# Patient Record
Sex: Female | Born: 1975
Health system: Southern US, Community
[De-identification: ages and names within clinical notes are randomized; demographics above are authoritative.]

## PROBLEM LIST (undated history)

## (undated) DIAGNOSIS — L0292 Furuncle, unspecified: Secondary | ICD-10-CM

## (undated) DIAGNOSIS — Z860100 Personal history of colon polyps, unspecified: Secondary | ICD-10-CM

## (undated) DIAGNOSIS — T753XXA Motion sickness, initial encounter: Secondary | ICD-10-CM

## (undated) DIAGNOSIS — E669 Obesity, unspecified: Secondary | ICD-10-CM

## (undated) DIAGNOSIS — M1990 Unspecified osteoarthritis, unspecified site: Secondary | ICD-10-CM

## (undated) DIAGNOSIS — R42 Dizziness and giddiness: Secondary | ICD-10-CM

## (undated) DIAGNOSIS — Z8669 Personal history of other diseases of the nervous system and sense organs: Secondary | ICD-10-CM

## (undated) DIAGNOSIS — K648 Other hemorrhoids: Secondary | ICD-10-CM

## (undated) DIAGNOSIS — Z9289 Personal history of other medical treatment: Secondary | ICD-10-CM

## (undated) DIAGNOSIS — R110 Nausea: Secondary | ICD-10-CM

## (undated) DIAGNOSIS — Z87440 Personal history of urinary (tract) infections: Secondary | ICD-10-CM

## (undated) DIAGNOSIS — R34 Anuria and oliguria: Secondary | ICD-10-CM

## (undated) DIAGNOSIS — J189 Pneumonia, unspecified organism: Secondary | ICD-10-CM

## (undated) DIAGNOSIS — F419 Anxiety disorder, unspecified: Secondary | ICD-10-CM

## (undated) DIAGNOSIS — K289 Gastrojejunal ulcer, unspecified as acute or chronic, without hemorrhage or perforation: Secondary | ICD-10-CM

## (undated) DIAGNOSIS — R112 Nausea with vomiting, unspecified: Secondary | ICD-10-CM

## (undated) DIAGNOSIS — R519 Headache, unspecified: Secondary | ICD-10-CM

## (undated) DIAGNOSIS — M2550 Pain in unspecified joint: Secondary | ICD-10-CM

## (undated) DIAGNOSIS — K449 Diaphragmatic hernia without obstruction or gangrene: Secondary | ICD-10-CM

## (undated) DIAGNOSIS — K602 Anal fissure, unspecified: Secondary | ICD-10-CM

## (undated) DIAGNOSIS — J45909 Unspecified asthma, uncomplicated: Secondary | ICD-10-CM

## (undated) DIAGNOSIS — A498 Other bacterial infections of unspecified site: Secondary | ICD-10-CM

## (undated) DIAGNOSIS — K5150 Left sided colitis without complications: Secondary | ICD-10-CM

## (undated) DIAGNOSIS — G5601 Carpal tunnel syndrome, right upper limb: Secondary | ICD-10-CM

## (undated) DIAGNOSIS — J40 Bronchitis, not specified as acute or chronic: Secondary | ICD-10-CM

## (undated) DIAGNOSIS — K5190 Ulcerative colitis, unspecified, without complications: Secondary | ICD-10-CM

## (undated) DIAGNOSIS — R51 Headache: Secondary | ICD-10-CM

## (undated) DIAGNOSIS — E860 Dehydration: Secondary | ICD-10-CM

## (undated) DIAGNOSIS — K644 Residual hemorrhoidal skin tags: Secondary | ICD-10-CM

## (undated) DIAGNOSIS — D509 Iron deficiency anemia, unspecified: Secondary | ICD-10-CM

## (undated) DIAGNOSIS — Z8709 Personal history of other diseases of the respiratory system: Secondary | ICD-10-CM

## (undated) DIAGNOSIS — Z9884 Bariatric surgery status: Secondary | ICD-10-CM

## (undated) DIAGNOSIS — F329 Major depressive disorder, single episode, unspecified: Secondary | ICD-10-CM

## (undated) DIAGNOSIS — Z9889 Other specified postprocedural states: Secondary | ICD-10-CM

## (undated) DIAGNOSIS — Z8719 Personal history of other diseases of the digestive system: Secondary | ICD-10-CM

## (undated) DIAGNOSIS — Z8489 Family history of other specified conditions: Secondary | ICD-10-CM

## (undated) DIAGNOSIS — K219 Gastro-esophageal reflux disease without esophagitis: Secondary | ICD-10-CM

## (undated) DIAGNOSIS — K529 Noninfective gastroenteritis and colitis, unspecified: Secondary | ICD-10-CM

## (undated) DIAGNOSIS — Z8711 Personal history of peptic ulcer disease: Secondary | ICD-10-CM

## (undated) DIAGNOSIS — R202 Paresthesia of skin: Secondary | ICD-10-CM

## (undated) DIAGNOSIS — G43909 Migraine, unspecified, not intractable, without status migrainosus: Secondary | ICD-10-CM

## (undated) DIAGNOSIS — M2540 Effusion, unspecified joint: Secondary | ICD-10-CM

## (undated) DIAGNOSIS — F17200 Nicotine dependence, unspecified, uncomplicated: Secondary | ICD-10-CM

## (undated) DIAGNOSIS — F32A Depression, unspecified: Secondary | ICD-10-CM

## (undated) DIAGNOSIS — R251 Tremor, unspecified: Secondary | ICD-10-CM

## (undated) DIAGNOSIS — G629 Polyneuropathy, unspecified: Secondary | ICD-10-CM

## (undated) HISTORY — DX: Obesity, unspecified: E66.9

## (undated) HISTORY — DX: Anal fissure, unspecified: K60.2

## (undated) HISTORY — DX: Unspecified osteoarthritis, unspecified site: M19.90

## (undated) HISTORY — DX: Other bacterial infections of unspecified site: A49.8

## (undated) HISTORY — PX: HEMORRHOIDECTOMY WITH HEMORRHOID BANDING: SHX5633

## (undated) HISTORY — PX: ABDOMINAL HYSTERECTOMY: SHX81

## (undated) HISTORY — DX: Tremor, unspecified: R25.1

## (undated) HISTORY — DX: Iron deficiency anemia, unspecified: D50.9

## (undated) HISTORY — DX: Noninfective gastroenteritis and colitis, unspecified: K52.9

## (undated) HISTORY — DX: Left sided colitis without complications: K51.50

## (undated) HISTORY — DX: Headache: R51

## (undated) HISTORY — DX: Migraine, unspecified, not intractable, without status migrainosus: G43.909

## (undated) HISTORY — DX: Nicotine dependence, unspecified, uncomplicated: F17.200

## (undated) HISTORY — PX: TUBAL LIGATION: SHX77

## (undated) HISTORY — PX: DILATION AND CURETTAGE OF UTERUS: SHX78

## (undated) HISTORY — PX: OTHER SURGICAL HISTORY: SHX169

## (undated) HISTORY — PX: FOOT SURGERY: SHX648

## (undated) HISTORY — DX: Gastrojejunal ulcer, unspecified as acute or chronic, without hemorrhage or perforation: K28.9

---
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
## 2000-12-01 ENCOUNTER — Emergency Department (HOSPITAL_COMMUNITY): Admission: EM | Admit: 2000-12-01 | Discharge: 2000-12-01 | Payer: Self-pay | Admitting: *Deleted

## 2001-05-27 ENCOUNTER — Other Ambulatory Visit: Admission: RE | Admit: 2001-05-27 | Discharge: 2001-05-27 | Payer: Self-pay | Admitting: Family Medicine

## 2001-05-30 ENCOUNTER — Emergency Department (HOSPITAL_COMMUNITY): Admission: EM | Admit: 2001-05-30 | Discharge: 2001-05-31 | Payer: Self-pay | Admitting: Emergency Medicine

## 2001-12-13 ENCOUNTER — Other Ambulatory Visit: Admission: RE | Admit: 2001-12-13 | Discharge: 2001-12-13 | Payer: Self-pay | Admitting: Obstetrics and Gynecology

## 2002-07-01 ENCOUNTER — Emergency Department (HOSPITAL_COMMUNITY): Admission: EM | Admit: 2002-07-01 | Discharge: 2002-07-01 | Payer: Self-pay

## 2002-12-22 ENCOUNTER — Emergency Department (HOSPITAL_COMMUNITY): Admission: EM | Admit: 2002-12-22 | Discharge: 2002-12-22 | Payer: Self-pay

## 2003-08-24 ENCOUNTER — Emergency Department (HOSPITAL_COMMUNITY): Admission: AD | Admit: 2003-08-24 | Discharge: 2003-08-24 | Payer: Self-pay | Admitting: Family Medicine

## 2003-11-12 ENCOUNTER — Emergency Department (HOSPITAL_COMMUNITY): Admission: EM | Admit: 2003-11-12 | Discharge: 2003-11-12 | Payer: Self-pay | Admitting: Emergency Medicine

## 2003-11-15 ENCOUNTER — Encounter: Admission: RE | Admit: 2003-11-15 | Discharge: 2003-11-15 | Payer: Self-pay | Admitting: General Practice

## 2003-11-20 ENCOUNTER — Encounter: Admission: RE | Admit: 2003-11-20 | Discharge: 2003-12-07 | Payer: Self-pay | Admitting: General Practice

## 2004-02-18 ENCOUNTER — Ambulatory Visit (HOSPITAL_COMMUNITY): Admission: RE | Admit: 2004-02-18 | Discharge: 2004-02-18 | Payer: Self-pay | Admitting: Obstetrics and Gynecology

## 2004-02-26 ENCOUNTER — Ambulatory Visit (HOSPITAL_COMMUNITY): Admission: RE | Admit: 2004-02-26 | Discharge: 2004-02-26 | Payer: Self-pay | Admitting: Obstetrics and Gynecology

## 2004-03-04 ENCOUNTER — Inpatient Hospital Stay (HOSPITAL_COMMUNITY): Admission: AD | Admit: 2004-03-04 | Discharge: 2004-03-04 | Payer: Self-pay | Admitting: Obstetrics and Gynecology

## 2004-05-05 ENCOUNTER — Ambulatory Visit (HOSPITAL_COMMUNITY): Admission: RE | Admit: 2004-05-05 | Discharge: 2004-05-05 | Payer: Self-pay | Admitting: Obstetrics and Gynecology

## 2004-07-18 ENCOUNTER — Inpatient Hospital Stay (HOSPITAL_COMMUNITY): Admission: AD | Admit: 2004-07-18 | Discharge: 2004-07-18 | Payer: Self-pay | Admitting: Obstetrics and Gynecology

## 2004-07-22 ENCOUNTER — Inpatient Hospital Stay (HOSPITAL_COMMUNITY): Admission: AD | Admit: 2004-07-22 | Discharge: 2004-07-22 | Payer: Self-pay | Admitting: *Deleted

## 2004-08-12 ENCOUNTER — Inpatient Hospital Stay (HOSPITAL_COMMUNITY): Admission: AD | Admit: 2004-08-12 | Discharge: 2004-08-12 | Payer: Self-pay | Admitting: Obstetrics and Gynecology

## 2004-09-22 ENCOUNTER — Inpatient Hospital Stay (HOSPITAL_COMMUNITY): Admission: AD | Admit: 2004-09-22 | Discharge: 2004-09-22 | Payer: Self-pay | Admitting: Obstetrics and Gynecology

## 2004-09-26 ENCOUNTER — Inpatient Hospital Stay (HOSPITAL_COMMUNITY): Admission: AD | Admit: 2004-09-26 | Discharge: 2004-09-28 | Payer: Self-pay | Admitting: Obstetrics and Gynecology

## 2004-10-05 ENCOUNTER — Inpatient Hospital Stay (HOSPITAL_COMMUNITY): Admission: AD | Admit: 2004-10-05 | Discharge: 2004-10-05 | Payer: Self-pay | Admitting: Obstetrics and Gynecology

## 2004-10-12 ENCOUNTER — Emergency Department (HOSPITAL_COMMUNITY): Admission: EM | Admit: 2004-10-12 | Discharge: 2004-10-12 | Payer: Self-pay | Admitting: Family Medicine

## 2004-10-13 ENCOUNTER — Inpatient Hospital Stay (HOSPITAL_COMMUNITY): Admission: AD | Admit: 2004-10-13 | Discharge: 2004-10-16 | Payer: Self-pay | Admitting: Obstetrics & Gynecology

## 2004-10-18 ENCOUNTER — Observation Stay (HOSPITAL_COMMUNITY): Admission: AD | Admit: 2004-10-18 | Discharge: 2004-10-18 | Payer: Self-pay | Admitting: General Surgery

## 2004-10-18 ENCOUNTER — Encounter (INDEPENDENT_AMBULATORY_CARE_PROVIDER_SITE_OTHER): Payer: Self-pay | Admitting: *Deleted

## 2004-10-19 ENCOUNTER — Emergency Department (HOSPITAL_COMMUNITY): Admission: EM | Admit: 2004-10-19 | Discharge: 2004-10-19 | Payer: Self-pay | Admitting: Emergency Medicine

## 2005-06-26 ENCOUNTER — Inpatient Hospital Stay (HOSPITAL_COMMUNITY): Admission: AD | Admit: 2005-06-26 | Discharge: 2005-06-26 | Payer: Self-pay | Admitting: *Deleted

## 2005-08-18 ENCOUNTER — Other Ambulatory Visit: Admission: RE | Admit: 2005-08-18 | Discharge: 2005-08-18 | Payer: Self-pay | Admitting: Obstetrics and Gynecology

## 2005-09-28 HISTORY — PX: COLONOSCOPY: SHX174

## 2005-10-06 ENCOUNTER — Inpatient Hospital Stay (HOSPITAL_COMMUNITY): Admission: AD | Admit: 2005-10-06 | Discharge: 2005-10-06 | Payer: Self-pay | Admitting: *Deleted

## 2006-01-18 ENCOUNTER — Inpatient Hospital Stay (HOSPITAL_COMMUNITY): Admission: AD | Admit: 2006-01-18 | Discharge: 2006-01-18 | Payer: Self-pay | Admitting: Obstetrics and Gynecology

## 2006-02-05 ENCOUNTER — Inpatient Hospital Stay (HOSPITAL_COMMUNITY): Admission: AD | Admit: 2006-02-05 | Discharge: 2006-02-05 | Payer: Self-pay | Admitting: Obstetrics and Gynecology

## 2006-02-21 ENCOUNTER — Inpatient Hospital Stay (HOSPITAL_COMMUNITY): Admission: AD | Admit: 2006-02-21 | Discharge: 2006-02-21 | Payer: Self-pay | Admitting: Gynecology

## 2006-02-28 ENCOUNTER — Inpatient Hospital Stay (HOSPITAL_COMMUNITY): Admission: AD | Admit: 2006-02-28 | Discharge: 2006-03-02 | Payer: Self-pay | Admitting: Obstetrics and Gynecology

## 2006-06-01 ENCOUNTER — Ambulatory Visit: Payer: Self-pay | Admitting: Internal Medicine

## 2006-06-14 ENCOUNTER — Ambulatory Visit: Payer: Self-pay | Admitting: Internal Medicine

## 2006-06-14 ENCOUNTER — Encounter (INDEPENDENT_AMBULATORY_CARE_PROVIDER_SITE_OTHER): Payer: Self-pay | Admitting: Specialist

## 2007-01-02 ENCOUNTER — Emergency Department (HOSPITAL_COMMUNITY): Admission: EM | Admit: 2007-01-02 | Discharge: 2007-01-02 | Payer: Self-pay | Admitting: Emergency Medicine

## 2007-01-24 ENCOUNTER — Emergency Department (HOSPITAL_COMMUNITY): Admission: EM | Admit: 2007-01-24 | Discharge: 2007-01-24 | Payer: Self-pay | Admitting: Family Medicine

## 2007-06-02 ENCOUNTER — Emergency Department (HOSPITAL_COMMUNITY): Admission: EM | Admit: 2007-06-02 | Discharge: 2007-06-02 | Payer: Self-pay | Admitting: Emergency Medicine

## 2007-10-11 ENCOUNTER — Ambulatory Visit (HOSPITAL_BASED_OUTPATIENT_CLINIC_OR_DEPARTMENT_OTHER): Admission: RE | Admit: 2007-10-11 | Discharge: 2007-10-11 | Payer: Self-pay | Admitting: Orthopedic Surgery

## 2007-10-28 ENCOUNTER — Ambulatory Visit: Payer: Self-pay | Admitting: Internal Medicine

## 2007-10-29 ENCOUNTER — Encounter (INDEPENDENT_AMBULATORY_CARE_PROVIDER_SITE_OTHER): Payer: Self-pay | Admitting: Internal Medicine

## 2007-10-31 ENCOUNTER — Ambulatory Visit: Payer: Self-pay | Admitting: Internal Medicine

## 2007-11-15 ENCOUNTER — Ambulatory Visit: Payer: Self-pay | Admitting: Internal Medicine

## 2007-11-15 DIAGNOSIS — K648 Other hemorrhoids: Secondary | ICD-10-CM | POA: Insufficient documentation

## 2007-12-22 ENCOUNTER — Ambulatory Visit: Payer: Self-pay | Admitting: Nurse Practitioner

## 2008-01-06 ENCOUNTER — Emergency Department (HOSPITAL_COMMUNITY): Admission: EM | Admit: 2008-01-06 | Discharge: 2008-01-06 | Payer: Self-pay | Admitting: Emergency Medicine

## 2008-01-13 ENCOUNTER — Other Ambulatory Visit: Admission: RE | Admit: 2008-01-13 | Discharge: 2008-01-13 | Payer: Self-pay | Admitting: Family Medicine

## 2008-01-13 ENCOUNTER — Ambulatory Visit: Payer: Self-pay | Admitting: Internal Medicine

## 2008-01-13 ENCOUNTER — Encounter (INDEPENDENT_AMBULATORY_CARE_PROVIDER_SITE_OTHER): Payer: Self-pay | Admitting: Nurse Practitioner

## 2008-02-08 ENCOUNTER — Ambulatory Visit: Payer: Self-pay | Admitting: Internal Medicine

## 2008-02-08 ENCOUNTER — Encounter (INDEPENDENT_AMBULATORY_CARE_PROVIDER_SITE_OTHER): Payer: Self-pay | Admitting: Family Medicine

## 2008-02-08 LAB — CONVERTED CEMR LAB
AST: 12 units/L (ref 0–37)
BUN: 13 mg/dL (ref 6–23)
Basophils Relative: 0 % (ref 0–1)
CO2: 19 meq/L (ref 19–32)
Calcium: 9.3 mg/dL (ref 8.4–10.5)
Chlamydia, Swab/Urine, PCR: POSITIVE — AB
Chloride: 106 meq/L (ref 96–112)
Cholesterol: 156 mg/dL (ref 0–200)
Creatinine, Ser: 0.84 mg/dL (ref 0.40–1.20)
HDL: 46 mg/dL (ref >39)
Hemoglobin: 13 g/dL (ref 12.0–15.0)
MCHC: 31.6 g/dL (ref 30.0–36.0)
MCV: 93.6 fL (ref 78.0–100.0)
Monocytes Absolute: 0.5 10*3/uL (ref 0.1–1.0)
Monocytes Relative: 4 % (ref 3–12)
Neutro Abs: 7.2 10*3/uL (ref 1.7–7.7)
RBC: 4.4 M/uL (ref 3.87–5.11)
Total CHOL/HDL Ratio: 3.4
Triglycerides: 68 mg/dL (ref <150)

## 2008-02-09 ENCOUNTER — Inpatient Hospital Stay (HOSPITAL_COMMUNITY): Admission: AD | Admit: 2008-02-09 | Discharge: 2008-02-09 | Payer: Self-pay | Admitting: Obstetrics & Gynecology

## 2008-02-13 ENCOUNTER — Inpatient Hospital Stay (HOSPITAL_COMMUNITY): Admission: AD | Admit: 2008-02-13 | Discharge: 2008-02-13 | Payer: Self-pay | Admitting: Obstetrics & Gynecology

## 2008-03-08 ENCOUNTER — Ambulatory Visit: Payer: Self-pay | Admitting: Gynecology

## 2008-04-01 ENCOUNTER — Emergency Department (HOSPITAL_COMMUNITY): Admission: EM | Admit: 2008-04-01 | Discharge: 2008-04-01 | Payer: Self-pay | Admitting: Emergency Medicine

## 2008-04-06 ENCOUNTER — Ambulatory Visit: Payer: Self-pay | Admitting: Internal Medicine

## 2008-08-25 ENCOUNTER — Emergency Department (HOSPITAL_COMMUNITY): Admission: EM | Admit: 2008-08-25 | Discharge: 2008-08-25 | Payer: Self-pay | Admitting: Emergency Medicine

## 2008-08-27 ENCOUNTER — Emergency Department (HOSPITAL_COMMUNITY): Admission: EM | Admit: 2008-08-27 | Discharge: 2008-08-27 | Payer: Self-pay | Admitting: Emergency Medicine

## 2008-10-05 ENCOUNTER — Inpatient Hospital Stay (HOSPITAL_COMMUNITY): Admission: AD | Admit: 2008-10-05 | Discharge: 2008-10-05 | Payer: Self-pay | Admitting: Obstetrics and Gynecology

## 2009-04-17 ENCOUNTER — Emergency Department (HOSPITAL_COMMUNITY): Admission: EM | Admit: 2009-04-17 | Discharge: 2009-04-17 | Payer: Self-pay | Admitting: Emergency Medicine

## 2009-05-06 ENCOUNTER — Emergency Department (HOSPITAL_COMMUNITY): Admission: EM | Admit: 2009-05-06 | Discharge: 2009-05-06 | Payer: Self-pay | Admitting: Emergency Medicine

## 2009-06-28 ENCOUNTER — Emergency Department (HOSPITAL_COMMUNITY): Admission: EM | Admit: 2009-06-28 | Discharge: 2009-06-28 | Payer: Self-pay | Admitting: Emergency Medicine

## 2009-09-17 ENCOUNTER — Emergency Department (HOSPITAL_COMMUNITY): Admission: EM | Admit: 2009-09-17 | Discharge: 2009-09-17 | Payer: Self-pay | Admitting: Emergency Medicine

## 2009-10-02 ENCOUNTER — Encounter (INDEPENDENT_AMBULATORY_CARE_PROVIDER_SITE_OTHER): Payer: Self-pay | Admitting: Family Medicine

## 2009-10-02 ENCOUNTER — Ambulatory Visit: Payer: Self-pay | Admitting: Internal Medicine

## 2009-10-02 DIAGNOSIS — J069 Acute upper respiratory infection, unspecified: Secondary | ICD-10-CM | POA: Insufficient documentation

## 2009-10-02 DIAGNOSIS — Z8719 Personal history of other diseases of the digestive system: Secondary | ICD-10-CM | POA: Insufficient documentation

## 2009-10-02 DIAGNOSIS — F17200 Nicotine dependence, unspecified, uncomplicated: Secondary | ICD-10-CM

## 2009-10-02 DIAGNOSIS — F329 Major depressive disorder, single episode, unspecified: Secondary | ICD-10-CM | POA: Insufficient documentation

## 2009-10-02 HISTORY — DX: Nicotine dependence, unspecified, uncomplicated: F17.200

## 2009-11-11 ENCOUNTER — Ambulatory Visit: Payer: Self-pay | Admitting: Internal Medicine

## 2009-11-11 ENCOUNTER — Other Ambulatory Visit: Admission: RE | Admit: 2009-11-11 | Discharge: 2009-11-11 | Payer: Self-pay | Admitting: Family Medicine

## 2009-11-11 LAB — CONVERTED CEMR LAB
Chlamydia, DNA Probe: NEGATIVE
GC Probe Amp, Genital: NEGATIVE

## 2009-11-21 ENCOUNTER — Emergency Department (HOSPITAL_COMMUNITY): Admission: EM | Admit: 2009-11-21 | Discharge: 2009-11-21 | Payer: Self-pay | Admitting: Emergency Medicine

## 2010-02-22 ENCOUNTER — Emergency Department (HOSPITAL_COMMUNITY): Admission: EM | Admit: 2010-02-22 | Discharge: 2010-02-22 | Payer: Self-pay | Admitting: Family Medicine

## 2010-04-01 ENCOUNTER — Emergency Department (HOSPITAL_COMMUNITY): Admission: EM | Admit: 2010-04-01 | Discharge: 2010-04-01 | Payer: Self-pay | Admitting: Emergency Medicine

## 2010-09-28 HISTORY — PX: NOVASURE ABLATION: SHX5394

## 2010-10-28 NOTE — Assessment & Plan Note (Signed)
Summary: Office Visit (Infectious Disease)    History of Present Illness: F/u from hospitalization 09/17/2009. Dx'd with URI, no meds given. Today pt reports that she is improved. Currently with no complaints.  Past History:  Past Surgical History: Hemorrhoidectomy 2006 Rise Patience) Colonoscopy 05/2006 (per pt) polyps removed  Review of Systems General:  Denies chills, fatigue, fever, sweats, and weakness. ENT:  Complains of earache. Resp:  Denies chest discomfort, chest pain with inspiration, cough, and wheezing.  Physical Exam  General:  Well-developed,well-nourished,in no acute distress; alert,appropriate and cooperative throughout examination Head:  Normocephalic and atraumatic without obvious abnormalities. No apparent alopecia or balding. Eyes:  No corneal or conjunctival inflammation noted. EOMI. Perrla.  Ears:  External ear exam shows no significant lesions or deformities.  Otoscopic examination reveals clear canals, tympanic membranes are intact bilaterally without bulging, retraction, inflammation or discharge. Hearing is grossly normal bilaterally. Nose:  External nasal examination shows no deformity or inflammation. Nasal mucosa are pink and moist without lesions or exudates. Mouth:  Oral mucosa and oropharynx without lesions or exudates.  Teeth in poor repair. Lungs:  Normal respiratory effort, chest expands symmetrically. Lungs are clear to auscultation, no crackles or wheezes. Heart:  Normal rate and regular rhythm. S1 and S2 normal without gallop, murmur, click, rub or other extra sounds. Abdomen:  Bowel sounds positive,abdomen soft and non-tender without masses, organomegaly or hernias noted.   Impression & Recommendations:  Problem # 1:  UPPER RESPIRATORY INFECTION (ICD-465.9) Resolved. f/u from ED, no Abx.  Problem # 2:  HEMATOCHEZIA, HX OF (ICD-V12.79) request colonoscopy records from 2007. pt states had polyps and GI wanted f/u, but she never did hemorrhoid  surgery in 2006, no recent constipation or bleeding.  Problem # 3:  OBESITY, UNSPECIFIED (ICD-278.00) Healthy diet and exercise encouraged.  Problem # 4:  TOBACCO USER (ICD-305.1) Advised quitting.  Problem # 5:  DEPRESSION, MILD (ICD-311) Along with anxiety, seeing counsellor

## 2010-11-01 ENCOUNTER — Emergency Department (HOSPITAL_COMMUNITY): Payer: Medicaid Other

## 2010-11-01 ENCOUNTER — Emergency Department (HOSPITAL_COMMUNITY)
Admission: EM | Admit: 2010-11-01 | Discharge: 2010-11-01 | Disposition: A | Discharge status: Home / Self Care | Payer: Medicaid Other | Attending: Emergency Medicine | Admitting: Emergency Medicine

## 2010-11-01 DIAGNOSIS — R059 Cough, unspecified: Secondary | ICD-10-CM | POA: Insufficient documentation

## 2010-11-01 DIAGNOSIS — F17200 Nicotine dependence, unspecified, uncomplicated: Secondary | ICD-10-CM | POA: Insufficient documentation

## 2010-11-01 DIAGNOSIS — R6883 Chills (without fever): Secondary | ICD-10-CM | POA: Insufficient documentation

## 2010-11-01 DIAGNOSIS — R05 Cough: Secondary | ICD-10-CM | POA: Insufficient documentation

## 2010-11-01 DIAGNOSIS — J3489 Other specified disorders of nose and nasal sinuses: Secondary | ICD-10-CM | POA: Insufficient documentation

## 2010-11-01 DIAGNOSIS — J029 Acute pharyngitis, unspecified: Secondary | ICD-10-CM | POA: Insufficient documentation

## 2010-11-01 LAB — DIFFERENTIAL
Basophils Absolute: 0 10*3/uL (ref 0.0–0.1)
Basophils Relative: 0 % (ref 0–1)
Neutro Abs: 7.8 10*3/uL — ABNORMAL HIGH (ref 1.7–7.7)
Neutrophils Relative %: 63 % (ref 43–77)

## 2010-11-01 LAB — RAPID STREP SCREEN (MED CTR MEBANE ONLY): Streptococcus, Group A Screen (Direct): NEGATIVE

## 2010-11-01 LAB — CBC
Hemoglobin: 11.6 g/dL — ABNORMAL LOW (ref 12.0–15.0)
Platelets: 331 10*3/uL (ref 150–400)
RBC: 4.2 MIL/uL (ref 3.87–5.11)

## 2010-11-09 ENCOUNTER — Emergency Department (HOSPITAL_COMMUNITY)
Admission: EM | Admit: 2010-11-09 | Discharge: 2010-11-09 | Disposition: A | Discharge status: Home / Self Care | Payer: Medicaid Other | Attending: Emergency Medicine | Admitting: Emergency Medicine

## 2010-11-09 DIAGNOSIS — R51 Headache: Secondary | ICD-10-CM | POA: Insufficient documentation

## 2010-11-09 DIAGNOSIS — R05 Cough: Secondary | ICD-10-CM | POA: Insufficient documentation

## 2010-11-09 DIAGNOSIS — F17200 Nicotine dependence, unspecified, uncomplicated: Secondary | ICD-10-CM | POA: Insufficient documentation

## 2010-11-09 DIAGNOSIS — R1110 Vomiting, unspecified: Secondary | ICD-10-CM | POA: Insufficient documentation

## 2010-11-09 DIAGNOSIS — J069 Acute upper respiratory infection, unspecified: Secondary | ICD-10-CM | POA: Insufficient documentation

## 2010-11-09 DIAGNOSIS — R059 Cough, unspecified: Secondary | ICD-10-CM | POA: Insufficient documentation

## 2010-12-15 LAB — CULTURE, ROUTINE-ABSCESS
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
## 2011-01-04 LAB — STREP A DNA PROBE: Group A Strep Probe: NEGATIVE

## 2011-01-04 LAB — POCT I-STAT, CHEM 8
Calcium, Ion: 1.06 mmol/L — ABNORMAL LOW (ref 1.12–1.32)
Chloride: 107 mEq/L (ref 96–112)
HCT: 39 % (ref 36.0–46.0)
Hemoglobin: 13.3 g/dL (ref 12.0–15.0)
Potassium: 4.3 mEq/L (ref 3.5–5.1)

## 2011-01-04 LAB — WET PREP, GENITAL: Yeast Wet Prep HPF POC: NONE SEEN

## 2011-01-04 LAB — GC/CHLAMYDIA PROBE AMP, GENITAL: Chlamydia, DNA Probe: NEGATIVE

## 2011-01-12 LAB — WET PREP, GENITAL
Trich, Wet Prep: NONE SEEN
Yeast Wet Prep HPF POC: NONE SEEN

## 2011-01-12 LAB — GC/CHLAMYDIA PROBE AMP, GENITAL: GC Probe Amp, Genital: NEGATIVE

## 2011-01-12 LAB — URINE CULTURE: Colony Count: >=100000

## 2011-01-12 LAB — URINALYSIS, ROUTINE W REFLEX MICROSCOPIC
Bilirubin Urine: NEGATIVE
Glucose, UA: NEGATIVE mg/dL
Hgb urine dipstick: NEGATIVE
Ketones, ur: NEGATIVE mg/dL
Protein, ur: NEGATIVE mg/dL
Urobilinogen, UA: 0.2 mg/dL (ref 0.0–1.0)

## 2011-02-04 ENCOUNTER — Inpatient Hospital Stay (HOSPITAL_COMMUNITY)
Admission: AD | Admit: 2011-02-04 | Discharge: 2011-02-04 | Disposition: A | Discharge status: Home / Self Care | Payer: Medicaid Other | Source: Ambulatory Visit | Attending: Obstetrics and Gynecology | Admitting: Obstetrics and Gynecology

## 2011-02-04 DIAGNOSIS — N949 Unspecified condition associated with female genital organs and menstrual cycle: Secondary | ICD-10-CM | POA: Insufficient documentation

## 2011-02-04 DIAGNOSIS — B3731 Acute candidiasis of vulva and vagina: Secondary | ICD-10-CM | POA: Insufficient documentation

## 2011-02-04 DIAGNOSIS — B373 Candidiasis of vulva and vagina: Secondary | ICD-10-CM | POA: Insufficient documentation

## 2011-02-04 LAB — URINALYSIS, ROUTINE W REFLEX MICROSCOPIC
Bilirubin Urine: NEGATIVE
Glucose, UA: NEGATIVE mg/dL
Nitrite: NEGATIVE
Specific Gravity, Urine: 1.02 (ref 1.005–1.030)
pH: 6 (ref 5.0–8.0)

## 2011-02-04 LAB — WET PREP, GENITAL: Trich, Wet Prep: NONE SEEN

## 2011-02-04 LAB — POCT PREGNANCY, URINE: Preg Test, Ur: NEGATIVE

## 2011-02-05 LAB — GC/CHLAMYDIA PROBE AMP, GENITAL: Chlamydia, DNA Probe: NEGATIVE

## 2011-02-10 NOTE — Assessment & Plan Note (Signed)
[facility] OFFICE NOTE   NAME:DAVISCassadi, Purdie                    MRN:          [id number]  DATE:11/15/2007                            DOB:          [birth year]    CHIEF COMPLAINT:  Rectal bleeding.   HISTORY:  Ms. Blakley is a lady I know from September 2007 evaluation.  A  colonoscopy revealed internal hemorrhoids.  She has a history of  complicated hemorrhoids having acute urgent surgery for massive swollen  and prolapsed hemorrhoids by Dr. Rise Patience 2006.  She has continued to  have intermittent rectal bleeding, bright red blood with some occasional  dark clots.  Her stools are generally not hard.  She has some pain when  she defecates.  She gets an occasional abdominal cramp.  She is having  some low back pain as well.  She has used some intermittent fiber, but  not regularly.  She had tried some suppositories about a year ago, but  does not think they caused significant help.   MEDICATIONS:  None at this time.   DRUG ALLERGIES:  OXYCODONE AND PENICILLIN.   PAST MEDICAL HISTORY:  1. Obesity.  2. Childbirth.  3. Hemorrhoidectomy as described above.  4. Colonoscopy June 14, 2006 demonstrated some lymphoid      hyperplasia of the rectum with a tiny polyp, and some internal      hemorrhoids.   PHYSICAL EXAMINATION:  Shows a very obese, black woman.  Weight 250  pounds.  Height 5 feet 6-3/4 inches.  Pulse 86, blood pressure 122/82.  ABDOMEN:  Obese.  Inspection of the anoderm shows no particular  abnormalities.  RECTAL:  Exam in the presence of female medical staff shows some mild  anal tenderness and thickening in the anal canal on the finger.  An  anoscopic examination is performed showing hemorrhoids swollen and  congested in the canal, somewhat violaceous in color.  I do not see an  obvious fissure or signs of that.  The tenderness is not consistent with  that in my opinion.   ASSESSMENT:   Complicated internal hemorrhoids with bleeding and pain.  There may be some thrombosis of these hemorrhoids perhaps.  A fissure is  possible but seems unlikely based upon this exam.   PLAN:  1. Anusol HC suppository nightly for 14 nights and then also as needed      after bowel movements.  2. If this fails to provide significant relief, I would ask her to see      Dr. Rise Patience again (will defer      to Dr. Jobe Igo on this).  I think this is about all we can offer at      this time.  She may need a more significant anoscopic evaluation.     Gatha Mayer, MD,FACG  Electronically Signed    CEG/MedQ  DD: 11/15/2007  DT: 11/15/2007  Job #: [id number]   cc:   Monia Sabal. Jobe Igo, M.D.  Orson Ape. Rise Patience, M.D.

## 2011-02-10 NOTE — Group Therapy Note (Signed)
NAMEMURRY, KHIEV NO.:  [id number]   MEDICAL RECORD NO.:  [id number]          PATIENT TYPE:  WOC   LOCATION:  [facility]                   FACILITY:  [facility]   PHYSICIAN:  Willey Blade, MD DATE OF BIRTH:  30-Jun-1976   DATE OF SERVICE:  03/08/2008                                  CLINIC NOTE   [name] returns today following evaluation at the maternity admission  unit on Feb 09, 2008.  She is a 35 year old [place] American female who  presented with pelvic pain with an IUD in place.  Apparently the patient  was treated for chlamydia and the concern was whether she was compliant  with management of her medication.  Her IUD was removed in the MAU and  she was placed on doxycycline 100 mg twice a day for 7 days and Flagyl  500 mg one twice a day for 1 week as well.  The patient returns today  with no discomfort.  She states that in the past approximately 2 years  ago she had repair of a prolapsed rectum and hemorrhoids.  She notices  that when she has her menses she notices rectal bleeding.  She had a  colonoscopy 1 year ago which revealed 1-2 polyps according to the  patient but otherwise no other findings.   PHYSICAL EXAM:  A GC and chlamydia probe performed today.  Cervix smooth  without erosions or lesions.  Uterus small, anteverted and flexed.  Both  adnexa palpable.  No tenderness noted on exam.   PLAN:  The patient was reassured that with a negative colonoscopy 1 year  ago it is unlikely there is any pathology of worry and pertaining to  bleeding with her menses.  In addition, she will be contacted if her  cultures return positive.           ______________________________  Willey Blade, MD     SHB/MEDQ  D:  03/08/2008  T:  03/08/2008  Job:  [id number]

## 2011-02-10 NOTE — Op Note (Signed)
[name], [name]             ACCOUNT NO.:  [id number]   MEDICAL RECORD NO.:  [id number]          PATIENT TYPE:  AMB   LOCATION:  [facility]                          FACILITY:  [facility]   PHYSICIAN:  Weber Cooks, M.D.     DATE OF BIRTH:  [birth year]   DATE OF PROCEDURE:  10/11/2007  DATE OF DISCHARGE:                               OPERATIVE REPORT   PREOPERATIVE DIAGNOSES:  1. Right fifth hammertoe.  2. Left fifth toe lateral aspect of the PIP and DIP joint spurs.   POSTOPERATIVE DIAGNOSES:  1. Right fifth hammertoe.  2. Left fifth toe lateral aspect of the PIP and DIP joint spurs.   OPERATION:  1. Right fifth toe proximal phalanx head resection.  2. Right fifth toe EDL tendon shortening.  3. Left fifth toe excision lateral aspect PIP joint.  4. Left fifth toe excision lateral aspect DIP joint.   ANESTHESIA:  General with block.   SURGEON:  Weber Cooks be seen just   ANESTHESIA:  General.   SURGEON:  Weber Cooks, M.D.   ASSISTANT:  Erskine Emery, P.A.-C.   ESTIMATED BLOOD LOSS:  Minimal.   TOURNIQUET TIME:  Approximately 50 minutes on the right and 0 on the  left.   COMPLICATIONS:  None.   DISPOSITION:  Stable to PR.   INDICATIONS:  This is a 35 year old female who has had longstanding  dorsal lateral bilateral fifth toe pain with painful callus formation  that was interfering with her life despite conservative management.  She  was consented for the above procedure.  All risks including infection,  neurovascular vessel injury, toe deformity, persistent pain, persistent  callus formation and possibility of future surgery were all explained.  Questions were encouraged and answered.   PROCEDURE IN DETAIL:  The patient was taken to the operating room and  placed in the supine position after adequate general endotracheal tube  anesthesia was administered as well as Ancef 1 gram IV piggyback.  The  bilateral lower extremities were prepped and draped in the sterile  manner over proximally placed thigh tourniquets.  We started with the  right side.  The limb was gravity exsanguinated.  The tourniquet was  loaded to 290 mmHg.  A longitudinal incision on the dorsal aspect of the  right fifth toe PIP joint was then made.  The callus was directly on the  dorsal dorsolateral aspects of this joint with the hammertoe deformity.  Dissection was carried down through skin.  Hemostasis was obtained.  We  developed a venous tourniquet and the tourniquet was deflated.  The EDL  tendon was identified and retracted out of harm's way.  Proximal phalanx  head was then exposed and this was then removed with a bone cutter.  The  cut was made perpendicular to the long axis of the proximal phalanx.  The area was copiously irrigated with normal saline.  The EDL tendon was  then shortened and repaired with 4-0 Vicryl stitches.  Had an  Systems analyst.  The toe was in excellent alignment and when  palpating it through the skin this was adequately decompressed over the  calloused area.  The area was copiously irrigated with normal saline.  Skin was closed with 4-0 nylon stitch.   We then went to the left side and the we did not use a tourniquet due to  the fact that we had a venous tourniquet on the contralateral side.  We  made a longitudinal incision on dorsal lateral aspect of the left fifth  toe PIP and DIP joint.  Dissection was carried down through skin.  Hemostasis was obtained.  This toe actually had calluses in different  areas.  They were more over the lateral aspect of the DIP and lateral  aspect of the PIP joints as two almost separate calices.  Once we were  through the skin we could feel distinctly that the lateral aspects of  each joint was prominent and with a rongeur as well as bone cutter the  base of the distal phalanx and the middle phalanx were resected as well  as the lateral aspect of the heads of the middle and proximal phalanx,  respectively.   This was then palpated through the skin and deemed  adequately decompressed.  There was no gross deformity and we did not  need to do anything to the extensor tendon in this toe.  The area was  copiously irrigated with normal saline.  Skin was closed with 4-0 nylon  stitch.  Sterile dressing was applied.  Hard sole shoes were applied.  The patient was stable to the PR.      Weber Cooks, M.D.  Electronically Signed     PB/MEDQ  D:  10/11/2007  T:  10/11/2007  Job:  [id number]

## 2011-02-11 ENCOUNTER — Emergency Department (HOSPITAL_COMMUNITY)
Admission: EM | Admit: 2011-02-11 | Discharge: 2011-02-11 | Disposition: A | Discharge status: Home / Self Care | Payer: Medicaid Other | Attending: Emergency Medicine | Admitting: Emergency Medicine

## 2011-02-11 ENCOUNTER — Emergency Department (HOSPITAL_COMMUNITY): Payer: Medicaid Other

## 2011-02-11 DIAGNOSIS — M79609 Pain in unspecified limb: Secondary | ICD-10-CM | POA: Insufficient documentation

## 2011-02-11 DIAGNOSIS — M5412 Radiculopathy, cervical region: Secondary | ICD-10-CM | POA: Insufficient documentation

## 2011-02-11 DIAGNOSIS — S139XXA Sprain of joints and ligaments of unspecified parts of neck, initial encounter: Secondary | ICD-10-CM | POA: Insufficient documentation

## 2011-02-11 DIAGNOSIS — X500XXA Overexertion from strenuous movement or load, initial encounter: Secondary | ICD-10-CM | POA: Insufficient documentation

## 2011-02-11 LAB — URINALYSIS, ROUTINE W REFLEX MICROSCOPIC
Bilirubin Urine: NEGATIVE
Hgb urine dipstick: NEGATIVE
Specific Gravity, Urine: 1.022 (ref 1.005–1.030)
Urobilinogen, UA: 0.2 mg/dL (ref 0.0–1.0)
pH: 6 (ref 5.0–8.0)

## 2011-02-13 NOTE — Discharge Summary (Signed)
NAMELATIQUA, DALOIA             ACCOUNT NO.:  [id number]   MEDICAL RECORD NO.:  [id number]          PATIENT TYPE:  INP   LOCATION:  9169                           FACILITY:   PHYSICIAN:  Everett Graff, M.D.   DATE OF BIRTH:  Jun 06, 1976   DATE OF ADMISSION:  09/26/2004  DATE OF DISCHARGE:  09/28/2004                                 DISCHARGE SUMMARY   Ms. Sand is a 35 year old, gravida 8, para 2-0-5-1 at 39-0/7 weeks who  presented for elective induction on September 26, 2004 with the reason being  that the patient had loss of a 36-year-old child due to cancer several years  ago with the death of a child around the patient's current due date.  The  patient requested an induction for this reason prior to her due.  Her  pregnancy had been followed by the [facility] and  would be remarkable for:  1.  Positive group B Streptococcus.  2.  Penicillin allergy.  3.  History of death of a child from cancer.  4.  History of postpartum depression.  5.  Pour SAB's, one TAB.  6.  Previous twin gestation, now singleton.  7.  Increased BMI.  8.  First trimester bleeding.  9.  Oxycodone allergy.   She was admitted on September 26, 2004 with cervical exam of 1-2 cm, 60%,  vertex -2, -3.  She has positive group B Streptococcus status and was  started on clindamycin as well as Pitocin for induction.  She continued on  Pitocin throughout that day until 8 p.m. when she reached 26 mil units per  minute with  unchanged cervix.  Fetal heart rate was reactive.  Options were  given including discharge home versus cervical ripening.  The patient  elected Cytotec ripening that took place that night.  The cervix remained  the same, 1 cm, 50-60% effaced, vertex -3.  A BPP was obtained that was 6/8  with no breathing.  AFI was 16.2.  She was just having prodromal  contractions that were not painful throughout the day.  By late afternoon on  December 31, her cervix was 1 cm, 70%,  vertex -3 and continued having latent  contractions.  The plan was discussed with the patient of repeating the BPP  in the morning of January 1 and discharging her home at that point if  appropriate.  The patient slept during the night of December 31 and was  comfortable by the morning of January 1.  Her BPP was 8/8, AFI was 25.6 cm.  Clean catch urinalysis was performed due to dysuria and that was negative.  Cervix was 1 cm, 50%, vertex -3 with no contractions.  Fetal heart rate was  reactive and reassuring.  It was recommended to the patient that she be  discharged home.  She desired Pitocin induction despite the recommendation  and discussed the situation with Dr. Mancel Bale.  She was not willing to  attempt a Pitocin induction another time and this was relayed to the patient  and she was agreeable to the plan of being discharged home.  DISCHARGE INSTRUCTIONS:  Labor instructions were provided to the patient.  Contractions every 3-5 minutes lasting 60 seconds.  Call for suspected  leaking of fluid or vaginal bleeding.   DISCHARGE FOLLOW UP:  Will occur at [facility] on January 2 or  January 3.  The patient is to call as needed.   DISCHARGE MEDICATIONS:  Prenatal vitamins one p.o. q day.     Kimb   KS/MEDQ  D:  09/28/2004  T:  09/28/2004  Job:  [id number]
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
## 2011-02-13 NOTE — H&P (Signed)
NAMEPAYSEN, GOZA             ACCOUNT NO.:  [id number]   MEDICAL RECORD NO.:  [id number]          PATIENT TYPE:  INP   LOCATION:  4128                          FACILITY:  [facility]   PHYSICIAN:  Eldred Manges, M.D.DATE OF BIRTH:  1975/11/10   DATE OF ADMISSION:  10/13/2004  DATE OF DISCHARGE:                                HISTORY & PHYSICAL   This is a 35 year old gravida 72, para 2-0-5-1 at 41-1/7 weeks who presents  for elective induction of labor secondary to post dates. She denies leaking  or bleeding and reports positive fetal movement.  The pregnancy has been  followed by the nurse midwife service and remarkable for:  1.  First trimester bleeding.  2.  Unsure last menstrual period.  3.  Previous twins with one demise.  4.  Multiple abortions.  5.  History of depression.  74.  Death of 58-year-old child secondary to brain cancer.  7.  Group B strep positive.  8.  Penicillin allergic.   ALLERGIES:  Oxycodone causes questionable anaphylactic reaction and  penicillin causes a rash.   OBSTETRICAL HISTORY:  Remarkable for spontaneous abortion in 1993 at [redacted] weeks  gestation.  Spontaneous abortion of twins in 1997 at [redacted] weeks gestation.  Vaginal delivery in 1997 of a female infant at [redacted] weeks gestation, weighing 7  pounds 11 ounces, remarkable for induction secondary to post dates.  An  elective abortion in 1998 at [redacted] weeks gestation.  A vaginal delivery in 1999  of a female infant at [redacted] weeks gestation, weighing 8 pounds 1 ounce,  remarkable for childhood death secondary to brain tumor with cancer.  She  had a spontaneous abortion in 2004 at [redacted] weeks gestation and a spontaneous  abortion in 2005 at [redacted] weeks gestation.   MEDICAL HISTORY:  Remarkable for postpartum depression with previous  pregnancy, rare yeast infection, childhood Varicella, history of  constipation during pregnancy.  History of depression at age 77 treated with  Prozac.  Previous smoker and the patient quit smoking  when she had a  positive pregnancy test.  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   SURGICAL HISTORY:  Remarkable for an elective abortion and wisdom teeth.   FAMILY HISTORY:  Remarkable for grandparents and aunts and uncles with  hypertension.  Grandmother, aunt and uncle with diabetes.  Mother with  migraines and sarcoidosis.   GENETIC HISTORY:  Remarkable for father of the baby's brother born with a  heart tumor which was cancerous.  Paternal first cousin with severe  retardation, a paternal uncle with Autism, father of the baby's mother with  HIV positive.   SOCIAL HISTORY:  The patient is single.  Father of the baby, Alcario Drought is  not currently present but is involved.  The patient works as an in Environmental consultant.  She is of the [place].  She denies any current alcohol,  tobacco or drug use though she did smoke prior to her pregnancy test.   PRENATAL LABORATORIES:  Hemoglobin 12.4, platelets 364,000, blood type O+,  antibody screen negative.  Sickle Cell negative.  RPR nonreactive, Rubella  immune, hepatitis negative.  HIV negative.  Pap normal.  Gonorrhea negative.  Chlamydia negative.  Cystic fibrosis negative.  Group B strep positive at 36  weeks.   HISTORY OF CURRENT PREGNANCY:  The patient entered care at [redacted] weeks  gestation.  She had a quad screen done at 16 weeks.  There are no records  from 16 to 29 weeks.  At 29 weeks, she was seen for preterm labor and a  fall.  Fetal fibronectin was negative and cultures were negative.  She had  an ultrasound at 31 weeks showing seventieth to ninetieth percentile for  growth and normal AFI.  She began requesting induction of labor at 33 weeks  secondary to the anniversary of her previous child's death and increased  worrying about this current baby.  She was exposed to Scabies at 35 weeks  and was treated with Permethrin 5%. She had group B strep positive at 36  weeks and she got induced at 38-1/2 weeks for two days and did not show any   progress during induction and was sent home after that following favorable  BPP's.  Subsequent visits were within normal limits and then she was  scheduled for induction today secondary to being 41 weeks.   OBJECTIVE DATA:  VITAL SIGNS:  Stable.  Afebrile.  HEENT:  Within normal limits.  The patient wears glasses.  NECK:  Thyroid normal, not enlarged.  CHEST:  Clear to auscultation.  HEART:  A regular rate and rhythm.  ABDOMEN:  Gravid at 40 cm, vertex __________ .  Pulse EFM shows fetal heart  rate in the 140's with one variable deceleration with a contraction and one  acceleration to 160.  There has only been one contraction so far.  Cervix is  1 cm, 60% effaced, -3 station with a vertex presentation.  Saline lock is  already in place per R.N.  EXTREMITIES:  Within normal limits.   ASSESSMENT:  1.  Intra-uterine pregnancy at 41-1/7 weeks.  2.  Semi unfavorable cervix with a Bishop score of 4-5.  3.  Failed induction at 38 weeks.  4.  Group B strep positive.   PLAN:  1.  Admit to birthing suites as scheduled for induction of labor.  2.  Cytotec tonight followed by high dose Pitocin in the morning per Jocelyn Lamer      L. Latham, C.N.M.  and clindamycin for group B strep when in active      labor.     Mari   MLW/MEDQ  D:  10/13/2004  T:  10/14/2004  Job:  [id number]

## 2011-02-13 NOTE — Assessment & Plan Note (Signed)
HEALTHCARE                           GASTROENTEROLOGY OFFICE NOTE   [name], [name]                    MRN:          [id number]  DATE:06/01/2006                            DOB:          [birth year]    REFERRING PHYSICIAN:  Everett Graff, M.D.   REASON FOR CONSULTATION:  Bleeding.   ASSESSMENT:  Blood in stool/rectal bleeding in a woman with a history of  previous hemorrhoidectomy.  There are no obvious hemorrhoids on physical  exam today.  She has changing bowel habits with alternating constipation and  diarrhea.   PLAN:  1. Schedule colonoscopy to investigate.  Risks, benefits, and indications      are discussed.  2. Check a TSH and CBC today.  3. Benefiber samples are given to the patient with advice to take that      once a day to see if it helps her alternating bowel habits.  4. She is instructed to quit smoking.  5. She is breast-feeding so she will pump and discard her breast milk for      8 to 12 hours after her colonoscopy prior to feeding her child again.   HISTORY:  A 35 year old, African-American woman who developed severe  problems with mixed hemorrhoids last year and had a rather urgent  hemorrhoidectomy due to massive swollen and prolapsed hemorrhoids.  I have  reviewed the operative note; Dr. Rise Patience was her surgeon.  Subsequent to  that, she had another child about 3 months ago.  However, prior to her  problems and her delivery in 2006 and subsequent to her hemorrhoidectomy,  she has continued to have blood on the tissue paper plus blood mixed in with  the stool at times, which is both bright red and darker.  Her bowel habits  tend to alternate between loose and hard, though she does not strain  significantly.  She does move her bowels every day.  She read somewhere  where smoking [place] cigarettes helps with defecation, so she has been  smoking about 2 or 3 of those a day.  GI review of systems is otherwise  negative.   PAST MEDICAL HISTORY:  Notable for hemorrhoid surgery.   FAMILY HISTORY:  Diabetes in some second-degree relatives.  Sarcoidosis in  her mother.  No colon cancer.   SOCIAL HISTORY:  She is single.  She has 3 sons.  She is college educated.  Smoking as above.  No alcohol or drug use.   REVIEW OF SYSTEMS:  Otherwise unremarkable.   PHYSICAL EXAMINATION:  VITAL SIGNS:  Height 5 feet, 7 inches.  Weight 273  pounds.  Blood pressure 112/70.  Pulse 64.  GENERAL:  Obese, pleasant, black woman in no acute distress.  EYES:  Anicteric.  ENT:  Normal mouth and posterior pharynx.  NECK:  Supple supple without masses.  CHEST:  Clear.  HEART:  S1, S2.  No gallops.  ABDOMEN:  Obese, soft, nontender without organomegaly or mass.  RECTAL:  Rectal exam in the presence of female nursing staff shows no stool.  No hemorrhoids are obvious.  She is nontender.  No masses and  no skin tags.  No fissure.  LYMPHATIC:  No neck or supraclavicular nodes.  EXTREMITIES:  No edema.  SKIN:  No rash.  PSYCHIATRIC:  She is alert and oriented x3.   I reviewed office notes from Dr. Mancel Bale as well as the operative notes from  2006 as noted in Dr. Cheral Bay follow-up note.   Her hemoglobin was 12 in July of this year.                                   Gatha Mayer, MD,FACG   CEG/MedQ  DD:  06/01/2006  DT:  06/02/2006  Job #:  [id number]   cc:   Everett Graff, M.D.

## 2011-02-13 NOTE — Op Note (Signed)
NAMETENZIN, PAVON             ACCOUNT NO.:  [id number]   MEDICAL RECORD NO.:  [id number]          PATIENT TYPE:  OBV   LOCATION:  2993                         FACILITY:  [facility]   PHYSICIAN:  Orson Ape. Weatherly, M.D.DATE OF BIRTH:  06/16/1976   DATE OF PROCEDURE:  10/18/2004  DATE OF DISCHARGE:  10/18/2004                                 OPERATIVE REPORT   PREOPERATIVE DIAGNOSIS:  Prolapsed, thrombosed, acutely inflamed internal  and external hemorrhoids, four days postpartum.   POSTOPERATIVE DIAGNOSIS:  Prolapsed, thrombosed, acutely inflamed internal  and external hemorrhoids, four days postpartum.   ANESTHESIA:  General.   OPERATION/PROCEDURE:  Internal and external hemorrhoidectomy x3.   SURGEON:  Orson Ape. Rise Patience, M.D.   HISTORY:  [name] is a 35 year old female who returned to [facility] today, four days postpartum, with severe, thrombosed, prolapsed  hemorrhoids that have been very painful and bleeding.  The patient states  that she has had some problems with her hemorrhoids during her pregnancy but  about two days after she delivered, she started having massive swelling,  more bleeding and with increasing pain, presented back to the emergency room  at [facility] today and was seen by the physician.  They called and I  saw her and on exam she has massive prolapsed, thrombosed, internal and  external hemorrhoids, about the size of the small fist, more so on the right  than left side, and was too tender to do anything local.  She had eaten  chicken prior to coming to the emergency room and we waited six hours and  now planning an urgent hemorrhoidectomy and I had her transferred over to  [facility], being on call and with our instruments available here for this  procedure.  The patient and her husband understand the proposed procedure  and signed a permit.  She was given Reglan and Pepcid intravenously  preoperatively and also given 400 mg of  Cipro.   DESCRIPTION OF PROCEDURE:  Induction of general anesthesia endotracheal tube  and then was placed in __________ stirrups.  She weighs probably about 235  pounds and in this position the hemorrhoids were as described above.  First  the anus and perirectal area was prepped with Betadine solution and draped  in a sterile manner.  I had washed out the inside of her rectum but  fortunately she had very little stool within her rectum.   First, about 20 mL of 0.5% Marcaine with adrenalin was used to anesthetize  the sphincter in both left and right quadrants and then with her in a  Trendelenburg position, I elected to excise the anterior one which was  probably the largest first.  The hemorrhoid was elevated, excising the  external portion and the internal portion.  The sphincter was identified and  protected and then the pedicle, after it was elevated off the internal  sphincter, was under clamped with a Bowie clamp and then this was sutured  with two figure-of-eight sutures of 2-0 chromic and then suturing over the  clamp and then after about a third of the internal portion had been  sutured,  the clamp removed and the external portion of the hemorrhoid was closed.  There was a little bleeding up higher that was sutured with 2-0 chromic for  good hemostasis.  Next, a similar but slightly smaller right lateral  posterior hemorrhoid was elevated over a similar manner and the pedicle was  sutured with 2-0 chromic and then sutured over the clamp, the clamp removed,  and then the external portion of the hemorrhoidectomy incision closed with  running 2-0 chromic.  The left lateral hemorrhoid was not as large but  thrombosed within the __________ internal portion and this was elevated and  a smaller amount of tissue removed and then closed in a similar manner.  It  was necessary to put a few extra sutures because of the immediate postpartum  engorgement but there was good __________ . All  three incisions appeared dry  at the completion of surgery.  There was a small thrombus in a little sister  hemorrhoid posterolateral left that I did not remove since it is really  close to two suture lines and did not __________ denuded in this and this  should resolve.  The Xylocaine ointment was then applied within the anal  canal but there was not a diagnostic anoscope available to inspect all three  suture lines together and then a piece of rolled up Gelfoam was placed in  the anal canal and the surgery terminated.  The patient awakened promptly  and was to the recovery room.   We will discuss with her husband whether they spend the night and go home in  the morning or possibly go home later this evening.  We have made  arrangements for her husband ___________ because of the urgent nature of her  problems and they understand that if she does go home, an adult will need to  be with her for the next 24 hours.  The patient was in the recovery room,  awake, and denied any significant pain in the immediate postoperative  period.      WJW/MEDQ  D:  10/18/2004  T:  10/19/2004  Job:  [id number]

## 2011-02-13 NOTE — H&P (Signed)
[name], [name]             ACCOUNT NO.:  [id number]   MEDICAL RECORD NO.:  [id number]          PATIENT TYPE:  INP   LOCATION:  9133                          FACILITY:  [place]   PHYSICIAN:  Dede Query. Rivard, M.D. DATE OF BIRTH:  [birth year]   DATE OF ADMISSION:  02/28/2006  DATE OF DISCHARGE:                                HISTORY & PHYSICAL   [name] is a the 55s 35-year-old, gravida 10 para, 3-0-6-2, who presents at  40-2/[redacted] weeks gestation with complaints of regular uterine contractions since  2:00 a.m. on the day of admission.  The patient reports contractions with  increased frequency and intensity since onset.  The patient denies rupture  of membranes or bleeding.  The patient reports some decreased fetal movement  earlier today.  The patient also does have complaint of a mild headache;  however, she denies vision changes, right upper quadrant pain.  The patient  reports that she has had headaches the past several days.  The patient's GBS  status is negative.  The patient's pregnancy is remarkable for remarkable  for:  1.  Increased body mass index.  2.  History of postpartum depression.  3.  History of macrosomia.  4.  PENICILLIN allergy.  5.  Death of a child secondary to brain cancer.  6.  SAB times4, TAB x2.   PRENATAL LABORATORY DATA:  Initial hemoglobin 12.6, hematocrit 38.2,  platelets 303,000.  Blood type O+, antibody screen negative.  Sickle cell  trait negative.  RPR nonreactive, rubella titer immune, hepatitis B surface  antigen negative, HIV nonreactive. Gonorrhea and Chlamydia are negative.  Cystic fibrosis screen negative. The 1-hour Glucola was 92.  RPR nonreactive  at 29 weeks. Group B strep negative. Gonorrhea, Chlamydia third trimester,  negative.   HISTORY OF PRESENT PREGNANCY:  The patient entered care at [redacted] weeks  gestation. The patient underwent a nuchal translucency ultrasound; however,  the patient was to far along, and ultrasound at 14 weeks and  4 days at that  time. The patient then presented to 18 weeks and at that time declined  ultrasound for anatomy and stated that she had decided to terminate the  pregnancy. The patient then represented at 27 weeks and states that she  continued the pregnancy. Ultrasound completed at 27 weeks revealed estimated  fetal weight at 60th percentile, normal amniotic fluid, cervix 4.08 cm and  vertex presentation and complete normal anatomy, and the patient had  declined her ultrasound 18 weeks for anatomy.  The patient underwent Glucola  at at 27 weeks. Ultrasound repeated at 31 weeks secondary to size greater  than dates revealed estimated fetal weight  as 81st to 82nd percentile with  mild polyhydramnios. The patient was seen for a fasting blood sugar  secondary to polyhydramnios with the results of 76. The patient with  complaints of cramping at 34 weeks as well as nausea and vomiting.  The  patient with negative fetal fibronectin at that time.  The patient was seen  at [facility] for IV fluids and monitoring at that time and was discharged home.  And gonorrhea and chlamydia  cultures for group B strep obtained at 34 weeks  negative. By 35 weeks, ultrasound repeated to follow up polyhydramnios as  well as growth revealed estimated fetal weight in the 75th percentile,  amniotic fluid volume normal at 18.9 cm, and cervix 3.6 cm. The patient with  complaints of GERD at that time and was given a prescription for Nexium and  was recommended to use Benadryl for allergies at that time. The patient with  complaint of decreased fetal movement at 37 weeks at which time nonstress  test was reactive. The patient was scheduled for induction of labor at 33-  6/7 weeks secondary to history of large-for-gestational-age infant, and that  was at 39-6/7 weeks.  The remainder of pregnancy has been unremarkable.   The patient's pregnancy has been followed by the C.N.M. service at [facility].   OB HISTORY:   Pregnancy #1 in 0960 SAB, no complications.  In 1997, an SAB at  [redacted] weeks gestation was SAB of twins, no complications.  December 1997,  spontaneous vaginal delivery of a female infant at [redacted] weeks gestation, 7  pounds 11 ounces, 45-WUJW labor, no complications.  In 1998 elective  interruption of pregnancy at [redacted] weeks gestation.  No complications. In 1999,  spontaneous vaginal delivery of viable female infant at [redacted] weeks gestation,  8 pounds, 1 ounce, 4-hour labor.  No complications. In 2004, spontaneous AB  at 5 weeks. No complications.  In 2005, spontaneous  AB at 8 weeks. No  complications. January 2006, spontaneous vaginal delivery of a female infant  at 41-3/[redacted] weeks gestation, 9 pound 0 ounces, 5-hour labor, without  complications. Next pregnancy, elective interruption of pregnancy in 1006  and then current pregnancy.   GYN HISTORY:  The patient with no history of abnormal Pap.  Patient with no  history of STDs.  The patient has used Norplant in the past.  The patient  with a history of postpartum depression in the past but no medications.   MEDICAL HISTORY:  Significant for seasonal allergies, GERD, chronic  constipation.   FAMILY HISTORY:  Grandparents with chronic hypertension.  Maternal  grandmother with diabetes, and maternal aunts and uncles with chronic  hypertension and diabetes.  Mother with migraines, sarcoidosis. Daughter  with brain cancer, deceased at age 76.  Depression at age 45, used Prozac in  the past, and history of postpartum depression.   GENETIC HISTORY:  Father of the baby born with no heart tumor which was  removed and is otherwise negative   ALLERGIES:  1.  OXYCODONE causes anaphylaxis.  2.  INSULIN causes a rash.   MEDICATIONS:  Current medications include prenatal vitamins and Nexium.   SURGICAL HISTORY:  Wisdom teeth and a thrombosed hemorrhoids in 2006.   SOCIAL HISTORY:  The patient is single.  The father of the baby, Alcario Drought, is involved and  supportive.  The patient denies use of alcohol, tobacco or  street drugs.  Patient with a previous history of tobacco use in the past.  The patient's domestic violence screen is negative.  The patient has 13  years of education and is unemployed.  Father of baby works in Architect.   PHYSICAL EXAMINATION:  VITAL SIGNS: The patient is afebrile.  Vital signs  were stable.  HEENT: Within normal limits.  LUNGS: Clear.  HEART: Regular rate and rhythm.  ABDOMEN: Soft, gravid and nontender with fundal height at 39 cm. Fetal heart  rate 120-130s with variability and negative CS, spontaneous CST;  however,  not technically reactive with 5 x 5 and 10 x 10 accelerations present. No  decelerations were noted.  Uterine contractions are noted every 2-4 minutes.  PELVIC EXAM: Finds cervix to be 3 cm dilated, 80% effaced, -2 station.  Bulging bag of water.  EXTREMITIES: Negative edema bilaterally.  Negative Homan's bilaterally.   ASSESSMENT:  1.  Intrauterine pregnancy at 40-2/[redacted] weeks gestation.  2.  Early labor.   PLAN:  1.  The patient will be admitted to birthing suites per consult with Dr.      Katharine Look Rivard.  2.  Routine C.N.M. orders.  3.  The patient does not plan epidural at the present time.      Quentin Cornwall, CNM      Dede Query Rivard, M.D.  Electronically Signed    NOS/MEDQ  D:  02/28/2006  T:  02/28/2006  Job:  [id number]

## 2011-03-04 ENCOUNTER — Emergency Department (HOSPITAL_COMMUNITY): Payer: Medicaid Other

## 2011-03-04 ENCOUNTER — Emergency Department (HOSPITAL_COMMUNITY)
Admission: EM | Admit: 2011-03-04 | Discharge: 2011-03-05 | Disposition: A | Discharge status: Home / Self Care | Payer: Medicaid Other | Attending: Emergency Medicine | Admitting: Emergency Medicine

## 2011-03-04 DIAGNOSIS — R05 Cough: Secondary | ICD-10-CM | POA: Insufficient documentation

## 2011-03-04 DIAGNOSIS — J343 Hypertrophy of nasal turbinates: Secondary | ICD-10-CM | POA: Insufficient documentation

## 2011-03-04 DIAGNOSIS — R059 Cough, unspecified: Secondary | ICD-10-CM | POA: Insufficient documentation

## 2011-03-04 DIAGNOSIS — R070 Pain in throat: Secondary | ICD-10-CM | POA: Insufficient documentation

## 2011-03-04 DIAGNOSIS — R6889 Other general symptoms and signs: Secondary | ICD-10-CM | POA: Insufficient documentation

## 2011-03-04 DIAGNOSIS — J069 Acute upper respiratory infection, unspecified: Secondary | ICD-10-CM | POA: Insufficient documentation

## 2011-03-04 DIAGNOSIS — H9209 Otalgia, unspecified ear: Secondary | ICD-10-CM | POA: Insufficient documentation

## 2011-03-04 DIAGNOSIS — J3489 Other specified disorders of nose and nasal sinuses: Secondary | ICD-10-CM | POA: Insufficient documentation

## 2011-03-05 LAB — URINALYSIS, ROUTINE W REFLEX MICROSCOPIC
Nitrite: NEGATIVE
Specific Gravity, Urine: 1.018 (ref 1.005–1.030)
pH: 6 (ref 5.0–8.0)

## 2011-03-05 LAB — RAPID STREP SCREEN (MED CTR MEBANE ONLY): Streptococcus, Group A Screen (Direct): NEGATIVE

## 2011-06-22 ENCOUNTER — Emergency Department (HOSPITAL_COMMUNITY)
Admission: EM | Admit: 2011-06-22 | Discharge: 2011-06-22 | Disposition: A | Discharge status: Home / Self Care | Payer: Medicaid Other | Attending: Emergency Medicine | Admitting: Emergency Medicine

## 2011-06-22 DIAGNOSIS — M7989 Other specified soft tissue disorders: Secondary | ICD-10-CM | POA: Insufficient documentation

## 2011-06-22 DIAGNOSIS — S60229A Contusion of unspecified hand, initial encounter: Secondary | ICD-10-CM | POA: Insufficient documentation

## 2011-06-22 DIAGNOSIS — X58XXXA Exposure to other specified factors, initial encounter: Secondary | ICD-10-CM | POA: Insufficient documentation

## 2011-06-22 DIAGNOSIS — M79609 Pain in unspecified limb: Secondary | ICD-10-CM | POA: Insufficient documentation

## 2011-06-24 LAB — CBC
HCT: 37.3
Platelets: 298
RDW: 12.7

## 2011-06-24 LAB — URINE MICROSCOPIC-ADD ON

## 2011-06-24 LAB — URINALYSIS, ROUTINE W REFLEX MICROSCOPIC
Nitrite: NEGATIVE
Specific Gravity, Urine: 1.01
Urobilinogen, UA: 0.2

## 2011-07-10 LAB — RAPID STREP SCREEN (MED CTR MEBANE ONLY): Streptococcus, Group A Screen (Direct): POSITIVE — AB

## 2011-07-10 LAB — URINALYSIS, ROUTINE W REFLEX MICROSCOPIC
Glucose, UA: NEGATIVE
Leukocytes, UA: NEGATIVE
Protein, ur: 30 — AB

## 2011-07-10 LAB — URINE MICROSCOPIC-ADD ON

## 2011-09-02 ENCOUNTER — Emergency Department (HOSPITAL_COMMUNITY)
Admission: EM | Admit: 2011-09-02 | Discharge: 2011-09-02 | Disposition: A | Discharge status: Home / Self Care | Payer: Medicaid Other | Attending: Emergency Medicine | Admitting: Emergency Medicine

## 2011-09-02 DIAGNOSIS — R059 Cough, unspecified: Secondary | ICD-10-CM | POA: Insufficient documentation

## 2011-09-02 DIAGNOSIS — R079 Chest pain, unspecified: Secondary | ICD-10-CM | POA: Insufficient documentation

## 2011-09-02 DIAGNOSIS — IMO0001 Reserved for inherently not codable concepts without codable children: Secondary | ICD-10-CM | POA: Insufficient documentation

## 2011-09-02 DIAGNOSIS — R070 Pain in throat: Secondary | ICD-10-CM | POA: Insufficient documentation

## 2011-09-02 DIAGNOSIS — R05 Cough: Secondary | ICD-10-CM | POA: Insufficient documentation

## 2011-09-02 DIAGNOSIS — J3489 Other specified disorders of nose and nasal sinuses: Secondary | ICD-10-CM | POA: Insufficient documentation

## 2011-09-02 DIAGNOSIS — H9209 Otalgia, unspecified ear: Secondary | ICD-10-CM | POA: Insufficient documentation

## 2011-09-02 DIAGNOSIS — J069 Acute upper respiratory infection, unspecified: Secondary | ICD-10-CM

## 2011-09-02 DIAGNOSIS — R51 Headache: Secondary | ICD-10-CM | POA: Insufficient documentation

## 2011-09-02 MED ORDER — NAPROXEN 500 MG PO TABS: 500.0000 mg | ORAL_TABLET | Freq: Two times a day (BID) | ORAL | Status: DC

## 2011-09-02 MED ORDER — IBUPROFEN 800 MG PO TABS
800.0000 mg | ORAL_TABLET | Freq: Once | ORAL | Status: AC
  Administered 2011-09-02: 800 mg via ORAL
  Filled 2011-09-02: qty 1

## 2011-09-02 MED ORDER — AZITHROMYCIN 250 MG PO TABS: 250.0000 mg | ORAL_TABLET | Freq: Every day | ORAL | Status: AC

## 2011-09-02 NOTE — ED Notes (Signed)
Pt presents to ED with cough sore throat and runny nose.

## 2011-09-02 NOTE — ED Notes (Signed)
Pt reports sick contacts at home. States since yesterday has had fever as high as 102. Reports headache, bodyaches, sore throat and congestion.

## 2011-09-02 NOTE — ED Provider Notes (Signed)
History     CSN: [id number] Arrival date & time: 09/02/2011  9:00 AM   First MD Initiated Contact with Patient 09/02/11 1117      Chief Complaint  Patient presents with  . Cough    (Consider location/radiation/quality/duration/timing/severity/associated sxs/prior treatment) Patient is a 35 y.o. female presenting with cough. The history is provided by the patient.  Cough The current episode started 2 days ago. The problem occurs every few minutes. The problem has been gradually worsening. The cough is productive of sputum. Maximum temperature: Questionable fever. Associated symptoms include chest pain, ear pain, headaches, sore throat and myalgias. Pertinent negatives include no chills, no wheezing and no eye redness. She has tried nothing for the symptoms. Her past medical history is significant for asthma.    History reviewed. No pertinent past medical history.  History reviewed. No pertinent past surgical history.  History reviewed. No pertinent family history.  History  Substance Use Topics  . Smoking status: Current Everyday Smoker  . Smokeless tobacco: Not on file  . Alcohol Use: No    OB History    Grav Para Term Preterm Abortions TAB SAB Ect Mult Living                  Review of Systems  Constitutional: Negative for chills.  HENT: Positive for ear pain, congestion and sore throat. Negative for neck stiffness.   Eyes: Negative for redness.  Respiratory: Positive for cough. Negative for wheezing.   Cardiovascular: Positive for chest pain.  Gastrointestinal: Negative for nausea, vomiting and abdominal pain.  Genitourinary: Negative for dysuria.  Musculoskeletal: Positive for myalgias. Negative for back pain.  Skin: Negative for rash.  Neurological: Positive for headaches.  Hematological: Negative for adenopathy.    Allergies  Oxycodone; Ciprofloxacin; Doxycycline; and Penicillins  Home Medications   Current Outpatient Rx  Name Route Sig Dispense Refill    . ALBUTEROL SULFATE HFA 108 (90 BASE) MCG/ACT IN AERS Inhalation Inhale 2 puffs into the lungs every 6 (six) hours as needed. For shortness of breath     . DEXTROMETHORPHAN POLISTIREX ER 30 MG/5ML PO LQCR Oral Take 60 mg by mouth every 12 (twelve) hours as needed. For cough     . AZITHROMYCIN 250 MG PO TABS Oral Take 1 tablet (250 mg total) by mouth daily. Take first 2 tablets together, then 1 every day until finished. 6 tablet 0  . NAPROXEN 500 MG PO TABS Oral Take 1 tablet (500 mg total) by mouth 2 (two) times daily. 30 tablet 0    BP 116/80  Pulse 98  Temp(Src) 99.4 F (37.4 C) (Oral)  SpO2 100%  Physical Exam  Nursing note and vitals reviewed. Constitutional: She is oriented to person, place, and time. She appears well-developed and well-nourished.  HENT:  Head: Normocephalic and atraumatic.  Right Ear: External ear normal.  Left Ear: External ear normal.  Mouth/Throat: Oropharynx is clear and moist.  Eyes: Conjunctivae and EOM are normal. Pupils are equal, round, and reactive to light.  Neck: Normal range of motion. Neck supple.  Cardiovascular: Normal rate, regular rhythm and normal heart sounds.   No murmur heard. Pulmonary/Chest: Effort normal and breath sounds normal. She has no wheezes.  Abdominal: Soft. Bowel sounds are normal. There is no tenderness.  Musculoskeletal: Normal range of motion. She exhibits no edema.  Neurological: She is alert and oriented to person, place, and time. No cranial nerve deficit. She exhibits normal muscle tone. Coordination normal.  Skin: Skin is warm. No  rash noted.    ED Course  Procedures (including critical care time)  Labs Reviewed - No data to display No results found.   1. Upper respiratory infection       MDM   Family members with the same illness. Suspect upper respiratory infection and not influenza. Will treat with Zithromax in case of strep throat or early pneumonia. Despite the left ear pain no evidence of left  otitis media.         Mervin Kung, MD 09/02/11 1153

## 2011-09-16 ENCOUNTER — Other Ambulatory Visit: Payer: Self-pay | Admitting: Obstetrics and Gynecology

## 2011-09-17 ENCOUNTER — Encounter (HOSPITAL_COMMUNITY): Payer: Self-pay | Admitting: *Deleted

## 2011-10-01 ENCOUNTER — Encounter (HOSPITAL_COMMUNITY): Payer: Self-pay

## 2011-10-15 MED ORDER — GENTAMICIN SULFATE 40 MG/ML IJ SOLN
INTRAVENOUS | Status: AC
  Administered 2011-10-16: 100 mL via INTRAVENOUS
  Filled 2011-10-15: qty 2.5

## 2011-10-15 NOTE — H&P (Signed)
NAME:  [name], [name]                  ACCOUNT NO.:  MEDICAL RECORD NO.:  [id number]  LOCATION:                                 FACILITY:  PHYSICIAN:  Katharine Look A. Analise Glotfelty, M.D. DATE OF BIRTH:  [birth year]  DATE OF ADMISSION:  10/14/2011 DATE OF DISCHARGE:                             HISTORY & PHYSICAL   REASON FOR ADMISSION:  Menorrhagia.  HISTORY OF PRESENT ILLNESS:  This is a 36 year old single African American female, who comes to [place] as a new patient on August 13, 2011 for an annual exam.  During that visit, she reports a menstrual cycle that is monthly, every 24-26 days, lasting 5-8 days with 4 very heavy days where the patient requires 1 pad and 1 tampon every 1-1/2 to 2 hours.  She also reports passing clots 1-2 cm in diameter, and reports severe dysmenorrhea requiring ibuprofen 1600 mg 4 times a day.  She also reports a history of Mirena insertion in July 2007, which was then further removed in an emergency room visit after the patient was told she had a uterine perforation.  A pelvic ultrasound performed on September 09, 2011 reveals a uterus measuring 5.9 x 10.4 x 7.66 cm, with a normal cavity length and width. Endometrial lining is 0.96 cm, and there is an anterior intramural fibroid measuring 2.4 cm.  Ovaries are otherwise normal.  The patient desires to proceed with NovaSure endometrial ablation to address her problem of menorrhagia.  PAST MEDICAL HISTORY:  Gravida 9, para 4 with 4 spontaneous vaginal deliveries,  laparoscopy and IUD removal, and hemorrhoidal surgery.  SOCIAL HISTORY:  Single.  Smoker.  Homemaker.  ALLERGIES: 1. OXYCODONE. 2. DOXYCYCLINE. 3. PENICILLIN. 4. CIPRO.  FAMILY HISTORY:  Noncontributory.  REVIEW OF SYSTEMS:  CARDIOVASCULAR:  Negative.  CONSTITUTIONAL: Negative.  BREASTS:  Negative.  URINARY:  Negative.  GASTROINTESTINAL: Chronic constipation, improved by occasional use of milk of magnesia.  PHYSICAL EXAMINATION:  VITAL SIGNS:   Current weight is 270 pounds for a height of 5 feet 6 inches, a BMI of 44, blood pressure is 112/74. HEAD, EYES, EARS, NOSE, AND THROAT:  Negative. NECK:  Thyroid not enlarged. CARDIOVASCULAR:  Regular rate and rhythm. CHEST:  Clear. SKIN:  Normal. BREASTS:  Normal. BACK:  No CVA tenderness. ABDOMEN:  No tenderness, masses, or hepatosplenomegaly. MUSCULOSKELETAL:  Negative. NEUROLOGIC:  Within normal limits. PELVIC:  Normal external genitalia, normal vagina, normal cervix, normal uterus, and normal adnexa.  ASSESSMENT:  Menorrhagia in a patient who desires to proceed with a NovaSure endometrial ablation.  PLAN:  We will proceed with hysteroscopy, D and C, NovaSure endometrial ablation.  The procedure has been thoroughly reviewed with the patient, as well as the possible risks and complications.  Mainly, she is informed of a 6-10% failure rate, possible infection, possible bleeding, possible uterine perforation, need for very reliable contraception.  The patient voices understanding and is agreeable to proceed.     [name], M.D.     SAR/MEDQ  D:  10/14/2011  T:  10/15/2011  Job:  [id number]

## 2011-10-16 ENCOUNTER — Encounter (HOSPITAL_COMMUNITY): Payer: Self-pay | Admitting: Anesthesiology

## 2011-10-16 ENCOUNTER — Encounter (HOSPITAL_COMMUNITY)
Admission: RE | Disposition: A | Discharge status: Home / Self Care | Payer: Self-pay | Source: Ambulatory Visit | Attending: Obstetrics and Gynecology

## 2011-10-16 ENCOUNTER — Encounter (HOSPITAL_COMMUNITY): Payer: Self-pay | Admitting: *Deleted

## 2011-10-16 ENCOUNTER — Ambulatory Visit (HOSPITAL_COMMUNITY): Payer: Medicaid Other | Admitting: Anesthesiology

## 2011-10-16 ENCOUNTER — Other Ambulatory Visit: Payer: Self-pay | Admitting: Obstetrics and Gynecology

## 2011-10-16 ENCOUNTER — Ambulatory Visit (HOSPITAL_COMMUNITY)
Admission: RE | Admit: 2011-10-16 | Discharge: 2011-10-16 | Disposition: A | Discharge status: Home / Self Care | Payer: Medicaid Other | Source: Ambulatory Visit | Attending: Obstetrics and Gynecology | Admitting: Obstetrics and Gynecology

## 2011-10-16 DIAGNOSIS — Z302 Encounter for sterilization: Secondary | ICD-10-CM | POA: Insufficient documentation

## 2011-10-16 DIAGNOSIS — N920 Excessive and frequent menstruation with regular cycle: Secondary | ICD-10-CM | POA: Insufficient documentation

## 2011-10-16 DIAGNOSIS — N946 Dysmenorrhea, unspecified: Secondary | ICD-10-CM | POA: Insufficient documentation

## 2011-10-16 HISTORY — DX: Bronchitis, not specified as acute or chronic: J40

## 2011-10-16 HISTORY — PX: LAPAROSCOPIC TUBAL LIGATION: SHX1937

## 2011-10-16 LAB — CBC
Hemoglobin: 10.5 g/dL — ABNORMAL LOW (ref 12.0–15.0)
MCHC: 30.8 g/dL (ref 30.0–36.0)
Platelets: 418 10*3/uL — ABNORMAL HIGH (ref 150–400)
RBC: 4.08 MIL/uL (ref 3.87–5.11)

## 2011-10-16 LAB — PREGNANCY, URINE: Preg Test, Ur: NEGATIVE

## 2011-10-16 SURGERY — DILATATION & CURETTAGE/HYSTEROSCOPY WITH NOVASURE ABLATION
Anesthesia: General | Site: Vagina | Wound class: Clean Contaminated

## 2011-10-16 MED ORDER — CHLOROPROCAINE HCL 1 % IJ SOLN
INTRAMUSCULAR | Status: AC
  Filled 2011-10-16: qty 30

## 2011-10-16 MED ORDER — CHLOROPROCAINE HCL 1 % IJ SOLN
INTRAMUSCULAR | Status: DC | PRN
  Administered 2011-10-16: 16 mL

## 2011-10-16 MED ORDER — LIDOCAINE HCL (CARDIAC) 20 MG/ML IV SOLN
INTRAVENOUS | Status: AC
  Filled 2011-10-16: qty 5

## 2011-10-16 MED ORDER — DEXAMETHASONE SODIUM PHOSPHATE 10 MG/ML IJ SOLN
INTRAMUSCULAR | Status: AC
  Filled 2011-10-16: qty 1

## 2011-10-16 MED ORDER — MIDAZOLAM HCL 2 MG/2ML IJ SOLN
INTRAMUSCULAR | Status: AC
  Filled 2011-10-16: qty 2

## 2011-10-16 MED ORDER — FENTANYL CITRATE 0.05 MG/ML IJ SOLN
INTRAMUSCULAR | Status: AC
  Filled 2011-10-16: qty 2

## 2011-10-16 MED ORDER — KETOROLAC TROMETHAMINE 60 MG/2ML IM SOLN
INTRAMUSCULAR | Status: AC
  Filled 2011-10-16: qty 2

## 2011-10-16 MED ORDER — GLYCOPYRROLATE 0.2 MG/ML IJ SOLN
INTRAMUSCULAR | Status: AC
  Filled 2011-10-16: qty 1

## 2011-10-16 MED ORDER — DEXAMETHASONE SODIUM PHOSPHATE 4 MG/ML IJ SOLN
INTRAMUSCULAR | Status: DC | PRN
  Administered 2011-10-16: 10 mg via INTRAVENOUS

## 2011-10-16 MED ORDER — FENTANYL CITRATE 0.05 MG/ML IJ SOLN
25.0000 ug | INTRAMUSCULAR | Status: DC | PRN
  Administered 2011-10-16 (×2): 50 ug via INTRAVENOUS

## 2011-10-16 MED ORDER — PROPOFOL 10 MG/ML IV EMUL
INTRAVENOUS | Status: DC | PRN
  Administered 2011-10-16: 200 mg via INTRAVENOUS

## 2011-10-16 MED ORDER — FENTANYL CITRATE 0.05 MG/ML IJ SOLN
INTRAMUSCULAR | Status: AC
  Filled 2011-10-16: qty 5

## 2011-10-16 MED ORDER — ONDANSETRON HCL 4 MG/2ML IJ SOLN
INTRAMUSCULAR | Status: DC | PRN
  Administered 2011-10-16: 4 mg via INTRAVENOUS

## 2011-10-16 MED ORDER — NEOSTIGMINE METHYLSULFATE 1 MG/ML IJ SOLN
INTRAMUSCULAR | Status: AC
  Filled 2011-10-16: qty 10

## 2011-10-16 MED ORDER — PROPOFOL 10 MG/ML IV EMUL
INTRAVENOUS | Status: AC
  Filled 2011-10-16: qty 20

## 2011-10-16 MED ORDER — ROCURONIUM BROMIDE 50 MG/5ML IV SOLN
INTRAVENOUS | Status: AC
  Filled 2011-10-16: qty 1

## 2011-10-16 MED ORDER — ROCURONIUM BROMIDE 100 MG/10ML IV SOLN
INTRAVENOUS | Status: DC | PRN
  Administered 2011-10-16: 40 mg via INTRAVENOUS
  Administered 2011-10-16: 10 mg via INTRAVENOUS

## 2011-10-16 MED ORDER — LIDOCAINE HCL (CARDIAC) 20 MG/ML IV SOLN
INTRAVENOUS | Status: DC | PRN
  Administered 2011-10-16: 60 mg via INTRAVENOUS

## 2011-10-16 MED ORDER — LACTATED RINGERS IR SOLN
Status: DC | PRN
  Administered 2011-10-16: 3000 mL

## 2011-10-16 MED ORDER — LACTATED RINGERS IV SOLN
INTRAVENOUS | Status: DC
  Administered 2011-10-16 (×2): via INTRAVENOUS

## 2011-10-16 MED ORDER — BUPIVACAINE HCL (PF) 0.25 % IJ SOLN
INTRAMUSCULAR | Status: AC
  Filled 2011-10-16: qty 30

## 2011-10-16 MED ORDER — HYDROCODONE-ACETAMINOPHEN 5-500 MG PO TABS: 1.0000 | ORAL_TABLET | Freq: Four times a day (QID) | ORAL | Status: AC | PRN

## 2011-10-16 MED ORDER — NEOSTIGMINE METHYLSULFATE 1 MG/ML IJ SOLN
INTRAMUSCULAR | Status: DC | PRN
  Administered 2011-10-16: 3.5 mg via INTRAVENOUS

## 2011-10-16 MED ORDER — ONDANSETRON HCL 4 MG/2ML IJ SOLN
INTRAMUSCULAR | Status: AC
  Filled 2011-10-16: qty 2

## 2011-10-16 MED ORDER — BUPIVACAINE HCL 0.25 % IJ SOLN
INTRAMUSCULAR | Status: DC | PRN
  Administered 2011-10-16: 10 mL

## 2011-10-16 MED ORDER — GLYCOPYRROLATE 0.2 MG/ML IJ SOLN
INTRAMUSCULAR | Status: DC | PRN
  Administered 2011-10-16: .7 mg via INTRAVENOUS

## 2011-10-16 MED ORDER — FENTANYL CITRATE 0.05 MG/ML IJ SOLN
INTRAMUSCULAR | Status: DC | PRN
  Administered 2011-10-16: 25 ug via INTRAVENOUS
  Administered 2011-10-16: 100 ug via INTRAVENOUS
  Administered 2011-10-16: 25 ug via INTRAVENOUS
  Administered 2011-10-16: 100 ug via INTRAVENOUS
  Administered 2011-10-16: 50 ug via INTRAVENOUS

## 2011-10-16 SURGICAL SUPPLY — 32 items
ABLATOR ENDOMETRIAL BIPOLAR (ABLATOR) ×3 IMPLANT
ADH SKN CLS APL DERMABOND .7 (GAUZE/BANDAGES/DRESSINGS) ×2
BLADE SURG 11 STRL SS (BLADE) ×1 IMPLANT
BLADE SURG 15 STRL LF C SS BP (BLADE) IMPLANT
BLADE SURG 15 STRL SS (BLADE) ×3
CATH ROBINSON RED A/P 16FR (CATHETERS) ×3 IMPLANT
CLOTH BEACON ORANGE TIMEOUT ST (SAFETY) ×3 IMPLANT
CONTAINER PREFILL 10% NBF 60ML (FORM) ×1 IMPLANT
COUNTER NEEDLE 1200 MAGNETIC (NEEDLE) ×1 IMPLANT
COVER LIGHT HANDLE  1/PK (MISCELLANEOUS) ×2
COVER LIGHT HANDLE 1/PK (MISCELLANEOUS) ×2 IMPLANT
COVER TABLE BACK 60X90 (DRAPES) ×1 IMPLANT
DERMABOND ADVANCED (GAUZE/BANDAGES/DRESSINGS) ×1
DERMABOND ADVANCED .7 DNX12 (GAUZE/BANDAGES/DRESSINGS) IMPLANT
GLOVE BIOGEL PI IND STRL 7.0 (GLOVE) ×4 IMPLANT
GLOVE BIOGEL PI INDICATOR 7.0 (GLOVE) ×3
GOWN PREVENTION PLUS LG XLONG (DISPOSABLE) ×6 IMPLANT
GOWN STRL REIN XL XLG (GOWN DISPOSABLE) ×3 IMPLANT
NDL HYPO 25X1 1.5 SAFETY (NEEDLE) IMPLANT
NEEDLE HYPO 25X1 1.5 SAFETY (NEEDLE) ×3 IMPLANT
PACK HYSTEROSCOPY LF (CUSTOM PROCEDURE TRAY) ×3 IMPLANT
PAD PREP 24X48 CUFFED NSTRL (MISCELLANEOUS) ×3 IMPLANT
SOLUTION ANTI FOG 6CC (MISCELLANEOUS) ×1 IMPLANT
SUT MNCRL AB 3-0 PS2 27 (SUTURE) ×1 IMPLANT
SUT VIC AB 2-0 SH 27 (SUTURE) ×3
SUT VIC AB 2-0 SH 27XBRD (SUTURE) ×1 IMPLANT
SUT VICRYL 0 UR6 27IN ABS (SUTURE) ×2 IMPLANT
SYR CONTROL 10ML LL (SYRINGE) ×2 IMPLANT
TOWEL OR 17X24 6PK STRL BLUE (TOWEL DISPOSABLE) ×3 IMPLANT
TROCAR BALLN 12MMX100 BLUNT (TROCAR) ×1 IMPLANT
WARMER LAPAROSCOPE (MISCELLANEOUS) ×2 IMPLANT
WATER STERILE IRR 1000ML POUR (IV SOLUTION) ×3 IMPLANT

## 2011-10-16 NOTE — Transfer of Care (Signed)
Immediate Anesthesia Transfer of Care Note  Patient: [name]  Procedure(s) Performed:  DILATATION & CURETTAGE/HYSTEROSCOPY WITH NOVASURE ABLATION; LAPAROSCOPIC TUBAL LIGATION  Patient Location: PACU  Anesthesia Type: General  Level of Consciousness: awake, oriented and sedated  Airway & Oxygen Therapy: Patient Spontanous Breathing and Patient connected to nasal cannula oxygen  Post-op Assessment: Report given to PACU RN and Post -op Vital signs reviewed and stable  Post vital signs: Reviewed and stable Filed Vitals:   10/16/11 1350  BP: 135/68  Pulse: 64  Temp: 36.8 C  Resp: 14    Complications: No apparent anesthesia complications

## 2011-10-16 NOTE — H&P (View-Only) (Signed)
NAME:  [name], [name]                  ACCOUNT NO.:  MEDICAL RECORD NO.:  [id number]  LOCATION:                                 FACILITY:  PHYSICIAN:  Katharine Look A. Franca Stakes, M.D. DATE OF BIRTH:  November 15, 1975  DATE OF ADMISSION:  10/14/2011 DATE OF DISCHARGE:                             HISTORY & PHYSICAL   REASON FOR ADMISSION:  Menorrhagia.  HISTORY OF PRESENT ILLNESS:  This is a 36 year old single African American female, who comes to [place] as a new patient on August 13, 2011 for an annual exam.  During that visit, she reports a menstrual cycle that is monthly, every 24-26 days, lasting 5-8 days with 4 very heavy days where the patient requires 1 pad and 1 tampon every 1-1/2 to 2 hours.  She also reports passing clots 1-2 cm in diameter, and reports severe dysmenorrhea requiring ibuprofen 1600 mg 4 times a day.  She also reports a history of Mirena insertion in July 2007, which was then further removed in an emergency room visit after the patient was told she had a uterine perforation.  A pelvic ultrasound performed on September 09, 2011 reveals a uterus measuring 5.9 x 10.4 x 7.66 cm, with a normal cavity length and width. Endometrial lining is 0.96 cm, and there is an anterior intramural fibroid measuring 2.4 cm.  Ovaries are otherwise normal.  The patient desires to proceed with NovaSure endometrial ablation to address her problem of menorrhagia.  PAST MEDICAL HISTORY:  Gravida 9, para 4 with 4 spontaneous vaginal deliveries,  laparoscopy and IUD removal, and hemorrhoidal surgery.  SOCIAL HISTORY:  Single.  Smoker.  Homemaker.  ALLERGIES: 1. OXYCODONE. 2. DOXYCYCLINE. 3. PENICILLIN. 4. CIPRO.  FAMILY HISTORY:  Noncontributory.  REVIEW OF SYSTEMS:  CARDIOVASCULAR:  Negative.  CONSTITUTIONAL: Negative.  BREASTS:  Negative.  URINARY:  Negative.  GASTROINTESTINAL: Chronic constipation, improved by occasional use of milk of magnesia.  PHYSICAL EXAMINATION:  VITAL SIGNS:   Current weight is 270 pounds for a height of 5 feet 6 inches, a BMI of 44, blood pressure is 112/74. HEAD, EYES, EARS, NOSE, AND THROAT:  Negative. NECK:  Thyroid not enlarged. CARDIOVASCULAR:  Regular rate and rhythm. CHEST:  Clear. SKIN:  Normal. BREASTS:  Normal. BACK:  No CVA tenderness. ABDOMEN:  No tenderness, masses, or hepatosplenomegaly. MUSCULOSKELETAL:  Negative. NEUROLOGIC:  Within normal limits. PELVIC:  Normal external genitalia, normal vagina, normal cervix, normal uterus, and normal adnexa.  ASSESSMENT:  Menorrhagia in a patient who desires to proceed with a NovaSure endometrial ablation.  PLAN:  We will proceed with hysteroscopy, D and C, NovaSure endometrial ablation.  The procedure has been thoroughly reviewed with the patient, as well as the possible risks and complications.  Mainly, she is informed of a 6-10% failure rate, possible infection, possible bleeding, possible uterine perforation, need for very reliable contraception.  The patient voices understanding and is agreeable to proceed.     Dede Query Tashala Cumbo, M.D.     SAR/MEDQ  D:  10/14/2011  T:  10/15/2011  Job:  [id number]

## 2011-10-16 NOTE — Interval H&P Note (Signed)
History and Physical Interval Note:  10/16/2011 10:41 AM  [name]  has presented today for surgery, with the diagnosis of Menorrhagia and desire for sterilization. The various methods of treatment have been discussed with the patient and family. After consideration of risks, benefits and other options for treatment, the patient has consented to  Procedure(s): [place] as a surgical intervention .  The patients' history has been reviewed, patient examined, no change in status, stable for surgery.  I have reviewed the patients' chart and labs.  Questions were answered to the patient's satisfaction.     Nyasha Rahilly A

## 2011-10-16 NOTE — Op Note (Signed)
Preoperative diagnosis: Menorrhagia and desire for sterilization  Postoperative diagnosis: Same  Anesthesia: General  Anesthesiologist: Dr. Lyndle Herrlich  Surgeon: Dr. Katharine Look Markie Heffernan  Procedure:  After being informed of the planned procedure with possible complications including bleeding infection injury to other organs as well as irreversibility of tubal ligation and failure rate of 1 in 500, informed consent was obtained and patient was taken to or #3. She was given general anesthesia with endotracheal intubation without any complication. She was placed in the lithotomy position with knee-high sequential compressive devices and left arm padded and tucked on her side. She was prepped and draped in the style fashion and her bladder was emptied with an in and out red rubber catheter.  Pelvic exam reveals a anteverted uterus slightly enlarged mobile and 2 normal adnexa. A weighted speculum was inserted in the vagina. Anterior lip of the cervix was grasped with a tenaculum forcep. Uterus was sounded at 11 cm and cervix was measured at 3 cm. The cervix was easily dilated using Hegar dilator until #29 which allowed easy entry of a diagnostic hysteroscope. With perfusion of lactate lactate ringer at a maximum pressure of 80 mm of mercury, we were able to visualize the entire uterine cavity with a thickened endometrium in 2 normal tubal ostia. The hysteroscope was removed. We proceeded with sharp curettage of the endometrial cavity removing a normal amount of normal-appearing endometrial tissue.  The NovaSure was then inserted in the endometrial cavity with a setting of 6.5 cm in length. It was deployed with a with measured at 4.6 cm. Integrity of the cavity was tested and adequate. The NovaSure was triggered until completion of the procedure and 49 seconds. The NovaSure was then retrieved from the endometrial cavity and a second look hysteroscopy revealed a completely blanched endometrial cavity.  A. acorn  intrauterine manipulator was then inserted to proceed with the tubal ligation.  We infiltrate the umbilical area using 10 cc of Marcaine 0.25 and perform a semi-elliptical incision which is brought down sharply to the fascia. The fascia is identified and grasped with Coker forceps. It is incised with Mayo scissors. Peritoneum is then entered in in a blunt fashion.  A pursestring suture of 0 Vicryl is placed on the fascia and a 12 mm Hassan trocar is easily inserted in the abdominal cavity. It is held in place by its balloon which was inflated with 15 cc of air and by the pursestring suture. Allows easy insufflation of the pneumoperitoneum using warm to CO2 at a maximum pressure of 15 mm of mercury. The laparoscope was inserted in the abdominal cavity.  Observation: Anterior cul-de-sac is normal. The uterus is globulus but normal. Both tubes and both ovaries are normal. The posterior cul-de-sac is normal. Liver and gallbladder are normal. Appendix is not visualized.  We proceed with cauterization of each tube in the isthmal ampullary area on the distance of 1.5 cm including the mesosalpinx until complete blanching.  All instruments were then removed after evacuating the pneumoperitoneum. The fascia is closed with a Purstring suture of 0 Vicryl. Skin is closed with a subcuticular suture of 3-0 Monocryl and Dermabond.  The acorn intrauterine manipulator and the anterior lip tenaculum are then removed. Bleeding on the anterior lip is controlled with a figure-of-eight stitch of 2-0 Vicryl.  Instrument and sponge count is complete x2. Estimated blood loss is minimal. The procedure is well tolerated by the patient is taken to recovery room in a well and stable condition.  Specimen: Endometrial curettings  sent to pathology

## 2011-10-16 NOTE — Anesthesia Procedure Notes (Signed)
Procedure Name: Intubation Date/Time: 10/16/2011 12:06 PM Performed by: Kerby Nora Pre-anesthesia Checklist: Patient identified, Emergency Drugs available, Suction available, Patient being monitored and Timeout performed Patient Re-evaluated:Patient Re-evaluated prior to inductionOxygen Delivery Method: Circle System Utilized Preoxygenation: Pre-oxygenation with 100% oxygen Intubation Type: IV induction Ventilation: Mask ventilation without difficulty Laryngoscope Size: Mac and 3 Grade View: Grade III Tube type: Oral Tube size: 7.0 mm Number of attempts: 1 Airway Equipment and Method: patient positioned with wedge pillow and stylet Placement Confirmation: ETT inserted through vocal cords under direct vision,  positive ETCO2,  CO2 detector and breath sounds checked- equal and bilateral Secured at: 21 cm Tube secured with: Tape Dental Injury: Teeth and Oropharynx as per pre-operative assessment  Difficulty Due To: Difficulty was anticipated

## 2011-10-16 NOTE — Brief Op Note (Signed)
10/16/2011  10:59 AM  PATIENT:  [name]  36 y.o. female  PRE-OPERATIVE DIAGNOSIS:  Menorrhagia and desire for sterilization  POST-OPERATIVE DIAGNOSIS: same    Procedure(s): DILATATION & CURETTAGE/HYSTEROSCOPY WITH NOVASURE ABLATION LAPAROSCOPIC BILATERAL TUBAL LIGATION    Surgeon(s): Alwyn Pea, MD   ASSISTANTS: none   ANESTHESIA:   local and general  LOCAL MEDICATIONS USED:  MARCAINE  10 CC                    NESACAINE 15 CC  SPECIMEN:  ENDOMETRIAL CURETTINGS  DISPOSITION OF SPECIMEN:  PATHOLOGY  COUNTS:  YES  ESTIMATED BLOOD LOSS: minimal  PATIENT DISPOSITION:  PACU - hemodynamically stable.      [facility] 10/16/2011 10:59 AM

## 2011-10-16 NOTE — Anesthesia Preprocedure Evaluation (Signed)
Anesthesia Evaluation  Patient identified by MRN, date of birth, ID band Patient awake    Reviewed: Allergy & Precautions, H&P , Patient's Chart, lab work & pertinent test results, reviewed documented beta blocker date and time   Airway Mallampati: III TM Distance: >3 FB Neck ROM: full    Dental No notable dental hx.    Pulmonary Recent URI  (Bronchitis in past, none now, +smoker), Resolved,  clear to auscultation  Pulmonary exam normal       Cardiovascular regular Normal    Neuro/Psych    GI/Hepatic   Endo/Other  Morbid obesity  Renal/GU      Musculoskeletal   Abdominal   Peds  Hematology   Anesthesia Other Findings   Reproductive/Obstetrics                           Anesthesia Physical Anesthesia Plan  ASA: III  Anesthesia Plan: General   Post-op Pain Management:    Induction: Intravenous  Airway Management Planned: Oral ETT  Additional Equipment:   Intra-op Plan:   Post-operative Plan:   Informed Consent: I have reviewed the patients History and Physical, chart, labs and discussed the procedure including the risks, benefits and alternatives for the proposed anesthesia with the patient or authorized representative who has indicated his/her understanding and acceptance.   Dental Advisory Given and Dental advisory given  Plan Discussed with: CRNA and Surgeon  Anesthesia Plan Comments: (  Discussed  general anesthesia, including possible nausea, instrumentation of airway, sore throat,pulmonary aspiration, etc. I asked if the were any outstanding questions, or  concerns before we proceeded. )        Anesthesia Quick Evaluation

## 2011-10-19 ENCOUNTER — Encounter (HOSPITAL_COMMUNITY): Payer: Self-pay | Admitting: Obstetrics and Gynecology

## 2011-10-19 NOTE — Anesthesia Postprocedure Evaluation (Signed)
  Anesthesia Post-op Note  Patient: [name]  Procedure(s) Performed:  DILATATION & CURETTAGE/HYSTEROSCOPY WITH NOVASURE ABLATION; LAPAROSCOPIC TUBAL LIGATION  Patient is awake and responsive. Pain and nausea are reasonably well controlled. Vital signs are stable and clinically acceptable. Oxygen saturation is clinically acceptable. There are no apparent anesthetic complications at this time. Patient is ready for discharge.

## 2012-04-20 ENCOUNTER — Emergency Department (HOSPITAL_COMMUNITY): Payer: Medicaid Other

## 2012-04-20 ENCOUNTER — Encounter (HOSPITAL_COMMUNITY): Payer: Self-pay | Admitting: *Deleted

## 2012-04-20 ENCOUNTER — Observation Stay (HOSPITAL_COMMUNITY)
Admission: EM | Admit: 2012-04-20 | Discharge: 2012-04-21 | Disposition: A | Discharge status: Home / Self Care | Payer: Medicaid Other | Attending: Internal Medicine | Admitting: Internal Medicine

## 2012-04-20 DIAGNOSIS — F17200 Nicotine dependence, unspecified, uncomplicated: Secondary | ICD-10-CM | POA: Diagnosis present

## 2012-04-20 DIAGNOSIS — K648 Other hemorrhoids: Secondary | ICD-10-CM

## 2012-04-20 DIAGNOSIS — F329 Major depressive disorder, single episode, unspecified: Secondary | ICD-10-CM

## 2012-04-20 DIAGNOSIS — A0472 Enterocolitis due to Clostridium difficile, not specified as recurrent: Principal | ICD-10-CM | POA: Diagnosis present

## 2012-04-20 DIAGNOSIS — R109 Unspecified abdominal pain: Secondary | ICD-10-CM | POA: Insufficient documentation

## 2012-04-20 DIAGNOSIS — K625 Hemorrhage of anus and rectum: Secondary | ICD-10-CM | POA: Insufficient documentation

## 2012-04-20 DIAGNOSIS — A498 Other bacterial infections of unspecified site: Secondary | ICD-10-CM

## 2012-04-20 DIAGNOSIS — N920 Excessive and frequent menstruation with regular cycle: Secondary | ICD-10-CM

## 2012-04-20 DIAGNOSIS — E669 Obesity, unspecified: Secondary | ICD-10-CM | POA: Insufficient documentation

## 2012-04-20 DIAGNOSIS — Z6841 Body Mass Index (BMI) 40.0 and over, adult: Secondary | ICD-10-CM | POA: Insufficient documentation

## 2012-04-20 DIAGNOSIS — K529 Noninfective gastroenteritis and colitis, unspecified: Secondary | ICD-10-CM

## 2012-04-20 HISTORY — DX: Other bacterial infections of unspecified site: A49.8

## 2012-04-20 LAB — OCCULT BLOOD, POC DEVICE: Fecal Occult Bld: POSITIVE

## 2012-04-20 LAB — COMPREHENSIVE METABOLIC PANEL
ALT: 5 U/L (ref 0–35)
AST: 11 U/L (ref 0–37)
Albumin: 3.4 g/dL — ABNORMAL LOW (ref 3.5–5.2)
Alkaline Phosphatase: 72 U/L (ref 39–117)
CO2: 24 mEq/L (ref 19–32)
Chloride: 105 mEq/L (ref 96–112)
Creatinine, Ser: 1.16 mg/dL — ABNORMAL HIGH (ref 0.50–1.10)
GFR calc non Af Amer: 61 mL/min — ABNORMAL LOW (ref >90)
Potassium: 3.6 mEq/L (ref 3.5–5.1)
Sodium: 138 mEq/L (ref 135–145)
Total Bilirubin: 0.1 mg/dL — ABNORMAL LOW (ref 0.3–1.2)

## 2012-04-20 LAB — CBC WITH DIFFERENTIAL/PLATELET
Basophils Absolute: 0 10*3/uL (ref 0.0–0.1)
Basophils Relative: 0 % (ref 0–1)
HCT: 36.2 % (ref 36.0–46.0)
Lymphocytes Relative: 34 % (ref 12–46)
MCHC: 32.3 g/dL (ref 30.0–36.0)
Neutro Abs: 6.4 10*3/uL (ref 1.7–7.7)
Neutrophils Relative %: 58 % (ref 43–77)
Platelets: 301 10*3/uL (ref 150–400)
RDW: 15.7 % — ABNORMAL HIGH (ref 11.5–15.5)
WBC: 10.9 10*3/uL — ABNORMAL HIGH (ref 4.0–10.5)

## 2012-04-20 LAB — URINALYSIS, ROUTINE W REFLEX MICROSCOPIC
Bilirubin Urine: NEGATIVE
Nitrite: NEGATIVE
Specific Gravity, Urine: 1.03 (ref 1.005–1.030)
Urobilinogen, UA: 0.2 mg/dL (ref 0.0–1.0)
pH: 6 (ref 5.0–8.0)

## 2012-04-20 LAB — PREGNANCY, URINE: Preg Test, Ur: NEGATIVE

## 2012-04-20 LAB — CLOSTRIDIUM DIFFICILE BY PCR: Toxigenic C. Difficile by PCR: POSITIVE — AB

## 2012-04-20 LAB — URINE MICROSCOPIC-ADD ON

## 2012-04-20 MED ORDER — SODIUM CHLORIDE 0.9 % IV SOLN
INTRAVENOUS | Status: DC
  Administered 2012-04-20: 17:00:00 via INTRAVENOUS
  Administered 2012-04-20: 1000 mL via INTRAVENOUS

## 2012-04-20 MED ORDER — POTASSIUM CHLORIDE IN NACL 20-0.9 MEQ/L-% IV SOLN
INTRAVENOUS | Status: DC
  Administered 2012-04-20: 21:00:00 via INTRAVENOUS

## 2012-04-20 MED ORDER — DIPHENHYDRAMINE HCL 50 MG/ML IJ SOLN: 25.0000 mg | Freq: Once | INTRAMUSCULAR | Status: DC

## 2012-04-20 MED ORDER — ONDANSETRON HCL 4 MG/2ML IJ SOLN
4.0000 mg | INTRAMUSCULAR | Status: DC | PRN
  Administered 2012-04-20: 4 mg via INTRAVENOUS
  Filled 2012-04-20: qty 2

## 2012-04-20 MED ORDER — ONDANSETRON HCL 4 MG/2ML IJ SOLN: 4.0000 mg | Freq: Four times a day (QID) | INTRAMUSCULAR | Status: DC | PRN

## 2012-04-20 MED ORDER — ALUM & MAG HYDROXIDE-SIMETH 200-200-20 MG/5ML PO SUSP: 30.0000 mL | Freq: Four times a day (QID) | ORAL | Status: DC | PRN

## 2012-04-20 MED ORDER — HYDROMORPHONE HCL PF 1 MG/ML IJ SOLN: 0.5000 mg | INTRAMUSCULAR | Status: DC | PRN

## 2012-04-20 MED ORDER — SODIUM CHLORIDE 0.9 % IV SOLN: INTRAVENOUS | Status: DC

## 2012-04-20 MED ORDER — ACETAMINOPHEN 650 MG RE SUPP: 650.0000 mg | Freq: Four times a day (QID) | RECTAL | Status: DC | PRN

## 2012-04-20 MED ORDER — CIPROFLOXACIN IN D5W 400 MG/200ML IV SOLN
400.0000 mg | Freq: Once | INTRAVENOUS | Status: AC
  Administered 2012-04-20: 400 mg via INTRAVENOUS
  Filled 2012-04-20: qty 200

## 2012-04-20 MED ORDER — METRONIDAZOLE 500 MG PO TABS
500.0000 mg | ORAL_TABLET | Freq: Three times a day (TID) | ORAL | Status: DC
  Administered 2012-04-21: 500 mg via ORAL
  Filled 2012-04-20: qty 1

## 2012-04-20 MED ORDER — METRONIDAZOLE IN NACL 5-0.79 MG/ML-% IV SOLN
500.0000 mg | Freq: Once | INTRAVENOUS | Status: DC
  Filled 2012-04-20: qty 100

## 2012-04-20 MED ORDER — IOHEXOL 300 MG/ML  SOLN
100.0000 mL | Freq: Once | INTRAMUSCULAR | Status: AC | PRN
  Administered 2012-04-20: 100 mL via INTRAVENOUS

## 2012-04-20 MED ORDER — ONDANSETRON HCL 4 MG/2ML IJ SOLN: 4.0000 mg | Freq: Three times a day (TID) | INTRAMUSCULAR | Status: DC | PRN

## 2012-04-20 MED ORDER — SODIUM CHLORIDE 0.9 % IJ SOLN
INTRAMUSCULAR | Status: AC
  Filled 2012-04-20: qty 3

## 2012-04-20 MED ORDER — ACETAMINOPHEN 325 MG PO TABS: 650.0000 mg | ORAL_TABLET | Freq: Four times a day (QID) | ORAL | Status: DC | PRN

## 2012-04-20 MED ORDER — ALBUTEROL SULFATE HFA 108 (90 BASE) MCG/ACT IN AERS: 2.0000 | INHALATION_SPRAY | Freq: Four times a day (QID) | RESPIRATORY_TRACT | Status: DC | PRN

## 2012-04-20 MED ORDER — FENTANYL CITRATE 0.05 MG/ML IJ SOLN
50.0000 ug | INTRAMUSCULAR | Status: DC | PRN
  Administered 2012-04-20: 50 ug via INTRAVENOUS
  Filled 2012-04-20: qty 2

## 2012-04-20 MED ORDER — FENTANYL CITRATE 0.05 MG/ML IJ SOLN: 50.0000 ug | INTRAMUSCULAR | Status: DC | PRN

## 2012-04-20 MED ORDER — DIPHENHYDRAMINE HCL 50 MG/ML IJ SOLN
25.0000 mg | Freq: Once | INTRAMUSCULAR | Status: AC
  Administered 2012-04-20: 25 mg via INTRAVENOUS
  Filled 2012-04-20: qty 1

## 2012-04-20 MED ORDER — SODIUM CHLORIDE 0.9 % IV SOLN: 500.0000 mg | Freq: Once | INTRAVENOUS | Status: DC

## 2012-04-20 MED ORDER — ONDANSETRON HCL 4 MG PO TABS: 4.0000 mg | ORAL_TABLET | Freq: Four times a day (QID) | ORAL | Status: DC | PRN

## 2012-04-20 MED ORDER — TRAZODONE HCL 50 MG PO TABS: 50.0000 mg | ORAL_TABLET | Freq: Every evening | ORAL | Status: DC | PRN

## 2012-04-20 MED ORDER — HYDROCODONE-ACETAMINOPHEN 5-325 MG PO TABS
1.0000 | ORAL_TABLET | ORAL | Status: DC | PRN
  Administered 2012-04-20 – 2012-04-21 (×2): 2 via ORAL
  Filled 2012-04-20 (×2): qty 2

## 2012-04-20 NOTE — ED Notes (Signed)
Checked with Dr. Thurnell Garbe prior to administering cipro because pt has it listed as allergy.  EDP says ok to administer because reaction was n/v.  Pt says the cipro was PO in the past.  Pt later called and reports R arm itching when medication started.  Area slightly red.  PT says has been rubbing her arm.  Notified Dr. Thurnell Garbe and was instructed to watch her arm.

## 2012-04-20 NOTE — ED Notes (Signed)
Assisted patient to the restroom. Tolerated well.

## 2012-04-20 NOTE — ED Notes (Signed)
Pt c/o intermittent abd and lower back cramping since yesterday.  Describes as labor contractions.  Reports since yesterday has noticed dark red blood in stool.  Reports vomited x 1 yesterday and once today.

## 2012-04-20 NOTE — ED Notes (Signed)
Pt drinking contrast. 

## 2012-04-20 NOTE — ED Notes (Signed)
Pt c/o itching all over, stopped antibiotic and notified EDP.

## 2012-04-20 NOTE — ED Notes (Signed)
Pt given clear liquid tray

## 2012-04-20 NOTE — ED Provider Notes (Signed)
History     CSN: [id number]  Arrival date & time 04/20/12  54   First MD Initiated Contact with Patient 04/20/12 1100      Chief Complaint  Patient presents with  . Rectal Bleeding  . Emesis     HPI Pt was seen at 1125.  Per pt, c/o gradual onset and worsening of constant generalized abd "pain" since yesterday.  Describes the pain as "cramping."  Has been associated with multiple episodes of N/V/D and lightheadedness.  Pt describes her stools as having "red blood" in them.  Denies sick contacts, no recent abx, no recent travel.  No black or blood in emesis, no fevers, no back pain, no CP/SOB.     Past Medical History  Diagnosis Date  . Constipation   . Bronchitis     Past Surgical History  Procedure Date  . Hemrrhoid surgery   . Colonoscopy   . Laparoscopic tubal ligation 10/16/2011    Procedure: LAPAROSCOPIC TUBAL LIGATION;  Surgeon: Alwyn Pea, MD;  Location: [facility];  Service: Gynecology;  Laterality: Bilateral;  . Foot surgery     History  Substance Use Topics  . Smoking status: Current Everyday Smoker -- 0.5 packs/day for 20 years    Types: Cigarettes  . Smokeless tobacco: Not on file  . Alcohol Use: No    Review of Systems ROS: Statement: All systems negative except as marked or noted in the HPI; Constitutional: Negative for fever and chills. ; ; Eyes: Negative for eye pain, redness and discharge. ; ; ENMT: Negative for ear pain, hoarseness, nasal congestion, sinus pressure and sore throat. ; ; Cardiovascular: Negative for chest pain, palpitations, diaphoresis, dyspnea and peripheral edema. ; ; Respiratory: Negative for cough, wheezing and stridor. ; ; Gastrointestinal: +N/V/D, abd pain, blood in stool.  Negative for hematemesis, jaundice and rectal bleeding. . ; ; Genitourinary: Negative for dysuria, flank pain and hematuria. ; ; Musculoskeletal: Negative for back pain and neck pain. Negative for swelling and trauma.; ; Skin: Negative for pruritus, rash,  abrasions, blisters, bruising and skin lesion.; ; Neuro: +lightheadedness.  Negative for headache and neck stiffness. Negative for weakness, altered level of consciousness , altered mental status, extremity weakness, paresthesias, involuntary movement, seizure and syncope.      Allergies  Oxycodone; Ciprofloxacin; Doxycycline; and Penicillins  Home Medications   Current Outpatient Rx  Name Route Sig Dispense Refill  . ALBUTEROL SULFATE HFA 108 (90 BASE) MCG/ACT IN AERS Inhalation Inhale 2 puffs into the lungs every 6 (six) hours as needed. For shortness of breath     . IBUPROFEN 200 MG PO TABS Oral Take 400 mg by mouth every 8 (eight) hours as needed. For pain    . NAPROXEN SODIUM 220 MG PO TABS Oral Take 220 mg by mouth 2 (two) times daily with a meal. For pain       BP 132/78  Pulse 76  Temp 98.8 F (37.1 C) (Oral)  Resp 16  Ht 5' 6"  (1.676 m)  Wt 270 lb (122.471 kg)  BMI 43.58 kg/m2  SpO2 98%  LMP 04/06/2012  Physical Exam 1130: Physical examination:  Nursing notes reviewed; Vital signs and O2 SAT reviewed;  Constitutional: Well developed, Well nourished, Well hydrated, Uncomfortable appearing; Head:  Normocephalic, atraumatic; Eyes: EOMI, PERRL, No scleral icterus; ENMT: Mouth and pharynx normal, Mucous membranes moist; Neck: Supple, Full range of motion, No lymphadenopathy; Cardiovascular: Regular rate and rhythm, No gallop; Respiratory: Breath sounds clear & equal bilaterally, No wheezes.  Speaking full sentences with ease, Normal respiratory effort/excursion; Chest: Nontender, Movement normal; Abdomen: Soft, +LUQ, mid-epigastric, RUQ tenderness to palp.  No rebound or guarding. Nondistended, Normal bowel sounds, Rectal exam performed w/permission of pt and ED RN chaparone present.  Anal tone normal.  Non-tender, soft brown stool in rectal vault, heme positive.  No fissures, +small external hemorrhoids without thrombosis or bleeding. No palp masses;; Extremities: Pulses normal, No  tenderness, No edema, No calf edema or asymmetry.; Neuro: AA&Ox3, Major CN grossly intact.  Speech clear. No gross focal motor or sensory deficits in extremities.; Skin: Color normal, Warm, Dry.   ED Course  Procedures   MDM  MDM Reviewed: nursing note and vitals Interpretation: labs, x-ray and CT scan   Results for orders placed during the hospital encounter of 04/20/12  URINALYSIS, ROUTINE W REFLEX MICROSCOPIC      Component Value Range   Color, Urine YELLOW  YELLOW   APPearance CLEAR  CLEAR   Specific Gravity, Urine 1.030  1.005 - 1.030   pH 6.0  5.0 - 8.0   Glucose, UA NEGATIVE  NEGATIVE mg/dL   Hgb urine dipstick NEGATIVE  NEGATIVE   Bilirubin Urine NEGATIVE  NEGATIVE   Ketones, ur TRACE (*) NEGATIVE mg/dL   Protein, ur TRACE (*) NEGATIVE mg/dL   Urobilinogen, UA 0.2  0.0 - 1.0 mg/dL   Nitrite NEGATIVE  NEGATIVE   Leukocytes, UA NEGATIVE  NEGATIVE  PREGNANCY, URINE      Component Value Range   Preg Test, Ur NEGATIVE  NEGATIVE  OCCULT BLOOD, POC DEVICE      Component Value Range   Fecal Occult Bld POSITIVE    CBC WITH DIFFERENTIAL      Component Value Range   WBC 10.9 (*) 4.0 - 10.5 K/uL   RBC 4.20  3.87 - 5.11 MIL/uL   Hemoglobin 11.7 (*) 12.0 - 15.0 g/dL   HCT 36.2  36.0 - 46.0 %   MCV 86.2  78.0 - 100.0 fL   MCH 27.9  26.0 - 34.0 pg   MCHC 32.3  30.0 - 36.0 g/dL   RDW 15.7 (*) 11.5 - 15.5 %   Platelets 301  150 - 400 K/uL   Neutrophils Relative 58  43 - 77 %   Neutro Abs 6.4  1.7 - 7.7 K/uL   Lymphocytes Relative 34  12 - 46 %   Lymphs Abs 3.7  0.7 - 4.0 K/uL   Monocytes Relative 5  3 - 12 %   Monocytes Absolute 0.5  0.1 - 1.0 K/uL   Eosinophils Relative 4  0 - 5 %   Eosinophils Absolute 0.4  0.0 - 0.7 K/uL   Basophils Relative 0  0 - 1 %   Basophils Absolute 0.0  0.0 - 0.1 K/uL  COMPREHENSIVE METABOLIC PANEL      Component Value Range   Sodium 138  135 - 145 mEq/L   Potassium 3.6  3.5 - 5.1 mEq/L   Chloride 105  96 - 112 mEq/L   CO2 24  19 - 32 mEq/L    Glucose, Bld 79  70 - 99 mg/dL   BUN 12  6 - 23 mg/dL   Creatinine, Ser 1.16 (*) 0.50 - 1.10 mg/dL   Calcium 9.4  8.4 - 10.5 mg/dL   Total Protein 7.1  6.0 - 8.3 g/dL   Albumin 3.4 (*) 3.5 - 5.2 g/dL   AST 11  0 - 37 U/L   ALT 5  0 - 35  U/L   Alkaline Phosphatase 72  39 - 117 U/L   Total Bilirubin 0.1 (*) 0.3 - 1.2 mg/dL   GFR calc non Af Amer 61 (*) >90 mL/min   GFR calc Af Amer 70 (*) >90 mL/min  LIPASE, BLOOD      Component Value Range   Lipase 18  11 - 59 U/L  URINE MICROSCOPIC-ADD ON      Component Value Range   Squamous Epithelial / LPF FEW (*) RARE    Ct Abdomen Pelvis W Contrast 04/20/2012  *RADIOLOGY REPORT*  Clinical Data: Abdominal pain and constipation.  CT ABDOMEN AND PELVIS WITH CONTRAST  Technique:  Multidetector CT imaging of the abdomen and pelvis was performed following the standard protocol during bolus administration of intravenous contrast.  Contrast: 179m OMNIPAQUE IOHEXOL 300 MG/ML  SOLN  Comparison: None.  Findings: The abdominal parenchymal organs are normal in appearance.  Gallbladder is unremarkable.  No evidence of hydronephrosis.  No soft tissue masses are identified within the abdomen or pelvis.  Uterus and adnexa are unremarkable in appearance.  Normal appendix is visualized.  Colonic wall thickening is seen involving the ascending and transverse portions of the colon with sparing of the left colon.  There is no evidence of involving the terminal ileum or small bowel.  No evidence of bowel obstruction. This is consistent with colitis.  There is no evidence of abscess or free fluid. Shotty less than 1 cm mesenteric lymph nodes are seen in the right abdomen, which are likely reactive in etiology.  IMPRESSION:  1.  Moderate colitis involving the ascending and transverse portions of the colon.  Differential diagnosis includes infectious etiologies, or less likely, ulcerative or Crohn's colitis.  Original Report Authenticated By: JMarlaine Hind M.D.      1425:   Pt states she still feels "miserable" despite multiple doses of IV pain meds.  Afebrile, VSS.  Has gotten up to use the bathroom to give stool sample for cdiff and stool culture, but states she is only passing "globs of red mucus" and "stringy clots."  No hx UC or Crohn's.  States she has had colonoscopy previously, "but they just saw a polyp they took out."  Will start IV cipro ("allergy" is N/V) and flagyl.  T/C to [facility]. SConley Canal case discussed, including:  HPI, pertinent PM/SHx, VS/PE, dx testing, ED course and treatment:  Agreeable to observation admit, requests to write temporary orders, obtain medical bed to team 1.  1505:  Called into pt room, pt with itching and red rash extending up her right arm from IV site. No resp distress, speaking full sentences with ease, VSS.  Will d/c cipro, dose IV benadryl.  T/C to Pharmacist, case discussed, given pt's multiple abx allergies, recommends to dose Primaxin 5064mIV for colitis.      KaAlfonzo FellerDO 04/20/12 1901

## 2012-04-20 NOTE — ED Notes (Signed)
Patient is resting comfortably. 

## 2012-04-20 NOTE — H&P (Signed)
Hospital Admission Note Date: 04/20/2012  Patient name: [name] Medical record number: [id number] Date of birth: 05-28-1976 Age: 36 y.o. Gender: female PCP: none  Attending physician: Delfina Redwood, MD  Chief Complaint: abdominal pain and bloody diarrhea  History of Present Illness:  [name] is an 36 y.o. female who presents with 2-3 days worth of left-sided abdominal pain, frequent bloody, mucousy diarrhea. She has had no fevers or chills. She has been nauseated but is currently requesting supper. She does not recall having had antibiotics recently. She has no recent travel or sick contacts. She has never had similar symptoms. In the emergency room, she had a CT of the abdomen which shows descending and transverse colitis. Her white blood cell count is 10,000. She was given a dose of Cipro and had an allergic reaction consisting of hives. She received Benadryl and the Cipro was stopped. Since then, a stool for C. difficile has come back positive. Patient has no previous history of same.  Past Medical History  Diagnosis Date  . Constipation   . Bronchitis     Meds: Prescriptions prior to admission  Medication Sig Dispense Refill  . albuterol (PROVENTIL HFA;VENTOLIN HFA) 108 (90 BASE) MCG/ACT inhaler Inhale 2 puffs into the lungs every 6 (six) hours as needed. For shortness of breath       . ibuprofen (ADVIL,MOTRIN) 200 MG tablet Take 400 mg by mouth every 8 (eight) hours as needed. For pain      . naproxen sodium (ANAPROX) 220 MG tablet Take 220 mg by mouth 2 (two) times daily with a meal. For pain         Allergies: Oxycodone; Ciprofloxacin; Doxycycline; and Penicillins History   Social History  . Marital Status: Single    Spouse Name: N/A    Number of Children: N/A  . Years of Education: N/A   Occupational History  . Not on file.   Social History Main Topics  . Smoking status: Current Everyday Smoker -- 0.5 packs/day for 20 years    Types: Cigarettes   . Smokeless tobacco: Current User  . Alcohol Use: No  . Drug Use: No  . Sexually Active: Yes    Birth Control/ Protection: Condom   Other Topics Concern  . Not on file   Social History Narrative  . No narrative on file   Family history: Parents both have diabetes hypertension and asthma. One of her children has a glioma. Several of her children have asthma.  Past Surgical History  Procedure Date  . Hemrrhoid surgery   . Colonoscopy   . Laparoscopic tubal ligation 10/16/2011    Procedure: LAPAROSCOPIC TUBAL LIGATION;  Surgeon: Alwyn Pea, MD;  Location: [facility];  Service: Gynecology;  Laterality: Bilateral;  . Foot surgery     Review of Systems: Systems reviewed and as per HPI, otherwise negative.  Physical Exam: Blood pressure 117/94, pulse 62, temperature 98.5 F (36.9 C), temperature source Oral, resp. rate 18, height 5' 6"  (1.676 m), weight 122.471 kg (270 lb), last menstrual period 04/06/2012, SpO2 100.00%. BP 117/94  Pulse 62  Temp 98.5 F (36.9 C) (Oral)  Resp 18  Ht 5' 6"  (1.676 m)  Wt 122.471 kg (270 lb)  BMI 43.58 kg/m2  SpO2 100%  LMP 04/06/2012  General Appearance:    Alert, cooperative, no distress, appears stated age  Head:    Normocephalic, without obvious abnormality, atraumatic  Eyes:    PERRL, conjunctiva/corneas clear, EOM's intact, fundi  benign, both eyes  Ears:    Normal TM's and external ear canals, both ears  Nose:   Nares normal, septum midline, mucosa normal, no drainage    or sinus tenderness  Throat:   Lips, mucosa, and tongue normal; teeth and gums normal  Neck:   Supple, symmetrical, trachea midline, no adenopathy;    thyroid:  no enlargement/tenderness/nodules; no carotid   bruit or JVD  Back:     Symmetric, no curvature, ROM normal, no CVA tenderness  Lungs:     Clear to auscultation bilaterally, respirations unlabored  Chest Wall:    No tenderness or deformity   Heart:    Regular rate and rhythm, S1 and S2 normal, no murmur,  rub   or gallop     Abdomen:     Soft, obese, left-sided tenderness, bowel sounds normal   Genitalia:   deferred   Rectal:   heme positive per ED physician   Extremities:   Extremities normal, atraumatic, no cyanosis or edema  Pulses:   2+ and symmetric all extremities  Skin:   Skin color, texture, turgor normal, no rashes or lesions  Lymph nodes:   Cervical, supraclavicular, and axillary nodes normal  Neurologic:   CNII-XII intact, normal strength, sensation and reflexes    throughout    Psychiatric: Normal affect.  Lab results: Basic Metabolic Panel:  Basename 04/20/12 1140  NA 138  K 3.6  CL 105  CO2 24  GLUCOSE 79  BUN 12  CREATININE 1.16*  CALCIUM 9.4  MG --  PHOS --   Liver Function Tests:  Basename 04/20/12 1140  AST 11  ALT 5  ALKPHOS 72  BILITOT 0.1*  PROT 7.1  ALBUMIN 3.4*    Basename 04/20/12 1140  LIPASE 18  AMYLASE --   No results found for this basename: AMMONIA:2 in the last 72 hours CBC:  Basename 04/20/12 1140  WBC 10.9*  NEUTROABS 6.4  HGB 11.7*  HCT 36.2  MCV 86.2  PLT 301   Cardiac Enzymes: No results found for this basename: CKTOTAL:3,CKMB:3,CKMBINDEX:3,TROPONINI:3 in the last 72 hours BNP: No results found for this basename: PROBNP:3 in the last 72 hours D-Dimer: No results found for this basename: DDIMER:2 in the last 72 hours CBG: No results found for this basename: GLUCAP:6 in the last 72 hours Hemoglobin A1C: No results found for this basename: HGBA1C in the last 72 hours Fasting Lipid Panel: No results found for this basename: CHOL,HDL,LDLCALC,TRIG,CHOLHDL,LDLDIRECT in the last 72 hours Thyroid Function Tests: No results found for this basename: TSH,T4TOTAL,FREET4,T3FREE,THYROIDAB in the last 72 hours Anemia Panel: No results found for this basename: VITAMINB12,FOLATE,FERRITIN,TIBC,IRON,RETICCTPCT in the last 72 hours Coagulation: No results found for this basename: LABPROT:2,INR:2 in the last 72 hours Urine Drug  Screen: Drugs of Abuse  No results found for this basename: labopia, cocainscrnur, labbenz, amphetmu, thcu, labbarb    Alcohol Level: No results found for this basename: ETH:2 in the last 72 hours Urinalysis:  Basename 04/20/12 1141  COLORURINE YELLOW  LABSPEC 1.030  PHURINE 6.0  [place]*  UROBILINOGEN 0.2  NITRITE NEGATIVE  LEUKOCYTESUR NEGATIVE   Imaging results:  Ct Abdomen Pelvis W Contrast  04/20/2012  *RADIOLOGY REPORT*  Clinical Data: Abdominal pain and constipation.  CT ABDOMEN AND PELVIS WITH CONTRAST  Technique:  Multidetector CT imaging of the abdomen and pelvis was performed following the standard protocol during bolus administration of intravenous contrast.  Contrast: 118m OMNIPAQUE  IOHEXOL 300 MG/ML  SOLN  Comparison: None.  Findings: The abdominal parenchymal organs are normal in appearance.  Gallbladder is unremarkable.  No evidence of hydronephrosis.  No soft tissue masses are identified within the abdomen or pelvis.  Uterus and adnexa are unremarkable in appearance.  Normal appendix is visualized.  Colonic wall thickening is seen involving the ascending and transverse portions of the colon with sparing of the left colon.  There is no evidence of involving the terminal ileum or small bowel.  No evidence of bowel obstruction. This is consistent with colitis.  There is no evidence of abscess or free fluid. Shotty less than 1 cm mesenteric lymph nodes are seen in the right abdomen, which are likely reactive in etiology.  IMPRESSION:  1.  Moderate colitis involving the ascending and transverse portions of the colon.  Differential diagnosis includes infectious etiologies, or less likely, ulcerative or Crohn's colitis.  Original Report Authenticated By: Marlaine Hind, M.D.    Assessment & Plan: Principal Problem:  *C. difficile colitis Active Problems:  Obesity, unspecified  TOBACCO  USER  Patient will be placed on observation. Start on Flagyl by mouth. Supportive care. If stable, will likely be able to go home tomorrow.  [place] L 04/20/2012, 6:40 PM

## 2012-04-20 NOTE — ED Notes (Signed)
Pt states she first noticed bright red blood with BM's yesterday. States N/V and dizziness also began yesterday, along with back and abdominal pain, described as "contraction" pain. NAD. Pt states just PTA, she felt like she had to have BM "but was all blood" bright red, with "stringy" clots.

## 2012-04-20 NOTE — ED Notes (Signed)
Waiting for bed assignment  

## 2012-04-20 NOTE — ED Notes (Signed)
Patient does not need anything at this time.

## 2012-04-20 NOTE — ED Notes (Signed)
CRITICAL VALUE ALERT  Critical value received:  Positive for C DIFF PCR  Date of notification:  04/20/2012  Time of notification:  3846  Critical value read back:yes  Nurse who received alert:  c Eldana Isip rn  MD notified (1st page):  Dr Conley Canal  Time of first page:  1655  MD notified (2nd page):  Time of second page:  Responding MD:  Dr Conley Canal  Time MD responded:  1655

## 2012-04-21 MED ORDER — ACETAMINOPHEN 325 MG PO TABS: 650.0000 mg | ORAL_TABLET | Freq: Four times a day (QID) | ORAL | Status: DC | PRN

## 2012-04-21 MED ORDER — METRONIDAZOLE 500 MG PO TABS: 500.0000 mg | ORAL_TABLET | Freq: Three times a day (TID) | ORAL | Status: AC

## 2012-04-21 MED ORDER — HYDROCODONE-ACETAMINOPHEN 5-325 MG PO TABS: 1.0000 | ORAL_TABLET | ORAL | Status: DC | PRN

## 2012-04-21 NOTE — Progress Notes (Signed)
UR Chart Review Completed  

## 2012-04-21 NOTE — Progress Notes (Signed)
[facility] TELEMETRY UNIT [address] [place] [address]  April 21, 2012  Patient: [name]  Date of Birth: 1976-07-07  Date of Visit: 04/20/2012    To Whom It May Concern:  [name] was seen and treated in our emergency department on 04/20/2012. ILMA ACHEE  may return to work on 04/28/12.  Sincerely,      Doree Barthel, M.D.

## 2012-04-21 NOTE — Progress Notes (Signed)
Patient given discharge instructions,verbalized understanding of d/c instructions, RX's, and when to follow up with MD. Mee Hives, Tivis Ringer

## 2012-04-21 NOTE — Discharge Summary (Signed)
Physician Discharge Summary  Patient ID: [name] MRN: [id number] DOB/AGE: January 07, 1976 36 y.o.  Admit date: 04/20/2012 Discharge date: 04/21/2012  Discharge Diagnoses:  Principal Problem:  *C. difficile colitis Active Problems:  Obesity, unspecified  TOBACCO USER   Medication List  As of 04/21/2012 10:44 AM   STOP taking these medications         ibuprofen 200 MG tablet      naproxen sodium 220 MG tablet         TAKE these medications         acetaminophen 325 MG tablet   Commonly known as: TYLENOL   Take 2 tablets (650 mg total) by mouth every 6 (six) hours as needed for pain or fever.      albuterol 108 (90 BASE) MCG/ACT inhaler   Commonly known as: PROVENTIL HFA;VENTOLIN HFA   Inhale 2 puffs into the lungs every 6 (six) hours as needed. For shortness of breath      HYDROcodone-acetaminophen 5-325 MG per tablet   Commonly known as: NORCO/VICODIN   Take 1-2 tablets by mouth every 4 (four) hours as needed.      metroNIDAZOLE 500 MG tablet   Commonly known as: FLAGYL   Take 1 tablet (500 mg total) by mouth every 8 (eight) hours.            Discharge Orders    Future Orders Please Complete By Expires   Increase activity slowly      Discharge instructions      Comments:   Drink plenty of fluid     Disposition: 01-Home or Self Care  Discharged Condition:  Stable  Consults:  none  Labs:   Results for orders placed during the hospital encounter of 04/20/12 (from the past 48 hour(s))  OCCULT BLOOD, POC DEVICE     Status: Normal   Collection Time   04/20/12 11:35 AM      Component Value Range Comment   Fecal Occult Bld POSITIVE     CBC WITH DIFFERENTIAL     Status: Abnormal   Collection Time   04/20/12 11:40 AM      Component Value Range Comment   WBC 10.9 (*) 4.0 - 10.5 K/uL    RBC 4.20  3.87 - 5.11 MIL/uL    Hemoglobin 11.7 (*) 12.0 - 15.0 g/dL    HCT 36.2  36.0 - 46.0 %    MCV 86.2  78.0 - 100.0 fL    MCH 27.9  26.0 - 34.0 pg    MCHC 32.3   30.0 - 36.0 g/dL    RDW 15.7 (*) 11.5 - 15.5 %    Platelets 301  150 - 400 K/uL    Neutrophils Relative 58  43 - 77 %    Neutro Abs 6.4  1.7 - 7.7 K/uL    Lymphocytes Relative 34  12 - 46 %    Lymphs Abs 3.7  0.7 - 4.0 K/uL    Monocytes Relative 5  3 - 12 %    Monocytes Absolute 0.5  0.1 - 1.0 K/uL    Eosinophils Relative 4  0 - 5 %    Eosinophils Absolute 0.4  0.0 - 0.7 K/uL    Basophils Relative 0  0 - 1 %    Basophils Absolute 0.0  0.0 - 0.1 K/uL   COMPREHENSIVE METABOLIC PANEL     Status: Abnormal   Collection Time   04/20/12 11:40 AM      Component Value Range Comment  Sodium 138  135 - 145 mEq/L    Potassium 3.6  3.5 - 5.1 mEq/L    Chloride 105  96 - 112 mEq/L    CO2 24  19 - 32 mEq/L    Glucose, Bld 79  70 - 99 mg/dL    BUN 12  6 - 23 mg/dL    Creatinine, Ser 1.16 (*) 0.50 - 1.10 mg/dL    Calcium 9.4  8.4 - 10.5 mg/dL    Total Protein 7.1  6.0 - 8.3 g/dL    Albumin 3.4 (*) 3.5 - 5.2 g/dL    AST 11  0 - 37 U/L    ALT 5  0 - 35 U/L    Alkaline Phosphatase 72  39 - 117 U/L    Total Bilirubin 0.1 (*) 0.3 - 1.2 mg/dL    GFR calc non Af Amer 61 (*) >90 mL/min    GFR calc Af Amer 70 (*) >90 mL/min   LIPASE, BLOOD     Status: Normal   Collection Time   04/20/12 11:40 AM      Component Value Range Comment   Lipase 18  11 - 59 U/L   URINALYSIS, ROUTINE W REFLEX MICROSCOPIC     Status: Abnormal   Collection Time   04/20/12 11:41 AM      Component Value Range Comment   Color, Urine YELLOW  YELLOW    APPearance CLEAR  CLEAR    Specific Gravity, Urine 1.030  1.005 - 1.030    pH 6.0  5.0 - 8.0    Glucose, UA NEGATIVE  NEGATIVE mg/dL    Hgb urine dipstick NEGATIVE  NEGATIVE    Bilirubin Urine NEGATIVE  NEGATIVE    Ketones, ur TRACE (*) NEGATIVE mg/dL    Protein, ur TRACE (*) NEGATIVE mg/dL    Urobilinogen, UA 0.2  0.0 - 1.0 mg/dL    Nitrite NEGATIVE  NEGATIVE    Leukocytes, UA NEGATIVE  NEGATIVE   PREGNANCY, URINE     Status: Normal   Collection Time   04/20/12 11:41 AM       Component Value Range Comment   Preg Test, Ur NEGATIVE  NEGATIVE   URINE MICROSCOPIC-ADD ON     Status: Abnormal   Collection Time   04/20/12 11:41 AM      Component Value Range Comment   Squamous Epithelial / LPF FEW (*) RARE   CLOSTRIDIUM DIFFICILE BY PCR     Status: Abnormal   Collection Time   04/20/12  2:17 PM      Component Value Range Comment   C difficile by pcr POSITIVE (*) NEGATIVE     Diagnostics:  Ct Abdomen Pelvis W Contrast  04/20/2012  *RADIOLOGY REPORT*  Clinical Data: Abdominal pain and constipation.  CT ABDOMEN AND PELVIS WITH CONTRAST  Technique:  Multidetector CT imaging of the abdomen and pelvis was performed following the standard protocol during bolus administration of intravenous contrast.  Contrast: 152m OMNIPAQUE IOHEXOL 300 MG/ML  SOLN  Comparison: None.  Findings: The abdominal parenchymal organs are normal in appearance.  Gallbladder is unremarkable.  No evidence of hydronephrosis.  No soft tissue masses are identified within the abdomen or pelvis.  Uterus and adnexa are unremarkable in appearance.  Normal appendix is visualized.  Colonic wall thickening is seen involving the ascending and transverse portions of the colon with sparing of the left colon.  There is no evidence of involving the terminal ileum or small bowel.  No evidence of bowel  obstruction. This is consistent with colitis.  There is no evidence of abscess or free fluid. Shotty less than 1 cm mesenteric lymph nodes are seen in the right abdomen, which are likely reactive in etiology.  IMPRESSION:  1.  Moderate colitis involving the ascending and transverse portions of the colon.  Differential diagnosis includes infectious etiologies, or less likely, ulcerative or Crohn's colitis.  Original Report Authenticated By: [name], M.D.   Hospital Course: [name] is a 36 year old unassigned black female who presented with several days of left-sided abdominal pain and bloody, mucousy diarrhea. She denied  having been on antibiotics recently. She denies sick contacts. She's not been hospitalized recently. CAT scan showed a standing and transverse colitis. C. difficile PCR came back positive. 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 She was observed overnight. Started on oral Flagyl and supportive care. Initially, she was started on a clear liquid diet, but demanded to be advanced and actually ate a big Mac and fries the night of admission. She still has some pain, but her stool frequency is decreasing. She is stable for outpatient treatment of her C. difficile colitis. I've asked care management to assist with followup at the free clinic and medication assistance if needed.  Discharge Exam:  Blood pressure 103/66, pulse 65, temperature 98.3 F (36.8 C), temperature source Oral, resp. rate 18, height 5' 6"  (1.676 m), weight 129.6 kg (285 lb 11.5 oz), last menstrual period 04/06/2012, SpO2 99.00%.  Unchanged from 04/20/2012  Signed: Doree Barthel L 04/21/2012, 10:44 AM

## 2012-04-24 LAB — STOOL CULTURE

## 2012-04-26 ENCOUNTER — Observation Stay (HOSPITAL_COMMUNITY)
Admission: EM | Admit: 2012-04-26 | Discharge: 2012-04-27 | Disposition: A | Discharge status: Home / Self Care | Payer: Medicaid Other | Attending: Internal Medicine | Admitting: Internal Medicine

## 2012-04-26 ENCOUNTER — Encounter (HOSPITAL_COMMUNITY): Payer: Self-pay | Admitting: *Deleted

## 2012-04-26 DIAGNOSIS — R1010 Upper abdominal pain, unspecified: Secondary | ICD-10-CM | POA: Diagnosis present

## 2012-04-26 DIAGNOSIS — A0472 Enterocolitis due to Clostridium difficile, not specified as recurrent: Secondary | ICD-10-CM

## 2012-04-26 DIAGNOSIS — E876 Hypokalemia: Secondary | ICD-10-CM | POA: Diagnosis present

## 2012-04-26 DIAGNOSIS — E860 Dehydration: Secondary | ICD-10-CM | POA: Insufficient documentation

## 2012-04-26 DIAGNOSIS — K625 Hemorrhage of anus and rectum: Secondary | ICD-10-CM | POA: Insufficient documentation

## 2012-04-26 DIAGNOSIS — R197 Diarrhea, unspecified: Secondary | ICD-10-CM | POA: Insufficient documentation

## 2012-04-26 DIAGNOSIS — J45909 Unspecified asthma, uncomplicated: Secondary | ICD-10-CM | POA: Diagnosis present

## 2012-04-26 DIAGNOSIS — R109 Unspecified abdominal pain: Secondary | ICD-10-CM | POA: Insufficient documentation

## 2012-04-26 DIAGNOSIS — F17200 Nicotine dependence, unspecified, uncomplicated: Secondary | ICD-10-CM | POA: Insufficient documentation

## 2012-04-26 DIAGNOSIS — F329 Major depressive disorder, single episode, unspecified: Secondary | ICD-10-CM

## 2012-04-26 DIAGNOSIS — K648 Other hemorrhoids: Secondary | ICD-10-CM | POA: Insufficient documentation

## 2012-04-26 DIAGNOSIS — N920 Excessive and frequent menstruation with regular cycle: Secondary | ICD-10-CM

## 2012-04-26 DIAGNOSIS — E669 Obesity, unspecified: Secondary | ICD-10-CM

## 2012-04-26 DIAGNOSIS — Z6841 Body Mass Index (BMI) 40.0 and over, adult: Secondary | ICD-10-CM | POA: Insufficient documentation

## 2012-04-26 HISTORY — DX: Unspecified asthma, uncomplicated: J45.909

## 2012-04-26 LAB — CBC WITH DIFFERENTIAL/PLATELET
Basophils Absolute: 0 10*3/uL (ref 0.0–0.1)
Basophils Relative: 0 % (ref 0–1)
Eosinophils Relative: 2 % (ref 0–5)
HCT: 39.1 % (ref 36.0–46.0)
Hemoglobin: 12.6 g/dL (ref 12.0–15.0)
Lymphocytes Relative: 26 % (ref 12–46)
MCHC: 32.2 g/dL (ref 30.0–36.0)
MCV: 86.5 fL (ref 78.0–100.0)
Monocytes Absolute: 0.4 10*3/uL (ref 0.1–1.0)
Monocytes Relative: 3 % (ref 3–12)
Neutro Abs: 8.4 10*3/uL — ABNORMAL HIGH (ref 1.7–7.7)
RDW: 15.3 % (ref 11.5–15.5)

## 2012-04-26 LAB — COMPREHENSIVE METABOLIC PANEL
AST: 11 U/L (ref 0–37)
BUN: 9 mg/dL (ref 6–23)
CO2: 25 mEq/L (ref 19–32)
Calcium: 9 mg/dL (ref 8.4–10.5)
Chloride: 106 mEq/L (ref 96–112)
Creatinine, Ser: 1.01 mg/dL (ref 0.50–1.10)
GFR calc non Af Amer: 72 mL/min — ABNORMAL LOW (ref >90)
Total Bilirubin: 0.2 mg/dL — ABNORMAL LOW (ref 0.3–1.2)

## 2012-04-26 MED ORDER — METRONIDAZOLE 500 MG PO TABS
500.0000 mg | ORAL_TABLET | Freq: Three times a day (TID) | ORAL | Status: DC
  Administered 2012-04-26 – 2012-04-27 (×2): 500 mg via ORAL
  Filled 2012-04-26 (×3): qty 1

## 2012-04-26 MED ORDER — ACETAMINOPHEN 325 MG PO TABS: 650.0000 mg | ORAL_TABLET | ORAL | Status: DC | PRN

## 2012-04-26 MED ORDER — SODIUM CHLORIDE 0.9 % IJ SOLN
INTRAMUSCULAR | Status: AC
  Filled 2012-04-26: qty 3

## 2012-04-26 MED ORDER — HYDROMORPHONE HCL PF 1 MG/ML IJ SOLN
0.5000 mg | INTRAMUSCULAR | Status: DC | PRN
  Administered 2012-04-26 – 2012-04-27 (×2): 1 mg via INTRAVENOUS
  Filled 2012-04-26 (×2): qty 1

## 2012-04-26 MED ORDER — ALBUTEROL SULFATE HFA 108 (90 BASE) MCG/ACT IN AERS: 2.0000 | INHALATION_SPRAY | Freq: Four times a day (QID) | RESPIRATORY_TRACT | Status: DC | PRN

## 2012-04-26 MED ORDER — POTASSIUM CHLORIDE IN NACL 40-0.9 MEQ/L-% IV SOLN
INTRAVENOUS | Status: AC
  Filled 2012-04-26: qty 1000

## 2012-04-26 MED ORDER — ACETAMINOPHEN 650 MG RE SUPP: 650.0000 mg | Freq: Four times a day (QID) | RECTAL | Status: DC | PRN

## 2012-04-26 MED ORDER — VANCOMYCIN 50 MG/ML ORAL SOLUTION
250.0000 mg | Freq: Four times a day (QID) | ORAL | Status: DC
  Administered 2012-04-26 – 2012-04-27 (×4): 250 mg via ORAL
  Filled 2012-04-26 (×8): qty 5

## 2012-04-26 MED ORDER — POTASSIUM CHLORIDE CRYS ER 20 MEQ PO TBCR
30.0000 meq | EXTENDED_RELEASE_TABLET | Freq: Once | ORAL | Status: AC
  Administered 2012-04-26: 30 meq via ORAL
  Filled 2012-04-26: qty 2

## 2012-04-26 MED ORDER — POTASSIUM CHLORIDE IN NACL 40-0.9 MEQ/L-% IV SOLN
INTRAVENOUS | Status: AC
  Filled 2012-04-26: qty 2000

## 2012-04-26 MED ORDER — SODIUM CHLORIDE 0.9 % IV BOLUS (SEPSIS)
1000.0000 mL | Freq: Once | INTRAVENOUS | Status: AC
  Administered 2012-04-26: 1000 mL via INTRAVENOUS

## 2012-04-26 MED ORDER — TRAZODONE HCL 50 MG PO TABS: 50.0000 mg | ORAL_TABLET | Freq: Every evening | ORAL | Status: DC | PRN

## 2012-04-26 MED ORDER — POTASSIUM CHLORIDE IN NACL 40-0.9 MEQ/L-% IV SOLN
INTRAVENOUS | Status: DC
  Administered 2012-04-26: 150 mL/h via INTRAVENOUS
  Filled 2012-04-26 (×7): qty 1000

## 2012-04-26 MED ORDER — ENOXAPARIN SODIUM 40 MG/0.4ML ~~LOC~~ SOLN
40.0000 mg | SUBCUTANEOUS | Status: DC
  Administered 2012-04-26: 40 mg via SUBCUTANEOUS
  Filled 2012-04-26: qty 0.4

## 2012-04-26 MED ORDER — KETOROLAC TROMETHAMINE 30 MG/ML IJ SOLN
30.0000 mg | Freq: Once | INTRAMUSCULAR | Status: AC
  Administered 2012-04-26: 30 mg via INTRAVENOUS
  Filled 2012-04-26: qty 1

## 2012-04-26 NOTE — ED Provider Notes (Cosign Needed)
History     CSN: [id number]  Arrival date & time 04/26/12  1533   First MD Initiated Contact with Patient 04/26/12 1555      Chief Complaint  Patient presents with  . Rectal Bleeding    (Consider location/radiation/quality/duration/timing/severity/associated sxs/prior treatment) Patient is a 36 y.o. female presenting with hematochezia. The history is provided by the patient (the pt complains of abd pain and diarhea). No language interpreter was used.  Rectal Bleeding  The current episode started 2 days ago. The onset was gradual. The problem occurs frequently. The problem has been unchanged. The pain is moderate. The stool is described as liquid. There was no prior successful therapy. Associated symptoms include abdominal pain and diarrhea. Pertinent negatives include no hematuria, no chest pain, no headaches, no coughing and no rash.    Past Medical History  Diagnosis Date  . Constipation   . Bronchitis     Past Surgical History  Procedure Date  . Hemrrhoid surgery   . Colonoscopy   . Laparoscopic tubal ligation 10/16/2011    Procedure: LAPAROSCOPIC TUBAL LIGATION;  Surgeon: Alwyn Pea, MD;  Location: [facility];  Service: Gynecology;  Laterality: Bilateral;  . Foot surgery     History reviewed. No pertinent family history.  History  Substance Use Topics  . Smoking status: Current Everyday Smoker -- 0.5 packs/day for 20 years    Types: Cigarettes  . Smokeless tobacco: Current User  . Alcohol Use: No    OB History    Grav Para Term Preterm Abortions TAB SAB Ect Mult Living                  Review of Systems  Constitutional: Negative for fatigue.  HENT: Negative for congestion, sinus pressure and ear discharge.   Eyes: Negative for discharge.  Respiratory: Negative for cough.   Cardiovascular: Negative for chest pain.  Gastrointestinal: Positive for abdominal pain, diarrhea and hematochezia.  Genitourinary: Negative for frequency and hematuria.    Musculoskeletal: Negative for back pain.  Skin: Negative for rash.  Neurological: Negative for seizures and headaches.  Hematological: Negative.   Psychiatric/Behavioral: Negative for hallucinations.    Allergies  Oxycodone; Ciprofloxacin; Doxycycline; and Penicillins  Home Medications   Current Outpatient Rx  Name Route Sig Dispense Refill  . ALBUTEROL SULFATE HFA 108 (90 BASE) MCG/ACT IN AERS Inhalation Inhale 2 puffs into the lungs every 6 (six) hours as needed. For shortness of breath     . HYDROCODONE-ACETAMINOPHEN 5-325 MG PO TABS Oral Take 1-2 tablets by mouth every 4 (four) hours as needed. 30 tablet 0  . METRONIDAZOLE 500 MG PO TABS Oral Take 1 tablet (500 mg total) by mouth every 8 (eight) hours. 28 tablet 0    BP 107/56  Pulse 86  Temp 98.3 F (36.8 C) (Oral)  Resp 17  Ht 5' 7"  (1.702 m)  Wt 280 lb (127.007 kg)  BMI 43.85 kg/m2  SpO2 100%  LMP 04/06/2012  Physical Exam  Constitutional: She is oriented to person, place, and time. She appears well-developed.  HENT:  Head: Normocephalic and atraumatic.  Eyes: Conjunctivae and EOM are normal. No scleral icterus.  Neck: Neck supple. No thyromegaly present.  Cardiovascular: Normal rate and regular rhythm.  Exam reveals no gallop and no friction rub.   No murmur heard. Pulmonary/Chest: No stridor. She has no wheezes. She has no rales. She exhibits no tenderness.  Abdominal: She exhibits no distension. There is tenderness. There is no rebound.  Musculoskeletal: Normal range  of motion. She exhibits no edema.  Lymphadenopathy:    She has no cervical adenopathy.  Neurological: She is oriented to person, place, and time. Coordination normal.  Skin: No rash noted. No erythema.  Psychiatric: She has a normal mood and affect. Her behavior is normal.    ED Course  Procedures (including critical care time)  Labs Reviewed  CBC WITH DIFFERENTIAL - Abnormal; Notable for the following:    WBC 12.2 (*)     Neutro Abs 8.4  (*)     All other components within normal limits  COMPREHENSIVE METABOLIC PANEL - Abnormal; Notable for the following:    Potassium 3.3 (*)     Glucose, Bld 144 (*)     Total Bilirubin 0.2 (*)     GFR calc non Af Amer 72 (*)     GFR calc Af Amer 83 (*)     All other components within normal limits   No results found.   1. Abdominal pain       MDM          Maudry Diego, MD 04/26/12 [phone]

## 2012-04-26 NOTE — ED Notes (Signed)
Up to bathroom for bm. Blood noted in commode

## 2012-04-26 NOTE — H&P (Signed)
[facility] History and Physical  [place] BZJ:[id number] DOB: 08/22/1976 DOA: 04/26/2012   PCP:   No primary provider on file.   Chief Complaint:  Worsening abdominal pain and x 2 days  HPI: [name] is an 36 y.o. female.  With a past history of hematochezia related to colonic polyps and internal hemorrhoids was discharged from the hospitalist service 5 days ago after initiation of treatment for C. difficile colitis. She has been taking her Flagyl regularly without fail and had been improving until 2 days ago when she started having worsening bloody diarrhea and severe abdominal cramping. She is been having no vomiting and has been on a regular diet.  The pain was so severe and the bleeding source. She came to the emergency room today and the hospitalist service was called to assist with evaluation.  She denies fever or chills; denies frequency or dysuria; She flares of her asthma about once per month.  In the emergency room her significant lab abnormalities were a white count of 12.2 and a potassium of 3.3.  Rewiew of Systems:   All systems negative except as marked bold or noted in the HPI;  Constitutional: Negative for malaise, fever and chills. ;  Eyes: Negative for eye pain, redness and discharge. ;  ENMT: Negative for ear pain, hoarseness, nasal congestion, sinus pressure and sore throat. ;  Cardiovascular: Negative for chest pain, palpitations, diaphoresis, dyspnea and peripheral edema. ;  Respiratory: Negative for cough, hemoptysis, wheezing and stridor. ;  Gastrointestinal: See history of present illness.   Genitourinary: Negative for frequency, dysuria, incontinence,flank pain and hematuria; Musculoskeletal: Negative for back pain and neck pain. Negative for swelling and trauma.;  Skin: . Negative for pruritus, rash, abrasions, bruising and skin lesion.; ulcerations Neuro: Negative for headache, lightheadedness and neck stiffness. Negative for  weakness, altered level of consciousness , altered mental status, extremity weakness, burning feet, involuntary movement, seizure and syncope.  Psych: negative for anxiety, depression, insomnia, tearfulness, panic attacks, hallucinations, paranoia, suicidal or homicidal ideation   Past Medical History  Diagnosis Date  . Constipation   . Bronchitis   . Asthma     Past Surgical History  Procedure Date  . Hemrrhoid surgery   . Colonoscopy 2011    for rectal bleeding; [facility]  . Laparoscopic tubal ligation 10/16/2011    Procedure: LAPAROSCOPIC TUBAL LIGATION;  Surgeon: Alwyn Pea, MD;  Location: [facility];  Service: Gynecology;  Laterality: Bilateral;  . Foot surgery     Medications:  HOME MEDS: Prior to Admission medications   Medication Sig Start Date End Date Taking? Authorizing Provider  albuterol (PROVENTIL HFA;VENTOLIN HFA) 108 (90 BASE) MCG/ACT inhaler Inhale 2 puffs into the lungs every 6 (six) hours as needed. For shortness of breath    Yes Historical Provider, MD  HYDROcodone-acetaminophen (NORCO/VICODIN) 5-325 MG per tablet Take 1-2 tablets by mouth every 4 (four) hours as needed. 04/21/12 05/01/12 Yes Delfina Redwood, MD  metroNIDAZOLE (FLAGYL) 500 MG tablet Take 1 tablet (500 mg total) by mouth every 8 (eight) hours. 04/21/12 05/01/12 Yes Delfina Redwood, MD     Allergies:  Allergies  Allergen Reactions  . Oxycodone Anaphylaxis  . Ciprofloxacin Itching and Rash  . Doxycycline Nausea And Vomiting  . Penicillins Rash    Social History:   reports that she has been smoking Cigarettes.  She has a 10 pack-year smoking history. She uses smokeless tobacco. She reports that she does not drink alcohol or use illicit  drugs.  Family History: Family History  Problem Relation Age of Onset  . Hypertension Mother   . Hypertension Father   . Diabetes Mother   . Diabetes Father   . Sarcoidosis Mother     lungs and skin  . Asthma Mother   . Asthma Son   . Asthma Sister    . Asthma Brother   . Asthma Daughter     died age 70.5  . Cancer Daughter 4    brain; died age 70.5  . Asthma Son      Physical Exam: Filed Vitals:   04/26/12 1536 04/26/12 1758 04/26/12 2001  BP: 115/70 107/56 106/69  Pulse: 98 86 76  Temp: 98.3 F (36.8 C)  98.4 F (36.9 C)  TempSrc: Oral  Oral  Resp: 18 17 18   Height: 5' 7"  (1.702 m)  5' 7"  (1.702 m)  Weight: 127.007 kg (280 lb)  127.098 kg (280 lb 3.2 oz)  SpO2: 99% 100% 98%   Blood pressure 106/69, pulse 76, temperature 98.4 F (36.9 C), temperature source Oral, resp. rate 18, height 5' 7"  (1.702 m), weight 127.098 kg (280 lb 3.2 oz), last menstrual period 04/06/2012, SpO2 98.00%.  GEN:  Pleasant obese African American lady lying in the stretcher looking uncomfortable; cooperative with exam PSYCH:  alert and oriented x4; affect is appropriate. HEENT: Mucous membranes pink, dry and anicteric; PERRLA; EOM intact; no cervical lymphadenopathy nor thyromegaly or carotid bruit; no JVD; thick neck Breasts:: Not examined CHEST WALL: No tenderness CHEST: Normal respiration, clear to auscultation bilaterally HEART: Regular rate and rhythm; no murmurs rubs or gallops BACK: No kyphosis or scoliosis; no CVA tenderness ABDOMEN: Obese, distended by fat, diffuse tenderness; no masses, no organomegaly, increased abdominal bowel sounds; large pannus; no intertriginous candida. Rectal Exam: Not done EXTREMITIES: No bone or joint deformity;  no edema; no ulcerations. Genitalia: not examined PULSES: 2+ and symmetric SKIN: Normal hydration no rash or ulceration CNS: Cranial nerves 2-12 grossly intact no focal lateralizing neurologic deficit   Labs on Admission:  Basic Metabolic Panel:  Lab 68/11/57 1652 04/20/12 1140  NA 138 138  K 3.3* 3.6  CL 106 105  CO2 25 24  GLUCOSE 144* 79  BUN 9 12  CREATININE 1.01 1.16*  CALCIUM 9.0 9.4  MG -- --  PHOS -- --   Liver Function Tests:  Lab 04/26/12 1652 04/20/12 1140  AST 11 11  ALT  6 5  ALKPHOS 68 72  BILITOT 0.2* 0.1*  PROT 7.4 7.1  ALBUMIN 3.5 3.4*    Lab 04/20/12 1140  LIPASE 18  AMYLASE --   No results found for this basename: AMMONIA:5 in the last 168 hours CBC:  Lab 04/26/12 1652 04/20/12 1140  WBC 12.2* 10.9*  NEUTROABS 8.4* 6.4  HGB 12.6 11.7*  HCT 39.1 36.2  MCV 86.5 86.2  PLT 331 301   Cardiac Enzymes: No results found for this basename: CKTOTAL:5,CKMB:5,CKMBINDEX:5,TROPONINI:5 in the last 168 hours BNP: No components found with this basename: POCBNP:5 CBG: No results found for this basename: GLUCAP:5 in the last 168 hours  Results for orders placed during the hospital encounter of 04/26/12 (from the past 48 hour(s))  CBC WITH DIFFERENTIAL     Status: Abnormal   Collection Time   04/26/12  4:52 PM      Component Value Range Comment   WBC 12.2 (*) 4.0 - 10.5 K/uL    RBC 4.52  3.87 - 5.11 MIL/uL    Hemoglobin 12.6  12.0 -  15.0 g/dL    HCT 39.1  36.0 - 46.0 %    MCV 86.5  78.0 - 100.0 fL    MCH 27.9  26.0 - 34.0 pg    MCHC 32.2  30.0 - 36.0 g/dL    RDW 15.3  11.5 - 15.5 %    Platelets 331  150 - 400 K/uL    Neutrophils Relative 68  43 - 77 %    Neutro Abs 8.4 (*) 1.7 - 7.7 K/uL    Lymphocytes Relative 26  12 - 46 %    Lymphs Abs 3.2  0.7 - 4.0 K/uL    Monocytes Relative 3  3 - 12 %    Monocytes Absolute 0.4  0.1 - 1.0 K/uL    Eosinophils Relative 2  0 - 5 %    Eosinophils Absolute 0.3  0.0 - 0.7 K/uL    Basophils Relative 0  0 - 1 %    Basophils Absolute 0.0  0.0 - 0.1 K/uL   COMPREHENSIVE METABOLIC PANEL     Status: Abnormal   Collection Time   04/26/12  4:52 PM      Component Value Range Comment   Sodium 138  135 - 145 mEq/L    Potassium 3.3 (*) 3.5 - 5.1 mEq/L    Chloride 106  96 - 112 mEq/L    CO2 25  19 - 32 mEq/L    Glucose, Bld 144 (*) 70 - 99 mg/dL    BUN 9  6 - 23 mg/dL    Creatinine, Ser 1.01  0.50 - 1.10 mg/dL    Calcium 9.0  8.4 - 10.5 mg/dL    Total Protein 7.4  6.0 - 8.3 g/dL    Albumin 3.5  3.5 - 5.2 g/dL     AST 11  0 - 37 U/L    ALT 6  0 - 35 U/L    Alkaline Phosphatase 68  39 - 117 U/L    Total Bilirubin 0.2 (*) 0.3 - 1.2 mg/dL    GFR calc non Af Amer 72 (*) >90 mL/min    GFR calc Af Amer 83 (*) >90 mL/min      Radiological Exams on Admission: No results found.    Assessment/Plan Present on Admission:  .C. difficile colitis .Abdominal pain Hematochezia  Dehydration   .TOBACCO USER .Obesity, unspecified .INTERNAL HEMORRHOIDS .Asthma   PLAN: Bring this lady on observation for hydration and repletion of her potassium; will escalate the management of her C. difficile by adding vancomycin orally; but will also check her C. difficile antigen as it may be that this diarrhea is related to secondary malabsorption due to an ongoing regular diet on abnormal intestinal mucosa;  Will put on a full liquid lactose-free diet, and advance as tolerated.  Patient may possibly be discharged tomorrow if C. difficile is confirmed and arrangements can be made for her to get outpatient vancomycin - she has no health insurance.  Other plans as per orders.  Code Status: FULL CODE.   Fancy Dunkley Nocturnist [facility] Pager [phone]  04/26/2012, 8:32 PM

## 2012-04-26 NOTE — ED Notes (Signed)
edp in with pt

## 2012-04-26 NOTE — ED Notes (Signed)
Pt c/o rectal bleeding, abdominal cramping, nausea and vomiting. Pt states that she was admitted last week and told that she has C diff and Colitis. Pt states that her symptoms have gotten worse.

## 2012-04-27 LAB — BASIC METABOLIC PANEL
BUN: 7 mg/dL (ref 6–23)
CO2: 21 mEq/L (ref 19–32)
Chloride: 110 mEq/L (ref 96–112)
Glucose, Bld: 86 mg/dL (ref 70–99)
Potassium: 4.5 mEq/L (ref 3.5–5.1)
Sodium: 137 mEq/L (ref 135–145)

## 2012-04-27 LAB — CBC
HCT: 36.8 % (ref 36.0–46.0)
Hemoglobin: 12 g/dL (ref 12.0–15.0)
MCH: 28.2 pg (ref 26.0–34.0)
MCHC: 32.6 g/dL (ref 30.0–36.0)
RBC: 4.25 MIL/uL (ref 3.87–5.11)

## 2012-04-27 LAB — CLOSTRIDIUM DIFFICILE BY PCR: Toxigenic C. Difficile by PCR: NEGATIVE

## 2012-04-27 MED ORDER — PROMETHAZINE HCL 12.5 MG PO TABS: 12.5000 mg | ORAL_TABLET | Freq: Four times a day (QID) | ORAL | Status: DC | PRN

## 2012-04-27 MED ORDER — ACETAMINOPHEN 325 MG PO TABS: 650.0000 mg | ORAL_TABLET | ORAL | Status: DC | PRN

## 2012-04-27 MED ORDER — HYDROMORPHONE HCL 2 MG PO TABS: 2.0000 mg | ORAL_TABLET | Freq: Four times a day (QID) | ORAL | Status: AC | PRN

## 2012-04-27 NOTE — Progress Notes (Signed)
[place] [address] [place] [id number]  April 27, 2012  Patient: [name]  Date of Birth: 07-18-1976  Date of Visit: 04/26/2012    To Whom It May Concern:  [name] was seen and treated in our emergency department on 04/26/2012. [name]  may return to work on 05/11/12.  Sincerely,     Doree Barthel, M.D.

## 2012-04-27 NOTE — Progress Notes (Signed)
UR Chart Review Completed  

## 2012-04-27 NOTE — Plan of Care (Signed)
Problem: Discharge Progression Outcomes Goal: Other Discharge Outcomes/Goals Pt was given discharge instructions and new Rx. Pt verbalized understanding of all instructions. Pt was escorted by staff via wheelchair to vehicle. Pt discharged to home in stable condition.

## 2012-04-27 NOTE — Discharge Summary (Signed)
Physician Discharge Summary  Patient ID: [name] MRN: [id number] DOB/AGE: 09-29-1975 36 y.o.  Admit date: 04/26/2012 Discharge date: 04/27/2012  Discharge Diagnoses:  Active Problems:  C. difficile colitis  Obesity, unspecified  TOBACCO USER  INTERNAL HEMORRHOIDS  Abdominal pain  Asthma  Hypokalemia  Dehydration   Medication List  As of 04/27/2012 11:31 AM   STOP taking these medications         HYDROcodone-acetaminophen 5-325 MG per tablet         TAKE these medications         acetaminophen 325 MG tablet   Commonly known as: TYLENOL   Take 2 tablets (650 mg total) by mouth every 4 (four) hours as needed (or Fever >/= 101).      albuterol 108 (90 BASE) MCG/ACT inhaler   Commonly known as: PROVENTIL HFA;VENTOLIN HFA   Inhale 2 puffs into the lungs every 6 (six) hours as needed. For shortness of breath      HYDROmorphone 2 MG tablet   Commonly known as: DILAUDID   Take 1-2 tablets (2-4 mg total) by mouth every 6 (six) hours as needed (severe pain).      metroNIDAZOLE 500 MG tablet   Commonly known as: FLAGYL   Take 1 tablet (500 mg total) by mouth every 8 (eight) hours.      promethazine 12.5 MG tablet   Commonly known as: PHENERGAN   Take 1 tablet (12.5 mg total) by mouth every 6 (six) hours as needed for nausea.            Discharge Orders    Future Orders Please Complete By Expires   Discharge instructions      Comments:   Criss Rosales diet      Disposition: 01-Home or Self Care  Discharged Condition: Stable  Consults:   none  Labs:   Results for orders placed during the hospital encounter of 04/26/12 (from the past 48 hour(s))  CBC WITH DIFFERENTIAL     Status: Abnormal   Collection Time   04/26/12  4:52 PM      Component Value Range Comment   WBC 12.2 (*) 4.0 - 10.5 K/uL    RBC 4.52  3.87 - 5.11 MIL/uL    Hemoglobin 12.6  12.0 - 15.0 g/dL    HCT 39.1  36.0 - 46.0 %    MCV 86.5  78.0 - 100.0 fL    MCH 27.9  26.0 - 34.0 pg    MCHC 32.2   30.0 - 36.0 g/dL    RDW 15.3  11.5 - 15.5 %    Platelets 331  150 - 400 K/uL    Neutrophils Relative 68  43 - 77 %    Neutro Abs 8.4 (*) 1.7 - 7.7 K/uL    Lymphocytes Relative 26  12 - 46 %    Lymphs Abs 3.2  0.7 - 4.0 K/uL    Monocytes Relative 3  3 - 12 %    Monocytes Absolute 0.4  0.1 - 1.0 K/uL    Eosinophils Relative 2  0 - 5 %    Eosinophils Absolute 0.3  0.0 - 0.7 K/uL    Basophils Relative 0  0 - 1 %    Basophils Absolute 0.0  0.0 - 0.1 K/uL   COMPREHENSIVE METABOLIC PANEL     Status: Abnormal   Collection Time   04/26/12  4:52 PM      Component Value Range Comment   Sodium 138  135 -  145 mEq/L    Potassium 3.3 (*) 3.5 - 5.1 mEq/L    Chloride 106  96 - 112 mEq/L    CO2 25  19 - 32 mEq/L    Glucose, Bld 144 (*) 70 - 99 mg/dL    BUN 9  6 - 23 mg/dL    Creatinine, Ser 1.01  0.50 - 1.10 mg/dL    Calcium 9.0  8.4 - 10.5 mg/dL    Total Protein 7.4  6.0 - 8.3 g/dL    Albumin 3.5  3.5 - 5.2 g/dL    AST 11  0 - 37 U/L    ALT 6  0 - 35 U/L    Alkaline Phosphatase 68  39 - 117 U/L    Total Bilirubin 0.2 (*) 0.3 - 1.2 mg/dL    GFR calc non Af Amer 72 (*) >90 mL/min    GFR calc Af Amer 83 (*) >90 mL/min   CLOSTRIDIUM DIFFICILE BY PCR     Status: Normal   Collection Time   04/27/12  2:45 AM      Component Value Range Comment   C difficile by pcr NEGATIVE  NEGATIVE   MAGNESIUM     Status: Normal   Collection Time   04/27/12  4:44 AM      Component Value Range Comment   Magnesium 2.2  1.5 - 2.5 mg/dL   BASIC METABOLIC PANEL     Status: Abnormal   Collection Time   04/27/12  4:44 AM      Component Value Range Comment   Sodium 137  135 - 145 mEq/L    Potassium 4.5  3.5 - 5.1 mEq/L DELTA CHECK NOTED   Chloride 110  96 - 112 mEq/L    CO2 21  19 - 32 mEq/L    Glucose, Bld 86  70 - 99 mg/dL    BUN 7  6 - 23 mg/dL    Creatinine, Ser 0.80  0.50 - 1.10 mg/dL    Calcium 8.1 (*) 8.4 - 10.5 mg/dL    GFR calc non Af Amer >90  >90 mL/min    GFR calc Af Amer >90  >90 mL/min   CBC      Status: Normal   Collection Time   04/27/12  4:44 AM      Component Value Range Comment   WBC 9.3  4.0 - 10.5 K/uL    RBC 4.25  3.87 - 5.11 MIL/uL    Hemoglobin 12.0  12.0 - 15.0 g/dL    HCT 36.8  36.0 - 46.0 %    MCV 86.6  78.0 - 100.0 fL    MCH 28.2  26.0 - 34.0 pg    MCHC 32.6  30.0 - 36.0 g/dL    RDW 15.4  11.5 - 15.5 %    Platelets 283  150 - 400 K/uL     Diagnostics:  Ct Abdomen Pelvis W Contrast  04/20/2012  *RADIOLOGY REPORT*  Clinical Data: Abdominal pain and constipation.  CT ABDOMEN AND PELVIS WITH CONTRAST  Technique:  Multidetector CT imaging of the abdomen and pelvis was performed following the standard protocol during bolus administration of intravenous contrast.  Contrast: 121m OMNIPAQUE IOHEXOL 300 MG/ML  SOLN  Comparison: None.  Findings: The abdominal parenchymal organs are normal in appearance.  Gallbladder is unremarkable.  No evidence of hydronephrosis.  No soft tissue masses are identified within the abdomen or pelvis.  Uterus and adnexa are unremarkable in appearance.  Normal appendix is visualized.  Colonic wall thickening is seen involving the ascending and transverse portions of the colon with sparing of the left colon.  There is no evidence of involving the terminal ileum or small bowel.  No evidence of bowel obstruction. This is consistent with colitis.  There is no evidence of abscess or free fluid. Shotty less than 1 cm mesenteric lymph nodes are seen in the right abdomen, which are likely reactive in etiology.  IMPRESSION:  1.  Moderate colitis involving the ascending and transverse portions of the colon.  Differential diagnosis includes infectious etiologies, or less likely, ulcerative or Crohn's colitis.  Original Report Authenticated By: Marlaine Hind, M.D.   Full Code   Hospital Course: See H&P for complete admission details. The patient is a 36 year old black female who was recently diagnosed with C. difficile colitis. She has been compliant with her Flagyl  but came back to the emergency room do to continued pain, mild hematochezia, nausea. Her potassium was low. She was observed overnight. Given IV fluids, supportive care, potassium repletion. She was admitted overnight last week and demanded a regular diet. I've encouraged her to follow a bland low residue diet. She has not completed her course of Flagyl and was counseled on the usual course of C. difficile colitis. No need to switch antibiotics. She was given Dilaudid to replace her hydrocodone as well as prescription for Phenergan. She may return to work in 2 weeks.  Discharge Exam:  Blood pressure 114/81, pulse 68, temperature 98.6 F (37 C), temperature source Oral, resp. rate 18, height 5' 7"  (1.702 m), weight 127.234 kg (280 lb 8 oz), last menstrual period 04/06/2012, SpO2 100.00%.  General: Comfortable. Talk and on the phone. Lungs clear to auscultation bilaterally without wheezes rhonchi or rales Cardiovascular regular rate rhythm without murmurs gallops rubs Abdomen soft mild left-sided tenderness. Extremities no clubbing cyanosis or edema  Signed: Sharmon Cheramie L 04/27/2012, 11:31 AM

## 2012-06-22 ENCOUNTER — Encounter: Payer: Self-pay | Admitting: Internal Medicine

## 2012-08-23 ENCOUNTER — Emergency Department (HOSPITAL_COMMUNITY): Payer: No Typology Code available for payment source

## 2012-08-23 ENCOUNTER — Emergency Department (HOSPITAL_COMMUNITY)
Admission: EM | Admit: 2012-08-23 | Discharge: 2012-08-23 | Disposition: A | Discharge status: Home / Self Care | Payer: No Typology Code available for payment source | Attending: Emergency Medicine | Admitting: Emergency Medicine

## 2012-08-23 ENCOUNTER — Encounter (HOSPITAL_COMMUNITY): Payer: Self-pay

## 2012-08-23 DIAGNOSIS — Z8719 Personal history of other diseases of the digestive system: Secondary | ICD-10-CM | POA: Insufficient documentation

## 2012-08-23 DIAGNOSIS — Y939 Activity, unspecified: Secondary | ICD-10-CM | POA: Insufficient documentation

## 2012-08-23 DIAGNOSIS — S139XXA Sprain of joints and ligaments of unspecified parts of neck, initial encounter: Secondary | ICD-10-CM | POA: Insufficient documentation

## 2012-08-23 DIAGNOSIS — Z79899 Other long term (current) drug therapy: Secondary | ICD-10-CM | POA: Insufficient documentation

## 2012-08-23 DIAGNOSIS — Y929 Unspecified place or not applicable: Secondary | ICD-10-CM | POA: Insufficient documentation

## 2012-08-23 DIAGNOSIS — R079 Chest pain, unspecified: Secondary | ICD-10-CM | POA: Insufficient documentation

## 2012-08-23 DIAGNOSIS — J45909 Unspecified asthma, uncomplicated: Secondary | ICD-10-CM | POA: Insufficient documentation

## 2012-08-23 DIAGNOSIS — F17200 Nicotine dependence, unspecified, uncomplicated: Secondary | ICD-10-CM | POA: Insufficient documentation

## 2012-08-23 DIAGNOSIS — T148XXA Other injury of unspecified body region, initial encounter: Secondary | ICD-10-CM

## 2012-08-23 DIAGNOSIS — T07XXXA Unspecified multiple injuries, initial encounter: Secondary | ICD-10-CM | POA: Insufficient documentation

## 2012-08-23 DIAGNOSIS — S161XXA Strain of muscle, fascia and tendon at neck level, initial encounter: Secondary | ICD-10-CM

## 2012-08-23 MED ORDER — CYCLOBENZAPRINE HCL 5 MG PO TABS: 5.0000 mg | ORAL_TABLET | Freq: Three times a day (TID) | ORAL | Status: DC | PRN

## 2012-08-23 MED ORDER — TRAMADOL HCL 50 MG PO TABS: 50.0000 mg | ORAL_TABLET | Freq: Four times a day (QID) | ORAL | Status: DC | PRN

## 2012-08-23 MED ORDER — TRAMADOL HCL 50 MG PO TABS
50.0000 mg | ORAL_TABLET | Freq: Once | ORAL | Status: AC
  Administered 2012-08-23: 50 mg via ORAL
  Filled 2012-08-23: qty 1

## 2012-08-23 MED ORDER — IBUPROFEN 800 MG PO TABS
800.0000 mg | ORAL_TABLET | Freq: Once | ORAL | Status: DC
  Filled 2012-08-23: qty 1
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
## 2012-08-23 NOTE — ED Notes (Signed)
Patient returned from X-ray 
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
## 2012-08-23 NOTE — ED Notes (Signed)
Patient transported to X-ray 

## 2013-03-22 ENCOUNTER — Encounter (HOSPITAL_COMMUNITY): Payer: Self-pay | Admitting: Emergency Medicine

## 2013-03-22 ENCOUNTER — Emergency Department (INDEPENDENT_AMBULATORY_CARE_PROVIDER_SITE_OTHER)
Admission: EM | Admit: 2013-03-22 | Discharge: 2013-03-22 | Disposition: A | Discharge status: Home / Self Care | Payer: Managed Care, Other (non HMO) | Source: Home / Self Care | Attending: Family Medicine | Admitting: Family Medicine

## 2013-03-22 DIAGNOSIS — IMO0002 Reserved for concepts with insufficient information to code with codable children: Secondary | ICD-10-CM

## 2013-03-22 DIAGNOSIS — M1710 Unilateral primary osteoarthritis, unspecified knee: Secondary | ICD-10-CM

## 2013-03-22 DIAGNOSIS — M170 Bilateral primary osteoarthritis of knee: Secondary | ICD-10-CM

## 2013-03-22 DIAGNOSIS — M25569 Pain in unspecified knee: Secondary | ICD-10-CM

## 2013-03-22 MED ORDER — PREDNISONE 20 MG PO TABS: ORAL_TABLET | ORAL | Status: DC

## 2013-03-22 MED ORDER — ACETAMINOPHEN 650 MG PO TABS: 1.0000 | ORAL_TABLET | Freq: Four times a day (QID) | ORAL | Status: DC | PRN

## 2013-03-22 MED ORDER — TRAMADOL HCL 50 MG PO TABS: 50.0000 mg | ORAL_TABLET | Freq: Four times a day (QID) | ORAL | Status: DC | PRN

## 2013-03-22 MED ORDER — CAPSAICIN 0.1 % EX CREA: 1.0000 "application " | TOPICAL_CREAM | Freq: Three times a day (TID) | CUTANEOUS | Status: DC | PRN

## 2013-03-22 NOTE — ED Provider Notes (Signed)
History    CSN: [id number] Arrival date & time 03/22/13  5456  First MD Initiated Contact with Patient 03/22/13 1952     Chief Complaint  Patient presents with  . Knee Pain   (Consider location/radiation/quality/duration/timing/severity/associated sxs/prior Treatment) HPI Comments: 37 year old female with history of morbid obesity and chronic bilateral knee pain. Here complaining of bilateral knee pain exacerbation during the last week. Symptoms worse in the last 2 days. Patient is unable to sleep due to pain. There is a lot of grinding and she has not had some swelling in both her knees. Swelling has improved but pain appears to be worsening. No redness or increased temperature. No fever or chills. No recent falls or blunt injuries to her knees. 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    Past Medical History  Diagnosis Date  . Constipation   . Bronchitis   . Asthma    Past Surgical History  Procedure Laterality Date  . Hemrrhoid surgery    . Colonoscopy  2011    for rectal bleeding; [facility]  . Laparoscopic tubal ligation  10/16/2011    Procedure: LAPAROSCOPIC TUBAL LIGATION;  Surgeon: Alwyn Pea, MD;  Location: [facility];  Service: Gynecology;  Laterality: Bilateral;  . Foot surgery     Family History  Problem Relation Age of Onset  . Hypertension Mother   . Hypertension Father   . Diabetes Mother   . Diabetes Father   . Sarcoidosis Mother     lungs and skin  . Asthma Mother   . Asthma Son   . Asthma Sister   . Asthma Brother   . Asthma Daughter     died age 94.5  . Cancer Daughter 4    brain; died age 94.5  . Asthma Son    History  Substance Use Topics  . Smoking status: Current Every Day Smoker -- 0.50 packs/day for 20 years    Types: Cigarettes  . Smokeless tobacco: Current User  . Alcohol Use: No   OB History   Grav Para Term Preterm Abortions TAB SAB Ect Mult Living                 Review of Systems   Constitutional: Negative for fever, chills and appetite change.  HENT: Negative for congestion and sore throat.   Respiratory: Negative for cough.   Musculoskeletal: Positive for arthralgias.  Skin: Negative for rash.  Neurological: Negative for headaches.  All other systems reviewed and are negative.    Allergies  Oxycodone; Ciprofloxacin; Doxycycline; and Penicillins  Home Medications   Current Outpatient Rx  Name  Route  Sig  Dispense  Refill  . Acetaminophen 650 MG TABS   Oral   Take 1 tablet (650 mg total) by mouth 4 (four) times daily as needed.   40 tablet   0   . albuterol (PROVENTIL HFA;VENTOLIN HFA) 108 (90 BASE) MCG/ACT inhaler   Inhalation   Inhale 2 puffs into the lungs every 6 (six) hours as needed. For shortness of breath         . Capsaicin 0.1 % CREA   Apply externally   Apply 1 application topically 3 (three) times daily as needed.   1 Tube   0   . predniSONE (DELTASONE) 20 MG tablet      2 tabs by mouth daily for 5 days.   10 tablet   0   . EXPIRED: promethazine (PHENERGAN) 12.5 MG  tablet   Oral   Take 12.5 mg by mouth every 6 (six) hours as needed. For nausea         . traMADol (ULTRAM) 50 MG tablet   Oral   Take 1 tablet (50 mg total) by mouth every 6 (six) hours as needed for pain.   28 tablet   0    BP 118/72  Pulse 82  Temp(Src) 98.5 F (36.9 C) (Oral)  Resp 18  SpO2 100%  LMP 02/27/2013 Physical Exam  Nursing note and vitals reviewed. Constitutional: She is oriented to person, place, and time. No distress.  Morbidly obese  HENT:  Head: Normocephalic and atraumatic.  Neck: No JVD present.  Cardiovascular: Normal heart sounds.   Pulmonary/Chest: Breath sounds normal.  Musculoskeletal:  Bilateral knees: morbid obese lower extremity look symmetric with no obvious deformity. No erythema increased temp or obvious swelling. No obvious effusion. Bilateral knee grinding with flexion and extension. Well aligned patella. Impress  no hyperlaxity.  Weight bearing in both legs with reported discomfort when walking no obvious limping.   Neurological: She is alert and oriented to person, place, and time.  Skin: No rash noted.    ED Course  Procedures (including critical care time) Labs Reviewed - No data to display No results found. 1. Knee pain, acute, unspecified laterality   2. Osteoarthritis of both knees     MDM  Impress acute on chronic bilateral knee pain likely related to early osteoarthritis due to morbid obeseity. Patient reports intolerance to NSAID's . Prescribed tramadol, prednisone, capsaicin and acetaminophen. Both knees had applications of Ace wrap here. Supportive care including rehabilitation exercises and red flags that should prompt her return to medical attention discussed with patient and provided in writing.  Randa Spike, MD 03/23/13 0730

## 2013-03-22 NOTE — ED Notes (Signed)
Pt c/o bilateral knee pain onset Saturday... Pain radiates down to legs and ankles w/swelling... Pain increases w/movements and walking... 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 Denies: fevers, inj/trauma... She is alert and w/no signs of acute distress; ambulated well to exam room.

## 2013-06-26 ENCOUNTER — Encounter (HOSPITAL_COMMUNITY): Payer: Self-pay

## 2013-06-26 ENCOUNTER — Emergency Department (HOSPITAL_COMMUNITY): Payer: Managed Care, Other (non HMO)

## 2013-06-26 ENCOUNTER — Emergency Department (HOSPITAL_COMMUNITY)
Admission: EM | Admit: 2013-06-26 | Discharge: 2013-06-26 | Disposition: A | Discharge status: Home / Self Care | Payer: Managed Care, Other (non HMO) | Attending: Emergency Medicine | Admitting: Emergency Medicine

## 2013-06-26 DIAGNOSIS — Y9289 Other specified places as the place of occurrence of the external cause: Secondary | ICD-10-CM | POA: Insufficient documentation

## 2013-06-26 DIAGNOSIS — Z79899 Other long term (current) drug therapy: Secondary | ICD-10-CM | POA: Insufficient documentation

## 2013-06-26 DIAGNOSIS — Z8719 Personal history of other diseases of the digestive system: Secondary | ICD-10-CM | POA: Insufficient documentation

## 2013-06-26 DIAGNOSIS — J45909 Unspecified asthma, uncomplicated: Secondary | ICD-10-CM | POA: Insufficient documentation

## 2013-06-26 DIAGNOSIS — Y9301 Activity, walking, marching and hiking: Secondary | ICD-10-CM | POA: Insufficient documentation

## 2013-06-26 DIAGNOSIS — S93409A Sprain of unspecified ligament of unspecified ankle, initial encounter: Secondary | ICD-10-CM | POA: Insufficient documentation

## 2013-06-26 DIAGNOSIS — S63509A Unspecified sprain of unspecified wrist, initial encounter: Secondary | ICD-10-CM | POA: Insufficient documentation

## 2013-06-26 DIAGNOSIS — Z880 Allergy status to penicillin: Secondary | ICD-10-CM | POA: Insufficient documentation

## 2013-06-26 DIAGNOSIS — W108XXA Fall (on) (from) other stairs and steps, initial encounter: Secondary | ICD-10-CM | POA: Insufficient documentation

## 2013-06-26 DIAGNOSIS — F17200 Nicotine dependence, unspecified, uncomplicated: Secondary | ICD-10-CM | POA: Insufficient documentation

## 2013-06-26 MED ORDER — IBUPROFEN 800 MG PO TABS: 800.0000 mg | ORAL_TABLET | Freq: Three times a day (TID) | ORAL | Status: DC

## 2013-06-26 NOTE — Progress Notes (Signed)
Orthopedic Tech Progress Note Patient Details:  [name] 01-02-1976 [id number] Ankle ASO applied to Left LE. Application tolerated well, care instructions provided.  Ortho Devices Type of Ortho Device: ASO Ortho Device/Splint Interventions: Application   Asia R Thompson 06/26/2013, 9:56 AM

## 2013-06-26 NOTE — ED Provider Notes (Signed)
CSN: [id number]     Arrival date & time 06/26/13  0813 History   First MD Initiated Contact with Patient 06/26/13 [phone]     Chief Complaint  Patient presents with  . Fall  . Ankle Pain  . Wrist Pain   (Consider location/radiation/quality/duration/timing/severity/associated sxs/prior Treatment) HPI Comments: Patient presents to the ER for evaluation after a fall. Patient reports that she fell down steps early this morning. Patient reports that she missed a step causing the fall. There was no loss of consciousness. Patient reports that she is hurting "all over, but specifically complains of pain in the right wrist and left ankle. She is having trouble walking because of ankle pain. Pain is moderate, 7/10.  Patient is a 37 y.o. female presenting with fall, ankle pain, and wrist pain.  Fall Pertinent negatives include no headaches.  Ankle Pain Wrist Pain Pertinent negatives include no headaches.    Past Medical History  Diagnosis Date  . Constipation   . Bronchitis   . Asthma    Past Surgical History  Procedure Laterality Date  . Hemrrhoid surgery    . Colonoscopy  2011    for rectal bleeding; [facility]  . Laparoscopic tubal ligation  10/16/2011    Procedure: LAPAROSCOPIC TUBAL LIGATION;  Surgeon: Alwyn Pea, MD;  Location: [facility];  Service: Gynecology;  Laterality: Bilateral;  . Foot surgery     Family History  Problem Relation Age of Onset  . Hypertension Mother   . Hypertension Father   . Diabetes Mother   . Diabetes Father   . Sarcoidosis Mother     lungs and skin  . Asthma Mother   . Asthma Son   . Asthma Sister   . Asthma Brother   . Asthma Daughter     died age 14.5  . Cancer Daughter 4    brain; died age 14.5  . Asthma Son    History  Substance Use Topics  . Smoking status: Current Every Day Smoker -- 0.50 packs/day for 20 years    Types: Cigarettes  . Smokeless tobacco: Never Used  . Alcohol Use: No   OB History   Grav Para Term Preterm Abortions TAB  SAB Ect Mult Living                 Review of Systems  Musculoskeletal: Positive for arthralgias.  Neurological: Negative for dizziness and headaches.    Allergies  Oxycodone; Ciprofloxacin; Doxycycline; and Penicillins  Home Medications   Current Outpatient Rx  Name  Route  Sig  Dispense  Refill  . acetaminophen (TYLENOL) 500 MG tablet   Oral   Take 1,000 mg by mouth every 6 (six) hours as needed for pain.         Marland Kitchen acetaminophen (TYLENOL) 650 MG CR tablet   Oral   Take 1,300 mg by mouth every 8 (eight) hours as needed for pain.         Marland Kitchen albuterol (PROVENTIL HFA;VENTOLIN HFA) 108 (90 BASE) MCG/ACT inhaler   Inhalation   Inhale 2 puffs into the lungs every 6 (six) hours as needed. For shortness of breath         . Capsaicin 0.1 % CREA   Apply externally   Apply 1 application topically 3 (three) times daily as needed.   1 Tube   0    BP 121/83  Pulse 75  Temp(Src) 98.9 F (37.2 C) (Oral)  Resp 16  Ht 5' 6.75" (1.695 m)  Wt  291 lb (131.997 kg)  BMI 45.94 kg/m2  SpO2 98%  LMP 06/25/2013 Physical Exam  Constitutional: She is oriented to person, place, and time. She appears well-developed and well-nourished. No distress.  HENT:  Head: Normocephalic and atraumatic.  Right Ear: Hearing normal.  Left Ear: Hearing normal.  Nose: Nose normal.  Mouth/Throat: Oropharynx is clear and moist and mucous membranes are normal.  Eyes: Conjunctivae and EOM are normal. Pupils are equal, round, and reactive to light.  Neck: Normal range of motion. Neck supple.  Cardiovascular: Regular rhythm, S1 normal and S2 normal.  Exam reveals no gallop and no friction rub.   No murmur heard. Pulmonary/Chest: Effort normal and breath sounds normal. No respiratory distress. She exhibits no tenderness.  Abdominal: Soft. Normal appearance and bowel sounds are normal. There is no hepatosplenomegaly. There is no tenderness. There is no rebound, no guarding, no tenderness at McBurney's point  and negative Murphy's sign. No hernia.  Musculoskeletal: Normal range of motion.       Left shoulder: She exhibits tenderness. She exhibits normal range of motion.       Right wrist: She exhibits tenderness and swelling. She exhibits normal range of motion.       Left ankle: She exhibits no swelling, no ecchymosis and no deformity. Tenderness.       Cervical back: She exhibits no bony tenderness.       Thoracic back: She exhibits normal range of motion and no tenderness.       Lumbar back: She exhibits normal range of motion and no tenderness.       Back:       Arms: Neurological: She is alert and oriented to person, place, and time. She has normal strength. No cranial nerve deficit or sensory deficit. Coordination normal. GCS eye subscore is 4. GCS verbal subscore is 5. GCS motor subscore is 6.  Skin: Skin is warm, dry and intact. No rash noted. No cyanosis.  Psychiatric: She has a normal mood and affect. Her speech is normal and behavior is normal. Thought content normal.    ED Course  Procedures (including critical care time) Labs Review Labs Reviewed - No data to display Imaging Review Dg Wrist Complete Right  06/26/2013   CLINICAL DATA:  [place] down stairs last night, right wrist pain, initial encounter  EXAM: RIGHT WRIST - COMPLETE 3+ VIEW  COMPARISON:  10/12/2004  FINDINGS: Small cystic lucency within lunate new since previous exam.  Osseous mineralization normal.  Joint spaces preserved.  No acute fracture, dislocation or bone destruction. Additional small cystic lucency at the ulnar styloid.  IMPRESSION: No acute osseous abnormalities.  New cystic lucencies at the ulnar styloid and lunate which could be related to degenerative change, prior trauma, or an inflammatory arthropathy.   Electronically Signed   By: Lavonia Dana M.D.   On: 06/26/2013 09:09   Dg Ankle Complete Left  06/26/2013   CLINICAL DATA:  [place].  Anterior pain.  EXAM: LEFT ANKLE COMPLETE - 3+ VIEW  COMPARISON:  None.   FINDINGS: There is soft tissue swelling. No fracture. No degenerative change or other focal lesion.  IMPRESSION: Soft tissue swelling but no evidence of fracture or dislocation.   Electronically Signed   By: Nelson Chimes M.D.   On: 06/26/2013 09:07    MDM  Diagnosis: 1. Ankle sprain 2. Wrist sprain  Patient presents to the ER after a fall. Patient the family of left ankle and right wrist pain. X-rays of these areas were  obtained and no fracture is seen. Patient also complaining of soft tissue pain around the left shoulder and upper back area. No midline tenderness in the cervical spine or thoracic spine. No concern for cervical or thoracic spine injury based on exam. Left shoulder range of motion is normal, no concern for fracture or dislocation. Patient will be treated with rest, ice, analgesia.    Orpah Greek, MD 06/26/13 [phone]

## 2013-06-26 NOTE — ED Notes (Signed)
Pt alert and oriented x4. Respirations even and unlabored, bilateral symmetrical rise and fall of chest. Skin warm and dry. In no acute distress. Denies needs.   

## 2013-06-26 NOTE — ED Notes (Signed)
Pt c/o L ankle pain and bilateral wrist pain, after falling down steps this morning.  Pain score 7/10.  Swelling noted to all areas.  Sts "I missed a step and fell."  Denies LOC.

## 2013-09-23 ENCOUNTER — Encounter (HOSPITAL_COMMUNITY): Payer: Self-pay | Admitting: Emergency Medicine

## 2013-09-23 ENCOUNTER — Emergency Department (HOSPITAL_COMMUNITY)
Admission: EM | Admit: 2013-09-23 | Discharge: 2013-09-23 | Disposition: A | Discharge status: Home / Self Care | Payer: Managed Care, Other (non HMO) | Attending: Emergency Medicine | Admitting: Emergency Medicine

## 2013-09-23 DIAGNOSIS — R0789 Other chest pain: Secondary | ICD-10-CM | POA: Insufficient documentation

## 2013-09-23 DIAGNOSIS — Z79899 Other long term (current) drug therapy: Secondary | ICD-10-CM | POA: Insufficient documentation

## 2013-09-23 DIAGNOSIS — F17200 Nicotine dependence, unspecified, uncomplicated: Secondary | ICD-10-CM | POA: Insufficient documentation

## 2013-09-23 DIAGNOSIS — J3489 Other specified disorders of nose and nasal sinuses: Secondary | ICD-10-CM | POA: Insufficient documentation

## 2013-09-23 DIAGNOSIS — R05 Cough: Secondary | ICD-10-CM | POA: Insufficient documentation

## 2013-09-23 DIAGNOSIS — R059 Cough, unspecified: Secondary | ICD-10-CM | POA: Insufficient documentation

## 2013-09-23 DIAGNOSIS — Z880 Allergy status to penicillin: Secondary | ICD-10-CM | POA: Insufficient documentation

## 2013-09-23 MED ORDER — BENZONATATE 100 MG PO CAPS: 100.0000 mg | ORAL_CAPSULE | Freq: Three times a day (TID) | ORAL | Status: DC

## 2013-09-23 NOTE — ED Notes (Signed)
Pt c/o cough since yesterday.

## 2013-09-23 NOTE — ED Provider Notes (Signed)
CSN: [id number]     Arrival date & time 09/23/13  1131 History  This chart was scribed for non-physician practitioner, Alecia Lemming, PA-C working with Hoy Morn, MD by Frederich Balding, ED scribe. This patient was seen in room [facility] and the patient's care was started at 1:08 PM.   Chief Complaint  Patient presents with  . Cough   The history is provided by the patient. No language interpreter was used.   HPI Comments: [name] is a 37 y.o. female who presents to the Emergency Department complaining of cough that started about 2 weeks ago. States the cough worsened over the last 2 days and she now has some chest tightness and rhinorrhea. Laying down worsens her symptoms. Pt has been given an inhaler in the past for bronchitis and has used it with no relief. She has also used OTC medications with no relief. She states her urine has been cloudier recently and is unsure if that is associated with her current symptoms. Denies fever, chills, leg swelling, dysruia. Denies history of recent surgery. Denies history of blood clots.   Past Medical History  Diagnosis Date  . Constipation   . Bronchitis   . Asthma    Past Surgical History  Procedure Laterality Date  . Hemrrhoid surgery    . Colonoscopy  2011    for rectal bleeding; [facility]  . Laparoscopic tubal ligation  10/16/2011    Procedure: LAPAROSCOPIC TUBAL LIGATION;  Surgeon: Alwyn Pea, MD;  Location: [facility];  Service: Gynecology;  Laterality: Bilateral;  . Foot surgery     Family History  Problem Relation Age of Onset  . Hypertension Mother   . Hypertension Father   . Diabetes Mother   . Diabetes Father   . Sarcoidosis Mother     lungs and skin  . Asthma Mother   . Asthma Son   . Asthma Sister   . Asthma Brother   . Asthma Daughter     died age 37.5  . Cancer Daughter 4    brain; died age 37.5  . Asthma Son    History  Substance Use Topics  . Smoking status: Current Every Day Smoker -- 0.50 packs/day for  20 years    Types: Cigarettes  . Smokeless tobacco: Never Used  . Alcohol Use: No   OB History   Grav Para Term Preterm Abortions TAB SAB Ect Mult Living                 Review of Systems  Constitutional: Negative for fever, chills and fatigue.  HENT: Positive for rhinorrhea. Negative for congestion, ear pain, sinus pressure and sore throat.   Eyes: Negative for redness.  Respiratory: Positive for cough and chest tightness. Negative for wheezing.   Cardiovascular: Negative for leg swelling.  Gastrointestinal: Negative for nausea, vomiting, abdominal pain and diarrhea.  Genitourinary: Negative for dysuria.  Musculoskeletal: Negative for myalgias and neck stiffness.  Skin: Negative for rash.  Neurological: Negative for headaches.  Hematological: Negative for adenopathy.    Allergies  Oxycodone; Ciprofloxacin; Doxycycline; and Penicillins  Home Medications   Current Outpatient Rx  Name  Route  Sig  Dispense  Refill  . acetaminophen (TYLENOL) 500 MG tablet   Oral   Take 1,000 mg by mouth every 6 (six) hours as needed for mild pain or moderate pain.          Marland Kitchen acetaminophen (TYLENOL) 650 MG CR tablet   Oral   Take 650  mg by mouth every 8 (eight) hours as needed for pain.         Marland Kitchen albuterol (PROVENTIL HFA;VENTOLIN HFA) 108 (90 BASE) MCG/ACT inhaler   Inhalation   Inhale 2 puffs into the lungs every 6 (six) hours as needed for wheezing or shortness of breath. For shortness of breath         . ibuprofen (ADVIL,MOTRIN) 200 MG tablet   Oral   Take 600 mg by mouth every 6 (six) hours as needed for mild pain or moderate pain.          BP 124/90  Pulse 84  Temp(Src) 98.6 F (37 C) (Oral)  Resp 16  SpO2 100%  Physical Exam  Nursing note and vitals reviewed. Constitutional: She appears well-developed and well-nourished. No distress.  HENT:  Head: Normocephalic and atraumatic.  Right Ear: Tympanic membrane, external ear and ear canal normal.  Left Ear: Tympanic  membrane, external ear and ear canal normal.  Nose: Rhinorrhea present. No mucosal edema.  Mouth/Throat: Uvula is midline, oropharynx is clear and moist and mucous membranes are normal. Mucous membranes are not dry. No oral lesions. No trismus in the jaw. No uvula swelling. No oropharyngeal exudate, posterior oropharyngeal edema, posterior oropharyngeal erythema or tonsillar abscesses.  Eyes: Conjunctivae and EOM are normal. Right eye exhibits no discharge. Left eye exhibits no discharge.  Neck: Normal range of motion. Neck supple. No tracheal deviation present.  Cardiovascular: Normal rate, regular rhythm and normal heart sounds.   Pulses:      Radial pulses are 2+ on the right side.  Pulmonary/Chest: Effort normal and breath sounds normal. No respiratory distress. She has no wheezes. She has no rales.  Talking in full sentences, no tachypnea.  Abdominal: Soft. There is no tenderness.  Musculoskeletal: Normal range of motion.  Lymphadenopathy:    She has no cervical adenopathy.  Neurological: She is alert.  Skin: Skin is warm and dry.  Psychiatric: She has a normal mood and affect. Her behavior is normal.    ED Course  Procedures (including critical care time)  DIAGNOSTIC STUDIES: Oxygen Saturation is 100% on RA, normal by my interpretation.    COORDINATION OF CARE: 1:13 PM-Discussed treatment plan which includes continuing use of inhaler, Tussionex and tessalon with pt at bedside and pt agreed to plan. States there is no need for blood work or chest xray and pt agrees.   Labs Review Labs Reviewed - No data to display Imaging Review No results found.  EKG Interpretation   None      Vital signs reviewed and are as follows: Filed Vitals:   09/23/13 1208  BP: 124/90  Pulse: 84  Temp: 98.6 F (37 C)  Resp: 16    Pt counseled to continue albuterol prn. Will prescribe tessalon for cough.   Patient counseled on supportive care for bronchitis and s/s to return including  worsening symptoms, persistent fever, persistent vomiting, or if they have any other concerns.  Urged to see PCP if symptoms persist for more than 3 days. Patient verbalizes understanding and agrees with plan.    MDM   1. Cough    Patient with likely viral bronchitis/cough. No concern for PNA given normal lung exam. Antibiotics not indicated. Conservative therapy indicated. No comorbid conditions that would place patient at high risk for worsening.   I personally performed the services described in this documentation, which was scribed in my presence. The recorded information has been reviewed and is accurate.   Carlisle Cater,  PA-C 09/23/13 1338

## 2013-09-23 NOTE — ED Provider Notes (Signed)
Medical screening examination/treatment/procedure(s) were performed by non-physician practitioner and as supervising physician I was immediately available for consultation/collaboration.  EKG Interpretation   None         Hoy Morn, MD 09/23/13 1346

## 2014-05-26 ENCOUNTER — Ambulatory Visit (INDEPENDENT_AMBULATORY_CARE_PROVIDER_SITE_OTHER): Payer: 59 | Admitting: Family Medicine

## 2014-05-26 VITALS — BP 134/82 | HR 97 | Temp 99.1°F | Resp 18 | Ht 67.5 in | Wt 326.0 lb

## 2014-05-26 DIAGNOSIS — M5430 Sciatica, unspecified side: Secondary | ICD-10-CM

## 2014-05-26 DIAGNOSIS — K529 Noninfective gastroenteritis and colitis, unspecified: Secondary | ICD-10-CM

## 2014-05-26 DIAGNOSIS — M79609 Pain in unspecified limb: Secondary | ICD-10-CM

## 2014-05-26 DIAGNOSIS — M5432 Sciatica, left side: Secondary | ICD-10-CM

## 2014-05-26 DIAGNOSIS — H6503 Acute serous otitis media, bilateral: Secondary | ICD-10-CM

## 2014-05-26 DIAGNOSIS — K5289 Other specified noninfective gastroenteritis and colitis: Secondary | ICD-10-CM

## 2014-05-26 DIAGNOSIS — H6500 Acute serous otitis media, unspecified ear: Secondary | ICD-10-CM

## 2014-05-26 DIAGNOSIS — M79672 Pain in left foot: Secondary | ICD-10-CM

## 2014-05-26 DIAGNOSIS — D509 Iron deficiency anemia, unspecified: Secondary | ICD-10-CM

## 2014-05-26 LAB — POCT CBC
Granulocyte percent: 61 %G (ref 37–80)
HCT, POC: 27.1 % — AB (ref 37.7–47.9)
Hemoglobin: 8.3 g/dL — AB (ref 12.2–16.2)
Lymph, poc: 3.5 — AB (ref 0.6–3.4)
MCH, POC: 23.1 pg — AB (ref 27–31.2)
MCHC: 30.8 g/dL — AB (ref 31.8–35.4)
MCV: 75.1 fL — AB (ref 80–97)
MID (cbc): 0.7 (ref 0–0.9)
MPV: 8.2 fL (ref 0–99.8)
POC Granulocyte: 6.6 (ref 2–6.9)
POC LYMPH PERCENT: 32.2 %L (ref 10–50)
POC MID %: 6.8 %M (ref 0–12)
Platelet Count, POC: 415 10*3/uL (ref 142–424)
RBC: 3.6 M/uL — AB (ref 4.04–5.48)
RDW, POC: 17.3 %
WBC: 10.8 10*3/uL — AB (ref 4.6–10.2)

## 2014-05-26 LAB — TSH: TSH: 2.417 u[IU]/mL (ref 0.350–4.500)

## 2014-05-26 LAB — POCT URINALYSIS DIPSTICK
Bilirubin, UA: NEGATIVE
Blood, UA: NEGATIVE
Glucose, UA: NEGATIVE
Ketones, UA: NEGATIVE
Leukocytes, UA: NEGATIVE
Nitrite, UA: NEGATIVE
Protein, UA: NEGATIVE
Spec Grav, UA: 1.015
Urobilinogen, UA: 0.2
pH, UA: 6.5

## 2014-05-26 LAB — FERRITIN: Ferritin: 4 ng/mL — ABNORMAL LOW (ref 10–291)

## 2014-05-26 MED ORDER — PREDNISONE 20 MG PO TABS: 40.0000 mg | ORAL_TABLET | Freq: Every day | ORAL | Status: DC

## 2014-05-26 NOTE — Progress Notes (Addendum)
Subjective:    Patient ID: [name], female    DOB: 24-Feb-1976, 38 y.o.   MRN: [id number]  HPI Chief Complaint  Patient presents with   Leg Pain   Foot Pain   Sore Throat   popping in ear   Weight Gain   Blood In Stools   This chart was scribed for Robyn Haber , MD by Thea Alken, ED Scribe. This patient was seen in room 14 and the patient's care was started at 2:06 PM.  HPI Comments: [name] is a 38 y.o. female who presents to the [facility] complaining of left leg pain. She reports pain to entire left leg from left hip to ankle. Pt believes the origin of the pain is a pinched nerve in back. She reports she had pain with walking. Pt has trouble getting comfortable during the night due to pain and constantly needs assistance from her son. Pt reports she frequent falls. She reports her leg and foot is constantly "falling asleep" causing her to fall when bearing weight. Pt reports foot injury 1 year ago.  Pt reports shs has gained 13 lbs in the last 2 weeks. Pt does not have PCP.   Patient has been having bloody stools and is due for followup with her gastroenterologist for chronic colitis. She denies any cramps. She still has regular periods but they're much smaller since she underwent the NovaSure.  Pt works at [employer] and is constant on her feet.  Past Medical History  Diagnosis Date   Constipation    Bronchitis    Asthma    Past Surgical History  Procedure Laterality Date   Hemrrhoid surgery     Colonoscopy  2011    for rectal bleeding; [facility]   Laparoscopic tubal ligation  10/16/2011    Procedure: LAPAROSCOPIC TUBAL LIGATION;  Surgeon: Alwyn Pea, MD;  Location: [facility];  Service: Gynecology;  Laterality: Bilateral;   Foot surgery     Prior to Admission medications   Medication Sig Start Date End Date Taking? Authorizing Provider  ibuprofen (ADVIL,MOTRIN) 200 MG tablet Take 600 mg by mouth every 6  (six) hours as needed for mild pain or moderate pain.   Yes Historical Provider, MD  acetaminophen (TYLENOL) 500 MG tablet Take 1,000 mg by mouth every 6 (six) hours as needed for mild pain or moderate pain.     Historical Provider, MD  acetaminophen (TYLENOL) 650 MG CR tablet Take 650 mg by mouth every 8 (eight) hours as needed for pain.    Historical Provider, MD  albuterol (PROVENTIL HFA;VENTOLIN HFA) 108 (90 BASE) MCG/ACT inhaler Inhale 2 puffs into the lungs every 6 (six) hours as needed for wheezing or shortness of breath. For shortness of breath    Historical Provider, MD  benzonatate (TESSALON) 100 MG capsule Take 1 capsule (100 mg total) by mouth every 8 (eight) hours. 09/23/13   Carlisle Cater, PA-C   Review of Systems  Musculoskeletal: Positive for back pain and myalgias. Negative for gait problem.   Objective:   Physical Exam  Nursing note and vitals reviewed. Constitutional: She is oriented to person, place, and time. She appears well-developed and well-nourished. No distress.  HENT:  Head: Normocephalic and atraumatic.  Eyes: Conjunctivae and EOM are normal.  Neck: Neck supple.  Cardiovascular: Normal rate.   Pulmonary/Chest: Effort normal.  Musculoskeletal: Normal range of motion.  Neurological: She is alert and oriented to person, place, and time.  Skin: Skin is warm and dry.  Psychiatric: She has a normal mood and affect. Her behavior is normal.    she is obese. She walks slowly and carefully with a waddling type gait.  Inspection of the lower extremities shows no bony abnormality. There is no calf tenderness or edema. She seemed to have good range of motion of the hip and knee.  Reflexes in the knees and ankles are normal. Straight leg raising when patient is seated is normal as she has 90 hip flexion bilaterally she sits on the exam table without pain. Results for orders placed in visit on 05/26/14  POCT CBC      Result Value Ref Range   WBC 10.8 (*) 4.6 - 10.2 K/uL     Lymph, poc 3.5 (*) 0.6 - 3.4   POC LYMPH PERCENT 32.2  10 - 50 %L   MID (cbc) 0.7  0 - 0.9   POC MID % 6.8  0 - 12 %M   POC Granulocyte 6.6  2 - 6.9   Granulocyte percent 61.0  37 - 80 %G   RBC 3.60 (*) 4.04 - 5.48 M/uL   Hemoglobin 8.3 (*) 12.2 - 16.2 g/dL   HCT, POC 27.1 (*) 37.7 - 47.9 %   MCV 75.1 (*) 80 - 97 fL   MCH, POC 23.1 (*) 27 - 31.2 pg   MCHC 30.8 (*) 31.8 - 35.4 g/dL   RDW, POC 17.3     Platelet Count, POC 415  142 - 424 K/uL   MPV 8.2  0 - 99.8 fL     Assessment & Plan:    Sciatica, left - Plan: POCT CBC, POCT urinalysis dipstick, TSH, predniSONE (DELTASONE) 20 MG tablet  Left foot pain - Plan: POCT CBC, POCT urinalysis dipstick, TSH, predniSONE (DELTASONE) 20 MG tablet  Iron deficiency anemia - Plan: Ferritin  Colitis - Plan: Ambulatory referral to Gastroenterology  Acute serous otitis media of both ears without rupture - Plan: predniSONE (DELTASONE) 20 MG tablet  I attest patient come back in a couple days psyche recheck her pain, review her lab results, make sure she is common with her gastroenterologist. Signed, Robyn Haber, MD

## 2014-05-26 NOTE — Patient Instructions (Addendum)
Our x-ray machine is down today. I'm going to give you medicine should help with the pain.  The blood test today shows that you're significantly anemic. This will need further evaluation.  I would like you to come back in 3 days to see how your back is doing as well as review all of your lab tests.  I'm working 9 to 5 Monday, 9 to 4 on Wednesday

## 2014-05-26 NOTE — Addendum Note (Signed)
Addended by: Constance Goltz on: 05/26/2014 04:10 PM   Modules accepted: Orders

## 2014-05-29 ENCOUNTER — Telehealth: Payer: Self-pay | Admitting: Internal Medicine

## 2014-05-29 NOTE — Telephone Encounter (Signed)
Left message for patient to call back  

## 2014-05-29 NOTE — Telephone Encounter (Signed)
Patient will come in tomorrow and see Dr. Carlean Purl at 10:15.  She is aware of the appt date and time

## 2014-05-30 ENCOUNTER — Encounter: Payer: Self-pay | Admitting: Internal Medicine

## 2014-05-30 ENCOUNTER — Ambulatory Visit (INDEPENDENT_AMBULATORY_CARE_PROVIDER_SITE_OTHER): Payer: 59 | Admitting: Internal Medicine

## 2014-05-30 VITALS — BP 120/78 | HR 68 | Ht 67.0 in | Wt 322.0 lb

## 2014-05-30 DIAGNOSIS — K589 Irritable bowel syndrome without diarrhea: Secondary | ICD-10-CM

## 2014-05-30 DIAGNOSIS — D500 Iron deficiency anemia secondary to blood loss (chronic): Secondary | ICD-10-CM

## 2014-05-30 DIAGNOSIS — K648 Other hemorrhoids: Secondary | ICD-10-CM

## 2014-05-30 MED ORDER — FERROUS FUMARATE-FOLIC ACID 324-1 MG PO TABS: 1.0000 | ORAL_TABLET | Freq: Every day | ORAL | Status: DC

## 2014-05-30 NOTE — Patient Instructions (Addendum)
You have been scheduled for a colonoscopy. Please follow written instructions given to you at your visit today.  Please pick up your prep kit at the pharmacy within the next 1-3 days.  Use your free coupon. If you use inhalers (even only as needed), please bring them with you on the day of your procedure. Your physician has requested that you go to www.startemmi.com and enter the access code given to you at your visit today. This web site gives a general overview about your procedure. However, you should still follow specific instructions given to you by our office regarding your preparation for the procedure.  We have sent the following medications to your pharmacy for you to pick up at your convenience: Prescription iron  Today you have been given a handout to read and follow on benefiber.  I appreciate the opportunity to care for you.

## 2014-05-30 NOTE — Progress Notes (Signed)
   Subjective:    Patient ID: [name], female    DOB: 1976/07/29, 38 y.o.   MRN: [id number]  HPI The patient is here with daily rectal bleeding, painless, after defecation, with clots at times. She is anemic and iron-defcient. I know her from 2007 evaluation - had internal hemorrhoid problems and bleeding despite  Surgery for these in 2006. Apparently improved but in past 2 years has developed recurrent daily bleeding. Seen at [facility] this week - anemia and iron-deficiency. Does not tolerate OTC iron. Has had menorrhea but since interventions that stopped.  Has wild swings from numerous loose stools to hard stool and days w/o defecation  Medications, allergies, past medical history, past surgical history, family history and social history are reviewed and updated in the EMR.  Review of Systems Menses slight    Objective:   Physical Exam General:  Well-developed, well-nourished and in no acute distress - obese Eyes:  anicteric. ENT:   Mouth and posterior pharynx free of lesions.  Neck:   supple w/o thyromegaly or mass.  Lungs: Clear to auscultation bilaterally. Heart:  S1S2, no rubs, murmurs, gallops. Abdomen:  soft, non-tender, no hepatosplenomegaly, hernia, or mass and BS+. - obese Rectal:  [name], [place] present.  Anoderm inspection revealed normal findings Anal wink was present Digital exam revealed normal resting tone and voluntary squeeze. No mass or rectocele present. Simulated defecation with valsalva revealed appropriate abdominal contraction and descent.     Anoscopy was performed with the patient in the left lateral decubitus position while a chaperone was present and revealed Grade 2 internal hemorrhoids all positions    Lymph:  no cervical or supraclavicular adenopathy. Extremities:   no edema Skin   no rash. Neuro:  A&O x 3.  Psych:  appropriate mood and  Affect.   Data Reviewed: Lab Results  Component Value Date   WBC 10.8* 05/26/2014   HGB  8.3* 05/26/2014   HCT 27.1* 05/26/2014   MCV 75.1* 05/26/2014   PLT 283 04/27/2012   Lab Results  Component Value Date   FERRITIN 4* 05/26/2014       Assessment & Plan:  Iron deficiency anemia secondary to blood loss (chronic) - Plan: Ferrous Fumarate-Folic Acid 739-5 MG TABS, 0.9 %  sodium chloride infusion, 0.9 %  sodium chloride infusion, Ambulatory referral to Gastroenterology  Internal hemorrhoids with other complication  IBS (irritable bowel syndrome)  1) Hemocyte 2) Colonoscopy to be more certain bleeding os from hemorrhoid. The risks and benefits as well as alternatives of endoscopic procedure(s) have been discussed and reviewed. All questions answered. The patient agrees to proceed. 3) Benefiber 2 tbsp daily  KG:[id number],DODQ, MD

## 2014-05-31 ENCOUNTER — Telehealth: Payer: Self-pay

## 2014-05-31 NOTE — Telephone Encounter (Signed)
Labs faxed thru Epic.

## 2014-05-31 NOTE — Telephone Encounter (Signed)
Patient would like her lab results faxed to her at work. Fax number [phone].

## 2014-06-01 ENCOUNTER — Encounter: Payer: Self-pay | Admitting: Internal Medicine

## 2014-06-01 ENCOUNTER — Ambulatory Visit (INDEPENDENT_AMBULATORY_CARE_PROVIDER_SITE_OTHER): Payer: 59 | Admitting: Family Medicine

## 2014-06-01 ENCOUNTER — Ambulatory Visit (INDEPENDENT_AMBULATORY_CARE_PROVIDER_SITE_OTHER): Payer: 59

## 2014-06-01 VITALS — BP 128/78 | HR 95 | Temp 97.3°F | Resp 16 | Ht 67.5 in | Wt 328.0 lb

## 2014-06-01 DIAGNOSIS — M5440 Lumbago with sciatica, unspecified side: Secondary | ICD-10-CM

## 2014-06-01 DIAGNOSIS — M5430 Sciatica, unspecified side: Secondary | ICD-10-CM

## 2014-06-01 DIAGNOSIS — D500 Iron deficiency anemia secondary to blood loss (chronic): Secondary | ICD-10-CM

## 2014-06-01 MED ORDER — TRAMADOL HCL 50 MG PO TABS: 50.0000 mg | ORAL_TABLET | Freq: Three times a day (TID) | ORAL | Status: DC | PRN

## 2014-06-01 NOTE — Progress Notes (Signed)
This is a 38 y.o.female who complains of back pain and bilateral leg pains.  Character of pain: aching Location of pain:  Left greater than right thigh Radiation of pain:  legs Onset associated with:  nothing Patient has not a past history of low back pain.  Patient is working at [employer] and [employer] denies any urinary symptoms, bowel problems, numbness in the legs, loss of motor power. [name] had no fever.  SChristina DERYN Lutz has tried prednisone..  Past Medical History  Diagnosis Date  . Bronchitis   . Asthma   . Clostridium difficile infection 04/20/2012  . Obesity   . Osteoarthritis     Both knees     Past Surgical History  Procedure Laterality Date  . Hemrrhoid surgery    . Colonoscopy  2007    for rectal bleeding; [facility]  . Laparoscopic tubal ligation  10/16/2011    Procedure: LAPAROSCOPIC TUBAL LIGATION;  Surgeon: Alwyn Pea, MD;  Location: [facility];  Service: Gynecology;  Laterality: Bilateral;  . Foot surgery      ROS:  Positive for intermittent headaches, nausea, fatigue, lightheadedness and dizziness when moving quickly, shortness of breath at times, tremulousness, loss of balance with falls.  Rectal bleeding continues (colonoscopy is scheduled)  Objective:  middle-aged female in no acute distress. Blood pressure 128/78, pulse 95, temperature 97.3 F (36.3 C), temperature source Oral, resp. rate 16, height 5' 7.5" (1.715 m), weight 328 lb (148.78 kg), SpO2 98.00%.Body mass index is 50.58 kg/(m^2). Palpation of the back reveals no tenderness CVA:  nontender Abdomen: obese Peripheral pulses:  DP/PT full Inspection of the back: Reveals no scoliosis Straight-leg raising: without pain Motor exam of lower extremity: No abnormal weakness. Reflexes: Symmetric and normal Skin exam: normal Results for orders placed in visit on 05/26/14  TSH      Result Value Ref Range   TSH 2.417  0.350 - 4.500 uIU/mL  FERRITIN      Result Value Ref  Range   Ferritin 4 (*) 10 - 291 ng/mL  POCT CBC      Result Value Ref Range   WBC 10.8 (*) 4.6 - 10.2 K/uL   Lymph, poc 3.5 (*) 0.6 - 3.4   POC LYMPH PERCENT 32.2  10 - 50 %L   MID (cbc) 0.7  0 - 0.9   POC MID % 6.8  0 - 12 %M   POC Granulocyte 6.6  2 - 6.9   Granulocyte percent 61.0  37 - 80 %G   RBC 3.60 (*) 4.04 - 5.48 M/uL   Hemoglobin 8.3 (*) 12.2 - 16.2 g/dL   HCT, POC 27.1 (*) 37.7 - 47.9 %   MCV 75.1 (*) 80 - 97 fL   MCH, POC 23.1 (*) 27 - 31.2 pg   MCHC 30.8 (*) 31.8 - 35.4 g/dL   RDW, POC 17.3     Platelet Count, POC 415  142 - 424 K/uL   MPV 8.2  0 - 99.8 fL  POCT URINALYSIS DIPSTICK      Result Value Ref Range   Color, UA yellow     Clarity, UA clear     Glucose, UA neg     Bilirubin, UA neg     Ketones, UA neg     Spec Grav, UA 1.015     Blood, UA neg     pH, UA 6.5     Protein, UA neg     Urobilinogen, UA  0.2     Nitrite, UA neg     Leukocytes, UA Negative     [facility] reading (PRIMARY) by  Dr. Joseph Art:  Lumbar spines:  No scoliosis, significant disc narrowing or bony changes..   Assessment/Plan: Acute lower back pain without acute neurological findings.  She is very anemic with low iron and chronic GI bleeding. Has h/o tubal ligation.  She has had novasure.  I believe many of her symptoms are attributable to the severe anemia.  I want her to be transfused before I run down the many different symptoms she is experiencing.  I will refer to short stay either Tuesday or Wednesday morning for transfusion of 2 units of PRBC's.  Follow up next week  Robyn Haber, MD

## 2014-06-04 ENCOUNTER — Telehealth: Payer: Self-pay | Admitting: Family Medicine

## 2014-06-04 ENCOUNTER — Other Ambulatory Visit: Payer: Self-pay | Admitting: Family Medicine

## 2014-06-04 DIAGNOSIS — D500 Iron deficiency anemia secondary to blood loss (chronic): Secondary | ICD-10-CM

## 2014-06-04 NOTE — Telephone Encounter (Signed)
Called patient to let her know we would be scheduling transfusion tomorrow or Wednesday AM.   She said you wanted her to let you know how Ultram was doing. She said she has taken it like 3 times and she has been having dizziness, different then usual feels more "woozy", I told her her she could half the tablet and see if she still feels that way. Do you want to change to something else? Please advise  Also Saturday and yesterday she has had brief, occasional, sharp "ice pick' pain R side chest/middle chest that lasts like 10 seconds and goes away. When she raises her R arm she can feel where the area of pain is. It happens randomly and while she is just laying down.  She wanted to let you know the bleeding has decreased.

## 2014-06-04 NOTE — Progress Notes (Signed)
Per Dr. Joseph Art needs urgent referral  to short stay either Tuesday or Wednesday morning for transfusion of 2 units of PRBC's

## 2014-06-04 NOTE — Telephone Encounter (Signed)
I think your advice is sound.  We will try to set up transfusion tomorrow.

## 2014-06-05 ENCOUNTER — Telehealth: Payer: Self-pay | Admitting: *Deleted

## 2014-06-05 ENCOUNTER — Telehealth: Payer: Self-pay | Admitting: Internal Medicine

## 2014-06-05 ENCOUNTER — Other Ambulatory Visit: Payer: Self-pay

## 2014-06-05 DIAGNOSIS — D6489 Other specified anemias: Secondary | ICD-10-CM

## 2014-06-05 DIAGNOSIS — D500 Iron deficiency anemia secondary to blood loss (chronic): Secondary | ICD-10-CM

## 2014-06-05 NOTE — Telephone Encounter (Signed)
Yes for 2 units?

## 2014-06-05 NOTE — Telephone Encounter (Signed)
Spoke to Dr. Celesta Aver office, they are entering in orders for [facility] [phone] [facility] to call and schedule for short stay or [phone] [facility] if we can not get pt in within the next 2 days.

## 2014-06-05 NOTE — Telephone Encounter (Signed)
The number provided by Dr. Tamala Julian is for short stay. Still does not help resolve this issue. We need to page family practice as before

## 2014-06-05 NOTE — Telephone Encounter (Signed)
Called Dr. Celesta Aver office. Pt is scheduled for sx on 9/14 at [facility] with Dr. Carlean Purl. Called his office to see if he would be able to put these orders in since he has hospital privileges. Daleen Snook will return our call.

## 2014-06-05 NOTE — Telephone Encounter (Signed)
I have spoken to short stay, Dee [phone]. They can not accept orders for this from Dr Joseph Art, the orders must come from a physician with hospital privileges, I have paged Family medical provider on call to see if they will accept patient for Dr Joseph Art to get the patient a transfusion.  Dr Joseph Art indicates he has spoken to the family practice resident on call and they will arrange for patient to have the transfusion. I called patient to let her know to expect a call from family practice, if she does not hear from them she is going to let me know.

## 2014-06-05 NOTE — Telephone Encounter (Signed)
Per OV- 2 units PRBC needed to be ordered. Pt needs to have blood typing also ordered per referrals. I have placed order. Please advise if this is the correct orders.

## 2014-06-05 NOTE — Telephone Encounter (Signed)
Thank you.  This is correct.

## 2014-06-05 NOTE — Telephone Encounter (Signed)
Dr. Carlean Purl ok if I place the orders in your name???

## 2014-06-05 NOTE — Telephone Encounter (Signed)
Orders placed [name] notified

## 2014-06-05 NOTE — Telephone Encounter (Signed)
Dr. Tamala Julian from [facility] called Dr. Joseph Art back. He said to call [phone] and they can get this set up.

## 2014-06-05 NOTE — Telephone Encounter (Signed)
I don't know how to proceed from here, the family practice resident was to help [place] get this done, not call us with a different number, we still can not give the orders. [name] states she will work on this, and find out what to do.

## 2014-06-05 NOTE — Telephone Encounter (Signed)
Paged Family [phone] to get the orders sent over for this transfusion and typing or advise to sent pt to ED.

## 2014-06-06 ENCOUNTER — Inpatient Hospital Stay (HOSPITAL_COMMUNITY): Admission: RE | Admit: 2014-06-06 | Payer: 59 | Source: Ambulatory Visit

## 2014-06-06 ENCOUNTER — Telehealth: Payer: Self-pay | Admitting: Internal Medicine

## 2014-06-06 ENCOUNTER — Ambulatory Visit (HOSPITAL_COMMUNITY)
Admission: RE | Admit: 2014-06-06 | Discharge: 2014-06-06 | Disposition: A | Discharge status: Home / Self Care | Payer: 59 | Source: Ambulatory Visit | Attending: Internal Medicine | Admitting: Internal Medicine

## 2014-06-06 VITALS — BP 124/79 | HR 78 | Temp 98.6°F | Resp 20 | Ht 67.0 in | Wt 322.0 lb

## 2014-06-06 DIAGNOSIS — D6489 Other specified anemias: Secondary | ICD-10-CM | POA: Insufficient documentation

## 2014-06-06 LAB — ABO/RH: ABO/RH(D): O POS

## 2014-06-06 LAB — PREPARE RBC (CROSSMATCH)

## 2014-06-06 MED ORDER — SODIUM CHLORIDE 0.9 % IV SOLN: Freq: Once | INTRAVENOUS | Status: DC

## 2014-06-06 MED ORDER — MOVIPREP 100 G PO SOLR: 1.0000 | Freq: Once | ORAL | Status: DC

## 2014-06-06 NOTE — Telephone Encounter (Signed)
[name],   Patient called asking for you.  She states no one called her to let her know about the blood transfusion.   I gave her the phone number to [facility] for her to call.   It appears she was scheduled for 8:00 am this morning.

## 2014-06-06 NOTE — Telephone Encounter (Signed)
The pharmacy was contacted and given another free voucher for the movi prep.

## 2014-06-06 NOTE — Telephone Encounter (Signed)
RX sent

## 2014-06-06 NOTE — Telephone Encounter (Signed)
Spoke to [facility] Stay had accepted her at 9am to do the transfusion. Pt was at the clinic when we spoke. She was doing well.

## 2014-06-07 LAB — TYPE AND SCREEN
ABO/RH(D): O POS
ANTIBODY SCREEN: NEGATIVE
UNIT DIVISION: 0
UNIT DIVISION: 0

## 2014-06-11 ENCOUNTER — Encounter (HOSPITAL_COMMUNITY)
Admission: RE | Disposition: A | Discharge status: Home / Self Care | Payer: Self-pay | Source: Ambulatory Visit | Attending: Internal Medicine

## 2014-06-11 ENCOUNTER — Encounter (HOSPITAL_COMMUNITY): Payer: Self-pay | Admitting: *Deleted

## 2014-06-11 ENCOUNTER — Ambulatory Visit (HOSPITAL_COMMUNITY)
Admission: RE | Admit: 2014-06-11 | Discharge: 2014-06-11 | Disposition: A | Discharge status: Home / Self Care | Payer: 59 | Source: Ambulatory Visit | Attending: Internal Medicine | Admitting: Internal Medicine

## 2014-06-11 DIAGNOSIS — K589 Irritable bowel syndrome without diarrhea: Secondary | ICD-10-CM | POA: Insufficient documentation

## 2014-06-11 DIAGNOSIS — D500 Iron deficiency anemia secondary to blood loss (chronic): Secondary | ICD-10-CM

## 2014-06-11 DIAGNOSIS — K5289 Other specified noninfective gastroenteritis and colitis: Secondary | ICD-10-CM | POA: Insufficient documentation

## 2014-06-11 DIAGNOSIS — K644 Residual hemorrhoidal skin tags: Secondary | ICD-10-CM | POA: Diagnosis present

## 2014-06-11 DIAGNOSIS — K5150 Left sided colitis without complications: Secondary | ICD-10-CM

## 2014-06-11 DIAGNOSIS — K648 Other hemorrhoids: Secondary | ICD-10-CM | POA: Insufficient documentation

## 2014-06-11 DIAGNOSIS — K529 Noninfective gastroenteritis and colitis, unspecified: Secondary | ICD-10-CM

## 2014-06-11 DIAGNOSIS — K921 Melena: Secondary | ICD-10-CM | POA: Diagnosis present

## 2014-06-11 HISTORY — DX: Left sided colitis without complications: K51.50

## 2014-06-11 HISTORY — DX: Residual hemorrhoidal skin tags: K64.4

## 2014-06-11 HISTORY — DX: Other hemorrhoids: K64.8

## 2014-06-11 HISTORY — PX: COLONOSCOPY: SHX5424

## 2014-06-11 SURGERY — COLONOSCOPY
Anesthesia: Moderate Sedation

## 2014-06-11 SURGERY — COLONOSCOPY
Anesthesia: Monitor Anesthesia Care

## 2014-06-11 MED ORDER — FENTANYL CITRATE 0.05 MG/ML IJ SOLN
INTRAMUSCULAR | Status: DC | PRN
  Administered 2014-06-11 (×3): 25 ug via INTRAVENOUS

## 2014-06-11 MED ORDER — MIDAZOLAM HCL 10 MG/2ML IJ SOLN
INTRAMUSCULAR | Status: AC
  Filled 2014-06-11: qty 2

## 2014-06-11 MED ORDER — SODIUM CHLORIDE 0.9 % IV SOLN: INTRAVENOUS | Status: DC

## 2014-06-11 MED ORDER — FENTANYL CITRATE 0.05 MG/ML IJ SOLN
INTRAMUSCULAR | Status: AC
  Filled 2014-06-11: qty 2

## 2014-06-11 MED ORDER — MIDAZOLAM HCL 5 MG/5ML IJ SOLN
INTRAMUSCULAR | Status: DC | PRN
  Administered 2014-06-11 (×3): 2 mg via INTRAVENOUS
  Administered 2014-06-11: 1 mg via INTRAVENOUS

## 2014-06-11 NOTE — Interval H&P Note (Signed)
History and Physical Interval Note:  06/11/2014 9:36 AM  [name]  has presented today for surgery, with the diagnosis of 280.0               iron def. anemia  The various methods of treatment have been discussed with the patient and family. After consideration of risks, benefits and other options for treatment, the patient has consented to  Procedure(s): COLONOSCOPY (N/A) as a surgical intervention .  The patient's history has been reviewed, patient examined, no change in status, stable for surgery.  I have reviewed the patient's chart and labs.  Questions were answered to the patient's satisfaction.     Silvano Rusk

## 2014-06-11 NOTE — H&P (View-Only) (Signed)
   Subjective:    Patient ID: [name], female    DOB: 07-23-1976, 38 y.o.   MRN: [id number]  HPI The patient is here with daily rectal bleeding, painless, after defecation, with clots at times. She is anemic and iron-defcient. I know her from 2007 evaluation - had internal hemorrhoid problems and bleeding despite  Surgery for these in 2006. Apparently improved but in past 2 years has developed recurrent daily bleeding. Seen at [facility] this week - anemia and iron-deficiency. Does not tolerate OTC iron. Has had menorrhea but since interventions that stopped.  Has wild swings from numerous loose stools to hard stool and days w/o defecation  Medications, allergies, past medical history, past surgical history, family history and social history are reviewed and updated in the EMR.  Review of Systems Menses slight    Objective:   Physical Exam General:  Well-developed, well-nourished and in no acute distress - obese Eyes:  anicteric. ENT:   Mouth and posterior pharynx free of lesions.  Neck:   supple w/o thyromegaly or mass.  Lungs: Clear to auscultation bilaterally. Heart:  S1S2, no rubs, murmurs, gallops. Abdomen:  soft, non-tender, no hepatosplenomegaly, hernia, or mass and BS+. - obese Rectal:  [name], [name] present.  Anoderm inspection revealed normal findings Anal wink was present Digital exam revealed normal resting tone and voluntary squeeze. No mass or rectocele present. Simulated defecation with valsalva revealed appropriate abdominal contraction and descent.     Anoscopy was performed with the patient in the left lateral decubitus position while a chaperone was present and revealed Grade 2 internal hemorrhoids all positions    Lymph:  no cervical or supraclavicular adenopathy. Extremities:   no edema Skin   no rash. Neuro:  A&O x 3.  Psych:  appropriate mood and  Affect.   Data Reviewed: Lab Results  Component Value Date   WBC 10.8* 05/26/2014   HGB  8.3* 05/26/2014   HCT 27.1* 05/26/2014   MCV 75.1* 05/26/2014   PLT 283 04/27/2012   Lab Results  Component Value Date   FERRITIN 4* 05/26/2014       Assessment & Plan:  Iron deficiency anemia secondary to blood loss (chronic) - Plan: Ferrous Fumarate-Folic Acid 734-1 MG TABS, 0.9 %  sodium chloride infusion, 0.9 %  sodium chloride infusion, Ambulatory referral to Gastroenterology  Internal hemorrhoids with other complication  IBS (irritable bowel syndrome)  1) Hemocyte 2) Colonoscopy to be more certain bleeding os from hemorrhoid. The risks and benefits as well as alternatives of endoscopic procedure(s) have been discussed and reviewed. All questions answered. The patient agrees to proceed. 3) Benefiber 2 tbsp daily  PF:[id number],HGDJ, MD

## 2014-06-11 NOTE — Op Note (Signed)
[facility] [place], [address]   COLONOSCOPY PROCEDURE REPORT  PATIENT: Katherine, Lutz  MR#: [id number] BIRTHDATE: 11-Apr-1976 , 38  yrs. old GENDER: Female ENDOSCOPIST: Gatha Mayer, MD, [facility] PROCEDURE DATE:  06/11/2014 PROCEDURE:   Colonoscopy with biopsy First Screening Colonoscopy - Avg.  risk and is 50 yrs.  old or older - No.  Prior Negative Screening - Now for repeat screening. N/A      Polyps Removed Today? No.  Recommend repeat exam, <10 yrs? No. ASA CLASS: INDICATIONS:Rectal Bleeding and Iron Deficiency Anemia. MEDICATIONS: Fentanyl 75 mcg IV and Versed 7 mg IV  DESCRIPTION OF PROCEDURE:   After the risks benefits and alternatives of the procedure were thoroughly explained, informed consent was obtained.  A digital rectal exam revealed no abnormalities of the rectum.   The Pentax Adult Colonscope [id number] endoscope was introduced through the anus and advanced to the cecum, which was identified by both the appendix and ileocecal valve. No adverse events experienced.   The quality of the prep was excellent, using MoviPrep  The instrument was then slowly withdrawn as the colon was fully examined.  COLON FINDINGS: Segmental colitis in sigmoid colon 35-45 cm. granular and petechiae.  Biopsies taken.   The colon mucosa was otherwise normal.   A right colon retroflexion was performed. Retroflexed views revealed internal/external hemorrhoids. The time to cecum=4 minutes 0 seconds.  Withdrawal time=8 minutes 0 seconds. The scope was withdrawn and the procedure completed. COMPLICATIONS: There were no complications.  ENDOSCOPIC IMPRESSION: 1.   Segmental colitis in sigmoid colon 35-45 cm.  granular and petechiae.  Biopsies taken. 2.   The colon mucosa was otherwise normal 3.   External hemorrhoids 4.   Internal hemorrhoids  RECOMMENDATIONS: Await biopsy results - bleeding from hemorrhoids but ? colitis contribution   eSigned:  Gatha Mayer, MD, [facility] 06/11/2014 10:20 AM   cc: Robyn Haber, MD and The Patient

## 2014-06-11 NOTE — Discharge Instructions (Signed)
You have hemorrhoids but I also saw an area of inflammation in the colon. I took biopsies to see what that is and will let you know this week.  I appreciate the opportunity to care for you. Gatha Mayer, MD, FACG  YOU HAD AN ENDOSCOPIC PROCEDURE TODAY: Refer to the procedure report and other information in the discharge instructions given to you for any specific questions about what was found during the examination. If this information does not answer your questions, please call Dr. Celesta Aver office at [phone] to clarify.   YOU SHOULD EXPECT: Some feelings of bloating in the abdomen. Passage of more gas than usual. Walking can help get rid of the air that was put into your GI tract during the procedure and reduce the bloating. If you had a lower endoscopy (such as a colonoscopy or flexible sigmoidoscopy) you may notice spotting of blood in your stool or on the toilet paper. Some abdominal soreness may be present for a day or two, also.  DIET: Your first meal following the procedure should be a light meal and then it is ok to progress to your normal diet. A half-sandwich or bowl of soup is an example of a good first meal. Heavy or fried foods are harder to digest and may make you feel nauseous or bloated. Drink plenty of fluids but you should avoid alcoholic beverages for 24 hours.   ACTIVITY: Your care partner should take you home directly after the procedure. You should plan to take it easy, moving slowly for the rest of the day. You can resume normal activity the day after the procedure however YOU SHOULD NOT DRIVE, use power tools, machinery or perform tasks that involve climbing or major physical exertion for 24 hours (because of the sedation medicines used during the test).   SYMPTOMS TO REPORT IMMEDIATELY: A gastroenterologist can be reached at any hour. Please call [phone]  for any of the following symptoms:  Following lower endoscopy (colonoscopy, flexible  sigmoidoscopy) Excessive amounts of blood in the stool  Significant tenderness, worsening of abdominal pains  Swelling of the abdomen that is new, acute  Fever of 100 or higher  Following upper endoscopy (EGD, EUS, ERCP, esophageal dilation) Vomiting of blood or coffee ground material  New, significant abdominal pain  New, significant chest pain or pain under the shoulder blades  Painful or persistently difficult swallowing  New shortness of breath  Black, tarry-looking or red, bloody stools  FOLLOW UP:  If any biopsies were taken you will be contacted by phone or by letter within the next 1-3 weeks. Call [phone]  if you have not heard about the biopsies in 3 weeks.  Please also call with any specific questions about appointments or follow up tests.  Colonoscopy, Care After These instructions give you information on caring for yourself after your procedure. Your doctor may also give you more specific instructions. Call your doctor if you have any problems or questions after your procedure. HOME CARE  Do not drive for 24 hours.  Do not sign important papers or use machinery for 24 hours.  You may shower.  You may go back to your usual activities, but go slower for the first 24 hours.  Take rest breaks often during the first 24 hours.  Walk around or use warm packs on your belly (abdomen) if you have belly cramping or gas.  Drink enough fluids to keep your pee (urine) clear or pale yellow.  Resume your normal diet. Avoid heavy or  fried foods.  Avoid drinking alcohol for 24 hours or as told by your doctor.  Only take medicines as told by your doctor. If a tissue sample (biopsy) was taken during the procedure:   Do not take aspirin or blood thinners for 7 days, or as told by your doctor.  Do not drink alcohol for 7 days, or as told by your doctor.  Eat soft foods for the first 24 hours. GET HELP IF: You still have a small amount of blood in your poop (stool) 2-3 days  after the procedure. GET HELP RIGHT AWAY IF:  You have more than a small amount of blood in your poop.  You see clumps of tissue (blood clots) in your poop.  Your belly is puffy (swollen).  You feel sick to your stomach (nauseous) or throw up (vomit).  You have a fever.  You have belly pain that gets worse and medicine does not help. MAKE SURE YOU:  Understand these instructions.  Will watch your condition.  Will get help right away if you are not doing well or get worse. Document Released: 10/17/2010 Document Revised: 09/19/2013 Document Reviewed: 05/22/2013 [facility] Patient Information 2015 [place], [place]. This information is not intended to replace advice given to you by your health care provider. Make sure you discuss any questions you have with your health care provider.

## 2014-06-12 ENCOUNTER — Encounter (HOSPITAL_COMMUNITY): Payer: Self-pay | Admitting: Internal Medicine

## 2014-06-14 ENCOUNTER — Encounter: Payer: Self-pay | Admitting: Internal Medicine

## 2014-06-14 NOTE — Progress Notes (Signed)
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

## 2014-06-15 ENCOUNTER — Other Ambulatory Visit: Payer: Self-pay

## 2014-06-15 DIAGNOSIS — K529 Noninfective gastroenteritis and colitis, unspecified: Secondary | ICD-10-CM

## 2014-06-15 MED ORDER — MESALAMINE 1.2 G PO TBEC: 2.4000 g | DELAYED_RELEASE_TABLET | Freq: Every day | ORAL | Status: DC

## 2014-06-29 ENCOUNTER — Other Ambulatory Visit (INDEPENDENT_AMBULATORY_CARE_PROVIDER_SITE_OTHER): Payer: 59

## 2014-06-29 DIAGNOSIS — K529 Noninfective gastroenteritis and colitis, unspecified: Secondary | ICD-10-CM

## 2014-06-29 LAB — CBC WITH DIFFERENTIAL/PLATELET
BASOS ABS: 0 10*3/uL (ref 0.0–0.1)
Basophils Relative: 0.3 % (ref 0.0–3.0)
EOS ABS: 0.3 10*3/uL (ref 0.0–0.7)
Eosinophils Relative: 1.8 % (ref 0.0–5.0)
HCT: 32.4 % — ABNORMAL LOW (ref 36.0–46.0)
Hemoglobin: 10 g/dL — ABNORMAL LOW (ref 12.0–15.0)
LYMPHS PCT: 22 % (ref 12.0–46.0)
Lymphs Abs: 3.3 10*3/uL (ref 0.7–4.0)
MCHC: 30.9 g/dL (ref 30.0–36.0)
MCV: 80.4 fl (ref 78.0–100.0)
MONOS PCT: 5.2 % (ref 3.0–12.0)
Monocytes Absolute: 0.8 10*3/uL (ref 0.1–1.0)
NEUTROS PCT: 70.7 % (ref 43.0–77.0)
Neutro Abs: 10.5 10*3/uL — ABNORMAL HIGH (ref 1.4–7.7)
PLATELETS: 418 10*3/uL — AB (ref 150.0–400.0)
RBC: 4.03 Mil/uL (ref 3.87–5.11)
RDW: 23.1 % — AB (ref 11.5–15.5)
WBC: 14.9 10*3/uL — ABNORMAL HIGH (ref 4.0–10.5)

## 2014-07-01 NOTE — Progress Notes (Signed)
Quick Note:  Hgb much better WBC up but has been similar before Stay on ferrous fumarate and see me as planned ______

## 2014-08-14 ENCOUNTER — Ambulatory Visit: Payer: 59 | Admitting: Internal Medicine

## 2014-08-14 ENCOUNTER — Other Ambulatory Visit: Payer: Self-pay | Admitting: Internal Medicine

## 2014-08-14 ENCOUNTER — Telehealth: Payer: Self-pay

## 2014-08-14 DIAGNOSIS — D509 Iron deficiency anemia, unspecified: Secondary | ICD-10-CM

## 2014-08-14 NOTE — Telephone Encounter (Signed)
Patient called in and said she thought her appointment was tomorrow.  She is still having a lot of blood in stool, some cramping, light headed, no fever.

## 2014-08-14 NOTE — Telephone Encounter (Signed)
-----   Message from Gatha Mayer, MD sent at 08/14/2014 10:47 AM EST ----- Regarding: No show f/u Please call her and see if you can 1) Get a GI sx update (colitis - diarrhea??) 2) find out why she missed

## 2014-08-14 NOTE — Telephone Encounter (Signed)
Left message for her to call me so I can get symptom update, etc.

## 2014-08-14 NOTE — Telephone Encounter (Signed)
1) review medlist please so we know what she is taking 2) Have her do a CBC re: anemia

## 2014-08-15 ENCOUNTER — Other Ambulatory Visit (INDEPENDENT_AMBULATORY_CARE_PROVIDER_SITE_OTHER): Payer: 59

## 2014-08-15 DIAGNOSIS — D509 Iron deficiency anemia, unspecified: Secondary | ICD-10-CM

## 2014-08-15 LAB — CBC WITH DIFFERENTIAL/PLATELET
BASOS PCT: 0.5 % (ref 0.0–3.0)
Basophils Absolute: 0.1 10*3/uL (ref 0.0–0.1)
EOS PCT: 1.7 % (ref 0.0–5.0)
Eosinophils Absolute: 0.3 10*3/uL (ref 0.0–0.7)
HEMATOCRIT: 31.1 % — AB (ref 36.0–46.0)
HEMOGLOBIN: 9.7 g/dL — AB (ref 12.0–15.0)
LYMPHS ABS: 3.8 10*3/uL (ref 0.7–4.0)
LYMPHS PCT: 23.6 % (ref 12.0–46.0)
MCHC: 31.3 g/dL (ref 30.0–36.0)
MCV: 80.4 fl (ref 78.0–100.0)
MONOS PCT: 5.4 % (ref 3.0–12.0)
Monocytes Absolute: 0.9 10*3/uL (ref 0.1–1.0)
NEUTROS ABS: 11 10*3/uL — AB (ref 1.4–7.7)
Neutrophils Relative %: 68.8 % (ref 43.0–77.0)
Platelets: 373 10*3/uL (ref 150.0–400.0)
RBC: 3.87 Mil/uL (ref 3.87–5.11)
RDW: 20.3 % — ABNORMAL HIGH (ref 11.5–15.5)
WBC: 16 10*3/uL — AB (ref 4.0–10.5)

## 2014-08-15 NOTE — Telephone Encounter (Signed)
Spoke with patient, updated medicine list and patient hopes to come by today after work for lab draw.

## 2014-08-16 NOTE — Progress Notes (Signed)
Quick Note:  Hgb stable - should be going up on iron but may not be with continued bleeding  1) Increase Lialda to 2.4 g bid - will need new Rx 2) Dicylomine 20 mg tidac/hs 2) Increase iron to bid 3) Needs F/u with me or APP in about 4-6 weeks, CBC1-2d before that appointment  4) If not starting to feel better in next 1-2 weeks call back ______

## 2014-08-20 ENCOUNTER — Other Ambulatory Visit: Payer: Self-pay

## 2014-08-20 MED ORDER — HEMAX 150-1 MG PO TABS: 1.0000 | ORAL_TABLET | Freq: Two times a day (BID) | ORAL | Status: DC

## 2014-08-20 MED ORDER — MESALAMINE 1.2 G PO TBEC: 2.4000 g | DELAYED_RELEASE_TABLET | Freq: Two times a day (BID) | ORAL | Status: DC

## 2014-08-20 MED ORDER — DICYCLOMINE HCL 20 MG PO TABS: 20.0000 mg | ORAL_TABLET | Freq: Three times a day (TID) | ORAL | Status: DC

## 2014-09-04 ENCOUNTER — Ambulatory Visit (INDEPENDENT_AMBULATORY_CARE_PROVIDER_SITE_OTHER): Payer: 59 | Admitting: Physician Assistant

## 2014-09-04 VITALS — BP 124/78 | HR 89 | Temp 98.5°F | Resp 18 | Ht 67.5 in | Wt 307.2 lb

## 2014-09-04 DIAGNOSIS — D509 Iron deficiency anemia, unspecified: Secondary | ICD-10-CM

## 2014-09-04 DIAGNOSIS — J029 Acute pharyngitis, unspecified: Secondary | ICD-10-CM

## 2014-09-04 DIAGNOSIS — R05 Cough: Secondary | ICD-10-CM

## 2014-09-04 DIAGNOSIS — R059 Cough, unspecified: Secondary | ICD-10-CM

## 2014-09-04 DIAGNOSIS — D500 Iron deficiency anemia secondary to blood loss (chronic): Secondary | ICD-10-CM

## 2014-09-04 DIAGNOSIS — R0982 Postnasal drip: Secondary | ICD-10-CM

## 2014-09-04 LAB — POCT CBC
GRANULOCYTE PERCENT: 64.3 % (ref 37–80)
HCT, POC: 33.1 % — AB (ref 37.7–47.9)
Hemoglobin: 10 g/dL — AB (ref 12.2–16.2)
Lymph, poc: 3.5 — AB (ref 0.6–3.4)
MCH, POC: 24.4 pg — AB (ref 27–31.2)
MCHC: 30.2 g/dL — AB (ref 31.8–35.4)
MCV: 80.6 fL (ref 80–97)
MID (CBC): 0.8 (ref 0–0.9)
MPV: 8.4 fL (ref 0–99.8)
PLATELET COUNT, POC: 411 10*3/uL (ref 142–424)
POC Granulocyte: 7.7 — AB (ref 2–6.9)
POC LYMPH PERCENT: 29.2 %L (ref 10–50)
POC MID %: 6.5 % (ref 0–12)
RBC: 4.1 M/uL (ref 4.04–5.48)
RDW, POC: 18.1 %
WBC: 11.9 10*3/uL — AB (ref 4.6–10.2)

## 2014-09-04 MED ORDER — BENZONATATE 100 MG PO CAPS: 100.0000 mg | ORAL_CAPSULE | Freq: Three times a day (TID) | ORAL | Status: DC | PRN

## 2014-09-04 MED ORDER — MAGIC MOUTHWASH W/LIDOCAINE: 10.0000 mL | ORAL | Status: DC | PRN

## 2014-09-04 MED ORDER — IPRATROPIUM BROMIDE 0.03 % NA SOLN: 2.0000 | Freq: Two times a day (BID) | NASAL | Status: DC

## 2014-09-04 MED ORDER — HYDROCOD POLST-CHLORPHEN POLST 10-8 MG/5ML PO LQCR: 5.0000 mL | Freq: Two times a day (BID) | ORAL | Status: DC | PRN

## 2014-09-04 NOTE — Progress Notes (Signed)
Subjective:    Patient ID: [name], female    DOB: [birth year], 38 y.o.   MRN: [id number]  HPI  This is a 38 year old female with PMH colitis and anemia who is presenting with cough, nasal congestion and sore throat x 2 days. She has vomited 5 times. She states the scratchiness of her throat caused her mouth to watch and triggers her gag reflex. The cough is dry. She hasn't been around anyone sick. She has tried throat lozenges with some relief.  She has an albuterol that she uses when she sick. She has used it some due to SOB with exertion over the past couple days. She denies wheezing, ear pain, sinus pressure, fever or chills.  She is having surgery in two days for meniscectomy.   She has chronic blood loss anemia d/t colitis and recently had 2 units rbcs transfused. She is followed by Dr. Carlean Purl for this.  Review of Systems  Constitutional: Negative for fever and chills.  HENT: Positive for congestion, postnasal drip and sore throat. Negative for ear pain and sinus pressure.   Eyes: Negative for redness.  Respiratory: Positive for cough and shortness of breath. Negative for wheezing.   Gastrointestinal: Positive for nausea and vomiting. Negative for abdominal pain and diarrhea.  Musculoskeletal: Positive for arthralgias.  Skin: Negative for rash.  Allergic/Immunologic: Positive for environmental allergies.  Neurological: Negative for dizziness and light-headedness.  Psychiatric/Behavioral: Positive for sleep disturbance.   Patient Active Problem List   Diagnosis Date Noted  . Left sided chronic colitis - segmental 06/11/2014  . Internal and external bleeding hemorrhoids 06/11/2014  . Asthma 04/26/2012  . Excessive or frequent menstruation 10/16/2011  . DEPRESSION, MILD 10/02/2009  . Obesity, unspecified 11/15/2007  . INTERNAL HEMORRHOIDS 11/15/2007   Prior to Admission medications   Medication Sig Start Date End Date Taking? Authorizing Provider  acetaminophen  (TYLENOL) 500 MG tablet Take 1,000 mg by mouth every 6 (six) hours as needed for mild pain or moderate pain.    Yes Historical Provider, MD  albuterol (PROVENTIL HFA;VENTOLIN HFA) 108 (90 BASE) MCG/ACT inhaler Inhale 2 puffs into the lungs every 6 (six) hours as needed for wheezing or shortness of breath. For shortness of breath   Yes Historical Provider, MD  dicyclomine (BENTYL) 20 MG tablet Take 1 tablet (20 mg total) by mouth 4 (four) times daily -  before meals and at bedtime. 08/20/14  Yes Gatha Mayer, MD  Iron-DSS-B12-FA-C-E-Cu-Biotin [facility]) 150-1 MG TABS Take 1 tablet by mouth 2 (two) times daily. 08/20/14  Yes Gatha Mayer, MD  mesalamine (LIALDA) 1.2 G EC tablet Take 2 tablets (2.4 g total) by mouth 2 (two) times daily. 08/20/14  Yes Gatha Mayer, MD                               Allergies  Allergen Reactions  . Oxycodone Anaphylaxis  . Ciprofloxacin Itching and Rash  . Doxycycline Nausea And Vomiting  . Penicillins Rash   Patient's social and family history were reviewed.     Objective:   Physical Exam  Constitutional: She is oriented to person, place, and time. She appears well-developed and well-nourished. No distress.  HENT:  Head: Normocephalic and atraumatic.  Right Ear: Hearing, external ear and ear canal normal. Tympanic membrane is retracted.  Left Ear: Hearing, tympanic membrane, external ear and ear canal normal.  Nose: Mucosal edema present.  Mouth/Throat: Uvula  is midline and mucous membranes are normal. Posterior oropharyngeal erythema present. No oropharyngeal exudate, posterior oropharyngeal edema or tonsillar abscesses.  Eyes: Conjunctivae and lids are normal. Right eye exhibits no discharge. Left eye exhibits no discharge. No scleral icterus.  Neck: Trachea normal.  Cardiovascular: Normal rate and regular rhythm.   Pulmonary/Chest: Effort normal. No respiratory distress.  Musculoskeletal: Normal range of motion.  Neurological: She is alert and  oriented to person, place, and time.  Skin: Skin is warm, dry and intact. No lesion and no rash noted.  Psychiatric: She has a normal mood and affect. Her speech is normal and behavior is normal. Thought content normal.    Results for orders placed or performed in visit on 09/04/14  POCT CBC  Result Value Ref Range   WBC 11.9 (A) 4.6 - 10.2 K/uL   Lymph, poc 3.5 (A) 0.6 - 3.4   POC LYMPH PERCENT 29.2 10 - 50 %L   MID (cbc) 0.8 0 - 0.9   POC MID % 6.5 0 - 12 %M   POC Granulocyte 7.7 (A) 2 - 6.9   Granulocyte percent 64.3 37 - 80 %G   RBC 4.10 4.04 - 5.48 M/uL   Hemoglobin 10.0 (A) 12.2 - 16.2 g/dL   HCT, POC 33.1 (A) 37.7 - 47.9 %   MCV 80.6 80 - 97 fL   MCH, POC 24.4 (A) 27 - 31.2 pg   MCHC 30.2 (A) 31.8 - 35.4 g/dL   RDW, POC 18.1 %   Platelet Count, POC 411 142 - 424 K/uL   MPV 8.4 0 - 99.8 fL      Assessment & Plan:  1. Iron deficiency anemia 2. Anemia due to chronic blood loss hgb stable at 10.2 (improved from 8.3 four months ago). Wbc 11.9, but she tends to run high. I am not worried about infection at this time. She will follow up with GI for colitis. She will follow up with ortho and make sure she is ok for surgery on 09/06/14. - POCT CBC  3. Cough 4. Sore throat 5. Postnasal drip This is likely viral. Focus is on supportive care, see medications prescribed below. She will return in 7-10 days if symptoms are not improving.  - POCT CBC - benzonatate (TESSALON) 100 MG capsule; Take 1-2 capsules (100-200 mg total) by mouth 3 (three) times daily as needed for cough.  Dispense: 40 capsule; Refill: 0 - chlorpheniramine-HYDROcodone (TUSSIONEX PENNKINETIC ER) 10-8 MG/5ML LQCR; Take 5 mLs by mouth every 12 (twelve) hours as needed for cough (cough).  Dispense: 80 mL; Refill: 0 - Alum & Mag Hydroxide-Simeth (MAGIC MOUTHWASH W/LIDOCAINE) SOLN; Take 10 mLs by mouth every 2 (two) hours as needed for mouth pain.  Dispense: 360 mL; Refill: 0 - ipratropium (ATROVENT) 0.03 % nasal  spray; Place 2 sprays into both nostrils 2 (two) times daily.  Dispense: 30 mL; Refill: 0   Nyiesha Beever V. Drenda Freeze, MHS [facility]  09/04/2014

## 2014-09-04 NOTE — Patient Instructions (Signed)
Use nasal spray twice a day. Take tessalon perles as directed. You may gargle mouthwash every 2 hours as needed for pain. Don't swallow. You can take cough syrup before bed to help you sleep.  Good luck with your surgery!

## 2014-09-06 HISTORY — PX: KNEE ARTHROSCOPY: SUR90

## 2014-09-06 NOTE — Progress Notes (Signed)
The patient was discussed with me and I agree with the diagnosis and treatment plan.  

## 2014-09-07 ENCOUNTER — Encounter: Payer: Self-pay | Admitting: Internal Medicine

## 2014-09-27 ENCOUNTER — Encounter (HOSPITAL_COMMUNITY): Payer: Self-pay | Admitting: *Deleted

## 2014-09-27 ENCOUNTER — Emergency Department (HOSPITAL_COMMUNITY)
Admission: EM | Admit: 2014-09-27 | Discharge: 2014-09-27 | Disposition: A | Discharge status: Home / Self Care | Payer: 59 | Source: Home / Self Care | Attending: Emergency Medicine | Admitting: Emergency Medicine

## 2014-09-27 DIAGNOSIS — J392 Other diseases of pharynx: Secondary | ICD-10-CM

## 2014-09-27 MED ORDER — VALACYCLOVIR HCL 1 G PO TABS: 1000.0000 mg | ORAL_TABLET | Freq: Two times a day (BID) | ORAL | Status: AC

## 2014-09-27 NOTE — ED Provider Notes (Signed)
CSN: [id number]     Arrival date & time 09/27/14  1849 History   First MD Initiated Contact with Patient 09/27/14 1914     Chief Complaint  Patient presents with  . Sore Throat   (Consider location/radiation/quality/duration/timing/severity/associated sxs/prior Treatment) HPI She is a 38 year old woman here for evaluation of right sided throat discomfort. This started about 2 hours ago. It is described as painful and itchy. No fevers or chills. No nasal congestion or cough. No new medications or foods. She denies any injury. No history of cold sores or fever blisters.  Past Medical History  Diagnosis Date  . Bronchitis   . Asthma   . Clostridium difficile infection 04/20/2012  . Obesity   . Osteoarthritis     Both knees  . Shortness of breath     on exertion  . Internal and external bleeding hemorrhoids 06/11/2014  . Left sided chronic colitis - segmental 06/11/2014  . TOBACCO USER 10/02/2009    Qualifier: Diagnosis of  By: Dimas Millin MD, Ellard Artis     Past Surgical History  Procedure Laterality Date  . Hemrrhoid surgery    . Colonoscopy  2007    for rectal bleeding; [facility]  . Laparoscopic tubal ligation  10/16/2011    Procedure: LAPAROSCOPIC TUBAL LIGATION;  Surgeon: Alwyn Pea, MD;  Location: [facility];  Service: Gynecology;  Laterality: Bilateral;  . Foot surgery    . Tubal ligation    . Colonoscopy N/A 06/11/2014    Procedure: COLONOSCOPY;  Surgeon: Gatha Mayer, MD;  Location: [facility];  Service: Endoscopy;  Laterality: N/A;  . Knee arthroscopy Left 09/06/2014   Family History  Problem Relation Age of Onset  . Hypertension Mother   . Diabetes Mother   . Sarcoidosis Mother     lungs and skin  . Asthma Mother   . Hypertension Father   . Diabetes Father   . Asthma Son   . Asthma Sister   . Asthma Brother   . Asthma Daughter     died age 65.5  . Cancer Daughter 4    brain; died age 65.5  . Asthma Son    History  Substance Use Topics  . Smoking status: Former  Smoker -- 0.50 packs/day for 20 years    Types: Cigarettes    Quit date: 03/17/2014  . Smokeless tobacco: Never Used  . Alcohol Use: No   OB History    No data available     Review of Systems  Constitutional: Negative for fever.  HENT: Positive for sore throat. Negative for congestion.   Respiratory: Negative for cough.     Allergies  Oxycodone; Ciprofloxacin; Doxycycline; and Penicillins  Home Medications   Prior to Admission medications   Medication Sig Start Date End Date Taking? Authorizing Provider  albuterol (PROVENTIL HFA;VENTOLIN HFA) 108 (90 BASE) MCG/ACT inhaler Inhale 2 puffs into the lungs every 6 (six) hours as needed for wheezing or shortness of breath. For shortness of breath   Yes Historical Provider, MD  dicyclomine (BENTYL) 20 MG tablet Take 1 tablet (20 mg total) by mouth 4 (four) times daily -  before meals and at bedtime. 08/20/14  Yes Gatha Mayer, MD  ibuprofen (ADVIL,MOTRIN) 200 MG tablet Take 400 mg by mouth every 6 (six) hours as needed for headache.   Yes Historical Provider, MD  ipratropium (ATROVENT) 0.03 % nasal spray Place 2 sprays into both nostrils 2 (two) times daily. 09/04/14  Yes Nicole Bush V, PA-C  Iron-DSS-B12-FA-C-E-Cu-Biotin ([place])  150-1 MG TABS Take 1 tablet by mouth 2 (two) times daily. 08/20/14  Yes Gatha Mayer, MD  mesalamine (LIALDA) 1.2 G EC tablet Take 2 tablets (2.4 g total) by mouth 2 (two) times daily. 08/20/14  Yes Gatha Mayer, MD  acetaminophen (TYLENOL) 500 MG tablet Take 1,000 mg by mouth every 6 (six) hours as needed for mild pain or moderate pain.     Historical Provider, MD  Alum & Mag Hydroxide-Simeth (MAGIC MOUTHWASH W/LIDOCAINE) SOLN Take 10 mLs by mouth every 2 (two) hours as needed for mouth pain. 09/04/14   Ezekiel Slocumb, PA-C  benzonatate (TESSALON) 100 MG capsule Take 1-2 capsules (100-200 mg total) by mouth 3 (three) times daily as needed for cough. 09/04/14   Ezekiel Slocumb, PA-C  chlorpheniramine-HYDROcodone  (TUSSIONEX PENNKINETIC ER) 10-8 MG/5ML LQCR Take 5 mLs by mouth every 12 (twelve) hours as needed for cough (cough). 09/04/14   Ezekiel Slocumb, PA-C  valACYclovir (VALTREX) 1000 MG tablet Take 1 tablet (1,000 mg total) by mouth 2 (two) times daily. 09/27/14 10/11/14  Melony Overly, MD   BP 142/97 mmHg  Pulse 102  Temp(Src) 98.1 F (36.7 C) (Oral)  Resp 16  SpO2 98%  LMP 09/20/2014 Physical Exam  Constitutional: She is oriented to person, place, and time. She appears well-developed and well-nourished. No distress.  HENT:  Mouth/Throat: No oropharyngeal exudate, posterior oropharyngeal edema or posterior oropharyngeal erythema.    Cardiovascular: Normal rate.   Pulmonary/Chest: Effort normal.  Neurological: She is alert and oriented to person, place, and time.    ED Course  Procedures (including critical care time) Labs Review Labs Reviewed - No data to display  Imaging Review No results found.   MDM   1. Throat irritation    I suspect she irritated her posterior palate with either rough food or hot food. I suspect this will resolve on its own over the next 1-2 days. Primary oral herpes is a concern. A prescription for Valtrex was given to be filled if she develops fevers, additional lesions, or worsening sore throat. If she starts feeling like her throat is closing she will go to the emergency room.    Melony Overly, MD 09/27/14 [phone]

## 2014-09-27 NOTE — Discharge Instructions (Signed)
You likely irritated that spot of your throat with something you ate. I suspect it will resolve on its own over the next 1-2 days. You can use Chloraseptic spray to help with the pain and itching.  If you develop fevers, more lesions in your mouth, or a very painful throat, please start taking the Valtrex. If you feel like your throat is closing up, go straight to the emergency room.

## 2014-09-27 NOTE — ED Notes (Signed)
C/o painful and itching spot on R side of throat onset 1 1/2 hrs ago.  C/o headache yesterday and today.

## 2014-10-16 ENCOUNTER — Ambulatory Visit: Payer: 59 | Admitting: Internal Medicine

## 2014-10-16 ENCOUNTER — Encounter: Payer: Self-pay | Admitting: Internal Medicine

## 2014-10-16 ENCOUNTER — Ambulatory Visit (INDEPENDENT_AMBULATORY_CARE_PROVIDER_SITE_OTHER): Payer: 59 | Admitting: Internal Medicine

## 2014-10-16 VITALS — BP 142/84 | HR 107 | Ht 67.0 in | Wt 310.0 lb

## 2014-10-16 DIAGNOSIS — K51511 Left sided colitis with rectal bleeding: Secondary | ICD-10-CM

## 2014-10-16 DIAGNOSIS — K644 Residual hemorrhoidal skin tags: Secondary | ICD-10-CM

## 2014-10-16 DIAGNOSIS — K602 Anal fissure, unspecified: Secondary | ICD-10-CM

## 2014-10-16 DIAGNOSIS — K648 Other hemorrhoids: Secondary | ICD-10-CM

## 2014-10-16 HISTORY — DX: Anal fissure, unspecified: K60.2

## 2014-10-16 MED ORDER — DILTIAZEM GEL 2 %: 1.0000 "application " | Freq: Three times a day (TID) | CUTANEOUS | Status: DC

## 2014-10-16 MED ORDER — MESALAMINE 1000 MG RE SUPP: 1000.0000 mg | Freq: Every day | RECTAL | Status: DC

## 2014-10-16 NOTE — Assessment & Plan Note (Signed)
Canasa samples x 9

## 2014-10-16 NOTE — Patient Instructions (Addendum)
Today we are giving you samples of Lialda and canasa suppositories to use.  We are providing you with a handout to read and follow on benefiber.  You've been given a printed rx for diltiazem gel to take to the pharmacy.   Call # [phone] to sign up for the bariatric surgery option seminar.   Follow up with Dr Carlean Purl in 3 months.   I appreciate the opportunity to care for you. Silvano Rusk, M.D., [facility]

## 2014-10-16 NOTE — Assessment & Plan Note (Signed)
Diltiazem gel benefiber

## 2014-10-16 NOTE — Progress Notes (Signed)
   Subjective:    Patient ID: [name], female    DOB: May 27, 1976, 39 y.o.   MRN: [id number]  HPI Here for f/u of left-sided UC and hemorrhoids. Has been out of work after knee surgery. Money is short so not on Lialda and having some increasing diarrhea and rectal bleeding. Hoping to get short-term disability. Still having anal and rectal pain with defecation.  Medications, allergies, past medical history, past surgical history, family history and social history are reviewed and updated in the EMR.   Review of Systems As above    Objective:   Physical Exam BP 142/84 mmHg  Pulse 107  Ht 5' 7"  (1.702 m)  Wt 310 lb (140.615 kg)  BMI 48.54 kg/m2  SpO2 98%  LMP 09/20/2014  Obese, soft abd and NT Rectal with female staff present  Tender - posterior with mild stenosis and induration in posterior anal canal       Assessment & Plan:  Left sided chronic colitis - segmental Try to sample Lialda   Internal and external bleeding hemorrhoids Canasa samples x 9   Anal fissure - posterior Diltiazem gel benefiber   RTC 3 months

## 2014-10-16 NOTE — Assessment & Plan Note (Signed)
Try to sample Lialda

## 2015-03-12 ENCOUNTER — Emergency Department (HOSPITAL_COMMUNITY): Payer: 59

## 2015-03-12 ENCOUNTER — Encounter (HOSPITAL_COMMUNITY): Payer: Self-pay | Admitting: Emergency Medicine

## 2015-03-12 ENCOUNTER — Emergency Department (HOSPITAL_COMMUNITY)
Admission: EM | Admit: 2015-03-12 | Discharge: 2015-03-12 | Disposition: A | Discharge status: Home / Self Care | Payer: 59 | Attending: Emergency Medicine | Admitting: Emergency Medicine

## 2015-03-12 DIAGNOSIS — J45909 Unspecified asthma, uncomplicated: Secondary | ICD-10-CM | POA: Diagnosis not present

## 2015-03-12 DIAGNOSIS — J3489 Other specified disorders of nose and nasal sinuses: Secondary | ICD-10-CM | POA: Diagnosis not present

## 2015-03-12 DIAGNOSIS — Z8619 Personal history of other infectious and parasitic diseases: Secondary | ICD-10-CM | POA: Insufficient documentation

## 2015-03-12 DIAGNOSIS — Z79899 Other long term (current) drug therapy: Secondary | ICD-10-CM | POA: Diagnosis not present

## 2015-03-12 DIAGNOSIS — Z880 Allergy status to penicillin: Secondary | ICD-10-CM | POA: Insufficient documentation

## 2015-03-12 DIAGNOSIS — M170 Bilateral primary osteoarthritis of knee: Secondary | ICD-10-CM | POA: Diagnosis not present

## 2015-03-12 DIAGNOSIS — Z87891 Personal history of nicotine dependence: Secondary | ICD-10-CM | POA: Insufficient documentation

## 2015-03-12 DIAGNOSIS — D649 Anemia, unspecified: Secondary | ICD-10-CM

## 2015-03-12 DIAGNOSIS — R51 Headache: Secondary | ICD-10-CM | POA: Diagnosis not present

## 2015-03-12 DIAGNOSIS — E669 Obesity, unspecified: Secondary | ICD-10-CM | POA: Insufficient documentation

## 2015-03-12 DIAGNOSIS — R519 Headache, unspecified: Secondary | ICD-10-CM

## 2015-03-12 DIAGNOSIS — K625 Hemorrhage of anus and rectum: Secondary | ICD-10-CM | POA: Diagnosis present

## 2015-03-12 LAB — COMPREHENSIVE METABOLIC PANEL
ALK PHOS: 76 U/L (ref 38–126)
ALT: 10 U/L — ABNORMAL LOW (ref 14–54)
AST: 14 U/L — ABNORMAL LOW (ref 15–41)
Albumin: 3.6 g/dL (ref 3.5–5.0)
Anion gap: 8 (ref 5–15)
BUN: 11 mg/dL (ref 6–20)
CO2: 22 mmol/L (ref 22–32)
Calcium: 8.9 mg/dL (ref 8.9–10.3)
Chloride: 109 mmol/L (ref 101–111)
Creatinine, Ser: 0.71 mg/dL (ref 0.44–1.00)
GFR calc non Af Amer: >60 mL/min (ref >60)
GLUCOSE: 90 mg/dL (ref 65–99)
Potassium: 4.1 mmol/L (ref 3.5–5.1)
Sodium: 139 mmol/L (ref 135–145)
Total Bilirubin: 0.3 mg/dL (ref 0.3–1.2)
Total Protein: 7.5 g/dL (ref 6.5–8.1)

## 2015-03-12 LAB — CBC WITH DIFFERENTIAL/PLATELET
Basophils Absolute: 0 10*3/uL (ref 0.0–0.1)
Basophils Relative: 0 % (ref 0–1)
EOS PCT: 3 % (ref 0–5)
Eosinophils Absolute: 0.4 10*3/uL (ref 0.0–0.7)
HCT: 30.9 % — ABNORMAL LOW (ref 36.0–46.0)
Hemoglobin: 8.9 g/dL — ABNORMAL LOW (ref 12.0–15.0)
LYMPHS ABS: 3.9 10*3/uL (ref 0.7–4.0)
LYMPHS PCT: 27 % (ref 12–46)
MCH: 21.6 pg — AB (ref 26.0–34.0)
MCHC: 28.8 g/dL — ABNORMAL LOW (ref 30.0–36.0)
MCV: 75 fL — AB (ref 78.0–100.0)
MONOS PCT: 3 % (ref 3–12)
Monocytes Absolute: 0.5 10*3/uL (ref 0.1–1.0)
Neutro Abs: 9.9 10*3/uL — ABNORMAL HIGH (ref 1.7–7.7)
Neutrophils Relative %: 67 % (ref 43–77)
PLATELETS: 399 10*3/uL (ref 150–400)
RBC: 4.12 MIL/uL (ref 3.87–5.11)
RDW: 18.2 % — ABNORMAL HIGH (ref 11.5–15.5)
WBC: 14.6 10*3/uL — AB (ref 4.0–10.5)

## 2015-03-12 LAB — ABO/RH: ABO/RH(D): O POS

## 2015-03-12 LAB — TYPE AND SCREEN
ABO/RH(D): O POS
ANTIBODY SCREEN: NEGATIVE

## 2015-03-12 LAB — PROTIME-INR
INR: 1.09 (ref 0.00–1.49)
PROTHROMBIN TIME: 14.3 s (ref 11.6–15.2)

## 2015-03-12 MED ORDER — SODIUM CHLORIDE 0.9 % IV BOLUS (SEPSIS)
1000.0000 mL | Freq: Once | INTRAVENOUS | Status: AC
Start: 2015-03-12 — End: 2015-03-12
  Administered 2015-03-12: 1000 mL via INTRAVENOUS

## 2015-03-12 MED ORDER — SODIUM CHLORIDE 0.9 % IV BOLUS (SEPSIS)
1000.0000 mL | Freq: Once | INTRAVENOUS | Status: AC
  Administered 2015-03-12: 1000 mL via INTRAVENOUS

## 2015-03-12 MED ORDER — DEXAMETHASONE SODIUM PHOSPHATE 10 MG/ML IJ SOLN
10.0000 mg | Freq: Once | INTRAMUSCULAR | Status: AC
  Administered 2015-03-12: 10 mg via INTRAVENOUS
  Filled 2015-03-12: qty 1

## 2015-03-12 MED ORDER — HYDROMORPHONE HCL 1 MG/ML IJ SOLN
0.5000 mg | Freq: Once | INTRAMUSCULAR | Status: AC
  Administered 2015-03-12: 0.5 mg via INTRAVENOUS
  Filled 2015-03-12: qty 1

## 2015-03-12 MED ORDER — HYDROCODONE-ACETAMINOPHEN 5-325 MG PO TABS: 1.0000 | ORAL_TABLET | ORAL | Status: DC | PRN

## 2015-03-12 MED ORDER — ONDANSETRON HCL 4 MG/2ML IJ SOLN
4.0000 mg | Freq: Once | INTRAMUSCULAR | Status: AC
  Administered 2015-03-12: 4 mg via INTRAVENOUS
  Filled 2015-03-12: qty 2

## 2015-03-12 MED ORDER — KETOROLAC TROMETHAMINE 30 MG/ML IJ SOLN
30.0000 mg | Freq: Once | INTRAMUSCULAR | Status: AC
  Administered 2015-03-12: 30 mg via INTRAVENOUS
  Filled 2015-03-12: qty 1

## 2015-03-12 NOTE — ED Notes (Signed)
Pt has had pain and pressure behind rt eye for 5 days now getting worse. She states that she has had this feeling in the past and it was from low HBG and needed a transfusion from rectal bleeding. Pt has chronic colitis and bleeding has increased. Denies any n/v/d. No slurred speech, facial droop. Pt did state that she has had more frequent loss of balance with no loc due to the dizziness. Did not fall or hit head. Alert x4. Denies any previous HTN issues.

## 2015-03-12 NOTE — ED Provider Notes (Signed)
CSN: [id number]     Arrival date & time 03/12/15  0910 History   First MD Initiated Contact with Patient 03/12/15 0912     Chief Complaint  Patient presents with  . Headache  . Dizziness  . Rectal Bleeding     (Consider location/radiation/quality/duration/timing/severity/associated sxs/prior Treatment) HPI    PCP: Robyn Haber, MD Blood pressure 152/79, pulse 88, temperature 98.2 F (36.8 C), temperature source Oral, resp. rate 18, height 5' 5"  (1.651 m), weight 320 lb (145.151 kg), SpO2 99 %.  [name] is a 39 y.o.female with a significant PMH of chronic colitis, history of Clostridium difficile, asthma, anal fissures, history of rectal bleeding requiring transfusion in the past presents to the ER with complaints of headache. Her head has been hurting for the past 4 days she describes it as pressure and pulsating sensation on the right side frontal area. She also reports all around feeling weak. No focal weakness. She endorses sensitivity to light. She endorses pain with moving her right eye. She denies any swelling to the right eye, redness, drainage or injury to this eye. She also feels pressure to the right side of her neck that does not worsen with range of motion or palpation. She has been having increased rectal bleeding due to her chronic colitis and is treated by Dr. Carlean Purl at [facility] gastroenterology. She says her symptoms are similar to when she had to get a transfusion due to low hemoglobin level. The patient denies having any symptoms of syncope, nausea and vomiting, abdominal pain, back pain, focal weakness, dysphasia, aphasia.   Past Medical History  Diagnosis Date  . Bronchitis   . Asthma   . Clostridium difficile infection 04/20/2012  . Obesity   . Osteoarthritis     Both knees  . Shortness of breath     on exertion  . Internal and external bleeding hemorrhoids 06/11/2014  . Left sided chronic colitis - segmental 06/11/2014  . TOBACCO USER 10/02/2009     Qualifier: Diagnosis of  By: Dimas Millin MD, Ellard Artis    . Anal fissure - posterior 10/16/2014   Past Surgical History  Procedure Laterality Date  . Hemrrhoid surgery    . Colonoscopy  2007    for rectal bleeding; [facility]  . Laparoscopic tubal ligation  10/16/2011    Procedure: LAPAROSCOPIC TUBAL LIGATION;  Surgeon: Alwyn Pea, MD;  Location: [place] [facility];  Service: Gynecology;  Laterality: Bilateral;  . Foot surgery    . Tubal ligation    . Colonoscopy N/A 06/11/2014    Procedure: COLONOSCOPY;  Surgeon: Gatha Mayer, MD;  Location: [facility];  Service: Endoscopy;  Laterality: N/A;  . Knee arthroscopy Left 09/06/2014   Family History  Problem Relation Age of Onset  . Hypertension Mother   . Diabetes Mother   . Sarcoidosis Mother     lungs and skin  . Asthma Mother   . Hypertension Father   . Diabetes Father   . Asthma Son   . Asthma Sister   . Asthma Brother   . Asthma Daughter     died age 5.5  . Cancer Daughter 4    brain; died age 5.5  . Asthma Son    History  Substance Use Topics  . Smoking status: Former Smoker -- 0.50 packs/day for 20 years    Types: Cigarettes    Quit date: 03/17/2014  . Smokeless tobacco: Never Used  . Alcohol Use: No   OB History    No data available  Review of Systems  10 Systems reviewed and are negative for acute change except as noted in the HPI.    Allergies  Oxycodone; Ciprofloxacin; Doxycycline; and Penicillins  Home Medications   Prior to Admission medications   Medication Sig Start Date End Date Taking? Authorizing Provider  albuterol (PROVENTIL HFA;VENTOLIN HFA) 108 (90 BASE) MCG/ACT inhaler Inhale 2 puffs into the lungs every 6 (six) hours as needed for wheezing or shortness of breath. For shortness of breath   Yes Historical Provider, MD  dicyclomine (BENTYL) 20 MG tablet Take 1 tablet (20 mg total) by mouth 4 (four) times daily -  before meals and at bedtime. Patient taking differently: Take 20 mg by mouth 2 (two) times  daily as needed for spasms.  08/20/14  Yes Gatha Mayer, MD  ibuprofen (ADVIL,MOTRIN) 200 MG tablet Take 400 mg by mouth every 6 (six) hours as needed for headache.   Yes Historical Provider, MD  ipratropium (ATROVENT) 0.03 % nasal spray Place 2 sprays into both nostrils 2 (two) times daily. Patient taking differently: Place 2 sprays into both nostrils 2 (two) times daily as needed for rhinitis.  09/04/14  Yes Nicole Bush V, PA-C  Iron-DSS-B12-FA-C-E-Cu-Biotin (HEMAX) 150-1 MG TABS Take 1 tablet by mouth 2 (two) times daily. 08/20/14  Yes Gatha Mayer, MD  mesalamine (LIALDA) 1.2 G EC tablet Take 2 tablets (2.4 g total) by mouth 2 (two) times daily. Patient taking differently: Take 1.2 g by mouth 2 (two) times daily.  08/20/14  Yes Gatha Mayer, MD  diltiazem 2 % GEL Apply 1 application topically 3 (three) times daily. Patient not taking: Reported on 03/12/2015 10/16/14   Gatha Mayer, MD  HYDROcodone-acetaminophen (NORCO/VICODIN) 5-325 MG per tablet Take 1-2 tablets by mouth every 4 (four) hours as needed. 03/12/15   Keatin Benham Carlota Raspberry, PA-C  mesalamine (CANASA) 1000 MG suppository Place 1 suppository (1,000 mg total) rectally at bedtime. Patient not taking: Reported on 03/12/2015 10/16/14   Gatha Mayer, MD   BP 148/80 mmHg  Pulse 88  Temp(Src) 98.2 F (36.8 C) (Oral)  Resp 18  Ht 5' 5"  (1.651 m)  Wt 320 lb (145.151 kg)  BMI 53.25 kg/m2  SpO2 99% Physical Exam  Constitutional: She appears well-developed and well-nourished. No distress.  HENT:  Head: Normocephalic and atraumatic. Head is without raccoon's eyes, without Battle's sign, without abrasion, without contusion, without right periorbital erythema and without left periorbital erythema.  Right Ear: Tympanic membrane normal.  Left Ear: Tympanic membrane normal.  Nose: Right sinus exhibits maxillary sinus tenderness and frontal sinus tenderness. Left sinus exhibits no maxillary sinus tenderness and no frontal sinus tenderness.   Mouth/Throat: Uvula is midline and oropharynx is clear and moist.  Eyes: Conjunctivae, EOM and lids are normal. Pupils are equal, round, and reactive to light.  Neck: Normal range of motion. Neck supple.  Cardiovascular: Normal rate and regular rhythm.   Pulmonary/Chest: Effort normal.  Abdominal: Soft.  Neurological: She is alert.  Cranial nerves II-VIII and X-XII evaluated and show no deficits. Pt alert and oriented x 3 Upper and lower extremity strength is symmetrical and physiologic Normal muscular tone No facial droop Coordination intact, No pronator drift   Skin: Skin is warm and dry.  Nursing note and vitals reviewed.   ED Course  Procedures (including critical care time) Labs Review Labs Reviewed  CBC WITH DIFFERENTIAL/PLATELET - Abnormal; Notable for the following:    WBC 14.6 (*)    Hemoglobin 8.9 (*)  HCT 30.9 (*)    MCV 75.0 (*)    MCH 21.6 (*)    MCHC 28.8 (*)    RDW 18.2 (*)    Neutro Abs 9.9 (*)    All other components within normal limits  COMPREHENSIVE METABOLIC PANEL - Abnormal; Notable for the following:    AST 14 (*)    ALT 10 (*)    All other components within normal limits  PROTIME-INR  TYPE AND SCREEN  ABO/RH    Imaging Review Ct Head Wo Contrast  03/12/2015   CLINICAL DATA:  Right side headache for 4 days, blurred vision, right facial droop  EXAM: CT HEAD WITHOUT CONTRAST  TECHNIQUE: Contiguous axial images were obtained from the base of the skull through the vertex without intravenous contrast.  COMPARISON:  02/11/2011  FINDINGS: No skull fracture is noted. Paranasal sinuses and mastoid air cells are unremarkable. No intracranial hemorrhage, mass effect or midline shift. No acute cortical infarction. No mass lesion is noted on this unenhanced scan. No hydrocephalus. Ventricular size is stable from prior exam. No intra or extra-axial fluid collection.  IMPRESSION: No acute intracranial abnormality.  No significant change.   Electronically Signed    By: Lahoma Crocker M.D.   On: 03/12/2015 10:57     EKG Interpretation None      MDM   Final diagnoses:  Headache  Rectal bleeding  Anemia, unspecified anemia type    Patients symptoms are consistent with migraine headache. Presentation is non concerning for [facility], ICH, Meningitis, or temporal arteritis. Pt is afebrile with no focal neuro deficits, nuchal rigidity, or change in vision. The patient denies any symptoms of neurological impairment or TIA's; no amaurosis, diplopia, dysphasia, or unilateral disturbance of motor or sensory function. No loss of balance or vertigo.  Her hemoglobin has dropped to 8.9, I discussed her rectal bleeding with Dr. Marla Roe mid-level who agreed that 8.9 does not warrant transfusion. She will be seen in the office tomorrow at 1:15 pm to further manage the colitis.   The headache resolved significantly with medications. She had no episodes of syncope, rectal bleeding, vomiting, or diarrhea while in the ED for the multiple hours she was here. She is comfortable with discharged and agrees to attend tomorrows schedule appointment.  Medications  sodium chloride 0.9 % bolus 1,000 mL (0 mLs Intravenous Stopped 03/12/15 1329)  ondansetron (ZOFRAN) injection 4 mg (4 mg Intravenous Given 03/12/15 1134)  dexamethasone (DECADRON) injection 10 mg (10 mg Intravenous Given 03/12/15 1206)  HYDROmorphone (DILAUDID) injection 0.5 mg (0.5 mg Intravenous Given 03/12/15 1206)  ketorolac (TORADOL) 30 MG/ML injection 30 mg (30 mg Intravenous Given 03/12/15 1329)  sodium chloride 0.9 % bolus 1,000 mL (0 mLs Intravenous Stopped 03/12/15 1409)    39 y.o.Jazzmyne Rasnick PYYFR'T evaluation in the Emergency Department is complete. It has been determined that no acute conditions requiring further emergency intervention are present at this time. The patient/guardian have been advised of the diagnosis and plan. We have discussed signs and symptoms that warrant return to the ED, such as changes or  worsening in symptoms.  Vital signs are stable at discharge. Filed Vitals:   03/12/15 1209  BP: 148/80  Pulse: 88  Temp:   Resp:     Patient/guardian has voiced understanding and agreed to follow-up with the PCP or specialist.     Delos Haring, PA-C 03/12/15 [place], MD 03/13/15 1723

## 2015-03-12 NOTE — Discharge Instructions (Signed)

## 2015-03-12 NOTE — ED Notes (Signed)
Patient transported to CT 

## 2015-03-13 ENCOUNTER — Ambulatory Visit (INDEPENDENT_AMBULATORY_CARE_PROVIDER_SITE_OTHER): Payer: 59 | Admitting: Physician Assistant

## 2015-03-13 ENCOUNTER — Encounter (HOSPITAL_COMMUNITY)
Admission: RE | Admit: 2015-03-13 | Discharge: 2015-03-13 | Disposition: A | Discharge status: Home / Self Care | Payer: 59 | Source: Ambulatory Visit | Attending: Internal Medicine | Admitting: Internal Medicine

## 2015-03-13 ENCOUNTER — Encounter: Payer: Self-pay | Admitting: Physician Assistant

## 2015-03-13 VITALS — BP 130/76 | HR 88 | Ht 65.0 in | Wt 323.0 lb

## 2015-03-13 DIAGNOSIS — K51911 Ulcerative colitis, unspecified with rectal bleeding: Secondary | ICD-10-CM | POA: Diagnosis not present

## 2015-03-13 DIAGNOSIS — D509 Iron deficiency anemia, unspecified: Secondary | ICD-10-CM | POA: Diagnosis not present

## 2015-03-13 LAB — CBC
HCT: 30.1 % — ABNORMAL LOW (ref 36.0–46.0)
Hemoglobin: 9 g/dL — ABNORMAL LOW (ref 12.0–15.0)
MCH: 21.7 pg — AB (ref 26.0–34.0)
MCHC: 29.9 g/dL — ABNORMAL LOW (ref 30.0–36.0)
MCV: 72.7 fL — ABNORMAL LOW (ref 78.0–100.0)
PLATELETS: 493 10*3/uL — AB (ref 150–400)
RBC: 4.14 MIL/uL (ref 3.87–5.11)
RDW: 18.3 % — ABNORMAL HIGH (ref 11.5–15.5)
WBC: 21.7 10*3/uL — ABNORMAL HIGH (ref 4.0–10.5)

## 2015-03-13 LAB — PREPARE RBC (CROSSMATCH)

## 2015-03-13 MED ORDER — MESALAMINE 1.2 G PO TBEC: 4.8000 g | DELAYED_RELEASE_TABLET | Freq: Every day | ORAL | Status: DC

## 2015-03-13 MED ORDER — MESALAMINE 1000 MG RE SUPP: 1000.0000 mg | Freq: Every day | RECTAL | Status: DC

## 2015-03-13 NOTE — Patient Instructions (Addendum)
Your physician has requested that you go to the basement for  lab work in 2 weeks. We have sent medications to your pharmacy for you to pick up at your convenience. Please go to [facility] now for your blood test. Entrance A.  Follow up with Dr Carlean Purl on 03/26/15 at 9:45 am.

## 2015-03-13 NOTE — Progress Notes (Signed)
Patient ID: [name], female   DOB: [birth year], 39 y.o.   MRN: [id number]     History of Present Illness: JENNI THEW known to Dr. Carlean Purl from prior rectal bleeding and left-sided colitis. He was last seen here in January 2006 with left-sided UC and hemorrhoids. She was given a trial of Lialda 2 capsules daily along with Canasa suppositories for 9 days. She also had an anal fissure at the time and was prescribed diltiazem gel. She initially did well but for the past several months has had bleeding that has become more frequent. She has crampy left-sided abdominal pain. She skips a day between bowel movements and then will either have diarrhea or a hard stool that she strains to pass. She has been having bright red blood dripping in the commode or mixed with the stool with every bowel movement for the past month. She has some mucus with the stool. She has no tenesmus. She has no fever, chills, night sweats, nausea, or vomiting. As been having headaches for which she has been using ibuprofen she says she knows it will flare her colitis but Tylenol wasn't working. He was seen in the emergency room at [facility] 2 days ago with complaints of headache weakness. She says her symptoms are similar from when she had to get a transfusion in the past. She states if she gets up quickly she feels dizzy and she has some shortness of breath with exertion though no chest pain. She has had a uterine ablation due to menorrhagia 2 years ago but states she has been getting some vaginal spotting. She has not used [employer] and overall weak. She states when she bled she thought the Lorette Ang was causing the bleeding so she did not necessarily use it regularly as prescribed. She has not used her diltiazem in 2 months.   Past Medical History  Diagnosis Date  . Bronchitis   . Asthma   . Clostridium difficile infection 04/20/2012  . Obesity   . Osteoarthritis     Both knees  . Shortness of breath     on  exertion  . Internal and external bleeding hemorrhoids 06/11/2014  . Left sided chronic colitis - segmental 06/11/2014  . TOBACCO USER 10/02/2009    Qualifier: Diagnosis of  By: Dimas Millin MD, Ellard Artis    . Anal fissure - posterior 10/16/2014    Past Surgical History  Procedure Laterality Date  . Hemrrhoid surgery    . Colonoscopy  2007    for rectal bleeding; [facility]  . Laparoscopic tubal ligation  10/16/2011    Procedure: LAPAROSCOPIC TUBAL LIGATION;  Surgeon: Alwyn Pea, MD;  Location: [place] [facility];  Service: Gynecology;  Laterality: Bilateral;  . Foot surgery    . Tubal ligation    . Colonoscopy N/A 06/11/2014    Procedure: COLONOSCOPY;  Surgeon: Gatha Mayer, MD;  Location: [facility];  Service: Endoscopy;  Laterality: N/A;  . Knee arthroscopy Left 09/06/2014   Family History  Problem Relation Age of Onset  . Hypertension Mother   . Diabetes Mother   . Sarcoidosis Mother     lungs and skin  . Asthma Mother   . Hypertension Father   . Diabetes Father   . Asthma Son   . Asthma Sister   . Asthma Brother   . Asthma Daughter     died age 74.5  . Cancer Daughter 4    brain; died age 74.5  . Asthma Son  History  Substance Use Topics  . Smoking status: Former Smoker -- 0.50 packs/day for 20 years    Types: Cigarettes    Quit date: 03/17/2014  . Smokeless tobacco: Never Used  . Alcohol Use: No   Current Outpatient Prescriptions  Medication Sig Dispense Refill  . albuterol (PROVENTIL HFA;VENTOLIN HFA) 108 (90 BASE) MCG/ACT inhaler Inhale 2 puffs into the lungs every 6 (six) hours as needed for wheezing or shortness of breath. For shortness of breath    . dicyclomine (BENTYL) 20 MG tablet Take 1 tablet (20 mg total) by mouth 4 (four) times daily -  before meals and at bedtime. (Patient taking differently: Take 20 mg by mouth 2 (two) times daily as needed for spasms. ) 120 tablet 3  . HYDROcodone-acetaminophen (NORCO/VICODIN) 5-325 MG per tablet Take 1-2 tablets by mouth every 4  (four) hours as needed. 12 tablet 0  . ibuprofen (ADVIL,MOTRIN) 200 MG tablet Take 400 mg by mouth every 6 (six) hours as needed for headache.    . ipratropium (ATROVENT) 0.03 % nasal spray Place 2 sprays into both nostrils 2 (two) times daily. (Patient taking differently: Place 2 sprays into both nostrils 2 (two) times daily as needed for rhinitis. ) 30 mL 0  . Iron-DSS-B12-FA-C-E-Cu-Biotin (HEMAX) 150-1 MG TABS Take 1 tablet by mouth 2 (two) times daily. 60 tablet 2  . mesalamine (CANASA) 1000 MG suppository Place 1 suppository (1,000 mg total) rectally at bedtime. 9 suppository 0  . mesalamine (LIALDA) 1.2 G EC tablet Take 2 tablets (2.4 g total) by mouth 2 (two) times daily. (Patient taking differently: Take 1.2 g by mouth 2 (two) times daily. ) 120 tablet 3   No current facility-administered medications for this visit.   Allergies  Allergen Reactions  . Oxycodone Anaphylaxis  . Ciprofloxacin Itching and Rash  . Doxycycline Nausea And Vomiting  . Penicillins Rash      Review of Systems:   LAB RESULTS:  Recent Labs  03/12/15 0956  WBC 14.6*  HGB 8.9*  HCT 30.9*  PLT 399   BMET  Recent Labs  03/12/15 0956  NA 139  K 4.1  CL 109  CO2 22  GLUCOSE 90  BUN 11  CREATININE 0.71  CALCIUM 8.9   LFT  Recent Labs  03/12/15 0956  PROT 7.5  ALBUMIN 3.6  AST 14*  ALT 10*  ALKPHOS 76  BILITOT 0.3   PT/INR  Recent Labs  03/12/15 0956  LABPROT 14.3  INR 1.09      Studies:   Ct Head Wo Contrast  03/12/2015   CLINICAL DATA:  Right side headache for 4 days, blurred vision, right facial droop  EXAM: CT HEAD WITHOUT CONTRAST  TECHNIQUE: Contiguous axial images were obtained from the base of the skull through the vertex without intravenous contrast.  COMPARISON:  02/11/2011  FINDINGS: No skull fracture is noted. Paranasal sinuses and mastoid air cells are unremarkable. No intracranial hemorrhage, mass effect or midline shift. No acute cortical infarction. No mass  lesion is noted on this unenhanced scan. No hydrocephalus. Ventricular size is stable from prior exam. No intra or extra-axial fluid collection.  IMPRESSION: No acute intracranial abnormality.  No significant change.   Electronically Signed   By: Lahoma Crocker M.D.   On: 03/12/2015 10:57     Physical Exam: General: Pleasant, obese, African-American female in no acute distress Head: Normocephalic and atraumatic Eyes:  sclerae anicteric, conjunctiva pink  Ears: Normal auditory acuity Lungs: Clear throughout to auscultation  Heart: Regular rate and rhythm Abdomen: Soft, non distended, mild left lower quadrant tenderness to palpation No masses, no hepatomegaly. Normal bowel sounds Rectal: Small external hemorrhoid, nonbleeding noted. Digital rectal exam with mucus that is blood streaked and heme-positive. Musculoskeletal: Symmetrical with no gross deformities  Extremities: No edema  Neurological: Alert oriented x 4, grossly nonfocal Psychological:  Alert and cooperative. Normal mood and affect  Assessment and Recommendations: 39 year old female with a history of UC and hemorrhoids presenting with a 2 month history of rectal bleeding that has become more progressive now associated with some weakness and fatigue. Agent has been unable to tolerate oral iron in the past. Will obtain repeat CBC today and schedule for transfusion of 2 units packed cells later this week. She will start Lialda 4 tablets by mouth every morning along with Canasa suppositories thousand milligrams 1 per rectum daily at bedtime 3 weeks. She's been instructed to avoid ibuprofen and all non-steroidal anti-inflammatory drugs. She will call us if she is not noting any improvement at all in one to one and a half weeks. Follow-up will be scheduled in 3 weeks, sooner if needed.        Romie Tay, Deloris Ping 03/13/2015,

## 2015-03-14 ENCOUNTER — Other Ambulatory Visit: Payer: Self-pay

## 2015-03-14 ENCOUNTER — Encounter (HOSPITAL_COMMUNITY)
Admission: RE | Admit: 2015-03-14 | Discharge: 2015-03-14 | Disposition: A | Discharge status: Home / Self Care | Payer: 59 | Source: Ambulatory Visit | Attending: Internal Medicine | Admitting: Internal Medicine

## 2015-03-14 VITALS — BP 125/64 | HR 96 | Temp 98.6°F | Resp 20

## 2015-03-14 DIAGNOSIS — D509 Iron deficiency anemia, unspecified: Secondary | ICD-10-CM | POA: Diagnosis not present

## 2015-03-14 MED ORDER — METHYLPREDNISOLONE SODIUM SUCC 125 MG IJ SOLR
INTRAMUSCULAR | Status: AC
Start: 2015-03-14 — End: 2015-03-14
  Administered 2015-03-14: 125 mg via INTRAVENOUS
  Filled 2015-03-14: qty 2

## 2015-03-14 MED ORDER — DIPHENHYDRAMINE HCL 50 MG/ML IJ SOLN
50.0000 mg | Freq: Once | INTRAMUSCULAR | Status: AC
  Administered 2015-03-14: 50 mg via INTRAVENOUS

## 2015-03-14 MED ORDER — PREDNISONE 10 MG PO TABS: ORAL_TABLET | ORAL | Status: DC

## 2015-03-14 MED ORDER — DIPHENHYDRAMINE HCL 50 MG/ML IJ SOLN
INTRAMUSCULAR | Status: AC
  Administered 2015-03-14: 50 mg via INTRAVENOUS
  Filled 2015-03-14: qty 1

## 2015-03-14 MED ORDER — SODIUM CHLORIDE 0.9 % IV SOLN: Freq: Once | INTRAVENOUS | Status: DC

## 2015-03-14 MED ORDER — METHYLPREDNISOLONE SODIUM SUCC 125 MG IJ SOLR
125.0000 mg | Freq: Once | INTRAMUSCULAR | Status: AC
  Administered 2015-03-14: 125 mg via INTRAVENOUS

## 2015-03-14 MED ORDER — SODIUM CHLORIDE 0.9 % IV SOLN
Freq: Once | INTRAVENOUS | Status: AC
  Administered 2015-03-14: 09:00:00 via INTRAVENOUS

## 2015-03-14 NOTE — Progress Notes (Signed)
Quick Note:  Would start prednisone  Would do 40 x 5 d 30 x 5 d then 20 a day til she sees me 6/28 Consider seeing PCP about other sxs Hgb not really low enough to transfuse but if set up prob ok x 1 -2 U - her PCP did that before  ______

## 2015-03-14 NOTE — Progress Notes (Signed)
Started Blood transfusion and after 4-5 minutes of the blood being started the patient complained of itching all over.  We stopped the blood, started Normal saline, and called and spoke with Cecille Rubin Hvozdovic, PA as well as the blood bank here to report the above.  Orders given to give IV benadryl and IV solumedrol and try to resume the blood.  Will continue to monitor patient closely.

## 2015-03-14 NOTE — Progress Notes (Signed)
In order to chart on both units of blood the order had to be modified which then discontinued the original order and it appears as a duplicate because [employer] would not allow [place] to edit the original charting.

## 2015-03-14 NOTE — Progress Notes (Signed)
0915 restarted blood, 0930 pt states the itching is better.  Will continue with Blood transfusion and will continue to monitor patient closely.

## 2015-03-15 LAB — TYPE AND SCREEN
ABO/RH(D): O POS
Antibody Screen: NEGATIVE
Unit division: 0
Unit division: 0

## 2015-03-26 ENCOUNTER — Ambulatory Visit: Payer: 59 | Admitting: Internal Medicine

## 2015-03-27 NOTE — Progress Notes (Signed)
Agree w/ Ms. Hvozdovic's note and mangement.

## 2015-05-18 ENCOUNTER — Other Ambulatory Visit: Payer: Self-pay | Admitting: Family Medicine

## 2015-05-18 ENCOUNTER — Ambulatory Visit (INDEPENDENT_AMBULATORY_CARE_PROVIDER_SITE_OTHER): Payer: 59 | Admitting: Family Medicine

## 2015-05-18 VITALS — BP 114/84 | HR 78 | Temp 99.0°F | Ht 66.5 in | Wt 320.0 lb

## 2015-05-18 DIAGNOSIS — R300 Dysuria: Secondary | ICD-10-CM

## 2015-05-18 DIAGNOSIS — F419 Anxiety disorder, unspecified: Secondary | ICD-10-CM

## 2015-05-18 DIAGNOSIS — Z23 Encounter for immunization: Secondary | ICD-10-CM | POA: Diagnosis not present

## 2015-05-18 DIAGNOSIS — T148XXA Other injury of unspecified body region, initial encounter: Secondary | ICD-10-CM

## 2015-05-18 DIAGNOSIS — B079 Viral wart, unspecified: Secondary | ICD-10-CM | POA: Diagnosis not present

## 2015-05-18 DIAGNOSIS — L989 Disorder of the skin and subcutaneous tissue, unspecified: Secondary | ICD-10-CM | POA: Diagnosis not present

## 2015-05-18 DIAGNOSIS — Z01419 Encounter for gynecological examination (general) (routine) without abnormal findings: Secondary | ICD-10-CM | POA: Diagnosis not present

## 2015-05-18 DIAGNOSIS — F329 Major depressive disorder, single episode, unspecified: Secondary | ICD-10-CM

## 2015-05-18 DIAGNOSIS — T148 Other injury of unspecified body region: Secondary | ICD-10-CM

## 2015-05-18 DIAGNOSIS — Z131 Encounter for screening for diabetes mellitus: Secondary | ICD-10-CM | POA: Diagnosis not present

## 2015-05-18 DIAGNOSIS — Z7251 High risk heterosexual behavior: Secondary | ICD-10-CM

## 2015-05-18 DIAGNOSIS — Z13220 Encounter for screening for lipoid disorders: Secondary | ICD-10-CM | POA: Diagnosis not present

## 2015-05-18 DIAGNOSIS — Z0000 Encounter for general adult medical examination without abnormal findings: Secondary | ICD-10-CM

## 2015-05-18 DIAGNOSIS — K5190 Ulcerative colitis, unspecified, without complications: Secondary | ICD-10-CM

## 2015-05-18 DIAGNOSIS — F32A Depression, unspecified: Secondary | ICD-10-CM

## 2015-05-18 DIAGNOSIS — F418 Other specified anxiety disorders: Secondary | ICD-10-CM | POA: Diagnosis not present

## 2015-05-18 LAB — LIPID PANEL
CHOLESTEROL: 172 mg/dL (ref 125–200)
HDL: 51 mg/dL (ref >=46)
LDL Cholesterol: 105 mg/dL (ref <130)
Total CHOL/HDL Ratio: 3.4 Ratio (ref <=5.0)
Triglycerides: 78 mg/dL (ref <150)
VLDL: 16 mg/dL (ref <30)

## 2015-05-18 LAB — COMPREHENSIVE METABOLIC PANEL
ALT: 9 U/L (ref 6–29)
AST: 10 U/L (ref 10–30)
Albumin: 3.8 g/dL (ref 3.6–5.1)
Alkaline Phosphatase: 78 U/L (ref 33–115)
BILIRUBIN TOTAL: 0.4 mg/dL (ref 0.2–1.2)
BUN: 13 mg/dL (ref 7–25)
CHLORIDE: 107 mmol/L (ref 98–110)
CO2: 22 mmol/L (ref 20–31)
CREATININE: 0.92 mg/dL (ref 0.50–1.10)
Calcium: 9.3 mg/dL (ref 8.6–10.2)
Glucose, Bld: 77 mg/dL (ref 65–99)
Potassium: 4.6 mmol/L (ref 3.5–5.3)
SODIUM: 139 mmol/L (ref 135–146)
TOTAL PROTEIN: 7 g/dL (ref 6.1–8.1)

## 2015-05-18 LAB — POCT WET PREP WITH KOH
Clue Cells Wet Prep HPF POC: NEGATIVE
KOH Prep POC: NEGATIVE
RBC Wet Prep HPF POC: NEGATIVE
TRICHOMONAS UA: NEGATIVE
Yeast Wet Prep HPF POC: NEGATIVE

## 2015-05-18 LAB — CBC WITH DIFFERENTIAL/PLATELET
BASOS PCT: 0 % (ref 0–1)
Basophils Absolute: 0 10*3/uL (ref 0.0–0.1)
Eosinophils Absolute: 0.4 10*3/uL (ref 0.0–0.7)
Eosinophils Relative: 3 % (ref 0–5)
HCT: 33.6 % — ABNORMAL LOW (ref 36.0–46.0)
HEMOGLOBIN: 10.7 g/dL — AB (ref 12.0–15.0)
LYMPHS PCT: 29 % (ref 12–46)
Lymphs Abs: 3.7 10*3/uL (ref 0.7–4.0)
MCH: 25.2 pg — AB (ref 26.0–34.0)
MCHC: 31.8 g/dL (ref 30.0–36.0)
MCV: 79.2 fL (ref 78.0–100.0)
MONO ABS: 0.5 10*3/uL (ref 0.1–1.0)
MONOS PCT: 4 % (ref 3–12)
MPV: 10.5 fL (ref 8.6–12.4)
NEUTROS ABS: 8.1 10*3/uL — AB (ref 1.7–7.7)
Neutrophils Relative %: 64 % (ref 43–77)
Platelets: 419 10*3/uL — ABNORMAL HIGH (ref 150–400)
RBC: 4.24 MIL/uL (ref 3.87–5.11)
RDW: 21.1 % — ABNORMAL HIGH (ref 11.5–15.5)
WBC: 12.7 10*3/uL — ABNORMAL HIGH (ref 4.0–10.5)

## 2015-05-18 LAB — HEMOGLOBIN A1C
HEMOGLOBIN A1C: 5.8 % — AB (ref <5.7)
Mean Plasma Glucose: 120 mg/dL — ABNORMAL HIGH (ref <117)

## 2015-05-18 LAB — POCT URINALYSIS DIPSTICK
BILIRUBIN UA: NEGATIVE
GLUCOSE UA: NEGATIVE
KETONES UA: NEGATIVE
LEUKOCYTES UA: NEGATIVE
Nitrite, UA: NEGATIVE
SPEC GRAV UA: 1.025
Urobilinogen, UA: 0.2
pH, UA: 6.5

## 2015-05-18 LAB — RPR

## 2015-05-18 LAB — VITAMIN B12: Vitamin B-12: 662 pg/mL (ref 211–911)

## 2015-05-18 LAB — HIV ANTIBODY (ROUTINE TESTING W REFLEX): HIV 1&2 Ab, 4th Generation: NONREACTIVE

## 2015-05-18 LAB — TSH: TSH: 1.514 u[IU]/mL (ref 0.350–4.500)

## 2015-05-18 MED ORDER — HYDROXYZINE HCL 25 MG PO TABS: 12.5000 mg | ORAL_TABLET | Freq: Three times a day (TID) | ORAL | Status: DC | PRN

## 2015-05-18 NOTE — Patient Instructions (Addendum)
1. [email or web address]  Tdap Vaccine (Tetanus, Diphtheria, Pertussis): What You Need to Know 1. Why get vaccinated? Tetanus, diphtheria and pertussis can be very serious diseases, even for adolescents and adults. Tdap vaccine can protect [place] from these diseases. TETANUS (Lockjaw) causes painful muscle tightening and stiffness, usually all over the body.  It can lead to tightening of muscles in the head and neck so you can't open your mouth, swallow, or sometimes even breathe. Tetanus kills about 1 out of 5 people who are infected. DIPHTHERIA can cause a thick coating to form in the back of the throat. 1. It can lead to breathing problems, paralysis, heart failure, and death. PERTUSSIS (Whooping Cough) causes severe coughing spells, which can cause difficulty breathing, vomiting and disturbed sleep. 1. It can also lead to weight loss, incontinence, and rib fractures. Up to 2 in 100 adolescents and 5 in 100 adults with pertussis are hospitalized or have complications, which could include pneumonia or death. These diseases are caused by bacteria. Diphtheria and pertussis are spread from person to person through coughing or sneezing. Tetanus enters the body through cuts, scratches, or wounds. Before vaccines, the [place] saw as many as 200,000 cases a year of diphtheria and pertussis, and hundreds of cases of tetanus. Since vaccination began, tetanus and diphtheria have dropped by about 99% and pertussis by about 80%. 2. Tdap vaccine Tdap vaccine can protect adolescents and adults from tetanus, diphtheria, and pertussis. One dose of Tdap is routinely given at age 74 or 47. People who did not get Tdap at that age should get it as soon as possible. Tdap is especially important for health care professionals and anyone having close contact with a baby younger than 12 months. Pregnant women should get a dose of Tdap during every pregnancy, to protect the newborn from pertussis. Infants are most at risk for  severe, life-threatening complications from pertussis. A similar vaccine, called Td, protects from tetanus and diphtheria, but not pertussis. A Td booster should be given every 10 years. Tdap may be given as one of these boosters if you have not already gotten a dose. Tdap may also be given after a severe cut or burn to prevent tetanus infection. Your doctor can give you more information. Tdap may safely be given at the same time as other vaccines. 3. Some people should not get this vaccine 1. If you ever had a life-threatening allergic reaction after a dose of any tetanus, diphtheria, or pertussis containing vaccine, OR if you have a severe allergy to any part of this vaccine, you should not get Tdap. Tell your doctor if you have any severe allergies. 2. If you had a coma, or long or multiple seizures within 7 days after a childhood dose of DTP or DTaP, you should not get Tdap, unless a cause other than the vaccine was found. You can still get Td. 3. Talk to your doctor if you: 1. have epilepsy or another nervous system problem, 2. had severe pain or swelling after any vaccine containing diphtheria, tetanus or pertussis, 3. ever had Guillain-Barr Syndrome (GBS), 4. aren't feeling well on the day the shot is scheduled. 4. Risks of a vaccine reaction With any medicine, including vaccines, there is a chance of side effects. These are usually mild and go away on their own, but serious reactions are also possible. Brief fainting spells can follow a vaccination, leading to injuries from falling. Sitting or lying down for about 15 minutes can help prevent these. Tell your doctor  if you feel dizzy or light-headed, or have vision changes or ringing in the ears. Mild problems following Tdap (Did not interfere with activities)  Pain where the shot was given (about 3 in 4 adolescents or 2 in 3 adults)  Redness or swelling where the shot was given (about 1 person in 5)  Mild fever of at least 100.20F (up  to about 1 in 25 adolescents or 1 in 100 adults)  Headache (about 3 or 4 people in 10)  Tiredness (about 1 person in 3 or 4)  Nausea, vomiting, diarrhea, stomach ache (up to 1 in 4 adolescents or 1 in 10 adults)  Chills, body aches, sore joints, rash, swollen glands (uncommon) Moderate problems following Tdap (Interfered with activities, but did not require medical attention) 1. Pain where the shot was given (about 1 in 5 adolescents or 1 in 100 adults) 2. Redness or swelling where the shot was given (up to about 1 in 16 adolescents or 1 in 25 adults) 3. Fever over 102F (about 1 in 100 adolescents or 1 in 250 adults) 4. Headache (about 3 in 20 adolescents or 1 in 10 adults) 5. Nausea, vomiting, diarrhea, stomach ache (up to 1 or 3 people in 100) 6. Swelling of the entire arm where the shot was given (up to about 3 in 100). Severe problems following Tdap (Unable to perform usual activities; required medical attention)  Swelling, severe pain, bleeding and redness in the arm where the shot was given (rare). A severe allergic reaction could occur after any vaccine (estimated less than 1 in a million doses). 5. What if there is a serious reaction? What should I look for?  Look for anything that concerns you, such as signs of a severe allergic reaction, very high fever, or behavior changes. Signs of a severe allergic reaction can include hives, swelling of the face and throat, difficulty breathing, a fast heartbeat, dizziness, and weakness. These would start a few minutes to a few hours after the vaccination. What should I do?  If you think it is a severe allergic reaction or other emergency that can't wait, call 9-1-1 or get the person to the nearest hospital. Otherwise, call your doctor.  Afterward, the reaction should be reported to the "Vaccine Adverse Event Reporting System" (VAERS). Your doctor might file this report, or you can do it yourself through the VAERS web site at  www.vaers.SamedayNews.es, or by calling 2025469750. VAERS is only for reporting reactions. They do not give medical advice.  6. The National Vaccine Injury Compensation Program The [employer] Vaccine Injury Compensation Program (VICP) is a federal program that was created to compensate people who may have been injured by certain vaccines. Persons who believe they may have been injured by a vaccine can learn about the program and about filing a claim by calling 4100164657 or visiting the [place] website at GoldCloset.com.ee. 7. How can I learn more?  Ask your doctor.  Call your local or state health department.  Contact the Centers for Disease Control and Prevention (CDC):  Call 5714396903 or visit CDC's website at http://hunter.com/. CDC Tdap Vaccine VIS (02/04/12) Document Released: 03/15/2012 Document Revised: 01/29/2014 Document Reviewed: 12/27/2013 ExitCare Patient Information 2015 [place], [place]. This information is not intended to replace advice given to you by your health care provider. Make sure you discuss any questions you have with your health care provider.    Keeping You Healthy  Get These Tests 2. Blood Pressure- Have your blood pressure checked once a year by  your health care provider.  Normal blood pressure is 120/80. 3. Weight- Have your body mass index (BMI) calculated to screen for obesity.  BMI is measure of body fat based on height and weight.  You can also calculate your own BMI at GravelBags.it. 4. Cholesterol- Have your cholesterol checked every 5 years starting at age 29 then yearly starting at age 61. 25. Chlamydia, HIV, and other sexually transmitted diseases- Get screened every year until age 24, then within three months of each new sexual provider. 6. Pap Test - Every 1-5 years; discuss with your health care provider. 7. Mammogram- Every 1-2 years starting at age 71--50  Take these medicines  Calcium with Vitamin D-Your body  needs 1200 mg of Calcium each day and [phone] IU of Vitamin D daily.  Your body can only absorb 500 mg of Calcium at a time so Calcium must be taken in 2 or 3 divided doses throughout the day.  Multivitamin with folic acid- Once daily if it is possible for you to become pregnant.  Get these Immunizations  Gardasil-Series of three doses; prevents HPV related illness such as genital warts and cervical cancer.  Menactra-Single dose; prevents meningitis.  Tetanus shot- Every 10 years.  Flu shot-Every year.  Take these steps 7. Do not smoke-Your healthcare provider can help you quit.  For tips on how to quit go to www.smokefree.gov or call 1-800 QUITNOW. 8. Be physically active- Exercise 5 days a week for at least 30 minutes.  If you are not already physically active, start slow and gradually work up to 30 minutes of moderate physical activity.  Examples of moderate activity include walking briskly, dancing, swimming, bicycling, etc. 9. Breast Cancer- A self breast exam every month is important for early detection of breast cancer.  For more information and instruction on self breast exams, ask your healthcare provider or https://www.patel.info/. 10. Eat a healthy diet- Eat a variety of healthy foods such as fruits, vegetables, whole grains, low fat milk, low fat cheeses, yogurt, lean meats, poultry and fish, beans, nuts, tofu, etc.  For more information go to www. Thenutritionsource.org 11. Drink alcohol in moderation- Limit alcohol intake to one drink or less per day. Never drink and drive. 12. Depression- Your emotional health is as important as your physical health.  If you're feeling down or losing interest in things you normally enjoy please talk to your healthcare provider about being screened for depression. 13. Dental visit- Brush and floss your teeth twice daily; visit your dentist twice a year. 14. Eye doctor- Get an eye exam at least every 2 years. 15. Helmet use-  Always wear a helmet when riding a bicycle, motorcycle, rollerblading or skateboarding. 16. Safe sex- If you may be exposed to sexually transmitted infections, use a condom. 17. Seat belts- Seat belts can save your live; always wear one. 18. Smoke/Carbon Monoxide detectors- These detectors need to be installed on the appropriate level of your home. Replace batteries at least once a year. 19. Skin cancer- When out in the sun please cover up and use sunscreen 15 SPF or higher. 20. Violence- If anyone is threatening or hurting you, please tell your healthcare provider.

## 2015-05-18 NOTE — Progress Notes (Addendum)
Subjective:    Patient ID: [name], female    DOB: 1975-10-24, 39 y.o.   MRN: [id number]  05/18/2015  Annual Exam   HPI This 39 y.o. female presents for Complete Physical Examination and presents with multiple concerns.  Last physical:  Pre-employment 09-2013 Pap smear:  Novasure in 2013; pap smear 2012; very minimal light bleeding.   Mammogram: never Colonoscopy: 05/2014 TDAP:  2004 Pneumovax:  never Influenza:  2015 Eye exam:  +glasses; last year 2015. Dental exam:  2016   Dysuria: onset in past week; +dysuria; +urgency; +nocturia x 3.  No hematuria. No fever/chills/sweats. No flank pain.  +vaginal discharge intermittently.  Recent new sexual encounter in past month.  Anxiety and depression: onset at 8th grade; more bad days than good; mostly suffers with anxiety but cries a lot for no reason; multiple stressors; no SI/HI. Previous trial of antidepressant in the past without good results; undergoes therapy with two sons regularly.  Skin tag vaginal area: requesting removal; very irritating.   Mouth irritation: onset in past month; upper gum area with bump.  Not improving.  Ulcerative colitis: non-compliant with medications due to nausea.    Bruising easily: onset several months ago; orthopedist recommended discussing with PCP.  Obesity: very interested in weight loss.  Desires to undergo bariatric surgery in upcoming year; realizes that weight is a barrier to health.     Review of Systems  Constitutional: Negative for fever, chills, diaphoresis, activity change, appetite change, fatigue and unexpected weight change.  HENT: Negative for congestion, dental problem, drooling, ear discharge, ear pain, facial swelling, hearing loss, mouth sores, nosebleeds, postnasal drip, rhinorrhea, sinus pressure, sneezing, sore throat, tinnitus, trouble swallowing and voice change.   Eyes: Negative for photophobia, pain, discharge, redness, itching and visual disturbance.    Respiratory: Negative for apnea, cough, choking, chest tightness, shortness of breath, wheezing and stridor.   Cardiovascular: Positive for leg swelling. Negative for chest pain and palpitations.  Gastrointestinal: Positive for abdominal pain, blood in stool, abdominal distention and anal bleeding. Negative for nausea, vomiting, diarrhea and rectal pain.  Endocrine: Positive for polyuria. Negative for cold intolerance, heat intolerance, polydipsia and polyphagia.  Genitourinary: Positive for dysuria, urgency, frequency, genital sores and vaginal pain. Negative for hematuria, flank pain, decreased urine volume, vaginal bleeding, vaginal discharge, enuresis, difficulty urinating, menstrual problem, pelvic pain and dyspareunia.  Musculoskeletal: Positive for myalgias, back pain and arthralgias. Negative for joint swelling, gait problem, neck pain and neck stiffness.  Skin: Negative for color change, pallor, rash and wound.  Allergic/Immunologic: Positive for environmental allergies. Negative for food allergies and immunocompromised state.  Neurological: Positive for headaches. Negative for dizziness, tremors, seizures, syncope, facial asymmetry, speech difficulty, weakness, light-headedness and numbness.  Hematological: Negative for adenopathy. Bruises/bleeds easily.  Psychiatric/Behavioral: Positive for sleep disturbance. Negative for suicidal ideas, hallucinations, behavioral problems, confusion, self-injury, dysphoric mood, decreased concentration and agitation. The patient is not nervous/anxious and is not hyperactive.     Past Medical History  Diagnosis Date  . Bronchitis   . Asthma   . Clostridium difficile infection 04/20/2012  . Obesity   . Osteoarthritis     Both knees  . Shortness of breath     on exertion  . Internal and external bleeding hemorrhoids 06/11/2014  . Left sided chronic colitis - segmental 06/11/2014  . TOBACCO USER 10/02/2009    Qualifier: Diagnosis of  By: Dimas Millin MD, Ellard Artis     . Anal fissure - posterior 10/16/2014  . IDA (iron deficiency  anemia)   . Ulcerative colitis    Past Surgical History  Procedure Laterality Date  . Hemrrhoid surgery    . Colonoscopy  2007    for rectal bleeding; [facility]  . Laparoscopic tubal ligation  10/16/2011    Procedure: LAPAROSCOPIC TUBAL LIGATION;  Surgeon: Alwyn Pea, MD;  Location: [facility];  Service: Gynecology;  Laterality: Bilateral;  . Foot surgery    . Tubal ligation    . Colonoscopy N/A 06/11/2014    Procedure: COLONOSCOPY;  Surgeon: Gatha Mayer, MD;  Location: [facility];  Service: Endoscopy;  Laterality: N/A;  . Knee arthroscopy Left 09/06/2014  . Novasure ablation  09/28/2010    mild persistent vaginal bleeding   Allergies  Allergen Reactions  . Oxycodone Anaphylaxis  . Ciprofloxacin Itching and Rash  . Doxycycline Nausea And Vomiting  . Penicillins Rash   Current Outpatient Prescriptions  Medication Sig Dispense Refill  . albuterol (PROVENTIL HFA;VENTOLIN HFA) 108 (90 BASE) MCG/ACT inhaler Inhale 2 puffs into the lungs every 6 (six) hours as needed for wheezing or shortness of breath. For shortness of breath    . dicyclomine (BENTYL) 20 MG tablet Take 1 tablet (20 mg total) by mouth 4 (four) times daily -  before meals and at bedtime. (Patient taking differently: Take 20 mg by mouth 2 (two) times daily as needed for spasms. ) 120 tablet 3  . ipratropium (ATROVENT) 0.03 % nasal spray Place 2 sprays into both nostrils 2 (two) times daily. (Patient taking differently: Place 2 sprays into both nostrils 2 (two) times daily as needed for rhinitis. ) 30 mL 0  . mesalamine (LIALDA) 1.2 G EC tablet Take 4 tablets (4.8 g total) by mouth daily with breakfast. 120 tablet 3  . HYDROcodone-acetaminophen (NORCO/VICODIN) 5-325 MG per tablet Take 1-2 tablets by mouth every 4 (four) hours as needed. (Patient not taking: Reported on 05/18/2015) 12 tablet 0  . ibuprofen (ADVIL,MOTRIN) 200 MG tablet Take 400 mg by mouth every  6 (six) hours as needed for headache.    . Iron-DSS-B12-FA-C-E-Cu-Biotin (HEMAX) 150-1 MG TABS Take 1 tablet by mouth 2 (two) times daily. (Patient not taking: Reported on 05/18/2015) 60 tablet 2  . mesalamine (CANASA) 1000 MG suppository Place 1 suppository (1,000 mg total) rectally at bedtime. (Patient not taking: Reported on 05/18/2015) 9 suppository 0  . predniSONE (DELTASONE) 10 MG tablet Take 40 mg by mouth for 5 days, 30 mg for 5 days, 20 mg until you see MD (Patient not taking: Reported on 05/18/2015) 100 tablet 0   No current facility-administered medications for this visit.   Social History   Social History  . Marital Status: Single    Spouse Name: N/A  . Number of Children: N/A  . Years of Education: N/A   Occupational History  . Not on file.   Social History Main Topics  . Smoking status: Former Smoker -- 0.50 packs/day for 20 years    Types: Cigarettes    Quit date: 03/17/2014  . Smokeless tobacco: Never Used  . Alcohol Use: No  . Drug Use: No  . Sexual Activity: Yes    Birth Control/ Protection: Condom   Other Topics Concern  . Not on file   Social History Narrative   Marital status:  Single; not dating seriously.      Children:  3 sons (39, 34, 46), 1 daughter (deceased); no grandchildren      Lives:  With 2 sons      Employment:  Full time; Corporate treasurer at [employer]      Tobacco: quit June 2015      Alcohol:  None      Drugs:  None      Exercise:  Leg lifts for knee strengthening; walking   1 caffeine beverage daily      Sexual activity:  Active; total partners = up there.  No STDs in past.        Family History  Problem Relation Age of Onset  . Hypertension Mother   . Diabetes Mother   . Sarcoidosis Mother     lungs and skin  . Asthma Mother   . Hypertension Father   . Diabetes Father   . Asthma Son   . Asthma Sister   . Cancer Sister     possible pancreatic cancer  . Asthma Brother   . Asthma Daughter     died age 223.5  . Cancer  Daughter 4    brain; died age 223.5  . Asthma Son        Objective:    BP 114/84 mmHg  Pulse 78  Temp(Src) 99 F (37.2 C) (Oral)  Ht 5' 6.5" (1.689 m)  Wt 320 lb (145.151 kg)  BMI 50.88 kg/m2  SpO2 99%  LMP 05/02/2015 Physical Exam  Constitutional: She is oriented to person, place, and time. She appears well-developed and well-nourished. No distress.  obese  HENT:  Head: Normocephalic and atraumatic.  Right Ear: External ear normal.  Left Ear: External ear normal.  Nose: Nose normal.  Mouth/Throat: Oropharynx is clear and moist.  Upper R gumline with erythema.  Eyes: Conjunctivae and EOM are normal. Pupils are equal, round, and reactive to light.  Neck: Normal range of motion and full passive range of motion without pain. Neck supple. No JVD present. Carotid bruit is not present. No thyromegaly present.  Cardiovascular: Normal rate, regular rhythm and normal heart sounds.  Exam reveals no gallop and no friction rub.   No murmur heard. Pulmonary/Chest: Effort normal and breath sounds normal. She has no wheezes. She has no rales.  Abdominal: Soft. Bowel sounds are normal. She exhibits no distension and no mass. There is no tenderness. There is no rebound and no guarding.  Genitourinary: Vagina normal and uterus normal.    There is no rash, tenderness or lesion on the right labia. There is no rash, tenderness or lesion on the left labia. Cervix exhibits no motion tenderness, no discharge and no friability. Right adnexum displays no mass, no tenderness and no fullness. Left adnexum displays no mass, no tenderness and no fullness. No vaginal discharge found.  Skin tag versus wart as depicted.  Musculoskeletal:       Right shoulder: Normal.       Left shoulder: Normal.       Cervical back: Normal.  Lymphadenopathy:    She has no cervical adenopathy.  Neurological: She is alert and oriented to person, place, and time. She has normal reflexes. No cranial nerve deficit. She exhibits  normal muscle tone. Coordination normal.  Skin: Skin is warm and dry. No rash noted. She is not diaphoretic. No erythema. No pallor.  Scattered nevi along R foot.  Scattered small ecchymoses along extremities.  Psychiatric: She has a normal mood and affect. Her behavior is normal. Judgment and thought content normal.  Nursing note and vitals reviewed.  Results for orders placed or performed in visit on 05/18/15  POCT urinalysis dipstick  Result Value Ref Range  Color, UA yellow    Clarity, UA clear    Glucose, UA negative    Bilirubin, UA negative    Ketones, UA negative    Spec Grav, UA 1.025    Blood, UA trace-lysed    pH, UA 6.5    Protein, UA trace    Urobilinogen, UA 0.2    Nitrite, UA negative    Leukocytes, UA Negative Negative  POCT Wet Prep with KOH  Result Value Ref Range   Trichomonas, UA Negative    Clue Cells Wet Prep HPF POC Negative    Epithelial Wet Prep HPF POC Many Few, Moderate, Many   Yeast Wet Prep HPF POC Negative    Bacteria Wet Prep HPF POC Many (A) None, Few   RBC Wet Prep HPF POC Negative    WBC Wet Prep HPF POC 0-3    KOH Prep POC Negative    TDAP administered by Reather Converse, CMA  PROCEDURE: CRYOTHERAPY X 3 APPLIED TO SKIN LESION AT INTROITUS.  PT TOLERATED WELL.    Assessment & Plan:   1. Routine physical examination   2. Encounter for routine gynecological examination   3. Dysuria   4. Bruising   5. Viral wart   6. Screening for diabetes mellitus   7. Screening, lipid   8. Morbid obesity   9. High risk sexual behavior   10. Anxiety and depression   11. Skin lesion     1. Complete Physical Examination: anticipatory guidance --- exercise, weight loss.  Pap smear obtained.  Immunizations reviewed; s/p TDAP.   2.  Gynecological exam: pap smear obtained; s/p Novasure.  S/p BTL.   3.  Dysuria:  New. Send urine culture, GC/Chlam.   4.  Bruising: New. Send CBC. 5.  Viral wart genital lesion: New. Wart versus skin tag; s/p cryotherapy. 6.   Screening DMII: obtain glucose, HgbA1c. 7.  Screening lipid: Obtain FLP. 8.  Morbid obesity: recommend 3 month trial of weight loss, exercise, low-caloric food choices with goal caloric intake of 1500kcal per day;also recommend exercise 3-7 days per week for 30 minutes each session; recommend stationary bike, walking, or elliptical.  RTC three months for weight check; pt requested trial of appetite suppressant; require patient to try weight loss and exercise for three months. 9.  High risk sexual behavior: New. Obtain GC/Chlam/RPR/HIV. 10. Anxiety and depression: chronic; pt agreeable to PRN medication for anxiety; rx for hydroxyzine provided for PRN use.  Continue psychotherapy with children's therapist. 11. Ulcerative colitis: stable; non-compliant with daily medication.   No orders of the defined types were placed in this encounter.    No Follow-up on file.   Ziya Coonrod Elayne Guerin, M.D. [facility] [address] [place], [place]  [phone] phone [phone] fax

## 2015-05-20 LAB — URINE CULTURE: Colony Count: >=100000

## 2015-05-20 LAB — VITAMIN D 25 HYDROXY (VIT D DEFICIENCY, FRACTURES): Vit D, 25-Hydroxy: 9 ng/mL — ABNORMAL LOW (ref 30–100)

## 2015-05-21 LAB — PAP IG, CT-NG NAA, HPV HIGH-RISK
Chlamydia Probe Amp: NEGATIVE
GC Probe Amp: NEGATIVE
HPV DNA HIGH RISK: NOT DETECTED

## 2015-05-21 MED ORDER — VITAMIN D (ERGOCALCIFEROL) 1.25 MG (50000 UNIT) PO CAPS: 50000.0000 [IU] | ORAL_CAPSULE | ORAL | Status: DC

## 2015-05-21 MED ORDER — NITROFURANTOIN MONOHYD MACRO 100 MG PO CAPS: 100.0000 mg | ORAL_CAPSULE | Freq: Two times a day (BID) | ORAL | Status: DC

## 2015-05-21 NOTE — Addendum Note (Signed)
Addended by: Wardell Honour on: 05/21/2015 04:33 PM   Modules accepted: Orders

## 2015-05-22 ENCOUNTER — Ambulatory Visit: Payer: 59 | Admitting: Internal Medicine

## 2015-05-22 ENCOUNTER — Telehealth: Payer: Self-pay | Admitting: Internal Medicine

## 2015-05-22 NOTE — Telephone Encounter (Signed)
No charge. 

## 2015-05-24 LAB — HSV(HERPES SIMPLEX VRS) I + II AB-IGM: HERPES SIMPLEX VRS I-IGM AB (EIA): 0.87 {index}

## 2015-08-27 ENCOUNTER — Encounter: Payer: Self-pay | Admitting: Internal Medicine

## 2015-09-02 ENCOUNTER — Encounter: Payer: Self-pay | Admitting: Family Medicine

## 2015-09-02 ENCOUNTER — Ambulatory Visit (INDEPENDENT_AMBULATORY_CARE_PROVIDER_SITE_OTHER): Payer: 59 | Admitting: Family Medicine

## 2015-09-02 VITALS — BP 127/83 | HR 82 | Temp 98.2°F | Resp 16 | Ht 67.5 in | Wt 307.8 lb

## 2015-09-02 DIAGNOSIS — D649 Anemia, unspecified: Secondary | ICD-10-CM | POA: Diagnosis not present

## 2015-09-02 DIAGNOSIS — E669 Obesity, unspecified: Secondary | ICD-10-CM | POA: Diagnosis not present

## 2015-09-02 DIAGNOSIS — S161XXA Strain of muscle, fascia and tendon at neck level, initial encounter: Secondary | ICD-10-CM

## 2015-09-02 DIAGNOSIS — K51511 Left sided colitis with rectal bleeding: Secondary | ICD-10-CM

## 2015-09-02 DIAGNOSIS — E559 Vitamin D deficiency, unspecified: Secondary | ICD-10-CM | POA: Diagnosis not present

## 2015-09-02 DIAGNOSIS — N3941 Urge incontinence: Secondary | ICD-10-CM | POA: Diagnosis not present

## 2015-09-02 DIAGNOSIS — F32A Depression, unspecified: Secondary | ICD-10-CM

## 2015-09-02 DIAGNOSIS — Z20828 Contact with and (suspected) exposure to other viral communicable diseases: Secondary | ICD-10-CM

## 2015-09-02 DIAGNOSIS — F418 Other specified anxiety disorders: Secondary | ICD-10-CM

## 2015-09-02 DIAGNOSIS — Z23 Encounter for immunization: Secondary | ICD-10-CM | POA: Diagnosis not present

## 2015-09-02 DIAGNOSIS — F329 Major depressive disorder, single episode, unspecified: Secondary | ICD-10-CM

## 2015-09-02 DIAGNOSIS — L739 Follicular disorder, unspecified: Secondary | ICD-10-CM

## 2015-09-02 DIAGNOSIS — F419 Anxiety disorder, unspecified: Principal | ICD-10-CM

## 2015-09-02 LAB — CBC WITH DIFFERENTIAL/PLATELET
BASOS PCT: 0 % (ref 0–1)
Basophils Absolute: 0 10*3/uL (ref 0.0–0.1)
EOS PCT: 4 % (ref 0–5)
Eosinophils Absolute: 0.4 10*3/uL (ref 0.0–0.7)
HEMATOCRIT: 32.7 % — AB (ref 36.0–46.0)
Hemoglobin: 10 g/dL — ABNORMAL LOW (ref 12.0–15.0)
Lymphocytes Relative: 29 % (ref 12–46)
Lymphs Abs: 3 10*3/uL (ref 0.7–4.0)
MCH: 23.3 pg — AB (ref 26.0–34.0)
MCHC: 30.6 g/dL (ref 30.0–36.0)
MCV: 76.2 fL — ABNORMAL LOW (ref 78.0–100.0)
MONO ABS: 0.5 10*3/uL (ref 0.1–1.0)
MONOS PCT: 5 % (ref 3–12)
MPV: 10.4 fL (ref 8.6–12.4)
Neutro Abs: 6.4 10*3/uL (ref 1.7–7.7)
Neutrophils Relative %: 62 % (ref 43–77)
Platelets: 472 10*3/uL — ABNORMAL HIGH (ref 150–400)
RBC: 4.29 MIL/uL (ref 3.87–5.11)
RDW: 15.7 % — AB (ref 11.5–15.5)
WBC: 10.4 10*3/uL (ref 4.0–10.5)

## 2015-09-02 LAB — POCT URINALYSIS DIP (MANUAL ENTRY)
BILIRUBIN UA: NEGATIVE
Bilirubin, UA: NEGATIVE
Glucose, UA: NEGATIVE
Leukocytes, UA: NEGATIVE
Nitrite, UA: NEGATIVE
PROTEIN UA: NEGATIVE
RBC UA: NEGATIVE
Urobilinogen, UA: 0.2
pH, UA: 5.5

## 2015-09-02 LAB — POC MICROSCOPIC URINALYSIS (UMFC): MUCUS RE: ABSENT

## 2015-09-02 LAB — BASIC METABOLIC PANEL
BUN: 14 mg/dL (ref 7–25)
CALCIUM: 9.2 mg/dL (ref 8.6–10.2)
CO2: 25 mmol/L (ref 20–31)
Chloride: 107 mmol/L (ref 98–110)
Creat: 0.85 mg/dL (ref 0.50–1.10)
GLUCOSE: 76 mg/dL (ref 65–99)
POTASSIUM: 4.8 mmol/L (ref 3.5–5.3)
SODIUM: 140 mmol/L (ref 135–146)

## 2015-09-02 LAB — IBC PANEL
%SAT: 5 % — AB (ref 11–50)
TIBC: 393 ug/dL (ref 250–450)
UIBC: 372 ug/dL (ref 125–400)

## 2015-09-02 LAB — IRON: IRON: 21 ug/dL — AB (ref 40–190)

## 2015-09-02 LAB — VITAMIN D 25 HYDROXY (VIT D DEFICIENCY, FRACTURES): VIT D 25 HYDROXY: 17 ng/mL — AB (ref 30–100)

## 2015-09-02 MED ORDER — PHENTERMINE HCL 15 MG PO CAPS: 15.0000 mg | ORAL_CAPSULE | ORAL | Status: DC

## 2015-09-02 MED ORDER — DULOXETINE HCL 30 MG PO CPEP: 30.0000 mg | ORAL_CAPSULE | Freq: Every day | ORAL | Status: DC

## 2015-09-02 MED ORDER — MUPIROCIN 2 % EX OINT: 1.0000 "application " | TOPICAL_OINTMENT | Freq: Three times a day (TID) | CUTANEOUS | Status: DC

## 2015-09-02 MED ORDER — METHOCARBAMOL 500 MG PO TABS: 500.0000 mg | ORAL_TABLET | Freq: Four times a day (QID) | ORAL | Status: DC | PRN

## 2015-09-02 NOTE — Patient Instructions (Signed)
1. [email or web address]

## 2015-09-02 NOTE — Progress Notes (Addendum)
Subjective:    Patient ID: [name], female    DOB: 10-Nov-1975, 39 y.o.   MRN: [id number]  09/02/2015  Follow-up; Medication Refill; and Depression   HPI This 39 y.o. female presents for four month follow-up:   1. Vitamin D deficiency:  Started Vitamin D weekly at last visit; last dose vomited afterwards. Due for repeat labs.  2. Iron deficiency anemia:  Taking iron supplement/Hemax.  Due for repeat labs.  3. Asthma: Needs refill of albuterol.    Using Albuterol a lot recently.  4. Depression:  PHQ-9 21 with suicidal ideation.  Prescribed Atarax PRN. Previously went to [facility].  Has tried Prozac, Celexa.  No previous Effexor or cymbalta.  Not sure if has tried Zoloft.   5.  L knee pain: s/p arthroscopy in 08/2014; unable to perform surgery on R knee due to obesity; Alvan Dame.  Last visit last week with injection; going today for another injection Euflexa.  Too young for a knee replacement.  Trying to do water aerobics.    6.  Obesity: weight down 13 pounds from last visit; started eating right for your blood type.  Eating a lot of spinach and brussel sprouts.  Adds to a smoothie.  Unable to exercise currently due to knee issues/pain.   7.  Neck pain: having horrible neck pain.  Crying self to sleep.  Has not mentioned to Dr. Alvan Dame.  Onset one week ago.  Severe for past week; difficult to sleep.  L sided pain and occipital region.  Friend gave massage without relief.  Taking Hydrocodone but out.  Started taking Ibuprofen; Tylenol makes sleepy.  No muscle relaxer currently.    8. Concern for herpes: last STD screening 04/2015; now sexually active recently with ex-boyfriend.  Now worried about herpes because boyfriend with HSV.  Boyfriend has medication for HSV.    9. Urinary incontinence:  Walking to restroom and had incontinence.  10.  Vaginal skin tag:  Cryotherapy not effective.  Thinks that has cyst x 2; painful.  With cyst ruptures, there is drainage and odor.     Review of  Systems  Constitutional: Negative for fever, chills, diaphoresis and fatigue.  Eyes: Negative for visual disturbance.  Respiratory: Negative for cough and shortness of breath.   Cardiovascular: Negative for chest pain, palpitations and leg swelling.  Gastrointestinal: Negative for nausea, vomiting, abdominal pain, diarrhea and constipation.  Endocrine: Negative for cold intolerance, heat intolerance, polydipsia, polyphagia and polyuria.  Genitourinary: Positive for urgency, genital sores and vaginal pain. Negative for dysuria, frequency, hematuria, flank pain, vaginal discharge, difficulty urinating and pelvic pain.  Musculoskeletal: Positive for arthralgias, gait problem, neck pain and neck stiffness.  Neurological: Negative for dizziness, tremors, seizures, syncope, facial asymmetry, speech difficulty, weakness, light-headedness, numbness and headaches.  Hematological: Bruises/bleeds easily.  Psychiatric/Behavioral: Positive for suicidal ideas and dysphoric mood. Negative for sleep disturbance and self-injury. The patient is nervous/anxious.     Past Medical History  Diagnosis Date  . Bronchitis   . Asthma   . Clostridium difficile infection 04/20/2012  . Obesity   . Osteoarthritis     Both knees  . Shortness of breath     on exertion  . Internal and external bleeding hemorrhoids 06/11/2014  . Left sided chronic colitis - segmental 06/11/2014  . TOBACCO USER 10/02/2009    Qualifier: Diagnosis of  By: Dimas Millin MD, Ellard Artis    . Anal fissure - posterior 10/16/2014  . IDA (iron deficiency anemia)   . Ulcerative colitis ([place])  Past Surgical History  Procedure Laterality Date  . Hemrrhoid surgery    . Colonoscopy  2007    for rectal bleeding; [facility]  . Laparoscopic tubal ligation  10/16/2011    Procedure: LAPAROSCOPIC TUBAL LIGATION;  Surgeon: Alwyn Pea, MD;  Location: [facility];  Service: Gynecology;  Laterality: Bilateral;  . Foot surgery    . Tubal ligation    . Colonoscopy N/A  06/11/2014    Procedure: COLONOSCOPY;  Surgeon: Gatha Mayer, MD;  Location: [facility];  Service: Endoscopy;  Laterality: N/A;  . Knee arthroscopy Left 09/06/2014  . Novasure ablation  09/28/2010    mild persistent vaginal bleeding   Allergies  Allergen Reactions  . Oxycodone Anaphylaxis  . Ciprofloxacin Itching and Rash  . Doxycycline Nausea And Vomiting  . Penicillins Rash   Current Outpatient Prescriptions  Medication Sig Dispense Refill  . albuterol (PROVENTIL HFA;VENTOLIN HFA) 108 (90 BASE) MCG/ACT inhaler Inhale 2 puffs into the lungs every 6 (six) hours as needed for wheezing or shortness of breath. For shortness of breath    . dicyclomine (BENTYL) 20 MG tablet Take 1 tablet (20 mg total) by mouth 4 (four) times daily -  before meals and at bedtime. (Patient taking differently: Take 20 mg by mouth 2 (two) times daily as needed for spasms. ) 120 tablet 3  . HYDROcodone-acetaminophen (NORCO/VICODIN) 5-325 MG per tablet Take 1-2 tablets by mouth every 4 (four) hours as needed. 12 tablet 0  . ibuprofen (ADVIL,MOTRIN) 200 MG tablet Take 400 mg by mouth every 6 (six) hours as needed for headache.    . ipratropium (ATROVENT) 0.03 % nasal spray Place 2 sprays into both nostrils 2 (two) times daily. (Patient taking differently: Place 2 sprays into both nostrils 2 (two) times daily as needed for rhinitis. ) 30 mL 0  . Iron-DSS-B12-FA-C-E-Cu-Biotin (HEMAX) 150-1 MG TABS Take 1 tablet by mouth 2 (two) times daily. 60 tablet 2  . mesalamine (CANASA) 1000 MG suppository Place 1 suppository (1,000 mg total) rectally at bedtime. 9 suppository 0  . mesalamine (LIALDA) 1.2 G EC tablet Take 4 tablets (4.8 g total) by mouth daily with breakfast. 120 tablet 3  . Vitamin D, Ergocalciferol, (DRISDOL) 50000 UNITS CAPS capsule Take 1 capsule (50,000 Units total) by mouth every 7 (seven) days. 12 capsule 0  . DULoxetine (CYMBALTA) 30 MG capsule Take 1 capsule (30 mg total) by mouth daily. 30 capsule 3  .  hydrOXYzine (ATARAX/VISTARIL) 25 MG tablet Take 0.5-1 tablets (12.5-25 mg total) by mouth every 8 (eight) hours as needed for anxiety. (Patient not taking: Reported on 09/02/2015) 45 tablet 2  . methocarbamol (ROBAXIN) 500 MG tablet Take 1 tablet (500 mg total) by mouth every 6 (six) hours as needed for muscle spasms. 40 tablet 0  . mupirocin ointment (BACTROBAN) 2 % Apply 1 application topically 3 (three) times daily. 30 g 0  . nitrofurantoin, macrocrystal-monohydrate, (MACROBID) 100 MG capsule Take 1 capsule (100 mg total) by mouth 2 (two) times daily. (Patient not taking: Reported on 09/02/2015) 14 capsule 0  . phentermine 15 MG capsule Take 1 capsule (15 mg total) by mouth every morning. 30 capsule 1  . predniSONE (DELTASONE) 10 MG tablet Take 40 mg by mouth for 5 days, 30 mg for 5 days, 20 mg until you see MD (Patient not taking: Reported on 05/18/2015) 100 tablet 0   No current facility-administered medications for this visit.   Social History   Social History  . Marital Status:  Single    Spouse Name: N/A  . Number of Children: N/A  . Years of Education: N/A   Occupational History  . Not on file.   Social History Main Topics  . Smoking status: Former Smoker -- 0.50 packs/day for 20 years    Types: Cigarettes    Quit date: 03/17/2014  . Smokeless tobacco: Never Used  . Alcohol Use: No  . Drug Use: No  . Sexual Activity: Yes    Birth Control/ Protection: Condom   Other Topics Concern  . Not on file   Social History Narrative   Marital status:  Single; not dating seriously.      Children:  3 sons (42, 57, 99), 1 daughter (deceased); no grandchildren      Lives:  With 2 sons      Employment:  Full time; Corporate treasurer at [employer]      Tobacco: quit June 2015      Alcohol:  None      Drugs:  None      Exercise:  Leg lifts for knee strengthening; walking   1 caffeine beverage daily      Sexual activity:  Active; total partners = up there.  No STDs in past.         Family History  Problem Relation Age of Onset  . Hypertension Mother   . Diabetes Mother   . Sarcoidosis Mother     lungs and skin  . Asthma Mother   . Hypertension Father   . Diabetes Father   . Asthma Son   . Asthma Sister   . Cancer Sister     possible pancreatic cancer  . Asthma Brother   . Asthma Daughter     died age 19.5  . Cancer Daughter 4    brain; died age 19.5  . Asthma Son        Objective:    BP 127/83 mmHg  Pulse 82  Temp(Src) 98.2 F (36.8 C) (Oral)  Resp 16  Ht 5' 7.5" (1.715 m)  Wt 307 lb 12.8 oz (139.617 kg)  BMI 47.47 kg/m2  SpO2 98% Physical Exam  Constitutional: She is oriented to person, place, and time. She appears well-developed and well-nourished. No distress.  HENT:  Head: Normocephalic and atraumatic.  Right Ear: External ear normal.  Left Ear: External ear normal.  Nose: Nose normal.  Mouth/Throat: Oropharynx is clear and moist.  Eyes: Conjunctivae and EOM are normal. Pupils are equal, round, and reactive to light.  Neck: Normal range of motion. Neck supple. Carotid bruit is not present. No thyromegaly present.  Cardiovascular: Normal rate, regular rhythm, normal heart sounds and intact distal pulses.  Exam reveals no gallop and no friction rub.   No murmur heard. Pulmonary/Chest: Effort normal and breath sounds normal. She has no wheezes. She has no rales.  Abdominal: Soft. Bowel sounds are normal. She exhibits no distension and no mass. There is no tenderness. There is no rebound and no guarding.  Lymphadenopathy:    She has no cervical adenopathy.  Neurological: She is alert and oriented to person, place, and time. No cranial nerve deficit.  Skin: Skin is warm and dry. No rash noted. She is not diaphoretic. No erythema. No pallor.  Psychiatric: She has a normal mood and affect. Her behavior is normal.   Results for orders placed or performed in visit on 05/18/15  Urine culture  Result Value Ref Range   Culture ESCHERICHIA COLI  Colony Count >=100,000 COLONIES/ML    Organism ID, Bacteria ESCHERICHIA COLI       Susceptibility   Escherichia coli -  (no method available)    AMPICILLIN <=2 Sensitive     AMOX/CLAVULANIC <=2 Sensitive     AMPICILLIN/SULBACTAM 4 Sensitive     PIP/TAZO <=4 Sensitive     IMIPENEM <=0.25 Sensitive     CEFTRIAXONE <=1 Sensitive     CEFTAZIDIME <=1 Sensitive     CEFEPIME <=1 Sensitive     GENTAMICIN <=1 Sensitive     TOBRAMYCIN <=1 Sensitive     CIPROFLOXACIN <=0.25 Sensitive     LEVOFLOXACIN <=0.12 Sensitive     NITROFURANTOIN <=16 Sensitive     TRIMETH/SULFA* <=20 Sensitive      * ORAL therapy:A cefazolin MIC of <32 predicts susceptibility to the oral agents cefaclor,cefdinir,cefpodoxime,cefprozil,cefuroxime,cephalexin,and loracarbef when used for therapy of uncomplicated UTIs due to E.coli,K.pneumomiae,and P.mirabilis. PARENTERAL therapy: A cefazolinMIC of >8 indicates resistance to parenteralcefazolin. An alternate test method must beperformed to confirm susceptibility to parenteralcefazolin.  CBC with Differential/Platelet  Result Value Ref Range   WBC 12.7 (H) 4.0 - 10.5 K/uL   RBC 4.24 3.87 - 5.11 MIL/uL   Hemoglobin 10.7 (L) 12.0 - 15.0 g/dL   HCT 33.6 (L) 36.0 - 46.0 %   MCV 79.2 78.0 - 100.0 fL   MCH 25.2 (L) 26.0 - 34.0 pg   MCHC 31.8 30.0 - 36.0 g/dL   RDW 21.1 (H) 11.5 - 15.5 %   Platelets 419 (H) 150 - 400 K/uL   MPV 10.5 8.6 - 12.4 fL   Neutrophils Relative % 64 43 - 77 %   Neutro Abs 8.1 (H) 1.7 - 7.7 K/uL   Lymphocytes Relative 29 12 - 46 %   Lymphs Abs 3.7 0.7 - 4.0 K/uL   Monocytes Relative 4 3 - 12 %   Monocytes Absolute 0.5 0.1 - 1.0 K/uL   Eosinophils Relative 3 0 - 5 %   Eosinophils Absolute 0.4 0.0 - 0.7 K/uL   Basophils Relative 0 0 - 1 %   Basophils Absolute 0.0 0.0 - 0.1 K/uL   Smear Review Criteria for review not met   Comprehensive metabolic panel  Result Value Ref Range   Sodium 139 135 - 146 mmol/L   Potassium 4.6 3.5 - 5.3 mmol/L    Chloride 107 98 - 110 mmol/L   CO2 22 20 - 31 mmol/L   Glucose, Bld 77 65 - 99 mg/dL   BUN 13 7 - 25 mg/dL   Creat 0.92 0.50 - 1.10 mg/dL   Total Bilirubin 0.4 0.2 - 1.2 mg/dL   Alkaline Phosphatase 78 33 - 115 U/L   AST 10 10 - 30 U/L   ALT 9 6 - 29 U/L   Total Protein 7.0 6.1 - 8.1 g/dL   Albumin 3.8 3.6 - 5.1 g/dL   Calcium 9.3 8.6 - 10.2 mg/dL  Hemoglobin A1c  Result Value Ref Range   Hgb A1c MFr Bld 5.8 (H) <5.7 %   Mean Plasma Glucose 120 (H) <117 mg/dL  Lipid panel  Result Value Ref Range   Cholesterol 172 125 - 200 mg/dL   Triglycerides 78 <150 mg/dL   HDL 51 >=46 mg/dL   Total CHOL/HDL Ratio 3.4 <=5.0 Ratio   VLDL 16 <30 mg/dL   LDL Cholesterol 105 <130 mg/dL  TSH  Result Value Ref Range   TSH 1.514 0.350 - 4.500 uIU/mL  Vitamin B12  Result Value Ref Range  Vitamin B-12 662 211 - 911 pg/mL  Vit D  25 hydroxy (rtn osteoporosis monitoring)  Result Value Ref Range   Vit D, 25-Hydroxy 9 (L) 30 - 100 ng/mL  RPR  Result Value Ref Range   RPR Ser Ql NON REAC NON REAC  HIV antibody  Result Value Ref Range   HIV 1&2 Ab, 4th Generation NONREACTIVE NONREACTIVE  POCT urinalysis dipstick  Result Value Ref Range   Color, UA yellow    Clarity, UA clear    Glucose, UA negative    Bilirubin, UA negative    Ketones, UA negative    Spec Grav, UA 1.025    Blood, UA trace-lysed    pH, UA 6.5    Protein, UA trace    Urobilinogen, UA 0.2    Nitrite, UA negative    Leukocytes, UA Negative Negative  POCT Wet Prep with KOH  Result Value Ref Range   Trichomonas, UA Negative    Clue Cells Wet Prep HPF POC Negative    Epithelial Wet Prep HPF POC Many Few, Moderate, Many   Yeast Wet Prep HPF POC Negative    Bacteria Wet Prep HPF POC Many (A) None, Few   RBC Wet Prep HPF POC Negative    WBC Wet Prep HPF POC 0-3    KOH Prep POC Negative   Pap IG, CT/NG NAA, and HPV (high risk)  Result Value Ref Range   HPV DNA High Risk Not Detected    Specimen adequacy: SEE NOTE    FINAL  DIAGNOSIS: SEE NOTE    Cytotechnologist: SEE NOTE    Chlamydia Probe Amp NEGATIVE    GC Probe Amp NEGATIVE        Assessment & Plan:   1. Anxiety and depression   2. Vitamin D deficiency   3. Anemia, unspecified anemia type   4. Left sided colitis, with rectal bleeding ([place])   5. Neck strain, initial encounter   6. Exposure to herpes   7. Urge incontinence   8. Folliculitis   9. Obesity   10. Need for prophylactic vaccination and inoculation against influenza     Obesity Morbid: improving; congratulations with 13 pound weight loss in four months; unable to exercise due to osteoarthritis in knees.  Rx for Phentermine provided; recommended food diary via https://www.matthews.info/.  Goal of 1500kcal per day.  Goal protein intake of 60 grams per day; goal of 15-20 grams of protein with each meal. RTC one month.  Orders Placed This Encounter  Procedures  . Urine culture  . CBC with Differential/Platelet  . Basic metabolic panel  . VITAMIN D 25 Hydroxy (Vit-D Deficiency, Fractures)  . Iron  . IBC panel  . HSV(herpes simplex vrs) 1+2 ab-IgG  . POCT urinalysis dipstick  . POCT Microscopic Urinalysis ([facility])   Meds ordered this encounter  Medications  . DULoxetine (CYMBALTA) 30 MG capsule    Sig: Take 1 capsule (30 mg total) by mouth daily.    Dispense:  30 capsule    Refill:  3  . methocarbamol (ROBAXIN) 500 MG tablet    Sig: Take 1 tablet (500 mg total) by mouth every 6 (six) hours as needed for muscle spasms.    Dispense:  40 tablet    Refill:  0  . phentermine 15 MG capsule    Sig: Take 1 capsule (15 mg total) by mouth every morning.    Dispense:  30 capsule    Refill:  1  . mupirocin ointment (BACTROBAN) 2 %  Sig: Apply 1 application topically 3 (three) times daily.    Dispense:  30 g    Refill:  0    Return in about 4 weeks (around 09/30/2015) for recheck anxiety, weight.    Kristi Elayne Guerin, M.D. [facility] [address] [place], [place]  [address] [phone] phone [phone] fax

## 2015-09-03 LAB — HSV(HERPES SIMPLEX VRS) I + II AB-IGG
HSV 1 Glycoprotein G Ab, IgG: <0.1 IV
HSV 2 Glycoprotein G Ab, IgG: <0.1 IV

## 2015-09-03 LAB — URINE CULTURE
Colony Count: NO GROWTH
Organism ID, Bacteria: NO GROWTH

## 2015-09-30 ENCOUNTER — Ambulatory Visit (INDEPENDENT_AMBULATORY_CARE_PROVIDER_SITE_OTHER): Payer: 59 | Admitting: Family Medicine

## 2015-09-30 ENCOUNTER — Encounter: Payer: Self-pay | Admitting: Family Medicine

## 2015-09-30 VITALS — BP 132/76 | HR 80 | Temp 98.2°F | Resp 16 | Ht 67.5 in | Wt 307.0 lb

## 2015-09-30 DIAGNOSIS — D509 Iron deficiency anemia, unspecified: Secondary | ICD-10-CM | POA: Diagnosis not present

## 2015-09-30 DIAGNOSIS — K5150 Left sided colitis without complications: Secondary | ICD-10-CM

## 2015-09-30 DIAGNOSIS — F32A Depression, unspecified: Secondary | ICD-10-CM

## 2015-09-30 DIAGNOSIS — E559 Vitamin D deficiency, unspecified: Secondary | ICD-10-CM | POA: Diagnosis not present

## 2015-09-30 DIAGNOSIS — J4520 Mild intermittent asthma, uncomplicated: Secondary | ICD-10-CM | POA: Diagnosis not present

## 2015-09-30 DIAGNOSIS — M170 Bilateral primary osteoarthritis of knee: Secondary | ICD-10-CM | POA: Diagnosis not present

## 2015-09-30 DIAGNOSIS — F329 Major depressive disorder, single episode, unspecified: Secondary | ICD-10-CM | POA: Diagnosis not present

## 2015-09-30 MED ORDER — HEMAX 150-1 MG PO TABS: 1.0000 | ORAL_TABLET | Freq: Two times a day (BID) | ORAL | Status: DC

## 2015-09-30 MED ORDER — VITAMIN D (ERGOCALCIFEROL) 1.25 MG (50000 UNIT) PO CAPS: 50000.0000 [IU] | ORAL_CAPSULE | ORAL | Status: DC

## 2015-09-30 MED ORDER — PHENTERMINE HCL 30 MG PO CAPS: 30.0000 mg | ORAL_CAPSULE | ORAL | Status: DC

## 2015-09-30 MED ORDER — ALBUTEROL SULFATE HFA 108 (90 BASE) MCG/ACT IN AERS: 2.0000 | INHALATION_SPRAY | Freq: Four times a day (QID) | RESPIRATORY_TRACT | Status: DC | PRN

## 2015-09-30 NOTE — Progress Notes (Addendum)
Subjective:    Patient ID: [name], female    DOB: 21-Aug-1976, 40 y.o.   MRN: [id number]  09/30/2015  Depression; Weight Loss; and Medication Refill   HPI This 40 y.o. female presents for one month follow-up:  1. Anxiety and depression: did not start Cymbalta after last visit.  After visit, started Phentermine and planned to start Cymbalta 60m the following week yet picked son up from college and mood has been much improved.  Now staying much busier; other boys are home for break, so has really been much better.  Things have gotten harder for past year with son graduating from [employer] [place] [employer].  Boys are [phone] old. Had a daughter who passed away; has separation anxiety due to death of daughter.  Daughter with brain tumor pons at age [age]years old in 2003.    Passed 11/15/12003.  Birthday is [birth year]; does balloons.  No SI.  2.  Anemia iron deficiency:  Iron levels are still low;  Ran out of Hemax.  Took Hemax for one year. S/p blood transfusion 02/2015, 05/2014.  Hgb of 9.0 with blood transfusion.  3. Vitamin D deficiency:  Vitamin D low at 17 at last visit.  4. Urinary incontinence: no recurrence; only occurs when coughing or sneezing.  5. Asthma: needs refill of Albuterol; usually uses Albuterol twice weekly.    6.  Obesity:  Started Phentermine 134mdaily.  No weight loss in office today.  When went to orthopedic MD; weighed 301.5 on 09/09/15.  Weight gain during the holidays.  Decreased appetite on Phentermine.  Started [email or web address] Went to bariatric clinic/[facility] on 09/17/15.  Must do six months of monitored weight loss.   Good candidate for the sleeve.    B:  Skips a lot; might eat bagel plain, butter; water or milk 2% Snack: none Lunch:  If eats, leftovers (yellow rice, chicken or steak, grilled onions); no fried foods.  Broccoli or spinach.  Water. Snack:  None Supper: meat, starch, vegetable.   Snack: Lays potato chips. Boiled eggs.    Review of  Systems  Constitutional: Negative for fever, chills, diaphoresis and fatigue.  Eyes: Negative for visual disturbance.  Respiratory: Negative for cough and shortness of breath.   Cardiovascular: Negative for chest pain, palpitations and leg swelling.  Gastrointestinal: Negative for nausea, vomiting, abdominal pain, diarrhea and constipation.  Endocrine: Negative for cold intolerance, heat intolerance, polydipsia, polyphagia and polyuria.  Neurological: Negative for dizziness, tremors, seizures, syncope, facial asymmetry, speech difficulty, weakness, light-headedness, numbness and headaches.  Psychiatric/Behavioral: Negative for suicidal ideas, sleep disturbance, self-injury and dysphoric mood. The patient is not nervous/anxious.     Past Medical History  Diagnosis Date  . Bronchitis   . Asthma   . Clostridium difficile infection 04/20/2012  . Obesity   . Osteoarthritis     Both knees  . Shortness of breath     on exertion  . Internal and external bleeding hemorrhoids 06/11/2014  . Left sided chronic colitis - segmental 06/11/2014  . TOBACCO USER 10/02/2009    Qualifier: Diagnosis of  By: InDimas MillinD, EvEllard Artis  . Anal fissure - posterior 10/16/2014  . IDA (iron deficiency anemia)   . [facility]   Past Surgical History  Procedure Laterality Date  . Hemrrhoid surgery    . Colonoscopy  2007    for rectal bleeding; [facility]  . Laparoscopic tubal ligation  10/16/2011    Procedure: LAPAROSCOPIC  TUBAL LIGATION;  Surgeon: Alwyn Pea, MD;  Location: [facility];  Service: Gynecology;  Laterality: Bilateral;  . Foot surgery    . Tubal ligation    . Colonoscopy N/A 06/11/2014    Procedure: COLONOSCOPY;  Surgeon: Gatha Mayer, MD;  Location: [facility];  Service: Endoscopy;  Laterality: N/A;  . Knee arthroscopy Left 09/06/2014  . Novasure ablation  09/28/2010    mild persistent vaginal bleeding   Allergies  Allergen Reactions  . Oxycodone Anaphylaxis  . Ciprofloxacin Itching and  Rash  . Doxycycline Nausea And Vomiting  . Penicillins Rash    Social History   Social History  . Marital Status: Single    Spouse Name: N/A  . Number of Children: N/A  . Years of Education: N/A   Occupational History  . Not on file.   Social History Main Topics  . Smoking status: Former Smoker -- 0.50 packs/day for 20 years    Types: Cigarettes    Quit date: 03/17/2014  . Smokeless tobacco: Never Used  . Alcohol Use: No  . Drug Use: No  . Sexual Activity: Yes    Birth Control/ Protection: Condom   Other Topics Concern  . Not on file   Social History Narrative   Marital status:  Single; not dating seriously.      Children:  3 sons (108, 19, 30), 1 daughter (deceased); no grandchildren      Lives:  With 2 sons      Employment:  Full time; Corporate treasurer at [employer]      Tobacco: quit June 2015      Alcohol:  None      Drugs:  None      Exercise:  Leg lifts for knee strengthening; walking   1 caffeine beverage daily      Sexual activity:  Active; total partners = up there.  No STDs in past.        Family History  Problem Relation Age of Onset  . Hypertension Mother   . Diabetes Mother   . Sarcoidosis Mother     lungs and skin  . Asthma Mother   . Hypertension Father   . Diabetes Father   . Asthma Son   . Asthma Sister   . Cancer Sister     possible pancreatic cancer  . Asthma Brother   . Asthma Daughter     died age 61.5  . Cancer Daughter 4    brain; died age 61.5  . Asthma Son        Objective:    BP 132/76 mmHg  Pulse 80  Temp(Src) 98.2 F (36.8 C)  Resp 16  Ht 5' 7.5" (1.715 m)  Wt 307 lb (139.254 kg)  BMI 47.35 kg/m2  SpO2 98% Physical Exam  Constitutional: She is oriented to person, place, and time. She appears well-developed and well-nourished. No distress.  HENT:  Head: Normocephalic and atraumatic.  Right Ear: External ear normal.  Left Ear: External ear normal.  Nose: Nose normal.  Mouth/Throat: Oropharynx is clear and  moist.  Eyes: Conjunctivae and EOM are normal. Pupils are equal, round, and reactive to light.  Neck: Normal range of motion. Neck supple. Carotid bruit is not present. No thyromegaly present.  Cardiovascular: Normal rate, regular rhythm, normal heart sounds and intact distal pulses.  Exam reveals no gallop and no friction rub.   No murmur heard. Pulmonary/Chest: Effort normal and breath sounds normal. She has no wheezes. She has no  rales.  Abdominal: Soft. Bowel sounds are normal. She exhibits no distension and no mass. There is no tenderness. There is no rebound and no guarding.  Lymphadenopathy:    She has no cervical adenopathy.  Neurological: She is alert and oriented to person, place, and time. No cranial nerve deficit.  Skin: Skin is warm and dry. No rash noted. She is not diaphoretic. No erythema. No pallor.  Psychiatric: She has a normal mood and affect. Her behavior is normal.   Results for orders placed or performed in visit on 09/02/15  Urine culture  Result Value Ref Range   Colony Count NO GROWTH    Organism ID, Bacteria NO GROWTH   CBC with Differential/Platelet  Result Value Ref Range   WBC 10.4 4.0 - 10.5 K/uL   RBC 4.29 3.87 - 5.11 MIL/uL   Hemoglobin 10.0 (L) 12.0 - 15.0 g/dL   HCT 32.7 (L) 36.0 - 46.0 %   MCV 76.2 (L) 78.0 - 100.0 fL   MCH 23.3 (L) 26.0 - 34.0 pg   MCHC 30.6 30.0 - 36.0 g/dL   RDW 15.7 (H) 11.5 - 15.5 %   Platelets 472 (H) 150 - 400 K/uL   MPV 10.4 8.6 - 12.4 fL   Neutrophils Relative % 62 43 - 77 %   Neutro Abs 6.4 1.7 - 7.7 K/uL   Lymphocytes Relative 29 12 - 46 %   Lymphs Abs 3.0 0.7 - 4.0 K/uL   Monocytes Relative 5 3 - 12 %   Monocytes Absolute 0.5 0.1 - 1.0 K/uL   Eosinophils Relative 4 0 - 5 %   Eosinophils Absolute 0.4 0.0 - 0.7 K/uL   Basophils Relative 0 0 - 1 %   Basophils Absolute 0.0 0.0 - 0.1 K/uL   Smear Review Criteria for review not met   Basic metabolic panel  Result Value Ref Range   Sodium 140 135 - 146 mmol/L    Potassium 4.8 3.5 - 5.3 mmol/L   Chloride 107 98 - 110 mmol/L   CO2 25 20 - 31 mmol/L   Glucose, Bld 76 65 - 99 mg/dL   BUN 14 7 - 25 mg/dL   Creat 0.85 0.50 - 1.10 mg/dL   Calcium 9.2 8.6 - 10.2 mg/dL  VITAMIN D 25 Hydroxy (Vit-D Deficiency, Fractures)  Result Value Ref Range   Vit D, 25-Hydroxy 17 (L) 30 - 100 ng/mL  Iron  Result Value Ref Range   Iron 21 (L) 40 - 190 ug/dL  IBC panel  Result Value Ref Range   UIBC 372 125 - 400 ug/dL   TIBC 393 250 - 450 ug/dL   %SAT 5 (L) 11 - 50 %  HSV(herpes simplex vrs) 1+2 ab-IgG  Result Value Ref Range   HSV 1 Glycoprotein G Ab, IgG <0.10 IV   HSV 2 Glycoprotein G Ab, IgG <0.10 IV  POCT urinalysis dipstick  Result Value Ref Range   Color, UA yellow yellow   Clarity, UA clear clear   Glucose, UA negative negative   Bilirubin, UA negative negative   Ketones, POC UA negative negative   Spec Grav, UA >=1.030    Blood, UA negative negative   pH, UA 5.5    Protein Ur, POC negative negative   Urobilinogen, UA 0.2    Nitrite, UA Negative Negative   Leukocytes, UA Negative Negative  POCT Microscopic Urinalysis ([facility])  Result Value Ref Range   WBC,UR,HPF,POC None None WBC/hpf   RBC,UR,HPF,POC None None RBC/hpf  Bacteria None None, Too numerous to count   Mucus Absent Absent   Epithelial Cells, UR Per Microscopy Many (A) None, Too numerous to count cells/hpf       Assessment & Plan:   1. Asthma, mild intermittent, uncomplicated   2. Morbid obesity due to excess calories ([place])   3. Left sided colitis, without complications ([place])   4. Anemia, iron deficiency   5. Vitamin D deficiency   6. Depression   7. Primary osteoarthritis of both knees    1. Asthma; stable; refill of Albuterol provided; using sparingly. 2.  Morbid Obesity: unchanged weight in past month which is actual success during the holidays; increase Phentermine to 10m daily; increase protein intake with each meal; eat a minimum of three meals per day.  RTC one month  for weight check.  Continue goal of 1500kcal per day; continue to keep food diary via [email or web address].matthews.info/   3.  L sided colitis: stable; due for repeat colonoscopy by GCarlean Purl  S/p bariatric surgery consultation and considering sleeve; needs six months of medically guided weight loss program. 4.  Iron deficiency anemia; uncontrolled; restart iron supplement.  Repeat labs next visit. 5.  Vitamin D deficiency: uncontrolled; restart Vitamin D 50,000 weekly; repeat labs next visit. 6.  Depression:   Improved during the holidays with family.  Recommend starting Cymbalta 365mdaily now since oldest son will be leaving to return to college. 7.  OA B knees: persistent; warrants aggressive weight loss.   No orders of the defined types were placed in this encounter.   Meds ordered this encounter  Medications  . albuterol (PROVENTIL HFA;VENTOLIN HFA) 108 (90 Base) MCG/ACT inhaler    Sig: Inhale 2 puffs into the lungs every 6 (six) hours as needed for wheezing or shortness of breath. For shortness of breath    Dispense:  18 g    Refill:  2  . Iron-DSS-B12-FA-C-E-Cu-Biotin (HEMAX) 150-1 MG TABS    Sig: Take 1 tablet by mouth 2 (two) times daily.    Dispense:  60 tablet    Refill:  5  . Vitamin D, Ergocalciferol, (DRISDOL) 50000 units CAPS capsule    Sig: Take 1 capsule (50,000 Units total) by mouth every 7 (seven) days.    Dispense:  12 capsule    Refill:  0  . phentermine 30 MG capsule    Sig: Take 1 capsule (30 mg total) by mouth every morning.    Dispense:  30 capsule    Refill:  1    Return in about 4 weeks (around 10/28/2015) for recheck obesity.    Undine Nealis MaElayne GuerinM.D. [id number]Bryn [place]aw[place]way[place]  [phone] [phone]

## 2015-10-01 DIAGNOSIS — D509 Iron deficiency anemia, unspecified: Secondary | ICD-10-CM | POA: Insufficient documentation

## 2015-10-14 ENCOUNTER — Other Ambulatory Visit (HOSPITAL_COMMUNITY): Payer: Self-pay | Admitting: General Surgery

## 2015-11-01 ENCOUNTER — Ambulatory Visit: Payer: 59 | Admitting: Family Medicine

## 2015-11-05 ENCOUNTER — Encounter: Payer: Self-pay | Admitting: Family Medicine

## 2015-11-05 ENCOUNTER — Ambulatory Visit (INDEPENDENT_AMBULATORY_CARE_PROVIDER_SITE_OTHER): Payer: 59 | Admitting: Family Medicine

## 2015-11-05 VITALS — BP 123/84 | HR 80 | Temp 98.0°F | Resp 16 | Ht 66.5 in | Wt 303.0 lb

## 2015-11-05 DIAGNOSIS — Z131 Encounter for screening for diabetes mellitus: Secondary | ICD-10-CM

## 2015-11-05 DIAGNOSIS — E559 Vitamin D deficiency, unspecified: Secondary | ICD-10-CM

## 2015-11-05 DIAGNOSIS — F32A Depression, unspecified: Secondary | ICD-10-CM

## 2015-11-05 DIAGNOSIS — F419 Anxiety disorder, unspecified: Secondary | ICD-10-CM

## 2015-11-05 DIAGNOSIS — Z13220 Encounter for screening for lipoid disorders: Secondary | ICD-10-CM | POA: Diagnosis not present

## 2015-11-05 DIAGNOSIS — F418 Other specified anxiety disorders: Secondary | ICD-10-CM | POA: Diagnosis not present

## 2015-11-05 DIAGNOSIS — D509 Iron deficiency anemia, unspecified: Secondary | ICD-10-CM | POA: Diagnosis not present

## 2015-11-05 DIAGNOSIS — F329 Major depressive disorder, single episode, unspecified: Secondary | ICD-10-CM

## 2015-11-05 LAB — CBC WITH DIFFERENTIAL/PLATELET
Basophils Absolute: 0 10*3/uL (ref 0.0–0.1)
Basophils Relative: 0 % (ref 0–1)
EOS PCT: 2 % (ref 0–5)
Eosinophils Absolute: 0.3 10*3/uL (ref 0.0–0.7)
HEMATOCRIT: 33.2 % — AB (ref 36.0–46.0)
Hemoglobin: 10.1 g/dL — ABNORMAL LOW (ref 12.0–15.0)
LYMPHS ABS: 3.8 10*3/uL (ref 0.7–4.0)
LYMPHS PCT: 26 % (ref 12–46)
MCH: 23.2 pg — AB (ref 26.0–34.0)
MCHC: 30.4 g/dL (ref 30.0–36.0)
MCV: 76.3 fL — AB (ref 78.0–100.0)
MONO ABS: 0.6 10*3/uL (ref 0.1–1.0)
MPV: 10.4 fL (ref 8.6–12.4)
Monocytes Relative: 4 % (ref 3–12)
Neutro Abs: 9.9 10*3/uL — ABNORMAL HIGH (ref 1.7–7.7)
Neutrophils Relative %: 68 % (ref 43–77)
Platelets: 476 10*3/uL — ABNORMAL HIGH (ref 150–400)
RBC: 4.35 MIL/uL (ref 3.87–5.11)
RDW: 18.2 % — AB (ref 11.5–15.5)
WBC: 14.5 10*3/uL — ABNORMAL HIGH (ref 4.0–10.5)

## 2015-11-05 MED ORDER — PHENTERMINE HCL 37.5 MG PO CAPS: 37.5000 mg | ORAL_CAPSULE | ORAL | Status: DC
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
## 2015-11-05 NOTE — Patient Instructions (Signed)
Why follow it? Research shows. . Those who follow the Mediterranean diet have a reduced risk of heart disease  . The diet is associated with a reduced incidence of Parkinson's and Alzheimer's diseases . People following the diet may have longer life expectancies and lower rates of chronic diseases  . The Dietary Guidelines for Americans recommends the Mediterranean diet as an eating plan to promote health and prevent disease  What Is the Mediterranean Diet?  . Healthy eating plan based on typical foods and recipes of Mediterranean-style cooking . The diet is primarily a plant based diet; these foods should make up a majority of meals   Starches - Plant based foods should make up a majority of meals - They are an important sources of vitamins, minerals, energy, antioxidants, and fiber - Choose whole grains, foods high in fiber and minimally processed items  - Typical grain sources include wheat, oats, barley, corn, brown rice, bulgar, farro, millet, polenta, couscous  - Various types of beans include chickpeas, lentils, fava beans, black beans, white beans   Fruits  Veggies - Large quantities of antioxidant rich fruits & veggies; 6 or more servings  - Vegetables can be eaten raw or lightly drizzled with oil and cooked  - Vegetables common to the traditional Mediterranean Diet include: artichokes, arugula, beets, broccoli, brussel sprouts, cabbage, carrots, celery, collard greens, cucumbers, eggplant, kale, leeks, lemons, lettuce, mushrooms, okra, onions, peas, peppers, potatoes, pumpkin, radishes, rutabaga, shallots, spinach, sweet potatoes, turnips, zucchini - Fruits common to the Mediterranean Diet include: apples, apricots, avocados, cherries, clementines, dates, figs, grapefruits, grapes, melons, nectarines, oranges, peaches, pears, pomegranates, strawberries, tangerines  Fats - Replace butter and margarine with healthy oils, such as olive oil, canola oil, and tahini  - Limit nuts to no  more than a handful a day  - Nuts include walnuts, almonds, pecans, pistachios, pine nuts  - Limit or avoid candied, honey roasted or heavily salted nuts - Olives are central to the [employer] - can be eaten whole or used in a variety of dishes   Meats Protein - Limiting red meat: no more than a few times a month - When eating red meat: choose lean cuts and keep the portion to the size of deck of cards - Eggs: approx. 0 to 4 times a week  - Fish and lean poultry: at least 2 a week  - Healthy protein sources include, chicken, [place], lean beef, lamb - Increase intake of seafood such as tuna, salmon, trout, mackerel, shrimp, scallops - Avoid or limit high fat processed meats such as sausage and bacon  Dairy - Include moderate amounts of low fat dairy products  - Focus on healthy dairy such as fat free yogurt, skim milk, low or reduced fat cheese - Limit dairy products higher in fat such as whole or 2% milk, cheese, ice cream  Alcohol - Moderate amounts of red wine is ok  - No more than 5 oz daily for women (all ages) and men older than age 58  - No more than 10 oz of wine daily for men younger than 14  Other - Limit sweets and other desserts  - Use herbs and spices instead of salt to flavor foods  - Herbs and spices common to the traditional Mediterranean Diet include: basil, bay leaves, chives, cloves, cumin, fennel, garlic, lavender, marjoram, mint, oregano, parsley, pepper, rosemary, sage, savory, sumac, tarragon, thyme   It's not just a diet, it's a lifestyle:  . The Mediterranean diet includes  lifestyle factors typical of those in the region  . Foods, drinks and meals are best eaten with others and savored . Daily physical activity is important for overall good health . This could be strenuous exercise like running and aerobics . This could also be more leisurely activities such as walking, housework, yard-work, or taking the stairs . Moderation is the key; a balanced and  healthy diet accommodates most foods and drinks . Consider portion sizes and frequency of consumption of certain foods   Meal Ideas & Options:  . Breakfast:  o Whole wheat toast or whole wheat English muffins with peanut butter & hard boiled egg o Steel cut oats topped with apples & cinnamon and skim milk  o Fresh fruit: banana, strawberries, melon, berries, peaches  o Smoothies: strawberries, bananas, greek yogurt, peanut butter o Low fat greek yogurt with blueberries and granola  o Egg white omelet with spinach and mushrooms o Breakfast couscous: whole wheat couscous, apricots, skim milk, cranberries  . Sandwiches:  o Hummus and grilled vegetables (peppers, zucchini, squash) on whole wheat bread   o Grilled chicken on whole wheat pita with lettuce, tomatoes, cucumbers or tzatziki  o Tuna salad on whole wheat bread: tuna salad made with greek yogurt, olives, red peppers, capers, green onions o Garlic rosemary lamb pita: lamb sauted with garlic, rosemary, salt & pepper; add lettuce, cucumber, greek yogurt to pita - flavor with lemon juice and black pepper  . Seafood:  o Mediterranean grilled salmon, seasoned with garlic, basil, parsley, lemon juice and black pepper o Shrimp, lemon, and spinach whole-grain pasta salad made with low fat greek yogurt  o Seared scallops with lemon orzo  o Seared tuna steaks seasoned salt, pepper, coriander topped with tomato mixture of olives, tomatoes, olive oil, minced garlic, parsley, green onions and cappers  . Meats:  o Herbed greek chicken salad with kalamata olives, cucumber, feta  o Red bell peppers stuffed with spinach, bulgur, lean ground beef (or lentils) & topped with feta   o Kebabs: skewers of chicken, tomatoes, onions, zucchini, squash  o [place] burgers: made with red onions, mint, dill, lemon juice, feta cheese topped with roasted red peppers . Vegetarian o Cucumber salad: cucumbers, artichoke hearts, celery, red onion, feta cheese, tossed in  olive oil & lemon juice  o Hummus and whole grain pita points with a greek salad (lettuce, tomato, feta, olives, cucumbers, red onion) o Lentil soup with celery, carrots made with vegetable broth, garlic, salt and pepper  o Tabouli salad: parsley, bulgur, mint, scallions, cucumbers, tomato, radishes, lemon juice, olive oil, salt and pepper.

## 2015-11-06 LAB — COMPREHENSIVE METABOLIC PANEL
ALK PHOS: 88 U/L (ref 33–115)
ALT: 8 U/L (ref 6–29)
AST: 11 U/L (ref 10–30)
Albumin: 3.9 g/dL (ref 3.6–5.1)
BUN: 14 mg/dL (ref 7–25)
CALCIUM: 8.9 mg/dL (ref 8.6–10.2)
CHLORIDE: 101 mmol/L (ref 98–110)
CO2: 23 mmol/L (ref 20–31)
Creat: 0.95 mg/dL (ref 0.50–1.10)
GLUCOSE: 62 mg/dL — AB (ref 65–99)
POTASSIUM: 4.4 mmol/L (ref 3.5–5.3)
Sodium: 135 mmol/L (ref 135–146)
Total Bilirubin: 0.4 mg/dL (ref 0.2–1.2)
Total Protein: 7.5 g/dL (ref 6.1–8.1)

## 2015-11-06 LAB — IBC PANEL
%SAT: 8 % — AB (ref 11–50)
TIBC: 412 ug/dL (ref 250–450)
UIBC: 380 ug/dL (ref 125–400)

## 2015-11-06 LAB — LIPID PANEL
CHOL/HDL RATIO: 3.4 ratio (ref <=5.0)
Cholesterol: 178 mg/dL (ref 125–200)
HDL: 53 mg/dL (ref >=46)
LDL Cholesterol: 107 mg/dL (ref <130)
Triglycerides: 89 mg/dL (ref <150)
VLDL: 18 mg/dL (ref <30)

## 2015-11-06 LAB — IRON: Iron: 32 ug/dL — ABNORMAL LOW (ref 40–190)

## 2015-11-06 LAB — HEMOGLOBIN A1C
Hgb A1c MFr Bld: 5.8 % — ABNORMAL HIGH (ref <5.7)
MEAN PLASMA GLUCOSE: 120 mg/dL — AB (ref <117)

## 2015-11-06 LAB — VITAMIN D 25 HYDROXY (VIT D DEFICIENCY, FRACTURES): Vit D, 25-Hydroxy: 33 ng/mL (ref 30–100)

## 2015-11-07 ENCOUNTER — Ambulatory Visit (HOSPITAL_COMMUNITY)
Admission: RE | Admit: 2015-11-07 | Discharge: 2015-11-07 | Disposition: A | Discharge status: Home / Self Care | Payer: 59 | Source: Ambulatory Visit | Attending: General Surgery | Admitting: General Surgery

## 2015-11-07 DIAGNOSIS — M1990 Unspecified osteoarthritis, unspecified site: Secondary | ICD-10-CM | POA: Insufficient documentation

## 2015-11-07 DIAGNOSIS — Z01818 Encounter for other preprocedural examination: Secondary | ICD-10-CM | POA: Insufficient documentation

## 2015-11-18 ENCOUNTER — Encounter: Payer: 59 | Attending: General Surgery | Admitting: Skilled Nursing Facility1

## 2015-11-18 ENCOUNTER — Encounter: Payer: Self-pay | Admitting: Skilled Nursing Facility1

## 2015-11-18 DIAGNOSIS — Z6841 Body Mass Index (BMI) 40.0 and over, adult: Secondary | ICD-10-CM | POA: Diagnosis not present

## 2015-11-18 DIAGNOSIS — Z713 Dietary counseling and surveillance: Secondary | ICD-10-CM | POA: Insufficient documentation

## 2015-11-18 NOTE — Progress Notes (Signed)
  Pre-Op Assessment Visit:  Pre-Operative Sleeve Gastrectomy Surgery  Medical Nutrition Therapy:  Appt start time: 1500   End time:  1600.  Patient was seen on 11/18/2015 for Pre-Operative Nutrition Assessment. Assessment and letter of approval faxed to [facility] Bariatric Surgery Program coordinator on 11/18/2015. Pt states she may not need any more consecutive SWL due to prior properly documented visits with her PCP and orthopedist.   Preferred Learning Style:   No preference indicated   Learning Readiness:   Ready  Handouts given during visit include:  Pre-Op Goals Bariatric Surgery Protein Shakes   During the appointment today the following Pre-Op Goals were reviewed with the patient: Maintain or lose weight as instructed by your surgeon Make healthy food choices Begin to limit portion sizes Limited concentrated sugars and fried foods Keep fat/sugar in the single digits per serving on   food labels Practice CHEWING your food  (aim for 30 chews per bite or until applesauce consistency) Practice not drinking 15 minutes before, during, and 30 minutes after each meal/snack Avoid all carbonated beverages  Avoid/limit caffeinated beverages  Avoid all sugar-sweetened beverages Consume 3 meals per day; eat every 3-5 hours Make a list of non-food related activities Aim for 64-100 ounces of FLUID daily  Aim for at least 60-80 grams of PROTEIN daily Look for a liquid protein source that contain ?15 g protein and ?5 g carbohydrate  (ex: shakes, drinks, shots)  Patient-Centered Goals:  specific/non-scale and confidence/importance scale 1-10  Demonstrated degree of understanding via:  Teach Back  Teaching Method Utilized:  Visual Auditory Hands on  Barriers to learning/adherence to lifestyle change: none identified  Patient to call the [facility] to enroll in Pre-Op and Post-Op Nutrition Education when surgery date is scheduled.

## 2015-11-18 NOTE — Patient Instructions (Addendum)
Follow Pre-Op Goals Try Protein Shakes Call [facility] at [phone] when surgery is scheduled to enroll in Pre-Op Class  Things to remember:  Please always be honest with [place]. We want to support you!  If you have any questions or concerns in between appointments, please call or email Ferol Luz, or Margarita Grizzle.  The diet after surgery will be high protein and low in carbohydrate.  Vitamins and calcium need to be taken for the rest of your life.  Feel free to include support people in any classes or appointments.   Supplement recommendations:  Complete" Multivitamin: Sleeve Gastrectomy and RYGB patients take a double dose of MVI. LAGB patients take single dose as it is written on the package. Vitamin must be liquid or chewable but not gummy. Examples of these include Flintstones Complete and Centrum Complete. If the vitamin is bariatric-specific, take 1 dose as it is already formulated for bariatric surgery patients. Examples of these are Bariatric Advantage, Celebrate, and [employer]. These can be found at the [facility] and/or online.     Calcium citrate: 1500 mg/day of Calcium citrate (also chewable or liquid) is recommended for all procedures. The body is only able to absorb 500-600 mg of Calcium at one time so 3 daily doses of 500 mg are recommended. Calcium doses must be taken a minimum of 2 hours apart. Additionally, Calcium must be taken 2 hours apart from iron-containing MVI. Examples of brands include Celebrate, Bariatric Advantage, and Wellesse. These brands must be purchased online or at the [facility]. Citracal Petites is the only Calcium citrate supplement found in general grocery stores and pharmacies. This is in tablet form and may be recommended for patients who do not tolerate chewable Calcium.  Continued or added Vitamin D supplementation based on individual needs.    Vitamin B12: 300-500 mcg/day for Sleeve Gastrectomy and RYGB. Optional for  LAGB patients as stomach remains fully intact. Must be taken intramuscularly, sublingually, or inhaled nasally. Oral route is not recommended.    -Look up Drue Stager how to feed our children

## 2015-11-26 ENCOUNTER — Telehealth: Payer: Self-pay

## 2015-11-26 NOTE — Telephone Encounter (Signed)
Pt dropped off bariatric forms to be completed by provider   Best phone for pt is [phone]

## 2015-11-26 NOTE — Telephone Encounter (Signed)
I will place in your box.

## 2015-12-02 NOTE — Telephone Encounter (Signed)
Pt LM on my VM asking about status of form for [facility] for bariatric surgery. Called pt to advise status and she stated that insurance needs the form and also OV notes (several visits if possible) showing that weight loss/obesity was discussed to be faxed along with the form. Pt only has 6 weeks left of disability in order to have surgery covered by ins, so it is important that the form gets in asap, so that ins has time to review and then pt has to get on surgery schedule. I spoke to Dr Tamala Julian and she is aware that form is in her box for completion.

## 2015-12-03 ENCOUNTER — Encounter: Payer: Self-pay | Admitting: Family Medicine

## 2015-12-03 ENCOUNTER — Ambulatory Visit (INDEPENDENT_AMBULATORY_CARE_PROVIDER_SITE_OTHER): Payer: 59 | Admitting: Family Medicine

## 2015-12-03 ENCOUNTER — Ambulatory Visit (INDEPENDENT_AMBULATORY_CARE_PROVIDER_SITE_OTHER): Payer: 59

## 2015-12-03 VITALS — BP 124/85 | HR 98 | Temp 98.3°F | Resp 16 | Ht 66.5 in | Wt 297.0 lb

## 2015-12-03 DIAGNOSIS — J029 Acute pharyngitis, unspecified: Secondary | ICD-10-CM

## 2015-12-03 DIAGNOSIS — D509 Iron deficiency anemia, unspecified: Secondary | ICD-10-CM

## 2015-12-03 DIAGNOSIS — M25571 Pain in right ankle and joints of right foot: Secondary | ICD-10-CM

## 2015-12-03 DIAGNOSIS — E669 Obesity, unspecified: Secondary | ICD-10-CM

## 2015-12-03 DIAGNOSIS — S93401A Sprain of unspecified ligament of right ankle, initial encounter: Secondary | ICD-10-CM | POA: Diagnosis not present

## 2015-12-03 DIAGNOSIS — R42 Dizziness and giddiness: Secondary | ICD-10-CM | POA: Diagnosis not present

## 2015-12-03 LAB — CBC WITH DIFFERENTIAL/PLATELET
BASOS ABS: 0 10*3/uL (ref 0.0–0.1)
Basophils Relative: 0 % (ref 0–1)
EOS ABS: 0.3 10*3/uL (ref 0.0–0.7)
Eosinophils Relative: 2 % (ref 0–5)
HCT: 33 % — ABNORMAL LOW (ref 36.0–46.0)
HEMOGLOBIN: 10.5 g/dL — AB (ref 12.0–15.0)
LYMPHS ABS: 3.9 10*3/uL (ref 0.7–4.0)
Lymphocytes Relative: 28 % (ref 12–46)
MCH: 24.3 pg — ABNORMAL LOW (ref 26.0–34.0)
MCHC: 31.8 g/dL (ref 30.0–36.0)
MCV: 76.4 fL — AB (ref 78.0–100.0)
MONOS PCT: 4 % (ref 3–12)
MPV: 10.1 fL (ref 8.6–12.4)
Monocytes Absolute: 0.6 10*3/uL (ref 0.1–1.0)
Neutro Abs: 9.2 10*3/uL — ABNORMAL HIGH (ref 1.7–7.7)
Neutrophils Relative %: 66 % (ref 43–77)
PLATELETS: 560 10*3/uL — AB (ref 150–400)
RBC: 4.32 MIL/uL (ref 3.87–5.11)
RDW: 17.5 % — ABNORMAL HIGH (ref 11.5–15.5)
WBC: 13.9 10*3/uL — ABNORMAL HIGH (ref 4.0–10.5)

## 2015-12-03 LAB — POCT RAPID STREP A (OFFICE): RAPID STREP A SCREEN: NEGATIVE

## 2015-12-03 MED ORDER — DICLOFENAC SODIUM 75 MG PO TBEC: 75.0000 mg | DELAYED_RELEASE_TABLET | Freq: Two times a day (BID) | ORAL | Status: DC

## 2015-12-03 NOTE — Patient Instructions (Addendum)
Because you received labwork today, you will receive an invoice from [employer]. Please contact [facility] at [phone] with questions or concerns regarding your invoice. Our billing staff will not be able to assist you with those questions.  You will be contacted with the lab results as soon as they are available. The fastest way to get your results is to activate your My Chart account. Instructions are located on the last page of this paperwork. If you have not heard from [place] regarding the results in 2 weeks, please contact this office.  Because you received an x-ray today, you will receive an invoice from [facility]. Please contact [facility] at [phone] with questions or concerns regarding your invoice. Our billing staff will not be able to assist you with those questions.  Continue with diet and exercise.

## 2015-12-03 NOTE — Progress Notes (Signed)
Subjective:    Patient ID: [name], female    DOB: 1975-10-05, 40 y.o.   MRN: [id number]  12/03/2015  Follow-up; Obesity; and Ankle Injury   HPI This 40 y.o. female presents for follow-up of the following:   1. Obesity:  rx fro Phentermine 37.57m daily at last visit; in process of undergoing bariatric application for surgery.  Weight down five days ago.  Dropped off paperwork after visit last month; went to office and dropped off.  Did not specify who they needed to go to.  Did not complete two way form.  In order for her to send out to insurance company where has office visit notes where we discussed diet.  Also needs provider letter sent to bariatric surgery.  Put on schedule for surgery; hoping to put on surgery for 21st of this month.  On long term disability from work due to OA knee.  OAlvan Damehad taken out of work.  Trying to get in bariatric surgery before runs out.   Still must meet with Dr. HExcell Seltzerbefore surgery.  S/p initial consultation with [facility].   Taking Phentermine.   Totally changed diet.  Has changed diet to help liver shrink. Protein shakes have increased. B:  Bagel with egg (rarely); trying to decrease bagel due to bread; eating salad for breakfast.  Eats yogurt though does not like it.  Water Snack: PB because filling; eating whole wheat crackers, tuna, water. Lunch: salad with tuna, fat free ranch.  [place] spray bottle. Snack:  Rarely hungry in afternoon.   Supper:  Protein shake Snack: ice  2.  Sore throat:  Onset today.  No fever/chills/sweats.  +HA today and chronic.  No ear pain but +ringing in ear B.  Mild pain in R ear.  Dizzy also yesterday with ringing in ears.  No rhinorrhea; no nasal congestion; no cough.  No n/v/d.    3. R ankle injury: fell and injured ankle at home; having swelling and ankle pain; painful weight bearing; less painful; injured one week ago; denies n/t in foot or ankle; no calf swelling.     Review of Systems    Constitutional: Negative for fever, chills, diaphoresis and fatigue.  HENT: Positive for ear pain, sore throat and tinnitus. Negative for congestion and rhinorrhea.   Eyes: Negative for visual disturbance.  Respiratory: Negative for cough and shortness of breath.   Cardiovascular: Negative for chest pain, palpitations and leg swelling.  Gastrointestinal: Negative for nausea, vomiting, abdominal pain, diarrhea and constipation.  Endocrine: Negative for cold intolerance, heat intolerance, polydipsia, polyphagia and polyuria.  Musculoskeletal: Positive for joint swelling and arthralgias.  Neurological: Negative for dizziness, tremors, seizures, syncope, facial asymmetry, speech difficulty, weakness, light-headedness, numbness and headaches.  Psychiatric/Behavioral: The patient is not nervous/anxious.     Past Medical History  Diagnosis Date  . Bronchitis   . Asthma   . Clostridium difficile infection 04/20/2012  . Obesity   . Osteoarthritis     Both knees  . Shortness of breath     on exertion  . Internal and external bleeding hemorrhoids 06/11/2014  . Left sided chronic colitis - segmental 06/11/2014  . TOBACCO USER 10/02/2009    Qualifier: Diagnosis of  By: IDimas MillinMD, EEllard Artis   . Anal fissure - posterior 10/16/2014  . IDA (iron deficiency anemia)   . Ulcerative colitis ([facility]    Past Surgical History  Procedure Laterality Date  . Hemrrhoid surgery    . Colonoscopy  2007  for rectal bleeding; [facility]  . Laparoscopic tubal ligation  10/16/2011    Procedure: LAPAROSCOPIC TUBAL LIGATION;  Surgeon: Alwyn Pea, MD;  Location: [facility];  Service: Gynecology;  Laterality: Bilateral;  . Foot surgery    . Tubal ligation    . Colonoscopy N/A 06/11/2014    Procedure: COLONOSCOPY;  Surgeon: Gatha Mayer, MD;  Location: [facility];  Service: Endoscopy;  Laterality: N/A;  . Knee arthroscopy Left 09/06/2014  . Novasure ablation  09/28/2010    mild persistent vaginal bleeding   Allergies   Allergen Reactions  . Oxycodone Anaphylaxis  . Ciprofloxacin Itching and Rash  . Doxycycline Nausea And Vomiting  . Penicillins Rash   Current Outpatient Prescriptions  Medication Sig Dispense Refill  . albuterol (PROVENTIL HFA;VENTOLIN HFA) 108 (90 Base) MCG/ACT inhaler Inhale 2 puffs into the lungs every 6 (six) hours as needed for wheezing or shortness of breath. For shortness of breath 18 g 2  . ibuprofen (ADVIL,MOTRIN) 200 MG tablet Take 400 mg by mouth every 6 (six) hours as needed for headache. Reported on 11/05/2015    . ipratropium (ATROVENT) 0.03 % nasal spray Place 2 sprays into both nostrils 2 (two) times daily. (Patient taking differently: Place 2 sprays into both nostrils 2 (two) times daily as needed for rhinitis. ) 30 mL 0  . mesalamine (CANASA) 1000 MG suppository Place 1 suppository (1,000 mg total) rectally at bedtime. 9 suppository 0  . phentermine 37.5 MG capsule Take 1 capsule (37.5 mg total) by mouth every morning. 30 capsule 1  . Vitamin D, Ergocalciferol, (DRISDOL) 50000 units CAPS capsule Take 1 capsule (50,000 Units total) by mouth every 7 (seven) days. 12 capsule 0  . diclofenac (VOLTAREN) 75 MG EC tablet Take 1 tablet (75 mg total) by mouth 2 (two) times daily. 30 tablet 0   No current facility-administered medications for this visit.   Social History   Social History  . Marital Status: Single    Spouse Name: N/A  . Number of Children: N/A  . Years of Education: N/A   Occupational History  . Not on file.   Social History Main Topics  . Smoking status: Former Smoker -- 0.50 packs/day for 20 years    Types: Cigarettes    Quit date: 03/17/2014  . Smokeless tobacco: Never Used  . Alcohol Use: No  . Drug Use: No  . Sexual Activity: Yes    Birth Control/ Protection: Condom   Other Topics Concern  . Not on file   Social History Narrative   Marital status:  Single; not dating seriously.      Children:  3 sons (25, 2, 61), 1 daughter (deceased); no  grandchildren      Lives:  With 2 sons      Employment:  Full time; Corporate treasurer at [employer]      Tobacco: quit June 2015      Alcohol:  None      Drugs:  None      Exercise:  Leg lifts for knee strengthening; walking   1 caffeine beverage daily      Sexual activity:  Active; total partners = up there.  No STDs in past.        Family History  Problem Relation Age of Onset  . Hypertension Mother   . Diabetes Mother   . Sarcoidosis Mother     lungs and skin  . Asthma Mother   . Hypertension Father   . Diabetes  Father   . Asthma Son   . Asthma Sister   . Cancer Sister     possible pancreatic cancer  . Asthma Brother   . Asthma Daughter     died age 9.5  . Cancer Daughter 4    brain; died age 9.5  . Asthma Son        Objective:    BP 124/85 mmHg  Pulse 98  Temp(Src) 98.3 F (36.8 C)  Resp 16  Ht 5' 6.5" (1.689 m)  Wt 297 lb (134.718 kg)  BMI 47.22 kg/m2 Physical Exam  Constitutional: She is oriented to person, place, and time. She appears well-developed and well-nourished. No distress.  HENT:  Head: Normocephalic and atraumatic.  Right Ear: Tympanic membrane, external ear and ear canal normal.  Left Ear: Tympanic membrane, external ear and ear canal normal.  Nose: Nose normal.  Mouth/Throat: Uvula is midline and mucous membranes are normal. Posterior oropharyngeal erythema present.  Eyes: Conjunctivae and EOM are normal. Pupils are equal, round, and reactive to light.  Neck: Normal range of motion. Neck supple. Carotid bruit is not present. No thyromegaly present.  Cardiovascular: Normal rate, regular rhythm, normal heart sounds and intact distal pulses.  Exam reveals no gallop and no friction rub.   No murmur heard. Pulmonary/Chest: Effort normal and breath sounds normal. She has no wheezes. She has no rales.  Abdominal: Soft. Bowel sounds are normal. She exhibits no distension and no mass. There is no tenderness. There is no rebound and no guarding.    Musculoskeletal:       Right ankle: She exhibits decreased range of motion and swelling. Tenderness. Lateral malleolus and medial malleolus tenderness found. No head of 5th metatarsal tenderness found. Achilles tendon normal. Achilles tendon exhibits normal Thompson's test results.       Right foot: Normal.  Lymphadenopathy:    She has no cervical adenopathy.  Neurological: She is alert and oriented to person, place, and time. No cranial nerve deficit.  Skin: Skin is warm and dry. No rash noted. She is not diaphoretic. No erythema. No pallor.  Psychiatric: She has a normal mood and affect. Her behavior is normal.        Assessment & Plan:   1. Right ankle pain   2. Sore throat   3. Obesity   4. Dizziness   5. Anemia, iron deficiency   6. Right ankle sprain, initial encounter     1. R ankle pain/strain:  New. Recommend rest/elevation/icing/NSAIDs scheduled.  If no improvement in one week, refer to ortho. ASO ankle brace fitted. 2.  Sore throat: New. Send cx.  Supportive care with rest, fluids, Ibuprofen or Tylenol. 3.  Morbid obesity: improving with Phentermine and increasing protein intake; continue to have 1500kcal goal per day; unable to exercise due to knee pain/issues.   4.  Dizziness: New. Obtain labs; normal neurological exam in office. 5.  Anemia iron deficiency: stable; obtain labs; continue supplement.   Orders Placed This Encounter  Procedures  . Culture, Group A Strep    Order Specific Question:  Source    Answer:  oropharynx  . DG Ankle Complete Right    Standing Status: Future     Number of Occurrences: 1     Standing Expiration Date: 12/02/2016    Order Specific Question:  Reason for Exam (SYMPTOM  OR DIAGNOSIS REQUIRED)    Answer:  R anterior-medial ankle pain; twisted ankle five days ago    Order Specific Question:  Is the  patient pregnant?    Answer:  No    Order Specific Question:  Preferred imaging location?    Answer:  External  . CBC with  Differential/Platelet  . Comprehensive metabolic panel  . Iron  . Apply ASO ankle    Scheduling Instructions:     RIGHT ANNKLE  . POCT rapid strep A   Meds ordered this encounter  Medications  . diclofenac (VOLTAREN) 75 MG EC tablet    Sig: Take 1 tablet (75 mg total) by mouth 2 (two) times daily.    Dispense:  30 tablet    Refill:  0    Return in about 4 weeks (around 12/31/2015) for recheck obesity.    Yovana Scogin Elayne Guerin, M.D. [facility] [address] [place], [place]  [id number] [phone] phone [phone] fax

## 2015-12-04 ENCOUNTER — Ambulatory Visit: Payer: 59 | Admitting: Dietician

## 2015-12-04 LAB — COMPREHENSIVE METABOLIC PANEL
ALBUMIN: 3.9 g/dL (ref 3.6–5.1)
ALK PHOS: 93 U/L (ref 33–115)
ALT: 8 U/L (ref 6–29)
AST: 14 U/L (ref 10–30)
BUN: 10 mg/dL (ref 7–25)
CO2: 23 mmol/L (ref 20–31)
CREATININE: 0.9 mg/dL (ref 0.50–1.10)
Calcium: 9.7 mg/dL (ref 8.6–10.2)
Chloride: 101 mmol/L (ref 98–110)
Glucose, Bld: 71 mg/dL (ref 65–99)
POTASSIUM: 4.3 mmol/L (ref 3.5–5.3)
SODIUM: 135 mmol/L (ref 135–146)
TOTAL PROTEIN: 7.7 g/dL (ref 6.1–8.1)
Total Bilirubin: 0.3 mg/dL (ref 0.2–1.2)

## 2015-12-04 LAB — IRON: IRON: 21 ug/dL — AB (ref 40–190)

## 2015-12-05 LAB — CULTURE, GROUP A STREP: ORGANISM ID, BACTERIA: NORMAL

## 2015-12-16 NOTE — Telephone Encounter (Signed)
Forms completed; letter of medical necessity created; to be faxed to [facility].

## 2015-12-17 NOTE — Telephone Encounter (Signed)
Done, pt called to pick up her copy.

## 2016-01-06 ENCOUNTER — Encounter: Payer: 59 | Attending: General Surgery

## 2016-01-06 DIAGNOSIS — Z6841 Body Mass Index (BMI) 40.0 and over, adult: Secondary | ICD-10-CM | POA: Diagnosis not present

## 2016-01-06 DIAGNOSIS — M170 Bilateral primary osteoarthritis of knee: Secondary | ICD-10-CM | POA: Insufficient documentation

## 2016-01-06 NOTE — Patient Instructions (Addendum)
NYELAH EMMERICH  01/06/2016   Your procedure is scheduled on: Tuesday 01/14/2016  Report to [facility] Main  Entrance take [place]  elevators to 3rd floor to  [place] at  [place] AM.  Call this number if you have problems the morning of surgery [phone]   Remember: ONLY 1 PERSON MAY GO WITH YOU TO SHORT STAY TO GET  READY MORNING OF [place].   Do not eat food or drink liquids :After Midnight.     Take these medicines the morning of surgery with A SIP OF WATER: use Flonase nasal spray, use Albuterol inhaler if needed and bring with you to hospital                                 You may not have any metal on your body including hair pins and              piercings  Do not wear jewelry, make-up, lotions, powders or perfumes, deodorant             Do not wear nail polish.  Do not shave  48 hours prior to surgery.              Men may shave face and neck.   Do not bring valuables to the hospital. [place].  Contacts, dentures or bridgework may not be worn into surgery.  Leave suitcase in the car. After surgery it may be brought to your room.                  Please read over the following fact sheets you were given: Incentive Spirometer _____________________________________________________________________             [facility] - Preparing for Surgery Before surgery, you can play an important role.  Because skin is not sterile, your skin needs to be as free of germs as possible.  You can reduce the number of germs on your skin by washing with CHG (chlorahexidine gluconate) soap before surgery.  CHG is an antiseptic cleaner which kills germs and bonds with the skin to continue killing germs even after washing. Please DO NOT use if you have an allergy to CHG or antibacterial soaps.  If your skin becomes reddened/irritated stop using the CHG and inform your nurse when you arrive at Short Stay. Do  not shave (including legs and underarms) for at least 48 hours prior to the first CHG shower.  You may shave your face/neck. Please follow these instructions carefully:  1.  Shower with CHG Soap the night before surgery and the  morning of Surgery.  2.  If you choose to wash your hair, wash your hair first as usual with your  normal  shampoo.  3.  After you shampoo, rinse your hair and body thoroughly to remove the  shampoo.                           4.  Use CHG as you would any other liquid soap.  You can apply chg directly  to the skin and wash  Gently with a scrungie or clean washcloth.  5.  Apply the CHG Soap to your body ONLY FROM THE NECK DOWN.   Do not use on face/ open                           Wound or open sores. Avoid contact with eyes, ears mouth and genitals (private parts).                       Wash face,  Genitals (private parts) with your normal soap.             6.  Wash thoroughly, paying special attention to the area where your surgery  will be performed.  7.  Thoroughly rinse your body with warm water from the neck down.  8.  DO NOT shower/wash with your normal soap after using and rinsing off  the CHG Soap.                9.  Pat yourself dry with a clean towel.            10.  Wear clean pajamas.            11.  Place clean sheets on your bed the night of your first shower and do not  sleep with pets. Day of Surgery : Do not apply any lotions/deodorants the morning of surgery.  Please wear clean clothes to the hospital/surgery center.  FAILURE TO FOLLOW THESE INSTRUCTIONS MAY RESULT IN THE CANCELLATION OF YOUR SURGERY PATIENT SIGNATURE_________________________________  NURSE SIGNATURE__________________________________  ________________________________________________________________________   [name]  An incentive spirometer is a tool that can help keep your lungs clear and active. This tool measures how well you are filling your  lungs with each breath. Taking long deep breaths may help reverse or decrease the chance of developing breathing (pulmonary) problems (especially infection) following:  A long period of time when you are unable to move or be active. BEFORE THE PROCEDURE   If the spirometer includes an indicator to show your best effort, your nurse or respiratory therapist will set it to a desired goal.  If possible, sit up straight or lean slightly forward. Try not to slouch.  Hold the incentive spirometer in an upright position. INSTRUCTIONS FOR USE  1. Sit on the edge of your bed if possible, or sit up as far as you can in bed or on a chair. 2. Hold the incentive spirometer in an upright position. 3. Breathe out normally. 4. Place the mouthpiece in your mouth and seal your lips tightly around it. 5. Breathe in slowly and as deeply as possible, raising the piston or the ball toward the top of the column. 6. Hold your breath for 3-5 seconds or for as long as possible. Allow the piston or ball to fall to the bottom of the column. 7. Remove the mouthpiece from your mouth and breathe out normally. 8. Rest for a few seconds and repeat Steps 1 through 7 at least 10 times every 1-2 hours when you are awake. Take your time and take a few normal breaths between deep breaths. 9. The spirometer may include an indicator to show your best effort. Use the indicator as a goal to work toward during each repetition. 10. After each set of 10 deep breaths, practice coughing to be sure your lungs are clear. If you have an incision (the cut made at the time of surgery),  support your incision when coughing by placing a pillow or rolled up towels firmly against it. Once you are able to get out of bed, walk around indoors and cough well. You may stop using the incentive spirometer when instructed by your caregiver.  RISKS AND COMPLICATIONS  Take your time so you do not get dizzy or light-headed.  If you are in pain, you may need  to take or ask for pain medication before doing incentive spirometry. It is harder to take a deep breath if you are having pain. AFTER USE  Rest and breathe slowly and easily.  It can be helpful to keep track of a log of your progress. Your caregiver can provide you with a simple table to help with this. If you are using the spirometer at home, follow these instructions: [place] IF:   You are having difficultly using the spirometer.  You have trouble using the spirometer as often as instructed.  Your pain medication is not giving enough relief while using the spirometer.  You develop fever of 100.5 F (38.1 C) or higher. SEEK IMMEDIATE MEDICAL CARE IF:   You cough up bloody sputum that had not been present before.  You develop fever of 102 F (38.9 C) or greater.  You develop worsening pain at or near the incision site. MAKE SURE YOU:   Understand these instructions.  Will watch your condition.  Will get help right away if you are not doing well or get worse. Document Released: 01/25/2007 Document Revised: 12/07/2011 Document Reviewed: 03/28/2007 [facility] Patient Information 2014 [place], [place].   ________________________________________________________________________

## 2016-01-06 NOTE — Progress Notes (Signed)
  Pre-Operative Nutrition Class:  Appt start time: 0131   End time:  1830.  Patient was seen on 01/06/2016 for Pre-Operative Bariatric Surgery [facility]   Surgery date: 01/14/2016 Surgery type: sleeve gastrectomy Start weight at [facility]: 301 lbs on 11/18/2015 Weight today: 302 lbs  TANITA  BODY COMP RESULTS  01/06/16   BMI (kg/m^2) 47.3   Fat Mass (lbs) 171   Fat Free Mass (lbs) 131   Total Body Water (lbs) 96   Samples given per MNT protocol. Patient educated on appropriate usage: Premier protein shake (vanilla - qty 1) Lot #: [id number] Exp: 08/2016  Bariatric Advantage Calcium Citrate chew (tropical orange - qty 1) Lot #: [id number] Exp: 04/2016  Celebrate Vitamins Multivitamin (pineapple strawberry - qty 1) Lot #: [id number] Exp: 08/2016  Renee Pain Protein Powder (unflavored - qty 1) Lot #: [id number] Exp: 03/2017  The following the learning objectives were met by the patient during this course:  Identify Pre-Op Dietary Goals and will begin 2 weeks pre-operatively  Identify appropriate sources of fluids and proteins   State protein recommendations and appropriate sources pre and post-operatively  Identify Post-Operative Dietary Goals and will follow for 2 weeks post-operatively  Identify appropriate multivitamin and calcium sources  Describe the need for physical activity post-operatively and will follow MD recommendations  State when to call healthcare provider regarding medication questions or post-operative complications  Handouts given during class include:  Pre-Op Bariatric Surgery Diet Handout  Protein Shake Handout  Post-Op Bariatric Surgery Nutrition Handout  BELT Program Information Flyer  Support Group Information Flyer  [facility] Bariatric Supplements Price List  Follow-Up Plan: Patient will follow-up at [facility] 2 weeks post operatively for diet advancement per MD.

## 2016-01-07 ENCOUNTER — Other Ambulatory Visit: Payer: Self-pay | Admitting: General Surgery

## 2016-01-07 ENCOUNTER — Ambulatory Visit: Payer: 59 | Admitting: Family Medicine

## 2016-01-08 ENCOUNTER — Encounter (HOSPITAL_COMMUNITY): Payer: Self-pay

## 2016-01-08 ENCOUNTER — Encounter (HOSPITAL_COMMUNITY)
Admission: RE | Admit: 2016-01-08 | Discharge: 2016-01-08 | Disposition: A | Discharge status: Home / Self Care | Payer: 59 | Source: Ambulatory Visit | Attending: General Surgery | Admitting: General Surgery

## 2016-01-08 DIAGNOSIS — Z01812 Encounter for preprocedural laboratory examination: Secondary | ICD-10-CM | POA: Insufficient documentation

## 2016-01-08 LAB — COMPREHENSIVE METABOLIC PANEL
ALT: 11 U/L — ABNORMAL LOW (ref 14–54)
ANION GAP: 6 (ref 5–15)
AST: 13 U/L — AB (ref 15–41)
Albumin: 3.6 g/dL (ref 3.5–5.0)
Alkaline Phosphatase: 88 U/L (ref 38–126)
BILIRUBIN TOTAL: 0.3 mg/dL (ref 0.3–1.2)
BUN: 14 mg/dL (ref 6–20)
CHLORIDE: 109 mmol/L (ref 101–111)
CO2: 25 mmol/L (ref 22–32)
Calcium: 9.3 mg/dL (ref 8.9–10.3)
Creatinine, Ser: 0.9 mg/dL (ref 0.44–1.00)
Glucose, Bld: 86 mg/dL (ref 65–99)
POTASSIUM: 4.5 mmol/L (ref 3.5–5.1)
Sodium: 140 mmol/L (ref 135–145)
TOTAL PROTEIN: 8 g/dL (ref 6.5–8.1)

## 2016-01-08 LAB — CBC WITH DIFFERENTIAL/PLATELET
BASOS ABS: 0 10*3/uL (ref 0.0–0.1)
BASOS PCT: 0 %
EOS PCT: 2 %
Eosinophils Absolute: 0.2 10*3/uL (ref 0.0–0.7)
HCT: 29.4 % — ABNORMAL LOW (ref 36.0–46.0)
Hemoglobin: 8.8 g/dL — ABNORMAL LOW (ref 12.0–15.0)
Lymphocytes Relative: 24 %
Lymphs Abs: 2.5 10*3/uL (ref 0.7–4.0)
MCH: 22.7 pg — ABNORMAL LOW (ref 26.0–34.0)
MCHC: 29.9 g/dL — AB (ref 30.0–36.0)
MCV: 75.8 fL — AB (ref 78.0–100.0)
MONO ABS: 0.5 10*3/uL (ref 0.1–1.0)
MONOS PCT: 5 %
Neutro Abs: 7.2 10*3/uL (ref 1.7–7.7)
Neutrophils Relative %: 69 %
PLATELETS: 476 10*3/uL — AB (ref 150–400)
RBC: 3.88 MIL/uL (ref 3.87–5.11)
RDW: 16.6 % — AB (ref 11.5–15.5)
WBC: 10.4 10*3/uL (ref 4.0–10.5)

## 2016-01-08 LAB — HCG, SERUM, QUALITATIVE: PREG SERUM: NEGATIVE

## 2016-01-08 NOTE — Progress Notes (Signed)
Called Triage at [place] and talked to [facility] in Triage and informed her that lab results from CBC w/ diff. were Faxed to Dr. Lear Ng in basket.

## 2016-01-08 NOTE — Progress Notes (Signed)
11/07/2015-noted in EPIC- EKG and Chest 2 view.

## 2016-01-13 MED ORDER — GENTAMICIN SULFATE 40 MG/ML IJ SOLN
5.0000 mg/kg | Freq: Once | INTRAVENOUS | Status: DC
  Filled 2016-01-13: qty 11.25

## 2016-01-13 NOTE — Anesthesia Preprocedure Evaluation (Addendum)
Anesthesia Evaluation  Patient identified by MRN, date of birth, ID band Patient awake    Reviewed: Allergy & Precautions, NPO status , Patient's Chart, lab work & pertinent test results  History of Anesthesia Complications Negative for: history of anesthetic complications  Airway Mallampati: III  TM Distance: >3 FB Neck ROM: Full    Dental no notable dental hx. (+) Dental Advisory Given, Chipped, Missing, Poor Dentition   Pulmonary asthma , former smoker,    Pulmonary exam normal breath sounds clear to auscultation       Cardiovascular negative cardio ROS Normal cardiovascular exam Rhythm:Regular Rate:Normal     Neuro/Psych PSYCHIATRIC DISORDERS Anxiety Depression negative neurological ROS     GI/Hepatic Neg liver ROS, Ulcerative colitis   Endo/Other  Morbid obesity  Renal/GU negative Renal ROS  negative genitourinary   Musculoskeletal  (+) Arthritis , Osteoarthritis,    Abdominal (+) + obese,   Peds negative pediatric ROS (+)  Hematology  (+) anemia ,   Anesthesia Other Findings   Reproductive/Obstetrics negative OB ROS                            Anesthesia Physical Anesthesia Plan  ASA: III  Anesthesia Plan: General   Post-op Pain Management:    Induction: Intravenous  Airway Management Planned: Oral ETT  Additional Equipment:   Intra-op Plan:   Post-operative Plan: Extubation in OR  Informed Consent: I have reviewed the patients History and Physical, chart, labs and discussed the procedure including the risks, benefits and alternatives for the proposed anesthesia with the patient or authorized representative who has indicated his/her understanding and acceptance.   Dental advisory given  Plan Discussed with: CRNA  Anesthesia Plan Comments:         Anesthesia Quick Evaluation

## 2016-01-14 ENCOUNTER — Inpatient Hospital Stay (HOSPITAL_COMMUNITY): Payer: 59 | Admitting: Anesthesiology

## 2016-01-14 ENCOUNTER — Encounter (HOSPITAL_COMMUNITY): Payer: Self-pay

## 2016-01-14 ENCOUNTER — Encounter (HOSPITAL_COMMUNITY)
Admission: RE | Disposition: A | Discharge status: Home / Self Care | Payer: Self-pay | Source: Ambulatory Visit | Attending: General Surgery

## 2016-01-14 ENCOUNTER — Inpatient Hospital Stay (HOSPITAL_COMMUNITY)
Admission: RE | Admit: 2016-01-14 | Discharge: 2016-01-19 | DRG: 620 | Disposition: A | Discharge status: Home / Self Care | Payer: 59 | Source: Ambulatory Visit | Attending: General Surgery | Admitting: General Surgery

## 2016-01-14 DIAGNOSIS — K449 Diaphragmatic hernia without obstruction or gangrene: Secondary | ICD-10-CM | POA: Diagnosis present

## 2016-01-14 DIAGNOSIS — Z01812 Encounter for preprocedural laboratory examination: Secondary | ICD-10-CM

## 2016-01-14 DIAGNOSIS — Z79899 Other long term (current) drug therapy: Secondary | ICD-10-CM | POA: Diagnosis not present

## 2016-01-14 DIAGNOSIS — G8929 Other chronic pain: Secondary | ICD-10-CM | POA: Diagnosis present

## 2016-01-14 DIAGNOSIS — D500 Iron deficiency anemia secondary to blood loss (chronic): Secondary | ICD-10-CM | POA: Diagnosis present

## 2016-01-14 DIAGNOSIS — K910 Vomiting following gastrointestinal surgery: Secondary | ICD-10-CM | POA: Diagnosis not present

## 2016-01-14 DIAGNOSIS — M170 Bilateral primary osteoarthritis of knee: Secondary | ICD-10-CM | POA: Diagnosis present

## 2016-01-14 DIAGNOSIS — Z6841 Body Mass Index (BMI) 40.0 and over, adult: Secondary | ICD-10-CM

## 2016-01-14 DIAGNOSIS — Z9884 Bariatric surgery status: Secondary | ICD-10-CM

## 2016-01-14 DIAGNOSIS — Z87891 Personal history of nicotine dependence: Secondary | ICD-10-CM | POA: Diagnosis not present

## 2016-01-14 DIAGNOSIS — K5190 Ulcerative colitis, unspecified, without complications: Secondary | ICD-10-CM | POA: Diagnosis present

## 2016-01-14 HISTORY — PX: LAPAROSCOPIC GASTRIC SLEEVE RESECTION: SHX5895

## 2016-01-14 HISTORY — DX: Motion sickness, initial encounter: T75.3XXA

## 2016-01-14 LAB — HEMOGLOBIN AND HEMATOCRIT, BLOOD
HEMATOCRIT: 29.6 % — AB (ref 36.0–46.0)
HEMOGLOBIN: 9.1 g/dL — AB (ref 12.0–15.0)

## 2016-01-14 SURGERY — GASTRECTOMY, SLEEVE, LAPAROSCOPIC
Anesthesia: General | Site: Abdomen

## 2016-01-14 MED ORDER — PROPOFOL 10 MG/ML IV BOLUS
INTRAVENOUS | Status: AC
  Filled 2016-01-14: qty 20

## 2016-01-14 MED ORDER — BUPIVACAINE LIPOSOME 1.3 % IJ SUSP: 40.0000 mL | Freq: Once | INTRAMUSCULAR | Status: DC

## 2016-01-14 MED ORDER — ALBUTEROL SULFATE (2.5 MG/3ML) 0.083% IN NEBU: 2.5000 mg | INHALATION_SOLUTION | Freq: Four times a day (QID) | RESPIRATORY_TRACT | Status: DC | PRN

## 2016-01-14 MED ORDER — EVICEL 5 ML EX KIT
PACK | Freq: Once | CUTANEOUS | Status: AC
  Administered 2016-01-14: 1
  Filled 2016-01-14: qty 1

## 2016-01-14 MED ORDER — PROMETHAZINE HCL 25 MG/ML IJ SOLN
6.2500 mg | Freq: Once | INTRAMUSCULAR | Status: AC
  Administered 2016-01-14: 6.25 mg via INTRAVENOUS
  Filled 2016-01-14: qty 1

## 2016-01-14 MED ORDER — PROPOFOL 10 MG/ML IV BOLUS
INTRAVENOUS | Status: DC | PRN
  Administered 2016-01-14: 200 mg via INTRAVENOUS

## 2016-01-14 MED ORDER — DEXTROSE IN LACTATED RINGERS 5 % IV SOLN
INTRAVENOUS | Status: DC
  Administered 2016-01-14 – 2016-01-19 (×6): via INTRAVENOUS

## 2016-01-14 MED ORDER — BUPIVACAINE LIPOSOME 1.3 % IJ SUSP
20.0000 mL | Freq: Once | INTRAMUSCULAR | Status: AC
  Administered 2016-01-14: 70 mL
  Filled 2016-01-14: qty 20

## 2016-01-14 MED ORDER — SODIUM CHLORIDE 0.9 % IJ SOLN
INTRAMUSCULAR | Status: AC
  Filled 2016-01-14: qty 50

## 2016-01-14 MED ORDER — PROCHLORPERAZINE EDISYLATE 5 MG/ML IJ SOLN
5.0000 mg | INTRAMUSCULAR | Status: DC | PRN
  Administered 2016-01-14 – 2016-01-16 (×4): 5 mg via INTRAVENOUS
  Filled 2016-01-14 (×4): qty 2

## 2016-01-14 MED ORDER — BUPIVACAINE-EPINEPHRINE 0.25% -1:200000 IJ SOLN
INTRAMUSCULAR | Status: DC | PRN
  Administered 2016-01-14: 10 mL

## 2016-01-14 MED ORDER — PREMIER PROTEIN SHAKE
2.0000 [oz_av] | Freq: Four times a day (QID) | ORAL | Status: DC
  Administered 2016-01-16 – 2016-01-18 (×13): 2 [oz_av] via ORAL

## 2016-01-14 MED ORDER — SODIUM CHLORIDE 0.9 % IJ SOLN
INTRAMUSCULAR | Status: AC
  Filled 2016-01-14: qty 10

## 2016-01-14 MED ORDER — ROCURONIUM BROMIDE 100 MG/10ML IV SOLN
INTRAVENOUS | Status: DC | PRN
  Administered 2016-01-14: 10 mg via INTRAVENOUS
  Administered 2016-01-14: 50 mg via INTRAVENOUS

## 2016-01-14 MED ORDER — ENOXAPARIN SODIUM 30 MG/0.3ML ~~LOC~~ SOLN
30.0000 mg | Freq: Two times a day (BID) | SUBCUTANEOUS | Status: DC
  Administered 2016-01-14 – 2016-01-19 (×10): 30 mg via SUBCUTANEOUS
  Filled 2016-01-14 (×11): qty 0.3

## 2016-01-14 MED ORDER — SCOPOLAMINE 1 MG/3DAYS TD PT72
MEDICATED_PATCH | TRANSDERMAL | Status: DC | PRN
  Administered 2016-01-14: 1 via TRANSDERMAL

## 2016-01-14 MED ORDER — FLUTICASONE PROPIONATE 50 MCG/ACT NA SUSP
1.0000 | Freq: Every day | NASAL | Status: DC
  Filled 2016-01-14: qty 16

## 2016-01-14 MED ORDER — HYDROMORPHONE HCL 1 MG/ML PO LIQD
1.0000 mg | ORAL | Status: DC | PRN
  Administered 2016-01-16 – 2016-01-18 (×11): 1 mg via ORAL
  Administered 2016-01-19 (×3): 2 mg via ORAL
  Filled 2016-01-14: qty 1
  Filled 2016-01-14 (×2): qty 2
  Filled 2016-01-14 (×8): qty 1
  Filled 2016-01-14 (×2): qty 2
  Filled 2016-01-14: qty 1
  Filled 2016-01-14: qty 2

## 2016-01-14 MED ORDER — ONDANSETRON HCL 4 MG/2ML IJ SOLN
INTRAMUSCULAR | Status: AC
  Filled 2016-01-14: qty 2

## 2016-01-14 MED ORDER — DEXAMETHASONE SODIUM PHOSPHATE 10 MG/ML IJ SOLN
INTRAMUSCULAR | Status: AC
  Filled 2016-01-14: qty 1

## 2016-01-14 MED ORDER — LACTATED RINGERS IR SOLN
Status: DC | PRN
  Administered 2016-01-14: 1

## 2016-01-14 MED ORDER — HYDROMORPHONE HCL 1 MG/ML IJ SOLN
INTRAMUSCULAR | Status: AC
  Administered 2016-01-14: 0.5 mg
  Filled 2016-01-14: qty 1

## 2016-01-14 MED ORDER — ONDANSETRON HCL 4 MG/2ML IJ SOLN
4.0000 mg | INTRAMUSCULAR | Status: DC | PRN
  Administered 2016-01-14 – 2016-01-19 (×15): 4 mg via INTRAVENOUS
  Filled 2016-01-14 (×16): qty 2

## 2016-01-14 MED ORDER — GENTAMICIN SULFATE 40 MG/ML IJ SOLN
5.0000 mg/kg | Freq: Once | INTRAVENOUS | Status: AC
  Administered 2016-01-14: 450 mg via INTRAVENOUS
  Filled 2016-01-14: qty 11.25

## 2016-01-14 MED ORDER — SODIUM CHLORIDE 0.9 % IV SOLN
1000.0000 mg | Freq: Once | INTRAVENOUS | Status: AC
  Administered 2016-01-14: 1000 mg via INTRAVENOUS

## 2016-01-14 MED ORDER — EPHEDRINE SULFATE 50 MG/ML IJ SOLN
INTRAMUSCULAR | Status: AC
  Filled 2016-01-14: qty 1

## 2016-01-14 MED ORDER — ONDANSETRON HCL 4 MG/2ML IJ SOLN
4.0000 mg | Freq: Once | INTRAMUSCULAR | Status: AC | PRN
  Administered 2016-01-14: 4 mg via INTRAVENOUS

## 2016-01-14 MED ORDER — ALBUTEROL SULFATE HFA 108 (90 BASE) MCG/ACT IN AERS: 2.0000 | INHALATION_SPRAY | Freq: Four times a day (QID) | RESPIRATORY_TRACT | Status: DC | PRN

## 2016-01-14 MED ORDER — HYDROMORPHONE HCL 1 MG/ML IJ SOLN
0.5000 mg | INTRAMUSCULAR | Status: DC | PRN
  Administered 2016-01-14 – 2016-01-18 (×9): 1 mg via INTRAVENOUS
  Filled 2016-01-14 (×10): qty 1

## 2016-01-14 MED ORDER — ONDANSETRON HCL 4 MG/2ML IJ SOLN
INTRAMUSCULAR | Status: DC | PRN
  Administered 2016-01-14: 4 mg via INTRAVENOUS

## 2016-01-14 MED ORDER — PANTOPRAZOLE SODIUM 40 MG IV SOLR
40.0000 mg | Freq: Every day | INTRAVENOUS | Status: DC
  Administered 2016-01-14 – 2016-01-18 (×5): 40 mg via INTRAVENOUS
  Filled 2016-01-14 (×6): qty 40

## 2016-01-14 MED ORDER — LIDOCAINE HCL (CARDIAC) 20 MG/ML IV SOLN
INTRAVENOUS | Status: DC | PRN
  Administered 2016-01-14: 100 mg via INTRAVENOUS

## 2016-01-14 MED ORDER — HEPARIN SODIUM (PORCINE) 5000 UNIT/ML IJ SOLN
5000.0000 [IU] | INTRAMUSCULAR | Status: AC
  Administered 2016-01-14: 5000 [IU] via SUBCUTANEOUS
  Filled 2016-01-14: qty 1

## 2016-01-14 MED ORDER — SUFENTANIL CITRATE 50 MCG/ML IV SOLN
INTRAVENOUS | Status: DC | PRN
  Administered 2016-01-14: 15 ug via INTRAVENOUS
  Administered 2016-01-14 (×2): 10 ug via INTRAVENOUS
  Administered 2016-01-14: 5 ug via INTRAVENOUS
  Administered 2016-01-14: 10 ug via INTRAVENOUS

## 2016-01-14 MED ORDER — SUFENTANIL CITRATE 50 MCG/ML IV SOLN
INTRAVENOUS | Status: AC
  Filled 2016-01-14: qty 1

## 2016-01-14 MED ORDER — VANCOMYCIN HCL 1000 MG IV SOLR
1000.0000 mg | INTRAVENOUS | Status: DC
  Filled 2016-01-14: qty 1000

## 2016-01-14 MED ORDER — CHLORHEXIDINE GLUCONATE CLOTH 2 % EX PADS: 6.0000 | MEDICATED_PAD | Freq: Once | CUTANEOUS | Status: DC

## 2016-01-14 MED ORDER — MORPHINE SULFATE (PF) 2 MG/ML IV SOLN: 2.0000 mg | INTRAVENOUS | Status: DC | PRN

## 2016-01-14 MED ORDER — MIDAZOLAM HCL 2 MG/2ML IJ SOLN
INTRAMUSCULAR | Status: AC
  Filled 2016-01-14: qty 2

## 2016-01-14 MED ORDER — LIDOCAINE HCL (CARDIAC) 20 MG/ML IV SOLN
INTRAVENOUS | Status: AC
  Filled 2016-01-14: qty 5

## 2016-01-14 MED ORDER — SCOPOLAMINE 1 MG/3DAYS TD PT72
MEDICATED_PATCH | TRANSDERMAL | Status: AC
  Filled 2016-01-14: qty 1

## 2016-01-14 MED ORDER — ACETAMINOPHEN 500 MG PO TABS: 500.0000 mg | ORAL_TABLET | Freq: Four times a day (QID) | ORAL | Status: DC | PRN

## 2016-01-14 MED ORDER — SUGAMMADEX SODIUM 200 MG/2ML IV SOLN
INTRAVENOUS | Status: DC | PRN
  Administered 2016-01-14: 300 mg via INTRAVENOUS

## 2016-01-14 MED ORDER — VANCOMYCIN HCL IN DEXTROSE 1-5 GM/200ML-% IV SOLN
INTRAVENOUS | Status: AC
  Filled 2016-01-14: qty 200

## 2016-01-14 MED ORDER — LACTATED RINGERS IV SOLN
INTRAVENOUS | Status: DC | PRN
  Administered 2016-01-14 (×2): via INTRAVENOUS

## 2016-01-14 MED ORDER — DEXAMETHASONE SODIUM PHOSPHATE 10 MG/ML IJ SOLN
INTRAMUSCULAR | Status: DC | PRN
  Administered 2016-01-14: 10 mg via INTRAVENOUS

## 2016-01-14 MED ORDER — FENTANYL CITRATE (PF) 100 MCG/2ML IJ SOLN
INTRAMUSCULAR | Status: AC
  Filled 2016-01-14: qty 2

## 2016-01-14 MED ORDER — SUGAMMADEX SODIUM 500 MG/5ML IV SOLN
INTRAVENOUS | Status: AC
  Filled 2016-01-14: qty 5

## 2016-01-14 MED ORDER — MIDAZOLAM HCL 2 MG/2ML IJ SOLN
INTRAMUSCULAR | Status: DC | PRN
  Administered 2016-01-14: 2 mg via INTRAVENOUS

## 2016-01-14 MED ORDER — VANCOMYCIN HCL 500 MG IV SOLR
500.0000 mg | Freq: Once | INTRAVENOUS | Status: AC
  Administered 2016-01-14: 500 mg via INTRAVENOUS
  Filled 2016-01-14: qty 500

## 2016-01-14 MED ORDER — BUPIVACAINE-EPINEPHRINE 0.25% -1:200000 IJ SOLN
INTRAMUSCULAR | Status: AC
  Filled 2016-01-14: qty 1

## 2016-01-14 MED ORDER — ACETAMINOPHEN 160 MG/5ML PO SOLN: 650.0000 mg | ORAL | Status: DC | PRN

## 2016-01-14 MED ORDER — FENTANYL CITRATE (PF) 100 MCG/2ML IJ SOLN
25.0000 ug | INTRAMUSCULAR | Status: DC | PRN
  Administered 2016-01-14 (×3): 25 ug via INTRAVENOUS

## 2016-01-14 MED ORDER — ROCURONIUM BROMIDE 100 MG/10ML IV SOLN
INTRAVENOUS | Status: AC
  Filled 2016-01-14: qty 1

## 2016-01-14 MED ORDER — SUCCINYLCHOLINE CHLORIDE 20 MG/ML IJ SOLN
INTRAMUSCULAR | Status: DC | PRN
  Administered 2016-01-14: 100 mg via INTRAVENOUS

## 2016-01-14 SURGICAL SUPPLY — 62 items
APPLICATOR COTTON TIP 6IN STRL (MISCELLANEOUS) ×1 IMPLANT
APPLIER CLIP ROT 10 11.4 M/L (STAPLE)
APPLIER CLIP ROT 13.4 12 LRG (CLIP)
APR CLP LRG 13.4X12 ROT 20 MLT (CLIP)
APR CLP MED LRG 11.4X10 (STAPLE)
BAG SPEC RTRVL LRG 6X4 10 (ENDOMECHANICALS) ×1
BLADE SURG SZ11 CARB STEEL (BLADE) ×2 IMPLANT
CABLE HIGH FREQUENCY MONO STRZ (ELECTRODE) ×2 IMPLANT
CHLORAPREP W/TINT 26ML (MISCELLANEOUS) ×2 IMPLANT
CLIP APPLIE ROT 10 11.4 M/L (STAPLE) IMPLANT
CLIP APPLIE ROT 13.4 12 LRG (CLIP) IMPLANT
COVER SURGICAL LIGHT HANDLE (MISCELLANEOUS) ×1 IMPLANT
DEVICE PMI PUNCTURE CLOSURE (MISCELLANEOUS) ×2 IMPLANT
DEVICE SUT QUICK LOAD TK 5 (STAPLE) ×2 IMPLANT
DEVICE SUT TI-KNOT TK 5X26 (MISCELLANEOUS) ×1 IMPLANT
DEVICE SUTURE ENDOST 10MM (ENDOMECHANICALS) ×1 IMPLANT
DRAPE UTILITY XL STRL (DRAPES) ×4 IMPLANT
ELECT REM PT RETURN 9FT ADLT (ELECTROSURGICAL) ×2
ELECTRODE REM PT RTRN 9FT ADLT (ELECTROSURGICAL) ×1 IMPLANT
GAUZE SPONGE 4X4 12PLY STRL (GAUZE/BANDAGES/DRESSINGS) ×2 IMPLANT
GLOVE BIOGEL PI IND STRL 7.5 (GLOVE) ×1 IMPLANT
GLOVE BIOGEL PI INDICATOR 7.5 (GLOVE) ×2
GLOVE ECLIPSE 7.5 STRL STRAW (GLOVE) ×3 IMPLANT
GOWN STRL REUS W/TWL XL LVL3 (GOWN DISPOSABLE) ×8 IMPLANT
HOVERMATT SINGLE USE (MISCELLANEOUS) ×2 IMPLANT
KIT BASIN OR (CUSTOM PROCEDURE TRAY) ×2 IMPLANT
LIQUID BAND (GAUZE/BANDAGES/DRESSINGS) ×2 IMPLANT
MARKER SKIN DUAL TIP RULER LAB (MISCELLANEOUS) ×2 IMPLANT
NDL SPNL 22GX3.5 QUINCKE BK (NEEDLE) ×1 IMPLANT
NEEDLE SPNL 22GX3.5 QUINCKE BK (NEEDLE) ×2 IMPLANT
PACK UNIVERSAL I (CUSTOM PROCEDURE TRAY) ×2 IMPLANT
POUCH SPECIMEN RETRIEVAL 10MM (ENDOMECHANICALS) ×1 IMPLANT
RELOAD STAPLE 60 3.6 BLU REG (STAPLE) ×1 IMPLANT
RELOAD STAPLE 60 4.1 GRN THCK (STAPLE) ×1 IMPLANT
RELOAD STAPLER BLUE 60MM (STAPLE) ×3 IMPLANT
RELOAD STAPLER GOLD 60MM (STAPLE) ×2 IMPLANT
RELOAD STAPLER GREEN 60MM (STAPLE) ×1 IMPLANT
SCISSORS LAP 5X45 EPIX DISP (ENDOMECHANICALS) ×2 IMPLANT
SET IRRIG TUBING LAPAROSCOPIC (IRRIGATION / IRRIGATOR) ×2 IMPLANT
SHEARS HARMONIC ACE PLUS 45CM (MISCELLANEOUS) ×2 IMPLANT
SLEEVE ADV FIXATION 5X100MM (TROCAR) ×2 IMPLANT
SLEEVE GASTRECTOMY 36FR VISIGI (MISCELLANEOUS) ×2 IMPLANT
SOLUTION ANTI FOG 6CC (MISCELLANEOUS) ×2 IMPLANT
SPONGE LAP 18X18 X RAY DECT (DISPOSABLE) ×2 IMPLANT
STAPLER ECHELON LONG 60 440 (INSTRUMENTS) ×2 IMPLANT
STAPLER RELOAD BLUE 60MM (STAPLE) ×6
STAPLER RELOAD GOLD 60MM (STAPLE) ×4
STAPLER RELOAD GREEN 60MM (STAPLE) ×2
SUT DEVICE BRAIDED 0X39 (SUTURE) IMPLANT
SUT MNCRL AB 4-0 PS2 18 (SUTURE) ×2 IMPLANT
SUT VICRYL 0 TIES 12 18 (SUTURE) ×2 IMPLANT
SYR 10ML ECCENTRIC (SYRINGE) ×2 IMPLANT
SYR 20CC LL (SYRINGE) ×2 IMPLANT
TIP RIGID 35CM EVICEL (HEMOSTASIS) ×2 IMPLANT
TOWEL OR 17X26 10 PK STRL BLUE (TOWEL DISPOSABLE) ×2 IMPLANT
TOWEL OR NON WOVEN STRL DISP B (DISPOSABLE) ×2 IMPLANT
TROCAR ADV FIXATION 5X100MM (TROCAR) ×2 IMPLANT
TROCAR BLADELESS 15MM (ENDOMECHANICALS) ×2 IMPLANT
TROCAR BLADELESS OPT 5 100 (ENDOMECHANICALS) ×2 IMPLANT
TUBING CONNECTING 10 (TUBING) ×2 IMPLANT
TUBING ENDO SMARTCAP PENTAX (MISCELLANEOUS) ×2 IMPLANT
TUBING INSUF HEATED (TUBING) ×2 IMPLANT

## 2016-01-14 NOTE — Plan of Care (Signed)
Problem: Respiratory: Goal: Ability to maintain adequate ventilation will improve Outcome: Completed/Met Date Met:  01/14/16 Pt using IS reaching 1500 with goal of 2000.

## 2016-01-14 NOTE — Progress Notes (Signed)
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 Stated "my throat swells". Dr. Excell Seltzer aware via phone. Md also aware of recent 75 ml bloody emesis with several clots noted. See new orders received.

## 2016-01-14 NOTE — Progress Notes (Signed)
5th floor notified pt will be in room in 20 minutes.

## 2016-01-14 NOTE — Op Note (Signed)
Name:  [name] MRN: [id number] Date of Surgery: 01/14/2016  Preop Diagnosis:  Morbid Obesity  Postop Diagnosis:  Morbid Obesity (Weight - 295, BMI - 46.3) , S/P Gastric Sleeve  Procedure:  Upper endoscopy  (Intraoperative)  Surgeon:  Alphonsa Overall, M.D.  Anesthesia:  GET  Indications for procedure: [name] is a 40 y.o. female whose primary care physician is SMITH,KRISTI, MD and has completed a Gastric Sleeve today by Dr. Excell Seltzer.  I am doing an intraoperative upper endoscopy to evaluate the gastric pouch.  Operative Note: The patient is under general anesthesia.  Dr. Excell Seltzer is laparoscoping the patient while I do an upper endoscopy to evaluate the stomach pouch.  With the patient intubated, I passed the Pentax upper endoscope without difficulty down the esophagus.  The esophago-gastric junction was at 38 cm.    The mucosa of the stomach looked viable and the staple line was intact without bleeding.  I advanced to the pylorus, but did not go through it.  The was an angulation about midway down the pouch, but I was easily able to advance the scope beyond this.  While I insufflated the stomach pouch with air, Dr. Excell Seltzer  flooded the upper abdomen with saline to put the gastric pouch under saline.  There was no bubbling or evidence of a leak.  There was no evidence of narrowing of the pouch and the gastric sleeve looked tubular.  The scope was then withdrawn.  The esophagus was unremarkable and the patient tolerated the endoscopy without difficulty.  Alphonsa Overall, MD, [facility] Surgery Pager: [phone] Office phone:  [phone]

## 2016-01-14 NOTE — Op Note (Signed)
Preoperative Diagnosis: Morbid Obesity  Postoprative Diagnosis: Morbid Obesity  Procedure: Procedure(s): LAPAROSCOPIC GASTRIC SLEEVE RESECTION   Surgeon: Excell Seltzer T   Assistants: Alphonsa Overall  Anesthesia:  General endotracheal anesthesia  Indications: Patient is a 40 year old female who presents with a BMI of 48 unresponsive to medical management with comorbidities of chronic joint pain and DJD of the knees. After extensive preoperative workup and discussion detailed elsewhere we have elected to proceed with laparoscopic sleeve gastrectomy for treatment of her morbid obesity.    Procedure Detail:  Patient was brought to the operating room, placed in the supine position on the operating table, and general endotracheal anesthesia induced. She had received preoperative IV antibiotics and subcutaneous heparin. The abdomen was widely sterilely prepped and draped. Patient timeout was performed and correct procedure verified. Access was obtained with the 5 mm Optiview in the left upper quadrant without difficulty. No evidence of trocar injury. Under direct vision a 5 mm trocar was placed laterally in the right upper quadrant, a 15 mm trocar at the base of the falciform ligament in the right upper quadrant and a 5 mm trocar above to the left of the umbilicus for the camera port. Through a 5 mm subxiphoid site the [facility] retractor was placed in the left lobe of the liver elevated with excellent exposure of the stomach and hiatus. An additional 5 mm trocar was placed laterally in the left upper quadrant. Initially the greater curve was dissected beginning about mid stomach and the lesser sac easily entered. The dissection progressed proximally along the greater curve dividing short gastric vessels with the Harmonic scalpel. The fundus was completely mobilized away from the spleen. The left crus was completely dissected. There appeared to be a small hernia sac seen along the left crus. I felt that  there was definite dimpling in the upper stomach could be advanced through what appeared to be a small hiatal hernia. We did test this with a 10 mL balloon which did hang up at the hiatus but again examining the hiatus grossly there clearly appeared to be a small hernia. I elected to go ahead with repair. The gastrohepatic omentum was divided and the clear area and the right crus exposed. The peritoneum along the right crus was dissected and careful blunt dissection was carried into the retroesophageal space. The left crus was easily identified and the vagus nerve and esophagus carefully deflected anteriorly. This appeared to confirm a small to moderate sized hiatal hernia. A single suture repair was performed posteriorly between the crura with 0 Ethibond. The hiatus and fat pad and proximal stomach could been completely dissected. We then continued distally along the greater curve mobilizing further vessels distally until we had cleared the greater curve to 5 cm from the pylorus. There were no posterior attachments of the stomach was completely free along its lesser curve vasculature. The 55 French VisiG tube was advanced orally with his tip down to the pylorus. It was positioned along the lesser curve and suction applied. Beginning 5 cm from the pylorus and initial firing of the green load stapler was performed with the stomach appearing relatively thin. A second firing of the green load 60 mm stapler was then performed going up past the incision area leaving a little extra room here adjacent to the tube. For further firings of the 60 mm blue load stapler were then performed adjacent to the tube but not tight against and working proximally with a final firing angling just off of the esophageal fat  pad completing the sleeve. The sleeve appeared symmetric without twisting or narrowing. Insufflate with air there was no evidence of leak. Dr. Lucia Gaskins and performed upper endoscopy. There was no bleeding but there did  appear to be some slight narrowing or angulation at about the mid body of the stomach although examining the sleeve laparoscopically during endoscopy it appeared to be symmetrical without any obvious narrowing the could be reconciled with the endoscopic appearance. The scope did have her passed distally and there was no evidence of leak. The sleeve was  deflated and the scope removed.  I again externally could not identify any significant area of angulation or narrowing. A few oozing areas along the staple line were controlled with clips and the staple line was coated with Evaseal. The sleeve specimen was then withdrawn through the 15 mm trocar site after dilating it slightly and this fascial defect was closed with 0 Vicryl. The abdomen was inspected for hemostasis or trocar injury or other problems and everything looked fine. All CO2 was evacuated and trochars removed after removing the Nathanson retractor under direct vision. Skin incisions were closed with subcuticular Monocryl and Liquiban. The sponge needle and instrument counts were correct.    Findings: As above  Estimated Blood Loss:  Minimal         Drains: none  Blood Given: none          Specimens: Greater curvature of stomach        Complications:  * No complications entered in OR log *         Disposition: PACU - hemodynamically stable.         Condition: stable

## 2016-01-14 NOTE — Anesthesia Postprocedure Evaluation (Signed)
Anesthesia Post Note  Patient: [name]  Procedure(s) Performed: Procedure(s) (LRB): LAPAROSCOPIC GASTRIC SLEEVE RESECTION (N/A)  Patient location during evaluation: PACU Anesthesia Type: General Level of consciousness: awake and alert Pain management: pain level controlled Vital Signs Assessment: post-procedure vital signs reviewed and stable Respiratory status: spontaneous breathing, nonlabored ventilation, respiratory function stable and patient connected to nasal cannula oxygen Cardiovascular status: blood pressure returned to baseline and stable Postop Assessment: no signs of nausea or vomiting Anesthetic complications: no    Last Vitals:  Filed Vitals:   01/14/16 1305 01/14/16 1408  BP: 134/94 136/80  Pulse: 80 73  Temp: 36.7 C 36.7 C  Resp: 18 18    Last Pain:  Filed Vitals:   01/14/16 1521  PainSc: Asleep                 Brittni Hult JENNETTE

## 2016-01-14 NOTE — Anesthesia Procedure Notes (Signed)
Procedure Name: Intubation Date/Time: 01/14/2016 7:23 AM Performed by: Danley Danker L Patient Re-evaluated:Patient Re-evaluated prior to inductionOxygen Delivery Method: Circle system utilized Preoxygenation: Pre-oxygenation with 100% oxygen Intubation Type: IV induction Ventilation: Mask ventilation without difficulty and Oral airway inserted - appropriate to patient size Laryngoscope Size: Glidescope and 4 Grade View: Grade I Tube type: Parker flex tip Tube size: 7.5 mm Number of attempts: 1 Airway Equipment and Method: Stylet and Patient positioned with wedge pillow Placement Confirmation: ETT inserted through vocal cords under direct vision,  positive ETCO2 and breath sounds checked- equal and bilateral Secured at: 22 cm Tube secured with: Tape Dental Injury: Teeth and Oropharynx as per pre-operative assessment

## 2016-01-14 NOTE — Progress Notes (Signed)
Vancomycin ordered as powder, Dr Excell Seltzer not on call.Not able to verify. Garamycin IV also ordered

## 2016-01-14 NOTE — Progress Notes (Addendum)
Order received for Primary IVF's from Dr Excell Seltzer.

## 2016-01-14 NOTE — Transfer of Care (Signed)
Immediate Anesthesia Transfer of Care Note  Patient: [name]  Procedure(s) Performed: Procedure(s): LAPAROSCOPIC GASTRIC SLEEVE RESECTION (N/A)  Patient Location: PACU  Anesthesia Type:General  Level of Consciousness: awake  Airway & Oxygen Therapy: Patient Spontanous Breathing and Patient connected to face mask oxygen  Post-op Assessment: Report given to RN and Post -op Vital signs reviewed and stable  Post vital signs: Reviewed and stable  Last Vitals:  Filed Vitals:   01/14/16 0515  BP: 129/82  Pulse: 107  Temp: 36.7 C  Resp: 18    Complications: No apparent anesthesia complications

## 2016-01-14 NOTE — H&P (Signed)
  History of Present Illness Katherine Kitchen T. Ovid Witman MD; 01/09/2016 12:58 PM) Patient words: pre op.  The patient is a 40 year old female who presents with obesity. She returns for her preoperative visit prior to planned laparoscopic sleeve gastrectomy. The patient gives a history of progressive obesity since early adulthood despite multiple attempts at medical management. Obesity has been affecting the patient in a number of ways but specifically chronic joint pain and reduced mobility. 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 Alvan Lutz. She requires a walker now to ambulate and is felt that she could have significant improvement in her knee condition and mobility with weight loss.   She has successfully completed her preoperative workup. No concerns and psychologic are nutrition evaluation. Upper GI series was negative without hiatal hernia or reflux. Preoperative lab work was significant for anemia with hemoglobin of 8.8 which is a chronic problem for her due to chronic blood loss from her ulcerative colitis. She does require transfusions about twice per year. She generally has been feeling okay without any intercurrent illness or infections or GI complaints other than her chronic diarrhea.   Allergies [name], CMA; 01/09/2016 12:31 PM) Doxycycline Hyclate *Tetracyclines** Penicillin G Pot in Dextrose *PENICILLINS* OxyCODONE HCl *ANALGESICS - OPIOID* Ciprofloxacin HCl *Fluoroquinolones**  Medication History [name], CMA; 01/09/2016 12:31 PM) Phentermine HCl (15MG Capsule, Oral) Active. DULoxetine HCl (30MG Capsule DR Part, Oral) Active. Hydrocodone-Acetaminophen (5-325MG Tablet, Oral) Active. HydrOXYzine HCl (25MG Tablet, Oral) Active. Methocarbamol (500MG Tablet, Oral) Active. Mupirocin (2% Ointment, External) Active. Atrovent (0.03% Solution, Nasal) Active. Dicyclomine HCl (20MG Capsule, Oral) Active. Albuterol Sulfate HFA (108 (90 Base)MCG/ACT  Aerosol Soln, Inhalation) Active. Medications Reconciled  Vitals [name] CMA; 01/09/2016 12:31 PM) 01/09/2016 12:31 PM Weight: 295.8 lb Height: 67in Body Surface Area: 2.39 m Body Mass Index: 46.33 kg/m  Temp.: 54F  Pulse: 111 (Regular)  Resp.: 16 (Unlabored)  BP: 124/78 (Sitting, Left Arm, Standard)       Physical Exam Katherine Kitchen T. Katherine Sando MD; 01/09/2016 12:59 PM) The physical exam findings are as follows: Note:General: Alert, morbidly obese Afro-American female in no distress Skin: Warm and dry without rash or infection. HEENT: No palpable masses or thyromegaly. Sclera nonicteric. Lymph nodes: No cervical, supraclavicular, or inguinal nodes palpable. Lungs: Breath sounds clear and equal. No wheezing or increased work of breathing. Cardiovascular: Regular rate and rhythm without murmer. Abdomen: Nondistended. Soft and nontender. No masses palpable. No organomegaly. No palpable hernias. Extremities: No edema or joint swelling or deformity. No chronic venous stasis changes. Neurologic: Alert and fully oriented. Gait difficult due to chronic knee pain, walks with a cane Psychiatric: Normal mood and affect. Thought content appropriate with normal judgement and insight    Assessment & Plan Katherine Kitchen T. Gayle Collard MD; 01/09/2016 1:00 PM) OBESITY, MORBID, BMI 40.0-49.9 (E66.01) Impression: Patient with progressive morbid obesity unresponsive to multiple efforts at medical management who presents with a BMI of 48.5 and comorbidities of chronic joint pain and DJD of knees. I believe there would be very significant medical benefit from surgical weight loss. Ready to proceed with planned sleeve gastrectomy. We again reviewed the procedure and recovery and risks. She has been through our consent form. We reviewed her preoperative studies. She is given prescriptions for pain and nausea medication and Protonix. She is on her preoperative diet. Ready to proceed with planned  laparoscopic sleeve gastrectomy.

## 2016-01-15 ENCOUNTER — Inpatient Hospital Stay (HOSPITAL_COMMUNITY): Payer: 59

## 2016-01-15 LAB — CBC WITH DIFFERENTIAL/PLATELET
BASOS ABS: 0 10*3/uL (ref 0.0–0.1)
Basophils Relative: 0 %
Eosinophils Absolute: 0 10*3/uL (ref 0.0–0.7)
Eosinophils Relative: 0 %
HEMATOCRIT: 27.1 % — AB (ref 36.0–46.0)
HEMOGLOBIN: 8.4 g/dL — AB (ref 12.0–15.0)
LYMPHS ABS: 2.6 10*3/uL (ref 0.7–4.0)
LYMPHS PCT: 16 %
MCH: 23.6 pg — AB (ref 26.0–34.0)
MCHC: 31 g/dL (ref 30.0–36.0)
MCV: 76.1 fL — AB (ref 78.0–100.0)
Monocytes Absolute: 1.1 10*3/uL — ABNORMAL HIGH (ref 0.1–1.0)
Monocytes Relative: 7 %
NEUTROS ABS: 12.8 10*3/uL — AB (ref 1.7–7.7)
Neutrophils Relative %: 78 %
Platelets: 438 10*3/uL — ABNORMAL HIGH (ref 150–400)
RBC: 3.56 MIL/uL — AB (ref 3.87–5.11)
RDW: 16.3 % — ABNORMAL HIGH (ref 11.5–15.5)
WBC: 16.6 10*3/uL — AB (ref 4.0–10.5)

## 2016-01-15 LAB — HEMOGLOBIN AND HEMATOCRIT, BLOOD
HCT: 30.2 % — ABNORMAL LOW (ref 36.0–46.0)
Hemoglobin: 9.1 g/dL — ABNORMAL LOW (ref 12.0–15.0)

## 2016-01-15 MED ORDER — LIP MEDEX EX OINT
TOPICAL_OINTMENT | CUTANEOUS | Status: AC
  Administered 2016-01-15: 08:00:00
  Filled 2016-01-15: qty 7

## 2016-01-15 NOTE — Progress Notes (Signed)
Patient alert and oriented, Post op day 1.  Provided support and encouragement.  Encouraged pulmonary toilet, ambulation and small sips of liquids when swallow study returned satisfactorily.  Patient complains of nausea, discussed medication schedule All questions answered.  Will continue to monitor.

## 2016-01-15 NOTE — Progress Notes (Signed)
Nutrition Brief Note  RD consulted to provide diet education for post-op diet. Pt is s/p LAPAROSCOPIC GASTRIC SLEEVE RESECTION. Patient was sleeping when RD visited room. Pt requests RD provide education at a later time. Per chart review, pt has had nausea and has received nausea & pain medications.  RD to attempt education at a later time.  Clayton Bibles, MS, RD, LDN Pager: [phone] After Hours Pager: [phone]

## 2016-01-15 NOTE — Progress Notes (Signed)
Patient ID: [name], female   DOB: 1976-06-04, 40 y.o.   MRN: [id number] 1 Day Post-Op  Subjective: Has nausea and some retching after IV pain meds.  Pain not too severe, well controlled with meds.  Ambulatory  Objective: Vital signs in last 24 hours: Temp:  [97.8 F (36.6 C)-99.1 F (37.3 C)] 98 F (36.7 C) (04/19 0559) Pulse Rate:  [70-97] 72 (04/19 0559) Resp:  [12-19] 16 (04/19 0559) BP: (116-165)/(65-105) 133/73 mmHg (04/19 0559) SpO2:  [95 %-100 %] 98 % (04/19 0559) Last BM Date: 01/13/16  Intake/Output from previous day: 04/18 0701 - 04/19 0700 In: 2919.3 [I.V.:2458.3; IV Piggyback:461] Out: 2700 [Urine:2550; Emesis/NG output:125; Blood:25] Intake/Output this shift:    General appearance: alert, cooperative and no distress GI: normal findings: soft, non-tender Incision/Wound: Clean and dry  Lab Results:   Recent Labs  01/14/16 1905 01/15/16 0425  WBC  --  16.6*  HGB 9.1* 8.4*  HCT 29.6* 27.1*  PLT  --  438*   BMET No results for input(s): NA, K, CL, CO2, GLUCOSE, BUN, CREATININE, CALCIUM in the last 72 hours.   Studies/Results: No results found.  Anti-infectives: Anti-infectives    Start     Dose/Rate Route Frequency Ordered Stop   01/14/16 0800  vancomycin (VANCOCIN) 500 mg in sodium chloride 0.9 % 100 mL IVPB     500 mg 100 mL/hr over 60 Minutes Intravenous  Once 01/14/16 0752 01/14/16 0850   01/14/16 0700  gentamicin (GARAMYCIN) 450 mg in dextrose 5 % 100 mL IVPB     5 mg/kg  90.9 kg (Adjusted) 222.5 mL/hr over 30 Minutes Intravenous  Once 01/14/16 0645 01/14/16 0822   01/14/16 0700  vancomycin (VANCOCIN) 1,000 mg in sodium chloride 0.9 % 250 mL IVPB     1,000 mg 250 mL/hr over 60 Minutes Intravenous  Once 01/14/16 0708 01/14/16 0800   01/14/16 0600  gentamicin (GARAMYCIN) 450 mg in dextrose 5 % 50 mL IVPB  Status:  Discontinued     5 mg/kg  90.9 kg (Adjusted) 122.5 mL/hr over 30 Minutes Intravenous  Once 01/13/16 1344 01/14/16 0646   01/14/16 0530  vancomycin (VANCOCIN) powder 1,000 mg  Status:  Discontinued     1,000 mg Other To Surgery 01/14/16 0521 01/14/16 0754      Assessment/Plan: s/p Procedure(s): LAPAROSCOPIC GASTRIC SLEEVE RESECTION PONV-has been a problem for her in the past.  Abdomen seems benign Will get gastrograffin swallow this AM before starting liquid diet Multiple allergies.  On dilaudid for pain-stated she can take hydrocodone so this is an option Chronic anemia due to UC.  Hbg stable from pre op-observe    LOS: 1 day    Adir Schicker T 01/15/2016

## 2016-01-16 LAB — CBC WITH DIFFERENTIAL/PLATELET
Basophils Absolute: 0 10*3/uL (ref 0.0–0.1)
Basophils Relative: 0 %
EOS ABS: 0.2 10*3/uL (ref 0.0–0.7)
Eosinophils Relative: 2 %
HCT: 27.3 % — ABNORMAL LOW (ref 36.0–46.0)
HEMOGLOBIN: 8.3 g/dL — AB (ref 12.0–15.0)
LYMPHS ABS: 3.7 10*3/uL (ref 0.7–4.0)
Lymphocytes Relative: 37 %
MCH: 22.9 pg — AB (ref 26.0–34.0)
MCHC: 30.4 g/dL (ref 30.0–36.0)
MCV: 75.4 fL — ABNORMAL LOW (ref 78.0–100.0)
MONO ABS: 0.6 10*3/uL (ref 0.1–1.0)
MONOS PCT: 5 %
NEUTROS PCT: 56 %
Neutro Abs: 5.7 10*3/uL (ref 1.7–7.7)
Platelets: 373 10*3/uL (ref 150–400)
RBC: 3.62 MIL/uL — ABNORMAL LOW (ref 3.87–5.11)
RDW: 16.2 % — AB (ref 11.5–15.5)
WBC: 10.2 10*3/uL (ref 4.0–10.5)

## 2016-01-16 NOTE — Progress Notes (Signed)
[facility]:  [phone] General Surgery Progress Note   LOS: 2 days  POD -  2 Days Post-Op  Assessment/Plan: 1.  LAPAROSCOPIC GASTRIC SLEEVE RESECTION - 01/14/2016 - [facility]  Morbid Obesity (Weight - 295, BMI - 46.3)  She has had persistent nausea and is not keeping down much water.  Will plan to keep on IVF and keep here another day.  2.  Chronic joint pain and DJD of knees 3.  DVT prophylaxis - Lovenox   Active Problems:   Morbid obesity with BMI of 45.0-49.9, adult (HCC)   Subjective:  Nauseated with water.  Otherwise is doing okay.  Objective:   Filed Vitals:   01/15/16 2051 01/16/16 0159  BP: 125/64 130/81  Pulse: 61 77  Temp: 98.3 F (36.8 C) 98.4 F (36.9 C)  Resp: 18 18     Intake/Output from previous day:  04/19 0701 - 04/20 0700 In: 3618.3 [P.O.:750; I.V.:2868.3] Out: 1900 [Urine:1900]  Intake/Output this shift:  Total I/O In: 46.7 [I.V.:46.7] Out: -    Physical Exam:   General: Obese AA F who is alert and oriented.    HEENT: Normal. Pupils equal. .   Lungs: Clear.  Good IS.   Abdomen: Soft.  Has BS.   Wound: Clean   Lab Results:    Recent Labs  01/15/16 0425 01/15/16 1540 01/16/16 0436  WBC 16.6*  --  10.2  HGB 8.4* 9.1* 8.3*  HCT 27.1* 30.2* 27.3*  PLT 438*  --  373    BMET  No results for input(s): NA, K, CL, CO2, GLUCOSE, BUN, CREATININE, CALCIUM in the last 72 hours.  PT/INR  No results for input(s): LABPROT, INR in the last 72 hours.  ABG  No results for input(s): PHART, HCO3 in the last 72 hours.  Invalid input(s): PCO2, PO2   Studies/Results:  Dg Ugi W/water Sol Cm  01/15/2016  CLINICAL DATA:  Status post gastric sleeve surgery. EXAM: WATER SOLUBLE UPPER GI SERIES TECHNIQUE: Single-column upper GI series was performed using water soluble contrast. CONTRAST:  50 cc of Omni 300 COMPARISON:  11/07/2015 FLUOROSCOPY TIME:  Radiation Exposure Index (as provided by the fluoroscopic device): 162 If the device  does not provide the exposure index: Fluoroscopy Time (in minutes and seconds):  30 seconds Number of Acquired Images: FINDINGS: On the scout radiograph there are surgical clips noted within the left upper quadrant of the abdomen. No dilated loops of bowel identified. Following the oral ingestion of the contrast material there is prompt opacification of the gastric remnant. No extravasation of contrast material identified. Antegrade progression of the contrast material through the gastric lumen into the proximal small bowel noted. IMPRESSION: 1. No extravasation of contrast material identified to suggest postoperative leak. Electronically Signed   By: Kerby Moors M.D.   On: 01/15/2016 10:08     Anti-infectives:   Anti-infectives    Start     Dose/Rate Route Frequency Ordered Stop   01/14/16 0800  vancomycin (VANCOCIN) 500 mg in sodium chloride 0.9 % 100 mL IVPB     500 mg 100 mL/hr over 60 Minutes Intravenous  Once 01/14/16 0752 01/14/16 0850   01/14/16 0700  gentamicin (GARAMYCIN) 450 mg in dextrose 5 % 100 mL IVPB     5 mg/kg  90.9 kg (Adjusted) 222.5 mL/hr over 30 Minutes Intravenous  Once 01/14/16 0645 01/14/16 0822   01/14/16 0700  vancomycin (VANCOCIN) 1,000 mg in sodium chloride 0.9 % 250 mL IVPB  1,000 mg 250 mL/hr over 60 Minutes Intravenous  Once 01/14/16 0708 01/14/16 0800   01/14/16 0600  gentamicin (GARAMYCIN) 450 mg in dextrose 5 % 50 mL IVPB  Status:  Discontinued     5 mg/kg  90.9 kg (Adjusted) 122.5 mL/hr over 30 Minutes Intravenous  Once 01/13/16 1344 01/14/16 0646   01/14/16 0530  vancomycin (VANCOCIN) powder 1,000 mg  Status:  Discontinued     1,000 mg Other To Surgery 01/14/16 0521 01/14/16 0754      Alphonsa Overall, MD, FACS Pager: [facility] Office: [phone] 01/16/2016

## 2016-01-16 NOTE — Progress Notes (Signed)
Patient alert and oriented, Post op day 2.  Provided support and encouragement.  Encouraged pulmonary toilet, ambulation and small sips of liquids.  All questions answered.  Will continue to monitor. 

## 2016-01-17 MED FILL — HYDROMORPHONE 1 MG/ML SOLN: 1 | 12 days supply | Qty: 150 | Fill #0

## 2016-01-17 NOTE — Progress Notes (Addendum)
Nutrition Education Note  Received consult for diet education per DROP protocol.   Discussed 2 week post op diet with pt. Emphasized that liquids must be non carbonated, non caffeinated, and sugar free. Fluid goals discussed. Reviewed progression of diet to include soft proteins at 7-10 days post-op. Pt to follow up with outpatient bariatric RD for further diet progression after 2 weeks. Multivitamins and minerals also reviewed. Teach back method used, pt expressed understanding, expect good compliance.   Pt reports that she has not been tolerating cold liquids as they cause N/V. She has been tolerating warm liquids and Unjury chicken soup-flavor without issue. Pt reports that confusion about calcium supplement s/p d/c was clarified by nurse educator earlier today. Pt reports she attended pre-op group class and met one-on-one with outpatient RD concerning post-op diet prior to surgery.    Diet: First 2 Weeks  You will see the dietitian about two (2) weeks after your surgery. The dietitian will increase the types of foods you can eat if you are handling liquids well:  If you have severe vomiting or nausea and cannot handle clear liquids lasting longer than 1 day, call your surgeon  Protein Shake  Drink at least 2 ounces of shake 5-6 times per day  Each serving of protein shakes (usually 8 - 12 ounces) should have a minimum of:  15 grams of protein  And no more than 5 grams of carbohydrate  Goal for protein each day:  Men = 80 grams per day  Women = 60 grams per day  Protein powder may be added to fluids such as non-fat milk or Lactaid milk or Soy milk (limit to 35 grams added protein powder per serving)   Hydration  Slowly increase the amount of water and other clear liquids as tolerated (See Acceptable Fluids)  Slowly increase the amount of protein shake as tolerated  Sip fluids slowly and throughout the day  May use sugar substitutes in small amounts (no more than 6 - 8 packets per day;  i.e. Splenda)   Fluid Goal  The first goal is to drink at least 8 ounces of protein shake/drink per day (or as directed by the nutritionist); some examples of protein shakes are [employer], [employer], EAS Edge HP, and Unjury. See handout from pre-op Bariatric Education Class:  Slowly increase the amount of protein shake you drink as tolerated  You may find it easier to slowly sip shakes throughout the day  It is important to get your proteins in first  Your fluid goal is to drink 64 - 100 ounces of fluid daily  It may take a few weeks to build up to this  32 oz (or more) should be clear liquids  And  32 oz (or more) should be full liquids (see below for examples)  Liquids should not contain sugar, caffeine, or carbonation   Clear Liquids:  Water or Sugar-free flavored water (i.e. Fruit H2O, Propel)  Decaffeinated coffee or tea (sugar-free)  Crystal Lite, Wyler's Lite, Minute Maid Lite  Sugar-free Jell-O  Bouillon or broth  Sugar-free Popsicle: *Less than 20 calories each; Limit 1 per day   Full Liquids:  Protein Shakes/Drinks + 2 choices per day of other full liquids  Full liquids must be:  No More Than 12 grams of Carbs per serving  No More Than 3 grams of Fat per serving  Strained low-fat cream soup  Non-Fat milk  Fat-free Lactaid Milk  Sugar-free yogurt (Dannon Lite & Fit, Greek yogurt)  Jarome Matin, RD, LDN Inpatient Clinical Dietitian Pager # [phone] After hours/weekend pager # [phone]

## 2016-01-17 NOTE — Progress Notes (Signed)
[facility] Office:  [phone] General Surgery Progress Note   LOS: 3 days  POD -  3 Days Post-Op  Assessment/Plan: 1.  LAPAROSCOPIC GASTRIC SLEEVE RESECTION - 01/14/2016 - [facility]  Morbid Obesity (Weight - 295, BMI - 46.3)  Nausea better, but po intake is limited.  She is probably on the border of going home vs staying.  After talking for a while - I think she is probably better her one more day.  2.  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  DVT prophylaxis - Lovenox   Active Problems:   Morbid obesity with BMI of 45.0-49.9, adult (HCC)   Subjective:  Nausea is better, but still there.  She has more trouble with water than the protein drinks.  Otherwise is doing okay.  Objective:   Filed Vitals:   01/17/16 0122 01/17/16 0511  BP: 141/64 123/66  Pulse: 80 75  Temp: 98.6 F (37 C) 98.4 F (36.9 C)  Resp: 16 18     Intake/Output from previous day:  04/20 0701 - 04/21 0700 In: 2331.7 [I.V.:2331.7] Out: 2950 [Urine:2950]  Intake/Output this shift:      Physical Exam:   General: Obese AA F who is alert and oriented.  In no distress, but on cell phone.   HEENT: Normal. Pupils equal. .   Lungs: Clear.     Abdomen: Soft.  Has BS.   Wound: Clean   Lab Results:     Recent Labs  01/15/16 0425 01/15/16 1540 01/16/16 0436  WBC 16.6*  --  10.2  HGB 8.4* 9.1* 8.3*  HCT 27.1* 30.2* 27.3*  PLT 438*  --  373    BMET  No results for input(s): NA, K, CL, CO2, GLUCOSE, BUN, CREATININE, CALCIUM in the last 72 hours.  PT/INR  No results for input(s): LABPROT, INR in the last 72 hours.  ABG  No results for input(s): PHART, HCO3 in the last 72 hours.  Invalid input(s): PCO2, PO2   Studies/Results:  Dg Ugi W/water Sol Cm  01/15/2016  CLINICAL DATA:  Status post gastric sleeve surgery. EXAM: WATER SOLUBLE UPPER GI SERIES TECHNIQUE: Single-column upper GI series was performed using water soluble contrast. CONTRAST:  50 cc of Omni 300  COMPARISON:  11/07/2015 FLUOROSCOPY TIME:  Radiation Exposure Index (as provided by the fluoroscopic device): 162 If the device does not provide the exposure index: Fluoroscopy Time (in minutes and seconds):  30 seconds Number of Acquired Images: FINDINGS: On the scout radiograph there are surgical clips noted within the left upper quadrant of the abdomen. No dilated loops of bowel identified. Following the oral ingestion of the contrast material there is prompt opacification of the gastric remnant. No extravasation of contrast material identified. Antegrade progression of the contrast material through the gastric lumen into the proximal small bowel noted. IMPRESSION: 1. No extravasation of contrast material identified to suggest postoperative leak. Electronically Signed   By: Kerby Moors M.D.   On: 01/15/2016 10:08     Anti-infectives:   Anti-infectives    Start     Dose/Rate Route Frequency Ordered Stop   01/14/16 0800  vancomycin (VANCOCIN) 500 mg in sodium chloride 0.9 % 100 mL IVPB     500 mg 100 mL/hr over 60 Minutes Intravenous  Once 01/14/16 0752 01/14/16 0850   01/14/16 0700  gentamicin (GARAMYCIN) 450 mg in dextrose 5 % 100 mL IVPB     5 mg/kg  90.9 kg (Adjusted) 222.5 mL/hr over 30  Minutes Intravenous  Once 01/14/16 0645 01/14/16 0822   01/14/16 0700  vancomycin (VANCOCIN) 1,000 mg in sodium chloride 0.9 % 250 mL IVPB     1,000 mg 250 mL/hr over 60 Minutes Intravenous  Once 01/14/16 0708 01/14/16 0800   01/14/16 0600  gentamicin (GARAMYCIN) 450 mg in dextrose 5 % 50 mL IVPB  Status:  Discontinued     5 mg/kg  90.9 kg (Adjusted) 122.5 mL/hr over 30 Minutes Intravenous  Once 01/13/16 1344 01/14/16 0646   01/14/16 0530  vancomycin (VANCOCIN) powder 1,000 mg  Status:  Discontinued     1,000 mg Other To Surgery 01/14/16 0521 01/14/16 0754      Alphonsa Overall, MD, FACS Pager: [facility] Office: [phone] 01/17/2016

## 2016-01-17 NOTE — Discharge Instructions (Addendum)
GASTRIC BYPASS/SLEEVE  Home Care Instructions   These instructions are to help you care for yourself when you go home.  Call: If you have any problems.  Call [phone] and ask for the surgeon on call  If you need immediate assistance come to the ER at [facility]. Tell the ER staff you are a new post-op gastric bypass or gastric sleeve patient  Signs and symptoms to report:  Severe  vomiting or nausea o If you cannot handle clear liquids for longer than 1 day, call your surgeon  Abdominal pain which does not get better after taking your pain medication  Fever greater than 100.4  F and chills  Heart rate over 100 beats a minute  Trouble breathing  Chest pain  Redness,  swelling, drainage, or foul odor at incision (surgical) sites  If your incisions open or pull apart  Swelling or pain in calf (lower leg)  Diarrhea (Loose bowel movements that happen often), frequent watery, uncontrolled bowel movements  Constipation, (no bowel movements for 3 days) if this happens: o Take Milk of Magnesia, 2 tablespoons by mouth, 3 times a day for 2 days if needed o Stop taking Milk of Magnesia once you have had a bowel movement o Call your doctor if constipation continues Or o Take Miralax  (instead of Milk of Magnesia) following the label instructions o Stop taking Miralax once you have had a bowel movement o Call your doctor if constipation continues  Anything you think is abnormal for you   Normal side effects after surgery:  Unable to sleep at night or unable to concentrate  Irritability  Being tearful (crying) or depressed  These are common complaints, possibly related to your anesthesia, stress of surgery, and change in lifestyle, that usually go away a few weeks after surgery. If these feelings continue, call your medical doctor.  Wound Care: You may have surgical glue, steri-strips, or staples over your incisions after surgery  Surgical glue: Looks like clear  film over your incisions and will wear off a little at a time  Steri-strips: Adhesive strips of tape over your incisions. You may notice a yellowish color on skin under the steri-strips. This is used to make the steri-strips stick better. Do not pull the steri-strips off - let them fall off  Staples: Staples may be removed before you leave the hospital o If you go home with staples, call [facility] for an appointment with your surgeons nurse to have staples removed 10 days after surgery, [phone]  Showering: You may shower two (2) days after your surgery unless your surgeon tells you differently o Wash gently around incisions with warm soapy water, rinse well, and gently pat dry o If you have a drain (tube from your incision), you may need someone to hold this while you shower o No tub baths until staples are removed and incisions are healed   Medications:  Medications should be liquid or crushed if larger than the size of a dime  Extended release pills (medication that releases a little bit at a time through the  day) should not be crushed  Depending on the size and number of medications you take, you may need to space (take a few throughout the day)/change the time you take your medications so that you do not over-fill your pouch (smaller stomach)  Make sure you follow-up with you primary care physician to make medication changes needed during rapid weight loss and life -style changes  If you have diabetes, follow up with your doctor that orders your diabetes medication(s) within one week after surgery and check your blood sugar regularly   Do not drive while taking narcotics (pain medications)   Do not take acetaminophen (Tylenol) and Roxicet or Lortab Elixir at the same time since these pain medications contain acetaminophen   Diet:  First 2 Weeks You will see the nutritionist about two (2) weeks after your surgery. The nutritionist will increase the types of  foods you can eat if you are handling liquids well:  If you have severe vomiting or nausea and cannot handle clear liquids lasting longer than 1 day call your surgeon Protein Shake  Drink at least 2 ounces of shake 5-6 times per day  Each serving of protein shakes (usually 8-12 ounces) should have a minimum of: o 15 grams of protein o And no more than 5 grams of carbohydrate  Goal for protein each day: o Men = 80 grams per day o Women = 60 grams per day     Protein powder may be added to fluids such as non-fat milk or Lactaid milk or Soy milk (limit to 35 grams added protein powder per serving)  Hydration  Slowly increase the amount of water and other clear liquids as tolerated (See Acceptable Fluids)  Slowly increase the amount of protein shake as tolerated  Sip fluids slowly and throughout the day  May use sugar substitutes in small amounts (no more than 6-8 packets per day; i.e. Splenda)  Fluid Goal  The first goal is to drink at least 8 ounces of protein shake/drink per day (or as directed by the nutritionist); some examples of protein shakes are [employer], [employer], EAS Edge HP, and Unjury. - See handout from pre-op Bariatric Education Class: o Slowly increase the amount of protein shake you drink as tolerated o You may find it easier to slowly sip shakes throughout the day o It is important to get your proteins in first  Your fluid goal is to drink 64-100 ounces of fluid daily o It may take a few weeks to build up to this   32 oz. (or more) should be clear liquids And  32 oz. (or more) should be full liquids (see below for examples)  Liquids should not contain sugar, caffeine, or carbonation  Clear Liquids:  Water of Sugar-free flavored water (i.e. Fruit HO, Propel)  Decaffeinated coffee or tea (sugar-free)  Crystal lite, Wylers Lite, Minute Maid Lite  Sugar-free Jell-O  Bouillon or broth  Sugar-free Popsicle:    - Less than 20 calories  each; Limit 1 per day  Full Liquids:                   Protein Shakes/Drinks + 2 choices per day of other full liquids  Full liquids must be: o No More Than 12 grams of Carbs per serving o No More Than 3 grams of Fat per serving  Strained low-fat cream soup  Non-Fat milk  Fat-free Lactaid Milk  Sugar-free yogurt (Dannon Lite & Fit, Greek yogurt)    Vitamins and Minerals  Start 1 day after surgery unless otherwise directed by your surgeon  2 Chewable Multivitamin / Multimineral Supplement with iron (i.e. Centrum for Adults)  Vitamin B-12, 350-500 micrograms sub-lingual (place tablet under the tongue) each day  Chewable Calcium Citrate with Vitamin D-3 (Example: 3 Chewable Calcium  Plus 600 with Vitamin D-3) o Take 500 mg three (3) times a day for  a total of 1500 mg each day o Do not take all 3 doses of calcium at one time as it may cause constipation, and you can only absorb 500 mg at a time o Do not mix multivitamins containing iron with calcium supplements;  take 2 hours apart o Do not substitute Tums (calcium carbonate) for your calcium  Menstruating women and those at risk for anemia ( a blood disease that causes weakness) may need extra iron o Talk to your doctor to see if you need more iron  If you need extra iron: Total daily Iron recommendation (including Vitamins) is 50 to 100 mg Iron/day  Do not stop taking or change any vitamins or minerals until you talk to your nutritionist or surgeon  Your nutritionist and/or surgeon must approve all vitamin and mineral supplements   Activity and Exercise: It is important to continue walking at home. Limit your physical activity as instructed by your doctor. During this time, use these guidelines:  Do not lift anything greater than ten  (10) pounds for at least two (2) weeks  Do not go back to work or drive until Engineer, production says you can  You may have sex when you feel comfortable o It is VERY important for female  patients to use a reliable birth control method; fertility often increase after surgery o Do not get pregnant for at least 18 months  Start exercising as soon as your doctor tells you that you can o Make sure your doctor approves any physical activity  Start with a simple walking program  Walk 5-15 minutes each day, 7 days per week  Slowly increase until you are walking 30-45 minutes per day  Consider joining our [place] program. [phone] or email [email or web address]   Special Instructions Things to remember:  Free counseling is available for you and your family through collaboration between [facility] and [facility]. Please call [phone] and leave a message  Use your CPAP when sleeping if this applies to you  Consider buying a medical alert bracelet that says you had lap-band surgery     You will likely have your first fill (fluid added to your band) 6 - 8 weeks after surgery  [facility] has a free Bariatric Surgery Support Group that meets monthly, the 3rd Thursday, [place]. You can see classes online at [email or web address]  It is very important to keep all follow up appointments with your surgeon, nutritionist, primary care physician, and behavioral health practitioner o After the first year, please follow up with your bariatric surgeon and nutritionist at least once a year in order to maintain best weight loss results                    [facility]:  [place]: [phone]               Bariatric Nurse Coordinator: [phone]  Gastric Bypass/Sleeve Home Care Instructions  Rev. 10/2012                                                         Reviewed and Vilinda Boehringer  by [facility] Patient Education Committee, Jan, 2014

## 2016-01-17 NOTE — Progress Notes (Signed)
Patient alert and oriented, pain is controlled. Patient is tolerating fluids,  advanced to protein shake yesterday, patient is having difficulty tolerating.  RN changed from premier to [employer] and patient seems to be having more success with the warm liquid.  Reviewed Gastric sleeve discharge instructions with patient and patient is able to articulate understanding.  Provided information on BELT program, Support Group and [facility] outpatient pharmacy. All questions answered, will continue to monitor.

## 2016-01-18 NOTE — Progress Notes (Signed)
[facility] Office:  [phone] General Surgery Progress Note   LOS: 4 days  POD -  4 Days Post-Op  Assessment/Plan: 1.  LAPAROSCOPIC GASTRIC SLEEVE RESECTION - 01/14/2016 - [facility]  Morbid Obesity (Weight - 295, BMI - 46.3)  Nausea better, but po intake is limited. Only able to take in ice chips and some of protein shakes per Pt 2.  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  DVT prophylaxis - Lovenox   Active Problems:   Morbid obesity with BMI of 45.0-49.9, adult (HCC)   Subjective:  Nausea is better, but still unable to tolerate much PO. Otherwise is doing okay.  Objective:   Filed Vitals:   01/18/16 0059 01/18/16 0524  BP: 96/47 107/60  Pulse: 69 72  Temp: 98.4 F (36.9 C) 98.2 F (36.8 C)  Resp: 18 18     Intake/Output from previous day:  04/21 0701 - 04/22 0700 In: 2875 [P.O.:480; I.V.:2395] Out: 2750 [Urine:2750]  Intake/Output this shift:      Physical Exam:   General: Obese AA F who is alert and oriented.  Marland Kitchen   HEENT: Normal. Pupils equal. .   Lungs: Clear.     Abdomen: Soft.     Wound: Clean   Lab Results:     Recent Labs  01/15/16 1540 01/16/16 0436  WBC  --  10.2  HGB 9.1* 8.3*  HCT 30.2* 27.3*  PLT  --  373    BMET  No results for input(s): NA, K, CL, CO2, GLUCOSE, BUN, CREATININE, CALCIUM in the last 72 hours.  PT/INR  No results for input(s): LABPROT, INR in the last 72 hours.  ABG  No results for input(s): PHART, HCO3 in the last 72 hours.  Invalid input(s): PCO2, PO2   Studies/Results:  No results found.   Anti-infectives:   Anti-infectives    Start     Dose/Rate Route Frequency Ordered Stop   01/14/16 0800  vancomycin (VANCOCIN) 500 mg in sodium chloride 0.9 % 100 mL IVPB     500 mg 100 mL/hr over 60 Minutes Intravenous  Once 01/14/16 0752 01/14/16 0850   01/14/16 0700  gentamicin (GARAMYCIN) 450 mg in dextrose 5 % 100 mL IVPB     5 mg/kg  90.9 kg (Adjusted) 222.5 mL/hr over 30 Minutes  Intravenous  Once 01/14/16 0645 01/14/16 0822   01/14/16 0700  vancomycin (VANCOCIN) 1,000 mg in sodium chloride 0.9 % 250 mL IVPB     1,000 mg 250 mL/hr over 60 Minutes Intravenous  Once 01/14/16 0708 01/14/16 0800   01/14/16 0600  gentamicin (GARAMYCIN) 450 mg in dextrose 5 % 50 mL IVPB  Status:  Discontinued     5 mg/kg  90.9 kg (Adjusted) 122.5 mL/hr over 30 Minutes Intravenous  Once 01/13/16 1344 01/14/16 0646   01/14/16 0530  vancomycin (VANCOCIN) powder 1,000 mg  Status:  Discontinued     1,000 mg Other To Surgery 01/14/16 0521 67/01/41 0301      Joe Tanney C Dezmen Alcock, MD  [facility]  01/18/2016

## 2016-01-19 MED ORDER — DIPHENHYDRAMINE HCL 25 MG PO CAPS
25.0000 mg | ORAL_CAPSULE | Freq: Four times a day (QID) | ORAL | Status: DC | PRN
  Administered 2016-01-19: 25 mg via ORAL
  Filled 2016-01-19: qty 1

## 2016-01-19 NOTE — Progress Notes (Signed)
Assessment unchanged. Pt verbalized understanding of dc instructions through teach back including follow up care and when to call the doctor. Vilinda Flake, RN-bariatric nurse coordinator completed bariatric education on 4/21. Instructions reinforced. No scripts at dc. Discharged vai wc to front entrance to meet mom at front entrance. Accompanied by student nurse.

## 2016-01-19 NOTE — Discharge Summary (Signed)
Physician Discharge Summary  Patient ID:  [name]  MRN: [id number]  DOB/AGE: 1976-02-17 40 y.o.  Admit date: 01/14/2016 Discharge date: 01/19/2016  Discharge Diagnoses:  1.  Morbid Obesity (Weight - 295, BMI - 46.3) 2.  Prolonged post op nausea 3. 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 female whose primary care physician is SMITH,KRISTI, MD and who was admitted 01/14/2016 with a chief complaint of Morbid Obesity.   She was brought to the operating room on 01/14/2016 and underwent  LAPAROSCOPIC GASTRIC SLEEVE RESECTION.   Her swallow the first day post op was okay.  But [name] was plagued with nausea and vomiting early after surgery.  Cold fluids were particularly bad. She has learned to drink warm fluids, she is now 5 days post op and ready to go home.  The discharge instructions were reviewed with the patient.  Consults: None  Significant Diagnostic Studies: Results for orders placed or performed during the hospital encounter of 01/14/16  Hemoglobin and hematocrit, blood  Result Value Ref Range   Hemoglobin 9.1 (L) 12.0 - 15.0 g/dL   HCT 29.6 (L) 36.0 - 46.0 %  CBC WITH DIFFERENTIAL  Result Value Ref Range   WBC 16.6 (H) 4.0 - 10.5 K/uL   RBC 3.56 (L) 3.87 - 5.11 MIL/uL   Hemoglobin 8.4 (L) 12.0 - 15.0 g/dL   HCT 27.1 (L) 36.0 - 46.0 %   MCV 76.1 (L) 78.0 - 100.0 fL   MCH 23.6 (L) 26.0 - 34.0 pg   MCHC 31.0 30.0 - 36.0 g/dL   RDW 16.3 (H) 11.5 - 15.5 %   Platelets 438 (H) 150 - 400 K/uL   Neutrophils Relative % 78 %   Neutro Abs 12.8 (H) 1.7 - 7.7 K/uL   Lymphocytes Relative 16 %   Lymphs Abs 2.6 0.7 - 4.0 K/uL   Monocytes Relative 7 %   Monocytes Absolute 1.1 (H) 0.1 - 1.0 K/uL   Eosinophils Relative 0 %   Eosinophils  Absolute 0.0 0.0 - 0.7 K/uL   Basophils Relative 0 %   Basophils Absolute 0.0 0.0 - 0.1 K/uL  Hemoglobin and hematocrit, blood  Result Value Ref Range   Hemoglobin 9.1 (L) 12.0 - 15.0 g/dL   HCT 30.2 (L) 36.0 - 46.0 %  CBC with Differential  Result Value Ref Range   WBC 10.2 4.0 - 10.5 K/uL   RBC 3.62 (L) 3.87 - 5.11 MIL/uL   Hemoglobin 8.3 (L) 12.0 - 15.0 g/dL   HCT 27.3 (L) 36.0 - 46.0 %   MCV 75.4 (L) 78.0 - 100.0 fL   MCH 22.9 (L) 26.0 - 34.0 pg   MCHC 30.4 30.0 - 36.0 g/dL   RDW 16.2 (H) 11.5 - 15.5 %   Platelets 373 150 - 400 K/uL   Neutrophils Relative % 56 %   Neutro Abs 5.7 1.7 - 7.7 K/uL   Lymphocytes Relative 37 %   Lymphs Abs 3.7 0.7 - 4.0 K/uL   Monocytes Relative 5 %   Monocytes Absolute 0.6 0.1 - 1.0 K/uL   Eosinophils Relative 2 %   Eosinophils Absolute 0.2 0.0 - 0.7 K/uL   Basophils Relative 0 %   Basophils Absolute 0.0 0.0 - 0.1 K/uL    Dg Ugi  W/water Sol Cm  01/15/2016  CLINICAL DATA:  Status post gastric sleeve surgery. EXAM: WATER SOLUBLE UPPER GI SERIES TECHNIQUE: Single-column upper GI series was performed using water soluble contrast. CONTRAST:  50 cc of Omni 300 COMPARISON:  11/07/2015 FLUOROSCOPY TIME:  Radiation Exposure Index (as provided by the fluoroscopic device): 162 If the device does not provide the exposure index: Fluoroscopy Time (in minutes and seconds):  30 seconds Number of Acquired Images: FINDINGS: On the scout radiograph there are surgical clips noted within the left upper quadrant of the abdomen. No dilated loops of bowel identified. Following the oral ingestion of the contrast material there is prompt opacification of the gastric remnant. No extravasation of contrast material identified. Antegrade progression of the contrast material through the gastric lumen into the proximal small bowel noted. IMPRESSION: 1. No extravasation of contrast material identified to suggest postoperative leak. Electronically Signed   By: Kerby Moors M.D.   On:  01/15/2016 10:08    Discharge Exam:  Filed Vitals:   01/19/16 1013 01/19/16 1324  BP: 120/72 143/86  Pulse: 68 79  Temp: 98.1 F (36.7 C) 99.1 F (37.3 C)  Resp: 18 18    General: Obese AA F who is alert and generally healthy appearing.  Lungs: Clear to auscultation and symmetric breath sounds. Heart:  RRR. No murmur or rub. Abdomen: Soft.  Normal bowel sounds. Her incisions are okay.  Discharge Medications:     Medication List    STOP taking these medications        diclofenac 75 MG EC tablet  Commonly known as:  VOLTAREN      TAKE these medications        acetaminophen 500 MG tablet  Commonly known as:  TYLENOL  Take 500 mg by mouth every 6 (six) hours as needed for mild pain.     albuterol 108 (90 Base) MCG/ACT inhaler  Commonly known as:  PROVENTIL HFA;VENTOLIN HFA  Inhale 2 puffs into the lungs every 6 (six) hours as needed for wheezing or shortness of breath. For shortness of breath     EPINEPHrine 0.3 mg/0.3 mL Soaj injection  Commonly known as:  EPI-PEN  Inject 0.3 mg into the muscle once.     fluticasone 50 MCG/ACT nasal spray  Commonly known as:  FLONASE  Place into both nostrils daily.     ibuprofen 200 MG tablet  Commonly known as:  ADVIL,MOTRIN  Take 400 mg by mouth every 6 (six) hours as needed for headache or moderate pain. Reported on 11/05/2015  Notes to Patient:  Avoid NSAIDs for 6-8 weeks after surgery      ipratropium 0.03 % nasal spray  Commonly known as:  ATROVENT  Place 2 sprays into both nostrils 2 (two) times daily.     mesalamine 1000 MG suppository  Commonly known as:  CANASA  Place 1 suppository (1,000 mg total) rectally at bedtime.     phentermine 37.5 MG capsule  Take 1 capsule (37.5 mg total) by mouth every morning.     Vitamin D (Ergocalciferol) 50000 units Caps capsule  Commonly known as:  DRISDOL  Take 1 capsule (50,000 Units total) by mouth every 7 (seven) days.        Disposition: 01-Home or Self Care       Discharge Instructions    Ambulate hourly while awake    Complete by:  As directed      Call MD for:  difficulty breathing, headache or visual disturbances  Complete by:  As directed      Call MD for:  persistant dizziness or light-headedness    Complete by:  As directed      Call MD for:  persistant nausea and vomiting    Complete by:  As directed      Call MD for:  redness, tenderness, or signs of infection (pain, swelling, redness, odor or green/yellow discharge around incision site)    Complete by:  As directed      Call MD for:  severe uncontrolled pain    Complete by:  As directed      Call MD for:  temperature >101 F    Complete by:  As directed      Diet bariatric full liquid    Complete by:  As directed      Incentive spirometry    Complete by:  As directed   Perform hourly while awake           Follow-up Information    Follow up with Edward Jolly, MD. Go on 01/31/2016.   Specialty:  General Surgery   Why:  For Post-Op Check at 11:15   Contact information:   [address] [place] [address] [phone]       Follow up with Edward Jolly, MD. Go on 02/27/2016.   Specialty:  General Surgery   Why:  For Post-Op Check at 12:00   Contact information:   [address] [place] [address] [phone]        Signed: Alphonsa Overall, M.D., [facility] Surgery Office:  [phone]  01/19/2016, 1:57 PM

## 2016-01-21 ENCOUNTER — Inpatient Hospital Stay (HOSPITAL_COMMUNITY)
Admission: EM | Admit: 2016-01-21 | Discharge: 2016-01-24 | DRG: 392 | Disposition: A | Discharge status: Home / Self Care | Payer: 59 | Attending: General Surgery | Admitting: General Surgery

## 2016-01-21 ENCOUNTER — Encounter (HOSPITAL_COMMUNITY): Payer: Self-pay | Admitting: *Deleted

## 2016-01-21 DIAGNOSIS — Z885 Allergy status to narcotic agent status: Secondary | ICD-10-CM | POA: Diagnosis not present

## 2016-01-21 DIAGNOSIS — Z7951 Long term (current) use of inhaled steroids: Secondary | ICD-10-CM | POA: Diagnosis not present

## 2016-01-21 DIAGNOSIS — Y838 Other surgical procedures as the cause of abnormal reaction of the patient, or of later complication, without mention of misadventure at the time of the procedure: Secondary | ICD-10-CM | POA: Diagnosis present

## 2016-01-21 DIAGNOSIS — Z87891 Personal history of nicotine dependence: Secondary | ICD-10-CM

## 2016-01-21 DIAGNOSIS — R112 Nausea with vomiting, unspecified: Secondary | ICD-10-CM

## 2016-01-21 DIAGNOSIS — Z880 Allergy status to penicillin: Secondary | ICD-10-CM

## 2016-01-21 DIAGNOSIS — Z888 Allergy status to other drugs, medicaments and biological substances status: Secondary | ICD-10-CM

## 2016-01-21 DIAGNOSIS — K910 Vomiting following gastrointestinal surgery: Secondary | ICD-10-CM | POA: Diagnosis not present

## 2016-01-21 DIAGNOSIS — Z79899 Other long term (current) drug therapy: Secondary | ICD-10-CM

## 2016-01-21 DIAGNOSIS — Z881 Allergy status to other antibiotic agents status: Secondary | ICD-10-CM

## 2016-01-21 DIAGNOSIS — Z9889 Other specified postprocedural states: Secondary | ICD-10-CM | POA: Diagnosis present

## 2016-01-21 DIAGNOSIS — M1990 Unspecified osteoarthritis, unspecified site: Secondary | ICD-10-CM | POA: Diagnosis present

## 2016-01-21 DIAGNOSIS — J45909 Unspecified asthma, uncomplicated: Secondary | ICD-10-CM | POA: Diagnosis present

## 2016-01-21 HISTORY — DX: Nausea with vomiting, unspecified: R11.2

## 2016-01-21 HISTORY — DX: Other specified postprocedural states: Z98.890

## 2016-01-21 LAB — COMPREHENSIVE METABOLIC PANEL
ALBUMIN: 4.1 g/dL (ref 3.5–5.0)
ALT: 21 U/L (ref 14–54)
AST: 23 U/L (ref 15–41)
Alkaline Phosphatase: 72 U/L (ref 38–126)
Anion gap: 12 (ref 5–15)
BUN: 15 mg/dL (ref 6–20)
CHLORIDE: 105 mmol/L (ref 101–111)
CO2: 19 mmol/L — ABNORMAL LOW (ref 22–32)
CREATININE: 0.99 mg/dL (ref 0.44–1.00)
Calcium: 9.5 mg/dL (ref 8.9–10.3)
GFR calc Af Amer: >60 mL/min (ref >60)
GLUCOSE: 71 mg/dL (ref 65–99)
POTASSIUM: 4.1 mmol/L (ref 3.5–5.1)
SODIUM: 136 mmol/L (ref 135–145)
TOTAL PROTEIN: 8.6 g/dL — AB (ref 6.5–8.1)
Total Bilirubin: 0.7 mg/dL (ref 0.3–1.2)

## 2016-01-21 LAB — CBC
HEMATOCRIT: 32 % — AB (ref 36.0–46.0)
Hemoglobin: 9.8 g/dL — ABNORMAL LOW (ref 12.0–15.0)
MCH: 22.5 pg — ABNORMAL LOW (ref 26.0–34.0)
MCHC: 30.6 g/dL (ref 30.0–36.0)
MCV: 73.6 fL — AB (ref 78.0–100.0)
PLATELETS: 464 10*3/uL — AB (ref 150–400)
RBC: 4.35 MIL/uL (ref 3.87–5.11)
RDW: 15.9 % — AB (ref 11.5–15.5)
WBC: 14.4 10*3/uL — ABNORMAL HIGH (ref 4.0–10.5)

## 2016-01-21 LAB — LIPASE, BLOOD: LIPASE: 26 U/L (ref 11–51)

## 2016-01-21 MED ORDER — FENTANYL CITRATE (PF) 100 MCG/2ML IJ SOLN
12.5000 ug | INTRAMUSCULAR | Status: DC | PRN
  Administered 2016-01-21 – 2016-01-23 (×5): 12.5 ug via INTRAVENOUS
  Filled 2016-01-21 (×5): qty 2

## 2016-01-21 MED ORDER — PANTOPRAZOLE SODIUM 40 MG IV SOLR
40.0000 mg | Freq: Two times a day (BID) | INTRAVENOUS | Status: DC
  Administered 2016-01-21 – 2016-01-24 (×6): 40 mg via INTRAVENOUS
  Filled 2016-01-21 (×7): qty 40

## 2016-01-21 MED ORDER — CETYLPYRIDINIUM CHLORIDE 0.05 % MT LIQD
7.0000 mL | Freq: Two times a day (BID) | OROMUCOSAL | Status: DC
  Administered 2016-01-22 (×2): 7 mL via OROMUCOSAL

## 2016-01-21 MED ORDER — ALBUTEROL SULFATE (2.5 MG/3ML) 0.083% IN NEBU: 3.0000 mL | INHALATION_SOLUTION | Freq: Four times a day (QID) | RESPIRATORY_TRACT | Status: DC | PRN

## 2016-01-21 MED ORDER — ONDANSETRON HCL 4 MG/2ML IJ SOLN
4.0000 mg | Freq: Four times a day (QID) | INTRAMUSCULAR | Status: DC | PRN
  Administered 2016-01-21 – 2016-01-22 (×2): 4 mg via INTRAVENOUS
  Filled 2016-01-21 (×2): qty 2

## 2016-01-21 MED ORDER — ENOXAPARIN SODIUM 80 MG/0.8ML ~~LOC~~ SOLN
0.5000 mg/kg | SUBCUTANEOUS | Status: DC
  Administered 2016-01-21 – 2016-01-23 (×3): 65 mg via SUBCUTANEOUS
  Filled 2016-01-21 (×3): qty 0.8

## 2016-01-21 MED ORDER — ONDANSETRON 4 MG PO TBDP: 4.0000 mg | ORAL_TABLET | Freq: Four times a day (QID) | ORAL | Status: DC | PRN

## 2016-01-21 MED ORDER — PROCHLORPERAZINE EDISYLATE 5 MG/ML IJ SOLN
10.0000 mg | INTRAMUSCULAR | Status: DC | PRN
  Administered 2016-01-22 (×3): 10 mg via INTRAVENOUS
  Filled 2016-01-21 (×3): qty 2

## 2016-01-21 MED ORDER — KCL IN DEXTROSE-NACL 20-5-0.9 MEQ/L-%-% IV SOLN
INTRAVENOUS | Status: DC
  Administered 2016-01-21: 19:00:00 via INTRAVENOUS
  Administered 2016-01-22: 1000 mL via INTRAVENOUS
  Administered 2016-01-23 – 2016-01-24 (×3): via INTRAVENOUS
  Filled 2016-01-21 (×8): qty 1000

## 2016-01-21 NOTE — ED Notes (Addendum)
Pt reports she was discharged from the hospital Sunday for n/v.  Had a gastric sleeve placed Tuesday last week.  States that she was still vomiting.  States "I don't want to bother my doctor because I have an appt with him next week."  Pt reports nausea med rx to her is not helping.  States she is not able to keep anything down.  Pt also reports abd pain.

## 2016-01-21 NOTE — ED Notes (Signed)
Pt just got out of hospital - doesn't have much to stick.  Couldnt get more that 0.5 cc -  Has opted to wait for IV - pt knows if they cannot get blood with IV start, that ill come back and do [place] blood draw.

## 2016-01-21 NOTE — H&P (Signed)
[name] is an 40 y.o. female.    Chief Complaint: Nausea and vomiting post sleeve gastrectomy  HPI: patient is a 40 year old female with morbid obesity who is one-week status post laparoscopic sleeve gastrectomy.  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 Her procedure was apparently uncomplicated. Her sleeve appeared normal laparoscopically although there was an area of relative narrowing in the mid body of the stomach on intraoperative endoscopy that could be traversed with the scope. Postoperatively she had a lot of nausea immediately. Gastrografin study on the first postoperative day showed no obstruction and prompt passage of contrast without leak although there can be seen a relative area of narrowing without obstruction in the mid body of the stomach. She continued to have some persistent nausea postoperatively but was discharged on the  Fifth postoperative day, 2 days ago tolerating fluids. She states however that soon after getting home she again developed nausea. Today she had repeated retching and dry heaving and vomiting "foamy". No hematemesis. She called the office and I asked her to come to the emergency department for evaluation. She is having some moderate abdominal pain which she rates it about a 5 and feels it is more from vomiting that anything else. He felt a little chilled once but no definite fever. Passing flatus.  Past Medical History  Diagnosis Date  . Bronchitis   . Asthma   . Clostridium difficile infection 04/20/2012  . Obesity   . Osteoarthritis     Both knees  . Shortness of breath     on exertion  . Internal and external bleeding hemorrhoids 06/11/2014  . Left sided chronic colitis - segmental 06/11/2014  . TOBACCO USER 10/02/2009    Qualifier: Diagnosis of  By: Dimas Millin MD, Ellard Artis    . Anal fissure - posterior 10/16/2014  . IDA (iron deficiency anemia)   . Ulcerative colitis ([place])   . Motion sickness     Past Surgical History  Procedure  Laterality Date  . Hemrrhoid surgery    . Colonoscopy  2007    for rectal bleeding; [facility]  . Laparoscopic tubal ligation  10/16/2011    Procedure: LAPAROSCOPIC TUBAL LIGATION;  Surgeon: Alwyn Pea, MD;  Location: [facility];  Service: Gynecology;  Laterality: Bilateral;  . Foot surgery    . Tubal ligation    . Colonoscopy N/A 06/11/2014    Procedure: COLONOSCOPY;  Surgeon: Gatha Mayer, MD;  Location: [facility];  Service: Endoscopy;  Laterality: N/A;  . Knee arthroscopy Left 09/06/2014  . Novasure ablation  09/28/2010    mild persistent vaginal bleeding  . Dilation and curettage of uterus    . Laparoscopic gastric sleeve resection N/A 01/14/2016    Procedure: LAPAROSCOPIC GASTRIC SLEEVE RESECTION;  Surgeon: Excell Seltzer, MD;  Location: [facility];  Service: General;  Laterality: N/A;    Family History  Problem Relation Age of Onset  . Hypertension Mother   . Diabetes Mother   . Sarcoidosis Mother     lungs and skin  . Asthma Mother   . Hypertension Father   . Diabetes Father   . Asthma Son   . Asthma Sister   . Cancer Sister     possible pancreatic cancer  . Asthma Brother   . Asthma Daughter     died age 62.5  . Cancer Daughter 4    brain; died age 62.5  . Asthma Son    Social History:  reports that she quit smoking about 22  months ago. Her smoking use included Cigarettes. She has a 10 pack-year smoking history. She has never used smokeless tobacco. She reports that she does not drink alcohol or use illicit drugs.  Allergies:  Allergies  Allergen Reactions  . Oxycodone Anaphylaxis  . Ciprofloxacin Itching and Rash  . Doxycycline Nausea And Vomiting  . Morphine And Related Swelling    Pt reported throat swelling to morphine.  Has tolerated Norco and Dilaudid.  Marland Kitchen Penicillins Rash    .Marland KitchenHas patient had a PCN reaction causing immediate rash, facial/tongue/throat swelling, SOB or lightheadedness with hypotension: Yes Has patient had a PCN reaction causing severe rash  involving mucus membranes or skin necrosis: No Has patient had a PCN reaction that required hospitalization UNKNOWN - childhood allergy  Has patient had a PCN reaction occurring within the last 10 years: No If all of the above answers are "NO", then may proceed with Cephalosporin use.    No current facility-administered medications for this encounter.   Current Outpatient Prescriptions  Medication Sig Dispense Refill  . acetaminophen (TYLENOL) 500 MG tablet Take 500 mg by mouth every 6 (six) hours as needed for mild pain.    Marland Kitchen albuterol (PROVENTIL HFA;VENTOLIN HFA) 108 (90 Base) MCG/ACT inhaler Inhale 2 puffs into the lungs every 6 (six) hours as needed for wheezing or shortness of breath. For shortness of breath 18 g 2  . EPINEPHrine 0.3 mg/0.3 mL IJ SOAJ injection Inject 0.3 mg into the muscle once.    . fluticasone (FLONASE) 50 MCG/ACT nasal spray Place into both nostrils daily.    Marland Kitchen ibuprofen (ADVIL,MOTRIN) 200 MG tablet Take 400 mg by mouth every 6 (six) hours as needed for headache or moderate pain. Reported on 11/05/2015    . ipratropium (ATROVENT) 0.03 % nasal spray Place 2 sprays into both nostrils 2 (two) times daily. (Patient not taking: Reported on 01/06/2016) 30 mL 0  . mesalamine (CANASA) 1000 MG suppository Place 1 suppository (1,000 mg total) rectally at bedtime. (Patient not taking: Reported on 01/06/2016) 9 suppository 0  . phentermine 37.5 MG capsule Take 1 capsule (37.5 mg total) by mouth every morning. 30 capsule 1  . Vitamin D, Ergocalciferol, (DRISDOL) 50000 units CAPS capsule Take 1 capsule (50,000 Units total) by mouth every 7 (seven) days. 12 capsule 0     No results found for this or any previous visit (from the past 48 hour(s)). No results found.  Review of Systems  Constitutional: Positive for chills and malaise/fatigue. Negative for fever.  Respiratory: Negative.   Cardiovascular: Negative.   Gastrointestinal: Positive for nausea, vomiting and abdominal pain.  Negative for diarrhea, constipation and blood in stool.  Genitourinary: Negative.   Musculoskeletal: Positive for joint pain.    Blood pressure 111/72, pulse 82, temperature 98.3 F (36.8 C), temperature source Oral, resp. rate 16, height 5' 7"  (1.702 m), weight 130.636 kg (288 lb), last menstrual period 01/15/2016, SpO2 100 %. Physical Exam  General: Alert, obese African-American female, in no distress Skin: Warm and dry without rash or infection. HEENT: No palpable masses or thyromegaly. Sclera nonicteric. Oropharynx clear. Lymph nodes: No cervical, supraclavicular,  nodes palpable. Lungs: Breath sounds clear and equal without increased work of breathing Cardiovascular: Regular rate and rhythm without murmur. No JVD or edema. Peripheral pulses intact. Abdomen: Nondistended. Soft and nontender. Incisions clean and healing nicely. No palpable hernias. Bowel sounds present but hypoactive Extremities: No edema or joint swelling or deformity. No chronic venous stasis changes. Neurologic: Alert and fully oriented. No  gross motor deficits. Affect normal.  Assessment/Plan Persistent nausea and vomiting post sleeve gastrectomy.  History of ulcerative colitis. Possible area of narrowing mid body of stomach on endoscopy intraoperatively and upper GI series but no obstruction. Possible postoperative edema or gastritis. She is not having a great deal of pain or any tenderness to suggest leak or other complication. Patient will be admitted for symptom management and IV hydration. Check lab. Possibly reimage if there are concerns on lab work or she does not improve.  Edward Jolly, MD 01/21/2016, 4:30 PM

## 2016-01-22 ENCOUNTER — Inpatient Hospital Stay (HOSPITAL_COMMUNITY): Payer: 59

## 2016-01-22 LAB — BASIC METABOLIC PANEL
ANION GAP: 9 (ref 5–15)
BUN: 11 mg/dL (ref 6–20)
CALCIUM: 8.5 mg/dL — AB (ref 8.9–10.3)
CO2: 19 mmol/L — AB (ref 22–32)
CREATININE: 0.81 mg/dL (ref 0.44–1.00)
Chloride: 111 mmol/L (ref 101–111)
Glucose, Bld: 87 mg/dL (ref 65–99)
Potassium: 3.8 mmol/L (ref 3.5–5.1)
SODIUM: 139 mmol/L (ref 135–145)

## 2016-01-22 LAB — CBC
HCT: 27.4 % — ABNORMAL LOW (ref 36.0–46.0)
HEMOGLOBIN: 8.4 g/dL — AB (ref 12.0–15.0)
MCH: 23 pg — AB (ref 26.0–34.0)
MCHC: 30.7 g/dL (ref 30.0–36.0)
MCV: 75.1 fL — ABNORMAL LOW (ref 78.0–100.0)
PLATELETS: 359 10*3/uL (ref 150–400)
RBC: 3.65 MIL/uL — AB (ref 3.87–5.11)
RDW: 16.2 % — ABNORMAL HIGH (ref 11.5–15.5)
WBC: 8.7 10*3/uL (ref 4.0–10.5)

## 2016-01-22 LAB — URINALYSIS, ROUTINE W REFLEX MICROSCOPIC
Glucose, UA: NEGATIVE mg/dL
Hgb urine dipstick: NEGATIVE
Ketones, ur: 40 mg/dL — AB
LEUKOCYTES UA: NEGATIVE
NITRITE: NEGATIVE
PH: 5.5 (ref 5.0–8.0)
Protein, ur: NEGATIVE mg/dL
SPECIFIC GRAVITY, URINE: 1.03 (ref 1.005–1.030)

## 2016-01-22 MED ORDER — DIATRIZOATE MEGLUMINE & SODIUM 66-10 % PO SOLN
30.0000 mL | Freq: Once | ORAL | Status: AC
Start: 2016-01-22 — End: 2016-01-22
  Administered 2016-01-22: 30 mL via ORAL
  Filled 2016-01-22: qty 30

## 2016-01-22 MED ORDER — SODIUM CHLORIDE 0.9% FLUSH: 10.0000 mL | INTRAVENOUS | Status: DC | PRN

## 2016-01-22 MED ORDER — SODIUM CHLORIDE 0.9% FLUSH
10.0000 mL | Freq: Two times a day (BID) | INTRAVENOUS | Status: DC
  Administered 2016-01-22: 10 mL
  Administered 2016-01-22: 30 mL
  Administered 2016-01-23: 10 mL

## 2016-01-22 MED ORDER — IOPAMIDOL (ISOVUE-300) INJECTION 61%
100.0000 mL | Freq: Once | INTRAVENOUS | Status: AC | PRN
  Administered 2016-01-22: 100 mL via INTRAVENOUS

## 2016-01-22 NOTE — Progress Notes (Signed)
Patient ID: [name], female   DOB: Nov 27, 1975, 40 y.o.   MRN: [id number]    Subjective: States she feels better this morning. Was still having some pain earlier this morning rated as high as 7.  Better with pain medication. No nausea or vomiting since not trying to drink. She was sleeping when I went in the room.  Objective: Vital signs in last 24 hours: Temp:  [98.1 F (36.7 C)-98.3 F (36.8 C)] 98.1 F (36.7 C) (04/26 0601) Pulse Rate:  [64-82] 64 (04/26 0601) Resp:  [16-17] 17 (04/26 0601) BP: (111-148)/(72-92) 120/75 mmHg (04/26 0601) SpO2:  [100 %] 100 % (04/26 0601) Weight:  [130.636 kg (288 lb)] 130.636 kg (288 lb) (04/25 1815) Last BM Date: 01/18/16 (has been on a liquid diet,takes in 2 oz @ atime/N&V )  Intake/Output from previous day: 04/25 0701 - 04/26 0700 In: -  Out: 300 [Urine:300] Intake/Output this shift:    General appearance: alert, cooperative and no distress Resp: clear to auscultation bilaterally GI: normal findings: soft, non-tender Incision/Wound: cclean and dry  Lab Results:   Recent Labs  01/21/16 1746 01/22/16 0339  WBC 14.4* 8.7  HGB 9.8* 8.4*  HCT 32.0* 27.4*  PLT 464* 359   BMET  Recent Labs  01/21/16 1746 01/22/16 0339  NA 136 139  K 4.1 3.8  CL 105 111  CO2 19* 19*  GLUCOSE 71 87  BUN 15 11  CREATININE 0.99 0.81  CALCIUM 9.5 8.5*     Studies/Results: No results found.  Anti-infectives: Anti-infectives    None      Assessment/Plan: Nausea and vomiting post sleeve gastrectomy.  Subjectively somewhat more pain  Than I would expect at this point,, white blood count mildly elevated at admission and now normal. Her abdomen is nontender on exam. I doubt  Leak but I think we should rule this out. I'm going to obtain a CT scan to evaluate for leak or obstruction. Continue nothing by mouth for now. Patient very stable.    LOS: 1 day    Alvis Pulcini T 01/22/2016

## 2016-01-22 NOTE — Progress Notes (Signed)
Peripherally Inserted Central Catheter/Midline Placement  The IV Nurse has discussed with the patient and/or persons authorized to consent for the patient, the purpose of this procedure and the potential benefits and risks involved with this procedure.  The benefits include less needle sticks, lab draws from the catheter and patient may be discharged home with the catheter.  Risks include, but not limited to, infection, bleeding, blood clot (thrombus formation), and puncture of an artery; nerve damage and irregular heat beat.  Alternatives to this procedure were also discussed.  PICC/Midline Placement Documentation        Darlyn Read 01/22/2016, 1:48 PM

## 2016-01-23 MED ORDER — UNJURY CHICKEN SOUP POWDER
2.0000 [oz_av] | Freq: Four times a day (QID) | ORAL | Status: DC
  Administered 2016-01-24: 2 [oz_av] via ORAL
  Filled 2016-01-23 (×6): qty 27

## 2016-01-23 NOTE — Progress Notes (Signed)
Patient ID: [name], female   DOB: 29-Aug-1976, 40 y.o.   MRN: [id number]    Subjective: No C/O this AM, denies nausea or pain  Objective: Vital signs in last 24 hours: Temp:  [97.7 F (36.5 C)-98.5 F (36.9 C)] 97.7 F (36.5 C) (04/27 0702) Pulse Rate:  [63-80] 80 (04/27 0702) Resp:  [16-18] 16 (04/27 0702) BP: (124-135)/(74-86) 127/86 mmHg (04/27 0702) SpO2:  [97 %-100 %] 97 % (04/27 0702) Last BM Date: 01/22/16  Intake/Output from previous day: 04/26 0701 - 04/27 0700 In: -  Out: 1200 [Urine:1200] Intake/Output this shift: Total I/O In: -  Out: 300 [Urine:300]  General appearance: alert, cooperative and no distress GI: normal findings: soft, non-tender Incision/Wound: Clean and dry  Lab Results:   Recent Labs  01/21/16 1746 01/22/16 0339  WBC 14.4* 8.7  HGB 9.8* 8.4*  HCT 32.0* 27.4*  PLT 464* 359   BMET  Recent Labs  01/21/16 1746 01/22/16 0339  NA 136 139  K 4.1 3.8  CL 105 111  CO2 19* 19*  GLUCOSE 71 87  BUN 15 11  CREATININE 0.99 0.81  CALCIUM 9.5 8.5*     Studies/Results: Ct Abdomen Pelvis W Contrast  01/22/2016  CLINICAL DATA:  Nausea and vomiting since sleeve gastrectomy. Abdominal pain. Rule out leak. Rule out obstruction. Prior tubal ligation. EXAM: CT ABDOMEN AND PELVIS WITH CONTRAST TECHNIQUE: Multidetector CT imaging of the abdomen and pelvis was performed using the standard protocol following bolus administration of intravenous contrast. CONTRAST:  118m ISOVUE-300 IOPAMIDOL (ISOVUE-300) INJECTION 61% COMPARISON:  Upper GI of 01/15/2016.  CT of 04/20/2012. FINDINGS: Lower chest: Clear lung bases. Normal heart size. Trace left pleural. Hepatobiliary: Scattered hepatic lesions are too small to characterize. Normal gallbladder, without biliary ductal dilatation. Pancreas: Normal, without mass or ductal dilatation. Spleen: Normal in size, without focal abnormality. Adrenals/Urinary Tract: Normal adrenal glands. Normal kidneys, without  hydronephrosis. Normal urinary bladder. Stomach/Bowel: Status post sleeve gastrectomy. No evidence of perigastric edema, fluid, or extraluminal contrast. Apparent wall thickening involving the ascending colon is felt to be due to a combination of underdistention and nonspecific submucosal fat prominence. Example image 34/series 2. Normal terminal ileum and appendix. Normal small bowel. Vascular/Lymphatic: Normal caliber of the aorta and branch vessels. No abdominopelvic adenopathy. Reproductive: Normal uterus and adnexa. Other: No significant free fluid. No free intraperitoneal air. Right anterior abdominal wall subcutaneous edema on image 49/series 2 is likely postoperative. Similar findings more superiorly in the left side of the abdomen. Musculoskeletal: Bilateral L5 pars defects, without malalignment. IMPRESSION: 1. Status post sleeve gastrectomy, without evidence of obstruction or leak. 2. Trace left pleural fluid. 3. Apparent right-sided colonic wall thickening, favored to be due to a combination of underdistention and nonspecific submucosal fat prominence (which has been described in patients with inflammatory bowel disease and as a normal variant). Mild infectious colitis cannot be Excluded but is felt unlikely. No other explanation for patient's symptoms. 4. Bilateral pars defects. Electronically Signed   By: KAbigail MiyamotoM.D.   On: 01/22/2016 18:58    Anti-infectives: Anti-infectives    None      Assessment/Plan: Post op N/V s/p sleeve gastrectomy.  No obstruction, leak or other specific complication identified.  Doing well currently. Start protein shakes.    LOS: 2 days    [name] T 01/23/2016

## 2016-01-24 NOTE — Discharge Summary (Signed)
   Patient ID: [name] [id number] 39 y.o. 06/03/1976  01/21/2016  Discharge date and time: 01/24/2016   Admitting Physician: Excell Seltzer T  Discharge Physician: Excell Seltzer T  Admission Diagnoses: Postoperative nausea and vomiting status post sleeve gastrectomy  Discharge Diagnoses: Same  Operations: None  Admission Condition: fair  Discharged Condition: good  Indication for Admission: Patient presents 1 week status post laparoscopic sleeve gastrectomy. Postoperatively she did have somewhat prolonged nausea and was discharged home on postop day #4. She did well for about 48 hours and then developed recurrent persistent nausea and vomiting and inability to tolerate any by mouth fluids or medications. She was feeling weak. Patient was admitted for further evaluation and rehydration.  Hospital Course: Patient was admitted and started on IV hydration and made nothing by mouth. Initial white blood count was elevated at 14,000 but returned to normal the following day. Her abdomen was benign. CT scan of the abdomen and pelvis was obtained which showed normal post sleeve gastrectomy anatomy without obstruction or leak or any other explanation for her symptoms. Her symptoms improved over 24-36 hours and she was started initially on clear liquids and then protein shakes. At the time of discharge she has been tolerating protein shakes for 24 hours with no difficulty and denies any nausea or pain. Bowels are moving. She feels ready for discharge.   Significant Diagnostic Studies: radiology: CT scan: negative  Treatments: IV hydration  Disposition: Home  Patient Instructions:    Medication List    TAKE these medications        acetaminophen 500 MG tablet  Commonly known as:  TYLENOL  Take 500 mg by mouth every 6 (six) hours as needed for mild pain.     albuterol 108 (90 Base) MCG/ACT inhaler  Commonly known as:  PROVENTIL HFA;VENTOLIN HFA  Inhale 2 puffs into the  lungs every 6 (six) hours as needed for wheezing or shortness of breath. For shortness of breath     CALCIUM CITRATE PO  Take 500 tablets by mouth 2 (two) times daily.     cyanocobalamin 500 MCG tablet  Take 500 mcg by mouth daily.     EPINEPHrine 0.3 mg/0.3 mL Soaj injection  Commonly known as:  EPI-PEN  Inject 0.3 mg into the muscle once.     fluticasone 50 MCG/ACT nasal spray  Commonly known as:  FLONASE  Place 1 spray into both nostrils daily as needed for allergies.     HYDROmorphone HCl 1 MG/ML Liqd  Commonly known as:  DILAUDID  Take 1 mg by mouth every 6 (six) hours as needed for severe pain.     MULTIVITAMIN & MINERAL PO  Take 1 tablet by mouth 2 (two) times daily.     ondansetron 4 MG disintegrating tablet  Commonly known as:  ZOFRAN-ODT     pantoprazole 40 MG tablet  Commonly known as:  PROTONIX  Take 40 mg by mouth daily.        Activity: activity as tolerated Diet: bariatric protein shakes Wound Care: none needed  Follow-up:  With Dr. Excell Seltzer in 3 weeks.  Signed: Edward Jolly MD, FACS  01/24/2016, 9:47 AM

## 2016-01-24 NOTE — Care Management Note (Signed)
Case Management Note  Patient Details  Name: [name] MRN: [id number] Date of Birth: [birth year]  Subjective/Objective:    Postoperative nausea and vomiting status post sleeve gastrectomy                Action/Plan: Discharge Planning: AVS reviewed: NCM spoke to pt at bedside. She can afford her medications. No NCM needs identified.  Maudie Flakes MD  Expected Discharge Date:  01/24/2016             Expected Discharge Plan:  Home/Self Care  In-House Referral:  NA  Discharge planning Services  CM Consult  Post Acute Care Choice:  NA Choice offered to:  NA  DME Arranged:  N/A DME Agency:  NA  HH Arranged:  NA HH Agency:  NA  Status of Service:  Completed, signed off  Medicare Important Message Given:    Date Medicare IM Given:    Medicare IM give by:    Date Additional Medicare IM Given:    Additional Medicare Important Message give by:     If discussed at [place] of Stay Meetings, dates discussed:    Additional Comments:  Erenest Rasher, RN 01/24/2016, 11:49 AM

## 2016-01-24 NOTE — Progress Notes (Signed)
Patient d/c home. Stable. Wheeled via wheelchair by NT.

## 2016-01-24 NOTE — Discharge Instructions (Signed)

## 2016-01-24 NOTE — Progress Notes (Signed)
Patient ID: [name], female   DOB: 04/08/1976, 40 y.o.   MRN: [id number]    Subjective: She is doing "awesome". Tolerating protein shakes without any pain or nausea. Has had a bowel movement.  Objective: Vital signs in last 24 hours: Temp:  [98.2 F (36.8 C)-98.8 F (37.1 C)] 98.2 F (36.8 C) (04/27 2126) Pulse Rate:  [76-83] 76 (04/27 2126) Resp:  [16] 16 (04/27 2126) BP: (115-125)/(68-88) 115/68 mmHg (04/27 2126) SpO2:  [100 %] 100 % (04/27 2126) Last BM Date: 01/23/16  Intake/Output from previous day: 04/27 0701 - 04/28 0700 In: 1670.7 [P.O.:1064; I.V.:606.7] Out: 700 [Urine:700] Intake/Output this shift:    General appearance: alert, cooperative and no distress GI: normal findings: soft, non-tender  Lab Results:   Recent Labs  01/21/16 1746 01/22/16 0339  WBC 14.4* 8.7  HGB 9.8* 8.4*  HCT 32.0* 27.4*  PLT 464* 359   BMET  Recent Labs  01/21/16 1746 01/22/16 0339  NA 136 139  K 4.1 3.8  CL 105 111  CO2 19* 19*  GLUCOSE 71 87  BUN 15 11  CREATININE 0.99 0.81  CALCIUM 9.5 8.5*     Studies/Results: Ct Abdomen Pelvis W Contrast  01/22/2016  CLINICAL DATA:  Nausea and vomiting since sleeve gastrectomy. Abdominal pain. Rule out leak. Rule out obstruction. Prior tubal ligation. EXAM: CT ABDOMEN AND PELVIS WITH CONTRAST TECHNIQUE: Multidetector CT imaging of the abdomen and pelvis was performed using the standard protocol following bolus administration of intravenous contrast. CONTRAST:  124m ISOVUE-300 IOPAMIDOL (ISOVUE-300) INJECTION 61% COMPARISON:  Upper GI of 01/15/2016.  CT of 04/20/2012. FINDINGS: Lower chest: Clear lung bases. Normal heart size. Trace left pleural. Hepatobiliary: Scattered hepatic lesions are too small to characterize. Normal gallbladder, without biliary ductal dilatation. Pancreas: Normal, without mass or ductal dilatation. Spleen: Normal in size, without focal abnormality. Adrenals/Urinary Tract: Normal adrenal glands. Normal  kidneys, without hydronephrosis. Normal urinary bladder. Stomach/Bowel: Status post sleeve gastrectomy. No evidence of perigastric edema, fluid, or extraluminal contrast. Apparent wall thickening involving the ascending colon is felt to be due to a combination of underdistention and nonspecific submucosal fat prominence. Example image 34/series 2. Normal terminal ileum and appendix. Normal small bowel. Vascular/Lymphatic: Normal caliber of the aorta and branch vessels. No abdominopelvic adenopathy. Reproductive: Normal uterus and adnexa. Other: No significant free fluid. No free intraperitoneal air. Right anterior abdominal wall subcutaneous edema on image 49/series 2 is likely postoperative. Similar findings more superiorly in the left side of the abdomen. Musculoskeletal: Bilateral L5 pars defects, without malalignment. IMPRESSION: 1. Status post sleeve gastrectomy, without evidence of obstruction or leak. 2. Trace left pleural fluid. 3. Apparent right-sided colonic wall thickening, favored to be due to a combination of underdistention and nonspecific submucosal fat prominence (which has been described in patients with inflammatory bowel disease and as a normal variant). Mild infectious colitis cannot be Excluded but is felt unlikely. No other explanation for patient's symptoms. 4. Bilateral pars defects. Electronically Signed   By: KAbigail MiyamotoM.D.   On: 01/22/2016 18:58    Anti-infectives: Anti-infectives    None      Assessment/Plan: Postoperative nausea and vomiting status post sleeve gastrectomy. Evaluation including CT scan negative for complication. Symptoms appear to have resolved. Discussed strategies for diet at home. OK for discharge.    LOS: 3 days    Zameria Vogl T 01/24/2016

## 2016-01-27 ENCOUNTER — Telehealth (HOSPITAL_COMMUNITY): Payer: Self-pay

## 2016-01-27 NOTE — Telephone Encounter (Signed)
Made discharge phone call to patient per DROP protocol. Asking the following questions.    1. Do you have someone to care for you now that you are home?  yes 2. Are you having pain now that is not relieved by your pain medication?  yes 3. Are you able to drink the recommended daily amount of fluids (48 ounces minimum/day) and protein (60-80 grams/day) as prescribed by the dietitian or nutritional counselor?  yes 4. Are you taking the vitamins and minerals as prescribed?  yes 5. Do you have the "on call" number to contact your surgeon if you have a problem or question?  yes 6. Are your incisions free of redness, swelling or drainage? (If steri strips, address that these can fall off, shower as tolerated) yes 7. Have your bowels moved since your surgery?  If not, are you passing gas?  yes 8. Are you up and walking 3-4 times per day?  yes

## 2016-01-28 ENCOUNTER — Encounter: Payer: 59 | Attending: General Surgery

## 2016-01-28 DIAGNOSIS — Z6841 Body Mass Index (BMI) 40.0 and over, adult: Secondary | ICD-10-CM | POA: Insufficient documentation

## 2016-01-28 DIAGNOSIS — M170 Bilateral primary osteoarthritis of knee: Secondary | ICD-10-CM | POA: Insufficient documentation

## 2016-01-28 NOTE — Progress Notes (Signed)
  Bariatric Class:  Appt start time: 1530 end time:  1630.  2 Week Post-Operative Nutrition Class  Patient was seen on 01/28/16 for Post-Operative Nutrition education at [facility].   Surgery date: 01/14/2016 Surgery type: sleeve gastrectomy [facility] weight at [facility]: 301 lbs on 11/18/2015, 302 lbs on 01/06/16 Weight today: 277 lbs  Weight change: 25 lbs  TANITA  BODY COMP RESULTS  01/06/16 01/28/16   BMI (kg/m^2) 47.3 43.4   Fat Mass (lbs) 171 154.5   Fat Free Mass (lbs) 131 122.5   Total Body Water (lbs) 96 89.5    The following the learning objectives were met by the patient during this course:  Identifies Phase 3A (Soft, High Proteins) Dietary Goals and will begin from 2 weeks post-operatively to 2 months post-operatively  Identifies appropriate sources of fluids and proteins   States protein recommendations and appropriate sources post-operatively  Identifies the need for appropriate texture modifications, mastication, and bite sizes when consuming solids  Identifies appropriate multivitamin and calcium sources post-operatively  Describes the need for physical activity post-operatively and will follow MD recommendations  States when to call healthcare provider regarding medication questions or post-operative complications  Handouts given during class include:  Phase 3A: Soft, High Protein Diet Handout  Follow-Up Plan: Patient will follow-up at [facility] in 6 weeks for 2 month post-op nutrition visit for diet advancement per MD.

## 2016-02-04 ENCOUNTER — Ambulatory Visit: Payer: 59

## 2016-02-12 ENCOUNTER — Ambulatory Visit (INDEPENDENT_AMBULATORY_CARE_PROVIDER_SITE_OTHER): Payer: 59 | Admitting: Family Medicine

## 2016-02-12 VITALS — BP 120/70 | HR 110 | Temp 98.5°F | Resp 17 | Ht 67.0 in | Wt 271.0 lb

## 2016-02-12 DIAGNOSIS — Z6841 Body Mass Index (BMI) 40.0 and over, adult: Secondary | ICD-10-CM | POA: Diagnosis not present

## 2016-02-12 DIAGNOSIS — Z9889 Other specified postprocedural states: Secondary | ICD-10-CM

## 2016-02-12 DIAGNOSIS — K219 Gastro-esophageal reflux disease without esophagitis: Secondary | ICD-10-CM | POA: Diagnosis not present

## 2016-02-12 DIAGNOSIS — E860 Dehydration: Secondary | ICD-10-CM

## 2016-02-12 DIAGNOSIS — R112 Nausea with vomiting, unspecified: Secondary | ICD-10-CM

## 2016-02-12 DIAGNOSIS — Z9884 Bariatric surgery status: Secondary | ICD-10-CM | POA: Diagnosis not present

## 2016-02-12 LAB — POC MICROSCOPIC URINALYSIS (UMFC)

## 2016-02-12 LAB — COMPREHENSIVE METABOLIC PANEL
ALK PHOS: 58 U/L (ref 33–115)
ALT: 10 U/L (ref 6–29)
AST: 17 U/L (ref 10–30)
Albumin: 3.9 g/dL (ref 3.6–5.1)
BUN: 12 mg/dL (ref 7–25)
CO2: 21 mmol/L (ref 20–31)
Calcium: 9.3 mg/dL (ref 8.6–10.2)
Chloride: 103 mmol/L (ref 98–110)
Creat: 0.76 mg/dL (ref 0.50–1.10)
GLUCOSE: 68 mg/dL (ref 65–99)
POTASSIUM: 3.8 mmol/L (ref 3.5–5.3)
SODIUM: 140 mmol/L (ref 135–146)
Total Bilirubin: 0.4 mg/dL (ref 0.2–1.2)
Total Protein: 7.3 g/dL (ref 6.1–8.1)

## 2016-02-12 LAB — POCT URINALYSIS DIP (MANUAL ENTRY)
Blood, UA: NEGATIVE
Glucose, UA: NEGATIVE
LEUKOCYTES UA: NEGATIVE
NITRITE UA: NEGATIVE
PH UA: 6
Spec Grav, UA: 1.015
Urobilinogen, UA: 1

## 2016-02-12 LAB — POCT CBC
Granulocyte percent: 63.6 %G (ref 37–80)
HCT, POC: 28.8 % — AB (ref 37.7–47.9)
HEMOGLOBIN: 9.6 g/dL — AB (ref 12.2–16.2)
Lymph, poc: 3.2 (ref 0.6–3.4)
MCH: 23.7 pg — AB (ref 27–31.2)
MCHC: 33.4 g/dL (ref 31.8–35.4)
MCV: 70.8 fL — AB (ref 80–97)
MID (cbc): 0.4 (ref 0–0.9)
MPV: 10.7 fL (ref 0–99.8)
PLATELET COUNT, POC: 264 10*3/uL (ref 142–424)
POC Granulocyte: 6.3 (ref 2–6.9)
POC LYMPH PERCENT: 32.6 %L (ref 10–50)
POC MID %: 3.8 % (ref 0–12)
RBC: 4.07 M/uL (ref 4.04–5.48)
RDW, POC: 17.6 %
WBC: 9.9 10*3/uL (ref 4.6–10.2)

## 2016-02-12 LAB — LIPASE: LIPASE: 26 U/L (ref 7–60)

## 2016-02-12 LAB — AMYLASE: AMYLASE: 38 U/L (ref 0–105)

## 2016-02-12 MED ORDER — SUCRALFATE 1 GM/10ML PO SUSP: 0.5000 g | Freq: Two times a day (BID) | ORAL | Status: DC

## 2016-02-12 MED ORDER — CYANOCOBALAMIN 1000 MCG/ML IJ SOLN
1000.0000 ug | Freq: Once | INTRAMUSCULAR | Status: AC
  Administered 2016-02-12: 1000 ug via INTRAMUSCULAR

## 2016-02-12 MED ORDER — LANSOPRAZOLE 3 MG/ML SUSP: 15.0000 mg | Freq: Two times a day (BID) | ORAL | Status: DC

## 2016-02-12 NOTE — Progress Notes (Signed)
Subjective:    Patient ID: [name], female    DOB: 02/04/1976, 40 y.o.   MRN: [id number]  02/12/2016  Dizziness and B12   HPI This 40 y.o. female presents for evaluation of post-operative nausea and vomiting after gastric sleeve.  Just had surgery on 01-14-2016.  Admission weight 301.  Readmitted on 01-19-2016 for post-operative nausea and vomiting. Discharged on 01-24-2016.  Since discharge, continues to suffer with nausea.  S/p UGI during the admission which was WNL.  Some swelling post-operatively.  S/p CT abd/pelvis that was WNL.  Swelling can persist for few weeks to few months.   Protein shakes, soft foods, full liquids.  Nothing stays down but water.  Urinating once daily.  No Zofran because not effective.  Prescribed promethazine suppositories. No Promethazine oral tablets.  Using suppositories a few days because not very effective.  Drinks 1-2 bottles of water per day; 16 ounce bottles.  Able to drink 2 ounces of protein shake twice daily.  Unable to tolerate MVI or calcium.  Focus on fluids; B12 is other thing; recommended injection for now.  Water causes nausea; tastes horrible.   No narcotics; only having pain at incision sites only.  Mild soreness.  Cannot take anything for knee inflammation; cannot take NSAIDs.  Follow-up with General Surgery 02-27-2016 [place].  Lots of reflux and heartburn.   Has only had one bowel movement since discharge.    Ingown toenail: s/p resected this week.  Going next week to have other toenail removed.   Long term disability currently; has been out of work 08/2014 due to knee issues.    Eating a thumb's worth of piece of chicken breast.  Must break into small pieces and gets tired. Has not sensation of fullness. Not hungry at all.  Does not get full.  Eating sugar free jello.    Review of Systems  Constitutional: Negative for fever, chills, diaphoresis and fatigue.  Eyes: Negative for visual disturbance.  Respiratory: Negative for cough and  shortness of breath.   Cardiovascular: Negative for chest pain, palpitations and leg swelling.  Gastrointestinal: Positive for nausea and vomiting. Negative for abdominal pain, diarrhea, constipation, blood in stool, abdominal distention and anal bleeding.  Endocrine: Negative for cold intolerance, heat intolerance, polydipsia, polyphagia and polyuria.  Genitourinary: Positive for decreased urine volume. Negative for dysuria and difficulty urinating.  Musculoskeletal: Positive for arthralgias.  Neurological: Negative for dizziness, tremors, seizures, syncope, facial asymmetry, speech difficulty, weakness, light-headedness, numbness and headaches.    Past Medical History  Diagnosis Date  . Bronchitis   . Asthma   . Clostridium difficile infection 04/20/2012  . Obesity   . Osteoarthritis     Both knees  . Shortness of breath     on exertion  . Internal and external bleeding hemorrhoids 06/11/2014  . Left sided chronic colitis - segmental 06/11/2014  . TOBACCO USER 10/02/2009    Qualifier: Diagnosis of  By: Dimas Millin MD, Ellard Artis    . Anal fissure - posterior 10/16/2014  . IDA (iron deficiency anemia)   . Ulcerative colitis ([place])   . Motion sickness   . Postoperative nausea and vomiting 01/21/2016   Past Surgical History  Procedure Laterality Date  . Hemrrhoid surgery    . Colonoscopy  2007    for rectal bleeding; [facility]  . Laparoscopic tubal ligation  10/16/2011    Procedure: LAPAROSCOPIC TUBAL LIGATION;  Surgeon: Alwyn Pea, MD;  Location: [facility];  Service: Gynecology;  Laterality: Bilateral;  .  Foot surgery    . Tubal ligation    . Colonoscopy N/A 06/11/2014    Procedure: COLONOSCOPY;  Surgeon: Gatha Mayer, MD;  Location: [facility];  Service: Endoscopy;  Laterality: N/A;  . Knee arthroscopy Left 09/06/2014  . Novasure ablation  09/28/2010    mild persistent vaginal bleeding  . Dilation and curettage of uterus    . Laparoscopic gastric sleeve resection N/A 01/14/2016     Procedure: LAPAROSCOPIC GASTRIC SLEEVE RESECTION;  Surgeon: Excell Seltzer, MD;  Location: [facility];  Service: General;  Laterality: N/A;   Allergies  Allergen Reactions  . Oxycodone Anaphylaxis  . Ciprofloxacin Itching and Rash  . Doxycycline Nausea And Vomiting  . Morphine And Related Swelling    Pt reported throat swelling to morphine.  Has tolerated Norco and Dilaudid.  Marland Kitchen Penicillins Rash    .Marland KitchenHas patient had a PCN reaction causing immediate rash, facial/tongue/throat swelling, SOB or lightheadedness with hypotension: Yes Has patient had a PCN reaction causing severe rash involving mucus membranes or skin necrosis: No Has patient had a PCN reaction that required hospitalization UNKNOWN - childhood allergy  Has patient had a PCN reaction occurring within the last 10 years: No If all of the above answers are "NO", then may proceed with Cephalosporin use.     Social History   Social History  . Marital Status: Single    Spouse Name: N/A  . Number of Children: N/A  . Years of Education: N/A   Occupational History  . Not on file.   Social History Main Topics  . Smoking status: Former Smoker -- 0.50 packs/day for 20 years    Types: Cigarettes    Quit date: 03/17/2014  . Smokeless tobacco: Never Used  . Alcohol Use: No  . Drug Use: No  . Sexual Activity: Yes    Birth Control/ Protection: Condom   Other Topics Concern  . Not on file   Social History Narrative   Marital status:  Single; not dating seriously.      Children:  3 sons (77, 22, 73), 1 daughter (deceased); no grandchildren      Lives:  With 2 sons      Employment:  Full time; Corporate treasurer at [employer]      Tobacco: quit June 2015      Alcohol:  None      Drugs:  None      Exercise:  Leg lifts for knee strengthening; walking   1 caffeine beverage daily      Sexual activity:  Active; total partners = up there.  No STDs in past.        Family History  Problem Relation Age of Onset  . Hypertension  Mother   . Diabetes Mother   . Sarcoidosis Mother     lungs and skin  . Asthma Mother   . Hypertension Father   . Diabetes Father   . Asthma Son   . Asthma Sister   . Cancer Sister     possible pancreatic cancer  . Adrenal disorder Sister     Tumor   . Asthma Brother   . Asthma Daughter     died age 79.5  . Cancer Daughter 4    brain; died age 79.5  . Asthma Son        Objective:    BP 120/70 mmHg  Pulse 110  Temp(Src) 98.5 F (36.9 C) (Oral)  Resp 17  Ht 5' 7"  (1.702 m)  Wt 271  lb (122.925 kg)  BMI 42.43 kg/m2  SpO2 100% Physical Exam  Constitutional: She is oriented to person, place, and time. She appears well-developed and well-nourished. No distress.  HENT:  Head: Normocephalic and atraumatic.  Right Ear: External ear normal.  Left Ear: External ear normal.  Nose: Nose normal.  Mouth/Throat: Oropharynx is clear and moist.  Eyes: Conjunctivae and EOM are normal. Pupils are equal, round, and reactive to light.  Neck: Normal range of motion. Neck supple. Carotid bruit is not present. No thyromegaly present.  Cardiovascular: Normal rate, regular rhythm, normal heart sounds and intact distal pulses.  Exam reveals no gallop and no friction rub.   No murmur heard. Pulmonary/Chest: Effort normal and breath sounds normal. She has no wheezes. She has no rales.  Abdominal: Soft. Bowel sounds are normal. She exhibits no distension and no mass. There is no tenderness. There is no rebound and no guarding.  Well healed incisions  Lymphadenopathy:    She has no cervical adenopathy.  Neurological: She is alert and oriented to person, place, and time. No cranial nerve deficit.  Skin: Skin is warm and dry. No rash noted. She is not diaphoretic. No erythema. No pallor.  Psychiatric: She has a normal mood and affect. Her behavior is normal.   Results for orders placed or performed in visit on 02/12/16  POCT urinalysis dipstick  Result Value Ref Range   Color, UA yellow yellow    Clarity, UA hazy (A) clear   Glucose, UA negative negative   Bilirubin, UA large (A) negative   Ketones, POC UA >= (160) (A) negative   Spec Grav, UA 1.015    Blood, UA negative negative   pH, UA 6.0    Protein Ur, POC =100 (A) negative   Urobilinogen, UA 1.0    Nitrite, UA Negative Negative   Leukocytes, UA Negative Negative  POCT Microscopic Urinalysis ([facility])  Result Value Ref Range   WBC,UR,HPF,POC Few (A) None WBC/hpf   RBC,UR,HPF,POC None None RBC/hpf   Bacteria Few (A) None, Too numerous to count   Mucus Present (A) Absent   Epithelial Cells, UR Per Microscopy Many (A) None, Too numerous to count cells/hpf   Casts, Ur, LPF, POC hyaline   POCT CBC  Result Value Ref Range   WBC 9.9 4.6 - 10.2 K/uL   Lymph, poc 3.2 0.6 - 3.4   POC LYMPH PERCENT 32.6 10 - 50 %L   MID (cbc) 0.4 0 - 0.9   POC MID % 3.8 0 - 12 %M   POC Granulocyte 6.3 2 - 6.9   Granulocyte percent 63.6 37 - 80 %G   RBC 4.07 4.04 - 5.48 M/uL   Hemoglobin 9.6 (A) 12.2 - 16.2 g/dL   HCT, POC 28.8 (A) 37.7 - 47.9 %   MCV 70.8 (A) 80 - 97 fL   MCH, POC 23.7 (A) 27 - 31.2 pg   MCHC 33.4 31.8 - 35.4 g/dL   RDW, POC 17.6 %   Platelet Count, POC 264 142 - 424 K/uL   MPV 10.7 0 - 99.8 fL   Tolerated 20 cc of apple juice x 2 twenty minutes apart without vomiting.    2 liters ivf administered in office.     Orthostatic VS for the past 24 hrs:  BP- Lying Pulse- Lying BP- Sitting Pulse- Sitting BP- Standing at 0 minutes Pulse- Standing at 0 minutes  02/12/16 1038 115/81 mmHg 71 114/81 mmHg 81 (!) 88/59 mmHg 134  Assessment & Plan:   1. Dehydration   2. Postoperative nausea and vomiting   3. Morbid obesity with BMI of 45.0-49.9, adult ([place])   4. S/P laparoscopic sleeve gastrectomy   5. Gastroesophageal reflux disease without esophagitis    -worsening/persistent. -s/p 2 liters normal saline in office. -stop Protonix; replace with Prevacid suspensioin 7m bid. -rx for carafate liquid yet expensive and  likely cannot afford. -decrease fluid intake to 20cc every 20 minutes. -OOW until Wednesday, Feb 19, 2016.  RTC in one week for recheck.  -recommend liquid MVI or Flinstone vitamin.   Orders Placed This Encounter  Procedures  . Comprehensive metabolic panel  . Lipase  . Amylase  . Orthostatic vital signs  . POCT urinalysis dipstick  . POCT Microscopic Urinalysis ([facility])  . POCT CBC   Meds ordered this encounter  Medications  . promethazine (PHENERGAN) 25 MG suppository    Sig: UNWRAP AND INSERT 1 SUPPOSITORY IN RECTUM EVERY EIGHT HOURS, AS NEEDED    Refill:  0  . phentermine 37.5 MG capsule    Sig: Take 37.5 mg by mouth every morning.    Refill:  1  . oxyCODONE (ROXICODONE) 5 MG/5ML solution    Sig:   . cyanocobalamin ((VITAMIN B-12)) injection 1,000 mcg    Sig:   . DISCONTD: lansoprazole (PREVACID) 3 mg/ml SUSP oral suspension    Sig: Take 5 mLs (15 mg total) by mouth 2 (two) times daily.    Dispense:  300 mL    Refill:  11  . lansoprazole (PREVACID) 3 mg/ml SUSP oral suspension    Sig: Take 5 mLs (15 mg total) by mouth 2 (two) times daily.    Dispense:  300 mL    Refill:  11  . sucralfate (CARAFATE) 1 GM/10ML suspension    Sig: Take 5-10 mLs (0.5-1 g total) by mouth 2 (two) times daily.    Dispense:  420 mL    Refill:  0    No Follow-up on file.   Sung Parodi MElayne Guerin M.D. [facility]  [phone]

## 2016-02-12 NOTE — Patient Instructions (Addendum)
1.  Liquid centrum multivitamin OR Flinstones.  2.  Take Prevacid  First thing every morning and at bedtime. 3. Take Carafate 30-60 minutes before lunch and dinner. 4.  Return Wednesday morning at 02/19/2016. 5.  20 cc/ml of liquid every 20 minutes throughout the day.     IF you received an x-ray today, you will receive an invoice from [facility]. Please contact [facility] at [phone] with questions or concerns regarding your invoice.   IF you received labwork today, you will receive an invoice from [employer]. Please contact [facility] at [phone] with questions or concerns regarding your invoice.   Our billing staff will not be able to assist you with questions regarding bills from these companies.  You will be contacted with the lab results as soon as they are available. The fastest way to get your results is to activate your My Chart account. Instructions are located on the last page of this paperwork. If you have not heard from [place] regarding the results in 2 weeks, please contact this office.

## 2016-02-14 ENCOUNTER — Telehealth: Payer: Self-pay | Admitting: Family Medicine

## 2016-02-14 NOTE — Telephone Encounter (Signed)
Santiago Glad at [facility] called on behalf of patient [name]. Santiago Glad stated she faxed over a written request for the office notes and statues update. Santiago Glad was to know if this is available. [phone]. Ext: [phone] Fax: [phone].

## 2016-02-19 ENCOUNTER — Ambulatory Visit (INDEPENDENT_AMBULATORY_CARE_PROVIDER_SITE_OTHER): Payer: 59 | Admitting: Family Medicine

## 2016-02-19 ENCOUNTER — Telehealth: Payer: Self-pay

## 2016-02-19 VITALS — BP 106/68 | HR 80 | Temp 97.8°F | Resp 16 | Ht 67.0 in | Wt 271.4 lb

## 2016-02-19 DIAGNOSIS — K5150 Left sided colitis without complications: Secondary | ICD-10-CM

## 2016-02-19 DIAGNOSIS — Z9889 Other specified postprocedural states: Secondary | ICD-10-CM | POA: Diagnosis not present

## 2016-02-19 DIAGNOSIS — E860 Dehydration: Secondary | ICD-10-CM

## 2016-02-19 DIAGNOSIS — Z9884 Bariatric surgery status: Secondary | ICD-10-CM | POA: Insufficient documentation

## 2016-02-19 DIAGNOSIS — Z6841 Body Mass Index (BMI) 40.0 and over, adult: Secondary | ICD-10-CM | POA: Diagnosis not present

## 2016-02-19 DIAGNOSIS — K219 Gastro-esophageal reflux disease without esophagitis: Secondary | ICD-10-CM | POA: Diagnosis not present

## 2016-02-19 DIAGNOSIS — R112 Nausea with vomiting, unspecified: Secondary | ICD-10-CM

## 2016-02-19 LAB — POCT URINALYSIS DIP (MANUAL ENTRY)
Glucose, UA: NEGATIVE
Leukocytes, UA: NEGATIVE
Nitrite, UA: NEGATIVE
PH UA: 5.5
RBC UA: NEGATIVE
SPEC GRAV UA: 1.02
UROBILINOGEN UA: 0.2

## 2016-02-19 LAB — POCT CBC
Granulocyte percent: 60.3 %G (ref 37–80)
HCT, POC: 30.5 % — AB (ref 37.7–47.9)
Hemoglobin: 10.1 g/dL — AB (ref 12.2–16.2)
LYMPH, POC: 2.9 (ref 0.6–3.4)
MCH: 23.1 pg — AB (ref 27–31.2)
MCHC: 33 g/dL (ref 31.8–35.4)
MCV: 70 fL — AB (ref 80–97)
MID (CBC): 0.6 (ref 0–0.9)
MPV: 9.9 fL (ref 0–99.8)
PLATELET COUNT, POC: 269 10*3/uL (ref 142–424)
POC Granulocyte: 5.2 (ref 2–6.9)
POC LYMPH %: 33.1 % (ref 10–50)
POC MID %: 6.6 % (ref 0–12)
RBC: 4.36 M/uL (ref 4.04–5.48)
RDW, POC: 17.8 %
WBC: 8.7 10*3/uL (ref 4.6–10.2)

## 2016-02-19 LAB — COMPREHENSIVE METABOLIC PANEL
ALT: 10 U/L (ref 6–29)
AST: 12 U/L (ref 10–30)
Albumin: 3.7 g/dL (ref 3.6–5.1)
Alkaline Phosphatase: 53 U/L (ref 33–115)
BUN: 9 mg/dL (ref 7–25)
CALCIUM: 9 mg/dL (ref 8.6–10.2)
CHLORIDE: 104 mmol/L (ref 98–110)
CO2: 22 mmol/L (ref 20–31)
CREATININE: 0.69 mg/dL (ref 0.50–1.10)
Glucose, Bld: 81 mg/dL (ref 65–99)
Potassium: 3.9 mmol/L (ref 3.5–5.3)
Sodium: 138 mmol/L (ref 135–146)
TOTAL PROTEIN: 7.2 g/dL (ref 6.1–8.1)
Total Bilirubin: 0.4 mg/dL (ref 0.2–1.2)

## 2016-02-19 MED ORDER — LANSOPRAZOLE 3 MG/ML SUSP: 15.0000 mg | Freq: Two times a day (BID) | ORAL | Status: DC

## 2016-02-19 NOTE — Progress Notes (Signed)
Subjective:    Patient ID: [name], female    DOB: [birth year], 40 y.o.   MRN: [id number]  02/19/2016  Follow-up   HPI This 40 y.o. female presents for one week follow-up for dehydration and nausea and vomiting.  Did have bowel movement last week after visit the following day; there was blood mixed in the stool; no other bowel movement since then.  No recurrent bloody stools; no diarrhea.    Got Centrum for adults; liquid MVI has not been delivered yet. Taking Protonix; insurance will not cover liquid Prevacid and too expensive; recommend sending in liquid Prevacid to [facility].  Did not get Carafate due to expense.    Drinking 20 cc apple juice or protein shake (stayed with original protein shake which has whey and soy).  Cannot drink 20cc of protein shake every 20 minutes.  Getting in 6 ounces which is 15 grams of protein per day. Also eating chicken grilled thumbnail 1-2 ounces per day.  Did eat some canteloupe one piece; bought canteloupe; eating some canteloupe with chicken.  Vomited x 6-7 since last visit.  Urinating twice per day on average.  No nocturia.  Having daily headaches.  Able to take Protonix bid.  Also drinking 20 cc of water.  Usually apple juice then water then protein shake.  CMET, Lipase, Amylase WNL last week.  Follow-up with nutritionist 03-09-2016; called surgeon's nurse; returns on 02-27-2016.  Was cleared by surgeon to return to work two days ago; written out of work until today.  Orthopedist has not cleared to return to work.  Dizziness is still persistent.  Has not tried Zofran.     Review of Systems  Constitutional: Negative for fever, chills, diaphoresis and fatigue.  Eyes: Negative for visual disturbance.  Respiratory: Negative for cough and shortness of breath.   Cardiovascular: Negative for chest pain, palpitations and leg swelling.  Gastrointestinal: Positive for nausea and vomiting. Negative for abdominal pain, diarrhea and constipation.    Endocrine: Negative for cold intolerance, heat intolerance, polydipsia, polyphagia and polyuria.  Skin: Negative for rash.  Neurological: Positive for dizziness and headaches. Negative for tremors, seizures, syncope, facial asymmetry, speech difficulty, weakness, light-headedness and numbness.    Past Medical History  Diagnosis Date  . Bronchitis   . Asthma   . Clostridium difficile infection 04/20/2012  . Obesity   . Osteoarthritis     Both knees  . Shortness of breath     on exertion  . Internal and external bleeding hemorrhoids 06/11/2014  . Left sided chronic colitis - segmental 06/11/2014  . TOBACCO USER 10/02/2009    Qualifier: Diagnosis of  By: Dimas Millin MD, Ellard Artis    . Anal fissure - posterior 10/16/2014  . IDA (iron deficiency anemia)   . Ulcerative colitis ([place])   . Motion sickness   . Postoperative nausea and vomiting 01/21/2016  . Hiatal hernia    Past Surgical History  Procedure Laterality Date  . Hemrrhoid surgery    . Colonoscopy  2007    for rectal bleeding; [facility]  . Laparoscopic tubal ligation  10/16/2011    Procedure: LAPAROSCOPIC TUBAL LIGATION;  Surgeon: Alwyn Pea, MD;  Location: [facility];  Service: Gynecology;  Laterality: Bilateral;  . Foot surgery    . Tubal ligation    . Colonoscopy N/A 06/11/2014    Procedure: COLONOSCOPY;  Surgeon: Gatha Mayer, MD;  Location: [facility];  Service: Endoscopy;  Laterality: N/A;  . Knee arthroscopy Left 09/06/2014  .  Novasure ablation  09/28/2010    mild persistent vaginal bleeding  . Dilation and curettage of uterus    . Laparoscopic gastric sleeve resection N/A 01/14/2016    Procedure: LAPAROSCOPIC GASTRIC SLEEVE RESECTION;  Surgeon: Excell Seltzer, MD;  Location: [facility];  Service: General;  Laterality: N/A;   Allergies  Allergen Reactions  . Oxycodone Anaphylaxis  . Ciprofloxacin Itching and Rash  . Doxycycline Nausea And Vomiting  . Morphine And Related Swelling and Other (See Comments)    Pt reported  throat swelling to morphine.  Has tolerated Norco and Dilaudid.  Marland Kitchen Penicillins Rash and Other (See Comments)    .Marland KitchenHas patient had a PCN reaction causing immediate rash, facial/tongue/throat swelling, SOB or lightheadedness with hypotension: Yes Has patient had a PCN reaction causing severe rash involving mucus membranes or skin necrosis: No Has patient had a PCN reaction that required hospitalization UNKNOWN - childhood allergy  Has patient had a PCN reaction occurring within the last 10 years: No If all of the above answers are "NO", then may proceed with Cephalosporin use.    Current Outpatient Prescriptions  Medication Sig Dispense Refill  . Multiple Vitamins-Minerals (MULTIVITAMIN & MINERAL PO) Take 1 tablet by mouth 2 (two) times daily. Reported on 02/12/2016    . albuterol (PROVENTIL HFA;VENTOLIN HFA) 108 (90 Base) MCG/ACT inhaler Inhale 2 puffs into the lungs every 6 (six) hours as needed for wheezing or shortness of breath. For shortness of breath (Patient taking differently: Inhale 2 puffs into the lungs every 6 (six) hours as needed for wheezing or shortness of breath. ) 18 g 2  . CALCIUM CITRATE PO Take 500 tablets by mouth 2 (two) times daily.     . cyanocobalamin (,VITAMIN B-12,) 1000 MCG/ML injection Inject 1,000 mcg into the muscle every 30 (thirty) days.    . Cyanocobalamin (VITAMIN B-12) 1000 MCG SUBL Place 1,000 mcg under the tongue daily.    . diclofenac sodium (VOLTAREN) 1 % GEL Apply 1 application topically 4 (four) times daily as needed (For knee pain.).    Marland Kitchen EPINEPHrine 0.3 mg/0.3 mL IJ SOAJ injection Inject 0.3 mg into the muscle once as needed (For anaphylaxis.).     Marland Kitchen fluticasone (FLONASE) 50 MCG/ACT nasal spray Place 1 spray into both nostrils daily as needed for allergies. Reported on 02/19/2016    . HYDROmorphone HCl (DILAUDID) 1 MG/ML LIQD Take 1 mg by mouth every 6 (six) hours as needed for severe pain. Reported on 02/19/2016    . ondansetron (ZOFRAN-ODT) 4 MG  disintegrating tablet Take 4 mg by mouth every 8 (eight) hours as needed for nausea or vomiting.     . pantoprazole (PROTONIX) 40 MG tablet Take 40 mg by mouth 2 (two) times daily.      No current facility-administered medications for this visit.   Social History   Social History  . Marital Status: Single    Spouse Name: N/A  . Number of Children: N/A  . Years of Education: N/A   Occupational History  . Not on file.   Social History Main Topics  . Smoking status: Former Smoker -- 0.50 packs/day for 20 years    Types: Cigarettes    Quit date: 03/17/2014  . Smokeless tobacco: Never Used  . Alcohol Use: No  . Drug Use: No  . Sexual Activity: Yes    Birth Control/ Protection: Condom   Other Topics Concern  . Not on file   Social History Narrative   Marital status:  Single; not  dating seriously.      Children:  3 sons (1, 60, 65), 1 daughter (deceased); no grandchildren      Lives:  With 2 sons      Employment:  Full time; Corporate treasurer at [employer]      Tobacco: quit June 2015      Alcohol:  None      Drugs:  None      Exercise:  Leg lifts for knee strengthening; walking   1 caffeine beverage daily      Sexual activity:  Active; total partners = up there.  No STDs in past.        Family History  Problem Relation Age of Onset  . Hypertension Mother   . Diabetes Mother   . Sarcoidosis Mother     lungs and skin  . Asthma Mother   . Hypertension Father   . Diabetes Father   . Asthma Son   . Asthma Sister   . Cancer Sister     possible pancreatic cancer  . Adrenal disorder Sister     Tumor   . Asthma Brother   . Asthma Daughter     died age 39.5  . Cancer Daughter 4    brain; died age 39.5  . Asthma Son        Objective:    BP 106/68 mmHg  Pulse 80  Temp(Src) 97.8 F (36.6 C) (Oral)  Resp 16  Ht 5' 7"  (1.702 m)  Wt 271 lb 6.4 oz (123.106 kg)  BMI 42.50 kg/m2  SpO2 100% Physical Exam  Constitutional: She is oriented to person, place, and time.  She appears well-developed and well-nourished. No distress.  HENT:  Head: Normocephalic and atraumatic.  Right Ear: External ear normal.  Left Ear: External ear normal.  Nose: Nose normal.  Mouth/Throat: Oropharynx is clear and moist.  Eyes: Conjunctivae and EOM are normal. Pupils are equal, round, and reactive to light.  Neck: Normal range of motion. Neck supple. Carotid bruit is not present. No thyromegaly present.  Cardiovascular: Normal rate, regular rhythm, normal heart sounds and intact distal pulses.  Exam reveals no gallop and no friction rub.   No murmur heard. Pulmonary/Chest: Effort normal and breath sounds normal. She has no wheezes. She has no rales.  Abdominal: Soft. Bowel sounds are normal. She exhibits no distension and no mass. There is no tenderness. There is no rebound and no guarding.  Lymphadenopathy:    She has no cervical adenopathy.  Neurological: She is alert and oriented to person, place, and time. No cranial nerve deficit.  Skin: Skin is warm and dry. No rash noted. She is not diaphoretic. No erythema. No pallor.  Psychiatric: She has a normal mood and affect. Her behavior is normal.         Assessment & Plan:   1. Dehydration   2. Non-intractable vomiting with nausea, vomiting of unspecified type   3. Gastroesophageal reflux disease without esophagitis   4. Postoperative nausea and vomiting   5. Morbid obesity with BMI of 45.0-49.9, adult ([place])   6. Left sided colitis, without complications ([place])   7. S/P laparoscopic sleeve gastrectomy     Orders Placed This Encounter  Procedures  . Comprehensive metabolic panel  . Orthostatic vital signs  . POCT urinalysis dipstick  . POCT CBC  . Insert peripheral IV   Meds ordered this encounter  Medications  . DISCONTD: lansoprazole (PREVACID) 3 mg/ml SUSP oral suspension    Sig: Take  5 mLs (15 mg total) by mouth 2 (two) times daily.    Dispense:  300 mL    Refill:  11    No Follow-up on  file.    Daymion Nazaire Elayne Guerin, M.D. [facility] [address] [place], [place]  [address] [phone] phone [phone] fax

## 2016-02-19 NOTE — Patient Instructions (Addendum)
1. Follow up with general surgery on February 27, 2016.     IF you received an x-ray today, you will receive an invoice from [facility]. Please contact [facility] at [phone] with questions or concerns regarding your invoice.   IF you received labwork today, you will receive an invoice from [employer]. Please contact [facility] at [phone] with questions or concerns regarding your invoice.   Our billing staff will not be able to assist you with questions regarding bills from these companies.  You will be contacted with the lab results as soon as they are available. The fastest way to get your results is to activate your My Chart account. Instructions are located on the last page of this paperwork. If you have not heard from [place] regarding the results in 2 weeks, please contact this office.

## 2016-02-28 ENCOUNTER — Other Ambulatory Visit: Payer: Self-pay | Admitting: General Surgery

## 2016-02-28 ENCOUNTER — Ambulatory Visit
Admission: RE | Admit: 2016-02-28 | Discharge: 2016-02-28 | Disposition: A | Discharge status: Home / Self Care | Payer: 59 | Source: Ambulatory Visit | Attending: General Surgery | Admitting: General Surgery

## 2016-02-28 DIAGNOSIS — R112 Nausea with vomiting, unspecified: Secondary | ICD-10-CM

## 2016-03-06 ENCOUNTER — Other Ambulatory Visit: Payer: Self-pay | Admitting: General Surgery

## 2016-03-11 ENCOUNTER — Encounter: Payer: Self-pay | Admitting: Dietician

## 2016-03-11 ENCOUNTER — Encounter: Payer: 59 | Attending: General Surgery | Admitting: Dietician

## 2016-03-11 DIAGNOSIS — M170 Bilateral primary osteoarthritis of knee: Secondary | ICD-10-CM | POA: Insufficient documentation

## 2016-03-11 DIAGNOSIS — Z6841 Body Mass Index (BMI) 40.0 and over, adult: Secondary | ICD-10-CM | POA: Insufficient documentation

## 2016-03-11 NOTE — Progress Notes (Signed)
  Follow-up visit:  8 Weeks Post-Operative Sleeve gastrectomy Surgery  Medical Nutrition Therapy:  Appt start time: 0945 end time:  1020.  Primary concerns today: Post-operative Bariatric Surgery Nutrition Management. Franci returns today having lost a total of 42.6 lbs. She reports that she is unable to tolerate any solid foods and is still only taking in liquids. Having foaming and "sliming" when she eats anything. She states that when she eats she is chewing foods to "paste" consistency. She reports that it "takes an hour to eat a piece of chicken the size of her fingernail" and it causes vomiting. Saw Dr. Excell Seltzer (bariatric surgeon) on June 1. She state she had some "imaging" done and reports that swelling was noted. Quyen reports that is takes her about 15 minutes to take in 1 oz of protein shake; only taking in about 24 oz pf liquids throughout the day (at least 6 oz protein shake, decaf coffee with unflavored protein powder, water). Struggling with nausea. Takes protonix daily, zofran before eating but patient states that this does not help. Feels like her overall intake has "gotten worse." She has not been able to gradually increase her intake. Having pain in her ribs and in the middle of her back on the right side. She feels like her rib pain is due to frequent vomiting. Reports watery, yellow stools that burn.   Surgery date: 01/14/2016 Surgery type: sleeve gastrectomy Start weight at [facility]: 301 lbs on 11/18/2015, 302 lbs on 01/06/16 Weight today: 259.4 lbs Weight change: 17.5 lbs Total weight lost: 42.6 lbs  TANITA  BODY COMP RESULTS  01/06/16 01/28/16 03/11/16   BMI (kg/m^2) 47.3 43.4 40.6   Fat Mass (lbs) 171 154.5 137.8   Fat Free Mass (lbs) 131 122.5 121.6   Total Body Water (lbs) 96 89.5 90    Preferred Learning Style:   No preference indicated   Learning Readiness:   Ready  Fluid intake: 24 oz  Estimated total protein intake: 12g  Medications: see  list Supplementation: has difficulty taking due to nausea and vomiting (takes B12 shots)   Using straws: no Drinking while eating: n/a Hair loss: thinning Carbonated beverages: none N/V/D/C: diarrhea and vomiting Dumping syndrome: none  Recent physical activity:  Did not assess today  Progress Towards Goal(s):  In progress.  Handouts given during visit include:  none   Nutritional Diagnosis:  Lillie-3.3 Overweight/obesity related to past poor dietary habits and physical inactivity as evidenced by patient w/ recent sleeve gastrectomy surgery following dietary guidelines for continued weight loss.     Intervention:  Nutrition counseling provided. Encouraged patient to continue sipping fluids throughout the day as tolerated and follow up with surgeon.   Teaching Method Utilized:  Visual Auditory Hands on  Barriers to learning/adherence to lifestyle change: inability to tolerate adequate fluid and protein  Demonstrated degree of understanding via:  Teach Back   Monitoring/Evaluation:  Dietary intake, exercise, and body weight. Follow up in 3 weeks.

## 2016-03-11 NOTE — Patient Instructions (Signed)
-  Continue to sip protein shake throughout the day as tolerated -Call [facility] this week for a follow up appointment  Surgery date: 01/14/2016 Surgery type: sleeve gastrectomy Start weight at [facility]: 301 lbs on 11/18/2015, 302 lbs on 01/06/16 Weight today: 259.4 lbs Weight change: 17.5 lbs Total weight lost: 42.6 lbs  TANITA  BODY COMP RESULTS  01/06/16 01/28/16 03/11/16   BMI (kg/m^2) 47.3 43.4 40.6   Fat Mass (lbs) 171 154.5 137.8   Fat Free Mass (lbs) 131 122.5 121.6   Total Body Water (lbs) 96 89.5 90

## 2016-03-13 ENCOUNTER — Other Ambulatory Visit: Payer: Self-pay | Admitting: General Surgery

## 2016-03-13 DIAGNOSIS — Z9884 Bariatric surgery status: Secondary | ICD-10-CM

## 2016-03-16 ENCOUNTER — Ambulatory Visit
Admission: RE | Admit: 2016-03-16 | Discharge: 2016-03-16 | Disposition: A | Discharge status: Home / Self Care | Payer: 59 | Source: Ambulatory Visit | Attending: General Surgery | Admitting: General Surgery

## 2016-03-16 DIAGNOSIS — Z9884 Bariatric surgery status: Secondary | ICD-10-CM

## 2016-03-19 ENCOUNTER — Other Ambulatory Visit: Payer: 59

## 2016-03-24 ENCOUNTER — Emergency Department (HOSPITAL_COMMUNITY)
Admission: EM | Admit: 2016-03-24 | Discharge: 2016-03-24 | Disposition: A | Discharge status: Home / Self Care | Payer: 59 | Attending: Emergency Medicine | Admitting: Emergency Medicine

## 2016-03-24 ENCOUNTER — Other Ambulatory Visit: Payer: Self-pay | Admitting: Surgery

## 2016-03-24 ENCOUNTER — Emergency Department (HOSPITAL_COMMUNITY): Payer: 59

## 2016-03-24 ENCOUNTER — Encounter (HOSPITAL_COMMUNITY): Payer: Self-pay | Admitting: Emergency Medicine

## 2016-03-24 ENCOUNTER — Other Ambulatory Visit: Payer: Self-pay

## 2016-03-24 DIAGNOSIS — Z87891 Personal history of nicotine dependence: Secondary | ICD-10-CM | POA: Diagnosis not present

## 2016-03-24 DIAGNOSIS — Y929 Unspecified place or not applicable: Secondary | ICD-10-CM | POA: Diagnosis not present

## 2016-03-24 DIAGNOSIS — J45909 Unspecified asthma, uncomplicated: Secondary | ICD-10-CM | POA: Diagnosis not present

## 2016-03-24 DIAGNOSIS — W182XXA Fall in (into) shower or empty bathtub, initial encounter: Secondary | ICD-10-CM | POA: Insufficient documentation

## 2016-03-24 DIAGNOSIS — Y999 Unspecified external cause status: Secondary | ICD-10-CM | POA: Insufficient documentation

## 2016-03-24 DIAGNOSIS — Z7951 Long term (current) use of inhaled steroids: Secondary | ICD-10-CM | POA: Insufficient documentation

## 2016-03-24 DIAGNOSIS — Z6838 Body mass index (BMI) 38.0-38.9, adult: Secondary | ICD-10-CM | POA: Diagnosis not present

## 2016-03-24 DIAGNOSIS — E669 Obesity, unspecified: Secondary | ICD-10-CM | POA: Diagnosis not present

## 2016-03-24 DIAGNOSIS — S0093XA Contusion of unspecified part of head, initial encounter: Secondary | ICD-10-CM

## 2016-03-24 DIAGNOSIS — Z79899 Other long term (current) drug therapy: Secondary | ICD-10-CM | POA: Insufficient documentation

## 2016-03-24 DIAGNOSIS — Y93E1 Activity, personal bathing and showering: Secondary | ICD-10-CM | POA: Insufficient documentation

## 2016-03-24 DIAGNOSIS — R55 Syncope and collapse: Secondary | ICD-10-CM

## 2016-03-24 DIAGNOSIS — S0990XA Unspecified injury of head, initial encounter: Secondary | ICD-10-CM | POA: Diagnosis present

## 2016-03-24 DIAGNOSIS — M1990 Unspecified osteoarthritis, unspecified site: Secondary | ICD-10-CM | POA: Insufficient documentation

## 2016-03-24 HISTORY — DX: Diaphragmatic hernia without obstruction or gangrene: K44.9

## 2016-03-24 LAB — URINE MICROSCOPIC-ADD ON

## 2016-03-24 LAB — COMPREHENSIVE METABOLIC PANEL
ALK PHOS: 59 U/L (ref 38–126)
ALT: 11 U/L — ABNORMAL LOW (ref 14–54)
ANION GAP: 11 (ref 5–15)
AST: 15 U/L (ref 15–41)
Albumin: 4.3 g/dL (ref 3.5–5.0)
BILIRUBIN TOTAL: 0.5 mg/dL (ref 0.3–1.2)
BUN: 12 mg/dL (ref 6–20)
CHLORIDE: 106 mmol/L (ref 101–111)
CO2: 22 mmol/L (ref 22–32)
Calcium: 9.7 mg/dL (ref 8.9–10.3)
Creatinine, Ser: 0.81 mg/dL (ref 0.44–1.00)
GFR calc Af Amer: >60 mL/min (ref >60)
GLUCOSE: 88 mg/dL (ref 65–99)
POTASSIUM: 3.1 mmol/L — AB (ref 3.5–5.1)
Sodium: 139 mmol/L (ref 135–145)
Total Protein: 8.7 g/dL — ABNORMAL HIGH (ref 6.5–8.1)

## 2016-03-24 LAB — URINALYSIS, ROUTINE W REFLEX MICROSCOPIC
GLUCOSE, UA: NEGATIVE mg/dL
HGB URINE DIPSTICK: NEGATIVE
Leukocytes, UA: NEGATIVE
Nitrite: NEGATIVE
PH: 6 (ref 5.0–8.0)
PROTEIN: 30 mg/dL — AB
Specific Gravity, Urine: 1.031 — ABNORMAL HIGH (ref 1.005–1.030)

## 2016-03-24 LAB — CBC
HCT: 35.1 % — ABNORMAL LOW (ref 36.0–46.0)
Hemoglobin: 11 g/dL — ABNORMAL LOW (ref 12.0–15.0)
MCH: 22.4 pg — AB (ref 26.0–34.0)
MCHC: 31.3 g/dL (ref 30.0–36.0)
MCV: 71.5 fL — AB (ref 78.0–100.0)
PLATELETS: 334 10*3/uL (ref 150–400)
RBC: 4.91 MIL/uL (ref 3.87–5.11)
RDW: 19 % — AB (ref 11.5–15.5)
WBC: 9.1 10*3/uL (ref 4.0–10.5)

## 2016-03-24 LAB — POC URINE PREG, ED: Preg Test, Ur: NEGATIVE

## 2016-03-24 LAB — I-STAT TROPONIN, ED: Troponin i, poc: 0 ng/mL (ref 0.00–0.08)

## 2016-03-24 LAB — CBG MONITORING, ED: Glucose-Capillary: 81 mg/dL (ref 65–99)

## 2016-03-24 MED ORDER — POTASSIUM CHLORIDE 10 MEQ/100ML IV SOLN
10.0000 meq | INTRAVENOUS | Status: AC
  Administered 2016-03-24 (×2): 10 meq via INTRAVENOUS
  Filled 2016-03-24 (×2): qty 100

## 2016-03-24 MED ORDER — HYDROCODONE-ACETAMINOPHEN 7.5-325 MG/15ML PO SOLN
10.0000 mL | Freq: Once | ORAL | Status: AC
  Administered 2016-03-24: 10 mL via ORAL
  Filled 2016-03-24: qty 15

## 2016-03-24 MED ORDER — METOCLOPRAMIDE HCL 5 MG/ML IJ SOLN
10.0000 mg | Freq: Once | INTRAMUSCULAR | Status: AC
  Administered 2016-03-24: 10 mg via INTRAVENOUS
  Filled 2016-03-24: qty 2

## 2016-03-24 MED ORDER — SODIUM CHLORIDE 0.9 % IV BOLUS (SEPSIS)
1000.0000 mL | Freq: Once | INTRAVENOUS | Status: AC
  Administered 2016-03-24: 1000 mL via INTRAVENOUS

## 2016-03-24 MED ORDER — LORAZEPAM 2 MG/ML IJ SOLN
0.5000 mg | Freq: Once | INTRAMUSCULAR | Status: AC
  Administered 2016-03-24: 0.5 mg via INTRAVENOUS
  Filled 2016-03-24: qty 1

## 2016-03-24 NOTE — Discharge Instructions (Signed)
Near-Syncope Near-syncope (commonly known as near fainting) is sudden weakness, dizziness, or feeling like you might pass out. During an episode of near-syncope, you may also develop pale skin, have tunnel vision, or feel sick to your stomach (nauseous). Near-syncope may occur when getting up after sitting or while standing for a long time. It is caused by a sudden decrease in blood flow to the brain. This decrease can result from various causes or triggers, most of which are not serious. However, because near-syncope can sometimes be a sign of something serious, a medical evaluation is required. The specific cause is often not determined. HOME CARE INSTRUCTIONS  Monitor your condition for any changes. The following actions may help to alleviate any discomfort you are experiencing:  Have someone stay with you until you feel stable.  Lie down right away and prop your feet up if you start feeling like you might faint. Breathe deeply and steadily. Wait until all the symptoms have passed. Most of these episodes last only a few minutes. You may feel tired for several hours.   Drink enough fluids to keep your urine clear or pale yellow.   If you are taking blood pressure or heart medicine, get up slowly when seated or lying down. Take several minutes to sit and then stand. This can reduce dizziness.  Follow up with your health care provider as directed. SEEK IMMEDIATE MEDICAL CARE IF:   You have a severe headache.   You have unusual pain in the chest, abdomen, or back.   You are bleeding from the mouth or rectum, or you have black or tarry stool.   You have an irregular or very fast heartbeat.   You have repeated fainting or have seizure-like jerking during an episode.   You faint when sitting or lying down.   You have confusion.   You have difficulty walking.   You have severe weakness.   You have vision problems.  MAKE SURE YOU:   Understand these instructions.  Will  watch your condition.  Will get help right away if you are not doing well or get worse.   This information is not intended to replace advice given to you by your health care provider. Make sure you discuss any questions you have with your health care provider.   Document Released: 09/14/2005 Document Revised: 09/19/2013 Document Reviewed: 02/17/2013 Elsevier Interactive Patient Education 2016 Elsevier Inc. Dehydration, Adult Dehydration is a condition in which you do not have enough fluid or water in your body. It happens when you take in less fluid than you lose. Vital organs such as the kidneys, brain, and heart cannot function without a proper amount of fluids. Any loss of fluids from the body can cause dehydration.  Dehydration can range from mild to severe. This condition should be treated right away to help prevent it from becoming severe. CAUSES  This condition may be caused by:  Vomiting.  Diarrhea.  Excessive sweating, such as when exercising in hot or humid weather.  Not drinking enough fluid during strenuous exercise or during an illness.  Excessive urine output.  Fever.  Certain medicines. RISK FACTORS This condition is more likely to develop in:  People who are taking certain medicines that cause the body to lose excess fluid (diuretics).   People who have a chronic illness, such as diabetes, that may increase urination.  Older adults.   People who live at high altitudes.   People who participate in endurance sports.  SYMPTOMS  Mild Dehydration  Thirst.  Dry lips.  Slightly dry mouth.  Dry, warm skin. Moderate Dehydration  Very dry mouth.   Muscle cramps.   Dark urine and decreased urine production.   Decreased tear production.   Headache.   Light-headedness, especially when you stand up from a sitting position.  Severe Dehydration  Changes in skin.   Cold and clammy skin.   Skin does not spring back quickly when lightly  pinched and released.   Changes in body fluids.   Extreme thirst.   No tears.   Not able to sweat when body temperature is high, such as in hot weather.   Minimal urine production.   Changes in vital signs.   Rapid, weak pulse (more than 100 beats per minute when you are sitting still).   Rapid breathing.   Low blood pressure.   Other changes.   Sunken eyes.   Cold hands and feet.   Confusion.  Lethargy and difficulty being awakened.  Fainting (syncope).   Short-term weight loss.   Unconsciousness. DIAGNOSIS  This condition may be diagnosed based on your symptoms. You may also have tests to determine how severe your dehydration is. These tests may include:   Urine tests.   Blood tests.  TREATMENT  Treatment for this condition depends on the severity. Mild or moderate dehydration can often be treated at home. Treatment should be started right away. Do not wait until dehydration becomes severe. Severe dehydration needs to be treated at the hospital. Treatment for Mild Dehydration  Drinking plenty of water to replace the fluid you have lost.   Replacing minerals in your blood (electrolytes) that you may have lost.  Treatment for Moderate Dehydration  Consuming oral rehydration solution (ORS). Treatment for Severe Dehydration  Receiving fluid through an IV tube.   Receiving electrolyte solution through a feeding tube that is passed through your nose and into your stomach (nasogastric tube or NG tube).  Correcting any abnormalities in electrolytes. HOME CARE INSTRUCTIONS   Drink enough fluid to keep your urine clear or pale yellow.   Drink water or fluid slowly by taking small sips. You can also try sucking on ice cubes.  Have food or beverages that contain electrolytes. Examples include bananas and sports drinks.  Take over-the-counter and prescription medicines only as told by your health care provider.   Prepare ORS according  to the manufacturer's instructions. Take sips of ORS every 5 minutes until your urine returns to normal.  If you have vomiting or diarrhea, continue to try to drink water, ORS, or both.   If you have diarrhea, avoid:   Beverages that contain caffeine.   Fruit juice.   Milk.   Carbonated soft drinks.  Do not take salt tablets. This can lead to the condition of having too much sodium in your body (hypernatremia).  SEEK MEDICAL CARE IF:  You cannot eat or drink without vomiting.  You have had moderate diarrhea during a period of more than 24 hours.  You have a fever. SEEK IMMEDIATE MEDICAL CARE IF:   You have extreme thirst.  You have severe diarrhea.  You have not urinated in 6-8 hours, or you have urinated only a small amount of very dark urine.  You have shriveled skin.  You are dizzy, confused, or both.   This information is not intended to replace advice given to you by your health care provider. Make sure you discuss any questions you have with your health care provider.   Document Released: 09/14/2005  Document Revised: 06/05/2015 Document Reviewed: 01/30/2015 Elsevier Interactive Patient Education [employer].

## 2016-03-24 NOTE — ED Notes (Signed)
Pt states she still feels dizzy and has nausea but feels comfortable going home.

## 2016-03-24 NOTE — ED Notes (Signed)
Still need to collect urine from patient.  Clicked off in error

## 2016-03-24 NOTE — ED Provider Notes (Signed)
CSN: [id number]     Arrival date & time 03/24/16  0941 History   First MD Initiated Contact with Patient 03/24/16 1014     Chief Complaint  Patient presents with  . Fall  . Near Syncope  . Dizziness     Patient is a 40 y.o. female presenting with fall, near-syncope, and dizziness. The history is provided by the patient. No language interpreter was used.  Fall  Near Syncope  Dizziness  [name] is a 40 y.o. female who presents to the Emergency Department complaining of Syncope. She has a history of gastric sleeve surgery in April 2017. Since that time she has had problems with recurrent nausea, vomiting and decreased oral intake. Today she was taking a shower and became lightheaded and fell. She is unsure if she passed out or not. She has soreness to her head and posterior neck. She does not recall hitting her head but she does not recall all events. She's had intermittent chest pain for the last month, none currently. She also endorses decreased urinary output. She reports persistent dizziness, much worse with standing.  Past Medical History  Diagnosis Date  . Bronchitis   . Asthma   . Clostridium difficile infection 04/20/2012  . Obesity   . Osteoarthritis     Both knees  . Shortness of breath     on exertion  . Internal and external bleeding hemorrhoids 06/11/2014  . Left sided chronic colitis - segmental 06/11/2014  . TOBACCO USER 10/02/2009    Qualifier: Diagnosis of  By: Dimas Millin MD, Ellard Artis    . Anal fissure - posterior 10/16/2014  . IDA (iron deficiency anemia)   . Ulcerative colitis ([place])   . Motion sickness   . Postoperative nausea and vomiting 01/21/2016  . Hiatal hernia    Past Surgical History  Procedure Laterality Date  . Hemrrhoid surgery    . Colonoscopy  2007    for rectal bleeding; [facility]  . Laparoscopic tubal ligation  10/16/2011    Procedure: LAPAROSCOPIC TUBAL LIGATION;  Surgeon: Alwyn Pea, MD;  Location: [facility];  Service: Gynecology;  Laterality:  Bilateral;  . Foot surgery    . Tubal ligation    . Colonoscopy N/A 06/11/2014    Procedure: COLONOSCOPY;  Surgeon: Gatha Mayer, MD;  Location: [facility];  Service: Endoscopy;  Laterality: N/A;  . Knee arthroscopy Left 09/06/2014  . Novasure ablation  09/28/2010    mild persistent vaginal bleeding  . Dilation and curettage of uterus    . Laparoscopic gastric sleeve resection N/A 01/14/2016    Procedure: LAPAROSCOPIC GASTRIC SLEEVE RESECTION;  Surgeon: Excell Seltzer, MD;  Location: [facility];  Service: General;  Laterality: N/A;   Family History  Problem Relation Age of Onset  . Hypertension Mother   . Diabetes Mother   . Sarcoidosis Mother     lungs and skin  . Asthma Mother   . Hypertension Father   . Diabetes Father   . Asthma Son   . Asthma Sister   . Cancer Sister     possible pancreatic cancer  . Adrenal disorder Sister     Tumor   . Asthma Brother   . Asthma Daughter     died age 56.5  . Cancer Daughter 4    brain; died age 56.5  . Asthma Son    Social History  Substance Use Topics  . Smoking status: Former Smoker -- 0.50 packs/day for 20 years    Types: Cigarettes  Quit date: 03/17/2014  . Smokeless tobacco: Never Used  . Alcohol Use: No   OB History    No data available     Review of Systems  Cardiovascular: Positive for near-syncope.  Neurological: Positive for dizziness.  All other systems reviewed and are negative.     Allergies  Oxycodone; Ciprofloxacin; Doxycycline; Morphine and related; and Penicillins  Home Medications   Prior to Admission medications   Medication Sig Start Date End Date Taking? Authorizing Provider  albuterol (PROVENTIL HFA;VENTOLIN HFA) 108 (90 Base) MCG/ACT inhaler Inhale 2 puffs into the lungs every 6 (six) hours as needed for wheezing or shortness of breath. For shortness of breath Patient taking differently: Inhale 2 puffs into the lungs every 6 (six) hours as needed for wheezing or shortness of breath.  09/30/15   Yes Wardell Honour, MD  CALCIUM CITRATE PO Take 500 tablets by mouth 2 (two) times daily.    Yes Historical Provider, MD  cyanocobalamin (,VITAMIN B-12,) 1000 MCG/ML injection Inject 1,000 mcg into the muscle every 30 (thirty) days.   Yes Historical Provider, MD  Cyanocobalamin (VITAMIN B-12) 1000 MCG SUBL Place 1,000 mcg under the tongue daily.   Yes Historical Provider, MD  diclofenac sodium (VOLTAREN) 1 % GEL Apply 1 application topically 4 (four) times daily as needed (For knee pain.).   Yes Historical Provider, MD  EPINEPHrine 0.3 mg/0.3 mL IJ SOAJ injection Inject 0.3 mg into the muscle once as needed (For anaphylaxis.).    Yes Historical Provider, MD  fluticasone (FLONASE) 50 MCG/ACT nasal spray Place 1 spray into both nostrils daily as needed for allergies. Reported on 02/19/2016   Yes Historical Provider, MD  HYDROmorphone HCl (DILAUDID) 1 MG/ML LIQD Take 1 mg by mouth every 6 (six) hours as needed for severe pain. Reported on 02/19/2016 01/17/16  Yes Historical Provider, MD  Multiple Vitamins-Minerals (MULTIVITAMIN & MINERAL PO) Take 1 tablet by mouth 2 (two) times daily. Reported on 02/12/2016   Yes Historical Provider, MD  ondansetron (ZOFRAN-ODT) 4 MG disintegrating tablet Take 4 mg by mouth every 8 (eight) hours as needed for nausea or vomiting.  01/09/16  Yes Historical Provider, MD  pantoprazole (PROTONIX) 40 MG tablet Take 40 mg by mouth 2 (two) times daily.  01/09/16  Yes Historical Provider, MD   BP 123/75 mmHg  Pulse 54  Temp(Src) 98.1 F (36.7 C) (Oral)  Resp 18  Ht 5' 7"  (1.702 m)  Wt 244 lb (110.678 kg)  BMI 38.21 kg/m2  SpO2 100% Physical Exam  Constitutional: She is oriented to person, place, and time. She appears well-developed and well-nourished.  HENT:  Head: Normocephalic and atraumatic.  Cardiovascular: Normal rate and regular rhythm.   No murmur heard. Pulmonary/Chest: Effort normal and breath sounds normal. No respiratory distress.  Abdominal: Soft. There is no  tenderness. There is no rebound and no guarding.  Musculoskeletal: She exhibits no edema or tenderness.  Neurological: She is alert and oriented to person, place, and time.  Skin: Skin is warm and dry.  Psychiatric:  Anxious and tearful  Nursing note and vitals reviewed.   ED Course  Procedures (including critical care time) Labs Review Labs Reviewed  CBC - Abnormal; Notable for the following:    Hemoglobin 11.0 (*)    HCT 35.1 (*)    MCV 71.5 (*)    MCH 22.4 (*)    RDW 19.0 (*)    All other components within normal limits  URINALYSIS, ROUTINE W REFLEX MICROSCOPIC (NOT AT [facility]) -  Abnormal; Notable for the following:    Color, Urine AMBER (*)    APPearance TURBID (*)    Specific Gravity, Urine 1.031 (*)    Bilirubin Urine MODERATE (*)    Ketones, ur >80 (*)    Protein, ur 30 (*)    All other components within normal limits  COMPREHENSIVE METABOLIC PANEL - Abnormal; Notable for the following:    Potassium 3.1 (*)    Total Protein 8.7 (*)    ALT 11 (*)    All other components within normal limits  URINE MICROSCOPIC-ADD ON - Abnormal; Notable for the following:    Squamous Epithelial / LPF 6-30 (*)    Bacteria, UA MANY (*)    All other components within normal limits  URINE CULTURE  CBG MONITORING, ED  POC URINE PREG, ED  I-STAT TROPOININ, ED    Imaging Review Ct Head Wo Contrast  03/24/2016  CLINICAL DATA:  Dizziness and nausea following fall earlier today ; neck pain EXAM: CT HEAD WITHOUT CONTRAST CT CERVICAL SPINE WITHOUT CONTRAST TECHNIQUE: Multidetector CT imaging of the head and cervical spine was performed following the standard protocol without intravenous contrast. Multiplanar CT image reconstructions of the cervical spine were also generated. COMPARISON:  Head CT March 12, 2015 FINDINGS: CT HEAD FINDINGS The ventricles are normal in size and configuration. There is mild invagination of CSF into the sella. There is no intracranial mass, hemorrhage, extra-axial fluid  collection, or midline shift. Gray-white compartments are normal. No acute infarct evident. No vascular lesions are evident. The bony calvarium appears intact. 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 Mastoid air cells are clear. Visualized orbits appear symmetric bilaterally. Visualized paranasal sinuses are clear. CT CERVICAL SPINE FINDINGS There is no fracture or spondylolisthesis. Prevertebral soft tissues and predental space regions are normal. The disc spaces appear normal. There is a small focus of calcification in the anterior ligament at C5-6. There is no nerve root edema or effacement. No disc extrusion or stenosis. IMPRESSION: CT head: There is mild invagination of CSF into the sella. Ventricles are normal in size and configuration. No intracranial mass, hemorrhage, or extra-axial fluid collection. Gray-white compartments appear normal. CT cervical spine: Small focus of calcification in the anterior ligament at C5-6. No appreciable disc space narrowing. No disc extrusion or stenosis. No nerve root edema or effacement. No fracture or spondylolisthesis. Electronically Signed   By: [name] M.D.   On: 03/24/2016 11:53   Ct Cervical Spine Wo Contrast  03/24/2016  CLINICAL DATA:  Dizziness and nausea following fall earlier today ; neck pain EXAM: CT HEAD WITHOUT CONTRAST CT CERVICAL SPINE WITHOUT CONTRAST TECHNIQUE: Multidetector CT imaging of the head and cervical spine was performed following the standard protocol without intravenous contrast. Multiplanar CT image reconstructions of the cervical spine were also generated. COMPARISON:  Head CT March 12, 2015 FINDINGS: CT HEAD FINDINGS The ventricles are normal in size and configuration. There is mild invagination of CSF into the sella. There is no intracranial mass, hemorrhage, extra-axial fluid collection, or midline shift. Gray-white compartments are normal. No acute infarct evident. No vascular lesions are evident. The bony  calvarium appears intact. 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 Mastoid air cells are clear. Visualized orbits appear symmetric bilaterally. Visualized paranasal sinuses are clear. CT CERVICAL SPINE FINDINGS There is no fracture or spondylolisthesis. Prevertebral soft tissues and predental space regions are normal. The disc spaces appear normal. There is a small focus of  calcification in the anterior ligament at C5-6. There is no nerve root edema or effacement. No disc extrusion or stenosis. IMPRESSION: CT head: There is mild invagination of CSF into the sella. Ventricles are normal in size and configuration. No intracranial mass, hemorrhage, or extra-axial fluid collection. Gray-white compartments appear normal. CT cervical spine: Small focus of calcification in the anterior ligament at C5-6. No appreciable disc space narrowing. No disc extrusion or stenosis. No nerve root edema or effacement. No fracture or spondylolisthesis. Electronically Signed   By: [name] M.D.   On: 03/24/2016 11:53   I have personally reviewed and evaluated these images and lab results as part of my medical decision-making.   EKG Interpretation   Date/Time:  Tuesday March 24 2016 10:11:52 EDT Ventricular Rate:  71 PR Interval:    QRS Duration: 86 QT Interval:  371 QTC Calculation: 404 R Axis:   32 Text Interpretation:  Sinus rhythm Abnormal R-wave progression, early  transition Confirmed by [name] [phone]) on 03/24/2016 10:44:27 AM      MDM   Final diagnoses:  Head contusion, initial encounter  Near syncope    Patient here for evaluation of near syncope/syncope while in the shower today. She has a history of gastric sleeve surgery in April and since that time and is having rapid weight loss and decreased oral intake. No evidence of acute intracranial abnormality on imaging or C-spine injury. Electrolytes with mild hypokalemia. She received 2 L of normal saline in the emergency  department as well as KCl. She is feeling partially improved on repeat evaluation. She has no orthostasis in the ED. Plan to DC home with close outpatient follow-up for her poor oral intake. Discussed PCP and bariatric surgery follow-up.  UA with bacteria but no other evidence of UTI, will send cultures and only treat if positive.   Quintella Reichert, MD 03/24/16 [phone]

## 2016-03-24 NOTE — ED Notes (Addendum)
Patient reports falling in shower this morning, unsure of LOC, no anticoagulants. C/o lightheadedness, dizziness, nausea, emesis x4, dribbling with urine x2 days. Patient reports gastric sleeve surgery in April 2017.

## 2016-03-25 ENCOUNTER — Ambulatory Visit (INDEPENDENT_AMBULATORY_CARE_PROVIDER_SITE_OTHER): Payer: 59 | Admitting: Family Medicine

## 2016-03-25 ENCOUNTER — Encounter: Payer: Self-pay | Admitting: Family Medicine

## 2016-03-25 VITALS — BP 122/70 | HR 85 | Temp 98.3°F | Resp 17 | Ht 67.0 in | Wt 250.0 lb

## 2016-03-25 DIAGNOSIS — D509 Iron deficiency anemia, unspecified: Secondary | ICD-10-CM | POA: Diagnosis not present

## 2016-03-25 DIAGNOSIS — E860 Dehydration: Secondary | ICD-10-CM | POA: Diagnosis not present

## 2016-03-25 DIAGNOSIS — Z6841 Body Mass Index (BMI) 40.0 and over, adult: Secondary | ICD-10-CM

## 2016-03-25 DIAGNOSIS — R112 Nausea with vomiting, unspecified: Secondary | ICD-10-CM | POA: Diagnosis not present

## 2016-03-25 DIAGNOSIS — K219 Gastro-esophageal reflux disease without esophagitis: Secondary | ICD-10-CM | POA: Diagnosis not present

## 2016-03-25 DIAGNOSIS — Z9884 Bariatric surgery status: Secondary | ICD-10-CM

## 2016-03-25 DIAGNOSIS — Z9889 Other specified postprocedural states: Secondary | ICD-10-CM

## 2016-03-25 LAB — COMPREHENSIVE METABOLIC PANEL
ALT: 7 U/L (ref 6–29)
AST: 10 U/L (ref 10–30)
Albumin: 3.9 g/dL (ref 3.6–5.1)
Alkaline Phosphatase: 62 U/L (ref 33–115)
BILIRUBIN TOTAL: 0.4 mg/dL (ref 0.2–1.2)
BUN: 9 mg/dL (ref 7–25)
CALCIUM: 9.6 mg/dL (ref 8.6–10.2)
CO2: 20 mmol/L (ref 20–31)
Chloride: 106 mmol/L (ref 98–110)
Creat: 0.67 mg/dL (ref 0.50–1.10)
GLUCOSE: 77 mg/dL (ref 65–99)
POTASSIUM: 3.8 mmol/L (ref 3.5–5.3)
Sodium: 138 mmol/L (ref 135–146)
Total Protein: 7.4 g/dL (ref 6.1–8.1)

## 2016-03-25 LAB — POCT URINALYSIS DIP (MANUAL ENTRY)
Glucose, UA: NEGATIVE
Leukocytes, UA: NEGATIVE
NITRITE UA: NEGATIVE
PH UA: 5.5
RBC UA: NEGATIVE
Spec Grav, UA: >=1.03
Urobilinogen, UA: 1

## 2016-03-25 LAB — POCT CBC
Granulocyte percent: 56.4 %G (ref 37–80)
HCT, POC: 32.9 % — AB (ref 37.7–47.9)
HEMOGLOBIN: 10.5 g/dL — AB (ref 12.2–16.2)
Lymph, poc: 3.5 — AB (ref 0.6–3.4)
MCH: 22.2 pg — AB (ref 27–31.2)
MCHC: 32 g/dL (ref 31.8–35.4)
MCV: 69.6 fL — AB (ref 80–97)
MID (cbc): 0.5 (ref 0–0.9)
MPV: 10.4 fL (ref 0–99.8)
PLATELET COUNT, POC: 327 10*3/uL (ref 142–424)
POC Granulocyte: 5.2 (ref 2–6.9)
POC LYMPH PERCENT: 38.3 %L (ref 10–50)
POC MID %: 5.3 % (ref 0–12)
RBC: 4.73 M/uL (ref 4.04–5.48)
RDW, POC: 20.2 %
WBC: 9.2 10*3/uL (ref 4.6–10.2)

## 2016-03-25 LAB — LIPASE: Lipase: 16 U/L (ref 7–60)

## 2016-03-25 LAB — URINE CULTURE: Culture: 1000 — AB

## 2016-03-25 LAB — AMYLASE: Amylase: 27 U/L (ref 0–105)

## 2016-03-25 MED ORDER — ONDANSETRON 4 MG PO TBDP
4.0000 mg | ORAL_TABLET | Freq: Once | ORAL | Status: AC
  Administered 2016-03-25: 4 mg via ORAL

## 2016-03-25 MED ORDER — ONDANSETRON 8 MG PO TBDP: 8.0000 mg | ORAL_TABLET | Freq: Three times a day (TID) | ORAL | Status: DC | PRN

## 2016-03-25 NOTE — Patient Instructions (Addendum)
Return: Thursday, 6/29 (8-2) or Monday, 7/3 (8-5) or Wednesday, 7/5 4-6.     IF you received an x-ray today, you will receive an invoice from [facility]. Please contact [facility] at [phone] with questions or concerns regarding your invoice.   IF you received labwork today, you will receive an invoice from [employer]. Please contact [facility] at [phone] with questions or concerns regarding your invoice.   Our billing staff will not be able to assist you with questions regarding bills from these companies.  You will be contacted with the lab results as soon as they are available. The fastest way to get your results is to activate your My Chart account. Instructions are located on the last page of this paperwork. If you have not heard from [place] regarding the results in 2 weeks, please contact this office.

## 2016-03-25 NOTE — Progress Notes (Signed)
Subjective:    Patient ID: [name], female    DOB: Oct 11, 1975, 40 y.o.   MRN: [id number]  03/25/2016  Follow-up; Dizziness; Nausea; and Headache   HPI This 40 y.o. female presents for evaluation of dehydration, syncope, head contusion.  S/p 2 liters of NS in ED yesterday.  Urine pregnancy negative; urine culture pending; K 3.1; creatinine 0.81.  Evaluated 24 hours ago in ED.  Now feels worse than when presented to ED.  Worsened a few days ago.  No fever/sweats. +cold all the time.  +HA constant for five days.  S/p CT head and CT neck  Saw general surgeon not sure but thinks June 1; scheduled for EGD on 04/06/16 by Lucia Gaskins.  Last visit with nutritionist 03/11/16.  Next appointment with nutritionist on 04/01/16.   2 months after gastric sleeve.  Admission weight 297; now weighing 250.  Eating nothing currently.  Drinking minimal water during the day; does not drink water; trying to drink protein shakes but really gross.  Tried muscle milk but horrible.  Tries to drink a small sip each time.    Taking Protonix bid.  No Zofran; ran out; helped a little.  Prescribed Dilaudid for surgery; no longer taking.  Hair is falling out.  Crying excessively.  No active SI.  Dreading to wake up in the morning because of feeling horrible.  Getting tired of getting stuck.  Scared to drink or eat; does not know what to do.  ED provider recommended feeding tube to get nutrition.  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  Normal esophagus.  R arm shaking at times; etiology unclear.   Review of Systems  Constitutional: Negative for fever, chills, diaphoresis and fatigue.  Eyes: Negative for visual disturbance.  Respiratory: Negative for cough and shortness of breath.   Cardiovascular: Negative for chest pain, palpitations and leg swelling.  Gastrointestinal: Positive for nausea and vomiting.  Negative for abdominal pain, diarrhea, constipation, blood in stool, abdominal distention, anal bleeding and rectal pain.  Endocrine: Negative for cold intolerance, heat intolerance, polydipsia, polyphagia and polyuria.  Neurological: Positive for light-headedness. Negative for dizziness, tremors, seizures, syncope, facial asymmetry, speech difficulty, weakness, numbness and headaches.    Past Medical History  Diagnosis Date  . Bronchitis   . Asthma   . Clostridium difficile infection 04/20/2012  . Obesity   . Osteoarthritis     Both knees  . Shortness of breath     on exertion  . Internal and external bleeding hemorrhoids 06/11/2014  . Left sided chronic colitis - segmental 06/11/2014  . TOBACCO USER 10/02/2009    Qualifier: Diagnosis of  By: Dimas Millin MD, Ellard Artis    . Anal fissure - posterior 10/16/2014  . IDA (iron deficiency anemia)   . Ulcerative colitis ([place])   . Motion sickness   . Postoperative nausea and vomiting 01/21/2016  . Hiatal hernia    Past Surgical History  Procedure Laterality Date  . Hemrrhoid surgery    . Colonoscopy  2007    for rectal bleeding; [facility]  . Laparoscopic tubal ligation  10/16/2011    Procedure: LAPAROSCOPIC TUBAL LIGATION;  Surgeon: Alwyn Pea, MD;  Location: [facility];  Service: Gynecology;  Laterality: Bilateral;  . Foot surgery    . Tubal ligation    . Colonoscopy N/A 06/11/2014    Procedure: COLONOSCOPY;  Surgeon: Gatha Mayer, MD;  Location: [facility];  Service: Endoscopy;  Laterality: N/A;  . Knee arthroscopy Left 09/06/2014  . Novasure ablation  09/28/2010    mild persistent vaginal bleeding  . Dilation and curettage of uterus    . Laparoscopic gastric sleeve resection N/A 01/14/2016    Procedure: LAPAROSCOPIC GASTRIC SLEEVE RESECTION;  Surgeon: Excell Seltzer, MD;  Location: [facility];  Service: General;  Laterality: N/A;   Allergies  Allergen Reactions  . Oxycodone Anaphylaxis  . Ciprofloxacin Itching and Rash  . Doxycycline Nausea  And Vomiting  . Morphine And Related Swelling and Other (See Comments)    Pt reported throat swelling to morphine.  Has tolerated Norco and Dilaudid.  Marland Kitchen Penicillins Rash and Other (See Comments)    .Marland KitchenHas patient had a PCN reaction causing immediate rash, facial/tongue/throat swelling, SOB or lightheadedness with hypotension: Yes Has patient had a PCN reaction causing severe rash involving mucus membranes or skin necrosis: No Has patient had a PCN reaction that required hospitalization UNKNOWN - childhood allergy  Has patient had a PCN reaction occurring within the last 10 years: No If all of the above answers are "NO", then may proceed with Cephalosporin use.     Social History   Social History  . Marital Status: Single    Spouse Name: N/A  . Number of Children: N/A  . Years of Education: N/A   Occupational History  . Not on file.   Social History Main Topics  . Smoking status: Former Smoker -- 0.50 packs/day for 20 years    Types: Cigarettes    Quit date: 03/17/2014  . Smokeless tobacco: Never Used  . Alcohol Use: No  . Drug Use: No  . Sexual Activity: Yes    Birth Control/ Protection: Condom   Other Topics Concern  . Not on file   Social History Narrative   Marital status:  Single; not dating seriously.      Children:  3 sons (18, 24, 68) whose father is deceased, 1 daughter (deceased); no grandchildren      Lives:  With 2 sons      Employment:  Full time; Corporate treasurer at [employer]      Tobacco: quit June 2015      Alcohol:  None      Drugs:  None      Exercise:  Leg lifts for knee strengthening; walking   1 caffeine beverage daily      Sexual activity:  Active; total partners = up there.  No STDs in past.        Family History  Problem Relation Age of Onset  . Hypertension Mother   . Diabetes Mother   . Sarcoidosis Mother     lungs and skin  . Asthma Mother   . Hypertension Father   . Diabetes Father   . Asthma Son   . Asthma Sister   . Cancer  Sister     possible pancreatic cancer  . Adrenal disorder Sister     Tumor   . Asthma Brother   . Asthma Daughter     died age 274.5  . Cancer Daughter 4    brain; died age 274.5  . Asthma Son        Objective:    BP 122/70 mmHg  Pulse 85  Temp(Src) 98.3 F (36.8 C) (Oral)  Resp 17  Ht 5' 7"  (1.702 m)  Wt 250 lb (113.399 kg)  BMI 39.15 kg/m2  SpO2 100% Physical Exam  Constitutional: She is oriented to person,  place, and time. She appears well-developed and well-nourished. No distress.  Poorly groomed.  HENT:  Head: Normocephalic and atraumatic.  Right Ear: External ear normal.  Left Ear: External ear normal.  Nose: Nose normal.  Mouth/Throat: Oropharynx is clear and moist.  Eyes: Conjunctivae and EOM are normal. Pupils are equal, round, and reactive to light.  Neck: Normal range of motion. Neck supple. Carotid bruit is not present. No thyromegaly present.  Cardiovascular: Normal rate, regular rhythm, normal heart sounds and intact distal pulses.  Exam reveals no gallop and no friction rub.   No murmur heard. Pulmonary/Chest: Effort normal and breath sounds normal. She has no wheezes. She has no rales.  Abdominal: Soft. Bowel sounds are normal. She exhibits no distension and no mass. There is no tenderness. There is no rebound and no guarding.  Lymphadenopathy:    She has no cervical adenopathy.  Neurological: She is alert and oriented to person, place, and time. No cranial nerve deficit.  Skin: Skin is warm and dry. No rash noted. She is not diaphoretic. No erythema. No pallor.  Psychiatric: She has a normal mood and affect. Her behavior is normal.   Results for orders placed or performed in visit on 03/25/16  POCT CBC  Result Value Ref Range   WBC 9.2 4.6 - 10.2 K/uL   Lymph, poc 3.5 (A) 0.6 - 3.4   POC LYMPH PERCENT 38.3 10 - 50 %L   MID (cbc) 0.5 0 - 0.9   POC MID % 5.3 0 - 12 %M   POC Granulocyte 5.2 2 - 6.9   Granulocyte percent 56.4 37 - 80 %G   RBC 4.73 4.04  - 5.48 M/uL   Hemoglobin 10.5 (A) 12.2 - 16.2 g/dL   HCT, POC 32.9 (A) 37.7 - 47.9 %   MCV 69.6 (A) 80 - 97 fL   MCH, POC 22.2 (A) 27 - 31.2 pg   MCHC 32.0 31.8 - 35.4 g/dL   RDW, POC 20.2 %   Platelet Count, POC 327 142 - 424 K/uL   MPV 10.4 0 - 99.8 fL  POCT urinalysis dipstick  Result Value Ref Range   Color, UA yellow yellow   Clarity, UA clear clear   Glucose, UA negative negative   Bilirubin, UA large (A) negative   Ketones, POC UA large (80) (A) negative   Spec Grav, UA >=1.030    Blood, UA negative negative   pH, UA 5.5    Protein Ur, POC =100 (A) negative   Urobilinogen, UA 1.0    Nitrite, UA Negative Negative   Leukocytes, UA Negative Negative   Orthostatic VS for the past 24 hrs:  BP- Lying Pulse- Lying BP- Sitting Pulse- Sitting BP- Standing at 0 minutes Pulse- Standing at 0 minutes  03/25/16 1313 119/82 mmHg 67 119/82 mmHg 67 111/82 mmHg 63    Wt Readings from Last 3 Encounters:  03/25/16 250 lb (113.399 kg)  03/24/16 244 lb (110.678 kg)  03/11/16 259 lb 6.4 oz (117.663 kg)       Assessment & Plan:   1. Dehydration   2. Postoperative nausea and vomiting   3. S/P laparoscopic sleeve gastrectomy   4. Morbid obesity with BMI of 45.0-49.9, adult ([place])   5. Anemia, iron deficiency   6. Gastroesophageal reflux disease without esophagitis     Orders Placed This Encounter  Procedures  . Comprehensive metabolic panel  . Amylase  . Lipase  . Orthostatic vital signs  . POCT CBC  .  POCT urinalysis dipstick   Meds ordered this encounter  Medications  . ondansetron (ZOFRAN-ODT) 8 MG disintegrating tablet    Sig: Take 1 tablet (8 mg total) by mouth every 8 (eight) hours as needed for nausea.    Dispense:  20 tablet    Refill:  3  . ondansetron (ZOFRAN-ODT) disintegrating tablet 4 mg    Sig:   . ondansetron (ZOFRAN-ODT) disintegrating tablet 4 mg    Sig:     No Follow-up on file.    Suhailah Kwan Elayne Guerin, M.D. [facility] [address] [place], [place]  [address] [phone] phone [phone] fax

## 2016-03-26 ENCOUNTER — Encounter (HOSPITAL_COMMUNITY): Payer: Self-pay | Admitting: *Deleted

## 2016-04-01 ENCOUNTER — Ambulatory Visit (INDEPENDENT_AMBULATORY_CARE_PROVIDER_SITE_OTHER): Payer: 59 | Admitting: Family Medicine

## 2016-04-01 ENCOUNTER — Encounter: Payer: 59 | Attending: General Surgery | Admitting: Dietician

## 2016-04-01 ENCOUNTER — Encounter: Payer: Self-pay | Admitting: Dietician

## 2016-04-01 VITALS — BP 134/86 | HR 93 | Temp 98.6°F | Resp 18 | Ht 67.0 in | Wt 246.0 lb

## 2016-04-01 DIAGNOSIS — Z9884 Bariatric surgery status: Secondary | ICD-10-CM

## 2016-04-01 DIAGNOSIS — Z6841 Body Mass Index (BMI) 40.0 and over, adult: Secondary | ICD-10-CM | POA: Diagnosis not present

## 2016-04-01 DIAGNOSIS — E860 Dehydration: Secondary | ICD-10-CM

## 2016-04-01 DIAGNOSIS — M170 Bilateral primary osteoarthritis of knee: Secondary | ICD-10-CM | POA: Diagnosis not present

## 2016-04-01 DIAGNOSIS — R112 Nausea with vomiting, unspecified: Secondary | ICD-10-CM | POA: Diagnosis not present

## 2016-04-01 DIAGNOSIS — Z9889 Other specified postprocedural states: Secondary | ICD-10-CM

## 2016-04-01 DIAGNOSIS — K219 Gastro-esophageal reflux disease without esophagitis: Secondary | ICD-10-CM

## 2016-04-01 LAB — POCT CBC
GRANULOCYTE PERCENT: 61.9 % (ref 37–80)
HCT, POC: 32.3 % — AB (ref 37.7–47.9)
HEMOGLOBIN: 10.3 g/dL — AB (ref 12.2–16.2)
Lymph, poc: 3.7 — AB (ref 0.6–3.4)
MCH: 22.3 pg — AB (ref 27–31.2)
MCHC: 32 g/dL (ref 31.8–35.4)
MCV: 69.7 fL — AB (ref 80–97)
MID (cbc): 0.4 (ref 0–0.9)
MPV: 10.8 fL (ref 0–99.8)
PLATELET COUNT, POC: 344 10*3/uL (ref 142–424)
POC Granulocyte: 6.6 (ref 2–6.9)
POC LYMPH PERCENT: 34.5 %L (ref 10–50)
POC MID %: 3.6 %M (ref 0–12)
RBC: 4.63 M/uL (ref 4.04–5.48)
RDW, POC: 20.7 %
WBC: 10.7 10*3/uL — AB (ref 4.6–10.2)

## 2016-04-01 LAB — COMPREHENSIVE METABOLIC PANEL
ALBUMIN: 3.9 g/dL (ref 3.6–5.1)
ALT: 6 U/L (ref 6–29)
AST: 10 U/L (ref 10–30)
Alkaline Phosphatase: 63 U/L (ref 33–115)
BUN: 7 mg/dL (ref 7–25)
CHLORIDE: 106 mmol/L (ref 98–110)
CO2: 23 mmol/L (ref 20–31)
CREATININE: 0.73 mg/dL (ref 0.50–1.10)
Calcium: 9.1 mg/dL (ref 8.6–10.2)
Glucose, Bld: 76 mg/dL (ref 65–99)
Potassium: 3.3 mmol/L — ABNORMAL LOW (ref 3.5–5.3)
SODIUM: 140 mmol/L (ref 135–146)
Total Bilirubin: 0.5 mg/dL (ref 0.2–1.2)
Total Protein: 7.3 g/dL (ref 6.1–8.1)

## 2016-04-01 LAB — POCT URINALYSIS DIP (MANUAL ENTRY)
GLUCOSE UA: NEGATIVE
Leukocytes, UA: NEGATIVE
Nitrite, UA: NEGATIVE
PH UA: 6
PROTEIN UA: NEGATIVE
RBC UA: NEGATIVE
SPEC GRAV UA: 1.02
UROBILINOGEN UA: 1

## 2016-04-01 MED ORDER — CYANOCOBALAMIN 1000 MCG/ML IJ SOLN
1000.0000 ug | Freq: Once | INTRAMUSCULAR | Status: AC
  Administered 2016-04-01: 1000 ug via INTRAMUSCULAR

## 2016-04-01 NOTE — Progress Notes (Signed)
Subjective:    Patient ID: [name], female    DOB: 06-10-1976, 40 y.o.   MRN: [id number]  04/01/2016  Follow-up (dizzy,dehydration,b-12 injection)   HPI This 40 y.o. female presents for one week follow-up for dehydration, dizziness, nausea.   S/p nutrition consultation today; nutritionist concerned about rapid weight loss, ongoing nausea, vomiting.  Frustrated by lack of improvement. Both boys are gone since five days ago.  Weight down four pounds from last week.  Now vomiting and gagging with all food intake.  Really depressed about sons leaving.  Scheduled to see general surgeon in 2 days.  EGD scheduled in five days.  Unable to keep down solids.  Drinking 12 ounces of fluid daily.  Urinating once per day. Zofran has been helpful; vomiting less.  Unable to tolerate any solids; cannot drink protein shakes; causes gagging and vomiting.  Vomiting 2 times today which is better than usual.  Drinking ice chips and water.  Has drank 3 ounces of water.    Review of Systems  Constitutional: Negative for fever, chills, diaphoresis and fatigue.  Eyes: Negative for visual disturbance.  Respiratory: Negative for cough and shortness of breath.   Cardiovascular: Negative for chest pain, palpitations and leg swelling.  Gastrointestinal: Positive for nausea and vomiting. Negative for abdominal pain, constipation and diarrhea.  Endocrine: Negative for cold intolerance, heat intolerance, polydipsia, polyphagia and polyuria.  Neurological: Positive for dizziness. Negative for tremors, seizures, syncope, facial asymmetry, speech difficulty, weakness, light-headedness, numbness and headaches.    Past Medical History:  Diagnosis Date  . Anal fissure - posterior 10/16/2014  . Asthma   . Bronchitis   . Clostridium difficile infection 04/20/2012  . Hiatal hernia   . IDA (iron deficiency anemia)   . Internal and external bleeding hemorrhoids 06/11/2014  . Left sided chronic colitis - segmental 06/11/2014   . Motion sickness   . Obesity   . Osteoarthritis    Both knees  . Postoperative nausea and vomiting 01/21/2016  . Shortness of breath    on exertion  . TOBACCO USER 10/02/2009   Qualifier: Diagnosis of  By: Dimas Millin MD, Ellard Artis    . Transfusion history    last transfusion 6'16   . Ulcerative colitis [facility])    Past Surgical History:  Procedure Laterality Date  . COLONOSCOPY  2007   for rectal bleeding; [facility]  . COLONOSCOPY N/A 06/11/2014   Procedure: COLONOSCOPY;  Surgeon: Gatha Mayer, MD;  [facility];  Service: Endoscopy;  Laterality: N/A;  . DILATION AND CURETTAGE OF UTERUS    . ESOPHAGOGASTRODUODENOSCOPY (EGD) WITH PROPOFOL N/A 04/06/2016   Procedure: ESOPHAGOGASTRODUODENOSCOPY (EGD) WITH PROPOFOL;  Surgeon: Alphonsa Overall, MD;  Location: [facility];  Service: General;  Laterality: N/A;  . FOOT SURGERY    . Hemrrhoid Surgery    . KNEE ARTHROSCOPY Left 09/06/2014  . LAPAROSCOPIC GASTRIC SLEEVE RESECTION N/A 01/14/2016   Procedure: LAPAROSCOPIC GASTRIC SLEEVE RESECTION;  Surgeon: Excell Seltzer, MD;  Location: [facility];  Service: General;  Laterality: N/A;  . LAPAROSCOPIC TUBAL LIGATION  10/16/2011   Procedure: LAPAROSCOPIC TUBAL LIGATION;  Surgeon: Alwyn Pea, MD;  Location: [facility];  Service: Gynecology;  Laterality: Bilateral;  . NOVASURE ABLATION  09/28/2010   mild persistent vaginal bleeding  . TUBAL LIGATION     Allergies  Allergen Reactions  . Oxycodone Anaphylaxis  . Ciprofloxacin Itching and Rash  . Doxycycline Nausea And Vomiting  . Morphine And Related Swelling and Other (See Comments)  Pt reported throat swelling to morphine.  Has tolerated Norco and Dilaudid.  Marland Kitchen Penicillins Rash and Other (See Comments)    .Marland KitchenHas patient had a PCN reaction causing immediate rash, facial/tongue/throat swelling, SOB or lightheadedness with hypotension: Yes Has patient had a PCN reaction causing severe rash involving mucus membranes or skin necrosis: No Has patient had  a PCN reaction that required hospitalization UNKNOWN - childhood allergy  Has patient had a PCN reaction occurring within the last 10 years: No If all of the above answers are "NO", then may proceed with Cephalosporin use.     Social History   Social History  . Marital status: Single    Spouse name: N/A  . Number of children: N/A  . Years of education: N/A   Occupational History  . Not on file.   Social History Main Topics  . Smoking status: Former Smoker    Packs/day: 0.50    Years: 20.00    Types: Cigarettes    Quit date: 03/17/2014  . Smokeless tobacco: Never Used  . Alcohol use No  . Drug use: No  . Sexual activity: Yes    Birth control/ protection: Condom   Other Topics Concern  . Not on file   Social History Narrative   Marital status:  Single; not dating seriously.      Children:  3 sons (18, 60, 67) whose father is deceased, 1 daughter (deceased); no grandchildren      Lives:  With 2 sons      Employment:  Full time; Corporate treasurer at [employer]      Tobacco: quit June 2015      Alcohol:  None      Drugs:  None      Exercise:  Leg lifts for knee strengthening; walking   1 caffeine beverage daily      Sexual activity:  Active; total partners = up there.  No STDs in past.        Family History  Problem Relation Age of Onset  . Hypertension Mother   . Diabetes Mother   . Sarcoidosis Mother     lungs and skin  . Asthma Mother   . Hypertension Father   . Diabetes Father   . Asthma Son   . Asthma Sister   . Cancer Sister     possible pancreatic cancer  . Adrenal disorder Sister     Tumor   . Asthma Brother   . Asthma Daughter     died age 38.5  . Cancer Daughter 4    brain; died age 38.5  . Asthma Son        Objective:    BP 134/86   Pulse 93   Temp 98.6 F (37 C) (Oral)   Resp 18   Ht 5' 7"  (1.702 m)   Wt 246 lb (111.6 kg)   SpO2 99%   BMI 38.53 kg/m  Physical Exam  Constitutional: She is oriented to person, place, and time. She  appears well-developed and well-nourished. No distress.  HENT:  Head: Normocephalic and atraumatic.  Right Ear: External ear normal.  Left Ear: External ear normal.  Nose: Nose normal.  Mouth/Throat: Oropharynx is clear and moist.  Eyes: Conjunctivae and EOM are normal. Pupils are equal, round, and reactive to light.  Neck: Normal range of motion. Neck supple. Carotid bruit is not present. No thyromegaly present.  Cardiovascular: Normal rate, regular rhythm, normal heart sounds and intact distal pulses.  Exam reveals no  gallop and no friction rub.   No murmur heard. Pulmonary/Chest: Effort normal and breath sounds normal. She has no wheezes. She has no rales.  Abdominal: Soft. Bowel sounds are normal. She exhibits no distension and no mass. There is no tenderness. There is no rebound and no guarding.  Lymphadenopathy:    She has no cervical adenopathy.  Neurological: She is alert and oriented to person, place, and time. No cranial nerve deficit.  Skin: Skin is warm and dry. No rash noted. She is not diaphoretic. No erythema. No pallor.  Psychiatric: She has a normal mood and affect. Her behavior is normal.    No data found.     Assessment & Plan:   1. Postoperative nausea and vomiting   2. S/P laparoscopic sleeve gastrectomy   3. Dehydration   4. Gastroesophageal reflux disease without esophagitis    -persistent nausea with vomiting with dehydration. -s/p ivf in office. -scheduled for follow-up with general surgery this week; also scheduled for EGD. -obtain labs. -s/p B12 injection. -s/p follow-up with nutrition this week.  -no vomiting during office visit.  Orders Placed This Encounter  Procedures  . Comprehensive metabolic panel  . Orthostatic vital signs  . POCT CBC  . POCT urinalysis dipstick  . Insert peripheral IV   Meds ordered this encounter  Medications  . cyanocobalamin ((VITAMIN B-12)) injection 1,000 mcg    No Follow-up on file.    Estes Lehner Elayne Guerin, M.D. [facility] [address] [place], [place]  [address] [phone] phone [phone] fax

## 2016-04-01 NOTE — Patient Instructions (Signed)
     IF you received an x-ray today, you will receive an invoice from [facility]. Please contact  Radiology at [phone] with questions or concerns regarding your invoice.   IF you received labwork today, you will receive an invoice from [facility]. Please contact [facility] at [phone] with questions or concerns regarding your invoice.   Our billing staff will not be able to assist you with questions regarding bills from these companies.  You will be contacted with the lab results as soon as they are available. The fastest way to get your results is to activate your My Chart account. Instructions are located on the last page of this paperwork. If you have not heard from us regarding the results in 2 weeks, please contact this office.      

## 2016-04-01 NOTE — Patient Instructions (Addendum)
-  Continue to sip protein shake throughout the day as tolerated

## 2016-04-01 NOTE — Progress Notes (Signed)
Follow-up visit:  2.5 months Post-Operative Sleeve gastrectomy Surgery  Medical Nutrition Therapy:  Appt start time: 0935 end time:  1010  Primary concerns today: Post-operative Bariatric Surgery Nutrition Management.  03/11/2016: [name] returns today having lost a total of 42.6 lbs. She reports that she is unable to tolerate any solid foods and is still only taking in liquids. Having foaming and "sliming" when she eats anything. She states that when she eats she is chewing foods to "paste" consistency. She reports that it "takes an hour to eat a piece of chicken the size of her fingernail" and it causes vomiting. Saw Dr. Excell Seltzer (bariatric surgeon) on June 1. She state she had some "imaging" done and reports that swelling was noted. [name] reports that is takes her about 15 minutes to take in 1 oz of protein shake; only taking in about 24 oz of liquids throughout the day (at least 6 oz protein shake, decaf coffee with unflavored protein powder, water). Struggling with nausea. Takes protonix daily, zofran before eating but patient states that this does not help. Feels like her overall intake has "gotten worse." She has not been able to gradually increase her intake. Having pain in her ribs and in the middle of her back on the right side. She feels like her rib pain is due to frequent vomiting. Reports watery, yellow stools that burn.   04/01/2016: [name] returns today with reports of worsening symptoms. She continues to be unable to tolerate any solid food and is now unable to tolerate protein shakes. Patient reports that her PCP wanted her to be seen by nutrition to discuss feeding tube. Aleeta has an endoscopy scheduled on 7/10. She had an upper GI last week and states that she vomited the Barium solution. Patient reports feeling very fatigued, depressed all the time and has a chronic migraine and is especially sensitive to light. Fell in the shower last week; went to ED and received 2L fluid +  potassium. "If I move around too much I start throwing up." States that her doctor advised her to take sips of apple juice or anything she can tolerate. "I feel like I am starving to death." Having diarrhea when she has decaf coffee; has a bowel movement about 1x a day (yellow, watery stools that burn). Otherwise she may feel like she has to have a bowel movement and will only have gas. [name] states she is only taking in 8-10 oz water and sips of juice (about 2 oz per day) total daily. "I vomit every time I try to eat something." Threw up after 2 teaspoons of beans 2 nights ago. Continues to try different kinds of protein shake and states this causes vomiting. Patient reports the only solid food she has tolerated since surgery is 1 tsp tuna and a "pinch of chicken."   I spoke with Dr. Excell Seltzer, bariatric surgeon after meeting with the patient and he confirms that the patient's labs have been stable and indicate adequate hydration. Dr. Excell Seltzer also confirms that the patient's upper GI results were normal. After discussing these issues with Dr. Excell Seltzer, patient stated that sometimes just chewing food makes her sick.   Plan: Nurse to contact patient to be seen at [facility] this Friday, July 7. Patient is scheduled for endoscopy with Dr. Lucia Gaskins on July 10.  Surgery date: 01/14/2016 Surgery type: sleeve gastrectomy Start weight at [facility] 301 lbs on 11/18/2015, 302 lbs on 01/06/16 Weight today: 248.2 lbs Weight change: 11 lbs Total weight lost: 54 lbs  TANITA  BODY COMP RESULTS  01/06/16 01/28/16 03/11/16 04/01/16   BMI (kg/m^2) 47.3 43.4 40.6 38.9   Fat Mass (lbs) 171 154.5 137.8 128.8   Fat Free Mass (lbs) 131 122.5 121.6 119.4   Total Body Water (lbs) 96 89.5 90 88    Preferred Learning Style:   No preference indicated   Learning Readiness:   Ready  Fluid intake: ~12 oz  Estimated total protein intake: 0  Medications: see list Supplementation: has difficulty taking due to nausea and vomiting  (takes B12 shots)   Using straws: no Drinking while eating: n/a Hair loss: yes  Carbonated beverages: none N/V/D/C: diarrhea and vomiting Dumping syndrome: none  Recent physical activity:  Did not assess today  Progress Towards Goal(s):  In progress.  Handouts given during visit include:  none   Nutritional Diagnosis:  [place]-3.3 Overweight/obesity related to past poor dietary habits and physical inactivity as evidenced by patient w/ recent sleeve gastrectomy surgery following dietary guidelines for continued weight loss.     Intervention:  Nutrition counseling provided. Encouraged patient to continue sipping fluids throughout the day as tolerated and follow up with surgeon.   Teaching Method Utilized:  Visual Auditory Hands on  Barriers to learning/adherence to lifestyle change: inability to tolerate adequate fluid and protein  Demonstrated degree of understanding via:  Teach Back   Monitoring/Evaluation:  Dietary intake, exercise, and body weight. Follow up in 3-4 weeks.

## 2016-04-04 ENCOUNTER — Observation Stay (HOSPITAL_COMMUNITY)
Admission: EM | Admit: 2016-04-04 | Discharge: 2016-04-06 | Disposition: A | Discharge status: Home / Self Care | Payer: 59 | Attending: Internal Medicine | Admitting: Internal Medicine

## 2016-04-04 ENCOUNTER — Telehealth: Payer: Self-pay | Admitting: Internal Medicine

## 2016-04-04 DIAGNOSIS — K921 Melena: Secondary | ICD-10-CM

## 2016-04-04 DIAGNOSIS — G8929 Other chronic pain: Secondary | ICD-10-CM | POA: Insufficient documentation

## 2016-04-04 DIAGNOSIS — K644 Residual hemorrhoidal skin tags: Secondary | ICD-10-CM | POA: Diagnosis present

## 2016-04-04 DIAGNOSIS — M170 Bilateral primary osteoarthritis of knee: Secondary | ICD-10-CM | POA: Diagnosis not present

## 2016-04-04 DIAGNOSIS — R112 Nausea with vomiting, unspecified: Secondary | ICD-10-CM | POA: Diagnosis not present

## 2016-04-04 DIAGNOSIS — D62 Acute posthemorrhagic anemia: Secondary | ICD-10-CM | POA: Diagnosis not present

## 2016-04-04 DIAGNOSIS — Z9889 Other specified postprocedural states: Secondary | ICD-10-CM

## 2016-04-04 DIAGNOSIS — Z6838 Body mass index (BMI) 38.0-38.9, adult: Secondary | ICD-10-CM | POA: Insufficient documentation

## 2016-04-04 DIAGNOSIS — Z79899 Other long term (current) drug therapy: Secondary | ICD-10-CM | POA: Insufficient documentation

## 2016-04-04 DIAGNOSIS — Z7951 Long term (current) use of inhaled steroids: Secondary | ICD-10-CM | POA: Diagnosis not present

## 2016-04-04 DIAGNOSIS — Z9884 Bariatric surgery status: Secondary | ICD-10-CM

## 2016-04-04 DIAGNOSIS — J45909 Unspecified asthma, uncomplicated: Secondary | ICD-10-CM | POA: Diagnosis not present

## 2016-04-04 DIAGNOSIS — F329 Major depressive disorder, single episode, unspecified: Secondary | ICD-10-CM | POA: Diagnosis not present

## 2016-04-04 DIAGNOSIS — K625 Hemorrhage of anus and rectum: Principal | ICD-10-CM | POA: Insufficient documentation

## 2016-04-04 DIAGNOSIS — K5190 Ulcerative colitis, unspecified, without complications: Secondary | ICD-10-CM | POA: Diagnosis not present

## 2016-04-04 DIAGNOSIS — Z87891 Personal history of nicotine dependence: Secondary | ICD-10-CM | POA: Diagnosis not present

## 2016-04-04 DIAGNOSIS — K922 Gastrointestinal hemorrhage, unspecified: Secondary | ICD-10-CM | POA: Diagnosis present

## 2016-04-04 DIAGNOSIS — K648 Other hemorrhoids: Secondary | ICD-10-CM

## 2016-04-04 LAB — BASIC METABOLIC PANEL
ANION GAP: 7 (ref 5–15)
BUN: 6 mg/dL (ref 6–20)
CALCIUM: 8.6 mg/dL — AB (ref 8.9–10.3)
CHLORIDE: 111 mmol/L (ref 101–111)
CO2: 21 mmol/L — AB (ref 22–32)
CREATININE: 0.7 mg/dL (ref 0.44–1.00)
GFR calc Af Amer: >60 mL/min (ref >60)
GFR calc non Af Amer: >60 mL/min (ref >60)
GLUCOSE: 75 mg/dL (ref 65–99)
Potassium: 3.7 mmol/L (ref 3.5–5.1)
Sodium: 139 mmol/L (ref 135–145)

## 2016-04-04 LAB — CBC WITH DIFFERENTIAL/PLATELET
BASOS PCT: 0 %
Basophils Absolute: 0 10*3/uL (ref 0.0–0.1)
EOS ABS: 0.2 10*3/uL (ref 0.0–0.7)
EOS PCT: 3 %
HCT: 27.1 % — ABNORMAL LOW (ref 36.0–46.0)
HEMOGLOBIN: 8.4 g/dL — AB (ref 12.0–15.0)
LYMPHS PCT: 35 %
Lymphs Abs: 2.8 10*3/uL (ref 0.7–4.0)
MCH: 21.6 pg — AB (ref 26.0–34.0)
MCHC: 31 g/dL (ref 30.0–36.0)
MCV: 69.7 fL — AB (ref 78.0–100.0)
Monocytes Absolute: 0.5 10*3/uL (ref 0.1–1.0)
Monocytes Relative: 6 %
NEUTROS ABS: 4.5 10*3/uL (ref 1.7–7.7)
NEUTROS PCT: 56 %
Platelets: 255 10*3/uL (ref 150–400)
RBC: 3.89 MIL/uL (ref 3.87–5.11)
RDW: 19.7 % — ABNORMAL HIGH (ref 11.5–15.5)
WBC: 8 10*3/uL (ref 4.0–10.5)

## 2016-04-04 LAB — PREPARE RBC (CROSSMATCH)

## 2016-04-04 MED ORDER — PANTOPRAZOLE SODIUM 40 MG PO TBEC
40.0000 mg | DELAYED_RELEASE_TABLET | Freq: Two times a day (BID) | ORAL | Status: DC
  Administered 2016-04-05 – 2016-04-06 (×3): 40 mg via ORAL
  Filled 2016-04-04 (×4): qty 1

## 2016-04-04 MED ORDER — SODIUM CHLORIDE 0.9 % IV BOLUS (SEPSIS)
250.0000 mL | Freq: Once | INTRAVENOUS | Status: AC
  Administered 2016-04-04: 250 mL via INTRAVENOUS

## 2016-04-04 MED ORDER — ACETAMINOPHEN 325 MG PO TABS: 650.0000 mg | ORAL_TABLET | Freq: Four times a day (QID) | ORAL | Status: DC | PRN

## 2016-04-04 MED ORDER — DIPHENHYDRAMINE HCL 50 MG/ML IJ SOLN
25.0000 mg | Freq: Four times a day (QID) | INTRAMUSCULAR | Status: DC | PRN
  Administered 2016-04-04 – 2016-04-05 (×2): 25 mg via INTRAVENOUS
  Filled 2016-04-04 (×2): qty 1

## 2016-04-04 MED ORDER — ACETAMINOPHEN 650 MG RE SUPP
650.0000 mg | Freq: Four times a day (QID) | RECTAL | Status: DC | PRN
Start: 2016-04-04 — End: 2016-04-05

## 2016-04-04 MED ORDER — SODIUM CHLORIDE 0.9% FLUSH
3.0000 mL | Freq: Two times a day (BID) | INTRAVENOUS | Status: DC
  Administered 2016-04-04: 3 mL via INTRAVENOUS

## 2016-04-04 MED ORDER — SODIUM CHLORIDE 0.9 % IV SOLN
INTRAVENOUS | Status: DC
  Administered 2016-04-04: 20:00:00 via INTRAVENOUS

## 2016-04-04 MED ORDER — SODIUM CHLORIDE 0.9 % IV SOLN: Freq: Once | INTRAVENOUS | Status: DC

## 2016-04-04 MED ORDER — SODIUM CHLORIDE 0.9 % IV SOLN
INTRAVENOUS | Status: DC
  Administered 2016-04-04: 16:00:00 via INTRAVENOUS

## 2016-04-04 MED ORDER — HYDROMORPHONE HCL 1 MG/ML IJ SOLN
1.0000 mg | INTRAMUSCULAR | Status: DC | PRN
Start: 2016-04-04 — End: 2016-04-05
  Administered 2016-04-04: 1 mg via INTRAVENOUS
  Filled 2016-04-04: qty 1

## 2016-04-04 MED ORDER — ALBUTEROL SULFATE (2.5 MG/3ML) 0.083% IN NEBU
3.0000 mL | INHALATION_SOLUTION | Freq: Four times a day (QID) | RESPIRATORY_TRACT | Status: DC | PRN
Start: 2016-04-04 — End: 2016-04-06

## 2016-04-04 MED ORDER — HYDROMORPHONE HCL 2 MG/ML IJ SOLN
2.0000 mg | Freq: Once | INTRAMUSCULAR | Status: AC
  Administered 2016-04-04: 2 mg via INTRAMUSCULAR
  Filled 2016-04-04: qty 1

## 2016-04-04 MED ORDER — FAMOTIDINE IN NACL 20-0.9 MG/50ML-% IV SOLN
20.0000 mg | Freq: Two times a day (BID) | INTRAVENOUS | Status: DC
  Administered 2016-04-04: 20 mg via INTRAVENOUS
  Filled 2016-04-04: qty 50

## 2016-04-04 MED ORDER — ONDANSETRON 4 MG PO TBDP: 8.0000 mg | ORAL_TABLET | Freq: Three times a day (TID) | ORAL | Status: DC | PRN

## 2016-04-04 MED ORDER — FLUTICASONE PROPIONATE 50 MCG/ACT NA SUSP: 1.0000 | Freq: Every day | NASAL | Status: DC | PRN

## 2016-04-04 MED ORDER — ONDANSETRON HCL 4 MG/2ML IJ SOLN
4.0000 mg | Freq: Four times a day (QID) | INTRAMUSCULAR | Status: DC | PRN
  Administered 2016-04-04 – 2016-04-06 (×5): 4 mg via INTRAVENOUS
  Filled 2016-04-04 (×4): qty 2

## 2016-04-04 MED ORDER — ONDANSETRON HCL 4 MG PO TABS: 4.0000 mg | ORAL_TABLET | Freq: Four times a day (QID) | ORAL | Status: DC | PRN

## 2016-04-04 NOTE — ED Notes (Signed)
Pt had one episode of emesis and then felt better. Pt was provided with wet cloth and phone. Pt is A&O and in NAD

## 2016-04-04 NOTE — Consult Note (Signed)
Re:   [name] DOB:   04/13/1976 MRN:   [id number]   [facility] consultation  ASSESSMENT AND PLAN: 1.  Rectal bleeding  Apparently due to thrombosed hemorrhoids - the skin over the hemorrhoid has already necrosed and the hemorrhoid has partially decompressed  Plan:  Sitz baths and symptomatic treatment, the hemorrhoid does not need surgery, but will hurt for several more days.  Anemia on admission - to get transfused.  2.  Persistent nausea and vomiting  She is for an upper endoscopy by me on Monday, 7/10.  Probably just keep in hospital until then.  3.  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 [facility]  4.  Morbid obesity - she has lost about 60 pounds since December 2016  5.  Depression from complicated post op course  6.  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 Marjorie Smolder when her weight gets down - she thinks that she is at goal 7.  Ulcerative colitis - was on meds for this, but with nausea since sleeve, she has not been on the meds  Followed by Dr. Carlean Purl.  Chief Complaint  Patient presents with  . Rectal Bleeding  . Emesis   REFERRING PHYSICIAN: SMITH,KRISTI, MD  HISTORY OF PRESENT ILLNESS: TYNESIA HARRAL is a 40 y.o. (DOB: 01-Nov-1975)  AA  female whose primary care physician is SMITH,KRISTI, MD and comes to the [facility] ER today for rectal bleeding.    Dr. Tyrell Antonio spoke to me earlier this evening about Ms. Jaskiewicz.   She is status post sleeve gastrectomy on January 14, 2016, approaching 3 months postoperatively, by Dr. Jacinto Reap. [facility].  Her weight in Dec 2016 was 309, BMI - 49.5.  She saw Dr. Excell Seltzer on Friday, 04/03/2016, but was not having significant rectal pain or bleeding at that time.  The pain all began early this morning - 2 to 3 AM.  She has known hemorrhoids and had "surgery" on these about 15 years ago, but cannot remember the surgeon's name.  (In Epic, it appears that Dr. Rise Patience did a 3 column hemorrhoidectomy on 10/18/2004.)  Her postoperative course has  been complicated by persistent nausea and vomiting. We did obtain an upper GI series about 2 weeks ago which showed actually prompt emptying of barium from her sleeve without obvious stricture or anatomic abnormality. A barium tablet did hang up in her upper esophagus, uncertain significance. Her symptoms are unchanged to possibly worse. She will sometimes get nausea and retching with just motion without oral intake or sometimes just from taste or sometimes after it seems to reach her stomach causing cramps and nausea. Not taking any solid food at this point.   She has a history of ulcerative colitis.  She is not on meds for this (because of nausea).  She was on Lialda.  Dr. Carlean Purl has followed her for this.  He did a colonoscopy on 06/11/2014 which showed a "segmental" colitis - 35 to 45 cm from anal verge.  The pathology showed colitis.  Odd for UC to be segmental.  Her Hgb - 8.4 - 04/04/2016  She was tentatively set for me to do an upper endoscopy on 04/06/2016 at [place].    Past Medical History  Diagnosis Date  . Bronchitis   . Asthma   . Clostridium difficile infection 04/20/2012  . Obesity   . Osteoarthritis     Both knees  . Shortness of breath     on exertion  . Internal and  external bleeding hemorrhoids 06/11/2014  . Left sided chronic colitis - segmental 06/11/2014  . TOBACCO USER 10/02/2009    Qualifier: Diagnosis of  By: Dimas Millin MD, Ellard Artis    . Anal fissure - posterior 10/16/2014  . IDA (iron deficiency anemia)   . Ulcerative colitis ([place])   . Motion sickness   . Postoperative nausea and vomiting 01/21/2016  . Hiatal hernia   . Transfusion history     last transfusion 6'16       Past Surgical History  Procedure Laterality Date  . Hemrrhoid surgery    . Colonoscopy  2007    for rectal bleeding; [facility]  . Laparoscopic tubal ligation  10/16/2011    Procedure: LAPAROSCOPIC TUBAL LIGATION;  Surgeon: Alwyn Pea, MD;  Location: [facility];  Service: Gynecology;  Laterality:  Bilateral;  . Foot surgery    . Tubal ligation    . Colonoscopy N/A 06/11/2014    Procedure: COLONOSCOPY;  Surgeon: Gatha Mayer, MD;  [facility];  Service: Endoscopy;  Laterality: N/A;  . Knee arthroscopy Left 09/06/2014  . Novasure ablation  09/28/2010    mild persistent vaginal bleeding  . Dilation and curettage of uterus    . Laparoscopic gastric sleeve resection N/A 01/14/2016    Procedure: LAPAROSCOPIC GASTRIC SLEEVE RESECTION;  Surgeon: Excell Seltzer, MD;  Location: [facility];  Service: General;  Laterality: N/A;      Current Facility-Administered Medications  Medication Dose Route Frequency Provider Last Rate Last Dose  . 0.9 %  sodium chloride infusion   Intravenous Continuous Fredia Sorrow, MD 100 mL/hr at 04/04/16 1554    . 0.9 %  sodium chloride infusion   Intravenous Once Belkys A Regalado, MD      . 0.9 %  sodium chloride infusion   Intravenous Continuous Belkys A Regalado, MD 100 mL/hr at 04/04/16 2000    . acetaminophen (TYLENOL) tablet 650 mg  650 mg Oral Q6H PRN Belkys A Regalado, MD       Or  . acetaminophen (TYLENOL) suppository 650 mg  650 mg Rectal Q6H PRN Belkys A Regalado, MD      . albuterol (PROVENTIL) (2.5 MG/3ML) 0.083% nebulizer solution 3 mL  3 mL Inhalation Q6H PRN Belkys A Regalado, MD      . diphenhydrAMINE (BENADRYL) injection 25 mg  25 mg Intravenous Q6H PRN Fransico Meadow, PA-C   25 mg at 04/04/16 2129  . famotidine (PEPCID) IVPB 20 mg premix  20 mg Intravenous Q12H Belkys A Regalado, MD   20 mg at 04/04/16 2129  . fluticasone (FLONASE) 50 MCG/ACT nasal spray 1 spray  1 spray Each Nare Daily PRN Belkys A Regalado, MD      . HYDROmorphone (DILAUDID) injection 1 mg  1 mg Intravenous Q4H PRN Fransico Meadow, PA-C   1 mg at 04/04/16 2129  . ondansetron (ZOFRAN) tablet 4 mg  4 mg Oral Q6H PRN Belkys A Regalado, MD       Or  . ondansetron (ZOFRAN) injection 4 mg  4 mg Intravenous Q6H PRN Belkys A Regalado, MD   4 mg at 04/04/16 1849  .  ondansetron (ZOFRAN-ODT) disintegrating tablet 8 mg  8 mg Oral Q8H PRN Belkys A Regalado, MD      . pantoprazole (PROTONIX) EC tablet 40 mg  40 mg Oral BID Belkys A Regalado, MD   40 mg at 04/04/16 2129  . sodium chloride flush (NS) 0.9 % injection 3 mL  3 mL Intravenous Q12H  Elmarie Shiley, MD   3 mL at 04/04/16 2131      Allergies  Allergen Reactions  . Oxycodone Anaphylaxis  . Ciprofloxacin Itching and Rash  . Doxycycline Nausea And Vomiting  . Morphine And Related Swelling and Other (See Comments)    Pt reported throat swelling to morphine.  Has tolerated Norco and Dilaudid.  Marland Kitchen Penicillins Rash and Other (See Comments)    .Marland KitchenHas patient had a PCN reaction causing immediate rash, facial/tongue/throat swelling, SOB or lightheadedness with hypotension: Yes Has patient had a PCN reaction causing severe rash involving mucus membranes or skin necrosis: No Has patient had a PCN reaction that required hospitalization UNKNOWN - childhood allergy  Has patient had a PCN reaction occurring within the last 10 years: No If all of the above answers are "NO", then may proceed with Cephalosporin use.     REVIEW OF SYSTEMS: Skin:  No history of rash.  No history of abnormal moles. Infection:  History of C. Diff. Neurologic:  No history of stroke.  No history of seizure.  No history of headaches. Cardiac:  No history of hypertension. No history of heart disease.  No history of prior cardiac catheterization.  No history of seeing a cardiologist. Pulmonary:  Asthma  Endocrine:  No diabetes. No thyroid disease. Gastrointestinal:  See HPI Urologic:  No history of kidney stones.  No history of bladder infections. Musculoskeletal:  No history of joint or back disease. Hematologic:  No bleeding disorder.  No history of anemia.  Not anticoagulated. Psycho-social:  Depressed by continued symptoms.  SOCIAL and FAMILY HISTORY: Unmarried. Works at [employer].  But she last worked last August,  2016.  She was out for her knees.  I think she has occasionally gone in and answered the phone. Has 3 children:  11,10, and 8 yo.   Their father died in an auto accident, but she was not married to him.  PHYSICAL EXAM: BP 122/73 mmHg  Pulse 60  Temp(Src) 98.2 F (36.8 C) (Oral)  Resp 16  Ht 5' 7"  (1.702 m)  Wt 111.585 kg (246 lb)  BMI 38.52 kg/m2  SpO2 100%  LMP 02/28/2016  General: obese AA F who is alert. HEENT: Normal. Pupils equal. Neck: Supple. No mass.  No thyroid mass. Lymph Nodes:  No supraclavicular or cervical nodes. Lungs: Clear to auscultation and symmetric breath sounds. Heart:  RRR. No murmur or rub.  Abdomen: Soft. No mass. No tenderness. No hernia. Normal bowel sounds.  Incisions look good.  She has no abdominal tenderness. Rectal: She has a 2 cm thrombosed hemorrhoid on the left side.  There is an area of skin necrosis - the hemorrhoid is partially decompressed. Extremities:  Good strength and ROM  in upper and lower extremities. Neurologic:  Grossly intact to motor and sensory function. Psychiatric: Has normal mood and affect. Behavior is normal.   DATA REVIEWED: Epic notes  Alphonsa Overall, MD,  [facility] [address] [place]  [place], [place]    [place] Phone:  [place]:  [phone]

## 2016-04-04 NOTE — Telephone Encounter (Signed)
[facility] patient last seen > 1 year. Has documented history of internal and external hemorrhoids as well as fissure. Call complaining severe rectal pain " can't sit'. Some bleeding but mostly painful fullness of hemorrhoids. Tells me [name] has had thrombosed hemorrhoid in past. [name] feels it may be the same. Told to go to ER for evaluation and treatment. [name] agreed

## 2016-04-04 NOTE — ED Notes (Signed)
Pt here for rectal pain/bleeding secondary to vomiting.  States d/t her vomiting her hemmorhoids have come out and now unable to sit and they are bleeding as well.  Pt had gastric sleeve done in April of this year and usually vomits when trying to keep food/drink down.

## 2016-04-04 NOTE — Progress Notes (Signed)
When completing the admission assessment, the patient answered yes to some of the suicide risk questions. The patient further explained her reasons for responding yes. She is having a difficult time with coping with her post op complications and has been discussing this with her doctor. She admitted to having thoughts of "wishing this was just over" and "what if I did jump" but also stated "I would never do anything to harm myself because I have children and I am all they have." RN spoke with CN and AC and came to agree that pt was not at risk for harming self. Callie Fielding RN

## 2016-04-04 NOTE — H&P (Addendum)
History and Physical    [name] QTM:[id number] DOB: Jan 28, 1976 DOA: 04/04/2016  PCP: Reginia Forts, MD  Patient coming from: Home   Chief Complaint: blood per rectum.   HPI: [name] is a 40 y.o. female with medical history significant of S/P laparoscopic sleeve gastrectomy by Dr Excell Seltzer [phone] who presents today with rectal bleeding and rectal pain that worse overnight. She report intermitting blood in the stool for last week. She relates that last night she had significant amount of blood per rectum. She had another episode in the ED. She relates intermittent nausea and vomiting since the gastric surgery and cramping abdominal pain. She is complaining of cramping abdominal pain now and she just vomited. She is also feeling weak, dizzy.   ED Course: Hb drop to 8 from 10, Co 2 at 21, vitals and blood pressure stable.   Review of Systems: As per HPI otherwise 10 point review of systems negative.    Past Medical History  Diagnosis Date  . Bronchitis   . Asthma   . Clostridium difficile infection 04/20/2012  . Obesity   . Osteoarthritis     Both knees  . Shortness of breath     on exertion  . Internal and external bleeding hemorrhoids 06/11/2014  . Left sided chronic colitis - segmental 06/11/2014  . TOBACCO USER 10/02/2009    Qualifier: Diagnosis of  By: Dimas Millin MD, Ellard Artis    . Anal fissure - posterior 10/16/2014  . IDA (iron deficiency anemia)   . Ulcerative colitis ([place])   . Motion sickness   . Postoperative nausea and vomiting 01/21/2016  . Hiatal hernia   . Transfusion history     last transfusion 6'16     Past Surgical History  Procedure Laterality Date  . Hemrrhoid surgery    . Colonoscopy  2007    for rectal bleeding; [facility]  . Laparoscopic tubal ligation  10/16/2011    Procedure: LAPAROSCOPIC TUBAL LIGATION;  Surgeon: Alwyn Pea, MD;  Location: [place] [facility];  Service: Gynecology;  Laterality: Bilateral;  . Foot surgery    . Tubal ligation    .  Colonoscopy N/A 06/11/2014    Procedure: COLONOSCOPY;  Surgeon: Gatha Mayer, MD;  [facility];  Service: Endoscopy;  Laterality: N/A;  . Knee arthroscopy Left 09/06/2014  . Novasure ablation  09/28/2010    mild persistent vaginal bleeding  . Dilation and curettage of uterus    . Laparoscopic gastric sleeve resection N/A 01/14/2016    Procedure: LAPAROSCOPIC GASTRIC SLEEVE RESECTION;  Surgeon: Excell Seltzer, MD;  Location[facility];  Service: General;  Laterality: N/A;     reports that she quit smoking about 2 years ago. Her smoking use included Cigarettes. She has a 10 pack-year smoking history. She has never used smokeless tobacco. She reports that she does not drink alcohol or use illicit drugs.  Allergies  Allergen Reactions  . Oxycodone Anaphylaxis  . Ciprofloxacin Itching and Rash  . Doxycycline Nausea And Vomiting  . Morphine And Related Swelling and Other (See Comments)    Pt reported throat swelling to morphine.  Has tolerated Norco and Dilaudid.  Marland Kitchen Penicillins Rash and Other (See Comments)    .Marland KitchenHas patient had a PCN reaction causing immediate rash, facial/tongue/throat swelling, SOB or lightheadedness with hypotension: Yes Has patient had a PCN reaction causing severe rash involving mucus membranes or skin necrosis: No Has patient had a PCN reaction that required hospitalization UNKNOWN - childhood allergy  Has patient had  a PCN reaction occurring within the last 10 years: No If all of the above answers are "NO", then may proceed with Cephalosporin use.     Family History  Problem Relation Age of Onset  . Hypertension Mother   . Diabetes Mother   . Sarcoidosis Mother     lungs and skin  . Asthma Mother   . Hypertension Father   . Diabetes Father   . Asthma Son   . Asthma Sister   . Cancer Sister     possible pancreatic cancer  . Adrenal disorder Sister     Tumor   . Asthma Brother   . Asthma Daughter     died age 80.5  . Cancer Daughter 4    brain;  died age 80.5  . Asthma Son      Prior to Admission medications   Medication Sig Start Date End Date Taking? Authorizing Provider  albuterol (PROVENTIL HFA;VENTOLIN HFA) 108 (90 Base) MCG/ACT inhaler Inhale 2 puffs into the lungs every 6 (six) hours as needed for wheezing or shortness of breath. For shortness of breath 09/30/15  Yes Wardell Honour, MD  CALCIUM CITRATE PO Take 500 mg by mouth 2 (two) times daily. Reported on 04/01/2016   Yes Historical Provider, MD  cyanocobalamin (,VITAMIN B-12,) 1000 MCG/ML injection Inject 1,000 mcg into the muscle every 30 (thirty) days. Reported on 04/01/2016   Yes Historical Provider, MD  Cyanocobalamin (VITAMIN B-12) 1000 MCG SUBL Place 1,000 mcg under the tongue daily as needed. Only takes when hasn't had shot in 30 days   Yes Historical Provider, MD  diclofenac sodium (VOLTAREN) 1 % GEL Apply 1 application topically 4 (four) times daily as needed (For knee pain.). Reported on 04/01/2016   Yes Historical Provider, MD  EPINEPHrine 0.3 mg/0.3 mL IJ SOAJ injection Inject 0.3 mg into the muscle once as needed (For anaphylaxis.). Reported on 04/01/2016   Yes Historical Provider, MD  fluticasone (FLONASE) 50 MCG/ACT nasal spray Place 1 spray into both nostrils daily as needed for allergies. Reported on 04/01/2016   Yes Historical Provider, MD  Multiple Vitamins-Minerals (MULTIVITAMIN & MINERAL PO) Take 1 tablet by mouth 2 (two) times daily. Reported on 04/01/2016   Yes Historical Provider, MD  ondansetron (ZOFRAN-ODT) 8 MG disintegrating tablet Take 1 tablet (8 mg total) by mouth every 8 (eight) hours as needed for nausea. 03/25/16  Yes Wardell Honour, MD  pantoprazole (PROTONIX) 40 MG tablet Take 40 mg by mouth 2 (two) times daily. Reported on 04/01/2016 01/09/16  Yes Historical Provider, MD    Physical Exam: Filed Vitals:   04/04/16 1734 04/04/16 1741 04/04/16 1748 04/04/16 1755  BP:      Pulse: 72 65 74 60  Temp:      TempSrc:      Resp:  13    Height:      Weight:        SpO2: 93% 93% 100% 100%      Constitutional: NAD, calm, comfortable Filed Vitals:   04/04/16 1734 04/04/16 1741 04/04/16 1748 04/04/16 1755  BP:      Pulse: 72 65 74 60  Temp:      TempSrc:      Resp:  13    Height:      Weight:      SpO2: 93% 93% 100% 100%   Eyes: PERRL, lids and conjunctivae normal ENMT: Mucous membranes are moist. Posterior pharynx clear of any exudate or lesions.Normal dentition.  Neck: normal, supple,  no masses, no thyromegaly Respiratory: clear to auscultation bilaterally, no wheezing, no crackles. Normal respiratory effort. No accessory muscle use.  Cardiovascular: Regular rate and rhythm, no murmurs / rubs / gallops. No extremity edema. 2+ pedal pulses. No carotid bruits.  Abdomen: mild epigastric tenderness, no masses palpated. No hepatosplenomegaly. Bowel sounds positive.  Rectal exam; small thrombose hemorrhoids with partial ulcerations.  Musculoskeletal: no clubbing / cyanosis. No joint deformity upper and lower extremities. Good ROM, no contractures. Normal muscle tone.  Skin: no rashes, lesions, ulcers. No induration Neurologic: CN 2-12 grossly intact. Sensation intact, DTR normal. Strength 5/5 in all 4.  Psychiatric: Normal judgment and insight. Alert and oriented x 3. Normal mood.     Labs on Admission: I have personally reviewed following labs and imaging studies  CBC:  Recent Labs Lab 04/01/16 1651 04/04/16 1449  WBC 10.7* 8.0  NEUTROABS  --  4.5  HGB 10.3* 8.4*  HCT 32.3* 27.1*  MCV 69.7* 69.7*  PLT  --  973   Basic Metabolic Panel:  Recent Labs Lab 04/01/16 1639 04/04/16 1449  NA 140 139  K 3.3* 3.7  CL 106 111  CO2 23 21*  GLUCOSE 76 75  BUN 7 6  CREATININE 0.73 0.70  CALCIUM 9.1 8.6*   GFR: Estimated Creatinine Clearance: 122.8 mL/min (by C-G formula based on Cr of 0.7). Liver Function Tests:  Recent Labs Lab 04/01/16 1639  AST 10  ALT 6  ALKPHOS 63  BILITOT 0.5  PROT 7.3  ALBUMIN 3.9   No results for  input(s): LIPASE, AMYLASE in the last 168 hours. No results for input(s): AMMONIA in the last 168 hours. Coagulation Profile: No results for input(s): INR, PROTIME in the last 168 hours. Cardiac Enzymes: No results for input(s): CKTOTAL, CKMB, CKMBINDEX, TROPONINI in the last 168 hours. BNP (last 3 results) No results for input(s): PROBNP in the last 8760 hours. HbA1C: No results for input(s): HGBA1C in the last 72 hours. CBG: No results for input(s): GLUCAP in the last 168 hours. Lipid Profile: No results for input(s): CHOL, HDL, LDLCALC, TRIG, CHOLHDL, LDLDIRECT in the last 72 hours. Thyroid Function Tests: No results for input(s): TSH, T4TOTAL, FREET4, T3FREE, THYROIDAB in the last 72 hours. Anemia Panel: No results for input(s): VITAMINB12, FOLATE, FERRITIN, TIBC, IRON, RETICCTPCT in the last 72 hours. Urine analysis:    Component Value Date/Time   COLORURINE AMBER* 03/24/2016 1244   APPEARANCEUR TURBID* 03/24/2016 1244   LABSPEC 1.031* 03/24/2016 1244   PHURINE 6.0 03/24/2016 1244   GLUCOSEU NEGATIVE 03/24/2016 1244   HGBUR NEGATIVE 03/24/2016 1244   BILIRUBINUR moderate* 04/01/2016 1651   BILIRUBINUR MODERATE* 03/24/2016 1244   BILIRUBINUR negative 05/18/2015 1104   KETONESUR moderate (40)* 04/01/2016 1651   KETONESUR >80* 03/24/2016 1244   PROTEINUR negative 04/01/2016 1651   PROTEINUR 30* 03/24/2016 1244   PROTEINUR trace 05/18/2015 1104   UROBILINOGEN 1.0 04/01/2016 1651   UROBILINOGEN 0.2 04/20/2012 1141   NITRITE Negative 04/01/2016 1651   NITRITE NEGATIVE 03/24/2016 1244   NITRITE negative 05/18/2015 1104   LEUKOCYTESUR Negative 04/01/2016 1651   Sepsis Labs: !!!!!!!!!!!!!!!!!!!!!!!!!!!!!!!!!!!!!!!!!!!! @LABRCNTIP (procalcitonin:4,lacticidven:4) )No results found for this or any previous visit (from the past 240 hour(s)).   Radiological Exams on Admission: No results found.  EKG: none available.   Assessment/Plan Active Problems:   Internal and external  bleeding hemorrhoids   Postoperative nausea and vomiting   S/P laparoscopic sleeve gastrectomy   GI bleed   Hematochezia  1-Acute blood loss anemia;  Unclear  etiology; could be secondary to hemorrhoids bleed, vs has history sleeve gastrectomy, ulceratives colitis.   Patient is symptomatic, report dizziness.  Will transfuse 2 units PRBC.  Continue with Protonix.   2-Hemorrhoids Sitz bath.   3-Abdominal pain , epigastrium, Recent Gastrectomy.  Patient with nausea. Continue with Protonix.  IV fluids. I have informed Dr Lucia Gaskins of patient. Admission.  She is supposed to have an endoscopy on Monday by Dr Lucia Gaskins.    DVT prophylaxis: scd/ Code Status:  Full code.  Family Communication: care discussed with patient.  Disposition Plan:  Home in 24 to 48 hours.  Consults called: surgery  Admission status: observation,Med-surgery    Elmarie Shiley MD [facility] Pager [phone]  If 7PM-7AM, please contact night-coverage [email or web address] Password [email or web address]  04/04/2016, 6:20 PM

## 2016-04-04 NOTE — ED Provider Notes (Signed)
CSN: [id number]     Arrival date & time 04/04/16  1142 History   First MD Initiated Contact with Patient 04/04/16 1250     Chief Complaint  Patient presents with  . Rectal Bleeding  . Emesis     (Consider location/radiation/quality/duration/timing/severity/associated sxs/prior Treatment) Patient is a 40 y.o. female presenting with hematochezia and vomiting. The history is provided by the patient.  Rectal Bleeding Associated symptoms: no abdominal pain, no fever and no vomiting   Emesis Associated symptoms: no abdominal pain, no diarrhea and no headaches   Followed by [facility]. Patient with history of both internal and external hemorrhoids. Patient has had some hemorrhoid problems for the past few days. Head is increased pain anal area starting shortly after midnight. Patient states that she's been having significant rectal bleeding for the past few days with 4-5 bowel movements that are pure blood. Patient has had requirement for transfusions in the past. Related to hemorrhoidal bleeds. Patient denies any abdominal pain. Patient denies any syncopal episodes. Patient's also being worked up by GI medicine for gastric and/or esophageal motility problem.  Past Medical History  Diagnosis Date  . Bronchitis   . Asthma   . Clostridium difficile infection 04/20/2012  . Obesity   . Osteoarthritis     Both knees  . Shortness of breath     on exertion  . Internal and external bleeding hemorrhoids 06/11/2014  . Left sided chronic colitis - segmental 06/11/2014  . TOBACCO USER 10/02/2009    Qualifier: Diagnosis of  By: Dimas Millin MD, Ellard Artis    . Anal fissure - posterior 10/16/2014  . IDA (iron deficiency anemia)   . Ulcerative colitis ([place])   . Motion sickness   . Postoperative nausea and vomiting 01/21/2016  . Hiatal hernia   . Transfusion history     last transfusion 6'16    Past Surgical History  Procedure Laterality Date  . Hemrrhoid surgery    . Colonoscopy  2007    for rectal  bleeding; [facility]  . Laparoscopic tubal ligation  10/16/2011    Procedure: LAPAROSCOPIC TUBAL LIGATION;  Surgeon: Alwyn Pea, MD;  Location: [facility];  Service: Gynecology;  Laterality: Bilateral;  . Foot surgery    . Tubal ligation    . Colonoscopy N/A 06/11/2014    Procedure: COLONOSCOPY;  Surgeon: Gatha Mayer, MD;  Location: [facility];  Service: Endoscopy;  Laterality: N/A;  . Knee arthroscopy Left 09/06/2014  . Novasure ablation  09/28/2010    mild persistent vaginal bleeding  . Dilation and curettage of uterus    . Laparoscopic gastric sleeve resection N/A 01/14/2016    Procedure: LAPAROSCOPIC GASTRIC SLEEVE RESECTION;  Surgeon: Excell Seltzer, MD;  Location: [facility];  Service: General;  Laterality: N/A;   Family History  Problem Relation Age of Onset  . Hypertension Mother   . Diabetes Mother   . Sarcoidosis Mother     lungs and skin  . Asthma Mother   . Hypertension Father   . Diabetes Father   . Asthma Son   . Asthma Sister   . Cancer Sister     possible pancreatic cancer  . Adrenal disorder Sister     Tumor   . Asthma Brother   . Asthma Daughter     died age 53.5  . Cancer Daughter 4    brain; died age 53.5  . Asthma Son    Social History  Substance Use Topics  . Smoking status: Former Smoker -- 0.50  packs/day for 20 years    Types: Cigarettes    Quit date: 03/17/2014  . Smokeless tobacco: Never Used  . Alcohol Use: No   OB History    No data available     Review of Systems  Constitutional: Negative for fever and fatigue.  HENT: Positive for congestion.   Eyes: Negative for visual disturbance.  Respiratory: Negative for shortness of breath.   Cardiovascular: Negative for chest pain.  Gastrointestinal: Positive for hematochezia, anal bleeding and rectal pain. Negative for nausea, vomiting, abdominal pain and diarrhea.  Genitourinary: Negative for dysuria.  Musculoskeletal: Negative for back pain.  Skin: Negative for wound.  Neurological:  Negative for syncope and headaches.  Psychiatric/Behavioral: Negative for confusion.      Allergies  Oxycodone; Ciprofloxacin; Doxycycline; Morphine and related; and Penicillins  Home Medications   Prior to Admission medications   Medication Sig Start Date End Date Taking? Authorizing Provider  albuterol (PROVENTIL HFA;VENTOLIN HFA) 108 (90 Base) MCG/ACT inhaler Inhale 2 puffs into the lungs every 6 (six) hours as needed for wheezing or shortness of breath. For shortness of breath 09/30/15  Yes Wardell Honour, MD  CALCIUM CITRATE PO Take 500 mg by mouth 2 (two) times daily. Reported on 04/01/2016   Yes Historical Provider, MD  cyanocobalamin (,VITAMIN B-12,) 1000 MCG/ML injection Inject 1,000 mcg into the muscle every 30 (thirty) days. Reported on 04/01/2016   Yes Historical Provider, MD  Cyanocobalamin (VITAMIN B-12) 1000 MCG SUBL Place 1,000 mcg under the tongue daily as needed. Only takes when hasn't had shot in 30 days   Yes Historical Provider, MD  diclofenac sodium (VOLTAREN) 1 % GEL Apply 1 application topically 4 (four) times daily as needed (For knee pain.). Reported on 04/01/2016   Yes Historical Provider, MD  EPINEPHrine 0.3 mg/0.3 mL IJ SOAJ injection Inject 0.3 mg into the muscle once as needed (For anaphylaxis.). Reported on 04/01/2016   Yes Historical Provider, MD  fluticasone (FLONASE) 50 MCG/ACT nasal spray Place 1 spray into both nostrils daily as needed for allergies. Reported on 04/01/2016   Yes Historical Provider, MD  Multiple Vitamins-Minerals (MULTIVITAMIN & MINERAL PO) Take 1 tablet by mouth 2 (two) times daily. Reported on 04/01/2016   Yes Historical Provider, MD  ondansetron (ZOFRAN-ODT) 8 MG disintegrating tablet Take 1 tablet (8 mg total) by mouth every 8 (eight) hours as needed for nausea. 03/25/16  Yes Wardell Honour, MD  pantoprazole (PROTONIX) 40 MG tablet Take 40 mg by mouth 2 (two) times daily. Reported on 04/01/2016 01/09/16  Yes Historical Provider, MD   BP 103/57 mmHg   Pulse 63  Temp(Src) 98.5 F (36.9 C) (Oral)  Resp 19  Ht 5' 7"  (1.702 m)  Wt 111.585 kg  BMI 38.52 kg/m2  SpO2 95%  LMP 02/28/2016 Physical Exam  Constitutional: She is oriented to person, place, and time. She appears well-developed and well-nourished. No distress.  HENT:  Head: Normocephalic and atraumatic.  Mouth/Throat: Oropharynx is clear and moist.  Eyes: EOM are normal. Pupils are equal, round, and reactive to light.  Sclera are pale  Neck: Normal range of motion. Neck supple.  Cardiovascular: Normal rate and regular rhythm.   No murmur heard. Pulmonary/Chest: Effort normal and breath sounds normal.  Abdominal: Soft. Bowel sounds are normal. There is no tenderness.  Genitourinary:  Perianal area with evidence of a large external hemorrhoid anterior aspect of that is thrombosed with erosion of the clot through and evidence of some bleeding. Is tender to palpate at  that portion lower portion of the external hemorrhoid is nontender. No evidence of any prolapse hemorrhoids. No evidence of any significant rectal bleeding.  Musculoskeletal: Normal range of motion.  Neurological: She is alert and oriented to person, place, and time. No cranial nerve deficit. She exhibits normal muscle tone.  Skin: Skin is warm.  Nursing note and vitals reviewed.   ED Course  Procedures (including critical care time) Labs Review Labs Reviewed  BASIC METABOLIC PANEL - Abnormal; Notable for the following:    CO2 21 (*)    Calcium 8.6 (*)    All other components within normal limits  CBC WITH DIFFERENTIAL/PLATELET - Abnormal; Notable for the following:    Hemoglobin 8.4 (*)    HCT 27.1 (*)    MCV 69.7 (*)    MCH 21.6 (*)    RDW 19.7 (*)    All other components within normal limits  TYPE AND SCREEN   Results for orders placed or performed during the hospital encounter of 88/11/03  Basic metabolic panel  Result Value Ref Range   Sodium 139 135 - 145 mmol/L   Potassium 3.7 3.5 - 5.1 mmol/L    Chloride 111 101 - 111 mmol/L   CO2 21 (L) 22 - 32 mmol/L   Glucose, Bld 75 65 - 99 mg/dL   BUN 6 6 - 20 mg/dL   Creatinine, Ser 0.70 0.44 - 1.00 mg/dL   Calcium 8.6 (L) 8.9 - 10.3 mg/dL   GFR calc non Af Amer >60 >60 mL/min   GFR calc Af Amer >60 >60 mL/min   Anion gap 7 5 - 15  CBC with Differential/Platelet  Result Value Ref Range   WBC 8.0 4.0 - 10.5 K/uL   RBC 3.89 3.87 - 5.11 MIL/uL   Hemoglobin 8.4 (L) 12.0 - 15.0 g/dL   HCT 27.1 (L) 36.0 - 46.0 %   MCV 69.7 (L) 78.0 - 100.0 fL   MCH 21.6 (L) 26.0 - 34.0 pg   MCHC 31.0 30.0 - 36.0 g/dL   RDW 19.7 (H) 11.5 - 15.5 %   Platelets 255 150 - 400 K/uL   Neutrophils Relative % 56 %   Lymphocytes Relative 35 %   Monocytes Relative 6 %   Eosinophils Relative 3 %   Basophils Relative 0 %   Neutro Abs 4.5 1.7 - 7.7 K/uL   Lymphs Abs 2.8 0.7 - 4.0 K/uL   Monocytes Absolute 0.5 0.1 - 1.0 K/uL   Eosinophils Absolute 0.2 0.0 - 0.7 K/uL   Basophils Absolute 0.0 0.0 - 0.1 K/uL   Smear Review MORPHOLOGY UNREMARKABLE      Imaging Review No results found. I have personally reviewed and evaluated these images and lab results as part of my medical decision-making.   EKG Interpretation None      MDM   Final diagnoses:  Gastrointestinal hemorrhage, unspecified gastritis, unspecified gastrointestinal hemorrhage type  External hemorrhoids    Patient with evidence of significant GI blood loss. Patient had hemoglobin on July 5 above 10. Today it is 8.4. Vital signs are stable no tachycardia no hypotension. Patient relates several day history of red blood in her bowel movements. Large amount. Patient felt as if there was a hemorrhoid there for the past few days. Shortly after midnight turned into pain. Patient contacted her GI medicine doctor which I recommend referral in for evaluation at the emergency department.  Workup showed evidence of a thrombosed external hemorrhoid that the thrombus had eroded through and there was  slight  bleeding but no significant bleeding. No evidence of prolapsed internal hemorrhoids were any active bleeding. I would not recommend lancing the thrombosed hemorrhoid since it is partially eroded through. In visit portion of a larger external hemorrhoid. Patient can be treated symptomatically.  However patient due to the blood loss which most likely not related to this external hemorrhoid will require observation admission and serial hemoglobin and hematocrits. GI medicine contacted for consultation. Hospitalist will admit.    Fredia Sorrow, MD 04/04/16 1700

## 2016-04-05 ENCOUNTER — Encounter (HOSPITAL_COMMUNITY): Payer: Self-pay | Admitting: *Deleted

## 2016-04-05 DIAGNOSIS — K921 Melena: Secondary | ICD-10-CM | POA: Diagnosis not present

## 2016-04-05 DIAGNOSIS — K648 Other hemorrhoids: Secondary | ICD-10-CM | POA: Diagnosis not present

## 2016-04-05 DIAGNOSIS — K644 Residual hemorrhoidal skin tags: Secondary | ICD-10-CM | POA: Diagnosis not present

## 2016-04-05 DIAGNOSIS — Z9884 Bariatric surgery status: Secondary | ICD-10-CM

## 2016-04-05 LAB — COMPREHENSIVE METABOLIC PANEL
ALBUMIN: 3 g/dL — AB (ref 3.5–5.0)
ALK PHOS: 50 U/L (ref 38–126)
ALT: 6 U/L — AB (ref 14–54)
AST: 14 U/L — AB (ref 15–41)
Anion gap: 6 (ref 5–15)
BILIRUBIN TOTAL: 1.4 mg/dL — AB (ref 0.3–1.2)
CALCIUM: 8.5 mg/dL — AB (ref 8.9–10.3)
CO2: 20 mmol/L — ABNORMAL LOW (ref 22–32)
Chloride: 113 mmol/L — ABNORMAL HIGH (ref 101–111)
Creatinine, Ser: 0.67 mg/dL (ref 0.44–1.00)
GFR calc Af Amer: >60 mL/min (ref >60)
GFR calc non Af Amer: >60 mL/min (ref >60)
GLUCOSE: 73 mg/dL (ref 65–99)
Potassium: 3.9 mmol/L (ref 3.5–5.1)
Sodium: 139 mmol/L (ref 135–145)
TOTAL PROTEIN: 6.3 g/dL — AB (ref 6.5–8.1)

## 2016-04-05 LAB — CBC
HEMATOCRIT: 34.8 % — AB (ref 36.0–46.0)
HEMOGLOBIN: 11.1 g/dL — AB (ref 12.0–15.0)
MCH: 24.1 pg — ABNORMAL LOW (ref 26.0–34.0)
MCHC: 31.9 g/dL (ref 30.0–36.0)
MCV: 75.7 fL — ABNORMAL LOW (ref 78.0–100.0)
Platelets: 272 10*3/uL (ref 150–400)
RBC: 4.6 MIL/uL (ref 3.87–5.11)
RDW: 21 % — ABNORMAL HIGH (ref 11.5–15.5)
WBC: 8.9 10*3/uL (ref 4.0–10.5)

## 2016-04-05 LAB — HEMOGLOBIN AND HEMATOCRIT, BLOOD
HCT: 35.1 % — ABNORMAL LOW (ref 36.0–46.0)
Hemoglobin: 11.2 g/dL — ABNORMAL LOW (ref 12.0–15.0)

## 2016-04-05 MED ORDER — ZOLPIDEM TARTRATE 5 MG PO TABS
5.0000 mg | ORAL_TABLET | Freq: Once | ORAL | Status: AC
  Administered 2016-04-05: 5 mg via ORAL
  Filled 2016-04-05: qty 1

## 2016-04-05 MED ORDER — KCL IN DEXTROSE-NACL 20-5-0.45 MEQ/L-%-% IV SOLN
INTRAVENOUS | Status: DC
  Administered 2016-04-05 (×2): via INTRAVENOUS
  Filled 2016-04-05 (×4): qty 1000

## 2016-04-05 MED ORDER — HYDROCODONE-ACETAMINOPHEN 5-325 MG PO TABS
1.0000 | ORAL_TABLET | ORAL | Status: DC | PRN
  Administered 2016-04-05: 1 via ORAL
  Filled 2016-04-05: qty 1

## 2016-04-05 MED ORDER — HYDROCORTISONE ACETATE 25 MG RE SUPP
25.0000 mg | Freq: Three times a day (TID) | RECTAL | Status: DC
  Administered 2016-04-05 – 2016-04-06 (×4): 25 mg via RECTAL
  Filled 2016-04-05 (×5): qty 1

## 2016-04-05 MED ORDER — MORPHINE SULFATE (PF) 2 MG/ML IV SOLN
2.0000 mg | INTRAVENOUS | Status: DC | PRN
  Administered 2016-04-05 – 2016-04-06 (×3): 2 mg via INTRAVENOUS
  Filled 2016-04-05 (×3): qty 1

## 2016-04-05 NOTE — Progress Notes (Signed)
Patient ID: [name], female   DOB: 10-Nov-1975, 40 y.o.   MRN: [id number] [facility] Surgery Progress Note:   * No surgery found *  Subjective: Mental status is clear.  Patient has been in the bathroom on the Sitz bath Objective: Vital signs in last 24 hours: Temp:  [97.4 F (36.3 C)-98.5 F (36.9 C)] 98.2 F (36.8 C) (07/09 0345) Pulse Rate:  [51-95] 62 (07/09 0345) Resp:  [13-19] 16 (07/09 0345) BP: (103-130)/(57-100) 127/72 mmHg (07/09 0345) SpO2:  [93 %-100 %] 100 % (07/09 0345) Weight:  [111.585 kg (246 lb)] 111.585 kg (246 lb) (07/08 1204)  Intake/Output from previous day: 07/08 0701 - 07/09 0700 In: 600 [Blood:600] Out: -  Intake/Output this shift:    Physical Exam: Work of breathing is normal.  HG stable.  Nontender abdome  Lab Results:  Results for orders placed or performed during the hospital encounter of 04/04/16 (from the past 48 hour(s))  Basic metabolic panel     Status: Abnormal   Collection Time: 04/04/16  2:49 PM  Result Value Ref Range   Sodium 139 135 - 145 mmol/L   Potassium 3.7 3.5 - 5.1 mmol/L   Chloride 111 101 - 111 mmol/L   CO2 21 (L) 22 - 32 mmol/L   Glucose, Bld 75 65 - 99 mg/dL   BUN 6 6 - 20 mg/dL   Creatinine, Ser 0.70 0.44 - 1.00 mg/dL   Calcium 8.6 (L) 8.9 - 10.3 mg/dL   GFR calc non Af Amer >60 >60 mL/min   GFR calc Af Amer >60 >60 mL/min    Comment: (NOTE) The eGFR has been calculated using the CKD EPI equation. This calculation has not been validated in all clinical situations. eGFR's persistently <60 mL/min signify possible Chronic Kidney Disease.    Anion gap 7 5 - 15  CBC with Differential/Platelet     Status: Abnormal   Collection Time: 04/04/16  2:49 PM  Result Value Ref Range   WBC 8.0 4.0 - 10.5 K/uL   RBC 3.89 3.87 - 5.11 MIL/uL   Hemoglobin 8.4 (L) 12.0 - 15.0 g/dL   HCT 27.1 (L) 36.0 - 46.0 %   MCV 69.7 (L) 78.0 - 100.0 fL   MCH 21.6 (L) 26.0 - 34.0 pg   MCHC 31.0 30.0 - 36.0 g/dL   RDW 19.7 (H) 11.5 -  15.5 %   Platelets 255 150 - 400 K/uL    Comment: RESULT REPEATED AND VERIFIED SPECIMEN CHECKED FOR CLOTS PLATELET COUNT CONFIRMED BY SMEAR    Neutrophils Relative % 56 %   Lymphocytes Relative 35 %   Monocytes Relative 6 %   Eosinophils Relative 3 %   Basophils Relative 0 %   Neutro Abs 4.5 1.7 - 7.7 K/uL   Lymphs Abs 2.8 0.7 - 4.0 K/uL   Monocytes Absolute 0.5 0.1 - 1.0 K/uL   Eosinophils Absolute 0.2 0.0 - 0.7 K/uL   Basophils Absolute 0.0 0.0 - 0.1 K/uL   Smear Review MORPHOLOGY UNREMARKABLE   Type and screen [place]     Status: None (Preliminary result)   Collection Time: 04/04/16  3:55 PM  Result Value Ref Range   ABO/RH(D) O POS    Antibody Screen NEG    Sample Expiration 04/07/2016    Unit Number [id number]    Blood Component Type RED CELLS,LR    Unit division 00    Status of Unit ISSUED    Transfusion Status OK TO TRANSFUSE  Crossmatch Result Compatible    Unit Number [id number]    Blood Component Type RBC LR PHER1    Unit division 00    Status of Unit ISSUED    Transfusion Status OK TO TRANSFUSE    Crossmatch Result Compatible   Prepare RBC     Status: None   Collection Time: 04/04/16  8:17 PM  Result Value Ref Range   Order Confirmation ORDER PROCESSED BY BLOOD BANK   CBC     Status: Abnormal   Collection Time: 04/05/16  7:45 AM  Result Value Ref Range   WBC 8.9 4.0 - 10.5 K/uL   RBC 4.60 3.87 - 5.11 MIL/uL   Hemoglobin 11.1 (L) 12.0 - 15.0 g/dL    Comment: DELTA CHECK NOTED POST TRANSFUSION SPECIMEN    HCT 34.8 (L) 36.0 - 46.0 %   MCV 75.7 (L) 78.0 - 100.0 fL    Comment: DELTA CHECK NOTED POST TRANSFUSION SPECIMEN    MCH 24.1 (L) 26.0 - 34.0 pg   MCHC 31.9 30.0 - 36.0 g/dL   RDW 21.0 (H) 11.5 - 15.5 %   Platelets 272 150 - 400 K/uL  Comprehensive metabolic panel     Status: Abnormal   Collection Time: 04/05/16  7:45 AM  Result Value Ref Range   Sodium 139 135 - 145 mmol/L   Potassium 3.9 3.5 - 5.1 mmol/L    Chloride 113 (H) 101 - 111 mmol/L   CO2 20 (L) 22 - 32 mmol/L   Glucose, Bld 73 65 - 99 mg/dL   BUN <5 (L) 6 - 20 mg/dL   Creatinine, Ser 0.67 0.44 - 1.00 mg/dL   Calcium 8.5 (L) 8.9 - 10.3 mg/dL   Total Protein 6.3 (L) 6.5 - 8.1 g/dL   Albumin 3.0 (L) 3.5 - 5.0 g/dL   AST 14 (L) 15 - 41 U/L   ALT 6 (L) 14 - 54 U/L   Alkaline Phosphatase 50 38 - 126 U/L   Total Bilirubin 1.4 (H) 0.3 - 1.2 mg/dL   GFR calc non Af Amer >60 >60 mL/min   GFR calc Af Amer >60 >60 mL/min    Comment: (NOTE) The eGFR has been calculated using the CKD EPI equation. This calculation has not been validated in all clinical situations. eGFR's persistently <60 mL/min signify possible Chronic Kidney Disease.    Anion gap 6 5 - 15    Radiology/Results: No results found.  Anti-infectives: Anti-infectives    None      Assessment/Plan: Problem List: Patient Active Problem List   Diagnosis Date Noted  . GI bleed 04/04/2016  . Hematochezia 04/04/2016  . 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 Postoperative nausea and vomiting 01/21/2016  . Morbid obesity with BMI of 45.0-49.9, adult ([place]) 01/14/2016  . Anemia, iron deficiency 10/01/2015  . Skin lesion 05/18/2015  . Anal fissure - posterior 10/16/2014  . Severe obesity (BMI >= 40) ([place]) 10/16/2014  . Left sided chronic colitis - segmental 06/11/2014  . Internal and external bleeding hemorrhoids 06/11/2014  . Asthma 04/26/2012  . Excessive or frequent menstruation 10/16/2011  . DEPRESSION, MILD 10/02/2009  . INTERNAL HEMORRHOIDS 11/15/2007    For upper endoscopy by Dr. Lucia Gaskins tomorrow.   * No surgery found *      Matt B. Hassell Done, MD, [facility] [phone] beeper [phone]  04/05/2016 10:32 AM

## 2016-04-05 NOTE — Progress Notes (Addendum)
PROGRESS NOTE                                                                                                                                                                                                             Patient Demographics:    [name], is a 40 y.o. female, DOB - [birth year], OFB:[id number]  Admit date - 04/04/2016   Admitting Physician Elmarie Shiley, MD  Outpatient Primary MD for the patient is SMITH,KRISTI, MD  LOS -   Chief Complaint  Patient presents with  . Rectal Bleeding  . Emesis       Brief Narrative   [name] is a 40 y.o. 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 She report intermitting blood in the stool for last week. She relates that last night she had significant amount of blood per rectum. She had another episode in the ED. She relates intermittent nausea and vomiting since the gastric surgery and cramping abdominal pain. She is complaining of cramping abdominal pain now and she just vomited. She is also feeling weak, dizzy.   ED Course: Hb drop to 8 from 10, Co 2 at 21, vitals and blood pressure stable.    Subjective:    [name] today has, No headache, No chest pain, No abdominal pain - No Nausea, No new weakness tingling or numbness, No Cough - SOB. Has some rectal pain.   Assessment  & Plan :     1.Rectal bleed due to thrombosed hemorrhoid causing acute blood loss related anemia. Patient received 2 units of packed RBC on admission with stable H&H, general surgery following, she has been ordered to get sitz bath every 6 hours along with Anusol HC suppositories 3 times a day, continue supportive care.  2. History of gastric sleeve procedure done a few months ago, Persistent nausea. General surgery had planned an elective EGD  which is to be done tomorrow, general surgery following no acute issues, patient's BUN is stable, do not think this is upper GI bleed. Continue oral Protonix.  3. Morbid obesity. Follow with PCP.  4. History of asthma. Stable no acute issues, supportive care as needed.  5. History of ulcerative colitis. Follow with Dr. Carlean Purl post discharge.  Family Communication  :  None present  Code Status :  Full  Diet : Clears  Disposition Plan  :  Home after EGD  Consults  :    CCS  Procedures  :    DVT Prophylaxis  :   SCDs    Lab Results  Component Value Date   PLT 272 04/05/2016    Inpatient Medications  Scheduled Meds: . sodium chloride   Intravenous Once  . famotidine (PEPCID) IV  20 mg Intravenous Q12H  . pantoprazole  40 mg Oral BID  . sodium chloride flush  3 mL Intravenous Q12H   Continuous Infusions: . sodium chloride 100 mL/hr at 04/04/16 1554  . sodium chloride 100 mL/hr at 04/04/16 2000   PRN Meds:.acetaminophen **OR** acetaminophen, albuterol, diphenhydrAMINE, fluticasone, HYDROmorphone (DILAUDID) injection, ondansetron **OR** ondansetron (ZOFRAN) IV, ondansetron  Antibiotics  :    Anti-infectives    None         Objective:   Filed Vitals:   04/04/16 2245 04/05/16 0120 04/05/16 0150 04/05/16 0345  BP: 130/70 126/65 123/91 127/72  Pulse: 64 55 51 62  Temp: 97.4 F (36.3 C) 97.7 F (36.5 C) 98.1 F (36.7 C) 98.2 F (36.8 C)  TempSrc: Oral Oral Oral Oral  Resp: 16 16 16 16   Height:      Weight:      SpO2: 100% 100% 100% 100%    Wt Readings from Last 3 Encounters:  04/04/16 111.585 kg (246 lb)  04/01/16 111.585 kg (246 lb)  04/01/16 112.583 kg (248 lb 3.2 oz)     Intake/Output Summary (Last 24 hours) at 04/05/16 0909 Last data filed at 04/05/16 0345  Gross per 24 hour  Intake    600 ml  Output      0 ml  Net    600 ml     Physical Exam  Awake Alert, Oriented X 3, No new F.N deficits, Normal affect [place].AT,PERRAL Supple Neck,No  JVD, No cervical lymphadenopathy appriciated.  Symmetrical Chest wall movement, Good air movement bilaterally, CTAB RRR,No Gallops,Rubs or new Murmurs, No Parasternal Heave +ve B.Sounds, Abd Soft, No tenderness, No organomegaly appriciated, No rebound - guarding or rigidity. No Cyanosis, Clubbing or edema, No new Rash or bruise       Data Review:    CBC  Recent Labs Lab 04/01/16 1651 04/04/16 1449 04/05/16 0745  WBC 10.7* 8.0 8.9  HGB 10.3* 8.4* 11.1*  HCT 32.3* 27.1* 34.8*  PLT  --  255 272  MCV 69.7* 69.7* 75.7*  MCH 22.3* 21.6* 24.1*  MCHC 32.0 31.0 31.9  RDW  --  19.7* 21.0*  LYMPHSABS  --  2.8  --   MONOABS  --  0.5  --   EOSABS  --  0.2  --   BASOSABS  --  0.0  --     Chemistries   Recent Labs Lab 04/01/16 1639 04/04/16 1449 04/05/16 0745  NA 140 139 139  K 3.3* 3.7 3.9  CL 106 111 113*  CO2 23 21* 20*  GLUCOSE 76 75 73  BUN 7 6 <5*  CREATININE 0.73 0.70 0.67  CALCIUM 9.1 8.6* 8.5*  AST 10  --  14*  ALT 6  --  6*  ALKPHOS 63  --  50  BILITOT 0.5  --  1.4*   ------------------------------------------------------------------------------------------------------------------ No results for input(s): CHOL, HDL, LDLCALC, TRIG, CHOLHDL, LDLDIRECT in the last 72 hours.  Lab Results  Component Value Date   HGBA1C 5.8* 11/05/2015   ------------------------------------------------------------------------------------------------------------------  No results for input(s): TSH, T4TOTAL, T3FREE, THYROIDAB in the last 72 hours.  Invalid input(s): FREET3 ------------------------------------------------------------------------------------------------------------------ No results for input(s): VITAMINB12, FOLATE, FERRITIN, TIBC, IRON, RETICCTPCT in the last 72 hours.  Coagulation profile No results for input(s): INR, PROTIME in the last 168 hours.  No results for input(s): DDIMER in the last 72 hours.  Cardiac Enzymes No results for input(s): CKMB, TROPONINI,  MYOGLOBIN in the last 168 hours.  Invalid input(s): CK ------------------------------------------------------------------------------------------------------------------ No results found for: BNP  Micro Results No results found for this or any previous visit (from the past 240 hour(s)).  Radiology Reports Ct Head Wo Contrast  03/24/2016  CLINICAL DATA:  Dizziness and nausea following fall earlier today ; neck pain EXAM: CT HEAD WITHOUT CONTRAST CT CERVICAL SPINE WITHOUT CONTRAST TECHNIQUE: Multidetector CT imaging of the head and cervical spine was performed following the standard protocol without intravenous contrast. Multiplanar CT image reconstructions of the cervical spine were also generated. COMPARISON:  Head CT March 12, 2015 FINDINGS: CT HEAD FINDINGS The ventricles are normal in size and configuration. There is mild invagination of CSF into the sella. There is no intracranial mass, hemorrhage, extra-axial fluid collection, or midline shift. Gray-white compartments are normal. No acute infarct evident. No vascular lesions are evident. The bony calvarium appears intact. 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 Mastoid air cells are clear. Visualized orbits appear symmetric bilaterally. Visualized paranasal sinuses are clear. CT CERVICAL SPINE FINDINGS There is no fracture or spondylolisthesis. Prevertebral soft tissues and predental space regions are normal. The disc spaces appear normal. There is a small focus of calcification in the anterior ligament at C5-6. There is no nerve root edema or effacement. No disc extrusion or stenosis. IMPRESSION: CT head: There is mild invagination of CSF into the sella. Ventricles are normal in size and configuration. No intracranial mass, hemorrhage, or extra-axial fluid collection. Gray-white compartments appear normal. CT cervical spine: Small focus of calcification in the anterior ligament at C5-6. No appreciable disc space narrowing. No disc  extrusion or stenosis. No nerve root edema or effacement. No fracture or spondylolisthesis. Electronically Signed   By: Lowella Grip III M.D.   On: 03/24/2016 11:53   Ct Cervical Spine Wo Contrast  03/24/2016  CLINICAL DATA:  Dizziness and nausea following fall earlier today ; neck pain EXAM: CT HEAD WITHOUT CONTRAST CT CERVICAL SPINE WITHOUT CONTRAST TECHNIQUE: Multidetector CT imaging of the head and cervical spine was performed following the standard protocol without intravenous contrast. Multiplanar CT image reconstructions of the cervical spine were also generated. COMPARISON:  Head CT March 12, 2015 FINDINGS: CT HEAD FINDINGS The ventricles are normal in size and configuration. There is mild invagination of CSF into the sella. There is no intracranial mass, hemorrhage, extra-axial fluid collection, or midline shift. Gray-white compartments are normal. No acute infarct evident. No vascular lesions are evident. The bony calvarium appears intact. 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 Mastoid air cells are clear. Visualized orbits appear symmetric bilaterally. Visualized paranasal sinuses are clear. CT CERVICAL SPINE FINDINGS There is no fracture or spondylolisthesis. Prevertebral soft tissues and predental space regions are normal. The disc spaces appear normal. There is a small focus of calcification in the anterior ligament at C5-6. There is no nerve root edema or effacement. No disc extrusion or stenosis. IMPRESSION: CT head: There is mild invagination of CSF into the sella. Ventricles are normal in size and configuration. No intracranial mass, hemorrhage, or extra-axial fluid collection.  Gray-white compartments appear normal. CT cervical spine: Small focus of calcification in the anterior ligament at C5-6. No appreciable disc space narrowing. No disc extrusion or stenosis. No nerve root edema or effacement. No fracture or spondylolisthesis. Electronically Signed   By: Lowella Grip III M.D.   On: 03/24/2016 11:53   Dg Duanne Limerick  W/kub  03/16/2016  CLINICAL DATA:  Status post gastric sleeve procedure. Patient with nausea after eating. EXAM: UPPER GI SERIES WITH KUB TECHNIQUE: After obtaining a scout radiograph a routine upper GI series was performed using thin and high density barium. FLUOROSCOPY TIME:  Radiation Exposure Index (as provided by the fluoroscopic device): If the device does not provide the exposure index: Fluoroscopy Time (in minutes and seconds): 4 minutes and 48 seconds. Number of Acquired Images: COMPARISON:  CT scan 01/22/2016. Upper GI studies from 11/07/2015 and 01/15/2016. FINDINGS: Pre-procedure KUB shows a normal bowel gas pattern. Surgical clips are seen in the region of the esophagogastric junction. Frontal and lateral views of the hypopharynx while swallowing are normal. Esophageal motility is normal. After several swallows of barium to assess motility, the patient became uncomfortable with abdominal discomfort and nausea. However, no vomiting during the study. A the small sliding-type hiatal hernia is identified. There was no evidence of hernia on the CT scan of 01/22/2016, suggesting interval development. Stomach has otherwise expected postoperative appearance for laparoscopic sleeve gastrectomy. Gastric emptying is prompt through a normal pylorus. Duodenum bulb is normal. The duodenum C-loop is normally positioned and nondilated. Ligament of Treitz is in the expected anatomic location. Despite normal appearance of the esophagus during the exam, when a 13 mm barium tablet was given, it lodged in the proximal thoracic esophagus, at about the level of the carina. The tablet remained at this position despite multiple repeated swallows of water and barium. All approximately the eighth or ninth swallow, the tablet did pass the esophagus into the stomach. IMPRESSION: 1. Small hiatal hernia on today's study was not visible on previous CT of 01/22/2016. 2. Normal  esophageal motility. Barium tablet becomes lodged in the proximal thoracic esophagus with no underlying focal stricture or lesion evident. After multiple swallows of water and thin barium, the tablet did subsequently pass the esophagus and into the stomach. 3. Expected postoperative appearance of the stomach with brisk emptying through a normal pylorus. 4. Normal duodenum. Electronically Signed   By: Misty Stanley M.D.   On: 03/16/2016 11:18    Time Spent in minutes  30   SINGH,PRASHANT K M.D on 04/05/2016 at 9:09 AM  Between 7am to 7pm - Pager - [phone]  After 7pm go to [email or web address] - password [facility]  [facility] -  Office  [phone]

## 2016-04-05 NOTE — Progress Notes (Signed)
Chaplain visit the result of a [place] in her chart. Ms Verner has had thoughts of "ending it all." When asked about these thoughts she indicates she would never kill herself, or harm herself, these are just thoughts that flee through her mind as she adjusts to not being able to eat, vomiting all she tries to eat, and being hungry. She has lost a great deal of weight in the last few years and acknowledges that she has tried to tone her body, and keep the weight off. There may be some connection with her not feeling she can eat and a fear of putting on weight. At present, because of her knee injuries, she is unable to work and this is causing financial problems for her and her children. This too is emotionally straining. She reports that she cannot sleep through the night that she sleeps an hour or two and then is awake. This lack of sleep is effecting her ability to rationally goal set for herself or her children. It is my observation that she is not suicidal, does not wish to harm herself or others, but is depressed because all of the solutions for her returning to being able to eat and hold down her food have had road blocks. There are so many "nos" and too few "yeses" to what will return her to a time when she can eat, work out, and live "normal." As a single parent she states she is trying hard to keep her family together. Her husband died in a traffic accident and she has struggled to meet her families's needs. This strain on her emotions may have some secondary affects on her current physical well being.   Ms Zia is a very respective patient, easy to talk with and open to care giver questions. Further spiritual care visits would be of great benefit to Ms Jupiter.  The spiritual care consult in her chart has been marked completed based on this initial visit, more care and support is indicated.  Sallee Lange. Taytum Wheller, DMin, MDiv, MA Chaplain.

## 2016-04-06 ENCOUNTER — Observation Stay (HOSPITAL_COMMUNITY): Payer: 59 | Admitting: Anesthesiology

## 2016-04-06 ENCOUNTER — Encounter (HOSPITAL_COMMUNITY)
Admission: EM | Disposition: A | Discharge status: Home / Self Care | Payer: Self-pay | Source: Home / Self Care | Attending: Emergency Medicine

## 2016-04-06 ENCOUNTER — Ambulatory Visit (HOSPITAL_COMMUNITY): Admission: RE | Admit: 2016-04-06 | Payer: 59 | Source: Ambulatory Visit | Admitting: Surgery

## 2016-04-06 ENCOUNTER — Encounter (HOSPITAL_COMMUNITY): Payer: Self-pay | Admitting: *Deleted

## 2016-04-06 DIAGNOSIS — K644 Residual hemorrhoidal skin tags: Secondary | ICD-10-CM | POA: Diagnosis not present

## 2016-04-06 DIAGNOSIS — K921 Melena: Secondary | ICD-10-CM | POA: Diagnosis not present

## 2016-04-06 DIAGNOSIS — Z9884 Bariatric surgery status: Secondary | ICD-10-CM | POA: Diagnosis not present

## 2016-04-06 DIAGNOSIS — K648 Other hemorrhoids: Secondary | ICD-10-CM | POA: Diagnosis not present

## 2016-04-06 HISTORY — PX: ESOPHAGOGASTRODUODENOSCOPY (EGD) WITH PROPOFOL: SHX5813

## 2016-04-06 HISTORY — DX: Personal history of other medical treatment: Z92.89

## 2016-04-06 LAB — CBC
HEMATOCRIT: 33.5 % — AB (ref 36.0–46.0)
Hemoglobin: 10.7 g/dL — ABNORMAL LOW (ref 12.0–15.0)
MCH: 23.8 pg — AB (ref 26.0–34.0)
MCHC: 31.9 g/dL (ref 30.0–36.0)
MCV: 74.6 fL — AB (ref 78.0–100.0)
PLATELETS: 281 10*3/uL (ref 150–400)
RBC: 4.49 MIL/uL (ref 3.87–5.11)
RDW: 21.1 % — AB (ref 11.5–15.5)
WBC: 7.4 10*3/uL (ref 4.0–10.5)

## 2016-04-06 LAB — TYPE AND SCREEN
ABO/RH(D): O POS
Antibody Screen: NEGATIVE
Unit division: 0
Unit division: 0

## 2016-04-06 SURGERY — ESOPHAGOGASTRODUODENOSCOPY (EGD) WITH PROPOFOL
Anesthesia: Monitor Anesthesia Care

## 2016-04-06 MED ORDER — FENTANYL CITRATE (PF) 100 MCG/2ML IJ SOLN: 25.0000 ug | INTRAMUSCULAR | Status: DC | PRN

## 2016-04-06 MED ORDER — PROPOFOL 500 MG/50ML IV EMUL
INTRAVENOUS | Status: DC | PRN
  Administered 2016-04-06: 125 ug/kg/min via INTRAVENOUS

## 2016-04-06 MED ORDER — LIDOCAINE HCL (CARDIAC) 20 MG/ML IV SOLN
INTRAVENOUS | Status: DC | PRN
  Administered 2016-04-06: 50 mg via INTRAVENOUS

## 2016-04-06 MED ORDER — HYDROCORTISONE ACETATE 25 MG RE SUPP: 25.0000 mg | Freq: Three times a day (TID) | RECTAL | Status: DC

## 2016-04-06 MED ORDER — LACTATED RINGERS IV SOLN
INTRAVENOUS | Status: DC | PRN
  Administered 2016-04-06: 13:00:00 via INTRAVENOUS

## 2016-04-06 MED ORDER — ONDANSETRON HCL 4 MG/2ML IJ SOLN
INTRAMUSCULAR | Status: AC
Start: 2016-04-06 — End: 2016-04-06
  Filled 2016-04-06: qty 2

## 2016-04-06 MED ORDER — KCL IN DEXTROSE-NACL 20-5-0.45 MEQ/L-%-% IV SOLN
INTRAVENOUS | Status: DC
  Administered 2016-04-06: 08:00:00 via INTRAVENOUS
  Filled 2016-04-06 (×2): qty 1000

## 2016-04-06 MED ORDER — METOCLOPRAMIDE HCL 5 MG/ML IJ SOLN
INTRAMUSCULAR | Status: DC | PRN
  Administered 2016-04-06: 10 mg via INTRAVENOUS

## 2016-04-06 MED ORDER — FENTANYL CITRATE (PF) 100 MCG/2ML IJ SOLN: 25.0000 ug | Freq: Once | INTRAMUSCULAR | Status: DC

## 2016-04-06 MED ORDER — PROPOFOL 10 MG/ML IV BOLUS
INTRAVENOUS | Status: AC
  Filled 2016-04-06: qty 40

## 2016-04-06 MED ORDER — METOCLOPRAMIDE HCL 5 MG/ML IJ SOLN
INTRAMUSCULAR | Status: AC
  Filled 2016-04-06: qty 2

## 2016-04-06 MED ORDER — HYDROCODONE-ACETAMINOPHEN 5-325 MG PO TABS: 1.0000 | ORAL_TABLET | Freq: Four times a day (QID) | ORAL | Status: DC | PRN

## 2016-04-06 MED ORDER — PROPOFOL 10 MG/ML IV BOLUS
INTRAVENOUS | Status: DC | PRN
  Administered 2016-04-06: 40 mg via INTRAVENOUS

## 2016-04-06 MED ORDER — LIDOCAINE HCL (CARDIAC) 20 MG/ML IV SOLN
INTRAVENOUS | Status: AC
  Filled 2016-04-06: qty 5

## 2016-04-06 SURGICAL SUPPLY — 15 items

## 2016-04-06 NOTE — Care Management Note (Signed)
Case Management Note  Patient Details  Name: [name] MRN: [id number] Date of Birth: 02/08/1976  Subjective/Objective:   40 y/o f admitted w/GIB. From home.                 Action/Plan:d/c home no needs or orders.   Expected Discharge Date:                  Expected Discharge Plan:  Home/Self Care  In-House Referral:     Discharge planning Services  CM Consult  Post Acute Care Choice:    Choice offered to:     DME Arranged:    DME Agency:     HH Arranged:    [place] Agency:     Status of Service:  Completed, signed off  If discussed at [employer] of Stay Meetings, dates discussed:    Additional Comments:  Dessa Phi, RN 04/06/2016, 3:49 PM

## 2016-04-06 NOTE — Progress Notes (Signed)
  Subjective: Still has some hemorrhoid pain but less.  Minimal bleeding. Still has some nausea but doing better  Plans for upper endoscopy by Dr. Alphonsa Overall today Wants to go home after endoscopy, which seems reasonable Objective: Vital signs in last 24 hours: Temp:  [98.1 F (36.7 C)-98.7 F (37.1 C)] 98.2 F (36.8 C) (07/10 0412) Pulse Rate:  [56-60] 56 (07/10 0412) Resp:  [16-18] 18 (07/10 0412) BP: (108-122)/(54-75) 118/70 mmHg (07/10 0412) SpO2:  [96 %-100 %] 96 % (07/10 0412) Last BM Date: 04/04/16  Intake/Output from previous day: 07/09 0701 - 07/10 0700 In: 2560 [P.O.:600; I.V.:1960] Out: -  Intake/Output this shift:    General appearance: Alert.  Stable.  Does not appear to be in any distress.  Appropriate and cooperative.  Obese. GI: soft, non-tender; bowel sounds normal; no masses,  no organomegaly Rectal exam.  One large and one small thrombosed hemorrhoid.  No skin necrosis.  No active bleeding.  No cellulitis  Lab Results:   Recent Labs  04/05/16 0745 04/05/16 1510 04/06/16 0434  WBC 8.9  --  7.4  HGB 11.1* 11.2* 10.7*  HCT 34.8* 35.1* 33.5*  PLT 272  --  281   BMET  Recent Labs  04/04/16 1449 04/05/16 0745  NA 139 139  K 3.7 3.9  CL 111 113*  CO2 21* 20*  GLUCOSE 75 73  BUN 6 <5*  CREATININE 0.70 0.67  CALCIUM 8.6* 8.5*   PT/INR No results for input(s): LABPROT, INR in the last 72 hours. ABG No results for input(s): PHART, HCO3 in the last 72 hours.  Invalid input(s): PCO2, PO2  Studies/Results: No results found.  Anti-infectives: Anti-infectives    None      Assessment/Plan: s/p Procedure(s): ESOPHAGOGASTRODUODENOSCOPY (EGD) WITH PROPOFOL  Status post laparoscopic sleeve gastrectomy with postoperative nausea and vomiting.  For upper endoscopy today  External thrombosed hemorrhoids.  The should be self-limited and will hurt for about a week and then subside. She can follow-up in our office regarding her hemorrhoids  with Dr. Excell Seltzer, or if she desires, Dr. Leighton Ruff, our colorectal surgeon  She may be discharged today if everything else is stable.      Adin Hector 04/06/2016

## 2016-04-06 NOTE — Anesthesia Preprocedure Evaluation (Signed)
Anesthesia Evaluation  Patient identified by MRN, date of birth, ID band Patient awake    Reviewed: Allergy & Precautions, NPO status , Patient's Chart, lab work & pertinent test results  History of Anesthesia Complications (+) PONV and history of anesthetic complications  Airway Mallampati: II  TM Distance: >3 FB Neck ROM: Full    Dental  (+) Dental Advisory Given, Chipped, Missing,    Pulmonary asthma , former smoker,    Pulmonary exam normal breath sounds clear to auscultation       Cardiovascular negative cardio ROS Normal cardiovascular exam Rhythm:Regular Rate:Normal     Neuro/Psych PSYCHIATRIC DISORDERS Depression negative neurological ROS     GI/Hepatic Neg liver ROS, hiatal hernia, PUD, UC   Endo/Other  Obesity   Renal/GU negative Renal ROS     Musculoskeletal  (+) Arthritis , Osteoarthritis,    Abdominal   Peds  Hematology  (+) Blood dyscrasia, anemia ,   Anesthesia Other Findings Day of surgery medications reviewed with the patient.  Reproductive/Obstetrics                             Anesthesia Physical Anesthesia Plan  ASA: II  Anesthesia Plan: MAC   Post-op Pain Management:    Induction: Intravenous  Airway Management Planned: Nasal Cannula  Additional Equipment:   Intra-op Plan:   Post-operative Plan:   Informed Consent: I have reviewed the patients History and Physical, chart, labs and discussed the procedure including the risks, benefits and alternatives for the proposed anesthesia with the patient or authorized representative who has indicated his/her understanding and acceptance.   Dental advisory given  Plan Discussed with: CRNA and Anesthesiologist  Anesthesia Plan Comments: (Discussed risks/benefits/alternatives to MAC sedation including need for ventilatory support, hypotension, need for conversion to general anesthesia.  All patient questions  answered.  Patient/guardian wishes to proceed.)        Anesthesia Quick Evaluation

## 2016-04-06 NOTE — Transfer of Care (Signed)
Immediate Anesthesia Transfer of Care Note  Patient: [name]  Procedure(s) Performed: Procedure(s): ESOPHAGOGASTRODUODENOSCOPY (EGD) WITH PROPOFOL (N/A)  Patient Location: PACU  Anesthesia Type:MAC  Level of Consciousness:  sedated, patient cooperative and responds to stimulation  Airway & Oxygen Therapy:Patient Spontanous Breathing and Patient connected to face mask oxgen  Post-op Assessment:  Report given to PACU RN and Post -op Vital signs reviewed and stable  Post vital signs:  Reviewed and stable  Last Vitals:  Filed Vitals:   04/06/16 0412 04/06/16 1245  BP: 118/70 139/83  Pulse: 56 54  Temp: 36.8 C 37.1 C  Resp: 18 16    Complications: No apparent anesthesia complications

## 2016-04-06 NOTE — Discharge Instructions (Signed)
Follow with Primary MD SMITH,KRISTI, MD in 7 days , Sitz bath 3 times per day for 1 week, then as needed.  Get CBC, CMP, 2 view Chest X ray checked  by Primary MD or SNF MD in 5-7 days ( we routinely change or add medications that can affect your baseline labs and fluid status, therefore we recommend that you get the mentioned basic workup next visit with your PCP, your PCP may decide not to get them or add new tests based on their clinical decision)   Activity: As tolerated with Full fall precautions use walker/cane & assistance as needed   Disposition Home     Diet:   Heart Healthy  .  For Heart failure patients - Check your Weight same time everyday, if you gain over 2 pounds, or you develop in leg swelling, experience more shortness of breath or chest pain, call your Primary MD immediately. Follow Cardiac Low Salt Diet and 1.5 lit/day fluid restriction.   On your next visit with your primary care physician please Get Medicines reviewed and adjusted.   Please request your Prim.MD to go over all Hospital Tests and Procedure/Radiological results at the follow up, please get all Hospital records sent to your Prim MD by signing hospital release before you go home.   If you experience worsening of your admission symptoms, develop shortness of breath, life threatening emergency, suicidal or homicidal thoughts you must seek medical attention immediately by calling 911 or calling your MD immediately  if symptoms less severe.  You Must read complete instructions/literature along with all the possible adverse reactions/side effects for all the Medicines you take and that have been prescribed to you. Take any new Medicines after you have completely understood and accpet all the possible adverse reactions/side effects.   Do not drive, operate heavy machinery, perform activities at heights, swimming or participation in water activities or provide baby sitting services if your were admitted for  syncope or siezures until you have seen by Primary MD or a Neurologist and advised to do so again.  Do not drive when taking Pain medications.    Do not take more than prescribed Pain, Sleep and Anxiety Medications  Special Instructions: If you have smoked or chewed Tobacco  in the last 2 yrs please stop smoking, stop any regular Alcohol  and or any Recreational drug use.  Wear Seat belts while driving.   Please note  You were cared for by a hospitalist during your hospital stay. If you have any questions about your discharge medications or the care you received while you were in the hospital after you are discharged, you can call the unit and asked to speak with the hospitalist on call if the hospitalist that took care of you is not available. Once you are discharged, your primary care physician will handle any further medical issues. Please note that NO REFILLS for any discharge medications will be authorized once you are discharged, as it is imperative that you return to your primary care physician (or establish a relationship with a primary care physician if you do not have one) for your aftercare needs so that they can reassess your need for medications and monitor your lab values.

## 2016-04-06 NOTE — Op Note (Signed)
04/04/2016 - 04/06/2016  1:53 PM  PATIENT:  [name], 40 y.o., female, MRN: [id number]  PREOP DIAGNOSIS:  Sleeve gastrectomy, persistent nausea and vomiting (initial weight - 295, BMI - 46.3)  POSTOP DIAGNOSIS:   Sleeve gastrectomy, possible angulation mid stomach, no stenosis  [photos in chart]  PROCEDURE:  Esophagogastroduodenoscopy  SURGEON:   Alphonsa Overall, M.D.  ANESTHESIA:   Propofol by anesthesia  INDICATIONS FOR PROCEDURE:  [name] is a 40 y.o. (DOB: January 16, 1976)  AA  female whose primary care physician is SMITH,KRISTI, MD and comes for upper endoscopy to evaluate a sleeve gastrectomy.  The patient had a gastric sleeve on 01/24/2016 by Dr. Excell Seltzer.  She has had persistent nausea and vomiting, with almost anything she eats.   The upper endo is to evaluate her sleeve gastrectotmy.   The indications and risks of the endoscopy were explained to the patient.  The risks include, but are not limited to, perforation, bleeding, or injury to the bowel.  If balloon dilatation is needed, the risk of perforation is higher.  PROCEDURE:  The patient was in room 1 at [facility] endoscopy unit.  The patient was monitored with a pulse oximetry, BP cuff, and EKG.  The patient has nasal O2 flowing during the procedure.  The patient's anesthesia was supervised by anesthesia.     The patient was positioned in the left lateral decubitus position.   A flexible Pentax endoscope was passed down the throat without difficulty.   Findings include:   Esophagus:   Normal, no evidence of stricture or narrowwing   GE junction at:  36 cm   Stomach pouch: "Angulation" at 43 cm.  The stomach may be mildly dilated proximal to the "angulation". No ulcer or obvious mass in the stomach lumen.  The lumen is widely patent I can easily work around the "angulation".   Distal staple line of sleeve:   I really could not see the staple line   Pylorus distance:  50 cm, widely open.   Duodenum:  No ulcer, normal  anatomy.   CLO test:  Done, results pending.  PLAN:   Photos taken and given to patient.    She was admitted for bleeding from a  Hemorrhoid - she can go home and get follow up with Dr. Excell Seltzer.   Alphonsa Overall, MD, [facility] Surgery Pager: [phone] Office phone:  [phone]

## 2016-04-06 NOTE — Anesthesia Postprocedure Evaluation (Signed)
Anesthesia Post Note  Patient: [name]  Procedure(s) Performed: Procedure(s) (LRB): ESOPHAGOGASTRODUODENOSCOPY (EGD) WITH PROPOFOL (N/A)  Patient location during evaluation: Endoscopy Anesthesia Type: MAC Level of consciousness: awake and alert Pain management: pain level controlled Vital Signs Assessment: post-procedure vital signs reviewed and stable Respiratory status: spontaneous breathing, nonlabored ventilation, respiratory function stable and patient connected to nasal cannula oxygen Cardiovascular status: stable and blood pressure returned to baseline Anesthetic complications: no    Last Vitals:  Filed Vitals:   04/06/16 1410 04/06/16 1420  BP: 125/77   Pulse: 57 49  Temp:    Resp: 12 14    Last Pain:  Filed Vitals:   04/06/16 1420  PainSc: 2                  Catalina Gravel

## 2016-04-06 NOTE — Discharge Summary (Signed)
[name] EVO:[id number] DOB: 11/17/1975 DOA: 04/04/2016  PCP: Reginia Forts, MD  Admit date: 04/04/2016  Discharge date: 04/06/2016  Admitted From: Home   Disposition:  Home   Recommendations for Outpatient Follow-up:   Follow up with PCP in 1-2 weeks  PCP Please obtain BMP/CBC, 2 view CXR in 1week,  (see Discharge instructions)   PCP Please follow up on the following pending results: CLO test done during EGD   Home Health: None   Equipment/Devices: None  Discharge Condition: Stable    CODE STATUS: Full   Diet Recommendation: Heart Healthy Consultations: CCS  Chief Complaint  Patient presents with  . Rectal Bleeding  . Emesis     Brief history of present illness from the day of admission and additional interim summary     [name] is a 40 y.o. female with medical history significant of S/P laparoscopic sleeve gastrectomy by Dr Excell Seltzer [phone] who presents today with rectal bleeding and rectal pain that worse overnight. She report intermitting blood in the stool for last week. She relates that last night she had significant amount of blood per rectum. She had another episode in the ED. She relates intermittent nausea and vomiting since the gastric surgery and cramping abdominal pain. She is complaining of cramping abdominal pain now and she just vomited. She is also feeling weak, dizzy.   ED Course: Hb drop to 8 from 10, Co 2 at 21, vitals and blood pressure stable.    Hospital issues addressed    1.Rectal bleed due to thrombosed hemorrhoid causing acute blood loss related anemia. Patient received 2 units of packed RBC on admission with stable H&H, general surgery following, she is much better with Sitz bath along with Anusol HC suppositories 3 times a day, continue upon DC, follow with GI  and CCS post DC.  2. History of gastric sleeve procedure done a few months ago with Persistent nausea. General surgery did an planned elective EGD today which was unremarkable, DW Dr Chauncy Passy post EGD cleared for home DC on PPI with CCS follow up.   3. Morbid obesity. Follow with PCP.  4. History of asthma. Stable no acute issues, supportive care as needed.  5. History of ulcerative colitis. Follow with Dr. Carlean Purl post discharge.   Discharge diagnosis     Active Problems:   Internal and external bleeding hemorrhoids   Postoperative nausea and vomiting   S/P laparoscopic sleeve gastrectomy   GI bleed   Hematochezia    Discharge instructions    Discharge Instructions    Discharge instructions    Complete by:  As directed   Follow with Primary MD SMITH,KRISTI, MD in 7 days , Sitz bath 3 times per day for 1 week, then as needed.  Get CBC, CMP, 2 view Chest X ray checked  by Primary MD or SNF MD in 5-7 days ( we routinely change or add medications that can affect your baseline labs and fluid status, therefore we recommend that you get the mentioned basic workup next visit  with your PCP, your PCP may decide not to get them or add new tests based on their clinical decision)   Activity: As tolerated with Full fall precautions use walker/cane & assistance as needed   Disposition Home     Diet:   Heart Healthy  .  For Heart failure patients - Check your Weight same time everyday, if you gain over 2 pounds, or you develop in leg swelling, experience more shortness of breath or chest pain, call your Primary MD immediately. Follow Cardiac Low Salt Diet and 1.5 lit/day fluid restriction.   On your next visit with your primary care physician please Get Medicines reviewed and adjusted.   Please request your Prim.MD to go over all Hospital Tests and Procedure/Radiological results at the follow up, please get all Hospital records sent to your Prim MD by signing hospital release before you  go home.   If you experience worsening of your admission symptoms, develop shortness of breath, life threatening emergency, suicidal or homicidal thoughts you must seek medical attention immediately by calling 911 or calling your MD immediately  if symptoms less severe.  You Must read complete instructions/literature along with all the possible adverse reactions/side effects for all the Medicines you take and that have been prescribed to you. Take any new Medicines after you have completely understood and accpet all the possible adverse reactions/side effects.   Do not drive, operate heavy machinery, perform activities at heights, swimming or participation in water activities or provide baby sitting services if your were admitted for syncope or siezures until you have seen by Primary MD or a Neurologist and advised to do so again.  Do not drive when taking Pain medications.    Do not take more than prescribed Pain, Sleep and Anxiety Medications  Special Instructions: If you have smoked or chewed Tobacco  in the last 2 yrs please stop smoking, stop any regular Alcohol  and or any Recreational drug use.  Wear Seat belts while driving.   Please note  You were cared for by a hospitalist during your hospital stay. If you have any questions about your discharge medications or the care you received while you were in the hospital after you are discharged, you can call the unit and asked to speak with the hospitalist on call if the hospitalist that took care of you is not available. Once you are discharged, your primary care physician will handle any further medical issues. Please note that NO REFILLS for any discharge medications will be authorized once you are discharged, as it is imperative that you return to your primary care physician (or establish a relationship with a primary care physician if you do not have one) for your aftercare needs so that they can reassess your need for medications and  monitor your lab values.     Increase activity slowly    Complete by:  As directed            Discharge Medications     Medication List    TAKE these medications        albuterol 108 (90 Base) MCG/ACT inhaler  Commonly known as:  PROVENTIL HFA;VENTOLIN HFA  Inhale 2 puffs into the lungs every 6 (six) hours as needed for wheezing or shortness of breath. For shortness of breath     CALCIUM CITRATE PO  Take 500 mg by mouth 2 (two) times daily. Reported on 04/01/2016     cyanocobalamin 1000 MCG/ML injection  Commonly known as:  (VITAMIN  B-12)  Inject 1,000 mcg into the muscle every 30 (thirty) days. Reported on 04/01/2016     Vitamin B-12 1000 MCG Subl  Place 1,000 mcg under the tongue daily as needed. Only takes when hasn't had shot in 30 days     diclofenac sodium 1 % Gel  Commonly known as:  VOLTAREN  Apply 1 application topically 4 (four) times daily as needed (For knee pain.). Reported on 04/01/2016     EPINEPHrine 0.3 mg/0.3 mL Soaj injection  Commonly known as:  EPI-PEN  Inject 0.3 mg into the muscle once as needed (For anaphylaxis.). Reported on 04/01/2016     fluticasone 50 MCG/ACT nasal spray  Commonly known as:  FLONASE  Place 1 spray into both nostrils daily as needed for allergies. Reported on 04/01/2016     HYDROcodone-acetaminophen 5-325 MG tablet  Commonly known as:  NORCO/VICODIN  Take 1 tablet by mouth every 6 (six) hours as needed for moderate pain.     hydrocortisone 25 MG suppository  Commonly known as:  ANUSOL-HC  Place 1 suppository (25 mg total) rectally 3 (three) times daily.     MULTIVITAMIN & MINERAL PO  Take 1 tablet by mouth 2 (two) times daily. Reported on 04/01/2016     ondansetron 8 MG disintegrating tablet  Commonly known as:  ZOFRAN-ODT  Take 1 tablet (8 mg total) by mouth every 8 (eight) hours as needed for nausea.     pantoprazole 40 MG tablet  Commonly known as:  PROTONIX  Take 40 mg by mouth 2 (two) times daily. Reported on 04/01/2016          Allergies  Allergen Reactions  . Oxycodone Anaphylaxis  . Ciprofloxacin Itching and Rash  . Doxycycline Nausea And Vomiting  . Morphine And Related Swelling and Other (See Comments)    Pt reported throat swelling to morphine.  Has tolerated Norco and Dilaudid.  Marland Kitchen Penicillins Rash and Other (See Comments)    .Marland KitchenHas patient had a PCN reaction causing immediate rash, facial/tongue/throat swelling, SOB or lightheadedness with hypotension: Yes Has patient had a PCN reaction causing severe rash involving mucus membranes or skin necrosis: No Has patient had a PCN reaction that required hospitalization UNKNOWN - childhood allergy  Has patient had a PCN reaction occurring within the last 10 years: No If all of the above answers are "NO", then may proceed with Cephalosporin use.         Follow-up Information    Follow up with SMITH,KRISTI, MD. Schedule an appointment as soon as possible for a visit in 1 week.   Specialty:  Family Medicine   Contact information:   [place] [address] [phone]       Follow up with Edward Jolly, MD. Schedule an appointment as soon as possible for a visit in 1 week.   Specialty:  General Surgery   Contact information:   [address] [address] [phone]       Follow up with Silvano Rusk, MD. Schedule an appointment as soon as possible for a visit in 1 week.   Specialty:  Gastroenterology   Contact information:   [address] [place] [address] [phone]       Major procedures and Radiology Reports - PLEASE review detailed and final reports thoroughly  -     EGD   PROCEDURE: The patient was in room 1 at [facility] endoscopy unit. The patient was monitored with a pulse oximetry, BP cuff,  and EKG. The patient has nasal O2 flowing during the procedure. The patient's anesthesia was supervised by anesthesia.   The patient was positioned in the left lateral  decubitus position. A flexible Pentax endoscope was passed down the throat without difficulty.   Findings include:  Esophagus: Normal, no evidence of stricture or narrowwing  GE junction at: 36 cm  Stomach pouch: "Angulation" at 43 cm. The stomach may be mildly dilated proximal to the "angulation". No ulcer or obvious mass in the stomach lumen. The lumen is widely patent I can easily work around the "angulation".  Distal staple line of sleeve: I really could not see the staple line  Pylorus distance: 50 cm, widely open.  Duodenum: No ulcer, normal anatomy.  CLO test: Done, results pending.  PLAN: Photos taken and given to patient.She was admitted for bleeding from a Hemorrhoid - she can go home and get follow up with Dr. Excell Seltzer.   Alphonsa Overall, MD, [facility] Surgery Pager: [phone] Office phone: [phone]   Ct Head Wo Contrast  03/24/2016  CLINICAL DATA:  Dizziness and nausea following fall earlier today ; neck pain EXAM: CT HEAD WITHOUT CONTRAST CT CERVICAL SPINE WITHOUT CONTRAST TECHNIQUE: Multidetector CT imaging of the head and cervical spine was performed following the standard protocol without intravenous contrast. Multiplanar CT image reconstructions of the cervical spine were also generated. COMPARISON:  Head CT March 12, 2015 FINDINGS: CT HEAD FINDINGS The ventricles are normal in size and configuration. There is mild invagination of CSF into the sella. There is no intracranial mass, hemorrhage, extra-axial fluid collection, or midline shift. Gray-white compartments are normal. No acute infarct evident. No vascular lesions are evident. The bony calvarium appears intact. 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 Mastoid air cells are clear. Visualized orbits appear symmetric bilaterally. Visualized paranasal sinuses are clear. CT CERVICAL SPINE  FINDINGS There is no fracture or spondylolisthesis. Prevertebral soft tissues and predental space regions are normal. The disc spaces appear normal. There is a small focus of calcification in the anterior ligament at C5-6. There is no nerve root edema or effacement. No disc extrusion or stenosis. IMPRESSION: CT head: There is mild invagination of CSF into the sella. Ventricles are normal in size and configuration. No intracranial mass, hemorrhage, or extra-axial fluid collection. Gray-white compartments appear normal. CT cervical spine: Small focus of calcification in the anterior ligament at C5-6. No appreciable disc space narrowing. No disc extrusion or stenosis. No nerve root edema or effacement. No fracture or spondylolisthesis. Electronically Signed   By: Lowella Grip III M.D.   On: 03/24/2016 11:53   Ct Cervical Spine Wo Contrast  03/24/2016  CLINICAL DATA:  Dizziness and nausea following fall earlier today ; neck pain EXAM: CT HEAD WITHOUT CONTRAST CT CERVICAL SPINE WITHOUT CONTRAST TECHNIQUE: Multidetector CT imaging of the head and cervical spine was performed following the standard protocol without intravenous contrast. Multiplanar CT image reconstructions of the cervical spine were also generated. COMPARISON:  Head CT March 12, 2015 FINDINGS: CT HEAD FINDINGS The ventricles are normal in size and configuration. There is mild invagination of CSF into the sella. There is no intracranial mass, hemorrhage, extra-axial fluid collection, or midline shift. Gray-white compartments are normal. No acute infarct evident. No vascular lesions are evident. The bony calvarium appears intact. 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 Mastoid air cells are clear. Visualized orbits appear symmetric bilaterally. Visualized paranasal sinuses are clear. CT CERVICAL SPINE FINDINGS There is  no fracture or spondylolisthesis. Prevertebral soft tissues and predental space regions are normal. The disc spaces  appear normal. There is a small focus of calcification in the anterior ligament at C5-6. There is no nerve root edema or effacement. No disc extrusion or stenosis. IMPRESSION: CT head: There is mild invagination of CSF into the sella. Ventricles are normal in size and configuration. No intracranial mass, hemorrhage, or extra-axial fluid collection. Gray-white compartments appear normal. CT cervical spine: Small focus of calcification in the anterior ligament at C5-6. No appreciable disc space narrowing. No disc extrusion or stenosis. No nerve root edema or effacement. No fracture or spondylolisthesis. Electronically Signed   By: Lowella Grip III M.D.   On: 03/24/2016 11:53   Dg Duanne Limerick  W/kub  03/16/2016  CLINICAL DATA:  Status post gastric sleeve procedure. Patient with nausea after eating. EXAM: UPPER GI SERIES WITH KUB TECHNIQUE: After obtaining a scout radiograph a routine upper GI series was performed using thin and high density barium. FLUOROSCOPY TIME:  Radiation Exposure Index (as provided by the fluoroscopic device): If the device does not provide the exposure index: Fluoroscopy Time (in minutes and seconds): 4 minutes and 48 seconds. Number of Acquired Images: COMPARISON:  CT scan 01/22/2016. Upper GI studies from 11/07/2015 and 01/15/2016. FINDINGS: Pre-procedure KUB shows a normal bowel gas pattern. Surgical clips are seen in the region of the esophagogastric junction. Frontal and lateral views of the hypopharynx while swallowing are normal. Esophageal motility is normal. After several swallows of barium to assess motility, the patient became uncomfortable with abdominal discomfort and nausea. However, no vomiting during the study. A the small sliding-type hiatal hernia is identified. There was no evidence of hernia on the CT scan of 01/22/2016, suggesting interval development. Stomach has otherwise expected postoperative appearance for laparoscopic sleeve gastrectomy. Gastric emptying is prompt  through a normal pylorus. Duodenum bulb is normal. The duodenum C-loop is normally positioned and nondilated. Ligament of Treitz is in the expected anatomic location. Despite normal appearance of the esophagus during the exam, when a 13 mm barium tablet was given, it lodged in the proximal thoracic esophagus, at about the level of the carina. The tablet remained at this position despite multiple repeated swallows of water and barium. All approximately the eighth or ninth swallow, the tablet did pass the esophagus into the stomach. IMPRESSION: 1. Small hiatal hernia on today's study was not visible on previous CT of 01/22/2016. 2. Normal esophageal motility. Barium tablet becomes lodged in the proximal thoracic esophagus with no underlying focal stricture or lesion evident. After multiple swallows of water and thin barium, the tablet did subsequently pass the esophagus and into the stomach. 3. Expected postoperative appearance of the stomach with brisk emptying through a normal pylorus. 4. Normal duodenum. Electronically Signed   By: Misty Stanley M.D.   On: 03/16/2016 11:18    Micro Results    No results found for this or any previous visit (from the past 240 hour(s)).  Today   Subjective    [name] today has no headache,no chest abdominal pain,no new weakness tingling or numbness, feels much better wants to go home today.     Objective   Blood pressure 118/73, pulse 68, temperature 98.5 F (36.9 C), temperature source Oral, resp. rate 16, height 5' 7"  (1.702 m), weight 111.585 kg (246 lb), last menstrual period 02/28/2016, SpO2 100 %.   Intake/Output Summary (Last 24 hours) at 04/06/16 1518 Last data filed at 04/06/16 1350  Gross per 24  hour  Intake 2720.01 ml  Output      0 ml  Net 2720.01 ml    Exam Awake Alert, Oriented x 3, No new F.N deficits, Normal affect [place].AT,PERRAL Supple Neck,No JVD, No cervical lymphadenopathy appriciated.  Symmetrical Chest wall movement, Good  air movement bilaterally, CTAB RRR,No Gallops,Rubs or new Murmurs, No Parasternal Heave +ve B.Sounds, Abd Soft, Non tender, No organomegaly appriciated, No rebound -guarding or rigidity. No Cyanosis, Clubbing or edema, No new Rash or bruise   Data Review   CBC w Diff: Lab Results  Component Value Date   WBC 7.4 04/06/2016   WBC 10.7* 04/01/2016   HGB 10.7* 04/06/2016   HGB 10.3* 04/01/2016   HCT 33.5* 04/06/2016   HCT 32.3* 04/01/2016   PLT 281 04/06/2016   LYMPHOPCT 35 04/04/2016   MONOPCT 6 04/04/2016   EOSPCT 3 04/04/2016   BASOPCT 0 04/04/2016    CMP: Lab Results  Component Value Date   NA 139 04/05/2016   K 3.9 04/05/2016   CL 113* 04/05/2016   CO2 20* 04/05/2016   BUN <5* 04/05/2016   CREATININE 0.67 04/05/2016   CREATININE 0.73 04/01/2016   PROT 6.3* 04/05/2016   ALBUMIN 3.0* 04/05/2016   BILITOT 1.4* 04/05/2016   ALKPHOS 50 04/05/2016   AST 14* 04/05/2016   ALT 6* 04/05/2016  .   Total Time in preparing paper work, data evaluation and todays exam - 35 minutes  Thurnell Lose M.D on 04/06/2016 at 3:18 PM  [facility]   Office  [phone]

## 2016-04-07 ENCOUNTER — Encounter (HOSPITAL_COMMUNITY): Payer: Self-pay | Admitting: Surgery

## 2016-04-07 LAB — CLOTEST (H. PYLORI), BIOPSY: HELICOBACTER SCREEN: NEGATIVE

## 2016-04-23 ENCOUNTER — Encounter: Payer: Self-pay | Admitting: Dietician

## 2016-04-23 ENCOUNTER — Other Ambulatory Visit: Payer: Self-pay | Admitting: General Surgery

## 2016-04-23 ENCOUNTER — Encounter: Payer: 59 | Admitting: Dietician

## 2016-04-23 NOTE — Progress Notes (Signed)
Follow-up visit:  3.5 months Post-Operative Sleeve gastrectomy Surgery  Medical Nutrition Therapy:  Appt start time: 7893 end time:  1115  Primary concerns today: Post-operative Bariatric Surgery Nutrition Management.  03/11/2016: [name] returns today having lost a total of 42.6 lbs. She reports that she is unable to tolerate any solid foods and is still only taking in liquids. Having foaming and "sliming" when she eats anything. She states that when she eats she is chewing foods to "paste" consistency. She reports that it "takes an hour to eat a piece of chicken the size of her fingernail" and it causes vomiting. Saw Dr. Excell Seltzer (bariatric surgeon) on June 1. She state she had some "imaging" done and reports that swelling was noted. [name] reports that is takes her about 15 minutes to take in 1 oz of protein shake; only taking in about 24 oz of liquids throughout the day (at least 6 oz protein shake, decaf coffee with unflavored protein powder, water). Struggling with nausea. Takes protonix daily, zofran before eating but patient states that this does not help. Feels like her overall intake has "gotten worse." She has not been able to gradually increase her intake. Having pain in her ribs and in the middle of her back on the right side. She feels like her rib pain is due to frequent vomiting. Reports watery, yellow stools that burn.   04/01/2016: [name] returns today with reports of worsening symptoms. She continues to be unable to tolerate any solid food and is now unable to tolerate protein shakes. Patient reports that her PCP wanted her to be seen by nutrition to discuss feeding tube. Burlene has an endoscopy scheduled on 7/10. She had an upper GI last week and states that she vomited the Barium solution. Patient reports feeling very fatigued, depressed all the time and has a chronic migraine and is especially sensitive to light. Fell in the shower last week; went to ED and received 2L fluid +  potassium. "If I move around too much I start throwing up." States that her doctor advised her to take sips of apple juice or anything she can tolerate. "I feel like I am starving to death." Having diarrhea when she has decaf coffee; has a bowel movement about 1x a day (yellow, watery stools that burn). Otherwise she may feel like she has to have a bowel movement and will only have gas. [name] states she is only taking in 8-10 oz water and sips of juice (about 2 oz per day) total daily. "I vomit every time I try to eat something." Threw up after 2 teaspoons of beans 2 nights ago. Continues to try different kinds of protein shake and states this causes vomiting. Patient reports the only solid food she has tolerated since surgery is 1 tsp tuna and a "pinch of chicken."   04/23/2016: [name] returns having lost another 10 pounds in the last 3 weeks. She is having concerns about depression and feels that this may be related to her inability to eat. Reports chronic dizziness and nausea. States that she she is miserable and wishes she did not have bariatric surgery. Still trying to eat every day but vomits anything that is not water or juice. Patient states that food comes up followed by "sliming" and mucus. Tried scrambled eggs and was sick for 2 hours. Not taking in any protein shake; vomits shake as well. Gets nausea and cramping with even water or juice. Agrees that it is becoming a psychological fear to eat. Drinking up  to 16 oz per day of juice and water. Unable to take multivitamin (has tried liquid or chewable). Thiamin level was 48 nmol/L a fw weeks ago (normal range 74-222 nmol/L). Last bowel movement was last week ("yellow and stringy and mucousy"). States that the insides of her legs are numb and tingling; patient reports this has been happening for a few weeks.   Contacted surgeon, Dr. Excell Seltzer, to relay patient's report of leg tingling and numbness and concerns about low thiamin level.  Plan:  Patient to receive IV thiamin at hydration clinic tomorrow morning.   Surgery date: 01/14/2016 Surgery type: sleeve gastrectomy Start weight at [facility]: 301 lbs on 11/18/2015, 302 lbs on 01/06/16 Weight today: 238 lbs Weight change: 10.2 lbs Total weight lost: 64 lbs  TANITA  BODY COMP RESULTS  01/06/16 01/28/16 03/11/16 04/01/16 04/23/16   BMI (kg/m^2) 47.3 43.4 40.6 38.9 37.3   Fat Mass (lbs) 171 154.5 137.8 128.8 120.6   Fat Free Mass (lbs) 131 122.5 121.6 119.4 117.4   Total Body Water (lbs) 96 89.5 90 88 86    Preferred Learning Style:   No preference indicated   Learning Readiness:   Ready  Fluid intake: ~12 oz  Estimated total protein intake: 0  Medications: see list Supplementation: has difficulty taking due to nausea and vomiting (takes B12 shots)   Using straws: no Drinking while eating: n/a Hair loss: yes  Carbonated beverages: none N/V/D/C: diarrhea and vomiting Dumping syndrome: none  Recent physical activity:  Did not assess today  Progress Towards Goal(s):  In progress.  Handouts given during visit include:  none   Nutritional Diagnosis:  Driscoll-3.3 Overweight/obesity related to past poor dietary habits and physical inactivity as evidenced by patient w/ recent sleeve gastrectomy surgery following dietary guidelines for continued weight loss.     Intervention:  Nutrition counseling provided. Encouraged patient to continue sipping fluids throughout the day as tolerated and follow up with surgeon.   Teaching Method Utilized:  Visual Auditory Hands on  Barriers to learning/adherence to lifestyle change: inability to tolerate adequate fluid and protein  Demonstrated degree of understanding via:  Teach Back   Monitoring/Evaluation:  Dietary intake, exercise, and body weight. Follow up in 3-4 weeks.

## 2016-04-24 ENCOUNTER — Other Ambulatory Visit: Payer: Self-pay | Admitting: General Surgery

## 2016-04-24 ENCOUNTER — Ambulatory Visit (HOSPITAL_COMMUNITY)
Admission: RE | Admit: 2016-04-24 | Discharge: 2016-04-24 | Disposition: A | Discharge status: Home / Self Care | Payer: 59 | Source: Ambulatory Visit | Attending: Family Medicine | Admitting: Family Medicine

## 2016-04-24 DIAGNOSIS — Z9884 Bariatric surgery status: Secondary | ICD-10-CM | POA: Diagnosis not present

## 2016-04-24 MED ORDER — THIAMINE HCL 100 MG/ML IJ SOLN
INTRAVENOUS | Status: AC
  Administered 2016-04-24: 10:00:00 via INTRAVENOUS
  Filled 2016-04-24: qty 1000

## 2016-04-24 NOTE — Procedures (Signed)
[facility]  Procedure Note  [name] GXE:[id number] DOB: Feb 05, 1976 DOA: 04/24/2016   Dr. Excell Seltzer   Associated Diagnosis: Z98.84  Procedure Note: IV started, IV fluids infused per order.  IV discontinued   Condition During Procedure: patient stable   Condition at Discharge: patient stable, ambulatory at discharge   Roberto Scales, [facility]

## 2016-04-24 NOTE — Discharge Instructions (Signed)
Infusion of 1000 ml fluid with thiamine and folic acid

## 2016-04-27 ENCOUNTER — Ambulatory Visit (HOSPITAL_COMMUNITY)
Admission: RE | Admit: 2016-04-27 | Discharge: 2016-04-27 | Disposition: A | Discharge status: Home / Self Care | Payer: 59 | Source: Ambulatory Visit | Attending: Family Medicine | Admitting: Family Medicine

## 2016-04-27 DIAGNOSIS — Z9884 Bariatric surgery status: Secondary | ICD-10-CM | POA: Diagnosis not present

## 2016-04-27 MED ORDER — THIAMINE HCL 100 MG/ML IJ SOLN
50.0000 mg | INTRAMUSCULAR | Status: DC
  Administered 2016-04-27: 50 mg via INTRAMUSCULAR
  Filled 2016-04-27: qty 0.5

## 2016-04-27 MED ORDER — THIAMINE HCL 100 MG/ML IJ SOLN: 50.0000 mg | INTRAMUSCULAR | Status: DC

## 2016-04-27 NOTE — Discharge Instructions (Signed)
Thiamine, Vitamin B1 injection What is this medicine? THIAMINE (THAHY uh min) is vitamin B1. It is added to a healthy diet to prevent or to treat low vitamin B1 levels. This medicine may be used for other purposes; ask your health care provider or pharmacist if you have questions. What should I tell my health care provider before I take this medicine? They need to know if you have any of the following conditions: -Wernicke's disease -an unusual or allergic reaction to B vitamins, other medicines, foods, dyes, or preservatives -pregnant or trying to get pregnant -breast-feeding How should I use this medicine? This medicine is for injection into a muscle or vein. It is usually given by a health care professional in a hospital or clinic setting. If you get this medicine at home, you will be taught how to prepare and give this medicine. Use exactly as directed. Take your medicine at regular intervals. Do not take your medicine more often than directed. It is important that you put your used needles and syringes in a special sharps container. Do not put them in a trash can. If you do not have a sharps container, call your pharmacist or healthcare provider to get one. Talk to your pediatrician regarding the use of this medicine in children. Special care may be needed. Overdosage: If you think you have taken too much of this medicine contact a poison control center or emergency room at once. NOTE: This medicine is only for you. Do not share this medicine with others. What if I miss a dose? If you miss a dose, take it as soon as you can. If it is almost time for your next dose, take only that dose. Do not take double or extra doses. What may interact with this medicine? Interactions are not expected. This list may not describe all possible interactions. Give your health care provider a list of all the medicines, herbs, non-prescription drugs, or dietary supplements you use. Also tell them if you smoke,  drink alcohol, or use illegal drugs. Some items may interact with your medicine. What should I watch for while using this medicine? Follow a healthy diet. Taking a vitamin supplement does not replace the need for a balanced diet. Some foods that contain this vitamin naturally are yeast, beans, peas, nuts, pork, and beef. Limit alcohol, smoking, and stress. Too much of this vitamin can be unsafe. Talk to your doctor or health care provider about how much is right for you. What side effects may I notice from receiving this medicine? Side effects that you should report to your doctor or health care professional as soon as possible: -allergic reactions like skin rash, itching or hives, swelling of the face, lips, or tongue -chest tightness -fast, irregular heartbeat -irritable or restless -nausea, vomiting -pain, swelling, or redness at site where injected -sweating -unusually bleeding or bruising Side effects that usually do not require medical attention (report to your doctor or health care professional if they continue or are bothersome): -sneezing This list may not describe all possible side effects. Call your doctor for medical advice about side effects. You may report side effects to FDA at 1-800-FDA-1088. Where should I keep my medicine? Keep out of the reach of children. Store at room temperature between 15 and 30 degrees C (59 and 85 degrees F). Protect from heat and light. Throw away any unused medicine after the expiration date. NOTE: This sheet is a summary. It may not cover all possible information. If you have questions about  this medicine, talk to your doctor, pharmacist, or health care provider.    2016, Elsevier/Gold Standard. (2008-05-31 12:52:00)

## 2016-04-27 NOTE — Procedures (Signed)
[facility]  Procedure Note  SHAREEN CAPWELL RFV:[id number] DOB: [birth year] DOA: 04/27/2016   Dr. Excell Seltzer  Associated Diagnosis: z98.84  Procedure Note: IM injection of thiamine   Condition During Procedure: stable, no complaints   Condition at Discharge: ambulatory   Roberto Scales, [facility]

## 2016-04-29 ENCOUNTER — Ambulatory Visit (HOSPITAL_COMMUNITY)
Admission: RE | Admit: 2016-04-29 | Discharge: 2016-04-29 | Disposition: A | Discharge status: Home / Self Care | Payer: 59 | Source: Ambulatory Visit | Attending: Family Medicine | Admitting: Family Medicine

## 2016-04-29 DIAGNOSIS — Z9884 Bariatric surgery status: Secondary | ICD-10-CM | POA: Insufficient documentation

## 2016-04-29 MED ORDER — THIAMINE HCL 100 MG/ML IJ SOLN
50.0000 mg | Freq: Every day | INTRAMUSCULAR | Status: DC
  Administered 2016-04-29: 50 mg via INTRAMUSCULAR
  Filled 2016-04-29: qty 0.5

## 2016-04-29 NOTE — Progress Notes (Signed)
Patient ID: [name], female   DOB: [birth year], 40 y.o.   MRN: [id number] Dr. Excell Seltzer  Associated Diagnosis: z98.84  Procedure Note: IM injection of thiamine   Condition During Procedure: stable, no complaints   Condition at Discharge: ambulatory

## 2016-05-01 ENCOUNTER — Ambulatory Visit (HOSPITAL_COMMUNITY)
Admission: RE | Admit: 2016-05-01 | Discharge: 2016-05-01 | Disposition: A | Discharge status: Home / Self Care | Payer: 59 | Source: Ambulatory Visit | Attending: Family Medicine | Admitting: Family Medicine

## 2016-05-01 DIAGNOSIS — Z9884 Bariatric surgery status: Secondary | ICD-10-CM | POA: Diagnosis not present

## 2016-05-01 MED ORDER — THIAMINE HCL 100 MG/ML IJ SOLN
50.0000 mg | Freq: Once | INTRAMUSCULAR | Status: AC
  Administered 2016-05-01: 50 mg via INTRAMUSCULAR
  Filled 2016-05-01: qty 0.5

## 2016-05-01 NOTE — Pre-Procedure Instructions (Signed)
[name]  05/01/2016      [id number] [address] [place] [address]Phone: [phone]: [phone]   Your procedure is scheduled on Fri, Aug 11 @ 7:15 AM  [facility]  Call this number if you have problems the morning of surgery:  [phone]  Remember:  Do not eat food or drink liquids after midnight.  Take these medicines the morning of surgery with A SIP OF WATER Albuterol<Bring Your Inhaler With You>,Flonase(Fluticasone),Pain Pill(if needed),Zofran(Ondansetron-if needed),and Pantoprazole(Protonix)             No Goody's,BC's,Aleve,Advil,Motrin,Ibuprofen,Fish Oil,or any Herbal Medications.    Do not wear jewelry, make-up or nail polish.  Do not wear lotions, powders, or perfumes.    Do not shave 48 hours prior to surgery.    Do not bring valuables to the hospital.  [facility] or [facility].  Contacts, dentures or bridgework may not be worn into surgery.  Leave your suitcase in the car.  After surgery it may be brought to your room.  For patients admitted to the hospital, discharge time will be determined by your treatment team.  Patients discharged the day of surgery will not be allowed to drive home.    Special [facility] - Preparing for Surgery  Before surgery, you can play an important role.  Because skin is not sterile, your skin needs to be as free of germs as possible.  You can reduce the number of germs on you skin by washing with CHG (chlorahexidine gluconate) soap before surgery.  CHG is an antiseptic cleaner which kills germs and bonds with the skin to continue killing germs even after washing.  Please DO NOT use if you have an allergy to CHG or antibacterial soaps.  If your skin becomes reddened/irritated stop using the CHG and inform your nurse when you arrive at Short Stay.  Do not shave (including legs and  underarms) for at least 48 hours prior to the first CHG shower.  You may shave your face.  Please follow these instructions carefully:   1.  Shower with CHG Soap the night before surgery and the                                morning of Surgery.  2.  If you choose to wash your hair, wash your hair first as usual with your       normal shampoo.  3.  After you shampoo, rinse your hair and body thoroughly to remove the                      Shampoo.  4.  Use CHG as you would any other liquid soap.  You can apply chg directly       to the skin and wash gently with scrungie or a clean washcloth.  5.  Apply the CHG Soap to your body ONLY FROM THE NECK DOWN.        Do not use on open wounds or open sores.  Avoid contact with your eyes,       ears, mouth and genitals (private parts).  Wash genitals (private parts)       with your normal soap.  6.  Wash thoroughly, paying special attention to the area where your  surgery        will be performed.  7.  Thoroughly rinse your body with warm water from the neck down.  8.  DO NOT shower/wash with your normal soap after using and rinsing off       the CHG Soap.  9.  Pat yourself dry with a clean towel.            10.  Wear clean pajamas.            11.  Place clean sheets on your bed the night of your first shower and do not        sleep with pets.  Day of Surgery  Do not apply any lotions/deoderants the morning of surgery.  Please wear clean clothes to the hospital/surgery center.    Please read over the following fact sheets that you were given. Surgical Site Infection Prevention

## 2016-05-01 NOTE — Procedures (Signed)
[facility]  Procedure Note  [name] KKD:[id number] DOB: 1976-05-30 DOA: 05/01/2016  Dr. Excell Seltzer  Associated Diagnosis: z98.84  Procedure Note: IM injection of thiamine   Condition During Procedure: stable, no complaints   Condition at Discharge: ambulatory    Roberto Scales, [facility]

## 2016-05-01 NOTE — Discharge Instructions (Signed)
Thiamine, Vitamin B1 injection What is this medicine? THIAMINE (THAHY uh min) is vitamin B1. It is added to a healthy diet to prevent or to treat low vitamin B1 levels. This medicine may be used for other purposes; ask your health care provider or pharmacist if you have questions. What should I tell my health care provider before I take this medicine? They need to know if you have any of the following conditions: -Wernicke's disease -an unusual or allergic reaction to B vitamins, other medicines, foods, dyes, or preservatives -pregnant or trying to get pregnant -breast-feeding How should I use this medicine? This medicine is for injection into a muscle or vein. It is usually given by a health care professional in a hospital or clinic setting. If you get this medicine at home, you will be taught how to prepare and give this medicine. Use exactly as directed. Take your medicine at regular intervals. Do not take your medicine more often than directed. It is important that you put your used needles and syringes in a special sharps container. Do not put them in a trash can. If you do not have a sharps container, call your pharmacist or healthcare provider to get one. Talk to your pediatrician regarding the use of this medicine in children. Special care may be needed. Overdosage: If you think you have taken too much of this medicine contact a poison control center or emergency room at once. NOTE: This medicine is only for you. Do not share this medicine with others. What if I miss a dose? If you miss a dose, take it as soon as you can. If it is almost time for your next dose, take only that dose. Do not take double or extra doses. What may interact with this medicine? Interactions are not expected. This list may not describe all possible interactions. Give your health care provider a list of all the medicines, herbs, non-prescription drugs, or dietary supplements you use. Also tell them if you smoke,  drink alcohol, or use illegal drugs. Some items may interact with your medicine. What should I watch for while using this medicine? Follow a healthy diet. Taking a vitamin supplement does not replace the need for a balanced diet. Some foods that contain this vitamin naturally are yeast, beans, peas, nuts, pork, and beef. Limit alcohol, smoking, and stress. Too much of this vitamin can be unsafe. Talk to your doctor or health care provider about how much is right for you. What side effects may I notice from receiving this medicine? Side effects that you should report to your doctor or health care professional as soon as possible: -allergic reactions like skin rash, itching or hives, swelling of the face, lips, or tongue -chest tightness -fast, irregular heartbeat -irritable or restless -nausea, vomiting -pain, swelling, or redness at site where injected -sweating -unusually bleeding or bruising Side effects that usually do not require medical attention (report to your doctor or health care professional if they continue or are bothersome): -sneezing This list may not describe all possible side effects. Call your doctor for medical advice about side effects. You may report side effects to FDA at 1-800-FDA-1088. Where should I keep my medicine? Keep out of the reach of children. Store at room temperature between 15 and 30 degrees C (59 and 85 degrees F). Protect from heat and light. Throw away any unused medicine after the expiration date. NOTE: This sheet is a summary. It may not cover all possible information. If you have questions about  this medicine, talk to your doctor, pharmacist, or health care provider.    2016, Elsevier/Gold Standard. (2008-05-31 12:52:00)

## 2016-05-04 ENCOUNTER — Encounter (HOSPITAL_COMMUNITY): Payer: Self-pay

## 2016-05-04 ENCOUNTER — Encounter (HOSPITAL_COMMUNITY)
Admission: RE | Admit: 2016-05-04 | Discharge: 2016-05-04 | Disposition: A | Discharge status: Home / Self Care | Payer: 59 | Source: Ambulatory Visit | Attending: General Surgery | Admitting: General Surgery

## 2016-05-04 DIAGNOSIS — K910 Vomiting following gastrointestinal surgery: Secondary | ICD-10-CM | POA: Insufficient documentation

## 2016-05-04 DIAGNOSIS — Z01812 Encounter for preprocedural laboratory examination: Secondary | ICD-10-CM | POA: Insufficient documentation

## 2016-05-04 DIAGNOSIS — Z9884 Bariatric surgery status: Secondary | ICD-10-CM | POA: Insufficient documentation

## 2016-05-04 HISTORY — DX: Personal history of colon polyps, unspecified: Z86.0100

## 2016-05-04 HISTORY — DX: Personal history of other medical treatment: Z92.89

## 2016-05-04 HISTORY — DX: Depression, unspecified: F32.A

## 2016-05-04 HISTORY — DX: Pain in unspecified joint: M25.50

## 2016-05-04 HISTORY — DX: Personal history of other diseases of the nervous system and sense organs: Z86.69

## 2016-05-04 HISTORY — DX: Personal history of other diseases of the respiratory system: Z87.09

## 2016-05-04 HISTORY — DX: Dizziness and giddiness: R42

## 2016-05-04 HISTORY — DX: Effusion, unspecified joint: M25.40

## 2016-05-04 HISTORY — DX: Anxiety disorder, unspecified: F41.9

## 2016-05-04 HISTORY — DX: Gastro-esophageal reflux disease without esophagitis: K21.9

## 2016-05-04 HISTORY — DX: Ulcerative colitis, unspecified, without complications: K51.90

## 2016-05-04 HISTORY — DX: Dehydration: E86.0

## 2016-05-04 HISTORY — DX: Family history of other specified conditions: Z84.89

## 2016-05-04 HISTORY — DX: Nausea: R11.0

## 2016-05-04 HISTORY — DX: Anuria and oliguria: R34

## 2016-05-04 HISTORY — DX: Personal history of colonic polyps: Z86.010

## 2016-05-04 HISTORY — DX: Major depressive disorder, single episode, unspecified: F32.9

## 2016-05-04 HISTORY — DX: Pneumonia, unspecified organism: J18.9

## 2016-05-04 LAB — CBC
HCT: 37.6 % (ref 36.0–46.0)
Hemoglobin: 11.5 g/dL — ABNORMAL LOW (ref 12.0–15.0)
MCH: 24.3 pg — AB (ref 26.0–34.0)
MCHC: 30.6 g/dL (ref 30.0–36.0)
MCV: 79.3 fL (ref 78.0–100.0)
PLATELETS: 260 10*3/uL (ref 150–400)
RBC: 4.74 MIL/uL (ref 3.87–5.11)
RDW: 24.7 % — AB (ref 11.5–15.5)
WBC: 8.3 10*3/uL (ref 4.0–10.5)

## 2016-05-04 LAB — HCG, SERUM, QUALITATIVE: PREG SERUM: NEGATIVE

## 2016-05-04 LAB — BASIC METABOLIC PANEL
Anion gap: 10 (ref 5–15)
BUN: 8 mg/dL (ref 6–20)
CO2: 20 mmol/L — AB (ref 22–32)
CREATININE: 0.77 mg/dL (ref 0.44–1.00)
Calcium: 9.3 mg/dL (ref 8.9–10.3)
Chloride: 110 mmol/L (ref 101–111)
GFR calc Af Amer: >60 mL/min (ref >60)
GLUCOSE: 77 mg/dL (ref 65–99)
Potassium: 4.2 mmol/L (ref 3.5–5.1)
Sodium: 140 mmol/L (ref 135–145)

## 2016-05-04 MED ORDER — CHLORHEXIDINE GLUCONATE CLOTH 2 % EX PADS: 6.0000 | MEDICATED_PAD | Freq: Once | CUTANEOUS | Status: DC

## 2016-05-04 NOTE — Progress Notes (Addendum)
Cardiologist denies  Medical Md is Dr.Caldonia Leap Thayer Jew denies  Stress test denies  Heart cath denies  EKG in epic from 03-24-16  CXR in epic from 11-07-15

## 2016-05-07 ENCOUNTER — Encounter (HOSPITAL_COMMUNITY): Payer: Self-pay | Admitting: *Deleted

## 2016-05-08 NOTE — Progress Notes (Signed)
Pt verbalized understanding of surgical date change; aware to arrive at [facility] Stay on 05/11/2016 at 12 Noon. Verbalized understanding of no food after midnight but can take clear liquid diet from midnight till 8:30 am day of surgery then nothing by mouth.  Pt states she is not taking Xarelto currently.

## 2016-05-08 NOTE — Progress Notes (Signed)
Requesting surgical orders to be reviewed and replaced for surgical procedure scheduled for 05/11/2016. Thanks.

## 2016-05-10 NOTE — H&P (Signed)
History of Present Illness [name] T. Braylinn Gulden MD; 05/08/2016 9:29 AM) The patient is a 40 year old female presenting status-post bariatric surgery. She returns for follow-up status post sleeve gastrectomy on January 14, 2016, 3 months postoperatively. Her postoperative course has been complicated by persistent nausea and vomiting. We did obtain an upper GI series about 2 weeks ago which showed actually prompt emptying of barium from her sleeve without obvious stricture or anatomic abnormality. A barium tablet did hang up in her upper esophagus, uncertain significance. Her symptoms are unchanged to possibly worse. She will sometimes get nausea and retching with just motion without oral intake or sometimes just from taste or sometimes after it seems to reach her stomach causing cramps and nausea. She underwent upper endoscopy by Dr. Lucia Gaskins last week. There was no inflammation or ulcer or stricture of her sleeve. There was some smooth "angulation" at the mid stomach that was traversed very easily by the scope without any evidence of obstruction. CLO test was urease negative. She continues to remain very nauseated. States she gets some liquids and but is unable to tolerate solids. Even liquid intake is minimal. We obtained complete lab work and vitamin levels last week. These actually all were essentially within normal limits except for her thiamine which was low at 48. On questioning she is having some numbness on the inner aspect of both lower extremities. With this information we did send her for a 5 minute infusion and then 3 IM injections of 50 mg last week. She still feels some tingling in her legs. Her GI symptoms are unchanged and we are planning to proceed with placement of a feeding jejunostomy tube laparoscopically next week.     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 Hemorrhoidectomy Knee Surgery Left. Oral Surgery  Diagnostic Studies History Edward Jolly, MD; 05/08/2016 9:30 AM) Colonoscopy 1-5 years ago Pap Smear 1-5 years ago  Allergies Elbert Ewings, CMA; 05/08/2016 9:13 AM) Doxycycline Hyclate *Tetracyclines** Penicillin G Pot in Dextrose *PENICILLINS* OxyCODONE HCl *ANALGESICS - OPIOID* Ciprofloxacin HCl *Fluoroquinolones** Zofran ODT *ANTIEMETICS*  Medication History Elbert Ewings, CMA; 05/08/2016 9:13 AM) Protonix (40MG Tablet DR, 1 (one) Tablet Oral daily, Taken starting 01/09/2016) Active. Atrovent (0.03% Solution, Nasal) Active. Albuterol Sulfate HFA (108 (90 Base)MCG/ACT Aerosol Soln, Inhalation) Active. Hydrocortisone Acetate (25MG Suppository, Rectal) Active. Promethazine HCl (12.5MG Suppository, Rectal) Active. Voltaren (1% Gel, Transdermal) Active. Centrum Adults (Oral) Active. Phentermine HCl (15MG Capsule, Oral) Active. DULoxetine HCl (30MG Capsule DR Part, Oral) Active. Hydrocodone-Acetaminophen (5-325MG Tablet, Oral) Active. HydrOXYzine HCl (25MG Tablet, Oral) Active. Methocarbamol (500MG Tablet, Oral) Active. Mupirocin (2% Ointment, External) Active. Dicyclomine HCl (20MG Capsule, Oral) Active. Medications Reconciled  Social History Edward Jolly, MD; 05/08/2016 9:30 AM) Caffeine use Coffee, Tea. Tobacco use Current some day smoker. No drug use Alcohol use Occasional alcohol use.  Family History Edward Jolly, MD; 05/08/2016 9:30 AM) Heart disease in  female family member before age 89 Heart Disease Brother. Hypertension Father. Heart disease in female family member before age 64 Arthritis Father, Mother. Colon Cancer Family Members In General. Colon Polyps Father. Diabetes Mellitus Family Members In General.  Pregnancy / Birth History Edward Jolly, MD; 05/08/2016 9:30 AM) Contraceptive History Contraceptive implant, Depo-provera, Intrauterine device. Gravida 5 Para 4 Age at menarche 6 years. Regular periods  Vitals Elbert Ewings CMA; 05/08/2016 9:14 AM) 05/08/2016 9:13 AM Weight: 239.2 lb Height: 67in Body Surface Area: 2.18 m Body Mass Index: 37.46 kg/m  Temp.: 98.33F(Temporal)  Pulse: 90 (Regular)  BP: 124/84 (Sitting, Left Arm, Standard)       Physical Exam [name] T. Olan Kurek MD; 05/08/2016 9:31 AM) The physical exam findings are as follows: Note:Weight unchanged from last visit at 239 pounds  General: Alert, moderately obese Afro-American female, in no distress Skin: Warm and dry without rash or infection. HEENT: Sclera nonicteric. Pupils equal round and reactive. . Lungs: Breath sounds clear and equal. No wheezing or increased work of breathing. Cardiovascular: Regular rate and rhythm without murmer. No JVD or edema. Peripheral pulses intact. No carotid bruits. Abdomen: Nondistended. Soft and nontender. No masses palpable. No organomegaly. No palpable hernias. Extremities: No edema or joint swelling or deformity. No chronic venous stasis changes. Neurologic: Alert and fully oriented. Gait is limited by some chronic weakness right lower extremity Psychiatric: Normal mood and affect. Thought content appropriate with normal judgement and insight    Assessment & Plan [name] T. 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 I cannot clearly define the issue. I certainly would not  recommend revisional surgery at only 3 months postoperatively. She feels her current situation is in tolerable and that she is weak and needs nutrition. As we previously discussed I would therefore recommend proceeding with a feeding jejunostomy for nutritional support. I discussed this procedure in detail including its nature and potential complications including bowel leakage and tube problems such as occlusion and displacement. She understands the feedings would have to be given over a number of hours which would restrict her activities. She desires to proceed. We will recheck her thiamine level preoperatively. All of her and her son's questions were answered tod

## 2016-05-11 ENCOUNTER — Encounter (HOSPITAL_COMMUNITY)
Admission: AD | Disposition: A | Discharge status: Home Health | Payer: Self-pay | Source: Ambulatory Visit | Attending: General Surgery

## 2016-05-11 ENCOUNTER — Ambulatory Visit (HOSPITAL_COMMUNITY): Payer: 59 | Admitting: Certified Registered Nurse Anesthetist

## 2016-05-11 ENCOUNTER — Encounter (HOSPITAL_COMMUNITY): Payer: Self-pay

## 2016-05-11 ENCOUNTER — Inpatient Hospital Stay (HOSPITAL_COMMUNITY)
Admission: AD | Admit: 2016-05-11 | Discharge: 2016-05-15 | DRG: 394 | Disposition: A | Discharge status: Home Health | Payer: 59 | Source: Ambulatory Visit | Attending: General Surgery | Admitting: General Surgery

## 2016-05-11 DIAGNOSIS — M1990 Unspecified osteoarthritis, unspecified site: Secondary | ICD-10-CM | POA: Diagnosis present

## 2016-05-11 DIAGNOSIS — F17200 Nicotine dependence, unspecified, uncomplicated: Secondary | ICD-10-CM | POA: Diagnosis present

## 2016-05-11 DIAGNOSIS — F419 Anxiety disorder, unspecified: Secondary | ICD-10-CM | POA: Diagnosis present

## 2016-05-11 DIAGNOSIS — Z880 Allergy status to penicillin: Secondary | ICD-10-CM | POA: Diagnosis not present

## 2016-05-11 DIAGNOSIS — F329 Major depressive disorder, single episode, unspecified: Secondary | ICD-10-CM | POA: Diagnosis present

## 2016-05-11 DIAGNOSIS — Z86010 Personal history of colonic polyps: Secondary | ICD-10-CM

## 2016-05-11 DIAGNOSIS — Z885 Allergy status to narcotic agent status: Secondary | ICD-10-CM

## 2016-05-11 DIAGNOSIS — Z9889 Other specified postprocedural states: Secondary | ICD-10-CM | POA: Diagnosis present

## 2016-05-11 DIAGNOSIS — Z881 Allergy status to other antibiotic agents status: Secondary | ICD-10-CM | POA: Diagnosis not present

## 2016-05-11 DIAGNOSIS — Z8261 Family history of arthritis: Secondary | ICD-10-CM | POA: Diagnosis not present

## 2016-05-11 DIAGNOSIS — K5190 Ulcerative colitis, unspecified, without complications: Secondary | ICD-10-CM | POA: Diagnosis present

## 2016-05-11 DIAGNOSIS — Z833 Family history of diabetes mellitus: Secondary | ICD-10-CM

## 2016-05-11 DIAGNOSIS — R200 Anesthesia of skin: Secondary | ICD-10-CM | POA: Diagnosis present

## 2016-05-11 DIAGNOSIS — R112 Nausea with vomiting, unspecified: Secondary | ICD-10-CM | POA: Diagnosis present

## 2016-05-11 DIAGNOSIS — Z79891 Long term (current) use of opiate analgesic: Secondary | ICD-10-CM | POA: Diagnosis not present

## 2016-05-11 DIAGNOSIS — Z9884 Bariatric surgery status: Secondary | ICD-10-CM | POA: Diagnosis not present

## 2016-05-11 DIAGNOSIS — Z8249 Family history of ischemic heart disease and other diseases of the circulatory system: Secondary | ICD-10-CM

## 2016-05-11 DIAGNOSIS — Z800 Family history of malignant neoplasm of digestive organs: Secondary | ICD-10-CM

## 2016-05-11 DIAGNOSIS — K219 Gastro-esophageal reflux disease without esophagitis: Secondary | ICD-10-CM | POA: Diagnosis present

## 2016-05-11 DIAGNOSIS — K9189 Other postprocedural complications and disorders of digestive system: Principal | ICD-10-CM | POA: Diagnosis present

## 2016-05-11 DIAGNOSIS — Z6837 Body mass index (BMI) 37.0-37.9, adult: Secondary | ICD-10-CM | POA: Diagnosis not present

## 2016-05-11 DIAGNOSIS — M810 Age-related osteoporosis without current pathological fracture: Secondary | ICD-10-CM | POA: Diagnosis present

## 2016-05-11 DIAGNOSIS — Z888 Allergy status to other drugs, medicaments and biological substances status: Secondary | ICD-10-CM

## 2016-05-11 DIAGNOSIS — E669 Obesity, unspecified: Secondary | ICD-10-CM | POA: Diagnosis present

## 2016-05-11 DIAGNOSIS — Z79899 Other long term (current) drug therapy: Secondary | ICD-10-CM

## 2016-05-11 DIAGNOSIS — Y838 Other surgical procedures as the cause of abnormal reaction of the patient, or of later complication, without mention of misadventure at the time of the procedure: Secondary | ICD-10-CM | POA: Diagnosis present

## 2016-05-11 DIAGNOSIS — E440 Moderate protein-calorie malnutrition: Secondary | ICD-10-CM | POA: Diagnosis present

## 2016-05-11 HISTORY — DX: Headache, unspecified: R51.9

## 2016-05-11 HISTORY — DX: Personal history of urinary (tract) infections: Z87.440

## 2016-05-11 HISTORY — PX: GASTROJEJUNOSTOMY: SHX1697

## 2016-05-11 HISTORY — DX: Headache: R51

## 2016-05-11 SURGERY — GASTROJEJUNOSTOMY, LAPAROSCOPIC
Anesthesia: General

## 2016-05-11 MED ORDER — DEXAMETHASONE SODIUM PHOSPHATE 10 MG/ML IJ SOLN
INTRAMUSCULAR | Status: DC | PRN
  Administered 2016-05-11: 10 mg via INTRAVENOUS

## 2016-05-11 MED ORDER — KCL IN DEXTROSE-NACL 20-5-0.9 MEQ/L-%-% IV SOLN
INTRAVENOUS | Status: DC
  Administered 2016-05-11 – 2016-05-12 (×2): via INTRAVENOUS
  Administered 2016-05-12: 100 mL/h via INTRAVENOUS
  Administered 2016-05-15: 1000 mL via INTRAVENOUS
  Filled 2016-05-11 (×6): qty 1000

## 2016-05-11 MED ORDER — MIDAZOLAM HCL 2 MG/2ML IJ SOLN
INTRAMUSCULAR | Status: AC
  Filled 2016-05-11: qty 2

## 2016-05-11 MED ORDER — 0.9 % SODIUM CHLORIDE (POUR BTL) OPTIME
TOPICAL | Status: DC | PRN
  Administered 2016-05-11: 1000 mL

## 2016-05-11 MED ORDER — KETOROLAC TROMETHAMINE 30 MG/ML IJ SOLN
INTRAMUSCULAR | Status: AC
  Filled 2016-05-11: qty 1

## 2016-05-11 MED ORDER — SCOPOLAMINE 1 MG/3DAYS TD PT72
1.0000 | MEDICATED_PATCH | Freq: Once | TRANSDERMAL | Status: DC
  Administered 2016-05-11: 1.5 mg via TRANSDERMAL

## 2016-05-11 MED ORDER — ONDANSETRON HCL 4 MG/2ML IJ SOLN
4.0000 mg | Freq: Once | INTRAMUSCULAR | Status: AC | PRN
  Administered 2016-05-11: 4 mg via INTRAVENOUS

## 2016-05-11 MED ORDER — ONDANSETRON HCL 4 MG/2ML IJ SOLN
INTRAMUSCULAR | Status: AC
  Filled 2016-05-11: qty 2

## 2016-05-11 MED ORDER — PROPOFOL 10 MG/ML IV BOLUS
INTRAVENOUS | Status: DC | PRN
  Administered 2016-05-11: 200 mg via INTRAVENOUS

## 2016-05-11 MED ORDER — KETOROLAC TROMETHAMINE 30 MG/ML IJ SOLN
INTRAMUSCULAR | Status: DC | PRN
  Administered 2016-05-11: 30 mg via INTRAVENOUS

## 2016-05-11 MED ORDER — ONDANSETRON HCL 4 MG/2ML IJ SOLN
INTRAMUSCULAR | Status: DC | PRN
  Administered 2016-05-11 (×2): 4 mg via INTRAVENOUS

## 2016-05-11 MED ORDER — ONDANSETRON HCL 4 MG/2ML IJ SOLN
INTRAMUSCULAR | Status: AC
  Administered 2016-05-11: 4 mg via INTRAVENOUS
  Filled 2016-05-11: qty 2

## 2016-05-11 MED ORDER — ROCURONIUM BROMIDE 100 MG/10ML IV SOLN
INTRAVENOUS | Status: DC | PRN
  Administered 2016-05-11: 5 mg via INTRAVENOUS
  Administered 2016-05-11: 30 mg via INTRAVENOUS
  Administered 2016-05-11: 5 mg via INTRAVENOUS

## 2016-05-11 MED ORDER — LACTATED RINGERS IR SOLN
Status: DC | PRN
  Administered 2016-05-11: 1000 mL

## 2016-05-11 MED ORDER — HYDROMORPHONE HCL 1 MG/ML IJ SOLN
1.0000 mg | INTRAMUSCULAR | Status: DC | PRN
  Administered 2016-05-11 – 2016-05-15 (×18): 1 mg via INTRAVENOUS
  Filled 2016-05-11 (×17): qty 1

## 2016-05-11 MED ORDER — LIDOCAINE HCL (CARDIAC) 20 MG/ML IV SOLN
INTRAVENOUS | Status: AC
  Filled 2016-05-11: qty 5

## 2016-05-11 MED ORDER — FENTANYL CITRATE (PF) 100 MCG/2ML IJ SOLN
INTRAMUSCULAR | Status: DC | PRN
  Administered 2016-05-11 (×4): 50 ug via INTRAVENOUS
  Administered 2016-05-11: 150 ug via INTRAVENOUS

## 2016-05-11 MED ORDER — FLUTICASONE PROPIONATE 50 MCG/ACT NA SUSP: 1.0000 | Freq: Every day | NASAL | Status: DC | PRN

## 2016-05-11 MED ORDER — SUCCINYLCHOLINE CHLORIDE 20 MG/ML IJ SOLN
INTRAMUSCULAR | Status: DC | PRN
  Administered 2016-05-11: 100 mg via INTRAVENOUS

## 2016-05-11 MED ORDER — SCOPOLAMINE 1 MG/3DAYS TD PT72
MEDICATED_PATCH | TRANSDERMAL | Status: AC
  Filled 2016-05-11: qty 1

## 2016-05-11 MED ORDER — MIDAZOLAM HCL 2 MG/2ML IJ SOLN
INTRAMUSCULAR | Status: DC | PRN
  Administered 2016-05-11: 2 mg via INTRAVENOUS

## 2016-05-11 MED ORDER — LACTATED RINGERS IV SOLN
INTRAVENOUS | Status: DC
  Administered 2016-05-11: 1000 mL via INTRAVENOUS
  Administered 2016-05-11: 16:00:00 via INTRAVENOUS

## 2016-05-11 MED ORDER — FENTANYL CITRATE (PF) 100 MCG/2ML IJ SOLN
INTRAMUSCULAR | Status: AC
  Filled 2016-05-11: qty 2

## 2016-05-11 MED ORDER — LIDOCAINE HCL (CARDIAC) 20 MG/ML IV SOLN
INTRAVENOUS | Status: DC | PRN
  Administered 2016-05-11: 100 mg via INTRAVENOUS

## 2016-05-11 MED ORDER — BUPIVACAINE-EPINEPHRINE 0.25% -1:200000 IJ SOLN
INTRAMUSCULAR | Status: DC | PRN
  Administered 2016-05-11: 20 mL

## 2016-05-11 MED ORDER — DICLOFENAC SODIUM 1 % TD GEL: 1.0000 "application " | Freq: Four times a day (QID) | TRANSDERMAL | Status: DC | PRN

## 2016-05-11 MED ORDER — FAMOTIDINE IN NACL 20-0.9 MG/50ML-% IV SOLN
20.0000 mg | Freq: Two times a day (BID) | INTRAVENOUS | Status: DC
  Administered 2016-05-11 – 2016-05-15 (×8): 20 mg via INTRAVENOUS
  Filled 2016-05-11 (×9): qty 50

## 2016-05-11 MED ORDER — FENTANYL CITRATE (PF) 100 MCG/2ML IJ SOLN
INTRAMUSCULAR | Status: AC
Start: 2016-05-11 — End: 2016-05-11
  Filled 2016-05-11: qty 2

## 2016-05-11 MED ORDER — ONDANSETRON 4 MG PO TBDP: 4.0000 mg | ORAL_TABLET | Freq: Four times a day (QID) | ORAL | Status: DC | PRN

## 2016-05-11 MED ORDER — SUGAMMADEX SODIUM 500 MG/5ML IV SOLN
INTRAVENOUS | Status: AC
  Filled 2016-05-11: qty 5

## 2016-05-11 MED ORDER — ENOXAPARIN SODIUM 40 MG/0.4ML ~~LOC~~ SOLN
40.0000 mg | SUBCUTANEOUS | Status: DC
  Administered 2016-05-12 – 2016-05-15 (×4): 40 mg via SUBCUTANEOUS
  Filled 2016-05-11 (×4): qty 0.4

## 2016-05-11 MED ORDER — PROPOFOL 10 MG/ML IV BOLUS
INTRAVENOUS | Status: AC
  Filled 2016-05-11: qty 40

## 2016-05-11 MED ORDER — HYDROMORPHONE HCL 1 MG/ML IJ SOLN
INTRAMUSCULAR | Status: AC
  Administered 2016-05-11: 1 mg via INTRAVENOUS
  Filled 2016-05-11: qty 1

## 2016-05-11 MED ORDER — BUPIVACAINE-EPINEPHRINE 0.25% -1:200000 IJ SOLN
INTRAMUSCULAR | Status: AC
  Filled 2016-05-11: qty 1

## 2016-05-11 MED ORDER — FENTANYL CITRATE (PF) 100 MCG/2ML IJ SOLN
INTRAMUSCULAR | Status: AC
  Administered 2016-05-11: 50 ug via INTRAVENOUS
  Filled 2016-05-11: qty 2

## 2016-05-11 MED ORDER — ONDANSETRON HCL 4 MG/2ML IJ SOLN
4.0000 mg | Freq: Four times a day (QID) | INTRAMUSCULAR | Status: DC | PRN
Start: 2016-05-11 — End: 2016-05-15
  Administered 2016-05-12 – 2016-05-14 (×7): 4 mg via INTRAVENOUS
  Filled 2016-05-11 (×7): qty 2

## 2016-05-11 MED ORDER — GENTAMICIN SULFATE 40 MG/ML IJ SOLN: 450.0000 mg | Freq: Once | INTRAVENOUS | Status: DC

## 2016-05-11 MED ORDER — SUGAMMADEX SODIUM 200 MG/2ML IV SOLN
INTRAVENOUS | Status: DC | PRN
  Administered 2016-05-11: 250 mg via INTRAVENOUS

## 2016-05-11 MED ORDER — ALBUTEROL SULFATE (2.5 MG/3ML) 0.083% IN NEBU: 2.5000 mg | INHALATION_SOLUTION | Freq: Four times a day (QID) | RESPIRATORY_TRACT | Status: DC | PRN

## 2016-05-11 MED ORDER — GENTAMICIN SULFATE 40 MG/ML IJ SOLN
5.0000 mg/kg | Freq: Once | INTRAVENOUS | Status: AC
  Administered 2016-05-11: 400 mg via INTRAVENOUS
  Filled 2016-05-11: qty 10

## 2016-05-11 MED ORDER — FENTANYL CITRATE (PF) 100 MCG/2ML IJ SOLN
25.0000 ug | INTRAMUSCULAR | Status: DC | PRN
  Administered 2016-05-11 (×3): 50 ug via INTRAVENOUS

## 2016-05-11 MED ORDER — FENTANYL CITRATE (PF) 250 MCG/5ML IJ SOLN
INTRAMUSCULAR | Status: AC
  Filled 2016-05-11: qty 5

## 2016-05-11 SURGICAL SUPPLY — 61 items
APPLICATOR DUAL LIQUID (MISCELLANEOUS) IMPLANT
APPLIER CLIP ROT 10 11.4 M/L (STAPLE)
APR CLP MED LRG 11.4X10 (STAPLE)
CATH ROBINSON RED A/P 14FR (CATHETERS) ×1 IMPLANT
CHLORAPREP W/TINT 26ML (MISCELLANEOUS) ×2 IMPLANT
CLIP APPLIE ROT 10 11.4 M/L (STAPLE) IMPLANT
CLIP SUT LAPRA TY ABSORB (SUTURE) ×1 IMPLANT
COVER SURGICAL LIGHT HANDLE (MISCELLANEOUS) ×2 IMPLANT
DEVICE SUTURE ENDOST 10MM (ENDOMECHANICALS) ×2 IMPLANT
DRAPE LAPAROSCOPIC ABDOMINAL (DRAPES) ×2 IMPLANT
ELECT REM PT RETURN 9FT ADLT (ELECTROSURGICAL) ×2
ELECTRODE REM PT RTRN 9FT ADLT (ELECTROSURGICAL) ×1 IMPLANT
GAUZE SPONGE 4X4 12PLY STRL (GAUZE/BANDAGES/DRESSINGS) ×2 IMPLANT
GLOVE ECLIPSE 7.5 STRL STRAW (GLOVE) ×2 IMPLANT
GLOVE SURG SS PI 7.5 STRL IVOR (GLOVE) ×2 IMPLANT
GOWN STRL REUS W/TWL XL LVL3 (GOWN DISPOSABLE) ×9 IMPLANT
HOVERMATT SINGLE USE (MISCELLANEOUS) ×1 IMPLANT
IRRIG SUCT STRYKERFLOW 2 WTIP (MISCELLANEOUS) ×2
IRRIGATION SUCT STRKRFLW 2 WTP (MISCELLANEOUS) ×1 IMPLANT
KIT BASIN OR (CUSTOM PROCEDURE TRAY) ×2 IMPLANT
LIQUID BAND (GAUZE/BANDAGES/DRESSINGS) ×1 IMPLANT
MARKER SKIN DUAL TIP RULER LAB (MISCELLANEOUS) ×1 IMPLANT
PAD POSITIONING PINK XL (MISCELLANEOUS) IMPLANT
PLUG CATH AND CAP STER (CATHETERS) ×1 IMPLANT
POSITIONER SURGICAL ARM (MISCELLANEOUS) IMPLANT
RELOAD BLUE (STAPLE) IMPLANT
RELOAD ENDO STITCH 2.0 (ENDOMECHANICALS) ×16
RELOAD STAPLE 60 2.6 WHT THN (STAPLE) IMPLANT
RELOAD STAPLER WHITE 60MM (STAPLE) IMPLANT
SCISSORS LAP 5X35 DISP (ENDOMECHANICALS) ×2 IMPLANT
SHEARS HARMONIC ACE PLUS 36CM (ENDOMECHANICALS) ×1 IMPLANT
SLEEVE ADV FIXATION 5X100MM (TROCAR) ×4 IMPLANT
SPONGE LAP 18X18 X RAY DECT (DISPOSABLE) IMPLANT
STAPLER ECHELON LONG 60 440 (INSTRUMENTS) IMPLANT
STAPLER RELOAD WHITE 60MM (STAPLE)
STAPLER VISISTAT 35W (STAPLE) IMPLANT
SUT DVC SILK 2.0X39 (SUTURE) IMPLANT
SUT DVC VICRYL PGA 2.0X39 (SUTURE) ×3 IMPLANT
SUT ETHILON 2 0 PS N (SUTURE) ×1 IMPLANT
SUT MNCRL AB 4-0 PS2 18 (SUTURE) ×2 IMPLANT
SUT RELOAD ENDO STITCH 2.0 (ENDOMECHANICALS) ×8
SUT SILK 2 0 (SUTURE)
SUT SILK 2 0 SH (SUTURE) ×2 IMPLANT
SUT SILK 2 0 SH CR/8 (SUTURE) IMPLANT
SUT SILK 2-0 18XBRD TIE 12 (SUTURE) IMPLANT
SUT SILK 3 0 (SUTURE)
SUT SILK 3 0 SH CR/8 (SUTURE) IMPLANT
SUT SILK 3-0 18XBRD TIE 12 (SUTURE) IMPLANT
SUT VIC AB 2-0 SH 27 (SUTURE)
SUT VIC AB 2-0 SH 27X BRD (SUTURE) ×1 IMPLANT
SUTURE RELOAD ENDO STITCH 2.0 (ENDOMECHANICALS) ×8 IMPLANT
TAPE CLOTH SURG 4X10 WHT LF (GAUZE/BANDAGES/DRESSINGS) ×1 IMPLANT
TOWEL OR 17X26 10 PK STRL BLUE (TOWEL DISPOSABLE) ×2 IMPLANT
TRAY FOLEY W/METER SILVER 14FR (SET/KITS/TRAYS/PACK) ×1 IMPLANT
TRAY FOLEY W/METER SILVER 16FR (SET/KITS/TRAYS/PACK) ×1 IMPLANT
TRAY LAPAROSCOPIC (CUSTOM PROCEDURE TRAY) ×2 IMPLANT
TROCAR ADV FIXATION 12X100MM (TROCAR) ×2 IMPLANT
TROCAR ADV FIXATION 5X100MM (TROCAR) ×2 IMPLANT
TROCAR BLADELESS OPT 5 100 (ENDOMECHANICALS) ×2 IMPLANT
TROCAR XCEL NON-BLD 11X100MML (ENDOMECHANICALS) IMPLANT
TUBING INSUF HEATED (TUBING) ×2 IMPLANT

## 2016-05-11 NOTE — Anesthesia Postprocedure Evaluation (Signed)
Anesthesia Post Note  Patient: [name]  Procedure(s) Performed: Procedure(s) (LRB): LAPAROSCOPIC PLACEMENT  OF FEEDING  JEJUNOSTOMY TUBE (N/A)  Patient location during evaluation: PACU Anesthesia Type: General Level of consciousness: awake Pain management: pain level controlled Respiratory status: spontaneous breathing Cardiovascular status: stable Anesthetic complications: no    Last Vitals:  Vitals:   05/11/16 1730 05/11/16 1745  BP: (!) 142/92 (!) 143/86  Pulse: 86 85  Resp: 11 14  Temp:      Last Pain:  Vitals:   05/11/16 1745  TempSrc:   PainSc: Asleep                 EDWARDS,Cecilia Vancleve

## 2016-05-11 NOTE — Progress Notes (Signed)
Call to Dr Excell Seltzer re need of orders and allergy to PCN w reaction of hives.  New orders given

## 2016-05-11 NOTE — Interval H&P Note (Signed)
History and Physical Interval Note:  05/11/2016 2:39 PM  [name]  has presented today for surgery, with the diagnosis of postoperative nausea and vomiting status post sleeve gastrectomy  The various methods of treatment have been discussed with the patient and family. After consideration of risks, benefits and other options for treatment, the patient has consented to  Procedure(s): [place] (N/A) as a surgical intervention .  The patient's history has been reviewed, patient examined, no change in status, stable for surgery.  I have reviewed the patient's chart and labs.  Questions were answered to the patient's satisfaction.     [name] T

## 2016-05-11 NOTE — Anesthesia Procedure Notes (Addendum)
Procedure Name: Intubation Date/Time: 05/11/2016 3:36 PM Performed by: Danley Danker L Patient Re-evaluated:Patient Re-evaluated prior to inductionOxygen Delivery Method: Circle system utilized Preoxygenation: Pre-oxygenation with 100% oxygen Intubation Type: IV induction, Rapid sequence and Cricoid Pressure applied Laryngoscope Size: Miller and 2 Grade View: Grade I Tube type: Oral Tube size: 7.0 mm Number of attempts: 1 Airway Equipment and Method: Stylet Placement Confirmation: ETT inserted through vocal cords under direct vision,  positive ETCO2 and breath sounds checked- equal and bilateral Secured at: 22 cm Tube secured with: Tape Dental Injury: Teeth and Oropharynx as per pre-operative assessment

## 2016-05-11 NOTE — Progress Notes (Signed)
Call to Dr Excell Seltzer re allergy to Cipro and can not give Levaquin.

## 2016-05-11 NOTE — Transfer of Care (Signed)
Immediate Anesthesia Transfer of Care Note  Patient: [name]  Procedure(s) Performed: Procedure(s): LAPAROSCOPIC PLACEMENT  OF FEEDING  JEJUNOSTOMY TUBE (N/A)  Patient Location: PACU  Anesthesia Type:General  Level of Consciousness: awake, alert  and oriented  Airway & Oxygen Therapy: Patient Spontanous Breathing and Patient connected to face mask oxygen  Post-op Assessment: Report given to RN and Post -op Vital signs reviewed and stable  Post vital signs: Reviewed and stable  Last Vitals:  Vitals:   05/11/16 1225  BP: (!) 127/93  Pulse: 89  Resp: 20  Temp: 36.9 C    Last Pain:  Vitals:   05/11/16 1506  TempSrc:   PainSc: 3       Patients Stated Pain Goal: 6 (88/82/80 0349)  Complications: No apparent anesthesia complications

## 2016-05-11 NOTE — Anesthesia Preprocedure Evaluation (Signed)
Anesthesia Evaluation  Patient identified by MRN, date of birth, ID band Patient awake    Reviewed: Allergy & Precautions, NPO status , Patient's Chart, lab work & pertinent test results  Airway Mallampati: II  TM Distance: >3 FB Neck ROM: Full    Dental  (+) Missing, Dental Advisory Given,    Pulmonary former smoker,    breath sounds clear to auscultation       Cardiovascular  Rhythm:Regular Rate:Normal     Neuro/Psych    GI/Hepatic   Endo/Other    Renal/GU      Musculoskeletal   Abdominal   Peds  Hematology   Anesthesia Other Findings   Reproductive/Obstetrics                             Anesthesia Physical Anesthesia Plan  ASA: III  Anesthesia Plan: General   Post-op Pain Management:    Induction: Intravenous  Airway Management Planned: Oral ETT  Additional Equipment:   Intra-op Plan:   Post-operative Plan: Extubation in OR  Informed Consent: I have reviewed the patients History and Physical, chart, labs and discussed the procedure including the risks, benefits and alternatives for the proposed anesthesia with the patient or authorized representative who has indicated his/her understanding and acceptance.   Dental advisory given  Plan Discussed with: CRNA and Anesthesiologist  Anesthesia Plan Comments:         Anesthesia Quick Evaluation

## 2016-05-11 NOTE — Op Note (Signed)
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 She has had persistent nausea and vomiting and intolerance of oral intake essentially since surgery. She has had multiple studies including CT scans, upper GI series and endoscopy that does not show any identifiable problem with her sleeve such as obstruction or twisting or ulceration or hernia. She however feels that her current situation is intolerable due to weakness and inability to eat and as a temporizing maneuver to allow further time to adjust to the surgery I have recommended placement of a feeding jejunostomy tube laparoscopically. We discussed the procedure and risks of infection and leakage and tube displacement as well as the issues and will be involved with care of the tube and feeding and she understands and agrees to proceed.    Procedure Detail:  Patient Was brought to the operating room, placed in supine position on the operating table, and general endotracheal anesthesia induced. She received preoperative IV antibiotics. PAS were in place. The abdomen was widely sterilely prepped and draped. Patient timeout was performed and correct procedure verified. Access was obtained with a 5 mm Optiview trocar in the left upper quadrant at the planned site of the jejunostomy tube and at a previous incision. Following this a 5 mm trocar was placed laterally in the right upper quadrant, a 12 mm trocar in the right midabdomen and a 5 mm trocar just to the left of the umbilicus all under direct vision using previous laparoscopic  incision sites. Initially the patient was placed in reverse Trendelenburg and we carefully examined the sleeve which did not show any identifiable stricture or twisting or kinking. There was no proximal dilatation. No evidence of inflammation. The small bowel appeared normal. The ligament of Treitz was identified and I measured 40 cm from the ligament of Treitz. At this point a stay suture of 2-0 silk was placed and the bowel. A 14 French red rubber catheter with extra side holes was introduced into the abdomen through the left upper quadrant trocar site passing an instrument through the 5 mm trocar and bringing the tube into the abdomen and removing the trocar. Using an Endo Close device through the 5 mm port site the silk stay suture was brought up through the abdominal wall and held close to the abdominal wall for traction and fixation. I then made a small enterotomy in the antimesenteric border with cautery scissors. The tube was then introduced through this enterotomy and passed distally 10-15 cm. A 2-0 silk pursestring suture was then placed around the tube at the enterotomy site and tied securely. 3 additional 2-0 silk sutures were then used circumferentially around this entrance site and used to secure the bowel up to the anterior abdominal wall circumferentially and the initially placed suture was also tightened and secured. The bowel was then fixed into position both proximally and distally with additional 2-0 silk sutures to prevent twisting. At this point I irrigated the tube with 40 mL of saline and it irrigated easily and the tip of the tube was seen to be about 10-15 cm distal to the enterotomy site. No leakage. There was no twisting of the bowel. No bleeding or evidence of trocar  injury. All CO2 was evacuated and trochars removed. The tube was secured to the skin with 2 2-0 nylon sutures. It was securely taped in place with a catheter plug. Skin incisions were closed with subcuticular Monocryl and  Liquiban. Sponge needle and instrument counts were correct.    Findings: As above  Estimated Blood Loss:  Minimal         Drains: none  Blood Given: none          Specimens: None        Complications:  * No complications entered in OR log *         Disposition: PACU - hemodynamically stable.         Condition: stable

## 2016-05-12 ENCOUNTER — Encounter (HOSPITAL_COMMUNITY): Payer: Self-pay | Admitting: General Surgery

## 2016-05-12 LAB — CBC
HCT: 33.1 % — ABNORMAL LOW (ref 36.0–46.0)
Hemoglobin: 10.6 g/dL — ABNORMAL LOW (ref 12.0–15.0)
MCH: 25.6 pg — ABNORMAL LOW (ref 26.0–34.0)
MCHC: 32 g/dL (ref 30.0–36.0)
MCV: 80 fL (ref 78.0–100.0)
PLATELETS: 248 10*3/uL (ref 150–400)
RBC: 4.14 MIL/uL (ref 3.87–5.11)
RDW: 24.7 % — AB (ref 11.5–15.5)
WBC: 9.8 10*3/uL (ref 4.0–10.5)

## 2016-05-12 LAB — BASIC METABOLIC PANEL
Anion gap: 6 (ref 5–15)
BUN: 9 mg/dL (ref 6–20)
CO2: 22 mmol/L (ref 22–32)
CREATININE: 0.7 mg/dL (ref 0.44–1.00)
Calcium: 8.9 mg/dL (ref 8.9–10.3)
Chloride: 110 mmol/L (ref 101–111)
Glucose, Bld: 155 mg/dL — ABNORMAL HIGH (ref 65–99)
Potassium: 4.1 mmol/L (ref 3.5–5.1)
SODIUM: 138 mmol/L (ref 135–145)

## 2016-05-12 LAB — GLUCOSE, CAPILLARY
GLUCOSE-CAPILLARY: 102 mg/dL — AB (ref 65–99)
GLUCOSE-CAPILLARY: 109 mg/dL — AB (ref 65–99)
GLUCOSE-CAPILLARY: 87 mg/dL (ref 65–99)

## 2016-05-12 MED ORDER — ADULT MULTIVITAMIN LIQUID CH
15.0000 mL | Freq: Every day | ORAL | Status: DC
  Administered 2016-05-12 – 2016-05-15 (×4): 15 mL
  Filled 2016-05-12 (×4): qty 15

## 2016-05-12 MED ORDER — VITAL HIGH PROTEIN PO LIQD
1000.0000 mL | ORAL | Status: DC
  Administered 2016-05-12: 1000 mL
  Filled 2016-05-12 (×2): qty 1000

## 2016-05-12 MED ORDER — PROMETHAZINE HCL 25 MG/ML IJ SOLN
12.5000 mg | Freq: Four times a day (QID) | INTRAMUSCULAR | Status: DC | PRN
  Administered 2016-05-12 – 2016-05-15 (×2): 12.5 mg via INTRAVENOUS
  Filled 2016-05-12 (×2): qty 1

## 2016-05-12 NOTE — Progress Notes (Signed)
Patient called me to her room. States just vomited-shows me 40cc creamy beige liquid in cup with green tinge. States is very nauseated . Dr Excell Seltzer notified. Order noted.

## 2016-05-12 NOTE — Progress Notes (Signed)
Patient ID: [name], female   DOB: [birth year], 40 y.o.   MRN: [id number] 1 Day Post-Op  Subjective: Sore but no severe pain. Has a dry throat and would like some liquids to drink.  Objective: Vital signs in last 24 hours: Temp:  [97.6 F (36.4 C)-98.5 F (36.9 C)] 97.7 F (36.5 C) (08/15 0400) Pulse Rate:  [59-89] 59 (08/15 0400) Resp:  [11-20] 16 (08/15 0400) BP: (105-144)/(68-94) 113/68 (08/15 0400) SpO2:  [96 %-100 %] 96 % (08/15 0400) Weight:  [107.7 kg (237 lb 8 oz)] 107.7 kg (237 lb 8 oz) (08/14 1225)    Intake/Output from previous day: 08/14 0701 - 08/15 0700 In: 2835 [I.V.:2725; IV Piggyback:110] Out: 25 [Blood:25] Intake/Output this shift: Total I/O In: 20 [Other:20] Out: -   General appearance: alert, cooperative and no distress GI: mild to moderate tenderness in the left upper quadrant and around the feeding tube. Incision/Wound: incisions and dressing dry and intact  Lab Results:   Recent Labs  05/12/16 0453  WBC 9.8  HGB 10.6*  HCT 33.1*  PLT 248   BMET  Recent Labs  05/12/16 0453  NA 138  K 4.1  CL 110  CO2 22  GLUCOSE 155*  BUN 9  CREATININE 0.70  CALCIUM 8.9     Studies/Results: No results found.  Anti-infectives: Anti-infectives    Start     Dose/Rate Route Frequency Ordered Stop   05/11/16 1400  gentamicin (GARAMYCIN) 400 mg in dextrose 5 % 100 mL IVPB     5 mg/kg  80 kg (Adjusted) 220 mL/hr over 30 Minutes Intravenous  Once 05/11/16 1355 05/11/16 1608   05/11/16 1345  gentamicin (GARAMYCIN) 450 mg in dextrose 5 % 100 mL IVPB  Status:  Discontinued    Comments:  Send to OR   450 mg 111.3 mL/hr over 60 Minutes Intravenous  Once 05/11/16 1333 05/11/16 1355      Assessment/Plan: s/p Procedure(s): LAPAROSCOPIC PLACEMENT  OF FEEDING  JEJUNOSTOMY TUBE Stable postoperatively. Start clear liquid diet. Consult pharmacy to start tube feedingat minimal right   LOS: 1 day    Lorel Lembo T 05/12/2016

## 2016-05-12 NOTE — Progress Notes (Signed)
[place] for assistance with Jejunostomy Tube Feeding Indication: intractable n/v s/p gastric bypass   Patient Measurements: Height: 5' 7"  (170.2 cm) Weight: 237 lb 8 oz (107.7 kg) IBW/kg (Calculated) : 61.6  Intake/Output from previous day: 08/14 0701 - 08/15 0700 In: 2835 [I.V.:2725; IV Piggyback:110] Out: 25 [Blood:25] Intake/Output from this shift: Total I/O In: 20 [Other:20] Out: 0   Medical History: Past Medical History:  Diagnosis Date  . Anal fissure - posterior 10/16/2014  . Anxiety    doesn't take anything  . Asthma    has Albuterol inhaler as needed  . Bronchitis   . Clostridium difficile infection 04/20/2012  . Dehydration   . Depression    doesn't take any meds  . Family history of adverse reaction to anesthesia    pt mom gets sick  . GERD (gastroesophageal reflux disease)    takes Pantoprazole daily  . Headache   . Hiatal hernia   . History of blood transfusion    last transfusion was 04/04/2016=Benadryl was given d/t itching. States she always itches with transfusion.   Marland Kitchen History of bronchitis    > 2 yrs ago  . History of colon polyps    benign  . History of migraine    last one 05/01/16  . History of urinary tract infection   . IDA (iron deficiency anemia)   . Internal and external bleeding hemorrhoids 06/11/2014  . Joint pain   . Joint swelling   . Left sided chronic colitis - segmental 06/11/2014  . Motion sickness   . Nausea    takes Zofran as needed  . Obesity   . Oligouria   . Osteoarthritis    back  . Pneumonia    hx of-yrs ago  . Postoperative nausea and vomiting 01/21/2016  . TOBACCO USER 10/02/2009   Qualifier: Diagnosis of  By: Dimas Millin MD, Ellard Artis    . Transfusion history    last transfusion 6'16   . UC (ulcerative colitis) ([place])    supposed to be taking Lialda and Bentyl but has been off since gastric sleeve  . Ulcerative colitis ([place])   . Vertigo    doesn't take any meds    Assessment: 62  yoF with intractable N/V three months s/p lap gastric sleeve bypass. Seen by surgery and jejunostomy tube placed 8/14  Surgery would like pharmacy to ensure that feeding is as thin as possible so as not to clog J tube.  Goal of Therapy:  Prevent blockage of feeding tube  Plan:   Discussed issue with Nutrition and will defer decision regarding choice of formula to them. They do not recommend dilution of J-tube feeds d/t concerns for infection. Pharmacy will follow peripherally and will assist with management of enteral feeding as issues arise.  Thank you for this interesting consult  Reuel Boom, PharmD, BCPS Pager: [phone] 05/12/2016, 1:47 PM

## 2016-05-12 NOTE — Progress Notes (Signed)
Initial Nutrition Assessment  DOCUMENTATION CODES:   Severe malnutrition in context of chronic illness, Obesity unspecified  INTERVENTION:   Monitor magnesium, potassium, and phosphorus daily for at least 3 days, MD to replete as needed, as pt is at risk for refeeding syndrome given severe malnutrition, PO intake poor for >3 months, mild fat depletion and severe muscle depletion.  -Initiate Vital High Protein @ 10 ml/hr via J-tube. Will assess prior to advancement. (Provides 240 kcal, 21g protein and 201 ml H2O.  -RD to order liquid Multivitamin with minerals via j-tube -Recommend B12 shot be obtained. Pt reports missing her last dose d/t knee surgery. -Recommend 100 mg of thiamine daily for at least 3 days.  Goal rate to meet ~50% of kcal needs and 100% of protein needs: Vital HP @ 40 ml/hr. Tube feeding regimen provides 960 kcal (53% of needs), 84 grams of protein, and 803 ml of H2O.   RD to continue to monitor  NUTRITION DIAGNOSIS:   Malnutrition related to chronic illness, altered GI function as evidenced by energy intake < or equal to 75% for > or equal to 1 month, mild depletion of body fat, severe depletion of muscle mass.  GOAL:   Patient will meet greater than or equal to 90% of their needs  MONITOR:   PO intake, Labs, Weight trends, TF tolerance, Skin, I & O's  REASON FOR ASSESSMENT:   Consult Enteral/tube feeding initiation and management  ASSESSMENT:   40 year old female just over 3 months following laparoscopic sleeve gastrectomy. She has had persistent nausea and vomiting and intolerance of oral intake essentially since surgery. She has had multiple studies including CT scans, upper GI series and endoscopy that does not show any identifiable problem with her sleeve such as obstruction or twisting or ulceration or hernia. She however feels that her current situation is intolerable due to weakness and inability to eat and as a temporizing maneuver to allow  further time to adjust to the surgery I have recommended placement of a feeding jejunostomy tube laparoscopically.  8/14: s/p LAPAROSCOPIC PLACEMENT  OF FEEDING  JEJUNOSTOMY TUBE  Pt has previously been discussed with outpatient bariatric RD who has been following the pt since before her gastric sleeve surgery in April. Pt has consumed less than desired amounts of liquids and protein post-op and was unable to advance diet as recommended given persistent N/V.  Pt reports she has been unable to take any multi-vitamins or calcium supplements orally. She has been receiving B12 shots monthly but reports missing her last dose d/t her knee surgery on 8/10. She reports consuming on average 8-12 oz of fluid daily (mostly apple juice). She states apple juice is the only liquid she has tolerated. RN provided pt with some juice during RD visit. Pt states food tends to get stuck in her esophagus going down. She is having foaming of the mouth and feelings of "slime" with intake. Pt denies any chewing or oral swallowing difficulty. Pt reports constipation, last BM was 8/14 but prior to that it was 8/4.  Patient continues to have leg tingling and numbness which was reported PTA. Thiamine levels were recently checked and found to be low. She reports having received thiamine shots PTA. Pt with iron deficiency anemia, brittle, split nails noticed upon physical assessment. Nails of a pink and sometimes bluish tint.  During assessment, pt c/o muscle cramping in her right arm. States it is a "sharp pain" in her shoulder. Pt became upset during visit when son left the  room. Pt reports high stress level.   Per weight history, pt has lost 64 lb since surgery on 4/18 (22% wt loss x 4 months, significant for time frame). Unable to determine how much weight loss is contributed to malabsorption from surgery and from poor PO intake. Nutrition-Focused physical exam completed. Findings are mild fat depletion, severe muscle depletion,  and no edema.   RD to follow-up to perform micronutrient NFPE.  Pt with new J-tube. RD consulted to begin feedings. MD requests "tube feeding and multi-vitamin recommendations to use via feeding tube. Tube feeding needs to be elemental or thin so will not clog jejunostomy tube. Goal is for moderate supplementation, not full support. Patient also needs instructions in care and flushing of feeding tube".   RD to order Vital High Protein formula given patient's altered GI function and intolerance to food and liquids over the past 3 months. Vital HP is low residue and contains MCT, LCT and EPA, DHA.  Suspect pt to not meet protein needs in oral diet to start with. Tube feeding goal rate of 40 ml/hr will provide ~50% of kcal needs and 100% of protein needs.  Patient is at high refeeding risk. Will initiate Vital HP at 10 ml/hr. Will assess prior to advancement 8/16.  Medications: D5 and .9% NaCl w/ KCl infusion at 100 ml/hr -provides 408 kcal, Zofran IV PRN Labs reviewed: Glucose: 155  Thiamine level from a few weeks ago: 48 nmol/L -low  Diet Order:  Diet bariatric clear liquid Room service appropriate? Yes; Fluid consistency: Thin  Skin:  Wound (see comment) (abdominal incision 8/14)  Last BM:  Per pt: 8/14 at 1100  Height:   Ht Readings from Last 1 Encounters:  05/11/16 5' 7"  (1.702 m)    Weight:   Wt Readings from Last 1 Encounters:  05/11/16 237 lb 8 oz (107.7 kg)    Ideal Body Weight:  61.4 kg  BMI:  Body mass index is 37.2 kg/m.  Estimated Nutritional Needs:   Kcal:  1800-2000  Protein:  85-95g  Fluid:  2L/day  EDUCATION NEEDS:   Education needs addressed  Clayton Bibles, MS, RD, LDN Pager: [phone] After Hours Pager: [phone]

## 2016-05-13 LAB — GLUCOSE, CAPILLARY
GLUCOSE-CAPILLARY: 88 mg/dL (ref 65–99)
Glucose-Capillary: 93 mg/dL (ref 65–99)
Glucose-Capillary: 73 mg/dL (ref 65–99)
Glucose-Capillary: 82 mg/dL (ref 65–99)
Glucose-Capillary: 69 mg/dL (ref 65–99)
Glucose-Capillary: 72 mg/dL (ref 65–99)
Glucose-Capillary: 81 mg/dL (ref 65–99)

## 2016-05-13 MED ORDER — VITAL HIGH PROTEIN PO LIQD
1000.0000 mL | ORAL | Status: DC
  Administered 2016-05-13: 1000 mL
  Filled 2016-05-13 (×2): qty 1000

## 2016-05-13 MED ORDER — CYANOCOBALAMIN 1000 MCG/ML IJ SOLN
1000.0000 ug | Freq: Once | INTRAMUSCULAR | Status: AC
  Administered 2016-05-13: 1000 ug via INTRAMUSCULAR
  Filled 2016-05-13: qty 1

## 2016-05-13 MED ORDER — THIAMINE HCL 100 MG/ML IJ SOLN
100.0000 mg | Freq: Every day | INTRAMUSCULAR | Status: DC
  Administered 2016-05-13 – 2016-05-15 (×3): 100 mg via INTRAVENOUS
  Filled 2016-05-13: qty 2

## 2016-05-13 NOTE — Progress Notes (Signed)
2 Days Post-Op  Subjective: Doing pretty well today. Pain is better. One episode of nausea and vomited once some phlegm-like material yesterday afternoon but no significant nausea or vomiting since. Tolerating tube feeding at 10 mL/h.  Objective: Vital signs in last 24 hours: Temp:  [97.9 F (36.6 C)-98.6 F (37 C)] 98.6 F (37 C) (08/16 0948) Pulse Rate:  [49-65] 62 (08/16 0948) Resp:  [16-18] 16 (08/16 0948) BP: (110-142)/(66-98) 114/73 (08/16 0948) SpO2:  [95 %-100 %] 99 % (08/16 0948) Weight:  [241 lb 11.2 oz (109.6 kg)-244 lb 4.8 oz (110.8 kg)] 244 lb 4.8 oz (110.8 kg) (08/16 1300)    Intake/Output from previous day: 08/15 0701 - 08/16 0700 In: 2444.2 [I.V.:2400; NG/GT:24.2] Out: 0  Intake/Output this shift: Total I/O In: 550 [I.V.:400; IV Piggyback:150] Out: -   General appearance: alert, cooperative and no distress GI: mild tenderness around jejunostomy tube, improved from yesterday Incision/Wound: clean and dry  Lab Results:   Recent Labs  05/12/16 0453  WBC 9.8  HGB 10.6*  HCT 33.1*  PLT 248   BMET  Recent Labs  05/12/16 0453  NA 138  K 4.1  CL 110  CO2 22  GLUCOSE 155*  BUN 9  CREATININE 0.70  CALCIUM 8.9     Studies/Results: No results found.  Anti-infectives: Anti-infectives    Start     Dose/Rate Route Frequency Ordered Stop   05/11/16 1400  gentamicin (GARAMYCIN) 400 mg in dextrose 5 % 100 mL IVPB     5 mg/kg  80 kg (Adjusted) 220 mL/hr over 30 Minutes Intravenous  Once 05/11/16 1355 05/11/16 1608   05/11/16 1345  gentamicin (GARAMYCIN) 450 mg in dextrose 5 % 100 mL IVPB  Status:  Discontinued    Comments:  Send to OR   450 mg 111.3 mL/hr over 60 Minutes Intravenous  Once 05/11/16 1333 05/11/16 1355      Assessment/Plan: s/p Procedure(s): LAPAROSCOPIC PLACEMENT  OF FEEDING  JEJUNOSTOMY TUBE Stable postoperatively. Appreciate dietary input. Will monitor in hospital for the next 48 hours to follow lab work and gradually  increase her tube feeding toward goal rate. b-12 and thiamine replacement ordered.   LOS: 1 day    Schylar Allard T 05/13/2016

## 2016-05-13 NOTE — Progress Notes (Signed)
Nutrition Follow-up  DOCUMENTATION CODES:   Severe malnutrition in context of chronic illness, Obesity unspecified  INTERVENTION:   Monitor magnesium, potassium, and phosphorus daily for at least 3 days, MD to replete as needed, as pt is at risk for refeeding syndrome given severe malnutrition, PO intake poor for >3 months, mild fat depletion and severe muscle depletion.  -Continue Vital High Protein @ 10 ml/hr via J-tube. Advance at 1400 to 20 ml/hr. (Provides 480 kcal, 42g protein and 401 ml H2O)  -Continue liquid Multivitamin with minerals via j-tube -Recommend 100 mg of thiamine daily for at least 3 days.  Goal rate to meet ~50% of kcal needs and 100% of protein needs: Vital HP @ 40 ml/hr. Tube feeding regimen provides 960 kcal (53% of needs), 84 grams of protein, and 803 ml of H2O.   RD to continue to monitor and follow-up to complete micronutrient nutrition focused physical exam.  NUTRITION DIAGNOSIS:   Malnutrition related to chronic illness, altered GI function as evidenced by energy intake < or equal to 75% for > or equal to 1 month, mild depletion of body fat, severe depletion of muscle mass.  Ongoing.  GOAL:   Patient will meet greater than or equal to 90% of their needs  Progressing slowly.  MONITOR:   PO intake, Labs, Weight trends, TF tolerance, Skin, I & O's  ASSESSMENT:   40 year old female just over 3 months following laparoscopic sleeve gastrectomy. She has had persistent nausea and vomiting and intolerance of oral intake essentially since surgery. She has had multiple studies including CT scans, upper GI series and endoscopy that does not show any identifiable problem with her sleeve such as obstruction or twisting or ulceration or hernia. She however feels that her current situation is intolerable due to weakness and inability to eat and as a temporizing maneuver to allow further time to adjust to the surgery I have recommended placement of a feeding  jejunostomy tube laparoscopically.   Patient reports one incident of vomiting yesterday which was beige and green in color. Since that initial episode, pt has been tolerating tube feeding with no issues. She has not consumed any clear liquids. She reports feeling afraid to start foods and liquids too soon.  Per surgery note, labs have been ordered. Pt's B12 and thiamine injections have been ordered as well.  Vital HP is currently running at 10 ml/hr -providing 240 kcal and 21g protein. Pt reports improvement in her headaches since tube feeding has started. Will advance to 20 ml/hr today.  Medications: B12 injection once, Thiamine injection 100 mg daily, D5 and .9% NaCl w/ KCl infusion at 100 ml/hr -provides 408 kcal, Zofran injection PRN Labs reviewed: CBGs WNL  Diet Order:  Diet bariatric clear liquid Room service appropriate? Yes; Fluid consistency: Thin  Skin:  Wound (see comment) (abdominal incision 8/14)  Last BM:  Per pt: 8/14 at 1100  Height:   Ht Readings from Last 1 Encounters:  05/11/16 5' 7"  (1.702 m)    Weight:   Wt Readings from Last 1 Encounters:  05/13/16 244 lb 4.8 oz (110.8 kg)    Ideal Body Weight:  61.4 kg  BMI:  Body mass index is 38.26 kg/m.  Estimated Nutritional Needs:   Kcal:  1800-2000  Protein:  85-95g  Fluid:  2L/day  EDUCATION NEEDS:   Education needs addressed  Clayton Bibles, MS, RD, LDN Pager: [phone] After Hours Pager: [phone]

## 2016-05-14 LAB — CBC
HEMATOCRIT: 39.2 % (ref 36.0–46.0)
Hemoglobin: 12.3 g/dL (ref 12.0–15.0)
MCH: 25.2 pg — ABNORMAL LOW (ref 26.0–34.0)
MCHC: 31.4 g/dL (ref 30.0–36.0)
MCV: 80.2 fL (ref 78.0–100.0)
PLATELETS: 220 10*3/uL (ref 150–400)
RBC: 4.89 MIL/uL (ref 3.87–5.11)
RDW: 24.7 % — AB (ref 11.5–15.5)
WBC: 11.1 10*3/uL — AB (ref 4.0–10.5)

## 2016-05-14 LAB — GLUCOSE, CAPILLARY
GLUCOSE-CAPILLARY: 86 mg/dL (ref 65–99)
GLUCOSE-CAPILLARY: 83 mg/dL (ref 65–99)
GLUCOSE-CAPILLARY: 94 mg/dL (ref 65–99)
Glucose-Capillary: 72 mg/dL (ref 65–99)
Glucose-Capillary: 83 mg/dL (ref 65–99)
Glucose-Capillary: 84 mg/dL (ref 65–99)
Glucose-Capillary: 93 mg/dL (ref 65–99)

## 2016-05-14 LAB — BASIC METABOLIC PANEL
ANION GAP: 8 (ref 5–15)
BUN: <5 mg/dL — ABNORMAL LOW (ref 6–20)
CALCIUM: 8.7 mg/dL — AB (ref 8.9–10.3)
CO2: 24 mmol/L (ref 22–32)
Chloride: 106 mmol/L (ref 101–111)
Creatinine, Ser: 0.74 mg/dL (ref 0.44–1.00)
Glucose, Bld: 70 mg/dL (ref 65–99)
POTASSIUM: 4 mmol/L (ref 3.5–5.1)
Sodium: 138 mmol/L (ref 135–145)

## 2016-05-14 LAB — PHOSPHORUS: PHOSPHORUS: 3.4 mg/dL (ref 2.5–4.6)

## 2016-05-14 LAB — MAGNESIUM: Magnesium: 1.6 mg/dL — ABNORMAL LOW (ref 1.7–2.4)

## 2016-05-14 MED ORDER — MAGNESIUM SULFATE 2 GM/50ML IV SOLN
2.0000 g | Freq: Once | INTRAVENOUS | Status: AC
  Administered 2016-05-14: 2 g via INTRAVENOUS
  Filled 2016-05-14: qty 50

## 2016-05-14 MED ORDER — VITAL HIGH PROTEIN PO LIQD
1000.0000 mL | ORAL | Status: DC
  Administered 2016-05-14: 1000 mL
  Filled 2016-05-14 (×2): qty 1000

## 2016-05-14 NOTE — Progress Notes (Signed)
3 Days Post-Op  Subjective: Some discomfort around j tube, not severe.  No other C/O  Objective: Vital signs in last 24 hours: Temp:  [98.2 F (36.8 C)-98.6 F (37 C)] 98.5 F (36.9 C) (08/17 0547) Pulse Rate:  [62-81] 81 (08/17 0547) Resp:  [16-18] 16 (08/17 0547) BP: (114-140)/(67-94) 117/78 (08/17 0547) SpO2:  [97 %-100 %] 100 % (08/17 0547) Weight:  [110 lb 8 oz (50.1 kg)-244 lb 4.8 oz (110.8 kg)] 110 lb 8 oz (50.1 kg) (08/17 0500)    Intake/Output from previous day: 08/16 0701 - 08/17 0700 In: 2200 [I.V.:2000; IV Piggyback:200] Out: -  Intake/Output this shift: No intake/output data recorded.  General appearance: alert, cooperative and no distress GI: Mild tenderness around j tube site  Lab Results:   Recent Labs  05/12/16 0453 05/14/16 0519  WBC 9.8 11.1*  HGB 10.6* 12.3  HCT 33.1* 39.2  PLT 248 220   BMET  Recent Labs  05/12/16 0453 05/14/16 0519  NA 138 138  K 4.1 4.0  CL 110 106  CO2 22 24  GLUCOSE 155* 70  BUN 9 <5*  CREATININE 0.70 0.74  CALCIUM 8.9 8.7*     Studies/Results: No results found.  Anti-infectives: Anti-infectives    Start     Dose/Rate Route Frequency Ordered Stop   05/11/16 1400  gentamicin (GARAMYCIN) 400 mg in dextrose 5 % 100 mL IVPB     5 mg/kg  80 kg (Adjusted) 220 mL/hr over 30 Minutes Intravenous  Once 05/11/16 1355 05/11/16 1608   05/11/16 1345  gentamicin (GARAMYCIN) 450 mg in dextrose 5 % 100 mL IVPB  Status:  Discontinued    Comments:  Send to OR   450 mg 111.3 mL/hr over 60 Minutes Intravenous  Once 05/11/16 1333 05/11/16 1355      Assessment/Plan: s/p Procedure(s): LAPAROSCOPIC PLACEMENT  OF FEEDING  JEJUNOSTOMY TUBE Stable Mg slightly low-replace Increase TF to 30cc   LOS: 2 days    Aleksa Catterton T 05/14/2016

## 2016-05-14 NOTE — Progress Notes (Signed)
Post-op bariatric surgery patient with feeding tube placement, checked in on patient to determine any needs, discussed plan and patient goals.  Patient concerned about her children while she is in the hospital.  Provided support and encouragement, will continue to monitor.

## 2016-05-14 NOTE — Progress Notes (Addendum)
Nutrition Follow-up  DOCUMENTATION CODES:   Severe malnutrition in context of chronic illness, Obesity unspecified  INTERVENTION:   Monitor magnesium, potassium, and phosphorus daily for at least 3 days, MD to replete as needed, as pt is at risk for refeeding syndrome given severe malnutrition, PO intake poor for >3 months, mild fat depletion and severe muscle depletion.  -Continue Vital High Protein@ 4m/hr via J-tube. Advance at 1400 to 30 ml/hr. (Provides 720 kcal, 63g protein and 836 ml H2O)  -Recommend ordering serum vitamin labs to check for further deficiencies -Continue liquid Multivitamin with minerals via j-tube -Recommend 100 mg of thiamine daily for at least 3 days.  Goal rate to meet ~50% of kcal needs and 100% of protein needs: Vital HP @ 40 ml/hr. Tube feeding regimen provides 960kcal (53% of needs), 84grams of protein, and 8014mof H2O.  (This regimen is preferable if patient can also consume calories PO.)  RD to continue to monitor  NUTRITION DIAGNOSIS:   Malnutrition related to chronic illness, altered GI function as evidenced by energy intake < or equal to 75% for > or equal to 1 month, mild depletion of body fat, severe depletion of muscle mass.  Ongoing.  GOAL:   Patient will meet greater than or equal to 90% of their needs  Progressing.  MONITOR:   PO intake, Labs, Weight trends, TF tolerance, Skin, I & O's  REASON FOR ASSESSMENT:   Consult Enteral/tube feeding initiation and management  ASSESSMENT:   [age]ear old female just over 3 months following laparoscopic sleeve gastrectomy. She has had persistent nausea and vomiting and intolerance of oral intake essentially since surgery. She has had multiple studies including CT scans, upper GI series and endoscopy that does not show any identifiable problem with her sleeve such as obstruction or twisting or ulceration or hernia. She however feels that her current situation is intolerable due to  weakness and inability to eat and as a temporizing maneuver to allow further time to adjust to the surgery I have recommended placement of a feeding jejunostomy tube laparoscopically.   Patient reports feeling better. She still complains of headaches. Currently receiving Vital HP @ 20 ml/hr and tolerating. Will advance at 1400 to 30 ml/hr. This will provide her with 720 kcal and 63 g protein.  Micronutrient NFPE was completed:  Patient reports no further muscle cramping. She has trouble concentrating. She reports trouble with her memory. These findings can possibly be associated with thiamine and B12 deficiency.  RD assessed the following areas: Nails: Noted concave, spoon shaped nails on some fingers, ridges and brittleness. These findings could be associated iron, protein, folate and magnesium deficiencies.  Mouth: Noted paleness of tongue. Patient reports that when she brushes her teeth her gums tend to bleed. These findings could be associated with vitamin C, niacin, folate, zinc, and vitamin D deficiencies. Hair: Pt reports hair loss Face: no findings Lips: no findings Skin: dark coloring of hair follicles noted  Recommend ordering serum vitamin labs to check for further deficiencies.  Medications: Liquid Multivitamin with minerals daily, Mg sulfate IV today, Thiamine injection daily, D5 and .9% NaCl w/ KCl infusion at 50 ml/hr -provides 204 kcal Labs reviewed: Low Mg K/Phos WNL  Diet Order:  Diet bariatric clear liquid Room service appropriate? Yes; Fluid consistency: Thin  Skin:  Wound (see comment) (abdominal incision 8/14)  Last BM:  Per pt: 8/14 at 1100  Height:   Ht Readings from Last 1 Encounters:  05/11/16 5' 7"  (1.702 m)  Weight:   Wt Readings from Last 1 Encounters:  05/14/16 110 lb 8 oz (50.1 kg)  **error, Pt weighed 244 lb 8/16  Ideal Body Weight:  61.4 kg  BMI:  Body mass index is 17.31 kg/m.  Estimated Nutritional Needs:   Kcal:   1800-2000  Protein:  85-95g  Fluid:  2L/day  EDUCATION NEEDS:   Education needs addressed  Clayton Bibles, MS, RD, LDN Pager: [phone] After Hours Pager: [phone]

## 2016-05-15 LAB — CBC
HEMATOCRIT: 31.8 % — AB (ref 36.0–46.0)
HEMOGLOBIN: 10 g/dL — AB (ref 12.0–15.0)
MCH: 24.8 pg — ABNORMAL LOW (ref 26.0–34.0)
MCHC: 31.4 g/dL (ref 30.0–36.0)
MCV: 78.9 fL (ref 78.0–100.0)
Platelets: 253 10*3/uL (ref 150–400)
RBC: 4.03 MIL/uL (ref 3.87–5.11)
RDW: 24.3 % — AB (ref 11.5–15.5)
WBC: 10.4 10*3/uL (ref 4.0–10.5)

## 2016-05-15 LAB — GLUCOSE, CAPILLARY
GLUCOSE-CAPILLARY: 103 mg/dL — AB (ref 65–99)
Glucose-Capillary: 89 mg/dL (ref 65–99)
Glucose-Capillary: 101 mg/dL — ABNORMAL HIGH (ref 65–99)

## 2016-05-15 LAB — PHOSPHORUS: Phosphorus: 3.8 mg/dL (ref 2.5–4.6)

## 2016-05-15 LAB — MAGNESIUM: Magnesium: 2.2 mg/dL (ref 1.7–2.4)

## 2016-05-15 MED ORDER — OSMOLITE 1.2 CAL PO LIQD
1000.0000 mL | ORAL | Status: DC
  Filled 2016-05-15: qty 1000

## 2016-05-15 MED ORDER — LIP MEDEX EX OINT
TOPICAL_OINTMENT | CUTANEOUS | Status: AC
  Administered 2016-05-15: 1
  Filled 2016-05-15: qty 7

## 2016-05-15 MED ORDER — OSMOLITE 1.2 CAL PO LIQD: 1000.0000 mL | ORAL | 0 refills | Status: DC

## 2016-05-15 MED ORDER — VITAL HIGH PROTEIN PO LIQD: 1000.0000 mL | ORAL | 99 refills | Status: DC

## 2016-05-15 MED ORDER — ADULT MULTIVITAMIN LIQUID CH: 15.0000 mL | Freq: Every day | ORAL | 99 refills | Status: DC

## 2016-05-15 NOTE — Progress Notes (Signed)
Brief Nutrition Note:  Paged by RN to change TF orders. Per RN pt is unable to go home on Vital High Protein and needs TF changed to Osmolite 1.2.  Orders changed to Osmolite 1.2 as per RD recommendations.   [place], [place], [place] Pager [phone] After Hours Pager

## 2016-05-15 NOTE — Progress Notes (Signed)
Spoke with patient at bedside, discussed need for [facility] services to assist with tube feeding. Patient chose [facility] for [facility] services. Contacted [facility] for referral, DME ordered and feeding orders entered.

## 2016-05-15 NOTE — Discharge Summary (Signed)
  Patient ID: [name] [id number] 40 y.o. 04-15-1976  05/11/2016  Discharge date and time: 05/15/2016   Admitting Physician: Excell Seltzer T  Discharge Physician: Excell Seltzer T  Admission Diagnoses: postoperative nausea and vomiting status post sleeve gastrectomy  Discharge Diagnoses: same  Operations: Procedure(s): [place] TUBE  Admission Condition: fair  Discharged Condition: good  Indication for Admission: Patient is just over 3 months status post laparoscopic sleeve gastrectomy with persistent nausea and intolerance of oral intake despite multiple negative studies. As a bronzing measure after extensive discussion we have elected to proceed with placement of a feeding jejunostomy tube for nutritional support.  Hospital Course: On the morning of admission the patient underwent an uneventful laparoscopic jejunostomy tube placement. She was left nothing by mouth for 24 hours and then was gradually begun on tube feedings which she was able to tolerate without difficulty. The pathology discharge she is tolerating 30 mL per hour with a goal rate of 40 mL/h or equivalent amount over a short period of time so she can be off of her pump and this will be gradually advanced after discharge. At the time of discharge her abdomen is soft and nontender. Tube site is fine. CBC is unremarkable. Chemistries, magnesium and phosphorus are all normal. She is felt ready for discharge.  Consults: {Dietary   Disposition:       Activity: activity as tolerated Diet: post-bariatric diet as tolerated Wound Care: change dressing on J-tube site daily  Follow-up:  With Dr. Excell Seltzer in 2 weeks.  Signed: Edward Jolly MD, FACS  05/15/2016, 8:48 AM

## 2016-05-15 NOTE — Discharge Instructions (Signed)
Call [facility] surgery as needed for problems or questions,  [phone]  May shower with bandage on tube and change Band-Aids after showering  Change bandage around tube daily   Flush tube with 20 mL of saline 4 times a day and each time the tube feeding is stopped

## 2016-05-15 NOTE — Progress Notes (Signed)
Phone call from [place] care stating they needed orders in computer changed from Vital High Protein to Osmolite.    MD paged and agreeable with Osmolite recommendations set by registered dietitian.    MD was unable to redo med rec at that time, PA on call was able to do med rec.  Pt DC'd with an unchanged assessment. All scripts were given.  All orders pertaining to discharge were answered.  IV removed and feeding tube flushed and clamped.  Dressing changed around feeding tube as well.    Pt was escorted to her mother's car with NT via wheelchair.  Roselind Rily

## 2016-05-15 NOTE — Progress Notes (Signed)
Nutrition Follow-up  DOCUMENTATION CODES:   Severe malnutrition in context of chronic illness, Obesity unspecified  INTERVENTION:  - Recommend Vital High Protein @ 45 mL/hr with 50 mL free water every 4 hours which will provide 1080 kcal (60% minimum estimated need), 95 grams of protein, and 1203 mL free water.  - Once Vital High Protein at goal rate, Advanced RD to monitor for ability to transition to intermittent feeds: Vital High Protein @ 60 mL/hr over 18 hours which will provide the same kcal and protein.  - If Vital High Protein unable to be covered by insurance and pt unable to pay out of pocket, recommend Osmolite 1.2 @ 35 mL/hr to advance by 10 mL every 24 hours to reach goal rate of Osmolite 1.2 @ 65 mL/hr. At goal rate, this regimen will provide 1872 kcal, 87 grams of protein, and 1279 mL free water.  - Once Osmolite 1.2 @ goal rate, Advanced RD to monitor for ability to transition to intermittent feeds: Osmolite 1.2 @ 85 mL/hr over 18 hours which will provide 1836 kcal, 85 grams of protein, and 1255 mL free water.     NUTRITION DIAGNOSIS:   Malnutrition related to chronic illness, altered GI function as evidenced by energy intake < or equal to 75% for > or equal to 1 month, mild depletion of body fat, severe depletion of muscle mass. -ongoing  GOAL:   Patient will meet greater than or equal to 90% of their needs -unmet with TF not yet at goal rate.  MONITOR:   PO intake, Labs, Weight trends, TF tolerance, Skin, I & O's  ASSESSMENT:   40 year old female just over 3 months following laparoscopic sleeve gastrectomy. She has had persistent nausea and vomiting and intolerance of oral intake essentially since surgery. She has had multiple studies including CT scans, upper GI series and endoscopy that does not show any identifiable problem with her sleeve such as obstruction or twisting or ulceration or hernia. She however feels that her current situation is intolerable due to  weakness and inability to eat and as a temporizing maneuver to allow further time to adjust to the surgery I have recommended placement of a feeding jejunostomy tube laparoscopically.   Call received from [place] concerning TF for home use and pt to d/c today. Pt with J-tube and is currently receiving Vital High Protein @ 30 mL/hr over 24 hours which is providing 720 kcal (40% minimum estimated need), 63 grams of protein (74% minimum estimated need), and 602 mL free water. She states that nausea persists but is no worse with TF rate being increased from 20 mL/hr to 30 mL/hr. She has had a few ice chips this afternoon and states that she ate 100% of a jello cup, although it took her 3 hours to consume it.  Current goal rate for TF: Vital High Protein @ 40 mL/hr over 24 hours via J-tube which will provide 960 kcal (53% minimum estimated kcal need), 84 grams of protein (99% minimum estimated need), and 803 mL free water.   Pt concerned after call received from [place] stating that Vital High Protein will not be covered by insurance. Spoke with pt about Vital High Protein ("specialty formula") and informed her that RD previously following had planned to transition pt to a "standard formula" prior to d/c.   Pt is currently unable to meet any mentionable percentage of her needs PO and is dependent on TF for 100% of kcal and protein needs at  this time. Recommend TF order meet 100%.  Spoke with Dr. Excell Seltzer concerning current tube feeding formula and goal rate versus switching to Osmolite 1.2. Priority is to optimize protein intake and to also allow her to be disconnected from feedings for a period of time each day. He would like to maintain her on Vital High Protein at home but indicates that if this is outside of pt's financial ability then another formula may be utilized to meet her protein needs. Vital High Protein and Osmolite 1.2 are both fiber-free formulas. TF recommendations outlined  above for both formulas dependent on insurance, pt abilities.   Will alert RD at [facility] that pt to d/c so that she can continue to follow pt on an outpatient basis as well.  Medications reviewed. Labs reviewed; BUN <5 mg/dL, Ca: 8.7 mg/dL, Mg: 1.6 mg/dL.    *PLEASE SEE CHART FOR RD NOTES EARLIER IN ADMISSION.   Diet Order:  Diet bariatric clear liquid Room service appropriate? Yes; Fluid consistency: Thin  Skin:  Wound (see comment) (abdominal incision 8/14)  Last BM:  8/18  Height:   Ht Readings from Last 1 Encounters:  05/11/16 5' 7"  (1.702 m)    Weight:   Wt Readings from Last 1 Encounters:  05/15/16 238 lb 8 oz (108.2 kg)    Ideal Body Weight:  61.4 kg  BMI:  Body mass index is 37.35 kg/m.  Estimated Nutritional Needs:   Kcal:  1800-2000  Protein:  85-95g  Fluid:  2L/day  EDUCATION NEEDS:   Education needs addressed    Jarome Matin, MS, RD, LDN Inpatient Clinical Dietitian Pager # [phone] After hours/weekend pager # [phone]

## 2016-05-18 ENCOUNTER — Other Ambulatory Visit (HOSPITAL_COMMUNITY): Payer: Self-pay | Admitting: General Surgery

## 2016-05-18 DIAGNOSIS — K9423 Gastrostomy malfunction: Secondary | ICD-10-CM

## 2016-05-18 NOTE — Progress Notes (Signed)
  Progress Note   Date: 05/18/2016  Patient Name: [name]        MRN#: [id number]  Review of the patient's clinical findings supports the diagnosis of  Moderate protein calorie malnutrition   Edward Jolly, MD

## 2016-05-19 ENCOUNTER — Other Ambulatory Visit: Payer: Self-pay | Admitting: General Surgery

## 2016-05-19 ENCOUNTER — Ambulatory Visit (HOSPITAL_COMMUNITY)
Admission: RE | Admit: 2016-05-19 | Discharge: 2016-05-19 | Disposition: A | Discharge status: Home / Self Care | Payer: 59 | Source: Ambulatory Visit | Attending: General Surgery | Admitting: General Surgery

## 2016-05-19 ENCOUNTER — Encounter (HOSPITAL_COMMUNITY): Payer: Self-pay | Admitting: Interventional Radiology

## 2016-05-19 DIAGNOSIS — Z434 Encounter for attention to other artificial openings of digestive tract: Secondary | ICD-10-CM | POA: Insufficient documentation

## 2016-05-19 DIAGNOSIS — K9423 Gastrostomy malfunction: Secondary | ICD-10-CM

## 2016-05-19 HISTORY — PX: IR GENERIC HISTORICAL: IMG1180011

## 2016-05-19 MED ORDER — IOPAMIDOL (ISOVUE-300) INJECTION 61%
INTRAVENOUS | Status: AC
  Administered 2016-05-19: 10 mL via GASTROSTOMY
  Filled 2016-05-19: qty 50

## 2016-05-22 ENCOUNTER — Telehealth: Payer: Self-pay | Admitting: Internal Medicine

## 2016-05-22 NOTE — Telephone Encounter (Signed)
Pt appt has been rescheduled per pt request, she has had knee surgery and also has no one to pick up her kids from school.  Appt has been changed to 07/29/16.

## 2016-05-25 ENCOUNTER — Encounter (HOSPITAL_COMMUNITY): Payer: Self-pay

## 2016-05-25 ENCOUNTER — Emergency Department (HOSPITAL_COMMUNITY)
Admission: EM | Admit: 2016-05-25 | Discharge: 2016-05-26 | Disposition: A | Discharge status: Home / Self Care | Payer: 59 | Attending: Emergency Medicine | Admitting: Emergency Medicine

## 2016-05-25 ENCOUNTER — Emergency Department (HOSPITAL_COMMUNITY): Payer: 59

## 2016-05-25 ENCOUNTER — Ambulatory Visit: Payer: 59 | Admitting: Internal Medicine

## 2016-05-25 DIAGNOSIS — Z79899 Other long term (current) drug therapy: Secondary | ICD-10-CM | POA: Insufficient documentation

## 2016-05-25 DIAGNOSIS — Z4659 Encounter for fitting and adjustment of other gastrointestinal appliance and device: Secondary | ICD-10-CM

## 2016-05-25 DIAGNOSIS — J45909 Unspecified asthma, uncomplicated: Secondary | ICD-10-CM | POA: Diagnosis not present

## 2016-05-25 DIAGNOSIS — Z431 Encounter for attention to gastrostomy: Secondary | ICD-10-CM | POA: Diagnosis not present

## 2016-05-25 DIAGNOSIS — Z87891 Personal history of nicotine dependence: Secondary | ICD-10-CM | POA: Insufficient documentation

## 2016-05-25 MED ORDER — IOPAMIDOL (ISOVUE-300) INJECTION 61%
50.0000 mL | Freq: Once | INTRAVENOUS | Status: AC | PRN
  Administered 2016-05-25: 50 mL

## 2016-05-25 MED ORDER — ONDANSETRON 4 MG PO TBDP
4.0000 mg | ORAL_TABLET | Freq: Once | ORAL | Status: AC
  Administered 2016-05-25: 4 mg via ORAL
  Filled 2016-05-25: qty 1

## 2016-05-25 NOTE — ED Triage Notes (Signed)
Pt had the peg tube placed on the 13th, she had the gastric sleeve done and hasn't been able to eat or drink

## 2016-05-25 NOTE — ED Triage Notes (Signed)
Pt states that her peg tube came out this evening and she was able to get it back it, but since then she feels nauseated and wants to fins out if its in the right place

## 2016-05-26 NOTE — ED Provider Notes (Signed)
[facility] DEPT Provider Note   CSN: [id number] Arrival date & time: 05/25/16  2157     History   Chief Complaint Chief Complaint  Patient presents with  . peg tube dislodged    HPI [name] is a 41 y.o. female.  HPI Patient presents to the emergency department with her feeding tube dislodging the patient states she caught the suture on the dryer door.  She states that tube came out some and she replaced it.  Patient states she does not have any chest pain, shortness breath, nausea, vomiting, weakness, dizziness, headache, blurred vision, back pain, neck pain, fever, dysuria, incontinence, or syncope Past Medical History:  Diagnosis Date  . Anal fissure - posterior 10/16/2014  . Anxiety    doesn't take anything  . Asthma    has Albuterol inhaler as needed  . Bronchitis   . Clostridium difficile infection 04/20/2012  . Dehydration   . Depression    doesn't take any meds  . Family history of adverse reaction to anesthesia    pt mom gets sick  . GERD (gastroesophageal reflux disease)    takes Pantoprazole daily  . Headache   . Hiatal hernia   . History of blood transfusion    last transfusion was 04/04/2016=Benadryl was given d/t itching. States she always itches with transfusion.   Marland Kitchen History of bronchitis    > 2 yrs ago  . History of colon polyps    benign  . History of migraine    last one 05/01/16  . History of urinary tract infection   . IDA (iron deficiency anemia)   . Internal and external bleeding hemorrhoids 06/11/2014  . Joint pain   . Joint swelling   . Left sided chronic colitis - segmental 06/11/2014  . Motion sickness   . Nausea    takes Zofran as needed  . Obesity   . Oligouria   . Osteoarthritis    back  . Pneumonia    hx of-yrs ago  . Postoperative nausea and vomiting 01/21/2016  . TOBACCO USER 10/02/2009   Qualifier: Diagnosis of  By: Dimas Millin MD, Ellard Artis    . Transfusion history    last transfusion 6'16   . UC (ulcerative colitis) ([place])    supposed to be taking Lialda and Bentyl but has been off since gastric sleeve  . Ulcerative colitis ([place])   . Vertigo    doesn't take any meds    Patient Active Problem List   Diagnosis Date Noted  . GI bleed 04/04/2016  . Hematochezia 04/04/2016  . 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 Postoperative nausea and vomiting 01/21/2016  . Morbid obesity with BMI of 45.0-49.9, adult ([place]) 01/14/2016  . Anemia, iron deficiency 10/01/2015  . Skin lesion 05/18/2015  . Anal fissure - posterior 10/16/2014  . Severe obesity (BMI >= 40) ([place]) 10/16/2014  . Left sided chronic colitis - segmental 06/11/2014  . Internal and external bleeding hemorrhoids 06/11/2014  . Asthma 04/26/2012  . Excessive or frequent menstruation 10/16/2011  . DEPRESSION, MILD 10/02/2009  . INTERNAL HEMORRHOIDS 11/15/2007    Past Surgical History:  Procedure Laterality Date  . COLONOSCOPY  2007   for rectal bleeding; [facility]  . COLONOSCOPY N/A 06/11/2014   Procedure: COLONOSCOPY;  Surgeon: Gatha Mayer, MD;  Location: [facility];  Service: Endoscopy;  Laterality: N/A;  . DILATION AND CURETTAGE OF UTERUS    . ESOPHAGOGASTRODUODENOSCOPY (EGD) WITH PROPOFOL N/A 04/06/2016   Procedure: ESOPHAGOGASTRODUODENOSCOPY (EGD) WITH PROPOFOL;  Surgeon: Alphonsa Overall, MD;  Location: [facility];  Service: General;  Laterality: N/A;  . FOOT SURGERY Bilateral   . GASTROJEJUNOSTOMY N/A 05/11/2016   Procedure: LAPAROSCOPIC PLACEMENT  OF FEEDING  JEJUNOSTOMY TUBE;  Surgeon: Excell Seltzer, MD;  Location: [facility]S;  Service: General;  Laterality: N/A;  . HEMORRHOIDECTOMY WITH HEMORRHOID BANDING    . IR GENERIC HISTORICAL  05/19/2016   IR CM INJ ANY COLONIC TUBE W/FLUORO 05/19/2016 Aletta Edouard, MD [facility]  . KNEE ARTHROSCOPY Left 09/06/2014  . LAPAROSCOPIC GASTRIC SLEEVE RESECTION N/A 01/14/2016   Procedure: LAPAROSCOPIC GASTRIC SLEEVE RESECTION;  Surgeon: Excell Seltzer, MD;  Location: [facility];  Service:  General;  Laterality: N/A;  . LAPAROSCOPIC TUBAL LIGATION  10/16/2011   Procedure: LAPAROSCOPIC TUBAL LIGATION;  Surgeon: Alwyn Pea, MD;  Location: [facility];  Service: Gynecology;  Laterality: Bilateral;  . NOVASURE ABLATION  09/28/2010   mild persistent vaginal bleeding  . right knee arthroscopy     05/07/2016  . TUBAL LIGATION    . wisdom teeth extracted      OB History    No data available       Home Medications    Prior to Admission medications   Medication Sig Start Date End Date Taking? Authorizing Provider  albuterol (PROVENTIL HFA;VENTOLIN HFA) 108 (90 Base) MCG/ACT inhaler Inhale 2 puffs into the lungs every 6 (six) hours as needed for wheezing or shortness of breath. For shortness of breath 09/30/15   Wardell Honour, MD  calcium citrate (CALCITRATE - DOSED IN MG ELEMENTAL CALCIUM) 950 MG tablet Take 200 mg of elemental calcium by mouth daily.    Historical Provider, MD  cyanocobalamin (,VITAMIN B-12,) 1000 MCG/ML injection Inject 1,000 mcg into the muscle every 30 (thirty) days. Reported on 04/01/2016    Historical Provider, MD  diclofenac sodium (VOLTAREN) 1 % GEL Apply 1 application topically 4 (four) times daily as needed (For knee pain.). Reported on 04/01/2016    Historical Provider, MD  EPINEPHrine 0.3 mg/0.3 mL IJ SOAJ injection Inject 0.3 mg into the muscle once as needed (For anaphylaxis.). Reported on 04/01/2016    Historical Provider, MD  fluticasone (FLONASE) 50 MCG/ACT nasal spray Place 1 spray into both nostrils daily as needed for allergies. Reported on 04/01/2016    Historical Provider, MD  HYDROcodone-acetaminophen (NORCO/VICODIN) 5-325 MG tablet Take 1 tablet by mouth every 6 (six) hours as needed for moderate pain. 04/06/16   Thurnell Lose, MD  Multiple Vitamin (MULTIVITAMIN) LIQD Place 15 mLs into feeding tube daily. 05/15/16   Excell Seltzer, MD  Multiple Vitamins-Minerals (MULTIVITAMIN & MINERAL PO) Take 1 tablet by mouth 2 (two) times daily. Reported on  04/01/2016    Historical Provider, MD  Nutritional Supplements (FEEDING SUPPLEMENT, OSMOLITE 1.2 CAL,) LIQD Place 1,000 mLs into feeding tube continuous. 05/15/16   Earnstine Regal, PA-C  Nutritional Supplements (FEEDING SUPPLEMENT, VITAL HIGH PROTEIN,) LIQD liquid Place 1,000 mLs into feeding tube daily. 05/15/16   Excell Seltzer, MD  ondansetron (ZOFRAN-ODT) 8 MG disintegrating tablet Take 1 tablet (8 mg total) by mouth every 8 (eight) hours as needed for nausea. 03/25/16   Wardell Honour, MD  pantoprazole (PROTONIX) 40 MG tablet Take 40 mg by mouth 2 (two) times daily. Reported on 04/01/2016 01/09/16   Historical Provider, MD    Family History Family History  Problem Relation Age of Onset  . Hypertension Mother   . Diabetes Mother   . Sarcoidosis Mother     lungs and skin  .  Asthma Mother   . Hypertension Father   . Diabetes Father   . Asthma Son   . Asthma Sister   . Cancer Sister     possible pancreatic cancer  . Adrenal disorder Sister     Tumor   . Asthma Brother   . Asthma Daughter     died age 75.5  . Cancer Daughter 4    brain; died age 75.5  . Asthma Son     Social History Social History  Substance Use Topics  . Smoking status: Former Smoker    Packs/day: 0.50    Years: 20.00    Types: Cigarettes    Quit date: 03/17/2014  . Smokeless tobacco: Never Used  . Alcohol use No     Allergies   Oxycodone; Ciprofloxacin; Doxycycline; Morphine and related; Other; and Penicillins   Review of Systems Review of Systems All other systems negative except as documented in the HPI. All pertinent positives and negatives as reviewed in the HPI.  Physical Exam Updated Vital Signs BP 107/85 (BP Location: Left Arm)   Pulse 86   Temp 98.7 F (37.1 C) (Oral)   Resp 20   LMP 05/10/2016 (Exact Date)   SpO2 100%   Physical Exam  Constitutional: She is oriented to person, place, and time. She appears well-developed and well-nourished. No distress.  HENT:  Head: Normocephalic  and atraumatic.  Eyes: Pupils are equal, round, and reactive to light.  Cardiovascular: Normal rate, regular rhythm and normal heart sounds.  Exam reveals no gallop and no friction rub.   No murmur heard. Pulmonary/Chest: Effort normal and breath sounds normal. No respiratory distress. She has no wheezes.  Abdominal: Soft. Bowel sounds are normal. She exhibits no distension and no mass. There is no tenderness. There is no rebound and no guarding.  Neurological: She is alert and oriented to person, place, and time. She exhibits normal muscle tone. Coordination normal.  Skin: Skin is warm and dry. No rash noted. No erythema.  Psychiatric: She has a normal mood and affect. Her behavior is normal.  Nursing note and vitals reviewed.    ED Treatments / Results  Labs (all labs ordered are listed, but only abnormal results are displayed) Labs Reviewed - No data to display  EKG  EKG Interpretation None       Radiology Dg Abdomen 1 View  Result Date: 05/25/2016 CLINICAL DATA:  Peg tube placed 2 weeks ago. Suspicion the tube may have been pulled out. EXAM: ABDOMEN - 1 VIEW COMPARISON:  05/19/2016 FINDINGS: Left upper quadrant jejunostomy tube is present. Contrast material is injected into the tube, demonstrating contrast material in the jejunum. No contrast extravasation. Surgical clips in the left upper quadrant. Bowel gas pattern is normal. No small or large bowel distention. Visualized bones appear intact. IMPRESSION: Left upper quadrant jejunostomy tube appears in satisfactory location within the jejunum. Electronically Signed   By: Lucienne Capers M.D.   On: 05/25/2016 23:34    Procedures Procedures (including critical care time)  Medications Ordered in ED Medications  ondansetron (ZOFRAN-ODT) disintegrating tablet 4 mg (4 mg Oral Given 05/25/16 2228)  iopamidol (ISOVUE-300) 61 % injection 50 mL (50 mLs Other Contrast Given 05/25/16 2250)     Initial Impression / Assessment and Plan  / ED Course  I have reviewed the triage vital signs and the nursing notes.  Pertinent labs & imaging results that were available during my care of the patient were reviewed by me and considered in my medical  decision making (see chart for details).  Clinical Course    Patient will be referred back to her surgeon.  The tube is in good alignment.  Based on the x-ray.  She does not have any distention of her abdomen.  Patient is advised to return here as needed.  Tube is secured with tape at this time to help facilitate the proper alignment  Final Clinical Impressions(s) / ED Diagnoses   Final diagnoses:  None    New Prescriptions New Prescriptions   No medications on file     Dalia Heading, PA-C 05/26/16 0029    April Palumbo, MD 05/26/16 [phone]

## 2016-05-26 NOTE — ED Notes (Signed)
Pt ambulatory and independent at discharge.  Verbalized understanding of discharge instructions 

## 2016-05-26 NOTE — Discharge Instructions (Signed)
Follow-up with your surgeon tomorrow for a recheck and further evaluation of the feeding tube

## 2016-06-05 ENCOUNTER — Encounter: Payer: Self-pay | Admitting: Physician Assistant

## 2016-06-05 ENCOUNTER — Ambulatory Visit (INDEPENDENT_AMBULATORY_CARE_PROVIDER_SITE_OTHER): Payer: 59 | Admitting: Physician Assistant

## 2016-06-05 VITALS — BP 130/80 | HR 83 | Temp 99.1°F | Resp 17 | Ht 66.5 in | Wt 230.0 lb

## 2016-06-05 DIAGNOSIS — K9420 Gastrostomy complication, unspecified: Secondary | ICD-10-CM

## 2016-06-05 DIAGNOSIS — K9423 Gastrostomy malfunction: Secondary | ICD-10-CM

## 2016-06-05 DIAGNOSIS — R1900 Intra-abdominal and pelvic swelling, mass and lump, unspecified site: Secondary | ICD-10-CM

## 2016-06-05 DIAGNOSIS — R42 Dizziness and giddiness: Secondary | ICD-10-CM | POA: Diagnosis not present

## 2016-06-05 LAB — COMPLETE METABOLIC PANEL WITH GFR
ALBUMIN: 3.7 g/dL (ref 3.6–5.1)
ALK PHOS: 52 U/L (ref 33–115)
ALT: 4 U/L — ABNORMAL LOW (ref 6–29)
AST: 9 U/L — AB (ref 10–30)
BUN: 8 mg/dL (ref 7–25)
CALCIUM: 9.7 mg/dL (ref 8.6–10.2)
CO2: 22 mmol/L (ref 20–31)
Chloride: 107 mmol/L (ref 98–110)
Creat: 0.88 mg/dL (ref 0.50–1.10)
GFR, EST NON AFRICAN AMERICAN: 84 mL/min (ref >=60)
GFR, Est African American: >89 mL/min (ref >=60)
Glucose, Bld: 78 mg/dL (ref 65–99)
POTASSIUM: 4.9 mmol/L (ref 3.5–5.3)
Sodium: 139 mmol/L (ref 135–146)
Total Bilirubin: 0.7 mg/dL (ref 0.2–1.2)
Total Protein: 7.1 g/dL (ref 6.1–8.1)

## 2016-06-05 LAB — POCT CBC
GRANULOCYTE PERCENT: 56.7 % (ref 37–80)
HEMATOCRIT: 34.3 % — AB (ref 37.7–47.9)
Hemoglobin: 11.6 g/dL — AB (ref 12.2–16.2)
Lymph, poc: 3.6 — AB (ref 0.6–3.4)
MCH, POC: 26.9 pg — AB (ref 27–31.2)
MCHC: 33.8 g/dL (ref 31.8–35.4)
MCV: 79.6 fL — AB (ref 80–97)
MID (CBC): 0.8 (ref 0–0.9)
MPV: 9.3 fL (ref 0–99.8)
POC GRANULOCYTE: 5.8 (ref 2–6.9)
POC LYMPH %: 35.6 % (ref 10–50)
POC MID %: 7.7 % (ref 0–12)
Platelet Count, POC: 334 10*3/uL (ref 142–424)
RBC: 4.3 M/uL (ref 4.04–5.48)
RDW, POC: 25 %
WBC: 10.2 10*3/uL (ref 4.6–10.2)

## 2016-06-05 LAB — POCT URINALYSIS DIP (MANUAL ENTRY)
BILIRUBIN UA: NEGATIVE
GLUCOSE UA: NEGATIVE
Leukocytes, UA: NEGATIVE
NITRITE UA: NEGATIVE
RBC UA: NEGATIVE
Spec Grav, UA: 1.025
Urobilinogen, UA: 0.2
pH, UA: 5.5

## 2016-06-05 LAB — POC MICROSCOPIC URINALYSIS (UMFC): MUCUS RE: ABSENT

## 2016-06-05 LAB — POC HEMOCCULT BLD/STL (OFFICE/1-CARD/DIAGNOSTIC): FECAL OCCULT BLD: POSITIVE — AB

## 2016-06-05 NOTE — Patient Instructions (Addendum)
You have an appointment with dr. Excell Seltzer at 3:15pm.  Please be there prompt.  This is a "work-in" appointment, so you may have to wait.  I will have the notes in epic to refer.    IF you received an x-ray today, you will receive an invoice from [facility]. Please contact [facility] at [phone] with questions or concerns regarding your invoice.   IF you received labwork today, you will receive an invoice from [employer]. Please contact [facility] at [phone] with questions or concerns regarding your invoice.   Our billing staff will not be able to assist you with questions regarding bills from these companies.  You will be contacted with the lab results as soon as they are available. The fastest way to get your results is to activate your My Chart account. Instructions are located on the last page of this paperwork. If you have not heard from [place] regarding the results in 2 weeks, please contact this office.

## 2016-06-05 NOTE — Progress Notes (Addendum)
Patient ID: ALINE WESCHE, female   DOB: [birth year], 40 y.o.   MRN: [id number] [facility] [address], [place] [address] [phone]  Date:  06/05/2016   Name:  [name]   DOB:  10-27-1975   MRN:  [id number]  PCP:  Reginia Forts, MD   By signing my name below, I, Ladene Artist, attest that this documentation has been prepared under the direction and in the presence of Ivar Drape, PA-C Electronically Signed: Ladene Artist, ED Scribe 06/05/2016 at 9:30 AM.  History of Present Illness:  [name] is a 40 y.o. female patient, with a h/o ulcerative colitis diagnosed in 2015, who presents to [facility] complaining of an infected feeding tube. Pt's pshx includes laparoscopic sleeve gastrectomy in May 2017 but she states that she has been unable to eat since the procedure and had the tube inserted. She states that the plan is to have the feeding tube in place for 6 months-1 year. Pt states that she has noticed tenderness and warmth surrounding the feeding tube, as well as a slimy discharge from the tube. She states that discharge increases with standing and movement. She reports associated symptoms of dizziness, fatigue and chronic nausea and diarrhea. Pt denies fever and diaphoresis. She has tried Tylenol, cleaning and flushing the tube without significant relief. She has noticed "clumpy yellow drainage" while flushing the feeding tube with 30 mL of water. Pt is on a continuous feed but states that she occasionally consumes 1 bottle a water daily and tries to add some into her feeding tub as well. She also reports hematochezia. She states that she typically has daily BMs. Her next appointment with her GI doctor is in November. Pt reports decreased urine output and states that her urine is dark in color. Pt's home heath nurse advised her to come in today.     Patient's last menstrual period was 05/10/2016 (exact date).  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   Postoperative nausea and vomiting 01/21/2016   Morbid obesity with BMI of 45.0-49.9, adult ([place]) 01/14/2016   Anemia, iron deficiency 10/01/2015   Skin lesion 05/18/2015   Anal fissure - posterior 10/16/2014   Severe obesity (BMI >= 40) ([place]) 10/16/2014   Left sided chronic colitis - segmental 06/11/2014   Internal and external bleeding hemorrhoids 06/11/2014   Asthma 04/26/2012   Excessive or frequent menstruation 10/16/2011   DEPRESSION, MILD 10/02/2009   INTERNAL HEMORRHOIDS 11/15/2007    Past Medical History:  Diagnosis Date   Anal fissure - posterior 10/16/2014   Anxiety    doesn't take anything   Asthma    has Albuterol inhaler as needed   Bronchitis    Clostridium difficile infection 04/20/2012   Dehydration    Depression    doesn't take any meds   Family history of adverse reaction to anesthesia    pt mom gets sick   GERD (gastroesophageal reflux disease)    takes Pantoprazole daily   Headache    Hiatal hernia    History of blood transfusion    last transfusion was 04/04/2016=Benadryl was given d/t itching. States she always itches with transfusion.    History of bronchitis    >  2 yrs ago   History of colon polyps    benign   History of migraine    last one 05/01/16   History of urinary tract infection    IDA (iron deficiency anemia)    Internal and external bleeding hemorrhoids 06/11/2014   Joint pain    Joint swelling    Left sided chronic colitis - segmental 06/11/2014   Motion sickness    Nausea    takes Zofran as needed   Obesity    Oligouria    Osteoarthritis    back   Pneumonia    hx of-yrs ago   Postoperative nausea and vomiting 01/21/2016   TOBACCO USER  10/02/2009   Qualifier: Diagnosis of  By: Dimas Millin MD, Ellard Artis     Transfusion history    last transfusion 6'16    UC (ulcerative colitis) ([place])    supposed to be taking Lialda and Bentyl but has been off since gastric sleeve   Ulcerative colitis ([place])    Vertigo    doesn't take any meds    Past Surgical History:  Procedure Laterality Date   COLONOSCOPY  2007   for rectal bleeding; [facility]   COLONOSCOPY N/A 06/11/2014   Procedure: COLONOSCOPY;  Surgeon: Gatha Mayer, MD;  Location: [facility];  Service: Endoscopy;  Laterality: N/A;   DILATION AND CURETTAGE OF UTERUS     ESOPHAGOGASTRODUODENOSCOPY (EGD) WITH PROPOFOL N/A 04/06/2016   Procedure: ESOPHAGOGASTRODUODENOSCOPY (EGD) WITH PROPOFOL;  Surgeon: Alphonsa Overall, MD;  Location: [facility];  Service: General;  Laterality: N/A;   FOOT SURGERY Bilateral    GASTROJEJUNOSTOMY N/A 05/11/2016   Procedure: LAPAROSCOPIC PLACEMENT  OF FEEDING  JEJUNOSTOMY TUBE;  Surgeon: Excell Seltzer, MD;  Location: [facility];  Service: General;  Laterality: N/A;   HEMORRHOIDECTOMY WITH HEMORRHOID BANDING     IR [place] HISTORICAL  05/19/2016   IR CM INJ ANY COLONIC TUBE W/FLUORO 05/19/2016 Aletta Edouard, MD [facility]   KNEE ARTHROSCOPY Left 09/06/2014   LAPAROSCOPIC GASTRIC SLEEVE RESECTION N/A 01/14/2016   Procedure: LAPAROSCOPIC GASTRIC SLEEVE RESECTION;  Surgeon: Excell Seltzer, MD;  Location: [facility];  Service: General;  Laterality: N/A;   LAPAROSCOPIC TUBAL LIGATION  10/16/2011   Procedure: LAPAROSCOPIC TUBAL LIGATION;  Surgeon: Alwyn Pea, MD;  Location: [facility];  Service: Gynecology;  Laterality: Bilateral;   NOVASURE ABLATION  09/28/2010   mild persistent vaginal bleeding   right knee arthroscopy     05/07/2016   TUBAL LIGATION     wisdom teeth extracted      Social History  Substance Use Topics   Smoking status: Former Smoker    Packs/day: 0.50    Years: 20.00    Types: Cigarettes    Quit date: 03/17/2014    Smokeless tobacco: Never Used   Alcohol use No    Family History  Problem Relation Age of Onset   Hypertension Mother    Diabetes Mother    Sarcoidosis Mother     lungs and skin   Asthma Mother    Hypertension Father    Diabetes Father    Asthma Son    Asthma Sister    Cancer Sister     possible pancreatic cancer   Adrenal disorder Sister     Tumor    Asthma Brother    Asthma Daughter     died age 76.5   Cancer Daughter 64    brain; died age 76.5   Asthma Son     Allergies  Allergen Reactions  Oxycodone Anaphylaxis   Ciprofloxacin Itching and Rash   Doxycycline Nausea And Vomiting   Morphine And Related Swelling and Other (See Comments)    Pt reported throat swelling to morphine.  Has tolerated Norco and Dilaudid.   Other     Needs paper tape   Penicillins Rash and Other (See Comments)    .Marland KitchenHas patient had a PCN reaction causing immediate rash, facial/tongue/throat swelling, SOB or lightheadedness with hypotension: Yes Has patient had a PCN reaction causing severe rash involving mucus membranes or skin necrosis: No Has patient had a PCN reaction that required hospitalization UNKNOWN - childhood allergy  Has patient had a PCN reaction occurring within the last 10 years: No If all of the above answers are "NO", then may proceed with Cephalosporin use.     Medication list has been reviewed and updated.  Current Outpatient Prescriptions on File Prior to Visit  Medication Sig Dispense Refill   albuterol (PROVENTIL HFA;VENTOLIN HFA) 108 (90 Base) MCG/ACT inhaler Inhale 2 puffs into the lungs every 6 (six) hours as needed for wheezing or shortness of breath. For shortness of breath 18 g 2   cyanocobalamin (,VITAMIN B-12,) 1000 MCG/ML injection Inject 1,000 mcg into the muscle every 30 (thirty) days. Reported on 04/01/2016     diclofenac sodium (VOLTAREN) 1 % GEL Apply 1 application topically 4 (four) times daily as needed (For knee pain.). Reported on  04/01/2016     EPINEPHrine 0.3 mg/0.3 mL IJ SOAJ injection Inject 0.3 mg into the muscle once as needed (For anaphylaxis.). Reported on 04/01/2016     fluticasone (FLONASE) 50 MCG/ACT nasal spray Place 1 spray into both nostrils daily as needed for allergies. Reported on 04/01/2016     Multiple Vitamin (MULTIVITAMIN) LIQD Place 15 mLs into feeding tube daily. 250 mL prn   Nutritional Supplements (FEEDING SUPPLEMENT, VITAL HIGH PROTEIN,) LIQD liquid Place 1,000 mLs into feeding tube daily. 5000 mL prn   ondansetron (ZOFRAN-ODT) 8 MG disintegrating tablet Take 1 tablet (8 mg total) by mouth every 8 (eight) hours as needed for nausea. 20 tablet 3   pantoprazole (PROTONIX) 40 MG tablet Take 40 mg by mouth 2 (two) times daily. Reported on 04/01/2016     No current facility-administered medications on file prior to visit.     Review of Systems  Constitutional: Positive for malaise/fatigue. Negative for diaphoresis and fever.  Gastrointestinal: Positive for blood in stool, diarrhea (chronic) and nausea (chronic).  Neurological: Positive for dizziness.    Physical Examination: BP 130/80 (BP Location: Right Arm, Patient Position: Sitting, Cuff Size: Large)    Pulse 83    Temp 99.1 F (37.3 C) (Oral)    Resp 17    Ht 5' 6.5" (1.689 m)    Wt 230 lb (104.3 kg)    LMP 05/10/2016 (Exact Date)    SpO2 99%    BMI 36.57 kg/m  Ideal Body Weight: @FLOWAMB ([id number])@  Physical Exam  Constitutional: She is oriented to person, place, and time. She appears well-developed and well-nourished. No distress.  HENT:  Head: Normocephalic and atraumatic.  Right Ear: External ear normal.  Left Ear: External ear normal.  Eyes: Conjunctivae and EOM are normal. Pupils are equal, round, and reactive to light.  Cardiovascular: Normal rate.   Pulmonary/Chest: Effort normal. No respiratory distress.  Abdominal:  On the lateral side of the feeding tube there is some nodularity that is tender but no erythema    Genitourinary:  Genitourinary Comments: Chaperone present   Neurological: She is  alert and oriented to person, place, and time.  Skin: She is not diaphoretic.  Psychiatric: She has a normal mood and affect. Her behavior is normal.    Assessment and Plan: [name] is a 40 y.o. female who is here today for cc of abdominal pain, mass, and drainage next to feeding tube.  possiblity of abscess, fistula given hx.  She is able to see her Dr. Excell Seltzer today at 3:15pm.  She will return to him.  Hoping for [place] of abdomen to locate rule out abscess.  She will need higher treatment of abx.  Abdominal mass - Plan: POCT CBC, COMPLETE METABOLIC PANEL WITH GFR, POCT Microscopic Urinalysis ([facility]), POCT urinalysis dipstick, POC Hemoccult Bld/Stl (1-Cd Office Dx), Orthostatic vital signs  Dizziness - Plan: POCT CBC, COMPLETE METABOLIC PANEL WITH GFR, POCT Microscopic Urinalysis ([facility]), POCT urinalysis dipstick, POC Hemoccult Bld/Stl (1-Cd Office Dx), Orthostatic vital signs  Complication of feeding tube ([place]) - Plan: POCT CBC, COMPLETE METABOLIC PANEL WITH GFR, POCT Microscopic Urinalysis ([facility]), POCT urinalysis dipstick, POC Hemoccult Bld/Stl (1-Cd Office Dx), Orthostatic vital signs  Ivar Drape, PA-C [facility] 06/05/2016 9:30 AM

## 2016-06-14 ENCOUNTER — Encounter (HOSPITAL_COMMUNITY): Payer: Self-pay | Admitting: *Deleted

## 2016-06-14 ENCOUNTER — Emergency Department (HOSPITAL_COMMUNITY): Payer: 59

## 2016-06-14 ENCOUNTER — Emergency Department (HOSPITAL_COMMUNITY)
Admission: EM | Admit: 2016-06-14 | Discharge: 2016-06-14 | Disposition: A | Discharge status: Home / Self Care | Payer: 59 | Attending: Emergency Medicine | Admitting: Emergency Medicine

## 2016-06-14 DIAGNOSIS — Z87891 Personal history of nicotine dependence: Secondary | ICD-10-CM | POA: Insufficient documentation

## 2016-06-14 DIAGNOSIS — J45909 Unspecified asthma, uncomplicated: Secondary | ICD-10-CM | POA: Diagnosis not present

## 2016-06-14 DIAGNOSIS — Y939 Activity, unspecified: Secondary | ICD-10-CM | POA: Insufficient documentation

## 2016-06-14 DIAGNOSIS — S31119A Laceration without foreign body of abdominal wall, unspecified quadrant without penetration into peritoneal cavity, initial encounter: Secondary | ICD-10-CM | POA: Insufficient documentation

## 2016-06-14 DIAGNOSIS — S3991XA Unspecified injury of abdomen, initial encounter: Secondary | ICD-10-CM | POA: Diagnosis present

## 2016-06-14 DIAGNOSIS — Z79899 Other long term (current) drug therapy: Secondary | ICD-10-CM | POA: Insufficient documentation

## 2016-06-14 DIAGNOSIS — K9413 Enterostomy malfunction: Secondary | ICD-10-CM | POA: Diagnosis not present

## 2016-06-14 DIAGNOSIS — Y999 Unspecified external cause status: Secondary | ICD-10-CM | POA: Insufficient documentation

## 2016-06-14 DIAGNOSIS — Y929 Unspecified place or not applicable: Secondary | ICD-10-CM | POA: Diagnosis not present

## 2016-06-14 DIAGNOSIS — X509XXA Other and unspecified overexertion or strenuous movements or postures, initial encounter: Secondary | ICD-10-CM | POA: Insufficient documentation

## 2016-06-14 LAB — BASIC METABOLIC PANEL WITH GFR
Anion gap: 5 (ref 5–15)
BUN: 6 mg/dL (ref 6–20)
CO2: 23 mmol/L (ref 22–32)
Calcium: 9 mg/dL (ref 8.9–10.3)
Chloride: 111 mmol/L (ref 101–111)
Creatinine, Ser: 0.98 mg/dL (ref 0.44–1.00)
GFR calc Af Amer: >60 mL/min
GFR calc non Af Amer: >60 mL/min
Glucose, Bld: 79 mg/dL (ref 65–99)
Potassium: 4.2 mmol/L (ref 3.5–5.1)
Sodium: 139 mmol/L (ref 135–145)

## 2016-06-14 LAB — CBC WITH DIFFERENTIAL/PLATELET
Basophils Absolute: 0 K/uL (ref 0.0–0.1)
Basophils Relative: 0 %
Eosinophils Absolute: 0.5 K/uL (ref 0.0–0.7)
Eosinophils Relative: 7 %
HCT: 32.5 % — ABNORMAL LOW (ref 36.0–46.0)
Hemoglobin: 10.3 g/dL — ABNORMAL LOW (ref 12.0–15.0)
Lymphocytes Relative: 41 %
Lymphs Abs: 3.2 K/uL (ref 0.7–4.0)
MCH: 27 pg (ref 26.0–34.0)
MCHC: 31.7 g/dL (ref 30.0–36.0)
MCV: 85.1 fL (ref 78.0–100.0)
Monocytes Absolute: 0.5 K/uL (ref 0.1–1.0)
Monocytes Relative: 6 %
Neutro Abs: 3.6 K/uL (ref 1.7–7.7)
Neutrophils Relative %: 46 %
Platelets: 282 K/uL (ref 150–400)
RBC: 3.82 MIL/uL — ABNORMAL LOW (ref 3.87–5.11)
RDW: 22.3 % — ABNORMAL HIGH (ref 11.5–15.5)
WBC: 7.8 K/uL (ref 4.0–10.5)

## 2016-06-14 MED ORDER — SULFAMETHOXAZOLE-TRIMETHOPRIM 200-40 MG/5ML PO SUSP: 10.0000 mL | Freq: Two times a day (BID) | ORAL | 0 refills | Status: AC

## 2016-06-14 MED ORDER — LIDOCAINE-EPINEPHRINE 2 %-1:100000 IJ SOLN: 20.0000 mL | Freq: Once | INTRAMUSCULAR | Status: DC

## 2016-06-14 MED ORDER — IOPAMIDOL (ISOVUE-300) INJECTION 61%
30.0000 mL | Freq: Once | INTRAVENOUS | Status: AC | PRN
  Administered 2016-06-14: 30 mL via ORAL

## 2016-06-14 MED ORDER — LIDOCAINE-EPINEPHRINE (PF) 1 %-1:200000 IJ SOLN
INTRAMUSCULAR | Status: AC
  Administered 2016-06-14: 30 mL
  Filled 2016-06-14: qty 30

## 2016-06-14 NOTE — ED Notes (Signed)
BLOOD CULTURE # 1 DRAWN AND SENT FROM THE LEFT HAND 5CC PER BOTTLE.

## 2016-06-14 NOTE — Discharge Instructions (Signed)
Follow-up with your family physician or your gastroenterologist as scheduled.

## 2016-06-14 NOTE — ED Provider Notes (Signed)
[facility] DEPT Provider Note   CSN: [id number] Arrival date & time: 06/14/16  1241     History   Chief Complaint Chief Complaint  Patient presents with  . feeding tube came out    HPI [name] is a 40 y.o. female.  HPI Patient states that her feeding tube came out this morning.  She tried to push it back and hurt her gurgling noise and was afraid it was not in the right place.  This also been draining some material around it.  She has a history of ulcerative colitis.  Denies any vomiting. Past Medical History:  Diagnosis Date  . Anal fissure - posterior 10/16/2014  . Anxiety    doesn't take anything  . Asthma    has Albuterol inhaler as needed  . Bronchitis   . Clostridium difficile infection 04/20/2012  . Dehydration   . Depression    doesn't take any meds  . Family history of adverse reaction to anesthesia    pt mom gets sick  . GERD (gastroesophageal reflux disease)    takes Pantoprazole daily  . Headache   . Hiatal hernia   . History of blood transfusion    last transfusion was 04/04/2016=Benadryl was given d/t itching. States she always itches with transfusion.   Marland Kitchen History of bronchitis    > 2 yrs ago  . History of colon polyps    benign  . History of migraine    last one 05/01/16  . History of urinary tract infection   . IDA (iron deficiency anemia)   . Internal and external bleeding hemorrhoids 06/11/2014  . Joint pain   . Joint swelling   . Left sided chronic colitis - segmental 06/11/2014  . Motion sickness   . Nausea    takes Zofran as needed  . Obesity   . Oligouria   . Osteoarthritis    back  . Pneumonia    hx of-yrs ago  . Postoperative nausea and vomiting 01/21/2016  . TOBACCO USER 10/02/2009   Qualifier: Diagnosis of  By: Dimas Millin MD, Ellard Artis    . Transfusion history    last transfusion 6'16   . UC (ulcerative colitis) ([place])    supposed to be taking Lialda and Bentyl but has been off since gastric sleeve  . Ulcerative colitis ([place])   .  Vertigo    doesn't take any meds    Patient Active Problem List   Diagnosis Date Noted  . GI bleed 04/04/2016  . Hematochezia 04/04/2016  . 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 Postoperative nausea and vomiting 01/21/2016  . Morbid obesity with BMI of 45.0-49.9, adult ([place]) 01/14/2016  . Anemia, iron deficiency 10/01/2015  . Skin lesion 05/18/2015  . Anal fissure - posterior 10/16/2014  . Severe obesity (BMI >= 40) ([place]) 10/16/2014  . Left sided chronic colitis - segmental 06/11/2014  . Internal and external bleeding hemorrhoids 06/11/2014  . Asthma 04/26/2012  . Excessive or frequent menstruation 10/16/2011  . DEPRESSION, MILD 10/02/2009  . INTERNAL HEMORRHOIDS 11/15/2007    Past Surgical History:  Procedure Laterality Date  . COLONOSCOPY  2007   for rectal bleeding; [facility]  . COLONOSCOPY N/A 06/11/2014   Procedure: COLONOSCOPY;  Surgeon: Gatha Mayer, MD;  Location: [facility];  Service: Endoscopy;  Laterality: N/A;  . DILATION AND CURETTAGE OF UTERUS    . ESOPHAGOGASTRODUODENOSCOPY (EGD) WITH PROPOFOL N/A 04/06/2016   Procedure: ESOPHAGOGASTRODUODENOSCOPY (EGD) WITH PROPOFOL;  Surgeon: Alphonsa Overall, MD;  Location: [facility];  Service: General;  Laterality: N/A;  . FOOT SURGERY Bilateral   . GASTROJEJUNOSTOMY N/A 05/11/2016   Procedure: LAPAROSCOPIC PLACEMENT  OF FEEDING  JEJUNOSTOMY TUBE;  Surgeon: Excell Seltzer, MD;  Location: [facility];  Service: General;  Laterality: N/A;  . HEMORRHOIDECTOMY WITH HEMORRHOID BANDING    . IR [place] HISTORICAL  05/19/2016   IR CM INJ ANY COLONIC TUBE W/FLUORO 05/19/2016 Aletta Edouard, MD [facility]  . KNEE ARTHROSCOPY Left 09/06/2014  . LAPAROSCOPIC GASTRIC SLEEVE RESECTION N/A 01/14/2016   Procedure: LAPAROSCOPIC GASTRIC SLEEVE RESECTION;  Surgeon: Excell Seltzer, MD;  [facility];  Service: General;  Laterality: N/A;  . LAPAROSCOPIC TUBAL LIGATION  10/16/2011   Procedure: LAPAROSCOPIC TUBAL LIGATION;   Surgeon: Alwyn Pea, MD;  Location: [facility];  Service: Gynecology;  Laterality: Bilateral;  . NOVASURE ABLATION  09/28/2010   mild persistent vaginal bleeding  . right knee arthroscopy     05/07/2016  . TUBAL LIGATION    . wisdom teeth extracted      OB History    No data available       Home Medications    Prior to Admission medications   Medication Sig Start Date End Date Taking? Authorizing Provider  albuterol (PROVENTIL HFA;VENTOLIN HFA) 108 (90 Base) MCG/ACT inhaler Inhale 2 puffs into the lungs every 6 (six) hours as needed for wheezing or shortness of breath. For shortness of breath 09/30/15  Yes Wardell Honour, MD  cyanocobalamin (,VITAMIN B-12,) 1000 MCG/ML injection Inject 1,000 mcg into the muscle every 30 (thirty) days. Reported on 04/01/2016   Yes Historical Provider, MD  diclofenac sodium (VOLTAREN) 1 % GEL Apply 1 application topically 4 (four) times daily as needed (For knee pain.). Reported on 04/01/2016   Yes Historical Provider, MD  EPINEPHrine 0.3 mg/0.3 mL IJ SOAJ injection Inject 0.3 mg into the muscle once as needed (For anaphylaxis.). Reported on 04/01/2016   Yes Historical Provider, MD  fluticasone (FLONASE) 50 MCG/ACT nasal spray Place 1 spray into both nostrils daily as needed for allergies. Reported on 04/01/2016   Yes Historical Provider, MD  Multiple Vitamin (MULTIVITAMIN) LIQD Place 15 mLs into feeding tube daily. 05/15/16  Yes Excell Seltzer, MD  Nutritional Supplements (FEEDING SUPPLEMENT, OSMOLITE 1.2 CAL,) LIQD Place 1,000 mLs into feeding tube continuous.   Yes Historical Provider, MD  ondansetron (ZOFRAN-ODT) 8 MG disintegrating tablet Take 1 tablet (8 mg total) by mouth every 8 (eight) hours as needed for nausea. 03/25/16  Yes Wardell Honour, MD  pantoprazole (PROTONIX) 40 MG tablet Take 40 mg by mouth 2 (two) times daily. Reported on 04/01/2016 01/09/16  Yes Historical Provider, MD  Nutritional Supplements (FEEDING SUPPLEMENT, VITAL HIGH PROTEIN,) LIQD liquid  Place 1,000 mLs into feeding tube daily. Patient not taking: Reported on 06/14/2016 05/15/16   Excell Seltzer, MD  sulfamethoxazole-trimethoprim (BACTRIM,SEPTRA) 200-40 MG/5ML suspension Place 10 mLs into feeding tube 2 (two) times daily. 06/14/16 06/19/16  Leonard Schwartz, MD    Family History Family History  Problem Relation Age of Onset  . Hypertension Mother   . Diabetes Mother   . Sarcoidosis Mother     lungs and skin  . Asthma Mother   . Hypertension Father   . Diabetes Father   . Asthma Son   . Asthma Sister   . Cancer Sister     possible pancreatic cancer  . Adrenal disorder Sister     Tumor   . Asthma Brother   . Asthma Daughter  died age 28.5  . Cancer Daughter 4    brain; died age 28.5  . Asthma Son     Social History Social History  Substance Use Topics  . Smoking status: Former Smoker    Packs/day: 0.50    Years: 20.00    Types: Cigarettes    Quit date: 03/17/2014  . Smokeless tobacco: Never Used  . Alcohol use No     Allergies   Oxycodone; Ciprofloxacin; Doxycycline; Morphine and related; Other; and Penicillins   Review of Systems Review of Systems All other systems reviewed and are negative  Physical Exam Updated Vital Signs BP 110/66 (BP Location: Left Arm)   Pulse 62   Temp 98.4 F (36.9 C) (Oral)   Resp 17   Ht 5' 6.5" (1.689 m)   Wt 230 lb (104.3 kg)   LMP 05/10/2016 (Exact Date)   SpO2 100%   BMI 36.57 kg/m   Physical Exam Physical Exam  Nursing note and vitals reviewed. Constitutional: She is oriented to person, place, and time. She appears well-developed and well-nourished. No distress.  HENT:  Head: Normocephalic and atraumatic.  Eyes: Pupils are equal, round, and reactive to light.  Neck: Normal range of motion.  Cardiovascular: Normal rate and intact distal pulses.   Pulmonary/Chest: No respiratory distress.  Abdominal: Normal appearance. She exhibits no distension.  jejunostomy to me to in place with purulent drainage  from ostomy. Musculoskeletal: Normal range of motion.  Neurological: She is alert and oriented to person, place, and time. No cranial nerve deficit.  Skin: Skin is warm and dry. No rash noted.  Psychiatric: She has a normal mood and affect. Her behavior is normal.    ED Treatments / Results  Labs (all labs ordered are listed, but only abnormal results are displayed) Labs Reviewed  CBC WITH DIFFERENTIAL/PLATELET - Abnormal; Notable for the following:       Result Value   RBC 3.82 (*)    Hemoglobin 10.3 (*)    HCT 32.5 (*)    RDW 22.3 (*)    All other components within normal limits  AEROBIC CULTURE (SUPERFICIAL SPECIMEN)  BASIC METABOLIC PANEL    EKG  EKG Interpretation None       Radiology Dg Abd 1 View  Result Date: 06/14/2016 CLINICAL DATA:  Jejunostomy tube placement. EXAM: ABDOMEN - 1 VIEW COMPARISON:  05/25/2016 FINDINGS: Surgical clips are present over the left upper quadrant. There is evidence of patient's percutaneous gastrostomy tube with tip a over the left upper quadrant centered over a jejunal loop. Contrast is present within the tube and adjacent small bowel. No evidence of contrast extravasation. Bowel gas pattern is nonobstructive. Remainder of the exam is unchanged. IMPRESSION: Nonobstructive bowel gas pattern. Percutaneous jejunostomy tube with tip in adequate position over a jejunal loop and appears to be functioning normally without evidence of contrast extravasation. Electronically Signed   By: Marin Olp M.D.   On: 06/14/2016 14:52    Procedures .Marland KitchenLaceration Repair Date/Time: 06/14/2016 3:57 PM Performed by: Seward Speck Authorized by: Leonard Schwartz   Consent:    Consent obtained:  Verbal   Consent given by:  Patient Anesthesia (see MAR for exact dosages):    Anesthesia method:  Local infiltration   Local anesthetic:  Lidocaine 2% WITH epi (2 ml) Laceration details:    Location: Abdomen. Repair type:    Repair type:   Intermediate Pre-procedure details:    Preparation:  Patient was prepped and draped in usual sterile fashion Skin repair:  Repair method:  Sutures   Suture size:  5-0   Suture material:  Nylon   Number of sutures:  2 Post-procedure details:    Dressing:  Sterile dressing   Patient tolerance of procedure:  Tolerated well, no immediate complications Comments:     Two sutures placed to keep feeding tube in place using surgical knots to tie down tube.     (including critical care time)  Medications Ordered in ED Medications  lidocaine-EPINEPHrine (XYLOCAINE W/EPI) 2 %-1:100000 (with pres) injection 20 mL (not administered)  iopamidol (ISOVUE-300) 61 % injection 30 mL (30 mLs Oral Contrast Given 06/14/16 1436)  lidocaine-EPINEPHrine (XYLOCAINE-EPINEPHrine) 1 %-1:200000 (PF) injection (30 mLs  Given by Other 06/14/16 1535)     Initial Impression / Assessment and Plan / ED Course  I have reviewed the triage vital signs and the nursing notes.  Pertinent labs & imaging results that were available during my care of the patient were reviewed by me and considered in my medical decision making (see chart for details).  Clinical Course      Final Clinical Impressions(s) / ED Diagnoses   Final diagnoses:  Malfunction of jejunostomy (HCC)    New Prescriptions New Prescriptions   SULFAMETHOXAZOLE-TRIMETHOPRIM (BACTRIM,SEPTRA) 200-40 MG/5ML SUSPENSION    Place 10 mLs into feeding tube 2 (two) times daily.     Leonard Schwartz, MD 06/14/16 [phone]

## 2016-06-14 NOTE — ED Triage Notes (Signed)
Pt states her feeding tube came out this morning. Pt tried to push the tube back in, pt heard a gurgling noise and felt soreness in her abdomen when trying to flush the feeding tube.

## 2016-06-16 LAB — AEROBIC CULTURE W GRAM STAIN (SUPERFICIAL SPECIMEN): Special Requests: NORMAL

## 2016-06-16 LAB — AEROBIC CULTURE  (SUPERFICIAL SPECIMEN)

## 2016-06-17 ENCOUNTER — Telehealth (HOSPITAL_BASED_OUTPATIENT_CLINIC_OR_DEPARTMENT_OTHER): Payer: Self-pay

## 2016-06-17 NOTE — Telephone Encounter (Signed)
Post ED Visit - Positive Culture Follow-up: Successful Patient Follow-Up  Culture assessed and recommendations reviewed by: []  Elenor Quinones, Pharm.D. []  Heide Guile, Pharm.D., BCPS []  Parks Neptune, Pharm.D. []  Alycia Rossetti, Pharm.D., BCPS []  [place], Pharm.D., BCPS, AAHIVP []  Legrand Como, Pharm.D., BCPS, AAHIVP []  Milus Glazier, Pharm.D. []  Stephens November, Pharm.DUvaldo Bristle Pharm D Positive urine culture  []  Patient discharged without antimicrobial prescription and treatment is now indicated [x]  Organism is resistant to prescribed ED discharge antimicrobial []  Patient with positive blood cultures  Changes discussed with ED provider: WardYork Cerise  [facility] New antibiotic prescription Cephalexin 556m QIDx7days [facility] [phone]  Contacted patient, date 06/17/16, time 0931   CGenia Del9/20/2017, 9:30 AM

## 2016-06-17 NOTE — Progress Notes (Signed)
ED Antimicrobial Stewardship Positive Culture Follow Up   [name] is an 40 y.o. female who presented to [facility] on 06/14/2016 with a chief complaint of  Chief Complaint  Patient presents with  . feeding tube came out    Recent Results (from the past 720 hour(s))  Wound or Superficial Culture     Status: None   Collection Time: 06/14/16  2:08 PM  Result Value Ref Range Status   Specimen Description ABDOMEN  Final   Special Requests Normal  Final   Gram Stain   Final    ABUNDANT WBC PRESENT, PREDOMINANTLY PMN MODERATE GRAM NEGATIVE RODS RARE GRAM POSITIVE COCCI IN PAIRS Performed at [facility]  Final   Report Status 06/16/2016 FINAL  Final   Organism ID, Bacteria KLEBSIELLA PNEUMONIAE  Final      Susceptibility   Klebsiella pneumoniae - MIC*    AMPICILLIN >=32 RESISTANT Resistant     CEFAZOLIN <=4 SENSITIVE Sensitive     CEFEPIME <=1 SENSITIVE Sensitive     CEFTAZIDIME <=1 SENSITIVE Sensitive     CEFTRIAXONE <=1 SENSITIVE Sensitive     CIPROFLOXACIN <=0.25 SENSITIVE Sensitive     GENTAMICIN <=1 SENSITIVE Sensitive     IMIPENEM <=0.25 SENSITIVE Sensitive     TRIMETH/SULFA >=320 RESISTANT Resistant     AMPICILLIN/SULBACTAM 4 SENSITIVE Sensitive     PIP/TAZO <=4 SENSITIVE Sensitive     Extended ESBL NEGATIVE Sensitive     * FEW KLEBSIELLA PNEUMONIAE    [x]  Treated with Septra, organism resistant to prescribed antimicrobial  New antibiotic prescription: Cephalexin 250 mg/5 mL- 10 mL (500 mg) 4 times a day for 7 days  Stop Septra   ED Provider: Pearlie Oyster, PA-C   Ihor Austin, Pharm D 06/17/2016, 8:50 AM Pharmacy Resident  Phone# [phone]

## 2016-07-21 ENCOUNTER — Encounter: Payer: Self-pay | Admitting: Family Medicine

## 2016-07-21 ENCOUNTER — Ambulatory Visit (INDEPENDENT_AMBULATORY_CARE_PROVIDER_SITE_OTHER): Payer: 59 | Admitting: Family Medicine

## 2016-07-21 VITALS — BP 124/80 | HR 81 | Temp 99.1°F | Resp 17 | Ht 66.5 in | Wt 216.0 lb

## 2016-07-21 DIAGNOSIS — E538 Deficiency of other specified B group vitamins: Secondary | ICD-10-CM | POA: Diagnosis not present

## 2016-07-21 DIAGNOSIS — Z23 Encounter for immunization: Secondary | ICD-10-CM

## 2016-07-21 MED ORDER — CYANOCOBALAMIN 1000 MCG/ML IJ SOLN
1000.0000 ug | INTRAMUSCULAR | Status: DC
  Administered 2016-07-21 – 2016-08-26 (×2): 1000 ug via INTRAMUSCULAR

## 2016-07-21 NOTE — Progress Notes (Signed)
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  Which resulted in a placement of jejunostomy due to unable to tolerate oral food intake.This order was originally placed by general surgeon and she is suppose to receive injections monthly.  She recalls that her last injection was administered about 1 month ago during her hospital administration.    Vitals:   07/21/16 0831  BP: 124/80  Pulse: 81  Resp: 17  Temp: 99.1 F (37.3 C)   Plan: Influenza vaccine and B12 replacement administered by CMA.  Carroll Sage. Kenton Kingfisher, MSN, FNP-C [facility]

## 2016-07-21 NOTE — Patient Instructions (Signed)
     IF you received an x-ray today, you will receive an invoice from [facility]. Please contact [facility] at [phone] with questions or concerns regarding your invoice.   IF you received labwork today, you will receive an invoice from [facility]. Please contact [facility] at [phone] with questions or concerns regarding your invoice.   Our billing staff will not be able to assist you with questions regarding bills from these companies.  You will be contacted with the lab results as soon as they are available. The fastest way to get your results is to activate your My Chart account. Instructions are located on the last page of this paperwork. If you have not heard from us regarding the results in 2 weeks, please contact this office.      

## 2016-07-22 ENCOUNTER — Ambulatory Visit (INDEPENDENT_AMBULATORY_CARE_PROVIDER_SITE_OTHER): Payer: 59 | Admitting: Neurology

## 2016-07-22 ENCOUNTER — Other Ambulatory Visit (INDEPENDENT_AMBULATORY_CARE_PROVIDER_SITE_OTHER): Payer: Self-pay

## 2016-07-22 ENCOUNTER — Encounter: Payer: Self-pay | Admitting: Neurology

## 2016-07-22 VITALS — BP 122/84 | HR 60 | Ht 67.0 in | Wt 216.0 lb

## 2016-07-22 DIAGNOSIS — M542 Cervicalgia: Secondary | ICD-10-CM

## 2016-07-22 DIAGNOSIS — R202 Paresthesia of skin: Secondary | ICD-10-CM | POA: Insufficient documentation

## 2016-07-22 DIAGNOSIS — Z0289 Encounter for other administrative examinations: Secondary | ICD-10-CM

## 2016-07-22 DIAGNOSIS — R519 Headache, unspecified: Secondary | ICD-10-CM | POA: Insufficient documentation

## 2016-07-22 DIAGNOSIS — R413 Other amnesia: Secondary | ICD-10-CM

## 2016-07-22 DIAGNOSIS — G8929 Other chronic pain: Secondary | ICD-10-CM

## 2016-07-22 DIAGNOSIS — R51 Headache: Secondary | ICD-10-CM

## 2016-07-22 HISTORY — DX: Other chronic pain: G89.29

## 2016-07-22 HISTORY — DX: Headache, unspecified: R51.9

## 2016-07-22 NOTE — Progress Notes (Signed)
Reason for visit: Numbness  Referring physician: Dr. Windy Kalata [name] is a 40 y.o. female  History of present illness:  [name] is a 40 year old right-handed black female with a history of morbid obesity. The patient underwent bariatric surgery in April 2017. She has also had bilateral knee surgery, she had arthroscopic surgery on the left knee in 2015, and the right knee in 2017. The patient has had a lot of nausea, significant weight loss following bariatric surgery, she required a feeding tube placement in August 2017. The patient has lost from around 320 pounds to about 216 pounds since the surgery. The patient is on vitamin B12 injections, multivitamins, and thiamine supplementation. Around May or April 2017 she began noting some numbness sensations on the medial aspects of the lower legs below the knees down to the ankle, not including the feet. The patient reports some sensation of weakness of the legs, gait instability, occasional falls. She denies changes in controlling the bowels or the bladder. She also reports some neck pain, heaviness sensations of the arms that has developed recently in the last 2 or 3 months. She has some discomfort into the right shoulder and upper arm on the right. She has occasional low back pain, but the neck pain is more significant. She reports a chronic history of daily headaches that are in the left occipital area projecting forward into the temporal area and behind the left eye that have been present "all her life". The headaches are associated with photophobia and phonophobia, and some nausea. The patient does report some dizziness, she has had occasional episodes of syncope in the past. She has reported a change in her memory within the last several months with difficulty with concentration. She is sent to this office through Dr. Alvan Dame for evaluation of the sensory changes that she has noted.  Past Medical History:  Diagnosis Date  . Anal fissure -  posterior 10/16/2014  . Anxiety    doesn't take anything  . Asthma    has Albuterol inhaler as needed  . Bronchitis   . Clostridium difficile infection 04/20/2012  . Colitis   . Dehydration   . Depression    doesn't take any meds  . Family history of adverse reaction to anesthesia    pt mom gets sick  . GERD (gastroesophageal reflux disease)    takes Pantoprazole daily  . Headache   . Hiatal hernia   . History of blood transfusion    last transfusion was 04/04/2016=Benadryl was given d/t itching. States she always itches with transfusion.   Marland Kitchen History of bronchitis    > 2 yrs ago  . History of colon polyps    benign  . History of migraine    last one 05/01/16  . History of urinary tract infection   . IDA (iron deficiency anemia)   . Internal and external bleeding hemorrhoids 06/11/2014  . Joint pain   . Joint swelling   . Left sided chronic colitis - segmental 06/11/2014  . Migraine   . Motion sickness   . Nausea    takes Zofran as needed  . Obesity   . Oligouria   . Osteoarthritis    back  . Pneumonia    hx of-yrs ago  . Postoperative nausea and vomiting 01/21/2016  . TOBACCO USER 10/02/2009   Qualifier: Diagnosis of  By: Dimas Millin MD, Ellard Artis    . Transfusion history    last transfusion 6'16   . UC (ulcerative  colitis) ([place])    supposed to be taking Lialda and Bentyl but has been off since gastric sleeve  . Ulcerative colitis ([place])   . Vertigo    doesn't take any meds    Past Surgical History:  Procedure Laterality Date  . COLONOSCOPY  2007   for rectal bleeding; [facility]  . COLONOSCOPY N/A 06/11/2014   Procedure: COLONOSCOPY;  Surgeon: Gatha Mayer, MD;  Location: [facility];  Service: Endoscopy;  Laterality: N/A;  . DILATION AND CURETTAGE OF UTERUS    . ESOPHAGOGASTRODUODENOSCOPY (EGD) WITH PROPOFOL N/A 04/06/2016   Procedure: ESOPHAGOGASTRODUODENOSCOPY (EGD) WITH PROPOFOL;  Surgeon: Alphonsa Overall, MD;  Location: [facility];  Service: General;  Laterality: N/A;  .  FOOT SURGERY Bilateral   . GASTROJEJUNOSTOMY N/A 05/11/2016   Procedure: LAPAROSCOPIC PLACEMENT  OF FEEDING  JEJUNOSTOMY TUBE;  Surgeon: Excell Seltzer, MD;  Location: [facility];  Service: General;  Laterality: N/A;  . HEMORRHOIDECTOMY WITH HEMORRHOID BANDING    . IR GENERIC HISTORICAL  05/19/2016   IR CM INJ ANY COLONIC TUBE W/FLUORO 05/19/2016 Aletta Edouard, MD [facility]  . KNEE ARTHROSCOPY Left 09/06/2014  . LAPAROSCOPIC GASTRIC SLEEVE RESECTION N/A 01/14/2016   Procedure: LAPAROSCOPIC GASTRIC SLEEVE RESECTION;  Surgeon: Excell Seltzer, MD;  Location: [facility];  Service: General;  Laterality: N/A;  . LAPAROSCOPIC TUBAL LIGATION  10/16/2011   Procedure: LAPAROSCOPIC TUBAL LIGATION;  Surgeon: Alwyn Pea, MD;  Location: [facility];  Service: Gynecology;  Laterality: Bilateral;  . NOVASURE ABLATION  09/28/2010   mild persistent vaginal bleeding  . right knee arthroscopy     05/07/2016  . TUBAL LIGATION    . wisdom teeth extracted      Family History  Problem Relation Age of Onset  . Hypertension Mother   . Diabetes Mother   . Sarcoidosis Mother     lungs and skin  . Asthma Mother   . Hypertension Father   . Diabetes Father   . Asthma Son   . Tics Son   . Asthma Sister   . Cancer Sister     possible pancreatic cancer  . Adrenal disorder Sister     Tumor   . Asthma Brother   . Asthma Daughter     died age 39.5  . Cancer Daughter 4    brain; died age 39.5  . Asthma Son     Social history:  reports that she quit smoking about 2 years ago. Her smoking use included Cigarettes. She has a 10.00 pack-year smoking history. She has never used smokeless tobacco. She reports that she does not drink alcohol or use drugs.  Medications:  Prior to Admission medications   Medication Sig Start Date End Date Taking? Authorizing Provider  albuterol (PROVENTIL HFA;VENTOLIN HFA) 108 (90 Base) MCG/ACT inhaler Inhale 2 puffs into the lungs every 6 (six) hours as needed for wheezing or shortness of  breath. For shortness of breath 09/30/15  Yes Wardell Honour, MD  [facility] B-1 100 MG tablet FINELY CRUSH TAB AND ADMINSTER THROUGH FEEDING AFTER MIXING WITH LIQUID. THEN FLUSH TUBE WITH WATER 06/11/16  Yes Historical Provider, MD  cyanocobalamin (,VITAMIN B-12,) 1000 MCG/ML injection Inject 1,000 mcg into the muscle every 30 (thirty) days. Reported on 04/01/2016   Yes Historical Provider, MD  diclofenac sodium (VOLTAREN) 1 % GEL Apply 1 application topically 4 (four) times daily as needed (For knee pain.). Reported on 04/01/2016   Yes Historical Provider, MD  EPINEPHrine 0.3 mg/0.3 mL IJ SOAJ injection Inject  0.3 mg into the muscle once as needed (For anaphylaxis.). Reported on 04/01/2016   Yes Historical Provider, MD  fluticasone (FLONASE) 50 MCG/ACT nasal spray Place 1 spray into both nostrils daily as needed for allergies. Reported on 04/01/2016   Yes Historical Provider, MD  Multiple Vitamin (MULTIVITAMIN) LIQD Place 15 mLs into feeding tube daily. 05/15/16  Yes Excell Seltzer, MD  Nutritional Supplements (FEEDING SUPPLEMENT, OSMOLITE 1.2 CAL,) LIQD Place 1,000 mLs into feeding tube continuous.   Yes Historical Provider, MD  ondansetron (ZOFRAN-ODT) 8 MG disintegrating tablet Take 1 tablet (8 mg total) by mouth every 8 (eight) hours as needed for nausea. 03/25/16  Yes Wardell Honour, MD  pantoprazole (PROTONIX) 40 MG tablet Take 40 mg by mouth 2 (two) times daily. Reported on 04/01/2016 01/09/16  Yes Historical Provider, MD      Allergies  Allergen Reactions  . Oxycodone Anaphylaxis  . Ciprofloxacin Itching and Rash  . Doxycycline Nausea And Vomiting  . Morphine And Related Swelling and Other (See Comments)    Pt reported throat swelling to morphine.  Has tolerated Norco and Dilaudid.  . Other     Needs paper tape  . Penicillins Rash and Other (See Comments)    .Marland KitchenHas patient had a PCN reaction causing immediate rash, facial/tongue/throat swelling, SOB or lightheadedness with hypotension: Yes Has patient  had a PCN reaction causing severe rash involving mucus membranes or skin necrosis: No Has patient had a PCN reaction that required hospitalization UNKNOWN - childhood allergy  Has patient had a PCN reaction occurring within the last 10 years: No If all of the above answers are "NO", then may proceed with Cephalosporin use.     ROS:  Out of a complete 14 system review of symptoms, the patient complains only of the following symptoms, and all other reviewed systems are negative.  Fatigue Right sided chest pain Birthmarks, itching, moles Eye discomfort Snoring Anemia, easy bruising Feeling cold Joint pain, joint swelling, muscle cramps Allergies, runny nose Memory loss, headache, numbness, weakness, dizziness, passing out Depression, anxiety, not enough sleep, decreased energy, disinterest in activities Restless legs  Blood pressure 122/84, pulse 60, height 5' 7"  (1.702 m), weight 216 lb (98 kg).  Physical Exam  General: The patient is alert and cooperative at the time of the examination. The patient is markedly obese.  Eyes: Pupils are equal, round, and reactive to light. Discs are flat bilaterally.  Neck: The neck is supple, no carotid bruits are noted.  Respiratory: The respiratory examination is clear.  Cardiovascular: The cardiovascular examination reveals a regular rate and rhythm, no obvious murmurs or rubs are noted.  Neuromuscular: Range of movement the cervical and lumbar spines is normal.  Skin: Extremities are without significant edema.  Neurologic Exam  Mental status: The patient is alert and oriented x 3 at the time of the examination. The patient has apparent normal recent and remote memory, with an apparently normal attention span and concentration ability.  Cranial nerves: Facial symmetry is present. There is good sensation of the face to pinprick and soft touch bilaterally. The strength of the facial muscles and the muscles to head turning and shoulder  shrug are normal bilaterally. Speech is well enunciated, no aphasia or dysarthria is noted. Extraocular movements are full. Visual fields are full. The tongue is midline, and the patient has symmetric elevation of the soft palate. No obvious hearing deficits are noted.  Motor: The motor testing reveals 5 over 5 strength of all 4 extremities. Good symmetric  motor tone is noted throughout.   Sensory: Sensory testing is intact to pinprick, soft touch, vibration sensation, and position sense on all 4 extremities, with exception of some slight decrease in pinprick sensation of the left forearm is compared to the right, decreased pinprick sensation on the medial aspects of both legs. No evidence of extinction is noted.  Coordination: Cerebellar testing reveals good finger-nose-finger and heel-to-shin bilaterally.  Gait and station: Gait is normal. Tandem gait is slightly unsteady. Romberg is negative. No drift is seen. The patient is able to walk on heels and the toes bilaterally.  Reflexes: Deep tendon reflexes are symmetric and normal bilaterally. Ankle jerk reflexes are well-maintained bilaterally. Toes are downgoing bilaterally.   CT head and cervical 03/24/16:  IMPRESSION: CT head: There is mild invagination of CSF into the sella. Ventricles are normal in size and configuration. No intracranial mass, hemorrhage, or extra-axial fluid collection. Gray-white compartments appear normal.  CT cervical spine: Small focus of calcification in the anterior ligament at C5-6. No appreciable disc space narrowing. No disc extrusion or stenosis. No nerve root edema or effacement. No fracture or spondylolisthesis.  * CT scan images were reviewed online. I agree with the written report.    Assessment/Plan:  1. History of morbid obesity  2. Paresthesias bilateral lower extremities  3. Gait disturbance  4. Chronic daily headache  5. Reported memory disturbance  6. Neck pain, right arm and  shoulder discomfort  The patient has multiple somatic complaints. Objective clinical examination today is unremarkable. The patient will undergo a workup to include blood work today, she will have nerve conduction studies done on both legs and one arm, nerve conductions to include saphenous sensory latencies. EMG will be done on one leg. She will have MRI of the brain and cervical spine given the history of headache, memory changes, neck pain, right arm discomfort, and sensory alteration on all 4 extremities. The patient will return for EMG evaluation.  Jill Alexanders MD 07/22/2016 8:58 AM  [facility] [address] [place], [place] [address]  Phone [phone] Fax [phone]

## 2016-07-22 NOTE — Patient Instructions (Signed)
   We will get blood work today, get EMG and NCV to look at the nerve function in the legs, and get MRI of the brain and neck to look for cause of headache, neck pain and memory problems.

## 2016-07-24 ENCOUNTER — Telehealth: Payer: Self-pay | Admitting: Neurology

## 2016-07-24 LAB — ANGIOTENSIN CONVERTING ENZYME: Angio Convert Enzyme: 33 U/L (ref 14–82)

## 2016-07-24 LAB — ANA W/REFLEX: Anti Nuclear Antibody(ANA): NEGATIVE

## 2016-07-24 LAB — RPR: RPR: NONREACTIVE

## 2016-07-24 LAB — B. BURGDORFI ANTIBODIES: Lyme IgG/IgM Ab: <0.91 {ISR} (ref 0.00–0.90)

## 2016-07-24 LAB — HIV ANTIBODY (ROUTINE TESTING W REFLEX): HIV Screen 4th Generation wRfx: NONREACTIVE

## 2016-07-24 LAB — RHEUMATOID FACTOR: Rhuematoid fact SerPl-aCnc: <10 IU/mL (ref 0.0–13.9)

## 2016-07-24 LAB — COPPER, SERUM: COPPER: 137 ug/dL (ref 72–166)

## 2016-07-24 NOTE — Telephone Encounter (Signed)
I have called for a peer to peer review.   The MRI of the cervical spine has been approved, approval number is [id number].  MRI of the brain has been approved, approval number is  [id number]. The approval numbers are good through 09/07/2016.

## 2016-07-24 NOTE — Telephone Encounter (Signed)
Patient's MRI Brain is requiring a p2p. Please call [phone] Within the next two business day the case number is [id number].   Cervical was also denied, case number for this one is [id number].

## 2016-07-25 ENCOUNTER — Ambulatory Visit (INDEPENDENT_AMBULATORY_CARE_PROVIDER_SITE_OTHER): Payer: 59 | Admitting: Family Medicine

## 2016-07-25 ENCOUNTER — Telehealth: Payer: Self-pay | Admitting: Family Medicine

## 2016-07-25 VITALS — BP 130/80 | HR 94 | Temp 98.8°F | Resp 17 | Ht 67.0 in | Wt 216.0 lb

## 2016-07-25 DIAGNOSIS — N898 Other specified noninflammatory disorders of vagina: Secondary | ICD-10-CM | POA: Diagnosis not present

## 2016-07-25 LAB — POCT WET + KOH PREP
Trich by wet prep: ABSENT
Yeast by KOH: ABSENT
Yeast by wet prep: ABSENT

## 2016-07-25 MED ORDER — FLUCONAZOLE 150 MG PO TABS: 150.0000 mg | ORAL_TABLET | Freq: Once | ORAL | 0 refills | Status: AC

## 2016-07-25 MED ORDER — CLOTRIMAZOLE-BETAMETHASONE 1-0.05 % EX CREA: 1.0000 "application " | TOPICAL_CREAM | Freq: Two times a day (BID) | CUTANEOUS | 0 refills | Status: DC

## 2016-07-25 NOTE — Patient Instructions (Signed)
     IF you received an x-ray today, you will receive an invoice from [facility]. Please contact [facility] at [phone] with questions or concerns regarding your invoice.   IF you received labwork today, you will receive an invoice from [facility]. Please contact [facility] at [phone] with questions or concerns regarding your invoice.   Our billing staff will not be able to assist you with questions regarding bills from these companies.  You will be contacted with the lab results as soon as they are available. The fastest way to get your results is to activate your My Chart account. Instructions are located on the last page of this paperwork. If you have not heard from us regarding the results in 2 weeks, please contact this office.      

## 2016-07-25 NOTE — Progress Notes (Signed)
Chief Complaint  Patient presents with  . Vaginal Discharge    after being on heavy antibotics per patient     HPI  Pt reports t hat she was on antibiotics for a month. She started developing a vaginal discharge that is irritating She states that she tried a yeast suppository that caused burning irritation She noticed that her vagina is itching and burning She states that her discharge is thick like paste She reports that she does not want to be checked in the vaginal swab.  Past Medical History:  Diagnosis Date  . Anal fissure - posterior 10/16/2014  . Anxiety    doesn't take anything  . Asthma    has Albuterol inhaler as needed  . Bronchitis   . Chronic headache disorder 07/22/2016  . Clostridium difficile infection 04/20/2012  . Colitis   . Dehydration   . Depression    doesn't take any meds  . Family history of adverse reaction to anesthesia    pt mom gets sick  . GERD (gastroesophageal reflux disease)    takes Pantoprazole daily  . Headache   . Hiatal hernia   . History of blood transfusion    last transfusion was 04/04/2016=Benadryl was given d/t itching. States she always itches with transfusion.   Marland Kitchen History of bronchitis    > 2 yrs ago  . History of colon polyps    benign  . History of migraine    last one 05/01/16  . History of urinary tract infection   . IDA (iron deficiency anemia)   . Internal and external bleeding hemorrhoids 06/11/2014  . Joint pain   . Joint swelling   . Left sided chronic colitis - segmental 06/11/2014  . Migraine   . Motion sickness   . Nausea    takes Zofran as needed  . Obesity   . Oligouria   . Osteoarthritis    back  . Pneumonia    hx of-yrs ago  . Postoperative nausea and vomiting 01/21/2016  . TOBACCO USER 10/02/2009   Qualifier: Diagnosis of  By: Dimas Millin MD, Ellard Artis    . Transfusion history    last transfusion 6'16   . UC (ulcerative colitis) ([place])    supposed to be taking Lialda and Bentyl but has been off since gastric  sleeve  . Ulcerative colitis ([place])   . Vertigo    doesn't take any meds    Current Outpatient Prescriptions  Medication Sig Dispense Refill  . albuterol (PROVENTIL HFA;VENTOLIN HFA) 108 (90 Base) MCG/ACT inhaler Inhale 2 puffs into the lungs every 6 (six) hours as needed for wheezing or shortness of breath. For shortness of breath 18 g 2  . [facility] B-1 100 MG tablet FINELY CRUSH TAB AND ADMINSTER THROUGH FEEDING AFTER MIXING WITH LIQUID. THEN FLUSH TUBE WITH WATER  0  . cyanocobalamin (,VITAMIN B-12,) 1000 MCG/ML injection Inject 1,000 mcg into the muscle every 30 (thirty) days. Reported on 04/01/2016    . diclofenac sodium (VOLTAREN) 1 % GEL Apply 1 application topically 4 (four) times daily as needed (For knee pain.). Reported on 04/01/2016    . EPINEPHrine 0.3 mg/0.3 mL IJ SOAJ injection Inject 0.3 mg into the muscle once as needed (For anaphylaxis.). Reported on 04/01/2016    . fluticasone (FLONASE) 50 MCG/ACT nasal spray Place 1 spray into both nostrils daily as needed for allergies. Reported on 04/01/2016    . Multiple Vitamin (MULTIVITAMIN) LIQD Place 15 mLs into feeding tube daily. 250 mL prn  .  Nutritional Supplements (FEEDING SUPPLEMENT, OSMOLITE 1.2 CAL,) LIQD Place 1,000 mLs into feeding tube continuous.    . ondansetron (ZOFRAN-ODT) 8 MG disintegrating tablet Take 1 tablet (8 mg total) by mouth every 8 (eight) hours as needed for nausea. 20 tablet 3  . pantoprazole (PROTONIX) 40 MG tablet Take 40 mg by mouth 2 (two) times daily. Reported on 04/01/2016    . clotrimazole-betamethasone (LOTRISONE) cream Apply 1 application topically 2 (two) times daily. 30 g 0  . fluconazole (DIFLUCAN) 150 MG tablet Take 1 tablet (150 mg total) by mouth once. Repeat dose in 3 days. 2 tablet 0   Current Facility-Administered Medications  Medication Dose Route Frequency Provider Last Rate Last Dose  . cyanocobalamin ((VITAMIN B-12)) injection 1,000 mcg  1,000 mcg Intramuscular Q30 days Sedalia Muta, FNP    1,000 mcg at 07/21/16 7510    Allergies:  Allergies  Allergen Reactions  . Oxycodone Anaphylaxis  . Ciprofloxacin Itching and Rash  . Doxycycline Nausea And Vomiting  . Morphine And Related Swelling and Other (See Comments)    Pt reported throat swelling to morphine.  Has tolerated Norco and Dilaudid.  . Other     Needs paper tape  . Penicillins Rash and Other (See Comments)    .Marland KitchenHas patient had a PCN reaction causing immediate rash, facial/tongue/throat swelling, SOB or lightheadedness with hypotension: Yes Has patient had a PCN reaction causing severe rash involving mucus membranes or skin necrosis: No Has patient had a PCN reaction that required hospitalization UNKNOWN - childhood allergy  Has patient had a PCN reaction occurring within the last 10 years: No If all of the above answers are "NO", then may proceed with Cephalosporin use.     Past Surgical History:  Procedure Laterality Date  . COLONOSCOPY  2007   for rectal bleeding; [facility]  . COLONOSCOPY N/A 06/11/2014   Procedure: COLONOSCOPY;  Surgeon: Gatha Mayer, MD;  Location: [facility];  Service: Endoscopy;  Laterality: N/A;  . DILATION AND CURETTAGE OF UTERUS    . ESOPHAGOGASTRODUODENOSCOPY (EGD) WITH PROPOFOL N/A 04/06/2016   Procedure: ESOPHAGOGASTRODUODENOSCOPY (EGD) WITH PROPOFOL;  Surgeon: Alphonsa Overall, MD;  Location: [facility];  Service: General;  Laterality: N/A;  . FOOT SURGERY Bilateral   . GASTROJEJUNOSTOMY N/A 05/11/2016   Procedure: LAPAROSCOPIC PLACEMENT  OF FEEDING  JEJUNOSTOMY TUBE;  Surgeon: Excell Seltzer, MD;  Location: [facility];  Service: General;  Laterality: N/A;  . HEMORRHOIDECTOMY WITH HEMORRHOID BANDING    . IR GENERIC HISTORICAL  05/19/2016   IR CM INJ ANY COLONIC TUBE W/FLUORO 05/19/2016 Aletta Edouard, MD [facility]  . KNEE ARTHROSCOPY Left 09/06/2014  . LAPAROSCOPIC GASTRIC SLEEVE RESECTION N/A 01/14/2016   Procedure: LAPAROSCOPIC GASTRIC SLEEVE RESECTION;  Surgeon: Excell Seltzer, MD;  Location: [facility];  Service: General;  Laterality: N/A;  . LAPAROSCOPIC TUBAL LIGATION  10/16/2011   Procedure: LAPAROSCOPIC TUBAL LIGATION;  Surgeon: Alwyn Pea, MD;  Location: [facility];  Service: Gynecology;  Laterality: Bilateral;  . NOVASURE ABLATION  09/28/2010   mild persistent vaginal bleeding  . right knee arthroscopy     05/07/2016  . TUBAL LIGATION    . wisdom teeth extracted      Social History   Social History  . Marital status: Single    Spouse name: N/A  . Number of children: 3  . Years of education: Some college   Occupational History  . [employer] and [employer]     Currently out on disability d/t surg  Social History Main Topics  . Smoking status: Former Smoker    Packs/day: 0.50    Years: 20.00    Types: Cigarettes    Quit date: 03/17/2014  . Smokeless tobacco: Never Used  . Alcohol use No  . Drug use: No  . Sexual activity: Not Currently   Other Topics Concern  . None   Social History Narrative   Marital status:  Single; not dating seriously.      Children:  3 sons (18, 58, 12) whose father is deceased, 1 daughter (deceased); no grandchildren      Lives:  With 2 sons      Employment:  Full time; Corporate treasurer at [employer]      Tobacco: quit June 2015      Alcohol:  None      Drugs:  None      Exercise:  Leg lifts for knee strengthening; walking   1 caffeine beverage daily      Sexual activity:  Active; total partners = up there.  No STDs in past.        Right-handed       ROS See hpi  Objective: Vitals:   07/25/16 0930  BP: 130/80  Pulse: 94  Resp: 17  Temp: 98.8 F (37.1 C)  TempSrc: Oral  SpO2: 100%  Weight: 216 lb (98 kg)  Height: 5' 7"  (1.702 m)    Physical Exam  Constitutional: She appears well-developed and well-nourished.  Eyes: Conjunctivae and EOM are normal.  Pulmonary/Chest: Effort normal.  Psychiatric: She has a normal mood and affect. Her behavior is normal. Thought content normal.  Declined GU  exam today Elected to do a self-collect wet prep  Assessment and Plan Kati was seen today for vaginal discharge.  Diagnoses and all orders for this visit:  Vaginal discharge- will treat with diflucan for incompletely treated candidal vaginitis based on history. Report from the lab was that there was presence of lotions that obscured the test but lots of epithelial cells were visible Instructed to apply clotrimazole externally  -     GC/Chlamydia Probe Amp -     POCT Wet + KOH Prep  Other orders -     fluconazole (DIFLUCAN) 150 MG tablet; Take 1 tablet (150 mg total) by mouth once. Repeat dose in 3 days. -     clotrimazole-betamethasone (LOTRISONE) cream; Apply 1 application topically 2 (two) times daily.     [place]

## 2016-07-25 NOTE — Telephone Encounter (Signed)
Reviewed wet prep result with patient Discussed medications

## 2016-07-27 ENCOUNTER — Telehealth: Payer: Self-pay

## 2016-07-27 NOTE — Telephone Encounter (Signed)
[employer] paperwork completed and faxed to [employer] # [phone]. Pt may return to work w/ no limitations on 09/27/16.

## 2016-07-28 LAB — GC/CHLAMYDIA PROBE AMP
CT PROBE, AMP APTIMA: NOT DETECTED
GC Probe RNA: NOT DETECTED

## 2016-07-28 NOTE — Telephone Encounter (Signed)
Pt called and given [phone]

## 2016-07-28 NOTE — Telephone Encounter (Signed)
Sandi, would you mind calling to let patient know she can call [facility] to schedule whenever she is ready?

## 2016-07-29 ENCOUNTER — Ambulatory Visit (INDEPENDENT_AMBULATORY_CARE_PROVIDER_SITE_OTHER): Payer: 59 | Admitting: Internal Medicine

## 2016-07-29 ENCOUNTER — Encounter: Payer: Self-pay | Admitting: Internal Medicine

## 2016-07-29 DIAGNOSIS — K602 Anal fissure, unspecified: Secondary | ICD-10-CM | POA: Diagnosis not present

## 2016-07-29 DIAGNOSIS — K644 Residual hemorrhoidal skin tags: Secondary | ICD-10-CM | POA: Diagnosis not present

## 2016-07-29 DIAGNOSIS — K648 Other hemorrhoids: Secondary | ICD-10-CM | POA: Diagnosis not present

## 2016-07-29 DIAGNOSIS — K51511 Left sided colitis with rectal bleeding: Secondary | ICD-10-CM

## 2016-07-29 MED ORDER — DILTIAZEM GEL 2 %: 1.0000 "application " | Freq: Three times a day (TID) | CUTANEOUS | 5 refills | Status: DC

## 2016-07-29 NOTE — Assessment & Plan Note (Signed)
Persistent Try diltiazem gel RTC 3 months  Keep stools soft

## 2016-07-29 NOTE — Patient Instructions (Signed)
  We are sending an rx to [facility] for the Diltiazem gel.     Today we have given you a handout to read and follow on Anal fissure.    Keep your stools soft.    Follow up with Dr Carlean Purl in 3 months.    I appreciate the opportunity to care for you. Silvano Rusk, MD, [facility]

## 2016-07-29 NOTE — Progress Notes (Signed)
LEGEND PECORE 40 y.o. 1976-09-15 [id number]  Assessment & Plan:   Encounter Diagnoses  Name Primary?  . Left sided colitis with rectal bleeding ([place])   . Anal fissure - posterior   . Internal and external bleeding hemorrhoids     Diltiazem  Gel tid and for 1 month after sxs resolve Keep stools soft  RTC 3 mos - will need to revisit Tx and f/u colonoscopy - had planned to recheck the colon after 6-12 months on Lialda but that has never happened due to non-compliance. Should she need acute Tx of a flare may need to do liquid steroids via J tube Cc;SMITH,KRISTI, MD   Subjective:   Chief Complaint: rectal pain and bleeding  HPI 40 yo AA woman w/ a segmental Left-sided colitis found at colonoscopy 2015. She was placed on Lialda. Also had hemorrhoids and has had a posterior anal fissure. Having rectal pain and bleeding again - less so when stools soft. Had gastric sleeve procedure 3 mos ago but has had post-prandial vomiting problems. Now has a feeding jejunostomy tube in. Not on Lialda - has not been for some time. Cannot reliably ingest medications and keep them down.  Also out of work so difficulty with care and medication costs. Allergies  Allergen Reactions  . Oxycodone Anaphylaxis  . Ciprofloxacin Itching and Rash  . Doxycycline Nausea And Vomiting  . Morphine And Related Swelling and Other (See Comments)    Pt reported throat swelling to morphine.  Has tolerated Norco and Dilaudid.  . Other     Needs paper tape  . Penicillins Rash and Other (See Comments)    .Marland KitchenHas patient had a PCN reaction causing immediate rash, facial/tongue/throat swelling, SOB or lightheadedness with hypotension: Yes Has patient had a PCN reaction causing severe rash involving mucus membranes or skin necrosis: No Has patient had a PCN reaction that required hospitalization UNKNOWN - childhood allergy  Has patient had a PCN reaction occurring within the last 10 years: No If all of the above  answers are "NO", then may proceed with Cephalosporin use.    Outpatient Medications Prior to Visit  Medication Sig Dispense Refill  . albuterol (PROVENTIL HFA;VENTOLIN HFA) 108 (90 Base) MCG/ACT inhaler Inhale 2 puffs into the lungs every 6 (six) hours as needed for wheezing or shortness of breath. For shortness of breath 18 g 2  . clotrimazole-betamethasone (LOTRISONE) cream Apply 1 application topically 2 (two) times daily. 30 g 0  . [facility]S B-1 100 MG tablet FINELY CRUSH TAB AND ADMINSTER THROUGH FEEDING AFTER MIXING WITH LIQUID. THEN FLUSH TUBE WITH WATER  0  . cyanocobalamin (,VITAMIN B-12,) 1000 MCG/ML injection Inject 1,000 mcg into the muscle every 30 (thirty) days. Reported on 04/01/2016    . diclofenac sodium (VOLTAREN) 1 % GEL Apply 1 application topically 4 (four) times daily as needed (For knee pain.). Reported on 04/01/2016    . EPINEPHrine 0.3 mg/0.3 mL IJ SOAJ injection Inject 0.3 mg into the muscle once as needed (For anaphylaxis.). Reported on 04/01/2016    . fluticasone (FLONASE) 50 MCG/ACT nasal spray Place 1 spray into both nostrils daily as needed for allergies. Reported on 04/01/2016    . Multiple Vitamin (MULTIVITAMIN) LIQD Place 15 mLs into feeding tube daily. 250 mL prn  . Nutritional Supplements (FEEDING SUPPLEMENT, OSMOLITE 1.2 CAL,) LIQD Place 1,000 mLs into feeding tube continuous.    . ondansetron (ZOFRAN-ODT) 8 MG disintegrating tablet Take 1 tablet (8 mg total) by mouth every 8 (eight) hours  as needed for nausea. 20 tablet 3  . pantoprazole (PROTONIX) 40 MG tablet Take 40 mg by mouth 2 (two) times daily. Reported on 04/01/2016     Facility-Administered Medications Prior to Visit  Medication Dose Route Frequency Provider Last Rate Last Dose  . cyanocobalamin ((VITAMIN B-12)) injection 1,000 mcg  1,000 mcg Intramuscular Q30 days Sedalia Muta, FNP   1,000 mcg at 07/21/16 8563   Past Medical History:  Diagnosis Date  . Anal fissure - posterior 10/16/2014  . Anxiety     doesn't take anything  . Asthma    has Albuterol inhaler as needed  . Bronchitis   . Chronic headache disorder 07/22/2016  . Clostridium difficile infection 04/20/2012  . Colitis   . Dehydration   . Depression    doesn't take any meds  . Family history of adverse reaction to anesthesia    pt mom gets sick  . GERD (gastroesophageal reflux disease)    takes Pantoprazole daily  . Headache   . Hiatal hernia   . History of blood transfusion    last transfusion was 04/04/2016=Benadryl was given d/t itching. States she always itches with transfusion.   Marland Kitchen History of bronchitis    > 2 yrs ago  . History of colon polyps    benign  . History of migraine    last one 05/01/16  . History of urinary tract infection   . IDA (iron deficiency anemia)   . Internal and external bleeding hemorrhoids 06/11/2014  . Joint pain   . Joint swelling   . Left sided chronic colitis - segmental 06/11/2014  . Migraine   . Motion sickness   . Nausea    takes Zofran as needed  . Obesity   . Oligouria   . Osteoarthritis    back  . Pneumonia    hx of-yrs ago  . Postoperative nausea and vomiting 01/21/2016  . TOBACCO USER 10/02/2009   Qualifier: Diagnosis of  By: Dimas Millin MD, Ellard Artis    . Transfusion history    last transfusion 6'16   . UC (ulcerative colitis) ([place])    supposed to be taking Lialda and Bentyl but has been off since gastric sleeve  . Ulcerative colitis ([place])   . Vertigo    doesn't take any meds   Past Surgical History:  Procedure Laterality Date  . COLONOSCOPY  2007   for rectal bleeding; [facility]  . COLONOSCOPY N/A 06/11/2014   Procedure: COLONOSCOPY;  Surgeon: Gatha Mayer, MD;  [facility];  Service: Endoscopy;  Laterality: N/A;  . DILATION AND CURETTAGE OF UTERUS    . ESOPHAGOGASTRODUODENOSCOPY (EGD) WITH PROPOFOL N/A 04/06/2016   Procedure: ESOPHAGOGASTRODUODENOSCOPY (EGD) WITH PROPOFOL;  Surgeon: Alphonsa Overall, MD;  [facility];  Service: General;  Laterality:  N/A;  . FOOT SURGERY Bilateral    x 2  . GASTROJEJUNOSTOMY N/A 05/11/2016   Procedure: LAPAROSCOPIC PLACEMENT  OF FEEDING  JEJUNOSTOMY TUBE;  Surgeon: Excell Seltzer, MD;  [facility];  Service: General;  Laterality: N/A;  . HEMORRHOIDECTOMY WITH HEMORRHOID BANDING    . IR GENERIC HISTORICAL  05/19/2016   IR CM INJ ANY COLONIC TUBE W/FLUORO 05/19/2016 Aletta Edouard, MD [facility]  . KNEE ARTHROSCOPY Left 09/06/2014  . LAPAROSCOPIC GASTRIC SLEEVE RESECTION N/A 01/14/2016   Procedure: LAPAROSCOPIC GASTRIC SLEEVE RESECTION;  Surgeon: Excell Seltzer, MD;  Location: [facility];  Service: General;  Laterality: N/A;  . LAPAROSCOPIC TUBAL LIGATION  10/16/2011   Procedure: LAPAROSCOPIC TUBAL LIGATION;  Surgeon: Alwyn Pea, MD;  Location: [place] [facility];  Service: Gynecology;  Laterality: Bilateral;  . NOVASURE ABLATION  09/28/2010   mild persistent vaginal bleeding  . right knee arthroscopy     05/07/2016  . wisdom teeth extracted     Social History   Social History  . Marital status: Single    Spouse name: N/A  . Number of children: 3  . Years of education: Some college   Occupational History  . [employer]     Currently out on disability d/t surg   Social History Main Topics  . Smoking status: Former Smoker    Packs/day: 0.50    Years: 20.00    Types: Cigarettes    Quit date: 03/17/2014  . Smokeless tobacco: Never Used  . Alcohol use No  . Drug use: No  . Sexual activity: Not Currently   Other Topics Concern  . None   Social History Narrative   Marital status:  Single; not dating seriously.      Children:  3 sons (18, 53, 30) whose father is deceased, 1 daughter (deceased); no grandchildren      Lives:  With 2 sons      Employment:  Full time; Corporate treasurer at [employer]      Tobacco: quit June 2015      Alcohol:  None      Drugs:  None      Exercise:  Leg lifts for knee strengthening; walking   1 caffeine beverage daily      Sexual activity:  Active; total  partners = up there.  No STDs in past.        Right-handed      Family History  Problem Relation Age of Onset  . Hypertension Mother   . Diabetes Mother   . Sarcoidosis Mother     lungs and skin  . Asthma Mother   . Hypertension Father   . Diabetes Father   . Asthma Son   . Tics Son   . Asthma Sister   . Cancer Sister     possible pancreatic cancer  . Adrenal disorder Sister     Tumor   . Asthma Brother   . Asthma Daughter     died age 85.5  . Cancer Daughter 4    brain; died age 85.5  . Asthma Son   . Cancer Paternal Aunt     brain, colon, lung and esophagus; unsure of primary     Review of Systems As above  Objective:   Physical Exam BP 130/86   Pulse 64   Ht 5' 6"  (1.676 m)   Wt 216 lb 9.6 oz (98.2 kg)   BMI 34.96 kg/m  [name], CMA present.  Rectal  Small tag, posterior sentinel pile and sig anal stenosis and tenderness posterior - psterior anal fissure

## 2016-07-29 NOTE — Assessment & Plan Note (Signed)
Part of problem still

## 2016-07-29 NOTE — Assessment & Plan Note (Signed)
Not on Tx

## 2016-08-07 ENCOUNTER — Ambulatory Visit
Admission: RE | Admit: 2016-08-07 | Discharge: 2016-08-07 | Disposition: A | Discharge status: Home / Self Care | Payer: 59 | Source: Ambulatory Visit | Attending: Neurology | Admitting: Neurology

## 2016-08-07 DIAGNOSIS — G8929 Other chronic pain: Secondary | ICD-10-CM

## 2016-08-07 DIAGNOSIS — R202 Paresthesia of skin: Secondary | ICD-10-CM

## 2016-08-07 DIAGNOSIS — M542 Cervicalgia: Secondary | ICD-10-CM

## 2016-08-07 DIAGNOSIS — R413 Other amnesia: Secondary | ICD-10-CM

## 2016-08-07 DIAGNOSIS — R51 Headache: Secondary | ICD-10-CM

## 2016-08-07 DIAGNOSIS — R519 Headache, unspecified: Secondary | ICD-10-CM

## 2016-08-07 MED ORDER — GADOBENATE DIMEGLUMINE 529 MG/ML IV SOLN
20.0000 mL | Freq: Once | INTRAVENOUS | Status: AC | PRN
  Administered 2016-08-07: 20 mL via INTRAVENOUS

## 2016-08-10 ENCOUNTER — Telehealth: Payer: Self-pay | Admitting: Neurology

## 2016-08-10 NOTE — Telephone Encounter (Signed)
I Called the patient. The MRI of the head and neck are normal. EMG and NCV studies are pending.   MRI brain August 29, 2016:  IMPRESSION:  This is a normal MRI of the brain with and without contrast.   MRI cervical 08/29/16:  IMPRESSION:  This is a normal MRI of the cervical spine with and without contrast.

## 2016-08-12 ENCOUNTER — Emergency Department (HOSPITAL_COMMUNITY)
Admission: EM | Admit: 2016-08-12 | Discharge: 2016-08-13 | Disposition: A | Discharge status: Home / Self Care | Payer: 59 | Attending: Emergency Medicine | Admitting: Emergency Medicine

## 2016-08-12 ENCOUNTER — Emergency Department (HOSPITAL_COMMUNITY): Payer: 59

## 2016-08-12 ENCOUNTER — Encounter (HOSPITAL_COMMUNITY): Payer: Self-pay

## 2016-08-12 DIAGNOSIS — Z789 Other specified health status: Secondary | ICD-10-CM

## 2016-08-12 DIAGNOSIS — K9412 Enterostomy infection: Secondary | ICD-10-CM

## 2016-08-12 DIAGNOSIS — K9419 Other complications of enterostomy: Secondary | ICD-10-CM | POA: Insufficient documentation

## 2016-08-12 DIAGNOSIS — Z79899 Other long term (current) drug therapy: Secondary | ICD-10-CM | POA: Insufficient documentation

## 2016-08-12 DIAGNOSIS — J45909 Unspecified asthma, uncomplicated: Secondary | ICD-10-CM | POA: Diagnosis not present

## 2016-08-12 DIAGNOSIS — T85528A Displacement of other gastrointestinal prosthetic devices, implants and grafts, initial encounter: Secondary | ICD-10-CM

## 2016-08-12 DIAGNOSIS — Z87891 Personal history of nicotine dependence: Secondary | ICD-10-CM | POA: Insufficient documentation

## 2016-08-12 LAB — I-STAT CHEM 8, ED
BUN: 8 mg/dL (ref 6–20)
CALCIUM ION: 1.16 mmol/L (ref 1.15–1.40)
CHLORIDE: 103 mmol/L (ref 101–111)
Creatinine, Ser: 0.7 mg/dL (ref 0.44–1.00)
Glucose, Bld: 71 mg/dL (ref 65–99)
HCT: 39 % (ref 36.0–46.0)
Hemoglobin: 13.3 g/dL (ref 12.0–15.0)
POTASSIUM: 3.8 mmol/L (ref 3.5–5.1)
SODIUM: 140 mmol/L (ref 135–145)
TCO2: 24 mmol/L (ref 0–100)

## 2016-08-12 LAB — CBC WITH DIFFERENTIAL/PLATELET
BASOS ABS: 0 10*3/uL (ref 0.0–0.1)
Basophils Relative: 0 %
EOS ABS: 0.3 10*3/uL (ref 0.0–0.7)
Eosinophils Relative: 2 %
HCT: 33.5 % — ABNORMAL LOW (ref 36.0–46.0)
HEMOGLOBIN: 11 g/dL — AB (ref 12.0–15.0)
LYMPHS ABS: 3.9 10*3/uL (ref 0.7–4.0)
LYMPHS PCT: 30 %
MCH: 28.3 pg (ref 26.0–34.0)
MCHC: 32.8 g/dL (ref 30.0–36.0)
MCV: 86.1 fL (ref 78.0–100.0)
Monocytes Absolute: 0.7 10*3/uL (ref 0.1–1.0)
Monocytes Relative: 6 %
NEUTROS PCT: 62 %
Neutro Abs: 8.2 10*3/uL — ABNORMAL HIGH (ref 1.7–7.7)
Platelets: 291 10*3/uL (ref 150–400)
RBC: 3.89 MIL/uL (ref 3.87–5.11)
RDW: 14.1 % (ref 11.5–15.5)
WBC: 13.1 10*3/uL — AB (ref 4.0–10.5)

## 2016-08-12 LAB — URINALYSIS, ROUTINE W REFLEX MICROSCOPIC
BILIRUBIN URINE: NEGATIVE
Glucose, UA: NEGATIVE mg/dL
Hgb urine dipstick: NEGATIVE
Ketones, ur: 15 mg/dL — AB
Leukocytes, UA: NEGATIVE
NITRITE: NEGATIVE
PH: 6 (ref 5.0–8.0)
Protein, ur: NEGATIVE mg/dL
SPECIFIC GRAVITY, URINE: 1.022 (ref 1.005–1.030)

## 2016-08-12 MED ORDER — LIDOCAINE HCL (PF) 1 % IJ SOLN
2.0000 mL | Freq: Once | INTRAMUSCULAR | Status: AC
  Administered 2016-08-12: 2 mL via INTRADERMAL
  Filled 2016-08-12: qty 30

## 2016-08-12 MED ORDER — IOPAMIDOL (ISOVUE-300) INJECTION 61%
INTRAVENOUS | Status: AC
  Administered 2016-08-12: 30 mL via JEJUNOSTOMY
  Filled 2016-08-12: qty 30

## 2016-08-12 MED ORDER — SODIUM CHLORIDE 0.9 % IJ SOLN
INTRAMUSCULAR | Status: AC
  Filled 2016-08-12: qty 50

## 2016-08-12 MED ORDER — IOPAMIDOL (ISOVUE-300) INJECTION 61%
INTRAVENOUS | Status: AC
  Filled 2016-08-12: qty 100

## 2016-08-12 NOTE — ED Triage Notes (Signed)
Pt reports falling and dislodging her feeding tube. Pt alsor reports dizziness and stumbling today.

## 2016-08-12 NOTE — ED Notes (Signed)
Nurse will draw labs with IV start.

## 2016-08-12 NOTE — Discharge Instructions (Signed)
Follow-up for increased fever. Follow-up with general surgery for further management of the tube and the infection. Return sooner if the swelling increases or you begin to feel worse.

## 2016-08-12 NOTE — ED Notes (Signed)
Attempted an IV x 2 with no success.

## 2016-08-12 NOTE — ED Provider Notes (Signed)
[facility] DEPT Provider Note   CSN: [id number] Arrival date & time: 08/12/16  1858     History   Chief Complaint Chief Complaint  Patient presents with  . Feeding Tube Dislodged  . Dizziness    HPI [name] is a 40 y.o. female.  HPI Patient has been feeling dizzy for a couple weeks. Felt more dizzy today. Around an hour prior to arrival she fell and dislodged her J-tube. It came all the way out. She is fed exclusively through the tube. She was seen at the surgical clinic earlier today for infection at the tube site. She states she was started on antibiotics. She states she was told to get better she would have to get a CAT scan. She's had dizziness for a while now. She's been seen by neurology for it and had normal MRIs of her head and neck. Nerve conduction study pending.   Past Medical History:  Diagnosis Date  . Anal fissure - posterior 10/16/2014  . Anxiety    doesn't take anything  . Asthma    has Albuterol inhaler as needed  . Bronchitis   . Chronic headache disorder 07/22/2016  . Clostridium difficile infection 04/20/2012  . Colitis   . Dehydration   . Depression    doesn't take any meds  . Family history of adverse reaction to anesthesia    pt mom gets sick  . GERD (gastroesophageal reflux disease)    takes Pantoprazole daily  . Headache   . Hiatal hernia   . History of blood transfusion    last transfusion was 04/04/2016=Benadryl was given d/t itching. States she always itches with transfusion.   Marland Kitchen History of bronchitis    > 2 yrs ago  . History of colon polyps    benign  . History of migraine    last one 05/01/16  . History of urinary tract infection   . IDA (iron deficiency anemia)   . Internal and external bleeding hemorrhoids 06/11/2014  . Joint pain   . Joint swelling   . Left sided chronic colitis - segmental 06/11/2014  . Migraine   . Motion sickness   . Nausea    takes Zofran as needed  . Obesity   . Oligouria   . Osteoarthritis     back  . Pneumonia    hx of-yrs ago  . Postoperative nausea and vomiting 01/21/2016  . TOBACCO USER 10/02/2009   Qualifier: Diagnosis of  By: Dimas Millin MD, Ellard Artis    . Transfusion history    last transfusion 6'16   . UC (ulcerative colitis) ([place])    supposed to be taking Lialda and Bentyl but has been off since gastric sleeve  . Ulcerative colitis ([place])   . Vertigo    doesn't take any meds    Patient Active Problem List   Diagnosis Date Noted  . Paresthesia 07/22/2016  . Chronic headache disorder 07/22/2016  . Memory difficulty 07/22/2016  . GI bleed 04/04/2016  . Hematochezia 04/04/2016  . 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 Postoperative nausea and vomiting 01/21/2016  . Morbid obesity with BMI of 45.0-49.9, adult ([place]) 01/14/2016  . Anemia, iron deficiency 10/01/2015  . Skin lesion 05/18/2015  . Anal fissure - posterior 10/16/2014  . Severe obesity (BMI >= 40) ([place]) 10/16/2014  . Left sided chronic colitis - segmental 06/11/2014  . Internal and external bleeding hemorrhoids 06/11/2014  . Asthma 04/26/2012  . Excessive or frequent menstruation 10/16/2011  . DEPRESSION, MILD 10/02/2009  .  INTERNAL HEMORRHOIDS 11/15/2007    Past Surgical History:  Procedure Laterality Date  . COLONOSCOPY  2007   for rectal bleeding; [facility]  . COLONOSCOPY N/A 06/11/2014   Procedure: COLONOSCOPY;  Surgeon: Gatha Mayer, MD;  Location: [facility];  Service: Endoscopy;  Laterality: N/A;  . DILATION AND CURETTAGE OF UTERUS    . ESOPHAGOGASTRODUODENOSCOPY (EGD) WITH PROPOFOL N/A 04/06/2016   Procedure: ESOPHAGOGASTRODUODENOSCOPY (EGD) WITH PROPOFOL;  Surgeon: Alphonsa Overall, MD;  Location: [facility];  Service: General;  Laterality: N/A;  . FOOT SURGERY Bilateral    x 2  . GASTROJEJUNOSTOMY N/A 05/11/2016   Procedure: LAPAROSCOPIC PLACEMENT  OF FEEDING  JEJUNOSTOMY TUBE;  Surgeon: Excell Seltzer, MD;  Location: [facility];  Service: General;  Laterality: N/A;  . HEMORRHOIDECTOMY  WITH HEMORRHOID BANDING    . IR GENERIC HISTORICAL  05/19/2016   IR CM INJ ANY COLONIC TUBE W/FLUORO 05/19/2016 Aletta Edouard, MD [facility]  . KNEE ARTHROSCOPY Left 09/06/2014  . LAPAROSCOPIC GASTRIC SLEEVE RESECTION N/A 01/14/2016   Procedure: LAPAROSCOPIC GASTRIC SLEEVE RESECTION;  Surgeon: Excell Seltzer, MD;  Location: [facility];  Service: General;  Laterality: N/A;  . LAPAROSCOPIC TUBAL LIGATION  10/16/2011   Procedure: LAPAROSCOPIC TUBAL LIGATION;  Surgeon: Alwyn Pea, MD;  Location: [facility];  Service: Gynecology;  Laterality: Bilateral;  . NOVASURE ABLATION  09/28/2010   mild persistent vaginal bleeding  . right knee arthroscopy     05/07/2016  . wisdom teeth extracted      OB History    No data available       Home Medications    Prior to Admission medications   Medication Sig Start Date End Date Taking? Authorizing Provider  albuterol (PROVENTIL HFA;VENTOLIN HFA) 108 (90 Base) MCG/ACT inhaler Inhale 2 puffs into the lungs every 6 (six) hours as needed for wheezing or shortness of breath. For shortness of breath 09/30/15   Wardell Honour, MD  clotrimazole-betamethasone (LOTRISONE) cream Apply 1 application topically 2 (two) times daily. 07/25/16   Forrest Moron, MD  [facility] B-1 100 MG tablet FINELY CRUSH TAB AND ADMINSTER THROUGH FEEDING AFTER MIXING WITH LIQUID. THEN FLUSH TUBE WITH WATER 06/11/16   Historical Provider, MD  cyanocobalamin (,VITAMIN B-12,) 1000 MCG/ML injection Inject 1,000 mcg into the muscle every 30 (thirty) days. Reported on 04/01/2016    Historical Provider, MD  diclofenac sodium (VOLTAREN) 1 % GEL Apply 1 application topically 4 (four) times daily as needed (For knee pain.). Reported on 04/01/2016    Historical Provider, MD  diltiazem 2 % GEL Apply 1 application topically 3 (three) times daily. 07/29/16   Gatha Mayer, MD  EPINEPHrine 0.3 mg/0.3 mL IJ SOAJ injection Inject 0.3 mg into the muscle once as needed (For anaphylaxis.). Reported on 04/01/2016     Historical Provider, MD  fluticasone (FLONASE) 50 MCG/ACT nasal spray Place 1 spray into both nostrils daily as needed for allergies. Reported on 04/01/2016    Historical Provider, MD  Multiple Vitamin (MULTIVITAMIN) LIQD Place 15 mLs into feeding tube daily. 05/15/16   Excell Seltzer, MD  Nutritional Supplements (FEEDING SUPPLEMENT, OSMOLITE 1.2 CAL,) LIQD Place 1,000 mLs into feeding tube continuous.    Historical Provider, MD  ondansetron (ZOFRAN-ODT) 8 MG disintegrating tablet Take 1 tablet (8 mg total) by mouth every 8 (eight) hours as needed for nausea. 03/25/16   Wardell Honour, MD  pantoprazole (PROTONIX) 40 MG tablet Take 40 mg by mouth 2 (two) times daily. Reported on 04/01/2016 01/09/16  Historical Provider, MD    Family History Family History  Problem Relation Age of Onset  . Hypertension Mother   . Diabetes Mother   . Sarcoidosis Mother     lungs and skin  . Asthma Mother   . Hypertension Father   . Diabetes Father   . Asthma Son   . Tics Son   . Asthma Sister   . Cancer Sister     possible pancreatic cancer  . Adrenal disorder Sister     Tumor   . Asthma Brother   . Asthma Daughter     died age 278.5  . Cancer Daughter 4    brain; died age 278.5  . Asthma Son   . Cancer Paternal Aunt     brain, colon, lung and esophagus; unsure of primary     Social History Social History  Substance Use Topics  . Smoking status: Former Smoker    Packs/day: 0.50    Years: 20.00    Types: Cigarettes    Quit date: 03/17/2014  . Smokeless tobacco: Never Used  . Alcohol use No     Allergies   Oxycodone; Ciprofloxacin; Doxycycline; Morphine and related; Other; and Penicillins   Review of Systems Review of Systems  Constitutional: Negative for fever.  HENT: Negative for congestion.   Eyes: Negative for visual disturbance.  Respiratory: Negative for shortness of breath.   Cardiovascular: Negative for chest pain.  Gastrointestinal: Positive for abdominal pain.  Genitourinary:  Negative for flank pain.  Skin: Positive for wound.  Neurological: Positive for dizziness.  Hematological: Negative for adenopathy.     Physical Exam Updated Vital Signs BP 104/72 (BP Location: Left Arm)   Pulse 92   Temp 98.1 F (36.7 C) (Oral)   Resp 18   SpO2 99%   Physical Exam  Constitutional: She appears well-developed.  HENT:  Head: Atraumatic.  Eyes: EOM are normal.  Neck: Neck supple.  Cardiovascular: Normal rate.   Pulmonary/Chest: Effort normal.  Abdominal: There is tenderness.  Left upper quadrant site of J-tube has purulent drainage. There is tenderness to the superior and somewhat lateral aspect of the ostomy that progresses around 5-7 cm.  Musculoskeletal: She exhibits no edema.  Neurological: She is alert.  Skin: Skin is warm.  Psychiatric: She has a normal mood and affect.     ED Treatments / Results  Labs (all labs ordered are listed, but only abnormal results are displayed) Labs Reviewed  CBC WITH DIFFERENTIAL/PLATELET - Abnormal; Notable for the following:       Result Value   WBC 13.1 (*)    Hemoglobin 11.0 (*)    HCT 33.5 (*)    Neutro Abs 8.2 (*)    All other components within normal limits  URINALYSIS, ROUTINE W REFLEX MICROSCOPIC (NOT AT [facility])  I-STAT CHEM 8, ED    EKG  EKG Interpretation None       Radiology No results found.  Procedures J tube replacement Date/Time: 08/12/2016 11:28 PM Performed by: Davonna Belling Authorized by: Davonna Belling  Consent: Verbal consent obtained. Written consent not obtained. Risks and benefits: risks, benefits and alternatives were discussed Consent given by: patient Patient understanding: patient states understanding of the procedure being performed Required items: required blood products, implants, devices, and special equipment available Patient identity confirmed: verbally with patient Preparation: Patient was prepped and draped in the usual sterile fashion. Local anesthesia  used: yes Anesthesia: local infiltration  Anesthesia: Local anesthesia used: yes Local Anesthetic: lidocaine 1% without epinephrine  Anesthetic total: 2 (for suture of tube) mL  Sedation: Patient sedated: no Comments: 14 French red rubber tube placed. Sutured into place. Positioning confirmed with x-ray.    (including critical care time)  Medications Ordered in ED Medications  iopamidol (ISOVUE-300) 61 % injection (not administered)  sodium chloride 0.9 % injection (not administered)  lidocaine (PF) (XYLOCAINE) 1 % injection 2 mL (2 mLs Intradermal Given by Other 08/12/16 2252)  iopamidol (ISOVUE-300) 61 % injection (30 mLs Per J Tube Contrast Given 08/12/16 2303)     Initial Impression / Assessment and Plan / ED Course  I have reviewed the triage vital signs and the nursing notes.  Pertinent labs & imaging results that were available during my care of the patient were reviewed by me and considered in my medical decision making (see chart for details).  Clinical Course   Patient dislodged her J-tube. Replace after discussion with general surgery. X-ray reviewed but official read pending. White count is mildly elevated was likely infection at the site but the swelling has decreased. Already on antibiotics by general surgery. Likely discharge home. Care turned over to Dr.  Waverly Ferrari Final Clinical Impressions(s) / ED Diagnoses   Final diagnoses:  Jejunostomy tube fell out  Jejunostomy site infection [facility])    New Prescriptions New Prescriptions   No medications on file     Davonna Belling, MD 08/12/16 2337

## 2016-08-12 NOTE — ED Notes (Signed)
Pt in radiology 

## 2016-08-26 ENCOUNTER — Ambulatory Visit: Payer: 59 | Admitting: Family Medicine

## 2016-08-26 ENCOUNTER — Ambulatory Visit (INDEPENDENT_AMBULATORY_CARE_PROVIDER_SITE_OTHER): Payer: 59 | Admitting: Physician Assistant

## 2016-08-26 DIAGNOSIS — E538 Deficiency of other specified B group vitamins: Secondary | ICD-10-CM

## 2016-09-16 ENCOUNTER — Ambulatory Visit (INDEPENDENT_AMBULATORY_CARE_PROVIDER_SITE_OTHER): Payer: Self-pay | Admitting: Neurology

## 2016-09-16 ENCOUNTER — Ambulatory Visit (INDEPENDENT_AMBULATORY_CARE_PROVIDER_SITE_OTHER): Payer: 59 | Admitting: Neurology

## 2016-09-16 ENCOUNTER — Encounter: Payer: Self-pay | Admitting: Neurology

## 2016-09-16 DIAGNOSIS — R202 Paresthesia of skin: Secondary | ICD-10-CM

## 2016-09-16 NOTE — Progress Notes (Signed)
Please refer to EMG and nerve conduction study procedure note. 

## 2016-09-16 NOTE — Procedures (Signed)
     HISTORY:  [name] is a 40 year old patient with a history of bariatric surgery with severe weight loss following surgery, the patient has required a feeding tube. She has developed paresthesias on all 4 extremities, and some gait instability. She is being evaluated for a peripheral neuropathy.  NERVE CONDUCTION STUDIES:  Nerve conduction studies were performed on the left upper extremity. The distal motor latencies and motor amplitudes for the median and ulnar nerves were normal with slight slowing seen for these nerves. The sensory latencies for the median and ulnar nerves were within normal limits on the left. The F wave latency for the left median nerve was normal.  Nerve conduction studies were performed on both lower extremities. The distal motor latencies for the peroneal nerves were within normal limits bilaterally with normal motor amplitudes for these nerves. Nerve conduction velocities on the left were normal, slightly slowed on the right. The distal motor latency for the left posterior tibial nerve was prolonged with a low motor amplitude, the distal motor latency for the right posterior tibial nerve was normal with a low motor amplitude. Slowing was seen for these nerves bilaterally. The sural sensory latencies were prolonged bilaterally with slightly low sensory amplitudes. The saphenous sensory latencies were unobtainable bilaterally. The peroneal sensory latencies were within normal limits bilaterally with normal amplitudes bilaterally. The F wave latencies for the posterior tibial nerves were prolonged bilaterally.  EMG STUDIES:  EMG study was performed on the left lower extremity:  The tibialis anterior muscle reveals 2 to 4K motor units with slightly decreased recruitment. No fibrillations or positive waves were seen. The peroneus tertius muscle reveals 2 to 5K motor units with decreased recruitment. No fibrillations or positive waves were seen. The medial  gastrocnemius muscle reveals 1 to 3K motor units with full recruitment. No fibrillations or positive waves were seen. The vastus lateralis muscle reveals 2 to 4K motor units with full recruitment. No fibrillations or positive waves were seen. The iliopsoas muscle reveals 2 to 4K motor units with full recruitment. No fibrillations or positive waves were seen. The biceps femoris muscle (long head) reveals 2 to 4K motor units with full recruitment. No fibrillations or positive waves were seen. The lumbosacral paraspinal muscles were tested at 3 levels, and revealed no abnormalities of insertional activity at all 3 levels tested. There was good relaxation.   IMPRESSION:  Nerve conduction studies done on the left upper extremity and both lower extremities shows evidence of what appears to be a mild sensorimotor peripheral neuropathy. EMG evaluation of the left lower extremity does show some mild distal chronic stable signs of denervation consistent with the diagnosis of peripheral neuropathy. There is no evidence of an overlying lumbosacral radiculopathy.  Jill Alexanders MD 09/16/2016 5:41 PM  [facility] [address] [place], [place] [address]  Phone [phone] Fax [phone]

## 2016-09-18 ENCOUNTER — Other Ambulatory Visit (HOSPITAL_COMMUNITY): Payer: Self-pay | Admitting: General Surgery

## 2016-09-18 ENCOUNTER — Ambulatory Visit (HOSPITAL_COMMUNITY)
Admission: RE | Admit: 2016-09-18 | Discharge: 2016-09-18 | Disposition: A | Discharge status: Home / Self Care | Payer: 59 | Source: Ambulatory Visit | Attending: General Surgery | Admitting: General Surgery

## 2016-09-18 DIAGNOSIS — K9423 Gastrostomy malfunction: Secondary | ICD-10-CM | POA: Diagnosis present

## 2016-09-18 DIAGNOSIS — K9420 Gastrostomy complication, unspecified: Secondary | ICD-10-CM

## 2016-09-18 DIAGNOSIS — Z934 Other artificial openings of gastrointestinal tract status: Secondary | ICD-10-CM | POA: Diagnosis not present

## 2016-09-18 MED ORDER — IOPAMIDOL (ISOVUE-300) INJECTION 61%: 50.0000 mL | Freq: Once | INTRAVENOUS | Status: DC | PRN

## 2016-09-18 MED ORDER — IOPAMIDOL (ISOVUE-300) INJECTION 61%
INTRAVENOUS | Status: AC
  Filled 2016-09-18: qty 50

## 2016-09-19 LAB — MULTIPLE MYELOMA PANEL, SERUM
ALPHA2 GLOB SERPL ELPH-MCNC: 0.6 g/dL (ref 0.4–1.0)
Albumin SerPl Elph-Mcnc: 3.5 g/dL (ref 2.9–4.4)
Albumin/Glob SerPl: 1.1 (ref 0.7–1.7)
Alpha 1: 0.2 g/dL (ref 0.0–0.4)
B-Globulin SerPl Elph-Mcnc: 1.1 g/dL (ref 0.7–1.3)
Gamma Glob SerPl Elph-Mcnc: 1.5 g/dL (ref 0.4–1.8)
Globulin, Total: 3.4 g/dL (ref 2.2–3.9)
IGG (IMMUNOGLOBIN G), SERUM: 1302 mg/dL (ref 700–1600)
IGM (IMMUNOGLOBULIN M), SRM: 119 mg/dL (ref 26–217)
IgA/Immunoglobulin A, Serum: 288 mg/dL (ref 87–352)
TOTAL PROTEIN: 6.9 g/dL (ref 6.0–8.5)

## 2016-09-19 LAB — SEDIMENTATION RATE: Sed Rate: 32 mm/hr (ref 0–32)

## 2016-09-19 LAB — VITAMIN B12: Vitamin B-12: 755 pg/mL (ref 232–1245)

## 2016-09-22 ENCOUNTER — Encounter: Payer: Self-pay | Admitting: Family Medicine

## 2016-09-22 ENCOUNTER — Ambulatory Visit (INDEPENDENT_AMBULATORY_CARE_PROVIDER_SITE_OTHER): Payer: 59 | Admitting: Family Medicine

## 2016-09-22 VITALS — BP 126/79 | HR 89 | Temp 98.6°F | Resp 16 | Ht 67.0 in | Wt 208.0 lb

## 2016-09-22 DIAGNOSIS — E538 Deficiency of other specified B group vitamins: Secondary | ICD-10-CM

## 2016-09-22 DIAGNOSIS — R112 Nausea with vomiting, unspecified: Secondary | ICD-10-CM

## 2016-09-22 DIAGNOSIS — Z9889 Other specified postprocedural states: Secondary | ICD-10-CM | POA: Diagnosis not present

## 2016-09-22 DIAGNOSIS — J4521 Mild intermittent asthma with (acute) exacerbation: Secondary | ICD-10-CM | POA: Diagnosis not present

## 2016-09-22 DIAGNOSIS — R202 Paresthesia of skin: Secondary | ICD-10-CM

## 2016-09-22 DIAGNOSIS — D508 Other iron deficiency anemias: Secondary | ICD-10-CM | POA: Diagnosis not present

## 2016-09-22 DIAGNOSIS — J301 Allergic rhinitis due to pollen: Secondary | ICD-10-CM | POA: Diagnosis not present

## 2016-09-22 DIAGNOSIS — Z9884 Bariatric surgery status: Secondary | ICD-10-CM

## 2016-09-22 MED ORDER — ALBUTEROL SULFATE HFA 108 (90 BASE) MCG/ACT IN AERS: 2.0000 | INHALATION_SPRAY | Freq: Four times a day (QID) | RESPIRATORY_TRACT | 0 refills | Status: DC | PRN

## 2016-09-22 MED ORDER — CYANOCOBALAMIN 1000 MCG/ML IJ SOLN
1000.0000 ug | INTRAMUSCULAR | Status: DC
  Administered 2016-09-22: 1000 ug via INTRAMUSCULAR

## 2016-09-22 NOTE — Patient Instructions (Signed)
     IF you received an x-ray today, you will receive an invoice from [facility]. Please contact [facility] at [phone] with questions or concerns regarding your invoice.   IF you received labwork today, you will receive an invoice from [facility]. Please contact [facility] at [phone] with questions or concerns regarding your invoice.   Our billing staff will not be able to assist you with questions regarding bills from these companies.  You will be contacted with the lab results as soon as they are available. The fastest way to get your results is to activate your My Chart account. Instructions are located on the last page of this paperwork. If you have not heard from us regarding the results in 2 weeks, please contact this office.     

## 2016-09-22 NOTE — Progress Notes (Signed)
Subjective:    Patient ID: [name], female    DOB: 1976-09-19, 40 y.o.   MRN: [id number]  09/22/2016  B12 shot (patient states its time for her shot); throat itches (x2 days; states her throat does not hurt but itches rather. Patient indicated she was coughing but not anymore); and Ear Pain (x2 days; right ear pain; may be associated with throat issue)   HPI This 40 y.o. female presents for throat itching and ear pain as well as B12 injection.  Last visit 04/01/2016.  Highest weight ever 350 pounds.  Weight at CPE on 05/18/15 320.  Feeding tube placed 04/2016.  Gastric bypass 12/2015.  Still has feeding tube in place; still unable to eat anything; was able to do minimal amounts of protein shake and water; now feeding 100% through the tube.  Currently on 24 hour feed; currently feeding tube infected; currently on abx.  Still not at feeding tube rate that surgeon wants.  Increased to 60cc/hour.   Every time increases amount or rate of feeds, tube gets infected.  Goal weight is 180.  Needs B12 injection.   Has gotten an elliptical; exercises some.  Having weakness in arms.  Arms hurt.  No hair; hair loss.  Has two pound weights that use when commercials come on.  Has been doing two pound weights since surgery.     Cough: onset two days; hurts to cough; throat is itchy; ear is itchy.  No fever/chills/sweats.  Cold all the time.  Mild rhinorrhea; mild nasal congestion; +coughing; hurts to cough upper airways.  No SOB.  Taking Albuterol as needed; coughs when taking a deep breath.  Asthma; uses inhaler daily.    Paresthesias: s/p two studies; C spine MRI and MRI brain; negative; s/p EMG/NCS; +neuropathy due to rapid weight loss and malnutrition.  Vitamin B12 normal on 09/16/16.  Taking Thiamine oral supplement; also MVI is important.   Immunization History  Administered Date(s) Administered  . Influenza,inj,Quad PF,36+ Mos 09/02/2015, 07/21/2016  . Influenza-Unspecified 07/30/2014  . Tdap  05/18/2015   BP Readings from Last 3 Encounters:  09/22/16 126/79  08/13/16 98/57  07/29/16 130/86   Wt Readings from Last 3 Encounters:  09/22/16 208 lb (94.3 kg)  07/29/16 216 lb 9.6 oz (98.2 kg)  07/25/16 216 lb (98 kg)      Review of Systems  Constitutional: Negative for chills, diaphoresis, fatigue and fever.  HENT: Positive for congestion, ear pain, postnasal drip and rhinorrhea. Negative for sinus pain.   Eyes: Negative for visual disturbance.  Respiratory: Positive for cough. Negative for shortness of breath and wheezing.   Cardiovascular: Negative for chest pain, palpitations and leg swelling.  Gastrointestinal: Negative for abdominal pain, constipation, diarrhea, nausea and vomiting.  Endocrine: Negative for cold intolerance, heat intolerance, polydipsia, polyphagia and polyuria.  Neurological: Positive for numbness. Negative for dizziness, tremors, seizures, syncope, facial asymmetry, speech difficulty, weakness, light-headedness and headaches.    Past Medical History:  Diagnosis Date  . Anal fissure - posterior 10/16/2014  . Anxiety    doesn't take anything  . Asthma    has Albuterol inhaler as needed  . Bronchitis   . Chronic headache disorder 07/22/2016  . Clostridium difficile infection 04/20/2012  . Colitis   . Dehydration   . Depression    doesn't take any meds  . Family history of adverse reaction to anesthesia    pt mom gets sick  . GERD (gastroesophageal reflux disease)    takes Pantoprazole daily  .  Headache   . Hiatal hernia   . History of blood transfusion    last transfusion was 04/04/2016=Benadryl was given d/t itching. States she always itches with transfusion.   Marland Kitchen History of bronchitis    > 2 yrs ago  . History of colon polyps    benign  . History of migraine    last one 05/01/16  . History of urinary tract infection   . IDA (iron deficiency anemia)   . Internal and external bleeding hemorrhoids 06/11/2014  . Joint pain   . Joint swelling    . Left sided chronic colitis - segmental 06/11/2014  . Migraine   . Motion sickness   . Nausea    takes Zofran as needed  . Obesity   . Oligouria   . Osteoarthritis    back  . Pneumonia    hx of-yrs ago  . Postoperative nausea and vomiting 01/21/2016  . TOBACCO USER 10/02/2009   Qualifier: Diagnosis of  By: Dimas Millin MD, Ellard Artis    . Transfusion history    last transfusion 6'16   . UC (ulcerative colitis) ([place])    supposed to be taking Lialda and Bentyl but has been off since gastric sleeve  . Ulcerative colitis ([place])   . Vertigo    doesn't take any meds   Past Surgical History:  Procedure Laterality Date  . COLONOSCOPY  2007   for rectal bleeding; [facility]  . COLONOSCOPY N/A 06/11/2014   Procedure: COLONOSCOPY;  Surgeon: Gatha Mayer, MD;  Location: [facility];  Service: Endoscopy;  Laterality: N/A;  . DILATION AND CURETTAGE OF UTERUS    . ESOPHAGOGASTRODUODENOSCOPY (EGD) WITH PROPOFOL N/A 04/06/2016   Procedure: ESOPHAGOGASTRODUODENOSCOPY (EGD) WITH PROPOFOL;  Surgeon: Alphonsa Overall, MD;  Location: [facility];  Service: General;  Laterality: N/A;  . FOOT SURGERY Bilateral    x 2  . GASTROJEJUNOSTOMY N/A 05/11/2016   Procedure: LAPAROSCOPIC PLACEMENT  OF FEEDING  JEJUNOSTOMY TUBE;  Surgeon: Excell Seltzer, MD;  Location: [facility];  Service: General;  Laterality: N/A;  . HEMORRHOIDECTOMY WITH HEMORRHOID BANDING    . IR GENERIC HISTORICAL  05/19/2016   IR CM INJ ANY COLONIC TUBE W/FLUORO 05/19/2016 Aletta Edouard, MD [facility]  . KNEE ARTHROSCOPY Left 09/06/2014  . LAPAROSCOPIC GASTRIC SLEEVE RESECTION N/A 01/14/2016   Procedure: LAPAROSCOPIC GASTRIC SLEEVE RESECTION;  Surgeon: Excell Seltzer, MD;  Location: [facility];  Service: General;  Laterality: N/A;  . LAPAROSCOPIC TUBAL LIGATION  10/16/2011   Procedure: LAPAROSCOPIC TUBAL LIGATION;  Surgeon: Alwyn Pea, MD;  Location: [facility];  Service: Gynecology;  Laterality: Bilateral;  . NOVASURE ABLATION  09/28/2010   mild  persistent vaginal bleeding  . right knee arthroscopy     05/07/2016  . wisdom teeth extracted     Allergies  Allergen Reactions  . Oxycodone Anaphylaxis  . Ciprofloxacin Itching and Rash  . Doxycycline Nausea And Vomiting  . Morphine And Related Swelling and Other (See Comments)    Pt reported throat swelling to morphine.  Has tolerated Norco and Dilaudid.  . Other     Needs paper tape  . Penicillins Rash and Other (See Comments)    .Marland KitchenHas patient had a PCN reaction causing immediate rash, facial/tongue/throat swelling, SOB or lightheadedness with hypotension: Yes Has patient had a PCN reaction causing severe rash involving mucus membranes or skin necrosis: No Has patient had a PCN reaction that required hospitalization UNKNOWN - childhood allergy  Has patient had a PCN reaction occurring within the last  10 years: No If all of the above answers are "NO", then may proceed with Cephalosporin use.     Social History   Social History  . Marital status: Single    Spouse name: N/A  . Number of children: 3  . Years of education: Some college   Occupational History  . [employer] and [employer]     Currently out on disability d/t surg   Social History Main Topics  . Smoking status: Former Smoker    Packs/day: 0.50    Years: 20.00    Types: Cigarettes    Quit date: 03/17/2014  . Smokeless tobacco: Never Used  . Alcohol use No  . Drug use: No  . Sexual activity: Not Currently   Other Topics Concern  . Not on file   Social History Narrative   Marital status:  Single; not dating seriously.      Children:  3 sons (18, 15, 99) whose father is deceased, 1 daughter (deceased); no grandchildren      Lives:  With 2 sons      Employment:  Full time; Corporate treasurer at [employer]      Tobacco: quit June 2015      Alcohol:  None      Drugs:  None      Exercise:  Leg lifts for knee strengthening; walking   1 caffeine beverage daily      Sexual activity:  Active; total partners = up  there.  No STDs in past.        Right-handed      Family History  Problem Relation Age of Onset  . Hypertension Mother   . Diabetes Mother   . Sarcoidosis Mother     lungs and skin  . Asthma Mother   . Hypertension Father   . Diabetes Father   . Asthma Son   . Tics Son   . Asthma Sister   . Cancer Sister     possible pancreatic cancer  . Adrenal disorder Sister     Tumor   . Asthma Brother   . Asthma Daughter     died age 97.5  . Cancer Daughter 4    brain; died age 97.5  . Asthma Son   . Cancer Paternal Aunt     brain, colon, lung and esophagus; unsure of primary        Objective:    BP 126/79 (BP Location: Right Arm, Patient Position: Sitting, Cuff Size: Normal)   Pulse 89   Temp 98.6 F (37 C) (Oral)   Resp 16   Ht 5' 7"  (1.702 m)   Wt 208 lb (94.3 kg)   SpO2 100%   BMI 32.58 kg/m  Physical Exam  Constitutional: She is oriented to person, place, and time. She appears well-developed and well-nourished. No distress.  HENT:  Head: Normocephalic and atraumatic.  Right Ear: External ear normal.  Left Ear: External ear normal.  Nose: Nose normal.  Mouth/Throat: Oropharynx is clear and moist.  Eyes: Conjunctivae and EOM are normal. Pupils are equal, round, and reactive to light.  Neck: Normal range of motion. Neck supple. Carotid bruit is not present. No thyromegaly present.  Cardiovascular: Normal rate, regular rhythm, normal heart sounds and intact distal pulses.  Exam reveals no gallop and no friction rub.   No murmur heard. Pulmonary/Chest: Effort normal and breath sounds normal. She has no wheezes. She has no rales.  Abdominal: Soft. Bowel sounds are normal. She exhibits no distension and  no mass. There is no tenderness. There is no rebound and no guarding.  Lymphadenopathy:    She has no cervical adenopathy.  Neurological: She is alert and oriented to person, place, and time. No cranial nerve deficit.  Skin: Skin is warm and dry. No rash noted. She is not  diaphoretic. No erythema. No pallor.  Psychiatric: She has a normal mood and affect. Her behavior is normal.    Depression screen [facility] 2/9 09/22/2016 07/25/2016 07/21/2016 06/05/2016 04/01/2016  Decreased Interest 0 0 0 0 0  Down, Depressed, Hopeless 0 0 0 0 0  PHQ - 2 Score 0 0 0 0 0  Altered sleeping - - - - -  Tired, decreased energy - - - - -  Change in appetite - - - - -  Feeling bad or failure about yourself  - - - - -  Trouble concentrating - - - - -  Moving slowly or fidgety/restless - - - - -  Suicidal thoughts - - - - -  PHQ-9 Score - - - - -  Difficult doing work/chores - - - - -       Assessment & Plan:   1. Mild intermittent asthma with acute exacerbation   2. Postoperative nausea and vomiting   3. Iron deficiency anemia secondary to inadequate dietary iron intake   4. Paresthesia   5. S/P laparoscopic sleeve gastrectomy   6. Severe obesity (BMI >= 40) (HCC)   7. Vitamin B12 deficiency   8. Acute seasonal allergic rhinitis due to pollen    -New onset cough due to allergic rhinitis and asthma exacerbation.  Increase Albuterol to qid PRN; also start oral antihistamine daily. -maintained on feeding tube s/p sleeve gastrectomy. -s/p B12 injection in office.    Orders Placed This Encounter  Procedures  . Spirometry: [facility]   Meds ordered this encounter  Medications  . cephALEXin (KEFLEX) 250 MG/5ML suspension    Sig: Take 250 mg by mouth 6 (six) times daily.  Marland Kitchen albuterol (PROVENTIL HFA;VENTOLIN HFA) 108 (90 Base) MCG/ACT inhaler    Sig: Inhale 2 puffs into the lungs every 6 (six) hours as needed for wheezing or shortness of breath. For shortness of breath    Dispense:  54 g    Refill:  0    Dispense: 3 inhalers  . cyanocobalamin ((VITAMIN B-12)) injection 1,000 mcg    Return in about 4 months (around 01/21/2017) for recheck.   Jhonathan Desroches Elayne Guerin, M.D. [facility] [address] [place], [place]  [address] [phone]  phone [phone] fax

## 2016-09-28 DIAGNOSIS — R269 Unspecified abnormalities of gait and mobility: Secondary | ICD-10-CM | POA: Diagnosis not present

## 2016-09-28 DIAGNOSIS — Z9884 Bariatric surgery status: Secondary | ICD-10-CM | POA: Diagnosis not present

## 2016-09-28 DIAGNOSIS — K922 Gastrointestinal hemorrhage, unspecified: Secondary | ICD-10-CM | POA: Diagnosis not present

## 2016-10-15 DIAGNOSIS — Z9884 Bariatric surgery status: Secondary | ICD-10-CM | POA: Diagnosis not present

## 2016-10-15 DIAGNOSIS — R269 Unspecified abnormalities of gait and mobility: Secondary | ICD-10-CM | POA: Diagnosis not present

## 2016-10-15 DIAGNOSIS — K922 Gastrointestinal hemorrhage, unspecified: Secondary | ICD-10-CM | POA: Diagnosis not present

## 2016-10-29 DIAGNOSIS — K922 Gastrointestinal hemorrhage, unspecified: Secondary | ICD-10-CM | POA: Diagnosis not present

## 2016-10-29 DIAGNOSIS — R269 Unspecified abnormalities of gait and mobility: Secondary | ICD-10-CM | POA: Diagnosis not present

## 2016-10-29 DIAGNOSIS — Z9884 Bariatric surgery status: Secondary | ICD-10-CM | POA: Diagnosis not present

## 2016-11-13 ENCOUNTER — Ambulatory Visit: Payer: Self-pay | Admitting: General Surgery

## 2016-11-13 DIAGNOSIS — Z9884 Bariatric surgery status: Secondary | ICD-10-CM | POA: Diagnosis not present

## 2016-11-13 MED ORDER — ERTAPENEM SODIUM 1 G IJ SOLR: 1.0000 g | Freq: Once | INTRAMUSCULAR | Status: DC

## 2016-11-15 DIAGNOSIS — K922 Gastrointestinal hemorrhage, unspecified: Secondary | ICD-10-CM | POA: Diagnosis not present

## 2016-11-15 DIAGNOSIS — Z9884 Bariatric surgery status: Secondary | ICD-10-CM | POA: Diagnosis not present

## 2016-11-15 DIAGNOSIS — R269 Unspecified abnormalities of gait and mobility: Secondary | ICD-10-CM | POA: Diagnosis not present

## 2016-11-17 ENCOUNTER — Other Ambulatory Visit (HOSPITAL_COMMUNITY): Payer: Self-pay | Admitting: General Surgery

## 2016-11-17 DIAGNOSIS — Z9884 Bariatric surgery status: Secondary | ICD-10-CM

## 2016-11-26 ENCOUNTER — Ambulatory Visit (HOSPITAL_COMMUNITY)
Admission: RE | Admit: 2016-11-26 | Discharge: 2016-11-26 | Disposition: A | Discharge status: Home / Self Care | Payer: 59 | Source: Ambulatory Visit | Attending: General Surgery | Admitting: General Surgery

## 2016-11-26 ENCOUNTER — Other Ambulatory Visit (HOSPITAL_COMMUNITY): Payer: Self-pay | Admitting: General Surgery

## 2016-11-26 DIAGNOSIS — Z9884 Bariatric surgery status: Secondary | ICD-10-CM

## 2016-11-26 DIAGNOSIS — K449 Diaphragmatic hernia without obstruction or gangrene: Secondary | ICD-10-CM | POA: Insufficient documentation

## 2016-12-09 NOTE — Progress Notes (Signed)
B12 injection

## 2016-12-09 NOTE — Patient Instructions (Addendum)
[name]  12/09/2016   Your procedure is scheduled on: Monday 12/14/16  Report to [facility]  Entrance take [facility]  elevators to 3rd floor to  [place] at  0900 AM.  Call this number if you have problems the morning of surgery [phone]   Remember: ONLY 1 PERSON MAY GO WITH YOU TO SHORT STAY TO GET  READY MORNING OF [place].   Follow bowel prep instructions from Dr. Lear Ng office the day before surgery on Sunday 12/13/16 along with a clear liquid diet all day until midnight!  No tube feeding after Saturday 12/12/16!     CLEAR LIQUID DIET   Foods Allowed                                                                     Foods Excluded  Coffee and tea, regular and decaf                             liquids that you cannot  Plain Jell-O in any flavor                                             see through such as: Fruit ices (not with fruit pulp)                                     milk, soups, orange juice  Iced Popsicles                                    All solid food Carbonated beverages, regular and diet                                    Cranberry, grape and apple juices Sports drinks like Gatorade Lightly seasoned clear broth or consume(fat free) Sugar, honey syrup  Sample Menu Breakfast                                Lunch                                     Supper Cranberry juice                    Beef broth                            Chicken broth Jell-O  Grape juice                           Apple juice Coffee or tea                        Jell-O                                      Popsicle                                                Coffee or tea                        Coffee or tea  _____________________________________________________________________     Do not eat food or drink liquids :After Midnight.      Take these medicines the morning of surgery with A SIP OF  WATER:  Pantoprazole (Protonix) . You may also take your Albuterol Inhaler if needed, and bring  with you morning of surgery.                               You may not have any metal on your body including hair pins and              piercings  Do not wear jewelry, make-up, lotions, powders or perfumes, deodorant             Do not wear nail polish.  Do not shave  48 hours prior to surgery.              Men may shave face and neck.   Do not bring valuables to the hospital. [place].  Contacts, dentures or bridgework may not be worn into surgery.  Leave suitcase in the car. After surgery it may be brought to your room.                  Please read over the following fact sheets you were given            ___________________________________________________________________  [facility] - Preparing for Surgery Before surgery, you can play an important role.  Because skin is not sterile, your skin needs to be as free of germs as possible.  You can reduce the number of germs on your skin by washing with CHG (chlorahexidine gluconate) soap before surgery.  CHG is an antiseptic cleaner which kills germs and bonds with the skin to continue killing germs even after washing. Please DO NOT use if you have an allergy to CHG or antibacterial soaps.  If your skin becomes reddened/irritated stop using the CHG and inform your nurse when you arrive at Short Stay. Do not shave (including legs and underarms) for at least 48 hours prior to the first CHG shower.  You may shave your face/neck. Please follow these instructions carefully:  1.  Shower with CHG Soap the night before surgery and the  morning of Surgery.  2.  If you choose to wash your hair, wash your hair  first as usual with your  normal  shampoo.  3.  After you shampoo, rinse your hair and body thoroughly to remove the  shampoo.                           4.  Use CHG as you would any other liquid soap.   You can apply chg directly  to the skin and wash                       Gently with a scrungie or clean washcloth.  5.  Apply the CHG Soap to your body ONLY FROM THE NECK DOWN.   Do not use on face/ open                           Wound or open sores. Avoid contact with eyes, ears mouth and genitals (private parts).                       Wash face,  Genitals (private parts) with your normal soap.             6.  Wash thoroughly, paying special attention to the area where your surgery  will be performed.  7.  Thoroughly rinse your body with warm water from the neck down.  8.  DO NOT shower/wash with your normal soap after using and rinsing off  the CHG Soap.                9.  Pat yourself dry with a clean towel.            10.  Wear clean pajamas.            11.  Place clean sheets on your bed the night of your first shower and do not  sleep with pets. Day of Surgery : Do not apply any lotions/deodorants the morning of surgery.  Please wear clean clothes to the hospital/surgery center.  FAILURE TO FOLLOW THESE INSTRUCTIONS MAY RESULT IN THE CANCELLATION OF YOUR SURGERY PATIENT SIGNATURE_________________________________  NURSE SIGNATURE__________________________________  ________________________________________________________________________

## 2016-12-10 ENCOUNTER — Encounter (HOSPITAL_COMMUNITY): Payer: Self-pay

## 2016-12-10 ENCOUNTER — Encounter (HOSPITAL_COMMUNITY)
Admission: RE | Admit: 2016-12-10 | Discharge: 2016-12-10 | Disposition: A | Discharge status: Home / Self Care | Payer: 59 | Source: Ambulatory Visit | Attending: General Surgery | Admitting: General Surgery

## 2016-12-10 DIAGNOSIS — Z9889 Other specified postprocedural states: Secondary | ICD-10-CM | POA: Diagnosis present

## 2016-12-10 DIAGNOSIS — Z9884 Bariatric surgery status: Secondary | ICD-10-CM | POA: Insufficient documentation

## 2016-12-10 DIAGNOSIS — Z01812 Encounter for preprocedural laboratory examination: Secondary | ICD-10-CM | POA: Diagnosis present

## 2016-12-10 DIAGNOSIS — Z4589 Encounter for adjustment and management of other implanted devices: Secondary | ICD-10-CM | POA: Insufficient documentation

## 2016-12-10 LAB — COMPREHENSIVE METABOLIC PANEL
ALBUMIN: 4.1 g/dL (ref 3.5–5.0)
ALT: 9 U/L — ABNORMAL LOW (ref 14–54)
AST: 17 U/L (ref 15–41)
Alkaline Phosphatase: 65 U/L (ref 38–126)
Anion gap: 5 (ref 5–15)
BUN: 10 mg/dL (ref 6–20)
CHLORIDE: 112 mmol/L — AB (ref 101–111)
CO2: 24 mmol/L (ref 22–32)
Calcium: 9.5 mg/dL (ref 8.9–10.3)
Creatinine, Ser: 0.83 mg/dL (ref 0.44–1.00)
GFR calc Af Amer: >60 mL/min (ref >60)
GFR calc non Af Amer: >60 mL/min (ref >60)
GLUCOSE: 78 mg/dL (ref 65–99)
POTASSIUM: 4.4 mmol/L (ref 3.5–5.1)
SODIUM: 141 mmol/L (ref 135–145)
Total Bilirubin: 0.4 mg/dL (ref 0.3–1.2)
Total Protein: 8.1 g/dL (ref 6.5–8.1)

## 2016-12-10 LAB — HCG, SERUM, QUALITATIVE: Preg, Serum: NEGATIVE

## 2016-12-10 LAB — CBC WITH DIFFERENTIAL/PLATELET
BASOS ABS: 0 10*3/uL (ref 0.0–0.1)
BASOS PCT: 0 %
EOS PCT: 3 %
Eosinophils Absolute: 0.3 10*3/uL (ref 0.0–0.7)
HCT: 35.6 % — ABNORMAL LOW (ref 36.0–46.0)
Hemoglobin: 11.4 g/dL — ABNORMAL LOW (ref 12.0–15.0)
Lymphocytes Relative: 36 %
Lymphs Abs: 3.9 10*3/uL (ref 0.7–4.0)
MCH: 27.1 pg (ref 26.0–34.0)
MCHC: 32 g/dL (ref 30.0–36.0)
MCV: 84.8 fL (ref 78.0–100.0)
MONO ABS: 0.6 10*3/uL (ref 0.1–1.0)
Monocytes Relative: 6 %
Neutro Abs: 5.8 10*3/uL (ref 1.7–7.7)
Neutrophils Relative %: 55 %
PLATELETS: 407 10*3/uL — AB (ref 150–400)
RBC: 4.2 MIL/uL (ref 3.87–5.11)
RDW: 14.7 % (ref 11.5–15.5)
WBC: 10.6 10*3/uL — AB (ref 4.0–10.5)

## 2016-12-11 ENCOUNTER — Other Ambulatory Visit (HOSPITAL_COMMUNITY): Payer: 59

## 2016-12-11 DIAGNOSIS — Z9884 Bariatric surgery status: Secondary | ICD-10-CM | POA: Diagnosis not present

## 2016-12-13 DIAGNOSIS — R269 Unspecified abnormalities of gait and mobility: Secondary | ICD-10-CM | POA: Diagnosis not present

## 2016-12-13 DIAGNOSIS — K922 Gastrointestinal hemorrhage, unspecified: Secondary | ICD-10-CM | POA: Diagnosis not present

## 2016-12-13 DIAGNOSIS — Z9884 Bariatric surgery status: Secondary | ICD-10-CM | POA: Diagnosis not present

## 2016-12-14 ENCOUNTER — Inpatient Hospital Stay (HOSPITAL_COMMUNITY): Payer: 59 | Admitting: Anesthesiology

## 2016-12-14 ENCOUNTER — Encounter (HOSPITAL_COMMUNITY)
Admission: RE | Disposition: A | Discharge status: Home / Self Care | Payer: Self-pay | Source: Ambulatory Visit | Attending: General Surgery

## 2016-12-14 ENCOUNTER — Encounter (HOSPITAL_COMMUNITY): Payer: Self-pay | Admitting: Anesthesiology

## 2016-12-14 ENCOUNTER — Inpatient Hospital Stay (HOSPITAL_COMMUNITY)
Admission: RE | Admit: 2016-12-14 | Discharge: 2016-12-17 | DRG: 328 | Disposition: A | Discharge status: Home / Self Care | Payer: 59 | Source: Ambulatory Visit | Attending: General Surgery | Admitting: General Surgery

## 2016-12-14 DIAGNOSIS — Z8249 Family history of ischemic heart disease and other diseases of the circulatory system: Secondary | ICD-10-CM | POA: Diagnosis not present

## 2016-12-14 DIAGNOSIS — Z8371 Family history of colonic polyps: Secondary | ICD-10-CM | POA: Diagnosis not present

## 2016-12-14 DIAGNOSIS — K633 Ulcer of intestine: Secondary | ICD-10-CM | POA: Diagnosis not present

## 2016-12-14 DIAGNOSIS — F419 Anxiety disorder, unspecified: Secondary | ICD-10-CM | POA: Diagnosis present

## 2016-12-14 DIAGNOSIS — Z6830 Body mass index (BMI) 30.0-30.9, adult: Secondary | ICD-10-CM | POA: Diagnosis not present

## 2016-12-14 DIAGNOSIS — Z888 Allergy status to other drugs, medicaments and biological substances status: Secondary | ICD-10-CM | POA: Diagnosis not present

## 2016-12-14 DIAGNOSIS — Z9884 Bariatric surgery status: Secondary | ICD-10-CM

## 2016-12-14 DIAGNOSIS — R1310 Dysphagia, unspecified: Secondary | ICD-10-CM | POA: Diagnosis not present

## 2016-12-14 DIAGNOSIS — K219 Gastro-esophageal reflux disease without esophagitis: Principal | ICD-10-CM | POA: Diagnosis present

## 2016-12-14 DIAGNOSIS — M810 Age-related osteoporosis without current pathological fracture: Secondary | ICD-10-CM | POA: Diagnosis present

## 2016-12-14 DIAGNOSIS — Z880 Allergy status to penicillin: Secondary | ICD-10-CM | POA: Diagnosis not present

## 2016-12-14 DIAGNOSIS — Z818 Family history of other mental and behavioral disorders: Secondary | ICD-10-CM | POA: Diagnosis not present

## 2016-12-14 DIAGNOSIS — Z934 Other artificial openings of gastrointestinal tract status: Secondary | ICD-10-CM | POA: Diagnosis not present

## 2016-12-14 DIAGNOSIS — Z79899 Other long term (current) drug therapy: Secondary | ICD-10-CM | POA: Diagnosis not present

## 2016-12-14 DIAGNOSIS — Z881 Allergy status to other antibiotic agents status: Secondary | ICD-10-CM | POA: Diagnosis not present

## 2016-12-14 DIAGNOSIS — R1110 Vomiting, unspecified: Secondary | ICD-10-CM | POA: Diagnosis not present

## 2016-12-14 DIAGNOSIS — Z975 Presence of (intrauterine) contraceptive device: Secondary | ICD-10-CM

## 2016-12-14 DIAGNOSIS — K3189 Other diseases of stomach and duodenum: Secondary | ICD-10-CM | POA: Diagnosis not present

## 2016-12-14 DIAGNOSIS — F17200 Nicotine dependence, unspecified, uncomplicated: Secondary | ICD-10-CM | POA: Diagnosis present

## 2016-12-14 DIAGNOSIS — K9420 Gastrostomy complication, unspecified: Secondary | ICD-10-CM | POA: Diagnosis not present

## 2016-12-14 DIAGNOSIS — M549 Dorsalgia, unspecified: Secondary | ICD-10-CM | POA: Diagnosis present

## 2016-12-14 DIAGNOSIS — R112 Nausea with vomiting, unspecified: Secondary | ICD-10-CM | POA: Diagnosis present

## 2016-12-14 DIAGNOSIS — Z885 Allergy status to narcotic agent status: Secondary | ICD-10-CM

## 2016-12-14 HISTORY — PX: GASTRIC ROUX-EN-Y: SHX5262

## 2016-12-14 LAB — HEMOGLOBIN AND HEMATOCRIT, BLOOD
HEMATOCRIT: 34.1 % — AB (ref 36.0–46.0)
Hemoglobin: 11 g/dL — ABNORMAL LOW (ref 12.0–15.0)

## 2016-12-14 SURGERY — LAPAROSCOPIC ROUX-EN-Y GASTRIC
Anesthesia: General | Site: Abdomen

## 2016-12-14 MED ORDER — HYDROMORPHONE HCL 1 MG/ML IJ SOLN: 0.2500 mg | INTRAMUSCULAR | Status: DC | PRN

## 2016-12-14 MED ORDER — SUGAMMADEX SODIUM 200 MG/2ML IV SOLN
INTRAVENOUS | Status: DC | PRN
  Administered 2016-12-14: 200 mg via INTRAVENOUS

## 2016-12-14 MED ORDER — SCOPOLAMINE 1 MG/3DAYS TD PT72
MEDICATED_PATCH | TRANSDERMAL | Status: AC
  Filled 2016-12-14: qty 1

## 2016-12-14 MED ORDER — BUPIVACAINE LIPOSOME 1.3 % IJ SUSP
INTRAMUSCULAR | Status: DC | PRN
  Administered 2016-12-14: 20 mL

## 2016-12-14 MED ORDER — LIDOCAINE 2% (20 MG/ML) 5 ML SYRINGE
INTRAMUSCULAR | Status: AC
  Filled 2016-12-14: qty 5

## 2016-12-14 MED ORDER — APREPITANT 40 MG PO CAPS
40.0000 mg | ORAL_CAPSULE | ORAL | Status: AC
  Administered 2016-12-14: 40 mg via ORAL
  Filled 2016-12-14: qty 1

## 2016-12-14 MED ORDER — HEPARIN SODIUM (PORCINE) 5000 UNIT/ML IJ SOLN
5000.0000 [IU] | INTRAMUSCULAR | Status: AC
  Administered 2016-12-14: 5000 [IU] via SUBCUTANEOUS
  Filled 2016-12-14: qty 1

## 2016-12-14 MED ORDER — MIDAZOLAM HCL 2 MG/2ML IJ SOLN: 0.5000 mg | Freq: Once | INTRAMUSCULAR | Status: DC | PRN

## 2016-12-14 MED ORDER — BUPIVACAINE-EPINEPHRINE 0.25% -1:200000 IJ SOLN
INTRAMUSCULAR | Status: DC | PRN
  Administered 2016-12-14: 50 mL

## 2016-12-14 MED ORDER — CHLORHEXIDINE GLUCONATE CLOTH 2 % EX PADS: 6.0000 | MEDICATED_PAD | Freq: Once | CUTANEOUS | Status: DC

## 2016-12-14 MED ORDER — MIDAZOLAM HCL 5 MG/5ML IJ SOLN
INTRAMUSCULAR | Status: DC | PRN
  Administered 2016-12-14: 2 mg via INTRAVENOUS

## 2016-12-14 MED ORDER — LACTATED RINGERS IV SOLN
INTRAVENOUS | Status: DC
  Administered 2016-12-14 (×3): via INTRAVENOUS

## 2016-12-14 MED ORDER — ONDANSETRON HCL 4 MG/2ML IJ SOLN
INTRAMUSCULAR | Status: DC | PRN
  Administered 2016-12-14: 4 mg via INTRAVENOUS

## 2016-12-14 MED ORDER — PREMIER PROTEIN SHAKE
2.0000 [oz_av] | ORAL | Status: DC
  Administered 2016-12-16 – 2016-12-17 (×9): 2 [oz_av] via ORAL

## 2016-12-14 MED ORDER — ACETAMINOPHEN 325 MG PO TABS: 650.0000 mg | ORAL_TABLET | ORAL | Status: DC | PRN

## 2016-12-14 MED ORDER — PANTOPRAZOLE SODIUM 40 MG IV SOLR
40.0000 mg | Freq: Every day | INTRAVENOUS | Status: DC
  Administered 2016-12-14 – 2016-12-16 (×3): 40 mg via INTRAVENOUS
  Filled 2016-12-14 (×3): qty 40

## 2016-12-14 MED ORDER — BUPIVACAINE LIPOSOME 1.3 % IJ SUSP
20.0000 mL | Freq: Once | INTRAMUSCULAR | Status: DC
  Filled 2016-12-14: qty 20

## 2016-12-14 MED ORDER — ONDANSETRON HCL 4 MG/2ML IJ SOLN
INTRAMUSCULAR | Status: AC
  Filled 2016-12-14: qty 2

## 2016-12-14 MED ORDER — SUGAMMADEX SODIUM 200 MG/2ML IV SOLN
INTRAVENOUS | Status: AC
  Filled 2016-12-14: qty 2

## 2016-12-14 MED ORDER — SCOPOLAMINE 1 MG/3DAYS TD PT72
MEDICATED_PATCH | TRANSDERMAL | Status: DC | PRN
  Administered 2016-12-14: 1 via TRANSDERMAL

## 2016-12-14 MED ORDER — BUPIVACAINE-EPINEPHRINE 0.25% -1:200000 IJ SOLN
INTRAMUSCULAR | Status: AC
  Filled 2016-12-14: qty 1

## 2016-12-14 MED ORDER — FENTANYL CITRATE (PF) 100 MCG/2ML IJ SOLN
INTRAMUSCULAR | Status: DC | PRN
  Administered 2016-12-14: 50 ug via INTRAVENOUS
  Administered 2016-12-14: 100 ug via INTRAVENOUS
  Administered 2016-12-14 (×3): 50 ug via INTRAVENOUS

## 2016-12-14 MED ORDER — ALBUTEROL SULFATE (2.5 MG/3ML) 0.083% IN NEBU: 2.5000 mg | INHALATION_SOLUTION | Freq: Four times a day (QID) | RESPIRATORY_TRACT | Status: DC | PRN

## 2016-12-14 MED ORDER — PROPOFOL 10 MG/ML IV BOLUS
INTRAVENOUS | Status: DC | PRN
  Administered 2016-12-14: 180 mg via INTRAVENOUS

## 2016-12-14 MED ORDER — EVICEL 5 ML EX KIT
PACK | Freq: Once | CUTANEOUS | Status: AC
  Administered 2016-12-14: 5 mL
  Filled 2016-12-14: qty 1

## 2016-12-14 MED ORDER — LIP MEDEX EX OINT
TOPICAL_OINTMENT | CUTANEOUS | Status: AC
Start: 2016-12-14 — End: 2016-12-15
  Filled 2016-12-14: qty 7

## 2016-12-14 MED ORDER — ENOXAPARIN SODIUM 30 MG/0.3ML ~~LOC~~ SOLN
30.0000 mg | Freq: Two times a day (BID) | SUBCUTANEOUS | Status: DC
  Administered 2016-12-15 – 2016-12-17 (×4): 30 mg via SUBCUTANEOUS
  Filled 2016-12-14 (×3): qty 0.3

## 2016-12-14 MED ORDER — SODIUM CHLORIDE 0.9 % IJ SOLN
INTRAMUSCULAR | Status: AC
  Filled 2016-12-14: qty 50

## 2016-12-14 MED ORDER — ALBUTEROL SULFATE HFA 108 (90 BASE) MCG/ACT IN AERS: 2.0000 | INHALATION_SPRAY | Freq: Four times a day (QID) | RESPIRATORY_TRACT | Status: DC | PRN

## 2016-12-14 MED ORDER — DEXAMETHASONE SODIUM PHOSPHATE 10 MG/ML IJ SOLN
INTRAMUSCULAR | Status: DC | PRN
  Administered 2016-12-14: 8 mg via INTRAVENOUS

## 2016-12-14 MED ORDER — ONDANSETRON HCL 4 MG/2ML IJ SOLN
4.0000 mg | INTRAMUSCULAR | Status: DC | PRN
  Administered 2016-12-14 – 2016-12-15 (×3): 4 mg via INTRAVENOUS
  Filled 2016-12-14 (×3): qty 2

## 2016-12-14 MED ORDER — HYDROMORPHONE HCL 1 MG/ML IJ SOLN
1.0000 mg | INTRAMUSCULAR | Status: DC | PRN
  Administered 2016-12-14 – 2016-12-16 (×5): 1 mg via INTRAVENOUS
  Filled 2016-12-14 (×5): qty 1

## 2016-12-14 MED ORDER — PROMETHAZINE HCL 25 MG/ML IJ SOLN: 6.2500 mg | INTRAMUSCULAR | Status: DC | PRN

## 2016-12-14 MED ORDER — SUCCINYLCHOLINE CHLORIDE 200 MG/10ML IV SOSY
PREFILLED_SYRINGE | INTRAVENOUS | Status: DC | PRN
  Administered 2016-12-14: 140 mg via INTRAVENOUS

## 2016-12-14 MED ORDER — ROCURONIUM BROMIDE 50 MG/5ML IV SOSY
PREFILLED_SYRINGE | INTRAVENOUS | Status: AC
  Filled 2016-12-14: qty 10

## 2016-12-14 MED ORDER — MIDAZOLAM HCL 2 MG/2ML IJ SOLN
INTRAMUSCULAR | Status: AC
  Filled 2016-12-14: qty 2

## 2016-12-14 MED ORDER — FENTANYL CITRATE (PF) 250 MCG/5ML IJ SOLN
INTRAMUSCULAR | Status: AC
  Filled 2016-12-14: qty 5

## 2016-12-14 MED ORDER — LACTATED RINGERS IR SOLN
Status: DC | PRN
  Administered 2016-12-14: 1000 mL

## 2016-12-14 MED ORDER — SUCCINYLCHOLINE CHLORIDE 200 MG/10ML IV SOSY
PREFILLED_SYRINGE | INTRAVENOUS | Status: AC
  Filled 2016-12-14: qty 10

## 2016-12-14 MED ORDER — SODIUM CHLORIDE 0.9 % IV SOLN
1.0000 g | Freq: Once | INTRAVENOUS | Status: AC
  Administered 2016-12-14: 1 g via INTRAVENOUS
  Filled 2016-12-14: qty 1

## 2016-12-14 MED ORDER — FENTANYL CITRATE (PF) 100 MCG/2ML IJ SOLN
INTRAMUSCULAR | Status: AC
  Filled 2016-12-14: qty 2

## 2016-12-14 MED ORDER — ROCURONIUM BROMIDE 10 MG/ML (PF) SYRINGE
PREFILLED_SYRINGE | INTRAVENOUS | Status: DC | PRN
  Administered 2016-12-14 (×4): 10 mg via INTRAVENOUS
  Administered 2016-12-14: 40 mg via INTRAVENOUS
  Administered 2016-12-14 (×2): 10 mg via INTRAVENOUS

## 2016-12-14 MED ORDER — DEXAMETHASONE SODIUM PHOSPHATE 10 MG/ML IJ SOLN
INTRAMUSCULAR | Status: AC
  Filled 2016-12-14: qty 1

## 2016-12-14 MED ORDER — SODIUM CHLORIDE 0.9 % IJ SOLN
INTRAMUSCULAR | Status: DC | PRN
  Administered 2016-12-14: 50 mL via INTRAVENOUS

## 2016-12-14 MED ORDER — PROPOFOL 10 MG/ML IV BOLUS
INTRAVENOUS | Status: AC
  Filled 2016-12-14: qty 20

## 2016-12-14 MED ORDER — MEPERIDINE HCL 50 MG/ML IJ SOLN: 6.2500 mg | INTRAMUSCULAR | Status: DC | PRN

## 2016-12-14 SURGICAL SUPPLY — 93 items
ADH SKN CLS APL DERMABOND .7 (GAUZE/BANDAGES/DRESSINGS) ×1
APPLICATOR COTTON TIP 6IN STRL (MISCELLANEOUS) ×3 IMPLANT
APPLIER CLIP ROT 10 11.4 M/L (STAPLE)
APPLIER CLIP ROT 13.4 12 LRG (CLIP)
APR CLP LRG 13.4X12 ROT 20 MLT (CLIP)
APR CLP MED LRG 11.4X10 (STAPLE)
BAG SPEC RTRVL LRG 6X4 10 (ENDOMECHANICALS) ×1
BLADE SURG SZ11 CARB STEEL (BLADE) ×2 IMPLANT
CABLE HIGH FREQUENCY MONO STRZ (ELECTRODE) ×2 IMPLANT
CHLORAPREP W/TINT 26ML (MISCELLANEOUS) ×2 IMPLANT
CLIP APPLIE ROT 10 11.4 M/L (STAPLE) IMPLANT
CLIP APPLIE ROT 13.4 12 LRG (CLIP) IMPLANT
CLIP SUT LAPRA TY ABSORB (SUTURE) ×4 IMPLANT
COVER SURGICAL LIGHT HANDLE (MISCELLANEOUS) ×1 IMPLANT
CUTTER FLEX LINEAR 45M (STAPLE) ×2 IMPLANT
DECANTER SPIKE VIAL GLASS SM (MISCELLANEOUS) ×3 IMPLANT
DERMABOND ADVANCED (GAUZE/BANDAGES/DRESSINGS) ×1
DERMABOND ADVANCED .7 DNX12 (GAUZE/BANDAGES/DRESSINGS) ×1 IMPLANT
DEVICE SUT QUICK LOAD TK 5 (STAPLE) IMPLANT
DEVICE SUT TI-KNOT TK 5X26 (MISCELLANEOUS) IMPLANT
DEVICE SUTURE ENDOST 10MM (ENDOMECHANICALS) ×1 IMPLANT
DISSECTOR BLUNT TIP ENDO 5MM (MISCELLANEOUS) IMPLANT
DRAIN PENROSE 18X1/4 LTX STRL (WOUND CARE) ×2 IMPLANT
DRSG TEGADERM 2-3/8X2-3/4 SM (GAUZE/BANDAGES/DRESSINGS) ×1 IMPLANT
ELECT REM PT RETURN 9FT ADLT (ELECTROSURGICAL) ×2
ELECTRODE REM PT RTRN 9FT ADLT (ELECTROSURGICAL) ×1 IMPLANT
GAUZE SPONGE 2X2 8PLY STRL LF (GAUZE/BANDAGES/DRESSINGS) IMPLANT
GAUZE SPONGE 4X4 12PLY STRL (GAUZE/BANDAGES/DRESSINGS) ×1 IMPLANT
GAUZE SPONGE 4X4 16PLY XRAY LF (GAUZE/BANDAGES/DRESSINGS) ×2 IMPLANT
GLOVE BIOGEL PI IND STRL 7.5 (GLOVE) ×1 IMPLANT
GLOVE BIOGEL PI INDICATOR 7.5 (GLOVE) ×1
GLOVE ECLIPSE 7.5 STRL STRAW (GLOVE) ×2 IMPLANT
GOWN STRL REUS W/TWL XL LVL3 (GOWN DISPOSABLE) ×6 IMPLANT
HEMOSTAT SURGICEL 4X8 (HEMOSTASIS) IMPLANT
HOVERMATT SINGLE USE (MISCELLANEOUS) ×2 IMPLANT
IRRIG SUCT STRYKERFLOW 2 WTIP (MISCELLANEOUS)
IRRIGATION SUCT STRKRFLW 2 WTP (MISCELLANEOUS) IMPLANT
KIT BASIN OR (CUSTOM PROCEDURE TRAY) ×2 IMPLANT
KIT GASTRIC LAVAGE 34FR ADT (SET/KITS/TRAYS/PACK) ×2 IMPLANT
LUBRICANT JELLY K Y 4OZ (MISCELLANEOUS) ×1 IMPLANT
MARKER SKIN DUAL TIP RULER LAB (MISCELLANEOUS) ×2 IMPLANT
NEEDLE SPNL 22GX3.5 QUINCKE BK (NEEDLE) ×2 IMPLANT
PACK CARDIOVASCULAR III (CUSTOM PROCEDURE TRAY) ×2 IMPLANT
POUCH SPECIMEN RETRIEVAL 10MM (ENDOMECHANICALS) ×1 IMPLANT
RELOAD 45 VASCULAR/THIN (ENDOMECHANICALS) ×10 IMPLANT
RELOAD ENDO STITCH 2.0 (ENDOMECHANICALS) ×18
RELOAD STAPLE 45 2.5 WHT GRN (ENDOMECHANICALS) ×1 IMPLANT
RELOAD STAPLE 60 2.6 WHT THN (STAPLE) ×1 IMPLANT
RELOAD STAPLE 60 3.6 BLU REG (STAPLE) ×2 IMPLANT
RELOAD STAPLE 60 3.8 GOLD REG (STAPLE) ×1 IMPLANT
RELOAD STAPLE TA45 3.5 REG BLU (ENDOMECHANICALS) ×2 IMPLANT
RELOAD STAPLER BLUE 60MM (STAPLE) ×1 IMPLANT
RELOAD STAPLER GOLD 60MM (STAPLE) ×1 IMPLANT
RELOAD STAPLER WHITE 60MM (STAPLE) IMPLANT
RELOAD SUT SNGL STCH ABSRB 2-0 (ENDOMECHANICALS) IMPLANT
RELOAD SUT SNGL STCH BLK 2-0 (ENDOMECHANICALS) IMPLANT
SCISSORS LAP 5X45 EPIX DISP (ENDOMECHANICALS) ×2 IMPLANT
SET IRRIG TUBING LAPAROSCOPIC (IRRIGATION / IRRIGATOR) ×1 IMPLANT
SHEARS HARMONIC ACE PLUS 45CM (MISCELLANEOUS) ×2 IMPLANT
SLEEVE ADV FIXATION 12X100MM (TROCAR) ×2 IMPLANT
SOLUTION ANTI FOG 6CC (MISCELLANEOUS) ×2 IMPLANT
SPONGE GAUZE 2X2 STER 10/PKG (GAUZE/BANDAGES/DRESSINGS) ×1
STAPLER ECHELON LONG 60 440 (INSTRUMENTS) ×2 IMPLANT
STAPLER RELOAD BLUE 60MM (STAPLE) ×2
STAPLER RELOAD GOLD 60MM (STAPLE) ×2
STAPLER RELOAD WHITE 60MM (STAPLE)
SUT DEVICE BRAIDED 0X39 (SUTURE) IMPLANT
SUT DVC SILK 2.0X39 (SUTURE) IMPLANT
SUT DVC VICRYL PGA 2.0X39 (SUTURE) IMPLANT
SUT ETHILON 3 0 PS 1 (SUTURE) ×1 IMPLANT
SUT MNCRL AB 4-0 PS2 18 (SUTURE) ×2 IMPLANT
SUT RELOAD ENDO STITCH 2 48X1 (ENDOMECHANICALS) ×5
SUT RELOAD ENDO STITCH 2.0 (ENDOMECHANICALS) ×4
SUT SURGIDAC NAB ES-9 0 48 120 (SUTURE) IMPLANT
SUT VIC AB 2-0 SH 27 (SUTURE) ×2
SUT VIC AB 2-0 SH 27X BRD (SUTURE) IMPLANT
SUT VICRYL 0 UR6 27IN ABS (SUTURE) ×2 IMPLANT
SUTURE RELOAD END STTCH 2 48X1 (ENDOMECHANICALS) ×5 IMPLANT
SUTURE RELOAD ENDO STITCH 2.0 (ENDOMECHANICALS) ×4 IMPLANT
SYR 10ML ECCENTRIC (SYRINGE) ×2 IMPLANT
SYR 20CC LL (SYRINGE) ×4 IMPLANT
SYR 50ML LL SCALE MARK (SYRINGE) ×2 IMPLANT
TIP RIGID 35CM EVICEL (HEMOSTASIS) ×2 IMPLANT
TOWEL OR 17X26 10 PK STRL BLUE (TOWEL DISPOSABLE) ×2 IMPLANT
TOWEL OR NON WOVEN STRL DISP B (DISPOSABLE) ×2 IMPLANT
TRAY FOLEY W/METER SILVER 16FR (SET/KITS/TRAYS/PACK) ×2 IMPLANT
TROCAR ADV FIXATION 12X100MM (TROCAR) ×2 IMPLANT
TROCAR ADV FIXATION 5X100MM (TROCAR) ×2 IMPLANT
TROCAR BLADELESS OPT 5 100 (ENDOMECHANICALS) ×2 IMPLANT
TROCAR XCEL 12X100 BLDLESS (ENDOMECHANICALS) ×2 IMPLANT
TUBING CONNECTING 10 (TUBING) ×2 IMPLANT
TUBING ENDO SMARTCAP PENTAX (MISCELLANEOUS) ×2 IMPLANT
TUBING INSUF HEATED (TUBING) ×2 IMPLANT

## 2016-12-14 NOTE — Interval H&P Note (Signed)
History and Physical Interval Note:  12/14/2016 10:50 AM  [name]  has presented today for surgery, with the diagnosis of Morbid Obesity, GERD, Complications with Feeding Tube, Nausea/Vomiting  The various methods of treatment have been discussed with the patient and family. After consideration of risks, benefits and other options for treatment, the patient has consented to  Procedure(s): LAPAROSCOPIC REVISION SLEEVE GASTRECTOMY TO  ROUX-Y-GASTRIC BY-PASS, UPPER ENDO (N/A) as a surgical intervention .  The patient's history has been reviewed, patient examined, no change in status, stable for surgery.  I have reviewed the patient's chart and labs.  Questions were answered to the patient's satisfaction.     Aliena Ghrist T

## 2016-12-14 NOTE — Op Note (Signed)
[name] [id number] 04-Feb-1976 12/14/2016  Preoperative diagnosis: persistant nausea and vomiting after sleeve gastrectomy  Postoperative diagnosis: Same   Procedure: Upper endoscopy   Surgeon: Catalina Antigua B. Hassell Done  M.D., FACS   Anesthesia: Gen.   Indications for procedure: This patient was undergoing a conversion of sleeve gastrectomy to a roux en Y gastric bypass.    Description of procedure: The endoscopy was placed in the mouth and into the oropharynx and under endoscopic vision it was advanced to the esophagogastric junction.  The pouch was insufflated and no leaks for bleeding were noted.  Anastomosis was patent.   No bleeding or leaks were detected.  The scope was withdrawn without difficulty.     Matt B. Hassell Done, MD, FACS General, Bariatric, & Minimally Invasive Surgery [facility], [place]

## 2016-12-14 NOTE — Anesthesia Postprocedure Evaluation (Signed)
Anesthesia Post Note  Patient: JERAE IZARD  Procedure(s) Performed: Procedure(s) (LRB): LAPAROSCOPIC REVISION SLEEVE GASTRECTOMY TO  ROUX-Y-GASTRIC BY-PASS, UPPER ENDO (N/A)  Patient location during evaluation: PACU Anesthesia Type: General Level of consciousness: awake and alert, oriented and patient cooperative Pain management: pain level controlled Vital Signs Assessment: post-procedure vital signs reviewed and stable Respiratory status: spontaneous breathing, nonlabored ventilation, respiratory function stable and patient connected to nasal cannula oxygen Cardiovascular status: blood pressure returned to baseline and stable Postop Assessment: no signs of nausea or vomiting Anesthetic complications: no       Last Vitals:  Vitals:   12/14/16 1530 12/14/16 1545  BP: (!) 146/89 (!) 144/87  Pulse: 81 78  Resp: (!) 25 20  Temp:      Last Pain:  Vitals:   12/14/16 1545  TempSrc:   PainSc: 0-No pain                 Amal Renbarger,E. [place] Marney

## 2016-12-14 NOTE — Transfer of Care (Signed)
Immediate Anesthesia Transfer of Care Note  Patient: [name]  Procedure(s) Performed: Procedure(s): LAPAROSCOPIC REVISION SLEEVE GASTRECTOMY TO  ROUX-Y-GASTRIC BY-PASS, UPPER ENDO (N/A)  Patient Location: PACU  Anesthesia Type:General  Level of Consciousness: sedated  Airway & Oxygen Therapy: Patient Spontanous Breathing and Patient connected to face mask oxygen  Post-op Assessment: Report given to RN and Post -op Vital signs reviewed and stable  Post vital signs: Reviewed and stable  Last Vitals:  Vitals:   12/14/16 0907  BP: 117/89  Pulse: 76  Resp: 18  Temp: 37 C    Last Pain:  Vitals:   12/14/16 0907  TempSrc: Oral         Complications: No apparent anesthesia complications

## 2016-12-14 NOTE — Anesthesia Procedure Notes (Signed)
Procedure Name: Intubation Date/Time: 12/14/2016 11:19 AM Performed by: Noralyn Pick D Pre-anesthesia Checklist: Patient identified, Emergency Drugs available, Suction available and Patient being monitored Patient Re-evaluated:Patient Re-evaluated prior to inductionOxygen Delivery Method: Circle system utilized Preoxygenation: Pre-oxygenation with 100% oxygen Intubation Type: IV induction, Rapid sequence and Cricoid Pressure applied Laryngoscope Size: Mac and 4 Grade View: Grade I Tube type: Oral Tube size: 7.5 mm Number of attempts: 1 Airway Equipment and Method: Stylet and Oral airway Placement Confirmation: ETT inserted through vocal cords under direct vision,  positive ETCO2 and breath sounds checked- equal and bilateral Secured at: 21 cm Tube secured with: Tape Dental Injury: Teeth and Oropharynx as per pre-operative assessment

## 2016-12-14 NOTE — H&P (Signed)
History of Present Illness Marland Kitchen T. Laylonie Marzec MD; 12/11/2016 10:47 AM) The patient is a 41 year old female presenting status-post bariatric surgery. She returns to the office 3 weeks following laparoscopic sleeve gastrectomy. Her postoperative course was initially difficult for persistent vomiting and she stayed in the hospital for about 5 days. Workup including a Gastrografin swallow was negative. She was then discharged home but returned with persistent vomiting and was rehospitalized. CT scan was negative without obstruction or other abnormality. She improved after several days in the hospital and was discharged almost 1 week ago. However since being at home she's had some persistent nausea and difficulty with oral intake. Sometimes fluids will go okay but she can only take a small amount. She also sometimes take a small amount of solid protein-like tuner chicken which does okay and other times it does not. She has felt a little bit weak and lightheaded. She is trying to sip on water throughout the day. We are trying some vegan protein shakes to see if those work better.  Additional reasons for visit:  Bariatric surgery is described as the following: She returns for follow-up status post sleeve gastrectomy 01/14/16 with persistent intolerance of oral intake and negative studies but possible angulation of her sleeve. She has a feeding jejunostomy in place. She returns for preop visit prior to planned conversion to Roux-en-Y gastric bypass. Things up and stable. She continues on jejunostomy feedings. Has some intermittent pain and drainage around the jejunostomy tube which is unchanged. Continues to be intolerant of oral intake. We repeated her upper GI series which showed the barium tablet briefly hanging up in the esophagus. Again noted with some increased angulation of her sleeve without obvious obstruction. The patient does have some occasional dysphagia in her chest but she clearly  differentiates this from the nausea and vomiting she gets when she tries to eat. We have previously extensively discussed options and have followed her now for almost 1 year without any improvement. She appears to have some functional obstruction of her sleeve and therefore we plan to proceed with conversion to laparoscopic Roux-en-Y gastric bypass.   Problem List/Past Medical Marland Kitchen T. 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 Ricarda Atayde, MD; 12/11/2016 10:47 AM) Colon Polyp Removal - Colonoscopy  Foot Surgery  Bilateral. Hemorrhoidectomy  Knee Surgery  Left. Oral Surgery   Diagnostic Studies History Marland Kitchen T. Asif Muchow, MD; 12/11/2016 10:47 AM) Colonoscopy  1-5 years ago Pap Smear  1-5 years ago  Allergies [name], CMA; 12/11/2016 10:17 AM) MORPHINE  Doxycycline Hyclate *Tetracyclines**  Penicillin G Pot in Dextrose *PENICILLINS*  OxyCODONE HCl *ANALGESICS - OPIOID*  Ciprofloxacin HCl *Fluoroquinolones**  Zofran ODT *ANTIEMETICS*   Medication History [name], CMA; 12/11/2016 10:18 AM) Protonix (40MG Tablet DR, 1 (one) Oral two times daily, Taken starting 12/04/2016) Active. Ondansetron (8MG Tablet Disint, Oral) Active. B12-Active (1MG Tablet Chewable, Oral) Active. Vitamin B1 (100MG Tablet, Oral) Active. EPINEPHrine (0.3MG/0.3ML Device, Injection) Active. Nutritional Supplement (Oral) Active. (1000 mL into feeding tube) Centrum Adults (Oral) Active. Multivitamin (Oral) Active. Flonase (50MCG/ACT Suspension, Nasal) Active. Proventil HFA (108 (90 Base)MCG/ACT Aerosol Soln, Inhalation) Active. Medications Reconciled  Social History Marland Kitchen T. Swathi Dauphin, MD; 12/11/2016 10:47 AM) Caffeine use  Coffee, Tea. Tobacco use   Current some day smoker. No drug use  Alcohol use  Occasional alcohol use.  Family History Marland Kitchen T. Gwenn Teodoro, MD; 12/11/2016 10:47 AM) Heart disease in female family member before age 58  Heart Disease  Brother. Hypertension  Father. Heart disease in female family member before age 8  Arthritis  Father, Mother. Colon Cancer  Family Members In General. Colon Polyps  Father. Diabetes Mellitus  Family Members In General.  Pregnancy / Birth History Marland Kitchen T. Abdurrahman Petersheim, MD; 12/11/2016 10:47 AM) Contraceptive History  Contraceptive implant, Depo-provera, Intrauterine device. Gravida  5 Para  4 Age at menarche  71 years. Regular periods   Other Problems Marland Kitchen T. Yacoub Diltz, MD; 12/11/2016 10:47 AM) Anxiety Disorder  Gastroesophageal Reflux Disease  Back Pain  Migraine Headache  Arthritis  Hemorrhoids  Depression  Ulcerative Colitis  Transfusion history   Vitals [name] CMA; 12/11/2016 10:18 AM) 12/11/2016 10:18 AM Weight: 205.2 lb Height: 67in Body Surface Area: 2.04 m Body Mass Index: 32.14 kg/m  Temp.: 98.16F  Pulse: 71 (Regular)  BP: 110/70 (Sitting, Left Arm, Standard)       Physical Exam Marland Kitchen T. Rodolphe Edmonston MD; 12/11/2016 10:48 AM) The physical exam findings are as follows: Note:General: Alert, mildly overweight African American female, appears well,, in no distress Skin: Warm and dry without rash or infection. HEENT: No palpable masses or thyromegaly. Sclera nonicteric. Pupils equal round and reactive. Lymph nodes: No cervical, supraclavicular, or inguinal nodes palpable. Lungs: Breath sounds clear and equal. No wheezing or increased work of breathing. Cardiovascular: Regular rate and rhythm without murmer. No JVD or edema. Peripheral pulses intact. No carotid bruits. Abdomen: Nondistended. Soft and nontender. No masses palpable. No organomegaly. Jejunostomy and left mid abdomen. Extremities: No edema or joint  swelling or deformity. No chronic venous stasis changes. Neurologic: Alert and fully oriented. Gait normal. No focal weakness. Psychiatric: Normal mood and affect. Thought content appropriate with normal judgement and insight    Assessment & Plan Marland Kitchen T. 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 Unfortunately with prolonged observation this has failed to improve. If anything her reflux is worse. As we previously discussed we will need to proceed with planning for conversion to Roux-en-Y gastric bypass. I discussed the procedure in detail with the patient today. We discussed the nature of surgery. I discussed risks of anesthetic complications, bleeding, infection, ulceration, malnutrition, internal hernia. She was given a complete consent form to review. No major abnormalities and preoperative lab work and upper GI series as described above. We again discussed the procedure and reviewed complications and all questions answered.

## 2016-12-14 NOTE — Anesthesia Preprocedure Evaluation (Addendum)
Anesthesia Evaluation  Patient identified by MRN, date of birth, ID band Patient awake    Reviewed: Allergy & Precautions, NPO status , Patient's Chart, lab work & pertinent test results  History of Anesthesia Complications (+) PONV  Airway Mallampati: I  TM Distance: >3 FB Neck ROM: Full    Dental  (+) Missing, Dental Advisory Given   Pulmonary COPD,  COPD inhaler, former smoker,    breath sounds clear to auscultation       Cardiovascular negative cardio ROS   Rhythm:Regular Rate:Normal     Neuro/Psych  Headaches, Anxiety Depression    GI/Hepatic Neg liver ROS, GERD  Medicated and Poorly Controlled,Ulcerative colitis   Endo/Other  Morbid obesity  Renal/GU negative Renal ROS     Musculoskeletal  (+) Arthritis ,   Abdominal (+) + obese,   Peds  Hematology negative hematology ROS (+)   Anesthesia Other Findings   Reproductive/Obstetrics                            Anesthesia Physical Anesthesia Plan  ASA: II  Anesthesia Plan: General   Post-op Pain Management:    Induction: Intravenous  Airway Management Planned: Oral ETT  Additional Equipment:   Intra-op Plan:   Post-operative Plan: Extubation in OR  Informed Consent: I have reviewed the patients History and Physical, chart, labs and discussed the procedure including the risks, benefits and alternatives for the proposed anesthesia with the patient or authorized representative who has indicated his/her understanding and acceptance.   Dental advisory given  Plan Discussed with: CRNA and Surgeon  Anesthesia Plan Comments: (Plan routine monitors, GETA)        Anesthesia Quick Evaluation

## 2016-12-14 NOTE — Op Note (Signed)
Preoperative Diagnosis: Morbid Obesity, GERD, Nausea-Vomiting status post sleeve gastrectomy  Postoprative Diagnosis: Same Procedure: Procedure(s): LAPAROSCOPIC REVISION SLEEVE GASTRECTOMY TO  ROUX-Y-GASTRIC BY-PASS, takedown of feeding jejunostomy, UPPER ENDO   Surgeon: Excell Seltzer T   Assistants: Johnathan Hausen  Anesthesia:  General endotracheal anesthesia  Indications: Patient is a 41 year old female 11 months status post laparoscopic sleeve gastrectomy for morbid obesity. She developed nausea and vomiting soon after her surgery which unfortunately has been a persistent problem. Endoscopy and GI studies show no obstruction of her sleeve but possibly some increased angulation near the incisura. She has been unable to tolerate significant amounts of oral intake and has had a feeding jejunostomy placed for nutrition. Due to minimal findings on workup we have tried to avoid revisional surgery but she has no improvement over months of observation and has actually increasing reflux and heartburn. These findings after extensive preoperative workup and discussion detailed elsewhere with electric proceed with revision of her sleeve gastrectomy to Roux-en-Y gastric bypass and takedown of her feeding jejunostomy.    Procedure Detail:  Patient was brought to the operating room, placed in the supine position on the operating table, and general endotracheal anesthesia induced. She received preoperative IV antibiotics. Foley catheter was placed. PAS replaced. She had received subcutaneous heparin. The jejunostomy tube was removed. The abdomen was widely sterilely prepped and draped in the jejunostomy site covered with Tegaderm. Patient timeout was performed and procedure verified. Access was obtained with a 5 mm Optiview trocar in the right upper quadrant. Laparoscopy showed some carbon dioxide in the mesentery of the transverse colon and a small puncture or tear in the transverse colon mesentery. This was  well away from the colon and on careful examination there was no active bleeding or any evidence of injury to the colon or duodenum. Under direct vision a 5 mm trocar was placed to the left of the umbilicus for a camera port and a 12 mm trocar placed in the right upper abdomen at her previous trocar site. Initially the jejunostomy was taken down. Using sharp dissection and some Harmonic dissection the jejunum was dissected off the anterior abdominal wall. At this point additional trochars were placed, a 12 mm at the jejunostomy site and a 5 mm laterally in the left upper quadrant.  A bilateral T AP block was performed with Exparel diluted with Marcaine. I resected approximately 10 cm of jejunum at the jejunostomy site taking this back to normal healthy appearing bowel on either side and dividing with the GIA 45 mm white load stapler. The mesentery of the segment was taken with the harmonic scalpel. Short segment of bowel was placed in the Endo Catch bag and left in the abdomen for later retrieval. The distal limb was marked with a Penrose drain and we confirmed the other and traced back to the ligament of Treitz as the biliopancreatic limb. A 100 cm Roux limb was measured and at this point a side to side anastomosis was created near the end of the biliopancreatic limb to the side of the Roux limb with a single firing of the 45 mm GIA white load stapler. The common enterotomy was closed with running 2-0 Vicryl beginning of the rent of the enterotomy and tied centrally. The anastomosis appeared under no tension with good blood supply and widely patent. The mesenteric defect was closed with running 2-0 silk. The patient was then placed in reverse Trendelenburg into a 5 mm subxiphoid site the [facility] retractor was placed to the left lobe  of the liver elevated. There were minimal adhesions and no adhesions to the underside of the liver. There appeared to be some possible mild narrowing near the incisura but not any  obvious obstruction in the stomach. The more proximal sleeve did appear relatively slightly more dilated than the mid body of the sleeve. A point for creation of the pouch was measured about 5 cm from the EG junction which was well above the possible area of narrowing and across the slightly wider area of the proximal stomach. The lesser curve was dissected with Harmonic scalpel until the lesser sac was entered and this was free over to the greater curve of the stomach after taking down a few omental adhesions. The stomach was divided this point essentially transversely with 2 firings of the echelon 60 mm green load stapler with the second firing covering just about a centimeter. The Roux limb was then brought up in an antecolic fashion with the candycane facing to the left under no tension. The gastrojejunostomy was created with initial posterior row of running 2-0 Vicryl beginning meters beyond the staple line along the greater curve and extending down along the staple line of the pouch. The wall tube was passed easily to the end of the pouch and enterotomies created for the stapler and a fairly generous anastomosis was created with the GIA 45 mm linear stapler. The staple line was intact and without bleeding. The common enterotomy was then closed from either end with running 2-0 Vicryl. The Ewald tube was passed down through the anastomosis and then an outer layer of running 2-0 Vicryl was placed anteriorly. The Ewald tube was removed. The Terance Hart defect was exposed and closed with 2-0 silk. Dr. Hassell Done then went above and performed upper endoscopy and under saline irrigation and tensely distended and there was no evidence of leak. The endoscope passed easily through the anastomosis into the Roux limb. The pouch was then desufflated. Staple lines were coated with Evicel. We then again carefully examined the abdomen and there was no evidence of bleeding or other problems. Again carefully examined the transverse  colon mesentery and there was no evidence of bleeding or hematoma and again this appeared well away from the bowel in terms of bowel injury. The Endo Catch containing the short segment of small bowel was brought out through the 12 mm site at the previous feeding jejunostomy site after dilating the slightly. The skin and tract of this site was sharply debrided back to healthy tissue. The fascia was closed with 2 interrupted 0 Vicryl sutures. The skin at the site was closed with 2 3-0 nylon sutures. The Nathanson retractor was removed under direct vision and all CO2 evacuated and trochars removed. Skin incisions were closed with subcuticular Monocryl and Dermabond. Sponge needle and instrument counts were correct.   Findings: As above  Estimated Blood Loss:  less than 50 mL         Drains: None  Blood Given: none          Specimens: Portion of jejunum from jejunostomy site        Complications:  * No complications entered in OR log *         Disposition: PACU - hemodynamically stable.         Condition: stable

## 2016-12-15 ENCOUNTER — Inpatient Hospital Stay (HOSPITAL_COMMUNITY): Payer: 59

## 2016-12-15 LAB — CBC WITH DIFFERENTIAL/PLATELET
BASOS PCT: 0 %
Basophils Absolute: 0 10*3/uL (ref 0.0–0.1)
EOS ABS: 0 10*3/uL (ref 0.0–0.7)
Eosinophils Relative: 0 %
HCT: 32.5 % — ABNORMAL LOW (ref 36.0–46.0)
HEMOGLOBIN: 10.2 g/dL — AB (ref 12.0–15.0)
Lymphocytes Relative: 14 %
Lymphs Abs: 1.8 10*3/uL (ref 0.7–4.0)
MCH: 26.5 pg (ref 26.0–34.0)
MCHC: 31.4 g/dL (ref 30.0–36.0)
MCV: 84.4 fL (ref 78.0–100.0)
Monocytes Absolute: 0.9 10*3/uL (ref 0.1–1.0)
Monocytes Relative: 7 %
NEUTROS PCT: 79 %
Neutro Abs: 10.6 10*3/uL — ABNORMAL HIGH (ref 1.7–7.7)
Platelets: 356 10*3/uL (ref 150–400)
RBC: 3.85 MIL/uL — AB (ref 3.87–5.11)
RDW: 14.3 % (ref 11.5–15.5)
WBC: 13.4 10*3/uL — AB (ref 4.0–10.5)

## 2016-12-15 MED ORDER — POTASSIUM CHLORIDE IN NACL 20-0.9 MEQ/L-% IV SOLN
INTRAVENOUS | Status: DC
  Administered 2016-12-15 – 2016-12-17 (×3): via INTRAVENOUS
  Filled 2016-12-15 (×4): qty 1000

## 2016-12-15 MED ORDER — IOPAMIDOL (ISOVUE-300) INJECTION 61%
INTRAVENOUS | Status: AC
  Filled 2016-12-15: qty 50

## 2016-12-15 NOTE — Progress Notes (Signed)
Patient alert and oriented, Post op day 1.  Provided support and encouragement.  Encouraged pulmonary toilet, ambulation and small sips of liquids.  All questions answered.  Will continue to monitor. 

## 2016-12-15 NOTE — Progress Notes (Signed)
Patient in bed resting.  Patient encouraged to ambulate in hallway and in room as much as possible.  States that abdominal pain in 7/10. Patient states that she will call NT when she needs to ambulate.

## 2016-12-15 NOTE — Progress Notes (Signed)
Pt attempted to walk 360 feet but only made it about 180 until she had to sit down because she felt nauseated and also had epigastric pain. Assisted back to room with a wheelchair and given Dilaudid 43m and Zofran 433mIV. Will continue to monitor.

## 2016-12-15 NOTE — Discharge Instructions (Signed)
GASTRIC BYPASS/SLEEVE  Home Care Instructions   These instructions are to help you care for yourself when you go home.  Call: If you have any problems.  Call [phone] and ask for the surgeon on call  If you need immediate assistance come to the ER at [facility]. Tell the ER staff you are a new post-op gastric bypass or gastric sleeve patient  Signs and symptoms to report:  Severe  vomiting or nausea o If you cannot handle clear liquids for longer than 1 day, call your surgeon  Abdominal pain which does not get better after taking your pain medication  Fever greater than 100.4  F and chills  Heart rate over 100 beats a minute  Trouble breathing  Chest pain  Redness,  swelling, drainage, or foul odor at incision (surgical) sites  If your incisions open or pull apart  Swelling or pain in calf (lower leg)  Diarrhea (Loose bowel movements that happen often), frequent watery, uncontrolled bowel movements  Constipation, (no bowel movements for 3 days) if this happens: o Take Milk of Magnesia, 2 tablespoons by mouth, 3 times a day for 2 days if needed o Stop taking Milk of Magnesia once you have had a bowel movement o Call your doctor if constipation continues Or o Take Miralax  (instead of Milk of Magnesia) following the label instructions o Stop taking Miralax once you have had a bowel movement o Call your doctor if constipation continues  Anything you think is abnormal for you   Normal side effects after surgery:  Unable to sleep at night or unable to concentrate  Irritability  Being tearful (crying) or depressed  These are common complaints, possibly related to your anesthesia, stress of surgery, and change in lifestyle, that usually go away a few weeks after surgery. If these feelings continue, call your medical doctor.  Wound Care: You may have surgical glue, steri-strips, or staples over your incisions after surgery  Surgical glue: Looks like clear  film over your incisions and will wear off a little at a time  Steri-strips: Adhesive strips of tape over your incisions. You may notice a yellowish color on skin under the steri-strips. This is used to make the steri-strips stick better. Do not pull the steri-strips off - let them fall off  Staples: Staples may be removed before you leave the hospital o If you go home with staples, call [facility] for an appointment with your surgeons nurse to have staples removed 10 days after surgery, [phone]  Showering: You may shower two (2) days after your surgery unless your surgeon tells you differently o Wash gently around incisions with warm soapy water, rinse well, and gently pat dry o If you have a drain (tube from your incision), you may need someone to hold this while you shower o No tub baths until staples are removed and incisions are healed   Medications:  Medications should be liquid or crushed if larger than the size of a dime  Extended release pills (medication that releases a little bit at a time through the  day) should not be crushed  Depending on the size and number of medications you take, you may need to space (take a few throughout the day)/change the time you take your medications so that you do not over-fill your pouch (smaller stomach)  Make sure you follow-up with you primary care physician to make medication changes needed during rapid weight loss and life -style changes  If you have diabetes, follow up with your doctor that orders your diabetes medication(s) within one week after surgery and check your blood sugar regularly   Do not drive while taking narcotics (pain medications)   Do not take acetaminophen (Tylenol) and Roxicet or Lortab Elixir at the same time since these pain medications contain acetaminophen   Diet:  First 2 Weeks You will see the nutritionist about two (2) weeks after your surgery. The nutritionist will increase the types of  foods you can eat if you are handling liquids well:  If you have severe vomiting or nausea and cannot handle clear liquids lasting longer than 1 day call your surgeon Protein Shake  Drink at least 2 ounces of shake 5-6 times per day  Each serving of protein shakes (usually 8-12 ounces) should have a minimum of: o 15 grams of protein o And no more than 5 grams of carbohydrate  Goal for protein each day: o Men = 80 grams per day o Women = 60 grams per day     Protein powder may be added to fluids such as non-fat milk or Lactaid milk or Soy milk (limit to 35 grams added protein powder per serving)  Hydration  Slowly increase the amount of water and other clear liquids as tolerated (See Acceptable Fluids)  Slowly increase the amount of protein shake as tolerated  Sip fluids slowly and throughout the day  May use sugar substitutes in small amounts (no more than 6-8 packets per day; i.e. Splenda)  Fluid Goal  The first goal is to drink at least 8 ounces of protein shake/drink per day (or as directed by the nutritionist); some examples of protein shakes are [employer], [employer], EAS Edge HP, and Unjury. - See handout from pre-op Bariatric Education Class: o Slowly increase the amount of protein shake you drink as tolerated o You may find it easier to slowly sip shakes throughout the day o It is important to get your proteins in first  Your fluid goal is to drink 64-100 ounces of fluid daily o It may take a few weeks to build up to this   32 oz. (or more) should be clear liquids And  32 oz. (or more) should be full liquids (see below for examples)  Liquids should not contain sugar, caffeine, or carbonation  Clear Liquids:  Water of Sugar-free flavored water (i.e. Fruit HO, Propel)  Decaffeinated coffee or tea (sugar-free)  Crystal lite, Wylers Lite, Minute Maid Lite  Sugar-free Jell-O  Bouillon or broth  Sugar-free Popsicle:    - Less than 20 calories  each; Limit 1 per day  Full Liquids:                   Protein Shakes/Drinks + 2 choices per day of other full liquids  Full liquids must be: o No More Than 12 grams of Carbs per serving o No More Than 3 grams of Fat per serving  Strained low-fat cream soup  Non-Fat milk  Fat-free Lactaid Milk  Sugar-free yogurt (Dannon Lite & Fit, Greek yogurt)    Vitamins and Minerals  Start 1 day after surgery unless otherwise directed by your surgeon  2 Chewable Multivitamin / Multimineral Supplement with iron (ie.Bariadvantage)  Vitamin B-12, 350-500 micrograms sub-lingual (place tablet under the tongue) each day  Chewable Calcium Citrate with Vitamin D-3 (Example: 3 Chewable Calcium  Plus 600 with Vitamin D-3) o Take 500 mg three (3) times a day for a total of  1500 mg each day o Do not take all 3 doses of calcium at one time as it may cause constipation, and you can only absorb 500 mg at a time o Do not mix multivitamins containing iron with calcium supplements;  take 2 hours apart  Menstruating women and those at risk for anemia ( a blood disease that causes weakness) may need extra iron o Talk to your doctor to see if you need more iron  If you need extra iron: Total daily Iron recommendation (including Vitamins) is 50 to 100 mg Iron/day  Do not stop taking or change any vitamins or minerals until you talk to your nutritionist or surgeon  Your nutritionist and/or surgeon must approve all vitamin and mineral supplements   Activity and Exercise: It is important to continue walking at home. Limit your physical activity as instructed by your doctor. During this time, use these guidelines:  Do not lift anything greater than ten  (10) pounds for at least two (2) weeks  Do not go back to work or drive until Engineer, production says you can  You may have sex when you feel comfortable o It is VERY important for female patients to use a reliable birth control method; fertility often increase  after surgery o Do not get pregnant for at least 18 months  Start exercising as soon as your doctor tells you that you can o Make sure your doctor approves any physical activity  Start with a simple walking program  Walk 5-15 minutes each day, 7 days per week  Slowly increase until you are walking 30-45 minutes per day  Consider joining our [place] program. [phone] or email [email or web address]   Special Instructions Things to remember:  Free counseling is available for you and your family through collaboration between [facility] and [place]. Please call [phone] and leave a message  Use your CPAP when sleeping if this applies to you  Consider buying a medical alert bracelet that says you had lap-band surgery     You will likely have your first fill (fluid added to your band) 6 - 8 weeks after surgery  [facility] has a free Bariatric Surgery Support Group that meets monthly, the 3rd Thursday, [place]. You can see classes online at [email or web address]  It is very important to keep all follow up appointments with your surgeon, nutritionist, primary care physician, and behavioral health practitioner o After the first year, please follow up with your bariatric surgeon and nutritionist at least once a year in order to maintain best weight loss results                    [facility]:  [place]: [phone]               Bariatric Nurse Coordinator: [phone]  Gastric Bypass/Sleeve Home Care Instructions  Rev. 10/2012                                                         Reviewed and Vilinda Boehringer  by [facility] Patient Education Committee, Jan, 2014

## 2016-12-15 NOTE — Progress Notes (Signed)
Initial Nutrition Assessment  INTERVENTION:   Diet advancement per MD Will provide diet education upon diet advancement  Recommend liquid Multivitamin with minerals daily with diet advancement  NUTRITION DIAGNOSIS:   Inadequate oral intake related to nausea, vomiting as evidenced by meal completion < 25%.  GOAL:   Patient will meet greater than or equal to 90% of their needs  MONITOR:   Diet advancement, Labs, Weight trends, I & O's  REASON FOR ASSESSMENT:   Consult Diet education  ASSESSMENT:   41 year old female presenting status-post bariatric surgery. She returns to the office 3 weeks following laparoscopic sleeve gastrectomy.  Her postoperative course was initially difficult for persistent vomiting and she stayed in the hospital for about 5 days.  Workup including a Gastrografin swallow was negative.  She was then discharged home but returned with persistent vomiting and was rehospitalized.  CT scan was negative without obstruction or other abnormality.  She improved after several days in the hospital and was discharged almost 1 week ago.  However since being at home she's had some persistent nausea and difficulty with oral intake.  Sometimes fluids will go okay but she can only take a small amount.  She also sometimes take a small amount of solid protein-like tuner chicken which does okay and other times it does not.  She has felt a little bit weak and lightheaded.  She is trying to sip on water throughout the day.   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 Pt's RN reports the plan with the patient is to advance diet slowly to ensure tolerance. Since last admission in August 2017, pt has been relying on J-tube feedings of Osmolite 1.2 as main nutrition source. Pt was never able to advance to goal rate and states the rate she was able to  get up to was 40-50 ml/hr (provides 1152-1440 kcal and 53-66g protein).   Pt states the only thing she could tolerate PO was a sugar-free vanilla ice coffee from [employer] (provides 130 kcal and 2g protein). States she would sip on this all day. In combination with her TF, she was receiving ~89% of kcal and 85% of protein estimated needs.   Pt is currently NPO, with only ice chips and water. Will follow for diet advancement and tolerance.  Since gastric sleeve surgery in April of 2017, pt has lost 103 lb.  Medications: IV Protonix daily, IV Zofran PRN Labs reviewed: Vitamin B-12 WNL  Diet Order:  Diet NPO time specified  Skin:  Wound (see comment) (3/19 abdominal incision)  Last BM:  PTA  Height:   Ht Readings from Last 1 Encounters:  12/14/16 5' 7.5" (1.715 m)    Weight:   Wt Readings from Last 1 Encounters:  12/14/16 199 lb 6 oz (90.4 kg)    Ideal Body Weight:  61.4 kg  BMI:  Body mass index is 30.77 kg/m.  Estimated Nutritional Needs:   Kcal:  0321-2248  Protein:  80-90g  Fluid:  2L/day  EDUCATION NEEDS:   Education needs no appropriate at this time  Clayton Bibles, MS, RD, LDN Pager: [phone] After Hours Pager: [phone]

## 2016-12-15 NOTE — Progress Notes (Signed)
Patient ID: STASHIA SIA, female   DOB: November 03, 1975, 41 y.o.   MRN: [id number]  1 Day Post-Op   Subjective: Doing "okay". Mild to moderate pain and no pain meds since last night. Some nausea without vomiting.  Objective: Vital signs in last 24 hours: Temp:  [97.6 F (36.4 C)-98.9 F (37.2 C)] 98.8 F (37.1 C) (03/20 1028) Pulse Rate:  [67-92] 75 (03/20 1028) Resp:  [15-29] 16 (03/20 1028) BP: (93-149)/(49-90) 104/49 (03/20 1028) SpO2:  [98 %-100 %] 98 % (03/20 1028)    Intake/Output from previous day: 03/19 0701 - 03/20 0700 In: 2050 [I.V.:2000; IV Piggyback:50] Out: 482 [Urine:825; Blood:50] Intake/Output this shift: Total I/O In: 0  Out: 400 [Urine:400]  General appearance: alert, cooperative and no distress GI: mild diffuse tenderness. Incision/Wound: no erythema or drainage.  Lab Results:   Recent Labs  12/14/16 1856 12/15/16 0533  WBC  --  13.4*  HGB 11.0* 10.2*  HCT 34.1* 32.5*  PLT  --  356   BMET No results for input(s): NA, K, CL, CO2, GLUCOSE, BUN, CREATININE, CALCIUM in the last 72 hours.   Studies/Results: Dg Duanne Limerick W/water Sol Cm  Result Date: 12/15/2016 CLINICAL DATA:  41 year old female 1 day post gastric sleeve conversion to Roux-en-Y. Subsequent encounter. EXAM: WATER SOLUBLE UPPER GI SERIES TECHNIQUE: Single-column upper GI series was performed using water soluble contrast. CONTRAST:  35 cc Omnipaque. COMPARISON:  11/26/2016 preoperative exam. FLUOROSCOPY TIME:  Fluoroscopy Time:  1 minutes and 6 seconds Radiation Exposure Index (if provided by the fluoroscopic device): 13 mGy FINDINGS: Scout view reveals surgical clips and staples left upper quadrant. Contrast enters gastric pouch with slow transit time through anastomosis with small bowel. Patient was nauseous at this point. Delayed imaging reveals contrast slowly traverses into the small bowel without leak identified. Free intraperitoneal air may be related to recent surgery. IMPRESSION: Narrowing  at the gastric pouch/small bowel anastomosis with slow transit time through this region. No leak identified. Patient became nauseous during exam and only 35 cc of contrast ingested. Free intraperitoneal air may be related to recent surgery. Electronically Signed   By: Genia Del M.D.   On: 12/15/2016 09:35    Anti-infectives: Anti-infectives    Start     Dose/Rate Route Frequency Ordered Stop   12/14/16 1100  ertapenem (INVANZ) 1 g in sodium chloride 0.9 % 50 mL IVPB     1 g 100 mL/hr over 30 Minutes Intravenous  Once 12/14/16 1049 12/14/16 1120      Assessment/Plan: s/p Procedure(s): LAPAROSCOPIC REVISION SLEEVE GASTRECTOMY TO  ROUX-Y-GASTRIC BY-PASS, UPPER ENDO Stable postoperatively. Slow gastric emptying and some apparent narrowing of the anastomosis on upper GI. No leak. Anastomosis appeared widely patent on intraoperative endoscopy and I expect this represents some edema. Collateralized chips and sips of water today. Ambulation encouraged.   LOS: 1 day    Maryum Batterson T 12/15/2016

## 2016-12-15 NOTE — Progress Notes (Signed)
UGI results text paged to Dr Excell Seltzer.  Orders received via [place] nurse.

## 2016-12-16 ENCOUNTER — Encounter (HOSPITAL_COMMUNITY): Payer: Self-pay | Admitting: General Surgery

## 2016-12-16 LAB — CBC WITH DIFFERENTIAL/PLATELET
BASOS ABS: 0 10*3/uL (ref 0.0–0.1)
BASOS PCT: 0 %
Eosinophils Absolute: 0.4 10*3/uL (ref 0.0–0.7)
Eosinophils Relative: 4 %
HEMATOCRIT: 28.4 % — AB (ref 36.0–46.0)
Hemoglobin: 9 g/dL — ABNORMAL LOW (ref 12.0–15.0)
Lymphocytes Relative: 24 %
Lymphs Abs: 2.2 10*3/uL (ref 0.7–4.0)
MCH: 27.2 pg (ref 26.0–34.0)
MCHC: 31.7 g/dL (ref 30.0–36.0)
MCV: 85.8 fL (ref 78.0–100.0)
Monocytes Absolute: 0.6 10*3/uL (ref 0.1–1.0)
Monocytes Relative: 7 %
NEUTROS ABS: 6 10*3/uL (ref 1.7–7.7)
Neutrophils Relative %: 65 %
PLATELETS: 260 10*3/uL (ref 150–400)
RBC: 3.31 MIL/uL — AB (ref 3.87–5.11)
RDW: 14.7 % (ref 11.5–15.5)
WBC: 9.2 10*3/uL (ref 4.0–10.5)

## 2016-12-16 LAB — BASIC METABOLIC PANEL
ANION GAP: 6 (ref 5–15)
BUN: 7 mg/dL (ref 6–20)
CALCIUM: 8.5 mg/dL — AB (ref 8.9–10.3)
CO2: 22 mmol/L (ref 22–32)
Chloride: 108 mmol/L (ref 101–111)
Creatinine, Ser: 0.79 mg/dL (ref 0.44–1.00)
GLUCOSE: 70 mg/dL (ref 65–99)
POTASSIUM: 4.1 mmol/L (ref 3.5–5.1)
Sodium: 136 mmol/L (ref 135–145)

## 2016-12-16 MED ORDER — HYDROCODONE-ACETAMINOPHEN 7.5-325 MG/15ML PO SOLN
10.0000 mL | ORAL | Status: DC | PRN
  Administered 2016-12-16 (×3): 10 mL via ORAL
  Filled 2016-12-16 (×3): qty 15

## 2016-12-16 MED ORDER — BISACODYL 10 MG RE SUPP
10.0000 mg | Freq: Every day | RECTAL | Status: DC | PRN
  Administered 2016-12-16: 10 mg via RECTAL
  Filled 2016-12-16: qty 1

## 2016-12-16 NOTE — Progress Notes (Signed)
Patient ID: [name], female   DOB: [birth year], 41 y.o.   MRN: [id number] 2 Days Post-Op   Subjective: Doing "okay" this morning. Still very sore but less than yesterday and no severe pain. She is starting her protein shakes and tolerating these without nausea. No bowel movement yet. Has been walking.  Objective: Vital signs in last 24 hours: Temp:  [98.1 F (36.7 C)-98.8 F (37.1 C)] 98.1 F (36.7 C) (03/21 0536) Pulse Rate:  [57-76] 66 (03/21 0536) Resp:  [15-16] 15 (03/21 0536) BP: (104-118)/(49-72) 109/64 (03/21 0536) SpO2:  [97 %-100 %] 100 % (03/21 0536) Last BM Date: 12/09/16  Intake/Output from previous day: 03/20 0701 - 03/21 0700 In: 1811.7 [I.V.:1811.7] Out: 1775 [Urine:1775] Intake/Output this shift: Total I/O In: 15 [P.O.:15] Out: -   General appearance: alert, cooperative and no distress GI: Mild appropriate tenderness Incision/Wound: Clean and dry  Lab Results:   Recent Labs  12/15/16 0533 12/16/16 0528  WBC 13.4* 9.2  HGB 10.2* 9.0*  HCT 32.5* 28.4*  PLT 356 260   BMET  Recent Labs  12/16/16 0528  NA 136  K 4.1  CL 108  CO2 22  GLUCOSE 70  BUN 7  CREATININE 0.79  CALCIUM 8.5*     Studies/Results: Dg Ugi W/water Sol Cm  Result Date: 12/15/2016 CLINICAL DATA:  41 year old female 1 day post gastric sleeve conversion to Roux-en-Y. Subsequent encounter. EXAM: WATER SOLUBLE UPPER GI SERIES TECHNIQUE: Single-column upper GI series was performed using water soluble contrast. CONTRAST:  35 cc Omnipaque. COMPARISON:  11/26/2016 preoperative exam. FLUOROSCOPY TIME:  Fluoroscopy Time:  1 minutes and 6 seconds Radiation Exposure Index (if provided by the fluoroscopic device): 13 mGy FINDINGS: Scout view reveals surgical clips and staples left upper quadrant. Contrast enters gastric pouch with slow transit time through anastomosis with small bowel. Patient was nauseous at this point. Delayed imaging reveals contrast slowly traverses into the small  bowel without leak identified. Free intraperitoneal air may be related to recent surgery. IMPRESSION: Narrowing at the gastric pouch/small bowel anastomosis with slow transit time through this region. No leak identified. Patient became nauseous during exam and only 35 cc of contrast ingested. Free intraperitoneal air may be related to recent surgery. Electronically Signed   By: Genia Del M.D.   On: 12/15/2016 09:35    Anti-infectives: Anti-infectives    Start     Dose/Rate Route Frequency Ordered Stop   12/14/16 1100  ertapenem (INVANZ) 1 g in sodium chloride 0.9 % 50 mL IVPB     1 g 100 mL/hr over 30 Minutes Intravenous  Once 12/14/16 1049 12/14/16 1120      Assessment/Plan: s/p Procedure(s): LAPAROSCOPIC REVISION SLEEVE GASTRECTOMY TO  ROUX-Y-GASTRIC BY-PASS, UPPER ENDO Generally doing well. Tolerating protein shakes at this point. Advance this as tolerated. Ambulation encouraged. Moderate drop in hemoglobin. Probable rehydration plus some blood loss anemia. Monitor. Doubt any active bleeding.  Repeat CBC in a.m. With her complicated situation and slight drop in hemoglobin we will monitor her in the hospital 1 more day.   LOS: 2 days    Nakhia Levitan T 12/16/2016

## 2016-12-16 NOTE — Progress Notes (Signed)
Patient alert and oriented, Post op day 2.  Provided support and encouragement.  Encouraged pulmonary toilet, ambulation and small sips of liquids.  All questions answered. Text paged hemoglobin of 9.0 to Dr Excell Seltzer. VSS.  Will continue to monitor.

## 2016-12-17 LAB — CBC
HCT: 28.2 % — ABNORMAL LOW (ref 36.0–46.0)
Hemoglobin: 9.1 g/dL — ABNORMAL LOW (ref 12.0–15.0)
MCH: 27.7 pg (ref 26.0–34.0)
MCHC: 32.3 g/dL (ref 30.0–36.0)
MCV: 85.7 fL (ref 78.0–100.0)
Platelets: 241 10*3/uL (ref 150–400)
RBC: 3.29 MIL/uL — ABNORMAL LOW (ref 3.87–5.11)
RDW: 14.7 % (ref 11.5–15.5)
WBC: 6.4 10*3/uL (ref 4.0–10.5)

## 2016-12-17 MED ORDER — FLUCONAZOLE 100 MG PO TABS: 100.0000 mg | ORAL_TABLET | Freq: Once | ORAL | 0 refills | Status: AC

## 2016-12-17 MED ORDER — HYDROCODONE-ACETAMINOPHEN 7.5-325 MG/15ML PO SOLN: 10.0000 mL | Freq: Four times a day (QID) | ORAL | 0 refills | Status: DC | PRN

## 2016-12-17 NOTE — Progress Notes (Signed)
Patient ID: [name], female   DOB: 02/17/1976, 41 y.o.   MRN: [id number]   3 Days Post-Op   Subjective: Doing pretty well. She is upset about issues her son is having at home. However tolerating protein shakes well. Drinking some water as well. Has been ambulatory. Sore but no increasing pain.  Objective: Vital signs in last 24 hours: Temp:  [98.1 F (36.7 C)-99.1 F (37.3 C)] 98.8 F (37.1 C) (03/22 0517) Pulse Rate:  [63-72] 64 (03/22 0517) Resp:  [17-20] 20 (03/22 0517) BP: (108-120)/(57-73) 110/67 (03/22 0517) SpO2:  [99 %-100 %] 100 % (03/22 0517) Last BM Date: 12/16/16  Intake/Output from previous day: 03/21 0701 - 03/22 0700 In: 1974.6 [P.O.:240; I.V.:1734.6] Out: 2450 [Urine:2450] Intake/Output this shift: No intake/output data recorded.  General appearance: alert, cooperative and no distress GI: normal findings: soft, non-tender Incision/Wound: No erythema or drainage  Lab Results:   Recent Labs  12/16/16 0528 12/17/16 0504  WBC 9.2 6.4  HGB 9.0* 9.1*  HCT 28.4* 28.2*  PLT 260 241   BMET  Recent Labs  12/16/16 0528  NA 136  K 4.1  CL 108  CO2 22  GLUCOSE 70  BUN 7  CREATININE 0.79  CALCIUM 8.5*     Studies/Results: Dg Ugi W/water Sol Cm  Result Date: 12/15/2016 CLINICAL DATA:  41 year old female 1 day post gastric sleeve conversion to Roux-en-Y. Subsequent encounter. EXAM: WATER SOLUBLE UPPER GI SERIES TECHNIQUE: Single-column upper GI series was performed using water soluble contrast. CONTRAST:  35 cc Omnipaque. COMPARISON:  11/26/2016 preoperative exam. FLUOROSCOPY TIME:  Fluoroscopy Time:  1 minutes and 6 seconds Radiation Exposure Index (if provided by the fluoroscopic device): 13 mGy FINDINGS: Scout view reveals surgical clips and staples left upper quadrant. Contrast enters gastric pouch with slow transit time through anastomosis with small bowel. Patient was nauseous at this point. Delayed imaging reveals contrast slowly traverses  into the small bowel without leak identified. Free intraperitoneal air may be related to recent surgery. IMPRESSION: Narrowing at the gastric pouch/small bowel anastomosis with slow transit time through this region. No leak identified. Patient became nauseous during exam and only 35 cc of contrast ingested. Free intraperitoneal air may be related to recent surgery. Electronically Signed   By: Genia Del M.D.   On: 12/15/2016 09:35    Anti-infectives: Anti-infectives    Start     Dose/Rate Route Frequency Ordered Stop   12/14/16 1100  ertapenem (INVANZ) 1 g in sodium chloride 0.9 % 50 mL IVPB     1 g 100 mL/hr over 30 Minutes Intravenous  Once 12/14/16 1049 12/14/16 1120      Assessment/Plan: s/p Procedure(s): LAPAROSCOPIC REVISION SLEEVE GASTRECTOMY TO  ROUX-Y-GASTRIC BY-PASS, UPPER ENDO Doing well postoperatively. Ready for discharge.   LOS: 3 days    Amarri Satterly T 12/17/2016

## 2016-12-17 NOTE — Progress Notes (Addendum)
Patient alert and oriented, pain is controlled. Patient is tolerating fluids, advanced to protein shake yesterday, patient is tolerating well. Reviewed Gastric Bypass discharge instructions with patient and patient is able to articulate understanding. Provided information on [facility]T program, Support Group and [facility] outpatient pharmacy.   Vitamin needs and schedule discussed with RD, Carmon Sails, for patient discharge.Questions answered.

## 2016-12-17 NOTE — Discharge Summary (Signed)
Patient ID: [name] [id number] 41 y.o. 01/31/1976  12/14/2016  Discharge date and time: 12/17/2016   Admitting Physician: Excell Seltzer T  Discharge Physician: Excell Seltzer T  Admission Diagnoses: Vomiting, GERD post-sleeve gastrectomy  Discharge Diagnoses: Same  Operations: Procedure(s): LAPAROSCOPIC REVISION SLEEVE GASTRECTOMY TO  ROUX-Y-GASTRIC BY-PASS, removal of feeding jejunostomy, UPPER ENDO  Admission Condition: fair  Discharged Condition: fair  Indication for Admission: Patient is a 41 year old female almost 1 year following laparoscopic sleeve gastrectomy for morbid obesity. She has had persistent intolerance of oral intake and worsening GERD. There is no anatomic obstruction we have been able to find but possibly some functional obstruction or slight kinking of her sleeve. She has had a feeding jejunostomy placed despite best efforts she is not able to tolerate oral intake and due to this and worsening GERD we have recommended conversion to Roux-en-Y gastric bypass. After extensive preoperative workup and discussion detailed elsewhere she is admitted following this procedure.  Hospital Course: On the morning of admission the patient underwent laparoscopic revision of her sleeve gastrectomy to Roux-en-Y gastric bypass with removal of her feeding jejunostomy tube. The procedure was unremarkable. Her postoperative course was smooth. We obtained a Gastrografin swallow on the first postoperative day that showed some slow emptying of her gastric pouch with possibly some edema at the anastomosis. She was started on sips of water and ice and was able to tolerate these well. With the above finding her diet was advanced slowly on the second postoperative day she began routine shakes and again tolerated these without nausea or vomiting. Her hemoglobin had a moderate drop from 11-9 but stabilized and white count was normal. She had moderate pain that improved. On the day of  discharge she is tolerating protein shakes and water without difficulty. She has had a bowel movement. Abdomen is soft and without significant tenderness and wounds are healing well. She is felt ready for discharge.   Disposition: Home  Patient Instructions:  Allergies as of 12/17/2016      Reactions   Oxycodone Anaphylaxis   Ciprofloxacin Itching, Rash   Coconut Flavor Swelling, Other (See Comments)   Itching in throat   Doxycycline Nausea And Vomiting   Morphine And Related Swelling, Other (See Comments)   Pt reported throat swelling to morphine.  Has tolerated Norco and Dilaudid.   Other    PATIENT STATES BREAKS OUT WITH BOTH PAPER TAPE AND PLASTIC TAPE SO WOULD PREFER PLASTIC TAPE SO NOT TO BE CHANGED TO MANY TIMES!   Penicillins Rash, Other (See Comments)   .Marland KitchenHas patient had a PCN reaction causing immediate rash, facial/tongue/throat swelling, SOB or lightheadedness with hypotension: Yes Has patient had a PCN reaction causing severe rash involving mucus membranes or skin necrosis: No Has patient had a PCN reaction that required hospitalization UNKNOWN - childhood allergy  Has patient had a PCN reaction occurring within the last 10 years: No If all of the above answers are "NO", then may proceed with Cephalosporin use.      Medication List    STOP taking these medications   feeding supplement (OSMOLITE 1.2 CAL) Liqd     TAKE these medications   albuterol 108 (90 Base) MCG/ACT inhaler Commonly known as:  PROVENTIL HFA;VENTOLIN HFA Inhale 2 puffs into the lungs every 6 (six) hours as needed for wheezing or shortness of breath. For shortness of breath   diclofenac sodium 1 % Gel Commonly known as:  VOLTAREN Apply 1 application topically 4 (four) times daily as needed (For knee  pain.). Reported on 04/01/2016   EPINEPHrine 0.3 mg/0.3 mL Soaj injection Commonly known as:  EPI-PEN Inject 0.3 mg into the muscle once as needed (For anaphylaxis.). Reported on 04/01/2016   fluticasone  50 MCG/ACT nasal spray Commonly known as:  FLONASE Place 1 spray into both nostrils daily as needed for allergies. Reported on 04/01/2016   HYDROcodone-acetaminophen 7.5-325 mg/15 ml solution Commonly known as:  HYCET Take 10 mLs by mouth every 6 (six) hours as needed for moderate pain.   multivitamin Liqd Place 15 mLs into feeding tube daily.   ondansetron 8 MG disintegrating tablet Commonly known as:  ZOFRAN-ODT Take 1 tablet (8 mg total) by mouth every 8 (eight) hours as needed for nausea.   pantoprazole 40 MG tablet Commonly known as:  PROTONIX Take 40 mg by mouth 2 (two) times daily. Reported on 04/01/2016   thiamine 100 MG tablet Commonly known as:  VITAMIN B-1 Place 100 mg into feeding tube daily. Crushed   VITAMIN B-12 SL Place 1 tablet under the tongue daily.       Activity: activity as tolerated Diet: Bariatric protein shakes Wound Care: none needed  Follow-up:  With Dr. Excell Seltzer in 1 week.  Signed: Edward Jolly MD, FACS  12/17/2016, 8:32 AM

## 2016-12-19 ENCOUNTER — Ambulatory Visit (INDEPENDENT_AMBULATORY_CARE_PROVIDER_SITE_OTHER): Payer: 59 | Admitting: Family Medicine

## 2016-12-19 VITALS — BP 132/76 | HR 106 | Temp 98.8°F | Resp 16 | Ht 66.5 in | Wt 197.0 lb

## 2016-12-19 DIAGNOSIS — N898 Other specified noninflammatory disorders of vagina: Secondary | ICD-10-CM

## 2016-12-19 DIAGNOSIS — E538 Deficiency of other specified B group vitamins: Secondary | ICD-10-CM | POA: Diagnosis not present

## 2016-12-19 LAB — POCT WET + KOH PREP
TRICH BY WET PREP: ABSENT
YEAST BY WET PREP: ABSENT

## 2016-12-19 MED ORDER — CYANOCOBALAMIN 1000 MCG/ML IJ SOLN
1000.0000 ug | INTRAMUSCULAR | Status: DC
  Administered 2016-12-19 – 2017-01-20 (×2): 1000 ug via INTRAMUSCULAR

## 2016-12-19 MED ORDER — FLUCONAZOLE 150 MG PO TABS: 150.0000 mg | ORAL_TABLET | Freq: Once | ORAL | 0 refills | Status: AC

## 2016-12-19 MED ORDER — TRAMADOL HCL 50 MG PO TABS: 50.0000 mg | ORAL_TABLET | Freq: Three times a day (TID) | ORAL | 0 refills | Status: DC | PRN

## 2016-12-19 NOTE — Patient Instructions (Signed)
It was good to meet you today.  Take the Diflucan 1 tablet today and again in 3 days to help with yeast.  The STD results will come back in the next 2 days.

## 2016-12-19 NOTE — Progress Notes (Signed)
[name] is a 41 y.o. female who presents to Primary Care at [facility] today for 2 issues:  1.  Vaginal discharge:  Present for the past 3-4 days. She was treated with 1 dose of Diflucan while in the hospital. States this did not really help.  She is concerned because she had increased sexual activity (with same partner) prior to surgery because she was worried she would be "too sore" afterwards.  Discharge started right after surgery.  With some burning and itching and labial swelling. Would like to be tested for STDs  2.  B12 injection:  Patient s/p gastric bypass.  Has been receiving B12 injections for this for months.  Recently with   uses seat laparoscopic revision of sleeve gastrectomy to Roux-en-Y gastric bypass on Monday of this week. Also with removal of feeding jejunostomy tube at that time. She's been doing well from pain standpoint for this mostly. She states she's been taking hydrocodone as prescribed from her surgeon but this makes her too sleepy and she likes and that does not make her sleepy and she has young children.  ROS as above:  PMH reviewed. Patient is a nonsmoker.   Past Medical History:  Diagnosis Date  . Anal fissure - posterior 10/16/2014  . Anxiety    doesn't take anything  . Asthma    has Albuterol inhaler as needed  . Bronchitis   . Chronic headache disorder 07/22/2016  . Clostridium difficile infection 04/20/2012  . Colitis   . Dehydration   . Depression    doesn't take any meds  . Family history of adverse reaction to anesthesia    pt mom gets sick  . GERD (gastroesophageal reflux disease)    takes Pantoprazole daily  . Headache   . Hiatal hernia   . History of blood transfusion    last transfusion was 04/04/2016=Benadryl was given d/t itching. States she always itches with transfusion.   Marland Kitchen History of bronchitis    > 2 yrs ago  . History of colon polyps    benign  . History of migraine    last one 05/01/16  . History of urinary tract infection    . IDA (iron deficiency anemia)   . Internal and external bleeding hemorrhoids 06/11/2014  . Joint pain   . Joint swelling   . Left sided chronic colitis - segmental 06/11/2014  . Migraine   . Motion sickness   . Nausea    takes Zofran as needed  . Obesity   . Oligouria   . Osteoarthritis    back  . Pneumonia    hx of-yrs ago  . Postoperative nausea and vomiting 01/21/2016  . TOBACCO USER 10/02/2009   Qualifier: Diagnosis of  By: Dimas Millin MD, Ellard Artis    . Transfusion history    last transfusion 6'16   . UC (ulcerative colitis) ([place])    supposed to be taking Lialda and Bentyl but has been off since gastric sleeve  . Ulcerative colitis ([place])   . Vertigo    doesn't take any meds   Past Surgical History:  Procedure Laterality Date  . COLONOSCOPY  2007   for rectal bleeding; [facility]  . COLONOSCOPY N/A 06/11/2014   Procedure: COLONOSCOPY;  Surgeon: Gatha Mayer, MD;  Location: [facility];  Service: Endoscopy;  Laterality: N/A;  . DILATION AND CURETTAGE OF UTERUS    . ESOPHAGOGASTRODUODENOSCOPY (EGD) WITH PROPOFOL N/A 04/06/2016   Procedure: ESOPHAGOGASTRODUODENOSCOPY (EGD) WITH PROPOFOL;  Surgeon: Alphonsa Overall, MD;  Location: [facility];  Service: General;  Laterality: N/A;  . FOOT SURGERY Bilateral    x 2  . GASTRIC ROUX-EN-Y N/A 12/14/2016   Procedure: LAPAROSCOPIC REVISION SLEEVE GASTRECTOMY TO  ROUX-Y-GASTRIC BY-PASS, UPPER ENDO;  Surgeon: Excell Seltzer, MD;  Location: [facility];  Service: General;  Laterality: N/A;  . GASTROJEJUNOSTOMY N/A 05/11/2016   Procedure: LAPAROSCOPIC PLACEMENT  OF FEEDING  JEJUNOSTOMY TUBE;  Surgeon: Excell Seltzer, MD;  Location: [facility];  Service: General;  Laterality: N/A;  . HEMORRHOIDECTOMY WITH HEMORRHOID BANDING    . IR GENERIC HISTORICAL  05/19/2016   IR CM INJ ANY COLONIC TUBE W/FLUORO 05/19/2016 Aletta Edouard, MD [facility]  . KNEE ARTHROSCOPY Left 09/06/2014  . LAPAROSCOPIC GASTRIC SLEEVE RESECTION N/A 01/14/2016   Procedure:  LAPAROSCOPIC GASTRIC SLEEVE RESECTION;  Surgeon: Excell Seltzer, MD;  Location: [facility];  Service: General;  Laterality: N/A;  . LAPAROSCOPIC TUBAL LIGATION  10/16/2011   Procedure: LAPAROSCOPIC TUBAL LIGATION;  Surgeon: Alwyn Pea, MD;  Location: [facility];  Service: Gynecology;  Laterality: Bilateral;  . NOVASURE ABLATION  09/28/2010   mild persistent vaginal bleeding  . right knee arthroscopy     05/07/2016  . TUBAL LIGATION    . wisdom teeth extracted      Medications reviewed. Current Outpatient Prescriptions  Medication Sig Dispense Refill  . albuterol (PROVENTIL HFA;VENTOLIN HFA) 108 (90 Base) MCG/ACT inhaler Inhale 2 puffs into the lungs every 6 (six) hours as needed for wheezing or shortness of breath. For shortness of breath 54 g 0  . Cyanocobalamin (VITAMIN B-12 SL) Place 1 tablet under the tongue daily.    . diclofenac sodium (VOLTAREN) 1 % GEL Apply 1 application topically 4 (four) times daily as needed (For knee pain.). Reported on 04/01/2016    . fluticasone (FLONASE) 50 MCG/ACT nasal spray Place 1 spray into both nostrils daily as needed for allergies. Reported on 04/01/2016    . HYDROcodone-acetaminophen (HYCET) 7.5-325 mg/15 ml solution Take 10 mLs by mouth every 6 (six) hours as needed for moderate pain. 120 mL 0  . Multiple Vitamin (MULTIVITAMIN) LIQD Place 15 mLs into feeding tube daily. 250 mL prn  . ondansetron (ZOFRAN-ODT) 8 MG disintegrating tablet Take 1 tablet (8 mg total) by mouth every 8 (eight) hours as needed for nausea. 20 tablet 3  . pantoprazole (PROTONIX) 40 MG tablet Take 40 mg by mouth 2 (two) times daily. Reported on 04/01/2016    . thiamine (VITAMIN B-1) 100 MG tablet Take 100 mg by mouth daily. Crushed     . EPINEPHrine 0.3 mg/0.3 mL IJ SOAJ injection Inject 0.3 mg into the muscle once as needed (For anaphylaxis.). Reported on 04/01/2016     Current Facility-Administered Medications  Medication Dose Route Frequency Provider Last Rate Last Dose  .  cyanocobalamin ((VITAMIN B-12)) injection 1,000 mcg  1,000 mcg Intramuscular Q30 days Sedalia Muta, FNP   1,000 mcg at 08/26/16 0849     Physical Exam:  BP 132/76   Pulse (!) 106   Temp 98.8 F (37.1 C)   Resp 16   Ht 5' 6.5" (1.689 m)   Wt 197 lb (89.4 kg)   SpO2 99%   BMI 31.32 kg/m  Gen:  Alert, cooperative patient who appears stated age in no acute distress.  Vital signs reviewed. HEENT: EOMI,  MMM Pulm:  Clear to auscultation bilaterally with good air movement.  No wheezes or rales noted.   Cardiac:  Regular rate and rhythm without murmur auscultated.  Good S1/S2. Abd:  Soft/nondistended/nontender.  Healing jejunostomy scar with sutures still intact left side of abdomen.  No drainage.   GYN:  External genitalia within normal limits.  Vaginal mucosa pink, moist, normal rugae.  Nonfriable cervix without lesions.  Thick white discharged noted on speculum exam. Wet prep and GC-Chlamydia obtained.    Assessment and Plan:  1.  Yeast vaginitis: - treating with diflucan.   - checking for other STDs - will contact with results.   2.  B12 injection - given today - checking B12 levels

## 2016-12-21 LAB — VITAMIN B12: VITAMIN B 12: 1081 pg/mL (ref 232–1245)

## 2016-12-21 LAB — HIV ANTIBODY (ROUTINE TESTING W REFLEX): HIV SCREEN 4TH GENERATION: NONREACTIVE

## 2016-12-21 LAB — RPR: RPR Ser Ql: NONREACTIVE

## 2016-12-22 LAB — GC/CHLAMYDIA PROBE AMP
CHLAMYDIA, DNA PROBE: NEGATIVE
NEISSERIA GONORRHOEAE BY PCR: NEGATIVE

## 2016-12-23 ENCOUNTER — Telehealth (HOSPITAL_COMMUNITY): Payer: Self-pay

## 2016-12-23 NOTE — Telephone Encounter (Signed)
Made discharge phone call to patient.  Asking the following questions.    1. Do you have someone to care for you now that you are home?  independent 2. Are you having pain now that is not relieved by your pain medication?  Too strong not taking prescribed pain medication, talked about liquid Tylenol for pain relief.  Patient understands not to take pain med and tylenol together. 3. Are you able to drink the recommended daily amount of fluids (48 ounces minimum/day) and protein (60-80 grams/day) as prescribed by the dietitian or nutritional counselor?  Patient reports 36-42 ounces of fluid a day. Does state that water tastes metallic.  We discussed different brands and flavoring to mask the taste.  We set a goal to increase her clear fluid by half.  She states she is getting a little more than 2 shakes down, but vomits sometimes.  She is taking nausea medication.  She is to see Dr Excell Seltzer in the morning for suture removal.  To discuss volume and emesis with him in the am 4. Are you taking the vitamins and minerals as prescribed? Talking liquid multivitamin has not been taking calcium.  Says the chews don't work for her.  Explained that pharmacy has liquid calcium Alisa Graff available that might work better.  I offered to meet her at the pharmacy to help obtain liquid calcium 5. Do you have the "on call" number to contact your surgeon if you have a problem or question?  Yes she has [facility] #.  I shared [employer] # at [facility] to follow up with if she had further nutrition questions.  She has post op class scheduled for 12/29/16 6. Are your incisions free of redness, swelling or drainage? (If steri strips, address that these can fall off, shower as tolerated) incisions are good going to office tomorrow to have sutures removed  7. Have your bowels moved since your surgery?  If not, are you passing gas?  Stated she had a hard stool that she manually had to disimpact.  Discussed use of MOM or Miralax she stated she could  not drink that much fluid.  We discussed glycerin suppository as an option. She felt that may be a better option for her 8. Are you up and walking 3-4 times per day?  yes 9. Were you provided your discharge medications before your surgery or before you were discharged from the hospital and are you taking them without problem?  Had meds at discharge.  See above regarding pain med and vitamin

## 2016-12-24 ENCOUNTER — Other Ambulatory Visit: Payer: Self-pay

## 2016-12-24 DIAGNOSIS — K9423 Gastrostomy malfunction: Secondary | ICD-10-CM | POA: Diagnosis not present

## 2016-12-24 LAB — BASIC METABOLIC PANEL
BUN / CREAT RATIO: 16 (ref 9–23)
BUN: 14 mg/dL (ref 6–20)
CHLORIDE: 105 mmol/L (ref 96–106)
CO2: 28 mmol/L (ref 18–29)
Calcium: 9.4 mg/dL (ref 8.7–10.2)
Creatinine, Ser: 0.86 mg/dL (ref 0.57–1.00)
GFR calc non Af Amer: 86 mL/min/{1.73_m2} (ref >59)
GFR, EST AFRICAN AMERICAN: 99 mL/min/{1.73_m2} (ref >59)
Glucose: 90 mg/dL (ref 65–99)
POTASSIUM: 4.4 mmol/L (ref 3.5–5.2)
Sodium: 137 mmol/L (ref 134–144)

## 2016-12-29 ENCOUNTER — Encounter: Payer: 59 | Attending: Family Medicine | Admitting: Skilled Nursing Facility1

## 2016-12-29 ENCOUNTER — Encounter: Payer: Self-pay | Admitting: Skilled Nursing Facility1

## 2016-12-29 DIAGNOSIS — E669 Obesity, unspecified: Secondary | ICD-10-CM | POA: Insufficient documentation

## 2016-12-29 DIAGNOSIS — Z713 Dietary counseling and surveillance: Secondary | ICD-10-CM | POA: Insufficient documentation

## 2016-12-29 DIAGNOSIS — Z9884 Bariatric surgery status: Secondary | ICD-10-CM | POA: Diagnosis not present

## 2016-12-29 DIAGNOSIS — Z6830 Body mass index (BMI) 30.0-30.9, adult: Secondary | ICD-10-CM | POA: Insufficient documentation

## 2016-12-29 DIAGNOSIS — Z6841 Body Mass Index (BMI) 40.0 and over, adult: Secondary | ICD-10-CM

## 2016-12-29 NOTE — Progress Notes (Signed)
Bariatric Class:  Appt start time: 1530 end time:  1630.  2 Week Post-Operative Nutrition Class  Patient was seen on 12/29/2016 for Post-Operative Nutrition education at [facility].   Pt states she Does not like to leave the house due to self image/depression. Pt states that she speed walks during commercial breaks and when walking from car to buildings for appts. Pt states that she lays and sleeps 16 hours during the day and waking at 1am to help manage nausea. Pt states she has severe back pain constantly, a soreness with shoulder pain. Pt states she Gets 2 full protein shakes, throwing up the protein shake because it is gross, feels tickle in my throat thinks maybe this is due to not being use to po status, had a cookie with nausea, eggs: made nauseas with bloat, burping a lot, meat makes her sick, chew and spit for the flavor, anything out of a can tastes like metal, bouillon cube is gross, unjury chicken soup is gross, water will cause vomiting but ice chips are okay, using a water bottle, scared to be sick due to cousin passing away in Nov. [id number] from complications related to gastric bypass surgery. Pt states "Everything I don't like makes me sick so I expect to be sick." Pt states yogurt does not work for her. Pt states the Sugar free coffee from [employer] she enjoys, able to handle 2 Lays chips on her tongue and lets them disintegrate, takes liquid multivitamin. 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 Pt states she used to see a therapist in [place] but has not recently worked with anyone. Pt states she likes Dr. Margarita Grizzle and has been meaning to call him for support but cannot bring herself to do it. Dietitian advised she contact Dr. Laurie Panda office directly after the appointment.   Pts labs:hemoglobin 11; HCT 34.1.  Surgery date: 12/14/2016 Surgery type: RYGB [facility] weight at [facility]: 230 Weight today: 195.8 Weight change: 35  TANITA  BODY COMP RESULTS  12/29/2016   BMI (kg/m^2) 31.1   Fat Mass (lbs) 86.4   Fat Free Mass (lbs) 109.4   Total Body Water (lbs) 78.8   The following the learning objectives were met by the patient during this course:  Identifies Phase 3A (Soft, High Proteins) Dietary Goals and will begin from 2 weeks post-operatively to 2 months post-operatively  Identifies appropriate sources of fluids and proteins   States protein recommendations and appropriate sources post-operatively  Identifies the need for appropriate texture modifications, mastication, and bite sizes when consuming solids  Identifies appropriate multivitamin and calcium sources post-operatively  Describes the need for physical activity post-operatively and will follow MD recommendations  States when to call healthcare provider regarding medication questions or post-operative complications  Handouts given during class include:  Phase 3A: Soft, High Protein Diet Handout  Follow-Up Plan: Patient will follow-up at [facility] in 6 weeks for 2 month post-op nutrition visit for diet advancement per MD.

## 2017-01-05 ENCOUNTER — Ambulatory Visit: Payer: 59 | Admitting: Family Medicine

## 2017-01-11 DIAGNOSIS — R112 Nausea with vomiting, unspecified: Secondary | ICD-10-CM | POA: Diagnosis not present

## 2017-01-14 DIAGNOSIS — G8929 Other chronic pain: Secondary | ICD-10-CM | POA: Diagnosis not present

## 2017-01-14 DIAGNOSIS — M545 Low back pain: Secondary | ICD-10-CM | POA: Diagnosis not present

## 2017-01-20 ENCOUNTER — Ambulatory Visit (INDEPENDENT_AMBULATORY_CARE_PROVIDER_SITE_OTHER): Payer: 59 | Admitting: Family Medicine

## 2017-01-20 DIAGNOSIS — Z9884 Bariatric surgery status: Secondary | ICD-10-CM | POA: Diagnosis not present

## 2017-01-20 DIAGNOSIS — E538 Deficiency of other specified B group vitamins: Secondary | ICD-10-CM | POA: Diagnosis not present

## 2017-01-20 NOTE — Progress Notes (Signed)
Pt was here to have b12 injection.

## 2017-01-20 NOTE — Patient Instructions (Signed)
     IF you received an x-ray today, you will receive an invoice from [facility]. Please contact [facility] at [phone] with questions or concerns regarding your invoice.   IF you received labwork today, you will receive an invoice from [facility]. Please contact [facility] at [phone] with questions or concerns regarding your invoice.   Our billing staff will not be able to assist you with questions regarding bills from these companies.  You will be contacted with the lab results as soon as they are available. The fastest way to get your results is to activate your My Chart account. Instructions are located on the last page of this paperwork. If you have not heard from us regarding the results in 2 weeks, please contact this office.     

## 2017-01-22 DIAGNOSIS — M47816 Spondylosis without myelopathy or radiculopathy, lumbar region: Secondary | ICD-10-CM | POA: Diagnosis not present

## 2017-01-22 DIAGNOSIS — M5136 Other intervertebral disc degeneration, lumbar region: Secondary | ICD-10-CM | POA: Diagnosis not present

## 2017-01-30 ENCOUNTER — Emergency Department (HOSPITAL_COMMUNITY)
Admission: EM | Admit: 2017-01-30 | Discharge: 2017-01-30 | Disposition: A | Discharge status: Home / Self Care | Payer: 59 | Attending: Emergency Medicine | Admitting: Emergency Medicine

## 2017-01-30 ENCOUNTER — Emergency Department (HOSPITAL_COMMUNITY): Payer: 59

## 2017-01-30 ENCOUNTER — Encounter (HOSPITAL_COMMUNITY): Payer: Self-pay

## 2017-01-30 DIAGNOSIS — Z79899 Other long term (current) drug therapy: Secondary | ICD-10-CM | POA: Diagnosis not present

## 2017-01-30 DIAGNOSIS — R1011 Right upper quadrant pain: Secondary | ICD-10-CM | POA: Diagnosis present

## 2017-01-30 DIAGNOSIS — Z87891 Personal history of nicotine dependence: Secondary | ICD-10-CM | POA: Diagnosis not present

## 2017-01-30 DIAGNOSIS — R1013 Epigastric pain: Secondary | ICD-10-CM | POA: Diagnosis not present

## 2017-01-30 DIAGNOSIS — J45909 Unspecified asthma, uncomplicated: Secondary | ICD-10-CM | POA: Insufficient documentation

## 2017-01-30 DIAGNOSIS — R109 Unspecified abdominal pain: Secondary | ICD-10-CM | POA: Diagnosis not present

## 2017-01-30 LAB — URINALYSIS, ROUTINE W REFLEX MICROSCOPIC
Bilirubin Urine: NEGATIVE
Glucose, UA: NEGATIVE mg/dL
Hgb urine dipstick: NEGATIVE
Ketones, ur: 5 mg/dL — AB
LEUKOCYTES UA: NEGATIVE
Nitrite: NEGATIVE
PH: 7 (ref 5.0–8.0)
Protein, ur: 30 mg/dL — AB
SPECIFIC GRAVITY, URINE: 1.028 (ref 1.005–1.030)

## 2017-01-30 LAB — CBC
HEMATOCRIT: 34.6 % — AB (ref 36.0–46.0)
Hemoglobin: 11 g/dL — ABNORMAL LOW (ref 12.0–15.0)
MCH: 26.7 pg (ref 26.0–34.0)
MCHC: 31.8 g/dL (ref 30.0–36.0)
MCV: 84 fL (ref 78.0–100.0)
PLATELETS: 416 10*3/uL — AB (ref 150–400)
RBC: 4.12 MIL/uL (ref 3.87–5.11)
RDW: 15.1 % (ref 11.5–15.5)
WBC: 10.3 10*3/uL (ref 4.0–10.5)

## 2017-01-30 LAB — COMPREHENSIVE METABOLIC PANEL
ALBUMIN: 3.2 g/dL — AB (ref 3.5–5.0)
ALK PHOS: 53 U/L (ref 38–126)
ALT: 7 U/L — ABNORMAL LOW (ref 14–54)
ANION GAP: 5 (ref 5–15)
AST: 13 U/L — ABNORMAL LOW (ref 15–41)
BILIRUBIN TOTAL: 0.4 mg/dL (ref 0.3–1.2)
BUN: 10 mg/dL (ref 6–20)
CO2: 22 mmol/L (ref 22–32)
CREATININE: 0.66 mg/dL (ref 0.44–1.00)
Calcium: 8.4 mg/dL — ABNORMAL LOW (ref 8.9–10.3)
Chloride: 110 mmol/L (ref 101–111)
GFR calc Af Amer: >60 mL/min (ref >60)
GFR calc non Af Amer: >60 mL/min (ref >60)
Glucose, Bld: 82 mg/dL (ref 65–99)
Potassium: 3.9 mmol/L (ref 3.5–5.1)
SODIUM: 137 mmol/L (ref 135–145)
TOTAL PROTEIN: 6.4 g/dL — AB (ref 6.5–8.1)

## 2017-01-30 LAB — LIPASE, BLOOD: Lipase: 63 U/L — ABNORMAL HIGH (ref 11–51)

## 2017-01-30 MED ORDER — ONDANSETRON HCL 4 MG/2ML IJ SOLN: 4.0000 mg | Freq: Once | INTRAMUSCULAR | Status: DC | PRN

## 2017-01-30 MED ORDER — FAMOTIDINE 20 MG PO TABS
40.0000 mg | ORAL_TABLET | Freq: Once | ORAL | Status: AC
  Administered 2017-01-30: 40 mg via ORAL
  Filled 2017-01-30: qty 2

## 2017-01-30 MED ORDER — SUCRALFATE 1 G PO TABS: 1.0000 g | ORAL_TABLET | Freq: Four times a day (QID) | ORAL | 0 refills | Status: DC

## 2017-01-30 MED ORDER — FAMOTIDINE 20 MG PO TABS: 20.0000 mg | ORAL_TABLET | Freq: Two times a day (BID) | ORAL | 0 refills | Status: DC

## 2017-01-30 MED ORDER — IOPAMIDOL (ISOVUE-300) INJECTION 61%
INTRAVENOUS | Status: AC
  Administered 2017-01-30: 30 mL via ORAL
  Filled 2017-01-30: qty 30

## 2017-01-30 MED ORDER — IOPAMIDOL (ISOVUE-300) INJECTION 61%
INTRAVENOUS | Status: AC
  Filled 2017-01-30: qty 100

## 2017-01-30 MED ORDER — IOPAMIDOL (ISOVUE-300) INJECTION 61%
INTRAVENOUS | Status: AC
  Administered 2017-01-30: 100 mL via INTRAVENOUS
  Filled 2017-01-30: qty 100

## 2017-01-30 MED ORDER — SODIUM CHLORIDE 0.9 % IV BOLUS (SEPSIS)
1000.0000 mL | Freq: Once | INTRAVENOUS | Status: AC
  Administered 2017-01-30: 1000 mL via INTRAVENOUS

## 2017-01-30 MED ORDER — GI COCKTAIL ~~LOC~~
30.0000 mL | Freq: Once | ORAL | Status: AC
  Administered 2017-01-30: 30 mL via ORAL
  Filled 2017-01-30: qty 30

## 2017-01-30 MED ORDER — ONDANSETRON HCL 4 MG/2ML IJ SOLN
4.0000 mg | Freq: Once | INTRAMUSCULAR | Status: AC
  Administered 2017-01-30: 4 mg via INTRAVENOUS
  Filled 2017-01-30: qty 2

## 2017-01-30 MED ORDER — SUCRALFATE 1 G PO TABS
1.0000 g | ORAL_TABLET | Freq: Once | ORAL | Status: AC
  Administered 2017-01-30: 1 g via ORAL
  Filled 2017-01-30: qty 1

## 2017-01-30 MED ORDER — HYDROMORPHONE HCL 1 MG/ML IJ SOLN
1.0000 mg | Freq: Once | INTRAMUSCULAR | Status: AC
  Administered 2017-01-30: 1 mg via INTRAVENOUS
  Filled 2017-01-30: qty 1

## 2017-01-30 NOTE — ED Provider Notes (Signed)
[facility] DEPT Provider Note   CSN: [id number] Arrival date & time: 01/30/17  1126     History   Chief Complaint Chief Complaint  Patient presents with  . Abdominal Pain    HPI [name] is a 41 y.o. female.  41 year old female presents with pain in her right upper quadrant for about a week. Pain is worse when she each or drinks and radiates to her epigastric region as well as her back. She's had nonbilious emesis without fever or chills. No diarrhea. Denies any urinary symptoms. Does have a history of gastric bypass surgery in the past. Has used over-the-counter medications which has not helped her. Nothing makes her symptoms better.      Past Medical History:  Diagnosis Date  . Anal fissure - posterior 10/16/2014  . Anxiety    doesn't take anything  . Asthma    has Albuterol inhaler as needed  . Bronchitis   . Chronic headache disorder 07/22/2016  . Clostridium difficile infection 04/20/2012  . Colitis   . Dehydration   . Depression    doesn't take any meds  . Family history of adverse reaction to anesthesia    pt mom gets sick  . GERD (gastroesophageal reflux disease)    takes Pantoprazole daily  . Headache   . Hiatal hernia   . History of blood transfusion    last transfusion was 04/04/2016=Benadryl was given d/t itching. States she always itches with transfusion.   Marland Kitchen History of bronchitis    > 2 yrs ago  . History of colon polyps    benign  . History of migraine    last one 05/01/16  . History of urinary tract infection   . IDA (iron deficiency anemia)   . Internal and external bleeding hemorrhoids 06/11/2014  . Joint pain   . Joint swelling   . Left sided chronic colitis - segmental 06/11/2014  . Migraine   . Motion sickness   . Nausea    takes Zofran as needed  . Obesity   . Oligouria   . Osteoarthritis    back  . Pneumonia    hx of-yrs ago  . Postoperative nausea and vomiting 01/21/2016  . TOBACCO USER 10/02/2009   Qualifier: Diagnosis of   By: Dimas Millin MD, Ellard Artis    . Transfusion history    last transfusion 6'16   . UC (ulcerative colitis) ([place])    supposed to be taking Lialda and Bentyl but has been off since gastric sleeve  . Ulcerative colitis ([place])   . Vertigo    doesn't take any meds    Patient Active Problem List   Diagnosis Date Noted  . Nausea and vomiting 12/14/2016  . Paresthesia 07/22/2016  . Chronic headache disorder 07/22/2016  . Memory difficulty 07/22/2016  . GI bleed 04/04/2016  . Hematochezia 04/04/2016  . 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 Postoperative nausea and vomiting 01/21/2016  . Morbid obesity with BMI of 45.0-49.9, adult ([place]) 01/14/2016  . Anemia, iron deficiency 10/01/2015  . Skin lesion 05/18/2015  . Anal fissure - posterior 10/16/2014  . Severe obesity (BMI >= 40) ([place]) 10/16/2014  . Left sided chronic colitis - segmental 06/11/2014  . Internal and external bleeding hemorrhoids 06/11/2014  . Asthma 04/26/2012  . Excessive or frequent menstruation 10/16/2011  . DEPRESSION, MILD 10/02/2009  . INTERNAL HEMORRHOIDS 11/15/2007    Past Surgical History:  Procedure Laterality Date  . COLONOSCOPY  2007   for rectal bleeding; [facility]  .  COLONOSCOPY N/A 06/11/2014   Procedure: COLONOSCOPY;  Surgeon: Gatha Mayer, MD;  [facility];  Service: Endoscopy;  Laterality: N/A;  . DILATION AND CURETTAGE OF UTERUS    . ESOPHAGOGASTRODUODENOSCOPY (EGD) WITH PROPOFOL N/A 04/06/2016   Procedure: ESOPHAGOGASTRODUODENOSCOPY (EGD) WITH PROPOFOL;  Surgeon: Alphonsa Overall, MD;  [facility];  Service: General;  Laterality: N/A;  . FOOT SURGERY Bilateral    x 2  . GASTRIC ROUX-EN-Y N/A 12/14/2016   Procedure: LAPAROSCOPIC REVISION SLEEVE GASTRECTOMY TO  ROUX-Y-GASTRIC BY-PASS, UPPER ENDO;  Surgeon: Excell Seltzer, MD;  [facility];  Service: General;  Laterality: N/A;  . GASTROJEJUNOSTOMY N/A 05/11/2016   Procedure: LAPAROSCOPIC PLACEMENT  OF FEEDING  JEJUNOSTOMY  TUBE;  Surgeon: Excell Seltzer, MD;  [facility];  Service: General;  Laterality: N/A;  . HEMORRHOIDECTOMY WITH HEMORRHOID BANDING    . IR GENERIC HISTORICAL  05/19/2016   IR CM INJ ANY COLONIC TUBE W/FLUORO 05/19/2016 Aletta Edouard, MD [facility]  . KNEE ARTHROSCOPY Left 09/06/2014  . LAPAROSCOPIC GASTRIC SLEEVE RESECTION N/A 01/14/2016   Procedure: LAPAROSCOPIC GASTRIC SLEEVE RESECTION;  Surgeon: Excell Seltzer, MD;  [facility];  Service: General;  Laterality: N/A;  . LAPAROSCOPIC TUBAL LIGATION  10/16/2011   Procedure: LAPAROSCOPIC TUBAL LIGATION;  Surgeon: Alwyn Pea, MD;  Location: [facility];  Service: Gynecology;  Laterality: Bilateral;  . NOVASURE ABLATION  09/28/2010   mild persistent vaginal bleeding  . right knee arthroscopy     05/07/2016  . TUBAL LIGATION    . wisdom teeth extracted      OB History    No data available       Home Medications    Prior to Admission medications   Medication Sig Start Date End Date Taking? Authorizing Provider  albuterol (PROVENTIL HFA;VENTOLIN HFA) 108 (90 Base) MCG/ACT inhaler Inhale 2 puffs into the lungs every 6 (six) hours as needed for wheezing or shortness of breath. For shortness of breath 09/22/16   Wardell Honour, MD  Cyanocobalamin (VITAMIN B-12 SL) Place 1 tablet under the tongue daily.    [provider]  diclofenac sodium (VOLTAREN) 1 % GEL Apply 1 application topically 4 (four) times daily as needed (For knee pain.). Reported on 04/01/2016    [provider]  EPINEPHrine 0.3 mg/0.3 mL IJ SOAJ injection Inject 0.3 mg into the muscle once as needed (For anaphylaxis.). Reported on 04/01/2016    [provider]  fluticasone (FLONASE) 50 MCG/ACT nasal spray Place 1 spray into both nostrils daily as needed for allergies. Reported on 04/01/2016    [provider]  HYDROcodone-acetaminophen (HYCET) 7.5-325 mg/15 ml solution Take 10 mLs by mouth every 6 (six) hours as needed for moderate  pain. 12/17/16   Jerrye Beavers, PA-C  Multiple Vitamin (MULTIVITAMIN) LIQD Place 15 mLs into feeding tube daily. 05/15/16   Excell Seltzer, MD  ondansetron (ZOFRAN-ODT) 8 MG disintegrating tablet Take 1 tablet (8 mg total) by mouth every 8 (eight) hours as needed for nausea. 03/25/16   Wardell Honour, MD  pantoprazole (PROTONIX) 40 MG tablet Take 40 mg by mouth 2 (two) times daily. Reported on 04/01/2016 01/09/16   [provider]  thiamine (VITAMIN B-1) 100 MG tablet Take 100 mg by mouth daily. Crushed     [provider]  traMADol (ULTRAM) 50 MG tablet Take 1 tablet (50 mg total) by mouth every 8 (eight) hours as needed. 12/19/16   Alveda Reasons, MD    Family History  Family History  Problem Relation Age of Onset  . Hypertension Mother   . Diabetes Mother   . Sarcoidosis Mother     lungs and skin  . Asthma Mother   . Hypertension Father   . Diabetes Father   . Asthma Son   . Tics Son   . Asthma Sister   . Cancer Sister     possible pancreatic cancer  . Adrenal disorder Sister     Tumor   . Asthma Brother   . Asthma Daughter     died age 34.5  . Cancer Daughter 4    brain; died age 34.5  . Asthma Son   . Cancer Paternal Aunt     brain, colon, lung and esophagus; unsure of primary     Social History Social History  Substance Use Topics  . Smoking status: Former Smoker    Packs/day: 0.50    Years: 20.00    Types: Cigarettes    Quit date: 03/17/2014  . Smokeless tobacco: Never Used  . Alcohol use No     Allergies   Oxycodone; Ciprofloxacin; Coconut flavor; Doxycycline; Morphine and related; Other; and Penicillins   Review of Systems Review of Systems  All other systems reviewed and are negative.    Physical Exam Updated Vital Signs BP 131/82 (BP Location: Left Arm)   Pulse 82   Temp 98.4 F (36.9 C) (Oral)   Resp 18   LMP 01/20/2017 (Approximate)   SpO2 100%   Physical Exam  Constitutional: She is oriented to person, place, and  time. She appears well-developed and well-nourished.  Non-toxic appearance. No distress.  HENT:  Head: Normocephalic and atraumatic.  Eyes: Conjunctivae, EOM and lids are normal. Pupils are equal, round, and reactive to light.  Neck: Normal range of motion. Neck supple. No tracheal deviation present. No thyroid mass present.  Cardiovascular: Normal rate, regular rhythm and normal heart sounds.  Exam reveals no gallop.   No murmur heard. Pulmonary/Chest: Effort normal and breath sounds normal. No stridor. No respiratory distress. She has no decreased breath sounds. She has no wheezes. She has no rhonchi. She has no rales.  Abdominal: Soft. Normal appearance and bowel sounds are normal. She exhibits no distension. There is tenderness in the right upper quadrant and epigastric area. There is guarding. There is no rigidity, no rebound and no CVA tenderness.    Musculoskeletal: Normal range of motion. She exhibits no edema or tenderness.  Neurological: She is alert and oriented to person, place, and time. She has normal strength. No cranial nerve deficit or sensory deficit. GCS eye subscore is 4. GCS verbal subscore is 5. GCS motor subscore is 6.  Skin: Skin is warm and dry. No abrasion and no rash noted.  Psychiatric: She has a normal mood and affect. Her speech is normal and behavior is normal.  Nursing note and vitals reviewed.    ED Treatments / Results  Labs (all labs ordered are listed, but only abnormal results are displayed) Labs Reviewed  LIPASE, BLOOD  COMPREHENSIVE METABOLIC PANEL  CBC  URINALYSIS, ROUTINE W REFLEX MICROSCOPIC    EKG  EKG Interpretation None       Radiology No results found.  Procedures Procedures (including critical care time)  Medications Ordered in ED Medications  ondansetron (ZOFRAN) injection 4 mg (not administered)  sodium chloride 0.9 % bolus 1,000 mL (not administered)  ondansetron (ZOFRAN) injection 4 mg (not administered)  HYDROmorphone  (DILAUDID) injection 1 mg (not administered)  Initial Impression / Assessment and Plan / ED Course  I have reviewed the triage vital signs and the nursing notes.  Pertinent labs & imaging results that were available during my care of the patient were reviewed by me and considered in my medical decision making (see chart for details).     Patient's CAT scan negative for intra-abdominal process. Medicate with pain here and does feel slightly better. Was seen by general surgery who felt that this is peptic ulcer disease related. Will be prescribed Pepcid and Carafate.  Final Clinical Impressions(s) / ED Diagnoses   Final diagnoses:  None    New Prescriptions New Prescriptions   No medications on file     Lacretia Leigh, MD 01/30/17 1640

## 2017-01-30 NOTE — ED Triage Notes (Signed)
She c/o ruq area abd. Pain which is worse when she eats/drinks for about a week. She tells [place] she has hx of bariatric surgery.

## 2017-01-30 NOTE — ED Notes (Signed)
Patient transported to CT 

## 2017-01-30 NOTE — Consult Note (Signed)
Reason for Consult:abd pain Referring Physician: Dr Madison Hickman  [name] is an 41 y.o. female.  HPI: 41 y.o. F s/p conversion of a sleeve gastrectomy to RNY gastric bypass in March by Dr Excell Seltzer.  She presents to the ED with midepigastric pain that worsens right after eating.  This has been occurring for ~1 week.  She is on a liquid diet and denies any NSAID use or smoking.  She takes a PPI BID.    Past Medical History:  Diagnosis Date  . Anal fissure - posterior 10/16/2014  . Anxiety    doesn't take anything  . Asthma    has Albuterol inhaler as needed  . Bronchitis   . Chronic headache disorder 07/22/2016  . Clostridium difficile infection 04/20/2012  . Colitis   . Dehydration   . Depression    doesn't take any meds  . Family history of adverse reaction to anesthesia    pt mom gets sick  . GERD (gastroesophageal reflux disease)    takes Pantoprazole daily  . Headache   . Hiatal hernia   . History of blood transfusion    last transfusion was 04/04/2016=Benadryl was given d/t itching. States she always itches with transfusion.   Marland Kitchen History of bronchitis    > 2 yrs ago  . History of colon polyps    benign  . History of migraine    last one 05/01/16  . History of urinary tract infection   . IDA (iron deficiency anemia)   . Internal and external bleeding hemorrhoids 06/11/2014  . Joint pain   . Joint swelling   . Left sided chronic colitis - segmental 06/11/2014  . Migraine   . Motion sickness   . Nausea    takes Zofran as needed  . Obesity   . Oligouria   . Osteoarthritis    back  . Pneumonia    hx of-yrs ago  . Postoperative nausea and vomiting 01/21/2016  . TOBACCO USER 10/02/2009   Qualifier: Diagnosis of  By: Dimas Millin MD, Ellard Artis    . Transfusion history    last transfusion 6'16   . UC (ulcerative colitis) ([place])    supposed to be taking Lialda and Bentyl but has been off since gastric sleeve  . Ulcerative colitis ([place])   . Vertigo    doesn't take any meds    Past  Surgical History:  Procedure Laterality Date  . COLONOSCOPY  2007   for rectal bleeding; [facility]  . COLONOSCOPY N/A 06/11/2014   Procedure: COLONOSCOPY;  Surgeon: Gatha Mayer, MD;  [facility];  Service: Endoscopy;  Laterality: N/A;  . DILATION AND CURETTAGE OF UTERUS    . ESOPHAGOGASTRODUODENOSCOPY (EGD) WITH PROPOFOL N/A 04/06/2016   Procedure: ESOPHAGOGASTRODUODENOSCOPY (EGD) WITH PROPOFOL;  Surgeon: Alphonsa Overall, MD;  [facility];  Service: General;  Laterality: N/A;  . FOOT SURGERY Bilateral    x 2  . GASTRIC ROUX-EN-Y N/A 12/14/2016   Procedure: LAPAROSCOPIC REVISION SLEEVE GASTRECTOMY TO  ROUX-Y-GASTRIC BY-PASS, UPPER ENDO;  Surgeon: Excell Seltzer, MD;  [facility];  Service: General;  Laterality: N/A;  . GASTROJEJUNOSTOMY N/A 05/11/2016   Procedure: LAPAROSCOPIC PLACEMENT  OF FEEDING  JEJUNOSTOMY TUBE;  Surgeon: Excell Seltzer, MD;  [facility];  Service: General;  Laterality: N/A;  . HEMORRHOIDECTOMY WITH HEMORRHOID BANDING    . IR GENERIC HISTORICAL  05/19/2016   IR CM INJ ANY COLONIC TUBE W/FLUORO 05/19/2016 Aletta Edouard, MD [facility]  . KNEE ARTHROSCOPY Left  09/06/2014  . LAPAROSCOPIC GASTRIC SLEEVE RESECTION N/A 01/14/2016   Procedure: LAPAROSCOPIC GASTRIC SLEEVE RESECTION;  Surgeon: Excell Seltzer, MD;  [facility];  Service: General;  Laterality: N/A;  . LAPAROSCOPIC TUBAL LIGATION  10/16/2011   Procedure: LAPAROSCOPIC TUBAL LIGATION;  Surgeon: Alwyn Pea, MD;  Location: [facility];  Service: Gynecology;  Laterality: Bilateral;  . NOVASURE ABLATION  09/28/2010   mild persistent vaginal bleeding  . right knee arthroscopy     05/07/2016  . TUBAL LIGATION    . wisdom teeth extracted      Family History  Problem Relation Age of Onset  . Hypertension Mother   . Diabetes Mother   . Sarcoidosis Mother     lungs and skin  . Asthma Mother   . Hypertension Father   . Diabetes Father   . Asthma Son   . Tics Son   . Asthma  Sister   . Cancer Sister     possible pancreatic cancer  . Adrenal disorder Sister     Tumor   . Asthma Brother   . Asthma Daughter     died age 59.5  . Cancer Daughter 4    brain; died age 59.5  . Asthma Son   . Cancer Paternal Aunt     brain, colon, lung and esophagus; unsure of primary     Social History:  reports that she quit smoking about 2 years ago. Her smoking use included Cigarettes. She has a 10.00 pack-year smoking history. She has never used smokeless tobacco. She reports that she does not drink alcohol or use drugs.  Allergies:  Allergies  Allergen Reactions  . Oxycodone Anaphylaxis  . Ciprofloxacin Itching and Rash  . Coconut Flavor Swelling and Other (See Comments)    Itching in throat  . Doxycycline Nausea And Vomiting  . Morphine And Related Swelling and Other (See Comments)    Pt reported throat swelling to morphine.  Has tolerated Norco and Dilaudid.  . Other      PATIENT STATES BREAKS OUT WITH BOTH PAPER TAPE AND PLASTIC TAPE SO WOULD PREFER PLASTIC TAPE SO NOT TO BE CHANGED TO MANY TIMES!  Marland Kitchen Penicillins Rash and Other (See Comments)    .Marland KitchenHas patient had a PCN reaction causing immediate rash, facial/tongue/throat swelling, SOB or lightheadedness with hypotension: Yes Has patient had a PCN reaction causing severe rash involving mucus membranes or skin necrosis: No Has patient had a PCN reaction that required hospitalization UNKNOWN - childhood allergy  Has patient had a PCN reaction occurring within the last 10 years: No If all of the above answers are "NO", then may proceed with Cephalosporin use.     Medications: I have reviewed the patient's current medications.  Results for orders placed or performed during the hospital encounter of 01/30/17 (from the past 48 hour(s))  Urinalysis, Routine w reflex microscopic     Status: Abnormal   Collection Time: 01/30/17 11:44 AM  Result Value Ref Range   Color, Urine YELLOW YELLOW   APPearance CLOUDY (A) CLEAR    Specific Gravity, Urine 1.028 1.005 - 1.030   pH 7.0 5.0 - 8.0   Glucose, UA NEGATIVE NEGATIVE mg/dL   Hgb urine dipstick NEGATIVE NEGATIVE   Bilirubin Urine NEGATIVE NEGATIVE   Ketones, ur 5 (A) NEGATIVE mg/dL   Protein, ur 30 (A) NEGATIVE mg/dL   Nitrite NEGATIVE NEGATIVE   Leukocytes, UA NEGATIVE NEGATIVE   RBC / HPF 0-5 0 - 5 RBC/hpf   WBC, UA 6-30  0 - 5 WBC/hpf   Bacteria, UA RARE (A) NONE SEEN   Squamous Epithelial / LPF TOO NUMEROUS TO COUNT (A) NONE SEEN   Mucous PRESENT   CBC     Status: Abnormal   Collection Time: 01/30/17 12:31 PM  Result Value Ref Range   WBC 10.3 4.0 - 10.5 K/uL   RBC 4.12 3.87 - 5.11 MIL/uL   Hemoglobin 11.0 (L) 12.0 - 15.0 g/dL   HCT 34.6 (L) 36.0 - 46.0 %   MCV 84.0 78.0 - 100.0 fL   MCH 26.7 26.0 - 34.0 pg   MCHC 31.8 30.0 - 36.0 g/dL   RDW 15.1 11.5 - 15.5 %   Platelets 416 (H) 150 - 400 K/uL  Comprehensive metabolic panel     Status: Abnormal   Collection Time: 01/30/17  1:57 PM  Result Value Ref Range   Sodium 137 135 - 145 mmol/L   Potassium 3.9 3.5 - 5.1 mmol/L   Chloride 110 101 - 111 mmol/L   CO2 22 22 - 32 mmol/L   Glucose, Bld 82 65 - 99 mg/dL   BUN 10 6 - 20 mg/dL   Creatinine, Ser 0.66 0.44 - 1.00 mg/dL   Calcium 8.4 (L) 8.9 - 10.3 mg/dL   Total Protein 6.4 (L) 6.5 - 8.1 g/dL   Albumin 3.2 (L) 3.5 - 5.0 g/dL   AST 13 (L) 15 - 41 U/L   ALT 7 (L) 14 - 54 U/L   Alkaline Phosphatase 53 38 - 126 U/L   Total Bilirubin 0.4 0.3 - 1.2 mg/dL   GFR calc non Af Amer >60 >60 mL/min   GFR calc Af Amer >60 >60 mL/min    Comment: (NOTE) The eGFR has been calculated using the CKD EPI equation. This calculation has not been validated in all clinical situations. eGFR's persistently <60 mL/min signify possible Chronic Kidney Disease.    Anion gap 5 5 - 15  Lipase, blood     Status: Abnormal   Collection Time: 01/30/17  1:57 PM  Result Value Ref Range   Lipase 63 (H) 11 - 51 U/L    Ct Abdomen Pelvis W Contrast  Result Date:  01/30/2017 CLINICAL DATA:  41 year old female with postprandial right abdominal pain for 1 week. Prior bariatric surgery. EXAM: CT ABDOMEN AND PELVIS WITH CONTRAST TECHNIQUE: Multidetector CT imaging of the abdomen and pelvis was performed using the standard protocol following bolus administration of intravenous contrast. CONTRAST:  100 mL Isovue-300 COMPARISON:  CT Abdomen and Pelvis 01/22/2016 FINDINGS: Lower chest: Stable and negative lung bases. No pericardial or pleural effusion. Hepatobiliary: Negative liver and gallbladder. Pancreas: Negative. Spleen: Negative. Adrenals/Urinary Tract: Normal adrenal glands. Both kidneys appear normal. Bilateral renal enhancement is within normal limits. No perinephric stranding. No hydronephrosis or proximal hydroureter. No abdominal free fluid. Stomach/Bowel: Negative rectum. Mildly redundant sigmoid colon is otherwise negative. Decompressed left colon. Mildly redundant splenic flexure. Mild retained stool in the transverse colon. The cecum has a lax mesentery and is located at the right upper quadrant adjacent to the gallbladder fossa. Negative terminal ileum. Normal appendix adjacent to the right liver tip (coronal image 95). Oral contrast has not yet reached the terminal ileum. No dilated small bowel. 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 Small volume contrast in the distal stomach and duodenum C loop. Vascular/Lymphatic: Major arterial structures in the abdomen and pelvis appear normal. Portal venous system is patent. No lymphadenopathy. Reproductive: Multiple low-density left adnexal cysts measuring about 18 mm. These  are probably physiologic (series 2, images 61 and 65). Indistinct low-density in the right adnexa region is probably physiologic. Small surgical clip along the right uterine fundus is new, but does not resemble a typical tubal ligation clip. Other: No pelvic free fluid. Mild postoperative changes to the ventral abdominal wall.  Musculoskeletal: Chronic L5 pars fractures but no significant L5-S1 spondylolisthesis. Subtle dextroconvex lumbar spine curvature. No acute osseous abnormality identified. IMPRESSION: 1. No inflammatory process identified in the abdomen or pelvis. Normal appendix. 2. 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 No bowel obstruction. Incidentally, the cecum is on a lax mesentery and located in the right upper quadrant. 3. Evidence of multiple small left ovarian cyst, probably physiologic. Electronically Signed   By: Genevie Ann M.D.   On: 01/30/2017 15:57    Review of Systems  Constitutional: Negative for chills and fever.  HENT: Negative for hearing loss and sinus pain.   Eyes: Negative for blurred vision and double vision.  Respiratory: Negative for cough and shortness of breath.   Cardiovascular: Negative for chest pain and palpitations.  Gastrointestinal: Positive for abdominal pain. Negative for constipation, diarrhea, nausea and vomiting.  Genitourinary: Negative for dysuria, frequency and urgency.  Musculoskeletal: Negative for myalgias.  Skin: Negative for itching and rash.  Neurological: Negative for dizziness.   Blood pressure 100/65, pulse (!) 45, temperature 98.4 F (36.9 C), temperature source Oral, resp. rate 15, last menstrual period 01/20/2017, SpO2 100 %. Physical Exam  Constitutional: She is oriented to person, place, and time. She appears well-developed and well-nourished.  HENT:  Head: Normocephalic and atraumatic.  Eyes: Conjunctivae and EOM are normal. Pupils are equal, round, and reactive to light.  Neck: Normal range of motion. Neck supple.  Cardiovascular: Normal rate and regular rhythm.   Respiratory: Effort normal and breath sounds normal. No respiratory distress.  GI: Soft. She exhibits no distension. There is tenderness (mld midepigastric).  Musculoskeletal: Normal range of motion.  Neurological: She is alert and oriented to person, place, and time.  Skin: Skin  is warm and dry.    Assessment/Plan: 41 y.o. F s/p RNy gastric bypass with abdominal pain.  CT shows no evidence of internal hernia or obstruction.  She may be developing a marginal ulcer.  Would add Carafate prior to meals.  Pt should contact the office on Mon for further instructions.    Casondra Gasca C. 11/02/971, 5:32 PM

## 2017-01-31 ENCOUNTER — Other Ambulatory Visit: Payer: Self-pay | Admitting: Student

## 2017-01-31 ENCOUNTER — Encounter (HOSPITAL_COMMUNITY): Payer: Self-pay | Admitting: *Deleted

## 2017-01-31 ENCOUNTER — Inpatient Hospital Stay (HOSPITAL_COMMUNITY)
Admission: AD | Admit: 2017-01-31 | Discharge: 2017-01-31 | Disposition: A | Discharge status: Home / Self Care | Payer: 59 | Source: Ambulatory Visit | Attending: Obstetrics and Gynecology | Admitting: Obstetrics and Gynecology

## 2017-01-31 ENCOUNTER — Other Ambulatory Visit (HOSPITAL_COMMUNITY): Payer: Self-pay | Admitting: Student

## 2017-01-31 DIAGNOSIS — Z880 Allergy status to penicillin: Secondary | ICD-10-CM | POA: Insufficient documentation

## 2017-01-31 DIAGNOSIS — R1030 Lower abdominal pain, unspecified: Secondary | ICD-10-CM | POA: Insufficient documentation

## 2017-01-31 DIAGNOSIS — N920 Excessive and frequent menstruation with regular cycle: Secondary | ICD-10-CM | POA: Insufficient documentation

## 2017-01-31 DIAGNOSIS — K5190 Ulcerative colitis, unspecified, without complications: Secondary | ICD-10-CM | POA: Insufficient documentation

## 2017-01-31 DIAGNOSIS — Z79899 Other long term (current) drug therapy: Secondary | ICD-10-CM | POA: Insufficient documentation

## 2017-01-31 DIAGNOSIS — Z87891 Personal history of nicotine dependence: Secondary | ICD-10-CM | POA: Insufficient documentation

## 2017-01-31 LAB — POCT PREGNANCY, URINE: Preg Test, Ur: NEGATIVE

## 2017-01-31 MED ORDER — KETOROLAC TROMETHAMINE 30 MG/ML IJ SOLN: 30.0000 mg | Freq: Once | INTRAMUSCULAR | Status: DC

## 2017-01-31 MED ORDER — PROMETHAZINE HCL 25 MG PO TABS: 25.0000 mg | ORAL_TABLET | Freq: Four times a day (QID) | ORAL | 1 refills | Status: DC | PRN

## 2017-01-31 MED ORDER — KETOROLAC TROMETHAMINE 30 MG/ML IJ SOLN
30.0000 mg | Freq: Once | INTRAMUSCULAR | Status: AC
  Administered 2017-01-31: 30 mg via INTRAMUSCULAR
  Filled 2017-01-31: qty 1

## 2017-01-31 NOTE — MAU Note (Signed)
Pt seen @ [facility] yesterday for RUQ pain & lower pain, given prescriptions.  Lower abd pain is worse today, continues to have RUQ pain & back pain.  Denies vaginal bleeding, has scant discharge.  Has constant nausea, vomits every time she eats - has been going on since her bariatric surgery in March, is worse now.  Denies diarrhea.  Has noted blood in her stool.

## 2017-01-31 NOTE — Discharge Instructions (Signed)
Bariatric Surgery, Care After Refer to this sheet in the next few weeks. These instructions provide you with information on caring for yourself after your procedure. Your health care provider may also give you more specific instructions. Your treatment has been planned according to current medical practices, but problems sometimes occur. Call your health care provider if you have any problems or questions after your procedure. What can I expect after the procedure? After your procedure, it is typical to have the following sensations:  Soreness.  Sluggishness.  Tiredness.  Moodiness.  Chilliness. It is not unusual to have dry skin and some hair loss after bariatric surgery. Follow these instructions at home:  Do not drink alcohol, use public transportation, or sign important papers for at least 1 day following surgery.  Do not resume physical activities or drive until directed by your surgeon.  Avoid lifting anything over 10 pounds (4.5 kg) for 6 weeks following surgery, or until approved by your surgeon.  Only take over-the-counter or prescription medicines for pain, discomfort, or fever as directed by your surgeon.  Resume your diet as directed by your surgeon. You will resume eating with liquids and pureed foods, then soft foods, and progress to a more normal diet over time. Follow your surgeon or dietitians guidelines for what and how much to eat and drink. You will need to eat slowly, so as not to cause discomfort and vomiting.  Use showers for bathing as directed by your surgeon.  Change dressings as directed by your surgeon.  Schedule a follow-up appointment with your surgeon as directed. Where to find more information: American Society for Bariatric Surgery: www.asbs.org Weight-control Information Network (WIN): win.AmenCredit.is Contact a health care provider if:  There is redness, swelling, or increasing pain in the wound.  There is pus coming from the wound.  There  is drainage from the wound lasting longer than 1 day.  An unexplained oral temperature above 102F (38.9C) develops.  You notice a foul smell coming from the wound or dressing.  There is a breaking open of the wound (edges not staying together) after stitches have been removed.  You notice increasing pain in the shoulder-strap areas.  You develop episodes of dizziness or faint while standing.  You develop shortness of breath.  You develop persistent nausea or vomiting.  Your soreness seems to be getting worse rather than better. Get help right away if:  You develop a rash.  You have difficulty breathing.  You develop, or feel you are developing, any reaction or side effects to medicines. This information is not intended to replace advice given to you by your health care provider. Make sure you discuss any questions you have with your health care provider. Document Released: 09/14/2005 Document Revised: 02/26/2016 Document Reviewed: 03/15/2013 Elsevier Interactive Patient Education  2017 [employer].

## 2017-01-31 NOTE — MAU Provider Note (Signed)
Patient [name] is a 41 year old non-pregnant female here with multiple complaints of suprapubic pain that comes and goes across her suprapubic region and  right upper gastric pain. She was seen in [facility] ED yesterday for right upper gastric pain; she was told she had ulcers and was given medication for ulcers. CT scan yesterday was negative for obstructions or masses.    She feels like the ulcer medication is not working. She has a significant history of Gastric bypass surgery x 2 (most recent was March 2018) and  colitis.  She has a follow up appointment with her bariatric doctor on June 1, and does not have an appointment  With her GI doctor anytime soon.   Patient states that she always has chronic constipation and that her last bowel movement was yesterday. The consistency was normal for her.  History     CSN: [id number]  Arrival date and time: 01/31/17 1125   First Provider Initiated Contact with Patient 01/31/17 1220      Chief Complaint  Patient presents with  . Abdominal Pain  . Emesis   Abdominal Pain  This is a new problem. The current episode started yesterday. The onset quality is sudden. The problem occurs intermittently. The problem has been unchanged. The pain is located in the suprapubic region. The pain is at a severity of 8/10. The quality of the pain is burning. The abdominal pain does not radiate. Pertinent negatives include no dysuria. The pain is aggravated by certain positions. The pain is relieved by certain positions.    OB History    No data available      Past Medical History:  Diagnosis Date  . Anal fissure - posterior 10/16/2014  . Anxiety    doesn't take anything  . Asthma    has Albuterol inhaler as needed  . Bronchitis   . Chronic headache disorder 07/22/2016  . Clostridium difficile infection 04/20/2012  . Colitis   . Dehydration   . Depression    doesn't take any meds  . Family history of adverse reaction to anesthesia    pt mom gets sick   . GERD (gastroesophageal reflux disease)    takes Pantoprazole daily  . Headache   . Hiatal hernia   . History of blood transfusion    last transfusion was 04/04/2016=Benadryl was given d/t itching. States she always itches with transfusion.   Marland Kitchen History of bronchitis    > 2 yrs ago  . History of colon polyps    benign  . History of migraine    last one 05/01/16  . History of urinary tract infection   . IDA (iron deficiency anemia)   . Internal and external bleeding hemorrhoids 06/11/2014  . Joint pain   . Joint swelling   . Left sided chronic colitis - segmental 06/11/2014  . Migraine   . Motion sickness   . Nausea    takes Zofran as needed  . Obesity   . Oligouria   . Osteoarthritis    back  . Pneumonia    hx of-yrs ago  . Postoperative nausea and vomiting 01/21/2016  . TOBACCO USER 10/02/2009   Qualifier: Diagnosis of  By: Dimas Millin MD, Ellard Artis    . Transfusion history    last transfusion 6'16   . UC (ulcerative colitis) ([place])    supposed to be taking Lialda and Bentyl but has been off since gastric sleeve  . Ulcerative colitis ([place])   . Vertigo    doesn't  take any meds    Past Surgical History:  Procedure Laterality Date  . COLONOSCOPY  2007   for rectal bleeding; [facility]  . COLONOSCOPY N/A 06/11/2014   Procedure: COLONOSCOPY;  Surgeon: Gatha Mayer, MD;  Location: [facility];  Service: Endoscopy;  Laterality: N/A;  . DILATION AND CURETTAGE OF UTERUS    . ESOPHAGOGASTRODUODENOSCOPY (EGD) WITH PROPOFOL N/A 04/06/2016   Procedure: ESOPHAGOGASTRODUODENOSCOPY (EGD) WITH PROPOFOL;  Surgeon: Alphonsa Overall, MD;  Location: [facility];  Service: General;  Laterality: N/A;  . FOOT SURGERY Bilateral    x 2  . GASTRIC ROUX-EN-Y N/A 12/14/2016   Procedure: LAPAROSCOPIC REVISION SLEEVE GASTRECTOMY TO  ROUX-Y-GASTRIC BY-PASS, UPPER ENDO;  Surgeon: Excell Seltzer, MD;  Location: [facility];  Service: General;  Laterality: N/A;  . GASTROJEJUNOSTOMY N/A 05/11/2016   Procedure: LAPAROSCOPIC  PLACEMENT  OF FEEDING  JEJUNOSTOMY TUBE;  Surgeon: Excell Seltzer, MD;  Location: [facility];  Service: General;  Laterality: N/A;  . HEMORRHOIDECTOMY WITH HEMORRHOID BANDING    . IR GENERIC HISTORICAL  05/19/2016   IR CM INJ ANY COLONIC TUBE W/FLUORO 05/19/2016 Aletta Edouard, MD [facility]  . KNEE ARTHROSCOPY Left 09/06/2014  . LAPAROSCOPIC GASTRIC SLEEVE RESECTION N/A 01/14/2016   Procedure: LAPAROSCOPIC GASTRIC SLEEVE RESECTION;  Surgeon: Excell Seltzer, MD;  Location: [facility];  Service: General;  Laterality: N/A;  . LAPAROSCOPIC TUBAL LIGATION  10/16/2011   Procedure: LAPAROSCOPIC TUBAL LIGATION;  Surgeon: Alwyn Pea, MD;  Location: [facility];  Service: Gynecology;  Laterality: Bilateral;  . NOVASURE ABLATION  09/28/2010   mild persistent vaginal bleeding  . right knee arthroscopy     05/07/2016  . TUBAL LIGATION    . wisdom teeth extracted      Family History  Problem Relation Age of Onset  . Hypertension Mother   . Diabetes Mother   . Sarcoidosis Mother     lungs and skin  . Asthma Mother   . Hypertension Father   . Diabetes Father   . Asthma Son   . Tics Son   . Asthma Sister   . Cancer Sister     possible pancreatic cancer  . Adrenal disorder Sister     Tumor   . Asthma Brother   . Asthma Daughter     died age 90.5  . Cancer Daughter 4    brain; died age 90.5  . Asthma Son   . Cancer Paternal Aunt     brain, colon, lung and esophagus; unsure of primary     Social History  Substance Use Topics  . Smoking status: Former Smoker    Packs/day: 0.50    Years: 20.00    Types: Cigarettes    Quit date: 03/17/2014  . Smokeless tobacco: Never Used  . Alcohol use No    Allergies:  Allergies  Allergen Reactions  . Food Anaphylaxis, Itching and Other (See Comments)    Pt is allergic to coconut.   . Morphine And Related Anaphylaxis and Other (See Comments)    Pt states that she has tolerated Norco and Dilaudid.    . Oxycodone Anaphylaxis  . Ciprofloxacin Itching  and Rash  . Doxycycline Nausea And Vomiting  . Adhesive [Tape] Rash  . Penicillins Rash and Other (See Comments)    Has patient had a PCN reaction causing immediate rash, facial/tongue/throat swelling, SOB or lightheadedness with hypotension: No Has patient had a PCN reaction causing severe rash involving mucus membranes or skin necrosis: No Has patient had a PCN reaction  that required hospitalization No Has patient had a PCN reaction occurring within the last 10 years: No If all of the above answers are "NO", then may proceed with Cephalosporin use.    Facility-Administered Medications Prior to Admission  Medication Dose Route Frequency Provider Last Rate Last Dose  . cyanocobalamin ((VITAMIN B-12)) injection 1,000 mcg  1,000 mcg Intramuscular Q30 days Scot Jun, FNP   1,000 mcg at 08/26/16 0849  . cyanocobalamin ((VITAMIN B-12)) injection 1,000 mcg  1,000 mcg Intramuscular Q30 days Alveda Reasons, MD   1,000 mcg at 01/20/17 9371   Prescriptions Prior to Admission  Medication Sig Dispense Refill Last Dose  . albuterol (PROVENTIL HFA;VENTOLIN HFA) 108 (90 Base) MCG/ACT inhaler Inhale 2 puffs into the lungs every 6 (six) hours as needed for wheezing or shortness of breath. For shortness of breath 54 g 0 Past Week at Unknown time  . calcium citrate-vitamin D (CELEBRATE CALCIUM CITRATE) 500-500 MG-UNIT chewable tablet Chew 1 tablet by mouth 2 (two) times daily.    Past Week at Unknown time  . Calcium-Vitamin A-Vitamin D (LIQUID CALCIUM PO) Take 500 mg by mouth 3 (three) times daily.   Past Week at Unknown time  . cyanocobalamin (,VITAMIN B-12,) 1000 MCG/ML injection Inject 1,000 mcg into the muscle every 30 (thirty) days.   01/20/2017 at unknown  . diclofenac sodium (VOLTAREN) 1 % GEL Apply 2 g topically 4 (four) times daily as needed (for knee pain).    Past Month at Unknown time  . EPINEPHrine (EPIPEN 2-PAK) 0.3 mg/0.3 mL IJ SOAJ injection Inject 0.3 mg into the muscle once as needed  (for severe allergic reaction).   Past Month at Unknown time  . famotidine (PEPCID) 20 MG tablet Take 1 tablet (20 mg total) by mouth 2 (two) times daily. 30 tablet 0 01/31/2017 at Unknown time  . fluticasone (FLONASE) 50 MCG/ACT nasal spray Place 1-2 sprays into both nostrils daily as needed for rhinitis. Reported on 04/01/2016   Past Week at Unknown time  . HYDROcodone-acetaminophen (HYCET) 7.5-325 mg/15 ml solution Take 10 mLs by mouth every 6 (six) hours as needed for moderate pain. 120 mL 0 Past Week at Unknown time  . ondansetron (ZOFRAN-ODT) 8 MG disintegrating tablet Take 1 tablet (8 mg total) by mouth every 8 (eight) hours as needed for nausea. 20 tablet 3 01/30/2017 at Unknown time  . pantoprazole (PROTONIX) 40 MG tablet Take 40 mg by mouth 2 (two) times daily.    01/31/2017 at Unknown time  . sucralfate (CARAFATE) 1 g tablet Take 1 tablet (1 g total) by mouth 4 (four) times daily. 30 tablet 0 01/31/2017 at Unknown time  . thiamine (VITAMIN B-1) 100 MG tablet Take 100 mg by mouth daily.    Past Week at Unknown time    Review of Systems  Respiratory: Negative.   Cardiovascular:       She has heartburn pain that doesn't go away. She has had that for 2 months.   Gastrointestinal: Positive for abdominal pain.  Genitourinary: Positive for urgency. Negative for dysuria.       Feels some relief when she urinates and she always feels like she has to go to the bathroom  Psychiatric/Behavioral: Negative.    Physical Exam   Blood pressure 108/72, pulse 70, temperature 97.8 F (36.6 C), temperature source Oral, resp. rate 18, last menstrual period 01/20/2017.  Physical Exam  Constitutional: She is oriented to person, place, and time. She appears well-developed.  HENT:  Head: Normocephalic.  Neck: Normal range of motion.  GI: Soft. She exhibits no mass. There is tenderness. There is no rebound and no guarding.  Bowel sounds auscultated in all 4 quadrants  Musculoskeletal: Normal range of motion.   Neurological: She is alert and oriented to person, place, and time.  Skin: Skin is warm and dry.  Psychiatric: She has a normal mood and affect.    MAU Course  Procedures  MDM -30 mg of toradol IM (despite possible ulcer aggravation): patient states her pain is now a 5/10; was observed sleeping in bed.  -UC pending -reviewed labs and CT scan from yesterday's ED visit; labs and CT scans not repeated today  Assessment and Plan   1. Lower abdominal pain    2. Patient stable for discharge with recommendations to follow her bariatric surgery food regimen; try tylenol and heat for pain relief.  3. Recommended that patient call her primary GI doctor and her bariatric doctor and see if she can get an appointment for follow-up and endoscopy.  4. Continue to take her ulcer medications as well as other GI meds. discouraged use of NSAIDs due to Gi effects. RX for phenergan sent per patient request for nausea-related to surgery.  5. Message sent to the clinic for follow up appointment with MD and outpatient GYN [place] to discuss her complaints of menorrhagia and possible hysterectomy.  Woodward CNM 01/31/2017, 12:23 PM

## 2017-02-01 LAB — URINE CULTURE: Culture: <10000 — AB

## 2017-02-03 ENCOUNTER — Telehealth: Payer: Self-pay | Admitting: *Deleted

## 2017-02-03 DIAGNOSIS — N920 Excessive and frequent menstruation with regular cycle: Secondary | ICD-10-CM

## 2017-02-03 DIAGNOSIS — R102 Pelvic and perineal pain: Secondary | ICD-10-CM

## 2017-02-03 NOTE — Telephone Encounter (Signed)
Called pt and informed her of follow up needed after her recent visit.  [place] has been scheduled on 5/15 @ 1300, arrive @ 1245 with a full bladder. She will be contacted with information of follow up appt in office with a provider to discuss [place] results and next steps for care. Pt voiced understanding.

## 2017-02-09 ENCOUNTER — Other Ambulatory Visit (HOSPITAL_COMMUNITY): Payer: Self-pay | Admitting: General Surgery

## 2017-02-09 ENCOUNTER — Ambulatory Visit (HOSPITAL_COMMUNITY)
Admission: RE | Admit: 2017-02-09 | Discharge: 2017-02-09 | Disposition: A | Discharge status: Home / Self Care | Payer: 59 | Source: Ambulatory Visit | Attending: Student | Admitting: Student

## 2017-02-09 DIAGNOSIS — R102 Pelvic and perineal pain: Secondary | ICD-10-CM | POA: Diagnosis not present

## 2017-02-09 DIAGNOSIS — R1030 Lower abdominal pain, unspecified: Secondary | ICD-10-CM

## 2017-02-09 DIAGNOSIS — N920 Excessive and frequent menstruation with regular cycle: Secondary | ICD-10-CM | POA: Diagnosis not present

## 2017-02-09 DIAGNOSIS — Z9884 Bariatric surgery status: Secondary | ICD-10-CM

## 2017-02-10 ENCOUNTER — Ambulatory Visit (INDEPENDENT_AMBULATORY_CARE_PROVIDER_SITE_OTHER): Payer: 59 | Admitting: Neurology

## 2017-02-10 ENCOUNTER — Encounter: Payer: Self-pay | Admitting: Neurology

## 2017-02-10 ENCOUNTER — Ambulatory Visit (HOSPITAL_COMMUNITY)
Admission: RE | Admit: 2017-02-10 | Discharge: 2017-02-10 | Disposition: A | Discharge status: Home / Self Care | Payer: 59 | Source: Ambulatory Visit | Attending: General Surgery | Admitting: General Surgery

## 2017-02-10 VITALS — BP 102/75 | HR 83 | Wt 179.0 lb

## 2017-02-10 DIAGNOSIS — R202 Paresthesia of skin: Secondary | ICD-10-CM | POA: Diagnosis not present

## 2017-02-10 DIAGNOSIS — K449 Diaphragmatic hernia without obstruction or gangrene: Secondary | ICD-10-CM | POA: Insufficient documentation

## 2017-02-10 DIAGNOSIS — R413 Other amnesia: Secondary | ICD-10-CM

## 2017-02-10 DIAGNOSIS — Z9884 Bariatric surgery status: Secondary | ICD-10-CM | POA: Diagnosis not present

## 2017-02-10 DIAGNOSIS — K469 Unspecified abdominal hernia without obstruction or gangrene: Secondary | ICD-10-CM | POA: Diagnosis not present

## 2017-02-10 NOTE — Progress Notes (Signed)
Reason for visit: Paresthesias  [name] is an 41 y.o. female  History of present illness:  [name] is a 41 year old right-handed black female with a history of obesity who underwent bariatric surgery. The patient has lost a significant amount of weight which still continues. The patient went from about 320 pounds to her current weight of 179 pounds. The patient has lost 37 pounds since last seen through this office in October 2017. The patient has some dizziness when she stands up quickly. The patient has a sensation of feeling heavy with the right arm. She has some discomfort in the abdomen with pain referred to the right shoulder. The patient chronic nausea, she can only eat certain foods that are liquid. The patient has some underlying memory issues. The patient has undergone MRI evaluation of the brain and cervical spine that were unremarkable. EMG and nerve conduction study showed evidence of a mild peripheral neuropathy. Blood work evaluation has been unremarkable. The patient denies any significant issues with balance, she reports no falls. She is not sleeping well at night in part because of the pain and nausea. The patient feels bad all the time. She returns for an evaluation.  Past Medical History:  Diagnosis Date  . Anal fissure - posterior 10/16/2014  . Anxiety    doesn't take anything  . Asthma    has Albuterol inhaler as needed  . Bronchitis   . Chronic headache disorder 07/22/2016  . Clostridium difficile infection 04/20/2012  . Colitis   . Dehydration   . Depression    doesn't take any meds  . Family history of adverse reaction to anesthesia    pt mom gets sick  . GERD (gastroesophageal reflux disease)    takes Pantoprazole daily  . Headache   . Hiatal hernia   . History of blood transfusion    last transfusion was 04/04/2016=Benadryl was given d/t itching. States she always itches with transfusion.   Marland Kitchen History of bronchitis    > 2 yrs ago  . History of  colon polyps    benign  . History of migraine    last one 05/01/16  . History of urinary tract infection   . IDA (iron deficiency anemia)   . Internal and external bleeding hemorrhoids 06/11/2014  . Joint pain   . Joint swelling   . Left sided chronic colitis - segmental 06/11/2014  . Migraine   . Motion sickness   . Nausea    takes Zofran as needed  . Obesity   . Oligouria   . Osteoarthritis    back  . Pneumonia    hx of-yrs ago  . Postoperative nausea and vomiting 01/21/2016  . TOBACCO USER 10/02/2009   Qualifier: Diagnosis of  By: Dimas Millin MD, Ellard Artis    . Transfusion history    last transfusion 6'16   . UC (ulcerative colitis) ([place])    supposed to be taking Lialda and Bentyl but has been off since gastric sleeve  . Ulcerative colitis ([place])   . Vertigo    doesn't take any meds    Past Surgical History:  Procedure Laterality Date  . COLONOSCOPY  2007   for rectal bleeding; [facility]  . COLONOSCOPY N/A 06/11/2014   Procedure: COLONOSCOPY;  Surgeon: Gatha Mayer, MD;  Location: [facility];  Service: Endoscopy;  Laterality: N/A;  . DILATION AND CURETTAGE OF UTERUS    . ESOPHAGOGASTRODUODENOSCOPY (EGD) WITH PROPOFOL N/A 04/06/2016   Procedure: ESOPHAGOGASTRODUODENOSCOPY (EGD) WITH PROPOFOL;  Surgeon: Alphonsa Overall, MD;  Location: [facility];  Service: General;  Laterality: N/A;  . FOOT SURGERY Bilateral    x 2  . GASTRIC ROUX-EN-Y N/A 12/14/2016   Procedure: LAPAROSCOPIC REVISION SLEEVE GASTRECTOMY TO  ROUX-Y-GASTRIC BY-PASS, UPPER ENDO;  Surgeon: Excell Seltzer, MD;  Location: [facility];  Service: General;  Laterality: N/A;  . GASTROJEJUNOSTOMY N/A 05/11/2016   Procedure: LAPAROSCOPIC PLACEMENT  OF FEEDING  JEJUNOSTOMY TUBE;  Surgeon: Excell Seltzer, MD;  Location: [facility];  Service: General;  Laterality: N/A;  . HEMORRHOIDECTOMY WITH HEMORRHOID BANDING    . IR GENERIC HISTORICAL  05/19/2016   IR CM INJ ANY COLONIC TUBE W/FLUORO 05/19/2016 Aletta Edouard, MD [facility]  . KNEE  ARTHROSCOPY Left 09/06/2014  . LAPAROSCOPIC GASTRIC SLEEVE RESECTION N/A 01/14/2016   Procedure: LAPAROSCOPIC GASTRIC SLEEVE RESECTION;  Surgeon: Excell Seltzer, MD;  Location: [facility];  Service: General;  Laterality: N/A;  . LAPAROSCOPIC TUBAL LIGATION  10/16/2011   Procedure: LAPAROSCOPIC TUBAL LIGATION;  Surgeon: Alwyn Pea, MD;  Location: [facility];  Service: Gynecology;  Laterality: Bilateral;  . NOVASURE ABLATION  09/28/2010   mild persistent vaginal bleeding  . right knee arthroscopy     05/07/2016  . TUBAL LIGATION    . wisdom teeth extracted      Family History  Problem Relation Age of Onset  . Hypertension Mother   . Diabetes Mother   . Sarcoidosis Mother        lungs and skin  . Asthma Mother   . Hypertension Father   . Diabetes Father   . Asthma Son   . Tics Son   . Asthma Sister   . Cancer Sister        possible pancreatic cancer  . Adrenal disorder Sister        Tumor   . Asthma Brother   . Asthma Daughter        died age 718.5  . Cancer Daughter 4       brain; died age 718.5  . Asthma Son   . Cancer Paternal Aunt        brain, colon, lung and esophagus; unsure of primary     Social history:  reports that she quit smoking about 2 years ago. Her smoking use included Cigarettes. She has a 10.00 pack-year smoking history. She has never used smokeless tobacco. She reports that she does not drink alcohol or use drugs.    Allergies  Allergen Reactions  . Food Anaphylaxis, Itching and Other (See Comments)    Pt is allergic to coconut.   . Morphine And Related Anaphylaxis and Other (See Comments)    Pt states that she has tolerated Norco and Dilaudid.  Has rash  . Oxycodone Anaphylaxis  . Ciprofloxacin Itching and Rash  . Doxycycline Nausea And Vomiting  . Adhesive [Tape] Rash  . Penicillins Rash and Other (See Comments)    Has patient had a PCN reaction causing immediate rash, facial/tongue/throat swelling, SOB or lightheadedness with hypotension: No Has  patient had a PCN reaction causing severe rash involving mucus membranes or skin necrosis: No Has patient had a PCN reaction that required hospitalization No Has patient had a PCN reaction occurring within the last 10 years: No If all of the above answers are "NO", then may proceed with Cephalosporin use.    Medications:  Prior to Admission medications   Medication Sig Start Date End Date Taking? Authorizing Provider  albuterol (PROVENTIL HFA;VENTOLIN HFA) 108 (90 Base) MCG/ACT inhaler  Inhale 2 puffs into the lungs every 6 (six) hours as needed for wheezing or shortness of breath. For shortness of breath 09/22/16  Yes Wardell Honour, MD  Calcium-Vitamin A-Vitamin D (LIQUID CALCIUM PO) Take 500 mg by mouth 3 (three) times daily.   Yes [provider]  cyanocobalamin (,VITAMIN B-12,) 1000 MCG/ML injection Inject 1,000 mcg into the muscle every 30 (thirty) days.   Yes [provider]  diclofenac sodium (VOLTAREN) 1 % GEL Apply 2 g topically 4 (four) times daily as needed (for knee pain).    Yes [provider]  EPINEPHrine (EPIPEN 2-PAK) 0.3 mg/0.3 mL IJ SOAJ injection Inject 0.3 mg into the muscle once as needed (for severe allergic reaction).   Yes [provider]  famotidine (PEPCID) 20 MG tablet Take 1 tablet (20 mg total) by mouth 2 (two) times daily. 01/30/17  Yes Lacretia Leigh, MD  fluticasone [facility]) 50 MCG/ACT nasal spray Place 1-2 sprays into both nostrils daily as needed for rhinitis. Reported on 04/01/2016   Yes [provider]  Multiple Vitamins-Minerals (MULTIVITAMIN ADULT PO) Take by mouth 2 (two) times daily.    Yes [provider]  ondansetron (ZOFRAN-ODT) 8 MG disintegrating tablet Take 1 tablet (8 mg total) by mouth every 8 (eight) hours as needed for nausea. 03/25/16  Yes Wardell Honour, MD  pantoprazole (PROTONIX) 40 MG tablet Take 40 mg by mouth 2 (two) times daily.    Yes [provider]  promethazine (PHENERGAN) 25  MG tablet Take 1 tablet (25 mg total) by mouth every 6 (six) hours as needed for nausea or vomiting. 01/31/17  Yes Starr Lake, CNM  sucralfate (CARAFATE) 1 g tablet Take 1 tablet (1 g total) by mouth 4 (four) times daily. 01/30/17  Yes Lacretia Leigh, MD  thiamine (VITAMIN B-1) 100 MG tablet Take 100 mg by mouth daily.    Yes [provider]    ROS:  Out of a complete 14 system review of symptoms, the patient complains only of the following symptoms, and all other reviewed systems are negative.  Decreased activity, decreased appetite, fatigue Ear pain Eye discomfort Shortness of breath Chest pain, palpitations of the heart Abdominal pain, rectal bleeding, nausea, vomiting Restless legs, snoring Environmental allergies, food allergies Joint pain, back pain, neck pain Skin rash, moles Bruising easily, anemia Dizziness, headache, numbness, weakness Depression, anxiety  Blood pressure 102/75, pulse 83, weight 179 lb (81.2 kg), last menstrual period 02/10/2017.  Physical Exam  General: The patient is alert and cooperative at the time of the examination.  Skin: No significant peripheral edema is noted.   Neurologic Exam  Mental status: The patient is alert and oriented x 3 at the time of the examination. The patient has apparent normal recent and remote memory, with an apparently normal attention span and concentration ability.   Cranial nerves: Facial symmetry is present. Speech is normal, no aphasia or dysarthria is noted. Extraocular movements are full. Visual fields are full.  Motor: The patient has good strength in all 4 extremities.  Sensory examination: Soft touch sensation is symmetric on the face, arms, and legs.  Coordination: The patient has good finger-nose-finger and heel-to-shin bilaterally.  Gait and station: The patient has a normal gait. Tandem gait is normal. Romberg is negative. No drift is seen.  Reflexes: Deep tendon reflexes are  symmetric.   MRI brain 08/07/16:  IMPRESSION: This is a normal MRI of the brain with and without contrast.   MRI cervical 08/07/16:  IMPRESSION: This is a normal MRI of the cervical spine with and without contrast.   Assessment/Plan:  1. Mild peripheral neuropathy  2. Status post bariatric surgery, chronic nausea and ongoing weight loss  The patient feels poorly, she has never regained her appetite and she is unable to maintain a good nutritional status and maintain hydration. Even drinking water results in significant nausea. The neurologic workup showed only a mild peripheral neuropathy, there is no evidence of any issues with spinal cord compression or demyelinating disease. Some of the reports of memory problems are likely related to the lack of sleep and chronic fatigue. I am not clear that I have anything to offer her at this point. The patient has had ongoing evaluation for her chronic nausea. She will follow-up through this office if needed.  Jill Alexanders MD 02/10/2017 12:05 PM  [facility] [address] [place], [place] [address]  Phone [phone] Fax [phone]

## 2017-02-17 ENCOUNTER — Encounter: Payer: 59 | Attending: Family Medicine | Admitting: Skilled Nursing Facility1

## 2017-02-17 DIAGNOSIS — E669 Obesity, unspecified: Secondary | ICD-10-CM | POA: Insufficient documentation

## 2017-02-17 DIAGNOSIS — Z9884 Bariatric surgery status: Secondary | ICD-10-CM | POA: Insufficient documentation

## 2017-02-17 DIAGNOSIS — Z713 Dietary counseling and surveillance: Secondary | ICD-10-CM | POA: Insufficient documentation

## 2017-02-17 DIAGNOSIS — Z6830 Body mass index (BMI) 30.0-30.9, adult: Secondary | ICD-10-CM | POA: Insufficient documentation

## 2017-02-17 NOTE — Progress Notes (Signed)
Post Op appt: Pt states she Does not like to leave the house due to self image/depression. Pt states that she speed walks during commercial breaks and when walking from car to buildings for appts. Pt states that she lays and sleeps 16 hours during the day and waking at 1am to help manage nausea. Pt states she has severe back pain constantly, a soreness with shoulder pain. Pt states she Gets 2 full protein shakes, throwing up the protein shake because it is gross, feels tickle in my throat thinks maybe this is due to not being use to po status, had a cookie with nausea, eggs: made nauseas with bloat, burping a lot, meat makes her sick, chew and spit for the flavor, anything out of a can tastes like metal, bouillon cube is gross, unjury chicken soup is gross, water will cause vomiting but ice chips are okay, using a water bottle, scared to be sick due to cousin passing away in Nov. 7116 from complications related to gastric bypass surgery. Pt states "Everything I don't like makes me sick so I expect to be sick." Pt states yogurt does not work for her. Pt states the Sugar free coffee from [employer] she enjoys, able to handle 2 Lays chips on her tongue and lets them disintegrate, takes liquid multivitamin. 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 Pt states she used to see a therapist in [place] but has not recently worked with anyone. Pt states she likes Dr. Margarita Grizzle and has been meaning to call him for support but cannot bring herself to do it. Dietitian advised she contact Dr. Laurie Panda office directly after the appointment.  Pts labs:hemoglobin 11; HCT 34.1.   2 months after surgery: Pt arrives stating she feels like she is going to throw up-due to the smell of coffee and rocked back and forth over the trash can. Dietitian  asked if she needed to go to the hospital-pt declined. Pt states she stays nauseous all day. Pt states she got a upper GI last week-unremarkable-states she will be getting an endoscopy soon and was told she may have ulcers. Pt states she has been to the hospital twice for severe cramping lower abdominal area states she has constant pain in her upper abdominal area. Pt states her back is killing her. Pt states she sees her psychologist every week. Pt states she feels exhausted all day every day. Pt answered her phone during the appointment. Pt states she has to sit still in order to tolerate taking medicine. Pt stats she gets nauseas just from cooking foods. Pt states Putting the oil on her lip does not work anymore. Pt states she chews and spits a lot. Pt states she Was told it is not in her head, it is something wrong in her stomach by her psychologist. Pt states she is lightheaded and dizzy with muffled hearing often. Pt states she is bruising easily. Pt sates she often has bloody stools and sometimes she only defecates blood.  Pt is adament her current issues with tolerating food are not linked to her hx of eating disorder. Pt states she wants to gain weight never thought she would be this skinny. Pt states she would much rather have a feeding tube than feel the way she currently does.  Dietitian attempted to  preform the nutrition focused physical exam: wasting was not noted in her scapula or chest. However she has diminished hand strength with shaky when attempting to press firmly against dietitians palm. Pt states cold liquids cause cramping and nausea.   Pt states in the last 2 weeks she has attempted: [place]: threw up Nabisco crackers: threw up Ziggis yogurt: threw up Protein shakes: threw up Salmon patty: threw up  02/17/2017   BMI (kg/m^2) 28.1   Fat Mass (lbs) 71.8   Fat Free Mass (lbs) 105.2   Total Body Water (lbs) 75   Surgery date: 12/14/2016 Surgery type: RYGB [facility] weight at [facility]:  230 Weight today: 195.8 Weight change: 35  TANITA  BODY COMP RESULTS  12/29/2016   BMI (kg/m^2) 31.1   Fat Mass (lbs) 86.4   Fat Free Mass (lbs) 109.4   Total Body Water (lbs) 78.8    24-hr recall: for the past 2 weeks going on 3 weeks B (AM): vitamins, protonix, pepcid hour later carafate with sips of water Snk (AM): nausea medicine with sips of water L (PM):  Snk (PM):  D (PM):  Snk (PM):   Fluid intake: "sometimes sugar free coffee to make me poop" Estimated total protein intake: insufficient   Medications: See List Supplementation: sometimes without throwing up Recent physical activity:  ADL's   Dietitian advised she get a iron supplement due to her levels being low and the amount of lost blood. Dietitian advsied she continue to introduce foods and attempt to find something she can tolerate as well as sipping on fluids throughout the day

## 2017-02-19 ENCOUNTER — Inpatient Hospital Stay: Payer: 59 | Admitting: Family Medicine

## 2017-02-19 DIAGNOSIS — R112 Nausea with vomiting, unspecified: Secondary | ICD-10-CM | POA: Diagnosis not present

## 2017-02-23 ENCOUNTER — Ambulatory Visit (INDEPENDENT_AMBULATORY_CARE_PROVIDER_SITE_OTHER): Payer: 59 | Admitting: Family Medicine

## 2017-02-23 ENCOUNTER — Encounter: Payer: Self-pay | Admitting: Family Medicine

## 2017-02-23 VITALS — BP 104/70 | HR 60 | Temp 98.0°F | Resp 18 | Ht 67.0 in | Wt 176.0 lb

## 2017-02-23 DIAGNOSIS — R1011 Right upper quadrant pain: Secondary | ICD-10-CM

## 2017-02-23 DIAGNOSIS — Z9884 Bariatric surgery status: Secondary | ICD-10-CM

## 2017-02-23 DIAGNOSIS — R112 Nausea with vomiting, unspecified: Secondary | ICD-10-CM

## 2017-02-23 DIAGNOSIS — F329 Major depressive disorder, single episode, unspecified: Secondary | ICD-10-CM | POA: Diagnosis not present

## 2017-02-23 DIAGNOSIS — F5104 Psychophysiologic insomnia: Secondary | ICD-10-CM

## 2017-02-23 DIAGNOSIS — F419 Anxiety disorder, unspecified: Secondary | ICD-10-CM

## 2017-02-23 DIAGNOSIS — E538 Deficiency of other specified B group vitamins: Secondary | ICD-10-CM

## 2017-02-23 DIAGNOSIS — Z9889 Other specified postprocedural states: Secondary | ICD-10-CM | POA: Diagnosis not present

## 2017-02-23 DIAGNOSIS — F32A Depression, unspecified: Secondary | ICD-10-CM

## 2017-02-23 MED ORDER — MIRTAZAPINE 15 MG PO TBDP: 15.0000 mg | ORAL_TABLET | Freq: Every day | ORAL | 3 refills | Status: DC

## 2017-02-23 MED ORDER — CYANOCOBALAMIN 1000 MCG/ML IJ SOLN
1000.0000 ug | INTRAMUSCULAR | Status: DC
  Administered 2017-02-23 – 2018-04-22 (×4): 1000 ug via INTRAMUSCULAR

## 2017-02-23 NOTE — Patient Instructions (Signed)
     IF you received an x-ray today, you will receive an invoice from [facility]. Please contact [facility] at [phone] with questions or concerns regarding your invoice.   IF you received labwork today, you will receive an invoice from [facility]. Please contact [facility] at [phone] with questions or concerns regarding your invoice.   Our billing staff will not be able to assist you with questions regarding bills from these companies.  You will be contacted with the lab results as soon as they are available. The fastest way to get your results is to activate your My Chart account. Instructions are located on the last page of this paperwork. If you have not heard from us regarding the results in 2 weeks, please contact this office.     

## 2017-02-23 NOTE — Progress Notes (Signed)
Subjective:    Patient ID: [name], female    DOB: 01-07-1976, 41 y.o.   MRN: [id number]  02/23/2017  Follow-up (hospital )   HPI This 41 y.o. female presents for evaluation of recent ED evaluation for RUQ pain.  S/p CT abd/pelvis in ED; negative.  S/p gynecological evaluation; s/p pelvic US; follow-up tomorrow.  Abdominal pain is much improved from ED visit.  Also seen at [facility] hospital.  Does not take pain medication.  Dr. Carlean Purl did not want to do colonoscopy until nausea has been sorted out.    Scheduled for colonoscopy and EGD by Dr. Lucia Gaskins due to nausea.   S/p UGI +HERD, small HH. Has been taking Carafate, Protonix qhs, Pepcid bid. Starting weight 350; day of surgery 320.    Immunization History  Administered Date(s) Administered  . Influenza,inj,Quad PF,36+ Mos 09/02/2015, 07/21/2016  . Influenza-Unspecified 07/30/2014  . Tdap 05/18/2015   BP Readings from Last 3 Encounters:  03/02/17 119/72  02/25/17 123/73  02/23/17 104/70   Wt Readings from Last 3 Encounters:  03/08/17 175 lb (79.4 kg)  03/02/17 177 lb (80.3 kg)  02/25/17 175 lb 1.6 oz (79.4 kg)    Review of Systems  Constitutional: Negative for chills, diaphoresis, fatigue and fever.  Eyes: Negative for visual disturbance.  Respiratory: Negative for cough and shortness of breath.   Cardiovascular: Negative for chest pain, palpitations and leg swelling.  Gastrointestinal: Positive for abdominal pain and nausea. Negative for abdominal distention, anal bleeding, blood in stool, constipation, diarrhea, rectal pain and vomiting.  Endocrine: Negative for cold intolerance, heat intolerance, polydipsia, polyphagia and polyuria.  Neurological: Negative for dizziness, tremors, seizures, syncope, facial asymmetry, speech difficulty, weakness, light-headedness, numbness and headaches.    Past Medical History:  Diagnosis Date  . Anal fissure - posterior 10/16/2014  . Anxiety    doesn't take anything  . Asthma     has Albuterol inhaler as needed  . Boil    on pubic area started septra 03-01-17 draining blood and pus  . Bronchitis   . Chronic headache disorder 07/22/2016  . Clostridium difficile infection 04/20/2012  . Colitis   . Dehydration   . Depression    doesn't take any meds  . Family history of adverse reaction to anesthesia    pt mom gets sick  . GERD (gastroesophageal reflux disease)    takes Pantoprazole daily  . Headache   . Hiatal hernia   . History of blood transfusion    last transfusion was 04/04/2016=Benadryl was given d/t itching. States she always itches with transfusion.   Marland Kitchen History of bronchitis    > 2 yrs ago  . History of colon polyps    benign  . History of migraine    last one 05/01/16  . History of urinary tract infection   . IDA (iron deficiency anemia)   . Internal and external bleeding hemorrhoids 06/11/2014  . Joint pain   . Joint swelling   . Left sided chronic colitis - segmental 06/11/2014  . Migraine   . Motion sickness   . Nausea    takes Zofran as needed  . Nausea and vomiting    for 1 year  . Obesity   . Oligouria   . Osteoarthritis    back  . Pneumonia 1997  . Postoperative nausea and vomiting 01/21/2016   wants scopolamine patch  . TOBACCO USER 10/02/2009   Qualifier: Diagnosis of  By: Dimas Millin MD, Ellard Artis    . Transfusion history  last transfusion 6'16   . UC (ulcerative colitis) ([place])    supposed to be taking Lialda and Bentyl but has been off since gastric sleeve  . Ulcerative colitis ([place])   . Vertigo    doesn't take any meds   Past Surgical History:  Procedure Laterality Date  . COLONOSCOPY  2007   for rectal bleeding; [facility]  . COLONOSCOPY N/A 06/11/2014   Procedure: COLONOSCOPY;  Surgeon: Gatha Mayer, MD;  Location: [facility];  Service: Endoscopy;  Laterality: N/A;  . COLONOSCOPY WITH PROPOFOL N/A 03/02/2017   Procedure: COLONOSCOPY WITH PROPOFOL;  Surgeon: Alphonsa Overall, MD;  Location: [facility];  Service: General;   Laterality: N/A;  . DILATION AND CURETTAGE OF UTERUS    . ESOPHAGOGASTRODUODENOSCOPY (EGD) WITH PROPOFOL N/A 04/06/2016   Procedure: ESOPHAGOGASTRODUODENOSCOPY (EGD) WITH PROPOFOL;  Surgeon: Alphonsa Overall, MD;  Location: [facility];  Service: General;  Laterality: N/A;  . ESOPHAGOGASTRODUODENOSCOPY (EGD) WITH PROPOFOL N/A 03/02/2017   Procedure: ESOPHAGOGASTRODUODENOSCOPY (EGD) WITH PROPOFOL;  Surgeon: Alphonsa Overall, MD;  Location: [facility];  Service: General;  Laterality: N/A;  . FOOT SURGERY Bilateral    x 2  . GASTRIC ROUX-EN-Y N/A 12/14/2016   Procedure: LAPAROSCOPIC REVISION SLEEVE GASTRECTOMY TO  ROUX-Y-GASTRIC BY-PASS, UPPER ENDO;  Surgeon: Excell Seltzer, MD;  Location: [facility];  Service: General;  Laterality: N/A;  . GASTROJEJUNOSTOMY N/A 05/11/2016   Procedure: LAPAROSCOPIC PLACEMENT  OF FEEDING  JEJUNOSTOMY TUBE;  Surgeon: Excell Seltzer, MD;  Location: [facility];  Service: General;  Laterality: N/A;  . HEMORRHOIDECTOMY WITH HEMORRHOID BANDING    . IR GENERIC HISTORICAL  05/19/2016   IR CM INJ ANY COLONIC TUBE W/FLUORO 05/19/2016 Aletta Edouard, MD [facility]  . j tube removed march 2018    . KNEE ARTHROSCOPY Left 09/06/2014  . LAPAROSCOPIC GASTRIC SLEEVE RESECTION N/A 01/14/2016   Procedure: LAPAROSCOPIC GASTRIC SLEEVE RESECTION;  Surgeon: Excell Seltzer, MD;  Location: [facility];  Service: General;  Laterality: N/A;  . LAPAROSCOPIC TUBAL LIGATION  10/16/2011   Procedure: LAPAROSCOPIC TUBAL LIGATION;  Surgeon: Alwyn Pea, MD;  Location: [facility] [facility];  Service: Gynecology;  Laterality: Bilateral;  . NOVASURE ABLATION  09/28/2010   mild persistent vaginal bleeding  . right knee arthroscopy     05/07/2016  . TUBAL LIGATION    . wisdom teeth extracted     Allergies  Allergen Reactions  . Food Anaphylaxis, Itching and Other (See Comments)    Pt is allergic to coconut.   . Morphine And Related Anaphylaxis and Other (See Comments)    Pt states that she has tolerated Norco and  Dilaudid.  Has rash  . Oxycodone Anaphylaxis  . Ciprofloxacin Itching and Rash  . Doxycycline Nausea And Vomiting  . Adhesive [Tape] Rash    And paper tape causes a rash  . Penicillins Rash and Other (See Comments)    Has patient had a PCN reaction causing immediate rash, facial/tongue/throat swelling, SOB or lightheadedness with hypotension: No Has patient had a PCN reaction causing severe rash involving mucus membranes or skin necrosis: No Has patient had a PCN reaction that required hospitalization No Has patient had a PCN reaction occurring within the last 10 years: No If all of the above answers are "NO", then may proceed with Cephalosporin use.    Social History   Social History  . Marital status: Single    Spouse name: N/A  . Number of children: 3  . Years of education: Some college   Occupational History  .  [employer] and [employer]     Currently out on disability d/t surg   Social History Main Topics  . Smoking status: Former Smoker    Packs/day: 0.50    Years: 20.00    Types: Cigarettes    Quit date: 03/17/2014  . Smokeless tobacco: Never Used  . Alcohol use No  . Drug use: No  . Sexual activity: Not Currently    Birth control/ protection: Surgical   Other Topics Concern  . Not on file   Social History Narrative   Marital status:  Single; not dating seriously.      Children:  3 sons (18, 45, 73) whose father is deceased, 1 daughter (deceased); no grandchildren      Lives:  With 2 sons      Employment:  Full time; Corporate treasurer at [employer]      Tobacco: quit June 2015      Alcohol:  None      Drugs:  None      Exercise:  Leg lifts for knee strengthening; walking   1 caffeine beverage daily      Sexual activity:  Active; total partners = up there.  No STDs in past.        Right-handed      Family History  Problem Relation Age of Onset  . Hypertension Mother   . Diabetes Mother   . Sarcoidosis Mother        lungs and skin  . Asthma Mother   .  Hypertension Father   . Diabetes Father   . Asthma Son   . Tics Son   . Asthma Sister   . Cancer Sister        possible pancreatic cancer  . Adrenal disorder Sister        Tumor   . Asthma Brother   . Asthma Daughter        died age 79.5  . Cancer Daughter 4       brain; died age 79.5  . Asthma Son   . Cancer Paternal Aunt        brain, colon, lung and esophagus; unsure of primary        Objective:    BP 104/70   Pulse 60   Temp 98 F (36.7 C) (Oral)   Resp 18   Ht 5' 7"  (1.702 m)   Wt 176 lb (79.8 kg)   LMP 02/10/2017   SpO2 100%   BMI 27.57 kg/m  Physical Exam  Constitutional: She is oriented to person, place, and time. She appears well-developed and well-nourished. No distress.  HENT:  Head: Normocephalic and atraumatic.  Right Ear: External ear normal.  Left Ear: External ear normal.  Nose: Nose normal.  Mouth/Throat: Oropharynx is clear and moist.  Eyes: Conjunctivae and EOM are normal. Pupils are equal, round, and reactive to light.  Neck: Normal range of motion. Neck supple. Carotid bruit is not present. No thyromegaly present.  Cardiovascular: Normal rate, regular rhythm, normal heart sounds and intact distal pulses.  Exam reveals no gallop and no friction rub.   No murmur heard. Pulmonary/Chest: Effort normal and breath sounds normal. She has no wheezes. She has no rales.  Abdominal: Soft. Bowel sounds are normal. She exhibits no distension and no mass. There is no tenderness. There is no rebound and no guarding.  Lymphadenopathy:    She has no cervical adenopathy.  Neurological: She is alert and oriented to person, place, and time. No cranial nerve  deficit.  Skin: Skin is warm and dry. No rash noted. She is not diaphoretic. No erythema. No pallor.  Psychiatric: She has a normal mood and affect. Her behavior is normal.   Results for orders placed or performed during the hospital encounter of 01/31/17  Culture, Urine  Result Value Ref Range   Specimen  Description URINE, CLEAN CATCH    Special Requests NONE    Culture (A)     <10,000 COLONIES/mL INSIGNIFICANT GROWTH Performed at [facility] [address] [place], [place] [address]    Report Status 02/01/2017 FINAL   Pregnancy, urine POC  Result Value Ref Range   Preg Test, Ur NEGATIVE NEGATIVE       Assessment & Plan:   1. Right upper quadrant abdominal pain   2. Anxiety and depression   3. Vitamin B12 deficiency   4. Postoperative nausea and vomiting   5. S/P laparoscopic sleeve gastrectomy    -new onset RUQ pain warranting ED evaluation; reviewed labs and imaging studies in detail during visit; pain has greatly improved since ED visit. Benign exam in office. -continues to suffer with ongoing nausea since gastric sleeve.  Underwent revision of sleeve gastrectomy to Roux-Y-gastric bypass on 12/14/16 by Dr. Excell Seltzer; nausea has persisted.  Followed closely by nutrition. -scheduled for EGD with colonoscopy this month. 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   Feel suffering with anxiety and depression due to decline in health since gastric bypass.  Rx for Remeron provided to help with insomnia, depression, and appetite.  Will discuss psychotherapy at next visit; pt very negative and focusing on physical appearance/image.   No orders of the defined types were placed in this encounter.  Meds ordered this encounter  Medications  . mirtazapine (REMERON SOL-TAB) 15 MG disintegrating tablet    Sig: Take 1 tablet (15 mg total) by mouth at bedtime.    Dispense:  30 tablet    Refill:  3  . cyanocobalamin ((VITAMIN B-12)) injection 1,000 mcg    Return in about 4 weeks (around 03/23/2017) for recheck.   Kristi Elayne Guerin, M.D. Primary Care at [facility] previously [facility] [address] [place], [place]   [address] [phone] phone [phone] fax

## 2017-02-24 ENCOUNTER — Ambulatory Visit: Payer: 59 | Admitting: Family Medicine

## 2017-02-25 ENCOUNTER — Ambulatory Visit (INDEPENDENT_AMBULATORY_CARE_PROVIDER_SITE_OTHER): Payer: 59 | Admitting: Family Medicine

## 2017-02-25 ENCOUNTER — Encounter: Payer: Self-pay | Admitting: Family Medicine

## 2017-02-25 VITALS — BP 123/73 | HR 84 | Wt 175.1 lb

## 2017-02-25 DIAGNOSIS — N920 Excessive and frequent menstruation with regular cycle: Secondary | ICD-10-CM | POA: Diagnosis not present

## 2017-02-25 DIAGNOSIS — L0292 Furuncle, unspecified: Secondary | ICD-10-CM | POA: Diagnosis not present

## 2017-02-25 MED ORDER — SULFAMETHOXAZOLE-TRIMETHOPRIM 800-160 MG PO TABS: 1.0000 | ORAL_TABLET | Freq: Two times a day (BID) | ORAL | 0 refills | Status: DC

## 2017-02-25 NOTE — Patient Instructions (Addendum)

## 2017-02-25 NOTE — Assessment & Plan Note (Signed)
S/p Novasure--not a great surgical candidate due to poor nutritional status and stress of prior surgery. Has had Mirena in the past--recommend this again.

## 2017-02-25 NOTE — Progress Notes (Signed)
   Subjective:    Patient ID: [name] is a 41 y.o. female presenting with No chief complaint on file.  on 02/25/2017  HPI: Patient is a [id number] with 3 prior vaginal births  Here for ED f/u. Having heavy periods and they are coming every 21-23 days and lasting 5-7 days following Novasure ablation in 2012. Cycles never really got better. U/s is consistent with adenomyosis.  Patient has had a tumultuous year with first gastric sleeve, uncontrolled N/V, need for G tube then gastric bypass. 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 Also reports bump on labia consistent with boil that is quite tender.  Review of Systems  Constitutional: Positive for unexpected weight change. Negative for chills and fever.  Respiratory: Negative for shortness of breath.   Cardiovascular: Negative for chest pain.  Gastrointestinal: Negative for abdominal pain, nausea and vomiting.  Genitourinary: Negative for dysuria.  Skin: Negative for rash.  Neurological: Positive for dizziness, weakness and numbness.      Objective:    BP 123/73   Pulse 84   Wt 175 lb 1.6 oz (79.4 kg)   LMP 02/10/2017   BMI 27.42 kg/m  Physical Exam  Constitutional: She is oriented to person, place, and time. She appears well-developed and well-nourished. No distress.  HENT:  Head: Normocephalic and atraumatic.  Eyes: No scleral icterus.  Neck: Neck supple.  Cardiovascular: Normal rate.   Pulmonary/Chest: Effort normal.  Abdominal: Soft.  Genitourinary:  Genitourinary Comments: Furuncle noted on mons  Neurological: She is alert and oriented to person, place, and time.  Skin: Skin is warm and dry.  Psychiatric: She has a normal mood and affect.        Assessment & Plan:   Problem List Items Addressed This Visit      Unprioritized   Excessive or frequent menstruation    S/p Novasure--not a great surgical candidate due to poor nutritional status and stress of prior surgery. Has had Mirena in the  past--recommend this again.       Other Visit Diagnoses    Boil    -  Primary   Relevant Medications   sulfamethoxazole-trimethoprim (BACTRIM DS,SEPTRA DS) 800-160 MG tablet      Total face-to-face time with patient: 30 minutes. Over 50% of encounter was spent on counseling and coordination of care. Return in about 4 weeks (around 03/25/2017) for IUD insertion.  [name] 02/25/2017 1:33 PM

## 2017-02-26 NOTE — Progress Notes (Signed)
Addendum: PHQ-9 and GAD 7 form found not yet entered, I entered today-elevated scores noted- notified Roselyn Reef our behavioral health therapist.

## 2017-03-01 ENCOUNTER — Encounter (HOSPITAL_COMMUNITY): Payer: Self-pay | Admitting: *Deleted

## 2017-03-01 NOTE — H&P (Signed)
[name] Location: [facility] Surgery Patient #: [id number] DOB: 03-09-1976 Single / Language: [name] / Race: Black or African American Female  History of Present Illness   The patient is a 41 year old female presenting status-post bariatric surgery. She returns 10 weeks following revision of sleeve gastrectomy to Roux-en-Y gastric bypass for persistent intolerance of oral intake. Also GERD. She had a feeding jejunostomy that was removed at the time of her gastric bypass. She unfortunately feels she has gotten worse since her last visit. We did get an upper GI series that showed a small hiatal hernia but no obstruction. There was some slight irregularity in the upper pouch and ulcer was questioned.   She has an endoscopy and colonoscopy scheduled early next week. She feels she is having increasing trouble with oral intake. Mostly vomiting liquids but sometimes keeping it down and keeping a lot of her supplements down. She feels weak and somewhat dizzy when standing up.  We checked lab last week which showed some microcytic anemia with hematocrit 31.4. She is also having some dysfunctional uterine bleeding which is being treated by her gynecologist. Renal function and chemistries were entirely normal.  Past Medical History: 1.  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  Upper endoscopy - 04/04/2016 - [facility] 3.  Depression 4.  Chronic joint  5.  Ulcerative colitis  Last colonoscopy by Dr. Carlean Purl - 06/11/2014 - he saw segmental colitis   Allergies (April Staton, CMA; 02/26/2017 9:09 AM) MORPHINE  Doxycycline Hyclate *Tetracyclines**  Penicillin G Pot in Dextrose *PENICILLINS*  OxyCODONE HCl *ANALGESICS - OPIOID*  Ciprofloxacin HCl *Fluoroquinolones**  Zofran ODT *ANTIEMETICS*   Medication History (April Staton, CMA; 02/26/2017 9:10 AM) Protonix (40MG Tablet DR, 1 (one) Oral two times daily, Taken starting 12/04/2016) Active. Carafate (1GM Tablet, 1 (one) Tablet  Oral four times daily, Taken starting 02/12/2017) Active. Pepcid (40MG Tablet, 1 (one) Tablet Oral daily, Taken starting 02/12/2017) Active. Proventil HFA (108 (90 Base)MCG/ACT Aerosol Soln, Inhalation) Active. Albuterol (90MCG/ACT Aerosol Soln, Inhalation) Active. Alisa Graff Vitamin D3 (1000UNIT/10ML Liquid, Oral) Active. Multivitamins (Oral) Active. EPINEPHrine (0.3MG/0.3ML Device, Injection) Active. Nutritional Supplement (Oral) Active. (1000 mL into feeding tube) Vitamin B1 (100MG Tablet, Oral) Active. Ondansetron (8MG Tablet Disint, Oral) Active.  Vitals (April Staton CMA; 02/26/2017 9:10 AM) 02/26/2017 9:10 AM Weight: 175.25 lb Height: 67in Body Surface Area: 1.91 m Body Mass Index: 27.45 kg/m  Temp.: 98.71F(Oral)  Pulse: 76 (Regular)  BP: 108/72 (Sitting, Left Arm, Standard)   Physical Exam  General: Alert and conversant and fairly good mood, no distress weight is down 11 pounds in 1 month current weight 175 BMI 27.4 Abdomen: Mild epigastric tenderness. No distention. No mass.  Assessment & Plan  1.  GASTRIC BYPASS STATUS FOR OBESITY (Z98.84)  Impression: 10 weeks following revision a sleeve gastrectomy to a gastric bypass - 12/14/2016.   Increasing difficulty with oral intake. Upper GI series shows no obstruction but possible ulcer. She is on maximum ulcer treatment with Protonix and Carafate. Endoscopy scheduled within the next several days. On exam today her vital signs are normal, normal heart rate and blood pressure and recent lab work shows no evidence of dehydration.   2.  Depression 3.  Chronic joint  4.  Ulcerative colitis  Plan to update colonscopy at the same time.  Alphonsa Overall, MD, [facility] Pager: [phone] Office phone:  [phone]

## 2017-03-02 ENCOUNTER — Other Ambulatory Visit: Payer: Self-pay | Admitting: Surgery

## 2017-03-02 ENCOUNTER — Ambulatory Visit (HOSPITAL_COMMUNITY): Payer: 59 | Admitting: Certified Registered"

## 2017-03-02 ENCOUNTER — Encounter (HOSPITAL_COMMUNITY)
Admission: RE | Disposition: A | Discharge status: Home / Self Care | Payer: Self-pay | Source: Ambulatory Visit | Attending: Surgery

## 2017-03-02 ENCOUNTER — Ambulatory Visit (HOSPITAL_COMMUNITY)
Admission: RE | Admit: 2017-03-02 | Discharge: 2017-03-02 | Disposition: A | Discharge status: Home / Self Care | Payer: 59 | Source: Ambulatory Visit | Attending: Surgery | Admitting: Surgery

## 2017-03-02 ENCOUNTER — Encounter (HOSPITAL_COMMUNITY): Payer: Self-pay | Admitting: *Deleted

## 2017-03-02 DIAGNOSIS — K921 Melena: Secondary | ICD-10-CM | POA: Diagnosis not present

## 2017-03-02 DIAGNOSIS — Z87891 Personal history of nicotine dependence: Secondary | ICD-10-CM | POA: Diagnosis not present

## 2017-03-02 DIAGNOSIS — E669 Obesity, unspecified: Secondary | ICD-10-CM | POA: Insufficient documentation

## 2017-03-02 DIAGNOSIS — Z79899 Other long term (current) drug therapy: Secondary | ICD-10-CM | POA: Diagnosis not present

## 2017-03-02 DIAGNOSIS — R110 Nausea: Secondary | ICD-10-CM | POA: Insufficient documentation

## 2017-03-02 DIAGNOSIS — K922 Gastrointestinal hemorrhage, unspecified: Secondary | ICD-10-CM | POA: Diagnosis not present

## 2017-03-02 DIAGNOSIS — K529 Noninfective gastroenteritis and colitis, unspecified: Secondary | ICD-10-CM | POA: Insufficient documentation

## 2017-03-02 DIAGNOSIS — Z6827 Body mass index (BMI) 27.0-27.9, adult: Secondary | ICD-10-CM | POA: Insufficient documentation

## 2017-03-02 DIAGNOSIS — Z9884 Bariatric surgery status: Secondary | ICD-10-CM | POA: Insufficient documentation

## 2017-03-02 DIAGNOSIS — K449 Diaphragmatic hernia without obstruction or gangrene: Secondary | ICD-10-CM | POA: Insufficient documentation

## 2017-03-02 DIAGNOSIS — K219 Gastro-esophageal reflux disease without esophagitis: Secondary | ICD-10-CM | POA: Diagnosis not present

## 2017-03-02 DIAGNOSIS — K6289 Other specified diseases of anus and rectum: Secondary | ICD-10-CM | POA: Diagnosis not present

## 2017-03-02 DIAGNOSIS — K644 Residual hemorrhoidal skin tags: Secondary | ICD-10-CM | POA: Diagnosis not present

## 2017-03-02 DIAGNOSIS — K625 Hemorrhage of anus and rectum: Secondary | ICD-10-CM | POA: Insufficient documentation

## 2017-03-02 DIAGNOSIS — K257 Chronic gastric ulcer without hemorrhage or perforation: Secondary | ICD-10-CM | POA: Diagnosis not present

## 2017-03-02 DIAGNOSIS — K648 Other hemorrhoids: Secondary | ICD-10-CM | POA: Insufficient documentation

## 2017-03-02 DIAGNOSIS — R1084 Generalized abdominal pain: Secondary | ICD-10-CM | POA: Diagnosis not present

## 2017-03-02 DIAGNOSIS — K289 Gastrojejunal ulcer, unspecified as acute or chronic, without hemorrhage or perforation: Secondary | ICD-10-CM | POA: Diagnosis not present

## 2017-03-02 HISTORY — PX: COLONOSCOPY WITH PROPOFOL: SHX5780

## 2017-03-02 HISTORY — PX: ESOPHAGOGASTRODUODENOSCOPY (EGD) WITH PROPOFOL: SHX5813

## 2017-03-02 HISTORY — DX: Furuncle, unspecified: L02.92

## 2017-03-02 HISTORY — DX: Nausea with vomiting, unspecified: R11.2

## 2017-03-02 SURGERY — COLONOSCOPY WITH PROPOFOL
Anesthesia: Monitor Anesthesia Care

## 2017-03-02 MED ORDER — PROPOFOL 10 MG/ML IV BOLUS
INTRAVENOUS | Status: AC
  Filled 2017-03-02: qty 40

## 2017-03-02 MED ORDER — LIDOCAINE 2% (20 MG/ML) 5 ML SYRINGE
INTRAMUSCULAR | Status: DC | PRN
Start: 2017-03-02 — End: 2017-03-02
  Administered 2017-03-02: 100 mg via INTRAVENOUS

## 2017-03-02 MED ORDER — PROPOFOL 10 MG/ML IV BOLUS
INTRAVENOUS | Status: AC
  Filled 2017-03-02: qty 20

## 2017-03-02 MED ORDER — LACTATED RINGERS IV SOLN
INTRAVENOUS | Status: DC
  Administered 2017-03-02: 1000 mL via INTRAVENOUS

## 2017-03-02 MED ORDER — PROPOFOL 10 MG/ML IV BOLUS
INTRAVENOUS | Status: DC | PRN
  Administered 2017-03-02: 30 mg via INTRAVENOUS
  Administered 2017-03-02: 20 mg via INTRAVENOUS

## 2017-03-02 MED ORDER — LIDOCAINE 2% (20 MG/ML) 5 ML SYRINGE
INTRAMUSCULAR | Status: AC
  Filled 2017-03-02: qty 5

## 2017-03-02 MED ORDER — INDOMETHACIN 50 MG RE SUPP
RECTAL | Status: AC
  Filled 2017-03-02: qty 1

## 2017-03-02 MED ORDER — SODIUM CHLORIDE 0.9 % IV SOLN: INTRAVENOUS | Status: DC

## 2017-03-02 MED ORDER — PROPOFOL 500 MG/50ML IV EMUL
INTRAVENOUS | Status: DC | PRN
  Administered 2017-03-02: 125 ug/kg/min via INTRAVENOUS

## 2017-03-02 SURGICAL SUPPLY — 25 items

## 2017-03-02 NOTE — Transfer of Care (Signed)
Immediate Anesthesia Transfer of Care Note  Patient: [name]  Procedure(s) Performed: Procedure(s): COLONOSCOPY WITH PROPOFOL (N/A) ESOPHAGOGASTRODUODENOSCOPY (EGD) WITH PROPOFOL (N/A)  Patient Location: PACU  Anesthesia Type:MAC  Level of Consciousness: awake, alert  and oriented  Airway & Oxygen Therapy: Patient Spontanous Breathing and Patient connected to nasal cannula oxygen  Post-op Assessment: Report given to RN and Post -op Vital signs reviewed and stable  Post vital signs: Reviewed and stable  Last Vitals:  Vitals:   03/02/17 1233  BP: 112/77  Pulse: 63  Resp: 10  Temp: 37.1 C    Last Pain:  Vitals:   03/02/17 1233  TempSrc: Oral         Complications: No apparent anesthesia complications

## 2017-03-02 NOTE — Interval H&P Note (Signed)
History and Physical Interval Note:  03/02/2017 1:13 PM  [name]  has presented today for surgery, with the diagnosis of rectal bleedig history of polyp and rule out ulcer  The various methods of treatment have been discussed with the patient and family.  She has a friend with her.  After consideration of risks, benefits and other options for treatment, the patient has consented to  Procedure(s): COLONOSCOPY WITH PROPOFOL (N/A) ESOPHAGOGASTRODUODENOSCOPY (EGD) WITH PROPOFOL (N/A) as a surgical intervention .  The patient's history has been reviewed, patient examined, no change in status, stable for surgery.  I have reviewed the patient's chart and labs.  Questions were answered to the patient's satisfaction.     Deziray Nabi H

## 2017-03-02 NOTE — Discharge Instructions (Signed)
Esophagogastroduodenoscopy, Care After Refer to this sheet in the next few weeks. These instructions provide you with information about caring for yourself after your procedure. Your health care provider may also give you more specific instructions. Your treatment has been planned according to current medical practices, but problems sometimes occur. Call your health care provider if you have any problems or questions after your procedure. What can I expect after the procedure? After the procedure, it is common to have:  A sore throat.  Nausea.  Bloating.  Dizziness.  Fatigue.  Follow these instructions at home:  Do not eat or drink anything until the numbing medicine (local anesthetic) has worn off and your gag reflex has returned. You will know that the local anesthetic has worn off when you can swallow comfortably.  Do not drive for 24 hours if you received a medicine to help you relax (sedative).  If your health care provider took a tissue sample for testing during the procedure, make sure to get your test results. This is your responsibility. Ask your health care provider or the department performing the test when your results will be ready.  Keep all follow-up visits as told by your health care provider. This is important. Contact a health care provider if:  You cannot stop coughing.  You are not urinating.  You are urinating less than usual. Get help right away if:  You have trouble swallowing.  You cannot eat or drink.  You have throat or chest pain that gets worse.  You are dizzy or light-headed.  You faint.  You have nausea or vomiting.  You have chills.  You have a fever.  You have severe abdominal pain.  You have black, tarry, or bloody stools. This information is not intended to replace advice given to you by your health care provider. Make sure you discuss any questions you have with your health care provider. Document Released: 08/31/2012 Document  Revised: 02/20/2016 Document Reviewed: 08/08/2015 Elsevier Interactive Patient Education  2018 [employer]. Colonoscopy, Adult, Care After This sheet gives you information about how to care for yourself after your procedure. Your health care provider may also give you more specific instructions. If you have problems or questions, contact your health care provider. What can I expect after the procedure? After the procedure, it is common to have:  A small amount of blood in your stool for 24 hours after the procedure.  Some gas.  Mild abdominal cramping or bloating.  Follow these instructions at home: General instructions   For the first 24 hours after the procedure: ? Do not drive or use machinery. ? Do not sign important documents. ? Do not drink alcohol. ? Do your regular daily activities at a slower pace than normal. ? Eat soft, easy-to-digest foods. ? Rest often.  Take over-the-counter or prescription medicines only as told by your health care provider.  It is up to you to get the results of your procedure. Ask your health care provider, or the department performing the procedure, when your results will be ready. Relieving cramping and bloating  Try walking around when you have cramps or feel bloated.  Apply heat to your abdomen as told by your health care provider. Use a heat source that your health care provider recommends, such as a moist heat pack or a heating pad. ? Place a towel between your skin and the heat source. ? Leave the heat on for 20-30 minutes. ? Remove the heat if your skin turns bright red.  This is especially important if you are unable to feel pain, heat, or cold. You may have a greater risk of getting burned. Eating and drinking  Drink enough fluid to keep your urine clear or pale yellow.  Resume your normal diet as instructed by your health care provider. Avoid heavy or fried foods that are hard to digest.  Avoid drinking alcohol for as long as  instructed by your health care provider. Contact a health care provider if:  You have blood in your stool 2-3 days after the procedure. Get help right away if:  You have more than a small spotting of blood in your stool.  You pass large blood clots in your stool.  Your abdomen is swollen.  You have nausea or vomiting.  You have a fever.  You have increasing abdominal pain that is not relieved with medicine. This information is not intended to replace advice given to you by your health care provider. Make sure you discuss any questions you have with your health care provider. Document Released: 04/28/2004 Document Revised: 06/08/2016 Document Reviewed: 11/26/2015 Elsevier Interactive Patient Education  2018 [place] TO PATIENT  Activity:  Driving - May drive tomorrow  Diet:  As tolerated, bariatric  Follow up appointment:  You have an appt with Dr. Excell Seltzer at the office [facility]) in approximately in 1 month  Medications and dosages:  Resume your home medications.  Continue protonix and carafate.  Call Dr. Excell Seltzer or his office  [phone]) if you have:  Temperature greater than 100.4,  Persistent nausea and vomiting,  Any other questions or concerns you may have after discharge.  In an emergency, call 911 or go to an Emergency Department at a nearby hospital.

## 2017-03-02 NOTE — Anesthesia Preprocedure Evaluation (Signed)
Anesthesia Evaluation  Patient identified by MRN, date of birth, ID band Patient awake    Reviewed: Allergy & Precautions, NPO status , Patient's Chart, lab work & pertinent test results  History of Anesthesia Complications (+) PONV  Airway Mallampati: II  TM Distance: >3 FB Neck ROM: Full    Dental no notable dental hx.    Pulmonary asthma , former smoker,    Pulmonary exam normal breath sounds clear to auscultation       Cardiovascular negative cardio ROS Normal cardiovascular exam Rhythm:Regular Rate:Normal     Neuro/Psych negative neurological ROS  negative psych ROS   GI/Hepatic Neg liver ROS, hiatal hernia, PUD, GERD  ,UC   Endo/Other  negative endocrine ROS  Renal/GU negative Renal ROS  negative genitourinary   Musculoskeletal negative musculoskeletal ROS (+)   Abdominal   Peds negative pediatric ROS (+)  Hematology negative hematology ROS (+)   Anesthesia Other Findings   Reproductive/Obstetrics negative OB ROS                             Anesthesia Physical Anesthesia Plan  ASA: II  Anesthesia Plan: MAC   Post-op Pain Management:    Induction:   PONV Risk Score and Plan: 3 and Ondansetron and Propofol  Airway Management Planned:   Additional Equipment:   Intra-op Plan:   Post-operative Plan:   Informed Consent: I have reviewed the patients History and Physical, chart, labs and discussed the procedure including the risks, benefits and alternatives for the proposed anesthesia with the patient or authorized representative who has indicated his/her understanding and acceptance.   Dental advisory given  Plan Discussed with:   Anesthesia Plan Comments:         Anesthesia Quick Evaluation

## 2017-03-02 NOTE — Op Note (Addendum)
03/02/2017  2:34 PM  PATIENT:  [name], 41 y.o., female, MRN: [id number]  PREOP DIAGNOSIS:  History of colitis and polyp,.  POSTOP DIAGNOSIS:   Normal appearing colon without gross evidence of colitis on this exam.  I did blind biopsies at 30 cm, 20 cm, and 10 cm.  She has internal and external hemorrhoids.  PROCEDURE:   Procedure(s): COLONOSCOPY WITH PROPOFOL    SURGEON:   Alphonsa Overall, M.D.  ANESTHESIA:   MAC  Anesthesiologist: [name], MD CRNA: Noralyn Pick D, CRNA  Monitor Anesthesia Care  SPECIMEN:   Biopsies of the mucosa at 30 cm, 20 cm, and 10 cm.  INDICATIONS FOR PROCEDURE:  [name] is a 41 y.o. (DOB: 11-07-1975) AA female whose primary care physician is Wardell Honour, MD and comes for colonoscopy.  (she underwent upper endo at the same time)   At her last colonoscopy, 06/11/2014, by Dr. Ronni Rumble, she has some evidence of segmental colitis, from 35 to 45 cm.  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  I am doing an upper endo to evaluate nausea and abdominal pain that [name] is having after gastric bypass by Dr. Excell Seltzer.  She asked me to do the colonoscopy at the same time.    The indications and risks of colonoscopy were explained to the patient.  The risks include, but are not limited to, perforation of the bowel and bleeding.  OPERATIVE NOTE:  The patient was taken to room # 1 in the [facility] endoscopy suite.  The patient was monitored with pulse oximetry, blood pressure cuff, and cardiac monitor.  The had 2 liters of nasal O2 during the procedure.  A time out was held and the checklist reviewed.   The patient was sedated by anesthesia with propofol.   A digital rectal exam was done at the beginning of the procedure..  The anus and rectum were unremarkable, except she does have some external hemorrhoids.     The flexible Pentax colonoscope was passed up the rectum without difficulty.  The scope was advanced to the cecum and  the ileocecal valve was identified.  The colonic prep was good, in the fact that she was only able to get down 1/2 of it.   The right colon, transverse colon, left colon, and sigmoid colon were unremarkable.   Start time was 13:48.  Time at cecum was 14:07.  Time out was 14:19.   The scope was withdrawn into the rectum and retroflexed.  The rectum was unremarkable, except for some internal hemorrhoids.    Particular attention was paid to 50 cm and coming out.  At the last colonoscopy, the colitis was particularly noted at 45 - 35 cm.  Though the mucosa was beat up by my scope and maybe a little more friable, I did not find much evidence of colitis.  I took biopsies at 30 cm, 20 cm, and 10 cm from the anal verge.  The scope was retroflexed in the rectum and she has some internal hemorrhoids.   Photos were taken during the procedure and placed in the chart.   The patient was taken to the recovery area of the [facility] endoscopy in good condition.  Sanjuana Letters, a friend, was with the patient and I explained the findings of the procedure.   The patient's next colonoscopy should be in 3 to 5 years, depending on the biopsies.  Alphonsa Overall, MD, [facility] Pager: [phone] Office phone:  [phone]

## 2017-03-02 NOTE — Op Note (Addendum)
03/02/2017  2:29 PM  PATIENT:  [name], 41 y.o., female, MRN: [id number]  PREOP DIAGNOSIS:  History of Gastric bypass, nausea  POSTOP DIAGNOSIS:   History of gastric bypass, marginal ulcer at gastrojejunostomy  PROCEDURE:  Esophagogastrojejunoscopy  SURGEON:   Alphonsa Overall, M.D.  ANESTHESIA:   Propofol by anesthesia.  INDICATIONS FOR PROCEDURE:  [name] is a 41 y.o. (DOB: June 29, 1976)  AA female whose primary care physician is Wardell Honour, MD and comes for upper endoscopy to evaluate nausea and abdominal pain.  The patient had a RYGB on 12/14/2016 by Dr. Excell Seltzer.  (she underwent colonoscopy at the same time)   The indications and risks of the endoscopy were explained to the patient.  The risks include, but are not limited to, perforation, bleeding, or injury to the bowel.  If balloon dilatation is needed, the risk of perforation is higher.  PROCEDURE:  The patient was in room 1 at [facility] endoscopy unit.  The patient was monitored with a pulse oximetry, BP cuff, and EKG.  The patient had anesthesia by nurse anesthesia.   A time was held prior to the procedure.   The patient was positioned in the left lateral decubitus position.  A flexible Pentax endoscope was passed down the throat without difficulty.   Findings include:   Esophagus:   Normal   GE junction at:  38 cm - even though there was an abnormality on the UGI in this area, I saw only post op changes   Stomach pouch: Normal   Gastrojejunal anastomosis:   47 cm.  She has 2 superficial marginal ulcers.  The anastomosis is widely patent and the scope went though the anastomosis easily.  I would guess that the opening is at least 2 cm.   Efferent jejunal limb:  20 cm, no mass or mucosal inflammation Afferent jejunal limb:  5 cm, no mass or mucosal inflammation   CLO test:  Yes  PLAN:   Photos taken and given to patient.    Will confirm that she is on protonix and carafate.  Alphonsa Overall, MD, [facility] Pager: [phone] Office phone:  [phone]

## 2017-03-03 ENCOUNTER — Encounter (HOSPITAL_COMMUNITY): Payer: Self-pay | Admitting: Surgery

## 2017-03-03 LAB — CLOTEST (H. PYLORI), BIOPSY: HELICOBACTER SCREEN: NEGATIVE

## 2017-03-03 NOTE — Anesthesia Postprocedure Evaluation (Signed)
Anesthesia Post Note  Patient: [name]  Procedure(s) Performed: Procedure(s) (LRB): COLONOSCOPY WITH PROPOFOL (N/A) ESOPHAGOGASTRODUODENOSCOPY (EGD) WITH PROPOFOL (N/A)     Patient location during evaluation: Endoscopy Anesthesia Type: MAC Level of consciousness: awake and alert Pain management: pain level controlled Vital Signs Assessment: post-procedure vital signs reviewed and stable Respiratory status: spontaneous breathing, nonlabored ventilation, respiratory function stable and patient connected to nasal cannula oxygen Cardiovascular status: stable and blood pressure returned to baseline Anesthetic complications: no    Last Vitals:  Vitals:   03/02/17 1440 03/02/17 1450  BP: 113/66 119/72  Pulse: 61 63  Resp: 18 16  Temp:  36.6 C    Last Pain:  Vitals:   03/02/17 1233  TempSrc: Oral                 [name]

## 2017-03-08 ENCOUNTER — Encounter: Payer: Self-pay | Admitting: Skilled Nursing Facility1

## 2017-03-08 ENCOUNTER — Encounter: Payer: 59 | Attending: Family Medicine | Admitting: Skilled Nursing Facility1

## 2017-03-08 DIAGNOSIS — Z6841 Body Mass Index (BMI) 40.0 and over, adult: Secondary | ICD-10-CM

## 2017-03-08 DIAGNOSIS — Z6830 Body mass index (BMI) 30.0-30.9, adult: Secondary | ICD-10-CM | POA: Diagnosis not present

## 2017-03-08 DIAGNOSIS — E669 Obesity, unspecified: Secondary | ICD-10-CM | POA: Diagnosis present

## 2017-03-08 DIAGNOSIS — Z713 Dietary counseling and surveillance: Secondary | ICD-10-CM | POA: Insufficient documentation

## 2017-03-08 DIAGNOSIS — Z9884 Bariatric surgery status: Secondary | ICD-10-CM | POA: Diagnosis present

## 2017-03-08 NOTE — Progress Notes (Signed)
  Post Op appt: Pt states she Does not like to leave the house due to self image/depression. Pt states her Coloscopy showed colitis as well as ulcers. Pt states she has been sipping on a limeade drink and trying to drink water. Pt states when she is dehydrated she is lightheaded and dizzy so she tries to stay hydrated. Pt states 4 pieces of macaroni noodles stayed down but then got hot and sweaty. Pt states her son was eating ice cream so she had some and then started getting hot and sweaty. Pt states she sleeps a lot still. Pt states she is taking a iron supplement.    02/17/2017 03/08/2017   BMI (kg/m^2) 28.1 27.4   Fat Mass (lbs) 71.8 64   Fat Free Mass (lbs) 105.2 111   Total Body Water (lbs) 75 79   Surgery date: 12/14/2016 Surgery type: RYGB Start weight at [facility]: 230 Weight today: 175 Weight change: 20.5  TANITA  BODY COMP RESULTS  12/29/2016   BMI (kg/m^2) 31.1   Fat Mass (lbs) 86.4   Fat Free Mass (lbs) 109.4   Total Body Water (lbs) 78.8    24-hr recall: for the past 2 weeks going on 3 weeks B (AM): vitamins, protonix, pepcid hour later carafate with sips of water Snk (AM): nausea medicine with sips of water L (PM):  Snk (PM):  D (PM):  Snk (PM):   Fluid intake: Gatorade, limeade/blueberry aid, liquid calcium, liquid multivitamin  Estimated total protein intake: insufficient   Medications: See List Supplementation: sometimes without throwing up Recent physical activity:  ADL's  Goals: -Try protein2o at [employer] -Try unflavored protein powder in decaf coffee  -Try protein powder in almond milk or water -Try the tomato soup protein powder

## 2017-03-08 NOTE — Patient Instructions (Addendum)
-  Try protein2o at [employer]  -Try unflavored protein powder in decaf coffee   -Try protein powder in almond milk or water  -Try the tomato soup protein powder

## 2017-03-10 NOTE — Telephone Encounter (Signed)
error 

## 2017-03-18 ENCOUNTER — Telehealth: Payer: Self-pay | Admitting: Family Medicine

## 2017-03-18 DIAGNOSIS — F5104 Psychophysiologic insomnia: Secondary | ICD-10-CM | POA: Insufficient documentation

## 2017-03-18 NOTE — Telephone Encounter (Signed)
I spoke with [name] trying to reschedule her appointment that she had with Tamala Julian on 03-30-17 [name] states that she will call us back to reschedule she didn't have time right now because she had just fell asleep

## 2017-03-24 DIAGNOSIS — K9423 Gastrostomy malfunction: Secondary | ICD-10-CM | POA: Diagnosis not present

## 2017-03-26 ENCOUNTER — Encounter: Payer: Self-pay | Admitting: Family Medicine

## 2017-03-26 ENCOUNTER — Ambulatory Visit (INDEPENDENT_AMBULATORY_CARE_PROVIDER_SITE_OTHER): Payer: 59 | Admitting: Family Medicine

## 2017-03-26 VITALS — BP 107/80 | HR 80 | Wt 170.5 lb

## 2017-03-26 DIAGNOSIS — E46 Unspecified protein-calorie malnutrition: Secondary | ICD-10-CM

## 2017-03-26 DIAGNOSIS — N920 Excessive and frequent menstruation with regular cycle: Secondary | ICD-10-CM

## 2017-03-26 DIAGNOSIS — D500 Iron deficiency anemia secondary to blood loss (chronic): Secondary | ICD-10-CM | POA: Diagnosis not present

## 2017-03-26 NOTE — Progress Notes (Signed)
   Subjective:    Patient ID: [name] is a 41 y.o. [phone]  female presenting with Discuss IUD  on 03/26/2017  HPI: Patient returns today for f/u. She has been previously evaluated for heavy bleeding. She has a significant h/o malnutrition and significant weight loss due to gastric bypass and persistent N/V. Previously had BTL and Novasure due to Mirena causing cramping and bleeding. We had planned to put an IUD in today, but she reports that this did not work for her in past. She would like to pursue more permanent treatment options. On ultrasound, uterus measures 9.5 x 5.1 x 5.5 and has findings consistent with adenomyosis. She has a h/o SVD x 4.  Review of Systems  Constitutional: Negative for chills and fever.  Respiratory: Negative for shortness of breath.   Cardiovascular: Negative for chest pain.  Gastrointestinal: Negative for abdominal pain, nausea and vomiting.  Genitourinary: Negative for dysuria.  Skin: Negative for rash.      Objective:    BP 107/80   Pulse 80   Wt 170 lb 8 oz (77.3 kg)   BMI 26.70 kg/m  Physical Exam  Constitutional: She is oriented to person, place, and time. She appears well-developed and well-nourished. No distress.  HENT:  Head: Normocephalic and atraumatic.  Eyes: No scleral icterus.  Neck: Neck supple.  Cardiovascular: Normal rate.   Pulmonary/Chest: Effort normal.  Abdominal: Soft.  Neurological: She is alert and oriented to person, place, and time.  Skin: Skin is warm and dry.  Psychiatric: She has a normal mood and affect.        Assessment & Plan:   Problem List Items Addressed This Visit      Unprioritized   Excessive or frequent menstruation    Likely related to adenomyosis who has tried and failed IUD, and is s/p Novosure. For definitive treatment with hysterectomy. Will proceed with TVH. Risks include but are not limited to bleeding, infection, injury to surrounding structures, including bowel, bladder and ureters,  blood clots, and death.  Likelihood of success is high.       Anemia, iron deficiency - Primary   Malnutrition, calorie ([place])    Poor healing and poor surgical candidate discussed at length--but she strongly desires to proceed with surgery at this time.         Total face-to-face time with patient: 25 minutes. Over 50% of encounter was spent on counseling and coordination of care. Return in about 3 months (around 06/26/2017) for postop check.  [name] 03/26/2017 11:18 AM

## 2017-03-26 NOTE — Patient Instructions (Signed)
Hysterectomy Information A hysterectomy is a surgery in which your uterus is removed. This surgery may be done to treat various medical problems. After the surgery, you will no longer have menstrual periods. The surgery will also make you unable to become pregnant (sterile). The fallopian tubes and ovaries can be removed (bilateral salpingo-oophorectomy) during this surgery as well. Reasons for a hysterectomy  Persistent, abnormal bleeding.  Lasting (chronic) pelvic pain or infection.  The lining of the uterus (endometrium) starts growing outside the uterus (endometriosis).  The endometrium starts growing in the muscle of the uterus (adenomyosis).  The uterus falls down into the vagina (pelvic organ prolapse).  Noncancerous growths in the uterus (uterine fibroids) that cause symptoms.  Precancerous cells.  Cervical cancer or uterine cancer. Types of hysterectomies  Supracervical hysterectomy-In this type, the top part of the uterus is removed, but not the cervix.  Total hysterectomy-The uterus and cervix are removed.  Radical hysterectomy-The uterus, the cervix, and the fibrous tissue that holds the uterus in place in the pelvis (parametrium) are removed. Ways a hysterectomy can be performed  Abdominal hysterectomy-A large surgical cut (incision) is made in the abdomen. The uterus is removed through this incision.  Vaginal hysterectomy-An incision is made in the vagina. The uterus is removed through this incision. There are no abdominal incisions.  Conventional laparoscopic hysterectomy-Three or four small incisions are made in the abdomen. A thin, lighted tube with a camera (laparoscope) is inserted into one of the incisions. Other tools are put through the other incisions. The uterus is cut into small pieces. The small pieces are removed through the incisions, or they are removed through the vagina.  Laparoscopically assisted vaginal hysterectomy (LAVH)-Three or four small  incisions are made in the abdomen. Part of the surgery is performed laparoscopically and part vaginally. The uterus is removed through the vagina.  Robot-assisted laparoscopic hysterectomy-A laparoscope and other tools are inserted into 3 or 4 small incisions in the abdomen. A computer-controlled device is used to give the surgeon a 3D image and to help control the surgical instruments. This allows for more precise movements of surgical instruments. The uterus is cut into small pieces and removed through the incisions or removed through the vagina. What are the risks? Possible complications associated with this procedure include:  Bleeding and risk of blood transfusion. Tell your health care provider if you do not want to receive any blood products.  Blood clots in the legs or lung.  Infection.  Injury to surrounding organs.  Problems or side effects related to anesthesia.  Conversion to an abdominal hysterectomy from one of the other techniques.  What to expect after a hysterectomy  You will be given pain medicine.  You will need to have someone with you for the first 3-5 days after you go home.  You will need to follow up with your surgeon in 2-4 weeks after surgery to evaluate your progress.  You may have early menopause symptoms such as hot flashes, night sweats, and insomnia.  If you had a hysterectomy for a problem that was not cancer or not a condition that could lead to cancer, then you no longer need Pap tests. However, even if you no longer need a Pap test, a regular exam is a good idea to make sure no other problems are starting. This information is not intended to replace advice given to you by your health care provider. Make sure you discuss any questions you have with your health care provider. Document Released:  03/10/2001 Document Revised: 02/20/2016 Document Reviewed: 05/22/2013 Elsevier Interactive Patient Education  2017 [employer].

## 2017-03-27 DIAGNOSIS — E440 Moderate protein-calorie malnutrition: Secondary | ICD-10-CM | POA: Insufficient documentation

## 2017-03-27 DIAGNOSIS — E46 Unspecified protein-calorie malnutrition: Secondary | ICD-10-CM | POA: Insufficient documentation

## 2017-03-27 NOTE — Assessment & Plan Note (Signed)
Poor healing and poor surgical candidate discussed at length--but she strongly desires to proceed with surgery at this time.

## 2017-03-27 NOTE — Assessment & Plan Note (Signed)
Likely related to adenomyosis who has tried and failed IUD, and is s/p Novosure. For definitive treatment with hysterectomy. Will proceed with TVH. Risks include but are not limited to bleeding, infection, injury to surrounding structures, including bowel, bladder and ureters, blood clots, and death.  Likelihood of success is high.

## 2017-03-30 ENCOUNTER — Ambulatory Visit: Payer: 59 | Admitting: Family Medicine

## 2017-04-02 ENCOUNTER — Other Ambulatory Visit: Payer: Self-pay | Admitting: General Surgery

## 2017-04-02 ENCOUNTER — Ambulatory Visit: Payer: Self-pay | Admitting: General Surgery

## 2017-04-02 DIAGNOSIS — E860 Dehydration: Secondary | ICD-10-CM | POA: Insufficient documentation

## 2017-04-02 MED ORDER — ONDANSETRON 4 MG PO TBDP: 4.0000 mg | ORAL_TABLET | ORAL | Status: DC | PRN

## 2017-04-02 MED ORDER — SODIUM CHLORIDE 0.9 % IV SOLN
4.0000 mg | INTRAVENOUS | Status: DC | PRN
  Administered 2017-05-20: 4 mg via INTRAVENOUS

## 2017-04-02 MED ORDER — THIAMINE HCL 100 MG/ML IJ SOLN: Freq: Once | INTRAVENOUS | Status: DC

## 2017-04-05 ENCOUNTER — Other Ambulatory Visit (HOSPITAL_COMMUNITY): Payer: Self-pay | Admitting: General Surgery

## 2017-04-05 ENCOUNTER — Other Ambulatory Visit: Payer: Self-pay | Admitting: General Surgery

## 2017-04-05 ENCOUNTER — Encounter (HOSPITAL_COMMUNITY): Payer: Self-pay

## 2017-04-05 ENCOUNTER — Telehealth: Payer: Self-pay | Admitting: *Deleted

## 2017-04-05 ENCOUNTER — Ambulatory Visit (HOSPITAL_COMMUNITY)
Admission: RE | Admit: 2017-04-05 | Discharge: 2017-04-05 | Disposition: A | Discharge status: Home / Self Care | Payer: 59 | Source: Ambulatory Visit | Attending: Family Medicine | Admitting: Family Medicine

## 2017-04-05 DIAGNOSIS — E860 Dehydration: Secondary | ICD-10-CM | POA: Insufficient documentation

## 2017-04-05 DIAGNOSIS — D5700 Hb-SS disease with crisis, unspecified: Secondary | ICD-10-CM

## 2017-04-05 MED ORDER — ONDANSETRON 4 MG PO TBDP
4.0000 mg | ORAL_TABLET | ORAL | Status: DC | PRN
  Filled 2017-04-05: qty 1

## 2017-04-05 MED ORDER — THIAMINE HCL 100 MG/ML IJ SOLN
Freq: Once | INTRAVENOUS | Status: DC
  Filled 2017-04-05: qty 1000

## 2017-04-05 NOTE — Progress Notes (Signed)
Dr. Lear Ng office notified that we are unable to get an IV established on this patient.

## 2017-04-05 NOTE — Telephone Encounter (Signed)
[name] called this am and left a message she was supposed to receive a call from surgery scheduler but hasn't yet.

## 2017-04-06 ENCOUNTER — Emergency Department (HOSPITAL_COMMUNITY)
Admission: EM | Admit: 2017-04-06 | Discharge: 2017-04-07 | Disposition: A | Discharge status: Home / Self Care | Payer: 59 | Attending: Emergency Medicine | Admitting: Emergency Medicine

## 2017-04-06 ENCOUNTER — Emergency Department (HOSPITAL_COMMUNITY): Payer: 59

## 2017-04-06 ENCOUNTER — Encounter (HOSPITAL_COMMUNITY): Payer: Self-pay | Admitting: Interventional Radiology

## 2017-04-06 ENCOUNTER — Ambulatory Visit (HOSPITAL_COMMUNITY)
Admission: RE | Admit: 2017-04-06 | Discharge: 2017-04-06 | Disposition: A | Discharge status: Home / Self Care | Payer: 59 | Source: Ambulatory Visit | Attending: General Surgery | Admitting: General Surgery

## 2017-04-06 ENCOUNTER — Other Ambulatory Visit (HOSPITAL_COMMUNITY): Payer: Self-pay | Admitting: General Surgery

## 2017-04-06 DIAGNOSIS — E860 Dehydration: Secondary | ICD-10-CM | POA: Insufficient documentation

## 2017-04-06 DIAGNOSIS — R079 Chest pain, unspecified: Secondary | ICD-10-CM | POA: Diagnosis not present

## 2017-04-06 DIAGNOSIS — I878 Other specified disorders of veins: Secondary | ICD-10-CM

## 2017-04-06 DIAGNOSIS — Z87891 Personal history of nicotine dependence: Secondary | ICD-10-CM | POA: Insufficient documentation

## 2017-04-06 DIAGNOSIS — J45909 Unspecified asthma, uncomplicated: Secondary | ICD-10-CM | POA: Insufficient documentation

## 2017-04-06 DIAGNOSIS — Z79899 Other long term (current) drug therapy: Secondary | ICD-10-CM | POA: Insufficient documentation

## 2017-04-06 DIAGNOSIS — D5700 Hb-SS disease with crisis, unspecified: Secondary | ICD-10-CM

## 2017-04-06 DIAGNOSIS — Z452 Encounter for adjustment and management of vascular access device: Secondary | ICD-10-CM | POA: Diagnosis not present

## 2017-04-06 HISTORY — PX: IR US GUIDE VASC ACCESS RIGHT: IMG2390

## 2017-04-06 HISTORY — PX: IR FLUORO GUIDE CV LINE RIGHT: IMG2283

## 2017-04-06 LAB — BASIC METABOLIC PANEL
Anion gap: 9 (ref 5–15)
BUN: 8 mg/dL (ref 6–20)
CHLORIDE: 111 mmol/L (ref 101–111)
CO2: 19 mmol/L — ABNORMAL LOW (ref 22–32)
CREATININE: 0.86 mg/dL (ref 0.44–1.00)
Calcium: 9.4 mg/dL (ref 8.9–10.3)
Glucose, Bld: 65 mg/dL (ref 65–99)
Potassium: 3.9 mmol/L (ref 3.5–5.1)
SODIUM: 139 mmol/L (ref 135–145)

## 2017-04-06 LAB — CBC
HCT: 33.3 % — ABNORMAL LOW (ref 36.0–46.0)
Hemoglobin: 10.3 g/dL — ABNORMAL LOW (ref 12.0–15.0)
MCH: 25.4 pg — ABNORMAL LOW (ref 26.0–34.0)
MCHC: 30.9 g/dL (ref 30.0–36.0)
MCV: 82 fL (ref 78.0–100.0)
PLATELETS: 374 10*3/uL (ref 150–400)
RBC: 4.06 MIL/uL (ref 3.87–5.11)
RDW: 16.1 % — AB (ref 11.5–15.5)
WBC: 7.7 10*3/uL (ref 4.0–10.5)

## 2017-04-06 LAB — I-STAT TROPONIN, ED: TROPONIN I, POC: 0.01 ng/mL (ref 0.00–0.08)

## 2017-04-06 MED ORDER — PROMETHAZINE HCL 25 MG/ML IJ SOLN
12.5000 mg | Freq: Once | INTRAMUSCULAR | Status: AC
  Administered 2017-04-06: 12.5 mg via INTRAVENOUS
  Filled 2017-04-06: qty 1

## 2017-04-06 MED ORDER — SODIUM CHLORIDE 0.9 % IV BOLUS (SEPSIS)
2000.0000 mL | Freq: Once | INTRAVENOUS | Status: AC
Start: 2017-04-06 — End: 2017-04-07
  Administered 2017-04-06: 2000 mL via INTRAVENOUS

## 2017-04-06 MED ORDER — HYDROCODONE-ACETAMINOPHEN 5-325 MG PO TABS
1.0000 | ORAL_TABLET | Freq: Once | ORAL | Status: AC
  Administered 2017-04-06: 1 via ORAL

## 2017-04-06 MED ORDER — HYDROCODONE-ACETAMINOPHEN 5-325 MG PO TABS
ORAL_TABLET | ORAL | Status: AC
  Filled 2017-04-06: qty 1

## 2017-04-06 MED ORDER — LIDOCAINE HCL (PF) 1 % IJ SOLN
INTRAMUSCULAR | Status: DC | PRN
  Administered 2017-04-06: 10 mL

## 2017-04-06 NOTE — ED Triage Notes (Signed)
Pt presents to the ed for being dehydrated. She has had problems keeping food down since her stomach sleeve surgery in April of last year. She went to the sickle cell office to receive her fluids that she receives from them weekly. They could not get her IV so they sent her here this morning to get a PICC line placed and told her to come straight here after to receive fluids. Pt endorses weakness and dizziness that she also states she gets every time she gets dehydrated. Pt now endorses chest pain as well.

## 2017-04-06 NOTE — Procedures (Signed)
Interventional Radiology Procedure Note  Procedure:  Right arm PICC placement  Complications: None  Estimated Blood Loss: < 10 mL  34 cm DL Power PICC via right brachial v.  Tip at SVC/RA junction.  Venetia Night. Kathlene Cote, M.D Pager:  [phone]

## 2017-04-07 NOTE — Telephone Encounter (Signed)
I called [name] and she still has not heard from surgery scheduler. I informed her I would sent a message to that office and she hopefully will hear something soon. She voices understanding.

## 2017-04-07 NOTE — ED Provider Notes (Signed)
[name] DEPT Provider Note   CSN: [id number] Arrival date & time: 04/06/17  1522     History   Chief Complaint Chief Complaint  Patient presents with  . Nausea  . Chest Pain  . dehydrated    HPI [name] is a 41 y.o. female.  Patient with the nutritional complications following gastric sleeve. Followed closely by Dr. Excell Seltzer. Patient normally goes to sickle cell clinic once a week for special nutrient IV treatment. Patient was supposed to have this done the other day. They couldn't get an IV. So she was set up for a PICC line which was placed over here at [facility] this morning. The PICC line nurse then now referred her here to get her special IV treatment. Patient has been lightheaded feels as if she's dehydrated patients had several falling episodes which she says consistent when she's dehydrated. No true syncope. No injuries. Patient feels as if she would benefit from 2 L of fluid. Unfortunately we probably do not have the special concoction or can't determine what that normally is. Will check  special orders on epic.      Past Medical History:  Diagnosis Date  . Anal fissure - posterior 10/16/2014  . Anxiety    doesn't take anything  . Asthma    has Albuterol inhaler as needed  . Boil    on pubic area started septra 03-01-17 draining blood and pus  . Bronchitis   . Chronic headache disorder 07/22/2016  . Clostridium difficile infection 04/20/2012  . Colitis   . Dehydration   . Depression    doesn't take any meds  . Family history of adverse reaction to anesthesia    pt mom gets sick  . GERD (gastroesophageal reflux disease)    takes Pantoprazole daily  . Headache   . Hiatal hernia   . History of blood transfusion    last transfusion was 04/04/2016=Benadryl was given d/t itching. States she always itches with transfusion.   Marland Kitchen History of bronchitis    > 2 yrs ago  . History of colon polyps    benign  . History of migraine    last one 05/01/16  . History  of urinary tract infection   . IDA (iron deficiency anemia)   . Internal and external bleeding hemorrhoids 06/11/2014  . Joint pain   . Joint swelling   . Left sided chronic colitis - segmental 06/11/2014  . Migraine   . Motion sickness   . Nausea    takes Zofran as needed  . Nausea and vomiting    for 1 year  . Obesity   . Oligouria   . Osteoarthritis    back  . Pneumonia 1997  . Postoperative nausea and vomiting 01/21/2016   wants scopolamine patch  . TOBACCO USER 10/02/2009   Qualifier: Diagnosis of  By: Dimas Millin MD, Ellard Artis    . Transfusion history    last transfusion 6'16   . UC (ulcerative colitis) ([place])    supposed to be taking Lialda and Bentyl but has been off since gastric sleeve  . Ulcerative colitis ([place])   . Vertigo    doesn't take any meds    Patient Active Problem List   Diagnosis Date Noted  . Dehydration 04/02/2017  . Malnutrition, calorie ([place]) 03/27/2017  . Psychophysiological insomnia 03/18/2017  . Nausea and vomiting 12/14/2016  . Paresthesia 07/22/2016  . Chronic headache disorder 07/22/2016  . Memory difficulty 07/22/2016  . Hematochezia 04/04/2016  . 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 Postoperative nausea and vomiting 01/21/2016  . Anemia, iron deficiency 10/01/2015  . Skin lesion 05/18/2015  . Anal fissure - posterior 10/16/2014  . Left sided chronic colitis - segmental 06/11/2014  . Internal and external bleeding hemorrhoids 06/11/2014  . Asthma 04/26/2012  . Excessive or frequent menstruation 10/16/2011  . DEPRESSION, MILD 10/02/2009    Past Surgical History:  Procedure Laterality Date  . COLONOSCOPY  2007   for rectal bleeding; [facility]  . COLONOSCOPY N/A 06/11/2014   Procedure: COLONOSCOPY;  Surgeon: Gatha Mayer, MD;  Location: [facility];  Service: Endoscopy;  Laterality: N/A;  . COLONOSCOPY WITH PROPOFOL N/A 03/02/2017   Procedure: COLONOSCOPY WITH PROPOFOL;  Surgeon: Alphonsa Overall, MD;  Location: [facility];  Service:  General;  Laterality: N/A;  . DILATION AND CURETTAGE OF UTERUS    . ESOPHAGOGASTRODUODENOSCOPY (EGD) WITH PROPOFOL N/A 04/06/2016   Procedure: ESOPHAGOGASTRODUODENOSCOPY (EGD) WITH PROPOFOL;  Surgeon: Alphonsa Overall, MD;  Location: [facility];  Service: General;  Laterality: N/A;  . ESOPHAGOGASTRODUODENOSCOPY (EGD) WITH PROPOFOL N/A 03/02/2017   Procedure: ESOPHAGOGASTRODUODENOSCOPY (EGD) WITH PROPOFOL;  Surgeon: Alphonsa Overall, MD;  Location: [facility];  Service: General;  Laterality: N/A;  . FOOT SURGERY Bilateral    x 2  . GASTRIC ROUX-EN-Y N/A 12/14/2016   Procedure: LAPAROSCOPIC REVISION SLEEVE GASTRECTOMY TO  ROUX-Y-GASTRIC BY-PASS, UPPER ENDO;  Surgeon: Excell Seltzer, MD;  Location: [facility];  Service: General;  Laterality: N/A;  . GASTROJEJUNOSTOMY N/A 05/11/2016   Procedure: LAPAROSCOPIC PLACEMENT  OF FEEDING  JEJUNOSTOMY TUBE;  Surgeon: Excell Seltzer, MD;  Location: [facility];  Service: General;  Laterality: N/A;  . HEMORRHOIDECTOMY WITH HEMORRHOID BANDING    . IR FLUORO GUIDE CV LINE RIGHT  04/06/2017  . IR GENERIC HISTORICAL  05/19/2016   IR CM INJ ANY COLONIC TUBE W/FLUORO 05/19/2016 Aletta Edouard, MD [facility]  . IR US GUIDE VASC ACCESS RIGHT  04/06/2017  . j tube removed march 2018    . KNEE ARTHROSCOPY Left 09/06/2014  . LAPAROSCOPIC GASTRIC SLEEVE RESECTION N/A 01/14/2016   Procedure: LAPAROSCOPIC GASTRIC SLEEVE RESECTION;  Surgeon: Excell Seltzer, MD;  Location: [facility];  Service: General;  Laterality: N/A;  . LAPAROSCOPIC TUBAL LIGATION  10/16/2011   Procedure: LAPAROSCOPIC TUBAL LIGATION;  Surgeon: Alwyn Pea, MD;  Location: [facility];  Service: Gynecology;  Laterality: Bilateral;  . NOVASURE ABLATION  09/28/2010   mild persistent vaginal bleeding  . right knee arthroscopy     05/07/2016  . TUBAL LIGATION    . wisdom teeth extracted      OB History    Gravida Para Term Preterm AB Living   4 4 4     3    SAB TAB Ectopic Multiple Live Births                    Home Medications    Prior to Admission medications   Medication Sig Start Date End Date Taking? Authorizing Provider  albuterol (PROVENTIL HFA;VENTOLIN HFA) 108 (90 Base) MCG/ACT inhaler Inhale 2 puffs into the lungs every 6 (six) hours as needed for wheezing or shortness of breath. For shortness of breath 09/22/16   Wardell Honour, MD  Calcium-Vitamin A-Vitamin D (LIQUID CALCIUM PO) Take 500 mg by mouth 3 (three) times daily.    [provider]  cyanocobalamin (,VITAMIN B-12,) 1000 MCG/ML injection Inject 1,000 mcg into the muscle every 30 (thirty) days.    [provider]  diclofenac sodium (VOLTAREN) 1 %  GEL Apply 2 g topically daily at 3 pm. May use up to an additional 3 times daily as needed for pain    [provider]  EPINEPHrine (EPIPEN 2-PAK) 0.3 mg/0.3 mL IJ SOAJ injection Inject 0.3 mg into the muscle once as needed (for severe allergic reaction).    [provider]  famotidine (PEPCID) 20 MG tablet Take 1 tablet (20 mg total) by mouth 2 (two) times daily. 01/30/17   Lacretia Leigh, MD  famotidine (PEPCID) 40 MG tablet Take 40 mg by mouth 2 (two) times daily.    [provider]  fluticasone (FLONASE) 50 MCG/ACT nasal spray Place 1 spray into both nostrils daily as needed for rhinitis. Reported on 04/01/2016    [provider]  mirtazapine (REMERON SOL-TAB) 15 MG disintegrating tablet Take 1 tablet (15 mg total) by mouth at bedtime. 02/23/17   Wardell Honour, MD  Multiple Vitamins-Minerals (MULTIVITAMIN ADULT PO) Take 1 tablet by mouth 2 (two) times daily.     [provider]  ondansetron (ZOFRAN-ODT) 8 MG disintegrating tablet Take 1 tablet (8 mg total) by mouth every 8 (eight) hours as needed for nausea. Patient taking differently: Take 8 mg by mouth 2 (two) times daily as needed for nausea.  03/25/16   Wardell Honour, MD  pantoprazole (PROTONIX) 40 MG tablet Take 40 mg by mouth 2 (two) times daily.     [provider]  promethazine (PHENERGAN) 25 MG tablet Take 1 tablet (25 mg total) by mouth every 6 (six) hours as needed for nausea or vomiting. 01/31/17   Starr Lake, CNM  sucralfate (CARAFATE) 1 g tablet Take 1 tablet (1 g total) by mouth 4 (four) times daily. 01/30/17   Lacretia Leigh, MD  sulfamethoxazole-trimethoprim (BACTRIM DS,SEPTRA DS) 800-160 MG tablet Take 1 tablet by mouth 2 (two) times daily. Patient not taking: Reported on 03/26/2017 02/25/17   [name], MD  thiamine (VITAMIN B-1) 100 MG tablet Take 100 mg by mouth daily.     [provider]    Family History Family History  Problem Relation Age of Onset  . Hypertension Mother   . Diabetes Mother   . Sarcoidosis Mother        lungs and skin  . Asthma Mother   . Hypertension Father   . Diabetes Father   . Asthma Son   . Tics Son   . Asthma Sister   . Cancer Sister        possible pancreatic cancer  . Adrenal disorder Sister        Tumor   . Asthma Brother   . Asthma Daughter        died age 30.5  . Cancer Daughter 4       brain; died age 30.5  . Asthma Son   . Cancer Paternal Aunt        brain, colon, lung and esophagus; unsure of primary     Social History Social History  Substance Use Topics  . Smoking status: Former Smoker    Packs/day: 0.50    Years: 20.00    Types: Cigarettes    Quit date: 03/17/2014  . Smokeless tobacco: Never Used  . Alcohol use No     Allergies   Food; Morphine and related; Oxycodone; Ciprofloxacin; Doxycycline; Adhesive [tape]; and Penicillins   Review of Systems Review of Systems  Constitutional: Negative for fever.  HENT: Negative for congestion.   Eyes: Negative for visual disturbance.  Respiratory:  Negative for shortness of breath.   Cardiovascular: Negative for chest pain.  Gastrointestinal: Negative for abdominal pain.  Musculoskeletal: Negative for back pain.  Neurological: Positive for light-headedness. Negative for syncope and headaches.   Hematological: Does not bruise/bleed easily.  Psychiatric/Behavioral: Negative for confusion.     Physical Exam Updated Vital Signs BP 116/81   Pulse (!) 47   Temp 98.5 F (36.9 C) (Oral)   Resp 11   Ht 1.702 m (5' 7" )   Wt 75.8 kg (167 lb)   SpO2 100%   BMI 26.16 kg/m   Physical Exam  Constitutional: She is oriented to person, place, and time. She appears well-developed and well-nourished. No distress.  HENT:  Head: Normocephalic and atraumatic.  Mouth/Throat: Oropharynx is clear and moist.  Eyes: Conjunctivae and EOM are normal. Pupils are equal, round, and reactive to light.  Neck: Normal range of motion. Neck supple.  Cardiovascular: Regular rhythm.   Bradycardia  Pulmonary/Chest: Effort normal and breath sounds normal.  Abdominal: Soft. Bowel sounds are normal. There is no tenderness.  Musculoskeletal: Normal range of motion. She exhibits no edema.  Neurological: She is alert and oriented to person, place, and time. No cranial nerve deficit or sensory deficit. She exhibits normal muscle tone. Coordination normal.  Skin: Skin is warm.  Nursing note and vitals reviewed.    ED Treatments / Results  Labs (all labs ordered are listed, but only abnormal results are displayed) Labs Reviewed  BASIC METABOLIC PANEL - Abnormal; Notable for the following:       Result Value   CO2 19 (*)    All other components within normal limits  CBC - Abnormal; Notable for the following:    Hemoglobin 10.3 (*)    HCT 33.3 (*)    MCH 25.4 (*)    RDW 16.1 (*)    All other components within normal limits  I-STAT TROPOININ, ED    EKG  EKG Interpretation  Date/Time:  Tuesday April 06 2017 15:44:32 EDT Ventricular Rate:  59 PR Interval:  144 QRS Duration: 72 QT Interval:  436 QTC Calculation: 431 R Axis:   18 Text Interpretation:  Sinus bradycardia Cannot rule out Anterior infarct , age undetermined Abnormal ECG When compared with ECG of 03/24/2016, HEART RATE has decreased  Reconfirmed by Fredia Sorrow [phone]) on 04/06/2017 10:13:03 PM       Radiology Dg Chest 2 View  Result Date: 04/06/2017 CLINICAL DATA:  Chest pain. EXAM: CHEST  2 VIEW COMPARISON:  Radiographs of November 07, 2015. FINDINGS: The heart size and mediastinal contours are within normal limits. Both lungs are clear. No pneumothorax or pleural effusion is noted. Interval placement of right-sided PICC line with distal tip in expected position of the SVC. The visualized skeletal structures are unremarkable. IMPRESSION: No active cardiopulmonary disease. Electronically Signed   By: Marijo Conception, M.D.   On: 04/06/2017 16:44   Ir Fluoro Guide Cv Line Right  Result Date: 04/06/2017 CLINICAL DATA:  Severe dehydration and difficult peripheral IV access. EXAM: PICC LINE PLACEMENT WITH ULTRASOUND AND FLUOROSCOPIC GUIDANCE FLUOROSCOPY TIME:  6 seconds.  1.0 mGy. PROCEDURE: The patient was advised of the possible risks and complications and agreed to undergo the procedure. The patient was then brought to the angiographic suite for the procedure. A time-out was performed prior to initiating the procedure. The right arm was prepped with chlorhexidine, draped in the usual sterile fashion using maximum barrier technique (cap and mask, sterile gown, sterile gloves, large sterile sheet,  hand hygiene and cutaneous antisepsis) and infiltrated locally with 1% Lidocaine. Ultrasound demonstrated patency of the right brachial vein, and this was documented with an image. Under real-time ultrasound guidance, this vein was accessed with a 21 gauge micropuncture needle and image documentation was performed. A 0.018 wire was introduced in to the vein. Over this, a 5.0 [place] dual lumen power injectable PICC was advanced to the lower SVC/right atrial junction. Fluoroscopy during the procedure and fluoro spot radiograph confirms appropriate catheter position. The catheter was flushed and covered with a sterile dressing. Catheter length:  34 cm COMPLICATIONS: None IMPRESSION: Successful right arm dual lumen power injectable PICC line placement with ultrasound and fluoroscopic guidance. The catheter is ready for use. Electronically Signed   By: Aletta Edouard M.D.   On: 04/06/2017 15:16   Ir US Guide Vasc Access Right  Result Date: 04/06/2017 CLINICAL DATA:  Severe dehydration and difficult peripheral IV access. EXAM: PICC LINE PLACEMENT WITH ULTRASOUND AND FLUOROSCOPIC GUIDANCE FLUOROSCOPY TIME:  6 seconds.  1.0 mGy. PROCEDURE: The patient was advised of the possible risks and complications and agreed to undergo the procedure. The patient was then brought to the angiographic suite for the procedure. A time-out was performed prior to initiating the procedure. The right arm was prepped with chlorhexidine, draped in the usual sterile fashion using maximum barrier technique (cap and mask, sterile gown, sterile gloves, large sterile sheet, hand hygiene and cutaneous antisepsis) and infiltrated locally with 1% Lidocaine. Ultrasound demonstrated patency of the right brachial vein, and this was documented with an image. Under real-time ultrasound guidance, this vein was accessed with a 21 gauge micropuncture needle and image documentation was performed. A 0.018 wire was introduced in to the vein. Over this, a 5.0 [place] dual lumen power injectable PICC was advanced to the lower SVC/right atrial junction. Fluoroscopy during the procedure and fluoro spot radiograph confirms appropriate catheter position. The catheter was flushed and covered with a sterile dressing. Catheter length: 34 cm COMPLICATIONS: None IMPRESSION: Successful right arm dual lumen power injectable PICC line placement with ultrasound and fluoroscopic guidance. The catheter is ready for use. Electronically Signed   By: Aletta Edouard M.D.   On: 04/06/2017 15:16    Procedures Procedures (including critical care time)  Medications Ordered in ED Medications    HYDROcodone-acetaminophen (NORCO/VICODIN) 5-325 MG per tablet 1 tablet (1 tablet Oral Given 04/06/17 1847)  sodium chloride 0.9 % bolus 2,000 mL (2,000 mLs Intravenous New Bag/Given 04/06/17 2313)  promethazine (PHENERGAN) injection 12.5 mg (12.5 mg Intravenous Given 04/06/17 2347)     Initial Impression / Assessment and Plan / ED Course  I have reviewed the triage vital signs and the nursing notes.  Pertinent labs & imaging results that were available during my care of the patient were reviewed by me and considered in my medical decision making (see chart for details).     Patient with gastric sleeve complications. Followed closely by Dr. Excell Seltzer. Patient once a week it's a special IV nutrient infusion done at the sickle cell clinic. Patient does not have sickle cell. Patient had a PICC line placed today is a could not get an IV the other day. Patient was then referred here for this nutrient supplement by the PICC line nurse. However we do not have that available. Patient's electrolytes here today without any significant problems. Patient states that she's felt dizzy feels dehydrated. So patient given 2 L of normal saline. With improvement. Patient also given Phenergan. Patient has her nutrient IV  rescheduled for Thursday.  Patient states that she's had several times when she spell but there has been no true syncope. Workup for that here without any significant abnormalities.  Final Clinical Impressions(s) / ED Diagnoses   Final diagnoses:  Dehydration    New Prescriptions New Prescriptions   No medications on file     Fredia Sorrow, MD 04/07/17 [phone]

## 2017-04-07 NOTE — Discharge Instructions (Signed)
Follow back up with the sickle cell clinic for your normal nutrient IV of treatment. As per Dr. Excell Seltzer. Today's labs without any sniffing and Hilda Blades. Return for any new or worse symptoms

## 2017-04-08 ENCOUNTER — Other Ambulatory Visit: Payer: Self-pay | Admitting: General Surgery

## 2017-04-08 ENCOUNTER — Ambulatory Visit (HOSPITAL_COMMUNITY)
Admission: RE | Admit: 2017-04-08 | Discharge: 2017-04-08 | Disposition: A | Discharge status: Home / Self Care | Payer: 59 | Source: Ambulatory Visit | Attending: Family Medicine | Admitting: Family Medicine

## 2017-04-08 DIAGNOSIS — E860 Dehydration: Secondary | ICD-10-CM | POA: Diagnosis present

## 2017-04-08 MED ORDER — HEPARIN SOD (PORK) LOCK FLUSH 100 UNIT/ML IV SOLN
250.0000 [IU] | INTRAVENOUS | Status: AC | PRN
  Administered 2017-04-08: 250 [IU]
  Filled 2017-04-08: qty 5

## 2017-04-08 MED ORDER — ONDANSETRON HCL 4 MG/2ML IJ SOLN
4.0000 mg | INTRAMUSCULAR | Status: DC | PRN
  Administered 2017-04-08: 4 mg via INTRAVENOUS
  Filled 2017-04-08: qty 2

## 2017-04-08 MED ORDER — SODIUM CHLORIDE 0.9% FLUSH
10.0000 mL | INTRAVENOUS | Status: AC | PRN
  Administered 2017-04-08: 10 mL

## 2017-04-08 MED ORDER — ONDANSETRON 4 MG PO TBDP
4.0000 mg | ORAL_TABLET | ORAL | Status: DC | PRN
  Filled 2017-04-08: qty 1

## 2017-04-08 MED ORDER — THIAMINE HCL 100 MG/ML IJ SOLN
INTRAVENOUS | Status: AC
  Administered 2017-04-08: 11:00:00 via INTRAVENOUS
  Filled 2017-04-08: qty 1000

## 2017-04-08 MED ORDER — THIAMINE HCL 100 MG/ML IJ SOLN: INTRAVENOUS | Status: DC

## 2017-04-08 NOTE — Progress Notes (Signed)
Spoke with Elmo Putt from Dr. Excell Seltzer office.  Ok to run IV fluids at 250/hr or higher if patient can tolerate.

## 2017-04-08 NOTE — Discharge Instructions (Signed)
Patient received sodium chloride 0.9 % 1,000 mL with thiamine 346 mg, folic acid 1 mg, multivitamins adult 10 mL infusion on today 7/12.

## 2017-04-08 NOTE — Progress Notes (Signed)
Provider: Zannie Cove  Treatment: sodium chloride 0.9 % 1,000 mL with thiamine 338 mg, folic acid 1 mg, multivitamins adult 10 mL infusion    Patient tolerated procedure well. Discharge instructions given to patient and patient states an understanding. Patient alert, oriented and ambulatory at time of discharge.

## 2017-04-12 ENCOUNTER — Encounter (HOSPITAL_COMMUNITY): Payer: Self-pay

## 2017-04-14 ENCOUNTER — Ambulatory Visit (HOSPITAL_COMMUNITY)
Admission: RE | Admit: 2017-04-14 | Discharge: 2017-04-14 | Disposition: A | Discharge status: Home / Self Care | Payer: 59 | Source: Ambulatory Visit | Attending: Family Medicine | Admitting: Family Medicine

## 2017-04-14 DIAGNOSIS — E860 Dehydration: Secondary | ICD-10-CM | POA: Diagnosis not present

## 2017-04-14 MED ORDER — SODIUM CHLORIDE 0.9% FLUSH
10.0000 mL | INTRAVENOUS | Status: AC | PRN
  Administered 2017-04-14: 10 mL

## 2017-04-14 MED ORDER — HEPARIN SOD (PORK) LOCK FLUSH 100 UNIT/ML IV SOLN
250.0000 [IU] | INTRAVENOUS | Status: AC | PRN
  Administered 2017-04-14: 250 [IU]
  Filled 2017-04-14: qty 5

## 2017-04-14 MED ORDER — ONDANSETRON HCL 4 MG/2ML IJ SOLN
4.0000 mg | Freq: Once | INTRAMUSCULAR | Status: AC
  Administered 2017-04-14: 4 mg via INTRAVENOUS
  Filled 2017-04-14: qty 2

## 2017-04-14 MED ORDER — THIAMINE HCL 100 MG/ML IJ SOLN
Freq: Once | INTRAVENOUS | Status: AC
  Administered 2017-04-14: 10:00:00 via INTRAVENOUS
  Filled 2017-04-14: qty 1000

## 2017-04-14 NOTE — Discharge Instructions (Signed)
Pt received 1 banana bag today via PICC line. Also PICC dressing was changed.

## 2017-04-14 NOTE — Progress Notes (Signed)
Provider: Zannie Cove  Treatment: sodium chloride 0.9 % 1,000 mL with thiamine 924 mg, folic acid 1 mg, multivitamins adult 10 mL infusion    Patient tolerated procedure well. Discharge instructions given to patient and patient states an understanding. Patient alert, oriented and ambulatory at time of discharge.

## 2017-04-21 ENCOUNTER — Ambulatory Visit (HOSPITAL_COMMUNITY)
Admission: RE | Admit: 2017-04-21 | Discharge: 2017-04-21 | Disposition: A | Discharge status: Home / Self Care | Payer: 59 | Source: Ambulatory Visit | Attending: Family Medicine | Admitting: Family Medicine

## 2017-04-21 DIAGNOSIS — E860 Dehydration: Secondary | ICD-10-CM | POA: Diagnosis not present

## 2017-04-21 MED ORDER — DIPHENHYDRAMINE HCL 25 MG PO CAPS
50.0000 mg | ORAL_CAPSULE | Freq: Once | ORAL | Status: AC
  Administered 2017-04-21: 50 mg via ORAL

## 2017-04-21 MED ORDER — M.V.I. ADULT IV INJ
INJECTION | Freq: Once | INTRAVENOUS | Status: AC
  Administered 2017-04-21: 10:00:00 via INTRAVENOUS
  Filled 2017-04-21: qty 1000

## 2017-04-21 MED ORDER — SODIUM CHLORIDE 0.9% FLUSH
10.0000 mL | INTRAVENOUS | Status: AC | PRN
  Administered 2017-04-21: 10 mL

## 2017-04-21 MED ORDER — DIPHENHYDRAMINE HCL 25 MG PO CAPS
ORAL_CAPSULE | ORAL | Status: AC
  Administered 2017-04-21: 50 mg via ORAL
  Filled 2017-04-21: qty 2

## 2017-04-21 MED ORDER — HEPARIN SOD (PORK) LOCK FLUSH 100 UNIT/ML IV SOLN
250.0000 [IU] | INTRAVENOUS | Status: AC | PRN
  Administered 2017-04-21: 250 [IU]
  Filled 2017-04-21: qty 5

## 2017-04-21 NOTE — Discharge Instructions (Signed)

## 2017-04-21 NOTE — Progress Notes (Addendum)
Procedure: IV infusion of NS with thiamine, folic acid, and multivitamin additives; PICC line dressing change per protocol  Ordering Provider: Excell Seltzer, MD  Pt alert and oriented; ambulatory; PO benadryl given for itching  Diagnosis: Dehydration  Pt given discharge instructions; no complications noted

## 2017-04-21 NOTE — Progress Notes (Signed)
Pt receiving IV infusion of NS with thiamine, multivitamins, and folic acid; pt states feels itchy all over; no shortness of breath noted; no redness, swelling or rash noted; infusion stopped; Dr. Lear Ng office noted for additional orders; will continue to monitor

## 2017-04-28 ENCOUNTER — Ambulatory Visit (HOSPITAL_COMMUNITY)
Admission: RE | Admit: 2017-04-28 | Discharge: 2017-04-28 | Disposition: A | Discharge status: Home / Self Care | Payer: 59 | Source: Ambulatory Visit | Attending: Family Medicine | Admitting: Family Medicine

## 2017-04-28 DIAGNOSIS — E860 Dehydration: Secondary | ICD-10-CM | POA: Diagnosis not present

## 2017-04-28 DIAGNOSIS — Z4800 Encounter for change or removal of nonsurgical wound dressing: Secondary | ICD-10-CM | POA: Diagnosis present

## 2017-04-28 MED ORDER — ONDANSETRON 4 MG PO TBDP: 4.0000 mg | ORAL_TABLET | ORAL | Status: DC | PRN

## 2017-04-28 MED ORDER — ONDANSETRON HCL 4 MG/2ML IJ SOLN
4.0000 mg | INTRAMUSCULAR | Status: DC | PRN
  Administered 2017-04-28: 4 mg via INTRAVENOUS
  Filled 2017-04-28: qty 2

## 2017-04-28 MED ORDER — THIAMINE HCL 100 MG/ML IJ SOLN
Freq: Once | INTRAVENOUS | Status: AC
  Administered 2017-04-28: 10:00:00 via INTRAVENOUS
  Filled 2017-04-28: qty 1000

## 2017-04-28 MED ORDER — SODIUM CHLORIDE 0.9% FLUSH
10.0000 mL | INTRAVENOUS | Status: AC | PRN
  Administered 2017-04-28: 10 mL

## 2017-04-28 MED ORDER — HEPARIN SOD (PORK) LOCK FLUSH 100 UNIT/ML IV SOLN
500.0000 [IU] | INTRAVENOUS | Status: AC | PRN
  Administered 2017-04-28: 500 [IU]
  Filled 2017-04-28: qty 5

## 2017-04-28 MED ORDER — HEPARIN SOD (PORK) LOCK FLUSH 100 UNIT/ML IV SOLN: 250.0000 [IU] | INTRAVENOUS | Status: DC | PRN

## 2017-04-28 NOTE — Procedures (Signed)
Procedure: IV infusion of NS with thiamine, folic acid, and multivitamin additives; PICC line dressing change per protocol  Ordering Provider: Excell Seltzer, MD  Pt alert and oriented; ambulatory; PO benadryl given for itching  Diagnosis: Dehydration  Pt given discharge instructions; no complications notedProcedure: IV infusion of NS with thiamine, folic acid, and multivitamin additives; PICC line dressing change per protocol

## 2017-04-28 NOTE — Discharge Instructions (Signed)
You received 1Liter of IV fluids containing thiamine, folic acid and multivitamins.  PICC line dressing was changed, and line flushed without difficulty.

## 2017-05-05 ENCOUNTER — Ambulatory Visit (HOSPITAL_COMMUNITY)
Admission: RE | Admit: 2017-05-05 | Discharge: 2017-05-05 | Disposition: A | Discharge status: Home / Self Care | Payer: 59 | Source: Ambulatory Visit | Attending: General Surgery | Admitting: General Surgery

## 2017-05-05 DIAGNOSIS — Z4800 Encounter for change or removal of nonsurgical wound dressing: Secondary | ICD-10-CM | POA: Diagnosis not present

## 2017-05-05 DIAGNOSIS — E860 Dehydration: Secondary | ICD-10-CM

## 2017-05-05 MED ORDER — SODIUM CHLORIDE 0.9% FLUSH
10.0000 mL | INTRAVENOUS | Status: AC | PRN
  Administered 2017-05-05: 10 mL

## 2017-05-05 MED ORDER — THIAMINE HCL 100 MG/ML IJ SOLN
Freq: Once | INTRAVENOUS | Status: AC
  Administered 2017-05-05: 10:00:00 via INTRAVENOUS
  Filled 2017-05-05: qty 1000

## 2017-05-05 MED ORDER — HEPARIN SOD (PORK) LOCK FLUSH 100 UNIT/ML IV SOLN
250.0000 [IU] | INTRAVENOUS | Status: AC | PRN
  Administered 2017-05-05: 250 [IU]
  Filled 2017-05-05: qty 5

## 2017-05-05 NOTE — Discharge Instructions (Signed)
Patient received Sodium Chloride 0.9 % 1,000 mL with Thiamine 517 mg, Folic acid 1 mg, Multivitamins adult 10 mL infusion on today 8/8.

## 2017-05-05 NOTE — Progress Notes (Signed)
Provider: Zannie Cove  Treatment: sodium chloride 0.9 % 1,000 mL with thiamine 718 mg, folic acid 1 mg, multivitamins adult 10 mL infusion    Patient tolerated procedure well. Discharge instructions given to patient and patient states an understanding. Patient alert, oriented and ambulatory at time of discharge.

## 2017-05-10 ENCOUNTER — Ambulatory Visit: Payer: 59 | Admitting: Skilled Nursing Facility1

## 2017-05-12 ENCOUNTER — Ambulatory Visit (HOSPITAL_COMMUNITY)
Admission: RE | Admit: 2017-05-12 | Discharge: 2017-05-12 | Disposition: A | Discharge status: Home / Self Care | Payer: 59 | Source: Ambulatory Visit | Attending: General Surgery | Admitting: General Surgery

## 2017-05-12 DIAGNOSIS — Z4800 Encounter for change or removal of nonsurgical wound dressing: Secondary | ICD-10-CM | POA: Diagnosis not present

## 2017-05-12 MED ORDER — SODIUM CHLORIDE 0.9% FLUSH
10.0000 mL | INTRAVENOUS | Status: AC | PRN
  Administered 2017-05-12: 10 mL

## 2017-05-12 MED ORDER — HEPARIN SOD (PORK) LOCK FLUSH 100 UNIT/ML IV SOLN
250.0000 [IU] | INTRAVENOUS | Status: AC | PRN
  Administered 2017-05-12: 250 [IU]
  Filled 2017-05-12: qty 5

## 2017-05-12 MED ORDER — ONDANSETRON HCL 4 MG/2ML IJ SOLN: 4.0000 mg | Freq: Once | INTRAMUSCULAR | Status: DC | PRN

## 2017-05-12 MED ORDER — THIAMINE HCL 100 MG/ML IJ SOLN
Freq: Once | INTRAVENOUS | Status: AC
  Administered 2017-05-12: 11:00:00 via INTRAVENOUS
  Filled 2017-05-12: qty 1000

## 2017-05-12 NOTE — Discharge Instructions (Signed)
Pt received a banana bag today via picc line. Picc line dressing was changed and flushed.

## 2017-05-12 NOTE — Progress Notes (Signed)
Diagnosis: Dehydration   Ordering Provider: Excell Seltzer, MD   Procedure: IV infusion of NS with thiamine, folic acid, and multivitamin additives; PICC line dressing change per protocol    Pt tolerated infusion well. She was alert, oriented and ambulatory at discharge. D/C instructions given with verbal understanding.

## 2017-05-13 ENCOUNTER — Ambulatory Visit: Payer: 59 | Admitting: Skilled Nursing Facility1

## 2017-05-16 ENCOUNTER — Other Ambulatory Visit: Payer: Self-pay | Admitting: Surgery

## 2017-05-18 ENCOUNTER — Ambulatory Visit (INDEPENDENT_AMBULATORY_CARE_PROVIDER_SITE_OTHER): Payer: 59 | Admitting: Family Medicine

## 2017-05-18 ENCOUNTER — Encounter (HOSPITAL_COMMUNITY): Payer: Self-pay | Admitting: *Deleted

## 2017-05-18 DIAGNOSIS — E538 Deficiency of other specified B group vitamins: Secondary | ICD-10-CM

## 2017-05-18 NOTE — Progress Notes (Signed)
B12 injection visit.

## 2017-05-19 ENCOUNTER — Ambulatory Visit (HOSPITAL_COMMUNITY)
Admission: RE | Admit: 2017-05-19 | Discharge: 2017-05-19 | Disposition: A | Discharge status: Home / Self Care | Payer: 59 | Source: Ambulatory Visit | Attending: Family Medicine | Admitting: Family Medicine

## 2017-05-19 ENCOUNTER — Ambulatory Visit (INDEPENDENT_AMBULATORY_CARE_PROVIDER_SITE_OTHER): Payer: 59 | Admitting: Physician Assistant

## 2017-05-19 ENCOUNTER — Encounter: Payer: Self-pay | Admitting: Skilled Nursing Facility1

## 2017-05-19 ENCOUNTER — Encounter: Payer: 59 | Attending: Family Medicine | Admitting: Skilled Nursing Facility1

## 2017-05-19 ENCOUNTER — Encounter: Payer: Self-pay | Admitting: Physician Assistant

## 2017-05-19 VITALS — BP 114/70 | HR 71 | Temp 98.5°F | Resp 17 | Ht 66.5 in | Wt 170.6 lb

## 2017-05-19 DIAGNOSIS — E860 Dehydration: Secondary | ICD-10-CM

## 2017-05-19 DIAGNOSIS — K51511 Left sided colitis with rectal bleeding: Secondary | ICD-10-CM | POA: Diagnosis not present

## 2017-05-19 DIAGNOSIS — Z6830 Body mass index (BMI) 30.0-30.9, adult: Secondary | ICD-10-CM | POA: Diagnosis not present

## 2017-05-19 DIAGNOSIS — E46 Unspecified protein-calorie malnutrition: Secondary | ICD-10-CM

## 2017-05-19 DIAGNOSIS — N898 Other specified noninflammatory disorders of vagina: Secondary | ICD-10-CM | POA: Diagnosis not present

## 2017-05-19 DIAGNOSIS — Z713 Dietary counseling and surveillance: Secondary | ICD-10-CM | POA: Diagnosis present

## 2017-05-19 DIAGNOSIS — E669 Obesity, unspecified: Secondary | ICD-10-CM | POA: Diagnosis present

## 2017-05-19 DIAGNOSIS — D500 Iron deficiency anemia secondary to blood loss (chronic): Secondary | ICD-10-CM | POA: Diagnosis not present

## 2017-05-19 DIAGNOSIS — R112 Nausea with vomiting, unspecified: Secondary | ICD-10-CM

## 2017-05-19 DIAGNOSIS — Z23 Encounter for immunization: Secondary | ICD-10-CM

## 2017-05-19 DIAGNOSIS — Z9884 Bariatric surgery status: Secondary | ICD-10-CM | POA: Diagnosis present

## 2017-05-19 DIAGNOSIS — Z4800 Encounter for change or removal of nonsurgical wound dressing: Secondary | ICD-10-CM | POA: Diagnosis not present

## 2017-05-19 LAB — POCT URINALYSIS DIP (MANUAL ENTRY)
Bilirubin, UA: NEGATIVE
Blood, UA: NEGATIVE
Glucose, UA: NEGATIVE mg/dL
Ketones, POC UA: NEGATIVE mg/dL
LEUKOCYTES UA: NEGATIVE
NITRITE UA: NEGATIVE
PROTEIN UA: NEGATIVE mg/dL
Spec Grav, UA: 1.01 (ref 1.010–1.025)
UROBILINOGEN UA: 0.2 U/dL
pH, UA: 5.5 (ref 5.0–8.0)

## 2017-05-19 MED ORDER — HEPARIN SOD (PORK) LOCK FLUSH 100 UNIT/ML IV SOLN
250.0000 [IU] | INTRAVENOUS | Status: AC | PRN
  Administered 2017-05-19: 250 [IU]
  Filled 2017-05-19: qty 5

## 2017-05-19 MED ORDER — THIAMINE HCL 100 MG/ML IJ SOLN
Freq: Once | INTRAVENOUS | Status: AC
  Administered 2017-05-19: 11:00:00 via INTRAVENOUS
  Filled 2017-05-19: qty 1000

## 2017-05-19 MED ORDER — SODIUM CHLORIDE 0.9% FLUSH
10.0000 mL | INTRAVENOUS | Status: AC | PRN
  Administered 2017-05-19: 10 mL

## 2017-05-19 MED ORDER — FLUCONAZOLE 150 MG PO TABS: 150.0000 mg | ORAL_TABLET | Freq: Once | ORAL | 0 refills | Status: AC

## 2017-05-19 MED ORDER — ONDANSETRON 4 MG PO TBDP: 4.0000 mg | ORAL_TABLET | Freq: Once | ORAL | Status: DC

## 2017-05-19 NOTE — H&P (Signed)
[name]  Location: [facility] Patient #: [id number] DOB: 03-10-1976 Single / Language: [name] / Race: Black or African American Female  History of Present Illness   The patient is a 41 year old female presenting status-post bariatric surgery. She returns approximately 5 months following revision of sleeve gastrectomy (01/14/2016) to Roux-en-Y gastric bypass (12/13/2016) for persistent intolerance of oral intake. Also GERD. She had a feeding jejunostomy that was removed at the time of her gastric bypass. She unfortunately has had persistent difficulty with oral intake and nausea.. We did get an upper GI series that showed a small hiatal hernia but no obstruction. There was some slight irregularity in the upper pouch and ulcer was questioned.   Dr. Lucia Gaskins performed upper endoscopy (03/02/2017) which did show 2 small marginal ulcers. She remains on Carafate and Protonix which she is getting down.   She tolerates liquids moderately but really cannot eat any solid foods due to pain and vomiting. We did place a PICC line so she can get weekly hydration. This seems to help her dizziness. She states she still having vomiting and some pain after eating unchanged but may be is able to find more things that she can tolerate. Her weight actually has stabilized and is unchanged from last visit at 170 pounds. Constipation remains a major issue despite MiraLAX and Benefiber.  Past Medical History: 1.  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  Upper endoscopy - 04/04/2016 - [facility] 3.  Depression 4.  Chronic joint  5.  Ulcerative colitis             Last colonoscopy by Dr. Carlean Purl - 06/11/2014 - he saw segmental colitis   Medication History Lars Mage Spillers, CMA; 05/05/2017 5:27 PM) Medications Reconciled  Vitals ([place]; 05/05/2017 5:26 PM) 05/05/2017 5:26 PM Weight: 170.4 lb Height: 67in Body Surface Area: 1.89 m Body Mass Index: 26.69 kg/m  BP: 104/78  (Sitting, Left Arm, Standard)   Physical Exam  General: Appears in good spirits and does not appear ill. Abdomen: Soft without tenderness. No hernias. Extremities: No edema. Neurologic: Alert and fully oriented. Affect normal.  Assessment & Plan  1.  ACUTE MARGINAL ULCER (K28.3)  Impression: Marginal ulcer post-revision of sleeve gastrectomy to gastric bypass. Intolerance of solid foods. She is able to get her Carafate and Protonix down. Still describes significant symptoms of nausea and pain. However her weight has stabilized. She does not appear ill. Recent lab work was unremarkable. Continue weekly IV hydration for now. We will talk to Dr. Jacinto Reap about repeating her upper endoscopy to assess her marginal ulcers. I'm going to add Linzess for her chronic constipation unresponsive to other measures.  Current Plans  Follow up with [place] in the office in 1 month.  2.  Depression 3.  Chronic joint  4.  Ulcerative colitis   Alphonsa Overall, MD, [facility] Pager: [phone] Office phone:  [phone]

## 2017-05-19 NOTE — Progress Notes (Signed)
Patient ID: [name], female    DOB: 20-Jan-1976, 41 y.o.   MRN: [id number]  PCP: Wardell Honour, MD  Chief Complaint  Patient presents with  . ? uti/yeast    having discharge and back pain,onset: 1 week  . Labs Only    Subjective:   Presents for evaluation of 1 week of vaginal itching and discharge. In addition, her nutritionist recommended updated labs.  Scheduled for esophagogastroduodenoscopy tomorrow and hysterectomy on 06/08/2017.  Having urinary urgency, frequency and discharge, back pain. No fever, chills. No hematuria.  Is getting IV fluids weekly on Wednesdays due to chronic dehydration. She is chronically nauseated. Has LEFT sided colitis. S/p gastric sleeve.   Review of Systems As above.    Patient Active Problem List   Diagnosis Date Noted  . Dehydration 04/02/2017  . Malnutrition, calorie ([place]) 03/27/2017  . Psychophysiological insomnia 03/18/2017  . Nausea and vomiting 12/14/2016  . Paresthesia 07/22/2016  . Chronic headache disorder 07/22/2016  . Memory difficulty 07/22/2016  . Hematochezia 04/04/2016  . 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 Postoperative nausea and vomiting 01/21/2016  . Anemia, iron deficiency 10/01/2015  . Skin lesion 05/18/2015  . Anal fissure - posterior 10/16/2014  . Left sided chronic colitis - segmental 06/11/2014  . Internal and external bleeding hemorrhoids 06/11/2014  . Asthma 04/26/2012  . Excessive or frequent menstruation 10/16/2011  . DEPRESSION, MILD 10/02/2009     Prior to Admission medications   Medication Sig Start Date End Date Taking? Authorizing Provider  acetaminophen (TYLENOL) 500 MG tablet Take 500 mg by mouth every 6 (six) hours as needed (for pain.).   Yes [provider]  acetaminophen (TYLENOL) 650 MG CR tablet Take 650 mg by mouth every 6 (six) hours as needed for pain.    [provider]  albuterol (PROVENTIL HFA;VENTOLIN HFA) 108 (90 Base) MCG/ACT  inhaler Inhale 2 puffs into the lungs every 6 (six) hours as needed for wheezing or shortness of breath. For shortness of breath 09/22/16   Wardell Honour, MD  CALCIUM CITRATE PO Take 10 mLs by mouth daily.    [provider]  cyanocobalamin (,VITAMIN B-12,) 1000 MCG/ML injection Inject 1,000 mcg into the muscle every 30 (thirty) days.    [provider]  diclofenac sodium (VOLTAREN) 1 % GEL Apply 2 g topically 3 (three) times daily as needed (for knee pain). May use up to an additional 3 times daily as needed for pain    [provider]  EPINEPHrine (EPIPEN 2-PAK) 0.3 mg/0.3 mL IJ SOAJ injection Inject 0.3 mg into the muscle once as needed (for severe allergic reaction).    [provider]  famotidine (PEPCID) 40 MG tablet Take 40 mg by mouth 2 (two) times daily as needed (for heartburn/indigestion.).     [provider]  fluticasone (FLONASE) 50 MCG/ACT nasal spray Place 1 spray into both nostrils daily as needed for allergies or rhinitis. Reported on 04/01/2016    [provider]  linaclotide (LINZESS) 290 MCG CAPS capsule Take 290 mcg by mouth daily before breakfast.    [provider]  mirtazapine (REMERON SOL-TAB) 15 MG disintegrating tablet Take 1 tablet (15 mg total) by mouth at bedtime. Patient not taking: Reported on 05/18/2017 02/23/17   Wardell Honour, MD  Multiple Vitamins-Minerals (MULTIVITAMIN ADULT) CHEW Chew 1 tablet by mouth 2 (two) times daily.    [provider]  ondansetron (ZOFRAN-ODT) 8 MG disintegrating tablet Take 1 tablet (  8 mg total) by mouth every 8 (eight) hours as needed for nausea. Patient taking differently: Take 8 mg by mouth 2 (two) times daily as needed for nausea.  03/25/16   Wardell Honour, MD  pantoprazole (PROTONIX) 40 MG tablet Take 40 mg by mouth 2 (two) times daily.     [provider]  promethazine (PHENERGAN) 25 MG tablet Take 1 tablet (25 mg total) by mouth every 6 (six) hours as  needed for nausea or vomiting. 01/31/17   Starr Lake, CNM  sucralfate (CARAFATE) 1 g tablet Take 1 tablet (1 g total) by mouth 4 (four) times daily. 01/30/17   Lacretia Leigh, MD  sulfamethoxazole-trimethoprim (BACTRIM DS,SEPTRA DS) 800-160 MG tablet Take 1 tablet by mouth 2 (two) times daily. Patient not taking: Reported on 03/26/2017 02/25/17   [name], MD  thiamine (VITAMIN B-1) 100 MG tablet Take 100 mg by mouth daily.     [provider]  thiamine 952 mg, folic acid 1 mg, multivitamins adult 10 mL in sodium chloride 0.9 % 1,000 mL Inject 1,000 mLs into the vein every Wednesday. [facility]    [provider]     Allergies  Allergen Reactions  . Food Anaphylaxis, Itching and Other (See Comments)    Pt is allergic to coconut.   . Morphine And Related Anaphylaxis and Other (See Comments)    Pt states that she has tolerated Norco and Dilaudid.  Has rash  . Oxycodone Anaphylaxis  . Ciprofloxacin Itching and Rash  . Doxycycline Nausea And Vomiting  . Adhesive [Tape] Rash    And paper tape causes a rash  . Penicillins Rash and Other (See Comments)    Has patient had a PCN reaction causing immediate rash, facial/tongue/throat swelling, SOB or lightheadedness with hypotension: No Has patient had a PCN reaction causing severe rash involving mucus membranes or skin necrosis: No Has patient had a PCN reaction that required hospitalization No Has patient had a PCN reaction occurring within the last 10 years: No If all of the above answers are "NO", then may proceed with Cephalosporin use.       Objective:  Physical Exam  Constitutional: She is oriented to person, place, and time. She appears well-developed and well-nourished. She is active and cooperative. No distress.  BP 114/70 (BP Location: Right Arm, Patient Position: Sitting, Cuff Size: Normal)   Pulse 71   Temp 98.5 F (36.9 C) (Oral)   Resp 17   Ht 5' 6.5" (1.689 m)   Wt 170 lb  9.6 oz (77.4 kg)   SpO2 100%   BMI 27.12 kg/m Wt Readings from Last 3 Encounters: 05/19/17 : 170 lb 9.6 oz (77.4 kg) 05/19/17 : 168 lb 3.2 oz (76.3 kg) 04/06/17 : 167 lb (75.8 kg) Weight on day of gastric sleeve: 320 lbs Max weight 350 lbs  HENT:  Head: Normocephalic and atraumatic.  Right Ear: Hearing normal.  Left Ear: Hearing normal.  Eyes: Conjunctivae are normal. No scleral icterus.  Neck: Normal range of motion. Neck supple. No thyromegaly present.  Cardiovascular: Normal rate, regular rhythm and normal heart sounds.   Pulses:      Radial pulses are 2+ on the right side, and 2+ on the left side.  Pulmonary/Chest: Effort normal and breath sounds normal.  Lymphadenopathy:       Head (right side): No tonsillar, no preauricular, no posterior auricular and no occipital adenopathy present.       Head (left side):  No tonsillar, no preauricular, no posterior auricular and no occipital adenopathy present.    She has no cervical adenopathy.       Right: No supraclavicular adenopathy present.       Left: No supraclavicular adenopathy present.  Neurological: She is alert and oriented to person, place, and time. No sensory deficit.  Skin: Skin is warm, dry and intact. No rash noted. No cyanosis or erythema. Nails show no clubbing.  Psychiatric: She has a normal mood and affect. Her speech is normal and behavior is normal.    Results for orders placed or performed in visit on 05/19/17  POCT urinalysis dipstick  Result Value Ref Range   Color, UA yellow yellow   Clarity, UA clear clear   Glucose, UA negative negative mg/dL   Bilirubin, UA negative negative   Ketones, POC UA negative negative mg/dL   Spec Grav, UA 1.010 1.010 - 1.025   Blood, UA negative negative   pH, UA 5.5 5.0 - 8.0   Protein Ur, POC negative negative mg/dL   Urobilinogen, UA 0.2 0.2 or 1.0 E.U./dL   Nitrite, UA Negative Negative   Leukocytes, UA Negative Negative   Lab tech not available to perform microscopy  of vaginal preparation, so self-collected wet prep sent out.    Assessment & Plan:   1. Left sided colitis with rectal bleeding ([place]) 2. Nausea and vomiting, intractability of vomiting not specified, unspecified vomiting type COntinue evaluation with GI.  3. Iron deficiency anemia due to chronic blood loss - CBC with Differential/Platelet  4. Malnutrition, calorie ([facility]) - VITAMIN D 25 Hydroxy (Vit-D Deficiency, Fractures) - Iron, TIBC and Ferritin Panel - B12 and Folate Panel - Calcitriol (1,25 di-OH Vit D)  5. Dehydration Continue weekly IVF infusions. - POCT urinalysis dipstick - Comprehensive metabolic panel  6. Vaginal discharge No evidence of UTI. Suspect yeast vaginitis vs. BV, based on symptoms. Await wet prep results. Will treat accordingly. - WET PREP FOR TRICH, YEAST, CLUE - fluconazole (DIFLUCAN) 150 MG tablet; Take 1 tablet (150 mg total) by mouth once. Repeat if needed  Dispense: 2 tablet; Refill: 0  7. Need for influenza vaccination - Flu Vaccine QUAD 36+ mos IM    Return if symptoms worsen or fail to improve.   Fara Chute, PA-C [facility] [place]

## 2017-05-19 NOTE — Patient Instructions (Signed)
     IF you received an x-ray today, you will receive an invoice from [facility]. Please contact [facility] at [phone] with questions or concerns regarding your invoice.   IF you received labwork today, you will receive an invoice from [facility]. Please contact [facility] at [phone] with questions or concerns regarding your invoice.   Our billing staff will not be able to assist you with questions regarding bills from these companies.  You will be contacted with the lab results as soon as they are available. The fastest way to get your results is to activate your My Chart account. Instructions are located on the last page of this paperwork. If you have not heard from us regarding the results in 2 weeks, please contact this office.     

## 2017-05-19 NOTE — Patient Instructions (Addendum)
-  Try sugar free pudding   -Try unsweetened soy milk   -Try Chike protein powder    -Try the celebrate or bariatric advantage capsule : open it and dump it into something or try swallowing it  -Try those capsules

## 2017-05-19 NOTE — Progress Notes (Signed)
Provider: Zannie Cove  Treatment: sodium chloride 0.9 % 1,000 mL with thiamine 800 mg, folic acid 1 mg, multivitamins adult 10 mL infusion    Patient tolerated procedure well.PICC line dressing change per protocol. Discharge instructions given to patient and patient states an understanding. Patient alert, oriented and ambulatory at time of discharge.

## 2017-05-19 NOTE — Discharge Instructions (Signed)
Patient received sodium chloride 0.9 % 1,000 mL with thiamine 762 mg, folic acid 1 mg, multivitamins adult 10 mL infusion on today 8/22. Patient also had PICC line dressing changed.

## 2017-05-19 NOTE — Progress Notes (Signed)
  Post Op appt: Pt states she Does not like to leave the house due to self image/depression. Pt states she goes to the rehydration clinic once a week; Arives with a PICC line in her right arm been doing this for 5 weeks. Pt states she goes for an endoscopy tomorrow. Pt states she is able to have ice now. Pt states she is getting a hysterectomy in mid September. Pt states she sees Dr. Excell Seltzer every month. Pt states she still cannot get any food down. Pt states smelling food causes her to throw up. Pt is now having episodes of hearing loss. Pt is currently taking care of her mother after her surgery.   Nutritional Diagnosis:  Inadequate fluid intake As related to little to no po intake and continuous emesis.  As evidenced by PICC line placement.    02/17/2017 03/08/2017   BMI (kg/m^2) 28.1 27.4   Fat Mass (lbs) 71.8 64   Fat Free Mass (lbs) 105.2 111   Total Body Water (lbs) 75 79   Surgery date: 12/14/2016 Surgery type: RYGB Start weight at [facility]: 230 Weight today: 168.2 Weight change: 6.8  TANITA  BODY COMP RESULTS  12/29/2016 05/19/2017   BMI (kg/m^2) 31.1 26.7   Fat Mass (lbs) 86.4 64   Fat Free Mass (lbs) 109.4 104.2   Total Body Water (lbs) 78.8 74    24-hr recall: for the past 2 weeks going on 3 weeks B (AM): vitamins, protonix, pepcid hour later carafate with sips of water Snk (AM): nausea medicine with sips of water L (PM):  Snk (PM):  D (PM):  Snk (PM):   Fluid intake: Gatorade, limeade/blueberry aid (insuffiencient)  Estimated total protein intake: insufficient   Medications: See List Supplementation: none due to vomiting  Recent physical activity:  ADL's  Goals: -Try sugar free pudding -Try unsweetened soy milk -Try Chike protein powder -Try the celebrate or bariatric advantage capsule : open it and dump it into something or try swallowing it -Try those capsules you put your own vitamins in with a  Syringe

## 2017-05-20 ENCOUNTER — Ambulatory Visit (HOSPITAL_COMMUNITY): Payer: 59 | Admitting: Certified Registered Nurse Anesthetist

## 2017-05-20 ENCOUNTER — Encounter (HOSPITAL_COMMUNITY): Payer: Self-pay | Admitting: Certified Registered Nurse Anesthetist

## 2017-05-20 ENCOUNTER — Ambulatory Visit (HOSPITAL_COMMUNITY)
Admission: RE | Admit: 2017-05-20 | Discharge: 2017-05-20 | Disposition: A | Discharge status: Home / Self Care | Payer: 59 | Source: Ambulatory Visit | Attending: Surgery | Admitting: Surgery

## 2017-05-20 ENCOUNTER — Encounter (HOSPITAL_COMMUNITY)
Admission: RE | Disposition: A | Discharge status: Home / Self Care | Payer: Self-pay | Source: Ambulatory Visit | Attending: Surgery

## 2017-05-20 ENCOUNTER — Ambulatory Visit (HOSPITAL_COMMUNITY): Payer: 59

## 2017-05-20 DIAGNOSIS — F329 Major depressive disorder, single episode, unspecified: Secondary | ICD-10-CM | POA: Diagnosis not present

## 2017-05-20 DIAGNOSIS — D509 Iron deficiency anemia, unspecified: Secondary | ICD-10-CM | POA: Diagnosis not present

## 2017-05-20 DIAGNOSIS — Z9884 Bariatric surgery status: Secondary | ICD-10-CM | POA: Diagnosis not present

## 2017-05-20 DIAGNOSIS — K283 Acute gastrojejunal ulcer without hemorrhage or perforation: Secondary | ICD-10-CM | POA: Diagnosis not present

## 2017-05-20 DIAGNOSIS — Z452 Encounter for adjustment and management of vascular access device: Secondary | ICD-10-CM

## 2017-05-20 DIAGNOSIS — K5909 Other constipation: Secondary | ICD-10-CM | POA: Insufficient documentation

## 2017-05-20 DIAGNOSIS — J45909 Unspecified asthma, uncomplicated: Secondary | ICD-10-CM | POA: Diagnosis not present

## 2017-05-20 DIAGNOSIS — K5190 Ulcerative colitis, unspecified, without complications: Secondary | ICD-10-CM | POA: Diagnosis not present

## 2017-05-20 DIAGNOSIS — R1084 Generalized abdominal pain: Secondary | ICD-10-CM | POA: Diagnosis not present

## 2017-05-20 DIAGNOSIS — R110 Nausea: Secondary | ICD-10-CM | POA: Diagnosis not present

## 2017-05-20 HISTORY — PX: ESOPHAGOGASTRODUODENOSCOPY (EGD) WITH PROPOFOL: SHX5813

## 2017-05-20 LAB — COMPREHENSIVE METABOLIC PANEL
ALBUMIN: 4.4 g/dL (ref 3.5–5.5)
ALT: 9 IU/L (ref 0–32)
AST: 17 IU/L (ref 0–40)
Albumin/Globulin Ratio: 1.5 (ref 1.2–2.2)
Alkaline Phosphatase: 61 IU/L (ref 39–117)
BUN / CREAT RATIO: 13 (ref 9–23)
BUN: 11 mg/dL (ref 6–20)
Bilirubin Total: 0.2 mg/dL (ref 0.0–1.2)
CALCIUM: 9.6 mg/dL (ref 8.7–10.2)
CO2: 20 mmol/L (ref 20–29)
CREATININE: 0.88 mg/dL (ref 0.57–1.00)
Chloride: 107 mmol/L — ABNORMAL HIGH (ref 96–106)
GFR calc Af Amer: 96 mL/min/{1.73_m2} (ref >59)
GFR, EST NON AFRICAN AMERICAN: 83 mL/min/{1.73_m2} (ref >59)
GLOBULIN, TOTAL: 3 g/dL (ref 1.5–4.5)
Glucose: 78 mg/dL (ref 65–99)
Potassium: 4.6 mmol/L (ref 3.5–5.2)
SODIUM: 140 mmol/L (ref 134–144)
Total Protein: 7.4 g/dL (ref 6.0–8.5)

## 2017-05-20 LAB — VITAMIN D 25 HYDROXY (VIT D DEFICIENCY, FRACTURES): Vit D, 25-Hydroxy: 18.8 ng/mL — ABNORMAL LOW (ref 30.0–100.0)

## 2017-05-20 LAB — CBC WITH DIFFERENTIAL/PLATELET
Basophils Absolute: 0 10*3/uL (ref 0.0–0.2)
Basos: 0 %
EOS (ABSOLUTE): 0.3 10*3/uL (ref 0.0–0.4)
EOS: 5 %
HEMATOCRIT: 34.5 % (ref 34.0–46.6)
HEMOGLOBIN: 10.4 g/dL — AB (ref 11.1–15.9)
IMMATURE GRANS (ABS): 0 10*3/uL (ref 0.0–0.1)
IMMATURE GRANULOCYTES: 0 %
Lymphocytes Absolute: 2.6 10*3/uL (ref 0.7–3.1)
Lymphs: 45 %
MCH: 25 pg — ABNORMAL LOW (ref 26.6–33.0)
MCHC: 30.1 g/dL — ABNORMAL LOW (ref 31.5–35.7)
MCV: 83 fL (ref 79–97)
MONOCYTES: 4 %
Monocytes Absolute: 0.2 10*3/uL (ref 0.1–0.9)
NEUTROS PCT: 46 %
Neutrophils Absolute: 2.7 10*3/uL (ref 1.4–7.0)
Platelets: 342 10*3/uL (ref 150–379)
RBC: 4.16 x10E6/uL (ref 3.77–5.28)
RDW: 17.9 % — ABNORMAL HIGH (ref 12.3–15.4)
WBC: 5.8 10*3/uL (ref 3.4–10.8)

## 2017-05-20 LAB — B12 AND FOLATE PANEL
FOLATE: 8.2 ng/mL (ref >3.0)
Vitamin B-12: 1887 pg/mL — ABNORMAL HIGH (ref 232–1245)

## 2017-05-20 LAB — WET PREP FOR TRICH, YEAST, CLUE
Clue Cell Exam: POSITIVE — AB
Trichomonas Exam: NEGATIVE
Yeast Exam: POSITIVE — AB

## 2017-05-20 LAB — IRON,TIBC AND FERRITIN PANEL
FERRITIN: 11 ng/mL — AB (ref 15–150)
IRON SATURATION: 14 % — AB (ref 15–55)
IRON: 54 ug/dL (ref 27–159)
TIBC: 382 ug/dL (ref 250–450)
UIBC: 328 ug/dL (ref 131–425)

## 2017-05-20 LAB — CALCITRIOL (1,25 DI-OH VIT D): VIT D 1 25 DIHYDROXY: 50.5 pg/mL (ref 19.9–79.3)

## 2017-05-20 SURGERY — ESOPHAGOGASTRODUODENOSCOPY (EGD) WITH PROPOFOL
Anesthesia: Monitor Anesthesia Care

## 2017-05-20 MED ORDER — METRONIDAZOLE 500 MG PO TABS: 500.0000 mg | ORAL_TABLET | Freq: Two times a day (BID) | ORAL | 0 refills | Status: DC

## 2017-05-20 MED ORDER — PROPOFOL 10 MG/ML IV BOLUS
INTRAVENOUS | Status: AC
  Filled 2017-05-20: qty 40

## 2017-05-20 MED ORDER — PROPOFOL 10 MG/ML IV BOLUS
INTRAVENOUS | Status: AC
Start: 2017-05-20 — End: 2017-05-20
  Filled 2017-05-20: qty 20

## 2017-05-20 MED ORDER — SODIUM CHLORIDE 0.9 % IV SOLN: INTRAVENOUS | Status: DC

## 2017-05-20 MED ORDER — LACTATED RINGERS IV SOLN
INTRAVENOUS | Status: DC
  Administered 2017-05-20 (×2): via INTRAVENOUS

## 2017-05-20 MED ORDER — HEPARIN SOD (PORK) LOCK FLUSH 100 UNIT/ML IV SOLN
250.0000 [IU] | INTRAVENOUS | Status: AC | PRN
  Administered 2017-05-20: 250 [IU]

## 2017-05-20 MED ORDER — PROPOFOL 500 MG/50ML IV EMUL
INTRAVENOUS | Status: DC | PRN
  Administered 2017-05-20: 125 ug/kg/min via INTRAVENOUS

## 2017-05-20 MED ORDER — SCOPOLAMINE 1 MG/3DAYS TD PT72
1.0000 | MEDICATED_PATCH | TRANSDERMAL | Status: DC
  Administered 2017-05-20: 1.5 mg via TRANSDERMAL

## 2017-05-20 MED ORDER — PROPOFOL 10 MG/ML IV BOLUS
INTRAVENOUS | Status: DC | PRN
  Administered 2017-05-20: 50 mg via INTRAVENOUS

## 2017-05-20 MED ORDER — SCOPOLAMINE 1 MG/3DAYS TD PT72
MEDICATED_PATCH | TRANSDERMAL | Status: AC
  Filled 2017-05-20: qty 1

## 2017-05-20 MED ORDER — LIDOCAINE 2% (20 MG/ML) 5 ML SYRINGE
INTRAMUSCULAR | Status: AC
  Filled 2017-05-20: qty 5

## 2017-05-20 MED ORDER — FLUCONAZOLE 150 MG PO TABS: 150.0000 mg | ORAL_TABLET | Freq: Once | ORAL | 0 refills | Status: AC

## 2017-05-20 MED ORDER — ONDANSETRON HCL 4 MG/2ML IJ SOLN
INTRAMUSCULAR | Status: AC
Start: 2017-05-20 — End: 2017-05-20
  Filled 2017-05-20: qty 2

## 2017-05-20 SURGICAL SUPPLY — 15 items

## 2017-05-20 NOTE — Transfer of Care (Signed)
Immediate Anesthesia Transfer of Care Note  Patient: SHARNAY CASHION  Procedure(s) Performed: Procedure(s): ESOPHAGOGASTRODUODENOSCOPY (EGD) WITH PROPOFOL ERAS PATHWAY (N/A)  Patient Location: Endoscopy Unit  Anesthesia Type:MAC  Level of Consciousness: awake, alert  and oriented  Airway & Oxygen Therapy: Patient Spontanous Breathing  Post-op Assessment: Report given to RN and Post -op Vital signs reviewed and stable  Post vital signs: Reviewed and stable  Last Vitals:  Vitals:   05/20/17 0700 05/20/17 0826  BP: 124/90   Pulse: 82   Resp: 13   Temp: 36.9 C 36.7 C  SpO2: 100%     Last Pain:  Vitals:   05/20/17 0700  TempSrc: Oral         Complications: No apparent anesthesia complications

## 2017-05-20 NOTE — Anesthesia Postprocedure Evaluation (Signed)
Anesthesia Post Note  Patient: [name]  Procedure(s) Performed: Procedure(s) (LRB): ESOPHAGOGASTRODUODENOSCOPY (EGD) WITH PROPOFOL ERAS PATHWAY (N/A)     Patient location during evaluation: Endoscopy Anesthesia Type: MAC Level of consciousness: awake and alert Pain management: pain level controlled Vital Signs Assessment: post-procedure vital signs reviewed and stable Respiratory status: spontaneous breathing, nonlabored ventilation, respiratory function stable and patient connected to nasal cannula oxygen Cardiovascular status: stable and blood pressure returned to baseline Anesthetic complications: no    Last Vitals:  Vitals:   05/20/17 0850 05/20/17 0900  BP: 119/72 123/77  Pulse: 63 62  Resp: 13 15  Temp:    SpO2: 100% 100%    Last Pain:  Vitals:   05/20/17 0826  TempSrc: Oral                 Farryn Linares,JAMES TERRILL

## 2017-05-20 NOTE — Op Note (Signed)
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  ANESTHESIA:   Propofol by anesthesia.  INDICATIONS FOR PROCEDURE:  [name] is a 41 y.o. (DOB: 18-Jun-1976)  AA female whose primary care physician is Wardell Honour, MD and comes for upper endoscopy to evaluate nausea and abdominal pain.  The patient originally had a sleeve gastrectomy (01/14/2016), revised to a RYGB on 12/14/2016 by Dr. Excell Seltzer.   The indications and risks of the endoscopy were explained to the patient.  The risks include, but are not limited to, perforation, bleeding, or injury to the bowel.  If balloon dilatation is needed, the risk of perforation is higher. 1 PROCEDURE:  The patient was in room 1 at [facility] endoscopy unit.  The patient was monitored with a pulse oximetry, BP cuff, and EKG.  The patient had anesthesia by nurse anesthesia.   A time was held prior to the procedure.   The patient was positioned in the left lateral decubitus position.  The patient was given Fentanyl and Versed.  A flexible Pentax endoscope was passed down the throat without difficulty.   Findings include:   Esophagus:   Normal.  No esophagitis.   GE junction at:  37 cm   Stomach pouch: Normal   Gastrojejunal anastomosis:   47 cm.  She has superficial ulcers on the jejunal side of the anastomosis.  I think the are more superficial than last exam, but not necessarily smaller.  The anastomosis is about 2 cm open and the scope easily went through the anastomosis.   Efferent jejunal limb:  I went about 8 cm - normal. Afferent jejunal limb:  I went about 20 cm - normal   CLO test:  Yes  PLAN:   Photos taken and given to patient.    Continue Protonix and carafate.  See Dr. Excell Seltzer in  follow up.  Alphonsa Overall, MD, [facility] Surgery Pager: [phone] Office phone:  [phone]

## 2017-05-20 NOTE — Progress Notes (Signed)
05-20-17  0920;   IV Team Note:   Asked to cap off picc line after procedure; pt also requested dressing be changed as "it got wet in the shower this am;"  Dressing changed; catheter noted to be out approx 2 cms;  Pt then stated "It was out almost 6 " two weeks ago, and the nurse at the [facility] center pushed it back in;"  Pt going to have a CXR done to verify placement;  Pt instructed on signs, symptoms of infection and told to call DR ASAP if any symptoms develop.    RN also notified in Endoscopy center.   Raynelle Fanning RN  IV Team

## 2017-05-20 NOTE — Interval H&P Note (Signed)
History and Physical Interval Note:  05/20/2017 8:06 AM  [name]  has presented today for surgery, with the diagnosis of history of gastric bypass with marginal ulcer  The various methods of treatment have been discussed with the patient and family.  Mother, Vertel, at bedside.  After consideration of risks, benefits and other options for treatment, the patient has consented to  Procedure(s): ESOPHAGOGASTRODUODENOSCOPY (EGD) WITH PROPOFOL ERAS PATHWAY (N/A) as a surgical intervention .  The patient's history has been reviewed, patient examined, no change in status, stable for surgery.  I have reviewed the patient's chart and labs.  Questions were answered to the patient's satisfaction.     [name]

## 2017-05-20 NOTE — Anesthesia Preprocedure Evaluation (Addendum)
Anesthesia Evaluation  Patient identified by MRN, date of birth, ID band Patient awake    History of Anesthesia Complications (+) PONV  Airway Mallampati: II   Neck ROM: Full    Dental no notable dental hx.    Pulmonary asthma , former smoker,    breath sounds clear to auscultation       Cardiovascular  Rhythm:Regular Rate:Normal     Neuro/Psych    GI/Hepatic hiatal hernia, PUD, GERD  ,  Endo/Other    Renal/GU      Musculoskeletal  (+) Arthritis ,   Abdominal   Peds  Hematology   Anesthesia Other Findings   Reproductive/Obstetrics                                                             Anesthesia Evaluation  Patient identified by MRN, date of birth, ID band Patient awake    Reviewed: Allergy & Precautions, NPO status , Patient's Chart, lab work & pertinent test results  History of Anesthesia Complications (+) PONV  Airway Mallampati: II  TM Distance: >3 FB Neck ROM: Full    Dental no notable dental hx.    Pulmonary asthma , former smoker,    Pulmonary exam normal breath sounds clear to auscultation       Cardiovascular negative cardio ROS Normal cardiovascular exam Rhythm:Regular Rate:Normal     Neuro/Psych negative neurological ROS  negative psych ROS   GI/Hepatic Neg liver ROS, hiatal hernia, PUD, GERD  ,UC   Endo/Other  negative endocrine ROS  Renal/GU negative Renal ROS  negative genitourinary   Musculoskeletal negative musculoskeletal ROS (+)   Abdominal   Peds negative pediatric ROS (+)  Hematology negative hematology ROS (+)   Anesthesia Other Findings   Reproductive/Obstetrics negative OB ROS                             Anesthesia Physical Anesthesia Plan  ASA: II  Anesthesia Plan: MAC   Post-op Pain Management:    Induction:   PONV Risk Score and Plan: 3 and Ondansetron and Propofol  Airway  Management Planned:   Additional Equipment:   Intra-op Plan:   Post-operative Plan:   Informed Consent: I have reviewed the patients History and Physical, chart, labs and discussed the procedure including the risks, benefits and alternatives for the proposed anesthesia with the patient or authorized representative who has indicated his/her understanding and acceptance.   Dental advisory given  Plan Discussed with:   Anesthesia Plan Comments:         Anesthesia Quick Evaluation  Anesthesia Physical Anesthesia Plan  ASA: III  Anesthesia Plan: MAC   Post-op Pain Management:    Induction:   PONV Risk Score and Plan: 2 and 3 and Ondansetron, Dexamethasone and Treatment may vary due to age or medical condition  Airway Management Planned: Simple Face Mask and Natural Airway  Additional Equipment:   Intra-op Plan:   Post-operative Plan:   Informed Consent: I have reviewed the patients History and Physical, chart, labs and discussed the procedure including the risks, benefits and alternatives for the proposed anesthesia with the patient or authorized representative who has indicated his/her understanding and acceptance.   Dental advisory given  Plan Discussed with:   Anesthesia  Plan Comments:         Anesthesia Quick Evaluation

## 2017-05-20 NOTE — Discharge Instructions (Signed)
Esophagogastroduodenoscopy Esophagogastroduodenoscopy (EGD) is a procedure to examine the lining of the esophagus, stomach, and first part of the small intestine (duodenum). This procedure is done to check for problems such as inflammation, bleeding, ulcers, or growths. During this procedure, a long, flexible, lighted tube with a camera attached (endoscope) is inserted down the throat. Tell a health care provider about:  Any allergies you have.  All medicines you are taking, including vitamins, herbs, eye drops, creams, and over-the-counter medicines.  Any problems you or family members have had with anesthetic medicines.  Any blood disorders you have.  Any surgeries you have had.  Any medical conditions you have.  Whether you are pregnant or may be pregnant. What are the risks? Generally, this is a safe procedure. However, problems may occur, including:  Infection.  Bleeding.  A tear (perforation) in the esophagus, stomach, or duodenum.  Trouble breathing.  Excessive sweating.  Spasms of the larynx.  A slowed heartbeat.  Low blood pressure.  What happens before the procedure?  Follow instructions from your health care provider about eating or drinking restrictions.  Ask your health care provider about: ? Changing or stopping your regular medicines. This is especially important if you are taking diabetes medicines or blood thinners. ? Taking medicines such as aspirin and ibuprofen. These medicines can thin your blood. Do not take these medicines before your procedure if your health care provider instructs you not to.  Plan to have someone take you home after the procedure.  If you wear dentures, be ready to remove them before the procedure. What happens during the procedure?  To reduce your risk of infection, your health care team will wash or sanitize their hands.  An IV tube will be put in a vein in your hand or arm. You will get medicines and fluids through this  tube.  You will be given one or more of the following: ? A medicine to help you relax (sedative). ? A medicine to numb the area (local anesthetic). This medicine may be sprayed into your throat. It will make you feel more comfortable and keep you from gagging or coughing during the procedure. ? A medicine for pain.  A mouth guard may be placed in your mouth to protect your teeth and to keep you from biting on the endoscope.  You will be asked to lie on your left side.  The endoscope will be lowered down your throat into your esophagus, stomach, and duodenum.  Air will be put into the endoscope. This will help your health care provider see better.  The lining of your esophagus, stomach, and duodenum will be examined.  Your health care provider may: ? Take a tissue sample so it can be looked at in a lab (biopsy). ? Remove growths. ? Remove objects (foreign bodies) that are stuck. ? Treat any bleeding with medicines or other devices that stop tissue from bleeding. ? Widen (dilate) or stretch narrowed areas of your esophagus and stomach.  The endoscope will be taken out. The procedure may vary among health care providers and hospitals. What happens after the procedure?  Your blood pressure, heart rate, breathing rate, and blood oxygen level will be monitored often until the medicines you were given have worn off.  Do not eat or drink anything until the numbing medicine has worn off and your gag reflex has returned. This information is not intended to replace advice given to you by your health care provider. Make sure you discuss any questions you  have with your health care provider. Document Released: 01/15/2005 Document Revised: 02/20/2016 Document Reviewed: 08/08/2015 Elsevier Interactive Patient Education  [employer].

## 2017-05-21 ENCOUNTER — Ambulatory Visit: Payer: 59 | Admitting: Family Medicine

## 2017-05-21 ENCOUNTER — Encounter (HOSPITAL_COMMUNITY): Payer: Self-pay | Admitting: Surgery

## 2017-05-21 LAB — CLOTEST (H. PYLORI), BIOPSY: HELICOBACTER SCREEN: NEGATIVE

## 2017-05-25 NOTE — H&P (Deleted)
[name] is an 41 y.o. [phone] female.   Chief Complaint: Heavy menstrual bleeding LKG:MWNUUVO has heavy bleeding. She has a significant h/o malnutrition and significant weight loss due to gastric bypass and persistent N/V. Previously had BTL and Novasure due to Mirena causing cramping and bleeding. On ultrasound, uterus measures 9.5 x 5.1 x 5.5 and has findings consistent with adenomyosis. She has a h/o SVD x 4.   Past Medical History:  Diagnosis Date  . Anal fissure - posterior 10/16/2014  . Anxiety    doesn't take anything  . Asthma    has Albuterol inhaler as needed  . Boil    on pubic area started septra 03-01-17 draining blood and pus  . Bronchitis   . Chronic headache disorder 07/22/2016  . Clostridium difficile infection 04/20/2012  . Colitis   . Dehydration   . Depression    doesn't take any meds  . Family history of adverse reaction to anesthesia    pt mom gets sick  . GERD (gastroesophageal reflux disease)    takes Pantoprazole daily  . Headache   . Hiatal hernia   . History of blood transfusion    last transfusion was 04/04/2016=Benadryl was given d/t itching. States she always itches with transfusion.   Marland Kitchen History of bronchitis    > 2 yrs ago  . History of colon polyps    benign  . History of migraine    last one 05/01/16  . History of urinary tract infection   . IDA (iron deficiency anemia)   . Internal and external bleeding hemorrhoids 06/11/2014  . Joint pain   . Joint swelling   . Left sided chronic colitis - segmental 06/11/2014  . Migraine   . Motion sickness   . Nausea    takes Zofran as needed  . Nausea and vomiting    for 1 year  . Obesity   . Oligouria   . Osteoarthritis    back  . Pneumonia 1997  . Postoperative nausea and vomiting 01/21/2016   wants scopolamine patch  . TOBACCO USER 10/02/2009   Qualifier: Diagnosis of  By: Dimas Millin MD, Ellard Artis    . Transfusion history    last transfusion 6'16   . UC (ulcerative colitis) ([place])    supposed to be  taking Lialda and Bentyl but has been off since gastric sleeve  . Ulcerative colitis ([place])   . Vertigo    doesn't take any meds    Past Surgical History:  Procedure Laterality Date  . COLONOSCOPY  2007   for rectal bleeding; [facility]  . COLONOSCOPY N/A 06/11/2014   Procedure: COLONOSCOPY;  Surgeon: Gatha Mayer, MD;  Location: [facility];  Service: Endoscopy;  Laterality: N/A;  . COLONOSCOPY WITH PROPOFOL N/A 03/02/2017   Procedure: COLONOSCOPY WITH PROPOFOL;  Surgeon: Alphonsa Overall, MD;  Location: [facility];  Service: General;  Laterality: N/A;  . DILATION AND CURETTAGE OF UTERUS    . ESOPHAGOGASTRODUODENOSCOPY (EGD) WITH PROPOFOL N/A 04/06/2016   Procedure: ESOPHAGOGASTRODUODENOSCOPY (EGD) WITH PROPOFOL;  Surgeon: Alphonsa Overall, MD;  Location: [facility];  Service: General;  Laterality: N/A;  . ESOPHAGOGASTRODUODENOSCOPY (EGD) WITH PROPOFOL N/A 03/02/2017   Procedure: ESOPHAGOGASTRODUODENOSCOPY (EGD) WITH PROPOFOL;  Surgeon: Alphonsa Overall, MD;  Location: [facility];  Service: General;  Laterality: N/A;  . ESOPHAGOGASTRODUODENOSCOPY (EGD) WITH PROPOFOL N/A 05/20/2017   Procedure: ESOPHAGOGASTRODUODENOSCOPY (EGD) WITH PROPOFOL ERAS PATHWAY;  Surgeon: Alphonsa Overall, MD;  Location: [facility];  Service: General;  Laterality: N/A;  . FOOT  SURGERY Bilateral    x 2  . GASTRIC BYPASS    . GASTRIC ROUX-EN-Y N/A 12/14/2016   Procedure: LAPAROSCOPIC REVISION SLEEVE GASTRECTOMY TO  ROUX-Y-GASTRIC BY-PASS, UPPER ENDO;  Surgeon: Excell Seltzer, MD;  Location: [facility];  Service: General;  Laterality: N/A;  . GASTROJEJUNOSTOMY N/A 05/11/2016   Procedure: LAPAROSCOPIC PLACEMENT  OF FEEDING  JEJUNOSTOMY TUBE;  Surgeon: Excell Seltzer, MD;  Location: [facility];  Service: General;  Laterality: N/A;  . HEMORRHOIDECTOMY WITH HEMORRHOID BANDING    . IR FLUORO GUIDE CV LINE RIGHT  04/06/2017  . IR GENERIC HISTORICAL  05/19/2016   IR CM INJ ANY COLONIC TUBE W/FLUORO 05/19/2016 Aletta Edouard, MD [facility]  . IR US GUIDE VASC ACCESS RIGHT  04/06/2017  . j tube removed march 2018    . KNEE ARTHROSCOPY Left 09/06/2014  . LAPAROSCOPIC GASTRIC SLEEVE RESECTION N/A 01/14/2016   Procedure: LAPAROSCOPIC GASTRIC SLEEVE RESECTION;  Surgeon: Excell Seltzer, MD;  Location: [facility];  Service: General;  Laterality: N/A;  . LAPAROSCOPIC TUBAL LIGATION  10/16/2011   Procedure: LAPAROSCOPIC TUBAL LIGATION;  Surgeon: Alwyn Pea, MD;  Location: [facility];  Service: Gynecology;  Laterality: Bilateral;  . NOVASURE ABLATION  09/28/2010   mild persistent vaginal bleeding  . right knee arthroscopy     05/07/2016  . TUBAL LIGATION    . wisdom teeth extracted      Family History  Problem Relation Age of Onset  . Hypertension Mother   . Diabetes Mother   . Sarcoidosis Mother        lungs and skin  . Asthma Mother   . Hypertension Father   . Diabetes Father   . Asthma Son   . Tics Son   . Asthma Sister   . Cancer Sister        possible pancreatic cancer  . Adrenal disorder Sister        Tumor   . Asthma Brother   . Asthma Daughter        died age 28.5  . Cancer Daughter 4       brain; died age 28.5  . Asthma Son   . Cancer Paternal Aunt        brain, colon, lung and esophagus; unsure of primary    Social History:  reports that she quit smoking about 3 years ago. Her smoking use included Cigarettes. She has a 10.00 pack-year smoking history. She has never used smokeless tobacco. She reports that she does not drink alcohol or use drugs.  Allergies:  Allergies  Allergen Reactions  . Food Anaphylaxis, Itching and Other (See Comments)    Pt is allergic to coconut.   . Morphine And Related Anaphylaxis and Other (See Comments)    Pt states that she has tolerated Norco and Dilaudid.  Has rash  . Oxycodone Anaphylaxis  . Ciprofloxacin Itching and Rash  . Doxycycline Nausea And Vomiting  . Adhesive [Tape] Rash    And paper tape causes a rash  . Penicillins Rash and Other (See Comments)    Has  patient had a PCN reaction causing immediate rash, facial/tongue/throat swelling, SOB or lightheadedness with hypotension: No Has patient had a PCN reaction causing severe rash involving mucus membranes or skin necrosis: No Has patient had a PCN reaction that required hospitalization No Has patient had a PCN reaction occurring within the last 10 years: No If all of the above answers are "NO", then may proceed with Cephalosporin use.    Current  Facility-Administered Medications on File Prior to Encounter  Medication Dose Route Frequency Provider Last Rate Last Dose  . cyanocobalamin ((VITAMIN B-12)) injection 1,000 mcg  1,000 mcg Intramuscular Q30 days Wardell Honour, MD   1,000 mcg at 05/18/17 1033  . ondansetron (ZOFRAN-ODT) disintegrating tablet 4 mg  4 mg Oral Q4H PRN Excell Seltzer, MD       Or  . ondansetron (ZOFRAN) 4 mg in sodium chloride 0.9 % 50 mL IVPB  4 mg Intravenous Q4H PRN Excell Seltzer, MD   4 mg at 05/20/17 0813  . sodium chloride 0.9 % 1,000 mL with thiamine 767 mg, folic acid 1 mg, multivitamins adult 10 mL infusion   Intravenous Once Excell Seltzer, MD      . sodium chloride 0.9 % 1,000 mL with thiamine 209 mg, folic acid 1 mg, multivitamins adult 10 mL infusion   Intravenous Continuous Excell Seltzer, MD       Current Outpatient Prescriptions on File Prior to Encounter  Medication Sig Dispense Refill  . albuterol (PROVENTIL HFA;VENTOLIN HFA) 108 (90 Base) MCG/ACT inhaler Inhale 2 puffs into the lungs every 6 (six) hours as needed for wheezing or shortness of breath. For shortness of breath 54 g 0  . cyanocobalamin (,VITAMIN B-12,) 1000 MCG/ML injection Inject 1,000 mcg into the muscle every 30 (thirty) days.    . diclofenac sodium (VOLTAREN) 1 % GEL Apply 2 g topically 3 (three) times daily as needed (for knee pain). May use up to an additional 3 times daily as needed for pain    . EPINEPHrine (EPIPEN 2-PAK) 0.3 mg/0.3 mL IJ SOAJ injection Inject 0.3 mg  into the muscle once as needed (for severe allergic reaction).    . famotidine (PEPCID) 40 MG tablet Take 40 mg by mouth 2 (two) times daily as needed (for heartburn/indigestion.).     Marland Kitchen fluticasone (FLONASE) 50 MCG/ACT nasal spray Place 1 spray into both nostrils daily as needed for allergies or rhinitis. Reported on 04/01/2016    . ondansetron (ZOFRAN-ODT) 8 MG disintegrating tablet Take 1 tablet (8 mg total) by mouth every 8 (eight) hours as needed for nausea. (Patient taking differently: Take 8 mg by mouth 2 (two) times daily as needed for nausea. ) 20 tablet 3  . pantoprazole (PROTONIX) 40 MG tablet Take 40 mg by mouth 2 (two) times daily.     . promethazine (PHENERGAN) 25 MG tablet Take 1 tablet (25 mg total) by mouth every 6 (six) hours as needed for nausea or vomiting. 30 tablet 1  . sucralfate (CARAFATE) 1 g tablet Take 1 tablet (1 g total) by mouth 4 (four) times daily. 30 tablet 0  . thiamine (VITAMIN B-1) 100 MG tablet Take 100 mg by mouth daily.       A comprehensive review of systems was negative.  There were no vitals taken for this visit. General appearance: alert, cooperative and appears stated age Head: Normocephalic, without obvious abnormality, atraumatic Neck: supple, symmetrical, trachea midline Lungs: normal effort Heart: regular rate and rhythm Abdomen: soft, non-tender Extremities: Homans sign is negative, no sign of DVT Skin: Skin color, texture, turgor normal. No rashes or lesions Neurologic: Grossly normal   Lab Results  Component Value Date   WBC 5.8 05/19/2017   HGB 10.4 (L) 05/19/2017   HCT 34.5 05/19/2017   MCV 83 05/19/2017   PLT 342 05/19/2017   Lab Results  Component Value Date   PREGTESTUR NEGATIVE 01/31/2017   PREGSERUM NEGATIVE 12/10/2016  Assessment/Plan Principal Problem:   Excessive or frequent menstruation  For TVH with bilateral salpingectomy Risks include but are not limited to bleeding, infection, injury to surrounding  structures, including bowel, bladder and ureters, blood clots, and death.  Likelihood of success is high.    [name] 05/25/2017, 12:10 PM

## 2017-05-26 ENCOUNTER — Ambulatory Visit (HOSPITAL_COMMUNITY)
Admission: RE | Admit: 2017-05-26 | Discharge: 2017-05-26 | Disposition: A | Discharge status: Home / Self Care | Payer: 59 | Source: Ambulatory Visit | Attending: Family Medicine | Admitting: Family Medicine

## 2017-05-26 DIAGNOSIS — Z4800 Encounter for change or removal of nonsurgical wound dressing: Secondary | ICD-10-CM | POA: Diagnosis not present

## 2017-05-26 DIAGNOSIS — E860 Dehydration: Secondary | ICD-10-CM

## 2017-05-26 MED ORDER — SODIUM CHLORIDE 0.9% FLUSH
10.0000 mL | INTRAVENOUS | Status: AC | PRN
  Administered 2017-05-26: 10 mL

## 2017-05-26 MED ORDER — HEPARIN SOD (PORK) LOCK FLUSH 100 UNIT/ML IV SOLN
250.0000 [IU] | INTRAVENOUS | Status: AC | PRN
  Administered 2017-05-26: 250 [IU]
  Filled 2017-05-26: qty 5

## 2017-05-26 MED ORDER — ONDANSETRON 4 MG PO TBDP
4.0000 mg | ORAL_TABLET | ORAL | Status: DC | PRN
  Administered 2017-05-26: 4 mg via ORAL
  Filled 2017-05-26: qty 1

## 2017-05-26 MED ORDER — THIAMINE HCL 100 MG/ML IJ SOLN
Freq: Once | INTRAVENOUS | Status: AC
  Administered 2017-05-26: 09:00:00 via INTRAVENOUS
  Filled 2017-05-26: qty 1000

## 2017-05-26 NOTE — Progress Notes (Addendum)
Patient c/o pain at PICC line site. RN noticed a hard bump next to bio patch. Patient reports she noticed it on last Friday 8/24.Consult placed for IV team to assess patient's PICC line for a potential problem.    Update 1105 IV called back at this time. Per IV team nurse, PICC is fine to use as long as there is blood return. RN informed IV team nurse that patient had concerns about bump located at the top of PICC line site. RN asked that IV team come and assess site and change dressing. Per IV team this RN could could do dressing change and have patient follow up with her MD.

## 2017-05-26 NOTE — Discharge Instructions (Signed)
Pt received 1 liter of a banana bag today. She had her PICC line flushed and dressing changed also.

## 2017-05-26 NOTE — Progress Notes (Signed)
Provider: Zannie Cove  Treatment: sodium chloride 0.9 % 1,000 mL with thiamine 884 mg, folic acid 1 mg, multivitamins adult 10 mL infusion    Patient tolerated procedure well. PICC line dressing changed per protocol. RN noticed that tissue above the PICC site was hard and patient stated it was tender. Per Patient, she noticed it last Friday after dressing was changed in Endoscopy. Discharge instructions given to patient and patient states an understanding. Patient alert, oriented and ambulatory at time of discharge.

## 2017-05-27 NOTE — Patient Instructions (Addendum)
Your procedure is scheduled on:  Tuesday, Sept 11  Enter through the Main Entrance of [facility] at: 1:15 pm  Pick up the phone at the desk and dial [phone].  Call this number if you have problems the morning of surgery: [phone].  Remember: Do NOT eat food after midnight Monday  Do NOT drink clear liquids (including water) after 8:30 am Tuesday, day of surgery  Take these medicines the morning of surgery with a SIP OF WATER:  Linzess, protonix, carafate.  Ok to use flonase  And voltaren gel if needed.  Bring Albuterol inhaler with you on day of surgery.  Do Not smoke on the day of surgery.  Stop herbal medications and supplements at this time.  Do NOT wear jewelry (body piercing), metal hair clips/bobby pins, make-up, or nail polish. Do NOT wear lotions, powders, or perfumes.  You may wear deoderant. Do NOT shave for 48 hours prior to surgery. Do NOT bring valuables to the hospital. Bridgework may not be worn into surgery.  Leave suitcase in car.  After surgery it may be brought to your room.  For patients admitted to the hospital, checkout time is 11:00 AM the day of discharge. Have a responsible adult drive you home and stay with you for 24 hours after your procedure.  Home with Randol Kern cell [phone].

## 2017-05-28 ENCOUNTER — Encounter (HOSPITAL_COMMUNITY): Payer: Self-pay | Admitting: *Deleted

## 2017-05-28 ENCOUNTER — Encounter (HOSPITAL_COMMUNITY)
Admission: RE | Admit: 2017-05-28 | Discharge: 2017-05-28 | Disposition: A | Discharge status: Home / Self Care | Payer: 59 | Source: Ambulatory Visit | Attending: Family Medicine | Admitting: Family Medicine

## 2017-05-28 DIAGNOSIS — Z01818 Encounter for other preprocedural examination: Secondary | ICD-10-CM | POA: Insufficient documentation

## 2017-05-28 HISTORY — DX: Personal history of other diseases of the digestive system: Z87.19

## 2017-05-28 HISTORY — DX: Personal history of peptic ulcer disease: Z87.11

## 2017-05-28 LAB — CBC WITH DIFFERENTIAL/PLATELET
BASOS ABS: 0 10*3/uL (ref 0.0–0.1)
BASOS PCT: 1 %
Eosinophils Absolute: 0.3 10*3/uL (ref 0.0–0.7)
Eosinophils Relative: 5 %
HEMATOCRIT: 29.6 % — AB (ref 36.0–46.0)
HEMOGLOBIN: 9.6 g/dL — AB (ref 12.0–15.0)
Lymphocytes Relative: 41 %
Lymphs Abs: 2.7 10*3/uL (ref 0.7–4.0)
MCH: 26.3 pg (ref 26.0–34.0)
MCHC: 32.4 g/dL (ref 30.0–36.0)
MCV: 81.1 fL (ref 78.0–100.0)
MONOS PCT: 2 %
Monocytes Absolute: 0.2 10*3/uL (ref 0.1–1.0)
NEUTROS ABS: 3.3 10*3/uL (ref 1.7–7.7)
NEUTROS PCT: 51 %
Platelets: 315 10*3/uL (ref 150–400)
RBC: 3.65 MIL/uL — ABNORMAL LOW (ref 3.87–5.11)
RDW: 17.5 % — ABNORMAL HIGH (ref 11.5–15.5)
WBC: 6.6 10*3/uL (ref 4.0–10.5)

## 2017-05-28 LAB — TYPE AND SCREEN
ABO/RH(D): O POS
Antibody Screen: NEGATIVE

## 2017-05-28 LAB — COMPREHENSIVE METABOLIC PANEL
ALBUMIN: 3.8 g/dL (ref 3.5–5.0)
ALK PHOS: 49 U/L (ref 38–126)
ALT: 9 U/L — AB (ref 14–54)
AST: 18 U/L (ref 15–41)
Anion gap: 8 (ref 5–15)
BUN: 10 mg/dL (ref 6–20)
CALCIUM: 9.2 mg/dL (ref 8.9–10.3)
CO2: 21 mmol/L — ABNORMAL LOW (ref 22–32)
CREATININE: 0.75 mg/dL (ref 0.44–1.00)
Chloride: 108 mmol/L (ref 101–111)
GFR calc Af Amer: >60 mL/min (ref >60)
GLUCOSE: 80 mg/dL (ref 65–99)
Potassium: 4.4 mmol/L (ref 3.5–5.1)
Sodium: 137 mmol/L (ref 135–145)
TOTAL PROTEIN: 7.5 g/dL (ref 6.5–8.1)
Total Bilirubin: 0.4 mg/dL (ref 0.3–1.2)

## 2017-05-28 LAB — ABO/RH: ABO/RH(D): O POS

## 2017-05-28 NOTE — Pre-Procedure Instructions (Signed)
Patient has double lumen PICC line.  Called IV Team [phone], spoke to [place] to schedule RN on IV Team to be here at 1:30pm on DOS to access PICC line.    SDS BB History Log given to lab for patient's previous history of blood transfusion in 03/2016 at [facility].

## 2017-06-02 ENCOUNTER — Telehealth: Payer: Self-pay | Admitting: Skilled Nursing Facility1

## 2017-06-02 ENCOUNTER — Ambulatory Visit (HOSPITAL_COMMUNITY)
Admission: RE | Admit: 2017-06-02 | Discharge: 2017-06-02 | Disposition: A | Discharge status: Home / Self Care | Payer: 59 | Source: Ambulatory Visit | Attending: Family Medicine | Admitting: Family Medicine

## 2017-06-02 ENCOUNTER — Encounter: Payer: 59 | Attending: Family Medicine | Admitting: Skilled Nursing Facility1

## 2017-06-02 ENCOUNTER — Encounter: Payer: Self-pay | Admitting: Skilled Nursing Facility1

## 2017-06-02 DIAGNOSIS — Z9884 Bariatric surgery status: Secondary | ICD-10-CM | POA: Insufficient documentation

## 2017-06-02 DIAGNOSIS — Z6830 Body mass index (BMI) 30.0-30.9, adult: Secondary | ICD-10-CM | POA: Diagnosis not present

## 2017-06-02 DIAGNOSIS — E669 Obesity, unspecified: Secondary | ICD-10-CM | POA: Insufficient documentation

## 2017-06-02 DIAGNOSIS — Z713 Dietary counseling and surveillance: Secondary | ICD-10-CM | POA: Insufficient documentation

## 2017-06-02 DIAGNOSIS — E860 Dehydration: Secondary | ICD-10-CM

## 2017-06-02 DIAGNOSIS — Z4800 Encounter for change or removal of nonsurgical wound dressing: Secondary | ICD-10-CM | POA: Diagnosis present

## 2017-06-02 MED ORDER — SODIUM CHLORIDE 0.9% FLUSH
10.0000 mL | INTRAVENOUS | Status: AC | PRN
  Administered 2017-06-02: 10 mL

## 2017-06-02 MED ORDER — ONDANSETRON 4 MG PO TBDP
4.0000 mg | ORAL_TABLET | ORAL | Status: DC | PRN
  Administered 2017-06-02: 4 mg via ORAL
  Filled 2017-06-02: qty 1

## 2017-06-02 MED ORDER — M.V.I. ADULT IV INJ
INJECTION | Freq: Once | INTRAVENOUS | Status: AC
  Administered 2017-06-02: 11:00:00 via INTRAVENOUS
  Filled 2017-06-02: qty 1000

## 2017-06-02 MED ORDER — HEPARIN SOD (PORK) LOCK FLUSH 100 UNIT/ML IV SOLN
250.0000 [IU] | INTRAVENOUS | Status: AC | PRN
  Administered 2017-06-02: 250 [IU]
  Filled 2017-06-02: qty 5

## 2017-06-02 NOTE — Telephone Encounter (Signed)
Opened in error

## 2017-06-02 NOTE — Telephone Encounter (Signed)
Made in error

## 2017-06-02 NOTE — Progress Notes (Signed)
  Post Op appt: Pt states she Does not like to leave the house due to self image/depression. Pt states she goes to the rehydration clinic once a week; Arives with a PICC line in her right arm been doing this for 5 weeks. Pt states she goes for an endoscopy tomorrow. Pt states she is able to have ice now. Pt states she is getting a hysterectomy in mid September. Pt states she sees Dr. Excell Seltzer every month. Pt states she still cannot get any food down. Pt states smelling food causes her to throw up. Pt is now having episodes of hearing loss. Pt is currently taking care of her mother after her surgery.   Pt arrives stating her moth was watering all day yesterday and starting at 4am this morning has been vomiting slim: thick saliva. Pt states she goes to the hydration clinic today. Pt is very tired and walking slowly. Pt states her heart rate has been in the 100's. Pt states she does not have the results of her endoscopy yet. Pt states her lower back is having severe pain. Pt states she has not been able to get anything down including fluids since her last visit. Pt states she is tired of trying to eat and drink because she just throws it up. Pt states she gets very out of breath easily. Pt states the linzess helps with cramping. Pt states she is still having episodes of hearing loss and feeling like she is under water. Pt states her knees having given out a couple times resulting in 2 falls.    Nutritional Diagnosis:  Inadequate fluid intake As related to little to no po intake and continuous emesis.  As evidenced by PICC line placement.    02/17/2017 03/08/2017 06/02/2017   BMI (kg/m^2) 28.1 27.4 27.3   Fat Mass (lbs) 71.8 64 62.6   Fat Free Mass (lbs) 105.2 111 106.8   Total Body Water (lbs) 75 79 76   Surgery date: 12/14/2016 Surgery type: RYGB Start weight at [facility]: 230 Weight today: 169.4  TANITA  BODY COMP RESULTS  12/29/2016 05/19/2017   BMI (kg/m^2) 31.1 26.7   Fat Mass (lbs) 86.4 64   Fat Free Mass (lbs) 109.4 104.2   Total Body Water (lbs) 78.8 74    24-hr recall: for the past 2 weeks going on 3 weeks B (AM): vitamins, protonix, pepcid hour later carafate with sips of water Snk (AM): nausea medicine with sips of water L (PM):  Snk (PM):  D (PM):  Snk (PM):   Fluid intake: Gatorade, limeade/blueberry aid (insuffiencient)  Estimated total protein intake: insufficient   Medications: See List Supplementation: none due to vomiting  Recent physical activity:  ADL's  Goals: -Try sugar free pudding -Try unsweetened soy milk -Try Chike protein powder -Try the celebrate or bariatric advantage capsule : open it and dump it into something or try swallowing it -Try those capsules you put your own vitamins in with a  Syringe

## 2017-06-02 NOTE — Progress Notes (Signed)
Diagnosis: Dehydration   Ordering Provider: Excell Seltzer, MD   Procedure: IV infusion of NS with thiamine, folic acid, and multivitamin additives; PICC line dressing change per protocol    Pt tolerated infusion well. She was alert, oriented and ambulatory at discharge. D/C instructions given with verbal understanding.

## 2017-06-02 NOTE — Discharge Instructions (Signed)
Patient received sodium chloride 0.9 % 1,000 mL with thiamine 001 mg, folic acid 1 mg, multivitamins adult 10 mL infusion on today 9/5. Patients dressing also changed.

## 2017-06-07 ENCOUNTER — Encounter (HOSPITAL_COMMUNITY): Payer: 59

## 2017-06-08 ENCOUNTER — Encounter (HOSPITAL_COMMUNITY)
Admission: RE | Disposition: A | Discharge status: Home / Self Care | Payer: Self-pay | Source: Ambulatory Visit | Attending: Family Medicine

## 2017-06-08 ENCOUNTER — Encounter (HOSPITAL_COMMUNITY): Payer: Self-pay | Admitting: Emergency Medicine

## 2017-06-08 ENCOUNTER — Ambulatory Visit (HOSPITAL_COMMUNITY)
Admission: RE | Admit: 2017-06-08 | Discharge: 2017-06-10 | Disposition: A | Discharge status: Home / Self Care | Payer: 59 | Source: Ambulatory Visit | Attending: Family Medicine | Admitting: Family Medicine

## 2017-06-08 ENCOUNTER — Ambulatory Visit (HOSPITAL_COMMUNITY): Payer: 59 | Admitting: Anesthesiology

## 2017-06-08 DIAGNOSIS — N72 Inflammatory disease of cervix uteri: Secondary | ICD-10-CM | POA: Diagnosis not present

## 2017-06-08 DIAGNOSIS — N9089 Other specified noninflammatory disorders of vulva and perineum: Secondary | ICD-10-CM | POA: Diagnosis not present

## 2017-06-08 DIAGNOSIS — Z79899 Other long term (current) drug therapy: Secondary | ICD-10-CM | POA: Insufficient documentation

## 2017-06-08 DIAGNOSIS — Z87891 Personal history of nicotine dependence: Secondary | ICD-10-CM | POA: Insufficient documentation

## 2017-06-08 DIAGNOSIS — L918 Other hypertrophic disorders of the skin: Secondary | ICD-10-CM | POA: Insufficient documentation

## 2017-06-08 DIAGNOSIS — D259 Leiomyoma of uterus, unspecified: Secondary | ICD-10-CM | POA: Insufficient documentation

## 2017-06-08 DIAGNOSIS — K219 Gastro-esophageal reflux disease without esophagitis: Secondary | ICD-10-CM | POA: Diagnosis not present

## 2017-06-08 DIAGNOSIS — Z9884 Bariatric surgery status: Secondary | ICD-10-CM | POA: Diagnosis not present

## 2017-06-08 DIAGNOSIS — N920 Excessive and frequent menstruation with regular cycle: Secondary | ICD-10-CM | POA: Diagnosis present

## 2017-06-08 DIAGNOSIS — K648 Other hemorrhoids: Secondary | ICD-10-CM | POA: Diagnosis not present

## 2017-06-08 DIAGNOSIS — J45909 Unspecified asthma, uncomplicated: Secondary | ICD-10-CM | POA: Insufficient documentation

## 2017-06-08 DIAGNOSIS — K644 Residual hemorrhoidal skin tags: Secondary | ICD-10-CM | POA: Diagnosis not present

## 2017-06-08 DIAGNOSIS — Z90710 Acquired absence of both cervix and uterus: Secondary | ICD-10-CM | POA: Diagnosis present

## 2017-06-08 HISTORY — PX: VAGINAL HYSTERECTOMY: SHX2639

## 2017-06-08 HISTORY — PX: EXCISION OF SKIN TAG: SHX6270

## 2017-06-08 LAB — PREGNANCY, URINE: Preg Test, Ur: NEGATIVE

## 2017-06-08 SURGERY — HYSTERECTOMY, VAGINAL
Anesthesia: General | Site: Vulva | Laterality: Bilateral

## 2017-06-08 MED ORDER — PROPOFOL 10 MG/ML IV BOLUS
INTRAVENOUS | Status: DC | PRN
  Administered 2017-06-08: 150 mg via INTRAVENOUS

## 2017-06-08 MED ORDER — SODIUM CHLORIDE 0.9% FLUSH: 9.0000 mL | INTRAVENOUS | Status: DC | PRN

## 2017-06-08 MED ORDER — PROMETHAZINE HCL 25 MG PO TABS
25.0000 mg | ORAL_TABLET | Freq: Four times a day (QID) | ORAL | Status: DC | PRN
  Administered 2017-06-08 – 2017-06-10 (×2): 25 mg via ORAL
  Filled 2017-06-08 (×2): qty 1

## 2017-06-08 MED ORDER — SUGAMMADEX SODIUM 200 MG/2ML IV SOLN
INTRAVENOUS | Status: DC | PRN
  Administered 2017-06-08: 153.6 mg via INTRAVENOUS

## 2017-06-08 MED ORDER — DIPHENHYDRAMINE HCL 50 MG/ML IJ SOLN: 12.5000 mg | Freq: Four times a day (QID) | INTRAMUSCULAR | Status: DC | PRN

## 2017-06-08 MED ORDER — LACTATED RINGERS IV SOLN
INTRAVENOUS | Status: DC | PRN
  Administered 2017-06-08: 15:00:00 via INTRAVENOUS

## 2017-06-08 MED ORDER — FENTANYL CITRATE (PF) 100 MCG/2ML IJ SOLN
INTRAMUSCULAR | Status: DC | PRN
  Administered 2017-06-08: 50 ug via INTRAVENOUS
  Administered 2017-06-08: 100 ug via INTRAVENOUS

## 2017-06-08 MED ORDER — ESTRADIOL 0.1 MG/GM VA CREA
TOPICAL_CREAM | VAGINAL | Status: AC
  Filled 2017-06-08: qty 42.5

## 2017-06-08 MED ORDER — KETOROLAC TROMETHAMINE 30 MG/ML IJ SOLN
30.0000 mg | Freq: Once | INTRAMUSCULAR | Status: AC
  Administered 2017-06-08: 30 mg via INTRAVENOUS

## 2017-06-08 MED ORDER — DEXAMETHASONE SODIUM PHOSPHATE 4 MG/ML IJ SOLN
INTRAMUSCULAR | Status: AC
  Filled 2017-06-08: qty 1

## 2017-06-08 MED ORDER — NALOXONE HCL 0.4 MG/ML IJ SOLN
0.4000 mg | INTRAMUSCULAR | Status: DC | PRN
Start: 2017-06-08 — End: 2017-06-09

## 2017-06-08 MED ORDER — ONDANSETRON 8 MG PO TBDP
8.0000 mg | ORAL_TABLET | Freq: Two times a day (BID) | ORAL | Status: DC | PRN
  Filled 2017-06-08: qty 1

## 2017-06-08 MED ORDER — PANTOPRAZOLE SODIUM 40 MG PO TBEC
40.0000 mg | DELAYED_RELEASE_TABLET | Freq: Two times a day (BID) | ORAL | Status: DC
  Administered 2017-06-08 – 2017-06-10 (×4): 40 mg via ORAL
  Filled 2017-06-08 (×3): qty 1

## 2017-06-08 MED ORDER — LACTATED RINGERS IV SOLN: INTRAVENOUS | Status: DC

## 2017-06-08 MED ORDER — ESTRADIOL 0.1 MG/GM VA CREA
TOPICAL_CREAM | VAGINAL | Status: DC | PRN
Start: 2017-06-08 — End: 2017-06-08
  Administered 2017-06-08: 1 via VAGINAL

## 2017-06-08 MED ORDER — LIDOCAINE HCL (CARDIAC) 20 MG/ML IV SOLN
INTRAVENOUS | Status: AC
  Filled 2017-06-08: qty 5

## 2017-06-08 MED ORDER — SODIUM CHLORIDE 0.9% FLUSH
10.0000 mL | Freq: Two times a day (BID) | INTRAVENOUS | Status: DC
  Administered 2017-06-08: 10 mL

## 2017-06-08 MED ORDER — DEXAMETHASONE SODIUM PHOSPHATE 10 MG/ML IJ SOLN
INTRAMUSCULAR | Status: DC | PRN
  Administered 2017-06-08: 4 mg via INTRAVENOUS

## 2017-06-08 MED ORDER — FAMOTIDINE 20 MG PO TABS: 40.0000 mg | ORAL_TABLET | Freq: Two times a day (BID) | ORAL | Status: DC | PRN

## 2017-06-08 MED ORDER — LIDOCAINE-EPINEPHRINE 1 %-1:100000 IJ SOLN
INTRAMUSCULAR | Status: AC
  Filled 2017-06-08: qty 1

## 2017-06-08 MED ORDER — ROCURONIUM BROMIDE 100 MG/10ML IV SOLN
INTRAVENOUS | Status: AC
  Filled 2017-06-08: qty 1

## 2017-06-08 MED ORDER — PROMETHAZINE HCL 25 MG/ML IJ SOLN: 6.2500 mg | INTRAMUSCULAR | Status: DC | PRN

## 2017-06-08 MED ORDER — PROPOFOL 10 MG/ML IV BOLUS
INTRAVENOUS | Status: AC
  Filled 2017-06-08: qty 20

## 2017-06-08 MED ORDER — LACTATED RINGERS IV SOLN
INTRAVENOUS | Status: DC
  Administered 2017-06-09: 02:00:00 via INTRAVENOUS

## 2017-06-08 MED ORDER — SCOPOLAMINE 1 MG/3DAYS TD PT72
1.0000 | MEDICATED_PATCH | Freq: Once | TRANSDERMAL | Status: DC
  Administered 2017-06-08: 1.5 mg via TRANSDERMAL

## 2017-06-08 MED ORDER — FENTANYL CITRATE (PF) 100 MCG/2ML IJ SOLN
25.0000 ug | INTRAMUSCULAR | Status: DC | PRN
  Administered 2017-06-08 (×2): 50 ug via INTRAVENOUS

## 2017-06-08 MED ORDER — LIDOCAINE HCL (CARDIAC) 20 MG/ML IV SOLN
INTRAVENOUS | Status: DC | PRN
  Administered 2017-06-08: 30 mg via INTRAVENOUS

## 2017-06-08 MED ORDER — LACTATED RINGERS IV SOLN
INTRAVENOUS | Status: DC
  Administered 2017-06-08: 17:00:00 via INTRAVENOUS

## 2017-06-08 MED ORDER — SCOPOLAMINE 1 MG/3DAYS TD PT72
MEDICATED_PATCH | TRANSDERMAL | Status: AC
  Administered 2017-06-08: 1.5 mg via TRANSDERMAL
  Filled 2017-06-08: qty 1

## 2017-06-08 MED ORDER — HYDROMORPHONE 1 MG/ML IV SOLN
INTRAVENOUS | Status: DC
  Administered 2017-06-08: 20:00:00 via INTRAVENOUS
  Administered 2017-06-09: 8 mg via INTRAVENOUS
  Administered 2017-06-09: 1.6 mg via INTRAVENOUS
  Filled 2017-06-08: qty 25

## 2017-06-08 MED ORDER — CEFAZOLIN SODIUM-DEXTROSE 2-4 GM/100ML-% IV SOLN
2.0000 g | INTRAVENOUS | Status: AC
  Administered 2017-06-08: 2 g via INTRAVENOUS

## 2017-06-08 MED ORDER — FENTANYL CITRATE (PF) 250 MCG/5ML IJ SOLN
INTRAMUSCULAR | Status: AC
  Filled 2017-06-08: qty 5

## 2017-06-08 MED ORDER — SUCRALFATE 1 G PO TABS
1.0000 g | ORAL_TABLET | Freq: Four times a day (QID) | ORAL | Status: DC
  Administered 2017-06-08 – 2017-06-10 (×6): 1 g via ORAL
  Filled 2017-06-08 (×11): qty 1

## 2017-06-08 MED ORDER — SODIUM CHLORIDE 0.9% FLUSH: 10.0000 mL | INTRAVENOUS | Status: DC | PRN

## 2017-06-08 MED ORDER — ROCURONIUM BROMIDE 100 MG/10ML IV SOLN
INTRAVENOUS | Status: DC | PRN
  Administered 2017-06-08: 30 mg via INTRAVENOUS

## 2017-06-08 MED ORDER — MENTHOL 3 MG MT LOZG: 1.0000 | LOZENGE | OROMUCOSAL | Status: DC | PRN

## 2017-06-08 MED ORDER — LIDOCAINE-EPINEPHRINE 1 %-1:100000 IJ SOLN
INTRAMUSCULAR | Status: DC | PRN
  Administered 2017-06-08: 20 mL

## 2017-06-08 MED ORDER — MIDAZOLAM HCL 2 MG/2ML IJ SOLN
INTRAMUSCULAR | Status: DC | PRN
  Administered 2017-06-08 (×2): 1 mg via INTRAVENOUS

## 2017-06-08 MED ORDER — SUGAMMADEX SODIUM 200 MG/2ML IV SOLN
INTRAVENOUS | Status: AC
  Filled 2017-06-08: qty 2

## 2017-06-08 MED ORDER — KETOROLAC TROMETHAMINE 30 MG/ML IJ SOLN
INTRAMUSCULAR | Status: AC
  Filled 2017-06-08: qty 1

## 2017-06-08 MED ORDER — MIDAZOLAM HCL 2 MG/2ML IJ SOLN
INTRAMUSCULAR | Status: AC
  Filled 2017-06-08: qty 2

## 2017-06-08 MED ORDER — SODIUM CHLORIDE 0.9% FLUSH: 10.0000 mL | Freq: Two times a day (BID) | INTRAVENOUS | Status: DC

## 2017-06-08 MED ORDER — ONDANSETRON HCL 4 MG/2ML IJ SOLN: 4.0000 mg | Freq: Four times a day (QID) | INTRAMUSCULAR | Status: DC | PRN

## 2017-06-08 MED ORDER — ALBUTEROL SULFATE (2.5 MG/3ML) 0.083% IN NEBU: 3.0000 mL | INHALATION_SOLUTION | Freq: Four times a day (QID) | RESPIRATORY_TRACT | Status: DC | PRN

## 2017-06-08 MED ORDER — FENTANYL CITRATE (PF) 100 MCG/2ML IJ SOLN
INTRAMUSCULAR | Status: AC
  Filled 2017-06-08: qty 2

## 2017-06-08 MED ORDER — CEFAZOLIN SODIUM-DEXTROSE 2-4 GM/100ML-% IV SOLN
INTRAVENOUS | Status: AC
  Filled 2017-06-08: qty 100

## 2017-06-08 MED ORDER — VITAMIN B-1 100 MG PO TABS
100.0000 mg | ORAL_TABLET | Freq: Every day | ORAL | Status: DC
  Administered 2017-06-09 – 2017-06-10 (×2): 100 mg via ORAL
  Filled 2017-06-08 (×3): qty 1

## 2017-06-08 MED ORDER — LINACLOTIDE 290 MCG PO CAPS
290.0000 ug | ORAL_CAPSULE | Freq: Every day | ORAL | Status: DC
  Administered 2017-06-09 – 2017-06-10 (×2): 290 ug via ORAL
  Filled 2017-06-08 (×4): qty 1

## 2017-06-08 MED ORDER — HYDROMORPHONE HCL 2 MG PO TABS
2.0000 mg | ORAL_TABLET | ORAL | Status: DC | PRN
  Administered 2017-06-09 – 2017-06-10 (×7): 2 mg via ORAL
  Filled 2017-06-08 (×7): qty 1

## 2017-06-08 MED ORDER — DIPHENHYDRAMINE HCL 12.5 MG/5ML PO ELIX: 12.5000 mg | ORAL_SOLUTION | Freq: Four times a day (QID) | ORAL | Status: DC | PRN

## 2017-06-08 MED ORDER — ADULT MULTIVITAMIN W/MINERALS CH
1.0000 | ORAL_TABLET | Freq: Every day | ORAL | Status: DC
  Administered 2017-06-09 – 2017-06-10 (×2): 1 via ORAL
  Filled 2017-06-08 (×3): qty 1

## 2017-06-08 MED ORDER — ONDANSETRON HCL 4 MG/2ML IJ SOLN
INTRAMUSCULAR | Status: AC
  Filled 2017-06-08: qty 2

## 2017-06-08 MED ORDER — ONDANSETRON HCL 4 MG/2ML IJ SOLN
INTRAMUSCULAR | Status: DC | PRN
Start: 2017-06-08 — End: 2017-06-08
  Administered 2017-06-08: 4 mg via INTRAVENOUS

## 2017-06-08 SURGICAL SUPPLY — 27 items
CANISTER SUCT 3000ML PPV (MISCELLANEOUS) ×3 IMPLANT
CLOTH BEACON ORANGE TIMEOUT ST (SAFETY) ×3 IMPLANT
CONT PATH 16OZ SNAP LID 3702 (MISCELLANEOUS) ×2 IMPLANT
DECANTER SPIKE VIAL GLASS SM (MISCELLANEOUS) ×1 IMPLANT
GAUZE PACKING 2X5 YD STRL (GAUZE/BANDAGES/DRESSINGS) ×2 IMPLANT
GLOVE BIOGEL PI IND STRL 6.5 (GLOVE) ×2 IMPLANT
GLOVE BIOGEL PI IND STRL 7.0 (GLOVE) ×4 IMPLANT
GLOVE BIOGEL PI INDICATOR 6.5 (GLOVE) ×1
GLOVE BIOGEL PI INDICATOR 7.0 (GLOVE) ×2
GLOVE ECLIPSE 7.0 STRL STRAW (GLOVE) ×6 IMPLANT
GOWN STRL REUS W/TWL LRG LVL3 (GOWN DISPOSABLE) ×15 IMPLANT
NDL SPNL 18GX3.5 QUINCKE PK (NEEDLE) ×2 IMPLANT
NEEDLE HYPO 22GX1.5 SAFETY (NEEDLE) IMPLANT
NEEDLE SPNL 18GX3.5 QUINCKE PK (NEEDLE) ×3 IMPLANT
NS IRRIG 1000ML POUR BTL (IV SOLUTION) ×3 IMPLANT
PACK TRENDGUARD 600 HYBRD PROC (MISCELLANEOUS) IMPLANT
PACK VAGINAL WOMENS (CUSTOM PROCEDURE TRAY) ×3 IMPLANT
PAD OB MATERNITY 4.3X12.25 (PERSONAL CARE ITEMS) ×3 IMPLANT
SUT VIC AB 0 CT1 18XCR BRD8 (SUTURE) ×6 IMPLANT
SUT VIC AB 0 CT1 27 (SUTURE) ×6
SUT VIC AB 0 CT1 27XBRD ANBCTR (SUTURE) ×4 IMPLANT
SUT VIC AB 0 CT1 8-18 (SUTURE) ×9
SUT VICRYL 0 TIES 12 18 (SUTURE) ×3 IMPLANT
SYR 20CC LL (SYRINGE) ×3 IMPLANT
TOWEL OR 17X24 6PK STRL BLUE (TOWEL DISPOSABLE) ×6 IMPLANT
TRAY FOLEY CATH SILVER 14FR (SET/KITS/TRAYS/PACK) ×3 IMPLANT
TRENDGUARD 600 HYBRID PROC PK (MISCELLANEOUS)

## 2017-06-08 NOTE — Anesthesia Preprocedure Evaluation (Addendum)
Anesthesia Evaluation  Patient identified by MRN, date of birth, ID band Patient awake    Reviewed: Allergy & Precautions, NPO status , Patient's Chart, lab work & pertinent test results  History of Anesthesia Complications (+) PONV  Airway Mallampati: II  TM Distance: >3 FB Neck ROM: Full    Dental  (+) Dental Advisory Given, Partial Lower, Poor Dentition, Missing   Pulmonary asthma , former smoker,    breath sounds clear to auscultation       Cardiovascular  Rhythm:Regular Rate:Normal     Neuro/Psych  Headaches, PSYCHIATRIC DISORDERS Anxiety Depression Vertigo - not on meds    GI/Hepatic Neg liver ROS, hiatal hernia, PUD, GERD  ,UC   Endo/Other  negative endocrine ROS  Renal/GU negative Renal ROS     Musculoskeletal  (+) Arthritis , Osteoarthritis,    Abdominal   Peds  Hematology  (+) anemia ,   Anesthesia Other Findings   Reproductive/Obstetrics                                                             Anesthesia Evaluation  Patient identified by MRN, date of birth, ID band Patient awake    Reviewed: Allergy & Precautions, NPO status , Patient's Chart, lab work & pertinent test results  History of Anesthesia Complications (+) PONV  Airway Mallampati: II  TM Distance: >3 FB Neck ROM: Full    Dental no notable dental hx.    Pulmonary asthma , former smoker,    Pulmonary exam normal breath sounds clear to auscultation       Cardiovascular negative cardio ROS Normal cardiovascular exam Rhythm:Regular Rate:Normal     Neuro/Psych negative neurological ROS  negative psych ROS   GI/Hepatic Neg liver ROS, hiatal hernia, PUD, GERD  ,UC   Endo/Other  negative endocrine ROS  Renal/GU negative Renal ROS  negative genitourinary   Musculoskeletal negative musculoskeletal ROS (+)   Abdominal   Peds negative pediatric ROS (+)  Hematology negative  hematology ROS (+)   Anesthesia Other Findings   Reproductive/Obstetrics negative OB ROS                             Anesthesia Physical Anesthesia Plan  ASA: II  Anesthesia Plan: MAC   Post-op Pain Management:    Induction:   PONV Risk Score and Plan: 3 and Ondansetron and Propofol  Airway Management Planned:   Additional Equipment:   Intra-op Plan:   Post-operative Plan:   Informed Consent: I have reviewed the patients History and Physical, chart, labs and discussed the procedure including the risks, benefits and alternatives for the proposed anesthesia with the patient or authorized representative who has indicated his/her understanding and acceptance.   Dental advisory given  Plan Discussed with:   Anesthesia Plan Comments:         Anesthesia Quick Evaluation  Anesthesia Physical  Anesthesia Plan  ASA: III  Anesthesia Plan: General   Post-op Pain Management:    Induction:   PONV Risk Score and Plan: 4 or greater and Ondansetron, Dexamethasone, Midazolam, Scopolamine patch - Pre-op, Propofol infusion and Treatment may vary due to age or medical condition  Airway Management Planned: Oral ETT  Additional Equipment: None  Intra-op Plan:   Post-operative Plan:  Extubation in OR  Informed Consent: I have reviewed the patients History and Physical, chart, labs and discussed the procedure including the risks, benefits and alternatives for the proposed anesthesia with the patient or authorized representative who has indicated his/her understanding and acceptance.   Dental advisory given  Plan Discussed with: CRNA  Anesthesia Plan Comments:         Anesthesia Quick Evaluation

## 2017-06-08 NOTE — Transfer of Care (Signed)
Immediate Anesthesia Transfer of Care Note  Patient: [name]  Procedure(s) Performed: Procedure(s): HYSTERECTOMY VAGINAL W/ BILATERAL SALPINGECTOMY (Bilateral)  Patient Location: PACU  Anesthesia Type:General  Level of Consciousness: awake, alert  and oriented  Airway & Oxygen Therapy: Patient Spontanous Breathing and Patient connected to nasal cannula oxygen  Post-op Assessment: Report given to RN and Post -op Vital signs reviewed and stable  Post vital signs: Reviewed and stable  Last Vitals:  Vitals:   06/08/17 1313  BP: 121/81  Pulse: 69  Resp: 16  Temp: 36.8 C  SpO2: 100%    Last Pain:  Vitals:   06/08/17 1313  TempSrc: Oral  PainSc: 3       Patients Stated Pain Goal: 4 (94/07/68 0881)  Complications: No apparent anesthesia complications

## 2017-06-08 NOTE — Anesthesia Procedure Notes (Signed)
Procedure Name: Intubation Date/Time: 06/08/2017 2:50 PM Performed by: Tobin Chad Pre-anesthesia Checklist: Patient identified, Emergency Drugs available, Suction available, Timeout performed and Patient being monitored Patient Re-evaluated:Patient Re-evaluated prior to induction Oxygen Delivery Method: Circle system utilized Preoxygenation: Pre-oxygenation with 100% oxygen Induction Type: IV induction Ventilation: Mask ventilation without difficulty Laryngoscope Size: Mac and 3 Grade View: Grade I Tube type: Oral Tube size: 7.0 mm Number of attempts: 1 Airway Equipment and Method: Stylet Placement Confirmation: ETT inserted through vocal cords under direct vision,  positive ETCO2 and breath sounds checked- equal and bilateral Secured at: 21 cm Tube secured with: Tape Dental Injury: Teeth and Oropharynx as per pre-operative assessment

## 2017-06-08 NOTE — Anesthesia Postprocedure Evaluation (Signed)
Anesthesia Post Note  Patient: TENA LINEBAUGH  Procedure(s) Performed: Procedure(s) (LRB): HYSTERECTOMY VAGINAL W/ BILATERAL SALPINGECTOMY (Bilateral) EXCISION OF VULVAR SKIN TAGS X2     Patient location during evaluation: PACU Anesthesia Type: General Level of consciousness: awake and alert Pain management: pain level controlled Vital Signs Assessment: post-procedure vital signs reviewed and stable Respiratory status: spontaneous breathing, nonlabored ventilation, respiratory function stable and patient connected to nasal cannula oxygen Cardiovascular status: blood pressure returned to baseline and stable Postop Assessment: no signs of nausea or vomiting Anesthetic complications: no    Last Vitals:  Vitals:   06/08/17 1630 06/08/17 1645  BP:  117/70  Pulse: 72 64  Resp: 15 12  Temp:    SpO2: 100% 100%    Last Pain:  Vitals:   06/08/17 1645  TempSrc:   PainSc: Asleep   Pain Goal: Patients Stated Pain Goal: 4 (06/08/17 1313)               Audry Pili

## 2017-06-08 NOTE — Op Note (Addendum)
Preoperative diagnosis: menorrhagia  Postoperative diagnosis: Same  Procedure: Transvaginal hysterectomy and bilateral salpingectomy and skin tag removal x 2  Surgeon: Standley Dakins. Kennon Rounds, M.D.  Assistant: Lavonia Drafts, MD  Anesthesia: General ETT  Findings: Mildly enlarged uterus  Estimated blood loss: 150 cc  Specimen: Uterus to pathology  Reason for procedure: Patient had long h/o bleeding and pelvic pain. Previously had BTL and Novasure due to Mirena causing cramping and bleeding. The patient desired definitive treatment. Risks of hysterectomy reviewed.  Risks include but are not limited to bleeding, infection, injury to surrounding structures, including bowel, bladder and ureters, blood clots, and death.  Likelihood of success of surgery is high.  Procedure: Patient was taken to the OR where she was placed in dorsal lithotomy in [place]. She was prepped and draped in the usual sterile fashion. A timeout was performed. The patient received 1 g of Ancef prior to procedure. The patient had SCDs in place. Two small skin tags removed with a knife from either side of the introitus. Hemostasis with electrocautery.  A speculum was placed inside the vagina. The cervix was visualized and grasped with 2 double-tooth tenacula. 20 cc of 1% lidocaine with epinephrine were injected paracervically. A knife was used to make a circumferential incision around the vagina. An opened sponge was used to dissect the vagina off the cervix. The posterior peritoneum was entered sharply with Mayo scissors. The posterior peritoneum was tagged to the vaginal cuff with a single stitch. The anterior peritoneal cavity was entered sharply with careful dissection of the bladder off the underlying cervix. A Heaney clamp was used to clamp first the left uterosacral ligament and cardinal which was then cut and Haney suture ligated with 0 Vicryl stitch, the stitch was held. Similarly the right uterosacral ligament was  clamped cut and suture ligated. Sequential bites up the broad to the uterine arteries were taken until the tubo-ovarian pedicles were encountered. The uterus was then inverted and the left utero-ovarian pedicle grasped with a Heaney clamp. The right utero-ovarian pedicle was similarly grasped with the Heaney clamp. Tube removed and ligated with a free tie followed by suture ligature.  The left tube removed and ligated with a free tie. Inspection of all pedicles revealed adequate hemostasis. There was some bleeding noted at the vaginal cuff on the right side. The vagina was closed with 0 Vicryl suture in a locked running fashion with care taken to incorporate the uterosacral pedicles. Excellent hemostasis was noted at the end of the case. The vaginal cuff was inspected there was minimal bleeding noted.  A Foley catheter is placed inside her bladder. Clear, yellow urine was noted. Vaginal packing with Estrace placed. All instrument needle and lap counts were correct x 2. Patient was awakened taken to recovery room in stable condition.  [name], MD 06/08/2017, 4:06 PM

## 2017-06-08 NOTE — H&P (Signed)
[name] is an 41 y.o. [phone] female.   Chief Complaint: Heavy menstrual bleeding [id number] has heavy bleeding. She has a significant h/o malnutrition and significant weight loss due to gastric bypass and persistent N/V. Previously had BTL and Novasure due to Mirena causing cramping and bleeding. On ultrasound, uterus measures 9.5 x 5.1 x 5.5 and has findings consistent with adenomyosis. She has a h/o SVD x 4.   Past Medical History:  Diagnosis Date  . Anal fissure - posterior 10/16/2014  . Anxiety    doesn't take anything  . Asthma    has Albuterol inhaler as needed  . Boil    on pubic area started septra 03-01-17 draining blood and pus  . Bronchitis   . Chronic headache disorder 07/22/2016  . Clostridium difficile infection 04/20/2012  . Colitis   . Dehydration   . Depression    doesn't take any meds  . Family history of adverse reaction to anesthesia    pt mom gets sick  . GERD (gastroesophageal reflux disease)    takes Pantoprazole daily  . Headache   . Hiatal hernia    neuropathy - mild in arms and legs  . History of blood transfusion    last transfusion was 04/04/2016=Benadryl was given d/t itching. States she always itches with transfusion.   Marland Kitchen History of bronchitis    > 2 yrs ago  . History of colon polyps    benign  . History of migraine    last one 05/01/16  . History of stomach ulcers   . History of urinary tract infection   . IDA (iron deficiency anemia)   . Internal and external bleeding hemorrhoids 06/11/2014  . Joint pain   . Joint swelling   . Left sided chronic colitis - segmental 06/11/2014  . Migraine   . Motion sickness   . Nausea    takes Zofran as needed  . Nausea and vomiting    for 1 year  . Obesity   . Oligouria   . Osteoarthritis    lower back, knees, wrists - no meds  . Pneumonia 1997  . Postoperative nausea and vomiting 01/21/2016   wants scopolamine patch  . SVD (spontaneous vaginal delivery)    x 4  . TOBACCO USER 10/02/2009   Qualifier: Diagnosis of  By: Dimas Millin MD, Ellard Artis    . Transfusion history    last transfusion 6'16   . UC (ulcerative colitis) ([place])    supposed to be taking Lialda and Bentyl but has been off since gastric sleeve  . Ulcerative colitis ([place])   . Vertigo    doesn't take any meds    Past Surgical History:  Procedure Laterality Date  . COLONOSCOPY  2007   for rectal bleeding; [facility]  . COLONOSCOPY N/A 06/11/2014   Procedure: COLONOSCOPY;  Surgeon: Gatha Mayer, MD;  Location: [facility];  Service: Endoscopy;  Laterality: N/A;  . COLONOSCOPY WITH PROPOFOL N/A 03/02/2017   Procedure: COLONOSCOPY WITH PROPOFOL;  Surgeon: Alphonsa Overall, MD;  Location: [facility];  Service: General;  Laterality: N/A;  . DILATION AND CURETTAGE OF UTERUS    . ESOPHAGOGASTRODUODENOSCOPY (EGD) WITH PROPOFOL N/A 04/06/2016   Procedure: ESOPHAGOGASTRODUODENOSCOPY (EGD) WITH PROPOFOL;  Surgeon: Alphonsa Overall, MD;  Location: [facility];  Service: General;  Laterality: N/A;  . ESOPHAGOGASTRODUODENOSCOPY (EGD) WITH PROPOFOL N/A 03/02/2017   Procedure: ESOPHAGOGASTRODUODENOSCOPY (EGD) WITH PROPOFOL;  Surgeon: Alphonsa Overall, MD;  Location: [facility];  Service: General;  Laterality: N/A;  . ESOPHAGOGASTRODUODENOSCOPY (  EGD) WITH PROPOFOL N/A 05/20/2017   Procedure: ESOPHAGOGASTRODUODENOSCOPY (EGD) WITH PROPOFOL ERAS PATHWAY;  Surgeon: Alphonsa Overall, MD;  Location: [facility];  Service: General;  Laterality: N/A;  . FOOT SURGERY Bilateral    x 2  . GASTRIC BYPASS    . GASTRIC ROUX-EN-Y N/A 12/14/2016   Procedure: LAPAROSCOPIC REVISION SLEEVE GASTRECTOMY TO  ROUX-Y-GASTRIC BY-PASS, UPPER ENDO;  Surgeon: Excell Seltzer, MD;  Location: [facility];  Service: General;  Laterality: N/A;  . GASTROJEJUNOSTOMY N/A 05/11/2016   Procedure: LAPAROSCOPIC PLACEMENT  OF FEEDING  JEJUNOSTOMY TUBE;  Surgeon: Excell Seltzer, MD;  Location: [facility];  Service: General;  Laterality: N/A;  . HEMORRHOIDECTOMY WITH HEMORRHOID BANDING    . IR  FLUORO GUIDE CV LINE RIGHT  04/06/2017  . IR GENERIC HISTORICAL  05/19/2016   IR CM INJ ANY COLONIC TUBE W/FLUORO 05/19/2016 Aletta Edouard, MD [facility]  . IR US GUIDE VASC ACCESS RIGHT  04/06/2017  . j tube removed march 2018    . KNEE ARTHROSCOPY Left 09/06/2014  . LAPAROSCOPIC GASTRIC SLEEVE RESECTION N/A 01/14/2016   Procedure: LAPAROSCOPIC GASTRIC SLEEVE RESECTION;  Surgeon: Excell Seltzer, MD;  Location: [facility];  Service: General;  Laterality: N/A;  . LAPAROSCOPIC TUBAL LIGATION  10/16/2011   Procedure: LAPAROSCOPIC TUBAL LIGATION;  Surgeon: Alwyn Pea, MD;  Location: [facility];  Service: Gynecology;  Laterality: Bilateral;  . NOVASURE ABLATION  09/28/2010   mild persistent vaginal bleeding  . right knee arthroscopy     05/07/2016  . TUBAL LIGATION    . wisdom teeth extracted      Family History  Problem Relation Age of Onset  . Hypertension Mother   . Diabetes Mother   . Sarcoidosis Mother        lungs and skin  . Asthma Mother   . Hypertension Father   . Diabetes Father   . Asthma Son   . Tics Son   . Asthma Sister   . Cancer Sister        possible pancreatic cancer  . Adrenal disorder Sister        Tumor   . Asthma Brother   . Asthma Daughter        died age 86.5  . Cancer Daughter 4       brain; died age 86.5  . Asthma Son   . Cancer Paternal Aunt        brain, colon, lung and esophagus; unsure of primary    Social History:  reports that she quit smoking about 3 years ago. Her smoking use included Cigarettes. She has a 10.00 pack-year smoking history. She has never used smokeless tobacco. She reports that she does not drink alcohol or use drugs.  Allergies:  Allergies  Allergen Reactions  . Food Anaphylaxis, Itching and Other (See Comments)    Pt is allergic to coconut.   . Morphine And Related Anaphylaxis and Other (See Comments)    Pt states that she has tolerated Norco and Dilaudid.  Has Hives.  . Oxycodone Anaphylaxis  . Ciprofloxacin Itching and Rash   . Doxycycline Nausea And Vomiting  . Adhesive [Tape] Rash    And paper tape causes a rash if wearing for a prolong period of time  . Penicillins Rash and Other (See Comments)    Has patient had a PCN reaction causing immediate rash, facial/tongue/throat swelling, SOB or lightheadedness with hypotension: No Has patient had a PCN reaction causing severe rash involving mucus membranes or skin necrosis: No Has  patient had a PCN reaction that required hospitalization No Has patient had a PCN reaction occurring within the last 10 years: No If all of the above answers are "NO", then may proceed with Cephalosporin use.    Current Facility-Administered Medications on File Prior to Encounter  Medication Dose Route Frequency Provider Last Rate Last Dose  . ondansetron (ZOFRAN-ODT) disintegrating tablet 4 mg  4 mg Oral Q4H PRN Excell Seltzer, MD       Or  . ondansetron (ZOFRAN) 4 mg in sodium chloride 0.9 % 50 mL IVPB  4 mg Intravenous Q4H PRN Excell Seltzer, MD   4 mg at 05/20/17 0813  . sodium chloride 0.9 % 1,000 mL with thiamine 326 mg, folic acid 1 mg, multivitamins adult 10 mL infusion   Intravenous Once Excell Seltzer, MD      . sodium chloride 0.9 % 1,000 mL with thiamine 712 mg, folic acid 1 mg, multivitamins adult 10 mL infusion   Intravenous Continuous Excell Seltzer, MD       Current Outpatient Prescriptions on File Prior to Encounter  Medication Sig Dispense Refill  . albuterol (PROVENTIL HFA;VENTOLIN HFA) 108 (90 Base) MCG/ACT inhaler Inhale 2 puffs into the lungs every 6 (six) hours as needed for wheezing or shortness of breath. For shortness of breath 54 g 0  . cyanocobalamin (,VITAMIN B-12,) 1000 MCG/ML injection Inject 1,000 mcg into the muscle every 30 (thirty) days.    . diclofenac sodium (VOLTAREN) 1 % GEL Apply 2 g topically 3 (three) times daily as needed (for knee pain). May use up to an additional 3 times daily as needed for pain    . famotidine (PEPCID) 40 MG  tablet Take 40 mg by mouth 2 (two) times daily as needed (for heartburn/indigestion.).     Marland Kitchen ondansetron (ZOFRAN-ODT) 8 MG disintegrating tablet Take 1 tablet (8 mg total) by mouth every 8 (eight) hours as needed for nausea. (Patient taking differently: Take 8 mg by mouth 2 (two) times daily as needed for nausea. ) 20 tablet 3  . pantoprazole (PROTONIX) 40 MG tablet Take 40 mg by mouth 2 (two) times daily.     . promethazine (PHENERGAN) 25 MG tablet Take 1 tablet (25 mg total) by mouth every 6 (six) hours as needed for nausea or vomiting. 30 tablet 1  . sucralfate (CARAFATE) 1 g tablet Take 1 tablet (1 g total) by mouth 4 (four) times daily. 30 tablet 0  . thiamine (VITAMIN B-1) 100 MG tablet Take 100 mg by mouth daily.     Marland Kitchen EPINEPHrine (EPIPEN 2-PAK) 0.3 mg/0.3 mL IJ SOAJ injection Inject 0.3 mg into the muscle once as needed (for severe allergic reaction).    . fluticasone (FLONASE) 50 MCG/ACT nasal spray Place 1 spray into both nostrils daily as needed for allergies or rhinitis. Reported on 04/01/2016      A comprehensive review of systems was negative.  Blood pressure 121/81, pulse 69, temperature 98.2 F (36.8 C), temperature source Oral, resp. rate 16, last menstrual period 06/02/2017, SpO2 100 %. General appearance: alert, cooperative and appears stated age Head: Normocephalic, without obvious abnormality, atraumatic Neck: supple, symmetrical, trachea midline Lungs: normal effort Heart: regular rate and rhythm Abdomen: soft, non-tender Extremities: Homans sign is negative, no sign of DVT Skin: Skin color, texture, turgor normal. No rashes or lesions Neurologic: Grossly normal   Lab Results  Component Value Date   WBC 6.6 05/28/2017   HGB 9.6 (L) 05/28/2017   HCT 29.6 (L) 05/28/2017  MCV 81.1 05/28/2017   PLT 315 05/28/2017   Lab Results  Component Value Date   PREGTESTUR NEGATIVE 06/08/2017   PREGSERUM NEGATIVE 12/10/2016     Assessment/Plan Principal Problem:    Excessive or frequent menstruation  For TVH with bilateral salpingectomy Risks include but are not limited to bleeding, infection, injury to surrounding structures, including bowel, bladder and ureters, blood clots, and death.  Likelihood of success is high.    [name] 06/08/2017, 2:11 PM

## 2017-06-08 NOTE — Anesthesia Postprocedure Evaluation (Signed)
Anesthesia Post Note  Patient: [name]  Procedure(s) Performed: Procedure(s) (LRB): HYSTERECTOMY VAGINAL W/ BILATERAL SALPINGECTOMY (Bilateral) EXCISION OF VULVAR SKIN TAGS X2     Patient location during evaluation: [facility] Anesthesia Type: General Level of consciousness: awake and alert and oriented Pain management: pain level controlled Vital Signs Assessment: post-procedure vital signs reviewed and stable Respiratory status: spontaneous breathing, nonlabored ventilation, respiratory function stable and patient connected to nasal cannula oxygen Cardiovascular status: stable Postop Assessment: no signs of nausea or vomiting and adequate PO intake Anesthetic complications: no    Last Vitals:  Vitals:   06/08/17 1730 06/08/17 1830  BP:  116/63  Pulse:  67  Resp: 16 16  Temp:  36.8 C  SpO2:  100%    Last Pain:  Vitals:   06/08/17 1745  TempSrc:   PainSc: Asleep   Pain Goal: Patients Stated Pain Goal: 4 (06/08/17 1313)               Willa Rough

## 2017-06-08 NOTE — Addendum Note (Signed)
Addendum  created 06/08/17 1936 by Jonna Munro, CRNA   Sign clinical note

## 2017-06-08 NOTE — Interval H&P Note (Signed)
History and Physical Interval Note:  06/08/2017 2:12 PM  [name]  has presented today for surgery, with the diagnosis of EXCESSIVE, FREQUENT MENSTRUATION  The various methods of treatment have been discussed with the patient and family. After consideration of risks, benefits and other options for treatment, the patient has consented to  Procedure(s): HYSTERECTOMY VAGINAL W/ BILATERAL SALPINGECTOMY (Bilateral) as a surgical intervention .  The patient's history has been reviewed, patient examined, no change in status, stable for surgery.  I have reviewed the patient's chart and labs.  Questions were answered to the patient's satisfaction.     [name]

## 2017-06-09 ENCOUNTER — Encounter (HOSPITAL_COMMUNITY): Payer: Self-pay | Admitting: Family Medicine

## 2017-06-09 DIAGNOSIS — N920 Excessive and frequent menstruation with regular cycle: Secondary | ICD-10-CM | POA: Diagnosis not present

## 2017-06-09 LAB — CBC
HCT: 23.3 % — ABNORMAL LOW (ref 36.0–46.0)
HEMOGLOBIN: 7.3 g/dL — AB (ref 12.0–15.0)
MCH: 25.7 pg — AB (ref 26.0–34.0)
MCHC: 31.3 g/dL (ref 30.0–36.0)
MCV: 82 fL (ref 78.0–100.0)
Platelets: 256 10*3/uL (ref 150–400)
RBC: 2.84 MIL/uL — AB (ref 3.87–5.11)
RDW: 16.8 % — ABNORMAL HIGH (ref 11.5–15.5)
WBC: 10.7 10*3/uL — ABNORMAL HIGH (ref 4.0–10.5)

## 2017-06-09 MED ORDER — LACTATED RINGERS IV BOLUS (SEPSIS)
500.0000 mL | Freq: Once | INTRAVENOUS | Status: AC
  Administered 2017-06-09: 500 mL via INTRAVENOUS

## 2017-06-09 MED ORDER — HYDROMORPHONE HCL 2 MG PO TABS: 2.0000 mg | ORAL_TABLET | ORAL | 0 refills | Status: DC | PRN

## 2017-06-09 MED ORDER — THIAMINE HCL 100 MG/ML IJ SOLN
INTRAVENOUS | Status: DC
  Administered 2017-06-09: 10:00:00 via INTRAVENOUS
  Filled 2017-06-09: qty 1000

## 2017-06-09 NOTE — Discharge Instructions (Signed)
Vaginal Hysterectomy, Care After This sheet gives you information about how to care for yourself after your procedure. Your doctor may also give you more specific instructions. If you have problems or questions, contact your doctor. Follow these instructions at home: Bathing  Do not take baths, swim, or use a hot tub until your doctor says it is okay. Ask your doctor if you can take showers. You may only be allowed to take sponge baths for bathing.  Keep the bandage (dressing) dry until your doctor says it can be taken off. Surgical cut ( incision) care  Follow instructions from your doctor about how to take care of your cut from surgery. Make sure you: ? Wash your hands with soap and water before you change your bandage (dressing). If you cannot use soap and water, use hand sanitizer. ? Change your bandage as told by your doctor. ? Leave stitches (sutures), skin glue, or skin tape (adhesive) strips in place. They may need to stay in place for 2 weeks or longer. If tape strips get loose and curl up, you may trim the loose edges. Do not remove tape strips completely unless your doctor says it is okay.  Check your surgical cut area every day for signs of infection. Check for: ? Redness, swelling, or pain. ? Fluid or blood. ? Warmth. ? Pus or a bad smell. Activity  Do gentle, daily exercise as told by your doctor. You may be told to take short walks every day and go farther each time.  Do not lift anything that is heavier than 20 lb (4.5 kg), or the limit that your doctor tells you, until he or she says that it is safe.  Do not drive or use heavy machinery while taking prescription pain medicine.  Do not drive for 24 hours if you were given a medicine to help you relax (sedative).  Follow your doctor's advice about exercise, driving, and general activities. Ask your doctor what activities are safe for you. Lifestyle  Do not douche, use tampons, or have sex for at least 6 weeks or as  told by your doctor.  Do not drink alcohol until your doctor says it is okay.  Drink enough fluid to keep your pee (urine) clear or pale yellow.  Try to have someone at home with you for the first 1-2 weeks to help.  Do not use any products that contain nicotine or tobacco, such as cigarettes and e-cigarettes. These can slow down healing. If you need help quitting, ask your doctor. General instructions  Take over-the-counter and prescription medicines only as told by your doctor.  Do not take aspirin or ibuprofen. These medicines can cause bleeding.  To prevent or treat constipation while you are taking prescription pain medicine, your doctor may suggest that you: ? Drink enough fluid to keep your urine clear or pale yellow. ? Take over-the-counter or prescription medicines. ? Eat foods that are high in fiber, such as:  Fresh fruits and vegetables.  Whole grains.  Beans. ? Limit foods that are high in fat and processed sugars, such as fried and sweet foods.  Keep all follow-up visits as told by your doctor. This is important. Contact a doctor if:  You have chills or fever.  You have redness, swelling, or pain around your cut.  You have fluid or blood coming from your cut.  Your cut feels warm to the touch.  You have pus or a bad smell coming from your cut.  Your cut breaks  open.  You feel dizzy or light-headed.  You have pain or bleeding when you pee.  You keep having watery poop (diarrhea).  You keep feeling sick to your stomach (nauseous) or keep throwing up (vomiting).  You have unusual fluid (discharge) coming from your vagina.  You have a rash.  You have a reaction to your medicine.  Your pain medicine does not help. Get help right away if:  You have a fever and your symptoms get worse all of a sudden.  You have very bad belly (abdominal) pain.  You are short of breath.  You pass out (faint).  You have pain, swelling, or redness of your  leg.  You bleed a lot from your vagina and notice clumps of blood (clots). Summary  Do not take baths, swim, or use a hot tub until your doctor says it is okay. Ask your doctor if you can take showers. You may only be allowed to take sponge baths for bathing.  Follow your doctor's advice about exercise, driving, and general activities. Ask your doctor what activities are safe for you.  Do not lift anything that is heavier than 10 lb (4.5 kg), or the limit that your doctor tells you, until he or she says that it is safe.  Try to have someone at home with you for the first 1-2 weeks to help. This information is not intended to replace advice given to you by your health care provider. Make sure you discuss any questions you have with your health care provider. Document Released: 06/23/2008 Document Revised: 09/02/2016 Document Reviewed: 09/02/2016 Elsevier Interactive Patient Education  2017 [employer].

## 2017-06-09 NOTE — Progress Notes (Signed)
Bladder scan showed 333m. Pt has not walked today. Advised pt to walk a few laps around the unit and sit on toilet to try void. If unable to void will in and out cath.

## 2017-06-09 NOTE — Discharge Summary (Addendum)
Physician Discharge Summary  Patient ID: [name] MRN: [id number] DOB/AGE: 39/30/1977 39 y.o.  Admit date: 06/08/2017 Discharge date:  06/10/2017   Admission Diagnoses:  Principal Problem:   Excessive or frequent menstruation Active Problems:   S/P vaginal hysterectomy   Discharge Diagnoses:  Same  Past Medical History:  Diagnosis Date  . Anal fissure - posterior 10/16/2014  . Anxiety    doesn't take anything  . Asthma    has Albuterol inhaler as needed  . Boil    on pubic area started septra 03-01-17 draining blood and pus  . Bronchitis   . Chronic headache disorder 07/22/2016  . Clostridium difficile infection 04/20/2012  . Colitis   . Dehydration   . Depression    doesn't take any meds  . Family history of adverse reaction to anesthesia    pt mom gets sick  . GERD (gastroesophageal reflux disease)    takes Pantoprazole daily  . Headache   . Hiatal hernia    neuropathy - mild in arms and legs  . History of blood transfusion    last transfusion was 04/04/2016=Benadryl was given d/t itching. States she always itches with transfusion.   Marland Kitchen History of bronchitis    > 2 yrs ago  . History of colon polyps    benign  . History of migraine    last one 05/01/16  . History of stomach ulcers   . History of urinary tract infection   . IDA (iron deficiency anemia)   . Internal and external bleeding hemorrhoids 06/11/2014  . Joint pain   . Joint swelling   . Left sided chronic colitis - segmental 06/11/2014  . Migraine   . Motion sickness   . Nausea    takes Zofran as needed  . Nausea and vomiting    for 1 year  . Obesity   . Oligouria   . Osteoarthritis    lower back, knees, wrists - no meds  . Pneumonia 1997  . Postoperative nausea and vomiting 01/21/2016   wants scopolamine patch  . SVD (spontaneous vaginal delivery)    x 4  . TOBACCO USER 10/02/2009   Qualifier: Diagnosis of  By: Dimas Millin MD, Ellard Artis    . Transfusion history    last transfusion 6'16   . UC  (ulcerative colitis) ([place])    supposed to be taking Lialda and Bentyl but has been off since gastric sleeve  . Ulcerative colitis ([place])   . Vertigo    doesn't take any meds    Surgeries: Procedure(s): HYSTERECTOMY VAGINAL W/ BILATERAL SALPINGECTOMY EXCISION OF VULVAR SKIN TAGS X2 on 06/08/2017   Consultants: None  Discharged Condition: [place] Hospital Course: [name] is an 41 y.o. female [phone] who was admitted 06/08/2017 with a chief complaint of No chief complaint on file. , and found to have a diagnosis of Excessive or frequent menstruation.  They were brought to the operating room on 06/08/2017 and underwent the above named procedures.    They were given perioperative antibiotics:  Anti-infectives    Start     Dose/Rate Route Frequency Ordered Stop   06/08/17 1333  ceFAZolin (ANCEF) 2-4 GM/100ML-% IVPB    Comments:  Moody, Kamron   : cabinet override      06/08/17 1333 06/08/17 1440   06/08/17 1330  ceFAZolin (ANCEF) IVPB 2g/100 mL premix     2 g 200 mL/hr over 30 Minutes Intravenous On call to O.R. 06/08/17 1320 06/08/17 1450    .  They were given sequential compression devices, early ambulation, and SCD for DVT prophylaxis. She was voiding, ambulating and tolerating po. She received a banana bag while in house. She was unable to void on POD#1 and was re-catheterized. Catheter removed and voided twice on POD#2 and was stable for discharge. They benefited maximally from their hospital stay and there were no complications.     Recent vital signs:  Blood pressure 108/64, pulse 81, temperature 98.5 F (36.9 C), temperature source Oral, resp. rate 18, last menstrual period 06/02/2017, SpO2 100 %. Discharge Physical Examination: General appearance - alert, well appearing, and in no distress Chest - normal effort Abdomen - soft appropriately tender Extremities - Homan's sign negative bilaterally   Recent laboratory studies:  Results for orders placed or performed  during the hospital encounter of 06/08/17  Pregnancy, urine  Result Value Ref Range   Preg Test, Ur NEGATIVE NEGATIVE   CBC Latest Ref Rng & Units 06/09/2017 05/28/2017 05/19/2017  WBC 4.0 - 10.5 K/uL 10.7(H) 6.6 5.8  Hemoglobin 12.0 - 15.0 g/dL 7.3(L) 9.6(L) 10.4(L)  Hematocrit 36.0 - 46.0 % 23.3(L) 29.6(L) 34.5  Platelets 150 - 400 K/uL 256 315 342    Discharge Medications:   Allergies as of 06/10/2017      Reactions   Food Anaphylaxis, Itching, Other (See Comments)   Pt is allergic to coconut.    Morphine And Related Anaphylaxis, Other (See Comments)   Pt states that she has tolerated Norco and Dilaudid.  Has Hives.   Oxycodone Anaphylaxis   Ciprofloxacin Itching, Rash   Doxycycline Nausea And Vomiting   Adhesive [tape] Rash   And paper tape causes a rash if wearing for a prolong period of time   Penicillins Rash, Other (See Comments)   Has patient had a PCN reaction causing immediate rash, facial/tongue/throat swelling, SOB or lightheadedness with hypotension: No Has patient had a PCN reaction causing severe rash involving mucus membranes or skin necrosis: No Has patient had a PCN reaction that required hospitalization No Has patient had a PCN reaction occurring within the last 10 years: No If all of the above answers are "NO", then may proceed with Cephalosporin use.      Medication List    TAKE these medications   acetaminophen 650 MG CR tablet Commonly known as:  TYLENOL Take 650 mg by mouth every 6 (six) hours as needed for pain.   acetaminophen 500 MG tablet Commonly known as:  TYLENOL Take 500 mg by mouth every 6 (six) hours as needed (for pain.).   albuterol 108 (90 Base) MCG/ACT inhaler Commonly known as:  PROVENTIL HFA;VENTOLIN HFA Inhale 2 puffs into the lungs every 6 (six) hours as needed for wheezing or shortness of breath. For shortness of breath   CALCIUM CITRATE PO Take 10 mLs by mouth daily.   cyanocobalamin 1000 MCG/ML injection Commonly known as:   (VITAMIN B-12) Inject 1,000 mcg into the muscle every 30 (thirty) days.   diclofenac sodium 1 % Gel Commonly known as:  VOLTAREN Apply 2 g topically 3 (three) times daily as needed (for knee pain). May use up to an additional 3 times daily as needed for pain   EPIPEN 2-PAK 0.3 mg/0.3 mL Soaj injection Generic drug:  EPINEPHrine Inject 0.3 mg into the muscle once as needed (for severe allergic reaction).   famotidine 40 MG tablet Commonly known as:  PEPCID Take 40 mg by mouth 2 (two) times daily as needed (for heartburn/indigestion.).   fluticasone 50 MCG/ACT nasal  spray Commonly known as:  FLONASE Place 1 spray into both nostrils daily as needed for allergies or rhinitis. Reported on 04/01/2016   hydrocortisone 25 MG suppository Commonly known as:  ANUSOL-HC Place 1 suppository (25 mg total) rectally 2 (two) times daily.   HYDROmorphone 2 MG tablet Commonly known as:  DILAUDID Take 1 tablet (2 mg total) by mouth every 3 (three) hours as needed for moderate pain or severe pain.   LINZESS 290 MCG Caps capsule Generic drug:  linaclotide Take 290 mcg by mouth daily before breakfast.   metroNIDAZOLE 500 MG tablet Commonly known as:  FLAGYL Take 1 tablet (500 mg total) by mouth 2 (two) times daily with a meal. DO NOT CONSUME ALCOHOL WHILE TAKING THIS MEDICATION.   MULTIVITAMIN ADULT Chew Chew 1 tablet by mouth 2 (two) times daily.   ondansetron 8 MG disintegrating tablet Commonly known as:  ZOFRAN-ODT Take 1 tablet (8 mg total) by mouth every 8 (eight) hours as needed for nausea. What changed:  when to take this   pantoprazole 40 MG tablet Commonly known as:  PROTONIX Take 40 mg by mouth 2 (two) times daily.   promethazine 25 MG tablet Commonly known as:  PHENERGAN Take 1 tablet (25 mg total) by mouth every 6 (six) hours as needed for nausea or vomiting.   sucralfate 1 g tablet Commonly known as:  CARAFATE Take 1 tablet (1 g total) by mouth 4 (four) times daily.    thiamine 100 MG tablet Commonly known as:  VITAMIN B-1 Take 100 mg by mouth daily.   thiamine 496 mg, folic acid 1 mg, multivitamins adult 10 mL in sodium chloride 0.9 % 1,000 mL Inject 1,000 mLs into the vein every Wednesday. [facility] Instructions        Start     Ordered   06/10/17 0000  hydrocortisone (ANUSOL-HC) 25 MG suppository  2 times daily     06/10/17 1114   06/09/17 0000  HYDROmorphone (DILAUDID) 2 MG tablet  Every  3 hours PRN     06/09/17 0807   06/09/17 0000  Diet - low sodium heart healthy     06/09/17 0807   06/09/17 0000  Increase activity slowly     06/09/17 0807   06/09/17 0000  Driving Restrictions    Comments:  None while on narcotic pain meds   06/09/17 0807   06/09/17 0000  Lifting restrictions    Comments:  Nothing > 20 lbs x 4-6 wks   06/09/17 0807   06/09/17 0000  Sexual Activity Restrictions    Comments:  None x 6 wks   06/09/17 0807   06/09/17 0000  Call MD for:  temperature >100.4     06/09/17 0807   06/09/17 0000  Call MD for:  persistant nausea and vomiting     06/09/17 0807   06/09/17 0000  Call MD for:  severe uncontrolled pain     06/09/17 0807   06/09/17 0000  Call MD for:  difficulty breathing, headache or visual disturbances     06/09/17 0807   06/09/17 0000  No wound care     06/09/17 7591       Diagnostic Studies: Dg Chest Port 1 View  Result Date: 05/20/2017 CLINICAL DATA:  PICC line. EXAM: PORTABLE CHEST 1 VIEW COMPARISON:  04/06/2017 . FINDINGS: PICC line noted with its tip over the upper superior vena cava.  Mediastinum and hilar structures normal. Lungs are clear. Heart size stable. No pleural effusion or pneumothorax. IMPRESSION: PICC line noted with tip over upper superior vena cava. No acute cardiopulmonary disease. Electronically Signed   By: Marcello Moores  Register   On: 05/20/2017 09:44    Disposition: 01-Home or Self Care  Discharge Instructions    Call MD for:   difficulty breathing, headache or visual disturbances    Complete by:  As directed    Call MD for:  persistant nausea and vomiting    Complete by:  As directed    Call MD for:  severe uncontrolled pain    Complete by:  As directed    Call MD for:  temperature >100.4    Complete by:  As directed    Diet - low sodium heart healthy    Complete by:  As directed    Driving Restrictions    Complete by:  As directed    None while on narcotic pain meds   Increase activity slowly    Complete by:  As directed    Lifting restrictions    Complete by:  As directed    Nothing > 20 lbs x 4-6 wks   No wound care    Complete by:  As directed    Sexual Activity Restrictions    Complete by:  As directed    None x 6 wks      Follow-up [place] for [place] Follow up in 2 week(s).   Specialty:  Obstetrics and Gynecology Why:  they will call to make an appointment Contact information: [address] [place] [phone]           Signed: [name] 06/10/2017 11:16 AM

## 2017-06-09 NOTE — Progress Notes (Signed)
PC  Dilaudid stopped.

## 2017-06-09 NOTE — Progress Notes (Signed)
Called re: continued inability to void; will check bladder scan again, will place catheter for volume >300 cc.

## 2017-06-09 NOTE — Progress Notes (Signed)
Assisted up to bathroom. Still unable to void. Bladder scan volume 89, 108, and 102. Notified on-call provider for faculty practice.

## 2017-06-10 ENCOUNTER — Telehealth: Payer: Self-pay | Admitting: General Practice

## 2017-06-10 ENCOUNTER — Telehealth: Payer: Self-pay | Admitting: *Deleted

## 2017-06-10 DIAGNOSIS — N920 Excessive and frequent menstruation with regular cycle: Secondary | ICD-10-CM | POA: Diagnosis not present

## 2017-06-10 MED ORDER — HYDROCORTISONE ACETATE 25 MG RE SUPP
25.0000 mg | Freq: Two times a day (BID) | RECTAL | Status: DC
  Administered 2017-06-10: 25 mg via RECTAL
  Filled 2017-06-10 (×3): qty 1

## 2017-06-10 MED ORDER — HYDROCORTISONE ACETATE 25 MG RE SUPP: 25.0000 mg | Freq: Two times a day (BID) | RECTAL | 0 refills | Status: DC

## 2017-06-10 MED ORDER — HEPARIN SOD (PORK) LOCK FLUSH 100 UNIT/ML IV SOLN
250.0000 [IU] | INTRAVENOUS | Status: AC | PRN
  Administered 2017-06-10: 250 [IU]
  Filled 2017-06-10: qty 3

## 2017-06-10 NOTE — Telephone Encounter (Signed)
Called and left message on VM in regards to Post op appointment with Dr. Kennon Rounds on 06/23/17 at 8:40am.  Asked patient to give our office a call if unable to keep this appointment.

## 2017-06-10 NOTE — Telephone Encounter (Signed)
Received a voicemail from 06/09/17 am from [employer] in [place] from nurse case manaement for [name] re: disability plan for patient of Dr. Kennon Rounds.  Need to verify if she had surgery for vaginal hysterectomy and if she has a post op appt.

## 2017-06-10 NOTE — Progress Notes (Signed)
Pt d/c home with family. D/C paperwork and prescriptions, home care and follow up appointments discussed and patient stated understanding. No questions at this time.

## 2017-06-11 NOTE — Telephone Encounter (Signed)
Patient does have a post op appointment schedule for 06/23/2017. However there is no number to call the patient back.

## 2017-06-14 ENCOUNTER — Emergency Department (HOSPITAL_COMMUNITY): Payer: 59

## 2017-06-14 ENCOUNTER — Other Ambulatory Visit: Payer: Self-pay

## 2017-06-14 ENCOUNTER — Encounter (HOSPITAL_COMMUNITY): Payer: Self-pay | Admitting: *Deleted

## 2017-06-14 ENCOUNTER — Emergency Department (HOSPITAL_COMMUNITY)
Admission: EM | Admit: 2017-06-14 | Discharge: 2017-06-14 | Disposition: A | Discharge status: Home / Self Care | Payer: 59 | Attending: Emergency Medicine | Admitting: Emergency Medicine

## 2017-06-14 DIAGNOSIS — R0602 Shortness of breath: Secondary | ICD-10-CM | POA: Diagnosis not present

## 2017-06-14 DIAGNOSIS — R0789 Other chest pain: Secondary | ICD-10-CM | POA: Diagnosis not present

## 2017-06-14 DIAGNOSIS — N3000 Acute cystitis without hematuria: Secondary | ICD-10-CM | POA: Insufficient documentation

## 2017-06-14 DIAGNOSIS — R110 Nausea: Secondary | ICD-10-CM | POA: Insufficient documentation

## 2017-06-14 DIAGNOSIS — T819XXA Unspecified complication of procedure, initial encounter: Secondary | ICD-10-CM | POA: Diagnosis not present

## 2017-06-14 DIAGNOSIS — R42 Dizziness and giddiness: Secondary | ICD-10-CM | POA: Insufficient documentation

## 2017-06-14 DIAGNOSIS — Y69 Unspecified misadventure during surgical and medical care: Secondary | ICD-10-CM | POA: Insufficient documentation

## 2017-06-14 DIAGNOSIS — R079 Chest pain, unspecified: Secondary | ICD-10-CM | POA: Diagnosis not present

## 2017-06-14 DIAGNOSIS — Z880 Allergy status to penicillin: Secondary | ICD-10-CM | POA: Diagnosis not present

## 2017-06-14 DIAGNOSIS — Z885 Allergy status to narcotic agent status: Secondary | ICD-10-CM | POA: Diagnosis not present

## 2017-06-14 DIAGNOSIS — Z87891 Personal history of nicotine dependence: Secondary | ICD-10-CM | POA: Insufficient documentation

## 2017-06-14 DIAGNOSIS — J45909 Unspecified asthma, uncomplicated: Secondary | ICD-10-CM | POA: Diagnosis not present

## 2017-06-14 DIAGNOSIS — R109 Unspecified abdominal pain: Secondary | ICD-10-CM | POA: Diagnosis not present

## 2017-06-14 LAB — URINALYSIS, ROUTINE W REFLEX MICROSCOPIC
Bilirubin Urine: NEGATIVE
Glucose, UA: NEGATIVE mg/dL
KETONES UR: NEGATIVE mg/dL
Nitrite: POSITIVE — AB
PH: 5 (ref 5.0–8.0)
Protein, ur: 30 mg/dL — AB
Specific Gravity, Urine: >1.046 — ABNORMAL HIGH (ref 1.005–1.030)

## 2017-06-14 LAB — BASIC METABOLIC PANEL
Anion gap: 9 (ref 5–15)
BUN: 12 mg/dL (ref 6–20)
CALCIUM: 8.7 mg/dL — AB (ref 8.9–10.3)
CO2: 20 mmol/L — ABNORMAL LOW (ref 22–32)
CREATININE: 0.7 mg/dL (ref 0.44–1.00)
Chloride: 107 mmol/L (ref 101–111)
GFR calc non Af Amer: >60 mL/min (ref >60)
Glucose, Bld: 93 mg/dL (ref 65–99)
Potassium: 3.6 mmol/L (ref 3.5–5.1)
Sodium: 136 mmol/L (ref 135–145)

## 2017-06-14 LAB — I-STAT TROPONIN, ED: TROPONIN I, POC: 0 ng/mL (ref 0.00–0.08)

## 2017-06-14 LAB — CBC
HCT: 28.4 % — ABNORMAL LOW (ref 36.0–46.0)
Hemoglobin: 8.7 g/dL — ABNORMAL LOW (ref 12.0–15.0)
MCH: 24.9 pg — AB (ref 26.0–34.0)
MCHC: 30.6 g/dL (ref 30.0–36.0)
MCV: 81.1 fL (ref 78.0–100.0)
PLATELETS: 323 10*3/uL (ref 150–400)
RBC: 3.5 MIL/uL — AB (ref 3.87–5.11)
RDW: 17 % — ABNORMAL HIGH (ref 11.5–15.5)
WBC: 8.7 10*3/uL (ref 4.0–10.5)

## 2017-06-14 LAB — I-STAT CG4 LACTIC ACID, ED: LACTIC ACID, VENOUS: 0.5 mmol/L (ref 0.5–1.9)

## 2017-06-14 MED ORDER — IOPAMIDOL (ISOVUE-370) INJECTION 76%
INTRAVENOUS | Status: AC
  Administered 2017-06-14: 100 mL
  Filled 2017-06-14: qty 100

## 2017-06-14 MED ORDER — ONDANSETRON HCL 4 MG/2ML IJ SOLN
4.0000 mg | Freq: Once | INTRAMUSCULAR | Status: AC
  Administered 2017-06-14: 4 mg via INTRAVENOUS
  Filled 2017-06-14: qty 2

## 2017-06-14 MED ORDER — HYDROMORPHONE HCL 1 MG/ML IJ SOLN
1.0000 mg | Freq: Once | INTRAMUSCULAR | Status: AC
  Administered 2017-06-14: 1 mg via INTRAVENOUS
  Filled 2017-06-14: qty 1

## 2017-06-14 MED ORDER — NITROFURANTOIN MONOHYD MACRO 100 MG PO CAPS: 100.0000 mg | ORAL_CAPSULE | Freq: Two times a day (BID) | ORAL | 0 refills | Status: DC

## 2017-06-14 MED ORDER — SODIUM CHLORIDE 0.9 % IV BOLUS (SEPSIS)
1000.0000 mL | Freq: Once | INTRAVENOUS | Status: AC
  Administered 2017-06-14: 1000 mL via INTRAVENOUS

## 2017-06-14 NOTE — ED Triage Notes (Addendum)
Pt is here with 2 days of lightheadedness and dizziness and in the last 2 hours has been having chest pressure.  Pt got 376m and 2 nitro SL and now at 3/10.  No history of heart disease.  EKG did not appear abnormal per ems. CBG 115.  Hysterectomy 2 days ago and did have complaint of hard to breath at times.  Pt has a picc line

## 2017-06-14 NOTE — ED Notes (Signed)
Pt up to bathroom.

## 2017-06-14 NOTE — Discharge Instructions (Signed)
Please take nitrofurantoin by mouth 2 times daily for the next 5 days for a urinary tract infection.  Please keep your appointment in 2 weeks with Dr. Kennon Rounds.  If you develop new or worsening symptoms including fever, chills, worsening lightheadedness or dizziness, or new symptoms, please return to the emergency department for reevaluation.

## 2017-06-14 NOTE — ED Notes (Signed)
Pt states she has a tickle in her throat. Airway open and pt appears to be breathing wnl. Skin w/d, resp e/u. Water given

## 2017-06-14 NOTE — ED Provider Notes (Signed)
[facility] DEPT Provider Note   CSN: [id number] Arrival date & time: 06/14/17  1346     History   Chief Complaint Chief Complaint  Patient presents with  . Chest Pain  . Dizziness    HPI [name] is a 41 y.o. female with a history of hysterectomy on 06/08/17 who presents to the emergency department with a chief complaint of lightheadedness that began 2 days ago with associated dizziness and left-sided constant, non-radiating chest pressure that began 2 hours prior to arrival. She also complains of intermittent dyspnea and headache. She denies fever or chills. No vaginal bleeding, discharge, or vaginal pain.  The patient was transported to the emergency department by EMS and was given 324 mg of aspirin and 2 nitroglycerin SL in route with improvement of her chest discomfort.   The patient has a history of a gastric sleeve and gastric bypass. The patient reports that she has a history of chronic vomiting which she eats food by mouth. She has a PICC line in place to receive parenteral nutrition. She denies worsening baseline emesis.  The history is provided by the patient. No language interpreter was used.    Past Medical History:  Diagnosis Date  . Anal fissure - posterior 10/16/2014  . Anxiety    doesn't take anything  . Asthma    has Albuterol inhaler as needed  . Boil    on pubic area started septra 03-01-17 draining blood and pus  . Bronchitis   . Chronic headache disorder 07/22/2016  . Clostridium difficile infection 04/20/2012  . Colitis   . Dehydration   . Depression    doesn't take any meds  . Family history of adverse reaction to anesthesia    pt mom gets sick  . GERD (gastroesophageal reflux disease)    takes Pantoprazole daily  . Headache   . Hiatal hernia    neuropathy - mild in arms and legs  . History of blood transfusion    last transfusion was 04/04/2016=Benadryl was given d/t itching. States she always itches with transfusion.   Marland Kitchen History of  bronchitis    > 2 yrs ago  . History of colon polyps    benign  . History of migraine    last one 05/01/16  . History of stomach ulcers   . History of urinary tract infection   . IDA (iron deficiency anemia)   . Internal and external bleeding hemorrhoids 06/11/2014  . Joint pain   . Joint swelling   . Left sided chronic colitis - segmental 06/11/2014  . Migraine   . Motion sickness   . Nausea    takes Zofran as needed  . Nausea and vomiting    for 1 year  . Obesity   . Oligouria   . Osteoarthritis    lower back, knees, wrists - no meds  . Pneumonia 1997  . Postoperative nausea and vomiting 01/21/2016   wants scopolamine patch  . SVD (spontaneous vaginal delivery)    x 4  . TOBACCO USER 10/02/2009   Qualifier: Diagnosis of  By: Dimas Millin MD, Ellard Artis    . Transfusion history    last transfusion 6'16   . UC (ulcerative colitis) ([place])    supposed to be taking Lialda and Bentyl but has been off since gastric sleeve  . Ulcerative colitis ([place])   . Vertigo    doesn't take any meds    Patient Active Problem List   Diagnosis Date Noted  . S/P vaginal hysterectomy  06/08/2017  . Dehydration 04/02/2017  . Malnutrition, calorie ([place]) 03/27/2017  . Psychophysiological insomnia 03/18/2017  . Nausea and vomiting 12/14/2016  . Paresthesia 07/22/2016  . Chronic headache disorder 07/22/2016  . Memory difficulty 07/22/2016  . Hematochezia 04/04/2016  . 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 Postoperative nausea and vomiting 01/21/2016  . Anemia, iron deficiency 10/01/2015  . Skin lesion 05/18/2015  . Anal fissure - posterior 10/16/2014  . Left sided chronic colitis - segmental 06/11/2014  . Internal and external bleeding hemorrhoids 06/11/2014  . Asthma 04/26/2012  . Excessive or frequent menstruation 10/16/2011  . DEPRESSION, MILD 10/02/2009    Past Surgical History:  Procedure Laterality Date  . ABDOMINAL HYSTERECTOMY    . COLONOSCOPY  2007   for rectal bleeding; [facility]  . COLONOSCOPY N/A 06/11/2014   Procedure: COLONOSCOPY;  Surgeon: Gatha Mayer, MD;  Location: [facility];  Service: Endoscopy;  Laterality: N/A;  . COLONOSCOPY WITH PROPOFOL N/A 03/02/2017   Procedure: COLONOSCOPY WITH PROPOFOL;  Surgeon: Alphonsa Overall, MD;  Location: [facility];  Service: General;  Laterality: N/A;  . DILATION AND CURETTAGE OF UTERUS    . ESOPHAGOGASTRODUODENOSCOPY (EGD) WITH PROPOFOL N/A 04/06/2016   Procedure: ESOPHAGOGASTRODUODENOSCOPY (EGD) WITH PROPOFOL;  Surgeon: Alphonsa Overall, MD;  Location: [facility];  Service: General;  Laterality: N/A;  . ESOPHAGOGASTRODUODENOSCOPY (EGD) WITH PROPOFOL N/A 03/02/2017   Procedure: ESOPHAGOGASTRODUODENOSCOPY (EGD) WITH PROPOFOL;  Surgeon: Alphonsa Overall, MD;  Location: [facility];  Service: General;  Laterality: N/A;  . ESOPHAGOGASTRODUODENOSCOPY (EGD) WITH PROPOFOL N/A 05/20/2017   Procedure: ESOPHAGOGASTRODUODENOSCOPY (EGD) WITH PROPOFOL ERAS PATHWAY;  Surgeon: Alphonsa Overall, MD;  Location: [facility];  Service: General;  Laterality: N/A;  . EXCISION OF SKIN TAG  06/08/2017   Procedure: EXCISION OF VULVAR SKIN TAGS X2;  Surgeon: [name], MD;  Location: [facility];  Service: Gynecology;;  . FOOT SURGERY Bilateral    x 2  . GASTRIC BYPASS    . GASTRIC ROUX-EN-Y N/A 12/14/2016   Procedure: LAPAROSCOPIC REVISION SLEEVE GASTRECTOMY TO  ROUX-Y-GASTRIC BY-PASS, UPPER ENDO;  Surgeon: Excell Seltzer, MD;  Location: [facility];  Service: General;  Laterality: N/A;  . GASTROJEJUNOSTOMY N/A 05/11/2016   Procedure: LAPAROSCOPIC PLACEMENT  OF FEEDING  JEJUNOSTOMY TUBE;  Surgeon: Excell Seltzer, MD;  Location: [facility];  Service: General;  Laterality: N/A;  . HEMORRHOIDECTOMY WITH HEMORRHOID BANDING    . IR FLUORO GUIDE CV LINE RIGHT  04/06/2017  . IR GENERIC HISTORICAL  05/19/2016   IR CM INJ ANY COLONIC TUBE W/FLUORO 05/19/2016 Aletta Edouard, MD [facility]  . IR US GUIDE VASC ACCESS RIGHT  04/06/2017  . j tube removed march 2018    .  KNEE ARTHROSCOPY Left 09/06/2014  . LAPAROSCOPIC GASTRIC SLEEVE RESECTION N/A 01/14/2016   Procedure: LAPAROSCOPIC GASTRIC SLEEVE RESECTION;  Surgeon: Excell Seltzer, MD;  Location: [facility];  Service: General;  Laterality: N/A;  . LAPAROSCOPIC TUBAL LIGATION  10/16/2011   Procedure: LAPAROSCOPIC TUBAL LIGATION;  Surgeon: Alwyn Pea, MD;  Location: [facility];  Service: Gynecology;  Laterality: Bilateral;  . NOVASURE ABLATION  09/28/2010   mild persistent vaginal bleeding  . right knee arthroscopy     05/07/2016  . TUBAL LIGATION    . VAGINAL HYSTERECTOMY Bilateral 06/08/2017   Procedure: HYSTERECTOMY VAGINAL W/ BILATERAL SALPINGECTOMY;  Surgeon: [name], MD;  Location: [facility];  Service: Gynecology;  Laterality: Bilateral;  . wisdom teeth extracted      OB History    Gravida Para  Term Preterm AB Living   4 4 4     3    SAB TAB Ectopic Multiple Live Births                   Home Medications    Prior to Admission medications   Medication Sig Start Date End Date Taking? Authorizing Provider  acetaminophen (TYLENOL) 500 MG tablet Take 500 mg by mouth every 6 (six) hours as needed (for pain).    Yes [provider]  acetaminophen (TYLENOL) 650 MG CR tablet Take 650-1,300 mg by mouth every 6 (six) hours as needed (for pain).    Yes [provider]  albuterol (PROVENTIL HFA;VENTOLIN HFA) 108 (90 Base) MCG/ACT inhaler Inhale 2 puffs into the lungs every 6 (six) hours as needed for wheezing or shortness of breath. For shortness of breath Patient taking differently: Inhale 2 puffs into the lungs every 6 (six) hours as needed for wheezing or shortness of breath.  09/22/16  Yes Wardell Honour, MD  CALCIUM CITRATE PO Take 10 mLs by mouth daily.   Yes [provider]  cyanocobalamin (,VITAMIN B-12,) 1000 MCG/ML injection Inject 1,000 mcg into the muscle every 30 (thirty) days.   Yes [provider]  diclofenac sodium (VOLTAREN) 1 % GEL Apply 2 g topically 3  (three) times daily as needed (for knee pain). AND MAY USE UP TO AN ADDITIONAL 3 TIMES A DAY IF NEEDED   Yes [provider]  EPINEPHrine (EPIPEN 2-PAK) 0.3 mg/0.3 mL IJ SOAJ injection Inject 0.3 mg into the muscle once as needed (for severe allergic reaction).   Yes [provider]  famotidine (PEPCID) 40 MG tablet Take 40 mg by mouth 2 (two) times daily as needed (for heartburn/indigestion.).    Yes [provider]  fluticasone (FLONASE) 50 MCG/ACT nasal spray Place 1 spray into both nostrils daily as needed for allergies or rhinitis. Reported on 04/01/2016   Yes [provider]  hydrocortisone (ANUSOL-HC) 25 MG suppository Place 1 suppository (25 mg total) rectally 2 (two) times daily. Patient taking differently: Place 25 mg rectally 2 (two) times daily. FOR 6 DAYS 06/10/17  Yes [name], MD  HYDROmorphone (DILAUDID) 2 MG tablet Take 1 tablet (2 mg total) by mouth every 3 (three) hours as needed for moderate pain or severe pain. 06/09/17  Yes [name], MD  linaclotide [facility]) 290 MCG CAPS capsule Take 290 mcg by mouth daily before breakfast.   Yes [provider]  Multiple Vitamins-Minerals (MULTIVITAMIN ADULT) CHEW Chew 1 tablet by mouth 2 (two) times daily.   Yes [provider]  ondansetron (ZOFRAN-ODT) 8 MG disintegrating tablet Take 1 tablet (8 mg total) by mouth every 8 (eight) hours as needed for nausea. 03/25/16  Yes Wardell Honour, MD  pantoprazole (PROTONIX) 40 MG tablet Take 40 mg by mouth 2 (two) times daily.    Yes [provider]  promethazine (PHENERGAN) 25 MG tablet Take 1 tablet (25 mg total) by mouth every 6 (six) hours as needed for nausea or vomiting. 01/31/17  Yes Starr Lake, CNM  sucralfate (CARAFATE) 1 g tablet Take 1 tablet (1 g total) by mouth 4 (four) times daily. 01/30/17  Yes Lacretia Leigh, MD  thiamine (VITAMIN B-1) 100 MG tablet Take 100 mg by mouth daily.    Yes [provider]  thiamine 518 mg, folic acid 1 mg, multivitamins adult 10 mL in sodium chloride 0.9 % 1,000 mL Inject 1,000 mLs  into the vein every Wednesday. [facility] [provider]  metroNIDAZOLE (FLAGYL) 500 MG tablet Take 1 tablet (500 mg total) by mouth 2 (two) times daily with a meal. DO NOT CONSUME ALCOHOL WHILE TAKING THIS MEDICATION. Patient not taking: Reported on 06/14/2017 05/20/17   Harrison Mons, PA-C  nitrofurantoin, macrocrystal-monohydrate, (MACROBID) 100 MG capsule Take 1 capsule (100 mg total) by mouth 2 (two) times daily. 06/14/17   Estanislao Harmon A, PA-C    Family History Family History  Problem Relation Age of Onset  . Hypertension Mother   . Diabetes Mother   . Sarcoidosis Mother        lungs and skin  . Asthma Mother   . Hypertension Father   . Diabetes Father   . Asthma Son   . Tics Son   . Asthma Sister   . Cancer Sister        possible pancreatic cancer  . Adrenal disorder Sister        Tumor   . Asthma Brother   . Asthma Daughter        died age 76.5  . Cancer Daughter 4       brain; died age 76.5  . Asthma Son   . Cancer Paternal Aunt        brain, colon, lung and esophagus; unsure of primary     Social History Social History  Substance Use Topics  . Smoking status: Former Smoker    Packs/day: 0.50    Years: 20.00    Types: Cigarettes    Quit date: 03/17/2014  . Smokeless tobacco: Never Used  . Alcohol use No     Allergies   Coconut oil; Morphine and related; Oxycodone; Ciprofloxacin; Doxycycline; Adhesive [tape]; and Penicillins   Review of Systems Review of Systems  Constitutional: Negative for activity change, chills and fever.  Respiratory: Positive for shortness of breath.   Cardiovascular: Positive for chest pain.  Gastrointestinal: Positive for abdominal pain and nausea. Negative for diarrhea and vomiting.  Genitourinary: Negative for dysuria, flank pain and urgency.  Musculoskeletal: Negative for back  pain.  Skin: Negative for rash.  Neurological: Positive for dizziness and light-headedness.   Physical Exam Updated Vital Signs BP (!) 115/91 (BP Location: Left Arm)   Pulse 63   Temp 98.1 F (36.7 C) (Oral)   Resp 18   Ht 5' 6"  (1.676 m)   Wt 76.7 kg (169 lb)   LMP 06/02/2017 Comment: hysterectomy 06/08/2017  SpO2 100%   BMI 27.28 kg/m   Physical Exam  Constitutional: No distress.  HENT:  Head: Normocephalic.  Eyes: Conjunctivae are normal.  Neck: Neck supple.  Cardiovascular: Normal rate and regular rhythm.  Exam reveals no gallop and no friction rub.   No murmur heard. Pulmonary/Chest: Effort normal. No respiratory distress. She has no wheezes. She has no rales.  Lungs are clear to auscultation bilaterally.  Abdominal: Soft. She exhibits no distension.  Severe tenderness to palpation over the bilateral lower quadrants of the abdomen. No rebound or guarding.  Musculoskeletal:  Tender to palpation over the paraspinal muscles of the lumbar spine. No tenderness to palpation of the lumbar spinous processes.  Neurological: She is alert.  Cranial nerves 2-12 intact. Finger-to-nose is normal. 5/5 motor strength of the bilateral upper and lower extremities. Moves all four extremities. Negative Romberg. Ambulatory without difficulty. NVI.    Skin: Skin is warm. Capillary refill takes less than 2 seconds. No rash noted.  Psychiatric:  Her behavior is normal.  Nursing note and vitals reviewed.    ED Treatments / Results  Labs (all labs ordered are listed, but only abnormal results are displayed) Labs Reviewed  URINE CULTURE - Abnormal; Notable for the following:       Result Value   Culture >=100,000 COLONIES/mL ESCHERICHIA COLI (*)    All other components within normal limits  BASIC METABOLIC PANEL - Abnormal; Notable for the following:    CO2 20 (*)    Calcium 8.7 (*)    All other components within normal limits  CBC - Abnormal; Notable for the following:    RBC 3.50 (*)     Hemoglobin 8.7 (*)    HCT 28.4 (*)    MCH 24.9 (*)    RDW 17.0 (*)    All other components within normal limits  URINALYSIS, ROUTINE W REFLEX MICROSCOPIC - Abnormal; Notable for the following:    APPearance HAZY (*)    Specific Gravity, Urine >1.046 (*)    Hgb urine dipstick SMALL (*)    Protein, ur 30 (*)    Nitrite POSITIVE (*)    Leukocytes, UA LARGE (*)    Bacteria, UA MANY (*)    Squamous Epithelial / LPF 0-5 (*)    Non Squamous Epithelial 0-5 (*)    All other components within normal limits  I-STAT TROPONIN, ED  I-STAT CG4 LACTIC ACID, ED    EKG  EKG Interpretation  Date/Time:  Monday June 14 2017 13:54:30 EDT Ventricular Rate:  90 PR Interval:  150 QRS Duration: 66 QT Interval:  360 QTC Calculation: 440 R Axis:   39 Text Interpretation:  Normal sinus rhythm Normal ECG No significant change since last tracing Confirmed by Theotis Burrow [phone]) on 06/14/2017 5:24:39 PM       Radiology No results found.  Procedures Procedures (including critical care time)  Medications Ordered in ED Medications  ondansetron (ZOFRAN) injection 4 mg (4 mg Intravenous Given 06/14/17 1834)  sodium chloride 0.9 % bolus 1,000 mL (0 mLs Intravenous Stopped 06/14/17 1956)  HYDROmorphone (DILAUDID) injection 1 mg (1 mg Intravenous Given 06/14/17 1834)  iopamidol (ISOVUE-370) 76 % injection (100 mLs  Contrast Given 06/14/17 1920)     Initial Impression / Assessment and Plan / ED Course  I have reviewed the triage vital signs and the nursing notes.  Pertinent labs & imaging results that were available during my care of the patient were reviewed by me and considered in my medical decision making (see chart for details).     41 year old female who is 6-days post-op after having a hysterectomy presenting with lightheadedness x2 days with associated dizziness, chest discomfort and dyspnea. The patient was discussed with Dr. Sherry Ruffing, attending physician. She does not complain of  abdominal pain, but is very TTP to her bilateral low pain and bilateral lower abdomen. Troponin negative. EKG with NSR. Doubt NSTEMI or MI.   No electrolyte abnormalities. No leukocytosis. Hemoglobin is 8.7 improved from 7.3 on 9/12. UA consistent with UTI. Will send for culture and start the patient on nitrofurantoin. Given patient's recent surgery and no chest pain, CTA ordered, which is negative for pulmonary embolism or aortic dissection. CT head unremarkable. A fluid collection is noted within the central pelvis and concerning for hematoma more likely than abscess. Given the patient's labs and vitals, the fluid seems more consistent with a hematoma. Consulted OB/GYN and spoke with the patient's surgeon Dr. Kennon Rounds who recommended following up with her in clinic in 2  weeks and return to the ED if her symptoms worsened. IV fluids given. Zofran given for nausea. Vital signs stable. No acute distress. The patient is safe for discharge at this time.  Final Clinical Impressions(s) / ED Diagnoses   Final diagnoses:  Complication of procedure, initial encounter  Acute cystitis without hematuria    New Prescriptions Discharge Medication List as of 06/14/2017  9:30 PM    START taking these medications   Details  nitrofurantoin, macrocrystal-monohydrate, (MACROBID) 100 MG capsule Take 1 capsule (100 mg total) by mouth 2 (two) times daily., Starting Mon 06/14/2017, Print         Darryon Bastin A, PA-C 06/17/17 2119    Tegeler, Gwenyth Allegra, MD 06/18/17 [phone]

## 2017-06-14 NOTE — ED Notes (Signed)
Pt feels like her throat is swelling and states she's having a hard time swallowing. Brought pt back to triage for reevaluation.

## 2017-06-14 NOTE — ED Notes (Signed)
Patient transported to CT 

## 2017-06-15 ENCOUNTER — Ambulatory Visit (HOSPITAL_COMMUNITY)
Admission: RE | Admit: 2017-06-15 | Discharge: 2017-06-15 | Disposition: A | Discharge status: Home / Self Care | Payer: 59 | Source: Ambulatory Visit | Attending: General Surgery | Admitting: General Surgery

## 2017-06-15 DIAGNOSIS — Z4800 Encounter for change or removal of nonsurgical wound dressing: Secondary | ICD-10-CM | POA: Diagnosis not present

## 2017-06-15 DIAGNOSIS — E860 Dehydration: Secondary | ICD-10-CM

## 2017-06-15 MED ORDER — THIAMINE HCL 100 MG/ML IJ SOLN
Freq: Once | INTRAVENOUS | Status: AC
  Administered 2017-06-15: 12:00:00 via INTRAVENOUS
  Filled 2017-06-15: qty 1000

## 2017-06-15 MED ORDER — ONDANSETRON 4 MG PO TBDP: 4.0000 mg | ORAL_TABLET | Freq: Once | ORAL | Status: DC

## 2017-06-15 MED ORDER — HEPARIN SOD (PORK) LOCK FLUSH 100 UNIT/ML IV SOLN
250.0000 [IU] | INTRAVENOUS | Status: AC | PRN
  Administered 2017-06-15: 250 [IU]
  Filled 2017-06-15: qty 5

## 2017-06-15 MED ORDER — SODIUM CHLORIDE 0.9% FLUSH
10.0000 mL | INTRAVENOUS | Status: AC | PRN
  Administered 2017-06-15: 10 mL

## 2017-06-15 MED FILL — NITROFURANTOIN MONO-MCR 100: 100 | 5 days supply | Qty: 10 | Fill #0

## 2017-06-15 NOTE — Progress Notes (Signed)
Diagnosis: Dehydration   Ordering Provider: Excell Seltzer, MD   Procedure: IV infusion of NS with thiamine, folic acid, and multivitamin additives; PICC line dressing change per protocol    Pt tolerated infusion well. She was alert, oriented and ambulatory at discharge. D/C instructions given with verbal understanding.

## 2017-06-15 NOTE — Discharge Instructions (Signed)
Pt received 1 banana bag today via PICC line. Patient's dressing also changed per protocol.

## 2017-06-18 LAB — URINE CULTURE: Culture: >=100000 — AB

## 2017-06-22 ENCOUNTER — Ambulatory Visit (INDEPENDENT_AMBULATORY_CARE_PROVIDER_SITE_OTHER): Payer: 59 | Admitting: Physician Assistant

## 2017-06-22 ENCOUNTER — Encounter: Payer: Self-pay | Admitting: Physician Assistant

## 2017-06-22 ENCOUNTER — Ambulatory Visit (HOSPITAL_COMMUNITY)
Admission: RE | Admit: 2017-06-22 | Discharge: 2017-06-22 | Disposition: A | Discharge status: Home / Self Care | Payer: 59 | Source: Ambulatory Visit | Attending: Family Medicine | Admitting: Family Medicine

## 2017-06-22 VITALS — BP 110/74 | HR 66 | Temp 98.2°F | Resp 18 | Ht 66.0 in | Wt 168.4 lb

## 2017-06-22 DIAGNOSIS — Z4800 Encounter for change or removal of nonsurgical wound dressing: Secondary | ICD-10-CM | POA: Diagnosis not present

## 2017-06-22 DIAGNOSIS — E538 Deficiency of other specified B group vitamins: Secondary | ICD-10-CM | POA: Diagnosis not present

## 2017-06-22 DIAGNOSIS — R300 Dysuria: Secondary | ICD-10-CM

## 2017-06-22 DIAGNOSIS — M549 Dorsalgia, unspecified: Secondary | ICD-10-CM | POA: Diagnosis not present

## 2017-06-22 LAB — POCT URINALYSIS DIPSTICK
BILIRUBIN UA: NEGATIVE
GLUCOSE UA: NEGATIVE
Ketones, UA: NEGATIVE
Leukocytes, UA: NEGATIVE
Nitrite, UA: NEGATIVE
Protein, UA: NEGATIVE
RBC UA: NEGATIVE
SPEC GRAV UA: 1.025 (ref 1.010–1.025)
UROBILINOGEN UA: 1 U/dL
pH, UA: 6.5 (ref 5.0–8.0)

## 2017-06-22 MED ORDER — SODIUM CHLORIDE 0.9% FLUSH
10.0000 mL | INTRAVENOUS | Status: AC | PRN
  Administered 2017-06-22: 10 mL

## 2017-06-22 MED ORDER — THIAMINE HCL 100 MG/ML IJ SOLN
Freq: Once | INTRAVENOUS | Status: AC
  Administered 2017-06-22: 10:00:00 via INTRAVENOUS
  Filled 2017-06-22: qty 1000

## 2017-06-22 MED ORDER — PHENAZOPYRIDINE HCL 200 MG PO TABS: 200.0000 mg | ORAL_TABLET | Freq: Three times a day (TID) | ORAL | 0 refills | Status: DC | PRN

## 2017-06-22 MED ORDER — HEPARIN SOD (PORK) LOCK FLUSH 100 UNIT/ML IV SOLN
250.0000 [IU] | INTRAVENOUS | Status: AC | PRN
  Administered 2017-06-22: 250 [IU]
  Filled 2017-06-22: qty 5

## 2017-06-22 MED ORDER — ONDANSETRON 4 MG PO TBDP
4.0000 mg | ORAL_TABLET | Freq: Once | ORAL | Status: AC | PRN
  Administered 2017-06-22: 4 mg via ORAL
  Filled 2017-06-22: qty 1

## 2017-06-22 NOTE — Discharge Instructions (Signed)
Pt received a banana today and picc line dressing was changed.

## 2017-06-22 NOTE — Progress Notes (Signed)
Patient ID: [name], female    DOB: 10-01-1975, 41 y.o.   MRN: [id number]  PCP: Wardell Honour, MD  Chief Complaint  Patient presents with  . Back Pain    Pt states she went the ED and they told her she had a UTI and states her back pain is worse and hurts to use the bathroom.    Subjective:   Presents for evaluation of back pain/persistent UTI.  This patient has persistent nausea/vomiting following gastric sleeve. As such, she is chronically dehydrated and receives IV hydration weekly. OTherwise, the only thing she can keep down is [employer] sugar-free decaffeinated vanilla iced coffee.  She is s/p hysterectomy on 06/08/2017, and recalls having difficulty urinating following catheter removal.  She presented to the ED on 9/17 on the urging of her visiting home nurse, and was diagnosed with UTI. She relates becoming dehydrated, associated with diaphoresis and increased lightheadedness. She was diagnosed with acute cystitis, and prescribed nitrofurantoin. Urine culture performed revealed E coli, >100,000 colony forming units, sensitive to nitrofurantoin, gentamycin, imipenem, pip/taz resistant to cipro, SMX-TMP, amox, amox-clav, cefazolin, ceftriaxone.  She has completed the antibiotic, but has continued to have symptoms. She relates urinary urgency, frequency and burning, and the flank pain is 9/10 at its worst. Dilaudid helps, but causes drowsiness. She does note that she has improved since receiving IV hydration earlier today.  Not sexually active since 11/2016.   Review of Systems Constitutional: Positive for chills and fatigue. Negative for diaphoresis and fever.  Gastrointestinal: Positive for abdominal pain.  Endocrine: Positive for cold intolerance.  Genitourinary: Positive for dysuria, flank pain, frequency, pelvic pain and urgency. Negative for hematuria.     Patient Active Problem List   Diagnosis Date Noted  . S/P vaginal hysterectomy 06/08/2017  .  Dehydration 04/02/2017  . Malnutrition, calorie ([place]) 03/27/2017  . Psychophysiological insomnia 03/18/2017  . Nausea and vomiting 12/14/2016  . Paresthesia 07/22/2016  . Chronic headache disorder 07/22/2016  . Memory difficulty 07/22/2016  . Hematochezia 04/04/2016  . 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 Postoperative nausea and vomiting 01/21/2016  . Anemia, iron deficiency 10/01/2015  . Skin lesion 05/18/2015  . Anal fissure - posterior 10/16/2014  . Left sided chronic colitis - segmental 06/11/2014  . Internal and external bleeding hemorrhoids 06/11/2014  . Asthma 04/26/2012  . Excessive or frequent menstruation 10/16/2011  . DEPRESSION, MILD 10/02/2009     Prior to Admission medications   Medication Sig Start Date End Date Taking? Authorizing Provider  acetaminophen (TYLENOL) 500 MG tablet Take 500 mg by mouth every 6 (six) hours as needed (for pain).    Yes [provider]  acetaminophen (TYLENOL) 650 MG CR tablet Take 650-1,300 mg by mouth every 6 (six) hours as needed (for pain).    Yes [provider]  albuterol (PROVENTIL HFA;VENTOLIN HFA) 108 (90 Base) MCG/ACT inhaler Inhale 2 puffs into the lungs every 6 (six) hours as needed for wheezing or shortness of breath. For shortness of breath Patient taking differently: Inhale 2 puffs into the lungs every 6 (six) hours as needed for wheezing or shortness of breath.  09/22/16  Yes Wardell Honour, MD  CALCIUM CITRATE PO Take 10 mLs by mouth daily.   Yes [provider]  cyanocobalamin (,VITAMIN B-12,) 1000 MCG/ML injection Inject 1,000 mcg into the muscle every 30 (thirty) days.   Yes [provider]  diclofenac sodium (VOLTAREN) 1 % GEL Apply 2 g topically 3 (three) times  daily as needed (for knee pain). AND MAY USE UP TO AN ADDITIONAL 3 TIMES A DAY IF NEEDED   Yes [provider]  EPINEPHrine (EPIPEN 2-PAK) 0.3 mg/0.3 mL IJ SOAJ injection Inject 0.3 mg into the muscle once  as needed (for severe allergic reaction).   Yes [provider]  famotidine (PEPCID) 40 MG tablet Take 40 mg by mouth 2 (two) times daily as needed (for heartburn/indigestion.).    Yes [provider]  fluticasone (FLONASE) 50 MCG/ACT nasal spray Place 1 spray into both nostrils daily as needed for allergies or rhinitis. Reported on 04/01/2016   Yes [provider]  hydrocortisone (ANUSOL-HC) 25 MG suppository Place 1 suppository (25 mg total) rectally 2 (two) times daily. Patient taking differently: Place 25 mg rectally 2 (two) times daily. FOR 6 DAYS 06/10/17  Yes [name], MD  HYDROmorphone (DILAUDID) 2 MG tablet Take 1 tablet (2 mg total) by mouth every 3 (three) hours as needed for moderate pain or severe pain. 06/09/17  Yes [name], MD  linaclotide [facility]) 290 MCG CAPS capsule Take 290 mcg by mouth daily before breakfast.   Yes [provider]  Multiple Vitamins-Minerals (MULTIVITAMIN ADULT) CHEW Chew 1 tablet by mouth 2 (two) times daily.   Yes [provider]  ondansetron (ZOFRAN-ODT) 8 MG disintegrating tablet Take 1 tablet (8 mg total) by mouth every 8 (eight) hours as needed for nausea. 03/25/16  Yes Wardell Honour, MD  pantoprazole (PROTONIX) 40 MG tablet Take 40 mg by mouth 2 (two) times daily.    Yes [provider]  promethazine (PHENERGAN) 25 MG tablet Take 1 tablet (25 mg total) by mouth every 6 (six) hours as needed for nausea or vomiting. 01/31/17  Yes Starr Lake, CNM  sucralfate (CARAFATE) 1 g tablet Take 1 tablet (1 g total) by mouth 4 (four) times daily. 01/30/17  Yes Lacretia Leigh, MD  thiamine (VITAMIN B-1) 100 MG tablet Take 100 mg by mouth daily.    Yes [provider]  thiamine 829 mg, folic acid 1 mg, multivitamins adult 10 mL in sodium chloride 0.9 % 1,000 mL Inject 1,000 mLs into the vein every Wednesday. [facility] [provider]     Allergies    Allergen Reactions  . Coconut Oil Anaphylaxis and Itching  . Morphine And Related Anaphylaxis and Hives    Pt states that she has tolerated Norco and Dilaudid  . Oxycodone Anaphylaxis  . Ciprofloxacin Itching and Rash  . Doxycycline Nausea And Vomiting  . Adhesive [Tape] Rash    And paper tape causes a rash if wearing for a prolong period of time  . Penicillins Rash    Has patient had a PCN reaction causing immediate rash, facial/tongue/throat swelling, SOB or lightheadedness with hypotension: Yes Has patient had a PCN reaction causing severe rash involving mucus membranes or skin necrosis: No Has patient had a PCN reaction that required hospitalization No Has patient had a PCN reaction occurring within the last 10 years: No If all of the above answers are "NO", then may proceed with Cephalosporin use.       Objective:  Physical Exam  Constitutional: She is oriented to person, place, and time. She appears well-developed and well-nourished. She is active and cooperative. No distress.  BP 110/74 (BP Location: Right Arm, Patient Position: Sitting, Cuff Size: Normal)   Pulse 66   Temp 98.2 F (36.8 C) (Oral)   Resp  18   Ht 5' 6"  (1.676 m)   Wt 168 lb 6.4 oz (76.4 kg)   LMP 06/02/2017 Comment: hysterectomy 06/08/2017  SpO2 99%   BMI 27.18 kg/m   HENT:  Head: Normocephalic and atraumatic.  Right Ear: Hearing normal.  Left Ear: Hearing normal.  Eyes: Conjunctivae are normal. No scleral icterus.  Neck: Normal range of motion. Neck supple. No thyromegaly present.  Cardiovascular: Normal rate, regular rhythm and normal heart sounds.   Pulses:      Radial pulses are 2+ on the right side, and 2+ on the left side.  Pulmonary/Chest: Effort normal and breath sounds normal.  Abdominal: Soft. Normal appearance and bowel sounds are normal. There is no hepatosplenomegaly. There is tenderness in the suprapubic area. There is CVA tenderness.  Lymphadenopathy:       Head (right side): No  tonsillar, no preauricular, no posterior auricular and no occipital adenopathy present.       Head (left side): No tonsillar, no preauricular, no posterior auricular and no occipital adenopathy present.    She has no cervical adenopathy.       Right: No supraclavicular adenopathy present.       Left: No supraclavicular adenopathy present.  Neurological: She is alert and oriented to person, place, and time. No sensory deficit.  Skin: Skin is warm, dry and intact. No rash noted. No cyanosis or erythema. Nails show no clubbing.  Psychiatric: She has a normal mood and affect. Her speech is normal and behavior is normal.       Results for orders placed or performed in visit on 06/22/17  POCT urinalysis dipstick  Result Value Ref Range   Color, UA yellow    Clarity, UA clear    Glucose, UA negative    Bilirubin, UA negative    Ketones, UA negative    Spec Grav, UA 1.025 1.010 - 1.025   Blood, UA negative    pH, UA 6.5 5.0 - 8.0   Protein, UA negative    Urobilinogen, UA 1.0 0.2 or 1.0 E.U./dL   Nitrite, UA negative    Leukocytes, UA Negative Negative   Discussed with Dr. Tamala Julian.    Assessment & Plan:   1. Acute back pain, unspecified back location, unspecified back pain laterality 2. Dysuria No evidence of persistent infection following antibiotic treatment. Continue efforts for oral hydration pending repeat urine culture. Continue supportive care. - POCT urinalysis dipstick - Urine Culture - phenazopyridine (PYRIDIUM) 200 MG tablet; Take 1 tablet (200 mg total) by mouth 3 (three) times daily as needed for pain.  Dispense: 10 tablet; Refill: 0   Return if symptoms worsen or fail to improve.   Fara Chute, PA-C [facility] [place]

## 2017-06-22 NOTE — Progress Notes (Signed)
Diagnosis: Dehydration   Ordering Provider: Excell Seltzer, MD   Procedure: IV infusion of NS with thiamine, folic acid, and multivitamin additives; PICC line dressing change per protocol    Pt tolerated infusion well. She was alert, oriented and ambulatory at discharge. D/C instructions given with verbal understanding

## 2017-06-22 NOTE — Patient Instructions (Signed)
     IF you received an x-ray today, you will receive an invoice from [facility]. Please contact [facility] at [phone] with questions or concerns regarding your invoice.   IF you received labwork today, you will receive an invoice from [facility]. Please contact [facility] at [phone] with questions or concerns regarding your invoice.   Our billing staff will not be able to assist you with questions regarding bills from these companies.  You will be contacted with the lab results as soon as they are available. The fastest way to get your results is to activate your My Chart account. Instructions are located on the last page of this paperwork. If you have not heard from us regarding the results in 2 weeks, please contact this office.     

## 2017-06-22 NOTE — Progress Notes (Signed)
Subjective:    Patient ID: [name], female    DOB: 11/28/1975, 41 y.o.   MRN: [id number]  HPI Patient presents for evaluation of dysuria. She states she went to the ED on 06/14/2017 and was diagnosed with UTI and prescribed Nitrofurantoin, which she completed the 5 day course of. She notes that since then her symptoms have worsened and persisted. She describes urinary frequency, urgency, dysuria, and bilateral flank pain. She rates the flank pain a 9/10 at its worst. Patient was taking half tablet of Dilaudid, which helped the pain but made her drowsy. She states that she got her weekly fluid infusion today and it helped with her symptoms. She states that she stays hydrated with sugar free vanilla ice coffee, as she is unable to keep down any other types of liquids, including water. Patient had a hysterectomy two weeks ago.   She is sexually active, the last encounter she was 12/14/2016.   Review of Systems  Constitutional: Positive for chills and fatigue. Negative for diaphoresis and fever.  Gastrointestinal: Positive for abdominal pain.  Endocrine: Positive for cold intolerance.  Genitourinary: Positive for dysuria, flank pain, frequency, pelvic pain and urgency. Negative for hematuria.   Prior to Admission medications   Medication Sig Start Date End Date Taking? Authorizing Provider  acetaminophen (TYLENOL) 500 MG tablet Take 500 mg by mouth every 6 (six) hours as needed (for pain).    Yes [provider]  acetaminophen (TYLENOL) 650 MG CR tablet Take 650-1,300 mg by mouth every 6 (six) hours as needed (for pain).    Yes [provider]  albuterol (PROVENTIL HFA;VENTOLIN HFA) 108 (90 Base) MCG/ACT inhaler Inhale 2 puffs into the lungs every 6 (six) hours as needed for wheezing or shortness of breath. For shortness of breath Patient taking differently: Inhale 2 puffs into the lungs every 6 (six) hours as needed for wheezing or shortness of breath.  09/22/16  Yes  Wardell Honour, MD  CALCIUM CITRATE PO Take 10 mLs by mouth daily.   Yes [provider]  cyanocobalamin (,VITAMIN B-12,) 1000 MCG/ML injection Inject 1,000 mcg into the muscle every 30 (thirty) days.   Yes [provider]  diclofenac sodium (VOLTAREN) 1 % GEL Apply 2 g topically 3 (three) times daily as needed (for knee pain). AND MAY USE UP TO AN ADDITIONAL 3 TIMES A DAY IF NEEDED   Yes [provider]  EPINEPHrine (EPIPEN 2-PAK) 0.3 mg/0.3 mL IJ SOAJ injection Inject 0.3 mg into the muscle once as needed (for severe allergic reaction).   Yes [provider]  famotidine (PEPCID) 40 MG tablet Take 40 mg by mouth 2 (two) times daily as needed (for heartburn/indigestion.).    Yes [provider]  fluticasone (FLONASE) 50 MCG/ACT nasal spray Place 1 spray into both nostrils daily as needed for allergies or rhinitis. Reported on 04/01/2016   Yes [provider]  hydrocortisone (ANUSOL-HC) 25 MG suppository Place 1 suppository (25 mg total) rectally 2 (two) times daily. Patient taking differently: Place 25 mg rectally 2 (two) times daily. FOR 6 DAYS 06/10/17  Yes [name], MD  HYDROmorphone (DILAUDID) 2 MG tablet Take 1 tablet (2 mg total) by mouth every 3 (three) hours as needed for moderate pain or severe pain. 06/09/17  Yes [name], MD  linaclotide [facility]) 290 MCG CAPS capsule Take 290 mcg by mouth daily before breakfast.   Yes [provider]  metroNIDAZOLE (FLAGYL) 500 MG tablet Take 1  tablet (500 mg total) by mouth 2 (two) times daily with a meal. DO NOT CONSUME ALCOHOL WHILE TAKING THIS MEDICATION. 05/20/17  Yes Jeffery, Chelle, PA-C  Multiple Vitamins-Minerals (MULTIVITAMIN ADULT) CHEW Chew 1 tablet by mouth 2 (two) times daily.   Yes [provider]  ondansetron (ZOFRAN-ODT) 8 MG disintegrating tablet Take 1 tablet (8 mg total) by mouth every 8 (eight) hours as needed for nausea. 03/25/16  Yes Wardell Honour, MD    pantoprazole (PROTONIX) 40 MG tablet Take 40 mg by mouth 2 (two) times daily.    Yes [provider]  promethazine (PHENERGAN) 25 MG tablet Take 1 tablet (25 mg total) by mouth every 6 (six) hours as needed for nausea or vomiting. 01/31/17  Yes Starr Lake, CNM  sucralfate (CARAFATE) 1 g tablet Take 1 tablet (1 g total) by mouth 4 (four) times daily. 01/30/17  Yes Lacretia Leigh, MD  thiamine (VITAMIN B-1) 100 MG tablet Take 100 mg by mouth daily.    Yes [provider]  thiamine 478 mg, folic acid 1 mg, multivitamins adult 10 mL in sodium chloride 0.9 % 1,000 mL Inject 1,000 mLs into the vein every Wednesday. [place] [facility] [provider]  nitrofurantoin, macrocrystal-monohydrate, (MACROBID) 100 MG capsule Take 1 capsule (100 mg total) by mouth 2 (two) times daily. Patient not taking: Reported on 06/22/2017 06/14/17   McDonald, Mia A, PA-C   Allergies  Allergen Reactions  . Coconut Oil Anaphylaxis and Itching  . Morphine And Related Anaphylaxis and Hives    Pt states that she has tolerated Norco and Dilaudid  . Oxycodone Anaphylaxis  . Ciprofloxacin Itching and Rash  . Doxycycline Nausea And Vomiting  . Adhesive [Tape] Rash    And paper tape causes a rash if wearing for a prolong period of time  . Penicillins Rash    Has patient had a PCN reaction causing immediate rash, facial/tongue/throat swelling, SOB or lightheadedness with hypotension: Yes Has patient had a PCN reaction causing severe rash involving mucus membranes or skin necrosis: No Has patient had a PCN reaction that required hospitalization No Has patient had a PCN reaction occurring within the last 10 years: No If all of the above answers are "NO", then may proceed with Cephalosporin use.   Social History   Social History  . Marital status: Single    Spouse name: N/A  . Number of children: 3  . Years of education: Some college   Occupational History  . [employer]  and [employer]     Currently out on disability d/t surg   Social History Main Topics  . Smoking status: Former Smoker    Packs/day: 0.50    Years: 20.00    Types: Cigarettes    Quit date: 03/17/2014  . Smokeless tobacco: Never Used  . Alcohol use No  . Drug use: No  . Sexual activity: Not Currently    Birth control/ protection: Surgical   Other Topics Concern  . Not on file   Social History Narrative   Marital status:  Single; not dating seriously.      Children:  3 sons (18, 56, 28) whose father is deceased, 1 daughter (deceased); no grandchildren      Lives:  With 2 sons      Employment:  Full time; Corporate treasurer at [employer]      Tobacco: quit June 2015      Alcohol:  None  Drugs:  None      Exercise:  Leg lifts for knee strengthening; walking   1 caffeine beverage daily      Sexual activity:  Active; total partners = up there.  No STDs in past.        Right-handed      Patient Active Problem List   Diagnosis Date Noted  . S/P vaginal hysterectomy 06/08/2017  . Dehydration 04/02/2017  . Malnutrition, calorie ([place]) 03/27/2017  . Psychophysiological insomnia 03/18/2017  . Nausea and vomiting 12/14/2016  . Paresthesia 07/22/2016  . Chronic headache disorder 07/22/2016  . Memory difficulty 07/22/2016  . Hematochezia 04/04/2016  . 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 Postoperative nausea and vomiting 01/21/2016  . Anemia, iron deficiency 10/01/2015  . Skin lesion 05/18/2015  . Anal fissure - posterior 10/16/2014  . Left sided chronic colitis - segmental 06/11/2014  . Internal and external bleeding hemorrhoids 06/11/2014  . Asthma 04/26/2012  . Excessive or frequent menstruation 10/16/2011  . DEPRESSION, MILD 10/02/2009      Objective:   Physical Exam  Constitutional: She is oriented to person, place, and time. She appears well-developed. She appears distressed.  HENT:  Head: Normocephalic and atraumatic.  Right Ear: External ear normal.    Left Ear: External ear normal.  Eyes: Pupils are equal, round, and reactive to light.  Cardiovascular: Normal rate, regular rhythm, normal heart sounds and intact distal pulses.   Pulmonary/Chest: Effort normal and breath sounds normal. No respiratory distress. She has no wheezes. She has no rales. She exhibits no tenderness.  Abdominal: Soft. Bowel sounds are normal. She exhibits no distension and no mass. There is tenderness in the suprapubic area. There is CVA tenderness. There is no rebound and no guarding.  Negative heel jar bilaterally  Neurological: She is alert and oriented to person, place, and time.  Skin: Skin is dry. She is not diaphoretic.      Assessment & Plan:  1. Acute back pain, unspecified back location, bilateral - POCT urinalysis dipstick - Urine Culture  2. Dysuria - Suspect bladder spasm vs UTI as urine dipstick was negative.  - phenazopyridine (PYRIDIUM) 200 MG tablet; Take 1 tablet (200 mg total) by mouth 3 (three) times daily as needed for pain.  Dispense: 10 tablet; Refill: 0  Respectfully, Denny Levy PA-S 2019

## 2017-06-23 ENCOUNTER — Ambulatory Visit: Payer: 59 | Admitting: Family Medicine

## 2017-06-23 ENCOUNTER — Encounter: Payer: Self-pay | Admitting: Family Medicine

## 2017-06-23 ENCOUNTER — Telehealth: Payer: Self-pay | Admitting: Skilled Nursing Facility1

## 2017-06-23 VITALS — BP 122/77 | HR 71 | Ht 67.0 in | Wt 167.0 lb

## 2017-06-23 DIAGNOSIS — N3000 Acute cystitis without hematuria: Secondary | ICD-10-CM

## 2017-06-23 DIAGNOSIS — E860 Dehydration: Secondary | ICD-10-CM

## 2017-06-23 DIAGNOSIS — Z90710 Acquired absence of both cervix and uterus: Secondary | ICD-10-CM

## 2017-06-23 LAB — URINE CULTURE

## 2017-06-23 MED ORDER — FOSFOMYCIN TROMETHAMINE 3 G PO PACK
3.0000 g | PACK | Freq: Once | ORAL | 1 refills | Status: AC
Start: 2017-06-23 — End: 2017-06-23

## 2017-06-23 NOTE — Assessment & Plan Note (Signed)
Healing well--rtn in 4 wks--pelvic rest until then, including no sit down baths.

## 2017-06-23 NOTE — Telephone Encounter (Signed)
Pt states she is on her way to the surgeons office for an appointment.  Pt was admitted back into the ED after a hysterectomy do to dehydration complicating a UTI. Pt states she has had severe back pain and is now taking pain medicine and antibacteria to clear the UTI.   Pt was given the time, date, place of the next support group.

## 2017-06-23 NOTE — Progress Notes (Signed)
   Subjective:    Patient ID: [name] is a 41 y.o. female presenting with Follow-up  on 06/23/2017  HPI: Here today for postop check. She continues to have issues with dehydration and need for IVF weekly. Has been seen in ED for this and got more IVF--had a CT which showed small hematoma. She was seen for a UTI and placed on Macrobid. Her E.coli is multi-drug resistant. Placed on Pyridium recently and that has improved symptoms, but her UTI symptoms persist despite 8 days of Macrobid. She denies vaginal bleeding.  Review of Systems  Constitutional: Negative for chills and fever.  Respiratory: Negative for shortness of breath.   Cardiovascular: Negative for chest pain.  Gastrointestinal: Negative for abdominal pain, nausea and vomiting.  Genitourinary: Positive for dysuria.  Skin: Negative for rash.  Neurological: Positive for dizziness.      Objective:    BP 122/77   Pulse 71   Ht 5' 7"  (1.702 m)   Wt 167 lb (75.8 kg)   LMP 06/02/2017 Comment: hysterectomy 06/08/2017  BMI 26.16 kg/m  Physical Exam  Constitutional: She is oriented to person, place, and time. She appears well-developed and well-nourished. No distress.  HENT:  Head: Normocephalic and atraumatic.  Eyes: No scleral icterus.  Neck: Neck supple.  Cardiovascular: Normal rate.   Pulmonary/Chest: Effort normal.  Abdominal: Soft.  Genitourinary:  Genitourinary Comments: Vagina is pink and healing well. Sutures are in place.  Neurological: She is alert and oriented to person, place, and time.  Skin: Skin is warm and dry.  Psychiatric: She has a normal mood and affect.        Assessment & Plan:   Problem List Items Addressed This Visit      Unprioritized   Dehydration    Continue per gen surgery-      S/P vaginal hysterectomy    Healing well--rtn in 4 wks--pelvic rest until then, including no sit down baths.       Other Visit Diagnoses    Acute cystitis without hematuria    -  Primary   treat  with Fosfomycin   Relevant Medications   fosfomycin (MONUROL) 3 g PACK      Total face-to-face time with patient: 15 minutes. Over 50% of encounter was spent on counseling and coordination of care. Return in about 4 weeks (around 07/21/2017) for postop check.  [name] 06/23/2017 12:00 PM

## 2017-06-23 NOTE — Progress Notes (Signed)
Elevated PHQ9, patient states that her depression is being monitored by her PCP.

## 2017-06-23 NOTE — Assessment & Plan Note (Signed)
Continue per gen surgery-

## 2017-06-23 NOTE — Progress Notes (Deleted)
Subjective:    Patient ID: [name], female    DOB: 06/02/1976, 41 y.o.   MRN: [id number]  06/23/2017  No chief complaint on file.   HPI This 41 y.o. female presents for evaluation of ***. BP Readings from Last 3 Encounters:  06/23/17 122/77  06/22/17 110/74  06/14/17 (!) 115/91   Wt Readings from Last 3 Encounters:  06/23/17 167 lb (75.8 kg)  06/22/17 168 lb 6.4 oz (76.4 kg)  06/14/17 169 lb (76.7 kg)   Immunization History  Administered Date(s) Administered  . Influenza,inj,Quad PF,6+ Mos 09/02/2015, 07/21/2016, 05/19/2017  . Influenza-Unspecified 07/30/2014  . Tdap 05/18/2015    Review of Systems  Constitutional: Negative for chills, diaphoresis, fatigue and fever.  Eyes: Negative for visual disturbance.  Respiratory: Negative for cough and shortness of breath.   Cardiovascular: Negative for chest pain, palpitations and leg swelling.  Gastrointestinal: Negative for abdominal pain, constipation, diarrhea, nausea and vomiting.  Endocrine: Negative for cold intolerance, heat intolerance, polydipsia, polyphagia and polyuria.  Neurological: Negative for dizziness, tremors, seizures, syncope, facial asymmetry, speech difficulty, weakness, light-headedness, numbness and headaches.    Past Medical History:  Diagnosis Date  . Anal fissure - posterior 10/16/2014  . Anxiety    doesn't take anything  . Asthma    has Albuterol inhaler as needed  . Boil    on pubic area started septra 03-01-17 draining blood and pus  . Bronchitis   . Chronic headache disorder 07/22/2016  . Clostridium difficile infection 04/20/2012  . Colitis   . Dehydration   . Depression    doesn't take any meds  . Family history of adverse reaction to anesthesia    pt mom gets sick  . GERD (gastroesophageal reflux disease)    takes Pantoprazole daily  . Headache   . Hiatal hernia    neuropathy - mild in arms and legs  . History of blood transfusion    last transfusion was 04/04/2016=Benadryl  was given d/t itching. States she always itches with transfusion.   Marland Kitchen History of bronchitis    > 2 yrs ago  . History of colon polyps    benign  . History of migraine    last one 05/01/16  . History of stomach ulcers   . History of urinary tract infection   . IDA (iron deficiency anemia)   . Internal and external bleeding hemorrhoids 06/11/2014  . Joint pain   . Joint swelling   . Left sided chronic colitis - segmental 06/11/2014  . Migraine   . Motion sickness   . Nausea    takes Zofran as needed  . Nausea and vomiting    for 1 year  . Obesity   . Oligouria   . Osteoarthritis    lower back, knees, wrists - no meds  . Pneumonia 1997  . Postoperative nausea and vomiting 01/21/2016   wants scopolamine patch  . SVD (spontaneous vaginal delivery)    x 4  . TOBACCO USER 10/02/2009   Qualifier: Diagnosis of  By: Dimas Millin MD, Ellard Artis    . Transfusion history    last transfusion 6'16   . UC (ulcerative colitis) ([place])    supposed to be taking Lialda and Bentyl but has been off since gastric sleeve  . Ulcerative colitis ([place])   . Vertigo    doesn't take any meds   Past Surgical History:  Procedure Laterality Date  . ABDOMINAL HYSTERECTOMY    . COLONOSCOPY  2007   for rectal bleeding;  [facility]  . COLONOSCOPY N/A 06/11/2014   Procedure: COLONOSCOPY;  Surgeon: Gatha Mayer, MD;  Location: [facility];  Service: Endoscopy;  Laterality: N/A;  . COLONOSCOPY WITH PROPOFOL N/A 03/02/2017   Procedure: COLONOSCOPY WITH PROPOFOL;  Surgeon: Alphonsa Overall, MD;  Location: [facility];  Service: General;  Laterality: N/A;  . DILATION AND CURETTAGE OF UTERUS    . ESOPHAGOGASTRODUODENOSCOPY (EGD) WITH PROPOFOL N/A 04/06/2016   Procedure: ESOPHAGOGASTRODUODENOSCOPY (EGD) WITH PROPOFOL;  Surgeon: Alphonsa Overall, MD;  Location: [facility];  Service: General;  Laterality: N/A;  . ESOPHAGOGASTRODUODENOSCOPY (EGD) WITH PROPOFOL N/A 03/02/2017   Procedure: ESOPHAGOGASTRODUODENOSCOPY (EGD) WITH PROPOFOL;   Surgeon: Alphonsa Overall, MD;  Location: [facility];  Service: General;  Laterality: N/A;  . ESOPHAGOGASTRODUODENOSCOPY (EGD) WITH PROPOFOL N/A 05/20/2017   Procedure: ESOPHAGOGASTRODUODENOSCOPY (EGD) WITH PROPOFOL ERAS PATHWAY;  Surgeon: Alphonsa Overall, MD;  Location: [facility];  Service: General;  Laterality: N/A;  . EXCISION OF SKIN TAG  06/08/2017   Procedure: EXCISION OF VULVAR SKIN TAGS X2;  Surgeon: [name], MD;  Location: [facility];  Service: Gynecology;;  . FOOT SURGERY Bilateral    x 2  . GASTRIC BYPASS    . GASTRIC ROUX-EN-Y N/A 12/14/2016   Procedure: LAPAROSCOPIC REVISION SLEEVE GASTRECTOMY TO  ROUX-Y-GASTRIC BY-PASS, UPPER ENDO;  Surgeon: Excell Seltzer, MD;  Location: [facility];  Service: General;  Laterality: N/A;  . GASTROJEJUNOSTOMY N/A 05/11/2016   Procedure: LAPAROSCOPIC PLACEMENT  OF FEEDING  JEJUNOSTOMY TUBE;  Surgeon: Excell Seltzer, MD;  Location: [facility];  Service: General;  Laterality: N/A;  . HEMORRHOIDECTOMY WITH HEMORRHOID BANDING    . IR FLUORO GUIDE CV LINE RIGHT  04/06/2017  . IR GENERIC HISTORICAL  05/19/2016   IR CM INJ ANY COLONIC TUBE W/FLUORO 05/19/2016 Aletta Edouard, MD [facility]  . IR US GUIDE VASC ACCESS RIGHT  04/06/2017  . j tube removed march 2018    . KNEE ARTHROSCOPY Left 09/06/2014  . LAPAROSCOPIC GASTRIC SLEEVE RESECTION N/A 01/14/2016   Procedure: LAPAROSCOPIC GASTRIC SLEEVE RESECTION;  Surgeon: Excell Seltzer, MD;  Location: [facility];  Service: General;  Laterality: N/A;  . LAPAROSCOPIC TUBAL LIGATION  10/16/2011   Procedure: LAPAROSCOPIC TUBAL LIGATION;  Surgeon: Alwyn Pea, MD;  Location: [facility];  Service: Gynecology;  Laterality: Bilateral;  . NOVASURE ABLATION  09/28/2010   mild persistent vaginal bleeding  . right knee arthroscopy     05/07/2016  . TUBAL LIGATION    . VAGINAL HYSTERECTOMY Bilateral 06/08/2017   Procedure: HYSTERECTOMY VAGINAL W/ BILATERAL SALPINGECTOMY;  Surgeon: [name], MD;  Location: [facility];  Service:  Gynecology;  Laterality: Bilateral;  . wisdom teeth extracted     Allergies  Allergen Reactions  . Coconut Oil Anaphylaxis and Itching  . Morphine And Related Anaphylaxis and Hives    Pt states that she has tolerated Norco and Dilaudid  . Oxycodone Anaphylaxis  . Ciprofloxacin Itching and Rash  . Doxycycline Nausea And Vomiting  . Adhesive [Tape] Rash    And paper tape causes a rash if wearing for a prolong period of time  . Penicillins Rash    Has patient had a PCN reaction causing immediate rash, facial/tongue/throat swelling, SOB or lightheadedness with hypotension: Yes Has patient had a PCN reaction causing severe rash involving mucus membranes or skin necrosis: No Has patient had a PCN reaction that required hospitalization No Has patient had a PCN reaction occurring within the last 10 years: No If all of the above answers are "NO",  then may proceed with Cephalosporin use.   Current Outpatient Prescriptions  Medication Sig Dispense Refill  . acetaminophen (TYLENOL) 500 MG tablet Take 500 mg by mouth every 6 (six) hours as needed (for pain).     Marland Kitchen acetaminophen (TYLENOL) 650 MG CR tablet Take 650-1,300 mg by mouth every 6 (six) hours as needed (for pain).     Marland Kitchen albuterol (PROVENTIL HFA;VENTOLIN HFA) 108 (90 Base) MCG/ACT inhaler Inhale 2 puffs into the lungs every 6 (six) hours as needed for wheezing or shortness of breath. For shortness of breath (Patient taking differently: Inhale 2 puffs into the lungs every 6 (six) hours as needed for wheezing or shortness of breath. ) 54 g 0  . CALCIUM CITRATE PO Take 10 mLs by mouth daily.    . cyanocobalamin (,VITAMIN B-12,) 1000 MCG/ML injection Inject 1,000 mcg into the muscle every 30 (thirty) days.    . diclofenac sodium (VOLTAREN) 1 % GEL Apply 2 g topically 3 (three) times daily as needed (for knee pain). AND MAY USE UP TO AN ADDITIONAL 3 TIMES A DAY IF NEEDED    . EPINEPHrine (EPIPEN 2-PAK) 0.3 mg/0.3 mL IJ SOAJ injection Inject 0.3 mg  into the muscle once as needed (for severe allergic reaction).    . famotidine (PEPCID) 40 MG tablet Take 40 mg by mouth 2 (two) times daily as needed (for heartburn/indigestion.).     Marland Kitchen fluticasone (FLONASE) 50 MCG/ACT nasal spray Place 1 spray into both nostrils daily as needed for allergies or rhinitis. Reported on 04/01/2016    . fosfomycin (MONUROL) 3 g PACK Take 3 g by mouth once. 3 g 1  . hydrocortisone (ANUSOL-HC) 25 MG suppository Place 1 suppository (25 mg total) rectally 2 (two) times daily. (Patient taking differently: Place 25 mg rectally 2 (two) times daily. FOR 6 DAYS) 12 suppository 0  . HYDROmorphone (DILAUDID) 2 MG tablet Take 1 tablet (2 mg total) by mouth every 3 (three) hours as needed for moderate pain or severe pain. 24 tablet 0  . linaclotide (LINZESS) 290 MCG CAPS capsule Take 290 mcg by mouth daily before breakfast.    . Multiple Vitamins-Minerals (MULTIVITAMIN ADULT) CHEW Chew 1 tablet by mouth 2 (two) times daily.    . ondansetron (ZOFRAN-ODT) 8 MG disintegrating tablet Take 1 tablet (8 mg total) by mouth every 8 (eight) hours as needed for nausea. 20 tablet 3  . pantoprazole (PROTONIX) 40 MG tablet Take 40 mg by mouth 2 (two) times daily.     . phenazopyridine (PYRIDIUM) 200 MG tablet Take 1 tablet (200 mg total) by mouth 3 (three) times daily as needed for pain. 10 tablet 0  . promethazine (PHENERGAN) 25 MG tablet Take 1 tablet (25 mg total) by mouth every 6 (six) hours as needed for nausea or vomiting. 30 tablet 1  . sucralfate (CARAFATE) 1 g tablet Take 1 tablet (1 g total) by mouth 4 (four) times daily. 30 tablet 0  . thiamine (VITAMIN B-1) 100 MG tablet Take 100 mg by mouth daily.     Marland Kitchen thiamine 314 mg, folic acid 1 mg, multivitamins adult 10 mL in sodium chloride 0.9 % 1,000 mL Inject 1,000 mLs into the vein every Wednesday. [facility]     Current Facility-Administered Medications  Medication Dose Route Frequency Provider Last Rate Last Dose  .  cyanocobalamin ((VITAMIN B-12)) injection 1,000 mcg  1,000 mcg Intramuscular Q30 days Wardell Honour, MD   1,000 mcg at 06/22/17 1537  Facility-Administered Medications Ordered in Other Visits  Medication Dose Route Frequency Provider Last Rate Last Dose  . ondansetron (ZOFRAN-ODT) disintegrating tablet 4 mg  4 mg Oral Q4H PRN Excell Seltzer, MD       Or  . ondansetron (ZOFRAN) 4 mg in sodium chloride 0.9 % 50 mL IVPB  4 mg Intravenous Q4H PRN Excell Seltzer, MD   4 mg at 05/20/17 0813  . sodium chloride 0.9 % 1,000 mL with thiamine 449 mg, folic acid 1 mg, multivitamins adult 10 mL infusion   Intravenous Once Excell Seltzer, MD      . sodium chloride 0.9 % 1,000 mL with thiamine 675 mg, folic acid 1 mg, multivitamins adult 10 mL infusion   Intravenous Continuous Excell Seltzer, MD       Social History   Social History  . Marital status: Single    Spouse name: N/A  . Number of children: 3  . Years of education: Some college   Occupational History  . [employer]     Currently out on disability d/t surg   Social History Main Topics  . Smoking status: Former Smoker    Packs/day: 0.50    Years: 20.00    Types: Cigarettes    Quit date: 03/17/2014  . Smokeless tobacco: Never Used  . Alcohol use No  . Drug use: No  . Sexual activity: Not Currently    Birth control/ protection: Surgical   Other Topics Concern  . Not on file   Social History Narrative   Marital status:  Single; not dating seriously.      Children:  3 sons (18, 80, 59) whose father is deceased, 1 daughter (deceased); no grandchildren      Lives:  With 2 sons      Employment:  Full time; Corporate treasurer at [employer]      Tobacco: quit June 2015      Alcohol:  None      Drugs:  None      Exercise:  Leg lifts for knee strengthening; walking   1 caffeine beverage daily      Sexual activity:  Active; total partners = up there.  No STDs in past.        Right-handed      Family History    Problem Relation Age of Onset  . Hypertension Mother   . Diabetes Mother   . Sarcoidosis Mother        lungs and skin  . Asthma Mother   . Hypertension Father   . Diabetes Father   . Asthma Son   . Tics Son   . Asthma Sister   . Cancer Sister        possible pancreatic cancer  . Adrenal disorder Sister        Tumor   . Asthma Brother   . Asthma Daughter        died age 482.5  . Cancer Daughter 4       brain; died age 482.5  . Asthma Son   . Cancer Paternal Aunt        brain, colon, lung and esophagus; unsure of primary        Objective:    LMP 06/02/2017 Comment: hysterectomy 06/08/2017 Physical Exam  Constitutional: She is oriented to person, place, and time. She appears well-developed and well-nourished. No distress.  HENT:  Head: Normocephalic and atraumatic.  Right Ear: External ear normal.  Left Ear: External ear normal.  Nose:  Nose normal.  Mouth/Throat: Oropharynx is clear and moist.  Eyes: Pupils are equal, round, and reactive to light. Conjunctivae and EOM are normal.  Neck: Normal range of motion. Neck supple. Carotid bruit is not present. No thyromegaly present.  Cardiovascular: Normal rate, regular rhythm, normal heart sounds and intact distal pulses.  Exam reveals no gallop and no friction rub.   No murmur heard. Pulmonary/Chest: Effort normal and breath sounds normal. She has no wheezes. She has no rales.  Abdominal: Soft. Bowel sounds are normal. She exhibits no distension and no mass. There is no tenderness. There is no rebound and no guarding.  Lymphadenopathy:    She has no cervical adenopathy.  Neurological: She is alert and oriented to person, place, and time. No cranial nerve deficit.  Skin: Skin is warm and dry. No rash noted. She is not diaphoretic. No erythema. No pallor.  Psychiatric: She has a normal mood and affect. Her behavior is normal.   Results for orders placed or performed in visit on 06/22/17  POCT urinalysis dipstick  Result Value  Ref Range   Color, UA yellow    Clarity, UA clear    Glucose, UA negative    Bilirubin, UA negative    Ketones, UA negative    Spec Grav, UA 1.025 1.010 - 1.025   Blood, UA negative    pH, UA 6.5 5.0 - 8.0   Protein, UA negative    Urobilinogen, UA 1.0 0.2 or 1.0 E.U./dL   Nitrite, UA negative    Leukocytes, UA Negative Negative   No results found. Depression screen [facility] 2/9 06/23/2017 06/22/2017 05/19/2017 03/26/2017 02/26/2017  Decreased Interest 2 0 2 2 2   Down, Depressed, Hopeless 2 0 2 1 2   PHQ - 2 Score 4 0 4 3 4   Altered sleeping 2 - 2 1 2   Tired, decreased energy 2 - 3 3 3   Change in appetite 3 - 3 3 3   Feeling bad or failure about yourself  1 - 1 1 0  Trouble concentrating 0 - 3 2 1   Moving slowly or fidgety/restless 0 - 0 0 0  Suicidal thoughts - - 0 0 0  PHQ-9 Score 12 - 16 13 13   Difficult doing work/chores - - Somewhat difficult - -  Some recent data might be hidden   Fall Risk  06/22/2017 05/19/2017 02/23/2017 12/19/2016 09/22/2016  Falls in the past year? Yes Yes Yes Yes No  Number falls in past yr: 2 or more 2 or more 2 or more 1 -  Injury with Fall? No No No - -  Comment - no injury just bruises - - -  Follow up Falls evaluation completed - - - -        Assessment & Plan:  No diagnosis found.  No orders of the defined types were placed in this encounter.  No orders of the defined types were placed in this encounter.   No Follow-up on file.   Keavon Sensing Elayne Guerin, M.D. Primary Care at [facility] [place] [address] [place], [place]  [address] [phone] phone [phone] fax

## 2017-06-23 NOTE — Telephone Encounter (Signed)
Patient was seen today in our office

## 2017-06-28 ENCOUNTER — Telehealth: Payer: Self-pay | Admitting: *Deleted

## 2017-06-28 NOTE — Telephone Encounter (Signed)
Received call left on nurse voicemail at 1436.  Santiago Glad from [place] requests office note from Dr. Kennon Rounds dated 06/23/17.  States she sent [place] a fax request with release of information attached on 06/23/17.  Requests note be faxed to her at [phone].  Her number if we need to reach her is [phone] ext [phone].  States this information is needed to process the patient's short term disability.  Have sent a message to Vanessa Martin[place], Registrar to fax documents.

## 2017-06-29 ENCOUNTER — Ambulatory Visit (HOSPITAL_COMMUNITY)
Admission: RE | Admit: 2017-06-29 | Discharge: 2017-06-29 | Disposition: A | Discharge status: Home / Self Care | Payer: 59 | Source: Ambulatory Visit | Attending: General Surgery | Admitting: General Surgery

## 2017-06-29 DIAGNOSIS — E860 Dehydration: Secondary | ICD-10-CM | POA: Insufficient documentation

## 2017-06-29 DIAGNOSIS — Z4800 Encounter for change or removal of nonsurgical wound dressing: Secondary | ICD-10-CM | POA: Insufficient documentation

## 2017-06-29 DIAGNOSIS — Z9884 Bariatric surgery status: Secondary | ICD-10-CM | POA: Diagnosis not present

## 2017-06-29 MED ORDER — SODIUM CHLORIDE 0.9% FLUSH: 10.0000 mL | INTRAVENOUS | Status: DC | PRN

## 2017-06-29 MED ORDER — HEPARIN SOD (PORK) LOCK FLUSH 100 UNIT/ML IV SOLN
250.0000 [IU] | INTRAVENOUS | Status: AC | PRN
  Administered 2017-06-29: 250 [IU]
  Filled 2017-06-29: qty 5

## 2017-06-29 MED ORDER — THIAMINE HCL 100 MG/ML IJ SOLN
Freq: Once | INTRAVENOUS | Status: AC
  Administered 2017-06-29: 11:00:00 via INTRAVENOUS
  Filled 2017-06-29: qty 1000

## 2017-06-29 NOTE — Progress Notes (Signed)
Diagnosis: Dehydration   Ordering Provider: Excell Seltzer, MD   Procedure: IV infusion of NS with thiamine, folic acid, and multivitamin additives; PICC line dressing change per protocol    Pt tolerated infusion well. She was alert, oriented and ambulatory at discharge. D/C instructions given with verbal understanding

## 2017-07-06 ENCOUNTER — Ambulatory Visit (HOSPITAL_COMMUNITY)
Admission: RE | Admit: 2017-07-06 | Discharge: 2017-07-06 | Disposition: A | Discharge status: Home / Self Care | Payer: 59 | Source: Ambulatory Visit | Attending: General Surgery | Admitting: General Surgery

## 2017-07-06 DIAGNOSIS — E46 Unspecified protein-calorie malnutrition: Secondary | ICD-10-CM

## 2017-07-06 DIAGNOSIS — Z4800 Encounter for change or removal of nonsurgical wound dressing: Secondary | ICD-10-CM | POA: Diagnosis not present

## 2017-07-06 MED ORDER — THIAMINE HCL 100 MG/ML IJ SOLN
Freq: Once | INTRAVENOUS | Status: AC
  Administered 2017-07-06: 09:00:00 via INTRAVENOUS
  Filled 2017-07-06: qty 1000

## 2017-07-06 MED ORDER — SODIUM CHLORIDE 0.9% FLUSH
10.0000 mL | INTRAVENOUS | Status: AC | PRN
  Administered 2017-07-06: 10 mL

## 2017-07-06 MED ORDER — HEPARIN SOD (PORK) LOCK FLUSH 100 UNIT/ML IV SOLN
250.0000 [IU] | INTRAVENOUS | Status: AC | PRN
  Administered 2017-07-06: 250 [IU]
  Filled 2017-07-06: qty 5

## 2017-07-06 MED ORDER — ONDANSETRON 4 MG PO TBDP
4.0000 mg | ORAL_TABLET | Freq: Once | ORAL | Status: AC
  Administered 2017-07-06: 4 mg via ORAL
  Filled 2017-07-06: qty 1

## 2017-07-06 NOTE — Progress Notes (Signed)
Provider: Zannie Cove  Treatment: sodium chloride 0.9 % 1,000 mL with thiamine 209 mg, folic acid 1 mg, multivitamins adult 10 mL infusion    Patient tolerated procedure well.PICC line dressing changed per protocol.

## 2017-07-06 NOTE — Discharge Instructions (Signed)
Patient received sodium chloride 0.9 % 1,000 mL with thiamine 704 mg, folic acid 1 mg, multivitamins adult 10 mL infusion. Picc line dressing changed per protocol.

## 2017-07-13 ENCOUNTER — Ambulatory Visit (HOSPITAL_COMMUNITY)
Admission: RE | Admit: 2017-07-13 | Discharge: 2017-07-13 | Disposition: A | Discharge status: Home / Self Care | Payer: 59 | Source: Ambulatory Visit | Attending: General Surgery | Admitting: General Surgery

## 2017-07-13 DIAGNOSIS — Z4800 Encounter for change or removal of nonsurgical wound dressing: Secondary | ICD-10-CM | POA: Diagnosis not present

## 2017-07-13 MED ORDER — HEPARIN SOD (PORK) LOCK FLUSH 100 UNIT/ML IV SOLN
250.0000 [IU] | INTRAVENOUS | Status: AC | PRN
  Administered 2017-07-13: 250 [IU]
  Filled 2017-07-13: qty 5

## 2017-07-13 MED ORDER — SODIUM CHLORIDE 0.9% FLUSH
10.0000 mL | INTRAVENOUS | Status: AC | PRN
  Administered 2017-07-13: 10 mL

## 2017-07-13 MED ORDER — M.V.I. ADULT IV INJ
INJECTION | Freq: Once | INTRAVENOUS | Status: AC
  Administered 2017-07-13: 09:00:00 via INTRAVENOUS
  Filled 2017-07-13: qty 1000

## 2017-07-14 NOTE — Progress Notes (Signed)
Diagnosis: Dehydration   Ordering Provider: Excell Seltzer, MD   Procedure: IV infusion of NS with thiamine, folic acid, and multivitamin additives; PICC line dressing change per protocol    Pt tolerated infusion well. She was alert, oriented and ambulatory at discharge. D/C instructions given with verbal understanding

## 2017-07-20 ENCOUNTER — Ambulatory Visit (HOSPITAL_COMMUNITY)
Admission: RE | Admit: 2017-07-20 | Discharge: 2017-07-20 | Disposition: A | Discharge status: Home / Self Care | Payer: 59 | Source: Ambulatory Visit | Attending: General Surgery | Admitting: General Surgery

## 2017-07-20 DIAGNOSIS — Z4800 Encounter for change or removal of nonsurgical wound dressing: Secondary | ICD-10-CM | POA: Diagnosis not present

## 2017-07-20 MED ORDER — SODIUM CHLORIDE 0.9% FLUSH
10.0000 mL | INTRAVENOUS | Status: AC | PRN
  Administered 2017-07-20: 10 mL

## 2017-07-20 MED ORDER — HEPARIN SOD (PORK) LOCK FLUSH 100 UNIT/ML IV SOLN
250.0000 [IU] | INTRAVENOUS | Status: AC | PRN
  Administered 2017-07-20: 250 [IU]
  Filled 2017-07-20: qty 5

## 2017-07-20 MED ORDER — THIAMINE HCL 100 MG/ML IJ SOLN
Freq: Once | INTRAVENOUS | Status: AC
  Administered 2017-07-20: 09:00:00 via INTRAVENOUS
  Filled 2017-07-20: qty 1000

## 2017-07-20 MED ORDER — ONDANSETRON 4 MG PO TBDP
4.0000 mg | ORAL_TABLET | Freq: Once | ORAL | Status: AC
  Administered 2017-07-20: 4 mg via ORAL
  Filled 2017-07-20: qty 1

## 2017-07-20 NOTE — Progress Notes (Signed)
Diagnosis: Dehydration   Ordering Provider: Excell Seltzer, MD   Procedure: IV infusion of NS with thiamine, folic acid, and multivitamin additives.  Zofran given PO for nausea.  PICC line dressing change per protocol.    Pt tolerated infusion well. Nausea relieved with Zofran. She was alert, oriented and ambulatory at discharge. D/C instructions given with verbal understandingDiagnosis: Dehydration

## 2017-07-23 ENCOUNTER — Ambulatory Visit (INDEPENDENT_AMBULATORY_CARE_PROVIDER_SITE_OTHER): Payer: 59 | Admitting: Family Medicine

## 2017-07-23 DIAGNOSIS — B9689 Other specified bacterial agents as the cause of diseases classified elsewhere: Secondary | ICD-10-CM

## 2017-07-23 DIAGNOSIS — Z90710 Acquired absence of both cervix and uterus: Secondary | ICD-10-CM

## 2017-07-23 DIAGNOSIS — N760 Acute vaginitis: Secondary | ICD-10-CM | POA: Diagnosis not present

## 2017-07-23 DIAGNOSIS — B373 Candidiasis of vulva and vagina: Secondary | ICD-10-CM

## 2017-07-23 DIAGNOSIS — B3731 Acute candidiasis of vulva and vagina: Secondary | ICD-10-CM

## 2017-07-23 DIAGNOSIS — Z1151 Encounter for screening for human papillomavirus (HPV): Secondary | ICD-10-CM

## 2017-07-23 DIAGNOSIS — Z9889 Other specified postprocedural states: Secondary | ICD-10-CM

## 2017-07-23 LAB — POCT URINALYSIS DIP (DEVICE)
BILIRUBIN URINE: NEGATIVE
Glucose, UA: NEGATIVE mg/dL
Hgb urine dipstick: NEGATIVE
Ketones, ur: NEGATIVE mg/dL
Leukocytes, UA: NEGATIVE
NITRITE: NEGATIVE
PH: 5.5 (ref 5.0–8.0)
PROTEIN: NEGATIVE mg/dL
Specific Gravity, Urine: 1.02 (ref 1.005–1.030)
UROBILINOGEN UA: 0.2 mg/dL (ref 0.0–1.0)

## 2017-07-23 MED ORDER — FLUCONAZOLE 150 MG PO TABS: 150.0000 mg | ORAL_TABLET | Freq: Every day | ORAL | 2 refills | Status: DC

## 2017-07-23 NOTE — Progress Notes (Signed)
   Subjective:    Patient ID: [name] is a 41 y.o. female presenting with No chief complaint on file.  on 07/23/2017  HPI: Reports some vaginal itching and odor. Notes that she has had these symptoms for 4 wks. Has not tried anything. Treated for UTI.  Review of Systems  Constitutional: Negative for chills and fever.  Respiratory: Negative for shortness of breath.   Cardiovascular: Negative for chest pain.  Gastrointestinal: Negative for abdominal pain, nausea and vomiting.  Genitourinary: Negative for dysuria.  Skin: Negative for rash.      Objective:    LMP 06/02/2017 Comment: hysterectomy 06/08/2017 Physical Exam  Constitutional: She is oriented to person, place, and time. She appears well-developed and well-nourished. No distress.  HENT:  Head: Normocephalic and atraumatic.  Eyes: No scleral icterus.  Neck: Neck supple.  Cardiovascular: Normal rate.   Pulmonary/Chest: Effort normal.  Abdominal: Soft.  Genitourinary:  Genitourinary Comments: Vaginal cuff is intact. Curd like discharge noted. Suture removed.   Neurological: She is alert and oriented to person, place, and time.  Skin: Skin is warm and dry.  Psychiatric: She has a normal mood and affect.        Assessment & Plan:   Problem List Items Addressed This Visit      Unprioritized   S/P vaginal hysterectomy    Other Visit Diagnoses    Yeast vaginitis    -  Primary   Diflucan   Relevant Medications   fluconazole (DIFLUCAN) 150 MG tablet   Other Relevant Orders   POCT urinalysis dip (device) (Completed)   Cervicovaginal ancillary only      Return if symptoms worsen or fail to improve.  [name] 07/23/2017 9:41 AM

## 2017-07-23 NOTE — Patient Instructions (Signed)
Vaginal Yeast infection, Adult Vaginal yeast infection is a condition that causes soreness, swelling, and redness (inflammation) of the vagina. It also causes vaginal discharge. This is a common condition. Some women get this infection frequently. What are the causes? This condition is caused by a change in the normal balance of the yeast (candida) and bacteria that live in the vagina. This change causes an overgrowth of yeast, which causes the inflammation. What increases the risk? This condition is more likely to develop in:  Women who take antibiotic medicines.  Women who have diabetes.  Women who take birth control pills.  Women who are pregnant.  Women who douche often.  Women who have a weak defense (immune) system.  Women who have been taking steroid medicines for a long time.  Women who frequently wear tight clothing.  What are the signs or symptoms? Symptoms of this condition include:  White, thick vaginal discharge.  Swelling, itching, redness, and irritation of the vagina. The lips of the vagina (vulva) may be affected as well.  Pain or a burning feeling while urinating.  Pain during sex.  How is this diagnosed? This condition is diagnosed with a medical history and physical exam. This will include a pelvic exam. Your health care provider will examine a sample of your vaginal discharge under a microscope. Your health care provider may send this sample for testing to confirm the diagnosis. How is this treated? This condition is treated with medicine. Medicines may be over-the-counter or prescription. You may be told to use one or more of the following:  Medicine that is taken orally.  Medicine that is applied as a cream.  Medicine that is inserted directly into the vagina (suppository).  Follow these instructions at home:  Take or apply over-the-counter and prescription medicines only as told by your health care provider.  Do not have sex until your health  care provider has approved. Tell your sex partner that you have a yeast infection. That person should go to his or her health care provider if he or she develops symptoms.  Do not wear tight clothes, such as pantyhose or tight pants.  Avoid using tampons until your health care provider approves.  Eat more yogurt. This may help to keep your yeast infection from returning.  Try taking a sitz bath to help with discomfort. This is a warm water bath that is taken while you are sitting down. The water should only come up to your hips and should cover your buttocks. Do this 3-4 times per day or as told by your health care provider.  Do not douche.  Wear breathable, cotton underwear.  If you have diabetes, keep your blood sugar levels under control. Contact a health care provider if:  You have a fever.  Your symptoms go away and then return.  Your symptoms do not get better with treatment.  Your symptoms get worse.  You have new symptoms.  You develop blisters in or around your vagina.  You have blood coming from your vagina and it is not your menstrual period.  You develop pain in your abdomen. This information is not intended to replace advice given to you by your health care provider. Make sure you discuss any questions you have with your health care provider. Document Released: 06/24/2005 Document Revised: 02/26/2016 Document Reviewed: 03/18/2015 Elsevier Interactive Patient Education  2018 [employer]  

## 2017-07-26 ENCOUNTER — Encounter: Payer: Self-pay | Admitting: Family Medicine

## 2017-07-26 ENCOUNTER — Ambulatory Visit (INDEPENDENT_AMBULATORY_CARE_PROVIDER_SITE_OTHER): Payer: 59 | Admitting: Family Medicine

## 2017-07-26 VITALS — BP 104/64 | HR 95 | Temp 98.0°F | Resp 16 | Ht 67.32 in | Wt 168.0 lb

## 2017-07-26 DIAGNOSIS — R1013 Epigastric pain: Secondary | ICD-10-CM | POA: Diagnosis not present

## 2017-07-26 DIAGNOSIS — Z0000 Encounter for general adult medical examination without abnormal findings: Secondary | ICD-10-CM

## 2017-07-26 DIAGNOSIS — E538 Deficiency of other specified B group vitamins: Secondary | ICD-10-CM | POA: Diagnosis not present

## 2017-07-26 DIAGNOSIS — D508 Other iron deficiency anemias: Secondary | ICD-10-CM | POA: Diagnosis not present

## 2017-07-26 DIAGNOSIS — Z131 Encounter for screening for diabetes mellitus: Secondary | ICD-10-CM

## 2017-07-26 DIAGNOSIS — Z13220 Encounter for screening for lipoid disorders: Secondary | ICD-10-CM

## 2017-07-26 DIAGNOSIS — R112 Nausea with vomiting, unspecified: Secondary | ICD-10-CM

## 2017-07-26 DIAGNOSIS — E46 Unspecified protein-calorie malnutrition: Secondary | ICD-10-CM

## 2017-07-26 LAB — POCT URINALYSIS DIP (MANUAL ENTRY)
BILIRUBIN UA: NEGATIVE
BILIRUBIN UA: NEGATIVE mg/dL
GLUCOSE UA: NEGATIVE mg/dL
Leukocytes, UA: NEGATIVE
Nitrite, UA: NEGATIVE
Protein Ur, POC: NEGATIVE mg/dL
RBC UA: NEGATIVE
SPEC GRAV UA: 1.025 (ref 1.010–1.025)
Urobilinogen, UA: 0.2 E.U./dL
pH, UA: 5.5 (ref 5.0–8.0)

## 2017-07-26 LAB — CERVICOVAGINAL ANCILLARY ONLY
BACTERIAL VAGINITIS: POSITIVE — AB
CANDIDA VAGINITIS: POSITIVE — AB
Trichomonas: NEGATIVE

## 2017-07-26 MED ORDER — CYANOCOBALAMIN 1000 MCG/ML IJ SOLN
1000.0000 ug | INTRAMUSCULAR | Status: DC
  Administered 2017-07-26 – 2018-02-28 (×6): 1000 ug via INTRAMUSCULAR

## 2017-07-26 NOTE — Progress Notes (Signed)
Subjective:    Patient ID: [name], female    DOB: 05-06-1976, 41 y.o.   MRN: [id number]  07/26/2017  Abdominal Pain (with some back pain, follow-up from 9/25); B12 Injection; and Annual Exam    HPI This 41 y.o. female presents for Complete Physical examination and for evaluation of abdominal pain, vitamin B12 deficiency.  Last physical: 05-18-2015 Pap smear: 05-18-2015; hysterectomy 06/08/2017; [facility].  Colonoscopy: Eye exam:  Glasses; last year Dental exam:  2 months ago.  Goes to [facility] once weekly for iv fluids.  Banana bag.  Seeing surgeon once monthly. Highest weight 350 pounds; now 168 pounds.    PUD: having stomach cramping and nausea.  Feels like swallowing blades.   Carafate and Pepcid and Protonix.  Not seeing gastroenterologist.  Carlean Purl recommended following [facility] to stabilize issues.  Last EGD in 02/2017.  To repeat EGD in 09/2016.  Average vomiting after eating.  Continues eating.     BP Readings from Last 3 Encounters:  07/26/17 104/64  06/23/17 122/77  06/22/17 110/74   Wt Readings from Last 3 Encounters:  07/26/17 168 lb (76.2 kg)  06/23/17 167 lb (75.8 kg)  06/22/17 168 lb 6.4 oz (76.4 kg)   Immunization History  Administered Date(s) Administered  . Influenza,inj,Quad PF,6+ Mos 09/02/2015, 07/21/2016, 05/19/2017  . Influenza-Unspecified 07/30/2014  . Tdap 05/18/2015    Review of Systems  Constitutional: Negative for activity change, appetite change, chills, diaphoresis, fatigue, fever and unexpected weight change.  HENT: Negative for congestion, dental problem, drooling, ear discharge, ear pain, facial swelling, hearing loss, mouth sores, nosebleeds, postnasal drip, rhinorrhea, sinus pressure, sneezing, sore throat, tinnitus, trouble swallowing and voice change.   Eyes: Negative for photophobia, pain, discharge, redness, itching and visual disturbance.  Respiratory: Negative for apnea, cough, choking, chest tightness, shortness of  breath, wheezing and stridor.   Cardiovascular: Negative for chest pain, palpitations and leg swelling.  Gastrointestinal: Positive for abdominal pain, nausea and vomiting. Negative for abdominal distention, anal bleeding, blood in stool, constipation, diarrhea and rectal pain.  Endocrine: Negative for cold intolerance, heat intolerance, polydipsia, polyphagia and polyuria.  Genitourinary: Negative for decreased urine volume, difficulty urinating, dyspareunia, dysuria, enuresis, flank pain, frequency, genital sores, hematuria, menstrual problem, pelvic pain, urgency, vaginal bleeding, vaginal discharge and vaginal pain.       Nocturia x 0-1.  No urinary leakage.  Musculoskeletal: Positive for arthralgias. Negative for back pain, gait problem, joint swelling, myalgias, neck pain and neck stiffness.  Skin: Negative for color change, pallor, rash and wound.  Allergic/Immunologic: Negative for environmental allergies, food allergies and immunocompromised state.  Neurological: Negative for dizziness, tremors, seizures, syncope, facial asymmetry, speech difficulty, weakness, light-headedness, numbness and headaches.  Hematological: Negative for adenopathy. Does not bruise/bleed easily.  Psychiatric/Behavioral: Negative for agitation, behavioral problems, confusion, decreased concentration, dysphoric mood, hallucinations, self-injury, sleep disturbance and suicidal ideas. The patient is not nervous/anxious and is not hyperactive.        Bedtime 830-900; wakes up 700.    Past Medical History:  Diagnosis Date  . Anal fissure - posterior 10/16/2014  . Anxiety    doesn't take anything  . Asthma    has Albuterol inhaler as needed  . Boil    on pubic area started septra 03-01-17 draining blood and pus  . Bronchitis   . Chronic headache disorder 07/22/2016  . Clostridium difficile infection 04/20/2012  . Colitis   . Dehydration   . Depression    doesn't take any meds  .  Family history of adverse  reaction to anesthesia    pt mom gets sick  . GERD (gastroesophageal reflux disease)    takes Pantoprazole daily  . Headache   . Hiatal hernia    neuropathy - mild in arms and legs  . History of blood transfusion    last transfusion was 04/04/2016=Benadryl was given d/t itching. States she always itches with transfusion.   Marland Kitchen History of bronchitis    > 2 yrs ago  . History of colon polyps    benign  . History of migraine    last one 05/01/16  . History of stomach ulcers   . History of urinary tract infection   . IDA (iron deficiency anemia)   . Internal and external bleeding hemorrhoids 06/11/2014  . Joint pain   . Joint swelling   . Left sided chronic colitis - segmental 06/11/2014  . Migraine   . Motion sickness   . Nausea    takes Zofran as needed  . Nausea and vomiting    for 1 year  . Obesity   . Oligouria   . Osteoarthritis    lower back, knees, wrists - no meds  . Pneumonia 1997  . Postoperative nausea and vomiting 01/21/2016   wants scopolamine patch  . SVD (spontaneous vaginal delivery)    x 4  . TOBACCO USER 10/02/2009   Qualifier: Diagnosis of  By: Dimas Millin MD, Ellard Artis    . Transfusion history    last transfusion 6'16   . UC (ulcerative colitis) ([place])    supposed to be taking Lialda and Bentyl but has been off since gastric sleeve  . Ulcerative colitis ([place])   . Vertigo    doesn't take any meds   Past Surgical History:  Procedure Laterality Date  . ABDOMINAL HYSTERECTOMY    . COLONOSCOPY  2007   for rectal bleeding; [facility]  . COLONOSCOPY N/A 06/11/2014   Procedure: COLONOSCOPY;  Surgeon: Gatha Mayer, MD;  Location: [facility];  Service: Endoscopy;  Laterality: N/A;  . COLONOSCOPY WITH PROPOFOL N/A 03/02/2017   Procedure: COLONOSCOPY WITH PROPOFOL;  Surgeon: Alphonsa Overall, MD;  Location: [facility];  Service: General;  Laterality: N/A;  . DILATION AND CURETTAGE OF UTERUS    . ESOPHAGOGASTRODUODENOSCOPY (EGD) WITH PROPOFOL N/A 04/06/2016   Procedure:  ESOPHAGOGASTRODUODENOSCOPY (EGD) WITH PROPOFOL;  Surgeon: Alphonsa Overall, MD;  Location: [facility];  Service: General;  Laterality: N/A;  . ESOPHAGOGASTRODUODENOSCOPY (EGD) WITH PROPOFOL N/A 03/02/2017   Procedure: ESOPHAGOGASTRODUODENOSCOPY (EGD) WITH PROPOFOL;  Surgeon: Alphonsa Overall, MD;  Location: [facility];  Service: General;  Laterality: N/A;  . ESOPHAGOGASTRODUODENOSCOPY (EGD) WITH PROPOFOL N/A 05/20/2017   Procedure: ESOPHAGOGASTRODUODENOSCOPY (EGD) WITH PROPOFOL ERAS PATHWAY;  Surgeon: Alphonsa Overall, MD;  Location: [facility];  Service: General;  Laterality: N/A;  . EXCISION OF SKIN TAG  06/08/2017   Procedure: EXCISION OF VULVAR SKIN TAGS X2;  Surgeon: [name], MD;  Location: [facility];  Service: Gynecology;;  . FOOT SURGERY Bilateral    x 2  . GASTRIC BYPASS    . GASTRIC ROUX-EN-Y N/A 12/14/2016   Procedure: LAPAROSCOPIC REVISION SLEEVE GASTRECTOMY TO  ROUX-Y-GASTRIC BY-PASS, UPPER ENDO;  Surgeon: Excell Seltzer, MD;  Location: [facility];  Service: General;  Laterality: N/A;  . GASTROJEJUNOSTOMY N/A 05/11/2016   Procedure: LAPAROSCOPIC PLACEMENT  OF FEEDING  JEJUNOSTOMY TUBE;  Surgeon: Excell Seltzer, MD;  Location: [facility];  Service: General;  Laterality: N/A;  . HEMORRHOIDECTOMY WITH HEMORRHOID BANDING    . IR  FLUORO GUIDE CV LINE RIGHT  04/06/2017  . IR GENERIC HISTORICAL  05/19/2016   IR CM INJ ANY COLONIC TUBE W/FLUORO 05/19/2016 Aletta Edouard, MD [facility]  . IR US GUIDE VASC ACCESS RIGHT  04/06/2017  . j tube removed march 2018    . KNEE ARTHROSCOPY Left 09/06/2014  . LAPAROSCOPIC GASTRIC SLEEVE RESECTION N/A 01/14/2016   Procedure: LAPAROSCOPIC GASTRIC SLEEVE RESECTION;  Surgeon: Excell Seltzer, MD;  Location: [facility];  Service: General;  Laterality: N/A;  . LAPAROSCOPIC TUBAL LIGATION  10/16/2011   Procedure: LAPAROSCOPIC TUBAL LIGATION;  Surgeon: Alwyn Pea, MD;  Location: [facility];  Service: Gynecology;  Laterality: Bilateral;  . NOVASURE ABLATION  09/28/2010    mild persistent vaginal bleeding  . right knee arthroscopy     05/07/2016  . TUBAL LIGATION    . VAGINAL HYSTERECTOMY Bilateral 06/08/2017   Procedure: HYSTERECTOMY VAGINAL W/ BILATERAL SALPINGECTOMY;  Surgeon: [name], MD;  Location: [facility];  Service: Gynecology;  Laterality: Bilateral;  . wisdom teeth extracted     Allergies  Allergen Reactions  . Coconut Oil Anaphylaxis and Itching  . Morphine And Related Anaphylaxis and Hives    Pt states that she has tolerated Norco and Dilaudid  . Oxycodone Anaphylaxis  . Ciprofloxacin Itching and Rash  . Doxycycline Nausea And Vomiting  . Adhesive [Tape] Rash    And paper tape causes a rash if wearing for a prolong period of time  . Penicillins Rash    Has patient had a PCN reaction causing immediate rash, facial/tongue/throat swelling, SOB or lightheadedness with hypotension: Yes Has patient had a PCN reaction causing severe rash involving mucus membranes or skin necrosis: No Has patient had a PCN reaction that required hospitalization No Has patient had a PCN reaction occurring within the last 10 years: No If all of the above answers are "NO", then may proceed with Cephalosporin use.   Current Outpatient Prescriptions on File Prior to Visit  Medication Sig Dispense Refill  . acetaminophen (TYLENOL) 500 MG tablet Take 500 mg by mouth every 6 (six) hours as needed (for pain).     Marland Kitchen acetaminophen (TYLENOL) 650 MG CR tablet Take 650-1,300 mg by mouth every 6 (six) hours as needed (for pain).     Marland Kitchen albuterol (PROVENTIL HFA;VENTOLIN HFA) 108 (90 Base) MCG/ACT inhaler Inhale 2 puffs into the lungs every 6 (six) hours as needed for wheezing or shortness of breath. For shortness of breath (Patient taking differently: Inhale 2 puffs into the lungs every 6 (six) hours as needed for wheezing or shortness of breath. ) 54 g 0  . CALCIUM CITRATE PO Take 10 mLs by mouth daily.    . cyanocobalamin (,VITAMIN B-12,) 1000 MCG/ML injection Inject 1,000 mcg  into the muscle every 30 (thirty) days.    . diclofenac sodium (VOLTAREN) 1 % GEL Apply 2 g topically 3 (three) times daily as needed (for knee pain). AND MAY USE UP TO AN ADDITIONAL 3 TIMES A DAY IF NEEDED    . EPINEPHrine (EPIPEN 2-PAK) 0.3 mg/0.3 mL IJ SOAJ injection Inject 0.3 mg into the muscle once as needed (for severe allergic reaction).    . famotidine (PEPCID) 40 MG tablet Take 40 mg by mouth 2 (two) times daily as needed (for heartburn/indigestion.).     Marland Kitchen fluconazole (DIFLUCAN) 150 MG tablet Take 1 tablet (150 mg total) by mouth daily. Repeat in 24 hours if needed 2 tablet 2  . fluticasone (FLONASE) 50 MCG/ACT nasal spray Place  1 spray into both nostrils daily as needed for allergies or rhinitis. Reported on 04/01/2016    . hydrocortisone (ANUSOL-HC) 25 MG suppository Place 1 suppository (25 mg total) rectally 2 (two) times daily. 12 suppository 0  . linaclotide (LINZESS) 290 MCG CAPS capsule Take 290 mcg by mouth daily before breakfast.    . Multiple Vitamins-Minerals (MULTIVITAMIN ADULT) CHEW Chew 1 tablet by mouth 2 (two) times daily.    . ondansetron (ZOFRAN-ODT) 8 MG disintegrating tablet Take 1 tablet (8 mg total) by mouth every 8 (eight) hours as needed for nausea. 20 tablet 3  . pantoprazole (PROTONIX) 40 MG tablet Take 40 mg by mouth 2 (two) times daily.     . promethazine (PHENERGAN) 25 MG tablet Take 1 tablet (25 mg total) by mouth every 6 (six) hours as needed for nausea or vomiting. 30 tablet 1  . sucralfate (CARAFATE) 1 g tablet Take 1 tablet (1 g total) by mouth 4 (four) times daily. 30 tablet 0  . thiamine (VITAMIN B-1) 100 MG tablet Take 100 mg by mouth daily.     Marland Kitchen thiamine 474 mg, folic acid 1 mg, multivitamins adult 10 mL in sodium chloride 0.9 % 1,000 mL Inject 1,000 mLs into the vein every Wednesday. [facility]    . metroNIDAZOLE (FLAGYL) 500 MG tablet Take 1 tablet (500 mg total) by mouth 2 (two) times daily. 14 tablet 0   Current  Facility-Administered Medications on File Prior to Visit  Medication Dose Route Frequency Provider Last Rate Last Dose  . cyanocobalamin ((VITAMIN B-12)) injection 1,000 mcg  1,000 mcg Intramuscular Q30 days Wardell Honour, MD   1,000 mcg at 06/22/17 1537  . ondansetron (ZOFRAN-ODT) disintegrating tablet 4 mg  4 mg Oral Q4H PRN Excell Seltzer, MD       Or  . ondansetron (ZOFRAN) 4 mg in sodium chloride 0.9 % 50 mL IVPB  4 mg Intravenous Q4H PRN Excell Seltzer, MD   4 mg at 05/20/17 0813  . sodium chloride 0.9 % 1,000 mL with thiamine 259 mg, folic acid 1 mg, multivitamins adult 10 mL infusion   Intravenous Once Excell Seltzer, MD      . sodium chloride 0.9 % 1,000 mL with thiamine 563 mg, folic acid 1 mg, multivitamins adult 10 mL infusion   Intravenous Continuous Excell Seltzer, MD       Social History   Social History  . Marital status: Single    Spouse name: N/A  . Number of children: 3  . Years of education: Some college   Occupational History  . [employer]     Currently out on disability d/t surg   Social History Main Topics  . Smoking status: Former Smoker    Packs/day: 0.50    Years: 20.00    Types: Cigarettes    Quit date: 03/17/2014  . Smokeless tobacco: Never Used  . Alcohol use No  . Drug use: No  . Sexual activity: Not Currently    Birth control/ protection: Surgical   Other Topics Concern  . Not on file   Social History Narrative   Marital status:  Single; not dating seriously.      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  No STDs in  past.        Right-handed      Family History  Problem Relation Age of Onset  . Hypertension Mother   . Diabetes Mother   . Sarcoidosis Mother        lungs and skin  . Asthma Mother   . Hypertension Father   . Diabetes Father   . Asthma Son   . Tics Son   . Asthma Sister   . Cancer Sister        possible pancreatic cancer  . Adrenal disorder Sister        Tumor   . Asthma Brother   . Asthma Daughter        died age 66.5  . Cancer Daughter 4       brain; died age 66.5  . Asthma Son   . Cancer Paternal Aunt        brain, colon, lung and esophagus; unsure of primary        Objective:    BP 104/64   Pulse 95   Temp 98 F (36.7 C) (Oral)   Resp 16   Ht 5' 7.32" (1.71 m)   Wt 168 lb (76.2 kg)   LMP 06/02/2017 Comment: hysterectomy 06/08/2017  SpO2 95%   BMI 26.06 kg/m  Physical Exam  Constitutional: She is oriented to person, place, and time. She appears well-developed and well-nourished. No distress.  HENT:  Head: Normocephalic and atraumatic.  Right Ear: External ear normal.  Left Ear: External ear normal.  Nose: Nose normal.  Mouth/Throat: Oropharynx is clear and moist.  Eyes: Pupils are equal, round, and reactive to light. Conjunctivae and EOM are normal.  Neck: Normal range of motion and full passive range of motion without pain. Neck supple. No JVD present. Carotid bruit is not present. No thyromegaly present.  Cardiovascular: Normal rate, regular rhythm and normal heart sounds.  Exam reveals no gallop and no friction rub.   No murmur heard. Pulmonary/Chest: Effort normal and breath sounds normal. She has no wheezes. She has no rales.  Abdominal: Soft. Bowel sounds are normal. She exhibits no distension and no mass. There is no tenderness. There is no rebound and no guarding.  Musculoskeletal:       Right shoulder: Normal.       Left shoulder: Normal.       Cervical back: Normal.  Lymphadenopathy:    She has no cervical adenopathy.  Neurological: She is  alert and oriented to person, place, and time. She has normal reflexes. No cranial nerve deficit. She exhibits normal muscle tone. Coordination normal.  Skin: Skin is warm and dry. No rash noted. She is not diaphoretic. No erythema. No pallor.  Psychiatric: She has a normal mood and affect. Her behavior is normal. Judgment and thought content normal.  Nursing note and vitals reviewed.  No results found. Depression screen [facility] 2/9 07/26/2017 06/23/2017 06/22/2017 05/19/2017 03/26/2017  Decreased Interest 0 2 0 2 2  Down, Depressed, Hopeless 0 2 0 2 1  PHQ - 2 Score 0 4 0 4 3  Altered sleeping - 2 - 2 1  Tired,  decreased energy - 2 - 3 3  Change in appetite - 3 - 3 3  Feeling bad or failure about yourself  - 1 - 1 1  Trouble concentrating - 0 - 3 2  Moving slowly or fidgety/restless - 0 - 0 0  Suicidal thoughts - - - 0 0  PHQ-9 Score - 12 - 16 13  Difficult doing work/chores - - - Somewhat difficult -  Some recent data might be hidden   Fall Risk  07/26/2017 06/22/2017 05/19/2017 02/23/2017 12/19/2016  Falls in the past year? Yes Yes Yes Yes Yes  Number falls in past yr: 2 or more 2 or more 2 or more 2 or more 1  Injury with Fall? - No No No -  Comment - - no injury just bruises - -  Follow up - Falls evaluation completed - - -        Assessment & Plan:   1. Routine physical examination   2. Screening, lipid   3. Screening for diabetes mellitus   4. Epigastric pain   5. Nausea and vomiting, intractability of vomiting not specified, unspecified vomiting type   6. Iron deficiency anemia secondary to inadequate dietary iron intake   7. Malnutrition, calorie ([place])   8. Vitamin B12 deficiency    -anticipatory guidance provided --- exercise, weight loss, safe driving practices, calcium 660m bid. -obtain age appropriate screening labs and labs for chronic disease management. -persistent epigastric pain with chronic nausea and vomiting after bariatric surgery; going weekly for ivf via picc  line.  Upcoming follow-up with general surgeon regarding abdominal pain.  Continue PPI and H2 blocker with carafate. -weight has stabilized and at goal weight.   -s/p B12 injection in office.     Orders Placed This Encounter  Procedures  . Urine Culture    Order Specific Question:   Source    Answer:   clean catch  . CBC with Differential/Platelet  . Comprehensive metabolic panel    Order Specific Question:   Has the patient fasted?    Answer:   No  . Hemoglobin A1c  . Lipid panel    Order Specific Question:   Has the patient fasted?    Answer:   No  . TSH  . Vitamin B12  . VITAMIN D 25 Hydroxy (Vit-D Deficiency, Fractures)  . Iron  . Amylase  . Lipase  . POCT urinalysis dipstick   Meds ordered this encounter  Medications  . cyanocobalamin ((VITAMIN B-12)) injection 1,000 mcg    No Follow-up on file.   Kyvon Hu MElayne Guerin M.D. [facility]GKey[facility] [place]  [phone] [phone]

## 2017-07-26 NOTE — Patient Instructions (Addendum)
   IF you received an x-ray today, you will receive an invoice from [facility]. Please contact [facility] at [phone] with questions or concerns regarding your invoice.   IF you received labwork today, you will receive an invoice from [facility]. Please contact [facility] at [phone] with questions or concerns regarding your invoice.   Our billing staff will not be able to assist you with questions regarding bills from these companies.  You will be contacted with the lab results as soon as they are available. The fastest way to get your results is to activate your My Chart account. Instructions are located on the last page of this paperwork. If you have not heard from us regarding the results in 2 weeks, please contact this office.      Preventive Care 18-39 Years, Female Preventive care refers to lifestyle choices and visits with your health care provider that can promote health and wellness. What does preventive care include?  A yearly physical exam. This is also called an annual well check.  Dental exams once or twice a year.  Routine eye exams. Ask your health care provider how often you should have your eyes checked.  Personal lifestyle choices, including: ? Daily care of your teeth and gums. ? Regular physical activity. ? Eating a healthy diet. ? Avoiding tobacco and drug use. ? Limiting alcohol use. ? Practicing safe sex. ? Taking vitamin and mineral supplements as recommended by your health care provider. What happens during an annual well check? The services and screenings done by your health care provider during your annual well check will depend on your age, overall health, lifestyle risk factors, and family history of disease. Counseling Your health care provider may ask you questions about your:  Alcohol use.  Tobacco use.  Drug use.  Emotional well-being.  Home and relationship well-being.  Sexual activity.  Eating  habits.  Work and work environment.  Method of birth control.  Menstrual cycle.  Pregnancy history.  Screening You may have the following tests or measurements:  Height, weight, and BMI.  Diabetes screening. This is done by checking your blood sugar (glucose) after you have not eaten for a while (fasting).  Blood pressure.  Lipid and cholesterol levels. These may be checked every 5 years starting at age 20.  Skin check.  Hepatitis C blood test.  Hepatitis B blood test.  Sexually transmitted disease (STD) testing.  BRCA-related cancer screening. This may be done if you have a family history of breast, ovarian, tubal, or peritoneal cancers.  Pelvic exam and Pap test. This may be done every 3 years starting at age 21. Starting at age 30, this may be done every 5 years if you have a Pap test in combination with an HPV test.  Discuss your test results, treatment options, and if necessary, the need for more tests with your health care provider. Vaccines Your health care provider may recommend certain vaccines, such as:  Influenza vaccine. This is recommended every year.  Tetanus, diphtheria, and acellular pertussis (Tdap, Td) vaccine. You may need a Td booster every 10 years.  Varicella vaccine. You may need this if you have not been vaccinated.  HPV vaccine. If you are 26 or younger, you may need three doses over 6 months.  Measles, mumps, and rubella (MMR) vaccine. You may need at least one dose of MMR. You may also need a second dose.  Pneumococcal 13-valent conjugate (PCV13) vaccine. You may need this if you have certain conditions   conditions and were not previously vaccinated.  Pneumococcal polysaccharide (PPSV23) vaccine. You may need one or two doses if you smoke cigarettes or if you have certain conditions.  Meningococcal vaccine. One dose is recommended if you are age 19-21 years and a first-year college student living in a residence hall, or if you have one of several  medical conditions. You may also need additional booster doses.  Hepatitis A vaccine. You may need this if you have certain conditions or if you travel or work in places where you may be exposed to hepatitis A.  Hepatitis B vaccine. You may need this if you have certain conditions or if you travel or work in places where you may be exposed to hepatitis B.  Haemophilus influenzae type b (Hib) vaccine. You may need this if you have certain risk factors. Talk to your health care provider about which screenings and vaccines you need and how often you need them. This information is not intended to replace advice given to you by your health care provider. Make sure you discuss any questions you have with your health care provider. Document Released: 11/10/2001 Document Revised: 06/03/2016 Document Reviewed: 07/16/2015 Elsevier Interactive Patient Education  2017 Elsevier Inc.  

## 2017-07-27 ENCOUNTER — Ambulatory Visit (HOSPITAL_COMMUNITY)
Admission: RE | Admit: 2017-07-27 | Discharge: 2017-07-27 | Disposition: A | Discharge status: Home / Self Care | Payer: 59 | Source: Ambulatory Visit | Attending: General Surgery | Admitting: General Surgery

## 2017-07-27 DIAGNOSIS — Z4800 Encounter for change or removal of nonsurgical wound dressing: Secondary | ICD-10-CM | POA: Diagnosis not present

## 2017-07-27 LAB — LIPID PANEL
CHOL/HDL RATIO: 2.6 ratio (ref 0.0–4.4)
Cholesterol, Total: 186 mg/dL (ref 100–199)
HDL: 72 mg/dL (ref >39)
LDL CALC: 100 mg/dL — AB (ref 0–99)
TRIGLYCERIDES: 70 mg/dL (ref 0–149)
VLDL Cholesterol Cal: 14 mg/dL (ref 5–40)

## 2017-07-27 LAB — VITAMIN B12: VITAMIN B 12: 747 pg/mL (ref 232–1245)

## 2017-07-27 LAB — CBC WITH DIFFERENTIAL/PLATELET
BASOS ABS: 0 10*3/uL (ref 0.0–0.2)
BASOS: 0 %
EOS (ABSOLUTE): 0.2 10*3/uL (ref 0.0–0.4)
Eos: 3 %
HEMOGLOBIN: 10.1 g/dL — AB (ref 11.1–15.9)
Hematocrit: 32.2 % — ABNORMAL LOW (ref 34.0–46.6)
Immature Grans (Abs): 0 10*3/uL (ref 0.0–0.1)
Immature Granulocytes: 0 %
Lymphocytes Absolute: 3.6 10*3/uL — ABNORMAL HIGH (ref 0.7–3.1)
Lymphs: 47 %
MCH: 25.4 pg — AB (ref 26.6–33.0)
MCHC: 31.4 g/dL — AB (ref 31.5–35.7)
MCV: 81 fL (ref 79–97)
Monocytes Absolute: 0.3 10*3/uL (ref 0.1–0.9)
Monocytes: 4 %
NEUTROS ABS: 3.6 10*3/uL (ref 1.4–7.0)
Neutrophils: 46 %
Platelets: 332 10*3/uL (ref 150–379)
RBC: 3.98 x10E6/uL (ref 3.77–5.28)
RDW: 16.1 % — ABNORMAL HIGH (ref 12.3–15.4)
WBC: 7.8 10*3/uL (ref 3.4–10.8)

## 2017-07-27 LAB — LIPASE: Lipase: 36 U/L (ref 14–72)

## 2017-07-27 LAB — AMYLASE: AMYLASE: 81 U/L (ref 31–124)

## 2017-07-27 LAB — HEMOGLOBIN A1C
Est. average glucose Bld gHb Est-mCnc: 100 mg/dL
HEMOGLOBIN A1C: 5.1 % (ref 4.8–5.6)

## 2017-07-27 LAB — COMPREHENSIVE METABOLIC PANEL
A/G RATIO: 1.3 (ref 1.2–2.2)
ALBUMIN: 4.4 g/dL (ref 3.5–5.5)
ALT: 9 IU/L (ref 0–32)
AST: 17 IU/L (ref 0–40)
Alkaline Phosphatase: 69 IU/L (ref 39–117)
BILIRUBIN TOTAL: 0.2 mg/dL (ref 0.0–1.2)
BUN / CREAT RATIO: 14 (ref 9–23)
BUN: 11 mg/dL (ref 6–20)
CHLORIDE: 106 mmol/L (ref 96–106)
CO2: 21 mmol/L (ref 20–29)
Calcium: 9.4 mg/dL (ref 8.7–10.2)
Creatinine, Ser: 0.79 mg/dL (ref 0.57–1.00)
GFR calc non Af Amer: 95 mL/min/{1.73_m2} (ref >59)
GFR, EST AFRICAN AMERICAN: 109 mL/min/{1.73_m2} (ref >59)
GLUCOSE: 71 mg/dL (ref 65–99)
Globulin, Total: 3.4 g/dL (ref 1.5–4.5)
POTASSIUM: 4.5 mmol/L (ref 3.5–5.2)
SODIUM: 140 mmol/L (ref 134–144)
Total Protein: 7.8 g/dL (ref 6.0–8.5)

## 2017-07-27 LAB — TSH: TSH: 1.35 u[IU]/mL (ref 0.450–4.500)

## 2017-07-27 LAB — VITAMIN D 25 HYDROXY (VIT D DEFICIENCY, FRACTURES): VIT D 25 HYDROXY: 13.7 ng/mL — AB (ref 30.0–100.0)

## 2017-07-27 LAB — IRON: Iron: 25 ug/dL — ABNORMAL LOW (ref 27–159)

## 2017-07-27 MED ORDER — HEPARIN SOD (PORK) LOCK FLUSH 100 UNIT/ML IV SOLN
250.0000 [IU] | INTRAVENOUS | Status: AC | PRN
  Administered 2017-07-27: 250 [IU]

## 2017-07-27 MED ORDER — METRONIDAZOLE 500 MG PO TABS: 500.0000 mg | ORAL_TABLET | Freq: Two times a day (BID) | ORAL | 0 refills | Status: DC

## 2017-07-27 MED ORDER — SODIUM CHLORIDE 0.9% FLUSH
10.0000 mL | INTRAVENOUS | Status: AC | PRN
  Administered 2017-07-27: 10 mL

## 2017-07-27 MED ORDER — THIAMINE HCL 100 MG/ML IJ SOLN
Freq: Once | INTRAVENOUS | Status: AC
  Administered 2017-07-27: 09:00:00 via INTRAVENOUS
  Filled 2017-07-27: qty 1000

## 2017-07-27 NOTE — Addendum Note (Signed)
Addended by: [name] on: 07/27/2017 08:55 AM   Modules accepted: Orders

## 2017-07-27 NOTE — Progress Notes (Signed)
Diagnosis: Dehydration   Ordering Provider: Excell Seltzer, MD   Procedure: IV infusion of NS with thiamine, folic acid, and multivitamin additives; PICC line dressing change per protocol    Pt tolerated infusion well. She was alert, oriented and ambulatory at discharge. D/C instructions given with verbal understanding

## 2017-07-28 LAB — URINE CULTURE

## 2017-08-03 ENCOUNTER — Ambulatory Visit (HOSPITAL_COMMUNITY)
Admission: RE | Admit: 2017-08-03 | Discharge: 2017-08-03 | Disposition: A | Discharge status: Home / Self Care | Payer: 59 | Source: Ambulatory Visit | Attending: General Surgery | Admitting: General Surgery

## 2017-08-03 DIAGNOSIS — E860 Dehydration: Secondary | ICD-10-CM | POA: Insufficient documentation

## 2017-08-03 DIAGNOSIS — Z9884 Bariatric surgery status: Secondary | ICD-10-CM | POA: Diagnosis not present

## 2017-08-03 MED ORDER — THIAMINE HCL 100 MG/ML IJ SOLN
Freq: Once | INTRAVENOUS | Status: AC
  Administered 2017-08-03: 10:00:00 via INTRAVENOUS
  Filled 2017-08-03: qty 1000

## 2017-08-03 MED ORDER — ONDANSETRON 4 MG PO TBDP
4.0000 mg | ORAL_TABLET | ORAL | Status: DC | PRN
  Administered 2017-08-03: 4 mg via ORAL
  Filled 2017-08-03: qty 1

## 2017-08-03 MED ORDER — HEPARIN SOD (PORK) LOCK FLUSH 100 UNIT/ML IV SOLN: 250.0000 [IU] | INTRAVENOUS | Status: DC | PRN

## 2017-08-03 MED ORDER — SODIUM CHLORIDE 0.9% FLUSH: 10.0000 mL | INTRAVENOUS | Status: DC | PRN

## 2017-08-03 NOTE — Progress Notes (Signed)
Diagnosis- Hydration after gastric surgery   MD- [facility]  Patient received 1 L Banana bag. Patient tolerated well. Patient alert, oriented and ambulatory upon discharge.

## 2017-08-03 NOTE — Discharge Instructions (Signed)
Patient received 1 L infusion of sodium chloride with thiamine 056 mg, folic acid 1 mg and multivitamins (Banana Bag).

## 2017-08-10 ENCOUNTER — Ambulatory Visit (HOSPITAL_COMMUNITY)
Admission: RE | Admit: 2017-08-10 | Discharge: 2017-08-10 | Disposition: A | Discharge status: Home / Self Care | Payer: 59 | Source: Ambulatory Visit | Attending: Family Medicine | Admitting: Family Medicine

## 2017-08-10 DIAGNOSIS — E860 Dehydration: Secondary | ICD-10-CM | POA: Diagnosis not present

## 2017-08-10 DIAGNOSIS — Z9884 Bariatric surgery status: Secondary | ICD-10-CM

## 2017-08-10 MED ORDER — SODIUM CHLORIDE 0.9% FLUSH
10.0000 mL | INTRAVENOUS | Status: AC | PRN
  Administered 2017-08-10: 10 mL

## 2017-08-10 MED ORDER — ONDANSETRON 4 MG PO TBDP
4.0000 mg | ORAL_TABLET | Freq: Once | ORAL | Status: AC
  Administered 2017-08-10: 4 mg via ORAL
  Filled 2017-08-10: qty 1

## 2017-08-10 MED ORDER — HEPARIN SOD (PORK) LOCK FLUSH 100 UNIT/ML IV SOLN
250.0000 [IU] | INTRAVENOUS | Status: AC | PRN
  Administered 2017-08-10: 250 [IU]
  Filled 2017-08-10: qty 5

## 2017-08-10 MED ORDER — THIAMINE HCL 100 MG/ML IJ SOLN
Freq: Once | INTRAVENOUS | Status: AC
  Administered 2017-08-10: 09:00:00 via INTRAVENOUS
  Filled 2017-08-10: qty 1000

## 2017-08-10 NOTE — Discharge Instructions (Signed)
Patient received Sodium chloride 0.9 % 1,000 mL with thiamine 412 mg, folic acid 1 mg, multivitamins adult 10 mL infusion. PICC line dressing changedper protocol.

## 2017-08-10 NOTE — Progress Notes (Signed)
Diagnosis: Dehydration   Ordering Provider: Excell Seltzer, MD   Procedure: IV infusion of NS with thiamine, folic acid, and multivitamin additives; PICC line dressing change per protocol    Pt tolerated infusion well. She was alert, oriented and ambulatory at discharge. D/C instructions given with verbal understanding

## 2017-08-13 ENCOUNTER — Other Ambulatory Visit: Payer: Self-pay | Admitting: General Surgery

## 2017-08-13 DIAGNOSIS — E440 Moderate protein-calorie malnutrition: Secondary | ICD-10-CM

## 2017-08-17 ENCOUNTER — Other Ambulatory Visit: Payer: Self-pay | Admitting: Family Medicine

## 2017-08-17 ENCOUNTER — Ambulatory Visit (HOSPITAL_COMMUNITY)
Admission: RE | Admit: 2017-08-17 | Discharge: 2017-08-17 | Disposition: A | Discharge status: Home / Self Care | Payer: 59 | Source: Ambulatory Visit | Attending: General Surgery | Admitting: General Surgery

## 2017-08-17 ENCOUNTER — Other Ambulatory Visit: Payer: Self-pay | Admitting: Physician Assistant

## 2017-08-17 DIAGNOSIS — R300 Dysuria: Secondary | ICD-10-CM

## 2017-08-17 DIAGNOSIS — N760 Acute vaginitis: Secondary | ICD-10-CM

## 2017-08-17 DIAGNOSIS — B9689 Other specified bacterial agents as the cause of diseases classified elsewhere: Secondary | ICD-10-CM

## 2017-08-17 DIAGNOSIS — E860 Dehydration: Secondary | ICD-10-CM | POA: Diagnosis not present

## 2017-08-17 MED ORDER — SODIUM CHLORIDE 0.9% FLUSH
10.0000 mL | INTRAVENOUS | Status: AC | PRN
  Administered 2017-08-17: 10 mL

## 2017-08-17 MED ORDER — M.V.I. ADULT IV INJ
INJECTION | Freq: Once | INTRAVENOUS | Status: AC
  Administered 2017-08-17: 09:00:00 via INTRAVENOUS
  Filled 2017-08-17: qty 1000

## 2017-08-17 MED ORDER — ONDANSETRON 4 MG PO TBDP
4.0000 mg | ORAL_TABLET | Freq: Once | ORAL | Status: AC
  Administered 2017-08-17: 4 mg via ORAL
  Filled 2017-08-17: qty 1

## 2017-08-17 MED ORDER — HEPARIN SOD (PORK) LOCK FLUSH 100 UNIT/ML IV SOLN
250.0000 [IU] | INTRAVENOUS | Status: AC | PRN
  Administered 2017-08-17: 250 [IU]
  Filled 2017-08-17: qty 5

## 2017-08-17 NOTE — Telephone Encounter (Signed)
Please advise due to it being discontinued by another MD

## 2017-08-17 NOTE — Discharge Instructions (Signed)
Patient received Sodium chloride 0.9 % 1,000 mL with thiamine 473 mg, folic acid 1 mg, multivitamins adult 10 mL infusion. Dressing changed per protocol.

## 2017-08-17 NOTE — Progress Notes (Signed)
Provider: Zannie Cove  Treatment: sodium chloride 0.9 % 1,000 mL with thiamine 852 mg, folic acid 1 mg, multivitamins adult 10 mL infusion    Patient tolerated procedure well.PICC line dressing changedper protocol.

## 2017-08-18 ENCOUNTER — Telehealth: Payer: Self-pay | Admitting: General Practice

## 2017-08-18 ENCOUNTER — Ambulatory Visit: Payer: 59 | Admitting: Skilled Nursing Facility1

## 2017-08-18 NOTE — Telephone Encounter (Signed)
Patient called and left message stating she is a patient of Dr Virginia Crews. Patient states she was recently treated for a UTI and a bacterial infection but is requesting a refill on pyridium. Per chart review, Rx was sent in by another provider. Called and informed patient. Patient verbalized understanding & had no questions

## 2017-08-18 NOTE — Telephone Encounter (Signed)
Patient came in with her mother. Asked for this to be signed.  Meds ordered this encounter  Medications  . phenazopyridine (PYRIDIUM) 200 MG tablet    Sig: TAKE 1 TABLET BY MOUTH THREE TIMES A DAY AS NEEDED FOR PAIN    Dispense:  10 tablet    Refill:  0

## 2017-08-24 ENCOUNTER — Ambulatory Visit (HOSPITAL_COMMUNITY)
Admission: RE | Admit: 2017-08-24 | Discharge: 2017-08-24 | Disposition: A | Discharge status: Home / Self Care | Payer: 59 | Source: Ambulatory Visit | Attending: Family Medicine | Admitting: Family Medicine

## 2017-08-24 DIAGNOSIS — E860 Dehydration: Secondary | ICD-10-CM | POA: Diagnosis not present

## 2017-08-24 MED ORDER — THIAMINE HCL 100 MG/ML IJ SOLN
Freq: Once | INTRAVENOUS | Status: AC
  Administered 2017-08-24: 11:00:00 via INTRAVENOUS
  Filled 2017-08-24: qty 1000

## 2017-08-24 MED ORDER — HEPARIN SOD (PORK) LOCK FLUSH 100 UNIT/ML IV SOLN
250.0000 [IU] | INTRAVENOUS | Status: AC | PRN
  Administered 2017-08-24: 250 [IU]
  Filled 2017-08-24: qty 5

## 2017-08-24 MED ORDER — ONDANSETRON 4 MG PO TBDP
4.0000 mg | ORAL_TABLET | ORAL | Status: DC | PRN
  Administered 2017-08-24: 4 mg via ORAL
  Filled 2017-08-24: qty 1

## 2017-08-24 MED ORDER — SODIUM CHLORIDE 0.9% FLUSH
10.0000 mL | INTRAVENOUS | Status: AC | PRN
  Administered 2017-08-24: 10 mL

## 2017-08-24 NOTE — Discharge Instructions (Signed)
Today you received an infusion of thiamine 567 mg, folic acid 1 mg and multivitamins (Banana Bag).  PICC line dressing changed and PICC flushed with heparin.

## 2017-08-24 NOTE — Progress Notes (Signed)
Provider: Zannie Cove  Treatment: sodium chloride 0.9 % 1,000 mL with thiamine 287 mg, folic acid 1 mg, multivitamins adult 10 mL infusion    Patient tolerated procedure well.PICC line dressing changedper protocol

## 2017-08-31 ENCOUNTER — Ambulatory Visit (HOSPITAL_COMMUNITY)
Admission: RE | Admit: 2017-08-31 | Discharge: 2017-08-31 | Disposition: A | Discharge status: Home / Self Care | Payer: 59 | Source: Ambulatory Visit | Attending: General Surgery | Admitting: General Surgery

## 2017-08-31 DIAGNOSIS — E860 Dehydration: Secondary | ICD-10-CM | POA: Diagnosis not present

## 2017-08-31 DIAGNOSIS — E538 Deficiency of other specified B group vitamins: Secondary | ICD-10-CM | POA: Insufficient documentation

## 2017-08-31 DIAGNOSIS — D649 Anemia, unspecified: Secondary | ICD-10-CM | POA: Insufficient documentation

## 2017-08-31 DIAGNOSIS — E46 Unspecified protein-calorie malnutrition: Secondary | ICD-10-CM | POA: Insufficient documentation

## 2017-08-31 MED ORDER — ONDANSETRON 4 MG PO TBDP
4.0000 mg | ORAL_TABLET | ORAL | Status: DC | PRN
  Administered 2017-08-31: 4 mg via ORAL
  Filled 2017-08-31: qty 1

## 2017-08-31 MED ORDER — SODIUM CHLORIDE 0.9% FLUSH
10.0000 mL | INTRAVENOUS | Status: AC | PRN
  Administered 2017-08-31: 10 mL

## 2017-08-31 MED ORDER — HEPARIN SOD (PORK) LOCK FLUSH 100 UNIT/ML IV SOLN
250.0000 [IU] | INTRAVENOUS | Status: AC | PRN
  Administered 2017-08-31: 250 [IU]
  Filled 2017-08-31: qty 5

## 2017-08-31 MED ORDER — THIAMINE HCL 100 MG/ML IJ SOLN
Freq: Once | INTRAVENOUS | Status: AC
  Administered 2017-08-31: 09:00:00 via INTRAVENOUS
  Filled 2017-08-31: qty 1000

## 2017-08-31 NOTE — Progress Notes (Signed)
Provider: Zannie Cove  Treatment: Sodium chloride 0.9 % 1,000 mL with thiamine 552 mg, folic acid 1 mg, multivitamins adult 10 mL infusion    Patient tolerated procedure well.PICC line dressing changedper protocol

## 2017-08-31 NOTE — Discharge Instructions (Signed)
Patient received an IV infusion of Thiamine 935 mg, folic acid 1 mg and multivitamins (Banana Bag). PICC dressing changed and PICC flushed with heparin.

## 2017-09-01 ENCOUNTER — Encounter: Payer: Self-pay | Admitting: Skilled Nursing Facility1

## 2017-09-01 ENCOUNTER — Encounter: Payer: 59 | Attending: Family Medicine | Admitting: Skilled Nursing Facility1

## 2017-09-01 DIAGNOSIS — Z713 Dietary counseling and surveillance: Secondary | ICD-10-CM | POA: Diagnosis not present

## 2017-09-01 DIAGNOSIS — E46 Unspecified protein-calorie malnutrition: Secondary | ICD-10-CM

## 2017-09-01 DIAGNOSIS — Z9884 Bariatric surgery status: Secondary | ICD-10-CM | POA: Insufficient documentation

## 2017-09-01 DIAGNOSIS — Z6830 Body mass index (BMI) 30.0-30.9, adult: Secondary | ICD-10-CM | POA: Diagnosis not present

## 2017-09-01 DIAGNOSIS — E669 Obesity, unspecified: Secondary | ICD-10-CM | POA: Insufficient documentation

## 2017-09-01 NOTE — Patient Instructions (Signed)
-  Try kefir  -Take a calcium with your liquid multivitamin  -Keep trying to eat throughout the day

## 2017-09-01 NOTE — Progress Notes (Signed)
Post Op appt: Pt states she Does not like to leave the house due to self image/depression. Pt states she goes to the rehydration clinic once a week; Arives with a PICC line in her right arm been doing this for 5 weeks. Pt states she goes for an endoscopy tomorrow. Pt states she is able to have ice now. Pt states she is getting a hysterectomy in mid September. Pt states she sees Dr. Excell Seltzer every month. Pt states she still cannot get any food down. Pt states smelling food causes her to throw up. Pt is now having episodes of hearing loss. Pt is currently taking care of her mother after her surgery.   Pt arrives stating her moth was watering all day yesterday and starting at 4am this morning has been vomiting slim: thick saliva. Pt states she goes to the hydration clinic today. Pt is very tired and walking slowly. Pt states her heart rate has been in the 100's. Pt states she does not have the results of her endoscopy yet. Pt states her lower back is having severe pain. Pt states she has not been able to get anything down including fluids since her last visit. Pt states she is tired of trying to eat and drink because she just throws it up. Pt states she gets very out of breath easily. Pt states the linzess helps with cramping. Pt states she is still having episodes of hearing loss and feeling like she is under water. Pt states her knees having given out a couple times resulting in 2 falls.    Pt states she had not had a bowel movement since Saturday. Pt states her surgeon states her condition will correct itself and she will go in for an endoscopy at the beginning of the month. Pt states anything that involves chewing comes back up. Pt states she thinks her ulcers are getting better. Pt states she is still feeling dizzy and lightheaded and limited making of urine and defecating blood.  Pt states she has to have the house at 83 degrees due to not being able to regulate her internal temperature. Pt state she has  been doing the eliptical every mroning 30 minutes 2 times a day. Pt states she has been Taking liquid multi and liquid iron but no calcium.   Recall:  Sugar free vanilla iced coffee with creamer with unflavored protein powder  4-5 times a day (T) peanutbutter with unflavored protein powder in it (27g in protein powder) So only 1 protein powder packet in 1 day    Nutritional Diagnosis:  Inadequate fluid intake As related to little to no po intake and continuous emesis.  As evidenced by PICC line placement.    02/17/2017 03/08/2017 06/02/2017   BMI (kg/m^2) 28.1 27.4 27.3   Fat Mass (lbs) 71.8 64 62.6   Fat Free Mass (lbs) 105.2 111 106.8   Total Body Water (lbs) 75 79 76   Surgery date: 12/14/2016 Surgery type: RYGB [facility]: 230 Weight today: 169.4  TANITA  BODY COMP RESULTS  12/29/2016 05/19/2017   BMI (kg/m^2) 31.1 26.7   Fat Mass (lbs) 86.4 64   Fat Free Mass (lbs) 109.4 104.2   Total Body Water (lbs) 78.8 74    Fluid intake: [employer] probiotic juice, Gatorade, limeade/blueberry aid (insuffiencient)  Estimated total protein intake: insufficient   Medications: See List Supplementation: none due to vomiting  Recent physical activity:  ADL's  Goals: -Try kefir -Take a calcium with your liquid multivitamin -Keep  trying to eat throughout the day

## 2017-09-06 ENCOUNTER — Ambulatory Visit: Payer: 59 | Admitting: Family Medicine

## 2017-09-09 ENCOUNTER — Ambulatory Visit (HOSPITAL_COMMUNITY)
Admission: RE | Admit: 2017-09-09 | Discharge: 2017-09-09 | Disposition: A | Discharge status: Home / Self Care | Payer: 59 | Source: Ambulatory Visit | Attending: General Surgery | Admitting: General Surgery

## 2017-09-09 DIAGNOSIS — E860 Dehydration: Secondary | ICD-10-CM | POA: Diagnosis not present

## 2017-09-09 MED ORDER — ONDANSETRON 4 MG PO TBDP
4.0000 mg | ORAL_TABLET | Freq: Once | ORAL | Status: AC
  Administered 2017-09-09: 4 mg via ORAL
  Filled 2017-09-09: qty 1

## 2017-09-09 MED ORDER — HEPARIN SOD (PORK) LOCK FLUSH 100 UNIT/ML IV SOLN
250.0000 [IU] | INTRAVENOUS | Status: AC | PRN
  Administered 2017-09-09: 250 [IU]
  Filled 2017-09-09: qty 5

## 2017-09-09 MED ORDER — SODIUM CHLORIDE 0.9% FLUSH
10.0000 mL | INTRAVENOUS | Status: AC | PRN
  Administered 2017-09-09: 10 mL

## 2017-09-09 MED ORDER — THIAMINE HCL 100 MG/ML IJ SOLN
Freq: Once | INTRAVENOUS | Status: AC
  Administered 2017-09-09: 09:00:00 via INTRAVENOUS
  Filled 2017-09-09: qty 1000

## 2017-09-09 NOTE — Progress Notes (Signed)
Provider: Zannie Cove  Treatment: sodium chloride 0.9 % 1,000 mL with thiamine 943 mg, folic acid 1 mg, multivitamins adult 10 mL infusion    Patient tolerated infustion well.PICC line dressing changedper protocol.

## 2017-09-09 NOTE — Discharge Instructions (Signed)
Patient received IV infusion of Thiamine, Folic Acid and Multivitamins (Banana Bag).

## 2017-09-16 DIAGNOSIS — Z9884 Bariatric surgery status: Secondary | ICD-10-CM | POA: Diagnosis not present

## 2017-09-16 DIAGNOSIS — K283 Acute gastrojejunal ulcer without hemorrhage or perforation: Secondary | ICD-10-CM | POA: Diagnosis not present

## 2017-09-17 ENCOUNTER — Ambulatory Visit (HOSPITAL_COMMUNITY)
Admission: RE | Admit: 2017-09-17 | Discharge: 2017-09-17 | Disposition: A | Discharge status: Home / Self Care | Payer: 59 | Source: Ambulatory Visit | Attending: General Surgery | Admitting: General Surgery

## 2017-09-17 DIAGNOSIS — E860 Dehydration: Secondary | ICD-10-CM | POA: Diagnosis not present

## 2017-09-17 MED ORDER — SODIUM CHLORIDE 0.9% FLUSH
10.0000 mL | INTRAVENOUS | Status: AC | PRN
  Administered 2017-09-17: 10 mL

## 2017-09-17 MED ORDER — HEPARIN SOD (PORK) LOCK FLUSH 100 UNIT/ML IV SOLN
250.0000 [IU] | INTRAVENOUS | Status: AC | PRN
  Administered 2017-09-17: 250 [IU]
  Filled 2017-09-17: qty 5

## 2017-09-17 NOTE — Progress Notes (Signed)
[facility] NOTE    Provider: Dr. Excell Seltzer   Procedure: PICC line dressing change and flush   Note: PICC line dressing changed and line flushed with heparin.

## 2017-09-17 NOTE — Discharge Instructions (Signed)
Patient dressing changed per protocol.

## 2017-09-22 ENCOUNTER — Other Ambulatory Visit: Payer: Self-pay

## 2017-09-22 ENCOUNTER — Encounter: Payer: Self-pay | Admitting: Family Medicine

## 2017-09-22 ENCOUNTER — Ambulatory Visit (INDEPENDENT_AMBULATORY_CARE_PROVIDER_SITE_OTHER): Payer: 59 | Admitting: Family Medicine

## 2017-09-22 ENCOUNTER — Ambulatory Visit (HOSPITAL_COMMUNITY)
Admission: RE | Admit: 2017-09-22 | Discharge: 2017-09-22 | Disposition: A | Discharge status: Home / Self Care | Payer: 59 | Source: Ambulatory Visit | Attending: General Surgery | Admitting: General Surgery

## 2017-09-22 VITALS — BP 110/68 | HR 82 | Temp 98.0°F | Resp 16 | Ht 67.32 in | Wt 178.0 lb

## 2017-09-22 DIAGNOSIS — F4323 Adjustment disorder with mixed anxiety and depressed mood: Secondary | ICD-10-CM

## 2017-09-22 DIAGNOSIS — R002 Palpitations: Secondary | ICD-10-CM

## 2017-09-22 DIAGNOSIS — E46 Unspecified protein-calorie malnutrition: Secondary | ICD-10-CM

## 2017-09-22 DIAGNOSIS — D508 Other iron deficiency anemias: Secondary | ICD-10-CM | POA: Diagnosis not present

## 2017-09-22 DIAGNOSIS — Z9884 Bariatric surgery status: Secondary | ICD-10-CM

## 2017-09-22 DIAGNOSIS — E860 Dehydration: Secondary | ICD-10-CM

## 2017-09-22 LAB — COMPREHENSIVE METABOLIC PANEL
ALK PHOS: 51 U/L (ref 38–126)
ALT: 15 U/L (ref 14–54)
AST: 20 U/L (ref 15–41)
Albumin: 3.6 g/dL (ref 3.5–5.0)
Anion gap: 4 — ABNORMAL LOW (ref 5–15)
BUN: 11 mg/dL (ref 6–20)
CALCIUM: 8.5 mg/dL — AB (ref 8.9–10.3)
CHLORIDE: 109 mmol/L (ref 101–111)
CO2: 21 mmol/L — AB (ref 22–32)
CREATININE: 0.63 mg/dL (ref 0.44–1.00)
Glucose, Bld: 68 mg/dL (ref 65–99)
Potassium: 3.9 mmol/L (ref 3.5–5.1)
SODIUM: 134 mmol/L — AB (ref 135–145)
Total Bilirubin: 0.5 mg/dL (ref 0.3–1.2)
Total Protein: 7 g/dL (ref 6.5–8.1)

## 2017-09-22 LAB — CBC WITH DIFFERENTIAL/PLATELET
BASOS PCT: 1 %
Basophils Absolute: 0 10*3/uL (ref 0.0–0.1)
Eosinophils Absolute: 0.2 10*3/uL (ref 0.0–0.7)
Eosinophils Relative: 3 %
HEMATOCRIT: 27 % — AB (ref 36.0–46.0)
HEMOGLOBIN: 8.2 g/dL — AB (ref 12.0–15.0)
LYMPHS ABS: 2.7 10*3/uL (ref 0.7–4.0)
LYMPHS PCT: 37 %
MCH: 23.4 pg — ABNORMAL LOW (ref 26.0–34.0)
MCHC: 30.4 g/dL (ref 30.0–36.0)
MCV: 77.1 fL — AB (ref 78.0–100.0)
MONO ABS: 0.4 10*3/uL (ref 0.1–1.0)
MONOS PCT: 6 %
NEUTROS ABS: 4 10*3/uL (ref 1.7–7.7)
NEUTROS PCT: 53 %
Platelets: 292 10*3/uL (ref 150–400)
RBC: 3.5 MIL/uL — ABNORMAL LOW (ref 3.87–5.11)
RDW: 16.4 % — AB (ref 11.5–15.5)
WBC: 7.4 10*3/uL (ref 4.0–10.5)

## 2017-09-22 LAB — FERRITIN: Ferritin: 5 ng/mL — ABNORMAL LOW (ref 11–307)

## 2017-09-22 LAB — VITAMIN B12: VITAMIN B 12: 591 pg/mL (ref 180–914)

## 2017-09-22 MED ORDER — SODIUM CHLORIDE 0.9% FLUSH
10.0000 mL | INTRAVENOUS | Status: AC | PRN
  Administered 2017-09-22: 10 mL

## 2017-09-22 MED ORDER — ONDANSETRON 4 MG PO TBDP
4.0000 mg | ORAL_TABLET | Freq: Once | ORAL | Status: AC
  Administered 2017-09-22: 4 mg via ORAL
  Filled 2017-09-22: qty 1

## 2017-09-22 MED ORDER — HEPARIN SOD (PORK) LOCK FLUSH 100 UNIT/ML IV SOLN
250.0000 [IU] | INTRAVENOUS | Status: AC | PRN
  Administered 2017-09-22: 250 [IU]
  Filled 2017-09-22: qty 5

## 2017-09-22 MED ORDER — FLUOXETINE HCL 10 MG PO TABS: 10.0000 mg | ORAL_TABLET | Freq: Every day | ORAL | 3 refills | Status: DC

## 2017-09-22 MED ORDER — M.V.I. ADULT IV INJ
INJECTION | Freq: Once | INTRAVENOUS | Status: AC
  Administered 2017-09-22: 10:00:00 via INTRAVENOUS
  Filled 2017-09-22: qty 1000

## 2017-09-22 NOTE — Progress Notes (Signed)
Subjective:    Patient ID: [name], female    DOB: 1976-06-18, 41 y.o.   MRN: [id number]  09/22/2017  B12 Deficiency (1 month follow-up need injection today ) and Anemia    HPI This 41 y.o. female presents for evaluation of vitamin b12 deficiency and anemia and dehydration and malnutrition following gastric bypass.  Having to take Carafate four times daily by [place]. Had a nice Christmas.  Asked for games for Christmas; also asked for snacks.   Not pooping; having constipation. Eating yet not staying down.  The only thing that stays down is peanut butter with protein powder. Chocolate flavored protein powder.  Nasty protein powder flavor.eating a lot of protein powder.Freezes protein powder. Protonix bid. Making honey bun cakes for Christmas. Sad a lot; wants to eat good feed.   Was walking.  Back hurts too badly to walk; knee hurts too badly to walk; must get healthy to get knee done.  When sitting down, heart will start pounding really heart.  Will get twitches in anterior chest wall on RIGHT.  Heart rate will range 91-99-133.    BP Readings from Last 3 Encounters:  10/07/17 125/65  09/22/17 110/68  07/26/17 104/64   Wt Readings from Last 3 Encounters:  10/07/17 178 lb (80.7 kg)  09/22/17 178 lb (80.7 kg)  09/01/17 177 lb 12.8 oz (80.6 kg)   Immunization History  Administered Date(s) Administered  . Influenza,inj,Quad PF,6+ Mos 09/02/2015, 07/21/2016, 05/19/2017  . Influenza-Unspecified 07/30/2014  . Tdap 05/18/2015    Review of Systems  Constitutional: Negative for chills, diaphoresis, fatigue and fever.  Eyes: Negative for visual disturbance.  Respiratory: Negative for cough and shortness of breath.   Cardiovascular: Positive for chest pain and palpitations. Negative for leg swelling.  Gastrointestinal: Positive for abdominal pain. Negative for abdominal distention, anal bleeding, blood in stool, constipation, diarrhea, nausea, rectal pain and vomiting.    Endocrine: Negative for cold intolerance, heat intolerance, polydipsia, polyphagia and polyuria.  Musculoskeletal: Positive for arthralgias, back pain and gait problem.  Neurological: Negative for dizziness, tremors, seizures, syncope, facial asymmetry, speech difficulty, weakness, light-headedness, numbness and headaches.  Psychiatric/Behavioral: Positive for dysphoric mood. The patient is nervous/anxious.     Past Medical History:  Diagnosis Date  . Anal fissure - posterior 10/16/2014  . Anxiety    doesn't take anything  . Asthma    has Albuterol inhaler as needed  . Boil    on pubic area started septra 03-01-17 draining blood and pus  . Bronchitis   . Chronic headache disorder 07/22/2016  . Clostridium difficile infection 04/20/2012  . Colitis   . Dehydration   . Depression    doesn't take any meds  . Family history of adverse reaction to anesthesia    pt mom gets sick  . GERD (gastroesophageal reflux disease)    takes Pantoprazole daily  . Headache   . Hiatal hernia    neuropathy - mild in arms and legs  . History of blood transfusion    last transfusion was 04/04/2016=Benadryl was given d/t itching. States she always itches with transfusion.   Marland Kitchen History of bronchitis    > 2 yrs ago  . History of colon polyps    benign  . History of migraine    last one 05/01/16  . History of stomach ulcers   . History of urinary tract infection   . IDA (iron deficiency anemia)   . Internal and external bleeding hemorrhoids 06/11/2014  . Joint pain   .  Joint swelling   . Left sided chronic colitis - segmental 06/11/2014  . Migraine   . Motion sickness   . Nausea    takes Zofran as needed  . Nausea and vomiting    for 1 year  . Obesity   . Oligouria   . Osteoarthritis    lower back, knees, wrists - no meds  . Pneumonia 1997  . Postoperative nausea and vomiting 01/21/2016   wants scopolamine patch  . SVD (spontaneous vaginal delivery)    x 4  . TOBACCO USER 10/02/2009   Qualifier:  Diagnosis of  By: Dimas Millin MD, Ellard Artis    . Transfusion history    last transfusion 6'16   . UC (ulcerative colitis) ([place])    supposed to be taking Lialda and Bentyl but has been off since gastric sleeve  . Ulcerative colitis ([place])   . Vertigo    doesn't take any meds   Past Surgical History:  Procedure Laterality Date  . ABDOMINAL HYSTERECTOMY    . COLONOSCOPY  2007   for rectal bleeding; [facility]  . COLONOSCOPY N/A 06/11/2014   Procedure: COLONOSCOPY;  Surgeon: Gatha Mayer, MD;  Location: [facility];  Service: Endoscopy;  Laterality: N/A;  . COLONOSCOPY WITH PROPOFOL N/A 03/02/2017   Procedure: COLONOSCOPY WITH PROPOFOL;  Surgeon: Alphonsa Overall, MD;  Location: [facility];  Service: General;  Laterality: N/A;  . DILATION AND CURETTAGE OF UTERUS    . ESOPHAGOGASTRODUODENOSCOPY (EGD) WITH PROPOFOL N/A 04/06/2016   Procedure: ESOPHAGOGASTRODUODENOSCOPY (EGD) WITH PROPOFOL;  Surgeon: Alphonsa Overall, MD;  Location: [facility];  Service: General;  Laterality: N/A;  . ESOPHAGOGASTRODUODENOSCOPY (EGD) WITH PROPOFOL N/A 03/02/2017   Procedure: ESOPHAGOGASTRODUODENOSCOPY (EGD) WITH PROPOFOL;  Surgeon: Alphonsa Overall, MD;  Location: [facility];  Service: General;  Laterality: N/A;  . ESOPHAGOGASTRODUODENOSCOPY (EGD) WITH PROPOFOL N/A 05/20/2017   Procedure: ESOPHAGOGASTRODUODENOSCOPY (EGD) WITH PROPOFOL ERAS PATHWAY;  Surgeon: Alphonsa Overall, MD;  Location: [facility];  Service: General;  Laterality: N/A;  . ESOPHAGOGASTRODUODENOSCOPY (EGD) WITH PROPOFOL N/A 10/07/2017   Procedure: ESOPHAGOGASTRODUODENOSCOPY (EGD) WITH PROPOFOL;  Surgeon: Alphonsa Overall, MD;  Location: [facility];  Service: General;  Laterality: N/A;  . EXCISION OF SKIN TAG  06/08/2017   Procedure: EXCISION OF VULVAR SKIN TAGS X2;  Surgeon: [name], MD;  Location: [facility];  Service: Gynecology;;  . FOOT SURGERY Bilateral    x 2  . GASTRIC BYPASS    . GASTRIC ROUX-EN-Y N/A 12/14/2016   Procedure: LAPAROSCOPIC REVISION SLEEVE  GASTRECTOMY TO  ROUX-Y-GASTRIC BY-PASS, UPPER ENDO;  Surgeon: Excell Seltzer, MD;  Location: [facility];  Service: General;  Laterality: N/A;  . GASTROJEJUNOSTOMY N/A 05/11/2016   Procedure: LAPAROSCOPIC PLACEMENT  OF FEEDING  JEJUNOSTOMY TUBE;  Surgeon: Excell Seltzer, MD;  Location: [facility];  Service: General;  Laterality: N/A;  . HEMORRHOIDECTOMY WITH HEMORRHOID BANDING    . IR FLUORO GUIDE CV LINE RIGHT  04/06/2017  . IR GENERIC HISTORICAL  05/19/2016   IR CM INJ ANY COLONIC TUBE W/FLUORO 05/19/2016 Aletta Edouard, MD [facility]  . IR US GUIDE VASC ACCESS RIGHT  04/06/2017  . j tube removed march 2018    . KNEE ARTHROSCOPY Left 09/06/2014  . LAPAROSCOPIC GASTRIC SLEEVE RESECTION N/A 01/14/2016   Procedure: LAPAROSCOPIC GASTRIC SLEEVE RESECTION;  Surgeon: Excell Seltzer, MD;  Location: [facility];  Service: General;  Laterality: N/A;  . LAPAROSCOPIC TUBAL LIGATION  10/16/2011   Procedure: LAPAROSCOPIC TUBAL LIGATION;  Surgeon: Alwyn Pea, MD;  Location: [facility];  Service: Gynecology;  Laterality: Bilateral;  . NOVASURE ABLATION  09/28/2010   mild persistent vaginal bleeding  . right knee arthroscopy     05/07/2016  . TUBAL LIGATION    . VAGINAL HYSTERECTOMY Bilateral 06/08/2017   Procedure: HYSTERECTOMY VAGINAL W/ BILATERAL SALPINGECTOMY;  Surgeon: [name], MD;  Location: [facility];  Service: Gynecology;  Laterality: Bilateral;  . wisdom teeth extracted     Allergies  Allergen Reactions  . Coconut Oil Anaphylaxis and Itching  . Morphine And Related Anaphylaxis and Hives    Pt states that she has tolerated Norco and Dilaudid  . Oxycodone Anaphylaxis  . Ciprofloxacin Itching and Rash  . Doxycycline Nausea And Vomiting  . Adhesive [Tape] Rash    And paper tape causes a rash if wearing for a prolong period of time  . Penicillins Rash    Has patient had a PCN reaction causing immediate rash, facial/tongue/throat swelling, SOB or lightheadedness with hypotension: Yes Has patient had a  PCN reaction causing severe rash involving mucus membranes or skin necrosis: No Has patient had a PCN reaction that required hospitalization No Has patient had a PCN reaction occurring within the last 10 years: No If all of the above answers are "NO", then may proceed with Cephalosporin use.   Current Outpatient Medications on File Prior to Visit  Medication Sig Dispense Refill  . acetaminophen (TYLENOL) 500 MG tablet Take 500 mg by mouth every 6 (six) hours as needed (for pain).     Marland Kitchen acetaminophen (TYLENOL) 650 MG CR tablet Take 650-1,300 mg by mouth every 6 (six) hours as needed (for pain).     Marland Kitchen albuterol (PROVENTIL HFA;VENTOLIN HFA) 108 (90 Base) MCG/ACT inhaler Inhale 2 puffs into the lungs every 6 (six) hours as needed for wheezing or shortness of breath. For shortness of breath (Patient taking differently: Inhale 2 puffs into the lungs every 6 (six) hours as needed for wheezing or shortness of breath. ) 54 g 0  . CALCIUM CITRATE PO Take 10 mLs by mouth daily.    . cyanocobalamin (,VITAMIN B-12,) 1000 MCG/ML injection Inject 1,000 mcg into the muscle every 30 (thirty) days.    Marland Kitchen EPINEPHrine (EPIPEN 2-PAK) 0.3 mg/0.3 mL IJ SOAJ injection Inject 0.3 mg into the muscle once as needed (for severe allergic reaction).    . famotidine (PEPCID) 40 MG tablet Take 40 mg by mouth 2 (two) times daily as needed (for heartburn/indigestion.).     Marland Kitchen fluconazole (DIFLUCAN) 150 MG tablet Take 1 tablet (150 mg total) by mouth daily. Repeat in 24 hours if needed (Patient not taking: Reported on 10/06/2017) 2 tablet 2  . fluticasone (FLONASE) 50 MCG/ACT nasal spray Place 1 spray into both nostrils daily as needed for allergies or rhinitis. Reported on 04/01/2016    . hydrocortisone (ANUSOL-HC) 25 MG suppository Place 1 suppository (25 mg total) rectally 2 (two) times daily. 12 suppository 0  . linaclotide (LINZESS) 290 MCG CAPS capsule Take 290 mcg by mouth daily before breakfast.    . Multiple Vitamins-Minerals  (MULTIVITAMIN ADULT) CHEW Chew 1 tablet by mouth 2 (two) times daily.    . ondansetron (ZOFRAN-ODT) 8 MG disintegrating tablet Take 1 tablet (8 mg total) by mouth every 8 (eight) hours as needed for nausea. 20 tablet 3  . pantoprazole (PROTONIX) 40 MG tablet Take 40 mg by mouth 2 (two) times daily.     . sucralfate (CARAFATE) 1 g tablet Take 1 tablet (1 g total) by mouth 4 (four) times  daily. 30 tablet 0  . thiamine (VITAMIN B-1) 100 MG tablet Take 100 mg by mouth daily.     Marland Kitchen thiamine 974 mg, folic acid 1 mg, multivitamins adult 10 mL in sodium chloride 0.9 % 1,000 mL Inject 1,000 mLs into the vein every Wednesday. [facility]     Current Facility-Administered Medications on File Prior to Visit  Medication Dose Route Frequency Provider Last Rate Last Dose  . cyanocobalamin ((VITAMIN B-12)) injection 1,000 mcg  1,000 mcg Intramuscular Q30 days Wardell Honour, MD   1,000 mcg at 06/22/17 1537  . cyanocobalamin ((VITAMIN B-12)) injection 1,000 mcg  1,000 mcg Intramuscular Q30 days Wardell Honour, MD   1,000 mcg at 09/22/17 1834  . ondansetron (ZOFRAN-ODT) disintegrating tablet 4 mg  4 mg Oral Q4H PRN Excell Seltzer, MD       Or  . ondansetron (ZOFRAN) 4 mg in sodium chloride 0.9 % 50 mL IVPB  4 mg Intravenous Q4H PRN Excell Seltzer, MD   4 mg at 05/20/17 0813  . sodium chloride 0.9 % 1,000 mL with thiamine 163 mg, folic acid 1 mg, multivitamins adult 10 mL infusion   Intravenous Once Excell Seltzer, MD      . sodium chloride 0.9 % 1,000 mL with thiamine 845 mg, folic acid 1 mg, multivitamins adult 10 mL infusion   Intravenous Continuous Excell Seltzer, MD       Social History   Socioeconomic History  . Marital status: Single    Spouse name: Not on file  . Number of children: 3  . Years of education: Some college  . Highest education level: Not on file  Social Needs  . Financial resource strain: Not on file  . Food insecurity - worry: Not on file  . Food  insecurity - inability: Not on file  . Transportation needs - medical: Not on file  . Transportation needs - non-medical: Not on file  Occupational History  . Occupation: Production designer, theatre/television/film    Comment: Currently out on disability d/t surg  Tobacco Use  . Smoking status: Former Smoker    Packs/day: 0.50    Years: 20.00    Pack years: 10.00    Types: Cigarettes    Last attempt to quit: 03/17/2014    Years since quitting: 3.5  . Smokeless tobacco: Never Used  Substance and Sexual Activity  . Alcohol use: No    Alcohol/week: 0.0 oz  . Drug use: No  . Sexual activity: Not Currently    Birth control/protection: Surgical  Other Topics Concern  . Not on file  Social History Narrative   Marital status:  Single; not dating seriously.      Children:  3 sons (50, 72, 81) whose father is deceased, 1 daughter (deceased); no grandchildren      Lives:  With 2 sons      Employment:  Long term disability in 2018 due to PICC line for iv fluids; Corporate treasurer at [employer]      Tobacco: quit June 2015      Alcohol:  None      Drugs:  None      Exercise:  Leg lifts for knee strengthening; walking   1 caffeine beverage daily      Sexual activity:  Active; total partners = up there.  No STDs in past.        Right-handed   Family History  Problem Relation Age of Onset  . Hypertension Mother   .  Diabetes Mother   . Sarcoidosis Mother        lungs and skin  . Asthma Mother   . Hypertension Father   . Diabetes Father   . Asthma Son   . Tics Son   . Asthma Sister   . Cancer Sister        possible pancreatic cancer  . Adrenal disorder Sister        Tumor   . Asthma Brother   . Asthma Daughter        died age 77.5  . Cancer Daughter 4       brain; died age 77.5  . Asthma Son   . Cancer Paternal Aunt        brain, colon, lung and esophagus; unsure of primary        Objective:    BP 110/68   Pulse 82   Temp 98 F (36.7 C) (Oral)   Resp 16   Ht 5' 7.32" (1.71 m)   Wt 178 lb  (80.7 kg)   LMP 06/02/2017 Comment: hysterectomy 06/08/2017  SpO2 96%   BMI 27.61 kg/m  Physical Exam  Constitutional: She is oriented to person, place, and time. She appears well-developed and well-nourished. No distress.  HENT:  Head: Normocephalic and atraumatic.  Right Ear: External ear normal.  Left Ear: External ear normal.  Nose: Nose normal.  Mouth/Throat: Oropharynx is clear and moist.  Eyes: Conjunctivae and EOM are normal. Pupils are equal, round, and reactive to light.  Neck: Normal range of motion. Neck supple. Carotid bruit is not present. No thyromegaly present.  Cardiovascular: Normal rate, regular rhythm, normal heart sounds and intact distal pulses. Exam reveals no gallop and no friction rub.  No murmur heard. Pulmonary/Chest: Effort normal and breath sounds normal. She has no wheezes. She has no rales.  Abdominal: Soft. Bowel sounds are normal. She exhibits no distension and no mass. There is no tenderness. There is no rebound and no guarding.  Lymphadenopathy:    She has no cervical adenopathy.  Neurological: She is alert and oriented to person, place, and time. No cranial nerve deficit.  Skin: Skin is warm and dry. No rash noted. She is not diaphoretic. No erythema. No pallor.  Psychiatric: She has a normal mood and affect. Her behavior is normal. Judgment and thought content normal.   No results found. Depression screen [facility] 2/9 09/22/2017 07/26/2017 06/23/2017 06/22/2017 05/19/2017  Decreased Interest 1 0 2 0 2  Down, Depressed, Hopeless 1 0 2 0 2  PHQ - 2 Score 2 0 4 0 4  Altered sleeping 1 - 2 - 2  Tired, decreased energy 1 - 2 - 3  Change in appetite 0 - 3 - 3  Feeling bad or failure about yourself  1 - 1 - 1  Trouble concentrating 0 - 0 - 3  Moving slowly or fidgety/restless 0 - 0 - 0  Suicidal thoughts 0 - - - 0  PHQ-9 Score 5 - 12 - 16  Difficult doing work/chores - - - - Somewhat difficult  Some recent data might be hidden   Fall Risk  09/22/2017  07/26/2017 06/22/2017 05/19/2017 02/23/2017  Falls in the past year? Yes Yes Yes Yes Yes  Number falls in past yr: 2 or more 2 or more 2 or more 2 or more 2 or more  Injury with Fall? No - No No No  Comment - - - no injury just bruises -  Follow up - - Falls  evaluation completed - -        Assessment & Plan:   1. Adjustment disorder with mixed anxiety and depressed mood   2. Palpitations   3. Iron deficiency anemia secondary to inadequate dietary iron intake   4. Dehydration   5. Malnutrition, calorie ([place])   6. S/P laparoscopic sleeve gastrectomy     New onset anxiety and depression following sleeve gastrectomy with complications including ongoing nausea and vomiting and dehydration.  Initiate fluoxetine 10 mg 1 daily.  Follow-up in 4 weeks with plan to increase fluoxetine if well tolerated.  Consider psychotherapy in the future.  New onset palpitations which is most consistent with dehydration and orthostatic hypotension.  However, warrants further evaluation by cardiology due to the extent tachycardia.  Obtain EKG in office today.  Recent labs reviewed.  Follow very closely by general surgery due to ongoing dehydration.  Requiring her regular IV fluids to maintain hydration rate.  Now is able to eat protein in the form of peanut butter.  Has gained some weight from last visit.  Orders Placed This Encounter  Procedures  . Ambulatory referral to Cardiology    Referral Priority:   Routine    Referral Type:   Consultation    Referral Reason:   Specialty Services Required    Requested Specialty:   Cardiology    Number of Visits Requested:   1  . EKG 12-Lead    Order Specific Question:   Where should this test be performed    Answer:   Other   Meds ordered this encounter  Medications  . FLUoxetine (PROZAC) 10 MG tablet    Sig: Take 1 tablet (10 mg total) by mouth daily.    Dispense:  30 tablet    Refill:  3    Return in about 4 weeks (around 10/20/2017) for recheck.   Trennon Torbeck  Elayne Guerin, M.D. Primary Care at [facility] [address] [place], [place]  [address] [phone] phone [phone] fax

## 2017-09-22 NOTE — Progress Notes (Signed)
Pt arrived for scheduled infusion of IV fluids per order; pt states that she removed her PICC line dressing over the weekend and replaced it herself; dressing was previously changed on 09/17/17 per protocol; pt informed that she should not remove dressing because of risk of infection and risk of removal of line from arm; pt verbalizes understanding; new, sterlle dressing to be placed today; will continue to monitor

## 2017-09-22 NOTE — Progress Notes (Signed)
Pt received infusion of Sodium chloride 0.9 % 1,000 mL with thiamine 875 mg, folic acid 1 mg, multivitamins adult 10 mL infusion; Labs drawn per order; PICC line dressing changed per protocol; Pt educated on not manipulating PICC line or dressing while at home because of risk for infection; pt verbalizes understanding  [facility] lab called to note that lab tube for iron and vit D would need to be redrawn; MD office notified, spoke with Abigail Butts

## 2017-09-22 NOTE — Patient Instructions (Signed)
     IF you received an x-ray today, you will receive an invoice from [facility]. Please contact [facility] at [phone] with questions or concerns regarding your invoice.   IF you received labwork today, you will receive an invoice from [facility]. Please contact [facility] at [phone] with questions or concerns regarding your invoice.   Our billing staff will not be able to assist you with questions regarding bills from these companies.  You will be contacted with the lab results as soon as they are available. The fastest way to get your results is to activate your My Chart account. Instructions are located on the last page of this paperwork. If you have not heard from us regarding the results in 2 weeks, please contact this office.     

## 2017-09-25 LAB — VITAMIN B1: Vitamin B1 (Thiamine): 99.5 nmol/L (ref 66.5–200.0)

## 2017-09-29 ENCOUNTER — Ambulatory Visit (HOSPITAL_COMMUNITY)
Admission: RE | Admit: 2017-09-29 | Discharge: 2017-09-29 | Disposition: A | Discharge status: Home / Self Care | Payer: 59 | Source: Ambulatory Visit | Attending: General Surgery | Admitting: General Surgery

## 2017-09-29 DIAGNOSIS — R109 Unspecified abdominal pain: Secondary | ICD-10-CM | POA: Insufficient documentation

## 2017-09-29 DIAGNOSIS — R112 Nausea with vomiting, unspecified: Secondary | ICD-10-CM | POA: Insufficient documentation

## 2017-09-29 DIAGNOSIS — K289 Gastrojejunal ulcer, unspecified as acute or chronic, without hemorrhage or perforation: Secondary | ICD-10-CM | POA: Diagnosis not present

## 2017-09-29 LAB — COMPREHENSIVE METABOLIC PANEL
ALBUMIN: 3.6 g/dL (ref 3.5–5.0)
ALK PHOS: 53 U/L (ref 38–126)
ALT: 14 U/L (ref 14–54)
ANION GAP: 4 — AB (ref 5–15)
AST: 19 U/L (ref 15–41)
BUN: 13 mg/dL (ref 6–20)
CALCIUM: 8.6 mg/dL — AB (ref 8.9–10.3)
CO2: 22 mmol/L (ref 22–32)
Chloride: 111 mmol/L (ref 101–111)
Creatinine, Ser: 0.58 mg/dL (ref 0.44–1.00)
GFR calc non Af Amer: >60 mL/min (ref >60)
GLUCOSE: 80 mg/dL (ref 65–99)
Potassium: 4.1 mmol/L (ref 3.5–5.1)
Sodium: 137 mmol/L (ref 135–145)
Total Bilirubin: 0.5 mg/dL (ref 0.3–1.2)
Total Protein: 7.1 g/dL (ref 6.5–8.1)

## 2017-09-29 LAB — CBC WITH DIFFERENTIAL/PLATELET
BASOS PCT: 0 %
Basophils Absolute: 0 10*3/uL (ref 0.0–0.1)
EOS PCT: 4 %
Eosinophils Absolute: 0.2 10*3/uL (ref 0.0–0.7)
HEMATOCRIT: 27.2 % — AB (ref 36.0–46.0)
Hemoglobin: 8.2 g/dL — ABNORMAL LOW (ref 12.0–15.0)
LYMPHS PCT: 40 %
Lymphs Abs: 2.5 10*3/uL (ref 0.7–4.0)
MCH: 22.9 pg — ABNORMAL LOW (ref 26.0–34.0)
MCHC: 30.1 g/dL (ref 30.0–36.0)
MCV: 76 fL — AB (ref 78.0–100.0)
Monocytes Absolute: 0.3 10*3/uL (ref 0.1–1.0)
Monocytes Relative: 5 %
NEUTROS ABS: 3.1 10*3/uL (ref 1.7–7.7)
Neutrophils Relative %: 51 %
PLATELETS: 292 10*3/uL (ref 150–400)
RBC: 3.58 MIL/uL — AB (ref 3.87–5.11)
RDW: 15.8 % — AB (ref 11.5–15.5)
WBC: 6.1 10*3/uL (ref 4.0–10.5)

## 2017-09-29 LAB — FERRITIN: Ferritin: 4 ng/mL — ABNORMAL LOW (ref 11–307)

## 2017-09-29 LAB — VITAMIN B12: VITAMIN B 12: 908 pg/mL (ref 180–914)

## 2017-09-29 MED ORDER — THIAMINE HCL 100 MG/ML IJ SOLN
Freq: Once | INTRAVENOUS | Status: AC
  Administered 2017-09-29: 09:00:00 via INTRAVENOUS
  Filled 2017-09-29: qty 1000

## 2017-09-29 MED ORDER — ONDANSETRON 4 MG PO TBDP
4.0000 mg | ORAL_TABLET | Freq: Once | ORAL | Status: AC
  Administered 2017-09-29: 4 mg via ORAL
  Filled 2017-09-29: qty 1

## 2017-09-29 MED ORDER — HEPARIN SOD (PORK) LOCK FLUSH 100 UNIT/ML IV SOLN
250.0000 [IU] | INTRAVENOUS | Status: AC | PRN
  Administered 2017-09-29: 250 [IU]
  Filled 2017-09-29: qty 5

## 2017-09-29 MED ORDER — SODIUM CHLORIDE 0.9% FLUSH: 10.0000 mL | INTRAVENOUS | Status: DC | PRN

## 2017-09-29 NOTE — Discharge Instructions (Signed)
Patient received infusion of Sodium Cloride 0.9%, 1,000 mL with thiamine 222 mg, folic acid 1 mg and multivitamins adult 10 mL (Banana Bag). Labs drawn and PICC line dressing changed.

## 2017-09-29 NOTE — Progress Notes (Signed)
PATIENT CARE CENTER NOTE  Diagnosis: Dehydration    Provider: Dr. Excell Seltzer   Procedure: Banana bag infusion and lab draw   Note: Patient received infusion of Sodium Cloride 0.9%, 1,000 mL with thiamine 998 mg, folic acid 1 mg and multivitamins adult 10 mL (Banana Bag) with no complications. Labs taken and PICC line flushed and dressing changed. Patient given discharge instructions. Patient alert, oriented and ambulatory at discharge.

## 2017-09-30 LAB — VITAMIN D 25 HYDROXY (VIT D DEFICIENCY, FRACTURES): VIT D 25 HYDROXY: 10.5 ng/mL — AB (ref 30.0–100.0)

## 2017-10-01 LAB — VITAMIN B1: Vitamin B1 (Thiamine): 84.4 nmol/L (ref 66.5–200.0)

## 2017-10-04 ENCOUNTER — Ambulatory Visit: Payer: Self-pay | Admitting: Surgery

## 2017-10-04 DIAGNOSIS — Z9884 Bariatric surgery status: Secondary | ICD-10-CM | POA: Diagnosis not present

## 2017-10-04 DIAGNOSIS — E860 Dehydration: Secondary | ICD-10-CM | POA: Diagnosis not present

## 2017-10-06 ENCOUNTER — Ambulatory Visit (HOSPITAL_COMMUNITY)
Admission: RE | Admit: 2017-10-06 | Discharge: 2017-10-06 | Disposition: A | Discharge status: Home / Self Care | Payer: 59 | Source: Ambulatory Visit | Attending: General Surgery | Admitting: General Surgery

## 2017-10-06 DIAGNOSIS — K289 Gastrojejunal ulcer, unspecified as acute or chronic, without hemorrhage or perforation: Secondary | ICD-10-CM | POA: Diagnosis not present

## 2017-10-06 MED ORDER — SODIUM CHLORIDE 0.9% FLUSH
10.0000 mL | INTRAVENOUS | Status: AC | PRN
  Administered 2017-10-06: 10 mL

## 2017-10-06 MED ORDER — HEPARIN SOD (PORK) LOCK FLUSH 100 UNIT/ML IV SOLN
250.0000 [IU] | INTRAVENOUS | Status: AC | PRN
  Administered 2017-10-06: 250 [IU]
  Filled 2017-10-06: qty 5

## 2017-10-06 MED ORDER — M.V.I. ADULT IV INJ
INJECTION | Freq: Once | INTRAVENOUS | Status: AC
  Administered 2017-10-06: 10:00:00 via INTRAVENOUS
  Filled 2017-10-06: qty 1000

## 2017-10-06 MED ORDER — ONDANSETRON 4 MG PO TBDP
4.0000 mg | ORAL_TABLET | Freq: Once | ORAL | Status: AC
  Administered 2017-10-06: 4 mg via ORAL
  Filled 2017-10-06: qty 1

## 2017-10-06 NOTE — H&P (Signed)
[name]  Location: [facility] Surgery Patient #: [id number] DOB: November 30, 1975 Single / Language: [name] / Race: Black or African American Female  History of Present Illness   The patient is a 42 year old female presenting status-post bariatric surgery. 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 Also GERD. She had a feeding jejunostomy that was removed at the time of her gastric bypass. She unfortunately has had persistent difficulty with oral intake and nausea. We did get an upper GI series that showed a small hiatal hernia but no obstruction. There was some slight irregularity in the upper pouch and ulcer was questioned.   Dr. Lucia Gaskins performed upper endoscopy (03/02/2017 and 05/20/2017) which did show 2 small marginal ulcers. She remains on Carafate and Protonix which she is getting down.   She tolerates liquids moderately but really cannot eat any solid foods due to some pain but mostly nausea and vomiting. We did place a PICC line so she can get weekly hydration. This seems to help her dizziness. She states she still having vomiting and some pain after eating but may have found a couple of things she can keep down better such as peanut butter and also eating protein powder which stays down. Dr. Lucia Gaskins repeated endoscopy in August, that showed still marginal ulceration although it appeared slightly improved from her original endoscopy.  She has gone through a hysterectomy for dysfunctional uterine bleeding and failed ablation. She tolerated this well.  Lab work 06/14/17 shows normal electrolytes, BUN/creatinine, albumin 3.8 and normal LFTs. White count normal. Hemoglobin was 8.9. Vitamin levels were all normal in June. Vitamin D level normal last visit.   Past Medical History: 1. 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 Upper endoscopy -  04/04/2016 - [facility] 3. Depression 4. Chronic joint  5. Ulcerative colitis Last colonoscopy by Dr. Carlean Purl - 06/11/2014 - he saw segmental colitis   Allergies Mammie Lorenzo, LPN; 04/54/0981 19:14 AM) MORPHINE  Doxycycline Hyclate *Tetracyclines**  Penicillin G Pot in Dextrose *PENICILLINS*  OxyCODONE HCl *ANALGESICS - OPIOID*  Ciprofloxacin HCl *Fluoroquinolones**  Zofran ODT *ANTIEMETICS*   Medication History Mammie Lorenzo, LPN; 78/29/5621 30:86 AM) Zofran ODT (4MG Tablet Disint, 1 (one) Oral every six hours, as needed, Taken starting 08/13/2017) Active. Protonix (40MG Tablet DR, 1 (one) Oral two times daily, Taken starting 06/10/2017) Active. Thiamine HCl (100MG/ML Solution, Injection) Active. Thiamine HCl (100MG Tablet, Oral) Active. Hydrocortisone (25MG Suppository, Rectal) Active. Monurol (3GM Packet, Oral) Active. Flonase (50MCG/ACT Suspension, Nasal) Active. Diclofenac Sodium (Transdermal) Specific strength unknown - Active. Acetaminophen (500MG Tablet, Oral) Active. Acetaminophen (650MG Tablet, Oral) Active. Calcium Citrate-Vitamin D3 (Oral) Specific strength unknown - Active. Alisa Graff Vitamin D3 (1000UNIT/10ML Liquid, Oral) Active. Multivitamins (Oral) Active. Proventil HFA (108 (90 Base)MCG/ACT Aerosol Soln, Inhalation) Active. EPINEPHrine (0.3MG/0.3ML Device, Injection) Active. Vitamin B1 (100MG Tablet, Oral) Active. Medications Reconciled  Vitals Claiborne Billings Dockery LPN; 57/84/6962 95:28 AM) 09/16/2017 11:41 AM Weight: 177.5 lb Height: 67in Body Surface Area: 1.92 m Body Mass Index: 27.8 kg/m  Temp.: 98.27F(Temporal)  Pulse: 78 (Regular)  BP: 114/76 (Sitting, Left Arm, Standard)  Physical Exam  General: Alert AA female in no distress and does not appear ill Skin: Normal turgor, no rash Lungs: Clear to respirations Abdomen: Mild epigastric tenderness Extremities: PICC line in place right arm  Assessment & Plan   1.  HISTORY OF GASTRIC BYPASS (U13.24) - 12/13/2016  This was a revision from a sleeve gastrectomy  2.  ACUTE MARGINAL ULCER (K28.3)  Impression: Marginal ulcer post-revision of sleeve gastrectomy to gastric bypass. Intolerance of most solid foods. She is able to get her Carafate and Protonix down. Still describes significant symptoms of nausea and pain. However getting a little more food down with peanut butter and protein powder. Status post recent hysterectomy.   Weight is currently 177 with BMI of 28, which is actually up 10 pounds from her low in September of this year. I discussed with her that I would not recommend revisional surgery until we have fully maximized any attempted medical management and I think that would be after the first of the year. Continue Carafate and Protonix. She does admit to depression and I'm not sure that this is not working into her GI symptoms. I will discuss with Dr. Tamala Julian possible antidepressant or psychiatric referral. We will recheck lab work at her next visit to the hydration clinic.  Current Plans  Upper endoscopy   3. Depression 4. Chronic joint  5. Ulcerative colitis 6.  Low Vit D  10.5 on 09/29/2017 7.  Anemic  Hgb - 8.2 - 09/29/2017 (with low ferritin)   Alphonsa Overall, MD, [facility] Surgery Pager: [phone] Office phone:  [phone]

## 2017-10-06 NOTE — Discharge Instructions (Signed)
Patient received infusion of sodium chloride 0.9% 1,000 mL with thiamine 092 mg, folic acid 1 mg, multivitamins adult 10 mL infusion (Banana Bag). PICC line dressing changed and PICC flushed.

## 2017-10-06 NOTE — Progress Notes (Addendum)
PATIENT CARE CENTER NOTE  Diagnosis: Dehydration    Provider: Dr. Excell Seltzer   Procedure: Banana bag infusion    Note: Patient received infusion of Sodium Cloride 0.9%, 1,000 mL with thiamine 388 mg, folic acid 1 mg and multivitamins adult 10 mL (Banana Bag) with no complications. PICC line flushed and dressing changed. Patient given discharge instructions. Patient alert, oriented and ambulatory at discharge.

## 2017-10-07 ENCOUNTER — Ambulatory Visit (HOSPITAL_COMMUNITY): Payer: 59 | Admitting: Certified Registered Nurse Anesthetist

## 2017-10-07 ENCOUNTER — Encounter (HOSPITAL_COMMUNITY): Payer: Self-pay

## 2017-10-07 ENCOUNTER — Encounter (HOSPITAL_COMMUNITY)
Admission: RE | Disposition: A | Discharge status: Home / Self Care | Payer: Self-pay | Source: Ambulatory Visit | Attending: Surgery

## 2017-10-07 ENCOUNTER — Other Ambulatory Visit: Payer: Self-pay

## 2017-10-07 ENCOUNTER — Ambulatory Visit (HOSPITAL_COMMUNITY)
Admission: RE | Admit: 2017-10-07 | Discharge: 2017-10-07 | Disposition: A | Discharge status: Home / Self Care | Payer: 59 | Source: Ambulatory Visit | Attending: Surgery | Admitting: Surgery

## 2017-10-07 DIAGNOSIS — K283 Acute gastrojejunal ulcer without hemorrhage or perforation: Secondary | ICD-10-CM | POA: Diagnosis not present

## 2017-10-07 DIAGNOSIS — F419 Anxiety disorder, unspecified: Secondary | ICD-10-CM | POA: Diagnosis not present

## 2017-10-07 DIAGNOSIS — F329 Major depressive disorder, single episode, unspecified: Secondary | ICD-10-CM | POA: Diagnosis not present

## 2017-10-07 DIAGNOSIS — Z880 Allergy status to penicillin: Secondary | ICD-10-CM | POA: Diagnosis not present

## 2017-10-07 DIAGNOSIS — K449 Diaphragmatic hernia without obstruction or gangrene: Secondary | ICD-10-CM | POA: Diagnosis not present

## 2017-10-07 DIAGNOSIS — Z9884 Bariatric surgery status: Secondary | ICD-10-CM | POA: Diagnosis not present

## 2017-10-07 DIAGNOSIS — Z90710 Acquired absence of both cervix and uterus: Secondary | ICD-10-CM | POA: Diagnosis not present

## 2017-10-07 DIAGNOSIS — Z881 Allergy status to other antibiotic agents status: Secondary | ICD-10-CM | POA: Insufficient documentation

## 2017-10-07 DIAGNOSIS — Z79899 Other long term (current) drug therapy: Secondary | ICD-10-CM | POA: Insufficient documentation

## 2017-10-07 DIAGNOSIS — Z934 Other artificial openings of gastrointestinal tract status: Secondary | ICD-10-CM | POA: Insufficient documentation

## 2017-10-07 DIAGNOSIS — Z87891 Personal history of nicotine dependence: Secondary | ICD-10-CM | POA: Diagnosis not present

## 2017-10-07 DIAGNOSIS — D649 Anemia, unspecified: Secondary | ICD-10-CM | POA: Diagnosis not present

## 2017-10-07 DIAGNOSIS — Z888 Allergy status to other drugs, medicaments and biological substances status: Secondary | ICD-10-CM | POA: Diagnosis not present

## 2017-10-07 DIAGNOSIS — K5190 Ulcerative colitis, unspecified, without complications: Secondary | ICD-10-CM | POA: Diagnosis not present

## 2017-10-07 DIAGNOSIS — K644 Residual hemorrhoidal skin tags: Secondary | ICD-10-CM | POA: Diagnosis not present

## 2017-10-07 DIAGNOSIS — Z8711 Personal history of peptic ulcer disease: Secondary | ICD-10-CM | POA: Diagnosis not present

## 2017-10-07 DIAGNOSIS — K289 Gastrojejunal ulcer, unspecified as acute or chronic, without hemorrhage or perforation: Secondary | ICD-10-CM | POA: Diagnosis not present

## 2017-10-07 DIAGNOSIS — K219 Gastro-esophageal reflux disease without esophagitis: Secondary | ICD-10-CM | POA: Insufficient documentation

## 2017-10-07 DIAGNOSIS — J45909 Unspecified asthma, uncomplicated: Secondary | ICD-10-CM | POA: Insufficient documentation

## 2017-10-07 DIAGNOSIS — D509 Iron deficiency anemia, unspecified: Secondary | ICD-10-CM | POA: Diagnosis not present

## 2017-10-07 DIAGNOSIS — M1990 Unspecified osteoarthritis, unspecified site: Secondary | ICD-10-CM | POA: Diagnosis not present

## 2017-10-07 DIAGNOSIS — R110 Nausea: Secondary | ICD-10-CM | POA: Diagnosis present

## 2017-10-07 DIAGNOSIS — R1010 Upper abdominal pain, unspecified: Secondary | ICD-10-CM | POA: Diagnosis not present

## 2017-10-07 HISTORY — PX: ESOPHAGOGASTRODUODENOSCOPY (EGD) WITH PROPOFOL: SHX5813

## 2017-10-07 SURGERY — ESOPHAGOGASTRODUODENOSCOPY (EGD) WITH PROPOFOL
Anesthesia: Monitor Anesthesia Care

## 2017-10-07 MED ORDER — PROPOFOL 10 MG/ML IV BOLUS
INTRAVENOUS | Status: AC
  Filled 2017-10-07: qty 60

## 2017-10-07 MED ORDER — SCOPOLAMINE 1 MG/3DAYS TD PT72
MEDICATED_PATCH | TRANSDERMAL | Status: DC | PRN
  Administered 2017-10-07: 1 via TRANSDERMAL

## 2017-10-07 MED ORDER — DEXAMETHASONE SODIUM PHOSPHATE 10 MG/ML IJ SOLN
INTRAMUSCULAR | Status: DC | PRN
  Administered 2017-10-07: 10 mg via INTRAVENOUS

## 2017-10-07 MED ORDER — HEPARIN SOD (PORK) LOCK FLUSH 100 UNIT/ML IV SOLN
250.0000 [IU] | INTRAVENOUS | Status: AC | PRN
  Administered 2017-10-07: 250 [IU]

## 2017-10-07 MED ORDER — PROPOFOL 500 MG/50ML IV EMUL
INTRAVENOUS | Status: DC | PRN
  Administered 2017-10-07: 75 ug/kg/min via INTRAVENOUS

## 2017-10-07 MED ORDER — ONDANSETRON HCL 4 MG/2ML IJ SOLN
INTRAMUSCULAR | Status: DC | PRN
  Administered 2017-10-07: 4 mg via INTRAVENOUS

## 2017-10-07 MED ORDER — LIDOCAINE 2% (20 MG/ML) 5 ML SYRINGE
INTRAMUSCULAR | Status: DC | PRN
  Administered 2017-10-07: 100 mg via INTRAVENOUS

## 2017-10-07 MED ORDER — PROPOFOL 10 MG/ML IV BOLUS
INTRAVENOUS | Status: DC | PRN
  Administered 2017-10-07: 30 mg via INTRAVENOUS
  Administered 2017-10-07 (×2): 10 mg via INTRAVENOUS
  Administered 2017-10-07 (×2): 20 mg via INTRAVENOUS

## 2017-10-07 MED ORDER — SCOPOLAMINE 1 MG/3DAYS TD PT72
MEDICATED_PATCH | TRANSDERMAL | Status: AC
  Filled 2017-10-07: qty 1

## 2017-10-07 MED ORDER — SODIUM CHLORIDE 0.9 % IV SOLN: INTRAVENOUS | Status: DC

## 2017-10-07 MED ORDER — LACTATED RINGERS IV SOLN
INTRAVENOUS | Status: DC | PRN
  Administered 2017-10-07: 09:00:00 via INTRAVENOUS

## 2017-10-07 SURGICAL SUPPLY — 15 items

## 2017-10-07 NOTE — Anesthesia Preprocedure Evaluation (Signed)
Anesthesia Evaluation  Patient identified by MRN, date of birth, ID band Patient awake    Reviewed: Allergy & Precautions, H&P , NPO status , Patient's Chart, lab work & pertinent test results  History of Anesthesia Complications (+) PONV and history of anesthetic complications  Airway Mallampati: II   Neck ROM: full    Dental   Pulmonary asthma , former smoker,    breath sounds clear to auscultation       Cardiovascular negative cardio ROS   Rhythm:regular Rate:Normal     Neuro/Psych  Headaches, PSYCHIATRIC DISORDERS Anxiety Depression  Neuromuscular disease    GI/Hepatic hiatal hernia, PUD, GERD  ,  Endo/Other    Renal/GU      Musculoskeletal  (+) Arthritis ,   Abdominal   Peds  Hematology  (+) Blood dyscrasia, anemia ,   Anesthesia Other Findings   Reproductive/Obstetrics                             Anesthesia Physical Anesthesia Plan  ASA: II  Anesthesia Plan: MAC   Post-op Pain Management:    Induction: Intravenous  PONV Risk Score and Plan: 3 and Ondansetron, Propofol infusion and Treatment may vary due to age or medical condition  Airway Management Planned: Nasal Cannula  Additional Equipment:   Intra-op Plan:   Post-operative Plan:   Informed Consent: I have reviewed the patients History and Physical, chart, labs and discussed the procedure including the risks, benefits and alternatives for the proposed anesthesia with the patient or authorized representative who has indicated his/her understanding and acceptance.     Plan Discussed with: CRNA, Anesthesiologist and Surgeon  Anesthesia Plan Comments:         Anesthesia Quick Evaluation

## 2017-10-07 NOTE — Op Note (Signed)
10/07/2017  9:04 AM  PATIENT:  [name], 43 y.o., female, MRN: [id number]  PREOP DIAGNOSIS:  History of RYGB, pain and nausea  POSTOP DIAGNOSIS:   Marginal ulcer at gastrojejunostomy - on jejunum (about 1.0 cm in size), history of RYGB  PROCEDURE:  Esophagogastrojejunoscopy [photos in chart]  SURGEON:   Alphonsa Overall, M.D.  ANESTHESIA:   Propofol by anesthesia  INDICATIONS FOR PROCEDURE:  [name] is a 42 y.o. (DOB: 1975-12-15)  AA female whose primary care physician is Wardell Honour, MD and comes for upper endoscopy to evaluate nausea and abdominal pain.  The patient had a sleeve gastrecotmy (01/14/2016), converted to a a RYGB on 12/14/2016 by Dr. Excell Seltzer.   The indications and risks of the endoscopy were explained to the patient.  The risks include, but are not limited to, perforation, bleeding, or injury to the bowel.  If balloon dilatation is needed, the risk of perforation is higher.  PROCEDURE:  The patient was in room 4 at [facility] endoscopy unit.  The patient was monitored with a pulse oximetry, BP cuff, and EKG.  The patient has nasal O2 flowing during the procedure.   A time was held prior to the procedure.   Propofol anesthesia was provided by Adair Laundry, CRNA.  Findings include:   Esophagus:   Normal   GE junction at:  38 cm.  Possible small (2 cm) hiatal hernia.   Stomach pouch: Normal - though somewhat long   Gastrojejunal anastomosis:   47 cm.  There is one 1.0 cm superficial ulcer on the jejunal side of the GJ junction   Efferent jejunal limb:  30 cm and normal Afferent jejunal limb:  5 cm and blind pouch, no ulcer or inflammation   CLO test:  Yes  PLAN:   Photos taken and given to patient.   Continue Protonix and carafate  Alphonsa Overall, MD, [facility] Surgery Pager: [phone] Office phone:  [phone]

## 2017-10-07 NOTE — Interval H&P Note (Signed)
History and Physical Interval Note:  10/07/2017 8:28 AM  [name]  has presented today for surgery, with the diagnosis of History of gastric bypass and marginal ulcers  The various methods of treatment have been discussed with the patient and family.   Mother, Virtel, at the bedside.  After consideration of risks, benefits and other options for treatment, the patient has consented to  Procedure(s): ESOPHAGOGASTRODUODENOSCOPY (EGD) WITH PROPOFOL (N/A) as a surgical intervention .  The patient's history has been reviewed, patient examined, no change in status, stable for surgery.  I have reviewed the patient's chart and labs.  Questions were answered to the patient's satisfaction.     Shann Medal

## 2017-10-07 NOTE — Transfer of Care (Signed)
Immediate Anesthesia Transfer of Care Note  Patient: [name]  Procedure(s) Performed: ESOPHAGOGASTRODUODENOSCOPY (EGD) WITH PROPOFOL (N/A )  Patient Location: Endoscopy Unit  Anesthesia Type:MAC  Level of Consciousness: drowsy and patient cooperative  Airway & Oxygen Therapy: Patient Spontanous Breathing and Patient connected to face mask  Post-op Assessment: Report given to RN and Post -op Vital signs reviewed and stable  Post vital signs: Reviewed and stable  Last Vitals:  Vitals:   10/07/17 0752  BP: 124/77  Pulse: 63  Resp: 13  Temp: 36.9 C  SpO2: 100%    Last Pain:  Vitals:   10/07/17 0752  TempSrc: Oral  PainSc: 8          Complications: No apparent anesthesia complications

## 2017-10-07 NOTE — Anesthesia Postprocedure Evaluation (Signed)
Anesthesia Post Note  Patient: [name]  Procedure(s) Performed: ESOPHAGOGASTRODUODENOSCOPY (EGD) WITH PROPOFOL (N/A )     Patient location during evaluation: PACU Anesthesia Type: MAC Level of consciousness: awake and alert Pain management: pain level controlled Vital Signs Assessment: post-procedure vital signs reviewed and stable Respiratory status: spontaneous breathing, nonlabored ventilation, respiratory function stable and patient connected to nasal cannula oxygen Cardiovascular status: stable and blood pressure returned to baseline Postop Assessment: no apparent nausea or vomiting Anesthetic complications: no    Last Vitals:  Vitals:   10/07/17 0930 10/07/17 0935  BP: 125/65   Pulse: (!) 58 67  Resp: 15 (!) 26  Temp:    SpO2: 100% 100%    Last Pain:  Vitals:   10/07/17 0905  TempSrc: Oral  PainSc:                  [place]

## 2017-10-08 ENCOUNTER — Ambulatory Visit: Payer: Self-pay | Admitting: General Surgery

## 2017-10-08 ENCOUNTER — Encounter (HOSPITAL_COMMUNITY): Payer: Self-pay | Admitting: Surgery

## 2017-10-08 LAB — CLOTEST (H. PYLORI), BIOPSY: HELICOBACTER SCREEN: NEGATIVE

## 2017-10-13 ENCOUNTER — Ambulatory Visit (HOSPITAL_COMMUNITY)
Admission: RE | Admit: 2017-10-13 | Discharge: 2017-10-13 | Disposition: A | Discharge status: Home / Self Care | Payer: 59 | Source: Ambulatory Visit | Attending: General Surgery | Admitting: General Surgery

## 2017-10-13 DIAGNOSIS — K289 Gastrojejunal ulcer, unspecified as acute or chronic, without hemorrhage or perforation: Secondary | ICD-10-CM | POA: Diagnosis not present

## 2017-10-13 MED ORDER — HEPARIN SOD (PORK) LOCK FLUSH 100 UNIT/ML IV SOLN
250.0000 [IU] | INTRAVENOUS | Status: AC | PRN
  Administered 2017-10-13: 250 [IU]
  Filled 2017-10-13: qty 5

## 2017-10-13 MED ORDER — ONDANSETRON 4 MG PO TBDP
4.0000 mg | ORAL_TABLET | Freq: Once | ORAL | Status: AC
Start: 2017-10-13 — End: 2017-10-13
  Administered 2017-10-13: 4 mg via ORAL
  Filled 2017-10-13: qty 1

## 2017-10-13 MED ORDER — SODIUM CHLORIDE 0.9% FLUSH
10.0000 mL | INTRAVENOUS | Status: AC | PRN
  Administered 2017-10-13: 10 mL

## 2017-10-13 MED ORDER — THIAMINE HCL 100 MG/ML IJ SOLN
Freq: Once | INTRAVENOUS | Status: AC
  Administered 2017-10-13: 10:00:00 via INTRAVENOUS
  Filled 2017-10-13: qty 1000

## 2017-10-13 NOTE — Discharge Instructions (Signed)
Patient received infusion of sodium chloride 0.9%, 1,000 mL with thiamine 656 mg, folic acid 1 mg, multivitamins adult 10 mL (Banana Bag) and PICC dressing changed.

## 2017-10-13 NOTE — Progress Notes (Signed)
PATIENT CARE CENTER NOTE  Diagnosis:Dehydration   Provider:Dr. Excell Seltzer   Procedure:Banana bag infusion    Note:Patient received infusion of Sodium Cloride 0.9%, 1,000 mL with thiamine 990 mg, folic acid 1 mg and multivitamins adult 10 mL (Banana Bag) with no complications. PICC line flushed and dressing changed. Patient given discharge instructions. Patient alert, oriented and ambulatory at discharge.

## 2017-10-14 ENCOUNTER — Ambulatory Visit: Payer: Self-pay | Admitting: General Surgery

## 2017-10-15 NOTE — Progress Notes (Signed)
CHEST XRAY 06-14-17 Epic EKG 09-22-17 Epic

## 2017-10-15 NOTE — Patient Instructions (Signed)
[name]  10/15/2017   Your procedure is scheduled on: 10-25-17  Report to [facility] Main  Entrance  Follow signs to Short Stay on first floor at 530 AM  Call this number if you have problems the morning of surgery [phone]   Remember: Do not eat food or drink liquids :After Midnight.     Take these medicines the morning of surgery with A SIP OF WATER: HYDROCODONE IF NEEDED, ALBUTEROL INHALER IF NEEDED AND BRING INHALER, FLUOXETINE (PROZAC), PANTAPRAZOLE (PROTONIX)                               You may not have any metal on your body including hair pins and              piercings  Do not wear jewelry, make-up, lotions, powders or perfumes, deodorant             Do not wear nail polish.  Do not shave  48 hours prior to surgery.              Men may shave face and neck.   Do not bring valuables to the hospital. [name].  Contacts, dentures or bridgework may not be worn into surgery.  Leave suitcase in the car. After surgery it may be brought to your room.                 Please read over the following fact sheets you were given: _____________________________________________________________________             [facility] - Preparing for Surgery Before surgery, you can play an important role.  Because skin is not sterile, your skin needs to be as free of germs as possible.  You can reduce the number of germs on your skin by washing with CHG (chlorahexidine gluconate) soap before surgery.  CHG is an antiseptic cleaner which kills germs and bonds with the skin to continue killing germs even after washing. Please DO NOT use if you have an allergy to CHG or antibacterial soaps.  If your skin becomes reddened/irritated stop using the CHG and inform your nurse when you arrive at Short Stay. Do not shave (including legs and underarms) for at least 48 hours prior to the first CHG shower.  You may shave your  face/neck. Please follow these instructions carefully:  1.  Shower with CHG Soap the night before surgery and the  morning of Surgery.  2.  If you choose to wash your hair, wash your hair first as usual with your  normal  shampoo.  3.  After you shampoo, rinse your hair and body thoroughly to remove the  shampoo.                           4.  Use CHG as you would any other liquid soap.  You can apply chg directly  to the skin and wash                       Gently with a scrungie or clean washcloth.  5.  Apply the CHG Soap to your body ONLY FROM THE NECK DOWN.   Do  not use on face/ open                           Wound or open sores. Avoid contact with eyes, ears mouth and genitals (private parts).                       Wash face,  Genitals (private parts) with your normal soap.             6.  Wash thoroughly, paying special attention to the area where your surgery  will be performed.  7.  Thoroughly rinse your body with warm water from the neck down.  8.  DO NOT shower/wash with your normal soap after using and rinsing off  the CHG Soap.                9.  Pat yourself dry with a clean towel.            10.  Wear clean pajamas.            11.  Place clean sheets on your bed the night of your first shower and do not  sleep with pets. Day of Surgery : Do not apply any lotions/deodorants the morning of surgery.  Please wear clean clothes to the hospital/surgery center.  FAILURE TO FOLLOW THESE INSTRUCTIONS MAY RESULT IN THE CANCELLATION OF YOUR SURGERY PATIENT SIGNATURE_________________________________  NURSE SIGNATURE__________________________________  ________________________________________________________________________   [name]  An incentive spirometer is a tool that can help keep your lungs clear and active. This tool measures how well you are filling your lungs with each breath. Taking long deep breaths may help reverse or decrease the chance of developing breathing  (pulmonary) problems (especially infection) following:  A long period of time when you are unable to move or be active. BEFORE THE PROCEDURE   If the spirometer includes an indicator to show your best effort, your nurse or respiratory therapist will set it to a desired goal.  If possible, sit up straight or lean slightly forward. Try not to slouch.  Hold the incentive spirometer in an upright position. INSTRUCTIONS FOR USE  1. Sit on the edge of your bed if possible, or sit up as far as you can in bed or on a chair. 2. Hold the incentive spirometer in an upright position. 3. Breathe out normally. 4. Place the mouthpiece in your mouth and seal your lips tightly around it. 5. Breathe in slowly and as deeply as possible, raising the piston or the ball toward the top of the column. 6. Hold your breath for 3-5 seconds or for as long as possible. Allow the piston or ball to fall to the bottom of the column. 7. Remove the mouthpiece from your mouth and breathe out normally. 8. Rest for a few seconds and repeat Steps 1 through 7 at least 10 times every 1-2 hours when you are awake. Take your time and take a few normal breaths between deep breaths. 9. The spirometer may include an indicator to show your best effort. Use the indicator as a goal to work toward during each repetition. 10. After each set of 10 deep breaths, practice coughing to be sure your lungs are clear. If you have an incision (the cut made at the time of surgery), support your incision when coughing by placing a pillow or rolled up towels firmly against it. Once you are able to get out of bed,  walk around indoors and cough well. You may stop using the incentive spirometer when instructed by your caregiver.  RISKS AND COMPLICATIONS  Take your time so you do not get dizzy or light-headed.  If you are in pain, you may need to take or ask for pain medication before doing incentive spirometry. It is harder to take a deep breath if you  are having pain. AFTER USE  Rest and breathe slowly and easily.  It can be helpful to keep track of a log of your progress. Your caregiver can provide you with a simple table to help with this. If you are using the spirometer at home, follow these instructions: [place] IF:   You are having difficultly using the spirometer.  You have trouble using the spirometer as often as instructed.  Your pain medication is not giving enough relief while using the spirometer.  You develop fever of 100.5 F (38.1 C) or higher. SEEK IMMEDIATE MEDICAL CARE IF:   You cough up bloody sputum that had not been present before.  You develop fever of 102 F (38.9 C) or greater.  You develop worsening pain at or near the incision site. MAKE SURE YOU:   Understand these instructions.  Will watch your condition.  Will get help right away if you are not doing well or get worse. Document Released: 01/25/2007 Document Revised: 12/07/2011 Document Reviewed: 03/28/2007 ExitCare Patient Information 2014 [employer].  TURN, COUGH AND DEEP BREATHE FREQUENTLY AFTER SURGERY TO PREVENT PNEUMONIA! ________________________________________________________________________

## 2017-10-18 ENCOUNTER — Encounter (HOSPITAL_COMMUNITY): Payer: Self-pay

## 2017-10-18 ENCOUNTER — Other Ambulatory Visit: Payer: Self-pay

## 2017-10-18 ENCOUNTER — Encounter (HOSPITAL_COMMUNITY)
Admission: RE | Admit: 2017-10-18 | Discharge: 2017-10-18 | Disposition: A | Discharge status: Home / Self Care | Payer: 59 | Source: Ambulatory Visit | Attending: General Surgery | Admitting: General Surgery

## 2017-10-18 DIAGNOSIS — K289 Gastrojejunal ulcer, unspecified as acute or chronic, without hemorrhage or perforation: Secondary | ICD-10-CM | POA: Diagnosis not present

## 2017-10-18 DIAGNOSIS — Z01812 Encounter for preprocedural laboratory examination: Secondary | ICD-10-CM | POA: Insufficient documentation

## 2017-10-18 HISTORY — DX: Paresthesia of skin: R20.2

## 2017-10-18 LAB — CBC WITH DIFFERENTIAL/PLATELET
BASOS ABS: 0 10*3/uL (ref 0.0–0.1)
BASOS PCT: 1 %
Eosinophils Absolute: 0.4 10*3/uL (ref 0.0–0.7)
Eosinophils Relative: 5 %
HCT: 29.6 % — ABNORMAL LOW (ref 36.0–46.0)
Hemoglobin: 8.8 g/dL — ABNORMAL LOW (ref 12.0–15.0)
Lymphocytes Relative: 48 %
Lymphs Abs: 3.7 10*3/uL (ref 0.7–4.0)
MCH: 22.3 pg — ABNORMAL LOW (ref 26.0–34.0)
MCHC: 29.7 g/dL — ABNORMAL LOW (ref 30.0–36.0)
MCV: 75.1 fL — AB (ref 78.0–100.0)
Monocytes Absolute: 0.3 10*3/uL (ref 0.1–1.0)
Monocytes Relative: 4 %
NEUTROS ABS: 3.2 10*3/uL (ref 1.7–7.7)
Neutrophils Relative %: 42 %
PLATELETS: 343 10*3/uL (ref 150–400)
RBC: 3.94 MIL/uL (ref 3.87–5.11)
RDW: 16.3 % — ABNORMAL HIGH (ref 11.5–15.5)
WBC: 7.6 10*3/uL (ref 4.0–10.5)

## 2017-10-18 LAB — COMPREHENSIVE METABOLIC PANEL
ALBUMIN: 4.2 g/dL (ref 3.5–5.0)
ALT: 16 U/L (ref 14–54)
AST: 22 U/L (ref 15–41)
Alkaline Phosphatase: 54 U/L (ref 38–126)
Anion gap: 4 — ABNORMAL LOW (ref 5–15)
BILIRUBIN TOTAL: 0.7 mg/dL (ref 0.3–1.2)
BUN: 16 mg/dL (ref 6–20)
CHLORIDE: 111 mmol/L (ref 101–111)
CO2: 22 mmol/L (ref 22–32)
CREATININE: 0.75 mg/dL (ref 0.44–1.00)
Calcium: 9 mg/dL (ref 8.9–10.3)
GFR calc Af Amer: >60 mL/min (ref >60)
Glucose, Bld: 80 mg/dL (ref 65–99)
POTASSIUM: 4.1 mmol/L (ref 3.5–5.1)
Sodium: 137 mmol/L (ref 135–145)
Total Protein: 8.2 g/dL — ABNORMAL HIGH (ref 6.5–8.1)

## 2017-10-19 NOTE — Progress Notes (Signed)
CBC WITH DIF RESULTS ROUTED TO DR Fosston

## 2017-10-20 ENCOUNTER — Ambulatory Visit (HOSPITAL_COMMUNITY)
Admission: RE | Admit: 2017-10-20 | Discharge: 2017-10-20 | Disposition: A | Discharge status: Home / Self Care | Payer: 59 | Source: Ambulatory Visit | Attending: General Surgery | Admitting: General Surgery

## 2017-10-20 DIAGNOSIS — K289 Gastrojejunal ulcer, unspecified as acute or chronic, without hemorrhage or perforation: Secondary | ICD-10-CM | POA: Diagnosis not present

## 2017-10-20 MED ORDER — HEPARIN SOD (PORK) LOCK FLUSH 100 UNIT/ML IV SOLN
250.0000 [IU] | INTRAVENOUS | Status: AC | PRN
  Administered 2017-10-20: 250 [IU]

## 2017-10-20 MED ORDER — SODIUM CHLORIDE 0.9% FLUSH
10.0000 mL | INTRAVENOUS | Status: AC | PRN
  Administered 2017-10-20: 10 mL

## 2017-10-20 MED ORDER — THIAMINE HCL 100 MG/ML IJ SOLN
Freq: Once | INTRAVENOUS | Status: AC
  Administered 2017-10-20: 11:00:00 via INTRAVENOUS
  Filled 2017-10-20: qty 1000

## 2017-10-20 MED ORDER — ONDANSETRON 4 MG PO TBDP
4.0000 mg | ORAL_TABLET | Freq: Once | ORAL | Status: AC
  Administered 2017-10-20: 4 mg via ORAL
  Filled 2017-10-20: qty 1

## 2017-10-20 NOTE — Progress Notes (Signed)
PATIENT CARE CENTER NOTE  Diagnosis:Dehydration   Provider:Dr. Excell Seltzer   Procedure:Banana bag infusion    Note:Patient received infusion of Sodium Cloride 0.9%, 1,000 mL with thiamine 470 mg, folic acid 1 mg and multivitamins adult 10 mL (Banana Bag) with no complications. PICC line flushed and dressing changed. Patient given discharge instructions. Patient alert, oriented and ambulatory at discharge.

## 2017-10-24 NOTE — Anesthesia Preprocedure Evaluation (Addendum)
Anesthesia Evaluation  Patient identified by MRN, date of birth, ID band Patient awake    Reviewed: Allergy & Precautions, NPO status , Patient's Chart, lab work & pertinent test results  History of Anesthesia Complications (+) PONV  Airway Mallampati: I  TM Distance: >3 FB Neck ROM: Full    Dental  (+) Dental Advisory Given   Pulmonary asthma , former smoker,    breath sounds clear to auscultation       Cardiovascular (-) anginanegative cardio ROS   Rhythm:Regular Rate:Normal     Neuro/Psych  Headaches, Anxiety Depression    GI/Hepatic Neg liver ROS, GERD  Medicated and Poorly Controlled,S/p Roux-en-Y gastric bypass   Endo/Other  negative endocrine ROS  Renal/GU negative Renal ROS     Musculoskeletal  (+) Arthritis ,   Abdominal   Peds  Hematology  (+) Blood dyscrasia (Hb 8.8), anemia ,   Anesthesia Other Findings   Reproductive/Obstetrics                            Anesthesia Physical Anesthesia Plan  ASA: II  Anesthesia Plan: General   Post-op Pain Management:    Induction: Intravenous  PONV Risk Score and Plan: 4 or greater and Ondansetron, Dexamethasone, Scopolamine patch - Pre-op and Diphenhydramine  Airway Management Planned: Oral ETT  Additional Equipment:   Intra-op Plan:   Post-operative Plan: Extubation in OR  Informed Consent: I have reviewed the patients History and Physical, chart, labs and discussed the procedure including the risks, benefits and alternatives for the proposed anesthesia with the patient or authorized representative who has indicated his/her understanding and acceptance.   Dental advisory given  Plan Discussed with: CRNA and Surgeon  Anesthesia Plan Comments: (Plan routine monitors, GETA)        Anesthesia Quick Evaluation

## 2017-10-25 ENCOUNTER — Encounter (HOSPITAL_COMMUNITY)
Admission: RE | Disposition: A | Discharge status: Home / Self Care | Payer: Self-pay | Source: Ambulatory Visit | Attending: General Surgery

## 2017-10-25 ENCOUNTER — Inpatient Hospital Stay (HOSPITAL_COMMUNITY): Payer: 59 | Admitting: Anesthesiology

## 2017-10-25 ENCOUNTER — Other Ambulatory Visit: Payer: Self-pay

## 2017-10-25 ENCOUNTER — Inpatient Hospital Stay (HOSPITAL_COMMUNITY)
Admission: RE | Admit: 2017-10-25 | Discharge: 2017-10-27 | DRG: 327 | Disposition: A | Discharge status: Home / Self Care | Payer: 59 | Source: Ambulatory Visit | Attending: General Surgery | Admitting: General Surgery

## 2017-10-25 ENCOUNTER — Encounter (HOSPITAL_COMMUNITY): Payer: Self-pay | Admitting: *Deleted

## 2017-10-25 DIAGNOSIS — Z885 Allergy status to narcotic agent status: Secondary | ICD-10-CM

## 2017-10-25 DIAGNOSIS — Z7951 Long term (current) use of inhaled steroids: Secondary | ICD-10-CM | POA: Diagnosis not present

## 2017-10-25 DIAGNOSIS — Z800 Family history of malignant neoplasm of digestive organs: Secondary | ICD-10-CM | POA: Diagnosis not present

## 2017-10-25 DIAGNOSIS — K9423 Gastrostomy malfunction: Secondary | ICD-10-CM | POA: Diagnosis not present

## 2017-10-25 DIAGNOSIS — Z8249 Family history of ischemic heart disease and other diseases of the circulatory system: Secondary | ICD-10-CM

## 2017-10-25 DIAGNOSIS — Z6827 Body mass index (BMI) 27.0-27.9, adult: Secondary | ICD-10-CM | POA: Diagnosis not present

## 2017-10-25 DIAGNOSIS — K9589 Other complications of other bariatric procedure: Principal | ICD-10-CM | POA: Diagnosis present

## 2017-10-25 DIAGNOSIS — M1990 Unspecified osteoarthritis, unspecified site: Secondary | ICD-10-CM | POA: Diagnosis present

## 2017-10-25 DIAGNOSIS — Z90710 Acquired absence of both cervix and uterus: Secondary | ICD-10-CM

## 2017-10-25 DIAGNOSIS — Z833 Family history of diabetes mellitus: Secondary | ICD-10-CM | POA: Diagnosis not present

## 2017-10-25 DIAGNOSIS — K5900 Constipation, unspecified: Secondary | ICD-10-CM | POA: Diagnosis present

## 2017-10-25 DIAGNOSIS — D62 Acute posthemorrhagic anemia: Secondary | ICD-10-CM | POA: Diagnosis not present

## 2017-10-25 DIAGNOSIS — Z880 Allergy status to penicillin: Secondary | ICD-10-CM | POA: Diagnosis not present

## 2017-10-25 DIAGNOSIS — Z881 Allergy status to other antibiotic agents status: Secondary | ICD-10-CM

## 2017-10-25 DIAGNOSIS — K253 Acute gastric ulcer without hemorrhage or perforation: Secondary | ICD-10-CM | POA: Diagnosis not present

## 2017-10-25 DIAGNOSIS — K289 Gastrojejunal ulcer, unspecified as acute or chronic, without hemorrhage or perforation: Secondary | ICD-10-CM | POA: Diagnosis not present

## 2017-10-25 DIAGNOSIS — Z8371 Family history of colonic polyps: Secondary | ICD-10-CM

## 2017-10-25 DIAGNOSIS — K283 Acute gastrojejunal ulcer without hemorrhage or perforation: Secondary | ICD-10-CM | POA: Diagnosis present

## 2017-10-25 DIAGNOSIS — K219 Gastro-esophageal reflux disease without esophagitis: Secondary | ICD-10-CM | POA: Diagnosis present

## 2017-10-25 DIAGNOSIS — F17200 Nicotine dependence, unspecified, uncomplicated: Secondary | ICD-10-CM | POA: Diagnosis present

## 2017-10-25 DIAGNOSIS — Z86010 Personal history of colonic polyps: Secondary | ICD-10-CM

## 2017-10-25 DIAGNOSIS — Y848 Other medical procedures as the cause of abnormal reaction of the patient, or of later complication, without mention of misadventure at the time of the procedure: Secondary | ICD-10-CM | POA: Diagnosis present

## 2017-10-25 DIAGNOSIS — K912 Postsurgical malabsorption, not elsewhere classified: Secondary | ICD-10-CM | POA: Diagnosis not present

## 2017-10-25 DIAGNOSIS — E46 Unspecified protein-calorie malnutrition: Secondary | ICD-10-CM | POA: Diagnosis not present

## 2017-10-25 DIAGNOSIS — M810 Age-related osteoporosis without current pathological fracture: Secondary | ICD-10-CM | POA: Diagnosis present

## 2017-10-25 DIAGNOSIS — Z8261 Family history of arthritis: Secondary | ICD-10-CM | POA: Diagnosis not present

## 2017-10-25 DIAGNOSIS — K5190 Ulcerative colitis, unspecified, without complications: Secondary | ICD-10-CM | POA: Diagnosis present

## 2017-10-25 DIAGNOSIS — R1013 Epigastric pain: Secondary | ICD-10-CM | POA: Diagnosis not present

## 2017-10-25 DIAGNOSIS — R112 Nausea with vomiting, unspecified: Secondary | ICD-10-CM | POA: Diagnosis not present

## 2017-10-25 HISTORY — PX: LAPAROSCOPIC REVISION OF GASTROJEJUNOSTOMY: SHX5922

## 2017-10-25 HISTORY — DX: Gastrojejunal ulcer, unspecified as acute or chronic, without hemorrhage or perforation: K28.9

## 2017-10-25 LAB — HEMOGLOBIN AND HEMATOCRIT, BLOOD
HCT: 26.8 % — ABNORMAL LOW (ref 36.0–46.0)
Hemoglobin: 8.2 g/dL — ABNORMAL LOW (ref 12.0–15.0)

## 2017-10-25 SURGERY — REVISION, ANASTOMOSIS, GASTROJEJUNAL, LAPAROSCOPIC
Anesthesia: General | Site: Abdomen

## 2017-10-25 MED ORDER — LACTATED RINGERS IV SOLN
INTRAVENOUS | Status: DC | PRN
  Administered 2017-10-25 (×2): via INTRAVENOUS

## 2017-10-25 MED ORDER — BUPIVACAINE HCL (PF) 0.25 % IJ SOLN
INTRAMUSCULAR | Status: AC
  Filled 2017-10-25: qty 30

## 2017-10-25 MED ORDER — SCOPOLAMINE 1 MG/3DAYS TD PT72
1.0000 | MEDICATED_PATCH | TRANSDERMAL | Status: DC
  Administered 2017-10-25: 1.5 mg via TRANSDERMAL
  Filled 2017-10-25: qty 1

## 2017-10-25 MED ORDER — DEXAMETHASONE SODIUM PHOSPHATE 4 MG/ML IJ SOLN
4.0000 mg | INTRAMUSCULAR | Status: AC
  Administered 2017-10-25: 4 mg via INTRAVENOUS

## 2017-10-25 MED ORDER — CEFAZOLIN SODIUM-DEXTROSE 2-3 GM-%(50ML) IV SOLR
2.0000 g | Freq: Once | INTRAVENOUS | Status: AC
  Administered 2017-10-25 (×2): 2 g via INTRAVENOUS

## 2017-10-25 MED ORDER — BUPIVACAINE LIPOSOME 1.3 % IJ SUSP
20.0000 mL | Freq: Once | INTRAMUSCULAR | Status: AC
  Administered 2017-10-25: 20 mL
  Filled 2017-10-25: qty 20

## 2017-10-25 MED ORDER — MEPERIDINE HCL 50 MG/ML IJ SOLN: 6.2500 mg | INTRAMUSCULAR | Status: DC | PRN

## 2017-10-25 MED ORDER — ONDANSETRON HCL 4 MG/2ML IJ SOLN
INTRAMUSCULAR | Status: AC
  Filled 2017-10-25: qty 2

## 2017-10-25 MED ORDER — EPHEDRINE 5 MG/ML INJ
INTRAVENOUS | Status: AC
  Filled 2017-10-25: qty 10

## 2017-10-25 MED ORDER — SUCCINYLCHOLINE CHLORIDE 200 MG/10ML IV SOSY
PREFILLED_SYRINGE | INTRAVENOUS | Status: DC | PRN
  Administered 2017-10-25: 100 mg via INTRAVENOUS

## 2017-10-25 MED ORDER — LACTATED RINGERS IR SOLN
Status: DC | PRN
  Administered 2017-10-25: 1000 mL

## 2017-10-25 MED ORDER — MIDAZOLAM HCL 5 MG/5ML IJ SOLN
INTRAMUSCULAR | Status: DC | PRN
  Administered 2017-10-25: 2 mg via INTRAVENOUS

## 2017-10-25 MED ORDER — PROMETHAZINE HCL 25 MG/ML IJ SOLN: 6.2500 mg | INTRAMUSCULAR | Status: DC | PRN

## 2017-10-25 MED ORDER — PROPOFOL 10 MG/ML IV BOLUS
INTRAVENOUS | Status: DC | PRN
  Administered 2017-10-25: 200 mg via INTRAVENOUS

## 2017-10-25 MED ORDER — EVICEL 5 ML EX KIT
PACK | Freq: Once | CUTANEOUS | Status: DC
  Filled 2017-10-25: qty 1

## 2017-10-25 MED ORDER — PHENYLEPHRINE 40 MCG/ML (10ML) SYRINGE FOR IV PUSH (FOR BLOOD PRESSURE SUPPORT)
PREFILLED_SYRINGE | INTRAVENOUS | Status: AC
Start: 2017-10-25 — End: 2017-10-25
  Filled 2017-10-25: qty 10

## 2017-10-25 MED ORDER — EPHEDRINE SULFATE-NACL 50-0.9 MG/10ML-% IV SOSY
PREFILLED_SYRINGE | INTRAVENOUS | Status: DC | PRN
  Administered 2017-10-25: 5 mg via INTRAVENOUS

## 2017-10-25 MED ORDER — MIDAZOLAM HCL 2 MG/2ML IJ SOLN
INTRAMUSCULAR | Status: AC
  Filled 2017-10-25: qty 2

## 2017-10-25 MED ORDER — ALBUTEROL SULFATE (2.5 MG/3ML) 0.083% IN NEBU: 3.0000 mL | INHALATION_SOLUTION | Freq: Four times a day (QID) | RESPIRATORY_TRACT | Status: DC | PRN

## 2017-10-25 MED ORDER — ROCURONIUM BROMIDE 50 MG/5ML IV SOSY
PREFILLED_SYRINGE | INTRAVENOUS | Status: AC
  Filled 2017-10-25: qty 5

## 2017-10-25 MED ORDER — HEPARIN SODIUM (PORCINE) 5000 UNIT/ML IJ SOLN
5000.0000 [IU] | INTRAMUSCULAR | Status: AC
  Administered 2017-10-25: 5000 [IU] via SUBCUTANEOUS
  Filled 2017-10-25: qty 1

## 2017-10-25 MED ORDER — PHENYLEPHRINE 40 MCG/ML (10ML) SYRINGE FOR IV PUSH (FOR BLOOD PRESSURE SUPPORT)
PREFILLED_SYRINGE | INTRAVENOUS | Status: AC
  Filled 2017-10-25: qty 10

## 2017-10-25 MED ORDER — CEFAZOLIN SODIUM-DEXTROSE 2-4 GM/100ML-% IV SOLN
INTRAVENOUS | Status: AC
  Filled 2017-10-25: qty 100

## 2017-10-25 MED ORDER — GABAPENTIN 300 MG PO CAPS
300.0000 mg | ORAL_CAPSULE | ORAL | Status: AC
  Administered 2017-10-25: 300 mg via ORAL
  Filled 2017-10-25: qty 1

## 2017-10-25 MED ORDER — HYDROMORPHONE HCL 1 MG/ML IJ SOLN
INTRAMUSCULAR | Status: AC
  Filled 2017-10-25: qty 1

## 2017-10-25 MED ORDER — CHLORHEXIDINE GLUCONATE CLOTH 2 % EX PADS: 6.0000 | MEDICATED_PAD | Freq: Once | CUTANEOUS | Status: DC

## 2017-10-25 MED ORDER — SODIUM CHLORIDE 0.9 % IJ SOLN
INTRAMUSCULAR | Status: DC | PRN
  Administered 2017-10-25: 50 mL

## 2017-10-25 MED ORDER — FENTANYL CITRATE (PF) 100 MCG/2ML IJ SOLN
INTRAMUSCULAR | Status: DC | PRN
  Administered 2017-10-25 (×2): 50 ug via INTRAVENOUS

## 2017-10-25 MED ORDER — LIDOCAINE 2% (20 MG/ML) 5 ML SYRINGE
INTRAMUSCULAR | Status: AC
  Filled 2017-10-25: qty 5

## 2017-10-25 MED ORDER — BUPIVACAINE-EPINEPHRINE (PF) 0.5% -1:200000 IJ SOLN
INTRAMUSCULAR | Status: AC
  Filled 2017-10-25: qty 30

## 2017-10-25 MED ORDER — 0.9 % SODIUM CHLORIDE (POUR BTL) OPTIME
TOPICAL | Status: DC | PRN
  Administered 2017-10-25: 1000 mL

## 2017-10-25 MED ORDER — PROPOFOL 10 MG/ML IV BOLUS
INTRAVENOUS | Status: AC
  Filled 2017-10-25: qty 20

## 2017-10-25 MED ORDER — HYDROMORPHONE HCL 1 MG/ML IJ SOLN
0.2500 mg | INTRAMUSCULAR | Status: DC | PRN
  Administered 2017-10-25 (×2): 0.5 mg via INTRAVENOUS

## 2017-10-25 MED ORDER — MIDAZOLAM HCL 2 MG/2ML IJ SOLN: 0.5000 mg | Freq: Once | INTRAMUSCULAR | Status: DC | PRN

## 2017-10-25 MED ORDER — EVICEL 5 ML EX KIT
PACK | CUTANEOUS | Status: DC | PRN
Start: 2017-10-25 — End: 2017-10-25
  Administered 2017-10-25: 5 mL

## 2017-10-25 MED ORDER — FENTANYL CITRATE (PF) 100 MCG/2ML IJ SOLN
25.0000 ug | INTRAMUSCULAR | Status: DC | PRN
  Administered 2017-10-25 – 2017-10-26 (×5): 25 ug via INTRAVENOUS
  Filled 2017-10-25 (×6): qty 2

## 2017-10-25 MED ORDER — DEXAMETHASONE SODIUM PHOSPHATE 10 MG/ML IJ SOLN
INTRAMUSCULAR | Status: DC | PRN
  Administered 2017-10-25: 6 mg via INTRAVENOUS

## 2017-10-25 MED ORDER — ENOXAPARIN SODIUM 30 MG/0.3ML ~~LOC~~ SOLN: 30.0000 mg | Freq: Two times a day (BID) | SUBCUTANEOUS | Status: DC

## 2017-10-25 MED ORDER — PHENYLEPHRINE 40 MCG/ML (10ML) SYRINGE FOR IV PUSH (FOR BLOOD PRESSURE SUPPORT)
PREFILLED_SYRINGE | INTRAVENOUS | Status: DC | PRN
  Administered 2017-10-25 (×2): 40 ug via INTRAVENOUS
  Administered 2017-10-25 (×4): 80 ug via INTRAVENOUS
  Administered 2017-10-25: 40 ug via INTRAVENOUS
  Administered 2017-10-25 (×4): 80 ug via INTRAVENOUS
  Administered 2017-10-25: 40 ug via INTRAVENOUS
  Administered 2017-10-25 (×5): 80 ug via INTRAVENOUS

## 2017-10-25 MED ORDER — SUGAMMADEX SODIUM 200 MG/2ML IV SOLN
INTRAVENOUS | Status: DC | PRN
  Administered 2017-10-25: 200 mg via INTRAVENOUS

## 2017-10-25 MED ORDER — DIPHENHYDRAMINE HCL 50 MG/ML IJ SOLN
INTRAMUSCULAR | Status: AC
  Filled 2017-10-25: qty 1

## 2017-10-25 MED ORDER — LEVOFLOXACIN IN D5W 750 MG/150ML IV SOLN: 750.0000 mg | INTRAVENOUS | Status: DC

## 2017-10-25 MED ORDER — KCL IN DEXTROSE-NACL 20-5-0.9 MEQ/L-%-% IV SOLN
INTRAVENOUS | Status: DC
  Administered 2017-10-25 – 2017-10-27 (×5): via INTRAVENOUS
  Filled 2017-10-25 (×6): qty 1000

## 2017-10-25 MED ORDER — SODIUM CHLORIDE 0.9 % IJ SOLN
INTRAMUSCULAR | Status: AC
  Filled 2017-10-25: qty 50

## 2017-10-25 MED ORDER — ONDANSETRON HCL 4 MG/2ML IJ SOLN
INTRAMUSCULAR | Status: DC | PRN
  Administered 2017-10-25 (×2): 4 mg via INTRAVENOUS

## 2017-10-25 MED ORDER — HYDROCODONE-ACETAMINOPHEN 7.5-325 MG/15ML PO SOLN
5.0000 mL | ORAL | Status: DC | PRN
  Administered 2017-10-26 – 2017-10-27 (×4): 10 mL via ORAL
  Filled 2017-10-25 (×5): qty 15

## 2017-10-25 MED ORDER — SUGAMMADEX SODIUM 200 MG/2ML IV SOLN
INTRAVENOUS | Status: AC
  Filled 2017-10-25: qty 2

## 2017-10-25 MED ORDER — DEXAMETHASONE SODIUM PHOSPHATE 10 MG/ML IJ SOLN
INTRAMUSCULAR | Status: AC
  Filled 2017-10-25: qty 1

## 2017-10-25 MED ORDER — DIPHENHYDRAMINE HCL 50 MG/ML IJ SOLN
INTRAMUSCULAR | Status: DC | PRN
  Administered 2017-10-25: 12.5 mg via INTRAVENOUS

## 2017-10-25 MED ORDER — PANTOPRAZOLE SODIUM 40 MG IV SOLR
40.0000 mg | Freq: Every day | INTRAVENOUS | Status: DC
  Administered 2017-10-25 – 2017-10-26 (×2): 40 mg via INTRAVENOUS
  Filled 2017-10-25 (×2): qty 40

## 2017-10-25 MED ORDER — ONDANSETRON HCL 4 MG/2ML IJ SOLN
4.0000 mg | INTRAMUSCULAR | Status: DC | PRN
  Administered 2017-10-25 – 2017-10-27 (×4): 4 mg via INTRAVENOUS
  Filled 2017-10-25 (×4): qty 2

## 2017-10-25 MED ORDER — BUPIVACAINE HCL (PF) 0.25 % IJ SOLN
INTRAMUSCULAR | Status: DC | PRN
  Administered 2017-10-25: 30 mL

## 2017-10-25 MED ORDER — PREMIER PROTEIN SHAKE
2.0000 [oz_av] | ORAL | Status: DC
  Administered 2017-10-27 (×3): 2 [oz_av] via ORAL

## 2017-10-25 MED ORDER — FENTANYL CITRATE (PF) 100 MCG/2ML IJ SOLN
INTRAMUSCULAR | Status: AC
  Filled 2017-10-25: qty 2

## 2017-10-25 MED ORDER — APREPITANT 40 MG PO CAPS
40.0000 mg | ORAL_CAPSULE | ORAL | Status: AC
  Administered 2017-10-25: 40 mg via ORAL
  Filled 2017-10-25: qty 1

## 2017-10-25 MED ORDER — ROCURONIUM BROMIDE 100 MG/10ML IV SOLN
INTRAVENOUS | Status: DC | PRN
  Administered 2017-10-25 (×4): 10 mg via INTRAVENOUS
  Administered 2017-10-25: 40 mg via INTRAVENOUS
  Administered 2017-10-25 (×3): 10 mg via INTRAVENOUS

## 2017-10-25 MED ORDER — SODIUM CHLORIDE 0.9% FLUSH
10.0000 mL | INTRAVENOUS | Status: DC | PRN
  Administered 2017-10-25 – 2017-10-26 (×2): 10 mL
  Administered 2017-10-27: 20 mL
  Filled 2017-10-25 (×3): qty 40

## 2017-10-25 MED ORDER — ACETAMINOPHEN 160 MG/5ML PO SOLN: 650.0000 mg | ORAL | Status: DC | PRN

## 2017-10-25 MED ORDER — ACETAMINOPHEN 500 MG PO TABS
1000.0000 mg | ORAL_TABLET | ORAL | Status: AC
  Administered 2017-10-25: 1000 mg via ORAL
  Filled 2017-10-25: qty 2

## 2017-10-25 SURGICAL SUPPLY — 85 items
ADH SKN CLS APL DERMABOND .7 (GAUZE/BANDAGES/DRESSINGS) ×2
APPLICATOR DUAL LIQUID (MISCELLANEOUS) IMPLANT
APPLIER CLIP ROT 10 11.4 M/L (STAPLE)
APR CLP MED LRG 11.4X10 (STAPLE)
BAG SPEC RTRVL 10 TROC 200 (ENDOMECHANICALS) ×4
CATH FOLEY 2WAY SLVR  5CC 22FR (CATHETERS) ×1
CATH FOLEY 2WAY SLVR 5CC 22FR (CATHETERS) ×1 IMPLANT
CHLORAPREP W/TINT 26ML (MISCELLANEOUS) ×3 IMPLANT
CLIP APPLIE ROT 10 11.4 M/L (STAPLE) IMPLANT
CLIP SUT LAPRA TY ABSORB (SUTURE) ×3 IMPLANT
COVER SURGICAL LIGHT HANDLE (MISCELLANEOUS) ×3 IMPLANT
CUTTER FLEX LINEAR 45M (STAPLE) ×4 IMPLANT
DERMABOND ADVANCED (GAUZE/BANDAGES/DRESSINGS) ×1
DERMABOND ADVANCED .7 DNX12 (GAUZE/BANDAGES/DRESSINGS) ×1 IMPLANT
DEVICE SUT QUICK LOAD TK 5 (STAPLE) ×2 IMPLANT
DEVICE SUT TI-KNOT TK 5X26 (MISCELLANEOUS) ×2 IMPLANT
DEVICE SUTURE ENDOST 10MM (ENDOMECHANICALS) ×2 IMPLANT
DRAPE LAPAROSCOPIC ABDOMINAL (DRAPES) ×3 IMPLANT
ELECT REM PT RETURN 15FT ADLT (MISCELLANEOUS) ×3 IMPLANT
GLOVE BIOGEL PI IND STRL 7.0 (GLOVE) ×2 IMPLANT
GLOVE BIOGEL PI INDICATOR 7.0 (GLOVE) ×1
GLOVE ECLIPSE 7.5 STRL STRAW (GLOVE) ×3 IMPLANT
GLOVE SURG SS PI 7.5 STRL IVOR (GLOVE) ×3 IMPLANT
GOWN STRL REUS W/TWL LRG LVL3 (GOWN DISPOSABLE) ×3 IMPLANT
GOWN STRL REUS W/TWL XL LVL3 (GOWN DISPOSABLE) ×12 IMPLANT
KIT BASIN OR (CUSTOM PROCEDURE TRAY) ×3 IMPLANT
MARKER SKIN DUAL TIP RULER LAB (MISCELLANEOUS) ×3 IMPLANT
PAD POSITIONING PINK XL (MISCELLANEOUS) ×2 IMPLANT
PLUG CATH AND CAP STER (CATHETERS) ×2 IMPLANT
POSITIONER SURGICAL ARM (MISCELLANEOUS) IMPLANT
POUCH RETRIEVAL ECOSAC 10 (ENDOMECHANICALS) ×4 IMPLANT
POUCH RETRIEVAL ECOSAC 10MM (ENDOMECHANICALS) ×2
RELOAD ENDO STITCH 2.0 (ENDOMECHANICALS) ×24
RELOAD STAPLE 45 3.5 BLU ETS (ENDOMECHANICALS) IMPLANT
RELOAD STAPLE 60 2.6 WHT THN (STAPLE) IMPLANT
RELOAD STAPLE 60 3.6 BLU REG (STAPLE) IMPLANT
RELOAD STAPLE 60 3.8 GOLD REG (STAPLE) IMPLANT
RELOAD STAPLE TA45 3.5 REG BLU (ENDOMECHANICALS) ×6 IMPLANT
RELOAD STAPLER BLUE 60MM (STAPLE) ×4 IMPLANT
RELOAD STAPLER GOLD 60MM (STAPLE) ×2 IMPLANT
RELOAD STAPLER WHITE 60MM (STAPLE) IMPLANT
RELOAD SUT SNGL STCH ABSRB 2-0 (ENDOMECHANICALS) IMPLANT
SCISSORS METZENBAUM CVD 33 (INSTRUMENTS) ×3 IMPLANT
SCISSORS METZENBAUM CVD 44CM (INSTRUMENTS) ×4 IMPLANT
SET IRRIG TUBING LAPAROSCOPIC (IRRIGATION / IRRIGATOR) ×3 IMPLANT
SHEARS HARMONIC ACE PLUS 36CM (ENDOMECHANICALS) ×2 IMPLANT
SLEEVE ADV FIXATION 12X100MM (TROCAR) ×3 IMPLANT
SLEEVE ADV FIXATION 5X100MM (TROCAR) ×6 IMPLANT
SPONGE DRAIN TRACH 4X4 STRL 2S (GAUZE/BANDAGES/DRESSINGS) ×3 IMPLANT
SPONGE LAP 18X18 X RAY DECT (DISPOSABLE) IMPLANT
STAPLER ECHELON LONG 60 440 (INSTRUMENTS) ×2 IMPLANT
STAPLER RELOAD BLUE 60MM (STAPLE) ×6
STAPLER RELOAD GOLD 60MM (STAPLE) ×3
STAPLER RELOAD WHITE 60MM (STAPLE)
STAPLER VISISTAT 35W (STAPLE) ×1 IMPLANT
SUT DVC SILK 2.0X39 (SUTURE) IMPLANT
SUT DVC VICRYL PGA 2.0X39 (SUTURE) ×9 IMPLANT
SUT ETHILON 2 0 PS N (SUTURE) ×4 IMPLANT
SUT MNCRL AB 4-0 PS2 18 (SUTURE) ×5 IMPLANT
SUT NOVA 0 T19/GS 22DT (SUTURE) ×4 IMPLANT
SUT RELOAD ENDO STITCH 2 48X1 (ENDOMECHANICALS) ×16
SUT SILK 2 0 (SUTURE)
SUT SILK 2 0 SH CR/8 (SUTURE) IMPLANT
SUT SILK 2-0 18XBRD TIE 12 (SUTURE) IMPLANT
SUT SILK 3 0 (SUTURE)
SUT SILK 3 0 SH CR/8 (SUTURE) IMPLANT
SUT SILK 3-0 18XBRD TIE 12 (SUTURE) IMPLANT
SUT VIC AB 2-0 SH 27 (SUTURE) ×6
SUT VIC AB 2-0 SH 27X BRD (SUTURE) ×3 IMPLANT
SUTURE RELOAD END STTCH 2 48X1 (ENDOMECHANICALS) ×16 IMPLANT
SYR BULB IRRIGATION 50ML (SYRINGE) ×2 IMPLANT
SYRINGE IRR TOOMEY STRL 70CC (SYRINGE) ×2 IMPLANT
TAPE CLOTH SURG 4X10 WHT LF (GAUZE/BANDAGES/DRESSINGS) ×2 IMPLANT
TIP RIGID 35CM EVICEL (HEMOSTASIS) ×2 IMPLANT
TOWEL OR 17X26 10 PK STRL BLUE (TOWEL DISPOSABLE) ×3 IMPLANT
TRAY CATH 16FR W/PLASTIC CATH (SET/KITS/TRAYS/PACK) ×3 IMPLANT
TRAY FOLEY W/METER SILVER 16FR (SET/KITS/TRAYS/PACK) ×1 IMPLANT
TRAY LAPAROSCOPIC (CUSTOM PROCEDURE TRAY) ×3 IMPLANT
TROCAR ADV FIXATION 12X100MM (TROCAR) ×3 IMPLANT
TROCAR ADV FIXATION 5X100MM (TROCAR) ×3 IMPLANT
TROCAR BLADELESS OPT 5 100 (ENDOMECHANICALS) ×3 IMPLANT
TROCAR XCEL BLUNT TIP 100MML (ENDOMECHANICALS) ×2 IMPLANT
TROCAR XCEL NON-BLD 11X100MML (ENDOMECHANICALS) IMPLANT
TUBING CONNECTING 10 (TUBING) ×4 IMPLANT
TUBING INSUF HEATED (TUBING) ×3 IMPLANT

## 2017-10-25 NOTE — Anesthesia Procedure Notes (Signed)
Procedure Name: Intubation Date/Time: 10/25/2017 7:28 AM Performed by: Gwyndolyn Saxon, CRNA Pre-anesthesia Checklist: Patient identified, Emergency Drugs available, Suction available, Patient being monitored and Timeout performed Patient Re-evaluated:Patient Re-evaluated prior to induction Oxygen Delivery Method: Circle system utilized Preoxygenation: Pre-oxygenation with 100% oxygen Induction Type: IV induction, Rapid sequence and Cricoid Pressure applied Laryngoscope Size: Miller and 2 Grade View: Grade I Tube type: Oral Tube size: 7.0 mm Number of attempts: 1 Placement Confirmation: ETT inserted through vocal cords under direct vision,  positive ETCO2,  CO2 detector and breath sounds checked- equal and bilateral Secured at: 21 cm Tube secured with: Tape Dental Injury: Teeth and Oropharynx as per pre-operative assessment

## 2017-10-25 NOTE — Transfer of Care (Signed)
Immediate Anesthesia Transfer of Care Note  Patient: [name]  Procedure(s) Performed: LAPAROSCOPIC REVISION OF GASTROJEJUNOSTOMY AND PARTIAL GASTRECTOMY, WITH PLACEMENT OF FEEDING GASTROSTOMY TUBE (N/A Abdomen)  Patient Location: PACU  Anesthesia Type:General  Level of Consciousness: drowsy  Airway & Oxygen Therapy: Patient Spontanous Breathing and Patient connected to face mask oxygen  Post-op Assessment: Report given to RN and Post -op Vital signs reviewed and stable  Post vital signs: Reviewed and stable  Last Vitals:  Vitals:   10/25/17 0533 10/25/17 0535  BP:  128/88  Pulse:  75  Resp: 16   Temp:  36.9 C  SpO2:  100%    Last Pain:  Vitals:   10/25/17 0535  TempSrc: Oral      Patients Stated Pain Goal: 4 (22/44/97 5300)  Complications: No apparent anesthesia complications

## 2017-10-25 NOTE — H&P (Signed)
History of Present Illness Marland Kitchen T. Evamarie Raetz MD; 10/14/2017 12:25 PM) The patient is a 42 year old female presenting status-post bariatric surgery. She returns approximately 10 months following revision of sleeve gastrectomy to Roux-en-Y gastric bypass for persistent intolerance of oral intake. Also GERD. She had a feeding jejunostomy that was removed at the time of her gastric bypass. She unfortunately has had persistent difficulty with oral intake and nausea. We did get an upper GI series that showed a small hiatal hernia but no obstruction. There was some slight irregularity in the upper pouch and ulcer was questioned. Dr. Lucia Gaskins performed upper endoscopy which did show 2 small marginal ulcers. This was about 6 months ago. She remains on Carafate and Protonix which she is getting down. She tolerates liquids moderately but really cannot eat any solid foods due to some pain but mostly nausea and vomiting. We did place a PICC line so she can get weekly hydration. This seems to help her dizziness. Dr. Lucia Gaskins repeated endoscopy in August, that showed still marginal ulceration although it appeared slightly improved from her original endoscopy. She has gone through a hysterectomy for dysfunctional uterine bleeding and failed ablation. She tolerated this well.  Lab work 06/14/17 shows normal electrolytes, BUN/creatinine, albumin 3.8 and normal LFTs. White count normal. Hemoglobin was 8.9. Vitamin levels were all normal in June. Vitamin D level normal last visit.  Lab work 09/22/17 shows sodium 134, calcium 8.5, normal BUN/creatinine, albumin 3.6. Normal LFTs. Normal white blood cell count. Hemoglobin 8.2. Vitamin levels normal except for vitamin D 10.5. Ferritin 5. Dr. Excell Seltzer contacted the patient and recommended recommended new iron daily supplement, vitamin D3, and possible need for iron infusion.  She however presented to the office about 2 weeks ago with acute exacerbation of her  epigastric pain with sharp pain particularly after eating and increased nausea. We went ahead with urgent repeat endoscopy and Dr. Lucia Gaskins did find a persistent marginal ulcer on the jejunal side measuring about a centimeter. This may be slightly smaller than previously but still significant and persistent after months of treatment. Her pain has eased up a little bit over the last couple weeks but still was a significant problem for her. She comes to the office today for treatment planning.   Problem List/Past Medical Marland Kitchen T. 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 Trayce Caravello, MD; 10/14/2017 12:25 PM) Colon Polyp Removal - Colonoscopy  Foot Surgery  Bilateral. Hemorrhoidectomy  Knee Surgery  Left. Oral Surgery   Diagnostic Studies History Marland Kitchen T. Rehana Uncapher, MD; 10/14/2017 12:25 PM) Colonoscopy  1-5 years ago Pap Smear  1-5 years ago  Allergies (Tanisha A. Owens Shark, RMA; 10/14/2017 12:04 PM) MORPHINE  Doxycycline Hyclate *Tetracyclines**  Penicillin G Pot in Dextrose *PENICILLINS*  OxyCODONE HCl *ANALGESICS - OPIOID*  Ciprofloxacin HCl *Fluoroquinolones**  Allergies Reconciled   Medication History (Tanisha A. Owens Shark, [place]; 10/14/2017 12:04 PM) Protonix (40MG Tablet DR, 1 (one) Oral two times daily, Taken starting 06/10/2017) Active. Zofran ODT (4MG Tablet Disint, 1 (one) Oral every six hours, as needed, Taken starting 08/13/2017) Active. Thiamine HCl (100MG/ML Solution, Injection) Active. Thiamine HCl (100MG Tablet, Oral) Active. Hydrocortisone (25MG Suppository, Rectal) Active. Monurol (3GM Packet, Oral)  Active. Flonase (50MCG/ACT Suspension, Nasal) Active. Diclofenac Sodium (  Transdermal) Specific strength unknown - Active. Acetaminophen (500MG Tablet, Oral) Active. Acetaminophen (650MG Tablet, Oral) Active. Calcium Citrate-Vitamin D3 (Oral) Specific strength unknown - Active. Alisa Graff Vitamin D3 (1000UNIT/10ML Liquid, Oral) Active. Multivitamins (Oral) Active. Proventil HFA (108 (90 Base)MCG/ACT Aerosol Soln, Inhalation) Active. EPINEPHrine (0.3MG/0.3ML Device, Injection) Active. Vitamin B1 (100MG Tablet, Oral) Active. Medications Reconciled  Social History Marland Kitchen T. Kylee Umana, MD; 10/14/2017 12:25 PM) Caffeine use  Coffee, Tea. Tobacco use  Current some day smoker. No drug use  Alcohol use  Occasional alcohol use.  Family History Marland Kitchen T. Whalen Trompeter, MD; 10/14/2017 12:25 PM) Heart disease in female family member before age 64  Heart Disease  Brother. Hypertension  Father. Heart disease in female family member before age 25  Arthritis  Father, Mother. Colon Cancer  Family Members In General. Colon Polyps  Father. Diabetes Mellitus  Family Members In General.  Pregnancy / Birth History Marland Kitchen T. Girard Koontz, MD; 10/14/2017 12:25 PM) Contraceptive History  Contraceptive implant, Depo-provera, Intrauterine device. Gravida  5 Para  4 Age at menarche  54 years. Regular periods   Other Problems Marland Kitchen T. Lupe Bonner, MD; 10/14/2017 12:25 PM) Anxiety Disorder  Gastroesophageal Reflux Disease  Back Pain  Migraine Headache  Arthritis  Hemorrhoids  Depression  Ulcerative Colitis  Transfusion history   Vitals (Tanisha A. Brown RMA; 10/14/2017 12:04 PM) 10/14/2017 12:04 PM Weight: 179.6 lb Height: 67in Body Surface Area: 1.93 m Body Mass Index: 28.13 kg/m  Temp.: 98.15F  Pulse: 97 (Regular)  BP: 120/84 (Sitting, Left Arm, Standard)       Physical Exam Marland Kitchen T. Adael Culbreath MD; 10/14/2017 12:26 PM) The physical exam  findings are as follows: Note:General: Alert, normal weight after Sherilyn Cooter and female, in no acute distress Skin: Warm and dry without rash or infection. HEENT: No palpable masses or thyromegaly. Sclera nonicteric. Pupils equal round and reactive. Lymph nodes: No cervical, supraclavicular, or inguinal nodes palpable. Lungs: Breath sounds clear and equal. No wheezing or increased work of breathing. Cardiovascular: Regular rate and rhythm without murmer. No JVD or edema. Abdomen: Nondistended. Mild epigastric tenderness without guarding. No masses palpable. No organomegaly. No palpable hernias. Extremities: No edema or joint swelling or deformity. No chronic venous stasis changes. Neurologic: Alert and fully oriented. Gait normal. No focal weakness. Psychiatric: Normal mood and affect. Thought content appropriate with normal judgement and insight    Assessment & Plan Marland Kitchen T. 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 This has failed to heal despite prolonged maximal medical management. These findings were all discussed with the patient in detail. At this point I think our only choice is to proceed with surgical revision with resection of her gastrojejunostomy. Dr. Lucia Gaskins did feel that her pouch was a little bit long and possibly could be producing excess acid. I discussed the surgery in detail with the patient explaining the indications and exact nature of the surgery, expected recovery and risks of anesthetic complications, bleeding, infection, anastomotic leak, recurrent ulceration and persistent symptoms. As she has had so much trouble after her previous operations I did recommend placing a feeding tube at the time of surgery as well. I  will evaluate whether she can have a gastrostomy tube with her small remnant or she may require a jejunostomy tube. All her questions were answered and she would like to proceed. Current  Plans Revision of gastrojejunostomy and placement of feeding tube

## 2017-10-25 NOTE — Interval H&P Note (Signed)
History and Physical Interval Note:  10/25/2017 7:15 AM  [name]  has presented today for surgery, with the diagnosis of HISTORY OF GASTRIC BYPASS, ACUTE MARGINAL ULCER, MALNUTRITION, DEHYDRATION, COMPLICATION OF FEEDING TUBE  The various methods of treatment have been discussed with the patient and family. After consideration of risks, benefits and other options for treatment, the patient has consented to  Procedure(s): LAPAROSCOPIC REVISION OF GASTROJEJUNOSTOMY, replacement of Feeding Tube (N/A) REPLACEMENT OF GASTROSTOMY TUBE (N/A) as a surgical intervention .  The patient's history has been reviewed, patient examined, no change in status, stable for surgery.  I have reviewed the patient's chart and labs.  Questions were answered to the patient's satisfaction.     Darene Lamer Baxter Gonzalez

## 2017-10-25 NOTE — Anesthesia Postprocedure Evaluation (Signed)
Anesthesia Post Note  Patient: [name]  Procedure(s) Performed: LAPAROSCOPIC REVISION OF GASTROJEJUNOSTOMY AND PARTIAL GASTRECTOMY, WITH PLACEMENT OF FEEDING GASTROSTOMY TUBE (N/A Abdomen)     Patient location during evaluation: PACU Anesthesia Type: General Level of consciousness: awake and alert, oriented and patient cooperative Pain management: pain level controlled Vital Signs Assessment: post-procedure vital signs reviewed and stable Respiratory status: spontaneous breathing, nonlabored ventilation, respiratory function stable and patient connected to nasal cannula oxygen Cardiovascular status: blood pressure returned to baseline and stable Postop Assessment: no apparent nausea or vomiting Anesthetic complications: no    Last Vitals:  Vitals:   10/25/17 1315 10/25/17 1330  BP: 124/79 126/76  Pulse: 60 (!) 58  Resp: 12 12  Temp:  36.9 C  SpO2: 100% 100%    Last Pain:  Vitals:   10/25/17 1254  TempSrc:   PainSc: 10-Worst pain ever                 Jeanee Fabre,E. Eion Timbrook

## 2017-10-25 NOTE — Op Note (Signed)
Preoperative Diagnosis: HISTORY OF GASTRIC BYPASS,  MARGINAL ULCER, MALNUTRITION  Postoprative Diagnosis: Same  Procedure: Procedure(s): LAPAROSCOPIC REVISION OF GASTROJEJUNOSTOMY WITH PARTIAL GASTRECTOMY, WITH PLACEMENT OF FEEDING GASTROSTOMY TUBE   Surgeon: Excell Seltzer T   Assistants: Johnathan Hausen  Anesthesia:  General endotracheal anesthesia  Indications: Patient is a 42 year old female with history approximately 2 years ago of sleeve gastrectomy which was converted about 9 months ago to Roux-en-Y due to persistent severe reflux.  She however has had ongoing issues with pain and nausea and food intolerance for a number of months with endoscopy showing a marginal ulcer.  Despite maximum medical management recent endoscopy shows a persistent ulcer and her symptoms are not improved.  I therefore recommended proceeding with revision with resection of her gastrojejunostomy and we will also plan to place a feeding tube due to ongoing malnutrition.  Procedure and risks and indications have been discussed extensively with the patient and her family in detail elsewhere.    Procedure Detail: Patient was brought to the operating room, placed in the supine position on the operating table, and general endotracheal anesthesia induced.  She received preoperative IV antibiotics and subcutaneous heparin.  PAS were in place.  The abdomen was widely sterilely prepped and draped.  Patient timeout was performed and correct procedure verified.  I elected to access via open Hassan technique using the previous medial right upper quadrant incision.  A 2 cm incision was made and dissection carried down to the anterior fascia which was elevated between Coker's and incised and the peritoneum entered under direct vision.  Through a mattress suture of 0 Vicryl the Hassan trocar was placed and pneumoperitoneum established.  Under direct vision I placed 5 mm trochars in the left upper quadrant, above and to the left of  the umbilicus for the camera port and in the lateral right upper quadrant a 12 mm trocar all using previous incision sites.  Through a 5 mm sub-xiphoid site the [facility] retractor was placed in the left lobe of the liver elevated with excellent exposure of the hiatus and the gastric pouch and gastrojejunostomy.  There were no adhesions to the liver or anterior abdominal wall.  The gastrojejunostomy and pouch were easily visualized.  The gastric pouch was somewhat long and narrow measuring 10 cm in length.  Externally the gastrojejunostomy was unremarkable.  We ran the small bowel down to the jejunojejunostomy which appeared completely normal as did the distal bowel.  The gastric remnant was visualized.  Some adhesions between the gastric remnant and the gastrojejunostomy were taken down and then we measured 5 cm from the hiatus at healthy-appearing stomach planning a 5 cm stomach pouch.  I examined the hiatus which did not show any obvious evidence of hernia.  A sizing balloon was passed orally into the gastric pouch and with the balloon blown up to 10 cc it pullback snugly against the hiatus with no evidence of hiatal hernia.  This balloon was desufflated and the tube removed.  We then dissected close to the lesser curve on the gastric pouch working proximally up to the measured 5 cm site.  At this point the stomach was divided with a single firing of the Echelon 60 mm gold load stapler angling slightly up toward the left upper quadrant.  This completely divided the stomach and resulted in resecting of about one half of the pouch distally.  A point of resection of the Roux limb just below the anastomosis a centimeter or 2 was chosen with healthy-appearing bowel  and this was divided with the blue load 60 mm Echelon stapler.  The mesentery of the gastrojejunostomy was divided with harmonic scalpel near the bowel and a few further adhesions taken down and the specimen completely freed, placed in a eco-catch bag and  brought up to the right upper quadrant medial incision.  This incision and fascial defect was lengthened slightly to allow the specimen to be removed.  Two 0 Novafil sutures were placed on either end of the fascial defect and he is on trocar replaced and pneumoperitoneum established. The Roux limb was brought up to the new pouch under no tension.  A posterior row of running 2-0 Vicryl was placed.  The Ewald tube was passed into the pouch and using this is a stent enterotomies were made in the gastric pouch and the Roux limb.  The stapler was placed for a linear anastomosis with the blue load 45 mm stapler.  The stapler was fired.  At this point however as we inspected the pouch it was clear that the posterior arm of the stapler had gone posterior to the pouch and not into the gastrotomy and had essentially just stapled across the small intestine.  Therefore this was completely taken down removing the previous 2-0 Vicryl suture and cutting through a few staples that had caught the posterior wall of the stomach completely freeing the small bowel.  The Roux limb was taken back a few centimeters to healthy appearing bowel and divided again with the blue load 60 mm Echelon stapler.  We carefully inspected the gastric pouch.  The enterotomy was identified and the previous staple line appeared completely intact with no injury to the pouch at all.  The Roux limb was brought back up in another posterior 20 Vicryl Row was placed between the Roux limb and the gastric staple line taking this up to the gastrotomy.  An enterotomy was made in the Roux limb adjacent to this and the 45 mm blue load stapler was introduced and an approximately 3 cm anastomosis created.  There was no bleeding from the staple line.  The common enterotomy was closed from either end with running 2-0 Vicryl.  The Ewald tube was passed down through the anastomosis and an anterior row of running 2-0 Vicryl was placed with an SH needle.  Dr. Hassell Done performed  upper endoscopy showing a 4-5 cm pouch with a widely patent anastomosis and under tense insufflation with air under saline irrigation there was no evidence of leak.  The pouch was desufflated.  New line attention was then turned to placing a feeding tube.  I began to mobilize the proximal end of the gastric remnant dividing some adhesions and this was soft and quite mobile and it was apparent this could be mobilized and brought up to the anterior abdominal wall for placement of a gastrostomy tube.  Vasculature along the lesser and greater curve of the gastric remnant was mobilized for short distances as well as dividing adhesions and some posterior avascular attachments allowing good mobility.  A site on the abdominal wall in the epigastrium was chosen for the feeding tube and a small incision made and dissection carried through the abdominal wall with a trocar and dilated with Kelly clamp.  A 22 French Foley catheter was introduced into the abdomen.  I placed a 2-0 Vicryl pursestring suture at the end of the gastric remnant which was then opened at this point and the Foley catheter inserted and balloon inflated.  The pursestring suture was secured  and the stomach pulled up to the anterior abdominal wall without any undue tension and secured to the anterior abdominal wall with interrupted 2-0 Vicryl.  The tube irrigated fully the gastric remnant easily without evidence of leak.  The abdomen was carefully inspected.  Evicel was used to coat the suture and staple lines of the gastrojejunostomy.  A bilateral T AP block had been performed with dilute Exparel.  Incisions were further infiltrated.  The [facility] trocar was moved from the right mid abdominal site and the fascia are closed with interrupted 0 Novafil.  This was inspected for scopic Truman Hayward and appeared securely closed and no viscera were caught with the sutures.  All CO2 was evacuated and trochars removed.  The gastrostomy was sutured with 2-0 nylon to the skin.   The skin incisions were closed with subcuticular Monocryl and Dermabond.  Sponge needle and instrument counts were correct.    Findings: Above  Estimated Blood Loss:  less than 50 mL         Drains: None  Blood Given: none          Specimens: Distal gastric pouch with gastrojejunostomy        Complications:  * No complications entered in OR log *         Disposition: PACU - hemodynamically stable.         Condition: stable

## 2017-10-25 NOTE — Op Note (Signed)
[name] [id number] 12-13-1975 10/25/2017  Preoperative diagnosis: revision of gastrojejunostomy  Postoperative diagnosis: Same   Procedure: Upper endoscopy   Surgeon: Catalina Antigua B. Hassell Done  M.D., FACS   Anesthesia: Gen.   Indications for procedure: This patient was undergoing a revision of a gastrojejunostomy.    Description of procedure: The endoscopy was placed in the mouth and into the oropharynx and under endoscopic vision it was advanced to the esophagogastric junction.  The pouch was insufflated and was about 4 cm long.  The gastrojejunostomy was patent.   No bleeding or leaks were detected.  The scope was withdrawn without difficulty.     Matt B. Hassell Done, MD, FACS General, Bariatric, & Minimally Invasive Surgery [facility], [place]

## 2017-10-26 ENCOUNTER — Encounter (HOSPITAL_COMMUNITY): Payer: Self-pay | Admitting: General Surgery

## 2017-10-26 LAB — CBC WITH DIFFERENTIAL/PLATELET
BASOS ABS: 0 10*3/uL (ref 0.0–0.1)
Basophils Relative: 0 %
EOS PCT: 2 %
Eosinophils Absolute: 0.1 10*3/uL (ref 0.0–0.7)
HCT: 21.9 % — ABNORMAL LOW (ref 36.0–46.0)
Hemoglobin: 6.8 g/dL — CL (ref 12.0–15.0)
LYMPHS ABS: 2.4 10*3/uL (ref 0.7–4.0)
LYMPHS PCT: 30 %
MCH: 23.2 pg — AB (ref 26.0–34.0)
MCHC: 31.1 g/dL (ref 30.0–36.0)
MCV: 74.7 fL — AB (ref 78.0–100.0)
Monocytes Absolute: 0.5 10*3/uL (ref 0.1–1.0)
Monocytes Relative: 6 %
NEUTROS ABS: 4.9 10*3/uL (ref 1.7–7.7)
NEUTROS PCT: 62 %
PLATELETS: 259 10*3/uL (ref 150–400)
RBC: 2.93 MIL/uL — AB (ref 3.87–5.11)
RDW: 16.8 % — ABNORMAL HIGH (ref 11.5–15.5)
WBC: 7.9 10*3/uL (ref 4.0–10.5)

## 2017-10-26 LAB — PREPARE RBC (CROSSMATCH)

## 2017-10-26 LAB — HEMOGLOBIN AND HEMATOCRIT, BLOOD
HCT: 26.4 % — ABNORMAL LOW (ref 36.0–46.0)
HEMOGLOBIN: 8.1 g/dL — AB (ref 12.0–15.0)

## 2017-10-26 MED ORDER — FLUOXETINE HCL 20 MG/5ML PO SOLN
10.0000 mg | Freq: Every day | ORAL | Status: DC
  Administered 2017-10-26 – 2017-10-27 (×2): 10 mg via ORAL
  Filled 2017-10-26 (×3): qty 5

## 2017-10-26 MED ORDER — SODIUM CHLORIDE 0.9 % IV SOLN
Freq: Once | INTRAVENOUS | Status: AC
  Administered 2017-10-26: 10:00:00 via INTRAVENOUS

## 2017-10-26 NOTE — Progress Notes (Signed)
Patient is refusing to get out of bed to ambulate or use the bathroom to urinate, she has voided 300 ml of urine once this shift

## 2017-10-26 NOTE — Progress Notes (Signed)
Patient ID: REYANNA BALEY, female   DOB: 19-Sep-1976, 42 y.o.   MRN: [id number] 1 Day Post-Op   Subjective: Has been sore, pain controlled with medications.  Some nausea but no vomiting.  Has been up walking but not much.  Objective: Vital signs in last 24 hours: Temp:  [97.1 F (36.2 C)-98.4 F (36.9 C)] 97.8 F (36.6 C) (01/29 0500) Pulse Rate:  [58-78] 66 (01/29 0500) Resp:  [12-20] 16 (01/29 0500) BP: (117-136)/(64-84) 120/71 (01/29 0500) SpO2:  [100 %] 100 % (01/29 0500)    Intake/Output from previous day: 01/28 0701 - 01/29 0700 In: 3003.3 [I.V.:3003.3] Out: 1025 [Urine:1000; Blood:25] Intake/Output this shift: No intake/output data recorded.  General appearance: alert, cooperative and no distress Resp: clear to auscultation bilaterally GI: Nondistended.  Mild appropriate incisional tenderness. Incision/Wound: No drainage or erythema  Lab Results:  Recent Labs    10/25/17 1617 10/26/17 0510  WBC  --  7.9  HGB 8.2* 6.8*  HCT 26.8* 21.9*  PLT  --  259   BMET No results for input(s): NA, K, CL, CO2, GLUCOSE, BUN, CREATININE, CALCIUM in the last 72 hours.   Studies/Results: No results found.  Anti-infectives: Anti-infectives (From admission, onward)   Start     Dose/Rate Route Frequency Ordered Stop   10/25/17 1120  ceFAZolin (ANCEF) 2-4 GM/100ML-% IVPB    Comments:  Capozzi, Stephanie  : cabinet override      10/25/17 1120 10/25/17 2329   10/25/17 0800  ceFAZolin (ANCEF) IVPB 2 g/50 mL premix     2 g 100 mL/hr over 30 Minutes Intravenous  Once 10/25/17 0746 10/25/17 1157   10/25/17 0709  ceFAZolin (ANCEF) 2-4 GM/100ML-% IVPB    Comments:  Nicholes Calamity  : cabinet override      10/25/17 0709 10/25/17 1914   10/25/17 0541  levofloxacin (LEVAQUIN) IVPB 750 mg  Status:  Discontinued     750 mg 100 mL/hr over 90 Minutes Intravenous On call to O.R. 10/25/17 0541 10/25/17 0953      Assessment/Plan: s/p Procedure(s): LAPAROSCOPIC REVISION OF  GASTROJEJUNOSTOMY AND PARTIAL GASTRECTOMY, WITH PLACEMENT OF FEEDING GASTROSTOMY TUBE Drop in hemoglobin, likely postop blood loss anemia plus rehydration.  Discussed with patient and plan to transfuse 1 unit PRBCs today. Hold Lovenox today Patient otherwise stable without issues.  Start postop day #1 diet. Plan to instruct patient in G-tube care and tube feedings.   LOS: 1 day    Edward Jolly 10/26/2017

## 2017-10-26 NOTE — Progress Notes (Signed)
Educated pt on how to administer bolus feedings. Educational handouts given on feeding tube use. Pt verbalized understanding. All questions were answered.

## 2017-10-26 NOTE — Progress Notes (Signed)
Discussed patient request for home med, Prozac.  Patient to start Judie Petit clears today and begin progression of diet slowly.  Gtube in place for possible bolus feeds if supplementation is needed at a later time. Orders placed for dietician consult for recommendations related to bolus feed.  Spoke with bedside RN Zhara regarding education with Gtube.  Patient is NOT TO START BOLUS FEEDS at this time.

## 2017-10-26 NOTE — Progress Notes (Signed)
Initial Nutrition Assessment  INTERVENTION:   Tube feeding recommendations: -Initiate with 1/2 carton (120 ml) of Osmolite 1.5 TID. -Once tolerating this amount, advance to 1 carton (240 ml) of Osmolite 1.5 TID. -Goal rate will be 1 carton (240 ml) of Osmolite 1.5 every 3 hours for a total of 5 cartons a day. -Recommend 60 ml of free water before and after each bolus. -Will need additional free water via tube or PO of 480 ml daily -Regimen at goal rate will provide 1775 kcal (99% of needs), 74g of protein and 1980 ml H2O.   NUTRITION DIAGNOSIS:   Inadequate oral intake related to nausea, vomiting as evidenced by per patient/family report.  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  She however has had ongoing issues with pain and nausea and food intolerance for a number of months with endoscopy showing a marginal ulcer.  Despite maximum medical management recent endoscopy shows a persistent ulcer and her symptoms are not improved.  1/28: LAPAROSCOPIC REVISION OF GASTROJEJUNOSTOMY WITH PARTIAL GASTRECTOMY, WITH PLACEMENT OF FEEDING GASTROSTOMY TUBE  Spoke with Bariatric nurse coordinator regarding pt's plan of care. Pt to have diet progressed as usual for post-op bariatric surgery. 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 If patient unable to tolerate oral diet, g-tube will be utilized to meet nutritional needs. TF recommendations for bolus feeds are stated above. Given pt's poor PO intake, will need to advance tube feeding gradually to ensure tolerance.   Pt's main complaint during RD  visit today was gas pains. She reports receiving pain medication.  RD will continue to monitor for plan during admission.  Pt's weight has remained steady over the past year.   Labs reviewed. Medications: D5 and .9% NaCl w/ KCl infusion at 75 ml/hr -provides 306 kcal    NUTRITION - FOCUSED PHYSICAL EXAM:    Most Recent Value  Orbital Region  No depletion  Upper Arm Region  Mild depletion  Thoracic and Lumbar Region  Unable to assess  Buccal Region  No depletion  Temple Region  Mild depletion  Clavicle Bone Region  No depletion  Clavicle and Acromion Bone Region  No depletion  Scapular Bone Region  No depletion  Dorsal Hand  No depletion  Patellar Region  Unable to assess  Anterior Thigh Region  Unable to assess  Posterior Calf Region  Unable to assess  Edema (RD Assessment)  None       Diet Order:  Diet bariatric clear liquid Room service appropriate? Yes; Fluid consistency: Thin  EDUCATION NEEDS:   Not appropriate for education at this time  Skin:  Skin Assessment: Reviewed RN Assessment  Last BM:  PTA  Height:   Ht Readings from Last 1 Encounters:  10/25/17 5' 7"  (1.702 m)    Weight:   Wt Readings from Last 1 Encounters:  10/26/17 183 lb 3.2 oz (83.1 kg)    Ideal Body Weight:  61.4 kg  BMI:  Body mass index is 28.69 kg/m.  Estimated Nutritional Needs:   Kcal:  1800-2000  Protein:  85-95g  Fluid:  2L/day   Clayton Bibles, MS, RD, LDN  [place] [facility] Pager: [phone] After Hours Pager: [phone]

## 2017-10-26 NOTE — Progress Notes (Signed)
Patient alert and oriented, Post op day 1.  Provided support and encouragement.  Encouraged pulmonary toilet, ambulation and small sips of liquids.  Started on Bari clear fluids this am.  Patient to receive 1 unit PRBC.  All questions answered.  Will continue to monitor.

## 2017-10-27 ENCOUNTER — Encounter (HOSPITAL_COMMUNITY): Payer: 59

## 2017-10-27 LAB — CBC WITH DIFFERENTIAL/PLATELET
BASOS PCT: 0 %
Basophils Absolute: 0 10*3/uL (ref 0.0–0.1)
EOS PCT: 5 %
Eosinophils Absolute: 0.3 10*3/uL (ref 0.0–0.7)
HCT: 26.7 % — ABNORMAL LOW (ref 36.0–46.0)
Hemoglobin: 8.2 g/dL — ABNORMAL LOW (ref 12.0–15.0)
LYMPHS ABS: 1.5 10*3/uL (ref 0.7–4.0)
Lymphocytes Relative: 27 %
MCH: 23.5 pg — AB (ref 26.0–34.0)
MCHC: 30.7 g/dL (ref 30.0–36.0)
MCV: 76.5 fL — ABNORMAL LOW (ref 78.0–100.0)
MONOS PCT: 7 %
Monocytes Absolute: 0.4 10*3/uL (ref 0.1–1.0)
Neutro Abs: 3.3 10*3/uL (ref 1.7–7.7)
Neutrophils Relative %: 61 %
PLATELETS: 249 10*3/uL (ref 150–400)
RBC: 3.49 MIL/uL — ABNORMAL LOW (ref 3.87–5.11)
RDW: 17.3 % — ABNORMAL HIGH (ref 11.5–15.5)
WBC: 5.4 10*3/uL (ref 4.0–10.5)

## 2017-10-27 LAB — BPAM RBC
BLOOD PRODUCT EXPIRATION DATE: 201902262359
ISSUE DATE / TIME: 201901290914
Unit Type and Rh: 5100

## 2017-10-27 LAB — TYPE AND SCREEN
ABO/RH(D): O POS
Antibody Screen: NEGATIVE
UNIT DIVISION: 0

## 2017-10-27 MED ORDER — CYANOCOBALAMIN 1000 MCG/ML IJ SOLN
1000.0000 ug | Freq: Once | INTRAMUSCULAR | Status: AC
  Administered 2017-10-27: 1000 ug via INTRAMUSCULAR
  Filled 2017-10-27: qty 1

## 2017-10-27 MED ORDER — HEPARIN SOD (PORK) LOCK FLUSH 100 UNIT/ML IV SOLN
250.0000 [IU] | INTRAVENOUS | Status: AC | PRN
  Administered 2017-10-27: 250 [IU]

## 2017-10-27 MED ORDER — HYDROCODONE-ACETAMINOPHEN 5-325 MG PO TABS: 1.0000 | ORAL_TABLET | Freq: Four times a day (QID) | ORAL | 0 refills | Status: DC | PRN

## 2017-10-27 MED ORDER — LINACLOTIDE 145 MCG PO CAPS
290.0000 ug | ORAL_CAPSULE | Freq: Every day | ORAL | Status: DC
  Administered 2017-10-27: 290 ug via ORAL
  Filled 2017-10-27: qty 2

## 2017-10-27 MED ORDER — HYDROCODONE-ACETAMINOPHEN 5-325 MG PO TABS: 1.0000 | ORAL_TABLET | Freq: Four times a day (QID) | ORAL | 0 refills | Status: AC | PRN

## 2017-10-27 MED ORDER — CYANOCOBALAMIN 1000 MCG/ML IJ SOLN
1000.0000 ug | Freq: Once | INTRAMUSCULAR | Status: DC
  Filled 2017-10-27: qty 1

## 2017-10-27 NOTE — Progress Notes (Signed)
Patient ID: [name], female   DOB: 03/19/1976, 42 y.o.   MRN: [id number] 2 Days Post-Op   Subjective: Doing well this morning.  Tolerating her clear liquids without nausea which already seems better to her than preoperatively.  Some burning on the way down but not severe.  Pain well controlled.  Objective: Vital signs in last 24 hours: Temp:  [98.1 F (36.7 C)-99.3 F (37.4 C)] 99.1 F (37.3 C) (01/30 0548) Pulse Rate:  [60-68] 61 (01/30 0548) Resp:  [16-18] 18 (01/30 0548) BP: (109-131)/(63-78) 115/63 (01/30 0548) SpO2:  [99 %-100 %] 100 % (01/30 0548) Weight:  [83.1 kg (183 lb 3.2 oz)] 83.1 kg (183 lb 3.2 oz) (01/29 0827)    Intake/Output from previous day: 01/29 0701 - 01/30 0700 In: 1860 [P.O.:960; I.V.:900] Out: 4025 [Urine:4025] Intake/Output this shift: No intake/output data recorded.  General appearance: alert, cooperative and no distress Resp: No wheezing or increased work of breathing GI: Minimal appropriate incisional tenderness.  Nondistended. Incision/Wound: No erythema or drainage  Lab Results:  Recent Labs    10/26/17 0510 10/26/17 1630 10/27/17 0410  WBC 7.9  --  5.4  HGB 6.8* 8.1* 8.2*  HCT 21.9* 26.4* 26.7*  PLT 259  --  249   BMET No results for input(s): NA, K, CL, CO2, GLUCOSE, BUN, CREATININE, CALCIUM in the last 72 hours.   Studies/Results: No results found.  Anti-infectives: Anti-infectives (From admission, onward)   Start     Dose/Rate Route Frequency Ordered Stop   10/25/17 1120  ceFAZolin (ANCEF) 2-4 GM/100ML-% IVPB    Comments:  Capozzi, Stephanie  : cabinet override      10/25/17 1120 10/25/17 2329   10/25/17 0800  ceFAZolin (ANCEF) IVPB 2 g/50 mL premix     2 g 100 mL/hr over 30 Minutes Intravenous  Once 10/25/17 0746 10/25/17 1157   10/25/17 0709  ceFAZolin (ANCEF) 2-4 GM/100ML-% IVPB    Comments:  Nicholes Calamity  : cabinet override      10/25/17 0709 10/25/17 1914   10/25/17 0541  levofloxacin (LEVAQUIN) IVPB 750  mg  Status:  Discontinued     750 mg 100 mL/hr over 90 Minutes Intravenous On call to O.R. 10/25/17 0541 10/25/17 0953      Assessment/Plan: s/p Procedure(s): LAPAROSCOPIC REVISION OF GASTROJEJUNOSTOMY AND PARTIAL GASTRECTOMY, WITH PLACEMENT OF FEEDING GASTROSTOMY TUBE Doing well postoperatively.  Postoperative blood loss anemia with appropriate response to 1 unit transfusion.  No evidence of ongoing bleeding. Advance to protein shakes.  Plan discharge later today. Plan to use gastrostomy tube as a supplement at home as needed and this was discussed with her.   LOS: 2 days    Edward Jolly 10/27/2017

## 2017-10-27 NOTE — Discharge Summary (Signed)
Patient ID: [name] [id number] 41 y.o. 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  She however has had persistent pain and nausea and a marginal ulcer that has not healed despite maximum medical management.  We therefore recommended proceeding with revision of her gastrojejunostomy with partial gastrectomy as well as placement of a feeding tube for postoperative supplementation if needed.  She is admitted following this procedure.  Hospital Course: Patient underwent a laparoscopic revision of her gastrojejunostomy with resection of her distal gastric pouch leaving an approximately 4 cm pouch.  Also underwent placement of a gastrostomy tube in her gastric remnant.  Generally tolerated the procedure well.  On the first postoperative day her hemoglobin was noted to be decreased to 6.8 from preoperative of 8.5.  She was asymptomatic and vital signs stable.  She received 1 unit transfusion.  She was started on clear liquids which she tolerated well.  On the second postoperative day her hemoglobin is appropriately increased to above 8.  Vital signs remained stable.  Pain is well controlled.  She is tolerating fluids which she says is already easier than preoperatively.  She has been instructed on care of her G-tube and this is being flushed with water.  Her abdomen  is benign.  Wounds healing well.  Plan is for discharge later today if tolerating protein shakes well.  Dietary recommended tube feeding of Osmolite 1.5 totaling 5 240 mL cartons per day for full nutritional support.  Recommended 60 mL's water before and after each bolus.  Likely would not be able to tolerate that size bolus due to her small gastric remnant and for now this will be used for supplementation as needed only.    Disposition: Home  Patient Instructions:  Allergies as of 10/27/2017      Reactions   Coconut Oil Anaphylaxis, Itching   Morphine And Related Anaphylaxis, Hives   Pt states that she has tolerated Norco and Dilaudid   Oxycodone Anaphylaxis   Ciprofloxacin Itching, Rash   Doxycycline Nausea And Vomiting   Adhesive [tape] Rash   And paper tape causes a rash if wearing for a prolong period of time   Penicillins Rash   Has patient had a PCN reaction causing immediate rash, facial/tongue/throat swelling, SOB or lightheadedness with hypotension: Yes Has patient had a PCN reaction causing severe rash involving mucus membranes or skin necrosis: No Has patient had a PCN reaction that required hospitalization No Has patient had a PCN reaction occurring within the last 10 years: No If all of the above answers are "NO", then may proceed with Cephalosporin use.      Medication List    STOP taking these medications   fluconazole 150 MG tablet Commonly known as:  DIFLUCAN   sucralfate 1 g tablet Commonly known as:  CARAFATE     TAKE these medications   acetaminophen 650 MG CR tablet Commonly known as:  TYLENOL Take 650-1,300 mg by mouth every 6 (six) hours as needed (for pain).  acetaminophen 500 MG tablet Commonly known as:  TYLENOL Take 500 mg by mouth every 6 (six) hours as needed (for pain).   albuterol 108 (90 Base) MCG/ACT inhaler Commonly known as:  PROVENTIL HFA;VENTOLIN HFA Inhale 2 puffs into the lungs every 6 (six) hours as needed for wheezing or  shortness of breath. For shortness of breath What changed:  additional instructions   CALCIUM CITRATE PO Take 10 mLs by mouth daily.   cyanocobalamin 1000 MCG/ML injection Commonly known as:  (VITAMIN B-12) Inject 1,000 mcg into the muscle every 30 (thirty) days.   EPIPEN 2-PAK 0.3 mg/0.3 mL Soaj injection Generic drug:  EPINEPHrine Inject 0.3 mg into the muscle once as needed (for severe allergic reaction).   famotidine 40 MG tablet Commonly known as:  PEPCID Take 40 mg by mouth 2 (two) times daily as needed (for heartburn/indigestion.).   FLUoxetine 10 MG tablet Commonly known as:  PROZAC Take 1 tablet (10 mg total) by mouth daily.   fluticasone 50 MCG/ACT nasal spray Commonly known as:  FLONASE Place 1 spray into both nostrils daily as needed for allergies or rhinitis. Reported on 04/01/2016   HYDROcodone-acetaminophen 5-325 MG tablet Commonly known as:  NORCO/VICODIN Take 1 tablet by mouth every 6 (six) hours as needed for up to 7 days. What changed:    when to take this  reasons to take this   hydrocortisone 25 MG suppository Commonly known as:  ANUSOL-HC Place 1 suppository (25 mg total) rectally 2 (two) times daily.   LINZESS 290 MCG Caps capsule Generic drug:  linaclotide Take 290 mcg by mouth daily before breakfast.   MULTIVITAMIN ADULT Chew Chew 1 tablet by mouth 2 (two) times daily.   ondansetron 8 MG disintegrating tablet Commonly known as:  ZOFRAN-ODT Take 1 tablet (8 mg total) by mouth every 8 (eight) hours as needed for nausea.   pantoprazole 40 MG tablet Commonly known as:  PROTONIX Take 40 mg by mouth 2 (two) times daily.   thiamine 100 MG tablet Commonly known as:  VITAMIN B-1 Take 100 mg by mouth daily.   thiamine 751 mg, folic acid 1 mg, multivitamins adult 10 mL in sodium chloride 0.9 % 1,000 mL Inject 1,000 mLs into the vein every Wednesday. [facility]       Activity: activity as tolerated Diet: Bariatric protein  shakes Wound Care: Change gauze dressing around G-tube daily  Follow-up:  With Dr. Excell Seltzer in 3 weeks.  Signed: Edward Jolly MD, FACS  10/27/2017, 7:45 AM

## 2017-10-27 NOTE — Discharge Instructions (Signed)
GASTRIC BYPASS/SLEEVE  Home Care Instructions   These instructions are to help you care for yourself when you go home.  Call: If you have any problems.  Call [phone] and ask for the surgeon on call  If you need immediate help, come to the ER at [facility].   Tell the ER staff that you are a new post-op gastric bypass or gastric sleeve patient   Signs and symptoms to report:  Severe vomiting or nausea o If you cannot keep down clear liquids for longer than 1 day, call your surgeon   Abdominal pain that does not get better after taking your pain medication  Fever over 100.4 F with chills  Heart beating over 100 beats a minute  Shortness of breath at rest  Chest pain   Redness, swelling, drainage, or foul odor at incision (surgical) sites   If your incisions open or pull apart  Swelling or pain in calf (lower leg)  Diarrhea (Loose bowel movements that happen often), frequent watery, uncontrolled bowel movements  Constipation, (no bowel movements for 3 days) if this happens: Pick one o Milk of Magnesia, 2 tablespoons by mouth, 3 times a day for 2 days if needed o Stop taking Milk of Magnesia once you have a bowel movement o Call your doctor if constipation continues Or o Miralax  (instead of Milk of Magnesia) following the label instructions o Stop taking Miralax once you have a bowel movement o Call your doctor if constipation continues  Anything you think is not normal   Normal side effects after surgery:  Unable to sleep at night or unable to focus  Irritability or moody  Being tearful (crying) or depressed These are common complaints, possibly related to your anesthesia medications that put you to sleep, stress of surgery, and change in lifestyle.  This usually goes away a few weeks after surgery.  If these feelings continue, call your primary care doctor.   Wound Care: You may have surgical glue, steri-strips, or staples over your incisions after  surgery  Surgical glue:  Looks like a clear film over your incisions and will wear off a little at a time  Steri-strips: Strips of tape over your incisions. You may notice a yellowish color on the skin under the steri-strips. This is used to make the   steri-strips stick better. Do not pull the steri-strips off - let them fall off  Staples: Staples may be removed before you leave the hospital o If you go home with staples, call [facility], [phone]) [phone] at for an appointment with your surgeons nurse to have staples removed 10 days after surgery.  Showering: You may shower two (2) days after your surgery unless your surgeon tells you differently o Wash gently around incisions with warm soapy water, rinse well, and gently pat dry  o No tub baths until staples are removed, steri-strips fall off or glue is gone.    Medications:  Medications should be liquid or crushed if larger than the size of a dime  Extended release pills (medication that release a little bit at a time through the day) should NOT be crushed or cut. (examples include XL, ER, DR, SR)  Depending on the size and number of medications you take, you may need to space (take a few throughout the day)/change the time you take your medications so that you do not over-fill your pouch (smaller stomach)  Make sure you follow-up with your primary care doctor to  make medication changes needed during rapid weight loss and life-style changes  If you have diabetes, follow up with the doctor that orders your diabetes medication(s) within one week after surgery and check your blood sugar regularly.  Do not drive while taking prescription pain medication   It is ok to take Tylenol by the bottle instructions with your pain medicine or instead of your pain medicine as needed.  DO NOT TAKE NSAIDS (EXAMPLES OF NSAIDS:  IBUPROFREN/ NAPROXEN)  Diet:                    First 2 Weeks  You will see the dietician t about two (2) weeks  after your surgery. The dietician will increase the types of foods you can eat if you are handling liquids well:  If you have severe vomiting or nausea and cannot keep down clear liquids lasting longer than 1 day, call your surgeon @ [phone]) Protein Shake  Drink at least 2 ounces of shake 5-6 times per day  Each serving of protein shakes (usually 8 - 12 ounces) should have: o 15 grams of protein  o And no more than 5 grams of carbohydrate   Goal for protein each day: o Men = 80 grams per day o Women = 60 grams per day  Protein powder may be added to fluids such as non-fat milk or Lactaid milk or unsweetened Soy/Almond milk (limit to 35 grams added protein powder per serving)  Hydration  Slowly increase the amount of water and other clear liquids as tolerated (See Acceptable Fluids)  Slowly increase the amount of protein shake as tolerated    Sip fluids slowly and throughout the day.  Do not use straws.  May use sugar substitutes in small amounts (no more than 6 - 8 packets per day; i.e. Splenda)  Fluid Goal  The first goal is to drink at least 8 ounces of protein shake/drink per day (or as directed by the nutritionist); some examples of protein shakes are [employer], [employer], EAS Edge HP, and Unjury. See handout from pre-op Bariatric Education Class: o Slowly increase the amount of protein shake you drink as tolerated o You may find it easier to slowly sip shakes throughout the day o It is important to get your proteins in first  Your fluid goal is to drink 64 - 100 ounces of fluid daily o It may take a few weeks to build up to this  32 oz (or more) should be clear liquids  And   32 oz (or more) should be full liquids (see below for examples)  Liquids should not contain sugar, caffeine, or carbonation  Clear Liquids:  Water or Sugar-free flavored water (i.e. Fruit H2O, Propel)  Decaffeinated coffee or tea (sugar-free)  Crystal Lite, Wylers Lite,  Minute Maid Lite  Sugar-free Jell-O  Bouillon or broth  Sugar-free Popsicle:   *Less than 20 calories each; Limit 1 per day  Full Liquids: Protein Shakes/Drinks + 2 choices per day of other full liquids  Full liquids must be: o No More Than 15 grams of Carbs per serving  o No More Than 3 grams of Fat per serving  Strained low-fat cream soup (except Cream of Potato or Tomato)  Non-Fat milk  Fat-free Lactaid Milk  Unsweetened Soy Or Unsweetened Almond Milk  Low Sugar yogurt (Dannon Lite & Fit, Greek yogurt; Oikos Triple Zero; Chobani Simply 100; Yoplait 100 calorie [place] - No Fruit on the Bottom)    Vitamins  and Minerals  Start 1 day after surgery unless otherwise directed by your surgeon  2 Chewable Bariatric Specific Multivitamin / Multimineral Supplement with iron (Example: Bariatric Advantage Multi EA)  Chewable Calcium with Vitamin D-3 (Example: 3 Chewable Calcium Plus 600 with Vitamin D-3) o Take 500 mg three (3) times a day for a total of 1500 mg each day o Do not take all 3 doses of calcium at one time as it may cause constipation, and you can only absorb 500 mg  at a time  o Do not mix multivitamins containing iron with calcium supplements; take 2 hours apart  Menstruating women and those with a history of anemia (a blood disease that causes weakness) may need extra iron o Talk with your doctor to see if you need more iron  Do not stop taking or change any vitamins or minerals until you talk to your dietitian or surgeon  Your Dietitian and/or surgeon must approve all vitamin and mineral supplements   Activity and Exercise: Limit your physical activity as instructed by your doctor.  It is important to continue walking at home.  During this time, use these guidelines:  Do not lift anything greater than ten (10) pounds for at least two (2) weeks  Do not go back to work or drive until Engineer, production says you can  You may have sex when you feel comfortable  o It is  VERY important for female patients to use a reliable birth control method; fertility often increases after surgery  o All hormonal birth control will be ineffective for 30 days after surgery due to medications given during surgery a barrier method must be used. o Do not get pregnant for at least 18 months  Start exercising as soon as your doctor tells you that you can o Make sure your doctor approves any physical activity  Start with a simple walking program  Walk 5-15 minutes each day, 7 days per week.   Slowly increase until you are walking 30-45 minutes per day Consider joining our [place] program. [phone] or email [email or web address] [email or web address]   Special Instructions Things to remember:  Use your CPAP when sleeping if this applies to you   [facility] has two free Bariatric Surgery Support Groups that meet monthly o The 3rd Thursday of each month, 6 pm, [facility]  o The 2nd Friday of each month, 11:45 am in the private dining room in the basement of [place]  It is very important to keep all follow up appointments with your surgeon, dietitian, primary care physician, and behavioral health practitioner  Routine follow up schedule with your surgeon include appointments at 2-3 weeks, 6-8 weeks, 6 months, and 1 year at a minimum.  Your surgeon may request to see you more often.   o After the first year, please follow up with your bariatric surgeon and dietitian at least once a year in order to maintain best weight loss results [facility]: [place]: [phone] Bariatric Nurse Coordinator: [phone]    Flush G-tube with 30 cc of water twice daily May use Osmolite 1.5,  60 cc per G-tube as needed to supplement diet     reviewed and Endorsed  by [facility] Patient Education Committee, June, 2016 Edits Approved: Aug, 2018

## 2017-10-27 NOTE — Progress Notes (Signed)
Patient alert and oriented, pain is controlled. Patient is tolerating fluids, advanced to protein shake today, patient is tolerating well. Reviewed Gastric Bypass discharge instructions with patient and patient is able to articulate understanding. Provided information on BELT program, Support Group and [facility] outpatient pharmacy. All questions answered, will continue to monitor.    

## 2017-10-27 NOTE — Progress Notes (Signed)
Nutrition Brief Note  Noted that pt is to discharge today.  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   For less volume at each bolus, can increase TF administration of Osmolite 1.5 to every 1.5 hours of 120 ml to still meet estimated needs. Smaller amounts (30 ml) of free water flushes can be administered before and after each bolus.   Per Bariatric nurse coordinator note, pt tolerating liquids at this time.   Clayton Bibles, MS, RD, [place] Dietitian Pager: [phone] After Hours Pager: [phone]

## 2017-10-27 NOTE — Progress Notes (Signed)
Patient requesting to receive IM B12 injection while in hospital; states that she missed her regular appointment d/t surgery and does not feel comfortable driving to an appointment after d/c.

## 2017-10-28 ENCOUNTER — Telehealth: Payer: Self-pay

## 2017-10-28 ENCOUNTER — Telehealth (HOSPITAL_COMMUNITY): Payer: Self-pay

## 2017-10-28 NOTE — Telephone Encounter (Signed)
Called patient to discuss hydration clinic and PICC line management.  Message left on voicemail to return call regarding the details.  Offered the information that patient no longer weekly infusions, but the appointment times would be held to change dressing and assessment of PICC line.

## 2017-10-28 NOTE — Telephone Encounter (Signed)
Transition Care Management Follow-up Telephone Call   Date discharged? 10/27/17   How have you been since you were released from the hospital? Patient states that she is doing fine, still having some pain and back spasms. She has a feeding tube and PICC line but she is also eating small amounts by mouth.    Do you understand why you were in the hospital? yes   Do you understand the discharge instructions? yes   Where were you discharged to? home   Items Reviewed:  Medications reviewed: yes  Allergies reviewed: yes  Dietary changes reviewed: yes  Referrals reviewed: yes   Functional Questionnaire:   Activities of Daily Living (ADLs):   She states they are independent in the following: ambulation, bathing and hygiene, feeding, continence, grooming, toileting and dressing States they require assistance with the following: none   Any transportation issues/concerns?: no   Any patient concerns? no   Confirmed importance and date/time of follow-up visits scheduled yes  Provider Appointment booked with Dr. Tamala Julian on 10/30/17 @ 2:40 pm.   Confirmed with patient if condition begins to worsen call PCP or go to the ER.  Patient was given the office number and encouraged to call back with question or concerns.  : yes

## 2017-10-30 ENCOUNTER — Telehealth: Payer: Self-pay | Admitting: Family Medicine

## 2017-10-30 ENCOUNTER — Ambulatory Visit (INDEPENDENT_AMBULATORY_CARE_PROVIDER_SITE_OTHER): Payer: 59 | Admitting: Family Medicine

## 2017-10-30 ENCOUNTER — Ambulatory Visit: Payer: 59 | Admitting: Family Medicine

## 2017-10-30 ENCOUNTER — Encounter: Payer: Self-pay | Admitting: Family Medicine

## 2017-10-30 VITALS — BP 108/74 | HR 68 | Temp 97.8°F | Resp 16 | Ht 67.0 in | Wt 171.0 lb

## 2017-10-30 DIAGNOSIS — E46 Unspecified protein-calorie malnutrition: Secondary | ICD-10-CM | POA: Diagnosis not present

## 2017-10-30 DIAGNOSIS — Z131 Encounter for screening for diabetes mellitus: Secondary | ICD-10-CM | POA: Diagnosis not present

## 2017-10-30 DIAGNOSIS — M545 Low back pain, unspecified: Secondary | ICD-10-CM

## 2017-10-30 DIAGNOSIS — E860 Dehydration: Secondary | ICD-10-CM | POA: Diagnosis not present

## 2017-10-30 DIAGNOSIS — Z13220 Encounter for screening for lipoid disorders: Secondary | ICD-10-CM | POA: Diagnosis not present

## 2017-10-30 DIAGNOSIS — K289 Gastrojejunal ulcer, unspecified as acute or chronic, without hemorrhage or perforation: Secondary | ICD-10-CM | POA: Diagnosis not present

## 2017-10-30 DIAGNOSIS — Z9884 Bariatric surgery status: Secondary | ICD-10-CM | POA: Diagnosis not present

## 2017-10-30 DIAGNOSIS — D508 Other iron deficiency anemias: Secondary | ICD-10-CM

## 2017-10-30 LAB — POCT URINALYSIS DIP (MANUAL ENTRY)
Blood, UA: NEGATIVE
Glucose, UA: NEGATIVE mg/dL
Leukocytes, UA: NEGATIVE
NITRITE UA: NEGATIVE
PH UA: 7 (ref 5.0–8.0)
Spec Grav, UA: 1.025 (ref 1.010–1.025)
Urobilinogen, UA: 2 E.U./dL — AB

## 2017-10-30 MED ORDER — HYDROCODONE-ACETAMINOPHEN 5-325 MG PO TABS: 1.0000 | ORAL_TABLET | Freq: Four times a day (QID) | ORAL | 0 refills | Status: DC | PRN

## 2017-10-30 MED ORDER — CYCLOBENZAPRINE HCL 5 MG PO TABS: 5.0000 mg | ORAL_TABLET | Freq: Three times a day (TID) | ORAL | 0 refills | Status: DC | PRN

## 2017-10-30 NOTE — Telephone Encounter (Signed)
I have placed form at 104 that needs to be filled out by Dr. Tamala Julian. Please advise pt when ready she will come and pick up. [phone]

## 2017-10-30 NOTE — Patient Instructions (Signed)
     IF you received an x-ray today, you will receive an invoice from [facility]. Please contact [facility] at [phone] with questions or concerns regarding your invoice.   IF you received labwork today, you will receive an invoice from [facility]. Please contact [facility] at [phone] with questions or concerns regarding your invoice.   Our billing staff will not be able to assist you with questions regarding bills from these companies.  You will be contacted with the lab results as soon as they are available. The fastest way to get your results is to activate your My Chart account. Instructions are located on the last page of this paperwork. If you have not heard from us regarding the results in 2 weeks, please contact this office.     

## 2017-10-30 NOTE — Progress Notes (Signed)
Subjective:    Patient ID: [name], female    DOB: [birth year], 42 y.o.   MRN: [id number]  10/30/2017  Transitions Of Care (Surgery, follow up from last visit, Patient has form with her from [place])    HPI This 42 y.o. female presents for evaluation of [place] for admission for revision of gastric bypass with insertion of gastrostomy tube.  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  She however has had persistent pain and nausea and a marginal ulcer that has not healed despite maximum medical management.  We therefore recommended proceeding with revision of her gastrojejunostomy with partial gastrectomy as well as placement of a feeding tube for postoperative supplementation if needed.  She is admitted following this procedure. Hospital Course: Patient underwent a laparoscopic revision of her gastrojejunostomy with resection of her distal gastric pouch leaving an approximately 4 cm pouch.  Also underwent placement of a gastrostomy tube in her gastric remnant.  Generally tolerated the procedure well.  On the first postoperative day her hemoglobin was noted to be decreased to 6.8 from preoperative of 8.5.  She was asymptomatic and vital signs stable.  She received 1 unit transfusion.  She was started on clear liquids which she tolerated well.  On the second  postoperative day her hemoglobin is appropriately increased to above 8.  Vital signs remained stable.  Pain is well controlled.  She is tolerating fluids which she says is already easier than preoperatively.  She has been instructed on care of her G-tube and this is being flushed with water.  Her abdomen is benign.  Wounds healing well.  Plan is for discharge later today if tolerating protein shakes well.  Dietary recommended tube feeding of Osmolite 1.5 totaling 5 240 mL cartons per day for full nutritional support.  Recommended 60 mL's water before and after each bolus.  Likely would not be able to tolerate that size bolus due to her small gastric remnant and for now this will be used for supplementation as needed only. Disposition: Home Patient Instructions:      Allergies as of 10/27/2017      Reactions   Coconut Oil Anaphylaxis, Itching   Morphine And Related Anaphylaxis, Hives   Pt states that she has tolerated Norco and Dilaudid   Oxycodone Anaphylaxis   Ciprofloxacin Itching, Rash   Doxycycline Nausea And Vomiting   Adhesive [tape] Rash   And paper tape causes a rash if wearing for a prolong period of time   Penicillins Rash   Has patient had a PCN reaction causing immediate rash, facial/tongue/throat swelling, SOB or lightheadedness with hypotension: Yes Has patient had a PCN reaction causing severe rash involving mucus membranes or skin necrosis: No Has patient had a PCN reaction that required hospitalization No Has patient had a PCN reaction occurring within the last 10 years: No If all of the above answers are "NO", then may proceed with Cephalosporin use.              Medication List  STOP taking these medications   fluconazole 150 MG tablet Commonly known as:  DIFLUCAN   sucralfate 1 g tablet Commonly known as:  CARAFATE     TAKE these medications   acetaminophen 650 MG CR tablet Commonly known as:  TYLENOL Take 650-1,300 mg by mouth every 6 (six)  hours as needed (for pain).   acetaminophen 500 MG tablet Commonly known as:  TYLENOL Take 500 mg by mouth every 6 (six) hours as needed (for pain).   albuterol 108 (90 Base) MCG/ACT inhaler Commonly known as:  PROVENTIL HFA;VENTOLIN HFA Inhale 2 puffs into the lungs every 6 (six) hours as needed for wheezing or shortness of breath. For shortness of breath What changed:  additional instructions   CALCIUM CITRATE PO Take 10 mLs by mouth daily.   cyanocobalamin 1000 MCG/ML injection Commonly known as:  (VITAMIN B-12) Inject 1,000 mcg into the muscle every 30 (thirty) days.   EPIPEN 2-PAK 0.3 mg/0.3 mL Soaj injection Generic drug:  EPINEPHrine Inject 0.3 mg into the muscle once as needed (for severe allergic reaction).   famotidine 40 MG tablet Commonly known as:  PEPCID Take 40 mg by mouth 2 (two) times daily as needed (for heartburn/indigestion.).   FLUoxetine 10 MG tablet Commonly known as:  PROZAC Take 1 tablet (10 mg total) by mouth daily.   fluticasone 50 MCG/ACT nasal spray Commonly known as:  FLONASE Place 1 spray into both nostrils daily as needed for allergies or rhinitis. Reported on 04/01/2016   HYDROcodone-acetaminophen 5-325 MG tablet Commonly known as:  NORCO/VICODIN Take 1 tablet by mouth every 6 (six) hours as needed for up to 7 days. What changed:    when to take this  reasons to take this   hydrocortisone 25 MG suppository Commonly known as:  ANUSOL-HC Place 1 suppository (25 mg total) rectally 2 (two) times daily.   LINZESS 290 MCG Caps capsule Generic drug:  linaclotide Take 290 mcg by mouth daily before breakfast.   MULTIVITAMIN ADULT Chew Chew 1 tablet by mouth 2 (two) times daily.   ondansetron 8 MG disintegrating tablet Commonly known as:  ZOFRAN-ODT Take 1 tablet (8 mg total) by mouth every 8 (eight) hours as needed for nausea.   pantoprazole 40 MG tablet Commonly known as:  PROTONIX Take 40 mg by mouth 2 (two) times  daily.   thiamine 100 MG tablet Commonly known as:  VITAMIN B-1 Take 100 mg by mouth daily.   thiamine 161 mg, folic acid 1 mg, multivitamins adult 10 mL in sodium chloride 0.9 % 1,000 mL Inject 1,000 mLs into the vein every Wednesday. [facility]      Activity: activity as tolerated Diet: Bariatric protein shakes Wound Care: Change gauze dressing around G-tube daily Follow-up:  With Dr. Excell Seltzer in 3 weeks.  Discharged three days ago.  No fever/chills/sweats.  Nausea with vomiting x 2; last vomit yesterday. Ate applesauce because starving.  Drinking gatorade and protein shake half of it.  Bowel movement yesterday; taking Linzess.  Had been constipation.  Pain with movement.  Hurts really badly in lower abdomen.  Follow-up with [facility] in three weeks.  Fifteen pain pills only at discharge.  Needing more.  Having back pain for two weeks that is not improved in two weeks.  Onset prior to admission. Denies radiation into legs; no n/t/w.  No saddle paresthesias.  No b/b dysfunction.  Cardiology appointment this week.  Back pain associated with chest pain.  Denies urinary symptoms yet recent  UTI.  BP Readings from Last 3 Encounters:  11/19/17 113/83  11/04/17 108/78  10/30/17 108/74   Wt Readings from Last 3 Encounters:  11/09/17 166 lb 6.4 oz (75.5 kg)  11/04/17 167 lb (75.8 kg)  10/30/17 171 lb (77.6 kg)   Immunization History  Administered Date(s) Administered  . Influenza,inj,Quad PF,6+ Mos 09/02/2015, 07/21/2016, 05/19/2017  . Influenza-Unspecified 07/30/2014  . Tdap 05/18/2015    Review of Systems  Constitutional: Negative for chills, diaphoresis, fatigue and fever.  Eyes: Negative for visual disturbance.  Respiratory: Negative for cough and shortness of breath.   Cardiovascular: Negative for chest pain, palpitations and leg swelling.  Gastrointestinal: Positive for abdominal pain. Negative for abdominal distention, anal bleeding, blood in stool,  constipation, diarrhea, nausea, rectal pain and vomiting.  Endocrine: Negative for cold intolerance, heat intolerance, polydipsia, polyphagia and polyuria.  Genitourinary: Negative for dysuria, enuresis, flank pain, frequency, hematuria, pelvic pain and urgency.  Musculoskeletal: Positive for back pain.  Neurological: Negative for dizziness, tremors, seizures, syncope, facial asymmetry, speech difficulty, weakness, light-headedness, numbness and headaches.    Past Medical History:  Diagnosis Date  . Anal fissure - posterior 10/16/2014    OCC  ISSUES  . Anxiety    doesn't take anything  . Asthma    has Albuterol inhaler as needed  . Boil    on pubic area started septra 03-01-17 draining blood and pus  . Bronchitis   . Chronic headache disorder 07/22/2016  . Clostridium difficile infection 04/20/2012  . Colitis   . Dehydration   . Depression   . Family history of adverse reaction to anesthesia    pt mom gets sick  . GERD (gastroesophageal reflux disease)    takes Pantoprazole daily  . Headache   . Hiatal hernia    neuropathy - mild in arms and legs  . History of blood transfusion    last transfusion was 04/04/2016=Benadryl was given d/t itching. States she always itches with transfusion.   Marland Kitchen History of bronchitis    > 2 yrs ago  . History of colon polyps    benign  . History of migraine    last one 05/01/16  . History of stomach ulcers   . History of urinary tract infection LAST 2 WEEKS AGO  . IDA (iron deficiency anemia)   . Internal and external bleeding hemorrhoids 06/11/2014  . Joint pain   . Joint swelling   . Left sided chronic colitis - segmental 06/11/2014  . Migraine   . Motion sickness   . Nausea    takes Zofran as needed  . Nausea and vomiting    for 1 year  . Obesity   . Oligouria   . Osteoarthritis    lower back, knees, wrists - no meds  . Pneumonia 1997  . Postoperative nausea and vomiting 01/21/2016   wants scopolamine patch  . SVD (spontaneous vaginal  delivery)    x 4  . Tingling    BOTH LOWER EXTREMETIES ALL THE TIME DUE TO RAPID WEIGHT LOSS  . TOBACCO USER 10/02/2009   Qualifier: Diagnosis of  By: Dimas Millin MD, Ellard Artis    . Transfusion history    last transfusion 6'16   . UC (ulcerative colitis) ([place])    supposed to be taking Lialda and Bentyl but has been off since gastric sleeve  . Ulcerative colitis ([place])   . Vertigo    doesn't take any meds   Past Surgical History:  Procedure Laterality Date  . ABDOMINAL HYSTERECTOMY  PARTIAL  . COLONOSCOPY  2007   for rectal bleeding; [facility]  . COLONOSCOPY N/A 06/11/2014   Procedure: COLONOSCOPY;  Surgeon: Gatha Mayer, MD;  Location: [facility];  Service: Endoscopy;  Laterality: N/A;  . COLONOSCOPY WITH PROPOFOL N/A 03/02/2017   Procedure: COLONOSCOPY WITH PROPOFOL;  Surgeon: Alphonsa Overall, MD;  Location: [facility];  Service: General;  Laterality: N/A;  . DILATION AND CURETTAGE OF UTERUS    . ESOPHAGOGASTRODUODENOSCOPY (EGD) WITH PROPOFOL N/A 04/06/2016   Procedure: ESOPHAGOGASTRODUODENOSCOPY (EGD) WITH PROPOFOL;  Surgeon: Alphonsa Overall, MD;  [facility]: [facility];  Service: General;  Laterality: N/A;  . ESOPHAGOGASTRODUODENOSCOPY (EGD) WITH PROPOFOL N/A 03/02/2017   Procedure: ESOPHAGOGASTRODUODENOSCOPY (EGD) WITH PROPOFOL;  Surgeon: Alphonsa Overall, MD;  Location: [facility];  Service: General;  Laterality: N/A;  . ESOPHAGOGASTRODUODENOSCOPY (EGD) WITH PROPOFOL N/A 05/20/2017   Procedure: ESOPHAGOGASTRODUODENOSCOPY (EGD) WITH PROPOFOL ERAS PATHWAY;  Surgeon: Alphonsa Overall, MD;  Location: [facility];  Service: General;  Laterality: N/A;  . ESOPHAGOGASTRODUODENOSCOPY (EGD) WITH PROPOFOL N/A 10/07/2017   Procedure: ESOPHAGOGASTRODUODENOSCOPY (EGD) WITH PROPOFOL;  Surgeon: Alphonsa Overall, MD;  Location: [facility];  Service: General;  Laterality: N/A;  . EXCISION OF SKIN TAG  06/08/2017   Procedure: EXCISION OF VULVAR SKIN TAGS X2;  Surgeon: [name], MD;  Location: [facility];  Service:  Gynecology;;  . FOOT SURGERY Bilateral    x 2  . GASTRIC ROUX-EN-Y N/A 12/14/2016   Procedure: LAPAROSCOPIC REVISION SLEEVE GASTRECTOMY TO  ROUX-Y-GASTRIC BY-PASS, UPPER ENDO;  Surgeon: Excell Seltzer, MD;  Location: [facility];  Service: General;  Laterality: N/A;  . GASTROJEJUNOSTOMY N/A 05/11/2016   Procedure: LAPAROSCOPIC PLACEMENT  OF FEEDING  JEJUNOSTOMY TUBE;  Surgeon: Excell Seltzer, MD;  Location: [facility];  Service: General;  Laterality: N/A;  . HEMORRHOIDECTOMY WITH HEMORRHOID BANDING    . IR FLUORO GUIDE CV LINE RIGHT  04/06/2017  . IR GENERIC HISTORICAL  05/19/2016   IR CM INJ ANY COLONIC TUBE W/FLUORO 05/19/2016 Aletta Edouard, MD [facility]  . IR US GUIDE VASC ACCESS RIGHT  04/06/2017  . j tube removed march 2018    . KNEE ARTHROSCOPY Left 09/06/2014  . LAPAROSCOPIC GASTRIC SLEEVE RESECTION N/A 01/14/2016   Procedure: LAPAROSCOPIC GASTRIC SLEEVE RESECTION;  Surgeon: Excell Seltzer, MD;  Location: [facility];  Service: General;  Laterality: N/A;  . LAPAROSCOPIC REVISION OF GASTROJEJUNOSTOMY N/A 10/25/2017   Procedure: LAPAROSCOPIC REVISION OF GASTROJEJUNOSTOMY AND PARTIAL GASTRECTOMY, WITH PLACEMENT OF FEEDING GASTROSTOMY TUBE;  Surgeon: Excell Seltzer, MD;  Location: [facility];  Service: General;  Laterality: N/A;  . LAPAROSCOPIC TUBAL LIGATION  10/16/2011   Procedure: LAPAROSCOPIC TUBAL LIGATION;  Surgeon: Alwyn Pea, MD;  Location: [facility];  Service: Gynecology;  Laterality: Bilateral;  . NOVASURE ABLATION  09/28/2010   mild persistent vaginal bleeding  . right knee arthroscopy     05/07/2016  . TUBAL LIGATION    . VAGINAL HYSTERECTOMY Bilateral 06/08/2017   Procedure: HYSTERECTOMY VAGINAL W/ BILATERAL SALPINGECTOMY;  Surgeon: [name], MD;  Location: [facility];  Service: Gynecology;  Laterality: Bilateral;  . wisdom teeth extracted     Allergies  Allergen Reactions  . Coconut Oil Anaphylaxis and Itching  . Morphine And Related Anaphylaxis and Hives    Pt states that  she has tolerated Norco and Dilaudid  . Oxycodone Anaphylaxis  . Ciprofloxacin Itching and Rash  . Doxycycline Nausea And Vomiting  . Adhesive [Tape] Rash    And paper tape causes a rash if wearing for a prolong  period of time  . Penicillins Rash    Has patient had a PCN reaction causing immediate rash, facial/tongue/throat swelling, SOB or lightheadedness with hypotension: Yes Has patient had a PCN reaction causing severe rash involving mucus membranes or skin necrosis: No Has patient had a PCN reaction that required hospitalization No Has patient had a PCN reaction occurring within the last 10 years: No If all of the above answers are "NO", then may proceed with Cephalosporin use.   Current Outpatient Medications on File Prior to Visit  Medication Sig Dispense Refill  . albuterol (PROVENTIL HFA;VENTOLIN HFA) 108 (90 Base) MCG/ACT inhaler Inhale 2 puffs into the lungs every 6 (six) hours as needed for wheezing or shortness of breath. For shortness of breath (Patient taking differently: Inhale 2 puffs into the lungs every 6 (six) hours as needed for wheezing or shortness of breath. ) 54 g 0  . CALCIUM CITRATE PO Take 10 mLs by mouth daily.    Marland Kitchen FLUoxetine (PROZAC) 10 MG tablet Take 1 tablet (10 mg total) by mouth daily. 30 tablet 3  . hydrocortisone (ANUSOL-HC) 25 MG suppository Place 1 suppository (25 mg total) rectally 2 (two) times daily. 12 suppository 0  . linaclotide (LINZESS) 290 MCG CAPS capsule Take 290 mcg by mouth daily before breakfast.    . Multiple Vitamins-Minerals (MULTIVITAMIN ADULT) CHEW Chew 1 tablet by mouth 2 (two) times daily.    Marland Kitchen thiamine (VITAMIN B-1) 100 MG tablet Take 100 mg by mouth daily.     . cyanocobalamin (,VITAMIN B-12,) 1000 MCG/ML injection Inject 1,000 mcg into the muscle every 30 (thirty) days.     Current Facility-Administered Medications on File Prior to Visit  Medication Dose Route Frequency Provider Last Rate Last Dose  . cyanocobalamin ((VITAMIN  B-12)) injection 1,000 mcg  1,000 mcg Intramuscular Q30 days Wardell Honour, MD   1,000 mcg at 06/22/17 1537  . cyanocobalamin ((VITAMIN B-12)) injection 1,000 mcg  1,000 mcg Intramuscular Q30 days Wardell Honour, MD   1,000 mcg at 09/22/17 1834  . ondansetron (ZOFRAN-ODT) disintegrating tablet 4 mg  4 mg Oral Q4H PRN Excell Seltzer, MD       Or  . ondansetron (ZOFRAN) 4 mg in sodium chloride 0.9 % 50 mL IVPB  4 mg Intravenous Q4H PRN Excell Seltzer, MD   4 mg at 05/20/17 0813  . sodium chloride 0.9 % 1,000 mL with thiamine 540 mg, folic acid 1 mg, multivitamins adult 10 mL infusion   Intravenous Once Excell Seltzer, MD      . sodium chloride 0.9 % 1,000 mL with thiamine 981 mg, folic acid 1 mg, multivitamins adult 10 mL infusion   Intravenous Continuous Excell Seltzer, MD       Social History   Socioeconomic History  . Marital status: Single    Spouse name: Not on file  . Number of children: 3  . Years of education: Some college  . Highest education level: Not on file  Social Needs  . Financial resource strain: Not on file  . Food insecurity - worry: Not on file  . Food insecurity - inability: Not on file  . Transportation needs - medical: Not on file  . Transportation needs - non-medical: Not on file  Occupational History  . Occupation: Production designer, theatre/television/film    Comment: Currently out on disability d/t surg  Tobacco Use  . Smoking status: Former Smoker    Packs/day: 0.50    Years: 20.00    Pack years:  10.00    Types: Cigarettes    Last attempt to quit: 03/17/2014    Years since quitting: 3.6  . Smokeless tobacco: Never Used  Substance and Sexual Activity  . Alcohol use: No    Alcohol/week: 0.0 oz  . Drug use: No  . Sexual activity: Not Currently    Birth control/protection: Surgical  Other Topics Concern  . Not on file  Social History Narrative   Marital status:  Single; not dating seriously.      Children:  3 sons (8, 46, 1) whose father is deceased, 1  daughter (deceased); no grandchildren      Lives:  With 2 sons      Employment:  Long term disability in 2018 due to PICC line for iv fluids; Corporate treasurer at [employer]      Tobacco: quit June 2015      Alcohol:  None      Drugs:  None      Exercise:  Leg lifts for knee strengthening; walking   1 caffeine beverage daily      Sexual activity:  Active; total partners = up there.  No STDs in past.        Right-handed   Family History  Problem Relation Age of Onset  . Hypertension Mother   . Diabetes Mother   . Sarcoidosis Mother        lungs and skin  . Asthma Mother   . Hypertension Father   . Diabetes Father   . Asthma Son   . Tics Son   . Asthma Sister   . Cancer Sister        possible pancreatic cancer  . Adrenal disorder Sister        Tumor   . Asthma Brother   . Asthma Daughter        died age 54.5  . Cancer Daughter 4       brain; died age 54.5  . Asthma Son   . Cancer Paternal Aunt        brain, colon, lung and esophagus; unsure of primary        Objective:    BP 108/74   Pulse 68   Temp 97.8 F (36.6 C) (Oral)   Resp 16   Ht 5' 7"  (1.702 m)   Wt 171 lb (77.6 kg)   LMP 06/02/2017 Comment: hysterectomy 06/08/2017  SpO2 100%   BMI 26.78 kg/m  Physical Exam  Constitutional: She is oriented to person, place, and time. She appears well-developed and well-nourished. No distress.  HENT:  Head: Normocephalic and atraumatic.  Right Ear: External ear normal.  Left Ear: External ear normal.  Nose: Nose normal.  Mouth/Throat: Oropharynx is clear and moist.  Eyes: Conjunctivae and EOM are normal. Pupils are equal, round, and reactive to light.  Neck: Normal range of motion. Neck supple. Carotid bruit is not present. No thyromegaly present.  Cardiovascular: Normal rate, regular rhythm, normal heart sounds and intact distal pulses. Exam reveals no gallop and no friction rub.  No murmur heard. Pulmonary/Chest: Effort normal and breath sounds normal. She has  no wheezes. She has no rales.  Abdominal: Soft. Bowel sounds are normal. She exhibits no distension and no mass. There is tenderness. There is no rebound and no guarding.  Slight yellow green drainage from feeding tube site. No odor.  Musculoskeletal:       Lumbar back: She exhibits tenderness and pain. She exhibits no bony tenderness.  +TTP paraspinal regions  B lumbar region; straight leg raises negative; motor 5/5 BLE.  Lymphadenopathy:    She has no cervical adenopathy.  Neurological: She is alert and oriented to person, place, and time. No cranial nerve deficit.  Skin: Skin is warm and dry. No rash noted. She is not diaphoretic. No erythema. No pallor.  Psychiatric: She has a normal mood and affect. Her behavior is normal.   No results found. Depression screen [facility] 2/9 09/22/2017 07/26/2017 06/23/2017 06/22/2017 05/19/2017  Decreased Interest 1 0 2 0 2  Down, Depressed, Hopeless 1 0 2 0 2  PHQ - 2 Score 2 0 4 0 4  Altered sleeping 1 - 2 - 2  Tired, decreased energy 1 - 2 - 3  Change in appetite 0 - 3 - 3  Feeling bad or failure about yourself  1 - 1 - 1  Trouble concentrating 0 - 0 - 3  Moving slowly or fidgety/restless 0 - 0 - 0  Suicidal thoughts 0 - - - 0  PHQ-9 Score 5 - 12 - 16  Difficult doing work/chores - - - - Somewhat difficult  Some recent data might be hidden   Fall Risk  10/30/2017 09/22/2017 07/26/2017 06/22/2017 05/19/2017  Falls in the past year? No Yes Yes Yes Yes  Number falls in past yr: - 2 or more 2 or more 2 or more 2 or more  Injury with Fall? - No - No No  Comment - - - - no injury just bruises  Follow up - - - Falls evaluation completed -        Assessment & Plan:   1. Marginal ulcer   2. Malnutrition, calorie ([place])   3. Acute right-sided low back pain without sciatica   4. Dehydration   5. S/P laparoscopic sleeve gastrectomy   6. Iron deficiency anemia secondary to inadequate dietary iron intake   7. Screening for diabetes mellitus   8. Screening,  lipid    Admission notes reviewed in detail during visit.  Status post revision of gastrojunostomy and partial gastrectomy with placement of feeding tube.  Doing well on postoperative.  Mild drainage from feeding tube.  Recommend monitoring closely and contact surgeon if increased drainage occurs or development of fever.  Tolerating p.o. intake well at this time. Repeat H/H today; warranted transfusion in post-operative period.  Refill hydrocodone per patient request.  Hospital records reviewed in detail during visit.   Unhealing gastric ulcer status post partial gastrectomy.  Continue Protonix for general surgery and gastroenterology.  Suffering with malnutrition and chronic dehydration due to chronic nausea and vomiting secondary to unhealing ulcer.  Now with placement of feeding tube for nutritional status and hydration.  No longer should warrant IV hydration.  Very hopeful for patient for improvement clinically.  New onset right lower back pain without sciatica: Recommend stretching, heat, muscle relaxer.  Recommend frequent ambulation.  Consistent with musculoskeletal etiology.  Return to clinic in 1 month if no improvement for imaging studies.  Recent UTI: Send urine culture considering current lower back pain; however, atypical presentation for recurrent UTI.  Increase hydration should improve infection rate in the urine.  Patient is requesting screening lipid and screening for diabetes her employer request.  Screening labs obtained.  -prolonged face-to-face for 40 minutes with greater than 50% of time dedicated to counseling and coordination of care.  Orders Placed This Encounter  Procedures  . Urine Culture  . Lipid panel    Order Specific Question:   Has the patient  fasted?    Answer:   No  . CBC with Differential/Platelet  . Comprehensive metabolic panel    Order Specific Question:   Has the patient fasted?    Answer:   No  . POCT urinalysis dipstick   Meds ordered this  encounter  Medications  . cyclobenzaprine (FLEXERIL) 5 MG tablet    Sig: Take 1 tablet (5 mg total) by mouth 3 (three) times daily as needed for muscle spasms.    Dispense:  30 tablet    Refill:  0  . DISCONTD: HYDROcodone-acetaminophen (NORCO/VICODIN) 5-325 MG tablet    Sig: Take 1 tablet by mouth every 6 (six) hours as needed for moderate pain.    Dispense:  15 tablet    Refill:  0    No Follow-up on file.   Twinkle Sockwell Elayne Guerin, M.D. Primary Care at [facility] [address] [place], [place]  [address] [phone] phone [phone] fax

## 2017-10-31 LAB — COMPREHENSIVE METABOLIC PANEL
A/G RATIO: 1.3 (ref 1.2–2.2)
ALBUMIN: 4.4 g/dL (ref 3.5–5.5)
ALK PHOS: 70 IU/L (ref 39–117)
ALT: 20 IU/L (ref 0–32)
AST: 21 IU/L (ref 0–40)
BUN/Creatinine Ratio: 17 (ref 9–23)
BUN: 12 mg/dL (ref 6–20)
Bilirubin Total: 0.2 mg/dL (ref 0.0–1.2)
CO2: 20 mmol/L (ref 20–29)
CREATININE: 0.7 mg/dL (ref 0.57–1.00)
Calcium: 9.3 mg/dL (ref 8.7–10.2)
Chloride: 103 mmol/L (ref 96–106)
GFR calc Af Amer: 126 mL/min/{1.73_m2} (ref >59)
GFR, EST NON AFRICAN AMERICAN: 109 mL/min/{1.73_m2} (ref >59)
Globulin, Total: 3.3 g/dL (ref 1.5–4.5)
Glucose: 75 mg/dL (ref 65–99)
Potassium: 4.1 mmol/L (ref 3.5–5.2)
SODIUM: 140 mmol/L (ref 134–144)
Total Protein: 7.7 g/dL (ref 6.0–8.5)

## 2017-10-31 LAB — CBC WITH DIFFERENTIAL/PLATELET
Basophils Absolute: 0 10*3/uL (ref 0.0–0.2)
Basos: 0 %
EOS (ABSOLUTE): 0.5 10*3/uL — ABNORMAL HIGH (ref 0.0–0.4)
Eos: 7 %
HEMOGLOBIN: 10.1 g/dL — AB (ref 11.1–15.9)
Hematocrit: 33 % — ABNORMAL LOW (ref 34.0–46.6)
Immature Grans (Abs): 0 10*3/uL (ref 0.0–0.1)
Immature Granulocytes: 0 %
LYMPHS ABS: 3 10*3/uL (ref 0.7–3.1)
Lymphs: 41 %
MCH: 23.4 pg — ABNORMAL LOW (ref 26.6–33.0)
MCHC: 30.6 g/dL — AB (ref 31.5–35.7)
MCV: 77 fL — ABNORMAL LOW (ref 79–97)
MONOCYTES: 5 %
Monocytes Absolute: 0.4 10*3/uL (ref 0.1–0.9)
NEUTROS ABS: 3.4 10*3/uL (ref 1.4–7.0)
Neutrophils: 47 %
Platelets: 329 10*3/uL (ref 150–379)
RBC: 4.31 x10E6/uL (ref 3.77–5.28)
RDW: 18.4 % — ABNORMAL HIGH (ref 12.3–15.4)
WBC: 7.3 10*3/uL (ref 3.4–10.8)

## 2017-10-31 LAB — URINE CULTURE: Organism ID, Bacteria: NO GROWTH

## 2017-10-31 LAB — LIPID PANEL
CHOLESTEROL TOTAL: 168 mg/dL (ref 100–199)
Chol/HDL Ratio: 2.8 ratio (ref 0.0–4.4)
HDL: 60 mg/dL (ref >39)
LDL Calculated: 88 mg/dL (ref 0–99)
Triglycerides: 98 mg/dL (ref 0–149)
VLDL CHOLESTEROL CAL: 20 mg/dL (ref 5–40)

## 2017-11-03 ENCOUNTER — Ambulatory Visit (HOSPITAL_COMMUNITY)
Admission: RE | Admit: 2017-11-03 | Discharge: 2017-11-03 | Disposition: A | Discharge status: Home / Self Care | Payer: 59 | Source: Ambulatory Visit | Attending: General Surgery | Admitting: General Surgery

## 2017-11-03 DIAGNOSIS — Z452 Encounter for adjustment and management of vascular access device: Secondary | ICD-10-CM | POA: Insufficient documentation

## 2017-11-03 DIAGNOSIS — K289 Gastrojejunal ulcer, unspecified as acute or chronic, without hemorrhage or perforation: Secondary | ICD-10-CM | POA: Diagnosis not present

## 2017-11-03 MED ORDER — SODIUM CHLORIDE 0.9% FLUSH
10.0000 mL | INTRAVENOUS | Status: AC | PRN
  Administered 2017-11-03: 10 mL

## 2017-11-03 MED ORDER — HEPARIN SOD (PORK) LOCK FLUSH 100 UNIT/ML IV SOLN
250.0000 [IU] | INTRAVENOUS | Status: AC | PRN
  Administered 2017-11-03: 250 [IU]
  Filled 2017-11-03: qty 5

## 2017-11-03 NOTE — Discharge Instructions (Signed)
PICC line dressing changed and PICC flushed.

## 2017-11-03 NOTE — Progress Notes (Signed)
[name] came into the Patient [name] for her weekly central line flush and dressing change. Patient tolerated well.  Discharge instructions given, patient verbalized understanding. Patient alert, oriented, verbal and ambulatory at time of discharge.

## 2017-11-04 ENCOUNTER — Encounter: Payer: Self-pay | Admitting: Cardiology

## 2017-11-04 ENCOUNTER — Ambulatory Visit (INDEPENDENT_AMBULATORY_CARE_PROVIDER_SITE_OTHER): Payer: 59 | Admitting: Cardiology

## 2017-11-04 VITALS — BP 108/78 | HR 95 | Ht 66.0 in | Wt 167.0 lb

## 2017-11-04 DIAGNOSIS — R000 Tachycardia, unspecified: Secondary | ICD-10-CM | POA: Diagnosis not present

## 2017-11-04 DIAGNOSIS — R072 Precordial pain: Secondary | ICD-10-CM

## 2017-11-04 DIAGNOSIS — M25511 Pain in right shoulder: Secondary | ICD-10-CM | POA: Insufficient documentation

## 2017-11-04 DIAGNOSIS — Z01818 Encounter for other preprocedural examination: Secondary | ICD-10-CM | POA: Diagnosis not present

## 2017-11-04 DIAGNOSIS — R079 Chest pain, unspecified: Secondary | ICD-10-CM | POA: Diagnosis not present

## 2017-11-04 MED ORDER — METOPROLOL TARTRATE 50 MG PO TABS: 50.0000 mg | ORAL_TABLET | Freq: Once | ORAL | 0 refills | Status: DC

## 2017-11-04 NOTE — Progress Notes (Signed)
PCP: Wardell Honour, MD  Clinic Note: Chief Complaint  Patient presents with  . New Patient (Initial Visit)    Chest pain    HPI: [name] is a 42 y.o. female with a PMH below who is seen today for evaluation of chest pain and palpitations at the request of Wardell Honour, MD. -[name] has a relatively complicated medical history involving history -mostly related to complex bariatric surgery history --  - gastric sleeve back in 2017 followed by Roux-en-Y revision in 2018 followed by laparoscopic revision of the jejunostomy 2019 with gastric bypass finally in January 2019 involving placement of a gastrostomy feeding tube and right upper extremity PICC line. --[name] has had persistent abdominal discomfort as a result of all her surgeries.   -[name] has had postop anemia requiring at least 1 unit transfusion.  --[name] was able to make do all these procedures without having any cardiac issues.  [name] was seen on February 2 by Chaya Jan, MD For "Transition of Care "follow-up.  At that time it was noted that [name] was scheduled to see cardiology for chest pain and back pain.  The patient did not describe it at that time.  Recent Hospitalizations:   January 28-to 30, 2019 -laparoscopic revision of gastrojejunostomy and partial gastrectomy with placement of feeding gastrostomy tube d jejunostomy  Studies Personally Reviewed - (if available, images/films reviewed: From Epic Chart or Care Everywhere)  None  Interval History: [name] presents here today for evaluation really describing pain in the central to right chest radiating to the right arm and shoulder.  [name] says is been off and on now for couple months.  [name] is able to carry on with the symptoms present, and does not necessarily notice that they are worse or better with any particular activity.  [name] says that [name] was there all day long but more often present than not.  Is usually worse with sitting up for a while, for  instance sitting in the examining table today [name] notes this discomfort radiating down both arms really with numbness.  Certain movements will exacerbate her symptoms, but not particularly walking unless [name] is swinging her arms.  The main thing [name] does try to make it go away as if [name] is lies down completely still and tries to fall asleep.  [name] will sometimes then wake up without the pain and then it will stay away for a while.  Once [name] is back up again doing her routine activities, even just sitting up in a chair, the pain recurs.  [name] has occasional episodes where [name] feels her heart rate going up off and on but attributes that more to being anxious.  This really happens only about once or twice a week, and [name] is quite adamant that [name] would not want to wear monitor.  [name] has not had any lightheadedness, dizziness or syncope/near syncope associated with it.  [name] does note that [name] has a right brachial PICC line present that was placed quite some time ago.  [name] has not been able to be very active having recovered from her recent surgery, but has not had any exertional dyspnea besides being deconditioned.  The chest discomfort is also not exertional. No PND, orthopnea or edema. No TIA/amaurosis fugax symptoms. No claudication.  ROS: A comprehensive was performed. Review of Systems  Constitutional: Positive for malaise/fatigue (Gradually building her energy back.  Has had nutrition and anemia issues).  HENT: Negative for congestion and nosebleeds.  Respiratory: Negative for cough, shortness of breath and wheezing.   Cardiovascular: Negative for leg swelling.  Gastrointestinal: Negative for blood in stool and melena.  Genitourinary: Negative for hematuria.  Musculoskeletal: Positive for joint pain (Right shoulder).  Psychiatric/Behavioral: The patient is not nervous/anxious and does not have insomnia.   All other systems reviewed and are negative.   I have reviewed and (if needed)  personally updated the patient's problem list, medications, allergies, past medical and surgical history, social and family history.   Past Medical History:  Diagnosis Date  . Anal fissure - posterior 10/16/2014    OCC  ISSUES  . Anxiety    doesn't take anything  . Asthma    has Albuterol inhaler as needed  . Boil    on pubic area started septra 03-01-17 draining blood and pus  . Bronchitis   . Chronic headache disorder 07/22/2016  . Clostridium difficile infection 04/20/2012  . Colitis   . Dehydration   . Depression   . Family history of adverse reaction to anesthesia    pt mom gets sick  . GERD (gastroesophageal reflux disease)    takes Pantoprazole daily  . Headache   . Hiatal hernia    neuropathy - mild in arms and legs  . History of blood transfusion    last transfusion was 04/04/2016=Benadryl was given d/t itching. States [name] always itches with transfusion.   Marland Kitchen History of bronchitis    > 2 yrs ago  . History of colon polyps    benign  . History of migraine    last one 05/01/16  . History of stomach ulcers   . History of urinary tract infection LAST 2 WEEKS AGO  . IDA (iron deficiency anemia)   . Internal and external bleeding hemorrhoids 06/11/2014  . Joint pain   . Joint swelling   . Left sided chronic colitis - segmental 06/11/2014  . Migraine   . Motion sickness   . Nausea    takes Zofran as needed  . Nausea and vomiting    for 1 year  . Obesity   . Oligouria   . Osteoarthritis    lower back, knees, wrists - no meds  . Pneumonia 1997  . Postoperative nausea and vomiting 01/21/2016   wants scopolamine patch  . SVD (spontaneous vaginal delivery)    x 4  . Tingling    BOTH LOWER EXTREMETIES ALL THE TIME DUE TO RAPID WEIGHT LOSS  . TOBACCO USER 10/02/2009   Qualifier: Diagnosis of  By: Dimas Millin MD, Ellard Artis    . Transfusion history    last transfusion 6'16   . UC (ulcerative colitis) ([place])    supposed to be taking Lialda and Bentyl but has been off since gastric  sleeve  . Ulcerative colitis ([place])   . Vertigo    doesn't take any meds    Past Surgical History:  Procedure Laterality Date  . ABDOMINAL HYSTERECTOMY     PARTIAL  . COLONOSCOPY  2007   for rectal bleeding; [facility]  . COLONOSCOPY N/A 06/11/2014   Procedure: COLONOSCOPY;  Surgeon: Gatha Mayer, MD;  Location: [facility];  Service: Endoscopy;  Laterality: N/A;  . COLONOSCOPY WITH PROPOFOL N/A 03/02/2017   Procedure: COLONOSCOPY WITH PROPOFOL;  Surgeon: Alphonsa Overall, MD;  Location: [facility];  Service: General;  Laterality: N/A;  . DILATION AND CURETTAGE OF UTERUS    . ESOPHAGOGASTRODUODENOSCOPY (EGD) WITH PROPOFOL N/A 04/06/2016   Procedure: ESOPHAGOGASTRODUODENOSCOPY (EGD) WITH PROPOFOL;  Surgeon: Alphonsa Overall,  MD;  [facility]: [facility];  Service: General;  Laterality: N/A;  . ESOPHAGOGASTRODUODENOSCOPY (EGD) WITH PROPOFOL N/A 03/02/2017   Procedure: ESOPHAGOGASTRODUODENOSCOPY (EGD) WITH PROPOFOL;  Surgeon: Alphonsa Overall, MD;  [facility];  Service: General;  Laterality: N/A;  . ESOPHAGOGASTRODUODENOSCOPY (EGD) WITH PROPOFOL N/A 05/20/2017   Procedure: ESOPHAGOGASTRODUODENOSCOPY (EGD) WITH PROPOFOL ERAS PATHWAY;  Surgeon: Alphonsa Overall, MD;  Location: [facility];  Service: General;  Laterality: N/A;  . ESOPHAGOGASTRODUODENOSCOPY (EGD) WITH PROPOFOL N/A 10/07/2017   Procedure: ESOPHAGOGASTRODUODENOSCOPY (EGD) WITH PROPOFOL;  Surgeon: Alphonsa Overall, MD;  Location: [facility];  Service: General;  Laterality: N/A;  . EXCISION OF SKIN TAG  06/08/2017   Procedure: EXCISION OF VULVAR SKIN TAGS X2;  Surgeon: [name], MD;  Location: [facility];  Service: Gynecology;;  . FOOT SURGERY Bilateral    x 2  . GASTRIC ROUX-EN-Y N/A 12/14/2016   Procedure: LAPAROSCOPIC REVISION SLEEVE GASTRECTOMY TO  ROUX-Y-GASTRIC BY-PASS, UPPER ENDO;  Surgeon: Excell Seltzer, MD;  Location: [facility];  Service: General;  Laterality: N/A;  . GASTROJEJUNOSTOMY N/A 05/11/2016   Procedure: LAPAROSCOPIC  PLACEMENT  OF FEEDING  JEJUNOSTOMY TUBE;  Surgeon: Excell Seltzer, MD;  Location: [facility];  Service: General;  Laterality: N/A;  . HEMORRHOIDECTOMY WITH HEMORRHOID BANDING    . IR FLUORO GUIDE CV LINE RIGHT  04/06/2017  . IR GENERIC HISTORICAL  05/19/2016   IR CM INJ ANY COLONIC TUBE W/FLUORO 05/19/2016 Aletta Edouard, MD [facility]  . IR US GUIDE VASC ACCESS RIGHT  04/06/2017  . j tube removed march 2018    . KNEE ARTHROSCOPY Left 09/06/2014  . LAPAROSCOPIC GASTRIC SLEEVE RESECTION N/A 01/14/2016   Procedure: LAPAROSCOPIC GASTRIC SLEEVE RESECTION;  Surgeon: Excell Seltzer, MD;  [facility];  Service: General;  Laterality: N/A;  . LAPAROSCOPIC REVISION OF GASTROJEJUNOSTOMY N/A 10/25/2017   Procedure: LAPAROSCOPIC REVISION OF GASTROJEJUNOSTOMY AND PARTIAL GASTRECTOMY, WITH PLACEMENT OF FEEDING GASTROSTOMY TUBE;  Surgeon: Excell Seltzer, MD;  Location: [facility];  Service: General;  Laterality: N/A;  . LAPAROSCOPIC TUBAL LIGATION  10/16/2011   Procedure: LAPAROSCOPIC TUBAL LIGATION;  Surgeon: Alwyn Pea, MD;  Location: [facility];  Service: Gynecology;  Laterality: Bilateral;  . NOVASURE ABLATION  09/28/2010   mild persistent vaginal bleeding  . right knee arthroscopy     05/07/2016  . TUBAL LIGATION    . VAGINAL HYSTERECTOMY Bilateral 06/08/2017   Procedure: HYSTERECTOMY VAGINAL W/ BILATERAL SALPINGECTOMY;  Surgeon: [name], MD;  Location: [facility];  Service: Gynecology;  Laterality: Bilateral;  . wisdom teeth extracted      Current Meds  Medication Sig  . acetaminophen (TYLENOL) 650 MG CR tablet Take 650-1,300 mg by mouth every 6 (six) hours as needed (for pain).   Marland Kitchen albuterol (PROVENTIL HFA;VENTOLIN HFA) 108 (90 Base) MCG/ACT inhaler Inhale 2 puffs into the lungs every 6 (six) hours as needed for wheezing or shortness of breath. For shortness of breath (Patient taking differently: Inhale 2 puffs into the lungs every 6 (six) hours as needed for wheezing or shortness of breath. )    . CALCIUM CITRATE PO Take 10 mLs by mouth daily.  . cyanocobalamin (,VITAMIN B-12,) 1000 MCG/ML injection Inject 1,000 mcg into the muscle every 30 (thirty) days.  . cyclobenzaprine (FLEXERIL) 5 MG tablet Take 1 tablet (5 mg total) by mouth 3 (three) times daily as needed for muscle spasms.  Marland Kitchen EPINEPHrine (EPIPEN 2-PAK) 0.3 mg/0.3 mL IJ SOAJ injection Inject 0.3 mg into the muscle once as needed (for severe allergic  reaction).  Marland Kitchen FLUoxetine (PROZAC) 10 MG tablet Take 1 tablet (10 mg total) by mouth daily.  . fluticasone (FLONASE) 50 MCG/ACT nasal spray Place 1 spray into both nostrils daily as needed for allergies or rhinitis. Reported on 04/01/2016  . hydrocortisone (ANUSOL-HC) 25 MG suppository Place 1 suppository (25 mg total) rectally 2 (two) times daily.  Marland Kitchen linaclotide (LINZESS) 290 MCG CAPS capsule Take 290 mcg by mouth daily before breakfast.  . Multiple Vitamins-Minerals (MULTIVITAMIN ADULT) CHEW Chew 1 tablet by mouth 2 (two) times daily.  . ondansetron (ZOFRAN-ODT) 8 MG disintegrating tablet Take 1 tablet (8 mg total) by mouth every 8 (eight) hours as needed for nausea.  Marland Kitchen thiamine (VITAMIN B-1) 100 MG tablet Take 100 mg by mouth daily.    Current Facility-Administered Medications for the 11/04/17 encounter (Office Visit) with Leonie Man, MD  Medication  . cyanocobalamin ((VITAMIN B-12)) injection 1,000 mcg  . cyanocobalamin ((VITAMIN B-12)) injection 1,000 mcg    Allergies  Allergen Reactions  . Coconut Oil Anaphylaxis and Itching  . Morphine And Related Anaphylaxis and Hives    Pt states that [name] has tolerated Norco and Dilaudid  . Oxycodone Anaphylaxis  . Ciprofloxacin Itching and Rash  . Doxycycline Nausea And Vomiting  . Adhesive [Tape] Rash    And paper tape causes a rash if wearing for a prolong period of time  . Penicillins Rash    Has patient had a PCN reaction causing immediate rash, facial/tongue/throat swelling, SOB or lightheadedness with hypotension: Yes Has  patient had a PCN reaction causing severe rash involving mucus membranes or skin necrosis: No Has patient had a PCN reaction that required hospitalization No Has patient had a PCN reaction occurring within the last 10 years: No If all of the above answers are "NO", then may proceed with Cephalosporin use.    Social History   Tobacco Use  . Smoking status: Former Smoker    Packs/day: 0.50    Years: 20.00    Pack years: 10.00    Types: Cigarettes    Last attempt to quit: 03/17/2014    Years since quitting: 3.6  . Smokeless tobacco: Never Used  Substance Use Topics  . Alcohol use: No    Alcohol/week: 0.0 oz  . Drug use: No   Social History   Social History Narrative   Marital status:  Single; not dating seriously.      Children:  3 sons (44, 54, 61) whose father is deceased, 1 daughter (deceased); no grandchildren      Lives:  With 2 sons      Employment:  Long term disability in 2018 due to PICC line for iv fluids; Corporate treasurer at [employer]      Tobacco: quit June 2015      Alcohol:  None      Drugs:  None      Exercise:  Leg lifts for knee strengthening; walking   1 caffeine beverage daily      Sexual activity:  Active; total partners = up there.  No STDs in past.        Right-handed    family history includes Adrenal disorder in her sister; Asthma in her brother, daughter, mother, sister, son, and son; Cancer in her paternal aunt and sister; Cancer (age of onset: 71) in her daughter; Diabetes in her father and mother; Hypertension in her father and mother; Sarcoidosis in her mother; Tics in her son.  Wt Readings from Last 3 Encounters:  11/04/17 167 lb (75.8 kg)  10/30/17 171 lb (77.6 kg)  10/26/17 183 lb 3.2 oz (83.1 kg)    PHYSICAL EXAM BP 108/78   Pulse 95   Ht 5' 6"  (1.676 m)   Wt 167 lb (75.8 kg)   LMP 06/02/2017 Comment: hysterectomy 06/08/2017  SpO2 98%   BMI 26.95 kg/m  Physical Exam  Constitutional: [name] is oriented to person, place, and time.  [name] appears well-developed.  Somewhat sickly appearing woman who has clearly lost a significant amount of weight from her gastric bypass.  Somewhat frail, but not toxic appearing.  Well-groomed.  HENT:  Head: Normocephalic and atraumatic.  Eyes: Conjunctivae and EOM are normal. Pupils are equal, round, and reactive to light. No scleral icterus.  Neck: Normal range of motion. Neck supple. No JVD present.  Cardiovascular: Normal rate, regular rhythm, normal heart sounds and intact distal pulses. Exam reveals no gallop.  No murmur heard. Pulmonary/Chest: Effort normal and breath sounds normal. No respiratory distress. [name] has no wheezes. [name] exhibits tenderness ([name] does have tenderness along the right clavicle and up into the neck.  Also along the sternal border.).  Abdominal: Soft.  Somewhat hyperactive bowel sounds.  Gastrostomy tube in place.  Diffusely tender postop.  Musculoskeletal: Normal range of motion. [name] exhibits no edema.  Neurological: [name] is alert and oriented to person, place, and time. No cranial nerve deficit.  Skin: Skin is warm and dry.  Psychiatric: [name] has a normal mood and affect. Her behavior is normal. Judgment and thought content normal.  Nursing note and vitals reviewed.    Adult ECG Report Not checked.  Other studies Reviewed: Additional studies/ records that were reviewed today include:  Recent Labs:   Lab Results  Component Value Date   CREATININE 0.70 10/30/2017   BUN 12 10/30/2017   NA 140 10/30/2017   K 4.1 10/30/2017   CL 103 10/30/2017   CO2 20 10/30/2017   Lab Results  Component Value Date   CHOL 168 10/30/2017   HDL 60 10/30/2017   LDLCALC 88 10/30/2017   TRIG 98 10/30/2017   CHOLHDL 2.8 10/30/2017   Lab Results  Component Value Date   WBC 7.3 10/30/2017   HGB 10.1 (L) 10/30/2017   HCT 33.0 (L) 10/30/2017   MCV 77 (L) 10/30/2017   PLT 329 10/30/2017    ASSESSMENT / PLAN: Problem List Items Addressed This Visit    Chest pain at  rest - Primary    [name] has chest pain that is somewhat atypical in nature.  Most of the features are atypical with exception of the fact that there is some exertional exacerbation. I struggle to figure out the best option for evaluating her, but with the knee for future operations, I think we need to definitively evaluate this symptom in order to dispel any rumors.  [name] is not physically capable of doing a treadmill stress test, and I would be leery of doing a [employer] for fear of GI uptake from her different surgeries throwing off our findings and leading to false positive results. Perhaps the best way to evaluate both anatomic and possibly physiologic evidence of coronary disease would be coronary calcium score with coronary CT angiogram plus/minus PCI.      Relevant Orders   CT CORONARY MORPH W/CTA COR W/SCORE W/CA W/CM &/OR WO/CM   CT CORONARY FRACTIONAL FLOW RESERVE DATA PREP   CT CORONARY FRACTIONAL FLOW RESERVE FLUID ANALYSIS   Precordial chest pain    Typical and  atypical features.  (See discussion elsewhere).   Desire for definitive evaluation for coronary disease.  Plan: Coronary CT angiogram with possible CT FFR.      Relevant Orders   CT CORONARY MORPH W/CTA COR W/SCORE W/CA W/CM &/OR WO/CM   CT CORONARY FRACTIONAL FLOW RESERVE DATA PREP   CT CORONARY FRACTIONAL FLOW RESERVE FLUID ANALYSIS   Basic metabolic panel   Rapid or irregular heartbeat    Intermittent episodes of rapid heartbeats.  [name] really does not want to wear a monitor.  We discussed using a smart phone application along with her smart watch to monitor for arrhythmias.   " Kardia" By [employer]  [employer] FROM THE  [employer]OOGLE[employer]  APP PLAY STORE.        Relevant Orders   CT CORONARY MORPH W/CTA COR W/SCORE W/CA W/CM &/OR WO/CM   CT CORONARY FRACTIONAL FLOW RESERVE DATA PREP   CT CORONARY FRACTIONAL FLOW RESERVE FLUID ANALYSIS   Basic metabolic panel   Right shoulder pain    Pain and mild swelling of the  right shoulder.  [name] has a PICC line in place. Plan: Right upper extremity venous Doppler to exclude thrombus.      Relevant Orders   VAS [place] UPPER EXTREMITY VENOUS DUPLEX    Other Visit Diagnoses    Pre-op testing       Relevant Orders   Basic metabolic panel      Current medicines are reviewed at l ength with the patient today. (+/- concerns) none  The following changes have been made:None  Patient Instructions  MEDICATION  INSTRUCTIONS   ONLY TAKE METOPROLOL TARTRATE 50 MG ONE HOUR PRIOR TO CORONARY CTA  NO OTHER CHANGES.   RECOMMENDATIONS  DR Ellyn Hack RECOMMENDS THAT YOU  PURCHASES  " Kardia" By [employer]. FROM THE  GOOGLE[employer]  APP PLAY STORE.   THE APP IS FREE , BUT THE  EQUIPMENT HAS A COST. IT IS A WAY FOR YOU TO OBTAIN A RECORDING OF YOUR HEART RATE . IT WILL BE A SHORT RHYTHM  STRIP THAT YOU CAN SHOW TO MEDICAL STAFF.   SCHEDULE TEST AT [place] Your physician has requested that you have a  RIGHT upper extremity venous duplex. This test is an ultrasound of the veins in the  arms. It looks at venous blood flow that carries blood from the heart to the arm.  Allow thirty minutes for an Upper Venous exam. There are no restrictions or special instructions.   Your physician recommends that you schedule a follow-up appointment in 2 months with dr harding.  Studies Ordered:   Orders Placed This Encounter  Procedures  . CT CORONARY MORPH W/CTA COR W/SCORE W/CA W/CM &/OR WO/CM  . CT CORONARY FRACTIONAL FLOW RESERVE DATA PREP  . CT CORONARY FRACTIONAL FLOW RESERVE FLUID ANALYSIS  . Basic metabolic panel      Glenetta Hew, M.D., M.S. Interventional Cardiologist   Pager # [phone] Phone # [phone] [address] [place], [place] [address]   Thank you for choosing [facility]!!

## 2017-11-04 NOTE — Patient Instructions (Signed)
MEDICATION  INSTRUCTIONS   ONLY TAKE METOPROLOL TARTRATE 50 MG ONE HOUR PRIOR TO CORONARY CTA  NO OTHER CHANGES.   RECOMMENDATIONS  DR Ellyn Hack RECOMMENDS THAT YOU  PURCHASES  " Kardia" By [employer]. FROM THE  [employer]  APP PLAY STORE.   THE APP IS FREE , BUT THE  EQUIPMENT HAS A COST. IT IS A WAY FOR YOU TO OBTAIN A RECORDING OF YOUR HEART RATE . IT WILL BE A SHORT RHYTHM  STRIP THAT YOU CAN SHOW TO MEDICAL STAFF.   SCHEDULE TEST AT [place] Your physician has requested that you have a  RIGHT upper extremity venous duplex. This test is an ultrasound of the veins in the  arms. It looks at venous blood flow that carries blood from the heart to the arm.  Allow thirty minutes for an Upper Venous exam. There are no restrictions or special instructions.   Your physician recommends that you schedule a follow-up appointment in 2 months with dr harding.      INSTRUCTIONS FOR  CORONARY CTA    Please arrive at the [facility] main entrance of [facility] at (30-45 minutes prior to test start time)  [facility] [place], [place] [address] [phone]  Proceed to the [facility] Radiology Department (First Floor).  Please follow these instructions carefully (unless otherwise directed):  PLEASE HAVE LABS - BMP  AT LEAST ONE WEEK PRIOR TO TEST  On the Night Before the Test: . Drink plenty of water. . Do not consume any caffeinated/decaffeinated beverages or chocolate 12 hours prior to your test. . Do not take any antihistamines 12 hours prior to your test.    On the Day of the Test: . Drink plenty of water. Do not drink any water within one hour of the test. . Do not eat any food 4 hours prior to the test. . You may take your regular medications prior to the test. .  Take 50 mg of lopressor (metoprolol) one hour before the test.   After the Test: . Drink plenty of water. . After receiving IV contrast, you may  experience a mild flushed feeling. This is normal. . On occasion, you may experience a mild rash up to 24 hours after the test. This is not dangerous. If this occurs, you can take Benadryl 25 mg and increase your fluid intake. . If you experience trouble breathing, this can be serious. If it is severe call 911 IMMEDIATELY. If it is mild, please call our office.

## 2017-11-06 ENCOUNTER — Encounter: Payer: Self-pay | Admitting: Cardiology

## 2017-11-06 NOTE — Assessment & Plan Note (Signed)
Typical and atypical features.  (See discussion elsewhere).   Desire for definitive evaluation for coronary disease.  Plan: Coronary CT angiogram with possible CT FFR.

## 2017-11-06 NOTE — Assessment & Plan Note (Signed)
Intermittent episodes of rapid heartbeats.  She really does not want to wear a monitor.  We discussed using a smart phone application along with her smart watch to monitor for arrhythmias.   " Kardia" By [employer]  [employer] FROM THE  [employer]  APP PLAY STORE.

## 2017-11-06 NOTE — Assessment & Plan Note (Signed)
Pain and mild swelling of the right shoulder.  She has a PICC line in place. Plan: Right upper extremity venous Doppler to exclude thrombus.

## 2017-11-06 NOTE — Assessment & Plan Note (Signed)
She has chest pain that is somewhat atypical in nature.  Most of the features are atypical with exception of the fact that there is some exertional exacerbation. I struggle to figure out the best option for evaluating her, but with the knee for future operations, I think we need to definitively evaluate this symptom in order to dispel any rumors.  She is not physically capable of doing a treadmill stress test, and I would be leery of doing a [employer] for fear of GI uptake from her different surgeries throwing off our findings and leading to false positive results. Perhaps the best way to evaluate both anatomic and possibly physiologic evidence of coronary disease would be coronary calcium score with coronary CT angiogram plus/minus PCI.

## 2017-11-09 ENCOUNTER — Encounter: Payer: Self-pay | Admitting: Skilled Nursing Facility1

## 2017-11-09 ENCOUNTER — Encounter: Payer: 59 | Attending: Family Medicine | Admitting: Skilled Nursing Facility1

## 2017-11-09 ENCOUNTER — Ambulatory Visit: Payer: 59

## 2017-11-09 DIAGNOSIS — E669 Obesity, unspecified: Secondary | ICD-10-CM | POA: Diagnosis present

## 2017-11-09 DIAGNOSIS — Z6830 Body mass index (BMI) 30.0-30.9, adult: Secondary | ICD-10-CM | POA: Diagnosis not present

## 2017-11-09 DIAGNOSIS — Z713 Dietary counseling and surveillance: Secondary | ICD-10-CM | POA: Insufficient documentation

## 2017-11-09 DIAGNOSIS — E46 Unspecified protein-calorie malnutrition: Secondary | ICD-10-CM

## 2017-11-09 DIAGNOSIS — Z9884 Bariatric surgery status: Secondary | ICD-10-CM | POA: Diagnosis not present

## 2017-11-09 NOTE — Progress Notes (Signed)
  Post Op appt: Pt states she Does not like to leave the house due to self image/depression. 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 Currently tube feed placed but attempting oral nutrition using the tube for increased water consumption.  Pt states her Stitches are hurting and pulling. Taking 50000 for vitamin d. Pt states she had 4 times throwing up after smelling food; can't stand the smell of cooking food. Pt states she is  trying to get up every hour and move getting a sip of protein shake when she does that. Pt states she is not longer going to the rehydration clinic and her PICC line will be removed February 28th. Pt states she does not have any enteral formula due to physician orders of waiting until she needs it. Pt states she cannot eat yogurt and hates kefir.  Recall: 1 premier shake in a day Water flushes: 3-4 a day 30-40 cc at a time 6 ounces chicken broth: 2 ounces at a time   Nutritional Diagnosis:  Inadequate fluid intake As related to little to no po intake and continuous emesis.  As evidenced by PICC line placement.    02/17/2017 03/08/2017 06/02/2017 11/09/2017   BMI (kg/m^2) 28.1 27.4 27.3 26.1   Fat Mass (lbs) 71.8 64 62.6 64.4   Fat Free Mass (lbs) 105.2 111 106.8 102   Total Body Water (lbs) 75 79 76 72.4   Handout Given: -All liquid options   Goals: -Try unflavored protein powder in the water flush for your tube -Aim for 40 ounces of fluid every day -Aim to finish your protein shake in 30 minutes -Aim to drink 2 ounces at a time of protein shake  -Aim for 60 grams of protein  -Try your liquid multivitamin in your tube -Only have liquid: no solids, purees, eggs, etc.

## 2017-11-09 NOTE — Patient Instructions (Addendum)
-  Try unflavored protein powder in the water flush for your tube  -Aim for 40 ounces of fluid every day  -Aim to finish your protein shake in 30 minutes  -Aim to drink 2 ounces at a time of protein shake   -Aim for 60 grams of protein   -Try your liquid multivitamin in your tube

## 2017-11-10 ENCOUNTER — Ambulatory Visit (HOSPITAL_COMMUNITY)
Admission: RE | Admit: 2017-11-10 | Discharge: 2017-11-10 | Disposition: A | Discharge status: Home / Self Care | Payer: 59 | Source: Ambulatory Visit | Attending: General Surgery | Admitting: General Surgery

## 2017-11-10 DIAGNOSIS — Z452 Encounter for adjustment and management of vascular access device: Secondary | ICD-10-CM | POA: Diagnosis not present

## 2017-11-10 MED ORDER — HEPARIN SOD (PORK) LOCK FLUSH 100 UNIT/ML IV SOLN
250.0000 [IU] | INTRAVENOUS | Status: AC | PRN
  Administered 2017-11-10: 250 [IU]
  Filled 2017-11-10: qty 5

## 2017-11-10 MED ORDER — SODIUM CHLORIDE 0.9% FLUSH
10.0000 mL | INTRAVENOUS | Status: AC | PRN
  Administered 2017-11-10: 10 mL

## 2017-11-10 NOTE — Progress Notes (Signed)
Ms. Masterson came into the [facility] for her weekly central line flush and dressing change. Patient tolerated well.  Discharge instructions given, patient verbalized understanding. Patient alert, oriented, verbal and ambulatory at time of discharge.

## 2017-11-11 ENCOUNTER — Ambulatory Visit (HOSPITAL_COMMUNITY)
Admission: RE | Admit: 2017-11-11 | Discharge: 2017-11-11 | Disposition: A | Discharge status: Home / Self Care | Payer: 59 | Source: Ambulatory Visit | Attending: Cardiology | Admitting: Cardiology

## 2017-11-11 DIAGNOSIS — M25511 Pain in right shoulder: Secondary | ICD-10-CM | POA: Diagnosis not present

## 2017-11-16 NOTE — Telephone Encounter (Signed)
Copied from Center. Topic: General - Other >> Nov 16, 2017  8:00 AM Synthia Innocent wrote: Reason for TKW:[id number] would like to know if Dr Tamala Julian completed her biometric screening, was given to MD at ofc visit 10/30/17. Patient would like a copy of it. Please advise

## 2017-11-17 ENCOUNTER — Encounter: Payer: Self-pay | Admitting: Skilled Nursing Facility1

## 2017-11-17 ENCOUNTER — Encounter: Payer: 59 | Admitting: Skilled Nursing Facility1

## 2017-11-17 ENCOUNTER — Ambulatory Visit (HOSPITAL_COMMUNITY)
Admission: RE | Admit: 2017-11-17 | Discharge: 2017-11-17 | Disposition: A | Discharge status: Home / Self Care | Payer: 59 | Source: Ambulatory Visit | Attending: General Surgery | Admitting: General Surgery

## 2017-11-17 DIAGNOSIS — Z452 Encounter for adjustment and management of vascular access device: Secondary | ICD-10-CM | POA: Diagnosis not present

## 2017-11-17 DIAGNOSIS — Z713 Dietary counseling and surveillance: Secondary | ICD-10-CM | POA: Diagnosis not present

## 2017-11-17 DIAGNOSIS — E46 Unspecified protein-calorie malnutrition: Secondary | ICD-10-CM

## 2017-11-17 MED ORDER — SODIUM CHLORIDE 0.9% FLUSH
10.0000 mL | INTRAVENOUS | Status: AC | PRN
  Administered 2017-11-17: 10 mL

## 2017-11-17 MED ORDER — HEPARIN SOD (PORK) LOCK FLUSH 100 UNIT/ML IV SOLN
250.0000 [IU] | INTRAVENOUS | Status: AC | PRN
  Administered 2017-11-17: 250 [IU]

## 2017-11-17 NOTE — Discharge Instructions (Signed)
PICC dressing changed and PICC flushed.

## 2017-11-17 NOTE — Telephone Encounter (Signed)
Called pt and informed her that form has been faxed and a copy is ready for pickup at [address]. Pt verbalized understanding.

## 2017-11-17 NOTE — Progress Notes (Signed)
  Post Op appt: Pt states she Does not like to leave the house due to self image/depression. 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 Currently tube feed placed but attempting oral nutrition using the tube for increased water consumption.  Pt states her Stitches are hurting and pulling. Taking 50000 for vitamin d. Pt states she had 4 times throwing up after smelling food; can't stand the smell of cooking food. Pt states she is  trying to get up every hour and move getting a sip of protein shake when she does that. Pt states she is not longer going to the rehydration clinic and her PICC line will be removed February 28th. Pt states she does not have any enteral formula due to physician orders of waiting until she needs it. Pt states she cannot eat yogurt and hates kefir.  Pt arrives with no changes in pain management of her back of stiches.  Pt states she has been having back pain which is stopping her from sleeping. Pt states she has had nausea from the pain. Pt states she can only have a small amount of protein shake because it is too disgusting. Pt states she has thrown up 2 times this week not knowing why. Pt states she thinks of Chunky milk keeping her from drinking milk and adding to thinking the protein shake is disgusting. Pt is not entertaining other protein options to do in the morning and will stick with her nasty tasting protein shake in the morning.   Recall: 60 cc = 2oz Po: 1 premier shake in a day Water flushes: 3-4 a day 60-90cc + protein powder cc at a time and flushes some water after to clear it  Po: 6 ounces chicken broth: 2 ounces at a time  30 grams of protein from shake throughout the day and 21 grams of protein in the flushes (7-10 grams at a time)   Nutritional Diagnosis:  Inadequate fluid intake As related to little to no po intake and continuous emesis.  As evidenced by PICC line  placement.    02/17/2017 03/08/2017 06/02/2017 11/09/2017   BMI (kg/m^2) 28.1 27.4 27.3 26.1   Fat Mass (lbs) 71.8 64 62.6 64.4   Fat Free Mass (lbs) 105.2 111 106.8 102   Total Body Water (lbs) 75 79 76 72.4   Handout Given: -All liquid options   Goals: -Try unflavored protein powder in the water flush for your tube -Aim for 40 ounces of fluid every day -Aim to finish your protein shake in 30 minutes -Aim to drink 2 ounces at a time of protein shake  -Aim for 60 grams of protein  -Try your liquid multivitamin in your tube -Try a sugar free popcicle if goes well clear protein in with it and refreeze or try Making sugar free f;avorings with added protein and freeze   -Try to have a Sip of water with lemon or lime before or after the protein shake throughout the day

## 2017-11-17 NOTE — Progress Notes (Signed)
[facility] NOTE   Provider: Dr. Excell Seltzer   Procedure: PICC line dressing change and flush   Note: PICC line dressing changed and PICC line flushed. Patient tolerated procedure well. Discharge instructions given to patient. Patient alert, oriented and ambulatory at discharge.

## 2017-11-17 NOTE — Telephone Encounter (Signed)
Spoke to [place] and she talked to Dr Tamala Julian about the form, the doctor will completed the form. [place] stated she will fax form and call patient to pick it up at [facility] [address] today.

## 2017-11-19 ENCOUNTER — Emergency Department (HOSPITAL_COMMUNITY): Payer: 59

## 2017-11-19 ENCOUNTER — Emergency Department (HOSPITAL_COMMUNITY)
Admission: EM | Admit: 2017-11-19 | Discharge: 2017-11-19 | Disposition: A | Discharge status: Home / Self Care | Payer: 59 | Attending: Emergency Medicine | Admitting: Emergency Medicine

## 2017-11-19 ENCOUNTER — Encounter (HOSPITAL_COMMUNITY): Payer: Self-pay | Admitting: Emergency Medicine

## 2017-11-19 ENCOUNTER — Other Ambulatory Visit: Payer: Self-pay

## 2017-11-19 DIAGNOSIS — R197 Diarrhea, unspecified: Secondary | ICD-10-CM | POA: Diagnosis not present

## 2017-11-19 DIAGNOSIS — R6889 Other general symptoms and signs: Secondary | ICD-10-CM

## 2017-11-19 DIAGNOSIS — Z931 Gastrostomy status: Secondary | ICD-10-CM | POA: Insufficient documentation

## 2017-11-19 DIAGNOSIS — Z9884 Bariatric surgery status: Secondary | ICD-10-CM | POA: Insufficient documentation

## 2017-11-19 DIAGNOSIS — R109 Unspecified abdominal pain: Secondary | ICD-10-CM | POA: Diagnosis not present

## 2017-11-19 DIAGNOSIS — R1110 Vomiting, unspecified: Secondary | ICD-10-CM | POA: Diagnosis not present

## 2017-11-19 DIAGNOSIS — Z4682 Encounter for fitting and adjustment of non-vascular catheter: Secondary | ICD-10-CM | POA: Diagnosis not present

## 2017-11-19 DIAGNOSIS — Z79899 Other long term (current) drug therapy: Secondary | ICD-10-CM | POA: Diagnosis not present

## 2017-11-19 DIAGNOSIS — K5900 Constipation, unspecified: Secondary | ICD-10-CM | POA: Diagnosis not present

## 2017-11-19 DIAGNOSIS — R001 Bradycardia, unspecified: Secondary | ICD-10-CM | POA: Diagnosis not present

## 2017-11-19 DIAGNOSIS — R112 Nausea with vomiting, unspecified: Secondary | ICD-10-CM | POA: Diagnosis not present

## 2017-11-19 DIAGNOSIS — Z87891 Personal history of nicotine dependence: Secondary | ICD-10-CM | POA: Diagnosis not present

## 2017-11-19 DIAGNOSIS — K9423 Gastrostomy malfunction: Secondary | ICD-10-CM | POA: Diagnosis not present

## 2017-11-19 LAB — URINALYSIS, ROUTINE W REFLEX MICROSCOPIC
BILIRUBIN URINE: NEGATIVE
Glucose, UA: NEGATIVE mg/dL
Hgb urine dipstick: NEGATIVE
Ketones, ur: 5 mg/dL — AB
Leukocytes, UA: NEGATIVE
NITRITE: NEGATIVE
Protein, ur: NEGATIVE mg/dL
SPECIFIC GRAVITY, URINE: 1.023 (ref 1.005–1.030)
pH: 6 (ref 5.0–8.0)

## 2017-11-19 LAB — COMPREHENSIVE METABOLIC PANEL
ALK PHOS: 59 U/L (ref 38–126)
ALT: 13 U/L — ABNORMAL LOW (ref 14–54)
ANION GAP: 11 (ref 5–15)
AST: 19 U/L (ref 15–41)
Albumin: 3.7 g/dL (ref 3.5–5.0)
BUN: 9 mg/dL (ref 6–20)
CALCIUM: 8.9 mg/dL (ref 8.9–10.3)
CO2: 22 mmol/L (ref 22–32)
Chloride: 105 mmol/L (ref 101–111)
Creatinine, Ser: 0.62 mg/dL (ref 0.44–1.00)
Glucose, Bld: 76 mg/dL (ref 65–99)
Potassium: 4 mmol/L (ref 3.5–5.1)
SODIUM: 138 mmol/L (ref 135–145)
TOTAL PROTEIN: 7 g/dL (ref 6.5–8.1)
Total Bilirubin: 0.3 mg/dL (ref 0.3–1.2)

## 2017-11-19 LAB — CBC
HCT: 30.1 % — ABNORMAL LOW (ref 36.0–46.0)
HEMOGLOBIN: 9.1 g/dL — AB (ref 12.0–15.0)
MCH: 23.6 pg — AB (ref 26.0–34.0)
MCHC: 30.2 g/dL (ref 30.0–36.0)
MCV: 78 fL (ref 78.0–100.0)
PLATELETS: 343 10*3/uL (ref 150–400)
RBC: 3.86 MIL/uL — AB (ref 3.87–5.11)
RDW: 19.4 % — ABNORMAL HIGH (ref 11.5–15.5)
WBC: 8.2 10*3/uL (ref 4.0–10.5)

## 2017-11-19 LAB — I-STAT BETA HCG BLOOD, ED (MC, WL, AP ONLY): I-stat hCG, quantitative: <5 m[IU]/mL (ref <5)

## 2017-11-19 LAB — LIPASE, BLOOD: Lipase: 22 U/L (ref 11–51)

## 2017-11-19 MED ORDER — SODIUM CHLORIDE 0.9 % IJ SOLN
INTRAMUSCULAR | Status: AC
  Administered 2017-11-19: 50 mL via GASTROSTOMY
  Filled 2017-11-19: qty 50

## 2017-11-19 MED ORDER — ONDANSETRON HCL 4 MG/2ML IJ SOLN
4.0000 mg | Freq: Once | INTRAMUSCULAR | Status: AC
  Administered 2017-11-19: 4 mg via INTRAVENOUS
  Filled 2017-11-19: qty 2

## 2017-11-19 MED ORDER — ONDANSETRON 8 MG PO TBDP: 4.0000 mg | ORAL_TABLET | Freq: Three times a day (TID) | ORAL | 0 refills | Status: DC | PRN

## 2017-11-19 MED ORDER — FENTANYL CITRATE (PF) 100 MCG/2ML IJ SOLN
50.0000 ug | Freq: Once | INTRAMUSCULAR | Status: AC
  Administered 2017-11-19: 50 ug via INTRAVENOUS
  Filled 2017-11-19: qty 2

## 2017-11-19 MED ORDER — HEPARIN SOD (PORK) LOCK FLUSH 100 UNIT/ML IV SOLN
500.0000 [IU] | Freq: Once | INTRAVENOUS | Status: AC
Start: 2017-11-19 — End: 2017-11-19
  Administered 2017-11-19: 500 [IU]
  Filled 2017-11-19: qty 5

## 2017-11-19 MED ORDER — IOPAMIDOL (ISOVUE-300) INJECTION 61%
INTRAVENOUS | Status: AC
  Administered 2017-11-19: 50 mL via GASTROSTOMY
  Filled 2017-11-19: qty 100

## 2017-11-19 MED ORDER — IOPAMIDOL (ISOVUE-300) INJECTION 61%
INTRAVENOUS | Status: AC
  Administered 2017-11-19: 100 mL
  Filled 2017-11-19: qty 100

## 2017-11-19 MED ORDER — SODIUM CHLORIDE 0.9 % IV BOLUS (SEPSIS)
1000.0000 mL | Freq: Once | INTRAVENOUS | Status: AC
  Administered 2017-11-19: 1000 mL via INTRAVENOUS

## 2017-11-19 NOTE — ED Provider Notes (Signed)
[place] DEPT Provider Note   CSN: [id number] Arrival date & time: 11/19/17  0810     History   Chief Complaint Chief Complaint  Patient presents with  . Feeding Tube Problem  . Emesis    HPI [name] is a 42 y.o. female who presents today for evaluation of multiple complaints.  She reports that she has not had a bowel movement in 5-6 days and was feeling very constipated.  She reports compliance with her laxatives and fiber supplements.  She reports that last night she was having abdominal and back pain that she attributed to the constipation, and manually disimpacted herself.  She reports that that provided moderate relief of her pain.  She reports that she had been nauseous and vomiting multiple times, reports that she drinks protein shakes.  She has a G tube in place since her revision of her bypass on 10/25/17 by Dr. Excell Seltzer.  She reports that last night she was watching TV and felt and heard a pop in her abdomen.  She reports that it sounded like it was around her tube.  She reports that her son, who was watching TV, also heard the pop.    She reports that earlier this morning she was feeling dizzy and stumbled.  She did not have a full LOC, rather stumbled immediately after getting up.     HPI  Past Medical History:  Diagnosis Date  . Anal fissure - posterior 10/16/2014    OCC  ISSUES  . Anxiety    doesn't take anything  . Asthma    has Albuterol inhaler as needed  . Boil    on pubic area started septra 03-01-17 draining blood and pus  . Bronchitis   . Chronic headache disorder 07/22/2016  . Clostridium difficile infection 04/20/2012  . Colitis   . Dehydration   . Depression   . Family history of adverse reaction to anesthesia    pt mom gets sick  . GERD (gastroesophageal reflux disease)    takes Pantoprazole daily  . Headache   . Hiatal hernia    neuropathy - mild in arms and legs  . History of blood transfusion    last  transfusion was 04/04/2016=Benadryl was given d/t itching. States she always itches with transfusion.   Marland Kitchen History of bronchitis    > 2 yrs ago  . History of colon polyps    benign  . History of migraine    last one 05/01/16  . History of stomach ulcers   . History of urinary tract infection LAST 2 WEEKS AGO  . IDA (iron deficiency anemia)   . Internal and external bleeding hemorrhoids 06/11/2014  . Joint pain   . Joint swelling   . Left sided chronic colitis - segmental 06/11/2014  . Migraine   . Motion sickness   . Nausea    takes Zofran as needed  . Nausea and vomiting    for 1 year  . Obesity   . Oligouria   . Osteoarthritis    lower back, knees, wrists - no meds  . Pneumonia 1997  . Postoperative nausea and vomiting 01/21/2016   wants scopolamine patch  . SVD (spontaneous vaginal delivery)    x 4  . Tingling    BOTH LOWER EXTREMETIES ALL THE TIME DUE TO RAPID WEIGHT LOSS  . TOBACCO USER 10/02/2009   Qualifier: Diagnosis of  By: Dimas Millin MD, Ellard Artis    . Transfusion history    last  transfusion 6'16   . UC (ulcerative colitis) ([place])    supposed to be taking Lialda and Bentyl but has been off since gastric sleeve  . Ulcerative colitis ([place])   . Vertigo    doesn't take any meds    Patient Active Problem List   Diagnosis Date Noted  . Precordial chest pain 11/04/2017  . Rapid or irregular heartbeat 11/04/2017  . Chest pain at rest 11/04/2017  . Right shoulder pain 11/04/2017  . Marginal ulcer 10/25/2017  . Dehydration 04/02/2017  . Malnutrition, calorie ([place]) 03/27/2017  . Psychophysiological insomnia 03/18/2017  . Nausea and vomiting 12/14/2016  . Paresthesia 07/22/2016  . Chronic headache disorder 07/22/2016  . Memory difficulty 07/22/2016  . Hematochezia 04/04/2016  . 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 Anemia, iron deficiency 10/01/2015  . Skin lesion 05/18/2015  . Anal fissure - posterior 10/16/2014  . Left sided chronic colitis - segmental 06/11/2014    . Internal and external bleeding hemorrhoids 06/11/2014  . Asthma 04/26/2012    Past Surgical History:  Procedure Laterality Date  . ABDOMINAL HYSTERECTOMY     PARTIAL  . COLONOSCOPY  2007   for rectal bleeding; [facility]  . COLONOSCOPY N/A 06/11/2014   Procedure: COLONOSCOPY;  Surgeon: Gatha Mayer, MD;  Location: [facility];  Service: Endoscopy;  Laterality: N/A;  . COLONOSCOPY WITH PROPOFOL N/A 03/02/2017   Procedure: COLONOSCOPY WITH PROPOFOL;  Surgeon: Alphonsa Overall, MD;  Location: [facility];  Service: General;  Laterality: N/A;  . DILATION AND CURETTAGE OF UTERUS    . ESOPHAGOGASTRODUODENOSCOPY (EGD) WITH PROPOFOL N/A 04/06/2016   Procedure: ESOPHAGOGASTRODUODENOSCOPY (EGD) WITH PROPOFOL;  Surgeon: Alphonsa Overall, MD;  Location: [facility];  Service: General;  Laterality: N/A;  . ESOPHAGOGASTRODUODENOSCOPY (EGD) WITH PROPOFOL N/A 03/02/2017   Procedure: ESOPHAGOGASTRODUODENOSCOPY (EGD) WITH PROPOFOL;  Surgeon: Alphonsa Overall, MD;  Location: [facility];  Service: General;  Laterality: N/A;  . ESOPHAGOGASTRODUODENOSCOPY (EGD) WITH PROPOFOL N/A 05/20/2017   Procedure: ESOPHAGOGASTRODUODENOSCOPY (EGD) WITH PROPOFOL ERAS PATHWAY;  Surgeon: Alphonsa Overall, MD;  Location: [facility];  Service: General;  Laterality: N/A;  . ESOPHAGOGASTRODUODENOSCOPY (EGD) WITH PROPOFOL N/A 10/07/2017   Procedure: ESOPHAGOGASTRODUODENOSCOPY (EGD) WITH PROPOFOL;  Surgeon: Alphonsa Overall, MD;  Location: [facility];  Service: General;  Laterality: N/A;  . EXCISION OF SKIN TAG  06/08/2017   Procedure: EXCISION OF VULVAR SKIN TAGS X2;  Surgeon: [name], MD;  Location: [facility];  Service: Gynecology;;  . FOOT SURGERY Bilateral    x 2  . GASTRIC ROUX-EN-Y N/A 12/14/2016   Procedure: LAPAROSCOPIC REVISION SLEEVE GASTRECTOMY TO  ROUX-Y-GASTRIC BY-PASS, UPPER ENDO;  Surgeon: Excell Seltzer, MD;  Location: [facility];  Service: General;  Laterality: N/A;  . GASTROJEJUNOSTOMY N/A 05/11/2016   Procedure:  LAPAROSCOPIC PLACEMENT  OF FEEDING  JEJUNOSTOMY TUBE;  Surgeon: Excell Seltzer, MD;  Location: [facility];  Service: General;  Laterality: N/A;  . HEMORRHOIDECTOMY WITH HEMORRHOID BANDING    . IR FLUORO GUIDE CV LINE RIGHT  04/06/2017  . IR GENERIC HISTORICAL  05/19/2016   IR CM INJ ANY COLONIC TUBE W/FLUORO 05/19/2016 Aletta Edouard, MD [facility]  . IR US GUIDE VASC ACCESS RIGHT  04/06/2017  . j tube removed march 2018    . KNEE ARTHROSCOPY Left 09/06/2014  . LAPAROSCOPIC GASTRIC SLEEVE RESECTION N/A 01/14/2016   Procedure: LAPAROSCOPIC GASTRIC SLEEVE RESECTION;  Surgeon: Excell Seltzer, MD;  Location: [facility];  Service: General;  Laterality: N/A;  . LAPAROSCOPIC REVISION OF GASTROJEJUNOSTOMY N/A 10/25/2017   Procedure:  LAPAROSCOPIC REVISION OF GASTROJEJUNOSTOMY AND PARTIAL GASTRECTOMY, WITH PLACEMENT OF FEEDING GASTROSTOMY TUBE;  Surgeon: Excell Seltzer, MD;  Location: [facility];  Service: General;  Laterality: N/A;  . LAPAROSCOPIC TUBAL LIGATION  10/16/2011   Procedure: LAPAROSCOPIC TUBAL LIGATION;  Surgeon: Alwyn Pea, MD;  Location: [facility];  Service: Gynecology;  Laterality: Bilateral;  . NOVASURE ABLATION  09/28/2010   mild persistent vaginal bleeding  . right knee arthroscopy     05/07/2016  . TUBAL LIGATION    . VAGINAL HYSTERECTOMY Bilateral 06/08/2017   Procedure: HYSTERECTOMY VAGINAL W/ BILATERAL SALPINGECTOMY;  Surgeon: [name], MD;  Location: [facility];  Service: Gynecology;  Laterality: Bilateral;  . wisdom teeth extracted      OB History    Gravida Para Term Preterm AB Living   4 4 4     3    SAB TAB Ectopic Multiple Live Births                   Home Medications    Prior to Admission medications   Medication Sig Start Date End Date Taking? Authorizing Provider  acetaminophen (TYLENOL) 500 MG tablet Take 1,000 mg by mouth daily as needed (PAIN).   Yes [provider]  albuterol (PROVENTIL HFA;VENTOLIN HFA) 108 (90 Base) MCG/ACT inhaler Inhale 2 puffs  into the lungs every 6 (six) hours as needed for wheezing or shortness of breath. For shortness of breath Patient taking differently: Inhale 2 puffs into the lungs every 6 (six) hours as needed for wheezing or shortness of breath.  09/22/16  Yes Wardell Honour, MD  CALCIUM CITRATE PO Take 10 mLs by mouth daily.   Yes [provider]  cyclobenzaprine (FLEXERIL) 5 MG tablet Take 1 tablet (5 mg total) by mouth 3 (three) times daily as needed for muscle spasms. 10/30/17  Yes Wardell Honour, MD  FLUoxetine (PROZAC) 10 MG tablet Take 1 tablet (10 mg total) by mouth daily. 09/22/17  Yes Wardell Honour, MD  HYDROcodone-acetaminophen (NORCO/VICODIN) 5-325 MG tablet Take 1 tablet by mouth every 6 (six) hours as needed. for pain 11/10/17  Yes [provider]  hydrocortisone (ANUSOL-HC) 25 MG suppository Place 1 suppository (25 mg total) rectally 2 (two) times daily. 06/10/17  Yes [name], MD  linaclotide [facility]) 290 MCG CAPS capsule Take 290 mcg by mouth daily before breakfast.   Yes [provider]  Multiple Vitamins-Minerals (MULTIVITAMIN ADULT) CHEW Chew 1 tablet by mouth 2 (two) times daily.   Yes [provider]  thiamine (VITAMIN B-1) 100 MG tablet Take 100 mg by mouth daily.    Yes [provider]  cyanocobalamin (,VITAMIN B-12,) 1000 MCG/ML injection Inject 1,000 mcg into the muscle every 30 (thirty) days.    [provider]  metoprolol tartrate (LOPRESSOR) 50 MG tablet Take 1 tablet (50 mg total) by mouth once for 1 dose. TAKE ONE HOUR PRIOR TO  SCHEDULE CARDAIC TEST 11/04/17 11/04/17  Leonie Man, MD  ondansetron (ZOFRAN-ODT) 8 MG disintegrating tablet Take 0.5-1 tablets (4-8 mg total) by mouth every 8 (eight) hours as needed for nausea or vomiting. 11/19/17   Lorin Glass, PA-C    Family History Family History  Problem Relation Age of Onset  . Hypertension Mother   . Diabetes Mother   . Sarcoidosis Mother        lungs and skin   . Asthma Mother   . Hypertension Father   . Diabetes Father   . Asthma  Son   . Tics Son   . Asthma Sister   . Cancer Sister        possible pancreatic cancer  . Adrenal disorder Sister        Tumor   . Asthma Brother   . Asthma Daughter        died age 26.5  . Cancer Daughter 4       brain; died age 26.5  . Asthma Son   . Cancer Paternal Aunt        brain, colon, lung and esophagus; unsure of primary     Social History Social History   Tobacco Use  . Smoking status: Former Smoker    Packs/day: 0.50    Years: 20.00    Pack years: 10.00    Types: Cigarettes    Last attempt to quit: 03/17/2014    Years since quitting: 3.6  . Smokeless tobacco: Never Used  Substance Use Topics  . Alcohol use: No    Alcohol/week: 0.0 oz  . Drug use: No     Allergies   Coconut oil; Morphine and related; Oxycodone; Ciprofloxacin; Doxycycline; Adhesive [tape]; and Penicillins   Review of Systems Review of Systems  Constitutional: Negative for chills and fever.  HENT: Negative for congestion.   Respiratory: Negative for chest tightness and shortness of breath.   Gastrointestinal: Positive for abdominal pain, constipation, nausea and vomiting. Negative for anal bleeding and blood in stool.  Genitourinary: Negative for flank pain.  Musculoskeletal: Positive for back pain (Resolved after manually disimpacting her self. ).  Neurological: Positive for dizziness. Negative for syncope, weakness and headaches.  All other systems reviewed and are negative.    Physical Exam Updated Vital Signs BP 113/83   Pulse (!) 54   Temp 98.3 F (36.8 C) (Oral)   Resp 14   LMP 06/02/2017 Comment: hysterectomy 06/08/2017  SpO2 100%   Physical Exam  Constitutional: She is oriented to person, place, and time. She appears well-developed and well-nourished. No distress.  HENT:  Head: Normocephalic and atraumatic.  Mouth/Throat: Oropharynx is clear and moist.  Eyes: Conjunctivae are normal. Right eye  exhibits no discharge. Left eye exhibits no discharge. No scleral icterus.  Neck: Normal range of motion. Neck supple.  Cardiovascular: Normal rate, regular rhythm and intact distal pulses.  2+ DP/PT pulses bilaterally.   Pulmonary/Chest: Effort normal. No stridor. No respiratory distress.  Abdominal: Soft. Bowel sounds are normal. She exhibits no distension and no mass. There is tenderness. There is rebound (RLQ).  G tube present, site around G-tube appears clean, dry and intact.  It has been pulled out approx 1cm past where it normally sits.   Musculoskeletal: She exhibits no edema or deformity.  Neurological: She is alert and oriented to person, place, and time. She exhibits normal muscle tone.  Skin: Skin is warm and dry. She is not diaphoretic.  Psychiatric: She has a normal mood and affect. Her behavior is normal.  Nursing note and vitals reviewed.    ED Treatments / Results  Labs (all labs ordered are listed, but only abnormal results are displayed) Labs Reviewed  COMPREHENSIVE METABOLIC PANEL - Abnormal; Notable for the following components:      Result Value   ALT 13 (*)    All other components within normal limits  CBC - Abnormal; Notable for the following components:   RBC 3.86 (*)    Hemoglobin 9.1 (*)    HCT 30.1 (*)    MCH 23.6 (*)  RDW 19.4 (*)    All other components within normal limits  URINALYSIS, ROUTINE W REFLEX MICROSCOPIC - Abnormal; Notable for the following components:   Ketones, ur 5 (*)    All other components within normal limits  LIPASE, BLOOD  I-STAT BETA HCG BLOOD, ED ([facility], [facility], [facility] ONLY)    EKG  EKG Interpretation  Date/Time:  Friday November 19 2017 11:38:59 EST Ventricular Rate:  52 PR Interval:    QRS Duration: 87 QT Interval:  454 QTC Calculation: 423 R Axis:   28 Text Interpretation:  Sinus bradycardia Low voltage, precordial leads Confirmed by Sherwood Gambler [phone]) on 11/19/2017 3:17:34 PM       Radiology Ct Abdomen Pelvis W  Contrast  Result Date: 11/19/2017 CLINICAL DATA:  History of gastric bypass surgery, complicated by marginal ulcer at the gastrojejunostomy, refractory to medical therapy, status post revision of gastrojejunostomy with partial gastrectomy and percutaneous feeding gastrostomy tube placement on 10/25/2017. Acute generalized abdominal pain and vomiting. Ulcerative colitis. EXAM: CT ABDOMEN AND PELVIS WITH CONTRAST TECHNIQUE: Multidetector CT imaging of the abdomen and pelvis was performed using the standard protocol following bolus administration of intravenous contrast. CONTRAST:  151m ISOVUE-300 IOPAMIDOL (ISOVUE-300) INJECTION 61% COMPARISON:  06/14/2017 CT abdomen/pelvis. FINDINGS: Lower chest: No significant pulmonary nodules or acute consolidative airspace disease. Hepatobiliary: Normal liver size. Stable simple 1.0 cm posterior right liver lobe cyst. No new liver lesions. Normal gallbladder with no radiopaque cholelithiasis. No biliary ductal dilatation. Pancreas: Normal, with no mass or duct dilation. Spleen: Normal size spleen. Small 1.0 cm cystic lesion in the lower spleen (series 2/image 26) is increased from 0.4 cm on 06/14/2017 CT, considered benign. No additional splenic lesions. Adrenals/Urinary Tract: Normal adrenals. Normal kidneys with no hydronephrosis and no renal mass. Normal bladder. Stomach/Bowel: Patient is status post gastric bypass surgery with interval revision of the gastrojejunostomy and partial gastrectomy. The gastrojejunostomy appears intact and without significant wall thickening or associated fluid collections. A percutaneous gastrostomy tube terminates within the remnant distal stomach, noting no evidence of a distended anchoring balloon associated with this gastrostomy tube. Stable enteroenterostomy in the left abdomen. No unexpected small-bowel dilatation or small bowel wall thickening. Normal appendix. Fluid levels in right colon. No large bowel wall thickening or acute  pericolonic fat stranding. Vascular/Lymphatic: Normal caliber abdominal aorta. Patent portal, splenic and renal veins. No pathologically enlarged lymph nodes in the abdomen or pelvis. Reproductive: Status post hysterectomy, with no abnormal findings at the vaginal cuff. Simple 2.2 cm left adnexal cyst, for which no follow-up is recommended ( This recommendation follows ACR consensus guidelines: White Paper of the ACR Incidental Findings Committee II on Adnexal Findings. J Am Coll Radiol [phone]. No right adnexal mass. Other: No pneumoperitoneum, ascites or focal fluid collection. Musculoskeletal: No aggressive appearing focal osseous lesions. Mild thoracolumbar spondylosis. IMPRESSION: 1. No evidence of a complication at the revised gastrojejunostomy. No abscess. No free air. No evidence of bowel obstruction. 2. Percutaneous gastrostomy tube terminates within the remnant distal stomach. No evidence of a distended anchoring balloon associated with this gastrostomy tube. Recommend gastrostomy tube evaluation. 3. Fluid levels in the right colon, compatible with a nonspecific malabsorptive/diarrheal state. No bowel wall thickening. Normal appendix. Electronically Signed   By: JIlona SorrelM.D.   On: 11/19/2017 12:19   Dg Abdomen Peg Tube Location  Result Date: 11/19/2017 CLINICAL DATA:  Peg tube placement EXAM: ABDOMEN - 1 VIEW COMPARISON:  None. FINDINGS: Gastrostomy tube with the tip projecting over the antrum of the stomach. Contrast  injected through the gastrostomy tube opacifies antrum and proximal small bowel. There is no bowel dilatation to suggest obstruction. There is no evidence of pneumoperitoneum, portal venous gas or pneumatosis. There are no pathologic calcifications along the expected course of the ureters. The osseous structures are unremarkable. IMPRESSION: Gastrostomy tube with the tip projecting over the antrum of the stomach. Contrast injected through the gastrostomy tube opacifies  antrum and proximal small bowel. Electronically Signed   By: Kathreen Devoid   On: 11/19/2017 15:48    Procedures Procedures (including critical care time)  Medications Ordered in ED Medications  sodium chloride 0.9 % bolus 1,000 mL (0 mLs Intravenous Stopped 11/19/17 1135)  ondansetron (ZOFRAN) injection 4 mg (4 mg Intravenous Given 11/19/17 1027)  fentaNYL (SUBLIMAZE) injection 50 mcg (50 mcg Intravenous Given 11/19/17 1025)  iopamidol (ISOVUE-300) 61 % injection (100 mLs  Contrast Given 11/19/17 1142)  sodium chloride 0.9 % injection (50 mLs PEG Tube Given 11/19/17 1543)  iopamidol (ISOVUE-300) 61 % injection (50 mLs PEG Tube Contrast Given 11/19/17 1545)  heparin lock flush 100 unit/mL (500 Units Intracatheter Given 11/19/17 1733)     Initial Impression / Assessment and Plan / ED Course  I have reviewed the triage vital signs and the nursing notes.  Pertinent labs & imaging results that were available during my care of the patient were reviewed by me and considered in my medical decision making (see chart for details).  Clinical Course as of Nov 20 1855  Fri Nov 19, 2017  1409 Spoke with materials who does not have any foleys in that size.  Spoke with OR who will tube a 22 f foley with 26m balloon.   [EH]  1557 Patient reevaluated, informed that replacement appears to be in place.  Requested discharge with Zofran.   [EH]    Clinical Course User Index [EH] HLorin Glass PA-C   CLaurita Quintpresents today for evaluation of abdominal pain and feeling/hearing something pop in her stomach.  Initially concern for appendicitis based on RLQ pain with rebound.  Labs were obtained and reviewed.  Anemia consistent with normal baseline, advised to start taking iron supplements and follow up with PCP.  CT abdomen was obtained based on need to evaluate appendix and patient being three weeks post op.  CT showed new significant change of the foley  balloon on her g-tube (that is a foley being  used as a G tube) is deflated, consistent with the pop she described.  No cause for her nausea, vomiting and abdominal pain found.  General surgery was consulted who replaced the tube and according to their note patient is ok for d/c after confirmatory imaging.  Images obtained showing feeding tube is in stomach.  Patient advised to make sure she is staying well hydrated.  She was given strict return precautions and instructed to follow up with her PCP and surgeon.  She stated her understanding.  Will give RX for zofran to help her staying hydrated.   Final Clinical Impressions(s) / ED Diagnoses   Final diagnoses:  Complaint associated with gastric tube ([facility]    ED Discharge Orders        Ordered    ondansetron (ZOFRAN-ODT) 8 MG disintegrating tablet  Every 8 hours PRN     11/19/17 1603       HLorin Glass PVermont02/22/19 1Kai Levins MD 11/20/17 [phone]

## 2017-11-19 NOTE — ED Triage Notes (Addendum)
Pt verbalizes emesis and constipation throughout the night. Pt continues to verbalize "feeding tube balloon popped" this morning at 2 this morning.

## 2017-11-19 NOTE — Discharge Instructions (Signed)
Please follow up with your doctor for your constipation.  Please follow up with your surgeon.

## 2017-11-19 NOTE — Progress Notes (Signed)
[facility]/Trauma Progress Note      Subjective: CC: gastric tube balloon popped Pt is s/p laparoscopic revision of gastrojejunostomy with partial gastrectomy, with placement of feeding gastrostomy tube on 01/28. Pt states last night she heard a loud pop. She suspected that her G tube balloon has burst. She was having intermittent nausea with emesis prior to the pop. She denies pain at the time of the noise. She denies blood in her emesis.   Assessment/Plan gastric tube replacement - tube was replaced and balloon was inflated. Pt tolerated it well.  - ordered a xray to confirm placement, xray pending. If xray confirms proper tube placement the pt can be discharged home with f/u with Dr. Excell Seltzer in one week.   Objective: Vital signs in last 24 hours: Temp:  [98.3 F (36.8 C)] 98.3 F (36.8 C) (02/22 0819) Pulse Rate:  [52-78] 52 (02/22 1330) Resp:  [7-18] 13 (02/22 1330) BP: (110-126)/(71-83) 113/77 (02/22 1330) SpO2:  [98 %-100 %] 99 % (02/22 1330)    Intake/Output from previous day: No intake/output data recorded. Intake/Output this shift: Total I/O In: 1000 [IV Piggyback:1000] Out: -   PE: Gen:  Alert, NAD, pleasant, cooperative Card:  RRR, no M/G/R heard Pulm:  CTA, no W/R/R, effort normal Abd: Soft, ND, +BS, G tube in place and is without surrounding erythema, mild discharge. Tube replaced and balloon inflated. Very mild generalized TTP without guarding.  Skin: no rashes noted, warm and dry   Anti-infectives: Anti-infectives (From admission, onward)   None      Lab Results:  Recent Labs    11/19/17 0927  WBC 8.2  HGB 9.1*  HCT 30.1*  PLT 343   BMET Recent Labs    11/19/17 0927  NA 138  K 4.0  CL 105  CO2 22  GLUCOSE 76  BUN 9  CREATININE 0.62  CALCIUM 8.9   PT/INR No results for input(s): LABPROT, INR in the last 72 hours. CMP     Component Value Date/Time   NA 138 11/19/2017 0927   NA 140 10/30/2017 1541   K 4.0 11/19/2017  0927   CL 105 11/19/2017 0927   CO2 22 11/19/2017 0927   GLUCOSE 76 11/19/2017 0927   BUN 9 11/19/2017 0927   BUN 12 10/30/2017 1541   CREATININE 0.62 11/19/2017 0927   CREATININE 0.88 06/05/2016 1003   CALCIUM 8.9 11/19/2017 0927   PROT 7.0 11/19/2017 0927   PROT 7.7 10/30/2017 1541   ALBUMIN 3.7 11/19/2017 0927   ALBUMIN 4.4 10/30/2017 1541   AST 19 11/19/2017 0927   ALT 13 (L) 11/19/2017 0927   ALKPHOS 59 11/19/2017 0927   BILITOT 0.3 11/19/2017 0927   BILITOT 0.2 10/30/2017 1541   GFRNONAA >60 11/19/2017 0927   GFRNONAA 84 06/05/2016 1003   GFRAA >60 11/19/2017 0927   GFRAA >89 06/05/2016 1003   Lipase     Component Value Date/Time   LIPASE 22 11/19/2017 0927    Studies/Results: Ct Abdomen Pelvis W Contrast  Result Date: 11/19/2017 CLINICAL DATA:  History of gastric bypass surgery, complicated by marginal ulcer at the gastrojejunostomy, refractory to medical therapy, status post revision of gastrojejunostomy with partial gastrectomy and percutaneous feeding gastrostomy tube placement on 10/25/2017. Acute generalized abdominal pain and vomiting. Ulcerative colitis. EXAM: CT ABDOMEN AND PELVIS WITH CONTRAST TECHNIQUE: Multidetector CT imaging of the abdomen and pelvis was performed using the standard protocol following bolus administration of intravenous contrast. CONTRAST:  136m ISOVUE-300 IOPAMIDOL (ISOVUE-300) INJECTION 61% COMPARISON:  06/14/2017 CT abdomen/pelvis. FINDINGS: Lower chest: No significant pulmonary nodules or acute consolidative airspace disease. Hepatobiliary: Normal liver size. Stable simple 1.0 cm posterior right liver lobe cyst. No new liver lesions. Normal gallbladder with no radiopaque cholelithiasis. No biliary ductal dilatation. Pancreas: Normal, with no mass or duct dilation. Spleen: Normal size spleen. Small 1.0 cm cystic lesion in the lower spleen (series 2/image 26) is increased from 0.4 cm on 06/14/2017 CT, considered benign. No additional splenic  lesions. Adrenals/Urinary Tract: Normal adrenals. Normal kidneys with no hydronephrosis and no renal mass. Normal bladder. Stomach/Bowel: Patient is status post gastric bypass surgery with interval revision of the gastrojejunostomy and partial gastrectomy. The gastrojejunostomy appears intact and without significant wall thickening or associated fluid collections. A percutaneous gastrostomy tube terminates within the remnant distal stomach, noting no evidence of a distended anchoring balloon associated with this gastrostomy tube. Stable enteroenterostomy in the left abdomen. No unexpected small-bowel dilatation or small bowel wall thickening. Normal appendix. Fluid levels in right colon. No large bowel wall thickening or acute pericolonic fat stranding. Vascular/Lymphatic: Normal caliber abdominal aorta. Patent portal, splenic and renal veins. No pathologically enlarged lymph nodes in the abdomen or pelvis. Reproductive: Status post hysterectomy, with no abnormal findings at the vaginal cuff. Simple 2.2 cm left adnexal cyst, for which no follow-up is recommended ( This recommendation follows ACR consensus guidelines: White Paper of the ACR Incidental Findings Committee II on Adnexal Findings. J Am Coll Radiol [phone]). No right adnexal mass. Other: No pneumoperitoneum, ascites or focal fluid collection. Musculoskeletal: No aggressive appearing focal osseous lesions. Mild thoracolumbar spondylosis. IMPRESSION: 1. No evidence of a complication at the revised gastrojejunostomy. No abscess. No free air. No evidence of bowel obstruction. 2. Percutaneous gastrostomy tube terminates within the remnant distal stomach. No evidence of a distended anchoring balloon associated with this gastrostomy tube. Recommend gastrostomy tube evaluation. 3. Fluid levels in the right colon, compatible with a nonspecific malabsorptive/diarrheal state. No bowel wall thickening. Normal appendix. Electronically Signed   By: Ilona Sorrel  M.D.   On: 11/19/2017 12:19      Kalman Drape , [facility] 11/19/2017, 2:53 PM Pager: [phone] Consults: [phone] Mon-Fri 7:00 am-4:30 pm Sat-Sun 7:00 am-11:30 am

## 2017-11-24 ENCOUNTER — Ambulatory Visit (HOSPITAL_COMMUNITY)
Admission: RE | Admit: 2017-11-24 | Discharge: 2017-11-24 | Disposition: A | Discharge status: Home / Self Care | Payer: 59 | Source: Ambulatory Visit | Attending: General Surgery | Admitting: General Surgery

## 2017-11-24 DIAGNOSIS — Z452 Encounter for adjustment and management of vascular access device: Secondary | ICD-10-CM | POA: Diagnosis not present

## 2017-11-24 MED ORDER — SODIUM CHLORIDE 0.9% FLUSH: 10.0000 mL | INTRAVENOUS | Status: DC | PRN

## 2017-11-24 MED ORDER — HEPARIN SOD (PORK) LOCK FLUSH 100 UNIT/ML IV SOLN
250.0000 [IU] | INTRAVENOUS | Status: AC | PRN
Start: 2017-11-24 — End: 2017-11-24
  Administered 2017-11-24: 250 [IU]
  Filled 2017-11-24: qty 5

## 2017-11-24 NOTE — Progress Notes (Signed)
[facility] NOTE   Provider: Dr. Excell Seltzer    Procedure: PICC line dressing change and PICC flush   Note: Patient's PICC line dressing was changed and PICC flushed with heparin. PICC insertion site was clean, dry and intact. Patient tolerated procedure well. Next dressing change due 12/01/17. Discharge instructions given to patient. Patient alert, oriented and ambulatory at discharge.

## 2017-11-24 NOTE — Discharge Instructions (Signed)
PICC dressing changed and line flushed.

## 2017-11-25 ENCOUNTER — Ambulatory Visit (HOSPITAL_COMMUNITY): Payer: 59

## 2017-11-25 ENCOUNTER — Ambulatory Visit (HOSPITAL_COMMUNITY)
Admission: RE | Admit: 2017-11-25 | Discharge: 2017-11-25 | Disposition: A | Discharge status: Home / Self Care | Payer: 59 | Source: Ambulatory Visit | Attending: Cardiology | Admitting: Cardiology

## 2017-11-25 DIAGNOSIS — R000 Tachycardia, unspecified: Secondary | ICD-10-CM | POA: Insufficient documentation

## 2017-11-25 DIAGNOSIS — R072 Precordial pain: Secondary | ICD-10-CM | POA: Insufficient documentation

## 2017-11-25 DIAGNOSIS — R079 Chest pain, unspecified: Secondary | ICD-10-CM | POA: Diagnosis not present

## 2017-11-25 MED ORDER — METOPROLOL TARTRATE 5 MG/5ML IV SOLN
5.0000 mg | INTRAVENOUS | Status: DC | PRN
  Filled 2017-11-25: qty 5

## 2017-11-25 MED ORDER — NITROGLYCERIN 0.4 MG SL SUBL
0.8000 mg | SUBLINGUAL_TABLET | SUBLINGUAL | Status: DC | PRN
  Administered 2017-11-25: 0.8 mg via SUBLINGUAL
  Filled 2017-11-25 (×2): qty 25

## 2017-11-25 MED ORDER — METOPROLOL TARTRATE 5 MG/5ML IV SOLN
INTRAVENOUS | Status: AC
  Administered 2017-11-25: 14:00:00
  Filled 2017-11-25: qty 10

## 2017-11-25 MED ORDER — NITROGLYCERIN 0.4 MG SL SUBL
SUBLINGUAL_TABLET | SUBLINGUAL | Status: AC
  Filled 2017-11-25: qty 2

## 2017-11-25 MED ORDER — HEPARIN SOD (PORK) LOCK FLUSH 100 UNIT/ML IV SOLN
250.0000 [IU] | INTRAVENOUS | Status: AC | PRN
  Administered 2017-11-25: 250 [IU]

## 2017-11-25 MED ORDER — NITROGLYCERIN 0.4 MG SL SUBL
0.4000 mg | SUBLINGUAL_TABLET | SUBLINGUAL | Status: DC | PRN
  Filled 2017-11-25: qty 25

## 2017-11-25 MED ORDER — IOPAMIDOL (ISOVUE-370) INJECTION 76%
INTRAVENOUS | Status: AC
  Administered 2017-11-25: 80 mL
  Filled 2017-11-25: qty 100

## 2017-11-29 ENCOUNTER — Other Ambulatory Visit (HOSPITAL_COMMUNITY): Payer: Self-pay | Admitting: General Surgery

## 2017-11-29 ENCOUNTER — Other Ambulatory Visit: Payer: Self-pay | Admitting: Student

## 2017-11-29 DIAGNOSIS — K912 Postsurgical malabsorption, not elsewhere classified: Secondary | ICD-10-CM

## 2017-11-30 ENCOUNTER — Ambulatory Visit (HOSPITAL_COMMUNITY)
Admission: RE | Admit: 2017-11-30 | Discharge: 2017-11-30 | Disposition: A | Discharge status: Home / Self Care | Payer: 59 | Source: Ambulatory Visit | Attending: General Surgery | Admitting: General Surgery

## 2017-11-30 ENCOUNTER — Ambulatory Visit: Payer: 59

## 2017-11-30 ENCOUNTER — Telehealth: Payer: Self-pay | Admitting: Family Medicine

## 2017-11-30 ENCOUNTER — Other Ambulatory Visit (HOSPITAL_COMMUNITY): Payer: Self-pay | Admitting: General Surgery

## 2017-11-30 DIAGNOSIS — Z452 Encounter for adjustment and management of vascular access device: Secondary | ICD-10-CM | POA: Insufficient documentation

## 2017-11-30 DIAGNOSIS — K912 Postsurgical malabsorption, not elsewhere classified: Secondary | ICD-10-CM

## 2017-11-30 DIAGNOSIS — D508 Other iron deficiency anemias: Secondary | ICD-10-CM

## 2017-11-30 HISTORY — PX: IR PATIENT EVAL TECH 0-60 MINS: IMG5564

## 2017-11-30 NOTE — Progress Notes (Signed)
Patient ID: [name], female   DOB: 1976/04/07, 42 y.o.   MRN: [id number] Per order of Dr. Excell Seltzer patient's right upper extremity PICC line was removed in its entirety without immediate complications.  Bandage placed over site.

## 2017-11-30 NOTE — Telephone Encounter (Signed)
A user error has taken place: encounter opened in error, closed for administrative reasons.

## 2017-11-30 NOTE — Telephone Encounter (Signed)
Patient presents in clinic today for labs and second B-12 shot.   No labs present in system. She is requesting a repeat CBC with Diff and CMP to see if further B-12 shots are necessary. Labs were drawn at 11/19/17 hospital encounter. She has an appointment on 12/22/2017 with Dr. Tamala Julian.   Labs pended for provider signature.

## 2017-12-01 ENCOUNTER — Ambulatory Visit (INDEPENDENT_AMBULATORY_CARE_PROVIDER_SITE_OTHER): Payer: 59 | Admitting: Family Medicine

## 2017-12-01 DIAGNOSIS — E46 Unspecified protein-calorie malnutrition: Secondary | ICD-10-CM

## 2017-12-01 DIAGNOSIS — E538 Deficiency of other specified B group vitamins: Secondary | ICD-10-CM | POA: Diagnosis not present

## 2017-12-01 NOTE — Progress Notes (Signed)
B12 injection

## 2017-12-01 NOTE — Telephone Encounter (Signed)
Standing order for B12 injection on MAR.  Labs for CBC and CMET obtained on 10/30/17 visit; last B12 level obtained 09/29/2017.   Fine to proceed with CBC since blood drawn on 11/30/17.  Please call patient to return to office at her convenience for repeat B12 injection.  Do you not see standing orders on MAR?

## 2017-12-03 ENCOUNTER — Encounter (HOSPITAL_COMMUNITY): Payer: Self-pay | Admitting: Radiology

## 2017-12-03 DIAGNOSIS — E538 Deficiency of other specified B group vitamins: Secondary | ICD-10-CM | POA: Diagnosis not present

## 2017-12-03 LAB — CBC WITH DIFFERENTIAL/PLATELET
BASOS ABS: 0.1 10*3/uL (ref 0.0–0.2)
Basos: 1 %
EOS (ABSOLUTE): 0.4 10*3/uL (ref 0.0–0.4)
Eos: 6 %
HEMATOCRIT: 30.8 % — AB (ref 34.0–46.6)
Hemoglobin: 9.3 g/dL — ABNORMAL LOW (ref 11.1–15.9)
Immature Grans (Abs): 0 10*3/uL (ref 0.0–0.1)
Immature Granulocytes: 0 %
LYMPHS ABS: 2.4 10*3/uL (ref 0.7–3.1)
Lymphs: 42 %
MCH: 23.5 pg — AB (ref 26.6–33.0)
MCHC: 30.2 g/dL — AB (ref 31.5–35.7)
MCV: 78 fL — ABNORMAL LOW (ref 79–97)
MONOS ABS: 0.3 10*3/uL (ref 0.1–0.9)
Monocytes: 5 %
Neutrophils Absolute: 2.5 10*3/uL (ref 1.4–7.0)
Neutrophils: 46 %
Platelets: 320 10*3/uL (ref 150–379)
RBC: 3.95 x10E6/uL (ref 3.77–5.28)
RDW: 20.5 % — AB (ref 12.3–15.4)
WBC: 5.6 10*3/uL (ref 3.4–10.8)

## 2017-12-03 NOTE — Addendum Note (Signed)
Addended by: Dierdre Searles on: 12/03/2017 08:25 AM   Modules accepted: Orders

## 2017-12-06 NOTE — Telephone Encounter (Signed)
B12 administed to patient on 12/01/2017, CBC drawn and resulted. Closing encounter.

## 2017-12-08 ENCOUNTER — Encounter (HOSPITAL_COMMUNITY): Payer: 59

## 2017-12-15 ENCOUNTER — Other Ambulatory Visit: Payer: Self-pay

## 2017-12-15 ENCOUNTER — Ambulatory Visit: Payer: Self-pay | Admitting: *Deleted

## 2017-12-15 ENCOUNTER — Encounter: Payer: 59 | Attending: Family Medicine | Admitting: Skilled Nursing Facility1

## 2017-12-15 ENCOUNTER — Encounter: Payer: Self-pay | Admitting: Family Medicine

## 2017-12-15 ENCOUNTER — Ambulatory Visit (INDEPENDENT_AMBULATORY_CARE_PROVIDER_SITE_OTHER): Payer: 59

## 2017-12-15 ENCOUNTER — Ambulatory Visit (INDEPENDENT_AMBULATORY_CARE_PROVIDER_SITE_OTHER): Payer: 59 | Admitting: Family Medicine

## 2017-12-15 VITALS — BP 110/68 | HR 81 | Temp 98.0°F | Resp 16 | Ht 67.13 in | Wt 163.0 lb

## 2017-12-15 DIAGNOSIS — E46 Unspecified protein-calorie malnutrition: Secondary | ICD-10-CM

## 2017-12-15 DIAGNOSIS — F329 Major depressive disorder, single episode, unspecified: Secondary | ICD-10-CM | POA: Diagnosis not present

## 2017-12-15 DIAGNOSIS — D508 Other iron deficiency anemias: Secondary | ICD-10-CM

## 2017-12-15 DIAGNOSIS — G43A1 Cyclical vomiting, intractable: Secondary | ICD-10-CM

## 2017-12-15 DIAGNOSIS — Z9884 Bariatric surgery status: Secondary | ICD-10-CM | POA: Diagnosis not present

## 2017-12-15 DIAGNOSIS — K289 Gastrojejunal ulcer, unspecified as acute or chronic, without hemorrhage or perforation: Secondary | ICD-10-CM

## 2017-12-15 DIAGNOSIS — Z6830 Body mass index (BMI) 30.0-30.9, adult: Secondary | ICD-10-CM | POA: Insufficient documentation

## 2017-12-15 DIAGNOSIS — R112 Nausea with vomiting, unspecified: Secondary | ICD-10-CM | POA: Diagnosis not present

## 2017-12-15 DIAGNOSIS — E669 Obesity, unspecified: Secondary | ICD-10-CM | POA: Insufficient documentation

## 2017-12-15 DIAGNOSIS — R1115 Cyclical vomiting syndrome unrelated to migraine: Secondary | ICD-10-CM

## 2017-12-15 DIAGNOSIS — Z713 Dietary counseling and surveillance: Secondary | ICD-10-CM | POA: Insufficient documentation

## 2017-12-15 MED ORDER — ONDANSETRON 8 MG PO TBDP: 8.0000 mg | ORAL_TABLET | Freq: Three times a day (TID) | ORAL | 5 refills | Status: DC | PRN

## 2017-12-15 MED ORDER — FLUOXETINE HCL 20 MG PO TABS: 20.0000 mg | ORAL_TABLET | Freq: Every day | ORAL | 1 refills | Status: DC

## 2017-12-15 MED ORDER — PANTOPRAZOLE SODIUM 40 MG PO TBEC: 40.0000 mg | DELAYED_RELEASE_TABLET | Freq: Two times a day (BID) | ORAL | 1 refills | Status: DC

## 2017-12-15 NOTE — Progress Notes (Signed)
Subjective:    Patient ID: [name], female    DOB: [birth year], 42 y.o.   MRN: [id number]  12/15/2017  Depression (pt states for the last 2 weeks her dpression is getting worst ) and Nausea (x 2 weeks )    HPI This 41 y.o. female presents for evaluation of depression and nausea.  Onset with depression two weeks ago.  Started vomiting again and got depressed because disappointed that surgery did not work. Feels like disappointing physicians and children and mother.  Dreamed pt died.  Called Dr. Lori/psychology and started counseling last week.  No SI.  Taking Fluoxetine at 5:30am.  Must keep Fluoxetine right beside patient; when moves around, gets nauseated and then gets sick.  Takes medication early so that it is absorbed. Saw nutritionist today who is going to contact Dr. Excell Seltzer; has not contacted Dr. Excell Seltzer yet.    Tube feeding every 2 hours.  Vomiting in office. Water falling out of mouth.   Excessive belching in office; has gas.   When constipated, gas builds up.  Last bowel movement four days ago. Has gas pains under ribs.  BP Readings from Last 3 Encounters:  01/26/18 110/68  01/10/18 111/71  01/01/18 113/80   Wt Readings from Last 3 Encounters:  01/27/18 180 lb 6.4 oz (81.8 kg)  01/26/18 177 lb (80.3 kg)  01/10/18 171 lb 12.8 oz (77.9 kg)   Immunization History  Administered Date(s) Administered  . Influenza,inj,Quad PF,6+ Mos 09/02/2015, 07/21/2016, 05/19/2017  . Influenza-Unspecified 07/30/2014  . Tdap 05/18/2015    Review of Systems  Constitutional: Positive for unexpected weight change. Negative for chills, diaphoresis, fatigue and fever.  Eyes: Negative for visual disturbance.  Respiratory: Negative for cough and shortness of breath.   Cardiovascular: Negative for chest pain, palpitations and leg swelling.  Gastrointestinal: Positive for nausea and vomiting. Negative for abdominal distention, abdominal pain, anal bleeding, blood in stool,  constipation, diarrhea and rectal pain.  Endocrine: Negative for cold intolerance, heat intolerance, polydipsia, polyphagia and polyuria.  Neurological: Negative for dizziness, tremors, seizures, syncope, facial asymmetry, speech difficulty, weakness, light-headedness, numbness and headaches.  Psychiatric/Behavioral: Positive for dysphoric mood. Negative for self-injury, sleep disturbance and suicidal ideas. The patient is nervous/anxious.     Past Medical History:  Diagnosis Date  . Anal fissure - posterior 10/16/2014    OCC  ISSUES  . Anxiety    doesn't take anything  . Asthma    has Albuterol inhaler as needed  . Boil    on pubic area started septra 03-01-17 draining blood and pus  . Bronchitis   . Chronic headache disorder 07/22/2016  . Clostridium difficile infection 04/20/2012  . Colitis   . Dehydration   . Depression   . Family history of adverse reaction to anesthesia    pt mom gets sick  . GERD (gastroesophageal reflux disease)    takes Pantoprazole daily  . Headache   . Hiatal hernia    neuropathy - mild in arms and legs  . History of blood transfusion    last transfusion was 04/04/2016=Benadryl was given d/t itching. States she always itches with transfusion.   Marland Kitchen History of bronchitis    > 2 yrs ago  . History of colon polyps    benign  . History of migraine    last one 05/01/16  . History of stomach ulcers   . History of urinary tract infection LAST 2 WEEKS AGO  . IDA (iron deficiency anemia)   . Internal  and external bleeding hemorrhoids 06/11/2014  . Joint pain   . Joint swelling   . Left sided chronic colitis - segmental 06/11/2014  . Migraine   . Motion sickness   . Nausea    takes Zofran as needed  . Nausea and vomiting    for 1 year  . Obesity   . Oligouria   . Osteoarthritis    lower back, knees, wrists - no meds  . Pneumonia 1997  . Postoperative nausea and vomiting 01/21/2016   wants scopolamine patch  . SVD (spontaneous vaginal delivery)    x 4    . Tingling    BOTH LOWER EXTREMETIES ALL THE TIME DUE TO RAPID WEIGHT LOSS  . TOBACCO USER 10/02/2009   Qualifier: Diagnosis of  By: Dimas Millin MD, Ellard Artis    . Transfusion history    last transfusion 6'16   . UC (ulcerative colitis) ([place])    supposed to be taking Lialda and Bentyl but has been off since gastric sleeve  . Ulcerative colitis ([place])   . Vertigo    doesn't take any meds   Past Surgical History:  Procedure Laterality Date  . ABDOMINAL HYSTERECTOMY     PARTIAL  . COLONOSCOPY  2007   for rectal bleeding; [facility]  . COLONOSCOPY N/A 06/11/2014   Procedure: COLONOSCOPY;  Surgeon: Gatha Mayer, MD;  Location: [facility];  Service: Endoscopy;  Laterality: N/A;  . COLONOSCOPY WITH PROPOFOL N/A 03/02/2017   Procedure: COLONOSCOPY WITH PROPOFOL;  Surgeon: Alphonsa Overall, MD;  Location: [facility];  Service: General;  Laterality: N/A;  . DILATION AND CURETTAGE OF UTERUS    . ESOPHAGOGASTRODUODENOSCOPY (EGD) WITH PROPOFOL N/A 04/06/2016   Procedure: ESOPHAGOGASTRODUODENOSCOPY (EGD) WITH PROPOFOL;  Surgeon: Alphonsa Overall, MD;  Location: [facility];  Service: General;  Laterality: N/A;  . ESOPHAGOGASTRODUODENOSCOPY (EGD) WITH PROPOFOL N/A 03/02/2017   Procedure: ESOPHAGOGASTRODUODENOSCOPY (EGD) WITH PROPOFOL;  Surgeon: Alphonsa Overall, MD;  Location: [facility];  Service: General;  Laterality: N/A;  . ESOPHAGOGASTRODUODENOSCOPY (EGD) WITH PROPOFOL N/A 05/20/2017   Procedure: ESOPHAGOGASTRODUODENOSCOPY (EGD) WITH PROPOFOL ERAS PATHWAY;  Surgeon: Alphonsa Overall, MD;  Location: [facility];  Service: General;  Laterality: N/A;  . ESOPHAGOGASTRODUODENOSCOPY (EGD) WITH PROPOFOL N/A 10/07/2017   Procedure: ESOPHAGOGASTRODUODENOSCOPY (EGD) WITH PROPOFOL;  Surgeon: Alphonsa Overall, MD;  Location: [facility];  Service: General;  Laterality: N/A;  . EXCISION OF SKIN TAG  06/08/2017   Procedure: EXCISION OF VULVAR SKIN TAGS X2;  Surgeon: [name], MD;  Location: [facility];  Service: Gynecology;;  .  FOOT SURGERY Bilateral    x 2  . GASTRIC ROUX-EN-Y N/A 12/14/2016   Procedure: LAPAROSCOPIC REVISION SLEEVE GASTRECTOMY TO  ROUX-Y-GASTRIC BY-PASS, UPPER ENDO;  Surgeon: Excell Seltzer, MD;  Location: [facility];  Service: General;  Laterality: N/A;  . GASTROJEJUNOSTOMY N/A 05/11/2016   Procedure: LAPAROSCOPIC PLACEMENT  OF FEEDING  JEJUNOSTOMY TUBE;  Surgeon: Excell Seltzer, MD;  Location: [facility];  Service: General;  Laterality: N/A;  . HEMORRHOIDECTOMY WITH HEMORRHOID BANDING    . IR FLUORO GUIDE CV LINE RIGHT  04/06/2017  . IR GENERIC HISTORICAL  05/19/2016   IR CM INJ ANY COLONIC TUBE W/FLUORO 05/19/2016 Aletta Edouard, MD [facility]  . IR PATIENT EVAL TECH 0-60 MINS  11/30/2017  . IR US GUIDE VASC ACCESS RIGHT  04/06/2017  . j tube removed march 2018    . KNEE ARTHROSCOPY Left 09/06/2014  . LAPAROSCOPIC GASTRIC SLEEVE RESECTION N/A 01/14/2016   Procedure: LAPAROSCOPIC GASTRIC SLEEVE RESECTION;  Surgeon:  Excell Seltzer, MD;  Location: [facility];  Service: General;  Laterality: N/A;  . LAPAROSCOPIC REVISION OF GASTROJEJUNOSTOMY N/A 10/25/2017   Procedure: LAPAROSCOPIC REVISION OF GASTROJEJUNOSTOMY AND PARTIAL GASTRECTOMY, WITH PLACEMENT OF FEEDING GASTROSTOMY TUBE;  Surgeon: Excell Seltzer, MD;  Location: [facility];  Service: General;  Laterality: N/A;  . LAPAROSCOPIC TUBAL LIGATION  10/16/2011   Procedure: LAPAROSCOPIC TUBAL LIGATION;  Surgeon: Alwyn Pea, MD;  Location: [facility];  Service: Gynecology;  Laterality: Bilateral;  . NOVASURE ABLATION  09/28/2010   mild persistent vaginal bleeding  . right knee arthroscopy     05/07/2016  . TUBAL LIGATION    . VAGINAL HYSTERECTOMY Bilateral 06/08/2017   Procedure: HYSTERECTOMY VAGINAL W/ BILATERAL SALPINGECTOMY;  Surgeon: [name], MD;  Location: [facility];  Service: Gynecology;  Laterality: Bilateral;  . wisdom teeth extracted     Allergies  Allergen Reactions  . Coconut Oil Anaphylaxis and Itching  . Morphine And Related Anaphylaxis and  Hives    Pt states that she has tolerated Norco and Dilaudid  . Oxycodone Anaphylaxis  . Ciprofloxacin Itching and Rash  . Doxycycline Nausea And Vomiting  . Adhesive [Tape] Rash    And paper tape causes a rash if wearing for a prolong period of time  . Penicillins Rash    Has patient had a PCN reaction causing immediate rash, facial/tongue/throat swelling, SOB or lightheadedness with hypotension: Yes Has patient had a PCN reaction causing severe rash involving mucus membranes or skin necrosis: No Has patient had a PCN reaction that required hospitalization No Has patient had a PCN reaction occurring within the last 10 years: No If all of the above answers are "NO", then may proceed with Cephalosporin use.   Current Outpatient Medications on File Prior to Visit  Medication Sig Dispense Refill  . acetaminophen (TYLENOL) 500 MG tablet Take 1,000 mg by mouth daily as needed (PAIN).    Marland Kitchen albuterol (PROVENTIL HFA;VENTOLIN HFA) 108 (90 Base) MCG/ACT inhaler Inhale 2 puffs into the lungs every 6 (six) hours as needed for wheezing or shortness of breath. For shortness of breath (Patient taking differently: Inhale 2 puffs into the lungs every 6 (six) hours as needed for wheezing or shortness of breath. ) 54 g 0  . CALCIUM CITRATE PO Take 10 mLs by mouth daily.    . cyanocobalamin (,VITAMIN B-12,) 1000 MCG/ML injection Inject 1,000 mcg into the muscle every 30 (thirty) days.    Marland Kitchen linaclotide (LINZESS) 290 MCG CAPS capsule Take 290 mcg by mouth daily before breakfast.    . Multiple Vitamins-Minerals (MULTIVITAMIN ADULT) CHEW Chew 1 tablet by mouth 2 (two) times daily.    Marland Kitchen thiamine (VITAMIN B-1) 100 MG tablet Take 100 mg by mouth daily.      Current Facility-Administered Medications on File Prior to Visit  Medication Dose Route Frequency Provider Last Rate Last Dose  . cyanocobalamin ((VITAMIN B-12)) injection 1,000 mcg  1,000 mcg Intramuscular Q30 days Wardell Honour, MD   1,000 mcg at 06/22/17 1537   . cyanocobalamin ((VITAMIN B-12)) injection 1,000 mcg  1,000 mcg Intramuscular Q30 days Wardell Honour, MD   1,000 mcg at 01/26/18 5784   Social History   Socioeconomic History  . Marital status: Single    Spouse name: Not on file  . Number of children: 3  . Years of education: Some college  . Highest education level: Not on file  Occupational History  . Occupation: Production designer, theatre/television/film    Comment: Currently out  on disability d/t surg  Social Needs  . Financial resource strain: Not on file  . Food insecurity:    Worry: Not on file    Inability: Not on file  . Transportation needs:    Medical: Not on file    Non-medical: Not on file  Tobacco Use  . Smoking status: Former Smoker    Packs/day: 0.50    Years: 20.00    Pack years: 10.00    Types: Cigarettes    Last attempt to quit: 03/17/2014    Years since quitting: 3.8  . Smokeless tobacco: Never Used  Substance and Sexual Activity  . Alcohol use: No    Alcohol/week: 0.0 oz  . Drug use: No  . Sexual activity: Not Currently    Birth control/protection: Surgical  Lifestyle  . Physical activity:    Days per week: Not on file    Minutes per session: Not on file  . Stress: Not on file  Relationships  . Social connections:    Talks on phone: Not on file    Gets together: Not on file    Attends religious service: Not on file    Active member of club or organization: Not on file    Attends meetings of clubs or organizations: Not on file    Relationship status: Not on file  . Intimate partner violence:    Fear of current or ex partner: Not on file    Emotionally abused: Not on file    Physically abused: Not on file    Forced sexual activity: Not on file  Other Topics Concern  . Not on file  Social History Narrative   Marital status:  Single; not dating seriously.      Children:  3 sons (59, 53, 62) whose father is deceased, 1 daughter (deceased); no grandchildren      Lives:  With 2 sons      Employment:  Long term  disability in 2018 due to PICC line for iv fluids; Corporate treasurer at [employer]      Tobacco: quit June 2015      Alcohol:  None      Drugs:  None      Exercise:  Leg lifts for knee strengthening; walking   1 caffeine beverage daily      Sexual activity:  Active; total partners = up there.  No STDs in past.        Right-handed   Family History  Problem Relation Age of Onset  . Hypertension Mother   . Diabetes Mother   . Sarcoidosis Mother        lungs and skin  . Asthma Mother   . Hypertension Father   . Diabetes Father   . Asthma Son   . Tics Son   . Asthma Sister   . Cancer Sister        possible pancreatic cancer  . Adrenal disorder Sister        Tumor   . Asthma Brother   . Asthma Daughter        died age 71.5  . Cancer Daughter 4       brain; died age 71.5  . Asthma Son   . Cancer Paternal Aunt        brain, colon, lung and esophagus; unsure of primary        Objective:    BP 110/68   Pulse 81   Temp 98 F (36.7 C) (Oral)   Resp  16   Ht 5' 7.13" (1.705 m)   Wt 163 lb (73.9 kg)   LMP 06/02/2017 Comment: hysterectomy 06/08/2017  SpO2 94%   BMI 25.43 kg/m  Physical Exam  Constitutional: She is oriented to person, place, and time. She appears well-developed and well-nourished. No distress.  HENT:  Head: Normocephalic and atraumatic.  Right Ear: External ear normal.  Left Ear: External ear normal.  Nose: Nose normal.  Mouth/Throat: Oropharynx is clear and moist.  Eyes: Conjunctivae and EOM are normal. Pupils are equal, round, and reactive to light.  Neck: Normal range of motion. Neck supple. Carotid bruit is not present. No thyromegaly present.  Cardiovascular: Normal rate, regular rhythm, normal heart sounds and intact distal pulses. Exam reveals no gallop and no friction rub.  No murmur heard. Pulmonary/Chest: Effort normal and breath sounds normal. She has no wheezes. She has no rales.  Abdominal: Soft. Bowel sounds are normal. She exhibits no  distension and no mass. There is no tenderness. There is no rebound and no guarding.  Lymphadenopathy:    She has no cervical adenopathy.  Neurological: She is alert and oriented to person, place, and time. No cranial nerve deficit.  Skin: Skin is warm and dry. No rash noted. She is not diaphoretic. No erythema. No pallor.  Psychiatric: Her behavior is normal. Judgment and thought content normal. She exhibits a depressed mood.   No results found. Depression screen [facility] 2/9 01/26/2018 12/22/2017 12/15/2017 09/22/2017 07/26/2017  Decreased Interest 2 1 3 1  0  Down, Depressed, Hopeless 2 1 3 1  0  PHQ - 2 Score 4 2 6 2  0  Altered sleeping 1 - 3 1 -  Tired, decreased energy 1 - 3 1 -  Change in appetite 1 - 3 0 -  Feeling bad or failure about yourself  1 - 3 1 -  Trouble concentrating - - 3 0 -  Moving slowly or fidgety/restless - - 3 0 -  Suicidal thoughts - - 0 0 -  PHQ-9 Score 8 - 24 5 -  Difficult doing work/chores - - - - -  Some recent data might be hidden   Fall Risk  01/26/2018 01/01/2018 12/22/2017 12/15/2017 10/30/2017  Falls in the past year? No Yes No No No  Number falls in past yr: - 2 or more - - -  Injury with Fall? - Yes - - -  Comment - - - - -  Follow up - - - - -        Assessment & Plan:   1. Intractable cyclical vomiting with nausea   2. Iron deficiency anemia secondary to inadequate dietary iron intake   3. Marginal ulcer   4. S/P laparoscopic sleeve gastrectomy   5. Malnutrition, calorie ([place])   6. Reactive depression     Persistent nausea despite recent surgical resection of gastric ulcer.  Obtain labs and AAS to evaluate further; recommend contacting general surgeon regarding nausea.  Rx for Protonix and Zofran provided.  S/p sleeve gastrectomy for obesity: obtain labs.  Followed closely by general surgery and nutrition.  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     Orders Placed This Encounter  Procedures  . DG Abd Acute W/Chest    Standing Status:   Future    Number of Occurrences:   1    Standing Expiration Date:   12/15/2018    Order Specific Question:  Reason for exam:    Answer:   GERD, nausea with vomiting    Order Specific Question:   Is the patient pregnant?    Answer:   No    Order Specific Question:   Preferred imaging location?    Answer:   External  . CBC with Differential/Platelet  . Comprehensive metabolic panel  . Amylase  . Lipase  . Urinalysis, dipstick only  . VITAMIN D 25 Hydroxy (Vit-D Deficiency, Fractures)  . Vitamin B12  . Iron  . Urinalysis, dipstick only   Meds ordered this encounter  Medications  . FLUoxetine (PROZAC) 20 MG tablet    Sig: Take 1 tablet (20 mg total) by mouth daily.    Dispense:  90 tablet    Refill:  1  . ondansetron (ZOFRAN-ODT) 8 MG disintegrating tablet    Sig: Take 1 tablet (8 mg total) by mouth every 8 (eight) hours as needed for nausea.    Dispense:  20 tablet    Refill:  5  . pantoprazole (PROTONIX) 40 MG tablet    Sig: Take 1 tablet (40 mg total) by mouth 2 (two) times daily before a meal.    Dispense:  180 tablet    Refill:  1    No follow-ups on file.   Andrian Urbach Elayne Guerin, M.D. Primary Care at [facility] [place] [address] [place], [place]  [phone] phone [phone] fax

## 2017-12-15 NOTE — Patient Instructions (Signed)
     IF you received an x-ray today, you will receive an invoice from [facility]. Please contact [facility] at [phone] with questions or concerns regarding your invoice.   IF you received labwork today, you will receive an invoice from [facility]. Please contact [facility] at [phone] with questions or concerns regarding your invoice.   Our billing staff will not be able to assist you with questions regarding bills from these companies.  You will be contacted with the lab results as soon as they are available. The fastest way to get your results is to activate your My Chart account. Instructions are located on the last page of this paperwork. If you have not heard from us regarding the results in 2 weeks, please contact this office.     

## 2017-12-15 NOTE — Telephone Encounter (Signed)
Reason for Disposition . Symptoms interfere with work or school  Answer Assessment - Initial Assessment Questions TC from an outreach agent with [employer] who had the patient on the line. The reason for the call was Twala stated she was battling depression and at times, thinks  " just not waking up from sleeping would be better than living with the constant nausea and vomiting" as she never gets a break from feeling sick. She stated to me she does not want to die, nor does she want to harm herself but the only time she doesn't experience feeling sick is when she is asleep. Stated she feels like a disappointment to her children. She reported she saw Dr. Arbutus Leas last week and this week. She also saw the nutritionist this week and was recommended to do bolus feedings via tube and not to eat orally. She will begin that today. Reported that she takes her Prozac without fail every morning an hour before she has to get up so that she is able to keep it down. I have scheduled her with PCP this afternoon to discuss her increased depression and nausea/vomiting. She did say the zofran helped when she had it to take.    1. CONCERN: "What happened that made you call today?"     [place], Outreach agent with [facility] phoned in with patient on the line. 2. DEPRESSION SYMPTOM SCREENING: "How are you feeling overall?" (e.g., decreased energy, increased sleeping or difficulty sleeping, difficulty concentrating, feelings of sadness, guilt, hopelessness, or worthlessness)     Wants to sleep as much as possible because its the only time she does not have nausea. She feels she is a disappointment to her children because she feels she's not a good mother due to her continuous nausea and vomiting that is related to her bariactric surgeries and how it has affected her these last two years.  3. RISK OF HARM - SUICIDAL IDEATION:  "Do you ever have thoughts of hurting or killing yourself?"  (e.g., yes, no, no but  preoccupation with thoughts about death)no   - INTENT:  "Do you have thoughts of hurting or killing yourself right NOW?" (e.g., yes, no, N/A)no   - PLAN: "Do you have a specific plan for how you would do this?" (e.g., gun, knife, overdose, no plan, N/A) no 4. RISK OF HARM - HOMICIDAL IDEATION:  "Do you ever have thoughts of hurting or killing someone else?"  (e.g., yes, no, no but preoccupation with thoughts about death) no   - INTENT:  "Do you have thoughts of hurting or killing someone right NOW?" (e.g., yes, no, N/A) no   - PLAN: "Do you have a specific plan for how you would do this?" (e.g., gun, knife, no plan, N/A)    no 5. FUNCTIONAL IMPAIRMENT: "How have things been going for you overall in your life? Have you had any more difficulties than usual doing your normal daily activities?"  (e.g., better, same, worse; self-care, school, work, interactions)    She has been dealing with non-stop nausea and vomiting since her 1st bariatric surgery 2 years ago. She stated she has lost too much weight due to the nausea and vomiting she continues to experience with eating foods, smelling foods, cooking foods.  6. SUPPORT: "Who is with you now?" "Who do you live with?" "Do you have family or friends nearby who you can talk to?"      Kids and family 7. THERAPIST: "Do you have a counselor or  therapist? Name?"  Dr. Arbutus Leas. She started seeing him again last week and again this week. She stated his advice to her today was to talk with PCP regarding her depression. 8. STRESSORS: "Has there been any new stress or recent changes in your life?"     Same stressors that she has had for almost 2 years, the inability to eat without vomiting. 9. DRUG ABUSE/ALCOHOL: "Do you drink alcohol or use any illegal drugs?"     no 10. OTHER: "Do you have any other health or medical symptoms right now?" (e.g., fever)      She stated she had surgery in January as a result of ulcers. She has a jtube intact.  11. PREGNANCY:  "Is there any chance you are pregnant?" "When was your last menstrual period?"    No, hysterectomy.  Protocols used: [place]

## 2017-12-15 NOTE — Progress Notes (Signed)
  Post Op appt: Pt states she Does not like to leave the house due to self image/depression. 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 Currently tube feed placed but attempting oral nutrition using the tube for increased water consumption.  Pt states her front crown came out from all of the emesis. Pt reports vomiting every day every time she eats or just from smelling food. Pt states she does not have any enteral formula due to not having a consultation with home health so she would need to pay for the enteral formula out of pocket instead of insurance paying for it. Pt admits to telling Dr. Excell Seltzer everything was fine with her health even though she is struggling to eat. Pt states she feels extreme depression from disappointing her children and her doctors (stating she does not tell Dr. Excell Seltzer the truth because she does not want to disapoint him). Pt reports Throwing up, constipation, and feeling like her ribs are broken. Pt states she makes a loud gas/gurgling noise every time she vomits. Pt states she woke up thinking she wants to die (dietitian asked if she had any suicidal ideation and pt denied this stating she could not kill herself because that would not be fair to her boys). Pt states she is tired and over being sick. Pt was asked if she felt her distress is in anyway involved with her inability to keep food down and pt denies this.  Foods she has tried without keeping them down: Saltines, protein shakes, noodles, fried egg roll, chicken, jello Dietitian spoke with Dr. Excell Seltzer who agreed to have the pt meet 100% of her needs through her tube feed/have home health visit her for the formula and have her PCP refer her to a psychologist better suited for her needs.  Recall: 60 cc = 2oz Po: 1 premier shake in a day Water flushes: 3-4 a day 60-90cc + protein powder cc at a time and flushes some water after  to clear it  Po: 6 ounces chicken broth: 2 ounces at a time  30 grams of protein from shake throughout the day and 21 grams of protein in the flushes (7-10 grams at a time)   Nutritional Diagnosis:  Inadequate fluid intake As related to little to no po intake and continuous emesis.  As evidenced by PICC line placement.    02/17/2017 03/08/2017 06/02/2017 11/09/2017   BMI (kg/m^2) 28.1 27.4 27.3 26.1   Fat Mass (lbs) 71.8 64 62.6 64.4   Fat Free Mass (lbs) 105.2 111 106.8 102   Total Body Water (lbs) 75 79 76 72.4   Handout Given: -All liquid options   Goals: -Put all fluids and liquid proteins through your tube

## 2017-12-16 LAB — CBC WITH DIFFERENTIAL/PLATELET
BASOS ABS: 0 10*3/uL (ref 0.0–0.2)
Basos: 0 %
EOS (ABSOLUTE): 0.4 10*3/uL (ref 0.0–0.4)
Eos: 5 %
HEMOGLOBIN: 9.8 g/dL — AB (ref 11.1–15.9)
Hematocrit: 32.9 % — ABNORMAL LOW (ref 34.0–46.6)
IMMATURE GRANS (ABS): 0 10*3/uL (ref 0.0–0.1)
Immature Granulocytes: 0 %
LYMPHS: 55 %
Lymphocytes Absolute: 4.9 10*3/uL — ABNORMAL HIGH (ref 0.7–3.1)
MCH: 23.5 pg — AB (ref 26.6–33.0)
MCHC: 29.8 g/dL — ABNORMAL LOW (ref 31.5–35.7)
MCV: 79 fL (ref 79–97)
MONOCYTES: 4 %
Monocytes Absolute: 0.4 10*3/uL (ref 0.1–0.9)
NEUTROS ABS: 3.2 10*3/uL (ref 1.4–7.0)
NEUTROS PCT: 36 %
PLATELETS: 418 10*3/uL — AB (ref 150–379)
RBC: 4.17 x10E6/uL (ref 3.77–5.28)
RDW: 19.5 % — ABNORMAL HIGH (ref 12.3–15.4)
WBC: 8.9 10*3/uL (ref 3.4–10.8)

## 2017-12-16 LAB — URINALYSIS, DIPSTICK ONLY
BILIRUBIN UA: NEGATIVE
GLUCOSE, UA: NEGATIVE
LEUKOCYTES UA: NEGATIVE
Nitrite, UA: NEGATIVE
PH UA: 6 (ref 5.0–7.5)
Protein, UA: NEGATIVE
RBC, UA: NEGATIVE
Urobilinogen, Ur: 1 mg/dL (ref 0.2–1.0)

## 2017-12-16 LAB — VITAMIN B12: VITAMIN B 12: 928 pg/mL (ref 232–1245)

## 2017-12-16 LAB — COMPREHENSIVE METABOLIC PANEL
ALBUMIN: 4 g/dL (ref 3.5–5.5)
ALT: 15 IU/L (ref 0–32)
AST: 24 IU/L (ref 0–40)
Albumin/Globulin Ratio: 1.2 (ref 1.2–2.2)
Alkaline Phosphatase: 66 IU/L (ref 39–117)
BILIRUBIN TOTAL: 0.2 mg/dL (ref 0.0–1.2)
BUN / CREAT RATIO: 16 (ref 9–23)
BUN: 11 mg/dL (ref 6–20)
CHLORIDE: 105 mmol/L (ref 96–106)
CO2: 21 mmol/L (ref 20–29)
CREATININE: 0.67 mg/dL (ref 0.57–1.00)
Calcium: 9.1 mg/dL (ref 8.7–10.2)
GFR calc non Af Amer: 111 mL/min/{1.73_m2} (ref >59)
GFR, EST AFRICAN AMERICAN: 128 mL/min/{1.73_m2} (ref >59)
GLUCOSE: 83 mg/dL (ref 65–99)
Globulin, Total: 3.3 g/dL (ref 1.5–4.5)
Potassium: 4.4 mmol/L (ref 3.5–5.2)
Sodium: 140 mmol/L (ref 134–144)
TOTAL PROTEIN: 7.3 g/dL (ref 6.0–8.5)

## 2017-12-16 LAB — AMYLASE: AMYLASE: 62 U/L (ref 31–124)

## 2017-12-16 LAB — LIPASE: Lipase: 18 U/L (ref 14–72)

## 2017-12-16 LAB — IRON: IRON: 36 ug/dL (ref 27–159)

## 2017-12-16 LAB — VITAMIN D 25 HYDROXY (VIT D DEFICIENCY, FRACTURES): VIT D 25 HYDROXY: 15.6 ng/mL — AB (ref 30.0–100.0)

## 2017-12-17 ENCOUNTER — Encounter: Payer: Self-pay | Admitting: Family Medicine

## 2017-12-21 MED ORDER — VITAMIN D (ERGOCALCIFEROL) 1.25 MG (50000 UNIT) PO CAPS: 50000.0000 [IU] | ORAL_CAPSULE | ORAL | 0 refills | Status: DC

## 2017-12-22 ENCOUNTER — Ambulatory Visit (INDEPENDENT_AMBULATORY_CARE_PROVIDER_SITE_OTHER): Payer: 59 | Admitting: Family Medicine

## 2017-12-22 ENCOUNTER — Other Ambulatory Visit: Payer: Self-pay

## 2017-12-22 ENCOUNTER — Encounter: Payer: Self-pay | Admitting: Family Medicine

## 2017-12-22 VITALS — BP 108/68 | HR 78 | Temp 98.0°F | Resp 16 | Ht 66.93 in | Wt 161.0 lb

## 2017-12-22 DIAGNOSIS — E860 Dehydration: Secondary | ICD-10-CM

## 2017-12-22 DIAGNOSIS — G43A1 Cyclical vomiting, intractable: Secondary | ICD-10-CM | POA: Diagnosis not present

## 2017-12-22 DIAGNOSIS — R1115 Cyclical vomiting syndrome unrelated to migraine: Secondary | ICD-10-CM

## 2017-12-22 DIAGNOSIS — F329 Major depressive disorder, single episode, unspecified: Secondary | ICD-10-CM

## 2017-12-22 DIAGNOSIS — E538 Deficiency of other specified B group vitamins: Secondary | ICD-10-CM | POA: Diagnosis not present

## 2017-12-22 DIAGNOSIS — E46 Unspecified protein-calorie malnutrition: Secondary | ICD-10-CM | POA: Diagnosis not present

## 2017-12-22 DIAGNOSIS — Z9884 Bariatric surgery status: Secondary | ICD-10-CM | POA: Diagnosis not present

## 2017-12-22 NOTE — Patient Instructions (Signed)
     IF you received an x-ray today, you will receive an invoice from [facility]. Please contact  Radiology at [phone] with questions or concerns regarding your invoice.   IF you received labwork today, you will receive an invoice from [facility]. Please contact [facility] at [phone] with questions or concerns regarding your invoice.   Our billing staff will not be able to assist you with questions regarding bills from these companies.  You will be contacted with the lab results as soon as they are available. The fastest way to get your results is to activate your My Chart account. Instructions are located on the last page of this paperwork. If you have not heard from us regarding the results in 2 weeks, please contact this office.     

## 2017-12-22 NOTE — Progress Notes (Signed)
Subjective:    Patient ID: [name], female    DOB: Jul 14, 1976, 42 y.o.   MRN: [id number]  12/22/2017  Intractable vomiting with nausea (1 week follow-up ) and B12 Injection    HPI This 42 y.o. female presents for one week follow-up of nausea and vomiting.  Feeling better from one week ago.  Feeling better since having bowel movement yesterday and today.  If mixes with coffee.  Had gone four days without having bowel movement.  Last week, after last visit went four days later and then no b.m. For three days and then able to go.  Vomiting is better; less food intake.  Not trying as much.  Protonix twice daily; taking carafate once daily.  Protonix at 5:30am.  Gets up 6:00am; tube feeding.  Alexis advised to stop eating by mouth.  If starts feeling pressure, stops food.  Instead of push with syringe, will just let gravity infuse.  Not able to get appropriate amount.  Waiting on new tube feeds.  Must do 2-4 ounces every 2 hours or so.  Home health has not delivered pump.  Taking Zofran twice daily.   Eight ounces without protein daily.   Eight ounces with protein every 2 hours.  Last vomit yesterday at 4:30pm.    Dizziness is mild to moderate with position changes.   Increased fluoxetine to 16m daily.  Not sad all the time; intermittently sad.  Spent day with dad yesterday; stayed really busy; had a great day.  [place]   Seeing Dr. Lori/psychotherapy every Tuesday for one hour. Alexis/nutritionist seeing patient every month.   BP Readings from Last 3 Encounters:  01/26/18 110/68  01/10/18 111/71  01/01/18 113/80   Wt Readings from Last 3 Encounters:  01/27/18 180 lb 6.4 oz (81.8 kg)  01/26/18 177 lb (80.3 kg)  01/10/18 171 lb 12.8 oz (77.9 kg)   Immunization History  Administered Date(s) Administered  . Influenza,inj,Quad PF,6+ Mos 09/02/2015, 07/21/2016, 05/19/2017  . Influenza-Unspecified 07/30/2014  . Tdap 05/18/2015    Review of  Systems  Constitutional: Negative for chills, diaphoresis, fatigue and fever.  Eyes: Negative for visual disturbance.  Respiratory: Negative for cough and shortness of breath.   Cardiovascular: Negative for chest pain, palpitations and leg swelling.  Gastrointestinal: Positive for nausea. Negative for abdominal pain, anal bleeding, blood in stool, constipation, diarrhea and vomiting.  Endocrine: Negative for cold intolerance, heat intolerance, polydipsia, polyphagia and polyuria.  Neurological: Negative for dizziness, tremors, seizures, syncope, facial asymmetry, speech difficulty, weakness, light-headedness, numbness and headaches.  Psychiatric/Behavioral: Positive for dysphoric mood. Negative for self-injury, sleep disturbance and suicidal ideas. The patient is nervous/anxious.     Past Medical History:  Diagnosis Date  . Anal fissure - posterior 10/16/2014    OCC  ISSUES  . Anxiety    doesn't take anything  . Asthma    has Albuterol inhaler as needed  . Boil    on pubic area started septra 03-01-17 draining blood and pus  . Bronchitis   . Chronic headache disorder 07/22/2016  . Clostridium difficile infection 04/20/2012  . Colitis   . Dehydration   . Depression   . Family history of adverse reaction to anesthesia    pt mom gets sick  . GERD (gastroesophageal reflux disease)    takes Pantoprazole daily  . Headache   . Hiatal hernia    neuropathy - mild in arms and legs  . History of blood transfusion  last transfusion was 04/04/2016=Benadryl was given d/t itching. States she always itches with transfusion.   Marland Kitchen History of bronchitis    > 2 yrs ago  . History of colon polyps    benign  . History of migraine    last one 05/01/16  . History of stomach ulcers   . History of urinary tract infection LAST 2 WEEKS AGO  . IDA (iron deficiency anemia)   . Internal and external bleeding hemorrhoids 06/11/2014  . Joint pain   . Joint swelling   . Left sided chronic colitis - segmental  06/11/2014  . Migraine   . Motion sickness   . Nausea    takes Zofran as needed  . Nausea and vomiting    for 1 year  . Obesity   . Oligouria   . Osteoarthritis    lower back, knees, wrists - no meds  . Pneumonia 1997  . Postoperative nausea and vomiting 01/21/2016   wants scopolamine patch  . SVD (spontaneous vaginal delivery)    x 4  . Tingling    BOTH LOWER EXTREMETIES ALL THE TIME DUE TO RAPID WEIGHT LOSS  . TOBACCO USER 10/02/2009   Qualifier: Diagnosis of  By: Dimas Millin MD, Ellard Artis    . Transfusion history    last transfusion 6'16   . UC (ulcerative colitis) ([place])    supposed to be taking Lialda and Bentyl but has been off since gastric sleeve  . Ulcerative colitis ([place])   . Vertigo    doesn't take any meds   Past Surgical History:  Procedure Laterality Date  . ABDOMINAL HYSTERECTOMY     PARTIAL  . COLONOSCOPY  2007   for rectal bleeding; [facility]  . COLONOSCOPY N/A 06/11/2014   Procedure: COLONOSCOPY;  Surgeon: Gatha Mayer, MD;  Location: [facility];  Service: Endoscopy;  Laterality: N/A;  . COLONOSCOPY WITH PROPOFOL N/A 03/02/2017   Procedure: COLONOSCOPY WITH PROPOFOL;  Surgeon: Alphonsa Overall, MD;  Location: [facility];  Service: General;  Laterality: N/A;  . DILATION AND CURETTAGE OF UTERUS    . ESOPHAGOGASTRODUODENOSCOPY (EGD) WITH PROPOFOL N/A 04/06/2016   Procedure: ESOPHAGOGASTRODUODENOSCOPY (EGD) WITH PROPOFOL;  Surgeon: Alphonsa Overall, MD;  Location: [facility];  Service: General;  Laterality: N/A;  . ESOPHAGOGASTRODUODENOSCOPY (EGD) WITH PROPOFOL N/A 03/02/2017   Procedure: ESOPHAGOGASTRODUODENOSCOPY (EGD) WITH PROPOFOL;  Surgeon: Alphonsa Overall, MD;  Location: [facility];  Service: General;  Laterality: N/A;  . ESOPHAGOGASTRODUODENOSCOPY (EGD) WITH PROPOFOL N/A 05/20/2017   Procedure: ESOPHAGOGASTRODUODENOSCOPY (EGD) WITH PROPOFOL ERAS PATHWAY;  Surgeon: Alphonsa Overall, MD;  Location: [facility];  Service: General;  Laterality: N/A;  . ESOPHAGOGASTRODUODENOSCOPY  (EGD) WITH PROPOFOL N/A 10/07/2017   Procedure: ESOPHAGOGASTRODUODENOSCOPY (EGD) WITH PROPOFOL;  Surgeon: Alphonsa Overall, MD;  Location: [facility];  Service: General;  Laterality: N/A;  . EXCISION OF SKIN TAG  06/08/2017   Procedure: EXCISION OF VULVAR SKIN TAGS X2;  Surgeon: [name], MD;  Location: [facility];  Service: Gynecology;;  . FOOT SURGERY Bilateral    x 2  . GASTRIC ROUX-EN-Y N/A 12/14/2016   Procedure: LAPAROSCOPIC REVISION SLEEVE GASTRECTOMY TO  ROUX-Y-GASTRIC BY-PASS, UPPER ENDO;  Surgeon: Excell Seltzer, MD;  Location: [facility];  Service: General;  Laterality: N/A;  . GASTROJEJUNOSTOMY N/A 05/11/2016   Procedure: LAPAROSCOPIC PLACEMENT  OF FEEDING  JEJUNOSTOMY TUBE;  Surgeon: Excell Seltzer, MD;  Location: [facility];  Service: General;  Laterality: N/A;  . HEMORRHOIDECTOMY WITH HEMORRHOID BANDING    . IR FLUORO GUIDE CV LINE RIGHT  04/06/2017  . IR GENERIC HISTORICAL  05/19/2016   IR CM INJ ANY COLONIC TUBE W/FLUORO 05/19/2016 Aletta Edouard, MD [facility]  . IR PATIENT EVAL TECH 0-60 MINS  11/30/2017  . IR US GUIDE VASC ACCESS RIGHT  04/06/2017  . j tube removed march 2018    . KNEE ARTHROSCOPY Left 09/06/2014  . LAPAROSCOPIC GASTRIC SLEEVE RESECTION N/A 01/14/2016   Procedure: LAPAROSCOPIC GASTRIC SLEEVE RESECTION;  Surgeon: Excell Seltzer, MD;  Location: [facility];  Service: General;  Laterality: N/A;  . LAPAROSCOPIC REVISION OF GASTROJEJUNOSTOMY N/A 10/25/2017   Procedure: LAPAROSCOPIC REVISION OF GASTROJEJUNOSTOMY AND PARTIAL GASTRECTOMY, WITH PLACEMENT OF FEEDING GASTROSTOMY TUBE;  Surgeon: Excell Seltzer, MD;  Location: [facility];  Service: General;  Laterality: N/A;  . LAPAROSCOPIC TUBAL LIGATION  10/16/2011   Procedure: LAPAROSCOPIC TUBAL LIGATION;  Surgeon: Alwyn Pea, MD;  Location: [facility];  Service: Gynecology;  Laterality: Bilateral;  . NOVASURE ABLATION  09/28/2010   mild persistent vaginal bleeding  . right knee arthroscopy     05/07/2016  . TUBAL LIGATION      . VAGINAL HYSTERECTOMY Bilateral 06/08/2017   Procedure: HYSTERECTOMY VAGINAL W/ BILATERAL SALPINGECTOMY;  Surgeon: [name], MD;  Location: [facility];  Service: Gynecology;  Laterality: Bilateral;  . wisdom teeth extracted     Allergies  Allergen Reactions  . Coconut Oil Anaphylaxis and Itching  . Morphine And Related Anaphylaxis and Hives    Pt states that she has tolerated Norco and Dilaudid  . Oxycodone Anaphylaxis  . Ciprofloxacin Itching and Rash  . Doxycycline Nausea And Vomiting  . Adhesive [Tape] Rash    And paper tape causes a rash if wearing for a prolong period of time  . Penicillins Rash    Has patient had a PCN reaction causing immediate rash, facial/tongue/throat swelling, SOB or lightheadedness with hypotension: Yes Has patient had a PCN reaction causing severe rash involving mucus membranes or skin necrosis: No Has patient had a PCN reaction that required hospitalization No Has patient had a PCN reaction occurring within the last 10 years: No If all of the above answers are "NO", then may proceed with Cephalosporin use.   Current Outpatient Medications on File Prior to Visit  Medication Sig Dispense Refill  . acetaminophen (TYLENOL) 500 MG tablet Take 1,000 mg by mouth daily as needed (PAIN).    Marland Kitchen albuterol (PROVENTIL HFA;VENTOLIN HFA) 108 (90 Base) MCG/ACT inhaler Inhale 2 puffs into the lungs every 6 (six) hours as needed for wheezing or shortness of breath. For shortness of breath (Patient taking differently: Inhale 2 puffs into the lungs every 6 (six) hours as needed for wheezing or shortness of breath. ) 54 g 0  . CALCIUM CITRATE PO Take 10 mLs by mouth daily.    . cyanocobalamin (,VITAMIN B-12,) 1000 MCG/ML injection Inject 1,000 mcg into the muscle every 30 (thirty) days.    Marland Kitchen FLUoxetine (PROZAC) 20 MG tablet Take 1 tablet (20 mg total) by mouth daily. 90 tablet 1  . linaclotide (LINZESS) 290 MCG CAPS capsule Take 290 mcg by mouth daily before breakfast.    .  Multiple Vitamins-Minerals (MULTIVITAMIN ADULT) CHEW Chew 1 tablet by mouth 2 (two) times daily.    . ondansetron (ZOFRAN-ODT) 8 MG disintegrating tablet Take 1 tablet (8 mg total) by mouth every 8 (eight) hours as needed for nausea. 20 tablet 5  . pantoprazole (PROTONIX) 40 MG tablet Take 1 tablet (40 mg total) by mouth 2 (two) times daily before a meal.  180 tablet 1  . thiamine (VITAMIN B-1) 100 MG tablet Take 100 mg by mouth daily.     . Vitamin D, Ergocalciferol, (DRISDOL) 50000 units CAPS capsule Take 1 capsule (50,000 Units total) by mouth every 7 (seven) days. 12 capsule 0   Current Facility-Administered Medications on File Prior to Visit  Medication Dose Route Frequency Provider Last Rate Last Dose  . cyanocobalamin ((VITAMIN B-12)) injection 1,000 mcg  1,000 mcg Intramuscular Q30 days Wardell Honour, MD   1,000 mcg at 06/22/17 1537  . cyanocobalamin ((VITAMIN B-12)) injection 1,000 mcg  1,000 mcg Intramuscular Q30 days Wardell Honour, MD   1,000 mcg at 01/26/18 4098   Social History   Socioeconomic History  . Marital status: Single    Spouse name: Not on file  . Number of children: 3  . Years of education: Some college  . Highest education level: Not on file  Occupational History  . Occupation: Production designer, theatre/television/film    Comment: Currently out on disability d/t surg  Social Needs  . Financial resource strain: Not on file  . Food insecurity:    Worry: Not on file    Inability: Not on file  . Transportation needs:    Medical: Not on file    Non-medical: Not on file  Tobacco Use  . Smoking status: Former Smoker    Packs/day: 0.50    Years: 20.00    Pack years: 10.00    Types: Cigarettes    Last attempt to quit: 03/17/2014    Years since quitting: 3.9  . Smokeless tobacco: Never Used  Substance and Sexual Activity  . Alcohol use: No    Alcohol/week: 0.0 oz  . Drug use: No  . Sexual activity: Not Currently    Birth control/protection: Surgical  Lifestyle  . Physical  activity:    Days per week: Not on file    Minutes per session: Not on file  . Stress: Not on file  Relationships  . Social connections:    Talks on phone: Not on file    Gets together: Not on file    Attends religious service: Not on file    Active member of club or organization: Not on file    Attends meetings of clubs or organizations: Not on file    Relationship status: Not on file  . Intimate partner violence:    Fear of current or ex partner: Not on file    Emotionally abused: Not on file    Physically abused: Not on file    Forced sexual activity: Not on file  Other Topics Concern  . Not on file  Social History Narrative   Marital status:  Single; not dating seriously.      Children:  3 sons (35, 30, 71) whose father is deceased, 1 daughter (deceased); no grandchildren      Lives:  With 2 sons      Employment:  Long term disability in 2018 due to PICC line for iv fluids; Corporate treasurer at [employer]      Tobacco: quit June 2015      Alcohol:  None      Drugs:  None      Exercise:  Leg lifts for knee strengthening; walking   1 caffeine beverage daily      Sexual activity:  Active; total partners = up there.  No STDs in past.        Right-handed   Family History  Problem Relation  Age of Onset  . Hypertension Mother   . Diabetes Mother   . Sarcoidosis Mother        lungs and skin  . Asthma Mother   . Hypertension Father   . Diabetes Father   . Asthma Son   . Tics Son   . Asthma Sister   . Cancer Sister        possible pancreatic cancer  . Adrenal disorder Sister        Tumor   . Asthma Brother   . Asthma Daughter        died age 65.5  . Cancer Daughter 4       brain; died age 65.5  . Asthma Son   . Cancer Paternal Aunt        brain, colon, lung and esophagus; unsure of primary        Objective:    BP 108/68   Pulse 78   Temp 98 F (36.7 C)   Resp 16   Ht 5' 6.93" (1.7 m)   Wt 161 lb (73 kg)   LMP 06/02/2017 Comment: hysterectomy 06/08/2017   SpO2 97%   BMI 25.27 kg/m  Physical Exam  Constitutional: She is oriented to person, place, and time. She appears well-developed and well-nourished. No distress.  HENT:  Head: Normocephalic and atraumatic.  Right Ear: External ear normal.  Left Ear: External ear normal.  Nose: Nose normal.  Mouth/Throat: Oropharynx is clear and moist.  Eyes: Pupils are equal, round, and reactive to light. Conjunctivae and EOM are normal.  Neck: Normal range of motion. Neck supple. Carotid bruit is not present. No thyromegaly present.  Cardiovascular: Normal rate, regular rhythm, normal heart sounds and intact distal pulses. Exam reveals no gallop and no friction rub.  No murmur heard. Pulmonary/Chest: Effort normal and breath sounds normal. She has no wheezes. She has no rales.  Abdominal: Soft. Bowel sounds are normal. She exhibits no distension and no mass. There is generalized tenderness. There is no rebound and no guarding.  Lymphadenopathy:    She has no cervical adenopathy.  Neurological: She is alert and oriented to person, place, and time. No cranial nerve deficit.  Skin: Skin is warm and dry. No rash noted. She is not diaphoretic. No erythema. No pallor.  Psychiatric: She has a normal mood and affect. Her speech is normal and behavior is normal. Judgment normal. Cognition and memory are normal.   No results found. Depression screen [facility] 2/9 01/26/2018 12/22/2017 12/15/2017 09/22/2017 07/26/2017  Decreased Interest 2 1 3 1  0  Down, Depressed, Hopeless 2 1 3 1  0  PHQ - 2 Score 4 2 6 2  0  Altered sleeping 1 - 3 1 -  Tired, decreased energy 1 - 3 1 -  Change in appetite 1 - 3 0 -  Feeling bad or failure about yourself  1 - 3 1 -  Trouble concentrating - - 3 0 -  Moving slowly or fidgety/restless - - 3 0 -  Suicidal thoughts - - 0 0 -  PHQ-9 Score 8 - 24 5 -  Difficult doing work/chores - - - - -  Some recent data might be hidden   Fall Risk  01/26/2018 01/01/2018 12/22/2017 12/15/2017 10/30/2017    Falls in the past year? No Yes No No No  Number falls in past yr: - 2 or more - - -  Injury with Fall? - Yes - - -  Comment - - - - -  Follow  up - - - - -   No data found.      Assessment & Plan:   1. Dehydration   2. Intractable cyclical vomiting with nausea   3. S/P laparoscopic sleeve gastrectomy   4. Reactive depression   5. Malnutrition, calorie ([place])   6. B12 deficiency     Dehydration secondary to chronic vomiting: Improved from last week.  Decreased vomiting in the last week.  Followed closely by general surgery who performed gastric sleeve and gastric bypass surgeries.  Receiving tube feedings to assist with malnutrition.  Reactive depression: Improving with fluoxetine therapy and psychotherapy.  Encourage ongoing psychotherapy.  Vitamin B12 deficiency: Stable at this time.  Status post injection in office today.  Orders Placed This Encounter  Procedures  . Orthostatic vital signs   No orders of the defined types were placed in this encounter.   Return in about 1 month (around 01/19/2018) for recheck.   Benno Brensinger Elayne Guerin, M.D. Primary Care at [facility] previously [facility] [address] [place], [place]  [address] [phone] phone [phone] fax

## 2017-12-25 DIAGNOSIS — K922 Gastrointestinal hemorrhage, unspecified: Secondary | ICD-10-CM | POA: Diagnosis not present

## 2017-12-25 DIAGNOSIS — K9423 Gastrostomy malfunction: Secondary | ICD-10-CM | POA: Diagnosis not present

## 2017-12-25 DIAGNOSIS — R112 Nausea with vomiting, unspecified: Secondary | ICD-10-CM | POA: Diagnosis not present

## 2017-12-26 DIAGNOSIS — K9423 Gastrostomy malfunction: Secondary | ICD-10-CM | POA: Diagnosis not present

## 2017-12-26 DIAGNOSIS — K289 Gastrojejunal ulcer, unspecified as acute or chronic, without hemorrhage or perforation: Secondary | ICD-10-CM | POA: Diagnosis not present

## 2017-12-26 DIAGNOSIS — Z9884 Bariatric surgery status: Secondary | ICD-10-CM | POA: Diagnosis not present

## 2017-12-27 DIAGNOSIS — R112 Nausea with vomiting, unspecified: Secondary | ICD-10-CM | POA: Diagnosis not present

## 2017-12-27 DIAGNOSIS — K9423 Gastrostomy malfunction: Secondary | ICD-10-CM | POA: Diagnosis not present

## 2017-12-27 DIAGNOSIS — K922 Gastrointestinal hemorrhage, unspecified: Secondary | ICD-10-CM | POA: Diagnosis not present

## 2017-12-31 ENCOUNTER — Ambulatory Visit: Payer: Self-pay | Admitting: *Deleted

## 2017-12-31 DIAGNOSIS — K289 Gastrojejunal ulcer, unspecified as acute or chronic, without hemorrhage or perforation: Secondary | ICD-10-CM | POA: Diagnosis not present

## 2017-12-31 DIAGNOSIS — Z9884 Bariatric surgery status: Secondary | ICD-10-CM | POA: Diagnosis not present

## 2017-12-31 DIAGNOSIS — K9423 Gastrostomy malfunction: Secondary | ICD-10-CM | POA: Diagnosis not present

## 2017-12-31 NOTE — Telephone Encounter (Signed)
I returned her call.   The [facility] nurse did a visit and informed the pt she needed to contact Dr. Tamala Julian regarding the dizziness and 2 falls she has had this week.  She said her dizziness was normal for her but not this bad.   She is on a feeding tube with now 24 hour  feedings.  She was on bolus feedings and thinks she was dehydrated.    When she fell she is now c/o having a bruise on her right shinn, pain in her right wrist, right arm between elbow and shoulder, and right hip pain since falling.   "I hate to complain".   She had a hard time answering my questions during triage.   "I just don't complain much so I don't know how to describe some of these things".   As I was wrapping up the call she mentioned having tingling in her face.   She had a difficult time describing to me the tingling.   She was able to say it was like when your arm or leg goes to sleep and wakes up.   "I don't know if it comes or goes or not because I don't always notice it".   "It's tingling now in both of my cheeks".    I made her an appt with Dr. Tamala Julian for Saturday 01/01/18 at 8:20am.    I instructed her to call 911 if she fainted or passed out from the dizziness.    Reason for Disposition . [1] MODERATE dizziness (e.g., interferes with normal activities) AND [2] has NOT been evaluated by physician for this  (Exception: dizziness caused by heat exposure, sudden standing, or poor fluid intake)  Answer Assessment - Initial Assessment Questions 1. DESCRIPTION: "Describe your dizziness."     I fell twice this week.   The dizziness happens when I sit up or get up too quickly.   This is normal for me.   The [facility] Nurse came by today and she has to notify you when I've fallen.   I've restarted my feeding tube.   I was on bolus feeds and now I'm on the machine. 2. LIGHTHEADED: "Do you feel lightheaded?" (e.g., somewhat faint, woozy, weak upon standing)     Yes.   This is normal for me at times.  I see her in  a month to get a B12 shot. 3. VERTIGO: "Do you feel like either you or the room is spinning or tilting?" (i.e. vertigo)     Light headed only.   No spinning.   My ears feel like they have seashells over them.   4. SEVERITY: "How bad is it?"  "Do you feel like you are going to faint?" "Can you stand and walk?"   - MILD - walking normally   - MODERATE - interferes with normal activities (e.g., work, school)    - SEVERE - unable to stand, requires support to walk, feels like passing out now.      Still having a little dizziness this morning.   Not as bad. 5. ONSET:  "When did the dizziness begin?"     It's becoming my norm.    Just this week did I fall twice. 6. AGGRAVATING FACTORS: "Does anything make it worse?" (e.g., standing, change in head position)     Getting up quickly from sitting and laying down. 7. HEART RATE: "Can you tell me your heart rate?" "How many beats in 15 seconds?"  (Note: not  all patients can do this)       Not asked 8. CAUSE: "What do you think is causing the dizziness?"     I get up too fast.   I try to move slow but that's what I think. 9. RECURRENT SYMPTOM: "Have you had dizziness before?" If so, ask: "When was the last time?" "What happened that time?"     Yes.   Usually it was because I was dehydrated.   But now I'm on a 24 hour feed via my feeding tube. 10. OTHER SYMPTOMS: "Do you have any other symptoms?" (e.g., fever, chest pain, vomiting, diarrhea, bleeding)       Bruised right shinn, right arm hurting between elbow and shoulder, right wrist hurting, and right hip is hurting too from the fall. 11. PREGNANCY: "Is there any chance you are pregnant?" "When was your last menstrual period?"       No  Hysterectomy  Protocols used: DIZZINESS [facility]

## 2018-01-01 ENCOUNTER — Ambulatory Visit (INDEPENDENT_AMBULATORY_CARE_PROVIDER_SITE_OTHER): Payer: 59

## 2018-01-01 ENCOUNTER — Encounter: Payer: Self-pay | Admitting: Family Medicine

## 2018-01-01 ENCOUNTER — Ambulatory Visit (INDEPENDENT_AMBULATORY_CARE_PROVIDER_SITE_OTHER): Payer: 59 | Admitting: Family Medicine

## 2018-01-01 ENCOUNTER — Other Ambulatory Visit: Payer: Self-pay

## 2018-01-01 VITALS — BP 113/80 | HR 86 | Temp 98.6°F | Resp 16 | Ht 66.0 in | Wt 169.0 lb

## 2018-01-01 DIAGNOSIS — K9423 Gastrostomy malfunction: Secondary | ICD-10-CM | POA: Diagnosis not present

## 2018-01-01 DIAGNOSIS — S4991XA Unspecified injury of right shoulder and upper arm, initial encounter: Secondary | ICD-10-CM | POA: Diagnosis not present

## 2018-01-01 DIAGNOSIS — K922 Gastrointestinal hemorrhage, unspecified: Secondary | ICD-10-CM | POA: Diagnosis not present

## 2018-01-01 DIAGNOSIS — S40021A Contusion of right upper arm, initial encounter: Secondary | ICD-10-CM | POA: Diagnosis not present

## 2018-01-01 DIAGNOSIS — M79601 Pain in right arm: Secondary | ICD-10-CM

## 2018-01-01 DIAGNOSIS — Z9884 Bariatric surgery status: Secondary | ICD-10-CM | POA: Diagnosis not present

## 2018-01-01 DIAGNOSIS — R55 Syncope and collapse: Secondary | ICD-10-CM | POA: Diagnosis not present

## 2018-01-01 DIAGNOSIS — M25551 Pain in right hip: Secondary | ICD-10-CM

## 2018-01-01 DIAGNOSIS — R42 Dizziness and giddiness: Secondary | ICD-10-CM

## 2018-01-01 DIAGNOSIS — E46 Unspecified protein-calorie malnutrition: Secondary | ICD-10-CM | POA: Diagnosis not present

## 2018-01-01 DIAGNOSIS — G43A1 Cyclical vomiting, intractable: Secondary | ICD-10-CM | POA: Diagnosis not present

## 2018-01-01 DIAGNOSIS — S79911A Unspecified injury of right hip, initial encounter: Secondary | ICD-10-CM | POA: Diagnosis not present

## 2018-01-01 DIAGNOSIS — S7001XA Contusion of right hip, initial encounter: Secondary | ICD-10-CM | POA: Diagnosis not present

## 2018-01-01 DIAGNOSIS — R112 Nausea with vomiting, unspecified: Secondary | ICD-10-CM | POA: Diagnosis not present

## 2018-01-01 DIAGNOSIS — R1115 Cyclical vomiting syndrome unrelated to migraine: Secondary | ICD-10-CM

## 2018-01-01 DIAGNOSIS — M79644 Pain in right finger(s): Secondary | ICD-10-CM | POA: Diagnosis not present

## 2018-01-01 LAB — POCT CBC
GRANULOCYTE PERCENT: 44.1 % (ref 37–80)
HCT, POC: 29.3 % — AB (ref 37.7–47.9)
HEMOGLOBIN: 9.2 g/dL — AB (ref 12.2–16.2)
Lymph, poc: 2.6 (ref 0.6–3.4)
MCH: 23.6 pg — AB (ref 27–31.2)
MCHC: 31.4 g/dL — AB (ref 31.8–35.4)
MCV: 75.3 fL — AB (ref 80–97)
MID (CBC): 0.5 (ref 0–0.9)
MPV: 8.9 fL (ref 0–99.8)
POC Granulocyte: 2.4 (ref 2–6.9)
POC LYMPH PERCENT: 47.2 %L (ref 10–50)
POC MID %: 8.7 %M (ref 0–12)
Platelet Count, POC: 320 10*3/uL (ref 142–424)
RBC: 3.89 M/uL — AB (ref 4.04–5.48)
RDW, POC: 20 %
WBC: 5.5 10*3/uL (ref 4.6–10.2)

## 2018-01-01 LAB — POCT URINALYSIS DIP (MANUAL ENTRY)
BILIRUBIN UA: NEGATIVE
Blood, UA: NEGATIVE
GLUCOSE UA: NEGATIVE mg/dL
Ketones, POC UA: NEGATIVE mg/dL
LEUKOCYTES UA: NEGATIVE
Nitrite, UA: NEGATIVE
PROTEIN UA: NEGATIVE mg/dL
Spec Grav, UA: 1.015 (ref 1.010–1.025)
Urobilinogen, UA: 0.2 E.U./dL
pH, UA: 7.5 (ref 5.0–8.0)

## 2018-01-01 LAB — GLUCOSE, POCT (MANUAL RESULT ENTRY): POC Glucose: 97 mg/dl (ref 70–99)

## 2018-01-01 NOTE — Progress Notes (Signed)
Subjective:    Patient ID: [name], female    DOB: 28-Nov-1975, 42 y.o.   MRN: [id number]  01/01/2018  Dizziness (x 3 days); Nausea; and Fall (pt hurt her right side falling, arm, hip , and shoulder)    HPI This 42 y.o. female presents for an acute visit.   Has fallen twice in the past two weeks due to dizziness.  Started feeding bag one week ago.  Has gained eight pounds in past week since starting feeding bag.  Initial goal weight of 210 yet went past that when transitioned to gastric bypass roux-n-y.  The first fall the children were arguing; went into room quickly; passed out that time; argued over stress.  Children witnessed syncopal event; no head trauma; hit R shoulder, R arm, R hip; pain; first fall one week ago.   Nurse visit yesterday; second fall was yesterday, getting ready to take children to school; got up quickly to tell children to get ready; landed on leg.  Yellow bruises; R lower leg. No head trauma.  No full syncope yesterday. 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  Continues to suffer with nausea.  Only vomits if he attempts to eat.  Denies chest pain, palpitations, shortness of breath, diaphoresis.  Status post cardiology follow-up recently and due to return for follow-up.  Denies head trauma with 2 falls.  Denies headache.  Does suffer with intermittent tinnitus.  Denies nasal congestion or rhinorrhea.  Denies blurred vision or diplopia.  Has been suffering with intermittent tingling in bilateral facial region since initiating continuous feeds.  Denies focal weakness.  Suffering with pain mostly along the right humerus region.  Also second with right shoulder pain since fall.  Right hip is also tender he is ambulating without difficulty.  Denies numbness tingling or burning in extremities.  Denies swelling of any joints.  Continuous feeding; follow-up with surgeon on 01/07/18.   BP Readings from Last 3 Encounters:  01/01/18 113/80  12/22/17 108/68  12/15/17 110/68   Wt  Readings from Last 3 Encounters:  01/01/18 169 lb (76.7 kg)  12/22/17 161 lb (73 kg)  12/15/17 163 lb (73.9 kg)   Immunization History  Administered Date(s) Administered  . Influenza,inj,Quad PF,6+ Mos 09/02/2015, 07/21/2016, 05/19/2017  . Influenza-Unspecified 07/30/2014  . Tdap 05/18/2015    Review of Systems  Constitutional: Negative for chills, diaphoresis, fatigue and fever.  HENT: Positive for tinnitus. Negative for congestion, ear pain, hearing loss, postnasal drip, rhinorrhea, sinus pressure, sinus pain, sneezing, sore throat, trouble swallowing and voice change.   Eyes: Negative for photophobia, pain, discharge, redness, itching and visual disturbance.  Respiratory: Negative for cough and shortness of breath.   Cardiovascular: Negative for chest pain, palpitations and leg swelling.  Gastrointestinal: Positive for nausea. Negative for abdominal distention, abdominal pain, anal bleeding, blood in stool, constipation, diarrhea and vomiting.  Endocrine: Negative for cold intolerance, heat intolerance, polydipsia, polyphagia and polyuria.  Musculoskeletal: Positive for arthralgias, back pain, myalgias and neck pain. Negative for gait problem, joint swelling and neck stiffness.  Skin: Negative for color change, pallor, rash and wound.  Neurological: Negative for dizziness, tremors, seizures, syncope, facial asymmetry, speech difficulty, weakness, light-headedness, numbness and headaches.    Past Medical History:  Diagnosis Date  . Anal fissure - posterior 10/16/2014    OCC  ISSUES  . Anxiety    doesn't take anything  . Asthma    has Albuterol inhaler as needed  . Boil    on pubic area started septra  03-01-17 draining blood and pus  . Bronchitis   . Chronic headache disorder 07/22/2016  . Clostridium difficile infection 04/20/2012  . Colitis   . Dehydration   . Depression   . Family history of adverse reaction to anesthesia    pt mom gets sick  . GERD (gastroesophageal  reflux disease)    takes Pantoprazole daily  . Headache   . Hiatal hernia    neuropathy - mild in arms and legs  . History of blood transfusion    last transfusion was 04/04/2016=Benadryl was given d/t itching. States she always itches with transfusion.   Marland Kitchen History of bronchitis    > 2 yrs ago  . History of colon polyps    benign  . History of migraine    last one 05/01/16  . History of stomach ulcers   . History of urinary tract infection LAST 2 WEEKS AGO  . IDA (iron deficiency anemia)   . Internal and external bleeding hemorrhoids 06/11/2014  . Joint pain   . Joint swelling   . Left sided chronic colitis - segmental 06/11/2014  . Migraine   . Motion sickness   . Nausea    takes Zofran as needed  . Nausea and vomiting    for 1 year  . Obesity   . Oligouria   . Osteoarthritis    lower back, knees, wrists - no meds  . Pneumonia 1997  . Postoperative nausea and vomiting 01/21/2016   wants scopolamine patch  . SVD (spontaneous vaginal delivery)    x 4  . Tingling    BOTH LOWER EXTREMETIES ALL THE TIME DUE TO RAPID WEIGHT LOSS  . TOBACCO USER 10/02/2009   Qualifier: Diagnosis of  By: Dimas Millin MD, Ellard Artis    . Transfusion history    last transfusion 6'16   . UC (ulcerative colitis) ([place])    supposed to be taking Lialda and Bentyl but has been off since gastric sleeve  . Ulcerative colitis ([place])   . Vertigo    doesn't take any meds   Past Surgical History:  Procedure Laterality Date  . ABDOMINAL HYSTERECTOMY     PARTIAL  . COLONOSCOPY  2007   for rectal bleeding; [facility]  . COLONOSCOPY N/A 06/11/2014   Procedure: COLONOSCOPY;  Surgeon: Gatha Mayer, MD;  Location: [facility];  Service: Endoscopy;  Laterality: N/A;  . COLONOSCOPY WITH PROPOFOL N/A 03/02/2017   Procedure: COLONOSCOPY WITH PROPOFOL;  Surgeon: Alphonsa Overall, MD;  Location: [facility];  Service: General;  Laterality: N/A;  . DILATION AND CURETTAGE OF UTERUS    . ESOPHAGOGASTRODUODENOSCOPY (EGD) WITH PROPOFOL N/A  04/06/2016   Procedure: ESOPHAGOGASTRODUODENOSCOPY (EGD) WITH PROPOFOL;  Surgeon: Alphonsa Overall, MD;  [facility]: [facility];  Service: General;  Laterality: N/A;  . ESOPHAGOGASTRODUODENOSCOPY (EGD) WITH PROPOFOL N/A 03/02/2017   Procedure: ESOPHAGOGASTRODUODENOSCOPY (EGD) WITH PROPOFOL;  Surgeon: Alphonsa Overall, MD;  Location: [facility];  Service: General;  Laterality: N/A;  . ESOPHAGOGASTRODUODENOSCOPY (EGD) WITH PROPOFOL N/A 05/20/2017   Procedure: ESOPHAGOGASTRODUODENOSCOPY (EGD) WITH PROPOFOL ERAS PATHWAY;  Surgeon: Alphonsa Overall, MD;  Location: [facility];  Service: General;  Laterality: N/A;  . ESOPHAGOGASTRODUODENOSCOPY (EGD) WITH PROPOFOL N/A 10/07/2017   Procedure: ESOPHAGOGASTRODUODENOSCOPY (EGD) WITH PROPOFOL;  Surgeon: Alphonsa Overall, MD;  Location: [facility];  Service: General;  Laterality: N/A;  . EXCISION OF SKIN TAG  06/08/2017   Procedure: EXCISION OF VULVAR SKIN TAGS X2;  Surgeon: [name], MD;  Location: [facility];  Service: Gynecology;;  . FOOT SURGERY  Bilateral    x 2  . GASTRIC ROUX-EN-Y N/A 12/14/2016   Procedure: LAPAROSCOPIC REVISION SLEEVE GASTRECTOMY TO  ROUX-Y-GASTRIC BY-PASS, UPPER ENDO;  Surgeon: Excell Seltzer, MD;  Location: [facility];  Service: General;  Laterality: N/A;  . GASTROJEJUNOSTOMY N/A 05/11/2016   Procedure: LAPAROSCOPIC PLACEMENT  OF FEEDING  JEJUNOSTOMY TUBE;  Surgeon: Excell Seltzer, MD;  Location: [facility];  Service: General;  Laterality: N/A;  . HEMORRHOIDECTOMY WITH HEMORRHOID BANDING    . IR FLUORO GUIDE CV LINE RIGHT  04/06/2017  . IR GENERIC HISTORICAL  05/19/2016   IR CM INJ ANY COLONIC TUBE W/FLUORO 05/19/2016 Aletta Edouard, MD [facility]  . IR PATIENT EVAL TECH 0-60 MINS  11/30/2017  . IR US GUIDE VASC ACCESS RIGHT  04/06/2017  . j tube removed march 2018    . KNEE ARTHROSCOPY Left 09/06/2014  . LAPAROSCOPIC GASTRIC SLEEVE RESECTION N/A 01/14/2016   Procedure: LAPAROSCOPIC GASTRIC SLEEVE RESECTION;  Surgeon: Excell Seltzer, MD;   Location: [facility];  Service: General;  Laterality: N/A;  . LAPAROSCOPIC REVISION OF GASTROJEJUNOSTOMY N/A 10/25/2017   Procedure: LAPAROSCOPIC REVISION OF GASTROJEJUNOSTOMY AND PARTIAL GASTRECTOMY, WITH PLACEMENT OF FEEDING GASTROSTOMY TUBE;  Surgeon: Excell Seltzer, MD;  Location: [facility];  Service: General;  Laterality: N/A;  . LAPAROSCOPIC TUBAL LIGATION  10/16/2011   Procedure: LAPAROSCOPIC TUBAL LIGATION;  Surgeon: Alwyn Pea, MD;  Location: [facility];  Service: Gynecology;  Laterality: Bilateral;  . NOVASURE ABLATION  09/28/2010   mild persistent vaginal bleeding  . right knee arthroscopy     05/07/2016  . TUBAL LIGATION    . VAGINAL HYSTERECTOMY Bilateral 06/08/2017   Procedure: HYSTERECTOMY VAGINAL W/ BILATERAL SALPINGECTOMY;  Surgeon: [name], MD;  Location: [facility];  Service: Gynecology;  Laterality: Bilateral;  . wisdom teeth extracted     Allergies  Allergen Reactions  . Coconut Oil Anaphylaxis and Itching  . Morphine And Related Anaphylaxis and Hives    Pt states that she has tolerated Norco and Dilaudid  . Oxycodone Anaphylaxis  . Ciprofloxacin Itching and Rash  . Doxycycline Nausea And Vomiting  . Adhesive [Tape] Rash    And paper tape causes a rash if wearing for a prolong period of time  . Penicillins Rash    Has patient had a PCN reaction causing immediate rash, facial/tongue/throat swelling, SOB or lightheadedness with hypotension: Yes Has patient had a PCN reaction causing severe rash involving mucus membranes or skin necrosis: No Has patient had a PCN reaction that required hospitalization No Has patient had a PCN reaction occurring within the last 10 years: No If all of the above answers are "NO", then may proceed with Cephalosporin use.   Current Outpatient Medications on File Prior to Visit  Medication Sig Dispense Refill  . acetaminophen (TYLENOL) 500 MG tablet Take 1,000 mg by mouth daily as needed (PAIN).    Marland Kitchen albuterol (PROVENTIL HFA;VENTOLIN HFA) 108  (90 Base) MCG/ACT inhaler Inhale 2 puffs into the lungs every 6 (six) hours as needed for wheezing or shortness of breath. For shortness of breath (Patient taking differently: Inhale 2 puffs into the lungs every 6 (six) hours as needed for wheezing or shortness of breath. ) 54 g 0  . CALCIUM CITRATE PO Take 10 mLs by mouth daily.    . cyanocobalamin (,VITAMIN B-12,) 1000 MCG/ML injection Inject 1,000 mcg into the muscle every 30 (thirty) days.    Marland Kitchen FLUoxetine (PROZAC) 20 MG tablet Take 1 tablet (20 mg total) by mouth daily. [place]  tablet 1  . linaclotide (LINZESS) 290 MCG CAPS capsule Take 290 mcg by mouth daily before breakfast.    . Multiple Vitamins-Minerals (MULTIVITAMIN ADULT) CHEW Chew 1 tablet by mouth 2 (two) times daily.    . ondansetron (ZOFRAN-ODT) 8 MG disintegrating tablet Take 1 tablet (8 mg total) by mouth every 8 (eight) hours as needed for nausea. 20 tablet 5  . pantoprazole (PROTONIX) 40 MG tablet Take 1 tablet (40 mg total) by mouth 2 (two) times daily before a meal. 180 tablet 1  . thiamine (VITAMIN B-1) 100 MG tablet Take 100 mg by mouth daily.     . Vitamin D, Ergocalciferol, (DRISDOL) 50000 units CAPS capsule Take 1 capsule (50,000 Units total) by mouth every 7 (seven) days. 12 capsule 0   Current Facility-Administered Medications on File Prior to Visit  Medication Dose Route Frequency Provider Last Rate Last Dose  . cyanocobalamin ((VITAMIN B-12)) injection 1,000 mcg  1,000 mcg Intramuscular Q30 days Wardell Honour, MD   1,000 mcg at 06/22/17 1537  . cyanocobalamin ((VITAMIN B-12)) injection 1,000 mcg  1,000 mcg Intramuscular Q30 days Wardell Honour, MD   1,000 mcg at 12/22/17 1128   Social History   Socioeconomic History  . Marital status: Single    Spouse name: Not on file  . Number of children: 3  . Years of education: Some college  . Highest education level: Not on file  Occupational History  . Occupation: Production designer, theatre/television/film    Comment: Currently out on disability  d/t surg  Social Needs  . Financial resource strain: Not on file  . Food insecurity:    Worry: Not on file    Inability: Not on file  . Transportation needs:    Medical: Not on file    Non-medical: Not on file  Tobacco Use  . Smoking status: Former Smoker    Packs/day: 0.50    Years: 20.00    Pack years: 10.00    Types: Cigarettes    Last attempt to quit: 03/17/2014    Years since quitting: 3.7  . Smokeless tobacco: Never Used  Substance and Sexual Activity  . Alcohol use: No    Alcohol/week: 0.0 oz  . Drug use: No  . Sexual activity: Not Currently    Birth control/protection: Surgical  Lifestyle  . Physical activity:    Days per week: Not on file    Minutes per session: Not on file  . Stress: Not on file  Relationships  . Social connections:    Talks on phone: Not on file    Gets together: Not on file    Attends religious service: Not on file    Active member of club or organization: Not on file    Attends meetings of clubs or organizations: Not on file    Relationship status: Not on file  . Intimate partner violence:    Fear of current or ex partner: Not on file    Emotionally abused: Not on file    Physically abused: Not on file    Forced sexual activity: Not on file  Other Topics Concern  . Not on file  Social History Narrative   Marital status:  Single; not dating seriously.      Children:  3 sons (32, 54, 59) whose father is deceased, 1 daughter (deceased); no grandchildren      Lives:  With 2 sons      Employment:  Long term disability in 2018 due to PICC line  for iv fluids; Corporate treasurer at [employer]      Tobacco: quit June 2015      Alcohol:  None      Drugs:  None      Exercise:  Leg lifts for knee strengthening; walking   1 caffeine beverage daily      Sexual activity:  Active; total partners = up there.  No STDs in past.        Right-handed   Family History  Problem Relation Age of Onset  . Hypertension Mother   . Diabetes Mother   .  Sarcoidosis Mother        lungs and skin  . Asthma Mother   . Hypertension Father   . Diabetes Father   . Asthma Son   . Tics Son   . Asthma Sister   . Cancer Sister        possible pancreatic cancer  . Adrenal disorder Sister        Tumor   . Asthma Brother   . Asthma Daughter        died age 258.5  . Cancer Daughter 4       brain; died age 258.5  . Asthma Son   . Cancer Paternal Aunt        brain, colon, lung and esophagus; unsure of primary        Objective:    BP 113/80   Pulse 86   Temp 98.6 F (37 C) (Oral)   Resp 16   Ht 5' 6"  (1.676 m)   Wt 169 lb (76.7 kg)   LMP 06/02/2017 Comment: hysterectomy 06/08/2017  SpO2 99%   BMI 27.28 kg/m  Physical Exam  Constitutional: She is oriented to person, place, and time. She appears well-developed and well-nourished. No distress.  HENT:  Head: Normocephalic and atraumatic.  Right Ear: External ear normal.  Left Ear: External ear normal.  Nose: Nose normal.  Mouth/Throat: Oropharynx is clear and moist.  Eyes: Pupils are equal, round, and reactive to light. Conjunctivae and EOM are normal.  Neck: Normal range of motion. Neck supple. Carotid bruit is not present. No thyromegaly present.  Cardiovascular: Normal rate, regular rhythm, normal heart sounds and intact distal pulses. Exam reveals no gallop and no friction rub.  No murmur heard. Pulmonary/Chest: Effort normal and breath sounds normal. She has no wheezes. She has no rales.  Abdominal: Soft. Bowel sounds are normal. She exhibits no distension and no mass. There is no tenderness. There is no rebound and no guarding.  G tube present.  Musculoskeletal:       Right shoulder: She exhibits pain. She exhibits normal range of motion, no tenderness, no bony tenderness, no spasm and normal pulse.       Right elbow: Normal.She exhibits normal range of motion, no swelling, no effusion, no deformity and no laceration. No tenderness found.       Right wrist: She exhibits tenderness  and bony tenderness. She exhibits normal range of motion, no swelling, no effusion and no crepitus.       Right upper arm: She exhibits tenderness and bony tenderness. She exhibits no swelling, no edema, no deformity and no laceration.       Right forearm: Normal. She exhibits no tenderness and no bony tenderness.  Lymphadenopathy:    She has no cervical adenopathy.  Neurological: She is alert and oriented to person, place, and time. No cranial nerve deficit. She exhibits normal muscle tone. Coordination normal.  Skin: Skin is warm and dry. No rash noted. She is not diaphoretic. No erythema. No pallor.  Psychiatric: She has a normal mood and affect. Her behavior is normal. Judgment and thought content normal.   Dg Humerus Right  Result Date: 01/01/2018 CLINICAL DATA:  Pain after fall EXAM: RIGHT HUMERUS - 2+ VIEW COMPARISON:  None. FINDINGS: There is no evidence of fracture or other focal bone lesions. Soft tissues are unremarkable. IMPRESSION: Negative. Electronically Signed   By: Dorise Bullion III M.D   On: 01/01/2018 09:15   Dg Hip Unilat W Or W/o Pelvis 2-3 Views Right  Result Date: 01/01/2018 CLINICAL DATA:  Pain after fall EXAM: DG HIP (WITH OR WITHOUT PELVIS) 2-3V RIGHT COMPARISON:  None. FINDINGS: There is no evidence of hip fracture or dislocation. There is no evidence of arthropathy or other focal bone abnormality. IMPRESSION: Negative. Electronically Signed   By: Dorise Bullion III M.D   On: 01/01/2018 09:16   Depression screen [facility] 2/9 12/22/2017 12/15/2017 09/22/2017 07/26/2017 06/23/2017  Decreased Interest 1 3 1  0 2  Down, Depressed, Hopeless 1 3 1  0 2  PHQ - 2 Score 2 6 2  0 4  Altered sleeping - 3 1 - 2  Tired, decreased energy - 3 1 - 2  Change in appetite - 3 0 - 3  Feeling bad or failure about yourself  - 3 1 - 1  Trouble concentrating - 3 0 - 0  Moving slowly or fidgety/restless - 3 0 - 0  Suicidal thoughts - 0 0 - -  PHQ-9 Score - 24 5 - 12  Difficult doing work/chores  - - - - -  Some recent data might be hidden   Fall Risk  01/01/2018 12/22/2017 12/15/2017 10/30/2017 09/22/2017  Falls in the past year? Yes No No No Yes  Number falls in past yr: 2 or more - - - 2 or more  Injury with Fall? Yes - - - No  Comment - - - - -  Follow up - - - - -    Orthostatic VS for the past 24 hrs:  BP- Lying Pulse- Lying BP- Sitting Pulse- Sitting BP- Standing at 0 minutes Pulse- Standing at 0 minutes  01/01/18 0828 113/76 81 113/80 99 95/78 86   EKG: Normal sinus rhythm with rate of 68.  No ST changes and no arrhythmia.  Results for orders placed or performed in visit on 01/01/18  POCT urinalysis dipstick  Result Value Ref Range   Color, UA yellow yellow   Clarity, UA clear clear   Glucose, UA negative negative mg/dL   Bilirubin, UA negative negative   Ketones, POC UA negative negative mg/dL   Spec Grav, UA 1.015 1.010 - 1.025   Blood, UA negative negative   pH, UA 7.5 5.0 - 8.0   Protein Ur, POC negative negative mg/dL   Urobilinogen, UA 0.2 0.2 or 1.0 E.U./dL   Nitrite, UA Negative Negative   Leukocytes, UA Negative Negative  POCT CBC  Result Value Ref Range   WBC 5.5 4.6 - 10.2 K/uL   Lymph, poc 2.6 0.6 - 3.4   POC LYMPH PERCENT 47.2 10 - 50 %L   MID (cbc) 0.5 0 - 0.9   POC MID % 8.7 0 - 12 %M   POC Granulocyte 2.4 2 - 6.9   Granulocyte percent 44.1 37 - 80 %G   RBC 3.89 (A) 4.04 - 5.48 M/uL   Hemoglobin 9.2 (A) 12.2 - 16.2 g/dL  HCT, POC 29.3 (A) 37.7 - 47.9 %   MCV 75.3 (A) 80 - 97 fL   MCH, POC 23.6 (A) 27 - 31.2 pg   MCHC 31.4 (A) 31.8 - 35.4 g/dL   RDW, POC 20.0 %   Platelet Count, POC 320 142 - 424 K/uL   MPV 8.9 0 - 99.8 fL  POCT glucose (manual entry)  Result Value Ref Range   POC Glucose 97 70 - 99 mg/dl       Assessment & Plan:   1. Right arm pain   2. Right hip pain   3. Dizziness   4. Syncope and collapse   5. Intractable cyclical vomiting with nausea   6. Malnutrition, calorie ([place])   7. S/P laparoscopic sleeve gastrectomy    8. Contusion of right upper arm, initial encounter   9. Contusion of right hip, initial encounter     Contusion right upper arm: New onset secondary to fall due to orthostatic hypotension suspected.  Tender to palpation along middle numerous region.  X-ray negative today.  Recommend supportive care with rest, stretches, avoiding heavy lifting.  Right hip contusion: New.  Secondary to fall due to orthostatic hypotension with dizziness.  Recommend rest, frequent ambulation, icing.  Dizziness: Worsening.  Onset twice in the past week due to orthostatic hypotension.  Increase water intake.  One episode of syncope associated with dizziness prior to recent continuous feedings.  Obtain labs to rule out secondary causes.  EKG obtained and normal in office today.  Benign neurological cardiac exams in office.  Syncope: Secondary to dizziness due to orthostatic hypotension.  Currently undergoing cardiac evaluation and syncopal event occurred 1 week ago.  Has upcoming follow-up with cardiology and recommend reviewing syncopal event with cardiology at that time.  Chronic nausea and vomiting: Following gastric sleeve and Roux-en-Y surgeries.  Status post recent gastric ulcer resection by general surgery.  Suffering with malnutrition and dehydration.  Recently initiated continuous feeding per general surgery.  Continues to suffer with persistent nausea and only vomits if he eats by mouth.  Malnutrition: Secondary to chronic nausea and vomiting following gastric bypass surgery.  Initiated continuous feeds last week and is gained 8 pounds in 1 week.  Follow-up with general surgery and nutrition in the upcoming week.  Orders Placed This Encounter  Procedures  . DG Humerus Right    Standing Status:   Future    Number of Occurrences:   1    Standing Expiration Date:   01/01/2019    Order Specific Question:   Reason for Exam (SYMPTOM  OR DIAGNOSIS REQUIRED)    Answer:   R humerus pain; fall    Order Specific  Question:   Is the patient pregnant?    Answer:   No    Order Specific Question:   Preferred imaging location?    Answer:   External  . DG HIP UNILAT W OR W/O PELVIS 2-3 VIEWS RIGHT    Standing Status:   Future    Number of Occurrences:   1    Standing Expiration Date:   01/01/2019    Order Specific Question:   Reason for Exam (SYMPTOM  OR DIAGNOSIS REQUIRED)    Answer:   R hip pain; fall    Order Specific Question:   Is the patient pregnant?    Answer:   No    Order Specific Question:   Preferred imaging location?    Answer:   External  . Comprehensive metabolic panel  .  POCT urinalysis dipstick  . POCT CBC  . POCT glucose (manual entry)  . EKG 12-Lead   No orders of the defined types were placed in this encounter.   No follow-ups on file.   Eland Lamantia Elayne Guerin, M.D. Primary Care at [facility] [address] [place], [place]  [address] [phone] phone [phone] fax

## 2018-01-01 NOTE — Patient Instructions (Addendum)
  Your x-rays today are negative for any fractures or broken bones.  He had bruised her arm and hip.  Recommend regular stretching and walking throughout the day. Each time he stand up, make sure to stand up slowly before you initiate walking.   IF you received an x-ray today, you will receive an invoice from [facility]. Please contact [facility] at [phone] with questions or concerns regarding your invoice.   IF you received labwork today, you will receive an invoice from [facility]. Please contact [facility] at [phone] with questions or concerns regarding your invoice.   Our billing staff will not be able to assist you with questions regarding bills from these companies.  You will be contacted with the lab results as soon as they are available. The fastest way to get your results is to activate your My Chart account. Instructions are located on the last page of this paperwork. If you have not heard from [place] regarding the results in 2 weeks, please contact this office.

## 2018-01-02 LAB — COMPREHENSIVE METABOLIC PANEL
ALK PHOS: 62 IU/L (ref 39–117)
ALT: 19 IU/L (ref 0–32)
AST: 20 IU/L (ref 0–40)
Albumin/Globulin Ratio: 1.3 (ref 1.2–2.2)
Albumin: 3.9 g/dL (ref 3.5–5.5)
BUN/Creatinine Ratio: 15 (ref 9–23)
BUN: 10 mg/dL (ref 6–20)
Bilirubin Total: <0.2 mg/dL (ref 0.0–1.2)
CALCIUM: 8.7 mg/dL (ref 8.7–10.2)
CO2: 22 mmol/L (ref 20–29)
CREATININE: 0.68 mg/dL (ref 0.57–1.00)
Chloride: 102 mmol/L (ref 96–106)
GFR calc Af Amer: 127 mL/min/{1.73_m2} (ref >59)
GFR, EST NON AFRICAN AMERICAN: 111 mL/min/{1.73_m2} (ref >59)
GLUCOSE: 91 mg/dL (ref 65–99)
Globulin, Total: 3 g/dL (ref 1.5–4.5)
Potassium: 4.9 mmol/L (ref 3.5–5.2)
Sodium: 136 mmol/L (ref 134–144)
Total Protein: 6.9 g/dL (ref 6.0–8.5)

## 2018-01-09 NOTE — Progress Notes (Signed)
PCP: Wardell Honour, MD  Clinic Note: Chief Complaint  Patient presents with  . Follow-up    Coronary calcium score and upper extremity Dopplers.  No new complaints.  . Chest Pain    No further pain    HPI: [name] is a 42 y.o. female with a PMH below who is seen today for follow-up evaluation of chest pain and palpitations at the request of Wardell Honour, MD. -She has a relatively complicated medical history involving history -mostly related to complex bariatric surgery history --  - gastric sleeve back in 2017 followed by Roux-en-Y revision in 2018 followed by laparoscopic revision of the jejunostomy 2019 with gastric bypass finally in January 2019 involving placement of a gastrostomy feeding tube and right upper extremity PICC line. ==> She was gradually building up her energy after her surgery.  Noted right shoulder pain.  January 28-to 30, 2019 -laparoscopic revision of gastrojejunostomy and partial gastrectomy with placement of feeding gastrostomy tube d jejunostomy  [name] seen in initial consultation on November 04, 2017.  She described central-right chest pain radiating to the right arm and shoulder that had been occurring off and on for several months.  Symptoms not necessarily associated with activity.  More often than not symptoms lasted all day and worse with sitting up.  Lying down and being completely still makes the symptoms abate. Venous Dopplers were checked in order to evaluate for DVT in the setting of a right brachial PICC line. She was evaluated with a coronary calcium score and CT angiogram.  Recent Hospitalizations:   ER visit on November 19, 2017 with issues revolving gastric tube  Studies Personally Reviewed - (if available, images/films reviewed: From Epic Chart or Care Everywhere)  Upper extremity Dopplers: No DVT or clot  CORONARY CALCIUM SCORE AND CT ANGIOGRAM November 25, 2017: Coronary calcium score 0.  Normal coronary arteries and  a right dominant system.  Interval History: [name] presents here today overall feeling quite well.  She was happy to hear the results of her tests.  She has not had the arm pain since I last saw her.  She did have her PICC line removed, and says that since that time, her symptoms have abated.  She is back more active.  The arm discomfort in the right arm numbness and weakness has also resolved. No real exertional dyspnea or chest tightness/pressure.  No PND, orthopnea or edema.  No rapid irregular heartbeats palpitations.  No syncope/near syncope or TIA/amaurosis fugax.  Still gradually recovering from her surgery, but doing better now that she had been. No claudication.  ROS: A comprehensive was performed. Review of Systems  Constitutional: Negative for malaise/fatigue (Gradually building her energy back.  Has had nutrition and anemia issues).  HENT: Negative for congestion and nosebleeds.   Respiratory: Negative for cough, shortness of breath and wheezing.   Cardiovascular: Negative for leg swelling.  Gastrointestinal: Negative for blood in stool and melena.  Genitourinary: Negative for hematuria.  Musculoskeletal: Positive for joint pain (Right shoulder -improving gradually).  Psychiatric/Behavioral: The patient is not nervous/anxious and does not have insomnia.   All other systems reviewed and are negative.   I have reviewed and (if needed) personally updated the patient's problem list, medications, allergies, past medical and surgical history, social and family history.   Past Medical History:  Diagnosis Date  . Anal fissure - posterior 10/16/2014    OCC  ISSUES  . Anxiety    doesn't take anything  .  Asthma    has Albuterol inhaler as needed  . Boil    on pubic area started septra 03-01-17 draining blood and pus  . Bronchitis   . Chronic headache disorder 07/22/2016  . Clostridium difficile infection 04/20/2012  . Colitis   . Dehydration   . Depression   . Family history  of adverse reaction to anesthesia    pt mom gets sick  . GERD (gastroesophageal reflux disease)    takes Pantoprazole daily  . Headache   . Hiatal hernia    neuropathy - mild in arms and legs  . History of blood transfusion    last transfusion was 04/04/2016=Benadryl was given d/t itching. States she always itches with transfusion.   Marland Kitchen History of bronchitis    > 2 yrs ago  . History of colon polyps    benign  . History of migraine    last one 05/01/16  . History of stomach ulcers   . History of urinary tract infection LAST 2 WEEKS AGO  . IDA (iron deficiency anemia)   . Internal and external bleeding hemorrhoids 06/11/2014  . Joint pain   . Joint swelling   . Left sided chronic colitis - segmental 06/11/2014  . Migraine   . Motion sickness   . Nausea    takes Zofran as needed  . Nausea and vomiting    for 1 year  . Obesity   . Oligouria   . Osteoarthritis    lower back, knees, wrists - no meds  . Pneumonia 1997  . Postoperative nausea and vomiting 01/21/2016   wants scopolamine patch  . SVD (spontaneous vaginal delivery)    x 4  . Tingling    BOTH LOWER EXTREMETIES ALL THE TIME DUE TO RAPID WEIGHT LOSS  . TOBACCO USER 10/02/2009   Qualifier: Diagnosis of  By: Dimas Millin MD, Ellard Artis    . Transfusion history    last transfusion 6'16   . UC (ulcerative colitis) ([place])    supposed to be taking Lialda and Bentyl but has been off since gastric sleeve  . Ulcerative colitis ([place])   . Vertigo    doesn't take any meds    Past Surgical History:  Procedure Laterality Date  . ABDOMINAL HYSTERECTOMY     PARTIAL  . COLONOSCOPY  2007   for rectal bleeding; [facility]  . COLONOSCOPY N/A 06/11/2014   Procedure: COLONOSCOPY;  Surgeon: Gatha Mayer, MD;  Location: [facility];  Service: Endoscopy;  Laterality: N/A;  . COLONOSCOPY WITH PROPOFOL N/A 03/02/2017   Procedure: COLONOSCOPY WITH PROPOFOL;  Surgeon: Alphonsa Overall, MD;  Location: [facility];  Service: General;  Laterality: N/A;  .  DILATION AND CURETTAGE OF UTERUS    . ESOPHAGOGASTRODUODENOSCOPY (EGD) WITH PROPOFOL N/A 04/06/2016   Procedure: ESOPHAGOGASTRODUODENOSCOPY (EGD) WITH PROPOFOL;  Surgeon: Alphonsa Overall, MD;  Location: [facility];  Service: General;  Laterality: N/A;  . ESOPHAGOGASTRODUODENOSCOPY (EGD) WITH PROPOFOL N/A 03/02/2017   Procedure: ESOPHAGOGASTRODUODENOSCOPY (EGD) WITH PROPOFOL;  Surgeon: Alphonsa Overall, MD;  Location: [facility];  Service: General;  Laterality: N/A;  . ESOPHAGOGASTRODUODENOSCOPY (EGD) WITH PROPOFOL N/A 05/20/2017   Procedure: ESOPHAGOGASTRODUODENOSCOPY (EGD) WITH PROPOFOL ERAS PATHWAY;  Surgeon: Alphonsa Overall, MD;  Location: [facility];  Service: General;  Laterality: N/A;  . ESOPHAGOGASTRODUODENOSCOPY (EGD) WITH PROPOFOL N/A 10/07/2017   Procedure: ESOPHAGOGASTRODUODENOSCOPY (EGD) WITH PROPOFOL;  Surgeon: Alphonsa Overall, MD;  Location: [facility];  Service: General;  Laterality: N/A;  . EXCISION OF SKIN TAG  06/08/2017   Procedure: EXCISION OF  VULVAR SKIN TAGS X2;  Surgeon: [name], MD;  Location: [facility];  Service: Gynecology;;  . FOOT SURGERY Bilateral    x 2  . GASTRIC ROUX-EN-Y N/A 12/14/2016   Procedure: LAPAROSCOPIC REVISION SLEEVE GASTRECTOMY TO  ROUX-Y-GASTRIC BY-PASS, UPPER ENDO;  Surgeon: Excell Seltzer, MD;  Location: [facility];  Service: General;  Laterality: N/A;  . GASTROJEJUNOSTOMY N/A 05/11/2016   Procedure: LAPAROSCOPIC PLACEMENT  OF FEEDING  JEJUNOSTOMY TUBE;  Surgeon: Excell Seltzer, MD;  Location: [facility];  Service: General;  Laterality: N/A;  . HEMORRHOIDECTOMY WITH HEMORRHOID BANDING    . IR FLUORO GUIDE CV LINE RIGHT  04/06/2017  . IR GENERIC HISTORICAL  05/19/2016   IR CM INJ ANY COLONIC TUBE W/FLUORO 05/19/2016 Aletta Edouard, MD [facility]  . IR PATIENT EVAL TECH 0-60 MINS  11/30/2017  . IR US GUIDE VASC ACCESS RIGHT  04/06/2017  . j tube removed march 2018    . KNEE ARTHROSCOPY Left 09/06/2014  . LAPAROSCOPIC GASTRIC SLEEVE RESECTION N/A 01/14/2016    Procedure: LAPAROSCOPIC GASTRIC SLEEVE RESECTION;  Surgeon: Excell Seltzer, MD;  [facility]: [facility];  Service: General;  Laterality: N/A;  . LAPAROSCOPIC REVISION OF GASTROJEJUNOSTOMY N/A 10/25/2017   Procedure: LAPAROSCOPIC REVISION OF GASTROJEJUNOSTOMY AND PARTIAL GASTRECTOMY, WITH PLACEMENT OF FEEDING GASTROSTOMY TUBE;  Surgeon: Excell Seltzer, MD;  Location: [facility];  Service: General;  Laterality: N/A;  . LAPAROSCOPIC TUBAL LIGATION  10/16/2011   Procedure: LAPAROSCOPIC TUBAL LIGATION;  Surgeon: Alwyn Pea, MD;  Location: [facility];  Service: Gynecology;  Laterality: Bilateral;  . NOVASURE ABLATION  09/28/2010   mild persistent vaginal bleeding  . right knee arthroscopy     05/07/2016  . TUBAL LIGATION    . VAGINAL HYSTERECTOMY Bilateral 06/08/2017   Procedure: HYSTERECTOMY VAGINAL W/ BILATERAL SALPINGECTOMY;  Surgeon: [name], MD;  Location: [facility];  Service: Gynecology;  Laterality: Bilateral;  . wisdom teeth extracted      Current Meds  Medication Sig  . acetaminophen (TYLENOL) 500 MG tablet Take 1,000 mg by mouth daily as needed (PAIN).  Marland Kitchen albuterol (PROVENTIL HFA;VENTOLIN HFA) 108 (90 Base) MCG/ACT inhaler Inhale 2 puffs into the lungs every 6 (six) hours as needed for wheezing or shortness of breath. For shortness of breath (Patient taking differently: Inhale 2 puffs into the lungs every 6 (six) hours as needed for wheezing or shortness of breath. )  . CALCIUM CITRATE PO Take 10 mLs by mouth daily.  . cyanocobalamin (,VITAMIN B-12,) 1000 MCG/ML injection Inject 1,000 mcg into the muscle every 30 (thirty) days.  Marland Kitchen FLUoxetine (PROZAC) 20 MG tablet Take 1 tablet (20 mg total) by mouth daily.  Marland Kitchen linaclotide (LINZESS) 290 MCG CAPS capsule Take 290 mcg by mouth daily before breakfast.  . Multiple Vitamins-Minerals (MULTIVITAMIN ADULT) CHEW Chew 1 tablet by mouth 2 (two) times daily.  . ondansetron (ZOFRAN-ODT) 8 MG disintegrating tablet Take 1 tablet (8 mg total) by mouth every 8  (eight) hours as needed for nausea.  . pantoprazole (PROTONIX) 40 MG tablet Take 1 tablet (40 mg total) by mouth 2 (two) times daily before a meal.  . thiamine (VITAMIN B-1) 100 MG tablet Take 100 mg by mouth daily.   . Vitamin D, Ergocalciferol, (DRISDOL) 50000 units CAPS capsule Take 1 capsule (50,000 Units total) by mouth every 7 (seven) days.   Current Facility-Administered Medications for the 01/10/18 encounter (Office Visit) with Leonie Man, MD  Medication  . cyanocobalamin ((VITAMIN B-12)) injection 1,000 mcg  . cyanocobalamin ((  VITAMIN B-12)) injection 1,000 mcg    Allergies  Allergen Reactions  . Coconut Oil Anaphylaxis and Itching  . Morphine And Related Anaphylaxis and Hives    Pt states that she has tolerated Norco and Dilaudid  . Oxycodone Anaphylaxis  . Ciprofloxacin Itching and Rash  . Doxycycline Nausea And Vomiting  . Adhesive [Tape] Rash    And paper tape causes a rash if wearing for a prolong period of time  . Penicillins Rash    Has patient had a PCN reaction causing immediate rash, facial/tongue/throat swelling, SOB or lightheadedness with hypotension: Yes Has patient had a PCN reaction causing severe rash involving mucus membranes or skin necrosis: No Has patient had a PCN reaction that required hospitalization No Has patient had a PCN reaction occurring within the last 10 years: No If all of the above answers are "NO", then may proceed with Cephalosporin use.    Social History   Tobacco Use  . Smoking status: Former Smoker    Packs/day: 0.50    Years: 20.00    Pack years: 10.00    Types: Cigarettes    Last attempt to quit: 03/17/2014    Years since quitting: 3.8  . Smokeless tobacco: Never Used  Substance Use Topics  . Alcohol use: No    Alcohol/week: 0.0 oz  . Drug use: No    Social History   Social History Narrative   Marital status:  Single; not dating seriously.      Children:  3 sons (65, 26, 39) whose father is deceased, 1 daughter  (deceased); no grandchildren      Lives:  With 2 sons      Employment:  Long term disability in 2018 due to PICC line for iv fluids; Corporate treasurer at [employer]      Tobacco: quit June 2015      Alcohol:  None      Drugs:  None      Exercise:  Leg lifts for knee strengthening; walking   1 caffeine beverage daily      Sexual activity:  Active; total partners = up there.  No STDs in past.        Right-handed    family history includes Adrenal disorder in her sister; Asthma in her brother, daughter, mother, sister, son, and son; Cancer in her paternal aunt and sister; Cancer (age of onset: 70) in her daughter; Diabetes in her father and mother; Hypertension in her father and mother; Sarcoidosis in her mother; Tics in her son.  Wt Readings from Last 3 Encounters:  01/10/18 171 lb 12.8 oz (77.9 kg)  01/01/18 169 lb (76.7 kg)  12/22/17 161 lb (73 kg)    PHYSICAL EXAM BP 111/71   Pulse 76   Ht 5' 7"  (1.702 m)   Wt 171 lb 12.8 oz (77.9 kg)   LMP 06/02/2017 Comment: hysterectomy 06/08/2017  SpO2 100%   BMI 26.91 kg/m  Physical Exam  Constitutional: She is oriented to person, place, and time. She appears well-developed and well-nourished. No distress.  Much healthy-appearing today.  Well-groomed.  HENT:  Head: Normocephalic and atraumatic.  Eyes: No scleral icterus.  Cardiovascular: Normal rate, regular rhythm, normal heart sounds and intact distal pulses. Exam reveals no gallop.  No murmur heard. Abdominal:  Somewhat hyperactive bowel sounds.  Gastrostomy tube in place.  Diffusely tender postop.  Neurological: She is alert and oriented to person, place, and time.  Skin: Skin is dry.  Psychiatric: She has a normal  mood and affect. Her behavior is normal. Judgment and thought content normal.  Nursing note and vitals reviewed.    Adult ECG Report Not checked.  Other studies Reviewed: Additional studies/ records that were reviewed today include:  Recent Labs:   Lab Results   Component Value Date   CREATININE 0.68 01/01/2018   BUN 10 01/01/2018   NA 136 01/01/2018   K 4.9 01/01/2018   CL 102 01/01/2018   CO2 22 01/01/2018   Lab Results  Component Value Date   CHOL 168 10/30/2017   HDL 60 10/30/2017   LDLCALC 88 10/30/2017   TRIG 98 10/30/2017   CHOLHDL 2.8 10/30/2017   Lab Results  Component Value Date   WBC 5.5 01/01/2018   HGB 9.2 (A) 01/01/2018   HCT 29.3 (A) 01/01/2018   MCV 75.3 (A) 01/01/2018   PLT 418 (H) 12/15/2017    ASSESSMENT / PLAN: Problem List Items Addressed This Visit    Rapid or irregular heartbeat    She has not really had any further episodes now.  Again not interested in wearing a monitor.  If these episodes recur, we talked about using the smart phone application [employer] by [employer] to capture episodes.      Chest pain at rest - Primary    Relatively atypical chest discomfort which is probably musculoskeletal based on her PICC line in exercise levels. Normal coronary calcium scoring CT angiogram argues against cardiac etiology. No evidence of DVT associated with PICC line         Current medicines are reviewed at l ength with the patient today. (+/- concerns) none  The following changes have been made:None Patient Instructions  Medication  No changes  Will take another look at doppler and will contact you once reviewed.   Your physician recommends that you schedule a follow-up appointment on an as needed basis   Studies Ordered:   No orders of the defined types were placed in this encounter.     Glenetta Hew, M.D., M.S. Interventional Cardiologist   Pager # [phone] Phone # [phone] [address] [place], [place] [address]   Thank you for choosing [facility] at [facility]!!

## 2018-01-10 ENCOUNTER — Ambulatory Visit (INDEPENDENT_AMBULATORY_CARE_PROVIDER_SITE_OTHER): Payer: 59 | Admitting: Cardiology

## 2018-01-10 ENCOUNTER — Encounter: Payer: Self-pay | Admitting: Cardiology

## 2018-01-10 VITALS — BP 111/71 | HR 76 | Ht 67.0 in | Wt 171.8 lb

## 2018-01-10 DIAGNOSIS — R079 Chest pain, unspecified: Secondary | ICD-10-CM

## 2018-01-10 DIAGNOSIS — R000 Tachycardia, unspecified: Secondary | ICD-10-CM | POA: Diagnosis not present

## 2018-01-10 NOTE — Patient Instructions (Addendum)
Medication  No changes  Will take another look at doppler and will contact you once reviewed.   Your physician recommends that you schedule a follow-up appointment on an as needed basis

## 2018-01-11 ENCOUNTER — Encounter: Payer: Self-pay | Admitting: Cardiology

## 2018-01-11 DIAGNOSIS — Z9884 Bariatric surgery status: Secondary | ICD-10-CM | POA: Diagnosis not present

## 2018-01-11 DIAGNOSIS — K289 Gastrojejunal ulcer, unspecified as acute or chronic, without hemorrhage or perforation: Secondary | ICD-10-CM | POA: Diagnosis not present

## 2018-01-11 DIAGNOSIS — K9423 Gastrostomy malfunction: Secondary | ICD-10-CM | POA: Diagnosis not present

## 2018-01-11 NOTE — Assessment & Plan Note (Signed)
Relatively atypical chest discomfort which is probably musculoskeletal based on her PICC line in exercise levels. Normal coronary calcium scoring CT angiogram argues against cardiac etiology. No evidence of DVT associated with PICC line

## 2018-01-11 NOTE — Assessment & Plan Note (Signed)
She has not really had any further episodes now.  Again not interested in wearing a monitor.  If these episodes recur, we talked about using the smart phone application [employer] by [employer] to capture episodes.

## 2018-01-13 DIAGNOSIS — M542 Cervicalgia: Secondary | ICD-10-CM | POA: Diagnosis not present

## 2018-01-13 DIAGNOSIS — M5416 Radiculopathy, lumbar region: Secondary | ICD-10-CM | POA: Diagnosis not present

## 2018-01-13 DIAGNOSIS — K289 Gastrojejunal ulcer, unspecified as acute or chronic, without hemorrhage or perforation: Secondary | ICD-10-CM | POA: Diagnosis not present

## 2018-01-13 DIAGNOSIS — Z9884 Bariatric surgery status: Secondary | ICD-10-CM | POA: Diagnosis not present

## 2018-01-13 DIAGNOSIS — M545 Low back pain: Secondary | ICD-10-CM | POA: Diagnosis not present

## 2018-01-13 DIAGNOSIS — K9423 Gastrostomy malfunction: Secondary | ICD-10-CM | POA: Diagnosis not present

## 2018-01-19 DIAGNOSIS — K289 Gastrojejunal ulcer, unspecified as acute or chronic, without hemorrhage or perforation: Secondary | ICD-10-CM | POA: Diagnosis not present

## 2018-01-19 DIAGNOSIS — K9423 Gastrostomy malfunction: Secondary | ICD-10-CM | POA: Diagnosis not present

## 2018-01-19 DIAGNOSIS — Z9884 Bariatric surgery status: Secondary | ICD-10-CM | POA: Diagnosis not present

## 2018-01-20 ENCOUNTER — Telehealth: Payer: Self-pay | Admitting: Skilled Nursing Facility1

## 2018-01-20 ENCOUNTER — Telehealth: Payer: Self-pay

## 2018-01-20 DIAGNOSIS — M545 Low back pain: Secondary | ICD-10-CM | POA: Diagnosis not present

## 2018-01-20 NOTE — Telephone Encounter (Signed)
Copied from [place] [id number]. Topic: Inquiry >> Jan 20, 2018  1:02 PM Oliver Pila B wrote: Reason for CRM: pt called to ask to have a lab order created to check her thyroid levels b/c she aware that she may have calcium deposits around her thyroid

## 2018-01-20 NOTE — Telephone Encounter (Signed)
[name],  Your question did not finish but I am assuming you were asking if I had any availability to see you. If that is true I definitely do. Just call the ladies in the office and they will set you up with an appointment. See you then :)   -----Original Message----- From: [name] <[email or web address]>  Sent: Wednesday, January 19, 2018 11:13 AM To: [name] <[email or web address]> Subject: [External Email]Appointment  *Caution - External email - see footer for warnings*  Wanted to know if you had anytime on your WARNING: This email originated outside of [facility]. Even if this looks like a [employer], it is not. Do not provide your username, password, or any other personal information in response to this or any other email. [name] will never ask you for your username or password via email. DO NOT CLICK links or attachments unless you are positive the content is safe. If in doubt about the safety of this message, select the [employer]ishing button, which forwards to IT Security.

## 2018-01-24 DIAGNOSIS — K922 Gastrointestinal hemorrhage, unspecified: Secondary | ICD-10-CM | POA: Diagnosis not present

## 2018-01-24 DIAGNOSIS — K9423 Gastrostomy malfunction: Secondary | ICD-10-CM | POA: Diagnosis not present

## 2018-01-24 DIAGNOSIS — R112 Nausea with vomiting, unspecified: Secondary | ICD-10-CM | POA: Diagnosis not present

## 2018-01-24 DIAGNOSIS — R269 Unspecified abnormalities of gait and mobility: Secondary | ICD-10-CM | POA: Diagnosis not present

## 2018-01-24 DIAGNOSIS — Z9884 Bariatric surgery status: Secondary | ICD-10-CM | POA: Diagnosis not present

## 2018-01-24 NOTE — Telephone Encounter (Signed)
mychart message sent to pt about calling to make an appt

## 2018-01-25 DIAGNOSIS — K9423 Gastrostomy malfunction: Secondary | ICD-10-CM | POA: Diagnosis not present

## 2018-01-25 DIAGNOSIS — R112 Nausea with vomiting, unspecified: Secondary | ICD-10-CM | POA: Diagnosis not present

## 2018-01-25 DIAGNOSIS — K922 Gastrointestinal hemorrhage, unspecified: Secondary | ICD-10-CM | POA: Diagnosis not present

## 2018-01-26 ENCOUNTER — Encounter: Payer: Self-pay | Admitting: Family Medicine

## 2018-01-26 ENCOUNTER — Ambulatory Visit (INDEPENDENT_AMBULATORY_CARE_PROVIDER_SITE_OTHER): Payer: 59 | Admitting: Family Medicine

## 2018-01-26 ENCOUNTER — Other Ambulatory Visit: Payer: Self-pay

## 2018-01-26 ENCOUNTER — Ambulatory Visit
Admission: RE | Admit: 2018-01-26 | Discharge: 2018-01-26 | Disposition: A | Discharge status: Home / Self Care | Payer: 59 | Source: Ambulatory Visit | Attending: Family Medicine | Admitting: Family Medicine

## 2018-01-26 VITALS — BP 110/68 | HR 112 | Temp 98.0°F | Resp 16 | Ht 67.0 in | Wt 177.0 lb

## 2018-01-26 DIAGNOSIS — G43A1 Cyclical vomiting, intractable: Secondary | ICD-10-CM | POA: Diagnosis not present

## 2018-01-26 DIAGNOSIS — E079 Disorder of thyroid, unspecified: Secondary | ICD-10-CM

## 2018-01-26 DIAGNOSIS — F419 Anxiety disorder, unspecified: Secondary | ICD-10-CM

## 2018-01-26 DIAGNOSIS — E860 Dehydration: Secondary | ICD-10-CM | POA: Diagnosis not present

## 2018-01-26 DIAGNOSIS — M5412 Radiculopathy, cervical region: Secondary | ICD-10-CM | POA: Diagnosis not present

## 2018-01-26 DIAGNOSIS — G894 Chronic pain syndrome: Secondary | ICD-10-CM | POA: Diagnosis not present

## 2018-01-26 DIAGNOSIS — D500 Iron deficiency anemia secondary to blood loss (chronic): Secondary | ICD-10-CM

## 2018-01-26 DIAGNOSIS — F329 Major depressive disorder, single episode, unspecified: Secondary | ICD-10-CM | POA: Diagnosis not present

## 2018-01-26 DIAGNOSIS — F32A Depression, unspecified: Secondary | ICD-10-CM

## 2018-01-26 DIAGNOSIS — E46 Unspecified protein-calorie malnutrition: Secondary | ICD-10-CM | POA: Diagnosis not present

## 2018-01-26 DIAGNOSIS — M5417 Radiculopathy, lumbosacral region: Secondary | ICD-10-CM | POA: Diagnosis not present

## 2018-01-26 DIAGNOSIS — E538 Deficiency of other specified B group vitamins: Secondary | ICD-10-CM | POA: Diagnosis not present

## 2018-01-26 DIAGNOSIS — R1115 Cyclical vomiting syndrome unrelated to migraine: Secondary | ICD-10-CM

## 2018-01-26 DIAGNOSIS — E041 Nontoxic single thyroid nodule: Secondary | ICD-10-CM | POA: Diagnosis not present

## 2018-01-26 NOTE — Patient Instructions (Addendum)
   Delia Chimes, MD IRMA Pamella Pert, MD [facility], PA-C.    IF you received an x-ray today, you will receive an invoice from [facility]. Please contact [facility] at [phone] with questions or concerns regarding your invoice.   IF you received labwork today, you will receive an invoice from [facility]. Please contact [facility] at [phone] with questions or concerns regarding your invoice.   Our billing staff will not be able to assist you with questions regarding bills from these companies.  You will be contacted with the lab results as soon as they are available. The fastest way to get your results is to activate your My Chart account. Instructions are located on the last page of this paperwork. If you have not heard from [place] regarding the results in 2 weeks, please contact this office.

## 2018-01-26 NOTE — Progress Notes (Signed)
Subjective:    Patient ID: MARIEANN ZIPP, female    DOB: 01-12-1976, 42 y.o.   MRN: [id number]  01/26/2018  Chronic Conditions  (follow-up )    HPI This 42 y.o. female presents for one month follow-up of nausea, vomiting, vitamin B12 deficiency.  Management changes made at last visit include the following:  Contusion right upper arm: New onset secondary to fall due to orthostatic hypotension suspected.  Tender to palpation along middle numerous region.  X-ray negative today.  Recommend supportive care with rest, stretches, avoiding heavy lifting.  Right hip contusion: New.  Secondary to fall due to orthostatic hypotension with dizziness.  Recommend rest, frequent ambulation, icing.  Dizziness: Worsening.  Onset twice in the past week due to orthostatic hypotension.  Increase water intake.  One episode of syncope associated with dizziness prior to recent continuous feedings.  Obtain labs to rule out secondary causes.  EKG obtained and normal in office today.  Benign neurological cardiac exams in office.  Syncope: Secondary to dizziness due to orthostatic hypotension.  Currently undergoing cardiac evaluation and syncopal event occurred 1 week ago.  Has upcoming follow-up with cardiology and recommend reviewing syncopal event with cardiology at that time.  Chronic nausea and vomiting: Following gastric sleeve and Roux-en-Y surgeries.  Status post recent gastric ulcer resection by general surgery.  Suffering with malnutrition and dehydration.  Recently initiated continuous feeding per general surgery.  Continues to suffer with persistent nausea and only vomits if he eats by mouth.  Malnutrition: Secondary to chronic nausea and vomiting following gastric bypass surgery.  Initiated continuous feeds last week and is gained 8 pounds in 1 week.  Follow-up with general surgery and nutrition in the upcoming week.  Anemia stable with hemoglobin of 9.8. Sugar is normal. Urine is concentrated.  Increase water intake throughout the day. Vitamin D remains low despite supplementation. How much vitamin D are you currently taking? Iron level is normal. B12 level is normal. Amylase and lipase are normal. These levels represent pancreas function.   UPDATE:  Called to request labs per orthopedist.  Received an email requesting appointment. Has calcifications around thyroid; recommended thyroid labs.  Did not recommend thyroid ulttrasound. Underwent xray of neck that revealed calcifications of thyroid;   [employer].   Last OV last week. Has gained too much weight; up ten pounds in a month.  Gaining too much weight; not happy. Seeing nutritionist tomorrow. Still vomiting; upcoming appointment with general surgeon on 02/04/18.  Vomiting formula that goes into NG tube.  R arm is still tender. S/p cardiology evaluation for syncope.    BP Readings from Last 3 Encounters:  03/30/18 110/78  03/15/18 106/71  03/03/18 111/62   Wt Readings from Last 3 Encounters:  03/30/18 185 lb 6.4 oz (84.1 kg)  03/15/18 202 lb (91.6 kg)  03/03/18 190 lb (86.2 kg)   Immunization History  Administered Date(s) Administered  . Influenza,inj,Quad PF,6+ Mos 09/02/2015, 07/21/2016, 05/19/2017  . Influenza-Unspecified 07/30/2014  . Tdap 05/18/2015    Review of Systems  Constitutional: Negative for activity change, appetite change, chills, diaphoresis, fatigue, fever and unexpected weight change.  HENT: Negative for congestion, dental problem, drooling, ear discharge, ear pain, facial swelling, hearing loss, mouth sores, nosebleeds, postnasal drip, rhinorrhea, sinus pressure, sneezing, sore throat, tinnitus, trouble swallowing and voice change.   Eyes: Negative for photophobia, pain, discharge, redness, itching and visual disturbance.  Respiratory: Negative for apnea, cough, choking, chest tightness, shortness of breath, wheezing and stridor.   Cardiovascular:  Negative for chest pain,  palpitations and leg swelling.  Gastrointestinal: Positive for nausea and vomiting. Negative for abdominal distention, abdominal pain, anal bleeding, blood in stool, constipation, diarrhea and rectal pain.  Endocrine: Negative for cold intolerance, heat intolerance, polydipsia, polyphagia and polyuria.  Genitourinary: Negative for decreased urine volume, difficulty urinating, dyspareunia, dysuria, enuresis, flank pain, frequency, genital sores, hematuria, menstrual problem, pelvic pain, urgency, vaginal bleeding, vaginal discharge and vaginal pain.  Musculoskeletal: Positive for arthralgias. Negative for back pain, gait problem, joint swelling, myalgias, neck pain and neck stiffness.  Skin: Negative for color change, pallor, rash and wound.  Allergic/Immunologic: Negative for environmental allergies, food allergies and immunocompromised state.  Neurological: Negative for dizziness, tremors, seizures, syncope, facial asymmetry, speech difficulty, weakness, light-headedness, numbness and headaches.  Hematological: Negative for adenopathy. Does not bruise/bleed easily.  Psychiatric/Behavioral: Negative for agitation, behavioral problems, confusion, decreased concentration, dysphoric mood, hallucinations, self-injury, sleep disturbance and suicidal ideas. The patient is not nervous/anxious and is not hyperactive.     Past Medical History:  Diagnosis Date  . Anal fissure - posterior 10/16/2014    OCC  ISSUES  . Anxiety    doesn't take anything  . Asthma    has Albuterol inhaler as needed  . Boil    on pubic area started septra 03-01-17 draining blood and pus  . Bronchitis   . Chronic headache disorder 07/22/2016  . Clostridium difficile infection 04/20/2012  . Colitis   . Dehydration   . Depression   . Family history of adverse reaction to anesthesia    pt mom gets sick  . GERD (gastroesophageal reflux disease)    takes Pantoprazole daily  . Headache   . Hiatal hernia    neuropathy - mild  in arms and legs  . History of blood transfusion    last transfusion was 04/04/2016=Benadryl was given d/t itching. States she always itches with transfusion.   Marland Kitchen History of bronchitis    > 2 yrs ago  . History of colon polyps    benign  . History of migraine    last one 05/01/16  . History of stomach ulcers   . History of urinary tract infection LAST 2 WEEKS AGO  . IDA (iron deficiency anemia)   . Internal and external bleeding hemorrhoids 06/11/2014  . Joint pain   . Joint swelling   . Left sided chronic colitis - segmental 06/11/2014  . Migraine   . Motion sickness   . Nausea    takes Zofran as needed  . Nausea and vomiting    for 1 year  . Obesity   . Oligouria   . Osteoarthritis    lower back, knees, wrists - no meds  . Pneumonia 1997  . Postoperative nausea and vomiting 01/21/2016   wants scopolamine patch  . SVD (spontaneous vaginal delivery)    x 4  . Tingling    BOTH LOWER EXTREMETIES ALL THE TIME DUE TO RAPID WEIGHT LOSS  . TOBACCO USER 10/02/2009   Qualifier: Diagnosis of  By: Dimas Millin MD, Ellard Artis    . Transfusion history    last transfusion 6'16   . UC (ulcerative colitis) ([place])    supposed to be taking Lialda and Bentyl but has been off since gastric sleeve  . Ulcerative colitis ([place])   . Vertigo    doesn't take any meds   Past Surgical History:  Procedure Laterality Date  . ABDOMINAL HYSTERECTOMY     PARTIAL  . COLONOSCOPY  2007   for rectal  bleeding; [facility]  . COLONOSCOPY N/A 06/11/2014   Procedure: COLONOSCOPY;  Surgeon: Gatha Mayer, MD;  Location: [facility];  Service: Endoscopy;  Laterality: N/A;  . COLONOSCOPY WITH PROPOFOL N/A 03/02/2017   Procedure: COLONOSCOPY WITH PROPOFOL;  Surgeon: Alphonsa Overall, MD;  Location: [facility];  Service: General;  Laterality: N/A;  . DILATION AND CURETTAGE OF UTERUS    . ESOPHAGOGASTRODUODENOSCOPY (EGD) WITH PROPOFOL N/A 04/06/2016   Procedure: ESOPHAGOGASTRODUODENOSCOPY (EGD) WITH PROPOFOL;  Surgeon: Alphonsa Overall,  MD;  Location: [facility];  Service: General;  Laterality: N/A;  . ESOPHAGOGASTRODUODENOSCOPY (EGD) WITH PROPOFOL N/A 03/02/2017   Procedure: ESOPHAGOGASTRODUODENOSCOPY (EGD) WITH PROPOFOL;  Surgeon: Alphonsa Overall, MD;  Location: [facility];  Service: General;  Laterality: N/A;  . ESOPHAGOGASTRODUODENOSCOPY (EGD) WITH PROPOFOL N/A 05/20/2017   Procedure: ESOPHAGOGASTRODUODENOSCOPY (EGD) WITH PROPOFOL ERAS PATHWAY;  Surgeon: Alphonsa Overall, MD;  Location: [facility];  Service: General;  Laterality: N/A;  . ESOPHAGOGASTRODUODENOSCOPY (EGD) WITH PROPOFOL N/A 10/07/2017   Procedure: ESOPHAGOGASTRODUODENOSCOPY (EGD) WITH PROPOFOL;  Surgeon: Alphonsa Overall, MD;  Location: [facility];  Service: General;  Laterality: N/A;  . EXCISION OF SKIN TAG  06/08/2017   Procedure: EXCISION OF VULVAR SKIN TAGS X2;  Surgeon: [name], MD;  Location: [facility];  Service: Gynecology;;  . FOOT SURGERY Bilateral    x 2  . GASTRIC ROUX-EN-Y N/A 12/14/2016   Procedure: LAPAROSCOPIC REVISION SLEEVE GASTRECTOMY TO  ROUX-Y-GASTRIC BY-PASS, UPPER ENDO;  Surgeon: Excell Seltzer, MD;  Location: [facility];  Service: General;  Laterality: N/A;  . GASTROJEJUNOSTOMY N/A 05/11/2016   Procedure: LAPAROSCOPIC PLACEMENT  OF FEEDING  JEJUNOSTOMY TUBE;  Surgeon: Excell Seltzer, MD;  Location: [facility];  Service: General;  Laterality: N/A;  . HEMORRHOIDECTOMY WITH HEMORRHOID BANDING    . IR FLUORO GUIDE CV LINE RIGHT  04/06/2017  . IR GENERIC HISTORICAL  05/19/2016   IR CM INJ ANY COLONIC TUBE W/FLUORO 05/19/2016 Aletta Edouard, MD [facility]  . IR PATIENT EVAL TECH 0-60 MINS  11/30/2017  . IR REPLC DUODEN/JEJUNO TUBE PERCUT W/FLUORO  03/14/2018  . IR REPLC DUODEN/JEJUNO TUBE PERCUT W/FLUORO  03/15/2018  . IR US GUIDE VASC ACCESS RIGHT  04/06/2017  . j tube removed march 2018    . KNEE ARTHROSCOPY Left 09/06/2014  . LAPAROSCOPIC GASTRIC SLEEVE RESECTION N/A 01/14/2016   Procedure: LAPAROSCOPIC GASTRIC SLEEVE RESECTION;  Surgeon:  Excell Seltzer, MD;  Location: [facility];  Service: General;  Laterality: N/A;  . LAPAROSCOPIC REVISION OF GASTROJEJUNOSTOMY N/A 10/25/2017   Procedure: LAPAROSCOPIC REVISION OF GASTROJEJUNOSTOMY AND PARTIAL GASTRECTOMY, WITH PLACEMENT OF FEEDING GASTROSTOMY TUBE;  Surgeon: Excell Seltzer, MD;  Location: [facility];  Service: General;  Laterality: N/A;  . LAPAROSCOPIC TUBAL LIGATION  10/16/2011   Procedure: LAPAROSCOPIC TUBAL LIGATION;  Surgeon: Alwyn Pea, MD;  Location: [facility];  Service: Gynecology;  Laterality: Bilateral;  . NOVASURE ABLATION  09/28/2010   mild persistent vaginal bleeding  . right knee arthroscopy     05/07/2016  . TOTAL KNEE ARTHROPLASTY Left 03/10/2018   Procedure: LEFT TOTAL KNEE ARTHROPLASTY;  Surgeon: Paralee Cancel, MD;  Location: [facility];  Service: Orthopedics;  Laterality: Left;  70 mins  . TUBAL LIGATION    . VAGINAL HYSTERECTOMY Bilateral 06/08/2017   Procedure: HYSTERECTOMY VAGINAL W/ BILATERAL SALPINGECTOMY;  Surgeon: [name], MD;  Location: [facility];  Service: Gynecology;  Laterality: Bilateral;  . wisdom teeth extracted     Allergies  Allergen Reactions  . Coconut Oil Anaphylaxis and Itching  . Morphine  And Related Anaphylaxis and Hives    Pt states that she has tolerated Norco and Dilaudid  . Oxycodone Anaphylaxis  . Ciprofloxacin Itching and Rash  . Doxycycline Nausea And Vomiting  . Adhesive [Tape] Rash    And paper tape causes a rash if wearing for a prolong period of time  . Penicillins Rash    Has patient had a PCN reaction causing immediate rash, facial/tongue/throat swelling, SOB or lightheadedness with hypotension: Yes Has patient had a PCN reaction causing severe rash involving mucus membranes or skin necrosis: No Has patient had a PCN reaction that required hospitalization No Has patient had a PCN reaction occurring within the last 10 years: No If all of the above answers are "NO", then may proceed with Cephalosporin use.   Current  Outpatient Medications on File Prior to Visit  Medication Sig Dispense Refill  . albuterol (PROVENTIL HFA;VENTOLIN HFA) 108 (90 Base) MCG/ACT inhaler Inhale 2 puffs into the lungs every 6 (six) hours as needed for wheezing or shortness of breath. For shortness of breath (Patient taking differently: Inhale 2 puffs into the lungs every 6 (six) hours as needed for wheezing or shortness of breath. ) 54 g 0  . CALCIUM CITRATE PO 10 mLs by Feeding Tube route every other day. In the afternoon.    . ondansetron (ZOFRAN-ODT) 8 MG disintegrating tablet Take 1 tablet (8 mg total) by mouth every 8 (eight) hours as needed for nausea. 20 tablet 5  . pantoprazole (PROTONIX) 40 MG tablet Take 1 tablet (40 mg total) by mouth 2 (two) times daily before a meal. 180 tablet 1  . thiamine (VITAMIN B-1) 100 MG tablet Take 100 mg by mouth daily at 12 noon.      Current Facility-Administered Medications on File Prior to Visit  Medication Dose Route Frequency Provider Last Rate Last Dose  . cyanocobalamin ((VITAMIN B-12)) injection 1,000 mcg  1,000 mcg Intramuscular Q30 days Wardell Honour, MD   1,000 mcg at 06/22/17 1537   Social History   Socioeconomic History  . Marital status: Single    Spouse name: Not on file  . Number of children: 3  . Years of education: Some college  . Highest education level: Not on file  Occupational History  . Occupation: Production designer, theatre/television/film    Comment: Currently out on disability d/t surg  Social Needs  . Financial resource strain: Not on file  . Food insecurity:    Worry: Not on file    Inability: Not on file  . Transportation needs:    Medical: Not on file    Non-medical: Not on file  Tobacco Use  . Smoking status: Former Smoker    Packs/day: 0.50    Years: 20.00    Pack years: 10.00    Types: Cigarettes    Last attempt to quit: 03/17/2014    Years since quitting: 4.0  . Smokeless tobacco: Never Used  Substance and Sexual Activity  . Alcohol use: No    Alcohol/week: 0.0  oz  . Drug use: No  . Sexual activity: Not Currently    Birth control/protection: Surgical, Abstinence  Lifestyle  . Physical activity:    Days per week: Not on file    Minutes per session: Not on file  . Stress: Not on file  Relationships  . Social connections:    Talks on phone: Not on file    Gets together: Not on file    Attends religious service: Not on file  Active member of club or organization: Not on file    Attends meetings of clubs or organizations: Not on file    Relationship status: Not on file  . Intimate partner violence:    Fear of current or ex partner: Not on file    Emotionally abused: Not on file    Physically abused: Not on file    Forced sexual activity: Not on file  Other Topics Concern  . Not on file  Social History Narrative   Marital status:  Single; not dating seriously.      Children:  3 sons (74, 66, 45) whose father is deceased, 1 daughter (deceased); no grandchildren      Lives:  With 2 sons      Employment:  Long term disability in 2018 due to PICC line for iv fluids; Corporate treasurer at [employer]      Tobacco: quit June 2015      Alcohol:  None      Drugs:  None      Exercise:  Leg lifts for knee strengthening; walking   1 caffeine beverage daily      Sexual activity:  Active; total partners = up there.  No STDs in past.        Right-handed   Family History  Problem Relation Age of Onset  . Hypertension Mother   . Diabetes Mother   . Sarcoidosis Mother        lungs and skin  . Asthma Mother   . Hypertension Father   . Diabetes Father   . Asthma Son   . Tics Son   . Asthma Sister   . Cancer Sister        possible pancreatic cancer  . Adrenal disorder Sister        Tumor   . Asthma Brother   . Asthma Daughter        died age 541.5  . Cancer Daughter 4       brain; died age 541.5  . Asthma Son   . Cancer Paternal Aunt        brain, colon, lung and esophagus; unsure of primary        Objective:    BP 110/68   Pulse  (!) 112   Temp 98 F (36.7 C) (Oral)   Resp 16   Ht 5' 7"  (1.702 m)   Wt 177 lb (80.3 kg)   LMP 06/02/2017 Comment: hysterectomy 06/08/2017  SpO2 96%   BMI 27.72 kg/m  Physical Exam  Constitutional: She is oriented to person, place, and time. She appears well-developed and well-nourished. No distress.  HENT:  Head: Normocephalic and atraumatic.  Right Ear: External ear normal.  Left Ear: External ear normal.  Nose: Nose normal.  Mouth/Throat: Oropharynx is clear and moist.  Eyes: Pupils are equal, round, and reactive to light. Conjunctivae and EOM are normal.  Neck: Normal range of motion. Neck supple. Carotid bruit is not present. No thyromegaly present.  Cardiovascular: Normal rate, regular rhythm, normal heart sounds and intact distal pulses. Exam reveals no gallop and no friction rub.  No murmur heard. Pulmonary/Chest: Effort normal and breath sounds normal. She has no wheezes. She has no rales.  Abdominal: Soft. Bowel sounds are normal. She exhibits no distension and no mass. There is no tenderness. There is no rebound and no guarding.  Lymphadenopathy:    She has no cervical adenopathy.  Neurological: She is alert and oriented to person, place, and time. No  cranial nerve deficit.  Skin: Skin is warm and dry. No rash noted. She is not diaphoretic. No erythema. No pallor.  Psychiatric: She has a normal mood and affect. Her behavior is normal.   No results found. Depression screen [facility] 2/9 02/28/2018 01/26/2018 12/22/2017 12/15/2017 09/22/2017  Decreased Interest 0 2 1 3 1   Down, Depressed, Hopeless 0 2 1 3 1   PHQ - 2 Score 0 4 2 6 2   Altered sleeping - 1 - 3 1  Tired, decreased energy - 1 - 3 1  Change in appetite - 1 - 3 0  Feeling bad or failure about yourself  - 1 - 3 1  Trouble concentrating - - - 3 0  Moving slowly or fidgety/restless - - - 3 0  Suicidal thoughts - - - 0 0  PHQ-9 Score - 8 - 24 5  Difficult doing work/chores - - - - -  Some recent data might be hidden    Fall Risk  03/30/2018 02/28/2018 01/26/2018 01/01/2018 12/22/2017  Falls in the past year? Yes No No Yes No  Number falls in past yr: 2 or more - - 2 or more -  Injury with Fall? No - - Yes -  Comment - - - - -  Follow up - - - - -        Assessment & Plan:   1. Thyroid condition   2. Anxiety and depression   3. Iron deficiency anemia due to chronic blood loss   4. B12 deficiency   5. Dehydration   6. Malnutrition, calorie ([place])   7. Intractable cyclical vomiting with nausea     Thyroid calcifications: New onset.  Detected by orthopedics.  Obtain thyroid labs and thyroid ultrasound to evaluate further.    Vitamin B12 deficiency:  Stable.  Status post B12 injection in office.    Dehydration with malnutrition secondary to nausea/vomiting following bariatric surgery: followed closely by general surgery and nutrition.  Stable at this time.  Anxiety and depression: reactive due to post-operative complications from bariatric surgery. Coping well at this time.  Iron deficiency anemia: chronic; repeat labs today.  Orders Placed This Encounter  Procedures  . US THYROID    Wt 177/ no needs/ ins uhc    Standing Status:   Future    Number of Occurrences:   1    Standing Expiration Date:   03/29/2019    Order Specific Question:   Reason for Exam (SYMPTOM  OR DIAGNOSIS REQUIRED)    Answer:   thyroid calcifications on cervical spine films    Order Specific Question:   Preferred imaging location?    Answer:   [facility]  . CBC with Differential/Platelet  . Comprehensive metabolic panel  . TSH  . T4, free   No orders of the defined types were placed in this encounter.   Return in about 1 month (around 02/23/2018) for recheck.   Tobyn Osgood Elayne Guerin, M.D. Primary Care at [facility] previously [facility] [place] [address] [place], [place]  [address] [phone] phone [phone] fax

## 2018-01-27 ENCOUNTER — Encounter: Payer: 59 | Attending: Family Medicine | Admitting: Skilled Nursing Facility1

## 2018-01-27 ENCOUNTER — Encounter: Payer: Self-pay | Admitting: Skilled Nursing Facility1

## 2018-01-27 DIAGNOSIS — Z713 Dietary counseling and surveillance: Secondary | ICD-10-CM | POA: Insufficient documentation

## 2018-01-27 DIAGNOSIS — Z6830 Body mass index (BMI) 30.0-30.9, adult: Secondary | ICD-10-CM | POA: Diagnosis not present

## 2018-01-27 DIAGNOSIS — K9423 Gastrostomy malfunction: Secondary | ICD-10-CM | POA: Diagnosis not present

## 2018-01-27 DIAGNOSIS — E669 Obesity, unspecified: Secondary | ICD-10-CM | POA: Insufficient documentation

## 2018-01-27 DIAGNOSIS — Z9884 Bariatric surgery status: Secondary | ICD-10-CM | POA: Diagnosis present

## 2018-01-27 DIAGNOSIS — E46 Unspecified protein-calorie malnutrition: Secondary | ICD-10-CM

## 2018-01-27 DIAGNOSIS — K289 Gastrojejunal ulcer, unspecified as acute or chronic, without hemorrhage or perforation: Secondary | ICD-10-CM | POA: Diagnosis not present

## 2018-01-27 LAB — COMPREHENSIVE METABOLIC PANEL
ALBUMIN: 4.1 g/dL (ref 3.5–5.5)
ALT: 13 IU/L (ref 0–32)
AST: 18 IU/L (ref 0–40)
Albumin/Globulin Ratio: 1.4 (ref 1.2–2.2)
Alkaline Phosphatase: 64 IU/L (ref 39–117)
BUN / CREAT RATIO: 15 (ref 9–23)
BUN: 10 mg/dL (ref 6–20)
Bilirubin Total: 0.2 mg/dL (ref 0.0–1.2)
CALCIUM: 9.1 mg/dL (ref 8.7–10.2)
CO2: 24 mmol/L (ref 20–29)
CREATININE: 0.67 mg/dL (ref 0.57–1.00)
Chloride: 103 mmol/L (ref 96–106)
GFR, EST AFRICAN AMERICAN: 128 mL/min/{1.73_m2} (ref >59)
GFR, EST NON AFRICAN AMERICAN: 111 mL/min/{1.73_m2} (ref >59)
Globulin, Total: 3 g/dL (ref 1.5–4.5)
Glucose: 81 mg/dL (ref 65–99)
Potassium: 5.1 mmol/L (ref 3.5–5.2)
SODIUM: 137 mmol/L (ref 134–144)
TOTAL PROTEIN: 7.1 g/dL (ref 6.0–8.5)

## 2018-01-27 LAB — CBC WITH DIFFERENTIAL/PLATELET
BASOS: 0 %
Basophils Absolute: 0 10*3/uL (ref 0.0–0.2)
EOS (ABSOLUTE): 0.2 10*3/uL (ref 0.0–0.4)
Eos: 3 %
HEMATOCRIT: 30.1 % — AB (ref 34.0–46.6)
HEMOGLOBIN: 9 g/dL — AB (ref 11.1–15.9)
IMMATURE GRANULOCYTES: 0 %
Immature Grans (Abs): 0 10*3/uL (ref 0.0–0.1)
Lymphocytes Absolute: 3 10*3/uL (ref 0.7–3.1)
Lymphs: 43 %
MCH: 23.4 pg — ABNORMAL LOW (ref 26.6–33.0)
MCHC: 29.9 g/dL — ABNORMAL LOW (ref 31.5–35.7)
MCV: 78 fL — ABNORMAL LOW (ref 79–97)
MONOCYTES: 5 %
Monocytes Absolute: 0.3 10*3/uL (ref 0.1–0.9)
NEUTROS PCT: 49 %
Neutrophils Absolute: 3.4 10*3/uL (ref 1.4–7.0)
Platelets: 359 10*3/uL (ref 150–379)
RBC: 3.84 x10E6/uL (ref 3.77–5.28)
RDW: 17.5 % — ABNORMAL HIGH (ref 12.3–15.4)
WBC: 7 10*3/uL (ref 3.4–10.8)

## 2018-01-27 LAB — TSH: TSH: 1.14 u[IU]/mL (ref 0.450–4.500)

## 2018-01-27 LAB — T4, FREE: FREE T4: 0.87 ng/dL (ref 0.82–1.77)

## 2018-01-27 NOTE — Progress Notes (Signed)
  Post Op appt: Pt states she Does not like to leave the house due to self image/depression. 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 Currently tube feed placed but attempting oral nutrition using the tube for increased water consumption.  Pt arrives with increased mobility and a fuller face. Pt arrives upset because she has gained weight. Dietitian educated the pt on her finally being hydrated and no longer being malnourished is going to increase her weight but it is not necessarily fat mass and according to her tanita it is indeed fluid and muscle mass.  Pt states she: PO intake- Chews on ice, when taking medicine with water. Pt states she Throws up sometimes with the medication or just water or any other beverage. In the last month throwing up 6 separate occasions. Pt states if she gets constipated she will have nausea and throw up. Pt states she is having a bowel movement every other day to every 2 days. Pt states she is doing 60 ml of water flushes every 2 hours since last Tuesday. Pt states she Will get injections for neck back and knees which will hopefully help her move more without pain. Pt states her Pearlie Oyster and [place] are making her get a new doctor and phycologist but states she will continue to also see whom she has been seeing.    Currently on Osmolite 1.2 6-6.5 cans a day continuous feed with an order to change to vital high protein with a little over 1400 calcoires      03/08/2017 06/02/2017 11/09/2017 01/27/2018   BMI (kg/m^2) 27.4 27.3 26.1 28.3   Fat Mass (lbs) 64 62.6 64.4 68.2   Fat Free Mass (lbs) 111 106.8 102 112.2   Total Body Water (lbs) 79 76 72.4 80.2   Goals: Do not consume anything PO Work on being healthy mentally and accepting who you are

## 2018-02-03 DIAGNOSIS — K9423 Gastrostomy malfunction: Secondary | ICD-10-CM | POA: Diagnosis not present

## 2018-02-03 DIAGNOSIS — K289 Gastrojejunal ulcer, unspecified as acute or chronic, without hemorrhage or perforation: Secondary | ICD-10-CM | POA: Diagnosis not present

## 2018-02-03 DIAGNOSIS — Z9884 Bariatric surgery status: Secondary | ICD-10-CM | POA: Diagnosis not present

## 2018-02-08 DIAGNOSIS — K9423 Gastrostomy malfunction: Secondary | ICD-10-CM | POA: Diagnosis not present

## 2018-02-08 DIAGNOSIS — R269 Unspecified abnormalities of gait and mobility: Secondary | ICD-10-CM | POA: Diagnosis not present

## 2018-02-08 DIAGNOSIS — K289 Gastrojejunal ulcer, unspecified as acute or chronic, without hemorrhage or perforation: Secondary | ICD-10-CM | POA: Diagnosis not present

## 2018-02-08 DIAGNOSIS — K922 Gastrointestinal hemorrhage, unspecified: Secondary | ICD-10-CM | POA: Diagnosis not present

## 2018-02-08 DIAGNOSIS — Z9884 Bariatric surgery status: Secondary | ICD-10-CM | POA: Diagnosis not present

## 2018-02-10 DIAGNOSIS — M1712 Unilateral primary osteoarthritis, left knee: Secondary | ICD-10-CM | POA: Insufficient documentation

## 2018-02-11 DIAGNOSIS — M1712 Unilateral primary osteoarthritis, left knee: Secondary | ICD-10-CM | POA: Diagnosis not present

## 2018-02-11 DIAGNOSIS — M25562 Pain in left knee: Secondary | ICD-10-CM | POA: Diagnosis not present

## 2018-02-12 DIAGNOSIS — M51369 Other intervertebral disc degeneration, lumbar region without mention of lumbar back pain or lower extremity pain: Secondary | ICD-10-CM | POA: Insufficient documentation

## 2018-02-12 DIAGNOSIS — M5030 Other cervical disc degeneration, unspecified cervical region: Secondary | ICD-10-CM | POA: Diagnosis not present

## 2018-02-12 DIAGNOSIS — M5136 Other intervertebral disc degeneration, lumbar region: Secondary | ICD-10-CM | POA: Diagnosis not present

## 2018-02-17 ENCOUNTER — Encounter: Payer: Self-pay | Admitting: Family Medicine

## 2018-02-17 ENCOUNTER — Telehealth: Payer: Self-pay | Admitting: *Deleted

## 2018-02-17 NOTE — Telephone Encounter (Signed)
   [facility] Pre-operative Risk Assessment    Request for surgical clearance:  1. What type of surgery is being performed? Left TKA   2. When is this surgery scheduled? TBD   3. What type of clearance is required (medical clearance vs. Pharmacy clearance to hold med vs. Both)? medical  4. Are there any medications that need to be held prior to surgery and how long?   5. Practice name and name of physician performing surgery? [facility] Dr. Alvan Dame   6. What is your office phone number [phone]    7.   What is your office fax number [phone]  8.   Anesthesia type (None, local, MAC, general) ? spinal   [name] A [name] 02/17/2018, 4:16 PM  _________________________________________________________________   (provider comments below)

## 2018-02-18 DIAGNOSIS — Z9884 Bariatric surgery status: Secondary | ICD-10-CM | POA: Diagnosis not present

## 2018-02-18 DIAGNOSIS — K289 Gastrojejunal ulcer, unspecified as acute or chronic, without hemorrhage or perforation: Secondary | ICD-10-CM | POA: Diagnosis not present

## 2018-02-18 DIAGNOSIS — K9423 Gastrostomy malfunction: Secondary | ICD-10-CM | POA: Diagnosis not present

## 2018-02-18 NOTE — Telephone Encounter (Signed)
   Primary Cardiologist: Glenetta Hew, MD  Chart reviewed as part of pre-operative protocol coverage. Given past medical history and time since last visit, based on ACC/AHA guidelines, [name] would be at acceptable risk for the planned procedure without further cardiovascular testing.  She has 0 calcium score on cardiac CT . Was seen by Dr. Ellyn Hack in April and stable.   I will route this recommendation to the requesting party via Epic fax function and remove from pre-op pool.  Please call with questions.  Cecilie Kicks, NP 02/18/2018, 3:42 PM

## 2018-02-22 DIAGNOSIS — K289 Gastrojejunal ulcer, unspecified as acute or chronic, without hemorrhage or perforation: Secondary | ICD-10-CM | POA: Diagnosis not present

## 2018-02-22 DIAGNOSIS — K922 Gastrointestinal hemorrhage, unspecified: Secondary | ICD-10-CM | POA: Diagnosis not present

## 2018-02-22 DIAGNOSIS — Z9884 Bariatric surgery status: Secondary | ICD-10-CM | POA: Diagnosis not present

## 2018-02-22 DIAGNOSIS — R269 Unspecified abnormalities of gait and mobility: Secondary | ICD-10-CM | POA: Diagnosis not present

## 2018-02-22 DIAGNOSIS — K9423 Gastrostomy malfunction: Secondary | ICD-10-CM | POA: Diagnosis not present

## 2018-02-24 DIAGNOSIS — K922 Gastrointestinal hemorrhage, unspecified: Secondary | ICD-10-CM | POA: Diagnosis not present

## 2018-02-24 DIAGNOSIS — K9423 Gastrostomy malfunction: Secondary | ICD-10-CM | POA: Diagnosis not present

## 2018-02-24 DIAGNOSIS — R112 Nausea with vomiting, unspecified: Secondary | ICD-10-CM | POA: Diagnosis not present

## 2018-02-25 DIAGNOSIS — K9423 Gastrostomy malfunction: Secondary | ICD-10-CM | POA: Diagnosis not present

## 2018-02-28 ENCOUNTER — Encounter: Payer: Self-pay | Admitting: Family Medicine

## 2018-02-28 ENCOUNTER — Ambulatory Visit (INDEPENDENT_AMBULATORY_CARE_PROVIDER_SITE_OTHER): Payer: 59 | Admitting: Family Medicine

## 2018-02-28 VITALS — BP 110/68 | HR 94 | Temp 98.0°F | Resp 16 | Ht 67.13 in | Wt 189.0 lb

## 2018-02-28 DIAGNOSIS — R300 Dysuria: Secondary | ICD-10-CM | POA: Diagnosis not present

## 2018-02-28 DIAGNOSIS — G43A1 Cyclical vomiting, intractable: Secondary | ICD-10-CM

## 2018-02-28 DIAGNOSIS — E538 Deficiency of other specified B group vitamins: Secondary | ICD-10-CM | POA: Diagnosis not present

## 2018-02-28 DIAGNOSIS — R1115 Cyclical vomiting syndrome unrelated to migraine: Secondary | ICD-10-CM

## 2018-02-28 DIAGNOSIS — M1712 Unilateral primary osteoarthritis, left knee: Secondary | ICD-10-CM

## 2018-02-28 DIAGNOSIS — M5136 Other intervertebral disc degeneration, lumbar region: Secondary | ICD-10-CM | POA: Diagnosis not present

## 2018-02-28 DIAGNOSIS — F329 Major depressive disorder, single episode, unspecified: Secondary | ICD-10-CM

## 2018-02-28 DIAGNOSIS — G894 Chronic pain syndrome: Secondary | ICD-10-CM | POA: Diagnosis not present

## 2018-02-28 DIAGNOSIS — Z01818 Encounter for other preprocedural examination: Secondary | ICD-10-CM

## 2018-02-28 DIAGNOSIS — E559 Vitamin D deficiency, unspecified: Secondary | ICD-10-CM

## 2018-02-28 DIAGNOSIS — Z9884 Bariatric surgery status: Secondary | ICD-10-CM

## 2018-02-28 DIAGNOSIS — E46 Unspecified protein-calorie malnutrition: Secondary | ICD-10-CM

## 2018-02-28 DIAGNOSIS — D508 Other iron deficiency anemias: Secondary | ICD-10-CM | POA: Diagnosis not present

## 2018-02-28 LAB — POCT URINALYSIS DIP (MANUAL ENTRY)
BILIRUBIN UA: NEGATIVE
BILIRUBIN UA: NEGATIVE mg/dL
Glucose, UA: NEGATIVE mg/dL
Leukocytes, UA: NEGATIVE
Nitrite, UA: NEGATIVE
PH UA: 6 (ref 5.0–8.0)
PROTEIN UA: NEGATIVE mg/dL
RBC UA: NEGATIVE
SPEC GRAV UA: 1.015 (ref 1.010–1.025)
Urobilinogen, UA: 0.2 E.U./dL

## 2018-02-28 MED ORDER — LINACLOTIDE 290 MCG PO CAPS: 290.0000 ug | ORAL_CAPSULE | Freq: Every day | ORAL | 1 refills | Status: DC

## 2018-02-28 NOTE — Patient Instructions (Addendum)
  Vitamin C and Zinc and B vitamins are in multivitamin. Vitamin D3 is in multivitamin but in small amounts.   IF you received an x-ray today, you will receive an invoice from [facility]. Please contact [facility] at [phone] with questions or concerns regarding your invoice.   IF you received labwork today, you will receive an invoice from [facility]. Please contact [facility] at [phone] with questions or concerns regarding your invoice.   Our billing staff will not be able to assist you with questions regarding bills from these companies.  You will be contacted with the lab results as soon as they are available. The fastest way to get your results is to activate your My Chart account. Instructions are located on the last page of this paperwork. If you have not heard from [place] regarding the results in 2 weeks, please contact this office.

## 2018-02-28 NOTE — Progress Notes (Signed)
Subjective:    Patient ID: [name], female    DOB: [birth year], 42 y.o.   MRN: [id number]  02/28/2018  Chronic Conditions (1 month follow-up ) and B12 Injection    HPI This 42 y.o. female presents for one-month follow-up of vitamin B12 deficiency, malnutrition secondary to bariatric surgery with chronic nausea and vomiting.  Management changes made at last visit include the following:  Labs are stable: Anemia persistent with hemoglobin of 9.0. Thyroid function studies are normal. Liver and kidney functions are normal. Sugar/glucose is normal. Thyroid ultrasound: WNL.   UPDATE: Scheduled for LEFT TKR by Dr. Alvan Dame. Dr. Bayard Males recommended refill of Linzess; does not see [facility] for several weeks. Weekly Vitamin D since 90 days ago. Still not eating anything; advised not to try.  Follow-up with [facility] on 03/07/18.  All tube feedings.  Oozes a lot from tube; continuous feeding.  No odor from G-tube.  Just had to have it cauterized again.   [facility] is having patient going to all of these appointments.  [facility] requiring patient ot attend all of these appointments.  Psychiatrist, physical therapist to evaluate functional status. Vomited x 1 last week; vomited x 2 the week before.  Constipation; having b.m. Every other day on average.  BP Readings from Last 3 Encounters:  02/28/18 110/68  01/26/18 110/68  01/10/18 111/71   Wt Readings from Last 3 Encounters:  02/28/18 189 lb (85.7 kg)  01/27/18 180 lb 6.4 oz (81.8 kg)  01/26/18 177 lb (80.3 kg)   Immunization History  Administered Date(s) Administered  . Influenza,inj,Quad PF,6+ Mos 09/02/2015, 07/21/2016, 05/19/2017  . Influenza-Unspecified 07/30/2014  . Tdap 05/18/2015    Review of Systems  Constitutional: Negative for activity change, appetite change, chills, diaphoresis, fatigue, fever and unexpected weight change.  HENT: Negative for congestion, dental problem, drooling, ear discharge, ear pain, facial swelling, hearing  loss, mouth sores, nosebleeds, postnasal drip, rhinorrhea, sinus pressure, sneezing, sore throat, tinnitus, trouble swallowing and voice change.   Eyes: Negative for photophobia, pain, discharge, redness, itching and visual disturbance.  Respiratory: Negative for apnea, cough, choking, chest tightness, shortness of breath, wheezing and stridor.   Cardiovascular: Negative for chest pain, palpitations and leg swelling.  Gastrointestinal: Positive for constipation, nausea and vomiting. Negative for abdominal distention, abdominal pain, anal bleeding, blood in stool, diarrhea and rectal pain.  Endocrine: Negative for cold intolerance, heat intolerance, polydipsia, polyphagia and polyuria.  Genitourinary: Positive for dysuria. Negative for decreased urine volume, difficulty urinating, dyspareunia, enuresis, flank pain, frequency, genital sores, hematuria, menstrual problem, pelvic pain, urgency, vaginal bleeding, vaginal discharge and vaginal pain.  Musculoskeletal: Positive for arthralgias, gait problem and joint swelling. Negative for back pain, myalgias, neck pain and neck stiffness.  Skin: Negative for color change, pallor, rash and wound.  Allergic/Immunologic: Negative for environmental allergies, food allergies and immunocompromised state.  Neurological: Negative for dizziness, tremors, seizures, syncope, facial asymmetry, speech difficulty, weakness, light-headedness, numbness and headaches.  Hematological: Negative for adenopathy. Does not bruise/bleed easily.  Psychiatric/Behavioral: Positive for dysphoric mood. Negative for agitation, behavioral problems, confusion, decreased concentration, hallucinations, self-injury, sleep disturbance and suicidal ideas. The patient is nervous/anxious. The patient is not hyperactive.     Past Medical History:  Diagnosis Date  . Anal fissure - posterior 10/16/2014    OCC  ISSUES  . Anxiety    doesn't take anything  . Asthma    has Albuterol inhaler as  needed  . Boil    on pubic area started septra 03-01-17 draining  blood and pus  . Bronchitis   . Chronic headache disorder 07/22/2016  . Clostridium difficile infection 04/20/2012  . Colitis   . Dehydration   . Depression   . Family history of adverse reaction to anesthesia    pt mom gets sick  . GERD (gastroesophageal reflux disease)    takes Pantoprazole daily  . Headache   . Hiatal hernia    neuropathy - mild in arms and legs  . History of blood transfusion    last transfusion was 04/04/2016=Benadryl was given d/t itching. States she always itches with transfusion.   Marland Kitchen History of bronchitis    > 2 yrs ago  . History of colon polyps    benign  . History of migraine    last one 05/01/16  . History of stomach ulcers   . History of urinary tract infection LAST 2 WEEKS AGO  . IDA (iron deficiency anemia)   . Internal and external bleeding hemorrhoids 06/11/2014  . Joint pain   . Joint swelling   . Left sided chronic colitis - segmental 06/11/2014  . Migraine   . Motion sickness   . Nausea    takes Zofran as needed  . Nausea and vomiting    for 1 year  . Obesity   . Oligouria   . Osteoarthritis    lower back, knees, wrists - no meds  . Pneumonia 1997  . Postoperative nausea and vomiting 01/21/2016   wants scopolamine patch  . SVD (spontaneous vaginal delivery)    x 4  . Tingling    BOTH LOWER EXTREMETIES ALL THE TIME DUE TO RAPID WEIGHT LOSS  . TOBACCO USER 10/02/2009   Qualifier: Diagnosis of  By: Dimas Millin MD, Ellard Artis    . Transfusion history    last transfusion 6'16   . UC (ulcerative colitis) ([place])    supposed to be taking Lialda and Bentyl but has been off since gastric sleeve  . Ulcerative colitis ([place])   . Vertigo    doesn't take any meds   Past Surgical History:  Procedure Laterality Date  . ABDOMINAL HYSTERECTOMY     PARTIAL  . COLONOSCOPY  2007   for rectal bleeding; [facility]  . COLONOSCOPY N/A 06/11/2014   Procedure: COLONOSCOPY;  Surgeon: Gatha Mayer, MD;   Location: [facility];  Service: Endoscopy;  Laterality: N/A;  . COLONOSCOPY WITH PROPOFOL N/A 03/02/2017   Procedure: COLONOSCOPY WITH PROPOFOL;  Surgeon: Alphonsa Overall, MD;  Location: [facility];  Service: General;  Laterality: N/A;  . DILATION AND CURETTAGE OF UTERUS    . ESOPHAGOGASTRODUODENOSCOPY (EGD) WITH PROPOFOL N/A 04/06/2016   Procedure: ESOPHAGOGASTRODUODENOSCOPY (EGD) WITH PROPOFOL;  Surgeon: Alphonsa Overall, MD;  Location: [facility];  Service: General;  Laterality: N/A;  . ESOPHAGOGASTRODUODENOSCOPY (EGD) WITH PROPOFOL N/A 03/02/2017   Procedure: ESOPHAGOGASTRODUODENOSCOPY (EGD) WITH PROPOFOL;  Surgeon: Alphonsa Overall, MD;  Location: [facility];  Service: General;  Laterality: N/A;  . ESOPHAGOGASTRODUODENOSCOPY (EGD) WITH PROPOFOL N/A 05/20/2017   Procedure: ESOPHAGOGASTRODUODENOSCOPY (EGD) WITH PROPOFOL ERAS PATHWAY;  Surgeon: Alphonsa Overall, MD;  Location: [facility];  Service: General;  Laterality: N/A;  . ESOPHAGOGASTRODUODENOSCOPY (EGD) WITH PROPOFOL N/A 10/07/2017   Procedure: ESOPHAGOGASTRODUODENOSCOPY (EGD) WITH PROPOFOL;  Surgeon: Alphonsa Overall, MD;  Location: [facility];  Service: General;  Laterality: N/A;  . EXCISION OF SKIN TAG  06/08/2017   Procedure: EXCISION OF VULVAR SKIN TAGS X2;  Surgeon: [name], MD;  Location: [facility];  Service: Gynecology;;  . FOOT SURGERY Bilateral  x 2  . GASTRIC ROUX-EN-Y N/A 12/14/2016   Procedure: LAPAROSCOPIC REVISION SLEEVE GASTRECTOMY TO  ROUX-Y-GASTRIC BY-PASS, UPPER ENDO;  Surgeon: Excell Seltzer, MD;  Location: [facility];  Service: General;  Laterality: N/A;  . GASTROJEJUNOSTOMY N/A 05/11/2016   Procedure: LAPAROSCOPIC PLACEMENT  OF FEEDING  JEJUNOSTOMY TUBE;  Surgeon: Excell Seltzer, MD;  Location: [facility];  Service: General;  Laterality: N/A;  . HEMORRHOIDECTOMY WITH HEMORRHOID BANDING    . IR FLUORO GUIDE CV LINE RIGHT  04/06/2017  . IR GENERIC HISTORICAL  05/19/2016   IR CM INJ ANY COLONIC TUBE W/FLUORO 05/19/2016 Aletta Edouard, MD [facility]  . IR PATIENT EVAL TECH 0-60 MINS  11/30/2017  . IR US GUIDE VASC ACCESS RIGHT  04/06/2017  . j tube removed march 2018    . KNEE ARTHROSCOPY Left 09/06/2014  . LAPAROSCOPIC GASTRIC SLEEVE RESECTION N/A 01/14/2016   Procedure: LAPAROSCOPIC GASTRIC SLEEVE RESECTION;  Surgeon: Excell Seltzer, MD;  Location: [facility];  Service: General;  Laterality: N/A;  . LAPAROSCOPIC REVISION OF GASTROJEJUNOSTOMY N/A 10/25/2017   Procedure: LAPAROSCOPIC REVISION OF GASTROJEJUNOSTOMY AND PARTIAL GASTRECTOMY, WITH PLACEMENT OF FEEDING GASTROSTOMY TUBE;  Surgeon: Excell Seltzer, MD;  Location: [facility];  Service: General;  Laterality: N/A;  . LAPAROSCOPIC TUBAL LIGATION  10/16/2011   Procedure: LAPAROSCOPIC TUBAL LIGATION;  Surgeon: Alwyn Pea, MD;  Location: [facility];  Service: Gynecology;  Laterality: Bilateral;  . NOVASURE ABLATION  09/28/2010   mild persistent vaginal bleeding  . right knee arthroscopy     05/07/2016  . TUBAL LIGATION    . VAGINAL HYSTERECTOMY Bilateral 06/08/2017   Procedure: HYSTERECTOMY VAGINAL W/ BILATERAL SALPINGECTOMY;  Surgeon: [name], MD;  Location: [facility];  Service: Gynecology;  Laterality: Bilateral;  . wisdom teeth extracted     Allergies  Allergen Reactions  . Coconut Oil Anaphylaxis and Itching  . Morphine And Related Anaphylaxis and Hives    Pt states that she has tolerated Norco and Dilaudid  . Oxycodone Anaphylaxis  . Ciprofloxacin Itching and Rash  . Doxycycline Nausea And Vomiting  . Adhesive [Tape] Rash    And paper tape causes a rash if wearing for a prolong period of time  . Penicillins Rash    Has patient had a PCN reaction causing immediate rash, facial/tongue/throat swelling, SOB or lightheadedness with hypotension: Yes Has patient had a PCN reaction causing severe rash involving mucus membranes or skin necrosis: No Has patient had a PCN reaction that required hospitalization No Has patient had a PCN reaction occurring within  the last 10 years: No If all of the above answers are "NO", then may proceed with Cephalosporin use.   Current Outpatient Medications on File Prior to Visit  Medication Sig Dispense Refill  . acetaminophen (TYLENOL) 500 MG tablet Take 1,000 mg by mouth daily as needed (PAIN).    Marland Kitchen albuterol (PROVENTIL HFA;VENTOLIN HFA) 108 (90 Base) MCG/ACT inhaler Inhale 2 puffs into the lungs every 6 (six) hours as needed for wheezing or shortness of breath. For shortness of breath (Patient taking differently: Inhale 2 puffs into the lungs every 6 (six) hours as needed for wheezing or shortness of breath. ) 54 g 0  . CALCIUM CITRATE PO 10 mLs by Feeding Tube route every other day. In the afternoon.    . cyanocobalamin (,VITAMIN B-12,) 1000 MCG/ML injection Inject 1,000 mcg into the muscle every 30 (thirty) days.    Marland Kitchen FLUoxetine (PROZAC) 20 MG tablet Take 1 tablet (20 mg total) by mouth  daily. 90 tablet 1  . HYDROcodone-acetaminophen (NORCO/VICODIN) 5-325 MG tablet Take 1 tablet by mouth 3 (three) times daily as needed for pain.  0  . Multiple Vitamin (MULTIVITAMIN) LIQD Place 10 mLs into feeding tube daily. Bariatric Liquid Multivitamin     . Nutritional Supplements (VITAL HP 1.0 CAL) LIQD 65 mL/hr by Feeding Tube route continuous.    . ondansetron (ZOFRAN-ODT) 8 MG disintegrating tablet Take 1 tablet (8 mg total) by mouth every 8 (eight) hours as needed for nausea. 20 tablet 5  . pantoprazole (PROTONIX) 40 MG tablet Take 1 tablet (40 mg total) by mouth 2 (two) times daily before a meal. 180 tablet 1  . thiamine (VITAMIN B-1) 100 MG tablet Take 100 mg by mouth daily at 12 noon.     . Vitamin D, Ergocalciferol, (DRISDOL) 50000 units CAPS capsule Take 1 capsule (50,000 Units total) by mouth every 7 (seven) days. (Patient taking differently: Take 50,000 Units by mouth every Monday. ) 12 capsule 0   Current Facility-Administered Medications on File Prior to Visit  Medication Dose Route Frequency Provider Last Rate  Last Dose  . cyanocobalamin ((VITAMIN B-12)) injection 1,000 mcg  1,000 mcg Intramuscular Q30 days Wardell Honour, MD   1,000 mcg at 06/22/17 1537  . cyanocobalamin ((VITAMIN B-12)) injection 1,000 mcg  1,000 mcg Intramuscular Q30 days Wardell Honour, MD   1,000 mcg at 02/28/18 1154   Social History   Socioeconomic History  . Marital status: Single    Spouse name: Not on file  . Number of children: 3  . Years of education: Some college  . Highest education level: Not on file  Occupational History  . Occupation: Production designer, theatre/television/film    Comment: Currently out on disability d/t surg  Social Needs  . Financial resource strain: Not on file  . Food insecurity:    Worry: Not on file    Inability: Not on file  . Transportation needs:    Medical: Not on file    Non-medical: Not on file  Tobacco Use  . Smoking status: Former Smoker    Packs/day: 0.50    Years: 20.00    Pack years: 10.00    Types: Cigarettes    Last attempt to quit: 03/17/2014    Years since quitting: 3.9  . Smokeless tobacco: Never Used  Substance and Sexual Activity  . Alcohol use: No    Alcohol/week: 0.0 oz  . Drug use: No  . Sexual activity: Not Currently    Birth control/protection: Surgical  Lifestyle  . Physical activity:    Days per week: Not on file    Minutes per session: Not on file  . Stress: Not on file  Relationships  . Social connections:    Talks on phone: Not on file    Gets together: Not on file    Attends religious service: Not on file    Active member of club or organization: Not on file    Attends meetings of clubs or organizations: Not on file    Relationship status: Not on file  . Intimate partner violence:    Fear of current or ex partner: Not on file    Emotionally abused: Not on file    Physically abused: Not on file    Forced sexual activity: Not on file  Other Topics Concern  . Not on file  Social History Narrative   Marital status:  Single; not dating seriously.       Children:  3 sons (13, 80, 2) whose father is deceased, 1 daughter (deceased); no grandchildren      Lives:  With 2 sons      Employment:  Long term disability in 2018 due to PICC line for iv fluids; Corporate treasurer at [employer]      Tobacco: quit June 2015      Alcohol:  None      Drugs:  None      Exercise:  Leg lifts for knee strengthening; walking   1 caffeine beverage daily      Sexual activity:  Active; total partners = up there.  No STDs in past.        Right-handed   Family History  Problem Relation Age of Onset  . Hypertension Mother   . Diabetes Mother   . Sarcoidosis Mother        lungs and skin  . Asthma Mother   . Hypertension Father   . Diabetes Father   . Asthma Son   . Tics Son   . Asthma Sister   . Cancer Sister        possible pancreatic cancer  . Adrenal disorder Sister        Tumor   . Asthma Brother   . Asthma Daughter        died age 64.5  . Cancer Daughter 4       brain; died age 64.5  . Asthma Son   . Cancer Paternal Aunt        brain, colon, lung and esophagus; unsure of primary        Objective:    BP 110/68   Pulse 94   Temp 98 F (36.7 C) (Oral)   Resp 16   Ht 5' 7.13" (1.705 m)   Wt 189 lb (85.7 kg)   LMP 06/02/2017 Comment: hysterectomy 06/08/2017  SpO2 98%   BMI 29.49 kg/m  Physical Exam  Constitutional: She is oriented to person, place, and time. She appears well-developed and well-nourished. No distress.  HENT:  Head: Normocephalic and atraumatic.  Right Ear: External ear normal.  Left Ear: External ear normal.  Nose: Nose normal.  Mouth/Throat: Oropharynx is clear and moist.  Eyes: Pupils are equal, round, and reactive to light. Conjunctivae and EOM are normal.  Neck: Normal range of motion. Neck supple. Carotid bruit is not present. No thyromegaly present.  Cardiovascular: Normal rate, regular rhythm, normal heart sounds and intact distal pulses. Exam reveals no gallop and no friction rub.  No murmur  heard. Pulmonary/Chest: Effort normal and breath sounds normal. She has no wheezes. She has no rales.  Abdominal: Soft. Bowel sounds are normal. She exhibits no distension and no mass. There is no tenderness. There is no rebound and no guarding.  Lymphadenopathy:    She has no cervical adenopathy.  Neurological: She is alert and oriented to person, place, and time. No cranial nerve deficit.  Skin: Skin is warm and dry. No rash noted. She is not diaphoretic. No erythema. No pallor.  Psychiatric: She has a normal mood and affect. Her behavior is normal.   No results found. Depression screen [facility] 2/9 02/28/2018 01/26/2018 12/22/2017 12/15/2017 09/22/2017  Decreased Interest 0 2 1 3 1   Down, Depressed, Hopeless 0 2 1 3 1   PHQ - 2 Score 0 4 2 6 2   Altered sleeping - 1 - 3 1  Tired, decreased energy - 1 - 3 1  Change in appetite - 1 - 3 0  Feeling bad or failure about yourself  - 1 - 3 1  Trouble concentrating - - - 3 0  Moving slowly or fidgety/restless - - - 3 0  Suicidal thoughts - - - 0 0  PHQ-9 Score - 8 - 24 5  Difficult doing work/chores - - - - -  Some recent data might be hidden   Fall Risk  02/28/2018 01/26/2018 01/01/2018 12/22/2017 12/15/2017  Falls in the past year? No No Yes No No  Number falls in past yr: - - 2 or more - -  Injury with Fall? - - Yes - -  Comment - - - - -  Follow up - - - - -        Assessment & Plan:   1. Vitamin D deficiency   2. Vitamin B12 deficiency   3. Pre-operative clearance   4. Dysuria   5. Reactive depression   6. S/P laparoscopic sleeve gastrectomy   7. Iron deficiency anemia secondary to inadequate dietary iron intake   8. Intractable cyclical vomiting with nausea   9. Primary osteoarthritis of left knee     Dysuria: New; send urine cx.  LEFT knee osteoarthritis: chronic; scheduled for total knee replacement; obtain labs for medical clearance.  S/p recent cardiology consultation with negative work up.    Intractable nausea with vomiting:  following bariatric surgery; maintained with feeding tube.  Managed by general surgery and nutrition.    Constipation: chronic; rx for Linzess provided.   Iron deficiency anemia with Crohn's disease: chronic; obtain labs.  Vitamin D deficiency: persistent despite supplementation; repeat labs.    Anxiety and depression: stable; continue psychotherapy.  Orders Placed This Encounter  Procedures  . Urine Culture    Order Specific Question:   Source    Answer:   clean catch  . CBC with Differential/Platelet  . Comprehensive metabolic panel  . Vitamin B12  . VITAMIN D 25 Hydroxy (Vit-D Deficiency, Fractures)  . POCT urinalysis dipstick   Meds ordered this encounter  Medications  . linaclotide (LINZESS) 290 MCG CAPS capsule    Sig: Take 1 capsule (290 mcg total) by mouth daily before breakfast.    Dispense:  90 capsule    Refill:  1    Return in about 1 month (around 03/28/2018) for recheck.   Ashleigh Arya Elayne Guerin, M.D. [facility] [address] [place], [place]  [address] [phone] phone [phone] fax

## 2018-03-01 LAB — COMPREHENSIVE METABOLIC PANEL
ALBUMIN: 3.9 g/dL (ref 3.5–5.5)
ALK PHOS: 62 IU/L (ref 39–117)
ALT: 18 IU/L (ref 0–32)
AST: 24 IU/L (ref 0–40)
Albumin/Globulin Ratio: 1.2 (ref 1.2–2.2)
BUN / CREAT RATIO: 21 (ref 9–23)
BUN: 15 mg/dL (ref 6–24)
Bilirubin Total: 0.2 mg/dL (ref 0.0–1.2)
CO2: 20 mmol/L (ref 20–29)
CREATININE: 0.72 mg/dL (ref 0.57–1.00)
Calcium: 9.1 mg/dL (ref 8.7–10.2)
Chloride: 105 mmol/L (ref 96–106)
GFR calc Af Amer: 121 mL/min/{1.73_m2} (ref >59)
GFR calc non Af Amer: 105 mL/min/{1.73_m2} (ref >59)
GLUCOSE: 53 mg/dL — AB (ref 65–99)
Globulin, Total: 3.2 g/dL (ref 1.5–4.5)
Potassium: 4.7 mmol/L (ref 3.5–5.2)
Sodium: 139 mmol/L (ref 134–144)
Total Protein: 7.1 g/dL (ref 6.0–8.5)

## 2018-03-01 LAB — CBC WITH DIFFERENTIAL/PLATELET
BASOS ABS: 0 10*3/uL (ref 0.0–0.2)
Basos: 0 %
EOS (ABSOLUTE): 0.3 10*3/uL (ref 0.0–0.4)
Eos: 4 %
HEMOGLOBIN: 8.3 g/dL — AB (ref 11.1–15.9)
Hematocrit: 27.7 % — ABNORMAL LOW (ref 34.0–46.6)
IMMATURE GRANS (ABS): 0 10*3/uL (ref 0.0–0.1)
Immature Granulocytes: 0 %
LYMPHS: 42 %
Lymphocytes Absolute: 3.3 10*3/uL — ABNORMAL HIGH (ref 0.7–3.1)
MCH: 23.4 pg — ABNORMAL LOW (ref 26.6–33.0)
MCHC: 30 g/dL — AB (ref 31.5–35.7)
MCV: 78 fL — ABNORMAL LOW (ref 79–97)
MONOCYTES: 5 %
Monocytes Absolute: 0.4 10*3/uL (ref 0.1–0.9)
Neutrophils Absolute: 3.9 10*3/uL (ref 1.4–7.0)
Neutrophils: 49 %
PLATELETS: 366 10*3/uL (ref 150–450)
RBC: 3.55 x10E6/uL — AB (ref 3.77–5.28)
RDW: 16.1 % — ABNORMAL HIGH (ref 12.3–15.4)
WBC: 7.9 10*3/uL (ref 3.4–10.8)

## 2018-03-01 LAB — VITAMIN D 25 HYDROXY (VIT D DEFICIENCY, FRACTURES): VIT D 25 HYDROXY: 18.9 ng/mL — AB (ref 30.0–100.0)

## 2018-03-01 LAB — URINE CULTURE: ORGANISM ID, BACTERIA: NO GROWTH

## 2018-03-01 LAB — VITAMIN B12

## 2018-03-01 NOTE — Patient Instructions (Addendum)
[name]  03/01/2018   Your procedure is scheduled on: 03-10-18  Report to [facility] Main  Entrance    Report to Admitting at 5:30 AM    Call this number if you have problems the morning of surgery [phone]   Remember: Do not eat food or drink liquids :After Midnight.     Take these medicines the morning of surgery with A SIP OF WATER: None per pt's preference                               You may not have any metal on your body including hair pins and              piercings  Do not wear jewelry, make-up, lotions, powders or perfumes, deodorant             Do not wear nail polish.  Do not shave  48 hours prior to surgery.                 Do not bring valuables to the hospital. [place].  Contacts, dentures or bridgework may not be worn into surgery.  Leave suitcase in the car. After surgery it may be brought to your room.     Special Instructions: N/A              Please read over the following fact sheets you were given: _____________________________________________________________________           [facility] - Preparing for Surgery Before surgery, you can play an important role.  Because skin is not sterile, your skin needs to be as free of germs as possible.  You can reduce the number of germs on your skin by washing with CHG (chlorahexidine gluconate) soap before surgery.  CHG is an antiseptic cleaner which kills germs and bonds with the skin to continue killing germs even after washing. Please DO NOT use if you have an allergy to CHG or antibacterial soaps.  If your skin becomes reddened/irritated stop using the CHG and inform your nurse when you arrive at Short Stay. Do not shave (including legs and underarms) for at least 48 hours prior to the first CHG shower.  You may shave your face/neck. Please follow these instructions carefully:  1.  Shower with CHG Soap the night before surgery  and the  morning of Surgery.  2.  If you choose to wash your hair, wash your hair first as usual with your  normal  shampoo.  3.  After you shampoo, rinse your hair and body thoroughly to remove the  shampoo.                           4.  Use CHG as you would any other liquid soap.  You can apply chg directly  to the skin and wash                       Gently with a scrungie or clean washcloth.  5.  Apply the CHG Soap to your body ONLY FROM THE NECK DOWN.   Do not use on face/ open  Wound or open sores. Avoid contact with eyes, ears mouth and genitals (private parts).                       Wash face,  Genitals (private parts) with your normal soap.             6.  Wash thoroughly, paying special attention to the area where your surgery  will be performed.  7.  Thoroughly rinse your body with warm water from the neck down.  8.  DO NOT shower/wash with your normal soap after using and rinsing off  the CHG Soap.                9.  Pat yourself dry with a clean towel.            10.  Wear clean pajamas.            11.  Place clean sheets on your bed the night of your first shower and do not  sleep with pets. Day of Surgery : Do not apply any lotions/deodorants the morning of surgery.  Please wear clean clothes to the hospital/surgery center.  FAILURE TO FOLLOW THESE INSTRUCTIONS MAY RESULT IN THE CANCELLATION OF YOUR SURGERY PATIENT SIGNATURE_________________________________  NURSE SIGNATURE__________________________________  ________________________________________________________________________   [name]  An incentive spirometer is a tool that can help keep your lungs clear and active. This tool measures how well you are filling your lungs with each breath. Taking long deep breaths may help reverse or decrease the chance of developing breathing (pulmonary) problems (especially infection) following:  A long period of time when you are unable to move or  be active. BEFORE THE PROCEDURE   If the spirometer includes an indicator to show your best effort, your nurse or respiratory therapist will set it to a desired goal.  If possible, sit up straight or lean slightly forward. Try not to slouch.  Hold the incentive spirometer in an upright position. INSTRUCTIONS FOR USE  1. Sit on the edge of your bed if possible, or sit up as far as you can in bed or on a chair. 2. Hold the incentive spirometer in an upright position. 3. Breathe out normally. 4. Place the mouthpiece in your mouth and seal your lips tightly around it. 5. Breathe in slowly and as deeply as possible, raising the piston or the ball toward the top of the column. 6. Hold your breath for 3-5 seconds or for as long as possible. Allow the piston or ball to fall to the bottom of the column. 7. Remove the mouthpiece from your mouth and breathe out normally. 8. Rest for a few seconds and repeat Steps 1 through 7 at least 10 times every 1-2 hours when you are awake. Take your time and take a few normal breaths between deep breaths. 9. The spirometer may include an indicator to show your best effort. Use the indicator as a goal to work toward during each repetition. 10. After each set of 10 deep breaths, practice coughing to be sure your lungs are clear. If you have an incision (the cut made at the time of surgery), support your incision when coughing by placing a pillow or rolled up towels firmly against it. Once you are able to get out of bed, walk around indoors and cough well. You may stop using the incentive spirometer when instructed by your caregiver.  RISKS AND COMPLICATIONS  Take your time so you do not get  dizzy or light-headed.  If you are in pain, you may need to take or ask for pain medication before doing incentive spirometry. It is harder to take a deep breath if you are having pain. AFTER USE  Rest and breathe slowly and easily.  It can be helpful to keep track of a log  of your progress. Your caregiver can provide you with a simple table to help with this. If you are using the spirometer at home, follow these instructions: [place] IF:   You are having difficultly using the spirometer.  You have trouble using the spirometer as often as instructed.  Your pain medication is not giving enough relief while using the spirometer.  You develop fever of 100.5 F (38.1 C) or higher. SEEK IMMEDIATE MEDICAL CARE IF:   You cough up bloody sputum that had not been present before.  You develop fever of 102 F (38.9 C) or greater.  You develop worsening pain at or near the incision site. MAKE SURE YOU:   Understand these instructions.  Will watch your condition.  Will get help right away if you are not doing well or get worse. Document Released: 01/25/2007 Document Revised: 12/07/2011 Document Reviewed: 03/28/2007 [employer] Patient Information 2014 [employer], [place].   ________________________________________________________________________  WHAT IS A BLOOD TRANSFUSION? Blood Transfusion Information  A transfusion is the replacement of blood or some of its parts. Blood is made up of multiple cells which provide different functions.  Red blood cells carry oxygen and are used for blood loss replacement.  White blood cells fight against infection.  Platelets control bleeding.  Plasma helps clot blood.  Other blood products are available for specialized needs, such as hemophilia or other clotting disorders. BEFORE THE TRANSFUSION  Who gives blood for transfusions?   Healthy volunteers who are fully evaluated to make sure their blood is safe. This is blood bank blood. Transfusion therapy is the safest it has ever been in the practice of medicine. Before blood is taken from a donor, a complete history is taken to make sure that person has no history of diseases nor engages in risky social behavior (examples are intravenous drug use or sexual  activity with multiple partners). The donor's travel history is screened to minimize risk of transmitting infections, such as malaria. The donated blood is tested for signs of infectious diseases, such as HIV and hepatitis. The blood is then tested to be sure it is compatible with you in order to minimize the chance of a transfusion reaction. If you or a relative donates blood, this is often done in anticipation of surgery and is not appropriate for emergency situations. It takes many days to process the donated blood. RISKS AND COMPLICATIONS Although transfusion therapy is very safe and saves many lives, the main dangers of transfusion include:   Getting an infectious disease.  Developing a transfusion reaction. This is an allergic reaction to something in the blood you were given. Every precaution is taken to prevent this. The decision to have a blood transfusion has been considered carefully by your caregiver before blood is given. Blood is not given unless the benefits outweigh the risks. AFTER THE TRANSFUSION  Right after receiving a blood transfusion, you will usually feel much better and more energetic. This is especially true if your red blood cells have gotten low (anemic). The transfusion raises the level of the red blood cells which carry oxygen, and this usually causes an energy increase.  The nurse administering the transfusion will  monitor you carefully for complications. HOME CARE INSTRUCTIONS  No special instructions are needed after a transfusion. You may find your energy is better. Speak with your caregiver about any limitations on activity for underlying diseases you may have. SEEK MEDICAL CARE IF:   Your condition is not improving after your transfusion.  You develop redness or irritation at the intravenous (IV) site. SEEK IMMEDIATE MEDICAL CARE IF:  Any of the following symptoms occur over the next 12 hours:  Shaking chills.  You have a temperature by mouth above 102 F  (38.9 C), not controlled by medicine.  Chest, back, or muscle pain.  People around you feel you are not acting correctly or are confused.  Shortness of breath or difficulty breathing.  Dizziness and fainting.  You get a rash or develop hives.  You have a decrease in urine output.  Your urine turns a dark color or changes to pink, red, or brown. Any of the following symptoms occur over the next 10 days:  You have a temperature by mouth above 102 F (38.9 C), not controlled by medicine.  Shortness of breath.  Weakness after normal activity.  The white part of the eye turns yellow (jaundice).  You have a decrease in the amount of urine or are urinating less often.  Your urine turns a dark color or changes to pink, red, or brown. Document Released: 09/11/2000 Document Revised: 12/07/2011 Document Reviewed: 04/30/2008 [facility] Patient Information 2014 [place], [place].  _______________________________________________________________________

## 2018-03-02 ENCOUNTER — Other Ambulatory Visit (HOSPITAL_COMMUNITY): Payer: 59

## 2018-03-03 ENCOUNTER — Other Ambulatory Visit: Payer: Self-pay

## 2018-03-03 ENCOUNTER — Encounter (HOSPITAL_COMMUNITY)
Admission: RE | Admit: 2018-03-03 | Discharge: 2018-03-03 | Disposition: A | Discharge status: Home / Self Care | Payer: 59 | Source: Ambulatory Visit | Attending: Orthopedic Surgery | Admitting: Orthopedic Surgery

## 2018-03-03 ENCOUNTER — Encounter (HOSPITAL_COMMUNITY): Payer: Self-pay

## 2018-03-03 DIAGNOSIS — Z01812 Encounter for preprocedural laboratory examination: Secondary | ICD-10-CM | POA: Insufficient documentation

## 2018-03-03 LAB — BASIC METABOLIC PANEL
ANION GAP: 7 (ref 5–15)
BUN: 14 mg/dL (ref 6–20)
CHLORIDE: 107 mmol/L (ref 101–111)
CO2: 25 mmol/L (ref 22–32)
Calcium: 9.1 mg/dL (ref 8.9–10.3)
Creatinine, Ser: 0.71 mg/dL (ref 0.44–1.00)
GFR calc non Af Amer: >60 mL/min (ref >60)
Glucose, Bld: 75 mg/dL (ref 65–99)
POTASSIUM: 4.5 mmol/L (ref 3.5–5.1)
SODIUM: 139 mmol/L (ref 135–145)

## 2018-03-03 LAB — CBC
HEMATOCRIT: 28.4 % — AB (ref 36.0–46.0)
Hemoglobin: 8.5 g/dL — ABNORMAL LOW (ref 12.0–15.0)
MCH: 23.7 pg — ABNORMAL LOW (ref 26.0–34.0)
MCHC: 29.9 g/dL — ABNORMAL LOW (ref 30.0–36.0)
MCV: 79.3 fL (ref 78.0–100.0)
Platelets: 411 10*3/uL — ABNORMAL HIGH (ref 150–400)
RBC: 3.58 MIL/uL — AB (ref 3.87–5.11)
RDW: 15.8 % — AB (ref 11.5–15.5)
WBC: 8.7 10*3/uL (ref 4.0–10.5)

## 2018-03-06 DIAGNOSIS — Z9884 Bariatric surgery status: Secondary | ICD-10-CM | POA: Diagnosis not present

## 2018-03-06 DIAGNOSIS — R269 Unspecified abnormalities of gait and mobility: Secondary | ICD-10-CM | POA: Diagnosis not present

## 2018-03-06 DIAGNOSIS — K922 Gastrointestinal hemorrhage, unspecified: Secondary | ICD-10-CM | POA: Diagnosis not present

## 2018-03-06 NOTE — H&P (Signed)
TOTAL KNEE ADMISSION H&P  Patient is being admitted for left total knee arthroplasty.  Subjective:  Chief Complaint:  Left knee primary OA / pain  HPI: [name], 42 y.o. female, has a history of pain and functional disability in the left knee due to arthritis and has failed non-surgical conservative treatments for greater than 12 weeks to includeNSAID's and/or analgesics, corticosteriod injections, viscosupplementation injections and activity modification.  Onset of symptoms was gradual, starting 3+ years ago with gradually worsening course since that time. The patient noted prior procedures on the knee to include  arthroscopy on the left knee(s).  Patient currently rates pain in the left knee(s) at 10 out of 10 with activity. Patient has night pain, worsening of pain with activity and weight bearing, pain that interferes with activities of daily living, pain with passive range of motion, crepitus and joint swelling.  Patient has evidence of periarticular osteophytes and joint space narrowing by imaging studies. There is no active infection.  Risks, benefits and expectations were discussed with the patient.  Risks including but not limited to the risk of anesthesia, blood clots, nerve damage, blood vessel damage, failure of the prosthesis, infection and up to and including death.  Patient understand the risks, benefits and expectations and wishes to proceed with surgery.   PCP: Wardell Honour, MD  D/C Plans:       Home  Post-op Meds:       No Rx given  Tranexamic Acid:      To be given - IV   Decadron:      Is to be given  FYI:     Lovenox injections (Multiple GI issues and surgeries)  Dilaudid (unable to tolerate Codeine and oxy)  DME:   Pt already has equipment   PT:   HH[facility]    Patient Active Problem List   Diagnosis Date Noted  . B12 deficiency 12/01/2017  . Precordial chest pain 11/04/2017  . Rapid or irregular heartbeat 11/04/2017  . Chest pain at rest 11/04/2017  .  Right shoulder pain 11/04/2017  . Marginal ulcer 10/25/2017  . Dehydration 04/02/2017  . Malnutrition, calorie ([place]) 03/27/2017  . Psychophysiological insomnia 03/18/2017  . Nausea and vomiting 12/14/2016  . Paresthesia 07/22/2016  . Chronic headache disorder 07/22/2016  . Memory difficulty 07/22/2016  . Hematochezia 04/04/2016  . 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 Anemia, iron deficiency 10/01/2015  . Anal fissure - posterior 10/16/2014  . Left sided chronic colitis - segmental 06/11/2014  . Internal and external bleeding hemorrhoids 06/11/2014  . Asthma 04/26/2012  . Reactive depression 10/02/2009   Past Medical History:  Diagnosis Date  . Anal fissure - posterior 10/16/2014    OCC  ISSUES  . Anxiety    doesn't take anything  . Asthma    has Albuterol inhaler as needed  . Boil    on pubic area started septra 03-01-17 draining blood and pus  . Bronchitis   . Chronic headache disorder 07/22/2016  . Clostridium difficile infection 04/20/2012  . Colitis   . Dehydration   . Depression   . Family history of adverse reaction to anesthesia    pt mom gets sick  . GERD (gastroesophageal reflux disease)    takes Pantoprazole daily  . Headache   . Hiatal hernia    neuropathy - mild in arms and legs  . History of blood transfusion    last transfusion was 04/04/2016=Benadryl was given d/t itching. States she always itches with transfusion.   Marland Kitchen  History of bronchitis    > 2 yrs ago  . History of colon polyps    benign  . History of migraine    last one 05/01/16  . History of stomach ulcers   . History of urinary tract infection LAST 2 WEEKS AGO  . IDA (iron deficiency anemia)   . Internal and external bleeding hemorrhoids 06/11/2014  . Joint pain   . Joint swelling   . Left sided chronic colitis - segmental 06/11/2014  . Migraine   . Motion sickness   . Nausea    takes Zofran as needed  . Nausea and vomiting    for 1 year  . Obesity   . Oligouria   .  Osteoarthritis    lower back, knees, wrists - no meds  . Pneumonia 1997  . Postoperative nausea and vomiting 01/21/2016   wants scopolamine patch  . SVD (spontaneous vaginal delivery)    x 4  . Tingling    BOTH LOWER EXTREMETIES ALL THE TIME DUE TO RAPID WEIGHT LOSS  . TOBACCO USER 10/02/2009   Qualifier: Diagnosis of  By: Dimas Millin MD, Ellard Artis    . Transfusion history    last transfusion 6'16   . UC (ulcerative colitis) ([place])    supposed to be taking Lialda and Bentyl but has been off since gastric sleeve  . Ulcerative colitis ([place])   . Vertigo    doesn't take any meds    Past Surgical History:  Procedure Laterality Date  . ABDOMINAL HYSTERECTOMY     PARTIAL  . COLONOSCOPY  2007   for rectal bleeding; [facility]  . COLONOSCOPY N/A 06/11/2014   Procedure: COLONOSCOPY;  Surgeon: Gatha Mayer, MD;  Location: [facility];  Service: Endoscopy;  Laterality: N/A;  . COLONOSCOPY WITH PROPOFOL N/A 03/02/2017   Procedure: COLONOSCOPY WITH PROPOFOL;  Surgeon: Alphonsa Overall, MD;  Location: [facility];  Service: General;  Laterality: N/A;  . DILATION AND CURETTAGE OF UTERUS    . ESOPHAGOGASTRODUODENOSCOPY (EGD) WITH PROPOFOL N/A 04/06/2016   Procedure: ESOPHAGOGASTRODUODENOSCOPY (EGD) WITH PROPOFOL;  Surgeon: Alphonsa Overall, MD;  Location: [facility];  Service: General;  Laterality: N/A;  . ESOPHAGOGASTRODUODENOSCOPY (EGD) WITH PROPOFOL N/A 03/02/2017   Procedure: ESOPHAGOGASTRODUODENOSCOPY (EGD) WITH PROPOFOL;  Surgeon: Alphonsa Overall, MD;  Location: [facility];  Service: General;  Laterality: N/A;  . ESOPHAGOGASTRODUODENOSCOPY (EGD) WITH PROPOFOL N/A 05/20/2017   Procedure: ESOPHAGOGASTRODUODENOSCOPY (EGD) WITH PROPOFOL ERAS PATHWAY;  Surgeon: Alphonsa Overall, MD;  Location: [facility];  Service: General;  Laterality: N/A;  . ESOPHAGOGASTRODUODENOSCOPY (EGD) WITH PROPOFOL N/A 10/07/2017   Procedure: ESOPHAGOGASTRODUODENOSCOPY (EGD) WITH PROPOFOL;  Surgeon: Alphonsa Overall, MD;  Location: [facility];   Service: General;  Laterality: N/A;  . EXCISION OF SKIN TAG  06/08/2017   Procedure: EXCISION OF VULVAR SKIN TAGS X2;  Surgeon: [name], MD;  Location: [facility];  Service: Gynecology;;  . FOOT SURGERY Bilateral    x 2  . GASTRIC ROUX-EN-Y N/A 12/14/2016   Procedure: LAPAROSCOPIC REVISION SLEEVE GASTRECTOMY TO  ROUX-Y-GASTRIC BY-PASS, UPPER ENDO;  Surgeon: Excell Seltzer, MD;  Location: [facility];  Service: General;  Laterality: N/A;  . GASTROJEJUNOSTOMY N/A 05/11/2016   Procedure: LAPAROSCOPIC PLACEMENT  OF FEEDING  JEJUNOSTOMY TUBE;  Surgeon: Excell Seltzer, MD;  Location: [facility];  Service: General;  Laterality: N/A;  . HEMORRHOIDECTOMY WITH HEMORRHOID BANDING    . IR FLUORO GUIDE CV LINE RIGHT  04/06/2017  . IR GENERIC HISTORICAL  05/19/2016   IR CM INJ ANY COLONIC TUBE  W/FLUORO 05/19/2016 Aletta Edouard, MD [facility]  . IR PATIENT EVAL TECH 0-60 MINS  11/30/2017  . IR US GUIDE VASC ACCESS RIGHT  04/06/2017  . j tube removed march 2018    . KNEE ARTHROSCOPY Left 09/06/2014  . LAPAROSCOPIC GASTRIC SLEEVE RESECTION N/A 01/14/2016   Procedure: LAPAROSCOPIC GASTRIC SLEEVE RESECTION;  Surgeon: Excell Seltzer, MD;  Location: [facility];  Service: General;  Laterality: N/A;  . LAPAROSCOPIC REVISION OF GASTROJEJUNOSTOMY N/A 10/25/2017   Procedure: LAPAROSCOPIC REVISION OF GASTROJEJUNOSTOMY AND PARTIAL GASTRECTOMY, WITH PLACEMENT OF FEEDING GASTROSTOMY TUBE;  Surgeon: Excell Seltzer, MD;  Location: [facility];  Service: General;  Laterality: N/A;  . LAPAROSCOPIC TUBAL LIGATION  10/16/2011   Procedure: LAPAROSCOPIC TUBAL LIGATION;  Surgeon: Alwyn Pea, MD;  Location: [facility];  Service: Gynecology;  Laterality: Bilateral;  . NOVASURE ABLATION  09/28/2010   mild persistent vaginal bleeding  . right knee arthroscopy     05/07/2016  . TUBAL LIGATION    . VAGINAL HYSTERECTOMY Bilateral 06/08/2017   Procedure: HYSTERECTOMY VAGINAL W/ BILATERAL SALPINGECTOMY;  Surgeon: [name], MD;  Location: [facility];  Service: Gynecology;  Laterality: Bilateral;  . wisdom teeth extracted      Current Facility-Administered Medications  Medication Dose Route Frequency Provider Last Rate Last Dose  . cyanocobalamin ((VITAMIN B-12)) injection 1,000 mcg  1,000 mcg Intramuscular Q30 days Wardell Honour, MD   1,000 mcg at 06/22/17 1537  . cyanocobalamin ((VITAMIN B-12)) injection 1,000 mcg  1,000 mcg Intramuscular Q30 days Wardell Honour, MD   1,000 mcg at 02/28/18 1154   Current Outpatient Medications  Medication Sig Dispense Refill Last Dose  . acetaminophen (TYLENOL) 500 MG tablet Take 1,000 mg by mouth daily as needed (PAIN).   Taking  . albuterol (PROVENTIL HFA;VENTOLIN HFA) 108 (90 Base) MCG/ACT inhaler Inhale 2 puffs into the lungs every 6 (six) hours as needed for wheezing or shortness of breath. For shortness of breath (Patient taking differently: Inhale 2 puffs into the lungs every 6 (six) hours as needed for wheezing or shortness of breath. ) 54 g 0 Taking  . CALCIUM CITRATE PO 10 mLs by Feeding Tube route every other day. In the afternoon.   Taking  . cyanocobalamin (,VITAMIN B-12,) 1000 MCG/ML injection Inject 1,000 mcg into the muscle every 30 (thirty) days.   Taking  . FLUoxetine (PROZAC) 20 MG tablet Take 1 tablet (20 mg total) by mouth daily. 90 tablet 1 Taking  . HYDROcodone-acetaminophen (NORCO/VICODIN) 5-325 MG tablet Take 1 tablet by mouth 3 (three) times daily as needed for pain.  0 Taking  . Multiple Vitamin (MULTIVITAMIN) LIQD Place 10 mLs into feeding tube daily. Bariatric Liquid Multivitamin    Taking  . Nutritional Supplements (VITAL HP 1.0 CAL) LIQD 65 mL/hr by Feeding Tube route continuous.   Taking  . ondansetron (ZOFRAN-ODT) 8 MG disintegrating tablet Take 1 tablet (8 mg total) by mouth every 8 (eight) hours as needed for nausea. 20 tablet 5 Taking  . pantoprazole (PROTONIX) 40 MG tablet Take 1 tablet (40 mg total) by mouth 2 (two) times daily before a meal. 180 tablet 1 Taking   . thiamine (VITAMIN B-1) 100 MG tablet Take 100 mg by mouth daily at 12 noon.    Taking  . Vitamin D, Ergocalciferol, (DRISDOL) 50000 units CAPS capsule Take 1 capsule (50,000 Units total) by mouth every 7 (seven) days. (Patient taking differently: Take 50,000 Units by mouth every Monday. ) 12 capsule 0 Taking  .  linaclotide (LINZESS) 290 MCG CAPS capsule Take 1 capsule (290 mcg total) by mouth daily before breakfast. 90 capsule 1    Allergies  Allergen Reactions  . Coconut Oil Anaphylaxis and Itching  . Morphine And Related Anaphylaxis and Hives    Pt states that she has tolerated Norco and Dilaudid  . Oxycodone Anaphylaxis  . Ciprofloxacin Itching and Rash  . Doxycycline Nausea And Vomiting  . Adhesive [Tape] Rash    And paper tape causes a rash if wearing for a prolong period of time  . Penicillins Rash    Has patient had a PCN reaction causing immediate rash, facial/tongue/throat swelling, SOB or lightheadedness with hypotension: Yes Has patient had a PCN reaction causing severe rash involving mucus membranes or skin necrosis: No Has patient had a PCN reaction that required hospitalization No Has patient had a PCN reaction occurring within the last 10 years: No If all of the above answers are "NO", then may proceed with Cephalosporin use.    Social History   Tobacco Use  . Smoking status: Former Smoker    Packs/day: 0.50    Years: 20.00    Pack years: 10.00    Types: Cigarettes    Last attempt to quit: 03/17/2014    Years since quitting: 3.9  . Smokeless tobacco: Never Used  Substance Use Topics  . Alcohol use: No    Alcohol/week: 0.0 oz    Family History  Problem Relation Age of Onset  . Hypertension Mother   . Diabetes Mother   . Sarcoidosis Mother        lungs and skin  . Asthma Mother   . Hypertension Father   . Diabetes Father   . Asthma Son   . Tics Son   . Asthma Sister   . Cancer Sister        possible pancreatic cancer  . Adrenal disorder Sister         Tumor   . Asthma Brother   . Asthma Daughter        died age 23.5  . Cancer Daughter 4       brain; died age 23.5  . Asthma Son   . Cancer Paternal Aunt        brain, colon, lung and esophagus; unsure of primary      Review of Systems  Constitutional: Positive for malaise/fatigue.  HENT: Negative.   Eyes: Negative.   Respiratory: Negative.   Cardiovascular: Negative.   Gastrointestinal: Positive for heartburn, nausea and vomiting.  Genitourinary: Negative.   Musculoskeletal: Positive for back pain, joint pain and neck pain.  Skin: Negative.   Neurological: Positive for headaches.  Endo/Heme/Allergies: Negative.   Psychiatric/Behavioral: Positive for memory loss. The patient is nervous/anxious.     Objective:  Physical Exam  Constitutional: She is oriented to person, place, and time. She appears well-developed.  HENT:  Head: Normocephalic.  Eyes: Pupils are equal, round, and reactive to light.  Neck: Neck supple. No JVD present. No tracheal deviation present. No thyromegaly present.  Cardiovascular: Normal rate, regular rhythm and intact distal pulses.  Respiratory: Effort normal and breath sounds normal. No respiratory distress. She has no wheezes.  GI: Soft. There is no tenderness. There is no guarding.  Musculoskeletal:       Left knee: She exhibits decreased range of motion, swelling and bony tenderness. She exhibits no ecchymosis, no deformity, no laceration and no erythema. Tenderness found.  Lymphadenopathy:    She has no cervical adenopathy.  Neurological:  She is alert and oriented to person, place, and time. A sensory deficit (neuropathy inner lower legs) is present.  Skin: Skin is warm and dry.  Psychiatric: She has a normal mood and affect.     Labs:  Estimated body mass index is 29.76 kg/m as calculated from the following:   Height as of 03/03/18: 5' 7"  (1.702 m).   Weight as of 03/03/18: 86.2 kg (190 lb).   Imaging Review Plain radiographs demonstrate  severe degenerative joint disease of the left knee. The bone quality appears to be good for age and reported activity level.   Preoperative templating of the joint replacement has been completed, documented, and submitted to the Operating Room personnel in order to optimize intra-operative equipment management.    Patient's anticipated LOS is less than 2 midnights, meeting these requirements: - Younger than 72 - Lives within 1 hour of care - Has a competent adult at home to recover with post-op recover - NO history of  - Chronic pain requiring opiods  - Diabetes  - Coronary Artery Disease  - Heart failure  - Heart attack  - Stroke  - DVT/VTE  - Cardiac arrhythmia  - Respiratory Failure/COPD  - Renal failure  - Advanced Liver disease        Assessment/Plan:  End stage arthritis, left knee   The patient history, physical examination, clinical judgment of the provider and imaging studies are consistent with end stage degenerative joint disease of the left knee(s) and total knee arthroplasty is deemed medically necessary. The treatment options including medical management, injection therapy arthroscopy and arthroplasty were discussed at length. The risks and benefits of total knee arthroplasty were presented and reviewed. The risks due to aseptic loosening, infection, stiffness, patella tracking problems, thromboembolic complications and other imponderables were discussed. The patient acknowledged the explanation, agreed to proceed with the plan and consent was signed. Patient is being admitted for inpatient treatment for surgery, pain control, PT, OT, prophylactic antibiotics, VTE prophylaxis, progressive ambulation and ADL's and discharge planning. The patient is planning to be discharged home.     [place] Pugh Josseline Reddin   PA-C  03/06/2018, 1:32 PM

## 2018-03-08 ENCOUNTER — Telehealth: Payer: Self-pay

## 2018-03-08 DIAGNOSIS — K9423 Gastrostomy malfunction: Secondary | ICD-10-CM | POA: Diagnosis not present

## 2018-03-08 DIAGNOSIS — K289 Gastrojejunal ulcer, unspecified as acute or chronic, without hemorrhage or perforation: Secondary | ICD-10-CM | POA: Diagnosis not present

## 2018-03-08 DIAGNOSIS — Z9884 Bariatric surgery status: Secondary | ICD-10-CM | POA: Diagnosis not present

## 2018-03-08 NOTE — Telephone Encounter (Signed)
Copied from [place] [id number]. Topic: Inquiry >> Mar 08, 2018  9:48 AM Pricilla Handler wrote: Reason for CRM: Chesley Mires with [facility] [phone]) called wanting to let Dr. Tamala Julian know that the patient has had a fall, but she is not injured.      Thank You!!!

## 2018-03-08 NOTE — Telephone Encounter (Signed)
Copied from [place] [id number]. Topic: Inquiry >> Mar 08, 2018  9:55 AM Pricilla Handler wrote: Reason for CRM: Patient wants her lab results. Please call patient.       Thank You!!!

## 2018-03-08 NOTE — Telephone Encounter (Signed)
Release labs  Thank you

## 2018-03-09 MED ORDER — TRANEXAMIC ACID 1000 MG/10ML IV SOLN
1000.0000 mg | INTRAVENOUS | Status: AC
  Administered 2018-03-10: 1000 mg via INTRAVENOUS
  Filled 2018-03-09: qty 1100

## 2018-03-10 ENCOUNTER — Inpatient Hospital Stay (HOSPITAL_COMMUNITY): Payer: 59 | Admitting: Anesthesiology

## 2018-03-10 ENCOUNTER — Other Ambulatory Visit: Payer: Self-pay

## 2018-03-10 ENCOUNTER — Encounter (HOSPITAL_COMMUNITY)
Admission: RE | Disposition: A | Discharge status: Home / Self Care | Payer: Self-pay | Source: Home / Self Care | Attending: Orthopedic Surgery

## 2018-03-10 ENCOUNTER — Inpatient Hospital Stay (HOSPITAL_COMMUNITY)
Admission: RE | Admit: 2018-03-10 | Discharge: 2018-03-15 | DRG: 470 | Disposition: A | Discharge status: Home / Self Care | Payer: 59 | Attending: Orthopedic Surgery | Admitting: Orthopedic Surgery

## 2018-03-10 ENCOUNTER — Encounter (HOSPITAL_COMMUNITY): Payer: Self-pay

## 2018-03-10 DIAGNOSIS — D509 Iron deficiency anemia, unspecified: Secondary | ICD-10-CM | POA: Diagnosis not present

## 2018-03-10 DIAGNOSIS — G8918 Other acute postprocedural pain: Secondary | ICD-10-CM | POA: Diagnosis not present

## 2018-03-10 DIAGNOSIS — Z86010 Personal history of colonic polyps: Secondary | ICD-10-CM

## 2018-03-10 DIAGNOSIS — K219 Gastro-esophageal reflux disease without esophagitis: Secondary | ICD-10-CM | POA: Diagnosis present

## 2018-03-10 DIAGNOSIS — Z79899 Other long term (current) drug therapy: Secondary | ICD-10-CM

## 2018-03-10 DIAGNOSIS — Z87891 Personal history of nicotine dependence: Secondary | ICD-10-CM

## 2018-03-10 DIAGNOSIS — Z885 Allergy status to narcotic agent status: Secondary | ICD-10-CM

## 2018-03-10 DIAGNOSIS — M659 Synovitis and tenosynovitis, unspecified: Secondary | ICD-10-CM | POA: Diagnosis present

## 2018-03-10 DIAGNOSIS — M1712 Unilateral primary osteoarthritis, left knee: Secondary | ICD-10-CM | POA: Diagnosis not present

## 2018-03-10 DIAGNOSIS — K9429 Other complications of gastrostomy: Secondary | ICD-10-CM

## 2018-03-10 DIAGNOSIS — Z880 Allergy status to penicillin: Secondary | ICD-10-CM

## 2018-03-10 DIAGNOSIS — R1310 Dysphagia, unspecified: Secondary | ICD-10-CM | POA: Diagnosis present

## 2018-03-10 DIAGNOSIS — Z96652 Presence of left artificial knee joint: Secondary | ICD-10-CM

## 2018-03-10 DIAGNOSIS — F329 Major depressive disorder, single episode, unspecified: Secondary | ICD-10-CM | POA: Diagnosis present

## 2018-03-10 DIAGNOSIS — R112 Nausea with vomiting, unspecified: Secondary | ICD-10-CM | POA: Diagnosis not present

## 2018-03-10 DIAGNOSIS — Z91048 Other nonmedicinal substance allergy status: Secondary | ICD-10-CM

## 2018-03-10 DIAGNOSIS — Z8711 Personal history of peptic ulcer disease: Secondary | ICD-10-CM

## 2018-03-10 DIAGNOSIS — D62 Acute posthemorrhagic anemia: Secondary | ICD-10-CM | POA: Diagnosis not present

## 2018-03-10 DIAGNOSIS — K9423 Gastrostomy malfunction: Secondary | ICD-10-CM

## 2018-03-10 DIAGNOSIS — Z888 Allergy status to other drugs, medicaments and biological substances status: Secondary | ICD-10-CM

## 2018-03-10 DIAGNOSIS — Z96659 Presence of unspecified artificial knee joint: Secondary | ICD-10-CM

## 2018-03-10 HISTORY — PX: TOTAL KNEE ARTHROPLASTY: SHX125

## 2018-03-10 LAB — CBC
HCT: 29.3 % — ABNORMAL LOW (ref 36.0–46.0)
Hemoglobin: 8.9 g/dL — ABNORMAL LOW (ref 12.0–15.0)
MCH: 23.7 pg — AB (ref 26.0–34.0)
MCHC: 30.4 g/dL (ref 30.0–36.0)
MCV: 78.1 fL (ref 78.0–100.0)
PLATELETS: 365 10*3/uL (ref 150–400)
RBC: 3.75 MIL/uL — ABNORMAL LOW (ref 3.87–5.11)
RDW: 15.7 % — AB (ref 11.5–15.5)
WBC: 10.5 10*3/uL (ref 4.0–10.5)

## 2018-03-10 LAB — GLUCOSE, CAPILLARY: Glucose-Capillary: 91 mg/dL (ref 65–99)

## 2018-03-10 LAB — CREATININE, SERUM
Creatinine, Ser: 0.66 mg/dL (ref 0.44–1.00)
GFR calc Af Amer: >60 mL/min (ref >60)
GFR calc non Af Amer: >60 mL/min (ref >60)

## 2018-03-10 SURGERY — ARTHROPLASTY, KNEE, TOTAL
Anesthesia: Spinal | Site: Knee | Laterality: Left

## 2018-03-10 MED ORDER — KETOROLAC TROMETHAMINE 30 MG/ML IJ SOLN
INTRAMUSCULAR | Status: AC
  Filled 2018-03-10: qty 1

## 2018-03-10 MED ORDER — VITAL HIGH PROTEIN PO LIQD
1000.0000 mL | ORAL | Status: DC
  Filled 2018-03-10 (×2): qty 1000

## 2018-03-10 MED ORDER — LINACLOTIDE 145 MCG PO CAPS
290.0000 ug | ORAL_CAPSULE | Freq: Every day | ORAL | Status: DC
  Administered 2018-03-11 – 2018-03-15 (×5): 290 ug via ORAL
  Filled 2018-03-10 (×5): qty 2

## 2018-03-10 MED ORDER — TRANEXAMIC ACID 1000 MG/10ML IV SOLN
1000.0000 mg | Freq: Once | INTRAVENOUS | Status: AC
  Administered 2018-03-10: 1000 mg via INTRAVENOUS
  Filled 2018-03-10: qty 1100

## 2018-03-10 MED ORDER — DEXAMETHASONE SODIUM PHOSPHATE 10 MG/ML IJ SOLN
10.0000 mg | Freq: Once | INTRAMUSCULAR | Status: AC
  Administered 2018-03-10: 10 mg via INTRAVENOUS

## 2018-03-10 MED ORDER — HYDROCODONE-ACETAMINOPHEN 5-325 MG PO TABS: 1.0000 | ORAL_TABLET | ORAL | Status: DC | PRN

## 2018-03-10 MED ORDER — ENOXAPARIN SODIUM 40 MG/0.4ML ~~LOC~~ SOLN
40.0000 mg | SUBCUTANEOUS | Status: DC
  Administered 2018-03-11 – 2018-03-15 (×5): 40 mg via SUBCUTANEOUS
  Filled 2018-03-10 (×5): qty 0.4

## 2018-03-10 MED ORDER — ALBUTEROL SULFATE (2.5 MG/3ML) 0.083% IN NEBU: 2.5000 mg | INHALATION_SOLUTION | Freq: Four times a day (QID) | RESPIRATORY_TRACT | Status: DC | PRN

## 2018-03-10 MED ORDER — FENTANYL CITRATE (PF) 100 MCG/2ML IJ SOLN
INTRAMUSCULAR | Status: DC | PRN
  Administered 2018-03-10: 50 ug via INTRAVENOUS

## 2018-03-10 MED ORDER — HYDROCODONE-ACETAMINOPHEN 7.5-325 MG PO TABS
1.0000 | ORAL_TABLET | ORAL | Status: DC | PRN
  Administered 2018-03-10: 2 via ORAL
  Administered 2018-03-10: 1 via ORAL
  Administered 2018-03-10 – 2018-03-13 (×12): 2 via ORAL
  Filled 2018-03-10 (×4): qty 2
  Filled 2018-03-10: qty 1
  Filled 2018-03-10 (×10): qty 2

## 2018-03-10 MED ORDER — METOCLOPRAMIDE HCL 5 MG/ML IJ SOLN
5.0000 mg | Freq: Three times a day (TID) | INTRAMUSCULAR | Status: DC | PRN
  Administered 2018-03-12 – 2018-03-13 (×2): 10 mg via INTRAVENOUS
  Filled 2018-03-10 (×2): qty 2

## 2018-03-10 MED ORDER — HYDROMORPHONE HCL 1 MG/ML IJ SOLN
0.5000 mg | INTRAMUSCULAR | Status: DC | PRN
  Administered 2018-03-10: 0.5 mg via INTRAVENOUS
  Administered 2018-03-11 – 2018-03-12 (×5): 1 mg via INTRAVENOUS
  Administered 2018-03-12: 2 mg via INTRAVENOUS
  Administered 2018-03-12: 1 mg via INTRAVENOUS
  Administered 2018-03-12: 2 mg via INTRAVENOUS
  Administered 2018-03-12: 1 mg via INTRAVENOUS
  Administered 2018-03-13: 2 mg via INTRAVENOUS
  Administered 2018-03-13: 1 mg via INTRAVENOUS
  Administered 2018-03-13 – 2018-03-14 (×4): 2 mg via INTRAVENOUS
  Administered 2018-03-15: 1 mg via INTRAVENOUS
  Administered 2018-03-15: 0.5 mg via INTRAVENOUS
  Filled 2018-03-10: qty 1
  Filled 2018-03-10: qty 2
  Filled 2018-03-10: qty 1
  Filled 2018-03-10: qty 2
  Filled 2018-03-10 (×3): qty 1
  Filled 2018-03-10 (×2): qty 2
  Filled 2018-03-10: qty 1
  Filled 2018-03-10 (×2): qty 2
  Filled 2018-03-10 (×2): qty 1
  Filled 2018-03-10: qty 2
  Filled 2018-03-10 (×3): qty 1
  Filled 2018-03-10: qty 2

## 2018-03-10 MED ORDER — ALUM & MAG HYDROXIDE-SIMETH 200-200-20 MG/5ML PO SUSP: 15.0000 mL | ORAL | Status: DC | PRN

## 2018-03-10 MED ORDER — SODIUM CHLORIDE 0.9 % IR SOLN
Status: DC | PRN
  Administered 2018-03-10: 1000 mL

## 2018-03-10 MED ORDER — CEFAZOLIN SODIUM-DEXTROSE 2-4 GM/100ML-% IV SOLN
2.0000 g | Freq: Four times a day (QID) | INTRAVENOUS | Status: AC
  Administered 2018-03-10 (×2): 2 g via INTRAVENOUS
  Filled 2018-03-10 (×4): qty 100

## 2018-03-10 MED ORDER — KETOROLAC TROMETHAMINE 30 MG/ML IJ SOLN
INTRAMUSCULAR | Status: DC | PRN
  Administered 2018-03-10: 30 mg

## 2018-03-10 MED ORDER — MAGNESIUM CITRATE PO SOLN: 1.0000 | Freq: Once | ORAL | Status: DC | PRN

## 2018-03-10 MED ORDER — BISACODYL 10 MG RE SUPP
10.0000 mg | Freq: Every day | RECTAL | Status: DC | PRN
  Administered 2018-03-12: 10 mg via RECTAL
  Filled 2018-03-10: qty 1

## 2018-03-10 MED ORDER — PROPOFOL 500 MG/50ML IV EMUL
INTRAVENOUS | Status: DC | PRN
  Administered 2018-03-10: 75 ug/kg/min via INTRAVENOUS

## 2018-03-10 MED ORDER — METHOCARBAMOL 500 MG PO TABS
500.0000 mg | ORAL_TABLET | Freq: Four times a day (QID) | ORAL | Status: DC | PRN
  Administered 2018-03-10 – 2018-03-15 (×12): 500 mg via ORAL
  Filled 2018-03-10 (×12): qty 1

## 2018-03-10 MED ORDER — ONDANSETRON HCL 4 MG/2ML IJ SOLN
4.0000 mg | Freq: Four times a day (QID) | INTRAMUSCULAR | Status: DC | PRN
  Administered 2018-03-12 – 2018-03-14 (×2): 4 mg via INTRAVENOUS
  Filled 2018-03-10 (×2): qty 2

## 2018-03-10 MED ORDER — FLUOXETINE HCL 20 MG PO CAPS
20.0000 mg | ORAL_CAPSULE | Freq: Every day | ORAL | Status: DC
  Administered 2018-03-10 – 2018-03-15 (×6): 20 mg via ORAL
  Filled 2018-03-10 (×6): qty 1

## 2018-03-10 MED ORDER — PANTOPRAZOLE SODIUM 40 MG PO TBEC
40.0000 mg | DELAYED_RELEASE_TABLET | Freq: Two times a day (BID) | ORAL | Status: DC
  Administered 2018-03-10 – 2018-03-15 (×11): 40 mg via ORAL
  Filled 2018-03-10 (×11): qty 1

## 2018-03-10 MED ORDER — SODIUM CHLORIDE 0.9 % IV SOLN
INTRAVENOUS | Status: DC
  Administered 2018-03-10 – 2018-03-12 (×3): via INTRAVENOUS

## 2018-03-10 MED ORDER — BUPIVACAINE IN DEXTROSE 0.75-8.25 % IT SOLN
INTRATHECAL | Status: DC | PRN
  Administered 2018-03-10: 1.8 mL via INTRATHECAL

## 2018-03-10 MED ORDER — SODIUM CHLORIDE 0.9 % IJ SOLN
INTRAMUSCULAR | Status: DC | PRN
  Administered 2018-03-10: 30 mL

## 2018-03-10 MED ORDER — VANCOMYCIN HCL IN DEXTROSE 1-5 GM/200ML-% IV SOLN
INTRAVENOUS | Status: AC
  Filled 2018-03-10: qty 200

## 2018-03-10 MED ORDER — MIDAZOLAM HCL 2 MG/2ML IJ SOLN
INTRAMUSCULAR | Status: AC
  Filled 2018-03-10: qty 2

## 2018-03-10 MED ORDER — METOCLOPRAMIDE HCL 5 MG PO TABS: 5.0000 mg | ORAL_TABLET | Freq: Three times a day (TID) | ORAL | Status: DC | PRN

## 2018-03-10 MED ORDER — ONDANSETRON HCL 4 MG PO TABS
4.0000 mg | ORAL_TABLET | Freq: Four times a day (QID) | ORAL | Status: DC | PRN
  Administered 2018-03-11 – 2018-03-12 (×2): 4 mg via ORAL
  Filled 2018-03-10 (×2): qty 1

## 2018-03-10 MED ORDER — FENTANYL CITRATE (PF) 100 MCG/2ML IJ SOLN: 25.0000 ug | INTRAMUSCULAR | Status: DC | PRN

## 2018-03-10 MED ORDER — FENTANYL CITRATE (PF) 100 MCG/2ML IJ SOLN
INTRAMUSCULAR | Status: AC
Start: 2018-03-10 — End: ?
  Filled 2018-03-10: qty 2

## 2018-03-10 MED ORDER — PHENYLEPHRINE 40 MCG/ML (10ML) SYRINGE FOR IV PUSH (FOR BLOOD PRESSURE SUPPORT)
PREFILLED_SYRINGE | INTRAVENOUS | Status: DC | PRN
  Administered 2018-03-10: 80 ug via INTRAVENOUS

## 2018-03-10 MED ORDER — LACTATED RINGERS IV SOLN
INTRAVENOUS | Status: DC | PRN
  Administered 2018-03-10: 07:00:00 via INTRAVENOUS

## 2018-03-10 MED ORDER — BUPIVACAINE-EPINEPHRINE (PF) 0.25% -1:200000 IJ SOLN
INTRAMUSCULAR | Status: AC
  Filled 2018-03-10: qty 30

## 2018-03-10 MED ORDER — ACETAMINOPHEN 325 MG PO TABS: 325.0000 mg | ORAL_TABLET | Freq: Four times a day (QID) | ORAL | Status: DC | PRN

## 2018-03-10 MED ORDER — SODIUM CHLORIDE 0.9 % IV SOLN
INTRAVENOUS | Status: DC | PRN
  Administered 2018-03-10: 1000 mg via INTRAVENOUS

## 2018-03-10 MED ORDER — PROPOFOL 10 MG/ML IV BOLUS
INTRAVENOUS | Status: DC | PRN
  Administered 2018-03-10: 20 mg via INTRAVENOUS
  Administered 2018-03-10 (×2): 30 mg via INTRAVENOUS
  Administered 2018-03-10: 20 mg via INTRAVENOUS

## 2018-03-10 MED ORDER — DEXAMETHASONE SODIUM PHOSPHATE 10 MG/ML IJ SOLN
10.0000 mg | Freq: Once | INTRAMUSCULAR | Status: AC
  Administered 2018-03-11: 10 mg via INTRAVENOUS
  Filled 2018-03-10: qty 1

## 2018-03-10 MED ORDER — PHENOL 1.4 % MT LIQD
1.0000 | OROMUCOSAL | Status: DC | PRN
  Filled 2018-03-10: qty 177

## 2018-03-10 MED ORDER — ONDANSETRON HCL 4 MG/2ML IJ SOLN
INTRAMUSCULAR | Status: DC | PRN
  Administered 2018-03-10: 4 mg via INTRAVENOUS

## 2018-03-10 MED ORDER — PROMETHAZINE HCL 25 MG/ML IJ SOLN: 6.2500 mg | INTRAMUSCULAR | Status: DC | PRN

## 2018-03-10 MED ORDER — ROPIVACAINE HCL 7.5 MG/ML IJ SOLN
INTRAMUSCULAR | Status: DC | PRN
  Administered 2018-03-10: 20 mL via PERINEURAL

## 2018-03-10 MED ORDER — SCOPOLAMINE 1 MG/3DAYS TD PT72
MEDICATED_PATCH | TRANSDERMAL | Status: DC | PRN
  Administered 2018-03-10: 1 via TRANSDERMAL

## 2018-03-10 MED ORDER — SCOPOLAMINE 1 MG/3DAYS TD PT72
MEDICATED_PATCH | TRANSDERMAL | Status: AC
  Filled 2018-03-10: qty 1

## 2018-03-10 MED ORDER — CHLORHEXIDINE GLUCONATE 4 % EX LIQD: 60.0000 mL | Freq: Once | CUTANEOUS | Status: DC

## 2018-03-10 MED ORDER — PROPOFOL 10 MG/ML IV BOLUS
INTRAVENOUS | Status: AC
  Filled 2018-03-10: qty 60

## 2018-03-10 MED ORDER — MIDAZOLAM HCL 5 MG/5ML IJ SOLN
INTRAMUSCULAR | Status: DC | PRN
  Administered 2018-03-10 (×2): 1 mg via INTRAVENOUS

## 2018-03-10 MED ORDER — FERROUS SULFATE 325 (65 FE) MG PO TABS
325.0000 mg | ORAL_TABLET | Freq: Three times a day (TID) | ORAL | Status: DC
  Administered 2018-03-10 – 2018-03-15 (×14): 325 mg via ORAL
  Filled 2018-03-10 (×14): qty 1

## 2018-03-10 MED ORDER — DIPHENHYDRAMINE HCL 12.5 MG/5ML PO ELIX
12.5000 mg | ORAL_SOLUTION | ORAL | Status: DC | PRN
  Administered 2018-03-11 – 2018-03-14 (×3): 25 mg via ORAL
  Filled 2018-03-10 (×3): qty 10

## 2018-03-10 MED ORDER — GABAPENTIN 300 MG PO CAPS
300.0000 mg | ORAL_CAPSULE | Freq: Three times a day (TID) | ORAL | Status: DC
  Administered 2018-03-10 – 2018-03-15 (×16): 300 mg via ORAL
  Filled 2018-03-10 (×16): qty 1

## 2018-03-10 MED ORDER — CEFAZOLIN SODIUM-DEXTROSE 2-4 GM/100ML-% IV SOLN
2.0000 g | INTRAVENOUS | Status: AC
  Administered 2018-03-10: 2 g via INTRAVENOUS
  Filled 2018-03-10: qty 100

## 2018-03-10 MED ORDER — MENTHOL 3 MG MT LOZG: 1.0000 | LOZENGE | OROMUCOSAL | Status: DC | PRN

## 2018-03-10 MED ORDER — SODIUM CHLORIDE 0.9 % IJ SOLN
INTRAMUSCULAR | Status: AC
  Filled 2018-03-10: qty 50

## 2018-03-10 MED ORDER — STERILE WATER FOR IRRIGATION IR SOLN
Status: DC | PRN
  Administered 2018-03-10: 2000 mL

## 2018-03-10 MED ORDER — BUPIVACAINE-EPINEPHRINE (PF) 0.25% -1:200000 IJ SOLN
INTRAMUSCULAR | Status: DC | PRN
  Administered 2018-03-10: 30 mL

## 2018-03-10 MED ORDER — METHOCARBAMOL 1000 MG/10ML IJ SOLN
500.0000 mg | Freq: Four times a day (QID) | INTRAVENOUS | Status: DC | PRN
  Filled 2018-03-10: qty 5

## 2018-03-10 MED ORDER — DOCUSATE SODIUM 100 MG PO CAPS
100.0000 mg | ORAL_CAPSULE | Freq: Two times a day (BID) | ORAL | Status: DC
  Administered 2018-03-10 – 2018-03-15 (×10): 100 mg via ORAL
  Filled 2018-03-10 (×10): qty 1

## 2018-03-10 MED ORDER — POLYETHYLENE GLYCOL 3350 17 G PO PACK
17.0000 g | PACK | Freq: Two times a day (BID) | ORAL | Status: DC
  Administered 2018-03-10 – 2018-03-15 (×8): 17 g via ORAL
  Filled 2018-03-10 (×9): qty 1

## 2018-03-10 SURGICAL SUPPLY — 51 items
ADH SKN CLS APL DERMABOND .7 (GAUZE/BANDAGES/DRESSINGS) ×1
BAG DECANTER FOR FLEXI CONT (MISCELLANEOUS) IMPLANT
BAG SPEC THK2 15X12 ZIP CLS (MISCELLANEOUS)
BAG ZIPLOCK 12X15 (MISCELLANEOUS) IMPLANT
BANDAGE ACE 6X5 VEL STRL LF (GAUZE/BANDAGES/DRESSINGS) ×3 IMPLANT
BLADE SAW SGTL 11.0X1.19X90.0M (BLADE) IMPLANT
BLADE SAW SGTL 13.0X1.19X90.0M (BLADE) ×3 IMPLANT
BOWL SMART MIX CTS (DISPOSABLE) ×3 IMPLANT
CAPT KNEE TOTAL 3 ATTUNE ×2 IMPLANT
CEMENT HV SMART SET (Cement) ×4 IMPLANT
COVER SURGICAL LIGHT HANDLE (MISCELLANEOUS) ×3 IMPLANT
CUFF TOURN SGL QUICK 34 (TOURNIQUET CUFF) ×3
CUFF TRNQT CYL 34X4X40X1 (TOURNIQUET CUFF) ×1 IMPLANT
DECANTER SPIKE VIAL GLASS SM (MISCELLANEOUS) ×3 IMPLANT
DERMABOND ADVANCED (GAUZE/BANDAGES/DRESSINGS) ×2
DERMABOND ADVANCED .7 DNX12 (GAUZE/BANDAGES/DRESSINGS) ×1 IMPLANT
DRAPE U-SHAPE 47X51 STRL (DRAPES) ×3 IMPLANT
DRESSING AQUACEL AG SP 3.5X10 (GAUZE/BANDAGES/DRESSINGS) ×1 IMPLANT
DRSG AQUACEL AG SP 3.5X10 (GAUZE/BANDAGES/DRESSINGS) ×3
DURAPREP 26ML APPLICATOR (WOUND CARE) ×6 IMPLANT
ELECT REM PT RETURN 15FT ADLT (MISCELLANEOUS) ×3 IMPLANT
GLOVE BIOGEL M 7.0 STRL (GLOVE) IMPLANT
GLOVE BIOGEL PI IND STRL 7.5 (GLOVE) ×1 IMPLANT
GLOVE BIOGEL PI IND STRL 8.5 (GLOVE) ×1 IMPLANT
GLOVE BIOGEL PI INDICATOR 7.5 (GLOVE) ×10
GLOVE BIOGEL PI INDICATOR 8.5 (GLOVE) ×2
GLOVE ECLIPSE 8.0 STRL XLNG CF (GLOVE) IMPLANT
GLOVE ORTHO TXT STRL SZ7.5 (GLOVE) ×6 IMPLANT
GOWN STRL REUS W/TWL 2XL LVL3 (GOWN DISPOSABLE) ×3 IMPLANT
GOWN STRL REUS W/TWL LRG LVL3 (GOWN DISPOSABLE) ×3 IMPLANT
HANDPIECE INTERPULSE COAX TIP (DISPOSABLE) ×3
HOLDER FOLEY CATH W/STRAP (MISCELLANEOUS) IMPLANT
MANIFOLD NEPTUNE II (INSTRUMENTS) ×3 IMPLANT
NDL SAFETY ECLIPSE 18X1.5 (NEEDLE) IMPLANT
NEEDLE HYPO 18GX1.5 SHARP (NEEDLE)
PACK TOTAL KNEE CUSTOM (KITS) ×3 IMPLANT
POSITIONER SURGICAL ARM (MISCELLANEOUS) ×3 IMPLANT
SET HNDPC FAN SPRY TIP SCT (DISPOSABLE) ×1 IMPLANT
SET PAD KNEE POSITIONER (MISCELLANEOUS) ×3 IMPLANT
SUT MNCRL AB 4-0 PS2 18 (SUTURE) ×3 IMPLANT
SUT STRATAFIX PDS+ 0 24IN (SUTURE) ×3 IMPLANT
SUT VIC AB 1 CT1 36 (SUTURE) ×3 IMPLANT
SUT VIC AB 2-0 CT1 27 (SUTURE) ×9
SUT VIC AB 2-0 CT1 TAPERPNT 27 (SUTURE) ×3 IMPLANT
SYR 3ML LL SCALE MARK (SYRINGE) IMPLANT
SYR 50ML LL SCALE MARK (SYRINGE) ×3 IMPLANT
TRAY FOLEY CATH 14FR (SET/KITS/TRAYS/PACK) ×2 IMPLANT
TRAY FOLEY MTR SLVR 16FR STAT (SET/KITS/TRAYS/PACK) ×3 IMPLANT
WATER STERILE IRR 1000ML POUR (IV SOLUTION) ×3 IMPLANT
WRAP KNEE MAXI GEL POST OP (GAUZE/BANDAGES/DRESSINGS) ×3 IMPLANT
YANKAUER SUCT BULB TIP 10FT TU (MISCELLANEOUS) ×3 IMPLANT

## 2018-03-10 NOTE — Plan of Care (Signed)
Plan of care 

## 2018-03-10 NOTE — Anesthesia Postprocedure Evaluation (Signed)
Anesthesia Post Note  Patient: [name]  Procedure(s) Performed: LEFT TOTAL KNEE ARTHROPLASTY (Left Knee)     Patient location during evaluation: PACU Anesthesia Type: Spinal Level of consciousness: oriented and awake and alert Pain management: pain level controlled Vital Signs Assessment: post-procedure vital signs reviewed and stable Respiratory status: spontaneous breathing, respiratory function stable and patient connected to nasal cannula oxygen Cardiovascular status: blood pressure returned to baseline and stable Postop Assessment: no headache, no backache, no apparent nausea or vomiting and patient able to bend at knees Anesthetic complications: no    Last Vitals:  Vitals:   03/10/18 1157 03/10/18 1258  BP: 111/76 109/71  Pulse: 64 64  Resp: 14 14  Temp: (!) 36.4 C 36.7 C  SpO2: 100% 100%    Last Pain:  Vitals:   03/10/18 1401  TempSrc:   PainSc: [place]

## 2018-03-10 NOTE — Op Note (Signed)
NAME:  [name]                      MEDICAL RECORD NO.:  [id number]                             FACILITY:  [facility]      PHYSICIAN:  Pietro Cassis. Alvan Dame, M.D.  DATE OF BIRTH:  April 17, 1976      DATE OF PROCEDURE:  03/10/2018                                     OPERATIVE REPORT         PREOPERATIVE DIAGNOSIS:  Left knee osteoarthritis.      POSTOPERATIVE DIAGNOSIS:  Left knee osteoarthritis.      FINDINGS:  The patient was noted to have complete loss of cartilage and   bone-on-bone arthritis with associated osteophytes in the medial and patellofemoral compartments of   the knee with a significant synovitis and associated effusion.  The patient had failed months of conservative treatment including medications, injection therapy, activity modification.     PROCEDURE:  Left total knee replacement.      COMPONENTS USED:  DePuy Attune rotating platform posterior stabilized knee   system, a size 5 femur, 5 tibia, size 8 mm PS AOX insert, and 35 anatomic patellar   button.      SURGEON:  Pietro Cassis. Alvan Dame, M.D.      ASSISTANT:  Danae Orleans, PA-C.      ANESTHESIA:  Regional and Spinal.      SPECIMENS:  None.      COMPLICATION:  None.      DRAINS:  None.  EBL: <100cc      TOURNIQUET TIME:   Total Tourniquet Time Documented: Thigh (Left) - 26 minutes Total: Thigh (Left) - 26 minutes  .      The patient was stable to the recovery room.      INDICATION FOR PROCEDURE:  [name] is a 42 y.o. female patient of   mine.  The patient had been seen, evaluated, and treated for months conservatively in the   office with medication, activity modification, and injections.  The patient had   radiographic changes of bone-on-bone arthritis with endplate sclerosis and osteophytes noted.  Based on the radiographic changes and failed conservative measures, the patient   decided to proceed with definitive treatment, total knee replacement.  Risks of infection, DVT, component failure,  need for revision surgery, neurovascular injury were reviewed in the office setting.  The postop course was reviewed stressing the efforts to maximize post-operative satisfaction and function.  Consent was obtained for benefit of pain   relief.      PROCEDURE IN DETAIL:  The patient was brought to the operative theater.   Once adequate anesthesia, preoperative antibiotics, 2 gm of Ancef, 1 gm of Vancomycin (MRSA+ screen),1 gm of Tranexamic Acid, and 10 mg of Decadron administered, the patient was positioned supine with a left thigh tourniquet placed.  The  left lower extremity was prepped and draped in sterile fashion.  A time-   out was performed identifying the patient, planned procedure, and the appropriate extremity.      The left lower extremity was placed in the [facility] leg holder.  The leg was   exsanguinated, tourniquet elevated to 250 mmHg.  A  midline incision was   made followed by median parapatellar arthrotomy.  Following initial   exposure, attention was first directed to the patella.  Precut   measurement was noted to be 23 mm.  I resected down to 14 mm and used a   35 anatomic patellar button to restore patellar height as well as cover the cut surface.      The lug holes were drilled and a metal shim was placed to protect the   patella from retractors and saw blade during the procedure.      At this point, attention was now directed to the femur.  The femoral   canal was opened with a drill, irrigated to try to prevent fat emboli.  An   intramedullary rod was passed at 3 degrees valgus, 9 mm of bone was   resected off the distal femur.  Following this resection, the tibia was   subluxated anteriorly.  Using the extramedullary guide, 2 mm of bone was resected off   the proximal medial tibia.  We confirmed the gap would be   stable medially and laterally with a size 6 spacer block as well as confirmed that the tibial cut was perpendicular in the coronal plane, checking with an  alignment rod.      Once this was done, I sized the femur to be a size 5 in the anterior-   posterior dimension, chose a standard component based on medial and   lateral dimension.  The size 5 rotation block was then pinned in   position anterior referenced using the C-clamp to set rotation.  The   anterior, posterior, and  chamfer cuts were made without difficulty nor   notching making certain that I was along the anterior cortex to help   with flexion gap stability.      The final box cut was made off the lateral aspect of distal femur.      At this point, the tibia was sized to be a size 5.  The size 5 tray was   then pinned in position through the medial third of the tubercle,   drilled, and keel punched.  Trial reduction was now carried with a 5 femur,  5 tibia, a size 6 up to the 8 mm PS insert, and the 35 anatomic patella botton.  The knee was brought to full extension with good flexion stability with the patella   tracking through the trochlea without application of pressure.  Given   all these findings the trial components removed.  Final components were   opened and cement was mixed.  The knee was irrigated with normal saline solution and pulse lavage.  The synovial lining was   then injected with 30 cc of 0.25% Marcaine with epinephrine, 1 cc of Toradol and 30 cc of NS for a total of 61 cc.     Final implants were then cemented onto cleaned and dried cut surfaces of bone with the knee brought to extension with a size 8 mm PS trial insert.      Once the cement had fully cured, excess cement was removed   throughout the knee.  I confirmed that I was satisfied with the range of   motion and stability, and the final size 8 mm PS AOX insert was chosen.  It was   placed into the knee.      The tourniquet had been let down at 26 minutes.  No significant   hemostasis was required.  The extensor mechanism was then reapproximated using #1 Vicryl and #1 Stratafix sutures with the knee    in flexion.  The   remaining wound was closed with 2-0 Vicryl and running 4-0 Monocryl.   The knee was cleaned, dried, dressed sterilely using Dermabond and   Aquacel dressing.  The patient was then   brought to recovery room in stable condition, tolerating the procedure   well.   Please note that Physician Assistant, Danae Orleans, PA-C was present for the entirety of the case, and was utilized for pre-operative positioning, peri-operative retractor management, general facilitation of the procedure and for primary wound closure at the end of the case.              Pietro Cassis Alvan Dame, M.D.    03/10/2018 8:44 AM

## 2018-03-10 NOTE — Anesthesia Procedure Notes (Signed)
Spinal  Patient location during procedure: OR Start time: 03/10/2018 7:31 AM End time: 03/10/2018 7:35 AM Reason for block: at surgeon's request Staffing Resident/CRNA: Anne Fu, CRNA Performed: resident/CRNA  Preanesthetic Checklist Completed: patient identified, site marked, surgical consent, pre-op evaluation, timeout performed, IV checked, risks and benefits discussed and monitors and equipment checked Spinal Block Patient position: sitting Prep: DuraPrep Patient monitoring: heart rate, continuous pulse ox and blood pressure Approach: right paramedian Location: L2-3 Injection technique: single-shot Needle Needle type: Pencan  Needle gauge: 24 G Needle length: 9 cm Assessment Sensory level: T6 Additional Notes Expiration date of kit checked and confirmed. Patient tolerated procedure well, without complications. X 1 attempt with noted clear CSF return. Loss of motor and sensory on exam post injection.

## 2018-03-10 NOTE — Evaluation (Signed)
Physical Therapy Evaluation Patient Details Name: [name] MRN: [id number] DOB: 12-12-1975 Today's Date: 03/10/2018   History of Present Illness  Pt s/p L TKR and with hx of feeding tube  Clinical Impression  Pt s/p L TKR and presents with decreased L LE strength/ROM and post op pain limiting functional mobility.  Pt should progress to dc home with family assist.    Follow Up Recommendations Home health PT    Equipment Recommendations  None recommended by PT    Recommendations for Other Services OT consult     Precautions / Restrictions Precautions Precautions: Fall Restrictions Weight Bearing Restrictions: No Other Position/Activity Restrictions: WBAT      Mobility  Bed Mobility Overal bed mobility: Needs Assistance Bed Mobility: Supine to Sit     Supine to sit: Min assist     General bed mobility comments: cues for sequence and use of R LE to self assist  Transfers Overall transfer level: Needs assistance Equipment used: Rolling walker (2 wheeled) Transfers: Sit to/from Stand Sit to Stand: Min assist         General transfer comment: cues for LE management and use of UEs to self assist  Ambulation/Gait Ambulation/Gait assistance: Min assist Gait Distance (Feet): 45 Feet Assistive device: Rolling walker (2 wheeled) Gait Pattern/deviations: Step-to pattern;Decreased step length - right;Decreased step length - left;Shuffle;Trunk flexed Gait velocity: decr   General Gait Details: cues for sequence, posture, position from RW and to extend L knee/hip  Stairs            Wheelchair Mobility    Modified Rankin (Stroke Patients Only)       Balance                                             Pertinent Vitals/Pain Pain Assessment: 0-10 Pain Score: 6  Pain Location: L knee Pain Descriptors / Indicators: Sore;Aching Pain Intervention(s): Limited activity within patient's tolerance;Monitored during session;Premedicated  before session;Ice applied    Home Living Family/patient expects to be discharged to:: Private residence Living Arrangements: Children;Parent Available Help at Discharge: Family;Friend(s) Type of Home: Apartment Home Access: Stairs to enter   Entrance Stairs-Number of Steps: 1+1 Home Layout: One level Home Equipment: [place] - 2 wheels;Cane - single point      Prior Function Level of Independence: Independent               Hand Dominance        Extremity/Trunk Assessment   Upper Extremity Assessment Upper Extremity Assessment: Overall WFL for tasks assessed    Lower Extremity Assessment Lower Extremity Assessment: LLE deficits/detail    Cervical / Trunk Assessment Cervical / Trunk Assessment: Normal  Communication   Communication: No difficulties  Cognition Arousal/Alertness: Awake/alert Behavior During Therapy: WFL for tasks assessed/performed Overall Cognitive Status: Within Functional Limits for tasks assessed                                        General Comments      Exercises Total Joint Exercises Ankle Circles/Pumps: AROM;Both;20 reps;Supine   Assessment/Plan    PT Assessment Patient needs continued PT services  PT Problem List Decreased strength;Decreased range of motion;Decreased activity tolerance;Decreased mobility;Pain;Decreased knowledge of use of DME       PT Treatment Interventions  DME instruction;Gait training;Stair training;Functional mobility training;Therapeutic activities;Therapeutic exercise;Patient/family education    PT Goals (Current goals can be found in the Care Plan section)  Acute Rehab PT Goals Patient Stated Goal: Home with children PT Goal Formulation: With patient Time For Goal Achievement: 03/17/18 Potential to Achieve Goals: Good    Frequency 7X/week   Barriers to discharge        Co-evaluation               AM-PAC PT "6 Clicks" Daily Activity  Outcome Measure Difficulty turning  over in bed (including adjusting bedclothes, sheets and blankets)?: A Lot Difficulty moving from lying on back to sitting on the side of the bed? : Unable Difficulty sitting down on and standing up from a chair with arms (e.g., wheelchair, bedside commode, etc,.)?: Unable Help needed moving to and from a bed to chair (including a wheelchair)?: A Little Help needed walking in hospital room?: A Little Help needed climbing 3-5 steps with a railing? : A Little 6 Click Score: 13    End of Session Equipment Utilized During Treatment: Gait belt Activity Tolerance: Patient tolerated treatment well Patient left: in chair;with call bell/phone within reach Nurse Communication: Mobility status PT Visit Diagnosis: Difficulty in walking, not elsewhere classified (R26.2)    Time: 0300-9233 PT Time Calculation (min) (ACUTE ONLY): 28 min   Charges:   PT Evaluation $PT Eval Low Complexity: 1 Low PT Treatments $Gait Training: 8-22 mins   PT G Codes:        Pg [phone]   [name] 03/10/2018, 5:40 PM

## 2018-03-10 NOTE — Interval H&P Note (Signed)
History and Physical Interval Note:  03/10/2018 7:11 AM  [name]  has presented today for surgery, with the diagnosis of Left knee osteoarthritis  The various methods of treatment have been discussed with the patient and family. After consideration of risks, benefits and other options for treatment, the patient has consented to  Procedure(s) with comments: LEFT TOTAL KNEE ARTHROPLASTY (Left) - 70 mins as a surgical intervention .  The patient's history has been reviewed, patient examined, no change in status, stable for surgery.  I have reviewed the patient's chart and labs.  Questions were answered to the patient's satisfaction.     Mauri Pole

## 2018-03-10 NOTE — Discharge Instructions (Signed)

## 2018-03-10 NOTE — Telephone Encounter (Signed)
Noted. No further action warranted.

## 2018-03-10 NOTE — Anesthesia Procedure Notes (Signed)
Anesthesia Regional Block: Adductor canal block   Pre-Anesthetic Checklist: ,, timeout performed, Correct Patient, Correct Site, Correct Laterality, Correct Procedure, Correct Position, site marked, Risks and benefits discussed,  Surgical consent,  Pre-op evaluation,  At surgeon's request and post-op pain management  Laterality: Left  Prep: chloraprep       Needles:  Injection technique: Single-shot  Needle Type: Echogenic Needle     Needle Length: 9cm  Needle Gauge: 21     Additional Needles:   Procedures:,,,, ultrasound used (permanent image in chart),,,,  Narrative:  Start time: 03/10/2018 7:00 AM End time: 03/10/2018 7:05 AM Injection made incrementally with aspirations every 5 mL.  Performed by: Personally  Anesthesiologist: Catalina Gravel, MD  Additional Notes: No pain on injection. No increased resistance to injection. Injection made in 5cc increments.  Good needle visualization.  Patient tolerated procedure well.

## 2018-03-10 NOTE — Anesthesia Preprocedure Evaluation (Addendum)
Anesthesia Evaluation  Patient identified by MRN, date of birth, ID band Patient awake    Reviewed: Allergy & Precautions, NPO status , Patient's Chart, lab work & pertinent test results  History of Anesthesia Complications (+) PONV and history of anesthetic complications  Airway Mallampati: II  TM Distance: >3 FB Neck ROM: Full    Dental  (+) Dental Advisory Given, Partial Lower, Partial Upper, Chipped   Pulmonary asthma , former smoker,    Pulmonary exam normal breath sounds clear to auscultation       Cardiovascular negative cardio ROS Normal cardiovascular exam Rhythm:Regular Rate:Normal     Neuro/Psych  Headaches, PSYCHIATRIC DISORDERS Anxiety Depression    GI/Hepatic Neg liver ROS, hiatal hernia, PUD, GERD  Medicated,UC S/p gastric sleeve   Endo/Other  negative endocrine ROS  Renal/GU negative Renal ROS     Musculoskeletal  (+) Arthritis , Osteoarthritis,    Abdominal   Peds  Hematology  (+) Blood dyscrasia, anemia , Plt 411k   Anesthesia Other Findings Day of surgery medications reviewed with the patient.  Reproductive/Obstetrics                            Anesthesia Physical Anesthesia Plan  ASA: II  Anesthesia Plan: Spinal   Post-op Pain Management:  Regional for Post-op pain   Induction:   PONV Risk Score and Plan: 3 and Midazolam, Propofol infusion, Treatment may vary due to age or medical condition, Scopolamine patch - Pre-op, Ondansetron and Dexamethasone  Airway Management Planned: Natural Airway and Simple Face Mask  Additional Equipment:   Intra-op Plan:   Post-operative Plan:   Informed Consent: I have reviewed the patients History and Physical, chart, labs and discussed the procedure including the risks, benefits and alternatives for the proposed anesthesia with the patient or authorized representative who has indicated his/her understanding and acceptance.    Dental advisory given  Plan Discussed with: CRNA, Anesthesiologist and Surgeon  Anesthesia Plan Comments: (Discussed risks and benefits of and differences between spinal and general. Discussed risks of spinal including headache, backache, failure, bleeding, infection, and nerve damage. Patient consents to spinal. Questions answered. Coagulation studies and platelet count acceptable.)        Anesthesia Quick Evaluation

## 2018-03-10 NOTE — Transfer of Care (Signed)
Immediate Anesthesia Transfer of Care Note  Patient: [name]  Procedure(s) Performed: Procedure(s) with comments: LEFT TOTAL KNEE ARTHROPLASTY (Left) - 70 mins  Patient Location: PACU  Anesthesia Type:Spinal  Level of Consciousness:  sedated, patient cooperative and responds to stimulation  Airway & Oxygen Therapy:Patient Spontanous Breathing and Patient connected to face mask oxgen  Post-op Assessment:  Report given to PACU RN and Post -op Vital signs reviewed and stable  Post vital signs:  Reviewed and stable  Last Vitals:  Vitals:   03/10/18 0612  BP: 120/79  Pulse: 65  Resp: 16  Temp: 36.8 C  SpO2: 356%    Complications: No apparent anesthesia complications

## 2018-03-11 ENCOUNTER — Encounter (HOSPITAL_COMMUNITY): Payer: Self-pay | Admitting: Orthopedic Surgery

## 2018-03-11 DIAGNOSIS — K9413 Enterostomy malfunction: Secondary | ICD-10-CM | POA: Diagnosis not present

## 2018-03-11 DIAGNOSIS — Z885 Allergy status to narcotic agent status: Secondary | ICD-10-CM | POA: Diagnosis not present

## 2018-03-11 DIAGNOSIS — R1310 Dysphagia, unspecified: Secondary | ICD-10-CM | POA: Diagnosis present

## 2018-03-11 DIAGNOSIS — F329 Major depressive disorder, single episode, unspecified: Secondary | ICD-10-CM | POA: Diagnosis present

## 2018-03-11 DIAGNOSIS — Z8711 Personal history of peptic ulcer disease: Secondary | ICD-10-CM | POA: Diagnosis not present

## 2018-03-11 DIAGNOSIS — Z888 Allergy status to other drugs, medicaments and biological substances status: Secondary | ICD-10-CM | POA: Diagnosis not present

## 2018-03-11 DIAGNOSIS — M1712 Unilateral primary osteoarthritis, left knee: Secondary | ICD-10-CM | POA: Diagnosis not present

## 2018-03-11 DIAGNOSIS — Z87891 Personal history of nicotine dependence: Secondary | ICD-10-CM | POA: Diagnosis not present

## 2018-03-11 DIAGNOSIS — K9420 Gastrostomy complication, unspecified: Secondary | ICD-10-CM | POA: Diagnosis not present

## 2018-03-11 DIAGNOSIS — Z91048 Other nonmedicinal substance allergy status: Secondary | ICD-10-CM | POA: Diagnosis not present

## 2018-03-11 DIAGNOSIS — K219 Gastro-esophageal reflux disease without esophagitis: Secondary | ICD-10-CM | POA: Diagnosis present

## 2018-03-11 DIAGNOSIS — M659 Synovitis and tenosynovitis, unspecified: Secondary | ICD-10-CM | POA: Diagnosis present

## 2018-03-11 DIAGNOSIS — Z79899 Other long term (current) drug therapy: Secondary | ICD-10-CM | POA: Diagnosis not present

## 2018-03-11 DIAGNOSIS — Z86010 Personal history of colonic polyps: Secondary | ICD-10-CM | POA: Diagnosis not present

## 2018-03-11 DIAGNOSIS — M25562 Pain in left knee: Secondary | ICD-10-CM | POA: Diagnosis not present

## 2018-03-11 DIAGNOSIS — D62 Acute posthemorrhagic anemia: Secondary | ICD-10-CM | POA: Diagnosis not present

## 2018-03-11 DIAGNOSIS — K9423 Gastrostomy malfunction: Secondary | ICD-10-CM | POA: Diagnosis present

## 2018-03-11 DIAGNOSIS — Z880 Allergy status to penicillin: Secondary | ICD-10-CM | POA: Diagnosis not present

## 2018-03-11 LAB — BASIC METABOLIC PANEL
Anion gap: 2 — ABNORMAL LOW (ref 5–15)
BUN: 15 mg/dL (ref 6–20)
CO2: 27 mmol/L (ref 22–32)
CREATININE: 0.65 mg/dL (ref 0.44–1.00)
Calcium: 8.3 mg/dL — ABNORMAL LOW (ref 8.9–10.3)
Chloride: 109 mmol/L (ref 101–111)
GFR calc Af Amer: >60 mL/min (ref >60)
GLUCOSE: 83 mg/dL (ref 65–99)
Potassium: 4.2 mmol/L (ref 3.5–5.1)
SODIUM: 138 mmol/L (ref 135–145)

## 2018-03-11 LAB — GLUCOSE, CAPILLARY
GLUCOSE-CAPILLARY: 102 mg/dL — AB (ref 65–99)
GLUCOSE-CAPILLARY: 103 mg/dL — AB (ref 65–99)
GLUCOSE-CAPILLARY: 116 mg/dL — AB (ref 65–99)
Glucose-Capillary: 78 mg/dL (ref 65–99)
Glucose-Capillary: 87 mg/dL (ref 65–99)

## 2018-03-11 LAB — CBC
HCT: 23.8 % — ABNORMAL LOW (ref 36.0–46.0)
Hemoglobin: 7.2 g/dL — ABNORMAL LOW (ref 12.0–15.0)
MCH: 23.7 pg — AB (ref 26.0–34.0)
MCHC: 30.3 g/dL (ref 30.0–36.0)
MCV: 78.3 fL (ref 78.0–100.0)
PLATELETS: 330 10*3/uL (ref 150–400)
RBC: 3.04 MIL/uL — ABNORMAL LOW (ref 3.87–5.11)
RDW: 15.7 % — AB (ref 11.5–15.5)
WBC: 10.2 10*3/uL (ref 4.0–10.5)

## 2018-03-11 LAB — PREPARE RBC (CROSSMATCH)

## 2018-03-11 MED ORDER — FERROUS SULFATE 325 (65 FE) MG PO TABS
325.0000 mg | ORAL_TABLET | Freq: Three times a day (TID) | ORAL | 3 refills | Status: DC
Start: 2018-03-11 — End: 2018-05-24

## 2018-03-11 MED ORDER — DOCUSATE SODIUM 100 MG PO CAPS: 100.0000 mg | ORAL_CAPSULE | Freq: Two times a day (BID) | ORAL | 0 refills | Status: DC

## 2018-03-11 MED ORDER — VITAL HIGH PROTEIN PO LIQD
1000.0000 mL | ORAL | Status: DC
  Administered 2018-03-11 – 2018-03-14 (×3): 1000 mL
  Filled 2018-03-11 (×5): qty 1000

## 2018-03-11 MED ORDER — SODIUM CHLORIDE 0.9 % IV SOLN
Freq: Once | INTRAVENOUS | Status: AC
  Administered 2018-03-11: 09:00:00 via INTRAVENOUS

## 2018-03-11 MED ORDER — HYDROCODONE-ACETAMINOPHEN 7.5-325 MG PO TABS: 1.0000 | ORAL_TABLET | ORAL | 0 refills | Status: DC | PRN

## 2018-03-11 MED ORDER — METHOCARBAMOL 500 MG PO TABS: 500.0000 mg | ORAL_TABLET | Freq: Four times a day (QID) | ORAL | 0 refills | Status: DC | PRN

## 2018-03-11 MED ORDER — ENOXAPARIN SODIUM 40 MG/0.4ML ~~LOC~~ SOLN: 40.0000 mg | SUBCUTANEOUS | 0 refills | Status: DC

## 2018-03-11 MED ORDER — POLYETHYLENE GLYCOL 3350 17 G PO PACK: 17.0000 g | PACK | Freq: Two times a day (BID) | ORAL | 0 refills | Status: DC

## 2018-03-11 NOTE — Progress Notes (Signed)
Physical Therapy Treatment Patient Details Name: [name] MRN: [id number] DOB: 05-15-1976 Today's Date: 03/11/2018    History of Present Illness Pt s/p L TKR and with hx of feeding tube    PT Comments    Pt continues motivated but fatigued and ambulating increased distance this pm and with increased stability noted.  Pt hopeful for dc home tomorrow.   Follow Up Recommendations  Home health PT     Equipment Recommendations  None recommended by PT    Recommendations for Other Services OT consult     Precautions / Restrictions Precautions Precautions: Fall Restrictions Weight Bearing Restrictions: No Other Position/Activity Restrictions: WBAT    Mobility  Bed Mobility Overal bed mobility: Needs Assistance Bed Mobility: Supine to Sit;Sit to Supine     Supine to sit: Min assist Sit to supine: Min assist   General bed mobility comments: cues for sequence and use of R LE to self assist  Transfers Overall transfer level: Needs assistance Equipment used: Rolling walker (2 wheeled) Transfers: Sit to/from Stand Sit to Stand: Min assist;Min guard         General transfer comment: cues for LE management and use of UEs to self assist  Ambulation/Gait Ambulation/Gait assistance: Min assist;Min guard Gait Distance (Feet): 147 Feet Assistive device: Rolling walker (2 wheeled) Gait Pattern/deviations: Step-to pattern;Decreased step length - right;Decreased step length - left;Shuffle;Trunk flexed Gait velocity: decr   General Gait Details: cues for sequence, posture, position from RW and to extend L knee/hip   Stairs             Wheelchair Mobility    Modified Rankin (Stroke Patients Only)       Balance                                            Cognition Arousal/Alertness: Awake/alert Behavior During Therapy: WFL for tasks assessed/performed Overall Cognitive Status: Within Functional Limits for tasks assessed                                        Exercises Total Joint Exercises Ankle Circles/Pumps: AROM;Both;20 reps;Supine Quad Sets: AROM;Both;15 reps;Supine Heel Slides: AAROM;Left;15 reps;Supine Straight Leg Raises: AAROM;Left;15 reps;Supine Goniometric ROM: AAROM at knee -10 - 50    General Comments        Pertinent Vitals/Pain Pain Assessment: 0-10 Pain Score: 6  Pain Location: L knee Pain Descriptors / Indicators: Sore;Aching Pain Intervention(s): Limited activity within patient's tolerance;Premedicated before session;Monitored during session;Ice applied    Home Living                      Prior Function            PT Goals (current goals can now be found in the care plan section) Acute Rehab PT Goals Patient Stated Goal: Home with children PT Goal Formulation: With patient Time For Goal Achievement: 03/17/18 Potential to Achieve Goals: Good Progress towards PT goals: Progressing toward goals    Frequency    7X/week      PT Plan Current plan remains appropriate    Co-evaluation              AM-PAC PT "6 Clicks" Daily Activity  Outcome Measure  Difficulty turning over in bed (including adjusting bedclothes,  sheets and blankets)?: A Lot Difficulty moving from lying on back to sitting on the side of the bed? : A Lot Difficulty sitting down on and standing up from a chair with arms (e.g., wheelchair, bedside commode, etc,.)?: Unable Help needed moving to and from a bed to chair (including a wheelchair)?: A Little Help needed walking in hospital room?: A Little Help needed climbing 3-5 steps with a railing? : A Little 6 Click Score: 14    End of Session Equipment Utilized During Treatment: Gait belt Activity Tolerance: Patient tolerated treatment well Patient left: with call bell/phone within reach;in bed Nurse Communication: Mobility status PT Visit Diagnosis: Difficulty in walking, not elsewhere classified (R26.2)     Time: 3225-6720 PT  Time Calculation (min) (ACUTE ONLY): 31 min  Charges:  $Gait Training: 23-37 mins $Therapeutic Exercise: 8-22 mins                    G Codes:       Pg [phone]    Matthieu Loftus 03/11/2018, 3:49 PM

## 2018-03-11 NOTE — Progress Notes (Signed)
Initial Nutrition Assessment  DOCUMENTATION CODES:   Not applicable  INTERVENTION:  - Will change TF to home-recommended regimen: Vital High Protein @ 30 mL/hr x24 hours. This regimen will provide 720 kcal, 63 grams of protein, and 602 mL free water.  - Continue to encourage PO intakes. - Free water flushes as needed to meet fluid requirement.   NUTRITION DIAGNOSIS:   Inadequate oral intake related to altered GI function as evidenced by other (comment)(hx of sleeve to bypass surgery).  GOAL:   Patient will meet greater than or equal to 90% of their needs  MONITOR:   PO intake, TF tolerance, Weight trends, Labs, I & O's  REASON FOR ASSESSMENT:   Consult Enteral/tube feeding initiation and management  ASSESSMENT:   42 y.o. female, has a history of pain and functional disability in the left knee due to arthritis and has failed non-surgical conservative treatments for greater than 12 weeks to includeNSAID's and/or analgesics, corticosteriod injections, viscosupplementation injections and activity modification.Marland Kitchen POD #2 L TKA.  BMI indicates overweight status/borderline obesity. 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 Patient is followed by outpatient RD at [facility] and was last seen on 5/6. At that time, patient was soon going to switch from Osmolite 1.2 to Vital High Protein. Patient reports that this occurred and that she was receiving Vital High Protein @ 65 mL/hr x24 hours and flushing as needed for adequate hydration daily. She reports that on Tuesday (6/11) MD decreased rate to 30 mL/hr to encourage PO intakes. Per chart review, pt has been eating 50-75% of meals since admission which have mainly been small items, such as a sandwich for lunch today. She denies abdominal pain or nausea and has no nutrition-related questions or concerns at this time. She does report plan to switch from vitamin D2 to a supplement containing calcium and vitamin D3 for improved absorption.    Medications  reviewed; 100 mg Colace BID, 325 mg ferrous sulfate TID, 40 mg oral Protonix BID, 1 packet Miralax BID.  Labs reviewed; CBGs: 78-103 mg/dL today, Ca: 8.3 mg/dL.  IVF: NS @ 100 mL/hr.     NUTRITION - FOCUSED PHYSICAL EXAM:  Completed; no muscle and no fat wasting.   Diet Order:   Diet Order           Diet - low sodium heart healthy        Diet regular Room service appropriate? Yes; Fluid consistency: Thin  Diet effective now          EDUCATION NEEDS:   No education needs have been identified at this time  Skin:     Last BM:  6/14  Height:   Ht Readings from Last 1 Encounters:  03/10/18 5' 7"  (1.702 m)    Weight:   Wt Readings from Last 1 Encounters:  03/10/18 190 lb (86.2 kg)    Ideal Body Weight:  61.36 kg  BMI:  Body mass index is 29.76 kg/m.  Estimated Nutritional Needs:   Kcal:  3149-7026 (22-25 kcal/kg)  Protein:  85-100 grams  Fluid:  >/= 1.8 L/day     Jarome Matin, MS, RD, LDN, [facility] Inpatient Clinical Dietitian Pager # [phone] After hours/weekend pager # [phone]

## 2018-03-11 NOTE — Progress Notes (Signed)
Confirmed patient is active with [facility] for nursing, does not need resumption orders since currently obs status. Confirmed with [facility] that they will add PT and OT services to [facility]. [phone]

## 2018-03-11 NOTE — Plan of Care (Signed)
Reviewed plan of care with patient, specifically pain control measures, safety precautions, IS use, and importance of notifying staff with any questions or concerns. Pt attentive and verbalized understanding of all education.

## 2018-03-11 NOTE — Telephone Encounter (Signed)
Labs automatically release via MyChart.

## 2018-03-11 NOTE — Progress Notes (Signed)
Physical Therapy Treatment Patient Details Name: [name] MRN: [id number] DOB: 05/23/1976 Today's Date: 03/11/2018    History of Present Illness Pt s/p L TKR and with hx of feeding tube    PT Comments    Pt performed therex program  OOB deferred to later - Hgb 7.2 with blood transfusion just starting.   Follow Up Recommendations  Home health PT     Equipment Recommendations  None recommended by PT    Recommendations for Other Services OT consult     Precautions / Restrictions Precautions Precautions: Fall Restrictions Weight Bearing Restrictions: No Other Position/Activity Restrictions: WBAT    Mobility  Bed Mobility                  Transfers                    Ambulation/Gait                 Stairs             Wheelchair Mobility    Modified Rankin (Stroke Patients Only)       Balance                                            Cognition Arousal/Alertness: Awake/alert Behavior During Therapy: WFL for tasks assessed/performed Overall Cognitive Status: Within Functional Limits for tasks assessed                                        Exercises Total Joint Exercises Ankle Circles/Pumps: AROM;Both;20 reps;Supine Quad Sets: AROM;Both;15 reps;Supine Heel Slides: AAROM;Left;15 reps;Supine Straight Leg Raises: AAROM;Left;15 reps;Supine Goniometric ROM: AAROM at knee -10 - 50    General Comments        Pertinent Vitals/Pain Pain Assessment: 0-10 Pain Score: 6  Pain Location: L knee Pain Descriptors / Indicators: Sore;Aching Pain Intervention(s): Limited activity within patient's tolerance;Monitored during session;Premedicated before session;Ice applied    Home Living                      Prior Function            PT Goals (current goals can now be found in the care plan section) Acute Rehab PT Goals Patient Stated Goal: Home with children PT Goal Formulation:  With patient Time For Goal Achievement: 03/17/18 Potential to Achieve Goals: Good Progress towards PT goals: Progressing toward goals    Frequency    7X/week      PT Plan Current plan remains appropriate    Co-evaluation              AM-PAC PT "6 Clicks" Daily Activity  Outcome Measure  Difficulty turning over in bed (including adjusting bedclothes, sheets and blankets)?: A Lot Difficulty moving from lying on back to sitting on the side of the bed? : Unable Difficulty sitting down on and standing up from a chair with arms (e.g., wheelchair, bedside commode, etc,.)?: Unable Help needed moving to and from a bed to chair (including a wheelchair)?: A Little Help needed walking in hospital room?: A Little Help needed climbing 3-5 steps with a railing? : A Little 6 Click Score: 13    End of Session Equipment Utilized During Treatment: Gait belt Activity Tolerance: Patient  tolerated treatment well Patient left: with call bell/phone within reach;in bed Nurse Communication: Mobility status PT Visit Diagnosis: Difficulty in walking, not elsewhere classified (R26.2)     Time: 4967-5916 PT Time Calculation (min) (ACUTE ONLY): 16 min  Charges:  $Therapeutic Exercise: 8-22 mins                    G Codes:       Pg [phone]    Kazimir Hartnett 03/11/2018, 1:30 PM

## 2018-03-11 NOTE — Progress Notes (Signed)
     Subjective: 1 Day Post-Op Procedure(s) (LRB): LEFT TOTAL KNEE ARTHROPLASTY (Left)   Patient reports pain as mild, pain controlled. No events throughout the night. She is having some symptomatic anemia. Discussed getting some blood today.  If she gets the blood early and continues to do well she can be discharged home.    Patient's anticipated LOS is less than 2 midnights, meeting these requirements: - Younger than 42 - Lives within 1 hour of care - Has a competent adult at home to recover with post-op recover - NO history of  - Chronic pain requiring opiods  - Diabetes  - Coronary Artery Disease  - Heart failure  - Heart attack  - Stroke  - DVT/VTE  - Cardiac arrhythmia  - Respiratory Failure/COPD  - Renal failure  - Advanced Liver disease       Objective:   VITALS:   Vitals:   03/11/18 0121 03/11/18 0615  BP: 108/63 104/68  Pulse: 66 77  Resp: 16 16  Temp: 98.3 F (36.8 C) 98.5 F (36.9 C)  SpO2: 96% 98%    Dorsiflexion/Plantar flexion intact Incision: dressing C/D/I No cellulitis present Compartment soft  LABS Recent Labs    03/10/18 1217 03/11/18 0538  HGB 8.9* 7.2*  HCT 29.3* 23.8*  WBC 10.5 10.2  PLT 365 330    Recent Labs    03/10/18 1217 03/11/18 0538  NA  --  138  K  --  4.2  BUN  --  15  CREATININE 0.66 0.65  GLUCOSE  --  83     Assessment/Plan: 1 Day Post-Op Procedure(s) (LRB): LEFT TOTAL KNEE ARTHROPLASTY (Left) Foley cath d/c'ed Advance diet Up with therapy D/C IV fluids Discharge home with home health, if she does well after receiving blood Follow up in 2 weeks at [facility] ([place] Follow up with [facility] in 2 weeks.  Contact information:  [facility]) [address] [place] [address] [phone]      ABLA  Transfused 2 units of blood Treated with iron as well and will observe  Overweight (BMI 25-29.9) Estimated body  mass index is 29.76 kg/m as calculated from the following:   Height as of this encounter: 5' 7"  (1.702 m).   Weight as of this encounter: 86.2 kg (190 lb). Patient also counseled that weight may inhibit the healing process Patient counseled that losing weight will help with future health issues     West Pugh. Trestan Vahle   PAC  03/11/2018, 8:10 AM

## 2018-03-12 LAB — CBC
HCT: 31.2 % — ABNORMAL LOW (ref 36.0–46.0)
Hemoglobin: 9.7 g/dL — ABNORMAL LOW (ref 12.0–15.0)
MCH: 24.6 pg — ABNORMAL LOW (ref 26.0–34.0)
MCHC: 31.1 g/dL (ref 30.0–36.0)
MCV: 79.2 fL (ref 78.0–100.0)
PLATELETS: 309 10*3/uL (ref 150–400)
RBC: 3.94 MIL/uL (ref 3.87–5.11)
RDW: 15.6 % — ABNORMAL HIGH (ref 11.5–15.5)
WBC: 9.4 10*3/uL (ref 4.0–10.5)

## 2018-03-12 LAB — GLUCOSE, CAPILLARY
GLUCOSE-CAPILLARY: 100 mg/dL — AB (ref 65–99)
GLUCOSE-CAPILLARY: 103 mg/dL — AB (ref 65–99)
Glucose-Capillary: 78 mg/dL (ref 65–99)
Glucose-Capillary: 89 mg/dL (ref 65–99)
Glucose-Capillary: 114 mg/dL — ABNORMAL HIGH (ref 65–99)
Glucose-Capillary: 85 mg/dL (ref 65–99)

## 2018-03-12 LAB — BASIC METABOLIC PANEL
Anion gap: 5 (ref 5–15)
BUN: 13 mg/dL (ref 6–20)
CO2: 24 mmol/L (ref 22–32)
Calcium: 8.5 mg/dL — ABNORMAL LOW (ref 8.9–10.3)
Chloride: 111 mmol/L (ref 101–111)
Creatinine, Ser: 0.63 mg/dL (ref 0.44–1.00)
GFR calc Af Amer: >60 mL/min (ref >60)
GLUCOSE: 110 mg/dL — AB (ref 65–99)
POTASSIUM: 4.2 mmol/L (ref 3.5–5.1)
Sodium: 140 mmol/L (ref 135–145)

## 2018-03-12 NOTE — Progress Notes (Signed)
Physical Therapy Treatment Patient Details Name: [name] MRN: [id number] DOB: May 08, 1976 Today's Date: 03/12/2018    History of Present Illness Pt s/p L TKR and with hx of feeding tube    PT Comments    Pt continues cooperative and performed therex program but OOB deferred to after additional pain meds 2* elevated pain level.   Follow Up Recommendations  Home health PT     Equipment Recommendations  None recommended by PT    Recommendations for Other Services OT consult     Precautions / Restrictions Precautions Precautions: Fall Restrictions Weight Bearing Restrictions: No Other Position/Activity Restrictions: WBAT    Mobility  Bed Mobility                  Transfers                    Ambulation/Gait                 Stairs             Wheelchair Mobility    Modified Rankin (Stroke Patients Only)       Balance                                            Cognition Arousal/Alertness: Awake/alert Behavior During Therapy: WFL for tasks assessed/performed Overall Cognitive Status: Within Functional Limits for tasks assessed                                        Exercises Total Joint Exercises Ankle Circles/Pumps: AROM;Both;20 reps;Supine Quad Sets: AROM;Both;15 reps;Supine Heel Slides: AAROM;Left;15 reps;Supine Straight Leg Raises: AAROM;Left;Supine;20 reps Goniometric ROM: AAROM R knee -10 - 60    General Comments        Pertinent Vitals/Pain Pain Assessment: 0-10 Pain Score: 8  Pain Location: L knee Pain Descriptors / Indicators: Sore;Aching Pain Intervention(s): Limited activity within patient's tolerance;Monitored during session;Premedicated before session;Patient requesting pain meds-RN notified;Ice applied    Home Living                      Prior Function            PT Goals (current goals can now be found in the care plan section) Acute Rehab PT  Goals Patient Stated Goal: Home with children PT Goal Formulation: With patient Time For Goal Achievement: 03/17/18 Potential to Achieve Goals: Good Progress towards PT goals: Progressing toward goals    Frequency    7X/week      PT Plan Current plan remains appropriate    Co-evaluation              AM-PAC PT "6 Clicks" Daily Activity  Outcome Measure  Difficulty turning over in bed (including adjusting bedclothes, sheets and blankets)?: A Lot Difficulty moving from lying on back to sitting on the side of the bed? : A Lot Difficulty sitting down on and standing up from a chair with arms (e.g., wheelchair, bedside commode, etc,.)?: Unable Help needed moving to and from a bed to chair (including a wheelchair)?: A Little Help needed walking in hospital room?: A Little Help needed climbing 3-5 steps with a railing? : A Little 6 Click Score: 14    End of Session Equipment Utilized  During Treatment: Gait belt Activity Tolerance: Patient tolerated treatment well Patient left: with call bell/phone within reach;in bed Nurse Communication: Mobility status PT Visit Diagnosis: Difficulty in walking, not elsewhere classified (R26.2)     Time: 2924-4628 PT Time Calculation (min) (ACUTE ONLY): 24 min  Charges:                       G Codes:       Pg [phone]    Oceania Noori 03/12/2018, 10:30 AM

## 2018-03-12 NOTE — Progress Notes (Signed)
Subjective: 2 Days Post-Op Procedure(s) (LRB): LEFT TOTAL KNEE ARTHROPLASTY (Left) Patient reports pain as moderate and still requiring IV narcotic analgesics to control pain.   Plan is to go Home after hospital stay.  Objective: Vital signs in last 24 hours: Temp:  [97.5 F (36.4 C)-98.9 F (37.2 C)] 98.3 F (36.8 C) (06/15 0431) Pulse Rate:  [57-79] 65 (06/15 0431) Resp:  [13-18] 13 (06/15 0431) BP: (105-123)/(67-86) 118/86 (06/15 0431) SpO2:  [98 %-100 %] 98 % (06/15 0431) Weight:  [87.5 kg (192 lb 12.8 oz)] 87.5 kg (192 lb 12.8 oz) (06/15 0431)  Intake/Output from previous day:  Intake/Output Summary (Last 24 hours) at 03/12/2018 0830 Last data filed at 03/12/2018 0648 Gross per 24 hour  Intake 3052.5 ml  Output 1900 ml  Net 1152.5 ml    Intake/Output this shift: No intake/output data recorded.  Labs: Recent Labs    03/10/18 1217 03/11/18 0538 03/12/18 0544  HGB 8.9* 7.2* 9.7*   Recent Labs    03/11/18 0538 03/12/18 0544  WBC 10.2 9.4  RBC 3.04* 3.94  HCT 23.8* 31.2*  PLT 330 309   Recent Labs    03/11/18 0538 03/12/18 0544  NA 138 140  K 4.2 4.2  CL 109 111  CO2 27 24  BUN 15 13  CREATININE 0.65 0.63  GLUCOSE 83 110*  CALCIUM 8.3* 8.5*   No results for input(s): LABPT, INR in the last 72 hours.  EXAM General - Patient is Alert, Appropriate and Oriented Extremity - Neurologically intact Neurovascular intact No cellulitis present Compartment soft Dressing/Incision - clean, dry, no drainage Motor Function - intact, moving foot and toes well on exam.   Past Medical History:  Diagnosis Date  . Anal fissure - posterior 10/16/2014    OCC  ISSUES  . Anxiety    doesn't take anything  . Asthma    has Albuterol inhaler as needed  . Boil    on pubic area started septra 03-01-17 draining blood and pus  . Bronchitis   . Chronic headache disorder 07/22/2016  . Clostridium difficile infection 04/20/2012  . Colitis   . Dehydration   .  Depression   . Family history of adverse reaction to anesthesia    pt mom gets sick  . GERD (gastroesophageal reflux disease)    takes Pantoprazole daily  . Headache   . Hiatal hernia    neuropathy - mild in arms and legs  . History of blood transfusion    last transfusion was 04/04/2016=Benadryl was given d/t itching. States she always itches with transfusion.   Marland Kitchen History of bronchitis    > 2 yrs ago  . History of colon polyps    benign  . History of migraine    last one 05/01/16  . History of stomach ulcers   . History of urinary tract infection LAST 2 WEEKS AGO  . IDA (iron deficiency anemia)   . Internal and external bleeding hemorrhoids 06/11/2014  . Joint pain   . Joint swelling   . Left sided chronic colitis - segmental 06/11/2014  . Migraine   . Motion sickness   . Nausea    takes Zofran as needed  . Nausea and vomiting    for 1 year  . Obesity   . Oligouria   . Osteoarthritis    lower back, knees, wrists - no meds  . Pneumonia 1997  . Postoperative nausea and vomiting 01/21/2016   wants scopolamine patch  . SVD (spontaneous vaginal  delivery)    x 4  . Tingling    BOTH LOWER EXTREMETIES ALL THE TIME DUE TO RAPID WEIGHT LOSS  . TOBACCO USER 10/02/2009   Qualifier: Diagnosis of  By: Dimas Millin MD, Ellard Artis    . Transfusion history    last transfusion 6'16   . UC (ulcerative colitis) ([place])    supposed to be taking Lialda and Bentyl but has been off since gastric sleeve  . Ulcerative colitis ([place])   . Vertigo    doesn't take any meds    Assessment/Plan: 2 Days Post-Op Procedure(s) (LRB): LEFT TOTAL KNEE ARTHROPLASTY (Left) Principal Problem:   S/P left TKA Active Problems:   Postoperative anemia due to acute blood loss   Up with therapy  Possible discharge home if pain is better controlled this afternoon but more likely discharge tomorrow   [name] 03/12/2018, 8:30 AM

## 2018-03-12 NOTE — Progress Notes (Signed)
Physical Therapy Treatment Patient Details Name: [name] MRN: [id number] DOB: 1975/11/26 Today's Date: 03/12/2018    History of Present Illness Pt s/p L TKR and with hx of feeding tube    PT Comments    Pt continues very cooperative but pain limited.  Follow Up Recommendations  Home health PT     Equipment Recommendations  None recommended by PT    Recommendations for Other Services OT consult     Precautions / Restrictions Precautions Precautions: Fall Restrictions Weight Bearing Restrictions: No Other Position/Activity Restrictions: WBAT    Mobility  Bed Mobility Overal bed mobility: Needs Assistance Bed Mobility: Supine to Sit;Sit to Supine     Supine to sit: Min guard Sit to supine: Min assist   General bed mobility comments: cues for sequence and use of R LE to self assist  Transfers Overall transfer level: Needs assistance Equipment used: Rolling walker (2 wheeled) Transfers: Sit to/from Stand Sit to Stand: Min guard         General transfer comment: min cues for LE management and use of UEs to self assist  Ambulation/Gait Ambulation/Gait assistance: Min guard Gait Distance (Feet): 74 Feet Assistive device: Rolling walker (2 wheeled) Gait Pattern/deviations: Step-to pattern;Decreased step length - right;Decreased step length - left;Shuffle;Trunk flexed Gait velocity: decr   General Gait Details: cues for sequence, posture, position from RW and to extend L knee/hip   Stairs             Wheelchair Mobility    Modified Rankin (Stroke Patients Only)       Balance                                            Cognition Arousal/Alertness: Awake/alert Behavior During Therapy: WFL for tasks assessed/performed Overall Cognitive Status: Within Functional Limits for tasks assessed                                        Exercises Total Joint Exercises Ankle Circles/Pumps: AROM;Both;20  reps;Supine Quad Sets: AROM;Both;15 reps;Supine Heel Slides: AAROM;Left;15 reps;Supine Straight Leg Raises: AAROM;Left;Supine;20 reps Goniometric ROM: AAROM R knee -10 - 60    General Comments        Pertinent Vitals/Pain Pain Assessment: 0-10 Pain Score: 8  Pain Location: L knee Pain Descriptors / Indicators: Sore;Aching Pain Intervention(s): Limited activity within patient's tolerance;Monitored during session;Premedicated before session;Ice applied    Home Living                      Prior Function            PT Goals (current goals can now be found in the care plan section) Acute Rehab PT Goals Patient Stated Goal: Home with children PT Goal Formulation: With patient Time For Goal Achievement: 03/17/18 Potential to Achieve Goals: Good Progress towards PT goals: Progressing toward goals    Frequency    7X/week      PT Plan Current plan remains appropriate    Co-evaluation              AM-PAC PT "6 Clicks" Daily Activity  Outcome Measure  Difficulty turning over in bed (including adjusting bedclothes, sheets and blankets)?: A Lot Difficulty moving from lying on back to sitting on the side of  the bed? : A Lot Difficulty sitting down on and standing up from a chair with arms (e.g., wheelchair, bedside commode, etc,.)?: A Lot Help needed moving to and from a bed to chair (including a wheelchair)?: A Little Help needed walking in hospital room?: A Little Help needed climbing 3-5 steps with a railing? : A Little 6 Click Score: 15    End of Session Equipment Utilized During Treatment: Gait belt Activity Tolerance: Patient tolerated treatment well Patient left: with call bell/phone within reach;in bed Nurse Communication: Mobility status PT Visit Diagnosis: Difficulty in walking, not elsewhere classified (R26.2)     Time: 2284-0698 PT Time Calculation (min) (ACUTE ONLY): 21 min  Charges:  $Gait Training: 8-22 mins $Therapeutic Exercise:  23-37 mins                    G Codes:       Pg [phone]    Faaris Arizpe 03/12/2018, 1:35 PM

## 2018-03-13 ENCOUNTER — Other Ambulatory Visit: Payer: Self-pay | Admitting: Family Medicine

## 2018-03-13 LAB — GLUCOSE, CAPILLARY
GLUCOSE-CAPILLARY: 75 mg/dL (ref 65–99)
GLUCOSE-CAPILLARY: 73 mg/dL (ref 65–99)
Glucose-Capillary: 60 mg/dL — ABNORMAL LOW (ref 65–99)
Glucose-Capillary: 100 mg/dL — ABNORMAL HIGH (ref 65–99)
Glucose-Capillary: 128 mg/dL — ABNORMAL HIGH (ref 65–99)
Glucose-Capillary: 60 mg/dL — ABNORMAL LOW (ref 65–99)
Glucose-Capillary: 68 mg/dL (ref 65–99)

## 2018-03-13 MED ORDER — DEXTROSE 50 % IV SOLN
INTRAVENOUS | Status: AC
  Administered 2018-03-13: 25 mL
  Filled 2018-03-13: qty 50

## 2018-03-13 MED ORDER — HYDROCODONE-ACETAMINOPHEN 10-325 MG PO TABS
1.0000 | ORAL_TABLET | ORAL | Status: DC | PRN
  Administered 2018-03-13 – 2018-03-15 (×10): 2 via ORAL
  Filled 2018-03-13 (×10): qty 2

## 2018-03-13 NOTE — Progress Notes (Signed)
Pt reported having nose bleed this AM. Notified M. Babish, PA, & said to continue to monitor. [name], [employer]

## 2018-03-13 NOTE — Significant Event (Signed)
Cbg = 60 @ 0446. Pt has leaking PEG tube & occasional nausea, so gave 25cc D50 IVB. CBG=100 after 25 min. Dr Gladstone Lighter on unit performing rounds, notified him of above. Alwaleed Obeso, [employer]

## 2018-03-13 NOTE — Progress Notes (Signed)
Physical Therapy Treatment Patient Details Name: [name] MRN: [id number] DOB: March 13, 1976 Today's Date: 03/13/2018    History of Present Illness Pt s/p L TKR and with hx of feeding tube    PT Comments    Pt continues to progress, some issues with pain--still requesting IV meds; mild dizziness while amb d/ t meds, will see again in pm for HEP  Follow Up Recommendations  Home health PT     Equipment Recommendations  None recommended by PT    Recommendations for Other Services       Precautions / Restrictions Precautions Precautions: Fall Precaution Comments: pt hs been using KI but it is not ordered (?) Required Braces or Orthoses: Knee Immobilizer - Left Restrictions Weight Bearing Restrictions: No Other Position/Activity Restrictions: WBAT    Mobility  Bed Mobility Overal bed mobility: Needs Assistance Bed Mobility: Supine to Sit;Sit to Supine     Supine to sit: Min guard Sit to supine: Min guard   General bed mobility comments: for safety  Transfers Overall transfer level: Needs assistance Equipment used: Rolling walker (2 wheeled) Transfers: Sit to/from Stand Sit to Stand: Supervision         General transfer comment: min cues for LE management and use of UEs to self assist  Ambulation/Gait Ambulation/Gait assistance: Min guard;Supervision Gait Distance (Feet): 140 Feet Assistive device: Rolling walker (2 wheeled) Gait Pattern/deviations: Step-to pattern;Decreased step length - right;Decreased step length - left;Trunk flexed     General Gait Details: cues for RW position, safety with turns   Marine scientist Rankin (Stroke Patients Only)       Balance                                            Cognition Arousal/Alertness: Awake/alert Behavior During Therapy: WFL for tasks assessed/performed Overall Cognitive Status: Within Functional Limits for tasks assessed                                         Exercises Total Joint Exercises Ankle Circles/Pumps: (deferred ex's d/t pain)    General Comments        Pertinent Vitals/Pain Pain Assessment: 0-10 Pain Score: 7  Pain Location: L knee Pain Descriptors / Indicators: Sore;Aching Pain Intervention(s): Monitored during session;Premedicated before session;Ice applied;Patient requesting pain meds-RN notified;RN gave pain meds during session    Home Living                      Prior Function            PT Goals (current goals can now be found in the care plan section) Acute Rehab PT Goals Patient Stated Goal: Home with children PT Goal Formulation: With patient Time For Goal Achievement: 03/17/18 Potential to Achieve Goals: Good Progress towards PT goals: Progressing toward goals    Frequency    7X/week      PT Plan Current plan remains appropriate    Co-evaluation              AM-PAC PT "6 Clicks" Daily Activity  Outcome Measure  Difficulty turning over in bed (including adjusting bedclothes, sheets and blankets)?: A Little Difficulty moving from  lying on back to sitting on the side of the bed? : A Little Difficulty sitting down on and standing up from a chair with arms (e.g., wheelchair, bedside commode, etc,.)?: A Little Help needed moving to and from a bed to chair (including a wheelchair)?: A Little Help needed walking in hospital room?: A Little Help needed climbing 3-5 steps with a railing? : A Little 6 Click Score: 18    End of Session Equipment Utilized During Treatment: Left knee immobilizer Activity Tolerance: Patient tolerated treatment well Patient left: with call bell/phone within reach;in bed Nurse Communication: Mobility status PT Visit Diagnosis: Difficulty in walking, not elsewhere classified (R26.2)     Time: 7460-0298 PT Time Calculation (min) (ACUTE ONLY): 25 min  Charges:  $Gait Training: 23-37 mins                    G  CodesKenyon Ana, PT Pager: [phone] 03/13/2018    Kenyon Ana 03/13/2018, 2:24 PM

## 2018-03-13 NOTE — Progress Notes (Signed)
   03/13/18 1500  PT Visit Information  Assistance Needed +1 Steady progress, pt concerned about feeding tube and states she may be going to surgery to have it switched out tomorrow--MD notes are unclear, will continue to follow  History of Present Illness Pt s/p L TKR and with hx of feeding tube  Subjective Data  Patient Stated Goal Home with children  Precautions  Precautions Fall  Precaution Comments pt hs been using KI but it is not ordered (?)  Required Braces or Orthoses Knee Immobilizer - Left  Restrictions  Weight Bearing Restrictions No  Other Position/Activity Restrictions WBAT  Pain Assessment  Pain Assessment 0-10  Pain Score 7  Pain Location L knee  Pain Descriptors / Indicators Sore;Aching  Pain Intervention(s) Monitored during session  Cognition  Arousal/Alertness Awake/alert  Behavior During Therapy WFL for tasks assessed/performed  Overall Cognitive Status Within Functional Limits for tasks assessed  Bed Mobility  Overal bed mobility Needs Assistance  Bed Mobility Supine to Sit;Sit to Supine  Supine to sit Min guard  Sit to supine Min guard  General bed mobility comments for safety  Transfers  Overall transfer level Needs assistance  Equipment used Rolling walker (2 wheeled)  Transfers Sit to/from Stand  Sit to Stand Supervision  General transfer comment min cues for LE management and use of UEs to self assist  Ambulation/Gait  Ambulation/Gait assistance Min guard;Supervision  Gait Distance (Feet) 15 Feet (x2)  Assistive device Rolling walker (2 wheeled)  Gait Pattern/deviations Step-to pattern;Decreased step length - right;Decreased step length - left;Trunk flexed  General Gait Details cues for RW position, safety with turns  Total Joint Exercises  Ankle Circles/Pumps AROM;Both;20 reps;Supine  Quad Sets AROM;Both;15 reps;Supine  Heel Slides AAROM;Left;Supine;10 reps  Hip ABduction/ADduction AAROM;Left;10 reps  Straight Leg Raises AAROM;Left;10 reps   Goniometric ROM 10* to 55*, incr musle guarding  PT - End of Session  Equipment Utilized During Treatment Left knee immobilizer  Activity Tolerance Patient tolerated treatment well  Patient left with call bell/phone within reach;in bed  Nurse Communication Mobility status   PT - Assessment/Plan  PT Plan Current plan remains appropriate  PT Visit Diagnosis Difficulty in walking, not elsewhere classified (R26.2)  PT Frequency (ACUTE ONLY) 7X/week  Follow Up Recommendations Home health PT  PT equipment None recommended by PT  AM-PAC PT "6 Clicks" Daily Activity Outcome Measure  Difficulty turning over in bed (including adjusting bedclothes, sheets and blankets)? 3  Difficulty moving from lying on back to sitting on the side of the bed?  3  Difficulty sitting down on and standing up from a chair with arms (e.g., wheelchair, bedside commode, etc,.)? 3  Help needed moving to and from a bed to chair (including a wheelchair)? 3  Help needed walking in hospital room? 3  Help needed climbing 3-5 steps with a railing?  3  6 Click Score 18  Mobility G Code  CK  Acute Rehab PT Goals  PT Goal Formulation With patient  Time For Goal Achievement 03/17/18  Potential to Achieve Goals Good  PT Time Calculation  PT Start Time (ACUTE ONLY) 1506  PT Stop Time (ACUTE ONLY) 1537  PT Time Calculation (min) (ACUTE ONLY) 31 min  PT General Charges  $$ ACUTE PT VISIT 1 Visit  PT Treatments  $Gait Training 8-22 mins  $Therapeutic Exercise 8-22 mins

## 2018-03-13 NOTE — Progress Notes (Signed)
     Subjective: 3 Days Post-Op Procedure(s) (LRB): LEFT TOTAL KNEE ARTHROPLASTY (Left)   Patient reports pain as mild, pain controlled. Still needing the occasional IV medications, discussed changing dosing. Feels that the knee is progressing well Patient has had an issue since yesterday with regards to her feeding tube.  Every time food is pushed into the tube, there is significant leaking around the tube, questioning how much nutrition she is receiving.    Objective:   VITALS:   Vitals:   03/13/18 0109 03/13/18 0459  BP: 123/80 124/79  Pulse: 73 77  Resp: 16 16  Temp: 98 F (36.7 C) 98.3 F (36.8 C)  SpO2: 99% 100%    Dorsiflexion/Plantar flexion intact Incision: dressing C/D/I No cellulitis present Compartment soft  LABS Recent Labs    03/10/18 1217 03/11/18 0538 03/12/18 0544  HGB 8.9* 7.2* 9.7*  HCT 29.3* 23.8* 31.2*  WBC 10.5 10.2 9.4  PLT 365 330 309    Recent Labs    03/10/18 1217 03/11/18 0538 03/12/18 0544  NA  --  138 140  K  --  4.2 4.2  BUN  --  15 13  CREATININE 0.66 0.65 0.63  GLUCOSE  --  83 110*     Assessment/Plan: 3 Days Post-Op Procedure(s) (LRB): LEFT TOTAL KNEE ARTHROPLASTY (Left) Patient allergic to Oxycodone, will increase the dose of Norco Up with therapy Discharge home eventually when ready  Patient's previous bariatric surgeries preformed by Dr. Excell Seltzer.  Discussed the patient's situation with Dr. Johnathan Hausen, he states that he will come by and evaluate the feeding tube.   West Pugh Tinnie Kunin   PAC  03/13/2018, 9:50 AM

## 2018-03-13 NOTE — Progress Notes (Addendum)
Feeding tube began leaking again, saturating the dsg. Capped tube off & notified Dr Hassell Done. Orders given to leave G-tube capped for now & have IR change tube Monday. [name], [employer]

## 2018-03-13 NOTE — Consult Note (Signed)
Chief Complaint:  Leaking tube gastrostomy into her gastric remnant  History of Present Illness:  [name] is an 42 y.o. female who underwent revision gastrojejunostomy and #22 Foley G tube placement into the gastric remnant for nutritional supplementation.  She has been dependent on that.  The G tube has been leaking at the entrance site since surgery.    Past Medical History:  Diagnosis Date  . Anal fissure - posterior 10/16/2014    OCC  ISSUES  . Anxiety    doesn't take anything  . Asthma    has Albuterol inhaler as needed  . Boil    on pubic area started septra 03-01-17 draining blood and pus  . Bronchitis   . Chronic headache disorder 07/22/2016  . Clostridium difficile infection 04/20/2012  . Colitis   . Dehydration   . Depression   . Family history of adverse reaction to anesthesia    pt mom gets sick  . GERD (gastroesophageal reflux disease)    takes Pantoprazole daily  . Headache   . Hiatal hernia    neuropathy - mild in arms and legs  . History of blood transfusion    last transfusion was 04/04/2016=Benadryl was given d/t itching. States she always itches with transfusion.   Marland Kitchen History of bronchitis    > 2 yrs ago  . History of colon polyps    benign  . History of migraine    last one 05/01/16  . History of stomach ulcers   . History of urinary tract infection LAST 2 WEEKS AGO  . IDA (iron deficiency anemia)   . Internal and external bleeding hemorrhoids 06/11/2014  . Joint pain   . Joint swelling   . Left sided chronic colitis - segmental 06/11/2014  . Migraine   . Motion sickness   . Nausea    takes Zofran as needed  . Nausea and vomiting    for 1 year  . Obesity   . Oligouria   . Osteoarthritis    lower back, knees, wrists - no meds  . Pneumonia 1997  . Postoperative nausea and vomiting 01/21/2016   wants scopolamine patch  . SVD (spontaneous vaginal delivery)    x 4  . Tingling    BOTH LOWER EXTREMETIES ALL THE TIME DUE TO RAPID WEIGHT LOSS  .  TOBACCO USER 10/02/2009   Qualifier: Diagnosis of  By: Dimas Millin MD, Ellard Artis    . Transfusion history    last transfusion 6'16   . UC (ulcerative colitis) ([place])    supposed to be taking Lialda and Bentyl but has been off since gastric sleeve  . Ulcerative colitis ([place])   . Vertigo    doesn't take any meds    Past Surgical History:  Procedure Laterality Date  . ABDOMINAL HYSTERECTOMY     PARTIAL  . COLONOSCOPY  2007   for rectal bleeding; [facility]  . COLONOSCOPY N/A 06/11/2014   Procedure: COLONOSCOPY;  Surgeon: Gatha Mayer, MD;  Location: [facility];  Service: Endoscopy;  Laterality: N/A;  . COLONOSCOPY WITH PROPOFOL N/A 03/02/2017   Procedure: COLONOSCOPY WITH PROPOFOL;  Surgeon: Alphonsa Overall, MD;  [facility]: [facility];  Service: General;  Laterality: N/A;  . DILATION AND CURETTAGE OF UTERUS    . ESOPHAGOGASTRODUODENOSCOPY (EGD) WITH PROPOFOL N/A 04/06/2016   Procedure: ESOPHAGOGASTRODUODENOSCOPY (EGD) WITH PROPOFOL;  Surgeon: Alphonsa Overall, MD;  [facility];  Service: General;  Laterality: N/A;  . ESOPHAGOGASTRODUODENOSCOPY (EGD) WITH PROPOFOL N/A 03/02/2017   Procedure: ESOPHAGOGASTRODUODENOSCOPY (  EGD) WITH PROPOFOL;  Surgeon: Alphonsa Overall, MD;  Location: [facility];  Service: General;  Laterality: N/A;  . ESOPHAGOGASTRODUODENOSCOPY (EGD) WITH PROPOFOL N/A 05/20/2017   Procedure: ESOPHAGOGASTRODUODENOSCOPY (EGD) WITH PROPOFOL ERAS PATHWAY;  Surgeon: Alphonsa Overall, MD;  [facility];  Service: General;  Laterality: N/A;  . ESOPHAGOGASTRODUODENOSCOPY (EGD) WITH PROPOFOL N/A 10/07/2017   Procedure: ESOPHAGOGASTRODUODENOSCOPY (EGD) WITH PROPOFOL;  Surgeon: Alphonsa Overall, MD;  Location: [facility];  Service: General;  Laterality: N/A;  . EXCISION OF SKIN TAG  06/08/2017   Procedure: EXCISION OF VULVAR SKIN TAGS X2;  Surgeon: [name], MD;  Location: [facility];  Service: Gynecology;;  . FOOT SURGERY Bilateral    x 2  . GASTRIC ROUX-EN-Y N/A 12/14/2016   Procedure:  LAPAROSCOPIC REVISION SLEEVE GASTRECTOMY TO  ROUX-Y-GASTRIC BY-PASS, UPPER ENDO;  Surgeon: Excell Seltzer, MD;  Location: [facility];  Service: General;  Laterality: N/A;  . GASTROJEJUNOSTOMY N/A 05/11/2016   Procedure: LAPAROSCOPIC PLACEMENT  OF FEEDING  JEJUNOSTOMY TUBE;  Surgeon: Excell Seltzer, MD;  [facility];  Service: General;  Laterality: N/A;  . HEMORRHOIDECTOMY WITH HEMORRHOID BANDING    . IR FLUORO GUIDE CV LINE RIGHT  04/06/2017  . IR GENERIC HISTORICAL  05/19/2016   IR CM INJ ANY COLONIC TUBE W/FLUORO 05/19/2016 Aletta Edouard, MD [facility]  . IR PATIENT EVAL TECH 0-60 MINS  11/30/2017  . IR US GUIDE VASC ACCESS RIGHT  04/06/2017  . j tube removed march 2018    . KNEE ARTHROSCOPY Left 09/06/2014  . LAPAROSCOPIC GASTRIC SLEEVE RESECTION N/A 01/14/2016   Procedure: LAPAROSCOPIC GASTRIC SLEEVE RESECTION;  Surgeon: Excell Seltzer, MD;  [facility];  Service: General;  Laterality: N/A;  . LAPAROSCOPIC REVISION OF GASTROJEJUNOSTOMY N/A 10/25/2017   Procedure: LAPAROSCOPIC REVISION OF GASTROJEJUNOSTOMY AND PARTIAL GASTRECTOMY, WITH PLACEMENT OF FEEDING GASTROSTOMY TUBE;  Surgeon: Excell Seltzer, MD;  [facility];  Service: General;  Laterality: N/A;  . LAPAROSCOPIC TUBAL LIGATION  10/16/2011   Procedure: LAPAROSCOPIC TUBAL LIGATION;  Surgeon: Alwyn Pea, MD;  Location: [facility];  Service: Gynecology;  Laterality: Bilateral;  . NOVASURE ABLATION  09/28/2010   mild persistent vaginal bleeding  . right knee arthroscopy     05/07/2016  . TOTAL KNEE ARTHROPLASTY Left 03/10/2018   Procedure: LEFT TOTAL KNEE ARTHROPLASTY;  Surgeon: Paralee Cancel, MD;  [facility];  Service: Orthopedics;  Laterality: Left;  70 mins  . TUBAL LIGATION    . VAGINAL HYSTERECTOMY Bilateral 06/08/2017   Procedure: HYSTERECTOMY VAGINAL W/ BILATERAL SALPINGECTOMY;  Surgeon: [name], MD;  Location: [facility];  Service: Gynecology;  Laterality: Bilateral;  . wisdom teeth extracted       Current Facility-Administered Medications  Medication Dose Route Frequency Provider Last Rate Last Dose  . 0.9 %  sodium chloride infusion   Intravenous Continuous Danae Orleans, PA-C   Stopped at 03/12/18 1230  . albuterol (PROVENTIL) (2.5 MG/3ML) 0.083% nebulizer solution 2.5 mg  2.5 mg Inhalation Q6H PRN Danae Orleans, PA-C      . alum & mag hydroxide-simeth (MAALOX/MYLANTA) 200-200-20 MG/5ML suspension 15 mL  15 mL Oral Q4H PRN Babish, , PA-C      . bisacodyl (DULCOLAX) suppository 10 mg  10 mg Rectal Daily PRN Danae Orleans, PA-C   10 mg at 03/12/18 1619  . diphenhydrAMINE (BENADRYL) 12.5 MG/5ML elixir 12.5-25 mg  12.5-25 mg Oral Q4H PRN Danae Orleans, PA-C   25 mg at 03/11/18 1819  . docusate sodium (COLACE) capsule 100 mg  100 mg Oral BID Danae Orleans, PA-C   100 mg at 03/13/18 9924  . enoxaparin (LOVENOX) injection 40 mg  40 mg Subcutaneous Q24H Danae Orleans, PA-C   40 mg at 03/13/18 2683  . feeding supplement (VITAL HIGH PROTEIN) liquid 1,000 mL  1,000 mL Per Tube Q24H Paralee Cancel, MD   1,000 mL at 03/12/18 1515  . ferrous sulfate tablet 325 mg  325 mg Oral TID PC Danae Orleans, PA-C   325 mg at 03/13/18 4196  . FLUoxetine (PROZAC) capsule 20 mg  20 mg Oral Daily Danae Orleans, PA-C   20 mg at 03/13/18 2229  . gabapentin (NEURONTIN) capsule 300 mg  300 mg Oral Q8H Babish, Rodman Key, PA-C   300 mg at 03/13/18 0539  . HYDROcodone-acetaminophen (NORCO) 10-325 MG per tablet 1-2 tablet  1-2 tablet Oral Q4H PRN Danae Orleans, PA-C      . HYDROmorphone (DILAUDID) injection 0.5-2 mg  0.5-2 mg Intravenous Q2H PRN Danae Orleans, PA-C   2 mg at 03/13/18 0818  . linaclotide (LINZESS) capsule 290 mcg  290 mcg Oral QAC breakfast Danae Orleans, PA-C   290 mcg at 03/13/18 0803  . magnesium citrate solution 1 Bottle  1 Bottle Oral Once PRN Babish, , PA-C      . menthol-cetylpyridinium (CEPACOL) lozenge 3 mg  1 lozenge Oral PRN Danae Orleans, PA-C       Or  .  phenol (CHLORASEPTIC) mouth spray 1 spray  1 spray Mouth/Throat PRN Danae Orleans, PA-C      . methocarbamol (ROBAXIN) tablet 500 mg  500 mg Oral Q6H PRN Danae Orleans, PA-C   500 mg at 03/13/18 7989   Or  . methocarbamol (ROBAXIN) 500 mg in dextrose 5 % 50 mL IVPB  500 mg Intravenous Q6H PRN Babish, , PA-C      . metoCLOPramide (REGLAN) tablet 5-10 mg  5-10 mg Oral Q8H PRN Danae Orleans, PA-C       Or  . metoCLOPramide (REGLAN) injection 5-10 mg  5-10 mg Intravenous Q8H PRN Danae Orleans, PA-C   10 mg at 03/12/18 1253  . ondansetron (ZOFRAN) tablet 4 mg  4 mg Oral Q6H PRN Danae Orleans, PA-C   4 mg at 03/12/18 1001   Or  . ondansetron (ZOFRAN) injection 4 mg  4 mg Intravenous Q6H PRN Danae Orleans, PA-C   4 mg at 03/12/18 0227  . pantoprazole (PROTONIX) EC tablet 40 mg  40 mg Oral BID AC Danae Orleans, PA-C   40 mg at 03/13/18 0803  . polyethylene glycol (MIRALAX / GLYCOLAX) packet 17 g  17 g Oral BID Danae Orleans, PA-C   17 g at 03/13/18 0942   Coconut oil; Morphine and related; Oxycodone; Ciprofloxacin; Doxycycline; Adhesive [tape]; and Penicillins Family History  Problem Relation Age of Onset  . Hypertension Mother   . Diabetes Mother   . Sarcoidosis Mother        lungs and skin  . Asthma Mother   . Hypertension Father   . Diabetes Father   . Asthma Son   . Tics Son   . Asthma Sister   . Cancer Sister        possible pancreatic cancer  . Adrenal disorder Sister        Tumor   . Asthma Brother   . Asthma Daughter        died age 109.5  . Cancer Daughter 4       brain; died age 109.5  . Asthma Son   .  Cancer Paternal Aunt        brain, colon, lung and esophagus; unsure of primary    Social History:   reports that she quit smoking about 3 years ago. Her smoking use included cigarettes. She has a 10.00 pack-year smoking history. She has never used smokeless tobacco. She reports that she does not drink alcohol or use drugs.   REVIEW OF SYSTEMS : Negative  except for see problem list  Physical Exam:   Blood pressure 123/77, pulse 73, temperature 98.3 F (36.8 C), temperature source Oral, resp. rate 16, height 5' 7" (1.702 m), weight 87.5 kg (192 lb 12.8 oz), last menstrual period 06/02/2017, SpO2 100 %. Body mass index is 30.2 kg/m.  Gen:  WDWN AAF NAD  Neurological: Alert and oriented to person, place, and time. Motor and sensory function is grossly intact   Abdomen:  Latex Foley #22 exiting the upper abdomen with dressing around it.  Balloon aspirated and 5 cc saline instilled into the balloon.    LABORATORY RESULTS: Results for orders placed or performed during the hospital encounter of 03/10/18 (from the past 48 hour(s))  Glucose, capillary     Status: Abnormal   Collection Time: 03/11/18 11:44 AM  Result Value Ref Range   Glucose-Capillary 102 (H) 65 - 99 mg/dL  Glucose, capillary     Status: Abnormal   Collection Time: 03/11/18  4:49 PM  Result Value Ref Range   Glucose-Capillary 116 (H) 65 - 99 mg/dL  Glucose, capillary     Status: Abnormal   Collection Time: 03/12/18 12:31 AM  Result Value Ref Range   Glucose-Capillary 100 (H) 65 - 99 mg/dL  Glucose, capillary     Status: None   Collection Time: 03/12/18  4:22 AM  Result Value Ref Range   Glucose-Capillary 78 65 - 99 mg/dL  CBC     Status: Abnormal   Collection Time: 03/12/18  5:44 AM  Result Value Ref Range   WBC 9.4 4.0 - 10.5 K/uL   RBC 3.94 3.87 - 5.11 MIL/uL   Hemoglobin 9.7 (L) 12.0 - 15.0 g/dL    Comment: POST TRANSFUSION SPECIMEN   HCT 31.2 (L) 36.0 - 46.0 %   MCV 79.2 78.0 - 100.0 fL   MCH 24.6 (L) 26.0 - 34.0 pg   MCHC 31.1 30.0 - 36.0 g/dL   RDW 15.6 (H) 11.5 - 15.5 %   Platelets 309 150 - 400 K/uL    Comment: Performed at [facility], [place] [address], [place], [place] [address]  Basic metabolic panel     Status: Abnormal   Collection Time: 03/12/18  5:44 AM  Result Value Ref Range   Sodium 140 135 - 145 mmol/L   Potassium 4.2 3.5 -  5.1 mmol/L   Chloride 111 101 - 111 mmol/L   CO2 24 22 - 32 mmol/L   Glucose, Bld 110 (H) 65 - 99 mg/dL   BUN 13 6 - 20 mg/dL   Creatinine, Ser 0.63 0.44 - 1.00 mg/dL   Calcium 8.5 (L) 8.9 - 10.3 mg/dL   GFR calc non Af Amer >60 >60 mL/min   GFR calc Af Amer >60 >60 mL/min    Comment: (NOTE) The eGFR has been calculated using the CKD EPI equation. This calculation has not been validated in all clinical situations. eGFR's persistently <60 mL/min signify possible Chronic Kidney Disease.    Anion gap 5 5 - 15    Comment: Performed at [facility], [address] [place], [place] [address]  Glucose, capillary     Status: None   Collection Time: 03/12/18  7:44 AM  Result Value Ref Range   Glucose-Capillary 89 65 - 99 mg/dL  Glucose, capillary     Status: Abnormal   Collection Time: 03/12/18 12:01 PM  Result Value Ref Range   Glucose-Capillary 103 (H) 65 - 99 mg/dL  Glucose, capillary     Status: Abnormal   Collection Time: 03/12/18  4:52 PM  Result Value Ref Range   Glucose-Capillary 114 (H) 65 - 99 mg/dL  Glucose, capillary     Status: None   Collection Time: 03/12/18  8:04 PM  Result Value Ref Range   Glucose-Capillary 85 65 - 99 mg/dL  Glucose, capillary     Status: None   Collection Time: 03/13/18  1:11 AM  Result Value Ref Range   Glucose-Capillary 75 65 - 99 mg/dL  Glucose, capillary     Status: Abnormal   Collection Time: 03/13/18  4:46 AM  Result Value Ref Range   Glucose-Capillary 60 (L) 65 - 99 mg/dL  Glucose, capillary     Status: Abnormal   Collection Time: 03/13/18  8:28 AM  Result Value Ref Range   Glucose-Capillary 100 (H) 65 - 99 mg/dL     RADIOLOGY RESULTS: No results found.  Problem List: Patient Active Problem List   Diagnosis Date Noted  . Postoperative anemia due to acute blood loss 03/11/2018  . 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 B12 deficiency 12/01/2017  . Precordial chest pain 11/04/2017  . Rapid or irregular heartbeat  11/04/2017  . Chest pain at rest 11/04/2017  . Right shoulder pain 11/04/2017  . Marginal ulcer 10/25/2017  . Dehydration 04/02/2017  . Malnutrition, calorie ([place]) 03/27/2017  . Psychophysiological insomnia 03/18/2017  . Nausea and vomiting 12/14/2016  . Paresthesia 07/22/2016  . Chronic headache disorder 07/22/2016  . Memory difficulty 07/22/2016  . Hematochezia 04/04/2016  . 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 Anemia, iron deficiency 10/01/2015  . Anal fissure - posterior 10/16/2014  . Left sided chronic colitis - segmental 06/11/2014  . Internal and external bleeding hemorrhoids 06/11/2014  . Asthma 04/26/2012  . Reactive depression 10/02/2009    Assessment & Plan: Attempt to restart tube feedings.  If leaking persists with get silicone tube replacement prior to discharge.      Matt B. Hassell Done, MD, [facility] [phone] beeper [phone]  03/13/2018 10:24 AM

## 2018-03-13 NOTE — Progress Notes (Signed)
Reconnected tube feeding, ran 50cc water to flush. No leakage noted. Resumed tube feeding. Jowel Waltner, [employer]

## 2018-03-14 ENCOUNTER — Encounter (HOSPITAL_COMMUNITY): Payer: Self-pay | Admitting: Interventional Radiology

## 2018-03-14 ENCOUNTER — Inpatient Hospital Stay (HOSPITAL_COMMUNITY): Payer: 59

## 2018-03-14 HISTORY — PX: IR REPLC DUODEN/JEJUNO TUBE PERCUT W/FLUORO: IMG2334

## 2018-03-14 LAB — BPAM RBC
BLOOD PRODUCT EXPIRATION DATE: 201907062359
Blood Product Expiration Date: 201907062359
ISSUE DATE / TIME: 201906141745
ISSUE DATE / TIME: 201906141037
UNIT TYPE AND RH: 5100
Unit Type and Rh: 5100

## 2018-03-14 LAB — GLUCOSE, CAPILLARY
GLUCOSE-CAPILLARY: 111 mg/dL — AB (ref 65–99)
GLUCOSE-CAPILLARY: 113 mg/dL — AB (ref 65–99)
GLUCOSE-CAPILLARY: 91 mg/dL (ref 65–99)
GLUCOSE-CAPILLARY: 93 mg/dL (ref 65–99)
Glucose-Capillary: 114 mg/dL — ABNORMAL HIGH (ref 65–99)
Glucose-Capillary: 116 mg/dL — ABNORMAL HIGH (ref 65–99)
Glucose-Capillary: 65 mg/dL (ref 65–99)
Glucose-Capillary: 85 mg/dL (ref 65–99)
Glucose-Capillary: 95 mg/dL (ref 65–99)

## 2018-03-14 LAB — TYPE AND SCREEN
ABO/RH(D): O POS
ANTIBODY SCREEN: NEGATIVE
Unit division: 0
Unit division: 0

## 2018-03-14 MED ORDER — LIDOCAINE VISCOUS HCL 2 % MT SOLN
OROMUCOSAL | Status: AC
  Filled 2018-03-14: qty 15

## 2018-03-14 MED ORDER — IOPAMIDOL (ISOVUE-300) INJECTION 61%
50.0000 mL | Freq: Once | INTRAVENOUS | Status: AC | PRN
  Administered 2018-03-14: 10 mL

## 2018-03-14 MED ORDER — IOPAMIDOL (ISOVUE-300) INJECTION 61%
INTRAVENOUS | Status: AC
  Administered 2018-03-14: 10 mL
  Filled 2018-03-14: qty 50

## 2018-03-14 NOTE — Progress Notes (Signed)
Patient ID: LATICA HOHMANN, female   DOB: 1976-01-27, 42 y.o.   MRN: [id number] Subjective: 4 Days Post-Op Procedure(s) (LRB): LEFT TOTAL KNEE ARTHROPLASTY (Left)    Patient reports pain as mild to moderate as it pertains to her left knee depending on activity.  Her post operative course has been complicated by leakage around her gastric tube.  As of this pm she has had her tube ring replaced.  Objective:   VITALS:   Vitals:   03/14/18 0143 03/14/18 0528  BP: 129/80 121/71  Pulse: 78 78  Resp: 14 14  Temp: 100 F (37.8 C) 99.1 F (37.3 C)  SpO2: 100% 99%    Neurovascular intact Incision: dressing C/D/I  LABS Recent Labs    03/12/18 0544  HGB 9.7*  HCT 31.2*  WBC 9.4  PLT 309    Recent Labs    03/12/18 0544  NA 140  K 4.2  BUN 13  CREATININE 0.63  GLUCOSE 110*    No results for input(s): LABPT, INR in the last 72 hours.   Assessment/Plan: 4 Days Post-Op Procedure(s) (LRB): LEFT TOTAL KNEE ARTHROPLASTY (Left)   Advance diet - reconnect tube feedings per pharmacy to assure functionality prior to D/C, General Surgery on board Up with therapy Plan for discharge tomorrow

## 2018-03-14 NOTE — Progress Notes (Signed)
[facility] Progress Note  4 Days Post-Op  Subjective: CC:  Mild abd pain around tube site. Endorses 2 epsiodes N/V yesterday but states that this always happens when she eats "normal food". Typically does 24h TF at home. Reports since she held her TF for a total knee surgery 6/13 her TF have been coming out around her tube.   Objective: Vital signs in last 24 hours: Temp:  [99.1 F (37.3 C)-101.3 F (38.5 C)] 99.1 F (37.3 C) (06/17 0528) Pulse Rate:  [74-89] 78 (06/17 0528) Resp:  [14-16] 14 (06/17 0528) BP: (116-129)/(70-92) 121/71 (06/17 0528) SpO2:  [99 %-100 %] 99 % (06/17 0528) Weight:  [94.4 kg (208 lb 3.2 oz)] 94.4 kg (208 lb 3.2 oz) (06/17 0315) Last BM Date: 03/13/18  Intake/Output from previous day: 06/16 0701 - 06/17 0700 In: 420 [P.O.:360] Out: -  Intake/Output this shift: No intake/output data recorded.  PE: Gen:  Alert, NAD, pleasant Card:  Regular rate and rhythm, pedal pulses 2+ BL Pulm:  Normal effort, clear to auscultation bilaterally Abd: Soft, non-tender, non-distended, bowel sounds present, G-tube in place with TF on gauze dressing, no surrounding erythema. Skin: warm and dry, no rashes  Psych: A&Ox3   Lab Results:  Recent Labs    03/12/18 0544  WBC 9.4  HGB 9.7*  HCT 31.2*  PLT 309   BMET Recent Labs    03/12/18 0544  NA 140  K 4.2  CL 111  CO2 24  GLUCOSE 110*  BUN 13  CREATININE 0.63  CALCIUM 8.5*   PT/INR No results for input(s): LABPROT, INR in the last 72 hours. CMP     Component Value Date/Time   NA 140 03/12/2018 0544   NA 139 02/28/2018 1354   K 4.2 03/12/2018 0544   CL 111 03/12/2018 0544   CO2 24 03/12/2018 0544   GLUCOSE 110 (H) 03/12/2018 0544   BUN 13 03/12/2018 0544   BUN 15 02/28/2018 1354   CREATININE 0.63 03/12/2018 0544   CREATININE 0.88 06/05/2016 1003   CALCIUM 8.5 (L) 03/12/2018 0544   PROT 7.1 02/28/2018 1354   ALBUMIN 3.9 02/28/2018 1354   AST 24 02/28/2018 1354   ALT 18 02/28/2018  1354   ALKPHOS 62 02/28/2018 1354   BILITOT 0.2 02/28/2018 1354   GFRNONAA >60 03/12/2018 0544   GFRNONAA 84 06/05/2016 1003   GFRAA >60 03/12/2018 0544   GFRAA >89 06/05/2016 1003   Lipase     Component Value Date/Time   LIPASE 18 12/15/2017 1722       Studies/Results: No results found.  Anti-infectives: Anti-infectives (From admission, onward)   Start     Dose/Rate Route Frequency Ordered Stop   03/10/18 1400  ceFAZolin (ANCEF) IVPB 2g/100 mL premix     2 g 200 mL/hr over 30 Minutes Intravenous Every 6 hours 03/10/18 1113 03/10/18 2215   03/10/18 0715  vancomycin (VANCOCIN) 1-5 GM/200ML-% IVPB    Note to Pharmacy:  Delena Bali   : cabinet override      03/10/18 0715 03/10/18 1929   03/10/18 0600  ceFAZolin (ANCEF) IVPB 2g/100 mL premix     2 g 200 mL/hr over 30 Minutes Intravenous On call to O.R. 03/10/18 0544 03/10/18 0738     Assessment/Plan Clogged gastrostomy tube  - S/P laparoscopic revision gastrojejunostomy and placement G-tube 10/25/17 [facility]  - had to hold her 24 h TF prior to L total knee 6/13 and tube clogged off at this time  - consult IR  for gastrostomy tube exchange     LOS: 4 days    Jill Alexanders , [facility] 03/14/2018, 9:56 AM Pager: [phone] Consults: [phone] Mon-Fri 7:00 am-4:30 pm Sat-Sun 7:00 am-11:30 am

## 2018-03-14 NOTE — Progress Notes (Signed)
MEDICATION-RELATED CONSULT NOTE   IR Procedure Consult - Anticoagulant/Antiplatelet PTA/Inpatient Med List Review by Pharmacist    Procedure: IR replacement of gastrostomy tube    Completed: 03/14/18 @ 17:21  Post-Procedural bleeding risk per IR MD assessment:  LOW  Antithrombotic medications on inpatient or PTA profile prior to procedure:   Enoxaparin 77m sq q24h (last dose charted 03/14/18 @ 08:42)    Recommended restart time per IR Post-Procedure Guidelines:  Day 0 (at least 4 hrs or at next standard dose interval)   Other considerations:      Plan:     Continue Enoxaparin 435msq q24h, with next dose due at next scheduled time of 03/15/18 @ 0800  LeLeone HavenPharmD

## 2018-03-14 NOTE — Progress Notes (Signed)
Physical Therapy Treatment Patient Details Name: [name] MRN: [id number] DOB: Feb 13, 1976 Today's Date: 03/14/2018    History of Present Illness Pt s/p L TKR and with hx of feeding tube    PT Comments    POD # 4 am session Pt in bed with KI and ICE.  Removed ice and reapplied KI.  Assisted OOB to amb a greater distance in hallway. Returned to room and performed some TKR TE's but limited due to pain level.     Follow Up Recommendations  Home health PT     Equipment Recommendations  None recommended by PT    Recommendations for Other Services       Precautions / Restrictions Precautions Precautions: Fall Precaution Comments: pt hs been using KI but it is not ordered (?) Required Braces or Orthoses: Knee Immobilizer - Left Restrictions Weight Bearing Restrictions: No Other Position/Activity Restrictions: WBAT    Mobility  Bed Mobility Overal bed mobility: Needs Assistance Bed Mobility: Supine to Sit;Sit to Supine     Supine to sit: Min guard Sit to supine: Min guard   General bed mobility comments: for safety and increased time  Transfers Overall transfer level: Needs assistance Equipment used: Rolling walker (2 wheeled) Transfers: Sit to/from [facility] Sit to Stand: Supervision Stand pivot transfers: Supervision       General transfer comment: min cues for LE management and use of UEs to self assist  Ambulation/Gait Ambulation/Gait assistance: Supervision Gait Distance (Feet): 75 Feet Assistive device: Rolling walker (2 wheeled) Gait Pattern/deviations: Step-to pattern;Decreased step length - right;Decreased step length - left;Trunk flexed Gait velocity: decr   General Gait Details: cues for RW position, safety with turns   Marine scientist Rankin (Stroke Patients Only)       Balance                                            Cognition Arousal/Alertness:  Awake/alert Behavior During Therapy: WFL for tasks assessed/performed Overall Cognitive Status: Within Functional Limits for tasks assessed                                        Exercises   Total Knee Replacement TE's 10 reps B LE ankle pumps 10 reps knee presses 10 reps heel slides  10 reps SLR's 10 reps ABD Followed by ICE    General Comments        Pertinent Vitals/Pain Pain Assessment: 0-10 Pain Score: 8  Pain Location: L knee Pain Descriptors / Indicators: Sore;Aching Pain Intervention(s): Monitored during session;Repositioned;Patient requesting pain meds-RN notified;Ice applied    Home Living                      Prior Function            PT Goals (current goals can now be found in the care plan section) Progress towards PT goals: Progressing toward goals    Frequency    7X/week      PT Plan Current plan remains appropriate    Co-evaluation              AM-PAC PT "6 Clicks" Daily Activity  Outcome Measure  Difficulty turning over in bed (including adjusting bedclothes, sheets and blankets)?: A Little Difficulty moving from lying on back to sitting on the side of the bed? : A Little Difficulty sitting down on and standing up from a chair with arms (e.g., wheelchair, bedside commode, etc,.)?: A Little Help needed moving to and from a bed to chair (including a wheelchair)?: A Little Help needed walking in hospital room?: A Little Help needed climbing 3-5 steps with a railing? : A Little 6 Click Score: 18    End of Session Equipment Utilized During Treatment: Left knee immobilizer Activity Tolerance: Patient tolerated treatment well Patient left: with call bell/phone within reach;in bed Nurse Communication: Mobility status PT Visit Diagnosis: Difficulty in walking, not elsewhere classified (R26.2)     Time: 5430-1484 PT Time Calculation (min) (ACUTE ONLY): 25 min  Charges:  $Gait Training: 8-22 mins $Therapeutic  Exercise: 8-22 mins                    G Codes:       Rica Koyanagi  PTA [facility]  Acute  Rehab Pager      [phone]

## 2018-03-14 NOTE — Procedures (Signed)
Pre procedural Dx: Dysphagia, poorly functioning feeding tube. Post procedural Dx: Same  Successful fluoroscopic guided replacement of exisitng 22 Fr gastrostomy tube.   The feeding tube is ready for immediate use.  EBL: None  Complications: None immediate.  Ronny Bacon, MD Pager #: [phone]

## 2018-03-14 NOTE — Progress Notes (Signed)
PT Cancellation Note  Patient Details Name: [name] MRN: [id number] DOB: [birth year]   Cancelled Treatment:     no pm PT session as pt headed downstairs for procedure.    Rica Koyanagi  PTA [facility]  Acute  Rehab Pager      [phone]

## 2018-03-15 ENCOUNTER — Inpatient Hospital Stay (HOSPITAL_COMMUNITY): Payer: 59

## 2018-03-15 ENCOUNTER — Encounter (HOSPITAL_COMMUNITY): Payer: Self-pay | Admitting: Interventional Radiology

## 2018-03-15 HISTORY — PX: IR REPLC DUODEN/JEJUNO TUBE PERCUT W/FLUORO: IMG2334

## 2018-03-15 LAB — GLUCOSE, CAPILLARY
GLUCOSE-CAPILLARY: 83 mg/dL (ref 65–99)
Glucose-Capillary: 77 mg/dL (ref 65–99)
Glucose-Capillary: 90 mg/dL (ref 65–99)
Glucose-Capillary: 91 mg/dL (ref 65–99)
Glucose-Capillary: 70 mg/dL (ref 65–99)

## 2018-03-15 MED ORDER — IOPAMIDOL (ISOVUE-300) INJECTION 61%: 50.0000 mL | Freq: Once | INTRAVENOUS | Status: DC | PRN

## 2018-03-15 MED ORDER — METHOCARBAMOL 500 MG PO TABS: 500.0000 mg | ORAL_TABLET | Freq: Four times a day (QID) | ORAL | 0 refills | Status: DC | PRN

## 2018-03-15 MED ORDER — IOPAMIDOL (ISOVUE-300) INJECTION 61%
INTRAVENOUS | Status: AC
  Filled 2018-03-15: qty 50

## 2018-03-15 MED ORDER — HYDROCODONE-ACETAMINOPHEN 10-325 MG PO TABS: 1.0000 | ORAL_TABLET | ORAL | 0 refills | Status: DC | PRN

## 2018-03-15 MED ORDER — ENOXAPARIN SODIUM 40 MG/0.4ML ~~LOC~~ SOLN: 40.0000 mg | SUBCUTANEOUS | 0 refills | Status: DC

## 2018-03-15 NOTE — Procedures (Signed)
Pre procedure Dx: Leaking J-tube Post Procedure Dx: Same  Successful fluoroscopic guided exchange and upsizing of new, slightly larger, now 24 Fr feeding J tube. The J-tube may be used immediately.  EBL: Minimal  Complications: None immediate  Ronny Bacon, MD Pager #: [phone]

## 2018-03-15 NOTE — Consult Note (Signed)
[facility] Nurse wound consult note Reason for Consult: Peristomal leaking around jejunostomy tube.  Was switched yesterday.  May benefit from yet again a larger size as the leaking continues. IR is aware.  Wound type:SKin is intact but exposed to frequent gastric leaking.  Pressure Injury POA: NA Drainage (amount, consistency, odor) MOderate thick effluent noted around drain sponge and patient indicates this was just changed.   Periwound:Intact skin Dressing procedure/placement/frequency:Dry dressing around J tube.  Change PRN due to frequent leaks.  IR has discussed switching tube again to larger size and I agree.  Will not follow at this time.  Please re-consult if needed.  Domenic Moras RN BSN [place] Pager [phone]

## 2018-03-15 NOTE — Progress Notes (Signed)
Physical Therapy Treatment Patient Details Name: [name] MRN: [id number] DOB: [birth year] Today's Date: 03/15/2018    History of Present Illness Pt s/p L TKR and with hx of feeding tube    PT Comments    POD # 5 Assisted with amb in hallway and practiced one step.  Pt hopes to D/C to home today.     Follow Up Recommendations  Home health PT     Equipment Recommendations  None recommended by PT    Recommendations for Other Services       Precautions / Restrictions Precautions Precautions: Fall Restrictions Weight Bearing Restrictions: No Other Position/Activity Restrictions: WBAT    Mobility  Bed Mobility               General bed mobility comments: OOB in recliner  Transfers Overall transfer level: Needs assistance Equipment used: Rolling walker (2 wheeled) Transfers: Sit to/from [employer] Transfers Sit to Stand: Supervision Stand pivot transfers: Supervision       General transfer comment: min cues for LE management and use of UEs to self assist  Ambulation/Gait Ambulation/Gait assistance: Supervision Gait Distance (Feet): 45 Feet Assistive device: Rolling walker (2 wheeled) Gait Pattern/deviations: Step-to pattern;Decreased step length - right;Decreased step length - left;Trunk flexed Gait velocity: decr   General Gait Details: cues for RW position, safety with turns   Stairs Stairs: Yes Stairs assistance: Min guard Stair Management: No rails;Step to pattern;With walker Number of Stairs: 1 General stair comments: 25% VC's proper walker placement and sequencing   Wheelchair Mobility    Modified Rankin (Stroke Patients Only)       Balance                                            Cognition Arousal/Alertness: Awake/alert Behavior During Therapy: WFL for tasks assessed/performed Overall Cognitive Status: Within Functional Limits for tasks assessed                                         Exercises      General Comments        Pertinent Vitals/Pain Pain Assessment: 0-10 Pain Score: 8  Pain Location: L knee Pain Descriptors / Indicators: Sore;Aching Pain Intervention(s): Monitored during session;Repositioned;RN gave pain meds during session;Ice applied    Home Living                      Prior Function            PT Goals (current goals can now be found in the care plan section) Progress towards PT goals: Progressing toward goals    Frequency    7X/week      PT Plan Current plan remains appropriate    Co-evaluation              AM-PAC PT "6 Clicks" Daily Activity  Outcome Measure  Difficulty turning over in bed (including adjusting bedclothes, sheets and blankets)?: A Little Difficulty moving from lying on back to sitting on the side of the bed? : A Little Difficulty sitting down on and standing up from a chair with arms (e.g., wheelchair, bedside commode, etc,.)?: A Little Help needed moving to and from a bed to chair (including a wheelchair)?: A Little Help needed walking in hospital  room?: A Little Help needed climbing 3-5 steps with a railing? : A Little 6 Click Score: 18    End of Session Equipment Utilized During Treatment: Gait belt Activity Tolerance: Patient tolerated treatment well Patient left: with call bell/phone within reach;in bed Nurse Communication: Mobility status       Time: 7276-1848 PT Time Calculation (min) (ACUTE ONLY): 14 min  Charges:  $Gait Training: 8-22 mins                    G Codes:       Rica Koyanagi  PTA [facility]  Acute  Rehab Pager      [phone]

## 2018-03-15 NOTE — Progress Notes (Signed)
Patient ID: [name], female   DOB: 09/18/1976, 42 y.o.   MRN: [id number] Subjective: 5 Days Post-Op Procedure(s) (LRB): LEFT TOTAL KNEE ARTHROPLASTY (Left)    Patient reports pain as mild.  Continued hospitalization due to issues surrounding her PEG tube.  Been up walking and working with therapy.  Apparently still with some leakage around her feeding tube  Objective:   VITALS:   Vitals:   03/14/18 2145 03/15/18 0502  BP: 112/84 119/80  Pulse: 67 74  Resp: 16 17  Temp: 98.5 F (36.9 C) 98.2 F (36.8 C)  SpO2: 100% 100%    Neurovascular intact Incision: dressing C/D/I  LABS No results for input(s): HGB, HCT, WBC, PLT in the last 72 hours.  No results for input(s): NA, K, BUN, CREATININE, GLUCOSE in the last 72 hours.  No results for input(s): LABPT, INR in the last 72 hours.   Assessment/Plan: 5 Days Post-Op Procedure(s) (LRB): LEFT TOTAL KNEE ARTHROPLASTY (Left)   Up with therapy  Plan will be to go home today after reinsertion of new tube RTC in 2 weeks Goals reviewed

## 2018-03-15 NOTE — Progress Notes (Signed)
MEDICATION-RELATED CONSULT NOTE   IR Procedure Consult - Anticoagulant/Antiplatelet PTA/Inpatient Med List Review by Pharmacist    Procedure: fluoroscopic guided exchange and upsizing of J tube   Completed: 6/18 at 17:42  Post-Procedural bleeding risk per IR MD assessment:  LOW  Antithrombotic medications on inpatient or PTA profile prior to procedure:   Enoxaparin 40 mg SQ q24h, last dose on 6/18 at 0803   Recommended restart time per IR Post-Procedure Guidelines:  Day 0 (at least 4 hours or at next standard dose interval)  Plan:    Continue Enoxaparin 76m SQ q24h, next dose due on 6/19.  CGretta ArabPharmD, BCPS Pager [phone]/18/2019 6:26 PM

## 2018-03-15 NOTE — Progress Notes (Addendum)
5 Days Post-Op    CC:  Chronic feeding jejunostomy tube with leak  Subjective: Pt tube changed yesterday with marked improvement in the leak, but still leaking some.  They are talking about placing the next larger size up in there today, according to the patient, but I don't see anything from radiology.  I told her I would leave that up to them.    Objective: Vital signs in last 24 hours: Temp:  [98.2 F (36.8 C)-98.5 F (36.9 C)] 98.2 F (36.8 C) (06/18 0502) Pulse Rate:  [67-74] 74 (06/18 0502) Resp:  [16-17] 17 (06/18 0502) BP: (112-119)/(80-84) 119/80 (06/18 0502) SpO2:  [100 %] 100 % (06/18 0502) Weight:  [91.6 kg (202 lb)] 91.6 kg (202 lb) (06/18 0300) Last BM Date: 03/12/18 PO 500 Urine  Not recorded Afebrile, yesterday, VSS No labs glucose 77-91 Fluoroscopic duodenal/jejunostomy tube replacement with Fluoro - completed yesterday 4:30 PM. Intake/Output from previous day: 06/17 0701 - 06/18 0700 In: 500 [P.O.:500] Out: 0  Intake/Output this shift: No intake/output data recorded.  General appearance: alert, cooperative and no distress GI: soft, non-tender; bowel sounds normal; no masses,  no organomegaly and J tube in place, with minimal leak.  No erythema.   Lab Results:  No results for input(s): WBC, HGB, HCT, PLT in the last 72 hours.  BMET No results for input(s): NA, K, CL, CO2, GLUCOSE, BUN, CREATININE, CALCIUM in the last 72 hours. PT/INR No results for input(s): LABPROT, INR in the last 72 hours.  No results for input(s): AST, ALT, ALKPHOS, BILITOT, PROT, ALBUMIN in the last 168 hours.   Lipase     Component Value Date/Time   LIPASE 18 12/15/2017 1722     Medications: . docusate sodium  100 mg Oral BID  . enoxaparin (LOVENOX) injection  40 mg Subcutaneous Q24H  . feeding supplement (VITAL HIGH PROTEIN)  1,000 mL Per Tube Q24H  . ferrous sulfate  325 mg Oral TID PC  . FLUoxetine  20 mg Oral Daily  . gabapentin  300 mg Oral Q8H  . linaclotide  290  mcg Oral QAC breakfast  . pantoprazole  40 mg Oral BID AC  . polyethylene glycol  17 g Oral BID   . sodium chloride Stopped (03/12/18 1230)  . methocarbamol (ROBAXIN)  IV     Anti-infectives (From admission, onward)   Start     Dose/Rate Route Frequency Ordered Stop   03/10/18 1400  ceFAZolin (ANCEF) IVPB 2g/100 mL premix     2 g 200 mL/hr over 30 Minutes Intravenous Every 6 hours 03/10/18 1113 03/10/18 2215   03/10/18 0715  vancomycin (VANCOCIN) 1-5 GM/200ML-% IVPB    Note to Pharmacy:  [name]   : cabinet override      03/10/18 0715 03/10/18 1929   03/10/18 0600  ceFAZolin (ANCEF) IVPB 2g/100 mL premix     2 g 200 mL/hr over 30 Minutes Intravenous On call to O.R. 03/10/18 0544 03/10/18 0738      Assessment/Plan Clogged gastrostomy tube  - S/P laparoscopic revision gastrojejunostomy and placement G-tube 10/25/17 BH  - had to hold her 24 h TF prior to L total knee 6/13 and tube clogged off at this time  - consult IR for gastrostomy tube exchange done yesterday, may upsize it today again.   FEN:  TF @ 30 ml/hr/regular diet ID:  None currently DVT:  Lovenox Follow up:  She follows Dr. Excell Seltzer monthly,  She can follow up with him as usual.  Plan:  I will defer to IR on placing the next size up.  I will ask wound care to check on her later and see if there is something to put around the tube site to protect the skin if it does leak. We will be available as needed.  She follows up monthly with Dr. Excell Seltzer.              LOS: 5 days    [name] 03/15/2018 [phone]

## 2018-03-15 NOTE — Telephone Encounter (Signed)
Interface request refill for vitamin D2   Last refill:  12/21/17   LOV 02/28/18 with Reginia Forts MD

## 2018-03-16 DIAGNOSIS — K289 Gastrojejunal ulcer, unspecified as acute or chronic, without hemorrhage or perforation: Secondary | ICD-10-CM | POA: Diagnosis not present

## 2018-03-16 DIAGNOSIS — K9423 Gastrostomy malfunction: Secondary | ICD-10-CM | POA: Diagnosis not present

## 2018-03-16 DIAGNOSIS — Z9884 Bariatric surgery status: Secondary | ICD-10-CM | POA: Diagnosis not present

## 2018-03-17 ENCOUNTER — Telehealth: Payer: Self-pay | Admitting: Family Medicine

## 2018-03-17 DIAGNOSIS — K289 Gastrojejunal ulcer, unspecified as acute or chronic, without hemorrhage or perforation: Secondary | ICD-10-CM | POA: Diagnosis not present

## 2018-03-17 DIAGNOSIS — Z9884 Bariatric surgery status: Secondary | ICD-10-CM | POA: Diagnosis not present

## 2018-03-17 DIAGNOSIS — K9423 Gastrostomy malfunction: Secondary | ICD-10-CM | POA: Diagnosis not present

## 2018-03-17 NOTE — Telephone Encounter (Signed)
Copied from [place] [phone] [id number]. Topic: Inquiry >> Mar 16, 2018  5:05 PM Oliver Pila B wrote: Reason for CRM: [facility] Deaner PT called and is needing verbal orders for pt contact [phone]

## 2018-03-18 DIAGNOSIS — K9423 Gastrostomy malfunction: Secondary | ICD-10-CM | POA: Diagnosis not present

## 2018-03-18 DIAGNOSIS — Z9884 Bariatric surgery status: Secondary | ICD-10-CM | POA: Diagnosis not present

## 2018-03-18 DIAGNOSIS — K289 Gastrojejunal ulcer, unspecified as acute or chronic, without hemorrhage or perforation: Secondary | ICD-10-CM | POA: Diagnosis not present

## 2018-03-18 NOTE — Discharge Summary (Signed)
Physician Discharge Summary  Patient ID: [name] MRN: [id number] DOB/AGE: 40-Jun-1977 40 y.o.  Admit date: 03/10/2018 Discharge date: 03/15/2018   Procedures:  Procedure(s) (LRB): LEFT TOTAL KNEE ARTHROPLASTY (Left)  Attending Physician:  Dr. Paralee Cancel   Admission Diagnoses:   Left knee primary OA / pain  Discharge Diagnoses:  Principal Problem:   S/P left TKA Active Problems:   Postoperative anemia due to acute blood loss  Past Medical History:  Diagnosis Date  . Anal fissure - posterior 10/16/2014    OCC  ISSUES  . Anxiety    doesn't take anything  . Asthma    has Albuterol inhaler as needed  . Boil    on pubic area started septra 03-01-17 draining blood and pus  . Bronchitis   . Chronic headache disorder 07/22/2016  . Clostridium difficile infection 04/20/2012  . Colitis   . Dehydration   . Depression   . Family history of adverse reaction to anesthesia    pt mom gets sick  . GERD (gastroesophageal reflux disease)    takes Pantoprazole daily  . Headache   . Hiatal hernia    neuropathy - mild in arms and legs  . History of blood transfusion    last transfusion was 04/04/2016=Benadryl was given d/t itching. States she always itches with transfusion.   Marland Kitchen History of bronchitis    > 2 yrs ago  . History of colon polyps    benign  . History of migraine    last one 05/01/16  . History of stomach ulcers   . History of urinary tract infection LAST 2 WEEKS AGO  . IDA (iron deficiency anemia)   . Internal and external bleeding hemorrhoids 06/11/2014  . Joint pain   . Joint swelling   . Left sided chronic colitis - segmental 06/11/2014  . Migraine   . Motion sickness   . Nausea    takes Zofran as needed  . Nausea and vomiting    for 1 year  . Obesity   . Oligouria   . Osteoarthritis    lower back, knees, wrists - no meds  . Pneumonia 1997  . Postoperative nausea and vomiting 01/21/2016   wants scopolamine patch  . SVD (spontaneous vaginal delivery)      x 4  . Tingling    BOTH LOWER EXTREMETIES ALL THE TIME DUE TO RAPID WEIGHT LOSS  . TOBACCO USER 10/02/2009   Qualifier: Diagnosis of  By: Dimas Millin MD, Ellard Artis    . Transfusion history    last transfusion 6'16   . UC (ulcerative colitis) ([place])    supposed to be taking Lialda and Bentyl but has been off since gastric sleeve  . Ulcerative colitis ([place])   . Vertigo    doesn't take any meds    HPI:    [name], 42 y.o. female, has a history of pain and functional disability in the left knee due to arthritis and has failed non-surgical conservative treatments for greater than 12 weeks to includeNSAID's and/or analgesics, corticosteriod injections, viscosupplementation injections and activity modification.  Onset of symptoms was gradual, starting 3+ years ago with gradually worsening course since that time. The patient noted prior procedures on the knee to include  arthroscopy on the left knee(s).  Patient currently rates pain in the left knee(s) at 10 out of 10 with activity. Patient has night pain, worsening of pain with activity and weight bearing, pain that interferes with activities of daily living, pain with passive  range of motion, crepitus and joint swelling.  Patient has evidence of periarticular osteophytes and joint space narrowing by imaging studies. There is no active infection.  Risks, benefits and expectations were discussed with the patient.  Risks including but not limited to the risk of anesthesia, blood clots, nerve damage, blood vessel damage, failure of the prosthesis, infection and up to and including death.  Patient understand the risks, benefits and expectations and wishes to proceed with surgery.   PCP: Wardell Honour, MD   Discharged Condition: good  Hospital Course:  Patient underwent the above stated procedure on 03/10/2018. Patient tolerated the procedure well and brought to the recovery room in good condition and subsequently to the floor.  POD #1 BP: 104/68 ; Pulse:  77 ; Temp: 98.5 F (36.9 C) ; Resp: 16 Patient reports pain as mild, pain controlled. No events throughout the night. She is having some symptomatic anemia. Discussed getting some blood today.  Dorsiflexion/plantar flexion intact, incision: dressing C/D/I, no cellulitis present and compartment soft.   LABS  Basename    HGB     7.2  HCT     23.8   POD #2  BP: 118/86 ; Pulse: 65 ; Temp: 98.3 F (36.8 C) ; Resp: 13 Patient reports pain as moderate and still requiring IV narcotic analgesics to control pain.  Plan is to go home after hospital stay. Patient is Alert, Appropriate and Oriented.  Neurologically intact, neurovascular intact, no cellulitis present and compartment soft.  Dressing/Incision - clean, dry, no drainage.  Motor Function - intact, moving foot and toes well on exam.   LABS  Basename    HGB     9.7  HCT     31.2   POD #3  BP: 124/79 ; Pulse: 77 ; Temp: 98.3 F (36.8 C) ; Resp: 16 Patient reports pain as mild, pain controlled. Still needing the occasional IV medications, discussed changing dosing. Feels that the knee is progressing well Patient has had an issue since yesterday with regards to her feeding tube.  Every time food is pushed into the tube, there is significant leaking around the tube, questioning how much nutrition she is receiving.  Dorsiflexion/plantar flexion intact, incision: dressing C/D/I, no cellulitis present and compartment soft.   LABS   No new labs  POD #4  BP: 121/71 ; Pulse: 78 ; Temp: 99.1 F (37.3 C) ; Resp: 14 Patient reports pain as mild to moderate as it pertains to her left knee depending on activity.  Her post operative course has been complicated by leakage around her gastric tube.  As of this pm she has had her tube ring replaced. Neurovascular intact and incision: dressing C/D/I.   LABS   No new labs  POD #5  BP: 119/80 ; Pulse: 74 ; Temp: 98.2 F (36.8 C) ; Resp: 17 Patient reports pain as mild.  Continued hospitalization due to  issues surrounding her PEG tube.  Been up walking and working with therapy.  Apparently still with some leakage around her feeding tube.  IR to see today and replace the tube with a larger ring.  If able to be preformed today and she is stable to can be discharged home. Neurovascular intact and incision: dressing C/D/I.   LABS   No new labs   Discharge Exam: General appearance: alert, cooperative and no distress Extremities: Homans sign is negative, no sign of DVT, no edema, redness or tenderness in the calves or thighs and no ulcers, gangrene or  trophic changes  Disposition:  Home with follow up in 2 weeks   Follow-up Information    Paralee Cancel, MD. Schedule an appointment as soon as possible for a visit in 2 weeks.   Specialty:  Orthopedic Surgery Contact information: [address] [place] [address] [phone]        [facility] Follow up.   Specialty:  Home Health Services Why:  nurse, physical therapy and occupational therapy Contact information: [address] [place] [address] [phone]           Discharge Instructions    Call MD / Call 911   Complete by:  As directed    If you experience chest pain or shortness of breath, CALL 911 and be transported to the hospital emergency room.  If you develope a fever above 101 F, pus (white drainage) or increased drainage or redness at the wound, or calf pain, call your surgeon's office.   Change dressing   Complete by:  As directed    Maintain surgical dressing until follow up in the clinic. If the edges start to pull up, may reinforce with tape. If the dressing is no longer working, may remove and cover with gauze and tape, but must keep the area dry and clean.  Call with any questions or concerns.   Constipation Prevention   Complete by:  As directed    Drink plenty of fluids.  Prune juice may be helpful.  You may use a stool softener, such as Colace (over the  counter) 100 mg twice a day.  Use MiraLax (over the counter) for constipation as needed.   Diet - low sodium heart healthy   Complete by:  As directed    Discharge instructions   Complete by:  As directed    Maintain surgical dressing until follow up in the clinic. If the edges start to pull up, may reinforce with tape. If the dressing is no longer working, may remove and cover with gauze and tape, but must keep the area dry and clean.  Follow up in 2 weeks at [facility]. Call with any questions or concerns.   Increase activity slowly as tolerated   Complete by:  As directed    Weight bearing as tolerated with assist device (walker, cane, etc) as directed, use it as long as suggested by your surgeon or therapist, typically at least 4-6 weeks.   TED hose   Complete by:  As directed    Use stockings (TED hose) for 2 weeks on both leg(s).  You may remove them at night for sleeping.      Allergies as of 03/15/2018      Reactions   Coconut Oil Anaphylaxis, Itching   Morphine And Related Anaphylaxis, Hives   Pt states that she has tolerated Norco and Dilaudid   Oxycodone Anaphylaxis   Ciprofloxacin Itching, Rash   Doxycycline Nausea And Vomiting   Adhesive [tape] Rash   And paper tape causes a rash if wearing for a prolong period of time   Penicillins Rash   Has patient had a PCN reaction causing immediate rash, facial/tongue/throat swelling, SOB or lightheadedness with hypotension: Yes Has patient had a PCN reaction causing severe rash involving mucus membranes or skin necrosis: No Has patient had a PCN reaction that required hospitalization No Has patient had a PCN reaction occurring within the last 10 years: No If all of the above answers are "NO", then may proceed with Cephalosporin use.  Medication List    STOP taking these medications   acetaminophen 500 MG tablet Commonly known as:  TYLENOL   cyanocobalamin 1000 MCG/ML injection Commonly known as:  (VITAMIN  B-12)   HYDROcodone-acetaminophen 5-325 MG tablet Commonly known as:  NORCO/VICODIN Replaced by:  HYDROcodone-acetaminophen 10-325 MG tablet     TAKE these medications   albuterol 108 (90 Base) MCG/ACT inhaler Commonly known as:  PROVENTIL HFA;VENTOLIN HFA Inhale 2 puffs into the lungs every 6 (six) hours as needed for wheezing or shortness of breath. For shortness of breath What changed:  additional instructions   CALCIUM CITRATE PO 10 mLs by Feeding Tube route every other day. In the afternoon.   docusate sodium 100 MG capsule Commonly known as:  COLACE Take 1 capsule (100 mg total) by mouth 2 (two) times daily.   enoxaparin 40 MG/0.4ML injection Commonly known as:  LOVENOX Inject 0.4 mLs (40 mg total) into the skin daily.   ferrous sulfate 325 (65 FE) MG tablet Commonly known as:  FERROUSUL Take 1 tablet (325 mg total) by mouth 3 (three) times daily with meals.   FLUoxetine 20 MG tablet Commonly known as:  PROZAC Take 1 tablet (20 mg total) by mouth daily.   HYDROcodone-acetaminophen 10-325 MG tablet Commonly known as:  NORCO Take 1-2 tablets by mouth every 4 (four) hours as needed for moderate pain or severe pain (1 pill pain scale 1-5, 2 pill pain scale 6-10). Replaces:  HYDROcodone-acetaminophen 5-325 MG tablet   linaclotide 290 MCG Caps capsule Commonly known as:  LINZESS Take 1 capsule (290 mcg total) by mouth daily before breakfast.   methocarbamol 500 MG tablet Commonly known as:  ROBAXIN Take 1 tablet (500 mg total) by mouth every 6 (six) hours as needed for muscle spasms.   multivitamin Liqd Place 10 mLs into feeding tube daily. Bariatric Liquid Multivitamin   ondansetron 8 MG disintegrating tablet Commonly known as:  ZOFRAN-ODT Take 1 tablet (8 mg total) by mouth every 8 (eight) hours as needed for nausea.   pantoprazole 40 MG tablet Commonly known as:  PROTONIX Take 1 tablet (40 mg total) by mouth 2 (two) times daily before a meal.   polyethylene  glycol packet Commonly known as:  MIRALAX / GLYCOLAX Take 17 g by mouth 2 (two) times daily.   thiamine 100 MG tablet Commonly known as:  VITAMIN B-1 Take 100 mg by mouth daily at 12 noon.   VITAL HP 1.0 CAL Liqd 65 mL/hr by Feeding Tube route continuous.   Vitamin D (Ergocalciferol) 50000 units Caps capsule Commonly known as:  DRISDOL TAKE 1 CAPSULE (50,000 UNITS TOTAL) BY MOUTH EVERY 7 (SEVEN) DAYS. What changed:  when to take this            Discharge Care Instructions  (From admission, onward)        Start     Ordered   03/11/18 0000  Change dressing    Comments:  Maintain surgical dressing until follow up in the clinic. If the edges start to pull up, may reinforce with tape. If the dressing is no longer working, may remove and cover with gauze and tape, but must keep the area dry and clean.  Call with any questions or concerns.   03/11/18 4970       Signed: [place]. Amoura Ransier   PA-C  03/18/2018, 3:27 PM

## 2018-03-18 NOTE — Telephone Encounter (Signed)
Called Herbert Deaner this afternoon and gave verbal orders for PT to continue. Informed to call the office back for anymore information that may be needed.

## 2018-03-18 NOTE — Telephone Encounter (Signed)
Please provide/approve verbal orders for PT.

## 2018-03-21 DIAGNOSIS — Z471 Aftercare following joint replacement surgery: Secondary | ICD-10-CM | POA: Diagnosis not present

## 2018-03-21 DIAGNOSIS — K9423 Gastrostomy malfunction: Secondary | ICD-10-CM | POA: Diagnosis not present

## 2018-03-21 DIAGNOSIS — K289 Gastrojejunal ulcer, unspecified as acute or chronic, without hemorrhage or perforation: Secondary | ICD-10-CM | POA: Diagnosis not present

## 2018-03-22 ENCOUNTER — Telehealth: Payer: Self-pay | Admitting: Family Medicine

## 2018-03-22 DIAGNOSIS — K289 Gastrojejunal ulcer, unspecified as acute or chronic, without hemorrhage or perforation: Secondary | ICD-10-CM | POA: Diagnosis not present

## 2018-03-22 DIAGNOSIS — K9423 Gastrostomy malfunction: Secondary | ICD-10-CM | POA: Diagnosis not present

## 2018-03-22 DIAGNOSIS — Z471 Aftercare following joint replacement surgery: Secondary | ICD-10-CM | POA: Diagnosis not present

## 2018-03-22 NOTE — Telephone Encounter (Unsigned)
Copied from [place] [id number]. Topic: General - Other >> Mar 22, 2018 11:12 AM Yvette Rack wrote: Reason for CRM: Alonna Minium with [facility] requested verbal orders for Speech Therapy for 1 time a week for the next 2 weeks for strategies for short term memory deficit. Cb# [phone]

## 2018-03-22 NOTE — Telephone Encounter (Signed)
Verbal given 

## 2018-03-23 ENCOUNTER — Telehealth: Payer: Self-pay | Admitting: Family Medicine

## 2018-03-23 DIAGNOSIS — K289 Gastrojejunal ulcer, unspecified as acute or chronic, without hemorrhage or perforation: Secondary | ICD-10-CM | POA: Diagnosis not present

## 2018-03-23 DIAGNOSIS — Z471 Aftercare following joint replacement surgery: Secondary | ICD-10-CM | POA: Diagnosis not present

## 2018-03-23 DIAGNOSIS — K9423 Gastrostomy malfunction: Secondary | ICD-10-CM | POA: Diagnosis not present

## 2018-03-23 NOTE — Telephone Encounter (Signed)
Copied from [place] [phone]. Topic: Quick Communication - See Telephone Encounter >> Mar 23, 2018  5:00 PM Selinda Flavin B, NT wrote: CRM for notification. See Telephone encounter for: 03/23/18. Beth with [facility] calling to inform Dr Tamala Julian of patient's bp. States that it has been in the 120-130/90 the last few visits. They are seeing her for a knee replacement. She is not sure if it is because of the pain from her knee or if it is something else all together. Please advise.   CB#:[phone]

## 2018-03-23 NOTE — Telephone Encounter (Signed)
Copied from [place] [phone]. Topic: Quick Communication - See Telephone Encounter >> Mar 23, 2018 12:40 PM Hewitt Shorts wrote: Manuela Schwartz from home health is needing verbal orders for home health OT for 2 weeks 1 week and 1 week tor 2 weeks   Best number for susan is [phone]

## 2018-03-23 NOTE — Telephone Encounter (Signed)
Called and gave verbal or. Advised to give our office a call back if they have any more questions.

## 2018-03-23 NOTE — Telephone Encounter (Signed)
Message re: pt BP elevation sent to Dr. Tamala Julian

## 2018-03-25 DIAGNOSIS — M25462 Effusion, left knee: Secondary | ICD-10-CM | POA: Diagnosis not present

## 2018-03-25 NOTE — Telephone Encounter (Signed)
Blood pressure readings noted.  Patient is s/p total knee replacement on 03/10/18; thus, pain, very likely contributing to blood pressure readings.  No further action warranted at this time other than ongoing monitoring.  Upcoming appointment with me next week on 03/30/18.

## 2018-03-25 NOTE — Progress Notes (Signed)
Subjective:    Patient ID: [name], female    DOB: Jan 27, 1976, 42 y.o.   MRN: [id number]  03/30/2018  Medical Management of Chronic Issues (one month follow up )    HPI This 42 y.o. female presents for [facility] and for follow-up after total knee replacement performed on 03/10/18. In horrible pain currently due to not able to take opiate for appointment. Having crazy dreams due to opiates and pain. S/p 60cc drainage out of knee last week.  Feeling much better.  No fever/chills/sweats.  Low grade fever; Tmax 99.0.   Received a call from physical therapist due to elevated blood pressures.   Constipated.  Taking stool softeners; taking Dulcolax and Miralax twice daily. Had bowel movement yesterday.  Third week sicne TKR.   No eating orally; tries to eat but still vomits. Receiving tube feeds.   2 units PRBC. Vitamin D level 18.9.    Sees [facility] on 04/09/18.   Outpatient PT starting 04-11-18.  Horrible rash between legs after surgery and on L wrist.  Horribly itchy.   BP Readings from Last 3 Encounters:  03/30/18 110/78  03/15/18 106/71  03/03/18 111/62   Wt Readings from Last 3 Encounters:  03/30/18 185 lb 6.4 oz (84.1 kg)  03/15/18 202 lb (91.6 kg)  03/03/18 190 lb (86.2 kg)   Immunization History  Administered Date(s) Administered  . Influenza,inj,Quad PF,6+ Mos 09/02/2015, 07/21/2016, 05/19/2017  . Influenza-Unspecified 07/30/2014  . Tdap 05/18/2015    Review of Systems  Constitutional: Negative for activity change, appetite change, chills, diaphoresis, fatigue, fever and unexpected weight change.  HENT: Negative for congestion, dental problem, drooling, ear discharge, ear pain, facial swelling, hearing loss, mouth sores, nosebleeds, postnasal drip, rhinorrhea, sinus pressure, sneezing, sore throat, tinnitus, trouble swallowing and voice change.   Eyes: Negative for photophobia, pain, discharge, redness, itching and visual disturbance.    Respiratory: Negative for apnea, cough, choking, chest tightness, shortness of breath, wheezing and stridor.   Cardiovascular: Negative for chest pain, palpitations and leg swelling.  Gastrointestinal: Negative for abdominal distention, abdominal pain, anal bleeding, blood in stool, constipation, diarrhea, nausea, rectal pain and vomiting.  Endocrine: Negative for cold intolerance, heat intolerance, polydipsia, polyphagia and polyuria.  Genitourinary: Negative for decreased urine volume, difficulty urinating, dyspareunia, dysuria, enuresis, flank pain, frequency, genital sores, hematuria, menstrual problem, pelvic pain, urgency, vaginal bleeding, vaginal discharge and vaginal pain.  Musculoskeletal: Positive for arthralgias, gait problem and joint swelling. Negative for back pain, myalgias, neck pain and neck stiffness.  Skin: Negative for color change, pallor, rash and wound.  Allergic/Immunologic: Negative for environmental allergies, food allergies and immunocompromised state.  Neurological: Negative for dizziness, tremors, seizures, syncope, facial asymmetry, speech difficulty, weakness, light-headedness, numbness and headaches.  Hematological: Negative for adenopathy. Does not bruise/bleed easily.  Psychiatric/Behavioral: Positive for dysphoric mood. Negative for agitation, behavioral problems, confusion, decreased concentration, hallucinations, self-injury, sleep disturbance and suicidal ideas. The patient is not nervous/anxious and is not hyperactive.     Past Medical History:  Diagnosis Date  . Anal fissure - posterior 10/16/2014    OCC  ISSUES  . Anxiety    doesn't take anything  . Asthma    has Albuterol inhaler as needed  . Boil    on pubic area started septra 03-01-17 draining blood and pus  . Bronchitis   . Chronic headache disorder 07/22/2016  . Clostridium difficile infection 04/20/2012  . Colitis   . Dehydration   . Depression   . Family history  of adverse reaction to  anesthesia    pt mom gets sick  . GERD (gastroesophageal reflux disease)    takes Pantoprazole daily  . Headache   . Hiatal hernia    neuropathy - mild in arms and legs  . History of blood transfusion    last transfusion was 04/04/2016=Benadryl was given d/t itching. States she always itches with transfusion.   Marland Kitchen History of bronchitis    > 2 yrs ago  . History of colon polyps    benign  . History of migraine    last one 05/01/16  . History of stomach ulcers   . History of urinary tract infection LAST 2 WEEKS AGO  . IDA (iron deficiency anemia)   . Internal and external bleeding hemorrhoids 06/11/2014  . Joint pain   . Joint swelling   . Left sided chronic colitis - segmental 06/11/2014  . Migraine   . Motion sickness   . Nausea    takes Zofran as needed  . Nausea and vomiting    for 1 year  . Obesity   . Oligouria   . Osteoarthritis    lower back, knees, wrists - no meds  . Pneumonia 1997  . Postoperative nausea and vomiting 01/21/2016   wants scopolamine patch  . SVD (spontaneous vaginal delivery)    x 4  . Tingling    BOTH LOWER EXTREMETIES ALL THE TIME DUE TO RAPID WEIGHT LOSS  . TOBACCO USER 10/02/2009   Qualifier: Diagnosis of  By: Dimas Millin MD, Ellard Artis    . Transfusion history    last transfusion 6'16   . UC (ulcerative colitis) ([place])    supposed to be taking Lialda and Bentyl but has been off since gastric sleeve  . Ulcerative colitis ([place])   . Vertigo    doesn't take any meds   Past Surgical History:  Procedure Laterality Date  . ABDOMINAL HYSTERECTOMY     PARTIAL  . COLONOSCOPY  2007   for rectal bleeding; [facility]  . COLONOSCOPY N/A 06/11/2014   Procedure: COLONOSCOPY;  Surgeon: Gatha Mayer, MD;  Location: [facility];  Service: Endoscopy;  Laterality: N/A;  . COLONOSCOPY WITH PROPOFOL N/A 03/02/2017   Procedure: COLONOSCOPY WITH PROPOFOL;  Surgeon: Alphonsa Overall, MD;  Location: [facility];  Service: General;  Laterality: N/A;  . DILATION AND CURETTAGE OF  UTERUS    . ESOPHAGOGASTRODUODENOSCOPY (EGD) WITH PROPOFOL N/A 04/06/2016   Procedure: ESOPHAGOGASTRODUODENOSCOPY (EGD) WITH PROPOFOL;  Surgeon: Alphonsa Overall, MD;  [facility]: [facility];  Service: General;  Laterality: N/A;  . ESOPHAGOGASTRODUODENOSCOPY (EGD) WITH PROPOFOL N/A 03/02/2017   Procedure: ESOPHAGOGASTRODUODENOSCOPY (EGD) WITH PROPOFOL;  Surgeon: Alphonsa Overall, MD;  Location: [facility];  Service: General;  Laterality: N/A;  . ESOPHAGOGASTRODUODENOSCOPY (EGD) WITH PROPOFOL N/A 05/20/2017   Procedure: ESOPHAGOGASTRODUODENOSCOPY (EGD) WITH PROPOFOL ERAS PATHWAY;  Surgeon: Alphonsa Overall, MD;  Location: [facility];  Service: General;  Laterality: N/A;  . ESOPHAGOGASTRODUODENOSCOPY (EGD) WITH PROPOFOL N/A 10/07/2017   Procedure: ESOPHAGOGASTRODUODENOSCOPY (EGD) WITH PROPOFOL;  Surgeon: Alphonsa Overall, MD;  Location: [facility];  Service: General;  Laterality: N/A;  . EXCISION OF SKIN TAG  06/08/2017   Procedure: EXCISION OF VULVAR SKIN TAGS X2;  Surgeon: [name], MD;  Location: [facility];  Service: Gynecology;;  . FOOT SURGERY Bilateral    x 2  . GASTRIC ROUX-EN-Y N/A 12/14/2016   Procedure: LAPAROSCOPIC REVISION SLEEVE GASTRECTOMY TO  ROUX-Y-GASTRIC BY-PASS, UPPER ENDO;  Surgeon: Excell Seltzer, MD;  Location: [facility];  Service: General;  Laterality: N/A;  . GASTROJEJUNOSTOMY N/A 05/11/2016   Procedure: LAPAROSCOPIC PLACEMENT  OF FEEDING  JEJUNOSTOMY TUBE;  Surgeon: Excell Seltzer, MD;  Location: [facility];  Service: General;  Laterality: N/A;  . HEMORRHOIDECTOMY WITH HEMORRHOID BANDING    . IR FLUORO GUIDE CV LINE RIGHT  04/06/2017  . IR GENERIC HISTORICAL  05/19/2016   IR CM INJ ANY COLONIC TUBE W/FLUORO 05/19/2016 Aletta Edouard, MD [facility]  . IR PATIENT EVAL TECH 0-60 MINS  11/30/2017  . IR REPLC DUODEN/JEJUNO TUBE PERCUT W/FLUORO  03/14/2018  . IR REPLC DUODEN/JEJUNO TUBE PERCUT W/FLUORO  03/15/2018  . IR US GUIDE VASC ACCESS RIGHT  04/06/2017  . j tube removed march 2018    .  KNEE ARTHROSCOPY Left 09/06/2014  . LAPAROSCOPIC GASTRIC SLEEVE RESECTION N/A 01/14/2016   Procedure: LAPAROSCOPIC GASTRIC SLEEVE RESECTION;  Surgeon: Excell Seltzer, MD;  Location: [facility];  Service: General;  Laterality: N/A;  . LAPAROSCOPIC REVISION OF GASTROJEJUNOSTOMY N/A 10/25/2017   Procedure: LAPAROSCOPIC REVISION OF GASTROJEJUNOSTOMY AND PARTIAL GASTRECTOMY, WITH PLACEMENT OF FEEDING GASTROSTOMY TUBE;  Surgeon: Excell Seltzer, MD;  Location: [facility];  Service: General;  Laterality: N/A;  . LAPAROSCOPIC TUBAL LIGATION  10/16/2011   Procedure: LAPAROSCOPIC TUBAL LIGATION;  Surgeon: Alwyn Pea, MD;  Location: [facility];  Service: Gynecology;  Laterality: Bilateral;  . NOVASURE ABLATION  09/28/2010   mild persistent vaginal bleeding  . right knee arthroscopy     05/07/2016  . TOTAL KNEE ARTHROPLASTY Left 03/10/2018   Procedure: LEFT TOTAL KNEE ARTHROPLASTY;  Surgeon: Paralee Cancel, MD;  Location: [facility];  Service: Orthopedics;  Laterality: Left;  70 mins  . TUBAL LIGATION    . VAGINAL HYSTERECTOMY Bilateral 06/08/2017   Procedure: HYSTERECTOMY VAGINAL W/ BILATERAL SALPINGECTOMY;  Surgeon: [name], MD;  Location: [facility];  Service: Gynecology;  Laterality: Bilateral;  . wisdom teeth extracted     Allergies  Allergen Reactions  . Coconut Oil Anaphylaxis and Itching  . Morphine And Related Anaphylaxis and Hives    Pt states that she has tolerated Norco and Dilaudid  . Oxycodone Anaphylaxis  . Ciprofloxacin Itching and Rash  . Doxycycline Nausea And Vomiting  . Adhesive [Tape] Rash    And paper tape causes a rash if wearing for a prolong period of time  . Penicillins Rash    Has patient had a PCN reaction causing immediate rash, facial/tongue/throat swelling, SOB or lightheadedness with hypotension: Yes Has patient had a PCN reaction causing severe rash involving mucus membranes or skin necrosis: No Has patient had a PCN reaction that required hospitalization No Has patient had  a PCN reaction occurring within the last 10 years: No If all of the above answers are "NO", then may proceed with Cephalosporin use.   Current Outpatient Medications on File Prior to Visit  Medication Sig Dispense Refill  . albuterol (PROVENTIL HFA;VENTOLIN HFA) 108 (90 Base) MCG/ACT inhaler Inhale 2 puffs into the lungs every 6 (six) hours as needed for wheezing or shortness of breath. For shortness of breath (Patient taking differently: Inhale 2 puffs into the lungs every 6 (six) hours as needed for wheezing or shortness of breath. ) 54 g 0  . CALCIUM CITRATE PO 10 mLs by Feeding Tube route every other day. In the afternoon.    . docusate sodium (COLACE) 100 MG capsule Take 1 capsule (100 mg total) by mouth 2 (two) times daily. 10 capsule 0  . enoxaparin (LOVENOX) 40 MG/0.4ML injection Inject 0.4 mLs (40 mg  total) into the skin daily. 14 Syringe 0  . ferrous sulfate (FERROUSUL) 325 (65 FE) MG tablet Take 1 tablet (325 mg total) by mouth 3 (three) times daily with meals.  3  . HYDROcodone-acetaminophen (NORCO) 10-325 MG tablet Take 1-2 tablets by mouth every 4 (four) hours as needed for moderate pain or severe pain (1 pill pain scale 1-5, 2 pill pain scale 6-10). 60 tablet 0  . linaclotide (LINZESS) 290 MCG CAPS capsule Take 1 capsule (290 mcg total) by mouth daily before breakfast. 90 capsule 1  . methocarbamol (ROBAXIN) 500 MG tablet Take 1 tablet (500 mg total) by mouth every 6 (six) hours as needed for muscle spasms. 40 tablet 0  . Multiple Vitamin (MULTIVITAMIN) LIQD Place 10 mLs into feeding tube daily. Bariatric Liquid Multivitamin     . Nutritional Supplements (VITAL HP 1.0 CAL) LIQD 65 mL/hr by Feeding Tube route continuous.    . ondansetron (ZOFRAN-ODT) 8 MG disintegrating tablet Take 1 tablet (8 mg total) by mouth every 8 (eight) hours as needed for nausea. 20 tablet 5  . pantoprazole (PROTONIX) 40 MG tablet Take 1 tablet (40 mg total) by mouth 2 (two) times daily before a meal. 180  tablet 1  . polyethylene glycol (MIRALAX / GLYCOLAX) packet Take 17 g by mouth 2 (two) times daily. 14 each 0  . thiamine (VITAMIN B-1) 100 MG tablet Take 100 mg by mouth daily at 12 noon.     . Vitamin D, Ergocalciferol, (DRISDOL) 50000 units CAPS capsule TAKE 1 CAPSULE (50,000 UNITS TOTAL) BY MOUTH EVERY 7 (SEVEN) DAYS. 12 capsule 0   Current Facility-Administered Medications on File Prior to Visit  Medication Dose Route Frequency Provider Last Rate Last Dose  . cyanocobalamin ((VITAMIN B-12)) injection 1,000 mcg  1,000 mcg Intramuscular Q30 days Wardell Honour, MD   1,000 mcg at 06/22/17 1537   Social History   Socioeconomic History  . Marital status: Single    Spouse name: Not on file  . Number of children: 3  . Years of education: Some college  . Highest education level: Not on file  Occupational History  . Occupation: Production designer, theatre/television/film    Comment: Currently out on disability d/t surg  Social Needs  . Financial resource strain: Not on file  . Food insecurity:    Worry: Not on file    Inability: Not on file  . Transportation needs:    Medical: Not on file    Non-medical: Not on file  Tobacco Use  . Smoking status: Former Smoker    Packs/day: 0.50    Years: 20.00    Pack years: 10.00    Types: Cigarettes    Last attempt to quit: 03/17/2014    Years since quitting: 4.0  . Smokeless tobacco: Never Used  Substance and Sexual Activity  . Alcohol use: No    Alcohol/week: 0.0 oz  . Drug use: No  . Sexual activity: Not Currently    Birth control/protection: Surgical, Abstinence  Lifestyle  . Physical activity:    Days per week: Not on file    Minutes per session: Not on file  . Stress: Not on file  Relationships  . Social connections:    Talks on phone: Not on file    Gets together: Not on file    Attends religious service: Not on file    Active member of club or organization: Not on file    Attends meetings of clubs or organizations: Not on file  Relationship  status: Not on file  . Intimate partner violence:    Fear of current or ex partner: Not on file    Emotionally abused: Not on file    Physically abused: Not on file    Forced sexual activity: Not on file  Other Topics Concern  . Not on file  Social History Narrative   Marital status:  Single; not dating seriously.      Children:  3 sons (81, 24, 65) whose father is deceased, 1 daughter (deceased); no grandchildren      Lives:  With 2 sons      Employment:  Long term disability in 2018 due to PICC line for iv fluids; Corporate treasurer at [employer]      Tobacco: quit June 2015      Alcohol:  None      Drugs:  None      Exercise:  Leg lifts for knee strengthening; walking   1 caffeine beverage daily      Sexual activity:  Active; total partners = up there.  No STDs in past.        Right-handed   Family History  Problem Relation Age of Onset  . Hypertension Mother   . Diabetes Mother   . Sarcoidosis Mother        lungs and skin  . Asthma Mother   . Hypertension Father   . Diabetes Father   . Asthma Son   . Tics Son   . Asthma Sister   . Cancer Sister        possible pancreatic cancer  . Adrenal disorder Sister        Tumor   . Asthma Brother   . Asthma Daughter        died age 53.5  . Cancer Daughter 4       brain; died age 53.5  . Asthma Son   . Cancer Paternal Aunt        brain, colon, lung and esophagus; unsure of primary        Objective:    BP 110/78   Pulse (!) 109   Temp 98.6 F (37 C) (Oral)   Resp 18   Ht 5' 7"  (1.702 m)   Wt 185 lb 6.4 oz (84.1 kg)   LMP 06/02/2017 Comment: hysterectomy 06/08/2017  SpO2 97%   BMI 29.04 kg/m  Physical Exam  Constitutional: She is oriented to person, place, and time. She appears well-developed and well-nourished. No distress.  HENT:  Head: Normocephalic and atraumatic.  Right Ear: External ear normal.  Left Ear: External ear normal.  Nose: Nose normal.  Mouth/Throat: Oropharynx is clear and moist.  Eyes:  Pupils are equal, round, and reactive to light. Conjunctivae and EOM are normal.  Neck: Normal range of motion. Neck supple. Carotid bruit is not present. No thyromegaly present.  Cardiovascular: Normal rate, regular rhythm, normal heart sounds and intact distal pulses. Exam reveals no gallop and no friction rub.  No murmur heard. Pulmonary/Chest: Effort normal and breath sounds normal. She has no wheezes. She has no rales.  Abdominal: Soft. Bowel sounds are normal. She exhibits no distension and no mass. There is no tenderness. There is no rebound and no guarding.  Feeding tube intact without erythema.  Lymphadenopathy:    She has no cervical adenopathy.  Neurological: She is alert and oriented to person, place, and time. No cranial nerve deficit.  Skin: Skin is warm and dry. No rash noted. She is  not diaphoretic. No erythema. No pallor.  Psychiatric: She has a normal mood and affect. Her behavior is normal. Judgment and thought content normal.   No results found. Depression screen [facility] 2/9 02/28/2018 01/26/2018 12/22/2017 12/15/2017 09/22/2017  Decreased Interest 0 2 1 3 1   Down, Depressed, Hopeless 0 2 1 3 1   PHQ - 2 Score 0 4 2 6 2   Altered sleeping - 1 - 3 1  Tired, decreased energy - 1 - 3 1  Change in appetite - 1 - 3 0  Feeling bad or failure about yourself  - 1 - 3 1  Trouble concentrating - - - 3 0  Moving slowly or fidgety/restless - - - 3 0  Suicidal thoughts - - - 0 0  PHQ-9 Score - 8 - 24 5  Difficult doing work/chores - - - - -  Some recent data might be hidden   Fall Risk  03/30/2018 02/28/2018 01/26/2018 01/01/2018 12/22/2017  Falls in the past year? Yes No No Yes No  Number falls in past yr: 2 or more - - 2 or more -  Injury with Fall? No - - Yes -  Comment - - - - -  Follow up - - - - -        Assessment & Plan:   1. Iron deficiency anemia secondary to inadequate dietary iron intake   2. B12 deficiency   3. Malnutrition, calorie ([place])   4. Postoperative anemia due to  acute blood loss   5. S/P left TKA   6. S/P laparoscopic sleeve gastrectomy   7. Reactive depression     Status post total knee replacement on left: Doing very well postoperatively.  Managed by orthopedics.  Iron deficiency anemia: Improved.  Status post blood transfusion with knee replacement during hospitalization.  Reactive depression: Worsening due to recent total knee replacement.  Increase fluoxetine to 40 mg daily continue psychotherapy.  Malnutrition due to complications bariatric surgery: Continues to receive tube feedings.  Status post replacement of gastric tube during admission for total knee replacement.  Has upcoming appointment with general surgery in the upcoming several weeks.  Weight has improved and has stabilized.  Vitamin B12 deficiency: Status post B12 injection during office visit.  Consider weaning B12 injections to every other week in the upcoming 6 months once weight nutritional status has stabilized.  Orders Placed This Encounter  Procedures  . CBC with Differential/Platelet  . Iron  . Comprehensive metabolic panel   Meds ordered this encounter  Medications  . FLUoxetine (PROZAC) 20 MG tablet    Sig: Take 2 tablets (40 mg total) by mouth daily.    Dispense:  180 tablet    Refill:  1  . cyanocobalamin ((VITAMIN B-12)) injection 1,000 mcg    Return in about 3 weeks (around 04/20/2018) for recheck.   Joselyne Spake Elayne Guerin, M.D. Primary Care at [facility] [address] [place], [place]  [address] [phone] phone [phone] fax

## 2018-03-26 NOTE — Telephone Encounter (Signed)
LMOVM for [facility] PT re: pt's blood pressure

## 2018-03-27 DIAGNOSIS — K9423 Gastrostomy malfunction: Secondary | ICD-10-CM | POA: Diagnosis not present

## 2018-03-27 DIAGNOSIS — R112 Nausea with vomiting, unspecified: Secondary | ICD-10-CM | POA: Diagnosis not present

## 2018-03-27 DIAGNOSIS — K922 Gastrointestinal hemorrhage, unspecified: Secondary | ICD-10-CM | POA: Diagnosis not present

## 2018-03-28 DIAGNOSIS — K9423 Gastrostomy malfunction: Secondary | ICD-10-CM | POA: Diagnosis not present

## 2018-03-28 DIAGNOSIS — Z471 Aftercare following joint replacement surgery: Secondary | ICD-10-CM | POA: Diagnosis not present

## 2018-03-28 DIAGNOSIS — K289 Gastrojejunal ulcer, unspecified as acute or chronic, without hemorrhage or perforation: Secondary | ICD-10-CM | POA: Diagnosis not present

## 2018-03-30 ENCOUNTER — Other Ambulatory Visit: Payer: Self-pay

## 2018-03-30 ENCOUNTER — Encounter: Payer: Self-pay | Admitting: Family Medicine

## 2018-03-30 ENCOUNTER — Ambulatory Visit (INDEPENDENT_AMBULATORY_CARE_PROVIDER_SITE_OTHER): Payer: 59 | Admitting: Family Medicine

## 2018-03-30 VITALS — BP 110/78 | HR 109 | Temp 98.6°F | Resp 18 | Ht 67.0 in | Wt 185.4 lb

## 2018-03-30 DIAGNOSIS — D508 Other iron deficiency anemias: Secondary | ICD-10-CM

## 2018-03-30 DIAGNOSIS — D62 Acute posthemorrhagic anemia: Secondary | ICD-10-CM | POA: Diagnosis not present

## 2018-03-30 DIAGNOSIS — Z9884 Bariatric surgery status: Secondary | ICD-10-CM

## 2018-03-30 DIAGNOSIS — E538 Deficiency of other specified B group vitamins: Secondary | ICD-10-CM | POA: Diagnosis not present

## 2018-03-30 DIAGNOSIS — Z96652 Presence of left artificial knee joint: Secondary | ICD-10-CM | POA: Diagnosis not present

## 2018-03-30 DIAGNOSIS — F329 Major depressive disorder, single episode, unspecified: Secondary | ICD-10-CM

## 2018-03-30 DIAGNOSIS — E46 Unspecified protein-calorie malnutrition: Secondary | ICD-10-CM | POA: Diagnosis not present

## 2018-03-30 MED ORDER — FLUOXETINE HCL 20 MG PO TABS
40.0000 mg | ORAL_TABLET | Freq: Every day | ORAL | 1 refills | Status: DC
Start: 2018-03-30 — End: 2018-05-24

## 2018-03-30 MED ORDER — CYANOCOBALAMIN 1000 MCG/ML IJ SOLN
1000.0000 ug | INTRAMUSCULAR | Status: DC
  Administered 2018-03-30 – 2018-07-15 (×2): 1000 ug via INTRAMUSCULAR

## 2018-03-30 NOTE — Patient Instructions (Addendum)
  Increase Fluoxetine to 74m daily.   IF you received an x-ray today, you will [facility]. Please contact [facility] at [phone] questions or concerns regarding your invoice.   IF you received labwork today, you will receive an invoice from [facility] Please contact [facility] at [phone] questions or concerns regarding your invoice.   Our billing staff will not be able to assist you with questions regarding bills from these companies.  You will be contacted with the lab results as soon as they are available. The fastest way to get your results is to activate your My Chart account. Instructions are located on the last page of this paperwork. If you have not heard from [facility] the results in 2 weeks, please contact this office.

## 2018-03-31 LAB — CBC WITH DIFFERENTIAL/PLATELET
BASOS ABS: 0 10*3/uL (ref 0.0–0.2)
Basos: 0 %
EOS (ABSOLUTE): 0.5 10*3/uL — AB (ref 0.0–0.4)
Eos: 7 %
HEMOGLOBIN: 11.8 g/dL (ref 11.1–15.9)
Hematocrit: 37.6 % (ref 34.0–46.6)
IMMATURE GRANS (ABS): 0 10*3/uL (ref 0.0–0.1)
Immature Granulocytes: 0 %
LYMPHS: 49 %
Lymphocytes Absolute: 3.8 10*3/uL — ABNORMAL HIGH (ref 0.7–3.1)
MCH: 24.6 pg — AB (ref 26.6–33.0)
MCHC: 31.4 g/dL — ABNORMAL LOW (ref 31.5–35.7)
MCV: 78 fL — ABNORMAL LOW (ref 79–97)
MONOCYTES: 5 %
Monocytes Absolute: 0.4 10*3/uL (ref 0.1–0.9)
NEUTROS ABS: 3 10*3/uL (ref 1.4–7.0)
Neutrophils: 39 %
PLATELETS: 486 10*3/uL — AB (ref 150–450)
RBC: 4.8 x10E6/uL (ref 3.77–5.28)
RDW: 18.7 % — ABNORMAL HIGH (ref 12.3–15.4)
WBC: 7.8 10*3/uL (ref 3.4–10.8)

## 2018-03-31 LAB — COMPREHENSIVE METABOLIC PANEL
A/G RATIO: 1.3 (ref 1.2–2.2)
ALT: 15 IU/L (ref 0–32)
AST: 15 IU/L (ref 0–40)
Albumin: 4.5 g/dL (ref 3.5–5.5)
Alkaline Phosphatase: 71 IU/L (ref 39–117)
BUN/Creatinine Ratio: 18 (ref 9–23)
BUN: 15 mg/dL (ref 6–24)
Bilirubin Total: 0.3 mg/dL (ref 0.0–1.2)
CALCIUM: 9.9 mg/dL (ref 8.7–10.2)
CHLORIDE: 103 mmol/L (ref 96–106)
CO2: 21 mmol/L (ref 20–29)
Creatinine, Ser: 0.82 mg/dL (ref 0.57–1.00)
GFR calc Af Amer: 104 mL/min/{1.73_m2} (ref >59)
GFR, EST NON AFRICAN AMERICAN: 90 mL/min/{1.73_m2} (ref >59)
GLUCOSE: 68 mg/dL (ref 65–99)
Globulin, Total: 3.5 g/dL (ref 1.5–4.5)
POTASSIUM: 4.9 mmol/L (ref 3.5–5.2)
Sodium: 138 mmol/L (ref 134–144)
TOTAL PROTEIN: 8 g/dL (ref 6.0–8.5)

## 2018-03-31 LAB — IRON: Iron: 115 ug/dL (ref 27–159)

## 2018-04-01 DIAGNOSIS — K9423 Gastrostomy malfunction: Secondary | ICD-10-CM | POA: Diagnosis not present

## 2018-04-01 DIAGNOSIS — Z471 Aftercare following joint replacement surgery: Secondary | ICD-10-CM | POA: Diagnosis not present

## 2018-04-01 DIAGNOSIS — K289 Gastrojejunal ulcer, unspecified as acute or chronic, without hemorrhage or perforation: Secondary | ICD-10-CM | POA: Diagnosis not present

## 2018-04-04 DIAGNOSIS — R269 Unspecified abnormalities of gait and mobility: Secondary | ICD-10-CM | POA: Diagnosis not present

## 2018-04-04 DIAGNOSIS — K922 Gastrointestinal hemorrhage, unspecified: Secondary | ICD-10-CM | POA: Diagnosis not present

## 2018-04-04 DIAGNOSIS — Z9884 Bariatric surgery status: Secondary | ICD-10-CM | POA: Diagnosis not present

## 2018-04-05 DIAGNOSIS — K289 Gastrojejunal ulcer, unspecified as acute or chronic, without hemorrhage or perforation: Secondary | ICD-10-CM | POA: Diagnosis not present

## 2018-04-05 DIAGNOSIS — K9423 Gastrostomy malfunction: Secondary | ICD-10-CM | POA: Diagnosis not present

## 2018-04-05 DIAGNOSIS — Z471 Aftercare following joint replacement surgery: Secondary | ICD-10-CM | POA: Diagnosis not present

## 2018-04-07 DIAGNOSIS — K289 Gastrojejunal ulcer, unspecified as acute or chronic, without hemorrhage or perforation: Secondary | ICD-10-CM | POA: Diagnosis not present

## 2018-04-07 DIAGNOSIS — K9423 Gastrostomy malfunction: Secondary | ICD-10-CM | POA: Diagnosis not present

## 2018-04-07 DIAGNOSIS — Z471 Aftercare following joint replacement surgery: Secondary | ICD-10-CM | POA: Diagnosis not present

## 2018-04-11 DIAGNOSIS — M25562 Pain in left knee: Secondary | ICD-10-CM | POA: Diagnosis not present

## 2018-04-12 ENCOUNTER — Telehealth: Payer: Self-pay | Admitting: Skilled Nursing Facility1

## 2018-04-12 NOTE — Telephone Encounter (Signed)
On vital high protein rate of 50 dialing it down her self stating Dr Excell Seltzer changed it to 30 so she could feel hungry enough to eat.  Trying to drink the protein shakes: 4 ounces a day with nausea stating it is tolerable nausea.   Still throwing up and nausea right after drinking/eting.  Pt states she can tolerate 4 saltine crackers without vomiting.

## 2018-04-13 DIAGNOSIS — M25562 Pain in left knee: Secondary | ICD-10-CM | POA: Diagnosis not present

## 2018-04-15 DIAGNOSIS — M25562 Pain in left knee: Secondary | ICD-10-CM | POA: Diagnosis not present

## 2018-04-18 DIAGNOSIS — M25562 Pain in left knee: Secondary | ICD-10-CM | POA: Diagnosis not present

## 2018-04-20 ENCOUNTER — Telehealth: Payer: Self-pay | Admitting: Skilled Nursing Facility1

## 2018-04-20 DIAGNOSIS — M7062 Trochanteric bursitis, left hip: Secondary | ICD-10-CM | POA: Diagnosis not present

## 2018-04-20 DIAGNOSIS — Z96652 Presence of left artificial knee joint: Secondary | ICD-10-CM | POA: Diagnosis not present

## 2018-04-20 DIAGNOSIS — M25562 Pain in left knee: Secondary | ICD-10-CM | POA: Diagnosis not present

## 2018-04-20 DIAGNOSIS — Z471 Aftercare following joint replacement surgery: Secondary | ICD-10-CM | POA: Diagnosis not present

## 2018-04-20 DIAGNOSIS — M7061 Trochanteric bursitis, right hip: Secondary | ICD-10-CM | POA: Diagnosis not present

## 2018-04-20 NOTE — Telephone Encounter (Signed)
Dietitian spoke with [facility] dietitian who wanted to clarify if pt should stay on vital. Dietitian stated no she can be brought down to the osmelite but still meeting 100% of her needs with the TF due to pts intolerance po. Home care dietitian asked if there was weight goal, dietitian stated no as long as the pts signs/symptoms had subsided from malnutrition dietitian felt confident in enteral feed dietitian to choose the formula and rate.   Dietitian stated there should be a slight decline 10-15 pounds of weight and then mainataince to determine if formula was correct for pt.

## 2018-04-22 ENCOUNTER — Ambulatory Visit (INDEPENDENT_AMBULATORY_CARE_PROVIDER_SITE_OTHER): Payer: 59 | Admitting: Family Medicine

## 2018-04-22 ENCOUNTER — Encounter: Payer: Self-pay | Admitting: Family Medicine

## 2018-04-22 ENCOUNTER — Other Ambulatory Visit: Payer: Self-pay

## 2018-04-22 VITALS — BP 116/70 | HR 87 | Temp 98.0°F | Resp 16 | Ht 67.72 in | Wt 185.0 lb

## 2018-04-22 DIAGNOSIS — Z9884 Bariatric surgery status: Secondary | ICD-10-CM

## 2018-04-22 DIAGNOSIS — E559 Vitamin D deficiency, unspecified: Secondary | ICD-10-CM

## 2018-04-22 DIAGNOSIS — E538 Deficiency of other specified B group vitamins: Secondary | ICD-10-CM

## 2018-04-22 DIAGNOSIS — M25562 Pain in left knee: Secondary | ICD-10-CM | POA: Diagnosis not present

## 2018-04-22 DIAGNOSIS — Z96652 Presence of left artificial knee joint: Secondary | ICD-10-CM

## 2018-04-22 DIAGNOSIS — D508 Other iron deficiency anemias: Secondary | ICD-10-CM

## 2018-04-22 DIAGNOSIS — R252 Cramp and spasm: Secondary | ICD-10-CM

## 2018-04-22 DIAGNOSIS — G43A1 Cyclical vomiting, intractable: Secondary | ICD-10-CM

## 2018-04-22 DIAGNOSIS — R1115 Cyclical vomiting syndrome unrelated to migraine: Secondary | ICD-10-CM

## 2018-04-22 DIAGNOSIS — E46 Unspecified protein-calorie malnutrition: Secondary | ICD-10-CM

## 2018-04-22 DIAGNOSIS — F329 Major depressive disorder, single episode, unspecified: Secondary | ICD-10-CM

## 2018-04-22 NOTE — Progress Notes (Signed)
Subjective:    Patient ID: ETERNITY DEXTER, female    DOB: 05-06-1976, 42 y.o.   MRN: [id number]  04/22/2018  Chronic Conditions (1 month follow-up )    HPI This 42 y.o. female presents for [place].   Not sleeping; pain is interfering with sleep; knee pain is horrible. S/p B hip injections by ortho.  Horrible cramping.  R leg is starting to hurt.  Physical therapy three times per week.  Six weeks since TKR. Had another allergic reaction. Had another allergic reaction on L arm, chest.  Last week. Constipated; not having daily bowel movements. Mother lives next door.   Mood is down; bad month due to daughter's death in 05-19-2023.  Passed away in 12-19-2001.    BP Readings from Last 3 Encounters:  04/22/18 116/70  03/30/18 110/78  03/15/18 106/71   Wt Readings from Last 3 Encounters:  04/22/18 185 lb (83.9 kg)  03/30/18 185 lb 6.4 oz (84.1 kg)  03/15/18 202 lb (91.6 kg)   Immunization History  Administered Date(s) Administered  . Influenza,inj,Quad PF,6+ Mos 09/02/2015, 07/21/2016, 05/19/2017  . Influenza-Unspecified 07/30/2014  . Tdap 05/18/2015    Review of Systems  Constitutional: Negative for activity change, appetite change, chills, diaphoresis, fatigue, fever and unexpected weight change.  HENT: Negative for congestion, dental problem, drooling, ear discharge, ear pain, facial swelling, hearing loss, mouth sores, nosebleeds, postnasal drip, rhinorrhea, sinus pressure, sneezing, sore throat, tinnitus, trouble swallowing and voice change.   Eyes: Negative for photophobia, pain, discharge, redness, itching and visual disturbance.  Respiratory: Negative for apnea, cough, choking, chest tightness, shortness of breath, wheezing and stridor.   Cardiovascular: Negative for chest pain, palpitations and leg swelling.  Gastrointestinal: Positive for constipation and nausea. Negative for abdominal distention, abdominal pain, anal bleeding, blood in stool, diarrhea, rectal pain  and vomiting.  Endocrine: Negative for cold intolerance, heat intolerance, polydipsia, polyphagia and polyuria.  Genitourinary: Negative for decreased urine volume, difficulty urinating, dyspareunia, dysuria, enuresis, flank pain, frequency, genital sores, hematuria, menstrual problem, pelvic pain, urgency, vaginal bleeding, vaginal discharge and vaginal pain.  Musculoskeletal: Positive for arthralgias and gait problem. Negative for back pain, joint swelling, myalgias, neck pain and neck stiffness.  Skin: Negative for color change, pallor, rash and wound.  Allergic/Immunologic: Negative for environmental allergies, food allergies and immunocompromised state.  Neurological: Negative for dizziness, tremors, seizures, syncope, facial asymmetry, speech difficulty, weakness, light-headedness, numbness and headaches.  Hematological: Negative for adenopathy. Does not bruise/bleed easily.  Psychiatric/Behavioral: Positive for dysphoric mood and sleep disturbance. Negative for agitation, behavioral problems, confusion, decreased concentration, hallucinations, self-injury and suicidal ideas. The patient is not nervous/anxious and is not hyperactive.     Past Medical History:  Diagnosis Date  . Anal fissure - posterior 10/16/2014    OCC  ISSUES  . Anxiety    doesn't take anything  . Asthma    has Albuterol inhaler as needed  . Boil    on pubic area started septra 03-01-17 draining blood and pus  . Bronchitis   . Chronic headache disorder 07/22/2016  . Clostridium difficile infection 04/20/2012  . Colitis   . Dehydration   . Depression   . Family history of adverse reaction to anesthesia    pt mom gets sick  . GERD (gastroesophageal reflux disease)    takes Pantoprazole daily  . Headache   . Hiatal hernia    neuropathy - mild in arms and legs  . History of blood transfusion    last transfusion  was 04/04/2016=Benadryl was given d/t itching. States she always itches with transfusion.   Marland Kitchen History of  bronchitis    > 2 yrs ago  . History of colon polyps    benign  . History of migraine    last one 05/01/16  . History of stomach ulcers   . History of urinary tract infection LAST 2 WEEKS AGO  . IDA (iron deficiency anemia)   . Internal and external bleeding hemorrhoids 06/11/2014  . Joint pain   . Joint swelling   . Left sided chronic colitis - segmental 06/11/2014  . Migraine   . Motion sickness   . Nausea    takes Zofran as needed  . Nausea and vomiting    for 1 year  . Obesity   . Oligouria   . Osteoarthritis    lower back, knees, wrists - no meds  . Pneumonia 1997  . Postoperative nausea and vomiting 01/21/2016   wants scopolamine patch  . SVD (spontaneous vaginal delivery)    x 4  . Tingling    BOTH LOWER EXTREMETIES ALL THE TIME DUE TO RAPID WEIGHT LOSS  . TOBACCO USER 10/02/2009   Qualifier: Diagnosis of  By: Dimas Millin MD, Ellard Artis    . Transfusion history    last transfusion 6'16   . UC (ulcerative colitis) ([place])    supposed to be taking Lialda and Bentyl but has been off since gastric sleeve  . Ulcerative colitis ([place])   . Vertigo    doesn't take any meds   Past Surgical History:  Procedure Laterality Date  . ABDOMINAL HYSTERECTOMY     PARTIAL  . COLONOSCOPY  2007   for rectal bleeding; [facility]  . COLONOSCOPY N/A 06/11/2014   Procedure: COLONOSCOPY;  Surgeon: Gatha Mayer, MD;  Location: [facility];  Service: Endoscopy;  Laterality: N/A;  . COLONOSCOPY WITH PROPOFOL N/A 03/02/2017   Procedure: COLONOSCOPY WITH PROPOFOL;  Surgeon: Alphonsa Overall, MD;  Location: [facility];  Service: General;  Laterality: N/A;  . DILATION AND CURETTAGE OF UTERUS    . ESOPHAGOGASTRODUODENOSCOPY (EGD) WITH PROPOFOL N/A 04/06/2016   Procedure: ESOPHAGOGASTRODUODENOSCOPY (EGD) WITH PROPOFOL;  Surgeon: Alphonsa Overall, MD;  [facility]: [facility];  Service: General;  Laterality: N/A;  . ESOPHAGOGASTRODUODENOSCOPY (EGD) WITH PROPOFOL N/A 03/02/2017   Procedure: ESOPHAGOGASTRODUODENOSCOPY (EGD)  WITH PROPOFOL;  Surgeon: Alphonsa Overall, MD;  Location: [facility];  Service: General;  Laterality: N/A;  . ESOPHAGOGASTRODUODENOSCOPY (EGD) WITH PROPOFOL N/A 05/20/2017   Procedure: ESOPHAGOGASTRODUODENOSCOPY (EGD) WITH PROPOFOL ERAS PATHWAY;  Surgeon: Alphonsa Overall, MD;  Location: [facility];  Service: General;  Laterality: N/A;  . ESOPHAGOGASTRODUODENOSCOPY (EGD) WITH PROPOFOL N/A 10/07/2017   Procedure: ESOPHAGOGASTRODUODENOSCOPY (EGD) WITH PROPOFOL;  Surgeon: Alphonsa Overall, MD;  Location: [facility];  Service: General;  Laterality: N/A;  . EXCISION OF SKIN TAG  06/08/2017   Procedure: EXCISION OF VULVAR SKIN TAGS X2;  Surgeon: [name], MD;  Location: [facility];  Service: Gynecology;;  . FOOT SURGERY Bilateral    x 2  . GASTRIC ROUX-EN-Y N/A 12/14/2016   Procedure: LAPAROSCOPIC REVISION SLEEVE GASTRECTOMY TO  ROUX-Y-GASTRIC BY-PASS, UPPER ENDO;  Surgeon: Excell Seltzer, MD;  Location: [facility];  Service: General;  Laterality: N/A;  . GASTROJEJUNOSTOMY N/A 05/11/2016   Procedure: LAPAROSCOPIC PLACEMENT  OF FEEDING  JEJUNOSTOMY TUBE;  Surgeon: Excell Seltzer, MD;  Location: [facility];  Service: General;  Laterality: N/A;  . HEMORRHOIDECTOMY WITH HEMORRHOID BANDING    . IR FLUORO GUIDE CV LINE RIGHT  04/06/2017  .  IR GENERIC HISTORICAL  05/19/2016   IR CM INJ ANY COLONIC TUBE W/FLUORO 05/19/2016 Aletta Edouard, MD [facility]  . IR PATIENT EVAL TECH 0-60 MINS  11/30/2017  . IR REPLC DUODEN/JEJUNO TUBE PERCUT W/FLUORO  03/14/2018  . IR REPLC DUODEN/JEJUNO TUBE PERCUT W/FLUORO  03/15/2018  . IR US GUIDE VASC ACCESS RIGHT  04/06/2017  . j tube removed march 2018    . KNEE ARTHROSCOPY Left 09/06/2014  . LAPAROSCOPIC GASTRIC SLEEVE RESECTION N/A 01/14/2016   Procedure: LAPAROSCOPIC GASTRIC SLEEVE RESECTION;  Surgeon: Excell Seltzer, MD;  [facility]: [facility];  Service: General;  Laterality: N/A;  . LAPAROSCOPIC REVISION OF GASTROJEJUNOSTOMY N/A 10/25/2017   Procedure: LAPAROSCOPIC REVISION OF  GASTROJEJUNOSTOMY AND PARTIAL GASTRECTOMY, WITH PLACEMENT OF FEEDING GASTROSTOMY TUBE;  Surgeon: Excell Seltzer, MD;  Location: [facility];  Service: General;  Laterality: N/A;  . LAPAROSCOPIC TUBAL LIGATION  10/16/2011   Procedure: LAPAROSCOPIC TUBAL LIGATION;  Surgeon: Alwyn Pea, MD;  Location: [facility];  Service: Gynecology;  Laterality: Bilateral;  . NOVASURE ABLATION  09/28/2010   mild persistent vaginal bleeding  . right knee arthroscopy     05/07/2016  . TOTAL KNEE ARTHROPLASTY Left 03/10/2018   Procedure: LEFT TOTAL KNEE ARTHROPLASTY;  Surgeon: Paralee Cancel, MD;  Location: [facility];  Service: Orthopedics;  Laterality: Left;  70 mins  . TUBAL LIGATION    . VAGINAL HYSTERECTOMY Bilateral 06/08/2017   Procedure: HYSTERECTOMY VAGINAL W/ BILATERAL SALPINGECTOMY;  Surgeon: [name], MD;  Location: [facility];  Service: Gynecology;  Laterality: Bilateral;  . wisdom teeth extracted     Allergies  Allergen Reactions  . Coconut Oil Anaphylaxis and Itching  . Morphine And Related Anaphylaxis and Hives    Pt states that she has tolerated Norco and Dilaudid  . Oxycodone Anaphylaxis  . Ciprofloxacin Itching and Rash  . Doxycycline Nausea And Vomiting  . Adhesive [Tape] Rash    And paper tape causes a rash if wearing for a prolong period of time  . Penicillins Rash    Has patient had a PCN reaction causing immediate rash, facial/tongue/throat swelling, SOB or lightheadedness with hypotension: Yes Has patient had a PCN reaction causing severe rash involving mucus membranes or skin necrosis: No Has patient had a PCN reaction that required hospitalization No Has patient had a PCN reaction occurring within the last 10 years: No If all of the above answers are "NO", then may proceed with Cephalosporin use.   Current Outpatient Medications on File Prior to Visit  Medication Sig Dispense Refill  . albuterol (PROVENTIL HFA;VENTOLIN HFA) 108 (90 Base) MCG/ACT inhaler Inhale 2 puffs into the lungs  every 6 (six) hours as needed for wheezing or shortness of breath. For shortness of breath (Patient taking differently: Inhale 2 puffs into the lungs every 6 (six) hours as needed for wheezing or shortness of breath. ) 54 g 0  . CALCIUM CITRATE PO 10 mLs by Feeding Tube route every other day. In the afternoon.    . docusate sodium (COLACE) 100 MG capsule Take 1 capsule (100 mg total) by mouth 2 (two) times daily. 10 capsule 0  . ferrous sulfate (FERROUSUL) 325 (65 FE) MG tablet Take 1 tablet (325 mg total) by mouth 3 (three) times daily with meals.  3  . FLUoxetine (PROZAC) 20 MG tablet Take 2 tablets (40 mg total) by mouth daily. 180 tablet 1  . gabapentin (NEURONTIN) 300 MG capsule gabapentin 300 mg capsule  1 po tid    . HYDROcodone-acetaminophen (  NORCO) 10-325 MG tablet Take 1-2 tablets by mouth every 4 (four) hours as needed for moderate pain or severe pain (1 pill pain scale 1-5, 2 pill pain scale 6-10). 60 tablet 0  . linaclotide (LINZESS) 290 MCG CAPS capsule Take 1 capsule (290 mcg total) by mouth daily before breakfast. 90 capsule 1  . methocarbamol (ROBAXIN) 500 MG tablet Take 1 tablet (500 mg total) by mouth every 6 (six) hours as needed for muscle spasms. 40 tablet 0  . Multiple Vitamin (MULTIVITAMIN) LIQD Place 10 mLs into feeding tube daily. Bariatric Liquid Multivitamin     . Nutritional Supplements (VITAL HP 1.0 CAL) LIQD 65 mL/hr by Feeding Tube route continuous.    . ondansetron (ZOFRAN-ODT) 8 MG disintegrating tablet Take 1 tablet (8 mg total) by mouth every 8 (eight) hours as needed for nausea. 20 tablet 5  . pantoprazole (PROTONIX) 40 MG tablet Take 1 tablet (40 mg total) by mouth 2 (two) times daily before a meal. 180 tablet 1  . polyethylene glycol (MIRALAX / GLYCOLAX) packet Take 17 g by mouth 2 (two) times daily. 14 each 0  . thiamine (VITAMIN B-1) 100 MG tablet Take 100 mg by mouth daily at 12 noon.     . Vitamin D, Ergocalciferol, (DRISDOL) 50000 units CAPS capsule TAKE 1  CAPSULE (50,000 UNITS TOTAL) BY MOUTH EVERY 7 (SEVEN) DAYS. 12 capsule 0   Current Facility-Administered Medications on File Prior to Visit  Medication Dose Route Frequency Provider Last Rate Last Dose  . cyanocobalamin ((VITAMIN B-12)) injection 1,000 mcg  1,000 mcg Intramuscular Q30 days Wardell Honour, MD   1,000 mcg at 04/22/18 1044  . cyanocobalamin ((VITAMIN B-12)) injection 1,000 mcg  1,000 mcg Intramuscular Q30 days Wardell Honour, MD   1,000 mcg at 03/30/18 1125   Social History   Socioeconomic History  . Marital status: Single    Spouse name: Not on file  . Number of children: 3  . Years of education: Some college  . Highest education level: Not on file  Occupational History  . Occupation: Production designer, theatre/television/film    Comment: Currently out on disability d/t surg  Social Needs  . Financial resource strain: Not on file  . Food insecurity:    Worry: Not on file    Inability: Not on file  . Transportation needs:    Medical: Not on file    Non-medical: Not on file  Tobacco Use  . Smoking status: Former Smoker    Packs/day: 0.50    Years: 20.00    Pack years: 10.00    Types: Cigarettes    Last attempt to quit: 03/17/2014    Years since quitting: 4.1  . Smokeless tobacco: Never Used  Substance and Sexual Activity  . Alcohol use: No    Alcohol/week: 0.0 oz  . Drug use: No  . Sexual activity: Not Currently    Birth control/protection: Surgical, Abstinence  Lifestyle  . Physical activity:    Days per week: Not on file    Minutes per session: Not on file  . Stress: Not on file  Relationships  . Social connections:    Talks on phone: Not on file    Gets together: Not on file    Attends religious service: Not on file    Active member of club or organization: Not on file    Attends meetings of clubs or organizations: Not on file    Relationship status: Not on file  . Intimate partner violence:  Fear of current or ex partner: Not on file    Emotionally abused: Not on  file    Physically abused: Not on file    Forced sexual activity: Not on file  Other Topics Concern  . Not on file  Social History Narrative   Marital status:  Single; not dating seriously.      Children:  3 sons (105, 52, 36) whose father is deceased, 1 daughter (deceased age 11); no grandchildren      Lives:  With 2 sons      Employment:  Long term disability in 2018 due to PICC line for iv fluids; Corporate treasurer at [employer]      Tobacco: quit June 2015      Alcohol:  None      Drugs:  None      Exercise:  Leg lifts for knee strengthening; walking   1 caffeine beverage daily      Sexual activity:  Active; total partners = up there.  No STDs in past.        Right-handed   Family History  Problem Relation Age of Onset  . Hypertension Mother   . Diabetes Mother   . Sarcoidosis Mother        lungs and skin  . Asthma Mother   . Hypertension Father   . Diabetes Father   . Asthma Son   . Tics Son   . Asthma Sister   . Cancer Sister        possible pancreatic cancer  . Adrenal disorder Sister        Tumor   . Asthma Brother   . Asthma Daughter        died age 23.5  . Cancer Daughter 4       brain; died age 23.5  . Asthma Son   . Cancer Paternal Aunt        brain, colon, lung and esophagus; unsure of primary        Objective:    BP 116/70   Pulse 87   Temp 98 F (36.7 C) (Oral)   Resp 16   Ht 5' 7.72" (1.72 m)   Wt 185 lb (83.9 kg)   LMP 06/02/2017 Comment: hysterectomy 06/08/2017  SpO2 98%   BMI 28.36 kg/m  Physical Exam  Constitutional: She is oriented to person, place, and time. She appears well-developed and well-nourished. No distress.  HENT:  Head: Normocephalic and atraumatic.  Right Ear: External ear normal.  Left Ear: External ear normal.  Nose: Nose normal.  Mouth/Throat: Oropharynx is clear and moist.  Eyes: Pupils are equal, round, and reactive to light. Conjunctivae and EOM are normal.  Neck: Normal range of motion. Neck supple. Carotid  bruit is not present. No thyromegaly present.  Cardiovascular: Normal rate, regular rhythm, normal heart sounds and intact distal pulses. Exam reveals no gallop and no friction rub.  No murmur heard. Pulmonary/Chest: Effort normal and breath sounds normal. She has no wheezes. She has no rales.  Abdominal: Soft. Bowel sounds are normal. She exhibits no distension and no mass. There is no tenderness. There is no rebound and no guarding.  FEEDING TUBE INSERTION SITE WITHOUT ERYTHEMA; scant feeding residual around tube insertion.  Musculoskeletal:       Left knee: She exhibits decreased range of motion, swelling and effusion.  Lymphadenopathy:    She has no cervical adenopathy.  Neurological: She is alert and oriented to person, place, and time. No cranial nerve deficit.  Skin: Skin is warm and dry. No rash noted. She is not diaphoretic. No erythema. No pallor.  Psychiatric: She has a normal mood and affect. Her behavior is normal.   No results found. Depression screen [facility] 2/9 04/22/2018 02/28/2018 01/26/2018 12/22/2017 12/15/2017  Decreased Interest 0 0 2 1 3   Down, Depressed, Hopeless 0 0 2 1 3   PHQ - 2 Score 0 0 4 2 6   Altered sleeping - - 1 - 3  Tired, decreased energy - - 1 - 3  Change in appetite - - 1 - 3  Feeling bad or failure about yourself  - - 1 - 3  Trouble concentrating - - - - 3  Moving slowly or fidgety/restless - - - - 3  Suicidal thoughts - - - - 0  PHQ-9 Score - - 8 - 24  Difficult doing work/chores - - - - -  Some recent data might be hidden   Fall Risk  04/22/2018 03/30/2018 02/28/2018 01/26/2018 01/01/2018  Falls in the past year? Yes Yes No No Yes  Number falls in past yr: 2 or more 2 or more - - 2 or more  Injury with Fall? - No - - Yes  Comment - - - - -  Follow up - - - - -        Assessment & Plan:   1. B12 deficiency   2. Iron deficiency anemia secondary to inadequate dietary iron intake   3. Malnutrition, calorie ([place])   4. S/P left TKA   5. S/P laparoscopic  sleeve gastrectomy   6. Vitamin D deficiency   7. Leg cramps   8. Reactive depression   9. Intractable cyclical vomiting with nausea     Iron deficiency anemia: Status post blood transfusion with total knee replacement.  Repeat hemoglobin today.  Vitamin D deficiency vitamin B12 deficiency: Stable at this time.  Status post B12 injection in office.  Continue vitamin D supplementation.  Obtain labs for chronic disease management.  Left knee osteoarthritis: Improved.  Status post total knee replacement 1 month ago and doing well.  Pain interfering with sleep currently.  Recommend taking gabapentin 2 at bedtime.  Status post laparoscopic sleeve gastrectomy with malnutrition: Followed by general surgery.  Continue tube feedings per nutrition in general surgery.  Weight is stable and is at goal.  Leg cramps: New at this time.  Likely secondary to recent total knee replacement and change in gait.  Encourage fluid intake and stretching regularly.  Reactive depression: Decline in mood since knee replacement due to insomnia.  Coping well.  Continue with psychotherapy.   Orders Placed This Encounter  Procedures  . CBC with Differential/Platelet  . Comprehensive metabolic panel  . Vitamin B12  . VITAMIN D 25 Hydroxy (Vit-D Deficiency, Fractures)   No orders of the defined types were placed in this encounter.   Return in about 1 month (around 05/20/2018) for follow-up chronic medical conditions [place].   Kristi Elayne Guerin, M.D. Primary Care at [facility] previously [facility] [address] [place], [place]  [address] [phone] phone [phone] fax

## 2018-04-22 NOTE — Patient Instructions (Addendum)
     IF you received an x-ray today, you will receive an invoice from [facility]. Please contact [facility] at [phone] with questions or concerns regarding your invoice.   IF you received labwork today, you will receive an invoice from [facility]. Please contact [facility] at [phone] with questions or concerns regarding your invoice.   Our billing staff will not be able to assist you with questions regarding bills from these companies.  You will be contacted with the lab results as soon as they are available. The fastest way to get your results is to activate your My Chart account. Instructions are located on the last page of this paperwork. If you have not heard from [place] regarding the results in 2 weeks, please contact this office.     Leg Cramps Leg cramps occur when a muscle or muscles tighten and you have no control over this tightening (involuntary muscle contraction). Muscle cramps can develop in any muscle, but the most common place is in the calf muscles of the leg. Those cramps can occur during exercise or when you are at rest. Leg cramps are painful, and they may last for a few seconds to a few minutes. Cramps may return several times before they finally stop. Usually, leg cramps are not caused by a serious medical problem. In many cases, the cause is not known. Some common causes include:  Overexertion.  Overuse from repetitive motions, or doing the same thing over and over.  Remaining in a certain position for a long period of time.  Improper preparation, form, or technique while performing a sport or an activity.  Dehydration.  Injury.  Side effects of some medicines.  Abnormally low levels of the salts and ions in your blood (electrolytes), especially potassium and calcium. These levels could be low if you are taking water pills (diuretics) or if you are pregnant.  Follow these instructions at home: Watch your condition for any changes. Taking  the following actions may help to lessen any discomfort that you are feeling:  Stay well-hydrated. Drink enough fluid to keep your urine clear or pale yellow.  Try massaging, stretching, and relaxing the affected muscle. Do this for several minutes at a time.  For tight or tense muscles, use a warm towel, heating pad, or hot shower water directed to the affected area.  If you are sore or have pain after a cramp, applying ice to the affected area may relieve discomfort. ? Put ice in a plastic bag. ? Place a towel between your skin and the bag. ? Leave the ice on for 20 minutes, 2-3 times per day.  Avoid strenuous exercise for several days if you have been having frequent leg cramps.  Make sure that your diet includes the essential minerals for your muscles to work normally.  Take medicines only as directed by your health care provider.  Contact a health care provider if:  Your leg cramps get more severe or more frequent, or they do not improve over time.  Your foot becomes cold, numb, or blue. This information is not intended to replace advice given to you by your health care provider. Make sure you discuss any questions you have with your health care provider. Document Released: 10/22/2004 Document Revised: 02/20/2016 Document Reviewed: 08/22/2014 Elsevier Interactive Patient Education  [employer].

## 2018-04-23 LAB — CBC WITH DIFFERENTIAL/PLATELET
BASOS: 0 %
Basophils Absolute: 0 10*3/uL (ref 0.0–0.2)
EOS (ABSOLUTE): 0.2 10*3/uL (ref 0.0–0.4)
Eos: 2 %
HEMATOCRIT: 38.7 % (ref 34.0–46.6)
Hemoglobin: 11.7 g/dL (ref 11.1–15.9)
Immature Grans (Abs): 0 10*3/uL (ref 0.0–0.1)
Immature Granulocytes: 0 %
LYMPHS ABS: 3.6 10*3/uL — AB (ref 0.7–3.1)
Lymphs: 43 %
MCH: 25 pg — AB (ref 26.6–33.0)
MCHC: 30.2 g/dL — AB (ref 31.5–35.7)
MCV: 83 fL (ref 79–97)
MONOS ABS: 0.5 10*3/uL (ref 0.1–0.9)
Monocytes: 5 %
NEUTROS ABS: 4.2 10*3/uL (ref 1.4–7.0)
Neutrophils: 50 %
Platelets: 299 10*3/uL (ref 150–450)
RBC: 4.68 x10E6/uL (ref 3.77–5.28)
RDW: 20.1 % — AB (ref 12.3–15.4)
WBC: 8.5 10*3/uL (ref 3.4–10.8)

## 2018-04-23 LAB — VITAMIN B12: Vitamin B-12: 1092 pg/mL (ref 232–1245)

## 2018-04-23 LAB — COMPREHENSIVE METABOLIC PANEL
ALK PHOS: 72 IU/L (ref 39–117)
ALT: 10 IU/L (ref 0–32)
AST: 12 IU/L (ref 0–40)
Albumin/Globulin Ratio: 1.3 (ref 1.2–2.2)
Albumin: 4.4 g/dL (ref 3.5–5.5)
BUN/Creatinine Ratio: 16 (ref 9–23)
BUN: 12 mg/dL (ref 6–24)
Bilirubin Total: 0.4 mg/dL (ref 0.0–1.2)
CO2: 22 mmol/L (ref 20–29)
Calcium: 9.6 mg/dL (ref 8.7–10.2)
Chloride: 103 mmol/L (ref 96–106)
Creatinine, Ser: 0.77 mg/dL (ref 0.57–1.00)
GFR calc Af Amer: 112 mL/min/{1.73_m2} (ref >59)
GFR calc non Af Amer: 97 mL/min/{1.73_m2} (ref >59)
GLOBULIN, TOTAL: 3.3 g/dL (ref 1.5–4.5)
Glucose: 81 mg/dL (ref 65–99)
POTASSIUM: 5.1 mmol/L (ref 3.5–5.2)
SODIUM: 138 mmol/L (ref 134–144)
Total Protein: 7.7 g/dL (ref 6.0–8.5)

## 2018-04-23 LAB — VITAMIN D 25 HYDROXY (VIT D DEFICIENCY, FRACTURES): VIT D 25 HYDROXY: 26.3 ng/mL — AB (ref 30.0–100.0)

## 2018-04-25 DIAGNOSIS — M25562 Pain in left knee: Secondary | ICD-10-CM | POA: Diagnosis not present

## 2018-04-26 DIAGNOSIS — R112 Nausea with vomiting, unspecified: Secondary | ICD-10-CM | POA: Diagnosis not present

## 2018-04-26 DIAGNOSIS — K922 Gastrointestinal hemorrhage, unspecified: Secondary | ICD-10-CM | POA: Diagnosis not present

## 2018-04-26 DIAGNOSIS — K9423 Gastrostomy malfunction: Secondary | ICD-10-CM | POA: Diagnosis not present

## 2018-04-27 DIAGNOSIS — M25562 Pain in left knee: Secondary | ICD-10-CM | POA: Diagnosis not present

## 2018-04-28 ENCOUNTER — Telehealth: Payer: Self-pay | Admitting: *Deleted

## 2018-04-28 ENCOUNTER — Ambulatory Visit (INDEPENDENT_AMBULATORY_CARE_PROVIDER_SITE_OTHER): Payer: 59 | Admitting: Physician Assistant

## 2018-04-28 ENCOUNTER — Encounter: Payer: Self-pay | Admitting: Physician Assistant

## 2018-04-28 ENCOUNTER — Telehealth: Payer: Self-pay | Admitting: Skilled Nursing Facility1

## 2018-04-28 VITALS — BP 104/70 | HR 80 | Ht 66.5 in | Wt 188.0 lb

## 2018-04-28 DIAGNOSIS — K50119 Crohn's disease of large intestine with unspecified complications: Secondary | ICD-10-CM

## 2018-04-28 DIAGNOSIS — Z09 Encounter for follow-up examination after completed treatment for conditions other than malignant neoplasm: Secondary | ICD-10-CM | POA: Diagnosis not present

## 2018-04-28 DIAGNOSIS — R269 Unspecified abnormalities of gait and mobility: Secondary | ICD-10-CM | POA: Diagnosis not present

## 2018-04-28 DIAGNOSIS — K5909 Other constipation: Secondary | ICD-10-CM

## 2018-04-28 DIAGNOSIS — R112 Nausea with vomiting, unspecified: Secondary | ICD-10-CM | POA: Diagnosis not present

## 2018-04-28 DIAGNOSIS — Z903 Acquired absence of stomach [part of]: Secondary | ICD-10-CM

## 2018-04-28 DIAGNOSIS — Z9884 Bariatric surgery status: Secondary | ICD-10-CM | POA: Diagnosis not present

## 2018-04-28 DIAGNOSIS — K922 Gastrointestinal hemorrhage, unspecified: Secondary | ICD-10-CM | POA: Diagnosis not present

## 2018-04-28 NOTE — Patient Instructions (Addendum)
Continue the Protonix 40 mg twice daily.  You have been scheduled for an endoscopy and colonoscopy. Please follow the written instructions given to you at your visit today. Please pick up your prep supplies at the pharmacy within the next 1-3 days. If you use inhalers (even only as needed), please bring them with you on the day of your procedure.  You have been scheduled for a gastric emptying scan at [facility] on [place] 05-11-2018 at 9:30 am. Please arrive at least 15 minutes prior to your appointment for registration. Please make certain not to have anything to eat or drink after 6 hours before your test. Hold all stomach medications (ex: Zofran, phenergan, Reglan) 48 hours prior to your test. If you need to reschedule your appointment, please contact radiology scheduling at [phone].  OK to take Zofran prior to the test. _____________________________________________________________________ A gastric-emptying study measures how long it takes for food to move through your stomach. There are several ways to measure stomach emptying. In the most common test, you eat food that contains a small amount of radioactive material. A scanner that detects the movement of the radioactive material is placed over your abdomen to monitor the rate at which food leaves your stomach. This test normally takes about 4 hours to complete. _____________________________________________________________________

## 2018-04-28 NOTE — Progress Notes (Signed)
Subjective:    Patient ID: [name], female    DOB: 1976/01/31, 41 y.o.   MRN: [id number]  HPI [name] is a pleasant 42 year old African-American female known previously to Dr. Arelia Longest, and last seen here in 2017 She has diagnosis of left-sided colitis made at the time of colonoscopy in 2015 which showed a segmental colitis in the sigmoid from 4 to 45 cm.  She was initially started on Lialda and She is also had hemorrhoids and posterior anal fissure..  Patient did not continue on Lialda and has not been on any medication for colitis over the past couple of years. She comes in today because of ongoing problems with inability to keep down p.o.'s.  She has been managed over the past couple of years by surgery/Dr. Excell Seltzer.  She had initially undergone a gastric sleeve procedure for weight loss however this was complicated by severe reflux and she was converted to a Roux-en-Y gastric bypass in 2018.  Apparently she continues to have persistent nausea inability to eat and developed nonhealing marginal ulcers.  She underwent last EGD in January 2019 per Dr. Lucia Gaskins with finding of a 1 cm ulcer at the anastomosis. She was then admitted and underwent reversal of the Roux-en-Y and partial gastrectomy with placement of feeding gastrostomy in January 2019.  By the op note had resection of her distal gastric pouch losing an approximate 4 cm pouch. Patient says that she has been fed by tube feedings at 65 cc an hour continuously since that time. She is also been continued on twice daily PPI/Protonix, and uses Zofran as needed for nausea.  She has had ongoing problems with constipation and is currently on 290 mcg of Linzess, and MiraLAX twice daily -says she still only has bowel movement every 4 days. She is able to consume very small amounts of liquids, if she drinks too much fluid at one time she will vomit and has been unable to successfully keep down solid food without vomiting.  She mentions that  intermittently she will see some small blood clots come out of the gastrostomy tube. Nutrition is currently good and she is been maintaining her weight, currently at 188 pounds. Pre-bariatric surgery her highest weight was 350 pounds. Patient is looking for answers to her persistent ongoing nausea which is present 24 hours a day and inability to keep down any p.o.'s. She just recently underwent a knee replacement about 6 weeks ago and is recuperating from that.  She is taking some hydrocodone but is weaning off of that. Review of Systems Pertinent positive and negative review of systems were noted in the above HPI section.  All other review of systems was otherwise negative.  Outpatient Encounter Medications as of 04/28/2018  Medication Sig  . albuterol (PROVENTIL HFA;VENTOLIN HFA) 108 (90 Base) MCG/ACT inhaler Inhale 2 puffs into the lungs every 6 (six) hours as needed for wheezing or shortness of breath. For shortness of breath (Patient taking differently: Inhale 2 puffs into the lungs every 6 (six) hours as needed for wheezing or shortness of breath. )  . CALCIUM CITRATE PO 10 mLs by Feeding Tube route every other day. In the afternoon.  . docusate sodium (COLACE) 100 MG capsule Take 1 capsule (100 mg total) by mouth 2 (two) times daily.  . ferrous sulfate (FERROUSUL) 325 (65 FE) MG tablet Take 1 tablet (325 mg total) by mouth 3 (three) times daily with meals.  Marland Kitchen FLUoxetine (PROZAC) 20 MG tablet Take 2 tablets (40 mg total)  by mouth daily.  Marland Kitchen gabapentin (NEURONTIN) 300 MG capsule gabapentin 300 mg capsule  1 po tid  . HYDROcodone-acetaminophen (NORCO) 10-325 MG tablet Take 1-2 tablets by mouth every 4 (four) hours as needed for moderate pain or severe pain (1 pill pain scale 1-5, 2 pill pain scale 6-10).  Marland Kitchen linaclotide (LINZESS) 290 MCG CAPS capsule Take 1 capsule (290 mcg total) by mouth daily before breakfast.  . methocarbamol (ROBAXIN) 500 MG tablet Take 1 tablet (500 mg total) by mouth every 6  (six) hours as needed for muscle spasms.  . Multiple Vitamin (MULTIVITAMIN) LIQD Place 10 mLs into feeding tube daily. Bariatric Liquid Multivitamin   . Nutritional Supplements (VITAL HP 1.0 CAL) LIQD 65 mL/hr by Feeding Tube route continuous.  . ondansetron (ZOFRAN-ODT) 8 MG disintegrating tablet Take 1 tablet (8 mg total) by mouth every 8 (eight) hours as needed for nausea.  . pantoprazole (PROTONIX) 40 MG tablet Take 1 tablet (40 mg total) by mouth 2 (two) times daily before a meal.  . polyethylene glycol (MIRALAX / GLYCOLAX) packet Take 17 g by mouth 2 (two) times daily.  Marland Kitchen thiamine (VITAMIN B-1) 100 MG tablet Take 100 mg by mouth daily at 12 noon.   . Vitamin D, Ergocalciferol, (DRISDOL) 50000 units CAPS capsule TAKE 1 CAPSULE (50,000 UNITS TOTAL) BY MOUTH EVERY 7 (SEVEN) DAYS.   Facility-Administered Encounter Medications as of 04/28/2018  Medication  . cyanocobalamin ((VITAMIN B-12)) injection 1,000 mcg  . cyanocobalamin ((VITAMIN B-12)) injection 1,000 mcg   Allergies  Allergen Reactions  . Coconut Oil Anaphylaxis and Itching  . Morphine And Related Anaphylaxis and Hives    Pt states that she has tolerated Norco and Dilaudid  . Oxycodone Anaphylaxis  . Ciprofloxacin Itching and Rash  . Doxycycline Nausea And Vomiting  . Adhesive [Tape] Rash    And paper tape causes a rash if wearing for a prolong period of time  . Penicillins Rash    Has patient had a PCN reaction causing immediate rash, facial/tongue/throat swelling, SOB or lightheadedness with hypotension: Yes Has patient had a PCN reaction causing severe rash involving mucus membranes or skin necrosis: No Has patient had a PCN reaction that required hospitalization No Has patient had a PCN reaction occurring within the last 10 years: No If all of the above answers are "NO", then may proceed with Cephalosporin use.   Patient Active Problem List   Diagnosis Date Noted  . Postoperative anemia due to acute blood loss 03/11/2018    . 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 B12 deficiency 12/01/2017  . Precordial chest pain 11/04/2017  . Rapid or irregular heartbeat 11/04/2017  . Chest pain at rest 11/04/2017  . Right shoulder pain 11/04/2017  . Marginal ulcer 10/25/2017  . Dehydration 04/02/2017  . Malnutrition, calorie ([place]) 03/27/2017  . Psychophysiological insomnia 03/18/2017  . Nausea and vomiting 12/14/2016  . Paresthesia 07/22/2016  . Chronic headache disorder 07/22/2016  . Memory difficulty 07/22/2016  . Hematochezia 04/04/2016  . 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 Anemia, iron deficiency 10/01/2015  . Anal fissure - posterior 10/16/2014  . Left sided chronic colitis - segmental 06/11/2014  . Internal and external bleeding hemorrhoids 06/11/2014  . Asthma 04/26/2012  . 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 Marital status: Single    Spouse name: Not on file  . Number of children: 3  . Years of education: Some college  . Highest education level: Not on file  Occupational History  . Occupation: Production designer, theatre/television/film    Comment: Currently out on disability d/t surg  Social Needs  . Financial resource strain: Not on file  . Food insecurity:    Worry: Not on file    Inability: Not on file  . Transportation needs:    Medical: Not on file    Non-medical: Not on file  Tobacco Use  . Smoking status: Former Smoker    Packs/day: 0.50    Years: 20.00    Pack years: 10.00    Types: Cigarettes    Last attempt to quit: 03/17/2014    Years since quitting: 4.1  . Smokeless tobacco: Never Used  Substance and Sexual Activity  . Alcohol use: No    Alcohol/week: 0.0 oz  . Drug use: No  . Sexual activity: Not Currently    Birth control/protection: Surgical, Abstinence  Lifestyle  . Physical activity:    Days per week: Not on file    Minutes per session: Not on file  . Stress: Not on file  Relationships  . Social connections:    Talks on phone: Not on file     Gets together: Not on file    Attends religious service: Not on file    Active member of club or organization: Not on file    Attends meetings of clubs or organizations: Not on file    Relationship status: Not on file  . Intimate partner violence:    Fear of current or ex partner: Not on file    Emotionally abused: Not on file    Physically abused: Not on file    Forced sexual activity: Not on file  Other Topics Concern  . Not on file  Social History Narrative   Marital status:  Single; not dating seriously.      Children:  3 sons (95, 61, 39) whose father is deceased, 1 daughter (deceased age 59); no grandchildren      Lives:  With 2 sons      Employment:  Long term disability in 2018 due to PICC line for iv fluids; Corporate treasurer at [employer]      Tobacco: quit June 2015      Alcohol:  None      Drugs:  None      Exercise:  Leg lifts for knee strengthening; walking   1 caffeine beverage daily      Sexual activity:  Active; total partners = up there.  No STDs in past.        Right-handed    Ms. Van's family history includes Adrenal disorder in her sister; Asthma in her brother, daughter, mother, sister, son, and son; Cancer in her paternal aunt and sister; Cancer (age of onset: 32) in her daughter; Diabetes in her father and mother; Hypertension in her father and mother; Sarcoidosis in her mother; Tics in her son.      Objective:    Vitals:   04/28/18 0821  BP: 104/70  Pulse: 80    Physical Exam; well-developed African-American female, no acute distress, pleasant blood pressure 104/70 pulse 80, height 5 foot 6, weight 188, BMI 29.8.  HEENT; nontraumatic normocephalic EOMI PERRLA sclera anicteric buccal mucosa moist, neck supple, Cardiovascular; regular rate and rhythm with S1-S2 no murmur rub or gallop, Pulmonary ;clear bilaterally, Abdomen; soft, nontender, nondistended bowel sounds are present, gastrostomy tube present with tube feedings infusing, midline incisional  scars palpable mass or hepatosplenomegaly, Rectal ;exam not done, Extremities ;no clubbing cyanosis  or edema skin warm and dry, Neuro psych; alert and oriented, grossly nonfocal mood and affect appropriate       Assessment & Plan:   #39 42 year old African-American female with constant nausea over the past 2 years, and inability to keep down any significant calories orally either by liquid or solid form. She is status post gastric sleeve for weight loss complicated by severe GERD.  Procedure was converted to Roux-en-Y gastric bypass in 2018.  She developed persistent nonhealing marginal ulcerations, persistent nausea and eventually underwent versatile of the Roux-en-Y and partial gastrectomy in January 2019. Patient has been on continuous tube feedings at 65 cc/h over the past 6 months since surgery. Etiology of constant nausea intermittent vomiting and inability to keep down p.o.'s is not clear. Question severe gastroparesis, question symptoms secondary to tiny pouch with free reflux.  #2 chronic constipation-fair control with high-dose Linzess and MiraLAX twice daily 3 previous diagnosis of segmental colitis last colonoscopy 2015-on no treatment #4 status post recent knee replacement  Plan; continue Zofran every 6 hours as needed for nausea We will schedule for gastric emptying scan.  Patient thinks she can keep down and egg if she takes Zofran prior to procedure.  We did discuss liquid substrate with nuc med, they do these very infrequently and are felt to be significantly less accurate.  We will also schedule for EGD with Dr. Carlean Purl, and patient needs follow-up colonoscopy regarding colitis before we will schedule both procedures for the same day.  Procedures were discussed in detail with patient including indications risks and benefits and she is agreeable to proceed. Continue Protonix 40 mg p.o. twice daily for now though not certain she is making much acid status post partial gastrectomy  with very small residual pouch  Emya Picado S Gracen Ringwald PA-C 04/28/2018   Cc: Wardell Honour, MD

## 2018-04-28 NOTE — Telephone Encounter (Signed)
Called the patient to inform her of the Gastric Emptying scan we scheduled. It is at [facility] on 05-11-2018 at 9:30 am. She is to be there at 9:15 am and have nothting by mouth for 6 hours prior to the test.  She is to take 1 zofran tablet 30 min with a sip of water to get it down.  Patient verbally understood instructions.

## 2018-04-29 DIAGNOSIS — M25562 Pain in left knee: Secondary | ICD-10-CM | POA: Diagnosis not present

## 2018-05-02 ENCOUNTER — Telehealth: Payer: Self-pay | Admitting: *Deleted

## 2018-05-02 NOTE — Telephone Encounter (Signed)
Faxed signed release of information on 04-28-2018. Signed by the patient to [facility], Dr. Lonzo Candy.  Received recrords we requested today 05-02-2018.  Records

## 2018-05-03 DIAGNOSIS — M25562 Pain in left knee: Secondary | ICD-10-CM | POA: Diagnosis not present

## 2018-05-05 DIAGNOSIS — M25562 Pain in left knee: Secondary | ICD-10-CM | POA: Diagnosis not present

## 2018-05-10 DIAGNOSIS — M25562 Pain in left knee: Secondary | ICD-10-CM | POA: Diagnosis not present

## 2018-05-11 ENCOUNTER — Encounter (HOSPITAL_COMMUNITY)
Admission: RE | Admit: 2018-05-11 | Discharge: 2018-05-11 | Disposition: A | Discharge status: Home / Self Care | Payer: 59 | Source: Ambulatory Visit | Attending: Physician Assistant | Admitting: Physician Assistant

## 2018-05-11 DIAGNOSIS — Z903 Acquired absence of stomach [part of]: Secondary | ICD-10-CM | POA: Diagnosis present

## 2018-05-11 DIAGNOSIS — R112 Nausea with vomiting, unspecified: Secondary | ICD-10-CM

## 2018-05-11 DIAGNOSIS — Z09 Encounter for follow-up examination after completed treatment for conditions other than malignant neoplasm: Secondary | ICD-10-CM | POA: Diagnosis present

## 2018-05-11 MED ORDER — TECHNETIUM TC 99M SULFUR COLLOID
2.2000 | Freq: Once | INTRAVENOUS | Status: AC | PRN
  Administered 2018-05-11: 2.2 via INTRAVENOUS

## 2018-05-12 DIAGNOSIS — M25562 Pain in left knee: Secondary | ICD-10-CM | POA: Diagnosis not present

## 2018-05-16 DIAGNOSIS — M25562 Pain in left knee: Secondary | ICD-10-CM | POA: Diagnosis not present

## 2018-05-18 DIAGNOSIS — M25562 Pain in left knee: Secondary | ICD-10-CM | POA: Diagnosis not present

## 2018-05-24 ENCOUNTER — Ambulatory Visit (INDEPENDENT_AMBULATORY_CARE_PROVIDER_SITE_OTHER): Payer: 59 | Admitting: Family Medicine

## 2018-05-24 ENCOUNTER — Encounter: Payer: Self-pay | Admitting: Family Medicine

## 2018-05-24 ENCOUNTER — Other Ambulatory Visit: Payer: Self-pay

## 2018-05-24 VITALS — BP 128/80 | HR 85 | Temp 98.4°F | Ht 67.0 in | Wt 192.8 lb

## 2018-05-24 DIAGNOSIS — F329 Major depressive disorder, single episode, unspecified: Secondary | ICD-10-CM

## 2018-05-24 DIAGNOSIS — D508 Other iron deficiency anemias: Secondary | ICD-10-CM

## 2018-05-24 DIAGNOSIS — G894 Chronic pain syndrome: Secondary | ICD-10-CM | POA: Diagnosis not present

## 2018-05-24 DIAGNOSIS — Z23 Encounter for immunization: Secondary | ICD-10-CM | POA: Diagnosis not present

## 2018-05-24 DIAGNOSIS — M2550 Pain in unspecified joint: Secondary | ICD-10-CM

## 2018-05-24 DIAGNOSIS — M25562 Pain in left knee: Secondary | ICD-10-CM | POA: Diagnosis not present

## 2018-05-24 MED ORDER — GABAPENTIN 300 MG PO CAPS: ORAL_CAPSULE | ORAL | 3 refills | Status: DC

## 2018-05-24 MED ORDER — FLUOXETINE HCL 20 MG/5ML PO SOLN: 40.0000 mg | Freq: Every day | ORAL | 3 refills | Status: DC

## 2018-05-24 MED ORDER — FERROUS SULFATE 325 (65 FE) MG PO TABS: 325.0000 mg | ORAL_TABLET | Freq: Every day | ORAL | 3 refills | Status: DC

## 2018-05-24 MED ORDER — HYDROCODONE-ACETAMINOPHEN 10-325 MG PO TABS: 1.0000 | ORAL_TABLET | Freq: Four times a day (QID) | ORAL | 0 refills | Status: DC | PRN

## 2018-05-24 NOTE — Patient Instructions (Signed)
° ° ° °  If you have lab work done today you will be contacted with your lab results within the next 2 weeks.  If you have not heard from us then please contact us. The fastest way to get your results is to register for My Chart. ° ° °IF you received an x-ray today, you will receive an invoice from [facility]. Please contact [facility] at [phone] with questions or concerns regarding your invoice.  ° °IF you received labwork today, you will receive an invoice from [facility]. Please contact [facility] at [phone] with questions or concerns regarding your invoice.  ° °Our billing staff will not be able to assist you with questions regarding bills from these companies. ° °You will be contacted with the lab results as soon as they are available. The fastest way to get your results is to activate your My Chart account. Instructions are located on the last page of this paperwork. If you have not heard from us regarding the results in 2 weeks, please contact this office. °  ° ° ° °

## 2018-05-24 NOTE — Progress Notes (Signed)
8/27/20198:52 AM  [name] [birth year], 42 y.o. female [id number]  Chief Complaint  Patient presents with  . Follow-up    vitamin D, deficiency, energy is very low takiing vit sup. Needs refill on Drisdol. deals with depression, however will not take meds nor discuss  . Pain    wants to discuss increase in pain meds. Taking 3 Norcos to deal with the pain. Having leg, neck and back pain    HPI:   Patient is a 42 y.o. 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, nutriotional deficiencies, on TF, depression who presents today to establish care  Previous PCP Dr Tamala Julian, last appt in July Vitamin d in July 2019 - 26, improved, she is maintained on high dose vitamin D given h/ o bariatric surgery On TF, sees Dr Rosamaria Lints, bariatric surgeons, sees about every 1-2 months Also sees Dr Carlean Purl Talk Ubaldo Glassing - nutritionist over the phone monthly osmolyte 1.2 does 6.5 cans a day 60 cc of water 4 - 5 times a day Able to drink by mouth but does not really eat She does her pills by mouth Takes miralax and colace for constipation Has issues with depression, takes prozac 58m, denies SI Tried cymbalta in the past but made her more derpressed Uses a cane Everything hurts, and hurts all the time Goes from bad to worse, but never from bad to better Had injections in her neck and helped for a few weeks Both joints and muscles, joints get warm and swollen Knees sometimes fell feverish Gets spasms in her legs, specially at night, pins and needles, feels that she needs to get up and walk, makes it better Takes vicodin, gabapentin, methocarbamol  Fall Risk  05/24/2018 04/22/2018 03/30/2018 02/28/2018 01/26/2018  Falls in the past year? No Yes Yes No No  Number falls in past yr: - 2 or more 2 or more - -  Injury with Fall? - - No - -  Comment - - - - -  Follow up - - - - -     [facility] 04/22/2018 02/28/2018 01/26/2018    Decreased Interest 0 0 2  Down, Depressed, Hopeless 0 0 2  PHQ - 2 Score 0 0 4  Altered sleeping - - 1  Tired, decreased energy - - 1  Change in appetite - - 1  Feeling bad or failure about yourself  - - 1  Trouble concentrating - - -  Moving slowly or fidgety/restless - - -  Suicidal thoughts - - -  PHQ-9 Score - - 8  Difficult doing work/chores - - -  Some recent data might be hidden    Allergies  Allergen Reactions  . Coconut Oil Anaphylaxis and Itching  . Morphine And Related Anaphylaxis and Hives    Pt states that she has tolerated Norco and Dilaudid  . Oxycodone Anaphylaxis  . Ciprofloxacin Itching and Rash  . Doxycycline Nausea And Vomiting  . Adhesive [Tape] Rash    And paper tape causes a rash if wearing for a prolong period of time  . Penicillins Rash    Has patient had a PCN reaction causing immediate rash, facial/tongue/throat swelling, SOB or lightheadedness with hypotension: Yes Has patient had a PCN reaction causing severe rash involving mucus membranes or skin necrosis: No Has patient had a PCN reaction that required hospitalization No Has patient had a PCN reaction occurring within the last 10 years: No If all of the  above answers are "NO", then may proceed with Cephalosporin use.    Prior to Admission medications   Medication Sig Start Date End Date Taking? Authorizing Provider  albuterol (PROVENTIL HFA;VENTOLIN HFA) 108 (90 Base) MCG/ACT inhaler Inhale 2 puffs into the lungs every 6 (six) hours as needed for wheezing or shortness of breath. For shortness of breath Patient taking differently: Inhale 2 puffs into the lungs every 6 (six) hours as needed for wheezing or shortness of breath.  09/22/16   Wardell Honour, MD  CALCIUM CITRATE PO 10 mLs by Feeding Tube route every other day. In the afternoon.    [provider]  docusate sodium (COLACE) 100 MG capsule Take 1 capsule (100 mg total) by mouth 2 (two) times daily. 03/11/18   Danae Orleans, PA-C   ferrous sulfate (FERROUSUL) 325 (65 FE) MG tablet Take 1 tablet (325 mg total) by mouth 3 (three) times daily with meals. 03/11/18   Danae Orleans, PA-C  FLUoxetine (PROZAC) 20 MG tablet Take 2 tablets (40 mg total) by mouth daily. 03/30/18   Wardell Honour, MD  gabapentin (NEURONTIN) 300 MG capsule gabapentin 300 mg capsule  1 po tid    [provider]  HYDROcodone-acetaminophen (NORCO) 10-325 MG tablet Take 1-2 tablets by mouth every 4 (four) hours as needed for moderate pain or severe pain (1 pill pain scale 1-5, 2 pill pain scale 6-10). 03/15/18   Danae Orleans, PA-C  linaclotide (LINZESS) 290 MCG CAPS capsule Take 1 capsule (290 mcg total) by mouth daily before breakfast. 02/28/18   Wardell Honour, MD  methocarbamol (ROBAXIN) 500 MG tablet Take 1 tablet (500 mg total) by mouth every 6 (six) hours as needed for muscle spasms. 03/15/18   Danae Orleans, PA-C  Multiple Vitamin (MULTIVITAMIN) LIQD Place 10 mLs into feeding tube daily. Bariatric Liquid Multivitamin     [provider]  Nutritional Supplements (VITAL HP 1.0 CAL) LIQD 65 mL/hr by Feeding Tube route continuous.    [provider]  ondansetron (ZOFRAN-ODT) 8 MG disintegrating tablet Take 1 tablet (8 mg total) by mouth every 8 (eight) hours as needed for nausea. 12/15/17   Wardell Honour, MD  pantoprazole (PROTONIX) 40 MG tablet Take 1 tablet (40 mg total) by mouth 2 (two) times daily before a meal. 12/15/17   Wardell Honour, MD  polyethylene glycol (MIRALAX / Floria Raveling) packet Take 17 g by mouth 2 (two) times daily. 03/11/18   Danae Orleans, PA-C  thiamine (VITAMIN B-1) 100 MG tablet Take 100 mg by mouth daily at 12 noon.     [provider]  Vitamin D, Ergocalciferol, (DRISDOL) 50000 units CAPS capsule TAKE 1 CAPSULE (50,000 UNITS TOTAL) BY MOUTH EVERY 7 (SEVEN) DAYS. 03/15/18   Wardell Honour, MD    Past Medical History:  Diagnosis Date  . Anal fissure - posterior 10/16/2014    OCC  ISSUES  .  Anxiety    doesn't take anything  . Asthma    has Albuterol inhaler as needed  . Boil    on pubic area started septra 03-01-17 draining blood and pus  . Bronchitis   . Chronic headache disorder 07/22/2016  . Clostridium difficile infection 04/20/2012  . Colitis   . Dehydration   . Depression   . Family history of adverse reaction to anesthesia    pt mom gets sick  . GERD (gastroesophageal reflux disease)    takes Pantoprazole daily  . Headache   . Hiatal hernia  neuropathy - mild in arms and legs  . History of blood transfusion    last transfusion was 04/04/2016=Benadryl was given d/t itching. States she always itches with transfusion.   Marland Kitchen History of bronchitis    > 2 yrs ago  . History of colon polyps    benign  . History of migraine    last one 05/01/16  . History of stomach ulcers   . History of urinary tract infection LAST 2 WEEKS AGO  . IDA (iron deficiency anemia)   . Internal and external bleeding hemorrhoids 06/11/2014  . Joint pain   . Joint swelling   . Left sided chronic colitis - segmental 06/11/2014  . Migraine   . Motion sickness   . Nausea    takes Zofran as needed  . Nausea and vomiting    for 1 year  . Obesity   . Oligouria   . Osteoarthritis    lower back, knees, wrists - no meds  . Pneumonia 1997  . Postoperative nausea and vomiting 01/21/2016   wants scopolamine patch  . SVD (spontaneous vaginal delivery)    x 4  . Tingling    BOTH LOWER EXTREMETIES ALL THE TIME DUE TO RAPID WEIGHT LOSS  . TOBACCO USER 10/02/2009   Qualifier: Diagnosis of  By: Dimas Millin MD, Ellard Artis    . Transfusion history    last transfusion 6'16   . UC (ulcerative colitis) ([place])    supposed to be taking Lialda and Bentyl but has been off since gastric sleeve  . Ulcerative colitis ([place])   . Vertigo    doesn't take any meds    Past Surgical History:  Procedure Laterality Date  . ABDOMINAL HYSTERECTOMY     PARTIAL  . COLONOSCOPY  2007   for rectal bleeding; [facility]  .  COLONOSCOPY N/A 06/11/2014   Procedure: COLONOSCOPY;  Surgeon: Gatha Mayer, MD;  Location: [facility];  Service: Endoscopy;  Laterality: N/A;  . COLONOSCOPY WITH PROPOFOL N/A 03/02/2017   Procedure: COLONOSCOPY WITH PROPOFOL;  Surgeon: Alphonsa Overall, MD;  Location: [facility];  Service: General;  Laterality: N/A;  . DILATION AND CURETTAGE OF UTERUS    . ESOPHAGOGASTRODUODENOSCOPY (EGD) WITH PROPOFOL N/A 04/06/2016   Procedure: ESOPHAGOGASTRODUODENOSCOPY (EGD) WITH PROPOFOL;  Surgeon: Alphonsa Overall, MD;  Location: [facility];  Service: General;  Laterality: N/A;  . ESOPHAGOGASTRODUODENOSCOPY (EGD) WITH PROPOFOL N/A 03/02/2017   Procedure: ESOPHAGOGASTRODUODENOSCOPY (EGD) WITH PROPOFOL;  Surgeon: Alphonsa Overall, MD;  Location: [facility];  Service: General;  Laterality: N/A;  . ESOPHAGOGASTRODUODENOSCOPY (EGD) WITH PROPOFOL N/A 05/20/2017   Procedure: ESOPHAGOGASTRODUODENOSCOPY (EGD) WITH PROPOFOL ERAS PATHWAY;  Surgeon: Alphonsa Overall, MD;  Location: [facility];  Service: General;  Laterality: N/A;  . ESOPHAGOGASTRODUODENOSCOPY (EGD) WITH PROPOFOL N/A 10/07/2017   Procedure: ESOPHAGOGASTRODUODENOSCOPY (EGD) WITH PROPOFOL;  Surgeon: Alphonsa Overall, MD;  Location: [facility];  Service: General;  Laterality: N/A;  . EXCISION OF SKIN TAG  06/08/2017   Procedure: EXCISION OF VULVAR SKIN TAGS X2;  Surgeon: [name], MD;  Location: [facility];  Service: Gynecology;;  . FOOT SURGERY Bilateral    x 2  . GASTRIC ROUX-EN-Y N/A 12/14/2016   Procedure: LAPAROSCOPIC REVISION SLEEVE GASTRECTOMY TO  ROUX-Y-GASTRIC BY-PASS, UPPER ENDO;  Surgeon: Excell Seltzer, MD;  Location: [facility];  Service: General;  Laterality: N/A;  . GASTROJEJUNOSTOMY N/A 05/11/2016   Procedure: LAPAROSCOPIC PLACEMENT  OF FEEDING  JEJUNOSTOMY TUBE;  Surgeon: Excell Seltzer, MD;  Location: [facility];  Service: General;  Laterality: N/A;  .  HEMORRHOIDECTOMY WITH HEMORRHOID BANDING    . IR FLUORO GUIDE CV LINE RIGHT  04/06/2017  . IR  GENERIC HISTORICAL  05/19/2016   IR CM INJ ANY COLONIC TUBE W/FLUORO 05/19/2016 Aletta Edouard, MD [facility]  . IR PATIENT EVAL TECH 0-60 MINS  11/30/2017  . IR REPLC DUODEN/JEJUNO TUBE PERCUT W/FLUORO  03/14/2018  . IR REPLC DUODEN/JEJUNO TUBE PERCUT W/FLUORO  03/15/2018  . IR US GUIDE VASC ACCESS RIGHT  04/06/2017  . j tube removed march 2018    . KNEE ARTHROSCOPY Left 09/06/2014  . LAPAROSCOPIC GASTRIC SLEEVE RESECTION N/A 01/14/2016   Procedure: LAPAROSCOPIC GASTRIC SLEEVE RESECTION;  Surgeon: Excell Seltzer, MD;  Location: [facility];  Service: General;  Laterality: N/A;  . LAPAROSCOPIC REVISION OF GASTROJEJUNOSTOMY N/A 10/25/2017   Procedure: LAPAROSCOPIC REVISION OF GASTROJEJUNOSTOMY AND PARTIAL GASTRECTOMY, WITH PLACEMENT OF FEEDING GASTROSTOMY TUBE;  Surgeon: Excell Seltzer, MD;  Location: [facility];  Service: General;  Laterality: N/A;  . LAPAROSCOPIC TUBAL LIGATION  10/16/2011   Procedure: LAPAROSCOPIC TUBAL LIGATION;  Surgeon: Alwyn Pea, MD;  Location: [facility];  Service: Gynecology;  Laterality: Bilateral;  . NOVASURE ABLATION  09/28/2010   mild persistent vaginal bleeding  . right knee arthroscopy     05/07/2016  . TOTAL KNEE ARTHROPLASTY Left 03/10/2018   Procedure: LEFT TOTAL KNEE ARTHROPLASTY;  Surgeon: Paralee Cancel, MD;  Location: [facility];  Service: Orthopedics;  Laterality: Left;  70 mins  . TUBAL LIGATION    . VAGINAL HYSTERECTOMY Bilateral 06/08/2017   Procedure: HYSTERECTOMY VAGINAL W/ BILATERAL SALPINGECTOMY;  Surgeon: [name], MD;  Location: [facility];  Service: Gynecology;  Laterality: Bilateral;  . wisdom teeth extracted      Social History   Tobacco Use  . Smoking status: Former Smoker    Packs/day: 0.50    Years: 20.00    Pack years: 10.00    Types: Cigarettes    Last attempt to quit: 03/17/2014    Years since quitting: 4.1  . Smokeless tobacco: Never Used  Substance Use Topics  . Alcohol use: No    Alcohol/week: 0.0 standard drinks    Family History    Problem Relation Age of Onset  . Hypertension Mother   . Diabetes Mother   . Sarcoidosis Mother        lungs and skin  . Asthma Mother   . Hypertension Father   . Diabetes Father   . Asthma Son   . Tics Son   . Asthma Sister   . Cancer Sister        possible pancreatic cancer  . Adrenal disorder Sister        Tumor   . Asthma Brother   . Asthma Daughter        died age 39.5  . Cancer Daughter 4       brain; died age 39.5  . Asthma Son   . Cancer Paternal Aunt        brain, colon, lung and esophagus; unsure of primary     Review of Systems  Constitutional: Negative for chills and fever.  Respiratory: Negative for cough and shortness of breath.   Cardiovascular: Negative for chest pain, palpitations and leg swelling.  Gastrointestinal: Positive for abdominal pain, constipation and nausea. Negative for diarrhea and vomiting.  Musculoskeletal: Positive for back pain, joint pain, myalgias and neck pain.  Neurological: Positive for tingling and sensory change.  Psychiatric/Behavioral: Positive for depression. Negative for suicidal ideas. The patient has insomnia.  OBJECTIVE:  Blood pressure 128/80, pulse 85, temperature 98.4 F (36.9 C), temperature source Oral, height 5' 7"  (1.702 m), weight 192 lb 12.8 oz (87.5 kg), last menstrual period 06/02/2017, SpO2 100 %. Body mass index is 30.2 kg/m.   Wt Readings from Last 3 Encounters:  05/24/18 192 lb 12.8 oz (87.5 kg)  04/28/18 188 lb (85.3 kg)  04/22/18 185 lb (83.9 kg)    Physical Exam  Constitutional: She is oriented to person, place, and time. She appears well-developed and well-nourished.  HENT:  Head: Normocephalic and atraumatic.  Mouth/Throat: Oropharynx is clear and moist. No oropharyngeal exudate.  Eyes: Pupils are equal, round, and reactive to light. Conjunctivae and EOM are normal. No scleral icterus.  Neck: Neck supple.  Cardiovascular: Normal rate, regular rhythm and normal heart sounds. Exam reveals no  gallop and no friction rub.  No murmur heard. Pulmonary/Chest: Effort normal and breath sounds normal. She has no wheezes. She has no rales.  Musculoskeletal: She exhibits no edema.       Right wrist: She exhibits swelling.  Neurological: She is alert and oriented to person, place, and time. Gait abnormal.  Skin: Skin is warm and dry.  Psychiatric: She has a normal mood and affect.  Nursing note and vitals reviewed.   ASSESSMENT and PLAN  1. Iron deficiency anemia secondary to inadequate dietary iron intake S/p recent blood transfusion. Last ferritin 4. Constipation well managed on miralax and colace. Trial of feso4 once a day. - Ferritin  2. Reactive depression Not well controlled, changing to liquid prozac and see if better absorbed. If not titrate to effect.   3. Arthralgia, unspecified joint - ANA w/Reflex if Positive - Rheumatoid factor - Sedimentation Rate - C-reactive protein  4. Chronic pain syndrome vicodin refilled today Increasing bedtime gabapentin, as there seems to be a component of RLS and fibromyalgia  5. Need for vaccination - Flu Vaccine QUAD 36+ mos IM  Other orders - FLUoxetine (PROZAC) 20 MG/5ML solution; Place 10 mLs (40 mg total) into feeding tube daily. - ferrous sulfate (FERROUSUL) 325 (65 FE) MG tablet; Take 1 tablet (325 mg total) by mouth daily with breakfast. - gabapentin (NEURONTIN) 300 MG capsule; Take 1 capsule (300 mg total) by mouth 2 (two) times daily AND 2 capsules (600 mg total) at bedtime. - HYDROcodone-acetaminophen (NORCO) 10-325 MG tablet; Take 1 tablet by mouth every 6 (six) hours as needed for severe pain.  Return in about 4 weeks (around 06/21/2018).    Rutherford Guys, MD [facility] [place], [place] [address] Ph.  [phone] Fax [phone]

## 2018-05-25 LAB — ANA W/REFLEX IF POSITIVE: Anti Nuclear Antibody(ANA): NEGATIVE

## 2018-05-25 LAB — RHEUMATOID FACTOR: Rhuematoid fact SerPl-aCnc: <10 IU/mL (ref 0.0–13.9)

## 2018-05-25 LAB — FERRITIN: Ferritin: 11 ng/mL — ABNORMAL LOW (ref 15–150)

## 2018-05-25 LAB — C-REACTIVE PROTEIN: CRP: <1 mg/L (ref 0–10)

## 2018-05-25 LAB — SEDIMENTATION RATE: Sed Rate: 9 mm/hr (ref 0–32)

## 2018-05-27 DIAGNOSIS — Z9884 Bariatric surgery status: Secondary | ICD-10-CM | POA: Diagnosis not present

## 2018-05-27 DIAGNOSIS — K922 Gastrointestinal hemorrhage, unspecified: Secondary | ICD-10-CM | POA: Diagnosis not present

## 2018-05-27 DIAGNOSIS — K9423 Gastrostomy malfunction: Secondary | ICD-10-CM | POA: Diagnosis not present

## 2018-05-27 DIAGNOSIS — R269 Unspecified abnormalities of gait and mobility: Secondary | ICD-10-CM | POA: Diagnosis not present

## 2018-05-27 DIAGNOSIS — R112 Nausea with vomiting, unspecified: Secondary | ICD-10-CM | POA: Diagnosis not present

## 2018-05-27 DIAGNOSIS — M25562 Pain in left knee: Secondary | ICD-10-CM | POA: Diagnosis not present

## 2018-05-31 DIAGNOSIS — M25562 Pain in left knee: Secondary | ICD-10-CM | POA: Diagnosis not present

## 2018-06-02 DIAGNOSIS — M25562 Pain in left knee: Secondary | ICD-10-CM | POA: Diagnosis not present

## 2018-06-06 DIAGNOSIS — Z79899 Other long term (current) drug therapy: Secondary | ICD-10-CM | POA: Diagnosis not present

## 2018-06-06 DIAGNOSIS — M5030 Other cervical disc degeneration, unspecified cervical region: Secondary | ICD-10-CM | POA: Diagnosis not present

## 2018-06-06 DIAGNOSIS — G894 Chronic pain syndrome: Secondary | ICD-10-CM | POA: Diagnosis not present

## 2018-06-07 DIAGNOSIS — M25562 Pain in left knee: Secondary | ICD-10-CM | POA: Diagnosis not present

## 2018-06-09 DIAGNOSIS — M25562 Pain in left knee: Secondary | ICD-10-CM | POA: Diagnosis not present

## 2018-06-15 ENCOUNTER — Encounter: Payer: Self-pay | Admitting: Internal Medicine

## 2018-06-20 DIAGNOSIS — M25562 Pain in left knee: Secondary | ICD-10-CM | POA: Diagnosis not present

## 2018-06-21 DIAGNOSIS — M5136 Other intervertebral disc degeneration, lumbar region: Secondary | ICD-10-CM | POA: Diagnosis not present

## 2018-06-21 DIAGNOSIS — M5030 Other cervical disc degeneration, unspecified cervical region: Secondary | ICD-10-CM | POA: Diagnosis not present

## 2018-06-23 DIAGNOSIS — M1712 Unilateral primary osteoarthritis, left knee: Secondary | ICD-10-CM | POA: Diagnosis not present

## 2018-06-25 ENCOUNTER — Ambulatory Visit: Payer: 59 | Admitting: Family Medicine

## 2018-06-27 DIAGNOSIS — K922 Gastrointestinal hemorrhage, unspecified: Secondary | ICD-10-CM | POA: Diagnosis not present

## 2018-06-27 DIAGNOSIS — K9423 Gastrostomy malfunction: Secondary | ICD-10-CM | POA: Diagnosis not present

## 2018-06-27 DIAGNOSIS — R112 Nausea with vomiting, unspecified: Secondary | ICD-10-CM | POA: Diagnosis not present

## 2018-06-28 ENCOUNTER — Encounter: Payer: Self-pay | Admitting: Internal Medicine

## 2018-06-28 ENCOUNTER — Ambulatory Visit (AMBULATORY_SURGERY_CENTER): Payer: 59 | Admitting: Internal Medicine

## 2018-06-28 VITALS — BP 99/62 | HR 57 | Temp 97.1°F | Resp 11 | Ht 66.5 in | Wt 188.0 lb

## 2018-06-28 DIAGNOSIS — K5010 Crohn's disease of large intestine without complications: Secondary | ICD-10-CM

## 2018-06-28 DIAGNOSIS — R112 Nausea with vomiting, unspecified: Secondary | ICD-10-CM | POA: Diagnosis not present

## 2018-06-28 DIAGNOSIS — K5900 Constipation, unspecified: Secondary | ICD-10-CM | POA: Diagnosis not present

## 2018-06-28 DIAGNOSIS — K529 Noninfective gastroenteritis and colitis, unspecified: Secondary | ICD-10-CM | POA: Diagnosis not present

## 2018-06-28 MED ORDER — SODIUM CHLORIDE 0.9 % IV SOLN: 500.0000 mL | Freq: Once | INTRAVENOUS | Status: DC

## 2018-06-28 NOTE — Progress Notes (Signed)
Spontaneous respirations throughout. VSS. Resting comfortably. To PACU on room air. Report to  RN. 

## 2018-06-28 NOTE — Op Note (Signed)
[facility] Patient Name: [name] Procedure Date: 06/28/2018 10:57 AM MRN: [id number] Endoscopist: Gatha Mayer , MD Age: 42 Referring MD:  Date of Birth: 05/14/1976 Gender: Female Account #: [id number] Procedure:                Upper GI endoscopy Indications:              Nausea, Nausea with vomiting, Persistent vomiting Medicines:                Propofol per Anesthesia, Monitored Anesthesia Care Procedure:                Pre-Anesthesia Assessment:                           - Prior to the procedure, a History and Physical                            was performed, and patient medications and                            allergies were reviewed. The patient's tolerance of                            previous anesthesia was also reviewed. The risks                            and benefits of the procedure and the sedation                            options and risks were discussed with the patient.                            All questions were answered, and informed consent                            was obtained. Prior Anticoagulants: The patient has                            taken no previous anticoagulant or antiplatelet                            agents. ASA Grade Assessment: II - A patient with                            mild systemic disease. After reviewing the risks                            and benefits, the patient was deemed in                            satisfactory condition to undergo the procedure.                           After obtaining informed consent, the endoscope was  passed under direct vision. Throughout the                            procedure, the patient's blood pressure, pulse, and                            oxygen saturations were monitored continuously. The                            Model GIF-HQ190 [id number]) scope was introduced                            through the mouth, and advanced to the efferent                      jejunal loop. The upper GI endoscopy was                            accomplished without difficulty. The patient                            tolerated the procedure well. Scope In: Scope Out: Findings:                 The examined esophagus was normal.                           Evidence of a Roux-en-Y gastrojejunostomy was                            found. The gastrojejunal anastomosis was                            characterized by healthy appearing mucosa. This was                            traversed. The pouch-to-jejunum limb was                            characterized by healthy appearing mucosa.                           The cardia and gastric fundus were normal (post-op)                            on retroflexion. Complications:            No immediate complications. Estimated Blood Loss:     Estimated blood loss: none. Impression:               - Normal esophagus.                           - Roux-en-Y gastrojejunostomy with gastrojejunal                            anastomosis characterized by healthy appearing  mucosa. Z-line at 35 cm, G-J anastomosis at 40 cm                           - No specimens collected. Recommendation:           - See the other procedure note for documentation of                            additional recommendations.                           NOTE DOUBT NAUSEA IS GI IN ORIGIN AS IT IS CONSTANT                            AND ASSOC W/ HEADACHES, CNS SXS - VERTIGO +                            ADJUSTMENT D/O Gatha Mayer, MD 06/28/2018 11:47:01 AM This report has been signed electronically.

## 2018-06-28 NOTE — Op Note (Signed)
[facility] Patient Name: [name] Procedure Date: 06/28/2018 10:57 AM MRN: [id number] Endoscopist: Gatha Mayer , MD Age: 42 Referring MD:  Date of Birth: 05-05-1976 Gender: Female Account #: [id number] Procedure:                Colonoscopy Indications:              Left-sided chronic ulcerative colitis, Follow-up of                            left-sided chronic ulcerative colitis, Constipation Medicines:                Propofol per Anesthesia, Monitored Anesthesia Care Procedure:                Pre-Anesthesia Assessment:                           - Prior to the procedure, a History and Physical                            was performed, and patient medications and                            allergies were reviewed. The patient's tolerance of                            previous anesthesia was also reviewed. The risks                            and benefits of the procedure and the sedation                            options and risks were discussed with the patient.                            All questions were answered, and informed consent                            was obtained. Prior Anticoagulants: The patient has                            taken no previous anticoagulant or antiplatelet                            agents. ASA Grade Assessment: II - A patient with                            mild systemic disease. After reviewing the risks                            and benefits, the patient was deemed in                            satisfactory condition to undergo the procedure.  After obtaining informed consent, the colonoscope                            was passed under direct vision. Throughout the                            procedure, the patient's blood pressure, pulse, and                            oxygen saturations were monitored continuously. The                            Colonoscope was introduced through the anus and                     advanced to the the terminal ileum, with                            identification of the appendiceal orifice and IC                            valve. The colonoscopy was performed without                            difficulty. The patient tolerated the procedure                            well. The quality of the bowel preparation was                            excellent. The terminal ileum, ileocecal valve,                            appendiceal orifice, and rectum were photographed. Scope In: 11:11:12 AM Scope Out: 11:27:09 AM Scope Withdrawal Time: 0 hours 8 minutes 36 seconds  Total Procedure Duration: 0 hours 15 minutes 57 seconds  Findings:                 The perianal and digital rectal examinations were                            normal.                           The terminal ileum appeared normal.                           A localized area of mildly granular, inflamed and                            ulcerated mucosa was found appendiceal orifice.                            Biopsies were taken with a cold forceps for  histology. Verification of patient identification                            for the specimen was done. Estimated blood loss was                            minimal.                           Internal hemorrhoids were found during retroflexion.                           The exam was otherwise without abnormality on                            direct and retroflexion views.                           Biopsies for histology were taken with a cold                            forceps from the right colon, transverse colon,                            descending colon and sigmoid colon for evaluation                            of microscopic colitis. Complications:            No immediate complications. Estimated blood loss:                            Minimal. Estimated Blood Loss:     Estimated blood loss was minimal. Impression:                - The examined portion of the ileum was normal.                           - Granular, inflamed and ulcerated mucosa at the                            appendiceal orifice. Biopsied.                           - Internal hemorrhoids.                           - The examination was otherwise normal on direct                            and retroflexion views.                           - Biopsies were taken with a cold forceps from the  right colon, transverse colon, descending colon and                            sigmoid colon for evaluation of microscopic colitis. Recommendation:           - Patient has a contact number available for                            emergencies. The signs and symptoms of potential                            delayed complications were discussed with the                            patient. Return to normal activities tomorrow.                            Written discharge instructions were provided to the                            patient.                           - Resume previous diet.                           - Continue present medications.                           - Await pathology results.                           - ONCE PATH REVIEWED NEED TO CONSIDER FOLLOWING DUE                            TO NAUSEA AND VOMITING                           CHECK MICRONUTRIENT LEVELS                           ENT EVAL                           RETURN TO NEURO                           CHANGE TO REMERON                           ADDITIONAL SMALL BOWEL EVAL (? COULD SHE HAVE                            [facility]OHN[facility]) Gatha Mayer, MD 06/28/2018 11:51:54 AM This report has been signed electronically.

## 2018-06-28 NOTE — Progress Notes (Signed)
Called to room to assist during endoscopic procedure.  Patient ID and intended procedure confirmed with present staff. Received instructions for my participation in the procedure from the performing physician.  

## 2018-06-28 NOTE — Patient Instructions (Addendum)
I did not see any major problems - all ok except slight inflammation around the appendix.  Let me see the biopsy results and I will get back to you.  We may need to do some testing for micronutrient deficiencies, neurology follow-up makes sense and I have some idease about changing medications to help your nausea.  I appreciate the opportunity to care for you. Gatha Mayer, MD, FACG   YOU HAD AN ENDOSCOPIC PROCEDURE TODAY AT [facility]:   Refer to the procedure report that was given to you for any specific questions about what was found during the examination.  If the procedure report does not answer your questions, please call your gastroenterologist to clarify.  If you requested that your care partner not be given the details of your procedure findings, then the procedure report has been included in a sealed envelope for you to review at your convenience later.  YOU SHOULD EXPECT: Some feelings of bloating in the abdomen. Passage of more gas than usual.  Walking can help get rid of the air that was put into your GI tract during the procedure and reduce the bloating. If you had a lower endoscopy (such as a colonoscopy or flexible sigmoidoscopy) you may notice spotting of blood in your stool or on the toilet paper. If you underwent a bowel prep for your procedure, you may not have a normal bowel movement for a few days.  Please Note:  You might notice some irritation and congestion in your nose or some drainage.  This is from the oxygen used during your procedure.  There is no need for concern and it should clear up in a day or so.  SYMPTOMS TO REPORT IMMEDIATELY:   Following lower endoscopy (colonoscopy or flexible sigmoidoscopy):  Excessive amounts of blood in the stool  Significant tenderness or worsening of abdominal pains  Swelling of the abdomen that is new, acute  Fever of 100F or higher   Following upper endoscopy (EGD)  Vomiting of blood or coffee  ground material  New chest pain or pain under the shoulder blades  Painful or persistently difficult swallowing  New shortness of breath  Fever of 100F or higher  Black, tarry-looking stools  For urgent or emergent issues, a gastroenterologist can be reached at any hour by calling [phone].   DIET:  We do recommend a small meal at first, but then you may proceed to your regular diet.  Drink plenty of fluids but you should avoid alcoholic beverages for 24 hours.  ACTIVITY:  You should plan to take it easy for the rest of today and you should NOT DRIVE or use heavy machinery until tomorrow (because of the sedation medicines used during the test).    FOLLOW UP: Our staff will call the number listed on your records the next business day following your procedure to check on you and address any questions or concerns that you may have regarding the information given to you following your procedure. If we do not reach you, we will leave a message.  However, if you are feeling well and you are not experiencing any problems, there is no need to return our call.  We will assume that you have returned to your regular daily activities without incident.  If any biopsies were taken you will be contacted by phone or by letter within the next 1-3 weeks.  Please call us at [phone] if you have not heard about the biopsies in  3 weeks.    SIGNATURES/CONFIDENTIALITY: You and/or your care partner have signed paperwork which will be entered into your electronic medical record.  These signatures attest to the fact that that the information above on your After Visit Summary has been reviewed and is understood.  Full responsibility of the confidentiality of this discharge information lies with you and/or your care-partner.  Dr. Carlean Purl will be in touch.   Read all handouts give to you by your recovery room nurse.

## 2018-06-29 ENCOUNTER — Telehealth: Payer: Self-pay

## 2018-06-29 NOTE — Telephone Encounter (Signed)
  Follow up Call-  Call back number 06/28/2018  Post procedure Call Back phone  # [phone]  Permission to leave phone message Yes  Some recent data might be hidden     Patient questions:  Do you have a fever, pain , or abdominal swelling? No. Pain Score  0 *  Have you tolerated food without any problems? Yes.    Have you been able to return to your normal activities? Yes.    Do you have any questions about your discharge instructions: Diet   No. Medications  No. Follow up visit  No.  Do you have questions or concerns about your Care? No.  Actions: * If pain score is 4 or above: No action needed, pain <4.

## 2018-07-05 DIAGNOSIS — M5416 Radiculopathy, lumbar region: Secondary | ICD-10-CM | POA: Diagnosis not present

## 2018-07-05 DIAGNOSIS — M5412 Radiculopathy, cervical region: Secondary | ICD-10-CM | POA: Diagnosis not present

## 2018-07-05 DIAGNOSIS — M5136 Other intervertebral disc degeneration, lumbar region: Secondary | ICD-10-CM | POA: Diagnosis not present

## 2018-07-07 DIAGNOSIS — R269 Unspecified abnormalities of gait and mobility: Secondary | ICD-10-CM | POA: Diagnosis not present

## 2018-07-07 DIAGNOSIS — Z9884 Bariatric surgery status: Secondary | ICD-10-CM | POA: Diagnosis not present

## 2018-07-07 DIAGNOSIS — K922 Gastrointestinal hemorrhage, unspecified: Secondary | ICD-10-CM | POA: Diagnosis not present

## 2018-07-08 ENCOUNTER — Other Ambulatory Visit: Payer: Self-pay

## 2018-07-08 DIAGNOSIS — K909 Intestinal malabsorption, unspecified: Secondary | ICD-10-CM

## 2018-07-08 DIAGNOSIS — G43909 Migraine, unspecified, not intractable, without status migrainosus: Secondary | ICD-10-CM

## 2018-07-08 DIAGNOSIS — R112 Nausea with vomiting, unspecified: Secondary | ICD-10-CM

## 2018-07-08 NOTE — Progress Notes (Signed)
v

## 2018-07-08 NOTE — Progress Notes (Signed)
[facility] - no letter   - 5 year colon recall  Office  Tell her biopsies ok - just slight inflammation at appendix  She needs further testing to determine cause of nausea and other sxs  1) Vitamin A level, RBC Folate level, selenium level, zinc level, Thiamine (B1) level copper level  DX is malabsorption syndrome (s/p surgery)   2) See neurology re: hx migraine headaches with persistent headache and GI sxs

## 2018-07-11 ENCOUNTER — Other Ambulatory Visit: Payer: 59

## 2018-07-11 DIAGNOSIS — K909 Intestinal malabsorption, unspecified: Secondary | ICD-10-CM

## 2018-07-11 DIAGNOSIS — R112 Nausea with vomiting, unspecified: Secondary | ICD-10-CM

## 2018-07-12 DIAGNOSIS — Z09 Encounter for follow-up examination after completed treatment for conditions other than malignant neoplasm: Secondary | ICD-10-CM | POA: Diagnosis not present

## 2018-07-12 DIAGNOSIS — R110 Nausea: Secondary | ICD-10-CM | POA: Diagnosis not present

## 2018-07-13 LAB — ZINC: ZINC: 55 ug/dL — AB (ref 56–134)

## 2018-07-13 LAB — FOLATE RBC: Hematocrit: 35.8 % (ref 34.0–46.6)

## 2018-07-13 LAB — SELENIUM SERUM: Selenium, S/P: 109 ug/L (ref 91–198)

## 2018-07-13 LAB — VITAMIN A: VITAMIN A: 28.9 ug/dL (ref 20.1–62.0)

## 2018-07-13 LAB — CERULOPLASMIN: Ceruloplasmin: 28.1 mg/dL (ref 19.0–39.0)

## 2018-07-14 LAB — VITAMIN B1: Vitamin B1 (Thiamine): 8 nmol/L (ref 8–30)

## 2018-07-14 LAB — SELENIUM SERUM

## 2018-07-14 LAB — VITAMIN A

## 2018-07-14 LAB — FOLATE RBC

## 2018-07-14 LAB — CERULOPLASMIN

## 2018-07-15 ENCOUNTER — Other Ambulatory Visit: Payer: Self-pay | Admitting: Family Medicine

## 2018-07-15 ENCOUNTER — Ambulatory Visit (INDEPENDENT_AMBULATORY_CARE_PROVIDER_SITE_OTHER): Payer: 59 | Admitting: Family Medicine

## 2018-07-15 DIAGNOSIS — E538 Deficiency of other specified B group vitamins: Secondary | ICD-10-CM

## 2018-07-15 DIAGNOSIS — M545 Low back pain: Secondary | ICD-10-CM | POA: Diagnosis not present

## 2018-07-15 DIAGNOSIS — M542 Cervicalgia: Secondary | ICD-10-CM | POA: Diagnosis not present

## 2018-07-15 DIAGNOSIS — J4521 Mild intermittent asthma with (acute) exacerbation: Secondary | ICD-10-CM

## 2018-07-18 ENCOUNTER — Other Ambulatory Visit: Payer: 59

## 2018-07-18 ENCOUNTER — Ambulatory Visit (INDEPENDENT_AMBULATORY_CARE_PROVIDER_SITE_OTHER): Payer: 59 | Admitting: Family Medicine

## 2018-07-18 ENCOUNTER — Encounter: Payer: Self-pay | Admitting: Family Medicine

## 2018-07-18 VITALS — BP 121/70 | HR 73 | Temp 98.0°F | Ht 66.5 in | Wt 198.2 lb

## 2018-07-18 DIAGNOSIS — G894 Chronic pain syndrome: Secondary | ICD-10-CM

## 2018-07-18 DIAGNOSIS — Z9884 Bariatric surgery status: Secondary | ICD-10-CM

## 2018-07-18 DIAGNOSIS — R112 Nausea with vomiting, unspecified: Secondary | ICD-10-CM | POA: Diagnosis not present

## 2018-07-18 DIAGNOSIS — E559 Vitamin D deficiency, unspecified: Secondary | ICD-10-CM

## 2018-07-18 DIAGNOSIS — E538 Deficiency of other specified B group vitamins: Secondary | ICD-10-CM | POA: Diagnosis not present

## 2018-07-18 DIAGNOSIS — D508 Other iron deficiency anemias: Secondary | ICD-10-CM

## 2018-07-18 DIAGNOSIS — K909 Intestinal malabsorption, unspecified: Secondary | ICD-10-CM

## 2018-07-18 LAB — POCT CBC
Granulocyte percent: 50.5 %G (ref 37–80)
HCT, POC: 32.7 % (ref 29–41)
Hemoglobin: 10.9 g/dL (ref 9.5–13.5)
Lymph, poc: 3.3 (ref 0.6–3.4)
MCH, POC: 29.2 pg (ref 27–31.2)
MCHC: 33.4 g/dL (ref 31.8–35.4)
MCV: 87.6 fL (ref 76–111)
MID (cbc): 0.5 (ref 0–0.9)
MPV: 8.6 fL (ref 0–99.8)
POC Granulocyte: 3.9 (ref 2–6.9)
POC LYMPH PERCENT: 42.9 %L (ref 10–50)
POC MID %: 6.6 %M (ref 0–12)
Platelet Count, POC: 303 10*3/uL (ref 142–424)
RBC: 3.74 M/uL — AB (ref 4.04–5.48)
RDW, POC: 14.9 %
WBC: 7.7 10*3/uL (ref 4.6–10.2)

## 2018-07-18 MED ORDER — ONDANSETRON 8 MG PO TBDP: 8.0000 mg | ORAL_TABLET | Freq: Three times a day (TID) | ORAL | 5 refills | Status: DC | PRN

## 2018-07-18 MED ORDER — HYDROCODONE-ACETAMINOPHEN 10-325 MG PO TABS: 1.0000 | ORAL_TABLET | Freq: Four times a day (QID) | ORAL | 0 refills | Status: DC | PRN

## 2018-07-18 MED ORDER — VITAMIN D (ERGOCALCIFEROL) 1.25 MG (50000 UNIT) PO CAPS: 50000.0000 [IU] | ORAL_CAPSULE | ORAL | 0 refills | Status: DC

## 2018-07-18 NOTE — Progress Notes (Signed)
10/21/20193:46 PM  [name] [birth year], 42 y.o. female [id number]  Chief Complaint  Patient presents with  . Pain    follow up on chronic pain.   . Rash    rash started on the left hand, now has breakout out on the bk of the left leg and bruises    HPI:   Patient is a 42 y.o. 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, adjustment disorder with depression, UC who presents today for routine followup  Previous PCP Dr Tamala Julian Last visit July 2019  She is worried about recurring easy bruising wo any bumping Feels her left side is weak and swollen Left leg has been hurting more and jerking at night since her surgery She has not seen ortho about this yet, sees him in about a week  She has been also having increase blood in her stool since her colonoscopy on 06/28/2018, had 4 biopsies Not having any diarrhea No fever but gets chills  She has an appt with neuro in dec She has been having tremors of right arm and left leg  Chronic pain DDD of lumbar and cervical spine, left knee pain Requesting refill of vicodin pmp reviewed  Patient Care Team: Rutherford Guys, MD as PCP - General (Family Medicine) Leonie Man, MD as PCP - Cardiology (Cardiology) Paralee Cancel, MD as Consulting Physician (Orthopedic Surgery) Gatha Mayer, MD as Consulting Physician (Gastroenterology) Paralee Cancel, MD as Consulting Physician (Orthopedic Surgery) Crane, Francesco Sor, [place] as Dietitian (Dietician) [name], MD as Consulting Physician (Obstetrics and Gynecology) Alphonsa Overall, MD as Consulting Physician (General Surgery)  Fall Risk  07/18/2018 05/24/2018 04/22/2018 03/30/2018 02/28/2018  Falls in the past year? No No Yes Yes No  Number falls in past yr: - - 2 or more 2 or more -  Injury with Fall? - - - No -  Comment - - - - -  Follow up - - - - -     Depression screen [facility]  2/9 07/18/2018 04/22/2018 02/28/2018  Decreased Interest 0 0 0  Down, Depressed, Hopeless 0 0 0  PHQ - 2 Score 0 0 0  Altered sleeping - - -  Tired, decreased energy - - -  Change in appetite - - -  Feeling bad or failure about yourself  - - -  Trouble concentrating - - -  Moving slowly or fidgety/restless - - -  Suicidal thoughts - - -  PHQ-9 Score - - -  Difficult doing work/chores - - -  Some recent data might be hidden    Allergies  Allergen Reactions  . Coconut Oil Anaphylaxis and Itching  . Morphine And Related Anaphylaxis and Hives    Pt states that she has tolerated Norco and Dilaudid  . Oxycodone Anaphylaxis  . Ciprofloxacin Itching and Rash  . Doxycycline Nausea And Vomiting  . Adhesive [Tape] Rash    And paper tape causes a rash if wearing for a prolong period of time  . Penicillins Rash    Has patient had a PCN reaction causing immediate rash, facial/tongue/throat swelling, SOB or lightheadedness with hypotension: Yes Has patient had a PCN reaction causing severe rash involving mucus membranes or skin necrosis: No Has patient had a PCN reaction that required hospitalization No Has patient had a PCN reaction occurring within the last 10 years: No If all of the above answers are "  NO", then may proceed with Cephalosporin use.    Prior to Admission medications   Medication Sig Start Date End Date Taking? Authorizing Provider  albuterol (PROVENTIL HFA;VENTOLIN HFA) 108 (90 Base) MCG/ACT inhaler Inhale 2 puffs into the lungs every 6 (six) hours as needed for wheezing or shortness of breath. For shortness of breath Patient taking differently: Inhale 2 puffs into the lungs every 6 (six) hours as needed for wheezing or shortness of breath.  09/22/16  Yes Wardell Honour, MD  CALCIUM CITRATE PO 10 mLs by Feeding Tube route every other day. In the afternoon.   Yes [provider]  docusate sodium (COLACE) 100 MG capsule Take 1 capsule (100 mg total) by mouth 2 (two)  times daily. 03/11/18  Yes Babish, Rodman Key, PA-C  ferrous sulfate (FERROUSUL) 325 (65 FE) MG tablet Take 1 tablet (325 mg total) by mouth daily with breakfast. 05/24/18  Yes Rutherford Guys, MD  FLUoxetine (PROZAC) 20 MG/5ML solution Place 10 mLs (40 mg total) into feeding tube daily. 05/24/18  Yes Rutherford Guys, MD  gabapentin (NEURONTIN) 300 MG capsule Take 1 capsule (300 mg total) by mouth 2 (two) times daily AND 2 capsules (600 mg total) at bedtime. 05/24/18  Yes Rutherford Guys, MD  HYDROcodone-acetaminophen [facility]) 10-325 MG tablet Take 1 tablet by mouth every 6 (six) hours as needed for severe pain. 05/24/18  Yes Rutherford Guys, MD  linaclotide [facility]) 290 MCG CAPS capsule Take 1 capsule (290 mcg total) by mouth daily before breakfast. 02/28/18  Yes Wardell Honour, MD  methocarbamol (ROBAXIN) 500 MG tablet Take 1 tablet (500 mg total) by mouth every 6 (six) hours as needed for muscle spasms. 03/15/18  Yes Babish, Rodman Key, PA-C  Multiple Vitamin (MULTIVITAMIN) LIQD Place 10 mLs into feeding tube daily. Bariatric Liquid Multivitamin    Yes [provider]  Nutritional Supplements (VITAL HP 1.0 CAL) LIQD 65 mL/hr by Feeding Tube route continuous.   Yes [provider]  ondansetron (ZOFRAN-ODT) 8 MG disintegrating tablet Take 1 tablet (8 mg total) by mouth every 8 (eight) hours as needed for nausea. 12/15/17  Yes Wardell Honour, MD  pantoprazole (PROTONIX) 40 MG tablet Take 1 tablet (40 mg total) by mouth 2 (two) times daily before a meal. 12/15/17  Yes Wardell Honour, MD  polyethylene glycol (MIRALAX / GLYCOLAX) packet Take 17 g by mouth 2 (two) times daily. 03/11/18  Yes Babish, Rodman Key, PA-C  thiamine (VITAMIN B-1) 100 MG tablet Take 100 mg by mouth daily at 12 noon.    Yes [provider]  Vitamin D, Ergocalciferol, (DRISDOL) 50000 units CAPS capsule TAKE 1 CAPSULE (50,000 UNITS TOTAL) BY MOUTH EVERY 7 (SEVEN) DAYS. 03/15/18  Yes Wardell Honour, MD    Past Medical  History:  Diagnosis Date  . Anal fissure - posterior 10/16/2014    OCC  ISSUES  . Anxiety    doesn't take anything  . Asthma    has Albuterol inhaler as needed  . Boil    on pubic area started septra 03-01-17 draining blood and pus  . Bronchitis   . Chronic headache disorder 07/22/2016  . Clostridium difficile infection 04/20/2012  . Colitis   . Dehydration   . Depression   . Family history of adverse reaction to anesthesia    pt mom gets sick  . GERD (gastroesophageal reflux disease)    takes Pantoprazole daily  . Headache   . Hiatal hernia    neuropathy -  mild in arms and legs  . History of blood transfusion    last transfusion was 04/04/2016=Benadryl was given d/t itching. States she always itches with transfusion.   Marland Kitchen History of bronchitis    > 2 yrs ago  . History of colon polyps    benign  . History of migraine    last one 05/01/16  . History of stomach ulcers   . History of urinary tract infection LAST 2 WEEKS AGO  . IDA (iron deficiency anemia)   . Internal and external bleeding hemorrhoids 06/11/2014  . Joint pain   . Joint swelling   . Left sided chronic colitis - segmental 06/11/2014  . Migraine   . Motion sickness   . Nausea    takes Zofran as needed  . Nausea and vomiting    for 1 year  . Obesity   . Oligouria   . Osteoarthritis    lower back, knees, wrists - no meds  . Pneumonia 1997  . Postoperative nausea and vomiting 01/21/2016   wants scopolamine patch  . SVD (spontaneous vaginal delivery)    x 4  . Tingling    BOTH LOWER EXTREMETIES ALL THE TIME DUE TO RAPID WEIGHT LOSS  . TOBACCO USER 10/02/2009   Qualifier: Diagnosis of  By: Dimas Millin MD, Ellard Artis    . Transfusion history    last transfusion 6'16   . UC (ulcerative colitis) ([place])    supposed to be taking Lialda and Bentyl but has been off since gastric sleeve  . Vertigo    doesn't take any meds    Past Surgical History:  Procedure Laterality Date  . ABDOMINAL HYSTERECTOMY     PARTIAL  .  COLONOSCOPY  2007   for rectal bleeding; [facility]  . COLONOSCOPY N/A 06/11/2014   Procedure: COLONOSCOPY;  Surgeon: Gatha Mayer, MD;  Location: [facility];  Service: Endoscopy;  Laterality: N/A;  . COLONOSCOPY WITH PROPOFOL N/A 03/02/2017   Procedure: COLONOSCOPY WITH PROPOFOL;  Surgeon: Alphonsa Overall, MD;  Location: [facility];  Service: General;  Laterality: N/A;  . DILATION AND CURETTAGE OF UTERUS    . ESOPHAGOGASTRODUODENOSCOPY (EGD) WITH PROPOFOL N/A 04/06/2016   Procedure: ESOPHAGOGASTRODUODENOSCOPY (EGD) WITH PROPOFOL;  Surgeon: Alphonsa Overall, MD;  Location: [facility];  Service: General;  Laterality: N/A;  . ESOPHAGOGASTRODUODENOSCOPY (EGD) WITH PROPOFOL N/A 03/02/2017   Procedure: ESOPHAGOGASTRODUODENOSCOPY (EGD) WITH PROPOFOL;  Surgeon: Alphonsa Overall, MD;  Location: [facility];  Service: General;  Laterality: N/A;  . ESOPHAGOGASTRODUODENOSCOPY (EGD) WITH PROPOFOL N/A 05/20/2017   Procedure: ESOPHAGOGASTRODUODENOSCOPY (EGD) WITH PROPOFOL ERAS PATHWAY;  Surgeon: Alphonsa Overall, MD;  Location: [facility];  Service: General;  Laterality: N/A;  . ESOPHAGOGASTRODUODENOSCOPY (EGD) WITH PROPOFOL N/A 10/07/2017   Procedure: ESOPHAGOGASTRODUODENOSCOPY (EGD) WITH PROPOFOL;  Surgeon: Alphonsa Overall, MD;  Location: [facility];  Service: General;  Laterality: N/A;  . EXCISION OF SKIN TAG  06/08/2017   Procedure: EXCISION OF VULVAR SKIN TAGS X2;  Surgeon: [name], MD;  Location: [facility];  Service: Gynecology;;  . FOOT SURGERY Bilateral    x 2  . GASTRIC ROUX-EN-Y N/A 12/14/2016   Procedure: LAPAROSCOPIC REVISION SLEEVE GASTRECTOMY TO  ROUX-Y-GASTRIC BY-PASS, UPPER ENDO;  Surgeon: Excell Seltzer, MD;  Location: [facility];  Service: General;  Laterality: N/A;  . GASTROJEJUNOSTOMY N/A 05/11/2016   Procedure: LAPAROSCOPIC PLACEMENT  OF FEEDING  JEJUNOSTOMY TUBE;  Surgeon: Excell Seltzer, MD;  Location: [facility];  Service: General;  Laterality: N/A;  . HEMORRHOIDECTOMY WITH HEMORRHOID BANDING     .  IR FLUORO GUIDE CV LINE RIGHT  04/06/2017  . IR GENERIC HISTORICAL  05/19/2016   IR CM INJ ANY COLONIC TUBE W/FLUORO 05/19/2016 Aletta Edouard, MD [facility]  . IR PATIENT EVAL TECH 0-60 MINS  11/30/2017  . IR REPLC DUODEN/JEJUNO TUBE PERCUT W/FLUORO  03/14/2018  . IR REPLC DUODEN/JEJUNO TUBE PERCUT W/FLUORO  03/15/2018  . IR US GUIDE VASC ACCESS RIGHT  04/06/2017  . j tube removed march 2018    . KNEE ARTHROSCOPY Left 09/06/2014  . LAPAROSCOPIC GASTRIC SLEEVE RESECTION N/A 01/14/2016   Procedure: LAPAROSCOPIC GASTRIC SLEEVE RESECTION;  Surgeon: Excell Seltzer, MD;  Location: [facility];  Service: General;  Laterality: N/A;  . LAPAROSCOPIC REVISION OF GASTROJEJUNOSTOMY N/A 10/25/2017   Procedure: LAPAROSCOPIC REVISION OF GASTROJEJUNOSTOMY AND PARTIAL GASTRECTOMY, WITH PLACEMENT OF FEEDING GASTROSTOMY TUBE;  Surgeon: Excell Seltzer, MD;  Location: [facility];  Service: General;  Laterality: N/A;  . LAPAROSCOPIC TUBAL LIGATION  10/16/2011   Procedure: LAPAROSCOPIC TUBAL LIGATION;  Surgeon: Alwyn Pea, MD;  Location: [facility];  Service: Gynecology;  Laterality: Bilateral;  . NOVASURE ABLATION  09/28/2010   mild persistent vaginal bleeding  . right knee arthroscopy     05/07/2016  . TOTAL KNEE ARTHROPLASTY Left 03/10/2018   Procedure: LEFT TOTAL KNEE ARTHROPLASTY;  Surgeon: Paralee Cancel, MD;  Location: [facility];  Service: Orthopedics;  Laterality: Left;  70 mins  . TUBAL LIGATION    . VAGINAL HYSTERECTOMY Bilateral 06/08/2017   Procedure: HYSTERECTOMY VAGINAL W/ BILATERAL SALPINGECTOMY;  Surgeon: [name], MD;  Location: [facility];  Service: Gynecology;  Laterality: Bilateral;  . wisdom teeth extracted      Social History   Tobacco Use  . Smoking status: Former Smoker    Packs/day: 0.50    Years: 20.00    Pack years: 10.00    Types: Cigarettes    Last attempt to quit: 03/17/2014    Years since quitting: 4.3  . Smokeless tobacco: Never Used  Substance Use Topics  . Alcohol use: No     Alcohol/week: 0.0 standard drinks    Family History  Problem Relation Age of Onset  . Hypertension Mother   . Diabetes Mother   . Sarcoidosis Mother        lungs and skin  . Asthma Mother   . Hypertension Father   . Diabetes Father   . Asthma Son   . Tics Son   . Asthma Sister   . Cancer Sister        possible pancreatic cancer  . Adrenal disorder Sister        Tumor   . Asthma Brother   . Asthma Daughter        died age 14.5  . Cancer Daughter 4       brain; died age 14.5  . Asthma Son   . Cancer Paternal Aunt        brain, colon, lung and esophagus; unsure of primary     Review of Systems  Constitutional: Positive for chills. Negative for fever.  Respiratory: Negative for cough and shortness of breath.   Cardiovascular: Negative for chest pain and palpitations.   Per hpi  OBJECTIVE:  Blood pressure 121/70, pulse 73, temperature 98 F (36.7 C), temperature source Oral, height 5' 6.5" (1.689 m), weight 198 lb 3.2 oz (89.9 kg), last menstrual period 06/02/2017, SpO2 98 %. Body mass index is 31.51 kg/m.   Physical Exam  Constitutional: She is oriented to person, place, and time. She appears  well-developed and well-nourished.  HENT:  Head: Normocephalic and atraumatic.  Mouth/Throat: Oropharynx is clear and moist. No oropharyngeal exudate.  Eyes: Pupils are equal, round, and reactive to light. Conjunctivae and EOM are normal. No scleral icterus.  Neck: Neck supple.  Cardiovascular: Normal rate, regular rhythm and normal heart sounds. Exam reveals no gallop and no friction rub.  No murmur heard. Pulmonary/Chest: Effort normal and breath sounds normal. She has no wheezes. She has no rales.  Abdominal: Soft. Bowel sounds are normal. There is tenderness (RLQ). There is no rebound and no guarding.  g tube dressing c/d/i  Musculoskeletal: She exhibits no edema.  Neurological: She is alert and oriented to person, place, and time.  Skin: Skin is warm and dry.    Psychiatric: She has a normal mood and affect.  Nursing note and vitals reviewed.  Results for orders placed or performed in visit on 07/18/18 (from the past 24 hour(s))  POCT CBC     Status: Abnormal   Collection Time: 07/18/18  4:18 PM  Result Value Ref Range   WBC 7.7 4.6 - 10.2 K/uL   Lymph, poc 3.3 0.6 - 3.4   POC LYMPH PERCENT 42.9 10 - 50 %L   MID (cbc) 0.5 0 - 0.9   POC MID % 6.6 0 - 12 %M   POC Granulocyte 3.9 2 - 6.9   Granulocyte percent 50.5 37 - 80 %G   RBC 3.74 (A) 4.04 - 5.48 M/uL   Hemoglobin 10.9 9.5 - 13.5 g/dL   HCT, POC 32.7 29 - 41 %   MCV 87.6 76 - 111 fL   MCH, POC 29.2 27 - 31.2 pg   MCHC 33.4 31.8 - 35.4 g/dL   RDW, POC 14.9 %   Platelet Count, POC 303 142 - 424 K/uL   MPV 8.6 0 - 99.8 fL    ASSESSMENT and PLAN  1. Iron deficiency anemia secondary to inadequate dietary iron intake Checking labs today, medications will be adjusted as needed.  - POCT CBC - Iron, TIBC and Ferritin Panel  2. Vitamin D deficiency Checking labs today, medications will be adjusted as needed.  - Vitamin D, 25-hydroxy  3. Vitamin B12 deficiency Checking labs today, medications will be adjusted as needed.  - Vitamin B12  4. S/P laparoscopic sleeve gastrectomy  5. Chronic pain syndrome pmp reviewed, med refilled  Other orders - HYDROcodone-acetaminophen (NORCO) 10-325 MG tablet; Take 1 tablet by mouth every 6 (six) hours as needed for severe pain. - Vitamin D, Ergocalciferol, (DRISDOL) 50000 units CAPS capsule; Take 1 capsule (50,000 Units total) by mouth every 7 (seven) days. - ondansetron (ZOFRAN-ODT) 8 MG disintegrating tablet; Take 1 tablet (8 mg total) by mouth every 8 (eight) hours as needed for nausea.  Return in about 3 months (around 10/18/2018).    Rutherford Guys, MD [facility] at [facility], [place] [address] Ph.  [phone] Fax [phone]

## 2018-07-18 NOTE — Patient Instructions (Signed)
° ° ° °  If you have lab work done today you will be contacted with your lab results within the next 2 weeks.  If you have not heard from us then please contact us. The fastest way to get your results is to register for My Chart. ° ° °IF you received an x-ray today, you will receive an invoice from [facility]. Please contact [facility] at [phone] with questions or concerns regarding your invoice.  ° °IF you received labwork today, you will receive an invoice from [facility]. Please contact [facility] at [phone] with questions or concerns regarding your invoice.  ° °Our billing staff will not be able to assist you with questions regarding bills from these companies. ° °You will be contacted with the lab results as soon as they are available. The fastest way to get your results is to activate your My Chart account. Instructions are located on the last page of this paperwork. If you have not heard from us regarding the results in 2 weeks, please contact this office. °  ° ° ° °

## 2018-07-19 ENCOUNTER — Telehealth: Payer: Self-pay

## 2018-07-19 LAB — VITAMIN D 25 HYDROXY (VIT D DEFICIENCY, FRACTURES): Vit D, 25-Hydroxy: 14.9 ng/mL — ABNORMAL LOW (ref 30.0–100.0)

## 2018-07-19 LAB — IRON,TIBC AND FERRITIN PANEL
Ferritin: 19 ng/mL (ref 15–150)
Iron Saturation: 24 % (ref 15–55)
Iron: 66 ug/dL (ref 27–159)
Total Iron Binding Capacity: 271 ug/dL (ref 250–450)
UIBC: 205 ug/dL (ref 131–425)

## 2018-07-19 LAB — VITAMIN B12: Vitamin B-12: 1092 pg/mL (ref 232–1245)

## 2018-07-19 NOTE — Telephone Encounter (Signed)
Pt picked up controlled meds on 10/8. Per [place], pt is not to pick up the rx that was written yesterday for Hydrocodone until 11/8. Pharmacy has already been notified

## 2018-07-20 ENCOUNTER — Other Ambulatory Visit: Payer: Self-pay

## 2018-07-20 ENCOUNTER — Other Ambulatory Visit: Payer: 59

## 2018-07-20 DIAGNOSIS — K909 Intestinal malabsorption, unspecified: Secondary | ICD-10-CM | POA: Diagnosis not present

## 2018-07-20 DIAGNOSIS — R112 Nausea with vomiting, unspecified: Secondary | ICD-10-CM | POA: Diagnosis not present

## 2018-07-20 DIAGNOSIS — M25511 Pain in right shoulder: Secondary | ICD-10-CM | POA: Diagnosis not present

## 2018-07-20 LAB — SPECIMEN STATUS REPORT

## 2018-07-20 MED ORDER — ALBUTEROL SULFATE HFA 108 (90 BASE) MCG/ACT IN AERS: 2.0000 | INHALATION_SPRAY | Freq: Four times a day (QID) | RESPIRATORY_TRACT | 0 refills | Status: DC | PRN

## 2018-07-20 MED ORDER — METOCLOPRAMIDE HCL 10 MG PO TABS: 10.0000 mg | ORAL_TABLET | Freq: Three times a day (TID) | ORAL | 1 refills | Status: DC

## 2018-07-20 NOTE — Progress Notes (Signed)
So far the micronutrient levels are ok Looks like RBC folate pending Also was supposed to get a serum copper level and I see ceruloplasmin ordered and cancelled  1) reorder copper level 2) offer metaclopramide 10 mg qac # 90 1 refill 3) f/u me next available 4) also ask her to see ENT to exclude any inner ear causes of nausea and vomiting  Referral should say persistent nausea and vomiting - r/o inner ear causes (I know she sees neuro 12/4 - need to do multiple evaluations)

## 2018-07-20 NOTE — Addendum Note (Signed)
Addended by: Marlon Pel on: 07/20/2018 11:56 AM   Modules accepted: Orders

## 2018-07-21 LAB — FOLATE RBC
FOLATE, RBC: 927 ng/mL (ref >498)
Folate, Hemolysate: 329.9 ng/mL
HEMATOCRIT: 34.4 % (ref 34.0–46.6)
Hematocrit: 35.6 % (ref 34.0–46.6)

## 2018-07-23 LAB — COPPER, SERUM: COPPER: 122 ug/dL (ref 70–175)

## 2018-07-25 DIAGNOSIS — M7062 Trochanteric bursitis, left hip: Secondary | ICD-10-CM | POA: Diagnosis not present

## 2018-07-25 DIAGNOSIS — M1712 Unilateral primary osteoarthritis, left knee: Secondary | ICD-10-CM | POA: Diagnosis not present

## 2018-07-25 DIAGNOSIS — M7061 Trochanteric bursitis, right hip: Secondary | ICD-10-CM | POA: Diagnosis not present

## 2018-07-29 ENCOUNTER — Encounter: Payer: Self-pay | Admitting: Family Medicine

## 2018-07-29 ENCOUNTER — Other Ambulatory Visit: Payer: Self-pay | Admitting: Family Medicine

## 2018-07-29 MED ORDER — HYDROCORTISONE 2.5 % RE CREA: 1.0000 "application " | TOPICAL_CREAM | Freq: Two times a day (BID) | RECTAL | 0 refills | Status: DC

## 2018-07-29 NOTE — Progress Notes (Signed)
Additional labs ok My Chart message Call for Dec appt

## 2018-07-30 DIAGNOSIS — M25511 Pain in right shoulder: Secondary | ICD-10-CM | POA: Diagnosis not present

## 2018-08-02 DIAGNOSIS — Z9884 Bariatric surgery status: Secondary | ICD-10-CM | POA: Diagnosis not present

## 2018-08-02 DIAGNOSIS — H9313 Tinnitus, bilateral: Secondary | ICD-10-CM | POA: Diagnosis not present

## 2018-08-02 DIAGNOSIS — R42 Dizziness and giddiness: Secondary | ICD-10-CM | POA: Diagnosis not present

## 2018-08-02 DIAGNOSIS — Z0110 Encounter for examination of ears and hearing without abnormal findings: Secondary | ICD-10-CM | POA: Diagnosis not present

## 2018-08-04 DIAGNOSIS — M25511 Pain in right shoulder: Secondary | ICD-10-CM | POA: Diagnosis not present

## 2018-08-04 DIAGNOSIS — G894 Chronic pain syndrome: Secondary | ICD-10-CM | POA: Diagnosis not present

## 2018-08-05 DIAGNOSIS — K922 Gastrointestinal hemorrhage, unspecified: Secondary | ICD-10-CM | POA: Diagnosis not present

## 2018-08-05 DIAGNOSIS — R269 Unspecified abnormalities of gait and mobility: Secondary | ICD-10-CM | POA: Diagnosis not present

## 2018-08-05 DIAGNOSIS — Z9884 Bariatric surgery status: Secondary | ICD-10-CM | POA: Diagnosis not present

## 2018-08-10 DIAGNOSIS — M25511 Pain in right shoulder: Secondary | ICD-10-CM | POA: Diagnosis not present

## 2018-08-11 ENCOUNTER — Ambulatory Visit (INDEPENDENT_AMBULATORY_CARE_PROVIDER_SITE_OTHER): Payer: 59 | Admitting: Family Medicine

## 2018-08-11 ENCOUNTER — Encounter: Payer: Self-pay | Admitting: Family Medicine

## 2018-08-11 VITALS — BP 131/92 | HR 70 | Wt 193.6 lb

## 2018-08-11 DIAGNOSIS — B3731 Acute candidiasis of vulva and vagina: Secondary | ICD-10-CM

## 2018-08-11 DIAGNOSIS — Z01419 Encounter for gynecological examination (general) (routine) without abnormal findings: Secondary | ICD-10-CM

## 2018-08-11 DIAGNOSIS — Z113 Encounter for screening for infections with a predominantly sexual mode of transmission: Secondary | ICD-10-CM

## 2018-08-11 DIAGNOSIS — B373 Candidiasis of vulva and vagina: Secondary | ICD-10-CM

## 2018-08-11 MED ORDER — FLUCONAZOLE 150 MG PO TABS: 150.0000 mg | ORAL_TABLET | Freq: Every day | ORAL | 2 refills | Status: DC

## 2018-08-11 NOTE — Patient Instructions (Signed)
Preventive Care 18-39 Years, Female Preventive care refers to lifestyle choices and visits with your health care provider that can promote health and wellness. What does preventive care include?  A yearly physical exam. This is also called an annual well check.  Dental exams once or twice a year.  Routine eye exams. Ask your health care provider how often you should have your eyes checked.  Personal lifestyle choices, including: ? Daily care of your teeth and gums. ? Regular physical activity. ? Eating a healthy diet. ? Avoiding tobacco and drug use. ? Limiting alcohol use. ? Practicing safe sex. ? Taking vitamin and mineral supplements as recommended by your health care provider. What happens during an annual well check? The services and screenings done by your health care provider during your annual well check will depend on your age, overall health, lifestyle risk factors, and family history of disease. Counseling Your health care provider may ask you questions about your:  Alcohol use.  Tobacco use.  Drug use.  Emotional well-being.  Home and relationship well-being.  Sexual activity.  Eating habits.  Work and work Statistician.  Method of birth control.  Menstrual cycle.  Pregnancy history.  Screening You may have the following tests or measurements:  Height, weight, and BMI.  Diabetes screening. This is done by checking your blood sugar (glucose) after you have not eaten for a while (fasting).  Blood pressure.  Lipid and cholesterol levels. These may be checked every 5 years starting at age 38.  Skin check.  Hepatitis C blood test.  Hepatitis B blood test.  Sexually transmitted disease (STD) testing.  BRCA-related cancer screening. This may be done if you have a family history of breast, ovarian, tubal, or peritoneal cancers.  Pelvic exam and Pap test. This may be done every 3 years starting at age 38. Starting at age 30, this may be done  every 5 years if you have a Pap test in combination with an HPV test.  Discuss your test results, treatment options, and if necessary, the need for more tests with your health care provider. Vaccines Your health care provider may recommend certain vaccines, such as:  Influenza vaccine. This is recommended every year.  Tetanus, diphtheria, and acellular pertussis (Tdap, Td) vaccine. You may need a Td booster every 10 years.  Varicella vaccine. You may need this if you have not been vaccinated.  HPV vaccine. If you are 39 or younger, you may need three doses over 6 months.  Measles, mumps, and rubella (MMR) vaccine. You may need at least one dose of MMR. You may also need a second dose.  Pneumococcal 13-valent conjugate (PCV13) vaccine. You may need this if you have certain conditions and were not previously vaccinated.  Pneumococcal polysaccharide (PPSV23) vaccine. You may need one or two doses if you smoke cigarettes or if you have certain conditions.  Meningococcal vaccine. One dose is recommended if you are age 68-21 years and a first-year college student living in a residence hall, or if you have one of several medical conditions. You may also need additional booster doses.  Hepatitis A vaccine. You may need this if you have certain conditions or if you travel or work in places where you may be exposed to hepatitis A.  Hepatitis B vaccine. You may need this if you have certain conditions or if you travel or work in places where you may be exposed to hepatitis B.  Haemophilus influenzae type b (Hib) vaccine. You may need this  if you have certain risk factors.  Talk to your health care provider about which screenings and vaccines you need and how often you need them. This information is not intended to replace advice given to you by your health care provider. Make sure you discuss any questions you have with your health care provider. Document Released: 11/10/2001 Document Revised:  06/03/2016 Document Reviewed: 07/16/2015 Elsevier Interactive Patient Education  2018 Elsevier Inc.  

## 2018-08-11 NOTE — Progress Notes (Signed)
  Subjective:     [name] is a 42 y.o. female and is here for a comprehensive physical exam. The patient reports problems - vaginal dryness, itching. Has been sexually active recently. Desires STD testing. Recent feeding tube placement and knee replacement .  The following portions of the patient's history were reviewed and updated as appropriate: allergies, current medications, past family history, past medical history, past social history, past surgical history and problem list.  Review of Systems Pertinent items noted in HPI and remainder of comprehensive ROS otherwise negative.   Objective:    BP (!) 131/92   Pulse 70   Wt 193 lb 9.6 oz (87.8 kg)   LMP 06/02/2017 Comment: hysterectomy 06/08/2017  BMI 30.78 kg/m  General appearance: alert, cooperative and appears stated age Head: Normocephalic, without obvious abnormality, atraumatic Neck: no adenopathy, supple, symmetrical, trachea midline and thyroid not enlarged, symmetric, no tenderness/mass/nodules Lungs: clear to auscultation bilaterally Breasts: normal appearance, no masses or tenderness Heart: regular rate and rhythm, S1, S2 normal, no murmur, click, rub or gallop Abdomen: soft, non-tender; bowel sounds normal; no masses,  no organomegaly Pelvic: cervix normal in appearance, external genitalia normal, no adnexal masses or tenderness, no cervical motion tenderness, uterus normal size, shape, and consistency and thick discharge noted Extremities: extremities normal, atraumatic, no cyanosis or edema Pulses: 2+ and symmetric Skin: Skin color, texture, turgor normal. No rashes or lesions Lymph nodes: Cervical, supraclavicular, and axillary nodes normal. Neurologic: Grossly normal    Assessment:     GYN female exam.      Plan:   Problem List Items Addressed This Visit    None    Visit Diagnoses    Screening examination for STD (sexually transmitted disease)    -  Primary   Relevant Orders   Cervicovaginal  ancillary only( )   HIV antibody (with reflex)   RPR   Hepatitis B Surface AntiGEN   Hepatitis C Antibody   Encounter for gynecological examination without abnormal finding       Relevant Orders   MM DIGITAL SCREENING BILATERAL   Yeast vaginitis       Relevant Medications   fluconazole (DIFLUCAN) 150 MG tablet     Return in 1 year (on 08/12/2019).    See After Visit Summary for Counseling Recommendations

## 2018-08-12 LAB — HIV ANTIBODY (ROUTINE TESTING W REFLEX): HIV Screen 4th Generation wRfx: NONREACTIVE

## 2018-08-12 LAB — CERVICOVAGINAL ANCILLARY ONLY
CHLAMYDIA, DNA PROBE: NEGATIVE
NEISSERIA GONORRHEA: NEGATIVE
Trichomonas: NEGATIVE

## 2018-08-12 LAB — RPR: RPR: NONREACTIVE

## 2018-08-12 LAB — HEPATITIS B SURFACE ANTIGEN: Hepatitis B Surface Ag: NEGATIVE

## 2018-08-12 LAB — HEPATITIS C ANTIBODY: Hep C Virus Ab: 0.1 s/co ratio (ref 0.0–0.9)

## 2018-08-31 ENCOUNTER — Ambulatory Visit (INDEPENDENT_AMBULATORY_CARE_PROVIDER_SITE_OTHER): Payer: 59 | Admitting: Neurology

## 2018-08-31 ENCOUNTER — Encounter: Payer: Self-pay | Admitting: Neurology

## 2018-08-31 ENCOUNTER — Other Ambulatory Visit: Payer: Self-pay

## 2018-08-31 VITALS — BP 113/76 | HR 74 | Resp 14 | Ht 66.5 in | Wt 193.0 lb

## 2018-08-31 DIAGNOSIS — K922 Gastrointestinal hemorrhage, unspecified: Secondary | ICD-10-CM | POA: Diagnosis not present

## 2018-08-31 DIAGNOSIS — R51 Headache: Secondary | ICD-10-CM

## 2018-08-31 DIAGNOSIS — R251 Tremor, unspecified: Secondary | ICD-10-CM

## 2018-08-31 DIAGNOSIS — G8929 Other chronic pain: Secondary | ICD-10-CM

## 2018-08-31 DIAGNOSIS — R269 Unspecified abnormalities of gait and mobility: Secondary | ICD-10-CM | POA: Diagnosis not present

## 2018-08-31 DIAGNOSIS — R519 Headache, unspecified: Secondary | ICD-10-CM

## 2018-08-31 DIAGNOSIS — Z9884 Bariatric surgery status: Secondary | ICD-10-CM | POA: Diagnosis not present

## 2018-08-31 HISTORY — DX: Tremor, unspecified: R25.1

## 2018-08-31 MED ORDER — NORTRIPTYLINE HCL 10 MG/5ML PO SOLN: ORAL | 3 refills | Status: DC

## 2018-08-31 NOTE — Progress Notes (Signed)
Reason for visit: Headache  Referring physician: Dr. Fanny Skates [name] is a 42 y.o. female  History of present illness:  [name] is a 42 year old right-handed black female with a history of morbid obesity, status post bariatric surgery.  Following surgery, she had a severe weight loss issue, she currently requires a feeding tube for her nutrition.  The patient has come to this office with a lifelong history of headache, over the last several years the headache has become daily in nature.  The headache is in the temporal regions, right equal to left, the pain may be in the eye itself at times.  The patient may have nausea and vomiting and photophobia and phonophobia with the headache.  The patient also has chronic neck and shoulder discomfort, and chronic low back pain.  She takes hydrocodone, 1 tablet 4 times daily, she takes gabapentin without full improvement.  The patient will take Tylenol if needed for the headache without benefit, she denies any significant caffeine intake during the day.  The patient over the last 6 months has noted some tremors involving the arms, right greater than left.  The patient is on Reglan, but she states she has only been on this for several months.  The patient is not sleeping well, she has chronic insomnia.  The patient may have cramping in the legs at night, she has to get up and walk frequently.  Otherwise she reports some more persistent numbness and paresthesias in the right arm, EMG study done in the past shows a mild peripheral neuropathy.  MRI of the brain and cervical spine done 2 years ago were completely normal.  Past Medical History:  Diagnosis Date  . Anal fissure - posterior 10/16/2014    OCC  ISSUES  . Anxiety    doesn't take anything  . Asthma    has Albuterol inhaler as needed  . Boil    on pubic area started septra 03-01-17 draining blood and pus  . Bronchitis   . Chronic headache disorder 07/22/2016  . Clostridium difficile  infection 04/20/2012  . Colitis   . Dehydration   . Depression   . Family history of adverse reaction to anesthesia    pt mom gets sick  . GERD (gastroesophageal reflux disease)    takes Pantoprazole daily  . Headache   . Hiatal hernia    neuropathy - mild in arms and legs  . History of blood transfusion    last transfusion was 04/04/2016=Benadryl was given d/t itching. States she always itches with transfusion.   Marland Kitchen History of bronchitis    > 2 yrs ago  . History of colon polyps    benign  . History of migraine    last one 05/01/16  . History of stomach ulcers   . History of urinary tract infection LAST 2 WEEKS AGO  . IDA (iron deficiency anemia)   . Internal and external bleeding hemorrhoids 06/11/2014  . Joint pain   . Joint swelling   . Left sided chronic colitis - segmental 06/11/2014  . Migraine   . Motion sickness   . Nausea    takes Zofran as needed  . Nausea and vomiting    for 1 year  . Obesity   . Oligouria   . Osteoarthritis    lower back, knees, wrists - no meds  . Pneumonia 1997  . Postoperative nausea and vomiting 01/21/2016   wants scopolamine patch  . SVD (spontaneous vaginal delivery)  x 4  . Tingling    BOTH LOWER EXTREMETIES ALL THE TIME DUE TO RAPID WEIGHT LOSS  . TOBACCO USER 10/02/2009   Qualifier: Diagnosis of  By: Dimas Millin MD, Ellard Artis    . Transfusion history    last transfusion 6'16   . UC (ulcerative colitis) ([place])    supposed to be taking Lialda and Bentyl but has been off since gastric sleeve  . Vertigo    doesn't take any meds    Past Surgical History:  Procedure Laterality Date  . ABDOMINAL HYSTERECTOMY     PARTIAL  . COLONOSCOPY  2007   for rectal bleeding; [facility]  . COLONOSCOPY N/A 06/11/2014   Procedure: COLONOSCOPY;  Surgeon: Gatha Mayer, MD;  Location: [facility];  Service: Endoscopy;  Laterality: N/A;  . COLONOSCOPY WITH PROPOFOL N/A 03/02/2017   Procedure: COLONOSCOPY WITH PROPOFOL;  Surgeon: Alphonsa Overall, MD;  Location: [facility];  Service: General;  Laterality: N/A;  . DILATION AND CURETTAGE OF UTERUS    . ESOPHAGOGASTRODUODENOSCOPY (EGD) WITH PROPOFOL N/A 04/06/2016   Procedure: ESOPHAGOGASTRODUODENOSCOPY (EGD) WITH PROPOFOL;  Surgeon: Alphonsa Overall, MD;  Location: [facility];  Service: General;  Laterality: N/A;  . ESOPHAGOGASTRODUODENOSCOPY (EGD) WITH PROPOFOL N/A 03/02/2017   Procedure: ESOPHAGOGASTRODUODENOSCOPY (EGD) WITH PROPOFOL;  Surgeon: Alphonsa Overall, MD;  Location: [facility];  Service: General;  Laterality: N/A;  . ESOPHAGOGASTRODUODENOSCOPY (EGD) WITH PROPOFOL N/A 05/20/2017   Procedure: ESOPHAGOGASTRODUODENOSCOPY (EGD) WITH PROPOFOL ERAS PATHWAY;  Surgeon: Alphonsa Overall, MD;  Location: [facility];  Service: General;  Laterality: N/A;  . ESOPHAGOGASTRODUODENOSCOPY (EGD) WITH PROPOFOL N/A 10/07/2017   Procedure: ESOPHAGOGASTRODUODENOSCOPY (EGD) WITH PROPOFOL;  Surgeon: Alphonsa Overall, MD;  Location: [facility];  Service: General;  Laterality: N/A;  . EXCISION OF SKIN TAG  06/08/2017   Procedure: EXCISION OF VULVAR SKIN TAGS X2;  Surgeon: [name], MD;  Location: [facility];  Service: Gynecology;;  . FOOT SURGERY Bilateral    x 2  . GASTRIC ROUX-EN-Y N/A 12/14/2016   Procedure: LAPAROSCOPIC REVISION SLEEVE GASTRECTOMY TO  ROUX-Y-GASTRIC BY-PASS, UPPER ENDO;  Surgeon: Excell Seltzer, MD;  Location: [facility];  Service: General;  Laterality: N/A;  . GASTROJEJUNOSTOMY N/A 05/11/2016   Procedure: LAPAROSCOPIC PLACEMENT  OF FEEDING  JEJUNOSTOMY TUBE;  Surgeon: Excell Seltzer, MD;  Location: [facility];  Service: General;  Laterality: N/A;  . HEMORRHOIDECTOMY WITH HEMORRHOID BANDING    . IR FLUORO GUIDE CV LINE RIGHT  04/06/2017  . IR GENERIC HISTORICAL  05/19/2016   IR CM INJ ANY COLONIC TUBE W/FLUORO 05/19/2016 Aletta Edouard, MD [facility]  . IR PATIENT EVAL TECH 0-60 MINS  11/30/2017  . IR REPLC DUODEN/JEJUNO TUBE PERCUT W/FLUORO  03/14/2018  . IR REPLC DUODEN/JEJUNO TUBE PERCUT W/FLUORO  03/15/2018    . IR US GUIDE VASC ACCESS RIGHT  04/06/2017  . j tube removed march 2018    . KNEE ARTHROSCOPY Left 09/06/2014  . LAPAROSCOPIC GASTRIC SLEEVE RESECTION N/A 01/14/2016   Procedure: LAPAROSCOPIC GASTRIC SLEEVE RESECTION;  Surgeon: Excell Seltzer, MD;  Location: [facility];  Service: General;  Laterality: N/A;  . LAPAROSCOPIC REVISION OF GASTROJEJUNOSTOMY N/A 10/25/2017   Procedure: LAPAROSCOPIC REVISION OF GASTROJEJUNOSTOMY AND PARTIAL GASTRECTOMY, WITH PLACEMENT OF FEEDING GASTROSTOMY TUBE;  Surgeon: Excell Seltzer, MD;  Location: [facility];  Service: General;  Laterality: N/A;  . LAPAROSCOPIC TUBAL LIGATION  10/16/2011   Procedure: LAPAROSCOPIC TUBAL LIGATION;  Surgeon: Alwyn Pea, MD;  Location: [facility];  Service: Gynecology;  Laterality: Bilateral;  . NOVASURE ABLATION  09/28/2010   mild persistent vaginal bleeding  . right knee arthroscopy     05/07/2016  . TOTAL KNEE ARTHROPLASTY Left 03/10/2018   Procedure: LEFT TOTAL KNEE ARTHROPLASTY;  Surgeon: Paralee Cancel, MD;  Location: [facility];  Service: Orthopedics;  Laterality: Left;  70 mins  . TUBAL LIGATION    . VAGINAL HYSTERECTOMY Bilateral 06/08/2017   Procedure: HYSTERECTOMY VAGINAL W/ BILATERAL SALPINGECTOMY;  Surgeon: [name], MD;  Location: [name];  Service: Gynecology;  Laterality: Bilateral;  . wisdom teeth extracted      Family History  Problem Relation Age of Onset  . Hypertension Mother   . Diabetes Mother   . Sarcoidosis Mother        lungs and skin  . Asthma Mother   . Hypertension Father   . Diabetes Father   . Asthma Son   . Tics Son   . Asthma Sister   . Cancer Sister        possible pancreatic cancer  . Adrenal disorder Sister        Tumor   . Asthma Brother   . Asthma Daughter        died age 38.5  . Cancer Daughter 4       brain; died age 38.5  . Asthma Son   . Cancer Paternal Aunt        brain, colon, lung and esophagus; unsure of primary   . Breast cancer Paternal Aunt     Social history:   reports that she quit smoking about 4 years ago. Her smoking use included cigarettes. She has a 10.00 pack-year smoking history. She has never used smokeless tobacco. She reports that she does not drink alcohol or use drugs.  Medications:  Prior to Admission medications   Medication Sig Start Date End Date Taking? Authorizing Provider  albuterol (PROVENTIL HFA;VENTOLIN HFA) 108 (90 Base) MCG/ACT inhaler Inhale 2 puffs into the lungs every 6 (six) hours as needed for wheezing or shortness of breath. For shortness of breath 07/20/18  Yes Stallings, Zoe A, MD  CALCIUM CITRATE PO 10 mLs by Feeding Tube route every other day. In the afternoon.   Yes [provider]  docusate sodium (COLACE) 100 MG capsule Take 1 capsule (100 mg total) by mouth 2 (two) times daily. 03/11/18  Yes Babish, Rodman Key, PA-C  ferrous sulfate (FERROUSUL) 325 (65 FE) MG tablet Take 1 tablet (325 mg total) by mouth daily with breakfast. 05/24/18  Yes Rutherford Guys, MD  fluconazole (DIFLUCAN) 150 MG tablet Take 1 tablet (150 mg total) by mouth daily. Repeat in 24 hours if needed 08/11/18  Yes [name], MD  FLUoxetine (PROZAC) 20 MG/5ML solution Place 10 mLs (40 mg total) into feeding tube daily. 05/24/18  Yes Rutherford Guys, MD  gabapentin (NEURONTIN) 300 MG capsule Take 1 capsule (300 mg total) by mouth 2 (two) times daily AND 2 capsules (600 mg total) at bedtime. 05/24/18  Yes Rutherford Guys, MD  HYDROcodone-acetaminophen [facility]) 10-325 MG tablet Take 1 tablet by mouth every 6 (six) hours as needed for severe pain. 07/18/18  Yes Rutherford Guys, MD  hydrocortisone (ANUSOL-HC) 2.5 % rectal cream Place 1 application rectally 2 (two) times daily. 07/29/18  Yes Rutherford Guys, MD  linaclotide [facility]) 290 MCG CAPS capsule Take 1 capsule (290 mcg total) by mouth daily before breakfast. 02/28/18  Yes Wardell Honour, MD  methocarbamol (ROBAXIN) 500 MG tablet Take 1 tablet (500 mg total) by mouth  every 6 (six) hours as  needed for muscle spasms. 03/15/18  Yes Babish, Rodman Key, PA-C  metoCLOPramide (REGLAN) 10 MG tablet Take 1 tablet (10 mg total) by mouth 3 (three) times daily before meals. 07/20/18  Yes Gatha Mayer, MD  Multiple Vitamin (MULTIVITAMIN) LIQD Place 10 mLs into feeding tube daily. Bariatric Liquid Multivitamin    Yes [provider]  Nutritional Supplements (FEEDING SUPPLEMENT, OSMOLITE 1.2 CAL,) LIQD Place 1,000 mLs into feeding tube continuous.   Yes [provider]  ondansetron (ZOFRAN-ODT) 8 MG disintegrating tablet Take 1 tablet (8 mg total) by mouth every 8 (eight) hours as needed for nausea. 07/18/18  Yes Rutherford Guys, MD  pantoprazole (PROTONIX) 40 MG tablet Take 1 tablet (40 mg total) by mouth 2 (two) times daily before a meal. 12/15/17  Yes Wardell Honour, MD  polyethylene glycol (MIRALAX / GLYCOLAX) packet Take 17 g by mouth 2 (two) times daily. 03/11/18  Yes Babish, Rodman Key, PA-C  thiamine (VITAMIN B-1) 100 MG tablet Take 100 mg by mouth daily at 12 noon.    Yes [provider]  Vitamin D, Ergocalciferol, (DRISDOL) 50000 units CAPS capsule Take 1 capsule (50,000 Units total) by mouth every 7 (seven) days. 07/18/18  Yes Rutherford Guys, MD      Allergies  Allergen Reactions  . Coconut Oil Anaphylaxis and Itching  . Morphine And Related Anaphylaxis and Hives    Pt states that she has tolerated Norco and Dilaudid  . Oxycodone Anaphylaxis  . Ciprofloxacin Itching and Rash  . Doxycycline Nausea And Vomiting  . Adhesive [Tape] Rash    And paper tape causes a rash if wearing for a prolong period of time  . Penicillins Rash    Has patient had a PCN reaction causing immediate rash, facial/tongue/throat swelling, SOB or lightheadedness with hypotension: Yes Has patient had a PCN reaction causing severe rash involving mucus membranes or skin necrosis: No Has patient had a PCN reaction that required hospitalization No Has patient had a PCN reaction occurring  within the last 10 years: No If all of the above answers are "NO", then may proceed with Cephalosporin use.    ROS:  Out of a complete 14 system review of symptoms, the patient complains only of the following symptoms, and all other reviewed systems are negative.  Decreased activity, decreased appetite, chills, fatigue Ringing in the ears Light sensitivity, loss of vision, eye pain Wheezing, shortness of breath Chest pain, leg swelling Rectal bleeding, nausea, vomiting Restless legs, frequent waking, daytime sleepiness Food allergies Joint pain, joint swelling, back pain, neck pain Moles, itching Bruising easily, anemia Memory loss, dizziness, headache, numbness, weakness, tremors Agitation, depression  Blood pressure 113/76, pulse 74, resp. rate 14, height 5' 6.5" (1.689 m), weight 193 lb (87.5 kg), last menstrual period 06/02/2017.  Physical Exam  General: The patient is alert and cooperative at the time of the examination.  The patient is mildly obese.  Eyes: Pupils are equal, round, and reactive to light. Discs are flat bilaterally.  Neck: The neck is supple, no carotid bruits are noted.  Respiratory: The respiratory examination is clear.  Cardiovascular: The cardiovascular examination reveals a regular rate and rhythm, no obvious murmurs or rubs are noted.  Neuromuscular: Range move the cervical spine and lumbar spine or relatively full.  Skin: Extremities are without significant edema.  Neurologic Exam  Mental status: The patient is alert and oriented x 3 at the time of the examination. The patient has apparent normal  recent and remote memory, with an apparently normal attention span and concentration ability.  Cranial nerves: Facial symmetry is present. There is good sensation of the face to pinprick and soft touch bilaterally. The strength of the facial muscles and the muscles to head turning and shoulder shrug are normal bilaterally. Speech is well enunciated, no  aphasia or dysarthria is noted. Extraocular movements are full. Visual fields are full. The tongue is midline, and the patient has symmetric elevation of the soft palate. No obvious hearing deficits are noted.  Motor: The motor testing reveals 5 over 5 strength of all 4 extremities. Good symmetric motor tone is noted throughout.  Sensory: Sensory testing is intact to pinprick, soft touch, vibration sensation, and position sense on all 4 extremities. No evidence of extinction is noted.  Coordination: Cerebellar testing reveals good finger-nose-finger and heel-to-shin bilaterally.  Gait and station: Gait is normal. Tandem gait is normal. Romberg is negative. No drift is seen.  Reflexes: Deep tendon reflexes are symmetric and normal bilaterally, but there are 2-3 beats of ankle clonus on the right. Toes are downgoing bilaterally.   Assessment/Plan:  1.  Chronic daily headache  2.  Chronic neuromuscular discomfort, neck, shoulders, low back  3.  Reported tremor, primarily right upper extremity  Minimal tremors are noted on the clinical examination today, the patient has what looks like a mild resting tremor involving the right arm.  The patient is on metoclopramide which could produce parkinsonian features, but the patient indicates that the tremor started before she began the medication.  The patient is also reporting some paresthesias of the right arm, we may consider EMG nerve conduction studies on the arms in the future.  The patient will be placed on nortriptyline for the insomnia, headache, and diffuse neuromuscular discomfort.  The patient will follow-up in 4 months.  Jill Alexanders MD 08/31/2018 8:30 AM  [facility] [address] [place], [place] [address]  Phone [phone] Fax [phone]

## 2018-08-31 NOTE — Patient Instructions (Signed)
We will start nrotriptyline at night for the headache.  Pamelor (nortriptyline) is an antidepressant medication that has many uses that may include headache, whiplash injuries, or for peripheral neuropathy pain. Side effects may include drowsiness, dry mouth, blurred vision, or constipation. As with any antidepressant medication, worsening depression may occur. If you had any significant side effects, please call our office. The full effects of this medication may take 7-10 days after starting the drug, or going up on the dose.

## 2018-09-05 DIAGNOSIS — G5601 Carpal tunnel syndrome, right upper limb: Secondary | ICD-10-CM | POA: Diagnosis not present

## 2018-09-05 DIAGNOSIS — M25511 Pain in right shoulder: Secondary | ICD-10-CM | POA: Diagnosis not present

## 2018-09-05 DIAGNOSIS — M7581 Other shoulder lesions, right shoulder: Secondary | ICD-10-CM | POA: Diagnosis not present

## 2018-09-05 DIAGNOSIS — G894 Chronic pain syndrome: Secondary | ICD-10-CM | POA: Diagnosis not present

## 2018-09-06 DIAGNOSIS — M25511 Pain in right shoulder: Secondary | ICD-10-CM | POA: Diagnosis not present

## 2018-09-08 ENCOUNTER — Encounter: Payer: Self-pay | Admitting: Neurology

## 2018-09-09 ENCOUNTER — Other Ambulatory Visit: Payer: Self-pay | Admitting: *Deleted

## 2018-09-09 DIAGNOSIS — R200 Anesthesia of skin: Secondary | ICD-10-CM

## 2018-09-09 DIAGNOSIS — R202 Paresthesia of skin: Secondary | ICD-10-CM

## 2018-09-13 DIAGNOSIS — M25511 Pain in right shoulder: Secondary | ICD-10-CM | POA: Diagnosis not present

## 2018-09-16 DIAGNOSIS — M542 Cervicalgia: Secondary | ICD-10-CM | POA: Diagnosis not present

## 2018-09-16 DIAGNOSIS — M545 Low back pain: Secondary | ICD-10-CM | POA: Diagnosis not present

## 2018-09-22 ENCOUNTER — Ambulatory Visit (INDEPENDENT_AMBULATORY_CARE_PROVIDER_SITE_OTHER): Payer: 59 | Admitting: Neurology

## 2018-09-22 DIAGNOSIS — G5621 Lesion of ulnar nerve, right upper limb: Secondary | ICD-10-CM

## 2018-09-22 DIAGNOSIS — R200 Anesthesia of skin: Secondary | ICD-10-CM | POA: Diagnosis not present

## 2018-09-22 DIAGNOSIS — G5601 Carpal tunnel syndrome, right upper limb: Secondary | ICD-10-CM

## 2018-09-22 DIAGNOSIS — M25511 Pain in right shoulder: Secondary | ICD-10-CM | POA: Diagnosis not present

## 2018-09-22 DIAGNOSIS — R202 Paresthesia of skin: Secondary | ICD-10-CM

## 2018-09-22 NOTE — Procedures (Signed)
[facility]  [place], [place]  [place],  [address] Tel: [phone] Fax:  [phone] Test Date:  09/22/2018  Patient: [name] DOB: Mar 29, 1976 Physician: Narda Amber, DO  Sex: Female Height: 5' 6"  Ref Phys: Berle Mull, MD  ID#: [id number] Temp: 35.0C Technician:    Patient Complaints: This is a 42 year old female referred for evaluation of right wrist pain and hand numbness and tingling.  NCV & EMG Findings: Extensive electrodiagnostic testing of the right upper extremity shows:  1. Right median sensory response shows prolonged distal peak latency (4.2 ms).  Right ulnar sensory responses within normal limits. 2. Right median motor responses within normal limits.  Right ulnar motor response shows decreased conduction velocity across the elbow (A Elbow-B Elbow, 42 m/s).   3. Chronic motor axonal loss changes are isolated to the ulnar innervated muscles, without accompanied active denervation.    Impression: 1. Right ulnar neuropathy with slowing across the elbow, purely demyelinating type and mild in degree electrically. 2. Right median neuropathy at or distal to the wrist, consistent with a clinical diagnosis of carpal tunnel syndrome.  Overall, these findings are mild in degree electrically.   ___________________________ Narda Amber, DO    Nerve Conduction Studies Anti Sensory Summary Table   Site NR Peak (ms) Norm Peak (ms) P-T Amp (V) Norm P-T Amp  Right Median Anti Sensory (2nd Digit)  35C  Wrist    4.2 <3.4 23.3 >20  Right Ulnar Anti Sensory (5th Digit)  35C  Wrist    3.0 <3.1 23.5 >12   Motor Summary Table   Site NR Onset (ms) Norm Onset (ms) O-P Amp (mV) Norm O-P Amp Site1 Site2 Delta-0 (ms) Dist (cm) Vel (m/s) Norm Vel (m/s)  Right Median Motor (Abd Poll Brev)  35C  Wrist    3.9 <3.9 8.8 >6 Elbow Wrist 5.4 31.0 57 >50  Elbow    9.3  8.6         Right Ulnar Motor (Abd Dig Minimi)  35C  Wrist    2.7 <3.1 9.2 >7 B Elbow  Wrist 4.7 24.0 51 >50  B Elbow    7.4  9.0  A Elbow B Elbow 2.4 10.0 42 >50  A Elbow    9.8  8.8          EMG   Side Muscle Ins Act Fibs Psw Fasc Number Recrt Dur Dur. Amp Amp. Poly Poly. Comment  Right PronatorTeres Nml Nml Nml Nml Nml Nml Nml Nml Nml Nml Nml Nml N/A  Right Abd Poll Brev Nml Nml Nml Nml Nml Nml Nml Nml Nml Nml Nml Nml N/A  Right Triceps Nml Nml Nml Nml Nml Nml Nml Nml Nml Nml Nml Nml N/A  Right Biceps Nml Nml Nml Nml Nml Nml Nml Nml Nml Nml Nml Nml N/A  Right Deltoid Nml Nml Nml Nml Nml Nml Nml Nml Nml Nml Nml Nml N/A  Right ABD Dig Min Nml Nml Nml Nml 1- Rapid Some 1+ Some 1+ Nml Nml N/A  Right FlexCarpiUln Nml Nml Nml Nml 1- Rapid Few 1+ Few 1+ Nml Nml N/A  Right 1stDorInt Nml Nml Nml Nml 1- Rapid Some 1+ Some 1+ Some 1+ N/A      Waveforms:

## 2018-09-26 ENCOUNTER — Other Ambulatory Visit: Payer: Self-pay | Admitting: Neurology

## 2018-09-26 NOTE — Telephone Encounter (Signed)
Pharm requested 90 day rx.

## 2018-09-27 ENCOUNTER — Ambulatory Visit
Admission: RE | Admit: 2018-09-27 | Discharge: 2018-09-27 | Disposition: A | Discharge status: Home / Self Care | Payer: 59 | Source: Ambulatory Visit | Attending: Family Medicine | Admitting: Family Medicine

## 2018-09-27 DIAGNOSIS — M25519 Pain in unspecified shoulder: Secondary | ICD-10-CM | POA: Diagnosis not present

## 2018-09-27 DIAGNOSIS — Z01419 Encounter for gynecological examination (general) (routine) without abnormal findings: Secondary | ICD-10-CM

## 2018-09-27 DIAGNOSIS — Z1231 Encounter for screening mammogram for malignant neoplasm of breast: Secondary | ICD-10-CM | POA: Diagnosis not present

## 2018-10-03 DIAGNOSIS — M5416 Radiculopathy, lumbar region: Secondary | ICD-10-CM | POA: Diagnosis not present

## 2018-10-03 DIAGNOSIS — M542 Cervicalgia: Secondary | ICD-10-CM | POA: Diagnosis not present

## 2018-10-03 DIAGNOSIS — M5412 Radiculopathy, cervical region: Secondary | ICD-10-CM | POA: Diagnosis not present

## 2018-10-04 DIAGNOSIS — M542 Cervicalgia: Secondary | ICD-10-CM | POA: Diagnosis not present

## 2018-10-04 DIAGNOSIS — M25519 Pain in unspecified shoulder: Secondary | ICD-10-CM | POA: Diagnosis not present

## 2018-10-18 DIAGNOSIS — M25511 Pain in right shoulder: Secondary | ICD-10-CM | POA: Diagnosis not present

## 2018-10-18 DIAGNOSIS — M542 Cervicalgia: Secondary | ICD-10-CM | POA: Diagnosis not present

## 2018-10-18 IMAGING — CT CT HEAD W/O CM
3 series · 16 of 47 positions shown, 19 images · non-contrast
Comparison: March 12, 2015

CLINICAL DATA: Lightheadedness and dizziness for 5 days. Patient
status post hysterectomy 5 days ago.

EXAM:
CT HEAD WITHOUT CONTRAST
TECHNIQUE: Contiguous axial images were obtained from the base of the skull
through the vertex without intravenous contrast.

[Series 3: head 5.0 h30s · axial · 0.44mm/px · z∈[-144,-14]mm · 10 of 32 slices shown, 13 images]
[im 3/32  brain]
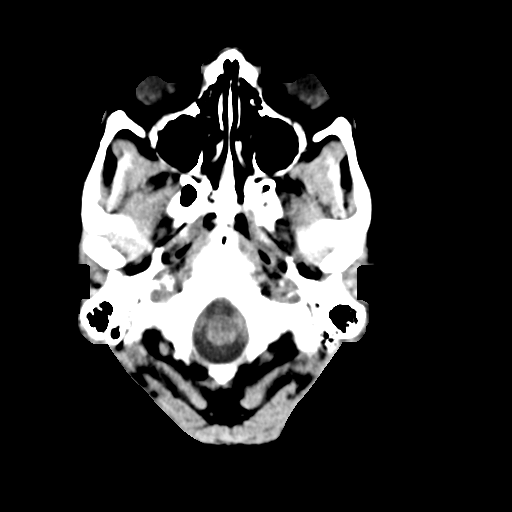
[im 3/32  bone]
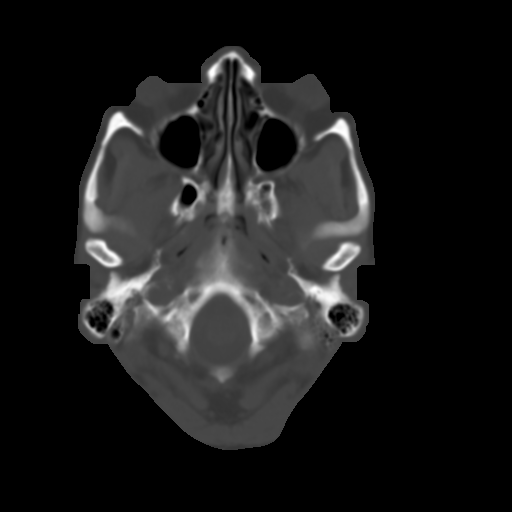
[im 6/32  brain]
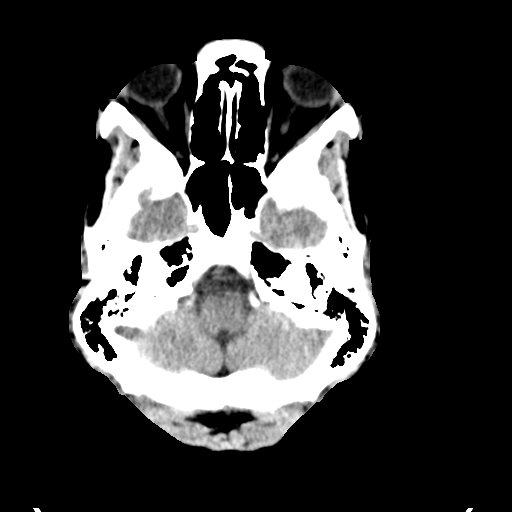
[im 9/32  brain]
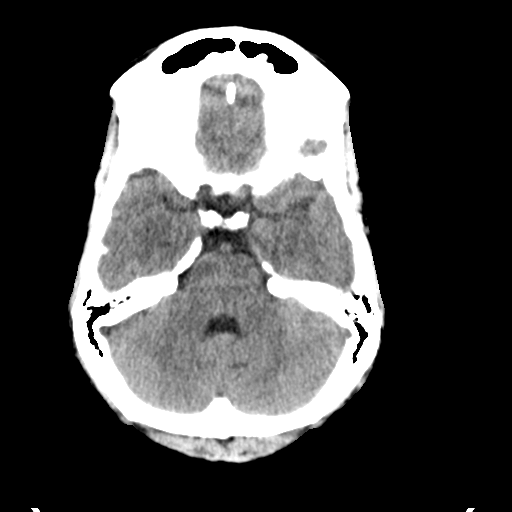
[im 11/32  brain]
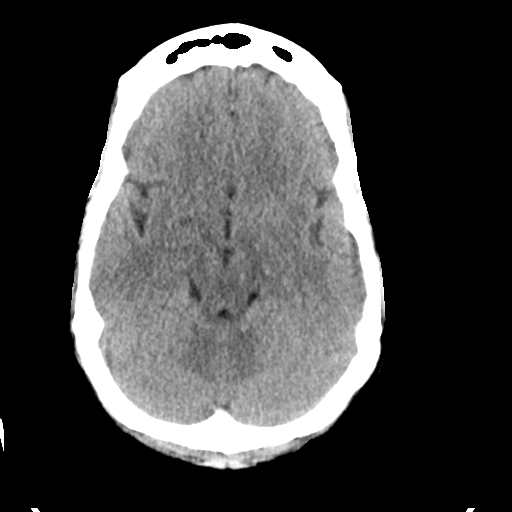
[im 14/32  brain]
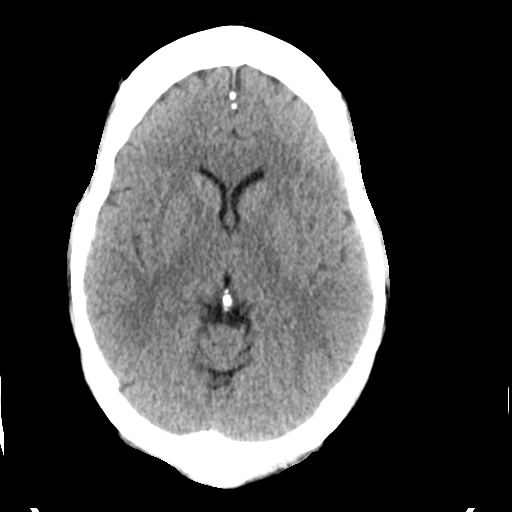
[im 14/32  bone]
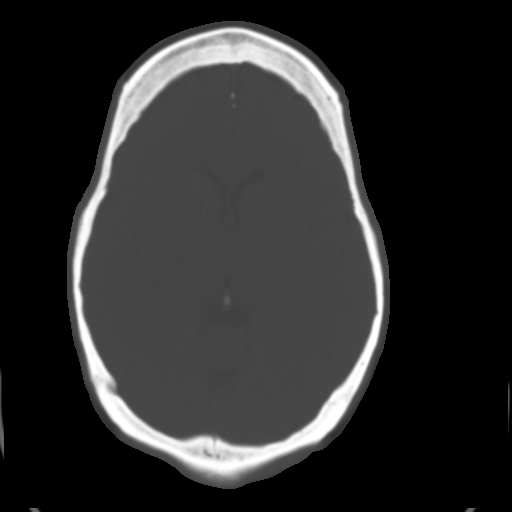
[im 18/32  brain]
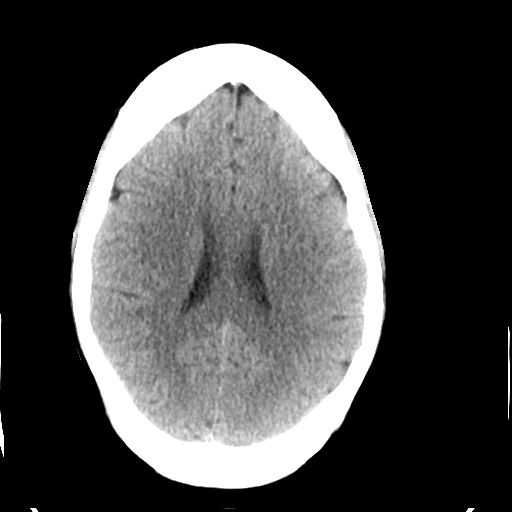
[im 21/32  brain]
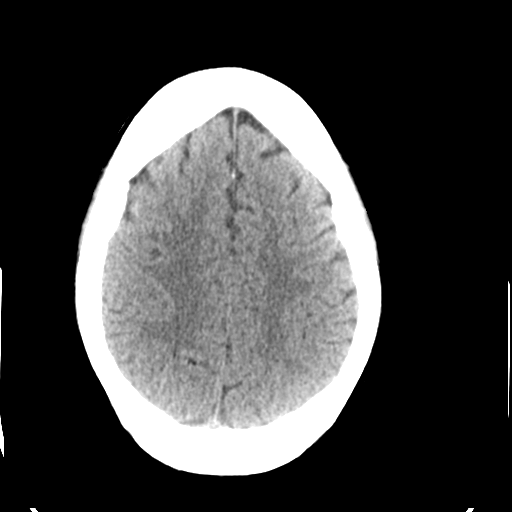
[im 24/32  brain]
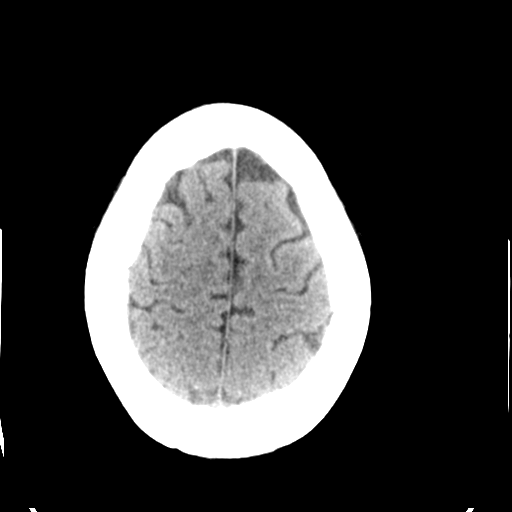
[im 26/32  brain]
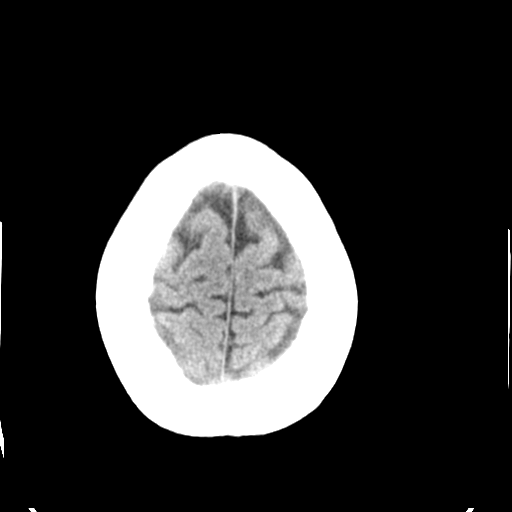
[im 26/32  bone]
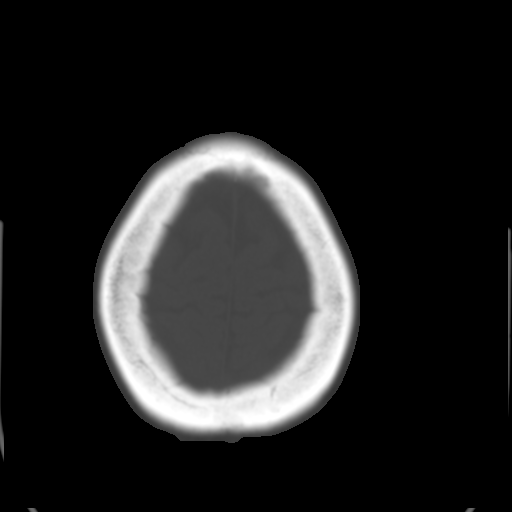
[im 29/32  brain]
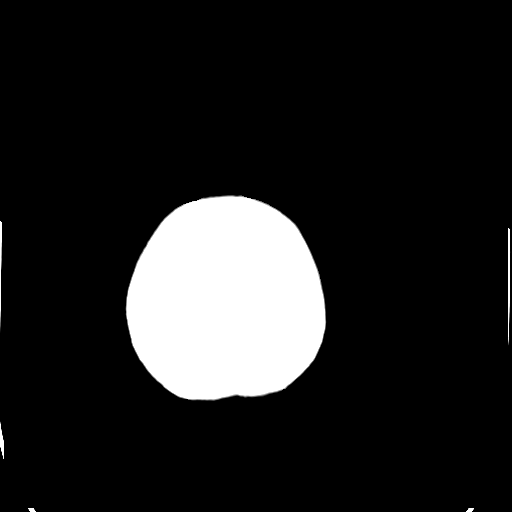

[Series 5: head 3.0 mpr cor · coronal · 0.32mm/px · 3 of 74 slices shown]
[im 25/74  brain]
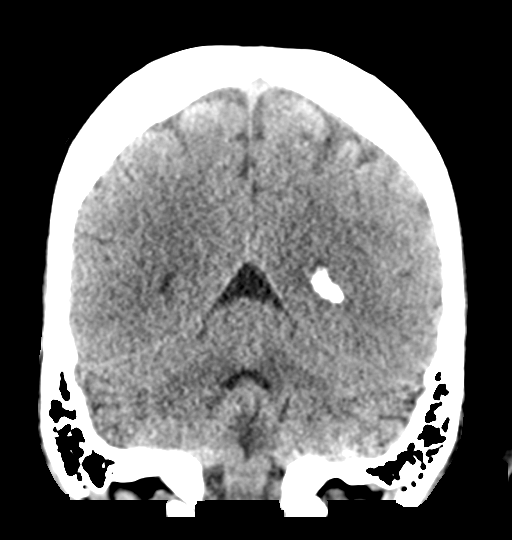
[im 33/74  brain]
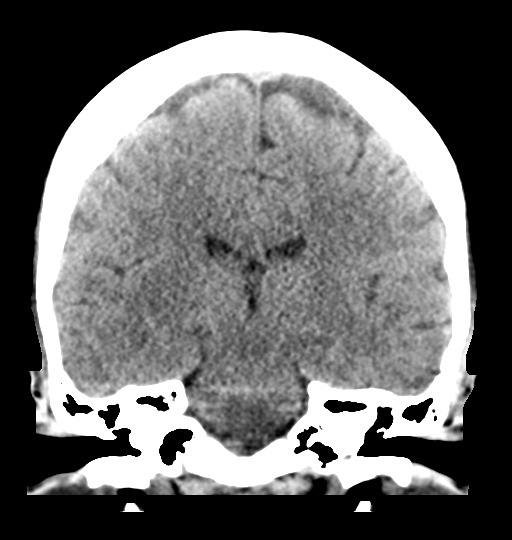
[im 41/74  brain]
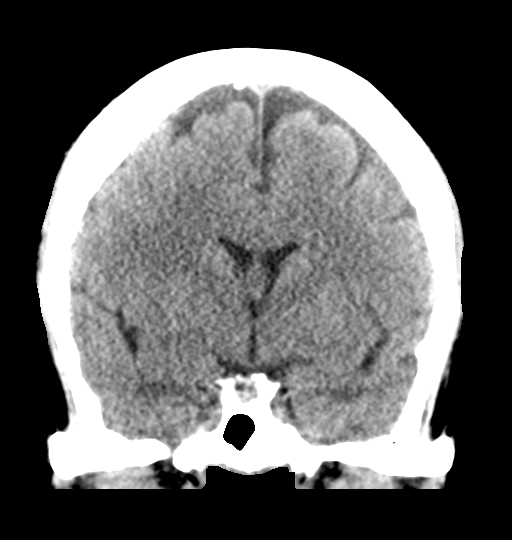

[Series 6: head 3.0 mpr sag · sagittal · 0.33mm/px · 3 of 57 slices shown]
[im 19/57  brain]
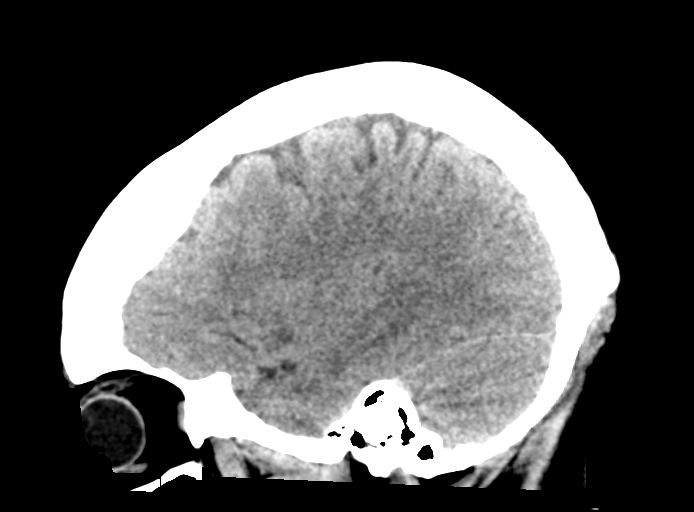
[im 29/57  brain]
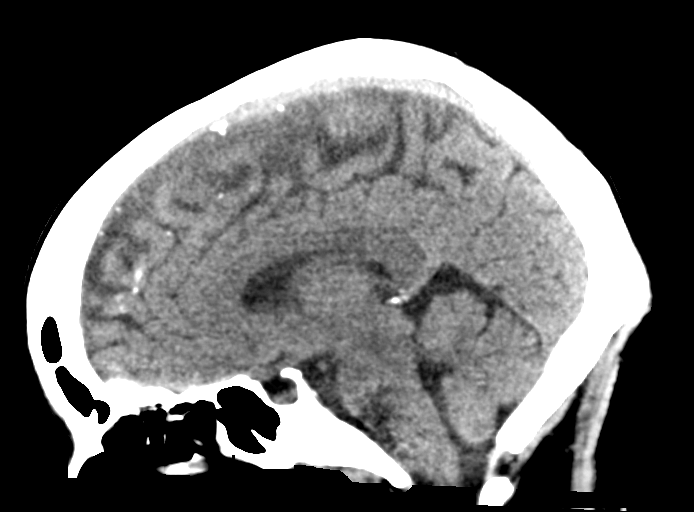
[im 38/57  brain]
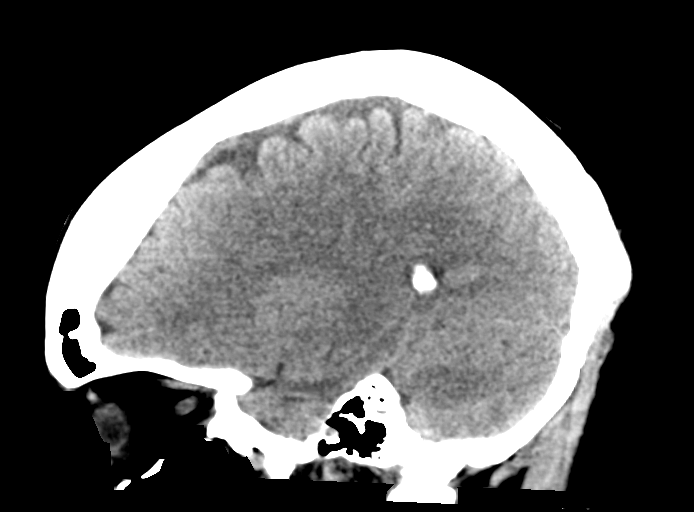

[16 of 47 positions shown; findings below may reference images not displayed]

FINDINGS: Brain: No evidence of acute infarction, hemorrhage, hydrocephalus,
extra-axial collection or mass lesion/mass effect.

Vascular: No hyperdense vessel or unexpected calcification.

Skull: Normal. Negative for fracture or focal lesion.

Sinuses/Orbits: No acute finding.

Other: None.
IMPRESSION: No focal acute intracranial abnormality identified.

## 2018-10-25 DIAGNOSIS — M5136 Other intervertebral disc degeneration, lumbar region: Secondary | ICD-10-CM | POA: Diagnosis not present

## 2018-10-25 DIAGNOSIS — M5033 Other cervical disc degeneration, cervicothoracic region: Secondary | ICD-10-CM | POA: Diagnosis not present

## 2018-10-31 DIAGNOSIS — G894 Chronic pain syndrome: Secondary | ICD-10-CM | POA: Diagnosis not present

## 2018-10-31 DIAGNOSIS — M25562 Pain in left knee: Secondary | ICD-10-CM | POA: Diagnosis not present

## 2018-10-31 DIAGNOSIS — E539 Vitamin B deficiency, unspecified: Secondary | ICD-10-CM | POA: Diagnosis not present

## 2018-11-10 DIAGNOSIS — Z9884 Bariatric surgery status: Secondary | ICD-10-CM | POA: Diagnosis not present

## 2018-11-14 DIAGNOSIS — E569 Vitamin deficiency, unspecified: Secondary | ICD-10-CM | POA: Diagnosis not present

## 2018-11-29 ENCOUNTER — Ambulatory Visit (INDEPENDENT_AMBULATORY_CARE_PROVIDER_SITE_OTHER): Payer: 59

## 2018-11-29 VITALS — BP 115/78 | HR 72 | Wt 185.0 lb

## 2018-11-29 DIAGNOSIS — R319 Hematuria, unspecified: Secondary | ICD-10-CM

## 2018-11-29 DIAGNOSIS — G894 Chronic pain syndrome: Secondary | ICD-10-CM | POA: Diagnosis not present

## 2018-11-29 DIAGNOSIS — N390 Urinary tract infection, site not specified: Secondary | ICD-10-CM

## 2018-11-29 MED ORDER — NITROFURANTOIN MONOHYD MACRO 100 MG PO CAPS: 100.0000 mg | ORAL_CAPSULE | Freq: Two times a day (BID) | ORAL | 0 refills | Status: DC

## 2018-11-29 NOTE — Addendum Note (Signed)
Addended by: Phillip Heal, DEMETRICE A on: 11/29/2018 06:50 PM   Modules accepted: Orders

## 2018-11-29 NOTE — Progress Notes (Signed)
SUBJECTIVE: [name] is a 43 y.o. female who complains of urinary frequency, urgency and dysuria x three days, with some flank pain, no fever, no chills, or abnormal vaginal discharge or bleeding.   OBJECTIVE: Appears well, in no apparent distress. Vital signs are normal. 115/78 Urine dipstick shows small amount of blood.    ASSESSMENT: Dysuria  PLAN: Treatment per orders.  Call or return to clinic prn if these symptoms worsen or fail to improve as anticipated. Will send urine for culture and send in macrobid for patient.Patient is aware we may have to call her back to change her abx once we get her results back.

## 2018-11-29 NOTE — Addendum Note (Signed)
Addended by: Phillip Heal, Cidney Kirkwood A on: 11/29/2018 06:48 PM   Modules accepted: Orders

## 2018-11-30 LAB — POCT URINALYSIS DIP (DEVICE)
Bilirubin Urine: NEGATIVE
Glucose, UA: 500 mg/dL — AB
Ketones, ur: NEGATIVE mg/dL
Nitrite: NEGATIVE
PROTEIN: NEGATIVE mg/dL
Specific Gravity, Urine: 1.02 (ref 1.005–1.030)
Urobilinogen, UA: 0.2 mg/dL (ref 0.0–1.0)
pH: 5 (ref 5.0–8.0)

## 2018-12-01 DIAGNOSIS — M25511 Pain in right shoulder: Secondary | ICD-10-CM | POA: Diagnosis not present

## 2018-12-02 LAB — URINE CULTURE

## 2018-12-07 ENCOUNTER — Other Ambulatory Visit: Payer: Self-pay | Admitting: Family Medicine

## 2018-12-07 ENCOUNTER — Other Ambulatory Visit: Payer: Self-pay | Admitting: Internal Medicine

## 2018-12-07 NOTE — Telephone Encounter (Signed)
Please advise [name], seen October 2019.

## 2018-12-07 NOTE — Telephone Encounter (Signed)
Requested medication (s) are due for refill today - if going to continue- may be short term  Requested medication (s) are on the active medication list -yes  Future visit scheduled -no  Last refill: 07/18/18  Notes to clinic: Patient is requesting refill of non delegated Rx- sent for PCP review   Requested Prescriptions  Pending Prescriptions Disp Refills   Vitamin D, Ergocalciferol, (DRISDOL) 1.25 MG (50000 UT) CAPS capsule [Pharmacy Med Name: VITAMIN D2 1.25MG(50,000 UNIT)] 12 capsule 0    Sig: TAKE 1 CAPSULE BY MOUTH EVERY 7 DAYS     Endocrinology:  Vitamins - Vitamin D Supplementation Failed - 12/07/2018  9:30 AM      Failed - 50,000 IU strengths are not delegated      Failed - Phosphate in normal range and within 360 days    Phosphorus  Date Value Ref Range Status  05/15/2016 3.8 2.5 - 4.6 mg/dL Final         Failed - Vitamin D in normal range and within 360 days    Vit D, 1,25-Dihydroxy  Date Value Ref Range Status  05/19/2017 50.5 19.9 - 79.3 pg/mL Final   Vit D, 25-Hydroxy  Date Value Ref Range Status  07/18/2018 14.9 (L) 30.0 - 100.0 ng/mL Final    Comment:    Vitamin D deficiency has been defined by the Institute of Medicine and an Endocrine Society practice guideline as a level of serum 25-OH vitamin D less than 20 ng/mL (1,2). The Endocrine Society went on to further define vitamin D insufficiency as a level between 21 and 29 ng/mL (2). 1. IOM (Institute of Medicine). 2010. Dietary reference    intakes for calcium and D. [place]: The    [employer]. 2. Holick MF, Binkley Clif[place], Bischoff-Ferrari HA, et al.    Evaluation, treatment, and prevention of vitamin D    deficiency: an Endocrine Society clinical practice    guideline. JCEM. 2011 Jul; 96(7):1911-30.          Passed - Ca in normal range and within 360 days    Calcium  Date Value Ref Range Status  04/22/2018 9.6 8.7 - 10.2 mg/dL Final         Passed - Valid encounter within last 12  months    Recent Outpatient Visits          4 months ago Iron deficiency anemia secondary to inadequate dietary iron intake   [facility], Lilia Argue, MD   4 months ago B12 deficiency   [facility], Ranell Patrick, MD   6 months ago Iron deficiency anemia secondary to inadequate dietary iron intake   [facility], Lilia Argue, MD   7 months ago B12 deficiency   [facility], Renette Butters, MD   8 months ago Iron deficiency anemia secondary to inadequate dietary iron intake   [facility], Renette Butters, MD              Requested Prescriptions  Pending Prescriptions Disp Refills   Vitamin D, Ergocalciferol, (DRISDOL) 1.25 MG (50000 UT) CAPS capsule [Pharmacy Med Name: VITAMIN D2 1.25MG(50,000 UNIT)] 12 capsule 0    Sig: TAKE 1 CAPSULE BY MOUTH EVERY 7 DAYS     Endocrinology:  Vitamins - Vitamin D Supplementation Failed - 12/07/2018  9:30 AM      Failed - 50,000 IU strengths are not delegated      Failed -  Phosphate in normal range and within 360 days    Phosphorus  Date Value Ref Range Status  05/15/2016 3.8 2.5 - 4.6 mg/dL Final         Failed - Vitamin D in normal range and within 360 days    Vit D, 1,25-Dihydroxy  Date Value Ref Range Status  05/19/2017 50.5 19.9 - 79.3 pg/mL Final   Vit D, 25-Hydroxy  Date Value Ref Range Status  07/18/2018 14.9 (L) 30.0 - 100.0 ng/mL Final    Comment:    Vitamin D deficiency has been defined by the [place] practice guideline as a level of serum 25-OH vitamin D less than 20 ng/mL (1,2). The Endocrine Society went on to further define vitamin D insufficiency as a level between 21 and 29 ng/mL (2). 1. IOM (Institute of Medicine). 2010. Dietary reference    intakes for calcium and D. [place]: The    [employer]. 2. Holick MF, [place], Bischoff-Ferrari HA, et al.    Evaluation, treatment, and prevention of  vitamin D    deficiency: an Endocrine Society clinical practice    guideline. JCEM. 2011 Jul; 96(7):1911-30.          Passed - Ca in normal range and within 360 days    Calcium  Date Value Ref Range Status  04/22/2018 9.6 8.7 - 10.2 mg/dL Final         Passed - Valid encounter within last 12 months    Recent Outpatient Visits          4 months ago Iron deficiency anemia secondary to inadequate dietary iron intake   [facility], Lilia Argue, MD   4 months ago B12 deficiency   [facility], Ranell Patrick, MD   6 months ago Iron deficiency anemia secondary to inadequate dietary iron intake   [facility], Lilia Argue, MD   7 months ago B12 deficiency   [facility], Renette Butters, MD   8 months ago Iron deficiency anemia secondary to inadequate dietary iron intake   [facility] at [facility], Renette Butters, MD

## 2018-12-08 NOTE — Telephone Encounter (Signed)
1 month ok Needs f/u also

## 2018-12-08 NOTE — Telephone Encounter (Signed)
I spoke with [name] and she made an appointment in April. I sent in her refill.

## 2018-12-29 DIAGNOSIS — M2550 Pain in unspecified joint: Secondary | ICD-10-CM | POA: Diagnosis not present

## 2018-12-29 DIAGNOSIS — E569 Vitamin deficiency, unspecified: Secondary | ICD-10-CM | POA: Diagnosis not present

## 2018-12-29 DIAGNOSIS — G894 Chronic pain syndrome: Secondary | ICD-10-CM | POA: Diagnosis not present

## 2019-01-02 ENCOUNTER — Ambulatory Visit (INDEPENDENT_AMBULATORY_CARE_PROVIDER_SITE_OTHER): Payer: 59 | Admitting: Neurology

## 2019-01-02 ENCOUNTER — Encounter: Payer: Self-pay | Admitting: Neurology

## 2019-01-02 ENCOUNTER — Other Ambulatory Visit: Payer: Self-pay

## 2019-01-02 DIAGNOSIS — R202 Paresthesia of skin: Secondary | ICD-10-CM

## 2019-01-02 DIAGNOSIS — R519 Headache, unspecified: Secondary | ICD-10-CM

## 2019-01-02 DIAGNOSIS — R51 Headache: Secondary | ICD-10-CM

## 2019-01-02 MED ORDER — NORTRIPTYLINE HCL 10 MG PO CAPS: ORAL_CAPSULE | ORAL | 3 refills | Status: DC

## 2019-01-02 NOTE — Progress Notes (Signed)
Virtual Visit via Video Note  I connected with [name] on 01/02/19 at  9:15 AM EDT by a video enabled telemedicine application and verified that I am speaking with the correct person using two identifiers.   I discussed the limitations of evaluation and management by telemedicine and the availability of in person appointments. The patient expressed understanding and agreed to proceed.  History of Present Illness: 08/31/2018 Dr.Willis: [name] is a 43 year old right-handed black female with a history of morbid obesity, status post bariatric surgery.  Following surgery, she had a severe weight loss issue, she currently requires a feeding tube for her nutrition.  The patient has come to this office with a lifelong history of headache, over the last several years the headache has become daily in nature.  The headache is in the temporal regions, right equal to left, the pain may be in the eye itself at times.  The patient may have nausea and vomiting and photophobia and phonophobia with the headache.  The patient also has chronic neck and shoulder discomfort, and chronic low back pain.  She takes hydrocodone, 1 tablet 4 times daily, she takes gabapentin without full improvement.  The patient will take Tylenol if needed for the headache without benefit, she denies any significant caffeine intake during the day.  The patient over the last 6 months has noted some tremors involving the arms, right greater than left.  The patient is on Reglan, but she states she has only been on this for several months.  The patient is not sleeping well, she has chronic insomnia.  The patient may have cramping in the legs at night, she has to get up and walk frequently.  Otherwise she reports some more persistent numbness and paresthesias in the right arm, EMG study done in the past shows a mild peripheral neuropathy.  MRI of the brain and cervical spine done 2 years ago were completely normal.  January 02, 2019 SS: Ms.  Lutz is a 43 year old female with history of headache, mild resting tremor in the right arm, paresthesias right arm. She was started on nortriptyline for insomnia, headache, neuromuscular discomfort.  She reports the nortriptyline was beneficial however she ran out of the medication about a month ago.  She reports with the medication she saw improvement in her sleep and her headaches.  She reports that she now has daily headaches, mostly on the right side of her head, retro-orbital, about 1-2 bad headaches per month.  She reports photosensitivity, photosensitivity.  She is already taking 10-325 Norco every 6 hours.  When she gets a bad headache she will take ibuprofen, it does help.  She reports chronic pain syndrome related to neck pain, back pain, sees [facility] orth[facility], Dr. Nelva Bush for pain management.  Since her last visit she reports a right rotator cuff tear.  She continues to complain of pins-and-needles, cramping, swelling in her right wrist, upper arm.  She reports that she is not working, is applying for disability.  She reports intermittent tremors in her right fingertips, worse when she is nervous.  She reports she will play with things in her fingers, like a rubber band, to help with the tremor.  She does not notice any difficulty with her handwriting or eating.  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  She reports she is swallowing pills, no longer requiring liquid medications.  She does report that she has gained 40 pounds back since having  her feeding tube.  She does continue to complain of trouble with her balance, occasional falling, dizziness when standing.  She does complain of numbness, weakness in both legs, right is greater than left.  She feels this problem has gotten worse. This is been going on since her initial weight loss surgery.  She does report that when she stands she will stand slowly.  Apparently she checks her blood pressures and they have been  normal.  Today she is requesting to restart nortriptyline in pill form.  She denies any changes to her medications or medical history.  Observations/Objective: I reviewed her medications, medical history  Alert, speech is clear, concise, responds appropriately, facial symmetry, no arm drift, symmetric shoulder shrug, able to perform finger-to-nose while outstretched, no visible tremors seen to her right fingers, gait appears intact however difficult to visualize  Assessment and Plan: 1.  Migraine headaches 2.  Paresthesias, right arm  I will switch her nortriptyline from liquid to pill form at her request.  She has been off the medication for over a month.  She will restart nortriptyline taking 10 mg at bedtime for 1 week, 20 mg at bedtime for 1 week, 30 mg at bedtime for 1 week.  She will call for any dose adjustments.  I will set her up for EMG of her lower extremities since she feels the paresthesias are getting worse.  She had an EMG in December 2017 on her left upper extremity, both lower extremities.  Showing evidence of what appears to be a mild sensorimotor peripheral neuropathy.  There was no evidence of an overlying lumbosacral radiculopathy.  Apparently after reviewing the records she had a EMG at [facility] in December 2019 ordered by Dr. Alfonso Ramus for evaluation of right wrist pain, hand numbness and tingling.  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 Apparently she has vitamin B12 deficiency, getting injections per Dr. Minna Antis, she had appointment 12/29/2018. I didn't notice a visible tremor, however it is difficult to discern via video visit, will further assess in upcoming office revisit. Sees regular orthopedic doctor, Dr. Veverly Fells tomorrow.    Follow Up Instructions: 4 months for revisit, set up EMG   I discussed the assessment and treatment plan with the  patient. The patient was provided an opportunity to ask questions and all were answered. The patient agreed with the plan and demonstrated an understanding of the instructions.   The patient was advised to call back or seek an in-person evaluation if the symptoms worsen or if the condition fails to improve as anticipated.  I provided 25 minutes of non-face-to-face time during this encounter.  Evangeline Dakin, DNP  [facility] [address], [place], [place] [address] [phone]

## 2019-01-02 NOTE — Progress Notes (Signed)
I have read the note, and I agree with the clinical assessment and plan.  Charles K Willis   

## 2019-01-03 DIAGNOSIS — M25511 Pain in right shoulder: Secondary | ICD-10-CM | POA: Diagnosis not present

## 2019-01-16 ENCOUNTER — Telehealth (INDEPENDENT_AMBULATORY_CARE_PROVIDER_SITE_OTHER): Payer: 59 | Admitting: Family Medicine

## 2019-01-16 ENCOUNTER — Other Ambulatory Visit: Payer: Self-pay

## 2019-01-16 ENCOUNTER — Encounter: Payer: Self-pay | Admitting: Family Medicine

## 2019-01-16 DIAGNOSIS — B370 Candidal stomatitis: Secondary | ICD-10-CM

## 2019-01-16 MED ORDER — LIDOCAINE VISCOUS HCL 2 % MT SOLN: 10.0000 mL | Freq: Four times a day (QID) | OROMUCOSAL | 0 refills | Status: DC | PRN

## 2019-01-16 MED ORDER — ALUM & MAG HYDROXIDE-SIMETH 200-200-20 MG/5ML PO SUSP: 30.0000 mL | Freq: Four times a day (QID) | ORAL | 0 refills | Status: DC | PRN

## 2019-01-16 MED ORDER — NYSTATIN 100000 UNIT/ML MT SUSP: 5.0000 mL | Freq: Four times a day (QID) | OROMUCOSAL | 0 refills | Status: DC

## 2019-01-16 NOTE — Progress Notes (Signed)
Virtual Visit via telephone Note  I connected with patient on 01/16/19 at 126pm by telephone and verified that I am speaking with the correct person using two identifiers. [name] is currently located at home and patient is currently with her during visit. The provider, Rutherford Guys, MD is located in their office at time of visit.  I discussed the limitations, risks, security and privacy concerns of performing an evaluation and management service by telephone and the availability of in person appointments. I also discussed with the patient that there may be a patient responsible charge related to this service. The patient expressed understanding and agreed to proceed.   Telephone visit today for painful sore in mouth and throat  HPI Started several weeks Painful bumps on tongue and throat  Very painful to swallow Denies any worsening GERD Rarely smokes  Fall Risk  01/16/2019 07/18/2018 05/24/2018 04/22/2018 03/30/2018  Falls in the past year? 0 No No Yes Yes  Number falls in past yr: - - - 2 or more 2 or more  Injury with Fall? 0 - - - No  Comment - - - - -  Follow up - - - - -     Depression screen [facility] 2/9 01/16/2019 07/18/2018 04/22/2018  Decreased Interest 0 0 0  Down, Depressed, Hopeless 0 0 0  PHQ - 2 Score 0 0 0  Altered sleeping - - -  Tired, decreased energy - - -  Change in appetite - - -  Feeling bad or failure about yourself  - - -  Trouble concentrating - - -  Moving slowly or fidgety/restless - - -  Suicidal thoughts - - -  PHQ-9 Score - - -  Difficult doing work/chores - - -  Some recent data might be hidden    Allergies  Allergen Reactions  . Coconut Oil Anaphylaxis and Itching  . Morphine And Related Anaphylaxis and Hives    Pt states that she has tolerated Norco and Dilaudid  . Oxycodone Anaphylaxis  . Ciprofloxacin Itching and Rash  . Doxycycline Nausea And Vomiting  . Adhesive [Tape] Rash    And paper tape causes a rash if wearing for a  prolong period of time  . Penicillins Rash    Has patient had a PCN reaction causing immediate rash, facial/tongue/throat swelling, SOB or lightheadedness with hypotension: Yes Has patient had a PCN reaction causing severe rash involving mucus membranes or skin necrosis: No Has patient had a PCN reaction that required hospitalization No Has patient had a PCN reaction occurring within the last 10 years: No If all of the above answers are "NO", then may proceed with Cephalosporin use.    Prior to Admission medications   Medication Sig Start Date End Date Taking? Authorizing Provider  albuterol (PROVENTIL HFA;VENTOLIN HFA) 108 (90 Base) MCG/ACT inhaler Inhale 2 puffs into the lungs every 6 (six) hours as needed for wheezing or shortness of breath. For shortness of breath 07/20/18   Delia Chimes A, MD  CALCIUM CITRATE PO 10 mLs by Feeding Tube route every other day. In the afternoon.    [provider]  docusate sodium (COLACE) 100 MG capsule Take 1 capsule (100 mg total) by mouth 2 (two) times daily. 03/11/18   Danae Orleans, PA-C  DULoxetine HCl 60 MG CSDR Take 60 mg by mouth 2 (two) times a day.    [provider]  ferrous sulfate (FERROUSUL) 325 (65 FE) MG tablet Take 1 tablet (325 mg total)  by mouth daily with breakfast. 05/24/18   Rutherford Guys, MD  fluconazole (DIFLUCAN) 150 MG tablet Take 1 tablet (150 mg total) by mouth daily. Repeat in 24 hours if needed 08/11/18   [name], MD  FLUoxetine (PROZAC) 20 MG/5ML solution Place 10 mLs (40 mg total) into feeding tube daily. 05/24/18   Rutherford Guys, MD  gabapentin (NEURONTIN) 300 MG capsule Take 1 capsule (300 mg total) by mouth 2 (two) times daily AND 2 capsules (600 mg total) at bedtime. 05/24/18   Rutherford Guys, MD  methocarbamol (ROBAXIN) 500 MG tablet Take 1 tablet (500 mg total) by mouth every 6 (six) hours as needed for muscle spasms. 03/15/18   Danae Orleans, PA-C  metoCLOPramide (REGLAN) 10 MG tablet  TAKE 1 TABLET BY MOUTH 3 TIMES A DAY BEFORE MEALS 12/08/18   Gatha Mayer, MD  Multiple Vitamin (MULTIVITAMIN) LIQD Place 10 mLs into feeding tube daily. Bariatric Liquid Multivitamin     [provider]  nitrofurantoin, macrocrystal-monohydrate, (MACROBID) 100 MG capsule Take 1 capsule (100 mg total) by mouth 2 (two) times daily. 11/29/18   Starr Lake, CNM  nortriptyline (PAMELOR) 10 MG capsule Start taking 1 tablet at bedtime x 1 week, then take 2 tablets at bedtime x 1 week, then can take 3 tablets at bedtime (30 mg) 01/02/19   Suzzanne Cloud, NP  Nutritional Supplements (FEEDING SUPPLEMENT, OSMOLITE 1.2 CAL,) LIQD Place 1,000 mLs into feeding tube continuous.    [provider]  ondansetron (ZOFRAN-ODT) 8 MG disintegrating tablet Take 1 tablet (8 mg total) by mouth every 8 (eight) hours as needed for nausea. 07/18/18   Rutherford Guys, MD  pantoprazole (PROTONIX) 40 MG tablet Take 1 tablet (40 mg total) by mouth 2 (two) times daily before a meal. 12/15/17   Wardell Honour, MD  polyethylene glycol (MIRALAX / Floria Raveling) packet Take 17 g by mouth 2 (two) times daily. 03/11/18   Danae Orleans, PA-C  thiamine (VITAMIN B-1) 100 MG tablet Take 100 mg by mouth daily at 12 noon.     [provider]  Vitamin D, Ergocalciferol, (DRISDOL) 50000 units CAPS capsule Take 1 capsule (50,000 Units total) by mouth every 7 (seven) days. 07/18/18   Rutherford Guys, MD    Past Medical History:  Diagnosis Date  . Anal fissure - posterior 10/16/2014    OCC  ISSUES  . Anxiety    doesn't take anything  . Asthma    has Albuterol inhaler as needed  . Boil    on pubic area started septra 03-01-17 draining blood and pus  . Bronchitis   . Chronic headache disorder 07/22/2016  . Clostridium difficile infection 04/20/2012  . Colitis   . Dehydration   . Depression   . Family history of adverse reaction to anesthesia    pt mom gets sick  . GERD (gastroesophageal reflux  disease)    takes Pantoprazole daily  . Headache   . Hiatal hernia    neuropathy - mild in arms and legs  . History of blood transfusion    last transfusion was 04/04/2016=Benadryl was given d/t itching. States she always itches with transfusion.   Marland Kitchen History of bronchitis    > 2 yrs ago  . History of colon polyps    benign  . History of migraine    last one 05/01/16  . History of stomach ulcers   . History of urinary tract infection LAST 2 WEEKS AGO  .  IDA (iron deficiency anemia)   . Internal and external bleeding hemorrhoids 06/11/2014  . Joint pain   . Joint swelling   . Left sided chronic colitis - segmental 06/11/2014  . Migraine   . Motion sickness   . Nausea    takes Zofran as needed  . Nausea and vomiting    for 1 year  . Obesity   . Oligouria   . Osteoarthritis    lower back, knees, wrists - no meds  . Pneumonia 1997  . Postoperative nausea and vomiting 01/21/2016   wants scopolamine patch  . SVD (spontaneous vaginal delivery)    x 4  . Tingling    BOTH LOWER EXTREMETIES ALL THE TIME DUE TO RAPID WEIGHT LOSS  . TOBACCO USER 10/02/2009   Qualifier: Diagnosis of  By: Dimas Millin MD, Ellard Artis    . Transfusion history    last transfusion 6'16   . Tremor 08/31/2018  . UC (ulcerative colitis) ([place])    supposed to be taking Lialda and Bentyl but has been off since gastric sleeve  . Vertigo    doesn't take any meds    Past Surgical History:  Procedure Laterality Date  . ABDOMINAL HYSTERECTOMY     PARTIAL  . COLONOSCOPY  2007   for rectal bleeding; [facility]  . COLONOSCOPY N/A 06/11/2014   Procedure: COLONOSCOPY;  Surgeon: Gatha Mayer, MD;  Location: [facility];  Service: Endoscopy;  Laterality: N/A;  . COLONOSCOPY WITH PROPOFOL N/A 03/02/2017   Procedure: COLONOSCOPY WITH PROPOFOL;  Surgeon: Alphonsa Overall, MD;  Location: [facility];  Service: General;  Laterality: N/A;  . DILATION AND CURETTAGE OF UTERUS    . ESOPHAGOGASTRODUODENOSCOPY (EGD) WITH PROPOFOL N/A 04/06/2016    Procedure: ESOPHAGOGASTRODUODENOSCOPY (EGD) WITH PROPOFOL;  Surgeon: Alphonsa Overall, MD;  Location: [facility];  Service: General;  Laterality: N/A;  . ESOPHAGOGASTRODUODENOSCOPY (EGD) WITH PROPOFOL N/A 03/02/2017   Procedure: ESOPHAGOGASTRODUODENOSCOPY (EGD) WITH PROPOFOL;  Surgeon: Alphonsa Overall, MD;  Location: [facility];  Service: General;  Laterality: N/A;  . ESOPHAGOGASTRODUODENOSCOPY (EGD) WITH PROPOFOL N/A 05/20/2017   Procedure: ESOPHAGOGASTRODUODENOSCOPY (EGD) WITH PROPOFOL ERAS PATHWAY;  Surgeon: Alphonsa Overall, MD;  Location: [facility];  Service: General;  Laterality: N/A;  . ESOPHAGOGASTRODUODENOSCOPY (EGD) WITH PROPOFOL N/A 10/07/2017   Procedure: ESOPHAGOGASTRODUODENOSCOPY (EGD) WITH PROPOFOL;  Surgeon: Alphonsa Overall, MD;  Location: [facility];  Service: General;  Laterality: N/A;  . EXCISION OF SKIN TAG  06/08/2017   Procedure: EXCISION OF VULVAR SKIN TAGS X2;  Surgeon: [name], MD;  Location: [facility];  Service: Gynecology;;  . FOOT SURGERY Bilateral    x 2  . GASTRIC ROUX-EN-Y N/A 12/14/2016   Procedure: LAPAROSCOPIC REVISION SLEEVE GASTRECTOMY TO  ROUX-Y-GASTRIC BY-PASS, UPPER ENDO;  Surgeon: Excell Seltzer, MD;  Location: [facility];  Service: General;  Laterality: N/A;  . GASTROJEJUNOSTOMY N/A 05/11/2016   Procedure: LAPAROSCOPIC PLACEMENT  OF FEEDING  JEJUNOSTOMY TUBE;  Surgeon: Excell Seltzer, MD;  Location: [facility];  Service: General;  Laterality: N/A;  . HEMORRHOIDECTOMY WITH HEMORRHOID BANDING    . IR FLUORO GUIDE CV LINE RIGHT  04/06/2017  . IR GENERIC HISTORICAL  05/19/2016   IR CM INJ ANY COLONIC TUBE W/FLUORO 05/19/2016 Aletta Edouard, MD [facility]  . IR PATIENT EVAL TECH 0-60 MINS  11/30/2017  . IR REPLC DUODEN/JEJUNO TUBE PERCUT W/FLUORO  03/14/2018  . IR REPLC DUODEN/JEJUNO TUBE PERCUT W/FLUORO  03/15/2018  . IR US GUIDE VASC ACCESS RIGHT  04/06/2017  . j tube removed march 2018    .  KNEE ARTHROSCOPY Left 09/06/2014  . LAPAROSCOPIC GASTRIC SLEEVE RESECTION  N/A 01/14/2016   Procedure: LAPAROSCOPIC GASTRIC SLEEVE RESECTION;  Surgeon: Excell Seltzer, MD;  Location: [facility];  Service: General;  Laterality: N/A;  . LAPAROSCOPIC REVISION OF GASTROJEJUNOSTOMY N/A 10/25/2017   Procedure: LAPAROSCOPIC REVISION OF GASTROJEJUNOSTOMY AND PARTIAL GASTRECTOMY, WITH PLACEMENT OF FEEDING GASTROSTOMY TUBE;  Surgeon: Excell Seltzer, MD;  Location: [facility];  Service: General;  Laterality: N/A;  . LAPAROSCOPIC TUBAL LIGATION  10/16/2011   Procedure: LAPAROSCOPIC TUBAL LIGATION;  Surgeon: Alwyn Pea, MD;  Location: [facility];  Service: Gynecology;  Laterality: Bilateral;  . NOVASURE ABLATION  09/28/2010   mild persistent vaginal bleeding  . right knee arthroscopy     05/07/2016  . TOTAL KNEE ARTHROPLASTY Left 03/10/2018   Procedure: LEFT TOTAL KNEE ARTHROPLASTY;  Surgeon: Paralee Cancel, MD;  Location: [facility];  Service: Orthopedics;  Laterality: Left;  70 mins  . TUBAL LIGATION    . VAGINAL HYSTERECTOMY Bilateral 06/08/2017   Procedure: HYSTERECTOMY VAGINAL W/ BILATERAL SALPINGECTOMY;  Surgeon: [name], MD;  Location: [facility];  Service: Gynecology;  Laterality: Bilateral;  . wisdom teeth extracted      Social History   Tobacco Use  . Smoking status: Former Smoker    Packs/day: 0.50    Years: 20.00    Pack years: 10.00    Types: Cigarettes    Last attempt to quit: 03/17/2014    Years since quitting: 4.8  . Smokeless tobacco: Never Used  Substance Use Topics  . Alcohol use: No    Alcohol/week: 0.0 standard drinks    Family History  Problem Relation Age of Onset  . Hypertension Mother   . Diabetes Mother   . Sarcoidosis Mother        lungs and skin  . Asthma Mother   . Hypertension Father   . Diabetes Father   . Asthma Son   . Tics Son   . Asthma Sister   . Cancer Sister        possible pancreatic cancer  . Adrenal disorder Sister        Tumor   . Asthma Brother   . Asthma Daughter        died age 70.5  . Cancer Daughter 4       brain;  died age 70.5  . Asthma Son   . Cancer Paternal Aunt        brain, colon, lung and esophagus; unsure of primary   . Breast cancer Paternal Aunt     ROS Per hpi  Objective  Vitals as reported by the patient: none      ASSESSMENT and PLAN  1. Oral candidiasis Discussed supportive measures, new meds r/se/b and RTC precautions.   Other orders - nystatin (MYCOSTATIN) 100000 UNIT/ML suspension; Take 5 mLs (500,000 Units total) by mouth 4 (four) times daily. Swish and swallow - lidocaine (XYLOCAINE) 2 % solution; Use as directed 10 mLs in the mouth or throat every 6 (six) hours as needed for mouth pain. Mix with 30 ml of mylanta - alum & mag hydroxide-simeth (MAALOX/MYLANTA) 200-200-20 MG/5ML suspension; Take 30 mLs by mouth every 6 (six) hours as needed for indigestion or heartburn. Mix with 10 ml of lidocaine  FOLLOW-UP: prn   The above assessment and management plan was discussed with the patient. The patient verbalized understanding of and has agreed to the management plan. Patient is aware to call the clinic if symptoms persist or worsen. Patient is aware  when to return to the clinic for a follow-up visit. Patient educated on when it is appropriate to go to the emergency department.    I provided 12 minutes of non-face-to-face time during this encounter.  Rutherford Guys, MD [facility] [place], [place] [address] Ph.  [phone] Fax [phone]

## 2019-01-16 NOTE — Progress Notes (Signed)
[name] is having pain in month in the back in her throat and her tongue. Says her tongue has blisters and spots on it and back of throat is inflammed. Says she will be uploading pictures. Says it is very painful to swallow and talk. She has been spraying her tongue with cloraseptic, and drinking protein shakes for the past 2 wks due to her symptoms. Also, having ear pain as well when she swallows. No travel, anxiety. Depression is being treated by meds, however was told by another doctor that she should use a booster to go along with the meds she is taking for depression.

## 2019-01-25 ENCOUNTER — Encounter: Payer: Self-pay | Admitting: General Surgery

## 2019-01-26 ENCOUNTER — Ambulatory Visit (INDEPENDENT_AMBULATORY_CARE_PROVIDER_SITE_OTHER): Payer: 59 | Admitting: Internal Medicine

## 2019-01-26 ENCOUNTER — Ambulatory Visit: Payer: 59 | Admitting: Internal Medicine

## 2019-01-26 ENCOUNTER — Other Ambulatory Visit: Payer: Self-pay

## 2019-01-26 ENCOUNTER — Encounter: Payer: Self-pay | Admitting: Internal Medicine

## 2019-01-26 VITALS — Ht 66.0 in | Wt 194.0 lb

## 2019-01-26 DIAGNOSIS — R1115 Cyclical vomiting syndrome unrelated to migraine: Secondary | ICD-10-CM | POA: Insufficient documentation

## 2019-01-26 DIAGNOSIS — K51511 Left sided colitis with rectal bleeding: Secondary | ICD-10-CM | POA: Diagnosis not present

## 2019-01-26 DIAGNOSIS — E538 Deficiency of other specified B group vitamins: Secondary | ICD-10-CM

## 2019-01-26 DIAGNOSIS — D508 Other iron deficiency anemias: Secondary | ICD-10-CM

## 2019-01-26 DIAGNOSIS — K5909 Other constipation: Secondary | ICD-10-CM

## 2019-01-26 MED ORDER — PANTOPRAZOLE SODIUM 40 MG PO TBEC: 40.0000 mg | DELAYED_RELEASE_TABLET | Freq: Two times a day (BID) | ORAL | 1 refills | Status: DC

## 2019-01-26 MED ORDER — METOCLOPRAMIDE HCL 10 MG PO TABS: 10.0000 mg | ORAL_TABLET | Freq: Three times a day (TID) | ORAL | 5 refills | Status: DC

## 2019-01-26 NOTE — Assessment & Plan Note (Signed)
Continue metaclopramide and protein shake diet No signs of tardive dyskinesia elicited

## 2019-01-26 NOTE — Patient Instructions (Addendum)
Good to talk today.  Glad things are at least stable.  We will check labs as discussed and then I will let you know the results and plans.  I refilled metaclopramide and pantoprazole today.   I appreciate the opportunity to care for you. Silvano Rusk, MD, [facility]

## 2019-01-26 NOTE — Assessment & Plan Note (Signed)
Recheck B12

## 2019-01-26 NOTE — Assessment & Plan Note (Signed)
F/U iron studies

## 2019-01-26 NOTE — Assessment & Plan Note (Signed)
Continue linzess

## 2019-01-26 NOTE — Progress Notes (Signed)
[facility]     TELEHEALTH ENCOUNTER IN SETTING OF COVID-19 PANDEMIC - REQUESTED BY PATIENT SERVICE PROVIDED BY TELEMEDECINE - TYPE: Attempted Zoom A/V converted to telephone  PATIENT LOCATION: Home PATIENT HAS CONSENTED TO TELEHEALTH VISIT PROVIDER LOCATION: OFFICE  PARTICIPANTS OTHER THAN PATIENT: None TIME SPENT ON CALL: 15 minutes    [name] 42 y.o. 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 Self-limited, spontaneous remission? Bleeding now but in past I have thought hemorrhoids  Chronic constipation Continue linzess     Subjective:   Chief Complaint: Follow-up of chronic vomiting  HPI [name] is following up she has persistent vomiting after gastric sleeve surgery and 2 revisions.  If she eats solid food as soon as it hits her stomach it seems to come back.  She is existing on protein shakes and maintaining her diet.  She has headache problems and dizziness issues and went back to neurology and her headaches are improved but the vomiting still persists.  Linzess treats chronic constipation.  Sometimes there is some back pressure related to that.  She also has a history of a segmental inflammatory condition of the colon with chronic colitis on biopsies but that seemed to spontaneously regress and was not present on colonoscopy in 2019.  She still has intermittent rectal bleeding she has hemorrhoids sometimes she just has pure blood and sometimes she has blood and brown stool together.  This is really relatively chronic and preceded her colonoscopy in late 2019. Allergies  Allergen Reactions  . Coconut Oil Anaphylaxis and Itching  . Morphine And Related Anaphylaxis and Hives    Pt states  that she has tolerated Norco and Dilaudid  . Oxycodone Anaphylaxis  . Ciprofloxacin Itching and Rash  . Doxycycline Nausea And Vomiting  . Adhesive [Tape] Rash    And paper tape causes a rash if wearing for a prolong period of time  . Penicillins Rash    Has patient had a PCN reaction causing immediate rash, facial/tongue/throat swelling, SOB or lightheadedness with hypotension: Yes Has patient had a PCN reaction causing severe rash involving mucus membranes or skin necrosis: No Has patient had a PCN reaction that required hospitalization No Has patient had a PCN reaction occurring within the last 10 years: No If all of the above answers are "NO", then may proceed with Cephalosporin use.   Current Meds  Medication Sig  . albuterol (PROVENTIL HFA;VENTOLIN HFA) 108 (90 Base) MCG/ACT inhaler Inhale 2 puffs into the lungs every 6 (six) hours as needed for wheezing or shortness of breath. For shortness of breath  . alum & mag hydroxide-simeth (MAALOX/MYLANTA) 200-200-20 MG/5ML suspension Take 30 mLs by mouth every 6 (six) hours as needed for indigestion or heartburn. Mix with 10 ml of lidocaine  . CALCIUM CITRATE PO 10 mLs by Feeding Tube route every other day. In the afternoon.  . docusate sodium (COLACE) 100 MG capsule Take 1 capsule (100 mg total) by mouth 2 (two) times daily.  . DULoxetine HCl 60 MG CSDR Take 60 mg by mouth 2 (two) times a day.  . ferrous sulfate (FERROUSUL) 325 (65 FE) MG tablet Take 1 tablet (325 mg total) by mouth daily with breakfast.  . fluconazole (DIFLUCAN) 150 MG tablet Take 1  tablet (150 mg total) by mouth daily. Repeat in 24 hours if needed  . FLUoxetine (PROZAC) 20 MG/5ML solution Place 10 mLs (40 mg total) into feeding tube daily.  Marland Kitchen gabapentin (NEURONTIN) 300 MG capsule Take 1 capsule (300 mg total) by mouth 2 (two) times daily AND 2 capsules (600 mg total) at bedtime. (Patient taking differently: Take 1 capsule (300 mg total) by mouth 3 times daily AND 2 capsules  (600 mg total) at bedtime.)  . HYDROcodone-acetaminophen (NORCO) 10-325 MG tablet Take 1 tablet by mouth every 6 (six) hours as needed.  . lidocaine (XYLOCAINE) 2 % solution Use as directed 10 mLs in the mouth or throat every 6 (six) hours as needed for mouth pain. Mix with 30 ml of mylanta  . linaclotide (LINZESS) 290 MCG CAPS capsule Take 290 mcg by mouth daily before breakfast.  . methocarbamol (ROBAXIN) 500 MG tablet Take 1 tablet (500 mg total) by mouth every 6 (six) hours as needed for muscle spasms.  . metoCLOPramide (REGLAN) 10 MG tablet Take 1 tablet (10 mg total) by mouth 3 (three) times daily before meals.  . Multiple Vitamin (MULTIVITAMIN) LIQD Place 10 mLs into feeding tube daily. Bariatric Liquid Multivitamin   . nitrofurantoin, macrocrystal-monohydrate, (MACROBID) 100 MG capsule Take 1 capsule (100 mg total) by mouth 2 (two) times daily.  . nortriptyline (PAMELOR) 10 MG capsule Start taking 1 tablet at bedtime x 1 week, then take 2 tablets at bedtime x 1 week, then can take 3 tablets at bedtime (30 mg)  . Nutritional Supplements (FEEDING SUPPLEMENT, OSMOLITE 1.2 CAL,) LIQD Place 1,000 mLs into feeding tube continuous.  Marland Kitchen nystatin (MYCOSTATIN) 100000 UNIT/ML suspension Take 5 mLs (500,000 Units total) by mouth 4 (four) times daily. Swish and swallow  . ondansetron (ZOFRAN-ODT) 8 MG disintegrating tablet Take 1 tablet (8 mg total) by mouth every 8 (eight) hours as needed for nausea.  . pantoprazole (PROTONIX) 40 MG tablet Take 1 tablet (40 mg total) by mouth 2 (two) times daily before a meal.  . polyethylene glycol (MIRALAX / GLYCOLAX) packet Take 17 g by mouth 2 (two) times daily.  Marland Kitchen thiamine (VITAMIN B-1) 100 MG tablet Take 100 mg by mouth daily at 12 noon.   . Vitamin D, Ergocalciferol, (DRISDOL) 50000 units CAPS capsule Take 1 capsule (50,000 Units total) by mouth every 7 (seven) days.  . [DISCONTINUED] metoCLOPramide (REGLAN) 10 MG tablet TAKE 1 TABLET BY MOUTH 3 TIMES A DAY BEFORE  MEALS  . [DISCONTINUED] pantoprazole (PROTONIX) 40 MG tablet Take 1 tablet (40 mg total) by mouth 2 (two) times daily before a meal.   Current Facility-Administered Medications for the 01/26/19 encounter (Office Visit) with Gatha Mayer, MD  Medication  . cyanocobalamin ((VITAMIN B-12)) injection 1,000 mcg  . cyanocobalamin ((VITAMIN B-12)) injection 1,000 mcg   Past Medical History:  Diagnosis Date  . Anal fissure - posterior 10/16/2014    OCC  ISSUES  . Anxiety    doesn't take anything  . Asthma    has Albuterol inhaler as needed  . Boil    on pubic area started septra 03-01-17 draining blood and pus  . Bronchitis   . Chronic headache disorder 07/22/2016  . Clostridium difficile infection 04/20/2012  . Colitis   . Dehydration   . Depression   . Family history of adverse reaction to anesthesia    pt mom gets sick  . GERD (gastroesophageal reflux disease)    takes Pantoprazole daily  . Headache   .  Hiatal hernia    neuropathy - mild in arms and legs  . History of blood transfusion    last transfusion was 04/04/2016=Benadryl was given d/t itching. States she always itches with transfusion.   Marland Kitchen History of bronchitis    > 2 yrs ago  . History of colon polyps    benign  . History of migraine    last one 05/01/16  . History of stomach ulcers   . History of urinary tract infection LAST 2 WEEKS AGO  . IDA (iron deficiency anemia)   . Internal and external bleeding hemorrhoids 06/11/2014  . Joint pain   . Joint swelling   . Left sided chronic colitis - segmental 06/11/2014  . Marginal ulcer 10/25/2017  . Migraine   . Motion sickness   . Nausea    takes Zofran as needed  . Nausea and vomiting    for 1 year  . Obesity   . Oligouria   . Osteoarthritis    lower back, knees, wrists - no meds  . Pneumonia 1997  . Postoperative nausea and vomiting 01/21/2016   wants scopolamine patch  . SVD (spontaneous vaginal delivery)    x 4  . Tingling    BOTH LOWER EXTREMETIES ALL THE  TIME DUE TO RAPID WEIGHT LOSS  . TOBACCO USER 10/02/2009   Qualifier: Diagnosis of  By: Dimas Millin MD, Ellard Artis    . Transfusion history    last transfusion 6'16   . Tremor 08/31/2018  . UC (ulcerative colitis) ([place])    supposed to be taking Lialda and Bentyl but has been off since gastric sleeve  . Vertigo    doesn't take any meds   Past Surgical History:  Procedure Laterality Date  . ABDOMINAL HYSTERECTOMY     PARTIAL  . COLONOSCOPY  2007   for rectal bleeding; [facility]  . COLONOSCOPY N/A 06/11/2014   Procedure: COLONOSCOPY;  Surgeon: Gatha Mayer, MD;  Location: [facility];  Service: Endoscopy;  Laterality: N/A;  . COLONOSCOPY WITH PROPOFOL N/A 03/02/2017   Procedure: COLONOSCOPY WITH PROPOFOL;  Surgeon: Alphonsa Overall, MD;  Location: [facility];  Service: General;  Laterality: N/A;  . DILATION AND CURETTAGE OF UTERUS    . ESOPHAGOGASTRODUODENOSCOPY (EGD) WITH PROPOFOL N/A 04/06/2016   Procedure: ESOPHAGOGASTRODUODENOSCOPY (EGD) WITH PROPOFOL;  Surgeon: Alphonsa Overall, MD;  Location: [facility];  Service: General;  Laterality: N/A;  . ESOPHAGOGASTRODUODENOSCOPY (EGD) WITH PROPOFOL N/A 03/02/2017   Procedure: ESOPHAGOGASTRODUODENOSCOPY (EGD) WITH PROPOFOL;  Surgeon: Alphonsa Overall, MD;  Location: [facility];  Service: General;  Laterality: N/A;  . ESOPHAGOGASTRODUODENOSCOPY (EGD) WITH PROPOFOL N/A 05/20/2017   Procedure: ESOPHAGOGASTRODUODENOSCOPY (EGD) WITH PROPOFOL ERAS PATHWAY;  Surgeon: Alphonsa Overall, MD;  Location: [facility];  Service: General;  Laterality: N/A;  . ESOPHAGOGASTRODUODENOSCOPY (EGD) WITH PROPOFOL N/A 10/07/2017   Procedure: ESOPHAGOGASTRODUODENOSCOPY (EGD) WITH PROPOFOL;  Surgeon: Alphonsa Overall, MD;  Location: [facility];  Service: General;  Laterality: N/A;  . EXCISION OF SKIN TAG  06/08/2017   Procedure: EXCISION OF VULVAR SKIN TAGS X2;  Surgeon: [name], MD;  Location: [facility];  Service: Gynecology;;  . FOOT SURGERY Bilateral    x 2  . GASTRIC ROUX-EN-Y N/A  12/14/2016   Procedure: LAPAROSCOPIC REVISION SLEEVE GASTRECTOMY TO  ROUX-Y-GASTRIC BY-PASS, UPPER ENDO;  Surgeon: Excell Seltzer, MD;  Location: [facility];  Service: General;  Laterality: N/A;  . GASTROJEJUNOSTOMY N/A 05/11/2016   Procedure: LAPAROSCOPIC PLACEMENT  OF FEEDING  JEJUNOSTOMY TUBE;  Surgeon: Excell Seltzer, MD;  Location: [facility];  Service: General;  Laterality: N/A;  . HEMORRHOIDECTOMY WITH HEMORRHOID BANDING    . IR FLUORO GUIDE CV LINE RIGHT  04/06/2017  . IR GENERIC HISTORICAL  05/19/2016   IR CM INJ ANY COLONIC TUBE W/FLUORO 05/19/2016 Aletta Edouard, MD [facility]  . IR PATIENT EVAL TECH 0-60 MINS  11/30/2017  . IR REPLC DUODEN/JEJUNO TUBE PERCUT W/FLUORO  03/14/2018  . IR REPLC DUODEN/JEJUNO TUBE PERCUT W/FLUORO  03/15/2018  . IR US GUIDE VASC ACCESS RIGHT  04/06/2017  . j tube removed march 2018    . KNEE ARTHROSCOPY Left 09/06/2014  . LAPAROSCOPIC GASTRIC SLEEVE RESECTION N/A 01/14/2016   Procedure: LAPAROSCOPIC GASTRIC SLEEVE RESECTION;  Surgeon: Excell Seltzer, MD;  Location: [facility];  Service: General;  Laterality: N/A;  . LAPAROSCOPIC REVISION OF GASTROJEJUNOSTOMY N/A 10/25/2017   Procedure: LAPAROSCOPIC REVISION OF GASTROJEJUNOSTOMY AND PARTIAL GASTRECTOMY, WITH PLACEMENT OF FEEDING GASTROSTOMY TUBE;  Surgeon: Excell Seltzer, MD;  Location: [facility];  Service: General;  Laterality: N/A;  . LAPAROSCOPIC TUBAL LIGATION  10/16/2011   Procedure: LAPAROSCOPIC TUBAL LIGATION;  Surgeon: Alwyn Pea, MD;  Location: [facility];  Service: Gynecology;  Laterality: Bilateral;  . NOVASURE ABLATION  09/28/2010   mild persistent vaginal bleeding  . right knee arthroscopy     05/07/2016  . TOTAL KNEE ARTHROPLASTY Left 03/10/2018   Procedure: LEFT TOTAL KNEE ARTHROPLASTY;  Surgeon: Paralee Cancel, MD;  Location: [facility];  Service: Orthopedics;  Laterality: Left;  70 mins  . TUBAL LIGATION    . VAGINAL HYSTERECTOMY Bilateral 06/08/2017   Procedure: HYSTERECTOMY VAGINAL W/ BILATERAL  SALPINGECTOMY;  Surgeon: [name], MD;  Location: [facility];  Service: Gynecology;  Laterality: Bilateral;  . wisdom teeth extracted     Social History   Social History Narrative   Marital status:  Single; not dating seriously.      Children:  3 sons (70, 8, 64) whose father is deceased, 1 daughter (deceased age 9); no grandchildren      Lives:  With 2 sons      Employment:  Long term disability in 2018 due to PICC line for iv fluids; Corporate treasurer at [employer]      Tobacco: quit June 2015      Alcohol:  None      Drugs:  None      Exercise:  Leg lifts for knee strengthening; walking   1 caffeine beverage daily      Sexual activity:  Active; total partners = up there.  No STDs in past.        Right-handed   family history includes Adrenal disorder in her sister; Asthma in her brother, daughter, mother, sister, son, and son; Breast cancer in her paternal aunt; Cancer in her paternal aunt and sister; Cancer (age of onset: 42) in her daughter; Diabetes in her father and mother; Hypertension in her father and mother; Sarcoidosis in her mother; Tics in her son.   Review of Systems See HPI for GI neuro constitutional.

## 2019-01-26 NOTE — Assessment & Plan Note (Signed)
Neg bxs off Tx 06/2018 colonoscopy so ? Self-limited, spontaneous remission? Bleeding now but in past I have thought hemorrhoids

## 2019-01-27 ENCOUNTER — Other Ambulatory Visit: Payer: Self-pay | Admitting: Neurology

## 2019-01-30 ENCOUNTER — Other Ambulatory Visit (INDEPENDENT_AMBULATORY_CARE_PROVIDER_SITE_OTHER): Payer: 59

## 2019-01-30 DIAGNOSIS — R1115 Cyclical vomiting syndrome unrelated to migraine: Secondary | ICD-10-CM | POA: Diagnosis not present

## 2019-01-30 DIAGNOSIS — E538 Deficiency of other specified B group vitamins: Secondary | ICD-10-CM | POA: Diagnosis not present

## 2019-01-30 DIAGNOSIS — D508 Other iron deficiency anemias: Secondary | ICD-10-CM | POA: Diagnosis not present

## 2019-01-30 LAB — COMPREHENSIVE METABOLIC PANEL
ALT: 8 U/L (ref 0–35)
AST: 14 U/L (ref 0–37)
Albumin: 3.8 g/dL (ref 3.5–5.2)
Alkaline Phosphatase: 67 U/L (ref 39–117)
BUN: 14 mg/dL (ref 6–23)
CO2: 25 mEq/L (ref 19–32)
Calcium: 8.6 mg/dL (ref 8.4–10.5)
Chloride: 108 mEq/L (ref 96–112)
Creatinine, Ser: 0.75 mg/dL (ref 0.40–1.20)
GFR: 103.08 mL/min (ref >60.00)
Glucose, Bld: 65 mg/dL — ABNORMAL LOW (ref 70–99)
Potassium: 4.1 mEq/L (ref 3.5–5.1)
Sodium: 139 mEq/L (ref 135–145)
Total Bilirubin: 0.2 mg/dL (ref 0.2–1.2)
Total Protein: 7.3 g/dL (ref 6.0–8.3)

## 2019-01-30 LAB — CBC
HCT: 35.2 % — ABNORMAL LOW (ref 36.0–46.0)
Hemoglobin: 11.5 g/dL — ABNORMAL LOW (ref 12.0–15.0)
MCHC: 32.7 g/dL (ref 30.0–36.0)
MCV: 89.5 fl (ref 78.0–100.0)
Platelets: 328 10*3/uL (ref 150.0–400.0)
RBC: 3.93 Mil/uL (ref 3.87–5.11)
RDW: 13.9 % (ref 11.5–15.5)
WBC: 9.3 10*3/uL (ref 4.0–10.5)

## 2019-01-30 LAB — VITAMIN B12: Vitamin B-12: 563 pg/mL (ref 211–911)

## 2019-01-30 LAB — FERRITIN: Ferritin: 8.5 ng/mL — ABNORMAL LOW (ref 10.0–291.0)

## 2019-01-31 DIAGNOSIS — M5136 Other intervertebral disc degeneration, lumbar region: Secondary | ICD-10-CM | POA: Diagnosis not present

## 2019-01-31 DIAGNOSIS — M5030 Other cervical disc degeneration, unspecified cervical region: Secondary | ICD-10-CM | POA: Diagnosis not present

## 2019-01-31 DIAGNOSIS — M25562 Pain in left knee: Secondary | ICD-10-CM | POA: Diagnosis not present

## 2019-01-31 NOTE — Progress Notes (Signed)
[name]  Most bloodwork was fine but ferritin (iron) and the hemoglobin are low.  You cannot absorb iron well so I think boosting with an IN treatment called feraheme would help a lot.  We can set that up if you want.  Let me know  Gatha Mayer, MD, [facility]

## 2019-02-02 ENCOUNTER — Other Ambulatory Visit: Payer: Self-pay

## 2019-02-02 ENCOUNTER — Telehealth: Payer: Self-pay

## 2019-02-02 DIAGNOSIS — D508 Other iron deficiency anemias: Secondary | ICD-10-CM

## 2019-02-02 NOTE — Telephone Encounter (Signed)
Please schedule feraheme x 2 and a CBC ferritin 1 month after second feraheme please      When placing CBC/ferritin orders set expiration date for 1 month after the approximate date they are ordered so if she fails to come I will get a message    ----- Message -----  From: Algernon Huxley, RN  Sent: 02/01/2019  9:15 AM EDT  To: Gatha Mayer, MD  Subject: FW: Visit Follow-Up Question               ----- Message -----  From: Laurita Quint  Sent: 02/01/2019  9:00 AM EDT  To: [facility]  Subject: Visit Follow-Up Question               Yes we can go ahead and schedule the treatment   Feraheme orders in epic. Called pt care center to schedule feraheme and they were already closed for the day.

## 2019-02-03 NOTE — Telephone Encounter (Signed)
5/13 at 9 am and 5/20 at 9 am Patient [name] The patient has been notified of this information and all questions answered. Lab order in Epic for 1 month after last IV iron infusion.

## 2019-02-08 ENCOUNTER — Other Ambulatory Visit: Payer: Self-pay

## 2019-02-08 ENCOUNTER — Ambulatory Visit (HOSPITAL_COMMUNITY)
Admission: RE | Admit: 2019-02-08 | Discharge: 2019-02-08 | Disposition: A | Discharge status: Home / Self Care | Payer: 59 | Source: Ambulatory Visit | Attending: Internal Medicine | Admitting: Internal Medicine

## 2019-02-08 DIAGNOSIS — D508 Other iron deficiency anemias: Secondary | ICD-10-CM

## 2019-02-08 MED ORDER — SODIUM CHLORIDE 0.9 % IV SOLN
INTRAVENOUS | Status: DC | PRN
  Administered 2019-02-08: 250 mL via INTRAVENOUS

## 2019-02-08 MED ORDER — SODIUM CHLORIDE 0.9 % IV SOLN
510.0000 mg | INTRAVENOUS | Status: DC
  Administered 2019-02-08: 510 mg via INTRAVENOUS
  Filled 2019-02-08: qty 17

## 2019-02-08 NOTE — Progress Notes (Signed)
Patient received Feraheme via a PIV as ordered. Observed for at least 30 minutes post infusion.Tolerated well, vitals stable, discharge instructions given, verbalized understanding. Patient alert, oriented and ambulatory at the time of discharge.

## 2019-02-08 NOTE — Discharge Instructions (Signed)

## 2019-02-15 ENCOUNTER — Ambulatory Visit (HOSPITAL_COMMUNITY)
Admission: RE | Admit: 2019-02-15 | Discharge: 2019-02-15 | Disposition: A | Discharge status: Home / Self Care | Payer: 59 | Source: Ambulatory Visit | Attending: Internal Medicine | Admitting: Internal Medicine

## 2019-02-15 ENCOUNTER — Other Ambulatory Visit: Payer: Self-pay

## 2019-02-15 DIAGNOSIS — D508 Other iron deficiency anemias: Secondary | ICD-10-CM | POA: Diagnosis not present

## 2019-02-15 MED ORDER — SODIUM CHLORIDE 0.9 % IV SOLN
510.0000 mg | Freq: Once | INTRAVENOUS | Status: AC
  Administered 2019-02-15: 10:00:00 510 mg via INTRAVENOUS
  Filled 2019-02-15: qty 17

## 2019-02-15 MED ORDER — SODIUM CHLORIDE 0.9 % IV SOLN
INTRAVENOUS | Status: DC | PRN
  Administered 2019-02-15: 250 mL via INTRAVENOUS

## 2019-02-15 NOTE — Progress Notes (Signed)
Patient received Feraheme via PIV. Observed for at least 30 minutes post infusion.Tolerated well, vitals stable, discharge instructions given, verbalized understanding. Patient alert, oriented and ambulatory at the time of discharge.  

## 2019-02-15 NOTE — Discharge Instructions (Signed)

## 2019-02-16 ENCOUNTER — Ambulatory Visit: Payer: Self-pay

## 2019-02-16 ENCOUNTER — Encounter: Payer: Self-pay | Admitting: Emergency Medicine

## 2019-02-16 ENCOUNTER — Telehealth (INDEPENDENT_AMBULATORY_CARE_PROVIDER_SITE_OTHER): Payer: 59 | Admitting: Emergency Medicine

## 2019-02-16 VITALS — Ht 67.0 in | Wt 195.0 lb

## 2019-02-16 DIAGNOSIS — J988 Other specified respiratory disorders: Secondary | ICD-10-CM

## 2019-02-16 DIAGNOSIS — B9789 Other viral agents as the cause of diseases classified elsewhere: Secondary | ICD-10-CM | POA: Diagnosis not present

## 2019-02-16 NOTE — Telephone Encounter (Signed)
Pt called stating since yesterday she has had symptoms of flu or COVID-19.  She states that she is chilled has a dry cough,sore throat,headache and her rib are sore.  She states that today she feels SOB. She has a Hx of asthma and states that she is wheezing but not enough to use her nebulizer. She is unsure about a fever but she has chills.  She notices a loss of taste. 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 Care advice read to patient. Patient verbalized understanding of all instructions.  Call transferred to office for appointment scheduling.  Reason for Disposition . MILD difficulty breathing (e.g., minimal/no SOB at rest, SOB with walking, pulse <100)  Answer Assessment - Initial Assessment Questions 1. COVID-19 DIAGNOSIS: "Who made your Coronavirus (COVID-19) diagnosis?" "Was it confirmed by a positive lab test?" If not diagnosed by a HCP, ask "Are there lots of cases (community spread) where you live?" (See public health department website, if unsure)   * MAJOR community spread: high number of cases; numbers of cases are increasing; many people hospitalized.   * MINOR community spread: low number of cases; not increasing; few or no people hospitalized     [place] 2. ONSET: "When did the COVID-19 symptoms start?"      yesterday 3. WORST SYMPTOM: "What is your worst symptom?" (e.g., cough, fever, shortness of breath, muscle aches)    Muscle aches SOB 4. COUGH: "Do you have a cough?" If so, ask: "How bad is the cough?"       Yes dry 5. FEVER: "Do you have a fever?" If so, ask: "What is your temperature, how was it measured, and when did it start?"     Unsure but has had chills 6. RESPIRATORY STATUS: "Describe your breathing?" (e.g., shortness of breath, wheezing, unable to speak)      SOB minor wheezing  7. BETTER-SAME-WORSE: "Are you getting better, staying the same or getting worse compared to yesterday?"  If getting worse, ask, "In what way?"     worse 8. HIGH RISK DISEASE:  "Do you have any chronic medical problems?" (e.g., asthma, heart or lung disease, weak immune system, etc.)     Asthma, weak immune system 9. PREGNANCY: "Is there any chance you are pregnant?" "When was your last menstrual period?"    hysterectomy 10. OTHER SYMPTOMS: "Do you have any other symptoms?"  (e.g., runny nose, headache, sore throat, loss of smell)       Sore throat, headache, loss of taste  Protocols used: CORONAVIRUS (COVID-19) DIAGNOSED OR SUSPECTED-A-AH

## 2019-02-16 NOTE — Telephone Encounter (Signed)
Pt needs tele or virtual for covid symptoms

## 2019-02-16 NOTE — Progress Notes (Signed)
Telemedicine Encounter- SOAP NOTE Established Patient  This telephone encounter was conducted with the patient's (or proxy's) verbal consent via audio telecommunications: yes/no: Yes Patient was instructed to have this encounter in a suitably private space; and to only have persons present to whom they give permission to participate. In addition, patient identity was confirmed by use of name plus two identifiers (DOB and address).  I discussed the limitations, risks, security and privacy concerns of performing an evaluation and management service by telephone and the availability of in person appointments. I also discussed with the patient that there may be a patient responsible charge related to this service. The patient expressed understanding and agreed to proceed.    No chief complaint on file. Flulike symptoms for 2 to 3 days  Subjective   [name] is a 43 y.o. female established patient.  Has history of asthma and iron deficiency anemia.  First visit with me.  Telephone visit today for evaluation of flulike symptoms that started 2 3 days ago.  Patient complaining of generalized body soreness, sore throat, cough at times productive, dyspnea on exertion, migraine headache, and chills.  Denies fever, nausea, vomiting, vomiting, or any other significant symptoms.  44 year old son with mild cough at home.  60 year old healthy.  No one else sick at home.  HPI   Patient Active Problem List   Diagnosis Date Noted  . Chronic constipation 01/26/2019  . 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 B12 deficiency 12/01/2017  . Malnutrition, calorie ([place]) 03/27/2017  . Psychophysiological insomnia 03/18/2017  . Chronic headache disorder 07/22/2016  . Memory difficulty 07/22/2016  . 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 Anemia, iron deficiency 10/01/2015  . Left sided chronic colitis - segmental 06/11/2014  . Asthma 04/26/2012  . 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 Anal fissure - posterior 10/16/2014    OCC  ISSUES  . Anxiety    doesn't take anything  . Asthma    has Albuterol inhaler as needed  . Boil    on pubic area started septra 03-01-17 draining blood and pus  . Bronchitis   . Chronic headache disorder 07/22/2016  . Clostridium difficile infection 04/20/2012  . Colitis   . Dehydration   . Depression   . Family history of adverse reaction to anesthesia    pt mom gets sick  . GERD (gastroesophageal reflux disease)    takes Pantoprazole daily  . Headache   . Hiatal hernia    neuropathy - mild in arms and legs  . History of blood transfusion    last transfusion was 04/04/2016=Benadryl was given d/t itching. States she always itches with transfusion.   Marland Kitchen History of bronchitis    > 2 yrs ago  . History of colon polyps    benign  . History of migraine    last one 05/01/16  . History of stomach ulcers   . History of urinary tract infection LAST 2 WEEKS AGO  . IDA (iron deficiency anemia)   . Internal and external bleeding hemorrhoids 06/11/2014  . Joint pain   . Joint swelling   . Left sided chronic colitis - segmental 06/11/2014  . Marginal ulcer 10/25/2017  . Migraine   . Motion sickness   . Nausea    takes Zofran as needed  . Nausea and vomiting    for 1 year  . Obesity   . Oligouria   . Osteoarthritis    lower  back, knees, wrists - no meds  . Pneumonia 1997  . Postoperative nausea and vomiting 01/21/2016   wants scopolamine patch  . SVD (spontaneous vaginal delivery)    x 4  . Tingling    BOTH LOWER EXTREMETIES ALL THE TIME DUE TO RAPID WEIGHT LOSS  . TOBACCO USER 10/02/2009   Qualifier: Diagnosis of  By: Dimas Millin MD, Ellard Artis    . Transfusion history    last transfusion 6'16   . Tremor 08/31/2018  . UC (ulcerative colitis) ([place])    supposed to be taking Lialda and Bentyl but has been off since gastric sleeve  . Vertigo    doesn't take any meds    Current Outpatient Medications  Medication Sig  Dispense Refill  . albuterol (PROVENTIL HFA;VENTOLIN HFA) 108 (90 Base) MCG/ACT inhaler Inhale 2 puffs into the lungs every 6 (six) hours as needed for wheezing or shortness of breath. For shortness of breath 54 g 0  . CALCIUM CITRATE PO 10 mLs by Feeding Tube route every other day. In the afternoon.    . docusate sodium (COLACE) 100 MG capsule Take 1 capsule (100 mg total) by mouth 2 (two) times daily. 10 capsule 0  . DULoxetine HCl 60 MG CSDR Take 60 mg by mouth 2 (two) times a day.    . ferrous sulfate (FERROUSUL) 325 (65 FE) MG tablet Take 1 tablet (325 mg total) by mouth daily with breakfast. 90 tablet 3  . FLUoxetine (PROZAC) 20 MG/5ML solution Place 10 mLs (40 mg total) into feeding tube daily. 360 mL 3  . gabapentin (NEURONTIN) 300 MG capsule Take 1 capsule (300 mg total) by mouth 2 (two) times daily AND 2 capsules (600 mg total) at bedtime. (Patient taking differently: Take 1 capsule (300 mg total) by mouth 3 times daily AND 2 capsules (600 mg total) at bedtime.) 120 capsule 3  . HYDROcodone-acetaminophen (NORCO) 10-325 MG tablet Take 1 tablet by mouth every 6 (six) hours as needed.    . linaclotide (LINZESS) 290 MCG CAPS capsule Take 290 mcg by mouth daily before breakfast.    . metoCLOPramide (REGLAN) 10 MG tablet Take 1 tablet (10 mg total) by mouth 3 (three) times daily before meals. 90 tablet 5  . Multiple Vitamin (MULTIVITAMIN) LIQD Place 10 mLs into feeding tube daily. Bariatric Liquid Multivitamin     . nitrofurantoin, macrocrystal-monohydrate, (MACROBID) 100 MG capsule Take 1 capsule (100 mg total) by mouth 2 (two) times daily. 14 capsule 0  . nortriptyline (PAMELOR) 10 MG capsule START TAKING 1 TABLET AT BEDTIME X 1 WEEK, THEN TAKE 2 TABLETS AT BEDTIME X 1 WEEK, THEN CAN TAKE 3 TABLETS AT BEDTIME 270 capsule 1  . Nutritional Supplements (FEEDING SUPPLEMENT, OSMOLITE 1.2 CAL,) LIQD Place 1,000 mLs into feeding tube continuous.    Marland Kitchen nystatin (MYCOSTATIN) 100000 UNIT/ML suspension Take  5 mLs (500,000 Units total) by mouth 4 (four) times daily. Swish and swallow 120 mL 0  . ondansetron (ZOFRAN-ODT) 8 MG disintegrating tablet Take 1 tablet (8 mg total) by mouth every 8 (eight) hours as needed for nausea. 20 tablet 5  . pantoprazole (PROTONIX) 40 MG tablet Take 1 tablet (40 mg total) by mouth 2 (two) times daily before a meal. 180 tablet 1  . polyethylene glycol (MIRALAX / GLYCOLAX) packet Take 17 g by mouth 2 (two) times daily. 14 each 0  . thiamine (VITAMIN B-1) 100 MG tablet Take 100 mg by mouth daily at 12 noon.     . Vitamin D,  Ergocalciferol, (DRISDOL) 50000 units CAPS capsule Take 1 capsule (50,000 Units total) by mouth every 7 (seven) days. 12 capsule 0  . alum & mag hydroxide-simeth (MAALOX/MYLANTA) 200-200-20 MG/5ML suspension Take 30 mLs by mouth every 6 (six) hours as needed for indigestion or heartburn. Mix with 10 ml of lidocaine (Patient not taking: Reported on 02/16/2019) 355 mL 0  . fluconazole (DIFLUCAN) 150 MG tablet Take 1 tablet (150 mg total) by mouth daily. Repeat in 24 hours if needed (Patient not taking: Reported on 02/16/2019) 2 tablet 2  . lidocaine (XYLOCAINE) 2 % solution Use as directed 10 mLs in the mouth or throat every 6 (six) hours as needed for mouth pain. Mix with 30 ml of mylanta (Patient not taking: Reported on 02/16/2019) 100 mL 0  . methocarbamol (ROBAXIN) 500 MG tablet Take 1 tablet (500 mg total) by mouth every 6 (six) hours as needed for muscle spasms. 40 tablet 0   Current Facility-Administered Medications  Medication Dose Route Frequency Provider Last Rate Last Dose  . cyanocobalamin ((VITAMIN B-12)) injection 1,000 mcg  1,000 mcg Intramuscular Q30 days Wardell Honour, MD   1,000 mcg at 04/22/18 1044  . cyanocobalamin ((VITAMIN B-12)) injection 1,000 mcg  1,000 mcg Intramuscular Q30 days Wardell Honour, MD   1,000 mcg at 07/15/18 2751    Allergies  Allergen Reactions  . Coconut Oil Anaphylaxis and Itching  . Morphine And Related  Anaphylaxis and Hives    Pt states that she has tolerated Norco and Dilaudid  . Oxycodone Anaphylaxis  . Ciprofloxacin Itching and Rash  . Doxycycline Nausea And Vomiting  . Adhesive [Tape] Rash    And paper tape causes a rash if wearing for a prolong period of time  . Penicillins Rash    Has patient had a PCN reaction causing immediate rash, facial/tongue/throat swelling, SOB or lightheadedness with hypotension: Yes Has patient had a PCN reaction causing severe rash involving mucus membranes or skin necrosis: No Has patient had a PCN reaction that required hospitalization No Has patient had a PCN reaction occurring within the last 10 years: No If all of the above answers are "NO", then may proceed with Cephalosporin use.    Social History   Socioeconomic History  . Marital status: Single    Spouse name: Not on file  . Number of children: 3  . Years of education: Some college  . Highest education level: Not on file  Occupational History  . Occupation: Production designer, theatre/television/film    Comment: Currently out on disability d/t surg  Social Needs  . Financial resource strain: Not on file  . Food insecurity:    Worry: Not on file    Inability: Not on file  . Transportation needs:    Medical: Not on file    Non-medical: Not on file  Tobacco Use  . Smoking status: Former Smoker    Packs/day: 0.50    Years: 20.00    Pack years: 10.00    Types: Cigarettes    Last attempt to quit: 03/17/2014    Years since quitting: 4.9  . Smokeless tobacco: Never Used  Substance and Sexual Activity  . Alcohol use: No    Alcohol/week: 0.0 standard drinks  . Drug use: No  . Sexual activity: Not Currently    Birth control/protection: Surgical, Abstinence  Lifestyle  . Physical activity:    Days per week: Not on file    Minutes per session: Not on file  . Stress: Not on file  Relationships  . Social connections:    Talks on phone: Not on file    Gets together: Not on file    Attends religious  service: Not on file    Active member of club or organization: Not on file    Attends meetings of clubs or organizations: Not on file    Relationship status: Not on file  . Intimate partner violence:    Fear of current or ex partner: Not on file    Emotionally abused: Not on file    Physically abused: Not on file    Forced sexual activity: Not on file  Other Topics Concern  . Not on file  Social History Narrative   Marital status:  Single; not dating seriously.      Children:  3 sons (29, 49, 72) whose father is deceased, 1 daughter (deceased age 83); no grandchildren      Lives:  With 2 sons      Employment:  Long term disability in 2018 due to PICC line for iv fluids; Corporate treasurer at [employer]      Tobacco: quit June 2015      Alcohol:  None      Drugs:  None      Exercise:  Leg lifts for knee strengthening; walking   1 caffeine beverage daily      Sexual activity:  Active; total partners = up there.  No STDs in past.        Right-handed    Review of Systems  Constitutional: Positive for chills. Negative for fever.  HENT: Positive for sore throat. Negative for congestion and nosebleeds.   Eyes: Negative for discharge and redness.  Respiratory: Positive for cough and shortness of breath (DOE). Negative for hemoptysis, sputum production and wheezing.   Cardiovascular: Negative.  Negative for chest pain, palpitations and leg swelling.  Gastrointestinal: Negative.  Negative for abdominal pain, blood in stool, diarrhea, melena, nausea and vomiting.  Genitourinary: Negative.   Musculoskeletal: Positive for myalgias.  Skin: Negative.  Negative for rash.  Neurological: Negative for dizziness and headaches.  Endo/Heme/Allergies: Negative.   All other systems reviewed and are negative.   Objective   Vitals as reported by the patient: Today's Vitals   02/16/19 1437  Weight: 195 lb (88.5 kg)  Height: 5' 7"  (1.702 m)  Awake and oriented x3 in no apparent respiratory  distress while talking to me on the phone.  Normal speech.  Diagnoses and all orders for this visit:  Viral respiratory infection Comments: Suspected COVID 19 infection   COVID-19 instructions and ED precautions given. Patient seems to be having mild to moderate symptoms but is in no respiratory distress.  I discussed the assessment and treatment plan with the patient. The patient was provided an opportunity to ask questions and all were answered. The patient agreed with the plan and demonstrated an understanding of the instructions.   The patient was advised to call back or seek an in-person evaluation if the symptoms worsen or if the condition fails to improve as anticipated.  I provided 15 minutes of non-face-to-face time during this encounter.  Horald Pollen, MD  Primary Care at [facility]

## 2019-02-16 NOTE — Progress Notes (Signed)
Chief Complaint: pt states X 2-3 days SOB, Headache, Cough, Body pains, and hot and cold chills

## 2019-02-27 ENCOUNTER — Other Ambulatory Visit (INDEPENDENT_AMBULATORY_CARE_PROVIDER_SITE_OTHER): Payer: 59

## 2019-02-27 DIAGNOSIS — D508 Other iron deficiency anemias: Secondary | ICD-10-CM

## 2019-02-27 LAB — CBC WITH DIFFERENTIAL/PLATELET
Basophils Absolute: 0 10*3/uL (ref 0.0–0.1)
Basophils Relative: 0.5 % (ref 0.0–3.0)
Eosinophils Absolute: 0.4 10*3/uL (ref 0.0–0.7)
Eosinophils Relative: 4.8 % (ref 0.0–5.0)
HCT: 33.2 % — ABNORMAL LOW (ref 36.0–46.0)
Hemoglobin: 11.1 g/dL — ABNORMAL LOW (ref 12.0–15.0)
Lymphocytes Relative: 31.5 % (ref 12.0–46.0)
Lymphs Abs: 2.4 10*3/uL (ref 0.7–4.0)
MCHC: 33.5 g/dL (ref 30.0–36.0)
MCV: 90.7 fl (ref 78.0–100.0)
Monocytes Absolute: 0.4 10*3/uL (ref 0.1–1.0)
Monocytes Relative: 5 % (ref 3.0–12.0)
Neutro Abs: 4.4 10*3/uL (ref 1.4–7.7)
Neutrophils Relative %: 58.2 % (ref 43.0–77.0)
Platelets: 323 10*3/uL (ref 150.0–400.0)
RBC: 3.66 Mil/uL — ABNORMAL LOW (ref 3.87–5.11)
RDW: 15.1 % (ref 11.5–15.5)
WBC: 7.6 10*3/uL (ref 4.0–10.5)

## 2019-02-28 ENCOUNTER — Other Ambulatory Visit: Payer: Self-pay

## 2019-02-28 DIAGNOSIS — D508 Other iron deficiency anemias: Secondary | ICD-10-CM

## 2019-02-28 NOTE — Progress Notes (Signed)
Hemoglobin stable  Wanted CBC and ferritin - ferritin was not ordered  Ask her to recheck with CBC and ferritin in 2 weeks

## 2019-03-01 ENCOUNTER — Ambulatory Visit (INDEPENDENT_AMBULATORY_CARE_PROVIDER_SITE_OTHER): Payer: 59 | Admitting: Neurology

## 2019-03-01 ENCOUNTER — Other Ambulatory Visit: Payer: Self-pay

## 2019-03-01 ENCOUNTER — Encounter: Payer: Self-pay | Admitting: Neurology

## 2019-03-01 DIAGNOSIS — G629 Polyneuropathy, unspecified: Secondary | ICD-10-CM

## 2019-03-01 DIAGNOSIS — G5601 Carpal tunnel syndrome, right upper limb: Secondary | ICD-10-CM

## 2019-03-01 DIAGNOSIS — R202 Paresthesia of skin: Secondary | ICD-10-CM

## 2019-03-01 DIAGNOSIS — G609 Hereditary and idiopathic neuropathy, unspecified: Secondary | ICD-10-CM

## 2019-03-01 HISTORY — DX: Carpal tunnel syndrome, right upper limb: G56.01

## 2019-03-01 HISTORY — DX: Polyneuropathy, unspecified: G62.9

## 2019-03-01 MED ORDER — BACLOFEN 10 MG PO TABS: 5.0000 mg | ORAL_TABLET | Freq: Every day | ORAL | 1 refills | Status: DC

## 2019-03-01 NOTE — Procedures (Signed)
     HISTORY:  [name] is a 43 year old patient with a history of a mild peripheral neuropathy that came on following a massive weight loss after gastric bypass procedure.  Her last nerve conduction study of the legs was in 2016.  The patient reports increasing paresthesias below the knees bilaterally and the studies being repleted.  She recently was found to have carpal tunnel syndrome on nerve conduction studies done in December 2019 on the right hand.  She will be having carpal tunnel surgery in the near future along with a right rotator cuff tear repair.   NERVE CONDUCTION STUDIES:  Nerve conduction studies were performed on both lower extremities.  The distal motor latencies for the peroneal and posterior tibial nerves were normal bilaterally with normal motor amplitudes seen for the peroneal nerves bilaterally, low for the posterior tibial nerves bilaterally.  Slowing was seen for the posterior tibial nerves bilaterally and for the left peroneal nerve, normal for the right peroneal nerve.  The sensory latencies for the sural and peroneal nerves were within normal limits bilaterally.  The F-wave latencies for the posterior tibial nerves were prolonged bilaterally.  EMG STUDIES:  EMG study was performed on the left lower extremity:  The tibialis anterior muscle reveals 2 to 5K motor units with decreased recruitment. No fibrillations or positive waves were seen. The peroneus tertius muscle reveals 2 to 5K motor units with decreased recruitment. No fibrillations or positive waves were seen. The medial gastrocnemius muscle reveals 1 to 4K motor units with decreased recruitment. No fibrillations or positive waves were seen. The vastus lateralis muscle reveals 2 to 4K motor units with full recruitment. No fibrillations or positive waves were seen. The iliopsoas muscle reveals 2 to 4K motor units with full recruitment. No fibrillations or positive waves were seen. The biceps femoris muscle  (long head) reveals 2 to 4K motor units with full recruitment. No fibrillations or positive waves were seen. The lumbosacral paraspinal muscles were tested at 3 levels, and revealed no abnormalities of insertional activity at all 3 levels tested. There was good relaxation.   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  When compared to the prior study done in 2016, there appears to be improvement in nerve function in the legs on today's study.  EMG evaluation of the left lower extremity shows chronic stable neuropathic signs of denervation below the knee consistent with a diagnosis of peripheral neuropathy.  No clear evidence of an overlying lumbosacral radiculopathy was noted.  Jill Alexanders MD 03/01/2019 4:11 PM  [facility] [address] [place], [place] [address]  Phone [phone] Fax [phone]

## 2019-03-01 NOTE — Progress Notes (Signed)
Please refer to EMG and nerve conduction procedure note.  

## 2019-03-01 NOTE — Progress Notes (Signed)
The patient comes in for EMG and nerve conduction studies today.  Nerve conductions today show improvement over prior study done in 2016, but the patient still has a mild primarily motor peripheral neuropathy.  The sensory latencies have improved.  The patient has an EMG of the left leg showing distal chronic denervation consistent with a peripheral neuropathy.  The patient is having frequent nocturnal leg cramps, a prescription for baclofen will be sent in for this purpose.  She will call for any dose adjustments.

## 2019-03-30 ENCOUNTER — Telehealth: Payer: Self-pay | Admitting: General Practice

## 2019-03-30 ENCOUNTER — Telehealth: Payer: Self-pay | Admitting: Family Medicine

## 2019-03-30 DIAGNOSIS — B379 Candidiasis, unspecified: Secondary | ICD-10-CM

## 2019-03-30 MED ORDER — FLUCONAZOLE 150 MG PO TABS: ORAL_TABLET | ORAL | 0 refills | Status: DC

## 2019-03-30 NOTE — Telephone Encounter (Signed)
Called patient regarding mychart message and asked about her symptoms. Patient states she knows she has a yeast infection because she gets them so frequently because she has a lot of extra skin from weight loss. Patient states it is always worse with the heat. Patient states Dr Kennon Rounds usually gives her refills but she is currently out. Patient reports vaginal itching & thick discharge. Patient states she normally needs 2 doses and Dr Kennon Rounds told her she could take it 24 hours apart if needed rather than in 3 days. Rx ordered for patient and patient informed. Told patient we would cancel her Monday appt. Patient verbalized understanding to all & had no questions.

## 2019-03-30 NOTE — Telephone Encounter (Signed)
Spoke with patient to get her scheduled for a self swab appointment per Elmyra Ricks. Patient was scheduled for 7/6 @ 8:30. Patient was instructed to wear a face mask and no visitors are allowed with her. Patient was screened for covid symptoms and denied having any

## 2019-04-03 ENCOUNTER — Ambulatory Visit: Payer: 59

## 2019-04-07 ENCOUNTER — Emergency Department (HOSPITAL_COMMUNITY)
Admission: EM | Admit: 2019-04-07 | Discharge: 2019-04-08 | Disposition: A | Discharge status: Other Acute Inpatient Hospital | Payer: 59 | Source: Home / Self Care | Attending: Emergency Medicine | Admitting: Emergency Medicine

## 2019-04-07 DIAGNOSIS — Z03818 Encounter for observation for suspected exposure to other biological agents ruled out: Secondary | ICD-10-CM | POA: Insufficient documentation

## 2019-04-07 DIAGNOSIS — Z789 Other specified health status: Secondary | ICD-10-CM

## 2019-04-07 DIAGNOSIS — F1010 Alcohol abuse, uncomplicated: Secondary | ICD-10-CM | POA: Insufficient documentation

## 2019-04-07 DIAGNOSIS — Z7289 Other problems related to lifestyle: Secondary | ICD-10-CM

## 2019-04-07 DIAGNOSIS — F17210 Nicotine dependence, cigarettes, uncomplicated: Secondary | ICD-10-CM | POA: Insufficient documentation

## 2019-04-07 DIAGNOSIS — R45851 Suicidal ideations: Secondary | ICD-10-CM

## 2019-04-07 DIAGNOSIS — J45909 Unspecified asthma, uncomplicated: Secondary | ICD-10-CM | POA: Insufficient documentation

## 2019-04-07 DIAGNOSIS — Z880 Allergy status to penicillin: Secondary | ICD-10-CM | POA: Insufficient documentation

## 2019-04-07 DIAGNOSIS — F332 Major depressive disorder, recurrent severe without psychotic features: Secondary | ICD-10-CM | POA: Insufficient documentation

## 2019-04-07 DIAGNOSIS — Z9884 Bariatric surgery status: Secondary | ICD-10-CM | POA: Insufficient documentation

## 2019-04-08 ENCOUNTER — Encounter (HOSPITAL_COMMUNITY): Payer: Self-pay | Admitting: *Deleted

## 2019-04-08 ENCOUNTER — Inpatient Hospital Stay (HOSPITAL_COMMUNITY)
Admission: AD | Admit: 2019-04-08 | Discharge: 2019-04-10 | DRG: 885 | Disposition: A | Discharge status: Home / Self Care | Payer: 59 | Attending: Psychiatry | Admitting: Psychiatry

## 2019-04-08 ENCOUNTER — Emergency Department (HOSPITAL_COMMUNITY): Payer: 59

## 2019-04-08 ENCOUNTER — Other Ambulatory Visit: Payer: Self-pay

## 2019-04-08 DIAGNOSIS — Z885 Allergy status to narcotic agent status: Secondary | ICD-10-CM | POA: Diagnosis not present

## 2019-04-08 DIAGNOSIS — F10129 Alcohol abuse with intoxication, unspecified: Secondary | ICD-10-CM | POA: Diagnosis present

## 2019-04-08 DIAGNOSIS — Z87891 Personal history of nicotine dependence: Secondary | ICD-10-CM

## 2019-04-08 DIAGNOSIS — E669 Obesity, unspecified: Secondary | ICD-10-CM | POA: Diagnosis present

## 2019-04-08 DIAGNOSIS — G629 Polyneuropathy, unspecified: Secondary | ICD-10-CM | POA: Diagnosis present

## 2019-04-08 DIAGNOSIS — K219 Gastro-esophageal reflux disease without esophagitis: Secondary | ICD-10-CM | POA: Diagnosis present

## 2019-04-08 DIAGNOSIS — Z79899 Other long term (current) drug therapy: Secondary | ICD-10-CM

## 2019-04-08 DIAGNOSIS — Z8711 Personal history of peptic ulcer disease: Secondary | ICD-10-CM

## 2019-04-08 DIAGNOSIS — Z6830 Body mass index (BMI) 30.0-30.9, adult: Secondary | ICD-10-CM

## 2019-04-08 DIAGNOSIS — Z8719 Personal history of other diseases of the digestive system: Secondary | ICD-10-CM

## 2019-04-08 DIAGNOSIS — Z8249 Family history of ischemic heart disease and other diseases of the circulatory system: Secondary | ICD-10-CM | POA: Diagnosis not present

## 2019-04-08 DIAGNOSIS — Z79891 Long term (current) use of opiate analgesic: Secondary | ICD-10-CM

## 2019-04-08 DIAGNOSIS — Z91018 Allergy to other foods: Secondary | ICD-10-CM

## 2019-04-08 DIAGNOSIS — Z825 Family history of asthma and other chronic lower respiratory diseases: Secondary | ICD-10-CM | POA: Diagnosis not present

## 2019-04-08 DIAGNOSIS — Z880 Allergy status to penicillin: Secondary | ICD-10-CM

## 2019-04-08 DIAGNOSIS — Z833 Family history of diabetes mellitus: Secondary | ICD-10-CM | POA: Diagnosis not present

## 2019-04-08 DIAGNOSIS — Z96652 Presence of left artificial knee joint: Secondary | ICD-10-CM | POA: Diagnosis present

## 2019-04-08 DIAGNOSIS — Z1159 Encounter for screening for other viral diseases: Secondary | ICD-10-CM | POA: Diagnosis not present

## 2019-04-08 DIAGNOSIS — G43909 Migraine, unspecified, not intractable, without status migrainosus: Secondary | ICD-10-CM | POA: Diagnosis present

## 2019-04-08 DIAGNOSIS — G8929 Other chronic pain: Secondary | ICD-10-CM | POA: Diagnosis present

## 2019-04-08 DIAGNOSIS — Y906 Blood alcohol level of 120-199 mg/100 ml: Secondary | ICD-10-CM | POA: Diagnosis present

## 2019-04-08 DIAGNOSIS — F332 Major depressive disorder, recurrent severe without psychotic features: Secondary | ICD-10-CM | POA: Diagnosis present

## 2019-04-08 DIAGNOSIS — Z87440 Personal history of urinary (tract) infections: Secondary | ICD-10-CM

## 2019-04-08 DIAGNOSIS — F419 Anxiety disorder, unspecified: Secondary | ICD-10-CM | POA: Diagnosis present

## 2019-04-08 DIAGNOSIS — J45909 Unspecified asthma, uncomplicated: Secondary | ICD-10-CM | POA: Diagnosis present

## 2019-04-08 DIAGNOSIS — R45851 Suicidal ideations: Secondary | ICD-10-CM

## 2019-04-08 DIAGNOSIS — Z803 Family history of malignant neoplasm of breast: Secondary | ICD-10-CM

## 2019-04-08 DIAGNOSIS — Z91048 Other nonmedicinal substance allergy status: Secondary | ICD-10-CM | POA: Diagnosis not present

## 2019-04-08 LAB — I-STAT CHEM 8, ED
BUN: 3 mg/dL — ABNORMAL LOW (ref 6–20)
Calcium, Ion: 1.14 mmol/L — ABNORMAL LOW (ref 1.15–1.40)
Chloride: 107 mmol/L (ref 98–111)
Creatinine, Ser: 0.6 mg/dL (ref 0.44–1.00)
Glucose, Bld: 78 mg/dL (ref 70–99)
HCT: 35 % — ABNORMAL LOW (ref 36.0–46.0)
Hemoglobin: 11.9 g/dL — ABNORMAL LOW (ref 12.0–15.0)
Potassium: 4.3 mmol/L (ref 3.5–5.1)
Sodium: 139 mmol/L (ref 135–145)
TCO2: 22 mmol/L (ref 22–32)

## 2019-04-08 LAB — I-STAT BETA HCG BLOOD, ED (MC, WL, AP ONLY): I-stat hCG, quantitative: <5 m[IU]/mL (ref <5)

## 2019-04-08 LAB — COMPREHENSIVE METABOLIC PANEL
ALT: 17 U/L (ref 0–44)
AST: 17 U/L (ref 15–41)
Albumin: 3.7 g/dL (ref 3.5–5.0)
Alkaline Phosphatase: 66 U/L (ref 38–126)
Anion gap: 11 (ref 5–15)
BUN: 8 mg/dL (ref 6–20)
CO2: 18 mmol/L — ABNORMAL LOW (ref 22–32)
Calcium: 8.7 mg/dL — ABNORMAL LOW (ref 8.9–10.3)
Chloride: 108 mmol/L (ref 98–111)
Creatinine, Ser: 0.7 mg/dL (ref 0.44–1.00)
GFR calc Af Amer: >60 mL/min (ref >60)
GFR calc non Af Amer: >60 mL/min (ref >60)
Glucose, Bld: 81 mg/dL (ref 70–99)
Potassium: 2.9 mmol/L — ABNORMAL LOW (ref 3.5–5.1)
Sodium: 137 mmol/L (ref 135–145)
Total Bilirubin: 0.1 mg/dL — ABNORMAL LOW (ref 0.3–1.2)
Total Protein: 7 g/dL (ref 6.5–8.1)

## 2019-04-08 LAB — RAPID URINE DRUG SCREEN, HOSP PERFORMED
Amphetamines: NOT DETECTED
Barbiturates: NOT DETECTED
Benzodiazepines: POSITIVE — AB
Cocaine: NOT DETECTED
Opiates: NOT DETECTED
Tetrahydrocannabinol: NOT DETECTED

## 2019-04-08 LAB — CBC
HCT: 37.4 % (ref 36.0–46.0)
Hemoglobin: 11.7 g/dL — ABNORMAL LOW (ref 12.0–15.0)
MCH: 30.1 pg (ref 26.0–34.0)
MCHC: 31.3 g/dL (ref 30.0–36.0)
MCV: 96.1 fL (ref 80.0–100.0)
Platelets: 281 10*3/uL (ref 150–400)
RBC: 3.89 MIL/uL (ref 3.87–5.11)
RDW: 14.1 % (ref 11.5–15.5)
WBC: 8.2 10*3/uL (ref 4.0–10.5)
nRBC: 0 % (ref 0.0–0.2)

## 2019-04-08 LAB — SARS CORONAVIRUS 2 BY RT PCR (HOSPITAL ORDER, PERFORMED IN ~~LOC~~ HOSPITAL LAB): SARS Coronavirus 2: NEGATIVE

## 2019-04-08 LAB — SALICYLATE LEVEL: Salicylate Lvl: <7 mg/dL (ref 2.8–30.0)

## 2019-04-08 LAB — CBG MONITORING, ED: Glucose-Capillary: 80 mg/dL (ref 70–99)

## 2019-04-08 LAB — ETHANOL: Alcohol, Ethyl (B): 122 mg/dL — ABNORMAL HIGH (ref <10)

## 2019-04-08 LAB — ACETAMINOPHEN LEVEL: Acetaminophen (Tylenol), Serum: <10 ug/mL — ABNORMAL LOW (ref 10–30)

## 2019-04-08 MED ORDER — DULOXETINE HCL 60 MG PO CPEP
60.0000 mg | ORAL_CAPSULE | Freq: Two times a day (BID) | ORAL | Status: DC
  Administered 2019-04-08 – 2019-04-10 (×4): 60 mg via ORAL
  Filled 2019-04-08 (×7): qty 1

## 2019-04-08 MED ORDER — GABAPENTIN 100 MG PO CAPS
100.0000 mg | ORAL_CAPSULE | Freq: Three times a day (TID) | ORAL | Status: DC
  Administered 2019-04-08 – 2019-04-10 (×5): 100 mg via ORAL
  Filled 2019-04-08 (×9): qty 1

## 2019-04-08 MED ORDER — ALBUTEROL SULFATE HFA 108 (90 BASE) MCG/ACT IN AERS: 2.0000 | INHALATION_SPRAY | Freq: Four times a day (QID) | RESPIRATORY_TRACT | Status: DC | PRN

## 2019-04-08 MED ORDER — PEG 3350-KCL-NA BICARB-NACL 420 G PO SOLR
4000.0000 mL | Freq: Once | ORAL | Status: AC
  Administered 2019-04-08: 4000 mL via ORAL
  Filled 2019-04-08: qty 4000

## 2019-04-08 MED ORDER — LORAZEPAM 1 MG PO TABS: 1.0000 mg | ORAL_TABLET | Freq: Every day | ORAL | Status: DC

## 2019-04-08 MED ORDER — HYDROCODONE-ACETAMINOPHEN 10-325 MG PO TABS: 1.0000 | ORAL_TABLET | Freq: Four times a day (QID) | ORAL | Status: DC | PRN

## 2019-04-08 MED ORDER — ALUM & MAG HYDROXIDE-SIMETH 200-200-20 MG/5ML PO SUSP: 30.0000 mL | ORAL | Status: DC | PRN

## 2019-04-08 MED ORDER — VITAMIN B-1 100 MG PO TABS
100.0000 mg | ORAL_TABLET | Freq: Every day | ORAL | Status: DC
  Administered 2019-04-08 – 2019-04-10 (×4): 100 mg via ORAL
  Filled 2019-04-08 (×3): qty 1

## 2019-04-08 MED ORDER — POTASSIUM CHLORIDE 10 MEQ/100ML IV SOLN
10.0000 meq | INTRAVENOUS | Status: AC
  Administered 2019-04-08 (×3): 10 meq via INTRAVENOUS
  Filled 2019-04-08 (×3): qty 100

## 2019-04-08 MED ORDER — POTASSIUM CHLORIDE CRYS ER 20 MEQ PO TBCR: 20.0000 meq | EXTENDED_RELEASE_TABLET | Freq: Two times a day (BID) | ORAL | Status: DC

## 2019-04-08 MED ORDER — HYDROXYZINE HCL 25 MG PO TABS
25.0000 mg | ORAL_TABLET | Freq: Four times a day (QID) | ORAL | Status: DC | PRN
  Administered 2019-04-09: 25 mg via ORAL
  Filled 2019-04-08 (×2): qty 1

## 2019-04-08 MED ORDER — FLUOXETINE HCL 20 MG PO CAPS: 40.0000 mg | ORAL_CAPSULE | Freq: Every day | ORAL | Status: DC

## 2019-04-08 MED ORDER — LORAZEPAM 1 MG PO TABS
1.0000 mg | ORAL_TABLET | Freq: Four times a day (QID) | ORAL | Status: DC
  Administered 2019-04-08: 1 mg via ORAL
  Filled 2019-04-08: qty 1

## 2019-04-08 MED ORDER — MAGNESIUM HYDROXIDE 400 MG/5ML PO SUSP: 30.0000 mL | Freq: Every day | ORAL | Status: DC | PRN

## 2019-04-08 MED ORDER — VITAMIN B-1 100 MG PO TABS: 100.0000 mg | ORAL_TABLET | Freq: Every day | ORAL | Status: DC

## 2019-04-08 MED ORDER — THIAMINE HCL 100 MG/ML IJ SOLN: 100.0000 mg | Freq: Once | INTRAMUSCULAR | Status: DC

## 2019-04-08 MED ORDER — TRAZODONE HCL 50 MG PO TABS
50.0000 mg | ORAL_TABLET | Freq: Every evening | ORAL | Status: DC | PRN
  Administered 2019-04-09: 50 mg via ORAL
  Filled 2019-04-08 (×2): qty 1

## 2019-04-08 MED ORDER — PANTOPRAZOLE SODIUM 40 MG PO TBEC
40.0000 mg | DELAYED_RELEASE_TABLET | Freq: Two times a day (BID) | ORAL | Status: DC
  Administered 2019-04-08: 40 mg via ORAL
  Filled 2019-04-08: qty 1

## 2019-04-08 MED ORDER — LORAZEPAM 1 MG PO TABS: 1.0000 mg | ORAL_TABLET | Freq: Three times a day (TID) | ORAL | Status: DC

## 2019-04-08 MED ORDER — ACETAMINOPHEN 325 MG PO TABS
650.0000 mg | ORAL_TABLET | Freq: Four times a day (QID) | ORAL | Status: DC | PRN
  Administered 2019-04-08 – 2019-04-09 (×2): 650 mg via ORAL
  Filled 2019-04-08 (×2): qty 2

## 2019-04-08 MED ORDER — LORAZEPAM 1 MG PO TABS: 1.0000 mg | ORAL_TABLET | Freq: Two times a day (BID) | ORAL | Status: DC

## 2019-04-08 MED ORDER — ADULT MULTIVITAMIN W/MINERALS CH
1.0000 | ORAL_TABLET | Freq: Every day | ORAL | Status: DC
  Administered 2019-04-08 – 2019-04-10 (×3): 1 via ORAL
  Filled 2019-04-08 (×4): qty 1

## 2019-04-08 MED ORDER — LOPERAMIDE HCL 2 MG PO CAPS: 2.0000 mg | ORAL_CAPSULE | ORAL | Status: DC | PRN

## 2019-04-08 MED ORDER — ONDANSETRON 4 MG PO TBDP
4.0000 mg | ORAL_TABLET | Freq: Four times a day (QID) | ORAL | Status: DC | PRN
  Administered 2019-04-09 – 2019-04-10 (×2): 4 mg via ORAL
  Filled 2019-04-08 (×2): qty 1

## 2019-04-08 MED ORDER — LORAZEPAM 1 MG PO TABS: 1.0000 mg | ORAL_TABLET | Freq: Four times a day (QID) | ORAL | Status: DC | PRN

## 2019-04-08 NOTE — Progress Notes (Signed)
[place] NOVEL CORONAVIRUS (COVID-19) DAILY CHECK-OFF SYMPTOMS - answer yes or no to each - every day NO YES  Have you had a fever in the past 24 hours?  . Fever (Temp > 37.80C / 100F) X   Have you had any of these symptoms in the past 24 hours? . New Cough .  Sore Throat  .  Shortness of Breath .  Difficulty Breathing .  Unexplained Body Aches   X   Have you had any one of these symptoms in the past 24 hours not related to allergies?   . Runny Nose .  Nasal Congestion .  Sneezing   X   If you have had runny nose, nasal congestion, sneezing in the past 24 hours, has it worsened?  X   EXPOSURES - check yes or no X   Have you traveled outside the state in the past 14 days?  X   Have you been in contact with someone with a confirmed diagnosis of COVID-19 or PUI in the past 14 days without wearing appropriate PPE?  X   Have you been living in the same home as a person with confirmed diagnosis of COVID-19 or a PUI (household contact)?    X   Have you been diagnosed with COVID-19?    X              What to do next: Answered NO to all: Answered YES to anything:   Proceed with unit schedule Follow the [facility] Inpatient Flowsheet.   

## 2019-04-08 NOTE — ED Notes (Signed)
Pt expressing remorse for actions. When told that staff was concerned for her safety, pt stated "I'm sorry, I'm sober now, I'm fine." Assisted pt in repositioning in bed. Pt now resting peacefully.

## 2019-04-08 NOTE — ED Notes (Signed)
Police here and she is transported without incident.

## 2019-04-08 NOTE — ED Provider Notes (Addendum)
[facility] DEPT Provider Note   CSN: [id number] Arrival date & time: 04/07/19  2352    History   Chief Complaint Chief Complaint  Patient presents with  . Drug Overdose  . Suicidal    HPI [name] is a 43 y.o. female presenting for suicidal thoughts.  Level 5 caveat due to altered mental status and psychiatric condition.  Per RN, patient had sent text to her family stating that she wanted to die.  When [employer] showed up, patient was on the lawn with a knife threatening to stab herself.  She also had pill bottles around her, unknown if she took any medications.  [employer] saw patient swallowed a bag of what appeared to be marijuana.  Patient was combative, given Versed and Haldol with EMS.  Upon my evaluation, she is responsive only to pain.   Additional history obtained per chart review, patient with a history of depression, anemia, status post lap sleeve gastrectomy.     HPI  Past Medical History:  Diagnosis Date  . Anal fissure - posterior 10/16/2014    OCC  ISSUES  . Anxiety    doesn't take anything  . Asthma    has Albuterol inhaler as needed  . Boil    on pubic area started septra 03-01-17 draining blood and pus  . Bronchitis   . Chronic headache disorder 07/22/2016  . Clostridium difficile infection 04/20/2012  . Colitis   . Dehydration   . Depression   . Family history of adverse reaction to anesthesia    pt mom gets sick  . GERD (gastroesophageal reflux disease)    takes Pantoprazole daily  . Headache   . Hiatal hernia    neuropathy - mild in arms and legs  . History of blood transfusion    last transfusion was 04/04/2016=Benadryl was given d/t itching. States she always itches with transfusion.   Marland Kitchen History of bronchitis    > 2 yrs ago  . History of colon polyps    benign  . History of migraine    last one 05/01/16  . History of stomach ulcers   . History of urinary tract infection LAST 2 WEEKS AGO  . IDA (iron deficiency  anemia)   . Internal and external bleeding hemorrhoids 06/11/2014  . Joint pain   . Joint swelling   . Left sided chronic colitis - segmental 06/11/2014  . Marginal ulcer 10/25/2017  . Migraine   . Motion sickness   . Nausea    takes Zofran as needed  . Nausea and vomiting    for 1 year  . Obesity   . Oligouria   . Osteoarthritis    lower back, knees, wrists - no meds  . Peripheral neuropathy 03/01/2019  . Pneumonia 1997  . Postoperative nausea and vomiting 01/21/2016   wants scopolamine patch  . Right carpal tunnel syndrome 03/01/2019  . SVD (spontaneous vaginal delivery)    x 4  . Tingling    BOTH LOWER EXTREMETIES ALL THE TIME DUE TO RAPID WEIGHT LOSS  . TOBACCO USER 10/02/2009   Qualifier: Diagnosis of  By: Dimas Millin MD, Ellard Artis    . Transfusion history    last transfusion 6'16   . Tremor 08/31/2018  . UC (ulcerative colitis) ([place])    supposed to be taking Lialda and Bentyl but has been off since gastric sleeve  . Vertigo    doesn't take any meds    Patient Active Problem List   Diagnosis Date  Noted  . Peripheral neuropathy 03/01/2019  . Right carpal tunnel syndrome 03/01/2019  . Chronic constipation 01/26/2019  . 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 B12 deficiency 12/01/2017  . Malnutrition, calorie ([place]) 03/27/2017  . Psychophysiological insomnia 03/18/2017  . Chronic headache disorder 07/22/2016  . Memory difficulty 07/22/2016  . 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 Anemia, iron deficiency 10/01/2015  . Left sided chronic colitis - segmental 06/11/2014  . Asthma 04/26/2012  . Reactive depression 10/02/2009    Past Surgical History:  Procedure Laterality Date  . ABDOMINAL HYSTERECTOMY     PARTIAL  . COLONOSCOPY  2007   for rectal bleeding; [facility]  . COLONOSCOPY N/A 06/11/2014   Procedure: COLONOSCOPY;  Surgeon: Gatha Mayer, MD;  Location: [facility];  Service: Endoscopy;  Laterality: N/A;  . COLONOSCOPY WITH PROPOFOL N/A 03/02/2017   Procedure: COLONOSCOPY  WITH PROPOFOL;  Surgeon: Alphonsa Overall, MD;  Location: [facility];  Service: General;  Laterality: N/A;  . DILATION AND CURETTAGE OF UTERUS    . ESOPHAGOGASTRODUODENOSCOPY (EGD) WITH PROPOFOL N/A 04/06/2016   Procedure: ESOPHAGOGASTRODUODENOSCOPY (EGD) WITH PROPOFOL;  Surgeon: Alphonsa Overall, MD;  Location: [facility];  Service: General;  Laterality: N/A;  . ESOPHAGOGASTRODUODENOSCOPY (EGD) WITH PROPOFOL N/A 03/02/2017   Procedure: ESOPHAGOGASTRODUODENOSCOPY (EGD) WITH PROPOFOL;  Surgeon: Alphonsa Overall, MD;  Location: [facility];  Service: General;  Laterality: N/A;  . ESOPHAGOGASTRODUODENOSCOPY (EGD) WITH PROPOFOL N/A 05/20/2017   Procedure: ESOPHAGOGASTRODUODENOSCOPY (EGD) WITH PROPOFOL ERAS PATHWAY;  Surgeon: Alphonsa Overall, MD;  Location: [facility];  Service: General;  Laterality: N/A;  . ESOPHAGOGASTRODUODENOSCOPY (EGD) WITH PROPOFOL N/A 10/07/2017   Procedure: ESOPHAGOGASTRODUODENOSCOPY (EGD) WITH PROPOFOL;  Surgeon: Alphonsa Overall, MD;  Location: [facility];  Service: General;  Laterality: N/A;  . EXCISION OF SKIN TAG  06/08/2017   Procedure: EXCISION OF VULVAR SKIN TAGS X2;  Surgeon: [name], MD;  Location: [facility];  Service: Gynecology;;  . FOOT SURGERY Bilateral    x 2  . GASTRIC ROUX-EN-Y N/A 12/14/2016   Procedure: LAPAROSCOPIC REVISION SLEEVE GASTRECTOMY TO  ROUX-Y-GASTRIC BY-PASS, UPPER ENDO;  Surgeon: Excell Seltzer, MD;  Location: [facility];  Service: General;  Laterality: N/A;  . GASTROJEJUNOSTOMY N/A 05/11/2016   Procedure: LAPAROSCOPIC PLACEMENT  OF FEEDING  JEJUNOSTOMY TUBE;  Surgeon: Excell Seltzer, MD;  Location: [facility];  Service: General;  Laterality: N/A;  . HEMORRHOIDECTOMY WITH HEMORRHOID BANDING    . IR FLUORO GUIDE CV LINE RIGHT  04/06/2017  . IR GENERIC HISTORICAL  05/19/2016   IR CM INJ ANY COLONIC TUBE W/FLUORO 05/19/2016 Aletta Edouard, MD [facility]  . IR PATIENT EVAL TECH 0-60 MINS  11/30/2017  . IR REPLC DUODEN/JEJUNO TUBE PERCUT W/FLUORO  03/14/2018  .  IR REPLC DUODEN/JEJUNO TUBE PERCUT W/FLUORO  03/15/2018  . IR US GUIDE VASC ACCESS RIGHT  04/06/2017  . j tube removed march 2018    . KNEE ARTHROSCOPY Left 09/06/2014  . LAPAROSCOPIC GASTRIC SLEEVE RESECTION N/A 01/14/2016   Procedure: LAPAROSCOPIC GASTRIC SLEEVE RESECTION;  Surgeon: Excell Seltzer, MD;  Location: [facility];  Service: General;  Laterality: N/A;  . LAPAROSCOPIC REVISION OF GASTROJEJUNOSTOMY N/A 10/25/2017   Procedure: LAPAROSCOPIC REVISION OF GASTROJEJUNOSTOMY AND PARTIAL GASTRECTOMY, WITH PLACEMENT OF FEEDING GASTROSTOMY TUBE;  Surgeon: Excell Seltzer, MD;  Location: [facility];  Service: General;  Laterality: N/A;  . LAPAROSCOPIC TUBAL LIGATION  10/16/2011   Procedure: LAPAROSCOPIC TUBAL LIGATION;  Surgeon: Alwyn Pea, MD;  Location: [facility];  Service: Gynecology;  Laterality: Bilateral;  . NOVASURE  ABLATION  09/28/2010   mild persistent vaginal bleeding  . right knee arthroscopy     05/07/2016  . TOTAL KNEE ARTHROPLASTY Left 03/10/2018   Procedure: LEFT TOTAL KNEE ARTHROPLASTY;  Surgeon: Paralee Cancel, MD;  Location: [facility];  Service: Orthopedics;  Laterality: Left;  70 mins  . TUBAL LIGATION    . VAGINAL HYSTERECTOMY Bilateral 06/08/2017   Procedure: HYSTERECTOMY VAGINAL W/ BILATERAL SALPINGECTOMY;  Surgeon: [name], MD;  Location: [facility];  Service: Gynecology;  Laterality: Bilateral;  . wisdom teeth extracted       OB History    Gravida  4   Para  4   Term  4   Preterm      AB      Living  3     SAB      TAB      Ectopic      Multiple      Live Births               Home Medications    Prior to Admission medications   Medication Sig Start Date End Date Taking? Authorizing Provider  albuterol (PROVENTIL HFA;VENTOLIN HFA) 108 (90 Base) MCG/ACT inhaler Inhale 2 puffs into the lungs every 6 (six) hours as needed for wheezing or shortness of breath. For shortness of breath 07/20/18   Forrest Moron, MD  alum & mag hydroxide-simeth  (MAALOX/MYLANTA) 200-200-20 MG/5ML suspension Take 30 mLs by mouth every 6 (six) hours as needed for indigestion or heartburn. Mix with 10 ml of lidocaine Patient not taking: Reported on 02/16/2019 01/16/19   Rutherford Guys, MD  baclofen (LIORESAL) 10 MG tablet Take 0.5 tablets (5 mg total) by mouth at bedtime. 03/01/19   Kathrynn Ducking, MD  CALCIUM CITRATE PO 10 mLs by Feeding Tube route every other day. In the afternoon.    [provider]  docusate sodium (COLACE) 100 MG capsule Take 1 capsule (100 mg total) by mouth 2 (two) times daily. 03/11/18   Danae Orleans, PA-C  DULoxetine HCl 60 MG CSDR Take 60 mg by mouth 2 (two) times a day.    [provider]  ferrous sulfate (FERROUSUL) 325 (65 FE) MG tablet Take 1 tablet (325 mg total) by mouth daily with breakfast. 05/24/18   Rutherford Guys, MD  fluconazole (DIFLUCAN) 150 MG tablet 1 tablet by mouth. May repeat in 24 hours if needed 03/30/19   [name], MD  FLUoxetine (PROZAC) 20 MG/5ML solution Place 10 mLs (40 mg total) into feeding tube daily. 05/24/18   Rutherford Guys, MD  gabapentin (NEURONTIN) 300 MG capsule Take 1 capsule (300 mg total) by mouth 2 (two) times daily AND 2 capsules (600 mg total) at bedtime. Patient taking differently: Take 1 capsule (300 mg total) by mouth 3 times daily AND 2 capsules (600 mg total) at bedtime. 05/24/18   Rutherford Guys, MD  HYDROcodone-acetaminophen [facility]) 10-325 MG tablet Take 1 tablet by mouth every 6 (six) hours as needed.    [provider]  lidocaine (XYLOCAINE) 2 % solution Use as directed 10 mLs in the mouth or throat every 6 (six) hours as needed for mouth pain. Mix with 30 ml of mylanta Patient not taking: Reported on 02/16/2019 01/16/19   Rutherford Guys, MD  linaclotide [facility]) 290 MCG CAPS capsule Take 290 mcg by mouth daily before breakfast.    [provider]  methocarbamol (ROBAXIN) 500 MG tablet Take 1 tablet (500 mg  total) by mouth every 6 (six)  hours as needed for muscle spasms. 03/15/18   Danae Orleans, PA-C  metoCLOPramide (REGLAN) 10 MG tablet Take 1 tablet (10 mg total) by mouth 3 (three) times daily before meals. 01/26/19   Gatha Mayer, MD  Multiple Vitamin (MULTIVITAMIN) LIQD Place 10 mLs into feeding tube daily. Bariatric Liquid Multivitamin     [provider]  nitrofurantoin, macrocrystal-monohydrate, (MACROBID) 100 MG capsule Take 1 capsule (100 mg total) by mouth 2 (two) times daily. 11/29/18   Starr Lake, CNM  nortriptyline (PAMELOR) 10 MG capsule START TAKING 1 TABLET AT BEDTIME X 1 WEEK, THEN TAKE 2 TABLETS AT BEDTIME X 1 WEEK, THEN CAN TAKE 3 TABLETS AT BEDTIME 01/30/19   Suzzanne Cloud, NP  Nutritional Supplements (FEEDING SUPPLEMENT, OSMOLITE 1.2 CAL,) LIQD Place 1,000 mLs into feeding tube continuous.    [provider]  nystatin (MYCOSTATIN) 100000 UNIT/ML suspension Take 5 mLs (500,000 Units total) by mouth 4 (four) times daily. Swish and swallow 01/16/19   Rutherford Guys, MD  ondansetron (ZOFRAN-ODT) 8 MG disintegrating tablet Take 1 tablet (8 mg total) by mouth every 8 (eight) hours as needed for nausea. 07/18/18   Rutherford Guys, MD  pantoprazole (PROTONIX) 40 MG tablet Take 1 tablet (40 mg total) by mouth 2 (two) times daily before a meal. 01/26/19   Gatha Mayer, MD  polyethylene glycol (MIRALAX / Floria Raveling) packet Take 17 g by mouth 2 (two) times daily. 03/11/18   Danae Orleans, PA-C  thiamine (VITAMIN B-1) 100 MG tablet Take 100 mg by mouth daily at 12 noon.     [provider]  Vitamin D, Ergocalciferol, (DRISDOL) 50000 units CAPS capsule Take 1 capsule (50,000 Units total) by mouth every 7 (seven) days. 07/18/18   Rutherford Guys, MD    Family History Family History  Problem Relation Age of Onset  . Hypertension Mother   . Diabetes Mother   . Sarcoidosis Mother        lungs and skin  . Asthma Mother   . Hypertension Father   . Diabetes Father   . Asthma  Son   . Tics Son   . Asthma Sister   . Cancer Sister        possible pancreatic cancer  . Adrenal disorder Sister        Tumor   . Asthma Brother   . Asthma Daughter        died age 27.5  . Cancer Daughter 4       brain; died age 27.5  . Asthma Son   . Cancer Paternal Aunt        brain, colon, lung and esophagus; unsure of primary   . Breast cancer Paternal Aunt     Social History Social History   Tobacco Use  . Smoking status: Former Smoker    Packs/day: 0.50    Years: 20.00    Pack years: 10.00    Types: Cigarettes    Quit date: 03/17/2014    Years since quitting: 5.0  . Smokeless tobacco: Never Used  Substance Use Topics  . Alcohol use: No    Alcohol/week: 0.0 standard drinks  . Drug use: No     Allergies   Coconut oil, Morphine and related, Oxycodone, Ciprofloxacin, Doxycycline, Adhesive [tape], and Penicillins   Review of Systems Review of Systems  Unable to perform ROS: Psychiatric disorder     Physical Exam Updated Vital Signs BP 117/79  Pulse 78   Temp (!) 97.5 F (36.4 C) (Oral)   Resp 17   LMP 06/02/2017 Comment: hysterectomy 06/08/2017  SpO2 100%   Physical Exam Vitals signs and nursing note reviewed.  Constitutional:      Appearance: She is well-developed. She is not ill-appearing or diaphoretic.     Comments: Appears nontoxic  HENT:     Head: Normocephalic and atraumatic.  Neck:     Musculoskeletal: Normal range of motion and neck supple.  Cardiovascular:     Rate and Rhythm: Normal rate and regular rhythm.     Pulses: Normal pulses.  Pulmonary:     Effort: Pulmonary effort is normal.     Breath sounds: Normal breath sounds. No stridor. No wheezing or rhonchi.  Abdominal:     General: There is no distension.     Palpations: Abdomen is soft. There is no mass.  Musculoskeletal:        General: No swelling or deformity.  Skin:    General: Skin is warm.     Capillary Refill: Capillary refill takes less than 2 seconds.     Findings:  No rash.      ED Treatments / Results  Labs (all labs ordered are listed, but only abnormal results are displayed) Labs Reviewed  COMPREHENSIVE METABOLIC PANEL - Abnormal; Notable for the following components:      Result Value   Potassium 2.9 (*)    CO2 18 (*)    Calcium 8.7 (*)    Total Bilirubin 0.1 (*)    All other components within normal limits  ETHANOL - Abnormal; Notable for the following components:   Alcohol, Ethyl (B) 122 (*)    All other components within normal limits  ACETAMINOPHEN LEVEL - Abnormal; Notable for the following components:   Acetaminophen (Tylenol), Serum <10 (*)    All other components within normal limits  CBC - Abnormal; Notable for the following components:   Hemoglobin 11.7 (*)    All other components within normal limits  RAPID URINE DRUG SCREEN, HOSP PERFORMED - Abnormal; Notable for the following components:   Benzodiazepines POSITIVE (*)    All other components within normal limits  SARS CORONAVIRUS 2 (HOSPITAL ORDER, [facility])  SALICYLATE LEVEL  CBG MONITORING, ED  I-STAT BETA HCG BLOOD, ED ([facility], [facility], [facility] ONLY)    EKG EKG Interpretation  Date/Time:  Saturday April 08 2019 00:02:33 EDT Ventricular Rate:  96 PR Interval:    QRS Duration: 91 QT Interval:  377 QTC Calculation: 477 R Axis:   -8 Text Interpretation:  Sinus rhythm Low voltage, precordial leads No acute changes No significant change since last tracing Confirmed by Varney Biles [id number]) on 04/08/2019 1:44:29 AM   Radiology Dg Abdomen Acute W/chest  Result Date: 04/08/2019 CLINICAL DATA:  Initial evaluation for foreign body, swallowed bag of marijuana. EXAM: DG ABDOMEN ACUTE W/ 1V CHEST COMPARISON:  Prior radiograph from 12/15/2017 FINDINGS: Cardiac and mediastinal silhouettes are within normal limits. Lungs hypoinflated. No focal infiltrates. No edema or effusion. No pneumothorax. Bowel gas pattern within normal limits without obstruction or  ileus. No abnormal bowel wall thickening. No free air on upright view. Surgical clips overlie the left upper quadrant. Additional surgical clips overlies the pelvis. 4 mm calcific density overlies the right renal shadow, suspicious for nephrolithiasis. On supine view, there is an irregular approximate 2.3 cm somewhat linear density seen just to the right lateral aspect of the T12 vertebral body, indeterminate, but could  conceivably reflect an ingested foreign body. This is not well seen on additional views. No other definite radiopaque foreign body. Osseous structures within normal limits. IMPRESSION: 1. 2.3 cm irregular density overlying the right upper abdomen on supine view, indeterminate, but could reflect an ingested foreign body. 2. Nonobstructive bowel gas pattern with no other radiographic evidence for acute intra-abdominal process. 3. Probable right renal nephrolithiasis. 4. No active cardiopulmonary disease. Electronically Signed   By: Jeannine Boga M.D.   On: 04/08/2019 01:26    Procedures .Critical Care Performed by: Franchot Heidelberg, PA-C Authorized by: Franchot Heidelberg, PA-C   Critical care provider statement:    Critical care time (minutes):  45   Critical care time was exclusive of:  Separately billable procedures and treating other patients and teaching time   Critical care was necessary to treat or prevent imminent or life-threatening deterioration of the following conditions:  CNS failure or compromise   Critical care was time spent personally by me on the following activities:  Blood draw for specimens, development of treatment plan with patient or surrogate, evaluation of patient's response to treatment, examination of patient, obtaining history from patient or surrogate, ordering and performing treatments and interventions, ordering and review of laboratory studies, ordering and review of radiographic studies, pulse oximetry, re-evaluation of patient's condition and review  of old charts   I assumed direction of critical care for this patient from another provider in my specialty: no   Comments:     Pt with acute AMS and concern for OD requiring multiple reassessments and close monitoring.    (including critical care time)  Medications Ordered in ED Medications  potassium chloride SA (K-DUR) CR tablet 20 mEq (has no administration in time range)  albuterol (VENTOLIN HFA) 108 (90 Base) MCG/ACT inhaler 2 puff (has no administration in time range)  FLUoxetine (PROZAC) 20 MG/5ML solution 40 mg (has no administration in time range)  HYDROcodone-acetaminophen (NORCO) 10-325 MG per tablet 1 tablet (has no administration in time range)  pantoprazole (PROTONIX) EC tablet 40 mg (has no administration in time range)  thiamine (VITAMIN B-1) tablet 100 mg (has no administration in time range)  potassium chloride 10 mEq in 100 mL IVPB (0 mEq Intravenous Stopped 04/08/19 0603)  polyethylene glycol-electrolytes (NuLYTELY/GoLYTELY) solution 4,000 mL (4,000 mLs Oral Given 04/08/19 0545)     Initial Impression / Assessment and Plan / ED Course  I have reviewed the triage vital signs and the nursing notes.  Pertinent labs & imaging results that were available during my care of the patient were reviewed by me and considered in my medical decision making (see chart for details).        Patient presenting for evaluation of SI.  Combative with EMS, given Haldol and Versed prior to arrival.  As such, patient is responsive only to pain upon my evaluation.  Will obtain labs, EKG, and acute abdomen chest due to patient swallowing a bag of marijuana.  She cannot confirm that it was only marijuana at this time due to her mental status.  Labs show hypokalemia at 2.9.  As she is unable to tolerate p.o. at this time, will give 312 potassium to bring it up to 3.2.  Will supplement with p.o.  when patient is awake.  Ethanol mildly elevated at 122.  Otherwise labs reassuring.  Acute abdomen  viewed and interpreted by me, shows foreign body overlying the right upper abdomen, consistent with history.  On reassessment, patient will respond to verbal stimulation, however responses  are garbled.  On reassessment, patient is more awake.  Alert to place and event.  Patient states she was drinking a lot, and this is why all of this happened.  She denies SI at this time.  She states she did not take any of the pills that were in the pill bottles, those are her children's pills.  At first patient states she does not know what was in the bag that she swallowed, but then confirms that was a small amount of weed.  She confirms that there is no Cocaine or heroin in the bag. will give Golytely to encourage BM. Poison control had advised overnight eval. She has been in the ED for 6 hrs, will be here for another 2 for covid swab. As such, she is currently medically cleared for psych.  As patient is awake, will consult with TTS.  [place] recommends admission. Home meds ordered.   Final Clinical Impressions(s) / ED Diagnoses   Final diagnoses:  Suicidal ideation  Alcohol use    ED Discharge Orders    None       Franchot Heidelberg, PA-C 04/08/19 0724    Franchot Heidelberg, PA-C 04/09/19 7011    Varney Biles, MD 04/09/19 [phone]

## 2019-04-08 NOTE — ED Notes (Signed)
Patient has been placed under IVC by [employer]

## 2019-04-08 NOTE — Progress Notes (Signed)
TTS calling cart to do assessment, no answer.  Lind Covert, MSW, LCSW Therapeutic Triage Specialist  [phone]

## 2019-04-08 NOTE — [facility] Group Notes (Signed)
[facility] Group Notes:  (Nursing)  Date:  04/08/2019  Time: 100 PM Type of Therapy:  Nurse Education  Participation Level:  Did Not Attend  [name] 04/08/2019, 3:10 PM

## 2019-04-08 NOTE — ED Notes (Signed)
She is resting comfortably in sight of all in front of charge desk.

## 2019-04-08 NOTE — Progress Notes (Signed)
Advised cart was being used in triage 8, cart is now available.

## 2019-04-08 NOTE — Progress Notes (Signed)
Pt accepted to [facility] 300-1 after 8am pending negative Covid test. Attending provider will be Dr. Parke Poisson, MD. Call to report [phone].   TTS advised Bubba Hales, RN.  Lind Covert, MSW, LCSW Therapeutic Triage Specialist  [phone]

## 2019-04-08 NOTE — H&P (Signed)
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 Kitchen nystatin (MYCOSTATIN) 100000 UNIT/ML suspension Take 5 mLs (500,000 Units total) by mouth 4 (four) times daily. Swish and swallow 120 mL 0   . ondansetron (ZOFRAN-ODT) 8 MG disintegrating tablet Take 1 tablet (8 mg total) by mouth every 8 (eight) hours as needed for nausea. 20 tablet 5   . pantoprazole (PROTONIX) 40 MG tablet Take 1 tablet (40 mg total) by mouth 2 (two) times daily before a meal. 180 tablet 1   . polyethylene glycol (MIRALAX /  GLYCOLAX) packet Take 17 g by mouth 2 (two) times daily. 14 each 0   . thiamine (VITAMIN B-1) 100 MG tablet Take 100 mg by mouth daily at 12 noon.      . Vitamin D, Ergocalciferol, (DRISDOL) 50000 units CAPS capsule Take 1 capsule (50,000 Units total)  by mouth every 7 (seven) days. 12 capsule 0     Musculoskeletal: Strength & Muscle Tone: within normal limits Gait & Station: normal Patient leans: N/A  Psychiatric Specialty Exam: Physical Exam  Review of Systems  Constitutional: Negative for chills and fever.  HENT: Negative.   Eyes: Negative.   Respiratory: Negative for cough and shortness of breath.   Cardiovascular: Positive for chest pain.  Gastrointestinal: Negative for diarrhea, nausea and vomiting.  Genitourinary: Negative.   Musculoskeletal: Positive for back pain and joint pain.       (+) knee and back pain  Skin: Negative.  Negative for rash.  Neurological: Positive for headaches. Negative for seizures.       Reports history of migraines    Endo/Heme/Allergies: Negative.   Psychiatric/Behavioral: Positive for suicidal ideas.    Last menstrual period 06/02/2017.There is no height or weight on file to calculate BMI.  General Appearance: Fairly Groomed  Eye Contact:  Fair  Speech:  Normal Rate  Volume:  Normal  Mood:  minimizes depression and states mood is 8/10 with 10 being best   Affect:  appropriate, smiles at times appropriately during session  Thought Process:  Linear and Descriptions of Associations: Intact  Orientation:  Full (Time, Place, and Person)  Thought Content:  no hallucinations, no delusions , not internally preoccupied   Suicidal Thoughts:  No denies any suicidal or self injurious ideations, denies homicidal or violent ideations, contracts for safety on unit   Homicidal Thoughts:  No  Memory:  recent and remote grossly intact   Judgement:  Fair  Insight:  Fair  Psychomotor Activity:  Decreased  Concentration:  Concentration: Good and Attention Span: Good  Recall:  Good  Fund of Knowledge:  Good  Language:  Good  Akathisia:  Negative  Handed:  Right  AIMS (if indicated):     Assets:  Communication Skills Desire for Improvement Resilience  ADL's:  Intact  Cognition:  WNL  Sleep:        Treatment Plan Summary: Daily contact with patient to assess and evaluate symptoms and progress in treatment, Medication management, Plan inpatient treatment  and medications as below  Observation Level/Precautions:  15 minute checks  Laboratory:  as needed   Psychotherapy:  Milieu, group therapy   Medications:  Continue Ativan detox PRN as per CIWA score to address possible ETOH/BZD WDL if needed She states she has been compliant with Cymbalta at 120 mgrs daily. Resume Cymbalta 60 mgrs BID Reports Gabapentin was helping pain but had not been taking it as it made her too sedated in combination with Nortriptyline and Opiate analgesic. Expresses interest in restarting. Resume Neurontin at 100 mgrs TID  As patient has been off opiates x 3-4 days and currently presents calm, comfortable, in no acute distress and not presenting with any opiate WDL symptoms at this time, will not initiate opiate detox protocol at this time.   Consultations: as needed   Discharge Concerns:  -  Estimated LOS: 3-4 days  Other:     Physician Treatment Plan for Primary Diagnosis:  Suicidal Ideations  Long Term Goal(s): Improvement in symptoms so as ready for discharge  Short Term Goals: Ability to  identify changes in lifestyle to reduce recurrence of condition will improve, Ability to verbalize feelings will improve, Ability to disclose and discuss suicidal ideas, Ability to demonstrate self-control will improve, Ability to identify and develop effective coping behaviors will improve, Ability to maintain clinical measurements within normal limits will improve and Compliance with prescribed medications will improve  Physician Treatment Plan for Secondary Diagnosis: Active Problems:   MDD (major depressive disorder), recurrent severe, without psychosis ([place])  Long Term Goal(s): Improvement in symptoms so as ready for discharge  Short Term Goals: Ability to identify changes in lifestyle to reduce recurrence of  condition will improve, Ability to verbalize feelings will improve, Ability to disclose and discuss suicidal ideas, Ability to demonstrate self-control will improve, Ability to identify and develop effective coping behaviors will improve, Ability to maintain clinical measurements within normal limits will improve and Compliance with prescribed medications will improve  I certify that inpatient services furnished can reasonably be expected to improve the patient's condition.    Jenne Campus, MD 7/11/20203:32 PM
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
## 2019-04-08 NOTE — ED Notes (Signed)
[employer] states that patient told her she took a "few" dilaudid-Rx that was not hers

## 2019-04-08 NOTE — ED Notes (Signed)
She has contacted her mom via her [employer] watch. She is in no distress and remains ambulatory without difficulty. She is calm and cooperative.

## 2019-04-08 NOTE — ED Notes (Signed)
I have just given report to [place], Therapist, sports at [facility]. I will now phone [facility] for transport. She is ambulatory to b.r. At this time.

## 2019-04-08 NOTE — Progress Notes (Signed)
Anette Riedel, NP recommends inpt tx. TTS to seek placement, EDP Caccavale, Sophia, PA-C has been advised.    Lind Covert, MSW, LCSW Therapeutic Triage Specialist  [phone]

## 2019-04-08 NOTE — [facility] Suicide Risk Assessment (Signed)
[facility] Admission Suicide Risk Assessment   Nursing information obtained from:   patient and chart  Demographic factors:   43 year old female, two children, on disability Current Mental Status:   see below Loss Factors:   recent argument /break up with BF, on disability, chronic pain issues Historical Factors:   no prior admissions, no history of suicide attempts Risk Reduction Factors:   resilience , sense of responsibility to children and family  Total Time spent with patient: 45 minutes Principal Problem:  Suicidal Ideations  Diagnosis:  Active Problems:   MDD (major depressive disorder), recurrent severe, without psychosis ([place])  Subjective Data:   Continued Clinical Symptoms:    The "Alcohol Use Disorders Identification Test", Guidelines for Use in Primary Care, Second Edition.  World Pharmacologist [facility]). Score between 0-7:  no or low risk or alcohol related problems. Score between 8-15:  moderate risk of alcohol related problems. Score between 16-19:  high risk of alcohol related problems. Score 20 or above:  warrants further diagnostic evaluation for alcohol dependence and treatment.   CLINICAL FACTORS:  43 year old female , presented to ED via [facility] after making suicidal threats while she held a knife. Patient reports this was in the context of being acutely upset due to argument and break up with BF, but denies any actual suicidal plan or intention. She also had been drinking on day of event, which she feels contributed to the above. Denies  significant depression or any suicidal ideations prior to event and minimizes neuro-vegetative symptoms. Denies pattern of alcohol abuse and states she rarely drinks. She reports history of chronic pain for which she was in treatment with Gabapentin,  Hydrocodone, Nortriptyline, Cymbalta, but states was only taking Cymbalta regularly. States she last took opiate analgesics 3-4 days ago, and is not currently presenting with or endorsing opiate  WDL symptoms.    Psychiatric Specialty Exam: Physical Exam  ROS  Last menstrual period 06/02/2017.There is no height or weight on file to calculate BMI.  See admit note MSE   COGNITIVE FEATURES THAT CONTRIBUTE TO RISK:  Closed-mindedness and Loss of executive function    SUICIDE RISK:   Moderate:  Frequent suicidal ideation with limited intensity, and duration, some specificity in terms of plans, no associated intent, good self-control, limited dysphoria/symptomatology, some risk factors present, and identifiable protective factors, including available and accessible social support.  PLAN OF CARE: Patient will be admitted to inpatient psychiatric unit for stabilization and safety. Will provide and encourage milieu participation. Provide medication management and maked adjustments as needed.  Will follow daily.    I certify that inpatient services furnished can reasonably be expected to improve the patient's condition.   Jenne Campus, MD 04/08/2019, 4:19 PM

## 2019-04-08 NOTE — Progress Notes (Signed)
Patient ID: [name], female   DOB: Sep 03, 1976, 43 y.o.   MRN: [id number] This is a 43yo AA female, admitted IVC'd to [facility]' suicidal statement after 10 year breakup with BF yesterday. She says she is on disability form her employer [employer] and [employer]) and has struggled with depression for " a log time". She says she has 2 children that live with her ( 2 boys- 39yo and 41yo) and her mother ( who lives next door) is caring for them now.  She has a 3rd boy- 41yo that lives in [place],. She reportedly had a melt down " I got upset after I broke up with my BF", drank a bottle of wine " I can't remember what happened after that" and ended up IVC'd here after threatening suicide to ED staff yesterday. Apparently her mother called 17 when she got out of control yesterday. She reports a PMH significant for s/p gastric sleeve in 2017 ( " I rejected it and have had multiple gastric surgeries since then"),recently had TKR on left knee and release of right wrist carpel tunnel , rectal repair, bilat toe surgery, hysterectomy, and arthroscopy on both knees. She denies any COVID 19 exposure, is adamant that she is not suicidal and is anxious to speak with the physician so she can be discharged. Patient admssion is completed by this Probation officer and she is oriented to the unit.

## 2019-04-08 NOTE — ED Notes (Signed)
Grandmother is with patient's minor children at present time.

## 2019-04-08 NOTE — ED Notes (Addendum)
Spoke with [employer] Control: -Narcan as needed for Dilaudid ingestion -IVF's -Tylenol level>25 actedote -observe for tachycardia -symptomatic supportive care -observe overnight -if agitation continues, obtain CPK

## 2019-04-08 NOTE — Tx Team (Addendum)
Initial Treatment Plan 04/08/2019 4:43 PM [name] FMB:[id number]    PATIENT STRESSORS: Educational concerns Financial difficulties Health problems Occupational concerns   PATIENT STRENGTHS: Ability for insight Active sense of humor Average or above average intelligence Capable of independent living   PATIENT IDENTIFIED PROBLEMS: MDD  Substance Abuse    MDD  Suicidality  SUbstance Abuse      " I love my family"  " There's nothing wrong with me"   DISCHARGE CRITERIA:  Ability to meet basic life and health needs Adequate post-discharge living arrangements Improved stabilization in mood, thinking, and/or behavior  PRELIMINARY DISCHARGE PLAN: Attend aftercare/continuing care group Attend PHP/IOP Attend 12-step recovery group Outpatient therapy  PATIENT/FAMILY INVOLVEMENT: This treatment plan has been presented to and reviewed with the patient, [name], and/or family member, The patient and family have been given the opportunity to ask questions and make suggestions.  Lauralyn Primes, RN 04/08/2019, 4:43 PM

## 2019-04-08 NOTE — ED Notes (Signed)
Pt has GoLYTELY at bedside. Pt sleeping.

## 2019-04-09 MED ORDER — EPINEPHRINE 0.3 MG/0.3ML IJ SOAJ
0.3000 mg | Freq: Once | INTRAMUSCULAR | Status: AC
  Administered 2019-04-09: 23:00:00 0.3 mg via INTRAMUSCULAR
  Filled 2019-04-09: qty 0.3

## 2019-04-09 MED ORDER — DIPHENHYDRAMINE HCL 50 MG/ML IJ SOLN
50.0000 mg | INTRAMUSCULAR | Status: AC
  Administered 2019-04-09: 50 mg via INTRAMUSCULAR
  Filled 2019-04-09 (×2): qty 1

## 2019-04-09 NOTE — Progress Notes (Signed)
D Pt is observed OOB UAL on the 300 hall today she tolerates this atmosphere better today, interacting frequently with her peers as well as the staff. SHe wears hospital-issue patient scrubs. SHe limps when she ambulates and says " sometimes my pain in my knee gets to me".She is less sad and depressed today and this is evidenced by her making better eye contact. She attends her Life SKills group and is engaged in the group discussion . She shared personal experiences with her peers and she shared things she had learned .      A She completed her daily assessment and on this she wrote she denied having SI today and she rated her depression, hopelessness and anxiety " 0/0/0/", respectively. She stated even though she is missing her children, as they are leaving today for [place], she is beginning to understand that she needs to be here and being her " has helped me a lot...ibuprofen'm glad I came".     R Safety is in place and therapeutic relationship fostered.

## 2019-04-09 NOTE — [facility] Group Notes (Signed)
[facility] LCSW Group Therapy Note  Date/Time:  04/09/2019  9:30AM-10:15PM  Type of Therapy and Topic:  Group Therapy:  Music and Relapse Prevention  Participation Level:  Active   Description of Group: In this process group, members discussed what types of music trigger them to drug or alcohol use and what they can do about this in the future.  For instance, we discussed what to do when riding in a friend's car and a song harmful to the patient starts playing.  We also discussed how music can be used as a tool to help with wellness and recovery in various ways including managing depression and anxiety as well as encouraging healthy sleep habits and avoiding relapse.  Three songs were played as examples, including "Breaking Down," "Wine Into Water," and "Brand New Me."  Comments were elicited which showed the group to be in agreement that the songs were quite relatable and have the potential to help them out of the hospital setting.  Therapeutic Goals: 1. Patients will be introduced to three specific songs that can relate to addictions in a helpful way. 2. Patients will explore the impact of different pieces of music on their feelings, i.e. uplifting, triggering, etc. 3. Patients will discuss how to use this self-knowledge to assist in preventing relapse.  Summary of Patient Progress:  At the beginning of group, patient expressed that she normally does not drink and does not have an alcohol problem.  She explained that she decided to drink when she and her boyfriend of 10 years broke up, but because of surgeries her stomach is tiny and that made her drunk very quickly.  While drunk, she wanted to die, but she now states "that is not an option because my children's daddy died in 12/18/09 and I am their only parent."  She also talked about how music impacted her around the time of her daughter's death and how she still uses certain songs from that time to lift her spirits.  Therapeutic Modalities: Solution  Focused Brief Therapy Activity   Selmer Dominion, LCSW

## 2019-04-09 NOTE — Progress Notes (Signed)
[place] NOVEL CORONAVIRUS (COVID-19) DAILY CHECK-OFF SYMPTOMS - answer yes or no to each - every day NO YES  Have you had a fever in the past 24 hours?  . Fever (Temp > 37.80C / 100F) X   Have you had any of these symptoms in the past 24 hours? . New Cough .  Sore Throat  .  Shortness of Breath .  Difficulty Breathing .  Unexplained Body Aches   X   Have you had any one of these symptoms in the past 24 hours not related to allergies?   . Runny Nose .  Nasal Congestion .  Sneezing   X   If you have had runny nose, nasal congestion, sneezing in the past 24 hours, has it worsened?  X   EXPOSURES - check yes or no X   Have you traveled outside the state in the past 14 days?  X   Have you been in contact with someone with a confirmed diagnosis of COVID-19 or PUI in the past 14 days without wearing appropriate PPE?  X   Have you been living in the same home as a person with confirmed diagnosis of COVID-19 or a PUI (household contact)?    X   Have you been diagnosed with COVID-19?    X              What to do next: Answered NO to all: Answered YES to anything:   Proceed with unit schedule Follow the [facility] Inpatient Flowsheet.   

## 2019-04-09 NOTE — Progress Notes (Signed)
Patient came to nurses station requesting medication for anxiety and sleep. PRN trazodone 50 mg and vistaril 25 mg administered at  2221. Patient later went to room at 2250 this writer went to check on patient and patient complained of feeling of breaking out in hives and difficulty swallowing. Provider Harold Hedge PA notified and assessed patient. New order for benadryl 50 mg IM administered at 2258. Patient continued to voice difficulty swallowing at 2315 vitals 128/94-80-18-100%. New order for Epi-pen 0.3 mg IM administered at 2326 will continue to monitor patient.

## 2019-04-09 NOTE — Progress Notes (Signed)
Patient ID: [name], female   DOB: 04-Sep-1976, 43 y.o.   MRN: [id number] S-Called bu nursing for pt c/o facial rash;throat tightening;anxious.Nurse reports recent dose of Trazodone.Pt has significant allergy history including anaphylaxis O-Vital signs pending Pt ill appearing holding throat/trachea c/o of itching. Fine papular facial rash with flushing. Tongue appears swollen slightly A- Allergic reaction-?Trazodone/ingredient in Trazodone in pt with significant allergy history P-Benadryl 2m IM Stat If no/partial response EPIPen or equivalent Stat repeat  If response inadwquate x 1 and transfer to ED

## 2019-04-09 NOTE — Progress Notes (Signed)
[facility] MD Progress Note  04/09/2019 9:49 AM [name]  MRN:  [id number] Subjective: Patient reports "I am feeling okay".  Presents future oriented and is hopeful for discharge soon.  States she has an appointment tomorrow she does not want to miss.  She is looking forward to seeing her children.  Denies any suicidal ideations.  Currently denies symptoms of opiate withdrawal and does not appear to be in any acute distress or discomfort. Objective: I have reviewed chart notes and have met with patient.  43 year old female , presented to ED via [facility] after making suicidal threats while she held a knife. Patient reports this was in the context of being acutely upset due to argument and break up with BF, but denies any actual suicidal plan or intention. She also had been drinking on day of event, which she feels contributed to the above. Denies  significant depression or any suicidal ideations prior to event and minimizes neuro-vegetative symptoms. Denies pattern of alcohol abuse and states she rarely drinks. She reports history of chronic pain for which she was in treatment with Gabapentin,  Hydrocodone, Nortriptyline, Cymbalta, but states was only taking Cymbalta regularly. States she last took opiate analgesics 3-4 days ago, and is not currently presenting with or endorsing opiate WDL symptoms.   Today patient presents calm, pleasant on approach, reporting improved mood, denies depression and presents euthymic with a full range of affect.  Expresses regret regarding event that led to admission and describes as isolated  episode which occurred in the context of relationship stress and alcohol intoxication.  Denies depression or neurovegetative symptoms, denies suicidal ideations.  Future oriented and looking forward to returning home soon in order to reunite with her children. She is not presenting with symptoms of opiate withdrawal.  Vitals are stable.  She states her pain is "manageable".  Currently does  not appear to be in any acute distress or discomfort. Principal Problem: Suicidal Ideations Diagnosis: Active Problems:   MDD (major depressive disorder), recurrent severe, without psychosis ([place])  Total Time spent with patient: 15 minutes  Past Psychiatric History:   Past Medical History:  Past Medical History:  Diagnosis Date  . Anal fissure - posterior 10/16/2014    OCC  ISSUES  . Anxiety    doesn't take anything  . Asthma    has Albuterol inhaler as needed  . Boil    on pubic area started septra 03-01-17 draining blood and pus  . Bronchitis   . Chronic headache disorder 07/22/2016  . Clostridium difficile infection 04/20/2012  . Colitis   . Dehydration   . Depression   . Family history of adverse reaction to anesthesia    pt mom gets sick  . GERD (gastroesophageal reflux disease)    takes Pantoprazole daily  . Headache   . Hiatal hernia    neuropathy - mild in arms and legs  . History of blood transfusion    last transfusion was 04/04/2016=Benadryl was given d/t itching. States she always itches with transfusion.   Marland Kitchen History of bronchitis    > 2 yrs ago  . History of colon polyps    benign  . History of migraine    last one 05/01/16  . History of stomach ulcers   . History of urinary tract infection LAST 2 WEEKS AGO  . IDA (iron deficiency anemia)   . Internal and external bleeding hemorrhoids 06/11/2014  . Joint pain   . Joint swelling   . Left sided  chronic colitis - segmental 06/11/2014  . Marginal ulcer 10/25/2017  . Migraine   . Motion sickness   . Nausea    takes Zofran as needed  . Nausea and vomiting    for 1 year  . Obesity   . Oligouria   . Osteoarthritis    lower back, knees, wrists - no meds  . Peripheral neuropathy 03/01/2019  . Pneumonia 1997  . Postoperative nausea and vomiting 01/21/2016   wants scopolamine patch  . Right carpal tunnel syndrome 03/01/2019  . SVD (spontaneous vaginal delivery)    x 4  . Tingling    BOTH LOWER EXTREMETIES ALL THE  TIME DUE TO RAPID WEIGHT LOSS  . TOBACCO USER 10/02/2009   Qualifier: Diagnosis of  By: Dimas Millin MD, Ellard Artis    . Transfusion history    last transfusion 6'16   . Tremor 08/31/2018  . UC (ulcerative colitis) ([place])    supposed to be taking Lialda and Bentyl but has been off since gastric sleeve  . Vertigo    doesn't take any meds    Past Surgical History:  Procedure Laterality Date  . ABDOMINAL HYSTERECTOMY     PARTIAL  . COLONOSCOPY  2007   for rectal bleeding; [facility]  . COLONOSCOPY N/A 06/11/2014   Procedure: COLONOSCOPY;  Surgeon: Gatha Mayer, MD;  Location: [facility];  Service: Endoscopy;  Laterality: N/A;  . COLONOSCOPY WITH PROPOFOL N/A 03/02/2017   Procedure: COLONOSCOPY WITH PROPOFOL;  Surgeon: Alphonsa Overall, MD;  Location: [facility];  Service: General;  Laterality: N/A;  . DILATION AND CURETTAGE OF UTERUS    . ESOPHAGOGASTRODUODENOSCOPY (EGD) WITH PROPOFOL N/A 04/06/2016   Procedure: ESOPHAGOGASTRODUODENOSCOPY (EGD) WITH PROPOFOL;  Surgeon: Alphonsa Overall, MD;  [facility];  Service: General;  Laterality: N/A;  . ESOPHAGOGASTRODUODENOSCOPY (EGD) WITH PROPOFOL N/A 03/02/2017   Procedure: ESOPHAGOGASTRODUODENOSCOPY (EGD) WITH PROPOFOL;  Surgeon: Alphonsa Overall, MD;  Location: [facility];  Service: General;  Laterality: N/A;  . ESOPHAGOGASTRODUODENOSCOPY (EGD) WITH PROPOFOL N/A 05/20/2017   Procedure: ESOPHAGOGASTRODUODENOSCOPY (EGD) WITH PROPOFOL ERAS PATHWAY;  Surgeon: Alphonsa Overall, MD;  Location: [facility];  Service: General;  Laterality: N/A;  . ESOPHAGOGASTRODUODENOSCOPY (EGD) WITH PROPOFOL N/A 10/07/2017   Procedure: ESOPHAGOGASTRODUODENOSCOPY (EGD) WITH PROPOFOL;  Surgeon: Alphonsa Overall, MD;  Location: [facility];  Service: General;  Laterality: N/A;  . EXCISION OF SKIN TAG  06/08/2017   Procedure: EXCISION OF VULVAR SKIN TAGS X2;  Surgeon: [name], MD;  Location: [facility];  Service: Gynecology;;  . FOOT SURGERY Bilateral    x 2  . GASTRIC ROUX-EN-Y N/A  12/14/2016   Procedure: LAPAROSCOPIC REVISION SLEEVE GASTRECTOMY TO  ROUX-Y-GASTRIC BY-PASS, UPPER ENDO;  Surgeon: Excell Seltzer, MD;  [facility];  Service: General;  Laterality: N/A;  . GASTROJEJUNOSTOMY N/A 05/11/2016   Procedure: LAPAROSCOPIC PLACEMENT  OF FEEDING  JEJUNOSTOMY TUBE;  Surgeon: Excell Seltzer, MD;  [facility];  Service: General;  Laterality: N/A;  . HEMORRHOIDECTOMY WITH HEMORRHOID BANDING    . IR FLUORO GUIDE CV LINE RIGHT  04/06/2017  . IR GENERIC HISTORICAL  05/19/2016   IR CM INJ ANY COLONIC TUBE W/FLUORO 05/19/2016 Aletta Edouard, MD [facility]  . IR PATIENT EVAL TECH 0-60 MINS  11/30/2017  . IR REPLC DUODEN/JEJUNO TUBE PERCUT W/FLUORO  03/14/2018  . IR REPLC DUODEN/JEJUNO TUBE PERCUT W/FLUORO  03/15/2018  . IR US GUIDE VASC ACCESS RIGHT  04/06/2017  . j tube removed march 2018    . KNEE ARTHROSCOPY Left 09/06/2014  .  LAPAROSCOPIC GASTRIC SLEEVE RESECTION N/A 01/14/2016   Procedure: LAPAROSCOPIC GASTRIC SLEEVE RESECTION;  Surgeon: Excell Seltzer, MD;  [facility];  Service: General;  Laterality: N/A;  . LAPAROSCOPIC REVISION OF GASTROJEJUNOSTOMY N/A 10/25/2017   Procedure: LAPAROSCOPIC REVISION OF GASTROJEJUNOSTOMY AND PARTIAL GASTRECTOMY, WITH PLACEMENT OF FEEDING GASTROSTOMY TUBE;  Surgeon: Excell Seltzer, MD;  [facility];  Service: General;  Laterality: N/A;  . LAPAROSCOPIC TUBAL LIGATION  10/16/2011   Procedure: LAPAROSCOPIC TUBAL LIGATION;  Surgeon: Alwyn Pea, MD;  Location: [facility];  Service: Gynecology;  Laterality: Bilateral;  . NOVASURE ABLATION  09/28/2010   mild persistent vaginal bleeding  . right knee arthroscopy     05/07/2016  . TOTAL KNEE ARTHROPLASTY Left 03/10/2018   Procedure: LEFT TOTAL KNEE ARTHROPLASTY;  Surgeon: Paralee Cancel, MD;  Location: [facility];  Service: Orthopedics;  Laterality: Left;  70 mins  . TUBAL LIGATION    . VAGINAL HYSTERECTOMY Bilateral 06/08/2017   Procedure: HYSTERECTOMY VAGINAL W/ BILATERAL  SALPINGECTOMY;  Surgeon: [name], MD;  Location: [facility];  Service: Gynecology;  Laterality: Bilateral;  . wisdom teeth extracted     Family History:  Family History  Problem Relation Age of Onset  . Hypertension Mother   . Diabetes Mother   . Sarcoidosis Mother        lungs and skin  . Asthma Mother   . Hypertension Father   . Diabetes Father   . Asthma Son   . Tics Son   . Asthma Sister   . Cancer Sister        possible pancreatic cancer  . Adrenal disorder Sister        Tumor   . Asthma Brother   . Asthma Daughter        died age 619.5  . Cancer Daughter 4       brain; died age 619.5  . Asthma Son   . Cancer Paternal Aunt        brain, colon, lung and esophagus; unsure of primary   . Breast cancer Paternal Aunt    Family Psychiatric  History:  Social History:  Social History   Substance and Sexual Activity  Alcohol Use Yes  . Alcohol/week: 0.0 standard drinks     Social History   Substance and Sexual Activity  Drug Use No    Social History   Socioeconomic History  . Marital status: Single    Spouse name: Not on file  . Number of children: 3  . Years of education: Some college  . Highest education level: Not on file  Occupational History  . Occupation: Production designer, theatre/television/film    Comment: Currently out on disability d/t surg  Social Needs  . Financial resource strain: Not hard at all  . Food insecurity    Worry: Never true    Inability: Never true  . Transportation needs    Medical: No    Non-medical: No  Tobacco Use  . Smoking status: Former Smoker    Packs/day: 0.50    Years: 20.00    Pack years: 10.00    Types: Cigarettes    Quit date: 03/17/2014    Years since quitting: 5.0  . Smokeless tobacco: Never Used  Substance and Sexual Activity  . Alcohol use: Yes    Alcohol/week: 0.0 standard drinks  . Drug use: No  . Sexual activity: Not Currently    Birth control/protection: Surgical, Abstinence  Lifestyle  . Physical activity    Days per  week: 0 days    Minutes per session: 0 min  . Stress: To some extent  Relationships  . Social Herbalist on phone: Once a week    Gets together: Once a week    Attends religious service: Never    Active member of club or organization: No    Attends meetings of clubs or organizations: Not on file    Relationship status: Separated  Other Topics Concern  . Not on file  Social History Narrative   Marital status:  Single; not dating seriously.      Children:  3 sons (71, 61, 32) whose father is deceased, 1 daughter (deceased age 31); no grandchildren      Lives:  With 2 sons      Employment:  Long term disability in 2018 due to PICC line for iv fluids; Corporate treasurer at [employer]      Tobacco: quit June 2015      Alcohol:  None      Drugs:  None      Exercise:  Leg lifts for knee strengthening; walking   1 caffeine beverage daily      Sexual activity:  Active; total partners = up there.  No STDs in past.        Right-handed   Additional Social History:   Sleep: Good  Appetite:  Good  Current Medications: Current Facility-Administered Medications  Medication Dose Route Frequency Provider Last Rate Last Dose  . acetaminophen (TYLENOL) tablet 650 mg  650 mg Oral Q6H PRN Deloria Lair, NP   650 mg at 04/08/19 2137  . alum & mag hydroxide-simeth (MAALOX/MYLANTA) 200-200-20 MG/5ML suspension 30 mL  30 mL Oral Q4H PRN Deloria Lair, NP      . DULoxetine (CYMBALTA) DR capsule 60 mg  60 mg Oral BID Dontario Evetts, Myer Peer, MD   60 mg at 04/09/19 0844  . gabapentin (NEURONTIN) capsule 100 mg  100 mg Oral TID Cameo Shewell, Myer Peer, MD   100 mg at 04/09/19 0844  . hydrOXYzine (ATARAX/VISTARIL) tablet 25 mg  25 mg Oral Q6H PRN Dixon, Rashaun M, NP      . loperamide (IMODIUM) capsule 2-4 mg  2-4 mg Oral PRN Dixon, Rashaun M, NP      . LORazepam (ATIVAN) tablet 1 mg  1 mg Oral Q6H PRN Deloria Lair, NP      . multivitamin with minerals tablet 1 tablet  1 tablet Oral Daily Deloria Lair, NP   1 tablet at 04/09/19 0843  . ondansetron (ZOFRAN-ODT) disintegrating tablet 4 mg  4 mg Oral Q6H PRN Dixon, Rashaun M, NP      . thiamine (B-1) injection 100 mg  100 mg Intramuscular Once Dixon, Rashaun M, NP      . thiamine (VITAMIN B-1) tablet 100 mg  100 mg Oral Daily Dixon, Rashaun M, NP   100 mg at 04/09/19 0913  . traZODone (DESYREL) tablet 50 mg  50 mg Oral QHS PRN Deloria Lair, NP        Lab Results:  Results for orders placed or performed during the hospital encounter of 04/07/19 (from the past 48 hour(s))  Comprehensive metabolic panel     Status: Abnormal   Collection Time: 04/08/19 12:16 AM  Result Value Ref Range   Sodium 137 135 - 145 mmol/L   Potassium 2.9 (L) 3.5 - 5.1 mmol/L   Chloride 108 98 - 111 mmol/L   CO2 18 (  L) 22 - 32 mmol/L   Glucose, Bld 81 70 - 99 mg/dL   BUN 8 6 - 20 mg/dL   Creatinine, Ser 0.70 0.44 - 1.00 mg/dL   Calcium 8.7 (L) 8.9 - 10.3 mg/dL   Total Protein 7.0 6.5 - 8.1 g/dL   Albumin 3.7 3.5 - 5.0 g/dL   AST 17 15 - 41 U/L   ALT 17 0 - 44 U/L   Alkaline Phosphatase 66 38 - 126 U/L   Total Bilirubin 0.1 (L) 0.3 - 1.2 mg/dL   GFR calc non Af Amer >60 >60 mL/min   GFR calc Af Amer >60 >60 mL/min   Anion gap 11 5 - 15    Comment: Performed at [facility], [place] [address], [place], [place] [address]  Ethanol     Status: Abnormal   Collection Time: 04/08/19 12:16 AM  Result Value Ref Range   Alcohol, Ethyl (B) 122 (H) <10 mg/dL    Comment: (NOTE) Lowest detectable limit for serum alcohol is 10 mg/dL. For medical purposes only. Performed at [facility], [place] [address], [place], [place] [address]   Salicylate level     Status: None   Collection Time: 04/08/19 12:16 AM  Result Value Ref Range   Salicylate Lvl <8.6 2.8 - 30.0 mg/dL    Comment: Performed at [facility], [place] [address] [place], [place] [address]  Acetaminophen level     Status: Abnormal   Collection  Time: 04/08/19 12:16 AM  Result Value Ref Range   Acetaminophen (Tylenol), Serum <10 (L) 10 - 30 ug/mL    Comment: (NOTE) Therapeutic concentrations vary significantly. A range of 10-30 ug/mL  may be an effective concentration for many patients. However, some  are best treated at concentrations outside of this range. Acetaminophen concentrations >150 ug/mL at 4 hours after ingestion  and >50 ug/mL at 12 hours after ingestion are often associated with  toxic reactions. Performed at [facility], [place] [address], [place], [place] [address]   cbc     Status: Abnormal   Collection Time: 04/08/19 12:16 AM  Result Value Ref Range   WBC 8.2 4.0 - 10.5 K/uL   RBC 3.89 3.87 - 5.11 MIL/uL   Hemoglobin 11.7 (L) 12.0 - 15.0 g/dL   HCT 37.4 36.0 - 46.0 %   MCV 96.1 80.0 - 100.0 fL   MCH 30.1 26.0 - 34.0 pg   MCHC 31.3 30.0 - 36.0 g/dL   RDW 14.1 11.5 - 15.5 %   Platelets 281 150 - 400 K/uL   nRBC 0.0 0.0 - 0.2 %    Comment: Performed at [facility], [place] [address], [place], [place] [address]  Rapid urine drug screen (hospital performed)     Status: Abnormal   Collection Time: 04/08/19 12:16 AM  Result Value Ref Range   Opiates NONE DETECTED NONE DETECTED   Cocaine NONE DETECTED NONE DETECTED   Benzodiazepines POSITIVE (A) NONE DETECTED   Amphetamines NONE DETECTED NONE DETECTED   Tetrahydrocannabinol NONE DETECTED NONE DETECTED   Barbiturates NONE DETECTED NONE DETECTED    Comment: (NOTE) DRUG SCREEN FOR MEDICAL PURPOSES ONLY.  IF CONFIRMATION IS NEEDED FOR ANY PURPOSE, NOTIFY LAB WITHIN 5 DAYS. LOWEST DETECTABLE LIMITS FOR URINE DRUG SCREEN Drug Class                     Cutoff (ng/mL) Amphetamine and metabolites    1000 Barbiturate and  metabolites    200 Benzodiazepine                 765 Tricyclics and metabolites     300 Opiates and metabolites        300 Cocaine and metabolites        300 THC                            50 Performed at  [facility], [place] [address] [place], [place] [address]   CBG monitoring, ED     Status: None   Collection Time: 04/08/19 12:20 AM  Result Value Ref Range   Glucose-Capillary 80 70 - 99 mg/dL  I-Stat beta hCG blood, ED     Status: None   Collection Time: 04/08/19 12:26 AM  Result Value Ref Range   I-stat hCG, quantitative <5.0 <5 mIU/mL   Comment 3            Comment:   GEST. AGE      CONC.  (mIU/mL)   <=1 WEEK        5 - 50     2 WEEKS       50 - 500     3 WEEKS       100 - 10,000     4 WEEKS     1,000 - 30,000        FEMALE AND NON-PREGNANT FEMALE:     LESS THAN 5 mIU/mL   SARS Coronavirus 2 (CEPHEID - Performed in [place] hospital lab), Hosp Order     Status: None   Collection Time: 04/08/19  7:04 AM   Specimen: Nasopharyngeal Swab  Result Value Ref Range   SARS Coronavirus 2 NEGATIVE NEGATIVE    Comment: (NOTE) If result is NEGATIVE SARS-CoV-2 target nucleic acids are NOT DETECTED. The SARS-CoV-2 RNA is generally detectable in upper and lower  respiratory specimens during the acute phase of infection. The lowest  concentration of SARS-CoV-2 viral copies this assay can detect is 250  copies / mL. A negative result does not preclude SARS-CoV-2 infection  and should not be used as the sole basis for treatment or other  patient management decisions.  A negative result may occur with  improper specimen collection / handling, submission of specimen other  than nasopharyngeal swab, presence of viral mutation(s) within the  areas targeted by this assay, and inadequate number of viral copies  (<250 copies / mL). A negative result must be combined with clinical  observations, patient history, and epidemiological information. If result is POSITIVE SARS-CoV-2 target nucleic acids are DETECTED. The SARS-CoV-2 RNA is generally detectable in upper and lower  respiratory specimens dur ing the acute phase of infection.  Positive  results are indicative of active  infection with SARS-CoV-2.  Clinical  correlation with patient history and other diagnostic information is  necessary to determine patient infection status.  Positive results do  not rule out bacterial infection or co-infection with other viruses. If result is PRESUMPTIVE POSTIVE SARS-CoV-2 nucleic acids MAY BE PRESENT.   A presumptive positive result was obtained on the submitted specimen  and confirmed on repeat testing.  While 2019 novel coronavirus  (SARS-CoV-2) nucleic acids may be present in the submitted sample  additional confirmatory testing may be necessary for epidemiological  and / or clinical management purposes  to differentiate between  SARS-CoV-2 and other Sarbecovirus currently known to infect humans.  If  clinically indicated additional testing with an alternate test  methodology [phone]) is advised. The SARS-CoV-2 RNA is generally  detectable in upper and lower respiratory sp ecimens during the acute  phase of infection. The expected result is Negative. Fact Sheet for Patients:  StrictlyIdeas.no Fact Sheet for Healthcare Providers: BankingDealers.co.za This test is not yet approved or cleared by the [place] FDA and has been authorized for detection and/or diagnosis of SARS-CoV-2 by FDA under an Emergency Use Authorization (EUA).  This EUA will remain in effect (meaning this test can be used) for the duration of the COVID-19 declaration under Section 564(b)(1) of the Act, 21 U.S.C. section 360bbb-3(b)(1), unless the authorization is terminated or revoked sooner. Performed at [facility], [place] [address] [place], [place] [address]   I-stat chem 8, ED (not at [facility] or [facility])     Status: Abnormal   Collection Time: 04/08/19  8:01 AM  Result Value Ref Range   Sodium 139 135 - 145 mmol/L   Potassium 4.3 3.5 - 5.1 mmol/L   Chloride 107 98 - 111 mmol/L   BUN 3 (L) 6 - 20 mg/dL   Creatinine, Ser 0.60  0.44 - 1.00 mg/dL   Glucose, Bld 78 70 - 99 mg/dL   Calcium, Ion 1.14 (L) 1.15 - 1.40 mmol/L   TCO2 22 22 - 32 mmol/L   Hemoglobin 11.9 (L) 12.0 - 15.0 g/dL   HCT 35.0 (L) 36.0 - 46.0 %    Blood Alcohol level:  Lab Results  Component Value Date   ETH 122 (H) 75/06/2584    Metabolic Disorder Labs: Lab Results  Component Value Date   HGBA1C 5.1 07/26/2017   MPG 120 (H) 11/05/2015   MPG 120 (H) 05/18/2015   No results found for: PROLACTIN Lab Results  Component Value Date   CHOL 168 10/30/2017   TRIG 98 10/30/2017   HDL 60 10/30/2017   CHOLHDL 2.8 10/30/2017   VLDL 18 11/05/2015   LDLCALC 88 10/30/2017   LDLCALC 100 (H) 07/26/2017    Physical Findings: AIMS: Facial and Oral Movements Muscles of Facial Expression: None, normal Lips and Perioral Area: None, normal Jaw: None, normal Tongue: None, normal,Extremity Movements Upper (arms, wrists, hands, fingers): None, normal Lower (legs, knees, ankles, toes): None, normal, Trunk Movements Neck, shoulders, hips: None, normal, Overall Severity Severity of abnormal movements (highest score from questions above): None, normal Incapacitation due to abnormal movements: None, normal Patient's awareness of abnormal movements (rate only patient's report): No Awareness, Dental Status Current problems with teeth and/or dentures?: No Does patient usually wear dentures?: No  CIWA:  CIWA-Ar Total: 0 COWS:  COWS Total Score: 2  Musculoskeletal: Strength & Muscle Tone: within normal limits Gait & Station: normal Patient leans: N/A  Psychiatric Specialty Exam: Physical Exam  ROS no chest pain, no shortness of breath, no cough, no fever, no chills, no vomiting, reports intermittent diarrhea which she attributes to history of bariatric surgery  Blood pressure 106/68, pulse 81, temperature 98.7 F (37.1 C), temperature source Oral, resp. rate 20, height 5' 7"  (1.702 m), weight 88 kg, last menstrual period 06/02/2017, SpO2 100 %.Body  mass index is 30.38 kg/m.  General Appearance: Well Groomed  Eye Contact:  Good  Speech:  Normal Rate  Volume:  Normal  Mood:  Currently denies depression, describes her mood as "okay"  Affect:  Appropriate and Reactive  Thought Process:  Linear and Descriptions of Associations: Intact  Orientation:  Full (Time, Place, and Person)  Thought  Content:  No hallucinations, no delusions  Suicidal Thoughts:  No denies suicidal or self-injurious ideations, denies homicidal or violent ideations  Homicidal Thoughts:  No  Memory:  Recent and remote grossly intact  Judgement:  Other:  Fair/improving  Insight:  Fair/improving  Psychomotor Activity:  Normal-no restlessness or agitation  Concentration:  Concentration: Good and Attention Span: Good  Recall:  Good  Fund of Knowledge:  Good  Language:  Good  Akathisia:  Negative  Handed:  Right  AIMS (if indicated):     Assets:  Communication Skills Desire for Improvement Resilience  ADL's:  Intact  Cognition:  WNL  Sleep:  Number of Hours: 6.5   Assessment-  43 year old female , presented to ED via [facility] after making suicidal threats while she held a knife. Patient reports this was in the context of being acutely upset due to argument and break up with BF, but denies any actual suicidal plan or intention. She also had been drinking on day of event, which she feels contributed to the above. Denies  significant depression or any suicidal ideations prior to event and minimizes neuro-vegetative symptoms. Denies pattern of alcohol abuse and states she rarely drinks. She reports history of chronic pain for which she was in treatment with Gabapentin,  Hydrocodone, Nortriptyline, Cymbalta, but states was only taking Cymbalta regularly. States she last took opiate analgesics 3-4 days ago, and is not currently presenting with or endorsing opiate WDL symptoms.  Patient reports she is feeling well today and minimizes/denies depression.  Affect presents full in  range and reactive.  Presents future oriented.  States that suicidal statements were made impulsively during an argument with her boyfriend and in the context of alcohol intoxication but denies any depression or recent neurovegetative symptoms/suicidal ideations.  Not presenting with symptoms of opiate withdrawal.       Treatment Plan Summary: Daily contact with patient to assess and evaluate symptoms and progress in treatment, Medication management, Plan Inpatient treatment and Medications as below Encouraged group and milieu participation Continue Cymbalta 60 mg twice daily for depression and pain Continue Neurontin 100 mg 3 times daily for pain Continue trazodone 50 mg nightly PRN for insomnia as needed Continue Ativan 1 mg every 6 hours PRN for alcohol withdrawal if needed Treatment team working on disposition [facility] Jenne [facility], MD 04/09/2019, 9:49 AM

## 2019-04-09 NOTE — Plan of Care (Signed)
  Problem: Education: Goal: Utilization of techniques to improve thought processes will improve Outcome: Progressing Goal: Knowledge of the prescribed therapeutic regimen will improve Outcome: Progressing

## 2019-04-09 NOTE — Progress Notes (Signed)
D: Patient observed in bed all evening and was awakened for assessment. Patient complained of back pain at an 8/10. Patient is sleepy and is observed to be asleep on this RN's return to her room. Denies any other physical complaints and denies withdrawal symptoms. COVID-19 screen negative, afebrile. Respiratory assessment WDL.  A: Medicated per orders, prn tyelnol given for discomfort. Medication education provided but patient stated she did not need prn for sleep. Level III obs in place for safety. Emotional support offered. Patient encouraged to complete Suicide Safety Plan before discharge. Encouraged to attend and participate in unit programming.   R: Patient verbalizes understanding of POC. On reassess, patient was asleep. Patient denies SI/HI/AVH and remains safe on level III obs. Will continue to monitor throughout the night.

## 2019-04-10 DIAGNOSIS — R45851 Suicidal ideations: Secondary | ICD-10-CM

## 2019-04-10 MED ORDER — DULOXETINE HCL 60 MG PO CPEP: 60.0000 mg | ORAL_CAPSULE | Freq: Two times a day (BID) | ORAL | 0 refills | Status: AC

## 2019-04-10 MED ORDER — GABAPENTIN 100 MG PO CAPS: 100.0000 mg | ORAL_CAPSULE | Freq: Three times a day (TID) | ORAL | 0 refills | Status: DC

## 2019-04-10 NOTE — Tx Team (Signed)
Interdisciplinary Treatment and Diagnostic Plan Update  04/10/2019 Time of Session: 9:00am [name] MRN: [id number]  Principal Diagnosis: <principal problem not specified>  Secondary Diagnoses: Active Problems:   MDD (major depressive disorder), recurrent severe, without psychosis ([place])   Current Medications:  Current Facility-Administered Medications  Medication Dose Route Frequency Provider Last Rate Last Dose  . acetaminophen (TYLENOL) tablet 650 mg  650 mg Oral Q6H PRN Deloria Lair, NP   650 mg at 04/09/19 1905  . alum & mag hydroxide-simeth (MAALOX/MYLANTA) 200-200-20 MG/5ML suspension 30 mL  30 mL Oral Q4H PRN Deloria Lair, NP      . DULoxetine (CYMBALTA) DR capsule 60 mg  60 mg Oral BID Cobos, Myer Peer, MD   60 mg at 04/10/19 0752  . gabapentin (NEURONTIN) capsule 100 mg  100 mg Oral TID Cobos, Myer Peer, MD   100 mg at 04/10/19 0753  . loperamide (IMODIUM) capsule 2-4 mg  2-4 mg Oral PRN Dixon, Rashaun M, NP      . LORazepam (ATIVAN) tablet 1 mg  1 mg Oral Q6H PRN Deloria Lair, NP      . multivitamin with minerals tablet 1 tablet  1 tablet Oral Daily Deloria Lair, NP   1 tablet at 04/10/19 0753  . ondansetron (ZOFRAN-ODT) disintegrating tablet 4 mg  4 mg Oral Q6H PRN Deloria Lair, NP   4 mg at 04/10/19 0755  . thiamine (B-1) injection 100 mg  100 mg Intramuscular Once Dixon, Rashaun M, NP      . thiamine (VITAMIN B-1) tablet 100 mg  100 mg Oral Daily Dixon, Rashaun M, NP   100 mg at 04/10/19 0754   PTA Medications: Facility-Administered Medications Prior to Admission  Medication Dose Route Frequency Provider Last Rate Last Dose  . cyanocobalamin ((VITAMIN B-12)) injection 1,000 mcg  1,000 mcg Intramuscular Q30 days Wardell Honour, MD   1,000 mcg at 04/22/18 1044  . cyanocobalamin ((VITAMIN B-12)) injection 1,000 mcg  1,000 mcg Intramuscular Q30 days Wardell Honour, MD   1,000 mcg at 07/15/18 6195   Medications Prior to Admission  Medication  Sig Dispense Refill Last Dose  . albuterol (PROVENTIL HFA;VENTOLIN HFA) 108 (90 Base) MCG/ACT inhaler Inhale 2 puffs into the lungs every 6 (six) hours as needed for wheezing or shortness of breath. For shortness of breath 54 g 0 04/07/2019 at Unknown time  . alum & mag hydroxide-simeth (MAALOX/MYLANTA) 200-200-20 MG/5ML suspension Take 30 mLs by mouth every 6 (six) hours as needed for indigestion or heartburn. Mix with 10 ml of lidocaine 355 mL 0 04/07/2019 at Unknown time  . baclofen (LIORESAL) 10 MG tablet Take 0.5 tablets (5 mg total) by mouth at bedtime. 45 each 1 Past Week at Unknown time  . CALCIUM CITRATE PO 10 mLs by Feeding Tube route every other day. In the afternoon.   Past Week at Unknown time  . docusate sodium (COLACE) 100 MG capsule Take 1 capsule (100 mg total) by mouth 2 (two) times daily. 10 capsule 0 Past Week at Unknown time  . DULoxetine HCl 60 MG CSDR Take 60 mg by mouth 2 (two) times a day.   04/08/2019 at Unknown time  . ferrous sulfate (FERROUSUL) 325 (65 FE) MG tablet Take 1 tablet (325 mg total) by mouth daily with breakfast. 90 tablet 3 04/07/2019 at Unknown time  . fluconazole (DIFLUCAN) 150 MG tablet 1 tablet by mouth. May repeat in 24 hours if needed 2 tablet 0 04/07/2019  at Unknown time  . FLUoxetine (PROZAC) 20 MG/5ML solution Place 10 mLs (40 mg total) into feeding tube daily. 360 mL 3 Past Week at Unknown time  . gabapentin (NEURONTIN) 300 MG capsule Take 1 capsule (300 mg total) by mouth 2 (two) times daily AND 2 capsules (600 mg total) at bedtime. (Patient taking differently: Take 1 capsule (300 mg total) by mouth 3 times daily AND 2 capsules (600 mg total) at bedtime.) 120 capsule 3 Past Week at Unknown time  . HYDROcodone-acetaminophen (NORCO) 10-325 MG tablet Take 1 tablet by mouth every 6 (six) hours as needed.   Past Month at Unknown time  . lidocaine (XYLOCAINE) 2 % solution Use as directed 10 mLs in the mouth or throat every 6 (six) hours as needed for mouth pain.  Mix with 30 ml of mylanta 100 mL 0 Past Month at Unknown time  . linaclotide (LINZESS) 290 MCG CAPS capsule Take 290 mcg by mouth daily before breakfast.   Past Week at Unknown time  . methocarbamol (ROBAXIN) 500 MG tablet Take 1 tablet (500 mg total) by mouth every 6 (six) hours as needed for muscle spasms. 40 tablet 0 04/07/2019 at Unknown time  . metoCLOPramide (REGLAN) 10 MG tablet Take 1 tablet (10 mg total) by mouth 3 (three) times daily before meals. 90 tablet 5 Past Week at Unknown time  . Multiple Vitamin (MULTIVITAMIN) LIQD Place 10 mLs into feeding tube daily. Bariatric Liquid Multivitamin    Past Week at Unknown time  . nitrofurantoin, macrocrystal-monohydrate, (MACROBID) 100 MG capsule Take 1 capsule (100 mg total) by mouth 2 (two) times daily. 14 capsule 0 Past Week at Unknown time  . nortriptyline (PAMELOR) 10 MG capsule START TAKING 1 TABLET AT BEDTIME X 1 WEEK, THEN TAKE 2 TABLETS AT BEDTIME X 1 WEEK, THEN CAN TAKE 3 TABLETS AT BEDTIME 270 capsule 1 Past Week at Unknown time  . Nutritional Supplements (FEEDING SUPPLEMENT, OSMOLITE 1.2 CAL,) LIQD Place 1,000 mLs into feeding tube continuous.   Past Week at Unknown time  . nystatin (MYCOSTATIN) 100000 UNIT/ML suspension Take 5 mLs (500,000 Units total) by mouth 4 (four) times daily. Swish and swallow 120 mL 0 04/07/2019 at Unknown time  . ondansetron (ZOFRAN-ODT) 8 MG disintegrating tablet Take 1 tablet (8 mg total) by mouth every 8 (eight) hours as needed for nausea. 20 tablet 5 04/07/2019 at Unknown time  . pantoprazole (PROTONIX) 40 MG tablet Take 1 tablet (40 mg total) by mouth 2 (two) times daily before a meal. 180 tablet 1 Past Week at Unknown time  . polyethylene glycol (MIRALAX / GLYCOLAX) packet Take 17 g by mouth 2 (two) times daily. 14 each 0 Past Week at Unknown time  . thiamine (VITAMIN B-1) 100 MG tablet Take 100 mg by mouth daily at 12 noon.    Past Week at Unknown time  . Vitamin D, Ergocalciferol, (DRISDOL) 50000 units CAPS  capsule Take 1 capsule (50,000 Units total) by mouth every 7 (seven) days. 12 capsule 0 Past Week at Unknown time    Patient Stressors: Educational concerns Financial difficulties Health problems Occupational concerns  Patient Strengths: Ability for insight Active sense of humor Average or above average intelligence Capable of independent living  Treatment Modalities: Medication Management, Group therapy, Case management,  1 to 1 session with clinician, Psychoeducation, Recreational therapy.   Physician Treatment Plan for Primary Diagnosis: <principal problem not specified> Long Term Goal(s): Improvement in symptoms so as ready for discharge Improvement in symptoms so as  ready for discharge   Short Term Goals: Ability to identify changes in lifestyle to reduce recurrence of condition will improve Ability to verbalize feelings will improve Ability to disclose and discuss suicidal ideas Ability to demonstrate self-control will improve Ability to identify and develop effective coping behaviors will improve Ability to maintain clinical measurements within normal limits will improve Compliance with prescribed medications will improve Ability to identify changes in lifestyle to reduce recurrence of condition will improve Ability to verbalize feelings will improve Ability to disclose and discuss suicidal ideas Ability to demonstrate self-control will improve Ability to identify and develop effective coping behaviors will improve Ability to maintain clinical measurements within normal limits will improve Compliance with prescribed medications will improve  Medication Management: Evaluate patient's response, side effects, and tolerance of medication regimen.  Therapeutic Interventions: 1 to 1 sessions, Unit Group sessions and Medication administration.  Evaluation of Outcomes: Adequate for Discharge  Physician Treatment Plan for Secondary Diagnosis: Active Problems:   MDD (major  depressive disorder), recurrent severe, without psychosis ([place])  Long Term Goal(s): Improvement in symptoms so as ready for discharge Improvement in symptoms so as ready for discharge   Short Term Goals: Ability to identify changes in lifestyle to reduce recurrence of condition will improve Ability to verbalize feelings will improve Ability to disclose and discuss suicidal ideas Ability to demonstrate self-control will improve Ability to identify and develop effective coping behaviors will improve Ability to maintain clinical measurements within normal limits will improve Compliance with prescribed medications will improve Ability to identify changes in lifestyle to reduce recurrence of condition will improve Ability to verbalize feelings will improve Ability to disclose and discuss suicidal ideas Ability to demonstrate self-control will improve Ability to identify and develop effective coping behaviors will improve Ability to maintain clinical measurements within normal limits will improve Compliance with prescribed medications will improve     Medication Management: Evaluate patient's response, side effects, and tolerance of medication regimen.  Therapeutic Interventions: 1 to 1 sessions, Unit Group sessions and Medication administration.  Evaluation of Outcomes: Adequate for Discharge   RN Treatment Plan for Primary Diagnosis: <principal problem not specified> Long Term Goal(s): Knowledge of disease and therapeutic regimen to maintain health will improve  Short Term Goals: Ability to demonstrate self-control and Ability to identify and develop effective coping behaviors will improve  Medication Management: RN will administer medications as ordered by provider, will assess and evaluate patient's response and provide education to patient for prescribed medication. RN will report any adverse and/or side effects to prescribing provider.  Therapeutic Interventions: 1 on 1 counseling  sessions, Psychoeducation, Medication administration, Evaluate responses to treatment, Monitor vital signs and CBGs as ordered, Perform/monitor CIWA, COWS, AIMS and Fall Risk screenings as ordered, Perform wound care treatments as ordered.  Evaluation of Outcomes: Adequate for Discharge   LCSW Treatment Plan for Primary Diagnosis: <principal problem not specified> Long Term Goal(s): Safe transition to appropriate next level of care at discharge, Engage patient in therapeutic group addressing interpersonal concerns.  Short Term Goals: Engage patient in aftercare planning with referrals and resources, Increase social support, Identify triggers associated with mental health/substance abuse issues and Increase skills for wellness and recovery  Therapeutic Interventions: Assess for all discharge needs, 1 to 1 time with Social worker, Explore available resources and support systems, Assess for adequacy in community support network, Educate family and significant other(s) on suicide prevention, Complete Psychosocial Assessment, Interpersonal group therapy.  Evaluation of Outcomes: Adequate for Discharge   Progress in Treatment:  Attending groups: Yes. Participating in groups: Yes. Taking medication as prescribed: Yes. Toleration medication: Yes. Family/Significant other contact made: No, will contact:  declines SPE consents, will be reviewed with patient. Patient understands diagnosis: Yes. Discussing patient identified problems/goals with staff: Yes. Medical problems stabilized or resolved: Yes. Denies suicidal/homicidal ideation: Yes. Issues/concerns per patient self-inventory: No.  New problem(s) identified: Yes, Describe:  family conflict  New Short Term/Long Term Goal(s): detox, medication management for mood stabilization; elimination of SI thoughts; development of comprehensive mental wellness/sobriety plan.  Patient Goals:  Discharge  Discharge Plan or Barriers: Discharging home,  following up with her outpatient PCP  Reason for Continuation of Hospitalization: Anxiety Depression  Estimated Length of Stay: discharge today  Attendees: Patient: 04/10/2019 8:38 AM  Physician: Larene Beach 04/10/2019 8:38 AM  Nursing: Rise Paganini, [place] 04/10/2019 8:38 AM  RN Care Manager: 04/10/2019 8:38 AM  Social Worker: Stephanie Acre, [place] 04/10/2019 8:38 AM  Recreational Therapist:  04/10/2019 8:38 AM  Other:  04/10/2019 8:38 AM  Other:  04/10/2019 8:38 AM  Other: 04/10/2019 8:38 AM    Scribe for Treatment Team: Joellen Jersey, [place] 04/10/2019 8:38 AM

## 2019-04-10 NOTE — Progress Notes (Signed)
D:  Patient denied SI and HI, contracts for safety.  Denied A/V hallucinations.   A:  Medications administered per MD orders.  Emotional support and encouragement given patient. R:  Safety maintained with 15 minute checks.  

## 2019-04-10 NOTE — Progress Notes (Signed)
D: Pt denies SI/HI/AV hallucinations. Pt is pleasant and cooperative. Pt observed in milieu interacting with peers.  A: Pt was offered support and encouragement.  Pt was encourage to attend groups. Q 15 minute checks were done for safety.  R:Pt attends groups and interacts well with peers and staff. Pt is taking medication. Pt has no complaints.Pt receptive to treatment and safety maintained on unit.

## 2019-04-10 NOTE — [facility] Suicide Risk Assessment (Signed)
[facility] Discharge Suicide Risk Assessment   Principal Problem: Depression Discharge Diagnoses: Active Problems:   MDD (major depressive disorder), recurrent severe, without psychosis ([place])   Total Time spent with patient: 30 minutes  Musculoskeletal: Strength & Muscle Tone: within normal limits Gait & Station: normal Patient leans: N/A  Psychiatric Specialty Exam: ROS no headache, no dysphagia,  no chest pain , no shortness of breath, no cough, reports mild nausea, no fever, no chills , no rash  Blood pressure 107/84, pulse 97, temperature 98.2 F (36.8 C), temperature source Oral, resp. rate 16, height 5' 7"  (1.702 m), weight 88 kg, last menstrual period 06/02/2017, SpO2 100 %.Body mass index is 30.38 kg/m.  General Appearance: Well Groomed  Eye Contact::  Good  Speech:  Normal Rate409  Volume:  Normal  Mood:  reports improved mood , and describes it as 10/10 with 10 being best   Affect:  Appropriate and full in range   Thought Process:  Linear and Descriptions of Associations: Intact  Orientation:  Full (Time, Place, and Person)  Thought Content:  no hallucinations,no delusions, not internally preoccupied   Suicidal Thoughts:  No denies suicidal or self injurious ideations, denies homicidal or violent ideations  Homicidal Thoughts:  No  Memory:  recent and remote grossly intact   Judgement:  Improving   Insight:  fair, improving   Psychomotor Activity:  Normal  Concentration:  Good  Recall:  Good  Fund of Knowledge:Good  Language: Good  Akathisia:  Negative  Handed:  Right  AIMS (if indicated):     Assets:  Communication Skills Desire for Improvement Resilience  Sleep:  Number of Hours: 6.25  Cognition: WNL  ADL's:  Intact   Mental Status Per Nursing Assessment::   On Admission:  Suicidal ideation indicated by patient  Demographic Factors:  43, separated, has two children,currently on disability  Loss Factors: Recent argument /break up   Historical Factors: No  prior psychiatric admissions, no prior history of suicidal attempts or other self injurious ideations, history of depression  Risk Reduction Factors:   Responsible for children under 67 years of age, Sense of responsibility to family, Employed, Living with another person, especially a relative and Positive coping skills or problem solving skills  Continued Clinical Symptoms:  At this time patient is alert, attentive, well groomed, presents calm , pleasant and with a full range of affect . No thought disorder, no suicidal or self injurious ideations, future oriented, no homicidal or violent ideations, no hallucinations, no delusions, oriented x 3. Of note, patient reports that yesterday evening she felt an incipient rash and subjective sense of  dysphagia after taking Trazodone . She was given Benadryl and Epipen. Symptoms now completely resolved , and states states she slept comfortably through the night. Currently no rash, no dyspnea at room air, no dysphagia, vitals are stable, pulse 97. BP 107/74, RR 14 . Temp 98.2.  It is not felt that reaction described above was related to Cymbalta , Neurontin, or Zofran as patient has been on these medications prior to admission without side effects. I have asked patient to add Trazodone  to allergy list .  Cognitive Features That Contribute To Risk:  No gross cognitive deficits noted upon discharge. Is alert , attentive, and oriented x 3   Suicide Risk:  No gross cognitive deficits noted upon discharge. Is alert , attentive, and oriented x 3     Plan Of Care/Follow-up recommendations:  Activity:  as tolerated  Diet:  regular Tests:  NA Other:  See below  Patient is expressing readiness for discharge and there are no current grounds for involuntary commitment  She is leaving unit in good spirits  Plans to follow up for outpatient psychiatric services and plans to follow up with her PCP , Dr. Pamella Pert, at [facility],  MD 04/10/2019, 8:21 AM

## 2019-04-10 NOTE — Progress Notes (Signed)
Patient is discharging within 36 hours of admission, as such, a PSA was not completed with patient.  She declines consents for SPE. Denies SI and HI. Pamphlet placed on chart for patient to review and share with supports.  Patient reports that she is established with a psychologist, and she will schedule her own follow up appointments. She politely declines other referrals.  Stephanie Acre, LCSW-A Clinical Social Worker

## 2019-04-10 NOTE — Progress Notes (Signed)
Discharge Note:  Patient discharged with family member.  Patient denied SI and HI.  Denied A/V hallucinations.  Suicide prevention information given and discussed with patient who stated she understood and had no questions.  Patient stated she received all her belongings, clothing, toiletries, misc items, etc.  Patient stated she appreciated all assistance received from [facility] staff.  All required discharge information given to patient at discharge.

## 2019-04-10 NOTE — Discharge Summary (Addendum)
Physician Discharge Summary Note  Patient:  [name] is an 43 y.o., female MRN:  [id number] DOB:  10/20/1975 Patient phone:  [phone] (home)  Patient address:   [place] [address],  Total Time spent with patient: 15 minutes  Date of Admission:  04/08/2019 Date of Discharge: 04/10/19  Reason for Admission:  suicidal ideation  Principal Problem: <principal problem not specified> Discharge Diagnoses: Active Problems:   MDD (major depressive disorder), recurrent severe, without psychosis ([place])   Suicidal ideations   Past Psychiatric History: no prior psychiatric admissions, denies history of suicidal attempts or history of self injurious behaviors, she reports history of depression, states it has been well controlled on Cymbalta.Denies history of mania.  Denies history of psychosis, denies PTSD, denies panic or agoraphobia.  Past Medical History:  Past Medical History:  Diagnosis Date  . Anal fissure - posterior 10/16/2014    OCC  ISSUES  . Anxiety    doesn't take anything  . Asthma    has Albuterol inhaler as needed  . Boil    on pubic area started septra 03-01-17 draining blood and pus  . Bronchitis   . Chronic headache disorder 07/22/2016  . Clostridium difficile infection 04/20/2012  . Colitis   . Dehydration   . Depression   . Family history of adverse reaction to anesthesia    pt mom gets sick  . GERD (gastroesophageal reflux disease)    takes Pantoprazole daily  . Headache   . Hiatal hernia    neuropathy - mild in arms and legs  . History of blood transfusion    last transfusion was 04/04/2016=Benadryl was given d/t itching. States she always itches with transfusion.   Marland Kitchen History of bronchitis    > 2 yrs ago  . History of colon polyps    benign  . History of migraine    last one 05/01/16  . History of stomach ulcers   . History of urinary tract infection LAST 2 WEEKS AGO  . IDA (iron deficiency anemia)   . Internal and external  bleeding hemorrhoids 06/11/2014  . Joint pain   . Joint swelling   . Left sided chronic colitis - segmental 06/11/2014  . Marginal ulcer 10/25/2017  . Migraine   . Motion sickness   . Nausea    takes Zofran as needed  . Nausea and vomiting    for 1 year  . Obesity   . Oligouria   . Osteoarthritis    lower back, knees, wrists - no meds  . Peripheral neuropathy 03/01/2019  . Pneumonia 1997  . Postoperative nausea and vomiting 01/21/2016   wants scopolamine patch  . Right carpal tunnel syndrome 03/01/2019  . SVD (spontaneous vaginal delivery)    x 4  . Tingling    BOTH LOWER EXTREMETIES ALL THE TIME DUE TO RAPID WEIGHT LOSS  . TOBACCO USER 10/02/2009   Qualifier: Diagnosis of  By: Dimas Millin MD, Ellard Artis    . Transfusion history    last transfusion 6'16   . Tremor 08/31/2018  . UC (ulcerative colitis) ([place])    supposed to be taking Lialda and Bentyl but has been off since gastric sleeve  . Vertigo    doesn't take any meds    Past Surgical History:  Procedure Laterality Date  . ABDOMINAL HYSTERECTOMY     PARTIAL  . COLONOSCOPY  2007   for rectal bleeding; [facility]  . COLONOSCOPY N/A 06/11/2014   Procedure: COLONOSCOPY;  Surgeon:  Gatha Mayer, MD;  Location: [facility];  Service: Endoscopy;  Laterality: N/A;  . COLONOSCOPY WITH PROPOFOL N/A 03/02/2017   Procedure: COLONOSCOPY WITH PROPOFOL;  Surgeon: Alphonsa Overall, MD;  Location: [facility];  Service: General;  Laterality: N/A;  . DILATION AND CURETTAGE OF UTERUS    . ESOPHAGOGASTRODUODENOSCOPY (EGD) WITH PROPOFOL N/A 04/06/2016   Procedure: ESOPHAGOGASTRODUODENOSCOPY (EGD) WITH PROPOFOL;  Surgeon: Alphonsa Overall, MD;  Location: [facility];  Service: General;  Laterality: N/A;  . ESOPHAGOGASTRODUODENOSCOPY (EGD) WITH PROPOFOL N/A 03/02/2017   Procedure: ESOPHAGOGASTRODUODENOSCOPY (EGD) WITH PROPOFOL;  Surgeon: Alphonsa Overall, MD;  Location: [facility];  Service: General;  Laterality: N/A;  . ESOPHAGOGASTRODUODENOSCOPY (EGD) WITH PROPOFOL  N/A 05/20/2017   Procedure: ESOPHAGOGASTRODUODENOSCOPY (EGD) WITH PROPOFOL ERAS PATHWAY;  Surgeon: Alphonsa Overall, MD;  Location: [facility];  Service: General;  Laterality: N/A;  . ESOPHAGOGASTRODUODENOSCOPY (EGD) WITH PROPOFOL N/A 10/07/2017   Procedure: ESOPHAGOGASTRODUODENOSCOPY (EGD) WITH PROPOFOL;  Surgeon: Alphonsa Overall, MD;  Location: [facility];  Service: General;  Laterality: N/A;  . EXCISION OF SKIN TAG  06/08/2017   Procedure: EXCISION OF VULVAR SKIN TAGS X2;  Surgeon: [name], MD;  Location: [facility];  Service: Gynecology;;  . FOOT SURGERY Bilateral    x 2  . GASTRIC ROUX-EN-Y N/A 12/14/2016   Procedure: LAPAROSCOPIC REVISION SLEEVE GASTRECTOMY TO  ROUX-Y-GASTRIC BY-PASS, UPPER ENDO;  Surgeon: Excell Seltzer, MD;  Location: [facility];  Service: General;  Laterality: N/A;  . GASTROJEJUNOSTOMY N/A 05/11/2016   Procedure: LAPAROSCOPIC PLACEMENT  OF FEEDING  JEJUNOSTOMY TUBE;  Surgeon: Excell Seltzer, MD;  Location: [facility];  Service: General;  Laterality: N/A;  . HEMORRHOIDECTOMY WITH HEMORRHOID BANDING    . IR FLUORO GUIDE CV LINE RIGHT  04/06/2017  . IR GENERIC HISTORICAL  05/19/2016   IR CM INJ ANY COLONIC TUBE W/FLUORO 05/19/2016 Aletta Edouard, MD [facility]  . IR PATIENT EVAL TECH 0-60 MINS  11/30/2017  . IR REPLC DUODEN/JEJUNO TUBE PERCUT W/FLUORO  03/14/2018  . IR REPLC DUODEN/JEJUNO TUBE PERCUT W/FLUORO  03/15/2018  . IR US GUIDE VASC ACCESS RIGHT  04/06/2017  . j tube removed march 2018    . KNEE ARTHROSCOPY Left 09/06/2014  . LAPAROSCOPIC GASTRIC SLEEVE RESECTION N/A 01/14/2016   Procedure: LAPAROSCOPIC GASTRIC SLEEVE RESECTION;  Surgeon: Excell Seltzer, MD;  Location: [facility];  Service: General;  Laterality: N/A;  . LAPAROSCOPIC REVISION OF GASTROJEJUNOSTOMY N/A 10/25/2017   Procedure: LAPAROSCOPIC REVISION OF GASTROJEJUNOSTOMY AND PARTIAL GASTRECTOMY, WITH PLACEMENT OF FEEDING GASTROSTOMY TUBE;  Surgeon: Excell Seltzer, MD;  Location: [facility];  Service: General;   Laterality: N/A;  . LAPAROSCOPIC TUBAL LIGATION  10/16/2011   Procedure: LAPAROSCOPIC TUBAL LIGATION;  Surgeon: Alwyn Pea, MD;  Location: [facility];  Service: Gynecology;  Laterality: Bilateral;  . NOVASURE ABLATION  09/28/2010   mild persistent vaginal bleeding  . right knee arthroscopy     05/07/2016  . TOTAL KNEE ARTHROPLASTY Left 03/10/2018   Procedure: LEFT TOTAL KNEE ARTHROPLASTY;  Surgeon: Paralee Cancel, MD;  Location: [facility];  Service: Orthopedics;  Laterality: Left;  70 mins  . TUBAL LIGATION    . VAGINAL HYSTERECTOMY Bilateral 06/08/2017   Procedure: HYSTERECTOMY VAGINAL W/ BILATERAL SALPINGECTOMY;  Surgeon: [name], MD;  Location: [facility];  Service: Gynecology;  Laterality: Bilateral;  . wisdom teeth extracted     Family History:  Family History  Problem Relation Age of Onset  . Hypertension Mother   . Diabetes Mother   . Sarcoidosis Mother  lungs and skin  . Asthma Mother   . Hypertension Father   . Diabetes Father   . Asthma Son   . Tics Son   . Asthma Sister   . Cancer Sister        possible pancreatic cancer  . Adrenal disorder Sister        Tumor   . Asthma Brother   . Asthma Daughter        died age 29.5  . Cancer Daughter 4       brain; died age 29.5  . Asthma Son   . Cancer Paternal Aunt        brain, colon, lung and esophagus; unsure of primary   . Breast cancer Paternal Aunt    Family Psychiatric  History: states that her children have history of depression related to their father passing away in 2011. No suicides in family. Paternal Aunt alcoholic . Social History:  Social History   Substance and Sexual Activity  Alcohol Use Yes  . Alcohol/week: 0.0 standard drinks     Social History   Substance and Sexual Activity  Drug Use No    Social History   Socioeconomic History  . Marital status: Single    Spouse name: Not on file  . Number of children: 3  . Years of education: Some college  . Highest education level: Not on file   Occupational History  . Occupation: Production designer, theatre/television/film    Comment: Currently out on disability d/t surg  Social Needs  . Financial resource strain: Not hard at all  . Food insecurity    Worry: Never true    Inability: Never true  . Transportation needs    Medical: No    Non-medical: No  Tobacco Use  . Smoking status: Former Smoker    Packs/day: 0.50    Years: 20.00    Pack years: 10.00    Types: Cigarettes    Quit date: 03/17/2014    Years since quitting: 5.0  . Smokeless tobacco: Never Used  Substance and Sexual Activity  . Alcohol use: Yes    Alcohol/week: 0.0 standard drinks  . Drug use: No  . Sexual activity: Not Currently    Birth control/protection: Surgical, Abstinence  Lifestyle  . Physical activity    Days per week: 0 days    Minutes per session: 0 min  . Stress: To some extent  Relationships  . Social Herbalist on phone: Once a week    Gets together: Once a week    Attends religious service: Never    Active member of club or organization: No    Attends meetings of clubs or organizations: Not on file    Relationship status: Separated  Other Topics Concern  . Not on file  Social History Narrative   Marital status:  Single; not dating seriously.      Children:  3 sons (23, 16, 38) whose father is deceased, 1 daughter (deceased age 54); no grandchildren      Lives:  With 2 sons      Employment:  Long term disability in 2018 due to PICC line for iv fluids; Corporate treasurer at [employer]      Tobacco: quit June 2015      Alcohol:  None      Drugs:  None      Exercise:  Leg lifts for knee strengthening; walking   1 caffeine beverage daily      Sexual  activity:  Active; total partners = up there.  No STDs in past.        Right-handed    Hospital Course:  From admission H&P: 43 year old female, presented to ED via [facility] ( called by family) due to making suicidal threats while she held a knife . States that this was in relation to argument and  break up with BF of several years . States " I said it because I was angry, frustrated, I did not mean it, I have my kids to live for". She acknowledges she had been drinking on day of event, and states she feels alcohol contributed to her making statements and acting more impulsively. She denies alcohol abuse and states she rarely drinks. Patient reports she has been prescribed opiates x about a year, for chronic pain. States she was taking less than prescribed and on some days not at all. Admission BAL 122, and UDS was positive for BZD ( negative for opiates ) . Patient denies any  BZD use . She states she has not taken opiate analgesics for 3-4 days. Denies current symptoms of opiate WDL. She states she had been doing well prior to the above, and denies recent depression or worsening mood . States " I just said something I should not have because I was upset ". Does not endorse significant neuro-vegetative symptoms.  Ms. Galdamez was admitted after making suicidal statements while holding a knife and consuming alcohol. She remained on the [facility] unit for two days. Admission BAL 122 and UDS positive for BZDs but she denied BZD use. She was restarted on Cymbalta and Neurontin. She participated in group therapy on the unit. She responded well to treatment with no adverse effects reported. She has shown improved mood, affect, sleep, and interaction. On day of discharge, she is alert, calm and reports good mood. She is future-oriented. She denies any SI/HI/AVH and contracts for safety. She denies withdrawal symptoms. She declines referrals for follow-up, saying that she will follow up on her own with her current providers. She is provided with prescriptions for medications upon discharge. Her friend is picking her up for discharge home.  Physical Findings: AIMS: Facial and Oral Movements Muscles of Facial Expression: None, normal Lips and Perioral Area: None, normal Jaw: None, normal Tongue: None, normal,Extremity  Movements Upper (arms, wrists, hands, fingers): None, normal Lower (legs, knees, ankles, toes): None, normal, Trunk Movements Neck, shoulders, hips: None, normal, Overall Severity Severity of abnormal movements (highest score from questions above): None, normal Incapacitation due to abnormal movements: None, normal Patient's awareness of abnormal movements (rate only patient's report): No Awareness, Dental Status Current problems with teeth and/or dentures?: No Does patient usually wear dentures?: No  CIWA:  CIWA-Ar Total: 1 COWS:  COWS Total Score: 2  Musculoskeletal: Strength & Muscle Tone: within normal limits Gait & Station: normal Patient leans: N/A  Psychiatric Specialty Exam: Physical Exam  Nursing note and vitals reviewed. Constitutional: She is oriented to person, place, and time. She appears well-developed and well-nourished.  Cardiovascular: Normal rate.  Respiratory: Effort normal.  Neurological: She is alert and oriented to person, place, and time.    Review of Systems  Constitutional: Negative.   Respiratory: Negative for cough and shortness of breath.   Cardiovascular: Negative for chest pain.  Gastrointestinal: Negative for diarrhea, nausea and vomiting.  Neurological: Negative for tremors, sensory change and headaches.  Psychiatric/Behavioral: Positive for depression (stable on medication) and substance abuse. Negative for hallucinations and suicidal ideas. The patient  is not nervous/anxious and does not have insomnia.     Blood pressure 107/84, pulse 97, temperature 98.2 F (36.8 C), temperature source Oral, resp. rate 16, height 5' 7"  (1.702 m), weight 88 kg, last menstrual period 06/02/2017, SpO2 100 %.Body mass index is 30.38 kg/m.  See MD's discharge SRA     Have you used any form of tobacco in the last 30 days? (Cigarettes, Smokeless Tobacco, Cigars, and/or Pipes): Yes  Has this patient used any form of tobacco in the last 30 days? (Cigarettes,  Smokeless Tobacco, Cigars, and/or Pipes)  No  Blood Alcohol level:  Lab Results  Component Value Date   ETH 122 (H) 79/89/2119    Metabolic Disorder Labs:  Lab Results  Component Value Date   HGBA1C 5.1 07/26/2017   MPG 120 (H) 11/05/2015   MPG 120 (H) 05/18/2015   No results found for: PROLACTIN Lab Results  Component Value Date   CHOL 168 10/30/2017   TRIG 98 10/30/2017   HDL 60 10/30/2017   CHOLHDL 2.8 10/30/2017   VLDL 18 11/05/2015   LDLCALC 88 10/30/2017   LDLCALC 100 (H) 07/26/2017    See Psychiatric Specialty Exam and Suicide Risk Assessment completed by Attending Physician prior to discharge.  Discharge destination:  Home  Is patient on multiple antipsychotic therapies at discharge:  No   Has Patient had three or more failed trials of antipsychotic monotherapy by history:  No  Recommended Plan for Multiple Antipsychotic Therapies: NA  Discharge Instructions    Discharge instructions   Complete by: As directed    Patient is instructed to take all prescribed medications as recommended. Report any side effects or adverse reactions to your outpatient psychiatrist. Patient is instructed to abstain from alcohol and illegal drugs while on prescription medications. In the event of worsening symptoms, patient is instructed to call the crisis hotline, 911, or go to the nearest emergency department for evaluation and treatment.     Allergies as of 04/10/2019      Reactions   Coconut Oil Anaphylaxis, Itching   Morphine And Related Anaphylaxis, Hives   Pt states that she has tolerated Norco and Dilaudid   Oxycodone Anaphylaxis   Ciprofloxacin Itching, Rash   Trazodone And Nefazodone Rash   Vistaril [hydroxyzine Hcl] Hives   Doxycycline Nausea And Vomiting   Adhesive [tape] Rash   And paper tape causes a rash if wearing for a prolong period of time   Penicillins Rash   Has patient had a PCN reaction causing immediate rash, facial/tongue/throat swelling, SOB or  lightheadedness with hypotension: Yes Has patient had a PCN reaction causing severe rash involving mucus membranes or skin necrosis: No Has patient had a PCN reaction that required hospitalization No Has patient had a PCN reaction occurring within the last 10 years: No If all of the above answers are "NO", then may proceed with Cephalosporin use.      Medication List    STOP taking these medications   albuterol 108 (90 Base) MCG/ACT inhaler Commonly known as: VENTOLIN HFA   alum & mag hydroxide-simeth 417-408-14 MG/5ML suspension Commonly known as: MAALOX/MYLANTA   baclofen 10 MG tablet Commonly known as: LIORESAL   CALCIUM CITRATE PO   docusate sodium 100 MG capsule Commonly known as: Colace   DULoxetine HCl 60 MG Csdr Replaced by: DULoxetine 60 MG capsule   feeding supplement (OSMOLITE 1.2 CAL) Liqd   ferrous sulfate 325 (65 FE) MG tablet Commonly known as: FerrouSul   fluconazole 150 MG  tablet Commonly known as: DIFLUCAN   FLUoxetine 20 MG/5ML solution Commonly known as: PROZAC   HYDROcodone-acetaminophen 10-325 MG tablet Commonly known as: NORCO   lidocaine 2 % solution Commonly known as: XYLOCAINE   Linzess 290 MCG Caps capsule Generic drug: linaclotide   methocarbamol 500 MG tablet Commonly known as: Robaxin   metoCLOPramide 10 MG tablet Commonly known as: REGLAN   multivitamin Liqd   nitrofurantoin (macrocrystal-monohydrate) 100 MG capsule Commonly known as: MACROBID   nortriptyline 10 MG capsule Commonly known as: PAMELOR   nystatin 100000 UNIT/ML suspension Commonly known as: MYCOSTATIN   ondansetron 8 MG disintegrating tablet Commonly known as: ZOFRAN-ODT   pantoprazole 40 MG tablet Commonly known as: PROTONIX   polyethylene glycol 17 g packet Commonly known as: MIRALAX / GLYCOLAX   Vitamin D (Ergocalciferol) 1.25 MG (50000 UT) Caps capsule Commonly known as: DRISDOL     TAKE these medications     Indication  DULoxetine 60 MG  capsule Commonly known as: CYMBALTA Take 1 capsule (60 mg total) by mouth 2 (two) times daily. Replaces: DULoxetine HCl 60 MG Csdr  Indication: Major Depressive Disorder   gabapentin 100 MG capsule Commonly known as: NEURONTIN Take 1 capsule (100 mg total) by mouth 3 (three) times daily. What changed:   medication strength  See the new instructions.  Indication: Abuse or Misuse of Alcohol   thiamine 100 MG tablet Commonly known as: VITAMIN B-1 Take 100 mg by mouth daily at 12 noon.  Indication: Supplementation      Follow-up Information    Patient will schedule her own follow up. Call.   Contact information: Patient reports that she is established with a psychologist, and she will schedule her own follow up appointments. Declines other referrals.          Follow-up recommendations: Activity as tolerated. Diet as recommended by primary care physician. Keep all scheduled follow-up appointments as recommended.   Comments:   Patient is instructed to take all prescribed medications as recommended. Report any side effects or adverse reactions to your outpatient psychiatrist. Patient is instructed to abstain from alcohol and illegal drugs while on prescription medications. In the event of worsening symptoms, patient is instructed to call the crisis hotline, 911, or go to the nearest emergency department for evaluation and treatment.  Signed: Connye Burkitt, NP 04/10/2019, 3:29 PM   Patient seen, Suicide Assessment Completed.  Disposition Plan Reviewed

## 2019-04-10 NOTE — Plan of Care (Signed)
  Problem: Education: Goal: Knowledge of the prescribed therapeutic regimen will improve Outcome: Progressing   Problem: Coping: Goal: Coping ability will improve Outcome: Progressing   Problem: Self-Concept: Goal: Level of anxiety will decrease Outcome: Progressing

## 2019-04-10 NOTE — Progress Notes (Signed)
Patient assessed post Epi-Pen medication. Patient states she is able to swallow and was given a small amount of juice and swallowed without difficulty. Patient vitals 97.7-100/67-73-20-100%. Patient respirations even and non labored. Patient in no acute distress. Patient reassured and monitoring continues as she sleeps.

## 2019-04-10 NOTE — Progress Notes (Signed)
Patient stated she ate small amount of breakfast this morning.  Vomited less than 5 minutes after breakfast.  Stated she takes protonix at home which she has not had while being at [facility].

## 2019-04-10 NOTE — Progress Notes (Signed)
  [facility] Adult Case Management Discharge Plan :  Will you be returning to the same living situation after discharge:  Yes,  home At discharge, do you have transportation home?: Yes,  friend picking up Do you have the ability to pay for your medications: Yes,  [employer]  Release of information consent forms completed and in the chart. SPE pamphlet on chart.  Patient to Follow up at: Follow-up Information    Patient will schedule her own follow up. Call.   Contact information: Patient reports that she is established with a psychologist, and she will schedule her own follow up appointments. Declines other referrals.          Next level of care provider has access to [place] and Suicide Prevention discussed: Yes,  with patient, declined consents  Have you used any form of tobacco in the last 30 days? (Cigarettes, Smokeless Tobacco, Cigars, and/or Pipes): Yes  Has patient been referred to the Quitline?: Patient refused referral  Patient has been referred for addiction treatment: Pt. refused referral  Joellen Jersey, [place] 04/10/2019, 9:11 AM

## 2019-05-03 DIAGNOSIS — Z0271 Encounter for disability determination: Secondary | ICD-10-CM

## 2019-05-03 NOTE — Progress Notes (Signed)
PATIENT: [name] DOB: 08/26/1976  REASON FOR VISIT: follow up HISTORY FROM: patient  HISTORY OF PRESENT ILLNESS: Today 05/04/19  [name] is a 43 year old female with history of headache, mild resting tremor in the right arm, and paresthesias in the right arm.  She had an EMG nerve conduction evaluation in June 2020.  Nerve conductions show improvement from the prior study in 2016 but the patient still has a mild primarily motor peripheral neuropathy.  She had EMG of the left leg showing distal chronic denervation consistent with a peripheral neuropathy.  She is started on baclofen for frequent nocturnal leg cramps.  In July 2020 for she had a psychiatric admission for overdose, after she swallowed a bag of marijuana. She says the neuropathy is about the same. She will wake up in the middle of the night with leg cramps. In June 8th she had right rotator cuff repair, carpal tunnel release. She says she will get migraines everyday. She says it may last a few hours. She doesn't take anything for the headaches. She may feel the pain right temporally, and retro-orbitally. She confirms photophobia. She is nauseated all the time, but she has stomach issues. She denies any new numbness or weakness in arms or legs. She says she may lose her balance at random times. Sometimes she may not lift her foot up high enough and she may trip. She says she has fallen 3 times this week. She does see pain management for neck and back pain.  She denies any new numbness or weakness in her arms or legs.  She denies any changes to the bowel or bladder. She says she is no longer having tremor. She presents for follow-up unaccompanied.  HISTORY January 02, 2019 SS: [name] is a 43 year old female with history of headache, mild resting tremor in the right arm, paresthesias right arm. She was started on nortriptyline for insomnia, headache, neuromuscular discomfort.  She reports the nortriptyline was beneficial however  she ran out of the medication about a month ago.  She reports with the medication she saw improvement in her sleep and her headaches.  She reports that she now has daily headaches, mostly on the right side of her head, retro-orbital, about 1-2 bad headaches per month.  She reports photosensitivity, photosensitivity.  She is already taking 10-325 Norco every 6 hours.  When she gets a bad headache she will take ibuprofen, it does help.  She reports chronic pain syndrome related to neck pain, back pain, sees [facility] orthopedics, Dr. Nelva Bush for pain management.  Since her last visit she reports a right rotator cuff tear.  She continues to complain of pins-and-needles, cramping, swelling in her right wrist, upper arm.  She reports that she is not working, is applying for disability.  She reports intermittent tremors in her right fingertips, worse when she is nervous.  She reports she will play with things in her fingers, like a rubber band, to help with the tremor.  She does not notice any difficulty with her handwriting or eating.  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  She reports she is swallowing pills, no longer requiring liquid medications.  She does report that she has gained 40 pounds back since having her feeding tube.  She does continue to complain of trouble with her balance, occasional falling, dizziness when standing.  She does complain of numbness, weakness in both legs, right is greater than left.  She feels this problem has gotten worse. This is been going on since her initial weight loss surgery.  She does report that when she stands she will stand slowly.  Apparently she checks her blood pressures and they have been normal.  Today she is requesting to restart nortriptyline in pill form.  She denies any changes to her medications or medical history.  REVIEW OF SYSTEMS: Out of a complete 14 system review of symptoms, the patient complains only of the following  symptoms, and all other reviewed systems are negative.  Headache, paresthesia, falls, gait instability   ALLERGIES: Allergies  Allergen Reactions  . Coconut Oil Anaphylaxis and Itching  . Morphine And Related Anaphylaxis and Hives    Pt states that she has tolerated Norco and Dilaudid  . Oxycodone Anaphylaxis  . Ciprofloxacin Itching and Rash  . Trazodone And Nefazodone Rash  . Vistaril [Hydroxyzine Hcl] Hives  . Doxycycline Nausea And Vomiting  . Adhesive [Tape] Rash    And paper tape causes a rash if wearing for a prolong period of time  . Penicillins Rash    Has patient had a PCN reaction causing immediate rash, facial/tongue/throat swelling, SOB or lightheadedness with hypotension: Yes Has patient had a PCN reaction causing severe rash involving mucus membranes or skin necrosis: No Has patient had a PCN reaction that required hospitalization No Has patient had a PCN reaction occurring within the last 10 years: No If all of the above answers are "NO", then may proceed with Cephalosporin use.    HOME MEDICATIONS: Outpatient Medications Prior to Visit  Medication Sig Dispense Refill  . Cholecalciferol (VITAMIN D3) 1.25 MG (50000 UT) CAPS TAKE 1 CAPSULE BY MOUTH ONE TIME PER WEEK    . cyanocobalamin (,VITAMIN B-12,) 1000 MCG/ML injection INJECT 1 ML TWICE A MONTH BY SUBCUTANEOUS ROUTE.    . DULoxetine (CYMBALTA) 60 MG capsule Take 1 capsule (60 mg total) by mouth 2 (two) times daily. 60 capsule 0  . gabapentin (NEURONTIN) 100 MG capsule Take 1 capsule (100 mg total) by mouth 3 (three) times daily. 90 capsule 0  . HYDROmorphone (DILAUDID) 4 MG tablet hydromorphone 4 mg tablet  TAKE 1 TABLET BY MOUTH EVERY 6 HOURS AS NEEDED    . metoCLOPramide (REGLAN) 10 MG tablet metoclopramide 10 mg tablet  TAKE 1 TABLET (10 MG TOTAL) BY MOUTH 3 (THREE) TIMES DAILY BEFORE MEALS.    Marland Kitchen pantoprazole (PROTONIX) 40 MG tablet pantoprazole 40 mg tablet,delayed release  TAKE 1 TABLET (40 MG TOTAL) BY  MOUTH 2 (TWO) TIMES DAILY BEFORE A MEAL.    Marland Kitchen thiamine (VITAMIN B-1) 100 MG tablet Take 100 mg by mouth daily at 12 noon.     . nortriptyline (PAMELOR) 10 MG capsule nortriptyline 10 mg capsule  START TAKING 1 CAP AT BEDTIME X 1 WK,THEN 2 CAPS AT BEDTIME X 1 WK, THEN CAN TAKE 3 CAPS AT BEDTIME     No facility-administered medications prior to visit.     PAST MEDICAL HISTORY: Past Medical History:  Diagnosis Date  . Anal fissure - posterior 10/16/2014    OCC  ISSUES  . Anxiety    doesn't take anything  . Asthma    has Albuterol inhaler as needed  . Boil    on pubic area started septra 03-01-17 draining blood and pus  . Bronchitis   . Chronic headache disorder 07/22/2016  . Clostridium difficile infection 04/20/2012  . Colitis   . Dehydration   . Depression   . Family  history of adverse reaction to anesthesia    pt mom gets sick  . GERD (gastroesophageal reflux disease)    takes Pantoprazole daily  . Headache   . Hiatal hernia    neuropathy - mild in arms and legs  . History of blood transfusion    last transfusion was 04/04/2016=Benadryl was given d/t itching. States she always itches with transfusion.   Marland Kitchen History of bronchitis    > 2 yrs ago  . History of colon polyps    benign  . History of migraine    last one 05/01/16  . History of stomach ulcers   . History of urinary tract infection LAST 2 WEEKS AGO  . IDA (iron deficiency anemia)   . Internal and external bleeding hemorrhoids 06/11/2014  . Joint pain   . Joint swelling   . Left sided chronic colitis - segmental 06/11/2014  . Marginal ulcer 10/25/2017  . Migraine   . Motion sickness   . Nausea    takes Zofran as needed  . Nausea and vomiting    for 1 year  . Obesity   . Oligouria   . Osteoarthritis    lower back, knees, wrists - no meds  . Peripheral neuropathy 03/01/2019  . Pneumonia 1997  . Postoperative nausea and vomiting 01/21/2016   wants scopolamine patch  . Right carpal tunnel syndrome 03/01/2019  . SVD  (spontaneous vaginal delivery)    x 4  . Tingling    BOTH LOWER EXTREMETIES ALL THE TIME DUE TO RAPID WEIGHT LOSS  . TOBACCO USER 10/02/2009   Qualifier: Diagnosis of  By: Dimas Millin MD, Ellard Artis    . Transfusion history    last transfusion 6'16   . Tremor 08/31/2018  . UC (ulcerative colitis) ([place])    supposed to be taking Lialda and Bentyl but has been off since gastric sleeve  . Vertigo    doesn't take any meds    PAST SURGICAL HISTORY: Past Surgical History:  Procedure Laterality Date  . ABDOMINAL HYSTERECTOMY     PARTIAL  . COLONOSCOPY  2007   for rectal bleeding; [facility]  . COLONOSCOPY N/A 06/11/2014   Procedure: COLONOSCOPY;  Surgeon: Gatha Mayer, MD;  Location: [facility];  Service: Endoscopy;  Laterality: N/A;  . COLONOSCOPY WITH PROPOFOL N/A 03/02/2017   Procedure: COLONOSCOPY WITH PROPOFOL;  Surgeon: Alphonsa Overall, MD;  Location: [facility];  Service: General;  Laterality: N/A;  . DILATION AND CURETTAGE OF UTERUS    . ESOPHAGOGASTRODUODENOSCOPY (EGD) WITH PROPOFOL N/A 04/06/2016   Procedure: ESOPHAGOGASTRODUODENOSCOPY (EGD) WITH PROPOFOL;  Surgeon: Alphonsa Overall, MD;  Location: [facility];  Service: General;  Laterality: N/A;  . ESOPHAGOGASTRODUODENOSCOPY (EGD) WITH PROPOFOL N/A 03/02/2017   Procedure: ESOPHAGOGASTRODUODENOSCOPY (EGD) WITH PROPOFOL;  Surgeon: Alphonsa Overall, MD;  Location: [facility];  Service: General;  Laterality: N/A;  . ESOPHAGOGASTRODUODENOSCOPY (EGD) WITH PROPOFOL N/A 05/20/2017   Procedure: ESOPHAGOGASTRODUODENOSCOPY (EGD) WITH PROPOFOL ERAS PATHWAY;  Surgeon: Alphonsa Overall, MD;  Location: [facility];  Service: General;  Laterality: N/A;  . ESOPHAGOGASTRODUODENOSCOPY (EGD) WITH PROPOFOL N/A 10/07/2017   Procedure: ESOPHAGOGASTRODUODENOSCOPY (EGD) WITH PROPOFOL;  Surgeon: Alphonsa Overall, MD;  Location: [facility];  Service: General;  Laterality: N/A;  . EXCISION OF SKIN TAG  06/08/2017   Procedure: EXCISION OF VULVAR SKIN TAGS X2;  Surgeon: [name], MD;  Location: [facility];  Service: Gynecology;;  . FOOT SURGERY Bilateral    x 2  . GASTRIC ROUX-EN-Y N/A 12/14/2016   Procedure: LAPAROSCOPIC REVISION  SLEEVE GASTRECTOMY TO  ROUX-Y-GASTRIC BY-PASS, UPPER ENDO;  Surgeon: Excell Seltzer, MD;  Location: [facility];  Service: General;  Laterality: N/A;  . GASTROJEJUNOSTOMY N/A 05/11/2016   Procedure: LAPAROSCOPIC PLACEMENT  OF FEEDING  JEJUNOSTOMY TUBE;  Surgeon: Excell Seltzer, MD;  Location: [facility];  Service: General;  Laterality: N/A;  . HEMORRHOIDECTOMY WITH HEMORRHOID BANDING    . IR FLUORO GUIDE CV LINE RIGHT  04/06/2017  . IR GENERIC HISTORICAL  05/19/2016   IR CM INJ ANY COLONIC TUBE W/FLUORO 05/19/2016 Aletta Edouard, MD [facility]  . IR PATIENT EVAL TECH 0-60 MINS  11/30/2017  . IR REPLC DUODEN/JEJUNO TUBE PERCUT W/FLUORO  03/14/2018  . IR REPLC DUODEN/JEJUNO TUBE PERCUT W/FLUORO  03/15/2018  . IR US GUIDE VASC ACCESS RIGHT  04/06/2017  . j tube removed march 2018    . KNEE ARTHROSCOPY Left 09/06/2014  . LAPAROSCOPIC GASTRIC SLEEVE RESECTION N/A 01/14/2016   Procedure: LAPAROSCOPIC GASTRIC SLEEVE RESECTION;  Surgeon: Excell Seltzer, MD;  Location: [facility];  Service: General;  Laterality: N/A;  . LAPAROSCOPIC REVISION OF GASTROJEJUNOSTOMY N/A 10/25/2017   Procedure: LAPAROSCOPIC REVISION OF GASTROJEJUNOSTOMY AND PARTIAL GASTRECTOMY, WITH PLACEMENT OF FEEDING GASTROSTOMY TUBE;  Surgeon: Excell Seltzer, MD;  Location: [facility];  Service: General;  Laterality: N/A;  . LAPAROSCOPIC TUBAL LIGATION  10/16/2011   Procedure: LAPAROSCOPIC TUBAL LIGATION;  Surgeon: Alwyn Pea, MD;  Location: [facility];  Service: Gynecology;  Laterality: Bilateral;  . NOVASURE ABLATION  09/28/2010   mild persistent vaginal bleeding  . right knee arthroscopy     05/07/2016  . TOTAL KNEE ARTHROPLASTY Left 03/10/2018   Procedure: LEFT TOTAL KNEE ARTHROPLASTY;  Surgeon: Paralee Cancel, MD;  Location: [facility];  Service: Orthopedics;  Laterality: Left;  70 mins  . TUBAL  LIGATION    . VAGINAL HYSTERECTOMY Bilateral 06/08/2017   Procedure: HYSTERECTOMY VAGINAL W/ BILATERAL SALPINGECTOMY;  Surgeon: [name], MD;  Location: [facility];  Service: Gynecology;  Laterality: Bilateral;  . wisdom teeth extracted      FAMILY HISTORY: Family History  Problem Relation Age of Onset  . Hypertension Mother   . Diabetes Mother   . Sarcoidosis Mother        lungs and skin  . Asthma Mother   . Hypertension Father   . Diabetes Father   . Asthma Son   . Tics Son   . Asthma Sister   . Cancer Sister        possible pancreatic cancer  . Adrenal disorder Sister        Tumor   . Asthma Brother   . Asthma Daughter        died age 24.5  . Cancer Daughter 4       brain; died age 24.5  . Asthma Son   . Cancer Paternal Aunt        brain, colon, lung and esophagus; unsure of primary   . Breast cancer Paternal Aunt     SOCIAL HISTORY: Social History   Socioeconomic History  . Marital status: Single    Spouse name: Not on file  . Number of children: 3  . Years of education: Some college  . Highest education level: Not on file  Occupational History  . Occupation: Production designer, theatre/television/film    Comment: Currently out on disability d/t surg  Social Needs  . Financial resource strain: Not hard at all  . Food insecurity    Worry: Never true    Inability: Never true  .  Transportation needs    Medical: No    Non-medical: No  Tobacco Use  . Smoking status: Former Smoker    Packs/day: 0.50    Years: 20.00    Pack years: 10.00    Types: Cigarettes    Quit date: 03/17/2014    Years since quitting: 5.1  . Smokeless tobacco: Never Used  Substance and Sexual Activity  . Alcohol use: Yes    Alcohol/week: 0.0 standard drinks  . Drug use: No  . Sexual activity: Not Currently    Birth control/protection: Surgical, Abstinence  Lifestyle  . Physical activity    Days per week: 0 days    Minutes per session: 0 min  . Stress: To some extent  Relationships  . Social  Herbalist on phone: Once a week    Gets together: Once a week    Attends religious service: Never    Active member of club or organization: No    Attends meetings of clubs or organizations: Not on file    Relationship status: Separated  . Intimate partner violence    Fear of current or ex partner: No    Emotionally abused: No    Physically abused: No    Forced sexual activity: No  Other Topics Concern  . Not on file  Social History Narrative   Marital status:  Single; not dating seriously.      Children:  3 sons (61, 11, 85) whose father is deceased, 1 daughter (deceased age 42); no grandchildren      Lives:  With 2 sons      Employment:  Long term disability in 2018 due to PICC line for iv fluids; Corporate treasurer at [employer]      Tobacco: quit June 2015      Alcohol:  None      Drugs:  None      Exercise:  Leg lifts for knee strengthening; walking   1 caffeine beverage daily      Sexual activity:  Active; total partners = up there.  No STDs in past.        Right-handed   PHYSICAL EXAM  Vitals:   05/04/19 0811  BP: 118/83  Pulse: 83  Temp: (!) 96.4 F (35.8 C)  Weight: 188 lb 3.2 oz (85.4 kg)  Height: 5' 6.75" (1.695 m)   Body mass index is 29.7 kg/m.  Generalized: Well developed, in no acute distress   Neurological examination  Mentation: Alert oriented to time, place, history taking. Follows all commands speech and language fluent Cranial nerve II-XII: Pupils were equal round reactive to light. Extraocular movements were full, visual field were full on confrontational test. Facial sensation and strength were normal. Head turning and shoulder shrug were normal and symmetric. Motor: The motor testing reveals 5 over 5 strength of all 4 extremities. Good symmetric motor tone is noted throughout.  Sensory: Sensory testing is intact to soft touch on all 4 extremities. No evidence of extinction is noted.  Coordination: Cerebellar testing reveals good  finger-nose-finger and heel-to-shin bilaterally.  Gait and station: Gait is normal. Tandem gait is unsteady. Romberg is negative. No drift is seen.  Reflexes: Deep tendon reflexes are symmetric and normal bilaterally.   DIAGNOSTIC DATA (LABS, IMAGING, TESTING) - I reviewed patient records, labs, notes, testing and imaging myself where available.  Lab Results  Component Value Date   WBC 8.2 04/08/2019   HGB 11.9 (L) 04/08/2019   HCT 35.0 (L) 04/08/2019  MCV 96.1 04/08/2019   PLT 281 04/08/2019      Component Value Date/Time   NA 139 04/08/2019 0801   NA 138 04/22/2018 1038   K 4.3 04/08/2019 0801   CL 107 04/08/2019 0801   CO2 18 (L) 04/08/2019 0016   GLUCOSE 78 04/08/2019 0801   BUN 3 (L) 04/08/2019 0801   BUN 12 04/22/2018 1038   CREATININE 0.60 04/08/2019 0801   CREATININE 0.88 06/05/2016 1003   CALCIUM 8.7 (L) 04/08/2019 0016   PROT 7.0 04/08/2019 0016   PROT 7.7 04/22/2018 1038   ALBUMIN 3.7 04/08/2019 0016   ALBUMIN 4.4 04/22/2018 1038   AST 17 04/08/2019 0016   ALT 17 04/08/2019 0016   ALKPHOS 66 04/08/2019 0016   BILITOT 0.1 (L) 04/08/2019 0016   BILITOT 0.4 04/22/2018 1038   GFRNONAA >60 04/08/2019 0016   GFRNONAA 84 06/05/2016 1003   GFRAA >60 04/08/2019 0016   GFRAA >89 06/05/2016 1003   Lab Results  Component Value Date   CHOL 168 10/30/2017   HDL 60 10/30/2017   LDLCALC 88 10/30/2017   TRIG 98 10/30/2017   CHOLHDL 2.8 10/30/2017   Lab Results  Component Value Date   HGBA1C 5.1 07/26/2017   Lab Results  Component Value Date   [id number] 588 01/30/2019   Lab Results  Component Value Date   TSH 1.140 01/26/2018    ASSESSMENT AND PLAN 43 y.o. year old female  has a past medical history of Anal fissure - posterior (10/16/2014), Anxiety, Asthma, Boil, Bronchitis, Chronic headache disorder (07/22/2016), Clostridium difficile infection (04/20/2012), Colitis, Dehydration, Depression, Family history of adverse reaction to anesthesia, GERD  (gastroesophageal reflux disease), Headache, Hiatal hernia, History of blood transfusion, History of bronchitis, History of colon polyps, History of migraine, History of stomach ulcers, History of urinary tract infection (LAST 2 WEEKS AGO), IDA (iron deficiency anemia), Internal and external bleeding hemorrhoids (06/11/2014), Joint pain, Joint swelling, Left sided chronic colitis - segmental (06/11/2014), Marginal ulcer (10/25/2017), Migraine, Motion sickness, Nausea, Nausea and vomiting, Obesity, Oligouria, Osteoarthritis, Peripheral neuropathy (03/01/2019), Pneumonia (1997), Postoperative nausea and vomiting (01/21/2016), Right carpal tunnel syndrome (03/01/2019), SVD (spontaneous vaginal delivery), Tingling, TOBACCO USER (10/02/2009), Transfusion history, Tremor (08/31/2018), UC (ulcerative colitis) ([place]), and Vertigo. here with:  1. Peripheral neuropathy 2. Gait instability 3. Headaches  4. Tremor   She has a multitude of chronic issues.  For her headaches, she reports daily headache.  She will continue taking nortriptyline 30 mg at bedtime.  In the past she has tried Topamax, and gabapentin.  She is not a candidate for beta-blocker due to history of asthma and underlying depression.  We will start Aimovig 140 mg monthly injection for headache prevention.  She has chronic neck and back pain that she sees pain management for.  She is prescribed Dilaudid as needed.  She describes gait instability, falls related to tripping because she doesn't lift her foot up high enough.  She has had similar complaints as far back as 2017. She had MRI of the brain and cervical spine in 2017 that were normal.  We discussed possibly trying physical therapy for gait training, but she deferred at this time.  EMG evaluation 03/01/2019 showed evidence of left leg distal chronic denervation consistent with a peripheral neuropathy. This could be the source of her gait instability.  She can continue taking baclofen in the evening for report  nocturnal leg cramps. There is no resting tremor noted on exam.  She reports this has gone away.  A  few weeks ago she had a psychiatric admission for overdose.  She will follow-up in 3-4 months or sooner if needed. At that time we can determine if her headaches have improved with Aimovig and focus on her gait instability if that continues to be an issues.      I spent 25 minutes with the patient. 50% of this time was spent discussing her plan of care.    Butler Denmark, AGNP-C, DNP 05/04/2019, 8:51 AM [facility] [address] [place], [place] [address] [phone]

## 2019-05-04 ENCOUNTER — Ambulatory Visit (INDEPENDENT_AMBULATORY_CARE_PROVIDER_SITE_OTHER): Payer: 59 | Admitting: Neurology

## 2019-05-04 ENCOUNTER — Other Ambulatory Visit: Payer: Self-pay

## 2019-05-04 ENCOUNTER — Encounter: Payer: Self-pay | Admitting: Neurology

## 2019-05-04 ENCOUNTER — Telehealth: Payer: Self-pay | Admitting: Neurology

## 2019-05-04 VITALS — BP 118/83 | HR 83 | Temp 96.4°F | Ht 66.75 in | Wt 188.2 lb

## 2019-05-04 DIAGNOSIS — R51 Headache: Secondary | ICD-10-CM | POA: Diagnosis not present

## 2019-05-04 DIAGNOSIS — G609 Hereditary and idiopathic neuropathy, unspecified: Secondary | ICD-10-CM

## 2019-05-04 DIAGNOSIS — R2681 Unsteadiness on feet: Secondary | ICD-10-CM | POA: Insufficient documentation

## 2019-05-04 DIAGNOSIS — R519 Headache, unspecified: Secondary | ICD-10-CM

## 2019-05-04 MED ORDER — AIMOVIG 140 MG/ML ~~LOC~~ SOAJ: 140.0000 mg | SUBCUTANEOUS | 5 refills | Status: DC

## 2019-05-04 MED ORDER — NORTRIPTYLINE HCL 10 MG PO CAPS: ORAL_CAPSULE | ORAL | 5 refills | Status: DC

## 2019-05-04 NOTE — Progress Notes (Signed)
I have read the note, and I agree with the clinical assessment and plan.  Kacper Cartlidge K Tonae Livolsi   

## 2019-05-04 NOTE — Telephone Encounter (Signed)
Pt was called and accepted 11-09 with a check in of  8:45 this is a Pharmacist, hospital

## 2019-05-04 NOTE — Telephone Encounter (Signed)
Noted  

## 2019-05-04 NOTE — Telephone Encounter (Signed)
Please call the patient and have her set-up to return in 3-4 months for revisit.

## 2019-05-04 NOTE — Patient Instructions (Signed)
1. We will start Aimovig monthly injection for your headaches 2. You can continue nortriptyline, but we will likely decrease your dose in the future

## 2019-06-04 ENCOUNTER — Other Ambulatory Visit: Payer: Self-pay | Admitting: Family Medicine

## 2019-06-04 DIAGNOSIS — B379 Candidiasis, unspecified: Secondary | ICD-10-CM

## 2019-06-24 ENCOUNTER — Other Ambulatory Visit: Payer: Self-pay | Admitting: Family Medicine

## 2019-07-31 ENCOUNTER — Other Ambulatory Visit: Payer: Self-pay

## 2019-07-31 MED ORDER — PANTOPRAZOLE SODIUM 40 MG PO TBEC: DELAYED_RELEASE_TABLET | ORAL | 1 refills | Status: DC

## 2019-07-31 NOTE — Telephone Encounter (Signed)
Pantoprazole  Refilled, told to continue at last visit.

## 2019-08-06 NOTE — Progress Notes (Signed)
PATIENT: [name] DOB: January 07, 1976  REASON FOR VISIT: follow up HISTORY FROM: patient  HISTORY OF PRESENT ILLNESS: Today 08/07/19  [name] is a 43 year old female with history of headache, mild resting tremor in the right arm, and paresthesias in the right arm.  She had a nerve conduction study in June 2020, showing improvement from prior study in 2016, but she still had a mild primarily motor peripheral neuropathy.  She had EMG of the left leg showing distal chronic denervation consistent with a peripheral neuropathy.  At last visit she was started on Aimovig for report of headache.  She indicates her headaches have improved with Aimovig, was having daily headache, is now having 3 to 4 months, or less severe.  Her walking has improved some, but she still may trip, feel that her left foot gets hung.  Since last seen, she has had right shoulder surgery.  She denies history of diabetes.  She remains on B12 injections.  She continues to struggle with depression, she is on Wellbutrin, Cymbalta, and BuSpar.  We were prescribing nortriptyline for her headaches, her dose has been decreased as her foot doctor started her on Terbinafine.  She continues to see pain management for chronic back pain.  She denies any new numbness or weakness to her arms or legs.  She indicates her legs continue to fall asleep.  She denies any changes to her bowels or bladder.  She presents today for follow-up unaccompanied.  HISTORY  05/04/2019 SS: [name] is a 43 year old female with history of headache, mild resting tremor in the right arm, and paresthesias in the right arm.  She had an EMG nerve conduction evaluation in June 2020.  Nerve conductions show improvement from the prior study in 2016 but the patient still has a mild primarily motor peripheral neuropathy.  She had EMG of the left leg showing distal chronic denervation consistent with a peripheral neuropathy.  She is started on baclofen for frequent nocturnal  leg cramps.  In July 2020 for she had a psychiatric admission for overdose, after she swallowed a bag of marijuana. She says the neuropathy is about the same. She will wake up in the middle of the night with leg cramps. In June 8th she had right rotator cuff repair, carpal tunnel release. She says she will get migraines everyday. She says it may last a few hours. She doesn't take anything for the headaches. She may feel the pain right temporally, and retro-orbitally. She confirms photophobia. She is nauseated all the time, but she has stomach issues. She denies any new numbness or weakness in arms or legs. She says she may lose her balance at random times. Sometimes she may not lift her foot up high enough and she may trip. She says she has fallen 3 times this week. She does see pain management for neck and back pain.  She denies any new numbness or weakness in her arms or legs.  She denies any changes to the bowel or bladder. She says she is no longer having tremor. She presents for follow-up unaccompanied.  REVIEW OF SYSTEMS: Out of a complete 14 system review of symptoms, the patient complains only of the following symptoms, and all other reviewed systems are negative.  Memory loss, dizziness, headache, numbness, weakness, tremors  ALLERGIES: Allergies  Allergen Reactions  . Coconut Oil Anaphylaxis and Itching  . Morphine And Related Anaphylaxis and Hives    Pt states that she has tolerated Norco and Dilaudid  .  Oxycodone Anaphylaxis  . Ciprofloxacin Itching and Rash  . Trazodone And Nefazodone Rash  . Vistaril [Hydroxyzine Hcl] Hives  . Doxycycline Nausea And Vomiting  . Adhesive [Tape] Rash    And paper tape causes a rash if wearing for a prolong period of time  . Penicillins Rash    Has patient had a PCN reaction causing immediate rash, facial/tongue/throat swelling, SOB or lightheadedness with hypotension: Yes Has patient had a PCN reaction causing severe rash involving mucus membranes or  skin necrosis: No Has patient had a PCN reaction that required hospitalization No Has patient had a PCN reaction occurring within the last 10 years: No If all of the above answers are "NO", then may proceed with Cephalosporin use.    HOME MEDICATIONS: Outpatient Medications Prior to Visit  Medication Sig Dispense Refill  . baclofen (LIORESAL) 10 MG tablet Take 5 mg by mouth at bedtime.    . Buprenorphine HCl (BELBUCA BU) Place inside cheek 4 (four) times daily.    Marland Kitchen buPROPion (WELLBUTRIN XL) 150 MG 24 hr tablet Take 150 mg by mouth daily.    . busPIRone (BUSPAR) 10 MG tablet Take 10 mg by mouth 3 (three) times daily.    . Cholecalciferol (VITAMIN D3) 1.25 MG (50000 UT) CAPS TAKE 1 CAPSULE BY MOUTH ONE TIME PER WEEK    . cyanocobalamin (,VITAMIN B-12,) 1000 MCG/ML injection INJECT 1 ML TWICE A MONTH BY SUBCUTANEOUS ROUTE.    Marland Kitchen diclofenac sodium (VOLTAREN) 1 % GEL Apply 2 g topically 3 (three) times daily.    . DULoxetine (CYMBALTA) 60 MG capsule Take 1 capsule (60 mg total) by mouth 2 (two) times daily. 60 capsule 0  . fluconazole (DIFLUCAN) 150 MG tablet TAKE 1 TABLET BY MOUTH ONCE. MAY REPEAT IN 24 HOURS IF NEEDED 2 tablet 0  . gabapentin (NEURONTIN) 100 MG capsule Take 1 capsule (100 mg total) by mouth 3 (three) times daily. (Patient taking differently: Take 400 mg by mouth 2 (two) times daily. 2 tablets at bedtime) 90 capsule 0  . linaclotide (LINZESS) 290 MCG CAPS capsule Take 290 mcg by mouth daily before breakfast.    . metoCLOPramide (REGLAN) 10 MG tablet metoclopramide 10 mg tablet  TAKE 1 TABLET (10 MG TOTAL) BY MOUTH 3 (THREE) TIMES DAILY BEFORE MEALS.    Marland Kitchen nortriptyline (PAMELOR) 10 MG capsule Take 3 capsules at bedtime (Patient taking differently: Take 2 capsules at bedtime) 90 capsule 5  . pantoprazole (PROTONIX) 40 MG tablet 180pantoprazole 40 mg tablet,delayed release  TAKE 1 TABLET (40 MG TOTAL) BY MOUTH 2 (TWO) TIMES DAILY BEFORE A MEAL. 180 tablet 1  . terbinafine (LAMISIL)  250 MG tablet Take 250 mg by mouth daily.    Marland Kitchen thiamine (VITAMIN B-1) 100 MG tablet Take 100 mg by mouth daily at 12 noon.     Eduard Roux (AIMOVIG) 140 MG/ML SOAJ Inject 140 mg into the skin every 30 (thirty) days. 1 pen 5  . HYDROmorphone (DILAUDID) 4 MG tablet hydromorphone 4 mg tablet  TAKE 1 TABLET BY MOUTH EVERY 6 HOURS AS NEEDED     No facility-administered medications prior to visit.     PAST MEDICAL HISTORY: Past Medical History:  Diagnosis Date  . Anal fissure - posterior 10/16/2014    OCC  ISSUES  . Anxiety    doesn't take anything  . Asthma    has Albuterol inhaler as needed  . Boil    on pubic area started septra 03-01-17 draining blood and pus  .  Bronchitis   . Chronic headache disorder 07/22/2016  . Clostridium difficile infection 04/20/2012  . Colitis   . Dehydration   . Depression   . Family history of adverse reaction to anesthesia    pt mom gets sick  . GERD (gastroesophageal reflux disease)    takes Pantoprazole daily  . Headache   . Hiatal hernia    neuropathy - mild in arms and legs  . History of blood transfusion    last transfusion was 04/04/2016=Benadryl was given d/t itching. States she always itches with transfusion.   Marland Kitchen History of bronchitis    > 2 yrs ago  . History of colon polyps    benign  . History of migraine    last one 05/01/16  . History of stomach ulcers   . History of urinary tract infection LAST 2 WEEKS AGO  . IDA (iron deficiency anemia)   . Internal and external bleeding hemorrhoids 06/11/2014  . Joint pain   . Joint swelling   . Left sided chronic colitis - segmental 06/11/2014  . Marginal ulcer 10/25/2017  . Migraine   . Motion sickness   . Nausea    takes Zofran as needed  . Nausea and vomiting    for 1 year  . Obesity   . Oligouria   . Osteoarthritis    lower back, knees, wrists - no meds  . Peripheral neuropathy 03/01/2019  . Pneumonia 1997  . Postoperative nausea and vomiting 01/21/2016   wants scopolamine patch  .  Right carpal tunnel syndrome 03/01/2019  . SVD (spontaneous vaginal delivery)    x 4  . Tingling    BOTH LOWER EXTREMETIES ALL THE TIME DUE TO RAPID WEIGHT LOSS  . TOBACCO USER 10/02/2009   Qualifier: Diagnosis of  By: Dimas Millin MD, Ellard Artis    . Transfusion history    last transfusion 6'16   . Tremor 08/31/2018  . UC (ulcerative colitis) ([place])    supposed to be taking Lialda and Bentyl but has been off since gastric sleeve  . Vertigo    doesn't take any meds    PAST SURGICAL HISTORY: Past Surgical History:  Procedure Laterality Date  . ABDOMINAL HYSTERECTOMY     PARTIAL  . COLONOSCOPY  2007   for rectal bleeding; [facility]  . COLONOSCOPY N/A 06/11/2014   Procedure: COLONOSCOPY;  Surgeon: Gatha Mayer, MD;  Location: [facility];  Service: Endoscopy;  Laterality: N/A;  . COLONOSCOPY WITH PROPOFOL N/A 03/02/2017   Procedure: COLONOSCOPY WITH PROPOFOL;  Surgeon: Alphonsa Overall, MD;  Location: [facility];  Service: General;  Laterality: N/A;  . DILATION AND CURETTAGE OF UTERUS    . ESOPHAGOGASTRODUODENOSCOPY (EGD) WITH PROPOFOL N/A 04/06/2016   Procedure: ESOPHAGOGASTRODUODENOSCOPY (EGD) WITH PROPOFOL;  Surgeon: Alphonsa Overall, MD;  Location: [facility];  Service: General;  Laterality: N/A;  . ESOPHAGOGASTRODUODENOSCOPY (EGD) WITH PROPOFOL N/A 03/02/2017   Procedure: ESOPHAGOGASTRODUODENOSCOPY (EGD) WITH PROPOFOL;  Surgeon: Alphonsa Overall, MD;  Location: [facility];  Service: General;  Laterality: N/A;  . ESOPHAGOGASTRODUODENOSCOPY (EGD) WITH PROPOFOL N/A 05/20/2017   Procedure: ESOPHAGOGASTRODUODENOSCOPY (EGD) WITH PROPOFOL ERAS PATHWAY;  Surgeon: Alphonsa Overall, MD;  Location: [facility];  Service: General;  Laterality: N/A;  . ESOPHAGOGASTRODUODENOSCOPY (EGD) WITH PROPOFOL N/A 10/07/2017   Procedure: ESOPHAGOGASTRODUODENOSCOPY (EGD) WITH PROPOFOL;  Surgeon: Alphonsa Overall, MD;  Location: [facility];  Service: General;  Laterality: N/A;  . EXCISION OF SKIN TAG  06/08/2017   Procedure: EXCISION  OF VULVAR SKIN TAGS X2;  Surgeon: [name],  MD;  Location: [facility];  Service: Gynecology;;  . FOOT SURGERY Bilateral    x 2  . GASTRIC ROUX-EN-Y N/A 12/14/2016   Procedure: LAPAROSCOPIC REVISION SLEEVE GASTRECTOMY TO  ROUX-Y-GASTRIC BY-PASS, UPPER ENDO;  Surgeon: Excell Seltzer, MD;  Location: [facility];  Service: General;  Laterality: N/A;  . GASTROJEJUNOSTOMY N/A 05/11/2016   Procedure: LAPAROSCOPIC PLACEMENT  OF FEEDING  JEJUNOSTOMY TUBE;  Surgeon: Excell Seltzer, MD;  Location: [facility];  Service: General;  Laterality: N/A;  . HEMORRHOIDECTOMY WITH HEMORRHOID BANDING    . IR FLUORO GUIDE CV LINE RIGHT  04/06/2017  . IR GENERIC HISTORICAL  05/19/2016   IR CM INJ ANY COLONIC TUBE W/FLUORO 05/19/2016 Aletta Edouard, MD [facility]  . IR PATIENT EVAL TECH 0-60 MINS  11/30/2017  . IR REPLC DUODEN/JEJUNO TUBE PERCUT W/FLUORO  03/14/2018  . IR REPLC DUODEN/JEJUNO TUBE PERCUT W/FLUORO  03/15/2018  . IR US GUIDE VASC ACCESS RIGHT  04/06/2017  . j tube removed march 2018    . KNEE ARTHROSCOPY Left 09/06/2014  . LAPAROSCOPIC GASTRIC SLEEVE RESECTION N/A 01/14/2016   Procedure: LAPAROSCOPIC GASTRIC SLEEVE RESECTION;  Surgeon: Excell Seltzer, MD;  Location: [facility];  Service: General;  Laterality: N/A;  . LAPAROSCOPIC REVISION OF GASTROJEJUNOSTOMY N/A 10/25/2017   Procedure: LAPAROSCOPIC REVISION OF GASTROJEJUNOSTOMY AND PARTIAL GASTRECTOMY, WITH PLACEMENT OF FEEDING GASTROSTOMY TUBE;  Surgeon: Excell Seltzer, MD;  Location: [facility];  Service: General;  Laterality: N/A;  . LAPAROSCOPIC TUBAL LIGATION  10/16/2011   Procedure: LAPAROSCOPIC TUBAL LIGATION;  Surgeon: Alwyn Pea, MD;  Location: [facility];  Service: Gynecology;  Laterality: Bilateral;  . NOVASURE ABLATION  09/28/2010   mild persistent vaginal bleeding  . right knee arthroscopy     05/07/2016  . TOTAL KNEE ARTHROPLASTY Left 03/10/2018   Procedure: LEFT TOTAL KNEE ARTHROPLASTY;  Surgeon: Paralee Cancel, MD;  Location: [facility];  Service:  Orthopedics;  Laterality: Left;  70 mins  . TUBAL LIGATION    . VAGINAL HYSTERECTOMY Bilateral 06/08/2017   Procedure: HYSTERECTOMY VAGINAL W/ BILATERAL SALPINGECTOMY;  Surgeon: [name], MD;  Location: [facility];  Service: Gynecology;  Laterality: Bilateral;  . wisdom teeth extracted      FAMILY HISTORY: Family History  Problem Relation Age of Onset  . Hypertension Mother   . Diabetes Mother   . Sarcoidosis Mother        lungs and skin  . Asthma Mother   . Hypertension Father   . Diabetes Father   . Asthma Son   . Tics Son   . Asthma Sister   . Cancer Sister        possible pancreatic cancer  . Adrenal disorder Sister        Tumor   . Asthma Brother   . Asthma Daughter        died age 10.5  . Cancer Daughter 4       brain; died age 10.5  . Asthma Son   . Cancer Paternal Aunt        brain, colon, lung and esophagus; unsure of primary   . Breast cancer Paternal Aunt     SOCIAL HISTORY: Social History   Socioeconomic History  . Marital status: Single    Spouse name: Not on file  . Number of children: 3  . Years of education: Some college  . Highest education level: Not on file  Occupational History  . Occupation: Production designer, theatre/television/film    Comment: Currently out on disability d/t surg  Social Needs  . Financial resource strain: Not hard at all  . Food insecurity    Worry: Never true    Inability: Never true  . Transportation needs    Medical: No    Non-medical: No  Tobacco Use  . Smoking status: Former Smoker    Packs/day: 0.50    Years: 20.00    Pack years: 10.00    Types: Cigarettes    Quit date: 03/17/2014    Years since quitting: 5.3  . Smokeless tobacco: Never Used  Substance and Sexual Activity  . Alcohol use: Yes    Alcohol/week: 0.0 standard drinks  . Drug use: No  . Sexual activity: Not Currently    Birth control/protection: Surgical, Abstinence  Lifestyle  . Physical activity    Days per week: 0 days    Minutes per session: 0 min  . Stress:  To some extent  Relationships  . Social Herbalist on phone: Once a week    Gets together: Once a week    Attends religious service: Never    Active member of club or organization: No    Attends meetings of clubs or organizations: Not on file    Relationship status: Separated  . Intimate partner violence    Fear of current or ex partner: No    Emotionally abused: No    Physically abused: No    Forced sexual activity: No  Other Topics Concern  . Not on file  Social History Narrative   Marital status:  Single; not dating seriously.      Children:  3 sons (52, 41, 59) whose father is deceased, 1 daughter (deceased age 69); no grandchildren      Lives:  With 2 sons      Employment:  Long term disability in 2018 due to PICC line for iv fluids; Corporate treasurer at [employer]      Tobacco: quit June 2015      Alcohol:  None      Drugs:  None      Exercise:  Leg lifts for knee strengthening; walking   1 caffeine beverage daily      Sexual activity:  Active; total partners = up there.  No STDs in past.        Right-handed    PHYSICAL EXAM  Vitals:   08/07/19 0903  BP: 120/80  Pulse: 87  Temp: 98 F (36.7 C)  TempSrc: Oral  Weight: 173 lb 12.8 oz (78.8 kg)  Height: 5' 6.75" (1.695 m)   Body mass index is 27.43 kg/m.  Generalized: Well developed, in no acute distress   Neurological examination  Mentation: Alert oriented to time, place, history taking. Follows all commands speech and language fluent Cranial nerve II-XII: Pupils were equal round reactive to light. Extraocular movements were full, visual field were full on confrontational test. Facial sensation and strength were normal. Head turning and shoulder shrug  were normal and symmetric. Motor: The motor testing reveals 5 over 5 strength of all 4 extremities. Good symmetric motor tone is noted throughout.  Sensory: Sensory testing is intact to soft touch on all 4 extremities, pinprick and vibration sensation  is intact to lower extremities. No evidence of extinction is noted.  Coordination: Cerebellar testing reveals good finger-nose-finger and heel-to-shin bilaterally.  Gait and station: Gait is normal. Tandem gait is normal. Romberg is negative.  Able to walk on heels and tiptoes, transition between the movement causes her to lose balance  DIAGNOSTIC DATA (LABS, IMAGING, TESTING) - I reviewed patient records, labs, notes, testing and imaging myself where available.  Lab Results  Component Value Date   WBC 8.2 04/08/2019   HGB 11.9 (L) 04/08/2019   HCT 35.0 (L) 04/08/2019   MCV 96.1 04/08/2019   PLT 281 04/08/2019      Component Value Date/Time   NA 139 04/08/2019 0801   NA 138 04/22/2018 1038   K 4.3 04/08/2019 0801   CL 107 04/08/2019 0801   CO2 18 (L) 04/08/2019 0016   GLUCOSE 78 04/08/2019 0801   BUN 3 (L) 04/08/2019 0801   BUN 12 04/22/2018 1038   CREATININE 0.60 04/08/2019 0801   CREATININE 0.88 06/05/2016 1003   CALCIUM 8.7 (L) 04/08/2019 0016   PROT 7.0 04/08/2019 0016   PROT 7.7 04/22/2018 1038   ALBUMIN 3.7 04/08/2019 0016   ALBUMIN 4.4 04/22/2018 1038   AST 17 04/08/2019 0016   ALT 17 04/08/2019 0016   ALKPHOS 66 04/08/2019 0016   BILITOT 0.1 (L) 04/08/2019 0016   BILITOT 0.4 04/22/2018 1038   GFRNONAA >60 04/08/2019 0016   GFRNONAA 84 06/05/2016 1003   GFRAA >60 04/08/2019 0016   GFRAA >89 06/05/2016 1003   Lab Results  Component Value Date   CHOL 168 10/30/2017   HDL 60 10/30/2017   LDLCALC 88 10/30/2017   TRIG 98 10/30/2017   CHOLHDL 2.8 10/30/2017   Lab Results  Component Value Date   HGBA1C 5.1 07/26/2017   Lab Results  Component Value Date   HCWCBJSE83 151 01/30/2019   Lab Results  Component Value Date   TSH 1.140 01/26/2018    ASSESSMENT AND PLAN 43 y.o. year old female  has a past medical history of Anal fissure - posterior (10/16/2014), Anxiety, Asthma, Boil, Bronchitis, Chronic headache disorder (07/22/2016), Clostridium difficile  infection (04/20/2012), Colitis, Dehydration, Depression, Family history of adverse reaction to anesthesia, GERD (gastroesophageal reflux disease), Headache, Hiatal hernia, History of blood transfusion, History of bronchitis, History of colon polyps, History of migraine, History of stomach ulcers, History of urinary tract infection (LAST 2 WEEKS AGO), IDA (iron deficiency anemia), Internal and external bleeding hemorrhoids (06/11/2014), Joint pain, Joint swelling, Left sided chronic colitis - segmental (06/11/2014), Marginal ulcer (10/25/2017), Migraine, Motion sickness, Nausea, Nausea and vomiting, Obesity, Oligouria, Osteoarthritis, Peripheral neuropathy (03/01/2019), Pneumonia (1997), Postoperative nausea and vomiting (01/21/2016), Right carpal tunnel syndrome (03/01/2019), SVD (spontaneous vaginal delivery), Tingling, TOBACCO USER (10/02/2009), Transfusion history, Tremor (08/31/2018), UC (ulcerative colitis) ([place]), and Vertigo. here with:  1.  Chronic migraine headache 2.  Gait instability  Her headaches have improved with the addition of Aimovig.  She was having daily headache, is now having 3-4 migraines a week, are less severe.  She will continue taking Aimovig 140 mg monthly injection.  She will decrease her dose of nortriptyline to 10 mg at bedtime for a week, then stop the medication.  She has already been on the dose reduction while she is being treated with Terbinafine.  Her depression seems to be under better control today, she remains on Wellbutrin, BuSpar, and Cymbalta.  In regards to her gait instability, EMG evaluation in June 2020 showed evidence of a left leg distal chronic denervation consistent with a peripheral neuropathy, there was no clear evidence of an overlying lumbosacral radiculopathy.  She continues to feel that it is difficult for her to lift her left foot, at times it will get hung, and she will lose her balance, causing her to fall.  MRI of the  brain and cervical spine in 2017 were  completely normal, she has been complaining of these problems since 2017.  She does see pain management for chronic back pain.  At this time, I will order physical therapy for gait training evaluation.  She will follow-up in 6 months or sooner if needed.  I did advise her symptoms worsen or she develops any new symptoms she should let us know.  I spent 25 minutes with the patient. 50% of this time was spent discussing her plan of care.  Butler Denmark, AGNP-C, DNP 08/07/2019, 9:41 AM [facility] [address] [place], [place] [address] [phone]

## 2019-08-07 ENCOUNTER — Ambulatory Visit (INDEPENDENT_AMBULATORY_CARE_PROVIDER_SITE_OTHER): Payer: 59 | Admitting: Neurology

## 2019-08-07 ENCOUNTER — Encounter: Payer: Self-pay | Admitting: Neurology

## 2019-08-07 ENCOUNTER — Other Ambulatory Visit: Payer: Self-pay

## 2019-08-07 VITALS — BP 120/80 | HR 87 | Temp 98.0°F | Ht 66.75 in | Wt 173.8 lb

## 2019-08-07 DIAGNOSIS — R519 Headache, unspecified: Secondary | ICD-10-CM

## 2019-08-07 DIAGNOSIS — G8929 Other chronic pain: Secondary | ICD-10-CM

## 2019-08-07 DIAGNOSIS — R2681 Unsteadiness on feet: Secondary | ICD-10-CM

## 2019-08-07 MED ORDER — AIMOVIG 140 MG/ML ~~LOC~~ SOAJ: 140.0000 mg | SUBCUTANEOUS | 6 refills | Status: DC

## 2019-08-07 NOTE — Patient Instructions (Addendum)
Continue Aimovig 140 mg, let's try to stop the nortriptyline take 10 mg for 1 week then stop the medication, we will try to get you set up for physical therapy.

## 2019-08-07 NOTE — Progress Notes (Signed)
I have read the note, and I agree with the clinical assessment and plan.  Charles K Willis   

## 2019-08-12 ENCOUNTER — Other Ambulatory Visit: Payer: Self-pay | Admitting: Neurology

## 2019-08-17 ENCOUNTER — Other Ambulatory Visit: Payer: Self-pay | Admitting: Neurology

## 2019-08-17 ENCOUNTER — Other Ambulatory Visit: Payer: Self-pay | Admitting: Family Medicine

## 2019-08-17 NOTE — Telephone Encounter (Signed)
Requested medication (s) are due for refill today: no  Requested medication (s) are on the active medication list: no  Last refill:  06/04/2019  Future visit scheduled: no  Notes to clinic:  Medication was discontinued    Requested Prescriptions  Pending Prescriptions Disp Refills   ondansetron (ZOFRAN-ODT) 8 MG disintegrating tablet [Pharmacy Med Name: ONDANSETRON ODT 8 MG TABLET] 20 tablet 5    Sig: TAKE 1 TABLET BY MOUTH EVERY 8 HOURS AS NEEDED FOR NAUSEA     Not Delegated - Gastroenterology: Antiemetics Failed - 08/17/2019 10:44 AM      Failed - This refill cannot be delegated      Failed - Valid encounter within last 6 months    Recent Outpatient Visits          6 months ago Viral respiratory infection   [facility], Ines Bloomer, MD   7 months ago Oral candidiasis   [facility], Lilia Argue, MD   1 year ago Iron deficiency anemia secondary to inadequate dietary iron intake   [facility], Lilia Argue, MD   1 year ago B12 deficiency   [facility], Ranell Patrick, MD   1 year ago Iron deficiency anemia secondary to inadequate dietary iron intake   [facility], Lilia Argue, MD

## 2019-08-21 ENCOUNTER — Encounter (HOSPITAL_COMMUNITY): Payer: Self-pay

## 2019-08-21 ENCOUNTER — Emergency Department (HOSPITAL_COMMUNITY): Payer: 59

## 2019-08-21 ENCOUNTER — Emergency Department (HOSPITAL_COMMUNITY)
Admission: EM | Admit: 2019-08-21 | Discharge: 2019-08-21 | Disposition: A | Discharge status: Home / Self Care | Payer: 59 | Attending: Emergency Medicine | Admitting: Emergency Medicine

## 2019-08-21 ENCOUNTER — Other Ambulatory Visit: Payer: Self-pay

## 2019-08-21 DIAGNOSIS — J45909 Unspecified asthma, uncomplicated: Secondary | ICD-10-CM | POA: Diagnosis not present

## 2019-08-21 DIAGNOSIS — Z79899 Other long term (current) drug therapy: Secondary | ICD-10-CM | POA: Insufficient documentation

## 2019-08-21 DIAGNOSIS — U071 COVID-19: Secondary | ICD-10-CM | POA: Diagnosis not present

## 2019-08-21 DIAGNOSIS — Z87891 Personal history of nicotine dependence: Secondary | ICD-10-CM | POA: Insufficient documentation

## 2019-08-21 DIAGNOSIS — R0602 Shortness of breath: Secondary | ICD-10-CM | POA: Diagnosis present

## 2019-08-21 DIAGNOSIS — J189 Pneumonia, unspecified organism: Secondary | ICD-10-CM

## 2019-08-21 DIAGNOSIS — Z20822 Contact with and (suspected) exposure to covid-19: Secondary | ICD-10-CM

## 2019-08-21 DIAGNOSIS — Z96652 Presence of left artificial knee joint: Secondary | ICD-10-CM | POA: Insufficient documentation

## 2019-08-21 LAB — I-STAT CHEM 8, ED
BUN: 12 mg/dL (ref 6–20)
Calcium, Ion: 1.19 mmol/L (ref 1.15–1.40)
Chloride: 108 mmol/L (ref 98–111)
Creatinine, Ser: 0.7 mg/dL (ref 0.44–1.00)
Glucose, Bld: 68 mg/dL — ABNORMAL LOW (ref 70–99)
HCT: 38 % (ref 36.0–46.0)
Hemoglobin: 12.9 g/dL (ref 12.0–15.0)
Potassium: 3.8 mmol/L (ref 3.5–5.1)
Sodium: 142 mmol/L (ref 135–145)
TCO2: 24 mmol/L (ref 22–32)

## 2019-08-21 LAB — CBC WITH DIFFERENTIAL/PLATELET
Abs Immature Granulocytes: 0.01 10*3/uL (ref 0.00–0.07)
Basophils Absolute: 0 10*3/uL (ref 0.0–0.1)
Basophils Relative: 1 %
Eosinophils Absolute: 0.3 10*3/uL (ref 0.0–0.5)
Eosinophils Relative: 5 %
HCT: 39.9 % (ref 36.0–46.0)
Hemoglobin: 12.7 g/dL (ref 12.0–15.0)
Immature Granulocytes: 0 %
Lymphocytes Relative: 47 %
Lymphs Abs: 3.1 10*3/uL (ref 0.7–4.0)
MCH: 30.4 pg (ref 26.0–34.0)
MCHC: 31.8 g/dL (ref 30.0–36.0)
MCV: 95.5 fL (ref 80.0–100.0)
Monocytes Absolute: 0.4 10*3/uL (ref 0.1–1.0)
Monocytes Relative: 6 %
Neutro Abs: 2.7 10*3/uL (ref 1.7–7.7)
Neutrophils Relative %: 41 %
Platelets: 255 10*3/uL (ref 150–400)
RBC: 4.18 MIL/uL (ref 3.87–5.11)
RDW: 12.9 % (ref 11.5–15.5)
WBC: 6.5 10*3/uL (ref 4.0–10.5)
nRBC: 0 % (ref 0.0–0.2)

## 2019-08-21 LAB — GROUP A STREP BY PCR: Group A Strep by PCR: NOT DETECTED

## 2019-08-21 MED ORDER — AZITHROMYCIN 250 MG PO TABS: 250.0000 mg | ORAL_TABLET | Freq: Every day | ORAL | 0 refills | Status: DC

## 2019-08-21 NOTE — Discharge Instructions (Signed)
You are seen in the ER for shortness of breath and chest discomfort.  X-ray showing possible pneumonia.  It is possible that the pneumonia is bacterial for which reason we have given you the antibiotics.  The pneumonia also could be because of COVID-19.  We are sending you home with a prescription which you can start taking immediately.  If your COVID-19 test results are positive then you can discontinue the antibiotics.  Please read instructions on the COVID-19 isolation.

## 2019-08-21 NOTE — ED Provider Notes (Signed)
[name] DEPT Provider Note   CSN: [id number] Arrival date & time: 08/21/19  1527     History   Chief Complaint Chief Complaint  Patient presents with  . Shortness of Breath  . Nasal Congestion  . Cough    HPI [name] is a 43 y.o. female.     HPI 43 year old female comes in a chief complaint of shortness of breath, sore throat, nasal congestion. She reports that she has been feeling sick for the last 2 or 3 days.  She had picked up a couple of friends from the hospital who were confirmed Covid positive. Additionally she is having cramping abdominal pain, nausea and generalized weakness.  She denies any shortness of breath.  She has no underlying heart or lung disease.  Past Medical History:  Diagnosis Date  . Anal fissure - posterior 10/16/2014    OCC  ISSUES  . Anxiety    doesn't take anything  . Asthma    has Albuterol inhaler as needed  . Boil    on pubic area started septra 03-01-17 draining blood and pus  . Bronchitis   . Chronic headache disorder 07/22/2016  . Clostridium difficile infection 04/20/2012  . Colitis   . Dehydration   . Depression   . Family history of adverse reaction to anesthesia    pt mom gets sick  . GERD (gastroesophageal reflux disease)    takes Pantoprazole daily  . Headache   . Hiatal hernia    neuropathy - mild in arms and legs  . History of blood transfusion    last transfusion was 04/04/2016=Benadryl was given d/t itching. States she always itches with transfusion.   Marland Kitchen History of bronchitis    > 2 yrs ago  . History of colon polyps    benign  . History of migraine    last one 05/01/16  . History of stomach ulcers   . History of urinary tract infection LAST 2 WEEKS AGO  . IDA (iron deficiency anemia)   . Internal and external bleeding hemorrhoids 06/11/2014  . Joint pain   . Joint swelling   . Left sided chronic colitis - segmental 06/11/2014  . Marginal ulcer 10/25/2017  . Migraine   .  Motion sickness   . Nausea    takes Zofran as needed  . Nausea and vomiting    for 1 year  . Obesity   . Oligouria   . Osteoarthritis    lower back, knees, wrists - no meds  . Peripheral neuropathy 03/01/2019  . Pneumonia 1997  . Postoperative nausea and vomiting 01/21/2016   wants scopolamine patch  . Right carpal tunnel syndrome 03/01/2019  . SVD (spontaneous vaginal delivery)    x 4  . Tingling    BOTH LOWER EXTREMETIES ALL THE TIME DUE TO RAPID WEIGHT LOSS  . TOBACCO USER 10/02/2009   Qualifier: Diagnosis of  By: Dimas Millin MD, Ellard Artis    . Transfusion history    last transfusion 6'16   . Tremor 08/31/2018  . UC (ulcerative colitis) ([place])    supposed to be taking Lialda and Bentyl but has been off since gastric sleeve  . Vertigo    doesn't take any meds    Patient Active Problem List   Diagnosis Date Noted  . Headache 05/04/2019  . Gait instability 05/04/2019  . Suicidal ideations   . MDD (major depressive disorder), recurrent severe, without psychosis ([place]) 04/08/2019  . Peripheral neuropathy 03/01/2019  . Right carpal  tunnel syndrome 03/01/2019  . Chronic constipation 01/26/2019  . 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 B12 deficiency 12/01/2017  . Malnutrition, calorie ([place]) 03/27/2017  . Psychophysiological insomnia 03/18/2017  . Chronic headache disorder 07/22/2016  . Memory difficulty 07/22/2016  . 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 Anemia, iron deficiency 10/01/2015  . Left sided chronic colitis - segmental 06/11/2014  . Asthma 04/26/2012  . Reactive depression 10/02/2009    Past Surgical History:  Procedure Laterality Date  . ABDOMINAL HYSTERECTOMY     PARTIAL  . COLONOSCOPY  2007   for rectal bleeding; [facility]  . COLONOSCOPY N/A 06/11/2014   Procedure: COLONOSCOPY;  Surgeon: Gatha Mayer, MD;  Location: [facility];  Service: Endoscopy;  Laterality: N/A;  . COLONOSCOPY WITH PROPOFOL N/A 03/02/2017   Procedure: COLONOSCOPY WITH PROPOFOL;  Surgeon: Alphonsa Overall, MD;  Location: [facility];  Service: General;  Laterality: N/A;  . DILATION AND CURETTAGE OF UTERUS    . ESOPHAGOGASTRODUODENOSCOPY (EGD) WITH PROPOFOL N/A 04/06/2016   Procedure: ESOPHAGOGASTRODUODENOSCOPY (EGD) WITH PROPOFOL;  Surgeon: Alphonsa Overall, MD;  Location: [facility];  Service: General;  Laterality: N/A;  . ESOPHAGOGASTRODUODENOSCOPY (EGD) WITH PROPOFOL N/A 03/02/2017   Procedure: ESOPHAGOGASTRODUODENOSCOPY (EGD) WITH PROPOFOL;  Surgeon: Alphonsa Overall, MD;  Location: [facility];  Service: General;  Laterality: N/A;  . ESOPHAGOGASTRODUODENOSCOPY (EGD) WITH PROPOFOL N/A 05/20/2017   Procedure: ESOPHAGOGASTRODUODENOSCOPY (EGD) WITH PROPOFOL ERAS PATHWAY;  Surgeon: Alphonsa Overall, MD;  Location: [facility];  Service: General;  Laterality: N/A;  . ESOPHAGOGASTRODUODENOSCOPY (EGD) WITH PROPOFOL N/A 10/07/2017   Procedure: ESOPHAGOGASTRODUODENOSCOPY (EGD) WITH PROPOFOL;  Surgeon: Alphonsa Overall, MD;  Location: [facility];  Service: General;  Laterality: N/A;  . EXCISION OF SKIN TAG  06/08/2017   Procedure: EXCISION OF VULVAR SKIN TAGS X2;  Surgeon: [name], MD;  Location: [facility];  Service: Gynecology;;  . FOOT SURGERY Bilateral    x 2  . GASTRIC ROUX-EN-Y N/A 12/14/2016   Procedure: LAPAROSCOPIC REVISION SLEEVE GASTRECTOMY TO  ROUX-Y-GASTRIC BY-PASS, UPPER ENDO;  Surgeon: Excell Seltzer, MD;  Location: [facility];  Service: General;  Laterality: N/A;  . GASTROJEJUNOSTOMY N/A 05/11/2016   Procedure: LAPAROSCOPIC PLACEMENT  OF FEEDING  JEJUNOSTOMY TUBE;  Surgeon: Excell Seltzer, MD;  Location: [facility];  Service: General;  Laterality: N/A;  . HEMORRHOIDECTOMY WITH HEMORRHOID BANDING    . IR FLUORO GUIDE CV LINE RIGHT  04/06/2017  . IR GENERIC HISTORICAL  05/19/2016   IR CM INJ ANY COLONIC TUBE W/FLUORO 05/19/2016 Aletta Edouard, MD [facility]  . IR PATIENT EVAL TECH 0-60 MINS  11/30/2017  . IR REPLC DUODEN/JEJUNO TUBE PERCUT W/FLUORO  03/14/2018  . IR REPLC DUODEN/JEJUNO TUBE  PERCUT W/FLUORO  03/15/2018  . IR US GUIDE VASC ACCESS RIGHT  04/06/2017  . j tube removed march 2018    . KNEE ARTHROSCOPY Left 09/06/2014  . LAPAROSCOPIC GASTRIC SLEEVE RESECTION N/A 01/14/2016   Procedure: LAPAROSCOPIC GASTRIC SLEEVE RESECTION;  Surgeon: Excell Seltzer, MD;  Location: [facility];  Service: General;  Laterality: N/A;  . LAPAROSCOPIC REVISION OF GASTROJEJUNOSTOMY N/A 10/25/2017   Procedure: LAPAROSCOPIC REVISION OF GASTROJEJUNOSTOMY AND PARTIAL GASTRECTOMY, WITH PLACEMENT OF FEEDING GASTROSTOMY TUBE;  Surgeon: Excell Seltzer, MD;  Location: [facility];  Service: General;  Laterality: N/A;  . LAPAROSCOPIC TUBAL LIGATION  10/16/2011   Procedure: LAPAROSCOPIC TUBAL LIGATION;  Surgeon: Alwyn Pea, MD;  Location: [facility];  Service: Gynecology;  Laterality: Bilateral;  . NOVASURE ABLATION  09/28/2010   mild persistent vaginal bleeding  .  right knee arthroscopy     05/07/2016  . TOTAL KNEE ARTHROPLASTY Left 03/10/2018   Procedure: LEFT TOTAL KNEE ARTHROPLASTY;  Surgeon: Paralee Cancel, MD;  Location: [facility];  Service: Orthopedics;  Laterality: Left;  70 mins  . TUBAL LIGATION    . VAGINAL HYSTERECTOMY Bilateral 06/08/2017   Procedure: HYSTERECTOMY VAGINAL W/ BILATERAL SALPINGECTOMY;  Surgeon: [name], MD;  Location: [facility];  Service: Gynecology;  Laterality: Bilateral;  . wisdom teeth extracted       OB History    Gravida  4   Para  4   Term  4   Preterm      AB      Living  3     SAB      TAB      Ectopic      Multiple      Live Births               Home Medications    Prior to Admission medications   Medication Sig Start Date End Date Taking? Authorizing Provider  azithromycin (ZITHROMAX) 250 MG tablet Take 1 tablet (250 mg total) by mouth daily. Take first 2 tablets together, then 1 every day until finished. 08/21/19   Varney Biles, MD  baclofen (LIORESAL) 10 MG tablet TAKE 0.5 TABLETS (5 MG TOTAL) BY MOUTH AT BEDTIME. 08/17/19   Kathrynn Ducking, MD  Buprenorphine HCl (BELBUCA BU) Place inside cheek 4 (four) times daily.    [provider]  buPROPion (WELLBUTRIN XL) 150 MG 24 hr tablet Take 150 mg by mouth daily.    [provider]  busPIRone (BUSPAR) 10 MG tablet Take 10 mg by mouth 3 (three) times daily.    [provider]  Cholecalciferol (VITAMIN D3) 1.25 MG (50000 UT) CAPS TAKE 1 CAPSULE BY MOUTH ONE TIME PER WEEK 04/03/19   [provider]  cyanocobalamin (,VITAMIN B-12,) 1000 MCG/ML injection INJECT 1 ML TWICE A MONTH BY SUBCUTANEOUS ROUTE. 04/06/19   [provider]  diclofenac sodium (VOLTAREN) 1 % GEL Apply 2 g topically 3 (three) times daily.    [provider]  DULoxetine (CYMBALTA) 60 MG capsule Take 1 capsule (60 mg total) by mouth 2 (two) times daily. 04/10/19   Connye Burkitt, NP  Erenumab-aooe (AIMOVIG) 140 MG/ML SOAJ Inject 140 mg into the skin every 30 (thirty) days. 08/07/19   Suzzanne Cloud, NP  fluconazole (DIFLUCAN) 150 MG tablet TAKE 1 TABLET BY MOUTH ONCE. MAY REPEAT IN 24 HOURS IF NEEDED 06/05/19   [name], MD  gabapentin (NEURONTIN) 100 MG capsule Take 1 capsule (100 mg total) by mouth 3 (three) times daily. Patient taking differently: Take 400 mg by mouth 2 (two) times daily. 2 tablets at bedtime 04/10/19   Connye Burkitt, NP  linaclotide [facility]) 290 MCG CAPS capsule Take 290 mcg by mouth daily before breakfast.    [provider]  metoCLOPramide (REGLAN) 10 MG tablet metoclopramide 10 mg tablet  TAKE 1 TABLET (10 MG TOTAL) BY MOUTH 3 (THREE) TIMES DAILY BEFORE MEALS.    [provider]  nortriptyline (PAMELOR) 10 MG capsule Take 3 capsules at bedtime Patient taking differently: Take 2 capsules at bedtime 05/04/19   Suzzanne Cloud, NP  pantoprazole (PROTONIX) 40 MG tablet 180pantoprazole 40 mg tablet,delayed release  TAKE 1 TABLET (40 MG TOTAL) BY MOUTH 2 (TWO) TIMES DAILY BEFORE A MEAL. 07/31/19   Gatha Mayer, MD  terbinafine  (LAMISIL)  250 MG tablet Take 250 mg by mouth daily.    [provider]  thiamine (VITAMIN B-1) 100 MG tablet Take 100 mg by mouth daily at 12 noon.     [provider]    Family History Family History  Problem Relation Age of Onset  . Hypertension Mother   . Diabetes Mother   . Sarcoidosis Mother        lungs and skin  . Asthma Mother   . Hypertension Father   . Diabetes Father   . Asthma Son   . Tics Son   . Asthma Sister   . Cancer Sister        possible pancreatic cancer  . Adrenal disorder Sister        Tumor   . Asthma Brother   . Asthma Daughter        died age 51.5  . Cancer Daughter 4       brain; died age 51.5  . Asthma Son   . Cancer Paternal Aunt        brain, colon, lung and esophagus; unsure of primary   . Breast cancer Paternal Aunt     Social History Social History   Tobacco Use  . Smoking status: Former Smoker    Packs/day: 0.50    Years: 20.00    Pack years: 10.00    Types: Cigarettes    Quit date: 03/17/2014    Years since quitting: 5.4  . Smokeless tobacco: Never Used  Substance Use Topics  . Alcohol use: Yes    Alcohol/week: 0.0 standard drinks  . Drug use: No     Allergies   Coconut oil, Morphine and related, Oxycodone, Ciprofloxacin, Trazodone and nefazodone, Vistaril [hydroxyzine hcl], Doxycycline, Adhesive [tape], and Penicillins   Review of Systems Review of Systems  Constitutional: Positive for activity change and fatigue.  Gastrointestinal: Positive for nausea.  Genitourinary: Negative for dysuria.  Allergic/Immunologic: Negative for immunocompromised state.  All other systems reviewed and are negative.    Physical Exam Updated Vital Signs BP (!) 127/96   Pulse 77   Temp 98.1 F (36.7 C) (Oral)   Resp 14   Ht 5' 6.75" (1.695 m)   Wt 78.8 kg   LMP 06/02/2017 Comment: hysterectomy 06/08/2017  SpO2 95%   BMI 27.43 kg/m   Physical Exam Vitals signs and nursing note reviewed.  Constitutional:       Appearance: She is well-developed.  HENT:     Head: Normocephalic and atraumatic.  Neck:     Musculoskeletal: Normal range of motion and neck supple.  Cardiovascular:     Rate and Rhythm: Normal rate.  Pulmonary:     Effort: Pulmonary effort is normal.  Abdominal:     General: Bowel sounds are normal.  Skin:    General: Skin is warm and dry.  Neurological:     Mental Status: She is alert and oriented to person, place, and time.      ED Treatments / Results  Labs (all labs ordered are listed, but only abnormal results are displayed) Labs Reviewed  I-STAT CHEM 8, ED - Abnormal; Notable for the following components:      Result Value   Glucose, Bld 68 (*)    All other components within normal limits  GROUP A STREP BY PCR  NOVEL CORONAVIRUS, NAA (HOSP ORDER, SEND-OUT TO REF LAB; TAT 18-24 HRS)  CBC WITH DIFFERENTIAL/PLATELET    EKG EKG Interpretation  Date/Time:  Monday August 21 2019 16:10:45  EST Ventricular Rate:  82 PR Interval:    QRS Duration: 85 QT Interval:  364 QTC Calculation: 426 R Axis:   28 Text Interpretation: Sinus rhythm Low voltage, precordial leads No acute changes Confirmed by Varney Biles [phone]) on 08/21/2019 9:10:20 PM   Radiology Dg Chest 2 View  Result Date: 08/21/2019 CLINICAL DATA:  Short of breath EXAM: CHEST - 2 VIEW COMPARISON:  06/14/2017 FINDINGS: Patchy airspace opacity at the left base, possible small pneumonia. Normal heart size. No pneumothorax. Mild scoliosis of the spine. IMPRESSION: Patchy opacity at the left base, possible small pneumonia Electronically Signed   By: Donavan Foil M.D.   On: 08/21/2019 16:47    Procedures Procedures (including critical care time)  Medications Ordered in ED Medications - No data to display   Initial Impression / Assessment and Plan / ED Course  I have reviewed the triage vital signs and the nursing notes.  Pertinent labs & imaging results that were available during my care of the  patient were reviewed by me and considered in my medical decision making (see chart for details).        [name] was evaluated in Emergency Department on 08/21/2019 for the symptoms described in the history of present illness. She was evaluated in the context of the global COVID-19 pandemic, which necessitated consideration that the patient might be at risk for infection with the SARS-CoV-2 virus that causes COVID-19. Institutional protocols and algorithms that pertain to the evaluation of patients at risk for COVID-19 are in a state of rapid change based on information released by regulatory bodies including the CDC and federal and state organizations. These policies and algorithms were followed during the patient's care in the ED.  43 year old comes in a chief complaint of multiple positive constitutional's with clinical concerns for COVID-19 infection, especially since she has been exposed to someone with positive COVID-19.  Rapid strep is negative.  Labs are reassuring. Airway is fine.  Outpatient COVID-19 test has been ordered.  Strict ER return precautions have been discussed, and patient is agreeing with the plan and is comfortable with the workup done and the recommendations from the ER.   Final Clinical Impressions(s) / ED Diagnoses   Final diagnoses:  Community acquired pneumonia of right lung, unspecified part of lung  Suspected COVID-19 virus infection    ED Discharge Orders         Ordered    azithromycin (ZITHROMAX) 250 MG tablet  Daily     08/21/19 2228           Varney Biles, MD 08/21/19 2251

## 2019-08-21 NOTE — ED Triage Notes (Addendum)
Patient c/o SOB, a productive cough with green sputum and occasional streaked with blood-tinged sputum. Patient also c/o nasal congestion.  Writer overheard the patient talking on the phone that her friend was still in quarantine. Patient questioned about this and states she drove her friend home from the hospital and she was covid + and the friend still had 5 more days of quarantine to go.

## 2019-08-23 LAB — NOVEL CORONAVIRUS, NAA (HOSP ORDER, SEND-OUT TO REF LAB; TAT 18-24 HRS): SARS-CoV-2, NAA: DETECTED — AB

## 2019-08-29 ENCOUNTER — Other Ambulatory Visit: Payer: Self-pay

## 2019-08-29 ENCOUNTER — Telehealth (INDEPENDENT_AMBULATORY_CARE_PROVIDER_SITE_OTHER): Payer: 59 | Admitting: Family Medicine

## 2019-08-29 DIAGNOSIS — U071 COVID-19: Secondary | ICD-10-CM | POA: Diagnosis not present

## 2019-08-29 NOTE — Progress Notes (Signed)
Virtual Visit Note  I connected with patient on 08/29/19 at 446pm by phone and verified that I am speaking with the correct person using two identifiers. [name] is currently located at home and patient is currently with them during visit. The provider, Rutherford Guys, MD is located in their office at time of visit.  I discussed the limitations, risks, security and privacy concerns of performing an evaluation and management service by telephone and the availability of in person appointments. I also discussed with the patient that there may be a patient responsible charge related to this service. The patient expressed understanding and agreed to proceed.   CC: covid positive  HPI ? Seen in ER on nov 23 Treated for CAP with azithromycin covid positive  Patient is feeling much better No fever for past 2 days Cough is getting much better, still mildly productive Burning sensation with deep breathing Her nausea and diarrhea have resolved Breathing is back to normal, has been using IS Completed abx Has been in quarantine   Allergies  Allergen Reactions  . Coconut Oil Anaphylaxis and Itching  . Morphine And Related Anaphylaxis and Hives    Pt states that she has tolerated Norco and Dilaudid  . Oxycodone Anaphylaxis  . Ciprofloxacin Itching and Rash  . Trazodone And Nefazodone Rash  . Vistaril [Hydroxyzine Hcl] Hives  . Doxycycline Nausea And Vomiting  . Adhesive [Tape] Rash    And paper tape causes a rash if wearing for a prolong period of time  . Penicillins Rash    Has patient had a PCN reaction causing immediate rash, facial/tongue/throat swelling, SOB or lightheadedness with hypotension: Yes Has patient had a PCN reaction causing severe rash involving mucus membranes or skin necrosis: No Has patient had a PCN reaction that required hospitalization No Has patient had a PCN reaction occurring within the last 10 years: No If all of the above answers are "NO", then  may proceed with Cephalosporin use.    Prior to Admission medications   Medication Sig Start Date End Date Taking? Authorizing Provider  azithromycin (ZITHROMAX) 250 MG tablet Take 1 tablet (250 mg total) by mouth daily. Take first 2 tablets together, then 1 every day until finished. 08/21/19   Varney Biles, MD  baclofen (LIORESAL) 10 MG tablet TAKE 0.5 TABLETS (5 MG TOTAL) BY MOUTH AT BEDTIME. 08/17/19   Kathrynn Ducking, MD  Buprenorphine HCl (BELBUCA BU) Place inside cheek 4 (four) times daily.    [provider]  buPROPion (WELLBUTRIN XL) 150 MG 24 hr tablet Take 150 mg by mouth daily.    [provider]  busPIRone (BUSPAR) 10 MG tablet Take 10 mg by mouth 3 (three) times daily.    [provider]  Cholecalciferol (VITAMIN D3) 1.25 MG (50000 UT) CAPS TAKE 1 CAPSULE BY MOUTH ONE TIME PER WEEK 04/03/19   [provider]  cyanocobalamin (,VITAMIN B-12,) 1000 MCG/ML injection INJECT 1 ML TWICE A MONTH BY SUBCUTANEOUS ROUTE. 04/06/19   [provider]  diclofenac sodium (VOLTAREN) 1 % GEL Apply 2 g topically 3 (three) times daily.    [provider]  DULoxetine (CYMBALTA) 60 MG capsule Take 1 capsule (60 mg total) by mouth 2 (two) times daily. 04/10/19   Connye Burkitt, NP  Erenumab-aooe (AIMOVIG) 140 MG/ML SOAJ Inject 140 mg into the skin every 30 (thirty) days. 08/07/19   Suzzanne Cloud, NP  fluconazole (DIFLUCAN) 150 MG tablet TAKE 1 TABLET BY MOUTH ONCE. MAY  REPEAT IN 24 HOURS IF NEEDED 06/05/19   [name], MD  gabapentin (NEURONTIN) 100 MG capsule Take 1 capsule (100 mg total) by mouth 3 (three) times daily. Patient taking differently: Take 400 mg by mouth 2 (two) times daily. 2 tablets at bedtime 04/10/19   Connye Burkitt, NP  linaclotide [facility]) 290 MCG CAPS capsule Take 290 mcg by mouth daily before breakfast.    [provider]  metoCLOPramide (REGLAN) 10 MG tablet metoclopramide 10 mg tablet  TAKE 1 TABLET (10 MG TOTAL) BY  MOUTH 3 (THREE) TIMES DAILY BEFORE MEALS.    [provider]  nortriptyline (PAMELOR) 10 MG capsule Take 3 capsules at bedtime Patient taking differently: Take 2 capsules at bedtime 05/04/19   Suzzanne Cloud, NP  pantoprazole (PROTONIX) 40 MG tablet 180pantoprazole 40 mg tablet,delayed release  TAKE 1 TABLET (40 MG TOTAL) BY MOUTH 2 (TWO) TIMES DAILY BEFORE A MEAL. 07/31/19   Gatha Mayer, MD  terbinafine (LAMISIL) 250 MG tablet Take 250 mg by mouth daily.    [provider]  thiamine (VITAMIN B-1) 100 MG tablet Take 100 mg by mouth daily at 12 noon.     [provider]    Past Medical History:  Diagnosis Date  . Anal fissure - posterior 10/16/2014    OCC  ISSUES  . Anxiety    doesn't take anything  . Asthma    has Albuterol inhaler as needed  . Boil    on pubic area started septra 03-01-17 draining blood and pus  . Bronchitis   . Chronic headache disorder 07/22/2016  . Clostridium difficile infection 04/20/2012  . Colitis   . Dehydration   . Depression   . Family history of adverse reaction to anesthesia    pt mom gets sick  . GERD (gastroesophageal reflux disease)    takes Pantoprazole daily  . Headache   . Hiatal hernia    neuropathy - mild in arms and legs  . History of blood transfusion    last transfusion was 04/04/2016=Benadryl was given d/t itching. States she always itches with transfusion.   Marland Kitchen History of bronchitis    > 2 yrs ago  . History of colon polyps    benign  . History of migraine    last one 05/01/16  . History of stomach ulcers   . History of urinary tract infection LAST 2 WEEKS AGO  . IDA (iron deficiency anemia)   . Internal and external bleeding hemorrhoids 06/11/2014  . Joint pain   . Joint swelling   . Left sided chronic colitis - segmental 06/11/2014  . Marginal ulcer 10/25/2017  . Migraine   . Motion sickness   . Nausea    takes Zofran as needed  . Nausea and vomiting    for 1 year  . Obesity   . Oligouria   .  Osteoarthritis    lower back, knees, wrists - no meds  . Peripheral neuropathy 03/01/2019  . Pneumonia 1997  . Postoperative nausea and vomiting 01/21/2016   wants scopolamine patch  . Right carpal tunnel syndrome 03/01/2019  . SVD (spontaneous vaginal delivery)    x 4  . Tingling    BOTH LOWER EXTREMETIES ALL THE TIME DUE TO RAPID WEIGHT LOSS  . TOBACCO USER 10/02/2009   Qualifier: Diagnosis of  By: Dimas Millin MD, Ellard Artis    . Transfusion history    last transfusion 6'16   . Tremor 08/31/2018  . UC (ulcerative colitis) ([place])  supposed to be taking Lialda and Bentyl but has been off since gastric sleeve  . Vertigo    doesn't take any meds    Past Surgical History:  Procedure Laterality Date  . ABDOMINAL HYSTERECTOMY     PARTIAL  . COLONOSCOPY  2007   for rectal bleeding; [facility]  . COLONOSCOPY N/A 06/11/2014   Procedure: COLONOSCOPY;  Surgeon: Gatha Mayer, MD;  Location: [facility];  Service: Endoscopy;  Laterality: N/A;  . COLONOSCOPY WITH PROPOFOL N/A 03/02/2017   Procedure: COLONOSCOPY WITH PROPOFOL;  Surgeon: Alphonsa Overall, MD;  Location: [facility];  Service: General;  Laterality: N/A;  . DILATION AND CURETTAGE OF UTERUS    . ESOPHAGOGASTRODUODENOSCOPY (EGD) WITH PROPOFOL N/A 04/06/2016   Procedure: ESOPHAGOGASTRODUODENOSCOPY (EGD) WITH PROPOFOL;  Surgeon: Alphonsa Overall, MD;  Location: [facility];  Service: General;  Laterality: N/A;  . ESOPHAGOGASTRODUODENOSCOPY (EGD) WITH PROPOFOL N/A 03/02/2017   Procedure: ESOPHAGOGASTRODUODENOSCOPY (EGD) WITH PROPOFOL;  Surgeon: Alphonsa Overall, MD;  Location: [facility];  Service: General;  Laterality: N/A;  . ESOPHAGOGASTRODUODENOSCOPY (EGD) WITH PROPOFOL N/A 05/20/2017   Procedure: ESOPHAGOGASTRODUODENOSCOPY (EGD) WITH PROPOFOL ERAS PATHWAY;  Surgeon: Alphonsa Overall, MD;  Location: [facility];  Service: General;  Laterality: N/A;  . ESOPHAGOGASTRODUODENOSCOPY (EGD) WITH PROPOFOL N/A 10/07/2017   Procedure: ESOPHAGOGASTRODUODENOSCOPY (EGD)  WITH PROPOFOL;  Surgeon: Alphonsa Overall, MD;  Location: [facility];  Service: General;  Laterality: N/A;  . EXCISION OF SKIN TAG  06/08/2017   Procedure: EXCISION OF VULVAR SKIN TAGS X2;  Surgeon: [name], MD;  Location: [facility];  Service: Gynecology;;  . FOOT SURGERY Bilateral    x 2  . GASTRIC ROUX-EN-Y N/A 12/14/2016   Procedure: LAPAROSCOPIC REVISION SLEEVE GASTRECTOMY TO  ROUX-Y-GASTRIC BY-PASS, UPPER ENDO;  Surgeon: Excell Seltzer, MD;  Location: [facility];  Service: General;  Laterality: N/A;  . GASTROJEJUNOSTOMY N/A 05/11/2016   Procedure: LAPAROSCOPIC PLACEMENT  OF FEEDING  JEJUNOSTOMY TUBE;  Surgeon: Excell Seltzer, MD;  Location: [facility];  Service: General;  Laterality: N/A;  . HEMORRHOIDECTOMY WITH HEMORRHOID BANDING    . IR FLUORO GUIDE CV LINE RIGHT  04/06/2017  . IR GENERIC HISTORICAL  05/19/2016   IR CM INJ ANY COLONIC TUBE W/FLUORO 05/19/2016 Aletta Edouard, MD [facility]  . IR PATIENT EVAL TECH 0-60 MINS  11/30/2017  . IR REPLC DUODEN/JEJUNO TUBE PERCUT W/FLUORO  03/14/2018  . IR REPLC DUODEN/JEJUNO TUBE PERCUT W/FLUORO  03/15/2018  . IR US GUIDE VASC ACCESS RIGHT  04/06/2017  . j tube removed march 2018    . KNEE ARTHROSCOPY Left 09/06/2014  . LAPAROSCOPIC GASTRIC SLEEVE RESECTION N/A 01/14/2016   Procedure: LAPAROSCOPIC GASTRIC SLEEVE RESECTION;  Surgeon: Excell Seltzer, MD;  Location: [facility];  Service: General;  Laterality: N/A;  . LAPAROSCOPIC REVISION OF GASTROJEJUNOSTOMY N/A 10/25/2017   Procedure: LAPAROSCOPIC REVISION OF GASTROJEJUNOSTOMY AND PARTIAL GASTRECTOMY, WITH PLACEMENT OF FEEDING GASTROSTOMY TUBE;  Surgeon: Excell Seltzer, MD;  Location: [facility];  Service: General;  Laterality: N/A;  . LAPAROSCOPIC TUBAL LIGATION  10/16/2011   Procedure: LAPAROSCOPIC TUBAL LIGATION;  Surgeon: Alwyn Pea, MD;  Location: [facility];  Service: Gynecology;  Laterality: Bilateral;  . NOVASURE ABLATION  09/28/2010   mild persistent vaginal bleeding  . right knee arthroscopy      05/07/2016  . TOTAL KNEE ARTHROPLASTY Left 03/10/2018   Procedure: LEFT TOTAL KNEE ARTHROPLASTY;  Surgeon: Paralee Cancel, MD;  Location: [facility];  Service: Orthopedics;  Laterality: Left;  70 mins  . TUBAL LIGATION    .  VAGINAL HYSTERECTOMY Bilateral 06/08/2017   Procedure: HYSTERECTOMY VAGINAL W/ BILATERAL SALPINGECTOMY;  Surgeon: [name], MD;  Location: [facility];  Service: Gynecology;  Laterality: Bilateral;  . wisdom teeth extracted      Social History   Tobacco Use  . Smoking status: Former Smoker    Packs/day: 0.50    Years: 20.00    Pack years: 10.00    Types: Cigarettes    Quit date: 03/17/2014    Years since quitting: 5.4  . Smokeless tobacco: Never Used  Substance Use Topics  . Alcohol use: Yes    Alcohol/week: 0.0 standard drinks    Family History  Problem Relation Age of Onset  . Hypertension Mother   . Diabetes Mother   . Sarcoidosis Mother        lungs and skin  . Asthma Mother   . Hypertension Father   . Diabetes Father   . Asthma Son   . Tics Son   . Asthma Sister   . Cancer Sister        possible pancreatic cancer  . Adrenal disorder Sister        Tumor   . Asthma Brother   . Asthma Daughter        died age 19.5  . Cancer Daughter 4       brain; died age 19.5  . Asthma Son   . Cancer Paternal Aunt        brain, colon, lung and esophagus; unsure of primary   . Breast cancer Paternal Aunt     ROS Per hpi  Objective  Vitals as reported by the patient: Gen: AAOx3, NAD Resp: speaking in full sentences, breathing comfortably   ASSESSMENT and PLAN  1. COVID-19 virus infection Recovering well. Reviewed quarantine guidelines.   FOLLOW-UP: prn   The above assessment and management plan was discussed with the patient. The patient verbalized understanding of and has agreed to the management plan. Patient is aware to call the clinic if symptoms persist or worsen. Patient is aware when to return to the clinic for a follow-up visit. Patient  educated on when it is appropriate to go to the emergency department.    I provided 7 minutes of non-face-to-face time during this encounter.  Rutherford Guys, MD [facility] [place], [place] [address] Ph.  [phone] Fax [phone]

## 2019-08-29 NOTE — Progress Notes (Signed)
Pt has covid and pneumonia in the right lung. She was not able to talk long due to the uncomfortable state. Medication verified

## 2019-10-17 ENCOUNTER — Ambulatory Visit: Payer: 59 | Admitting: Neurology

## 2019-10-25 DIAGNOSIS — M545 Low back pain, unspecified: Secondary | ICD-10-CM | POA: Insufficient documentation

## 2019-10-25 DIAGNOSIS — G8929 Other chronic pain: Secondary | ICD-10-CM | POA: Insufficient documentation

## 2019-10-27 DIAGNOSIS — M4300 Spondylolysis, site unspecified: Secondary | ICD-10-CM | POA: Insufficient documentation

## 2019-11-21 ENCOUNTER — Telehealth (INDEPENDENT_AMBULATORY_CARE_PROVIDER_SITE_OTHER): Payer: 59 | Admitting: Family Medicine

## 2019-11-21 ENCOUNTER — Other Ambulatory Visit: Payer: Self-pay

## 2019-11-21 DIAGNOSIS — R109 Unspecified abdominal pain: Secondary | ICD-10-CM

## 2019-11-21 DIAGNOSIS — N10 Acute tubulo-interstitial nephritis: Secondary | ICD-10-CM

## 2019-11-21 DIAGNOSIS — N898 Other specified noninflammatory disorders of vagina: Secondary | ICD-10-CM | POA: Diagnosis not present

## 2019-11-21 DIAGNOSIS — N760 Acute vaginitis: Secondary | ICD-10-CM

## 2019-11-21 DIAGNOSIS — B9689 Other specified bacterial agents as the cause of diseases classified elsewhere: Secondary | ICD-10-CM

## 2019-11-21 DIAGNOSIS — R1084 Generalized abdominal pain: Secondary | ICD-10-CM

## 2019-11-21 LAB — POCT WET + KOH PREP
Trich by wet prep: ABSENT
Yeast by KOH: ABSENT
Yeast by wet prep: ABSENT

## 2019-11-21 LAB — POCT CBC
Granulocyte percent: 69.7 %G (ref 37–80)
HCT, POC: 35.6 % (ref 29–41)
Hemoglobin: 11.9 g/dL (ref 11–14.6)
Lymph, poc: 2.9 (ref 0.6–3.4)
MCH, POC: 29.7 pg (ref 27–31.2)
MCHC: 33.3 g/dL (ref 31.8–35.4)
MCV: 89.1 fL (ref 76–111)
MID (cbc): 0.5 (ref 0–0.9)
MPV: 8.3 fL (ref 0–99.8)
POC Granulocyte: 7.7 — AB (ref 2–6.9)
POC LYMPH PERCENT: 25.8 %L (ref 10–50)
POC MID %: 4.5 %M (ref 0–12)
Platelet Count, POC: 334 10*3/uL (ref 142–424)
RBC: 4 M/uL — AB (ref 4.04–5.48)
RDW, POC: 14.3 %
WBC: 11.1 10*3/uL — AB (ref 4.6–10.2)

## 2019-11-21 LAB — POCT URINALYSIS DIP (MANUAL ENTRY)
Bilirubin, UA: NEGATIVE
Glucose, UA: NEGATIVE mg/dL
Ketones, POC UA: NEGATIVE mg/dL
Nitrite, UA: POSITIVE — AB
Protein Ur, POC: 100 mg/dL — AB
Spec Grav, UA: 1.025 (ref 1.010–1.025)
Urobilinogen, UA: 1 E.U./dL
pH, UA: 6 (ref 5.0–8.0)

## 2019-11-21 MED ORDER — METRONIDAZOLE 500 MG PO TABS: 500.0000 mg | ORAL_TABLET | Freq: Two times a day (BID) | ORAL | 0 refills | Status: DC

## 2019-11-21 MED ORDER — SULFAMETHOXAZOLE-TRIMETHOPRIM 800-160 MG PO TABS: 1.0000 | ORAL_TABLET | Freq: Two times a day (BID) | ORAL | 0 refills | Status: DC

## 2019-11-21 NOTE — Progress Notes (Signed)
Patient ID: [name], female    DOB: [birth year]  Age: 44 y.o. MRN: [id number]  No chief complaint on file.   Subjective:   Patient was originally listed as a telemed visit because she came in and was told to wait in her car.  She has a migraine headache.  But she also has had a lot of flank pain is why she came in.  She had Covid back in November, and denies any other Covid-like symptoms.  Feels confident that this is not what she had before.  I asked them to go ahead and bring her in for me to be able to see her inside the building.  Patient came in today because she has had a week history of some dysuria and abdominal pains.  She has been hurting more more in her flank regions bilaterally.  She has been a little nauseous but not having any major nausea or vomiting.  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  She had gotten severely malnourished from this, but is improved over the past year.  She is not working and is on disability from this.  She has a history of colitis and bleeds rectally a intermittently.  She showed me a photo of the toilet water with obvious blood present.  She has been having some dysuria.  She has had some sweats but no documented fevers.  She also complains of vaginal discharge and odor.  She has had BV in the past.  She used an OTC yeast preparation without success.  She is not currently sexually involved.  She did have Covid back in the fall. Current allergies, medications, problem list, past/family and social histories reviewed.  Objective:  LMP 06/02/2017 Comment: hysterectomy 06/08/2017  Patient is in some distress.  When she gets up from the chair to move to the exam table is obvious that her abdomen is causing discomfort as is her flank.  Bilateral CVA tenderness, mild to moderate.  Abdomen has generalized tenderness diffusely.  Scar palpable in the left upper quadrant.  Bowel sounds were mildly present.  Rectal exam not  done today.  She did the self swab, and vaginal exam not done. Results for orders placed or performed in visit on 11/21/19  POCT urinalysis dipstick  Result Value Ref Range   Color, UA yellow yellow   Clarity, UA cloudy (A) clear   Glucose, UA negative negative mg/dL   Bilirubin, UA negative negative   Ketones, POC UA negative negative mg/dL   Spec Grav, UA 1.025 1.010 - 1.025   Blood, UA small (A) negative   pH, UA 6.0 5.0 - 8.0   Protein Ur, POC =100 (A) negative mg/dL   Urobilinogen, UA 1.0 0.2 or 1.0 E.U./dL   Nitrite, UA Positive (A) Negative   Leukocytes, UA Large (3+) (A) Negative  POCT Wet + KOH Prep  Result Value Ref Range   Yeast by KOH Absent Absent   Yeast by wet prep Absent Absent   WBC by wet prep Moderate (A) Few   Clue Cells Wet Prep HPF POC Few (A) None   Trich by wet prep Absent Absent   Bacteria Wet Prep HPF POC Few Few   Epithelial Cells By [employer] Pref ([facility]FC) Moderate (A) None, Few, Too numerous to count   RBC,UR,HPF,POC None None RBC/hpf  POCT CBC  Result Value Ref Range   WBC 11.1 (A) 4.6 - 10.2 K/uL   Lymph, poc 2.9 0.6 -  3.4   POC LYMPH PERCENT 25.8 10 - 50 %L   MID (cbc) 0.5 0 - 0.9   POC MID % 4.5 0 - 12 %M   POC Granulocyte 7.7 (A) 2 - 6.9   Granulocyte percent 69.7 37 - 80 %G   RBC 4.00 (A) 4.04 - 5.48 M/uL   Hemoglobin 11.9 11 - 14.6 g/dL   HCT, POC 35.6 29 - 41 %   MCV 89.1 76 - 111 fL   MCH, POC 29.7 27 - 31.2 pg   MCHC 33.3 31.8 - 35.4 g/dL   RDW, POC 14.3 %   Platelet Count, POC 334 142 - 424 K/uL   MPV 8.3 0 - 99.8 fL    Assessment & Plan:   Assessment: 1. Acute pyelonephritis   2. Vaginal discharge   3. Generalized abdominal pain   4. Bilateral flank pain   5. BV (bacterial vaginosis)       Plan: This is most consistent with a pyelonephritis causing the flank pain.  She will be treated with sulfamethoxazole which she took successfully back in the fall.  However she was cautioned that if she gets worse she needs to go to the  hospital because she might need IV antibiotics.  She is allergic to penicillins and Cipro.  She has a few clue cells.  It may or may not be BV, but her symptoms certainly sound like it.  However I said for her to treat the UTI first before taking the Flagyl.  I do not want her to get nausea and vomiting from metronidazole and sulfa both.  She does have some Zofran at home, 8 mg, which she has taken in the past.  See instructions.  Orders Placed This Encounter  Procedures  . Urine Culture  . Comprehensive metabolic panel  . POCT urinalysis dipstick  . POCT Wet + KOH Prep  . POCT CBC    Meds ordered this encounter  Medications  . sulfamethoxazole-trimethoprim (BACTRIM DS) 800-160 MG tablet    Sig: Take 1 tablet by mouth 2 (two) times daily.    Dispense:  20 tablet    Refill:  0  . metroNIDAZOLE (FLAGYL) 500 MG tablet    Sig: Take 1 tablet (500 mg total) by mouth 2 (two) times daily with a meal. DO NOT CONSUME ALCOHOL WHILE TAKING THIS MEDICATION.    Dispense:  14 tablet    Refill:  0         Patient Instructions  You have a urinary tract infection which probably has gone up into the kidneys causing a pyelonephritis.  This being treated with Bactrim DS (sulfamethoxazole) 1 twice daily.  Take with food.  In the event of developing fever or chills or vomiting or get worse generalized symptoms, go to the emergency room because you might have to be admitted and treated with IV antibiotics.  Drink plenty of fluids.  Flagyl (Metronidazole) when stomach feels better, for BV    No follow-ups on file.   Ruben Reason, MD 11/21/2019

## 2019-11-21 NOTE — Patient Instructions (Addendum)
You have a urinary tract infection which probably has gone up into the kidneys causing a pyelonephritis.  This being treated with Bactrim DS (sulfamethoxazole) 1 twice daily.  Take with food.  In the event of developing fever or chills or vomiting or get worse generalized symptoms, go to the emergency room because you might have to be admitted and treated with IV antibiotics.  Drink plenty of fluids.  Flagyl (Metronidazole) when stomach feels better, for BV  Advise following up with a gastroenterologist for your colitis history and rectal bleeding.  Use the Zofran you have at home if needed (ondansetron)

## 2019-11-21 NOTE — Progress Notes (Signed)
Pt was turned into telemed due to having headache and back pain. Pt is having vaginal discomfort and discharge. Pt says she has already had covid so she does not believe the symptoms she is having is covid related

## 2019-11-22 LAB — COMPREHENSIVE METABOLIC PANEL
ALT: 6 IU/L (ref 0–32)
AST: 13 IU/L (ref 0–40)
Albumin/Globulin Ratio: 1.6 (ref 1.2–2.2)
Albumin: 3.8 g/dL (ref 3.8–4.8)
Alkaline Phosphatase: 77 IU/L (ref 39–117)
BUN/Creatinine Ratio: 8 — ABNORMAL LOW (ref 9–23)
BUN: 6 mg/dL (ref 6–24)
Bilirubin Total: <0.2 mg/dL (ref 0.0–1.2)
CO2: 22 mmol/L (ref 20–29)
Calcium: 9.2 mg/dL (ref 8.7–10.2)
Chloride: 105 mmol/L (ref 96–106)
Creatinine, Ser: 0.79 mg/dL (ref 0.57–1.00)
GFR calc Af Amer: 108 mL/min/{1.73_m2} (ref >59)
GFR calc non Af Amer: 93 mL/min/{1.73_m2} (ref >59)
Globulin, Total: 2.4 g/dL (ref 1.5–4.5)
Glucose: 71 mg/dL (ref 65–99)
Potassium: 4.5 mmol/L (ref 3.5–5.2)
Sodium: 141 mmol/L (ref 134–144)
Total Protein: 6.2 g/dL (ref 6.0–8.5)

## 2019-11-24 LAB — URINE CULTURE

## 2019-11-24 NOTE — Progress Notes (Signed)
I have called and informed pt of their normal lab results.

## 2019-11-24 NOTE — Progress Notes (Signed)
Lm for pt to call bk for results

## 2019-12-28 ENCOUNTER — Other Ambulatory Visit: Payer: Self-pay | Admitting: Family Medicine

## 2019-12-28 DIAGNOSIS — N760 Acute vaginitis: Secondary | ICD-10-CM

## 2019-12-28 DIAGNOSIS — N898 Other specified noninflammatory disorders of vagina: Secondary | ICD-10-CM

## 2019-12-28 DIAGNOSIS — B9689 Other specified bacterial agents as the cause of diseases classified elsewhere: Secondary | ICD-10-CM

## 2019-12-28 DIAGNOSIS — R109 Unspecified abdominal pain: Secondary | ICD-10-CM

## 2019-12-28 DIAGNOSIS — N10 Acute tubulo-interstitial nephritis: Secondary | ICD-10-CM

## 2019-12-28 DIAGNOSIS — R1084 Generalized abdominal pain: Secondary | ICD-10-CM

## 2020-01-02 NOTE — Telephone Encounter (Signed)
Appointment scheduled.

## 2020-01-03 ENCOUNTER — Other Ambulatory Visit: Payer: Self-pay

## 2020-01-03 ENCOUNTER — Encounter: Payer: 59 | Admitting: Family Medicine

## 2020-01-03 ENCOUNTER — Encounter: Payer: Self-pay | Admitting: Family Medicine

## 2020-01-03 NOTE — Patient Instructions (Signed)
° ° ° °  If you have lab work done today you will be contacted with your lab results within the next 2 weeks.  If you have not heard from us then please contact us. The fastest way to get your results is to register for My Chart. ° ° °IF you received an x-ray today, you will receive an invoice from [facility]. Please contact [facility] at [phone] with questions or concerns regarding your invoice.  ° °IF you received labwork today, you will receive an invoice from [facility]. Please contact [facility] at [phone] with questions or concerns regarding your invoice.  ° °Our billing staff will not be able to assist you with questions regarding bills from these companies. ° °You will be contacted with the lab results as soon as they are available. The fastest way to get your results is to activate your My Chart account. Instructions are located on the last page of this paperwork. If you have not heard from us regarding the results in 2 weeks, please contact this office. °  ° ° ° °

## 2020-01-04 ENCOUNTER — Ambulatory Visit: Payer: 59 | Attending: Internal Medicine

## 2020-01-04 DIAGNOSIS — Z23 Encounter for immunization: Secondary | ICD-10-CM

## 2020-01-04 NOTE — Progress Notes (Signed)
   Covid-19 Vaccination Clinic  Name:  [name]    MRN: [id number] DOB: 05/17/1976  01/04/2020  [name] was observed post Covid-19 immunization for 15 minutes without incident. She was provided with Vaccine Information Sheet and instruction to access the V-Safe system.   [name] was instructed to call 911 with any severe reactions post vaccine: Marland Kitchen Difficulty breathing  . Swelling of face and throat  . A fast heartbeat  . A bad rash all over body  . Dizziness and weakness   Immunizations Administered    Name Date Dose VIS Date Route   Pfizer COVID-19 Vaccine 01/04/2020 10:28 AM 0.3 mL 09/08/2019 Intramuscular   Manufacturer: [place]   Lot: [id number]   [place]: [id number]

## 2020-01-05 ENCOUNTER — Ambulatory Visit: Payer: 59 | Admitting: Adult Health Nurse Practitioner

## 2020-01-05 ENCOUNTER — Other Ambulatory Visit: Payer: Self-pay

## 2020-01-05 ENCOUNTER — Encounter: Payer: Self-pay | Admitting: Adult Health Nurse Practitioner

## 2020-01-05 DIAGNOSIS — Z87898 Personal history of other specified conditions: Secondary | ICD-10-CM

## 2020-01-05 DIAGNOSIS — R350 Frequency of micturition: Secondary | ICD-10-CM | POA: Diagnosis not present

## 2020-01-05 DIAGNOSIS — B379 Candidiasis, unspecified: Secondary | ICD-10-CM

## 2020-01-05 DIAGNOSIS — R300 Dysuria: Secondary | ICD-10-CM | POA: Diagnosis not present

## 2020-01-05 LAB — POC MICROSCOPIC URINALYSIS (UMFC)

## 2020-01-05 LAB — POCT WET + KOH PREP
Trich by wet prep: ABSENT
Yeast by KOH: ABSENT
Yeast by wet prep: ABSENT

## 2020-01-05 LAB — POCT URINALYSIS DIP (MANUAL ENTRY)
Bilirubin, UA: NEGATIVE
Blood, UA: NEGATIVE
Glucose, UA: NEGATIVE mg/dL
Ketones, POC UA: NEGATIVE mg/dL
Leukocytes, UA: NEGATIVE
Nitrite, UA: NEGATIVE
Protein Ur, POC: NEGATIVE mg/dL
Spec Grav, UA: 1.025 (ref 1.010–1.025)
Urobilinogen, UA: 0.2 E.U./dL
pH, UA: 5.5 (ref 5.0–8.0)

## 2020-01-05 MED ORDER — FLUCONAZOLE 150 MG PO TABS: 150.0000 mg | ORAL_TABLET | Freq: Every day | ORAL | 3 refills | Status: AC

## 2020-01-05 NOTE — Patient Instructions (Signed)
° ° ° °  If you have lab work done today you will be contacted with your lab results within the next 2 weeks.  If you have not heard from us then please contact us. The fastest way to get your results is to register for My Chart. ° ° °IF you received an x-ray today, you will receive an invoice from [facility]. Please contact  Radiology at [phone] with questions or concerns regarding your invoice.  ° °IF you received labwork today, you will receive an invoice from [facility]. Please contact [facility] at [phone] with questions or concerns regarding your invoice.  ° °Our billing staff will not be able to assist you with questions regarding bills from these companies. ° °You will be contacted with the lab results as soon as they are available. The fastest way to get your results is to activate your My Chart account. Instructions are located on the last page of this paperwork. If you have not heard from us regarding the results in 2 weeks, please contact this office. °  ° ° ° °

## 2020-01-07 LAB — URINE CULTURE

## 2020-01-14 ENCOUNTER — Other Ambulatory Visit: Payer: Self-pay | Admitting: Internal Medicine

## 2020-01-15 ENCOUNTER — Other Ambulatory Visit: Payer: Self-pay | Admitting: Endocrinology

## 2020-01-15 DIAGNOSIS — N6310 Unspecified lump in the right breast, unspecified quadrant: Secondary | ICD-10-CM

## 2020-01-15 DIAGNOSIS — Z1231 Encounter for screening mammogram for malignant neoplasm of breast: Secondary | ICD-10-CM

## 2020-01-16 ENCOUNTER — Other Ambulatory Visit (HOSPITAL_COMMUNITY): Payer: 59

## 2020-01-16 ENCOUNTER — Encounter (HOSPITAL_COMMUNITY): Payer: Self-pay

## 2020-01-16 ENCOUNTER — Encounter (HOSPITAL_COMMUNITY)
Admission: RE | Admit: 2020-01-16 | Discharge: 2020-01-16 | Disposition: A | Discharge status: Home / Self Care | Payer: 59 | Source: Ambulatory Visit | Attending: Orthopedic Surgery | Admitting: Orthopedic Surgery

## 2020-01-16 ENCOUNTER — Other Ambulatory Visit: Payer: Self-pay

## 2020-01-16 DIAGNOSIS — Z01812 Encounter for preprocedural laboratory examination: Secondary | ICD-10-CM | POA: Insufficient documentation

## 2020-01-16 LAB — CBC
HCT: 37.1 % (ref 36.0–46.0)
Hemoglobin: 11.3 g/dL — ABNORMAL LOW (ref 12.0–15.0)
MCH: 29.4 pg (ref 26.0–34.0)
MCHC: 30.5 g/dL (ref 30.0–36.0)
MCV: 96.6 fL (ref 80.0–100.0)
Platelets: 279 10*3/uL (ref 150–400)
RBC: 3.84 MIL/uL — ABNORMAL LOW (ref 3.87–5.11)
RDW: 12.4 % (ref 11.5–15.5)
WBC: 7.7 10*3/uL (ref 4.0–10.5)
nRBC: 0 % (ref 0.0–0.2)

## 2020-01-16 LAB — SURGICAL PCR SCREEN
MRSA, PCR: NEGATIVE
Staphylococcus aureus: NEGATIVE

## 2020-01-16 NOTE — Progress Notes (Addendum)
Can you place orders for the upcoming surgery.Pt. has PST appointment on 01/16/20. Thank you.

## 2020-01-16 NOTE — Patient Instructions (Addendum)
DUE TO COVID-19 ONLY ONE VISITOR IS ALLOWED TO COME WITH YOU AND STAY IN THE WAITING ROOM ONLY DURING PRE OP AND PROCEDURE DAY OF SURGERY. THE 1 VISITOR MAY VISIT WITH YOU AFTER SURGERY IN YOUR PRIVATE ROOM DURING VISITING HOURS ONLY!  YOU NEED TO HAVE A COVID 19 TEST ON: 01/19/20 @ 2:00 pm , THIS TEST MUST BE DONE BEFORE SURGERY, COME  [place], [place] , [address].  ([place]) ONCE YOUR COVID TEST IS COMPLETED, PLEASE BEGIN THE QUARANTINE INSTRUCTIONS AS OUTLINED IN YOUR HANDOUT.                [name]   Your procedure is scheduled on: 01/23/20   Report to [facility] Main  Entrance   Report to admitting at: 11:45 AM     Call this number if you have problems the morning of surgery [phone]    Remember:    NO SOLID FOOD AFTER MIDNIGHT THE NIGHT PRIOR TO SURGERY. NOTHING BY MOUTH EXCEPT CLEAR LIQUIDS UNTIL: 11:00 am .   CLEAR LIQUID DIET   Foods Allowed                                                                     Foods Excluded  Coffee and tea, regular and decaf                             liquids that you cannot  Plain Jell-O any favor except red or purple                                           see through such as: Fruit ices (not with fruit pulp)                                     milk, soups, orange juice  Iced Popsicles                                    All solid food Carbonated beverages, regular and diet                                    Cranberry, grape and apple juices Sports drinks like Gatorade Lightly seasoned clear broth or consume(fat free) Sugar, honey syrup  Sample Menu Breakfast                                Lunch                                     Supper Cranberry juice                    Beef broth  Chicken broth Jell-O                                     Grape juice                           Apple juice Coffee or tea                        Jell-O                                       Popsicle                                                Coffee or tea                        Coffee or tea  _____________________________________________________________________   BRUSH YOUR TEETH MORNING OF SURGERY AND RINSE YOUR MOUTH OUT, NO CHEWING GUM CANDY OR MINTS.     Take these medicines the morning of surgery with A SIP OF WATER: Bupropion,buspirone,duloxetine,reglan,pantoprazole,linaclotide.Use inhalers and Flonase as usual.                                 You may not have any metal on your body including hair pins and              piercings  Do not wear jewelry, make-up, lotions, powders or perfumes, deodorant             Do not wear nail polish on your fingernails.  Do not shave  48 hours prior to surgery.              Men may shave face and neck.   Do not bring valuables to the hospital. [place].  Contacts, dentures or bridgework may not be worn into surgery.  Leave suitcase in the car. After surgery it may be brought to your room.     Patients discharged the day of surgery will not be allowed to drive home. IF YOU ARE HAVING SURGERY AND GOING HOME THE SAME DAY, YOU MUST HAVE AN ADULT TO DRIVE YOU HOME AND BE WITH YOU FOR 24 HOURS. YOU MAY GO HOME BY TAXI OR UBER OR ORTHERWISE, BUT AN ADULT MUST ACCOMPANY YOU HOME AND STAY WITH YOU FOR 24 HOURS.  Name and phone number of your driver:  Special Instructions: N/A              Please read over the following fact sheets you were given: _____________________________________________________________________             [facility] - Preparing for Surgery Before surgery, you can play an important role.  Because skin is not sterile, your skin needs to be as free of germs as possible.  You can reduce the number of germs on your skin by washing with CHG (chlorahexidine gluconate) soap before surgery.  CHG is an  antiseptic cleaner which kills germs and bonds with the  skin to continue killing germs even after washing. Please DO NOT use if you have an allergy to CHG or antibacterial soaps.  If your skin becomes reddened/irritated stop using the CHG and inform your nurse when you arrive at Short Stay. Do not shave (including legs and underarms) for at least 48 hours prior to the first CHG shower.  You may shave your face/neck. Please follow these instructions carefully:  1.  Shower with CHG Soap the night before surgery and the  morning of Surgery.  2.  If you choose to wash your hair, wash your hair first as usual with your  normal  shampoo.  3.  After you shampoo, rinse your hair and body thoroughly to remove the  shampoo.                           4.  Use CHG as you would any other liquid soap.  You can apply chg directly  to the skin and wash                       Gently with a scrungie or clean washcloth.  5.  Apply the CHG Soap to your body ONLY FROM THE NECK DOWN.   Do not use on face/ open                           Wound or open sores. Avoid contact with eyes, ears mouth and genitals (private parts).                       Wash face,  Genitals (private parts) with your normal soap.             6.  Wash thoroughly, paying special attention to the area where your surgery  will be performed.  7.  Thoroughly rinse your body with warm water from the neck down.  8.  DO NOT shower/wash with your normal soap after using and rinsing off  the CHG Soap.                9.  Pat yourself dry with a clean towel.            10.  Wear clean pajamas.            11.  Place clean sheets on your bed the night of your first shower and do not  sleep with pets. Day of Surgery : Do not apply any lotions/deodorants the morning of surgery.  Please wear clean clothes to the hospital/surgery center.  FAILURE TO FOLLOW THESE INSTRUCTIONS MAY RESULT IN THE CANCELLATION OF YOUR SURGERY PATIENT SIGNATURE_________________________________  NURSE  SIGNATURE__________________________________  ________________________________________________________________________

## 2020-01-16 NOTE — Progress Notes (Signed)
PCP - Dr. Pamella Pert I. LOV: 08/07/19 Cardiologist -   Chest x-ray -  EKG -  Stress Test -  ECHO -  Cardiac Cath -   Sleep Study -  CPAP -   Fasting Blood Sugar -  Checks Blood Sugar _____ times a day  Blood Thinner Instructions: Aspirin Instructions: Last Dose:  Anesthesia review:   Patient denies shortness of breath, fever, cough and chest pain at PAT appointment   Patient verbalized understanding of instructions that were given to them at the PAT appointment. Patient was also instructed that they will need to review over the PAT instructions again at home before surgery.

## 2020-01-18 ENCOUNTER — Ambulatory Visit
Admission: RE | Admit: 2020-01-18 | Discharge: 2020-01-18 | Disposition: A | Discharge status: Home / Self Care | Payer: 59 | Source: Ambulatory Visit | Attending: Endocrinology | Admitting: Endocrinology

## 2020-01-18 ENCOUNTER — Other Ambulatory Visit: Payer: Self-pay

## 2020-01-18 DIAGNOSIS — N6310 Unspecified lump in the right breast, unspecified quadrant: Secondary | ICD-10-CM

## 2020-01-19 ENCOUNTER — Other Ambulatory Visit (HOSPITAL_COMMUNITY)
Admission: RE | Admit: 2020-01-19 | Discharge: 2020-01-19 | Disposition: A | Discharge status: Home / Self Care | Payer: 59 | Source: Ambulatory Visit | Attending: Orthopedic Surgery | Admitting: Orthopedic Surgery

## 2020-01-19 DIAGNOSIS — Z01812 Encounter for preprocedural laboratory examination: Secondary | ICD-10-CM | POA: Diagnosis not present

## 2020-01-19 DIAGNOSIS — Z20822 Contact with and (suspected) exposure to covid-19: Secondary | ICD-10-CM | POA: Insufficient documentation

## 2020-01-19 LAB — SARS CORONAVIRUS 2 (TAT 6-24 HRS): SARS Coronavirus 2: NEGATIVE

## 2020-01-22 MED ORDER — GENTAMICIN SULFATE 40 MG/ML IJ SOLN
5.0000 mg/kg | INTRAVENOUS | Status: AC
  Administered 2020-01-23: 350 mg via INTRAVENOUS
  Filled 2020-01-22: qty 8.75

## 2020-01-22 NOTE — Progress Notes (Signed)
Pt. Was notified about the time change for surgery.Pt. is aware that the arriving time is 9:00 am.Also the ERAS time is 8:30 am.

## 2020-01-22 NOTE — Anesthesia Preprocedure Evaluation (Addendum)
Anesthesia Evaluation  Patient identified by MRN, date of birth, ID band Patient awake    Reviewed: Allergy & Precautions, NPO status , Patient's Chart, lab work & pertinent test results  History of Anesthesia Complications (+) PONV and history of anesthetic complications  Airway Mallampati: II  TM Distance: >3 FB Neck ROM: Full    Dental  (+) Dental Advisory Given, Partial Lower, Partial Upper, Chipped   Pulmonary asthma , former smoker,  COVID positive 07/2019 Quit smoking 2015, 10 pack year history    Pulmonary exam normal breath sounds clear to auscultation       Cardiovascular negative cardio ROS Normal cardiovascular exam Rhythm:Regular Rate:Normal     Neuro/Psych  Headaches, PSYCHIATRIC DISORDERS Anxiety Depression    GI/Hepatic Neg liver ROS, hiatal hernia, PUD, GERD  Medicated and Controlled,UC S/p gastric sleeve    Endo/Other  negative endocrine ROS  Renal/GU negative Renal ROS  negative genitourinary   Musculoskeletal  (+) Arthritis , Osteoarthritis,  Right knee OA   Abdominal   Peds  Hematology  (+) Blood dyscrasia, anemia , hct 37.1, plt 279   Anesthesia Other Findings On chronic narcotics   Reproductive/Obstetrics negative OB ROS S/p hysterectomy                             Anesthesia Physical Anesthesia Plan  ASA: III  Anesthesia Plan: MAC, Regional and Spinal   Post-op Pain Management:    Induction:   PONV Risk Score and Plan: 2 and Propofol infusion, TIVA, Scopolamine patch - Pre-op, Midazolam, Ondansetron and Dexamethasone  Airway Management Planned: Natural Airway and Simple Face Mask  Additional Equipment: None  Intra-op Plan:   Post-operative Plan:   Informed Consent: I have reviewed the patients History and Physical, chart, labs and discussed the procedure including the risks, benefits and alternatives for the proposed anesthesia with the patient or  authorized representative who has indicated his/her understanding and acceptance.       Plan Discussed with: CRNA  Anesthesia Plan Comments:        Anesthesia Quick Evaluation

## 2020-01-22 NOTE — H&P (Signed)
TOTAL KNEE ADMISSION H&P  Patient is being admitted for right total knee arthroplasty.  Subjective:  Chief Complaint:right knee pain.  HPI: [name], 44 y.o. female, has a history of pain and functional disability in the right knee due to arthritis and has failed non-surgical conservative treatments for greater than 12 weeks to includeNSAID's and/or analgesics, corticosteriod injections and activity modification.  Onset of symptoms was gradual, starting 3 years ago with gradually worsening course since that time. The patient noted prior procedures on the knee to include  arthroscopy and menisectomy on the right knee(s).  Patient currently rates pain in the right knee(s) at 9 out of 10 with activity. Patient has worsening of pain with activity and weight bearing and pain that interferes with activities of daily living.  Patient has evidence of joint space narrowing by imaging studies.There is no active infection.  Patient Active Problem List   Diagnosis Date Noted  . 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  . Chronic low back pain 10/25/2019  . Headache 05/04/2019  . Gait instability 05/04/2019  . Suicidal ideations   . MDD (major depressive disorder), recurrent severe, without psychosis ([place]) 04/08/2019  . Peripheral neuropathy 03/01/2019  . Right carpal tunnel syndrome 03/01/2019  . Chronic constipation 01/26/2019  . 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 B12 deficiency 12/01/2017  . Malnutrition, calorie ([place]) 03/27/2017  . Psychophysiological insomnia 03/18/2017  . Chronic headache disorder 07/22/2016  . Memory difficulty 07/22/2016  . 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 Anemia, iron deficiency 10/01/2015  . Left sided chronic colitis - segmental 06/11/2014  . Asthma 04/26/2012  . Reactive depression 10/02/2009   Past Medical History:  Diagnosis Date  . Anal fissure - posterior 10/16/2014    OCC  ISSUES  . Anxiety    doesn't take anything  . Asthma    has Albuterol  inhaler as needed  . Boil    on pubic area started septra 03-01-17 draining blood and pus  . Bronchitis   . Chronic headache disorder 07/22/2016  . Clostridium difficile infection 04/20/2012  . Colitis   . Dehydration   . Depression   . Family history of adverse reaction to anesthesia    pt mom gets sick  . GERD (gastroesophageal reflux disease)    takes Pantoprazole daily  . Headache   . Hiatal hernia    neuropathy - mild in arms and legs  . History of blood transfusion    last transfusion was 04/04/2016=Benadryl was given d/t itching. States she always itches with transfusion.   Marland Kitchen History of bronchitis    > 2 yrs ago  . History of colon polyps    benign  . History of migraine    last one 05/01/16  . History of stomach ulcers   . History of urinary tract infection LAST 2 WEEKS AGO  . IDA (iron deficiency anemia)   . Internal and external bleeding hemorrhoids 06/11/2014  . Joint pain   . Joint swelling   . Left sided chronic colitis - segmental 06/11/2014  . Marginal ulcer 10/25/2017  . Migraine   . Motion sickness   . Nausea    takes Zofran as needed  . Nausea and vomiting    for 1 year  . Obesity   . Oligouria   . Osteoarthritis    lower back, knees, wrists - no meds  . Peripheral neuropathy 03/01/2019  . Pneumonia 1997  . Postoperative nausea and vomiting 01/21/2016   wants scopolamine patch  . Right carpal  tunnel syndrome 03/01/2019  . SVD (spontaneous vaginal delivery)    x 4  . Tingling    BOTH LOWER EXTREMETIES ALL THE TIME DUE TO RAPID WEIGHT LOSS  . TOBACCO USER 10/02/2009   Qualifier: Diagnosis of  By: Dimas Millin MD, Ellard Artis    . Transfusion history    last transfusion 6'16   . Tremor 08/31/2018  . UC (ulcerative colitis) ([place])    supposed to be taking Lialda and Bentyl but has been off since gastric sleeve  . Vertigo    doesn't take any meds    Past Surgical History:  Procedure Laterality Date  . ABDOMINAL HYSTERECTOMY     PARTIAL  . COLONOSCOPY  2007   for rectal  bleeding; [facility]  . COLONOSCOPY N/A 06/11/2014   Procedure: COLONOSCOPY;  Surgeon: Gatha Mayer, MD;  Location: [facility];  Service: Endoscopy;  Laterality: N/A;  . COLONOSCOPY WITH PROPOFOL N/A 03/02/2017   Procedure: COLONOSCOPY WITH PROPOFOL;  Surgeon: Alphonsa Overall, MD;  Location: [facility];  Service: General;  Laterality: N/A;  . DILATION AND CURETTAGE OF UTERUS    . ESOPHAGOGASTRODUODENOSCOPY (EGD) WITH PROPOFOL N/A 04/06/2016   Procedure: ESOPHAGOGASTRODUODENOSCOPY (EGD) WITH PROPOFOL;  Surgeon: Alphonsa Overall, MD;  Location: [facility];  Service: General;  Laterality: N/A;  . ESOPHAGOGASTRODUODENOSCOPY (EGD) WITH PROPOFOL N/A 03/02/2017   Procedure: ESOPHAGOGASTRODUODENOSCOPY (EGD) WITH PROPOFOL;  Surgeon: Alphonsa Overall, MD;  Location: [facility];  Service: General;  Laterality: N/A;  . ESOPHAGOGASTRODUODENOSCOPY (EGD) WITH PROPOFOL N/A 05/20/2017   Procedure: ESOPHAGOGASTRODUODENOSCOPY (EGD) WITH PROPOFOL ERAS PATHWAY;  Surgeon: Alphonsa Overall, MD;  Location: [facility];  Service: General;  Laterality: N/A;  . ESOPHAGOGASTRODUODENOSCOPY (EGD) WITH PROPOFOL N/A 10/07/2017   Procedure: ESOPHAGOGASTRODUODENOSCOPY (EGD) WITH PROPOFOL;  Surgeon: Alphonsa Overall, MD;  Location: [facility];  Service: General;  Laterality: N/A;  . EXCISION OF SKIN TAG  06/08/2017   Procedure: EXCISION OF VULVAR SKIN TAGS X2;  Surgeon: [name], MD;  Location: [facility];  Service: Gynecology;;  . FOOT SURGERY Bilateral    x 2  . GASTRIC ROUX-EN-Y N/A 12/14/2016   Procedure: LAPAROSCOPIC REVISION SLEEVE GASTRECTOMY TO  ROUX-Y-GASTRIC BY-PASS, UPPER ENDO;  Surgeon: Excell Seltzer, MD;  Location: [facility];  Service: General;  Laterality: N/A;  . GASTROJEJUNOSTOMY N/A 05/11/2016   Procedure: LAPAROSCOPIC PLACEMENT  OF FEEDING  JEJUNOSTOMY TUBE;  Surgeon: Excell Seltzer, MD;  Location: [facility];  Service: General;  Laterality: N/A;  . HEMORRHOIDECTOMY WITH HEMORRHOID BANDING    . IR FLUORO GUIDE CV LINE  RIGHT  04/06/2017  . IR GENERIC HISTORICAL  05/19/2016   IR CM INJ ANY COLONIC TUBE W/FLUORO 05/19/2016 Aletta Edouard, MD [facility]  . IR PATIENT EVAL TECH 0-60 MINS  11/30/2017  . IR REPLC DUODEN/JEJUNO TUBE PERCUT W/FLUORO  03/14/2018  . IR REPLC DUODEN/JEJUNO TUBE PERCUT W/FLUORO  03/15/2018  . IR US GUIDE VASC ACCESS RIGHT  04/06/2017  . j tube removed march 2018    . KNEE ARTHROSCOPY Left 09/06/2014  . LAPAROSCOPIC GASTRIC SLEEVE RESECTION N/A 01/14/2016   Procedure: LAPAROSCOPIC GASTRIC SLEEVE RESECTION;  Surgeon: Excell Seltzer, MD;  Location: [facility];  Service: General;  Laterality: N/A;  . LAPAROSCOPIC REVISION OF GASTROJEJUNOSTOMY N/A 10/25/2017   Procedure: LAPAROSCOPIC REVISION OF GASTROJEJUNOSTOMY AND PARTIAL GASTRECTOMY, WITH PLACEMENT OF FEEDING GASTROSTOMY TUBE;  Surgeon: Excell Seltzer, MD;  Location: [facility];  Service: General;  Laterality: N/A;  . LAPAROSCOPIC TUBAL LIGATION  10/16/2011   Procedure: LAPAROSCOPIC TUBAL LIGATION;  Surgeon: Dede Query Rivard,  MD;  Location: [facility];  Service: Gynecology;  Laterality: Bilateral;  . NOVASURE ABLATION  09/28/2010   mild persistent vaginal bleeding  . right knee arthroscopy     05/07/2016  . TOTAL KNEE ARTHROPLASTY Left 03/10/2018   Procedure: LEFT TOTAL KNEE ARTHROPLASTY;  Surgeon: Paralee Cancel, MD;  Location: [facility];  Service: Orthopedics;  Laterality: Left;  70 mins  . TUBAL LIGATION    . VAGINAL HYSTERECTOMY Bilateral 06/08/2017   Procedure: HYSTERECTOMY VAGINAL W/ BILATERAL SALPINGECTOMY;  Surgeon: [name], MD;  Location: [facility];  Service: Gynecology;  Laterality: Bilateral;  . wisdom teeth extracted      Current Facility-Administered Medications  Medication Dose Route Frequency Provider Last Rate Last Admin  . [START ON 01/23/2020] gentamicin (GARAMYCIN) 350 mg in dextrose 5 % 100 mL IVPB  5 mg/kg (Adjusted) Intravenous On Call to OR Paralee Cancel, MD       Current Outpatient Medications  Medication Sig Dispense Refill  Last Dose  . albuterol (VENTOLIN HFA) 108 (90 Base) MCG/ACT inhaler Inhale 2 puffs into the lungs every 6 (six) hours as needed for wheezing or shortness of breath.     . baclofen (LIORESAL) 10 MG tablet TAKE 0.5 TABLETS (5 MG TOTAL) BY MOUTH AT BEDTIME. (Patient taking differently: Take 5 mg by mouth at bedtime. ) 45 tablet 1   . buPROPion (WELLBUTRIN SR) 200 MG 12 hr tablet Take 200 mg by mouth in the morning.      Marland Kitchen buPROPion (WELLBUTRIN XL) 150 MG 24 hr tablet Take 150 mg by mouth in the morning.      . busPIRone (BUSPAR) 10 MG tablet Take 10 mg by mouth 3 (three) times daily.     . Cholecalciferol (VITAMIN D3) 1.25 MG (50000 UT) CAPS Take 50,000 Units by mouth every 7 (seven) days.      . cyanocobalamin (,VITAMIN B-12,) 1000 MCG/ML injection Inject 1,000 mcg into the muscle 2 (two) times a week.      . diclofenac sodium (VOLTAREN) 1 % GEL Apply 2 g topically 3 (three) times daily as needed (pain).      . DULoxetine (CYMBALTA) 60 MG capsule Take 1 capsule (60 mg total) by mouth 2 (two) times daily. 60 capsule 0   . Erenumab-aooe (AIMOVIG) 140 MG/ML SOAJ Inject 140 mg into the skin every 30 (thirty) days. 1 pen 6   . fluticasone (FLONASE) 50 MCG/ACT nasal spray Place 2 sprays into both nostrils daily as needed for allergies or rhinitis.     Marland Kitchen HYDROmorphone (DILAUDID) 4 MG tablet Take 4 mg by mouth every 4 (four) hours as needed for moderate pain or severe pain.      Marland Kitchen linaclotide (LINZESS) 290 MCG CAPS capsule Take 290 mcg by mouth daily before breakfast.     . methocarbamol (ROBAXIN) 500 MG tablet Take 500 mg by mouth every 8 (eight) hours as needed for muscle spasms.      . Multiple Vitamin (MULTIVITAMIN WITH MINERALS) TABS tablet Take 1 tablet by mouth daily.     . pantoprazole (PROTONIX) 40 MG tablet 180pantoprazole 40 mg tablet,delayed release  TAKE 1 TABLET (40 MG TOTAL) BY MOUTH 2 (TWO) TIMES DAILY BEFORE A MEAL. (Patient taking differently: Take 40 mg by mouth 2 (two) times daily before a  meal. ) 180 tablet 1   . thiamine (VITAMIN B-1) 100 MG tablet Take 100 mg by mouth daily at 12 noon.      . fluconazole (DIFLUCAN) 150 MG tablet TAKE  1 TABLET BY MOUTH ONCE. MAY REPEAT IN 24 HOURS IF NEEDED (Patient not taking: Reported on 01/05/2020) 2 tablet 0   . metoCLOPramide (REGLAN) 10 MG tablet TAKE 1 TABLET (10 MG TOTAL) BY MOUTH 3 (THREE) TIMES DAILY BEFORE MEALS. 90 tablet 0   . metroNIDAZOLE (FLAGYL) 500 MG tablet Take 1 tablet (500 mg total) by mouth 2 (two) times daily with a meal. DO NOT CONSUME ALCOHOL WHILE TAKING THIS MEDICATION. (Patient not taking: Reported on 01/03/2020) 14 tablet 0   . nortriptyline (PAMELOR) 10 MG capsule Take 3 capsules at bedtime (Patient not taking: Reported on 01/03/2020) 90 capsule 5   . sulfamethoxazole-trimethoprim (BACTRIM DS) 800-160 MG tablet Take 1 tablet by mouth 2 (two) times daily. (Patient not taking: Reported on 01/03/2020) 20 tablet 0    Allergies  Allergen Reactions  . Coconut Oil Anaphylaxis and Itching  . Morphine And Related Anaphylaxis and Hives    Pt states that she has tolerated Norco and Dilaudid  . Oxycodone Anaphylaxis  . Ciprofloxacin Itching and Rash  . Trazodone And Nefazodone Rash  . Vistaril [Hydroxyzine Hcl] Hives  . Doxycycline Nausea And Vomiting  . Adhesive [Tape] Rash    And paper tape causes a rash if wearing for a prolong period of time  . Penicillins Rash    Has patient had a PCN reaction causing immediate rash, facial/tongue/throat swelling, SOB or lightheadedness with hypotension: Yes Has patient had a PCN reaction causing severe rash involving mucus membranes or skin necrosis: No Has patient had a PCN reaction that required hospitalization No Has patient had a PCN reaction occurring within the last 10 years: No If all of the above answers are "NO", then may proceed with Cephalosporin use.    Social History   Tobacco Use  . Smoking status: Former Smoker    Packs/day: 0.50    Years: 20.00    Pack years: 10.00     Types: Cigarettes    Quit date: 03/17/2014    Years since quitting: 5.8  . Smokeless tobacco: Never Used  Substance Use Topics  . Alcohol use: Yes    Alcohol/week: 0.0 standard drinks    Family History  Problem Relation Age of Onset  . Hypertension Mother   . Diabetes Mother   . Sarcoidosis Mother        lungs and skin  . Asthma Mother   . Hypertension Father   . Diabetes Father   . Asthma Son   . Tics Son   . Asthma Sister   . Cancer Sister        possible pancreatic cancer  . Adrenal disorder Sister        Tumor   . Asthma Brother   . Asthma Daughter        died age 34.5  . Cancer Daughter 4       brain; died age 34.5  . Asthma Son   . Cancer Paternal Aunt        brain, colon, lung and esophagus; unsure of primary   . Breast cancer Paternal Aunt      Review of Systems  Constitutional: Negative for chills and fever.  Respiratory: Negative for cough and shortness of breath.   Cardiovascular: Negative for chest pain and palpitations.  Gastrointestinal: Negative for nausea and vomiting.  Musculoskeletal: Positive for arthralgias.    Objective:  Physical Exam Patient is a 44 year old female.  Well nourished and well developed. General: Alert and oriented x3, cooperative and pleasant, no acute  distress. Head: normocephalic, atraumatic, neck supple. Eyes: EOMI. Respiratory: breath sounds clear in all fields, no wheezing, rales, or rhonchi. Cardiovascular: Regular rate and rhythm, no murmurs, gallops or rubs. Abdomen: non-tender to palpation and soft, normoactive bowel sounds.  Musculoskeletal:  Right knee exam: No palpable effusion, no signs for infection Full knee extension and full flexion Tenderness medially Stable ligaments without pain on stress  Calves soft and nontender. Motor function intact in LE. Strength 5/5 LE bilaterally. Neuro: Distal pulses 2+. Sensation to light touch intact in LE.  Vital signs in last 24 hours:    Labs:  Estimated body  mass index is 27.91 kg/m as calculated from the following:   Height as of 01/16/20: 5' 7"  (1.702 m).   Weight as of 01/16/20: 80.8 kg.   Imaging Review Plain radiographs demonstrate severe degenerative joint disease of the right knee(s). The overall alignment isneutral. The bone quality appears to be adequate for age and reported activity level.  Assessment/Plan:  End stage arthritis, right knee   The patient history, physical examination, clinical judgment of the provider and imaging studies are consistent with end stage degenerative joint disease of the right knee(s) and total knee arthroplasty is deemed medically necessary. The treatment options including medical management, injection therapy arthroscopy and arthroplasty were discussed at length. The risks and benefits of total knee arthroplasty were presented and reviewed. The risks due to aseptic loosening, infection, stiffness, patella tracking problems, thromboembolic complications and other imponderables were discussed. The patient acknowledged the explanation, agreed to proceed with the plan and consent was signed. Patient is being admitted for inpatient treatment for surgery, pain control, PT, OT, prophylactic antibiotics, VTE prophylaxis, progressive ambulation and ADL's and discharge planning. The patient is planning to be discharged home.  Therapy Plans: outpatient therapy at [facility] Disposition: Home with kids Planned DVT Prophylaxis: aspirin 74m BID DME needed: walker PCP/Pain Management: Dr. LLucky CowboyRheumatologist: Dr. SJudie BonusNeurologist: Dr. WJannifer FranklinTXA: IV Allergies: adhesive tape - skin irritation, cipro - rash, doxycycline - vomiting, hydroxyzine - anaphylaxis (okay with benadryl), oxycodone - anaphylaxis Anesthesia Concerns: nausea BMI: 28.7 Not diabetic.  Other: Reports anaphylaxis to Oxycodone, she states she can tolerate Dilaudid, Norco, and Morphine. Dilaudid 4 every 3 hours at  baseline.  Anticipated LOS equal to or greater than 2 midnights due to - Age 6947and older with one or more of the following:  - Obesity  - Expected need for hospital services (PT, OT, Nursing) required for safe  discharge  - Anticipated need for postoperative skilled nursing care or inpatient rehab  - Active co-morbidities: Chronic pain requiring opiods OR   - Unanticipated findings during/Post Surgery: None  - Patient is a high risk of re-admission due to: None   - Patient was instructed on what medications to stop prior to surgery. - Follow-up visit in 2 weeks with Dr. OAlvan Dame- Begin physical therapy following surgery - Pre-operative lab work as pre-surgical testing - Prescriptions will be provided in hospital at time of discharge   AGriffith Citron PA-C Orthopedic Surgery [facility]([phone]

## 2020-01-23 ENCOUNTER — Encounter (HOSPITAL_COMMUNITY)
Admission: RE | Disposition: A | Discharge status: Home / Self Care | Payer: Self-pay | Source: Other Acute Inpatient Hospital | Attending: Orthopedic Surgery

## 2020-01-23 ENCOUNTER — Encounter (HOSPITAL_COMMUNITY): Payer: Self-pay | Admitting: Orthopedic Surgery

## 2020-01-23 ENCOUNTER — Observation Stay (HOSPITAL_COMMUNITY)
Admission: RE | Admit: 2020-01-23 | Discharge: 2020-01-24 | Disposition: A | Discharge status: Home / Self Care | Payer: 59 | Source: Other Acute Inpatient Hospital | Attending: Orthopedic Surgery | Admitting: Orthopedic Surgery

## 2020-01-23 ENCOUNTER — Ambulatory Visit (HOSPITAL_COMMUNITY): Payer: 59 | Admitting: Anesthesiology

## 2020-01-23 ENCOUNTER — Other Ambulatory Visit: Payer: Self-pay

## 2020-01-23 ENCOUNTER — Ambulatory Visit (HOSPITAL_COMMUNITY): Payer: 59 | Admitting: Physician Assistant

## 2020-01-23 DIAGNOSIS — F329 Major depressive disorder, single episode, unspecified: Secondary | ICD-10-CM | POA: Insufficient documentation

## 2020-01-23 DIAGNOSIS — M1711 Unilateral primary osteoarthritis, right knee: Secondary | ICD-10-CM | POA: Diagnosis not present

## 2020-01-23 DIAGNOSIS — Z8616 Personal history of COVID-19: Secondary | ICD-10-CM | POA: Diagnosis not present

## 2020-01-23 DIAGNOSIS — F418 Other specified anxiety disorders: Secondary | ICD-10-CM | POA: Insufficient documentation

## 2020-01-23 DIAGNOSIS — Z881 Allergy status to other antibiotic agents status: Secondary | ICD-10-CM | POA: Insufficient documentation

## 2020-01-23 DIAGNOSIS — G629 Polyneuropathy, unspecified: Secondary | ICD-10-CM | POA: Diagnosis not present

## 2020-01-23 DIAGNOSIS — Z885 Allergy status to narcotic agent status: Secondary | ICD-10-CM | POA: Insufficient documentation

## 2020-01-23 DIAGNOSIS — M7062 Trochanteric bursitis, left hip: Secondary | ICD-10-CM | POA: Insufficient documentation

## 2020-01-23 DIAGNOSIS — Z96652 Presence of left artificial knee joint: Secondary | ICD-10-CM | POA: Insufficient documentation

## 2020-01-23 DIAGNOSIS — Z79899 Other long term (current) drug therapy: Secondary | ICD-10-CM | POA: Diagnosis not present

## 2020-01-23 DIAGNOSIS — Z880 Allergy status to penicillin: Secondary | ICD-10-CM | POA: Diagnosis not present

## 2020-01-23 DIAGNOSIS — K5150 Left sided colitis without complications: Secondary | ICD-10-CM | POA: Insufficient documentation

## 2020-01-23 DIAGNOSIS — K219 Gastro-esophageal reflux disease without esophagitis: Secondary | ICD-10-CM | POA: Insufficient documentation

## 2020-01-23 DIAGNOSIS — E538 Deficiency of other specified B group vitamins: Secondary | ICD-10-CM | POA: Insufficient documentation

## 2020-01-23 DIAGNOSIS — Z87891 Personal history of nicotine dependence: Secondary | ICD-10-CM | POA: Diagnosis not present

## 2020-01-23 DIAGNOSIS — M7061 Trochanteric bursitis, right hip: Secondary | ICD-10-CM | POA: Diagnosis not present

## 2020-01-23 DIAGNOSIS — Z96651 Presence of right artificial knee joint: Secondary | ICD-10-CM

## 2020-01-23 HISTORY — PX: TOTAL KNEE ARTHROPLASTY: SHX125

## 2020-01-23 LAB — PREGNANCY, URINE: Preg Test, Ur: NEGATIVE

## 2020-01-23 LAB — TYPE AND SCREEN
ABO/RH(D): O POS
Antibody Screen: NEGATIVE

## 2020-01-23 SURGERY — ARTHROPLASTY, KNEE, TOTAL
Anesthesia: Monitor Anesthesia Care | Site: Knee | Laterality: Right

## 2020-01-23 MED ORDER — SODIUM CHLORIDE 0.9 % IR SOLN
Status: DC | PRN
  Administered 2020-01-23: 1000 mL

## 2020-01-23 MED ORDER — HYDROMORPHONE HCL 1 MG/ML IJ SOLN: 0.2500 mg | INTRAMUSCULAR | Status: DC | PRN

## 2020-01-23 MED ORDER — LIDOCAINE 2% (20 MG/ML) 5 ML SYRINGE
INTRAMUSCULAR | Status: AC
  Filled 2020-01-23: qty 5

## 2020-01-23 MED ORDER — TRANEXAMIC ACID-NACL 1000-0.7 MG/100ML-% IV SOLN
1000.0000 mg | Freq: Once | INTRAVENOUS | Status: AC
  Administered 2020-01-23: 1000 mg via INTRAVENOUS
  Filled 2020-01-23: qty 100

## 2020-01-23 MED ORDER — SCOPOLAMINE 1 MG/3DAYS TD PT72
1.0000 | MEDICATED_PATCH | TRANSDERMAL | Status: DC
  Administered 2020-01-23: 1.5 mg via TRANSDERMAL

## 2020-01-23 MED ORDER — BACLOFEN 10 MG PO TABS
5.0000 mg | ORAL_TABLET | Freq: Every day | ORAL | Status: DC
  Administered 2020-01-23: 5 mg via ORAL
  Filled 2020-01-23: qty 1

## 2020-01-23 MED ORDER — KETOROLAC TROMETHAMINE 30 MG/ML IJ SOLN
INTRAMUSCULAR | Status: DC | PRN
  Administered 2020-01-23: 30 mg via INTRAVENOUS

## 2020-01-23 MED ORDER — METOCLOPRAMIDE HCL 5 MG/ML IJ SOLN: 5.0000 mg | Freq: Three times a day (TID) | INTRAMUSCULAR | Status: DC | PRN

## 2020-01-23 MED ORDER — DEXAMETHASONE SODIUM PHOSPHATE 10 MG/ML IJ SOLN
INTRAMUSCULAR | Status: DC | PRN
  Administered 2020-01-23 (×2): 10 mg

## 2020-01-23 MED ORDER — VANCOMYCIN HCL IN DEXTROSE 1-5 GM/200ML-% IV SOLN
1000.0000 mg | Freq: Once | INTRAVENOUS | Status: AC
  Administered 2020-01-23: 1000 mg via INTRAVENOUS
  Filled 2020-01-23: qty 200

## 2020-01-23 MED ORDER — FENTANYL CITRATE (PF) 100 MCG/2ML IJ SOLN
50.0000 ug | INTRAMUSCULAR | Status: DC
  Administered 2020-01-23: 100 ug via INTRAVENOUS
  Filled 2020-01-23: qty 2

## 2020-01-23 MED ORDER — KETOROLAC TROMETHAMINE 30 MG/ML IJ SOLN
INTRAMUSCULAR | Status: AC
  Filled 2020-01-23: qty 1

## 2020-01-23 MED ORDER — 0.9 % SODIUM CHLORIDE (POUR BTL) OPTIME
TOPICAL | Status: DC | PRN
  Administered 2020-01-23: 1000 mL

## 2020-01-23 MED ORDER — BUSPIRONE HCL 5 MG PO TABS
10.0000 mg | ORAL_TABLET | Freq: Three times a day (TID) | ORAL | Status: DC
  Administered 2020-01-23 – 2020-01-24 (×3): 10 mg via ORAL
  Filled 2020-01-23 (×3): qty 2

## 2020-01-23 MED ORDER — FLUTICASONE PROPIONATE 50 MCG/ACT NA SUSP
2.0000 | Freq: Every day | NASAL | Status: DC | PRN
  Filled 2020-01-23: qty 16

## 2020-01-23 MED ORDER — DIPHENHYDRAMINE HCL 12.5 MG/5ML PO ELIX: 12.5000 mg | ORAL_SOLUTION | ORAL | Status: DC | PRN

## 2020-01-23 MED ORDER — MEPIVACAINE HCL (PF) 2 % IJ SOLN
INTRAMUSCULAR | Status: AC
  Filled 2020-01-23: qty 40

## 2020-01-23 MED ORDER — MAGNESIUM CITRATE PO SOLN: 1.0000 | Freq: Once | ORAL | Status: DC | PRN

## 2020-01-23 MED ORDER — HYDROMORPHONE HCL 2 MG PO TABS: 4.0000 mg | ORAL_TABLET | ORAL | Status: DC | PRN

## 2020-01-23 MED ORDER — LIDOCAINE HCL (PF) 1 % IJ SOLN
INTRAMUSCULAR | Status: AC
  Filled 2020-01-23: qty 30

## 2020-01-23 MED ORDER — MENTHOL 3 MG MT LOZG: 1.0000 | LOZENGE | OROMUCOSAL | Status: DC | PRN

## 2020-01-23 MED ORDER — KETOROLAC TROMETHAMINE 30 MG/ML IJ SOLN: 30.0000 mg | Freq: Once | INTRAMUSCULAR | Status: DC | PRN

## 2020-01-23 MED ORDER — BUPROPION HCL ER (XL) 150 MG PO TB24
150.0000 mg | ORAL_TABLET | Freq: Every morning | ORAL | Status: DC
  Administered 2020-01-24: 150 mg via ORAL
  Filled 2020-01-23: qty 1

## 2020-01-23 MED ORDER — ONDANSETRON HCL 4 MG PO TABS: 4.0000 mg | ORAL_TABLET | Freq: Four times a day (QID) | ORAL | Status: DC | PRN

## 2020-01-23 MED ORDER — BUPIVACAINE HCL 0.25 % IJ SOLN
INTRAMUSCULAR | Status: DC | PRN
  Administered 2020-01-23: 30 mL

## 2020-01-23 MED ORDER — HYDROMORPHONE HCL 2 MG PO TABS
2.0000 mg | ORAL_TABLET | ORAL | Status: DC | PRN
  Administered 2020-01-23 (×2): 2 mg via ORAL
  Administered 2020-01-24 (×2): 4 mg via ORAL
  Filled 2020-01-23: qty 1
  Filled 2020-01-23 (×2): qty 2
  Filled 2020-01-23: qty 1

## 2020-01-23 MED ORDER — PROPOFOL 1000 MG/100ML IV EMUL
INTRAVENOUS | Status: AC
  Filled 2020-01-23: qty 100

## 2020-01-23 MED ORDER — SODIUM CHLORIDE (PF) 0.9 % IJ SOLN
INTRAMUSCULAR | Status: AC
  Filled 2020-01-23: qty 50

## 2020-01-23 MED ORDER — ACETAMINOPHEN 500 MG PO TABS
1000.0000 mg | ORAL_TABLET | Freq: Once | ORAL | Status: AC
  Administered 2020-01-23: 1000 mg via ORAL
  Filled 2020-01-23: qty 2

## 2020-01-23 MED ORDER — DOCUSATE SODIUM 100 MG PO CAPS
100.0000 mg | ORAL_CAPSULE | Freq: Two times a day (BID) | ORAL | Status: DC
  Administered 2020-01-23 – 2020-01-24 (×2): 100 mg via ORAL
  Filled 2020-01-23 (×2): qty 1

## 2020-01-23 MED ORDER — DULOXETINE HCL 60 MG PO CPEP
60.0000 mg | ORAL_CAPSULE | Freq: Two times a day (BID) | ORAL | Status: DC
  Administered 2020-01-23 – 2020-01-24 (×2): 60 mg via ORAL
  Filled 2020-01-23 (×2): qty 1

## 2020-01-23 MED ORDER — VANCOMYCIN HCL IN DEXTROSE 1-5 GM/200ML-% IV SOLN
1000.0000 mg | INTRAVENOUS | Status: AC
  Administered 2020-01-23: 1000 mg via INTRAVENOUS
  Filled 2020-01-23: qty 200

## 2020-01-23 MED ORDER — PHENOL 1.4 % MT LIQD: 1.0000 | OROMUCOSAL | Status: DC | PRN

## 2020-01-23 MED ORDER — POLYETHYLENE GLYCOL 3350 17 G PO PACK
17.0000 g | PACK | Freq: Two times a day (BID) | ORAL | Status: DC
  Administered 2020-01-23 – 2020-01-24 (×2): 17 g via ORAL
  Filled 2020-01-23 (×2): qty 1

## 2020-01-23 MED ORDER — DEXAMETHASONE SODIUM PHOSPHATE 10 MG/ML IJ SOLN: 10.0000 mg | Freq: Once | INTRAMUSCULAR | Status: DC

## 2020-01-23 MED ORDER — SODIUM CHLORIDE 0.9 % IV SOLN
INTRAVENOUS | Status: DC
  Administered 2020-01-23: 1000 mL via INTRAVENOUS

## 2020-01-23 MED ORDER — ACETAMINOPHEN 325 MG PO TABS: 325.0000 mg | ORAL_TABLET | Freq: Four times a day (QID) | ORAL | Status: DC | PRN

## 2020-01-23 MED ORDER — METHOCARBAMOL 500 MG PO TABS
500.0000 mg | ORAL_TABLET | Freq: Four times a day (QID) | ORAL | Status: DC | PRN
  Administered 2020-01-23 – 2020-01-24 (×2): 500 mg via ORAL
  Filled 2020-01-23 (×3): qty 1

## 2020-01-23 MED ORDER — ASPIRIN 81 MG PO CHEW
81.0000 mg | CHEWABLE_TABLET | Freq: Two times a day (BID) | ORAL | Status: DC
  Administered 2020-01-23 – 2020-01-24 (×2): 81 mg via ORAL
  Filled 2020-01-23 (×2): qty 1

## 2020-01-23 MED ORDER — LIDOCAINE HCL (PF) 1 % IJ SOLN
INTRAMUSCULAR | Status: DC | PRN
  Administered 2020-01-23: 8 mL

## 2020-01-23 MED ORDER — ALUM & MAG HYDROXIDE-SIMETH 200-200-20 MG/5ML PO SUSP: 15.0000 mL | ORAL | Status: DC | PRN

## 2020-01-23 MED ORDER — FERROUS SULFATE 325 (65 FE) MG PO TABS
325.0000 mg | ORAL_TABLET | Freq: Two times a day (BID) | ORAL | Status: DC
  Administered 2020-01-24: 325 mg via ORAL
  Filled 2020-01-23: qty 1

## 2020-01-23 MED ORDER — TRANEXAMIC ACID-NACL 1000-0.7 MG/100ML-% IV SOLN
1000.0000 mg | INTRAVENOUS | Status: AC
  Administered 2020-01-23: 1000 mg via INTRAVENOUS
  Filled 2020-01-23: qty 100

## 2020-01-23 MED ORDER — DEXAMETHASONE SODIUM PHOSPHATE 10 MG/ML IJ SOLN
INTRAMUSCULAR | Status: AC
  Filled 2020-01-23: qty 1

## 2020-01-23 MED ORDER — METHOCARBAMOL 500 MG IVPB - SIMPLE MED
500.0000 mg | Freq: Four times a day (QID) | INTRAVENOUS | Status: DC | PRN
  Filled 2020-01-23: qty 50

## 2020-01-23 MED ORDER — MEPERIDINE HCL 50 MG/ML IJ SOLN: 6.2500 mg | INTRAMUSCULAR | Status: DC | PRN

## 2020-01-23 MED ORDER — BISACODYL 10 MG RE SUPP: 10.0000 mg | Freq: Every day | RECTAL | Status: DC | PRN

## 2020-01-23 MED ORDER — PROPOFOL 500 MG/50ML IV EMUL
INTRAVENOUS | Status: DC | PRN
  Administered 2020-01-23: 100 ug/kg/min via INTRAVENOUS

## 2020-01-23 MED ORDER — ONDANSETRON HCL 4 MG/2ML IJ SOLN
INTRAMUSCULAR | Status: DC | PRN
  Administered 2020-01-23: 4 mg via INTRAVENOUS

## 2020-01-23 MED ORDER — METOCLOPRAMIDE HCL 10 MG PO TABS
10.0000 mg | ORAL_TABLET | Freq: Three times a day (TID) | ORAL | Status: DC
  Administered 2020-01-23 – 2020-01-24 (×2): 10 mg via ORAL
  Filled 2020-01-23 (×2): qty 1

## 2020-01-23 MED ORDER — ONDANSETRON HCL 4 MG/2ML IJ SOLN
INTRAMUSCULAR | Status: AC
  Filled 2020-01-23: qty 2

## 2020-01-23 MED ORDER — SODIUM CHLORIDE (PF) 0.9 % IJ SOLN
INTRAMUSCULAR | Status: DC | PRN
  Administered 2020-01-23: 30 mL via INTRAVENOUS

## 2020-01-23 MED ORDER — ONDANSETRON HCL 4 MG/2ML IJ SOLN: 4.0000 mg | Freq: Four times a day (QID) | INTRAMUSCULAR | Status: DC | PRN

## 2020-01-23 MED ORDER — HYDROMORPHONE HCL 2 MG PO TABS: 6.0000 mg | ORAL_TABLET | ORAL | Status: DC | PRN

## 2020-01-23 MED ORDER — ROPIVACAINE HCL 5 MG/ML IJ SOLN
INTRAMUSCULAR | Status: DC | PRN
  Administered 2020-01-23: 30 mL via PERINEURAL

## 2020-01-23 MED ORDER — MEPIVACAINE HCL (PF) 2 % IJ SOLN
INTRAMUSCULAR | Status: DC | PRN
  Administered 2020-01-23: 3 mL via INTRATHECAL

## 2020-01-23 MED ORDER — POVIDONE-IODINE 10 % EX SWAB
2.0000 "application " | Freq: Once | CUTANEOUS | Status: AC
  Administered 2020-01-23: 2 via TOPICAL

## 2020-01-23 MED ORDER — STERILE WATER FOR IRRIGATION IR SOLN
Status: DC | PRN
  Administered 2020-01-23: 2000 mL

## 2020-01-23 MED ORDER — PROMETHAZINE HCL 25 MG/ML IJ SOLN: 6.2500 mg | INTRAMUSCULAR | Status: DC | PRN

## 2020-01-23 MED ORDER — MIDAZOLAM HCL 2 MG/2ML IJ SOLN
1.0000 mg | INTRAMUSCULAR | Status: DC
  Administered 2020-01-23: 2 mg via INTRAVENOUS
  Filled 2020-01-23: qty 2

## 2020-01-23 MED ORDER — ACETAMINOPHEN 500 MG PO TABS
1000.0000 mg | ORAL_TABLET | Freq: Four times a day (QID) | ORAL | Status: AC
  Administered 2020-01-23 – 2020-01-24 (×4): 1000 mg via ORAL
  Filled 2020-01-23 (×4): qty 2

## 2020-01-23 MED ORDER — ALBUTEROL SULFATE (2.5 MG/3ML) 0.083% IN NEBU: 2.5000 mg | INHALATION_SOLUTION | Freq: Four times a day (QID) | RESPIRATORY_TRACT | Status: DC | PRN

## 2020-01-23 MED ORDER — BUPROPION HCL ER (SR) 100 MG PO TB12
200.0000 mg | ORAL_TABLET | Freq: Every morning | ORAL | Status: DC
  Administered 2020-01-24: 200 mg via ORAL
  Filled 2020-01-23: qty 2

## 2020-01-23 MED ORDER — HYDROCODONE-ACETAMINOPHEN 7.5-325 MG PO TABS: 1.0000 | ORAL_TABLET | Freq: Once | ORAL | Status: DC | PRN

## 2020-01-23 MED ORDER — BUPIVACAINE HCL (PF) 0.25 % IJ SOLN
INTRAMUSCULAR | Status: AC
  Filled 2020-01-23: qty 30

## 2020-01-23 MED ORDER — PANTOPRAZOLE SODIUM 40 MG PO TBEC
40.0000 mg | DELAYED_RELEASE_TABLET | Freq: Two times a day (BID) | ORAL | Status: DC
  Administered 2020-01-23 – 2020-01-24 (×2): 40 mg via ORAL
  Filled 2020-01-23 (×2): qty 1

## 2020-01-23 MED ORDER — METHYLPREDNISOLONE ACETATE 40 MG/ML IJ SUSP
INTRAMUSCULAR | Status: AC
  Filled 2020-01-23: qty 2

## 2020-01-23 MED ORDER — LACTATED RINGERS IV SOLN: INTRAVENOUS | Status: DC

## 2020-01-23 MED ORDER — METOCLOPRAMIDE HCL 5 MG PO TABS: 5.0000 mg | ORAL_TABLET | Freq: Three times a day (TID) | ORAL | Status: DC | PRN

## 2020-01-23 MED ORDER — HYDROMORPHONE HCL 1 MG/ML IJ SOLN
0.5000 mg | INTRAMUSCULAR | Status: DC | PRN
  Administered 2020-01-23: 0.5 mg via INTRAVENOUS
  Administered 2020-01-24: 1 mg via INTRAVENOUS
  Filled 2020-01-23 (×2): qty 1

## 2020-01-23 MED ORDER — SCOPOLAMINE 1 MG/3DAYS TD PT72
MEDICATED_PATCH | TRANSDERMAL | Status: AC
  Filled 2020-01-23: qty 1

## 2020-01-23 MED ORDER — CELECOXIB 200 MG PO CAPS
200.0000 mg | ORAL_CAPSULE | Freq: Two times a day (BID) | ORAL | Status: DC
  Administered 2020-01-23 – 2020-01-24 (×2): 200 mg via ORAL
  Filled 2020-01-23 (×2): qty 1

## 2020-01-23 MED ORDER — LINACLOTIDE 145 MCG PO CAPS
290.0000 ug | ORAL_CAPSULE | Freq: Every day | ORAL | Status: DC
  Administered 2020-01-24: 290 ug via ORAL
  Filled 2020-01-23: qty 2

## 2020-01-23 MED ORDER — METHYLPREDNISOLONE ACETATE 40 MG/ML IJ SUSP
INTRAMUSCULAR | Status: DC | PRN
  Administered 2020-01-23: 80 mg

## 2020-01-23 SURGICAL SUPPLY — 62 items
ADH SKN CLS APL DERMABOND .7 (GAUZE/BANDAGES/DRESSINGS) ×1
ATTUNE MED ANAT PAT 35 KNEE (Knees) ×1 IMPLANT
ATTUNE PSFEM RTSZ5 NARCEM KNEE (Femur) ×1 IMPLANT
ATTUNE PSRP INSE SZ5 7 KNEE (Insert) ×1 IMPLANT
BAG SPEC THK2 15X12 ZIP CLS (MISCELLANEOUS)
BAG ZIPLOCK 12X15 (MISCELLANEOUS) IMPLANT
BASEPLATE TIBIAL ROTATING SZ 4 (Knees) ×1 IMPLANT
BLADE SAW SGTL 11.0X1.19X90.0M (BLADE) IMPLANT
BLADE SAW SGTL 13.0X1.19X90.0M (BLADE) ×2 IMPLANT
BLADE SURG SZ10 CARB STEEL (BLADE) ×4 IMPLANT
BNDG ELASTIC 6X5.8 VLCR STR LF (GAUZE/BANDAGES/DRESSINGS) ×2 IMPLANT
BOWL SMART MIX CTS (DISPOSABLE) ×2 IMPLANT
BSPLAT TIB 4 CMNT ROT PLAT STR (Knees) ×1 IMPLANT
CEMENT HV SMART SET (Cement) ×2 IMPLANT
COVER SURGICAL LIGHT HANDLE (MISCELLANEOUS) ×2 IMPLANT
COVER WAND RF STERILE (DRAPES) IMPLANT
CUFF TOURN SGL QUICK 34 (TOURNIQUET CUFF) ×2
CUFF TRNQT CYL 34X4.125X (TOURNIQUET CUFF) ×1 IMPLANT
DECANTER SPIKE VIAL GLASS SM (MISCELLANEOUS) ×4 IMPLANT
DERMABOND ADVANCED (GAUZE/BANDAGES/DRESSINGS) ×1
DERMABOND ADVANCED .7 DNX12 (GAUZE/BANDAGES/DRESSINGS) ×1 IMPLANT
DRAPE U-SHAPE 47X51 STRL (DRAPES) ×2 IMPLANT
DRESSING AQUACEL AG SP 3.5X10 (GAUZE/BANDAGES/DRESSINGS) ×1 IMPLANT
DRSG AQUACEL AG SP 3.5X10 (GAUZE/BANDAGES/DRESSINGS) ×2
DURAPREP 26ML APPLICATOR (WOUND CARE) ×4 IMPLANT
ELECT REM PT RETURN 15FT ADLT (MISCELLANEOUS) ×2 IMPLANT
GLOVE BIO SURGEON STRL SZ 6 (GLOVE) ×2 IMPLANT
GLOVE BIOGEL PI IND STRL 6.5 (GLOVE) ×1 IMPLANT
GLOVE BIOGEL PI IND STRL 7.5 (GLOVE) ×1 IMPLANT
GLOVE BIOGEL PI IND STRL 8.5 (GLOVE) ×1 IMPLANT
GLOVE BIOGEL PI INDICATOR 6.5 (GLOVE) ×1
GLOVE BIOGEL PI INDICATOR 7.5 (GLOVE) ×1
GLOVE BIOGEL PI INDICATOR 8.5 (GLOVE) ×1
GLOVE ECLIPSE 8.0 STRL XLNG CF (GLOVE) ×2 IMPLANT
GLOVE ORTHO TXT STRL SZ7.5 (GLOVE) ×2 IMPLANT
GOWN STRL REUS W/ TWL LRG LVL3 (GOWN DISPOSABLE) ×1 IMPLANT
GOWN STRL REUS W/TWL 2XL LVL3 (GOWN DISPOSABLE) ×2 IMPLANT
GOWN STRL REUS W/TWL LRG LVL3 (GOWN DISPOSABLE) ×4 IMPLANT
HANDPIECE INTERPULSE COAX TIP (DISPOSABLE) ×2
HOLDER FOLEY CATH W/STRAP (MISCELLANEOUS) IMPLANT
KIT TURNOVER KIT A (KITS) IMPLANT
MANIFOLD NEPTUNE II (INSTRUMENTS) ×2 IMPLANT
NDL SAFETY ECLIPSE 18X1.5 (NEEDLE) IMPLANT
NEEDLE HYPO 18GX1.5 SHARP (NEEDLE)
NS IRRIG 1000ML POUR BTL (IV SOLUTION) ×2 IMPLANT
PACK TOTAL KNEE CUSTOM (KITS) ×2 IMPLANT
PENCIL SMOKE EVACUATOR (MISCELLANEOUS) IMPLANT
PIN DRILL FIX HALF THREAD (BIT) ×1 IMPLANT
PIN FIX SIGMA LCS THRD HI (PIN) ×1 IMPLANT
PROTECTOR NERVE ULNAR (MISCELLANEOUS) ×2 IMPLANT
SET HNDPC FAN SPRY TIP SCT (DISPOSABLE) ×1 IMPLANT
SET PAD KNEE POSITIONER (MISCELLANEOUS) ×2 IMPLANT
SUT MNCRL AB 4-0 PS2 18 (SUTURE) ×2 IMPLANT
SUT STRATAFIX PDS+ 0 24IN (SUTURE) ×2 IMPLANT
SUT VIC AB 1 CT1 36 (SUTURE) ×2 IMPLANT
SUT VIC AB 2-0 CT1 27 (SUTURE) ×6
SUT VIC AB 2-0 CT1 TAPERPNT 27 (SUTURE) ×3 IMPLANT
SYR 3ML LL SCALE MARK (SYRINGE) ×2 IMPLANT
TRAY FOLEY MTR SLVR 16FR STAT (SET/KITS/TRAYS/PACK) ×2 IMPLANT
WATER STERILE IRR 1000ML POUR (IV SOLUTION) ×4 IMPLANT
WRAP KNEE MAXI GEL POST OP (GAUZE/BANDAGES/DRESSINGS) ×2 IMPLANT
YANKAUER SUCT BULB TIP 10FT TU (MISCELLANEOUS) ×2 IMPLANT

## 2020-01-23 NOTE — Interval H&P Note (Signed)
History and Physical Interval Note:  01/23/2020 10:07 AM  [name]  has presented today for surgery, with the diagnosis of Right knee osteoarthritis.  The various methods of treatment have been discussed with the patient and family. After consideration of risks, benefits and other options for treatment, the patient has consented to  Procedure(s) with comments: TOTAL KNEE ARTHROPLASTY, BILATERAL TROCHANTERIC INJECTION (Right) - 70 mins for Bilateral Troch injection 1 cc Depo and 2 CC Lidocaine as a surgical intervention.  The patient's history has been reviewed, patient examined, no change in status, stable for surgery.  I have reviewed the patient's chart and labs.  Questions were answered to the patient's satisfaction.     Mauri Pole

## 2020-01-23 NOTE — Anesthesia Postprocedure Evaluation (Signed)
Anesthesia Post Note  Patient: IYAHNA OBRIANT  Procedure(s) Performed: TOTAL KNEE ARTHROPLASTY, BILATERAL TROCHANTERIC INJECTION (Right Knee)     Patient location during evaluation: PACU Anesthesia Type: Regional and Spinal Level of consciousness: awake and alert Pain management: pain level controlled Vital Signs Assessment: post-procedure vital signs reviewed and stable Respiratory status: spontaneous breathing and respiratory function stable Cardiovascular status: blood pressure returned to baseline and stable Postop Assessment: spinal receding Anesthetic complications: no    Last Vitals:  Vitals:   01/23/20 1430 01/23/20 1445  BP: 115/76 115/76  Pulse: 63 63  Resp: 14 14  Temp:  36.4 C  SpO2: 99% 97%    Last Pain:  Vitals:   01/23/20 1445  TempSrc:   PainSc: 0-No pain    LLE Motor Response: Purposeful movement (01/23/20 1445) LLE Sensation: Decreased (01/23/20 1445) RLE Motor Response: Purposeful movement (01/23/20 1445) RLE Sensation: Decreased (01/23/20 1445) L Sensory Level: L4-Anterior knee, lower leg (01/23/20 1445) R Sensory Level: L4-Anterior knee, lower leg (01/23/20 1445)  Tuck Dulworth DANIEL

## 2020-01-23 NOTE — Op Note (Signed)
NAME:  [name]                      MEDICAL RECORD NO.:  [id number]                             FACILITY:  [facility]      PHYSICIAN:  Pietro Cassis. Alvan Dame, M.D.  DATE OF BIRTH:  Aug 14, 1976      DATE OF PROCEDURE:  01/23/2020                                     OPERATIVE REPORT         PREOPERATIVE DIAGNOSIS: 1.  Right knee osteoarthritis. 2. Bilateral trochanteric bursitis     POSTOPERATIVE DIAGNOSIS: 1.  Right knee osteoarthritis. 2. Bilateral trochanteric bursitis     FINDINGS:  The patient was noted to have complete loss of cartilage and   bone-on-bone arthritis with associated osteophytes in the lateral and patellofemoral compartments of   the knee with a significant synovitis and associated effusion.  The patient had failed months of conservative treatment including medications, injection therapy, activity modification.     PROCEDURE: 1. Right total knee replacement. 2. Bilateral trochanteric cortisone injections (40 mg of Depomedrol plus Lidocaine in each hip)     COMPONENTS USED:  DePuy Attune rotating platform posterior stabilized knee   system, a size 5N femur, 4 tibia, size 7 mm PS AOX insert, and 35 anatomic patellar   button.      SURGEON:  Pietro Cassis. Alvan Dame, M.D.      ASSISTANT:  Griffith Citron, PA-C.      ANESTHESIA:  Regional and Spinal.      SPECIMENS:  None.      COMPLICATION:  None.      DRAINS:  None.  EBL: <100cc      TOURNIQUET TIME:  31 min at 250 mmHg     The patient was stable to the recovery room.      INDICATION FOR PROCEDURE:  [name] is a 44 y.o. female patient of   mine.  The patient had been seen, evaluated, and treated for months conservatively in the   office with medication, activity modification, and injections.  The patient had   radiographic changes of bone-on-bone arthritis with endplate sclerosis and osteophytes noted.  Based on the radiographic changes and failed conservative measures, the patient   decided to proceed with  definitive treatment, total knee replacement.  Risks of infection, DVT, component failure, need for revision surgery, neurovascular injury were reviewed in the office setting.  The postop course was reviewed stressing the efforts to maximize post-operative satisfaction and function.  Consent was obtained for benefit of pain   relief.      PROCEDURE IN DETAIL:  The patient was brought to the operative theater.   Once adequate anesthesia, preoperative antibiotics, 1 gm of Vancomycin and weight dosed Gentamycin,1 gm of Tranexamic Acid, and 10 mg of Decadron administered, the patient was positioned supine with a right thigh tourniquet placed.  The  right lower extremity was prepped and draped in sterile fashion.  A time-   out was performed identifying the patient, planned procedure, and the appropriate extremity.      The right lower extremity was placed in the [facility] leg holder.  The leg was   exsanguinated, tourniquet elevated  to 250 mmHg.  A midline incision was   made followed by median parapatellar arthrotomy.  Following initial   exposure, attention was first directed to the patella.  Precut   measurement was noted to be 22 mm.  I resected down to 13 mm and used a   35 anatomic patellar button to restore patellar height as well as cover the cut surface.      The lug holes were drilled and a metal shim was placed to protect the   patella from retractors and saw blade during the procedure.      At this point, attention was now directed to the femur.  The femoral   canal was opened with a drill, irrigated to try to prevent fat emboli.  An   intramedullary rod was passed at 3 degrees valgus, 8 mm of bone was   resected off the distal femur due to pre-op passive hyperextension.  Following this resection, the tibia was   subluxated anteriorly.  Using the extramedullary guide, 2 mm of bone was resected off   the proximal medial tibia.  We confirmed the gap would be   stable medially and laterally  with a size 5 spacer block as well as confirmed that the tibial cut was perpendicular in the coronal plane, checking with an alignment rod.      Once this was done, I sized the femur to be a size 5 in the anterior-   posterior dimension, chose a narrow component based on medial and   lateral dimension.  The size 5 rotation block was then pinned in   position anterior referenced using the C-clamp to set rotation.  The   anterior, posterior, and  chamfer cuts were made without difficulty nor   notching making certain that I was along the anterior cortex to help   with flexion gap stability.      The final box cut was made off the lateral aspect of distal femur.      At this point, the tibia was sized to be a size 4.  The size 4 tray was   then pinned in position through the medial third of the tubercle,   drilled, and keel punched.  Trial reduction was now carried with a 5 femur,  4 tibia, a size 7 mm PS insert, and the 35 anatomic patella botton.  The knee was brought to full extension with good flexion stability with the patella   tracking through the trochlea without application of pressure.  Given   all these findings the trial components removed.  Final components were   opened and cement was mixed.  The knee was irrigated with normal saline solution and pulse lavage.  The synovial lining was   then injected with 30 cc of 0.25% Marcaine with epinephrine, 1 cc of Toradol and 30 cc of NS for a total of 61 cc.     Final implants were then cemented onto cleaned and dried cut surfaces of bone with the knee brought to extension with a size 7 mm PS trial insert.      Once the cement had fully cured, excess cement was removed   throughout the knee.  I confirmed that I was satisfied with the range of   motion and stability, and the final size 7 mm PS AOX insert was chosen.  It was   placed into the knee.      The tourniquet had been let down at 31 minutes.  No significant  hemostasis was  required.  The extensor mechanism was then reapproximated using #1 Vicryl and #1 Stratafix sutures with the knee   in flexion.  The   remaining wound was closed with 2-0 Vicryl and running 4-0 Monocryl.   The knee was cleaned, dried, dressed sterilely using Dermabond and   Aquacel dressing.    After the right knee was dressed and before she was awoken from anesthesia her right and left hips with cleaned and then injected with 40 mg of Depomedrol and Lidocaine.  Each was dressed with a  Bandaid   The patient was then   brought to recovery room in stable condition, tolerating the procedure   well.   Please note that Physician Assistant, Griffith Citron, PA-C was present for the entirety of the case, and was utilized for pre-operative positioning, peri-operative retractor management, general facilitation of the procedure and for primary wound closure at the end of the case.              Pietro Cassis Alvan Dame, M.D.    01/23/2020 11:45 AM

## 2020-01-23 NOTE — Plan of Care (Signed)
Plan of care 

## 2020-01-23 NOTE — Evaluation (Signed)
Physical Therapy Evaluation Patient Details Name: [name] MRN: [id number] DOB: [birth year] Today's Date: 01/23/2020   History of Present Illness  Patient is 44 y.o. female s/p Rt TKA on 01/23/20 with PMH significant for OA, remor, vertigo, peripheral neuropathy, obesitiy, OA, migarine, depression, GERD, colitis, asthma, anxiety, Lt TKA in 2019, gastric by pass, hx of feeding tube.    Clinical Impression  [name] is a 44 y.o. female POD 0 s/p Rt TKA. Patient reports independence with mobility at baseline with occasional use of SPC. Patient is now limited by functional impairments (see PT problem list below) and requires min assist for bed mobility and transfers with RW. Patient was able to take several short steps from EOB with RW and min assist but was limited by pain for further gait.. Patient instructed in exercise to facilitate ROM and circulation. Patient will benefit from continued skilled PT interventions to address impairments and progress towards PLOF. Acute PT will follow to progress mobility and stair training in preparation for safe discharge home.     Follow Up Recommendations Follow surgeon's recommendation for DC plan and follow-up therapies    Equipment Recommendations  None recommended by PT    Recommendations for Other Services       Precautions / Restrictions Precautions Precautions: Fall Restrictions Weight Bearing Restrictions: No Other Position/Activity Restrictions: WBAT      Mobility  Bed Mobility Overal bed mobility: Needs Assistance Bed Mobility: Supine to Sit;Sit to Supine     Supine to sit: Min assist;HOB elevated Sit to supine: Min assist;HOB elevated   General bed mobility comments: cues for use of bed rail, assist to mobilize Rt LE, pt instructed in use of gait belt to assist with raising LE into bed  Transfers Overall transfer level: Needs assistance Equipment used: Rolling walker (2 wheeled) Transfers: Sit to/from Stand Sit  to Stand: Min assist;From elevated surface         General transfer comment: cues for hand placement/technique with RW, assist required for power up and to steady with rising. Pt required cues to obtain upright posture.   Ambulation/Gait Ambulation/Gait assistance: Min assist Gait Distance (Feet): 3 Feet Assistive device: Rolling walker (2 wheeled) Gait Pattern/deviations: Step-to pattern;Decreased weight shift to right;Decreased stride length;Decreased step length - left;Decreased step length - right;Antalgic Gait velocity: decreased   General Gait Details: cues for step pattern with RW and manual facilitation to block Rt knee to prevent buckling. pt able to unweight Rt LE enough to prevent buckling. pt took 3 small short steps forward from EOB and backward to return to bed.  Stairs       Wheelchair Mobility    Modified Rankin (Stroke Patients Only)       Balance      Pertinent Vitals/Pain Pain Assessment: 0-10 Pain Score: 9  Pain Location: Rt knee Pain Descriptors / Indicators: Aching;Discomfort;Guarding Pain Intervention(s): Limited activity within patient's tolerance;Premedicated before session;Repositioned;Ice applied    Home Living Family/patient expects to be discharged to:: Private residence Living Arrangements: Children Available Help at Discharge: Family Type of Home: Apartment Home Access: Stairs to enter Entrance Stairs-Rails: None Entrance Stairs-Number of Steps: 1 curb and 1 at door Home Layout: One level Home Equipment: Environmental consultant - 2 wheels;Cane - single point;Bedside commode Additional Comments: borrowed rollator from mother    Prior Function Level of Independence: Independent         Comments: pt using SPC occasionally ; sometimes used scooter in food store due to pain.  Hand Dominance   Dominant Hand: Right    Extremity/Trunk Assessment   Upper Extremity Assessment Upper Extremity Assessment: Overall WFL for tasks assessed     Lower Extremity Assessment Lower Extremity Assessment: RLE deficits/detail RLE Deficits / Details: pt limited by pain poor activation of quad RLE: Unable to fully assess due to pain LLE Sensation: WNL LLE Coordination: WNL    Cervical / Trunk Assessment Cervical / Trunk Assessment: Normal  Communication   Communication: No difficulties  Cognition Arousal/Alertness: Awake/alert Behavior During Therapy: WFL for tasks assessed/performed Overall Cognitive Status: Within Functional Limits for tasks assessed                   General Comments      Exercises Total Joint Exercises Ankle Circles/Pumps: AROM;Both;15 reps;Supine Quad Sets: AROM;5 reps;Supine;Right   Assessment/Plan    PT Assessment Patient needs continued PT services  PT Problem List Decreased strength;Decreased range of motion;Decreased activity tolerance;Decreased balance;Decreased mobility;Decreased knowledge of use of DME;Decreased knowledge of precautions       PT Treatment Interventions DME instruction;Gait training;Stair training;Functional mobility training;Therapeutic activities;Therapeutic exercise;Balance training;Patient/family education    PT Goals (Current goals can be found in the Care Plan section)  Acute Rehab PT Goals Patient Stated Goal: get home and recovered PT Goal Formulation: With patient Time For Goal Achievement: 01/30/20 Potential to Achieve Goals: Good    Frequency 7X/week    AM-PAC PT "6 Clicks" Mobility  Outcome Measure Help needed turning from your back to your side while in a flat bed without using bedrails?: A Little Help needed moving from lying on your back to sitting on the side of a flat bed without using bedrails?: A Little Help needed moving to and from a bed to a chair (including a wheelchair)?: A Little Help needed standing up from a chair using your arms (e.g., wheelchair or bedside chair)?: A Little Help needed to walk in hospital room?: A Little Help needed  climbing 3-5 steps with a railing? : A Lot 6 Click Score: 17    End of Session Equipment Utilized During Treatment: Gait belt Activity Tolerance: Patient tolerated treatment well Patient left: in bed;with call bell/phone within reach;with SCD's reapplied Nurse Communication: Mobility status PT Visit Diagnosis: Muscle weakness (generalized) (M62.81);Difficulty in walking, not elsewhere classified (R26.2)    Time: 1721-1750 PT Time Calculation (min) (ACUTE ONLY): 29 min   Charges:   PT Evaluation $PT Eval Low Complexity: 1 Low PT Treatments $Therapeutic Exercise: 8-22 mins        Verner Mould, DPT Physical Therapist with [facility] [phone]  01/23/2020 7:07 PM

## 2020-01-23 NOTE — Transfer of Care (Signed)
Immediate Anesthesia Transfer of Care Note  Patient: [name]  Procedure(s) Performed: Procedure(s) with comments: TOTAL KNEE ARTHROPLASTY, BILATERAL TROCHANTERIC INJECTION (Right) - 70 mins for Bilateral Troch injection 1 cc Depo and 2 CC Lidocaine  Patient Location: PACU  Anesthesia Type:Spinal  Level of Consciousness: Alert, Awake, Oriented  Airway & Oxygen Therapy: Patient Spontanous Breathing  Post-op Assessment: Report given to RN  Post vital signs: Reviewed and stable  Last Vitals:  Vitals:   01/23/20 1029 01/23/20 1030  BP:    Pulse: 71 71  Resp: (!) 9 11  Temp:    SpO2: 097% 353%    Complications: No apparent anesthesia complications

## 2020-01-23 NOTE — Progress Notes (Signed)
AssistedDr. Criss Rosales with right, ultrasound guided, adductor canal block. Side rails up, monitors on throughout procedure. See vital signs in flow sheet. Tolerated Procedure well.

## 2020-01-23 NOTE — Anesthesia Procedure Notes (Signed)
Anesthesia Regional Block: Adductor canal block   Pre-Anesthetic Checklist: ,, timeout performed, Correct Patient, Correct Site, Correct Laterality, Correct Procedure, Correct Position, site marked, Risks and benefits discussed,  Surgical consent,  Pre-op evaluation,  At surgeon's request and post-op pain management  Laterality: Right  Prep: Maximum Sterile Barrier Precautions used, chloraprep       Needles:  Injection technique: Single-shot  Needle Type: Echogenic Stimulator Needle     Needle Length: 9cm  Needle Gauge: 22     Additional Needles:   Procedures:,,,, ultrasound used (permanent image in chart),,,,  Narrative:  Start time: 01/23/2020 10:20 AM End time: 01/23/2020 10:25 AM Injection made incrementally with aspirations every 5 mL.  Performed by: Personally  Anesthesiologist: Pervis Hocking, DO  Additional Notes: Monitors applied. No increased pain on injection. No increased resistance to injection. Injection made in 5cc increments. Good needle visualization. Patient tolerated procedure well.

## 2020-01-23 NOTE — Anesthesia Procedure Notes (Signed)
Spinal  Patient location during procedure: OR Start time: 01/23/2020 11:38 AM End time: 01/23/2020 11:43 AM Staffing Performed: anesthesiologist  Anesthesiologist: Pervis Hocking, DO Preanesthetic Checklist Completed: patient identified, IV checked, site marked, risks and benefits discussed, surgical consent, monitors and equipment checked, pre-op evaluation and timeout performed Spinal Block Patient position: sitting Prep: DuraPrep and site prepped and draped Patient monitoring: heart rate, cardiac monitor, continuous pulse ox and blood pressure Approach: midline Location: L3-4 Injection technique: single-shot Needle Needle type: Sprotte  Needle gauge: 24 G Needle length: 9 cm Assessment Sensory level: T4 Additional Notes Functioning IV was confirmed and monitors were applied. Sterile prep and drape, including hand hygiene and sterile gloves were used. The patient was positioned and the spine was prepped. The skin was anesthetized with lidocaine.  Free flow of clear CSF was obtained prior to injecting local anesthetic into the CSF.  The spinal needle aspirated freely following injection.  The needle was carefully withdrawn.  The patient tolerated the procedure well.

## 2020-01-23 NOTE — Discharge Instructions (Signed)

## 2020-01-24 ENCOUNTER — Encounter: Payer: Self-pay | Admitting: *Deleted

## 2020-01-24 DIAGNOSIS — M1711 Unilateral primary osteoarthritis, right knee: Secondary | ICD-10-CM | POA: Diagnosis not present

## 2020-01-24 LAB — BASIC METABOLIC PANEL
Anion gap: 8 (ref 5–15)
BUN: 15 mg/dL (ref 6–20)
CO2: 22 mmol/L (ref 22–32)
Calcium: 8.8 mg/dL — ABNORMAL LOW (ref 8.9–10.3)
Chloride: 108 mmol/L (ref 98–111)
Creatinine, Ser: 0.79 mg/dL (ref 0.44–1.00)
GFR calc Af Amer: >60 mL/min (ref >60)
GFR calc non Af Amer: >60 mL/min (ref >60)
Glucose, Bld: 169 mg/dL — ABNORMAL HIGH (ref 70–99)
Potassium: 4.6 mmol/L (ref 3.5–5.1)
Sodium: 138 mmol/L (ref 135–145)

## 2020-01-24 LAB — CBC
HCT: 32.6 % — ABNORMAL LOW (ref 36.0–46.0)
Hemoglobin: 10.5 g/dL — ABNORMAL LOW (ref 12.0–15.0)
MCH: 29.9 pg (ref 26.0–34.0)
MCHC: 32.2 g/dL (ref 30.0–36.0)
MCV: 92.9 fL (ref 80.0–100.0)
Platelets: 219 10*3/uL (ref 150–400)
RBC: 3.51 MIL/uL — ABNORMAL LOW (ref 3.87–5.11)
RDW: 12.4 % (ref 11.5–15.5)
WBC: 17.5 10*3/uL — ABNORMAL HIGH (ref 4.0–10.5)
nRBC: 0 % (ref 0.0–0.2)

## 2020-01-24 MED ORDER — ACETAMINOPHEN 500 MG PO TABS: 1000.0000 mg | ORAL_TABLET | Freq: Four times a day (QID) | ORAL | 0 refills | Status: DC | PRN

## 2020-01-24 MED ORDER — ASPIRIN 81 MG PO CHEW: 81.0000 mg | CHEWABLE_TABLET | Freq: Two times a day (BID) | ORAL | 0 refills | Status: AC

## 2020-01-24 MED ORDER — FERROUS SULFATE 325 (65 FE) MG PO TABS: 325.0000 mg | ORAL_TABLET | Freq: Two times a day (BID) | ORAL | 3 refills | Status: DC

## 2020-01-24 MED ORDER — HYDROMORPHONE HCL 4 MG PO TABS: 4.0000 mg | ORAL_TABLET | ORAL | 0 refills | Status: DC | PRN

## 2020-01-24 MED ORDER — HYDROMORPHONE HCL 2 MG PO TABS
4.0000 mg | ORAL_TABLET | ORAL | Status: DC | PRN
  Administered 2020-01-24 (×2): 6 mg via ORAL
  Filled 2020-01-24 (×2): qty 3

## 2020-01-24 MED ORDER — CELECOXIB 200 MG PO CAPS: 200.0000 mg | ORAL_CAPSULE | Freq: Two times a day (BID) | ORAL | 0 refills | Status: DC

## 2020-01-24 NOTE — Progress Notes (Signed)
Physical Therapy Treatment Patient Details Name: [name] MRN: [id number] DOB: 10-02-1975 Today's Date: 01/24/2020    History of Present Illness Patient is 44 y.o. female s/p Rt TKA on 01/23/20 with PMH significant for OA, remor, vertigo, peripheral neuropathy, obesitiy, OA, migarine, depression, GERD, colitis, asthma, anxiety, Lt TKA in 2019, gastric by pass, hx of feeding tube.    PT Comments    Pt assisted with ambulating in hallway and performed LE exercises.  Pt provided with HEP handout.  Pt had no further questions and ready for d/c home today.    Follow Up Recommendations  Follow surgeon's recommendation for DC plan and follow-up therapies     Equipment Recommendations  None recommended by PT    Recommendations for Other Services       Precautions / Restrictions Precautions Precautions: Fall;Knee Restrictions Other Position/Activity Restrictions: WBAT    Mobility  Bed Mobility Overal bed mobility: Needs Assistance Bed Mobility: Supine to Sit     Supine to sit: Supervision;HOB elevated        Transfers Overall transfer level: Needs assistance Equipment used: Rolling walker (2 wheeled) Transfers: Sit to/from Stand Sit to Stand: Min guard;Supervision         General transfer comment: verbal cues for UE and LE positioning  Ambulation/Gait Ambulation/Gait assistance: Min guard Gait Distance (Feet): 160 Feet Assistive device: Rolling walker (2 wheeled) Gait Pattern/deviations: Step-to pattern;Decreased stance time - right;Antalgic Gait velocity: decreased   General Gait Details: verbal cues for step length, RW positioning   Stairs             Wheelchair Mobility    Modified Rankin (Stroke Patients Only)       Balance                                            Cognition Arousal/Alertness: Awake/alert Behavior During Therapy: WFL for tasks assessed/performed Overall Cognitive Status: Within Functional Limits  for tasks assessed                                        Exercises Total Joint Exercises Ankle Circles/Pumps: AROM;Both;15 reps Quad Sets: AROM;Both;10 reps Short Arc QuadSinclair Lutz;Right;10 reps Heel Slides: Right;10 reps;AAROM Hip ABduction/ADduction: AAROM;Right;10 reps Straight Leg Raises: AAROM;Right;10 reps Knee Flexion: AAROM;Right;Seated;10 reps    General Comments        Pertinent Vitals/Pain Pain Assessment: 0-10 Pain Score: 5  Pain Location: Rt knee Pain Descriptors / Indicators: Aching;Discomfort;Guarding Pain Intervention(s): Premedicated before session;Repositioned;Monitored during session    Home Living                      Prior Function            PT Goals (current goals can now be found in the care plan section) Progress towards PT goals: Progressing toward goals    Frequency    7X/week      PT Plan Current plan remains appropriate    Co-evaluation              AM-PAC PT "6 Clicks" Mobility   Outcome Measure  Help needed turning from your back to your side while in a flat bed without using bedrails?: A Little Help needed moving from lying on your back to sitting on  the side of a flat bed without using bedrails?: A Little Help needed moving to and from a bed to a chair (including a wheelchair)?: A Little Help needed standing up from a chair using your arms (e.g., wheelchair or bedside chair)?: A Little Help needed to walk in hospital room?: A Little Help needed climbing 3-5 steps with a railing? : A Little 6 Click Score: 18    End of Session Equipment Utilized During Treatment: Gait belt Activity Tolerance: Patient tolerated treatment well Patient left: in chair;with call bell/phone within reach Nurse Communication: Mobility status PT Visit Diagnosis: Muscle weakness (generalized) (M62.81);Difficulty in walking, not elsewhere classified (R26.2)     Time: 8115-7262 PT Time Calculation (min) (ACUTE ONLY):  26 min  Charges:  $Gait Training: 8-22 mins $Therapeutic Exercise: 8-22 mins                    [name], DPT Acute Rehabilitation Services Office: [phone]   York Ram E 01/24/2020, 1:08 PM

## 2020-01-24 NOTE — TOC Transition Note (Signed)
Transition of Care [facility]) - CM/SW Discharge Note   Patient Details  Name: [name] MRN: [id number] Date of Birth: 1976/03/31  [facility]) CM/SW Contact:  Lennart Pall, LCSW Phone Number: 01/24/2020, 9:59 AM   Clinical Narrative:   Met briefly with pt to confirm DME and OPPT referrals already in place via MD office.  No further needs.    Final next level of care: OP Rehab Barriers to Discharge: No Barriers Identified   Patient Goals and CMS Choice Patient states their goals for this hospitalization and ongoing recovery are:: go home today!      Discharge Placement                       Discharge Plan and Services                DME Arranged: 3-N-1 DME Agency: [employer] Date DME Agency Contacted: 01/24/20 Time DME Agency Contacted: [phone] Representative spoke with at DME Agency: Elna Breslow Arranged: NA(Pt set up for OPPT at [facility])          Social Determinants of Health ([place]) Interventions     Readmission Risk Interventions No flowsheet data found.

## 2020-01-24 NOTE — Progress Notes (Addendum)
Subjective: 1 Day Post-Op Procedure(s) (LRB): TOTAL KNEE ARTHROPLASTY, BILATERAL TROCHANTERIC INJECTION (Right) Patient reports pain as moderate.   Patient seen in rounds with Dr. Alvan Dame. Patient is well, and has had no acute complaints or problems other than pain in the right knee. No acute events overnight. Foley catheter removed, positive flatus. Patient reports severe pain overnight, which was not controlled with current PO medication.  We will continue therapy today.   Objective: Vital signs in last 24 hours: Temp:  [96.6 F (35.9 C)-98.4 F (36.9 C)] 98.1 F (36.7 C) (04/28 0647) Pulse Rate:  [63-87] 64 (04/28 0647) Resp:  [5-24] 15 (04/28 0647) BP: (110-127)/(64-93) 119/70 (04/28 0647) SpO2:  [97 %-100 %] 98 % (04/28 0647) Weight:  [80 kg-80.8 kg] 80 kg (04/27 1512)  Intake/Output from previous day:  Intake/Output Summary (Last 24 hours) at 01/24/2020 0807 Last data filed at 01/24/2020 0600 Gross per 24 hour  Intake 2892.95 ml  Output 2645 ml  Net 247.95 ml     Intake/Output this shift: No intake/output data recorded.  Labs: Recent Labs    01/24/20 0253  HGB 10.5*   Recent Labs    01/24/20 0253  WBC 17.5*  RBC 3.51*  HCT 32.6*  PLT 219   Recent Labs    01/24/20 0253  NA 138  K 4.6  CL 108  CO2 22  BUN 15  CREATININE 0.79  GLUCOSE 169*  CALCIUM 8.8*   No results for input(s): LABPT, INR in the last 72 hours.  Exam: General - Patient is Alert and Oriented Extremity - Neurologically intact Sensation intact distally Intact pulses distally Dorsiflexion/Plantar flexion intact Dressing - dressing C/D/I Motor Function - intact, moving foot and toes well on exam.   Past Medical History:  Diagnosis Date  . Anal fissure - posterior 10/16/2014    OCC  ISSUES  . Anxiety    doesn't take anything  . Asthma    has Albuterol inhaler as needed  . Boil    on pubic area started septra 03-01-17 draining blood and pus  . Bronchitis   . Chronic headache  disorder 07/22/2016  . Clostridium difficile infection 04/20/2012  . Colitis   . Dehydration   . Depression   . Family history of adverse reaction to anesthesia    pt mom gets sick  . GERD (gastroesophageal reflux disease)    takes Pantoprazole daily  . Headache   . Hiatal hernia    neuropathy - mild in arms and legs  . History of blood transfusion    last transfusion was 04/04/2016=Benadryl was given d/t itching. States she always itches with transfusion.   Marland Kitchen History of bronchitis    > 2 yrs ago  . History of colon polyps    benign  . History of migraine    last one 05/01/16  . History of stomach ulcers   . History of urinary tract infection LAST 2 WEEKS AGO  . IDA (iron deficiency anemia)   . Internal and external bleeding hemorrhoids 06/11/2014  . Joint pain   . Joint swelling   . Left sided chronic colitis - segmental 06/11/2014  . Marginal ulcer 10/25/2017  . Migraine   . Motion sickness   . Nausea    takes Zofran as needed  . Nausea and vomiting    for 1 year  . Obesity   . Oligouria   . Osteoarthritis    lower back, knees, wrists - no meds  . Peripheral neuropathy 03/01/2019  .  Pneumonia 1997  . Postoperative nausea and vomiting 01/21/2016   wants scopolamine patch  . Right carpal tunnel syndrome 03/01/2019  . SVD (spontaneous vaginal delivery)    x 4  . Tingling    BOTH LOWER EXTREMETIES ALL THE TIME DUE TO RAPID WEIGHT LOSS  . TOBACCO USER 10/02/2009   Qualifier: Diagnosis of  By: Dimas Millin MD, Ellard Artis    . Transfusion history    last transfusion 6'16   . Tremor 08/31/2018  . UC (ulcerative colitis) ([place])    supposed to be taking Lialda and Bentyl but has been off since gastric sleeve  . Vertigo    doesn't take any meds    Assessment/Plan: 1 Day Post-Op Procedure(s) (LRB): TOTAL KNEE ARTHROPLASTY, BILATERAL TROCHANTERIC INJECTION (Right) Active Problems:   S/P right TKA   Status post total knee replacement, right  Estimated body mass index is 27.62 kg/m as  calculated from the following:   Height as of this encounter: 5' 7"  (1.702 m).   Weight as of this encounter: 80 kg. Advance diet Up with therapy D/C IV fluids   Patient's anticipated LOS is less than 2 midnights, meeting these requirements: - Younger than 74 - Lives within 1 hour of care - Has a competent adult at home to recover with post-op recover - NO history of  - Chronic pain requiring opiods  - Diabetes  - Coronary Artery Disease  - Heart failure  - Heart attack  - Stroke  - DVT/VTE  - Cardiac arrhythmia  - Respiratory Failure/COPD  - Renal failure  - Anemia  - Advanced Liver disease    DVT Prophylaxis - Aspirin Weight bearing as tolerated.  Plan is to go Home after hospital stay. Plan for discharge today following 1-2 sessions of therapy as long as she is meeting her goals.Scheduled for OPPT. Dr. Alvan Dame discussed with her the difficulty of managing her acute pain due to long term treatment for chronic pain. We will adjust her pain medication to allow for 4-8 mg dilaudid q4h, as she is on 85m q3h at baseline. Per PDMP, she did fill #240 tabs of dilaudid 4 mg on 01/12/20, and we will hold off on sending additional medication at this point. When she runs out of pain medication, she should contact our office for a refill. She will follow up in the office in 2 weeks.   AGriffith Citron PA-C Orthopedic Surgery ([phone]/28/2021, 8:07 AM

## 2020-01-24 NOTE — Plan of Care (Signed)
Plan of care 

## 2020-01-28 NOTE — Discharge Summary (Signed)
Physician Discharge Summary   Patient ID: [name] MRN: [id number] DOB/AGE: 1976/01/27 44 y.o.  Admit date: 01/23/2020 Discharge date: 01/24/2020  Primary Diagnosis:  1.  Right knee osteoarthritis. 2. Bilateral trochanteric bursitis   Admission Diagnoses:  Past Medical History:  Diagnosis Date  . Anal fissure - posterior 10/16/2014    OCC  ISSUES  . Anxiety    doesn't take anything  . Asthma    has Albuterol inhaler as needed  . Boil    on pubic area started septra 03-01-17 draining blood and pus  . Bronchitis   . Chronic headache disorder 07/22/2016  . Clostridium difficile infection 04/20/2012  . Colitis   . Dehydration   . Depression   . Family history of adverse reaction to anesthesia    pt mom gets sick  . GERD (gastroesophageal reflux disease)    takes Pantoprazole daily  . Headache   . Hiatal hernia    neuropathy - mild in arms and legs  . History of blood transfusion    last transfusion was 04/04/2016=Benadryl was given d/t itching. States she always itches with transfusion.   Marland Kitchen History of bronchitis    > 2 yrs ago  . History of colon polyps    benign  . History of migraine    last one 05/01/16  . History of stomach ulcers   . History of urinary tract infection LAST 2 WEEKS AGO  . IDA (iron deficiency anemia)   . Internal and external bleeding hemorrhoids 06/11/2014  . Joint pain   . Joint swelling   . Left sided chronic colitis - segmental 06/11/2014  . Marginal ulcer 10/25/2017  . Migraine   . Motion sickness   . Nausea    takes Zofran as needed  . Nausea and vomiting    for 1 year  . Obesity   . Oligouria   . Osteoarthritis    lower back, knees, wrists - no meds  . Peripheral neuropathy 03/01/2019  . Pneumonia 1997  . Postoperative nausea and vomiting 01/21/2016   wants scopolamine patch  . Right carpal tunnel syndrome 03/01/2019  . SVD (spontaneous vaginal delivery)    x 4  . Tingling    BOTH LOWER EXTREMETIES ALL THE TIME DUE TO RAPID WEIGHT  LOSS  . TOBACCO USER 10/02/2009   Qualifier: Diagnosis of  By: Dimas Millin MD, Ellard Artis    . Transfusion history    last transfusion 6'16   . Tremor 08/31/2018  . UC (ulcerative colitis) ([place])    supposed to be taking Lialda and Bentyl but has been off since gastric sleeve  . Vertigo    doesn't take any meds   Discharge Diagnoses:   Active Problems:   S/P right TKA   Status post total knee replacement, right  Estimated body mass index is 27.62 kg/m as calculated from the following:   Height as of this encounter: 5' 7"  (1.702 m).   Weight as of this encounter: 80 kg.  Procedure:  Procedure(s) (LRB): TOTAL KNEE ARTHROPLASTY, BILATERAL TROCHANTERIC INJECTION (Right)   Consults: None  HPI: [name] is a 44 y.o. female patient of   mine.  The patient had been seen, evaluated, and treated for months conservatively in the   office with medication, activity modification, and injections.  The patient had   radiographic changes of bone-on-bone arthritis with endplate sclerosis and osteophytes noted.  Based on the radiographic changes and failed conservative measures, the patient   decided to proceed with  definitive treatment, total knee replacement.  Risks of infection, DVT, component failure, need for revision surgery, neurovascular injury were reviewed in the office setting.  The postop course was reviewed stressing the efforts to maximize post-operative satisfaction and function.  Consent was obtained for benefit of pain   relief.   Laboratory Data: Admission on 01/23/2020, Discharged on 01/24/2020  Component Date Value Ref Range Status  . ABO/RH(D) 01/23/2020 O POS   Final  . Antibody Screen 01/23/2020 NEG   Final  . Sample Expiration 01/23/2020    Final                   Value:01/26/2020,2359 Performed at [facility], [place] [address] [place], [place] [address]   . Preg Test, Ur 01/23/2020 NEGATIVE  NEGATIVE Final   Comment:        THE SENSITIVITY OF  THIS METHODOLOGY IS >20 mIU/mL. Performed at [facility], [place] [address] [place], [place] [address]   . WBC 01/24/2020 17.5* 4.0 - 10.5 K/uL Final  . RBC 01/24/2020 3.51* 3.87 - 5.11 MIL/uL Final  . Hemoglobin 01/24/2020 10.5* 12.0 - 15.0 g/dL Final  . HCT 01/24/2020 32.6* 36.0 - 46.0 % Final  . MCV 01/24/2020 92.9  80.0 - 100.0 fL Final  . MCH 01/24/2020 29.9  26.0 - 34.0 pg Final  . MCHC 01/24/2020 32.2  30.0 - 36.0 g/dL Final  . RDW 01/24/2020 12.4  11.5 - 15.5 % Final  . Platelets 01/24/2020 219  150 - 400 K/uL Final  . nRBC 01/24/2020 0.0  0.0 - 0.2 % Final   Performed at [facility], [place] [address], [place], [place] [address]  . Sodium 01/24/2020 138  135 - 145 mmol/L Final  . Potassium 01/24/2020 4.6  3.5 - 5.1 mmol/L Final  . Chloride 01/24/2020 108  98 - 111 mmol/L Final  . CO2 01/24/2020 22  22 - 32 mmol/L Final  . Glucose, Bld 01/24/2020 169* 70 - 99 mg/dL Final   Glucose reference range applies only to samples taken after fasting for at least 8 hours.  . BUN 01/24/2020 15  6 - 20 mg/dL Final  . Creatinine, Ser 01/24/2020 0.79  0.44 - 1.00 mg/dL Final  . Calcium 01/24/2020 8.8* 8.9 - 10.3 mg/dL Final  . GFR calc non Af Amer 01/24/2020 >60  >60 mL/min Final  . GFR calc Af Amer 01/24/2020 >60  >60 mL/min Final  . Anion gap 01/24/2020 8  5 - 15 Final   Performed at [facility], [place] [address] [place],  [address]  Hospital Outpatient Visit on 01/19/2020  Component Date Value Ref Range Status  . SARS Coronavirus 2 01/19/2020 NEGATIVE  NEGATIVE Final   Comment: (NOTE) SARS-CoV-2 target nucleic acids are NOT DETECTED. The SARS-CoV-2 RNA is generally detectable in upper and lower respiratory specimens during the acute phase of infection. Negative results do not preclude SARS-CoV-2 infection, do not rule out co-infections with other pathogens, and should not be used as the sole basis for treatment or other  patient management decisions. Negative results must be combined with clinical observations, patient history, and epidemiological information. The expected result is Negative. Fact Sheet for Patients: SugarRoll.be Fact Sheet for Healthcare Providers: https://www.woods-mathews.com/ This test is not yet approved or cleared by the [place] FDA and  has been authorized for detection and/or diagnosis of SARS-CoV-2 by FDA under an Emergency Use Authorization (EUA). This EUA will remain  in effect (meaning  this test can be used) for the duration of the COVID-19 declaration under Section 56                          4(b)(1) of the Act, 21 U.S.C. section 360bbb-3(b)(1), unless the authorization is terminated or revoked sooner. Performed at [facility], [place] [address] [place], [place] [address]   Hospital Outpatient Visit on 01/16/2020  Component Date Value Ref Range Status  . MRSA, PCR 01/16/2020 NEGATIVE  NEGATIVE Final  . Staphylococcus aureus 01/16/2020 NEGATIVE  NEGATIVE Final   Comment: (NOTE) The Xpert SA Assay (FDA approved for NASAL specimens in patients 61 years of age and older), is one component of a comprehensive surveillance program. It is not intended to diagnose infection nor to guide or monitor treatment. Performed at [facility], [place] [address] [place], [place] [address]   . WBC 01/16/2020 7.7  4.0 - 10.5 K/uL Final  . RBC 01/16/2020 3.84* 3.87 - 5.11 MIL/uL Final  . Hemoglobin 01/16/2020 11.3* 12.0 - 15.0 g/dL Final  . HCT 01/16/2020 37.1  36.0 - 46.0 % Final  . MCV 01/16/2020 96.6  80.0 - 100.0 fL Final  . MCH 01/16/2020 29.4  26.0 - 34.0 pg Final  . MCHC 01/16/2020 30.5  30.0 - 36.0 g/dL Final  . RDW 01/16/2020 12.4  11.5 - 15.5 % Final  . Platelets 01/16/2020 279  150 - 400 K/uL Final  . nRBC 01/16/2020 0.0  0.0 - 0.2 % Final   Performed at [facility], [place] [address] [place], [place] [address]  Office Visit on 01/05/2020  Component Date Value Ref Range Status  . Urine Culture, Routine 01/05/2020 Final report   Final  . Organism ID, Bacteria 01/05/2020 Comment   Final   Comment: Mixed urogenital flora Less than 10,000 colonies/mL   . Color, UA 01/05/2020 yellow  yellow Final  . Clarity, UA 01/05/2020 clear  clear Final  . Glucose, UA 01/05/2020 negative  negative mg/dL Final  . Bilirubin, UA 01/05/2020 negative  negative Final  . Ketones, POC UA 01/05/2020 negative  negative mg/dL Final  . Spec Grav, UA 01/05/2020 1.025  1.010 - 1.025 Final  . Blood, UA 01/05/2020 negative  negative Final  . pH, UA 01/05/2020 5.5  5.0 - 8.0 Final  . Protein Ur, POC 01/05/2020 negative  negative mg/dL Final  . Urobilinogen, UA 01/05/2020 0.2  0.2 or 1.0 E.U./dL Final  . Nitrite, UA 01/05/2020 Negative  Negative Final  . Leukocytes, UA 01/05/2020 Negative  Negative Final  . WBC,UR,HPF,POC 01/05/2020 None  None WBC/hpf Final  . RBC,UR,HPF,POC 01/05/2020 None  None RBC/hpf Final  . Bacteria 01/05/2020 Moderate* None, Too numerous to count Final  . Mucus 01/05/2020 Present* Absent Final  . Epithelial Cells, UR Per Microscopy 01/05/2020 Moderate* None, Too numerous to count cells/hpf Final  . Yeast by KOH 01/05/2020 Absent  Absent Final  . Yeast by wet prep 01/05/2020 Absent  Absent Final  . WBC by wet prep 01/05/2020 Few  Few Final  . Clue Cells Wet Prep HPF POC 01/05/2020 Moderate* None Final  . Trich by wet prep 01/05/2020 Absent  Absent Final  . Bacteria Wet Prep HPF POC 01/05/2020 Many* Few Final  . Epithelial Cells By Wet Pref ([facility]) 01/05/2020 Moderate* None, Few, Too numerous to count Final  . RBC,UR,HPF,POC 01/05/2020 None  None RBC/hpf Final     X-Rays:US BREAST LTD UNI RIGHT INC  AXILLA  Result Date: 01/18/2020 CLINICAL DATA:  44 year old female with focal thickening in the UPPER-OUTER RIGHT breast for several years. Also for annual bilateral mammogram.  EXAM: DIGITAL DIAGNOSTIC BILATERAL MAMMOGRAM WITH CAD AND TOMO ULTRASOUND RIGHT BREAST COMPARISON:  Previous exam(s). ACR Breast Density Category b: There are scattered areas of fibroglandular density. FINDINGS: 2D/3D full field views of both breasts and spot compression view of the RIGHT breast demonstrate no suspicious mass, distortion or worrisome calcifications. Mammographic images were processed with CAD. On physical exam, focal thickening is identified within the UPPER-OUTER RIGHT breast without discrete palpable mass. Targeted ultrasound is performed, showing normal fibroglandular tissue within the UPPER-OUTER RIGHT breast corresponding to the patient's palpable abnormality. Incidental note is made of a normal intraparenchymal lymph node. IMPRESSION: 1. Normal fibroglandular tissue within the UPPER-OUTER RIGHT breast corresponding to the patient's palpable abnormality. 2. No mammographic evidence of breast malignancy. RECOMMENDATION: Bilateral screening mammogram in 1 year. I have discussed the findings and recommendations with the patient. If applicable, a reminder letter will be sent to the patient regarding the next appointment. BI-RADS CATEGORY  1: Negative. Electronically Signed   By: Margarette Canada M.D.   On: 01/18/2020 10:03   MM DIAG BREAST TOMO BILATERAL  Result Date: 01/18/2020 CLINICAL DATA:  44 year old female with focal thickening in the UPPER-OUTER RIGHT breast for several years. Also for annual bilateral mammogram. EXAM: DIGITAL DIAGNOSTIC BILATERAL MAMMOGRAM WITH CAD AND TOMO ULTRASOUND RIGHT BREAST COMPARISON:  Previous exam(s). ACR Breast Density Category b: There are scattered areas of fibroglandular density. FINDINGS: 2D/3D full field views of both breasts and spot compression view of the RIGHT breast demonstrate no suspicious mass, distortion or worrisome calcifications. Mammographic images were processed with CAD. On physical exam, focal thickening is identified within the UPPER-OUTER  RIGHT breast without discrete palpable mass. Targeted ultrasound is performed, showing normal fibroglandular tissue within the UPPER-OUTER RIGHT breast corresponding to the patient's palpable abnormality. Incidental note is made of a normal intraparenchymal lymph node. IMPRESSION: 1. Normal fibroglandular tissue within the UPPER-OUTER RIGHT breast corresponding to the patient's palpable abnormality. 2. No mammographic evidence of breast malignancy. RECOMMENDATION: Bilateral screening mammogram in 1 year. I have discussed the findings and recommendations with the patient. If applicable, a reminder letter will be sent to the patient regarding the next appointment. BI-RADS CATEGORY  1: Negative. Electronically Signed   By: Margarette Canada M.D.   On: 01/18/2020 10:03    EKG: Orders placed or performed during the hospital encounter of 08/21/19  . EKG 12-Lead  . EKG 12-Lead  . ED EKG  . ED EKG  . EKG     Hospital Course: [name] is a 44 y.o. who was admitted to [facility]. They were brought to the operating room on 01/23/2020 and underwent Procedure(s): TOTAL KNEE ARTHROPLASTY, BILATERAL TROCHANTERIC INJECTION.  Patient tolerated the procedure well and was later transferred to the recovery room and then to the orthopaedic floor for postoperative care. They were given PO and IV analgesics for pain control following their surgery. They were given 24 hours of postoperative antibiotics of  Anti-infectives (From admission, onward)   Start     Dose/Rate Route Frequency Ordered Stop   01/23/20 2300  vancomycin (VANCOCIN) IVPB 1000 mg/200 mL premix     1,000 mg 200 mL/hr over 60 Minutes Intravenous  Once 01/23/20 1521 01/24/20 0000   01/23/20 0915  vancomycin (VANCOCIN) IVPB 1000 mg/200 mL premix     1,000 mg 200 mL/hr over 60 Minutes  Intravenous On call to O.R. 01/23/20 0914 01/23/20 1216   01/23/20 0600  gentamicin (GARAMYCIN) 350 mg in dextrose 5 % 100 mL IVPB     5 mg/kg  69.3 kg  (Adjusted) 108.8 mL/hr over 60 Minutes Intravenous On call to O.R. 01/22/20 0804 01/23/20 1137     and started on DVT prophylaxis in the form of Aspirin.   PT and OT were ordered for total joint protocol. Discharge planning consulted to help with postop disposition and equipment needs.  Patient had a fair night on the evening of surgery. They started to get up OOB with therapy on POD #0. She did have difficulty with pain control secondary to history of chronic pain on opioid analgesics at home. Pain medications were adjusted for adequate relief. Pt was seen during rounds and was ready to go home pending progress with therapy.  She worked with therapy on POD #1 and was meeting her goals. Pt was discharged to home later that day in stable condition.  Diet: Regular diet Activity: WBAT Follow-up: in 2 weeks Disposition: Home Discharged Condition: good   Discharge Instructions    Call MD / Call 911   Complete by: As directed    If you experience chest pain or shortness of breath, CALL 911 and be transported to the hospital emergency room.  If you develope a fever above 101 F, pus (white drainage) or increased drainage or redness at the wound, or calf pain, call your surgeon's office.   Change dressing   Complete by: As directed    Maintain surgical dressing until follow up in the clinic. If the edges start to pull up, may reinforce with tape. If the dressing is no longer working, may remove and cover with gauze and tape, but must keep the area dry and clean.  Call with any questions or concerns.   Constipation Prevention   Complete by: As directed    Drink plenty of fluids.  Prune juice may be helpful.  You may use a stool softener, such as Colace (over the counter) 100 mg twice a day.  Use MiraLax (over the counter) for constipation as needed.   Diet - low sodium heart healthy   Complete by: As directed    Discharge instructions   Complete by: As directed    Maintain surgical dressing until  follow up in the clinic. If the edges start to pull up, may reinforce with tape. If the dressing is no longer working, may remove and cover with gauze and tape, but must keep the area dry and clean.  Follow up in 2 weeks at [facility]. Call with any questions or concerns.   Increase activity slowly as tolerated   Complete by: As directed    Weight bearing as tolerated with assist device (walker, cane, etc) as directed, use it as long as suggested by your surgeon or therapist, typically at least 4-6 weeks.   TED hose   Complete by: As directed    Use stockings (TED hose) for 2 weeks on both leg(s).  You may remove them at night for sleeping.     Allergies as of 01/24/2020      Reactions   Coconut Oil Anaphylaxis, Itching   Morphine And Related Anaphylaxis, Hives   Pt states that she has tolerated Norco and Dilaudid   Oxycodone Anaphylaxis   Ciprofloxacin Itching, Rash   Trazodone And Nefazodone Rash   Vistaril [hydroxyzine Hcl] Hives   Doxycycline Nausea And Vomiting   Adhesive [tape] Rash  And paper tape causes a rash if wearing for a prolong period of time   Penicillins Rash   Has patient had a PCN reaction causing immediate rash, facial/tongue/throat swelling, SOB or lightheadedness with hypotension: Yes Has patient had a PCN reaction causing severe rash involving mucus membranes or skin necrosis: No Has patient had a PCN reaction that required hospitalization No Has patient had a PCN reaction occurring within the last 10 years: No If all of the above answers are "NO", then may proceed with Cephalosporin use.      Medication List    STOP taking these medications   diclofenac sodium 1 % Gel Commonly known as: VOLTAREN   fluconazole 150 MG tablet Commonly known as: DIFLUCAN   metroNIDAZOLE 500 MG tablet Commonly known as: Flagyl   sulfamethoxazole-trimethoprim 800-160 MG tablet Commonly known as: BACTRIM DS     TAKE these medications   acetaminophen 500 MG  tablet Commonly known as: TYLENOL Take 2 tablets (1,000 mg total) by mouth every 6 (six) hours as needed.   Aimovig 140 MG/ML Soaj Generic drug: Erenumab-aooe Inject 140 mg into the skin every 30 (thirty) days.   albuterol 108 (90 Base) MCG/ACT inhaler Commonly known as: VENTOLIN HFA Inhale 2 puffs into the lungs every 6 (six) hours as needed for wheezing or shortness of breath.   aspirin 81 MG chewable tablet Chew 1 tablet (81 mg total) by mouth 2 (two) times daily for 28 days.   baclofen 10 MG tablet Commonly known as: LIORESAL TAKE 0.5 TABLETS (5 MG TOTAL) BY MOUTH AT BEDTIME. What changed: See the new instructions.   buPROPion 150 MG 24 hr tablet Commonly known as: WELLBUTRIN XL Take 150 mg by mouth in the morning.   buPROPion 200 MG 12 hr tablet Commonly known as: WELLBUTRIN SR Take 200 mg by mouth in the morning.   busPIRone 10 MG tablet Commonly known as: BUSPAR Take 10 mg by mouth 3 (three) times daily.   celecoxib 200 MG capsule Commonly known as: CELEBREX Take 1 capsule (200 mg total) by mouth 2 (two) times daily.   cyanocobalamin 1000 MCG/ML injection Commonly known as: (VITAMIN B-12) Inject 1,000 mcg into the muscle 2 (two) times a week.   DULoxetine 60 MG capsule Commonly known as: CYMBALTA Take 1 capsule (60 mg total) by mouth 2 (two) times daily.   ferrous sulfate 325 (65 FE) MG tablet Take 1 tablet (325 mg total) by mouth 2 (two) times daily with a meal.   fluticasone 50 MCG/ACT nasal spray Commonly known as: FLONASE Place 2 sprays into both nostrils daily as needed for allergies or rhinitis.   HYDROmorphone 4 MG tablet Commonly known as: DILAUDID Take 1-2 tablets (4-8 mg total) by mouth every 4 (four) hours as needed for moderate pain or severe pain. What changed: how much to take   Linzess 290 MCG Caps capsule Generic drug: linaclotide Take 290 mcg by mouth daily before breakfast.   methocarbamol 500 MG tablet Commonly known as:  ROBAXIN Take 500 mg by mouth every 8 (eight) hours as needed for muscle spasms.   metoCLOPramide 10 MG tablet Commonly known as: REGLAN TAKE 1 TABLET (10 MG TOTAL) BY MOUTH 3 (THREE) TIMES DAILY BEFORE MEALS.   multivitamin with minerals Tabs tablet Take 1 tablet by mouth daily.   nortriptyline 10 MG capsule Commonly known as: PAMELOR Take 3 capsules at bedtime   pantoprazole 40 MG tablet Commonly known as: PROTONIX 180pantoprazole 40 mg tablet,delayed release  TAKE 1  TABLET (40 MG TOTAL) BY MOUTH 2 (TWO) TIMES DAILY BEFORE A MEAL. What changed:   how much to take  how to take this  when to take this  additional instructions   thiamine 100 MG tablet Commonly known as: Vitamin B-1 Take 100 mg by mouth daily at 12 noon.   Vitamin D3 1.25 MG (50000 UT) Caps Take 50,000 Units by mouth every 7 (seven) days.            Discharge Care Instructions  (From admission, onward)         Start     Ordered   01/24/20 0000  Change dressing    Comments: Maintain surgical dressing until follow up in the clinic. If the edges start to pull up, may reinforce with tape. If the dressing is no longer working, may remove and cover with gauze and tape, but must keep the area dry and clean.  Call with any questions or concerns.   01/24/20 0321         Follow-up Information    Paralee Cancel, MD. Schedule an appointment as soon as possible for a visit in 2 weeks.   Specialty: Orthopedic Surgery Contact information: [address] [place] [address] [phone]           Signed: Griffith Citron, PA-C Orthopedic Surgery 01/28/2020, 8:51 AM

## 2020-01-29 ENCOUNTER — Ambulatory Visit: Payer: Self-pay

## 2020-02-05 ENCOUNTER — Other Ambulatory Visit: Payer: Self-pay

## 2020-02-05 ENCOUNTER — Ambulatory Visit (INDEPENDENT_AMBULATORY_CARE_PROVIDER_SITE_OTHER): Payer: 59 | Admitting: Neurology

## 2020-02-05 ENCOUNTER — Encounter: Payer: Self-pay | Admitting: Neurology

## 2020-02-05 DIAGNOSIS — G43711 Chronic migraine without aura, intractable, with status migrainosus: Secondary | ICD-10-CM | POA: Diagnosis not present

## 2020-02-05 MED ORDER — BACLOFEN 10 MG PO TABS: 10.0000 mg | ORAL_TABLET | Freq: Every day | ORAL | 1 refills | Status: DC

## 2020-02-05 NOTE — Progress Notes (Signed)
Reason for visit: Intractable headache  [name] is an 44 y.o. female  History of present illness:  Ms. [name] is a 44 year old right-handed black female with a history of intractable headaches.  The patient has diffuse neuromuscular discomfort, she is followed through the [facility] and is on chronic opioid therapy.  She recently had a right total knee replacement, she continues on Dilaudid.  She has been placed on Aimovig for her headaches which seem to help some, she has had fewer severe headaches, one severe headache each week at this point.  She continues however to have daily headaches of some sort.  She has a lot of neck and back discomfort that is chronic.  She has been on baclofen taking 10 mg at night for leg cramps which has been helpful.  She currently is walking with a walker after the knee surgery.  She continues to have chronic nausea after her bariatric surgery.  She returns for further evaluation.  Past Medical History:  Diagnosis Date  . Anal fissure - posterior 10/16/2014    OCC  ISSUES  . Anxiety    doesn't take anything  . Asthma    has Albuterol inhaler as needed  . Boil    on pubic area started septra 03-01-17 draining blood and pus  . Bronchitis   . Chronic headache disorder 07/22/2016  . Clostridium difficile infection 04/20/2012  . Colitis   . Dehydration   . Depression   . Family history of adverse reaction to anesthesia    pt mom gets sick  . GERD (gastroesophageal reflux disease)    takes Pantoprazole daily  . Headache   . Hiatal hernia    neuropathy - mild in arms and legs  . History of blood transfusion    last transfusion was 04/04/2016=Benadryl was given d/t itching. States she always itches with transfusion.   Marland Kitchen History of bronchitis    > 2 yrs ago  . History of colon polyps    benign  . History of migraine    last one 05/01/16  . History of stomach ulcers   . History of urinary tract infection LAST 2 WEEKS AGO  . IDA (iron  deficiency anemia)   . Internal and external bleeding hemorrhoids 06/11/2014  . Joint pain   . Joint swelling   . Left sided chronic colitis - segmental 06/11/2014  . Marginal ulcer 10/25/2017  . Migraine   . Motion sickness   . Nausea    takes Zofran as needed  . Nausea and vomiting    for 1 year  . Obesity   . Oligouria   . Osteoarthritis    lower back, knees, wrists - no meds  . Peripheral neuropathy 03/01/2019  . Pneumonia 1997  . Postoperative nausea and vomiting 01/21/2016   wants scopolamine patch  . Right carpal tunnel syndrome 03/01/2019  . SVD (spontaneous vaginal delivery)    x 4  . Tingling    BOTH LOWER EXTREMETIES ALL THE TIME DUE TO RAPID WEIGHT LOSS  . TOBACCO USER 10/02/2009   Qualifier: Diagnosis of  By: Dimas Millin MD, Ellard Artis    . Transfusion history    last transfusion 6'16   . Tremor 08/31/2018  . UC (ulcerative colitis) ([place])    supposed to be taking Lialda and Bentyl but has been off since gastric sleeve  . Vertigo    doesn't take any meds    Past Surgical History:  Procedure Laterality Date  . ABDOMINAL  HYSTERECTOMY     PARTIAL  . COLONOSCOPY  2007   for rectal bleeding; [facility]  . COLONOSCOPY N/A 06/11/2014   Procedure: COLONOSCOPY;  Surgeon: Gatha Mayer, MD;  Location: [facility];  Service: Endoscopy;  Laterality: N/A;  . COLONOSCOPY WITH PROPOFOL N/A 03/02/2017   Procedure: COLONOSCOPY WITH PROPOFOL;  Surgeon: Alphonsa Overall, MD;  [facility]: [facility];  Service: General;  Laterality: N/A;  . DILATION AND CURETTAGE OF UTERUS    . ESOPHAGOGASTRODUODENOSCOPY (EGD) WITH PROPOFOL N/A 04/06/2016   Procedure: ESOPHAGOGASTRODUODENOSCOPY (EGD) WITH PROPOFOL;  Surgeon: Alphonsa Overall, MD;  [facility];  Service: General;  Laterality: N/A;  . ESOPHAGOGASTRODUODENOSCOPY (EGD) WITH PROPOFOL N/A 03/02/2017   Procedure: ESOPHAGOGASTRODUODENOSCOPY (EGD) WITH PROPOFOL;  Surgeon: Alphonsa Overall, MD;  Location: [facility];  Service: General;  Laterality: N/A;  .  ESOPHAGOGASTRODUODENOSCOPY (EGD) WITH PROPOFOL N/A 05/20/2017   Procedure: ESOPHAGOGASTRODUODENOSCOPY (EGD) WITH PROPOFOL ERAS PATHWAY;  Surgeon: Alphonsa Overall, MD;  Location: [facility];  Service: General;  Laterality: N/A;  . ESOPHAGOGASTRODUODENOSCOPY (EGD) WITH PROPOFOL N/A 10/07/2017   Procedure: ESOPHAGOGASTRODUODENOSCOPY (EGD) WITH PROPOFOL;  Surgeon: Alphonsa Overall, MD;  Location: [facility];  Service: General;  Laterality: N/A;  . EXCISION OF SKIN TAG  06/08/2017   Procedure: EXCISION OF VULVAR SKIN TAGS X2;  Surgeon: [name], MD;  Location: [facility];  Service: Gynecology;;  . FOOT SURGERY Bilateral    x 2  . GASTRIC ROUX-EN-Y N/A 12/14/2016   Procedure: LAPAROSCOPIC REVISION SLEEVE GASTRECTOMY TO  ROUX-Y-GASTRIC BY-PASS, UPPER ENDO;  Surgeon: Excell Seltzer, MD;  [facility];  Service: General;  Laterality: N/A;  . GASTROJEJUNOSTOMY N/A 05/11/2016   Procedure: LAPAROSCOPIC PLACEMENT  OF FEEDING  JEJUNOSTOMY TUBE;  Surgeon: Excell Seltzer, MD;  [facility];  Service: General;  Laterality: N/A;  . HEMORRHOIDECTOMY WITH HEMORRHOID BANDING    . IR FLUORO GUIDE CV LINE RIGHT  04/06/2017  . IR GENERIC HISTORICAL  05/19/2016   IR CM INJ ANY COLONIC TUBE W/FLUORO 05/19/2016 Aletta Edouard, MD [facility]  . IR PATIENT EVAL TECH 0-60 MINS  11/30/2017  . IR REPLC DUODEN/JEJUNO TUBE PERCUT W/FLUORO  03/14/2018  . IR REPLC DUODEN/JEJUNO TUBE PERCUT W/FLUORO  03/15/2018  . IR US GUIDE VASC ACCESS RIGHT  04/06/2017  . j tube removed march 2018    . KNEE ARTHROSCOPY Left 09/06/2014  . LAPAROSCOPIC GASTRIC SLEEVE RESECTION N/A 01/14/2016   Procedure: LAPAROSCOPIC GASTRIC SLEEVE RESECTION;  Surgeon: Excell Seltzer, MD;  [facility];  Service: General;  Laterality: N/A;  . LAPAROSCOPIC REVISION OF GASTROJEJUNOSTOMY N/A 10/25/2017   Procedure: LAPAROSCOPIC REVISION OF GASTROJEJUNOSTOMY AND PARTIAL GASTRECTOMY, WITH PLACEMENT OF FEEDING GASTROSTOMY TUBE;  Surgeon: Excell Seltzer, MD;  [facility];  Service: General;  Laterality: N/A;  . LAPAROSCOPIC TUBAL LIGATION  10/16/2011   Procedure: LAPAROSCOPIC TUBAL LIGATION;  Surgeon: Alwyn Pea, MD;  Location: [facility];  Service: Gynecology;  Laterality: Bilateral;  . NOVASURE ABLATION  09/28/2010   mild persistent vaginal bleeding  . right knee arthroscopy     05/07/2016  . TOTAL KNEE ARTHROPLASTY Left 03/10/2018   Procedure: LEFT TOTAL KNEE ARTHROPLASTY;  Surgeon: Paralee Cancel, MD;  [facility];  Service: Orthopedics;  Laterality: Left;  70 mins  . TOTAL KNEE ARTHROPLASTY Right 01/23/2020   Procedure: TOTAL KNEE ARTHROPLASTY, BILATERAL TROCHANTERIC INJECTION;  Surgeon: Paralee Cancel, MD;  [facility];  Service: Orthopedics;  Laterality: Right;  70 mins for Bilateral Troch injection 1 cc Depo and 2 CC  Lidocaine  . TUBAL LIGATION    . VAGINAL HYSTERECTOMY Bilateral 06/08/2017   Procedure: HYSTERECTOMY VAGINAL W/ BILATERAL SALPINGECTOMY;  Surgeon: [name], MD;  Location: [facility];  Service: Gynecology;  Laterality: Bilateral;  . wisdom teeth extracted      Family History  Problem Relation Age of Onset  . Hypertension Mother   . Diabetes Mother   . Sarcoidosis Mother        lungs and skin  . Asthma Mother   . Hypertension Father   . Diabetes Father   . Asthma Son   . Tics Son   . Asthma Sister   . Cancer Sister        possible pancreatic cancer  . Adrenal disorder Sister        Tumor   . Asthma Brother   . Asthma Daughter        died age 34.5  . Cancer Daughter 4       brain; died age 34.5  . Asthma Son   . Cancer Paternal Aunt        brain, colon, lung and esophagus; unsure of primary   . Breast cancer Paternal Aunt     Social history:  reports that she quit smoking about 5 years ago. Her smoking use included cigarettes. She has a 10.00 pack-year smoking history. She has never used smokeless tobacco. She reports current alcohol use. She reports that she does not use drugs.      Allergies  Allergen Reactions  . Coconut Oil Anaphylaxis and Itching  . Morphine And Related Anaphylaxis and Hives    Pt states that she has tolerated Norco and Dilaudid  . Oxycodone Anaphylaxis  . Ciprofloxacin Itching and Rash  . Trazodone And Nefazodone Rash  . Vistaril [Hydroxyzine Hcl] Hives  . Doxycycline Nausea And Vomiting  . Adhesive [Tape] Rash    And paper tape causes a rash if wearing for a prolong period of time  . Penicillins Rash    Has patient had a PCN reaction causing immediate rash, facial/tongue/throat swelling, SOB or lightheadedness with hypotension: Yes Has patient had a PCN reaction causing severe rash involving mucus membranes or skin necrosis: No Has patient had a PCN reaction that required hospitalization No Has patient had a PCN reaction occurring within the last 10 years: No If all of the above answers are "NO", then may proceed with Cephalosporin use.    Medications:  Prior to Admission medications   Medication Sig Start Date End Date Taking? Authorizing Provider  acetaminophen (TYLENOL) 500 MG tablet Take 2 tablets (1,000 mg total) by mouth every 6 (six) hours as needed. 01/24/20  Yes Maurice March, PA-C  albuterol (VENTOLIN HFA) 108 (90 Base) MCG/ACT inhaler Inhale 2 puffs into the lungs every 6 (six) hours as needed for wheezing or shortness of breath.   Yes [provider]  aspirin 81 MG chewable tablet Chew 1 tablet (81 mg total) by mouth 2 (two) times daily for 28 days. 01/24/20 02/21/20 Yes Stinson, Nicola Girt, PA-C  baclofen (LIORESAL) 10 MG tablet TAKE 0.5 TABLETS (5 MG TOTAL) BY MOUTH AT BEDTIME. Patient taking differently: Take 5 mg by mouth at bedtime.  08/17/19  Yes Kathrynn Ducking, MD  buPROPion [facility] SR) 200 MG 12 hr tablet Take 200 mg by mouth in the morning.    Yes [provider]  buPROPion (WELLBUTRIN XL) 150 MG 24 hr tablet Take 150 mg by mouth in the morning.    Yes  [provider]  buPROPion (ZYBAN)  150 MG 12 hr tablet bupropion HCl 150 mg tablet,12 hr sustained-release(smoking deterrent)  Take 1 tablet twice a day by oral route.   Yes [provider]  busPIRone (BUSPAR) 10 MG tablet Take 10 mg by mouth 3 (three) times daily.   Yes [provider]  celecoxib (CELEBREX) 200 MG capsule Take 1 capsule (200 mg total) by mouth 2 (two) times daily. 01/24/20  Yes Maurice March, PA-C  Cholecalciferol (VITAMIN D3) 1.25 MG (50000 UT) CAPS Take 50,000 Units by mouth every 7 (seven) days.  04/03/19  Yes [provider]  cyanocobalamin (,VITAMIN B-12,) 1000 MCG/ML injection Inject 1,000 mcg into the muscle 2 (two) times a week.  04/06/19  Yes [provider]  DULoxetine (CYMBALTA) 60 MG capsule Take 1 capsule (60 mg total) by mouth 2 (two) times daily. 04/10/19  Yes Connye Burkitt, NP  Erenumab-aooe (AIMOVIG) 140 MG/ML SOAJ Inject 140 mg into the skin every 30 (thirty) days. 08/07/19  Yes Suzzanne Cloud, NP  ferrous sulfate 325 (65 FE) MG tablet Take 1 tablet (325 mg total) by mouth 2 (two) times daily with a meal. 01/24/20  Yes Maurice March, PA-C  fluticasone (FLONASE) 50 MCG/ACT nasal spray Place 2 sprays into both nostrils daily as needed for allergies or rhinitis.   Yes [provider]  HYDROmorphone (DILAUDID) 4 MG tablet Take 1-2 tablets (4-8 mg total) by mouth every 4 (four) hours as needed for moderate pain or severe pain. Patient taking differently: Take 8 mg by mouth every 4 (four) hours as needed for moderate pain or severe pain.  01/24/20  Yes Maurice March, PA-C  linaclotide (LINZESS) 290 MCG CAPS capsule Take 290 mcg by mouth daily before breakfast.   Yes [provider]  linaclotide (LINZESS) 290 MCG CAPS capsule Linzess 290 mcg capsule  Take 1 capsule every day by oral route.   Yes [provider]  methocarbamol (ROBAXIN) 500 MG tablet Take 500 mg by mouth every 8 (eight) hours as needed for muscle spasms.    Yes [provider]  metoCLOPramide (REGLAN) 10 MG tablet TAKE 1 TABLET (10 MG TOTAL) BY MOUTH 3 (THREE) TIMES DAILY BEFORE MEALS. 01/15/20  Yes Gatha Mayer, MD  Multiple Vitamin (MULTIVITAMIN WITH MINERALS) TABS tablet Take 1 tablet by mouth daily.   Yes [provider]  pantoprazole (PROTONIX) 40 MG tablet 180pantoprazole 40 mg tablet,delayed release  TAKE 1 TABLET (40 MG TOTAL) BY MOUTH 2 (TWO) TIMES DAILY BEFORE A MEAL. Patient taking differently: Take 40 mg by mouth 2 (two) times daily. Take 40 mg bid 07/31/19  Yes Gatha Mayer, MD  thiamine (VITAMIN B-1) 100 MG tablet Take 100 mg by mouth daily at 12 noon.    Yes [provider]    ROS:  Out of a complete 14 system review of symptoms, the patient complains only of the following symptoms, and all other reviewed systems are negative.  Chronic neck, shoulder pain, hip pain Headache Right knee pain  Blood pressure 100/70, pulse 86, temperature (!) 96.8 F (36 C), height 5' 6"  (1.676 m), weight 176 lb (79.8 kg), last menstrual period 06/02/2017.  Physical Exam  General: The patient is alert and cooperative at the time of the examination.   Neuromuscular: Range of movement the cervical spine is full.  Skin: No significant peripheral edema is noted.   Neurologic Exam  Mental status: The patient is alert and oriented  x 3 at the time of the examination. The patient has apparent normal recent and remote memory, with an apparently normal attention span and concentration ability.   Cranial nerves: Facial symmetry is present. Speech is normal, no aphasia or dysarthria is noted. Extraocular movements are full. Visual fields are full.  Motor: The patient has good strength in all 4 extremities.  Sensory examination: Soft touch sensation is symmetric on the face, arms, and legs.  Coordination: The patient has good finger-nose-finger and heel-to-shin bilaterally.  Gait and station: The patient currently is using a  walker for ambulation following right knee surgery.  Reflexes: Deep tendon reflexes are symmetric.   Assessment/Plan:  1.  Chronic daily headache, migraine  2.  Chronic neuromuscular discomfort, possible fibromyalgia  3.  History of chronic opiate use  The patient has gained some benefit from [place] but she continues to have some chronic daily headache.  The patient will be given a trial on Botox therapy.  She will come off of the [place] when this is started.  The patient will follow up in 4 months.  A prescription was sent in for the baclofen.  Jill Alexanders MD 02/05/2020 9:19 AM  [facility] [address] [place], [place] [address]  Phone [phone] Fax [phone]

## 2020-02-07 ENCOUNTER — Encounter: Payer: Self-pay | Admitting: Adult Health Nurse Practitioner

## 2020-02-07 DIAGNOSIS — Z87898 Personal history of other specified conditions: Secondary | ICD-10-CM | POA: Insufficient documentation

## 2020-02-07 DIAGNOSIS — B379 Candidiasis, unspecified: Secondary | ICD-10-CM | POA: Insufficient documentation

## 2020-02-07 DIAGNOSIS — R300 Dysuria: Secondary | ICD-10-CM | POA: Insufficient documentation

## 2020-02-07 DIAGNOSIS — R350 Frequency of micturition: Secondary | ICD-10-CM | POA: Insufficient documentation

## 2020-02-07 MED ORDER — METRONIDAZOLE 500 MG PO TABS
500.0000 mg | ORAL_TABLET | Freq: Three times a day (TID) | ORAL | 0 refills | Status: DC
Start: 2020-02-07 — End: 2020-04-02

## 2020-02-07 NOTE — Progress Notes (Signed)
This encounter was created in error - please disregard.

## 2020-02-07 NOTE — Progress Notes (Signed)
Chief Complaint  Patient presents with  . possible UTI    Pt stated that has tinks she has a UTI which has been going on for the pass 5 days accompied with some pressure and very llittle urine.    HPI   Patient presents with dysuria and small amount of urine when urinating.  Some pressure and vaginal discomfort.  Mild discharge.  No fever or night sweats.   Problem List    Problem List: 2021-05: Chronic migraine without aura, with intractable migraine, so  stated, with status migrainosus 2021-04: S/P right TKA 2021-04: Status post total knee replacement, right 2021-01: Acquired spondylolysis 2021-01: Chronic low back pain 2020-08: Headache 2020-08: Gait instability 2020-07: MDD (major depressive disorder), recurrent severe, without  psychosis ([place]) 2020-06: Peripheral neuropathy 2020-06: Right carpal tunnel syndrome 2020-04: Persistent vomiting after bariatric surgery 2020-04: Chronic constipation 2019-12: Tremor 2019-06: Postoperative anemia due to acute blood loss 2019-06: S/P left TKA 2019-06: S/P total knee replacement 2019-03: B12 deficiency 2019-02: Precordial chest pain 2019-02: Rapid or irregular heartbeat 2019-02: Chest pain at rest 2019-02: Right shoulder pain 2019-01: Marginal ulcer 2018-09: S/P vaginal hysterectomy 2018-07: Dehydration 2018-06: Malnutrition, calorie ([place]) 2018-06: Psychophysiological insomnia 2018-03: Nausea and vomiting 2017-10: Paresthesia 2017-10: Chronic headache disorder 2017-10: Memory difficulty 2017-07: GI bleed 2017-05: S/P laparoscopic sleeve gastrectomy 2017-04: Postoperative nausea and vomiting 2017-04: Morbid obesity with BMI of 45.0-49.9, adult ([place]) 2017-01: Anemia, iron deficiency 2016-08: Skin lesion 2016-01: Anal fissure - posterior 2016-01: Severe obesity (BMI >= 40) ([place]) 2015-09: Left sided chronic colitis - segmental 2015-09: Internal and external bleeding hemorrhoids 2013-07: Abdominal pain 2013-07: Asthma  2013-07: Hypokalemia 2013-07: Dehydration 2013-07: C. difficile colitis 2013-01: Excessive or frequent menstruation 2011-01: TOBACCO USER 2011-01: Reactive depression 2011-01: UPPER RESPIRATORY INFECTION 2011-01: HEMATOCHEZIA, HX OF 2009-02: INTERNAL HEMORRHOIDS Suicidal ideations   Allergies   is allergic to coconut oil; morphine and related; oxycodone; ciprofloxacin; trazodone and nefazodone; vistaril [hydroxyzine hcl]; doxycycline; adhesive [tape]; and penicillins.  Medications    Current Outpatient Medications:  .  buPROPion (WELLBUTRIN SR) 200 MG 12 hr tablet, Take 200 mg by mouth in the morning. , Disp: , Rfl:  .  buPROPion (WELLBUTRIN XL) 150 MG 24 hr tablet, Take 150 mg by mouth in the morning. , Disp: , Rfl:  .  Cholecalciferol (VITAMIN D3) 1.25 MG (50000 UT) CAPS, Take 50,000 Units by mouth every 7 (seven) days. , Disp: , Rfl:  .  cyanocobalamin (,VITAMIN B-12,) 1000 MCG/ML injection, Inject 1,000 mcg into the muscle 2 (two) times a week. , Disp: , Rfl:  .  DULoxetine (CYMBALTA) 60 MG capsule, Take 1 capsule (60 mg total) by mouth 2 (two) times daily., Disp: 60 capsule, Rfl: 0 .  Erenumab-aooe (AIMOVIG) 140 MG/ML SOAJ, Inject 140 mg into the skin every 30 (thirty) days., Disp: 1 pen, Rfl: 6 .  methocarbamol (ROBAXIN) 500 MG tablet, Take 500 mg by mouth every 8 (eight) hours as needed for muscle spasms. , Disp: , Rfl:  .  acetaminophen (TYLENOL) 500 MG tablet, Take 2 tablets (1,000 mg total) by mouth every 6 (six) hours as needed., Disp: 30 tablet, Rfl: 0 .  albuterol (VENTOLIN HFA) 108 (90 Base) MCG/ACT inhaler, Inhale 2 puffs into the lungs every 6 (six) hours as needed for wheezing or shortness of breath., Disp: , Rfl:  .  aspirin 81 MG chewable tablet, Chew 1 tablet (81 mg total) by mouth 2 (two) times daily for 28 days., Disp: 56 tablet, Rfl: 0 .  baclofen (LIORESAL) 10 MG  tablet, Take 1 tablet (10 mg total) by mouth at bedtime., Disp: 90 tablet, Rfl: 1 .  buPROPion  (ZYBAN) 150 MG 12 hr tablet, bupropion HCl 150 mg tablet,12 hr sustained-release(smoking deterrent)  Take 1 tablet twice a day by oral route., Disp: , Rfl:  .  busPIRone (BUSPAR) 10 MG tablet, Take 10 mg by mouth 3 (three) times daily., Disp: , Rfl:  .  celecoxib (CELEBREX) 200 MG capsule, Take 1 capsule (200 mg total) by mouth 2 (two) times daily., Disp: 60 capsule, Rfl: 0 .  ferrous sulfate 325 (65 FE) MG tablet, Take 1 tablet (325 mg total) by mouth 2 (two) times daily with a meal., Disp: 28 tablet, Rfl: 3 .  fluticasone (FLONASE) 50 MCG/ACT nasal spray, Place 2 sprays into both nostrils daily as needed for allergies or rhinitis., Disp: , Rfl:  .  HYDROmorphone (DILAUDID) 4 MG tablet, Take 1-2 tablets (4-8 mg total) by mouth every 4 (four) hours as needed for moderate pain or severe pain. (Patient taking differently: Take 8 mg by mouth every 4 (four) hours as needed for moderate pain or severe pain. ), Disp: , Rfl: 0 .  linaclotide (LINZESS) 290 MCG CAPS capsule, Take 290 mcg by mouth daily before breakfast., Disp: , Rfl:  .  linaclotide (LINZESS) 290 MCG CAPS capsule, Linzess 290 mcg capsule  Take 1 capsule every day by oral route., Disp: , Rfl:  .  metoCLOPramide (REGLAN) 10 MG tablet, TAKE 1 TABLET (10 MG TOTAL) BY MOUTH 3 (THREE) TIMES DAILY BEFORE MEALS., Disp: 90 tablet, Rfl: 0 .  Multiple Vitamin (MULTIVITAMIN WITH MINERALS) TABS tablet, Take 1 tablet by mouth daily., Disp: , Rfl:  .  pantoprazole (PROTONIX) 40 MG tablet, 180pantoprazole 40 mg tablet,delayed release  TAKE 1 TABLET (40 MG TOTAL) BY MOUTH 2 (TWO) TIMES DAILY BEFORE A MEAL. (Patient taking differently: Take 40 mg by mouth 2 (two) times daily. Take 40 mg bid), Disp: 180 tablet, Rfl: 1 .  thiamine (VITAMIN B-1) 100 MG tablet, Take 100 mg by mouth daily at 12 noon. , Disp: , Rfl:    Review of Systems    Constitutional: Negative for activity change, appetite change, chills and fever.  HENT: Negative for congestion, nosebleeds,  trouble swallowing and voice change.   Respiratory: Negative for cough, shortness of breath and wheezing.   Cardiac:  Negative for chest pain, pressure, syncope  Gastrointestinal: Negative for diarrhea, nausea and vomiting.  Genitourinary: positive for dysuria and vaginal discharge Musculoskeletal: Negative for back pain, joint swelling and neck pain.  Neurological: Negative for dizziness, speech difficulty, light-headedness and numbness.  See HPI. All other review of systems negative.     Physical Exam:    height is 5' 5"  (1.651 m) and weight is 170 lb 6.4 oz (77.3 kg). Her temporal temperature is 97.8 F (36.6 C). Her blood pressure is 119/79 and her pulse is 90. Her oxygen saturation is 97%.   Physical Examination: General appearance - alert, well appearing, and in no distress and oriented to person, place, and time Mental status - normal mood, behavior, speech, dress, motor activity, and thought processes Eyes - PERRL. Extraocular movements intact.  No nystagmus.  Neck - supple, no significant adenopathy, carotids upstroke normal bilaterally, no bruits, thyroid exam: thyroid is normal in size without nodules or tenderness Chest - clear to auscultation, no wheezes, rales or rhonchi, symmetric air entry  Heart - normal rate, regular rhythm, normal S1, S2, no murmurs, rubs, clicks or gallops GU:  No CVA tenderness.  No abdominal tenderness on exa Extremities - dependent LE edema without clubbing or cyanosis Skin - normal coloration and turgor, no rashes, no suspicious skin lesions noted  No hyperpigmentation of skin.  No current hematomas noted   Lab /Imaging Review    UA reviewed showing no infection. KOH positive for Bacterial Vaginosis   Assessment & Plan:  [name] is a 44 y.o. female    1. Dysuria   2. Urinary frequency   3. History of urinary urgency   4. Yeast infection    Orders Placed This Encounter  Procedures  . POCT Wet + KOH Prep   Meds ordered this  encounter  Medications  . fluconazole (DIFLUCAN) 150 MG tablet    Sig: Take 1 tablet (150 mg total) by mouth daily for 4 doses.    Dispense:  4 tablet    Refill:  3  . metroNIDAZOLE (FLAGYL) 500 MG tablet    Sig: Take 1 tablet (500 mg total) by mouth 3 (three) times daily.    Dispense:  21 tablet    Refill:  0     Glyn Ade, NP

## 2020-02-12 ENCOUNTER — Other Ambulatory Visit: Payer: Self-pay | Admitting: Neurology

## 2020-02-12 ENCOUNTER — Ambulatory Visit: Payer: 59 | Attending: Internal Medicine

## 2020-02-12 DIAGNOSIS — Z23 Encounter for immunization: Secondary | ICD-10-CM

## 2020-02-12 NOTE — Progress Notes (Signed)
   Covid-19 Vaccination Clinic  Name:  [name]    MRN: [id number] DOB: [birth year]  02/12/2020  [name] was observed post Covid-19 immunization for 15 minutes without incident. She was provided with Vaccine Information Sheet and instruction to access the V-Safe system.   [name] was instructed to call 911 with any severe reactions post vaccine: Marland Kitchen Difficulty breathing  . Swelling of face and throat  . A fast heartbeat  . A bad rash all over body  . Dizziness and weakness   Immunizations Administered    Name Date Dose VIS Date Route   Pfizer COVID-19 Vaccine 02/12/2020 10:24 AM 0.3 mL 11/22/2018 Intramuscular   Manufacturer: [place]   Lot: [id number]   [place]: [id number]

## 2020-02-13 ENCOUNTER — Telehealth: Payer: Self-pay | Admitting: Neurology

## 2020-02-13 NOTE — Telephone Encounter (Signed)
I called patient and got her scheduled on 7/6 for Botox appointment. Will begin PA with [employer].

## 2020-02-14 MED ORDER — BOTOX 200 UNITS IJ SOLR: INTRAMUSCULAR | 1 refills | Status: DC

## 2020-02-14 NOTE — Addendum Note (Signed)
Addended by: Marval Regal on: 02/14/2020 03:06 PM   Modules accepted: Orders

## 2020-02-14 NOTE — Telephone Encounter (Signed)
Botox 200 units sent to [facility]. Hinton Dyer and Lovena Le made aware.

## 2020-02-14 NOTE — Telephone Encounter (Addendum)
I called [employer] [phone]) and spoke with [place], who states codes [phone] and [phone] are valid and billable and do not require PA. Ref #[id number]. Please send prescription to [facility].

## 2020-02-19 NOTE — Telephone Encounter (Signed)
I called [facility] [phone]) to check status and schedule delivery. I spoke with Rowi P. He attempted to contact patient to gain consent while I was on the line. He was able to reach patient, but she stated that she was unable to afford it right now. He provided patient with Botox savings program and she stated she would call back once she called them. Ref #[id number].

## 2020-02-20 NOTE — Telephone Encounter (Signed)
Jeneen Rinks with [facility] called me to schedule delivery of patients medication. TBD 02/22/20.

## 2020-02-22 ENCOUNTER — Other Ambulatory Visit: Payer: Self-pay | Admitting: Internal Medicine

## 2020-02-23 NOTE — Telephone Encounter (Signed)
Medication delivered 02/22/20 for patient's 04/02/20 appointment. Botox 200U x1 vial.

## 2020-04-02 ENCOUNTER — Ambulatory Visit (INDEPENDENT_AMBULATORY_CARE_PROVIDER_SITE_OTHER): Payer: 59 | Admitting: Neurology

## 2020-04-02 ENCOUNTER — Encounter: Payer: Self-pay | Admitting: Neurology

## 2020-04-02 ENCOUNTER — Other Ambulatory Visit: Payer: Self-pay

## 2020-04-02 VITALS — BP 130/60 | HR 76 | Wt 166.0 lb

## 2020-04-02 DIAGNOSIS — G43711 Chronic migraine without aura, intractable, with status migrainosus: Secondary | ICD-10-CM | POA: Diagnosis not present

## 2020-04-02 NOTE — Progress Notes (Signed)
Botox injection 200 units  Expires  09/2022 Lot number :[id number] [place] for patient.

## 2020-04-02 NOTE — Procedures (Signed)
     BOTOX PROCEDURE NOTE FOR MIGRAINE HEADACHE   HISTORY: [name] is a 44 year old patient with a history of intractable migraine headaches, she is currently having virtually daily headaches, she failed Aimovig.  She comes in for her first Botox therapy today.   Description of procedure:  The patient was placed in a sitting position. The standard protocol was used for Botox as follows, with 5 units of Botox injected at each site:   -Procerus muscle, midline injection  -Corrugator muscle, bilateral injection  -Frontalis muscle, bilateral injection, with 2 sites each side, medial injection was performed in the upper one third of the frontalis muscle, in the region vertical from the medial inferior edge of the superior orbital rim. The lateral injection was again in the upper one third of the forehead vertically above the lateral limbus of the cornea, 1.5 cm lateral to the medial injection site.  -Temporalis muscle injection, 4 sites, bilaterally. The first injection was 3 cm above the tragus of the ear, second injection site was 1.5 cm to 3 cm up from the first injection site in line with the tragus of the ear. The third injection site was 1.5-3 cm forward between the first 2 injection sites. The fourth injection site was 1.5 cm posterior to the second injection site.  -Occipitalis muscle injection, 3 sites, bilaterally. The first injection was done one half way between the occipital protuberance and the tip of the mastoid process behind the ear. The second injection site was done lateral and superior to the first, 1 fingerbreadth from the first injection. The third injection site was 1 fingerbreadth superiorly and medially from the first injection site.  -Cervical paraspinal muscle injection, 2 sites, bilateral, the first injection site was 1 cm from the midline of the cervical spine, 3 cm inferior to the lower border of the occipital protuberance. The second injection site was 1.5 cm  superiorly and laterally to the first injection site.  -Trapezius muscle injection was performed at 3 sites, bilaterally. The first injection site was in the upper trapezius muscle halfway between the inflection point of the neck, and the acromion. The second injection site was one half way between the acromion and the first injection site. The third injection was done between the first injection site and the inflection point of the neck.   A 200 unit bottle of Botox was used, 155 units were injected, the rest of the Botox was wasted. The patient tolerated the procedure well, there were no complications of the above procedure.  Botox NDC 2482-5003-70 Lot number [id number] Expiration date January 2024

## 2020-06-13 NOTE — Progress Notes (Signed)
PATIENT: [name] DOB: 07/01/1976  REASON FOR VISIT: follow up HISTORY FROM: patient  HISTORY OF PRESENT ILLNESS: Today 06/17/20  Ms. Criscione is a 44 year old female with history of intractable headache.  She had her first Botox therapy in July 2021. Before botox having near daily significant migraine, since Botox still 5-6 headaches a week, but only 3 are significant migraine.  She has stopped Aimovig, Botox has been more helpful, the first month after Botox, the headaches were best.  She has chronic colitis, had syncope episode a few weeks ago, following diarrhea.  She is on chronic opioid therapy for chronic pain.  Her psychiatrist has recently adjusted her Wellbutrin.  Had a right knee replacement a few months ago.  Is on disability.  Takes baclofen at bedtime for leg cramps.  For headache, takes Tylenol.  Presents today for evaluation unaccompanied.  HISTORY 02/05/2020 Dr. Jannifer Franklin: Ms. Bessette is a 44 year old right-handed black female with a history of intractable headaches.  The patient has diffuse neuromuscular discomfort, she is followed through the [facility] and is on chronic opioid therapy.  She recently had a right total knee replacement, she continues on Dilaudid.  She has been placed on Aimovig for her headaches which seem to help some, she has had fewer severe headaches, one severe headache each week at this point.  She continues however to have daily headaches of some sort.  She has a lot of neck and back discomfort that is chronic.  She has been on baclofen taking 10 mg at night for leg cramps which has been helpful.  She currently is walking with a walker after the knee surgery.  She continues to have chronic nausea after her bariatric surgery.  She returns for further evaluation.   REVIEW OF SYSTEMS: Out of a complete 14 system review of symptoms, the patient complains only of the following symptoms, and all other reviewed systems are  negative.  Headache  ALLERGIES: Allergies  Allergen Reactions  . Coconut Oil Anaphylaxis and Itching  . Morphine And Related Anaphylaxis and Hives    Pt states that she has tolerated Norco and Dilaudid  . Oxycodone Anaphylaxis  . Ciprofloxacin Itching and Rash  . Trazodone And Nefazodone Rash  . Vistaril [Hydroxyzine Hcl] Hives  . Doxycycline Nausea And Vomiting  . Adhesive [Tape] Rash    And paper tape causes a rash if wearing for a prolong period of time  . Penicillins Rash    Has patient had a PCN reaction causing immediate rash, facial/tongue/throat swelling, SOB or lightheadedness with hypotension: Yes Has patient had a PCN reaction causing severe rash involving mucus membranes or skin necrosis: No Has patient had a PCN reaction that required hospitalization No Has patient had a PCN reaction occurring within the last 10 years: No If all of the above answers are "NO", then may proceed with Cephalosporin use.    HOME MEDICATIONS: Outpatient Medications Prior to Visit  Medication Sig Dispense Refill  . acetaminophen (TYLENOL) 500 MG tablet Take 2 tablets (1,000 mg total) by mouth every 6 (six) hours as needed. 30 tablet 0  . albuterol (VENTOLIN HFA) 108 (90 Base) MCG/ACT inhaler Inhale 2 puffs into the lungs every 6 (six) hours as needed for wheezing or shortness of breath.    . baclofen (LIORESAL) 10 MG tablet Take 1 tablet (10 mg total) by mouth at bedtime. 90 tablet 1  . Botulinum Toxin Type A (BOTOX) 200 units SOLR Provider to inject 155 units into  the muscles of head and neck every 3 months. Discard remainder. 1 each 1  . buPROPion (WELLBUTRIN SR) 150 MG 12 hr tablet TAKE 1 TABLET(S) BY MOUTH EVERY MORNING FOR DEPRESSION    . buPROPion (WELLBUTRIN XL) 300 MG 24 hr tablet Take 300 mg by mouth every morning.    Marland Kitchen buPROPion (ZYBAN) 150 MG 12 hr tablet bupropion HCl 150 mg tablet,12 hr sustained-release(smoking deterrent)  Take 1 tablet twice a day by oral route.    . busPIRone  (BUSPAR) 10 MG tablet Take 10 mg by mouth 3 (three) times daily.    . celecoxib (CELEBREX) 200 MG capsule Take 1 capsule (200 mg total) by mouth 2 (two) times daily. 60 capsule 0  . Cholecalciferol (VITAMIN D3) 1.25 MG (50000 UT) CAPS Take 50,000 Units by mouth every 7 (seven) days.     . cyanocobalamin (,VITAMIN B-12,) 1000 MCG/ML injection Inject 1,000 mcg into the muscle 2 (two) times a week.     . DULoxetine (CYMBALTA) 60 MG capsule Take 1 capsule (60 mg total) by mouth 2 (two) times daily. 60 capsule 0  . ferrous sulfate 325 (65 FE) MG tablet Take 1 tablet (325 mg total) by mouth 2 (two) times daily with a meal. 28 tablet 3  . fluticasone (FLONASE) 50 MCG/ACT nasal spray Place 2 sprays into both nostrils daily as needed for allergies or rhinitis.    Marland Kitchen HYDROmorphone (DILAUDID) 4 MG tablet Take 1-2 tablets (4-8 mg total) by mouth every 4 (four) hours as needed for moderate pain or severe pain. (Patient taking differently: Take 8 mg by mouth every 4 (four) hours as needed for moderate pain or severe pain. )  0  . linaclotide (LINZESS) 290 MCG CAPS capsule Take 290 mcg by mouth daily before breakfast.    . mesalamine (LIALDA) 1.2 g EC tablet Lialda 1.2 gram tablet,delayed release    . methocarbamol (ROBAXIN) 500 MG tablet Take 500 mg by mouth every 8 (eight) hours as needed for muscle spasms.     . metoCLOPramide (REGLAN) 10 MG tablet TAKE 1 TABLET (10 MG TOTAL) BY MOUTH 3 (THREE) TIMES DAILY BEFORE MEALS. 90 tablet 0  . Multiple Vitamin (MULTIVITAMIN WITH MINERALS) TABS tablet Take 1 tablet by mouth daily.    Marland Kitchen NARCAN 4 MG/0.1ML LIQD nasal spray kit 1 spray as directed.    . pantoprazole (PROTONIX) 40 MG tablet TAKE 1 TABLET BY MOUTH TWICE A DAY BEFORE A MEAL 180 tablet 1  . sucralfate (CARAFATE) 1 g tablet Take 1 g by mouth 4 (four) times daily.    Marland Kitchen thiamine (VITAMIN B-1) 100 MG tablet Take 100 mg by mouth daily at 12 noon.     Eduard Roux (AIMOVIG) 140 MG/ML SOAJ Inject 140 mg into the skin  every 30 (thirty) days. 1 pen 6  . buPROPion (WELLBUTRIN SR) 200 MG 12 hr tablet Take 200 mg by mouth in the morning.     Marland Kitchen buPROPion (WELLBUTRIN XL) 150 MG 24 hr tablet Take 150 mg by mouth in the morning.      No facility-administered medications prior to visit.    PAST MEDICAL HISTORY: Past Medical History:  Diagnosis Date  . Anal fissure - posterior 10/16/2014    OCC  ISSUES  . Anxiety    doesn't take anything  . Asthma    has Albuterol inhaler as needed  . Boil    on pubic area started septra 03-01-17 draining blood and pus  . Bronchitis   .  Chronic headache disorder 07/22/2016  . Clostridium difficile infection 04/20/2012  . Colitis   . Dehydration   . Depression   . Family history of adverse reaction to anesthesia    pt mom gets sick  . GERD (gastroesophageal reflux disease)    takes Pantoprazole daily  . Headache   . Hiatal hernia    neuropathy - mild in arms and legs  . History of blood transfusion    last transfusion was 04/04/2016=Benadryl was given d/t itching. States she always itches with transfusion.   Marland Kitchen History of bronchitis    > 2 yrs ago  . History of colon polyps    benign  . History of migraine    last one 05/01/16  . History of stomach ulcers   . History of urinary tract infection LAST 2 WEEKS AGO  . IDA (iron deficiency anemia)   . Internal and external bleeding hemorrhoids 06/11/2014  . Joint pain   . Joint swelling   . Left sided chronic colitis - segmental 06/11/2014  . Marginal ulcer 10/25/2017  . Migraine   . Motion sickness   . Nausea    takes Zofran as needed  . Nausea and vomiting    for 1 year  . Obesity   . Oligouria   . Osteoarthritis    lower back, knees, wrists - no meds  . Peripheral neuropathy 03/01/2019  . Pneumonia 1997  . Postoperative nausea and vomiting 01/21/2016   wants scopolamine patch  . Right carpal tunnel syndrome 03/01/2019  . SVD (spontaneous vaginal delivery)    x 4  . Tingling    BOTH LOWER EXTREMETIES ALL THE  TIME DUE TO RAPID WEIGHT LOSS  . TOBACCO USER 10/02/2009   Qualifier: Diagnosis of  By: Dimas Millin MD, Ellard Artis    . Transfusion history    last transfusion 6'16   . Tremor 08/31/2018  . UC (ulcerative colitis) ([place])    supposed to be taking Lialda and Bentyl but has been off since gastric sleeve  . Vertigo    doesn't take any meds    PAST SURGICAL HISTORY: Past Surgical History:  Procedure Laterality Date  . ABDOMINAL HYSTERECTOMY     PARTIAL  . COLONOSCOPY  2007   for rectal bleeding; [facility]  . COLONOSCOPY N/A 06/11/2014   Procedure: COLONOSCOPY;  Surgeon: Gatha Mayer, MD;  Location: [facility];  Service: Endoscopy;  Laterality: N/A;  . COLONOSCOPY WITH PROPOFOL N/A 03/02/2017   Procedure: COLONOSCOPY WITH PROPOFOL;  Surgeon: Alphonsa Overall, MD;  Location: [facility];  Service: General;  Laterality: N/A;  . DILATION AND CURETTAGE OF UTERUS    . ESOPHAGOGASTRODUODENOSCOPY (EGD) WITH PROPOFOL N/A 04/06/2016   Procedure: ESOPHAGOGASTRODUODENOSCOPY (EGD) WITH PROPOFOL;  Surgeon: Alphonsa Overall, MD;  [facility]: [facility];  Service: General;  Laterality: N/A;  . ESOPHAGOGASTRODUODENOSCOPY (EGD) WITH PROPOFOL N/A 03/02/2017   Procedure: ESOPHAGOGASTRODUODENOSCOPY (EGD) WITH PROPOFOL;  Surgeon: Alphonsa Overall, MD;  Location: [facility];  Service: General;  Laterality: N/A;  . ESOPHAGOGASTRODUODENOSCOPY (EGD) WITH PROPOFOL N/A 05/20/2017   Procedure: ESOPHAGOGASTRODUODENOSCOPY (EGD) WITH PROPOFOL ERAS PATHWAY;  Surgeon: Alphonsa Overall, MD;  Location: [facility];  Service: General;  Laterality: N/A;  . ESOPHAGOGASTRODUODENOSCOPY (EGD) WITH PROPOFOL N/A 10/07/2017   Procedure: ESOPHAGOGASTRODUODENOSCOPY (EGD) WITH PROPOFOL;  Surgeon: Alphonsa Overall, MD;  Location: [facility];  Service: General;  Laterality: N/A;  . EXCISION OF SKIN TAG  06/08/2017   Procedure: EXCISION OF VULVAR SKIN TAGS X2;  Surgeon: [name], MD;  Location: [facility];  Service: Gynecology;;  . FOOT SURGERY Bilateral    x 2  .  GASTRIC ROUX-EN-Y N/A 12/14/2016   Procedure: LAPAROSCOPIC REVISION SLEEVE GASTRECTOMY TO  ROUX-Y-GASTRIC BY-PASS, UPPER ENDO;  Surgeon: Excell Seltzer, MD;  Location: [facility];  Service: General;  Laterality: N/A;  . GASTROJEJUNOSTOMY N/A 05/11/2016   Procedure: LAPAROSCOPIC PLACEMENT  OF FEEDING  JEJUNOSTOMY TUBE;  Surgeon: Excell Seltzer, MD;  Location: [facility];  Service: General;  Laterality: N/A;  . HEMORRHOIDECTOMY WITH HEMORRHOID BANDING    . IR FLUORO GUIDE CV LINE RIGHT  04/06/2017  . IR GENERIC HISTORICAL  05/19/2016   IR CM INJ ANY COLONIC TUBE W/FLUORO 05/19/2016 Aletta Edouard, MD [facility]  . IR PATIENT EVAL TECH 0-60 MINS  11/30/2017  . IR REPLC DUODEN/JEJUNO TUBE PERCUT W/FLUORO  03/14/2018  . IR REPLC DUODEN/JEJUNO TUBE PERCUT W/FLUORO  03/15/2018  . IR US GUIDE VASC ACCESS RIGHT  04/06/2017  . j tube removed march 2018    . KNEE ARTHROSCOPY Left 09/06/2014  . LAPAROSCOPIC GASTRIC SLEEVE RESECTION N/A 01/14/2016   Procedure: LAPAROSCOPIC GASTRIC SLEEVE RESECTION;  Surgeon: Excell Seltzer, MD;  [facility]: [facility];  Service: General;  Laterality: N/A;  . LAPAROSCOPIC REVISION OF GASTROJEJUNOSTOMY N/A 10/25/2017   Procedure: LAPAROSCOPIC REVISION OF GASTROJEJUNOSTOMY AND PARTIAL GASTRECTOMY, WITH PLACEMENT OF FEEDING GASTROSTOMY TUBE;  Surgeon: Excell Seltzer, MD;  Location: [facility];  Service: General;  Laterality: N/A;  . LAPAROSCOPIC TUBAL LIGATION  10/16/2011   Procedure: LAPAROSCOPIC TUBAL LIGATION;  Surgeon: Alwyn Pea, MD;  Location: [facility];  Service: Gynecology;  Laterality: Bilateral;  . NOVASURE ABLATION  09/28/2010   mild persistent vaginal bleeding  . right knee arthroscopy     05/07/2016  . TOTAL KNEE ARTHROPLASTY Left 03/10/2018   Procedure: LEFT TOTAL KNEE ARTHROPLASTY;  Surgeon: Paralee Cancel, MD;  Location: [facility];  Service: Orthopedics;  Laterality: Left;  70 mins  . TOTAL KNEE ARTHROPLASTY Right 01/23/2020   Procedure: TOTAL KNEE ARTHROPLASTY, BILATERAL  TROCHANTERIC INJECTION;  Surgeon: Paralee Cancel, MD;  Location: [facility];  Service: Orthopedics;  Laterality: Right;  70 mins for Bilateral Troch injection 1 cc Depo and 2 CC Lidocaine  . TUBAL LIGATION    . VAGINAL HYSTERECTOMY Bilateral 06/08/2017   Procedure: HYSTERECTOMY VAGINAL W/ BILATERAL SALPINGECTOMY;  Surgeon: [name], MD;  Location: [facility];  Service: Gynecology;  Laterality: Bilateral;  . wisdom teeth extracted      FAMILY HISTORY: Family History  Problem Relation Age of Onset  . Hypertension Mother   . Diabetes Mother   . Sarcoidosis Mother        lungs and skin  . Asthma Mother   . Hypertension Father   . Diabetes Father   . Asthma Son   . Tics Son   . Asthma Sister   . Cancer Sister        possible pancreatic cancer  . Adrenal disorder Sister        Tumor   . Asthma Brother   . Asthma Daughter        died age 697.5  . Cancer Daughter 4       brain; died age 697.5  . Asthma Son   . Cancer Paternal Aunt        brain, colon, lung and esophagus; unsure of primary   . Breast cancer Paternal Aunt     SOCIAL HISTORY: Social History   Socioeconomic History  . Marital status: Single    Spouse name: Not on file  .  Number of children: 3  . Years of education: Some college  . Highest education level: Not on file  Occupational History  . Occupation: Production designer, theatre/television/film    Comment: Currently out on disability d/t surg  Tobacco Use  . Smoking status: Former Smoker    Packs/day: 0.50    Years: 20.00    Pack years: 10.00    Types: Cigarettes    Quit date: 03/17/2014    Years since quitting: 6.2  . Smokeless tobacco: Never Used  Vaping Use  . Vaping Use: Never used  Substance and Sexual Activity  . Alcohol use: Yes    Alcohol/week: 0.0 standard drinks  . Drug use: No  . Sexual activity: Not Currently    Birth control/protection: Surgical, Abstinence  Other Topics Concern  . Not on file  Social History Narrative   Marital status:  Single; not dating  seriously.      Children:  3 sons (54, 1, 15) whose father is deceased, 1 daughter (deceased age 62); no grandchildren      Lives:  With 2 sons      Employment:  Long term disability in 2018 due to PICC line for iv fluids; Corporate treasurer at [employer]      Tobacco: quit June 2015      Alcohol:  None      Drugs:  None      Exercise:  Leg lifts for knee strengthening; walking   1 caffeine beverage daily      Sexual activity:  Active; total partners = up there.  No STDs in past.        Right-handed   Social Determinants of Health   Financial Resource Strain:   . Difficulty of Paying Living Expenses: Not on file  Food Insecurity:   . Worried About Charity fundraiser in the Last Year: Not on file  . Ran Out of Food in the Last Year: Not on file  Transportation Needs:   . Lack of Transportation (Medical): Not on file  . Lack of Transportation (Non-Medical): Not on file  Physical Activity:   . Days of Exercise per Week: Not on file  . Minutes of Exercise per Session: Not on file  Stress:   . Feeling of Stress : Not on file  Social Connections:   . Frequency of Communication with Friends and Family: Not on file  . Frequency of Social Gatherings with Friends and Family: Not on file  . Attends Religious Services: Not on file  . Active Member of Clubs or Organizations: Not on file  . Attends Archivist Meetings: Not on file  . Marital Status: Not on file  Intimate Partner Violence:   . Fear of Current or Ex-Partner: Not on file  . Emotionally Abused: Not on file  . Physically Abused: Not on file  . Sexually Abused: Not on file   PHYSICAL EXAM  Vitals:   06/17/20 1042  BP: 113/72  Pulse: 89  Weight: 176 lb 3.2 oz (79.9 kg)  Height: _0  (1.676 m)   Body mass index is 28.44 kg/m.  Not willing for orthostatics.  Generalized: Well developed, in no acute distress   Neurological examination  Mentation: Alert oriented to time, place, history taking. Follows all  commands speech and language fluent Cranial nerve II-XII: Pupils were equal round reactive to light. Extraocular movements were full, visual field were full on confrontational test. Facial sensation and strength were normal. Head turning and shoulder shrug  were normal and symmetric. Motor: The motor testing reveals 5 over 5 strength of all 4 extremities. Good symmetric motor tone is noted throughout.  Sensory: Sensory testing is intact to soft touch on all 4 extremities. No evidence of extinction is noted.  Coordination: Cerebellar testing reveals good finger-nose-finger and heel-to-shin bilaterally.  Gait and station: Gait is normal Reflexes: Deep tendon reflexes are symmetric and normal bilaterally.   DIAGNOSTIC DATA (LABS, IMAGING, TESTING) - I reviewed patient records, labs, notes, testing and imaging myself where available.  Lab Results  Component Value Date   WBC 17.5 (H) 01/24/2020   HGB 10.5 (L) 01/24/2020   HCT 32.6 (L) 01/24/2020   MCV 92.9 01/24/2020   PLT 219 01/24/2020      Component Value Date/Time   NA 138 01/24/2020 0253   NA 141 11/21/2019 1523   K 4.6 01/24/2020 0253   CL 108 01/24/2020 0253   CO2 22 01/24/2020 0253   GLUCOSE 169 (H) 01/24/2020 0253   BUN 15 01/24/2020 0253   BUN 6 11/21/2019 1523   CREATININE 0.79 01/24/2020 0253   CREATININE 0.88 06/05/2016 1003   CALCIUM 8.8 (L) 01/24/2020 0253   PROT 6.2 11/21/2019 1523   ALBUMIN 3.8 11/21/2019 1523   AST 13 11/21/2019 1523   ALT 6 11/21/2019 1523   ALKPHOS 77 11/21/2019 1523   BILITOT <0.2 11/21/2019 1523   GFRNONAA >60 01/24/2020 0253   GFRNONAA 84 06/05/2016 1003   GFRAA >60 01/24/2020 0253   GFRAA >89 06/05/2016 1003   Lab Results  Component Value Date   CHOL 168 10/30/2017   HDL 60 10/30/2017   LDLCALC 88 10/30/2017   TRIG 98 10/30/2017   CHOLHDL 2.8 10/30/2017   Lab Results  Component Value Date   HGBA1C 5.1 07/26/2017   Lab Results  Component Value Date   HFWYOVZC58 850 01/30/2019    Lab Results  Component Value Date   TSH 1.140 01/26/2018      ASSESSMENT AND PLAN 44 y.o. year old female  has a past medical history of Anal fissure - posterior (10/16/2014), Anxiety, Asthma, Boil, Bronchitis, Chronic headache disorder (07/22/2016), Clostridium difficile infection (04/20/2012), Colitis, Dehydration, Depression, Family history of adverse reaction to anesthesia, GERD (gastroesophageal reflux disease), Headache, Hiatal hernia, History of blood transfusion, History of bronchitis, History of colon polyps, History of migraine, History of stomach ulcers, History of urinary tract infection (LAST 2 WEEKS AGO), IDA (iron deficiency anemia), Internal and external bleeding hemorrhoids (06/11/2014), Joint pain, Joint swelling, Left sided chronic colitis - segmental (06/11/2014), Marginal ulcer (10/25/2017), Migraine, Motion sickness, Nausea, Nausea and vomiting, Obesity, Oligouria, Osteoarthritis, Peripheral neuropathy (03/01/2019), Pneumonia (1997), Postoperative nausea and vomiting (01/21/2016), Right carpal tunnel syndrome (03/01/2019), SVD (spontaneous vaginal delivery), Tingling, TOBACCO USER (10/02/2009), Transfusion history, Tremor (08/31/2018), UC (ulcerative colitis) ([place]), and Vertigo. here with:  1.  Chronic daily headache, migraine 2.  Chronic neuromuscular discomfort, possible fibromyalgia 3.  History of chronic opiate use 4.  History of syncope  -Continue Botox therapy for chronic migraines, next 07/08/20 -Has been 50% beneficial (still 5-6 headaches weekly, but only 3 are significant, was having near daily significant migraine), will off Aimovig -Given rx for home blood cuff, recent syncope episode following diarrhea -Continue baclofen at bedtime for leg cramps/paresthesia -Follow-up in 6 months or sooner if needed  I spent 30 minutes of face-to-face and non-face-to-face time with patient.  This included previsit chart review, lab review, study review, order entry, electronic health  record documentation, patient education.  Butler Denmark,  AGNP-C, DNP 06/17/2020, 11:45 AM [facility] [address], [place], [place] [address] [phone]

## 2020-06-17 ENCOUNTER — Other Ambulatory Visit: Payer: Self-pay

## 2020-06-17 ENCOUNTER — Encounter: Payer: Self-pay | Admitting: Neurology

## 2020-06-17 ENCOUNTER — Ambulatory Visit (INDEPENDENT_AMBULATORY_CARE_PROVIDER_SITE_OTHER): Payer: 59 | Admitting: Neurology

## 2020-06-17 VITALS — BP 113/72 | HR 89 | Ht 66.0 in | Wt 176.2 lb

## 2020-06-17 DIAGNOSIS — R55 Syncope and collapse: Secondary | ICD-10-CM

## 2020-06-17 DIAGNOSIS — G43711 Chronic migraine without aura, intractable, with status migrainosus: Secondary | ICD-10-CM | POA: Diagnosis not present

## 2020-06-17 NOTE — Patient Instructions (Signed)
Keep appointment for Botox injection in a few weeks Rx given for BP cuff  I hope your headaches will continue to improve See you back in 6 months

## 2020-06-18 NOTE — Progress Notes (Signed)
I have read the note, and I agree with the clinical assessment and plan.  Celia Friedland K Ezequiel Macauley   

## 2020-06-20 ENCOUNTER — Encounter: Payer: Self-pay | Admitting: Neurology

## 2020-06-24 ENCOUNTER — Telehealth: Payer: Self-pay | Admitting: Neurology

## 2020-06-24 NOTE — Telephone Encounter (Signed)
I received a form to fill out from [employer] concerning disability.  The patient apparently has been out on disability since 2016.  She apparently went out for problems with her knee but has remained on disability because of chronic neck and low back pain.  The patient is treated through this office for migraine headaches.

## 2020-06-26 ENCOUNTER — Telehealth: Payer: Self-pay | Admitting: Neurology

## 2020-06-26 NOTE — Telephone Encounter (Signed)
Paperwork faxed to [place] [phone] 06/26/2020.

## 2020-07-04 ENCOUNTER — Telehealth: Payer: Self-pay | Admitting: Neurology

## 2020-07-04 NOTE — Telephone Encounter (Signed)
Patient has a Botox appointment on 10/11. I called [employer] and spoke with Nechama Guard to see if codes [phone] and [phone] require PA. She states no PA is required. Reference #[id number].   (1) 200U vial of Botox delivered today from [facility].

## 2020-07-08 ENCOUNTER — Ambulatory Visit (INDEPENDENT_AMBULATORY_CARE_PROVIDER_SITE_OTHER): Payer: 59 | Admitting: Neurology

## 2020-07-08 ENCOUNTER — Other Ambulatory Visit: Payer: Self-pay

## 2020-07-08 ENCOUNTER — Encounter: Payer: Self-pay | Admitting: Neurology

## 2020-07-08 VITALS — BP 119/83 | HR 76 | Ht 67.0 in | Wt 183.0 lb

## 2020-07-08 DIAGNOSIS — G43711 Chronic migraine without aura, intractable, with status migrainosus: Secondary | ICD-10-CM

## 2020-07-08 NOTE — Progress Notes (Signed)
Please refer to Botox procedure note.

## 2020-07-08 NOTE — Procedures (Signed)
     BOTOX PROCEDURE NOTE FOR MIGRAINE HEADACHE   HISTORY: [name] is a 44 year old patient with a history of intractable migraine headache.  The patient is having daily headaches.  She received her first Botox injection in July 2021, she had a significant reduction in headache severity without reduction in headache frequency.  She was able to function quite well with a lower intensity headache, but in the last 2 weeks the headache is returned to full severity.  The patient comes in for her second treatment.  She clearly believes that the Botox therapy was beneficial.   Description of procedure:  The patient was placed in a sitting position. The standard protocol was used for Botox as follows, with 5 units of Botox injected at each site:   -Procerus muscle, midline injection  -Corrugator muscle, bilateral injection  -Frontalis muscle, bilateral injection, with 2 sites each side, medial injection was performed in the upper one third of the frontalis muscle, in the region vertical from the medial inferior edge of the superior orbital rim. The lateral injection was again in the upper one third of the forehead vertically above the lateral limbus of the cornea, 1.5 cm lateral to the medial injection site.  -Temporalis muscle injection, 4 sites, bilaterally. The first injection was 3 cm above the tragus of the ear, second injection site was 1.5 cm to 3 cm up from the first injection site in line with the tragus of the ear. The third injection site was 1.5-3 cm forward between the first 2 injection sites. The fourth injection site was 1.5 cm posterior to the second injection site.  -Occipitalis muscle injection, 3 sites, bilaterally. The first injection was done one half way between the occipital protuberance and the tip of the mastoid process behind the ear. The second injection site was done lateral and superior to the first, 1 fingerbreadth from the first injection. The third injection site  was 1 fingerbreadth superiorly and medially from the first injection site.  -Cervical paraspinal muscle injection, 2 sites, bilateral, the first injection site was 1 cm from the midline of the cervical spine, 3 cm inferior to the lower border of the occipital protuberance. The second injection site was 1.5 cm superiorly and laterally to the first injection site.  -Trapezius muscle injection was performed at 3 sites, bilaterally. The first injection site was in the upper trapezius muscle halfway between the inflection point of the neck, and the acromion. The second injection site was one half way between the acromion and the first injection site. The third injection was done between the first injection site and the inflection point of the neck.   A 200 unit bottle of Botox was used, 155 units were injected, the rest of the Botox was wasted. The patient tolerated the procedure well, there were no complications of the above procedure.  Botox NDC 7989-2119-41 Lot number [id number] Expiration date May 2024

## 2020-07-11 DIAGNOSIS — Z0289 Encounter for other administrative examinations: Secondary | ICD-10-CM

## 2020-07-22 ENCOUNTER — Encounter: Payer: Self-pay | Admitting: Plastic Surgery

## 2020-07-22 ENCOUNTER — Ambulatory Visit (INDEPENDENT_AMBULATORY_CARE_PROVIDER_SITE_OTHER): Payer: 59 | Admitting: Plastic Surgery

## 2020-07-22 ENCOUNTER — Other Ambulatory Visit: Payer: Self-pay

## 2020-07-22 VITALS — BP 124/83 | HR 84 | Temp 98.4°F | Ht 66.75 in | Wt 183.0 lb

## 2020-07-22 DIAGNOSIS — Z411 Encounter for cosmetic surgery: Secondary | ICD-10-CM

## 2020-07-22 DIAGNOSIS — M793 Panniculitis, unspecified: Secondary | ICD-10-CM

## 2020-07-22 NOTE — Progress Notes (Signed)
Referring Provider Beverley Fiedler, [place],  [place] [id number]   CC:  Chief Complaint  Patient presents with  . Advice Only      [name] is an 44 y.o. female.  HPI: Patient presents to discuss panniculectomy.  She is bothered by a large overhanging pannus.  She gets intermittent skin irritation in the crease that has become infected before.  She is tried over-the-counter and prescription treatments with no sustained relief.  She has lost about 200 pounds through 2 bariatric procedures.  She had a laparoscopic sleeve in 2017 followed by bypass in 2018.  At one point she ended up with a feeding tube because she was losing losing weight too quickly.  She has been at her current weight for at least 6 months at this point.  Allergies  Allergen Reactions  . Coconut Oil Anaphylaxis and Itching  . Morphine And Related Anaphylaxis and Hives    Pt states that she has tolerated Norco and Dilaudid  . Oxycodone Anaphylaxis  . Ciprofloxacin Itching and Rash  . Trazodone And Nefazodone Rash  . Vistaril [Hydroxyzine Hcl] Hives  . Doxycycline Nausea And Vomiting  . Adhesive [Tape] Rash    And paper tape causes a rash if wearing for a prolong period of time  . Penicillins Rash    Has patient had a PCN reaction causing immediate rash, facial/tongue/throat swelling, SOB or lightheadedness with hypotension: Yes Has patient had a PCN reaction causing severe rash involving mucus membranes or skin necrosis: No Has patient had a PCN reaction that required hospitalization No Has patient had a PCN reaction occurring within the last 10 years: No If all of the above answers are "NO", then may proceed with Cephalosporin use.    Outpatient Encounter Medications as of 07/22/2020  Medication Sig  . albuterol (VENTOLIN HFA) 108 (90 Base) MCG/ACT inhaler Inhale 2 puffs into the lungs every 6 (six) hours as needed for wheezing or shortness of breath.  . baclofen (LIORESAL) 10 MG  tablet Take 1 tablet (10 mg total) by mouth at bedtime.  . Botulinum Toxin Type A (BOTOX) 200 units SOLR Provider to inject 155 units into the muscles of head and neck every 3 months. Discard remainder.  Marland Kitchen buPROPion (WELLBUTRIN SR) 150 MG 12 hr tablet TAKE 1 TABLET(S) BY MOUTH EVERY MORNING FOR DEPRESSION  . buPROPion (WELLBUTRIN XL) 300 MG 24 hr tablet Take 300 mg by mouth every morning.  Marland Kitchen buPROPion (ZYBAN) 150 MG 12 hr tablet bupropion HCl 150 mg tablet,12 hr sustained-release(smoking deterrent)  Take 1 tablet twice a day by oral route.  . busPIRone (BUSPAR) 10 MG tablet Take 10 mg by mouth 3 (three) times daily.  . celecoxib (CELEBREX) 200 MG capsule Take 1 capsule (200 mg total) by mouth 2 (two) times daily.  . Cholecalciferol (VITAMIN D3) 1.25 MG (50000 UT) CAPS Take 50,000 Units by mouth every 7 (seven) days.   . cyanocobalamin (,VITAMIN B-12,) 1000 MCG/ML injection Inject 1,000 mcg into the muscle 2 (two) times a week.   . diclofenac Sodium (VOLTAREN) 1 % GEL Apply topically 4 (four) times daily.  . DULoxetine (CYMBALTA) 60 MG capsule Take 1 capsule (60 mg total) by mouth 2 (two) times daily.  . ferrous sulfate 325 (65 FE) MG tablet Take 1 tablet (325 mg total) by mouth 2 (two) times daily with a meal.  . fluticasone (FLONASE) 50 MCG/ACT nasal spray Place 2 sprays into both nostrils daily as needed for allergies  or rhinitis.  Marland Kitchen HYDROmorphone (DILAUDID) 4 MG tablet Take 1-2 tablets (4-8 mg total) by mouth every 4 (four) hours as needed for moderate pain or severe pain. (Patient taking differently: Take 1-2 tablets (4-8 mg total) by mouth every 3 hours as needed for moderate pain or severe pain.)  . linaclotide (LINZESS) 290 MCG CAPS capsule Take 290 mcg by mouth daily before breakfast.  . mesalamine (LIALDA) 1.2 g EC tablet Lialda 1.2 gram tablet,delayed release  . methocarbamol (ROBAXIN) 500 MG tablet Take 500 mg by mouth every 8 (eight) hours as needed for muscle spasms.   . metoCLOPramide  (REGLAN) 10 MG tablet TAKE 1 TABLET (10 MG TOTAL) BY MOUTH 3 (THREE) TIMES DAILY BEFORE MEALS.  . Multiple Vitamin (MULTIVITAMIN WITH MINERALS) TABS tablet Take 1 tablet by mouth daily.  Marland Kitchen NARCAN 4 MG/0.1ML LIQD nasal spray kit 1 spray as directed.  . ondansetron (ZOFRAN) 8 MG tablet Take 8 mg by mouth every 8 (eight) hours as needed for nausea or vomiting.  . pantoprazole (PROTONIX) 40 MG tablet TAKE 1 TABLET BY MOUTH TWICE A DAY BEFORE A MEAL  . sucralfate (CARAFATE) 1 g tablet Take 2 g by mouth 4 (four) times daily.   Marland Kitchen thiamine (VITAMIN B-1) 100 MG tablet Take 100 mg by mouth daily at 12 noon.   Marland Kitchen acetaminophen (TYLENOL) 500 MG tablet Take 2 tablets (1,000 mg total) by mouth every 6 (six) hours as needed.   No facility-administered encounter medications on file as of 07/22/2020.     Past Medical History:  Diagnosis Date  . Anal fissure - posterior 10/16/2014    OCC  ISSUES  . Anxiety    doesn't take anything  . Asthma    has Albuterol inhaler as needed  . Boil    on pubic area started septra 03-01-17 draining blood and pus  . Bronchitis   . Chronic headache disorder 07/22/2016  . Clostridium difficile infection 04/20/2012  . Colitis   . Dehydration   . Depression   . Family history of adverse reaction to anesthesia    pt mom gets sick  . GERD (gastroesophageal reflux disease)    takes Pantoprazole daily  . Headache   . Hiatal hernia    neuropathy - mild in arms and legs  . History of blood transfusion    last transfusion was 04/04/2016=Benadryl was given d/t itching. States she always itches with transfusion.   Marland Kitchen History of bronchitis    > 2 yrs ago  . History of colon polyps    benign  . History of migraine    last one 05/01/16  . History of stomach ulcers   . History of urinary tract infection LAST 2 WEEKS AGO  . IDA (iron deficiency anemia)   . Internal and external bleeding hemorrhoids 06/11/2014  . Joint pain   . Joint swelling   . Left sided chronic colitis -  segmental 06/11/2014  . Marginal ulcer 10/25/2017  . Migraine   . Motion sickness   . Nausea    takes Zofran as needed  . Nausea and vomiting    for 1 year  . Obesity   . Oligouria   . Osteoarthritis    lower back, knees, wrists - no meds  . Peripheral neuropathy 03/01/2019  . Pneumonia 1997  . Postoperative nausea and vomiting 01/21/2016   wants scopolamine patch  . Right carpal tunnel syndrome 03/01/2019  . SVD (spontaneous vaginal delivery)    x 4  . Tingling  BOTH LOWER EXTREMETIES ALL THE TIME DUE TO RAPID WEIGHT LOSS  . TOBACCO USER 10/02/2009   Qualifier: Diagnosis of  By: Dimas Millin MD, Ellard Artis    . Transfusion history    last transfusion 6'16   . Tremor 08/31/2018  . UC (ulcerative colitis) ([place])    supposed to be taking Lialda and Bentyl but has been off since gastric sleeve  . Vertigo    doesn't take any meds    Past Surgical History:  Procedure Laterality Date  . ABDOMINAL HYSTERECTOMY     PARTIAL  . COLONOSCOPY  2007   for rectal bleeding; [facility]  . COLONOSCOPY N/A 06/11/2014   Procedure: COLONOSCOPY;  Surgeon: Gatha Mayer, MD;  Location: [facility];  Service: Endoscopy;  Laterality: N/A;  . COLONOSCOPY WITH PROPOFOL N/A 03/02/2017   Procedure: COLONOSCOPY WITH PROPOFOL;  Surgeon: Alphonsa Overall, MD;  Location: [facility];  Service: General;  Laterality: N/A;  . DILATION AND CURETTAGE OF UTERUS    . ESOPHAGOGASTRODUODENOSCOPY (EGD) WITH PROPOFOL N/A 04/06/2016   Procedure: ESOPHAGOGASTRODUODENOSCOPY (EGD) WITH PROPOFOL;  Surgeon: Alphonsa Overall, MD;  Location: [facility];  Service: General;  Laterality: N/A;  . ESOPHAGOGASTRODUODENOSCOPY (EGD) WITH PROPOFOL N/A 03/02/2017   Procedure: ESOPHAGOGASTRODUODENOSCOPY (EGD) WITH PROPOFOL;  Surgeon: Alphonsa Overall, MD;  Location: [facility];  Service: General;  Laterality: N/A;  . ESOPHAGOGASTRODUODENOSCOPY (EGD) WITH PROPOFOL N/A 05/20/2017   Procedure: ESOPHAGOGASTRODUODENOSCOPY (EGD) WITH PROPOFOL ERAS PATHWAY;  Surgeon:  Alphonsa Overall, MD;  Location: [facility];  Service: General;  Laterality: N/A;  . ESOPHAGOGASTRODUODENOSCOPY (EGD) WITH PROPOFOL N/A 10/07/2017   Procedure: ESOPHAGOGASTRODUODENOSCOPY (EGD) WITH PROPOFOL;  Surgeon: Alphonsa Overall, MD;  Location: [facility];  Service: General;  Laterality: N/A;  . EXCISION OF SKIN TAG  06/08/2017   Procedure: EXCISION OF VULVAR SKIN TAGS X2;  Surgeon: [name], MD;  Location: [facility];  Service: Gynecology;;  . FOOT SURGERY Bilateral    x 2  . GASTRIC ROUX-EN-Y N/A 12/14/2016   Procedure: LAPAROSCOPIC REVISION SLEEVE GASTRECTOMY TO  ROUX-Y-GASTRIC BY-PASS, UPPER ENDO;  Surgeon: Excell Seltzer, MD;  Location: [facility];  Service: General;  Laterality: N/A;  . GASTROJEJUNOSTOMY N/A 05/11/2016   Procedure: LAPAROSCOPIC PLACEMENT  OF FEEDING  JEJUNOSTOMY TUBE;  Surgeon: Excell Seltzer, MD;  Location: [facility];  Service: General;  Laterality: N/A;  . HEMORRHOIDECTOMY WITH HEMORRHOID BANDING    . IR FLUORO GUIDE CV LINE RIGHT  04/06/2017  . IR GENERIC HISTORICAL  05/19/2016   IR CM INJ ANY COLONIC TUBE W/FLUORO 05/19/2016 Aletta Edouard, MD [facility]  . IR PATIENT EVAL TECH 0-60 MINS  11/30/2017  . IR REPLC DUODEN/JEJUNO TUBE PERCUT W/FLUORO  03/14/2018  . IR REPLC DUODEN/JEJUNO TUBE PERCUT W/FLUORO  03/15/2018  . IR US GUIDE VASC ACCESS RIGHT  04/06/2017  . j tube removed march 2018    . KNEE ARTHROSCOPY Left 09/06/2014  . LAPAROSCOPIC GASTRIC SLEEVE RESECTION N/A 01/14/2016   Procedure: LAPAROSCOPIC GASTRIC SLEEVE RESECTION;  Surgeon: Excell Seltzer, MD;  Location: [facility];  Service: General;  Laterality: N/A;  . LAPAROSCOPIC REVISION OF GASTROJEJUNOSTOMY N/A 10/25/2017   Procedure: LAPAROSCOPIC REVISION OF GASTROJEJUNOSTOMY AND PARTIAL GASTRECTOMY, WITH PLACEMENT OF FEEDING GASTROSTOMY TUBE;  Surgeon: Excell Seltzer, MD;  Location: [facility];  Service: General;  Laterality: N/A;  . LAPAROSCOPIC TUBAL LIGATION  10/16/2011   Procedure: LAPAROSCOPIC TUBAL  LIGATION;  Surgeon: Alwyn Pea, MD;  Location: [facility];  Service: Gynecology;  Laterality: Bilateral;  . NOVASURE ABLATION  09/28/2010   mild  persistent vaginal bleeding  . right knee arthroscopy     05/07/2016  . TOTAL KNEE ARTHROPLASTY Left 03/10/2018   Procedure: LEFT TOTAL KNEE ARTHROPLASTY;  Surgeon: Paralee Cancel, MD;  Location: [facility];  Service: Orthopedics;  Laterality: Left;  70 mins  . TOTAL KNEE ARTHROPLASTY Right 01/23/2020   Procedure: TOTAL KNEE ARTHROPLASTY, BILATERAL TROCHANTERIC INJECTION;  Surgeon: Paralee Cancel, MD;  Location: [facility];  Service: Orthopedics;  Laterality: Right;  70 mins for Bilateral Troch injection 1 cc Depo and 2 CC Lidocaine  . TUBAL LIGATION    . VAGINAL HYSTERECTOMY Bilateral 06/08/2017   Procedure: HYSTERECTOMY VAGINAL W/ BILATERAL SALPINGECTOMY;  Surgeon: [name], MD;  Location: [facility];  Service: Gynecology;  Laterality: Bilateral;  . wisdom teeth extracted      Family History  Problem Relation Age of Onset  . Hypertension Mother   . Diabetes Mother   . Sarcoidosis Mother        lungs and skin  . Asthma Mother   . Hypertension Father   . Diabetes Father   . Asthma Son   . Tics Son   . Asthma Sister   . Cancer Sister        possible pancreatic cancer  . Adrenal disorder Sister        Tumor   . Asthma Brother   . Asthma Daughter        died age 54.5  . Cancer Daughter 4       brain; died age 54.5  . Asthma Son   . Cancer Paternal Aunt        brain, colon, lung and esophagus; unsure of primary   . Breast cancer Paternal Aunt     Social History   Social History Narrative   Marital status:  Single; not dating seriously.      Children:  3 sons (70, 76, 46) whose father is deceased, 1 daughter (deceased age 81); no grandchildren      Lives:  With 2 sons      Employment:  Long term disability in 2018 due to PICC line for iv fluids; Corporate treasurer at [employer]      Tobacco: quit June 2015      Alcohol:  None      Drugs:   None      Exercise:  Leg lifts for knee strengthening; walking   1 caffeine beverage daily      Sexual activity:  Active; total partners = up there.  No STDs in past.        Right-handed  Denies current tobacco use  Review of Systems General: Denies fevers, chills, weight loss CV: Denies chest pain, shortness of breath, palpitations  Physical Exam Vitals with BMI 07/22/2020 07/08/2020 06/17/2020  Height 5' 6.75" 5' 7"  5' 6"   Weight 183 lbs 183 lbs 176 lbs 3 oz  BMI 28.89 38.18 29.93  Systolic 716 967 893  Diastolic 83 83 72  Pulse 84 76 89  Some encounter information is confidential and restricted. Go to Review Flowsheets activity to see all data.    General:  No acute distress,  Alert and oriented, Non-Toxic, Normal speech and affect Abdomen: Abdomen is soft nontender.  She has significant overhanging pannus with little adipose tissue.  Its mostly skin.  She has several laparoscopic port sites scars.  She has a few heavily indented scars in the upper abdomen from her feeding tubes.  I cannot detect any obvious hernias on exam.  She  has a bit of a upper abdominal bulge and what I believe this palpable rectus diastases.  Assessment/Plan Patient is a good candidate for infraumbilical panniculectomy.  I explained that she would also benefit from a plication and liposuction on the sides if she was interested.  This did appeal to her and she is interested in having this cosmetic add-on to her procedure.  We discussed the procedure in detail.  We discussed the risks include bleeding, infection, damage to surrounding structures need for additional procedures.  I explained I would like to get a CT scan to look for any evidence of hernia from all of her prior abdominal operations.  I explained the location in the orientation of the scar and the expected recovery process.  I explained the need for drains and the need for compression garments postoperatively.  I explained that I would try to excise the  dented and scars in the upper abdomen at the same time of the procedure but they may need to be done in a staged fashion after she is healed.  She is understanding of this and is interested in moving forward.  Cindra Presume 07/22/2020, 6:33 PM

## 2020-07-26 ENCOUNTER — Other Ambulatory Visit: Payer: Self-pay

## 2020-08-01 ENCOUNTER — Other Ambulatory Visit: Payer: Self-pay | Admitting: Neurology

## 2020-08-29 ENCOUNTER — Other Ambulatory Visit: Payer: Self-pay | Admitting: Neurology

## 2020-09-17 DIAGNOSIS — Z0289 Encounter for other administrative examinations: Secondary | ICD-10-CM

## 2020-10-03 ENCOUNTER — Encounter: Payer: Self-pay | Admitting: Neurology

## 2020-10-03 ENCOUNTER — Ambulatory Visit (INDEPENDENT_AMBULATORY_CARE_PROVIDER_SITE_OTHER): Payer: 59 | Admitting: Neurology

## 2020-10-03 VITALS — BP 135/90 | HR 87 | Ht 67.0 in | Wt 194.0 lb

## 2020-10-03 DIAGNOSIS — G43711 Chronic migraine without aura, intractable, with status migrainosus: Secondary | ICD-10-CM

## 2020-10-03 MED ORDER — OXYBUTYNIN CHLORIDE 5 MG PO TABS: 5.0000 mg | ORAL_TABLET | Freq: Two times a day (BID) | ORAL | 3 refills | Status: DC

## 2020-10-03 NOTE — Progress Notes (Signed)
The patient indicates that she recently has had some troubles with urinary urgency and incontinence.  She has had a urinalysis that did not show evidence of bladder infection.  I will place her on oxybutynin 5 mg twice daily, if this is not effective she will contact our office and we will make a referral to urology.

## 2020-10-03 NOTE — Procedures (Signed)
     BOTOX PROCEDURE NOTE FOR MIGRAINE HEADACHE   HISTORY: [name] is a 45 year old patient with a history of intractable migraine headache.  She gets excellent benefit with the Botox, but the headaches returned within about 2 or 2 and half weeks prior to the next injection.  She clearly believes that the Botox therapy is effective.   Description of procedure:  The patient was placed in a sitting position. The standard protocol was used for Botox as follows, with 5 units of Botox injected at each site:   -Procerus muscle, midline injection  -Corrugator muscle, bilateral injection  -Frontalis muscle, bilateral injection, with 2 sites each side, medial injection was performed in the upper one third of the frontalis muscle, in the region vertical from the medial inferior edge of the superior orbital rim. The lateral injection was again in the upper one third of the forehead vertically above the lateral limbus of the cornea, 1.5 cm lateral to the medial injection site.  -Temporalis muscle injection, 4 sites, bilaterally. The first injection was 3 cm above the tragus of the ear, second injection site was 1.5 cm to 3 cm up from the first injection site in line with the tragus of the ear. The third injection site was 1.5-3 cm forward between the first 2 injection sites. The fourth injection site was 1.5 cm posterior to the second injection site.  -Occipitalis muscle injection, 3 sites, bilaterally. The first injection was done one half way between the occipital protuberance and the tip of the mastoid process behind the ear. The second injection site was done lateral and superior to the first, 1 fingerbreadth from the first injection. The third injection site was 1 fingerbreadth superiorly and medially from the first injection site.  -Cervical paraspinal muscle injection, 2 sites, bilateral, the first injection site was 1 cm from the midline of the cervical spine, 3 cm inferior to the lower  border of the occipital protuberance. The second injection site was 1.5 cm superiorly and laterally to the first injection site.  -Trapezius muscle injection was performed at 3 sites, bilaterally. The first injection site was in the upper trapezius muscle halfway between the inflection point of the neck, and the acromion. The second injection site was one half way between the acromion and the first injection site. The third injection was done between the first injection site and the inflection point of the neck.   A 200 unit bottle of Botox was used, 155 units were injected, the rest of the Botox was wasted. The patient tolerated the procedure well, there were no complications of the above procedure.  Botox NDC 6384-6659-93 Lot number [id number] Expiration date October 2024

## 2020-11-06 ENCOUNTER — Other Ambulatory Visit: Payer: Self-pay | Admitting: Plastic Surgery

## 2020-11-06 DIAGNOSIS — R1013 Epigastric pain: Secondary | ICD-10-CM

## 2020-11-12 DIAGNOSIS — Z0289 Encounter for other administrative examinations: Secondary | ICD-10-CM

## 2020-11-27 ENCOUNTER — Emergency Department (HOSPITAL_COMMUNITY)
Admission: EM | Admit: 2020-11-27 | Discharge: 2020-11-27 | Disposition: A | Discharge status: Home / Self Care | Payer: 59 | Attending: Emergency Medicine | Admitting: Emergency Medicine

## 2020-11-27 ENCOUNTER — Other Ambulatory Visit: Payer: Self-pay

## 2020-11-27 ENCOUNTER — Emergency Department (HOSPITAL_COMMUNITY): Payer: 59

## 2020-11-27 DIAGNOSIS — J45909 Unspecified asthma, uncomplicated: Secondary | ICD-10-CM | POA: Diagnosis not present

## 2020-11-27 DIAGNOSIS — Z96653 Presence of artificial knee joint, bilateral: Secondary | ICD-10-CM | POA: Diagnosis not present

## 2020-11-27 DIAGNOSIS — S0990XA Unspecified injury of head, initial encounter: Secondary | ICD-10-CM | POA: Diagnosis present

## 2020-11-27 DIAGNOSIS — W208XXA Other cause of strike by thrown, projected or falling object, initial encounter: Secondary | ICD-10-CM | POA: Diagnosis not present

## 2020-11-27 DIAGNOSIS — Z87891 Personal history of nicotine dependence: Secondary | ICD-10-CM | POA: Insufficient documentation

## 2020-11-27 DIAGNOSIS — M542 Cervicalgia: Secondary | ICD-10-CM | POA: Insufficient documentation

## 2020-11-27 DIAGNOSIS — S161XXA Strain of muscle, fascia and tendon at neck level, initial encounter: Secondary | ICD-10-CM

## 2020-11-27 MED ORDER — LORAZEPAM 2 MG/ML IJ SOLN
0.5000 mg | Freq: Once | INTRAMUSCULAR | Status: AC
  Administered 2020-11-27: 0.5 mg via INTRAVENOUS
  Filled 2020-11-27: qty 1

## 2020-11-27 MED ORDER — IBUPROFEN 600 MG PO TABS: 600.0000 mg | ORAL_TABLET | Freq: Four times a day (QID) | ORAL | 0 refills | Status: DC | PRN

## 2020-11-27 MED ORDER — METOCLOPRAMIDE HCL 5 MG/ML IJ SOLN
5.0000 mg | Freq: Once | INTRAMUSCULAR | Status: AC
  Administered 2020-11-27: 5 mg via INTRAVENOUS
  Filled 2020-11-27: qty 2

## 2020-11-27 NOTE — ED Provider Notes (Signed)
[facility] DEPT Provider Note   CSN: [id number] Arrival date & time: 11/27/20  1048     History Chief Complaint  Patient presents with  . Fall    [name] is a 45 y.o. female.  45 year old female presents with head neck pain after mechanical injury yesterday.  Struck her head neck against a wall after a bed fell on top of her.  Had loss of consciousness for approximately 3 minutes.  Awoke with severe dull pain to her head along with some pain to her lower neck.  No weakness in her arms or legs.  No visual changes.  Has had nausea and vomiting.  Symptoms persistent no treatment use prior to arrival        Past Medical History:  Diagnosis Date  . Anal fissure - posterior 10/16/2014    OCC  ISSUES  . Anxiety    doesn't take anything  . Asthma    has Albuterol inhaler as needed  . Boil    on pubic area started septra 03-01-17 draining blood and pus  . Bronchitis   . Chronic headache disorder 07/22/2016  . Clostridium difficile infection 04/20/2012  . Colitis   . Dehydration   . Depression   . Family history of adverse reaction to anesthesia    pt mom gets sick  . GERD (gastroesophageal reflux disease)    takes Pantoprazole daily  . Headache   . Hiatal hernia    neuropathy - mild in arms and legs  . History of blood transfusion    last transfusion was 04/04/2016=Benadryl was given d/t itching. States she always itches with transfusion.   Marland Kitchen History of bronchitis    > 2 yrs ago  . History of colon polyps    benign  . History of migraine    last one 05/01/16  . History of stomach ulcers   . History of urinary tract infection LAST 2 WEEKS AGO  . IDA (iron deficiency anemia)   . Internal and external bleeding hemorrhoids 06/11/2014  . Joint pain   . Joint swelling   . Left sided chronic colitis - segmental 06/11/2014  . Marginal ulcer 10/25/2017  . Migraine   . Motion sickness   . Nausea    takes Zofran as needed  . Nausea and  vomiting    for 1 year  . Obesity   . Oligouria   . Osteoarthritis    lower back, knees, wrists - no meds  . Peripheral neuropathy 03/01/2019  . Pneumonia 1997  . Postoperative nausea and vomiting 01/21/2016   wants scopolamine patch  . Right carpal tunnel syndrome 03/01/2019  . SVD (spontaneous vaginal delivery)    x 4  . Tingling    BOTH LOWER EXTREMETIES ALL THE TIME DUE TO RAPID WEIGHT LOSS  . TOBACCO USER 10/02/2009   Qualifier: Diagnosis of  By: Dimas Millin MD, Ellard Artis    . Transfusion history    last transfusion 6'16   . Tremor 08/31/2018  . UC (ulcerative colitis) ([place])    supposed to be taking Lialda and Bentyl but has been off since gastric sleeve  . Vertigo    doesn't take any meds    Patient Active Problem List   Diagnosis Date Noted  . Yeast infection 02/07/2020  . History of urinary urgency 02/07/2020  . Urinary frequency 02/07/2020  . Dysuria 02/07/2020  . Chronic migraine without aura, with intractable migraine, so stated, with status migrainosus 02/05/2020  . S/P right TKA  01/23/2020  . Status post total knee replacement, right 01/23/2020  . 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  . Chronic low back pain 10/25/2019  . Headache 05/04/2019  . Gait instability 05/04/2019  . Suicidal ideations   . MDD (major depressive disorder), recurrent severe, without psychosis ([place]) 04/08/2019  . Peripheral neuropathy 03/01/2019  . Right carpal tunnel syndrome 03/01/2019  . Chronic constipation 01/26/2019  . 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 B12 deficiency 12/01/2017  . Malnutrition, calorie ([place]) 03/27/2017  . Psychophysiological insomnia 03/18/2017  . Chronic headache disorder 07/22/2016  . Memory difficulty 07/22/2016  . 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 Anemia, iron deficiency 10/01/2015  . Left sided chronic colitis - segmental 06/11/2014  . Asthma 04/26/2012  . Reactive depression 10/02/2009    Past Surgical History:  Procedure Laterality Date  . ABDOMINAL  HYSTERECTOMY     PARTIAL  . COLONOSCOPY  2007   for rectal bleeding; [facility]  . COLONOSCOPY N/A 06/11/2014   Procedure: COLONOSCOPY;  Surgeon: Gatha Mayer, MD;  Location: [facility];  Service: Endoscopy;  Laterality: N/A;  . COLONOSCOPY WITH PROPOFOL N/A 03/02/2017   Procedure: COLONOSCOPY WITH PROPOFOL;  Surgeon: Alphonsa Overall, MD;  Location: [facility];  Service: General;  Laterality: N/A;  . DILATION AND CURETTAGE OF UTERUS    . ESOPHAGOGASTRODUODENOSCOPY (EGD) WITH PROPOFOL N/A 04/06/2016   Procedure: ESOPHAGOGASTRODUODENOSCOPY (EGD) WITH PROPOFOL;  Surgeon: Alphonsa Overall, MD;  Location: [facility];  Service: General;  Laterality: N/A;  . ESOPHAGOGASTRODUODENOSCOPY (EGD) WITH PROPOFOL N/A 03/02/2017   Procedure: ESOPHAGOGASTRODUODENOSCOPY (EGD) WITH PROPOFOL;  Surgeon: Alphonsa Overall, MD;  Location: [facility];  Service: General;  Laterality: N/A;  . ESOPHAGOGASTRODUODENOSCOPY (EGD) WITH PROPOFOL N/A 05/20/2017   Procedure: ESOPHAGOGASTRODUODENOSCOPY (EGD) WITH PROPOFOL ERAS PATHWAY;  Surgeon: Alphonsa Overall, MD;  Location: [facility];  Service: General;  Laterality: N/A;  . ESOPHAGOGASTRODUODENOSCOPY (EGD) WITH PROPOFOL N/A 10/07/2017   Procedure: ESOPHAGOGASTRODUODENOSCOPY (EGD) WITH PROPOFOL;  Surgeon: Alphonsa Overall, MD;  Location: [facility];  Service: General;  Laterality: N/A;  . EXCISION OF SKIN TAG  06/08/2017   Procedure: EXCISION OF VULVAR SKIN TAGS X2;  Surgeon: [name], MD;  Location: [facility];  Service: Gynecology;;  . FOOT SURGERY Bilateral    x 2  . GASTRIC ROUX-EN-Y N/A 12/14/2016   Procedure: LAPAROSCOPIC REVISION SLEEVE GASTRECTOMY TO  ROUX-Y-GASTRIC BY-PASS, UPPER ENDO;  Surgeon: Excell Seltzer, MD;  Location: [facility];  Service: General;  Laterality: N/A;  . GASTROJEJUNOSTOMY N/A 05/11/2016   Procedure: LAPAROSCOPIC PLACEMENT  OF FEEDING  JEJUNOSTOMY TUBE;  Surgeon: Excell Seltzer, MD;  Location: [facility];  Service: General;  Laterality: N/A;  .  HEMORRHOIDECTOMY WITH HEMORRHOID BANDING    . IR FLUORO GUIDE CV LINE RIGHT  04/06/2017  . IR GENERIC HISTORICAL  05/19/2016   IR CM INJ ANY COLONIC TUBE W/FLUORO 05/19/2016 Aletta Edouard, MD [facility]  . IR PATIENT EVAL TECH 0-60 MINS  11/30/2017  . IR REPLC DUODEN/JEJUNO TUBE PERCUT W/FLUORO  03/14/2018  . IR REPLC DUODEN/JEJUNO TUBE PERCUT W/FLUORO  03/15/2018  . IR US GUIDE VASC ACCESS RIGHT  04/06/2017  . j tube removed march 2018    . KNEE ARTHROSCOPY Left 09/06/2014  . LAPAROSCOPIC GASTRIC SLEEVE RESECTION N/A 01/14/2016   Procedure: LAPAROSCOPIC GASTRIC SLEEVE RESECTION;  Surgeon: Excell Seltzer, MD;  Location: [facility];  Service: General;  Laterality: N/A;  . LAPAROSCOPIC REVISION OF GASTROJEJUNOSTOMY N/A 10/25/2017   Procedure: LAPAROSCOPIC REVISION OF GASTROJEJUNOSTOMY AND PARTIAL GASTRECTOMY, WITH PLACEMENT OF FEEDING GASTROSTOMY TUBE;  Surgeon: Excell Seltzer, MD;  Location: [facility];  Service: General;  Laterality: N/A;  . LAPAROSCOPIC TUBAL LIGATION  10/16/2011   Procedure: LAPAROSCOPIC TUBAL LIGATION;  Surgeon: Alwyn Pea, MD;  Location: [facility];  Service: Gynecology;  Laterality: Bilateral;  . NOVASURE ABLATION  09/28/2010   mild persistent vaginal bleeding  . right knee arthroscopy     05/07/2016  . TOTAL KNEE ARTHROPLASTY Left 03/10/2018   Procedure: LEFT TOTAL KNEE ARTHROPLASTY;  Surgeon: Paralee Cancel, MD;  Location: [facility];  Service: Orthopedics;  Laterality: Left;  70 mins  . TOTAL KNEE ARTHROPLASTY Right 01/23/2020   Procedure: TOTAL KNEE ARTHROPLASTY, BILATERAL TROCHANTERIC INJECTION;  Surgeon: Paralee Cancel, MD;  Location: [facility];  Service: Orthopedics;  Laterality: Right;  70 mins for Bilateral Troch injection 1 cc Depo and 2 CC Lidocaine  . TUBAL LIGATION    . VAGINAL HYSTERECTOMY Bilateral 06/08/2017   Procedure: HYSTERECTOMY VAGINAL W/ BILATERAL SALPINGECTOMY;  Surgeon: [name], MD;  Location: [facility];  Service: Gynecology;  Laterality: Bilateral;  . wisdom  teeth extracted       OB History    Gravida  4   Para  4   Term  4   Preterm      AB      Living  3     SAB      IAB      Ectopic      Multiple      Live Births              Family History  Problem Relation Age of Onset  . Hypertension Mother   . Diabetes Mother   . Sarcoidosis Mother        lungs and skin  . Asthma Mother   . Hypertension Father   . Diabetes Father   . Asthma Son   . Tics Son   . Asthma Sister   . Cancer Sister        possible pancreatic cancer  . Adrenal disorder Sister        Tumor   . Asthma Brother   . Asthma Daughter        died age 77.5  . Cancer Daughter 4       brain; died age 77.5  . Asthma Son   . Cancer Paternal Aunt        brain, colon, lung and esophagus; unsure of primary   . Breast cancer Paternal Aunt     Social History   Tobacco Use  . Smoking status: Former Smoker    Packs/day: 0.50    Years: 20.00    Pack years: 10.00    Types: Cigarettes    Quit date: 03/17/2014    Years since quitting: 6.7  . Smokeless tobacco: Never Used  Vaping Use  . Vaping Use: Never used  Substance Use Topics  . Alcohol use: Yes    Alcohol/week: 0.0 standard drinks  . Drug use: No    Home Medications Prior to Admission medications   Medication Sig Start Date End Date Taking? Authorizing Provider  acetaminophen (TYLENOL) 500 MG tablet Take 2 tablets (1,000 mg total) by mouth every 6 (six) hours as needed. 01/24/20   Maurice March, PA-C  albuterol (VENTOLIN HFA) 108 (90 Base) MCG/ACT inhaler Inhale 2 puffs into the lungs every 6 (six) hours as needed for wheezing or shortness of breath.    [provider]  baclofen (LIORESAL) 10 MG tablet TAKE 1 TABLET BY  MOUTH EVERYDAY AT BEDTIME 08/01/20   Kathrynn Ducking, MD  Botulinum Toxin Type A (BOTOX) 200 units SOLR INJECT 155 UNITS INTO THE  MUSCLES OF HEAD AND NECK  EVERY 3 MONTH (GIVEN AT MD  OFFICE, DISCARD UNUSED) 08/29/20   Kathrynn Ducking, MD  buPROPion (WELLBUTRIN  SR) 150 MG 12 hr tablet TAKE 1 TABLET(S) BY MOUTH EVERY MORNING FOR DEPRESSION 06/05/20   [provider]  buPROPion (WELLBUTRIN XL) 300 MG 24 hr tablet Take 300 mg by mouth every morning. 06/05/20   [provider]  buPROPion (ZYBAN) 150 MG 12 hr tablet bupropion HCl 150 mg tablet,12 hr sustained-release(smoking deterrent)  Take 1 tablet twice a day by oral route.    [provider]  busPIRone (BUSPAR) 10 MG tablet Take 10 mg by mouth 3 (three) times daily.    [provider]  celecoxib (CELEBREX) 200 MG capsule Take 1 capsule (200 mg total) by mouth 2 (two) times daily. 01/24/20   Maurice March, PA-C  Cholecalciferol (VITAMIN D3) 1.25 MG (50000 UT) CAPS Take 50,000 Units by mouth every 7 (seven) days.  04/03/19   [provider]  cyanocobalamin (,VITAMIN B-12,) 1000 MCG/ML injection Inject 1,000 mcg into the muscle 2 (two) times a week.  04/06/19   [provider]  diclofenac Sodium (VOLTAREN) 1 % GEL Apply topically 4 (four) times daily.    [provider]  DULoxetine (CYMBALTA) 60 MG capsule Take 1 capsule (60 mg total) by mouth 2 (two) times daily. 04/10/19   Connye Burkitt, NP  ferrous sulfate 325 (65 FE) MG tablet Take 1 tablet (325 mg total) by mouth 2 (two) times daily with a meal. 01/24/20   Stinson, Nicola Girt, PA-C  fluticasone (FLONASE) 50 MCG/ACT nasal spray Place 2 sprays into both nostrils daily as needed for allergies or rhinitis.    [provider]  HYDROmorphone (DILAUDID) 4 MG tablet Take 1-2 tablets (4-8 mg total) by mouth every 4 (four) hours as needed for moderate pain or severe pain. Patient taking differently: Take 1-2 tablets (4-8 mg total) by mouth every 3 hours as needed for moderate pain or severe pain. 01/24/20   Maurice March, PA-C  linaclotide (LINZESS) 290 MCG CAPS capsule Take 290 mcg by mouth daily before breakfast.    [provider]  mesalamine (LIALDA) 1.2 g EC tablet Lialda 1.2 gram  tablet,delayed release    [provider]  methocarbamol (ROBAXIN) 500 MG tablet Take 500 mg by mouth every 8 (eight) hours as needed for muscle spasms.     [provider]  metoCLOPramide (REGLAN) 10 MG tablet TAKE 1 TABLET (10 MG TOTAL) BY MOUTH 3 (THREE) TIMES DAILY BEFORE MEALS. 01/15/20   Gatha Mayer, MD  Multiple Vitamin (MULTIVITAMIN WITH MINERALS) TABS tablet Take 1 tablet by mouth daily.    [provider]  NARCAN 4 MG/0.1ML LIQD nasal spray kit 1 spray as directed. 03/18/20   [provider]  ondansetron (ZOFRAN) 8 MG tablet Take 8 mg by mouth every 8 (eight) hours as needed for nausea or vomiting.    [provider]  oxybutynin (DITROPAN) 5 MG tablet Take 1 tablet (5 mg total) by mouth 2 (two) times daily. 10/03/20   Kathrynn Ducking, MD  pantoprazole (PROTONIX) 40 MG tablet TAKE 1 TABLET BY MOUTH TWICE A DAY BEFORE A MEAL 02/22/20   Gatha Mayer, MD  sucralfate (CARAFATE) 1 g tablet Take 2 g by mouth 2 (two)  times daily. 06/12/20   [provider]  thiamine (VITAMIN B-1) 100 MG tablet Take 100 mg by mouth daily at 12 noon.     [provider]    Allergies    Coconut oil, Morphine and related, Oxycodone, Ciprofloxacin, Trazodone and nefazodone, Vistaril [hydroxyzine hcl], Doxycycline, Adhesive [tape], and Penicillins  Review of Systems   Review of Systems  All other systems reviewed and are negative.   Physical Exam Updated Vital Signs BP (!) 131/97   Pulse 75   Temp 98.6 F (37 C) (Oral)   Resp 18   Ht 1.702 m (5' 7" )   Wt 89.4 kg   LMP 06/02/2017 Comment: hysterectomy 06/08/2017  SpO2 98%   BMI 30.85 kg/m   Physical Exam Vitals and nursing note reviewed.  Constitutional:      General: She is not in acute distress.    Appearance: Normal appearance. She is well-developed and well-nourished. She is not toxic-appearing.  HENT:     Head: Normocephalic and atraumatic.  Eyes:     General: Lids are normal.      Extraocular Movements: EOM normal.     Conjunctiva/sclera: Conjunctivae normal.     Pupils: Pupils are equal, round, and reactive to light.  Neck:     Thyroid: No thyroid mass.     Trachea: No tracheal deviation.   Cardiovascular:     Rate and Rhythm: Normal rate and regular rhythm.     Heart sounds: Normal heart sounds. No murmur heard. No gallop.   Pulmonary:     Effort: Pulmonary effort is normal. No respiratory distress.     Breath sounds: Normal breath sounds. No stridor. No decreased breath sounds, wheezing, rhonchi or rales.  Abdominal:     General: Bowel sounds are normal. There is no distension.     Palpations: Abdomen is soft.     Tenderness: There is no abdominal tenderness. There is no CVA tenderness or rebound.  Musculoskeletal:        General: No tenderness or edema. Normal range of motion.     Cervical back: Normal range of motion and neck supple. Spinous process tenderness and muscular tenderness present.  Skin:    General: Skin is warm and dry.     Findings: No abrasion or rash.  Neurological:     General: No focal deficit present.     Mental Status: She is alert and oriented to person, place, and time.     GCS: GCS eye subscore is 4. GCS verbal subscore is 5. GCS motor subscore is 6.     Cranial Nerves: No cranial nerve deficit.     Sensory: No sensory deficit.     Motor: Motor function is intact.     Deep Tendon Reflexes: Strength normal.  Psychiatric:        Mood and Affect: Mood and affect normal.        Speech: Speech normal.        Behavior: Behavior normal.     ED Results / Procedures / Treatments   Labs (all labs ordered are listed, but only abnormal results are displayed) Labs Reviewed - No data to display  EKG None  Radiology No results found.  Procedures Procedures   Medications Ordered in ED Medications  metoCLOPramide (REGLAN) injection 5 mg (has no administration in time range)  LORazepam (ATIVAN) injection 0.5 mg (has no  administration in time range)    ED Course  I have reviewed the triage vital signs and the  nursing notes.  Pertinent labs & imaging results that were available during my care of the patient were reviewed by me and considered in my medical decision making (see chart for details).    MDM Rules/Calculators/A&P                          Patient medicated here for muscle spasm and feels better.  CT of head and neck without acute findings.  Will discharge home Final Clinical Impression(s) / ED Diagnoses Final diagnoses:  None    Rx / DC Orders ED Discharge Orders    None       Lacretia Leigh, MD 11/27/20 [phone]

## 2020-11-27 NOTE — ED Triage Notes (Signed)
Pt states she was standing on a foot board last night and fell. Pt states she hit her head and neck on the wall and her back hit the floor. Pt reports vomiting last night and today. Pt reports +LOC for a few minutes when the fall happened.

## 2020-11-28 ENCOUNTER — Ambulatory Visit (INDEPENDENT_AMBULATORY_CARE_PROVIDER_SITE_OTHER): Payer: 59 | Admitting: Plastic Surgery

## 2020-11-28 ENCOUNTER — Encounter: Payer: Self-pay | Admitting: Plastic Surgery

## 2020-11-28 DIAGNOSIS — R1033 Periumbilical pain: Secondary | ICD-10-CM | POA: Diagnosis not present

## 2020-11-28 NOTE — Progress Notes (Signed)
Patient presents to discuss her options for body contouring.  I seen her previously and we had discussed panniculectomy with upper abdominal liposuction and plication.  It sounds like she was denied for this procedure due to an exclusion on her policy.  She still bothered by the excess abdominal skin and wants to talk about her thighs as well.  She did have weight loss surgery and lost over 200 pounds.  On exam she has excess skin mostly in her abdomen with a little bit of excess adipose tissue.  I suspect a small umbilical hernia but will get a CT scan to confirm.  For her thighs she has moderate excess skin and adipose tissue with no obvious scars.  I do think the best operation for her abdomen would be an abdominoplasty.  I will plan to get a CT scan to look for any hernia defect so we could address that at the same time if they are present.  For the thighs I think a medial thigh lift with liposuction and a vertical and horizontal incision would be best to address her contour.  I explained in detail where the scars would be for both of these procedures and the details in which the procedures are carried out.  I explained the risk that include bleeding, infection, damage to surrounding structures and need for additional procedures.  I explained the expected postoperative recovery process.  I explained the potential for persistent contour irregularities.  All her questions were answered and we will plan to move forward.

## 2020-12-11 ENCOUNTER — Other Ambulatory Visit: Payer: Self-pay

## 2020-12-11 ENCOUNTER — Ambulatory Visit (HOSPITAL_BASED_OUTPATIENT_CLINIC_OR_DEPARTMENT_OTHER)
Admission: RE | Admit: 2020-12-11 | Discharge: 2020-12-11 | Disposition: A | Discharge status: Home / Self Care | Payer: 59 | Source: Ambulatory Visit | Attending: Plastic Surgery | Admitting: Plastic Surgery

## 2020-12-11 ENCOUNTER — Ambulatory Visit (HOSPITAL_BASED_OUTPATIENT_CLINIC_OR_DEPARTMENT_OTHER): Payer: 59

## 2020-12-11 DIAGNOSIS — R1033 Periumbilical pain: Secondary | ICD-10-CM | POA: Insufficient documentation

## 2020-12-11 DIAGNOSIS — Z9884 Bariatric surgery status: Secondary | ICD-10-CM | POA: Diagnosis not present

## 2020-12-11 DIAGNOSIS — K5900 Constipation, unspecified: Secondary | ICD-10-CM | POA: Diagnosis not present

## 2020-12-18 ENCOUNTER — Other Ambulatory Visit: Payer: Self-pay

## 2020-12-18 ENCOUNTER — Encounter: Payer: Self-pay | Admitting: Neurology

## 2020-12-18 ENCOUNTER — Ambulatory Visit (INDEPENDENT_AMBULATORY_CARE_PROVIDER_SITE_OTHER): Payer: 59 | Admitting: Neurology

## 2020-12-18 VITALS — BP 118/77 | HR 80 | Ht 68.0 in | Wt 206.0 lb

## 2020-12-18 DIAGNOSIS — G8929 Other chronic pain: Secondary | ICD-10-CM

## 2020-12-18 DIAGNOSIS — G43711 Chronic migraine without aura, intractable, with status migrainosus: Secondary | ICD-10-CM | POA: Diagnosis not present

## 2020-12-18 DIAGNOSIS — M545 Low back pain, unspecified: Secondary | ICD-10-CM

## 2020-12-18 MED ORDER — BACLOFEN 10 MG PO TABS: ORAL_TABLET | ORAL | 1 refills | Status: DC

## 2020-12-18 NOTE — Progress Notes (Signed)
PATIENT: [name] DOB: [birth year]  REASON FOR VISIT: follow up HISTORY FROM: patient  HISTORY OF PRESENT ILLNESS: Today 12/18/20  [name] is a 45 year old female with history of intractable headache. She remains on Botox therapy, last in January 2022. Had a fall on 11/27/20, standing on her son's bed, lost her balance, fell into the wall, reports LOC. She went to ER, she had vomiting that night, CT head, CT cervical spine no acute abnormalities. Since then, having more frequent migraines, nearing time for next Botox. Has been taking baclofen 20 mg at bedtime to help with headaches, helping to fall asleep. Next Botox is Aprill 11th, has done well, clear benefit. Only a few migraines a month with Botox. Considering plastic surgery to abdomen and liposuction. Has been dealing with Depression, worsening lately, moved to a bigger place. Anxious today about coming out in rain, sees psych.  Had MRI of the lumbar spine in February, showed mild degenerative changes.  Her PCP does her pain management, takes Robaxin, and Dilaudid.  Update 06/17/2020 SS: [name] is a 45 year old female with history of intractable headache.  She had her first Botox therapy in July 2021. Before botox having near daily significant migraine, since Botox still 5-6 headaches a week, but only 3 are significant migraine.  She has stopped Aimovig, Botox has been more helpful, the first month after Botox, the headaches were best.  She has chronic colitis, had syncope episode a few weeks ago, following diarrhea.  She is on chronic opioid therapy for chronic pain.  Her psychiatrist has recently adjusted her Wellbutrin.  Had a right knee replacement a few months ago.  Is on disability.  Takes baclofen at bedtime for leg cramps.  For headache, takes Tylenol.  Presents today for evaluation unaccompanied.  HISTORY 02/05/2020 Dr. Jannifer Franklin: [name] is a 45 year old right-handed black female with a history of intractable headaches.   The patient has diffuse neuromuscular discomfort, she is followed through the [facility] and is on chronic opioid therapy.  She recently had a right total knee replacement, she continues on Dilaudid.  She has been placed on Aimovig for her headaches which seem to help some, she has had fewer severe headaches, one severe headache each week at this point.  She continues however to have daily headaches of some sort.  She has a lot of neck and back discomfort that is chronic.  She has been on baclofen taking 10 mg at night for leg cramps which has been helpful.  She currently is walking with a walker after the knee surgery.  She continues to have chronic nausea after her bariatric surgery.  She returns for further evaluation.   REVIEW OF SYSTEMS: Out of a complete 14 system review of symptoms, the patient complains only of the following symptoms, and all other reviewed systems are negative.  Headache  ALLERGIES: Allergies  Allergen Reactions  . Coconut Oil Anaphylaxis and Itching  . Morphine And Related Anaphylaxis and Hives    Pt states that she has tolerated Norco and Dilaudid  . Oxycodone Anaphylaxis  . Ciprofloxacin Itching and Rash  . Trazodone And Nefazodone Rash  . Vistaril [Hydroxyzine Hcl] Hives  . Doxycycline Nausea And Vomiting  . Adhesive [Tape] Rash    And paper tape causes a rash if wearing for a prolong period of time  . Penicillins Rash    Has patient had a PCN reaction causing immediate rash, facial/tongue/throat swelling, SOB or lightheadedness with hypotension: Yes Has  patient had a PCN reaction causing severe rash involving mucus membranes or skin necrosis: No Has patient had a PCN reaction that required hospitalization No Has patient had a PCN reaction occurring within the last 10 years: No If all of the above answers are "NO", then may proceed with Cephalosporin use.    HOME MEDICATIONS: Outpatient Medications Prior to Visit  Medication Sig Dispense Refill  .  acetaminophen (TYLENOL) 500 MG tablet Take 2 tablets (1,000 mg total) by mouth every 6 (six) hours as needed. 30 tablet 0  . albuterol (VENTOLIN HFA) 108 (90 Base) MCG/ACT inhaler Inhale 2 puffs into the lungs every 6 (six) hours as needed for wheezing or shortness of breath.    . Botulinum Toxin Type A (BOTOX) 200 units SOLR INJECT 155 UNITS INTO THE  MUSCLES OF HEAD AND NECK  EVERY 3 MONTH (GIVEN AT MD  OFFICE, DISCARD UNUSED) 1 each 1  . buPROPion (WELLBUTRIN SR) 150 MG 12 hr tablet TAKE 1 TABLET(S) BY MOUTH EVERY MORNING FOR DEPRESSION    . buPROPion (WELLBUTRIN XL) 300 MG 24 hr tablet Take 300 mg by mouth every morning.    Marland Kitchen buPROPion (ZYBAN) 150 MG 12 hr tablet bupropion HCl 150 mg tablet,12 hr sustained-release(smoking deterrent)  Take 1 tablet twice a day by oral route.    . busPIRone (BUSPAR) 10 MG tablet Take 10 mg by mouth 3 (three) times daily.    . celecoxib (CELEBREX) 200 MG capsule Take 1 capsule (200 mg total) by mouth 2 (two) times daily. 60 capsule 0  . Cholecalciferol (VITAMIN D3) 1.25 MG (50000 UT) CAPS Take 50,000 Units by mouth every 7 (seven) days.     . cyanocobalamin (,VITAMIN B-12,) 1000 MCG/ML injection Inject 1,000 mcg into the muscle 2 (two) times a week.     . diclofenac Sodium (VOLTAREN) 1 % GEL Apply topically 4 (four) times daily.    Marland Kitchen dicyclomine (BENTYL) 10 MG capsule Take 10 mg by mouth 2 (two) times daily as needed.    . DULoxetine (CYMBALTA) 60 MG capsule Take 1 capsule (60 mg total) by mouth 2 (two) times daily. 60 capsule 0  . ferrous sulfate 325 (65 FE) MG tablet Take 1 tablet (325 mg total) by mouth 2 (two) times daily with a meal. 28 tablet 3  . fluticasone (FLONASE) 50 MCG/ACT nasal spray Place 2 sprays into both nostrils daily as needed for allergies or rhinitis.    Marland Kitchen HYDROmorphone (DILAUDID) 4 MG tablet Take 1-2 tablets (4-8 mg total) by mouth every 4 (four) hours as needed for moderate pain or severe pain. (Patient taking differently: Take 1-2 tablets  (4-8 mg total) by mouth every 3 hours as needed for moderate pain or severe pain.)  0  . ibuprofen (ADVIL) 600 MG tablet Take 1 tablet (600 mg total) by mouth every 6 (six) hours as needed. 30 tablet 0  . linaclotide (LINZESS) 290 MCG CAPS capsule Take 290 mcg by mouth daily before breakfast.    . mesalamine (LIALDA) 1.2 g EC tablet Lialda 1.2 gram tablet,delayed release    . methocarbamol (ROBAXIN) 500 MG tablet Take 500 mg by mouth every 8 (eight) hours as needed for muscle spasms.     . metoCLOPramide (REGLAN) 10 MG tablet TAKE 1 TABLET (10 MG TOTAL) BY MOUTH 3 (THREE) TIMES DAILY BEFORE MEALS. 90 tablet 0  . Multiple Vitamin (MULTIVITAMIN WITH MINERALS) TABS tablet Take 1 tablet by mouth daily.    Marland Kitchen NARCAN 4 MG/0.1ML LIQD nasal spray  kit 1 spray as directed.    . ondansetron (ZOFRAN) 8 MG tablet Take 8 mg by mouth every 8 (eight) hours as needed for nausea or vomiting.    Marland Kitchen oxybutynin (DITROPAN) 5 MG tablet Take 1 tablet (5 mg total) by mouth 2 (two) times daily. 60 tablet 3  . pantoprazole (PROTONIX) 40 MG tablet TAKE 1 TABLET BY MOUTH TWICE A DAY BEFORE A MEAL 180 tablet 1  . sucralfate (CARAFATE) 1 g tablet Take 2 g by mouth 2 (two) times daily.    Marland Kitchen thiamine (VITAMIN B-1) 100 MG tablet Take 100 mg by mouth daily at 12 noon.     . baclofen (LIORESAL) 10 MG tablet TAKE 1 TABLET BY MOUTH EVERYDAY AT BEDTIME 90 tablet 1   No facility-administered medications prior to visit.    PAST MEDICAL HISTORY: Past Medical History:  Diagnosis Date  . Anal fissure - posterior 10/16/2014    OCC  ISSUES  . Anxiety    doesn't take anything  . Asthma    has Albuterol inhaler as needed  . Boil    on pubic area started septra 03-01-17 draining blood and pus  . Bronchitis   . Chronic headache disorder 07/22/2016  . Clostridium difficile infection 04/20/2012  . Colitis   . Dehydration   . Depression   . Family history of adverse reaction to anesthesia    pt mom gets sick  . GERD (gastroesophageal  reflux disease)    takes Pantoprazole daily  . Headache   . Hiatal hernia    neuropathy - mild in arms and legs  . History of blood transfusion    last transfusion was 04/04/2016=Benadryl was given d/t itching. States she always itches with transfusion.   Marland Kitchen History of bronchitis    > 2 yrs ago  . History of colon polyps    benign  . History of migraine    last one 05/01/16  . History of stomach ulcers   . History of urinary tract infection LAST 2 WEEKS AGO  . IDA (iron deficiency anemia)   . Internal and external bleeding hemorrhoids 06/11/2014  . Joint pain   . Joint swelling   . Left sided chronic colitis - segmental 06/11/2014  . Marginal ulcer 10/25/2017  . Migraine   . Motion sickness   . Nausea    takes Zofran as needed  . Nausea and vomiting    for 1 year  . Obesity   . Oligouria   . Osteoarthritis    lower back, knees, wrists - no meds  . Peripheral neuropathy 03/01/2019  . Pneumonia 1997  . Postoperative nausea and vomiting 01/21/2016   wants scopolamine patch  . Right carpal tunnel syndrome 03/01/2019  . SVD (spontaneous vaginal delivery)    x 4  . Tingling    BOTH LOWER EXTREMETIES ALL THE TIME DUE TO RAPID WEIGHT LOSS  . TOBACCO USER 10/02/2009   Qualifier: Diagnosis of  By: Dimas Millin MD, Ellard Artis    . Transfusion history    last transfusion 6'16   . Tremor 08/31/2018  . UC (ulcerative colitis) ([place])    supposed to be taking Lialda and Bentyl but has been off since gastric sleeve  . Vertigo    doesn't take any meds    PAST SURGICAL HISTORY: Past Surgical History:  Procedure Laterality Date  . ABDOMINAL HYSTERECTOMY     PARTIAL  . COLONOSCOPY  2007   for rectal bleeding; [facility]  . COLONOSCOPY N/A 06/11/2014  Procedure: COLONOSCOPY;  Surgeon: Gatha Mayer, MD;  Location: [facility];  Service: Endoscopy;  Laterality: N/A;  . COLONOSCOPY WITH PROPOFOL N/A 03/02/2017   Procedure: COLONOSCOPY WITH PROPOFOL;  Surgeon: Alphonsa Overall, MD;  Location: [facility];   Service: General;  Laterality: N/A;  . DILATION AND CURETTAGE OF UTERUS    . ESOPHAGOGASTRODUODENOSCOPY (EGD) WITH PROPOFOL N/A 04/06/2016   Procedure: ESOPHAGOGASTRODUODENOSCOPY (EGD) WITH PROPOFOL;  Surgeon: Alphonsa Overall, MD;  Location: [facility];  Service: General;  Laterality: N/A;  . ESOPHAGOGASTRODUODENOSCOPY (EGD) WITH PROPOFOL N/A 03/02/2017   Procedure: ESOPHAGOGASTRODUODENOSCOPY (EGD) WITH PROPOFOL;  Surgeon: Alphonsa Overall, MD;  Location: [facility];  Service: General;  Laterality: N/A;  . ESOPHAGOGASTRODUODENOSCOPY (EGD) WITH PROPOFOL N/A 05/20/2017   Procedure: ESOPHAGOGASTRODUODENOSCOPY (EGD) WITH PROPOFOL ERAS PATHWAY;  Surgeon: Alphonsa Overall, MD;  Location: [facility];  Service: General;  Laterality: N/A;  . ESOPHAGOGASTRODUODENOSCOPY (EGD) WITH PROPOFOL N/A 10/07/2017   Procedure: ESOPHAGOGASTRODUODENOSCOPY (EGD) WITH PROPOFOL;  Surgeon: Alphonsa Overall, MD;  Location: [facility];  Service: General;  Laterality: N/A;  . EXCISION OF SKIN TAG  06/08/2017   Procedure: EXCISION OF VULVAR SKIN TAGS X2;  Surgeon: [name], MD;  Location: [facility];  Service: Gynecology;;  . FOOT SURGERY Bilateral    x 2  . GASTRIC ROUX-EN-Y N/A 12/14/2016   Procedure: LAPAROSCOPIC REVISION SLEEVE GASTRECTOMY TO  ROUX-Y-GASTRIC BY-PASS, UPPER ENDO;  Surgeon: Excell Seltzer, MD;  Location: [facility];  Service: General;  Laterality: N/A;  . GASTROJEJUNOSTOMY N/A 05/11/2016   Procedure: LAPAROSCOPIC PLACEMENT  OF FEEDING  JEJUNOSTOMY TUBE;  Surgeon: Excell Seltzer, MD;  Location: [facility];  Service: General;  Laterality: N/A;  . HEMORRHOIDECTOMY WITH HEMORRHOID BANDING    . IR FLUORO GUIDE CV LINE RIGHT  04/06/2017  . IR GENERIC HISTORICAL  05/19/2016   IR CM INJ ANY COLONIC TUBE W/FLUORO 05/19/2016 Aletta Edouard, MD [facility]  . IR PATIENT EVAL TECH 0-60 MINS  11/30/2017  . IR REPLC DUODEN/JEJUNO TUBE PERCUT W/FLUORO  03/14/2018  . IR REPLC DUODEN/JEJUNO TUBE PERCUT W/FLUORO  03/15/2018  . IR US  GUIDE VASC ACCESS RIGHT  04/06/2017  . j tube removed march 2018    . KNEE ARTHROSCOPY Left 09/06/2014  . LAPAROSCOPIC GASTRIC SLEEVE RESECTION N/A 01/14/2016   Procedure: LAPAROSCOPIC GASTRIC SLEEVE RESECTION;  Surgeon: Excell Seltzer, MD;  Location: [facility];  Service: General;  Laterality: N/A;  . LAPAROSCOPIC REVISION OF GASTROJEJUNOSTOMY N/A 10/25/2017   Procedure: LAPAROSCOPIC REVISION OF GASTROJEJUNOSTOMY AND PARTIAL GASTRECTOMY, WITH PLACEMENT OF FEEDING GASTROSTOMY TUBE;  Surgeon: Excell Seltzer, MD;  Location: [facility];  Service: General;  Laterality: N/A;  . LAPAROSCOPIC TUBAL LIGATION  10/16/2011   Procedure: LAPAROSCOPIC TUBAL LIGATION;  Surgeon: Alwyn Pea, MD;  Location: [facility];  Service: Gynecology;  Laterality: Bilateral;  . NOVASURE ABLATION  09/28/2010   mild persistent vaginal bleeding  . right knee arthroscopy     05/07/2016  . TOTAL KNEE ARTHROPLASTY Left 03/10/2018   Procedure: LEFT TOTAL KNEE ARTHROPLASTY;  Surgeon: Paralee Cancel, MD;  Location: [facility];  Service: Orthopedics;  Laterality: Left;  70 mins  . TOTAL KNEE ARTHROPLASTY Right 01/23/2020   Procedure: TOTAL KNEE ARTHROPLASTY, BILATERAL TROCHANTERIC INJECTION;  Surgeon: Paralee Cancel, MD;  Location: [facility];  Service: Orthopedics;  Laterality: Right;  70 mins for Bilateral Troch injection 1 cc Depo and 2 CC Lidocaine  . TUBAL LIGATION    . VAGINAL HYSTERECTOMY Bilateral 06/08/2017   Procedure: HYSTERECTOMY VAGINAL W/ BILATERAL SALPINGECTOMY;  Surgeon: Darron Doom  S, MD;  Location: [facility];  Service: Gynecology;  Laterality: Bilateral;  . wisdom teeth extracted      FAMILY HISTORY: Family History  Problem Relation Age of Onset  . Hypertension Mother   . Diabetes Mother   . Sarcoidosis Mother        lungs and skin  . Asthma Mother   . Hypertension Father   . Diabetes Father   . Asthma Son   . Tics Son   . Asthma Sister   . Cancer Sister        possible pancreatic cancer  . Adrenal disorder Sister         Tumor   . Asthma Brother   . Asthma Daughter        died age 19.5  . Cancer Daughter 4       brain; died age 19.5  . Asthma Son   . Cancer Paternal Aunt        brain, colon, lung and esophagus; unsure of primary   . Breast cancer Paternal Aunt     SOCIAL HISTORY: Social History   Socioeconomic History  . Marital status: Single    Spouse name: Not on file  . Number of children: 3  . Years of education: Some college  . Highest education level: Not on file  Occupational History  . Occupation: Production designer, theatre/television/film    Comment: Currently out on disability d/t surg  Tobacco Use  . Smoking status: Former Smoker    Packs/day: 0.50    Years: 20.00    Pack years: 10.00    Types: Cigarettes    Quit date: 03/17/2014    Years since quitting: 6.7  . Smokeless tobacco: Never Used  Vaping Use  . Vaping Use: Never used  Substance and Sexual Activity  . Alcohol use: Yes    Alcohol/week: 0.0 standard drinks  . Drug use: No  . Sexual activity: Not Currently    Birth control/protection: Surgical, Abstinence  Other Topics Concern  . Not on file  Social History Narrative   Lives with 2 sons   Right Handed   Drinks very seldom   Social Determinants of Health   Financial Resource Strain: Not on file  Food Insecurity: Not on file  Transportation Needs: Not on file  Physical Activity: Not on file  Stress: Not on file  Social Connections: Not on file  Intimate Partner Violence: Not on file   PHYSICAL EXAM  Vitals:   12/18/20 1033  BP: 118/77  Pulse: 80  Weight: 206 lb (93.4 kg)  Height: 5' 8"  (1.727 m)   Body mass index is 31.32 kg/m.    Generalized: Well developed, in no acute distress, tired appearing Neurological examination  Mentation: Alert oriented to time, place, history taking. Follows all commands speech and language fluent Cranial nerve II-XII: Pupils were equal round reactive to light. Extraocular movements were full, visual field were full on confrontational test.  Facial sensation and strength were normal. Head turning and shoulder shrug were normal and symmetric. Motor: The motor testing reveals 5 over 5 strength of all 4 extremities. Good symmetric motor tone is noted throughout.  Sensory: Sensory testing is intact to soft touch on all 4 extremities. No evidence of extinction is noted.  Coordination: Cerebellar testing reveals good finger-nose-finger and heel-to-shin bilaterally.  Gait and station: Gait is normal, tandem gait is normal, Romberg is negative. Reflexes: Deep tendon reflexes are symmetric and normal bilaterally.   DIAGNOSTIC DATA (LABS, IMAGING, TESTING) -  I reviewed patient records, labs, notes, testing and imaging myself where available.  Lab Results  Component Value Date   WBC 17.5 (H) 01/24/2020   HGB 10.5 (L) 01/24/2020   HCT 32.6 (L) 01/24/2020   MCV 92.9 01/24/2020   PLT 219 01/24/2020      Component Value Date/Time   NA 138 01/24/2020 0253   NA 141 11/21/2019 1523   K 4.6 01/24/2020 0253   CL 108 01/24/2020 0253   CO2 22 01/24/2020 0253   GLUCOSE 169 (H) 01/24/2020 0253   BUN 15 01/24/2020 0253   BUN 6 11/21/2019 1523   CREATININE 0.79 01/24/2020 0253   CREATININE 0.88 06/05/2016 1003   CALCIUM 8.8 (L) 01/24/2020 0253   PROT 6.2 11/21/2019 1523   ALBUMIN 3.8 11/21/2019 1523   AST 13 11/21/2019 1523   ALT 6 11/21/2019 1523   ALKPHOS 77 11/21/2019 1523   BILITOT <0.2 11/21/2019 1523   GFRNONAA >60 01/24/2020 0253   GFRNONAA 84 06/05/2016 1003   GFRAA >60 01/24/2020 0253   GFRAA >89 06/05/2016 1003   Lab Results  Component Value Date   CHOL 168 10/30/2017   HDL 60 10/30/2017   LDLCALC 88 10/30/2017   TRIG 98 10/30/2017   CHOLHDL 2.8 10/30/2017   Lab Results  Component Value Date   HGBA1C 5.1 07/26/2017   Lab Results  Component Value Date   ZSMOLMBE67 544 01/30/2019   Lab Results  Component Value Date   TSH 1.140 01/26/2018   ASSESSMENT AND PLAN 45 y.o. year old female  has a past medical  history of Anal fissure - posterior (10/16/2014), Anxiety, Asthma, Boil, Bronchitis, Chronic headache disorder (07/22/2016), Clostridium difficile infection (04/20/2012), Colitis, Dehydration, Depression, Family history of adverse reaction to anesthesia, GERD (gastroesophageal reflux disease), Headache, Hiatal hernia, History of blood transfusion, History of bronchitis, History of colon polyps, History of migraine, History of stomach ulcers, History of urinary tract infection (LAST 2 WEEKS AGO), IDA (iron deficiency anemia), Internal and external bleeding hemorrhoids (06/11/2014), Joint pain, Joint swelling, Left sided chronic colitis - segmental (06/11/2014), Marginal ulcer (10/25/2017), Migraine, Motion sickness, Nausea, Nausea and vomiting, Obesity, Oligouria, Osteoarthritis, Peripheral neuropathy (03/01/2019), Pneumonia (1997), Postoperative nausea and vomiting (01/21/2016), Right carpal tunnel syndrome (03/01/2019), SVD (spontaneous vaginal delivery), Tingling, TOBACCO USER (10/02/2009), Transfusion history, Tremor (08/31/2018), UC (ulcerative colitis) ([place]), and Vertigo. here with:  1.  Chronic daily headache, migraine 2.  Chronic neuromuscular discomfort, possible fibromyalgia 3.  History of chronic opiate use  -Increase in migraines, nearing next time for Botox, also fall back 11/27/20, went to ER imaging was unremarkable, next Botox January 06, 2021 -Botox offers significant benefit -Continue baclofen for leg cramps/paresthesia, also helps headaches, dose sent in for 10 mg, has been taking 20 mg to help sleep, needs to touch base with PCP, has pain contract, also prescribed Dilaudid, Robaxin -MRI of lumbar spine February 2022 showed mild degenerative changes -Follow-up in 1 year or sooner if needed, will continue every 15-monthappointments for Botox  I spent 30 minutes of face-to-face and non-face-to-face time with patient.  This included previsit chart review, lab review, study review, order entry, electronic  health record documentation, patient education.  SButler Denmark AGNP-C, DNP 12/18/2020, 11:36 AM [facility] [address] [place] [address]([phone]

## 2020-12-18 NOTE — Patient Instructions (Signed)
Make sure to keep your appointment for Botox in April  Keep current medications See you back in 1 year

## 2020-12-19 ENCOUNTER — Telehealth: Payer: Self-pay | Admitting: Neurology

## 2020-12-19 NOTE — Telephone Encounter (Signed)
Patient's next Botox appointment is 4/11. I called [place] and spoke with [facility] to schedule medication delivery. Botox TBD 3/29.

## 2020-12-22 NOTE — Progress Notes (Signed)
I have read the note, and I agree with the clinical assessment and plan.  Graeme Menees K Kennth Vanbenschoten   

## 2020-12-24 NOTE — Telephone Encounter (Signed)
Received (1) 200 unit vial of Botox today from [facility]

## 2020-12-31 ENCOUNTER — Other Ambulatory Visit: Payer: Self-pay | Admitting: Neurology

## 2021-01-06 ENCOUNTER — Telehealth: Payer: Self-pay | Admitting: Neurology

## 2021-01-06 ENCOUNTER — Ambulatory Visit: Payer: 59 | Admitting: Neurology

## 2021-01-06 NOTE — Telephone Encounter (Signed)
This patient did not show for a Botox therapy session today.

## 2021-02-04 ENCOUNTER — Ambulatory Visit (INDEPENDENT_AMBULATORY_CARE_PROVIDER_SITE_OTHER): Payer: 59 | Admitting: Neurology

## 2021-02-04 ENCOUNTER — Encounter: Payer: Self-pay | Admitting: Neurology

## 2021-02-04 ENCOUNTER — Other Ambulatory Visit: Payer: Self-pay

## 2021-02-04 VITALS — BP 131/88 | HR 90 | Ht 67.0 in | Wt 217.2 lb

## 2021-02-04 DIAGNOSIS — G43711 Chronic migraine without aura, intractable, with status migrainosus: Secondary | ICD-10-CM | POA: Diagnosis not present

## 2021-02-04 DIAGNOSIS — G8929 Other chronic pain: Secondary | ICD-10-CM

## 2021-02-04 DIAGNOSIS — R519 Headache, unspecified: Secondary | ICD-10-CM

## 2021-02-04 MED ORDER — ONABOTULINUMTOXINA 100 UNITS IJ SOLR
200.0000 [IU] | Freq: Once | INTRAMUSCULAR | Status: AC
  Administered 2021-02-04: 200 [IU] via INTRAMUSCULAR

## 2021-02-04 NOTE — Procedures (Signed)
     BOTOX PROCEDURE NOTE FOR MIGRAINE HEADACHE   HISTORY: [name] is a 45 year old patient with a history of intractable migraine headaches who has benefited greatly from the use of Botox.  She is 1 month overdue for her Botox, she has had a significant worsening of her headaches.  She returns for another Botox therapy.   Description of procedure:  The patient was placed in a sitting position. The standard protocol was used for Botox as follows, with 5 units of Botox injected at each site:   -Procerus muscle, midline injection  -Corrugator muscle, bilateral injection  -Frontalis muscle, bilateral injection, with 2 sites each side, medial injection was performed in the upper one third of the frontalis muscle, in the region vertical from the medial inferior edge of the superior orbital rim. The lateral injection was again in the upper one third of the forehead vertically above the lateral limbus of the cornea, 1.5 cm lateral to the medial injection site.  -Temporalis muscle injection, 4 sites, bilaterally. The first injection was 3 cm above the tragus of the ear, second injection site was 1.5 cm to 3 cm up from the first injection site in line with the tragus of the ear. The third injection site was 1.5-3 cm forward between the first 2 injection sites. The fourth injection site was 1.5 cm posterior to the second injection site.  -Occipitalis muscle injection, 3 sites, bilaterally. The first injection was done one half way between the occipital protuberance and the tip of the mastoid process behind the ear. The second injection site was done lateral and superior to the first, 1 fingerbreadth from the first injection. The third injection site was 1 fingerbreadth superiorly and medially from the first injection site.  -Cervical paraspinal muscle injection, 2 sites, bilateral, the first injection site was 1 cm from the midline of the cervical spine, 3 cm inferior to the lower border of the  occipital protuberance. The second injection site was 1.5 cm superiorly and laterally to the first injection site.  -Trapezius muscle injection was performed at 3 sites, bilaterally. The first injection site was in the upper trapezius muscle halfway between the inflection point of the neck, and the acromion. The second injection site was one half way between the acromion and the first injection site. The third injection was done between the first injection site and the inflection point of the neck.   A 200 unit bottle of Botox was used, 155 units were injected, the rest of the Botox was wasted. The patient tolerated the procedure well, there were no complications of the above procedure.  Botox NDC 4917-9150-56 Lot number [id number] Expiration date November 2024

## 2021-02-04 NOTE — Progress Notes (Signed)
Please refer to Botox procedure note.

## 2021-02-09 ENCOUNTER — Other Ambulatory Visit: Payer: Self-pay | Admitting: Neurology

## 2021-04-10 ENCOUNTER — Telehealth: Payer: Self-pay | Admitting: Neurology

## 2021-04-10 NOTE — Telephone Encounter (Signed)
Patient's next Botox appointment is 8/22. Per [employer], a PA is needed through [facility] for the Botox. I obtained PA, PA #[id number] (04/10/21- 04/10/22).

## 2021-04-12 ENCOUNTER — Emergency Department (HOSPITAL_COMMUNITY)
Admission: EM | Admit: 2021-04-12 | Discharge: 2021-04-12 | Disposition: A | Discharge status: Home / Self Care | Payer: 59 | Attending: Emergency Medicine | Admitting: Emergency Medicine

## 2021-04-12 ENCOUNTER — Encounter (HOSPITAL_COMMUNITY): Payer: Self-pay

## 2021-04-12 ENCOUNTER — Other Ambulatory Visit: Payer: Self-pay

## 2021-04-12 DIAGNOSIS — W57XXXA Bitten or stung by nonvenomous insect and other nonvenomous arthropods, initial encounter: Secondary | ICD-10-CM | POA: Insufficient documentation

## 2021-04-12 DIAGNOSIS — S80262A Insect bite (nonvenomous), left knee, initial encounter: Secondary | ICD-10-CM | POA: Diagnosis not present

## 2021-04-12 DIAGNOSIS — J45909 Unspecified asthma, uncomplicated: Secondary | ICD-10-CM | POA: Insufficient documentation

## 2021-04-12 DIAGNOSIS — L299 Pruritus, unspecified: Secondary | ICD-10-CM | POA: Diagnosis not present

## 2021-04-12 DIAGNOSIS — L539 Erythematous condition, unspecified: Secondary | ICD-10-CM | POA: Diagnosis not present

## 2021-04-12 DIAGNOSIS — Z87891 Personal history of nicotine dependence: Secondary | ICD-10-CM | POA: Insufficient documentation

## 2021-04-12 DIAGNOSIS — T63441A Toxic effect of venom of bees, accidental (unintentional), initial encounter: Secondary | ICD-10-CM

## 2021-04-12 DIAGNOSIS — Z86010 Personal history of colonic polyps: Secondary | ICD-10-CM | POA: Diagnosis not present

## 2021-04-12 DIAGNOSIS — Z96653 Presence of artificial knee joint, bilateral: Secondary | ICD-10-CM | POA: Diagnosis not present

## 2021-04-12 MED ORDER — EPINEPHRINE 0.3 MG/0.3ML IJ SOAJ: 0.3000 mg | INTRAMUSCULAR | 0 refills | Status: AC | PRN

## 2021-04-12 MED ORDER — DIPHENHYDRAMINE HCL 25 MG PO CAPS
50.0000 mg | ORAL_CAPSULE | Freq: Once | ORAL | Status: AC
  Administered 2021-04-12: 50 mg via ORAL
  Filled 2021-04-12: qty 2

## 2021-04-12 MED ORDER — IBUPROFEN 200 MG PO TABS
600.0000 mg | ORAL_TABLET | Freq: Once | ORAL | Status: AC
  Administered 2021-04-12: 600 mg via ORAL
  Filled 2021-04-12: qty 3

## 2021-04-12 NOTE — ED Provider Notes (Signed)
[facility] DEPT Provider Note   CSN: [id number] Arrival date & time: 04/12/21  1141     History Chief Complaint  Patient presents with   Insect Bite    [name] is a 45 y.o. female with medical history as noted below.  Patient presents emerged department with a chief complaint of insect sting.  Patient reports that she was stung by a wasp or hornet approximate 30 minutes prior to arrival in the emergency department.  Patient was stung once to her left knee.  Patient complains of swelling and pain to bite site.  Patient also complains of generalized pruritus and "small red dots," to bilateral upper and lower extremities.  Denies any known allergies to bee stings.  Patient denies any nausea, vomiting, diarrhea, trouble swallowing, trouble breathing,.    HPI     Past Medical History:  Diagnosis Date   Anal fissure - posterior 10/16/2014    OCC  ISSUES   Anxiety    doesn't take anything   Asthma    has Albuterol inhaler as needed   Boil    on pubic area started septra 03-01-17 draining blood and pus   Bronchitis    Chronic headache disorder 07/22/2016   Clostridium difficile infection 04/20/2012   Colitis    Dehydration    Depression    Family history of adverse reaction to anesthesia    pt mom gets sick   GERD (gastroesophageal reflux disease)    takes Pantoprazole daily   Headache    Hiatal hernia    neuropathy - mild in arms and legs   History of blood transfusion    last transfusion was 04/04/2016=Benadryl was given d/t itching. States she always itches with transfusion.    History of bronchitis    > 2 yrs ago   History of colon polyps    benign   History of migraine    last one 05/01/16   History of stomach ulcers    History of urinary tract infection LAST 2 WEEKS AGO   IDA (iron deficiency anemia)    Internal and external bleeding hemorrhoids 06/11/2014   Joint pain    Joint swelling    Left sided chronic colitis - segmental  06/11/2014   Marginal ulcer 10/25/2017   Migraine    Motion sickness    Nausea    takes Zofran as needed   Nausea and vomiting    for 1 year   Obesity    Oligouria    Osteoarthritis    lower back, knees, wrists - no meds   Peripheral neuropathy 03/01/2019   Pneumonia 1997   Postoperative nausea and vomiting 01/21/2016   wants scopolamine patch   Right carpal tunnel syndrome 03/01/2019   SVD (spontaneous vaginal delivery)    x 4   Tingling    BOTH LOWER EXTREMETIES ALL THE TIME DUE TO RAPID WEIGHT LOSS   TOBACCO USER 10/02/2009   Qualifier: Diagnosis of  By: Dimas Millin MD, Ellard Artis     Transfusion history    last transfusion 6'16    Tremor 08/31/2018   UC (ulcerative colitis) ([place])    supposed to be taking Lialda and Bentyl but has been off since gastric sleeve   Vertigo    doesn't take any meds    Patient Active Problem List   Diagnosis Date Noted   Yeast infection 02/07/2020   History of urinary urgency 02/07/2020   Urinary frequency 02/07/2020   Dysuria 02/07/2020   Chronic migraine  without aura, with intractable migraine, so stated, with status migrainosus 02/05/2020   S/P right TKA 01/23/2020   Status post total knee replacement, right 01/23/2020   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   Chronic low back pain 10/25/2019   Headache 05/04/2019   Gait instability 05/04/2019   Suicidal ideations    MDD (major depressive disorder), recurrent severe, without psychosis ([place]) 04/08/2019   Peripheral neuropathy 03/01/2019   Right carpal tunnel syndrome 03/01/2019   Chronic constipation 01/26/2019   S/P left TKA 03/10/2018   B12 deficiency 12/01/2017   Malnutrition, calorie ([place]) 03/27/2017   Psychophysiological insomnia 03/18/2017   Chronic headache disorder 07/22/2016   Memory difficulty 07/22/2016   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   Anemia, iron deficiency 10/01/2015   Left sided chronic colitis - segmental 06/11/2014   Asthma 04/26/2012   Reactive depression  10/02/2009    Past Surgical History:  Procedure Laterality Date   ABDOMINAL HYSTERECTOMY     PARTIAL   COLONOSCOPY  2007   for rectal bleeding; [facility]   COLONOSCOPY N/A 06/11/2014   Procedure: COLONOSCOPY;  Surgeon: Gatha Mayer, MD;  Location: [facility];  Service: Endoscopy;  Laterality: N/A;   COLONOSCOPY WITH PROPOFOL N/A 03/02/2017   Procedure: COLONOSCOPY WITH PROPOFOL;  Surgeon: Alphonsa Overall, MD;  Location: [facility];  Service: General;  Laterality: N/A;   DILATION AND CURETTAGE OF UTERUS     ESOPHAGOGASTRODUODENOSCOPY (EGD) WITH PROPOFOL N/A 04/06/2016   Procedure: ESOPHAGOGASTRODUODENOSCOPY (EGD) WITH PROPOFOL;  Surgeon: Alphonsa Overall, MD;  Location: [facility];  Service: General;  Laterality: N/A;   ESOPHAGOGASTRODUODENOSCOPY (EGD) WITH PROPOFOL N/A 03/02/2017   Procedure: ESOPHAGOGASTRODUODENOSCOPY (EGD) WITH PROPOFOL;  Surgeon: Alphonsa Overall, MD;  Location: [facility];  Service: General;  Laterality: N/A;   ESOPHAGOGASTRODUODENOSCOPY (EGD) WITH PROPOFOL N/A 05/20/2017   Procedure: ESOPHAGOGASTRODUODENOSCOPY (EGD) WITH PROPOFOL ERAS PATHWAY;  Surgeon: Alphonsa Overall, MD;  Location: [facility];  Service: General;  Laterality: N/A;   ESOPHAGOGASTRODUODENOSCOPY (EGD) WITH PROPOFOL N/A 10/07/2017   Procedure: ESOPHAGOGASTRODUODENOSCOPY (EGD) WITH PROPOFOL;  Surgeon: Alphonsa Overall, MD;  Location: [facility];  Service: General;  Laterality: N/A;   EXCISION OF SKIN TAG  06/08/2017   Procedure: EXCISION OF VULVAR SKIN TAGS X2;  Surgeon: [name], MD;  Location: [facility];  Service: Gynecology;;   FOOT SURGERY Bilateral    x 2   GASTRIC ROUX-EN-Y N/A 12/14/2016   Procedure: LAPAROSCOPIC REVISION SLEEVE GASTRECTOMY TO  ROUX-Y-GASTRIC BY-PASS, UPPER ENDO;  Surgeon: Excell Seltzer, MD;  Location: [facility];  Service: General;  Laterality: N/A;   GASTROJEJUNOSTOMY N/A 05/11/2016   Procedure: LAPAROSCOPIC PLACEMENT  OF FEEDING  JEJUNOSTOMY TUBE;  Surgeon: Excell Seltzer, MD;   Location: [facility];  Service: General;  Laterality: N/A;   HEMORRHOIDECTOMY WITH HEMORRHOID BANDING     IR FLUORO GUIDE CV LINE RIGHT  04/06/2017   IR GENERIC HISTORICAL  05/19/2016   IR CM INJ ANY COLONIC TUBE W/FLUORO 05/19/2016 Aletta Edouard, MD [facility]   IR PATIENT EVAL TECH 0-60 MINS  11/30/2017   IR [place] DUODEN/JEJUNO TUBE PERCUT W/FLUORO  03/14/2018   IR [place] DUODEN/JEJUNO TUBE PERCUT W/FLUORO  03/15/2018   IR US GUIDE VASC ACCESS RIGHT  04/06/2017   j tube removed march 2018     KNEE ARTHROSCOPY Left 09/06/2014   LAPAROSCOPIC GASTRIC SLEEVE RESECTION N/A 01/14/2016   Procedure: LAPAROSCOPIC GASTRIC SLEEVE RESECTION;  Surgeon: Excell Seltzer, MD;  Location: [facility];  Service: General;  Laterality: N/A;   [place] N/A 10/25/2017  Procedure: LAPAROSCOPIC REVISION OF GASTROJEJUNOSTOMY AND PARTIAL GASTRECTOMY, WITH PLACEMENT OF FEEDING GASTROSTOMY TUBE;  Surgeon: Excell Seltzer, MD;  Location: [facility];  Service: General;  Laterality: N/A;   LAPAROSCOPIC TUBAL LIGATION  10/16/2011   Procedure: LAPAROSCOPIC TUBAL LIGATION;  Surgeon: Alwyn Pea, MD;  Location: [facility];  Service: Gynecology;  Laterality: Bilateral;   NOVASURE ABLATION  09/28/2010   mild persistent vaginal bleeding   right knee arthroscopy     05/07/2016   TOTAL KNEE ARTHROPLASTY Left 03/10/2018   Procedure: LEFT TOTAL KNEE ARTHROPLASTY;  Surgeon: Paralee Cancel, MD;  Location: [facility];  Service: Orthopedics;  Laterality: Left;  70 mins   TOTAL KNEE ARTHROPLASTY Right 01/23/2020   Procedure: TOTAL KNEE ARTHROPLASTY, BILATERAL TROCHANTERIC INJECTION;  Surgeon: Paralee Cancel, MD;  Location: [facility];  Service: Orthopedics;  Laterality: Right;  70 mins for Bilateral Troch injection 1 cc Depo and 2 CC Lidocaine   TUBAL LIGATION     VAGINAL HYSTERECTOMY Bilateral 06/08/2017   Procedure: HYSTERECTOMY VAGINAL W/ BILATERAL SALPINGECTOMY;  Surgeon: [name], MD;  Location: [facility];  Service:  Gynecology;  Laterality: Bilateral;   wisdom teeth extracted       OB History     Gravida  4   Para  4   Term  4   Preterm      AB      Living  3      SAB      IAB      Ectopic      Multiple      Live Births              Family History  Problem Relation Age of Onset   Hypertension Mother    Diabetes Mother    Sarcoidosis Mother        lungs and skin   Asthma Mother    Hypertension Father    Diabetes Father    Asthma Son    Tics Son    Asthma Sister    Cancer Sister        possible pancreatic cancer   Adrenal disorder Sister        Tumor    Asthma Brother    Asthma Daughter        died age 36.5   Cancer Daughter 69       brain; died age 36.5   Asthma Son    Cancer Paternal Aunt        brain, colon, lung and esophagus; unsure of primary    Breast cancer Paternal Aunt     Social History   Tobacco Use   Smoking status: Former    Packs/day: 0.50    Years: 20.00    Pack years: 10.00    Types: Cigarettes    Quit date: 03/17/2014    Years since quitting: 7.0   Smokeless tobacco: Never  Vaping Use   Vaping Use: Never used  Substance Use Topics   Alcohol use: Yes    Alcohol/week: 0.0 standard drinks   Drug use: No    Home Medications Prior to Admission medications   Medication Sig Start Date End Date Taking? Authorizing Provider  acetaminophen (TYLENOL) 500 MG tablet Take 2 tablets (1,000 mg total) by mouth every 6 (six) hours as needed. 01/24/20   Irving Copas, PA-C  albuterol (VENTOLIN HFA) 108 (90 Base) MCG/ACT inhaler Inhale 2 puffs into the lungs every 6 (six) hours as needed for wheezing or shortness of breath.  [provider]  baclofen (LIORESAL) 10 MG tablet TAKE 1 TABLET BY MOUTH EVERYDAY AT BEDTIME 12/18/20   Suzzanne Cloud, NP  Botulinum Toxin Type A (BOTOX) 200 units SOLR INJECT 155 UNITS  INTRAMUSCULARLY INTO HEAD  AND NECK MUSCLES EVERY 3  MONTHS (GIVEN AT MD OFFICE, DISCARD UNUSED) 02/10/21   Kathrynn Ducking, MD   buPROPion (WELLBUTRIN SR) 150 MG 12 hr tablet TAKE 1 TABLET(S) BY MOUTH EVERY MORNING FOR DEPRESSION 06/05/20   [provider]  buPROPion (WELLBUTRIN XL) 300 MG 24 hr tablet Take 300 mg by mouth every morning. 06/05/20   [provider]  buPROPion (ZYBAN) 150 MG 12 hr tablet bupropion HCl 150 mg tablet,12 hr sustained-release(smoking deterrent)  Take 1 tablet twice a day by oral route.    [provider]  busPIRone (BUSPAR) 10 MG tablet Take 10 mg by mouth 3 (three) times daily.    [provider]  celecoxib (CELEBREX) 200 MG capsule Take 1 capsule (200 mg total) by mouth 2 (two) times daily. 01/24/20   Irving Copas, PA-C  Cholecalciferol (VITAMIN D3) 1.25 MG (50000 UT) CAPS Take 50,000 Units by mouth every 7 (seven) days.  04/03/19   [provider]  cyanocobalamin (,VITAMIN B-12,) 1000 MCG/ML injection Inject 1,000 mcg into the muscle 2 (two) times a week.  04/06/19   [provider]  diclofenac Sodium (VOLTAREN) 1 % GEL Apply topically 4 (four) times daily.    [provider]  dicyclomine (BENTYL) 10 MG capsule Take 10 mg by mouth 2 (two) times daily as needed. 12/17/20   [provider]  DULoxetine (CYMBALTA) 60 MG capsule Take 1 capsule (60 mg total) by mouth 2 (two) times daily. 04/10/19   Connye Burkitt, NP  ferrous sulfate 325 (65 FE) MG tablet Take 1 tablet (325 mg total) by mouth 2 (two) times daily with a meal. 01/24/20   Irving Copas, PA-C  fluticasone (FLONASE) 50 MCG/ACT nasal spray Place 2 sprays into both nostrils daily as needed for allergies or rhinitis.    [provider]  HYDROmorphone (DILAUDID) 4 MG tablet Take 1-2 tablets (4-8 mg total) by mouth every 4 (four) hours as needed for moderate pain or severe pain. Patient taking differently: Take 1-2 tablets (4-8 mg total) by mouth every 3 hours as needed for moderate pain or severe pain. 01/24/20   Irving Copas, PA-C  ibuprofen (ADVIL) 600 MG  tablet Take 1 tablet (600 mg total) by mouth every 6 (six) hours as needed. 11/27/20   Lacretia Leigh, MD  linaclotide [facility]) 290 MCG CAPS capsule Take 290 mcg by mouth daily before breakfast.    [provider]  mesalamine (LIALDA) 1.2 g EC tablet Lialda 1.2 gram tablet,delayed release    [provider]  methocarbamol (ROBAXIN) 500 MG tablet Take 500 mg by mouth every 8 (eight) hours as needed for muscle spasms.     [provider]  metoCLOPramide (REGLAN) 10 MG tablet TAKE 1 TABLET (10 MG TOTAL) BY MOUTH 3 (THREE) TIMES DAILY BEFORE MEALS. 01/15/20   Gatha Mayer, MD  Multiple Vitamin (MULTIVITAMIN WITH MINERALS) TABS tablet Take 1 tablet by mouth daily.    [provider]  NARCAN 4 MG/0.1ML LIQD nasal spray kit 1 spray as directed. 03/18/20   [provider]  ondansetron (ZOFRAN) 8 MG tablet Take 8 mg by mouth every 8 (eight) hours as needed for nausea or vomiting.    [provider]  oxybutynin (DITROPAN) 5 MG tablet TAKE 1 TABLET BY MOUTH TWICE A DAY 01/01/21   Kathrynn Ducking, MD  pantoprazole (PROTONIX) 40 MG tablet TAKE 1 TABLET BY MOUTH TWICE A DAY BEFORE A MEAL 02/22/20   Gatha Mayer, MD  sucralfate (CARAFATE) 1 g tablet Take 2 g by mouth 2 (two) times daily. 06/12/20   [provider]  thiamine (VITAMIN B-1) 100 MG tablet Take 100 mg by mouth daily at 12 noon.     [provider]    Allergies    Coconut oil, Morphine and related, Oxycodone, Ciprofloxacin, Trazodone and nefazodone, Vistaril [hydroxyzine hcl], Doxycycline, Adhesive [tape], and Penicillins  Review of Systems   Review of Systems  HENT:  Negative for facial swelling and trouble swallowing.   Respiratory:  Negative for shortness of breath.   Gastrointestinal:  Negative for diarrhea, nausea and vomiting.  Skin:  Positive for rash.   Physical Exam Updated Vital Signs BP (!) 126/109   Pulse 91   Temp 98.3 F (36.8 C) (Oral)   Resp 18   Ht 5'  6.7" (1.694 m)   Wt 95.7 kg   LMP 06/02/2017 Comment: hysterectomy 06/08/2017  SpO2 96%   BMI 33.35 kg/m   Physical Exam Vitals and nursing note reviewed.  Constitutional:      General: She is not in acute distress.    Appearance: She is not ill-appearing, toxic-appearing or diaphoretic.  HENT:     Head: Normocephalic. No right periorbital erythema or left periorbital erythema.     Jaw: No trismus or pain on movement.     Mouth/Throat:     Mouth: Mucous membranes are moist. No injury, lacerations or angioedema.     Pharynx: Oropharynx is clear. Uvula midline. No pharyngeal swelling, oropharyngeal exudate, posterior oropharyngeal erythema or uvula swelling.     Tonsils: No tonsillar exudate or tonsillar abscesses. 1+ on the right. 1+ on the left.     Comments: No swelling to oral mucosa or angioedema.  Patient able to handle oral secretions without difficulty. Eyes:     General: No scleral icterus.       Right eye: No discharge.        Left eye: No discharge.  Cardiovascular:     Rate and Rhythm: Normal rate.  Pulmonary:     Effort: Pulmonary effort is normal. No bradypnea or respiratory distress.     Breath sounds: Normal breath sounds. No stridor.     Comments: Speaks in full complete sentences without difficulty. Musculoskeletal:     Cervical back: No edema or erythema.     Comments: Patient has area of erythema and tenderness to anterior aspect of left knee.  Full range of motion to left knee.  Skin:    General: Skin is warm and dry.     Comments: Small red dots noted to bilateral upper and lower extremities.  No hives, purpura, or petechia.  Neurological:     General: No focal deficit present.     Mental Status: She is alert.  Psychiatric:        Behavior: Behavior is cooperative.    ED Results / Procedures / Treatments   Labs (all labs ordered are listed, but only abnormal results are displayed) Labs Reviewed - No data to display  EKG None  Radiology No results  found.  Procedures Procedures   Medications Ordered in ED Medications  diphenhydrAMINE (BENADRYL) capsule 50 mg (50 mg Oral Given 04/12/21 1211)  ibuprofen (ADVIL) tablet 600  mg (600 mg Oral Given 04/12/21 1211)    ED Course  I have reviewed the triage vital signs and the nursing notes.  Pertinent labs & imaging results that were available during my care of the patient were reviewed by me and considered in my medical decision making (see chart for details).    MDM Rules/Calculators/A&P                          Alert 45 year old female no acute distress, nontoxic-appearing.  Presents with complaints of insect sting.  Sting occurred approximately 30 minutes prior.  Patient denies any known history of bee sting allergies.  Patient denies any nausea, vomiting, diarrhea, shortness of breath, trouble swallowing, facial swelling.  Endorses rash to bilateral upper and lower arms as well as pruritus.  Patient has no hives, petechiae, or purpura noted to bilateral upper or lower extremities.  Multiple small red dots noted to bilateral upper and lower extremities.  Lungs clear to auscultation bilaterally.  Patient able speak in full pretenses without difficulty.  No swelling to face or oral mucosa.  No angioedema.  Patient able to handle oral secretions without difficulty.  Suspicion for anaphylactic reaction as time.  We will give patient Benadryl for her pruritus as well as NSAID and ice.  Plan to watch patient in emergency department after receiving Benadryl.  If stable will discharge patient with EpiPen.  On repeat examination patient continues to have no difficulty breathing or swallowing.  No angioedema noted.  Lungs clear to auscultation bilaterally.  No hives.  Will discharge patient at this time.  Final Clinical Impression(s) / ED Diagnoses Final diagnoses:  Bee sting, accidental or unintentional, initial encounter    Rx / DC Orders ED Discharge Orders     None         Dyann Ruddle 04/12/21 1319    Lacretia Leigh, MD 04/13/21 1505

## 2021-04-12 NOTE — ED Triage Notes (Signed)
Pt arrived via POV, stung by bee approx 75mns ago, no known allergy. Redness and swelling to left knee where stung.

## 2021-04-12 NOTE — Discharge Instructions (Addendum)
You came to the emergency department today to be evaluated for your insect stings.  Your physical exam was reassuring.  You have no signs of an anaphylactic allergic reaction.  If you continue to have generalized itching you can take sertraline 10 mg once daily.  You received Benadryl in the emergency department today.  This medication can cause sleepiness or drowsiness.  Please do not drive or operate heavy machinery for the next 12 to 24 hours.  Please take Ibuprofen (Advil, motrin) and Tylenol (acetaminophen) to relieve your pain.    You may take up to 600 MG (3 pills) of normal strength ibuprofen every 8 hours as needed.   You make take tylenol, up to 1,000 mg (two extra strength pills) every 8 hours as needed.   It is safe to take ibuprofen and tylenol at the same time as they work differently.   Do not take more than 3,000 mg tylenol in a 24 hour period (not more than one dose every 8 hours.  Please check all medication labels as many medications such as pain and cold medications may contain tylenol.  Do not drink alcohol while taking these medications.  Do not take other NSAID'S while taking ibuprofen (such as aleve or naproxen).  Please take ibuprofen with food to decrease stomach upset.  Get help right away if: You have symptoms of a severe allergic reaction. These include: Wheezing or difficulty breathing. Tightness in the chest or chest pain. Light-headedness or fainting. Itchy, raised, red patches on the skin. Nausea or vomiting. Abdominal cramping. Diarrhea. A swollen tongue or lips, or trouble swallowing. Dizziness or fainting.

## 2021-04-27 ENCOUNTER — Other Ambulatory Visit: Payer: Self-pay | Admitting: Neurology

## 2021-05-06 NOTE — Telephone Encounter (Signed)
Received (1) 200 unit vial of Botox today from [facility].

## 2021-05-13 ENCOUNTER — Ambulatory Visit: Payer: 59 | Admitting: Neurology

## 2021-05-19 ENCOUNTER — Ambulatory Visit (INDEPENDENT_AMBULATORY_CARE_PROVIDER_SITE_OTHER): Payer: 59 | Admitting: Neurology

## 2021-05-19 ENCOUNTER — Encounter: Payer: Self-pay | Admitting: Neurology

## 2021-05-19 VITALS — BP 112/71 | HR 92

## 2021-05-19 DIAGNOSIS — G43711 Chronic migraine without aura, intractable, with status migrainosus: Secondary | ICD-10-CM | POA: Diagnosis not present

## 2021-05-19 MED ORDER — OXYBUTYNIN CHLORIDE 5 MG PO TABS
5.0000 mg | ORAL_TABLET | Freq: Three times a day (TID) | ORAL | 1 refills | Status: DC
Start: 2021-05-19 — End: 2021-11-10

## 2021-05-19 NOTE — Progress Notes (Signed)
The patient is on oxybutynin taking 5 mg twice daily, she is still having a lot of urinary urgency and occasional incontinence.  We will increase the medication to 5 mg 3 times daily.  If she is still having problems with this, she is to contact our office.

## 2021-05-19 NOTE — Procedures (Signed)
     BOTOX PROCEDURE NOTE FOR MIGRAINE HEADACHE   HISTORY: [name] is a 45 year old patient with a history of intractable migraine headache.  The patient is getting Botox injections which have offered good benefit with her headaches.  She reports 6 or 7 severe headaches since last seen, most of them occurring within 2 weeks prior to the next injection.  She clearly believes that the Botox are offering her good benefit.   Description of procedure:  The patient was placed in a sitting position. The standard protocol was used for Botox as follows, with 5 units of Botox injected at each site:   -Procerus muscle, midline injection  -Corrugator muscle, bilateral injection  -Frontalis muscle, bilateral injection, with 2 sites each side, medial injection was performed in the upper one third of the frontalis muscle, in the region vertical from the medial inferior edge of the superior orbital rim. The lateral injection was again in the upper one third of the forehead vertically above the lateral limbus of the cornea, 1.5 cm lateral to the medial injection site.  -Temporalis muscle injection, 4 sites, bilaterally. The first injection was 3 cm above the tragus of the ear, second injection site was 1.5 cm to 3 cm up from the first injection site in line with the tragus of the ear. The third injection site was 1.5-3 cm forward between the first 2 injection sites. The fourth injection site was 1.5 cm posterior to the second injection site.  -Occipitalis muscle injection, 3 sites, bilaterally. The first injection was done one half way between the occipital protuberance and the tip of the mastoid process behind the ear. The second injection site was done lateral and superior to the first, 1 fingerbreadth from the first injection. The third injection site was 1 fingerbreadth superiorly and medially from the first injection site.  -Cervical paraspinal muscle injection, 2 sites, bilateral, the first  injection site was 1 cm from the midline of the cervical spine, 3 cm inferior to the lower border of the occipital protuberance. The second injection site was 1.5 cm superiorly and laterally to the first injection site.  -Trapezius muscle injection was performed at 3 sites, bilaterally. The first injection site was in the upper trapezius muscle halfway between the inflection point of the neck, and the acromion. The second injection site was one half way between the acromion and the first injection site. The third injection was done between the first injection site and the inflection point of the neck.   A 200 unit bottle of Botox was used, 155 units were injected, the rest of the Botox was wasted. The patient tolerated the procedure well, there were no complications of the above procedure.  Botox NDC 5883-2549-82 Lot number [id number] Expiration date January 2025

## 2021-06-10 ENCOUNTER — Other Ambulatory Visit: Payer: Self-pay | Admitting: Endocrinology

## 2021-06-11 ENCOUNTER — Other Ambulatory Visit: Payer: Self-pay | Admitting: Endocrinology

## 2021-06-11 ENCOUNTER — Other Ambulatory Visit: Payer: Self-pay | Admitting: Physician Assistant

## 2021-06-13 ENCOUNTER — Other Ambulatory Visit: Payer: Self-pay | Admitting: Endocrinology

## 2021-06-13 DIAGNOSIS — E221 Hyperprolactinemia: Secondary | ICD-10-CM

## 2021-06-14 ENCOUNTER — Ambulatory Visit
Admission: RE | Admit: 2021-06-14 | Discharge: 2021-06-14 | Disposition: A | Discharge status: Home / Self Care | Payer: 59 | Source: Ambulatory Visit | Attending: Endocrinology | Admitting: Endocrinology

## 2021-06-14 DIAGNOSIS — E221 Hyperprolactinemia: Secondary | ICD-10-CM

## 2021-06-14 MED ORDER — GADOBENATE DIMEGLUMINE 529 MG/ML IV SOLN
10.0000 mL | Freq: Once | INTRAVENOUS | Status: AC | PRN
  Administered 2021-06-14: 10 mL via INTRAVENOUS

## 2021-06-18 ENCOUNTER — Other Ambulatory Visit: Payer: Self-pay | Admitting: Endocrinology

## 2021-06-18 DIAGNOSIS — N6452 Nipple discharge: Secondary | ICD-10-CM

## 2021-06-18 DIAGNOSIS — N63 Unspecified lump in unspecified breast: Secondary | ICD-10-CM

## 2021-07-23 ENCOUNTER — Ambulatory Visit
Admission: RE | Admit: 2021-07-23 | Discharge: 2021-07-23 | Disposition: A | Discharge status: Home / Self Care | Payer: 59 | Source: Ambulatory Visit | Attending: Endocrinology | Admitting: Endocrinology

## 2021-07-23 ENCOUNTER — Other Ambulatory Visit: Payer: Self-pay

## 2021-07-23 DIAGNOSIS — N6452 Nipple discharge: Secondary | ICD-10-CM

## 2021-07-23 DIAGNOSIS — N63 Unspecified lump in unspecified breast: Secondary | ICD-10-CM

## 2021-09-02 ENCOUNTER — Encounter: Payer: Self-pay | Admitting: Neurology

## 2021-09-02 ENCOUNTER — Ambulatory Visit (INDEPENDENT_AMBULATORY_CARE_PROVIDER_SITE_OTHER): Payer: 59 | Admitting: Neurology

## 2021-09-02 VITALS — BP 135/78 | HR 78

## 2021-09-02 DIAGNOSIS — G43711 Chronic migraine without aura, intractable, with status migrainosus: Secondary | ICD-10-CM

## 2021-09-02 NOTE — Procedures (Signed)
     BOTOX PROCEDURE NOTE FOR MIGRAINE HEADACHE   HISTORY: [name] presents today for Botox for chronic migraine headaches.  Her last injection was in August 2022.  She has done well with Botox. After Botox has 90% improvement in headaches. Lately has been having near daily headache, is overdue for her Botox slightly.  Description of procedure:  The patient was placed in a sitting position. The standard protocol was used for Botox as follows, with 5 units of Botox injected at each site:   -Procerus muscle, midline injection  -Corrugator muscle, bilateral injection  -Frontalis muscle, bilateral injection, with 2 sites each side, medial injection was performed in the upper one third of the frontalis muscle, in the region vertical from the medial inferior edge of the superior orbital rim. The lateral injection was again in the upper one third of the forehead vertically above the lateral limbus of the cornea, 1.5 cm lateral to the medial injection site.  -Temporalis muscle injection, 4 sites, bilaterally. The first injection was 3 cm above the tragus of the ear, second injection site was 1.5 cm to 3 cm up from the first injection site in line with the tragus of the ear. The third injection site was 1.5-3 cm forward between the first 2 injection sites. The fourth injection site was 1.5 cm posterior to the second injection site.  -Occipitalis muscle injection, 3 sites, bilaterally. The first injection was done one half way between the occipital protuberance and the tip of the mastoid process behind the ear. The second injection site was done lateral and superior to the first, 1 fingerbreadth from the first injection. The third injection site was 1 fingerbreadth superiorly and medially from the first injection site.  -Cervical paraspinal muscle injection, 2 sites, bilateral, the first injection site was 1 cm from the midline of the cervical spine, 3 cm inferior to the lower border of the  occipital protuberance. The second injection site was 1.5 cm superiorly and laterally to the first injection site.  -Trapezius muscle injection was performed at 3 sites, bilaterally. The first injection site was in the upper trapezius muscle halfway between the inflection point of the neck, and the acromion. The second injection site was one half way between the acromion and the first injection site. The third injection was done between the first injection site and the inflection point of the neck.   A 200 unit bottle of Botox was used, 155 units were injected, the rest of the Botox was wasted. The patient tolerated the procedure well, there were no complications of the above procedure.  Botox NDC 0258-5277-82 Lot number [id number] Expiration date 11/2023 SP

## 2021-09-02 NOTE — Progress Notes (Signed)
Botox- 200 units x 1 vial Lot: [id number] Expiration:11/2023 NDC: 2370-2301-72   SP

## 2021-09-28 ENCOUNTER — Other Ambulatory Visit: Payer: Self-pay

## 2021-09-28 ENCOUNTER — Emergency Department (HOSPITAL_BASED_OUTPATIENT_CLINIC_OR_DEPARTMENT_OTHER): Payer: 59

## 2021-09-28 ENCOUNTER — Encounter (HOSPITAL_BASED_OUTPATIENT_CLINIC_OR_DEPARTMENT_OTHER): Payer: Self-pay | Admitting: Radiology

## 2021-09-28 DIAGNOSIS — L02211 Cutaneous abscess of abdominal wall: Secondary | ICD-10-CM | POA: Insufficient documentation

## 2021-09-28 DIAGNOSIS — R1012 Left upper quadrant pain: Secondary | ICD-10-CM | POA: Diagnosis present

## 2021-09-28 LAB — CBC WITH DIFFERENTIAL/PLATELET
Abs Immature Granulocytes: 0.02 10*3/uL (ref 0.00–0.07)
Basophils Absolute: 0 10*3/uL (ref 0.0–0.1)
Basophils Relative: 0 %
Eosinophils Absolute: 0.4 10*3/uL (ref 0.0–0.5)
Eosinophils Relative: 4 %
HCT: 36.6 % (ref 36.0–46.0)
Hemoglobin: 11.6 g/dL — ABNORMAL LOW (ref 12.0–15.0)
Immature Granulocytes: 0 %
Lymphocytes Relative: 28 %
Lymphs Abs: 2.7 10*3/uL (ref 0.7–4.0)
MCH: 29.9 pg (ref 26.0–34.0)
MCHC: 31.7 g/dL (ref 30.0–36.0)
MCV: 94.3 fL (ref 80.0–100.0)
Monocytes Absolute: 0.8 10*3/uL (ref 0.1–1.0)
Monocytes Relative: 9 %
Neutro Abs: 5.7 10*3/uL (ref 1.7–7.7)
Neutrophils Relative %: 59 %
Platelets: 306 10*3/uL (ref 150–400)
RBC: 3.88 MIL/uL (ref 3.87–5.11)
RDW: 14 % (ref 11.5–15.5)
WBC: 9.6 10*3/uL (ref 4.0–10.5)
nRBC: 0 % (ref 0.0–0.2)

## 2021-09-28 LAB — COMPREHENSIVE METABOLIC PANEL
ALT: 5 U/L (ref 0–44)
AST: 10 U/L — ABNORMAL LOW (ref 15–41)
Albumin: 4 g/dL (ref 3.5–5.0)
Alkaline Phosphatase: 63 U/L (ref 38–126)
Anion gap: 6 (ref 5–15)
BUN: 18 mg/dL (ref 6–20)
CO2: 26 mmol/L (ref 22–32)
Calcium: 9.1 mg/dL (ref 8.9–10.3)
Chloride: 104 mmol/L (ref 98–111)
Creatinine, Ser: 0.93 mg/dL (ref 0.44–1.00)
GFR, Estimated: >60 mL/min (ref >60)
Glucose, Bld: 84 mg/dL (ref 70–99)
Potassium: 3.8 mmol/L (ref 3.5–5.1)
Sodium: 136 mmol/L (ref 135–145)
Total Bilirubin: 0.3 mg/dL (ref 0.3–1.2)
Total Protein: 7.2 g/dL (ref 6.5–8.1)

## 2021-09-28 LAB — URINALYSIS, ROUTINE W REFLEX MICROSCOPIC
Bilirubin Urine: NEGATIVE
Glucose, UA: NEGATIVE mg/dL
Hgb urine dipstick: NEGATIVE
Ketones, ur: NEGATIVE mg/dL
Leukocytes,Ua: NEGATIVE
Nitrite: NEGATIVE
Specific Gravity, Urine: 1.022 (ref 1.005–1.030)
pH: 6 (ref 5.0–8.0)

## 2021-09-28 LAB — PREGNANCY, URINE: Preg Test, Ur: NEGATIVE

## 2021-09-28 MED ORDER — IOHEXOL 300 MG/ML  SOLN
100.0000 mL | Freq: Once | INTRAMUSCULAR | Status: AC | PRN
  Administered 2021-09-28: 100 mL via INTRAVENOUS

## 2021-09-28 NOTE — ED Triage Notes (Signed)
Pt states that she was lifting a case of water and felt a pop in her abd. Pt now has a bulge over her old feeding tube site. Site over bulge is red and warm now. Pt states she take PO dilaudid at home and that isnt taking the pain away.

## 2021-09-28 NOTE — ED Provider Notes (Signed)
Emergency Medicine Provider Triage Evaluation Note  [name] , a 46 y.o. female  was evaluated in triage.  Pt complains of abdominal pain and bulging.  Patient has a history of abdominal surgery.  She was lifting a case of water today and developed what appears to be an incisional hernia.  She has developed overlying erythema and tenderness.  No vomiting but is afraid to eat because of nausea.  Overlying the bulge, she has developed redness and warmth.  Review of Systems  Positive: Hernia Negative: Fever  Physical Exam  BP 135/86    Pulse 96    Temp 97.8 F (36.6 C)    Resp 16    Ht 5' 7"  (1.702 m)    Wt 95.7 kg    LMP 06/02/2017 Comment: hysterectomy 06/08/2017   SpO2 96%    BMI 33.05 kg/m  Gen:   Awake, no distress   Resp:  Normal effort  MSK:   Moves extremities without difficulty  Other:  Left upper abdominal tenderness with overlying erythema and warmth at site of healed surgical scar, concerning for incisional hernia  Medical Decision Making  Medically screening exam initiated at 9:34 PM.  Appropriate orders placed.  [name] was informed that the remainder of the evaluation will be completed by another provider, this initial triage assessment does not replace that evaluation, and the importance of remaining in the ED until their evaluation is complete.     Carlisle Cater, PA-C 09/28/21 2135    Fredia Sorrow, MD 10/09/21 2337

## 2021-09-29 ENCOUNTER — Emergency Department (HOSPITAL_BASED_OUTPATIENT_CLINIC_OR_DEPARTMENT_OTHER)
Admission: EM | Admit: 2021-09-29 | Discharge: 2021-09-29 | Disposition: A | Discharge status: Home / Self Care | Payer: 59 | Attending: Emergency Medicine | Admitting: Emergency Medicine

## 2021-09-29 DIAGNOSIS — L02211 Cutaneous abscess of abdominal wall: Secondary | ICD-10-CM

## 2021-09-29 MED ORDER — CLINDAMYCIN HCL 300 MG PO CAPS: 300.0000 mg | ORAL_CAPSULE | Freq: Four times a day (QID) | ORAL | 0 refills | Status: DC

## 2021-09-29 MED ORDER — LIDOCAINE HCL (PF) 1 % IJ SOLN
5.0000 mL | Freq: Once | INTRAMUSCULAR | Status: AC
  Administered 2021-09-29: 5 mL via INTRADERMAL
  Filled 2021-09-29: qty 5

## 2021-09-29 NOTE — ED Provider Notes (Signed)
[facility] EMERGENCY DEPT Provider Note   CSN: [id number] Arrival date & time: 09/28/21  2108     History  Chief Complaint  Patient presents with   Abdominal Pain    [name] is a 46 y.o. female.  Patient is a 46 year old female with past medical history of laparoscopic sleeve gastrectomy several years ago, total knee replacement, chronic headaches, and chronic pain syndrome.  Patient presenting today for evaluation of abdominal wall pain.  This has been worsening over the past 2 days.  She has pain to the left upper quadrant at the site of a previous PEG tube.  This tube was removed several years ago and has not given her trouble since.  She denies any fevers or chills.  She denies any bowel or bladder complaints.  Pain is worse with palpation with no alleviating factors.  The history is provided by the patient.      Home Medications Prior to Admission medications   Medication Sig Start Date End Date Taking? Authorizing Provider  albuterol (VENTOLIN HFA) 108 (90 Base) MCG/ACT inhaler Inhale 2 puffs into the lungs every 6 (six) hours as needed for wheezing or shortness of breath.    [provider]  baclofen (LIORESAL) 10 MG tablet TAKE 1 TABLET BY MOUTH EVERYDAY AT BEDTIME 12/18/20   Suzzanne Cloud, NP  Botulinum Toxin Type A (BOTOX) 200 units SOLR INJECT 155 UNITS  INTRAMUSCULARLY INTO HEAD  AND NECK MUSCLES EVERY 3  MONTHS (GIVEN AT MD OFFICE, DISCARD UNUSED) 02/10/21   Kathrynn Ducking, MD  buPROPion (WELLBUTRIN XL) 300 MG 24 hr tablet Take 300 mg by mouth every morning. 06/05/20   [provider]  buPROPion (ZYBAN) 150 MG 12 hr tablet bupropion HCl 150 mg tablet,12 hr sustained-release(smoking deterrent)  Take 1 tablet twice a day by oral route.    [provider]  busPIRone (BUSPAR) 10 MG tablet Take 10 mg by mouth 3 (three) times daily.    [provider]  celecoxib (CELEBREX) 200 MG capsule Take 1 capsule (200 mg total)  by mouth 2 (two) times daily. 01/24/20   Irving Copas, PA-C  Cholecalciferol (VITAMIN D3) 1.25 MG (50000 UT) CAPS Take 50,000 Units by mouth every 7 (seven) days.  04/03/19   [provider]  cyanocobalamin (,VITAMIN B-12,) 1000 MCG/ML injection Inject 1,000 mcg into the muscle 2 (two) times a week.  04/06/19   [provider]  diclofenac Sodium (VOLTAREN) 1 % GEL Apply topically 4 (four) times daily.    [provider]  dicyclomine (BENTYL) 10 MG capsule Take 10 mg by mouth 2 (two) times daily as needed. 12/17/20   [provider]  DULoxetine (CYMBALTA) 60 MG capsule Take 1 capsule (60 mg total) by mouth 2 (two) times daily. 04/10/19   Connye Burkitt, NP  EPINEPHrine 0.3 mg/0.3 mL IJ SOAJ injection Inject 0.3 mg into the muscle as needed for anaphylaxis. 04/12/21   Loni Beckwith, PA-C  ferrous sulfate 325 (65 FE) MG tablet Take 1 tablet (325 mg total) by mouth 2 (two) times daily with a meal. 01/24/20   Irving Copas, PA-C  fluticasone (FLONASE) 50 MCG/ACT nasal spray Place 2 sprays into both nostrils daily as needed for allergies or rhinitis.    [provider]  HYDROmorphone (DILAUDID) 4 MG tablet Take 1-2 tablets (4-8 mg total) by mouth every 4 (four) hours as needed for moderate pain or severe pain. Patient taking differently: Take 1-2 tablets (4-8  mg total) by mouth every 3 hours as needed for moderate pain or severe pain. 01/24/20   Irving Copas, PA-C  ibuprofen (ADVIL) 600 MG tablet Take 1 tablet (600 mg total) by mouth every 6 (six) hours as needed. 11/27/20   Lacretia Leigh, MD  linaclotide [facility]) 290 MCG CAPS capsule Take 290 mcg by mouth daily before breakfast.    [provider]  mesalamine (LIALDA) 1.2 g EC tablet Lialda 1.2 gram tablet,delayed release    [provider]  methocarbamol (ROBAXIN) 500 MG tablet Take 500 mg by mouth every 8 (eight) hours as needed for muscle spasms.     [provider]   metoCLOPramide (REGLAN) 10 MG tablet TAKE 1 TABLET (10 MG TOTAL) BY MOUTH 3 (THREE) TIMES DAILY BEFORE MEALS. 01/15/20   Gatha Mayer, MD  Multiple Vitamin (MULTIVITAMIN WITH MINERALS) TABS tablet Take 1 tablet by mouth daily.    [provider]  NARCAN 4 MG/0.1ML LIQD nasal spray kit 1 spray as directed. 03/18/20   [provider]  ondansetron (ZOFRAN) 8 MG tablet Take 8 mg by mouth every 8 (eight) hours as needed for nausea or vomiting.    [provider]  oxybutynin (DITROPAN) 5 MG tablet Take 1 tablet (5 mg total) by mouth 3 (three) times daily. 05/19/21   Kathrynn Ducking, MD  pantoprazole (PROTONIX) 40 MG tablet TAKE 1 TABLET BY MOUTH TWICE A DAY BEFORE A MEAL 02/22/20   Gatha Mayer, MD  sucralfate (CARAFATE) 1 g tablet Take 2 g by mouth 2 (two) times daily. 06/12/20   [provider]  SYMPROIC 0.2 MG TABS Take 1 tablet by mouth daily. 05/13/21   [provider]  thiamine (VITAMIN B-1) 100 MG tablet Take 100 mg by mouth daily at 12 noon.     [provider]      Allergies    Coconut oil, Morphine and related, Oxycodone, Ciprofloxacin, Trazodone and nefazodone, Vistaril [hydroxyzine hcl], Doxycycline, Adhesive [tape], and Penicillins    Review of Systems   Review of Systems  All other systems reviewed and are negative.  Physical Exam Updated Vital Signs BP 130/82 (BP Location: Right Arm)    Pulse 88    Temp 97.8 F (36.6 C)    Resp 20    Ht _0  (1.702 m)    Wt 95.7 kg    LMP 06/02/2017 Comment: hysterectomy 06/08/2017   SpO2 100%    BMI 33.05 kg/m  Physical Exam Vitals and nursing note reviewed.  Constitutional:      General: She is not in acute distress.    Appearance: She is well-developed. She is not diaphoretic.  HENT:     Head: Normocephalic and atraumatic.  Cardiovascular:     Rate and Rhythm: Normal rate and regular rhythm.     Heart sounds: No murmur heard.   No friction rub. No gallop.  Pulmonary:     Effort:  Pulmonary effort is normal. No respiratory distress.     Breath sounds: Normal breath sounds. No wheezing.  Abdominal:     General: Bowel sounds are normal. There is no distension.     Palpations: Abdomen is soft.     Tenderness: There is no abdominal tenderness.     Comments: To the left upper quadrant, there is a previous incision noted.  Underlying this incision there is a fluctuant, palpable, tender area with mild overlying erythema.  It is tender to palpation.  The remainder of the abdominal  exam is otherwise unremarkable.  Musculoskeletal:        General: Normal range of motion.     Cervical back: Normal range of motion and neck supple.  Skin:    General: Skin is warm and dry.  Neurological:     General: No focal deficit present.     Mental Status: She is alert and oriented to person, place, and time.    ED Results / Procedures / Treatments   Labs (all labs ordered are listed, but only abnormal results are displayed) Labs Reviewed  COMPREHENSIVE METABOLIC PANEL - Abnormal; Notable for the following components:      Result Value   AST 10 (*)    All other components within normal limits  CBC WITH DIFFERENTIAL/PLATELET - Abnormal; Notable for the following components:   Hemoglobin 11.6 (*)    All other components within normal limits  URINALYSIS, ROUTINE W REFLEX MICROSCOPIC - Abnormal; Notable for the following components:   Protein, ur TRACE (*)    All other components within normal limits  PREGNANCY, URINE    EKG None  Radiology CT ABDOMEN PELVIS W CONTRAST  Result Date: 09/29/2021 CLINICAL DATA:  Evaluate for ventral hernia. EXAM: CT ABDOMEN AND PELVIS WITH CONTRAST TECHNIQUE: Multidetector CT imaging of the abdomen and pelvis was performed using the standard protocol following bolus administration of intravenous contrast. CONTRAST:  125m OMNIPAQUE IOHEXOL 300 MG/ML  SOLN COMPARISON:  December 11, 2020 FINDINGS: Lower chest: No acute abnormality. Hepatobiliary: No focal  liver abnormality is seen. No gallstones, gallbladder wall thickening, or biliary dilatation. Pancreas: Unremarkable. No pancreatic ductal dilatation or surrounding inflammatory changes. Spleen: A stable 2.2 cm x 1.9 cm cystic appearing structure is seen within the anterior aspect of the spleen. Adrenals/Urinary Tract: Adrenal glands are unremarkable. Kidneys are normal, without renal calculi, focal lesion, or hydronephrosis. Bladder is unremarkable. Stomach/Bowel: Surgical sutures are seen within the gastric region. Surgically anastomosed bowel is also seen within the anterior aspect of the mid left abdomen. Appendix appears normal. No evidence of bowel wall thickening, distention, or inflammatory changes. Vascular/Lymphatic: No significant vascular findings are present. No enlarged abdominal or pelvic lymph nodes. Reproductive: Status post hysterectomy. No adnexal masses. Other: A 3.0 cm x 2.0 cm fat containing ventral hernia is seen along the midline of the mid to upper abdomen. Adjacent 5.3 cm x 2.0 cm x 2.6 cm and 1.5 cm x 0.8 cm x 0.8 cm areas of low attenuation, with thin hyperdense rim, are seen within the deep subcutaneous fat of the mid to upper anterolateral abdominal wall on the left. There is a moderate to marked amount of surrounding inflammatory fat stranding. No abdominopelvic ascites. No abdominopelvic ascites. Musculoskeletal: No acute or significant osseous findings. IMPRESSION: 1. Small abscesses within the deep subcutaneous fat of the mid to upper anterolateral abdominal wall on the left, as described above. 2. 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 3. Evidence of prior gastric and small bowel surgery. Electronically Signed   By: TVirgina NorfolkM.D.   On: 09/29/2021 00:05    Procedures Procedures    Medications Ordered in ED Medications  iohexol (OMNIPAQUE) 300 MG/ML solution 100 mL (100 mLs Intravenous Contrast Given 09/28/21 2339)    ED Course/ Medical Decision Making/  A&P  This patient presents to the ED for concern of abdominal wall pain, this involves an extensive number of treatment options, and is a complaint that carries with it a high risk of complications and morbidity.  The differential diagnosis includes  abscess, infection, acute intra-abdominal pathology   Co morbidities that complicate the patient evaluation  None   Additional history obtained:  Additional history obtained from N/A External records from outside source obtained and reviewed including N/A   Lab Tests:  I Ordered, and personally interpreted labs.  The pertinent results include: Complete blood count, complete metabolic panel, and lipase.  These were all essentially unremarkable.  Urinalysis and pregnancy test also both unremarkable.   Imaging Studies ordered:  I ordered imaging studies including CT scan of the abdomen and pelvis I independently visualized and interpreted imaging which showed abdominal wall abscess to the left upper quadrant at the previous PEG tube site. I agree with the radiologist interpretation   Cardiac Monitoring:  The patient was maintained on a cardiac monitor.  I personally viewed and interpreted the cardiac monitored which showed an underlying rhythm of: N/A   Medicines ordered and prescription drug management:  I ordered medication including antibiotics/clindamycin for abdominal wall abscess Reevaluation of the patient after these medicines showed that the patient improved I have reviewed the patients home medicines and have made adjustments as needed   Test Considered:  No other test considered or indicated   Critical Interventions:  Incision and drainage   Consultations Obtained:  I requested consultation with the general surgeon, Dr. Donne Hazel,  and discussed lab and imaging findings as well as pertinent plan - they recommend: Incision and drainage of the abscess along with antibiotics   Problem List / ED  Course:  Patient presenting here with complaints of abdominal pain that appears related to an abdominal wall abscess that is in the site of the previous PEG tube she had removed over 2 years ago.  This was confirmed with CT scan showing no involvement of the internal organs.  I discussed this with Dr. Donne Hazel from general surgery and he felt as though it was safe to proceed with incision and drainage.  This was performed as below and patient tolerated this procedure well.  She will be discharged with clindamycin after receiving clindamycin intravenously here in the ER.  She will also be advised to perform warm compresses and return as needed if symptoms worsen.   Reevaluation:  After the interventions noted above, I reevaluated the patient and found that they have :improved   Social Determinants of Health:  N/A   Dispostion:  After consideration of the diagnostic results and the patients response to treatment, I feel that the patent would benefit from discharge to home.  She will be given clindamycin and advised to perform warm compresses.  She is to return as needed if symptoms worsen.  INCISION AND DRAINAGE Performed by: Veryl Speak Consent: Verbal consent obtained. Risks and benefits: risks, benefits and alternatives were discussed Type: abscess  Body area: Abdominal wall  Anesthesia: local infiltration  Incision was made with a scalpel.  Local anesthetic: lidocaine 1% without epinephrine  Anesthetic total: 4 ml  Complexity: complex Blunt dissection to break up loculations  Drainage: purulent  Drainage amount: Moderate  Packing material: No packing placed  Patient tolerance: Patient tolerated the procedure well with no immediate complications.      Final Clinical Impression(s) / ED Diagnoses Final diagnoses:  None    Rx / DC Orders ED Discharge Orders     None         Veryl Speak, MD 09/29/21 [phone]

## 2021-09-29 NOTE — Discharge Instructions (Signed)
Begin taking clindamycin as prescribed.  Apply warm compresses as frequently as possible for the next several days.  Return to the emergency department if you develop increased pain, increased swelling, redness extending from the wound, high fevers, or other new and concerning symptoms.

## 2021-11-06 ENCOUNTER — Telehealth: Payer: Self-pay | Admitting: Neurology

## 2021-11-06 NOTE — Telephone Encounter (Signed)
I called [facility] spoke with Brandon(reference # [id number]) pharmacist to call in patients refill for Botox. Will reach out to patient to get consent.

## 2021-11-10 ENCOUNTER — Other Ambulatory Visit: Payer: Self-pay

## 2021-11-10 MED ORDER — OXYBUTYNIN CHLORIDE 5 MG PO TABS: 5.0000 mg | ORAL_TABLET | Freq: Three times a day (TID) | ORAL | 1 refills | Status: DC

## 2021-11-10 NOTE — Progress Notes (Signed)
Rx refilled.

## 2021-11-24 MED ORDER — BOTOX 200 UNITS IJ SOLR: INTRAMUSCULAR | 3 refills | Status: DC

## 2021-11-24 NOTE — Addendum Note (Signed)
Addended by: Noberto Retort C on: 11/24/2021 11:42 AM   Modules accepted: Orders

## 2021-11-24 NOTE — Telephone Encounter (Signed)
Refills have been sent to pharmacy.

## 2021-11-24 NOTE — Telephone Encounter (Signed)
Please send Botox refill to [facility].

## 2021-11-25 NOTE — Telephone Encounter (Signed)
I called [facility] spoke [name] states pt needs to call here insurance and update her coordination of benefits before they can review the medication refill.

## 2021-11-27 NOTE — Telephone Encounter (Signed)
Received (1)  200 unit vial of Botox from [facility]. ?

## 2021-12-02 ENCOUNTER — Telehealth: Payer: Self-pay | Admitting: Neurology

## 2021-12-02 ENCOUNTER — Ambulatory Visit: Payer: 59 | Admitting: Neurology

## 2021-12-02 NOTE — Telephone Encounter (Signed)
I called patient, lm to get appt rescheduled due to provider leaving early. ?

## 2021-12-18 ENCOUNTER — Ambulatory Visit: Payer: 59 | Admitting: Neurology

## 2021-12-18 VITALS — Ht 67.0 in | Wt 209.0 lb

## 2021-12-18 DIAGNOSIS — G43711 Chronic migraine without aura, intractable, with status migrainosus: Secondary | ICD-10-CM | POA: Diagnosis not present

## 2021-12-18 NOTE — Procedures (Signed)
? ? ? ?  BOTOX PROCEDURE NOTE FOR MIGRAINE HEADACHE ? ? ?HISTORY: [name] is here today for Botox injection for chronic migraine headache.  Last injection was in December 2022.  Has great benefit from Botox injection.  This last cycle, reports 75%.  She has had a few more headaches as result of stress.  She has tolerated Botox well. ? ? ?Description of procedure: ? ?The patient was placed in a sitting position. The standard protocol was used for Botox as follows, with 5 units of Botox injected at each site: ? ? ?-Procerus muscle, midline injection ? ?-Corrugator muscle, bilateral injection ? ?-Frontalis muscle, bilateral injection, with 2 sites each side, medial injection was performed in the upper one third of the frontalis muscle, in the region vertical from the medial inferior edge of the superior orbital rim. The lateral injection was again in the upper one third of the forehead vertically above the lateral limbus of the cornea, 1.5 cm lateral to the medial injection site. ? ?-Temporalis muscle injection, 4 sites, bilaterally. The first injection was 3 cm above the tragus of the ear, second injection site was 1.5 cm to 3 cm up from the first injection site in line with the tragus of the ear. The third injection site was 1.5-3 cm forward between the first 2 injection sites. The fourth injection site was 1.5 cm posterior to the second injection site. ? ?-Occipitalis muscle injection, 3 sites, bilaterally. The first injection was done one half way between the occipital protuberance and the tip of the mastoid process behind the ear. The second injection site was done lateral and superior to the first, 1 fingerbreadth from the first injection. The third injection site was 1 fingerbreadth superiorly and medially from the first injection site. ? ?-Cervical paraspinal muscle injection, 2 sites, bilateral, the first injection site was 1 cm from the midline of the cervical spine, 3 cm inferior to the lower border of the  occipital protuberance. The second injection site was 1.5 cm superiorly and laterally to the first injection site. ? ?-Trapezius muscle injection was performed at 3 sites, bilaterally. The first injection site was in the upper trapezius muscle halfway between the inflection point of the neck, and the acromion. The second injection site was one half way between the acromion and the first injection site. The third injection was done between the first injection site and the inflection point of the neck. ? ? ?A 200 unit bottle of Botox was used, 155 units were injected, the rest of the Botox was wasted. The patient tolerated the procedure well, there were no complications of the above procedure. ? ?Botox NDC [phone] ?Lot number [id number] ?Expiration date 05/2024 ?SP ? ? ?

## 2021-12-18 NOTE — Progress Notes (Signed)
Botox 200 units ?[id number] ?[id number] ?Exp-05/2024 ?SP ?

## 2021-12-18 NOTE — Progress Notes (Signed)
Please see botox procedure note ?

## 2021-12-31 ENCOUNTER — Ambulatory Visit: Payer: 59 | Admitting: Neurology

## 2022-01-13 DIAGNOSIS — Z960 Presence of urogenital implants: Secondary | ICD-10-CM | POA: Insufficient documentation

## 2022-03-17 NOTE — Telephone Encounter (Signed)
Received (1) 200 unit vial of Botox from [facility] SP. ?

## 2022-03-19 ENCOUNTER — Ambulatory Visit (INDEPENDENT_AMBULATORY_CARE_PROVIDER_SITE_OTHER): Payer: 59 | Admitting: Neurology

## 2022-03-19 DIAGNOSIS — G43711 Chronic migraine without aura, intractable, with status migrainosus: Secondary | ICD-10-CM | POA: Diagnosis not present

## 2022-03-19 NOTE — Procedures (Signed)
     BOTOX PROCEDURE NOTE FOR MIGRAINE HEADACHE   HISTORY: [name] is here today for Botox injection.  She has had excellent benefit.  She has had no adverse effects.   Description of procedure:  The patient was placed in a sitting position. The standard protocol was used for Botox as follows, with 5 units of Botox injected at each site:   -Procerus muscle, midline injection  -Corrugator muscle, bilateral injection  -Frontalis muscle, bilateral injection, with 2 sites each side, medial injection was performed in the upper one third of the frontalis muscle, in the region vertical from the medial inferior edge of the superior orbital rim. The lateral injection was again in the upper one third of the forehead vertically above the lateral limbus of the cornea, 1.5 cm lateral to the medial injection site.  -Temporalis muscle injection, 4 sites, bilaterally. The first injection was 3 cm above the tragus of the ear, second injection site was 1.5 cm to 3 cm up from the first injection site in line with the tragus of the ear. The third injection site was 1.5-3 cm forward between the first 2 injection sites. The fourth injection site was 1.5 cm posterior to the second injection site.  -Occipitalis muscle injection, 3 sites, bilaterally. The first injection was done one half way between the occipital protuberance and the tip of the mastoid process behind the ear. The second injection site was done lateral and superior to the first, 1 fingerbreadth from the first injection. The third injection site was 1 fingerbreadth superiorly and medially from the first injection site.  -Cervical paraspinal muscle injection, 2 sites, bilateral, the first injection site was 1 cm from the midline of the cervical spine, 3 cm inferior to the lower border of the occipital protuberance. The second injection site was 1.5 cm superiorly and laterally to the first injection site.  -Trapezius muscle injection was performed at 3  sites, bilaterally. The first injection site was in the upper trapezius muscle halfway between the inflection point of the neck, and the acromion. The second injection site was one half way between the acromion and the first injection site. The third injection was done between the first injection site and the inflection point of the neck.   A 200 unit bottle of Botox was used, 155 units were injected, the rest of the Botox was wasted. The patient tolerated the procedure well, there were no complications of the above procedure.  Botox NDC 1497-0263-78 Lot number [id number] Expiration date 10/2024 SP

## 2022-03-19 NOTE — Progress Notes (Signed)
Patient: [name] Date of Birth: [birth year]  Reason for Visit: Follow up for migraines  History from: Patient Primary Neurologist: Dr.Willis/Dr. Jaynee Eagles  ASSESSMENT AND PLAN 46 y.o. year old female   1.  Chronic migraine headache -Under excellent control with Botox, at least 80% benefit, we will continue this -No longer receiving any medications from our office 2.  Chronic pain -Under care of pain management, on Dilaudid  HISTORY OF PRESENT ILLNESS: Today 03/19/22 [name] is here today for follow-up. Had medtronic bladder stimulator, is working great, no longer on oxybutynin.  Here today for botox for migraines, has 80% improvement. With Botox for 2 months has no migraines. Has depression, has been referred to psychiatry for a new opinion. Goes to pain management, gets Dilaudid for back, neck, shoulder pain.   HISTORY  12/18/2020 SS: [name] is a 46 year old female with history of intractable headache. She remains on Botox therapy, last in January 2022. Had a fall on 11/27/20, standing on her son's bed, lost her balance, fell into the wall, reports LOC. She went to ER, she had vomiting that night, CT head, CT cervical spine no acute abnormalities. Since then, having more frequent migraines, nearing time for next Botox. Has been taking baclofen 20 mg at bedtime to help with headaches, helping to fall asleep. Next Botox is Aprill 11th, has done well, clear benefit. Only a few migraines a month with Botox. Considering plastic surgery to abdomen and liposuction. Has been dealing with Depression, worsening lately, moved to a bigger place. Anxious today about coming out in rain, sees psych.  Had MRI of the lumbar spine in February, showed mild degenerative changes.  Her PCP does her pain management, takes Robaxin, and Dilaudid.   REVIEW OF SYSTEMS: Out of a complete 14 system review of symptoms, the patient complains only of the following symptoms, and all other reviewed  systems are negative.  See HPI  ALLERGIES: Allergies  Allergen Reactions   Coconut (Cocos Nucifera) Anaphylaxis and Itching   Morphine And Related Anaphylaxis and Hives    Pt states that she has tolerated Norco and Dilaudid   Oxycodone Anaphylaxis   Ciprofloxacin Itching and Rash   Trazodone And Nefazodone Rash   Vistaril [Hydroxyzine Hcl] Hives   Doxycycline Nausea And Vomiting   Adhesive [Tape] Rash    And paper tape causes a rash if wearing for a prolong period of time   Penicillins Rash    Has patient had a PCN reaction causing immediate rash, facial/tongue/throat swelling, SOB or lightheadedness with hypotension: Yes Has patient had a PCN reaction causing severe rash involving mucus membranes or skin necrosis: No Has patient had a PCN reaction that required hospitalization No Has patient had a PCN reaction occurring within the last 10 years: No If all of the above answers are "NO", then may proceed with Cephalosporin use.    HOME MEDICATIONS: Outpatient Medications Prior to Visit  Medication Sig Dispense Refill   albuterol (VENTOLIN HFA) 108 (90 Base) MCG/ACT inhaler Inhale 2 puffs into the lungs every 6 (six) hours as needed for wheezing or shortness of breath.     baclofen (LIORESAL) 10 MG tablet TAKE 1 TABLET BY MOUTH EVERYDAY AT BEDTIME 90 tablet 1   Botulinum Toxin Type A (BOTOX) 200 units SOLR INJECT 155 UNITS  INTRAMUSCULARLY INTO HEAD  AND NECK MUSCLES EVERY 3  MONTHS (GIVEN AT MD OFFICE, DISCARD UNUSED) 1 each 3   buPROPion (WELLBUTRIN XL) 300 MG 24 hr tablet  Take 300 mg by mouth every morning.     buPROPion (ZYBAN) 150 MG 12 hr tablet bupropion HCl 150 mg tablet,12 hr sustained-release(smoking deterrent)  Take 1 tablet twice a day by oral route.     busPIRone (BUSPAR) 10 MG tablet Take 10 mg by mouth 3 (three) times daily.     celecoxib (CELEBREX) 200 MG capsule Take 1 capsule (200 mg total) by mouth 2 (two) times daily. 60 capsule 0   Cholecalciferol (VITAMIN D3)  1.25 MG (50000 UT) CAPS Take 50,000 Units by mouth every 7 (seven) days.      clindamycin (CLEOCIN) 300 MG capsule Take 1 capsule (300 mg total) by mouth 4 (four) times daily. X 7 days 28 capsule 0   cyanocobalamin (,VITAMIN B-12,) 1000 MCG/ML injection Inject 1,000 mcg into the muscle 2 (two) times a week.      diclofenac Sodium (VOLTAREN) 1 % GEL Apply topically 4 (four) times daily.     dicyclomine (BENTYL) 10 MG capsule Take 10 mg by mouth 2 (two) times daily as needed.     DULoxetine (CYMBALTA) 60 MG capsule Take 1 capsule (60 mg total) by mouth 2 (two) times daily. 60 capsule 0   EPINEPHrine 0.3 mg/0.3 mL IJ SOAJ injection Inject 0.3 mg into the muscle as needed for anaphylaxis. 1 each 0   ferrous sulfate 325 (65 FE) MG tablet Take 1 tablet (325 mg total) by mouth 2 (two) times daily with a meal. 28 tablet 3   fluticasone (FLONASE) 50 MCG/ACT nasal spray Place 2 sprays into both nostrils daily as needed for allergies or rhinitis.     HYDROmorphone (DILAUDID) 4 MG tablet Take 1-2 tablets (4-8 mg total) by mouth every 4 (four) hours as needed for moderate pain or severe pain. (Patient taking differently: Take 1-2 tablets (4-8 mg total) by mouth every 3 hours as needed for moderate pain or severe pain.)  0   ibuprofen (ADVIL) 600 MG tablet Take 1 tablet (600 mg total) by mouth every 6 (six) hours as needed. 30 tablet 0   linaclotide (LINZESS) 290 MCG CAPS capsule Take 290 mcg by mouth daily before breakfast.     mesalamine (LIALDA) 1.2 g EC tablet Lialda 1.2 gram tablet,delayed release     methocarbamol (ROBAXIN) 500 MG tablet Take 500 mg by mouth every 8 (eight) hours as needed for muscle spasms.      metoCLOPramide (REGLAN) 10 MG tablet TAKE 1 TABLET (10 MG TOTAL) BY MOUTH 3 (THREE) TIMES DAILY BEFORE MEALS. 90 tablet 0   Multiple Vitamin (MULTIVITAMIN WITH MINERALS) TABS tablet Take 1 tablet by mouth daily.     NARCAN 4 MG/0.1ML LIQD nasal spray kit 1 spray as directed.     ondansetron  (ZOFRAN) 8 MG tablet Take 8 mg by mouth every 8 (eight) hours as needed for nausea or vomiting.     oxybutynin (DITROPAN) 5 MG tablet Take 1 tablet (5 mg total) by mouth 3 (three) times daily. 270 tablet 1   pantoprazole (PROTONIX) 40 MG tablet TAKE 1 TABLET BY MOUTH TWICE A DAY BEFORE A MEAL 180 tablet 1   sucralfate (CARAFATE) 1 g tablet Take 2 g by mouth 2 (two) times daily.     SYMPROIC 0.2 MG TABS Take 1 tablet by mouth daily.     thiamine (VITAMIN B-1) 100 MG tablet Take 100 mg by mouth daily at 12 noon.      No facility-administered medications prior to visit.    PAST MEDICAL HISTORY:  Past Medical History:  Diagnosis Date   Anal fissure - posterior 10/16/2014    OCC  ISSUES   Anxiety    doesn't take anything   Asthma    has Albuterol inhaler as needed   Boil    on pubic area started septra 03-01-17 draining blood and pus   Bronchitis    Chronic headache disorder 07/22/2016   Clostridium difficile infection 04/20/2012   Colitis    Dehydration    Depression    Family history of adverse reaction to anesthesia    pt mom gets sick   GERD (gastroesophageal reflux disease)    takes Pantoprazole daily   Headache    Hiatal hernia    neuropathy - mild in arms and legs   History of blood transfusion    last transfusion was 04/04/2016=Benadryl was given d/t itching. States she always itches with transfusion.    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   Marginal ulcer 10/25/2017   Migraine    Motion sickness    Nausea    takes Zofran as needed   Nausea and vomiting    for 1 year   Obesity    Oligouria    Osteoarthritis    lower back, knees, wrists  - no meds   Peripheral neuropathy 03/01/2019   Pneumonia 1997   Postoperative nausea and vomiting 01/21/2016   wants scopolamine patch   Right carpal tunnel syndrome 03/01/2019   SVD (spontaneous vaginal delivery)    x 4   Tingling    BOTH LOWER EXTREMETIES ALL THE TIME DUE TO RAPID WEIGHT LOSS   TOBACCO USER 10/02/2009   Qualifier: Diagnosis of  By: Dimas Millin MD, Ellard Artis     Transfusion history    last transfusion 6'16    Tremor 08/31/2018   UC (ulcerative colitis) ([facility])    supposed to be taking Lialda and Bentyl but has been off since gastric sleeve   Vertigo    doesn't take any meds    PAST SURGICAL HISTORY: Past Surgical History:  Procedure Laterality Date   ABDOMINAL HYSTERECTOMY     PARTIAL   COLONOSCOPY  2007   for rectal bleeding; [facility]   COLONOSCOPY N/A 06/11/2014   Procedure: COLONOSCOPY;  Surgeon: Gatha Mayer, MD;  Location: [facility];  Service: Endoscopy;  Laterality: N/A;   COLONOSCOPY WITH PROPOFOL N/A 03/02/2017   Procedure: COLONOSCOPY WITH PROPOFOL;  Surgeon: Alphonsa Overall, MD;  Location: [facility];  Service: General;  Laterality: N/A;   DILATION AND CURETTAGE OF UTERUS     ESOPHAGOGASTRODUODENOSCOPY (EGD) WITH PROPOFOL N/A 04/06/2016   Procedure: ESOPHAGOGASTRODUODENOSCOPY (EGD) WITH PROPOFOL;  Surgeon: Alphonsa Overall, MD;  Location: [facility];  Service: General;  Laterality: N/A;   ESOPHAGOGASTRODUODENOSCOPY (EGD) WITH PROPOFOL N/A 03/02/2017   Procedure: ESOPHAGOGASTRODUODENOSCOPY (EGD) WITH PROPOFOL;  Surgeon: Alphonsa Overall, MD;  Location: [facility];  Service: General;  Laterality: N/A;   ESOPHAGOGASTRODUODENOSCOPY (EGD) WITH PROPOFOL N/A 05/20/2017   Procedure: ESOPHAGOGASTRODUODENOSCOPY (EGD) WITH PROPOFOL ERAS PATHWAY;  Surgeon: Alphonsa Overall, MD;  Location: [facility];  Service:  General;  Laterality: N/A;   ESOPHAGOGASTRODUODENOSCOPY (EGD) WITH PROPOFOL N/A 10/07/2017   Procedure: ESOPHAGOGASTRODUODENOSCOPY (EGD) WITH PROPOFOL;  Surgeon: Alphonsa Overall, MD;   Location: [facility];  Service: General;  Laterality: N/A;   EXCISION OF SKIN TAG  06/08/2017   Procedure: EXCISION OF VULVAR SKIN TAGS X2;  Surgeon: [name], MD;  Location: [facility];  Service: Gynecology;;   FOOT SURGERY Bilateral    x 2   GASTRIC ROUX-EN-Y N/A 12/14/2016   Procedure: LAPAROSCOPIC REVISION SLEEVE GASTRECTOMY TO  ROUX-Y-GASTRIC BY-PASS, UPPER ENDO;  Surgeon: Excell Seltzer, MD;  Location: [facility];  Service: General;  Laterality: N/A;   GASTROJEJUNOSTOMY N/A 05/11/2016   Procedure: LAPAROSCOPIC PLACEMENT  OF FEEDING  JEJUNOSTOMY TUBE;  Surgeon: Excell Seltzer, MD;  Location: [facility];  Service: General;  Laterality: N/A;   HEMORRHOIDECTOMY WITH HEMORRHOID BANDING     IR FLUORO GUIDE CV LINE RIGHT  04/06/2017   IR GENERIC HISTORICAL  05/19/2016   IR CM INJ ANY COLONIC TUBE W/FLUORO 05/19/2016 Aletta Edouard, MD [facility]   IR PATIENT EVAL TECH 0-60 MINS  11/30/2017   IR [place] DUODEN/JEJUNO TUBE PERCUT W/FLUORO  03/14/2018   IR [place] DUODEN/JEJUNO TUBE PERCUT W/FLUORO  03/15/2018   IR US GUIDE VASC ACCESS RIGHT  04/06/2017   j tube removed march 2018     KNEE ARTHROSCOPY Left 09/06/2014   LAPAROSCOPIC GASTRIC SLEEVE RESECTION N/A 01/14/2016   Procedure: LAPAROSCOPIC GASTRIC SLEEVE RESECTION;  Surgeon: Excell Seltzer, MD;  Location: [facility];  Service: General;  Laterality: N/A;   LAPAROSCOPIC REVISION OF GASTROJEJUNOSTOMY N/A 10/25/2017   Procedure: LAPAROSCOPIC REVISION OF GASTROJEJUNOSTOMY AND PARTIAL GASTRECTOMY, WITH PLACEMENT OF FEEDING GASTROSTOMY TUBE;  Surgeon: Excell Seltzer, MD;  Location: [facility];  Service: General;  Laterality: N/A;   LAPAROSCOPIC TUBAL LIGATION  10/16/2011   Procedure: LAPAROSCOPIC TUBAL LIGATION;  Surgeon: Alwyn Pea, MD;  Location: [facility];  Service: Gynecology;  Laterality: Bilateral;   NOVASURE ABLATION  09/28/2010   mild persistent vaginal bleeding   right knee arthroscopy     05/07/2016   TOTAL KNEE ARTHROPLASTY Left 03/10/2018    Procedure: LEFT TOTAL KNEE ARTHROPLASTY;  Surgeon: Paralee Cancel, MD;  Location: [facility];  Service: Orthopedics;  Laterality: Left;  70 mins   TOTAL KNEE ARTHROPLASTY Right 01/23/2020   Procedure: TOTAL KNEE ARTHROPLASTY, BILATERAL TROCHANTERIC INJECTION;  Surgeon: Paralee Cancel, MD;  Location: [facility];  Service: Orthopedics;  Laterality: Right;  70 mins for Bilateral Troch injection 1 cc Depo and 2 CC Lidocaine   TUBAL LIGATION     VAGINAL HYSTERECTOMY Bilateral 06/08/2017   Procedure: HYSTERECTOMY VAGINAL W/ BILATERAL SALPINGECTOMY;  Surgeon: [name], MD;  Location: [facility];  Service: Gynecology;  Laterality: Bilateral;   wisdom teeth extracted      FAMILY HISTORY: Family History  Problem Relation Age of Onset   Hypertension Mother    Diabetes Mother    Sarcoidosis Mother        lungs and skin   Asthma Mother    Hypertension Father    Diabetes Father    Asthma Son    Tics Son    Asthma Sister    Cancer Sister        possible pancreatic cancer   Adrenal disorder Sister        Tumor    Asthma Brother    Asthma Daughter        died age 60.5   Cancer Daughter 5  brain; died age 3.5   Asthma Son    Cancer Paternal Aunt        brain, colon, lung and esophagus; unsure of primary    Breast cancer Paternal Aunt     SOCIAL HISTORY: Social History   Socioeconomic History   Marital status: Single    Spouse name: Not on file   Number of children: 3   Years of education: Some college   Highest education level: Not on file  Occupational History   Occupation: Production designer, theatre/television/film    Comment: Currently out on disability d/t surg  Tobacco Use   Smoking status: Former    Packs/day: 0.50    Years: 20.00    Total pack years: 10.00    Types: Cigarettes    Quit date: 03/17/2014    Years since quitting: 8.0   Smokeless tobacco: Never  Vaping Use   Vaping Use: Never used  Substance and Sexual Activity   Alcohol use: Yes    Alcohol/week: 0.0 standard drinks of alcohol    Drug use: No   Sexual activity: Not Currently    Birth control/protection: Surgical, Abstinence  Other Topics Concern   Not on file  Social History Narrative   Lives with 2 sons   Right Handed   Drinks caffeine2-3 cups daily   Social Determinants of Health   Financial Resource Strain: Low Risk  (04/08/2019)   Overall Financial Resource Strain (CARDIA)    Difficulty of Paying Living Expenses: Not hard at all  Food Insecurity: No Food Insecurity (04/08/2019)   Hunger Vital Sign    Worried About Running Out of Food in the Last Year: Never true    Ran Out of Food in the Last Year: Never true  Transportation Needs: No Transportation Needs (04/08/2019)   PRAPARE - Hydrologist (Medical): No    Lack of Transportation (Non-Medical): No  Physical Activity: Inactive (04/08/2019)   Exercise Vital Sign    Days of Exercise per Week: 0 days    Minutes of Exercise per Session: 0 min  Stress: Stress Concern Present (04/08/2019)   [place]    Feeling of Stress : To some extent  Social Connections: Socially Isolated (04/08/2019)   Social Connection and Isolation Panel [NHANES]    Frequency of Communication with Friends and Family: Once a week    Frequency of Social Gatherings with Friends and Family: Once a week    Attends Religious Services: Never    Marine scientist or Organizations: No    Attends Music therapist: Not asked    Marital Status: Separated  Intimate Partner Violence: Not At Risk (04/08/2019)   Humiliation, Afraid, Rape, and Kick questionnaire    Fear of Current or Ex-Partner: No    Emotionally Abused: No    Physically Abused: No    Sexually Abused: No    PHYSICAL EXAM  There were no vitals filed for this visit. There is no height or weight on file to calculate BMI.  Generalized: Well developed, in no acute distress  Neurological examination  Mentation: Alert  oriented to time, place, history taking. Follows all commands speech and language fluent Cranial nerve II-XII: Pupils were equal round reactive to light. Extraocular movements were full, visual field were full on confrontational test. Facial sensation and strength were normal. Head turning and shoulder shrug  were normal and symmetric. Motor: The motor testing reveals 5 over 5 strength  of all 4 extremities. Good symmetric motor tone is noted throughout.  Sensory: Sensory testing is intact to soft touch on all 4 extremities. No evidence of extinction is noted.  Coordination: Cerebellar testing reveals good finger-nose-finger and heel-to-shin bilaterally.  Gait and station: Gait is normal. Tandem gait is normal.  Reflexes: Deep tendon reflexes are symmetric and normal bilaterally.   DIAGNOSTIC DATA (LABS, IMAGING, TESTING) - I reviewed patient records, labs, notes, testing and imaging myself where available.  Lab Results  Component Value Date   WBC 9.6 09/28/2021   HGB 11.6 (L) 09/28/2021   HCT 36.6 09/28/2021   MCV 94.3 09/28/2021   PLT 306 09/28/2021      Component Value Date/Time   NA 136 09/28/2021 2247   NA 141 11/21/2019 1523   K 3.8 09/28/2021 2247   CL 104 09/28/2021 2247   CO2 26 09/28/2021 2247   GLUCOSE 84 09/28/2021 2247   BUN 18 09/28/2021 2247   BUN 6 11/21/2019 1523   CREATININE 0.93 09/28/2021 2247   CREATININE 0.88 06/05/2016 1003   CALCIUM 9.1 09/28/2021 2247   PROT 7.2 09/28/2021 2247   PROT 6.2 11/21/2019 1523   ALBUMIN 4.0 09/28/2021 2247   ALBUMIN 3.8 11/21/2019 1523   AST 10 (L) 09/28/2021 2247   ALT 5 09/28/2021 2247   ALKPHOS 63 09/28/2021 2247   BILITOT 0.3 09/28/2021 2247   BILITOT <0.2 11/21/2019 1523   GFRNONAA >60 09/28/2021 2247   GFRNONAA 84 06/05/2016 1003   GFRAA >60 01/24/2020 0253   GFRAA >89 06/05/2016 1003   Lab Results  Component Value Date   CHOL 168 10/30/2017   HDL 60 10/30/2017   LDLCALC 88 10/30/2017   TRIG 98 10/30/2017    CHOLHDL 2.8 10/30/2017   Lab Results  Component Value Date   HGBA1C 5.1 07/26/2017   Lab Results  Component Value Date   ZOXWRUEA54 098 01/30/2019   Lab Results  Component Value Date   TSH 1.140 01/26/2018    Butler Denmark, AGNP-C, DNP 03/19/2022, 4:11 PM [facility] [address], [place], [place] [address] [phone]

## 2022-03-19 NOTE — Progress Notes (Signed)
Botox- 200 units x 1 vial Lot: [id number] Expiration: 10/2024 NDC: 5207-6191-55    SP

## 2022-06-04 NOTE — Telephone Encounter (Signed)
It looks like her PA expired in July and needs renewed

## 2022-06-09 ENCOUNTER — Telehealth: Payer: Self-pay

## 2022-06-09 NOTE — Telephone Encounter (Signed)
Late Entry- on 06/09/22 I called pt's insurance and spoke with Amy Jcode PA not needed but PA for medication is. I submitted via telephone and received instant approval for botox 155 units. Auth good from 06/08/2022-06/09/2023 Ref # [id number]. Amy sts order will be delivered on 06/10/2022 Order # [id number].

## 2022-06-11 ENCOUNTER — Ambulatory Visit (INDEPENDENT_AMBULATORY_CARE_PROVIDER_SITE_OTHER): Payer: 59 | Admitting: Neurology

## 2022-06-11 DIAGNOSIS — D352 Benign neoplasm of pituitary gland: Secondary | ICD-10-CM

## 2022-06-11 DIAGNOSIS — R202 Paresthesia of skin: Secondary | ICD-10-CM

## 2022-06-11 DIAGNOSIS — G43711 Chronic migraine without aura, intractable, with status migrainosus: Secondary | ICD-10-CM | POA: Diagnosis not present

## 2022-06-11 MED ORDER — ONABOTULINUMTOXINA 200 UNITS IJ SOLR
200.0000 [IU] | Freq: Once | INTRAMUSCULAR | Status: AC
  Administered 2022-06-11: 200 [IU] via INTRAMUSCULAR

## 2022-06-11 NOTE — Progress Notes (Signed)
Botox 200 units x 1 vial [id number] Exp-10/2024 SP

## 2022-06-11 NOTE — Progress Notes (Addendum)
Patient: [name] Date of Birth: 08/21/1976  Reason for Visit: Follow up History from: Patient Primary Neurologist: [name]   ASSESSMENT AND PLAN 46 y.o. year old female   65.  Chronic migraine headache 2.  Pituitary microadenoma 3.  Episode of right arm numbness 4.  Different type of headache 5.  Chronic pain, on chronic Dilaudid 6.  Presence of bladder stimulator  -Botox given today for headache, has found to be very beneficial, at least 70% -Order MRI of the brain with and without contrast given concern for possible TIA/stroke event, also for surveillance of pituitary microadenoma, has [employer]tr[employer] bladder stimulator -Her neuro exam is unrevealing today -I will see her back in 3 months for Botox, can adjust depending on MRI if needed   HISTORY Today 06/11/22 SS: Here for Botox visit, mentions concern for the last 1 month fullness sensation to frontal area, different from typical migraine headache.  She had 1 day where she had numbness to her right arm, said her mouth was twisted, not to 1 side or other.  Her sister told her to go to the ER, she did not.  Has concerns of known pituitary adenoma.  Has seen endocrinology in the past. "Didn't give good news", wanted to start medication.  Often when standing, she becomes dizzy, has been noted to have frequent falls.  She is under the care of pain clinic is on Dilaudid every 4 hours.  Under a lot of stress. Her brother was on the phone ranting about why she was going to let me give her botox.  Botox has been at least 70% helpful for her migraines.   03/19/22 SS: [name] is here today for follow-up. Had medtronic bladder stimulator, is working great, no longer on oxybutynin.  Here today for botox for migraines, has 80% improvement. With Botox for 2 months has no migraines. Has depression, has been referred to psychiatry for a new opinion. Goes to pain management, gets Dilaudid for back, neck, shoulder pain.    HISTORY   12/18/2020 SS: [name] is a 46 year old female with history of intractable headache. She remains on Botox therapy, last in January 2022. Had a fall on 11/27/20, standing on her son's bed, lost her balance, fell into the wall, reports LOC. She went to ER, she had vomiting that night, CT head, CT cervical spine no acute abnormalities. Since then, having more frequent migraines, nearing time for next Botox. Has been taking baclofen 20 mg at bedtime to help with headaches, helping to fall asleep. Next Botox is Aprill 11th, has done well, clear benefit. Only a few migraines a month with Botox. Considering plastic surgery to abdomen and liposuction. Has been dealing with Depression, worsening lately, moved to a bigger place. Anxious today about coming out in rain, sees psych.  Had MRI of the lumbar spine in February, showed mild degenerative changes.  Her PCP does her pain management, takes Robaxin, and Dilaudid.  REVIEW OF SYSTEMS: Out of a complete 14 system review of symptoms, the patient complains only of the following symptoms, and all other reviewed systems are negative.  See HPI  ALLERGIES: Allergies  Allergen Reactions   Coconut (Cocos Nucifera) Anaphylaxis and Itching   Morphine And Related Anaphylaxis and Hives    Pt states that she has tolerated Norco and Dilaudid   Oxycodone Anaphylaxis   Ciprofloxacin Itching and Rash   Trazodone And Nefazodone Rash   Vistaril [Hydroxyzine Hcl] Hives   Doxycycline Nausea And Vomiting  Silicone    Adhesive [Tape] Rash    And paper tape causes a rash if wearing for a prolong period of time   Penicillins Rash    Has patient had a PCN reaction causing immediate rash, facial/tongue/throat swelling, SOB or lightheadedness with hypotension: Yes Has patient had a PCN reaction causing severe rash involving mucus membranes or skin necrosis: No Has patient had a PCN reaction that required hospitalization No Has patient had a PCN reaction occurring within  the last 10 years: No If all of the above answers are "NO", then may proceed with Cephalosporin use.    HOME MEDICATIONS: Outpatient Medications Prior to Visit  Medication Sig Dispense Refill   albuterol (VENTOLIN HFA) 108 (90 Base) MCG/ACT inhaler Inhale 2 puffs into the lungs every 6 (six) hours as needed for wheezing or shortness of breath.     baclofen (LIORESAL) 10 MG tablet TAKE 1 TABLET BY MOUTH EVERYDAY AT BEDTIME 90 tablet 1   Botulinum Toxin Type A (BOTOX) 200 units SOLR INJECT 155 UNITS  INTRAMUSCULARLY INTO HEAD  AND NECK MUSCLES EVERY 3  MONTHS (GIVEN AT MD OFFICE, DISCARD UNUSED) 1 each 3   buPROPion (WELLBUTRIN XL) 300 MG 24 hr tablet Take 300 mg by mouth every morning.     buPROPion (ZYBAN) 150 MG 12 hr tablet bupropion HCl 150 mg tablet,12 hr sustained-release(smoking deterrent)  Take 1 tablet twice a day by oral route.     busPIRone (BUSPAR) 10 MG tablet Take 10 mg by mouth 3 (three) times daily.     celecoxib (CELEBREX) 200 MG capsule Take 1 capsule (200 mg total) by mouth 2 (two) times daily. 60 capsule 0   Cholecalciferol (VITAMIN D3) 1.25 MG (50000 UT) CAPS Take 50,000 Units by mouth every 7 (seven) days.      cyanocobalamin (,VITAMIN B-12,) 1000 MCG/ML injection Inject 1,000 mcg into the muscle 2 (two) times a week.      diclofenac Sodium (VOLTAREN) 1 % GEL Apply topically 4 (four) times daily.     dicyclomine (BENTYL) 10 MG capsule Take 10 mg by mouth 2 (two) times daily as needed.     DULoxetine (CYMBALTA) 60 MG capsule Take 1 capsule (60 mg total) by mouth 2 (two) times daily. 60 capsule 0   EPINEPHrine 0.3 mg/0.3 mL IJ SOAJ injection Inject 0.3 mg into the muscle as needed for anaphylaxis. 1 each 0   ferrous sulfate 325 (65 FE) MG tablet Take 1 tablet (325 mg total) by mouth 2 (two) times daily with a meal. 28 tablet 3   fluticasone (FLONASE) 50 MCG/ACT nasal spray Place 2 sprays into both nostrils daily as needed for allergies or rhinitis.     HYDROmorphone  (DILAUDID) 4 MG tablet Take 1-2 tablets (4-8 mg total) by mouth every 4 (four) hours as needed for moderate pain or severe pain. (Patient taking differently: Take 1-2 tablets (4-8 mg total) by mouth every 3 hours as needed for moderate pain or severe pain.)  0   ibuprofen (ADVIL) 600 MG tablet Take 1 tablet (600 mg total) by mouth every 6 (six) hours as needed. 30 tablet 0   linaclotide (LINZESS) 290 MCG CAPS capsule Take 290 mcg by mouth daily before breakfast.     mesalamine (LIALDA) 1.2 g EC tablet Lialda 1.2 gram tablet,delayed release     metoCLOPramide (REGLAN) 10 MG tablet TAKE 1 TABLET (10 MG TOTAL) BY MOUTH 3 (THREE) TIMES DAILY BEFORE MEALS. 90 tablet 0   Multiple Vitamin (MULTIVITAMIN WITH  MINERALS) TABS tablet Take 1 tablet by mouth daily.     NARCAN 4 MG/0.1ML LIQD nasal spray kit 1 spray as directed.     ondansetron (ZOFRAN) 8 MG tablet Take 8 mg by mouth every 8 (eight) hours as needed for nausea or vomiting.     pantoprazole (PROTONIX) 40 MG tablet TAKE 1 TABLET BY MOUTH TWICE A DAY BEFORE A MEAL 180 tablet 1   sucralfate (CARAFATE) 1 g tablet Take 2 g by mouth 2 (two) times daily.     SYMPROIC 0.2 MG TABS Take 1 tablet by mouth daily.     thiamine (VITAMIN B-1) 100 MG tablet Take 100 mg by mouth daily at 12 noon.      clindamycin (CLEOCIN) 300 MG capsule Take 1 capsule (300 mg total) by mouth 4 (four) times daily. X 7 days 28 capsule 0   methocarbamol (ROBAXIN) 500 MG tablet Take 500 mg by mouth every 8 (eight) hours as needed for muscle spasms.      No facility-administered medications prior to visit.    PAST MEDICAL HISTORY: Past Medical History:  Diagnosis Date   Anal fissure - posterior 10/16/2014    OCC  ISSUES   Anxiety    doesn't take anything   Asthma    has Albuterol inhaler as needed   Boil    on pubic area started septra 03-01-17 draining blood and pus   Bronchitis    Chronic headache disorder 07/22/2016   Clostridium difficile infection 04/20/2012   Colitis     Dehydration    Depression    Family history of adverse reaction to anesthesia    pt mom gets sick   GERD (gastroesophageal reflux disease)    takes Pantoprazole daily   Headache    Hiatal hernia    neuropathy - mild in arms and legs   History of blood transfusion    last transfusion was 04/04/2016=Benadryl was given d/t itching. States she always itches with transfusion.    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   Marginal ulcer 10/25/2017   Migraine    Motion sickness    Nausea    takes Zofran as needed   Nausea and vomiting    for 1 year   Obesity    Oligouria    Osteoarthritis    lower back, knees, wrists - no meds   Peripheral neuropathy 03/01/2019   Pneumonia 1997   Postoperative nausea and vomiting 01/21/2016   wants scopolamine patch   Right carpal tunnel syndrome 03/01/2019   SVD (spontaneous vaginal delivery)    x 4   Tingling    BOTH LOWER EXTREMETIES ALL THE TIME DUE TO RAPID WEIGHT LOSS   TOBACCO USER 10/02/2009   Qualifier: Diagnosis of  By: Dimas Millin MD, Ellard Artis     Transfusion history    last transfusion 6'16    Tremor 08/31/2018   UC (ulcerative colitis) ([place])    supposed to be taking Lialda and Bentyl but has been off since gastric sleeve   Vertigo    doesn't take  any meds    PAST SURGICAL HISTORY: Past Surgical History:  Procedure Laterality Date   ABDOMINAL HYSTERECTOMY     PARTIAL   COLONOSCOPY  2007   for rectal bleeding; [facility]   COLONOSCOPY N/A 06/11/2014   Procedure: COLONOSCOPY;  Surgeon: Gatha Mayer, MD;  Location: [facility];  Service: Endoscopy;  Laterality: N/A;   COLONOSCOPY WITH PROPOFOL N/A 03/02/2017    Procedure: COLONOSCOPY WITH PROPOFOL;  Surgeon: Alphonsa Overall, MD;  Location: [facility];  Service: General;  Laterality: N/A;   DILATION AND CURETTAGE OF UTERUS     ESOPHAGOGASTRODUODENOSCOPY (EGD) WITH PROPOFOL N/A 04/06/2016   Procedure: ESOPHAGOGASTRODUODENOSCOPY (EGD) WITH PROPOFOL;  Surgeon: Alphonsa Overall, MD;  Location: [facility];  Service: General;  Laterality: N/A;   ESOPHAGOGASTRODUODENOSCOPY (EGD) WITH PROPOFOL N/A 03/02/2017   Procedure: ESOPHAGOGASTRODUODENOSCOPY (EGD) WITH PROPOFOL;  Surgeon: Alphonsa Overall, MD;  Location: [facility];  Service: General;  Laterality: N/A;   ESOPHAGOGASTRODUODENOSCOPY (EGD) WITH PROPOFOL N/A 05/20/2017   Procedure: ESOPHAGOGASTRODUODENOSCOPY (EGD) WITH PROPOFOL ERAS PATHWAY;  Surgeon: Alphonsa Overall, MD;  Location: [facility];  Service: General;  Laterality: N/A;   ESOPHAGOGASTRODUODENOSCOPY (EGD) WITH PROPOFOL N/A 10/07/2017   Procedure: ESOPHAGOGASTRODUODENOSCOPY (EGD) WITH PROPOFOL;  Surgeon: Alphonsa Overall, MD;  Location: [facility];  Service: General;  Laterality: N/A;   EXCISION OF SKIN TAG  06/08/2017   Procedure: EXCISION OF VULVAR SKIN TAGS X2;  Surgeon: [name], MD;  Location: [facility];  Service: Gynecology;;   FOOT SURGERY Bilateral    x 2   GASTRIC ROUX-EN-Y N/A 12/14/2016   Procedure: LAPAROSCOPIC REVISION SLEEVE GASTRECTOMY TO  ROUX-Y-GASTRIC BY-PASS, UPPER ENDO;  Surgeon: Excell Seltzer, MD;  Location: [facility];  Service: General;  Laterality: N/A;   GASTROJEJUNOSTOMY N/A 05/11/2016   Procedure: LAPAROSCOPIC PLACEMENT  OF FEEDING  JEJUNOSTOMY TUBE;  Surgeon: Excell Seltzer, MD;  Location: [facility];  Service: General;  Laterality: N/A;   HEMORRHOIDECTOMY WITH HEMORRHOID BANDING     IR FLUORO GUIDE CV LINE RIGHT  04/06/2017   IR GENERIC HISTORICAL  05/19/2016   IR CM INJ ANY COLONIC TUBE W/FLUORO 05/19/2016 Aletta Edouard, MD [facility]   IR PATIENT EVAL TECH 0-60 MINS  11/30/2017   IR [place] DUODEN/JEJUNO TUBE PERCUT W/FLUORO   03/14/2018   IR [place] DUODEN/JEJUNO TUBE PERCUT W/FLUORO  03/15/2018   IR US GUIDE VASC ACCESS RIGHT  04/06/2017   j tube removed march 2018     KNEE ARTHROSCOPY Left 09/06/2014   LAPAROSCOPIC GASTRIC SLEEVE RESECTION N/A 01/14/2016   Procedure: LAPAROSCOPIC GASTRIC SLEEVE RESECTION;  Surgeon: Excell Seltzer, MD;  Location: [facility];  Service: General;  Laterality: N/A;   LAPAROSCOPIC REVISION OF GASTROJEJUNOSTOMY N/A 10/25/2017   Procedure: LAPAROSCOPIC REVISION OF GASTROJEJUNOSTOMY AND PARTIAL GASTRECTOMY, WITH PLACEMENT OF FEEDING GASTROSTOMY TUBE;  Surgeon: Excell Seltzer, MD;  Location: [facility];  Service: General;  Laterality: N/A;   LAPAROSCOPIC TUBAL LIGATION  10/16/2011   Procedure: LAPAROSCOPIC TUBAL LIGATION;  Surgeon: Alwyn Pea, MD;  Location: [facility];  Service: Gynecology;  Laterality: Bilateral;   NOVASURE ABLATION  09/28/2010   mild persistent vaginal bleeding   right knee arthroscopy     05/07/2016   TOTAL KNEE ARTHROPLASTY Left 03/10/2018   Procedure: LEFT TOTAL KNEE ARTHROPLASTY;  Surgeon: Paralee Cancel, MD;  Location: [facility];  Service: Orthopedics;  Laterality: Left;  70 mins   TOTAL KNEE ARTHROPLASTY Right 01/23/2020   Procedure: TOTAL KNEE ARTHROPLASTY, BILATERAL TROCHANTERIC INJECTION;  Surgeon: Paralee Cancel, MD;  Location: [facility];  Service: Orthopedics;  Laterality: Right;  70 mins for Bilateral Troch injection 1 cc Depo and 2 CC Lidocaine   TUBAL LIGATION     VAGINAL HYSTERECTOMY Bilateral 06/08/2017   Procedure: HYSTERECTOMY VAGINAL W/ BILATERAL SALPINGECTOMY;  Surgeon: [name], MD;  Location: [facility];  Service: Gynecology;  Laterality: Bilateral;   wisdom teeth extracted      FAMILY HISTORY: Family History  Problem Relation Age of Onset   Hypertension Mother    Diabetes Mother    Sarcoidosis Mother        lungs and skin   Asthma Mother    Hypertension Father    Diabetes Father    Asthma Son    Tics Son    Asthma Sister    Cancer Sister         possible pancreatic cancer   Adrenal disorder Sister        Tumor    Asthma Brother    Asthma Daughter        died age 91.5   Cancer Daughter 59       brain; died age 91.5   Asthma Son    Cancer Paternal Aunt        brain, colon, lung and esophagus; unsure of primary    Breast cancer Paternal Aunt     SOCIAL HISTORY: Social History   Socioeconomic History   Marital status: Single    Spouse name: Not on file   Number of children: 3   Years of education: Some college   Highest education level: Not on file  Occupational History   Occupation: Production designer, theatre/television/film    Comment: Currently out on disability d/t surg  Tobacco Use   Smoking status: Former    Packs/day: 0.50    Years: 20.00    Total pack years: 10.00    Types: Cigarettes    Quit date: 03/17/2014    Years since quitting: 8.2   Smokeless tobacco: Never  Vaping Use   Vaping Use: Never used  Substance and Sexual Activity   Alcohol use: Yes    Alcohol/week: 0.0 standard drinks of alcohol   Drug use: No   Sexual activity: Not Currently    Birth control/protection: Surgical, Abstinence  Other Topics Concern   Not on file  Social History Narrative   Lives with 2 sons   Right Handed   Drinks caffeine2-3 cups daily   Social Determinants of Health   Financial Resource Strain: Low Risk  (04/08/2019)   Overall Financial Resource Strain (CARDIA)    Difficulty of Paying Living Expenses: Not hard at all  Food Insecurity: No Food Insecurity (04/08/2019)   Hunger Vital Sign    Worried About Running Out of Food in the Last Year: Never true    Ran Out of Food in the Last Year: Never true  Transportation Needs: No Transportation Needs (04/08/2019)   PRAPARE - Hydrologist (Medical): No    Lack of Transportation (Non-Medical): No  Physical Activity: Inactive (04/08/2019)   Exercise Vital Sign    Days of Exercise per Week: 0 days    Minutes of Exercise per Session: 0 min  Stress: Stress Concern  Present (04/08/2019)   [place]    Feeling of Stress : To some extent  Social Connections: Socially Isolated (04/08/2019)   Social Connection and Isolation Panel [NHANES]    Frequency of Communication with Friends and Family: Once a week  Frequency of Social Gatherings with Friends and Family: Once a week    Attends Religious Services: Never    Marine scientist or Organizations: No    Attends Music therapist: Not asked    Marital Status: Separated  Intimate Partner Violence: Not At Risk (04/08/2019)   Humiliation, Afraid, Rape, and Kick questionnaire    Fear of Current or Ex-Partner: No    Emotionally Abused: No    Physically Abused: No    Sexually Abused: No    PHYSICAL EXAM  There were no vitals filed for this visit. There is no height or weight on file to calculate BMI.  Generalized: Well developed, in no acute distress  Neurological examination  Mentation: Alert oriented to time, place, history taking. Follows all commands speech and language fluent Cranial nerve II-XII: Pupils were equal round reactive to light. Extraocular movements were full, visual field were full on confrontational test. Facial sensation and strength were normal. Head turning and shoulder shrug  were normal and symmetric. Motor: The motor testing reveals 5 over 5 strength of all 4 extremities. Good symmetric motor tone is noted throughout.  Sensory: Sensory testing is intact to soft touch on all 4 extremities. No evidence of extinction is noted.  Coordination: Cerebellar testing reveals good finger-nose-finger and heel-to-shin bilaterally.  Gait and station: Gait is normal. Tandem gait is normal.  Reflexes: Deep tendon reflexes are symmetric and normal bilaterally.   DIAGNOSTIC DATA (LABS, IMAGING, TESTING) - I reviewed patient records, labs, notes, testing and imaging myself where available.  Lab Results  Component  Value Date   WBC 9.6 09/28/2021   HGB 11.6 (L) 09/28/2021   HCT 36.6 09/28/2021   MCV 94.3 09/28/2021   PLT 306 09/28/2021      Component Value Date/Time   NA 136 09/28/2021 2247   NA 141 11/21/2019 1523   K 3.8 09/28/2021 2247   CL 104 09/28/2021 2247   CO2 26 09/28/2021 2247   GLUCOSE 84 09/28/2021 2247   BUN 18 09/28/2021 2247   BUN 6 11/21/2019 1523   CREATININE 0.93 09/28/2021 2247   CREATININE 0.88 06/05/2016 1003   CALCIUM 9.1 09/28/2021 2247   PROT 7.2 09/28/2021 2247   PROT 6.2 11/21/2019 1523   ALBUMIN 4.0 09/28/2021 2247   ALBUMIN 3.8 11/21/2019 1523   AST 10 (L) 09/28/2021 2247   ALT 5 09/28/2021 2247   ALKPHOS 63 09/28/2021 2247   BILITOT 0.3 09/28/2021 2247   BILITOT <0.2 11/21/2019 1523   GFRNONAA >60 09/28/2021 2247   GFRNONAA 84 06/05/2016 1003   GFRAA >60 01/24/2020 0253   GFRAA >89 06/05/2016 1003   Lab Results  Component Value Date   CHOL 168 10/30/2017   HDL 60 10/30/2017   LDLCALC 88 10/30/2017   TRIG 98 10/30/2017   CHOLHDL 2.8 10/30/2017   Lab Results  Component Value Date   HGBA1C 5.1 07/26/2017   Lab Results  Component Value Date   BTDVVOHY07 371 01/30/2019   Lab Results  Component Value Date   TSH 1.140 01/26/2018    Butler Denmark, AGNP-C, DNP 06/11/2022, 4:31 PM [facility] [address] [place], [place] [address] [phone]   BOTOX PROCEDURE NOTE FOR MIGRAINE HEADACHE  HISTORY: [name] is here today for Botox for chronic migraine headaches. Last injection was 03/19/22 by me.  She continues to have excellent benefit.  Description of procedure:  The patient was placed in a sitting position. The standard protocol was used for  Botox as follows, with 5 units of Botox injected at each site:  -Procerus muscle, midline injection  -Corrugator muscle, bilateral injection  -Frontalis muscle, bilateral injection, with 2 sites each side, medial injection was performed in the upper one third of the  frontalis muscle, in the region vertical from the medial inferior edge of the superior orbital rim. The lateral injection was again in the upper one third of the forehead vertically above the lateral limbus of the cornea, 1.5 cm lateral to the medial injection site.  -Temporalis muscle injection, 4 sites, bilaterally. The first injection was 3 cm above the tragus of the ear, second injection site was 1.5 cm to 3 cm up from the first injection site in line with the tragus of the ear. The third injection site was 1.5-3 cm forward between the first 2 injection sites. The fourth injection site was 1.5 cm posterior to the second injection site.  -Occipitalis muscle injection, 3 sites, bilaterally. The first injection was done one half way between the occipital protuberance and the tip of the mastoid process behind the ear. The second injection site was done lateral and superior to the first, 1 fingerbreadth from the first injection. The third injection site was 1 fingerbreadth superiorly and medially from the first injection site.  -Cervical paraspinal muscle injection, 2 sites, bilateral, the first injection site was 1 cm from the midline of the cervical spine, 3 cm inferior to the lower border of the occipital protuberance. The second injection site was 1.5 cm superiorly and laterally to the first injection site.  -Trapezius muscle injection was performed at 3 sites, bilaterally. The first injection site was in the upper trapezius muscle halfway between the inflection point of the neck, and the acromion. The second injection site was one half way between the acromion and the first injection site. The third injection was done between the first injection site and the inflection point of the neck.   A 200 unit bottle of Botox was used, 155 units were injected, the rest of the Botox was wasted. The patient tolerated the procedure well, there were no complications of the above procedure.  Botox NDC  4431-5400-86 Lot number [id number] Expiration date 10/2024 sp

## 2022-06-12 LAB — COMPREHENSIVE METABOLIC PANEL
ALT: 7 IU/L (ref 0–32)
AST: 12 IU/L (ref 0–40)
Albumin/Globulin Ratio: 1.7 (ref 1.2–2.2)
Albumin: 4.7 g/dL (ref 3.9–4.9)
Alkaline Phosphatase: 77 IU/L (ref 44–121)
BUN/Creatinine Ratio: 18 (ref 9–23)
BUN: 20 mg/dL (ref 6–24)
Bilirubin Total: 0.4 mg/dL (ref 0.0–1.2)
CO2: 26 mmol/L (ref 20–29)
Calcium: 10.2 mg/dL (ref 8.7–10.2)
Chloride: 95 mmol/L — ABNORMAL LOW (ref 96–106)
Creatinine, Ser: 1.1 mg/dL — ABNORMAL HIGH (ref 0.57–1.00)
Globulin, Total: 2.7 g/dL (ref 1.5–4.5)
Glucose: 75 mg/dL (ref 70–99)
Potassium: 3.9 mmol/L (ref 3.5–5.2)
Sodium: 137 mmol/L (ref 134–144)
Total Protein: 7.4 g/dL (ref 6.0–8.5)
eGFR: 64 mL/min/{1.73_m2} (ref >59)

## 2022-06-22 ENCOUNTER — Telehealth: Payer: Self-pay | Admitting: Neurology

## 2022-06-22 NOTE — Telephone Encounter (Signed)
[employer] Josem Kaufmann: [id number] exp. 06/22/22-08/06/22 sent to [facility] for bladder implant

## 2022-07-01 ENCOUNTER — Telehealth: Payer: Self-pay | Admitting: Neurology

## 2022-07-01 NOTE — Telephone Encounter (Signed)
Received office note from Dr. Hartford Poli at [facility] endocrine from office visit 06/25/2022.  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 Labs in January found Prolactin down to 21.1. Since then she has had significant neurologic problems including migraines, balance issues, dysphagia and facial tingling. She still has occasional bilateral nipple discharges. I reassured CJulissathat her pituitary microadenoma and borderline hyperprolactinemia are not the causes of her neurologic symptoms. I will recheck a prolactin level today for current evaluation. Treatment with a dopamine agonist will remained deferred if levels are stable (high-normal to borderline high) since her galactorrhea is not affecting her quality of life and menstrual regulation is not a concern with prior hysterectomy. Prolactin levels can be checked with routine blood work for monitoring.

## 2022-07-12 ENCOUNTER — Ambulatory Visit (HOSPITAL_COMMUNITY)
Admission: RE | Admit: 2022-07-12 | Discharge: 2022-07-12 | Disposition: A | Discharge status: Home / Self Care | Payer: 59 | Source: Ambulatory Visit | Attending: Neurology | Admitting: Neurology

## 2022-07-12 DIAGNOSIS — G43711 Chronic migraine without aura, intractable, with status migrainosus: Secondary | ICD-10-CM | POA: Diagnosis not present

## 2022-07-12 MED ORDER — GADOBUTROL 1 MMOL/ML IV SOLN
8.5000 mL | Freq: Once | INTRAVENOUS | Status: AC | PRN
  Administered 2022-07-12: 8.5 mL via INTRAVENOUS

## 2022-07-14 ENCOUNTER — Telehealth: Payer: Self-pay | Admitting: Neurology

## 2022-07-14 DIAGNOSIS — G8929 Other chronic pain: Secondary | ICD-10-CM

## 2022-07-14 NOTE — Telephone Encounter (Signed)
I called the pt relayed message and she verbalized understanding and agreement to ophthalmology referral. She will call back if she has not heard within the week on scheduling evaluation.

## 2022-07-14 NOTE — Telephone Encounter (Signed)
Referral sent to [facility], phone # [phone].

## 2022-07-14 NOTE — Telephone Encounter (Signed)
Please call the patient, MRI of the brain did not show any acute findings, pituitary microadenoma is stable at 4 mm.  It did mention narrowing at the sigmoid/transverse sinus junctions bilaterally.  This is overall nonspecific, but can be seen in intracranial hypertension.  Given her continued headaches, I would recommend funduscopic evaluation with ophthalmology, this is to check for papilledema. If she is agreeable I would refer to Dr. Katy Fitch. I placed the order.   IMPRESSION: 1. No evidence of acute infarct or hemorrhage. 2. Stable 4 mm lesion in the right aspect of the pituitary gland, favored to represent a small pituitary microadenoma.  ADDENDUM: Redemosntrated narrowing at the sigmoid/transverse sinus junctions bilaterally. This is a nonspecific finding, but could be seen in the setting of intracranial hypertension. Correlate with funduscopic exam.  Orders Placed This Encounter  Procedures   Ambulatory referral to Ophthalmology

## 2022-08-19 NOTE — Telephone Encounter (Signed)
I received office notes from Shirleen Schirmer at Dr. Zenia Resides office, reports no IIH, no nerve edema, no ocular etiology for headache.  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 Jonna Clark 09/24/22.   Work up has not found anything to explain continued headaches, I would like for her to see Dr. Jaynee Eagles or Dr. Billey Gosling for a 2nd opinion about her headaches if she is agreeable.

## 2022-08-19 NOTE — Telephone Encounter (Signed)
Received office note from [place] dated 08/18/2022. Note placed in [name]'s office for review.

## 2022-08-25 NOTE — Telephone Encounter (Signed)
I called pt and we discussed message from NP.  She agreeable to plan and I have scheduled for 09/03/2022 at 1130 with Dr. Billey Gosling.

## 2022-09-02 NOTE — Progress Notes (Deleted)
   CC:  headaches  Follow-up Visit  Last visit: 06/11/22 for Botox  Brief HPI: 46 year old female with a history of pituitary microadenoma, anxiety, asthma, colitis, GERD who follows in clinic for chronic migraines. She is currently managed with Botox for migraine prevention.  At her last visit, brain MRI was ordered for an episode of right arm numbness.  Interval History: *** Brain MRI showed a stable 33m pituitary microadenoma and narrowing of the sigmoid/transverse sinus junctions. She was referred to ophthalmology who noted a normal eye exam without papilledema.   Headache days per month: *** Migraine days per month*** Headache free days per month: ***  Current Headache Regimen: Preventative: *** Abortive: ***   Prior Therapies                                  Robaxin baclofen Tylenol Wellbutrin Effexor Topamax gabapentin Aimovig Botox  Physical Exam:   Vital Signs: LMP 06/02/2017 Comment: hysterectomy 06/08/2017 GENERAL:  well appearing, in no acute distress, alert  SKIN:  Color, texture, turgor normal. No rashes or lesions HEAD:  Normocephalic/atraumatic. RESP: normal respiratory effort MSK:  No gross joint deformities.   NEUROLOGICAL: Mental Status: Alert, oriented to person, place and time, Follows commands, and Speech fluent and appropriate. Cranial Nerves: PERRL, face symmetric, no dysarthria, hearing grossly intact Motor: moves all extremities equally Gait: normal-based.  IMPRESSION: ***  PLAN: ***   Follow-up: ***  I spent a total of *** minutes on the date of the service. Headache education was done. Discussed lifestyle modification including increased oral hydration, decreased caffeine, exercise and stress management. Discussed treatment options including preventive and acute medications, natural supplements, and infusion therapy. Discussed medication overuse headache and to limit use of acute treatments to no more than 2 days/week or 10  days/month. Discussed medication side effects, adverse reactions and drug interactions. Written educational materials and patient instructions outlining all of the above were given.  JGenia Harold MD

## 2022-09-03 ENCOUNTER — Ambulatory Visit: Payer: 59 | Admitting: Psychiatry

## 2022-09-03 ENCOUNTER — Encounter: Payer: Self-pay | Admitting: Psychiatry

## 2022-09-09 ENCOUNTER — Ambulatory Visit (INDEPENDENT_AMBULATORY_CARE_PROVIDER_SITE_OTHER): Payer: 59 | Admitting: Neurology

## 2022-09-09 ENCOUNTER — Encounter: Payer: Self-pay | Admitting: Neurology

## 2022-09-09 VITALS — BP 118/78 | HR 64 | Ht 67.0 in | Wt 183.0 lb

## 2022-09-09 DIAGNOSIS — R519 Headache, unspecified: Secondary | ICD-10-CM | POA: Diagnosis not present

## 2022-09-09 DIAGNOSIS — G8929 Other chronic pain: Secondary | ICD-10-CM

## 2022-09-09 MED ORDER — ONABOTULINUMTOXINA 200 UNITS IJ SOLR
200.0000 [IU] | Freq: Once | INTRAMUSCULAR | Status: AC
  Administered 2022-09-09: 200 [IU] via INTRAMUSCULAR

## 2022-09-09 NOTE — Progress Notes (Signed)
Botox 200 units x 1 vial  [id number] [id number] Exp-11-2024 SP

## 2022-09-09 NOTE — Progress Notes (Signed)
   BOTOX PROCEDURE NOTE FOR MIGRAINE HEADACHE   HISTORY: [name] is here for botox for chronic migraine headache.  Botox remains 90% helpful for her headaches.  She is here today with her son.  She was scheduled for a visit with Dr. Billey Gosling but missed the appointment.  Saw ophthalmology, no IIH, no nerve edema, no ocular etiology for headache.  Had MRI of the brain with and without contrast in October 2023 showed stable small pituitary microadenoma 4 mm, redemonstrated narrowing at the sigmoid/transverse sinus junctions bilaterally.  Recommended correlating with funduscopic exam. She will reschedule with Dr. Billey Gosling.   Description of procedure:  The patient was placed in a sitting position. The standard protocol was used for Botox as follows, with 5 units of Botox injected at each site:  -Procerus muscle, midline injection  -Corrugator muscle, bilateral injection  -Frontalis muscle, bilateral injection, with 2 sites each side, medial injection was performed in the upper one third of the frontalis muscle, in the region vertical from the medial inferior edge of the superior orbital rim. The lateral injection was again in the upper one third of the forehead vertically above the lateral limbus of the cornea, 1.5 cm lateral to the medial injection site.  -Temporalis muscle injection, 4 sites, bilaterally. The first injection was 3 cm above the tragus of the ear, second injection site was 1.5 cm to 3 cm up from the first injection site in line with the tragus of the ear. The third injection site was 1.5-3 cm forward between the first 2 injection sites. The fourth injection site was 1.5 cm posterior to the second injection site.  -Occipitalis muscle injection, 3 sites, bilaterally. The first injection was done one half way between the occipital protuberance and the tip of the mastoid process behind the ear. The second injection site was done lateral and superior to the first, 1 fingerbreadth from the first  injection. The third injection site was 1 fingerbreadth superiorly and medially from the first injection site.  -Cervical paraspinal muscle injection, 2 sites, bilateral, the first injection site was 1 cm from the midline of the cervical spine, 3 cm inferior to the lower border of the occipital protuberance. The second injection site was 1.5 cm superiorly and laterally to the first injection site.  -Trapezius muscle injection was performed at 3 sites, bilaterally. The first injection site was in the upper trapezius muscle halfway between the inflection point of the neck, and the acromion. The second injection site was one half way between the acromion and the first injection site. The third injection was done between the first injection site and the inflection point of the neck.   A 200 unit bottle of Botox was used, 155 units were injected, the rest of the Botox was wasted. The patient tolerated the procedure well, there were no complications of the above procedure.  Botox NDC 6644-0347-42 Lot number [id number] Expiration date 11/2024 SP

## 2022-09-28 DIAGNOSIS — R2681 Unsteadiness on feet: Secondary | ICD-10-CM

## 2022-09-28 HISTORY — DX: Unsteadiness on feet: R26.81

## 2022-11-02 ENCOUNTER — Ambulatory Visit (INDEPENDENT_AMBULATORY_CARE_PROVIDER_SITE_OTHER): Payer: 59 | Admitting: Psychiatry

## 2022-11-02 ENCOUNTER — Encounter: Payer: Self-pay | Admitting: Psychiatry

## 2022-11-02 VITALS — BP 114/75 | HR 90 | Ht 66.0 in | Wt 176.5 lb

## 2022-11-02 DIAGNOSIS — G43711 Chronic migraine without aura, intractable, with status migrainosus: Secondary | ICD-10-CM

## 2022-11-02 MED ORDER — SUMATRIPTAN SUCCINATE 50 MG PO TABS: 50.0000 mg | ORAL_TABLET | ORAL | 6 refills | Status: DC | PRN

## 2022-11-02 MED ORDER — QULIPTA 30 MG PO TABS: 30.0000 mg | ORAL_TABLET | Freq: Every day | ORAL | 6 refills | Status: DC

## 2022-11-02 NOTE — Progress Notes (Signed)
   CC:  headaches  Follow-up Visit  Last visit: 09/09/22 (Botox)  Brief HPI: 47 year old female with a history of B12 deficiency, asthma, GERD, ulcerative colitis, s/p gastric bypass, chronic neck/low back pain, right carpal tunnel and ulnar neuropathy who follows in clinic for chronic migraines. MRI brain on 07/12/22 showed a stable 4 mm pituitary microadenoma and narrowing and sigmoid/transverse junctions. Ophthalmology exam was normal without papilledema.  Interval History: Botox helps with her migraines frequency, but she continues to have breakthrough migraines especially in the 4 weeks leading up to her next injection. Migraines can last for 2-4 days at a time. She takes Tylenol or ibuprofen as needed but does not find this effective. States she has never tried a triptan for migraine rescue.  Current Headache Regimen: Preventative: Botox Abortive: Tylenol, ibuprofen   Prior Therapies                                  Preventive: Cymbalta 60 mg daily Nortriptyline 30 mg QHS Topamax Metoprolol 12.5 mg daily Gabapentin 400 mg TID Aimovig 140 mg monthly Botox  Rescue: Celebrex 200 mg BID Reglan 10 mg PRN Baclofen 10 mg PRN Methocarbamol 500 mg TID PRN  Physical Exam:   Vital Signs: BP 114/75 (BP Location: Left Arm, Patient Position: Sitting, Cuff Size: Normal)   Pulse 90   Ht '5\' 6"'$  (1.676 m)   Wt 176 lb 8 oz (80.1 kg)   LMP 06/02/2017 Comment: hysterectomy 06/08/2017  BMI 28.49 kg/m  GENERAL:  well appearing, in no acute distress, alert  SKIN:  Color, texture, turgor normal. No rashes or lesions HEAD:  Normocephalic/atraumatic. RESP: normal respiratory effort MSK:  No gross joint deformities.   NEUROLOGICAL: Mental Status: Alert, oriented to person, place and time, Follows commands, and Speech fluent and appropriate. Cranial Nerves: PERRL, face symmetric, no dysarthria, hearing grossly intact Motor: moves all extremities equally Gait:  normal-based.  IMPRESSION: 47 year old female with a history of B12 deficiency, asthma, GERD, ulcerative colitis, s/p gastric bypass, chronic neck/low back pain, right carpal tunnel and ulnar neuropathy who presents for follow up of chronic migraines. While Botox helps with her migraines, she has a significant wearing off period with breakthrough lasting up to 4 days at a time. Will add Qulipta for migraine prevention and start Imitrex for rescue.  PLAN: -Prevention: Start Qulipta 30 mg daily, continue Botox every 3 months -Rescue: Start Imitrex 50 mg PRN -Next steps: consider Ajovy/Emgality, Vyepti for prevention  Follow-up: for Botox (scheduled March 2024)  I spent a total of 45 minutes on the date of the service. Headache education was done. Discussed treatment options including preventive and acute medications. Discussed medication side effects, adverse reactions and drug interactions. Written educational materials and patient instructions outlining all of the above were given.  Genia Harold, MD 11/02/22 1:42 PM

## 2022-11-02 NOTE — Patient Instructions (Signed)
Start Qulipta 30 mg daily for headache prevention  Start sumatriptan as needed for migraines. Take one pill at the onset of migraine. If headache recurs or does not fully resolve, you may take a second dose after 2 hours. Please avoid taking more than 2 days per week to avoid rebound headaches

## 2022-11-23 ENCOUNTER — Other Ambulatory Visit: Payer: Self-pay | Admitting: Neurology

## 2022-12-08 ENCOUNTER — Telehealth: Payer: Self-pay | Admitting: Neurology

## 2022-12-08 MED ORDER — ONABOTULINUMTOXINA 200 UNITS IJ SOLR: 200.0000 [IU] | INTRAMUSCULAR | 2 refills | Status: DC

## 2022-12-08 NOTE — Addendum Note (Signed)
Addended by: Kristen Loader on: 12/08/2022 11:13 AM   Modules accepted: Orders

## 2022-12-08 NOTE — Telephone Encounter (Signed)
Called [facility], they received new rx and I provided ICD-10 code. Rep stated she was sending rx to [facility], will check back in tomorrow.

## 2022-12-08 NOTE — Telephone Encounter (Addendum)
[facility] called stating they need a new Botox prescription sent over with IDC-10 code G43.711, the one they have now does not match what is on her prior auth. Please send asap.

## 2022-12-09 NOTE — Telephone Encounter (Signed)
[facility] is saying [employer] is not approving Botox because it is coming back as her secondary insurance. They state they will reach back out to [facility] to try and resolve this today. Informed pt that we need to cancel tomorrow's appointment and she'll need to call [facility] to discuss. Unable to reach by phone, sent text.

## 2022-12-10 ENCOUNTER — Ambulatory Visit: Payer: 59 | Admitting: Neurology

## 2022-12-10 NOTE — Telephone Encounter (Signed)
[facility] called this morning to update that they have still not heard back from the patient. If she calls [place], she needs to contact [facility] at [phone].

## 2023-01-07 ENCOUNTER — Other Ambulatory Visit (HOSPITAL_BASED_OUTPATIENT_CLINIC_OR_DEPARTMENT_OTHER): Payer: Self-pay

## 2023-01-07 MED ORDER — HYDROMORPHONE HCL 4 MG PO TABS
4.0000 mg | ORAL_TABLET | ORAL | 0 refills | Status: DC
  Filled 2023-01-12: qty 240, 30d supply, fill #0

## 2023-01-12 ENCOUNTER — Other Ambulatory Visit (HOSPITAL_BASED_OUTPATIENT_CLINIC_OR_DEPARTMENT_OTHER): Payer: Self-pay

## 2023-01-17 ENCOUNTER — Inpatient Hospital Stay (HOSPITAL_COMMUNITY)
Admission: EM | Admit: 2023-01-17 | Discharge: 2023-01-21 | DRG: 378 | Disposition: A | Discharge status: Home Health | Payer: 59 | Attending: Internal Medicine | Admitting: Internal Medicine

## 2023-01-17 ENCOUNTER — Encounter (HOSPITAL_COMMUNITY): Payer: Self-pay | Admitting: Family Medicine

## 2023-01-17 ENCOUNTER — Other Ambulatory Visit: Payer: Self-pay

## 2023-01-17 ENCOUNTER — Emergency Department (HOSPITAL_COMMUNITY): Payer: 59

## 2023-01-17 DIAGNOSIS — K219 Gastro-esophageal reflux disease without esophagitis: Secondary | ICD-10-CM | POA: Diagnosis present

## 2023-01-17 DIAGNOSIS — R110 Nausea: Secondary | ICD-10-CM

## 2023-01-17 DIAGNOSIS — F332 Major depressive disorder, recurrent severe without psychotic features: Secondary | ICD-10-CM | POA: Diagnosis not present

## 2023-01-17 DIAGNOSIS — E876 Hypokalemia: Secondary | ICD-10-CM | POA: Diagnosis not present

## 2023-01-17 DIAGNOSIS — Z87891 Personal history of nicotine dependence: Secondary | ICD-10-CM

## 2023-01-17 DIAGNOSIS — K922 Gastrointestinal hemorrhage, unspecified: Secondary | ICD-10-CM | POA: Diagnosis present

## 2023-01-17 DIAGNOSIS — Z79899 Other long term (current) drug therapy: Secondary | ICD-10-CM

## 2023-01-17 DIAGNOSIS — Z6828 Body mass index (BMI) 28.0-28.9, adult: Secondary | ICD-10-CM

## 2023-01-17 DIAGNOSIS — K51511 Left sided colitis with rectal bleeding: Secondary | ICD-10-CM

## 2023-01-17 DIAGNOSIS — E440 Moderate protein-calorie malnutrition: Secondary | ICD-10-CM | POA: Diagnosis present

## 2023-01-17 DIAGNOSIS — Z7985 Long-term (current) use of injectable non-insulin antidiabetic drugs: Secondary | ICD-10-CM

## 2023-01-17 DIAGNOSIS — D62 Acute posthemorrhagic anemia: Secondary | ICD-10-CM | POA: Diagnosis present

## 2023-01-17 DIAGNOSIS — Z833 Family history of diabetes mellitus: Secondary | ICD-10-CM

## 2023-01-17 DIAGNOSIS — Z803 Family history of malignant neoplasm of breast: Secondary | ICD-10-CM

## 2023-01-17 DIAGNOSIS — K254 Chronic or unspecified gastric ulcer with hemorrhage: Secondary | ICD-10-CM | POA: Diagnosis not present

## 2023-01-17 DIAGNOSIS — R109 Unspecified abdominal pain: Secondary | ICD-10-CM | POA: Diagnosis not present

## 2023-01-17 DIAGNOSIS — I493 Ventricular premature depolarization: Secondary | ICD-10-CM | POA: Diagnosis present

## 2023-01-17 DIAGNOSIS — R112 Nausea with vomiting, unspecified: Secondary | ICD-10-CM | POA: Diagnosis present

## 2023-01-17 DIAGNOSIS — Z86010 Personal history of colonic polyps: Secondary | ICD-10-CM

## 2023-01-17 DIAGNOSIS — G43909 Migraine, unspecified, not intractable, without status migrainosus: Secondary | ICD-10-CM | POA: Diagnosis present

## 2023-01-17 DIAGNOSIS — D649 Anemia, unspecified: Secondary | ICD-10-CM

## 2023-01-17 DIAGNOSIS — F419 Anxiety disorder, unspecified: Secondary | ICD-10-CM | POA: Diagnosis present

## 2023-01-17 DIAGNOSIS — E538 Deficiency of other specified B group vitamins: Secondary | ICD-10-CM | POA: Diagnosis present

## 2023-01-17 DIAGNOSIS — R195 Other fecal abnormalities: Secondary | ICD-10-CM | POA: Diagnosis present

## 2023-01-17 DIAGNOSIS — Z96653 Presence of artificial knee joint, bilateral: Secondary | ICD-10-CM | POA: Diagnosis present

## 2023-01-17 DIAGNOSIS — E860 Dehydration: Secondary | ICD-10-CM | POA: Diagnosis present

## 2023-01-17 DIAGNOSIS — Z87440 Personal history of urinary (tract) infections: Secondary | ICD-10-CM

## 2023-01-17 DIAGNOSIS — R1115 Cyclical vomiting syndrome unrelated to migraine: Secondary | ICD-10-CM | POA: Diagnosis present

## 2023-01-17 DIAGNOSIS — K5190 Ulcerative colitis, unspecified, without complications: Secondary | ICD-10-CM | POA: Diagnosis not present

## 2023-01-17 DIAGNOSIS — R519 Headache, unspecified: Secondary | ICD-10-CM

## 2023-01-17 DIAGNOSIS — R1010 Upper abdominal pain, unspecified: Principal | ICD-10-CM | POA: Diagnosis present

## 2023-01-17 DIAGNOSIS — D509 Iron deficiency anemia, unspecified: Secondary | ICD-10-CM | POA: Diagnosis present

## 2023-01-17 DIAGNOSIS — Z825 Family history of asthma and other chronic lower respiratory diseases: Secondary | ICD-10-CM

## 2023-01-17 DIAGNOSIS — K5904 Chronic idiopathic constipation: Secondary | ICD-10-CM | POA: Diagnosis present

## 2023-01-17 DIAGNOSIS — K5150 Left sided colitis without complications: Secondary | ICD-10-CM | POA: Diagnosis present

## 2023-01-17 DIAGNOSIS — K229 Disease of esophagus, unspecified: Secondary | ICD-10-CM | POA: Diagnosis present

## 2023-01-17 DIAGNOSIS — Z1152 Encounter for screening for COVID-19: Secondary | ICD-10-CM

## 2023-01-17 DIAGNOSIS — G8929 Other chronic pain: Secondary | ICD-10-CM | POA: Diagnosis present

## 2023-01-17 DIAGNOSIS — J45909 Unspecified asthma, uncomplicated: Secondary | ICD-10-CM | POA: Diagnosis present

## 2023-01-17 DIAGNOSIS — K284 Chronic or unspecified gastrojejunal ulcer with hemorrhage: Secondary | ICD-10-CM | POA: Diagnosis present

## 2023-01-17 DIAGNOSIS — Z9884 Bariatric surgery status: Secondary | ICD-10-CM

## 2023-01-17 DIAGNOSIS — M549 Dorsalgia, unspecified: Secondary | ICD-10-CM | POA: Diagnosis present

## 2023-01-17 DIAGNOSIS — Z8249 Family history of ischemic heart disease and other diseases of the circulatory system: Secondary | ICD-10-CM

## 2023-01-17 LAB — URINALYSIS, ROUTINE W REFLEX MICROSCOPIC
Bilirubin Urine: NEGATIVE
Glucose, UA: NEGATIVE mg/dL
Hgb urine dipstick: NEGATIVE
Ketones, ur: NEGATIVE mg/dL
Leukocytes,Ua: NEGATIVE
Nitrite: NEGATIVE
Protein, ur: NEGATIVE mg/dL
Specific Gravity, Urine: >1.046 — ABNORMAL HIGH (ref 1.005–1.030)
pH: 5 (ref 5.0–8.0)

## 2023-01-17 LAB — CBC
HCT: 34.7 % — ABNORMAL LOW (ref 36.0–46.0)
Hemoglobin: 11.5 g/dL — ABNORMAL LOW (ref 12.0–15.0)
MCH: 31 pg (ref 26.0–34.0)
MCHC: 33.1 g/dL (ref 30.0–36.0)
MCV: 93.5 fL (ref 80.0–100.0)
Platelets: 339 10*3/uL (ref 150–400)
RBC: 3.71 MIL/uL — ABNORMAL LOW (ref 3.87–5.11)
RDW: 13.7 % (ref 11.5–15.5)
WBC: 15.4 10*3/uL — ABNORMAL HIGH (ref 4.0–10.5)
nRBC: 0 % (ref 0.0–0.2)

## 2023-01-17 LAB — COMPREHENSIVE METABOLIC PANEL
ALT: 13 U/L (ref 0–44)
AST: 24 U/L (ref 15–41)
Albumin: 4.1 g/dL (ref 3.5–5.0)
Alkaline Phosphatase: 51 U/L (ref 38–126)
Anion gap: 14 (ref 5–15)
BUN: 22 mg/dL — ABNORMAL HIGH (ref 6–20)
CO2: 28 mmol/L (ref 22–32)
Calcium: 9.5 mg/dL (ref 8.9–10.3)
Chloride: 93 mmol/L — ABNORMAL LOW (ref 98–111)
Creatinine, Ser: 1.1 mg/dL — ABNORMAL HIGH (ref 0.44–1.00)
GFR, Estimated: >60 mL/min (ref >60)
Glucose, Bld: 149 mg/dL — ABNORMAL HIGH (ref 70–99)
Potassium: 4.2 mmol/L (ref 3.5–5.1)
Sodium: 135 mmol/L (ref 135–145)
Total Bilirubin: 0.8 mg/dL (ref 0.3–1.2)
Total Protein: 8.2 g/dL — ABNORMAL HIGH (ref 6.5–8.1)

## 2023-01-17 LAB — I-STAT CHEM 8, ED
BUN: 22 mg/dL — ABNORMAL HIGH (ref 6–20)
Calcium, Ion: 1.16 mmol/L (ref 1.15–1.40)
Chloride: 96 mmol/L — ABNORMAL LOW (ref 98–111)
Creatinine, Ser: 0.7 mg/dL (ref 0.44–1.00)
Glucose, Bld: 124 mg/dL — ABNORMAL HIGH (ref 70–99)
HCT: 33 % — ABNORMAL LOW (ref 36.0–46.0)
Hemoglobin: 11.2 g/dL — ABNORMAL LOW (ref 12.0–15.0)
Potassium: 3.7 mmol/L (ref 3.5–5.1)
Sodium: 135 mmol/L (ref 135–145)
TCO2: 31 mmol/L (ref 22–32)

## 2023-01-17 LAB — TROPONIN I (HIGH SENSITIVITY): Troponin I (High Sensitivity): 3 ng/L (ref <18)

## 2023-01-17 LAB — LIPASE, BLOOD: Lipase: 28 U/L (ref 11–51)

## 2023-01-17 MED ORDER — ACETAMINOPHEN 325 MG PO TABS
650.0000 mg | ORAL_TABLET | Freq: Four times a day (QID) | ORAL | Status: DC | PRN
  Administered 2023-01-18 – 2023-01-20 (×3): 650 mg via ORAL
  Filled 2023-01-17 (×3): qty 2

## 2023-01-17 MED ORDER — LINACLOTIDE 145 MCG PO CAPS: 290.0000 ug | ORAL_CAPSULE | Freq: Every day | ORAL | Status: DC

## 2023-01-17 MED ORDER — PANTOPRAZOLE SODIUM 40 MG PO TBEC
40.0000 mg | DELAYED_RELEASE_TABLET | Freq: Two times a day (BID) | ORAL | Status: DC
  Administered 2023-01-17 – 2023-01-21 (×8): 40 mg via ORAL
  Filled 2023-01-17 (×8): qty 1

## 2023-01-17 MED ORDER — BACLOFEN 10 MG PO TABS
20.0000 mg | ORAL_TABLET | Freq: Every day | ORAL | Status: DC
  Administered 2023-01-17 – 2023-01-20 (×4): 20 mg via ORAL
  Filled 2023-01-17 (×4): qty 2

## 2023-01-17 MED ORDER — SODIUM CHLORIDE 0.9 % IV BOLUS
1000.0000 mL | Freq: Once | INTRAVENOUS | Status: AC
  Administered 2023-01-17: 1000 mL via INTRAVENOUS

## 2023-01-17 MED ORDER — ATORVASTATIN CALCIUM 40 MG PO TABS
40.0000 mg | ORAL_TABLET | Freq: Every day | ORAL | Status: DC
  Administered 2023-01-18 – 2023-01-21 (×4): 40 mg via ORAL
  Filled 2023-01-17 (×4): qty 1

## 2023-01-17 MED ORDER — LORAZEPAM 1 MG PO TABS
1.0000 mg | ORAL_TABLET | Freq: Two times a day (BID) | ORAL | Status: DC | PRN
  Administered 2023-01-17 – 2023-01-20 (×3): 1 mg via ORAL
  Filled 2023-01-17 (×3): qty 1

## 2023-01-17 MED ORDER — DICLOFENAC SODIUM 1 % EX GEL
4.0000 g | Freq: Four times a day (QID) | CUTANEOUS | Status: DC
  Administered 2023-01-17 – 2023-01-21 (×12): 4 g via TOPICAL
  Filled 2023-01-17: qty 100

## 2023-01-17 MED ORDER — BUPROPION HCL ER (XL) 300 MG PO TB24
300.0000 mg | ORAL_TABLET | Freq: Every morning | ORAL | Status: DC
  Administered 2023-01-18: 300 mg via ORAL
  Filled 2023-01-17: qty 1

## 2023-01-17 MED ORDER — ONDANSETRON HCL 4 MG PO TABS
4.0000 mg | ORAL_TABLET | Freq: Four times a day (QID) | ORAL | Status: DC | PRN
  Administered 2023-01-19 – 2023-01-21 (×2): 4 mg via ORAL
  Filled 2023-01-17 (×2): qty 1

## 2023-01-17 MED ORDER — IOHEXOL 300 MG/ML  SOLN
100.0000 mL | Freq: Once | INTRAMUSCULAR | Status: AC | PRN
  Administered 2023-01-17: 100 mL via INTRAVENOUS

## 2023-01-17 MED ORDER — SODIUM CHLORIDE (PF) 0.9 % IJ SOLN
INTRAMUSCULAR | Status: AC
  Filled 2023-01-17: qty 50

## 2023-01-17 MED ORDER — HYDROMORPHONE HCL 1 MG/ML IJ SOLN
1.0000 mg | Freq: Once | INTRAMUSCULAR | Status: AC
  Administered 2023-01-17: 1 mg via INTRAVENOUS
  Filled 2023-01-17: qty 1

## 2023-01-17 MED ORDER — GABAPENTIN 300 MG PO CAPS
400.0000 mg | ORAL_CAPSULE | Freq: Three times a day (TID) | ORAL | Status: DC
  Administered 2023-01-17 – 2023-01-21 (×11): 400 mg via ORAL
  Filled 2023-01-17 (×11): qty 1

## 2023-01-17 MED ORDER — DULOXETINE HCL 60 MG PO CPEP
60.0000 mg | ORAL_CAPSULE | Freq: Two times a day (BID) | ORAL | Status: DC
  Administered 2023-01-17 – 2023-01-21 (×8): 60 mg via ORAL
  Filled 2023-01-17 (×8): qty 1

## 2023-01-17 MED ORDER — SUCRALFATE 1 G PO TABS
1.0000 g | ORAL_TABLET | Freq: Two times a day (BID) | ORAL | Status: DC
  Administered 2023-01-17 – 2023-01-20 (×6): 1 g via ORAL
  Filled 2023-01-17 (×6): qty 1

## 2023-01-17 MED ORDER — SODIUM CHLORIDE 0.9 % IV SOLN: INTRAVENOUS | Status: AC

## 2023-01-17 MED ORDER — HYDROMORPHONE HCL 1 MG/ML IJ SOLN
0.5000 mg | INTRAMUSCULAR | Status: DC | PRN
  Administered 2023-01-17 – 2023-01-18 (×2): 1 mg via INTRAVENOUS
  Administered 2023-01-18: 0.5 mg via INTRAVENOUS
  Administered 2023-01-18: 1 mg via INTRAVENOUS
  Filled 2023-01-17 (×4): qty 1

## 2023-01-17 MED ORDER — ONDANSETRON HCL 4 MG/2ML IJ SOLN
4.0000 mg | Freq: Once | INTRAMUSCULAR | Status: AC
  Administered 2023-01-17: 4 mg via INTRAVENOUS
  Filled 2023-01-17: qty 2

## 2023-01-17 MED ORDER — DICYCLOMINE HCL 10 MG PO CAPS
10.0000 mg | ORAL_CAPSULE | Freq: Two times a day (BID) | ORAL | Status: DC | PRN
  Administered 2023-01-17 – 2023-01-20 (×4): 10 mg via ORAL
  Filled 2023-01-17 (×4): qty 1

## 2023-01-17 MED ORDER — ACETAMINOPHEN 650 MG RE SUPP: 650.0000 mg | Freq: Four times a day (QID) | RECTAL | Status: DC | PRN

## 2023-01-17 MED ORDER — MIRTAZAPINE 15 MG PO TABS: 15.0000 mg | ORAL_TABLET | Freq: Every evening | ORAL | Status: DC | PRN

## 2023-01-17 MED ORDER — ONDANSETRON HCL 4 MG/2ML IJ SOLN
4.0000 mg | Freq: Four times a day (QID) | INTRAMUSCULAR | Status: DC | PRN
  Administered 2023-01-17 – 2023-01-18 (×2): 4 mg via INTRAVENOUS
  Filled 2023-01-17 (×2): qty 2

## 2023-01-17 MED ORDER — FENTANYL CITRATE PF 50 MCG/ML IJ SOSY
50.0000 ug | PREFILLED_SYRINGE | Freq: Once | INTRAMUSCULAR | Status: AC
  Administered 2023-01-17: 50 ug via INTRAVENOUS
  Filled 2023-01-17: qty 1

## 2023-01-17 MED ORDER — ALBUTEROL SULFATE (2.5 MG/3ML) 0.083% IN NEBU: 3.0000 mL | INHALATION_SOLUTION | Freq: Four times a day (QID) | RESPIRATORY_TRACT | Status: DC | PRN

## 2023-01-17 MED ORDER — BUPROPION HCL ER (XL) 150 MG PO TB24
150.0000 mg | ORAL_TABLET | Freq: Every morning | ORAL | Status: DC
  Administered 2023-01-18: 150 mg via ORAL
  Filled 2023-01-17: qty 1

## 2023-01-17 MED ORDER — ATOGEPANT 30 MG PO TABS: 30.0000 mg | ORAL_TABLET | Freq: Every day | ORAL | Status: DC

## 2023-01-17 MED ORDER — OXYBUTYNIN CHLORIDE 5 MG PO TABS
5.0000 mg | ORAL_TABLET | Freq: Every day | ORAL | Status: DC
  Administered 2023-01-18 – 2023-01-21 (×4): 5 mg via ORAL
  Filled 2023-01-17 (×4): qty 1

## 2023-01-17 MED ORDER — BACLOFEN 10 MG PO TABS
10.0000 mg | ORAL_TABLET | Freq: Two times a day (BID) | ORAL | Status: DC
  Administered 2023-01-18 – 2023-01-21 (×7): 10 mg via ORAL
  Filled 2023-01-17 (×7): qty 1

## 2023-01-17 MED ORDER — BUSPIRONE HCL 5 MG PO TABS
15.0000 mg | ORAL_TABLET | Freq: Three times a day (TID) | ORAL | Status: DC
  Administered 2023-01-17 – 2023-01-21 (×11): 15 mg via ORAL
  Filled 2023-01-17 (×11): qty 3

## 2023-01-17 MED ORDER — SODIUM CHLORIDE 0.9 % IV SOLN: 12.5000 mg | Freq: Four times a day (QID) | INTRAVENOUS | Status: DC | PRN

## 2023-01-17 MED ORDER — MESALAMINE 1.2 G PO TBEC
2.4000 g | DELAYED_RELEASE_TABLET | Freq: Every day | ORAL | Status: DC
  Administered 2023-01-18 – 2023-01-21 (×4): 2.4 g via ORAL
  Filled 2023-01-17 (×4): qty 2

## 2023-01-17 NOTE — ED Triage Notes (Signed)
C/o upper ad pain with n/v. Tender w/ palpitation.  [id number]  Also c/o unsteady gait with multiple falls this week.

## 2023-01-17 NOTE — ED Provider Notes (Signed)
[facility] Provider Note   CSN: [id number] Arrival date & time: 01/17/23  1618     History  Chief Complaint  Patient presents with   Abdominal Pain    [name] is a 47 y.o. female.  Patient is a poor 47 year old female who presents with abdominal pain.  She has a history of ulcerative colitis, GERD, gastric bypass, chronic neck and back pain, migraines.  She states that for the last 5 days she has had ongoing pain across her upper abdomen.  She has had associated nausea and vomiting.  She says she has not been able to eat or drink anything without vomiting.  She says she has not had a bowel movement since Tuesday.  She denies any known fevers.  She does have a little bit of burning on urination.  No history of similar symptoms in the past.  Her emesis is not on bilious.  She says occasionally she will have a few specks of blood but no ongoing hematemesis.  She says she has had several falls this week due to her being lightheaded when she gets up.  She feels generally weak all over but gets dizzy and lightheaded when she stands up.  She denies any injuries from the falls.       Home Medications Prior to Admission medications   Medication Sig Start Date End Date Taking? Authorizing Provider  albuterol (VENTOLIN HFA) 108 (90 Base) MCG/ACT inhaler Inhale 2 puffs into the lungs every 6 (six) hours as needed for wheezing or shortness of breath.   Yes [provider]  Atogepant (QULIPTA) 30 MG TABS Take 1 tablet (30 mg total) by mouth daily. 11/02/22  Yes Ocie Doyne, MD  atorvastatin (LIPITOR) 40 MG tablet Take 40 mg by mouth daily. 12/10/22  Yes [provider]  baclofen (LIORESAL) 10 MG tablet TAKE 1 TABLET BY MOUTH EVERYDAY AT BEDTIME Patient taking differently: Take 10 mg by mouth in the morning, at noon, and at bedtime. Take 1 tablet (10 mg) in morning, noon and Take 2 tablets (20 mg) at bedtime 12/18/20  Yes  Glean Salvo, NP  buPROPion (WELLBUTRIN XL) 150 MG 24 hr tablet Take 150 mg by mouth every morning. Take along with 300 mg=450 mg   Yes [provider]  buPROPion (WELLBUTRIN XL) 300 MG 24 hr tablet Take 300 mg by mouth every morning. Take along with 150 mg=450 mg 06/05/20  Yes [provider]  busPIRone (BUSPAR) 15 MG tablet Take 15 mg by mouth 3 (three) times daily.   Yes [provider]  celecoxib (CELEBREX) 200 MG capsule Take 1 capsule (200 mg total) by mouth 2 (two) times daily. 01/24/20  Yes Cassandria Anger, PA-C  Cholecalciferol (VITAMIN D3) 1.25 MG (50000 UT) CAPS Take 50,000 Units by mouth every 7 (seven) days.  04/03/19  Yes [provider]  cyanocobalamin (,VITAMIN B-12,) 1000 MCG/ML injection Inject 1,000 mcg into the muscle 2 (two) times a week.  04/06/19  Yes [provider]  diclofenac Sodium (VOLTAREN) 1 % GEL Apply topically 4 (four) times daily.   Yes [provider]  DULoxetine (CYMBALTA) 60 MG capsule Take 1 capsule (60 mg total) by mouth 2 (two) times daily. 04/10/19  Yes Aldean Baker, NP  EPINEPHrine 0.3 mg/0.3 mL IJ SOAJ injection Inject 0.3 mg into the muscle as needed for anaphylaxis. 04/12/21  Yes Haskel Schroeder, PA-C  ferrous sulfate 325 (65 FE) MG tablet  Take 1 tablet (325 mg total) by mouth 2 (two) times daily with a meal. Patient taking differently: Take 325 mg by mouth daily with breakfast. 01/24/20  Yes Cassandria Anger, PA-C  fluticasone (FLONASE) 50 MCG/ACT nasal spray Place 2 sprays into both nostrils daily as needed for allergies or rhinitis.   Yes [provider]  gabapentin (NEURONTIN) 400 MG capsule Take 400 mg by mouth 3 (three) times daily.   Yes [provider]  HYDROcodone-acetaminophen (NORCO/VICODIN) 5-325 MG tablet Take 1 tablet by mouth every 6 (six) hours as needed for moderate pain.   Yes [provider]  hydrocortisone (ANUSOL-HC) 2.5 % rectal cream Place rectally.  07/29/18  Yes [provider]  HYDROmorphone (DILAUDID) 4 MG tablet Take 1 tablet (4 mg total) by mouth every 3 (three) hours. 01/07/23  Yes   linaclotide (LINZESS) 290 MCG CAPS capsule Take 290 mcg by mouth daily before breakfast.   Yes [provider]  LORazepam (ATIVAN) 1 MG tablet Take 1 mg by mouth 2 (two) times daily as needed for anxiety.   Yes [provider]  magnesium oxide (MAG-OX) 400 (240 Mg) MG tablet Take 1 tablet by mouth daily. 12/19/22  Yes [provider]  meclizine (ANTIVERT) 25 MG tablet Take 25 mg by mouth 3 (three) times daily. 04/15/22  Yes [provider]  mesalamine (LIALDA) 1.2 g EC tablet Take 2.4 g by mouth daily with breakfast.   Yes [provider]  mirtazapine (REMERON) 15 MG tablet Take 15 mg by mouth at bedtime as needed (sleep).   Yes [provider]  Multiple Vitamin (MULTIVITAMIN WITH MINERALS) TABS tablet Take 1 tablet by mouth daily.   Yes [provider]  NARCAN 4 MG/0.1ML LIQD nasal spray kit Place 1 spray into the nose as directed. 03/18/20  Yes [provider]  ondansetron (ZOFRAN) 8 MG tablet Take 8 mg by mouth every 8 (eight) hours as needed for nausea or vomiting.   Yes [provider]  oxybutynin (DITROPAN) 5 MG tablet Take 5 mg by mouth daily.   Yes [provider]  OZEMPIC, 2 MG/DOSE, 8 MG/3ML SOPN Inject 2 mg into the skin once a week.   Yes [provider]  pantoprazole (PROTONIX) 40 MG tablet TAKE 1 TABLET BY MOUTH TWICE A DAY BEFORE A MEAL 02/22/20  Yes Iva Boop, MD  phentermine (ADIPEX-P) 37.5 MG tablet Take 37.5 mg by mouth daily before breakfast.   Yes [provider]  sucralfate (CARAFATE) 1 g tablet Take 1 g by mouth 2 (two) times daily. 06/12/20  Yes [provider]  SUMAtriptan (IMITREX) 50 MG tablet Take 1 tablet (50 mg total) by mouth every 2 (two) hours as needed for migraine. May repeat in 2 hours if headache  persists or recurs. 11/02/22  Yes Ocie Doyne, MD  SYMPROIC 0.2 MG TABS Take 1 tablet by mouth daily. 05/13/21  Yes [provider]  thiamine (VITAMIN B-1) 100 MG tablet Take 100 mg by mouth daily at 12 noon.    Yes [provider]  torsemide (DEMADEX) 10 MG tablet Take 10 mg by mouth daily. 11/10/21  Yes [provider]  botulinum toxin Type A (BOTOX) 200 units injection INJECT 155 UNITS INTRAMUSCULARLY INTO HEAD AND NECK MUSCLES EVERY 3 MONTHS 11/23/22   Levert Feinstein, MD  botulinum toxin Type A (BOTOX) 200 units injection Inject 200 Units into the muscle every 3 (three) months. Inject into head, neck and shoulders every three  months Patient not taking: Reported on 01/17/2023 12/08/22   Glean Salvo, NP  dicyclomine (BENTYL) 10 MG capsule Take 10 mg by mouth 2 (two) times daily as needed. 12/17/20   [provider]  HYDROmorphone (DILAUDID) 4 MG tablet Take 1-2 tablets (4-8 mg total) by mouth every 4 (four) hours as needed for moderate pain or severe pain. Patient not taking: Reported on 01/17/2023 01/24/20   Cassandria Anger, PA-C  ibuprofen (ADVIL) 600 MG tablet Take 1 tablet (600 mg total) by mouth every 6 (six) hours as needed. Patient not taking: Reported on 01/17/2023 11/27/20   Lorre Nick, MD  metoCLOPramide (REGLAN) 10 MG tablet TAKE 1 TABLET (10 MG TOTAL) BY MOUTH 3 (THREE) TIMES DAILY BEFORE MEALS. Patient not taking: Reported on 01/17/2023 01/15/20   Iva Boop, MD      Allergies    Coconut (cocos nucifera), Coconut oil, Morphine and related, Oxycodone, Oxycodone-acetaminophen, Ciprofloxacin, Penicillamine, Trazodone and nefazodone, Vistaril [hydroxyzine hcl], Doxycycline, Silicone, Penicillins, and Tape    Review of Systems   Review of Systems  Constitutional:  Positive for fatigue. Negative for chills, diaphoresis and fever.  HENT:  Negative for congestion, rhinorrhea and sneezing.   Eyes: Negative.   Respiratory:  Negative for cough, chest  tightness and shortness of breath.   Cardiovascular:  Negative for chest pain and leg swelling.  Gastrointestinal:  Positive for abdominal pain, nausea and vomiting. Negative for blood in stool and diarrhea.  Genitourinary:  Negative for difficulty urinating, flank pain, frequency and hematuria.  Musculoskeletal:  Negative for arthralgias and back pain.  Skin:  Negative for rash.  Neurological:  Positive for weakness (Generalized) and light-headedness. Negative for dizziness, speech difficulty, numbness and headaches.    Physical Exam Updated Vital Signs BP 124/81   Pulse 84   Temp 98.7 F (37.1 C) (Oral)   Resp 15   Wt 80 kg   LMP 06/02/2017 Comment: hysterectomy 06/08/2017  SpO2 100%   BMI 28.47 kg/m  Physical Exam Constitutional:      Appearance: She is well-developed.  HENT:     Head: Normocephalic and atraumatic.  Eyes:     Pupils: Pupils are equal, round, and reactive to light.  Cardiovascular:     Rate and Rhythm: Regular rhythm. Tachycardia present.     Heart sounds: Normal heart sounds.  Pulmonary:     Effort: Pulmonary effort is normal. No respiratory distress.     Breath sounds: Normal breath sounds. No wheezing or rales.  Chest:     Chest wall: No tenderness.  Abdominal:     General: Bowel sounds are normal.     Palpations: Abdomen is soft.     Tenderness: There is abdominal tenderness in the right upper quadrant, epigastric area and left upper quadrant. There is no guarding or rebound.  Musculoskeletal:        General: Normal range of motion.     Cervical back: Normal range of motion and neck supple.  Lymphadenopathy:     Cervical: No cervical adenopathy.  Skin:    General: Skin is warm and dry.     Findings: No rash.  Neurological:     Mental Status: She is alert and oriented to person, place, and time.     ED Results / Procedures / Treatments   Labs (all labs ordered are listed, but only abnormal results are displayed) Labs Reviewed   COMPREHENSIVE METABOLIC PANEL - Abnormal; Notable for the following components:      Result  Value   Chloride 93 (*)    Glucose, Bld 149 (*)    BUN 22 (*)    Creatinine, Ser 1.10 (*)    Total Protein 8.2 (*)    All other components within normal limits  CBC - Abnormal; Notable for the following components:   WBC 15.4 (*)    RBC 3.71 (*)    Hemoglobin 11.5 (*)    HCT 34.7 (*)    All other components within normal limits  URINALYSIS, ROUTINE W REFLEX MICROSCOPIC - Abnormal; Notable for the following components:   Specific Gravity, Urine >1.046 (*)    All other components within normal limits  I-STAT CHEM 8, ED - Abnormal; Notable for the following components:   Chloride 96 (*)    BUN 22 (*)    Glucose, Bld 124 (*)    Hemoglobin 11.2 (*)    HCT 33.0 (*)    All other components within normal limits  LIPASE, BLOOD  TROPONIN I (HIGH SENSITIVITY)    EKG EKG Interpretation  Date/Time:  Sunday January 17 2023 16:43:54 EDT Ventricular Rate:  108 PR Interval:  142 QRS Duration: 72 QT Interval:  310 QTC Calculation: 416 R Axis:   42 Text Interpretation: Sinus tachycardia Ventricular premature complex Probable left atrial enlargement SINCE LAST TRACING HEART RATE HAS INCREASED Confirmed by Rolan Bucco [phone]) on 01/17/2023 4:45:52 PM  Radiology CT ABDOMEN PELVIS W CONTRAST  Result Date: 01/17/2023 CLINICAL DATA:  Bowel obstruction suspected EXAM: CT ABDOMEN AND PELVIS WITH CONTRAST TECHNIQUE: Multidetector CT imaging of the abdomen and pelvis was performed using the standard protocol following bolus administration of intravenous contrast. RADIATION DOSE REDUCTION: This exam was performed according to the departmental dose-optimization program which includes automated exposure control, adjustment of the mA and/or kV according to patient size and/or use of iterative reconstruction technique. CONTRAST:  OMNIPAQUE IOHEXOL 300 MG/ML  SOLN COMPARISON:  09/28/2021 FINDINGS: Lower chest:  No acute abnormality Hepatobiliary: No focal hepatic abnormality. Gallbladder unremarkable. Pancreas: No focal abnormality or ductal dilatation. Spleen: 2 cm low-density lesion in the spleen, stable since prior study, likely cyst. Normal size. Adrenals/Urinary Tract: No focal hepatic abnormality. Gallbladder unremarkable. Stomach/Bowel: Normal appendix. Prior gastric bypass. There is mild wall thickening within the jejunal loop of small bowel extending from the gastrojejunostomy. This may reflect enteritis. No evidence of bowel obstruction. Normal appendix. Large bowel unremarkable. Vascular/Lymphatic: No evidence of aneurysm or adenopathy. Reproductive: Prior hysterectomy.  No adnexal masses. Other: No free fluid or free air. Musculoskeletal: No acute bony abnormality. IMPRESSION: Prior gastric bypass. The small bowel loop extending from the stomach anastomosis appears thick walled. This may reflect enteritis. Remainder of the small bowel is unremarkable. No bowel obstruction. Electronically Signed   By: Charlett Nose M.D.   On: 01/17/2023 18:09    Procedures Procedures    Medications Ordered in ED Medications  sodium chloride 0.9 % bolus 1,000 mL (0 mLs Intravenous Stopped 01/17/23 1851)  fentaNYL (SUBLIMAZE) injection 50 mcg (50 mcg Intravenous Given 01/17/23 1708)  ondansetron (ZOFRAN) injection 4 mg (4 mg Intravenous Given 01/17/23 1709)  iohexol (OMNIPAQUE) 300 MG/ML solution 100 mL (100 mLs Intravenous Contrast Given 01/17/23 1738)  fentaNYL (SUBLIMAZE) injection 50 mcg (50 mcg Intravenous Given 01/17/23 1820)  sodium chloride 0.9 % bolus 1,000 mL (0 mLs Intravenous Stopped 01/17/23 2026)  HYDROmorphone (DILAUDID) injection 1 mg (1 mg Intravenous Given 01/17/23 2032)    ED Course/ Medical Decision Making/ A&P  Medical Decision Making Amount and/or Complexity of Data Reviewed Labs: ordered. Radiology: ordered.  Risk Prescription drug management. Decision regarding  hospitalization.   Patient is a 47 year old female who presents with upper abdominal pain and vomiting.  She was tachycardic on arrival with a heart rate of 118.  She had diffuse pain across her upper abdomen.  Her labs show an elevated WBC count but otherwise nonconcerning.  No evidence of pancreatitis.  Her LFTs are normal.  She had a CT scan which shows some inflammation of the small bowel consistent with possible enteritis, no other acute findings.  She was treated with IV fluids associated with pain medications including both fentanyl and Dilaudid and antiemetics.  She initially was feeling symptomatically better but anytime she tries to drink, her pain returns and she required more pain medication.  She says that she has ongoing nausea and is unable to drink.  I spoke with Dr. Antionette Char given her lack of improvement and he has agreed to admit the patient for observation and further treatment as needed.  Final Clinical Impression(s) / ED Diagnoses Final diagnoses:  Pain of upper abdomen  Nausea    Rx / DC Orders ED Discharge Orders     None         Rolan Bucco, MD 01/17/23 2215

## 2023-01-17 NOTE — H&P (Signed)
History and Physical    [name] ZOX:[id number] DOB: [birth year] DOA: 01/17/2023  PCP: Felix Pacini, FNP   Patient coming from: Home   Chief Complaint: Abdominal pain, N/V   HPI: [name] is a 47 y.o. female with medical history significant for depression, anxiety, chronic migraine, ulcerative colitis, and history of gastric bypass who presents with upper abdominal pain, nausea, and vomiting.  Patient reports developing upper abdominal pain with nausea and vomiting on 01/12/2023.  Since then, pain has worsened and become more generalized.  She denies any fever, chills, or diarrhea associated with this.  She has been unable to tolerate even liquids at home unable to control her pain, and came to the ED for evaluation.  ED Course: Upon arrival to the ED, patient is found to be afebrile and saturating well on room air with mild tachycardia and stable blood pressure.  EKG reveals sinus tachycardia with PVC.  CT of the abdomen and pelvis is notable for prior gastric bypass and thick-walled appearance to a small bowel loop extending from the stomach anastomosis.  Labs are notable for normal LFTs, normal lipase, and WBC 15,400.  Patient was treated with IV fluids, 2 doses of fentanyl, Dilaudid, and Zofran in the ED.  Review of Systems:  All other systems reviewed and apart from HPI, are negative.  Past Medical History:  Diagnosis Date   Anal fissure - posterior 10/16/2014    OCC  ISSUES   Anxiety    doesn't take anything   Asthma    has Albuterol inhaler as needed   Boil    on pubic area started septra 03-01-17 draining blood and pus   Bronchitis    Chronic headache disorder 07/22/2016   Clostridium difficile infection 04/20/2012   Colitis    Dehydration    Depression    Family history of adverse reaction to anesthesia    pt mom gets sick   GERD (gastroesophageal reflux disease)    takes Pantoprazole daily   Headache    Hiatal hernia    neuropathy - mild in  arms and legs   History of blood transfusion    last transfusion was 04/04/2016=Benadryl was given d/t itching. States she always itches with transfusion.    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   Marginal ulcer 10/25/2017   Migraine    Motion sickness    Nausea    takes Zofran as needed   Nausea and vomiting    for 1 year   Obesity    Oligouria    Osteoarthritis    lower back, knees, wrists - no meds   Peripheral neuropathy 03/01/2019   Pneumonia 1997   Postoperative nausea and vomiting 01/21/2016   wants scopolamine patch   Right carpal tunnel syndrome 03/01/2019   SVD (spontaneous vaginal delivery)    x 4   Tingling    BOTH LOWER EXTREMETIES ALL THE TIME DUE TO RAPID WEIGHT LOSS   TOBACCO USER 10/02/2009   Qualifier: Diagnosis of  By: Carolyne Fiscal MD, Clayburn Pert  Transfusion history    last transfusion 6'16    Tremor 08/31/2018   UC (ulcerative colitis)    supposed to be taking Lialda and Bentyl but has been off since gastric sleeve   Vertigo    doesn't take any meds    Past Surgical History:  Procedure Laterality Date   ABDOMINAL HYSTERECTOMY     PARTIAL   COLONOSCOPY  2007   for rectal bleeding; [facility]   COLONOSCOPY N/A 06/11/2014   Procedure: COLONOSCOPY;  Surgeon: Iva Boop, MD;  Location: [facility];  Service: Endoscopy;  Laterality: N/A;   COLONOSCOPY WITH PROPOFOL N/A 03/02/2017   Procedure: COLONOSCOPY WITH PROPOFOL;  Surgeon: Ovidio Kin, MD;  Location: [facility];  Service: General;  Laterality: N/A;   DILATION AND CURETTAGE OF UTERUS     ESOPHAGOGASTRODUODENOSCOPY (EGD) WITH PROPOFOL N/A 04/06/2016   Procedure:  ESOPHAGOGASTRODUODENOSCOPY (EGD) WITH PROPOFOL;  Surgeon: Ovidio Kin, MD;  Location: [facility];  Service: General;  Laterality: N/A;   ESOPHAGOGASTRODUODENOSCOPY (EGD) WITH PROPOFOL N/A 03/02/2017   Procedure: ESOPHAGOGASTRODUODENOSCOPY (EGD) WITH PROPOFOL;  Surgeon: Ovidio Kin, MD;  Location: [facility];  Service: General;  Laterality: N/A;   ESOPHAGOGASTRODUODENOSCOPY (EGD) WITH PROPOFOL N/A 05/20/2017   Procedure: ESOPHAGOGASTRODUODENOSCOPY (EGD) WITH PROPOFOL ERAS PATHWAY;  Surgeon: Ovidio Kin, MD;  Location: [facility];  Service: General;  Laterality: N/A;   ESOPHAGOGASTRODUODENOSCOPY (EGD) WITH PROPOFOL N/A 10/07/2017   Procedure: ESOPHAGOGASTRODUODENOSCOPY (EGD) WITH PROPOFOL;  Surgeon: Ovidio Kin, MD;  Location: [facility];  Service: General;  Laterality: N/A;   EXCISION OF SKIN TAG  06/08/2017   Procedure: EXCISION OF VULVAR SKIN TAGS X2;  Surgeon: Reva Bores, MD;  Location: [facility];  Service: Gynecology;;   FOOT SURGERY Bilateral    x 2   GASTRIC ROUX-EN-Y N/A 12/14/2016   Procedure: LAPAROSCOPIC REVISION SLEEVE GASTRECTOMY TO  ROUX-Y-GASTRIC BY-PASS, UPPER ENDO;  Surgeon: Glenna Fellows, MD;  Location: [facility];  Service: General;  Laterality: N/A;   GASTROJEJUNOSTOMY N/A 05/11/2016   Procedure: LAPAROSCOPIC PLACEMENT  OF FEEDING  JEJUNOSTOMY TUBE;  Surgeon: Glenna Fellows, MD;  Location: [facility];  Service: General;  Laterality: N/A;   HEMORRHOIDECTOMY WITH HEMORRHOID BANDING     IR FLUORO GUIDE CV LINE RIGHT  04/06/2017   IR GENERIC HISTORICAL  05/19/2016   IR CM INJ ANY COLONIC TUBE W/FLUORO 05/19/2016 Irish Lack, MD [facility]   IR PATIENT EVAL TECH 0-60 MINS  11/30/2017   IR REPLC DUODEN/JEJUNO TUBE PERCUT W/FLUORO  03/14/2018   IR REPLC DUODEN/JEJUNO TUBE PERCUT W/FLUORO  03/15/2018   IR US GUIDE VASC ACCESS RIGHT  04/06/2017   j tube removed march 2018     KNEE ARTHROSCOPY Left 09/06/2014   LAPAROSCOPIC GASTRIC SLEEVE RESECTION N/A 01/14/2016   Procedure:  LAPAROSCOPIC GASTRIC SLEEVE RESECTION;  Surgeon: Glenna Fellows, MD;  Location: [facility];  Service: General;  Laterality: N/A;   LAPAROSCOPIC REVISION OF GASTROJEJUNOSTOMY N/A 10/25/2017   Procedure: LAPAROSCOPIC REVISION OF GASTROJEJUNOSTOMY AND PARTIAL GASTRECTOMY, WITH PLACEMENT OF FEEDING GASTROSTOMY TUBE;  Surgeon: Glenna Fellows, MD;  Location: [facility];  Service: General;  Laterality: N/A;   LAPAROSCOPIC TUBAL LIGATION  10/16/2011   Procedure: LAPAROSCOPIC TUBAL LIGATION;  Surgeon: Esmeralda Arthur, MD;  Location: [facility];  Service: Gynecology;  Laterality: Bilateral;   NOVASURE ABLATION  09/28/2010   mild persistent vaginal bleeding   right knee arthroscopy     05/07/2016   TOTAL KNEE ARTHROPLASTY Left 03/10/2018   Procedure: LEFT TOTAL KNEE ARTHROPLASTY;  Surgeon: Durene Romans,  MD;  Location: [facility];  Service: Orthopedics;  Laterality: Left;  70 mins   TOTAL KNEE ARTHROPLASTY Right 01/23/2020   Procedure: TOTAL KNEE ARTHROPLASTY, BILATERAL TROCHANTERIC INJECTION;  Surgeon: Durene Romans, MD;  Location: [facility];  Service: Orthopedics;  Laterality: Right;  70 mins for Bilateral Troch injection 1 cc Depo and 2 CC Lidocaine   TUBAL LIGATION     VAGINAL HYSTERECTOMY Bilateral 06/08/2017   Procedure: HYSTERECTOMY VAGINAL W/ BILATERAL SALPINGECTOMY;  Surgeon: Reva Bores, MD;  Location: [facility];  Service: Gynecology;  Laterality: Bilateral;   wisdom teeth extracted      Social History:   reports that she quit smoking about 8 years ago. Her smoking use included cigarettes. She has a 10.00 pack-year smoking history. She has never used smokeless tobacco. She reports current alcohol use. She reports that she does not use drugs.  Allergies  Allergen Reactions   Coconut (Cocos Nucifera) Anaphylaxis and Itching   Coconut Oil Anaphylaxis, Itching and Other (See Comments)   Morphine And Related Anaphylaxis and Hives    Pt states that she has tolerated Norco and Dilaudid   Oxycodone Anaphylaxis    Oxycodone-Acetaminophen Anaphylaxis   Ciprofloxacin Itching and Rash   Penicillamine Hives and Other (See Comments)   Trazodone And Nefazodone Rash   Vistaril [Hydroxyzine Hcl] Hives   Doxycycline Nausea And Vomiting   Silicone    Penicillins Rash    Has patient had a PCN reaction causing immediate rash, facial/tongue/throat swelling, SOB or lightheadedness with hypotension: Yes Has patient had a PCN reaction causing severe rash involving mucus membranes or skin necrosis: No Has patient had a PCN reaction that required hospitalization No Has patient had a PCN reaction occurring within the last 10 years: No If all of the above answers are "NO", then may proceed with Cephalosporin use.   Tape Rash    And paper tape causes a rash if wearing for a prolong period of time    Family History  Problem Relation Age of Onset   Hypertension Mother    Diabetes Mother    Sarcoidosis Mother        lungs and skin   Asthma Mother    Hypertension Father    Diabetes Father    Asthma Son    Tics Son    Asthma Sister    Cancer Sister        possible pancreatic cancer   Adrenal disorder Sister        Tumor    Asthma Brother    Asthma Daughter        died age 58.5   Cancer Daughter 4       brain; died age 58.5   Asthma Son    Cancer Paternal Aunt        brain, colon, lung and esophagus; unsure of primary    Breast cancer Paternal Aunt      Prior to Admission medications   Medication Sig Start Date End Date Taking? Authorizing Provider  albuterol (VENTOLIN HFA) 108 (90 Base) MCG/ACT inhaler Inhale 2 puffs into the lungs every 6 (six) hours as needed for wheezing or shortness of breath.   Yes [provider]  Atogepant (QULIPTA) 30 MG TABS Take 1 tablet (30 mg total) by mouth daily. 11/02/22  Yes Ocie Doyne, MD  atorvastatin (LIPITOR) 40 MG tablet Take 40 mg by mouth daily. 12/10/22  Yes [provider]  baclofen (LIORESAL) 10 MG tablet TAKE 1 TABLET BY MOUTH EVERYDAY AT  BEDTIME Patient taking differently: Take 10 mg by mouth in the morning, at noon, and at bedtime. Take 1 tablet (10 mg) in morning, noon and Take 2 tablets (20 mg) at bedtime 12/18/20  Yes Glean Salvo, NP  buPROPion (WELLBUTRIN XL) 150 MG 24 hr tablet Take 150 mg by mouth every morning. Take along with 300 mg=450 mg   Yes [provider]  buPROPion (WELLBUTRIN XL) 300 MG 24 hr tablet Take 300 mg by mouth every morning. Take along with 150 mg=450 mg 06/05/20  Yes [provider]  busPIRone (BUSPAR) 15 MG tablet Take 15 mg by mouth 3 (three) times daily.   Yes [provider]  celecoxib (CELEBREX) 200 MG capsule Take 1 capsule (200 mg total) by mouth 2 (two) times daily. 01/24/20  Yes Cassandria Anger, PA-C  Cholecalciferol (VITAMIN D3) 1.25 MG (50000 UT) CAPS Take 50,000 Units by mouth every 7 (seven) days.  04/03/19  Yes [provider]  cyanocobalamin (,VITAMIN B-12,) 1000 MCG/ML injection Inject 1,000 mcg into the muscle 2 (two) times a week.  04/06/19  Yes [provider]  diclofenac Sodium (VOLTAREN) 1 % GEL Apply topically 4 (four) times daily.   Yes [provider]  DULoxetine (CYMBALTA) 60 MG capsule Take 1 capsule (60 mg total) by mouth 2 (two) times daily. 04/10/19  Yes Aldean Baker, NP  EPINEPHrine 0.3 mg/0.3 mL IJ SOAJ injection Inject 0.3 mg into the muscle as needed for anaphylaxis. 04/12/21  Yes Haskel Schroeder, PA-C  ferrous sulfate 325 (65 FE) MG tablet Take 1 tablet (325 mg total) by mouth 2 (two) times daily with a meal. Patient taking differently: Take 325 mg by mouth daily with breakfast. 01/24/20  Yes Cassandria Anger, PA-C  fluticasone (FLONASE) 50 MCG/ACT nasal spray Place 2 sprays into both nostrils daily as needed for allergies or rhinitis.   Yes [provider]  gabapentin (NEURONTIN) 400 MG capsule Take 400 mg by mouth 3 (three) times daily.   Yes [provider]  HYDROcodone-acetaminophen  (NORCO/VICODIN) 5-325 MG tablet Take 1 tablet by mouth every 6 (six) hours as needed for moderate pain.   Yes [provider]  hydrocortisone (ANUSOL-HC) 2.5 % rectal cream Place rectally. 07/29/18  Yes [provider]  HYDROmorphone (DILAUDID) 4 MG tablet Take 1 tablet (4 mg total) by mouth every 3 (three) hours. 01/07/23  Yes   linaclotide (LINZESS) 290 MCG CAPS capsule Take 290 mcg by mouth daily before breakfast.   Yes [provider]  LORazepam (ATIVAN) 1 MG tablet Take 1 mg by mouth 2 (two) times daily as needed for anxiety.   Yes [provider]  magnesium oxide (MAG-OX) 400 (240 Mg) MG tablet Take 1 tablet by mouth daily. 12/19/22  Yes [provider]  meclizine (ANTIVERT) 25 MG tablet Take 25 mg by mouth 3 (three) times daily. 04/15/22  Yes [provider]  mesalamine (LIALDA) 1.2 g EC tablet Take 2.4 g by mouth daily with breakfast.   Yes [provider]  mirtazapine (REMERON) 15 MG tablet Take 15 mg by mouth at bedtime as needed (sleep).   Yes [provider]  Multiple Vitamin (MULTIVITAMIN WITH MINERALS) TABS tablet Take 1 tablet by mouth daily.   Yes [provider]  NARCAN 4 MG/0.1ML LIQD nasal spray kit Place 1 spray into the nose as directed. 03/18/20  Yes [provider]  ondansetron (ZOFRAN) 8 MG tablet Take 8 mg by mouth every 8 (eight)  hours as needed for nausea or vomiting.   Yes [provider]  oxybutynin (DITROPAN) 5 MG tablet Take 5 mg by mouth daily.   Yes [provider]  OZEMPIC, 2 MG/DOSE, 8 MG/3ML SOPN Inject 2 mg into the skin once a week.   Yes [provider]  pantoprazole (PROTONIX) 40 MG tablet TAKE 1 TABLET BY MOUTH TWICE A DAY BEFORE A MEAL 02/22/20  Yes Iva Boop, MD  phentermine (ADIPEX-P) 37.5 MG tablet Take 37.5 mg by mouth daily before breakfast.   Yes [provider]  sucralfate (CARAFATE) 1 g tablet Take 1 g by mouth 2 (two) times  daily. 06/12/20  Yes [provider]  SUMAtriptan (IMITREX) 50 MG tablet Take 1 tablet (50 mg total) by mouth every 2 (two) hours as needed for migraine. May repeat in 2 hours if headache persists or recurs. 11/02/22  Yes Ocie Doyne, MD  SYMPROIC 0.2 MG TABS Take 1 tablet by mouth daily. 05/13/21  Yes [provider]  thiamine (VITAMIN B-1) 100 MG tablet Take 100 mg by mouth daily at 12 noon.    Yes [provider]  torsemide (DEMADEX) 10 MG tablet Take 10 mg by mouth daily. 11/10/21  Yes [provider]  botulinum toxin Type A (BOTOX) 200 units injection INJECT 155 UNITS INTRAMUSCULARLY INTO HEAD AND NECK MUSCLES EVERY 3 MONTHS 11/23/22   Levert Feinstein, MD  botulinum toxin Type A (BOTOX) 200 units injection Inject 200 Units into the muscle every 3 (three) months. Inject into head, neck and shoulders every three months Patient not taking: Reported on 01/17/2023 12/08/22   Glean Salvo, NP  dicyclomine (BENTYL) 10 MG capsule Take 10 mg by mouth 2 (two) times daily as needed. 12/17/20   [provider]  HYDROmorphone (DILAUDID) 4 MG tablet Take 1-2 tablets (4-8 mg total) by mouth every 4 (four) hours as needed for moderate pain or severe pain. Patient not taking: Reported on 01/17/2023 01/24/20   Cassandria Anger, PA-C  ibuprofen (ADVIL) 600 MG tablet Take 1 tablet (600 mg total) by mouth every 6 (six) hours as needed. Patient not taking: Reported on 01/17/2023 11/27/20   Lorre Nick, MD  metoCLOPramide (REGLAN) 10 MG tablet TAKE 1 TABLET (10 MG TOTAL) BY MOUTH 3 (THREE) TIMES DAILY BEFORE MEALS. Patient not taking: Reported on 01/17/2023 01/15/20   Iva Boop, MD    Physical Exam: Vitals:   01/17/23 1730 01/17/23 1820 01/17/23 2015 01/17/23 2131  BP: (!) 133/98 128/89 124/81   Pulse: 88 (!) 101 84   Resp:  18 15   Temp:    98.7 F (37.1 C)  TempSrc:    Oral  SpO2: 100% 99% 100%   Weight:        Constitutional: NAD, no pallor or diaphoresis    Eyes: PERTLA, lids and conjunctivae normal ENMT: Mucous membranes are moist. Posterior pharynx clear of any exudate or lesions.   Neck: supple, no masses  Respiratory: no wheezing, no crackles. No accessory muscle use.  Cardiovascular: S1 & S2 heard, regular rate and rhythm. No extremity edema.   Abdomen: Soft, no distension, generally tender without rebound pain or guarding. Bowel sounds active.  Musculoskeletal: no clubbing / cyanosis. No joint deformity upper and lower extremities.   Skin: no significant rashes, lesions, ulcers. Warm, dry, well-perfused. Neurologic: CN 2-12 grossly intact. Moving all extremities. Alert and oriented.  Psychiatric: Calm. Cooperative.    Labs and Imaging on Admission: I have personally reviewed  following labs and imaging studies  CBC: Recent Labs  Lab 01/17/23 1634 01/17/23 1723  WBC 15.4*  --   HGB 11.5* 11.2*  HCT 34.7* 33.0*  MCV 93.5  --   PLT 339  --    Basic Metabolic Panel: Recent Labs  Lab 01/17/23 1634 01/17/23 1723  NA 135 135  K 4.2 3.7  CL 93* 96*  CO2 28  --   GLUCOSE 149* 124*  BUN 22* 22*  CREATININE 1.10* 0.70  CALCIUM 9.5  --    GFR: Estimated Creatinine Clearance: 95.8 mL/min (by C-G formula based on SCr of 0.7 mg/dL). Liver Function Tests: Recent Labs  Lab 01/17/23 1634  AST 24  ALT 13  ALKPHOS 51  BILITOT 0.8  PROT 8.2*  ALBUMIN 4.1   Recent Labs  Lab 01/17/23 1634  LIPASE 28   No results for input(s): "AMMONIA" in the last 168 hours. Coagulation Profile: No results for input(s): "INR", "PROTIME" in the last 168 hours. Cardiac Enzymes: No results for input(s): "CKTOTAL", "CKMB", "CKMBINDEX", "TROPONINI" in the last 168 hours. BNP (last 3 results) No results for input(s): "PROBNP" in the last 8760 hours. HbA1C: No results for input(s): "HGBA1C" in the last 72 hours. CBG: No results for input(s): "GLUCAP" in the last 168 hours. Lipid Profile: No results for input(s): "CHOL", "HDL", "LDLCALC",  "TRIG", "CHOLHDL", "LDLDIRECT" in the last 72 hours. Thyroid Function Tests: No results for input(s): "TSH", "T4TOTAL", "FREET4", "T3FREE", "THYROIDAB" in the last 72 hours. Anemia Panel: No results for input(s): "VITAMINB12", "FOLATE", "FERRITIN", "TIBC", "IRON", "RETICCTPCT" in the last 72 hours. Urine analysis:    Component Value Date/Time   COLORURINE YELLOW 01/17/2023 1808   APPEARANCEUR CLEAR 01/17/2023 1808   APPEARANCEUR Cloudy (A) 12/15/2017 1722   LABSPEC >1.046 (H) 01/17/2023 1808   PHURINE 5.0 01/17/2023 1808   GLUCOSEU NEGATIVE 01/17/2023 1808   HGBUR NEGATIVE 01/17/2023 1808   BILIRUBINUR NEGATIVE 01/17/2023 1808   BILIRUBINUR negative 01/05/2020 1434   BILIRUBINUR Negative 12/15/2017 1722   KETONESUR NEGATIVE 01/17/2023 1808   PROTEINUR NEGATIVE 01/17/2023 1808   UROBILINOGEN 0.2 01/05/2020 1434   UROBILINOGEN 0.2 11/29/2018 1742   NITRITE NEGATIVE 01/17/2023 1808   LEUKOCYTESUR NEGATIVE 01/17/2023 1808   Sepsis Labs: (procalcitonin:4,lacticidven:4) )No results found for this or any previous visit (from the past 240 hour(s)).   Radiological Exams on Admission: CT ABDOMEN PELVIS W CONTRAST  Result Date: 01/17/2023 CLINICAL DATA:  Bowel obstruction suspected EXAM: CT ABDOMEN AND PELVIS WITH CONTRAST TECHNIQUE: Multidetector CT imaging of the abdomen and pelvis was performed using the standard protocol following bolus administration of intravenous contrast. RADIATION DOSE REDUCTION: This exam was performed according to the departmental dose-optimization program which includes automated exposure control, adjustment of the mA and/or kV according to patient size and/or use of iterative reconstruction technique. CONTRAST:  OMNIPAQUE IOHEXOL 300 MG/ML  SOLN COMPARISON:  09/28/2021 FINDINGS: Lower chest: No acute abnormality Hepatobiliary: No focal hepatic abnormality. Gallbladder unremarkable. Pancreas: No focal abnormality or ductal dilatation. Spleen: 2 cm  low-density lesion in the spleen, stable since prior study, likely cyst. Normal size. Adrenals/Urinary Tract: No focal hepatic abnormality. Gallbladder unremarkable. Stomach/Bowel: Normal appendix. Prior gastric bypass. There is mild wall thickening within the jejunal loop of small bowel extending from the gastrojejunostomy. This may reflect enteritis. No evidence of bowel obstruction. Normal appendix. Large bowel unremarkable. Vascular/Lymphatic: No evidence of aneurysm or adenopathy. Reproductive: Prior hysterectomy.  No adnexal masses. Other: No free fluid or free air. Musculoskeletal: No acute bony abnormality.  IMPRESSION: Prior gastric bypass. The small bowel loop extending from the stomach anastomosis appears thick walled. This may reflect enteritis. Remainder of the small bowel is unremarkable. No bowel obstruction. Electronically Signed   By: Charlett Nose M.D.   On: 01/17/2023 18:09    EKG: Independently reviewed. Sinus tachycardia, rate 108, PVC.   Assessment/Plan   1. Intractable abdominal pain with N/V  - CT notable for wall-thickening involving jejunal loop extending from gastrojejunal anastomosis  - Continue IVF hydration, pain-control, antiemetics; monitor electrolytes and volume status, trial clears when improved and advance as tolerated     2. Ulcerative colitis  - Large bowel appears unremarkable on CT in ED   - Continue mesalamine   3. Depression, anxiety  - Continue Cymbalta, Wellbutrin, Buspar, Remeron, and as-needed Ativan    4. Chronic migraine  - Continue Atogepant     DVT prophylaxis: SCDs  Code Status: Full  Level of Care: Level of care: Med-Surg Family Communication: none present  Disposition Plan:  Patient is from: Home  Anticipated d/c is to: Home Anticipated d/c date is: Possibly as early as 01/18/23  Patient currently: Pending pain-control, tolerance of adequate oral intake  Consults called: None  Admission status: Observation     Briscoe Deutscher,  MD [facility]  01/17/2023, 10:28 PM

## 2023-01-18 ENCOUNTER — Observation Stay (HOSPITAL_COMMUNITY): Payer: 59

## 2023-01-18 DIAGNOSIS — K5190 Ulcerative colitis, unspecified, without complications: Secondary | ICD-10-CM | POA: Diagnosis not present

## 2023-01-18 DIAGNOSIS — R195 Other fecal abnormalities: Secondary | ICD-10-CM | POA: Diagnosis present

## 2023-01-18 DIAGNOSIS — Z8249 Family history of ischemic heart disease and other diseases of the circulatory system: Secondary | ICD-10-CM | POA: Diagnosis not present

## 2023-01-18 DIAGNOSIS — R112 Nausea with vomiting, unspecified: Secondary | ICD-10-CM | POA: Insufficient documentation

## 2023-01-18 DIAGNOSIS — Z9884 Bariatric surgery status: Secondary | ICD-10-CM | POA: Diagnosis not present

## 2023-01-18 DIAGNOSIS — F332 Major depressive disorder, recurrent severe without psychotic features: Secondary | ICD-10-CM | POA: Diagnosis present

## 2023-01-18 DIAGNOSIS — R1010 Upper abdominal pain, unspecified: Secondary | ICD-10-CM

## 2023-01-18 DIAGNOSIS — Z96653 Presence of artificial knee joint, bilateral: Secondary | ICD-10-CM | POA: Diagnosis present

## 2023-01-18 DIAGNOSIS — K254 Chronic or unspecified gastric ulcer with hemorrhage: Secondary | ICD-10-CM | POA: Diagnosis present

## 2023-01-18 DIAGNOSIS — Z1152 Encounter for screening for COVID-19: Secondary | ICD-10-CM | POA: Diagnosis not present

## 2023-01-18 DIAGNOSIS — E440 Moderate protein-calorie malnutrition: Secondary | ICD-10-CM | POA: Diagnosis present

## 2023-01-18 DIAGNOSIS — Z79899 Other long term (current) drug therapy: Secondary | ICD-10-CM | POA: Diagnosis not present

## 2023-01-18 DIAGNOSIS — G43909 Migraine, unspecified, not intractable, without status migrainosus: Secondary | ICD-10-CM | POA: Diagnosis present

## 2023-01-18 DIAGNOSIS — E876 Hypokalemia: Secondary | ICD-10-CM | POA: Diagnosis not present

## 2023-01-18 DIAGNOSIS — D500 Iron deficiency anemia secondary to blood loss (chronic): Secondary | ICD-10-CM | POA: Diagnosis not present

## 2023-01-18 DIAGNOSIS — D509 Iron deficiency anemia, unspecified: Secondary | ICD-10-CM | POA: Diagnosis present

## 2023-01-18 DIAGNOSIS — K284 Chronic or unspecified gastrojejunal ulcer with hemorrhage: Secondary | ICD-10-CM | POA: Diagnosis present

## 2023-01-18 DIAGNOSIS — K219 Gastro-esophageal reflux disease without esophagitis: Secondary | ICD-10-CM | POA: Diagnosis present

## 2023-01-18 DIAGNOSIS — D62 Acute posthemorrhagic anemia: Secondary | ICD-10-CM | POA: Diagnosis present

## 2023-01-18 DIAGNOSIS — K229 Disease of esophagus, unspecified: Secondary | ICD-10-CM | POA: Diagnosis present

## 2023-01-18 DIAGNOSIS — F419 Anxiety disorder, unspecified: Secondary | ICD-10-CM | POA: Diagnosis present

## 2023-01-18 DIAGNOSIS — K922 Gastrointestinal hemorrhage, unspecified: Secondary | ICD-10-CM | POA: Diagnosis not present

## 2023-01-18 DIAGNOSIS — Z87891 Personal history of nicotine dependence: Secondary | ICD-10-CM | POA: Diagnosis not present

## 2023-01-18 DIAGNOSIS — R110 Nausea: Secondary | ICD-10-CM

## 2023-01-18 DIAGNOSIS — E538 Deficiency of other specified B group vitamins: Secondary | ICD-10-CM | POA: Diagnosis not present

## 2023-01-18 DIAGNOSIS — J45909 Unspecified asthma, uncomplicated: Secondary | ICD-10-CM | POA: Diagnosis present

## 2023-01-18 DIAGNOSIS — R1115 Cyclical vomiting syndrome unrelated to migraine: Secondary | ICD-10-CM | POA: Diagnosis present

## 2023-01-18 DIAGNOSIS — Z6828 Body mass index (BMI) 28.0-28.9, adult: Secondary | ICD-10-CM | POA: Diagnosis not present

## 2023-01-18 DIAGNOSIS — K289 Gastrojejunal ulcer, unspecified as acute or chronic, without hemorrhage or perforation: Secondary | ICD-10-CM | POA: Diagnosis not present

## 2023-01-18 DIAGNOSIS — E860 Dehydration: Secondary | ICD-10-CM | POA: Diagnosis present

## 2023-01-18 DIAGNOSIS — Z7985 Long-term (current) use of injectable non-insulin antidiabetic drugs: Secondary | ICD-10-CM | POA: Diagnosis not present

## 2023-01-18 DIAGNOSIS — R109 Unspecified abdominal pain: Secondary | ICD-10-CM | POA: Diagnosis not present

## 2023-01-18 DIAGNOSIS — D649 Anemia, unspecified: Secondary | ICD-10-CM | POA: Diagnosis not present

## 2023-01-18 LAB — CBC
HCT: 27.3 % — ABNORMAL LOW (ref 36.0–46.0)
Hemoglobin: 9 g/dL — ABNORMAL LOW (ref 12.0–15.0)
MCH: 31.8 pg (ref 26.0–34.0)
MCHC: 33 g/dL (ref 30.0–36.0)
MCV: 96.5 fL (ref 80.0–100.0)
Platelets: 256 10*3/uL (ref 150–400)
RBC: 2.83 MIL/uL — ABNORMAL LOW (ref 3.87–5.11)
RDW: 13.8 % (ref 11.5–15.5)
WBC: 12.2 10*3/uL — ABNORMAL HIGH (ref 4.0–10.5)
nRBC: 0 % (ref 0.0–0.2)

## 2023-01-18 LAB — BASIC METABOLIC PANEL
Anion gap: 8 (ref 5–15)
BUN: 20 mg/dL (ref 6–20)
CO2: 27 mmol/L (ref 22–32)
Calcium: 8.8 mg/dL — ABNORMAL LOW (ref 8.9–10.3)
Chloride: 103 mmol/L (ref 98–111)
Creatinine, Ser: 0.74 mg/dL (ref 0.44–1.00)
GFR, Estimated: >60 mL/min (ref >60)
Glucose, Bld: 111 mg/dL — ABNORMAL HIGH (ref 70–99)
Potassium: 3.4 mmol/L — ABNORMAL LOW (ref 3.5–5.1)
Sodium: 138 mmol/L (ref 135–145)

## 2023-01-18 LAB — VITAMIN D 25 HYDROXY (VIT D DEFICIENCY, FRACTURES): Vit D, 25-Hydroxy: 56.83 ng/mL (ref 30–100)

## 2023-01-18 LAB — HIV ANTIBODY (ROUTINE TESTING W REFLEX): HIV Screen 4th Generation wRfx: NONREACTIVE

## 2023-01-18 LAB — VITAMIN B12: Vitamin B-12: 195 pg/mL (ref 180–914)

## 2023-01-18 LAB — FOLATE: Folate: 6.3 ng/mL (ref >5.9)

## 2023-01-18 LAB — MAGNESIUM: Magnesium: 2 mg/dL (ref 1.7–2.4)

## 2023-01-18 MED ORDER — POTASSIUM CHLORIDE CRYS ER 20 MEQ PO TBCR
60.0000 meq | EXTENDED_RELEASE_TABLET | Freq: Once | ORAL | Status: AC
  Administered 2023-01-18: 60 meq via ORAL
  Filled 2023-01-18: qty 3

## 2023-01-18 MED ORDER — BUPROPION HCL ER (XL) 300 MG PO TB24
450.0000 mg | ORAL_TABLET | Freq: Every day | ORAL | Status: DC
  Administered 2023-01-19 – 2023-01-21 (×3): 450 mg via ORAL
  Filled 2023-01-18 (×3): qty 1

## 2023-01-18 MED ORDER — HYDROMORPHONE HCL 2 MG PO TABS
4.0000 mg | ORAL_TABLET | ORAL | Status: DC | PRN
  Administered 2023-01-18 – 2023-01-21 (×12): 4 mg via ORAL
  Filled 2023-01-18 (×12): qty 2

## 2023-01-18 MED ORDER — LACTATED RINGERS IV SOLN: INTRAVENOUS | Status: DC

## 2023-01-18 MED ORDER — HYDROMORPHONE HCL 1 MG/ML IJ SOLN
0.5000 mg | INTRAMUSCULAR | Status: DC | PRN
  Administered 2023-01-18 – 2023-01-21 (×5): 1 mg via INTRAVENOUS
  Filled 2023-01-18 (×5): qty 1

## 2023-01-18 NOTE — Hospital Course (Signed)
47 y.o. female with medical history significant for depression, anxiety, chronic migraine, ulcerative colitis, and history of gastric bypass who presents with upper abdominal pain, nausea, and vomiting.   Patient reports developing upper abdominal pain with nausea and vomiting on 01/12/2023.  Since then, pain has worsened and become more generalized.  She denies any fever, chills, or diarrhea associated with this.  She has been unable to tolerate even liquids at home unable to control her pain, and came to the ED for evaluation.   ED Course: Upon arrival to the ED, patient is found to be afebrile and saturating well on room air with mild tachycardia and stable blood pressure.  EKG reveals sinus tachycardia with PVC.  CT of the abdomen and pelvis is notable for prior gastric bypass and thick-walled appearance to a small bowel loop extending from the stomach anastomosis.  Labs are notable for normal LFTs, normal lipase, and WBC 15,400.   Patient was treated with IV fluids, 2 doses of fentanyl, Dilaudid, and Zofran in the ED.  4/24: Hemodynamically stable.  Slowly drifting down hemoglobin at 6.7 today.  1 unit of PRBC ordered.  EGD today with jejunal erosions and ulcers involving anastomosis.  Biopsies were taken.  GI is recommending continuation of twice daily PPI and sucralfate and they will follow-up as outpatient.  4/25: Hemoglobin seems stable at 8.3 this morning.  Her Carafate dose was increased to every 6 hourly by GI and he will continue taking twice daily Protonix indefinitely.  GI will follow-up on the biopsy results and for further management as outpatient.  Patient was instructed to avoid constipation and continue taking her home medications as she was taking it before.  She should also avoid NSAID.  Patient need to follow-up with her providers closely for further recommendations.

## 2023-01-18 NOTE — Plan of Care (Signed)
  Problem: Clinical Measurements: Goal: Ability to maintain clinical measurements within normal limits will improve Outcome: Progressing   Problem: Coping: Goal: Level of anxiety will decrease Outcome: Progressing   Problem: Pain Managment: Goal: General experience of comfort will improve Outcome: Progressing   Problem: Safety: Goal: Ability to remain free from injury will improve Outcome: Progressing   

## 2023-01-18 NOTE — Progress Notes (Signed)
Progress Note   Patient: [name] ZOX:[id number] DOB: [birth year] DOA: 01/17/2023     0 DOS: the patient was seen and examined on 01/18/2023   Brief hospital course: 47 y.o. female with medical history significant for depression, anxiety, chronic migraine, ulcerative colitis, and history of gastric bypass who presents with upper abdominal pain, nausea, and vomiting.   Patient reports developing upper abdominal pain with nausea and vomiting on 01/12/2023.  Since then, pain has worsened and become more generalized.  She denies any fever, chills, or diarrhea associated with this.  She has been unable to tolerate even liquids at home unable to control her pain, and came to the ED for evaluation.   ED Course: Upon arrival to the ED, patient is found to be afebrile and saturating well on room air with mild tachycardia and stable blood pressure.  EKG reveals sinus tachycardia with PVC.  CT of the abdomen and pelvis is notable for prior gastric bypass and thick-walled appearance to a small bowel loop extending from the stomach anastomosis.  Labs are notable for normal LFTs, normal lipase, and WBC 15,400.   Patient was treated with IV fluids, 2 doses of fentanyl, Dilaudid, and Zofran in the ED.  Assessment and Plan: 1. Intractable abdominal pain with N/V  - CT notable for wall-thickening involving jejunal loop extending from gastrojejunal anastomosis  -Pt reports symptoms worse with cold liquids however is tolerating warm liquids and is asking to advance diet further -Advance diet to full today - Continue IVF hydration, pain-control, antiemetics; monitor electrolytes and volume status, trial clears when improved and advance as tolerated      2. Ulcerative colitis  - Large bowel appears unremarkable on CT in ED   - Continue mesalamine    3. Depression, anxiety  - Continue Cymbalta, Wellbutrin, Buspar, Remeron, and as-needed Ativan     4. Chronic migraine  - Continue Atogepant     5.  Weakness and fall -Pt reportedly became markedly weak at home, causing her to fall and wedge herself next to some furniture -Pt did recall hitting her head. Ordered and reviewed head CT. Negative -PT consulted -Will order thiamine, b12, folate levels  6. Dehydration -Mucus membranes are very dry in the setting of poor po intake -will continue pt on LR at 100cc/hr -recheck bmet in AM -consulted dietitian  7. Hypokalemia -replaced  Subjective: Still complaining of some abd discomfort  Physical Exam: Vitals:   01/17/23 2230 01/17/23 2313 01/18/23 0333 01/18/23 0500  BP: 120/70 (!) 130/94 120/85   Pulse: 83 96 89   Resp: Temp:  98.2 F (36.8 C) 98.3 F (36.8 C)   TempSrc:  Oral    SpO2: 100% 100% 100%   Weight:  69.8 kg  73.6 kg  Height:   (1.702 m)     General exam: Awake, laying in bed, in nad Respiratory system: Normal respiratory effort, no wheezing Cardiovascular system: regular rate, s1, s2 Gastrointestinal system: Soft, nondistended, positive BS Central nervous system: CN2-12 grossly intact, strength intact Extremities: Perfused, no clubbing Skin: Normal skin turgor, no notable skin lesions seen Psychiatry: Mood normal // no visual hallucinations   Data Reviewed:  Labs reviewed: Na 138, K 3.4, Cr 0.74, WBC 12.2, Hgb 9.0   Family Communication: Pt in room, family not at bedside  Disposition: Status is: Observation The patient will require care spanning > 2 midnights and should be moved to inpatient because: Severity of illness  Planned Discharge Destination:  Home    Author: Rickey Barbara, MD 01/18/2023 6:53 PM  For on call review [email or web address].

## 2023-01-19 ENCOUNTER — Inpatient Hospital Stay (HOSPITAL_COMMUNITY): Payer: 59

## 2023-01-19 DIAGNOSIS — R110 Nausea: Secondary | ICD-10-CM | POA: Diagnosis not present

## 2023-01-19 DIAGNOSIS — R1010 Upper abdominal pain, unspecified: Secondary | ICD-10-CM | POA: Diagnosis not present

## 2023-01-19 DIAGNOSIS — R109 Unspecified abdominal pain: Secondary | ICD-10-CM | POA: Diagnosis not present

## 2023-01-19 LAB — BASIC METABOLIC PANEL
Anion gap: 6 (ref 5–15)
BUN: 9 mg/dL (ref 6–20)
CO2: 27 mmol/L (ref 22–32)
Calcium: 8.3 mg/dL — ABNORMAL LOW (ref 8.9–10.3)
Chloride: 104 mmol/L (ref 98–111)
Creatinine, Ser: 0.68 mg/dL (ref 0.44–1.00)
GFR, Estimated: >60 mL/min (ref >60)
Glucose, Bld: 86 mg/dL (ref 70–99)
Potassium: 3.8 mmol/L (ref 3.5–5.1)
Sodium: 137 mmol/L (ref 135–145)

## 2023-01-19 LAB — CBC
HCT: 24.7 % — ABNORMAL LOW (ref 36.0–46.0)
Hemoglobin: 7.8 g/dL — ABNORMAL LOW (ref 12.0–15.0)
MCH: 31.1 pg (ref 26.0–34.0)
MCHC: 31.6 g/dL (ref 30.0–36.0)
MCV: 98.4 fL (ref 80.0–100.0)
Platelets: 243 10*3/uL (ref 150–400)
RBC: 2.51 MIL/uL — ABNORMAL LOW (ref 3.87–5.11)
RDW: 14.3 % (ref 11.5–15.5)
WBC: 7.8 10*3/uL (ref 4.0–10.5)
nRBC: 0 % (ref 0.0–0.2)

## 2023-01-19 MED ORDER — POTASSIUM CHLORIDE CRYS ER 20 MEQ PO TBCR
40.0000 meq | EXTENDED_RELEASE_TABLET | Freq: Once | ORAL | Status: AC
  Administered 2023-01-19: 40 meq via ORAL
  Filled 2023-01-19: qty 2

## 2023-01-19 MED ORDER — VITAMIN B-12 1000 MCG PO TABS
1000.0000 ug | ORAL_TABLET | Freq: Every day | ORAL | Status: DC
  Administered 2023-01-19 – 2023-01-20 (×2): 1000 ug via ORAL
  Filled 2023-01-19 (×2): qty 1

## 2023-01-19 MED ORDER — ENSURE MAX PROTEIN PO LIQD
11.0000 [oz_av] | Freq: Two times a day (BID) | ORAL | Status: DC
  Administered 2023-01-19 – 2023-01-21 (×2): 11 [oz_av] via ORAL

## 2023-01-19 NOTE — Plan of Care (Signed)
  Problem: Clinical Measurements: Goal: Ability to maintain clinical measurements within normal limits will improve Outcome: Progressing   Problem: Clinical Measurements: Goal: Will remain free from infection Outcome: Progressing   Problem: Activity: Goal: Risk for activity intolerance will decrease Outcome: Progressing   Problem: Nutrition: Goal: Adequate nutrition will be maintained Outcome: Progressing   Problem: Coping: Goal: Level of anxiety will decrease Outcome: Progressing   Problem: Pain Managment: Goal: General experience of comfort will improve Outcome: Progressing

## 2023-01-19 NOTE — Evaluation (Signed)
Physical Therapy Evaluation Patient Details Name: [name] MRN: [id number] DOB: July 03, 1976 Today's Date: 01/19/2023  History of Present Illness  Pt is a 47 yo female admitted with intractable abdominal pain. Head CT No acute intracranial process. PMH: depression, anxiety, chronic migraine, ulcerative colitis, and gastric bypass   Clinical Impression  Pt admitted with above diagnosis. Pt from home, reports ind with occasional SPC use, reports falling all the time due to dizziness and nausea and is able to get up ind or calls sons to assist her up as needed. Pt comes to sitting EOB without physical assist, able to power up to stand with RW and supv. Pt takes 2 steps forward with RW and no knee buckling or unsteadiness noted, MD entering room so pt takes 2 steps back to EOB. Pt with pain and audible popping noise when wiggling R leg while seated EOB, RN notified of pt reporting she has fallen on bil knee replacements several times and is having pain now. Pt checking to see if can get RW or rollator back from uncle- if not, may need DME ordered. Pt currently with functional limitations due to the deficits listed below (see PT Problem List). Pt will benefit from acute skilled PT to increase their independence and safety with mobility to allow discharge.          Recommendations for follow up therapy are one component of a multi-disciplinary discharge planning process, led by the attending physician.  Recommendations may be updated based on patient status, additional functional criteria and insurance authorization.  Follow Up Recommendations       Assistance Recommended at Discharge PRN  Patient can return home with the following  A little help with walking and/or transfers;A little help with bathing/dressing/bathroom;Assistance with cooking/housework;Assist for transportation;Help with stairs or ramp for entrance    Equipment Recommendations Other (comment) (TBD)  Recommendations for Other  Services       Functional Status Assessment Patient has had a recent decline in their functional status and demonstrates the ability to make significant improvements in function in a reasonable and predictable amount of time.     Precautions / Restrictions Precautions Precautions: Fall Restrictions Weight Bearing Restrictions: No      Mobility  Bed Mobility Overal bed mobility: Modified Independent   Transfers Overall transfer level: Needs assistance Equipment used: Rolling walker (2 wheels) Transfers: Sit to/from Stand Sit to Stand: Supervision  General transfer comment: verbal cues for hand placement, pt denies dizziness, therapist managing lines    Ambulation/Gait Ambulation/Gait assistance: Supervision  Assistive device: Rolling walker (2 wheels) Gait Pattern/deviations: Step-to pattern, Decreased stride length Gait velocity: decreased  General Gait Details: pt takes 2 steps forward then back to EOB with MD enterring room, no audible popping noise noted in weight-bearing, no knee buckling, denies dizzines with initial standing and limited steps at bedside  Stairs            Wheelchair Mobility    Modified Rankin (Stroke Patients Only)       Balance Overall balance assessment: Mild deficits observed, not formally tested, History of Falls       Pertinent Vitals/Pain Pain Assessment Pain Assessment: Faces Faces Pain Scale: Hurts a little bit Pain Location: bil knees Pain Descriptors / Indicators: Aching, Sore ("do you hear that noise?") Pain Intervention(s): Limited activity within patient's tolerance, Monitored during session    Home Living Family/patient expects to be discharged to:: Private residence Living Arrangements: Children Available Help at Discharge: Family Type of Home: Apartment  Home Access: Stairs to enter Entrance Stairs-Rails: None Entrance Stairs-Number of Steps: 4 to get into building, 1 at door   Home Layout: One level Home  Equipment: Gilmer Mor - single point Additional Comments: rollator given to uncle    Prior Function Prior Level of Function : Independent/Modified Independent;History of Falls (last six months)  Mobility Comments: pt reports ind, uses SPC as needed, reports "I fall all the time" and is able to get up ind or sons assist her ADLs Comments: pt reports ind with self care, sons assist with household chores     Hand Dominance   Dominant Hand: Right    Extremity/Trunk Assessment   Upper Extremity Assessment Upper Extremity Assessment: Overall WFL for tasks assessed    Lower Extremity Assessment Lower Extremity Assessment: Generalized weakness (AROM WFL, strength grossly 3+/5, no gross deformities noted but pt reports falling on both knee replacements and having swelling in them, pt also with audible popping noise in R knee when wiggling entire leg while seated EOB)    Cervical / Trunk Assessment Cervical / Trunk Assessment: Normal  Communication   Communication: No difficulties  Cognition Arousal/Alertness: Awake/alert Behavior During Therapy: WFL for tasks assessed/performed Overall Cognitive Status: Within Functional Limits for tasks assessed     General Comments      Exercises     Assessment/Plan    PT Assessment Patient needs continued PT services  PT Problem List Decreased strength;Decreased activity tolerance;Decreased balance;Decreased knowledge of use of DME;Decreased safety awareness;Pain;Impaired sensation       PT Treatment Interventions DME instruction;Gait training;Stair training;Functional mobility training;Therapeutic activities;Therapeutic exercise;Balance training;Patient/family education    PT Goals (Current goals can be found in the Care Plan section)  Acute Rehab PT Goals Patient Stated Goal: stop falling PT Goal Formulation: With patient Time For Goal Achievement: 02/02/23 Potential to Achieve Goals: Good    Frequency Min 1X/week     Co-evaluation                AM-PAC PT "6 Clicks" Mobility  Outcome Measure Help needed turning from your back to your side while in a flat bed without using bedrails?: A Little Help needed moving from lying on your back to sitting on the side of a flat bed without using bedrails?: A Little Help needed moving to and from a bed to a chair (including a wheelchair)?: A Little Help needed standing up from a chair using your arms (e.g., wheelchair or bedside chair)?: A Little Help needed to walk in hospital room?: A Little Help needed climbing 3-5 steps with a railing? : A Lot 6 Click Score: 17    End of Session   Activity Tolerance: Patient tolerated treatment well Patient left: in bed;with call bell/phone within reach (seated EOB with MD in room) Nurse Communication: Mobility status;Other (comment) (bil knee pain, falling on knee replacements, audible popping noise in R knee) PT Visit Diagnosis: Other abnormalities of gait and mobility (R26.89);History of falling (Z91.81);Pain Pain - Right/Left:  (bil) Pain - part of body: Knee    Time: 1610-9604 PT Time Calculation (min) (ACUTE ONLY): 9 min   Charges:   PT Evaluation $PT Eval Low Complexity: 1 Low           Tori Windle Huebert PT, DPT 01/19/23, 11:31 AM

## 2023-01-19 NOTE — Progress Notes (Signed)
Progress Note   Patient: [name] WGN:[id number] DOB: [birth year] DOA: 01/17/2023     1 DOS: the patient was seen and examined on 01/19/2023   Brief hospital course: 47 y.o. female with medical history significant for depression, anxiety, chronic migraine, ulcerative colitis, and history of gastric bypass who presents with upper abdominal pain, nausea, and vomiting.   Patient reports developing upper abdominal pain with nausea and vomiting on 01/12/2023.  Since then, pain has worsened and become more generalized.  She denies any fever, chills, or diarrhea associated with this.  She has been unable to tolerate even liquids at home unable to control her pain, and came to the ED for evaluation.   ED Course: Upon arrival to the ED, patient is found to be afebrile and saturating well on room air with mild tachycardia and stable blood pressure.  EKG reveals sinus tachycardia with PVC.  CT of the abdomen and pelvis is notable for prior gastric bypass and thick-walled appearance to a small bowel loop extending from the stomach anastomosis.  Labs are notable for normal LFTs, normal lipase, and WBC 15,400.   Patient was treated with IV fluids, 2 doses of fentanyl, Dilaudid, and Zofran in the ED.  Assessment and Plan: 1. Intractable abdominal pain with N/V  - CT notable for wall-thickening involving jejunal loop extending from gastrojejunal anastomosis  -Pt reports symptoms worse with cold liquids however is tolerating warm liquids and is asking to advance diet further -Diet was advanced to fulls per pt request, however she continues with intolerance to PO and continues to have n/v - Continue IVF hydration, pain-control, antiemetics -have consulted GI. Appreciate recs. Recommendation to continue pantoprazole   and carafate 1g bid -Pt is now pending diagnostic EGD on 4/24   2. Ulcerative colitis  - Large bowel appears unremarkable on CT in ED   - Continue mesalamine    3. Depression, anxiety   - Continue Cymbalta, Wellbutrin, Buspar, Remeron, and as-needed Ativan     4. Chronic migraine  - Continue Atogepant     5. Weakness and fall -Pt reportedly became markedly weak at home, causing her to fall and wedge herself next to some furniture -Pt did recall hitting her head. Ordered and reviewed head CT. Negative -PT consulted with recs for HHPT -B12 borderline low at 195. Methylmalonate level pending. Have started B12 supplement daily -Vit A, E, D, K, zinc levels pending  6. Dehydration with moderate protein calorie malnutrition present on admit -Mucus membranes remain dry in the setting of poor po intake -will continue pt on LR at 100cc/hr -dietitian consulted  7. Hypokalemia -replaced -recheck bmet in AM  Subjective: Continues with lower abd discomfort and intolerance to PO liquids. Reports n/v today with trial of soup  Physical Exam: Vitals:   01/18/23 2013 01/19/23 0500 01/19/23 0603 01/19/23 1210  BP: (!) 107/56  (!) 101/56 108/68  Pulse: 96  83 84  Resp: Temp: 98.4 F (36.9 C)  98 F (36.7 C)   TempSrc: Oral  Oral   SpO2: 100%  100% 100%  Weight:  73.3 kg    Height:       General exam: Conversant, in no acute distress Respiratory system: normal chest rise, clear, no audible wheezing Cardiovascular system: regular rhythm, s1-s2 Gastrointestinal system: Nondistended, nontender, pos BS Central nervous system: No seizures, no tremors Extremities: No cyanosis, no joint deformities Skin: No rashes, no pallor Psychiatry: Affect normal // no auditory hallucinations   Data  Reviewed:  Labs reviewed: Na 137, K 3.8, Cr 0.68, Hgb 7.8   Family Communication: Pt in room, family not at bedside  Disposition: Status is: Inpatient Continue inpatient because: Severity of illness  Planned Discharge Destination: Home    Author: Rickey Barbara, MD 01/19/2023 5:44 PM  For on call review [email or web address].

## 2023-01-19 NOTE — Progress Notes (Signed)
Initial Nutrition Assessment  DOCUMENTATION CODES:   Non-severe (moderate) malnutrition in context of chronic illness  INTERVENTION: - Full Liquid diet, advance as medically appropriate. - Ensure Max po BID, each supplement provides 150 kcal and 30 grams of protein.   - Checking several vitamin/minerals for deficiencies due to history of gastric bypass, Ulcerative Colitis, and limited oral intake for several months.  - Vitamin A, E, K, B6, Zinc, Thiamine - pending  - Vitamin B12, folate, Vitamin D WNL.  - Monitor weight trends.    NUTRITION DIAGNOSIS:   Moderate Malnutrition related to chronic illness (Ulcerative Colitis; history of gastric bypass) as evidenced by mild fat depletion, mild muscle depletion, percent weight loss (28% in 1 year).  GOAL:  Patient will meet greater than or equal to 90% of their needs  MONITOR:   PO intake, Supplement acceptance, Diet advancement, Weight trends, Labs  REASON FOR ASSESSMENT:   Consult Assessment of nutrition requirement/status  ASSESSMENT:   47 y.o. female with medical history significant for depression, anxiety, ulcerative colitis, and history of gastric bypass who presented with 5 day history of upper abdominal pain, nausea, and vomiting.  Patient reports she is unsure of an exact UBW but has been losing weight over the past year.  Patient has a history of gastric bypass (2017) after which she lost a lot of weight and a feeding tube placed. After her feeding tube was removed she started gaining weight back. She reports being around 240#in the middle of 2023 and has since been losing weight drastically.   Per EMR, patient weighed at 215# at office visit 1 year ago and now weighed at 154# - a 61# or 28% weight loss in 1 year, which is significant for the time frame.   Patient states her diet is very limited at home. She can't really eat solid food or she will throw it up. Has been consuming mostly iced coffee, water, and 2 [employer]  protein drinks a day for months.   She has been trying to consume full liquids since admit but admits to being frustrated by limited options. Feels if she had a  diet upgrade she could still choose foods she knows she could tolerate.  However, GI planning for EGD in AM so plan to keep patient on full liquids at this time.   Patient agreeable to receive nutrition supplements during admission. Fears regular Ensure will cause dumping so would like to try Ensure Max as she has had Premier Protein and done well with those before.  Given patient's history of gastric bypass, Ulcerative Colitis, and limited oral intake for several months, suspect she may have vitamin and mineral deficiencies. Discussed ordering labs with MD, MD on board. MD already checked vitamin B12, vitamin D, and folate which were WNL.    Medications reviewed and include: vitamin B12, Carafate  Labs reviewed:  Thiamine (pending) Folate 6.3 (WNL) Vitamin D 56.83 (WNL_ Vitamin B12 195 (WNL)   NUTRITION - FOCUSED PHYSICAL EXAM:  Flowsheet Row Most Recent Value  Orbital Region Mild depletion  Upper Arm Region Mild depletion  Thoracic and Lumbar Region No depletion  Buccal Region No depletion  Temple Region Mild depletion  Clavicle Bone Region Mild depletion  Clavicle and Acromion Bone Region Mild depletion  Scapular Bone Region Unable to assess  Dorsal Hand No depletion  Patellar Region No depletion  Anterior Thigh Region No depletion  Posterior Calf Region No depletion  Edema (RD Assessment) None  Hair Reviewed  Eyes Reviewed  Mouth Reviewed  Skin Reviewed  Nails Reviewed       Diet Order:   Diet Order             Diet NPO time specified  Diet effective midnight           Diet full liquid Room service appropriate? Yes; Fluid consistency: Thin  Diet effective now                   EDUCATION NEEDS:  Education needs have been addressed  Skin:  Skin Assessment: Reviewed RN Assessment  Last  BM:  4/16  Height:  Ht Readings from Last 1 Encounters:  01/17/23  (1.702 m)   Weight:  Wt used for calculations:  01/17/23 69.8 kg    BMI:  Body mass index is 24.1 kg/m.  Estimated Nutritional Needs:  Kcal:  1950-2100 kcals Protein:  85-105 grams Fluid:  >/= 1.9L    Shelle Iron RD, LDN For contact information, refer to [facility].

## 2023-01-19 NOTE — Consult Note (Signed)
[facility] Gastroenterology Consult  Referring Provider: [facility] hospitalist/Dr. Rickey Barbara Primary Care Physician:  Felix Pacini, FNP Primary Gastroenterologist: Gentry Fitz, sees GI at [facility]  Reason for Consultation: Nausea, vomiting, abnormal CAT scan  HPI: [name] is a 47 y.o. female with history of ulcerative colitis, diagnosed in 2015, on Lialda 1.2 g twice a day, history of bariatric surgery with revision and gastric bypass in 2021, was admitted on 01/17/2023 with nausea, vomiting and abdominal pain. Patient reports intermittent episodes of cyclical nausea, vomiting and abdominal pain, denies use of marijuana.  Since her bypass she is focusing more on liquids as a primary source of oral intake, primarily protein shakes, however for the last several months she has intractable nausea and vomiting and has not been able to keep even liquids down.  Patient states she is usually constipated and recently has been doing well with Linzess 290 mcg along with Symproic 0.2 tablets daily.  With this medication she has a bowel movement every day, intermittently notices blood.  She states her last colonoscopy was at [facility] a year ago. For the last 3 days patient has also noticed dark sometimes black stools.  She denies use of NSAIDs or Pepto-Bismol.  Patient has had significant weight loss since her gastric bypass, was about 240 pounds, at one point postprocedure was unable to tolerate oral intake and needed a feeding tube which has been subsequently removed, currently weighs between 150 to 160 pounds.  She struggles with acid reflux and heartburn for which she takes over-the-counter PPIs.  She has not had any difficulty or pain on swallowing.   Past Medical History:  Diagnosis Date   Anal fissure - posterior 10/16/2014    OCC  ISSUES   Anxiety    doesn't take anything   Asthma    has Albuterol inhaler as needed   Boil    on pubic area started septra  03-01-17 draining blood and pus   Bronchitis    Chronic headache disorder 07/22/2016   Clostridium difficile infection 04/20/2012   Colitis    Dehydration    Depression    Family history of adverse reaction to anesthesia    pt mom gets sick   GERD (gastroesophageal reflux disease)    takes Pantoprazole daily   Headache    Hiatal hernia    neuropathy - mild in arms and legs   History of blood transfusion    last transfusion was 04/04/2016=Benadryl was given d/t itching. States she always itches with transfusion.    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   Marginal ulcer 10/25/2017   Migraine    Motion sickness    Nausea    takes Zofran as needed   Nausea and vomiting    for 1 year   Obesity    Oligouria    Osteoarthritis    lower back, knees, wrists - no meds   Peripheral neuropathy 03/01/2019   Pneumonia 1997   Postoperative nausea and vomiting 01/21/2016   wants scopolamine patch   Right carpal  tunnel syndrome 03/01/2019   SVD (spontaneous vaginal delivery)    x 4   Tingling    BOTH LOWER EXTREMETIES ALL THE TIME DUE TO RAPID WEIGHT LOSS   TOBACCO USER 10/02/2009   Qualifier: Diagnosis of  By: Carolyne Fiscal MD, Clayburn Pert     Transfusion history    last transfusion 6'16    Tremor 08/31/2018   UC (ulcerative colitis)    supposed to be taking Lialda and Bentyl but has been off since gastric sleeve   Vertigo    doesn't take any meds    Past Surgical History:  Procedure Laterality Date   ABDOMINAL HYSTERECTOMY     PARTIAL   COLONOSCOPY  2007   for rectal bleeding; [facility]   COLONOSCOPY N/A 06/11/2014   Procedure: COLONOSCOPY;  Surgeon: Iva Boop, MD;   Location: [facility];  Service: Endoscopy;  Laterality: N/A;   COLONOSCOPY WITH PROPOFOL N/A 03/02/2017   Procedure: COLONOSCOPY WITH PROPOFOL;  Surgeon: Ovidio Kin, MD;  Location: [facility];  Service: General;  Laterality: N/A;   DILATION AND CURETTAGE OF UTERUS     ESOPHAGOGASTRODUODENOSCOPY (EGD) WITH PROPOFOL N/A 04/06/2016   Procedure: ESOPHAGOGASTRODUODENOSCOPY (EGD) WITH PROPOFOL;  Surgeon: Ovidio Kin, MD;  Location: [facility];  Service: General;  Laterality: N/A;   ESOPHAGOGASTRODUODENOSCOPY (EGD) WITH PROPOFOL N/A 03/02/2017   Procedure: ESOPHAGOGASTRODUODENOSCOPY (EGD) WITH PROPOFOL;  Surgeon: Ovidio Kin, MD;  Location: [facility];  Service: General;  Laterality: N/A;   ESOPHAGOGASTRODUODENOSCOPY (EGD) WITH PROPOFOL N/A 05/20/2017   Procedure: ESOPHAGOGASTRODUODENOSCOPY (EGD) WITH PROPOFOL ERAS PATHWAY;  Surgeon: Ovidio Kin, MD;  Location: [facility];  Service: General;  Laterality: N/A;   ESOPHAGOGASTRODUODENOSCOPY (EGD) WITH PROPOFOL N/A 10/07/2017   Procedure: ESOPHAGOGASTRODUODENOSCOPY (EGD) WITH PROPOFOL;  Surgeon: Ovidio Kin, MD;  Location: [facility];  Service: General;  Laterality: N/A;   EXCISION OF SKIN TAG  06/08/2017   Procedure: EXCISION OF VULVAR SKIN TAGS X2;  Surgeon: Reva Bores, MD;  Location: [facility];  Service: Gynecology;;   FOOT SURGERY Bilateral    x 2   GASTRIC ROUX-EN-Y N/A 12/14/2016   Procedure: LAPAROSCOPIC REVISION SLEEVE GASTRECTOMY TO  ROUX-Y-GASTRIC BY-PASS, UPPER ENDO;  Surgeon: Glenna Fellows, MD;  Location: [facility];  Service: General;  Laterality: N/A;   GASTROJEJUNOSTOMY N/A 05/11/2016   Procedure: LAPAROSCOPIC PLACEMENT  OF FEEDING  JEJUNOSTOMY TUBE;  Surgeon: Glenna Fellows, MD;  Location: [facility];  Service: General;  Laterality: N/A;   HEMORRHOIDECTOMY WITH HEMORRHOID BANDING     IR FLUORO GUIDE CV LINE RIGHT  04/06/2017   IR GENERIC HISTORICAL  05/19/2016   IR CM INJ ANY COLONIC TUBE W/FLUORO 05/19/2016 Irish Lack, MD  [facility]   IR PATIENT EVAL TECH 0-60 MINS  11/30/2017   IR REPLC DUODEN/JEJUNO TUBE PERCUT W/FLUORO  03/14/2018   IR REPLC DUODEN/JEJUNO TUBE PERCUT W/FLUORO  03/15/2018   IR US GUIDE VASC ACCESS RIGHT  04/06/2017   j tube removed march 2018     KNEE ARTHROSCOPY Left 09/06/2014   LAPAROSCOPIC GASTRIC SLEEVE RESECTION N/A 01/14/2016   Procedure: LAPAROSCOPIC GASTRIC SLEEVE RESECTION;  Surgeon: Glenna Fellows, MD;  Location: [facility];  Service: General;  Laterality: N/A;   LAPAROSCOPIC REVISION OF GASTROJEJUNOSTOMY N/A 10/25/2017   Procedure: LAPAROSCOPIC REVISION OF GASTROJEJUNOSTOMY AND PARTIAL GASTRECTOMY, WITH PLACEMENT OF FEEDING GASTROSTOMY TUBE;  Surgeon: Glenna Fellows, MD;  Location: [facility];  Service: General;  Laterality: N/A;   LAPAROSCOPIC TUBAL LIGATION  10/16/2011   Procedure: LAPAROSCOPIC TUBAL LIGATION;  Surgeon: Esmeralda Arthur, MD;  Location: [facility];  Service: Gynecology;  Laterality: Bilateral;   NOVASURE ABLATION  09/28/2010   mild persistent vaginal bleeding   right knee arthroscopy     05/07/2016   TOTAL KNEE ARTHROPLASTY Left 03/10/2018   Procedure: LEFT TOTAL KNEE ARTHROPLASTY;  Surgeon: Durene Romans, MD;  Location: [facility];  Service: Orthopedics;  Laterality: Left;  70 mins   TOTAL KNEE ARTHROPLASTY Right 01/23/2020   Procedure: TOTAL KNEE ARTHROPLASTY, BILATERAL TROCHANTERIC INJECTION;  Surgeon: Durene Romans, MD;  Location: [facility];  Service: Orthopedics;  Laterality: Right;  70 mins for Bilateral Troch injection 1 cc Depo and 2 CC Lidocaine   TUBAL LIGATION     VAGINAL HYSTERECTOMY Bilateral 06/08/2017   Procedure: HYSTERECTOMY VAGINAL W/ BILATERAL SALPINGECTOMY;  Surgeon: Reva Bores, MD;  Location: [facility];  Service: Gynecology;  Laterality: Bilateral;   wisdom teeth extracted      Prior to Admission medications   Medication Sig Start Date End Date Taking? Authorizing Provider  albuterol (VENTOLIN HFA) 108 (90 Base) MCG/ACT inhaler Inhale 2 puffs into the lungs  every 6 (six) hours as needed for wheezing or shortness of breath.   Yes [provider]  Atogepant (QULIPTA) 30 MG TABS Take 1 tablet (30 mg total) by mouth daily. 11/02/22  Yes Ocie Doyne, MD  atorvastatin (LIPITOR) 40 MG tablet Take 40 mg by mouth daily. 12/10/22  Yes [provider]  baclofen (LIORESAL) 10 MG tablet TAKE 1 TABLET BY MOUTH EVERYDAY AT BEDTIME Patient taking differently: Take 10 mg by mouth in the morning, at noon, and at bedtime. Take 1 tablet (10 mg) in morning, noon and Take 2 tablets (20 mg) at bedtime 12/18/20  Yes Glean Salvo, NP  buPROPion (WELLBUTRIN XL) 150 MG 24 hr tablet Take 150 mg by mouth every morning. Take along with 300 mg=450 mg   Yes [provider]  buPROPion (WELLBUTRIN XL) 300 MG 24 hr tablet Take 300 mg by mouth every morning. Take along with 150 mg=450 mg 06/05/20  Yes [provider]  busPIRone (BUSPAR) 15 MG tablet Take 15 mg by mouth 3 (three) times daily.   Yes [provider]  celecoxib (CELEBREX) 200 MG capsule Take 1 capsule (200 mg total) by mouth 2 (two) times daily. 01/24/20  Yes Cassandria Anger, PA-C  Cholecalciferol (VITAMIN D3) 1.25 MG (50000 UT) CAPS Take 50,000 Units by mouth every 7 (seven) days.  04/03/19  Yes [provider]  cyanocobalamin (,VITAMIN B-12,) 1000 MCG/ML injection Inject 1,000 mcg into the muscle 2 (two) times a week.  04/06/19  Yes [provider]  diclofenac Sodium (VOLTAREN) 1 % GEL Apply topically 4 (four) times daily.   Yes [provider]  DULoxetine (CYMBALTA) 60 MG capsule Take 1 capsule (60 mg total) by mouth 2 (two) times daily. 04/10/19  Yes Aldean Baker, NP  EPINEPHrine 0.3 mg/0.3 mL IJ SOAJ injection Inject 0.3 mg into the muscle as needed for anaphylaxis. 04/12/21  Yes Haskel Schroeder, PA-C  ferrous sulfate 325 (65 FE) MG tablet Take 1 tablet (325 mg total) by mouth 2 (two) times daily with a meal. Patient taking differently: Take 325 mg  by mouth daily with breakfast. 01/24/20  Yes Cassandria Anger, PA-C  fluticasone (FLONASE) 50 MCG/ACT nasal spray Place 2 sprays into both nostrils daily as needed for allergies or rhinitis.   Yes [provider]  gabapentin (NEURONTIN) 400 MG capsule Take 400 mg by mouth 3 (three) times daily.  Yes [provider]  HYDROcodone-acetaminophen (NORCO/VICODIN) 5-325 MG tablet Take 1 tablet by mouth every 6 (six) hours as needed for moderate pain.   Yes [provider]  hydrocortisone (ANUSOL-HC) 2.5 % rectal cream Place rectally. 07/29/18  Yes [provider]  HYDROmorphone (DILAUDID) 4 MG tablet Take 1 tablet (4 mg total) by mouth every 3 (three) hours. 01/07/23  Yes   linaclotide (LINZESS) 290 MCG CAPS capsule Take 290 mcg by mouth daily before breakfast.   Yes [provider]  LORazepam (ATIVAN) 1 MG tablet Take 1 mg by mouth 2 (two) times daily as needed for anxiety.   Yes [provider]  magnesium oxide (MAG-OX) 400 (240 Mg) MG tablet Take 1 tablet by mouth daily. 12/19/22  Yes [provider]  meclizine (ANTIVERT) 25 MG tablet Take 25 mg by mouth 3 (three) times daily. 04/15/22  Yes [provider]  mesalamine (LIALDA) 1.2 g EC tablet Take 2.4 g by mouth daily with breakfast.   Yes [provider]  mirtazapine (REMERON) 15 MG tablet Take 15 mg by mouth at bedtime as needed (sleep).   Yes [provider]  Multiple Vitamin (MULTIVITAMIN WITH MINERALS) TABS tablet Take 1 tablet by mouth daily.   Yes [provider]  NARCAN 4 MG/0.1ML LIQD nasal spray kit Place 1 spray into the nose as directed. 03/18/20  Yes [provider]  ondansetron (ZOFRAN) 8 MG tablet Take 8 mg by mouth every 8 (eight) hours as needed for nausea or vomiting.   Yes [provider]  oxybutynin (DITROPAN) 5 MG tablet Take 5 mg by mouth daily.   Yes [provider]  OZEMPIC, 2 MG/DOSE, 8 MG/3ML SOPN Inject 2 mg  into the skin once a week.   Yes [provider]  pantoprazole (PROTONIX) 40 MG tablet TAKE 1 TABLET BY MOUTH TWICE A DAY BEFORE A MEAL 02/22/20  Yes Iva Boop, MD  phentermine (ADIPEX-P) 37.5 MG tablet Take 37.5 mg by mouth daily before breakfast.   Yes [provider]  sucralfate (CARAFATE) 1 g tablet Take 1 g by mouth 2 (two) times daily. 06/12/20  Yes [provider]  SUMAtriptan (IMITREX) 50 MG tablet Take 1 tablet (50 mg total) by mouth every 2 (two) hours as needed for migraine. May repeat in 2 hours if headache persists or recurs. 11/02/22  Yes Ocie Doyne, MD  SYMPROIC 0.2 MG TABS Take 1 tablet by mouth daily. 05/13/21  Yes [provider]  thiamine (VITAMIN B-1) 100 MG tablet Take 100 mg by mouth daily at 12 noon.    Yes [provider]  torsemide (DEMADEX) 10 MG tablet Take 10 mg by mouth daily. 11/10/21  Yes [provider]  botulinum toxin Type A (BOTOX) 200 units injection INJECT 155 UNITS INTRAMUSCULARLY INTO HEAD AND NECK MUSCLES EVERY 3 MONTHS 11/23/22   Levert Feinstein, MD  botulinum toxin Type A (BOTOX) 200 units injection Inject 200 Units into the muscle every 3 (three) months. Inject into head, neck and shoulders every three months Patient not taking: Reported on 01/17/2023 12/08/22   Glean Salvo, NP  dicyclomine (BENTYL) 10 MG capsule Take 10 mg by mouth 2 (two) times daily as needed. 12/17/20   [provider]  HYDROmorphone (DILAUDID) 4 MG tablet Take 1-2 tablets (4-8 mg total) by mouth every 4 (four) hours as needed for moderate pain or severe pain. Patient not taking: Reported on 01/17/2023 01/24/20   Cassandria Anger, PA-C  ibuprofen (ADVIL) 600 MG tablet Take 1 tablet (600 mg total) by mouth every 6 (six) hours as needed. Patient not taking: Reported on 01/17/2023 11/27/20   Lorre Nick, MD  metoCLOPramide (REGLAN) 10 MG tablet TAKE 1 TABLET (10 MG TOTAL) BY MOUTH 3 (THREE) TIMES DAILY BEFORE MEALS. Patient not  taking: Reported on 01/17/2023 01/15/20   Iva Boop, MD    Current Facility-Administered Medications  Medication Dose Route Frequency Provider Last Rate Last Admin   acetaminophen (TYLENOL) tablet 650 mg  650 mg Oral Q6H PRN Opyd, Lavone Neri, MD   650 mg at 01/18/23 1457   Or   acetaminophen (TYLENOL) suppository 650 mg  650 mg Rectal Q6H PRN Opyd, Lavone Neri, MD       albuterol (PROVENTIL) (2.5 MG/3ML) 0.083% nebulizer solution 3 mL  3 mL Inhalation Q6H PRN Opyd, Lavone Neri, MD       Atogepant TABS 30 mg  30 mg Oral Daily Opyd, Lavone Neri, MD       atorvastatin (LIPITOR) tablet 40 mg  40 mg Oral Daily Opyd, Lavone Neri, MD   40 mg at 01/19/23 0829   baclofen (LIORESAL) tablet 10 mg  10 mg Oral BID WC Opyd, Lavone Neri, MD   10 mg at 01/19/23 1054   baclofen (LIORESAL) tablet 20 mg  20 mg Oral QHS Opyd, Lavone Neri, MD   20 mg at 01/18/23 2219   buPROPion (WELLBUTRIN XL) 24 hr tablet 450 mg  450 mg Oral Daily Danford Bad, RPH   450 mg at 01/19/23 0830   busPIRone (BUSPAR) tablet 15 mg  15 mg Oral TID Briscoe Deutscher, MD   15 mg at 01/19/23 4098   cyanocobalamin (VITAMIN B12) tablet 1,000 mcg  1,000 mcg Oral Daily Jerald Kief, MD   1,000 mcg at 01/19/23 1191   diclofenac Sodium (VOLTAREN) 1 % topical gel 4 g  4 g Topical QID Briscoe Deutscher, MD   4 g at 01/19/23 0831   dicyclomine (BENTYL) capsule 10 mg  10 mg Oral BID PRN Opyd, Lavone Neri, MD   10 mg at 01/18/23 1230   DULoxetine (CYMBALTA) DR capsule 60 mg  60 mg Oral BID Opyd, Lavone Neri, MD   60 mg at 01/19/23 0830   gabapentin (NEURONTIN) capsule 400 mg  400 mg Oral TID Briscoe Deutscher, MD   400 mg at 01/19/23 0829   HYDROmorphone (DILAUDID) injection 0.5-1 mg  0.5-1 mg Intravenous Q4H PRN Jerald Kief, MD   1 mg at 01/19/23 1110   HYDROmorphone (DILAUDID) tablet 4 mg  4 mg Oral Q4H PRN Jerald Kief, MD   4 mg at 01/19/23 0830   lactated ringers infusion   Intravenous Continuous Jerald Kief, MD 100 mL/hr at 01/19/23 0604 New Bag  at 01/19/23 0604   LORazepam (ATIVAN) tablet 1 mg  1 mg Oral BID PRN Briscoe Deutscher, MD   1 mg at 01/17/23 2326   mesalamine (LIALDA) EC tablet 2.4 g  2.4 g Oral Q breakfast Opyd, Lavone Neri, MD   2.4 g at 01/19/23 0830   mirtazapine (REMERON) tablet 15 mg  15 mg Oral QHS PRN Opyd, Lavone Neri, MD       ondansetron (ZOFRAN) tablet 4 mg  4 mg Oral Q6H PRN Opyd, Lavone Neri, MD   4 mg at 01/19/23 4782   Or   ondansetron (ZOFRAN) injection 4 mg  4 mg Intravenous Q6H PRN Opyd, Lavone Neri, MD  4 mg at 01/18/23 2016   oxybutynin (DITROPAN) tablet 5 mg  5 mg Oral Daily Opyd, Lavone Neri, MD   5 mg at 01/19/23 0829   pantoprazole (PROTONIX) EC tablet 40 mg  40 mg Oral BID Briscoe Deutscher, MD   40 mg at 01/19/23 1610   promethazine (PHENERGAN) 12.5 mg in sodium chloride 0.9 % 50 mL IVPB  12.5 mg Intravenous Q6H PRN Opyd, Lavone Neri, MD       sucralfate (CARAFATE) tablet 1 g  1 g Oral BID Opyd, Lavone Neri, MD   1 g at 01/19/23 0830    Allergies as of 01/17/2023 - Review Complete 01/17/2023  Allergen Reaction Noted   Coconut (cocos nucifera) Anaphylaxis and Itching 06/14/2017   Coconut oil Anaphylaxis, Itching, and Other (See Comments) 06/14/2017   Morphine and related Anaphylaxis and Hives 01/14/2016   Oxycodone Anaphylaxis 09/02/2011   Oxycodone-acetaminophen Anaphylaxis 10/08/2021   Ciprofloxacin Itching and Rash 04/20/2012   Penicillamine Hives and Other (See Comments) 08/02/2018   Trazodone and nefazodone Rash 04/10/2019   Vistaril [hydroxyzine hcl] Hives 04/10/2019   Doxycycline Nausea And Vomiting 09/02/2011   Silicone  06/11/2022   Penicillins Rash 09/02/2011   Tape Rash 01/31/2017    Family History  Problem Relation Age of Onset   Hypertension Mother    Diabetes Mother    Sarcoidosis Mother        lungs and skin   Asthma Mother    Hypertension Father    Diabetes Father    Asthma Son    Tics Son    Asthma Sister    Cancer Sister        possible pancreatic cancer   Adrenal disorder  Sister        Tumor    Asthma Brother    Asthma Daughter        died age 31.5   Cancer Daughter 4       brain; died age 31.5   Asthma Son    Cancer Paternal Aunt        brain, colon, lung and esophagus; unsure of primary    Breast cancer Paternal Aunt     Social History   Socioeconomic History   Marital status: Single    Spouse name: Not on file   Number of children: 3   Years of education: Some college   Highest education level: Not on file  Occupational History   Occupation: English as a second language teacher    Comment: Currently out on disability d/t surg  Tobacco Use   Smoking status: Former    Packs/day: 0.50    Years: 20.00    Additional pack years: 0.00    Total pack years: 10.00    Types: Cigarettes    Quit date: 03/17/2014    Years since quitting: 8.8   Smokeless tobacco: Never  Vaping Use   Vaping Use: Never used  Substance and Sexual Activity   Alcohol use: Yes    Alcohol/week: 0.0 standard drinks of alcohol   Drug use: No   Sexual activity: Not Currently    Birth control/protection: Surgical, Abstinence  Other Topics Concern   Not on file  Social History Narrative   Lives with 2 sons   Right Handed   Drinks caffeine2-3 cups daily   Social Determinants of Health   Financial Resource Strain: Low Risk  (04/08/2019)   Overall Financial Resource Strain (CARDIA)    Difficulty of Paying Living Expenses: Not hard at all  Food Insecurity: No  Food Insecurity (01/17/2023)   Hunger Vital Sign    Worried About Running Out of Food in the Last Year: Never true    Ran Out of Food in the Last Year: Never true  Transportation Needs: No Transportation Needs (01/17/2023)   PRAPARE - Administrator, Civil Service (Medical): No    Lack of Transportation (Non-Medical): No  Physical Activity: Inactive (04/08/2019)   Exercise Vital Sign    Days of Exercise per Week: 0 days    Minutes of Exercise per Session: 0 min  Stress: Stress Concern Present (04/08/2019)   [employer] of Occupational Health - Occupational Stress Questionnaire    Feeling of Stress : To some extent  Social Connections: Socially Isolated (04/08/2019)   Social Connection and Isolation Panel [NHANES]    Frequency of Communication with Friends and Family: Once a week    Frequency of Social Gatherings with Friends and Family: Once a week    Attends Religious Services: Never    Database administrator or Organizations: No    Attends Engineer, structural: Not asked    Marital Status: Separated  Intimate Partner Violence: Not At Risk (01/17/2023)   Humiliation, Afraid, Rape, and Kick questionnaire    Fear of Current or Ex-Partner: No    Emotionally Abused: No    Physically Abused: No    Sexually Abused: No    Review of Systems: As per HPI  Physical Exam: Vital signs in last 24 hours: Temp:  [98 F (36.7 C)-98.4 F (36.9 C)] 98 F (36.7 C) (04/23 0603) Pulse Rate:  [83-96] 84 (04/23 1210) Resp:  [18] 18 (04/23 1210) BP: (101-108)/(56-68) 108/68 (04/23 1210) SpO2:  [100 %] 100 % (04/23 1210) Weight:  [73.3 kg] 73.3 kg (04/23 0500) Last BM Date : 01/12/23  General:   Alert,  Well-developed, well-nourished, pleasant and cooperative in NAD Head:  Normocephalic and atraumatic. Eyes:  Sclera clear, no icterus.  Prominent pallor Ears:  Normal auditory acuity. Nose:  No deformity, discharge,  or lesions. Mouth:  No deformity or lesions.  Oropharynx pink & moist. Neck:  Supple; no masses or thyromegaly. Lungs:  Clear throughout to auscultation.   No wheezes, crackles, or rhonchi. No acute distress. Heart:  Regular rate and rhythm; no murmurs, clicks, rubs,  or gallops. Extremities:  Without clubbing or edema. Neurologic:  Alert and  oriented x4;  grossly normal neurologically. Skin:  Intact without significant lesions or rashes. Psych:  Alert and cooperative. Normal mood and affect. Abdomen:  Soft, prominent tenderness located over left upper quadrant and epigastric area  without rebound tenderness, rigidity or guarding and nondistended. No masses, hepatosplenomegaly or hernias noted. Normal bowel sounds, without guarding, and without rebound.         Lab Results: Recent Labs    01/17/23 1634 01/17/23 1723 01/18/23 0349 01/19/23 1039  WBC 15.4*  --  12.2* 7.8  HGB 11.5* 11.2* 9.0* 7.8*  HCT 34.7* 33.0* 27.3* 24.7*  PLT 339  --  256 243   BMET Recent Labs    01/17/23 1634 01/17/23 1723 01/18/23 0349 01/19/23 0439  NA 135 135 138 137  K 4.2 3.7 3.4* 3.8  CL 93* 96* 103 104  CO2 28  --  27 27  GLUCOSE 149* 124* 111* 86  BUN 22* 22* 20 9  CREATININE 1.10* 0.70 0.74 0.68  CALCIUM 9.5  --  8.8* 8.3*   LFT Recent Labs    01/17/23 1634  PROT 8.2*  ALBUMIN 4.1  AST 24  ALT 13  ALKPHOS 51  BILITOT 0.8   PT/INR No results for input(s): "LABPROT", "INR" in the last 72 hours.  Studies/Results: CT HEAD WO CONTRAST ( )  Result Date: 01/18/2023 CLINICAL DATA:  Poly trauma EXAM: CT HEAD WITHOUT CONTRAST TECHNIQUE: Contiguous axial images were obtained from the base of the skull through the vertex without intravenous contrast. RADIATION DOSE REDUCTION: This exam was performed according to the departmental dose-optimization program which includes automated exposure control, adjustment of the mA and/or kV according to patient size and/or use of iterative reconstruction technique. COMPARISON:  11/27/2020 CT head, correlation is made with MRI head 07/12/2022 FINDINGS: Brain: No evidence of acute infarction, hemorrhage, mass, mass effect, or midline shift. No hydrocephalus or extra-axial fluid collection. Vascular: No hyperdense vessel. Skull: Negative for fracture or focal lesion. Sinuses/Orbits: No acute finding. Other: The mastoid air cells are well aerated. IMPRESSION: No acute intracranial process. Electronically Signed   By: Wiliam Ke M.D.   On: 01/18/2023 16:15   CT ABDOMEN PELVIS W CONTRAST  Result Date: 01/17/2023 CLINICAL DATA:  Bowel  obstruction suspected EXAM: CT ABDOMEN AND PELVIS WITH CONTRAST TECHNIQUE: Multidetector CT imaging of the abdomen and pelvis was performed using the standard protocol following bolus administration of intravenous contrast. RADIATION DOSE REDUCTION: This exam was performed according to the departmental dose-optimization program which includes automated exposure control, adjustment of the mA and/or kV according to patient size and/or use of iterative reconstruction technique. CONTRAST:  OMNIPAQUE IOHEXOL 300 MG/ML  SOLN COMPARISON:  09/28/2021 FINDINGS: Lower chest: No acute abnormality Hepatobiliary: No focal hepatic abnormality. Gallbladder unremarkable. Pancreas: No focal abnormality or ductal dilatation. Spleen: 2 cm low-density lesion in the spleen, stable since prior study, likely cyst. Normal size. Adrenals/Urinary Tract: No focal hepatic abnormality. Gallbladder unremarkable. Stomach/Bowel: Normal appendix. Prior gastric bypass. There is mild wall thickening within the jejunal loop of small bowel extending from the gastrojejunostomy. This may reflect enteritis. No evidence of bowel obstruction. Normal appendix. Large bowel unremarkable. Vascular/Lymphatic: No evidence of aneurysm or adenopathy. Reproductive: Prior hysterectomy.  No adnexal masses. Other: No free fluid or free air. Musculoskeletal: No acute bony abnormality. IMPRESSION: Prior gastric bypass. The small bowel loop extending from the stomach anastomosis appears thick walled. This may reflect enteritis. Remainder of the small bowel is unremarkable. No bowel obstruction. Electronically Signed   By: Charlett Nose M.D.   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 She has stable hemodynamics and has not required blood transfusion. The risks and the benefits of the procedure were discussed with the patient in details. She understands and verbalizes consent.   LOS: 1 day   Kerin Salen, MD  01/19/2023, 12:41 PM

## 2023-01-19 NOTE — H&P (View-Only) (Signed)
[facility] Consult  Referring Provider: [facility] hospitalist/Dr. Stephen Chiu Primary Care Physician:  [place], Laura Lee, FNP Primary Gastroenterologist: Unassigned, sees GI at [facility]  Reason for Consultation: Nausea, vomiting, abnormal CAT scan  HPI: [name] is a 46 y.o. female with history of ulcerative colitis, diagnosed in 2015, on Lialda 1.2 g twice a day, history of bariatric surgery with revision and gastric bypass in 2021, was admitted on 01/17/2023 with nausea, vomiting and abdominal pain. Patient reports intermittent episodes of cyclical nausea, vomiting and abdominal pain, denies use of marijuana.  Since her bypass she is focusing more on liquids as a primary source of oral intake, primarily protein shakes, however for the last several months she has intractable nausea and vomiting and has not been able to keep even liquids down.  Patient states she is usually constipated and recently has been doing well with Linzess 290 mcg along with Symproic 0.2 tablets daily.  With this medication she has a bowel movement every day, intermittently notices blood.  She states her last colonoscopy was at [facility] a year ago. For the last 3 days patient has also noticed dark sometimes black stools.  She denies use of NSAIDs or Pepto-Bismol.  Patient has had significant weight loss since her gastric bypass, was about 240 pounds, at one point postprocedure was unable to tolerate oral intake and needed a feeding tube which has been subsequently removed, currently weighs between 150 to 160 pounds.  She struggles with acid reflux and heartburn for which she takes over-the-counter PPIs.  She has not had any difficulty or pain on swallowing.   Past Medical History:  Diagnosis Date   Anal fissure - posterior 10/16/2014    OCC  ISSUES   Anxiety    doesn't take anything   Asthma    has Albuterol inhaler as needed   Boil    on pubic area started septra  03-01-17 draining blood and pus   Bronchitis    Chronic headache disorder 07/22/2016   Clostridium difficile infection 04/20/2012   Colitis    Dehydration    Depression    Family history of adverse reaction to anesthesia    pt mom gets sick   GERD (gastroesophageal reflux disease)    takes Pantoprazole daily   Headache    Hiatal hernia    neuropathy - mild in arms and legs   History of blood transfusion    last transfusion was 04/04/2016=Benadryl was given d/t itching. States she always itches with transfusion.    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   Marginal ulcer 10/25/2017   Migraine    Motion sickness    Nausea    takes Zofran as needed   Nausea and vomiting    for 1 year   Obesity    Oligouria    Osteoarthritis    lower back, knees, wrists - no meds   Peripheral neuropathy 03/01/2019   Pneumonia 1997   Postoperative nausea and vomiting 01/21/2016   wants scopolamine patch   Right carpal   tunnel syndrome 03/01/2019   SVD (spontaneous vaginal delivery)    x 4   Tingling    BOTH LOWER EXTREMETIES ALL THE TIME DUE TO RAPID WEIGHT LOSS   TOBACCO USER 10/02/2009   Qualifier: Diagnosis of  By: Inman MD, Evan     Transfusion history    last transfusion 6'16    Tremor 08/31/2018   UC (ulcerative colitis)    supposed to be taking Lialda and Bentyl but has been off since gastric sleeve   Vertigo    doesn't take any meds    Past Surgical History:  Procedure Laterality Date   ABDOMINAL HYSTERECTOMY     PARTIAL   COLONOSCOPY  2007   for rectal bleeding; [facility]   COLONOSCOPY N/A 06/11/2014   Procedure: COLONOSCOPY;  Surgeon: Carl E Gessner, MD;   Location: [facility];  Service: Endoscopy;  Laterality: N/A;   COLONOSCOPY WITH PROPOFOL N/A 03/02/2017   Procedure: COLONOSCOPY WITH PROPOFOL;  Surgeon: Newman, David, MD;  Location: [facility];  Service: General;  Laterality: N/A;   DILATION AND CURETTAGE OF UTERUS     ESOPHAGOGASTRODUODENOSCOPY (EGD) WITH PROPOFOL N/A 04/06/2016   Procedure: ESOPHAGOGASTRODUODENOSCOPY (EGD) WITH PROPOFOL;  Surgeon: David Newman, MD;  Location: [facility];  Service: General;  Laterality: N/A;   ESOPHAGOGASTRODUODENOSCOPY (EGD) WITH PROPOFOL N/A 03/02/2017   Procedure: ESOPHAGOGASTRODUODENOSCOPY (EGD) WITH PROPOFOL;  Surgeon: Newman, David, MD;  Location: [facility];  Service: General;  Laterality: N/A;   ESOPHAGOGASTRODUODENOSCOPY (EGD) WITH PROPOFOL N/A 05/20/2017   Procedure: ESOPHAGOGASTRODUODENOSCOPY (EGD) WITH PROPOFOL ERAS PATHWAY;  Surgeon: Newman, David, MD;  [facility]: [facility];  Service: General;  Laterality: N/A;   ESOPHAGOGASTRODUODENOSCOPY (EGD) WITH PROPOFOL N/A 10/07/2017   Procedure: ESOPHAGOGASTRODUODENOSCOPY (EGD) WITH PROPOFOL;  Surgeon: Newman, David, MD;  Location: [facility];  Service: General;  Laterality: N/A;   EXCISION OF SKIN TAG  06/08/2017   Procedure: EXCISION OF VULVAR SKIN TAGS X2;  Surgeon: Pratt, Tanya S, MD;  Location: [facility];  Service: Gynecology;;   FOOT SURGERY Bilateral    x 2   GASTRIC ROUX-EN-Y N/A 12/14/2016   Procedure: LAPAROSCOPIC REVISION SLEEVE GASTRECTOMY TO  ROUX-Y-GASTRIC BY-PASS, UPPER ENDO;  Surgeon: Benjamin Hoxworth, MD;  Location: [facility];  Service: General;  Laterality: N/A;   GASTROJEJUNOSTOMY N/A 05/11/2016   Procedure: LAPAROSCOPIC PLACEMENT  OF FEEDING  JEJUNOSTOMY TUBE;  Surgeon: Benjamin Hoxworth, MD;  Location: [facility];  Service: General;  Laterality: N/A;   HEMORRHOIDECTOMY WITH HEMORRHOID BANDING     IR FLUORO GUIDE CV LINE RIGHT  04/06/2017   IR GENERIC HISTORICAL  05/19/2016   IR CM INJ ANY COLONIC TUBE W/FLUORO 05/19/2016 Glenn Yamagata, MD  [facility]   IR PATIENT EVAL TECH 0-60 MINS  11/30/2017   IR REPLC DUODEN/JEJUNO TUBE PERCUT W/FLUORO  03/14/2018   IR REPLC DUODEN/JEJUNO TUBE PERCUT W/FLUORO  03/15/2018   IR US GUIDE VASC ACCESS RIGHT  04/06/2017   j tube removed march 2018     KNEE ARTHROSCOPY Left 09/06/2014   LAPAROSCOPIC GASTRIC SLEEVE RESECTION N/A 01/14/2016   Procedure: LAPAROSCOPIC GASTRIC SLEEVE RESECTION;  Surgeon: Benjamin Hoxworth, MD;  Location: [facility];  Service: General;  Laterality: N/A;   LAPAROSCOPIC REVISION OF GASTROJEJUNOSTOMY N/A 10/25/2017   Procedure: LAPAROSCOPIC REVISION OF GASTROJEJUNOSTOMY AND PARTIAL GASTRECTOMY, WITH PLACEMENT OF FEEDING GASTROSTOMY TUBE;  Surgeon: Hoxworth, Benjamin, MD;  Location: [facility];  Service: General;  Laterality: N/A;   LAPAROSCOPIC TUBAL LIGATION  10/16/2011   Procedure: LAPAROSCOPIC TUBAL LIGATION;  Surgeon: Sandra A Rivard, MD;    Location: [facility];  Service: Gynecology;  Laterality: Bilateral;   NOVASURE ABLATION  09/28/2010   mild persistent vaginal bleeding   right knee arthroscopy     05/07/2016   TOTAL KNEE ARTHROPLASTY Left 03/10/2018   Procedure: LEFT TOTAL KNEE ARTHROPLASTY;  Surgeon: Olin, Matthew, MD;  Location: [facility];  Service: Orthopedics;  Laterality: Left;  70 mins   TOTAL KNEE ARTHROPLASTY Right 01/23/2020   Procedure: TOTAL KNEE ARTHROPLASTY, BILATERAL TROCHANTERIC INJECTION;  Surgeon: Olin, Matthew, MD;  Location: [facility];  Service: Orthopedics;  Laterality: Right;  70 mins for Bilateral Troch injection 1 cc Depo and 2 CC Lidocaine   TUBAL LIGATION     VAGINAL HYSTERECTOMY Bilateral 06/08/2017   Procedure: HYSTERECTOMY VAGINAL W/ BILATERAL SALPINGECTOMY;  Surgeon: Pratt, Tanya S, MD;  Location: [facility];  Service: Gynecology;  Laterality: Bilateral;   wisdom teeth extracted      Prior to Admission medications   Medication Sig Start Date End Date Taking? Authorizing Provider  albuterol (VENTOLIN HFA) 108 (90 Base) MCG/ACT inhaler Inhale 2 puffs into the lungs  every 6 (six) hours as needed for wheezing or shortness of breath.   Yes [provider]  Atogepant (QULIPTA) 30 MG TABS Take 1 tablet (30 mg total) by mouth daily. 11/02/22  Yes Chima, Jennifer, MD  atorvastatin (LIPITOR) 40 MG tablet Take 40 mg by mouth daily. 12/10/22  Yes [provider]  baclofen (LIORESAL) 10 MG tablet TAKE 1 TABLET BY MOUTH EVERYDAY AT BEDTIME Patient taking differently: Take 10 mg by mouth in the morning, at noon, and at bedtime. Take 1 tablet (10 mg) in morning, noon and Take 2 tablets (20 mg) at bedtime 12/18/20  Yes Slack, [name] J, NP  buPROPion (WELLBUTRIN XL) 150 MG 24 hr tablet Take 150 mg by mouth every morning. Take along with 300 mg=450 mg   Yes [provider]  buPROPion (WELLBUTRIN XL) 300 MG 24 hr tablet Take 300 mg by mouth every morning. Take along with 150 mg=450 mg 06/05/20  Yes [provider]  busPIRone (BUSPAR) 15 MG tablet Take 15 mg by mouth 3 (three) times daily.   Yes [provider]  celecoxib (CELEBREX) 200 MG capsule Take 1 capsule (200 mg total) by mouth 2 (two) times daily. 01/24/20  Yes Donovan, Ashley R, PA-C  Cholecalciferol (VITAMIN D3) 1.25 MG (50000 UT) CAPS Take 50,000 Units by mouth every 7 (seven) days.  04/03/19  Yes [provider]  cyanocobalamin (,VITAMIN B-12,) 1000 MCG/ML injection Inject 1,000 mcg into the muscle 2 (two) times a week.  04/06/19  Yes [provider]  diclofenac Sodium (VOLTAREN) 1 % GEL Apply topically 4 (four) times daily.   Yes [provider]  DULoxetine (CYMBALTA) 60 MG capsule Take 1 capsule (60 mg total) by mouth 2 (two) times daily. 04/10/19  Yes Sykes, Janet E, NP  EPINEPHrine 0.3 mg/0.3 mL IJ SOAJ injection Inject 0.3 mg into the muscle as needed for anaphylaxis. 04/12/21  Yes Badalamente, Peter R, PA-C  ferrous sulfate 325 (65 FE) MG tablet Take 1 tablet (325 mg total) by mouth 2 (two) times daily with a meal. Patient taking differently: Take 325 mg  by mouth daily with breakfast. 01/24/20  Yes Donovan, Ashley R, PA-C  fluticasone (FLONASE) 50 MCG/ACT nasal spray Place 2 sprays into both nostrils daily as needed for allergies or rhinitis.   Yes [provider]  gabapentin (NEURONTIN) 400 MG capsule Take 400 mg by mouth 3 (three) times daily.     Yes [provider]  HYDROcodone-acetaminophen (NORCO/VICODIN) 5-325 MG tablet Take 1 tablet by mouth every 6 (six) hours as needed for moderate pain.   Yes [provider]  hydrocortisone (ANUSOL-HC) 2.5 % rectal cream Place rectally. 07/29/18  Yes [provider]  HYDROmorphone (DILAUDID) 4 MG tablet Take 1 tablet (4 mg total) by mouth every 3 (three) hours. 01/07/23  Yes   linaclotide (LINZESS) 290 MCG CAPS capsule Take 290 mcg by mouth daily before breakfast.   Yes [provider]  LORazepam (ATIVAN) 1 MG tablet Take 1 mg by mouth 2 (two) times daily as needed for anxiety.   Yes [provider]  magnesium oxide (MAG-OX) 400 (240 Mg) MG tablet Take 1 tablet by mouth daily. 12/19/22  Yes [provider]  meclizine (ANTIVERT) 25 MG tablet Take 25 mg by mouth 3 (three) times daily. 04/15/22  Yes [provider]  mesalamine (LIALDA) 1.2 g EC tablet Take 2.4 g by mouth daily with breakfast.   Yes [provider]  mirtazapine (REMERON) 15 MG tablet Take 15 mg by mouth at bedtime as needed (sleep).   Yes [provider]  Multiple Vitamin (MULTIVITAMIN WITH MINERALS) TABS tablet Take 1 tablet by mouth daily.   Yes [provider]  NARCAN 4 MG/0.1ML LIQD nasal spray kit Place 1 spray into the nose as directed. 03/18/20  Yes [provider]  ondansetron (ZOFRAN) 8 MG tablet Take 8 mg by mouth every 8 (eight) hours as needed for nausea or vomiting.   Yes [provider]  oxybutynin (DITROPAN) 5 MG tablet Take 5 mg by mouth daily.   Yes [provider]  OZEMPIC, 2 MG/DOSE, 8 MG/3ML SOPN Inject 2 mg  into the skin once a week.   Yes [provider]  pantoprazole (PROTONIX) 40 MG tablet TAKE 1 TABLET BY MOUTH TWICE A DAY BEFORE A MEAL 02/22/20  Yes Gessner, Carl E, MD  phentermine (ADIPEX-P) 37.5 MG tablet Take 37.5 mg by mouth daily before breakfast.   Yes [provider]  sucralfate (CARAFATE) 1 g tablet Take 1 g by mouth 2 (two) times daily. 06/12/20  Yes [provider]  SUMAtriptan (IMITREX) 50 MG tablet Take 1 tablet (50 mg total) by mouth every 2 (two) hours as needed for migraine. May repeat in 2 hours if headache persists or recurs. 11/02/22  Yes Chima, Jennifer, MD  SYMPROIC 0.2 MG TABS Take 1 tablet by mouth daily. 05/13/21  Yes [provider]  thiamine (VITAMIN B-1) 100 MG tablet Take 100 mg by mouth daily at 12 noon.    Yes [provider]  torsemide (DEMADEX) 10 MG tablet Take 10 mg by mouth daily. 11/10/21  Yes [provider]  botulinum toxin Type A (BOTOX) 200 units injection INJECT 155 UNITS INTRAMUSCULARLY INTO HEAD AND NECK MUSCLES EVERY 3 MONTHS 11/23/22   Yan, Yijun, MD  botulinum toxin Type A (BOTOX) 200 units injection Inject 200 Units into the muscle every 3 (three) months. Inject into head, neck and shoulders every three months Patient not taking: Reported on 01/17/2023 12/08/22   Slack, [name] J, NP  dicyclomine (BENTYL) 10 MG capsule Take 10 mg by mouth 2 (two) times daily as needed. 12/17/20   [provider]  HYDROmorphone (DILAUDID) 4 MG tablet Take 1-2 tablets (4-8 mg total) by mouth every 4 (four) hours as needed for moderate pain or severe pain. Patient not taking: Reported on 01/17/2023 01/24/20   Donovan, Ashley R, PA-C    ibuprofen (ADVIL) 600 MG tablet Take 1 tablet (600 mg total) by mouth every 6 (six) hours as needed. Patient not taking: Reported on 01/17/2023 11/27/20   Allen, Anthony, MD  metoCLOPramide (REGLAN) 10 MG tablet TAKE 1 TABLET (10 MG TOTAL) BY MOUTH 3 (THREE) TIMES DAILY BEFORE MEALS. Patient not  taking: Reported on 01/17/2023 01/15/20   Gessner, Carl E, MD    Current Facility-Administered Medications  Medication Dose Route Frequency Provider Last Rate Last Admin   acetaminophen (TYLENOL) tablet 650 mg  650 mg Oral Q6H PRN Opyd, Timothy S, MD   650 mg at 01/18/23 1457   Or   acetaminophen (TYLENOL) suppository 650 mg  650 mg Rectal Q6H PRN Opyd, Timothy S, MD       albuterol (PROVENTIL) (2.5 MG/3ML) 0.083% nebulizer solution 3 mL  3 mL Inhalation Q6H PRN Opyd, Timothy S, MD       Atogepant TABS 30 mg  30 mg Oral Daily Opyd, Timothy S, MD       atorvastatin (LIPITOR) tablet 40 mg  40 mg Oral Daily Opyd, Timothy S, MD   40 mg at 01/19/23 0829   baclofen (LIORESAL) tablet 10 mg  10 mg Oral BID WC Opyd, Timothy S, MD   10 mg at 01/19/23 1054   baclofen (LIORESAL) tablet 20 mg  20 mg Oral QHS Opyd, Timothy S, MD   20 mg at 01/18/23 2219   buPROPion (WELLBUTRIN XL) 24 hr tablet 450 mg  450 mg Oral Daily Wofford, Drew A, RPH   450 mg at 01/19/23 0830   busPIRone (BUSPAR) tablet 15 mg  15 mg Oral TID Opyd, Timothy S, MD   15 mg at 01/19/23 0829   cyanocobalamin (VITAMIN B12) tablet 1,000 mcg  1,000 mcg Oral Daily Chiu, Stephen K, MD   1,000 mcg at 01/19/23 0837   diclofenac Sodium (VOLTAREN) 1 % topical gel 4 g  4 g Topical QID Opyd, Timothy S, MD   4 g at 01/19/23 0831   dicyclomine (BENTYL) capsule 10 mg  10 mg Oral BID PRN Opyd, Timothy S, MD   10 mg at 01/18/23 1230   DULoxetine (CYMBALTA) DR capsule 60 mg  60 mg Oral BID Opyd, Timothy S, MD   60 mg at 01/19/23 0830   gabapentin (NEURONTIN) capsule 400 mg  400 mg Oral TID Opyd, Timothy S, MD   400 mg at 01/19/23 0829   HYDROmorphone (DILAUDID) injection 0.5-1 mg  0.5-1 mg Intravenous Q4H PRN Chiu, Stephen K, MD   1 mg at 01/19/23 1110   HYDROmorphone (DILAUDID) tablet 4 mg  4 mg Oral Q4H PRN Chiu, Stephen K, MD   4 mg at 01/19/23 0830   lactated ringers infusion   Intravenous Continuous Chiu, Stephen K, MD 100 mL/hr at 01/19/23 0604 New Bag  at 01/19/23 0604   LORazepam (ATIVAN) tablet 1 mg  1 mg Oral BID PRN Opyd, Timothy S, MD   1 mg at 01/17/23 2326   mesalamine (LIALDA) EC tablet 2.4 g  2.4 g Oral Q breakfast Opyd, Timothy S, MD   2.4 g at 01/19/23 0830   mirtazapine (REMERON) tablet 15 mg  15 mg Oral QHS PRN Opyd, Timothy S, MD       ondansetron (ZOFRAN) tablet 4 mg  4 mg Oral Q6H PRN Opyd, Timothy S, MD   4 mg at 01/19/23 0829   Or   ondansetron (ZOFRAN) injection 4 mg  4 mg Intravenous Q6H PRN Opyd, Timothy S, MD     4 mg at 01/18/23 2016   oxybutynin (DITROPAN) tablet 5 mg  5 mg Oral Daily Opyd, Timothy S, MD   5 mg at 01/19/23 0829   pantoprazole (PROTONIX) EC tablet 40 mg  40 mg Oral BID Opyd, Timothy S, MD   40 mg at 01/19/23 0829   promethazine (PHENERGAN) 12.5 mg in sodium chloride 0.9 % 50 mL IVPB  12.5 mg Intravenous Q6H PRN Opyd, Timothy S, MD       sucralfate (CARAFATE) tablet 1 g  1 g Oral BID Opyd, Timothy S, MD   1 g at 01/19/23 0830    Allergies as of 01/17/2023 - Review Complete 01/17/2023  Allergen Reaction Noted   Coconut (cocos nucifera) Anaphylaxis and Itching 06/14/2017   Coconut oil Anaphylaxis, Itching, and Other (See Comments) 06/14/2017   Morphine and related Anaphylaxis and Hives 01/14/2016   Oxycodone Anaphylaxis 09/02/2011   Oxycodone-acetaminophen Anaphylaxis 10/08/2021   Ciprofloxacin Itching and Rash 04/20/2012   Penicillamine Hives and Other (See Comments) 08/02/2018   Trazodone and nefazodone Rash 04/10/2019   Vistaril [hydroxyzine hcl] Hives 04/10/2019   Doxycycline Nausea And Vomiting 09/02/2011   Silicone  06/11/2022   Penicillins Rash 09/02/2011   Tape Rash 01/31/2017    Family History  Problem Relation Age of Onset   Hypertension Mother    Diabetes Mother    Sarcoidosis Mother        lungs and skin   Asthma Mother    Hypertension Father    Diabetes Father    Asthma Son    Tics Son    Asthma Sister    Cancer Sister        possible pancreatic cancer   Adrenal disorder  Sister        Tumor    Asthma Brother    Asthma Daughter        died age 4.5   Cancer Daughter 4       brain; died age 4.5   Asthma Son    Cancer Paternal Aunt        brain, colon, lung and esophagus; unsure of primary    Breast cancer Paternal Aunt     Social History   Socioeconomic History   Marital status: Single    Spouse name: Not on file   Number of children: 3   Years of education: Some college   Highest education level: Not on file  Occupational History   Occupation: [employer]    Comment: Currently out on disability d/t surg  Tobacco Use   Smoking status: Former    Packs/day: 0.50    Years: 20.00    Additional pack years: 0.00    Total pack years: 10.00    Types: Cigarettes    Quit date: 03/17/2014    Years since quitting: 8.8   Smokeless tobacco: Never  Vaping Use   Vaping Use: Never used  Substance and Sexual Activity   Alcohol use: Yes    Alcohol/week: 0.0 standard drinks of alcohol   Drug use: No   Sexual activity: Not Currently    Birth control/protection: Surgical, Abstinence  Other Topics Concern   Not on file  Social History Narrative   Lives with 2 sons   Right Handed   Drinks caffeine2-3 cups daily   Social Determinants of Health   Financial Resource Strain: Low Risk  (04/08/2019)   Overall Financial Resource Strain (CARDIA)    Difficulty of Paying Living Expenses: Not hard at all  Food Insecurity: No   Food Insecurity (01/17/2023)   Hunger Vital Sign    Worried About Running Out of Food in the Last Year: Never true    Ran Out of Food in the Last Year: Never true  Transportation Needs: No Transportation Needs (01/17/2023)   PRAPARE - Transportation    Lack of Transportation (Medical): No    Lack of Transportation (Non-Medical): No  Physical Activity: Inactive (04/08/2019)   Exercise Vital Sign    Days of Exercise per Week: 0 days    Minutes of Exercise per Session: 0 min  Stress: Stress Concern Present (04/08/2019)   [place]  [employer] - Occupational Stress Questionnaire    Feeling of Stress : To some extent  Social Connections: Socially Isolated (04/08/2019)   Social Connection and Isolation Panel [NHANES]    Frequency of Communication with Friends and Family: Once a week    Frequency of Social Gatherings with Friends and Family: Once a week    Attends Religious Services: Never    Active Member of Clubs or Organizations: No    Attends Club or Organization Meetings: Not asked    Marital Status: Separated  Intimate Partner Violence: Not At Risk (01/17/2023)   Humiliation, Afraid, Rape, and Kick questionnaire    Fear of Current or Ex-Partner: No    Emotionally Abused: No    Physically Abused: No    Sexually Abused: No    Review of Systems: As per HPI  Physical Exam: Vital signs in last 24 hours: Temp:  [98 F (36.7 C)-98.4 F (36.9 C)] 98 F (36.7 C) (04/23 0603) Pulse Rate:  [83-96] 84 (04/23 1210) Resp:  [18] 18 (04/23 1210) BP: (101-108)/(56-68) 108/68 (04/23 1210) SpO2:  [100 %] 100 % (04/23 1210) Weight:  [73.3 kg] 73.3 kg (04/23 0500) Last BM Date : 01/12/23  General:   Alert,  Well-developed, well-nourished, pleasant and cooperative in NAD Head:  Normocephalic and atraumatic. Eyes:  Sclera clear, no icterus.  Prominent pallor Ears:  Normal auditory acuity. Nose:  No deformity, discharge,  or lesions. Mouth:  No deformity or lesions.  Oropharynx pink & moist. Neck:  Supple; no masses or thyromegaly. Lungs:  Clear throughout to auscultation.   No wheezes, crackles, or rhonchi. No acute distress. Heart:  Regular rate and rhythm; no murmurs, clicks, rubs,  or gallops. Extremities:  Without clubbing or edema. Neurologic:  Alert and  oriented x4;  grossly normal neurologically. Skin:  Intact without significant lesions or rashes. Psych:  Alert and cooperative. Normal mood and affect. Abdomen:  Soft, prominent tenderness located over left upper quadrant and epigastric area  without rebound tenderness, rigidity or guarding and nondistended. No masses, hepatosplenomegaly or hernias noted. Normal bowel sounds, without guarding, and without rebound.         Lab Results: Recent Labs    01/17/23 1634 01/17/23 1723 01/18/23 0349 01/19/23 1039  WBC 15.4*  --  12.2* 7.8  HGB 11.5* 11.2* 9.0* 7.8*  HCT 34.7* 33.0* 27.3* 24.7*  PLT 339  --  256 243   BMET Recent Labs    01/17/23 1634 01/17/23 1723 01/18/23 0349 01/19/23 0439  NA 135 135 138 137  K 4.2 3.7 3.4* 3.8  CL 93* 96* 103 104  CO2 28  --  27 27  GLUCOSE 149* 124* 111* 86  BUN 22* 22* 20 9  CREATININE 1.10* 0.70 0.74 0.68  CALCIUM 9.5  --  8.8* 8.3*   LFT Recent Labs    01/17/23 1634  PROT 8.2*    ALBUMIN 4.1  AST 24  ALT 13  ALKPHOS 51  BILITOT 0.8   PT/INR No results for input(s): "LABPROT", "INR" in the last 72 hours.  Studies/Results: CT HEAD WO CONTRAST (5MM)  Result Date: 01/18/2023 CLINICAL DATA:  Poly trauma EXAM: CT HEAD WITHOUT CONTRAST TECHNIQUE: Contiguous axial images were obtained from the base of the skull through the vertex without intravenous contrast. RADIATION DOSE REDUCTION: This exam was performed according to the departmental dose-optimization program which includes automated exposure control, adjustment of the mA and/or kV according to patient size and/or use of iterative reconstruction technique. COMPARISON:  11/27/2020 CT head, correlation is made with MRI head 07/12/2022 FINDINGS: Brain: No evidence of acute infarction, hemorrhage, mass, mass effect, or midline shift. No hydrocephalus or extra-axial fluid collection. Vascular: No hyperdense vessel. Skull: Negative for fracture or focal lesion. Sinuses/Orbits: No acute finding. Other: The mastoid air cells are well aerated. IMPRESSION: No acute intracranial process. Electronically Signed   By: Alison  Vasan M.D.   On: 01/18/2023 16:15   CT ABDOMEN PELVIS W CONTRAST  Result Date: 01/17/2023 CLINICAL DATA:  Bowel  obstruction suspected EXAM: CT ABDOMEN AND PELVIS WITH CONTRAST TECHNIQUE: Multidetector CT imaging of the abdomen and pelvis was performed using the standard protocol following bolus administration of intravenous contrast. RADIATION DOSE REDUCTION: This exam was performed according to the departmental dose-optimization program which includes automated exposure control, adjustment of the mA and/or kV according to patient size and/or use of iterative reconstruction technique. CONTRAST:  100mL OMNIPAQUE IOHEXOL 300 MG/ML  SOLN COMPARISON:  09/28/2021 FINDINGS: Lower chest: No acute abnormality Hepatobiliary: No focal hepatic abnormality. Gallbladder unremarkable. Pancreas: No focal abnormality or ductal dilatation. Spleen: 2 cm low-density lesion in the spleen, stable since prior study, likely cyst. Normal size. Adrenals/Urinary Tract: No focal hepatic abnormality. Gallbladder unremarkable. Stomach/Bowel: Normal appendix. Prior gastric bypass. There is mild wall thickening within the jejunal loop of small bowel extending from the gastrojejunostomy. This may reflect enteritis. No evidence of bowel obstruction. Normal appendix. Large bowel unremarkable. Vascular/Lymphatic: No evidence of aneurysm or adenopathy. Reproductive: Prior hysterectomy.  No adnexal masses. Other: No free fluid or free air. Musculoskeletal: No acute bony abnormality. IMPRESSION: Prior gastric bypass. The small bowel loop extending from the stomach anastomosis appears thick walled. This may reflect enteritis. Remainder of the small bowel is unremarkable. No bowel obstruction. Electronically Signed   By: Kevin  Dover M.D.   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 Patient is already been started on pantoprazole 40 mg twice a day and Carafate 1 g twice a day. She has stable hemodynamics and has not required blood transfusion. The risks and the benefits of the procedure were discussed with the patient in details. She understands and verbalizes consent.   LOS: 1 day   Romario Tith, MD  01/19/2023, 12:41 PM   

## 2023-01-20 ENCOUNTER — Encounter (HOSPITAL_COMMUNITY)
Admission: EM | Disposition: A | Discharge status: Home Health | Payer: Self-pay | Source: Home / Self Care | Attending: Internal Medicine

## 2023-01-20 ENCOUNTER — Inpatient Hospital Stay (HOSPITAL_COMMUNITY): Payer: 59 | Admitting: Anesthesiology

## 2023-01-20 ENCOUNTER — Encounter (HOSPITAL_COMMUNITY): Payer: Self-pay | Admitting: Internal Medicine

## 2023-01-20 DIAGNOSIS — Z87891 Personal history of nicotine dependence: Secondary | ICD-10-CM

## 2023-01-20 DIAGNOSIS — K289 Gastrojejunal ulcer, unspecified as acute or chronic, without hemorrhage or perforation: Secondary | ICD-10-CM

## 2023-01-20 DIAGNOSIS — D500 Iron deficiency anemia secondary to blood loss (chronic): Secondary | ICD-10-CM

## 2023-01-20 DIAGNOSIS — K922 Gastrointestinal hemorrhage, unspecified: Secondary | ICD-10-CM

## 2023-01-20 DIAGNOSIS — E538 Deficiency of other specified B group vitamins: Secondary | ICD-10-CM | POA: Diagnosis not present

## 2023-01-20 DIAGNOSIS — R109 Unspecified abdominal pain: Secondary | ICD-10-CM | POA: Diagnosis not present

## 2023-01-20 DIAGNOSIS — D649 Anemia, unspecified: Secondary | ICD-10-CM

## 2023-01-20 DIAGNOSIS — K229 Disease of esophagus, unspecified: Secondary | ICD-10-CM

## 2023-01-20 DIAGNOSIS — E440 Moderate protein-calorie malnutrition: Secondary | ICD-10-CM | POA: Diagnosis present

## 2023-01-20 DIAGNOSIS — J45909 Unspecified asthma, uncomplicated: Secondary | ICD-10-CM

## 2023-01-20 HISTORY — PX: BIOPSY: SHX5522

## 2023-01-20 HISTORY — PX: ESOPHAGOGASTRODUODENOSCOPY (EGD) WITH PROPOFOL: SHX5813

## 2023-01-20 LAB — HEMOGLOBIN AND HEMATOCRIT, BLOOD
HCT: 27.5 % — ABNORMAL LOW (ref 36.0–46.0)
Hemoglobin: 8.9 g/dL — ABNORMAL LOW (ref 12.0–15.0)

## 2023-01-20 LAB — COMPREHENSIVE METABOLIC PANEL
ALT: 8 U/L (ref 0–44)
AST: 12 U/L — ABNORMAL LOW (ref 15–41)
Albumin: 2.5 g/dL — ABNORMAL LOW (ref 3.5–5.0)
Alkaline Phosphatase: 36 U/L — ABNORMAL LOW (ref 38–126)
Anion gap: 4 — ABNORMAL LOW (ref 5–15)
BUN: 7 mg/dL (ref 6–20)
CO2: 26 mmol/L (ref 22–32)
Calcium: 8.4 mg/dL — ABNORMAL LOW (ref 8.9–10.3)
Chloride: 106 mmol/L (ref 98–111)
Creatinine, Ser: 0.9 mg/dL (ref 0.44–1.00)
GFR, Estimated: >60 mL/min (ref >60)
Glucose, Bld: 88 mg/dL (ref 70–99)
Potassium: 3.9 mmol/L (ref 3.5–5.1)
Sodium: 136 mmol/L (ref 135–145)
Total Bilirubin: 0.3 mg/dL (ref 0.3–1.2)
Total Protein: 5.3 g/dL — ABNORMAL LOW (ref 6.5–8.1)

## 2023-01-20 LAB — RETICULOCYTES
Immature Retic Fract: 31.8 % — ABNORMAL HIGH (ref 2.3–15.9)
RBC.: 2.85 MIL/uL — ABNORMAL LOW (ref 3.87–5.11)
Retic Count, Absolute: 99.8 10*3/uL (ref 19.0–186.0)
Retic Ct Pct: 3.5 % — ABNORMAL HIGH (ref 0.4–3.1)

## 2023-01-20 LAB — TYPE AND SCREEN
ABO/RH(D): O POS
Unit division: 0

## 2023-01-20 LAB — CBC
HCT: 21.2 % — ABNORMAL LOW (ref 36.0–46.0)
Hemoglobin: 6.7 g/dL — CL (ref 12.0–15.0)
MCH: 31 pg (ref 26.0–34.0)
MCHC: 31.6 g/dL (ref 30.0–36.0)
MCV: 98.1 fL (ref 80.0–100.0)
Platelets: 231 10*3/uL (ref 150–400)
RBC: 2.16 MIL/uL — ABNORMAL LOW (ref 3.87–5.11)
RDW: 14.4 % (ref 11.5–15.5)
WBC: 5.7 10*3/uL (ref 4.0–10.5)
nRBC: 0 % (ref 0.0–0.2)

## 2023-01-20 LAB — IRON AND TIBC
Iron: 72 ug/dL (ref 28–170)
Saturation Ratios: 38 % — ABNORMAL HIGH (ref 10.4–31.8)
TIBC: 190 ug/dL — ABNORMAL LOW (ref 250–450)
UIBC: 118 ug/dL

## 2023-01-20 LAB — PREPARE RBC (CROSSMATCH)

## 2023-01-20 LAB — FOLATE: Folate: 7.2 ng/mL (ref >5.9)

## 2023-01-20 LAB — VITAMIN B12: Vitamin B-12: 476 pg/mL (ref 180–914)

## 2023-01-20 LAB — FERRITIN: Ferritin: 85 ng/mL (ref 11–307)

## 2023-01-20 LAB — GLUCOSE, CAPILLARY: Glucose-Capillary: 100 mg/dL — ABNORMAL HIGH (ref 70–99)

## 2023-01-20 LAB — BPAM RBC: ISSUE DATE / TIME: 202404240937

## 2023-01-20 SURGERY — ESOPHAGOGASTRODUODENOSCOPY (EGD) WITH PROPOFOL
Anesthesia: Monitor Anesthesia Care

## 2023-01-20 MED ORDER — SODIUM CHLORIDE 0.9 % IV SOLN: INTRAVENOUS | Status: DC | PRN

## 2023-01-20 MED ORDER — MIDAZOLAM HCL 5 MG/5ML IJ SOLN
INTRAMUSCULAR | Status: DC | PRN
  Administered 2023-01-20: 2 mg via INTRAVENOUS

## 2023-01-20 MED ORDER — CYANOCOBALAMIN 1000 MCG/ML IJ SOLN
1000.0000 ug | INTRAMUSCULAR | Status: DC
  Administered 2023-01-20: 1000 ug via INTRAMUSCULAR
  Filled 2023-01-20: qty 1

## 2023-01-20 MED ORDER — PROPOFOL 500 MG/50ML IV EMUL
INTRAVENOUS | Status: DC | PRN
  Administered 2023-01-20: 120 ug/kg/min via INTRAVENOUS

## 2023-01-20 MED ORDER — THIAMINE MONONITRATE 100 MG PO TABS
100.0000 mg | ORAL_TABLET | Freq: Every day | ORAL | Status: DC
  Administered 2023-01-21: 100 mg via ORAL
  Filled 2023-01-20: qty 1

## 2023-01-20 MED ORDER — LACTATED RINGERS IV SOLN: INTRAVENOUS | Status: DC

## 2023-01-20 MED ORDER — SODIUM CHLORIDE 0.9% IV SOLUTION: Freq: Once | INTRAVENOUS | Status: DC

## 2023-01-20 MED ORDER — SODIUM CHLORIDE 0.9 % IV SOLN: INTRAVENOUS | Status: DC

## 2023-01-20 MED ORDER — PROPOFOL 500 MG/50ML IV EMUL
INTRAVENOUS | Status: AC
  Filled 2023-01-20: qty 50

## 2023-01-20 MED ORDER — FERROUS SULFATE 325 (65 FE) MG PO TABS
325.0000 mg | ORAL_TABLET | Freq: Two times a day (BID) | ORAL | Status: DC
  Administered 2023-01-20 – 2023-01-21 (×2): 325 mg via ORAL
  Filled 2023-01-20 (×2): qty 1

## 2023-01-20 MED ORDER — MAGNESIUM OXIDE -MG SUPPLEMENT 400 (240 MG) MG PO TABS
400.0000 mg | ORAL_TABLET | Freq: Every day | ORAL | Status: DC
  Administered 2023-01-20 – 2023-01-21 (×2): 400 mg via ORAL
  Filled 2023-01-20 (×2): qty 1

## 2023-01-20 MED ORDER — MIDAZOLAM HCL 2 MG/2ML IJ SOLN
INTRAMUSCULAR | Status: AC
  Filled 2023-01-20: qty 2

## 2023-01-20 MED ORDER — SUCRALFATE 1 G PO TABS
1.0000 g | ORAL_TABLET | Freq: Four times a day (QID) | ORAL | Status: DC
  Administered 2023-01-20 – 2023-01-21 (×4): 1 g via ORAL
  Filled 2023-01-20 (×5): qty 1

## 2023-01-20 SURGICAL SUPPLY — 15 items

## 2023-01-20 NOTE — Progress Notes (Signed)
Progress Note   Patient: [name] YQM:[id number] DOB: October 09, 1975 DOA: 01/17/2023     2 DOS: the patient was seen and examined on 01/20/2023   Brief hospital course: 47 y.o. female with medical history significant for depression, anxiety, chronic migraine, ulcerative colitis, and history of gastric bypass who presents with upper abdominal pain, nausea, and vomiting.   Patient reports developing upper abdominal pain with nausea and vomiting on 01/12/2023.  Since then, pain has worsened and become more generalized.  She denies any fever, chills, or diarrhea associated with this.  She has been unable to tolerate even liquids at home unable to control her pain, and came to the ED for evaluation.   ED Course: Upon arrival to the ED, patient is found to be afebrile and saturating well on room air with mild tachycardia and stable blood pressure.  EKG reveals sinus tachycardia with PVC.  CT of the abdomen and pelvis is notable for prior gastric bypass and thick-walled appearance to a small bowel loop extending from the stomach anastomosis.  Labs are notable for normal LFTs, normal lipase, and WBC 15,400.   Patient was treated with IV fluids, 2 doses of fentanyl, Dilaudid, and Zofran in the ED.  4/24: Hemodynamically stable.  Slowly drifting down hemoglobin at 6.7 today.  1 unit of PRBC ordered.  EGD today with jejunal erosions and ulcers involving anastomosis.  Biopsies were taken.  GI is recommending continuation of twice daily PPI and sucralfate and they will follow-up as outpatient.  Assessment and Plan: * Intractable abdominal pain Improved.CT notable for wall-thickening involving jejunal loop extending from gastrojejunal anastomosis. EGD with erosions and ulcers involving anastomosis and jejunum. -Continue with supportive care   Upper GI bleeding Drifting down hemoglobin.  Patient with history of melena last week. EGD with ulceration of anastomosis and jejunum.  Biopsies were taken. -PPI  twice daily -Sacral fate -Outpatient GI follow-up -Advance diet as tolerated  Normocytic anemia Most likely secondary to GI bleed.  Also has an history of iron and B12 deficiency with gastric bypass surgery.  Hemoglobin at 6.7 today. -1 unit of PRBC -Monitoring hemoglobin -Transfuse if below 7 -Continue home iron and B12 supplement -Check anemia panel  B12 deficiency B12 at 195-MMA pending -Continue home B12 supplement, she is on twice weekly IM injections  Malnutrition of moderate degree Dietitian consult -Multiple dietary labs pending  MDD (major depressive disorder), recurrent severe, without psychosis - Continue Cymbalta, Wellbutrin, Buspar, Remeron, and as-needed Ativan   Chronic headache disorder Patient with history of migraine.  No acute concern - Continue Atogepant   Left sided chronic colitis - segmental Patient has an history of ulcerative colitis.  Current CT with unremarkable large bowel. -Continue home mesalamine       Subjective: Patient with no abdominal pain.  She was feeling hungry.  She was having significant melena last week.  Did not had any bowel movement since then.  Awaiting EGD  Physical Exam: Vitals:   01/20/23 1312 01/20/23 1318 01/20/23 1337 01/20/23 1414  BP: 122/76 124/77 108/63 111/69  Pulse: 85 84 78 81  Resp: Temp:   98.6 F (37 C) 98.8 F (37.1 C)  TempSrc:   Oral Oral  SpO2: 96% 96% 100% 100%  Weight:      Height:       General.  Malnourished lady, in no acute distress. Pulmonary.  Lungs clear bilaterally, normal respiratory effort. CV.  Regular rate and rhythm, no JVD, rub or murmur.  Abdomen.  Soft, nontender, nondistended, BS positive. CNS.  Alert and oriented .  No focal neurologic deficit. Extremities.  No edema, no cyanosis, pulses intact and symmetrical. Psychiatry.  Judgment and insight appears normal.   Data Reviewed: Prior data reviewed  Family Communication: Discussed with patient.  Son was on  phone  Disposition: Status is: Inpatient Remains inpatient appropriate because: Severity of illness  Planned Discharge Destination: Home  Time spent: 45 minutes  This record has been created using Conservation officer, historic buildings. Errors have been sought and corrected,but may not always be located. Such creation errors do not reflect on the standard of care.   Author: Arnetha Courser, MD 01/20/2023 2:47 PM  For on call review [email or web address].

## 2023-01-20 NOTE — Assessment & Plan Note (Signed)
Drifting down hemoglobin.  Patient with history of melena last week. EGD with ulceration of anastomosis and jejunum.  Biopsies were taken. -PPI twice daily -Sacral fate -Outpatient GI follow-up -Advance diet as tolerated

## 2023-01-20 NOTE — Progress Notes (Signed)
Febrile episode, temp 101.13f. Prn tylenol given. Johann Capers, NP made aware.

## 2023-01-20 NOTE — Assessment & Plan Note (Addendum)
B12 at 195-MMA pending -Continue home B12 supplement, she is on twice weekly IM injections

## 2023-01-20 NOTE — Assessment & Plan Note (Signed)
Most likely secondary to GI bleed.  Also has an history of iron and B12 deficiency with gastric bypass surgery.  Hemoglobin at 6.7 today. -1 unit of PRBC -Monitoring hemoglobin -Transfuse if below 7 -Continue home iron and B12 supplement -Check anemia panel

## 2023-01-20 NOTE — Assessment & Plan Note (Signed)
Patient has an history of ulcerative colitis.  Current CT with unremarkable large bowel. -Continue home mesalamine

## 2023-01-20 NOTE — Assessment & Plan Note (Signed)
-   Continue Cymbalta, Wellbutrin, Buspar, Remeron, and as-needed Ativan

## 2023-01-20 NOTE — Progress Notes (Signed)
Physical Therapy Treatment Patient Details Name: [name] MRN: [id number] DOB: [birth year] Today's Date: 01/20/2023   History of Present Illness Pt is a 47 yo female admitted with intractable abdominal pain. Head CT No acute intracranial process. PMH: depression, anxiety, chronic migraine, ulcerative colitis, and gastric bypass    PT Comments    Noted pt had Hbg level of 6.7 this morning and is s/p transfusion of 1 unit of blood. Pt is also s/p EGD today. Pt ambulated 160' with RW, no loss of balance. Distance limited by fatigue.    Recommendations for follow up therapy are one component of a multi-disciplinary discharge planning process, led by the attending physician.  Recommendations may be updated based on patient status, additional functional criteria and insurance authorization.  Follow Up Recommendations       Assistance Recommended at Discharge PRN  Patient can return home with the following A little help with bathing/dressing/bathroom;Assistance with cooking/housework;Assist for transportation;Help with stairs or ramp for entrance   Equipment Recommendations  Rollator (4 wheels)    Recommendations for Other Services       Precautions / Restrictions Precautions Precautions: Fall Restrictions Weight Bearing Restrictions: No     Mobility  Bed Mobility               General bed mobility comments: sitting at edge of bed    Transfers Overall transfer level: Needs assistance Equipment used: Rolling walker (2 wheels) Transfers: Sit to/from Stand Sit to Stand: Supervision           General transfer comment: verbal cues for hand placement    Ambulation/Gait Ambulation/Gait assistance: Supervision Gait Distance (Feet): 160 Feet Assistive device: Rolling walker (2 wheels) Gait Pattern/deviations: Decreased stride length, Step-through pattern Gait velocity: decreased     General Gait Details: ambulated in hall, no loss of balance, distance limited  by fatigue   Stairs             Wheelchair Mobility    Modified Rankin (Stroke Patients Only)       Balance                                            Cognition Arousal/Alertness: Awake/alert Behavior During Therapy: WFL for tasks assessed/performed Overall Cognitive Status: Within Functional Limits for tasks assessed                                          Exercises      General Comments        Pertinent Vitals/Pain Pain Assessment Pain Score: 8  Pain Location: abdomen Pain Descriptors / Indicators: Sore Pain Intervention(s): Limited activity within patient's tolerance, Monitored during session, Patient requesting pain meds-RN notified    Home Living                          Prior Function            PT Goals (current goals can now be found in the care plan section) Acute Rehab PT Goals Patient Stated Goal: stop falling PT Goal Formulation: With patient Time For Goal Achievement: 02/02/23 Potential to Achieve Goals: Good Progress towards PT goals: Progressing toward goals    Frequency    Min 1X/week  PT Plan Current plan remains appropriate    Co-evaluation              AM-PAC PT "6 Clicks" Mobility   Outcome Measure  Help needed turning from your back to your side while in a flat bed without using bedrails?: None Help needed moving from lying on your back to sitting on the side of a flat bed without using bedrails?: A Little Help needed moving to and from a bed to a chair (including a wheelchair)?: A Little Help needed standing up from a chair using your arms (e.g., wheelchair or bedside chair)?: A Little Help needed to walk in hospital room?: A Little Help needed climbing 3-5 steps with a railing? : A Little 6 Click Score: 19    End of Session   Activity Tolerance: Patient tolerated treatment well Patient left: in bed;with call bell/phone within reach;with family/visitor  present Nurse Communication: Mobility status PT Visit Diagnosis: Other abnormalities of gait and mobility (R26.89);History of falling (Z91.81);Pain     Time: 1610-9604 PT Time Calculation (min) (ACUTE ONLY): 21 min  Charges:  $Gait Training: 8-22 mins                     Ralene Bathe Kistler PT 01/20/2023  Acute Rehabilitation Services  Office [phone]

## 2023-01-20 NOTE — Interval H&P Note (Signed)
History and Physical Interval Note: 46/female with anemia, nausea, vomiting, abnormal CT, history of gastric bypass,Ulcerative colitis,for an EGD with propofol.  01/20/2023 12:16 PM  [name]  has presented today for EGD, with the diagnosis of nausea,vomiting,black stools, anemia.  The various methods of treatment have been discussed with the patient and family. After consideration of risks, benefits and other options for treatment, the patient has consented to  Procedure(s): ESOPHAGOGASTRODUODENOSCOPY (EGD) WITH PROPOFOL (N/A) as a surgical intervention.  The patient's history has been reviewed, patient examined, no change in status, stable for surgery.  I have reviewed the patient's chart and labs.  Questions were answered to the patient's satisfaction.     Kerin Salen

## 2023-01-20 NOTE — Assessment & Plan Note (Addendum)
Improved.CT notable for wall-thickening involving jejunal loop extending from gastrojejunal anastomosis. EGD with erosions and ulcers involving anastomosis and jejunum. -Continue with supportive care

## 2023-01-20 NOTE — TOC Progression Note (Addendum)
Transition of Care [facility]) - Progression Note    Patient Details  Name: [name] MRN: [id number] Date of Birth: 01/24/1976  [facility] CM/SW Contact  Huston Foley Jacklynn Ganong, RN Phone Number: 01/20/2023, 10:38 AM  Clinical Narrative:    Case manager spoke with patient concerning recommendation for Home Health PT. Discussed choices for agencies, patient has no preference, referral called to Lorenza Chick, [facility] Liaison. No DME needs. Need MD to enter Home health orders.    Expected Discharge Plan: Home w Home Health Services Barriers to Discharge: Continued Medical Work up  Expected Discharge Plan and Services   Discharge Planning Services: CM Consult Post Acute Care Choice: Home Health Living arrangements for the past 2 months: Apartment                 DME Arranged: N/A           HH Agency: [facility] Date [facility] Agency Contacted: 01/20/23 Time HH Agency Contacted: 1030 Representative spoke with at [facility] Agency: Lorenza Chick   Social Determinants of Health (SDOH) Interventions SDOH Screenings   Food Insecurity: No Food Insecurity (01/17/2023)  Housing: Low Risk  (01/17/2023)  Transportation Needs: No Transportation Needs (01/17/2023)  Utilities: Not At Risk (01/17/2023)  Alcohol Screen: Medium Risk (04/08/2019)  Depression (PHQ2-9): Low Risk  (01/05/2020)  Financial Resource Strain: Low Risk  (04/08/2019)  Physical Activity: Inactive (04/08/2019)  Social Connections: Socially Isolated (04/08/2019)  Stress: Stress Concern Present (04/08/2019)  Tobacco Use: Medium Risk (01/17/2023)    Readmission Risk Interventions     No data to display

## 2023-01-20 NOTE — Transfer of Care (Signed)
Immediate Anesthesia Transfer of Care Note  Patient: [name]  Procedure(s) Performed: ESOPHAGOGASTRODUODENOSCOPY (EGD) WITH PROPOFOL BIOPSY  Patient Location: PACU and Endoscopy Unit  Anesthesia Type:MAC  Level of Consciousness: awake, alert , oriented, and patient cooperative  Airway & Oxygen Therapy: Patient Spontanous Breathing and Patient connected to face mask oxygen  Post-op Assessment: Report given to RN, Post -op Vital signs reviewed and stable, and Patient moving all extremities  Post vital signs: Reviewed and stable  Last Vitals:  Vitals Value Taken Time  BP 115/74 01/20/23 1252  Temp 37.1 C 01/20/23 1252  Pulse 96 01/20/23 1256  Resp 35 01/20/23 1256  SpO2 100 % 01/20/23 1256  Vitals shown include unvalidated device data.  Last Pain:  Vitals:   01/20/23 1252  TempSrc: Temporal  PainSc: Asleep      Patients Stated Pain Goal: 2 (01/20/23 0800)  Complications: No notable events documented.

## 2023-01-20 NOTE — Anesthesia Preprocedure Evaluation (Addendum)
Anesthesia Evaluation  Patient identified by MRN, date of birth, ID band Patient awake    Reviewed: Allergy & Precautions, NPO status , Patient's Chart, lab work & pertinent test results  History of Anesthesia Complications (+) PONV and history of anesthetic complications  Airway Mallampati: I  TM Distance: >3 FB Neck ROM: Full    Dental  (+) Partial Lower, Partial Upper, Dental Advisory Given   Pulmonary asthma , former smoker COVID positive 07/2019 Quit smoking 2015, 10 pack year history    Pulmonary exam normal        Cardiovascular negative cardio ROS Normal cardiovascular exam     Neuro/Psych  Headaches PSYCHIATRIC DISORDERS Anxiety Depression       GI/Hepatic Neg liver ROS, hiatal hernia, PUD,GERD  Medicated and Controlled,,UC S/p gastric sleeve    Endo/Other  negative endocrine ROS    Renal/GU negative Renal ROS  negative genitourinary   Musculoskeletal  (+) Arthritis , Osteoarthritis,  Right knee OA   Abdominal   Peds  Hematology  (+) Blood dyscrasia, anemia   Anesthesia Other Findings On chronic narcotics   Reproductive/Obstetrics negative OB ROS S/p hysterectomy                              Anesthesia Physical Anesthesia Plan  ASA: 3  Anesthesia Plan: MAC   Post-op Pain Management: Minimal or no pain anticipated   Induction:   PONV Risk Score and Plan: 3 and Propofol infusion, Ondansetron and Midazolam  Airway Management Planned: Natural Airway and Simple Face Mask  Additional Equipment: None  Intra-op Plan:   Post-operative Plan:   Informed Consent: I have reviewed the patients History and Physical, chart, labs and discussed the procedure including the risks, benefits and alternatives for the proposed anesthesia with the patient or authorized representative who has indicated his/her understanding and acceptance.     Dental advisory given  Plan Discussed  with: Anesthesiologist, CRNA and Surgeon  Anesthesia Plan Comments:         Anesthesia Quick Evaluation

## 2023-01-20 NOTE — Progress Notes (Signed)
PT Cancellation Note  Patient Details Name: [name] MRN: [id number] DOB: 1976-09-20   Cancelled Treatment:    Reason Eval/Treat Not Completed: Patient at procedure or test/unavailable (pt leaving room for EGD. Will check back this afternoon.)   Tamala Ser PT 01/20/2023  Acute Rehabilitation Services  Office [phone]

## 2023-01-20 NOTE — Assessment & Plan Note (Signed)
Patient with history of migraine.  No acute concern - Continue Atogepant

## 2023-01-20 NOTE — Anesthesia Postprocedure Evaluation (Signed)
Anesthesia Post Note  Patient: [name]  Procedure(s) Performed: ESOPHAGOGASTRODUODENOSCOPY (EGD) WITH PROPOFOL BIOPSY     Patient location during evaluation: Endoscopy Anesthesia Type: MAC Level of consciousness: awake and alert Pain management: pain level controlled Vital Signs Assessment: post-procedure vital signs reviewed and stable Respiratory status: spontaneous breathing and respiratory function stable Cardiovascular status: stable Postop Assessment: no apparent nausea or vomiting Anesthetic complications: no   No notable events documented.  Last Vitals:  Vitals:   01/20/23 1337 01/20/23 1414  BP: 108/63 111/69  Pulse: 78 81  Resp: 20 18  Temp: 37 C 37.1 C  SpO2: 100% 100%    Last Pain:  Vitals:   01/20/23 1553  TempSrc:   PainSc: 3                  Jlyn Bracamonte DANIEL

## 2023-01-20 NOTE — Assessment & Plan Note (Addendum)
Dietitian consult -Multiple dietary labs pending

## 2023-01-20 NOTE — Op Note (Signed)
[facility] Patient Name: [name] Procedure Date: 01/20/2023 MRN: [id number] Attending MD: Kerin Salen , MD, [id number] Date of Birth: 13-Aug-1976 CSN: [id number] Age: 47 Admit Type: Inpatient Procedure:                Upper GI endoscopy Indications:              Epigastric abdominal pain, Acute post hemorrhagic                            anemia, Iron deficiency anemia secondary to chronic                            blood loss, Melena, Nausea with vomiting Providers:                Kerin Salen, MD, Lorenza Evangelist, RN, Kandice Robinsons, Technician Referring MD:             [facility] Medicines:                See the Anesthesia note for documentation of the                            administered medications Complications:            No immediate complications. Estimated Blood Loss:     Estimated blood loss was minimal. Procedure:                Pre-Anesthesia Assessment:                           - Prior to the procedure, a History and Physical                            was performed, and patient medications and                            allergies were reviewed. The patient's tolerance of                            previous anesthesia was also reviewed. The risks                            and benefits of the procedure and the sedation                            options and risks were discussed with the patient.                            All questions were answered, and informed consent                            was obtained. Prior Anticoagulants: The patient has  taken no anticoagulant or antiplatelet agents. ASA                            Grade Assessment: III - A patient with severe                            systemic disease. After reviewing the risks and                            benefits, the patient was deemed in satisfactory                            condition to undergo the procedure.                            After obtaining informed consent, the endoscope was                            passed under direct vision. Throughout the                            procedure, the patient's blood pressure, pulse, and                            oxygen saturations were monitored continuously. The                            [id number]-[id number] ([id number]) Olympus endoscope was introduced                            through the mouth, and advanced to the second part                            of duodenum. The upper GI endoscopy was                            accomplished without difficulty. The patient                            tolerated the procedure well. Scope In: Scope Out: Findings:      Patchy, white plaques were found in the middle third of the esophagus       and in the lower third of the esophagus. Biopsies were taken with a cold       forceps for histology.      The Z-line was regular and was found 35 cm from the incisors.      Evidence of a gastric bypass was found. A gastric pouch with a 4 cm       length from the GE junction to the gastrojejunal anastomosis was found       containing ulceration.      Three non-bleeding cratered gastric ulcers with a clean ulcer base       (Forrest Class III) were found in the stomach at area of anastomosis.       The largest lesion was 20 mm in  largest dimension. Biopsies were taken       with a cold forceps for Helicobacter pylori testing.      Many non-bleeding superficial ulcers with a clean ulcer base (Forrest       Class III) were found in the jejunum. The largest lesion was 5 mm in       largest dimension. Impression:               - Esophageal plaques were found, suspicious for                            candidiasis. Biopsied.                           - Z-line regular, 35 cm from the incisors.                           - Gastric bypass with a pouch 4 cm in length.                           - Non-bleeding gastric ulcers with a clean ulcer                             base (Forrest Class III). Biopsied.                           - Non-bleeding jejunal ulcers with a clean ulcer                            base (Forrest Class III). Moderate Sedation:      Patient did not receive moderate sedation for this procedure, but       instead received monitored anesthesia care. Recommendation:           - Advance diet as tolerated.                           - Await pathology results.                           - PPI BID indefinitely.                           - Sucralfate QID for 2 months.                           - No aspirin, ibuprofen, naproxen, or other                            non-steroidal anti-inflammatory drugs. Procedure Code(s):        --- Professional ---                           [phone], Esophagogastroduodenoscopy, flexible,                            transoral; with biopsy, single or multiple Diagnosis Code(s):        --- Professional ---  K22.9, Disease of esophagus, unspecified                           Z98.84, Bariatric surgery status                           K25.9, Gastric ulcer, unspecified as acute or                            chronic, without hemorrhage or perforation                           K28.9, Gastrojejunal ulcer, unspecified as acute or                            chronic, without hemorrhage or perforation                           R10.13, Epigastric pain                           D62, Acute posthemorrhagic anemia                           D50.0, Iron deficiency anemia secondary to blood                            loss (chronic)                           K92.1, Melena (includes Hematochezia)                           R11.2, Nausea with vomiting, unspecified CPT copyright 2022 American [employer] Association. All rights reserved. The codes documented in this report are preliminary and upon coder review may  be revised to meet current compliance requirements. Kerin Salen, MD 01/20/2023  12:52:10 PM This report has been signed electronically. Number of Addenda: 0

## 2023-01-21 DIAGNOSIS — R1010 Upper abdominal pain, unspecified: Secondary | ICD-10-CM | POA: Diagnosis not present

## 2023-01-21 DIAGNOSIS — R109 Unspecified abdominal pain: Secondary | ICD-10-CM | POA: Diagnosis not present

## 2023-01-21 DIAGNOSIS — K922 Gastrointestinal hemorrhage, unspecified: Secondary | ICD-10-CM | POA: Diagnosis not present

## 2023-01-21 DIAGNOSIS — E538 Deficiency of other specified B group vitamins: Secondary | ICD-10-CM | POA: Diagnosis not present

## 2023-01-21 LAB — TYPE AND SCREEN: Unit division: 0

## 2023-01-21 LAB — BASIC METABOLIC PANEL
Anion gap: 8 (ref 5–15)
BUN: 8 mg/dL (ref 6–20)
CO2: 24 mmol/L (ref 22–32)
Calcium: 8.2 mg/dL — ABNORMAL LOW (ref 8.9–10.3)
Chloride: 105 mmol/L (ref 98–111)
Creatinine, Ser: 0.77 mg/dL (ref 0.44–1.00)
GFR, Estimated: >60 mL/min (ref >60)
Glucose, Bld: 79 mg/dL (ref 70–99)
Potassium: 4.1 mmol/L (ref 3.5–5.1)
Sodium: 137 mmol/L (ref 135–145)

## 2023-01-21 LAB — CBC
HCT: 25.7 % — ABNORMAL LOW (ref 36.0–46.0)
Hemoglobin: 8.3 g/dL — ABNORMAL LOW (ref 12.0–15.0)
MCH: 31.7 pg (ref 26.0–34.0)
MCHC: 32.3 g/dL (ref 30.0–36.0)
MCV: 98.1 fL (ref 80.0–100.0)
Platelets: 248 10*3/uL (ref 150–400)
RBC: 2.62 MIL/uL — ABNORMAL LOW (ref 3.87–5.11)
RDW: 15 % (ref 11.5–15.5)
WBC: 5.3 10*3/uL (ref 4.0–10.5)
nRBC: 0 % (ref 0.0–0.2)

## 2023-01-21 LAB — SURGICAL PATHOLOGY

## 2023-01-21 LAB — BPAM RBC: Blood Product Expiration Date: 202405182359

## 2023-01-21 MED ORDER — SUCRALFATE 1 G PO TABS: 1.0000 g | ORAL_TABLET | Freq: Four times a day (QID) | ORAL | 1 refills | Status: DC

## 2023-01-21 MED ORDER — LINACLOTIDE 145 MCG PO CAPS
290.0000 ug | ORAL_CAPSULE | Freq: Every day | ORAL | Status: DC
  Administered 2023-01-21: 290 ug via ORAL
  Filled 2023-01-21: qty 2

## 2023-01-21 NOTE — Progress Notes (Signed)
Subjective: Discussed about EGD findings, cratered but clean-based gastrojejunal/anastomotic ulcers and multiple superficial jejunal ulcers likely causing black stools. She has not had a bowel movement in almost a week. Complains of mild generalized abdominal pain.  Objective: Vital signs in last 24 hours: Temp:  [97.8 F (36.6 C)-99.8 F (37.7 C)] 99.1 F (37.3 C) (04/25 0441) Pulse Rate:  [78-99] 87 (04/25 0441) Resp:  [14-27] 16 (04/25 0441) BP: (96-128)/(54-77) 106/67 (04/25 0441) SpO2:  [95 %-100 %] 100 % (04/25 0441) Weight:  [82.4 kg] 82.4 kg (04/25 0441) Weight change: 3.4 kg Last BM Date : 01/12/23  PE: Mild pallor GENERAL: Alert, awake, oriented x 3  ABDOMEN: Soft, mild generalized tenderness, normoactive bowel sounds EXTREMITIES: No deformity  Lab Results: Results for orders placed or performed during the hospital encounter of 01/17/23 (from the past 48 hour(s))  CBC     Status: Abnormal   Collection Time: 01/19/23 10:39 AM  Result Value Ref Range   WBC 7.8 4.0 - 10.5 K/uL   RBC 2.51 (L) 3.87 - 5.11 MIL/uL   Hemoglobin 7.8 (L) 12.0 - 15.0 g/dL   HCT 16.1 (L) 09.6 - 04.5 %   MCV 98.4 80.0 - 100.0 fL   MCH 31.1 26.0 - 34.0 pg   MCHC 31.6 30.0 - 36.0 g/dL   RDW 40.9 81.1 - 91.4 %   Platelets 243 150 - 400 K/uL   nRBC 0.0 0.0 - 0.2 %    Comment: Performed at [facility], [address], [place], [place] [address]  Comprehensive metabolic panel     Status: Abnormal   Collection Time: 01/20/23  4:25 AM  Result Value Ref Range   Sodium 136 135 - 145 mmol/L   Potassium 3.9 3.5 - 5.1 mmol/L   Chloride 106 98 - 111 mmol/L   CO2 26 22 - 32 mmol/L   Glucose, Bld 88 70 - 99 mg/dL    Comment: Glucose reference range applies only to samples taken after fasting for at least 8 hours.   BUN 7 6 - 20 mg/dL   Creatinine, Ser 6.21 0.44 - 1.00 mg/dL   Calcium 8.4 (L) 8.9 - 10.3 mg/dL   Total Protein 5.3 (L) 6.5 - 8.1 g/dL   Albumin 2.5 (L) 3.5 - 5.0 g/dL    AST 12 (L) 15 - 41 U/L   ALT 8 0 - 44 U/L   Alkaline Phosphatase 36 (L) 38 - 126 U/L   Total Bilirubin 0.3 0.3 - 1.2 mg/dL   GFR, Estimated >30 >86 mL/min    Comment: (NOTE) Calculated using the CKD-EPI Creatinine Equation (2021)    Anion gap 4 (L) 5 - 15    Comment: Performed at [facility], [address], [place], [place] [address]  CBC     Status: Abnormal   Collection Time: 01/20/23  4:25 AM  Result Value Ref Range   WBC 5.7 4.0 - 10.5 K/uL   RBC 2.16 (L) 3.87 - 5.11 MIL/uL   Hemoglobin 6.7 (LL) 12.0 - 15.0 g/dL    Comment: REPEATED TO VERIFY THIS CRITICAL RESULT HAS VERIFIED AND BEEN CALLED TO L. BRADSHAW RN BY ATCHISON,MARY ON 04 24 2024 AT 0457, AND HAS BEEN READ BACK.     HCT 21.2 (L) 36.0 - 46.0 %   MCV 98.1 80.0 - 100.0 fL   MCH 31.0 26.0 - 34.0 pg   MCHC 31.6 30.0 - 36.0 g/dL   RDW 96.2 95.2 - 84.1 %  Platelets 231 150 - 400 K/uL   nRBC 0.0 0.0 - 0.2 %    Comment: Performed at [facility], [address] [place], [place] [address]  Type and screen [facility]     Status: None (Preliminary result)   Collection Time: 01/20/23  6:52 AM  Result Value Ref Range   ABO/RH(D) O POS    Antibody Screen NEG    Sample Expiration 01/23/2023,2359    Unit Number [id number]    Blood Component Type RED CELLS,LR    Unit division 00    Status of Unit ISSUED    Transfusion Status OK TO TRANSFUSE    Crossmatch Result      Compatible Performed at [facility], [address] [place], [place] [address]    Unit Number [id number]    Blood Component Type RED CELLS,LR    Unit division 00    Status of Unit ALLOCATED    Transfusion Status OK TO TRANSFUSE    Crossmatch Result Compatible   Prepare RBC (crossmatch)     Status: None   Collection Time: 01/20/23  6:52 AM  Result Value Ref Range   Order Confirmation      ORDER PROCESSED BY BLOOD BANK Performed at [facility], [address], [place], [place] [address]   Hemoglobin and hematocrit, blood     Status: Abnormal   Collection Time: 01/20/23  4:26 PM  Result Value Ref Range   Hemoglobin 8.9 (L) 12.0 - 15.0 g/dL    Comment: REPEATED TO VERIFY POST TRANSFUSION SPECIMEN    HCT 27.5 (L) 36.0 - 46.0 %    Comment: Performed at [facility], [address] [place], [place] [address]  Vitamin B12     Status: None   Collection Time: 01/20/23  4:26 PM  Result Value Ref Range   Vitamin B-12 476 180 - 914 pg/mL    Comment: (NOTE) This assay is not validated for testing neonatal or myeloproliferative syndrome specimens for Vitamin B12 levels. Performed at [facility], [address], [place], [place] [address]   Folate     Status: None   Collection Time: 01/20/23  4:26 PM  Result Value Ref Range   Folate 7.2 >5.9 ng/mL    Comment: Performed at [facility], [address], [place], [place] [address]  Iron and TIBC     Status: Abnormal   Collection Time: 01/20/23  4:26 PM  Result Value Ref Range   Iron 72 28 - 170 ug/dL   TIBC 259 (L) 563 - 875 ug/dL   Saturation Ratios 38 (H) 10.4 - 31.8 %   UIBC 118 ug/dL    Comment: Performed at [facility], [address], [place], [place] [address]  Ferritin     Status: None   Collection Time: 01/20/23  4:26 PM  Result Value Ref Range   Ferritin 85 11 - 307 ng/mL    Comment: Performed at [facility], [address] [place], [place] [address]  Reticulocytes     Status: Abnormal   Collection Time: 01/20/23  4:26 PM  Result Value Ref Range   Retic Ct Pct 3.5 (H) 0.4 - 3.1 %   RBC. 2.85 (L) 3.87 - 5.11 MIL/uL   Retic Count, Absolute 99.8 19.0 - 186.0 K/uL   Immature Retic Fract 31.8 (H) 2.3 - 15.9 %    Comment: Performed at [facility],  [address], [place], [place] [address]  Glucose, capillary     Status: Abnormal   Collection Time: 01/20/23   8:01 PM  Result Value Ref Range   Glucose-Capillary 100 (H) 70 - 99 mg/dL    Comment: Glucose reference range applies only to samples taken after fasting for at least 8 hours.  CBC     Status: Abnormal   Collection Time: 01/21/23  4:05 AM  Result Value Ref Range   WBC 5.3 4.0 - 10.5 K/uL   RBC 2.62 (L) 3.87 - 5.11 MIL/uL   Hemoglobin 8.3 (L) 12.0 - 15.0 g/dL   HCT 19.1 (L) 47.8 - 29.5 %   MCV 98.1 80.0 - 100.0 fL   MCH 31.7 26.0 - 34.0 pg   MCHC 32.3 30.0 - 36.0 g/dL   RDW 62.1 30.8 - 65.7 %   Platelets 248 150 - 400 K/uL   nRBC 0.0 0.0 - 0.2 %    Comment: Performed at [facility], [address], [place], [place] [address]  Basic metabolic panel     Status: Abnormal   Collection Time: 01/21/23  4:05 AM  Result Value Ref Range   Sodium 137 135 - 145 mmol/L   Potassium 4.1 3.5 - 5.1 mmol/L   Chloride 105 98 - 111 mmol/L   CO2 24 22 - 32 mmol/L   Glucose, Bld 79 70 - 99 mg/dL    Comment: Glucose reference range applies only to samples taken after fasting for at least 8 hours.   BUN 8 6 - 20 mg/dL   Creatinine, Ser 2.95 0.44 - 1.00 mg/dL   Calcium 8.2 (L) 8.9 - 10.3 mg/dL   GFR, Estimated >28 >41 mL/min    Comment: (NOTE) Calculated using the CKD-EPI Creatinine Equation (2021)    Anion gap 8 5 - 15    Comment: Performed at [facility], [address] [place], [place] [address]    Studies/Results: DG Knee 1-2 Views Left  Result Date: 01/19/2023 CLINICAL DATA:  Bilateral knee pain after falling. EXAM: LEFT KNEE - 1-2 VIEW; RIGHT KNEE - 1-2 VIEW COMPARISON:  Right knee radiographs 08/23/2012. FINDINGS: Status post bilateral total knee arthroplasty. The hardware is intact without loosening. No evidence of acute fracture or dislocation. No significant joint effusion or focal soft tissue abnormality identified. There is some calcification anterior to the distal left femur which may be associated with the quadriceps tendon. No surrounding soft  tissue swelling to suggest acute quadriceps injury; correlate clinically. IMPRESSION: 1. No evidence of acute fracture or dislocation in either knee. 2. Intact bilateral total knee arthroplasties. 3. Nonspecific calcification anterior to the distal left femur as described. Electronically Signed   By: Carey Bullocks M.D.   On: 01/19/2023 14:04   DG Knee 1-2 Views Right  Result Date: 01/19/2023 CLINICAL DATA:  Bilateral knee pain after falling. EXAM: LEFT KNEE - 1-2 VIEW; RIGHT KNEE - 1-2 VIEW COMPARISON:  Right knee radiographs 08/23/2012. FINDINGS: Status post bilateral total knee arthroplasty. The hardware is intact without loosening. No evidence of acute fracture or dislocation. No significant joint effusion or focal soft tissue abnormality identified. There is some calcification anterior to the distal left femur which may be associated with the quadriceps tendon. No surrounding soft tissue swelling to suggest acute quadriceps injury; correlate clinically. IMPRESSION: 1. No evidence of acute fracture or dislocation in either knee. 2. Intact bilateral total knee arthroplasties. 3. Nonspecific calcification anterior to the distal left femur as described.  Electronically Signed   By: Carey Bullocks M.D.   On: 01/19/2023 14:04    Medications: I have reviewed the patient's current medications.  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 Patient will need PPI twice daily indefinitely and sucralfate 4 times a day at least for the next 4 weeks. Will follow biopsies of stomach for H. pylori and biopsies of esophagus for Candida as an outpatient. Will resume  Linzess 290 mcg while in the hospital and advised patient to continue Linzess and Symproic as an outpatient for chronic idiopathic constipation. Continue mesalamine 2.4 g daily for ulcerative colitis. Patient to follow-up with her primary GI at [facility] on discharge. GI will sign off, please recall if needed. Okay to DC home from GI standpoint.  Kerin Salen, MD 01/21/2023, 7:57 AM

## 2023-01-21 NOTE — Telephone Encounter (Signed)
Received fax from [facility] that they have still been unable to reach pt to fill Botox. Pt is currently hospitalized. If she calls to r/s Botox, she first needs to contact [facility] at [phone].

## 2023-01-21 NOTE — Plan of Care (Signed)

## 2023-01-21 NOTE — Discharge Summary (Signed)
Physician Discharge Summary   Patient: [name] MRN: [id number] DOB: [birth year]  Admit date:     01/17/2023  Discharge date: 01/21/23  Discharge Physician: Arnetha Courser   PCP: Felix Pacini, FNP   Recommendations at discharge:  Please obtain CBC and BMP within a week Follow-up with gastroenterology within a week Follow-up with primary care provider  Discharge Diagnoses: Principal Problem:   Intractable abdominal pain Active Problems:   Upper GI bleeding   B12 deficiency   Normocytic anemia   Pain of upper abdomen   Left sided chronic colitis - segmental   Chronic headache disorder   MDD (major depressive disorder), recurrent severe, without psychosis   Malnutrition of moderate degree   Hospital Course: 47 y.o. female with medical history significant for depression, anxiety, chronic migraine, ulcerative colitis, and history of gastric bypass who presents with upper abdominal pain, nausea, and vomiting.   Patient reports developing upper abdominal pain with nausea and vomiting on 01/12/2023.  Since then, pain has worsened and become more generalized.  She denies any fever, chills, or diarrhea associated with this.  She has been unable to tolerate even liquids at home unable to control her pain, and came to the ED for evaluation.   ED Course: Upon arrival to the ED, patient is found to be afebrile and saturating well on room air with mild tachycardia and stable blood pressure.  EKG reveals sinus tachycardia with PVC.  CT of the abdomen and pelvis is notable for prior gastric bypass and thick-walled appearance to a small bowel loop extending from the stomach anastomosis.  Labs are notable for normal LFTs, normal lipase, and WBC 15,400.   Patient was treated with IV fluids, 2 doses of fentanyl, Dilaudid, and Zofran in the ED.  4/24: Hemodynamically stable.  Slowly drifting down hemoglobin at 6.7 today.  1 unit of PRBC ordered.  EGD today with jejunal erosions and ulcers  involving anastomosis.  Biopsies were taken.  GI is recommending continuation of twice daily PPI and sucralfate and they will follow-up as outpatient.  4/25: Hemoglobin seems stable at 8.3 this morning.  Her Carafate dose was increased to every 6 hourly by GI and he will continue taking twice daily Protonix indefinitely.  GI will follow-up on the biopsy results and for further management as outpatient.  Patient was instructed to avoid constipation and continue taking her home medications as she was taking it before.  She should also avoid NSAID.  Patient need to follow-up with her providers closely for further recommendations.  Assessment and Plan: * Intractable abdominal pain Improved.CT notable for wall-thickening involving jejunal loop extending from gastrojejunal anastomosis. EGD with erosions and ulcers involving anastomosis and jejunum. -Continue with supportive care   Upper GI bleeding Drifting down hemoglobin.  Patient with history of melena last week. EGD with ulceration of anastomosis and jejunum.  Biopsies were taken. -PPI twice daily -Sacral fate -Outpatient GI follow-up -Advance diet as tolerated  Normocytic anemia Most likely secondary to GI bleed.  Also has an history of iron and B12 deficiency with gastric bypass surgery.  Hemoglobin at 6.7 today. -1 unit of PRBC -Monitoring hemoglobin -Transfuse if below 7 -Continue home iron and B12 supplement -Check anemia panel  B12 deficiency B12 at 195-MMA pending -Continue home B12 supplement, she is on twice weekly IM injections  Malnutrition of moderate degree Dietitian consult -Multiple dietary labs pending  MDD (major depressive disorder), recurrent severe, without psychosis - Continue Cymbalta, Wellbutrin, Buspar, Remeron, and as-needed Ativan  Chronic headache disorder Patient with history of migraine.  No acute concern - Continue Atogepant   Left sided chronic colitis - segmental Patient has an history of  ulcerative colitis.  Current CT with unremarkable large bowel. -Continue home mesalamine      Pain control - [place] Controlled Substance Reporting System database was reviewed. and patient was instructed, not to drive, operate heavy machinery, perform activities at heights, swimming or participation in water activities or provide baby-sitting services while on Pain, Sleep and Anxiety Medications; until their outpatient Physician has advised to do so again. Also recommended to not to take more than prescribed Pain, Sleep and Anxiety Medications.  Consultants: Gastroenterology Procedures performed: EGD Disposition: Home Diet recommendation:  Discharge Diet Orders (From admission, onward)     Start     Ordered   01/21/23 0000  Diet - low sodium heart healthy        01/21/23 1140           Regular diet DISCHARGE MEDICATION: Allergies as of 01/21/2023       Reactions   Coconut (cocos Nucifera) Anaphylaxis, Itching   Coconut Oil Anaphylaxis, Itching, Other (See Comments)   Morphine And Related Anaphylaxis, Hives   Pt states that she has tolerated Norco and Dilaudid   Oxycodone Anaphylaxis   Oxycodone-acetaminophen Anaphylaxis   Ciprofloxacin Itching, Rash   Penicillamine Hives, Other (See Comments)   Trazodone And Nefazodone Rash   Vistaril [hydroxyzine Hcl] Hives   Doxycycline Nausea And Vomiting   Silicone    Penicillins Rash   Has patient had a PCN reaction causing immediate rash, facial/tongue/throat swelling, SOB or lightheadedness with hypotension: Yes Has patient had a PCN reaction causing severe rash involving mucus membranes or skin necrosis: No Has patient had a PCN reaction that required hospitalization No Has patient had a PCN reaction occurring within the last 10 years: No If all of the above answers are "NO", then may proceed with Cephalosporin use.   Tape Rash   And paper tape causes a rash if wearing for a prolong period of time        Medication  List     STOP taking these medications    celecoxib 200 MG capsule Commonly known as: CELEBREX   HYDROcodone-acetaminophen 5-325 MG tablet Commonly known as: NORCO/VICODIN   ibuprofen 600 MG tablet Commonly known as: ADVIL   metoCLOPramide 10 MG tablet Commonly known as: REGLAN       TAKE these medications    albuterol 108 (90 Base) MCG/ACT inhaler Commonly known as: VENTOLIN HFA Inhale 2 puffs into the lungs every 6 (six) hours as needed for wheezing or shortness of breath.   atorvastatin 40 MG tablet Commonly known as: LIPITOR Take 40 mg by mouth daily.   baclofen 10 MG tablet Commonly known as: LIORESAL TAKE 1 TABLET BY MOUTH EVERYDAY AT BEDTIME What changed:  how much to take how to take this when to take this additional instructions   Botox 200 units injection Generic drug: botulinum toxin Type A INJECT 155 UNITS INTRAMUSCULARLY INTO HEAD AND NECK MUSCLES EVERY 3 MONTHS What changed: Another medication with the same name was removed. Continue taking this medication, and follow the directions you see here.   buPROPion 150 MG 24 hr tablet Commonly known as: WELLBUTRIN XL Take 150 mg by mouth every morning. Take along with 300 mg=450 mg   buPROPion 300 MG 24 hr tablet Commonly known as: WELLBUTRIN XL Take 300 mg by mouth every morning. Take  along with 150 mg=450 mg   busPIRone 15 MG tablet Commonly known as: BUSPAR Take 15 mg by mouth 3 (three) times daily.   cyanocobalamin 1000 MCG/ML injection Commonly known as: VITAMIN B12 Inject 1,000 mcg into the muscle 2 (two) times a week.   diclofenac Sodium 1 % Gel Commonly known as: VOLTAREN Apply topically 4 (four) times daily.   dicyclomine 10 MG capsule Commonly known as: BENTYL Take 10 mg by mouth 2 (two) times daily as needed.   DULoxetine 60 MG capsule Commonly known as: CYMBALTA Take 1 capsule (60 mg total) by mouth 2 (two) times daily.   EPINEPHrine 0.3 mg/0.3 mL Soaj injection Commonly  known as: EPI-PEN Inject 0.3 mg into the muscle as needed for anaphylaxis.   ferrous sulfate 325 (65 FE) MG tablet Take 1 tablet (325 mg total) by mouth 2 (two) times daily with a meal. What changed: when to take this   fluticasone 50 MCG/ACT nasal spray Commonly known as: FLONASE Place 2 sprays into both nostrils daily as needed for allergies or rhinitis.   gabapentin 400 MG capsule Commonly known as: NEURONTIN Take 400 mg by mouth 3 (three) times daily.   hydrocortisone 2.5 % rectal cream Commonly known as: ANUSOL-HC Place rectally.   HYDROmorphone 4 MG tablet Commonly known as: DILAUDID Take 1 tablet (4 mg total) by mouth every 3 (three) hours. What changed: Another medication with the same name was removed. Continue taking this medication, and follow the directions you see here.   linaclotide 290 MCG Caps capsule Commonly known as: LINZESS Take 290 mcg by mouth daily before breakfast.   LORazepam 1 MG tablet Commonly known as: ATIVAN Take 1 mg by mouth 2 (two) times daily as needed for anxiety.   magnesium oxide 400 (240 Mg) MG tablet Commonly known as: MAG-OX Take 1 tablet by mouth daily.   meclizine 25 MG tablet Commonly known as: ANTIVERT Take 25 mg by mouth 3 (three) times daily.   mesalamine 1.2 g EC tablet Commonly known as: LIALDA Take 2.4 g by mouth daily with breakfast.   mirtazapine 15 MG tablet Commonly known as: REMERON Take 15 mg by mouth at bedtime as needed (sleep).   multivitamin with minerals Tabs tablet Take 1 tablet by mouth daily.   Narcan 4 MG/0.1ML Liqd nasal spray kit Generic drug: naloxone Place 1 spray into the nose as directed.   ondansetron 8 MG tablet Commonly known as: ZOFRAN Take 8 mg by mouth every 8 (eight) hours as needed for nausea or vomiting.   oxybutynin 5 MG tablet Commonly known as: DITROPAN Take 5 mg by mouth daily.   Ozempic (2 MG/DOSE) 8 MG/3ML Sopn Generic drug: Semaglutide (2 MG/DOSE) Inject 2 mg into the  skin once a week.   pantoprazole 40 MG tablet Commonly known as: PROTONIX TAKE 1 TABLET BY MOUTH TWICE A DAY BEFORE A MEAL   phentermine 37.5 MG tablet Commonly known as: ADIPEX-P Take 37.5 mg by mouth daily before breakfast.   Qulipta 30 MG Tabs Generic drug: Atogepant Take 1 tablet (30 mg total) by mouth daily.   sucralfate 1 g tablet Commonly known as: CARAFATE Take 1 tablet (1 g total) by mouth every 6 (six) hours. What changed: when to take this   SUMAtriptan 50 MG tablet Commonly known as: Imitrex Take 1 tablet (50 mg total) by mouth every 2 (two) hours as needed for migraine. May repeat in 2 hours if headache persists or recurs.   Symproic 0.2 MG  Tabs Generic drug: Naldemedine Tosylate Take 1 tablet by mouth daily.   thiamine 100 MG tablet Commonly known as: Vitamin B-1 Take 100 mg by mouth daily at 12 noon.   torsemide 10 MG tablet Commonly known as: DEMADEX Take 10 mg by mouth daily.   Vitamin D3 1.25 MG (50000 UT) Caps Take 50,000 Units by mouth every 7 (seven) days.        Follow-up Information     Felix Pacini, FNP. Schedule an appointment as soon as possible for a visit in 1 week(s).   Specialty: Endocrinology Contact information: [address] [place] [address] [phone]         Kerin Salen, MD. Schedule an appointment as soon as possible for a visit in 1 week(s).   Specialty: Gastroenterology Contact information: [address] [place] [address] [place] [address] [phone]                Discharge Exam: Ceasar Mons Weights   01/19/23 0500 01/20/23 0500 01/21/23 0441  Weight: 73.3 kg 79 kg 82.4 kg   General.     In no acute distress. Pulmonary.  Lungs clear bilaterally, normal respiratory effort. CV.  Regular rate and rhythm, no JVD, rub or murmur. Abdomen.  Soft, nontender, nondistended, BS positive. CNS.  Alert and oriented .  No focal neurologic deficit. Extremities.  No edema, no cyanosis, pulses intact and  symmetrical. Psychiatry.  Judgment and insight appears normal.   Condition at discharge: stable  The results of significant diagnostics from this hospitalization (including imaging, microbiology, ancillary and laboratory) are listed below for reference.   Imaging Studies: DG Knee 1-2 Views Left  Result Date: 01/19/2023 CLINICAL DATA:  Bilateral knee pain after falling. EXAM: LEFT KNEE - 1-2 VIEW; RIGHT KNEE - 1-2 VIEW COMPARISON:  Right knee radiographs 08/23/2012. FINDINGS: Status post bilateral total knee arthroplasty. The hardware is intact without loosening. No evidence of acute fracture or dislocation. No significant joint effusion or focal soft tissue abnormality identified. There is some calcification anterior to the distal left femur which may be associated with the quadriceps tendon. No surrounding soft tissue swelling to suggest acute quadriceps injury; correlate clinically. IMPRESSION: 1. No evidence of acute fracture or dislocation in either knee. 2. Intact bilateral total knee arthroplasties. 3. Nonspecific calcification anterior to the distal left femur as described. Electronically Signed   By: Carey Bullocks M.D.   On: 01/19/2023 14:04   DG Knee 1-2 Views Right  Result Date: 01/19/2023 CLINICAL DATA:  Bilateral knee pain after falling. EXAM: LEFT KNEE - 1-2 VIEW; RIGHT KNEE - 1-2 VIEW COMPARISON:  Right knee radiographs 08/23/2012. FINDINGS: Status post bilateral total knee arthroplasty. The hardware is intact without loosening. No evidence of acute fracture or dislocation. No significant joint effusion or focal soft tissue abnormality identified. There is some calcification anterior to the distal left femur which may be associated with the quadriceps tendon. No surrounding soft tissue swelling to suggest acute quadriceps injury; correlate clinically. IMPRESSION: 1. No evidence of acute fracture or dislocation in either knee. 2. Intact bilateral total knee arthroplasties. 3. Nonspecific  calcification anterior to the distal left femur as described. Electronically Signed   By: Carey Bullocks M.D.   On: 01/19/2023 14:04   CT HEAD WO CONTRAST ( )  Result Date: 01/18/2023 CLINICAL DATA:  Poly trauma EXAM: CT HEAD WITHOUT CONTRAST TECHNIQUE: Contiguous axial images were obtained from the base of the skull through the vertex without intravenous contrast. RADIATION DOSE REDUCTION: This exam  was performed according to the departmental dose-optimization program which includes automated exposure control, adjustment of the mA and/or kV according to patient size and/or use of iterative reconstruction technique. COMPARISON:  11/27/2020 CT head, correlation is made with MRI head 07/12/2022 FINDINGS: Brain: No evidence of acute infarction, hemorrhage, mass, mass effect, or midline shift. No hydrocephalus or extra-axial fluid collection. Vascular: No hyperdense vessel. Skull: Negative for fracture or focal lesion. Sinuses/Orbits: No acute finding. Other: The mastoid air cells are well aerated. IMPRESSION: No acute intracranial process. Electronically Signed   By: Wiliam Ke M.D.   On: 01/18/2023 16:15   CT ABDOMEN PELVIS W CONTRAST  Result Date: 01/17/2023 CLINICAL DATA:  Bowel obstruction suspected EXAM: CT ABDOMEN AND PELVIS WITH CONTRAST TECHNIQUE: Multidetector CT imaging of the abdomen and pelvis was performed using the standard protocol following bolus administration of intravenous contrast. RADIATION DOSE REDUCTION: This exam was performed according to the departmental dose-optimization program which includes automated exposure control, adjustment of the mA and/or kV according to patient size and/or use of iterative reconstruction technique. CONTRAST:  OMNIPAQUE IOHEXOL 300 MG/ML  SOLN COMPARISON:  09/28/2021 FINDINGS: Lower chest: No acute abnormality Hepatobiliary: No focal hepatic abnormality. Gallbladder unremarkable. Pancreas: No focal abnormality or ductal dilatation. Spleen: 2 cm  low-density lesion in the spleen, stable since prior study, likely cyst. Normal size. Adrenals/Urinary Tract: No focal hepatic abnormality. Gallbladder unremarkable. Stomach/Bowel: Normal appendix. Prior gastric bypass. There is mild wall thickening within the jejunal loop of small bowel extending from the gastrojejunostomy. This may reflect enteritis. No evidence of bowel obstruction. Normal appendix. Large bowel unremarkable. Vascular/Lymphatic: No evidence of aneurysm or adenopathy. Reproductive: Prior hysterectomy.  No adnexal masses. Other: No free fluid or free air. Musculoskeletal: No acute bony abnormality. IMPRESSION: Prior gastric bypass. The small bowel loop extending from the stomach anastomosis appears thick walled. This may reflect enteritis. Remainder of the small bowel is unremarkable. No bowel obstruction. Electronically Signed   By: Charlett Nose M.D.   On: 01/17/2023 18:09    Microbiology: Results for orders placed or performed during the hospital encounter of 01/19/20  SARS CORONAVIRUS 2 (TAT 6-24 HRS) Nasopharyngeal Nasopharyngeal Swab     Status: None   Collection Time: 01/19/20  1:43 PM   Specimen: Nasopharyngeal Swab  Result Value Ref Range Status   SARS Coronavirus 2 NEGATIVE NEGATIVE Final    Comment: (NOTE) SARS-CoV-2 target nucleic acids are NOT DETECTED. The SARS-CoV-2 RNA is generally detectable in upper and lower respiratory specimens during the acute phase of infection. Negative results do not preclude SARS-CoV-2 infection, do not rule out co-infections with other pathogens, and should not be used as the sole basis for treatment or other patient management decisions. Negative results must be combined with clinical observations, patient history, and epidemiological information. The expected result is Negative. Fact Sheet for Patients: HairSlick.no Fact Sheet for Healthcare Providers: quierodirigir.com This test  is not yet approved or cleared by the [place] FDA and  has been authorized for detection and/or diagnosis of SARS-CoV-2 by FDA under an Emergency Use Authorization (EUA). This EUA will remain  in effect (meaning this test can be used) for the duration of the COVID-19 declaration under Section 56 4(b)(1) of the Act, 21 U.S.C. section 360bbb-3(b)(1), unless the authorization is terminated or revoked sooner. Performed at [facility], [address], [place], [place] [address]     Labs: CBC: Recent Labs  Lab 01/17/23 1634 01/17/23 1723 01/18/23 0349 01/19/23 1039 01/20/23 0425 01/20/23  1626 01/21/23 0405  WBC 15.4*  --  12.2* 7.8 5.7  --  5.3  HGB 11.5*   < > 9.0* 7.8* 6.7* 8.9* 8.3*  HCT 34.7*   < > 27.3* 24.7* 21.2* 27.5* 25.7*  MCV 93.5  --  96.5 98.4 98.1  --  98.1  PLT 339  --  256 243 231  --  248   < > = values in this interval not displayed.   Basic Metabolic Panel: Recent Labs  Lab 01/17/23 1634 01/17/23 1723 01/18/23 0349 01/19/23 0439 01/20/23 0425 01/21/23 0405  NA 135 135 138 137 136 137  K 4.2 3.7 3.4* 3.8 3.9 4.1  CL 93* 96* 103 104 106 105  CO2 28  --  GLUCOSE 149* 124* 111* 86 88 79  BUN 22* 22* CREATININE 1.10* 0.70 0.74 0.68 0.90 0.77  CALCIUM 9.5  --  8.8* 8.3* 8.4* 8.2*  MG  --   --  2.0  --   --   --    Liver Function Tests: Recent Labs  Lab 01/17/23 1634 01/20/23 0425  AST 24 12*  ALT 13 8  ALKPHOS 51 36*  BILITOT 0.8 0.3  PROT 8.2* 5.3*  ALBUMIN 4.1 2.5*   CBG: Recent Labs  Lab 01/20/23 2001  GLUCAP 100*    Discharge time spent: greater than 30 minutes.  This record has been created using Conservation officer, historic buildings. Errors have been sought and corrected,but may not always be located. Such creation errors do not reflect on the standard of care.   Signed: Arnetha Courser, MD [facility] 01/21/2023

## 2023-01-21 NOTE — Plan of Care (Signed)
  Problem: Education: Goal: Knowledge of General Education information will improve Description: Including pain rating scale, medication(s)/side effects and non-pharmacologic comfort measures Outcome: Progressing   Problem: Clinical Measurements: Goal: Ability to maintain clinical measurements within normal limits will improve Outcome: Progressing   Problem: Activity: Goal: Risk for activity intolerance will decrease Outcome: Progressing   Problem: Nutrition: Goal: Adequate nutrition will be maintained Outcome: Progressing   Problem: Coping: Goal: Level of anxiety will decrease Outcome: Progressing   Problem: Elimination: Goal: Will not experience complications related to bowel motility Outcome: Progressing   Problem: Pain Managment: Goal: General experience of comfort will improve Outcome: Progressing   Problem: Safety: Goal: Ability to remain free from injury will improve Outcome: Progressing   

## 2023-01-21 NOTE — TOC Progression Note (Signed)
Transition of Care [facility]) - Progression Note    Patient Details  Name: [name] MRN: [id number] Date of Birth: [birth year]  Transition of Care [facility]) CM/SW Contact  Huston Foley Jacklynn Ganong, RN Phone Number: 01/21/2023, 9:40 AM  Clinical Narrative:     Case manager was notified that [facility] could not provide service for patient at this time. Pt insurance declined by [employer] . CM contacted Physical therapist and was informed that patient will be fine with no Home Health. MD aware.    Expected Discharge Plan: Home w Home Health Services Barriers to Discharge: Continued Medical Work up  Expected Discharge Plan and Services   Discharge Planning Services: CM Consult Post Acute Care Choice: Home Health Living arrangements for the past 2 months: Apartment                 DME Arranged: N/A           HH Agency: [facility] Date [facility] Agency Contacted: 01/20/23 Time HH Agency Contacted: 1030 Representative spoke with at [facility] Agency: Lorenza Chick   Social Determinants of Health (SDOH) Interventions SDOH Screenings   Food Insecurity: No Food Insecurity (01/17/2023)  Housing: Low Risk  (01/17/2023)  Transportation Needs: No Transportation Needs (01/17/2023)  Utilities: Not At Risk (01/17/2023)  Alcohol Screen: Medium Risk (04/08/2019)  Depression (PHQ2-9): Low Risk  (01/05/2020)  Financial Resource Strain: Low Risk  (04/08/2019)  Physical Activity: Inactive (04/08/2019)  Social Connections: Socially Isolated (04/08/2019)  Stress: Stress Concern Present (04/08/2019)  Tobacco Use: Medium Risk (01/20/2023)    Readmission Risk Interventions     No data to display

## 2023-01-22 LAB — VITAMIN B6: Vitamin B6: 1.3 ug/L — ABNORMAL LOW (ref 3.4–65.2)

## 2023-01-22 LAB — ZINC: Zinc: 54 ug/dL (ref 44–115)

## 2023-01-24 ENCOUNTER — Encounter (HOSPITAL_COMMUNITY): Payer: Self-pay | Admitting: Gastroenterology

## 2023-01-24 LAB — TYPE AND SCREEN: Antibody Screen: NEGATIVE

## 2023-01-24 LAB — BPAM RBC
Blood Product Expiration Date: 202405182359
Unit Type and Rh: 5100
Unit Type and Rh: 5100

## 2023-01-24 LAB — VITAMIN E
Vitamin E (Alpha Tocopherol): 6.2 mg/L — ABNORMAL LOW (ref 7.0–25.1)
Vitamin E(Gamma Tocopherol): 0.8 mg/L (ref 0.5–5.5)

## 2023-01-24 LAB — VITAMIN K1, SERUM: VITAMIN K1: 0.12 ng/mL (ref 0.10–2.20)

## 2023-01-24 LAB — VITAMIN A: Vitamin A (Retinoic Acid): 22.4 ug/dL (ref 20.1–62.0)

## 2023-01-25 ENCOUNTER — Inpatient Hospital Stay (HOSPITAL_COMMUNITY)
Admission: EM | Admit: 2023-01-25 | Discharge: 2023-01-29 | DRG: 392 | Disposition: A | Discharge status: Home / Self Care | Payer: 59 | Attending: Internal Medicine | Admitting: Internal Medicine

## 2023-01-25 ENCOUNTER — Emergency Department (HOSPITAL_COMMUNITY): Payer: 59

## 2023-01-25 ENCOUNTER — Other Ambulatory Visit: Payer: Self-pay

## 2023-01-25 DIAGNOSIS — K5190 Ulcerative colitis, unspecified, without complications: Secondary | ICD-10-CM | POA: Diagnosis present

## 2023-01-25 DIAGNOSIS — D649 Anemia, unspecified: Secondary | ICD-10-CM | POA: Diagnosis present

## 2023-01-25 DIAGNOSIS — Z881 Allergy status to other antibiotic agents status: Secondary | ICD-10-CM

## 2023-01-25 DIAGNOSIS — Z79899 Other long term (current) drug therapy: Secondary | ICD-10-CM

## 2023-01-25 DIAGNOSIS — Z96653 Presence of artificial knee joint, bilateral: Secondary | ICD-10-CM | POA: Diagnosis present

## 2023-01-25 DIAGNOSIS — K529 Noninfective gastroenteritis and colitis, unspecified: Principal | ICD-10-CM | POA: Insufficient documentation

## 2023-01-25 DIAGNOSIS — Z90711 Acquired absence of uterus with remaining cervical stump: Secondary | ICD-10-CM

## 2023-01-25 DIAGNOSIS — Z9884 Bariatric surgery status: Secondary | ICD-10-CM

## 2023-01-25 DIAGNOSIS — Z792 Long term (current) use of antibiotics: Secondary | ICD-10-CM

## 2023-01-25 DIAGNOSIS — J45909 Unspecified asthma, uncomplicated: Secondary | ICD-10-CM | POA: Diagnosis present

## 2023-01-25 DIAGNOSIS — K219 Gastro-esophageal reflux disease without esophagitis: Secondary | ICD-10-CM | POA: Diagnosis present

## 2023-01-25 DIAGNOSIS — R112 Nausea with vomiting, unspecified: Principal | ICD-10-CM

## 2023-01-25 DIAGNOSIS — Z87440 Personal history of urinary (tract) infections: Secondary | ICD-10-CM

## 2023-01-25 DIAGNOSIS — R519 Headache, unspecified: Secondary | ICD-10-CM | POA: Diagnosis present

## 2023-01-25 DIAGNOSIS — R109 Unspecified abdominal pain: Secondary | ICD-10-CM | POA: Diagnosis present

## 2023-01-25 DIAGNOSIS — Z880 Allergy status to penicillin: Secondary | ICD-10-CM

## 2023-01-25 DIAGNOSIS — G43909 Migraine, unspecified, not intractable, without status migrainosus: Secondary | ICD-10-CM | POA: Diagnosis present

## 2023-01-25 DIAGNOSIS — Z825 Family history of asthma and other chronic lower respiratory diseases: Secondary | ICD-10-CM

## 2023-01-25 DIAGNOSIS — Z91018 Allergy to other foods: Secondary | ICD-10-CM

## 2023-01-25 DIAGNOSIS — F419 Anxiety disorder, unspecified: Secondary | ICD-10-CM | POA: Diagnosis present

## 2023-01-25 DIAGNOSIS — K5900 Constipation, unspecified: Secondary | ICD-10-CM | POA: Diagnosis present

## 2023-01-25 DIAGNOSIS — F418 Other specified anxiety disorders: Secondary | ICD-10-CM

## 2023-01-25 DIAGNOSIS — Z8711 Personal history of peptic ulcer disease: Secondary | ICD-10-CM

## 2023-01-25 DIAGNOSIS — Z91048 Other nonmedicinal substance allergy status: Secondary | ICD-10-CM

## 2023-01-25 DIAGNOSIS — Z888 Allergy status to other drugs, medicaments and biological substances status: Secondary | ICD-10-CM

## 2023-01-25 DIAGNOSIS — R1013 Epigastric pain: Secondary | ICD-10-CM

## 2023-01-25 DIAGNOSIS — Z903 Acquired absence of stomach [part of]: Secondary | ICD-10-CM

## 2023-01-25 DIAGNOSIS — G8929 Other chronic pain: Secondary | ICD-10-CM | POA: Diagnosis present

## 2023-01-25 DIAGNOSIS — E876 Hypokalemia: Secondary | ICD-10-CM | POA: Diagnosis present

## 2023-01-25 DIAGNOSIS — Z7985 Long-term (current) use of injectable non-insulin antidiabetic drugs: Secondary | ICD-10-CM

## 2023-01-25 DIAGNOSIS — Z87891 Personal history of nicotine dependence: Secondary | ICD-10-CM

## 2023-01-25 DIAGNOSIS — Z86010 Personal history of colonic polyps: Secondary | ICD-10-CM

## 2023-01-25 DIAGNOSIS — Z885 Allergy status to narcotic agent status: Secondary | ICD-10-CM

## 2023-01-25 DIAGNOSIS — Z9851 Tubal ligation status: Secondary | ICD-10-CM

## 2023-01-25 DIAGNOSIS — Z8719 Personal history of other diseases of the digestive system: Secondary | ICD-10-CM

## 2023-01-25 DIAGNOSIS — Z8249 Family history of ischemic heart disease and other diseases of the circulatory system: Secondary | ICD-10-CM

## 2023-01-25 DIAGNOSIS — D75839 Thrombocytosis, unspecified: Secondary | ICD-10-CM | POA: Diagnosis not present

## 2023-01-25 DIAGNOSIS — F32A Depression, unspecified: Secondary | ICD-10-CM | POA: Diagnosis present

## 2023-01-25 LAB — CBC WITH DIFFERENTIAL/PLATELET
Abs Immature Granulocytes: 0.03 10*3/uL (ref 0.00–0.07)
Basophils Absolute: 0 10*3/uL (ref 0.0–0.1)
Basophils Relative: 0 %
Eosinophils Absolute: 0 10*3/uL (ref 0.0–0.5)
Eosinophils Relative: 0 %
HCT: 33.2 % — ABNORMAL LOW (ref 36.0–46.0)
Hemoglobin: 10.6 g/dL — ABNORMAL LOW (ref 12.0–15.0)
Immature Granulocytes: 0 %
Lymphocytes Relative: 13 %
Lymphs Abs: 1.5 10*3/uL (ref 0.7–4.0)
MCH: 30.6 pg (ref 26.0–34.0)
MCHC: 31.9 g/dL (ref 30.0–36.0)
MCV: 96 fL (ref 80.0–100.0)
Monocytes Absolute: 0.8 10*3/uL (ref 0.1–1.0)
Monocytes Relative: 7 %
Neutro Abs: 9.6 10*3/uL — ABNORMAL HIGH (ref 1.7–7.7)
Neutrophils Relative %: 80 %
Platelets: 503 10*3/uL — ABNORMAL HIGH (ref 150–400)
RBC: 3.46 MIL/uL — ABNORMAL LOW (ref 3.87–5.11)
RDW: 14.5 % (ref 11.5–15.5)
WBC: 12 10*3/uL — ABNORMAL HIGH (ref 4.0–10.5)
nRBC: 0 % (ref 0.0–0.2)

## 2023-01-25 LAB — COMPREHENSIVE METABOLIC PANEL
ALT: 8 U/L (ref 0–44)
AST: 11 U/L — ABNORMAL LOW (ref 15–41)
Albumin: 3 g/dL — ABNORMAL LOW (ref 3.5–5.0)
Alkaline Phosphatase: 56 U/L (ref 38–126)
Anion gap: 10 (ref 5–15)
BUN: 13 mg/dL (ref 6–20)
CO2: 26 mmol/L (ref 22–32)
Calcium: 8.9 mg/dL (ref 8.9–10.3)
Chloride: 101 mmol/L (ref 98–111)
Creatinine, Ser: 0.7 mg/dL (ref 0.44–1.00)
GFR, Estimated: >60 mL/min (ref >60)
Glucose, Bld: 95 mg/dL (ref 70–99)
Potassium: 3.4 mmol/L — ABNORMAL LOW (ref 3.5–5.1)
Sodium: 137 mmol/L (ref 135–145)
Total Bilirubin: 0.5 mg/dL (ref 0.3–1.2)
Total Protein: 6.4 g/dL — ABNORMAL LOW (ref 6.5–8.1)

## 2023-01-25 LAB — METHYLMALONIC ACID, SERUM: Methylmalonic Acid, Quantitative: 118 nmol/L (ref 0–378)

## 2023-01-25 LAB — VITAMIN B1: Vitamin B1 (Thiamine): 54.7 nmol/L — ABNORMAL LOW (ref 66.5–200.0)

## 2023-01-25 LAB — LIPASE, BLOOD: Lipase: 21 U/L (ref 11–51)

## 2023-01-25 MED ORDER — ONDANSETRON 8 MG PO TBDP
8.0000 mg | ORAL_TABLET | Freq: Once | ORAL | Status: AC
  Administered 2023-01-25: 8 mg via ORAL
  Filled 2023-01-25: qty 1

## 2023-01-25 MED ORDER — FENTANYL CITRATE PF 50 MCG/ML IJ SOSY
100.0000 ug | PREFILLED_SYRINGE | Freq: Once | INTRAMUSCULAR | Status: AC
  Administered 2023-01-25: 100 ug via INTRAVENOUS
  Filled 2023-01-25: qty 2

## 2023-01-25 MED ORDER — DROPERIDOL 2.5 MG/ML IJ SOLN
1.2500 mg | Freq: Once | INTRAMUSCULAR | Status: AC
  Administered 2023-01-25: 1.25 mg via INTRAVENOUS
  Filled 2023-01-25: qty 2

## 2023-01-25 MED ORDER — SODIUM CHLORIDE 0.9 % IV BOLUS (SEPSIS)
1000.0000 mL | Freq: Once | INTRAVENOUS | Status: AC
  Administered 2023-01-25: 1000 mL via INTRAVENOUS

## 2023-01-25 MED ORDER — IOHEXOL 300 MG/ML  SOLN
100.0000 mL | Freq: Once | INTRAMUSCULAR | Status: AC | PRN
  Administered 2023-01-25: 100 mL via INTRAVENOUS

## 2023-01-25 MED ORDER — SODIUM CHLORIDE (PF) 0.9 % IJ SOLN
INTRAMUSCULAR | Status: AC
  Filled 2023-01-25: qty 50

## 2023-01-25 MED ORDER — SODIUM CHLORIDE 0.9 % IV SOLN
1000.0000 mL | INTRAVENOUS | Status: DC
  Administered 2023-01-25: 1000 mL via INTRAVENOUS

## 2023-01-25 MED ORDER — PANTOPRAZOLE SODIUM 40 MG IV SOLR
40.0000 mg | Freq: Once | INTRAVENOUS | Status: AC
  Administered 2023-01-26: 40 mg via INTRAVENOUS
  Filled 2023-01-25: qty 10

## 2023-01-25 NOTE — ED Provider Triage Note (Signed)
Emergency Medicine Provider Triage Evaluation Note  [name] , a 47 y.o. female  was evaluated in triage.  Pt complains of epigastric abdominal pain with associated nausea and vomiting.  Also endorses bright red blood per rectum with bowel movements.  Since being discharged from the hospital she has had 2 bowel movements both with bloody stools.  Denies melanotic stools.  Compliant with home medications..  Review of Systems  Positive: As above Negative: As abov  Physical Exam  BP (!) 142/88 (BP Location: Left Arm)   Pulse 91   Temp 99 F (37.2 C) (Oral)   Resp 16   LMP 06/02/2017 Comment: hysterectomy 06/08/2017  SpO2 100%  Gen:   Awake, no distress   Resp:  Normal effort  MSK:   Moves extremities without difficulty  Other:    Medical Decision Making  Medically screening exam initiated at 8:29 PM.  Appropriate orders placed.  LOVEAH LIKE was informed that the remainder of the evaluation will be completed by another provider, this initial triage assessment does not replace that evaluation, and the importance of remaining in the ED until their evaluation is complete.     Marita [place], PA-C 01/25/23 2030

## 2023-01-25 NOTE — ED Triage Notes (Signed)
Pt arrives POV c/o upper/mid abd pain and nausea with emesis. Symptoms have been ongoing for several days. States that she is unable to keep anything down and had several episodes of emesis in the past 24 hours. Pt also c/o rib pain due to vomiting so much. Recently diagnosed with gastric ulcers via EGD. Has been taking sucralfate and protonix without relief.

## 2023-01-25 NOTE — ED Provider Notes (Signed)
[place] EMERGENCY DEPARTMENT AT [facility] Provider Note   CSN: [id number] Arrival date & time: 01/25/23  1944     History  Chief Complaint  Patient presents with   Abdominal Pain   Nausea   Emesis    [name] is a 47 y.o. female.   Abdominal Pain Associated symptoms: vomiting   Emesis Associated symptoms: abdominal pain      Patient presents ED with complaints of persistent abdominal pain associated with nausea and vomiting.  Patient states that she has also noticed blood in her stools.  Patient has a history of depression colitis anemia GI bleeding, peripheral neuropathy intractable abdominal pain who was recently admitted to the hospital on April 21.  Patient was discharged on April 25.  While she was in the hospital the patient was treated for ulcerative colitis, persistent abdominal pain.  Patient had a CT scan that showed thick-walled appearance to small bowel loops.  Patient was treated with pain medications.  She did require blood transfusion.  Patient had an EGD that showed erosions and ulcers involving her anastomosis and jejunum.  Patient states she started developing symptoms nausea vomiting and blood in her stool again.  She is having severe abdominal pain not resolving with her medications.  Home Medications Prior to Admission medications   Medication Sig Start Date End Date Taking? Authorizing Provider  albuterol (VENTOLIN HFA) 108 (90 Base) MCG/ACT inhaler Inhale 2 puffs into the lungs every 6 (six) hours as needed for wheezing or shortness of breath.   Yes [provider]  Atogepant (QULIPTA) 30 MG TABS Take 1 tablet (30 mg total) by mouth daily. 11/02/22  Yes Ocie Doyne, MD  atorvastatin (LIPITOR) 40 MG tablet Take 40 mg by mouth daily. 12/10/22  Yes [provider]  baclofen (LIORESAL) 10 MG tablet TAKE 1 TABLET BY MOUTH EVERYDAY AT BEDTIME Patient taking differently: Take 10 mg by mouth in the morning, at noon, and at  bedtime. Take 1 tablet (10 mg) in morning, noon and Take 2 tablets (20 mg) at bedtime 12/18/20  Yes Glean Salvo, NP  buPROPion (WELLBUTRIN XL) 150 MG 24 hr tablet Take 150 mg by mouth every morning. Take along with 300 mg=450 mg   Yes [provider]  buPROPion (WELLBUTRIN XL) 300 MG 24 hr tablet Take 300 mg by mouth every morning. Take along with 150 mg=450 mg 06/05/20  Yes [provider]  busPIRone (BUSPAR) 15 MG tablet Take 15 mg by mouth 3 (three) times daily.   Yes [provider]  Cholecalciferol (VITAMIN D3) 1.25 MG (50000 UT) CAPS Take 50,000 Units by mouth every 7 (seven) days.  04/03/19  Yes [provider]  cyanocobalamin (,VITAMIN B-12,) 1000 MCG/ML injection Inject 1,000 mcg into the muscle 2 (two) times a week.  04/06/19  Yes [provider]  diclofenac Sodium (VOLTAREN) 1 % GEL Apply topically 4 (four) times daily.   Yes [provider]  dicyclomine (BENTYL) 10 MG capsule Take 10 mg by mouth 2 (two) times daily as needed for spasms. 12/17/20  Yes [provider]  DULoxetine (CYMBALTA) 60 MG capsule Take 1 capsule (60 mg total) by mouth 2 (two) times daily. 04/10/19  Yes Aldean Baker, NP  EPINEPHrine 0.3 mg/0.3 mL IJ SOAJ injection Inject 0.3 mg into the muscle as needed for anaphylaxis. 04/12/21  Yes Haskel Schroeder, PA-C  ferrous sulfate 325 (65 FE) MG tablet Take 1 tablet (325 mg total) by mouth 2 (  two) times daily with a meal. Patient taking differently: Take 325 mg by mouth daily with breakfast. 01/24/20  Yes Cassandria Anger, PA-C  fluticasone (FLONASE) 50 MCG/ACT nasal spray Place 2 sprays into both nostrils daily as needed for allergies or rhinitis.   Yes [provider]  gabapentin (NEURONTIN) 400 MG capsule Take 400 mg by mouth 3 (three) times daily.   Yes [provider]  hydrocortisone (ANUSOL-HC) 2.5 % rectal cream Place 1 Application rectally daily as needed for hemorrhoids. 07/29/18  Yes  [provider]  HYDROmorphone (DILAUDID) 4 MG tablet Take 1 tablet (4 mg total) by mouth every 3 (three) hours. 01/07/23  Yes   linaclotide (LINZESS) 290 MCG CAPS capsule Take 290 mcg by mouth daily before breakfast.   Yes [provider]  magnesium oxide (MAG-OX) 400 (240 Mg) MG tablet Take 1 tablet by mouth daily. 12/19/22  Yes [provider]  meclizine (ANTIVERT) 25 MG tablet Take 25 mg by mouth 3 (three) times daily. 04/15/22  Yes [provider]  mesalamine (LIALDA) 1.2 g EC tablet Take 2.4 g by mouth daily with breakfast.   Yes [provider]  mirtazapine (REMERON) 15 MG tablet Take 15 mg by mouth at bedtime as needed (sleep).   Yes [provider]  Multiple Vitamin (MULTIVITAMIN WITH MINERALS) TABS tablet Take 1 tablet by mouth daily.   Yes [provider]  NARCAN 4 MG/0.1ML LIQD nasal spray kit Place 1 spray into the nose daily as needed (overdose). 03/18/20  Yes [provider]  ondansetron (ZOFRAN) 8 MG tablet Take 8 mg by mouth every 8 (eight) hours as needed for nausea or vomiting.   Yes [provider]  oxybutynin (DITROPAN) 5 MG tablet Take 5 mg by mouth daily.   Yes [provider]  OZEMPIC, 2 MG/DOSE, 8 MG/3ML SOPN Inject 2 mg into the skin once a week.   Yes [provider]  pantoprazole (PROTONIX) 40 MG tablet TAKE 1 TABLET BY MOUTH TWICE A DAY BEFORE A MEAL 02/22/20  Yes Iva Boop, MD  phentermine (ADIPEX-P) 37.5 MG tablet Take 37.5 mg by mouth daily before breakfast.   Yes [provider]  sucralfate (CARAFATE) 1 g tablet Take 1 tablet (1 g total) by mouth every 6 (six) hours. 01/21/23  Yes Arnetha Courser, MD  SUMAtriptan (IMITREX) 50 MG tablet Take 1 tablet (50 mg total) by mouth every 2 (two) hours as needed for migraine. May repeat in 2 hours if headache persists or recurs. 11/02/22  Yes Ocie Doyne, MD  SYMPROIC 0.2 MG TABS Take 1 tablet by mouth daily. 05/13/21  Yes  [provider]  thiamine (VITAMIN B-1) 100 MG tablet Take 100 mg by mouth daily at 12 noon.    Yes [provider]  torsemide (DEMADEX) 10 MG tablet Take 10 mg by mouth daily. 11/10/21  Yes [provider]  valACYclovir (VALTREX) 1000 MG tablet Take 1,000 mg by mouth 2 (two) times daily.   Yes [provider]  botulinum toxin Type A (BOTOX) 200 units injection INJECT 155 UNITS INTRAMUSCULARLY INTO HEAD AND NECK MUSCLES EVERY 3 MONTHS 11/23/22   Levert Feinstein, MD  LORazepam (ATIVAN) 1 MG tablet Take 1 mg by mouth 2 (two) times daily as needed for anxiety.    [provider]      Allergies    Coconut (cocos nucifera), Coconut oil, Morphine and related, Oxycodone, Oxycodone-acetaminophen, Ciprofloxacin, Penicillamine, Trazodone and nefazodone, Vistaril [hydroxyzine hcl], Doxycycline, Silicone,  Penicillins, and Tape    Review of Systems   Review of Systems  Gastrointestinal:  Positive for abdominal pain and vomiting.    Physical Exam Updated Vital Signs BP (!) 143/91   Pulse 79   Temp 99 F (37.2 C) (Oral)   Resp 13   LMP 06/02/2017 Comment: hysterectomy 06/08/2017  SpO2 98%  Physical Exam Vitals and nursing note reviewed.  Constitutional:      Appearance: She is well-developed. She is ill-appearing.  HENT:     Head: Normocephalic and atraumatic.     Right Ear: External ear normal.     Left Ear: External ear normal.  Eyes:     General: No scleral icterus.       Right eye: No discharge.        Left eye: No discharge.     Conjunctiva/sclera: Conjunctivae normal.  Neck:     Trachea: No tracheal deviation.  Cardiovascular:     Rate and Rhythm: Normal rate and regular rhythm.  Pulmonary:     Effort: Pulmonary effort is normal. No respiratory distress.     Breath sounds: Normal breath sounds. No stridor. No wheezing or rales.  Abdominal:     General: Bowel sounds are normal. There is no distension.     Palpations: Abdomen is soft.      Tenderness: There is generalized abdominal tenderness. There is no guarding or rebound.  Musculoskeletal:        General: No tenderness or deformity.     Cervical back: Neck supple.  Skin:    General: Skin is warm and dry.     Findings: No rash.  Neurological:     General: No focal deficit present.     Mental Status: She is alert.     Cranial Nerves: No cranial nerve deficit, dysarthria or facial asymmetry.     Sensory: No sensory deficit.     Motor: No abnormal muscle tone or seizure activity.     Coordination: Coordination normal.  Psychiatric:        Mood and Affect: Mood normal.     ED Results / Procedures / Treatments   Labs (all labs ordered are listed, but only abnormal results are displayed) Labs Reviewed  CBC WITH DIFFERENTIAL/PLATELET - Abnormal; Notable for the following components:      Result Value   WBC 12.0 (*)    RBC 3.46 (*)    Hemoglobin 10.6 (*)    HCT 33.2 (*)    Platelets 503 (*)    Neutro Abs 9.6 (*)    All other components within normal limits  COMPREHENSIVE METABOLIC PANEL - Abnormal; Notable for the following components:   Potassium 3.4 (*)    Total Protein 6.4 (*)    Albumin 3.0 (*)    AST 11 (*)    All other components within normal limits  LIPASE, BLOOD  URINALYSIS, ROUTINE W REFLEX MICROSCOPIC    EKG None  Radiology CT ABDOMEN PELVIS W CONTRAST  Result Date: 01/25/2023 CLINICAL DATA:  Acute abdominal pain EXAM: CT ABDOMEN AND PELVIS WITH CONTRAST TECHNIQUE: Multidetector CT imaging of the abdomen and pelvis was performed using the standard protocol following bolus administration of intravenous contrast. RADIATION DOSE REDUCTION: This exam was performed according to the departmental dose-optimization program which includes automated exposure control, adjustment of the mA and/or kV according to patient size and/or use of iterative reconstruction technique. CONTRAST:  OMNIPAQUE IOHEXOL 300 MG/ML  SOLN COMPARISON:  CT abdomen and pelvis  01/17/2023 FINDINGS:  Lower chest: There is a new trace left pleural effusion. Hepatobiliary: No focal liver abnormality is seen. No gallstones, gallbladder wall thickening, or biliary dilatation. Pancreas: Unremarkable. No pancreatic ductal dilatation or surrounding inflammatory changes. 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 Spleen is normal in size. Adrenals/Urinary Tract: Adrenal glands are unremarkable. Kidneys are normal, without renal calculi, focal lesion, or hydronephrosis. Bladder is unremarkable. Stomach/Bowel: Patient is status post gastric bypass. Again seen is wall thickening and edema of the small bowel loop extending from the gastrojejunal anastomosis. There is increased inflammatory stranding at the level of the anastomosis. There is no evidence for bowel obstruction, pneumatosis or free air. The appendix is not visualized. Vascular/Lymphatic: No significant vascular findings are present. No enlarged abdominal or pelvic lymph nodes. Reproductive: Status post hysterectomy. No adnexal masses. Other: There is new trace free fluid in the left upper quadrant and pelvis. No focal abdominal wall hernia. Musculoskeletal: Left-sided sacral stimulator device again seen. No acute osseous abnormality. IMPRESSION: 1. Status post gastric bypass. Again seen is wall thickening and edema of the small bowel loop extending from the gastrojejunal anastomosis. There is increased inflammatory stranding at the level of the anastomosis. Findings are concerning for enteritis. No bowel obstruction or free air. 2. New trace free fluid in the left upper quadrant and pelvis. 3. New trace left pleural effusion. Electronically Signed   By: Darliss Cheney M.D.   On: 01/25/2023 23:12    Procedures Procedures    Medications Ordered in ED Medications  sodium chloride 0.9 % bolus 1,000 mL (0 mLs Intravenous Stopped 01/25/23 2319)    Followed by  0.9 %  sodium chloride infusion (1,000 mLs Intravenous New Bag/Given  01/25/23 2329)  pantoprazole (PROTONIX) injection 40 mg (has no administration in time range)  ondansetron (ZOFRAN-ODT) disintegrating tablet 8 mg (8 mg Oral Given 01/25/23 2043)  droperidol (INAPSINE) 2.5 MG/ML injection 1.25 mg (1.25 mg Intravenous Given 01/25/23 2146)  fentaNYL (SUBLIMAZE) injection 100 mcg (100 mcg Intravenous Given 01/25/23 2145)  iohexol (OMNIPAQUE) 300 MG/ML solution 100 mL (100 mLs Intravenous Contrast Given 01/25/23 2251)    ED Course/ Medical Decision Making/ A&P Clinical Course as of 01/26/23 0003  Mon Jan 25, 2023  2231 Comprehensive metabolic panel(!) Metabolic panel without significant abnormalities. [JK]  2232 CBC with Differential(!) Cytosis noted.  Hemoglobin improved compared to previous values [JK]  2235 Symptoms improved with medications. [JK]  2342 CT scan shows persistent wall thickening edema in the small bowel loop extending from the gastrojejunal anastomosis.  Patient also has increased inflammatory stranding at the level of the anastomosis.  Symptoms concerning for enteritis [JK]  2346 Patient still having persistent pain.  Nausea has improved [JK]  Tue Jan 26, 2023  0003 Case discussed with Dr Allena Katz.   Consult requested to [facility], Dr Dulce Sellar via secure chat  [JK]    Clinical Course User Index [JK] Linwood Dibbles, MD                             Medical Decision Making Problems Addressed: Enteritis: acute illness or injury that poses a threat to life or bodily functions Epigastric pain: acute illness or injury that poses a threat to life or bodily functions Nausea and vomiting, unspecified vomiting type: acute illness or injury that poses a threat to life or bodily functions  Amount and/or Complexity of Data Reviewed Labs: ordered. Decision-making details documented in ED Course. Radiology: ordered and independent interpretation  performed.  Risk Prescription drug management. Decision regarding hospitalization.   She did not initially  presented to the hospital several days ago for issues with nausea vomiting intractable vomiting, abdominal pain.  Patient was evaluated by GI and had an endoscopy.  Patient has had issues with persistent abdominal pain and acid reflux vomiting.  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  Patient was discharged home with antacids.  She return for issues with recurrent vomiting abdominal pain and also noticed blood in her stool again.  Patient does not have evidence of worsening anemia.  Hemoglobin is actually improved.  Electrolyte panel showed mild hypokalemia.  CT scan was performed and it does show persistent and increasing inflammation at the area of anastomosis.  No evidence of abscess or obstruction.  Patient has been treated with IV fluids antiemetics and pain medications.  She also has been given antacids.  Patient is still having persistent symptoms.  I will consult the medical service for admission and further treatment.        Final Clinical Impression(s) / ED Diagnoses Final diagnoses:  Nausea and vomiting, unspecified vomiting type  Epigastric pain  Enteritis    Rx / DC Orders ED Discharge Orders     None         Linwood Dibbles, MD 01/26/23 0003

## 2023-01-26 ENCOUNTER — Encounter (HOSPITAL_COMMUNITY): Payer: Self-pay | Admitting: Internal Medicine

## 2023-01-26 DIAGNOSIS — G8929 Other chronic pain: Secondary | ICD-10-CM | POA: Diagnosis present

## 2023-01-26 DIAGNOSIS — Z885 Allergy status to narcotic agent status: Secondary | ICD-10-CM | POA: Diagnosis not present

## 2023-01-26 DIAGNOSIS — K5900 Constipation, unspecified: Secondary | ICD-10-CM | POA: Diagnosis present

## 2023-01-26 DIAGNOSIS — Z8249 Family history of ischemic heart disease and other diseases of the circulatory system: Secondary | ICD-10-CM | POA: Diagnosis not present

## 2023-01-26 DIAGNOSIS — K219 Gastro-esophageal reflux disease without esophagitis: Secondary | ICD-10-CM | POA: Diagnosis present

## 2023-01-26 DIAGNOSIS — R109 Unspecified abdominal pain: Secondary | ICD-10-CM | POA: Diagnosis not present

## 2023-01-26 DIAGNOSIS — F419 Anxiety disorder, unspecified: Secondary | ICD-10-CM | POA: Diagnosis present

## 2023-01-26 DIAGNOSIS — Z7985 Long-term (current) use of injectable non-insulin antidiabetic drugs: Secondary | ICD-10-CM | POA: Diagnosis not present

## 2023-01-26 DIAGNOSIS — D75839 Thrombocytosis, unspecified: Secondary | ICD-10-CM | POA: Diagnosis not present

## 2023-01-26 DIAGNOSIS — G43909 Migraine, unspecified, not intractable, without status migrainosus: Secondary | ICD-10-CM | POA: Diagnosis present

## 2023-01-26 DIAGNOSIS — F418 Other specified anxiety disorders: Secondary | ICD-10-CM

## 2023-01-26 DIAGNOSIS — J45909 Unspecified asthma, uncomplicated: Secondary | ICD-10-CM | POA: Diagnosis present

## 2023-01-26 DIAGNOSIS — D649 Anemia, unspecified: Secondary | ICD-10-CM | POA: Diagnosis present

## 2023-01-26 DIAGNOSIS — Z8719 Personal history of other diseases of the digestive system: Secondary | ICD-10-CM | POA: Diagnosis not present

## 2023-01-26 DIAGNOSIS — F32A Depression, unspecified: Secondary | ICD-10-CM | POA: Diagnosis present

## 2023-01-26 DIAGNOSIS — R1110 Vomiting, unspecified: Secondary | ICD-10-CM

## 2023-01-26 DIAGNOSIS — K529 Noninfective gastroenteritis and colitis, unspecified: Secondary | ICD-10-CM | POA: Diagnosis present

## 2023-01-26 DIAGNOSIS — Z792 Long term (current) use of antibiotics: Secondary | ICD-10-CM | POA: Diagnosis not present

## 2023-01-26 DIAGNOSIS — Z87891 Personal history of nicotine dependence: Secondary | ICD-10-CM | POA: Diagnosis not present

## 2023-01-26 DIAGNOSIS — Z9884 Bariatric surgery status: Secondary | ICD-10-CM | POA: Diagnosis not present

## 2023-01-26 DIAGNOSIS — Z903 Acquired absence of stomach [part of]: Secondary | ICD-10-CM | POA: Diagnosis not present

## 2023-01-26 DIAGNOSIS — Z87440 Personal history of urinary (tract) infections: Secondary | ICD-10-CM | POA: Diagnosis not present

## 2023-01-26 DIAGNOSIS — K5190 Ulcerative colitis, unspecified, without complications: Secondary | ICD-10-CM | POA: Diagnosis present

## 2023-01-26 DIAGNOSIS — Z96653 Presence of artificial knee joint, bilateral: Secondary | ICD-10-CM | POA: Diagnosis present

## 2023-01-26 DIAGNOSIS — E876 Hypokalemia: Secondary | ICD-10-CM | POA: Diagnosis present

## 2023-01-26 DIAGNOSIS — Z825 Family history of asthma and other chronic lower respiratory diseases: Secondary | ICD-10-CM | POA: Diagnosis not present

## 2023-01-26 DIAGNOSIS — Z91018 Allergy to other foods: Secondary | ICD-10-CM | POA: Diagnosis not present

## 2023-01-26 DIAGNOSIS — Z79899 Other long term (current) drug therapy: Secondary | ICD-10-CM | POA: Diagnosis not present

## 2023-01-26 LAB — URINALYSIS, ROUTINE W REFLEX MICROSCOPIC
Bilirubin Urine: NEGATIVE
Glucose, UA: NEGATIVE mg/dL
Hgb urine dipstick: NEGATIVE
Ketones, ur: 20 mg/dL — AB
Leukocytes,Ua: NEGATIVE
Nitrite: NEGATIVE
Protein, ur: NEGATIVE mg/dL
Specific Gravity, Urine: >1.046 — ABNORMAL HIGH (ref 1.005–1.030)
pH: 6 (ref 5.0–8.0)

## 2023-01-26 LAB — CBC
HCT: 34.1 % — ABNORMAL LOW (ref 36.0–46.0)
Hemoglobin: 10.6 g/dL — ABNORMAL LOW (ref 12.0–15.0)
MCH: 30.6 pg (ref 26.0–34.0)
MCHC: 31.1 g/dL (ref 30.0–36.0)
MCV: 98.6 fL (ref 80.0–100.0)
Platelets: 443 10*3/uL — ABNORMAL HIGH (ref 150–400)
RBC: 3.46 MIL/uL — ABNORMAL LOW (ref 3.87–5.11)
RDW: 14.4 % (ref 11.5–15.5)
WBC: 12 10*3/uL — ABNORMAL HIGH (ref 4.0–10.5)
nRBC: 0 % (ref 0.0–0.2)

## 2023-01-26 LAB — BASIC METABOLIC PANEL
Anion gap: 11 (ref 5–15)
BUN: 12 mg/dL (ref 6–20)
CO2: 24 mmol/L (ref 22–32)
Calcium: 8.6 mg/dL — ABNORMAL LOW (ref 8.9–10.3)
Chloride: 102 mmol/L (ref 98–111)
Creatinine, Ser: 0.71 mg/dL (ref 0.44–1.00)
GFR, Estimated: >60 mL/min (ref >60)
Glucose, Bld: 82 mg/dL (ref 70–99)
Potassium: 3.6 mmol/L (ref 3.5–5.1)
Sodium: 137 mmol/L (ref 135–145)

## 2023-01-26 LAB — C-REACTIVE PROTEIN: CRP: 1.1 mg/dL — ABNORMAL HIGH (ref <1.0)

## 2023-01-26 LAB — SEDIMENTATION RATE: Sed Rate: 35 mm/hr — ABNORMAL HIGH (ref 0–22)

## 2023-01-26 MED ORDER — DULOXETINE HCL 30 MG PO CPEP
60.0000 mg | ORAL_CAPSULE | Freq: Two times a day (BID) | ORAL | Status: DC
  Administered 2023-01-26: 60 mg via ORAL
  Administered 2023-01-26: 30 mg via ORAL
  Administered 2023-01-26 – 2023-01-29 (×6): 60 mg via ORAL
  Filled 2023-01-26 (×8): qty 2

## 2023-01-26 MED ORDER — SODIUM CHLORIDE 0.9 % IV SOLN
2.0000 g | INTRAVENOUS | Status: DC
  Administered 2023-01-26 – 2023-01-28 (×3): 2 g via INTRAVENOUS
  Filled 2023-01-26 (×3): qty 20

## 2023-01-26 MED ORDER — MIRTAZAPINE 7.5 MG PO TABS: 15.0000 mg | ORAL_TABLET | Freq: Every evening | ORAL | Status: DC | PRN

## 2023-01-26 MED ORDER — POTASSIUM CHLORIDE IN NACL 40-0.9 MEQ/L-% IV SOLN
INTRAVENOUS | Status: AC
  Filled 2023-01-26 (×2): qty 1000

## 2023-01-26 MED ORDER — ATORVASTATIN CALCIUM 40 MG PO TABS
40.0000 mg | ORAL_TABLET | Freq: Every day | ORAL | Status: DC
  Administered 2023-01-26 – 2023-01-29 (×4): 40 mg via ORAL
  Filled 2023-01-26 (×4): qty 1

## 2023-01-26 MED ORDER — BUSPIRONE HCL 10 MG PO TABS
15.0000 mg | ORAL_TABLET | Freq: Three times a day (TID) | ORAL | Status: DC
  Administered 2023-01-26 – 2023-01-29 (×10): 15 mg via ORAL
  Filled 2023-01-26 (×3): qty 1.5
  Filled 2023-01-26: qty 3
  Filled 2023-01-26: qty 1.5
  Filled 2023-01-26: qty 3
  Filled 2023-01-26 (×2): qty 1.5
  Filled 2023-01-26 (×2): qty 3
  Filled 2023-01-26: qty 1.5
  Filled 2023-01-26: qty 3
  Filled 2023-01-26: qty 1.5
  Filled 2023-01-26: qty 3
  Filled 2023-01-26 (×2): qty 1.5
  Filled 2023-01-26 (×3): qty 3
  Filled 2023-01-26: qty 1.5
  Filled 2023-01-26: qty 3

## 2023-01-26 MED ORDER — ALBUTEROL SULFATE HFA 108 (90 BASE) MCG/ACT IN AERS: 2.0000 | INHALATION_SPRAY | Freq: Four times a day (QID) | RESPIRATORY_TRACT | Status: DC | PRN

## 2023-01-26 MED ORDER — OXYBUTYNIN CHLORIDE 5 MG PO TABS
5.0000 mg | ORAL_TABLET | Freq: Every day | ORAL | Status: DC
  Administered 2023-01-26 – 2023-01-28 (×3): 5 mg via ORAL
  Filled 2023-01-26 (×3): qty 1

## 2023-01-26 MED ORDER — PANTOPRAZOLE SODIUM 40 MG IV SOLR
40.0000 mg | Freq: Two times a day (BID) | INTRAVENOUS | Status: DC
  Administered 2023-01-26 – 2023-01-29 (×7): 40 mg via INTRAVENOUS
  Filled 2023-01-26 (×7): qty 10

## 2023-01-26 MED ORDER — BUPROPION HCL ER (XL) 150 MG PO TB24
150.0000 mg | ORAL_TABLET | Freq: Every morning | ORAL | Status: DC
  Administered 2023-01-26 – 2023-01-29 (×4): 150 mg via ORAL
  Filled 2023-01-26 (×4): qty 1

## 2023-01-26 MED ORDER — ONDANSETRON HCL 4 MG/2ML IJ SOLN
4.0000 mg | Freq: Four times a day (QID) | INTRAMUSCULAR | Status: DC | PRN
  Administered 2023-01-26 – 2023-01-27 (×3): 4 mg via INTRAVENOUS
  Filled 2023-01-26 (×3): qty 2

## 2023-01-26 MED ORDER — BACLOFEN 10 MG PO TABS
10.0000 mg | ORAL_TABLET | Freq: Two times a day (BID) | ORAL | Status: DC
  Administered 2023-01-26 – 2023-01-29 (×8): 10 mg via ORAL
  Filled 2023-01-26 (×8): qty 1

## 2023-01-26 MED ORDER — BUPROPION HCL ER (XL) 150 MG PO TB24
300.0000 mg | ORAL_TABLET | Freq: Every morning | ORAL | Status: DC
  Administered 2023-01-26 – 2023-01-29 (×4): 300 mg via ORAL
  Filled 2023-01-26 (×4): qty 2

## 2023-01-26 MED ORDER — FLUTICASONE PROPIONATE 50 MCG/ACT NA SUSP: 2.0000 | Freq: Every day | NASAL | Status: DC | PRN

## 2023-01-26 MED ORDER — METRONIDAZOLE 500 MG/100ML IV SOLN
500.0000 mg | Freq: Two times a day (BID) | INTRAVENOUS | Status: DC
  Administered 2023-01-26 – 2023-01-29 (×6): 500 mg via INTRAVENOUS
  Filled 2023-01-26 (×6): qty 100

## 2023-01-26 MED ORDER — GABAPENTIN 400 MG PO CAPS
400.0000 mg | ORAL_CAPSULE | Freq: Three times a day (TID) | ORAL | Status: DC
  Administered 2023-01-26 – 2023-01-29 (×10): 400 mg via ORAL
  Filled 2023-01-26 (×10): qty 1

## 2023-01-26 MED ORDER — MESALAMINE 1.2 G PO TBEC
2.4000 g | DELAYED_RELEASE_TABLET | Freq: Every day | ORAL | Status: DC
  Administered 2023-01-26 – 2023-01-29 (×4): 2.4 g via ORAL
  Filled 2023-01-26 (×4): qty 2

## 2023-01-26 MED ORDER — HYDROMORPHONE HCL 1 MG/ML IJ SOLN
1.0000 mg | INTRAMUSCULAR | Status: DC | PRN
  Administered 2023-01-26 – 2023-01-28 (×13): 1 mg via INTRAVENOUS
  Filled 2023-01-26 (×13): qty 1

## 2023-01-26 MED ORDER — SODIUM CHLORIDE 0.9 % IV SOLN: 1000.0000 mL | INTRAVENOUS | Status: DC

## 2023-01-26 MED ORDER — BACLOFEN 10 MG PO TABS
20.0000 mg | ORAL_TABLET | Freq: Every day | ORAL | Status: DC
  Administered 2023-01-26 – 2023-01-28 (×4): 20 mg via ORAL
  Filled 2023-01-26 (×4): qty 2

## 2023-01-26 MED ORDER — LINACLOTIDE 145 MCG PO CAPS
290.0000 ug | ORAL_CAPSULE | Freq: Every day | ORAL | Status: DC
  Administered 2023-01-26 – 2023-01-29 (×4): 290 ug via ORAL
  Filled 2023-01-26 (×4): qty 2

## 2023-01-26 MED ORDER — ONDANSETRON HCL 4 MG PO TABS
4.0000 mg | ORAL_TABLET | Freq: Four times a day (QID) | ORAL | Status: DC | PRN
  Administered 2023-01-27 – 2023-01-29 (×3): 4 mg via ORAL
  Filled 2023-01-26 (×3): qty 1

## 2023-01-26 MED ORDER — ALBUTEROL SULFATE (2.5 MG/3ML) 0.083% IN NEBU: 2.5000 mg | INHALATION_SOLUTION | Freq: Four times a day (QID) | RESPIRATORY_TRACT | Status: DC | PRN

## 2023-01-26 MED ORDER — VALACYCLOVIR HCL 500 MG PO TABS
1000.0000 mg | ORAL_TABLET | Freq: Two times a day (BID) | ORAL | Status: DC
  Administered 2023-01-26 – 2023-01-29 (×6): 1000 mg via ORAL
  Filled 2023-01-26 (×6): qty 2

## 2023-01-26 MED ORDER — DICYCLOMINE HCL 10 MG PO CAPS
10.0000 mg | ORAL_CAPSULE | Freq: Two times a day (BID) | ORAL | Status: DC | PRN
  Administered 2023-01-26 – 2023-01-27 (×2): 10 mg via ORAL
  Filled 2023-01-26 (×2): qty 1

## 2023-01-26 MED ORDER — SUCRALFATE 1 G PO TABS
1.0000 g | ORAL_TABLET | Freq: Four times a day (QID) | ORAL | Status: DC
  Administered 2023-01-26 – 2023-01-29 (×13): 1 g via ORAL
  Filled 2023-01-26 (×14): qty 1

## 2023-01-26 MED ORDER — LORAZEPAM 1 MG PO TABS
1.0000 mg | ORAL_TABLET | Freq: Two times a day (BID) | ORAL | Status: DC | PRN
  Administered 2023-01-27 – 2023-01-28 (×3): 1 mg via ORAL
  Filled 2023-01-26 (×3): qty 1

## 2023-01-26 MED ORDER — ATOGEPANT 30 MG PO TABS
30.0000 mg | ORAL_TABLET | Freq: Every day | ORAL | Status: DC
  Administered 2023-01-27 – 2023-01-29 (×3): 30 mg via ORAL

## 2023-01-26 MED ORDER — NALDEMEDINE TOSYLATE 0.2 MG PO TABS
1.0000 | ORAL_TABLET | Freq: Every day | ORAL | Status: DC
  Administered 2023-01-27 – 2023-01-29 (×3): 1 via ORAL

## 2023-01-26 NOTE — Progress Notes (Signed)
Mobility Specialist - Progress Note   01/26/23 1408  Mobility  Activity Ambulated with assistance in hallway  Level of Assistance Contact guard assist, steadying assist  Assistive Device Other (Comment) (iv pole)  Distance Ambulated (ft) 500 ft  Range of Motion/Exercises Active  Activity Response Tolerated well  Mobility Referral Yes  $Mobility charge 1 Mobility   Pt received in bed and agreed to mobility, had one instance of LOB, pt corrected.  Pt had no nausea or dizziness throughout session. Pt returned to bed with all needs met and family in room.  Marilynne Halsted Mobility Specialist

## 2023-01-26 NOTE — TOC Progression Note (Signed)
Transition of Care [facility]) - Progression Note    Patient Details  Name: [name] MRN: [id number] Date of Birth: [birth year]  Transition of Care (TOC) CM/SW Contact  Beckie Busing, RN Phone Number:[phone]  01/26/2023, 3:31 PM  Clinical Narrative:     Transition of Care [facility]) Screening Note   Patient Details  Name: [name] Date of Birth: 21-Oct-1975   Transition of Care [facility]) CM/SW Contact:    Beckie Busing, RN Phone Number: 01/26/2023, 3:33 PM    Transition of Care Department [facility]) has reviewed patient and no TOC needs have been identified at this time. We will continue to monitor patient advancement through interdisciplinary progression rounds. If new patient transition needs arise, please place a TOC consult.          Expected Discharge Plan and Services                                               Social Determinants of Health (SDOH) Interventions SDOH Screenings   Food Insecurity: No Food Insecurity (01/26/2023)  Housing: Low Risk  (01/26/2023)  Transportation Needs: No Transportation Needs (01/26/2023)  Utilities: Not At Risk (01/26/2023)  Alcohol Screen: Medium Risk (04/08/2019)  Depression (PHQ2-9): Low Risk  (01/05/2020)  Financial Resource Strain: Low Risk  (04/08/2019)  Physical Activity: Inactive (04/08/2019)  Social Connections: Socially Isolated (04/08/2019)  Stress: Stress Concern Present (04/08/2019)  Tobacco Use: Medium Risk (01/26/2023)    Readmission Risk Interventions     No data to display

## 2023-01-26 NOTE — Consult Note (Signed)
[facility] Gastroenterology Consult  Referring Provider: No ref. provider found Primary Care Physician:  Felix Pacini, FNP Primary Gastroenterologist: Gentry Fitz  Reason for Consultation: Intractable nausea and vomiting  SUBJECTIVE:   HPI: [name] is a 47 y.o. female with past medical history significant for ulcerative colitis diagnosed in 2015 (on Lialda 1.2 g twice daily), history of bariatric surgery with revision and gastric bypass in 2021, iron deficiency.  Was recently admitted 01/17/2023 - 01/21/2023 for upper GI bleeding.  She underwent EGD on 01/20/2023 by Dr. Marca Ancona  which showed previous gastric bypass, gastric pouch showed ulceration, anastomosis showed clean-based gastric ulcers, there were multiple clean-based ulcers in the jejunum.  Stomach biopsy showed no significant diagnostic alteration, no H. pylori.  Esophagus biopsy showed squamous mucosa without significant alteration, no H. pylori.  Patient seen and evaluated.  Noted that she has been experiencing intractable nausea and nonbloody vomiting.  Pantoprazole and sucralfate.  She has been experiencing constipation for which she takes Linzess to 90 mcg daily.  She has not been seeing a small amount of blood in her stool.  She has been experiencing some chest pain as well as abdominal pain.    Labs on presentation showed WBC 12, hemoglobin 10.6, platelet 443.  Colonoscopy October 2019 for left-sided chronic UC (Dr. Leone Payor) showed normal terminal ileum, granular mucosa of the appendiceal orifice (focally active colitis), internal hemorrhoids, other colon biopsies were benign.  Past Medical History:  Diagnosis Date   Anal fissure - posterior 10/16/2014    OCC  ISSUES   Anxiety    doesn't take anything   Asthma    has Albuterol inhaler as needed   Boil    on pubic area started septra 03-01-17 draining blood and pus   Bronchitis    Chronic headache disorder 07/22/2016   Clostridium difficile infection 04/20/2012    Colitis    Dehydration    Depression    Family history of adverse reaction to anesthesia    pt mom gets sick   GERD (gastroesophageal reflux disease)    takes Pantoprazole daily   Headache    Hiatal hernia    neuropathy - mild in arms and legs   History of blood transfusion    last transfusion was 04/04/2016=Benadryl was given d/t itching. States she always itches with transfusion.    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   Marginal ulcer 10/25/2017   Migraine    Motion sickness    Nausea    takes Zofran as needed   Nausea and vomiting    for 1 year   Obesity    Oligouria    Osteoarthritis    lower back, knees, wrists - no meds   Peripheral neuropathy 03/01/2019   Pneumonia 1997   Postoperative nausea and vomiting 01/21/2016   wants scopolamine patch   Right carpal tunnel syndrome 03/01/2019   SVD (spontaneous vaginal delivery)    x 4   Tingling    BOTH LOWER EXTREMETIES ALL THE TIME DUE TO RAPID WEIGHT LOSS   TOBACCO USER  10/02/2009   Qualifier: Diagnosis of  By: Carolyne Fiscal MD, Clayburn Pert     Transfusion history    last transfusion 6'16    Tremor 08/31/2018   UC (ulcerative colitis) (HCC)    supposed to be taking Lialda and Bentyl but has been off since gastric sleeve   Vertigo    doesn't take any meds   Past Surgical History:  Procedure Laterality Date   ABDOMINAL HYSTERECTOMY     PARTIAL   BIOPSY  01/20/2023   Procedure: BIOPSY;  Surgeon: Kerin Salen, MD;  Location: [facility];  Service: Gastroenterology;;   COLONOSCOPY  2007   for rectal bleeding; [facility]   COLONOSCOPY N/A 06/11/2014   Procedure: COLONOSCOPY;  Surgeon: Iva Boop, MD;   Location: [facility];  Service: Endoscopy;  Laterality: N/A;   COLONOSCOPY WITH PROPOFOL N/A 03/02/2017   Procedure: COLONOSCOPY WITH PROPOFOL;  Surgeon: Ovidio Kin, MD;  Location: [facility];  Service: General;  Laterality: N/A;   DILATION AND CURETTAGE OF UTERUS     ESOPHAGOGASTRODUODENOSCOPY (EGD) WITH PROPOFOL N/A 04/06/2016   Procedure: ESOPHAGOGASTRODUODENOSCOPY (EGD) WITH PROPOFOL;  Surgeon: Ovidio Kin, MD;  Location: [facility];  Service: General;  Laterality: N/A;   ESOPHAGOGASTRODUODENOSCOPY (EGD) WITH PROPOFOL N/A 03/02/2017   Procedure: ESOPHAGOGASTRODUODENOSCOPY (EGD) WITH PROPOFOL;  Surgeon: Ovidio Kin, MD;  Location: [facility];  Service: General;  Laterality: N/A;   ESOPHAGOGASTRODUODENOSCOPY (EGD) WITH PROPOFOL N/A 05/20/2017   Procedure: ESOPHAGOGASTRODUODENOSCOPY (EGD) WITH PROPOFOL ERAS PATHWAY;  Surgeon: Ovidio Kin, MD;  Location: [facility];  Service: General;  Laterality: N/A;   ESOPHAGOGASTRODUODENOSCOPY (EGD) WITH PROPOFOL N/A 10/07/2017   Procedure: ESOPHAGOGASTRODUODENOSCOPY (EGD) WITH PROPOFOL;  Surgeon: Ovidio Kin, MD;  Location: [facility];  Service: General;  Laterality: N/A;   ESOPHAGOGASTRODUODENOSCOPY (EGD) WITH PROPOFOL N/A 01/20/2023   Procedure: ESOPHAGOGASTRODUODENOSCOPY (EGD) WITH PROPOFOL;  Surgeon: Kerin Salen, MD;  Location: [facility];  Service: Gastroenterology;  Laterality: N/A;   EXCISION OF SKIN TAG  06/08/2017   Procedure: EXCISION OF VULVAR SKIN TAGS X2;  Surgeon: Reva Bores, MD;  Location: [facility];  Service: Gynecology;;   FOOT SURGERY Bilateral    x 2   GASTRIC ROUX-EN-Y N/A 12/14/2016   Procedure: LAPAROSCOPIC REVISION SLEEVE GASTRECTOMY TO  ROUX-Y-GASTRIC BY-PASS, UPPER ENDO;  Surgeon: Glenna Fellows, MD;  Location: [facility];  Service: General;  Laterality: N/A;   GASTROJEJUNOSTOMY N/A 05/11/2016   Procedure: LAPAROSCOPIC PLACEMENT  OF FEEDING  JEJUNOSTOMY TUBE;  Surgeon: Glenna Fellows, MD;  Location: [facility];  Service:  General;  Laterality: N/A;   HEMORRHOIDECTOMY WITH HEMORRHOID BANDING     IR FLUORO GUIDE CV LINE RIGHT  04/06/2017   IR GENERIC HISTORICAL  05/19/2016   IR CM INJ ANY COLONIC TUBE W/FLUORO 05/19/2016 Irish Lack, MD [facility]   IR PATIENT EVAL TECH 0-60 MINS  11/30/2017   IR REPLC DUODEN/JEJUNO TUBE PERCUT W/FLUORO  03/14/2018   IR REPLC DUODEN/JEJUNO TUBE PERCUT W/FLUORO  03/15/2018   IR US GUIDE VASC ACCESS RIGHT  04/06/2017   j tube removed march 2018     KNEE ARTHROSCOPY Left 09/06/2014   LAPAROSCOPIC GASTRIC SLEEVE RESECTION N/A 01/14/2016   Procedure: LAPAROSCOPIC GASTRIC SLEEVE RESECTION;  Surgeon: Glenna Fellows, MD;  Location: [facility];  Service: General;  Laterality: N/A;   LAPAROSCOPIC REVISION OF GASTROJEJUNOSTOMY N/A 10/25/2017   Procedure: LAPAROSCOPIC REVISION OF GASTROJEJUNOSTOMY AND PARTIAL GASTRECTOMY, WITH PLACEMENT OF FEEDING GASTROSTOMY TUBE;  Surgeon: Glenna Fellows, MD;  Location: [facility];  Service: General;  Laterality: N/A;   LAPAROSCOPIC  TUBAL LIGATION  10/16/2011   Procedure: LAPAROSCOPIC TUBAL LIGATION;  Surgeon: Esmeralda Arthur, MD;  Location: [facility];  Service: Gynecology;  Laterality: Bilateral;   NOVASURE ABLATION  09/28/2010   mild persistent vaginal bleeding   right knee arthroscopy     05/07/2016   TOTAL KNEE ARTHROPLASTY Left 03/10/2018   Procedure: LEFT TOTAL KNEE ARTHROPLASTY;  Surgeon: Durene Romans, MD;  Location: [facility];  Service: Orthopedics;  Laterality: Left;  70 mins   TOTAL KNEE ARTHROPLASTY Right 01/23/2020   Procedure: TOTAL KNEE ARTHROPLASTY, BILATERAL TROCHANTERIC INJECTION;  Surgeon: Durene Romans, MD;  Location: [facility];  Service: Orthopedics;  Laterality: Right;  70 mins for Bilateral Troch injection 1 cc Depo and 2 CC Lidocaine   TUBAL LIGATION     VAGINAL HYSTERECTOMY Bilateral 06/08/2017   Procedure: HYSTERECTOMY VAGINAL W/ BILATERAL SALPINGECTOMY;  Surgeon: Reva Bores, MD;  Location: [facility];  Service: Gynecology;  Laterality:  Bilateral;   wisdom teeth extracted     Prior to Admission medications   Medication Sig Start Date End Date Taking? Authorizing Provider  albuterol (VENTOLIN HFA) 108 (90 Base) MCG/ACT inhaler Inhale 2 puffs into the lungs every 6 (six) hours as needed for wheezing or shortness of breath.   Yes [provider]  Atogepant (QULIPTA) 30 MG TABS Take 1 tablet (30 mg total) by mouth daily. 11/02/22  Yes Ocie Doyne, MD  atorvastatin (LIPITOR) 40 MG tablet Take 40 mg by mouth daily. 12/10/22  Yes [provider]  baclofen (LIORESAL) 10 MG tablet TAKE 1 TABLET BY MOUTH EVERYDAY AT BEDTIME Patient taking differently: Take 10 mg by mouth in the morning, at noon, and at bedtime. Take 1 tablet (10 mg) in morning, noon and Take 2 tablets (20 mg) at bedtime 12/18/20  Yes Glean Salvo, NP  buPROPion (WELLBUTRIN XL) 150 MG 24 hr tablet Take 150 mg by mouth every morning. Take along with 300 mg=450 mg   Yes [provider]  buPROPion (WELLBUTRIN XL) 300 MG 24 hr tablet Take 300 mg by mouth every morning. Take along with 150 mg=450 mg 06/05/20  Yes [provider]  busPIRone (BUSPAR) 15 MG tablet Take 15 mg by mouth 3 (three) times daily.   Yes [provider]  Cholecalciferol (VITAMIN D3) 1.25 MG (50000 UT) CAPS Take 50,000 Units by mouth every 7 (seven) days.  04/03/19  Yes [provider]  cyanocobalamin (,VITAMIN B-12,) 1000 MCG/ML injection Inject 1,000 mcg into the muscle 2 (two) times a week.  04/06/19  Yes [provider]  diclofenac Sodium (VOLTAREN) 1 % GEL Apply topically 4 (four) times daily.   Yes [provider]  dicyclomine (BENTYL) 10 MG capsule Take 10 mg by mouth 2 (two) times daily as needed for spasms. 12/17/20  Yes [provider]  DULoxetine (CYMBALTA) 60 MG capsule Take 1 capsule (60 mg total) by mouth 2 (two) times daily. 04/10/19  Yes Aldean Baker, NP  EPINEPHrine 0.3 mg/0.3 mL IJ SOAJ injection Inject 0.3 mg into  the muscle as needed for anaphylaxis. 04/12/21  Yes Haskel Schroeder, PA-C  ferrous sulfate 325 (65 FE) MG tablet Take 1 tablet (325 mg total) by mouth 2 (two) times daily with a meal. Patient taking differently: Take 325 mg by mouth daily with breakfast. 01/24/20  Yes Cassandria Anger, PA-C  fluticasone (FLONASE) 50 MCG/ACT nasal spray Place 2 sprays into both nostrils daily as needed for allergies or rhinitis.   Yes [provider]  gabapentin (NEURONTIN) 400 MG capsule Take 400 mg by mouth 3 (three) times daily.   Yes [provider]  hydrocortisone (ANUSOL-HC) 2.5 % rectal cream Place 1 Application rectally daily as needed for hemorrhoids. 07/29/18  Yes [provider]  HYDROmorphone (DILAUDID) 4 MG tablet Take 1 tablet (4 mg total) by mouth every 3 (three) hours. 01/07/23  Yes   linaclotide (LINZESS) 290 MCG CAPS capsule Take 290 mcg by mouth daily before breakfast.   Yes [provider]  magnesium oxide (MAG-OX) 400 (240 Mg) MG tablet Take 1 tablet by mouth daily. 12/19/22  Yes [provider]  meclizine (ANTIVERT) 25 MG tablet Take 25 mg by mouth 3 (three) times daily. 04/15/22  Yes [provider]  mesalamine (LIALDA) 1.2 g EC tablet Take 2.4 g by mouth daily with breakfast.   Yes [provider]  mirtazapine (REMERON) 15 MG tablet Take 15 mg by mouth at bedtime as needed (sleep).   Yes [provider]  Multiple Vitamin (MULTIVITAMIN WITH MINERALS) TABS tablet Take 1 tablet by mouth daily.   Yes [provider]  NARCAN 4 MG/0.1ML LIQD nasal spray kit Place 1 spray into the nose daily as needed (overdose). 03/18/20  Yes [provider]  ondansetron (ZOFRAN) 8 MG tablet Take 8 mg by mouth every 8 (eight) hours as needed for nausea or vomiting.   Yes [provider]  oxybutynin (DITROPAN) 5 MG tablet Take 5 mg by mouth daily.   Yes [provider]  OZEMPIC, 2 MG/DOSE, 8 MG/3ML SOPN Inject 2  mg into the skin once a week.   Yes [provider]  pantoprazole (PROTONIX) 40 MG tablet TAKE 1 TABLET BY MOUTH TWICE A DAY BEFORE A MEAL 02/22/20  Yes Iva Boop, MD  phentermine (ADIPEX-P) 37.5 MG tablet Take 37.5 mg by mouth daily before breakfast.   Yes [provider]  sucralfate (CARAFATE) 1 g tablet Take 1 tablet (1 g total) by mouth every 6 (six) hours. 01/21/23  Yes Arnetha Courser, MD  SUMAtriptan (IMITREX) 50 MG tablet Take 1 tablet (50 mg total) by mouth every 2 (two) hours as needed for migraine. May repeat in 2 hours if headache persists or recurs. 11/02/22  Yes Ocie Doyne, MD  SYMPROIC 0.2 MG TABS Take 1 tablet by mouth daily. 05/13/21  Yes [provider]  thiamine (VITAMIN B-1) 100 MG tablet Take 100 mg by mouth daily at 12 noon.    Yes [provider]  torsemide (DEMADEX) 10 MG tablet Take 10 mg by mouth daily. 11/10/21  Yes [provider]  valACYclovir (VALTREX) 1000 MG tablet Take 1,000 mg by mouth 2 (two) times daily.   Yes [provider]  botulinum toxin Type A (BOTOX) 200 units injection INJECT 155 UNITS INTRAMUSCULARLY INTO HEAD AND NECK MUSCLES EVERY 3 MONTHS 11/23/22   Levert Feinstein, MD  LORazepam (ATIVAN) 1 MG tablet Take 1 mg by mouth 2 (two) times daily as needed for anxiety.    [provider]   Current Facility-Administered Medications  Medication Dose Route Frequency Provider Last Rate Last Admin   Atogepant TABS 30 mg  30 mg Oral Daily Patel, Vishal R, MD       atorvastatin (LIPITOR) tablet 40 mg  40 mg Oral Daily Darreld Mclean R, MD   40 mg at 01/26/23 0848   baclofen (LIORESAL) tablet 10 mg  10 mg Oral BID WC Charlsie Quest, MD   10 mg at 01/26/23 1117  baclofen (LIORESAL) tablet 20 mg  20 mg Oral QHS Darreld Mclean R, MD   20 mg at 01/26/23 0116   buPROPion (WELLBUTRIN XL) 24 hr tablet 150 mg  150 mg Oral q morning Darreld Mclean R, MD   150 mg at 01/26/23 0851   buPROPion (WELLBUTRIN XL) 24 hr  tablet 300 mg  300 mg Oral q morning Darreld Mclean R, MD   300 mg at 01/26/23 0847   busPIRone (BUSPAR) tablet 15 mg  15 mg Oral TID Charlsie Quest, MD   15 mg at 01/26/23 0848   cefTRIAXone (ROCEPHIN) 2 g in sodium chloride 0.9 % 100 mL IVPB  2 g Intravenous Q24H Standley Brooking, MD       DULoxetine (CYMBALTA) DR capsule 60 mg  60 mg Oral BID Charlsie Quest, MD   60 mg at 01/26/23 0849   gabapentin (NEURONTIN) capsule 400 mg  400 mg Oral TID Charlsie Quest, MD   400 mg at 01/26/23 0848   HYDROmorphone (DILAUDID) injection 1 mg  1 mg Intravenous Q3H PRN Darreld Mclean R, MD   1 mg at 01/26/23 1124   linaclotide (LINZESS) capsule 290 mcg  290 mcg Oral QAC breakfast Darreld Mclean R, MD   290 mcg at 01/26/23 0850   LORazepam (ATIVAN) tablet 1 mg  1 mg Oral BID PRN Charlsie Quest, MD       mesalamine (LIALDA) EC tablet 2.4 g  2.4 g Oral Q breakfast Darreld Mclean R, MD   2.4 g at 01/26/23 0850   metroNIDAZOLE (FLAGYL) IVPB 500 mg  500 mg Intravenous Q12H Standley Brooking, MD       mirtazapine (REMERON) tablet 15 mg  15 mg Oral QHS PRN Charlsie Quest, MD       Naldemedine Tosylate TABS 1 tablet  1 tablet Oral Daily Charlsie Quest, MD       ondansetron (ZOFRAN) tablet 4 mg  4 mg Oral Q6H PRN Charlsie Quest, MD       Or   ondansetron [facility]) injection 4 mg  4 mg Intravenous Q6H PRN Darreld Mclean R, MD   4 mg at 01/26/23 1117   pantoprazole (PROTONIX) injection 40 mg  40 mg Intravenous Q12H Darreld Mclean R, MD   40 mg at 01/26/23 0847   sucralfate (CARAFATE) tablet 1 g  1 g Oral Q6H Darreld Mclean R, MD   1 g at 01/26/23 1117   Allergies as of 01/25/2023 - Review Complete 01/25/2023  Allergen Reaction Noted   Coconut (cocos nucifera) Anaphylaxis and Itching 06/14/2017   Coconut oil Anaphylaxis, Itching, and Other (See Comments) 06/14/2017   Morphine and related Anaphylaxis and Hives 01/14/2016   Oxycodone Anaphylaxis 09/02/2011   Oxycodone-acetaminophen Anaphylaxis 10/08/2021    Ciprofloxacin Itching and Rash 04/20/2012   Penicillamine Hives and Other (See Comments) 08/02/2018   Trazodone and nefazodone Rash 04/10/2019   Vistaril [hydroxyzine hcl] Hives 04/10/2019   Doxycycline Nausea And Vomiting 09/02/2011   Silicone  06/11/2022   Penicillins Rash 09/02/2011   Tape Rash 01/31/2017   Family History  Problem Relation Age of Onset   Hypertension Mother    Diabetes Mother    Sarcoidosis Mother        lungs and skin   Asthma Mother    Hypertension Father    Diabetes Father    Asthma Son    Tics Son    Asthma Sister    Cancer Sister  possible pancreatic cancer   Adrenal disorder Sister        Tumor    Asthma Brother    Asthma Daughter        died age 61.5   Cancer Daughter 4       brain; died age 82.5   Asthma Son    Cancer Paternal Aunt        brain, colon, lung and esophagus; unsure of primary    Breast cancer Paternal Aunt    Social History   Socioeconomic History   Marital status: Single    Spouse name: Not on file   Number of children: 3   Years of education: Some college   Highest education level: Not on file  Occupational History   Occupation: English as a second language teacher    Comment: Currently out on disability d/t surg  Tobacco Use   Smoking status: Former    Packs/day: 0.50    Years: 20.00    Additional pack years: 0.00    Total pack years: 10.00    Types: Cigarettes    Quit date: 03/17/2014    Years since quitting: 8.8   Smokeless tobacco: Never  Vaping Use   Vaping Use: Never used  Substance and Sexual Activity   Alcohol use: Yes    Alcohol/week: 0.0 standard drinks of alcohol   Drug use: No   Sexual activity: Not Currently    Birth control/protection: Surgical, Abstinence  Other Topics Concern   Not on file  Social History Narrative   Lives with 2 sons   Right Handed   Drinks caffeine2-3 cups daily   Social Determinants of Health   Financial Resource Strain: Low Risk  (04/08/2019)   Overall Financial Resource Strain  (CARDIA)    Difficulty of Paying Living Expenses: Not hard at all  Food Insecurity: No Food Insecurity (01/26/2023)   Hunger Vital Sign    Worried About Running Out of Food in the Last Year: Never true    Ran Out of Food in the Last Year: Never true  Transportation Needs: No Transportation Needs (01/26/2023)   PRAPARE - Administrator, Civil Service (Medical): No    Lack of Transportation (Non-Medical): No  Physical Activity: Inactive (04/08/2019)   Exercise Vital Sign    Days of Exercise per Week: 0 days    Minutes of Exercise per Session: 0 min  Stress: Stress Concern Present (04/08/2019)   [employer] of Occupational Health - Occupational Stress Questionnaire    Feeling of Stress : To some extent  Social Connections: Socially Isolated (04/08/2019)   Social Connection and Isolation Panel [NHANES]    Frequency of Communication with Friends and Family: Once a week    Frequency of Social Gatherings with Friends and Family: Once a week    Attends Religious Services: Never    Database administrator or Organizations: No    Attends Engineer, structural: Not asked    Marital Status: Separated  Intimate Partner Violence: Not At Risk (01/26/2023)   Humiliation, Afraid, Rape, and Kick questionnaire    Fear of Current or Ex-Partner: No    Emotionally Abused: No    Physically Abused: No    Sexually Abused: No   Review of Systems:  Review of Systems  Constitutional:  Positive for fever. Negative for chills.  Respiratory:  Negative for shortness of breath.   Cardiovascular:  Positive for chest pain.  Gastrointestinal:  Positive for abdominal pain, blood in stool, constipation, nausea and vomiting.  OBJECTIVE:   Temp:  [98.4 F (36.9 C)-99 F (37.2 C)] 98.4 F (36.9 C) (04/30 1319) Pulse Rate:  [76-98] 81 (04/30 1319) Resp:  [0-21] 16 (04/30 0422) BP: (117-154)/(67-97) 135/84 (04/30 1319) SpO2:  [97 %-100 %] 100 % (04/30 1319) Weight:  [75.6 kg] 75.6 kg (04/30  0132) Last BM Date : 01/25/23 Physical Exam Constitutional:      General: She is not in acute distress.    Appearance: She is not ill-appearing, toxic-appearing or diaphoretic.  Cardiovascular:     Rate and Rhythm: Normal rate and regular rhythm.  Pulmonary:     Effort: No respiratory distress.     Breath sounds: Normal breath sounds.  Abdominal:     General: Bowel sounds are normal. There is no distension.     Palpations: Abdomen is soft.     Tenderness: There is abdominal tenderness (Left lower quadrant, right lower quadrant, left upper quadrant). There is no guarding.  Skin:    General: Skin is warm and dry.  Neurological:     Mental Status: She is alert.     Labs: Recent Labs    01/25/23 2057 01/26/23 0112  WBC 12.0* 12.0*  HGB 10.6* 10.6*  HCT 33.2* 34.1*  PLT 503* 443*   BMET Recent Labs    01/25/23 2057 01/26/23 0112  NA 137 137  K 3.4* 3.6  CL 101 102  CO2 26 24  GLUCOSE 95 82  BUN 13 12  CREATININE 0.70 0.71  CALCIUM 8.9 8.6*   LFT Recent Labs    01/25/23 2057  PROT 6.4*  ALBUMIN 3.0*  AST 11*  ALT 8  ALKPHOS 56  BILITOT 0.5   PT/INR No results for input(s): "LABPROT", "INR" in the last 72 hours.  Diagnostic imaging: CT ABDOMEN PELVIS W CONTRAST  Result Date: 01/25/2023 CLINICAL DATA:  Acute abdominal pain EXAM: CT ABDOMEN AND PELVIS WITH CONTRAST TECHNIQUE: Multidetector CT imaging of the abdomen and pelvis was performed using the standard protocol following bolus administration of intravenous contrast. RADIATION DOSE REDUCTION: This exam was performed according to the departmental dose-optimization program which includes automated exposure control, adjustment of the mA and/or kV according to patient size and/or use of iterative reconstruction technique. CONTRAST:  OMNIPAQUE IOHEXOL 300 MG/ML  SOLN COMPARISON:  CT abdomen and pelvis 01/17/2023 FINDINGS: Lower chest: There is a new trace left pleural effusion. Hepatobiliary: No focal liver  abnormality is seen. No gallstones, gallbladder wall thickening, or biliary dilatation. Pancreas: Unremarkable. No pancreatic ductal dilatation or surrounding inflammatory changes. 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 Spleen is normal in size. Adrenals/Urinary Tract: Adrenal glands are unremarkable. Kidneys are normal, without renal calculi, focal lesion, or hydronephrosis. Bladder is unremarkable. Stomach/Bowel: Patient is status post gastric bypass. Again seen is wall thickening and edema of the small bowel loop extending from the gastrojejunal anastomosis. There is increased inflammatory stranding at the level of the anastomosis. There is no evidence for bowel obstruction, pneumatosis or free air. The appendix is not visualized. Vascular/Lymphatic: No significant vascular findings are present. No enlarged abdominal or pelvic lymph nodes. Reproductive: Status post hysterectomy. No adnexal masses. Other: There is new trace free fluid in the left upper quadrant and pelvis. No focal abdominal wall hernia. Musculoskeletal: Left-sided sacral stimulator device again seen. No acute osseous abnormality. IMPRESSION: 1. Status post gastric bypass. Again seen is wall thickening and edema of the small bowel loop extending from the gastrojejunal anastomosis. There is increased inflammatory stranding at the level of  the anastomosis. Findings are concerning for enteritis. No bowel obstruction or free air. 2. New trace free fluid in the left upper quadrant and pelvis. 3. New trace left pleural effusion. Electronically Signed   By: Darliss Cheney M.D.   On: 01/25/2023 23:12    IMPRESSION: Intractable nausea and nonbloody vomiting Status post bariatric surgery with revision and gastric bypass in 2021 Clean-based gastric anastomotic and jejunal ulcerations on EGD 01/20/2023 Ulcerative colitis diagnosed 2015, active disease at appendiceal orifice on colonoscopy 2019 Chronic constipation, suspect from opiate  use  PLAN: -Recommend oral PPI twice daily, Carafate 4 times daily -No plan for repeat EGD -Recommend small bowel follow-through given her history of bariatric surgery and gastric bypass -Check fecal calprotectin -Needs eventual colonoscopy, this can be coordinated for her in the outpatient setting -Antiemetic therapy -Diet as tolerated -[facility] will follow   LOS: 0 days   Liliane Shi, [facility]

## 2023-01-26 NOTE — Progress Notes (Signed)
  Progress Note   Patient: [name] WUJ:[id number] DOB: 05/03/1976 DOA: 01/25/2023     0 DOS: the patient was seen and examined on 01/26/2023   Brief hospital course: 47 year old woman PMH including ulcerative colitis, gastric bypass iron deficiency, B12 deficiency recent admission for GI bleed secondary to gastrojejunal anastomotic ulcers, who presented with recurrent abdominal pain with nausea and vomiting as well as reported blood in stool.  Assessment and Plan: Abdominal pain with nausea and vomiting Possible enteritis Recent UGI bleed due to gastrojejunal/anastomotic ulcers PMH gastric bypass: CT showed wall thickening from gastrojejunal anastomosis with inflammatory stranding increased.  Concern for enteritis.   Reports nausea but would like to try some liquids.   Mild generalized tenderness on exam.  Given persistence of inflammatory findings suggesting colitis will initiate empiric antibiotics. GI consultation pending. Once able to advance diet, use for small frequent meals Continue PPI twice daily, Carafate 4 times daily   Ulcerative colitis: No report of colonic involvement on CT.  Continue mesalamine    Normocytic anemia History of iron and B12 deficiency: Hemoglobin improved and stable at 10.6.   Hypokalemia: Resolved   Chronic migraine: Continue Qulipta.   Depression/anxiety: Continue Wellbutrin, BuSpar, Cymbalta, Remeron.  Continue home Ativan as needed.      Subjective:   Some nausea Would like to try liquids Still some epigastric abdominal pain and generalized abdominal pain  Physical Exam: Vitals:   01/26/23 0057 01/26/23 0132 01/26/23 0422 01/26/23 1319  BP: (!) 140/85  117/67 135/84  Pulse: 77  89 81  Resp: 14  16   Temp: 98.7 F (37.1 C)  98.7 F (37.1 C) 98.4 F (36.9 C)  TempSrc: Oral  Oral Oral  SpO2: 100%  97% 100%  Weight:  75.6 kg    Height:  5\' 7"  (1.702 m)     Physical Exam Vitals reviewed.  Constitutional:      General:  She is not in acute distress.    Appearance: She is not ill-appearing or toxic-appearing.  Cardiovascular:     Rate and Rhythm: Normal rate and regular rhythm.     Heart sounds: No murmur heard. Pulmonary:     Effort: Pulmonary effort is normal. No respiratory distress.     Breath sounds: No wheezing, rhonchi or rales.  Abdominal:     General: There is no distension.     Palpations: Abdomen is soft.     Tenderness: There is abdominal tenderness (Mild epigastric and generalized tenderness).  Neurological:     Mental Status: She is alert.  Psychiatric:        Mood and Affect: Mood normal.        Behavior: Behavior normal.     Data Reviewed: BMP noted CRP trivial elevation WBC stable 12.0 Hgb stable 10.6 CT showed wall thickening and edema SB loop, increased inflammatory stranding at level of anastomosis, findings concerning for enteritis.  Family Communication: none  Disposition: Status is: Observation   Planned Discharge Destination: Home    Time spent: 35 minutes  Author: Brendia Sacks, MD 01/26/2023 2:04 PM  For on call review [email or web address].

## 2023-01-26 NOTE — H&P (Signed)
History and Physical    [name] UJW:[id number] DOB: October 24, 1975 DOA: 01/25/2023  PCP: Felix Pacini, FNP  Patient coming from: Home  I have personally briefly reviewed patient's old medical records in [facility]  Chief Complaint: Abdominal pain, nausea, vomiting  HPI: [name] is a 47 y.o. female with medical history significant for ulcerative colitis, history of gastric bypass, iron and B12 deficiency, chronic migraine, depression/anxiety, recent admission for upper GI bleed due to to gastrojejunal/anastomotic ulcers who presented to the ED for evaluation of recurrent abdominal pain with nausea and vomiting.  Patient was recently admitted 01/17/2023-01/21/2023 with upper GI bleed.  She underwent EGD 4/24 which showed nonbleeding gastric congenital ulcers.  Esophageal plaques were seen and biopsied, pathology negative for H. pylori or Candida.  Patient required 1 unit PRBC transfusion during admission.  Patient was discharged on PPI BID indefinitely and sucralfate 4 times a day for 4 weeks.  Patient states that she did well on discharge until this past Friday 4/26 when she developed recurrent abdominal pain.  Pain is generalized but more severe in the epigastric and left lower quadrant regions.  Has been constant.  She has had associated nausea and vomiting and has not been able to maintain adequate oral intake.  She says she has seen blood in her stools since Friday as well.  She showed me a picture of dark red and dark black appearing stool she has been taking her sucralfate and PPI as prescribed. She denies NSAID use.  ED Course  Labs/Imaging on admission: I have personally reviewed following labs and imaging studies.  Initial vitals showed BP 142/88, pulse 98, RR 16, temp 99.0 F, SpO2 100% on room air.  Labs show WBC 12.0, hemoglobin 10.6, platelets 503,000, sodium 137, potassium 3.4, bicarb 26, BUN 13, creatinine 0.70, serum glucose 95, lipase 21.  CT  abdomen/pelvis with contrast shows prior gastric bypass changes.  Wall thickening and edema of the small bowel loop extending from gastrojejunal anastomosis and increased inflammatory stranding at the level of the anastomosis again seen, concerning for enteritis.  No bowel obstruction or free air.  New trace left pleural effusion and free fluid in the LUQ and pelvis noted.  Patient was given 1 L normal saline, IV Protonix 40 mg, IV fentanyl, oral Zofran, IV droperidol 1.25 mg.  The hospitalist service was consulted to admit for further evaluation and management.  Review of Systems: All systems reviewed and are negative except as documented in history of present illness above.   Past Medical History:  Diagnosis Date   Anal fissure - posterior 10/16/2014    OCC  ISSUES   Anxiety    doesn't take anything   Asthma    has Albuterol inhaler as needed   Boil    on pubic area started septra 03-01-17 draining blood and pus   Bronchitis    Chronic headache disorder 07/22/2016   Clostridium difficile infection 04/20/2012   Colitis    Dehydration    Depression    Family history of adverse reaction to anesthesia    pt mom gets sick   GERD (gastroesophageal reflux disease)    takes Pantoprazole daily   Headache    Hiatal hernia    neuropathy - mild in arms and legs   History of blood transfusion    last transfusion was 04/04/2016=Benadryl was given d/t itching. States she always itches with transfusion.    History of bronchitis    > 2 yrs ago  History of colon polyps    benign   History of migraine    last one 05/01/16   History of stomach ulcers    History of urinary tract infection LAST 2 WEEKS AGO   IDA (iron deficiency anemia)    Internal and external bleeding hemorrhoids 06/11/2014   Joint pain    Joint swelling    Left sided chronic colitis - segmental 06/11/2014   Marginal ulcer 10/25/2017   Migraine    Motion sickness    Nausea    takes Zofran as needed   Nausea and vomiting     for 1 year   Obesity    Oligouria    Osteoarthritis    lower back, knees, wrists - no meds   Peripheral neuropathy 03/01/2019   Pneumonia 1997   Postoperative nausea and vomiting 01/21/2016   wants scopolamine patch   Right carpal tunnel syndrome 03/01/2019   SVD (spontaneous vaginal delivery)    x 4   Tingling    BOTH LOWER EXTREMETIES ALL THE TIME DUE TO RAPID WEIGHT LOSS   TOBACCO USER 10/02/2009   Qualifier: Diagnosis of  By: Carolyne Fiscal MD, Clayburn Pert     Transfusion history    last transfusion 6'16    Tremor 08/31/2018   UC (ulcerative colitis) (HCC)    supposed to be taking Lialda and Bentyl but has been off since gastric sleeve   Vertigo    doesn't take any meds    Past Surgical History:  Procedure Laterality Date   ABDOMINAL HYSTERECTOMY     PARTIAL   BIOPSY  01/20/2023   Procedure: BIOPSY;  Surgeon: Kerin Salen, MD;  Location: [facility];  Service: Gastroenterology;;   COLONOSCOPY  2007   for rectal bleeding; [facility]   COLONOSCOPY N/A 06/11/2014   Procedure: COLONOSCOPY;  Surgeon: Iva Boop, MD;  Location: [facility];  Service: Endoscopy;  Laterality: N/A;   COLONOSCOPY WITH PROPOFOL N/A 03/02/2017   Procedure: COLONOSCOPY WITH PROPOFOL;  Surgeon: Ovidio Kin, MD;  Location: [facility];  Service: General;  Laterality: N/A;   DILATION AND CURETTAGE OF UTERUS     ESOPHAGOGASTRODUODENOSCOPY (EGD) WITH PROPOFOL N/A 04/06/2016   Procedure: ESOPHAGOGASTRODUODENOSCOPY (EGD) WITH PROPOFOL;  Surgeon: Ovidio Kin, MD;  Location: [facility];  Service: General;  Laterality: N/A;   ESOPHAGOGASTRODUODENOSCOPY (EGD) WITH PROPOFOL N/A 03/02/2017   Procedure: ESOPHAGOGASTRODUODENOSCOPY (EGD) WITH PROPOFOL;  Surgeon: Ovidio Kin, MD;  Location: [facility];  Service: General;  Laterality: N/A;   ESOPHAGOGASTRODUODENOSCOPY (EGD) WITH PROPOFOL N/A 05/20/2017   Procedure: ESOPHAGOGASTRODUODENOSCOPY (EGD) WITH PROPOFOL ERAS PATHWAY;  Surgeon: Ovidio Kin, MD;  Location: [facility];   Service: General;  Laterality: N/A;   ESOPHAGOGASTRODUODENOSCOPY (EGD) WITH PROPOFOL N/A 10/07/2017   Procedure: ESOPHAGOGASTRODUODENOSCOPY (EGD) WITH PROPOFOL;  Surgeon: Ovidio Kin, MD;  Location: [facility];  Service: General;  Laterality: N/A;   ESOPHAGOGASTRODUODENOSCOPY (EGD) WITH PROPOFOL N/A 01/20/2023   Procedure: ESOPHAGOGASTRODUODENOSCOPY (EGD) WITH PROPOFOL;  Surgeon: Kerin Salen, MD;  Location: [facility];  Service: Gastroenterology;  Laterality: N/A;   EXCISION OF SKIN TAG  06/08/2017   Procedure: EXCISION OF VULVAR SKIN TAGS X2;  Surgeon: Reva Bores, MD;  Location: [facility];  Service: Gynecology;;   FOOT SURGERY Bilateral    x 2   GASTRIC ROUX-EN-Y N/A 12/14/2016   Procedure: LAPAROSCOPIC REVISION SLEEVE GASTRECTOMY TO  ROUX-Y-GASTRIC BY-PASS, UPPER ENDO;  Surgeon: Glenna Fellows, MD;  Location: [facility];  Service: General;  Laterality: N/A;   GASTROJEJUNOSTOMY N/A 05/11/2016   Procedure: LAPAROSCOPIC PLACEMENT  OF FEEDING  JEJUNOSTOMY TUBE;  Surgeon: Glenna Fellows, MD;  Location: [facility];  Service: General;  Laterality: N/A;   HEMORRHOIDECTOMY WITH HEMORRHOID BANDING     IR FLUORO GUIDE CV LINE RIGHT  04/06/2017   IR GENERIC HISTORICAL  05/19/2016   IR CM INJ ANY COLONIC TUBE W/FLUORO 05/19/2016 Irish Lack, MD [facility]   IR PATIENT EVAL TECH 0-60 MINS  11/30/2017   IR REPLC DUODEN/JEJUNO TUBE PERCUT W/FLUORO  03/14/2018   IR REPLC DUODEN/JEJUNO TUBE PERCUT W/FLUORO  03/15/2018   IR US GUIDE VASC ACCESS RIGHT  04/06/2017   j tube removed march 2018     KNEE ARTHROSCOPY Left 09/06/2014   LAPAROSCOPIC GASTRIC SLEEVE RESECTION N/A 01/14/2016   Procedure: LAPAROSCOPIC GASTRIC SLEEVE RESECTION;  Surgeon: Glenna Fellows, MD;  Location: [facility];  Service: General;  Laterality: N/A;   LAPAROSCOPIC REVISION OF GASTROJEJUNOSTOMY N/A 10/25/2017   Procedure: LAPAROSCOPIC REVISION OF GASTROJEJUNOSTOMY AND PARTIAL GASTRECTOMY, WITH PLACEMENT OF FEEDING GASTROSTOMY TUBE;  Surgeon:  Glenna Fellows, MD;  Location: [facility];  Service: General;  Laterality: N/A;   LAPAROSCOPIC TUBAL LIGATION  10/16/2011   Procedure: LAPAROSCOPIC TUBAL LIGATION;  Surgeon: Esmeralda Arthur, MD;  Location: [facility];  Service: Gynecology;  Laterality: Bilateral;   NOVASURE ABLATION  09/28/2010   mild persistent vaginal bleeding   right knee arthroscopy     05/07/2016   TOTAL KNEE ARTHROPLASTY Left 03/10/2018   Procedure: LEFT TOTAL KNEE ARTHROPLASTY;  Surgeon: Durene Romans, MD;  Location: [facility];  Service: Orthopedics;  Laterality: Left;  70 mins   TOTAL KNEE ARTHROPLASTY Right 01/23/2020   Procedure: TOTAL KNEE ARTHROPLASTY, BILATERAL TROCHANTERIC INJECTION;  Surgeon: Durene Romans, MD;  Location: [facility];  Service: Orthopedics;  Laterality: Right;  70 mins for Bilateral Troch injection 1 cc Depo and 2 CC Lidocaine   TUBAL LIGATION     VAGINAL HYSTERECTOMY Bilateral 06/08/2017   Procedure: HYSTERECTOMY VAGINAL W/ BILATERAL SALPINGECTOMY;  Surgeon: Reva Bores, MD;  Location: [facility];  Service: Gynecology;  Laterality: Bilateral;   wisdom teeth extracted      Social History:  reports that she quit smoking about 8 years ago. Her smoking use included cigarettes. She has a 10.00 pack-year smoking history. She has never used smokeless tobacco. She reports current alcohol use. She reports that she does not use drugs.  Allergies  Allergen Reactions   Coconut (Cocos Nucifera) Anaphylaxis and Itching   Coconut Oil Anaphylaxis, Itching and Other (See Comments)   Morphine And Related Anaphylaxis and Hives    Pt states that she has tolerated Norco and Dilaudid   Oxycodone Anaphylaxis   Oxycodone-Acetaminophen Anaphylaxis   Ciprofloxacin Itching and Rash   Penicillamine Hives and Other (See Comments)   Trazodone And Nefazodone Rash   Vistaril [Hydroxyzine Hcl] Hives   Doxycycline Nausea And Vomiting   Silicone    Penicillins Rash    Has patient had a PCN reaction causing immediate rash,  facial/tongue/throat swelling, SOB or lightheadedness with hypotension: Yes Has patient had a PCN reaction causing severe rash involving mucus membranes or skin necrosis: No Has patient had a PCN reaction that required hospitalization No Has patient had a PCN reaction occurring within the last 10 years: No If all of the above answers are "NO", then may proceed with Cephalosporin use.   Tape Rash    And paper tape causes a rash if wearing for a prolong period of time    Family History  Problem Relation Age of Onset  Hypertension Mother    Diabetes Mother    Sarcoidosis Mother        lungs and skin   Asthma Mother    Hypertension Father    Diabetes Father    Asthma Son    Tics Son    Asthma Sister    Cancer Sister        possible pancreatic cancer   Adrenal disorder Sister        Tumor    Asthma Brother    Asthma Daughter        died age 26.5   Cancer Daughter 4       brain; died age 40.5   Asthma Son    Cancer Paternal Aunt        brain, colon, lung and esophagus; unsure of primary    Breast cancer Paternal Aunt      Prior to Admission medications   Medication Sig Start Date End Date Taking? Authorizing Provider  albuterol (VENTOLIN HFA) 108 (90 Base) MCG/ACT inhaler Inhale 2 puffs into the lungs every 6 (six) hours as needed for wheezing or shortness of breath.   Yes [provider]  Atogepant (QULIPTA) 30 MG TABS Take 1 tablet (30 mg total) by mouth daily. 11/02/22  Yes Ocie Doyne, MD  atorvastatin (LIPITOR) 40 MG tablet Take 40 mg by mouth daily. 12/10/22  Yes [provider]  baclofen (LIORESAL) 10 MG tablet TAKE 1 TABLET BY MOUTH EVERYDAY AT BEDTIME Patient taking differently: Take 10 mg by mouth in the morning, at noon, and at bedtime. Take 1 tablet (10 mg) in morning, noon and Take 2 tablets (20 mg) at bedtime 12/18/20  Yes Glean Salvo, NP  buPROPion (WELLBUTRIN XL) 150 MG 24 hr tablet Take 150 mg by mouth every morning. Take along with 300  mg=450 mg   Yes [provider]  buPROPion (WELLBUTRIN XL) 300 MG 24 hr tablet Take 300 mg by mouth every morning. Take along with 150 mg=450 mg 06/05/20  Yes [provider]  busPIRone (BUSPAR) 15 MG tablet Take 15 mg by mouth 3 (three) times daily.   Yes [provider]  Cholecalciferol (VITAMIN D3) 1.25 MG (50000 UT) CAPS Take 50,000 Units by mouth every 7 (seven) days.  04/03/19  Yes [provider]  cyanocobalamin (,VITAMIN B-12,) 1000 MCG/ML injection Inject 1,000 mcg into the muscle 2 (two) times a week.  04/06/19  Yes [provider]  diclofenac Sodium (VOLTAREN) 1 % GEL Apply topically 4 (four) times daily.   Yes [provider]  dicyclomine (BENTYL) 10 MG capsule Take 10 mg by mouth 2 (two) times daily as needed for spasms. 12/17/20  Yes [provider]  DULoxetine (CYMBALTA) 60 MG capsule Take 1 capsule (60 mg total) by mouth 2 (two) times daily. 04/10/19  Yes Aldean Baker, NP  EPINEPHrine 0.3 mg/0.3 mL IJ SOAJ injection Inject 0.3 mg into the muscle as needed for anaphylaxis. 04/12/21  Yes Haskel Schroeder, PA-C  ferrous sulfate 325 (65 FE) MG tablet Take 1 tablet (325 mg total) by mouth 2 (two) times daily with a meal. Patient taking differently: Take 325 mg by mouth daily with breakfast. 01/24/20  Yes Cassandria Anger, PA-C  fluticasone (FLONASE) 50 MCG/ACT nasal spray Place 2 sprays into both nostrils daily as needed for allergies or rhinitis.   Yes [provider]  gabapentin (NEURONTIN) 400 MG capsule Take 400 mg by mouth 3 (three) times daily.   Yes [provider]  hydrocortisone (ANUSOL-HC) 2.5 % rectal cream Place 1 Application rectally daily as needed for hemorrhoids. 07/29/18  Yes [provider]  HYDROmorphone (DILAUDID) 4 MG tablet Take 1 tablet (4 mg total) by mouth every 3 (three) hours. 01/07/23  Yes   linaclotide (LINZESS) 290 MCG CAPS capsule Take 290 mcg by mouth daily before breakfast.    Yes [provider]  magnesium oxide (MAG-OX) 400 (240 Mg) MG tablet Take 1 tablet by mouth daily. 12/19/22  Yes [provider]  meclizine (ANTIVERT) 25 MG tablet Take 25 mg by mouth 3 (three) times daily. 04/15/22  Yes [provider]  mesalamine (LIALDA) 1.2 g EC tablet Take 2.4 g by mouth daily with breakfast.   Yes [provider]  mirtazapine (REMERON) 15 MG tablet Take 15 mg by mouth at bedtime as needed (sleep).   Yes [provider]  Multiple Vitamin (MULTIVITAMIN WITH MINERALS) TABS tablet Take 1 tablet by mouth daily.   Yes [provider]  NARCAN 4 MG/0.1ML LIQD nasal spray kit Place 1 spray into the nose daily as needed (overdose). 03/18/20  Yes [provider]  ondansetron (ZOFRAN) 8 MG tablet Take 8 mg by mouth every 8 (eight) hours as needed for nausea or vomiting.   Yes [provider]  oxybutynin (DITROPAN) 5 MG tablet Take 5 mg by mouth daily.   Yes [provider]  OZEMPIC, 2 MG/DOSE, 8 MG/3ML SOPN Inject 2 mg into the skin once a week.   Yes [provider]  pantoprazole (PROTONIX) 40 MG tablet TAKE 1 TABLET BY MOUTH TWICE A DAY BEFORE A MEAL 02/22/20  Yes Iva Boop, MD  phentermine (ADIPEX-P) 37.5 MG tablet Take 37.5 mg by mouth daily before breakfast.   Yes [provider]  sucralfate (CARAFATE) 1 g tablet Take 1 tablet (1 g total) by mouth every 6 (six) hours. 01/21/23  Yes Arnetha Courser, MD  SUMAtriptan (IMITREX) 50 MG tablet Take 1 tablet (50 mg total) by mouth every 2 (two) hours as needed for migraine. May repeat in 2 hours if headache persists or recurs. 11/02/22  Yes Ocie Doyne, MD  SYMPROIC 0.2 MG TABS Take 1 tablet by mouth daily. 05/13/21  Yes [provider]  thiamine (VITAMIN B-1) 100 MG tablet Take 100 mg by mouth daily at 12 noon.    Yes [provider]  torsemide (DEMADEX) 10 MG tablet Take 10 mg by mouth daily. 11/10/21  Yes [provider]  valACYclovir (VALTREX) 1000 MG tablet Take 1,000 mg by mouth 2 (two) times daily.   Yes [provider]  botulinum toxin Type A (BOTOX) 200 units injection INJECT 155 UNITS INTRAMUSCULARLY INTO HEAD AND NECK MUSCLES EVERY 3 MONTHS 11/23/22   Levert Feinstein, MD  LORazepam (ATIVAN) 1 MG tablet Take 1 mg by mouth 2 (two) times daily as needed for anxiety.    [provider]    Physical Exam: Vitals:   01/26/23 0015 01/26/23 0020 01/26/23 0025 01/26/23 0030  BP: (!) 145/88   (!) 149/97  Pulse: 81 77 81 83  Resp: 17 (!) 0 11 13  Temp:    99 F (37.2 C)  TempSrc:    Oral  SpO2: 100% 100% 100% 100%   Constitutional: Resting in bed in the left lateral decubitus position, appears fatigued Eyes: EOMI, lids and conjunctivae normal ENMT: Mucous membranes are moist. Posterior pharynx clear of any exudate or lesions.Normal dentition.  Neck: normal, supple, no masses. Respiratory:  clear to auscultation bilaterally, no wheezing, no crackles. Normal respiratory effort. No accessory muscle use.  Cardiovascular: Regular rate and rhythm, no murmurs / rubs / gallops. No extremity edema. 2+ pedal pulses. Abdomen: Generalized tenderness more prominent in the epigastric and left lower quadrant regions, no masses palpated.  Musculoskeletal: no clubbing / cyanosis. No joint deformity upper and lower extremities. Good ROM, no contractures. Normal muscle tone.  Skin: no rashes, lesions, ulcers. No induration Neurologic: Sensation intact. Strength 5/5 in all 4.  Psychiatric: Alert and oriented x 3. Normal mood.   EKG: Not performed.  Assessment/Plan Principal Problem:   Abdominal pain with vomiting Active Problems:   Normocytic anemia   History of gastrojejunal ulcer   S/P laparoscopic sleeve gastrectomy   Chronic headache disorder   Depression with anxiety   [name] is a 47 y.o. female with medical history significant for ulcerative colitis, history of gastric  bypass, iron and B12 deficiency, chronic migraine, depression/anxiety, recent admission for upper GI bleed due to to gastrojejunal/anastomotic ulcers who is admitted with recurrent abdominal pain with nausea and vomiting.  Assessment and Plan: Abdominal pain with nausea and vomiting Recent UGI bleed due to gastrojejunal/anastomotic ulcers History of gastric bypass: Presenting with recurrent abdominal pain, nausea, vomiting.  Recent admission for UGI bleed found to have gastrojejunal ulcers.  CT A/P again shows similar wall thickening from gastrojejunal anastomosis with inflammatory stranding at the level.  Concern for enteritis.  No bowel obstruction or free air.  Patient reports seeing blood in stool at home. -Keep n.p.o. -Continue IV Protonix 40 mg twice daily -Continue IV fluid hydration -Continue sucralfate -Analgesics as needed  Ulcerative colitis: No report of colonic involvement on CT.  Continue Lialda.  Normocytic anemia History of iron and B12 deficiency: Hemoglobin improved and stable at 10.6.  Hypokalemia: Add supplement to IV fluids.  Chronic migraine: Continue Qulipta.  Depression/anxiety: Continue Wellbutrin, BuSpar, Cymbalta, Remeron.  Continue home Ativan as needed.   DVT prophylaxis: SCDs Start: 01/26/23 0038 Code Status: Full code Family Communication: Son at bedside Disposition Plan: From home, dispo pending clinical progress Consults called: EDP messaged [facility] Severity of Illness: The appropriate patient status for this patient is OBSERVATION. Observation status is judged to be reasonable and necessary in order to provide the required intensity of service to ensure the patient's safety. The patient's presenting symptoms, physical exam findings, and initial radiographic and laboratory data in the context of their medical condition is felt to place them at decreased risk for further clinical deterioration. Furthermore, it is anticipated that the patient will be  medically stable for discharge from the hospital within 2 midnights of admission.   Darreld Mclean MD [facility]  If 7PM-7AM, please contact night-coverage [email or web address]  01/26/2023, 12:52 AM

## 2023-01-26 NOTE — Hospital Course (Addendum)
47 year old woman PMH including ulcerative colitis, gastric bypass iron deficiency, B12 deficiency recent admission for GI bleed secondary to gastrojejunal anastomotic ulcers, who presented with recurrent abdominal pain with nausea and vomiting as well as reported blood in stool.

## 2023-01-27 ENCOUNTER — Inpatient Hospital Stay (HOSPITAL_COMMUNITY): Payer: 59

## 2023-01-27 DIAGNOSIS — R109 Unspecified abdominal pain: Secondary | ICD-10-CM | POA: Diagnosis not present

## 2023-01-27 DIAGNOSIS — R1110 Vomiting, unspecified: Secondary | ICD-10-CM | POA: Diagnosis not present

## 2023-01-27 LAB — CBC
HCT: 28.7 % — ABNORMAL LOW (ref 36.0–46.0)
Hemoglobin: 9 g/dL — ABNORMAL LOW (ref 12.0–15.0)
MCH: 30.8 pg (ref 26.0–34.0)
MCHC: 31.4 g/dL (ref 30.0–36.0)
MCV: 98.3 fL (ref 80.0–100.0)
Platelets: 431 10*3/uL — ABNORMAL HIGH (ref 150–400)
RBC: 2.92 MIL/uL — ABNORMAL LOW (ref 3.87–5.11)
RDW: 14.4 % (ref 11.5–15.5)
WBC: 9.4 10*3/uL (ref 4.0–10.5)
nRBC: 0 % (ref 0.0–0.2)

## 2023-01-27 MED ORDER — IOHEXOL 300 MG/ML  SOLN
150.0000 mL | Freq: Once | INTRAMUSCULAR | Status: AC | PRN
  Administered 2023-01-27: 150 mL via ORAL

## 2023-01-27 MED ORDER — TORSEMIDE 20 MG PO TABS
10.0000 mg | ORAL_TABLET | Freq: Every day | ORAL | Status: DC
  Administered 2023-01-27 – 2023-01-29 (×3): 10 mg via ORAL
  Filled 2023-01-27: qty 0.5
  Filled 2023-01-27 (×2): qty 1

## 2023-01-27 MED ORDER — DICYCLOMINE HCL 10 MG PO CAPS
10.0000 mg | ORAL_CAPSULE | Freq: Three times a day (TID) | ORAL | Status: DC | PRN
  Administered 2023-01-27 – 2023-01-29 (×5): 10 mg via ORAL
  Filled 2023-01-27 (×5): qty 1

## 2023-01-27 NOTE — Progress Notes (Signed)
[facility] Progress Note  SUBJECTIVE:   Interval history: [name] was seen and evaluated today at bedside.  Resting comfortably.  Denies shortness of breath.  Nausea is same.  Noted having some chest heaviness.  Had bowel movement, small, nonbloody.  Nausea improved with bowel movement.  Noted that she started Ozempic in June 2023, stopped 2 months ago.  Small bowel follow-through testing unremarkable.  Pending fecal calprotectin and GI pathogen panel.  Past Medical History:  Diagnosis Date   Anal fissure - posterior 10/16/2014    OCC  ISSUES   Anxiety    doesn't take anything   Asthma    has Albuterol inhaler as needed   Boil    on pubic area started septra 03-01-17 draining blood and pus   Bronchitis    Chronic headache disorder 07/22/2016   Clostridium difficile infection 04/20/2012   Colitis    Dehydration    Depression    Family history of adverse reaction to anesthesia    pt mom gets sick   GERD (gastroesophageal reflux disease)    takes Pantoprazole daily   Headache    Hiatal hernia    neuropathy - mild in arms and legs   History of blood transfusion    last transfusion was 04/04/2016=Benadryl was given d/t itching. States she always itches with transfusion.    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   Marginal ulcer 10/25/2017   Migraine    Motion sickness    Nausea    takes Zofran as needed   Nausea and vomiting    for 1 year   Obesity    Oligouria    Osteoarthritis    lower back, knees, wrists - no meds   Peripheral neuropathy 03/01/2019   Pneumonia 1997   Postoperative nausea and vomiting 01/21/2016   wants  scopolamine patch   Right carpal tunnel syndrome 03/01/2019   SVD (spontaneous vaginal delivery)    x 4   Tingling    BOTH LOWER EXTREMETIES ALL THE TIME DUE TO RAPID WEIGHT LOSS   TOBACCO USER 10/02/2009   Qualifier: Diagnosis of  By: Carolyne Fiscal MD, Clayburn Pert     Transfusion history    last transfusion 6'16    Tremor 08/31/2018   UC (ulcerative colitis) (HCC)    supposed to be taking Lialda and Bentyl but has been off since gastric sleeve   Vertigo    doesn't take any meds   Past Surgical History:  Procedure Laterality Date   ABDOMINAL HYSTERECTOMY     PARTIAL   BIOPSY  01/20/2023   Procedure: BIOPSY;  Surgeon: Kerin Salen, MD;  Location: [facility];  Service: Gastroenterology;;   COLONOSCOPY  2007   for rectal bleeding; [facility]   COLONOSCOPY N/A 06/11/2014   Procedure: COLONOSCOPY;  Surgeon: Iva Boop, MD;  Location: [facility];  Service: Endoscopy;  Laterality: N/A;   COLONOSCOPY WITH PROPOFOL N/A 03/02/2017   Procedure: COLONOSCOPY WITH PROPOFOL;  Surgeon: Ovidio Kin, MD;  Location: [facility];  Service: General;  Laterality: N/A;  DILATION AND CURETTAGE OF UTERUS     ESOPHAGOGASTRODUODENOSCOPY (EGD) WITH PROPOFOL N/A 04/06/2016   Procedure: ESOPHAGOGASTRODUODENOSCOPY (EGD) WITH PROPOFOL;  Surgeon: Ovidio Kin, MD;  Location: [facility];  Service: General;  Laterality: N/A;   ESOPHAGOGASTRODUODENOSCOPY (EGD) WITH PROPOFOL N/A 03/02/2017   Procedure: ESOPHAGOGASTRODUODENOSCOPY (EGD) WITH PROPOFOL;  Surgeon: Ovidio Kin, MD;  Location: [facility];  Service: General;  Laterality: N/A;   ESOPHAGOGASTRODUODENOSCOPY (EGD) WITH PROPOFOL N/A 05/20/2017   Procedure: ESOPHAGOGASTRODUODENOSCOPY (EGD) WITH PROPOFOL ERAS PATHWAY;  Surgeon: Ovidio Kin, MD;  Location: [facility];  Service: General;  Laterality: N/A;   ESOPHAGOGASTRODUODENOSCOPY (EGD) WITH PROPOFOL N/A 10/07/2017   Procedure: ESOPHAGOGASTRODUODENOSCOPY (EGD) WITH PROPOFOL;  Surgeon: Ovidio Kin, MD;  Location: [facility];  Service: General;  Laterality: N/A;   ESOPHAGOGASTRODUODENOSCOPY (EGD) WITH PROPOFOL N/A 01/20/2023   Procedure: ESOPHAGOGASTRODUODENOSCOPY (EGD) WITH PROPOFOL;  Surgeon: Kerin Salen, MD;  Location: [facility];  Service: Gastroenterology;  Laterality: N/A;   EXCISION OF SKIN TAG  06/08/2017   Procedure: EXCISION OF VULVAR SKIN TAGS X2;  Surgeon: Reva Bores, MD;  Location: [facility];  Service: Gynecology;;   FOOT SURGERY Bilateral    x 2   GASTRIC ROUX-EN-Y N/A 12/14/2016   Procedure: LAPAROSCOPIC REVISION SLEEVE GASTRECTOMY TO  ROUX-Y-GASTRIC BY-PASS, UPPER ENDO;  Surgeon: Glenna Fellows, MD;  Location: [facility];  Service: General;  Laterality: N/A;   GASTROJEJUNOSTOMY N/A 05/11/2016   Procedure: LAPAROSCOPIC PLACEMENT  OF FEEDING  JEJUNOSTOMY TUBE;  Surgeon: Glenna Fellows, MD;  Location: [facility];  Service: General;  Laterality: N/A;   HEMORRHOIDECTOMY WITH HEMORRHOID BANDING     IR FLUORO GUIDE CV LINE RIGHT  04/06/2017   IR GENERIC HISTORICAL  05/19/2016   IR CM INJ ANY COLONIC TUBE W/FLUORO 05/19/2016 Irish Lack, MD [facility]   IR PATIENT EVAL TECH 0-60 MINS  11/30/2017   IR REPLC DUODEN/JEJUNO TUBE PERCUT W/FLUORO  03/14/2018   IR REPLC DUODEN/JEJUNO TUBE PERCUT W/FLUORO  03/15/2018   IR US GUIDE VASC ACCESS RIGHT  04/06/2017   j tube removed march 2018     KNEE ARTHROSCOPY Left 09/06/2014   LAPAROSCOPIC GASTRIC SLEEVE RESECTION N/A 01/14/2016   Procedure: LAPAROSCOPIC GASTRIC SLEEVE RESECTION;  Surgeon: Glenna Fellows, MD;  Location: [facility];  Service: General;  Laterality: N/A;   LAPAROSCOPIC REVISION OF GASTROJEJUNOSTOMY N/A 10/25/2017   Procedure: LAPAROSCOPIC REVISION OF GASTROJEJUNOSTOMY AND PARTIAL GASTRECTOMY, WITH PLACEMENT OF FEEDING GASTROSTOMY TUBE;  Surgeon: Glenna Fellows, MD;  Location: [facility];  Service: General;  Laterality: N/A;   LAPAROSCOPIC TUBAL LIGATION  10/16/2011   Procedure: LAPAROSCOPIC TUBAL LIGATION;  Surgeon: Esmeralda Arthur, MD;  Location:  [facility];  Service: Gynecology;  Laterality: Bilateral;   NOVASURE ABLATION  09/28/2010   mild persistent vaginal bleeding   right knee arthroscopy     05/07/2016   TOTAL KNEE ARTHROPLASTY Left 03/10/2018   Procedure: LEFT TOTAL KNEE ARTHROPLASTY;  Surgeon: Durene Romans, MD;  Location: [facility];  Service: Orthopedics;  Laterality: Left;  70 mins   TOTAL KNEE ARTHROPLASTY Right 01/23/2020   Procedure: TOTAL KNEE ARTHROPLASTY, BILATERAL TROCHANTERIC INJECTION;  Surgeon: Durene Romans, MD;  Location: [facility];  Service: Orthopedics;  Laterality: Right;  70 mins for Bilateral Troch injection 1 cc Depo and 2 CC Lidocaine   TUBAL LIGATION     VAGINAL HYSTERECTOMY Bilateral 06/08/2017   Procedure: HYSTERECTOMY VAGINAL W/ BILATERAL SALPINGECTOMY;  Surgeon: Reva Bores, MD;  Location: [facility];  Service: Gynecology;  Laterality: Bilateral;   wisdom teeth extracted  Current Facility-Administered Medications  Medication Dose Route Frequency Provider Last Rate Last Admin   albuterol (PROVENTIL) (2.5 MG/3ML) 0.083% nebulizer solution 2.5 mg  2.5 mg Nebulization Q6H PRN Standley Brooking, MD       Atogepant TABS 30 mg  30 mg Oral Daily Darreld Mclean R, MD   30 mg at 01/27/23 0807   atorvastatin (LIPITOR) tablet 40 mg  40 mg Oral Daily Darreld Mclean R, MD   40 mg at 01/27/23 1005   baclofen (LIORESAL) tablet 10 mg  10 mg Oral BID WC Darreld Mclean R, MD   10 mg at 01/27/23 1144   baclofen (LIORESAL) tablet 20 mg  20 mg Oral QHS Darreld Mclean R, MD   20 mg at 01/26/23 2317   buPROPion (WELLBUTRIN XL) 24 hr tablet 150 mg  150 mg Oral q morning Darreld Mclean R, MD   150 mg at 01/27/23 1006   buPROPion (WELLBUTRIN XL) 24 hr tablet 300 mg  300 mg Oral q morning Darreld Mclean R, MD   300 mg at 01/27/23 1004   busPIRone (BUSPAR) tablet 15 mg  15 mg Oral TID Charlsie Quest, MD   15 mg at 01/27/23 1553   cefTRIAXone (ROCEPHIN) 2 g in sodium chloride 0.9 % 100 mL IVPB  2 g Intravenous Q24H Standley Brooking, MD 200 mL/hr  at 01/27/23 1553 2 g at 01/27/23 1553   dicyclomine (BENTYL) capsule 10 mg  10 mg Oral TID PRN Liliane Shi H, DO   10 mg at 01/27/23 1344   DULoxetine (CYMBALTA) DR capsule 60 mg  60 mg Oral BID Charlsie Quest, MD   60 mg at 01/27/23 1005   fluticasone (FLONASE) 50 MCG/ACT nasal spray 2 spray  2 spray Each Nare Daily PRN Standley Brooking, MD       gabapentin (NEURONTIN) capsule 400 mg  400 mg Oral TID Darreld Mclean R, MD   400 mg at 01/27/23 1553   HYDROmorphone (DILAUDID) injection 1 mg  1 mg Intravenous Q3H PRN Darreld Mclean R, MD   1 mg at 01/27/23 1144   linaclotide (LINZESS) capsule 290 mcg  290 mcg Oral QAC breakfast Darreld Mclean R, MD   290 mcg at 01/27/23 0741   LORazepam (ATIVAN) tablet 1 mg  1 mg Oral BID PRN Charlsie Quest, MD   1 mg at 01/27/23 1553   mesalamine (LIALDA) EC tablet 2.4 g  2.4 g Oral Q breakfast Darreld Mclean R, MD   2.4 g at 01/27/23 0740   metroNIDAZOLE (FLAGYL) IVPB 500 mg  500 mg Intravenous Q12H Standley Brooking, MD 100 mL/hr at 01/27/23 1434 500 mg at 01/27/23 1434   mirtazapine (REMERON) tablet 15 mg  15 mg Oral QHS PRN Charlsie Quest, MD       Naldemedine Tosylate TABS 1 tablet  1 tablet Oral Daily Charlsie Quest, MD   1 tablet at 01/27/23 0807   ondansetron (ZOFRAN) tablet 4 mg  4 mg Oral Q6H PRN Darreld Mclean R, MD   4 mg at 01/27/23 1343   Or   ondansetron (ZOFRAN) injection 4 mg  4 mg Intravenous Q6H PRN Charlsie Quest, MD   4 mg at 01/27/23 0805   oxybutynin (DITROPAN) tablet 5 mg  5 mg Oral Daily Standley Brooking, MD   5 mg at 01/26/23 2317   pantoprazole (PROTONIX) injection 40 mg  40 mg Intravenous Q12H Charlsie Quest, MD   40 mg at 01/27/23  1004   sucralfate (CARAFATE) tablet 1 g  1 g Oral Q6H Darreld Mclean R, MD   1 g at 01/27/23 1144   torsemide (DEMADEX) tablet 10 mg  10 mg Oral Daily Glade Lloyd, MD   10 mg at 01/27/23 1345   valACYclovir (VALTREX) tablet 1,000 mg  1,000 mg Oral BID Standley Brooking, MD   1,000 mg at 01/27/23  1005   Allergies as of 01/25/2023 - Review Complete 01/25/2023  Allergen Reaction Noted   Coconut (cocos nucifera) Anaphylaxis and Itching 06/14/2017   Coconut oil Anaphylaxis, Itching, and Other (See Comments) 06/14/2017   Morphine and related Anaphylaxis and Hives 01/14/2016   Oxycodone Anaphylaxis 09/02/2011   Oxycodone-acetaminophen Anaphylaxis 10/08/2021   Ciprofloxacin Itching and Rash 04/20/2012   Penicillamine Hives and Other (See Comments) 08/02/2018   Trazodone and nefazodone Rash 04/10/2019   Vistaril [hydroxyzine hcl] Hives 04/10/2019   Doxycycline Nausea And Vomiting 09/02/2011   Silicone  06/11/2022   Penicillins Rash 09/02/2011   Tape Rash 01/31/2017   Review of Systems:  Review of Systems  Respiratory:  Negative for shortness of breath.   Cardiovascular:  Negative for chest pain.       Chest pressure  Gastrointestinal:  Positive for nausea. Negative for blood in stool and constipation.    OBJECTIVE:   Temp:  [98.2 F (36.8 C)-98.8 F (37.1 C)] 98.6 F (37 C) (05/01 1357) Pulse Rate:  [78-87] 87 (05/01 1357) Resp:  [14-20] 20 (05/01 1357) BP: (119-132)/(78-84) 122/78 (05/01 1357) SpO2:  [98 %-100 %] 98 % (05/01 1357) Last BM Date : 01/25/23 Physical Exam Constitutional:      General: She is not in acute distress.    Appearance: She is not ill-appearing, toxic-appearing or diaphoretic.  Cardiovascular:     Rate and Rhythm: Normal rate and regular rhythm.  Pulmonary:     Effort: No respiratory distress.     Breath sounds: Normal breath sounds.  Abdominal:     General: Bowel sounds are normal. There is no distension.     Palpations: Abdomen is soft.     Tenderness: There is abdominal tenderness (Epigastric and suprapubic).  Skin:    General: Skin is warm and dry.  Neurological:     Mental Status: She is alert.     Labs: Recent Labs    01/25/23 2057 01/26/23 0112 01/27/23 0604  WBC 12.0* 12.0* 9.4  HGB 10.6* 10.6* 9.0*  HCT 33.2* 34.1* 28.7*   PLT 503* 443* 431*   BMET Recent Labs    01/25/23 2057 01/26/23 0112  NA 137 137  K 3.4* 3.6  CL 101 102  CO2 26 24  GLUCOSE 95 82  BUN 13 12  CREATININE 0.70 0.71  CALCIUM 8.9 8.6*   LFT Recent Labs    01/25/23 2057  PROT 6.4*  ALBUMIN 3.0*  AST 11*  ALT 8  ALKPHOS 56  BILITOT 0.5   PT/INR No results for input(s): "LABPROT", "INR" in the last 72 hours. Diagnostic imaging: DG SMALL BOWEL W SINGLE CM (SOL OR THIN BA)  Result Date: 01/27/2023 CLINICAL DATA:  [id number] Nausea AND vomiting [id number] EXAM: SMALL BOWEL SERIES COMPARISON:  CT 01/25/2023. TECHNIQUE: Following ingestion of water-soluble contrast, serial small bowel images were obtained including spot views of the terminal ileum. FLUOROSCOPY: Fluoroscopy Time:  18 Radiation Exposure Index (if provided by the fluoroscopic device): 2.9 mGy Number of Acquired Spot Images: 9 FINDINGS: Scout radiograph demonstrates no evidence of bowel obstruction. Postsurgical changes of gastric bypass.  Patent gastrojejunostomy. There was rapid transit of water soluble contrast through the small bowel into the colon within 20 minutes. Spot images with compression were then obtained, limited due to patient discomfort. Normal small-bowel mucosal fold pattern. Known jejunal ulcers are not well visualized fluoroscopically. There is no evidence of bowel obstruction. No small bowel diverticula identified. No evidence of stricture. No evidence of extraluminal contrast leak. IMPRESSION: Postsurgical changes of gastric bypass. Patent gastrojejunostomy. Rapid transit of contrast through the small bowel into the colon. No bowel obstruction. Known jejunal ulcers are not well visualized fluoroscopically. No other fluoroscopic abnormality identified involving the small bowel. Electronically Signed   By: Caprice Renshaw M.D.   On: 01/27/2023 11:56   CT ABDOMEN PELVIS W CONTRAST  Result Date: 01/25/2023 CLINICAL DATA:  Acute abdominal pain EXAM: CT ABDOMEN AND PELVIS  WITH CONTRAST TECHNIQUE: Multidetector CT imaging of the abdomen and pelvis was performed using the standard protocol following bolus administration of intravenous contrast. RADIATION DOSE REDUCTION: This exam was performed according to the departmental dose-optimization program which includes automated exposure control, adjustment of the mA and/or kV according to patient size and/or use of iterative reconstruction technique. CONTRAST:  OMNIPAQUE IOHEXOL 300 MG/ML  SOLN COMPARISON:  CT abdomen and pelvis 01/17/2023 FINDINGS: Lower chest: There is a new trace left pleural effusion. Hepatobiliary: No focal liver abnormality is seen. No gallstones, gallbladder wall thickening, or biliary dilatation. Pancreas: Unremarkable. No pancreatic ductal dilatation or surrounding inflammatory changes. 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 Spleen is normal in size. Adrenals/Urinary Tract: Adrenal glands are unremarkable. Kidneys are normal, without renal calculi, focal lesion, or hydronephrosis. Bladder is unremarkable. Stomach/Bowel: Patient is status post gastric bypass. Again seen is wall thickening and edema of the small bowel loop extending from the gastrojejunal anastomosis. There is increased inflammatory stranding at the level of the anastomosis. There is no evidence for bowel obstruction, pneumatosis or free air. The appendix is not visualized. Vascular/Lymphatic: No significant vascular findings are present. No enlarged abdominal or pelvic lymph nodes. Reproductive: Status post hysterectomy. No adnexal masses. Other: There is new trace free fluid in the left upper quadrant and pelvis. No focal abdominal wall hernia. Musculoskeletal: Left-sided sacral stimulator device again seen. No acute osseous abnormality. IMPRESSION: 1. Status post gastric bypass. Again seen is wall thickening and edema of the small bowel loop extending from the gastrojejunal anastomosis. There is increased inflammatory stranding  at the level of the anastomosis. Findings are concerning for enteritis. No bowel obstruction or free air. 2. New trace free fluid in the left upper quadrant and pelvis. 3. New trace left pleural effusion. Electronically Signed   By: Darliss Cheney M.D.   On: 01/25/2023 23:12    IMPRESSION: Intractable nausea and nonbloody vomiting Status post bariatric surgery with revision and gastric bypass in 2021  -Small bowel follow-through unremarkable Clean-based gastric anastomotic and jejunal ulcerations on EGD 01/20/2023 Ulcerative colitis diagnosed 2015, active disease at appendiceal orifice on colonoscopy 2019  -Fecal calprotectin pending, GI pathogen panel pending Chronic constipation, suspect from opiate use  -On prior to admission Linzess  PLAN: -Presume intractable nausea and vomiting from ulcerations noted on EGD 01/20/2023 -Continue supportive therapy with PPI and Carafate -Follow-up GI pathogen panel and fecal calprotectin for completeness -Continue home bowel regimen -Continue dicyclomine -[facility] team will respectfully sign off and be available for any questions that arise   LOS: 1 day   Liliane Shi, DO [facility]

## 2023-01-27 NOTE — Progress Notes (Signed)
PROGRESS NOTE    [name]  ZOX:[id number] DOB: 12-09-1975 DOA: 01/25/2023 PCP: Felix Pacini, FNP   Brief Narrative:  47 year old woman with past medical history of ulcerative colitis, gastric bypass, iron deficiency, B12 deficiency, chronic migraine, depression/anxiety recent admission for GI bleed secondary to gastrojejunal anastomotic ulcers from 01/17/2023-01/21/2023 requiring EGD on 01/20/2023 presented with worsening abdominal pain, nausea and vomiting.  On presentation, hemoglobin was 10.6, WBC of 12, lipase of 21.  CT of abdomen/pelvis showed wall thickening from gastrojejunal anastomosis with inflammatory stranding increased, concern for enteritis.  Assessment & Plan:   Abdominal pain with nausea and vomiting Possible enteritis Recent UGI bleed due to gastrojejunal/anastomotic ulcers PMH gastric bypass: CT showed wall thickening from gastrojejunal anastomosis with inflammatory stranding increased.  Concern for enteritis.  Currently on empiric antibiotics GI following and has ordered small bowel follow-through which is pending.  Patient has not tried solid food yet.  Still complains of intermittent severe abdominal pain with nausea. Once able to advance diet, use  small frequent meals Continue PPI twice daily, Carafate 4 times daily   Ulcerative colitis: No report of colonic involvement on CT.  Continue mesalamine    Normocytic anemia History of iron and B12 deficiency: Hemoglobin stable at 9.0 today.   Hypokalemia: Resolved.  No labs today.   Chronic migraine: Continue Qulipta.   Depression/anxiety: Continue Wellbutrin, BuSpar, Cymbalta, Remeron.  Continue home Ativan as needed.   DVT prophylaxis: SCDs  Code Status: Full code Family Communication: None at bedside Disposition Plan: Status is: Inpatient Remains inpatient appropriate because: Of severity of illness.  Has not tolerated diet well.  Consultants: GI  Procedures: None  Antimicrobials:  None   Subjective: Patient seen and examined at bedside.  Feels slightly better but still complains of intermittent severe abdominal pain.  Has not tried soft food yet.  No fever, shortness of breath reported.  Objective: Vitals:   01/26/23 0422 01/26/23 1319 01/26/23 2131 01/27/23 0446  BP: 117/67 135/84 132/84 119/80  Pulse: 89 81 84 78  Resp: 16  18 14   Temp: 98.7 F (37.1 C) 98.4 F (36.9 C) 98.8 F (37.1 C) 98.2 F (36.8 C)  TempSrc: Oral Oral Oral Oral  SpO2: 97% 100% 100% 98%  Weight:      Height:        Intake/Output Summary (Last 24 hours) at 01/27/2023 1347 Last data filed at 01/27/2023 0345 Gross per 24 hour  Intake 200.74 ml  Output --  Net 200.74 ml   Filed Weights   01/26/23 0132  Weight: 75.6 kg    Examination:  General exam: Appears calm and comfortable.  On room air. Respiratory system: Bilateral decreased breath sounds at bases Cardiovascular system: S1 & S2 heard, Rate controlled Gastrointestinal system: Abdomen is nondistended, soft and mildly tender.  Normal bowel sounds heard. Extremities: No cyanosis, clubbing, edema  Central nervous system: Alert and oriented. No focal neurological deficits. Moving extremities Skin: No rashes, lesions or ulcers Psychiatry: Flat affect.  Not agitated.  Data Reviewed: I have personally reviewed following labs and imaging studies  CBC: Recent Labs  Lab 01/20/23 1626 01/21/23 0405 01/25/23 2057 01/26/23 0112 01/27/23 0604  WBC  --  5.3 12.0* 12.0* 9.4  NEUTROABS  --   --  9.6*  --   --   HGB 8.9* 8.3* 10.6* 10.6* 9.0*  HCT 27.5* 25.7* 33.2* 34.1* 28.7*  MCV  --  98.1 96.0 98.6 98.3  PLT  --  248 503* 443* 431*  Basic Metabolic Panel: Recent Labs  Lab 01/21/23 0405 01/25/23 2057 01/26/23 0112  NA 137 137 137  K 4.1 3.4* 3.6  CL 105 101 102  CO2 24 26 24   GLUCOSE 79 95 82  BUN 8 13 12   CREATININE 0.77 0.70 0.71  CALCIUM 8.2* 8.9 8.6*   GFR: Estimated Creatinine Clearance: 95.2 mL/min (by  C-G formula based on SCr of 0.71 mg/dL). Liver Function Tests: Recent Labs  Lab 01/25/23 2057  AST 11*  ALT 8  ALKPHOS 56  BILITOT 0.5  PROT 6.4*  ALBUMIN 3.0*   Recent Labs  Lab 01/25/23 2057  LIPASE 21   No results for input(s): "AMMONIA" in the last 168 hours. Coagulation Profile: No results for input(s): "INR", "PROTIME" in the last 168 hours. Cardiac Enzymes: No results for input(s): "CKTOTAL", "CKMB", "CKMBINDEX", "TROPONINI" in the last 168 hours. BNP (last 3 results) No results for input(s): "PROBNP" in the last 8760 hours. HbA1C: No results for input(s): "HGBA1C" in the last 72 hours. CBG: Recent Labs  Lab 01/20/23 2001  GLUCAP 100*   Lipid Profile: No results for input(s): "CHOL", "HDL", "LDLCALC", "TRIG", "CHOLHDL", "LDLDIRECT" in the last 72 hours. Thyroid Function Tests: No results for input(s): "TSH", "T4TOTAL", "FREET4", "T3FREE", "THYROIDAB" in the last 72 hours. Anemia Panel: No results for input(s): "VITAMINB12", "FOLATE", "FERRITIN", "TIBC", "IRON", "RETICCTPCT" in the last 72 hours. Sepsis Labs: No results for input(s): "PROCALCITON", "LATICACIDVEN" in the last 168 hours.  No results found for this or any previous visit (from the past 240 hour(s)).     Scheduled Meds:  Atogepant  30 mg Oral Daily   atorvastatin  40 mg Oral Daily   baclofen  10 mg Oral BID WC   baclofen  20 mg Oral QHS   buPROPion  150 mg Oral q morning   buPROPion  300 mg Oral q morning   busPIRone  15 mg Oral TID   DULoxetine  60 mg Oral BID   gabapentin  400 mg Oral TID   linaclotide  290 mcg Oral QAC breakfast   mesalamine  2.4 g Oral Q breakfast   Naldemedine Tosylate  1 tablet Oral Daily   oxybutynin  5 mg Oral Daily   pantoprazole (PROTONIX) IV  40 mg Intravenous Q12H   sucralfate  1 g Oral Q6H   torsemide  10 mg Oral Daily   valACYclovir  1,000 mg Oral BID   Continuous Infusions:  cefTRIAXone (ROCEPHIN)  IV Stopped (01/26/23 1559)   metronidazole 100 mL/hr  at 01/27/23 0345          Glade Lloyd, MD [facility] 01/27/2023, 1:47 PM

## 2023-01-27 NOTE — Progress Notes (Signed)
Mobility Specialist - Progress Note   01/27/23 0858  Mobility  Activity Ambulated with assistance in hallway  Level of Assistance Standby assist, set-up cues, supervision of patient - no hands on  Assistive Device None  Distance Ambulated (ft) 600 ft  Activity Response Tolerated well  Mobility Referral Yes  $Mobility charge 1 Mobility   Pt received in bed and agreed to mobility, had no issues throughout session, pt returned to bed with all needs met.  Marilynne Halsted Mobility Specialist

## 2023-01-28 DIAGNOSIS — R1110 Vomiting, unspecified: Secondary | ICD-10-CM | POA: Diagnosis not present

## 2023-01-28 DIAGNOSIS — R109 Unspecified abdominal pain: Secondary | ICD-10-CM | POA: Diagnosis not present

## 2023-01-28 DIAGNOSIS — F418 Other specified anxiety disorders: Secondary | ICD-10-CM

## 2023-01-28 DIAGNOSIS — K529 Noninfective gastroenteritis and colitis, unspecified: Secondary | ICD-10-CM | POA: Diagnosis not present

## 2023-01-28 LAB — GASTROINTESTINAL PANEL BY PCR, STOOL (REPLACES STOOL CULTURE)

## 2023-01-28 LAB — CBC WITH DIFFERENTIAL/PLATELET
Abs Immature Granulocytes: 0.01 10*3/uL (ref 0.00–0.07)
Basophils Absolute: 0 10*3/uL (ref 0.0–0.1)
Basophils Relative: 0 %
Eosinophils Absolute: 0.2 10*3/uL (ref 0.0–0.5)
Eosinophils Relative: 3 %
HCT: 30 % — ABNORMAL LOW (ref 36.0–46.0)
Hemoglobin: 9.4 g/dL — ABNORMAL LOW (ref 12.0–15.0)
Immature Granulocytes: 0 %
Lymphocytes Relative: 36 %
Lymphs Abs: 2.4 10*3/uL (ref 0.7–4.0)
MCH: 30 pg (ref 26.0–34.0)
MCHC: 31.3 g/dL (ref 30.0–36.0)
MCV: 95.8 fL (ref 80.0–100.0)
Monocytes Absolute: 0.5 10*3/uL (ref 0.1–1.0)
Monocytes Relative: 7 %
Neutro Abs: 3.6 10*3/uL (ref 1.7–7.7)
Neutrophils Relative %: 54 %
Platelets: 446 10*3/uL — ABNORMAL HIGH (ref 150–400)
RBC: 3.13 MIL/uL — ABNORMAL LOW (ref 3.87–5.11)
RDW: 14.2 % (ref 11.5–15.5)
WBC: 6.7 10*3/uL (ref 4.0–10.5)
nRBC: 0 % (ref 0.0–0.2)

## 2023-01-28 LAB — MAGNESIUM: Magnesium: 2.4 mg/dL (ref 1.7–2.4)

## 2023-01-28 LAB — COMPREHENSIVE METABOLIC PANEL
ALT: 7 U/L (ref 0–44)
AST: 10 U/L — ABNORMAL LOW (ref 15–41)
Albumin: 2.6 g/dL — ABNORMAL LOW (ref 3.5–5.0)
Alkaline Phosphatase: 48 U/L (ref 38–126)
Anion gap: 7 (ref 5–15)
BUN: 5 mg/dL — ABNORMAL LOW (ref 6–20)
CO2: 27 mmol/L (ref 22–32)
Calcium: 8.4 mg/dL — ABNORMAL LOW (ref 8.9–10.3)
Chloride: 105 mmol/L (ref 98–111)
Creatinine, Ser: 0.81 mg/dL (ref 0.44–1.00)
GFR, Estimated: >60 mL/min (ref >60)
Glucose, Bld: 77 mg/dL (ref 70–99)
Potassium: 3.7 mmol/L (ref 3.5–5.1)
Sodium: 139 mmol/L (ref 135–145)
Total Bilirubin: 0.3 mg/dL (ref 0.3–1.2)
Total Protein: 5.4 g/dL — ABNORMAL LOW (ref 6.5–8.1)

## 2023-01-28 MED ORDER — HYDROMORPHONE HCL 4 MG PO TABS
4.0000 mg | ORAL_TABLET | ORAL | Status: DC | PRN
  Administered 2023-01-28 – 2023-01-29 (×7): 4 mg via ORAL
  Filled 2023-01-28 (×7): qty 1

## 2023-01-28 MED ORDER — HYDROMORPHONE HCL 4 MG PO TABS: 4.0000 mg | ORAL_TABLET | Freq: Three times a day (TID) | ORAL | Status: DC | PRN

## 2023-01-28 NOTE — Telephone Encounter (Signed)
Pt called in stating that she spoke with [employer] and resolved issue. I will call [employer] to verify and then reach out to pt to set Botox appt back up.

## 2023-01-28 NOTE — Progress Notes (Addendum)
PROGRESS NOTE    [name]  ZOX:[id number] DOB: [birth year] DOA: 01/25/2023 PCP: Felix Pacini, FNP   Brief Narrative:  47 year old woman with past medical history of ulcerative colitis, gastric bypass, iron deficiency, B12 deficiency, chronic migraine, depression/anxiety recent admission for GI bleed secondary to gastrojejunal anastomotic ulcers from 01/17/2023-01/21/2023 requiring EGD on 01/20/2023 presented with worsening abdominal pain, nausea and vomiting.  On presentation, hemoglobin was 10.6, WBC of 12, lipase of 21.  CT of abdomen/pelvis showed wall thickening from gastrojejunal anastomosis with inflammatory stranding increased, concern for enteritis.  Assessment & Plan:   Abdominal pain with nausea and vomiting Possible enteritis Recent UGI bleed due to gastrojejunal/anastomotic ulcers PMH gastric bypass: CT showed wall thickening from gastrojejunal anastomosis with inflammatory stranding increased.  Concern for enteritis.  Currently on empiric antibiotics GI following and has ordered small bowel follow-through which was unremarkable.  GI signed off on 01/27/2023.  Still complains of intermittent severe abdominal pain with nausea with solid food.  Not ready for discharge today. Continue PPI twice daily, Carafate 4 times daily   Ulcerative colitis: No report of colonic involvement on CT.  Continue mesalamine    Normocytic anemia History of iron and B12 deficiency: Hemoglobin stable at 9.4 today.   Hypokalemia: Resolved.     Chronic migraine: Continue Qulipta.   Depression/anxiety: Continue Wellbutrin, BuSpar, Cymbalta, Remeron.  Continue home Ativan as needed.  Chronic pain -Patient apparently takes oral Dilaudid 4 mg few times a day as needed as per her pain management provider.  She is requesting that this be resumed.    Leukocytosis -Resolved  Thrombocytosis -Probably reactive.  Improving.  Monitor intermittently.  DVT prophylaxis: SCDs  Code Status:  Full code Family Communication: None at bedside Disposition Plan: Status is: Inpatient Remains inpatient appropriate because: Of severity of illness.  Has not tolerated diet well.  Consultants: GI  Procedures: None  Antimicrobials: None   Subjective: Patient seen and examined at bedside.  Still complains of severe intermittent abdominal pain with solid food.  Does not feel ready to go home today.  No fever, shortness of breath reported. Objective: Vitals:   01/27/23 0446 01/27/23 1357 01/27/23 2001 01/28/23 0502  BP: 119/80 122/78 112/70 119/71  Pulse: 78 87 84 74  Resp: 14 20 16 16   Temp: 98.2 F (36.8 C) 98.6 F (37 C) 98.5 F (36.9 C) 98.3 F (36.8 C)  TempSrc: Oral Oral Oral Oral  SpO2: 98% 98% 98% 100%  Weight:      Height:        Intake/Output Summary (Last 24 hours) at 01/28/2023 1230 Last data filed at 01/28/2023 1100 Gross per 24 hour  Intake 623.06 ml  Output 1 ml  Net 622.06 ml    Filed Weights   01/26/23 0132  Weight: 75.6 kg    Examination:  General: Currently on room air.  No distress.  respiratory: Decreased breath sounds at bases bilaterally with some crackles CVS: Currently rate controlled; S1-S2 heard  abdominal: Soft, tender mildly, slightly distended, no organomegaly;  bowel sounds are heard  extremities: No edema or clubbing   Data Reviewed: I have personally reviewed following labs and imaging studies  CBC: Recent Labs  Lab 01/25/23 2057 01/26/23 0112 01/27/23 0604 01/28/23 0716  WBC 12.0* 12.0* 9.4 6.7  NEUTROABS 9.6*  --   --  3.6  HGB 10.6* 10.6* 9.0* 9.4*  HCT 33.2* 34.1* 28.7* 30.0*  MCV 96.0 98.6 98.3 95.8  PLT 503* 443* 431* 446*  Basic Metabolic Panel: Recent Labs  Lab 01/25/23 2057 01/26/23 0112 01/28/23 0716  NA 137 137 139  K 3.4* 3.6 3.7  CL 101 102 105  CO2 26 24 27   GLUCOSE 95 82 77  BUN 13 12 5*  CREATININE 0.70 0.71 0.81  CALCIUM 8.9 8.6* 8.4*  MG  --   --  2.4    GFR: Estimated Creatinine  Clearance: 94 mL/min (by C-G formula based on SCr of 0.81 mg/dL). Liver Function Tests: Recent Labs  Lab 01/25/23 2057 01/28/23 0716  AST 11* 10*  ALT 8 7  ALKPHOS 56 48  BILITOT 0.5 0.3  PROT 6.4* 5.4*  ALBUMIN 3.0* 2.6*    Recent Labs  Lab 01/25/23 2057  LIPASE 21    No results for input(s): "AMMONIA" in the last 168 hours. Coagulation Profile: No results for input(s): "INR", "PROTIME" in the last 168 hours. Cardiac Enzymes: No results for input(s): "CKTOTAL", "CKMB", "CKMBINDEX", "TROPONINI" in the last 168 hours. BNP (last 3 results) No results for input(s): "PROBNP" in the last 8760 hours. HbA1C: No results for input(s): "HGBA1C" in the last 72 hours. CBG: No results for input(s): "GLUCAP" in the last 168 hours.  Lipid Profile: No results for input(s): "CHOL", "HDL", "LDLCALC", "TRIG", "CHOLHDL", "LDLDIRECT" in the last 72 hours. Thyroid Function Tests: No results for input(s): "TSH", "T4TOTAL", "FREET4", "T3FREE", "THYROIDAB" in the last 72 hours. Anemia Panel: No results for input(s): "VITAMINB12", "FOLATE", "FERRITIN", "TIBC", "IRON", "RETICCTPCT" in the last 72 hours. Sepsis Labs: No results for input(s): "PROCALCITON", "LATICACIDVEN" in the last 168 hours.  No results found for this or any previous visit (from the past 240 hour(s)).     Scheduled Meds:  Atogepant  30 mg Oral Daily   atorvastatin  40 mg Oral Daily   baclofen  10 mg Oral BID WC   baclofen  20 mg Oral QHS   buPROPion  150 mg Oral q morning   buPROPion  300 mg Oral q morning   busPIRone  15 mg Oral TID   DULoxetine  60 mg Oral BID   gabapentin  400 mg Oral TID   linaclotide  290 mcg Oral QAC breakfast   mesalamine  2.4 g Oral Q breakfast   Naldemedine Tosylate  1 tablet Oral Daily   oxybutynin  5 mg Oral Daily   pantoprazole (PROTONIX) IV  40 mg Intravenous Q12H   sucralfate  1 g Oral Q6H   torsemide  10 mg Oral Daily   valACYclovir  1,000 mg Oral BID   Continuous Infusions:   cefTRIAXone (ROCEPHIN)  IV Stopped (01/27/23 1624)   metronidazole 500 mg (01/28/23 0402)          Glade Lloyd, MD [facility] 01/28/2023, 12:30 PM

## 2023-01-29 DIAGNOSIS — R109 Unspecified abdominal pain: Secondary | ICD-10-CM | POA: Diagnosis not present

## 2023-01-29 DIAGNOSIS — R1110 Vomiting, unspecified: Secondary | ICD-10-CM | POA: Diagnosis not present

## 2023-01-29 LAB — CALPROTECTIN, FECAL: Calprotectin, Fecal: 1300 ug/g — ABNORMAL HIGH (ref 0–120)

## 2023-01-29 MED ORDER — FERROUS SULFATE 325 (65 FE) MG PO TABS: 325.0000 mg | ORAL_TABLET | Freq: Every day | ORAL | Status: DC

## 2023-01-29 MED ORDER — BACLOFEN 10 MG PO TABS: 10.0000 mg | ORAL_TABLET | Freq: Three times a day (TID) | ORAL | Status: DC

## 2023-01-29 NOTE — Discharge Summary (Signed)
Physician Discharge Summary  [name] VHQ:[id number] DOB: 06-19-1976 DOA: 01/25/2023  PCP: Felix Pacini, FNP  Admit date: 01/25/2023 Discharge date: 01/29/2023  Admitted From: Home Disposition: Home  Recommendations for Outpatient Follow-up:  Follow up with PCP in 1 week with repeat CBC/BMP Outpatient follow-up with GI Follow up in ED if symptoms worsen or new appear   Home Health: No Equipment/Devices: None  Discharge Condition: Stable CODE STATUS: Full Diet recommendation: Soft diet  Brief/Interim Summary: 47 year old woman with past medical history of ulcerative colitis, gastric bypass, iron deficiency, B12 deficiency, chronic migraine, depression/anxiety recent admission for GI bleed secondary to gastrojejunal anastomotic ulcers from 01/17/2023-01/21/2023 requiring EGD on 01/20/2023 presented with worsening abdominal pain, nausea and vomiting. On presentation, hemoglobin was 10.6, WBC of 12, lipase of 21. CT of abdomen/pelvis showed wall thickening from gastrojejunal anastomosis with inflammatory stranding increased, concern for enteritis.  During the hospitalization, GI was consulted.  She was managed conservatively.  Diet was gradually advanced.  She is currently tolerating diet with intermittent abdominal pain and nausea but feels okay to go home today.  GI has signed off and recommended outpatient follow-up with her regular gastroenterologist.  She will be discharged home today.  Discharge Diagnoses:   Abdominal pain with nausea and vomiting Possible enteritis Recent UGI bleed due to gastrojejunal/anastomotic ulcers PMH gastric bypass: CT showed wall thickening from gastrojejunal anastomosis with inflammatory stranding increased.  Concern for enteritis.  Currently on empiric antibiotics GI ordered small bowel follow-through which was unremarkable.  GI signed off on 01/27/2023.  Diet was gradually advanced.  She is currently tolerating diet with intermittent abdominal  pain and nausea but feels okay to go home today. . Continue PPI twice daily, Carafate 4 times daily She will be discharged home today. Outpatient follow-up with PCP and her regular GI   Ulcerative colitis: No report of colonic involvement on CT.  Continue mesalamine    Normocytic anemia History of iron and B12 deficiency: Hemoglobin stable at 9.4 on 01/28/2023.  Outpatient follow-up.  Hypokalemia: Resolved.     Chronic migraine: Continue Qulipta.   Depression/anxiety: Continue Wellbutrin, BuSpar, Cymbalta, Remeron.  Continue home Ativan as needed.  Outpatient follow-up with PCP/psychiatry   Chronic pain -Patient apparently takes oral Dilaudid every 3 hours as needed needed as per her pain management provider.  This has been resumed during the hospitalization.  Patient will need to follow-up with her pain management provider for refills of the same.   Leukocytosis -Resolved   Thrombocytosis -Probably reactive.  Improving.  Monitor intermittently as an outpatient.  Discharge Instructions  Discharge Instructions     Diet general   Complete by: As directed    Soft diet   Increase activity slowly   Complete by: As directed       Allergies as of 01/29/2023       Reactions   Coconut (cocos Nucifera) Anaphylaxis, Itching   Coconut Oil Anaphylaxis, Itching, Other (See Comments)   Morphine And Related Anaphylaxis, Hives   Pt states that she has tolerated Norco and Dilaudid   Oxycodone Anaphylaxis   Oxycodone-acetaminophen Anaphylaxis   Ciprofloxacin Itching, Rash   Penicillamine Hives, Other (See Comments)   Trazodone And Nefazodone Rash   Vistaril [hydroxyzine Hcl] Hives   Doxycycline Nausea And Vomiting   Silicone    Penicillins Rash   Has patient had a PCN reaction causing immediate rash, facial/tongue/throat swelling, SOB or lightheadedness with hypotension: Yes Has patient had a PCN reaction causing severe rash involving mucus membranes  or skin necrosis: No Has  patient had a PCN reaction that required hospitalization No Has patient had a PCN reaction occurring within the last 10 years: No If all of the above answers are "NO", then may proceed with Cephalosporin use.   Tape Rash   And paper tape causes a rash if wearing for a prolong period of time        Medication List     TAKE these medications    albuterol 108 (90 Base) MCG/ACT inhaler Commonly known as: VENTOLIN HFA Inhale 2 puffs into the lungs every 6 (six) hours as needed for wheezing or shortness of breath.   atorvastatin 40 MG tablet Commonly known as: LIPITOR Take 40 mg by mouth daily.   baclofen 10 MG tablet Commonly known as: LIORESAL Take 1 tablet (10 mg total) by mouth in the morning, at noon, and at bedtime. Take 1 tablet (10 mg) in morning, noon and Take 2 tablets (20 mg) at bedtime   Botox 200 units injection Generic drug: botulinum toxin Type A INJECT 155 UNITS INTRAMUSCULARLY INTO HEAD AND NECK MUSCLES EVERY 3 MONTHS   buPROPion 150 MG 24 hr tablet Commonly known as: WELLBUTRIN XL Take 150 mg by mouth every morning. Take along with 300 mg=450 mg   buPROPion 300 MG 24 hr tablet Commonly known as: WELLBUTRIN XL Take 300 mg by mouth every morning. Take along with 150 mg=450 mg   busPIRone 15 MG tablet Commonly known as: BUSPAR Take 15 mg by mouth 3 (three) times daily.   cyanocobalamin 1000 MCG/ML injection Commonly known as: VITAMIN B12 Inject 1,000 mcg into the muscle 2 (two) times a week.   diclofenac Sodium 1 % Gel Commonly known as: VOLTAREN Apply topically 4 (four) times daily.   dicyclomine 10 MG capsule Commonly known as: BENTYL Take 10 mg by mouth 2 (two) times daily as needed for spasms.   DULoxetine 60 MG capsule Commonly known as: CYMBALTA Take 1 capsule (60 mg total) by mouth 2 (two) times daily.   EPINEPHrine 0.3 mg/0.3 mL Soaj injection Commonly known as: EPI-PEN Inject 0.3 mg into the muscle as needed for anaphylaxis.   ferrous  sulfate 325 (65 FE) MG tablet Take 1 tablet (325 mg total) by mouth daily with breakfast.   fluticasone 50 MCG/ACT nasal spray Commonly known as: FLONASE Place 2 sprays into both nostrils daily as needed for allergies or rhinitis.   gabapentin 400 MG capsule Commonly known as: NEURONTIN Take 400 mg by mouth 3 (three) times daily.   hydrocortisone 2.5 % rectal cream Commonly known as: ANUSOL-HC Place 1 Application rectally daily as needed for hemorrhoids.   HYDROmorphone 4 MG tablet Commonly known as: DILAUDID Take 1 tablet (4 mg total) by mouth every 3 (three) hours.   linaclotide 290 MCG Caps capsule Commonly known as: LINZESS Take 290 mcg by mouth daily before breakfast.   LORazepam 1 MG tablet Commonly known as: ATIVAN Take 1 mg by mouth 2 (two) times daily as needed for anxiety.   magnesium oxide 400 (240 Mg) MG tablet Commonly known as: MAG-OX Take 1 tablet by mouth daily.   meclizine 25 MG tablet Commonly known as: ANTIVERT Take 25 mg by mouth 3 (three) times daily.   mesalamine 1.2 g EC tablet Commonly known as: LIALDA Take 2.4 g by mouth daily with breakfast.   mirtazapine 15 MG tablet Commonly known as: REMERON Take 15 mg by mouth at bedtime as needed (sleep).   multivitamin with minerals Tabs  tablet Take 1 tablet by mouth daily.   Narcan 4 MG/0.1ML Liqd nasal spray kit Generic drug: naloxone Place 1 spray into the nose daily as needed (overdose).   ondansetron 8 MG tablet Commonly known as: ZOFRAN Take 8 mg by mouth every 8 (eight) hours as needed for nausea or vomiting.   oxybutynin 5 MG tablet Commonly known as: DITROPAN Take 5 mg by mouth daily.   Ozempic (2 MG/DOSE) 8 MG/3ML Sopn Generic drug: Semaglutide (2 MG/DOSE) Inject 2 mg into the skin once a week.   pantoprazole 40 MG tablet Commonly known as: PROTONIX TAKE 1 TABLET BY MOUTH TWICE A DAY BEFORE A MEAL   phentermine 37.5 MG tablet Commonly known as: ADIPEX-P Take 37.5 mg by mouth  daily before breakfast.   Qulipta 30 MG Tabs Generic drug: Atogepant Take 1 tablet (30 mg total) by mouth daily.   sucralfate 1 g tablet Commonly known as: CARAFATE Take 1 tablet (1 g total) by mouth every 6 (six) hours.   SUMAtriptan 50 MG tablet Commonly known as: Imitrex Take 1 tablet (50 mg total) by mouth every 2 (two) hours as needed for migraine. May repeat in 2 hours if headache persists or recurs.   Symproic 0.2 MG Tabs Generic drug: Naldemedine Tosylate Take 1 tablet by mouth daily.   thiamine 100 MG tablet Commonly known as: Vitamin B-1 Take 100 mg by mouth daily at 12 noon.   torsemide 10 MG tablet Commonly known as: DEMADEX Take 10 mg by mouth daily.   valACYclovir 1000 MG tablet Commonly known as: VALTREX Take 1,000 mg by mouth 2 (two) times daily.   Vitamin D3 1.25 MG (50000 UT) Caps Take 50,000 Units by mouth every 7 (seven) days.        Follow-up Information     Felix Pacini, FNP. Schedule an appointment as soon as possible for a visit in 1 week(s).   Specialty: Endocrinology Contact information: [address] [place] [address] [phone]         Gastroenterologist. Schedule an appointment as soon as possible for a visit in 1 week(s).                 Allergies  Allergen Reactions   Coconut (Cocos Nucifera) Anaphylaxis and Itching   Coconut Oil Anaphylaxis, Itching and Other (See Comments)   Morphine And Related Anaphylaxis and Hives    Pt states that she has tolerated Norco and Dilaudid   Oxycodone Anaphylaxis   Oxycodone-Acetaminophen Anaphylaxis   Ciprofloxacin Itching and Rash   Penicillamine Hives and Other (See Comments)   Trazodone And Nefazodone Rash   Vistaril [Hydroxyzine Hcl] Hives   Doxycycline Nausea And Vomiting   Silicone    Penicillins Rash    Has patient had a PCN reaction causing immediate rash, facial/tongue/throat swelling, SOB or lightheadedness with hypotension: Yes Has patient had a PCN  reaction causing severe rash involving mucus membranes or skin necrosis: No Has patient had a PCN reaction that required hospitalization No Has patient had a PCN reaction occurring within the last 10 years: No If all of the above answers are "NO", then may proceed with Cephalosporin use.   Tape Rash    And paper tape causes a rash if wearing for a prolong period of time    Consultations: GI   Procedures/Studies: DG SMALL BOWEL W SINGLE CM (SOL OR THIN BA)  Result Date: 01/27/2023 CLINICAL DATA:  [id number] Nausea AND vomiting [id number] EXAM: SMALL BOWEL SERIES COMPARISON:  CT  01/25/2023. TECHNIQUE: Following ingestion of water-soluble contrast, serial small bowel images were obtained including spot views of the terminal ileum. FLUOROSCOPY: Fluoroscopy Time:  18 Radiation Exposure Index (if provided by the fluoroscopic device): 2.9 mGy Number of Acquired Spot Images: 9 FINDINGS: Scout radiograph demonstrates no evidence of bowel obstruction. Postsurgical changes of gastric bypass. Patent gastrojejunostomy. There was rapid transit of water soluble contrast through the small bowel into the colon within 20 minutes. Spot images with compression were then obtained, limited due to patient discomfort. Normal small-bowel mucosal fold pattern. Known jejunal ulcers are not well visualized fluoroscopically. There is no evidence of bowel obstruction. No small bowel diverticula identified. No evidence of stricture. No evidence of extraluminal contrast leak. IMPRESSION: Postsurgical changes of gastric bypass. Patent gastrojejunostomy. Rapid transit of contrast through the small bowel into the colon. No bowel obstruction. Known jejunal ulcers are not well visualized fluoroscopically. No other fluoroscopic abnormality identified involving the small bowel. Electronically Signed   By: Caprice Renshaw M.D.   On: 01/27/2023 11:56   CT ABDOMEN PELVIS W CONTRAST  Result Date: 01/25/2023 CLINICAL DATA:  Acute abdominal pain EXAM: CT  ABDOMEN AND PELVIS WITH CONTRAST TECHNIQUE: Multidetector CT imaging of the abdomen and pelvis was performed using the standard protocol following bolus administration of intravenous contrast. RADIATION DOSE REDUCTION: This exam was performed according to the departmental dose-optimization program which includes automated exposure control, adjustment of the mA and/or kV according to patient size and/or use of iterative reconstruction technique. CONTRAST:  OMNIPAQUE IOHEXOL 300 MG/ML  SOLN COMPARISON:  CT abdomen and pelvis 01/17/2023 FINDINGS: Lower chest: There is a new trace left pleural effusion. Hepatobiliary: No focal liver abnormality is seen. No gallstones, gallbladder wall thickening, or biliary dilatation. Pancreas: Unremarkable. No pancreatic ductal dilatation or surrounding inflammatory changes. 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 Spleen is normal in size. Adrenals/Urinary Tract: Adrenal glands are unremarkable. Kidneys are normal, without renal calculi, focal lesion, or hydronephrosis. Bladder is unremarkable. Stomach/Bowel: Patient is status post gastric bypass. Again seen is wall thickening and edema of the small bowel loop extending from the gastrojejunal anastomosis. There is increased inflammatory stranding at the level of the anastomosis. There is no evidence for bowel obstruction, pneumatosis or free air. The appendix is not visualized. Vascular/Lymphatic: No significant vascular findings are present. No enlarged abdominal or pelvic lymph nodes. Reproductive: Status post hysterectomy. No adnexal masses. Other: There is new trace free fluid in the left upper quadrant and pelvis. No focal abdominal wall hernia. Musculoskeletal: Left-sided sacral stimulator device again seen. No acute osseous abnormality. IMPRESSION: 1. Status post gastric bypass. Again seen is wall thickening and edema of the small bowel loop extending from the gastrojejunal anastomosis. There is increased  inflammatory stranding at the level of the anastomosis. Findings are concerning for enteritis. No bowel obstruction or free air. 2. New trace free fluid in the left upper quadrant and pelvis. 3. New trace left pleural effusion. Electronically Signed   By: Darliss Cheney M.D.   On: 01/25/2023 23:12   DG Knee 1-2 Views Left  Result Date: 01/19/2023 CLINICAL DATA:  Bilateral knee pain after falling. EXAM: LEFT KNEE - 1-2 VIEW; RIGHT KNEE - 1-2 VIEW COMPARISON:  Right knee radiographs 08/23/2012. FINDINGS: Status post bilateral total knee arthroplasty. The hardware is intact without loosening. No evidence of acute fracture or dislocation. No significant joint effusion or focal soft tissue abnormality identified. There is some calcification anterior to the distal left femur which may be associated  with the quadriceps tendon. No surrounding soft tissue swelling to suggest acute quadriceps injury; correlate clinically. IMPRESSION: 1. No evidence of acute fracture or dislocation in either knee. 2. Intact bilateral total knee arthroplasties. 3. Nonspecific calcification anterior to the distal left femur as described. Electronically Signed   By: Carey Bullocks M.D.   On: 01/19/2023 14:04   DG Knee 1-2 Views Right  Result Date: 01/19/2023 CLINICAL DATA:  Bilateral knee pain after falling. EXAM: LEFT KNEE - 1-2 VIEW; RIGHT KNEE - 1-2 VIEW COMPARISON:  Right knee radiographs 08/23/2012. FINDINGS: Status post bilateral total knee arthroplasty. The hardware is intact without loosening. No evidence of acute fracture or dislocation. No significant joint effusion or focal soft tissue abnormality identified. There is some calcification anterior to the distal left femur which may be associated with the quadriceps tendon. No surrounding soft tissue swelling to suggest acute quadriceps injury; correlate clinically. IMPRESSION: 1. No evidence of acute fracture or dislocation in either knee. 2. Intact bilateral total knee  arthroplasties. 3. Nonspecific calcification anterior to the distal left femur as described. Electronically Signed   By: Carey Bullocks M.D.   On: 01/19/2023 14:04   CT HEAD WO CONTRAST ( )  Result Date: 01/18/2023 CLINICAL DATA:  Poly trauma EXAM: CT HEAD WITHOUT CONTRAST TECHNIQUE: Contiguous axial images were obtained from the base of the skull through the vertex without intravenous contrast. RADIATION DOSE REDUCTION: This exam was performed according to the departmental dose-optimization program which includes automated exposure control, adjustment of the mA and/or kV according to patient size and/or use of iterative reconstruction technique. COMPARISON:  11/27/2020 CT head, correlation is made with MRI head 07/12/2022 FINDINGS: Brain: No evidence of acute infarction, hemorrhage, mass, mass effect, or midline shift. No hydrocephalus or extra-axial fluid collection. Vascular: No hyperdense vessel. Skull: Negative for fracture or focal lesion. Sinuses/Orbits: No acute finding. Other: The mastoid air cells are well aerated. IMPRESSION: No acute intracranial process. Electronically Signed   By: Wiliam Ke M.D.   On: 01/18/2023 16:15   CT ABDOMEN PELVIS W CONTRAST  Result Date: 01/17/2023 CLINICAL DATA:  Bowel obstruction suspected EXAM: CT ABDOMEN AND PELVIS WITH CONTRAST TECHNIQUE: Multidetector CT imaging of the abdomen and pelvis was performed using the standard protocol following bolus administration of intravenous contrast. RADIATION DOSE REDUCTION: This exam was performed according to the departmental dose-optimization program which includes automated exposure control, adjustment of the mA and/or kV according to patient size and/or use of iterative reconstruction technique. CONTRAST:  OMNIPAQUE IOHEXOL 300 MG/ML  SOLN COMPARISON:  09/28/2021 FINDINGS: Lower chest: No acute abnormality Hepatobiliary: No focal hepatic abnormality. Gallbladder unremarkable. Pancreas: No focal abnormality or  ductal dilatation. Spleen: 2 cm low-density lesion in the spleen, stable since prior study, likely cyst. Normal size. Adrenals/Urinary Tract: No focal hepatic abnormality. Gallbladder unremarkable. Stomach/Bowel: Normal appendix. Prior gastric bypass. There is mild wall thickening within the jejunal loop of small bowel extending from the gastrojejunostomy. This may reflect enteritis. No evidence of bowel obstruction. Normal appendix. Large bowel unremarkable. Vascular/Lymphatic: No evidence of aneurysm or adenopathy. Reproductive: Prior hysterectomy.  No adnexal masses. Other: No free fluid or free air. Musculoskeletal: No acute bony abnormality. IMPRESSION: Prior gastric bypass. The small bowel loop extending from the stomach anastomosis appears thick walled. This may reflect enteritis. Remainder of the small bowel is unremarkable. No bowel obstruction. Electronically Signed   By: Charlett Nose M.D.   On: 01/17/2023 18:09      Subjective: Patient seen and examined at bedside.  Tolerating soft diet but still having intermittent abdominal cramps and nausea.  Feels okay to go home today.  No fever, chest pain or worsening shortness of breath reported.  Discharge Exam: Vitals:   01/28/23 2033 01/29/23 0403  BP: 106/61 107/71  Pulse: 87 79  Resp: 16 16  Temp: 98.2 F (36.8 C) 98 F (36.7 C)  SpO2: 97% 96%    General: Pt is alert, awake, not in acute distress.  On room air. Cardiovascular: rate controlled, S1/S2 + Respiratory: bilateral decreased breath sounds at bases Abdominal: Soft, mildly tender, mildly distended, bowel sounds + Extremities: no edema, no cyanosis    The results of significant diagnostics from this hospitalization (including imaging, microbiology, ancillary and laboratory) are listed below for reference.     Microbiology: Recent Results (from the past 240 hour(s))  Calprotectin, Fecal     Status: Abnormal   Collection Time: 01/26/23  7:47 AM   Specimen: Stool  Result  Value Ref Range Status   Calprotectin, Fecal 1,300 (H) 0 - 120 ug/g Final    Comment: (NOTE) **Results verified by repeat testing** Concentration     Interpretation   Follow-Up < 5 - 50 ug/g     Normal           None >50 -120 ug/g     Borderline       Re-evaluate in 4-6 weeks    >120 ug/g     Abnormal         Repeat as clinically                                   indicated Performed At: [facility] [address] [place], [place] [id number] Jolene Schimke MD WU:[id number]   Gastrointestinal Panel by PCR , Stool     Status: None   Collection Time: 01/27/23 11:44 AM   Specimen: Stool  Result Value Ref Range Status   Campylobacter species NOT DETECTED NOT DETECTED Final   Plesimonas shigelloides NOT DETECTED NOT DETECTED Final   Salmonella species NOT DETECTED NOT DETECTED Final   Yersinia enterocolitica NOT DETECTED NOT DETECTED Final   Vibrio species NOT DETECTED NOT DETECTED Final   Vibrio cholerae NOT DETECTED NOT DETECTED Final   Enteroaggregative E coli (EAEC) NOT DETECTED NOT DETECTED Final   Enteropathogenic E coli (EPEC) NOT DETECTED NOT DETECTED Final   Enterotoxigenic E coli (ETEC) NOT DETECTED NOT DETECTED Final   Shiga like toxin producing E coli (STEC) NOT DETECTED NOT DETECTED Final   Shigella/Enteroinvasive E coli (EIEC) NOT DETECTED NOT DETECTED Final   Cryptosporidium NOT DETECTED NOT DETECTED Final   Cyclospora cayetanensis NOT DETECTED NOT DETECTED Final   Entamoeba histolytica NOT DETECTED NOT DETECTED Final   Giardia lamblia NOT DETECTED NOT DETECTED Final   Adenovirus F40/41 NOT DETECTED NOT DETECTED Final   Astrovirus NOT DETECTED NOT DETECTED Final   Norovirus GI/GII NOT DETECTED NOT DETECTED Final   Rotavirus A NOT DETECTED NOT DETECTED Final   Sapovirus (I, II, IV, and V) NOT DETECTED NOT DETECTED Final    Comment: Performed at [facility], [address] [place], [place] [address]     Labs: BNP (last 3 results) No results for  input(s): "BNP" in the last 8760 hours. Basic Metabolic Panel: Recent Labs  Lab 01/25/23 2057 01/26/23 0112 01/28/23 0716  NA 137 137 139  K 3.4* 3.6 3.7  CL 101 102 105  CO2 26 24 27   GLUCOSE 95 82 77  BUN 13 12 5*  CREATININE 0.70 0.71 0.81  CALCIUM 8.9 8.6* 8.4*  MG  --   --  2.4   Liver Function Tests: Recent Labs  Lab 01/25/23 2057 01/28/23 0716  AST 11* 10*  ALT 8 7  ALKPHOS 56 48  BILITOT 0.5 0.3  PROT 6.4* 5.4*  ALBUMIN 3.0* 2.6*   Recent Labs  Lab 01/25/23 2057  LIPASE 21   No results for input(s): "AMMONIA" in the last 168 hours. CBC: Recent Labs  Lab 01/25/23 2057 01/26/23 0112 01/27/23 0604 01/28/23 0716  WBC 12.0* 12.0* 9.4 6.7  NEUTROABS 9.6*  --   --  3.6  HGB 10.6* 10.6* 9.0* 9.4*  HCT 33.2* 34.1* 28.7* 30.0*  MCV 96.0 98.6 98.3 95.8  PLT 503* 443* 431* 446*   Cardiac Enzymes: No results for input(s): "CKTOTAL", "CKMB", "CKMBINDEX", "TROPONINI" in the last 168 hours. BNP: Invalid input(s): "POCBNP" CBG: No results for input(s): "GLUCAP" in the last 168 hours. D-Dimer No results for input(s): "DDIMER" in the last 72 hours. Hgb A1c No results for input(s): "HGBA1C" in the last 72 hours. Lipid Profile No results for input(s): "CHOL", "HDL", "LDLCALC", "TRIG", "CHOLHDL", "LDLDIRECT" in the last 72 hours. Thyroid function studies No results for input(s): "TSH", "T4TOTAL", "T3FREE", "THYROIDAB" in the last 72 hours.  Invalid input(s): "FREET3" Anemia work up No results for input(s): "VITAMINB12", "FOLATE", "FERRITIN", "TIBC", "IRON", "RETICCTPCT" in the last 72 hours. Urinalysis    Component Value Date/Time   COLORURINE YELLOW 01/26/2023 0130   APPEARANCEUR CLEAR 01/26/2023 0130   APPEARANCEUR Cloudy (A) 12/15/2017 1722   LABSPEC >1.046 (H) 01/26/2023 0130   PHURINE 6.0 01/26/2023 0130   GLUCOSEU NEGATIVE 01/26/2023 0130   HGBUR NEGATIVE 01/26/2023 0130   BILIRUBINUR NEGATIVE 01/26/2023 0130   BILIRUBINUR negative 01/05/2020 1434    BILIRUBINUR Negative 12/15/2017 1722   KETONESUR 20 (A) 01/26/2023 0130   PROTEINUR NEGATIVE 01/26/2023 0130   UROBILINOGEN 0.2 01/05/2020 1434   UROBILINOGEN 0.2 11/29/2018 1742   NITRITE NEGATIVE 01/26/2023 0130   LEUKOCYTESUR NEGATIVE 01/26/2023 0130   Sepsis Labs Recent Labs  Lab 01/25/23 2057 01/26/23 0112 01/27/23 0604 01/28/23 0716  WBC 12.0* 12.0* 9.4 6.7   Microbiology Recent Results (from the past 240 hour(s))  Calprotectin, Fecal     Status: Abnormal   Collection Time: 01/26/23  7:47 AM   Specimen: Stool  Result Value Ref Range Status   Calprotectin, Fecal 1,300 (H) 0 - 120 ug/g Final    Comment: (NOTE) **Results verified by repeat testing** Concentration     Interpretation   Follow-Up < 5 - 50 ug/g     Normal           None >50 -120 ug/g     Borderline       Re-evaluate in 4-6 weeks    >120 ug/g     Abnormal         Repeat as clinically                                   indicated Performed At: [facility] [address] [place], [place] [id number] Jolene Schimke MD WU:[id number]   Gastrointestinal Panel by PCR , Stool     Status: None   Collection Time: 01/27/23 11:44 AM   Specimen: Stool  Result Value Ref Range Status   Campylobacter species NOT DETECTED NOT  DETECTED Final   Plesimonas shigelloides NOT DETECTED NOT DETECTED Final   Salmonella species NOT DETECTED NOT DETECTED Final   Yersinia enterocolitica NOT DETECTED NOT DETECTED Final   Vibrio species NOT DETECTED NOT DETECTED Final   Vibrio cholerae NOT DETECTED NOT DETECTED Final   Enteroaggregative E coli (EAEC) NOT DETECTED NOT DETECTED Final   Enteropathogenic E coli (EPEC) NOT DETECTED NOT DETECTED Final   Enterotoxigenic E coli (ETEC) NOT DETECTED NOT DETECTED Final   Shiga like toxin producing E coli (STEC) NOT DETECTED NOT DETECTED Final   Shigella/Enteroinvasive E coli (EIEC) NOT DETECTED NOT DETECTED Final   Cryptosporidium NOT DETECTED NOT DETECTED Final   Cyclospora  cayetanensis NOT DETECTED NOT DETECTED Final   Entamoeba histolytica NOT DETECTED NOT DETECTED Final   Giardia lamblia NOT DETECTED NOT DETECTED Final   Adenovirus F40/41 NOT DETECTED NOT DETECTED Final   Astrovirus NOT DETECTED NOT DETECTED Final   Norovirus GI/GII NOT DETECTED NOT DETECTED Final   Rotavirus A NOT DETECTED NOT DETECTED Final   Sapovirus (I, II, IV, and V) NOT DETECTED NOT DETECTED Final    Comment: Performed at [facility], [address], [place], [place] [address]     Time coordinating discharge: 35 minutes  SIGNED:   Glade Lloyd, MD  [facility] 01/29/2023, 12:49 PM

## 2023-01-29 NOTE — TOC Transition Note (Signed)
Transition of Care [facility]) - CM/SW Discharge Note   Patient Details  Name: [name] MRN: [id number] Date of Birth: Dec 19, 1975  [facility] CM/SW Contact:  Beckie Busing, RN Phone [phone]  01/29/2023, 12:52 PM   Clinical Narrative:    Patient with discharge orders. No TOC needs noted. TOC will sign off.    Final next level of care: Home/Self Care Barriers to Discharge: No Barriers Identified   Patient Goals and CMS Choice      Discharge Placement                         Discharge Plan and Services Additional resources added to the After Visit Summary for                  DME Arranged: N/A DME Agency: NA       HH Arranged: NA          Social Determinants of Health (SDOH) Interventions SDOH Screenings   Food Insecurity: No Food Insecurity (01/26/2023)  Housing: Low Risk  (01/26/2023)  Transportation Needs: No Transportation Needs (01/26/2023)  Utilities: Not At Risk (01/26/2023)  Alcohol Screen: Medium Risk (04/08/2019)  Depression (PHQ2-9): Low Risk  (01/05/2020)  Financial Resource Strain: Low Risk  (04/08/2019)  Physical Activity: Inactive (04/08/2019)  Social Connections: Socially Isolated (04/08/2019)  Stress: Stress Concern Present (04/08/2019)  Tobacco Use: Medium Risk (01/26/2023)     Readmission Risk Interventions     No data to display

## 2023-02-01 ENCOUNTER — Emergency Department (HOSPITAL_COMMUNITY): Payer: 59

## 2023-02-01 ENCOUNTER — Other Ambulatory Visit: Payer: Self-pay

## 2023-02-01 ENCOUNTER — Encounter (HOSPITAL_COMMUNITY): Payer: Self-pay

## 2023-02-01 ENCOUNTER — Emergency Department (HOSPITAL_COMMUNITY)
Admission: EM | Admit: 2023-02-01 | Discharge: 2023-02-01 | Disposition: A | Discharge status: Home / Self Care | Payer: 59 | Attending: Emergency Medicine | Admitting: Emergency Medicine

## 2023-02-01 DIAGNOSIS — J45909 Unspecified asthma, uncomplicated: Secondary | ICD-10-CM | POA: Insufficient documentation

## 2023-02-01 DIAGNOSIS — R55 Syncope and collapse: Secondary | ICD-10-CM | POA: Diagnosis not present

## 2023-02-01 DIAGNOSIS — W01198A Fall on same level from slipping, tripping and stumbling with subsequent striking against other object, initial encounter: Secondary | ICD-10-CM | POA: Diagnosis not present

## 2023-02-01 DIAGNOSIS — R42 Dizziness and giddiness: Secondary | ICD-10-CM | POA: Diagnosis present

## 2023-02-01 DIAGNOSIS — S0181XA Laceration without foreign body of other part of head, initial encounter: Secondary | ICD-10-CM | POA: Insufficient documentation

## 2023-02-01 DIAGNOSIS — M542 Cervicalgia: Secondary | ICD-10-CM | POA: Diagnosis not present

## 2023-02-01 DIAGNOSIS — Z7951 Long term (current) use of inhaled steroids: Secondary | ICD-10-CM | POA: Diagnosis not present

## 2023-02-01 DIAGNOSIS — R000 Tachycardia, unspecified: Secondary | ICD-10-CM | POA: Insufficient documentation

## 2023-02-01 LAB — COMPREHENSIVE METABOLIC PANEL
ALT: 8 U/L (ref 0–44)
AST: 9 U/L — ABNORMAL LOW (ref 15–41)
Albumin: 2.6 g/dL — ABNORMAL LOW (ref 3.5–5.0)
Alkaline Phosphatase: 48 U/L (ref 38–126)
Anion gap: 5 (ref 5–15)
BUN: 33 mg/dL — ABNORMAL HIGH (ref 6–20)
CO2: 26 mmol/L (ref 22–32)
Calcium: 7.8 mg/dL — ABNORMAL LOW (ref 8.9–10.3)
Chloride: 107 mmol/L (ref 98–111)
Creatinine, Ser: 0.75 mg/dL (ref 0.44–1.00)
GFR, Estimated: >60 mL/min (ref >60)
Glucose, Bld: 121 mg/dL — ABNORMAL HIGH (ref 70–99)
Potassium: 4.2 mmol/L (ref 3.5–5.1)
Sodium: 138 mmol/L (ref 135–145)
Total Bilirubin: 0.3 mg/dL (ref 0.3–1.2)
Total Protein: 5.5 g/dL — ABNORMAL LOW (ref 6.5–8.1)

## 2023-02-01 LAB — CBC
HCT: 33.3 % — ABNORMAL LOW (ref 36.0–46.0)
Hemoglobin: 10.2 g/dL — ABNORMAL LOW (ref 12.0–15.0)
MCH: 30.4 pg (ref 26.0–34.0)
MCHC: 30.6 g/dL (ref 30.0–36.0)
MCV: 99.4 fL (ref 80.0–100.0)
Platelets: 663 10*3/uL — ABNORMAL HIGH (ref 150–400)
RBC: 3.35 MIL/uL — ABNORMAL LOW (ref 3.87–5.11)
RDW: 14.7 % (ref 11.5–15.5)
WBC: 16 10*3/uL — ABNORMAL HIGH (ref 4.0–10.5)
nRBC: 0 % (ref 0.0–0.2)

## 2023-02-01 MED ORDER — DICYCLOMINE HCL 10 MG PO CAPS
10.0000 mg | ORAL_CAPSULE | Freq: Once | ORAL | Status: AC
  Administered 2023-02-01: 10 mg via ORAL
  Filled 2023-02-01: qty 1

## 2023-02-01 MED ORDER — ONDANSETRON HCL 4 MG/2ML IJ SOLN
4.0000 mg | Freq: Once | INTRAMUSCULAR | Status: AC
  Administered 2023-02-01: 4 mg via INTRAVENOUS
  Filled 2023-02-01: qty 2

## 2023-02-01 MED ORDER — HYDROMORPHONE HCL 2 MG PO TABS
4.0000 mg | ORAL_TABLET | Freq: Once | ORAL | Status: AC
  Administered 2023-02-01: 4 mg via ORAL
  Filled 2023-02-01: qty 2

## 2023-02-01 MED ORDER — HYDROMORPHONE HCL 1 MG/ML IJ SOLN
1.0000 mg | Freq: Once | INTRAMUSCULAR | Status: AC
  Administered 2023-02-01: 1 mg via INTRAVENOUS
  Filled 2023-02-01: qty 1

## 2023-02-01 MED ORDER — LIDOCAINE HCL (PF) 1 % IJ SOLN
5.0000 mL | Freq: Once | INTRAMUSCULAR | Status: AC
  Administered 2023-02-01: 5 mL
  Filled 2023-02-01: qty 30

## 2023-02-01 MED ORDER — SODIUM CHLORIDE 0.9 % IV BOLUS
1000.0000 mL | Freq: Once | INTRAVENOUS | Status: AC
  Administered 2023-02-01: 1000 mL via INTRAVENOUS

## 2023-02-01 MED ORDER — SUCRALFATE 1 G PO TABS
1.0000 g | ORAL_TABLET | Freq: Once | ORAL | Status: AC
  Administered 2023-02-01: 1 g via ORAL
  Filled 2023-02-01: qty 1

## 2023-02-01 MED ORDER — IOHEXOL 300 MG/ML  SOLN
75.0000 mL | Freq: Once | INTRAMUSCULAR | Status: AC | PRN
  Administered 2023-02-01: 75 mL via INTRAVENOUS

## 2023-02-01 NOTE — ED Triage Notes (Addendum)
BIBA from home c/o syncopal episode in bathroom.  BP with ems 90/50.  Pt reports admitted with EGD with multiple ulcers on 4/24.  Lac noted to chin

## 2023-02-01 NOTE — ED Notes (Signed)
Pt soiled clothes earlier, is awaiting her ride to bring her some clothes to wear home

## 2023-02-01 NOTE — ED Provider Notes (Signed)
Care of patient received from prior provider at 3:10 PM, please see their note for complete H/P and care plan.  Received handoff per ED course.  Clinical Course as of 02/01/23 1730  Mon Feb 01, 2023  1505 Stable Recent admit for Poor PO and complex GI problems thought to be 2/2 underlying UC vs bariatric surgery effects BIBA for syncope today Hit chin on sink with GLF Extensive CTS of OMF pending  [CC]  1506 Pending Cts likely OP. Labs close to baseline.  [CC]  1728 Reevaluated at bedside and in no acute distress.  Plan for  DC to F/U with PCP [CC]    Clinical Course User Index [CC] Glyn Ade, MD  Reassessment: Reassessed patient at bedside.  No acute distress. Patient feels comfortable with continued outpatient care and management.  CT scans reviewed with no focal pathology identified  Disposition:  I have considered need for hospitalization, however, considering all of the above, I believe this patient is stable for discharge at this time.  Patient/family educated about specific return precautions for given chief complaint and symptoms.  Patient/family educated about follow-up with PCP.     Patient/family expressed understanding of return precautions and need for follow-up. Patient spoken to regarding all imaging and laboratory results and appropriate follow up for these results. All education provided in verbal form with additional information in written form. Time was allowed for answering of patient questions. Patient discharged.    Emergency Department Medication Summary:   Medications  sodium chloride 0.9 % bolus 1,000 mL (0 mLs Intravenous Stopped 02/01/23 1152)  lidocaine (PF) (XYLOCAINE) 1 % injection 5 mL (5 mLs Infiltration Given 02/01/23 1053)  HYDROmorphone (DILAUDID) injection 1 mg (1 mg Intravenous Given 02/01/23 1121)  ondansetron (ZOFRAN) injection 4 mg (4 mg Intravenous Given 02/01/23 1120)  HYDROmorphone (DILAUDID) tablet 4 mg (4 mg Oral Given 02/01/23 1130)   sucralfate (CARAFATE) tablet 1 g (1 g Oral Given 02/01/23 1324)  dicyclomine (BENTYL) capsule 10 mg (10 mg Oral Given 02/01/23 1450)  HYDROmorphone (DILAUDID) tablet 4 mg (4 mg Oral Given 02/01/23 1450)  iohexol (OMNIPAQUE) 300 MG/ML solution 75 mL (75 mLs Intravenous Contrast Given 02/01/23 1506)           Glyn Ade, MD 02/01/23 1730

## 2023-02-01 NOTE — ED Provider Notes (Signed)
[place] EMERGENCY DEPARTMENT AT [facility] Provider Note   CSN: [id number] Arrival date & time: 02/01/23  4098     History  Chief Complaint  Patient presents with   Loss of Consciousness    [name] is a 47 y.o. female.   Loss of Consciousness .  Presents after syncopal episode.  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  Went home.  Has been having somewhat less oral intake but states she felt as if she is keeping up with her fluid requirement.  Was going to the bathroom today and then felt lightheaded and passed out.  Larey Seat forward hitting her face on the sink.  States pain in her jaw and neck.  States she was unconscious.  Continued stable abdominal pain.    Past Medical History:  Diagnosis Date   Anal fissure - posterior 10/16/2014    OCC  ISSUES   Anxiety    doesn't take anything   Asthma    has Albuterol inhaler as needed   Boil    on pubic area started septra 03-01-17 draining blood and pus   Bronchitis    Chronic headache disorder 07/22/2016   Clostridium difficile infection 04/20/2012   Colitis    Dehydration    Depression    Family history of adverse reaction to anesthesia    pt mom gets sick   GERD (gastroesophageal reflux disease)    takes Pantoprazole daily   Headache    Hiatal hernia    neuropathy - mild in arms and legs   History of blood transfusion    last transfusion was 04/04/2016=Benadryl was given d/t itching. States she always itches with transfusion.    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    Marginal ulcer 10/25/2017   Migraine    Motion sickness    Nausea    takes Zofran as needed   Nausea and vomiting    for 1 year   Obesity    Oligouria    Osteoarthritis    lower back, knees, wrists - no meds   Peripheral neuropathy 03/01/2019   Pneumonia 1997   Postoperative nausea and vomiting 01/21/2016   wants scopolamine patch   Right carpal tunnel syndrome 03/01/2019   SVD (spontaneous vaginal delivery)    x 4   Tingling    BOTH LOWER EXTREMETIES ALL THE TIME DUE TO RAPID WEIGHT LOSS   TOBACCO USER 10/02/2009   Qualifier: Diagnosis of  By: Carolyne Fiscal MD, Clayburn Pert     Transfusion history    last transfusion 6'16    Tremor 08/31/2018   UC (ulcerative colitis) (HCC)    supposed to be taking Lialda and Bentyl but has been off since gastric sleeve   Vertigo    doesn't take any meds    Home Medications Prior to Admission medications   Medication Sig Start Date End Date Taking? Authorizing Provider  albuterol (VENTOLIN HFA) 108 (90 Base) MCG/ACT inhaler Inhale 2  puffs into the lungs every 6 (six) hours as needed for wheezing or shortness of breath.   Yes [provider]  Atogepant (QULIPTA) 30 MG TABS Take 1 tablet (30 mg total) by mouth daily. 11/02/22  Yes Ocie Doyne, MD  atorvastatin (LIPITOR) 40 MG tablet Take 40 mg by mouth daily. 12/10/22  Yes [provider]  baclofen (LIORESAL) 10 MG tablet Take 1 tablet (10 mg total) by mouth in the morning, at noon, and at bedtime. Take 1 tablet (10 mg) in morning, noon and Take 2 tablets (20 mg) at bedtime 01/29/23  Yes Alekh, Kshitiz, MD  botulinum toxin Type A (BOTOX) 200 units injection INJECT 155 UNITS INTRAMUSCULARLY INTO HEAD AND NECK MUSCLES EVERY 3 MONTHS 11/23/22  Yes Levert Feinstein, MD  buPROPion (WELLBUTRIN XL) 150 MG 24 hr tablet Take 150 mg by mouth every morning. Take along with 300 mg=450 mg   Yes [provider]  buPROPion (WELLBUTRIN XL) 300 MG 24 hr tablet Take 300 mg by mouth every morning. Take along with 150  mg=450 mg 06/05/20  Yes [provider]  busPIRone (BUSPAR) 15 MG tablet Take 15 mg by mouth 3 (three) times daily.   Yes [provider]  Cholecalciferol (VITAMIN D3) 1.25 MG (50000 UT) CAPS Take 50,000 Units by mouth every 7 (seven) days.  04/03/19  Yes [provider]  cyanocobalamin (,VITAMIN B-12,) 1000 MCG/ML injection Inject 1,000 mcg into the muscle 2 (two) times a week.  04/06/19  Yes [provider]  diclofenac Sodium (VOLTAREN) 1 % GEL Apply topically 4 (four) times daily.   Yes [provider]  dicyclomine (BENTYL) 10 MG capsule Take 10 mg by mouth 2 (two) times daily as needed for spasms. 12/17/20  Yes [provider]  DULoxetine (CYMBALTA) 60 MG capsule Take 1 capsule (60 mg total) by mouth 2 (two) times daily. 04/10/19  Yes Aldean Baker, NP  ferrous sulfate 325 (65 FE) MG tablet Take 1 tablet (325 mg total) by mouth daily with breakfast. 01/29/23  Yes Glade Lloyd, MD  fluticasone (FLONASE) 50 MCG/ACT nasal spray Place 2 sprays into both nostrils daily as needed for allergies or rhinitis.   Yes [provider]  gabapentin (NEURONTIN) 400 MG capsule Take 400 mg by mouth 3 (three) times daily.   Yes [provider]  hydrocortisone (ANUSOL-HC) 2.5 % rectal cream Place 1 Application rectally daily as needed for hemorrhoids. 07/29/18  Yes [provider]  HYDROmorphone (DILAUDID) 4 MG tablet Take 1 tablet (4 mg total) by mouth every 3 (three) hours. 01/07/23  Yes   linaclotide (LINZESS) 290 MCG CAPS capsule Take 290 mcg by mouth daily before breakfast.   Yes [provider]  LORazepam (ATIVAN) 1 MG tablet Take 1 mg by mouth 2 (two) times daily as needed for anxiety.   Yes [provider]  magnesium oxide (MAG-OX) 400 (240 Mg) MG tablet Take 1 tablet by mouth daily. 12/19/22  Yes [provider]  meclizine (ANTIVERT) 25 MG tablet Take 25 mg by mouth 3 (three) times daily. 04/15/22  Yes [provider]  mesalamine (LIALDA) 1.2 g EC tablet Take 2.4 g by mouth daily with breakfast.   Yes [provider]  mirtazapine (REMERON) 15 MG tablet Take 15 mg by mouth at bedtime as needed (sleep).   Yes [provider]  Multiple Vitamin (MULTIVITAMIN WITH MINERALS) TABS tablet Take 1 tablet by mouth daily.   Yes [provider]  [facility] 4  MG/0.1ML LIQD nasal spray kit Place 1 spray into the nose daily as needed (overdose). 03/18/20  Yes [provider]  ondansetron (ZOFRAN) 8 MG tablet Take 8 mg by mouth every 8 (eight) hours as needed for nausea or vomiting.   Yes [provider]  oxybutynin (DITROPAN) 5 MG tablet Take 5 mg by mouth daily.   Yes [provider]  pantoprazole (PROTONIX) 40 MG tablet TAKE 1 TABLET BY MOUTH TWICE A DAY BEFORE A MEAL 02/22/20  Yes Iva Boop, MD  phentermine (ADIPEX-P) 37.5 MG tablet Take 37.5 mg by mouth daily before breakfast.   Yes [provider]  sucralfate (CARAFATE) 1 g tablet Take 1 tablet (1 g total) by mouth every 6 (six) hours. 01/21/23  Yes Arnetha Courser, MD  SUMAtriptan (IMITREX) 50 MG tablet Take 1 tablet (50 mg total) by mouth every 2 (two) hours as needed for migraine. May repeat in 2 hours if headache persists or recurs. 11/02/22  Yes Ocie Doyne, MD  SYMPROIC 0.2 MG TABS Take 1 tablet by mouth daily. 05/13/21  Yes [provider]  thiamine (VITAMIN B-1) 100 MG tablet Take 100 mg by mouth daily at 12 noon.    Yes [provider]  torsemide (DEMADEX) 10 MG tablet Take 10 mg by mouth daily. 11/10/21  Yes [provider]  valACYclovir (VALTREX) 1000 MG tablet Take 1,000 mg by mouth 2 (two) times daily.   Yes [provider]  EPINEPHrine 0.3 mg/0.3 mL IJ SOAJ injection Inject 0.3 mg into the muscle as needed for anaphylaxis. Patient not taking: Reported on 02/01/2023 04/12/21   Haskel Schroeder, PA-C  OZEMPIC, 2 MG/DOSE, 8 MG/3ML SOPN Inject 2 mg into  the skin once a week. Patient not taking: Reported on 02/01/2023    [provider]      Allergies    Ciprofloxacin, Coconut (cocos nucifera), Coconut oil, Morphine and related, Oxycodone, Oxycodone-acetaminophen, Doxycycline, Other, Penicillamine, Trazodone and nefazodone, Vistaril [hydroxyzine hcl], Silicone, Penicillins, and Tape    Review of Systems   Review of Systems  Cardiovascular:  Positive for syncope.    Physical Exam Updated Vital Signs BP 116/79   Pulse 94   Temp 98.3 F (36.8 C)   Resp 13   LMP 06/02/2017 Comment: hysterectomy 06/08/2017  SpO2 99%  Physical Exam Vitals and nursing note reviewed.  HENT:     Head:     Comments: Approximate 1.5 cm laceration under the jaw on the left anteriorly.  Some tenderness over the midline of the jaw.  Some tenderness anterior over the neck without deformity. Neck:     Comments: Mild posterior tenderness. Cardiovascular:     Rate and Rhythm: Tachycardia present.  Pulmonary:     Breath sounds: Normal breath sounds.  Musculoskeletal:     Cervical back: Tenderness present.  Skin:    General: Skin is warm.  Neurological:     Mental Status: She is alert and oriented to person, place, and time.     ED Results / Procedures / Treatments   Labs (all labs ordered are listed, but only abnormal results are displayed) Labs Reviewed  CBC - Abnormal; Notable for the following components:      Result Value   WBC 16.0 (*)    RBC 3.35 (*)    Hemoglobin 10.2 (*)    HCT 33.3 (*)    Platelets 663 (*)    All other components within normal limits  COMPREHENSIVE METABOLIC PANEL - Abnormal; Notable for  the following components:   Glucose, Bld 121 (*)    BUN 33 (*)    Calcium 7.8 (*)    Total Protein 5.5 (*)    Albumin 2.6 (*)    AST 9 (*)    All other components within normal limits    EKG None  Radiology No results found.  Procedures .Marland KitchenLaceration Repair  Date/Time: 02/01/2023 2:36 PM  Performed by: Benjiman Core, MD Authorized by: Benjiman Core, MD   Consent:    Consent obtained:  Verbal   Consent given by:  Patient   Risks discussed:  Infection, need for additional repair, nerve damage, poor wound healing, poor cosmetic result, pain, retained foreign body, vascular damage and tendon damage   Alternatives discussed:  No treatment and delayed treatment Anesthesia:    Anesthesia method:  Local infiltration   Local anesthetic:  Lidocaine 1% w/o epi Laceration details:    Location:  Face   Face location:  Chin   Length (cm):  1.5 Pre-procedure details:    Preparation:  Patient was prepped and draped in usual sterile fashion Exploration:    Limited defect created (wound extended): no     Hemostasis achieved with:  Direct pressure   Wound exploration: wound explored through full range of motion   Treatment:    Area cleansed with:  Saline   Amount of cleaning:  Standard Skin repair:    Repair method:  Sutures   Suture size:  5-0   Suture material:  Prolene   Suture technique:  Simple interrupted   Number of sutures:  5 Approximation:    Approximation:  Close Repair type:    Repair type:  Simple Post-procedure details:    Dressing:  Open (no dressing)   Procedure completion:  Tolerated well, no immediate complications     Medications Ordered in ED Medications  sodium chloride 0.9 % bolus 1,000 mL (0 mLs Intravenous Stopped 02/01/23 1152)  lidocaine (PF) (XYLOCAINE) 1 % injection 5 mL (5 mLs Infiltration Given 02/01/23 1053)  HYDROmorphone (DILAUDID) injection 1 mg (1 mg Intravenous Given 02/01/23 1121)  ondansetron (ZOFRAN) injection 4 mg (4 mg Intravenous Given 02/01/23 1120)  HYDROmorphone (DILAUDID) tablet 4 mg (4 mg Oral Given 02/01/23 1130)  sucralfate (CARAFATE) tablet 1 g (1 g Oral Given 02/01/23 1324)  dicyclomine (BENTYL) capsule 10 mg (10 mg Oral Given 02/01/23 1450)  HYDROmorphone (DILAUDID) tablet 4 mg (4 mg Oral Given 02/01/23 1450)    ED Course/ Medical Decision Making/  A&P                             Medical Decision Making Amount and/or Complexity of Data Reviewed Labs: ordered. Radiology: ordered.  Risk Prescription drug management.   Patient with syncope.  I think likely due to dehydration.  Has had decreased oral intake.  Began after going to the bathroom so may be a vagal component.  However did hit head neck and jaw.  Will get imaging.  Will give fluid bolus and check basic blood work.  Reviewed recent discharge note and reviewed CT scan and reviewed  Blood work overall reassuring.  Hemoglobin is higher than baseline although BUN is gone up a little bit.  Could have a component of dehydration.  Initial tachycardia and blood pressure improved with some fluids.  Sutured wounds.  Pending imaging at this time.  Care turned over to Dr.  Doran Durand  Final Clinical Impression(s) / ED Diagnoses Final diagnoses:  Syncope, unspecified syncope type  Facial laceration, initial encounter    Rx / DC Orders ED Discharge Orders     None         Benjiman Core, MD 02/01/23 1455

## 2023-02-01 NOTE — Discharge Instructions (Addendum)
Have the sutures taken out in 5 to 7 days. °

## 2023-02-01 NOTE — Telephone Encounter (Signed)
Called and spoke to [facility] to verify pt was all set for Botox. They stated everything had been verified and delivery was set up for 02/03/23. Sent MyChart message asking pt to reach out when she is ready to schedule, she is currently hospitalized.

## 2023-02-01 NOTE — ED Notes (Signed)
Patient transported to CT 

## 2023-02-02 NOTE — Telephone Encounter (Signed)
[name]- Pt missed last Botox dose that was due in March due to an issue with insurance. Is she able to be seen somewhere before July?

## 2023-02-02 NOTE — Telephone Encounter (Signed)
Not urgent, I told patient (see below) that Maralyn Sago is out and will review next week.

## 2023-02-04 NOTE — Telephone Encounter (Signed)
Received (1) 200 unit Botox from [facility].

## 2023-02-28 ENCOUNTER — Encounter (HOSPITAL_COMMUNITY): Payer: Self-pay

## 2023-02-28 ENCOUNTER — Other Ambulatory Visit: Payer: Self-pay

## 2023-02-28 ENCOUNTER — Emergency Department (HOSPITAL_COMMUNITY): Payer: 59

## 2023-02-28 ENCOUNTER — Emergency Department (HOSPITAL_COMMUNITY)
Admission: EM | Admit: 2023-02-28 | Discharge: 2023-02-28 | Disposition: A | Discharge status: Home / Self Care | Payer: 59 | Attending: Emergency Medicine | Admitting: Emergency Medicine

## 2023-02-28 DIAGNOSIS — S0083XA Contusion of other part of head, initial encounter: Secondary | ICD-10-CM | POA: Diagnosis not present

## 2023-02-28 DIAGNOSIS — Z7951 Long term (current) use of inhaled steroids: Secondary | ICD-10-CM | POA: Insufficient documentation

## 2023-02-28 DIAGNOSIS — S0993XA Unspecified injury of face, initial encounter: Secondary | ICD-10-CM | POA: Diagnosis present

## 2023-02-28 DIAGNOSIS — J45909 Unspecified asthma, uncomplicated: Secondary | ICD-10-CM | POA: Diagnosis not present

## 2023-02-28 DIAGNOSIS — W19XXXA Unspecified fall, initial encounter: Secondary | ICD-10-CM | POA: Diagnosis not present

## 2023-02-28 LAB — COMPREHENSIVE METABOLIC PANEL
ALT: 13 U/L (ref 0–44)
AST: 15 U/L (ref 15–41)
Albumin: 3.2 g/dL — ABNORMAL LOW (ref 3.5–5.0)
Alkaline Phosphatase: 62 U/L (ref 38–126)
Anion gap: 7 (ref 5–15)
BUN: 13 mg/dL (ref 6–20)
CO2: 24 mmol/L (ref 22–32)
Calcium: 8.4 mg/dL — ABNORMAL LOW (ref 8.9–10.3)
Chloride: 108 mmol/L (ref 98–111)
Creatinine, Ser: 0.89 mg/dL (ref 0.44–1.00)
GFR, Estimated: >60 mL/min (ref >60)
Glucose, Bld: 101 mg/dL — ABNORMAL HIGH (ref 70–99)
Potassium: 2.9 mmol/L — ABNORMAL LOW (ref 3.5–5.1)
Sodium: 139 mmol/L (ref 135–145)
Total Bilirubin: 0.2 mg/dL — ABNORMAL LOW (ref 0.3–1.2)
Total Protein: 6 g/dL — ABNORMAL LOW (ref 6.5–8.1)

## 2023-02-28 LAB — ETHANOL: Alcohol, Ethyl (B): <10 mg/dL (ref <10)

## 2023-02-28 LAB — CBC
HCT: 25.1 % — ABNORMAL LOW (ref 36.0–46.0)
Hemoglobin: 7.9 g/dL — ABNORMAL LOW (ref 12.0–15.0)
MCH: 30.3 pg (ref 26.0–34.0)
MCHC: 31.5 g/dL (ref 30.0–36.0)
MCV: 96.2 fL (ref 80.0–100.0)
Platelets: 345 10*3/uL (ref 150–400)
RBC: 2.61 MIL/uL — ABNORMAL LOW (ref 3.87–5.11)
RDW: 16.9 % — ABNORMAL HIGH (ref 11.5–15.5)
WBC: 7.4 10*3/uL (ref 4.0–10.5)
nRBC: 0 % (ref 0.0–0.2)

## 2023-02-28 LAB — BLOOD GAS, VENOUS
Acid-base deficit: 0.9 mmol/L (ref 0.0–2.0)
Bicarbonate: 24.8 mmol/L (ref 20.0–28.0)
O2 Saturation: 95.2 %
Patient temperature: 37
pCO2, Ven: 45 mmHg (ref 44–60)
pH, Ven: 7.35 (ref 7.25–7.43)
pO2, Ven: 66 mmHg — ABNORMAL HIGH (ref 32–45)

## 2023-02-28 MED ORDER — METOCLOPRAMIDE HCL 5 MG/ML IJ SOLN
10.0000 mg | Freq: Once | INTRAMUSCULAR | Status: AC
  Administered 2023-02-28: 10 mg via INTRAVENOUS
  Filled 2023-02-28: qty 2

## 2023-02-28 MED ORDER — SODIUM CHLORIDE 0.9 % IV BOLUS
1000.0000 mL | Freq: Once | INTRAVENOUS | Status: AC
  Administered 2023-02-28: 1000 mL via INTRAVENOUS

## 2023-02-28 NOTE — ED Provider Notes (Signed)
[place] EMERGENCY DEPARTMENT AT [facility] Provider Note   CSN: [id number] Arrival date & time: 02/28/23  1556     History {Add pertinent medical, surgical, social history, OB history to HPI:1} Chief Complaint  Patient presents with   Marletta Lor    [name] is a 47 y.o. female.   Fall          Home Medications Prior to Admission medications   Medication Sig Start Date End Date Taking? Authorizing Provider  albuterol (VENTOLIN HFA) 108 (90 Base) MCG/ACT inhaler Inhale 2 puffs into the lungs every 6 (six) hours as needed for wheezing or shortness of breath.    [provider]  Atogepant (QULIPTA) 30 MG TABS Take 1 tablet (30 mg total) by mouth daily. 11/02/22   Ocie Doyne, MD  atorvastatin (LIPITOR) 40 MG tablet Take 40 mg by mouth daily. 12/10/22   [provider]  baclofen (LIORESAL) 10 MG tablet Take 1 tablet (10 mg total) by mouth in the morning, at noon, and at bedtime. Take 1 tablet (10 mg) in morning, noon and Take 2 tablets (20 mg) at bedtime 01/29/23   Glade Lloyd, MD  botulinum toxin Type A (BOTOX) 200 units injection INJECT 155 UNITS INTRAMUSCULARLY INTO HEAD AND NECK MUSCLES EVERY 3 MONTHS 11/23/22   Levert Feinstein, MD  buPROPion (WELLBUTRIN XL) 150 MG 24 hr tablet Take 150 mg by mouth every morning. Take along with 300 mg=450 mg    [provider]  buPROPion (WELLBUTRIN XL) 300 MG 24 hr tablet Take 300 mg by mouth every morning. Take along with 150 mg=450 mg 06/05/20   [provider]  busPIRone (BUSPAR) 15 MG tablet Take 15 mg by mouth 3 (three) times daily.    [provider]  Cholecalciferol (VITAMIN D3) 1.25 MG (50000 UT) CAPS Take 50,000 Units by mouth every 7 (seven) days.  04/03/19   [provider]  cyanocobalamin (,VITAMIN B-12,) 1000 MCG/ML injection Inject 1,000 mcg into the muscle 2 (two) times a week.  04/06/19   [provider]  diclofenac Sodium (VOLTAREN) 1 % GEL Apply topically  4 (four) times daily.    [provider]  dicyclomine (BENTYL) 10 MG capsule Take 10 mg by mouth 2 (two) times daily as needed for spasms. 12/17/20   [provider]  DULoxetine (CYMBALTA) 60 MG capsule Take 1 capsule (60 mg total) by mouth 2 (two) times daily. 04/10/19   Aldean Baker, NP  EPINEPHrine 0.3 mg/0.3 mL IJ SOAJ injection Inject 0.3 mg into the muscle as needed for anaphylaxis. Patient not taking: Reported on 02/01/2023 04/12/21   Haskel Schroeder, PA-C  ferrous sulfate 325 (65 FE) MG tablet Take 1 tablet (325 mg total) by mouth daily with breakfast. 01/29/23   Glade Lloyd, MD  fluticasone (FLONASE) 50 MCG/ACT nasal spray Place 2 sprays into both nostrils daily as needed for allergies or rhinitis.    [provider]  gabapentin (NEURONTIN) 400 MG capsule Take 400 mg by mouth 3 (three) times daily.    [provider]  hydrocortisone (ANUSOL-HC) 2.5 % rectal cream Place 1 Application rectally daily as needed for hemorrhoids. 07/29/18   [provider]  HYDROmorphone (DILAUDID) 4 MG tablet Take 1 tablet (4 mg total) by mouth every 3 (three) hours. 01/07/23     linaclotide (LINZESS) 290 MCG CAPS capsule Take 290 mcg by mouth daily before breakfast.    [provider]  LORazepam (ATIVAN) 1 MG tablet Take  1 mg by mouth 2 (two) times daily as needed for anxiety.    [provider]  magnesium oxide (MAG-OX) 400 (240 Mg) MG tablet Take 1 tablet by mouth daily. 12/19/22   [provider]  meclizine (ANTIVERT) 25 MG tablet Take 25 mg by mouth 3 (three) times daily. 04/15/22   [provider]  mesalamine (LIALDA) 1.2 g EC tablet Take 2.4 g by mouth daily with breakfast.    [provider]  mirtazapine (REMERON) 15 MG tablet Take 15 mg by mouth at bedtime as needed (sleep).    [provider]  Multiple Vitamin (MULTIVITAMIN WITH MINERALS) TABS tablet Take 1 tablet by mouth daily.    [provider]   NARCAN 4 MG/0.1ML LIQD nasal spray kit Place 1 spray into the nose daily as needed (overdose). 03/18/20   [provider]  ondansetron (ZOFRAN) 8 MG tablet Take 8 mg by mouth every 8 (eight) hours as needed for nausea or vomiting.    [provider]  oxybutynin (DITROPAN) 5 MG tablet Take 5 mg by mouth daily.    [provider]  OZEMPIC, 2 MG/DOSE, 8 MG/3ML SOPN Inject 2 mg into the skin once a week. Patient not taking: Reported on 02/01/2023    [provider]  pantoprazole (PROTONIX) 40 MG tablet TAKE 1 TABLET BY MOUTH TWICE A DAY BEFORE A MEAL 02/22/20   Iva Boop, MD  phentermine (ADIPEX-P) 37.5 MG tablet Take 37.5 mg by mouth daily before breakfast.    [provider]  sucralfate (CARAFATE) 1 g tablet Take 1 tablet (1 g total) by mouth every 6 (six) hours. 01/21/23   Arnetha Courser, MD  SUMAtriptan (IMITREX) 50 MG tablet Take 1 tablet (50 mg total) by mouth every 2 (two) hours as needed for migraine. May repeat in 2 hours if headache persists or recurs. 11/02/22   Ocie Doyne, MD  SYMPROIC 0.2 MG TABS Take 1 tablet by mouth daily. 05/13/21   [provider]  thiamine (VITAMIN B-1) 100 MG tablet Take 100 mg by mouth daily at 12 noon.     [provider]  torsemide (DEMADEX) 10 MG tablet Take 10 mg by mouth daily. 11/10/21   [provider]  valACYclovir (VALTREX) 1000 MG tablet Take 1,000 mg by mouth 2 (two) times daily.    [provider]      Allergies    Ciprofloxacin, Coconut (cocos nucifera), Coconut oil, Morphine and codeine, Oxycodone, Oxycodone-acetaminophen, Doxycycline, Other, Penicillamine, Trazodone and nefazodone, Vistaril [hydroxyzine hcl], Silicone, Penicillins, and Tape    Review of Systems   Review of Systems  Physical Exam Updated Vital Signs BP 111/66   Pulse 80   Temp 98.5 F (36.9 C) (Oral)   Resp 16   Ht 5\' 7"  (1.702 m)   Wt 76 kg   LMP 06/02/2017 Comment: hysterectomy 06/08/2017   SpO2 99%   BMI 26.24 kg/m  Physical Exam  ED Results / Procedures / Treatments   Labs (all labs ordered are listed, but only abnormal results are displayed) Labs Reviewed  CBC - Abnormal; Notable for the following components:      Result Value   RBC 2.61 (*)    Hemoglobin 7.9 (*)    HCT 25.1 (*)    RDW 16.9 (*)    All other components within normal limits  COMPREHENSIVE METABOLIC PANEL - Abnormal; Notable for the following components:   Potassium 2.9 (*)    Glucose, Bld 101 (*)  Calcium 8.4 (*)    Total Protein 6.0 (*)    Albumin 3.2 (*)    Total Bilirubin 0.2 (*)    All other components within normal limits  BLOOD GAS, VENOUS - Abnormal; Notable for the following components:   pO2, Ven 66 (*)    All other components within normal limits  ETHANOL  RAPID URINE DRUG SCREEN, HOSP PERFORMED    EKG None  Radiology CT Head Wo Contrast  Result Date: 02/28/2023 CLINICAL DATA:  Head trauma EXAM: CT HEAD WITHOUT CONTRAST TECHNIQUE: Contiguous axial images were obtained from the base of the skull through the vertex without intravenous contrast. RADIATION DOSE REDUCTION: This exam was performed according to the departmental dose-optimization program which includes automated exposure control, adjustment of the mA and/or kV according to patient size and/or use of iterative reconstruction technique. COMPARISON:  MRI brain 07/12/2022 FINDINGS: Brain: No evidence of acute infarction, hemorrhage, hydrocephalus, or extra-axial fluid collection. 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 Vascular: No hyperdense vessel or unexpected calcification. Skull: Normal. Negative for fracture or focal lesion. Sinuses/Orbits: There is left periorbital soft tissue swelling. Paranasal sinuses are clear. Postseptal orbital soft tissues are within normal limits. Other: None. IMPRESSION: 1. No acute intracranial process. 2. Left periorbital soft tissue  swelling. Electronically Signed   By: Darliss Cheney M.D.   On: 02/28/2023 18:09   CT Cervical Spine Wo Contrast  Result Date: 02/28/2023 CLINICAL DATA:  Trauma, fall EXAM: CT CERVICAL SPINE WITHOUT CONTRAST TECHNIQUE: Multidetector CT imaging of the cervical spine was performed without intravenous contrast. Multiplanar CT image reconstructions were also generated. RADIATION DOSE REDUCTION: This exam was performed according to the departmental dose-optimization program which includes automated exposure control, adjustment of the mA and/or kV according to patient size and/or use of iterative reconstruction technique. COMPARISON:  02/01/2023 FINDINGS: Alignment: Alignment of posterior margins of portable bodies is unremarkable. There is mild dextroscoliosis at cervicothoracic junction. Skull base and vertebrae: No recent fracture is seen. Calcification is noted along the anterior aspect of C5-C6 disc space. Soft tissues and spinal canal: There is no central spinal stenosis. Disc levels:  There is no significant narrowing of neural foramina. Upper chest: Visualized upper lung fields are clear. Other: Unremarkable. IMPRESSION: No recent fracture is seen in cervical spine. Electronically Signed   By: Ernie Avena M.D.   On: 02/28/2023 18:09    Procedures Procedures  {Document cardiac monitor, telemetry assessment procedure when appropriate:1}  Medications Ordered in ED Medications  sodium chloride 0.9 % bolus 1,000 mL (1,000 mLs Intravenous New Bag/Given 02/28/23 1714)  metoCLOPramide (REGLAN) injection 10 mg (10 mg Intravenous Given 02/28/23 1713)    ED Course/ Medical Decision Making/ A&P   {   Click here for ABCD2, HEART and other calculatorsREFRESH Note before signing :1}                          Medical Decision Making Amount and/or Complexity of Data Reviewed Labs: ordered. Radiology: ordered.  Risk Prescription drug management.   ***  {Document critical care time when  appropriate:1} {Document review of labs and clinical decision tools ie heart score, Chads2Vasc2 etc:1}  {Document your independent review of radiology images, and any outside records:1} {Document your discussion with family members, caretakers, and with consultants:1} {Document social determinants of health affecting pt's care:1} {Document your decision making why or why not admission, treatments were needed:1} Final Clinical Impression(s) / ED Diagnoses Final  diagnoses:  None    Rx / DC Orders ED Discharge Orders     None

## 2023-02-28 NOTE — Discharge Instructions (Addendum)
Tylenol and ibuprofen for face pain.  You do have a large contusion over your left eyelid.  This should improve with time cool compresses can help.  I would strongly recommend that you stop taking baclofen as this is a severely sedating medication and it sounds like you have had some falls and been overly sedated at home.  I will let you feel comfortable going home at this time however I do strongly recommend that you monitor your symptoms and return to emergency room for any new or worsening symptoms such as more confusion or drowsiness.

## 2023-02-28 NOTE — ED Triage Notes (Signed)
Patient fell 3 times today, drowsy in triage, unable to stay awake during questions. Unknown what patient hit but her left eye is swollen. Denies blood thinner. Stated she has had 3 dizzy spells that caused her to fall.

## 2023-03-08 ENCOUNTER — Other Ambulatory Visit (HOSPITAL_BASED_OUTPATIENT_CLINIC_OR_DEPARTMENT_OTHER): Payer: Self-pay

## 2023-03-08 MED ORDER — HYDROMORPHONE HCL 8 MG PO TABS
4.0000 mg | ORAL_TABLET | ORAL | 0 refills | Status: DC
  Filled 2023-03-08: qty 120, 30d supply, fill #0

## 2023-03-09 ENCOUNTER — Other Ambulatory Visit (HOSPITAL_COMMUNITY): Payer: Self-pay

## 2023-04-06 ENCOUNTER — Other Ambulatory Visit: Payer: Self-pay

## 2023-04-14 ENCOUNTER — Other Ambulatory Visit (HOSPITAL_BASED_OUTPATIENT_CLINIC_OR_DEPARTMENT_OTHER): Payer: Self-pay

## 2023-04-14 MED ORDER — HYDROMORPHONE HCL 4 MG PO TABS
4.0000 mg | ORAL_TABLET | Freq: Three times a day (TID) | ORAL | 0 refills | Status: DC | PRN
  Filled 2023-04-14: qty 21, 7d supply, fill #0

## 2023-05-03 NOTE — Telephone Encounter (Signed)
LVM and sent mychart msg informing pt of r/s needed for tomorrow's Botox- [name] out.

## 2023-05-04 ENCOUNTER — Ambulatory Visit: Payer: 59 | Admitting: Neurology

## 2023-05-05 ENCOUNTER — Ambulatory Visit (INDEPENDENT_AMBULATORY_CARE_PROVIDER_SITE_OTHER): Payer: 59 | Admitting: Neurology

## 2023-05-05 VITALS — BP 122/94 | HR 93

## 2023-05-05 DIAGNOSIS — G43711 Chronic migraine without aura, intractable, with status migrainosus: Secondary | ICD-10-CM | POA: Diagnosis not present

## 2023-05-05 MED ORDER — QULIPTA 30 MG PO TABS: 30.0000 mg | ORAL_TABLET | Freq: Every day | ORAL | 6 refills | Status: DC

## 2023-05-05 MED ORDER — SUMATRIPTAN SUCCINATE 50 MG PO TABS: 50.0000 mg | ORAL_TABLET | ORAL | 6 refills | Status: DC | PRN

## 2023-05-05 MED ORDER — ONABOTULINUMTOXINA 200 UNITS IJ SOLR
155.0000 [IU] | Freq: Once | INTRAMUSCULAR | Status: AC
Start: 2023-05-05 — End: 2023-05-05
  Administered 2023-05-05: 155 [IU] via INTRAMUSCULAR

## 2023-05-05 NOTE — Progress Notes (Signed)
Botox- 200 units x 1 vial Lot: [id number] Expiration: 10.2026 NDC: 0023-3921-02  Bacteriostatic 0.9% Sodium Chloride- 4 mL  Lot: [id number] Expiration: 11.01.2025 NDC: 5784696295  Dx: G43.709 S/P Witnessed by Archer Asa

## 2023-05-05 NOTE — Progress Notes (Signed)
   BOTOX PROCEDURE NOTE FOR MIGRAINE HEADACHE  HISTORY: [name] is here for Botox.  Her last injection was in December 2023. She saw Dr. Delena Bali in Feb 2024, started Qulipta 30 mg daily, continue Botox every 3 months.  Start Imitrex 50 mg as needed. Still having near daily migraine headache. Imitrex helps, needs a refill.   Description of procedure:  The patient was placed in a sitting position. The standard protocol was used for Botox as follows, with 5 units of Botox injected at each site:  -Procerus muscle, midline injection  -Corrugator muscle, bilateral injection  -Frontalis muscle, bilateral injection, with 2 sites each side, medial injection was performed in the upper one third of the frontalis muscle, in the region vertical from the medial inferior edge of the superior orbital rim. The lateral injection was again in the upper one third of the forehead vertically above the lateral limbus of the cornea, 1.5 cm lateral to the medial injection site.  -Temporalis muscle injection, 4 sites, bilaterally. The first injection was 3 cm above the tragus of the ear, second injection site was 1.5 cm to 3 cm up from the first injection site in line with the tragus of the ear. The third injection site was 1.5-3 cm forward between the first 2 injection sites. The fourth injection site was 1.5 cm posterior to the second injection site.  -Occipitalis muscle injection, 3 sites, bilaterally. The first injection was done one half way between the occipital protuberance and the tip of the mastoid process behind the ear. The second injection site was done lateral and superior to the first, 1 fingerbreadth from the first injection. The third injection site was 1 fingerbreadth superiorly and medially from the first injection site.  -Cervical paraspinal muscle injection, 2 sites, bilateral, the first injection site was 1 cm from the midline of the cervical spine, 3 cm inferior to the lower border of the occipital  protuberance. The second injection site was 1.5 cm superiorly and laterally to the first injection site.  -Trapezius muscle injection was performed at 3 sites, bilaterally. The first injection site was in the upper trapezius muscle halfway between the inflection point of the neck, and the acromion. The second injection site was one half way between the acromion and the first injection site. The third injection was done between the first injection site and the inflection point of the neck.  A 200 unit bottle of Botox was used, 155 units were injected, the rest of the Botox was wasted. The patient tolerated the procedure well, there were no complications of the above procedure.  Botox NDC 1610-9604-54 Lot number [id number] Expiration date 06/28/2025 SP  Meds ordered this encounter  Medications   botulinum toxin Type A (BOTOX) injection 155 Units    Botox- 200 units x 1 vial Lot: [id number] Expiration: 10.2026 NDC: 0023-3921-02   SUMAtriptan (IMITREX) 50 MG tablet    Sig: Take 1 tablet (50 mg total) by mouth every 2 (two) hours as needed for migraine. May repeat in 2 hours if headache persists or recurs.    Dispense:  10 tablet    Refill:  6   Atogepant (QULIPTA) 30 MG TABS    Sig: Take 1 tablet (30 mg total) by mouth daily.    Dispense:  30 tablet    Refill:  6

## 2023-05-25 ENCOUNTER — Other Ambulatory Visit: Payer: Self-pay | Admitting: Endocrinology

## 2023-05-25 DIAGNOSIS — Z1231 Encounter for screening mammogram for malignant neoplasm of breast: Secondary | ICD-10-CM

## 2023-05-27 ENCOUNTER — Ambulatory Visit
Admission: RE | Admit: 2023-05-27 | Discharge: 2023-05-27 | Disposition: A | Discharge status: Home / Self Care | Payer: 59 | Source: Ambulatory Visit

## 2023-05-27 DIAGNOSIS — Z1231 Encounter for screening mammogram for malignant neoplasm of breast: Secondary | ICD-10-CM

## 2023-07-06 NOTE — Telephone Encounter (Signed)
Completed auth renewal via [employer] portal, [place] was approved.   Auth#: [id number] (07/06/23-07/05/24)

## 2023-07-13 NOTE — Telephone Encounter (Signed)
Received a call from [employer]. She states that during benefit review they discovered that as of 04/29/2023 pt has Medicare A+B as primary insurance. I submitted a new auth through [facility] portal for buy/bill, received instant approval. No auth required for Medicare.   Auth#: [id number] (07/13/23-07/12/24)

## 2023-07-25 ENCOUNTER — Encounter (HOSPITAL_COMMUNITY): Payer: Self-pay

## 2023-07-25 ENCOUNTER — Observation Stay (HOSPITAL_BASED_OUTPATIENT_CLINIC_OR_DEPARTMENT_OTHER): Payer: Medicare Other | Admitting: Certified Registered"

## 2023-07-25 ENCOUNTER — Emergency Department (HOSPITAL_COMMUNITY): Payer: Medicare Other

## 2023-07-25 ENCOUNTER — Observation Stay (HOSPITAL_COMMUNITY): Payer: Medicare Other | Admitting: Certified Registered"

## 2023-07-25 ENCOUNTER — Other Ambulatory Visit: Payer: Self-pay

## 2023-07-25 ENCOUNTER — Encounter (HOSPITAL_COMMUNITY)
Admission: EM | Disposition: A | Discharge status: Home / Self Care | Payer: Self-pay | Source: Home / Self Care | Attending: Internal Medicine

## 2023-07-25 ENCOUNTER — Inpatient Hospital Stay (HOSPITAL_COMMUNITY)
Admission: EM | Admit: 2023-07-25 | Discharge: 2023-08-05 | DRG: 378 | Disposition: A | Discharge status: Home / Self Care | Payer: Medicare Other | Attending: Family Medicine | Admitting: Family Medicine

## 2023-07-25 DIAGNOSIS — Z885 Allergy status to narcotic agent status: Secondary | ICD-10-CM

## 2023-07-25 DIAGNOSIS — F413 Other mixed anxiety disorders: Secondary | ICD-10-CM

## 2023-07-25 DIAGNOSIS — G43909 Migraine, unspecified, not intractable, without status migrainosus: Secondary | ICD-10-CM | POA: Diagnosis present

## 2023-07-25 DIAGNOSIS — Z980 Intestinal bypass and anastomosis status: Secondary | ICD-10-CM

## 2023-07-25 DIAGNOSIS — K254 Chronic or unspecified gastric ulcer with hemorrhage: Principal | ICD-10-CM | POA: Diagnosis present

## 2023-07-25 DIAGNOSIS — Z79899 Other long term (current) drug therapy: Secondary | ICD-10-CM

## 2023-07-25 DIAGNOSIS — D75839 Thrombocytosis, unspecified: Secondary | ICD-10-CM | POA: Diagnosis present

## 2023-07-25 DIAGNOSIS — F419 Anxiety disorder, unspecified: Secondary | ICD-10-CM | POA: Diagnosis present

## 2023-07-25 DIAGNOSIS — R54 Age-related physical debility: Secondary | ICD-10-CM | POA: Diagnosis present

## 2023-07-25 DIAGNOSIS — Z880 Allergy status to penicillin: Secondary | ICD-10-CM

## 2023-07-25 DIAGNOSIS — I951 Orthostatic hypotension: Secondary | ICD-10-CM | POA: Insufficient documentation

## 2023-07-25 DIAGNOSIS — Z833 Family history of diabetes mellitus: Secondary | ICD-10-CM

## 2023-07-25 DIAGNOSIS — K219 Gastro-esophageal reflux disease without esophagitis: Secondary | ICD-10-CM | POA: Diagnosis present

## 2023-07-25 DIAGNOSIS — Z888 Allergy status to other drugs, medicaments and biological substances status: Secondary | ICD-10-CM

## 2023-07-25 DIAGNOSIS — K449 Diaphragmatic hernia without obstruction or gangrene: Secondary | ICD-10-CM | POA: Diagnosis present

## 2023-07-25 DIAGNOSIS — K920 Hematemesis: Principal | ICD-10-CM

## 2023-07-25 DIAGNOSIS — Z96653 Presence of artificial knee joint, bilateral: Secondary | ICD-10-CM | POA: Diagnosis present

## 2023-07-25 DIAGNOSIS — Z91048 Other nonmedicinal substance allergy status: Secondary | ICD-10-CM

## 2023-07-25 DIAGNOSIS — F32A Depression, unspecified: Secondary | ICD-10-CM | POA: Diagnosis present

## 2023-07-25 DIAGNOSIS — D72829 Elevated white blood cell count, unspecified: Secondary | ICD-10-CM | POA: Diagnosis present

## 2023-07-25 DIAGNOSIS — Z8249 Family history of ischemic heart disease and other diseases of the circulatory system: Secondary | ICD-10-CM

## 2023-07-25 DIAGNOSIS — D62 Acute posthemorrhagic anemia: Secondary | ICD-10-CM | POA: Diagnosis present

## 2023-07-25 DIAGNOSIS — Z9884 Bariatric surgery status: Secondary | ICD-10-CM

## 2023-07-25 DIAGNOSIS — K922 Gastrointestinal hemorrhage, unspecified: Secondary | ICD-10-CM | POA: Diagnosis present

## 2023-07-25 DIAGNOSIS — Z87891 Personal history of nicotine dependence: Secondary | ICD-10-CM

## 2023-07-25 DIAGNOSIS — K5904 Chronic idiopathic constipation: Secondary | ICD-10-CM | POA: Diagnosis present

## 2023-07-25 DIAGNOSIS — Z860100 Personal history of colon polyps, unspecified: Secondary | ICD-10-CM

## 2023-07-25 DIAGNOSIS — Z881 Allergy status to other antibiotic agents status: Secondary | ICD-10-CM

## 2023-07-25 DIAGNOSIS — J45909 Unspecified asthma, uncomplicated: Secondary | ICD-10-CM | POA: Diagnosis present

## 2023-07-25 DIAGNOSIS — Z825 Family history of asthma and other chronic lower respiratory diseases: Secondary | ICD-10-CM

## 2023-07-25 HISTORY — PX: ESOPHAGOGASTRODUODENOSCOPY: SHX5428

## 2023-07-25 HISTORY — PX: HEMOSTASIS CLIP PLACEMENT: SHX6857

## 2023-07-25 HISTORY — PX: HOT HEMOSTASIS: SHX5433

## 2023-07-25 LAB — COMPREHENSIVE METABOLIC PANEL
ALT: 17 U/L (ref 0–44)
AST: 15 U/L (ref 15–41)
Albumin: 3.6 g/dL (ref 3.5–5.0)
Alkaline Phosphatase: 67 U/L (ref 38–126)
Anion gap: 8 (ref 5–15)
BUN: 25 mg/dL — ABNORMAL HIGH (ref 6–20)
CO2: 27 mmol/L (ref 22–32)
Calcium: 9.4 mg/dL (ref 8.9–10.3)
Chloride: 106 mmol/L (ref 98–111)
Creatinine, Ser: 0.65 mg/dL (ref 0.44–1.00)
GFR, Estimated: >60 mL/min (ref >60)
Glucose, Bld: 118 mg/dL — ABNORMAL HIGH (ref 70–99)
Potassium: 3.6 mmol/L (ref 3.5–5.1)
Sodium: 141 mmol/L (ref 135–145)
Total Bilirubin: 0.6 mg/dL (ref 0.3–1.2)
Total Protein: 7.2 g/dL (ref 6.5–8.1)

## 2023-07-25 LAB — TYPE AND SCREEN
ABO/RH(D): O POS
Antibody Screen: NEGATIVE

## 2023-07-25 LAB — URINALYSIS, ROUTINE W REFLEX MICROSCOPIC
Bacteria, UA: NONE SEEN
Glucose, UA: NEGATIVE mg/dL
Hgb urine dipstick: NEGATIVE
Ketones, ur: 5 mg/dL — AB
Nitrite: NEGATIVE
Protein, ur: 100 mg/dL — AB
Specific Gravity, Urine: 1.036 — ABNORMAL HIGH (ref 1.005–1.030)
Squamous Epithelial / HPF: >50 /[HPF] (ref 0–5)
pH: 5 (ref 5.0–8.0)

## 2023-07-25 LAB — HEMOGLOBIN AND HEMATOCRIT, BLOOD
HCT: 31 % — ABNORMAL LOW (ref 36.0–46.0)
HCT: 32.1 % — ABNORMAL LOW (ref 36.0–46.0)
Hemoglobin: 9.8 g/dL — ABNORMAL LOW (ref 12.0–15.0)
Hemoglobin: 9.9 g/dL — ABNORMAL LOW (ref 12.0–15.0)

## 2023-07-25 LAB — CBC
HCT: 38.6 % (ref 36.0–46.0)
Hemoglobin: 12.1 g/dL (ref 12.0–15.0)
MCH: 28.9 pg (ref 26.0–34.0)
MCHC: 31.3 g/dL (ref 30.0–36.0)
MCV: 92.1 fL (ref 80.0–100.0)
Platelets: 405 10*3/uL — ABNORMAL HIGH (ref 150–400)
RBC: 4.19 MIL/uL (ref 3.87–5.11)
RDW: 15.9 % — ABNORMAL HIGH (ref 11.5–15.5)
WBC: 10.9 10*3/uL — ABNORMAL HIGH (ref 4.0–10.5)
nRBC: 0 % (ref 0.0–0.2)

## 2023-07-25 LAB — LIPASE, BLOOD: Lipase: 30 U/L (ref 11–51)

## 2023-07-25 SURGERY — EGD (ESOPHAGOGASTRODUODENOSCOPY)
Anesthesia: Monitor Anesthesia Care

## 2023-07-25 MED ORDER — PANTOPRAZOLE SODIUM 40 MG IV SOLR: 40.0000 mg | INTRAVENOUS | Status: DC

## 2023-07-25 MED ORDER — CLONAZEPAM 1 MG PO TABS
1.0000 mg | ORAL_TABLET | Freq: Two times a day (BID) | ORAL | Status: DC | PRN
  Administered 2023-07-25 – 2023-08-04 (×14): 1 mg via ORAL
  Filled 2023-07-25 (×14): qty 1

## 2023-07-25 MED ORDER — PANTOPRAZOLE SODIUM 40 MG IV SOLR
40.0000 mg | Freq: Once | INTRAVENOUS | Status: AC
  Administered 2023-07-25: 40 mg via INTRAVENOUS
  Filled 2023-07-25: qty 10

## 2023-07-25 MED ORDER — FENTANYL CITRATE (PF) 100 MCG/2ML IJ SOLN
25.0000 ug | INTRAMUSCULAR | Status: DC | PRN
Start: 2023-07-25 — End: 2023-07-25

## 2023-07-25 MED ORDER — MIRTAZAPINE 15 MG PO TABS
15.0000 mg | ORAL_TABLET | Freq: Every evening | ORAL | Status: DC | PRN
  Administered 2023-07-28 – 2023-08-02 (×5): 15 mg via ORAL
  Filled 2023-07-25 (×6): qty 1

## 2023-07-25 MED ORDER — LINACLOTIDE 145 MCG PO CAPS
290.0000 ug | ORAL_CAPSULE | Freq: Every day | ORAL | Status: DC
  Administered 2023-07-25 – 2023-08-05 (×12): 290 ug via ORAL
  Filled 2023-07-25 (×12): qty 2

## 2023-07-25 MED ORDER — AMISULPRIDE (ANTIEMETIC) 5 MG/2ML IV SOLN
10.0000 mg | Freq: Once | INTRAVENOUS | Status: AC | PRN
  Administered 2023-07-25: 10 mg via INTRAVENOUS

## 2023-07-25 MED ORDER — PROPOFOL 500 MG/50ML IV EMUL
INTRAVENOUS | Status: DC | PRN
  Administered 2023-07-25: 150 ug/kg/min via INTRAVENOUS

## 2023-07-25 MED ORDER — PROPOFOL 10 MG/ML IV BOLUS
INTRAVENOUS | Status: AC
  Filled 2023-07-25: qty 20

## 2023-07-25 MED ORDER — LACTATED RINGERS IV BOLUS
1000.0000 mL | Freq: Once | INTRAVENOUS | Status: AC
  Administered 2023-07-25: 1000 mL via INTRAVENOUS

## 2023-07-25 MED ORDER — ONDANSETRON HCL 4 MG/2ML IJ SOLN
4.0000 mg | Freq: Once | INTRAMUSCULAR | Status: DC | PRN
Start: 2023-07-25 — End: 2023-08-05

## 2023-07-25 MED ORDER — FLUTICASONE PROPIONATE 50 MCG/ACT NA SUSP: 2.0000 | Freq: Every day | NASAL | Status: DC | PRN

## 2023-07-25 MED ORDER — HYDROMORPHONE HCL 2 MG PO TABS: 4.0000 mg | ORAL_TABLET | ORAL | Status: DC | PRN

## 2023-07-25 MED ORDER — FENTANYL CITRATE PF 50 MCG/ML IJ SOSY
50.0000 ug | PREFILLED_SYRINGE | Freq: Once | INTRAMUSCULAR | Status: AC
  Administered 2023-07-25: 50 ug via INTRAVENOUS
  Filled 2023-07-25: qty 1

## 2023-07-25 MED ORDER — ONDANSETRON HCL 4 MG/2ML IJ SOLN
4.0000 mg | Freq: Four times a day (QID) | INTRAMUSCULAR | Status: DC | PRN
  Administered 2023-07-25 – 2023-08-04 (×10): 4 mg via INTRAVENOUS
  Filled 2023-07-25 (×10): qty 2

## 2023-07-25 MED ORDER — SODIUM CHLORIDE 0.9 % IV SOLN: INTRAVENOUS | Status: DC | PRN

## 2023-07-25 MED ORDER — ONDANSETRON HCL 4 MG PO TABS
4.0000 mg | ORAL_TABLET | Freq: Four times a day (QID) | ORAL | Status: DC | PRN
  Administered 2023-07-30 – 2023-08-02 (×3): 4 mg via ORAL
  Filled 2023-07-25 (×4): qty 1

## 2023-07-25 MED ORDER — ONDANSETRON 4 MG PO TBDP
4.0000 mg | ORAL_TABLET | Freq: Once | ORAL | Status: AC | PRN
  Administered 2023-07-25: 4 mg via ORAL
  Filled 2023-07-25: qty 1

## 2023-07-25 MED ORDER — HYDROMORPHONE HCL 1 MG/ML IJ SOLN
0.5000 mg | Freq: Once | INTRAMUSCULAR | Status: AC
  Administered 2023-07-25: 0.5 mg via INTRAVENOUS
  Filled 2023-07-25: qty 1

## 2023-07-25 MED ORDER — BUSPIRONE HCL 5 MG PO TABS
15.0000 mg | ORAL_TABLET | Freq: Three times a day (TID) | ORAL | Status: DC
  Administered 2023-07-25 – 2023-08-05 (×33): 15 mg via ORAL
  Filled 2023-07-25: qty 3
  Filled 2023-07-25: qty 1
  Filled 2023-07-25 (×3): qty 3
  Filled 2023-07-25: qty 1
  Filled 2023-07-25: qty 3
  Filled 2023-07-25: qty 1
  Filled 2023-07-25: qty 3
  Filled 2023-07-25: qty 1
  Filled 2023-07-25: qty 3
  Filled 2023-07-25: qty 1
  Filled 2023-07-25: qty 3
  Filled 2023-07-25: qty 1
  Filled 2023-07-25 (×2): qty 3
  Filled 2023-07-25: qty 1
  Filled 2023-07-25: qty 3
  Filled 2023-07-25: qty 1
  Filled 2023-07-25 (×4): qty 3
  Filled 2023-07-25 (×2): qty 1
  Filled 2023-07-25: qty 3
  Filled 2023-07-25 (×3): qty 1
  Filled 2023-07-25 (×2): qty 3
  Filled 2023-07-25: qty 1
  Filled 2023-07-25: qty 3

## 2023-07-25 MED ORDER — ALBUTEROL SULFATE (2.5 MG/3ML) 0.083% IN NEBU: 2.5000 mg | INHALATION_SOLUTION | RESPIRATORY_TRACT | Status: DC | PRN

## 2023-07-25 MED ORDER — MESALAMINE 1.2 G PO TBEC
2.4000 g | DELAYED_RELEASE_TABLET | Freq: Every day | ORAL | Status: DC
  Administered 2023-07-25 – 2023-08-05 (×12): 2.4 g via ORAL
  Filled 2023-07-25 (×12): qty 2

## 2023-07-25 MED ORDER — DICYCLOMINE HCL 10 MG PO CAPS
10.0000 mg | ORAL_CAPSULE | Freq: Two times a day (BID) | ORAL | Status: DC | PRN
  Administered 2023-07-25 – 2023-08-02 (×9): 10 mg via ORAL
  Filled 2023-07-25 (×10): qty 1

## 2023-07-25 MED ORDER — HYDROMORPHONE HCL 1 MG/ML IJ SOLN
0.5000 mg | INTRAMUSCULAR | Status: DC | PRN
  Administered 2023-07-25 – 2023-07-29 (×20): 0.5 mg via INTRAVENOUS
  Filled 2023-07-25: qty 1
  Filled 2023-07-25 (×21): qty 0.5

## 2023-07-25 MED ORDER — DULOXETINE HCL 30 MG PO CPEP
60.0000 mg | ORAL_CAPSULE | Freq: Two times a day (BID) | ORAL | Status: DC
  Administered 2023-07-25 – 2023-08-05 (×22): 60 mg via ORAL
  Filled 2023-07-25: qty 1
  Filled 2023-07-25: qty 2
  Filled 2023-07-25: qty 1
  Filled 2023-07-25: qty 2
  Filled 2023-07-25: qty 1
  Filled 2023-07-25: qty 2
  Filled 2023-07-25 (×2): qty 1
  Filled 2023-07-25: qty 2
  Filled 2023-07-25: qty 1
  Filled 2023-07-25: qty 2
  Filled 2023-07-25 (×3): qty 1
  Filled 2023-07-25 (×3): qty 2
  Filled 2023-07-25: qty 1
  Filled 2023-07-25 (×2): qty 2
  Filled 2023-07-25 (×2): qty 1

## 2023-07-25 MED ORDER — BUPROPION HCL ER (XL) 300 MG PO TB24
450.0000 mg | ORAL_TABLET | Freq: Every morning | ORAL | Status: DC
  Administered 2023-07-26 – 2023-08-05 (×11): 450 mg via ORAL
  Filled 2023-07-25 (×11): qty 1

## 2023-07-25 MED ORDER — PROPOFOL 10 MG/ML IV BOLUS
INTRAVENOUS | Status: DC | PRN
  Administered 2023-07-25 (×2): 30 mg via INTRAVENOUS
  Administered 2023-07-25: 100 mg via INTRAVENOUS

## 2023-07-25 MED ORDER — PROPOFOL 1000 MG/100ML IV EMUL
INTRAVENOUS | Status: AC
  Filled 2023-07-25: qty 100

## 2023-07-25 MED ORDER — IOHEXOL 350 MG/ML SOLN
100.0000 mL | Freq: Once | INTRAVENOUS | Status: AC | PRN
  Administered 2023-07-25: 100 mL via INTRAVENOUS

## 2023-07-25 MED ORDER — SUCRALFATE 1 G PO TABS
1.0000 g | ORAL_TABLET | Freq: Four times a day (QID) | ORAL | Status: DC
  Administered 2023-07-25: 1 g via ORAL
  Filled 2023-07-25 (×3): qty 1

## 2023-07-25 MED ORDER — ATORVASTATIN CALCIUM 40 MG PO TABS
40.0000 mg | ORAL_TABLET | Freq: Every day | ORAL | Status: DC
  Administered 2023-07-26 – 2023-08-05 (×11): 40 mg via ORAL
  Filled 2023-07-25 (×7): qty 1
  Filled 2023-07-25: qty 2
  Filled 2023-07-25 (×3): qty 1

## 2023-07-25 MED ORDER — FENTANYL CITRATE PF 50 MCG/ML IJ SOSY
PREFILLED_SYRINGE | INTRAMUSCULAR | Status: AC
  Administered 2023-07-25: 50 ug
  Filled 2023-07-25: qty 1

## 2023-07-25 MED ORDER — PANTOPRAZOLE SODIUM 40 MG IV SOLR
40.0000 mg | Freq: Two times a day (BID) | INTRAVENOUS | Status: DC
  Administered 2023-07-25 – 2023-07-31 (×12): 40 mg via INTRAVENOUS
  Filled 2023-07-25 (×13): qty 10

## 2023-07-25 MED ORDER — SUMATRIPTAN SUCCINATE 50 MG PO TABS
50.0000 mg | ORAL_TABLET | ORAL | Status: DC | PRN
  Administered 2023-07-30 – 2023-08-03 (×3): 50 mg via ORAL
  Filled 2023-07-25 (×7): qty 1

## 2023-07-25 MED ORDER — LIDOCAINE HCL (CARDIAC) PF 100 MG/5ML IV SOSY
PREFILLED_SYRINGE | INTRAVENOUS | Status: DC | PRN
  Administered 2023-07-25: 100 mg via INTRAVENOUS

## 2023-07-25 MED ORDER — ONDANSETRON HCL 4 MG/2ML IJ SOLN
4.0000 mg | Freq: Once | INTRAMUSCULAR | Status: AC
Start: 2023-07-25 — End: 2023-07-25
  Administered 2023-07-25: 4 mg via INTRAVENOUS
  Filled 2023-07-25: qty 2

## 2023-07-25 MED ORDER — METOCLOPRAMIDE HCL 5 MG/ML IJ SOLN: 5.0000 mg | Freq: Once | INTRAMUSCULAR | Status: DC

## 2023-07-25 MED ORDER — ATOGEPANT 30 MG PO TABS
30.0000 mg | ORAL_TABLET | Freq: Every day | ORAL | Status: DC
  Administered 2023-07-26 – 2023-08-05 (×11): 30 mg via ORAL
  Filled 2023-07-25 (×7): qty 1

## 2023-07-25 MED ORDER — AMISULPRIDE (ANTIEMETIC) 5 MG/2ML IV SOLN
INTRAVENOUS | Status: AC
  Filled 2023-07-25: qty 4

## 2023-07-25 NOTE — Plan of Care (Signed)
Discussed EGD findings briefly with Dr. Lorenso Quarry.  Spoke with Dr. Luisa Hart of general surgery, who will consult today.

## 2023-07-25 NOTE — Consult Note (Signed)
[facility] Gastroenterology Consult  Referring Provider: No ref. provider found Primary Care Physician:  Katherine Pacini, FNP Primary Gastroenterologist: [name]   Reason for Consultation: Hematemesis  SUBJECTIVE:   HPI: [name] is a 47 y.o. female with past medical history significant for ulcerative colitis diagnosed 2015 (on Lialda), history of bariatric surgery with revision and gastric bypass in 2021, iron deficiency. EGD 01/20/23 for upper GI bleeding (Dr. Marca Lutz) showed previous gastric bypass, gastric pouch with ulceration, anastomosis showed clean based gastric ulcers, multiple clean based gastric ulcers in jejunum. H.Pylori testing negative.   Presented to ER with chief complaint of hematemesis, large volume, beginning roughly 0100 on 07/25/23. She noted having melena beginning on 07/23/23. She has epigastric abdominal discomfort. She has had roughly 30 lb unintentional weight loss since April 2024. Her last episode of hematemesis was before presentation to hospital. She denied NSAID use. She has discontinued tobacco use. She denied anticoagulant use.   She follows with [facility]. Noted that she had EGD completed on 06/29/23 with findings of oozing ulcer that did not undergo hemostatic intervention. Plan was for patient to be evaluated by surgery if, at repeat EGD in 3 months, she continued to have ulcer with signs of recent bleeding.   Past Medical History:  Diagnosis Date   Anal fissure - posterior 10/16/2014    OCC  ISSUES   Anxiety    doesn't take anything   Asthma    has Albuterol inhaler as needed   Boil    on pubic area started septra 03-01-17 draining blood and pus   Bronchitis    Chronic headache disorder 07/22/2016   Clostridium difficile infection 04/20/2012   Colitis    Dehydration    Depression    Family history of adverse reaction to anesthesia    pt mom gets sick   GERD (gastroesophageal reflux disease)    takes Pantoprazole daily    Headache    Hiatal hernia    neuropathy - mild in arms and legs   History of blood transfusion    last transfusion was 04/04/2016=Benadryl was given d/t itching. States she always itches with transfusion.    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   Marginal ulcer 10/25/2017   Migraine    Motion sickness    Nausea    takes Zofran as needed   Nausea and vomiting    for 1 year   Obesity    Oligouria    Osteoarthritis    lower back, knees, wrists - no meds   Peripheral neuropathy 03/01/2019   Pneumonia 1997   Postoperative nausea and vomiting 01/21/2016   wants scopolamine patch   Right carpal tunnel syndrome 03/01/2019   SVD (spontaneous vaginal delivery)    x 4   Tingling    BOTH LOWER EXTREMETIES ALL THE TIME DUE TO RAPID WEIGHT LOSS   TOBACCO USER 10/02/2009   Qualifier: Diagnosis of  By: Katherine Fiscal MD, Evan     Transfusion history    last transfusion 6'16    Tremor 08/31/2018  UC (ulcerative colitis) (HCC)    supposed to be taking Lialda and Bentyl but has been off since gastric sleeve   Vertigo    doesn't take any meds   Past Surgical History:  Procedure Laterality Date   ABDOMINAL HYSTERECTOMY     PARTIAL   BIOPSY  01/20/2023   Procedure: BIOPSY;  Surgeon: Kerin Salen, MD;  Location: [facility];  Service: Gastroenterology;;   COLONOSCOPY  2007   for rectal bleeding; [facility]   COLONOSCOPY N/A 06/11/2014   Procedure: COLONOSCOPY;  Surgeon: Katherine Boop, MD;  Location: [facility];  Service: Endoscopy;  Laterality: N/A;   COLONOSCOPY WITH PROPOFOL N/A 03/02/2017   Procedure: COLONOSCOPY WITH PROPOFOL;  Surgeon: Katherine Kin, MD;  Location: [facility];  Service: General;  Laterality: N/A;   DILATION AND CURETTAGE OF UTERUS     ESOPHAGOGASTRODUODENOSCOPY (EGD) WITH PROPOFOL N/A 04/06/2016   Procedure: ESOPHAGOGASTRODUODENOSCOPY (EGD) WITH PROPOFOL;  Surgeon: Katherine Kin, MD;  Location: [facility];  Service: General;  Laterality: N/A;   ESOPHAGOGASTRODUODENOSCOPY (EGD) WITH PROPOFOL N/A 03/02/2017   Procedure: ESOPHAGOGASTRODUODENOSCOPY (EGD) WITH PROPOFOL;  Surgeon: Katherine Kin, MD;  Location: [facility];  Service: General;  Laterality: N/A;   ESOPHAGOGASTRODUODENOSCOPY (EGD) WITH PROPOFOL N/A 05/20/2017   Procedure: ESOPHAGOGASTRODUODENOSCOPY (EGD) WITH PROPOFOL ERAS PATHWAY;  Surgeon: Katherine Kin, MD;  Location: [facility];  Service: General;  Laterality: N/A;   ESOPHAGOGASTRODUODENOSCOPY (EGD) WITH PROPOFOL N/A 10/07/2017   Procedure: ESOPHAGOGASTRODUODENOSCOPY (EGD) WITH PROPOFOL;  Surgeon: Katherine Kin, MD;  Location: [facility];  Service: General;  Laterality: N/A;   ESOPHAGOGASTRODUODENOSCOPY (EGD) WITH PROPOFOL N/A 01/20/2023   Procedure: ESOPHAGOGASTRODUODENOSCOPY (EGD) WITH PROPOFOL;  Surgeon: Kerin Salen, MD;  [facility];  Service: Gastroenterology;  Laterality: N/A;   EXCISION OF SKIN TAG  06/08/2017   Procedure: EXCISION OF VULVAR SKIN TAGS X2;  Surgeon: Katherine Bores, MD;  Location: [facility];  Service: Gynecology;;   FOOT SURGERY Bilateral    x 2   GASTRIC ROUX-EN-Y N/A 12/14/2016   Procedure: LAPAROSCOPIC REVISION SLEEVE GASTRECTOMY TO  ROUX-Y-GASTRIC BY-PASS, UPPER ENDO;  Surgeon: Katherine Fellows, MD;  Location: [facility];  Service: General;  Laterality: N/A;   GASTROJEJUNOSTOMY N/A 05/11/2016   Procedure: LAPAROSCOPIC PLACEMENT  OF FEEDING  JEJUNOSTOMY TUBE;  Surgeon: Katherine Fellows, MD;  Location: [facility];  Service: General;  Laterality: N/A;   HEMORRHOIDECTOMY WITH HEMORRHOID BANDING     IR FLUORO GUIDE CV LINE RIGHT  04/06/2017   IR GENERIC HISTORICAL  05/19/2016   IR CM INJ ANY COLONIC TUBE  W/FLUORO 05/19/2016 Irish Lack, MD [facility]   IR PATIENT EVAL TECH 0-60 MINS  11/30/2017   IR REPLC DUODEN/JEJUNO TUBE PERCUT W/FLUORO  03/14/2018   IR REPLC DUODEN/JEJUNO TUBE PERCUT W/FLUORO  03/15/2018   IR US GUIDE VASC ACCESS RIGHT  04/06/2017   j tube removed march 2018     KNEE ARTHROSCOPY Left 09/06/2014   LAPAROSCOPIC GASTRIC SLEEVE RESECTION N/A 01/14/2016   Procedure: LAPAROSCOPIC GASTRIC SLEEVE RESECTION;  Surgeon: Katherine Fellows, MD;  [facility];  Service: General;  Laterality: N/A;   LAPAROSCOPIC REVISION OF GASTROJEJUNOSTOMY N/A 10/25/2017   Procedure: LAPAROSCOPIC REVISION OF GASTROJEJUNOSTOMY AND PARTIAL GASTRECTOMY, WITH PLACEMENT OF FEEDING GASTROSTOMY TUBE;  Surgeon: Katherine Fellows, MD;  [facility];  Service: General;  Laterality: N/A;   LAPAROSCOPIC TUBAL LIGATION  10/16/2011   Procedure: LAPAROSCOPIC TUBAL LIGATION;  Surgeon: Esmeralda Arthur, MD;  Location: [facility];  Service: Gynecology;  Laterality: Bilateral;   NOVASURE ABLATION  09/28/2010   mild persistent vaginal bleeding   right knee arthroscopy     05/07/2016   TOTAL KNEE ARTHROPLASTY Left 03/10/2018   Procedure: LEFT TOTAL KNEE ARTHROPLASTY;  Surgeon: Durene Romans, MD;  [facility]: [facility];  Service: Orthopedics;  Laterality: Left;  70 mins   TOTAL KNEE ARTHROPLASTY Right 01/23/2020   Procedure: TOTAL KNEE ARTHROPLASTY, BILATERAL TROCHANTERIC INJECTION;  Surgeon: Durene Romans, MD;  Location: [facility];  Service: Orthopedics;  Laterality: Right;  70 mins for Bilateral Troch injection 1 cc Depo and 2 CC Lidocaine   TUBAL LIGATION     VAGINAL HYSTERECTOMY Bilateral 06/08/2017   Procedure: HYSTERECTOMY VAGINAL W/ BILATERAL SALPINGECTOMY;  Surgeon: Katherine Bores, MD;  Location: [facility];  Service: Gynecology;  Laterality: Bilateral;   wisdom teeth extracted     Prior to Admission medications   Medication Sig Start Date End Date Taking? Authorizing Provider  albuterol (VENTOLIN HFA) 108 (90 Base) MCG/ACT  inhaler Inhale 2 puffs into the lungs every 6 (six) hours as needed for wheezing or shortness of breath.   Yes [provider]  Atogepant (QULIPTA) 30 MG TABS Take 1 tablet (30 mg total) by mouth daily. 05/05/23  Yes Glean Salvo, NP  atorvastatin (LIPITOR) 40 MG tablet Take 40 mg by mouth daily. 12/10/22  Yes [provider]  buPROPion (WELLBUTRIN XL) 150 MG 24 hr tablet Take 150 mg by mouth every morning. Take along with 300 mg=450 mg   Yes [provider]  buPROPion (WELLBUTRIN XL) 300 MG 24 hr tablet Take 300 mg by mouth every morning. Take along with 150 mg=450 mg 06/05/20  Yes [provider]  busPIRone (BUSPAR) 15 MG tablet Take 15 mg by mouth 3 (three) times daily.   Yes [provider]  Cholecalciferol (VITAMIN D3) 1.25 MG (50000 UT) CAPS Take 50,000 Units by mouth every 7 (seven) days.  04/03/19  Yes [provider]  clonazePAM (KLONOPIN) 1 MG tablet Take 1 mg by mouth 2 (two) times daily as needed for anxiety.   Yes [provider]  cyanocobalamin (,VITAMIN B-12,) 1000 MCG/ML injection Inject 1,000 mcg into the muscle 2 (two) times a week.  04/06/19  Yes [provider]  diclofenac Sodium (VOLTAREN) 1 % GEL Apply topically 4 (four) times daily.   Yes [provider]  dicyclomine (BENTYL) 10 MG capsule Take 10 mg by mouth 2 (two) times daily as needed for spasms. 12/17/20  Yes [provider]  DULoxetine (CYMBALTA) 60 MG capsule Take 1 capsule (60 mg total) by mouth 2 (two) times daily. 04/10/19  Yes Aldean Baker, NP  EPINEPHrine 0.3 mg/0.3 mL IJ SOAJ injection Inject 0.3 mg into the muscle as needed for anaphylaxis. 04/12/21  Yes Haskel Schroeder, PA-C  ferrous sulfate 325 (65 FE) MG tablet Take 1 tablet (325 mg total) by mouth daily with breakfast. 01/29/23  Yes Glade Lloyd, MD  fluticasone (FLONASE) 50 MCG/ACT nasal spray Place 2 sprays into both nostrils daily as needed for allergies or rhinitis.   Yes  [provider]  gabapentin (NEURONTIN) 600 MG tablet Take 600 mg by mouth 3 (three) times daily.   Yes [provider]  hydrocortisone (ANUSOL-HC) 2.5 % rectal cream Place 1 Application rectally daily as needed for hemorrhoids. 07/29/18  Yes [provider]  HYDROmorphone (DILAUDID) 8 MG tablet Take 0.5 tablets (4 mg total) by mouth every 3 (three) hours. 03/08/23  Yes   linaclotide (LINZESS) 290 MCG CAPS capsule Take 290  mcg by mouth daily before breakfast.   Yes [provider]  magnesium oxide (MAG-OX) 400 (240 Mg) MG tablet Take 1 tablet by mouth daily. 12/19/22  Yes [provider]  meclizine (ANTIVERT) 25 MG tablet Take 25 mg by mouth 3 (three) times daily. 04/15/22  Yes [provider]  mesalamine (LIALDA) 1.2 g EC tablet Take 2.4 g by mouth daily with breakfast.   Yes [provider]  mirtazapine (REMERON) 15 MG tablet Take 15 mg by mouth at bedtime as needed (sleep).   Yes [provider]  Multiple Vitamin (MULTIVITAMIN WITH MINERALS) TABS tablet Take 1 tablet by mouth daily.   Yes [provider]  NARCAN 4 MG/0.1ML LIQD nasal spray kit Place 1 spray into the nose daily as needed (overdose). 03/18/20  Yes [provider]  ondansetron (ZOFRAN) 8 MG tablet Take 8 mg by mouth every 8 (eight) hours as needed for nausea or vomiting.   Yes [provider]  oxybutynin (DITROPAN) 5 MG tablet Take 10 mg by mouth at bedtime.   Yes [provider]  pantoprazole (PROTONIX) 40 MG tablet TAKE 1 TABLET BY MOUTH TWICE A DAY BEFORE A MEAL 02/22/20  Yes Katherine Boop, MD  sucralfate (CARAFATE) 1 g tablet Take 1 tablet (1 g total) by mouth every 6 (six) hours. 01/21/23  Yes Arnetha Courser, MD  SUMAtriptan (IMITREX) 50 MG tablet Take 1 tablet (50 mg total) by mouth every 2 (two) hours as needed for migraine. May repeat in 2 hours if headache persists or recurs. 05/05/23  Yes Glean Salvo, NP  SYMPROIC 0.2 MG  TABS Take 1 tablet by mouth daily. 05/13/21  Yes [provider]  thiamine (VITAMIN B-1) 100 MG tablet Take 100 mg by mouth daily at 12 noon.    Yes [provider]  torsemide (DEMADEX) 10 MG tablet Take 10 mg by mouth daily. 11/10/21  Yes [provider]  valACYclovir (VALTREX) 1000 MG tablet Take 1,000 mg by mouth 2 (two) times daily as needed.   Yes [provider]  botulinum toxin Type A (BOTOX) 200 units injection INJECT 155 UNITS INTRAMUSCULARLY INTO HEAD AND NECK MUSCLES EVERY 3 MONTHS 11/23/22   Levert Feinstein, MD   Current Facility-Administered Medications  Medication Dose Route Frequency Provider Last Rate Last Admin   albuterol (PROVENTIL) (2.5 MG/3ML) 0.083% nebulizer solution 2.5 mg  2.5 mg Nebulization Q2H PRN Kirby Crigler, Mir M, MD       Atogepant TABS 30 mg  30 mg Oral Daily Kirby Crigler, Mir M, MD       atorvastatin (LIPITOR) tablet 40 mg  40 mg Oral Daily Kirby Crigler, Mir M, MD       buPROPion (WELLBUTRIN XL) 24 hr tablet 450 mg  450 mg Oral q morning Kirby Crigler, Mir M, MD       busPIRone (BUSPAR) tablet 15 mg  15 mg Oral TID Kirby Crigler, Mir M, MD       clonazePAM Scarlette Calico) tablet 1 mg  1 mg Oral BID PRN Kirby Crigler, Mir M, MD       dicyclomine (BENTYL) capsule 10 mg  10 mg Oral BID PRN Kirby Crigler, Mir M, MD       DULoxetine (CYMBALTA) DR capsule 60 mg  60 mg Oral BID Kirby Crigler, Mir M, MD       fluticasone (FLONASE) 50 MCG/ACT nasal spray 2 spray  2 spray Each Nare Daily PRN Kirby Crigler, Mir M, MD       HYDROmorphone (DILAUDID) tablet 4  mg  4 mg Oral Q3H PRN Kirby Crigler, Mir M, MD       linaclotide Karlene Einstein) capsule 290 mcg  290 mcg Oral QAC breakfast Kirby Crigler, Mir M, MD       mesalamine (LIALDA) EC tablet 2.4 g  2.4 g Oral Q breakfast Kirby Crigler, Mir M, MD       metoCLOPramide (REGLAN) injection 5 mg  5 mg Intravenous Once Liliane Shi H, DO       mirtazapine (REMERON) tablet 15 mg  15 mg Oral QHS PRN Kirby Crigler, Mir M, MD       ondansetron  [facility]) tablet 4 mg  4 mg Oral Q6H PRN Kirby Crigler, Mir M, MD       Or   ondansetron [facility]) injection 4 mg  4 mg Intravenous Q6H PRN Kirby Crigler, Mir M, MD       pantoprazole (PROTONIX) injection 40 mg  40 mg Intravenous Q12H Lataysha Vohra H, DO       sucralfate (CARAFATE) tablet 1 g  1 g Oral Q6H Ikramullah, Mir M, MD       SUMAtriptan (IMITREX) tablet 50 mg  50 mg Oral Q2H PRN Kirby Crigler, Mir M, MD       Current Outpatient Medications  Medication Sig Dispense Refill   albuterol (VENTOLIN HFA) 108 (90 Base) MCG/ACT inhaler Inhale 2 puffs into the lungs every 6 (six) hours as needed for wheezing or shortness of breath.     Atogepant (QULIPTA) 30 MG TABS Take 1 tablet (30 mg total) by mouth daily. 30 tablet 6   atorvastatin (LIPITOR) 40 MG tablet Take 40 mg by mouth daily.     buPROPion (WELLBUTRIN XL) 150 MG 24 hr tablet Take 150 mg by mouth every morning. Take along with 300 mg=450 mg     buPROPion (WELLBUTRIN XL) 300 MG 24 hr tablet Take 300 mg by mouth every morning. Take along with 150 mg=450 mg     busPIRone (BUSPAR) 15 MG tablet Take 15 mg by mouth 3 (three) times daily.     Cholecalciferol (VITAMIN D3) 1.25 MG (50000 UT) CAPS Take 50,000 Units by mouth every 7 (seven) days.      clonazePAM (KLONOPIN) 1 MG tablet Take 1 mg by mouth 2 (two) times daily as needed for anxiety.     cyanocobalamin (,VITAMIN B-12,) 1000 MCG/ML injection Inject 1,000 mcg into the muscle 2 (two) times a week.      diclofenac Sodium (VOLTAREN) 1 % GEL Apply topically 4 (four) times daily.     dicyclomine (BENTYL) 10 MG capsule Take 10 mg by mouth 2 (two) times daily as needed for spasms.     DULoxetine (CYMBALTA) 60 MG capsule Take 1 capsule (60 mg total) by mouth 2 (two) times daily. 60 capsule 0   EPINEPHrine 0.3 mg/0.3 mL IJ SOAJ injection Inject 0.3 mg into the muscle as needed for anaphylaxis. 1 each 0   ferrous sulfate 325 (65 FE) MG tablet Take 1 tablet (325 mg total) by mouth daily with breakfast.      fluticasone (FLONASE) 50 MCG/ACT nasal spray Place 2 sprays into both nostrils daily as needed for allergies or rhinitis.     gabapentin (NEURONTIN) 600 MG tablet Take 600 mg by mouth 3 (three) times daily.     hydrocortisone (ANUSOL-HC) 2.5 % rectal cream Place 1 Application rectally daily as needed for hemorrhoids.     HYDROmorphone (DILAUDID) 8 MG tablet Take 0.5 tablets (4 mg total) by mouth every 3 (three) hours. 120 tablet  0   linaclotide (LINZESS) 290 MCG CAPS capsule Take 290 mcg by mouth daily before breakfast.     magnesium oxide (MAG-OX) 400 (240 Mg) MG tablet Take 1 tablet by mouth daily.     meclizine (ANTIVERT) 25 MG tablet Take 25 mg by mouth 3 (three) times daily.     mesalamine (LIALDA) 1.2 g EC tablet Take 2.4 g by mouth daily with breakfast.     mirtazapine (REMERON) 15 MG tablet Take 15 mg by mouth at bedtime as needed (sleep).     Multiple Vitamin (MULTIVITAMIN WITH MINERALS) TABS tablet Take 1 tablet by mouth daily.     NARCAN 4 MG/0.1ML LIQD nasal spray kit Place 1 spray into the nose daily as needed (overdose).     ondansetron (ZOFRAN) 8 MG tablet Take 8 mg by mouth every 8 (eight) hours as needed for nausea or vomiting.     oxybutynin (DITROPAN) 5 MG tablet Take 10 mg by mouth at bedtime.     pantoprazole (PROTONIX) 40 MG tablet TAKE 1 TABLET BY MOUTH TWICE A DAY BEFORE A MEAL 180 tablet 1   sucralfate (CARAFATE) 1 g tablet Take 1 tablet (1 g total) by mouth every 6 (six) hours. 120 tablet 1   SUMAtriptan (IMITREX) 50 MG tablet Take 1 tablet (50 mg total) by mouth every 2 (two) hours as needed for migraine. May repeat in 2 hours if headache persists or recurs. 10 tablet 6   SYMPROIC 0.2 MG TABS Take 1 tablet by mouth daily.     thiamine (VITAMIN B-1) 100 MG tablet Take 100 mg by mouth daily at 12 noon.      torsemide (DEMADEX) 10 MG tablet Take 10 mg by mouth daily.     valACYclovir (VALTREX) 1000 MG tablet Take 1,000 mg by mouth 2 (two) times daily as needed.      botulinum toxin Type A (BOTOX) 200 units injection INJECT 155 UNITS INTRAMUSCULARLY INTO HEAD AND NECK MUSCLES EVERY 3 MONTHS 1 each 3   Allergies as of 07/25/2023 - Review Complete 07/25/2023  Allergen Reaction Noted   Ciprofloxacin Itching and Rash 04/20/2012   Coconut (cocos nucifera) Anaphylaxis and Itching 06/14/2017   Coconut oil Anaphylaxis, Itching, and Other (See Comments) 06/14/2017   Morphine and codeine Anaphylaxis, Hives, and Other (See Comments) 09/02/2011   Oxycodone Anaphylaxis 09/02/2011   Oxycodone-acetaminophen Anaphylaxis 10/08/2021   Doxycycline Nausea And Vomiting and Other (See Comments) 09/02/2011   Penicillamine Hives and Other (See Comments) 08/02/2018   Trazodone and nefazodone Rash 04/10/2019   Vistaril [hydroxyzine hcl] Hives 04/10/2019   Silicone  06/11/2022   Penicillins Rash 09/02/2011   Tape Rash 01/31/2017   Family History  Problem Relation Age of Onset   Hypertension Mother    Diabetes Mother    Sarcoidosis Mother        lungs and skin   Asthma Mother    Hypertension Father    Diabetes Father    Asthma Son    Tics Son    Asthma Sister    Cancer Sister        possible pancreatic cancer   Adrenal disorder Sister        Tumor    Asthma Brother    Asthma Daughter        died age 38.5   Cancer Daughter 4       brain; died age 38.5   Asthma Son    Cancer Paternal Aunt        brain,  colon, lung and esophagus; unsure of primary    Breast cancer Paternal Aunt    Social History   Socioeconomic History   Marital status: Single    Spouse name: Not on file   Number of children: 3   Years of education: Some college   Highest education level: Not on file  Occupational History   Occupation: English as a second language teacher    Comment: Currently out on disability d/t surg  Tobacco Use   Smoking status: Former    Current packs/day: 0.00    Average packs/day: 0.5 packs/day for 20.0 years (10.0 ttl pk-yrs)    Types: Cigarettes    Start date: 03/17/1994     Quit date: 03/17/2014    Years since quitting: 9.3   Smokeless tobacco: Never  Vaping Use   Vaping status: Never Used  Substance and Sexual Activity   Alcohol use: Yes    Alcohol/week: 0.0 standard drinks of alcohol   Drug use: No   Sexual activity: Not Currently    Birth control/protection: Surgical, Abstinence  Other Topics Concern   Not on file  Social History Narrative   Lives with 2 sons   Right Handed   Drinks caffeine2-3 cups daily   Social Determinants of Health   Financial Resource Strain: Low Risk  (04/08/2019)   Overall Financial Resource Strain (CARDIA)    Difficulty of Paying Living Expenses: Not hard at all  Food Insecurity: No Food Insecurity (01/26/2023)   Hunger Vital Sign    Worried About Running Out of Food in the Last Year: Never true    Ran Out of Food in the Last Year: Never true  Transportation Needs: No Transportation Needs (01/26/2023)   PRAPARE - Administrator, Civil Service (Medical): No    Lutz of Transportation (Non-Medical): No  Physical Activity: Inactive (04/08/2019)   Exercise Vital Sign    Days of Exercise per Week: 0 days    Minutes of Exercise per Session: 0 min  Stress: Stress Concern Present (04/08/2019)   [employer] of Occupational Health - Occupational Stress Questionnaire    Feeling of Stress : To some extent  Social Connections: Unknown (01/29/2022)   [facility] from [facility]   Social Network    Social Network: Not on file  Intimate Partner Violence: Not At Risk (01/26/2023)   Humiliation, Afraid, Rape, and Kick questionnaire    Fear of Current or Ex-Partner: No    Emotionally Abused: No    Physically Abused: No    Sexually Abused: No   Review of Systems:  Review of Systems  Constitutional:  Positive for malaise/fatigue and weight loss.  Respiratory:  Negative for shortness of breath.   Cardiovascular:  Negative for chest pain.  Gastrointestinal:  Positive for abdominal pain, melena, nausea and  vomiting.    OBJECTIVE:   Temp:  [98.9 F (37.2 C)-99.1 F (37.3 C)] 98.9 F (37.2 C) (10/27 0646) Pulse Rate:  [74-106] 79 (10/27 0815) Resp:  [16-18] 16 (10/27 0646) BP: (107-121)/(72-84) 112/84 (10/27 0815) SpO2:  [98 %-100 %] 100 % (10/27 0815) Weight:  [61.2 kg] 61.2 kg (10/27 0303)   Physical Exam Constitutional:      General: She is not in acute distress.    Appearance: She is not ill-appearing, toxic-appearing or diaphoretic.  Cardiovascular:     Rate and Rhythm: Normal rate and regular rhythm.  Pulmonary:     Effort: No respiratory distress.     Breath sounds: Normal breath sounds.  Abdominal:  General: Bowel sounds are normal. There is no distension.     Palpations: Abdomen is soft.     Tenderness: There is abdominal tenderness. There is no guarding.  Musculoskeletal:     Right lower leg: No edema.     Left lower leg: No edema.  Skin:    General: Skin is warm and dry.  Neurological:     Mental Status: She is alert.     Labs: Recent Labs    07/25/23 0315 07/25/23 0840  WBC 10.9*  --   HGB 12.1 9.8*  HCT 38.6 31.0*  PLT 405*  --    BMET Recent Labs    07/25/23 0315  NA 141  K 3.6  CL 106  CO2 27  GLUCOSE 118*  BUN 25*  CREATININE 0.65  CALCIUM 9.4   LFT Recent Labs    07/25/23 0315  PROT 7.2  ALBUMIN 3.6  AST 15  ALT 17  ALKPHOS 67  BILITOT 0.6   PT/INR No results for input(s): "LABPROT", "INR" in the last 72 hours.  Diagnostic imaging: CT ANGIO ABDOMEN PELVIS  W & WO CONTRAST  Result Date: 07/25/2023 CLINICAL DATA:  Lower GI bleed. Hematemesis with bright red clots and some blood in stool. History of ulcers. EXAM: CTA ABDOMEN AND PELVIS WITHOUT AND WITH CONTRAST TECHNIQUE: Multidetector CT imaging of the abdomen and pelvis was performed using the standard protocol during bolus administration of intravenous contrast. Multiplanar reconstructed images and MIPs were obtained and reviewed to evaluate the vascular anatomy. RADIATION  DOSE REDUCTION: This exam was performed according to the departmental dose-optimization program which includes automated exposure control, adjustment of the mA and/or kV according to patient size and/or use of iterative reconstruction technique. CONTRAST:  OMNIPAQUE IOHEXOL 350 MG/ML SOLN COMPARISON:  01/25/2023. FINDINGS: VASCULAR Aorta: Normal caliber aorta without aneurysm, dissection, vasculitis or significant stenosis. Celiac: Patent without evidence of aneurysm, dissection, vasculitis or significant stenosis. Left hepatic artery originates from the aorta. SMA: Patent without evidence of aneurysm, dissection, vasculitis or significant stenosis. Right hepatic artery originates on the SMA. Renals: Both renal arteries are patent without evidence of aneurysm, dissection, vasculitis, fibromuscular dysplasia or significant stenosis. IMA: Patent without evidence of aneurysm, dissection, vasculitis or significant stenosis. Inflow: Patent without evidence of aneurysm, dissection, vasculitis or significant stenosis. Proximal Outflow: Bilateral common femoral and visualized portions of the superficial and profunda femoral arteries are patent without evidence of aneurysm, dissection, vasculitis or significant stenosis. Veins: No obvious venous abnormality within the limitations of this arterial phase study. Review of the MIP images confirms the above findings. NON-VASCULAR Lower chest: The heart is normal in size and there is a small pericardial effusion. The lung bases are clear. Hepatobiliary: No focal liver abnormality is seen. No gallstones, gallbladder wall thickening, or biliary dilatation. Pancreas: Unremarkable. No pancreatic ductal dilatation or surrounding inflammatory changes. Spleen: Normal size. A 2.0 cm hypodensity is noted in the spleen, likely cyst. Adrenals/Urinary Tract: The adrenal glands are within normal limits. No renal calculus or hydronephrosis bilaterally. The bladder is unremarkable.  Stomach/Bowel: There is a small hiatal hernia with thickening and edema of the walls of the distal esophagus. Gastric bypass surgical changes are present. Appendix appears normal. There is bowel wall thickening with surrounding edema involving extending from the gastrojejunostomy anastomosis. No bowel obstruction, free air, or pneumatosis. No acute hemorrhage is seen. Lymphatic: No significant vascular findings are present. No enlarged abdominal or pelvic lymph nodes. Reproductive: Status post hysterectomy. No adnexal masses. Other: No abdominopelvic  ascites. Musculoskeletal: A left sacral neurostimulator device is noted. 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 Degenerative changes are noted in the thoracolumbar spine. No acute osseous abnormality. IMPRESSION: VASCULAR No evidence of acute or active hemorrhage. NON-VASCULAR 1. Status post gastric bypass with bowel wall thickening and edema at the gastrojejunostomy anastomosis, which may be infectious or inflammatory. 2. Small hiatal hernia with mild thickening of the walls of the distal esophagus with associated edema, suggesting esophagitis. Electronically Signed   By: Thornell Sartorius M.D.   On: 07/25/2023 04:39    IMPRESSION: Hematemesis Blood loss anemia History anastomotic ulceration, previous bariatric surgery Epigastric pain  History ulcerative colitis   PLAN: -Recommend EGD to evaluate hematemesis, discussed procedure with patient including benefits, alternatives and risks of bleeding/infection/perforation/anesthesia, she verbalized understanding and elected to proceed -IV PPI Q12Hr -Reglan 5 mg IV once  -Trend H/H, transfuse for Hgb < 7  -NPO  -Further recommendations to follow pending endoscopy    LOS: 0 days   Liliane Shi, [facility]

## 2023-07-25 NOTE — Anesthesia Preprocedure Evaluation (Addendum)
Anesthesia Evaluation  Patient identified by MRN, date of birth, ID band Patient awake    Reviewed: Allergy & Precautions, NPO status , Patient's Chart, lab work & pertinent test results  History of Anesthesia Complications (+) PONV and history of anesthetic complications  Airway Mallampati: II  TM Distance: >3 FB Neck ROM: Full    Dental  (+) Teeth Intact, Dental Advisory Given, Upper Dentures, Chipped,    Pulmonary asthma , former smoker   Pulmonary exam normal breath sounds clear to auscultation       Cardiovascular negative cardio ROS Normal cardiovascular exam Rhythm:Regular Rate:Normal     Neuro/Psych  PSYCHIATRIC DISORDERS Anxiety Depression     Neuromuscular disease    GI/Hepatic Neg liver ROS, hiatal hernia, PUD,GERD  ,,UC S/p gastric sleeve   Endo/Other  negative endocrine ROS    Renal/GU negative Renal ROS     Musculoskeletal  (+) Arthritis ,    Abdominal   Peds  Hematology  (+) Blood dyscrasia, anemia   Anesthesia Other Findings Day of surgery medications reviewed with the patient.  Reproductive/Obstetrics                             Anesthesia Physical Anesthesia Plan  ASA: 3 and emergent  Anesthesia Plan: MAC   Post-op Pain Management: Minimal or no pain anticipated   Induction: Intravenous  PONV Risk Score and Plan: 3 and TIVA and Treatment may vary due to age or medical condition  Airway Management Planned: Natural Airway and Simple Face Mask  Additional Equipment:   Intra-op Plan:   Post-operative Plan:   Informed Consent: I have reviewed the patients History and Physical, chart, labs and discussed the procedure including the risks, benefits and alternatives for the proposed anesthesia with the patient or authorized representative who has indicated his/her understanding and acceptance.     Dental advisory given  Plan Discussed with: CRNA  Anesthesia  Plan Comments:        Anesthesia Quick Evaluation

## 2023-07-25 NOTE — H&P (View-Only) (Signed)
[facility] Gastroenterology Consult  Referring Provider: No ref. provider found Primary Care Physician:  Felix Pacini, FNP Primary Gastroenterologist: [facility]   Reason for Consultation: Hematemesis  SUBJECTIVE:   HPI: Lutz Katherine is a 47 y.o. female with past medical history significant for ulcerative colitis diagnosed 2015 (on Lialda), history of bariatric surgery with revision and gastric bypass in 2021, iron deficiency. EGD 01/20/23 for upper GI bleeding (Dr. Marca Ancona) showed previous gastric bypass, gastric pouch with ulceration, anastomosis showed clean based gastric ulcers, multiple clean based gastric ulcers in jejunum. H.Pylori testing negative.   Presented to ER with chief complaint of hematemesis, large volume, beginning roughly 0100 on 07/25/23. She noted having melena beginning on 07/23/23. She has epigastric abdominal discomfort. She has had roughly 30 lb unintentional weight loss since April 2024. Her last episode of hematemesis was before presentation to hospital. She denied NSAID use. She has discontinued tobacco use. She denied anticoagulant use.   She follows with [facility]. Noted that she had EGD completed on 06/29/23 with findings of oozing ulcer that did not undergo hemostatic intervention. Plan was for patient to be evaluated by surgery if, at repeat EGD in 3 months, she continued to have ulcer with signs of recent bleeding.   Past Medical History:  Diagnosis Date   Anal fissure - posterior 10/16/2014    OCC  ISSUES   Anxiety    doesn't take anything   Asthma    has Albuterol inhaler as needed   Boil    on pubic area started septra 03-01-17 draining blood and pus   Bronchitis    Chronic headache disorder 07/22/2016   Clostridium difficile infection 04/20/2012   Colitis    Dehydration    Depression    Family history of adverse reaction to anesthesia    pt mom gets sick   GERD (gastroesophageal reflux disease)    takes Pantoprazole daily    Headache    Hiatal hernia    neuropathy - mild in arms and legs   History of blood transfusion    last transfusion was 04/04/2016=Benadryl was given d/t itching. States she always itches with transfusion.    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   Marginal ulcer 10/25/2017   Migraine    Motion sickness    Nausea    takes Zofran as needed   Nausea and vomiting    for 1 year   Obesity    Oligouria    Osteoarthritis    lower back, knees, wrists - no meds   Peripheral neuropathy 03/01/2019   Pneumonia 1997   Postoperative nausea and vomiting 01/21/2016   wants scopolamine patch   Right carpal tunnel syndrome 03/01/2019   SVD (spontaneous vaginal delivery)    x 4   Tingling    BOTH LOWER EXTREMETIES ALL THE TIME DUE TO RAPID WEIGHT LOSS   TOBACCO USER 10/02/2009   Qualifier: Diagnosis of  By: Carolyne Fiscal MD, Evan     Transfusion history    last transfusion 6'16    Tremor 08/31/2018  UC (ulcerative colitis) (HCC)    supposed to be taking Lialda and Bentyl but has been off since gastric sleeve   Vertigo    doesn't take any meds   Past Surgical History:  Procedure Laterality Date   ABDOMINAL HYSTERECTOMY     PARTIAL   BIOPSY  01/20/2023   Procedure: BIOPSY;  Surgeon: Kerin Salen, MD;  Location: [facility];  Service: Gastroenterology;;   COLONOSCOPY  2007   for rectal bleeding; [facility]   COLONOSCOPY N/A 06/11/2014   Procedure: COLONOSCOPY;  Surgeon: Iva Boop, MD;  Location: [facility];  Service: Endoscopy;  Laterality: N/A;   COLONOSCOPY WITH PROPOFOL N/A 03/02/2017   Procedure: COLONOSCOPY WITH PROPOFOL;  Surgeon: Ovidio Kin, MD;  Location: [facility];  Service: General;  Laterality: N/A;   DILATION AND CURETTAGE OF UTERUS     ESOPHAGOGASTRODUODENOSCOPY (EGD) WITH PROPOFOL N/A 04/06/2016   Procedure: ESOPHAGOGASTRODUODENOSCOPY (EGD) WITH PROPOFOL;  Surgeon: Ovidio Kin, MD;  Location: [facility];  Service: General;  Laterality: N/A;   ESOPHAGOGASTRODUODENOSCOPY (EGD) WITH PROPOFOL N/A 03/02/2017   Procedure: ESOPHAGOGASTRODUODENOSCOPY (EGD) WITH PROPOFOL;  Surgeon: Ovidio Kin, MD;  Location: [facility];  Service: General;  Laterality: N/A;   ESOPHAGOGASTRODUODENOSCOPY (EGD) WITH PROPOFOL N/A 05/20/2017   Procedure: ESOPHAGOGASTRODUODENOSCOPY (EGD) WITH PROPOFOL ERAS PATHWAY;  Surgeon: Ovidio Kin, MD;  Location: [facility];  Service: General;  Laterality: N/A;   ESOPHAGOGASTRODUODENOSCOPY (EGD) WITH PROPOFOL N/A 10/07/2017   Procedure: ESOPHAGOGASTRODUODENOSCOPY (EGD) WITH PROPOFOL;  Surgeon: Ovidio Kin, MD;  Location: [facility];  Service: General;  Laterality: N/A;   ESOPHAGOGASTRODUODENOSCOPY (EGD) WITH PROPOFOL N/A 01/20/2023   Procedure: ESOPHAGOGASTRODUODENOSCOPY (EGD) WITH PROPOFOL;  Surgeon: Kerin Salen, MD;  [facility]: [facility];  Service: Gastroenterology;  Laterality: N/A;   EXCISION OF SKIN TAG  06/08/2017   Procedure: EXCISION OF VULVAR SKIN TAGS X2;  Surgeon: Reva Bores, MD;  Location: [facility];  Service: Gynecology;;   FOOT SURGERY Bilateral    x 2   GASTRIC ROUX-EN-Y N/A 12/14/2016   Procedure: LAPAROSCOPIC REVISION SLEEVE GASTRECTOMY TO  ROUX-Y-GASTRIC BY-PASS, UPPER ENDO;  Surgeon: Glenna Fellows, MD;  Location: [facility];  Service: General;  Laterality: N/A;   GASTROJEJUNOSTOMY N/A 05/11/2016   Procedure: LAPAROSCOPIC PLACEMENT  OF FEEDING  JEJUNOSTOMY TUBE;  Surgeon: Glenna Fellows, MD;  Location: [facility];  Service: General;  Laterality: N/A;   HEMORRHOIDECTOMY WITH HEMORRHOID BANDING     IR FLUORO GUIDE CV LINE RIGHT  04/06/2017   IR GENERIC HISTORICAL  05/19/2016   IR CM INJ ANY COLONIC TUBE  W/FLUORO 05/19/2016 Irish Lack, MD [facility]   IR PATIENT EVAL TECH 0-60 MINS  11/30/2017   IR REPLC DUODEN/JEJUNO TUBE PERCUT W/FLUORO  03/14/2018   IR REPLC DUODEN/JEJUNO TUBE PERCUT W/FLUORO  03/15/2018   IR US GUIDE VASC ACCESS RIGHT  04/06/2017   j tube removed march 2018     KNEE ARTHROSCOPY Left 09/06/2014   LAPAROSCOPIC GASTRIC SLEEVE RESECTION N/A 01/14/2016   Procedure: LAPAROSCOPIC GASTRIC SLEEVE RESECTION;  Surgeon: Glenna Fellows, MD;  [facility]: [facility];  Service: General;  Laterality: N/A;   LAPAROSCOPIC REVISION OF GASTROJEJUNOSTOMY N/A 10/25/2017   Procedure: LAPAROSCOPIC REVISION OF GASTROJEJUNOSTOMY AND PARTIAL GASTRECTOMY, WITH PLACEMENT OF FEEDING GASTROSTOMY TUBE;  Surgeon: Glenna Fellows, MD;  [facility]: [facility];  Service: General;  Laterality: N/A;   LAPAROSCOPIC TUBAL LIGATION  10/16/2011   Procedure: LAPAROSCOPIC TUBAL LIGATION;  Surgeon: Esmeralda Arthur, MD;  Location: [facility];  Service: Gynecology;  Laterality: Bilateral;   NOVASURE ABLATION  09/28/2010   mild persistent vaginal bleeding   right knee arthroscopy     05/07/2016   TOTAL KNEE ARTHROPLASTY Left 03/10/2018   Procedure: LEFT TOTAL KNEE ARTHROPLASTY;  Surgeon: Durene Romans, MD;  Location: [facility];  Service: Orthopedics;  Laterality: Left;  70 mins   TOTAL KNEE ARTHROPLASTY Right 01/23/2020   Procedure: TOTAL KNEE ARTHROPLASTY, BILATERAL TROCHANTERIC INJECTION;  Surgeon: Durene Romans, MD;  Location: [facility];  Service: Orthopedics;  Laterality: Right;  70 mins for Bilateral Troch injection 1 cc Depo and 2 CC Lidocaine   TUBAL LIGATION     VAGINAL HYSTERECTOMY Bilateral 06/08/2017   Procedure: HYSTERECTOMY VAGINAL W/ BILATERAL SALPINGECTOMY;  Surgeon: Reva Bores, MD;  Location: [facility];  Service: Gynecology;  Laterality: Bilateral;   wisdom teeth extracted     Prior to Admission medications   Medication Sig Start Date End Date Taking? Authorizing Provider  albuterol (VENTOLIN HFA) 108 (90 Base) MCG/ACT  inhaler Inhale 2 puffs into the lungs every 6 (six) hours as needed for wheezing or shortness of breath.   Yes [provider]  Atogepant (QULIPTA) 30 MG TABS Take 1 tablet (30 mg total) by mouth daily. 05/05/23  Yes Glean Salvo, NP  atorvastatin (LIPITOR) 40 MG tablet Take 40 mg by mouth daily. 12/10/22  Yes [provider]  buPROPion (WELLBUTRIN XL) 150 MG 24 hr tablet Take 150 mg by mouth every morning. Take along with 300 mg=450 mg   Yes [provider]  buPROPion (WELLBUTRIN XL) 300 MG 24 hr tablet Take 300 mg by mouth every morning. Take along with 150 mg=450 mg 06/05/20  Yes [provider]  busPIRone (BUSPAR) 15 MG tablet Take 15 mg by mouth 3 (three) times daily.   Yes [provider]  Cholecalciferol (VITAMIN D3) 1.25 MG (50000 UT) CAPS Take 50,000 Units by mouth every 7 (seven) days.  04/03/19  Yes [provider]  clonazePAM (KLONOPIN) 1 MG tablet Take 1 mg by mouth 2 (two) times daily as needed for anxiety.   Yes [provider]  cyanocobalamin (,VITAMIN B-12,) 1000 MCG/ML injection Inject 1,000 mcg into the muscle 2 (two) times a week.  04/06/19  Yes [provider]  diclofenac Sodium (VOLTAREN) 1 % GEL Apply topically 4 (four) times daily.   Yes [provider]  dicyclomine (BENTYL) 10 MG capsule Take 10 mg by mouth 2 (two) times daily as needed for spasms. 12/17/20  Yes [provider]  DULoxetine (CYMBALTA) 60 MG capsule Take 1 capsule (60 mg total) by mouth 2 (two) times daily. 04/10/19  Yes Aldean Baker, NP  EPINEPHrine 0.3 mg/0.3 mL IJ SOAJ injection Inject 0.3 mg into the muscle as needed for anaphylaxis. 04/12/21  Yes Haskel Schroeder, PA-C  ferrous sulfate 325 (65 FE) MG tablet Take 1 tablet (325 mg total) by mouth daily with breakfast. 01/29/23  Yes Glade Lloyd, MD  fluticasone (FLONASE) 50 MCG/ACT nasal spray Place 2 sprays into both nostrils daily as needed for allergies or rhinitis.   Yes  [provider]  gabapentin (NEURONTIN) 600 MG tablet Take 600 mg by mouth 3 (three) times daily.   Yes [provider]  hydrocortisone (ANUSOL-HC) 2.5 % rectal cream Place 1 Application rectally daily as needed for hemorrhoids. 07/29/18  Yes [provider]  HYDROmorphone (DILAUDID) 8 MG tablet Take 0.5 tablets (4 mg total) by mouth every 3 (three) hours. 03/08/23  Yes   linaclotide (LINZESS) 290 MCG CAPS capsule Take 290  mcg by mouth daily before breakfast.   Yes [provider]  magnesium oxide (MAG-OX) 400 (240 Mg) MG tablet Take 1 tablet by mouth daily. 12/19/22  Yes [provider]  meclizine (ANTIVERT) 25 MG tablet Take 25 mg by mouth 3 (three) times daily. 04/15/22  Yes [provider]  mesalamine (LIALDA) 1.2 g EC tablet Take 2.4 g by mouth daily with breakfast.   Yes [provider]  mirtazapine (REMERON) 15 MG tablet Take 15 mg by mouth at bedtime as needed (sleep).   Yes [provider]  Multiple Vitamin (MULTIVITAMIN WITH MINERALS) TABS tablet Take 1 tablet by mouth daily.   Yes [provider]  NARCAN 4 MG/0.1ML LIQD nasal spray kit Place 1 spray into the nose daily as needed (overdose). 03/18/20  Yes [provider]  ondansetron (ZOFRAN) 8 MG tablet Take 8 mg by mouth every 8 (eight) hours as needed for nausea or vomiting.   Yes [provider]  oxybutynin (DITROPAN) 5 MG tablet Take 10 mg by mouth at bedtime.   Yes [provider]  pantoprazole (PROTONIX) 40 MG tablet TAKE 1 TABLET BY MOUTH TWICE A DAY BEFORE A MEAL 02/22/20  Yes Iva Boop, MD  sucralfate (CARAFATE) 1 g tablet Take 1 tablet (1 g total) by mouth every 6 (six) hours. 01/21/23  Yes Arnetha Courser, MD  SUMAtriptan (IMITREX) 50 MG tablet Take 1 tablet (50 mg total) by mouth every 2 (two) hours as needed for migraine. May repeat in 2 hours if headache persists or recurs. 05/05/23  Yes Glean Salvo, NP  SYMPROIC 0.2 MG  TABS Take 1 tablet by mouth daily. 05/13/21  Yes [provider]  thiamine (VITAMIN B-1) 100 MG tablet Take 100 mg by mouth daily at 12 noon.    Yes [provider]  torsemide (DEMADEX) 10 MG tablet Take 10 mg by mouth daily. 11/10/21  Yes [provider]  valACYclovir (VALTREX) 1000 MG tablet Take 1,000 mg by mouth 2 (two) times daily as needed.   Yes [provider]  botulinum toxin Type A (BOTOX) 200 units injection INJECT 155 UNITS INTRAMUSCULARLY INTO HEAD AND NECK MUSCLES EVERY 3 MONTHS 11/23/22   Levert Feinstein, MD   Current Facility-Administered Medications  Medication Dose Route Frequency Provider Last Rate Last Admin   albuterol (PROVENTIL) (2.5 MG/3ML) 0.083% nebulizer solution 2.5 mg  2.5 mg Nebulization Q2H PRN Kirby Crigler, Mir M, MD       Atogepant TABS 30 mg  30 mg Oral Daily Kirby Crigler, Mir M, MD       atorvastatin (LIPITOR) tablet 40 mg  40 mg Oral Daily Kirby Crigler, Mir M, MD       buPROPion (WELLBUTRIN XL) 24 hr tablet 450 mg  450 mg Oral q morning Kirby Crigler, Mir M, MD       busPIRone (BUSPAR) tablet 15 mg  15 mg Oral TID Kirby Crigler, Mir M, MD       clonazePAM Scarlette Calico) tablet 1 mg  1 mg Oral BID PRN Kirby Crigler, Mir M, MD       dicyclomine (BENTYL) capsule 10 mg  10 mg Oral BID PRN Kirby Crigler, Mir M, MD       DULoxetine (CYMBALTA) DR capsule 60 mg  60 mg Oral BID Kirby Crigler, Mir M, MD       fluticasone (FLONASE) 50 MCG/ACT nasal spray 2 spray  2 spray Each Nare Daily PRN Kirby Crigler, Mir M, MD       HYDROmorphone (DILAUDID) tablet 4  mg  4 mg Oral Q3H PRN Kirby Crigler, Mir M, MD       linaclotide Karlene Einstein) capsule 290 mcg  290 mcg Oral QAC breakfast Kirby Crigler, Mir M, MD       mesalamine (LIALDA) EC tablet 2.4 g  2.4 g Oral Q breakfast Kirby Crigler, Mir M, MD       metoCLOPramide (REGLAN) injection 5 mg  5 mg Intravenous Once Liliane Shi H, DO       mirtazapine (REMERON) tablet 15 mg  15 mg Oral QHS PRN Kirby Crigler, Mir M, MD       ondansetron  [facility]) tablet 4 mg  4 mg Oral Q6H PRN Kirby Crigler, Mir M, MD       Or   ondansetron [facility]) injection 4 mg  4 mg Intravenous Q6H PRN Kirby Crigler, Mir M, MD       pantoprazole (PROTONIX) injection 40 mg  40 mg Intravenous Q12H Lataysha Vohra H, DO       sucralfate (CARAFATE) tablet 1 g  1 g Oral Q6H Ikramullah, Mir M, MD       SUMAtriptan (IMITREX) tablet 50 mg  50 mg Oral Q2H PRN Kirby Crigler, Mir M, MD       Current Outpatient Medications  Medication Sig Dispense Refill   albuterol (VENTOLIN HFA) 108 (90 Base) MCG/ACT inhaler Inhale 2 puffs into the lungs every 6 (six) hours as needed for wheezing or shortness of breath.     Atogepant (QULIPTA) 30 MG TABS Take 1 tablet (30 mg total) by mouth daily. 30 tablet 6   atorvastatin (LIPITOR) 40 MG tablet Take 40 mg by mouth daily.     buPROPion (WELLBUTRIN XL) 150 MG 24 hr tablet Take 150 mg by mouth every morning. Take along with 300 mg=450 mg     buPROPion (WELLBUTRIN XL) 300 MG 24 hr tablet Take 300 mg by mouth every morning. Take along with 150 mg=450 mg     busPIRone (BUSPAR) 15 MG tablet Take 15 mg by mouth 3 (three) times daily.     Cholecalciferol (VITAMIN D3) 1.25 MG (50000 UT) CAPS Take 50,000 Units by mouth every 7 (seven) days.      clonazePAM (KLONOPIN) 1 MG tablet Take 1 mg by mouth 2 (two) times daily as needed for anxiety.     cyanocobalamin (,VITAMIN B-12,) 1000 MCG/ML injection Inject 1,000 mcg into the muscle 2 (two) times a week.      diclofenac Sodium (VOLTAREN) 1 % GEL Apply topically 4 (four) times daily.     dicyclomine (BENTYL) 10 MG capsule Take 10 mg by mouth 2 (two) times daily as needed for spasms.     DULoxetine (CYMBALTA) 60 MG capsule Take 1 capsule (60 mg total) by mouth 2 (two) times daily. 60 capsule 0   EPINEPHrine 0.3 mg/0.3 mL IJ SOAJ injection Inject 0.3 mg into the muscle as needed for anaphylaxis. 1 each 0   ferrous sulfate 325 (65 FE) MG tablet Take 1 tablet (325 mg total) by mouth daily with breakfast.      fluticasone (FLONASE) 50 MCG/ACT nasal spray Place 2 sprays into both nostrils daily as needed for allergies or rhinitis.     gabapentin (NEURONTIN) 600 MG tablet Take 600 mg by mouth 3 (three) times daily.     hydrocortisone (ANUSOL-HC) 2.5 % rectal cream Place 1 Application rectally daily as needed for hemorrhoids.     HYDROmorphone (DILAUDID) 8 MG tablet Take 0.5 tablets (4 mg total) by mouth every 3 (three) hours. 120 tablet  0   linaclotide (LINZESS) 290 MCG CAPS capsule Take 290 mcg by mouth daily before breakfast.     magnesium oxide (MAG-OX) 400 (240 Mg) MG tablet Take 1 tablet by mouth daily.     meclizine (ANTIVERT) 25 MG tablet Take 25 mg by mouth 3 (three) times daily.     mesalamine (LIALDA) 1.2 g EC tablet Take 2.4 g by mouth daily with breakfast.     mirtazapine (REMERON) 15 MG tablet Take 15 mg by mouth at bedtime as needed (sleep).     Multiple Vitamin (MULTIVITAMIN WITH MINERALS) TABS tablet Take 1 tablet by mouth daily.     NARCAN 4 MG/0.1ML LIQD nasal spray kit Place 1 spray into the nose daily as needed (overdose).     ondansetron (ZOFRAN) 8 MG tablet Take 8 mg by mouth every 8 (eight) hours as needed for nausea or vomiting.     oxybutynin (DITROPAN) 5 MG tablet Take 10 mg by mouth at bedtime.     pantoprazole (PROTONIX) 40 MG tablet TAKE 1 TABLET BY MOUTH TWICE A DAY BEFORE A MEAL 180 tablet 1   sucralfate (CARAFATE) 1 g tablet Take 1 tablet (1 g total) by mouth every 6 (six) hours. 120 tablet 1   SUMAtriptan (IMITREX) 50 MG tablet Take 1 tablet (50 mg total) by mouth every 2 (two) hours as needed for migraine. May repeat in 2 hours if headache persists or recurs. 10 tablet 6   SYMPROIC 0.2 MG TABS Take 1 tablet by mouth daily.     thiamine (VITAMIN B-1) 100 MG tablet Take 100 mg by mouth daily at 12 noon.      torsemide (DEMADEX) 10 MG tablet Take 10 mg by mouth daily.     valACYclovir (VALTREX) 1000 MG tablet Take 1,000 mg by mouth 2 (two) times daily as needed.      botulinum toxin Type A (BOTOX) 200 units injection INJECT 155 UNITS INTRAMUSCULARLY INTO HEAD AND NECK MUSCLES EVERY 3 MONTHS 1 each 3   Allergies as of 07/25/2023 - Review Complete 07/25/2023  Allergen Reaction Noted   Ciprofloxacin Itching and Rash 04/20/2012   Coconut (cocos nucifera) Anaphylaxis and Itching 06/14/2017   Coconut oil Anaphylaxis, Itching, and Other (See Comments) 06/14/2017   Morphine and codeine Anaphylaxis, Hives, and Other (See Comments) 09/02/2011   Oxycodone Anaphylaxis 09/02/2011   Oxycodone-acetaminophen Anaphylaxis 10/08/2021   Doxycycline Nausea And Vomiting and Other (See Comments) 09/02/2011   Penicillamine Hives and Other (See Comments) 08/02/2018   Trazodone and nefazodone Rash 04/10/2019   Vistaril [hydroxyzine hcl] Hives 04/10/2019   Silicone  06/11/2022   Penicillins Rash 09/02/2011   Tape Rash 01/31/2017   Family History  Problem Relation Age of Onset   Hypertension Mother    Diabetes Mother    Sarcoidosis Mother        lungs and skin   Asthma Mother    Hypertension Father    Diabetes Father    Asthma Son    Tics Son    Asthma Sister    Cancer Sister        possible pancreatic cancer   Adrenal disorder Sister        Tumor    Asthma Brother    Asthma Daughter        died age 38.5   Cancer Daughter 4       brain; died age 38.5   Asthma Son    Cancer Paternal Aunt        brain,  colon, lung and esophagus; unsure of primary    Breast cancer Paternal Aunt    Social History   Socioeconomic History   Marital status: Single    Spouse name: Not on file   Number of children: 3   Years of education: Some college   Highest education level: Not on file  Occupational History   Occupation: English as a second language teacher    Comment: Currently out on disability d/t surg  Tobacco Use   Smoking status: Former    Current packs/day: 0.00    Average packs/day: 0.5 packs/day for 20.0 years (10.0 ttl pk-yrs)    Types: Cigarettes    Start date: 03/17/1994     Quit date: 03/17/2014    Years since quitting: 9.3   Smokeless tobacco: Never  Vaping Use   Vaping status: Never Used  Substance and Sexual Activity   Alcohol use: Yes    Alcohol/week: 0.0 standard drinks of alcohol   Drug use: No   Sexual activity: Not Currently    Birth control/protection: Surgical, Abstinence  Other Topics Concern   Not on file  Social History Narrative   Lives with 2 sons   Right Handed   Drinks caffeine2-3 cups daily   Social Determinants of Health   Financial Resource Strain: Low Risk  (04/08/2019)   Overall Financial Resource Strain (CARDIA)    Difficulty of Paying Living Expenses: Not hard at all  Food Insecurity: No Food Insecurity (01/26/2023)   Hunger Vital Sign    Worried About Running Out of Food in the Last Year: Never true    Ran Out of Food in the Last Year: Never true  Transportation Needs: No Transportation Needs (01/26/2023)   PRAPARE - Administrator, Civil Service (Medical): No    Lack of Transportation (Non-Medical): No  Physical Activity: Inactive (04/08/2019)   Exercise Vital Sign    Days of Exercise per Week: 0 days    Minutes of Exercise per Session: 0 min  Stress: Stress Concern Present (04/08/2019)   [employer] of Occupational Health - Occupational Stress Questionnaire    Feeling of Stress : To some extent  Social Connections: Unknown (01/29/2022)   [facility] from [facility]   Social Network    Social Network: Not on file  Intimate Partner Violence: Not At Risk (01/26/2023)   Humiliation, Afraid, Rape, and Kick questionnaire    Fear of Current or Ex-Partner: No    Emotionally Abused: No    Physically Abused: No    Sexually Abused: No   Review of Systems:  Review of Systems  Constitutional:  Positive for malaise/fatigue and weight loss.  Respiratory:  Negative for shortness of breath.   Cardiovascular:  Negative for chest pain.  Gastrointestinal:  Positive for abdominal pain, melena, nausea and  vomiting.    OBJECTIVE:   Temp:  [98.9 F (37.2 C)-99.1 F (37.3 C)] 98.9 F (37.2 C) (10/27 0646) Pulse Rate:  [74-106] 79 (10/27 0815) Resp:  [16-18] 16 (10/27 0646) BP: (107-121)/(72-84) 112/84 (10/27 0815) SpO2:  [98 %-100 %] 100 % (10/27 0815) Weight:  [61.2 kg] 61.2 kg (10/27 0303)   Physical Exam Constitutional:      General: She is not in acute distress.    Appearance: She is not ill-appearing, toxic-appearing or diaphoretic.  Cardiovascular:     Rate and Rhythm: Normal rate and regular rhythm.  Pulmonary:     Effort: No respiratory distress.     Breath sounds: Normal breath sounds.  Abdominal:  General: Bowel sounds are normal. There is no distension.     Palpations: Abdomen is soft.     Tenderness: There is abdominal tenderness. There is no guarding.  Musculoskeletal:     Right lower leg: No edema.     Left lower leg: No edema.  Skin:    General: Skin is warm and dry.  Neurological:     Mental Status: She is alert.     Labs: Recent Labs    07/25/23 0315 07/25/23 0840  WBC 10.9*  --   HGB 12.1 9.8*  HCT 38.6 31.0*  PLT 405*  --    BMET Recent Labs    07/25/23 0315  NA 141  K 3.6  CL 106  CO2 27  GLUCOSE 118*  BUN 25*  CREATININE 0.65  CALCIUM 9.4   LFT Recent Labs    07/25/23 0315  PROT 7.2  ALBUMIN 3.6  AST 15  ALT 17  ALKPHOS 67  BILITOT 0.6   PT/INR No results for input(s): "LABPROT", "INR" in the last 72 hours.  Diagnostic imaging: CT ANGIO ABDOMEN PELVIS  W & WO CONTRAST  Result Date: 07/25/2023 CLINICAL DATA:  Lower GI bleed. Hematemesis with bright red clots and some blood in stool. History of ulcers. EXAM: CTA ABDOMEN AND PELVIS WITHOUT AND WITH CONTRAST TECHNIQUE: Multidetector CT imaging of the abdomen and pelvis was performed using the standard protocol during bolus administration of intravenous contrast. Multiplanar reconstructed images and MIPs were obtained and reviewed to evaluate the vascular anatomy. RADIATION  DOSE REDUCTION: This exam was performed according to the departmental dose-optimization program which includes automated exposure control, adjustment of the mA and/or kV according to patient size and/or use of iterative reconstruction technique. CONTRAST:  OMNIPAQUE IOHEXOL 350 MG/ML SOLN COMPARISON:  01/25/2023. FINDINGS: VASCULAR Aorta: Normal caliber aorta without aneurysm, dissection, vasculitis or significant stenosis. Celiac: Patent without evidence of aneurysm, dissection, vasculitis or significant stenosis. Left hepatic artery originates from the aorta. SMA: Patent without evidence of aneurysm, dissection, vasculitis or significant stenosis. Right hepatic artery originates on the SMA. Renals: Both renal arteries are patent without evidence of aneurysm, dissection, vasculitis, fibromuscular dysplasia or significant stenosis. IMA: Patent without evidence of aneurysm, dissection, vasculitis or significant stenosis. Inflow: Patent without evidence of aneurysm, dissection, vasculitis or significant stenosis. Proximal Outflow: Bilateral common femoral and visualized portions of the superficial and profunda femoral arteries are patent without evidence of aneurysm, dissection, vasculitis or significant stenosis. Veins: No obvious venous abnormality within the limitations of this arterial phase study. Review of the MIP images confirms the above findings. NON-VASCULAR Lower chest: The heart is normal in size and there is a small pericardial effusion. The lung bases are clear. Hepatobiliary: No focal liver abnormality is seen. No gallstones, gallbladder wall thickening, or biliary dilatation. Pancreas: Unremarkable. No pancreatic ductal dilatation or surrounding inflammatory changes. Spleen: Normal size. A 2.0 cm hypodensity is noted in the spleen, likely cyst. Adrenals/Urinary Tract: The adrenal glands are within normal limits. No renal calculus or hydronephrosis bilaterally. The bladder is unremarkable.  Stomach/Bowel: There is a small hiatal hernia with thickening and edema of the walls of the distal esophagus. Gastric bypass surgical changes are present. Appendix appears normal. There is bowel wall thickening with surrounding edema involving extending from the gastrojejunostomy anastomosis. No bowel obstruction, free air, or pneumatosis. No acute hemorrhage is seen. Lymphatic: No significant vascular findings are present. No enlarged abdominal or pelvic lymph nodes. Reproductive: Status post hysterectomy. No adnexal masses. Other: No abdominopelvic  ascites. Musculoskeletal: A left sacral neurostimulator device is noted. 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 Degenerative changes are noted in the thoracolumbar spine. No acute osseous abnormality. IMPRESSION: VASCULAR No evidence of acute or active hemorrhage. NON-VASCULAR 1. Status post gastric bypass with bowel wall thickening and edema at the gastrojejunostomy anastomosis, which may be infectious or inflammatory. 2. Small hiatal hernia with mild thickening of the walls of the distal esophagus with associated edema, suggesting esophagitis. Electronically Signed   By: Thornell Sartorius M.D.   On: 07/25/2023 04:39    IMPRESSION: Hematemesis Blood loss anemia History anastomotic ulceration, previous bariatric surgery Epigastric pain  History ulcerative colitis   PLAN: -Recommend EGD to evaluate hematemesis, discussed procedure with patient including benefits, alternatives and risks of bleeding/infection/perforation/anesthesia, she verbalized understanding and elected to proceed -IV PPI Q12Hr -Reglan 5 mg IV once  -Trend H/H, transfuse for Hgb < 7  -NPO  -Further recommendations to follow pending endoscopy    LOS: 0 days   Liliane Shi, [facility]

## 2023-07-25 NOTE — Op Note (Signed)
[facility] Patient Name: [name] Procedure Date: 07/25/2023 MRN: [id number] Attending MD: Liliane Shi DO, DO, [id number] Date of Birth: 05-06-1976 CSN: [id number] Age: 47 Admit Type: Outpatient Procedure:                Upper GI endoscopy Indications:              Hematemesis, Melena Providers:                Liliane Shi DO, DO, Jacquelyn "Jaci" Clelia Croft, RN,                            Rozetta Nunnery, Technician Referring MD:              Medicines:                See the Anesthesia note for documentation of the                            administered medications Complications:            No immediate complications. Estimated Blood Loss:     Estimated blood loss was minimal. Procedure:                Pre-Anesthesia Assessment:                           - ASA Grade Assessment: III - A patient with severe                            systemic disease.                           - The risks and benefits of the procedure and the                            sedation options and risks were discussed with the                            patient. All questions were answered and informed                            consent was obtained.                           After obtaining informed consent, the endoscope was                            passed under direct vision. Throughout the                            procedure, the patient's blood pressure, pulse, and                            oxygen saturations were monitored continuously. The                            GIF-H190 ([id number] Olympus endoscope was introduced  through the mouth, and advanced to the proximal                            jejunum. The upper GI endoscopy was accomplished                            without difficulty. The patient tolerated the                            procedure well. Scope In: Scope Out: Findings:      The Z-line was regular and was found 40 cm from the  incisors.      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 A gastric       pouch with a 5 cm length from the GE junction to the gastroduodenal       anastomosis was found containing staples and ulceration. The       gastroduodenal anastomosis was characterized by ulceration. This was       traversed.      Two oozing cratered gastric ulcers with a visible vessel were found at       the anastomosis. The largest lesion was 7 mm in largest dimension.       Coagulation for hemostasis using bipolar probe was successful. Estimated       blood loss was minimal. To prevent bleeding post-intervention,       hemostatic clip placement was unsuccessful despite one attempt. There       was no bleeding at the end of the procedure.      The examined jejunum was normal. Impression:               - Z-line regular, 40 cm from the incisors.                           - Patent Billroth I gastroduodenostomy was found,                            characterized by ulceration.                           - Oozing gastric ulcers with a visible vessel.                            Treated with bipolar cautery. SKIP.                           - Normal examined jejunum.                           - No specimens collected. Moderate Sedation:      See the other procedure note for documentation of moderate sedation with       intraservice time. Recommendation:           - Return patient to hospital ward for ongoing care.                           - Clear liquid diet.                           -  Continue present medications.                           - Check hemoglobin q 12 hours until stable.                           - Recommend evaluation by General Surgery as this                            ulcer has been present x 6 months despite optimized                            medical therapy with PPI and sucralfate. Procedure Code(s):        --- Professional ---                           [phone],  Esophagogastroduodenoscopy, flexible,                            transoral; with control of bleeding, any method Diagnosis Code(s):        --- Professional ---                           Z98.0, Intestinal bypass and anastomosis status                           K25.4, Chronic or unspecified gastric ulcer with                            hemorrhage                           K92.0, Hematemesis                           K92.1, Melena (includes Hematochezia) CPT copyright 2022 [employer]. All rights reserved. The codes documented in this report are preliminary and upon coder review may  be revised to meet current compliance requirements. Dr Liliane Shi, DO Liliane Shi DO, DO 07/25/2023 12:37:05 PM Number of Addenda: 0

## 2023-07-25 NOTE — H&P (Signed)
History and Physical  [name] ZOX:[id number] DOB: 1976/03/03 DOA: 07/25/2023  PCP: Felix Pacini, FNP   Chief Complaint: Abdominal pain, vomiting blood  HPI: [name] is a 47 y.o. female with medical history significant for anxiety/depression, Roux-en-Y gastric bypass, GERD being admitted to the hospital for upper GI bleed.  She has a history of prior hospitalization for concern for GI bleed including April 2024, and was managed conservatively.  She takes no blood thinners.  She was in her usual state of health until couple days ago, when she started having abdominal pain, with associated nausea.  A couple of days ago, she had a normal bowel movement.  During most of the day yesterday, she was quite nauseous, actually unable to keep down any of her medications or even liquids.  Last evening, she vomited a couple times with bright red blood, and large amount of bright red clots.  Denies any chest pain, dizziness, fevers or chills.  She vomited blood once after arrival in the ER, but none since then.  Since receiving antinausea medication and IV PPI in the emergency department, she is feeling quite a bit better.  Review of Systems: Please see HPI for pertinent positives and negatives. A complete 10 system review of systems are otherwise negative.  Past Medical History:  Diagnosis Date   Anal fissure - posterior 10/16/2014    OCC  ISSUES   Anxiety    doesn't take anything   Asthma    has Albuterol inhaler as needed   Boil    on pubic area started septra 03-01-17 draining blood and pus   Bronchitis    Chronic headache disorder 07/22/2016   Clostridium difficile infection 04/20/2012   Colitis    Dehydration    Depression    Family history of adverse reaction to anesthesia    pt mom gets sick   GERD (gastroesophageal reflux disease)    takes Pantoprazole daily   Headache    Hiatal hernia    neuropathy - mild in arms and legs   History of blood transfusion     last transfusion was 04/04/2016=Benadryl was given d/t itching. States she always itches with transfusion.    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   Marginal ulcer 10/25/2017   Migraine    Motion sickness    Nausea    takes Zofran as needed   Nausea and vomiting    for 1 year   Obesity    Oligouria    Osteoarthritis    lower back, knees, wrists - no meds   Peripheral neuropathy 03/01/2019   Pneumonia 1997   Postoperative nausea and vomiting 01/21/2016   wants scopolamine patch   Right carpal tunnel syndrome 03/01/2019   SVD (spontaneous vaginal delivery)    x 4   Tingling    BOTH LOWER EXTREMETIES ALL THE TIME DUE TO RAPID WEIGHT LOSS   TOBACCO USER 10/02/2009   Qualifier: Diagnosis of  By: Carolyne Fiscal MD, Clayburn Pert     Transfusion history    last transfusion 6'16  Tremor 08/31/2018   UC (ulcerative colitis) (HCC)    supposed to be taking Lialda and Bentyl but has been off since gastric sleeve   Vertigo    doesn't take any meds   Past Surgical History:  Procedure Laterality Date   ABDOMINAL HYSTERECTOMY     PARTIAL   BIOPSY  01/20/2023   Procedure: BIOPSY;  Surgeon: Kerin Salen, MD;  Location: [facility];  Service: Gastroenterology;;   COLONOSCOPY  2007   for rectal bleeding; [facility]   COLONOSCOPY N/A 06/11/2014   Procedure: COLONOSCOPY;  Surgeon: Iva Boop, MD;  Location: [facility];  Service: Endoscopy;  Laterality: N/A;   COLONOSCOPY WITH PROPOFOL N/A 03/02/2017   Procedure: COLONOSCOPY WITH PROPOFOL;  Surgeon: Ovidio Kin, MD;  Location: [facility];  Service: General;  Laterality: N/A;   DILATION AND CURETTAGE OF UTERUS      ESOPHAGOGASTRODUODENOSCOPY (EGD) WITH PROPOFOL N/A 04/06/2016   Procedure: ESOPHAGOGASTRODUODENOSCOPY (EGD) WITH PROPOFOL;  Surgeon: Ovidio Kin, MD;  Location: [facility];  Service: General;  Laterality: N/A;   ESOPHAGOGASTRODUODENOSCOPY (EGD) WITH PROPOFOL N/A 03/02/2017   Procedure: ESOPHAGOGASTRODUODENOSCOPY (EGD) WITH PROPOFOL;  Surgeon: Ovidio Kin, MD;  Location: [facility];  Service: General;  Laterality: N/A;   ESOPHAGOGASTRODUODENOSCOPY (EGD) WITH PROPOFOL N/A 05/20/2017   Procedure: ESOPHAGOGASTRODUODENOSCOPY (EGD) WITH PROPOFOL ERAS PATHWAY;  Surgeon: Ovidio Kin, MD;  Location: [facility];  Service: General;  Laterality: N/A;   ESOPHAGOGASTRODUODENOSCOPY (EGD) WITH PROPOFOL N/A 10/07/2017   Procedure: ESOPHAGOGASTRODUODENOSCOPY (EGD) WITH PROPOFOL;  Surgeon: Ovidio Kin, MD;  Location: [facility];  Service: General;  Laterality: N/A;   ESOPHAGOGASTRODUODENOSCOPY (EGD) WITH PROPOFOL N/A 01/20/2023   Procedure: ESOPHAGOGASTRODUODENOSCOPY (EGD) WITH PROPOFOL;  Surgeon: Kerin Salen, MD;  Location: [facility];  Service: Gastroenterology;  Laterality: N/A;   EXCISION OF SKIN TAG  06/08/2017   Procedure: EXCISION OF VULVAR SKIN TAGS X2;  Surgeon: Reva Bores, MD;  Location: [facility];  Service: Gynecology;;   FOOT SURGERY Bilateral    x 2   GASTRIC ROUX-EN-Y N/A 12/14/2016   Procedure: LAPAROSCOPIC REVISION SLEEVE GASTRECTOMY TO  ROUX-Y-GASTRIC BY-PASS, UPPER ENDO;  Surgeon: Glenna Fellows, MD;  Location: [facility];  Service: General;  Laterality: N/A;   GASTROJEJUNOSTOMY N/A 05/11/2016   Procedure: LAPAROSCOPIC PLACEMENT  OF FEEDING  JEJUNOSTOMY TUBE;  Surgeon: Glenna Fellows, MD;  Location: [facility];  Service: General;  Laterality: N/A;   HEMORRHOIDECTOMY WITH HEMORRHOID BANDING     IR FLUORO GUIDE CV LINE RIGHT  04/06/2017   IR GENERIC HISTORICAL  05/19/2016   IR CM INJ ANY COLONIC TUBE W/FLUORO 05/19/2016 Irish Lack, MD [facility]   IR PATIENT EVAL TECH 0-60 MINS   11/30/2017   IR REPLC DUODEN/JEJUNO TUBE PERCUT W/FLUORO  03/14/2018   IR REPLC DUODEN/JEJUNO TUBE PERCUT W/FLUORO  03/15/2018   IR US GUIDE VASC ACCESS RIGHT  04/06/2017   j tube removed march 2018     KNEE ARTHROSCOPY Left 09/06/2014   LAPAROSCOPIC GASTRIC SLEEVE RESECTION N/A 01/14/2016   Procedure: LAPAROSCOPIC GASTRIC SLEEVE RESECTION;  Surgeon: Glenna Fellows, MD;  Location: [facility];  Service: General;  Laterality: N/A;   LAPAROSCOPIC REVISION OF GASTROJEJUNOSTOMY N/A 10/25/2017   Procedure: LAPAROSCOPIC REVISION OF GASTROJEJUNOSTOMY AND PARTIAL GASTRECTOMY, WITH PLACEMENT OF FEEDING GASTROSTOMY TUBE;  Surgeon: Glenna Fellows, MD;  Location: [facility];  Service: General;  Laterality: N/A;   LAPAROSCOPIC TUBAL LIGATION  10/16/2011   Procedure: LAPAROSCOPIC TUBAL LIGATION;  Surgeon: Esmeralda Arthur, MD;  Location: [facility];  Service: Gynecology;  Laterality: Bilateral;  NOVASURE ABLATION  09/28/2010   mild persistent vaginal bleeding   right knee arthroscopy     05/07/2016   TOTAL KNEE ARTHROPLASTY Left 03/10/2018   Procedure: LEFT TOTAL KNEE ARTHROPLASTY;  Surgeon: Durene Romans, MD;  Location: [facility];  Service: Orthopedics;  Laterality: Left;  70 mins   TOTAL KNEE ARTHROPLASTY Right 01/23/2020   Procedure: TOTAL KNEE ARTHROPLASTY, BILATERAL TROCHANTERIC INJECTION;  Surgeon: Durene Romans, MD;  Location: [facility];  Service: Orthopedics;  Laterality: Right;  70 mins for Bilateral Troch injection 1 cc Depo and 2 CC Lidocaine   TUBAL LIGATION     VAGINAL HYSTERECTOMY Bilateral 06/08/2017   Procedure: HYSTERECTOMY VAGINAL W/ BILATERAL SALPINGECTOMY;  Surgeon: Reva Bores, MD;  Location: [facility];  Service: Gynecology;  Laterality: Bilateral;   wisdom teeth extracted      Social History:  reports that she quit smoking about 9 years ago. Her smoking use included cigarettes. She started smoking about 29 years ago. She has a 10 pack-year smoking history. She has never used smokeless tobacco. She reports  current alcohol use. She reports that she does not use drugs.   Allergies  Allergen Reactions   Ciprofloxacin Itching and Rash   Coconut (Cocos Nucifera) Anaphylaxis and Itching   Coconut Oil Anaphylaxis, Itching and Other (See Comments)   Morphine And Codeine Anaphylaxis, Hives and Other (See Comments)    Pt states that she has tolerated Norco and Dilaudid  Pt states that she has tolerated Norco and Dilaudid  Pt states that she has tolerated Norco and Dilaudid  Pt states that she has tolerated Norco and Dilaudid   Oxycodone Anaphylaxis   Oxycodone-Acetaminophen Anaphylaxis   Doxycycline Nausea And Vomiting and Other (See Comments)   Other Rash    And paper tape causes a rash if wearing for a prolong period of time    And paper tape causes a rash if wearing for a prolong period of time   Penicillamine Hives and Other (See Comments)   Trazodone And Nefazodone Rash   Vistaril [Hydroxyzine Hcl] Hives   Silicone    Penicillins Rash    Has patient had a PCN reaction causing immediate rash, facial/tongue/throat swelling, SOB or lightheadedness with hypotension: Yes Has patient had a PCN reaction causing severe rash involving mucus membranes or skin necrosis: No Has patient had a PCN reaction that required hospitalization No Has patient had a PCN reaction occurring within the last 10 years: No If all of the above answers are "NO", then may proceed with Cephalosporin use.   Tape Rash    And paper tape causes a rash if wearing for a prolong period of time    Family History  Problem Relation Age of Onset   Hypertension Mother    Diabetes Mother    Sarcoidosis Mother        lungs and skin   Asthma Mother    Hypertension Father    Diabetes Father    Asthma Son    Tics Son    Asthma Sister    Cancer Sister        possible pancreatic cancer   Adrenal disorder Sister        Tumor    Asthma Brother    Asthma Daughter        died age 80.5   Cancer Daughter 4       brain; died age  27.5   Asthma Son    Cancer Paternal Aunt  brain, colon, lung and esophagus; unsure of primary    Breast cancer Paternal Aunt      Prior to Admission medications   Medication Sig Start Date End Date Taking? Authorizing Provider  albuterol (VENTOLIN HFA) 108 (90 Base) MCG/ACT inhaler Inhale 2 puffs into the lungs every 6 (six) hours as needed for wheezing or shortness of breath.   Yes [provider]  Atogepant (QULIPTA) 30 MG TABS Take 1 tablet (30 mg total) by mouth daily. 05/05/23  Yes Glean Salvo, NP  atorvastatin (LIPITOR) 40 MG tablet Take 40 mg by mouth daily. 12/10/22  Yes [provider]  buPROPion (WELLBUTRIN XL) 150 MG 24 hr tablet Take 150 mg by mouth every morning. Take along with 300 mg=450 mg   Yes [provider]  buPROPion (WELLBUTRIN XL) 300 MG 24 hr tablet Take 300 mg by mouth every morning. Take along with 150 mg=450 mg 06/05/20  Yes [provider]  busPIRone (BUSPAR) 15 MG tablet Take 15 mg by mouth 3 (three) times daily.   Yes [provider]  Cholecalciferol (VITAMIN D3) 1.25 MG (50000 UT) CAPS Take 50,000 Units by mouth every 7 (seven) days.  04/03/19  Yes [provider]  clonazePAM (KLONOPIN) 1 MG tablet Take 1 mg by mouth 2 (two) times daily as needed for anxiety.   Yes [provider]  cyanocobalamin (,VITAMIN B-12,) 1000 MCG/ML injection Inject 1,000 mcg into the muscle 2 (two) times a week.  04/06/19  Yes [provider]  diclofenac Sodium (VOLTAREN) 1 % GEL Apply topically 4 (four) times daily.   Yes [provider]  dicyclomine (BENTYL) 10 MG capsule Take 10 mg by mouth 2 (two) times daily as needed for spasms. 12/17/20  Yes [provider]  DULoxetine (CYMBALTA) 60 MG capsule Take 1 capsule (60 mg total) by mouth 2 (two) times daily. 04/10/19  Yes Aldean Baker, NP  EPINEPHrine 0.3 mg/0.3 mL IJ SOAJ injection Inject 0.3 mg into the muscle as needed for anaphylaxis. 04/12/21   Yes Haskel Schroeder, PA-C  ferrous sulfate 325 (65 FE) MG tablet Take 1 tablet (325 mg total) by mouth daily with breakfast. 01/29/23  Yes Glade Lloyd, MD  fluticasone (FLONASE) 50 MCG/ACT nasal spray Place 2 sprays into both nostrils daily as needed for allergies or rhinitis.   Yes [provider]  gabapentin (NEURONTIN) 600 MG tablet Take 600 mg by mouth 3 (three) times daily.   Yes [provider]  hydrocortisone (ANUSOL-HC) 2.5 % rectal cream Place 1 Application rectally daily as needed for hemorrhoids. 07/29/18  Yes [provider]  HYDROmorphone (DILAUDID) 8 MG tablet Take 0.5 tablets (4 mg total) by mouth every 3 (three) hours. 03/08/23  Yes   linaclotide (LINZESS) 290 MCG CAPS capsule Take 290 mcg by mouth daily before breakfast.   Yes [provider]  magnesium oxide (MAG-OX) 400 (240 Mg) MG tablet Take 1 tablet by mouth daily. 12/19/22  Yes [provider]  meclizine (ANTIVERT) 25 MG tablet Take 25 mg by mouth 3 (three) times daily. 04/15/22  Yes [provider]  mesalamine (LIALDA) 1.2 g EC tablet Take 2.4 g by mouth daily with breakfast.   Yes [provider]  mirtazapine (REMERON) 15 MG tablet Take 15 mg by mouth at bedtime as needed (sleep).   Yes [provider]  Multiple Vitamin (MULTIVITAMIN WITH MINERALS) TABS tablet Take 1 tablet by mouth daily.   Yes [provider]  [facility], In 4  MG/0.1ML LIQD nasal spray kit Place 1 spray into the nose daily as needed (overdose). 03/18/20  Yes [provider]  ondansetron (ZOFRAN) 8 MG tablet Take 8 mg by mouth every 8 (eight) hours as needed for nausea or vomiting.   Yes [provider]  oxybutynin (DITROPAN) 5 MG tablet Take 10 mg by mouth at bedtime.   Yes [provider]  pantoprazole (PROTONIX) 40 MG tablet TAKE 1 TABLET BY MOUTH TWICE A DAY BEFORE A MEAL 02/22/20  Yes Iva Boop, MD  sucralfate (CARAFATE) 1 g tablet Take 1 tablet (1  g total) by mouth every 6 (six) hours. 01/21/23  Yes Arnetha Courser, MD  SUMAtriptan (IMITREX) 50 MG tablet Take 1 tablet (50 mg total) by mouth every 2 (two) hours as needed for migraine. May repeat in 2 hours if headache persists or recurs. 05/05/23  Yes Glean Salvo, NP  SYMPROIC 0.2 MG TABS Take 1 tablet by mouth daily. 05/13/21  Yes [provider]  thiamine (VITAMIN B-1) 100 MG tablet Take 100 mg by mouth daily at 12 noon.    Yes [provider]  torsemide (DEMADEX) 10 MG tablet Take 10 mg by mouth daily. 11/10/21  Yes [provider]  valACYclovir (VALTREX) 1000 MG tablet Take 1,000 mg by mouth 2 (two) times daily as needed.   Yes [provider]  botulinum toxin Type A (BOTOX) 200 units injection INJECT 155 UNITS INTRAMUSCULARLY INTO HEAD AND NECK MUSCLES EVERY 3 MONTHS 11/23/22   Levert Feinstein, MD    Physical Exam: BP 114/73   Pulse 76   Temp 98.9 F (37.2 C) (Oral)   Resp 16   Ht 5\' 7"  (1.702 m)   Wt 61.2 kg   LMP 06/02/2017 Comment: hysterectomy 06/08/2017  SpO2 99%   BMI 21.14 kg/m   General:  Alert, oriented, calm, in no acute distress  Eyes: EOMI, clear conjuctivae, white sclerea Neck: supple, no masses, trachea mildline  Cardiovascular: RRR, no murmurs or rubs, no peripheral edema  Respiratory: clear to auscultation bilaterally, no wheezes, no crackles  Abdomen: soft, diffusely tender, nondistended, normal bowel tones heard  Skin: dry, no rashes  Musculoskeletal: no joint effusions, normal range of motion  Psychiatric: appropriate affect, normal speech  Neurologic: extraocular muscles intact, clear speech, moving all extremities with intact sensorium         Labs on Admission:  Basic Metabolic Panel: Recent Labs  Lab 07/25/23 0315  NA 141  K 3.6  CL 106  CO2 27  GLUCOSE 118*  BUN 25*  CREATININE 0.65  CALCIUM 9.4   Liver Function Tests: Recent Labs  Lab 07/25/23 0315  AST 15  ALT 17  ALKPHOS 67  BILITOT 0.6  PROT 7.2   ALBUMIN 3.6   Recent Labs  Lab 07/25/23 0315  LIPASE 30   No results for input(s): "AMMONIA" in the last 168 hours. CBC: Recent Labs  Lab 07/25/23 0315  WBC 10.9*  HGB 12.1  HCT 38.6  MCV 92.1  PLT 405*   Cardiac Enzymes: No results for input(s): "CKTOTAL", "CKMB", "CKMBINDEX", "TROPONINI" in the last 168 hours.  BNP (last 3 results) No results for input(s): "BNP" in the last 8760 hours.  ProBNP (last 3 results) No results for input(s): "PROBNP" in the last 8760 hours.  CBG: No results for input(s): "GLUCAP" in the last 168 hours.  Radiological Exams on Admission: CT ANGIO ABDOMEN PELVIS  W & WO CONTRAST  Result Date: 07/25/2023 CLINICAL DATA:  Lower GI bleed. Hematemesis with bright red clots and some blood in stool. History of ulcers. EXAM: CTA ABDOMEN AND PELVIS WITHOUT AND WITH CONTRAST TECHNIQUE: Multidetector CT imaging of the abdomen and pelvis was performed using the standard protocol during bolus administration of intravenous contrast. Multiplanar reconstructed images and MIPs were obtained and reviewed to evaluate the vascular anatomy. RADIATION DOSE REDUCTION: This exam was performed according to the departmental dose-optimization program which includes automated exposure control, adjustment of the mA and/or kV according to patient size and/or use of iterative reconstruction technique. CONTRAST:  OMNIPAQUE IOHEXOL 350 MG/ML SOLN COMPARISON:  01/25/2023. FINDINGS: VASCULAR Aorta: Normal caliber aorta without aneurysm, dissection, vasculitis or significant stenosis. Celiac: Patent without evidence of aneurysm, dissection, vasculitis or significant stenosis. Left hepatic artery originates from the aorta. SMA: Patent without evidence of aneurysm, dissection, vasculitis or significant stenosis. Right hepatic artery originates on the SMA. Renals: Both renal arteries are patent without evidence of aneurysm, dissection, vasculitis, fibromuscular dysplasia or significant  stenosis. IMA: Patent without evidence of aneurysm, dissection, vasculitis or significant stenosis. Inflow: Patent without evidence of aneurysm, dissection, vasculitis or significant stenosis. Proximal Outflow: Bilateral common femoral and visualized portions of the superficial and profunda femoral arteries are patent without evidence of aneurysm, dissection, vasculitis or significant stenosis. Veins: No obvious venous abnormality within the limitations of this arterial phase study. Review of the MIP images confirms the above findings. NON-VASCULAR Lower chest: The heart is normal in size and there is a small pericardial effusion. The lung bases are clear. Hepatobiliary: No focal liver abnormality is seen. No gallstones, gallbladder wall thickening, or biliary dilatation. Pancreas: Unremarkable. No pancreatic ductal dilatation or surrounding inflammatory changes. Spleen: Normal size. A 2.0 cm hypodensity is noted in the spleen, likely cyst. Adrenals/Urinary Tract: The adrenal glands are within normal limits. No renal calculus or hydronephrosis bilaterally. The bladder is unremarkable. Stomach/Bowel: There is a small hiatal hernia with thickening and edema of the walls of the distal esophagus. Gastric bypass surgical changes are present. Appendix appears normal. There is bowel wall thickening with surrounding edema involving extending from the gastrojejunostomy anastomosis. No bowel obstruction, free air, or pneumatosis. No acute hemorrhage is seen. Lymphatic: No significant vascular findings are present. No enlarged abdominal or pelvic lymph nodes. Reproductive: Status post hysterectomy. No adnexal masses. Other: No abdominopelvic ascites. Musculoskeletal: A left sacral neurostimulator device is noted. 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 Degenerative changes are noted in the thoracolumbar spine. No acute osseous abnormality. IMPRESSION: VASCULAR No evidence of acute or active hemorrhage.  NON-VASCULAR 1. Status post gastric bypass with bowel wall thickening and edema at the gastrojejunostomy anastomosis, which may be infectious or inflammatory. 2. Small hiatal hernia with mild thickening of the walls of the distal esophagus with associated edema, suggesting esophagitis. Electronically Signed   By: Thornell Sartorius M.D.   On: 07/25/2023 04:39    Assessment/Plan [name] is a 47 y.o. female with medical history significant for anxiety/depression, Roux-en-Y gastric bypass, GERD being admitted to the hospital for upper GI bleed.  Upper GI bleed-with abdominal pain, hematemesis.  Concern is for anastomotic bleed, at the site of known gastric bypass anastomotic ulcers.  Patient is not on any blood thinners.  She is hemodynamically stable without evidence of active bleeding. -Observation admission to progressive -Keep n.p.o. for now -Avoid blood thinners -Trend hemoglobin every 8 hours -Appreciate GI consultation and recommendations -IV PPI daily  Anxiety/depression-continue home Wellbutrin XL, BuSpar, Cymbalta, Klonopin as needed  Chronic  migraine-continue atogepant daily, with Imitrex as needed  Leukocytosis-mild, likely reactive from vomiting  Thrombocytosis-likely acute response to stress of upper GI bleeding  DVT prophylaxis: SCDs only    Code Status: Full Code  Consults called: EDP discussed with [facility] Dr. Lorenso Quarry  Admission status: Observation  Time spent: 45 minutes  Inger Wiest Sharlette Dense MD [facility] Pager [phone]  If 7PM-7AM, please contact night-coverage [email or web address] Password [facility]  07/25/2023, 7:44 AM

## 2023-07-25 NOTE — ED Provider Notes (Signed)
[place] EMERGENCY DEPARTMENT AT [facility] Provider Note  CSN: [id number] Arrival date & time: 07/25/23 4098  Chief Complaint(s) No chief complaint on file.  HPI [name] is a 47 y.o. female with PMH gastric bypass, GERD, ulcerative colitis who presents emergency room for evaluation of abdominal pain and hematemesis.  Patient states that approximately 2 hours prior to arrival she had multiple episodes of bright red hematemesis.  Not on blood thinners.  Denies alcohol use or history of varices.  She arrives with multiple tissues covered in bright red blood.  Endorsing primarily epigastric abdominal pain at this time.  Denies chest pain, shortness of breath, headache, fever or other systemic symptoms.  No hematochezia.   Past Medical History Past Medical History:  Diagnosis Date   Anal fissure - posterior 10/16/2014    OCC  ISSUES   Anxiety    doesn't take anything   Asthma    has Albuterol inhaler as needed   Boil    on pubic area started septra 03-01-17 draining blood and pus   Bronchitis    Chronic headache disorder 07/22/2016   Clostridium difficile infection 04/20/2012   Colitis    Dehydration    Depression    Family history of adverse reaction to anesthesia    pt mom gets sick   GERD (gastroesophageal reflux disease)    takes Pantoprazole daily   Headache    Hiatal hernia    neuropathy - mild in arms and legs   History of blood transfusion    last transfusion was 04/04/2016=Benadryl was given d/t itching. States she always itches with transfusion.    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   Marginal ulcer 10/25/2017    Migraine    Motion sickness    Nausea    takes Zofran as needed   Nausea and vomiting    for 1 year   Obesity    Oligouria    Osteoarthritis    lower back, knees, wrists - no meds   Peripheral neuropathy 03/01/2019   Pneumonia 1997   Postoperative nausea and vomiting 01/21/2016   wants scopolamine patch   Right carpal tunnel syndrome 03/01/2019   SVD (spontaneous vaginal delivery)    x 4   Tingling    BOTH LOWER EXTREMETIES ALL THE TIME DUE TO RAPID WEIGHT LOSS   TOBACCO USER 10/02/2009   Qualifier: Diagnosis of  By: Carolyne Fiscal MD, Clayburn Pert     Transfusion history    last transfusion 6'16    Tremor 08/31/2018   UC (ulcerative colitis) (HCC)    supposed to be taking Lialda and Bentyl but has been off since gastric sleeve   Vertigo    doesn't take any meds   Patient Active Problem List   Diagnosis Date Noted   Abdominal pain with vomiting 01/26/2023   Depression with anxiety 01/26/2023   Enteritis 01/26/2023   Malnutrition of moderate degree (HCC) 01/20/2023   Normocytic anemia 01/20/2023   Intractable nausea and vomiting 01/18/2023   Intractable abdominal pain 01/17/2023   Pituitary  microadenoma (HCC) 06/11/2022   Yeast infection 02/07/2020   History of urinary urgency 02/07/2020   Urinary frequency 02/07/2020   Dysuria 02/07/2020   Chronic migraine without aura, with intractable migraine, so stated, with status migrainosus 02/05/2020   S/P right TKA 01/23/2020   Status post total knee replacement, right 01/23/2020   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   Chronic low back pain 10/25/2019   Headache 05/04/2019   Gait instability 05/04/2019   Suicidal ideations    MDD (major depressive disorder), recurrent severe, 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   Peripheral neuropathy 03/01/2019   Right carpal tunnel syndrome 03/01/2019   Chronic constipation 01/26/2019   S/P left TKA 03/10/2018   B12 deficiency 12/01/2017   Malnutrition, calorie (HCC) 03/27/2017   Psychophysiological insomnia  03/18/2017   Paresthesia 07/22/2016   Chronic headache disorder 07/22/2016   Memory difficulty 07/22/2016   Upper GI bleeding 04/04/2016   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 Authorizing Provider  albuterol (VENTOLIN HFA) 108 (90 Base) MCG/ACT inhaler Inhale 2 puffs into the lungs every 6 (six) hours as needed for wheezing or shortness of breath.    [provider]  Atogepant (QULIPTA) 30 MG TABS Take 1 tablet (30 mg total) by mouth daily. 05/05/23   Glean Salvo, NP  atorvastatin (LIPITOR) 40 MG tablet Take 40 mg by mouth daily. 12/10/22   [provider]  botulinum toxin Type A (BOTOX) 200 units injection INJECT 155 UNITS INTRAMUSCULARLY INTO HEAD AND NECK MUSCLES EVERY 3 MONTHS 11/23/22   Levert Feinstein, MD  buPROPion (WELLBUTRIN XL) 150 MG 24 hr tablet Take 150 mg by mouth every morning. Take along with 300 mg=450 mg    [provider]  buPROPion (WELLBUTRIN XL) 300 MG 24 hr tablet Take 300 mg by mouth every morning. Take along with 150 mg=450 mg 06/05/20   [provider]  busPIRone (BUSPAR) 15 MG tablet Take 15 mg by mouth 3 (three) times daily.    [provider]  Cholecalciferol (VITAMIN D3) 1.25 MG (50000 UT) CAPS Take 50,000 Units by mouth every 7 (seven) days.  04/03/19   [provider]  clonazePAM (KLONOPIN) 1 MG tablet Take 1 mg by mouth 2 (two) times daily.    [provider]  cyanocobalamin (,VITAMIN B-12,) 1000 MCG/ML injection Inject 1,000 mcg into the muscle 2 (two) times a week.  04/06/19   [provider]  diclofenac Sodium (VOLTAREN) 1 % GEL Apply topically 4 (four) times daily.     [provider]  dicyclomine (BENTYL) 10 MG capsule Take 10 mg by mouth 2 (two) times daily as needed for spasms. 12/17/20   [provider]  DULoxetine (CYMBALTA) 60 MG capsule Take 1 capsule (60 mg total) by mouth 2 (two) times daily. 04/10/19   Aldean Baker, NP  EPINEPHrine 0.3 mg/0.3 mL IJ SOAJ injection Inject 0.3 mg into the muscle as needed for anaphylaxis. 04/12/21   Haskel Schroeder, PA-C  ferrous sulfate 325 (65 FE) MG tablet Take 1 tablet (325 mg total) by mouth daily with breakfast. 01/29/23   Glade Lloyd, MD  fluticasone (FLONASE) 50 MCG/ACT nasal spray Place 2 sprays into both nostrils daily as  needed for allergies or rhinitis.    [provider]  gabapentin (NEURONTIN) 400 MG capsule Take 400 mg by mouth 3 (three) times daily.    [provider]  hydrocortisone (ANUSOL-HC) 2.5 % rectal cream Place 1 Application rectally daily as needed for hemorrhoids. 07/29/18   [provider]  HYDROmorphone (DILAUDID) 8 MG tablet Take 0.5 tablets (4 mg total) by mouth every 3 (three) hours. 03/08/23     linaclotide (LINZESS) 290 MCG CAPS capsule Take 290 mcg by mouth daily before breakfast.    [provider]  magnesium oxide (MAG-OX) 400 (240 Mg) MG tablet Take 1 tablet by mouth daily. 12/19/22   [provider]  meclizine (ANTIVERT) 25 MG tablet Take 25 mg by mouth 3 (three) times daily. 04/15/22   [provider]  mesalamine (LIALDA) 1.2 g EC tablet Take 2.4 g by mouth daily with breakfast.    [provider]  mirtazapine (REMERON) 15 MG tablet Take 15 mg by mouth at bedtime as needed (sleep).    [provider]  Multiple Vitamin (MULTIVITAMIN WITH MINERALS) TABS tablet Take 1 tablet by mouth daily.    [provider]  NARCAN 4 MG/0.1ML LIQD nasal spray kit Place 1 spray into the nose daily as needed (overdose). 03/18/20   [provider]  ondansetron (ZOFRAN) 8 MG tablet Take 8 mg by mouth  every 8 (eight) hours as needed for nausea or vomiting.    [provider]  oxybutynin (DITROPAN) 5 MG tablet Take 5 mg by mouth daily.    [provider]  pantoprazole (PROTONIX) 40 MG tablet TAKE 1 TABLET BY MOUTH TWICE A DAY BEFORE A MEAL 02/22/20   Iva Boop, MD  sucralfate (CARAFATE) 1 g tablet Take 1 tablet (1 g total) by mouth every 6 (six) hours. 01/21/23   Arnetha Courser, MD  SUMAtriptan (IMITREX) 50 MG tablet Take 1 tablet (50 mg total) by mouth every 2 (two) hours as needed for migraine. May repeat in 2 hours if headache persists or recurs. 05/05/23   Glean Salvo, NP  SYMPROIC 0.2 MG TABS Take 1 tablet by mouth daily. 05/13/21   [provider]  thiamine (VITAMIN B-1) 100 MG tablet Take 100 mg by mouth daily at 12 noon.     [provider]  torsemide (DEMADEX) 10 MG tablet Take 10 mg by mouth daily. 11/10/21   [provider]  valACYclovir (VALTREX) 1000 MG tablet Take 1,000 mg by mouth 2 (two) times daily.    [provider]                                                                                                                                    Past Surgical History Past Surgical History:  Procedure Laterality Date   ABDOMINAL HYSTERECTOMY     PARTIAL   BIOPSY  01/20/2023   Procedure: BIOPSY;  Surgeon: Marca Ancona,  Marcos Eke, MD;  Location: [facility];  Service: Gastroenterology;;   COLONOSCOPY  2007   for rectal bleeding; [facility]   COLONOSCOPY N/A 06/11/2014   Procedure: COLONOSCOPY;  Surgeon: Iva Boop, MD;  Location: [facility];  Service: Endoscopy;  Laterality: N/A;   COLONOSCOPY WITH PROPOFOL N/A 03/02/2017   Procedure: COLONOSCOPY WITH PROPOFOL;  Surgeon: Ovidio Kin, MD;  Location: [facility];  Service: General;  Laterality: N/A;   DILATION AND CURETTAGE OF UTERUS     ESOPHAGOGASTRODUODENOSCOPY (EGD) WITH PROPOFOL N/A 04/06/2016   Procedure: ESOPHAGOGASTRODUODENOSCOPY (EGD) WITH PROPOFOL;  Surgeon: Ovidio Kin, MD;  Location: [facility];  Service: General;  Laterality: N/A;   ESOPHAGOGASTRODUODENOSCOPY (EGD) WITH PROPOFOL N/A 03/02/2017   Procedure: ESOPHAGOGASTRODUODENOSCOPY (EGD) WITH PROPOFOL;  Surgeon: Ovidio Kin, MD;  Location: [facility];  Service: General;  Laterality: N/A;   ESOPHAGOGASTRODUODENOSCOPY (EGD) WITH PROPOFOL N/A 05/20/2017   Procedure: ESOPHAGOGASTRODUODENOSCOPY (EGD) WITH PROPOFOL ERAS PATHWAY;  Surgeon: Ovidio Kin, MD;  Location: [facility];  Service: General;  Laterality: N/A;   ESOPHAGOGASTRODUODENOSCOPY (EGD) WITH PROPOFOL N/A 10/07/2017   Procedure: ESOPHAGOGASTRODUODENOSCOPY (EGD) WITH PROPOFOL;  Surgeon: Ovidio Kin, MD;  Location: [facility];  Service: General;  Laterality: N/A;   ESOPHAGOGASTRODUODENOSCOPY (EGD) WITH PROPOFOL N/A 01/20/2023   Procedure: ESOPHAGOGASTRODUODENOSCOPY (EGD) WITH PROPOFOL;  Surgeon: Kerin Salen, MD;  Location: [facility];  Service: Gastroenterology;  Laterality: N/A;   EXCISION OF SKIN TAG  06/08/2017   Procedure: EXCISION OF VULVAR SKIN TAGS X2;  Surgeon: Reva Bores, MD;  Location: [facility];  Service: Gynecology;;   FOOT SURGERY Bilateral    x 2   GASTRIC ROUX-EN-Y N/A 12/14/2016   Procedure: LAPAROSCOPIC REVISION SLEEVE GASTRECTOMY TO  ROUX-Y-GASTRIC BY-PASS, UPPER ENDO;  Surgeon: Glenna Fellows, MD;  Location: [facility];  Service: General;  Laterality: N/A;   GASTROJEJUNOSTOMY N/A 05/11/2016   Procedure: LAPAROSCOPIC PLACEMENT  OF FEEDING  JEJUNOSTOMY TUBE;  Surgeon: Glenna Fellows, MD;  Location: [facility];  Service: General;  Laterality: N/A;   HEMORRHOIDECTOMY WITH HEMORRHOID BANDING     IR FLUORO GUIDE CV LINE RIGHT  04/06/2017   IR GENERIC HISTORICAL  05/19/2016   IR CM INJ ANY COLONIC TUBE W/FLUORO 05/19/2016 Irish Lack, MD [facility]   IR PATIENT EVAL TECH 0-60 MINS  11/30/2017   IR REPLC DUODEN/JEJUNO TUBE PERCUT W/FLUORO  03/14/2018   IR REPLC DUODEN/JEJUNO TUBE PERCUT W/FLUORO  03/15/2018   IR US GUIDE VASC  ACCESS RIGHT  04/06/2017   j tube removed march 2018     KNEE ARTHROSCOPY Left 09/06/2014   LAPAROSCOPIC GASTRIC SLEEVE RESECTION N/A 01/14/2016   Procedure: LAPAROSCOPIC GASTRIC SLEEVE RESECTION;  Surgeon: Glenna Fellows, MD;  Location: [facility];  Service: General;  Laterality: N/A;   LAPAROSCOPIC REVISION OF GASTROJEJUNOSTOMY N/A 10/25/2017   Procedure: LAPAROSCOPIC REVISION OF GASTROJEJUNOSTOMY AND PARTIAL GASTRECTOMY, WITH PLACEMENT OF FEEDING GASTROSTOMY TUBE;  Surgeon: Glenna Fellows, MD;  Location: [facility];  Service: General;  Laterality: N/A;   LAPAROSCOPIC TUBAL LIGATION  10/16/2011   Procedure: LAPAROSCOPIC TUBAL LIGATION;  Surgeon: Esmeralda Arthur, MD;  Location: [facility];  Service: Gynecology;  Laterality: Bilateral;   NOVASURE ABLATION  09/28/2010   mild persistent vaginal bleeding   right knee arthroscopy     05/07/2016   TOTAL KNEE ARTHROPLASTY Left 03/10/2018   Procedure: LEFT TOTAL KNEE ARTHROPLASTY;  Surgeon: Durene Romans, MD;  Location: [facility];  Service: Orthopedics;  Laterality: Left;  70 mins   TOTAL KNEE ARTHROPLASTY Right 01/23/2020   Procedure: TOTAL  KNEE ARTHROPLASTY, BILATERAL TROCHANTERIC INJECTION;  Surgeon: Durene Romans, MD;  Location: [facility];  Service: Orthopedics;  Laterality: Right;  70 mins for Bilateral Troch injection 1 cc Depo and 2 CC Lidocaine   TUBAL LIGATION     VAGINAL HYSTERECTOMY Bilateral 06/08/2017   Procedure: HYSTERECTOMY VAGINAL W/ BILATERAL SALPINGECTOMY;  Surgeon: Reva Bores, MD;  Location: [facility];  Service: Gynecology;  Laterality: Bilateral;   wisdom teeth extracted     Family History Family History  Problem Relation Age of Onset   Hypertension Mother    Diabetes Mother    Sarcoidosis Mother        lungs and skin   Asthma Mother    Hypertension Father    Diabetes Father    Asthma Son    Tics Son    Asthma Sister    Cancer Sister        possible pancreatic cancer   Adrenal disorder Sister        Tumor    Asthma Brother     Asthma Daughter        died age 28.5   Cancer Daughter 4       brain; died age 28.5   Asthma Son    Cancer Paternal Aunt        brain, colon, lung and esophagus; unsure of primary    Breast cancer Paternal Aunt     Social History Social History   Tobacco Use   Smoking status: Former    Current packs/day: 0.00    Average packs/day: 0.5 packs/day for 20.0 years (10.0 ttl pk-yrs)    Types: Cigarettes    Start date: 03/17/1994    Quit date: 03/17/2014    Years since quitting: 9.3   Smokeless tobacco: Never  Vaping Use   Vaping status: Never Used  Substance Use Topics   Alcohol use: Yes    Alcohol/week: 0.0 standard drinks of alcohol   Drug use: No   Allergies Ciprofloxacin, Coconut (cocos nucifera), Coconut oil, Morphine and codeine, Oxycodone, Oxycodone-acetaminophen, Doxycycline, Other, Penicillamine, Trazodone and nefazodone, Vistaril [hydroxyzine hcl], Silicone, Penicillins, and Tape  Review of Systems Review of Systems  Gastrointestinal:  Positive for abdominal pain and vomiting.    Physical Exam Vital Signs  I have reviewed the triage vital signs BP 121/82 (BP Location: Right Arm)   Pulse (!) 106   Temp 99.1 F (37.3 C) (Oral)   Resp 18   Ht 5\' 7"  (1.702 m)   Wt 61.2 kg   LMP 06/02/2017 Comment: hysterectomy 06/08/2017  SpO2 100%   BMI 21.14 kg/m   Physical Exam  ED Results and Treatments Labs (all labs ordered are listed, but only abnormal results are displayed) Labs Reviewed  COMPREHENSIVE METABOLIC PANEL - Abnormal; Notable for the following components:      Result Value   Glucose, Bld 118 (*)    BUN 25 (*)    All other components within normal limits  CBC - Abnormal; Notable for the following components:   WBC 10.9 (*)    RDW 15.9 (*)    Platelets 405 (*)    All other components within normal limits  URINALYSIS, ROUTINE W REFLEX MICROSCOPIC - Abnormal; Notable for the following components:   Color, Urine AMBER (*)    APPearance CLOUDY (*)     Specific Gravity, Urine 1.036 (*)    Bilirubin Urine SMALL (*)    Ketones, ur 5 (*)    Protein, ur 100 (*)    Leukocytes,Ua SMALL (*)  All other components within normal limits  LIPASE, BLOOD  PROTIME-INR  TYPE AND SCREEN                                                                                                                          Radiology CT ANGIO ABDOMEN PELVIS  W & WO CONTRAST  Result Date: 07/25/2023 CLINICAL DATA:  Lower GI bleed. Hematemesis with bright red clots and some blood in stool. History of ulcers. EXAM: CTA ABDOMEN AND PELVIS WITHOUT AND WITH CONTRAST TECHNIQUE: Multidetector CT imaging of the abdomen and pelvis was performed using the standard protocol during bolus administration of intravenous contrast. Multiplanar reconstructed images and MIPs were obtained and reviewed to evaluate the vascular anatomy. RADIATION DOSE REDUCTION: This exam was performed according to the departmental dose-optimization program which includes automated exposure control, adjustment of the mA and/or kV according to patient size and/or use of iterative reconstruction technique. CONTRAST:  OMNIPAQUE IOHEXOL 350 MG/ML SOLN COMPARISON:  01/25/2023. FINDINGS: VASCULAR Aorta: Normal caliber aorta without aneurysm, dissection, vasculitis or significant stenosis. Celiac: Patent without evidence of aneurysm, dissection, vasculitis or significant stenosis. Left hepatic artery originates from the aorta. SMA: Patent without evidence of aneurysm, dissection, vasculitis or significant stenosis. Right hepatic artery originates on the SMA. Renals: Both renal arteries are patent without evidence of aneurysm, dissection, vasculitis, fibromuscular dysplasia or significant stenosis. IMA: Patent without evidence of aneurysm, dissection, vasculitis or significant stenosis. Inflow: Patent without evidence of aneurysm, dissection, vasculitis or significant stenosis. Proximal Outflow: Bilateral common femoral  and visualized portions of the superficial and profunda femoral arteries are patent without evidence of aneurysm, dissection, vasculitis or significant stenosis. Veins: No obvious venous abnormality within the limitations of this arterial phase study. Review of the MIP images confirms the above findings. NON-VASCULAR Lower chest: The heart is normal in size and there is a small pericardial effusion. The lung bases are clear. Hepatobiliary: No focal liver abnormality is seen. No gallstones, gallbladder wall thickening, or biliary dilatation. Pancreas: Unremarkable. No pancreatic ductal dilatation or surrounding inflammatory changes. Spleen: Normal size. A 2.0 cm hypodensity is noted in the spleen, likely cyst. Adrenals/Urinary Tract: The adrenal glands are within normal limits. No renal calculus or hydronephrosis bilaterally. The bladder is unremarkable. Stomach/Bowel: There is a small hiatal hernia with thickening and edema of the walls of the distal esophagus. Gastric bypass surgical changes are present. Appendix appears normal. There is bowel wall thickening with surrounding edema involving extending from the gastrojejunostomy anastomosis. No bowel obstruction, free air, or pneumatosis. No acute hemorrhage is seen. Lymphatic: No significant vascular findings are present. No enlarged abdominal or pelvic lymph nodes. Reproductive: Status post hysterectomy. No adnexal masses. Other: No abdominopelvic ascites. Musculoskeletal: A left sacral neurostimulator device is noted. 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 Degenerative changes are noted in the thoracolumbar spine. No acute osseous abnormality. IMPRESSION: VASCULAR No evidence of acute or active hemorrhage. NON-VASCULAR 1. Status post gastric bypass with bowel wall thickening  and edema at the gastrojejunostomy anastomosis, which may be infectious or inflammatory. 2. Small hiatal hernia with mild thickening of the walls of the distal esophagus with  associated edema, suggesting esophagitis. Electronically Signed   By: Thornell Sartorius M.D.   On: 07/25/2023 04:39    Pertinent labs & imaging results that were available during my care of the patient were reviewed by me and considered in my medical decision making (see MDM for details).  Medications Ordered in ED Medications  ondansetron (ZOFRAN-ODT) disintegrating tablet 4 mg (4 mg Oral Given 07/25/23 0448)  fentaNYL (SUBLIMAZE) injection 50 mcg (50 mcg Intravenous Given 07/25/23 0339)  ondansetron (ZOFRAN) injection 4 mg (4 mg Intravenous Given 07/25/23 0338)  iohexol (OMNIPAQUE) 350 MG/ML injection 100 mL (100 mLs Intravenous Contrast Given 07/25/23 0402)  pantoprazole (PROTONIX) injection 40 mg (40 mg Intravenous Given 07/25/23 0448)                                                                                                                                     Procedures Procedures  (including critical care time)  Medical Decision Making / ED Course   This patient presents to the ED for concern of abdominal pain, hematemesis, this involves an extensive number of treatment options, and is a complaint that carries with it a high risk of complications and morbidity.  The differential diagnosis includes marginal ulcer, upper GI bleed including PUD, varices, boerhaave's, Mallory Weiss tear, aortoenteric fistula versus lower GI bleed including mass, diverticulosis/diverticulitis, hemorrhoids, anal fissure, mesenteric ischemia, aortoenteric fistula, colitis  MDM: Patient seen emergency room for evaluation of hematemesis and abdominal pain.  Physical exam with epigastric tenderness to palpation and active bright red hematemesis.  Laboratory evaluation with leukocytosis to 10.9, stable hemoglobin at 12.1, lipase normal.  CT angio abdomen pelvis showing bowel wall thickening and edema around the anastomosis as well as secondary signs of esophagitis.  Patient started on a PPI and pain controlled in  the ED.  Spoke with Dr. Lorenso Quarry of [facility] and neurology team will evaluate the patient while inpatient today.  Patient require hospital admission for hematemesis and concern for possible marginal ulcer.   Additional history obtained:  -External records from outside source obtained and reviewed including: Chart review including previous notes, labs, imaging, consultation notes   Lab Tests: -I ordered, reviewed, and interpreted labs.   The pertinent results include:   Labs Reviewed  COMPREHENSIVE METABOLIC PANEL - Abnormal; Notable for the following components:      Result Value   Glucose, Bld 118 (*)    BUN 25 (*)    All other components within normal limits  CBC - Abnormal; Notable for the following components:   WBC 10.9 (*)    RDW 15.9 (*)    Platelets 405 (*)    All other components within normal limits  URINALYSIS, ROUTINE W REFLEX MICROSCOPIC - Abnormal; Notable for the following components:  Color, Urine AMBER (*)    APPearance CLOUDY (*)    Specific Gravity, Urine 1.036 (*)    Bilirubin Urine SMALL (*)    Ketones, ur 5 (*)    Protein, ur 100 (*)    Leukocytes,Ua SMALL (*)    All other components within normal limits  LIPASE, BLOOD  PROTIME-INR  TYPE AND SCREEN       Imaging Studies ordered: I ordered imaging studies including CT angio abdomen pelvis I independently visualized and interpreted imaging. I agree with the radiologist interpretation   Medicines ordered and prescription drug management: Meds ordered this encounter  Medications   ondansetron (ZOFRAN-ODT) disintegrating tablet 4 mg   fentaNYL (SUBLIMAZE) injection 50 mcg   ondansetron (ZOFRAN) injection 4 mg   iohexol (OMNIPAQUE) 350 MG/ML injection 100 mL   pantoprazole (PROTONIX) injection 40 mg    -I have reviewed the patients home medicines and have made adjustments as needed  Critical interventions none  Consultations Obtained: I requested consultation with the gastroenterologist  on-call Dr. Lorenso Quarry,  and discussed lab and imaging findings as well as pertinent plan - they recommend: GI evaluation today   Cardiac Monitoring: The patient was maintained on a cardiac monitor.  I personally viewed and interpreted the cardiac monitored which showed an underlying rhythm of: NSR  Social Determinants of Health:  Factors impacting patients care include: none   Reevaluation: After the interventions noted above, I reevaluated the patient and found that they have :improved  Co morbidities that complicate the patient evaluation  Past Medical History:  Diagnosis Date   Anal fissure - posterior 10/16/2014    OCC  ISSUES   Anxiety    doesn't take anything   Asthma    has Albuterol inhaler as needed   Boil    on pubic area started septra 03-01-17 draining blood and pus   Bronchitis    Chronic headache disorder 07/22/2016   Clostridium difficile infection 04/20/2012   Colitis    Dehydration    Depression    Family history of adverse reaction to anesthesia    pt mom gets sick   GERD (gastroesophageal reflux disease)    takes Pantoprazole daily   Headache    Hiatal hernia    neuropathy - mild in arms and legs   History of blood transfusion    last transfusion was 04/04/2016=Benadryl was given d/t itching. States she always itches with transfusion.    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   Marginal ulcer 10/25/2017   Migraine    Motion sickness    Nausea    takes Zofran as needed   Nausea and vomiting    for 1 year   Obesity    Oligouria    Osteoarthritis    lower back, knees, wrists - no meds   Peripheral neuropathy 03/01/2019    Pneumonia 1997   Postoperative nausea and vomiting 01/21/2016   wants scopolamine patch   Right carpal tunnel syndrome 03/01/2019   SVD (spontaneous vaginal delivery)    x 4   Tingling  BOTH LOWER EXTREMETIES ALL THE TIME DUE TO RAPID WEIGHT LOSS   TOBACCO USER 10/02/2009   Qualifier: Diagnosis of  By: Carolyne Fiscal MD, Clayburn Pert     Transfusion history    last transfusion 6'16    Tremor 08/31/2018   UC (ulcerative colitis) (HCC)    supposed to be taking Lialda and Bentyl but has been off since gastric sleeve   Vertigo    doesn't take any meds      Dispostion: I considered admission for this patient, and given active hematemesis patient require hospital admission     Final Clinical Impression(s) / ED Diagnoses Final diagnoses:  None     @PCDICTATION @    Glendora Score, MD 07/25/23 [phone]

## 2023-07-25 NOTE — Anesthesia Postprocedure Evaluation (Signed)
Anesthesia Post Note  Patient: [name]  Procedure(s) Performed: ESOPHAGOGASTRODUODENOSCOPY (EGD) HOT HEMOSTASIS (ARGON PLASMA COAGULATION/BICAP) HEMOSTASIS CLIP PLACEMENT     Patient location during evaluation: PACU Anesthesia Type: MAC Level of consciousness: oriented, awake and alert and awake Pain management: pain level controlled Vital Signs Assessment: post-procedure vital signs reviewed and stable Respiratory status: spontaneous breathing, nonlabored ventilation, respiratory function stable and patient connected to nasal cannula oxygen Cardiovascular status: blood pressure returned to baseline and stable Postop Assessment: no headache, no backache and no apparent nausea or vomiting Anesthetic complications: no   No notable events documented.  Last Vitals:  Vitals:   07/25/23 1315 07/25/23 1330  BP: 128/78 120/79  Pulse: 68 68  Resp: 13 13  Temp:    SpO2: 100% 100%    Last Pain:  Vitals:   07/25/23 1330  TempSrc:   PainSc: Asleep                 Collene Schlichter

## 2023-07-25 NOTE — ED Notes (Signed)
ED TO INPATIENT HANDOFF REPORT  ED Nurse Name and Phone #: Phat Dalton N.  S Name/Age/Gender [name] 47 y.o. female Room/Bed: WA15/WA15  Code Status   Code Status: Full Code  Home/SNF/Other Home Patient oriented to: self, place, time, and situation Is this baseline? Yes   Triage Complete: Triage complete  Chief Complaint Upper GI bleed [K92.2]  Triage Note Pt arrived POV d/t Hematemesis approz 2 hours - copious amount bright red with clots.  Pt states she has hx of ulcers and noticed some in her stool as well but not much.  Pt denies taking anti-coags.   Allergies Allergies  Allergen Reactions   Ciprofloxacin Itching and Rash   Coconut (Cocos Nucifera) Anaphylaxis and Itching   Coconut Oil Anaphylaxis, Itching and Other (See Comments)   Morphine And Codeine Anaphylaxis, Hives and Other (See Comments)    Pt states that she has tolerated Norco and Dilaudid   Oxycodone Anaphylaxis   Oxycodone-Acetaminophen Anaphylaxis   Doxycycline Nausea And Vomiting and Other (See Comments)   Penicillamine Hives and Other (See Comments)   Trazodone And Nefazodone Rash   Vistaril [Hydroxyzine Hcl] Hives   Silicone    Penicillins Rash    Has patient had a PCN reaction causing immediate rash, facial/tongue/throat swelling, SOB or lightheadedness with hypotension: Yes Has patient had a PCN reaction causing severe rash involving mucus membranes or skin necrosis: No Has patient had a PCN reaction that required hospitalization No Has patient had a PCN reaction occurring within the last 10 years: No If all of the above answers are "NO", then may proceed with Cephalosporin use.   Tape Rash    And paper tape causes a rash if wearing for a prolong period of time    Level of Care/Admitting Diagnosis ED Disposition     ED Disposition  Admit   Condition  --   Comment  Hospital Area: [facility] [[id number]]  Level of Care: Progressive [102]  Admit to Progressive  based on following criteria: GI, ENDOCRINE disease patients with GI bleeding, acute liver failure or pancreatitis, stable with diabetic ketoacidosis or thyrotoxicosis (hypothyroid) state.  May place patient in observation at [facility] or [facility] if equivalent level of care is available:: No  Covid Evaluation: Asymptomatic - no recent exposure (last 10 days) testing not required  Diagnosis: Upper GI bleed [267195]  Admitting Physician: Maryln Gottron [[id number]]  Attending Physician: Kirby Crigler, Parks Neptune [[id number]]          B Medical/Surgery History Past Medical History:  Diagnosis Date   Anal fissure - posterior 10/16/2014    OCC  ISSUES   Anxiety    doesn't take anything   Asthma    has Albuterol inhaler as needed   Boil    on pubic area started septra 03-01-17 draining blood and pus   Bronchitis    Chronic headache disorder 07/22/2016   Clostridium difficile infection 04/20/2012   Colitis    Dehydration    Depression    Family history of adverse reaction to anesthesia    pt mom gets sick   GERD (gastroesophageal reflux disease)    takes Pantoprazole daily   Headache    Hiatal hernia    neuropathy - mild in arms and legs   History of blood transfusion    last transfusion was 04/04/2016=Benadryl was given d/t itching. States she always itches with transfusion.    History of bronchitis    > 2 yrs ago   History  of colon polyps    benign   History of migraine    last one 05/01/16   History of stomach ulcers    History of urinary tract infection LAST 2 WEEKS AGO   IDA (iron deficiency anemia)    Internal and external bleeding hemorrhoids 06/11/2014   Joint pain    Joint swelling    Left sided chronic colitis - segmental 06/11/2014   Marginal ulcer 10/25/2017   Migraine    Motion sickness    Nausea    takes Zofran as needed   Nausea and vomiting    for 1 year   Obesity    Oligouria    Osteoarthritis    lower back, knees, wrists - no meds   Peripheral neuropathy  03/01/2019   Pneumonia 1997   Postoperative nausea and vomiting 01/21/2016   wants scopolamine patch   Right carpal tunnel syndrome 03/01/2019   SVD (spontaneous vaginal delivery)    x 4   Tingling    BOTH LOWER EXTREMETIES ALL THE TIME DUE TO RAPID WEIGHT LOSS   TOBACCO USER 10/02/2009   Qualifier: Diagnosis of  By: Carolyne Fiscal MD, Clayburn Pert     Transfusion history    last transfusion 6'16    Tremor 08/31/2018   UC (ulcerative colitis) (HCC)    supposed to be taking Lialda and Bentyl but has been off since gastric sleeve   Vertigo    doesn't take any meds   Past Surgical History:  Procedure Laterality Date   ABDOMINAL HYSTERECTOMY     PARTIAL   BIOPSY  01/20/2023   Procedure: BIOPSY;  Surgeon: Kerin Salen, MD;  Location: [facility];  Service: Gastroenterology;;   COLONOSCOPY  2007   for rectal bleeding; [facility]   COLONOSCOPY N/A 06/11/2014   Procedure: COLONOSCOPY;  Surgeon: Iva Boop, MD;  Location: [facility];  Service: Endoscopy;  Laterality: N/A;   COLONOSCOPY WITH PROPOFOL N/A 03/02/2017   Procedure: COLONOSCOPY WITH PROPOFOL;  Surgeon: Ovidio Kin, MD;  Location: [facility];  Service: General;  Laterality: N/A;   DILATION AND CURETTAGE OF UTERUS     ESOPHAGOGASTRODUODENOSCOPY (EGD) WITH PROPOFOL N/A 04/06/2016   Procedure: ESOPHAGOGASTRODUODENOSCOPY (EGD) WITH PROPOFOL;  Surgeon: Ovidio Kin, MD;  Location: [facility];  Service: General;  Laterality: N/A;   ESOPHAGOGASTRODUODENOSCOPY (EGD) WITH PROPOFOL N/A 03/02/2017   Procedure: ESOPHAGOGASTRODUODENOSCOPY (EGD) WITH PROPOFOL;  Surgeon: Ovidio Kin, MD;  Location: [facility];  Service: General;  Laterality: N/A;   ESOPHAGOGASTRODUODENOSCOPY (EGD) WITH PROPOFOL N/A 05/20/2017   Procedure: ESOPHAGOGASTRODUODENOSCOPY (EGD) WITH PROPOFOL ERAS PATHWAY;  Surgeon: Ovidio Kin, MD;  Location: [facility];  Service: General;  Laterality: N/A;   ESOPHAGOGASTRODUODENOSCOPY (EGD) WITH PROPOFOL N/A 10/07/2017   Procedure:  ESOPHAGOGASTRODUODENOSCOPY (EGD) WITH PROPOFOL;  Surgeon: Ovidio Kin, MD;  Location: [facility];  Service: General;  Laterality: N/A;   ESOPHAGOGASTRODUODENOSCOPY (EGD) WITH PROPOFOL N/A 01/20/2023   Procedure: ESOPHAGOGASTRODUODENOSCOPY (EGD) WITH PROPOFOL;  Surgeon: Kerin Salen, MD;  Location: [facility];  Service: Gastroenterology;  Laterality: N/A;   EXCISION OF SKIN TAG  06/08/2017   Procedure: EXCISION OF VULVAR SKIN TAGS X2;  Surgeon: Reva Bores, MD;  Location: [facility];  Service: Gynecology;;   FOOT SURGERY Bilateral    x 2   GASTRIC ROUX-EN-Y N/A 12/14/2016   Procedure: LAPAROSCOPIC REVISION SLEEVE GASTRECTOMY TO  ROUX-Y-GASTRIC BY-PASS, UPPER ENDO;  Surgeon: Glenna Fellows, MD;  Location: [facility];  Service: General;  Laterality: N/A;   GASTROJEJUNOSTOMY N/A 05/11/2016   Procedure: LAPAROSCOPIC PLACEMENT  OF  FEEDING  JEJUNOSTOMY TUBE;  Surgeon: Glenna Fellows, MD;  Location: [facility];  Service: General;  Laterality: N/A;   HEMORRHOIDECTOMY WITH HEMORRHOID BANDING     IR FLUORO GUIDE CV LINE RIGHT  04/06/2017   IR GENERIC HISTORICAL  05/19/2016   IR CM INJ ANY COLONIC TUBE W/FLUORO 05/19/2016 Irish Lack, MD [facility]   IR PATIENT EVAL TECH 0-60 MINS  11/30/2017   IR REPLC DUODEN/JEJUNO TUBE PERCUT W/FLUORO  03/14/2018   IR REPLC DUODEN/JEJUNO TUBE PERCUT W/FLUORO  03/15/2018   IR US GUIDE VASC ACCESS RIGHT  04/06/2017   j tube removed march 2018     KNEE ARTHROSCOPY Left 09/06/2014   LAPAROSCOPIC GASTRIC SLEEVE RESECTION N/A 01/14/2016   Procedure: LAPAROSCOPIC GASTRIC SLEEVE RESECTION;  Surgeon: Glenna Fellows, MD;  Location: [facility];  Service: General;  Laterality: N/A;   LAPAROSCOPIC REVISION OF GASTROJEJUNOSTOMY N/A 10/25/2017   Procedure: LAPAROSCOPIC REVISION OF GASTROJEJUNOSTOMY AND PARTIAL GASTRECTOMY, WITH PLACEMENT OF FEEDING GASTROSTOMY TUBE;  Surgeon: Glenna Fellows, MD;  Location: [facility];  Service: General;  Laterality: N/A;   LAPAROSCOPIC TUBAL LIGATION   10/16/2011   Procedure: LAPAROSCOPIC TUBAL LIGATION;  Surgeon: Esmeralda Arthur, MD;  Location: [facility];  Service: Gynecology;  Laterality: Bilateral;   NOVASURE ABLATION  09/28/2010   mild persistent vaginal bleeding   right knee arthroscopy     05/07/2016   TOTAL KNEE ARTHROPLASTY Left 03/10/2018   Procedure: LEFT TOTAL KNEE ARTHROPLASTY;  Surgeon: Durene Romans, MD;  Location: [facility];  Service: Orthopedics;  Laterality: Left;  70 mins   TOTAL KNEE ARTHROPLASTY Right 01/23/2020   Procedure: TOTAL KNEE ARTHROPLASTY, BILATERAL TROCHANTERIC INJECTION;  Surgeon: Durene Romans, MD;  Location: [facility];  Service: Orthopedics;  Laterality: Right;  70 mins for Bilateral Troch injection 1 cc Depo and 2 CC Lidocaine   TUBAL LIGATION     VAGINAL HYSTERECTOMY Bilateral 06/08/2017   Procedure: HYSTERECTOMY VAGINAL W/ BILATERAL SALPINGECTOMY;  Surgeon: Reva Bores, MD;  Location: [facility];  Service: Gynecology;  Laterality: Bilateral;   wisdom teeth extracted       A IV Location/Drains/Wounds Patient Lines/Drains/Airways Status     Active Line/Drains/Airways     Name Placement date Placement time Site Days   Peripheral IV 07/25/23 20 G 1" Anterior;Proximal;Right Forearm 07/25/23  0308  Forearm  less than 1            Intake/Output Last 24 hours No intake or output data in the 24 hours ending 07/25/23 1021  Labs/Imaging Results for orders placed or performed during the hospital encounter of 07/25/23 (from the past 48 hour(s))  Lipase, blood     Status: None   Collection Time: 07/25/23  3:15 AM  Result Value Ref Range   Lipase 30 11 - 51 U/L    Comment: Performed at [facility], [address], [place], [place] [address]  Comprehensive metabolic panel     Status: Abnormal   Collection Time: 07/25/23  3:15 AM  Result Value Ref Range   Sodium 141 135 - 145 mmol/L   Potassium 3.6 3.5 - 5.1 mmol/L   Chloride 106 98 - 111 mmol/L   CO2 27 22 - 32 mmol/L   Glucose, Bld 118 (H)  70 - 99 mg/dL    Comment: Glucose reference range applies only to samples taken after fasting for at least 8 hours.   BUN 25 (H) 6 - 20 mg/dL   Creatinine, Ser 1.91 0.44 - 1.00 mg/dL   Calcium  9.4 8.9 - 10.3 mg/dL   Total Protein 7.2 6.5 - 8.1 g/dL   Albumin 3.6 3.5 - 5.0 g/dL   AST 15 15 - 41 U/L   ALT 17 0 - 44 U/L   Alkaline Phosphatase 67 38 - 126 U/L   Total Bilirubin 0.6 0.3 - 1.2 mg/dL   GFR, Estimated >11 >91 mL/min    Comment: (NOTE) Calculated using the CKD-EPI Creatinine Equation (2021)    Anion gap 8 5 - 15    Comment: Performed at [facility], [address], [place], [place] [address]  CBC     Status: Abnormal   Collection Time: 07/25/23  3:15 AM  Result Value Ref Range   WBC 10.9 (H) 4.0 - 10.5 K/uL   RBC 4.19 3.87 - 5.11 MIL/uL   Hemoglobin 12.1 12.0 - 15.0 g/dL   HCT 56.2 13.0 - 86.5 %   MCV 92.1 80.0 - 100.0 fL   MCH 28.9 26.0 - 34.0 pg   MCHC 31.3 30.0 - 36.0 g/dL   RDW 78.4 (H) 69.6 - 29.5 %   Platelets 405 (H) 150 - 400 K/uL   nRBC 0.0 0.0 - 0.2 %    Comment: Performed at [facility], [address], [place], [place] [address]  Urinalysis, Routine w reflex microscopic -Urine, Clean Catch     Status: Abnormal   Collection Time: 07/25/23  3:15 AM  Result Value Ref Range   Color, Urine AMBER (A) YELLOW    Comment: BIOCHEMICALS MAY BE AFFECTED BY COLOR   APPearance CLOUDY (A) CLEAR   Specific Gravity, Urine 1.036 (H) 1.005 - 1.030   pH 5.0 5.0 - 8.0   Glucose, UA NEGATIVE NEGATIVE mg/dL   Hgb urine dipstick NEGATIVE NEGATIVE   Bilirubin Urine SMALL (A) NEGATIVE   Ketones, ur 5 (A) NEGATIVE mg/dL   Protein, ur 244 (A) NEGATIVE mg/dL   Nitrite NEGATIVE NEGATIVE   Leukocytes,Ua SMALL (A) NEGATIVE   RBC / HPF 0-5 0 - 5 RBC/hpf   WBC, UA 11-20 0 - 5 WBC/hpf   Bacteria, UA NONE SEEN NONE SEEN   Squamous Epithelial / HPF >50 0 - 5 /HPF   Mucus PRESENT    Hyaline Casts, UA PRESENT     Comment: Performed at [facility], [address], [place], [place] [address]  Type and screen [facility]     Status: None   Collection Time: 07/25/23  3:35 AM  Result Value Ref Range   ABO/RH(D) O POS    Antibody Screen NEG    Sample Expiration      07/28/2023,2359 Performed at [facility], [address] [place], [place] [address]   Hemoglobin and hematocrit, blood     Status: Abnormal   Collection Time: 07/25/23  8:40 AM  Result Value Ref Range   Hemoglobin 9.8 (L) 12.0 - 15.0 g/dL   HCT 44.0 (L) 34.7 - 42.5 %    Comment: Performed at [facility], [address] [place], [place] [address]   CT ANGIO ABDOMEN PELVIS  W & WO CONTRAST  Result Date: 07/25/2023 CLINICAL DATA:  Lower GI bleed. Hematemesis with bright red clots and some blood in stool. History of ulcers. EXAM: CTA ABDOMEN AND PELVIS WITHOUT AND WITH CONTRAST TECHNIQUE: Multidetector CT imaging of the abdomen and pelvis was performed using the standard protocol during bolus administration of intravenous contrast. Multiplanar reconstructed images and MIPs were obtained and  reviewed to evaluate the vascular anatomy. RADIATION DOSE REDUCTION: This exam was performed according to the departmental dose-optimization program which includes automated exposure control, adjustment of the mA and/or kV according to patient size and/or use of iterative reconstruction technique. CONTRAST:  OMNIPAQUE IOHEXOL 350 MG/ML SOLN COMPARISON:  01/25/2023. FINDINGS: VASCULAR Aorta: Normal caliber aorta without aneurysm, dissection, vasculitis or significant stenosis. Celiac: Patent without evidence of aneurysm, dissection, vasculitis or significant stenosis. Left hepatic artery originates from the aorta. SMA: Patent without evidence of aneurysm, dissection, vasculitis or significant stenosis. Right hepatic artery originates on the SMA. Renals: Both renal arteries are patent without evidence of  aneurysm, dissection, vasculitis, fibromuscular dysplasia or significant stenosis. IMA: Patent without evidence of aneurysm, dissection, vasculitis or significant stenosis. Inflow: Patent without evidence of aneurysm, dissection, vasculitis or significant stenosis. Proximal Outflow: Bilateral common femoral and visualized portions of the superficial and profunda femoral arteries are patent without evidence of aneurysm, dissection, vasculitis or significant stenosis. Veins: No obvious venous abnormality within the limitations of this arterial phase study. Review of the MIP images confirms the above findings. NON-VASCULAR Lower chest: The heart is normal in size and there is a small pericardial effusion. The lung bases are clear. Hepatobiliary: No focal liver abnormality is seen. No gallstones, gallbladder wall thickening, or biliary dilatation. Pancreas: Unremarkable. No pancreatic ductal dilatation or surrounding inflammatory changes. Spleen: Normal size. A 2.0 cm hypodensity is noted in the spleen, likely cyst. Adrenals/Urinary Tract: The adrenal glands are within normal limits. No renal calculus or hydronephrosis bilaterally. The bladder is unremarkable. Stomach/Bowel: There is a small hiatal hernia with thickening and edema of the walls of the distal esophagus. Gastric bypass surgical changes are present. Appendix appears normal. There is bowel wall thickening with surrounding edema involving extending from the gastrojejunostomy anastomosis. No bowel obstruction, free air, or pneumatosis. No acute hemorrhage is seen. Lymphatic: No significant vascular findings are present. No enlarged abdominal or pelvic lymph nodes. Reproductive: Status post hysterectomy. No adnexal masses. Other: No abdominopelvic ascites. Musculoskeletal: A left sacral neurostimulator device is noted. 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 Degenerative changes are noted in the thoracolumbar spine. No acute osseous abnormality.  IMPRESSION: VASCULAR No evidence of acute or active hemorrhage. NON-VASCULAR 1. Status post gastric bypass with bowel wall thickening and edema at the gastrojejunostomy anastomosis, which may be infectious or inflammatory. 2. Small hiatal hernia with mild thickening of the walls of the distal esophagus with associated edema, suggesting esophagitis. Electronically Signed   By: Thornell Sartorius M.D.   On: 07/25/2023 04:39    Pending Labs Unresulted Labs (From admission, onward)     Start     Ordered   07/26/23 0500  Basic metabolic panel  Tomorrow morning,   R        07/25/23 0743   07/26/23 0500  CBC  Tomorrow morning,   R        07/25/23 0743   07/25/23 0744  Hemoglobin and hematocrit, blood  Now then every 8 hours,   R      07/25/23 0743   07/25/23 0320  Protime-INR  Once,   STAT        07/25/23 0319            Vitals/Pain Today's Vitals   07/25/23 0815 07/25/23 0845 07/25/23 0915 07/25/23 1019  BP: 112/84 113/81 108/79 117/76  Pulse: 79 76 78 82  Resp:      Temp:      TempSrc:  SpO2: 100% 100% 100% 100%  Weight:      Height:      PainSc:        Isolation Precautions No active isolations  Medications Medications  SUMAtriptan (IMITREX) tablet 50 mg (has no administration in time range)  atorvastatin (LIPITOR) tablet 40 mg (has no administration in time range)  mirtazapine (REMERON) tablet 15 mg (has no administration in time range)  clonazePAM (KLONOPIN) tablet 1 mg (has no administration in time range)  fluticasone (FLONASE) 50 MCG/ACT nasal spray 2 spray (has no administration in time range)  ondansetron (ZOFRAN) tablet 4 mg ( Oral See Alternative 07/25/23 0943)    Or  ondansetron (ZOFRAN) injection 4 mg (4 mg Intravenous Given 07/25/23 0943)  albuterol (PROVENTIL) (2.5 MG/3ML) 0.083% nebulizer solution 2.5 mg (has no administration in time range)  Atogepant TABS 30 mg (has no administration in time range)  buPROPion (WELLBUTRIN XL) 24 hr tablet 450 mg (has no  administration in time range)  busPIRone (BUSPAR) tablet 15 mg (has no administration in time range)  DULoxetine (CYMBALTA) DR capsule 60 mg (has no administration in time range)  linaclotide (LINZESS) capsule 290 mcg (has no administration in time range)  dicyclomine (BENTYL) capsule 10 mg (10 mg Oral Given 07/25/23 0943)  mesalamine (LIALDA) EC tablet 2.4 g (has no administration in time range)  sucralfate (CARAFATE) tablet 1 g (has no administration in time range)  pantoprazole (PROTONIX) injection 40 mg (has no administration in time range)  metoCLOPramide (REGLAN) injection 5 mg (has no administration in time range)  HYDROmorphone (DILAUDID) injection 0.5 mg (has no administration in time range)  ondansetron (ZOFRAN-ODT) disintegrating tablet 4 mg (4 mg Oral Given 07/25/23 0448)  fentaNYL (SUBLIMAZE) injection 50 mcg (50 mcg Intravenous Given 07/25/23 0339)  ondansetron (ZOFRAN) injection 4 mg (4 mg Intravenous Given 07/25/23 0338)  iohexol (OMNIPAQUE) 350 MG/ML injection 100 mL (100 mLs Intravenous Contrast Given 07/25/23 0402)  pantoprazole (PROTONIX) injection 40 mg (40 mg Intravenous Given 07/25/23 0448)  HYDROmorphone (DILAUDID) injection 0.5 mg (0.5 mg Intravenous Given 07/25/23 0517)  lactated ringers bolus 1,000 mL (0 mLs Intravenous Stopped 07/25/23 0700)    Mobility walks     Focused Assessments Gastro- vomiting blood    R Recommendations: See Admitting Provider Note  Report given to:   Additional Notes: Endo taking pt from ED then bringing up to inpatient room after procedure Pt is ambulatory, RA, Aox4 S.L. IV

## 2023-07-25 NOTE — Interval H&P Note (Signed)
History and Physical Interval Note:  07/25/2023 10:54 AM  [name]  has presented today for surgery, with the diagnosis of Hematemesis.  The various methods of treatment have been discussed with the patient and family. After consideration of risks, benefits and other options for treatment, the patient has consented to  Procedure(s): ESOPHAGOGASTRODUODENOSCOPY (EGD) (N/A) as a surgical intervention.  The patient's history has been reviewed, patient examined, no change in status, stable for surgery.  I have reviewed the patient's chart and labs.  Questions were answered to the patient's satisfaction.     Lynann Bologna

## 2023-07-25 NOTE — Transfer of Care (Addendum)
Immediate Anesthesia Transfer of Care Note  Patient: [name]  Procedure(s) Performed: ESOPHAGOGASTRODUODENOSCOPY (EGD) HOT HEMOSTASIS (ARGON PLASMA COAGULATION/BICAP) HEMOSTASIS CLIP PLACEMENT  Patient Location: PACU  Anesthesia Type:MAC  Level of Consciousness: awake and patient cooperative  Airway & Oxygen Therapy: Patient Spontanous Breathing and Patient connected to nasal cannula oxygen  Post-op Assessment: Report given to RN and Post -op Vital signs reviewed and stable  Post vital signs: Reviewed and stable  Last Vitals:  Vitals Value Taken Time  BP 119/78 07/25/23   1240  Temp 36.2 10/*27/24  1240  Pulse 72 07/25/23 1240  Resp 17 07/25/23   1240  SpO2 100 % 07/25/23 1240  Vitals shown include unfiled device data.  Last Pain:  Vitals:   07/25/23 1049  TempSrc: Temporal  PainSc: 0-No pain         Complications: No notable events documented.

## 2023-07-25 NOTE — Consult Note (Signed)
Reason for Consult: GI bleeding Referring Physician: Kirby Crigler MD  [name] is an 47 y.o. female.  HPI: 47 year old female with a history of Roux-en-Y gastric bypass 2019 by Dr. Glenna Fellows.  She previously had a gastric sleeve that was revised to a Roux-en-Y gastric bypass.  She has had issues with revision as well having the previous gastric bypass revised back in 2019.  This is for marginal ulceration.  She is a nondrinker, non-smoker and does not use NSAIDs.  She has developed another marginal ulcer causing upper GI bleeding which was for admission to the hospital.  Endoscopy revealed a bleeding ulcer at what sounds like her GJ.  This was described as a Billroth I in her procedure note which  is not her anatomy.  She has a gastric pouch and a GJ anastomosis.  Hemoglobin is 9.9.  She has had a longstanding history of ulcers and previous endoscopy back in August and back in April.  Her marginal ulcer has been present is not healing with medical management.  Past Medical History:  Diagnosis Date   Anal fissure - posterior 10/16/2014    OCC  ISSUES   Anxiety    doesn't take anything   Asthma    has Albuterol inhaler as needed   Boil    on pubic area started septra 03-01-17 draining blood and pus   Bronchitis    Chronic headache disorder 07/22/2016   Clostridium difficile infection 04/20/2012   Colitis    Dehydration    Depression    Family history of adverse reaction to anesthesia    pt mom gets sick   GERD (gastroesophageal reflux disease)    takes Pantoprazole daily   Headache    Hiatal hernia    neuropathy - mild in arms and legs   History of blood transfusion    last transfusion was 04/04/2016=Benadryl was given d/t itching. States she always itches with transfusion.    History of bronchitis    > 2 yrs ago   History of colon polyps    benign   History of migraine    last one 05/01/16   History of stomach ulcers    History of urinary tract infection LAST 2 WEEKS AGO    IDA (iron deficiency anemia)    Internal and external bleeding hemorrhoids 06/11/2014   Joint pain    Joint swelling    Left sided chronic colitis - segmental 06/11/2014   Marginal ulcer 10/25/2017   Migraine    Motion sickness    Nausea    takes Zofran as needed   Nausea and vomiting    for 1 year   Obesity    Oligouria    Osteoarthritis    lower back, knees, wrists - no meds   Peripheral neuropathy 03/01/2019   Pneumonia 1997   Postoperative nausea and vomiting 01/21/2016   wants scopolamine patch   Right carpal tunnel syndrome 03/01/2019   SVD (spontaneous vaginal delivery)    x 4   Tingling    BOTH LOWER EXTREMETIES ALL THE TIME DUE TO RAPID WEIGHT LOSS   TOBACCO USER 10/02/2009   Qualifier: Diagnosis of  By: Carolyne Fiscal MD, Clayburn Pert     Transfusion history    last transfusion 6'16    Tremor 08/31/2018   UC (ulcerative colitis) (HCC)    supposed to be taking Lialda and Bentyl but has been off since gastric sleeve   Vertigo    doesn't take any meds    Past Surgical  History:  Procedure Laterality Date   ABDOMINAL HYSTERECTOMY     PARTIAL   BIOPSY  01/20/2023   Procedure: BIOPSY;  Surgeon: Kerin Salen, MD;  Location: [facility];  Service: Gastroenterology;;   COLONOSCOPY  2007   for rectal bleeding; [facility]   COLONOSCOPY N/A 06/11/2014   Procedure: COLONOSCOPY;  Surgeon: Iva Boop, MD;  Location: [facility];  Service: Endoscopy;  Laterality: N/A;   COLONOSCOPY WITH PROPOFOL N/A 03/02/2017   Procedure: COLONOSCOPY WITH PROPOFOL;  Surgeon: Ovidio Kin, MD;  Location: [facility];  Service: General;  Laterality: N/A;   DILATION AND CURETTAGE OF UTERUS     ESOPHAGOGASTRODUODENOSCOPY (EGD) WITH PROPOFOL N/A 04/06/2016   Procedure: ESOPHAGOGASTRODUODENOSCOPY (EGD) WITH PROPOFOL;  Surgeon: Ovidio Kin, MD;  Location: [facility];  Service: General;  Laterality: N/A;   ESOPHAGOGASTRODUODENOSCOPY (EGD) WITH PROPOFOL N/A 03/02/2017   Procedure: ESOPHAGOGASTRODUODENOSCOPY (EGD) WITH  PROPOFOL;  Surgeon: Ovidio Kin, MD;  Location: [facility];  Service: General;  Laterality: N/A;   ESOPHAGOGASTRODUODENOSCOPY (EGD) WITH PROPOFOL N/A 05/20/2017   Procedure: ESOPHAGOGASTRODUODENOSCOPY (EGD) WITH PROPOFOL ERAS PATHWAY;  Surgeon: Ovidio Kin, MD;  Location: [facility];  Service: General;  Laterality: N/A;   ESOPHAGOGASTRODUODENOSCOPY (EGD) WITH PROPOFOL N/A 10/07/2017   Procedure: ESOPHAGOGASTRODUODENOSCOPY (EGD) WITH PROPOFOL;  Surgeon: Ovidio Kin, MD;  Location: [facility];  Service: General;  Laterality: N/A;   ESOPHAGOGASTRODUODENOSCOPY (EGD) WITH PROPOFOL N/A 01/20/2023   Procedure: ESOPHAGOGASTRODUODENOSCOPY (EGD) WITH PROPOFOL;  Surgeon: Kerin Salen, MD;  Location: [facility];  Service: Gastroenterology;  Laterality: N/A;   EXCISION OF SKIN TAG  06/08/2017   Procedure: EXCISION OF VULVAR SKIN TAGS X2;  Surgeon: Reva Bores, MD;  Location: [facility];  Service: Gynecology;;   FOOT SURGERY Bilateral    x 2   GASTRIC ROUX-EN-Y N/A 12/14/2016   Procedure: LAPAROSCOPIC REVISION SLEEVE GASTRECTOMY TO  ROUX-Y-GASTRIC BY-PASS, UPPER ENDO;  Surgeon: Glenna Fellows, MD;  Location: [facility];  Service: General;  Laterality: N/A;   GASTROJEJUNOSTOMY N/A 05/11/2016   Procedure: LAPAROSCOPIC PLACEMENT  OF FEEDING  JEJUNOSTOMY TUBE;  Surgeon: Glenna Fellows, MD;  Location: [facility];  Service: General;  Laterality: N/A;   HEMORRHOIDECTOMY WITH HEMORRHOID BANDING     IR FLUORO GUIDE CV LINE RIGHT  04/06/2017   IR GENERIC HISTORICAL  05/19/2016   IR CM INJ ANY COLONIC TUBE W/FLUORO 05/19/2016 Irish Lack, MD [facility]   IR PATIENT EVAL TECH 0-60 MINS  11/30/2017   IR REPLC DUODEN/JEJUNO TUBE PERCUT W/FLUORO  03/14/2018   IR REPLC DUODEN/JEJUNO TUBE PERCUT W/FLUORO  03/15/2018   IR US GUIDE VASC ACCESS RIGHT  04/06/2017   j tube removed march 2018     KNEE ARTHROSCOPY Left 09/06/2014   LAPAROSCOPIC GASTRIC SLEEVE RESECTION N/A 01/14/2016   Procedure: LAPAROSCOPIC GASTRIC SLEEVE  RESECTION;  Surgeon: Glenna Fellows, MD;  Location: [facility];  Service: General;  Laterality: N/A;   LAPAROSCOPIC REVISION OF GASTROJEJUNOSTOMY N/A 10/25/2017   Procedure: LAPAROSCOPIC REVISION OF GASTROJEJUNOSTOMY AND PARTIAL GASTRECTOMY, WITH PLACEMENT OF FEEDING GASTROSTOMY TUBE;  Surgeon: Glenna Fellows, MD;  Location: [facility];  Service: General;  Laterality: N/A;   LAPAROSCOPIC TUBAL LIGATION  10/16/2011   Procedure: LAPAROSCOPIC TUBAL LIGATION;  Surgeon: Esmeralda Arthur, MD;  Location: [facility];  Service: Gynecology;  Laterality: Bilateral;   NOVASURE ABLATION  09/28/2010   mild persistent vaginal bleeding   right knee arthroscopy     05/07/2016   TOTAL KNEE ARTHROPLASTY Left 03/10/2018   Procedure: LEFT TOTAL KNEE ARTHROPLASTY;  Surgeon: Charlann Boxer,  Molli Hazard, MD;  Location: [facility];  Service: Orthopedics;  Laterality: Left;  70 mins   TOTAL KNEE ARTHROPLASTY Right 01/23/2020   Procedure: TOTAL KNEE ARTHROPLASTY, BILATERAL TROCHANTERIC INJECTION;  Surgeon: Durene Romans, MD;  Location: [facility];  Service: Orthopedics;  Laterality: Right;  70 mins for Bilateral Troch injection 1 cc Depo and 2 CC Lidocaine   TUBAL LIGATION     VAGINAL HYSTERECTOMY Bilateral 06/08/2017   Procedure: HYSTERECTOMY VAGINAL W/ BILATERAL SALPINGECTOMY;  Surgeon: Reva Bores, MD;  Location: [facility];  Service: Gynecology;  Laterality: Bilateral;   wisdom teeth extracted      Family History  Problem Relation Age of Onset   Hypertension Mother    Diabetes Mother    Sarcoidosis Mother        lungs and skin   Asthma Mother    Hypertension Father    Diabetes Father    Asthma Son    Tics Son    Asthma Sister    Cancer Sister        possible pancreatic cancer   Adrenal disorder Sister        Tumor    Asthma Brother    Asthma Daughter        died age 19.5   Cancer Daughter 4       brain; died age 19.5   Asthma Son    Cancer Paternal Aunt        brain, colon, lung and esophagus; unsure of primary    Breast cancer  Paternal Aunt     Social History:  reports that she quit smoking about 9 years ago. Her smoking use included cigarettes. She started smoking about 29 years ago. She has a 10 pack-year smoking history. She has never used smokeless tobacco. She reports current alcohol use. She reports that she does not use drugs.  Allergies:  Allergies  Allergen Reactions   Ciprofloxacin Itching and Rash   Coconut (Cocos Nucifera) Anaphylaxis and Itching   Coconut Oil Anaphylaxis, Itching and Other (See Comments)   Morphine And Codeine Anaphylaxis, Hives and Other (See Comments)    Pt states that she has tolerated Norco and Dilaudid   Oxycodone Anaphylaxis   Oxycodone-Acetaminophen Anaphylaxis   Doxycycline Nausea And Vomiting and Other (See Comments)   Penicillamine Hives and Other (See Comments)   Trazodone And Nefazodone Rash   Vistaril [Hydroxyzine Hcl] Hives   Silicone    Penicillins Rash    Has patient had a PCN reaction causing immediate rash, facial/tongue/throat swelling, SOB or lightheadedness with hypotension: Yes Has patient had a PCN reaction causing severe rash involving mucus membranes or skin necrosis: No Has patient had a PCN reaction that required hospitalization No Has patient had a PCN reaction occurring within the last 10 years: No If all of the above answers are "NO", then may proceed with Cephalosporin use.   Tape Rash    And paper tape causes a rash if wearing for a prolong period of time    Medications: I have reviewed the patient's current medications.  Results for orders placed or performed during the hospital encounter of 07/25/23 (from the past 48 hour(s))  Lipase, blood     Status: None   Collection Time: 07/25/23  3:15 AM  Result Value Ref Range   Lipase 30 11 - 51 U/L    Comment: Performed at [facility], [address], [place], [place] [address]  Comprehensive metabolic panel     Status: Abnormal   Collection  Time: 07/25/23  3:15 AM  Result  Value Ref Range   Sodium 141 135 - 145 mmol/L   Potassium 3.6 3.5 - 5.1 mmol/L   Chloride 106 98 - 111 mmol/L   CO2 27 22 - 32 mmol/L   Glucose, Bld 118 (H) 70 - 99 mg/dL    Comment: Glucose reference range applies only to samples taken after fasting for at least 8 hours.   BUN 25 (H) 6 - 20 mg/dL   Creatinine, Ser 1.61 0.44 - 1.00 mg/dL   Calcium 9.4 8.9 - 09.6 mg/dL   Total Protein 7.2 6.5 - 8.1 g/dL   Albumin 3.6 3.5 - 5.0 g/dL   AST 15 15 - 41 U/L   ALT 17 0 - 44 U/L   Alkaline Phosphatase 67 38 - 126 U/L   Total Bilirubin 0.6 0.3 - 1.2 mg/dL   GFR, Estimated >04 >54 mL/min    Comment: (NOTE) Calculated using the CKD-EPI Creatinine Equation (2021)    Anion gap 8 5 - 15    Comment: Performed at [facility], [address], [place], [place] [address]  CBC     Status: Abnormal   Collection Time: 07/25/23  3:15 AM  Result Value Ref Range   WBC 10.9 (H) 4.0 - 10.5 K/uL   RBC 4.19 3.87 - 5.11 MIL/uL   Hemoglobin 12.1 12.0 - 15.0 g/dL   HCT 91.4 78.2 - 95.6 %   MCV 92.1 80.0 - 100.0 fL   MCH 28.9 26.0 - 34.0 pg   MCHC 31.3 30.0 - 36.0 g/dL   RDW 21.3 (H) 08.6 - 57.8 %   Platelets 405 (H) 150 - 400 K/uL   nRBC 0.0 0.0 - 0.2 %    Comment: Performed at [facility], [address] [place], [place] [address]  Urinalysis, Routine w reflex microscopic -Urine, Clean Catch     Status: Abnormal   Collection Time: 07/25/23  3:15 AM  Result Value Ref Range   Color, Urine AMBER (A) YELLOW    Comment: BIOCHEMICALS MAY BE AFFECTED BY COLOR   APPearance CLOUDY (A) CLEAR   Specific Gravity, Urine 1.036 (H) 1.005 - 1.030   pH 5.0 5.0 - 8.0   Glucose, UA NEGATIVE NEGATIVE mg/dL   Hgb urine dipstick NEGATIVE NEGATIVE   Bilirubin Urine SMALL (A) NEGATIVE   Ketones, ur 5 (A) NEGATIVE mg/dL   Protein, ur 952 (A) NEGATIVE mg/dL   Nitrite NEGATIVE NEGATIVE   Leukocytes,Ua SMALL (A) NEGATIVE   RBC / HPF 0-5 0 - 5 RBC/hpf   WBC, UA 11-20 0 - 5 WBC/hpf    Bacteria, UA NONE SEEN NONE SEEN   Squamous Epithelial / HPF >50 0 - 5 /HPF   Mucus PRESENT    Hyaline Casts, UA PRESENT     Comment: Performed at [facility], [address] [place], [place] [address]  Type and screen [facility]     Status: None   Collection Time: 07/25/23  3:35 AM  Result Value Ref Range   ABO/RH(D) O POS    Antibody Screen NEG    Sample Expiration      07/28/2023,2359 Performed at [facility], [address] [place], [place] [address]   Hemoglobin and hematocrit, blood     Status: Abnormal   Collection Time: 07/25/23  8:40 AM  Result Value Ref Range   Hemoglobin 9.8 (L) 12.0 - 15.0 g/dL   HCT 27.2 (L)  36.0 - 46.0 %    Comment: Performed at [facility], [address] [place], [place] [address]  Hemoglobin and hematocrit, blood     Status: Abnormal   Collection Time: 07/25/23  2:24 PM  Result Value Ref Range   Hemoglobin 9.9 (L) 12.0 - 15.0 g/dL   HCT 62.1 (L) 30.8 - 65.7 %    Comment: Performed at [facility], [address], [place], [place] [address]    CT ANGIO ABDOMEN PELVIS  W & WO CONTRAST  Result Date: 07/25/2023 CLINICAL DATA:  Lower GI bleed. Hematemesis with bright red clots and some blood in stool. History of ulcers. EXAM: CTA ABDOMEN AND PELVIS WITHOUT AND WITH CONTRAST TECHNIQUE: Multidetector CT imaging of the abdomen and pelvis was performed using the standard protocol during bolus administration of intravenous contrast. Multiplanar reconstructed images and MIPs were obtained and reviewed to evaluate the vascular anatomy. RADIATION DOSE REDUCTION: This exam was performed according to the departmental dose-optimization program which includes automated exposure control, adjustment of the mA and/or kV according to patient size and/or use of iterative reconstruction technique. CONTRAST:  OMNIPAQUE IOHEXOL 350 MG/ML SOLN COMPARISON:  01/25/2023.  FINDINGS: VASCULAR Aorta: Normal caliber aorta without aneurysm, dissection, vasculitis or significant stenosis. Celiac: Patent without evidence of aneurysm, dissection, vasculitis or significant stenosis. Left hepatic artery originates from the aorta. SMA: Patent without evidence of aneurysm, dissection, vasculitis or significant stenosis. Right hepatic artery originates on the SMA. Renals: Both renal arteries are patent without evidence of aneurysm, dissection, vasculitis, fibromuscular dysplasia or significant stenosis. IMA: Patent without evidence of aneurysm, dissection, vasculitis or significant stenosis. Inflow: Patent without evidence of aneurysm, dissection, vasculitis or significant stenosis. Proximal Outflow: Bilateral common femoral and visualized portions of the superficial and profunda femoral arteries are patent without evidence of aneurysm, dissection, vasculitis or significant stenosis. Veins: No obvious venous abnormality within the limitations of this arterial phase study. Review of the MIP images confirms the above findings. NON-VASCULAR Lower chest: The heart is normal in size and there is a small pericardial effusion. The lung bases are clear. Hepatobiliary: No focal liver abnormality is seen. No gallstones, gallbladder wall thickening, or biliary dilatation. Pancreas: Unremarkable. No pancreatic ductal dilatation or surrounding inflammatory changes. Spleen: Normal size. A 2.0 cm hypodensity is noted in the spleen, likely cyst. Adrenals/Urinary Tract: The adrenal glands are within normal limits. No renal calculus or hydronephrosis bilaterally. The bladder is unremarkable. Stomach/Bowel: There is a small hiatal hernia with thickening and edema of the walls of the distal esophagus. Gastric bypass surgical changes are present. Appendix appears normal. There is bowel wall thickening with surrounding edema involving extending from the gastrojejunostomy anastomosis. No bowel obstruction, free air, or  pneumatosis. No acute hemorrhage is seen. Lymphatic: No significant vascular findings are present. No enlarged abdominal or pelvic lymph nodes. Reproductive: Status post hysterectomy. No adnexal masses. Other: No abdominopelvic ascites. Musculoskeletal: A left sacral neurostimulator device is noted. 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 Degenerative changes are noted in the thoracolumbar spine. No acute osseous abnormality. IMPRESSION: VASCULAR No evidence of acute or active hemorrhage. NON-VASCULAR 1. Status post gastric bypass with bowel wall thickening and edema at the gastrojejunostomy anastomosis, which may be infectious or inflammatory. 2. Small hiatal hernia with mild thickening of the walls of the distal esophagus with associated edema, suggesting esophagitis. Electronically Signed   By: Thornell Sartorius M.D.   On: 07/25/2023 04:39    Review of Systems Blood pressure  115/65, pulse 69, temperature 99.2 F (37.3 C), temperature source Oral, resp. rate 17, height 5\' 7"  (1.702 m), weight 61.2 kg, last menstrual period 06/02/2017, SpO2 100%. Physical Exam Cardiovascular:     Rate and Rhythm: Normal rate.  Pulmonary:     Effort: Pulmonary effort is normal.  Abdominal:     General: Abdomen is flat. There is no distension.     Palpations: Abdomen is soft.     Tenderness: There is no abdominal tenderness.  Skin:    General: Skin is warm.  Neurological:     General: No focal deficit present.     Mental Status: She is alert.     Assessment/Plan: GI bleeding from marginal ulcer status post Roux-en-Y gastric bypass with revision in 2019  Primary service to round in a.m.  They would not discussed this with the bariatric team to decide on neck steps.  More and likely she will require revision but the question will be timing of this.  She is currently stable this point in time.  Okay to allow liquid from surgery standpoint for now since timing of intervention is unclear until  evaluated by bariatric specialist.  No acute surgical need at this time  Dortha Schwalbe MD [facility]  07/25/2023, 4:37 PM

## 2023-07-25 NOTE — ED Triage Notes (Signed)
Pt arrived POV d/t Hematemesis approz 2 hours - copious amount bright red with clots.  Pt states she has hx of ulcers and noticed some in her stool as well but not much.  Pt denies taking anti-coags.

## 2023-07-26 DIAGNOSIS — K922 Gastrointestinal hemorrhage, unspecified: Secondary | ICD-10-CM | POA: Diagnosis not present

## 2023-07-26 LAB — BASIC METABOLIC PANEL
Anion gap: 6 (ref 5–15)
BUN: 15 mg/dL (ref 6–20)
CO2: 25 mmol/L (ref 22–32)
Calcium: 8.7 mg/dL — ABNORMAL LOW (ref 8.9–10.3)
Chloride: 108 mmol/L (ref 98–111)
Creatinine, Ser: 0.63 mg/dL (ref 0.44–1.00)
GFR, Estimated: >60 mL/min (ref >60)
Glucose, Bld: 73 mg/dL (ref 70–99)
Potassium: 3.7 mmol/L (ref 3.5–5.1)
Sodium: 139 mmol/L (ref 135–145)

## 2023-07-26 LAB — CBC
HCT: 31.1 % — ABNORMAL LOW (ref 36.0–46.0)
Hemoglobin: 9.8 g/dL — ABNORMAL LOW (ref 12.0–15.0)
MCH: 29.6 pg (ref 26.0–34.0)
MCHC: 31.5 g/dL (ref 30.0–36.0)
MCV: 94 fL (ref 80.0–100.0)
Platelets: 280 10*3/uL (ref 150–400)
RBC: 3.31 MIL/uL — ABNORMAL LOW (ref 3.87–5.11)
RDW: 15.9 % — ABNORMAL HIGH (ref 11.5–15.5)
WBC: 8 10*3/uL (ref 4.0–10.5)
nRBC: 0 % (ref 0.0–0.2)

## 2023-07-26 LAB — HEMOGLOBIN AND HEMATOCRIT, BLOOD
HCT: 28.3 % — ABNORMAL LOW (ref 36.0–46.0)
Hemoglobin: 8.8 g/dL — ABNORMAL LOW (ref 12.0–15.0)

## 2023-07-26 MED ORDER — GABAPENTIN 300 MG PO CAPS
600.0000 mg | ORAL_CAPSULE | Freq: Three times a day (TID) | ORAL | Status: DC
  Administered 2023-07-26 – 2023-08-05 (×29): 600 mg via ORAL
  Filled 2023-07-26 (×20): qty 2
  Filled 2023-07-26: qty 6
  Filled 2023-07-26 (×7): qty 2

## 2023-07-26 MED ORDER — SUCRALFATE 1 GM/10ML PO SUSP
1.0000 g | Freq: Four times a day (QID) | ORAL | Status: DC
  Administered 2023-07-26 – 2023-08-05 (×40): 1 g via ORAL
  Filled 2023-07-26 (×40): qty 10

## 2023-07-26 NOTE — Progress Notes (Signed)
PROGRESS NOTE [name]  AYT:[id number] DOB: 20-Oct-1975 DOA: 07/25/2023 PCP: Felix Pacini, FNP  Brief Narrative/Hospital Course: 47 y.o. female with medical history significant for anxiety/depression, Roux-en-Y gastric bypass, GERD admitted with complaint of abdominal pain nausea and some episodes of vomiting with bright red blood and large amount of bright red clots  and admitted to the hospital for upper GI bleed.  Patient is admitted GI consulted EGD 10/27 >> Patent Billroth I gastroduodenostomy was found,characterized by ulceration, Oozing gastric ulcers with a visible vessel.Treated with bipolar cautery.  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  Trend H&H, continue PPI twice daily, Reglan Carafate.  Per GI okay to discharge on PPI twice daily.  Per surgery advance diet and see how patient does and monitor CBC will need outpatient follow-up for her to discuss about pouch and GJ revision. Diet advanced to bariatric full liquid diet  Anxiety/depression: On Wellbutrin BuSpar Cymbalta Klonopin continue as tolerated PRN  Chronic migraine: Continue pain management-atogepant daily, with Imitrex as needed   Leukocytosis mild,  monitor Thrombocytosis likely due to pneumonia, monitor  DVT prophylaxis: SCDs Start: 07/25/23 0743 Code Status:   Code Status: Full Code Family Communication: plan of care discussed with patient at bedside. Patient status is: admitted as observation but remains hospitalized for ongoing  because of gi bleed Level of care: Progressive   Dispo: The patient is from: home            Anticipated disposition: home once tolerating diet. Objective: Vitals last 24 hrs: Vitals:   07/26/23 1002 07/26/23 1221 07/26/23 1239 07/26/23 1250  BP:   117/76   Pulse:   65   Resp: 17 19 16 17   Temp:   98.7 F (37.1 C)   TempSrc:   Oral   SpO2:   100%   Weight:      Height:       Weight change:   Physical Examination: General exam: alert awake, older than stated age HEENT:Oral mucosa moist, Ear/Nose WNL grossly Respiratory system: bilaterally clear BS, no use of accessory muscle Cardiovascular system: S1 & S2 +, No JVD. Gastrointestinal system: Abdomen soft,NT,ND, BS+ Nervous System:Alert, awake, moving extremities. Extremities: LE edema neg,distal peripheral pulses palpable.  Skin: No rashes,no icterus. MSK: Normal muscle bulk,tone, power  Medications reviewed:  Scheduled Meds:  Atogepant  30 mg Oral Daily   atorvastatin  40 mg Oral Daily   buPROPion  450 mg Oral q morning   busPIRone  15 mg Oral TID   DULoxetine  60 mg Oral BID   linaclotide  290 mcg Oral QAC breakfast   mesalamine  2.4 g Oral Q breakfast   metoCLOPramide (REGLAN) injection  5 mg Intravenous Once   pantoprazole (PROTONIX) IV  40 mg Intravenous  Q12H   sucralfate  1 g Oral Q6H  Continuous Infusions:  Diet Order             Diet bariatric full liquid Room service appropriate? Yes; Fluid consistency: Thin  Diet effective now                  Intake/Output Summary (Last 24 hours) at 07/26/2023 1320 Last data filed at 07/26/2023 1122 Gross per 24 hour  Intake 580 ml  Output --  Net 580 ml   Net IO Since  Admission: 980 mL [07/26/23 1320]  Wt Readings from Last 3 Encounters:  07/25/23 61.2 kg  02/28/23 76 kg  01/26/23 75.6 kg     Unresulted Labs (From admission, onward)     Start     Ordered   07/25/23 0320  Protime-INR  Once,   STAT        07/25/23 0319          Data Reviewed: I have personally reviewed following labs and imaging studies CBC: Recent Labs  Lab 07/25/23 0315 07/25/23 0840 07/25/23 1424 07/26/23 0027 07/26/23 0505  WBC 10.9*  --   --  8.0  --   HGB 12.1 9.8* 9.9* 9.8* 8.8*  HCT 38.6 31.0* 32.1* 31.1* 28.3*  MCV 92.1  --   --  94.0  --   PLT 405*  --   --  280  --    Basic Metabolic Panel: Recent Labs  Lab 07/25/23 0315 07/26/23 0027  NA 141 139  K 3.6 3.7  CL 106 108  CO2 27 25  GLUCOSE 118* 73  BUN 25* 15  CREATININE 0.65 0.63  CALCIUM 9.4 8.7*   GFR: Estimated Creatinine Clearance: 85.8 mL/min (by C-G formula based on SCr of 0.63 mg/dL). Liver Function Tests: Recent Labs  Lab 07/25/23 0315  AST 15  ALT 17  ALKPHOS 67  BILITOT 0.6  PROT 7.2  ALBUMIN 3.6   Recent Labs  Lab 07/25/23 0315  LIPASE 30   No results found for this or any previous visit (from the past 240 hour(s)).  Antimicrobials: Anti-infectives (From admission, onward)    None      Culture/Microbiology    Component Value Date/Time   SDES URINE, RANDOM 06/14/2017 0833   SPECREQUEST NONE 06/14/2017 0833   CULT (A) 06/14/2017 0833    >=100,000 COLONIES/mL ESCHERICHIA COLI Confirmed Extended Spectrum Beta-Lactamase Producer (ESBL)    REPTSTATUS 06/18/2017 FINAL 06/14/2017 0833   Radiology Studies: CT ANGIO ABDOMEN PELVIS  W & WO CONTRAST  Result Date: 07/25/2023 CLINICAL DATA:  Lower GI bleed. Hematemesis with bright red clots and some blood in stool. History of ulcers. EXAM: CTA ABDOMEN AND PELVIS WITHOUT AND WITH CONTRAST TECHNIQUE: Multidetector CT imaging of the abdomen and pelvis was performed using the standard protocol during bolus administration of  intravenous contrast. Multiplanar reconstructed images and MIPs were obtained and reviewed to evaluate the vascular anatomy. RADIATION DOSE REDUCTION: This exam was performed according to the departmental dose-optimization program which includes automated exposure control, adjustment of the mA and/or kV according to patient size and/or use of iterative reconstruction technique. CONTRAST:  OMNIPAQUE IOHEXOL 350 MG/ML SOLN COMPARISON:  01/25/2023. FINDINGS: VASCULAR Aorta: Normal caliber aorta without aneurysm, dissection, vasculitis or significant stenosis. Celiac: Patent without evidence of aneurysm, dissection, vasculitis or significant stenosis. Left hepatic artery originates from the aorta. SMA: Patent without evidence of aneurysm, dissection, vasculitis or significant stenosis. Right hepatic artery originates on the SMA. Renals: Both  renal arteries are patent without evidence of aneurysm, dissection, vasculitis, fibromuscular dysplasia or significant stenosis. IMA: Patent without evidence of aneurysm, dissection, vasculitis or significant stenosis. Inflow: Patent without evidence of aneurysm, dissection, vasculitis or significant stenosis. Proximal Outflow: Bilateral common femoral and visualized portions of the superficial and profunda femoral arteries are patent without evidence of aneurysm, dissection, vasculitis or significant stenosis. Veins: No obvious venous abnormality within the limitations of this arterial phase study. Review of the MIP images confirms the above findings. NON-VASCULAR Lower chest: The heart is normal in size and there is a small pericardial effusion. The lung bases are clear. Hepatobiliary: No focal liver abnormality is seen. No gallstones, gallbladder wall thickening, or biliary dilatation. Pancreas: Unremarkable. No pancreatic ductal dilatation or surrounding inflammatory changes. Spleen: Normal size. A 2.0 cm hypodensity is noted in the spleen, likely cyst. Adrenals/Urinary  Tract: The adrenal glands are within normal limits. No renal calculus or hydronephrosis bilaterally. The bladder is unremarkable. Stomach/Bowel: There is a small hiatal hernia with thickening and edema of the walls of the distal esophagus. Gastric bypass surgical changes are present. Appendix appears normal. There is bowel wall thickening with surrounding edema involving extending from the gastrojejunostomy anastomosis. No bowel obstruction, free air, or pneumatosis. No acute hemorrhage is seen. Lymphatic: No significant vascular findings are present. No enlarged abdominal or pelvic lymph nodes. Reproductive: Status post hysterectomy. No adnexal masses. Other: No abdominopelvic ascites. Musculoskeletal: A left sacral neurostimulator device is noted. 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 Degenerative changes are noted in the thoracolumbar spine. No acute osseous abnormality. IMPRESSION: VASCULAR No evidence of acute or active hemorrhage. NON-VASCULAR 1. Status post gastric bypass with bowel wall thickening and edema at the gastrojejunostomy anastomosis, which may be infectious or inflammatory. 2. Small hiatal hernia with mild thickening of the walls of the distal esophagus with associated edema, suggesting esophagitis. Electronically Signed   By: Thornell Sartorius M.D.   On: 07/25/2023 04:39     LOS: 0 days   Lanae Boast, MD [facility]  07/26/2023, 1:20 PM

## 2023-07-26 NOTE — Progress Notes (Signed)
Subjective: Patient states she had a bowel movement today and stools were black. She is very concerned about significant weight loss of almost 40 pounds since July of this year. She is very concerned about only tolerating liquid diet and protein shakes for many years.  Objective: Vital signs in last 24 hours: Temp:  [97.2 F (36.2 C)-99.9 F (37.7 C)] 98.7 F (37.1 C) (10/28 0540) Pulse Rate:  [67-82] 68 (10/28 0540) Resp:  [10-20] 18 (10/28 0540) BP: (104-130)/(60-82) 104/60 (10/28 0540) SpO2:  [100 %] 100 % (10/28 0540) Weight change:  Last BM Date : 07/25/23  PE: Thin GENERAL:not in distress, mild pallor ABDOMEN: Diffuse generalized tenderness, has a heating pad on abdomen EXTREMITIES: No deformity  Lab Results: Results for orders placed or performed during the hospital encounter of 07/25/23 (from the past 48 hour(s))  Lipase, blood     Status: None   Collection Time: 07/25/23  3:15 AM  Result Value Ref Range   Lipase 30 11 - 51 U/L    Comment: Performed at [facility], [address] [place], [place] [address]  Comprehensive metabolic panel     Status: Abnormal   Collection Time: 07/25/23  3:15 AM  Result Value Ref Range   Sodium 141 135 - 145 mmol/L   Potassium 3.6 3.5 - 5.1 mmol/L   Chloride 106 98 - 111 mmol/L   CO2 27 22 - 32 mmol/L   Glucose, Bld 118 (H) 70 - 99 mg/dL    Comment: Glucose reference range applies only to samples taken after fasting for at least 8 hours.   BUN 25 (H) 6 - 20 mg/dL   Creatinine, Ser 6.04 0.44 - 1.00 mg/dL   Calcium 9.4 8.9 - 54.0 mg/dL   Total Protein 7.2 6.5 - 8.1 g/dL   Albumin 3.6 3.5 - 5.0 g/dL   AST 15 15 - 41 U/L   ALT 17 0 - 44 U/L   Alkaline Phosphatase 67 38 - 126 U/L   Total Bilirubin 0.6 0.3 - 1.2 mg/dL   GFR, Estimated >98 >11 mL/min    Comment: (NOTE) Calculated using the CKD-EPI Creatinine Equation (2021)    Anion gap 8 5 - 15    Comment: Performed at [facility], [address], [place], [place] [address]  CBC     Status: Abnormal   Collection Time: 07/25/23  3:15 AM  Result Value Ref Range   WBC 10.9 (H) 4.0 - 10.5 K/uL   RBC 4.19 3.87 - 5.11 MIL/uL   Hemoglobin 12.1 12.0 - 15.0 g/dL   HCT 29.5 62.1 - 30.8 %   MCV 92.1 80.0 - 100.0 fL   MCH 28.9 26.0 - 34.0 pg   MCHC 31.3 30.0 - 36.0 g/dL   RDW 65.7 (H) 84.6 - 96.2 %   Platelets 405 (H) 150 - 400 K/uL   nRBC 0.0 0.0 - 0.2 %    Comment: Performed at [facility], [address], [place], [place] [address]  Urinalysis, Routine w reflex microscopic -Urine, Clean Catch     Status: Abnormal   Collection Time: 07/25/23  3:15 AM  Result Value Ref Range   Color, Urine AMBER (A) YELLOW    Comment: BIOCHEMICALS MAY BE AFFECTED BY COLOR   APPearance CLOUDY (A) CLEAR   Specific Gravity, Urine 1.036 (H) 1.005 - 1.030   pH 5.0 5.0 - 8.0   Glucose, UA NEGATIVE NEGATIVE mg/dL   Hgb urine dipstick NEGATIVE NEGATIVE  Bilirubin Urine SMALL (A) NEGATIVE   Ketones, ur 5 (A) NEGATIVE mg/dL   Protein, ur 161 (A) NEGATIVE mg/dL   Nitrite NEGATIVE NEGATIVE   Leukocytes,Ua SMALL (A) NEGATIVE   RBC / HPF 0-5 0 - 5 RBC/hpf   WBC, UA 11-20 0 - 5 WBC/hpf   Bacteria, UA NONE SEEN NONE SEEN   Squamous Epithelial / HPF >50 0 - 5 /HPF   Mucus PRESENT    Hyaline Casts, UA PRESENT     Comment: Performed at [facility], [address] [place], [place] [address]  Type and screen [facility]     Status: None   Collection Time: 07/25/23  3:35 AM  Result Value Ref Range   ABO/RH(D) O POS    Antibody Screen NEG    Sample Expiration      07/28/2023,2359 Performed at [facility], [address], [place], [place] [address]   Hemoglobin and hematocrit, blood     Status: Abnormal   Collection Time: 07/25/23  8:40 AM  Result Value Ref Range   Hemoglobin 9.8 (L) 12.0 - 15.0 g/dL   HCT 11.9 (L) 14.7 - 82.9 %    Comment: Performed at [facility], [address] [place], [place] [address]  Hemoglobin and hematocrit, blood     Status: Abnormal   Collection Time: 07/25/23  2:24 PM  Result Value Ref Range   Hemoglobin 9.9 (L) 12.0 - 15.0 g/dL   HCT 08.6 (L) 57.8 - 46.9 %    Comment: Performed at [facility], [address], [place], [place] [address]  Basic metabolic panel     Status: Abnormal   Collection Time: 07/26/23 12:27 AM  Result Value Ref Range   Sodium 139 135 - 145 mmol/L   Potassium 3.7 3.5 - 5.1 mmol/L   Chloride 108 98 - 111 mmol/L   CO2 25 22 - 32 mmol/L   Glucose, Bld 73 70 - 99 mg/dL    Comment: Glucose reference range applies only to samples taken after fasting for at least 8 hours.   BUN 15 6 - 20 mg/dL   Creatinine, Ser 8.41 0.44 - 1.00 mg/dL   Calcium 8.7 (L) 8.9 - 10.3 mg/dL   GFR, Estimated >32 >44 mL/min    Comment: (NOTE) Calculated using the CKD-EPI Creatinine Equation (2021)    Anion gap 6 5 - 15    Comment: Performed at [facility], [address] [place], [place] [address]  CBC     Status: Abnormal   Collection Time: 07/26/23 12:27 AM  Result Value Ref Range   WBC 8.0 4.0 - 10.5 K/uL   RBC 3.31 (L) 3.87 - 5.11 MIL/uL   Hemoglobin 9.8 (L) 12.0 - 15.0 g/dL   HCT 25.3 (L) 66.4 - 40.3 %   MCV 94.0 80.0 - 100.0 fL   MCH 29.6 26.0 - 34.0 pg   MCHC 31.5 30.0 - 36.0 g/dL   RDW 47.4 (H) 25.9 - 56.3 %   Platelets 280 150 - 400 K/uL   nRBC 0.0 0.0 - 0.2 %    Comment: Performed at [facility], [address] [place], [place] [address]  Hemoglobin and hematocrit, blood     Status: Abnormal   Collection Time: 07/26/23  5:05 AM  Result Value Ref Range   Hemoglobin 8.8 (L) 12.0 - 15.0 g/dL   HCT 33.2 (L) 95.1 - 88.4 %  Comment: Performed at [facility], [address] [place], [place] [address]    Studies/Results: CT ANGIO ABDOMEN PELVIS  W & WO CONTRAST  Result Date: 07/25/2023 CLINICAL DATA:   Lower GI bleed. Hematemesis with bright red clots and some blood in stool. History of ulcers. EXAM: CTA ABDOMEN AND PELVIS WITHOUT AND WITH CONTRAST TECHNIQUE: Multidetector CT imaging of the abdomen and pelvis was performed using the standard protocol during bolus administration of intravenous contrast. Multiplanar reconstructed images and MIPs were obtained and reviewed to evaluate the vascular anatomy. RADIATION DOSE REDUCTION: This exam was performed according to the departmental dose-optimization program which includes automated exposure control, adjustment of the mA and/or kV according to patient size and/or use of iterative reconstruction technique. CONTRAST:  OMNIPAQUE IOHEXOL 350 MG/ML SOLN COMPARISON:  01/25/2023. FINDINGS: VASCULAR Aorta: Normal caliber aorta without aneurysm, dissection, vasculitis or significant stenosis. Celiac: Patent without evidence of aneurysm, dissection, vasculitis or significant stenosis. Left hepatic artery originates from the aorta. SMA: Patent without evidence of aneurysm, dissection, vasculitis or significant stenosis. Right hepatic artery originates on the SMA. Renals: Both renal arteries are patent without evidence of aneurysm, dissection, vasculitis, fibromuscular dysplasia or significant stenosis. IMA: Patent without evidence of aneurysm, dissection, vasculitis or significant stenosis. Inflow: Patent without evidence of aneurysm, dissection, vasculitis or significant stenosis. Proximal Outflow: Bilateral common femoral and visualized portions of the superficial and profunda femoral arteries are patent without evidence of aneurysm, dissection, vasculitis or significant stenosis. Veins: No obvious venous abnormality within the limitations of this arterial phase study. Review of the MIP images confirms the above findings. NON-VASCULAR Lower chest: The heart is normal in size and there is a small pericardial effusion. The lung bases are clear. Hepatobiliary: No focal  liver abnormality is seen. No gallstones, gallbladder wall thickening, or biliary dilatation. Pancreas: Unremarkable. No pancreatic ductal dilatation or surrounding inflammatory changes. Spleen: Normal size. A 2.0 cm hypodensity is noted in the spleen, likely cyst. Adrenals/Urinary Tract: The adrenal glands are within normal limits. No renal calculus or hydronephrosis bilaterally. The bladder is unremarkable. Stomach/Bowel: There is a small hiatal hernia with thickening and edema of the walls of the distal esophagus. Gastric bypass surgical changes are present. Appendix appears normal. There is bowel wall thickening with surrounding edema involving extending from the gastrojejunostomy anastomosis. No bowel obstruction, free air, or pneumatosis. No acute hemorrhage is seen. Lymphatic: No significant vascular findings are present. No enlarged abdominal or pelvic lymph nodes. Reproductive: Status post hysterectomy. No adnexal masses. Other: No abdominopelvic ascites. Musculoskeletal: A left sacral neurostimulator device is noted. 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 Degenerative changes are noted in the thoracolumbar spine. No acute osseous abnormality. IMPRESSION: VASCULAR No evidence of acute or active hemorrhage. NON-VASCULAR 1. Status post gastric bypass with bowel wall thickening and edema at the gastrojejunostomy anastomosis, which may be infectious or inflammatory. 2. Small hiatal hernia with mild thickening of the walls of the distal esophagus with associated edema, suggesting esophagitis. Electronically Signed   By: Thornell Sartorius M.D.   On: 07/25/2023 04:39    Medications: I have reviewed the patient's current medications.  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 Glenna Fellows), previous gastric sleeve revised to Roux-en-Y gastric bypass bowel wall thickening with edema extending from the gastrojejunostomy anastomosis Small hiatal hernia  Chronic  idiopathic constipation-on Linzess (290 mcg once a day ) and Symproic 0.2 mg once a day(as an outpatient)  Plan: Tolerating clear liquid diet. Surgical evaluation noted, possibly will require revision, felt that her pouch is likely too big and contributing to nonhealing ulcers. Plan is to have her see surgical team as an outpatient to discuss pouch and GJ revision. Okay to DC home from GI standpoint. Continue PPI twice a day and sucralfate 4 times a day meanwhile.  Kerin Salen, MD 07/26/2023, 9:51 AM

## 2023-07-26 NOTE — Discharge Instructions (Addendum)
Ulcer Treatment  Perscriptions: - OMEPRAZOLE (PRILOSEC OR MD APPROVED SUBSTITUTION) - CARAFATE (SUCRALFATE)  SYMPTOMS WILL START TO IMPROVE IN 10-14 DAYS  YOU MUST TAKE THE REGIMEN AS DESCRIBED BELOW FOR THE FULL 90 DAYS OF THERAPY FOR MAXIMUM RESULTS  Instructions: - OMEPRAZOLE UPON WAKING UP IN THE MORNING o (EAT BREAKFAST 30 MINUTES AFTER TAKING) o Open omeprazole capsule and mix it in 2 ounces of water and add a few drops of lemon/lime juice drink then drink as a slurry  - CARAFATE 30 MINUTES BEFORE EATING LUNCH o Crush the Carafate tablet and add to 2-3 ounces of room temperature water, drink as a slurry  - REPEAT STEP #1 30 MINUTES PRIOR TO EATING DINNER  - REPEAT STEP # 2 PRIOR TO BEDTIME  IMPORTANT: DO NOT take Omeprazole and Carafate within 30 minutes of each other!    Avoid caffeine, NSAIDS (aspirin, motrin, advil, ibuprofen, naproxen, aleve, goodie powder, or BC powder), or alcohol.  If you have any questions concerning your regimen, please the office: [phone]  Managing Peptic Ulcers  What is a Peptic Ulcer? ? An ulcer is the breakdown in the lining or the tissue under the lining of the stomach or other area of the intestines.  The breakdown in these tissues can cause damage by stomach acid and digestive enzymes which can lead to pain, burning, and bleeding. What causes peptic ulcers? ? Smoking ? Alcohol ? H. pylori bacteria ? Use of NSAIDS (Aspirin, ibuprofen, Motrin, Advil, Aleve, Naprosyn, Mobic, Meloxicam) ? Caffeine ? Coffee or black tea (including decaf)  The symptoms of peptic ulcers are:  Nausea  Vomiting  Loss of appetite  Unintentional weight loss  Burping or hiccups  Abdominal pain or discomfort  Back pain  Black/tarry stools  Diet/Food restrictions and recommendations for ulcers: ? Avoid coffee and black tea ? Avoid tobacco ? Avoid alcohol ? Avoid milk and dairy products (try soy milk or unsweetened almond milk products  instead) ? Avoid onions, tomatoes, and citrus fruitsEat 5-6 small meals daily ? Try: green tea or chamomile tea ? Start a probiotic (strains including L. acidophilus, L. plantarum, and B. lungum have been shown to be beneficial) ? Try: 1 teaspoon aloe vera juice after meals ? Consume food high in vitamin C:      dark leafy greens (kale, spinach), red bell peppers, asparagus, cabbage,  cauliflower, cantaloupe, honeydew melon, apples, kiwi, and berries ? Consume more protein: lean meats, soy cheese, eggs, fish, tofu  If symptoms continue, complete a food log and follow up with your bariatric dietitian and bariatric surgeon.  [facility] [employer] [facility] in [place]  [facility] [employer] in [place]

## 2023-07-26 NOTE — Plan of Care (Signed)

## 2023-07-26 NOTE — Progress Notes (Signed)
1 Day Post-Op  Subjective: Continues to have epigastric pain.  EGD from yesterday reviewed.  No BM today to determine if she is having further bleeding.  Cold liquids feel like razor blades to swallow.  Warm liquids are better.  ROS: See above, otherwise other systems negative  Objective: Vital signs in last 24 hours: Temp:  [97.2 F (36.2 C)-99.9 F (37.7 C)] 98.7 F (37.1 C) (10/28 0540) Pulse Rate:  [67-82] 68 (10/28 0540) Resp:  [10-20] 18 (10/28 0540) BP: (104-130)/(60-82) 104/60 (10/28 0540) SpO2:  [100 %] 100 % (10/28 0540) Last BM Date : 07/25/23  Intake/Output from previous day: 10/27 0701 - 10/28 0700 In: 740 [P.O.:340; I.V.:400] Out: -  Intake/Output this shift: No intake/output data recorded.  PE: Abd: soft, tender in epigastrium, ND  Lab Results:  Recent Labs    07/25/23 0315 07/25/23 0840 07/26/23 0027 07/26/23 0505  WBC 10.9*  --  8.0  --   HGB 12.1   < > 9.8* 8.8*  HCT 38.6   < > 31.1* 28.3*  PLT 405*  --  280  --    < > = values in this interval not displayed.   BMET Recent Labs    07/25/23 0315 07/26/23 0027  NA 141 139  K 3.6 3.7  CL 106 108  CO2 27 25  GLUCOSE 118* 73  BUN 25* 15  CREATININE 0.65 0.63  CALCIUM 9.4 8.7*   PT/INR No results for input(s): "LABPROT", "INR" in the last 72 hours. CMP     Component Value Date/Time   NA 139 07/26/2023 0027   NA 137 06/11/2022 1620   K 3.7 07/26/2023 0027   CL 108 07/26/2023 0027   CO2 25 07/26/2023 0027   GLUCOSE 73 07/26/2023 0027   BUN 15 07/26/2023 0027   BUN 20 06/11/2022 1620   CREATININE 0.63 07/26/2023 0027   CREATININE 0.88 06/05/2016 1003   CALCIUM 8.7 (L) 07/26/2023 0027   PROT 7.2 07/25/2023 0315   PROT 7.4 06/11/2022 1620   ALBUMIN 3.6 07/25/2023 0315   ALBUMIN 4.7 06/11/2022 1620   AST 15 07/25/2023 0315   ALT 17 07/25/2023 0315   ALKPHOS 67 07/25/2023 0315   BILITOT 0.6 07/25/2023 0315   BILITOT 0.4 06/11/2022 1620   GFRNONAA >60 07/26/2023 0027    GFRNONAA 84 06/05/2016 1003   GFRAA >60 01/24/2020 0253   GFRAA >89 06/05/2016 1003   Lipase     Component Value Date/Time   LIPASE 30 07/25/2023 0315       Studies/Results: CT ANGIO ABDOMEN PELVIS  W & WO CONTRAST  Result Date: 07/25/2023 CLINICAL DATA:  Lower GI bleed. Hematemesis with bright red clots and some blood in stool. History of ulcers. EXAM: CTA ABDOMEN AND PELVIS WITHOUT AND WITH CONTRAST TECHNIQUE: Multidetector CT imaging of the abdomen and pelvis was performed using the standard protocol during bolus administration of intravenous contrast. Multiplanar reconstructed images and MIPs were obtained and reviewed to evaluate the vascular anatomy. RADIATION DOSE REDUCTION: This exam was performed according to the departmental dose-optimization program which includes automated exposure control, adjustment of the mA and/or kV according to patient size and/or use of iterative reconstruction technique. CONTRAST:  OMNIPAQUE IOHEXOL 350 MG/ML SOLN COMPARISON:  01/25/2023. FINDINGS: VASCULAR Aorta: Normal caliber aorta without aneurysm, dissection, vasculitis or significant stenosis. Celiac: Patent without evidence of aneurysm, dissection, vasculitis or significant stenosis. Left hepatic artery originates from the aorta. SMA: Patent without evidence of aneurysm, dissection, vasculitis or significant  stenosis. Right hepatic artery originates on the SMA. Renals: Both renal arteries are patent without evidence of aneurysm, dissection, vasculitis, fibromuscular dysplasia or significant stenosis. IMA: Patent without evidence of aneurysm, dissection, vasculitis or significant stenosis. Inflow: Patent without evidence of aneurysm, dissection, vasculitis or significant stenosis. Proximal Outflow: Bilateral common femoral and visualized portions of the superficial and profunda femoral arteries are patent without evidence of aneurysm, dissection, vasculitis or significant stenosis. Veins: No obvious  venous abnormality within the limitations of this arterial phase study. Review of the MIP images confirms the above findings. NON-VASCULAR Lower chest: The heart is normal in size and there is a small pericardial effusion. The lung bases are clear. Hepatobiliary: No focal liver abnormality is seen. No gallstones, gallbladder wall thickening, or biliary dilatation. Pancreas: Unremarkable. No pancreatic ductal dilatation or surrounding inflammatory changes. Spleen: Normal size. A 2.0 cm hypodensity is noted in the spleen, likely cyst. Adrenals/Urinary Tract: The adrenal glands are within normal limits. No renal calculus or hydronephrosis bilaterally. The bladder is unremarkable. Stomach/Bowel: There is a small hiatal hernia with thickening and edema of the walls of the distal esophagus. Gastric bypass surgical changes are present. Appendix appears normal. There is bowel wall thickening with surrounding edema involving extending from the gastrojejunostomy anastomosis. No bowel obstruction, free air, or pneumatosis. No acute hemorrhage is seen. Lymphatic: No significant vascular findings are present. No enlarged abdominal or pelvic lymph nodes. Reproductive: Status post hysterectomy. No adnexal masses. Other: No abdominopelvic ascites. Musculoskeletal: A left sacral neurostimulator device is noted. 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 Degenerative changes are noted in the thoracolumbar spine. No acute osseous abnormality. IMPRESSION: VASCULAR No evidence of acute or active hemorrhage. NON-VASCULAR 1. Status post gastric bypass with bowel wall thickening and edema at the gastrojejunostomy anastomosis, which may be infectious or inflammatory. 2. Small hiatal hernia with mild thickening of the walls of the distal esophagus with associated edema, suggesting esophagitis. Electronically Signed   By: Thornell Sartorius M.D.   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 Johna Sheriff -the patient has been discussed with 2 of our bariatric surgeons.  It is felt that her pouch is likely too big contributing to her ulcer not healing despite maximum medical therapy.  She has currently been on carafate slurry and protonix for over a year now along with bentyl.   -she is now having some bleeding secondary to this ulcer. -we will continue carafate and at DC change to omeprazole slurry.  We will get outpatient follow up for her to discussion pouch and GJ revision given she has had maximum medical therapy with persistent non-healing. -discussed with patient. -cont bair CLD today and see how she does.  Cont to follow cbc -on carafate and IV protonix right now  FEN - bari CLD VTE - on hold due to above ID - none currently needed  I reviewed Consultant GI notes, hospitalist notes, last 24 h vitals and pain scores, last 48 h intake and output, last 24 h labs and trends, and last 24 h imaging results.   LOS: 0 days    Letha Cape , [facility] 07/26/2023, 8:19 AM Please see Amion for pager number during day hours 7:00am-4:30pm or 7:00am -11:30am on weekends

## 2023-07-26 NOTE — Hospital Course (Addendum)
47 y.o. female with medical history significant for anxiety/depression, Roux-en-Y gastric bypass, GERD admitted with complaint of abdominal pain nausea and some episodes of vomiting with bright red blood and large amount of bright red clots  and admitted to the hospital for upper GI bleed.  Patient is admitted GI consulted EGD 10/27 >> Patent Billroth I gastroduodenostomy was found,characterized by ulceration, Oozing gastric ulcers with a visible vessel.Treated with bipolar cautery.  General surgery consulted as this ulcer has been present for 6 months despite optimal medical therapy with PPI and Carafate.Diet advanced to bariatric advanced  diet. Patient was found to have orthostatic hypotension and dizziness iV fluid bolus was given multiple times, placed on midodrine, compression hose.

## 2023-07-26 NOTE — Plan of Care (Signed)
  Problem: Safety: Goal: Ability to remain free from injury will improve Outcome: Progressing   Problem: Pain Management: Goal: General experience of comfort will improve Outcome: Progressing   Problem: Elimination: Goal: Will not experience complications related to bowel motility Outcome: Progressing   Problem: Coping: Goal: Level of anxiety will decrease Outcome: Progressing   Problem: Nutrition: Goal: Adequate nutrition will be maintained Outcome: Progressing   Problem: Activity: Goal: Risk for activity intolerance will decrease Outcome: Progressing   Problem: Education: Goal: Knowledge of General Education information will improve Description: Including pain rating scale, medication(s)/side effects and non-pharmacologic comfort measures Outcome: Progressing   Problem: Clinical Measurements: Goal: Ability to maintain clinical measurements within normal limits will improve Outcome: Progressing

## 2023-07-27 ENCOUNTER — Encounter (HOSPITAL_COMMUNITY): Payer: Self-pay | Admitting: Internal Medicine

## 2023-07-27 DIAGNOSIS — Z87891 Personal history of nicotine dependence: Secondary | ICD-10-CM | POA: Diagnosis not present

## 2023-07-27 DIAGNOSIS — Z860100 Personal history of colon polyps, unspecified: Secondary | ICD-10-CM | POA: Diagnosis not present

## 2023-07-27 DIAGNOSIS — F32A Depression, unspecified: Secondary | ICD-10-CM | POA: Diagnosis present

## 2023-07-27 DIAGNOSIS — G43909 Migraine, unspecified, not intractable, without status migrainosus: Secondary | ICD-10-CM | POA: Diagnosis present

## 2023-07-27 DIAGNOSIS — K254 Chronic or unspecified gastric ulcer with hemorrhage: Secondary | ICD-10-CM | POA: Diagnosis present

## 2023-07-27 DIAGNOSIS — Z885 Allergy status to narcotic agent status: Secondary | ICD-10-CM | POA: Diagnosis not present

## 2023-07-27 DIAGNOSIS — D62 Acute posthemorrhagic anemia: Secondary | ICD-10-CM | POA: Diagnosis present

## 2023-07-27 DIAGNOSIS — I951 Orthostatic hypotension: Secondary | ICD-10-CM | POA: Diagnosis present

## 2023-07-27 DIAGNOSIS — K5904 Chronic idiopathic constipation: Secondary | ICD-10-CM | POA: Diagnosis present

## 2023-07-27 DIAGNOSIS — Z96653 Presence of artificial knee joint, bilateral: Secondary | ICD-10-CM | POA: Diagnosis present

## 2023-07-27 DIAGNOSIS — Z825 Family history of asthma and other chronic lower respiratory diseases: Secondary | ICD-10-CM | POA: Diagnosis not present

## 2023-07-27 DIAGNOSIS — K922 Gastrointestinal hemorrhage, unspecified: Secondary | ICD-10-CM | POA: Diagnosis present

## 2023-07-27 DIAGNOSIS — Z888 Allergy status to other drugs, medicaments and biological substances status: Secondary | ICD-10-CM | POA: Diagnosis not present

## 2023-07-27 DIAGNOSIS — F419 Anxiety disorder, unspecified: Secondary | ICD-10-CM | POA: Diagnosis present

## 2023-07-27 DIAGNOSIS — D72829 Elevated white blood cell count, unspecified: Secondary | ICD-10-CM | POA: Diagnosis present

## 2023-07-27 DIAGNOSIS — Z881 Allergy status to other antibiotic agents status: Secondary | ICD-10-CM | POA: Diagnosis not present

## 2023-07-27 DIAGNOSIS — Z8249 Family history of ischemic heart disease and other diseases of the circulatory system: Secondary | ICD-10-CM | POA: Diagnosis not present

## 2023-07-27 DIAGNOSIS — Z9884 Bariatric surgery status: Secondary | ICD-10-CM | POA: Diagnosis not present

## 2023-07-27 DIAGNOSIS — D75839 Thrombocytosis, unspecified: Secondary | ICD-10-CM | POA: Diagnosis present

## 2023-07-27 DIAGNOSIS — Z91048 Other nonmedicinal substance allergy status: Secondary | ICD-10-CM | POA: Diagnosis not present

## 2023-07-27 DIAGNOSIS — J45909 Unspecified asthma, uncomplicated: Secondary | ICD-10-CM | POA: Diagnosis present

## 2023-07-27 DIAGNOSIS — Z980 Intestinal bypass and anastomosis status: Secondary | ICD-10-CM | POA: Diagnosis not present

## 2023-07-27 DIAGNOSIS — Z79899 Other long term (current) drug therapy: Secondary | ICD-10-CM | POA: Diagnosis not present

## 2023-07-27 DIAGNOSIS — Z880 Allergy status to penicillin: Secondary | ICD-10-CM | POA: Diagnosis not present

## 2023-07-27 DIAGNOSIS — Z833 Family history of diabetes mellitus: Secondary | ICD-10-CM | POA: Diagnosis not present

## 2023-07-27 LAB — CBC
HCT: 30.1 % — ABNORMAL LOW (ref 36.0–46.0)
Hemoglobin: 9 g/dL — ABNORMAL LOW (ref 12.0–15.0)
MCH: 29.2 pg (ref 26.0–34.0)
MCHC: 29.9 g/dL — ABNORMAL LOW (ref 30.0–36.0)
MCV: 97.7 fL (ref 80.0–100.0)
Platelets: 259 10*3/uL (ref 150–400)
RBC: 3.08 MIL/uL — ABNORMAL LOW (ref 3.87–5.11)
RDW: 15.8 % — ABNORMAL HIGH (ref 11.5–15.5)
WBC: 5.7 10*3/uL (ref 4.0–10.5)
nRBC: 0 % (ref 0.0–0.2)

## 2023-07-27 LAB — BASIC METABOLIC PANEL
Anion gap: 8 (ref 5–15)
BUN: 12 mg/dL (ref 6–20)
CO2: 25 mmol/L (ref 22–32)
Calcium: 9 mg/dL (ref 8.9–10.3)
Chloride: 107 mmol/L (ref 98–111)
Creatinine, Ser: 0.61 mg/dL (ref 0.44–1.00)
GFR, Estimated: >60 mL/min (ref >60)
Glucose, Bld: 74 mg/dL (ref 70–99)
Potassium: 3.9 mmol/L (ref 3.5–5.1)
Sodium: 140 mmol/L (ref 135–145)

## 2023-07-27 MED ORDER — FAMOTIDINE 20 MG PO TABS
20.0000 mg | ORAL_TABLET | Freq: Every day | ORAL | Status: DC
  Administered 2023-07-27 – 2023-08-05 (×10): 20 mg via ORAL
  Filled 2023-07-27 (×10): qty 1

## 2023-07-27 MED ORDER — CYANOCOBALAMIN 1000 MCG/ML IJ SOLN
1000.0000 ug | INTRAMUSCULAR | Status: DC
  Administered 2023-07-27 – 2023-08-05 (×4): 1000 ug via INTRAMUSCULAR
  Filled 2023-07-27 (×4): qty 1

## 2023-07-27 MED ORDER — THIAMINE MONONITRATE 100 MG PO TABS
100.0000 mg | ORAL_TABLET | Freq: Every day | ORAL | Status: DC
  Administered 2023-07-27 – 2023-08-05 (×10): 100 mg via ORAL
  Filled 2023-07-27 (×10): qty 1

## 2023-07-27 MED ORDER — OXYBUTYNIN CHLORIDE 5 MG PO TABS
10.0000 mg | ORAL_TABLET | Freq: Every day | ORAL | Status: DC
  Administered 2023-07-27 – 2023-08-04 (×9): 10 mg via ORAL
  Filled 2023-07-27 (×9): qty 2

## 2023-07-27 MED ORDER — VITAMIN D (ERGOCALCIFEROL) 1.25 MG (50000 UNIT) PO CAPS
50000.0000 [IU] | ORAL_CAPSULE | ORAL | Status: DC
  Administered 2023-07-27 – 2023-08-03 (×2): 50000 [IU] via ORAL
  Filled 2023-07-27 (×2): qty 1

## 2023-07-27 MED ORDER — CARMEX CLASSIC LIP BALM EX OINT
TOPICAL_OINTMENT | CUTANEOUS | Status: DC | PRN
  Administered 2023-07-27 – 2023-08-01 (×2): 1 via TOPICAL
  Filled 2023-07-27 (×2): qty 10

## 2023-07-27 NOTE — Progress Notes (Signed)
PROGRESS NOTE [name]  NFA:[id number] DOB: [birth year] DOA: 07/25/2023 PCP: Felix Pacini, FNP  Brief Narrative/Hospital Course: 47 y.o. female with medical history significant for anxiety/depression, Roux-en-Y gastric bypass, GERD admitted with complaint of abdominal pain nausea and some episodes of vomiting with bright red blood and large amount of bright red clots  and admitted to the hospital for upper GI bleed.  Patient is admitted GI consulted EGD 10/27 >> Patent Billroth I gastroduodenostomy was found,characterized by ulceration, Oozing gastric ulcers with a visible vessel.Treated with bipolar cautery.  General surgery consulted as this ulcer has been present for 6 months despite optimal medical therapy with PPI and Carafate    Subjective: Patient seen and examined this morning Reported having nausea after eating.  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  Trend H&H, continue PPI twice daily, Reglan Carafate.  Per GI okay to discharge on PPI twice daily once cleared by surgery, continue bariatric full liquid diet and see how she does so far not tolerating very well..  Discussed following closely input appreciated monitor H/H. Further planning for outpatient follow-up for her to discuss about pouch and GJ revision. Recent Labs  Lab 07/25/23 0840 07/25/23 1424 07/26/23 0027 07/26/23 0505 07/27/23 0506  HGB 9.8* 9.9* 9.8* 8.8* 9.0*  HCT 31.0* 32.1* 31.1* 28.3* 30.1*     Anxiety/depression: On Wellbutrin BuSpar Cymbalta Klonopin continue as tolerated PRN.  Mood stable  Chronic migraine: Continue pain  management-atogepant daily, with Imitrex as needed   Leukocytosis mild, monitor Thrombocytosis likely due #1.  Resolved  DVT prophylaxis: SCDs Start: 07/25/23 0743 Code Status:   Code Status: Full Code Family Communication: plan of care discussed with patient at bedside. Patient status is: admitted as observation but remains hospitalized for ongoing  because of gi bleed Level of care: Med-Surg   Dispo: The patient is from: home            Anticipated disposition: home once tolerating diet.   Objective: Vitals last 24 hrs: Vitals:   07/27/23 0730 07/27/23 0924 07/27/23 0954 07/27/23 1242  BP:    106/68  Pulse:    70  Resp: 17 19 17 16   Temp:    98.6 F (37 C)  TempSrc:    Oral  SpO2:    99%  Weight:      Height:       Weight change:   Physical Examination: General exam: alert awake, oriented HEENT:Oral mucosa moist, Ear/Nose WNL grossly Respiratory system: Bilaterally clear BS,no use of accessory muscle Cardiovascular system: S1 & S2 +, No JVD. Gastrointestinal system: Abdomen soft,NT,ND, BS+ Nervous System: Alert, awake, moving all extremities,and following commands. Extremities: LE edema neg,distal peripheral pulses palpable and warm.  Skin: No rashes,no icterus. MSK: Normal muscle bulk,tone, power    Medications reviewed:  Scheduled Meds:  Atogepant  30 mg Oral Daily   atorvastatin  40 mg Oral Daily   buPROPion  450 mg Oral q morning   busPIRone  15 mg Oral TID   DULoxetine  60 mg Oral BID   gabapentin  600 mg Oral TID   linaclotide  290 mcg Oral QAC breakfast   mesalamine  2.4  g Oral Q breakfast   metoCLOPramide (REGLAN) injection  5 mg Intravenous Once   pantoprazole (PROTONIX) IV  40 mg Intravenous Q12H   sucralfate  1 g Oral Q6H  Continuous Infusions:  Diet Order             Diet bariatric full liquid Room service appropriate? Yes; Fluid consistency: Thin  Diet effective now                  Intake/Output Summary (Last 24 hours) at 07/27/2023  1252 Last data filed at 07/27/2023 0528 Gross per 24 hour  Intake 960 ml  Output --  Net 960 ml   Net IO Since Admission: 2,180 mL [07/27/23 1252]  Wt Readings from Last 3 Encounters:  07/25/23 61.2 kg  02/28/23 76 kg  01/26/23 75.6 kg     Unresulted Labs (From admission, onward)     Start     Ordered   07/25/23 0320  Protime-INR  Once,   STAT        07/25/23 0319          Data Reviewed: I have personally reviewed following labs and imaging studies CBC: Recent Labs  Lab 07/25/23 0315 07/25/23 0840 07/25/23 1424 07/26/23 0027 07/26/23 0505 07/27/23 0506  WBC 10.9*  --   --  8.0  --  5.7  HGB 12.1 9.8* 9.9* 9.8* 8.8* 9.0*  HCT 38.6 31.0* 32.1* 31.1* 28.3* 30.1*  MCV 92.1  --   --  94.0  --  97.7  PLT 405*  --   --  280  --  259   Basic Metabolic Panel: Recent Labs  Lab 07/25/23 0315 07/26/23 0027 07/27/23 0506  NA 141 139 140  K 3.6 3.7 3.9  CL 106 108 107  CO2 27 25 25   GLUCOSE 118* 73 74  BUN 25* 15 12  CREATININE 0.65 0.63 0.61  CALCIUM 9.4 8.7* 9.0   GFR: Estimated Creatinine Clearance: 85.8 mL/min (by C-G formula based on SCr of 0.61 mg/dL). Liver Function Tests: Recent Labs  Lab 07/25/23 0315  AST 15  ALT 17  ALKPHOS 67  BILITOT 0.6  PROT 7.2  ALBUMIN 3.6   Recent Labs  Lab 07/25/23 0315  LIPASE 30   No results found for this or any previous visit (from the past 240 hour(s)).  Antimicrobials: Anti-infectives (From admission, onward)    None      Culture/Microbiology    Component Value Date/Time   SDES URINE, RANDOM 06/14/2017 0833   SPECREQUEST NONE 06/14/2017 0833   CULT (A) 06/14/2017 0833    >=100,000 COLONIES/mL ESCHERICHIA COLI Confirmed Extended Spectrum Beta-Lactamase Producer (ESBL)    REPTSTATUS 06/18/2017 FINAL 06/14/2017 9528   Radiology Studies: No results found.   LOS: 0 days   Lanae Boast, MD [facility]  07/27/2023, 12:52 PM

## 2023-07-27 NOTE — Plan of Care (Signed)

## 2023-07-27 NOTE — Progress Notes (Signed)
2 Days Post-Op   Subjective/Chief Complaint: She reports she still has some mild burning abdominal discomfort Feels weak when ambulating   Objective: Vital signs in last 24 hours: Temp:  [98.4 F (36.9 C)-98.7 F (37.1 C)] 98.4 F (36.9 C) (10/28 2215) Pulse Rate:  [63-65] 63 (10/28 2215) Resp:  [15-19] 17 (10/29 0730) BP: (113-117)/(73-76) 113/73 (10/28 2215) SpO2:  [100 %] 100 % (10/28 2215) Last BM Date : 07/26/23  Intake/Output from previous day: 10/28 0701 - 10/29 0700 In: 1440 [P.O.:1440] Out: -  Intake/Output this shift: No intake/output data recorded.  Exam: Awake and alert Up walking Abdomen soft, minimally tender, no peritonitis  Lab Results:  Recent Labs    07/26/23 0027 07/26/23 0505 07/27/23 0506  WBC 8.0  --  5.7  HGB 9.8* 8.8* 9.0*  HCT 31.1* 28.3* 30.1*  PLT 280  --  259   BMET Recent Labs    07/26/23 0027 07/27/23 0506  NA 139 140  K 3.7 3.9  CL 108 107  CO2 25 25  GLUCOSE 73 74  BUN 15 12  CREATININE 0.63 0.61  CALCIUM 8.7* 9.0   PT/INR No results for input(s): "LABPROT", "INR" in the last 72 hours. ABG No results for input(s): "PHART", "HCO3" in the last 72 hours.  Invalid input(s): "PCO2", "PO2"  Studies/Results: No results found.  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 Johna Sheriff -the patient has been discussed with 2 of our bariatric surgeons.  It is felt that her pouch is likely too big contributing to her ulcer not healing despite maximum medical therapy.  She has currently been on carafate slurry and protonix for over a year now along with bentyl.   -she is now having some bleeding secondary to this ulcer. -we will continue carafate and at DC change to omeprazole slurry.  We will get outpatient follow up for her to discussion pouch and GJ revision given she has had maximum  medical therapy with persistent non-healing.  Continue bariatric fulls Hgb up to 9.0 Will follow  Abigail Miyamoto MD 07/27/2023

## 2023-07-28 ENCOUNTER — Ambulatory Visit: Payer: 59 | Admitting: Neurology

## 2023-07-28 DIAGNOSIS — K922 Gastrointestinal hemorrhage, unspecified: Secondary | ICD-10-CM | POA: Diagnosis not present

## 2023-07-28 MED ORDER — SODIUM CHLORIDE 0.9 % IV BOLUS
500.0000 mL | Freq: Once | INTRAVENOUS | Status: AC
  Administered 2023-07-28: 500 mL via INTRAVENOUS

## 2023-07-28 MED ORDER — OMEPRAZOLE MAGNESIUM 20 MG PO TBEC: 20.0000 mg | DELAYED_RELEASE_TABLET | Freq: Two times a day (BID) | ORAL | 1 refills | Status: DC

## 2023-07-28 MED ORDER — HYDROMORPHONE HCL 2 MG PO TABS
4.0000 mg | ORAL_TABLET | Freq: Three times a day (TID) | ORAL | Status: DC | PRN
  Administered 2023-07-28 – 2023-07-29 (×3): 4 mg via ORAL
  Filled 2023-07-28 (×3): qty 2

## 2023-07-28 NOTE — Progress Notes (Signed)
Pt was only able to tolerate a small amount of pudding and jello before experiencing stomach pain and spasms. MD Kc notifed and PRN Bentyl given. Pt stated she just did not feel well and felt dizzy when she got up to the bathroom. Orthostatics checked and resulted as positive. Laying 119/72 HR 78, Sitting 101/73 HR 87, Standing 88/72 HR 81.   Pt was only able tolerate around of oral fluids due to stomach pain.   IV fluids orders. Pt will not be d/ced today.

## 2023-07-28 NOTE — Progress Notes (Signed)
PROGRESS NOTE [name]  YQM:[id number] DOB: [birth year] DOA: 07/25/2023 PCP: Felix Pacini, FNP  Brief Narrative/Hospital Course: 47 y.o. female with medical history significant for anxiety/depression, Roux-en-Y gastric bypass, GERD admitted with complaint of abdominal pain nausea and some episodes of vomiting with bright red blood and large amount of bright red clots  and admitted to the hospital for upper GI bleed.  Patient is admitted GI consulted EGD 10/27 >> Patent Billroth I gastroduodenostomy was found,characterized by ulceration, Oozing gastric ulcers with a visible vessel.Treated with bipolar cautery.  General surgery consulted as this ulcer has been present for 6 months despite optimal medical therapy with PPI and Carafate.Diet advanced to bariatric advanced  diet. Patient was found to have orthostatic hypotension and dizziness iV fluid bolus was given multiple times, placed on midodrine, compression hose.    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  Marginal ulcer present for at least 6 months despite optimal medical therapy.  At this point hemoglobin has been stable.Diet advanced to bariatric advance diet-able to eat some, mostly drinking liquid.  She will continue on PPI slurry.  Further planning for outpatient follow-up for her to discuss about pouch and GJ revision.  Orthostatic hypotension/dizziness: Needed multiple Iv boluses 10/31 and again overnight.  This morning OV: Understanding blood pressure 67/39 from sitting 97/59 118/71.  Continue monitoring, check random serum cortisol and will also obtain cosyntropin test in the  morning.  Continue TED hose.  Minimize narcotic but she is on chronic 4 mg Dilaudid at home   Anxiety/depression: Stable on home Wellbutrin BuSpar Cymbalta Klonopin continue as tolerated PRN.  Mood stable  Chronic migraine: Continue pain management AND opiates at home  Leukocytosis mild, monitor Thrombocytosis likely due #1.  Resolved.  Nursing concerned that patient may be medicating herself, patient advised not to use home narcotics. Pharmacy verified opiate dose.  DVT prophylaxis: SCDs Start: 07/25/23 0743 Code Status:   Code Status: Full Code Family Communication: plan of care discussed with patient at bedside. Patient status is: Inpatient due to diet intolerance orthostasis Level of care: Med-Surg   Dispo: The patient is from: home            Anticipated disposition: home once tolerating diet.   Objective: Vitals last 24 hrs: Vitals:   07/29/23 1222 07/29/23 1300 07/29/23 2009 07/30/23 0604  BP: 112/69  116/74 91/77  Pulse: 72  69 62  Resp: 14  20 16   Temp:  99.1 F (37.3 C) 98.3 F (36.8 C) 98.5 F (36.9 C)  TempSrc:  Oral Oral Oral  SpO2: 100% 100% 99% 100%  Weight:      Height:       Weight change:   Physical Examination: General exam: alert awake, oriented x3 HEENT:Oral mucosa moist, Ear/Nose WNL grossly Respiratory system: Bilaterally clear BS,no use of accessory muscle Cardiovascular system: S1 & S2 +, No JVD. Gastrointestinal system: Abdomen soft,NT,ND, BS+ Nervous System: Alert, awake, moving all extremities,and following commands. Extremities: LE edema neg,distal peripheral pulses palpable and warm.  Skin: No rashes,no icterus. MSK: Normal muscle bulk,tone, power    Medications reviewed:  Scheduled Meds:  Atogepant  30 mg Oral Daily   atorvastatin  40 mg  Oral Daily   buPROPion  450 mg Oral q morning   busPIRone  15 mg Oral TID   [START ON 07/31/2023] cosyntropin  0.25 mg Intravenous Once   cyanocobalamin  1,000 mcg Intramuscular Once per day on  Monday Thursday   DULoxetine  60 mg Oral BID   famotidine  20 mg Oral Daily   gabapentin  600 mg Oral TID   linaclotide  290 mcg Oral QAC breakfast   mesalamine  2.4 g Oral Q breakfast   metoCLOPramide (REGLAN) injection  5 mg Intravenous Once   midodrine  5 mg Oral TID WC   oxybutynin  10 mg Oral QHS   pantoprazole (PROTONIX) IV  40 mg Intravenous Q12H   sucralfate  1 g Oral Q6H   thiamine  100 mg Oral Q1200   Vitamin D (Ergocalciferol)  50,000 Units Oral Q7 days  Continuous Infusions:  Diet Order             Diet bariatric advanced Room service appropriate? Yes; Fluid consistency: Thin  Diet effective now                  Intake/Output Summary (Last 24 hours) at 07/30/2023 1316 Last data filed at 07/29/2023 1901 Gross per 24 hour  Intake 240 ml  Output --  Net 240 ml   Net IO Since Admission: 3,940 mL [07/30/23 1316]  Wt Readings from Last 3 Encounters:  07/25/23 61.2 kg  02/28/23 76 kg  01/26/23 75.6 kg     Unresulted Labs (From admission, onward)     Start     Ordered   07/31/23 0500  ACTH stimulation, 3 time points (baseline, 30 min, 60 min)  (cosyntropin (CORTROSYN) test and labs)  Tomorrow morning,   R       Comments: First sample: Draw before administering Cosyntropin Second sample: Draw 30 minutes after administering Cosyntropin Third sample: Draw 60 minutes after administering Cosyntropin.   Question:  Specimen collection method  Answer:  Lab=Lab collect   07/30/23 1257   07/30/23 1257  Cortisol  Once,   R       Question:  Specimen collection method  Answer:  Lab=Lab collect   07/30/23 1256          Data Reviewed: I have personally reviewed following labs and imaging studies CBC: Recent Labs  Lab 07/25/23 0315 07/25/23 0840 07/25/23 1424 07/26/23 0027 07/26/23 0505 07/27/23 0506 07/30/23 1004  WBC 10.9*  --   --  8.0  --  5.7 5.9  HGB 12.1   < > 9.9* 9.8* 8.8* 9.0* 10.0*  HCT 38.6   < > 32.1* 31.1* 28.3* 30.1* 33.6*  MCV 92.1  --   --   94.0  --  97.7 98.0  PLT 405*  --   --  280  --  259 337   < > = values in this interval not displayed.   Basic Metabolic Panel: Recent Labs  Lab 07/25/23 0315 07/26/23 0027 07/27/23 0506 07/30/23 1004  NA 141 139 140 141  K 3.6 3.7 3.9 4.0  CL 106 108 107 108  CO2 27 25 25 25   GLUCOSE 118* 73 74 95  BUN 25* 15 12 8   CREATININE 0.65 0.63 0.61 0.61  CALCIUM 9.4 8.7* 9.0 9.3   GFR: Estimated Creatinine Clearance: 85.8 mL/min (by C-G formula based on SCr of 0.61 mg/dL). Liver Function Tests: Recent Labs  Lab 07/25/23 0315  AST 15  ALT 17  ALKPHOS 67  BILITOT 0.6  PROT 7.2  ALBUMIN 3.6   Recent Labs  Lab 07/25/23 0315  LIPASE 30   No results found for this or any previous visit (from the past 240 hour(s)).  Antimicrobials: Anti-infectives (From admission, onward)    None      Culture/Microbiology    Component Value Date/Time   SDES URINE, RANDOM 06/14/2017 0833   SPECREQUEST NONE 06/14/2017 0833   CULT (A) 06/14/2017 0833    >=100,000 COLONIES/mL ESCHERICHIA COLI Confirmed Extended Spectrum Beta-Lactamase Producer (ESBL)    REPTSTATUS 06/18/2017 FINAL 06/14/2017 5784   Radiology Studies: No results found.   LOS: 3 days   Lanae Boast, MD [facility]  07/30/2023, 1:16 PM

## 2023-07-28 NOTE — Progress Notes (Signed)
3 Days Post-Op   Subjective/Chief Complaint: She is doing ok with FLD, just still having burning with eating, as expected.   Objective: Vital signs in last 24 hours: Temp:  [98.6 F (37 C)-98.7 F (37.1 C)] 98.7 F (37.1 C) (10/30 0334) Pulse Rate:  [67-76] 67 (10/30 0334) Resp:  [15-19] 17 (10/29 2031) BP: (102-117)/(59-75) 102/59 (10/30 0334) SpO2:  [99 %-100 %] 100 % (10/30 0334) Last BM Date : 07/26/23  Intake/Output from previous day: 10/29 0701 - 10/30 0700 In: 540 [P.O.:540] Out: -  Intake/Output this shift: No intake/output data recorded.  Exam: Awake and alert Abdomen soft, minimally tender, no peritonitis  Lab Results:  Recent Labs    07/26/23 0027 07/26/23 0505 07/27/23 0506  WBC 8.0  --  5.7  HGB 9.8* 8.8* 9.0*  HCT 31.1* 28.3* 30.1*  PLT 280  --  259   BMET Recent Labs    07/26/23 0027 07/27/23 0506  NA 139 140  K 3.7 3.9  CL 108 107  CO2 25 25  GLUCOSE 73 74  BUN 15 12  CREATININE 0.63 0.61  CALCIUM 8.7* 9.0   PT/INR No results for input(s): "LABPROT", "INR" in the last 72 hours. ABG No results for input(s): "PHART", "HCO3" in the last 72 hours.  Invalid input(s): "PCO2", "PO2"  Studies/Results: No results found.  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 Johna Sheriff -the patient has been discussed with 2 of our bariatric surgeons.  It is felt that her pouch is likely too big contributing to her ulcer not healing despite maximum medical therapy.  She has currently been on carafate slurry and protonix for over a year now along with bentyl.   -she is now having some bleeding secondary to this ulcer, but has ceased. -we will continue carafate and at DC change to omeprazole slurry.   -outpatient follow up arranged. -adv to bari advanced diet.  If tolerates, can DC home today -d/w primary  service.   Letha Cape MD 07/28/2023

## 2023-07-28 NOTE — Progress Notes (Signed)
PROGRESS NOTE [name]  WUJ:[id number] DOB: April 22, 1976 DOA: 07/25/2023 PCP: Felix Pacini, FNP  Brief Narrative/Hospital Course: 47 y.o. female with medical history significant for anxiety/depression, Roux-en-Y gastric bypass, GERD admitted with complaint of abdominal pain nausea and some episodes of vomiting with bright red blood and large amount of bright red clots  and admitted to the hospital for upper GI bleed.  Patient is admitted GI consulted EGD 10/27 >> Patent Billroth I gastroduodenostomy was found,characterized by ulceration, Oozing gastric ulcers with a visible vessel.Treated with bipolar cautery.  General surgery consulted as this ulcer has been present for 6 months despite optimal medical therapy with PPI and Carafate. Diet advanced to bariatric full liquid diet and monitored> further advanced to bariatric advance diet.Once she is able to tolerate diet and okay to discharge.  Patient will be discharged home.    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  At this point hemoglobin stable.Diet advanced to bariatric advance diet today but not tolerating very well, will continue on diet keep on IV fluid bolus x 1 as she was orthostatic and dizzy. Further planning for outpatient follow-up for her to discuss about pouch and GJ revision. Recent Labs  Lab 07/25/23 0840 07/25/23 1424 07/26/23 0027 07/26/23 0505 07/27/23 0506  HGB 9.8* 9.9* 9.8* 8.8* 9.0*  HCT 31.0* 32.1* 31.1* 28.3* 30.1*    Orthostatic hypotension: Symptomatic with dizziness will bolus  500 cc IVF, encourage oral intake.  Anxiety/depression: On Wellbutrin BuSpar Cymbalta Klonopin continue as tolerated PRN.  Mood stable  Chronic migraine: Continue pain management-atogepant daily, with Imitrex as needed   Leukocytosis mild, monitor Thrombocytosis likely due #1.  Resolved  DVT prophylaxis: SCDs Start: 07/25/23 0743 Code Status:   Code Status: Full Code Family Communication: plan of care discussed with patient at bedside. Patient status is: Inpatient due to diet intolerance orthostasis Level of care: Med-Surg   Dispo: The patient is from: home            Anticipated disposition: home once tolerating diet.   Objective: Vitals last 24 hrs: Vitals:   07/27/23 1714 07/27/23 2031 07/28/23 0334 07/28/23 1352  BP:  117/75 (!) 102/59 94/62  Pulse:  76 67 88  Resp: 15 17  16   Temp:  98.6 F (37 C) 98.7 F (37.1 C) 98.4 F (36.9 C)  TempSrc:  Oral Oral Oral  SpO2:  100% 100% 98%  Weight:      Height:       Weight change:   Physical Examination: General exam: alert awake, ill-looking, frail.   HEENT:Oral mucosa moist, Ear/Nose WNL grossly Respiratory system: Bilaterally clear BS,no use of accessory muscle Cardiovascular system: S1 & S2 +, No JVD. Gastrointestinal system: Abdomen soft,NT,ND, BS+ Nervous System: Alert, awake, moving all extremities,and following commands. Extremities: LE edema neg,distal peripheral pulses palpable and warm.  Skin: No rashes,no icterus. MSK: Normal muscle bulk,tone, power    Medications reviewed:  Scheduled Meds:  Atogepant  30 mg Oral Daily   atorvastatin  40 mg Oral Daily   buPROPion  450 mg Oral q morning   busPIRone  15 mg Oral  TID   cyanocobalamin  1,000 mcg Intramuscular Once per day on Monday Thursday   DULoxetine  60 mg Oral BID   famotidine  20 mg Oral Daily   gabapentin  600 mg Oral TID   linaclotide  290 mcg Oral QAC breakfast   mesalamine  2.4 g Oral Q breakfast   metoCLOPramide (REGLAN) injection  5 mg  Intravenous Once   oxybutynin  10 mg Oral QHS   pantoprazole (PROTONIX) IV  40 mg Intravenous Q12H   sucralfate  1 g Oral Q6H   thiamine  100 mg Oral Q1200   Vitamin D (Ergocalciferol)  50,000 Units Oral Q7 days  Continuous Infusions:  Diet Order             Diet bariatric advanced Room service appropriate? Yes; Fluid consistency: Thin  Diet effective now                  Intake/Output Summary (Last 24 hours) at 07/28/2023 1654 Last data filed at 07/27/2023 1824 Gross per 24 hour  Intake 180 ml  Output --  Net 180 ml   Net IO Since Admission: 2,720 mL [07/28/23 1654]  Wt Readings from Last 3 Encounters:  07/25/23 61.2 kg  02/28/23 76 kg  01/26/23 75.6 kg     Unresulted Labs (From admission, onward)    None     Data Reviewed: I have personally reviewed following labs and imaging studies CBC: Recent Labs  Lab 07/25/23 0315 07/25/23 0840 07/25/23 1424 07/26/23 0027 07/26/23 0505 07/27/23 0506  WBC 10.9*  --   --  8.0  --  5.7  HGB 12.1 9.8* 9.9* 9.8* 8.8* 9.0*  HCT 38.6 31.0* 32.1* 31.1* 28.3* 30.1*  MCV 92.1  --   --  94.0  --  97.7  PLT 405*  --   --  280  --  259   Basic Metabolic Panel: Recent Labs  Lab 07/25/23 0315 07/26/23 0027 07/27/23 0506  NA 141 139 140  K 3.6 3.7 3.9  CL 106 108 107  CO2 27 25 25   GLUCOSE 118* 73 74  BUN 25* 15 12  CREATININE 0.65 0.63 0.61  CALCIUM 9.4 8.7* 9.0   GFR: Estimated Creatinine Clearance: 85.8 mL/min (by C-G formula based on SCr of 0.61 mg/dL). Liver Function Tests: Recent Labs  Lab 07/25/23 0315  AST 15  ALT 17  ALKPHOS 67  BILITOT 0.6  PROT 7.2  ALBUMIN 3.6   Recent Labs  Lab 07/25/23 0315  LIPASE 30   No results found for this or any previous visit (from the past 240 hour(s)).  Antimicrobials: Anti-infectives (From admission, onward)    None      Culture/Microbiology    Component Value Date/Time   SDES URINE, RANDOM 06/14/2017 0833   SPECREQUEST NONE 06/14/2017 0833   CULT (A)  06/14/2017 0833    >=100,000 COLONIES/mL ESCHERICHIA COLI Confirmed Extended Spectrum Beta-Lactamase Producer (ESBL)    REPTSTATUS 06/18/2017 FINAL 06/14/2017 9147   Radiology Studies: No results found.   LOS: 1 day   Lanae Boast, MD [facility]  07/28/2023, 4:54 PM

## 2023-07-29 DIAGNOSIS — K922 Gastrointestinal hemorrhage, unspecified: Secondary | ICD-10-CM | POA: Diagnosis not present

## 2023-07-29 MED ORDER — HYDROMORPHONE HCL 2 MG PO TABS
2.0000 mg | ORAL_TABLET | Freq: Three times a day (TID) | ORAL | Status: DC | PRN
  Administered 2023-07-29: 2 mg via ORAL
  Filled 2023-07-29 (×2): qty 1

## 2023-07-29 MED ORDER — MIDODRINE HCL 5 MG PO TABS
5.0000 mg | ORAL_TABLET | Freq: Three times a day (TID) | ORAL | Status: DC
  Administered 2023-07-29 – 2023-07-31 (×6): 5 mg via ORAL
  Filled 2023-07-29 (×6): qty 1

## 2023-07-29 MED ORDER — SODIUM CHLORIDE 0.9 % IV BOLUS
500.0000 mL | Freq: Once | INTRAVENOUS | Status: AC
  Administered 2023-07-29: 500 mL via INTRAVENOUS

## 2023-07-29 NOTE — Progress Notes (Signed)
4 Days Post-Op   Subjective/Chief Complaint: Still having some blood pressure issues Still with some burning pain otherwise tolerating po   Objective: Vital signs in last 24 hours: Temp:  [97.5 F (36.4 C)-98.9 F (37.2 C)] 97.5 F (36.4 C) (10/31 0556) Pulse Rate:  [70-102] 74 (10/31 0556) Resp:  [15-20] 16 (10/31 0556) BP: (72-108)/(54-70) 104/65 (10/31 0556) SpO2:  [98 %-100 %] 100 % (10/31 0556) Last BM Date : 07/25/23  Intake/Output from previous day: 10/30 0701 - 10/31 0700 In: 920 [P.O.:920] Out: -  Intake/Output this shift: No intake/output data recorded.  Exam: Awake and alert Abdomen soft, non-tender   Lab Results:  Recent Labs    07/27/23 0506  WBC 5.7  HGB 9.0*  HCT 30.1*  PLT 259   BMET Recent Labs    07/27/23 0506  NA 140  K 3.9  CL 107  CO2 25  GLUCOSE 74  BUN 12  CREATININE 0.61  CALCIUM 9.0   PT/INR No results for input(s): "LABPROT", "INR" in the last 72 hours. ABG No results for input(s): "PHART", "HCO3" in the last 72 hours.  Invalid input(s): "PCO2", "PO2"  Studies/Results: No results found.  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 Johna Sheriff -the patient has been discussed with 2 of our bariatric surgeons.  It is felt that her pouch is likely too big contributing to her ulcer not healing despite maximum medical therapy.  She has currently been on carafate slurry and protonix for over a year now along with bentyl.   -she is now having some bleeding secondary to this ulcer. -we will continue carafate and at DC change to omeprazole slurry.  We will get outpatient follow up for her to discussion pouch and GJ revision given she has had maximum medical therapy with persistent non-healing.  Follow up arranged with our bariatric surgeons We will sign off for now    Abigail Miyamoto  MD 07/29/2023

## 2023-07-29 NOTE — Progress Notes (Signed)
PROGRESS NOTE [name]  WUJ:[id number] DOB: Aug 23, 1976 DOA: 07/25/2023 PCP: Felix Pacini, FNP  Brief Narrative/Hospital Course: 47 y.o. female with medical history significant for anxiety/depression, Roux-en-Y gastric bypass, GERD admitted with complaint of abdominal pain nausea and some episodes of vomiting with bright red blood and large amount of bright red clots  and admitted to the hospital for upper GI bleed.  Patient is admitted GI consulted EGD 10/27 >> Patent Billroth I gastroduodenostomy was found,characterized by ulceration, Oozing gastric ulcers with a visible vessel.Treated with bipolar cautery.  General surgery consulted as this ulcer has been present for 6 months despite optimal medical therapy with PPI and Carafate. Diet advanced to bariatric advanced  diet    Subjective: Patient seen examined  She is again orthostatic today given IV and bolus and midodrine, her stomach hurts when she eats.    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  At this point hemoglobin has been stable overall doing well.Seen by surgery.  Diet advanced to bariatric advance diet-having difficulty tolerating it, per surgery once able to tolerate that she can be discharged home.  She will continue on PPI slurry  Further planning for outpatient follow-up for her to discuss about pouch and GJ revision.  Orthostatic hypotension/dizziness: Given iv bolus, x2 , added compression stockings and midodrine, orthostatic vitals on recheck improved, patient's dizziness is much better.  At this time continue to follow outpatient advise compression stocking.   Anxiety/depression: On Wellbutrin BuSpar Cymbalta Klonopin continue as  tolerated PRN.  Mood stable  Chronic migraine: Continue pain management-atogepant daily, with Imitrex as needed   Leukocytosis mild, monitor Thrombocytosis likely due #1.  Resolved  DVT prophylaxis: SCDs Start: 07/25/23 0743 Code Status:   Code Status: Full Code Family Communication: plan of care discussed with patient at bedside. Patient status is: Inpatient due to diet intolerance orthostasis Level of care: Med-Surg   Dispo: The patient is from: home            Anticipated disposition: home once tolerating diet.   Objective: Vitals last 24 hrs: Vitals:   07/29/23 0556 07/29/23 0932 07/29/23 1222 07/29/23 1300  BP: 104/65 107/71 112/69   Pulse: 74 76 72   Resp: 16  14   Temp: (!) 97.5 F (36.4 C) 98.1 F (36.7 C)  99.1 F (37.3 C)  TempSrc: Axillary Oral  Oral  SpO2: 100% 100% 100% 100%  Weight:      Height:       Weight change:   Physical Examination: General exam: alert awake, thin frail ill appearing HEENT:Oral mucosa moist, Ear/Nose WNL grossly Respiratory system: Bilaterally clear BS,no use of accessory muscle Cardiovascular system: S1 & S2 +, No JVD. Gastrointestinal system: Abdomen soft,NT,ND, BS+ Nervous System: Alert, awake, moving all extremities,and following commands. Extremities: LE edema neg,distal peripheral pulses palpable and warm.  Skin: No rashes,no icterus. MSK: Normal muscle bulk,tone, power   Medications reviewed:  Scheduled Meds:  Atogepant  30 mg Oral Daily   atorvastatin  40 mg Oral Daily   buPROPion  450 mg Oral q morning   busPIRone  15 mg Oral TID   cyanocobalamin  1,000 mcg Intramuscular Once per day on Monday Thursday   DULoxetine  60 mg Oral BID  famotidine  20 mg Oral Daily   gabapentin  600 mg Oral TID   linaclotide  290 mcg Oral QAC breakfast   mesalamine  2.4 g Oral Q breakfast   metoCLOPramide (REGLAN) injection  5 mg Intravenous Once   midodrine  5 mg Oral TID WC   oxybutynin  10 mg Oral QHS   pantoprazole (PROTONIX)  IV  40 mg Intravenous Q12H   sucralfate  1 g Oral Q6H   thiamine  100 mg Oral Q1200   Vitamin D (Ergocalciferol)  50,000 Units Oral Q7 days  Continuous Infusions:  Diet Order             Diet bariatric advanced Room service appropriate? Yes; Fluid consistency: Thin  Diet effective now                  Intake/Output Summary (Last 24 hours) at 07/29/2023 1358 Last data filed at 07/29/2023 0800 Gross per 24 hour  Intake 420 ml  Output --  Net 420 ml   Net IO Since Admission: 3,700 mL [07/29/23 1358]  Wt Readings from Last 3 Encounters:  07/25/23 61.2 kg  02/28/23 76 kg  01/26/23 75.6 kg     Unresulted Labs (From admission, onward)    None     Data Reviewed: I have personally reviewed following labs and imaging studies CBC: Recent Labs  Lab 07/25/23 0315 07/25/23 0840 07/25/23 1424 07/26/23 0027 07/26/23 0505 07/27/23 0506  WBC 10.9*  --   --  8.0  --  5.7  HGB 12.1 9.8* 9.9* 9.8* 8.8* 9.0*  HCT 38.6 31.0* 32.1* 31.1* 28.3* 30.1*  MCV 92.1  --   --  94.0  --  97.7  PLT 405*  --   --  280  --  259   Basic Metabolic Panel: Recent Labs  Lab 07/25/23 0315 07/26/23 0027 07/27/23 0506  NA 141 139 140  K 3.6 3.7 3.9  CL 106 108 107  CO2 27 25 25   GLUCOSE 118* 73 74  BUN 25* 15 12  CREATININE 0.65 0.63 0.61  CALCIUM 9.4 8.7* 9.0   GFR: Estimated Creatinine Clearance: 85.8 mL/min (by C-G formula based on SCr of 0.61 mg/dL). Liver Function Tests: Recent Labs  Lab 07/25/23 0315  AST 15  ALT 17  ALKPHOS 67  BILITOT 0.6  PROT 7.2  ALBUMIN 3.6   Recent Labs  Lab 07/25/23 0315  LIPASE 30   No results found for this or any previous visit (from the past 240 hour(s)).  Antimicrobials: Anti-infectives (From admission, onward)    None      Culture/Microbiology    Component Value Date/Time   SDES URINE, RANDOM 06/14/2017 0833   SPECREQUEST NONE 06/14/2017 0833   CULT (A) 06/14/2017 0833    >=100,000 COLONIES/mL ESCHERICHIA COLI Confirmed  Extended Spectrum Beta-Lactamase Producer (ESBL)    REPTSTATUS 06/18/2017 FINAL 06/14/2017 1610   Radiology Studies: No results found.   LOS: 2 days   Lanae Boast, MD [facility]  07/29/2023, 1:58 PM

## 2023-07-29 NOTE — Plan of Care (Signed)
  Problem: Education: Goal: Knowledge of General Education information will improve Description: Including pain rating scale, medication(s)/side effects and non-pharmacologic comfort measures Outcome: Progressing   Problem: Clinical Measurements: Goal: Diagnostic test results will improve Outcome: Progressing   Problem: Activity: Goal: Risk for activity intolerance will decrease Outcome: Progressing   Problem: Elimination: Goal: Will not experience complications related to urinary retention Outcome: Progressing   Problem: Pain Management: Goal: General experience of comfort will improve Outcome: Progressing   Problem: Safety: Goal: Ability to remain free from injury will improve Outcome: Progressing

## 2023-07-30 DIAGNOSIS — K922 Gastrointestinal hemorrhage, unspecified: Secondary | ICD-10-CM | POA: Diagnosis not present

## 2023-07-30 LAB — CBC
HCT: 33.6 % — ABNORMAL LOW (ref 36.0–46.0)
Hemoglobin: 10 g/dL — ABNORMAL LOW (ref 12.0–15.0)
MCH: 29.2 pg (ref 26.0–34.0)
MCHC: 29.8 g/dL — ABNORMAL LOW (ref 30.0–36.0)
MCV: 98 fL (ref 80.0–100.0)
Platelets: 337 10*3/uL (ref 150–400)
RBC: 3.43 MIL/uL — ABNORMAL LOW (ref 3.87–5.11)
RDW: 16.1 % — ABNORMAL HIGH (ref 11.5–15.5)
WBC: 5.9 10*3/uL (ref 4.0–10.5)
nRBC: 0 % (ref 0.0–0.2)

## 2023-07-30 LAB — BASIC METABOLIC PANEL
Anion gap: 8 (ref 5–15)
BUN: 8 mg/dL (ref 6–20)
CO2: 25 mmol/L (ref 22–32)
Calcium: 9.3 mg/dL (ref 8.9–10.3)
Chloride: 108 mmol/L (ref 98–111)
Creatinine, Ser: 0.61 mg/dL (ref 0.44–1.00)
GFR, Estimated: >60 mL/min (ref >60)
Glucose, Bld: 95 mg/dL (ref 70–99)
Potassium: 4 mmol/L (ref 3.5–5.1)
Sodium: 141 mmol/L (ref 135–145)

## 2023-07-30 LAB — CORTISOL: Cortisol, Plasma: 1.9 ug/dL

## 2023-07-30 MED ORDER — SODIUM CHLORIDE 0.9 % IV BOLUS
500.0000 mL | Freq: Once | INTRAVENOUS | Status: AC
  Administered 2023-07-30: 500 mL via INTRAVENOUS

## 2023-07-30 MED ORDER — COSYNTROPIN 0.25 MG IJ SOLR
0.2500 mg | Freq: Once | INTRAMUSCULAR | Status: AC
  Administered 2023-07-31: 0.25 mg via INTRAVENOUS
  Filled 2023-07-30: qty 0.25

## 2023-07-30 MED ORDER — HYDROMORPHONE HCL 2 MG PO TABS
4.0000 mg | ORAL_TABLET | ORAL | Status: DC | PRN
  Administered 2023-07-30 – 2023-08-05 (×18): 4 mg via ORAL
  Filled 2023-07-30 (×18): qty 2

## 2023-07-30 NOTE — TOC Initial Note (Signed)
Transition of Care [facility]) - Initial/Assessment Note    Patient Details  Name: [name] MRN: [id number] Date of Birth: 06-14-1976  [facility]) CM/SW Contact:    Adrian Prows, RN Phone Number: 07/30/2023, 12:02 PM  Clinical Narrative:                 TOC for d/c planning; spoke w/ pt in room; pt says she lives at home w/ her 8 and 45 year old children; pt verified she has insurance and PCP; she identified POC mother Karishma Unrein [phone]); pt says she has transportation; pt says she struggles to pay for food, housing, and utilities; she mad a payment arrangement for her utilities; she says they are able to stretch food; pt says she has cane, and walker; she has upper dentures and glasses; pt says she does not have HH services or home oxygen; pt agrees to receive resources; resources placed in d/c instructions: [facility] [employer] in [place], [facility] [employer] [facility] [employer] in [place]; guide to social services, and financial assistance; copies also given to pt; she will contact agencies of choice; no TOC needs; please place consult if needed.    Expected Discharge Plan: Home/Self Care Barriers to Discharge: No Barriers Identified   Patient Goals and CMS Choice Patient states their goals for this hospitalization and ongoing recovery are:: home          Expected Discharge Plan and Services   Discharge Planning Services: CM Consult   Living arrangements for the past 2 months: Apartment                                      Prior Living Arrangements/Services Living arrangements for the past 2 months: Apartment Lives with:: Minor Children Patient language and need for interpreter reviewed:: Yes Do you feel safe going back to the place where you live?: Yes      Need for Family Participation in Patient Care: Yes (Comment) Care giver support system in place?: Yes (comment) Current home services: DME  (cane) Criminal Activity/Legal Involvement Pertinent to Current Situation/Hospitalization: No - Comment as needed  Activities of Daily Living   ADL Screening (condition at time of admission) Independently performs ADLs?: Yes (appropriate for developmental age) Is the patient deaf or have difficulty hearing?: No Does the patient have difficulty seeing, even when wearing glasses/contacts?: No Does the patient have difficulty concentrating, remembering, or making decisions?: No  Permission Sought/Granted Permission sought to share information with : Case Manager Permission granted to share information with : Yes, Verbal Permission Granted  Share Information with NAME: Case Manager     Permission granted to share info w Relationship: Jaria Conway (mother) [phone]     Emotional Assessment Appearance:: Appears stated age Attitude/Demeanor/Rapport: Gracious Affect (typically observed): Accepting Orientation: : Oriented to Self, Oriented to Place, Oriented to  Time, Oriented to Situation Alcohol / Substance Use: Not Applicable Psych Involvement: No (comment)  Admission diagnosis:  Upper GI bleed [K92.2] Hematemesis, unspecified whether nausea present [K92.0] Patient Active Problem List   Diagnosis Date Noted   Upper GI bleed 07/25/2023   Abdominal pain with vomiting 01/26/2023   Depression with anxiety 01/26/2023   Enteritis 01/26/2023   Malnutrition of moderate degree (HCC) 01/20/2023   Normocytic anemia 01/20/2023   Intractable nausea and vomiting 01/18/2023   Intractable abdominal pain 01/17/2023   Pituitary microadenoma (HCC)  06/11/2022   Yeast infection 02/07/2020   History of urinary urgency 02/07/2020   Urinary frequency 02/07/2020   Dysuria 02/07/2020   Chronic migraine without aura, with intractable migraine, so stated, with status migrainosus 02/05/2020   S/P right TKA 01/23/2020   Status post total knee replacement, right 01/23/2020   Acquired spondylolysis  10/27/2019   Chronic low back pain 10/25/2019   Headache 05/04/2019   Gait instability 05/04/2019   Suicidal ideations    MDD (major depressive disorder), recurrent severe, 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   Peripheral neuropathy 03/01/2019   Right carpal tunnel syndrome 03/01/2019   Chronic constipation 01/26/2019   S/P left TKA 03/10/2018   B12 deficiency 12/01/2017   Malnutrition, calorie (HCC) 03/27/2017   Psychophysiological insomnia 03/18/2017   Paresthesia 07/22/2016   Chronic headache disorder 07/22/2016   Memory difficulty 07/22/2016   Upper GI bleeding 04/04/2016   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 [place] [address] Phone: [phone] Fax: [phone]  [facility] - [place], [place] - [address] [place] [address] Phone: [phone] Fax: [phone]     Social Determinants of Health (SDOH) Social History: SDOH Screenings   Food Insecurity: Food Insecurity Present (07/30/2023)  Housing: Low Risk  (07/30/2023)  Transportation Needs: No Transportation Needs (07/30/2023)  Utilities: At Risk (07/30/2023)  Alcohol Screen: Medium Risk (04/08/2019)  Depression (PHQ2-9): Low Risk  (01/05/2020)  Financial Resource Strain: Low Risk  (04/08/2019)  Physical Activity: Inactive (04/08/2019)  Social Connections: Unknown (01/29/2022)   Received from [facility]  Stress: Stress Concern Present (04/08/2019)   Tobacco Use: Medium Risk (07/25/2023)   SDOH Interventions: Food Insecurity Interventions: Inpatient TOC (resources given) Housing Interventions: Intervention Not Indicated, Inpatient TOC Transportation Interventions: Intervention Not Indicated, Inpatient TOC Utilities Interventions: Inpatient TOC (resources given)   Readmission Risk Interventions    07/30/2023   11:59 AM  Readmission Risk Prevention Plan  Transportation Screening Complete  PCP or Specialist Appt within 5-7 Days Complete  Home Care Screening Complete  Medication Review (RN CM) Complete

## 2023-07-30 NOTE — Progress Notes (Signed)
Pt requesting home dilaudid dose of 4mg  TID despite 2mg  TID ordered. I requested the MD resume pt's home dose, and while waiting for the order modification, pt commented that she could take her home dilaudid that was at bedside with her belongings. I instructed pt to not take the med and advised that it should be returned home if possible. MD aware.

## 2023-07-30 NOTE — Plan of Care (Signed)
  Problem: Education: Goal: Knowledge of General Education information will improve Description: Including pain rating scale, medication(s)/side effects and non-pharmacologic comfort measures Outcome: Progressing   Problem: Clinical Measurements: Goal: Ability to maintain clinical measurements within normal limits will improve Outcome: Progressing Goal: Diagnostic test results will improve Outcome: Progressing   Problem: Activity: Goal: Risk for activity intolerance will decrease Outcome: Progressing   Problem: Nutrition: Goal: Adequate nutrition will be maintained Outcome: Progressing   Problem: Pain Management: Goal: General experience of comfort will improve Outcome: Progressing   Problem: Safety: Goal: Ability to remain free from injury will improve Outcome: Progressing

## 2023-07-31 DIAGNOSIS — K922 Gastrointestinal hemorrhage, unspecified: Secondary | ICD-10-CM | POA: Diagnosis not present

## 2023-07-31 LAB — ACTH STIMULATION, 3 TIME POINTS
Cortisol, 30 Min: 15.5 ug/dL
Cortisol, 60 Min: 20.8 ug/dL
Cortisol, Base: 1.1 ug/dL

## 2023-07-31 MED ORDER — MIDODRINE HCL 5 MG PO TABS
10.0000 mg | ORAL_TABLET | Freq: Three times a day (TID) | ORAL | Status: DC
  Administered 2023-07-31 – 2023-08-02 (×7): 10 mg via ORAL
  Filled 2023-07-31 (×7): qty 2

## 2023-07-31 MED ORDER — ENSURE ENLIVE PO LIQD
237.0000 mL | Freq: Two times a day (BID) | ORAL | Status: DC
  Administered 2023-08-01 – 2023-08-05 (×8): 237 mL via ORAL

## 2023-07-31 MED ORDER — PANTOPRAZOLE SODIUM 40 MG PO TBEC
40.0000 mg | DELAYED_RELEASE_TABLET | Freq: Two times a day (BID) | ORAL | Status: DC
  Administered 2023-07-31 – 2023-08-05 (×10): 40 mg via ORAL
  Filled 2023-07-31 (×10): qty 1

## 2023-07-31 MED ORDER — SODIUM CHLORIDE 0.9 % IV BOLUS
500.0000 mL | Freq: Once | INTRAVENOUS | Status: AC
  Administered 2023-07-31: 500 mL via INTRAVENOUS

## 2023-07-31 NOTE — Progress Notes (Signed)
RN gave report about patient to the 5W Floor (room [phone]) RN, who will be resuming patient's care for the remainder of the shift. Patient was stable upon transfer to Room 1538 and had all personal belongings. Patient was transported by 4th floor Nurse Tech.

## 2023-07-31 NOTE — Plan of Care (Signed)
  Problem: Education: Goal: Knowledge of General Education information will improve Description: Including pain rating scale, medication(s)/side effects and non-pharmacologic comfort measures Outcome: Progressing   Problem: Activity: Goal: Risk for activity intolerance will decrease Outcome: Progressing   Problem: Nutrition: Goal: Adequate nutrition will be maintained Outcome: Progressing   Problem: Coping: Goal: Level of anxiety will decrease Outcome: Progressing   Problem: Elimination: Goal: Will not experience complications related to bowel motility Outcome: Progressing Goal: Will not experience complications related to urinary retention Outcome: Progressing   Problem: Pain Management: Goal: General experience of comfort will improve Outcome: Progressing   Problem: Safety: Goal: Ability to remain free from injury will improve Outcome: Progressing   Problem: Skin Integrity: Goal: Risk for impaired skin integrity will decrease Outcome: Progressing

## 2023-07-31 NOTE — Progress Notes (Signed)
IV team consulted to assess for IV access. Discussed need for access with MD Ramesh. To hold on obtaining new IV access at this time for vein preservation and return if access needed. Primary RN aware.

## 2023-07-31 NOTE — Progress Notes (Signed)
PROGRESS NOTE [name]  ZOX:[id number] DOB: [birth year] DOA: 07/25/2023 PCP: Felix Pacini, FNP  Brief Narrative/Hospital Course: 47 y.o. female with medical history significant for anxiety/depression, Roux-en-Y gastric bypass, GERD admitted with complaint of abdominal pain nausea and some episodes of vomiting with bright red blood and large amount of bright red clots  and admitted to the hospital for upper GI bleed.  Patient is admitted GI consulted EGD 10/27 >> Patent Billroth I gastroduodenostomy was found,characterized by ulceration, Oozing gastric ulcers with a visible vessel.Treated with bipolar cautery.  General surgery consulted as this ulcer has been present for 6 months despite optimal medical therapy with PPI and Carafate.Diet advanced to bariatric advanced  diet. Patient was found to have orthostatic hypotension and dizziness iV fluid bolus was given multiple times, placed on midodrine, compression hose.  Patient continued to be orthostatic, cosyntropin test ordered 11/2.  At this time she is tolerating diet and largely plan is unchanged for GI issues she will need to continue on PPI slurry and follow-up with general surgery as outpatient. 11/2 Cosyntropin test ordered with persistent orthostatic hypotension. 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  Marginal ulcer present for at least 6 months despite optimal medical therapy.  At this point hemoglobin has been stable.Diet advanced to bariatric advance diet-able to eat some, mostly  drinking liquid.  She will continue on PPI slurry. Further planning for outpatient follow-up for her to discuss about pouch and GJ revision.  Orthostatic hypotension/dizziness: Needed multiple Iv boluses 10/31 and again overnight 11/1.again orthostatic vital positive increase midodrine to 10 mg 3 times daily, cosyntropin test pending  this am.  Continue fall precaution, minimize narcotics she is on high-dose Dilaudid at home. Lost iv-if cosyntropin test negative we will try IV fluid bolus again   Anxiety/depression: Stable on home Wellbutrin BuSpar Cymbalta Klonopin continue as tolerated PRN.  Mood stable  Chronic migraine: Continue pain management AND opiates at home  Leukocytosis mild, monitor Thrombocytosis likely due #1.  Resolved.  Nursing concerned that patient may be medicating herself, patient advised not to use home narcotics. Pharmacy verified opiate dose.  DVT prophylaxis: SCDs Start: 07/25/23 0743 Code Status:   Code Status: Full Code Family Communication: plan of care discussed with patient at bedside. Patient status is: Inpatient due to diet intolerance orthostasis Level of care: Med-Surg   Dispo: The patient is from: home            Anticipated disposition: home once tolerating diet/orthostatic vitals improves   Objective: Vitals last 24 hrs: Vitals:   07/31/23 0520 07/31/23 1046 07/31/23 1049 07/31/23 1051  BP: 110/66 108/63 103/61 (!) 84/55  Pulse: 68 73 75 88  Resp:  16    Temp:  98.9 F (37.2 C)    TempSrc:  Oral    SpO2:  100% 100% 100%  Weight:      Height:       Weight change:   Physical Examination: General exam: alert awake thin HEENT:Oral mucosa moist, Ear/Nose WNL grossly Respiratory system: Bilaterally clear  BS,no use of accessory muscle Cardiovascular system: S1 & S2 +, No JVD. Gastrointestinal system: Abdomen soft,NT,ND, BS+ Nervous System: Alert, awake, moving all extremities,and following commands. Extremities: LE edema neg,distal  peripheral pulses palpable and warm.  Skin: No rashes,no icterus. MSK: Normal muscle bulk,tone, power   Medications reviewed:  Scheduled Meds:  Atogepant  30 mg Oral Daily   atorvastatin  40 mg Oral Daily   buPROPion  450 mg Oral q morning   busPIRone  15 mg Oral TID   cyanocobalamin  1,000 mcg Intramuscular Once per day on Monday Thursday   DULoxetine  60 mg Oral BID   famotidine  20 mg Oral Daily   gabapentin  600 mg Oral TID   linaclotide  290 mcg Oral QAC breakfast   mesalamine  2.4 g Oral Q breakfast   metoCLOPramide (REGLAN) injection  5 mg Intravenous Once   midodrine  10 mg Oral TID WC   oxybutynin  10 mg Oral QHS   pantoprazole  40 mg Oral BID   sucralfate  1 g Oral Q6H   thiamine  100 mg Oral Q1200   Vitamin D (Ergocalciferol)  50,000 Units Oral Q7 days  Continuous Infusions:  Diet Order             Diet bariatric advanced Room service appropriate? Yes; Fluid consistency: Thin  Diet effective now                  Intake/Output Summary (Last 24 hours) at 07/31/2023 1200 Last data filed at 07/30/2023 1536 Gross per 24 hour  Intake 1100 ml  Output --  Net 1100 ml   Net IO Since Admission: 5,390 mL [07/31/23 1200]  Wt Readings from Last 3 Encounters:  07/25/23 61.2 kg  02/28/23 76 kg  01/26/23 75.6 kg     Unresulted Labs (From admission, onward)     Start     Ordered   07/31/23 0500  ACTH stimulation, 3 time points (baseline, 30 min, 60 min)  (cosyntropin (CORTROSYN) test and labs)  Tomorrow morning,   R       Comments: First sample: Draw before administering Cosyntropin Second sample: Draw 30 minutes after administering Cosyntropin Third sample: Draw 60 minutes after administering Cosyntropin.   Question:  Specimen collection method  Answer:  Lab=Lab collect   07/30/23 1257          Data Reviewed: I have personally reviewed following labs and imaging studies CBC: Recent Labs  Lab 07/25/23 0315 07/25/23 0840 07/25/23 1424 07/26/23 0027  07/26/23 0505 07/27/23 0506 07/30/23 1004  WBC 10.9*  --   --  8.0  --  5.7 5.9  HGB 12.1   < > 9.9* 9.8* 8.8* 9.0* 10.0*  HCT 38.6   < > 32.1* 31.1* 28.3* 30.1* 33.6*  MCV 92.1  --   --  94.0  --  97.7 98.0  PLT 405*  --   --  280  --  259 337   < > = values in this interval not displayed.   Basic Metabolic Panel: Recent Labs  Lab 07/25/23 0315 07/26/23 0027 07/27/23 0506 07/30/23 1004  NA 141 139 140 141  K 3.6 3.7 3.9 4.0  CL 106 108 107 108  CO2 27 25 25 25   GLUCOSE 118* 73 74 95  BUN 25* 15 12 8   CREATININE 0.65 0.63 0.61 0.61  CALCIUM 9.4 8.7* 9.0 9.3   GFR: Estimated Creatinine Clearance: 85.8 mL/min (by C-G formula based on SCr  of 0.61 mg/dL). Liver Function Tests: Recent Labs  Lab 07/25/23 0315  AST 15  ALT 17  ALKPHOS 67  BILITOT 0.6  PROT 7.2  ALBUMIN 3.6   Recent Labs  Lab 07/25/23 0315  LIPASE 30   No results found for this or any previous visit (from the past 240 hour(s)).  Antimicrobials: Anti-infectives (From admission, onward)    None      Culture/Microbiology    Component Value Date/Time   SDES URINE, RANDOM 06/14/2017 0833   SPECREQUEST NONE 06/14/2017 0833   CULT (A) 06/14/2017 0833    >=100,000 COLONIES/mL ESCHERICHIA COLI Confirmed Extended Spectrum Beta-Lactamase Producer (ESBL)    REPTSTATUS 06/18/2017 FINAL 06/14/2017 1610   Radiology Studies: No results found.   LOS: 4 days   Lanae Boast, MD [facility]  07/31/2023, 12:00 PM

## 2023-07-31 NOTE — Plan of Care (Signed)
  Problem: Clinical Measurements: Goal: Ability to maintain clinical measurements within normal limits will improve Outcome: Progressing Goal: Diagnostic test results will improve Outcome: Progressing Goal: Respiratory complications will improve Outcome: Progressing Goal: Cardiovascular complication will be avoided Outcome: Progressing   

## 2023-08-01 DIAGNOSIS — K922 Gastrointestinal hemorrhage, unspecified: Secondary | ICD-10-CM | POA: Diagnosis not present

## 2023-08-01 MED ORDER — SODIUM CHLORIDE 0.9 % IV SOLN: INTRAVENOUS | Status: AC

## 2023-08-01 MED ORDER — NALDEMEDINE TOSYLATE 0.2 MG PO TABS
0.2000 mg | ORAL_TABLET | Freq: Every day | ORAL | Status: DC
  Administered 2023-08-02 – 2023-08-05 (×4): 0.2 mg via ORAL
  Filled 2023-08-01 (×4): qty 1

## 2023-08-01 NOTE — Progress Notes (Signed)
PROGRESS NOTE [name]  ZHY:[id number] DOB: Dec 08, 1975 DOA: 07/25/2023 PCP: Felix Pacini, FNP  Brief Narrative/Hospital Course: 47 y.o. female with medical history significant for anxiety/depression, Roux-en-Y gastric bypass, GERD admitted with complaint of abdominal pain nausea and some episodes of vomiting with bright red blood and large amount of bright red clots  and admitted to the hospital for upper GI bleed.  Patient is admitted GI consulted EGD 10/27 >> Patent Billroth I gastroduodenostomy was found,characterized by ulceration, Oozing gastric ulcers with a visible vessel.Treated with bipolar cautery.  General surgery consulted as this ulcer has been present for 6 months despite optimal medical therapy with PPI and Carafate.Diet advanced to bariatric advanced  diet. Patient was found to have orthostatic hypotension and dizziness iV fluid bolus was given multiple times, placed on midodrine, compression hose.  Patient continued to be orthostatic, cosyntropin test ordered 11/2.  At this time she is tolerating diet and largely plan is unchanged for GI issues she will need to continue on PPI slurry and follow-up with general surgery as outpatient. 11/2 Cosyntropin test ordered with persistent orthostatic hypotension. 11/2 Midodrine dose increased to 10 mg 3 times daily     Subjective: Patient seen and examined.  She is laying in the bed and saying that she still has dizziness even when laying in the bed, much worse than her baseline at home, with no improvement with increasing to midodrine.  She does not feel comfortable going home.  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  At this point hemoglobin has  been stable overall doing well.Seen by surgery.  Diet advanced to bariatric advance diet-having difficulty tolerating it, per surgery once able to tolerate that she can be discharged home.  She will continue on PPI slurry  Further planning for outpatient follow-up for her to discuss about pouch and GJ revision.  Orthostatic hypotension/dizziness: Despite of getting several boluses, compression stockings, and increasing midodrine, she continues to feel dizzy.  Will check orthostatics every shift, will give her another IV fluid normal saline at 150 cc/h for next 10 hours.  ACTH stimulation test and cortisol level within normal limit.   Anxiety/depression: On Wellbutrin BuSpar Cymbalta Klonopin continue as tolerated PRN.  Mood stable  Chronic migraine: Continue pain management-atogepant daily, with Imitrex as needed   Leukocytosis mild, monitor Thrombocytosis likely due #1.  Resolved  DVT prophylaxis: SCDs Start: 07/25/23 0743 Code Status:   Code Status: Full Code Family Communication: plan of care discussed with patient at bedside. Patient status is: Inpatient due to diet intolerance orthostasis Level of care: Med-Surg   Dispo: The patient is from: home            Anticipated disposition: home once tolerating diet.   Objective: Vitals last 24 hrs: Vitals:   07/31/23 1051 07/31/23 1459 07/31/23 2131 08/01/23 0600  BP: (!) 84/55 107/64 106/62 98/62  Pulse: 88 64 68 78  Resp:  16 17 16   Temp:  98.3 F (36.8 C) 98.2 F (36.8 C) 98.5 F (36.9 C)  TempSrc:  Oral Oral Oral  SpO2: 100% 100%    Weight:      Height:       Weight change:   Physical Examination: General exam: Appears calm and comfortable  Respiratory system: Clear to auscultation. Respiratory effort normal. Cardiovascular system: S1 & S2 heard, RRR. No JVD, murmurs, rubs, gallops or clicks. No pedal edema. Gastrointestinal system: Abdomen is nondistended, soft and nontender. No organomegaly or masses felt. Normal  bowel sounds heard. Central nervous system: Alert and oriented. No focal neurological deficits. Extremities: Symmetric 5 x 5 power. Skin: No rashes, lesions or ulcers.  Psychiatry: Judgement and insight appear normal. Mood & affect appropriate.   Medications reviewed:  Scheduled Meds:  Atogepant  30 mg Oral Daily   atorvastatin  40 mg Oral Daily   buPROPion  450 mg Oral q morning   busPIRone  15 mg Oral TID   cyanocobalamin  1,000 mcg Intramuscular Once per day on Monday Thursday   DULoxetine  60 mg Oral BID   famotidine  20 mg Oral Daily   feeding supplement  237 mL Oral BID BM   gabapentin  600 mg Oral TID   linaclotide  290 mcg Oral QAC breakfast   mesalamine  2.4 g Oral Q breakfast   metoCLOPramide (REGLAN) injection  5 mg Intravenous Once   midodrine  10 mg Oral TID WC   oxybutynin  10 mg Oral QHS   pantoprazole  40 mg Oral BID   sucralfate  1 g Oral Q6H   thiamine  100 mg Oral Q1200   Vitamin D (Ergocalciferol)  50,000 Units Oral Q7 days  Continuous Infusions:  Diet Order             Diet Carb Modified Fluid consistency: Thin; Room service appropriate? Yes  Diet effective now                  Intake/Output Summary (Last 24 hours) at 08/01/2023 0815 Last data filed at 07/31/2023 1818 Gross per 24 hour  Intake 980 ml  Output --  Net 980 ml   Net IO Since Admission: 6,370 mL [08/01/23 0815]  Wt Readings from Last 3 Encounters:  07/25/23 61.2 kg  02/28/23 76 kg  01/26/23 75.6 kg     Unresulted Labs (From admission, onward)    None     Data Reviewed: I have personally reviewed following labs and imaging studies CBC: Recent Labs  Lab 07/25/23 1424 07/26/23 0027 07/26/23 0505 07/27/23 0506 07/30/23 1004  WBC  --  8.0  --  5.7 5.9  HGB 9.9* 9.8* 8.8* 9.0* 10.0*  HCT 32.1* 31.1* 28.3* 30.1* 33.6*  MCV  --  94.0  --  97.7 98.0  PLT  --  280  --  259 337   Basic Metabolic Panel: Recent Labs  Lab 07/26/23 0027 07/27/23 0506 07/30/23 1004  NA 139  140 141  K 3.7 3.9 4.0  CL 108 107 108  CO2 25 25 25   GLUCOSE 73 74 95  BUN 15 12 8   CREATININE 0.63 0.61 0.61  CALCIUM 8.7* 9.0 9.3   GFR: Estimated Creatinine Clearance: 85.8 mL/min (by C-G formula based on SCr of 0.61 mg/dL). Liver Function Tests: No results for input(s): "AST", "ALT", "ALKPHOS", "BILITOT", "PROT", "ALBUMIN" in the last 168 hours.  No results for input(s): "LIPASE", "AMYLASE" in the last 168 hours.  No results found for this or any previous visit (from the past 240 hour(s)).  Antimicrobials: Anti-infectives (From admission, onward)    None      Culture/Microbiology    Component Value Date/Time   SDES URINE, RANDOM 06/14/2017 0833   SPECREQUEST NONE 06/14/2017 0833   CULT (A) 06/14/2017 0833    >=  100,000 COLONIES/mL ESCHERICHIA COLI Confirmed Extended Spectrum Beta-Lactamase Producer (ESBL)    REPTSTATUS 06/18/2017 FINAL 06/14/2017 4098   Radiology Studies: No results found.   LOS: 5 days   Hughie Closs, MD [facility]  08/01/2023, 8:15 AM

## 2023-08-01 NOTE — Plan of Care (Signed)
Plan of Care reviewed. 

## 2023-08-02 DIAGNOSIS — K922 Gastrointestinal hemorrhage, unspecified: Secondary | ICD-10-CM | POA: Diagnosis not present

## 2023-08-02 LAB — BASIC METABOLIC PANEL
Anion gap: 5 (ref 5–15)
BUN: 12 mg/dL (ref 6–20)
CO2: 28 mmol/L (ref 22–32)
Calcium: 8.7 mg/dL — ABNORMAL LOW (ref 8.9–10.3)
Chloride: 109 mmol/L (ref 98–111)
Creatinine, Ser: 0.76 mg/dL (ref 0.44–1.00)
GFR, Estimated: >60 mL/min (ref >60)
Glucose, Bld: 80 mg/dL (ref 70–99)
Potassium: 4.1 mmol/L (ref 3.5–5.1)
Sodium: 142 mmol/L (ref 135–145)

## 2023-08-02 LAB — CBC WITH DIFFERENTIAL/PLATELET
Abs Immature Granulocytes: 0.01 10*3/uL (ref 0.00–0.07)
Basophils Absolute: 0 10*3/uL (ref 0.0–0.1)
Basophils Relative: 0 %
Eosinophils Absolute: 0.3 10*3/uL (ref 0.0–0.5)
Eosinophils Relative: 4 %
HCT: 34.6 % — ABNORMAL LOW (ref 36.0–46.0)
Hemoglobin: 10.4 g/dL — ABNORMAL LOW (ref 12.0–15.0)
Immature Granulocytes: 0 %
Lymphocytes Relative: 33 %
Lymphs Abs: 2.6 10*3/uL (ref 0.7–4.0)
MCH: 29.4 pg (ref 26.0–34.0)
MCHC: 30.1 g/dL (ref 30.0–36.0)
MCV: 97.7 fL (ref 80.0–100.0)
Monocytes Absolute: 0.6 10*3/uL (ref 0.1–1.0)
Monocytes Relative: 8 %
Neutro Abs: 4.3 10*3/uL (ref 1.7–7.7)
Neutrophils Relative %: 55 %
Platelets: 413 10*3/uL — ABNORMAL HIGH (ref 150–400)
RBC: 3.54 MIL/uL — ABNORMAL LOW (ref 3.87–5.11)
RDW: 16.3 % — ABNORMAL HIGH (ref 11.5–15.5)
WBC: 7.7 10*3/uL (ref 4.0–10.5)
nRBC: 0 % (ref 0.0–0.2)

## 2023-08-02 MED ORDER — MIDODRINE HCL 5 MG PO TABS
15.0000 mg | ORAL_TABLET | Freq: Three times a day (TID) | ORAL | Status: DC
  Administered 2023-08-02 – 2023-08-05 (×9): 15 mg via ORAL
  Filled 2023-08-02 (×9): qty 3

## 2023-08-02 MED ORDER — SODIUM CHLORIDE 0.9 % IV SOLN: INTRAVENOUS | Status: AC

## 2023-08-02 NOTE — Progress Notes (Signed)
PROGRESS NOTE [name]  [id number] DOB: 15-Jan-1976 DOA: 07/25/2023 PCP: Felix Pacini, FNP  Brief Narrative/Hospital Course: 47 y.o. female with medical history significant for anxiety/depression, Roux-en-Y gastric bypass, GERD admitted with complaint of abdominal pain nausea and some episodes of vomiting with bright red blood and large amount of bright red clots  and admitted to the hospital for upper GI bleed.  Patient is admitted GI consulted EGD 10/27 >> Patent Billroth I gastroduodenostomy was found,characterized by ulceration, Oozing gastric ulcers with a visible vessel.Treated with bipolar cautery.  General surgery consulted as this ulcer has been present for 6 months despite optimal medical therapy with PPI and Carafate.Diet advanced to bariatric advanced  diet. Patient was found to have orthostatic hypotension and dizziness iV fluid bolus was given multiple times, placed on midodrine, compression hose.  Patient continued to be orthostatic, cosyntropin test ordered 11/2.  At this time she is tolerating diet and largely plan is unchanged for GI issues she will need to continue on PPI slurry and follow-up with general surgery as outpatient. 11/2 Cosyntropin test ordered with persistent orthostatic hypotension. 11/2 Midodrine dose increased to 10 mg 3 times daily     Subjective: Patient seen and examined.  She is still complaining of dizziness and was significantly orthostatic positive this morning.  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  At this point hemoglobin has been stable overall doing well.Seen by surgery.  Diet advanced to bariatric advance diet-having difficulty tolerating it, per  surgery once able to tolerate that she can be discharged home.  She will continue on PPI slurry  Further planning for outpatient follow-up for her to discuss about pouch and GJ revision.  Orthostatic hypotension/dizziness: Despite of getting several boluses, compression stockings, and increasing midodrine, she continues to feel dizzy.  Will check orthostatics every shift, will give her another IV fluid normal saline at 150 cc/h for next 10 hours.  ACTH stimulation test and cortisol level within normal limit.  Will increase midodrine to 15 mg 3 times daily.   Anxiety/depression: On Wellbutrin BuSpar Cymbalta Klonopin continue as tolerated PRN.  Mood stable  Chronic migraine: Continue pain management-atogepant daily, with Imitrex as needed   Leukocytosis mild, monitor Thrombocytosis likely due #1.  Resolved  DVT prophylaxis: SCDs Start: 07/25/23 0743 Code Status:   Code Status: Full Code Family Communication: plan of care discussed with patient at bedside. Patient status is: Inpatient due to diet intolerance orthostasis Level of care: Med-Surg   Dispo: The patient is from: home            Anticipated disposition: home once tolerating diet.   Objective: Vitals last 24 hrs: Vitals:   08/01/23 1149 08/01/23 2102 08/02/23 0513 08/02/23 0800  BP: 115/67 137/87 (!) 87/55 116/67  Pulse: 71 (!) 57 95 73  Resp: 16 15 17 16   Temp: 98.8 F (37.1 C) 98.2 F (36.8 C) 98.6 F (37 C) 98.2 F (36.8 C)  TempSrc: Oral   Oral  SpO2: 100% 97% 100% 100%  Weight:      Height:       Weight change:   Physical Examination:  General exam: Appears calm and comfortable  Respiratory system: Clear to auscultation. Respiratory effort normal. Cardiovascular system: S1 &  S2 heard, RRR. No JVD, murmurs, rubs, gallops or clicks. No pedal edema. Gastrointestinal system: Abdomen is nondistended, soft and nontender. No organomegaly or masses felt. Normal bowel sounds heard. Central nervous system: Alert and  oriented. No focal neurological deficits. Extremities: Symmetric 5 x 5 power. Skin: No rashes, lesions or ulcers.  Psychiatry: Judgement and insight appear normal. Mood & affect appropriate.    Medications reviewed:  Scheduled Meds:  Atogepant  30 mg Oral Daily   atorvastatin  40 mg Oral Daily   buPROPion  450 mg Oral q morning   busPIRone  15 mg Oral TID   cyanocobalamin  1,000 mcg Intramuscular Once per day on Monday Thursday   DULoxetine  60 mg Oral BID   famotidine  20 mg Oral Daily   feeding supplement  237 mL Oral BID BM   gabapentin  600 mg Oral TID   linaclotide  290 mcg Oral QAC breakfast   mesalamine  2.4 g Oral Q breakfast   metoCLOPramide (REGLAN) injection  5 mg Intravenous Once   midodrine  10 mg Oral TID WC   Naldemedine Tosylate  0.2 mg Oral Daily   oxybutynin  10 mg Oral QHS   pantoprazole  40 mg Oral BID   sucralfate  1 g Oral Q6H   thiamine  100 mg Oral Q1200   Vitamin D (Ergocalciferol)  50,000 Units Oral Q7 days  Continuous Infusions:  sodium chloride 150 mL/hr at 08/02/23 1120    Diet Order             Diet Carb Modified Fluid consistency: Thin; Room service appropriate? Yes  Diet effective now                  Intake/Output Summary (Last 24 hours) at 08/02/2023 1255 Last data filed at 08/02/2023 0900 Gross per 24 hour  Intake 1277.59 ml  Output 1 ml  Net 1276.59 ml   Net IO Since Admission: 7,646.59 mL [08/02/23 1255]  Wt Readings from Last 3 Encounters:  07/25/23 61.2 kg  02/28/23 76 kg  01/26/23 75.6 kg     Unresulted Labs (From admission, onward)    None     Data Reviewed: I have personally reviewed following labs and imaging studies CBC: Recent Labs  Lab 07/27/23 0506 07/30/23 1004 08/02/23 0859  WBC 5.7 5.9 7.7  NEUTROABS  --   --  4.3  HGB 9.0* 10.0* 10.4*  HCT 30.1* 33.6* 34.6*  MCV 97.7 98.0 97.7  PLT 259 337 413*   Basic Metabolic Panel: Recent Labs  Lab 07/27/23 0506 07/30/23 1004 08/02/23 0326  NA 140 141  142  K 3.9 4.0 4.1  CL 107 108 109  CO2 25 25 28   GLUCOSE 74 95 80  BUN 12 8 12   CREATININE 0.61 0.61 0.76  CALCIUM 9.0 9.3 8.7*   GFR: Estimated Creatinine Clearance: 85.8 mL/min (by C-G formula based on SCr of 0.76 mg/dL). Liver Function Tests: No results for input(s): "AST", "ALT", "ALKPHOS", "BILITOT", "PROT", "ALBUMIN" in the last 168 hours.  No results for input(s): "LIPASE", "AMYLASE" in the last 168 hours.  No results found for this or any previous visit (from the past 240 hour(s)).  Antimicrobials: Anti-infectives (From admission, onward)    None      Culture/Microbiology    Component Value Date/Time   SDES URINE, RANDOM 06/14/2017 0833   SPECREQUEST NONE 06/14/2017 0833   CULT (A) 06/14/2017 0833    >=100,000 COLONIES/mL ESCHERICHIA COLI Confirmed Extended Spectrum  Beta-Lactamase Producer (ESBL)    REPTSTATUS 06/18/2017 FINAL 06/14/2017 1191   Radiology Studies: No results found.   LOS: 6 days   Hughie Closs, MD [facility]  08/02/2023, 12:55 PM

## 2023-08-02 NOTE — Progress Notes (Addendum)
   08/02/23 2011  Vitals  BP 124/77  MAP (mmHg) 90  BP Location Left Arm  BP Method Automatic  Patient Position (if appropriate) Lying  Pulse Rate 61  Pulse Rate Source Monitor  Resp 16  MEWS COLOR  MEWS Score Color Green  Orthostatic Lying   BP- Lying 124/77  Pulse- Lying 61  Orthostatic Sitting  BP- Sitting 102/69  Pulse- Sitting 80  Orthostatic Standing at 0 minutes  BP- Standing at 0 minutes (!) 72/50  Pulse- Standing at 0 minutes 59  Orthostatic Standing at 3 minutes  BP- Standing at 3 minutes (!) 66/43  Pulse- Standing at 3 minutes 104  Oxygen Therapy  SpO2 100 %  O2 Device Room Air  MEWS Score  MEWS Temp 0  MEWS Systolic 0  MEWS Pulse 0  MEWS RR 0  MEWS LOC 0  MEWS Score 0    Pt A&O x 4. Orthostatic vitals done per order. See above readings. c/o dizziness while taking standing BP.  Pt on continuous IV fluids @ 150 ml/hr infusing.  On-call team, J.Garner Nash, NP notified via secure chat. No new orders at this time.  Assisted pt walked to bathroom using walker, able to tolerate. Educated pt on falls and safety precautions, use of call for assistance to bathroom, bed alarm- pt verbalizes understanding, but refused to have bed alarm on.   Call bell in reach. Will continue to monitor.

## 2023-08-02 NOTE — Plan of Care (Signed)

## 2023-08-02 NOTE — Progress Notes (Signed)
   08/02/23 0800  Vitals  Temp 98.2 F (36.8 C)  Temp Source Oral  BP 116/67  MAP (mmHg) 82  BP Location Left Arm  BP Method Automatic  Pulse Rate 73  Pulse Rate Source Dinamap  Resp 16  Level of Consciousness  Level of Consciousness Alert  Orthostatic Lying   BP- Lying 116/67  Pulse- Lying 73  Orthostatic Sitting  BP- Sitting 109/66  Pulse- Sitting 73  Orthostatic Standing at 0 minutes  BP- Standing at 0 minutes (!) 81/43  Pulse- Standing at 0 minutes 83  Orthostatic Standing at 3 minutes  BP- Standing at 3 minutes (!) 73/46  Pulse- Standing at 3 minutes 91  Oxygen Therapy  SpO2 100 %  O2 Device Room Air   ORTHOSTATIC VS AS ABOVE. Patient does c/o a slight dizziness with standing up. Education provided of safety when getting OOB.

## 2023-08-02 NOTE — Plan of Care (Signed)
  Problem: Education: Goal: Knowledge of General Education information will improve Description Including pain rating scale, medication(s)/side effects and non-pharmacologic comfort measures Outcome: Progressing   

## 2023-08-03 DIAGNOSIS — K922 Gastrointestinal hemorrhage, unspecified: Secondary | ICD-10-CM | POA: Diagnosis not present

## 2023-08-03 LAB — LACTIC ACID, PLASMA: Lactic Acid, Venous: 0.9 mmol/L (ref 0.5–1.9)

## 2023-08-03 MED ORDER — SODIUM CHLORIDE 0.9 % IV BOLUS
250.0000 mL | Freq: Once | INTRAVENOUS | Status: AC
  Administered 2023-08-03: 250 mL via INTRAVENOUS

## 2023-08-03 MED ORDER — SODIUM CHLORIDE 0.9 % IV SOLN
INTRAVENOUS | Status: AC
Start: 2023-08-03 — End: 2023-08-03

## 2023-08-03 MED ORDER — SODIUM CHLORIDE 0.9 % IV BOLUS
1000.0000 mL | Freq: Once | INTRAVENOUS | Status: AC
  Administered 2023-08-03: 1000 mL via INTRAVENOUS

## 2023-08-03 NOTE — Progress Notes (Signed)
   08/03/23 0626  Vitals  BP (!) 98/56  MAP (mmHg) 68  BP Location Left Arm  BP Method Automatic  Patient Position (if appropriate) Lying  Pulse Rate 76  Pulse Rate Source Monitor  Resp 16  MEWS COLOR  MEWS Score Color Green  Oxygen Therapy  SpO2 100 %  O2 Device Room Air  MEWS Score  MEWS Temp 0  MEWS Systolic 1  MEWS Pulse 0  MEWS RR 0  MEWS LOC 0  MEWS Score 1  Provider Notification  Provider Name/Title J.Garner Nash, NP  Date Provider Notified 08/03/23  Time Provider Notified [phone]  Method of Notification Page (secure chat)  Notification Reason Other (Comment) (BP still low, 96/55 and repeat 98/56 and pt lying in bed and c/o lightheadedness)  Provider response No new orders  Date of Provider Response 08/03/23  Time of Provider Response 0636    Pt lying in bed while taking BP readings and c/o lightheadedness. On-call team- J. Garner Nash, NP notified via secure chat and NS 250cc bolus initiated as ordered.  Will continue to monitor.

## 2023-08-03 NOTE — Progress Notes (Signed)
PROGRESS NOTE [name]  UEA:[id number] DOB: July 19, 1976 DOA: 07/25/2023 PCP: Felix Pacini, FNP  Brief Narrative/Hospital Course: 47 y.o. female with medical history significant for anxiety/depression, Roux-en-Y gastric bypass, GERD admitted with complaint of abdominal pain nausea and some episodes of vomiting with bright red blood and large amount of bright red clots  and admitted to the hospital for upper GI bleed.  Patient is admitted GI consulted EGD 10/27 >> Patent Billroth I gastroduodenostomy was found,characterized by ulceration, Oozing gastric ulcers with a visible vessel.Treated with bipolar cautery.  General surgery consulted as this ulcer has been present for 6 months despite optimal medical therapy with PPI and Carafate.Diet advanced to bariatric advanced  diet. Patient was found to have orthostatic hypotension and dizziness iV fluid bolus was given multiple times, placed on midodrine, compression hose.  Patient continued to be orthostatic, cosyntropin test ordered 11/2.  At this time she is tolerating diet and largely plan is unchanged for GI issues she will need to continue on PPI slurry and follow-up with general surgery as outpatient. 11/2 Cosyntropin test ordered with persistent orthostatic hypotension. 11/2 Midodrine dose increased to 10 mg 3 times daily     Subjective: Seen and examined.  Still feels dizzy with any sort of movement, no improvement compared to yesterday.  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  At this point hemoglobin has been stable overall doing well.Seen by surgery.  Diet advanced to bariatric advance diet-having difficulty tolerating it, per surgery once able to  tolerate that she can be discharged home.  She will continue on PPI slurry  Further planning for outpatient follow-up for her to discuss about pouch and GJ revision.  Orthostatic hypotension/dizziness: Despite of getting several boluses, compression stockings, and increasing midodrine, she continues to feel dizzy.  Will check orthostatics every shift, will give her another IV fluid normal saline at 150 cc/h for next 10 hours.  ACTH stimulation test and cortisol level within normal limit.  Still orthostatic positive but slightly improved compared to yesterday.  Will continue current high dose of midodrine 15 mg 3 times daily and will give her another fluid bolus of 1 liter.   Anxiety/depression: On Wellbutrin BuSpar Cymbalta Klonopin continue as tolerated PRN.  Mood stable.  Discussed with the patient that she may need to see her psychiatry as outpatient and discuss adjustment of her medications as some of those medications may be contributing to her orthostatic hypotension.  Chronic migraine: Continue pain management-atogepant daily, with Imitrex as needed   Leukocytosis mild, monitor Thrombocytosis likely due #1.  Resolved  DVT prophylaxis: SCDs Start: 07/25/23 0743 Code Status:   Code Status: Full Code Family Communication: plan of care discussed with patient at bedside. Patient status is: Inpatient due to diet intolerance orthostasis Level of care: Med-Surg   Dispo: The patient is from: home            Anticipated disposition: home once tolerating diet.   Objective: Vitals last 24 hrs: Vitals:   08/03/23 0626 08/03/23 0924 08/03/23 0926 08/03/23 0928  BP: (!) 98/56 (!) 118/55 103/62 (!) 75/46  Pulse: 76 (!) 53 68 83  Resp: 16     Temp:      TempSrc:  SpO2: 100%     Weight:      Height:       Weight change:   Physical Examination:  General exam: Appears calm and comfortable  Respiratory system: Clear to auscultation. Respiratory effort normal. Cardiovascular system:  S1 & S2 heard, RRR. No JVD, murmurs, rubs, gallops or clicks. No pedal edema. Gastrointestinal system: Abdomen is nondistended, soft and nontender. No organomegaly or masses felt. Normal bowel sounds heard. Central nervous system: Alert and oriented. No focal neurological deficits. Extremities: Symmetric 5 x 5 power. Skin: No rashes, lesions or ulcers.  Psychiatry: Judgement and insight appear normal. Mood & affect appropriate.    Medications reviewed:  Scheduled Meds:  Atogepant  30 mg Oral Daily   atorvastatin  40 mg Oral Daily   buPROPion  450 mg Oral q morning   busPIRone  15 mg Oral TID   cyanocobalamin  1,000 mcg Intramuscular Once per day on Monday Thursday   DULoxetine  60 mg Oral BID   famotidine  20 mg Oral Daily   feeding supplement  237 mL Oral BID BM   gabapentin  600 mg Oral TID   linaclotide  290 mcg Oral QAC breakfast   mesalamine  2.4 g Oral Q breakfast   metoCLOPramide (REGLAN) injection  5 mg Intravenous Once   midodrine  15 mg Oral TID WC   Naldemedine Tosylate  0.2 mg Oral Daily   oxybutynin  10 mg Oral QHS   pantoprazole  40 mg Oral BID   sucralfate  1 g Oral Q6H   thiamine  100 mg Oral Q1200   Vitamin D (Ergocalciferol)  50,000 Units Oral Q7 days  Continuous Infusions:  sodium chloride Stopped (08/03/23 0704)    Diet Order             Diet Carb Modified Fluid consistency: Thin; Room service appropriate? Yes  Diet effective now                  Intake/Output Summary (Last 24 hours) at 08/03/2023 1125 Last data filed at 08/03/2023 0500 Gross per 24 hour  Intake 1581.69 ml  Output --  Net 1581.69 ml   Net IO Since Admission: 9,228.28 mL [08/03/23 1125]  Wt Readings from Last 3 Encounters:  07/25/23 61.2 kg  02/28/23 76 kg  01/26/23 75.6 kg     Unresulted Labs (From admission, onward)    None     Data Reviewed: I have personally reviewed following labs and imaging studies CBC: Recent Labs  Lab 07/30/23 1004 08/02/23 0859  WBC 5.9  7.7  NEUTROABS  --  4.3  HGB 10.0* 10.4*  HCT 33.6* 34.6*  MCV 98.0 97.7  PLT 337 413*   Basic Metabolic Panel: Recent Labs  Lab 07/30/23 1004 08/02/23 0326  NA 141 142  K 4.0 4.1  CL 108 109  CO2 25 28  GLUCOSE 95 80  BUN 8 12  CREATININE 0.61 0.76  CALCIUM 9.3 8.7*   GFR: Estimated Creatinine Clearance: 85.8 mL/min (by C-G formula based on SCr of 0.76 mg/dL). Liver Function Tests: No results for input(s): "AST", "ALT", "ALKPHOS", "BILITOT", "PROT", "ALBUMIN" in the last 168 hours.  No results for input(s): "LIPASE", "AMYLASE" in the last 168 hours.  No results found for this or any previous visit (from the past 240 hour(s)).  Antimicrobials: Anti-infectives (From admission, onward)    None      Culture/Microbiology    Component Value Date/Time   SDES URINE, RANDOM 06/14/2017 1610  SPECREQUEST NONE 06/14/2017 0833   CULT (A) 06/14/2017 0833    >=100,000 COLONIES/mL ESCHERICHIA COLI Confirmed Extended Spectrum Beta-Lactamase Producer (ESBL)    REPTSTATUS 06/18/2017 FINAL 06/14/2017 1610   Radiology Studies: No results found.   LOS: 7 days   Hughie Closs, MD [facility]  08/03/2023, 11:25 AM

## 2023-08-04 DIAGNOSIS — I951 Orthostatic hypotension: Secondary | ICD-10-CM | POA: Insufficient documentation

## 2023-08-04 DIAGNOSIS — K922 Gastrointestinal hemorrhage, unspecified: Secondary | ICD-10-CM | POA: Diagnosis not present

## 2023-08-04 MED ORDER — SODIUM CHLORIDE 0.9 % IV BOLUS
1000.0000 mL | Freq: Once | INTRAVENOUS | Status: AC
  Administered 2023-08-04: 1000 mL via INTRAVENOUS

## 2023-08-04 MED ORDER — OMEPRAZOLE 2 MG/ML ORAL SUSPENSION: 20.0000 mg | Freq: Two times a day (BID) | ORAL | 0 refills | Status: DC

## 2023-08-04 MED ORDER — MIDODRINE HCL 5 MG PO TABS: 15.0000 mg | ORAL_TABLET | Freq: Three times a day (TID) | ORAL | 0 refills | Status: AC

## 2023-08-04 NOTE — Plan of Care (Signed)
  Problem: Clinical Measurements: Goal: Will remain free from infection Outcome: Progressing Goal: Cardiovascular complication will be avoided Outcome: Progressing   Problem: Activity: Goal: Risk for activity intolerance will decrease Outcome: Progressing   Problem: Nutrition: Goal: Adequate nutrition will be maintained Outcome: Progressing   

## 2023-08-04 NOTE — Progress Notes (Signed)
Home meds x 2 returned to patient in room by this RN. [name] confirmed her son gets off work at [employer] will be picking her up tonight.

## 2023-08-04 NOTE — Plan of Care (Signed)
Pt is A&O x 4. Orthostatic vitals done at the beginning of the shift- pt c/o mild lightheadedness while standing- see vitals flowsheet and on-call team J.Garner Nash, NP updated with vital signs and no new orders.  All PM medications given as ordered. Educated pt on falls and safety precautions, bed alarm on, call bell use, plan of care-pt verbalizes understanding. Refused bed alarm on despite education- call bell in reach. Will continue to monitor.    Problem: Education: Goal: Knowledge of General Education information will improve Description: Including pain rating scale, medication(s)/side effects and non-pharmacologic comfort measures Outcome: Progressing   Problem: Health Behavior/Discharge Planning: Goal: Ability to manage health-related needs will improve Outcome: Progressing   Problem: Clinical Measurements: Goal: Ability to maintain clinical measurements within normal limits will improve Outcome: Progressing Goal: Will remain free from infection Outcome: Progressing Goal: Diagnostic test results will improve Outcome: Progressing Goal: Respiratory complications will improve Outcome: Progressing Goal: Cardiovascular complication will be avoided Outcome: Progressing   Problem: Activity: Goal: Risk for activity intolerance will decrease Outcome: Progressing   Problem: Nutrition: Goal: Adequate nutrition will be maintained Outcome: Progressing   Problem: Coping: Goal: Level of anxiety will decrease Outcome: Progressing   Problem: Elimination: Goal: Will not experience complications related to bowel motility Outcome: Progressing Goal: Will not experience complications related to urinary retention Outcome: Progressing   Problem: Pain Management: Goal: General experience of comfort will improve Outcome: Progressing   Problem: Safety: Goal: Ability to remain free from injury will improve Outcome: Progressing   Problem: Skin Integrity: Goal: Risk for impaired skin  integrity will decrease Outcome: Progressing   Problem: Education: Goal: Ability to identify signs and symptoms of gastrointestinal bleeding will improve Outcome: Progressing   Problem: Bowel/Gastric: Goal: Will show no signs and symptoms of gastrointestinal bleeding Outcome: Progressing   Problem: Fluid Volume: Goal: Will show no signs and symptoms of excessive bleeding Outcome: Progressing   Problem: Clinical Measurements: Goal: Complications related to the disease process, condition or treatment will be avoided or minimized Outcome: Progressing

## 2023-08-04 NOTE — Progress Notes (Signed)
A.Chavez, NP came at bedside to evaluate patient and pt discharge is cancelled for tonight.  1L Ns bolus given as ordered. Abdominal binder and TED hose placed as ordered.  Educated pt on falls and safety precautions, use of call bell, plan of care, pt verbalizes understanding. Pt non-compliant with bed alarm on and call bell despite education.   Call  bell in reach. Will continue to monitor.

## 2023-08-04 NOTE — Discharge Summary (Signed)
Physician Discharge Summary  [name] NFA:[id number] DOB: 12/22/1975 DOA: 07/25/2023  PCP: Felix Pacini, FNP  Admit date: 07/25/2023 Discharge date: 08/04/2023 30 Day Unplanned Readmission Risk Score    Flowsheet Row ED to Hosp-Admission (Current) from 07/25/2023 in [facility]  30 Day Unplanned Readmission Risk Score (%) 19.32 Filed at 08/04/2023 0801       This score is the patient's risk of an unplanned readmission within 30 days of being discharged (0 -100%). The score is based on dignosis, age, lab data, medications, orders, and past utilization.   Low:  0-14.9   Medium: 15-21.9   High: 22-29.9   Extreme: 30 and above          Admitted From: Home Disposition: Home  Recommendations for Outpatient Follow-up:  Follow up with PCP in 1-2 weeks Please obtain BMP/CBC in one week Please follow up with your PCP on the following pending results: Unresulted Labs (From admission, onward)    None         Home Health: None Equipment/Devices: None  Discharge Condition: Stable CODE STATUS: Full code Diet recommendation: Regular  Subjective: Seen and examined.  After show many days, she said that she is feeling a little better.  She has no other complaint and she understands that unfortunately, this is the best we could do to control her orthostatic hypotension.  She is also understanding of the fact that she will have to be very careful when changing her positions or sitting up from laying position or standing up from sitting position and hold onto things to prevent falls.  She is now in agreement with going home.  Brief/Interim Summary: 47 y.o. female with medical history significant for anxiety/depression, Roux-en-Y gastric bypass, GERD admitted with complaint of abdominal pain nausea and some episodes of vomiting with bright red blood and large amount of bright red clots  and admitted to the hospital for upper GI bleed.   EGD 10/27 >> Patent Billroth I gastroduodenostomy was found,characterized by ulceration, Oozing gastric ulcers with a visible vessel.Treated with bipolar cautery.  General surgery consulted as this ulcer has been present for 6 months despite optimal medical therapy with PPI and Carafate.Diet advanced to bariatric advanced  diet. Patient was found to have orthostatic hypotension and dizziness, further details below.   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  At this point hemoglobin has been stable overall doing well.Seen by surgery.  Diet advanced to bariatric advance diet-having difficulty tolerating it, per surgery once able to tolerate that she can be discharged home.  She will continue on PPI slurry  Further planning for outpatient follow-up for her to discuss about pouch and GJ revision.   Orthostatic hypotension/dizziness: Despite of getting several boluses, compression stockings, and increasing midodrine, she continues to feel dizzy although somewhat improved today.  ACTH stimulation test and cortisol level within normal limit.  She is being discharged on maximum dose of midodrine and all the education to prevent falls.   Anxiety/depression: On Wellbutrin BuSpar Cymbalta Klonopin continue as tolerated PRN.  Mood stable.  Discussed with the patient that she may need to see her psychiatry as outpatient and discuss adjustment of her medications as some of those medications may be contributing to her orthostatic hypotension.   Chronic migraine: Continue  pain management-atogepant daily, with Imitrex as needed    Leukocytosis mild, monitor Thrombocytosis likely due #1.  Resolved  Discharge plan was discussed with patient and/or family member and they verbalized understanding  and agreed with it.  Discharge Diagnoses:  Principal Problem:   Upper GI bleed Active Problems:   Orthostatic hypotension    Discharge Instructions   Allergies as of 08/04/2023       Reactions   Ciprofloxacin Itching, Rash   Coconut (cocos Nucifera) Anaphylaxis, Itching   Coconut Oil Anaphylaxis, Itching, Other (See Comments)   Morphine And Codeine Anaphylaxis, Hives, Other (See Comments)   Pt states that she has tolerated Norco and Dilaudid   Oxycodone Anaphylaxis   Oxycodone-acetaminophen Anaphylaxis   Doxycycline Nausea And Vomiting, Other (See Comments)   Penicillamine Hives, Other (See Comments)   Trazodone And Nefazodone Rash   Vistaril [hydroxyzine Hcl] Hives   Silicone    Penicillins Rash   Has patient had a PCN reaction causing immediate rash, facial/tongue/throat swelling, SOB or lightheadedness with hypotension: Yes Has patient had a PCN reaction causing severe rash involving mucus membranes or skin necrosis: No Has patient had a PCN reaction that required hospitalization No Has patient had a PCN reaction occurring within the last 10 years: No If all of the above answers are "NO", then may proceed with Cephalosporin use.   Tape Rash   And paper tape causes a rash if wearing for a prolong period of time        Medication List     STOP taking these medications    pantoprazole 40 MG tablet Commonly known as: PROTONIX       TAKE these medications    albuterol 108 (90 Base) MCG/ACT inhaler Commonly known as: VENTOLIN HFA Inhale 2 puffs into the lungs every 6 (six) hours as needed for wheezing or shortness of breath.   atorvastatin 40 MG tablet Commonly known as: LIPITOR Take 40 mg by mouth daily.   Botox 200 units injection Generic drug: botulinum toxin Type A INJECT 155 UNITS INTRAMUSCULARLY INTO HEAD AND NECK MUSCLES EVERY 3 MONTHS   buPROPion 150 MG 24 hr tablet Commonly known as: WELLBUTRIN XL Take 150 mg by mouth every morning. Take along  with 300 mg=450 mg   buPROPion 300 MG 24 hr tablet Commonly known as: WELLBUTRIN XL Take 300 mg by mouth every morning. Take along with 150 mg=450 mg   busPIRone 15 MG tablet Commonly known as: BUSPAR Take 15 mg by mouth 3 (three) times daily.   clonazePAM 1 MG tablet Commonly known as: KLONOPIN Take 1 mg by mouth 2 (two) times daily as needed for anxiety.   cyanocobalamin 1000 MCG/ML injection Commonly known as: VITAMIN B12 Inject 1,000 mcg into the muscle 2 (two) times a week.   diclofenac Sodium 1 % Gel Commonly known as: VOLTAREN Apply topically 4 (four) times daily.   dicyclomine 10 MG capsule Commonly known as: BENTYL Take 10 mg by mouth 2 (two) times daily as needed for spasms.   DULoxetine 60 MG capsule Commonly known as: CYMBALTA Take 1 capsule (60 mg total) by mouth 2 (two) times daily.   EPINEPHrine 0.3 mg/0.3 mL Soaj injection Commonly known as: EPI-PEN Inject 0.3 mg into the muscle as needed for anaphylaxis.   ferrous sulfate 325 (65 FE) MG tablet Take 1 tablet (325 mg total) by mouth daily with breakfast.   fluticasone 50 MCG/ACT nasal spray Commonly known as: FLONASE Place 2 sprays into both nostrils daily as  needed for allergies or rhinitis.   gabapentin 600 MG tablet Commonly known as: NEURONTIN Take 600 mg by mouth 3 (three) times daily.   hydrocortisone 2.5 % rectal cream Commonly known as: ANUSOL-HC Place 1 Application rectally daily as needed for hemorrhoids.   HYDROmorphone 8 MG tablet Commonly known as: DILAUDID Take 0.5 tablets (4 mg total) by mouth every 3 (three) hours.   linaclotide 290 MCG Caps capsule Commonly known as: LINZESS Take 290 mcg by mouth daily before breakfast.   magnesium oxide 400 (240 Mg) MG tablet Commonly known as: MAG-OX Take 1 tablet by mouth daily.   meclizine 25 MG tablet Commonly known as: ANTIVERT Take 25 mg by mouth 3 (three) times daily.   mesalamine 1.2 g EC tablet Commonly known as: LIALDA Take  2.4 g by mouth daily with breakfast.   midodrine 5 MG tablet Commonly known as: PROAMATINE Take 3 tablets (15 mg total) by mouth 3 (three) times daily with meals.   mirtazapine 15 MG tablet Commonly known as: REMERON Take 15 mg by mouth at bedtime as needed (sleep).   multivitamin with minerals Tabs tablet Take 1 tablet by mouth daily.   Narcan 4 MG/0.1ML Liqd nasal spray kit Generic drug: naloxone Place 1 spray into the nose daily as needed (overdose).   omeprazole 2 mg/mL Susp oral suspension Commonly known as: KONVOMEP Take 10 mLs (20 mg total) by mouth 2 (two) times daily before a meal.   ondansetron 8 MG tablet Commonly known as: ZOFRAN Take 8 mg by mouth every 8 (eight) hours as needed for nausea or vomiting.   oxybutynin 5 MG tablet Commonly known as: DITROPAN Take 10 mg by mouth at bedtime.   Qulipta 30 MG Tabs Generic drug: Atogepant Take 1 tablet (30 mg total) by mouth daily.   sucralfate 1 g tablet Commonly known as: CARAFATE Take 1 tablet (1 g total) by mouth every 6 (six) hours.   SUMAtriptan 50 MG tablet Commonly known as: Imitrex Take 1 tablet (50 mg total) by mouth every 2 (two) hours as needed for migraine. May repeat in 2 hours if headache persists or recurs.   Symproic 0.2 MG Tabs Generic drug: Naldemedine Tosylate Take 1 tablet by mouth daily.   thiamine 100 MG tablet Commonly known as: Vitamin B-1 Take 100 mg by mouth daily at 12 noon.   torsemide 10 MG tablet Commonly known as: DEMADEX Take 10 mg by mouth daily.   valACYclovir 1000 MG tablet Commonly known as: VALTREX Take 1,000 mg by mouth 2 (two) times daily as needed.   Vitamin D3 1.25 MG (50000 UT) Caps Take 50,000 Units by mouth every 7 (seven) days.        Follow-up Information     Stechschulte, Hyman Hopes, MD Follow up on 08/12/2023.   Specialty: Surgery Why: 11:10am, arrive by 10:30am for paperwork and check in process, Please bring your insurance card and photo ID Contact  information: [address] [place] [address] [phone]         Felix Pacini, FNP Follow up in 1 week(s).   Specialty: Endocrinology Contact information: [address] [place] [address] [phone]         Felix Pacini, FNP Follow up in 1 week(s).   Specialty: Endocrinology Contact information: [address] [place] [address] [phone]                Allergies  Allergen Reactions   Ciprofloxacin Itching and Rash  Coconut (Cocos Nucifera) Anaphylaxis and Itching   Coconut Oil Anaphylaxis, Itching and Other (See Comments)   Morphine And Codeine Anaphylaxis, Hives and Other (See Comments)    Pt states that she has tolerated Norco and Dilaudid   Oxycodone Anaphylaxis   Oxycodone-Acetaminophen Anaphylaxis   Doxycycline Nausea And Vomiting and Other (See Comments)   Penicillamine Hives and Other (See Comments)   Trazodone And Nefazodone Rash   Vistaril [Hydroxyzine Hcl] Hives   Silicone    Penicillins Rash    Has patient had a PCN reaction causing immediate rash, facial/tongue/throat swelling, SOB or lightheadedness with hypotension: Yes Has patient had a PCN reaction causing severe rash involving mucus membranes or skin necrosis: No Has patient had a PCN reaction that required hospitalization No Has patient had a PCN reaction occurring within the last 10 years: No If all of the above answers are "NO", then may proceed with Cephalosporin use.   Tape Rash    And paper tape causes a rash if wearing for a prolong period of time    Consultations: General surgery and GI   Procedures/Studies: CT ANGIO ABDOMEN PELVIS  W & WO CONTRAST  Result Date: 07/25/2023 CLINICAL DATA:  Lower GI bleed. Hematemesis with bright red clots and some blood in stool. History of ulcers. EXAM: CTA ABDOMEN AND PELVIS WITHOUT AND WITH CONTRAST TECHNIQUE: Multidetector CT imaging of the abdomen and pelvis was performed using the  standard protocol during bolus administration of intravenous contrast. Multiplanar reconstructed images and MIPs were obtained and reviewed to evaluate the vascular anatomy. RADIATION DOSE REDUCTION: This exam was performed according to the departmental dose-optimization program which includes automated exposure control, adjustment of the mA and/or kV according to patient size and/or use of iterative reconstruction technique. CONTRAST:  OMNIPAQUE IOHEXOL 350 MG/ML SOLN COMPARISON:  01/25/2023. FINDINGS: VASCULAR Aorta: Normal caliber aorta without aneurysm, dissection, vasculitis or significant stenosis. Celiac: Patent without evidence of aneurysm, dissection, vasculitis or significant stenosis. Left hepatic artery originates from the aorta. SMA: Patent without evidence of aneurysm, dissection, vasculitis or significant stenosis. Right hepatic artery originates on the SMA. Renals: Both renal arteries are patent without evidence of aneurysm, dissection, vasculitis, fibromuscular dysplasia or significant stenosis. IMA: Patent without evidence of aneurysm, dissection, vasculitis or significant stenosis. Inflow: Patent without evidence of aneurysm, dissection, vasculitis or significant stenosis. Proximal Outflow: Bilateral common femoral and visualized portions of the superficial and profunda femoral arteries are patent without evidence of aneurysm, dissection, vasculitis or significant stenosis. Veins: No obvious venous abnormality within the limitations of this arterial phase study. Review of the MIP images confirms the above findings. NON-VASCULAR Lower chest: The heart is normal in size and there is a small pericardial effusion. The lung bases are clear. Hepatobiliary: No focal liver abnormality is seen. No gallstones, gallbladder wall thickening, or biliary dilatation. Pancreas: Unremarkable. No pancreatic ductal dilatation or surrounding inflammatory changes. Spleen: Normal size. A 2.0 cm hypodensity is  noted in the spleen, likely cyst. Adrenals/Urinary Tract: The adrenal glands are within normal limits. No renal calculus or hydronephrosis bilaterally. The bladder is unremarkable. Stomach/Bowel: There is a small hiatal hernia with thickening and edema of the walls of the distal esophagus. Gastric bypass surgical changes are present. Appendix appears normal. There is bowel wall thickening with surrounding edema involving extending from the gastrojejunostomy anastomosis. No bowel obstruction, free air, or pneumatosis. No acute hemorrhage is seen. Lymphatic: No significant vascular findings are present. No enlarged abdominal or pelvic lymph nodes. Reproductive: Status post hysterectomy. No  adnexal masses. Other: No abdominopelvic ascites. Musculoskeletal: A left sacral neurostimulator device is noted. 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 Degenerative changes are noted in the thoracolumbar spine. No acute osseous abnormality. IMPRESSION: VASCULAR No evidence of acute or active hemorrhage. NON-VASCULAR 1. Status post gastric bypass with bowel wall thickening and edema at the gastrojejunostomy anastomosis, which may be infectious or inflammatory. 2. Small hiatal hernia with mild thickening of the walls of the distal esophagus with associated edema, suggesting esophagitis. Electronically Signed   By: Thornell Sartorius M.D.   On: 07/25/2023 04:39     Discharge Exam: Vitals:   08/03/23 2157 08/04/23 0537  BP: 132/79 117/74  Pulse: (!) 53 (!) 57  Resp: 16 16  Temp:  97.6 F (36.4 C)  SpO2: 100% 100%   Vitals:   08/03/23 2012 08/03/23 2014 08/03/23 2157 08/04/23 0537  BP: (!) 155/75 113/77 132/79 117/74  Pulse: (!) 52 66 (!) 53 (!) 57  Resp: 16  16 16   Temp:    97.6 F (36.4 C)  TempSrc:    Oral  SpO2: 100% 100% 100% 100%  Weight:      Height:        General: Pt is alert, awake, not in acute distress Cardiovascular: RRR, S1/S2 +, no rubs, no gallops Respiratory: CTA bilaterally, no  wheezing, no rhonchi Abdominal: Soft, NT, ND, bowel sounds + Extremities: no edema, no cyanosis    The results of significant diagnostics from this hospitalization (including imaging, microbiology, ancillary and laboratory) are listed below for reference.     Microbiology: No results found for this or any previous visit (from the past 240 hour(s)).   Labs: BNP (last 3 results) No results for input(s): "BNP" in the last 8760 hours. Basic Metabolic Panel: Recent Labs  Lab 07/30/23 1004 08/02/23 0326  NA 141 142  K 4.0 4.1  CL 108 109  CO2 25 28  GLUCOSE 95 80  BUN 8 12  CREATININE 0.61 0.76  CALCIUM 9.3 8.7*   Liver Function Tests: No results for input(s): "AST", "ALT", "ALKPHOS", "BILITOT", "PROT", "ALBUMIN" in the last 168 hours. No results for input(s): "LIPASE", "AMYLASE" in the last 168 hours. No results for input(s): "AMMONIA" in the last 168 hours. CBC: Recent Labs  Lab 07/30/23 1004 08/02/23 0859  WBC 5.9 7.7  NEUTROABS  --  4.3  HGB 10.0* 10.4*  HCT 33.6* 34.6*  MCV 98.0 97.7  PLT 337 413*   Cardiac Enzymes: No results for input(s): "CKTOTAL", "CKMB", "CKMBINDEX", "TROPONINI" in the last 168 hours. BNP: Invalid input(s): "POCBNP" CBG: No results for input(s): "GLUCAP" in the last 168 hours. D-Dimer No results for input(s): "DDIMER" in the last 72 hours. Hgb A1c No results for input(s): "HGBA1C" in the last 72 hours. Lipid Profile No results for input(s): "CHOL", "HDL", "LDLCALC", "TRIG", "CHOLHDL", "LDLDIRECT" in the last 72 hours. Thyroid function studies No results for input(s): "TSH", "T4TOTAL", "T3FREE", "THYROIDAB" in the last 72 hours.  Invalid input(s): "FREET3" Anemia work up No results for input(s): "VITAMINB12", "FOLATE", "FERRITIN", "TIBC", "IRON", "RETICCTPCT" in the last 72 hours. Urinalysis    Component Value Date/Time   COLORURINE AMBER (A) 07/25/2023 0315   APPEARANCEUR CLOUDY (A) 07/25/2023 0315   APPEARANCEUR Cloudy (A)  12/15/2017 1722   LABSPEC 1.036 (H) 07/25/2023 0315   PHURINE 5.0 07/25/2023 0315   GLUCOSEU NEGATIVE 07/25/2023 0315   HGBUR NEGATIVE 07/25/2023 0315   BILIRUBINUR SMALL (A) 07/25/2023 0315   BILIRUBINUR negative 01/05/2020 1434  BILIRUBINUR Negative 12/15/2017 1722   KETONESUR 5 (A) 07/25/2023 0315   PROTEINUR 100 (A) 07/25/2023 0315   UROBILINOGEN 0.2 01/05/2020 1434   UROBILINOGEN 0.2 11/29/2018 1742   NITRITE NEGATIVE 07/25/2023 0315   LEUKOCYTESUR SMALL (A) 07/25/2023 0315   Sepsis Labs Recent Labs  Lab 07/30/23 1004 08/02/23 0859  WBC 5.9 7.7   Microbiology No results found for this or any previous visit (from the past 240 hour(s)).  FURTHER DISCHARGE INSTRUCTIONS:   Get Medicines reviewed and adjusted: Please take all your medications with you for your next visit with your Primary MD   Laboratory/radiological data: Please request your Primary MD to go over all hospital tests and procedure/radiological results at the follow up, please ask your Primary MD to get all Hospital records sent to his/her office.   In some cases, they will be blood work, cultures and biopsy results pending at the time of your discharge. Please request that your primary care M.D. goes through all the records of your hospital data and follows up on these results.   Also Note the following: If you experience worsening of your admission symptoms, develop shortness of breath, life threatening emergency, suicidal or homicidal thoughts you must seek medical attention immediately by calling 911 or calling your MD immediately  if symptoms less severe.   You must read complete instructions/literature along with all the possible adverse reactions/side effects for all the Medicines you take and that have been prescribed to you. Take any new Medicines after you have completely understood and accpet all the possible adverse reactions/side effects.    Do not drive when taking Pain medications or sleeping  medications (Benzodaizepines)   Do not take more than prescribed Pain, Sleep and Anxiety Medications. It is not advisable to combine anxiety,sleep and pain medications without talking with your primary care practitioner   Special Instructions: If you have smoked or chewed Tobacco  in the last 2 yrs please stop smoking, stop any regular Alcohol  and or any Recreational drug use.   Wear Seat belts while driving.   Please note: You were cared for by a hospitalist during your hospital stay. Once you are discharged, your primary care physician will handle any further medical issues. Please note that NO REFILLS for any discharge medications will be authorized once you are discharged, as it is imperative that you return to your primary care physician (or establish a relationship with a primary care physician if you do not have one) for your post hospital discharge needs so that they can reassess your need for medications and monitor your lab values  Time coordinating discharge: Over 30 minutes  SIGNED:   Hughie Closs, MD  [facility] 08/04/2023, 11:19 AM *Please note that this is a verbal dictation therefore any spelling or grammatical errors are due to the "[employer] One" system interpretation. If 7PM-7AM, please contact night-coverage [email or web address]

## 2023-08-04 NOTE — Progress Notes (Signed)
   08/04/23 2013  Assess: MEWS Score  BP (!) 61/38  MAP (mmHg) (!) 45  Pulse Rate 99  SpO2 92 %  O2 Device Room Air  Assess: MEWS Score  MEWS Temp 0  MEWS Systolic 3  MEWS Pulse 0  MEWS RR 0  MEWS LOC 0  MEWS Score 3  MEWS Score Color Yellow  Provider Notification  Provider Name/Title Anthoney Harada, NP  Date Provider Notified 08/04/23  Time Provider Notified 2017  Method of Notification Page (secure chat)  Notification Reason Other (Comment);Change in status (pt c/o dizzinees, orthostatic vitals positive, standing BP dropped to 61/38 and pt c/o dizziness. pt getting discharged tonight at 9 pm waiting for ride. pt verbalizes that she is afraid that she might fall at home. please come and evaluate)  Assess: SIRS CRITERIA  SIRS Temperature  0  SIRS Pulse 1  SIRS Respirations  0  SIRS WBC 0  SIRS Score Sum  1    Pt is A&O x 4. Orthostatic vitals done - positive for orthostatic vitals. BP 111/78(lying), 97/76(sitting) and 61/38(standing)- pt c/o dizziness while standing.  Pt getting discharged tonight at 9 pm and waiting for her ride home. Pt verbalizes that she is afraid that she might fall at home. On-call team- Anthoney Harada, NP notified via secure chat to come and evaluate pt.

## 2023-08-04 NOTE — Progress Notes (Signed)
    Patient Name: [name]           DOB: 08/21/1976  MRN: [id number]      Admission Date: 07/25/2023  Attending Provider: Hughie Closs, MD  Primary Diagnosis: Upper GI bleed   Level of care: Med-Surg    CROSS COVER NOTE   Date of Service   08/04/2023   [name], 47 y.o. female, was admitted on 07/25/2023 for Upper GI bleed.    HPI/Events of Note   Orthostatic hypotension, BP 61/38 Patient endorses feeling near- syncope when standing.  Symptoms include dizziness, lightheadedness, temporary vision change described as "black dots."  Symptoms improve when lying supine.  Patient fears she will have syncopal episode at home. BP improved during supine position.  SBP 100-110. Patient has been treated for orthostatic hypotension/dizziness during her stay in the hospital with several boluses, compression stockings, midodrine.  Patient states she is taking in plenty of oral fluids, but feels better after also receiving IV fluid.   Interventions/ Plan   Fluid bolus, 1 L Encourage continued use of compression stockings.  Add on abdominal binder when ambulating or standing/sitting. Hold discharge for tonight.  Day provider to reassess.      Anthoney Harada, DNP, ACNPC- AG [facility]

## 2023-08-05 DIAGNOSIS — K922 Gastrointestinal hemorrhage, unspecified: Secondary | ICD-10-CM | POA: Diagnosis not present

## 2023-08-05 LAB — LACTIC ACID, PLASMA: Lactic Acid, Venous: 1.6 mmol/L (ref 0.5–1.9)

## 2023-08-05 NOTE — Discharge Summary (Signed)
Physician Discharge Summary  [name] ZOX:[id number] DOB: [birth year] DOA: 07/25/2023  PCP: Felix Pacini, FNP  Admit date: 07/25/2023 Discharge date: 08/05/2023 30 Day Unplanned Readmission Risk Score    Flowsheet Row ED to Hosp-Admission (Current) from 07/25/2023 in [facility]  30 Day Unplanned Readmission Risk Score (%) 19.32 Filed at 08/04/2023 0801       This score is the patient's risk of an unplanned readmission within 30 days of being discharged (0 -100%). The score is based on dignosis, age, lab data, medications, orders, and past utilization.   Low:  0-14.9   Medium: 15-21.9   High: 22-29.9   Extreme: 30 and above          Admitted From: Home Disposition: Home  Recommendations for Outpatient Follow-up:  Follow up with PCP in 1-2 weeks Please obtain BMP/CBC in one week Please follow up with your PCP on the following pending results: Unresulted Labs (From admission, onward)    None         Home Health: None Equipment/Devices: None  Discharge Condition: Stable CODE STATUS: Full code Diet recommendation: Regular  Subjective: Seen and examined.  After show many days, she said that she is feeling a little better.  She has no other complaint and she understands that unfortunately, this is the best we could do to control her orthostatic hypotension.  She is also understanding of the fact that she will have to be very careful when changing her positions or sitting up from laying position or standing up from sitting position and hold onto things to prevent falls.  She is now in agreement with going home.  Brief/Interim Summary: 47 y.o. female with medical history significant for anxiety/depression, Roux-en-Y gastric bypass, GERD admitted with complaint of abdominal pain nausea and some episodes of vomiting with bright red blood and large amount of bright red clots  and admitted to the hospital for upper GI bleed.   EGD 10/27 >> Patent Billroth I gastroduodenostomy was found,characterized by ulceration, Oozing gastric ulcers with a visible vessel.Treated with bipolar cautery.  General surgery consulted as this ulcer has been present for 6 months despite optimal medical therapy with PPI and Carafate.Diet advanced to bariatric advanced  diet. Patient was found to have orthostatic hypotension and dizziness, further details below.   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  At this point hemoglobin has been stable overall doing well.Seen by surgery.  Diet advanced to bariatric advance diet-having difficulty tolerating it, per surgery once able to tolerate that she can be discharged home.  She will continue on PPI slurry  Further planning for outpatient follow-up for her to discuss about pouch and GJ revision.   Orthostatic hypotension/dizziness: Despite of getting several boluses, compression stockings, and increasing midodrine, she continues to feel dizzy although somewhat improved today.  ACTH stimulation test and cortisol level within normal limit.  She is being discharged on maximum dose of midodrine and all the education to prevent falls.   Anxiety/depression: On Wellbutrin BuSpar Cymbalta Klonopin continue as tolerated PRN.  Mood stable.  Discussed with the patient that she may need to see her psychiatry as outpatient and discuss adjustment of her medications as some of those medications may be contributing to her orthostatic hypotension.   Chronic migraine: Continue  pain management-atogepant daily, with Imitrex as needed    Leukocytosis mild, monitor Thrombocytosis likely due #1.  Resolved  Addendum/update 08/05/2023, 11:11 AM: Patient was being discharged yesterday on 08/04/2023.  Noted  that when patient got up to get ready to go home, she had significant orthostatic positive and she was dizzy, night hospitalist was called and discharge was canceled.  Patient seen and examined this morning.  She was very regretful of what ever happened yesterday.  She truly wants to go home.  Once again we went over all the education that we went over yesterday about what she needs to do to prevent falls because we have done everything we could including abdominal binder, TED hose and maximized the dose of midodrine trying to treat her orthostatic hypotension but she understands that this is going to be a long-term problem for her and all she needs to do is to prevent falls.  She verbalized understanding again today and she is now wanting to go home however she is requesting a shower seat which I have ordered.  Discharge plan was discussed with patient and/or family member and they verbalized understanding and agreed with it.  Discharge Diagnoses:  Principal Problem:   Upper GI bleed Active Problems:   Orthostatic hypotension    Discharge Instructions   Allergies as of 08/05/2023       Reactions   Ciprofloxacin Itching, Rash   Coconut (cocos Nucifera) Anaphylaxis, Itching   Coconut Oil Anaphylaxis, Itching, Other (See Comments)   Morphine And Codeine Anaphylaxis, Hives, Other (See Comments)   Pt states that she has tolerated Norco and Dilaudid   Oxycodone Anaphylaxis   Oxycodone-acetaminophen Anaphylaxis   Doxycycline Nausea And Vomiting, Other (See Comments)   Penicillamine Hives, Other (See Comments)   Trazodone And Nefazodone Rash   Vistaril [hydroxyzine Hcl] Hives   Silicone    Penicillins Rash   Has patient had a PCN reaction causing immediate rash, facial/tongue/throat swelling, SOB or lightheadedness with hypotension: Yes Has patient had a PCN reaction causing severe rash involving mucus membranes or skin necrosis: No Has patient had a PCN reaction that required hospitalization  No Has patient had a PCN reaction occurring within the last 10 years: No If all of the above answers are "NO", then may proceed with Cephalosporin use.   Tape Rash   And paper tape causes a rash if wearing for a prolong period of time        Medication List     STOP taking these medications    pantoprazole 40 MG tablet Commonly known as: PROTONIX       TAKE these medications    albuterol 108 (90 Base) MCG/ACT inhaler Commonly known as: VENTOLIN HFA Inhale 2 puffs into the lungs every 6 (six) hours as needed for wheezing or shortness of breath.   atorvastatin 40 MG tablet Commonly known as: LIPITOR Take 40 mg by mouth daily.   Botox 200 units injection Generic drug: botulinum toxin Type A INJECT 155 UNITS INTRAMUSCULARLY INTO HEAD AND NECK MUSCLES EVERY 3 MONTHS   buPROPion 150 MG 24 hr tablet Commonly known as: WELLBUTRIN XL Take 150 mg by mouth every morning. Take along with 300 mg=450 mg   buPROPion 300 MG 24 hr tablet Commonly known as: WELLBUTRIN XL Take 300 mg by mouth every morning. Take along with 150 mg=450 mg   busPIRone 15 MG tablet Commonly known as: BUSPAR Take 15 mg by mouth 3 (three) times daily.   clonazePAM 1 MG tablet  Commonly known as: KLONOPIN Take 1 mg by mouth 2 (two) times daily as needed for anxiety.   cyanocobalamin 1000 MCG/ML injection Commonly known as: VITAMIN B12 Inject 1,000 mcg into the muscle 2 (two) times a week.   diclofenac Sodium 1 % Gel Commonly known as: VOLTAREN Apply topically 4 (four) times daily.   dicyclomine 10 MG capsule Commonly known as: BENTYL Take 10 mg by mouth 2 (two) times daily as needed for spasms.   DULoxetine 60 MG capsule Commonly known as: CYMBALTA Take 1 capsule (60 mg total) by mouth 2 (two) times daily.   EPINEPHrine 0.3 mg/0.3 mL Soaj injection Commonly known as: EPI-PEN Inject 0.3 mg into the muscle as needed for anaphylaxis.   ferrous sulfate 325 (65 FE) MG tablet Take 1 tablet (325  mg total) by mouth daily with breakfast.   fluticasone 50 MCG/ACT nasal spray Commonly known as: FLONASE Place 2 sprays into both nostrils daily as needed for allergies or rhinitis.   gabapentin 600 MG tablet Commonly known as: NEURONTIN Take 600 mg by mouth 3 (three) times daily.   hydrocortisone 2.5 % rectal cream Commonly known as: ANUSOL-HC Place 1 Application rectally daily as needed for hemorrhoids.   HYDROmorphone 8 MG tablet Commonly known as: DILAUDID Take 0.5 tablets (4 mg total) by mouth every 3 (three) hours.   linaclotide 290 MCG Caps capsule Commonly known as: LINZESS Take 290 mcg by mouth daily before breakfast.   magnesium oxide 400 (240 Mg) MG tablet Commonly known as: MAG-OX Take 1 tablet by mouth daily.   meclizine 25 MG tablet Commonly known as: ANTIVERT Take 25 mg by mouth 3 (three) times daily.   mesalamine 1.2 g EC tablet Commonly known as: LIALDA Take 2.4 g by mouth daily with breakfast.   midodrine 5 MG tablet Commonly known as: PROAMATINE Take 3 tablets (15 mg total) by mouth 3 (three) times daily with meals.   mirtazapine 15 MG tablet Commonly known as: REMERON Take 15 mg by mouth at bedtime as needed (sleep).   multivitamin with minerals Tabs tablet Take 1 tablet by mouth daily.   Narcan 4 MG/0.1ML Liqd nasal spray kit Generic drug: naloxone Place 1 spray into the nose daily as needed (overdose).   omeprazole 2 mg/mL Susp oral suspension Commonly known as: KONVOMEP Take 10 mLs (20 mg total) by mouth 2 (two) times daily before a meal.   ondansetron 8 MG tablet Commonly known as: ZOFRAN Take 8 mg by mouth every 8 (eight) hours as needed for nausea or vomiting.   oxybutynin 5 MG tablet Commonly known as: DITROPAN Take 10 mg by mouth at bedtime.   Qulipta 30 MG Tabs Generic drug: Atogepant Take 1 tablet (30 mg total) by mouth daily.   sucralfate 1 g tablet Commonly known as: CARAFATE Take 1 tablet (1 g total) by mouth every 6  (six) hours.   SUMAtriptan 50 MG tablet Commonly known as: Imitrex Take 1 tablet (50 mg total) by mouth every 2 (two) hours as needed for migraine. May repeat in 2 hours if headache persists or recurs.   Symproic 0.2 MG Tabs Generic drug: Naldemedine Tosylate Take 1 tablet by mouth daily.   thiamine 100 MG tablet Commonly known as: Vitamin B-1 Take 100 mg by mouth daily at 12 noon.   torsemide 10 MG tablet Commonly known as: DEMADEX Take 10 mg by mouth daily.   valACYclovir 1000 MG tablet Commonly known as: VALTREX Take 1,000 mg by mouth 2 (two) times  daily as needed.   Vitamin D3 1.25 MG (50000 UT) Caps Take 50,000 Units by mouth every 7 (seven) days.               Durable Medical Equipment  (From admission, onward)           Start     Ordered   08/05/23 1111  For home use only DME Shower stool  Once        08/05/23 1110            Follow-up Information     Stechschulte, Hyman Hopes, MD Follow up on 08/12/2023.   Specialty: Surgery Why: 11:10am, arrive by 10:30am for paperwork and check in process, Please bring your insurance card and photo ID Contact information: [address] [place] [address] [phone]         Felix Pacini, FNP Follow up in 1 week(s).   Specialty: Endocrinology Contact information: [address] [place] [address] [phone]         Felix Pacini, FNP Follow up in 1 week(s).   Specialty: Endocrinology Contact information: [address] [place] [address] [phone]                Allergies  Allergen Reactions   Ciprofloxacin Itching and Rash   Coconut (Cocos Nucifera) Anaphylaxis and Itching   Coconut Oil Anaphylaxis, Itching and Other (See Comments)   Morphine And Codeine Anaphylaxis, Hives and Other (See Comments)    Pt states that she has tolerated Norco and Dilaudid   Oxycodone Anaphylaxis   Oxycodone-Acetaminophen Anaphylaxis   Doxycycline Nausea And  Vomiting and Other (See Comments)   Penicillamine Hives and Other (See Comments)   Trazodone And Nefazodone Rash   Vistaril [Hydroxyzine Hcl] Hives   Silicone    Penicillins Rash    Has patient had a PCN reaction causing immediate rash, facial/tongue/throat swelling, SOB or lightheadedness with hypotension: Yes Has patient had a PCN reaction causing severe rash involving mucus membranes or skin necrosis: No Has patient had a PCN reaction that required hospitalization No Has patient had a PCN reaction occurring within the last 10 years: No If all of the above answers are "NO", then may proceed with Cephalosporin use.   Tape Rash    And paper tape causes a rash if wearing for a prolong period of time    Consultations: General surgery and GI   Procedures/Studies: CT ANGIO ABDOMEN PELVIS  W & WO CONTRAST  Result Date: 07/25/2023 CLINICAL DATA:  Lower GI bleed. Hematemesis with bright red clots and some blood in stool. History of ulcers. EXAM: CTA ABDOMEN AND PELVIS WITHOUT AND WITH CONTRAST TECHNIQUE: Multidetector CT imaging of the abdomen and pelvis was performed using the standard protocol during bolus administration of intravenous contrast. Multiplanar reconstructed images and MIPs were obtained and reviewed to evaluate the vascular anatomy. RADIATION DOSE REDUCTION: This exam was performed according to the departmental dose-optimization program which includes automated exposure control, adjustment of the mA and/or kV according to patient size and/or use of iterative reconstruction technique. CONTRAST:  OMNIPAQUE IOHEXOL 350 MG/ML SOLN COMPARISON:  01/25/2023. FINDINGS: VASCULAR Aorta: Normal caliber aorta without aneurysm, dissection, vasculitis or significant stenosis. Celiac: Patent without evidence of aneurysm, dissection, vasculitis or significant stenosis. Left hepatic artery originates from the aorta. SMA: Patent without evidence of aneurysm, dissection, vasculitis or significant  stenosis. Right hepatic artery originates on the SMA. Renals: Both renal arteries are patent without evidence of aneurysm,  dissection, vasculitis, fibromuscular dysplasia or significant stenosis. IMA: Patent without evidence of aneurysm, dissection, vasculitis or significant stenosis. Inflow: Patent without evidence of aneurysm, dissection, vasculitis or significant stenosis. Proximal Outflow: Bilateral common femoral and visualized portions of the superficial and profunda femoral arteries are patent without evidence of aneurysm, dissection, vasculitis or significant stenosis. Veins: No obvious venous abnormality within the limitations of this arterial phase study. Review of the MIP images confirms the above findings. NON-VASCULAR Lower chest: The heart is normal in size and there is a small pericardial effusion. The lung bases are clear. Hepatobiliary: No focal liver abnormality is seen. No gallstones, gallbladder wall thickening, or biliary dilatation. Pancreas: Unremarkable. No pancreatic ductal dilatation or surrounding inflammatory changes. Spleen: Normal size. A 2.0 cm hypodensity is noted in the spleen, likely cyst. Adrenals/Urinary Tract: The adrenal glands are within normal limits. No renal calculus or hydronephrosis bilaterally. The bladder is unremarkable. Stomach/Bowel: There is a small hiatal hernia with thickening and edema of the walls of the distal esophagus. Gastric bypass surgical changes are present. Appendix appears normal. There is bowel wall thickening with surrounding edema involving extending from the gastrojejunostomy anastomosis. No bowel obstruction, free air, or pneumatosis. No acute hemorrhage is seen. Lymphatic: No significant vascular findings are present. No enlarged abdominal or pelvic lymph nodes. Reproductive: Status post hysterectomy. No adnexal masses. Other: No abdominopelvic ascites. Musculoskeletal: A left sacral neurostimulator device is noted. 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 Degenerative changes are noted in the thoracolumbar spine. No acute osseous abnormality. IMPRESSION: VASCULAR No evidence of acute or active hemorrhage. NON-VASCULAR 1. Status post gastric bypass with bowel wall thickening and edema at the gastrojejunostomy anastomosis, which may be infectious or inflammatory. 2. Small hiatal hernia with mild thickening of the walls of the distal esophagus with associated edema, suggesting esophagitis. Electronically Signed   By: Thornell Sartorius M.D.   On: 07/25/2023 04:39     Discharge Exam: Vitals:   08/05/23 0552 08/05/23 1023  BP: (!) 95/58 122/73  Pulse: 71 (!) 54  Resp:  18  Temp:  98.1 F (36.7 C)  SpO2: 100% 98%   Vitals:   08/05/23 0150 08/05/23 0551 08/05/23 0552 08/05/23 1023  BP: (!) 103/57 (!) 88/50 (!) 95/58 122/73  Pulse: 70 69 71 (!) 54  Resp: 17 16  18   Temp: 98 F (36.7 C) 97.7 F (36.5 C)  98.1 F (36.7 C)  TempSrc:  Oral  Oral  SpO2: 100% 100% 100% 98%  Weight:      Height:        General: Pt is alert, awake, not in acute distress Cardiovascular: RRR, S1/S2 +, no rubs, no gallops Respiratory: CTA bilaterally, no wheezing, no rhonchi Abdominal: Soft, NT, ND, bowel sounds + Extremities: no edema, no cyanosis    The results of significant diagnostics from this hospitalization (including imaging, microbiology, ancillary and laboratory) are listed below for reference.     Microbiology: No results found for this or any previous visit (from the past 240 hour(s)).   Labs: BNP (last 3 results) No results for input(s): "BNP" in the last 8760 hours. Basic Metabolic Panel: Recent Labs  Lab 07/30/23 1004 08/02/23 0326  NA 141 142  K 4.0 4.1  CL 108 109  CO2 25 28  GLUCOSE 95 80  BUN 8 12  CREATININE 0.61 0.76  CALCIUM 9.3 8.7*   Liver Function Tests: No results for input(s): "AST", "ALT", "ALKPHOS", "BILITOT", "PROT", "ALBUMIN" in the last 168 hours.  No results for input(s): "LIPASE",  "AMYLASE" in the last 168 hours. No results for input(s): "AMMONIA" in the last 168 hours. CBC: Recent Labs  Lab 07/30/23 1004 08/02/23 0859  WBC 5.9 7.7  NEUTROABS  --  4.3  HGB 10.0* 10.4*  HCT 33.6* 34.6*  MCV 98.0 97.7  PLT 337 413*   Cardiac Enzymes: No results for input(s): "CKTOTAL", "CKMB", "CKMBINDEX", "TROPONINI" in the last 168 hours. BNP: Invalid input(s): "POCBNP" CBG: No results for input(s): "GLUCAP" in the last 168 hours. D-Dimer No results for input(s): "DDIMER" in the last 72 hours. Hgb A1c No results for input(s): "HGBA1C" in the last 72 hours. Lipid Profile No results for input(s): "CHOL", "HDL", "LDLCALC", "TRIG", "CHOLHDL", "LDLDIRECT" in the last 72 hours. Thyroid function studies No results for input(s): "TSH", "T4TOTAL", "T3FREE", "THYROIDAB" in the last 72 hours.  Invalid input(s): "FREET3" Anemia work up No results for input(s): "VITAMINB12", "FOLATE", "FERRITIN", "TIBC", "IRON", "RETICCTPCT" in the last 72 hours. Urinalysis    Component Value Date/Time   COLORURINE AMBER (A) 07/25/2023 0315   APPEARANCEUR CLOUDY (A) 07/25/2023 0315   APPEARANCEUR Cloudy (A) 12/15/2017 1722   LABSPEC 1.036 (H) 07/25/2023 0315   PHURINE 5.0 07/25/2023 0315   GLUCOSEU NEGATIVE 07/25/2023 0315   HGBUR NEGATIVE 07/25/2023 0315   BILIRUBINUR SMALL (A) 07/25/2023 0315   BILIRUBINUR negative 01/05/2020 1434   BILIRUBINUR Negative 12/15/2017 1722   KETONESUR 5 (A) 07/25/2023 0315   PROTEINUR 100 (A) 07/25/2023 0315   UROBILINOGEN 0.2 01/05/2020 1434   UROBILINOGEN 0.2 11/29/2018 1742   NITRITE NEGATIVE 07/25/2023 0315   LEUKOCYTESUR SMALL (A) 07/25/2023 0315   Sepsis Labs Recent Labs  Lab 07/30/23 1004 08/02/23 0859  WBC 5.9 7.7   Microbiology No results found for this or any previous visit (from the past 240 hour(s)).  FURTHER DISCHARGE INSTRUCTIONS:   Get Medicines reviewed and adjusted: Please take all your medications with you for your next visit  with your Primary MD   Laboratory/radiological data: Please request your Primary MD to go over all hospital tests and procedure/radiological results at the follow up, please ask your Primary MD to get all Hospital records sent to his/her office.   In some cases, they will be blood work, cultures and biopsy results pending at the time of your discharge. Please request that your primary care M.D. goes through all the records of your hospital data and follows up on these results.   Also Note the following: If you experience worsening of your admission symptoms, develop shortness of breath, life threatening emergency, suicidal or homicidal thoughts you must seek medical attention immediately by calling 911 or calling your MD immediately  if symptoms less severe.   You must read complete instructions/literature along with all the possible adverse reactions/side effects for all the Medicines you take and that have been prescribed to you. Take any new Medicines after you have completely understood and accpet all the possible adverse reactions/side effects.    Do not drive when taking Pain medications or sleeping medications (Benzodaizepines)   Do not take more than prescribed Pain, Sleep and Anxiety Medications. It is not advisable to combine anxiety,sleep and pain medications without talking with your primary care practitioner   Special Instructions: If you have smoked or chewed Tobacco  in the last 2 yrs please stop smoking, stop any regular Alcohol  and or any Recreational drug use.   Wear Seat belts while driving.   Please note: You were cared for by a hospitalist during  your hospital stay. Once you are discharged, your primary care physician will handle any further medical issues. Please note that NO REFILLS for any discharge medications will be authorized once you are discharged, as it is imperative that you return to your primary care physician (or establish a relationship with a primary care  physician if you do not have one) for your post hospital discharge needs so that they can reassess your need for medications and monitor your lab values  Time coordinating discharge: Over 30 minutes  SIGNED:   Hughie Closs, MD  [facility] 08/05/2023, 11:11 AM *Please note that this is a verbal dictation therefore any spelling or grammatical errors are due to the "[employer] One" system interpretation. If 7PM-7AM, please contact night-coverage [email or web address]

## 2023-08-05 NOTE — Plan of Care (Signed)
Pt resting comfortably at this time. VSS remains stable- see vitals flow sheet.  Safety maintained. Will continue to monitor.   Problem: Education: Goal: Knowledge of General Education information will improve Description: Including pain rating scale, medication(s)/side effects and non-pharmacologic comfort measures Outcome: Progressing   Problem: Health Behavior/Discharge Planning: Goal: Ability to manage health-related needs will improve Outcome: Progressing   Problem: Clinical Measurements: Goal: Ability to maintain clinical measurements within normal limits will improve Outcome: Progressing Goal: Will remain free from infection Outcome: Progressing Goal: Diagnostic test results will improve Outcome: Progressing Goal: Respiratory complications will improve Outcome: Progressing Goal: Cardiovascular complication will be avoided Outcome: Progressing   Problem: Activity: Goal: Risk for activity intolerance will decrease Outcome: Progressing   Problem: Nutrition: Goal: Adequate nutrition will be maintained Outcome: Progressing   Problem: Coping: Goal: Level of anxiety will decrease Outcome: Progressing   Problem: Elimination: Goal: Will not experience complications related to bowel motility Outcome: Progressing Goal: Will not experience complications related to urinary retention Outcome: Progressing   Problem: Pain Management: Goal: General experience of comfort will improve Outcome: Progressing   Problem: Safety: Goal: Ability to remain free from injury will improve Outcome: Progressing   Problem: Skin Integrity: Goal: Risk for impaired skin integrity will decrease Outcome: Progressing   Problem: Education: Goal: Ability to identify signs and symptoms of gastrointestinal bleeding will improve Outcome: Progressing   Problem: Bowel/Gastric: Goal: Will show no signs and symptoms of gastrointestinal bleeding Outcome: Progressing   Problem: Fluid Volume: Goal:  Will show no signs and symptoms of excessive bleeding Outcome: Progressing   Problem: Clinical Measurements: Goal: Complications related to the disease process, condition or treatment will be avoided or minimized Outcome: Progressing

## 2023-08-05 NOTE — TOC Transition Note (Signed)
[facility] - CM/SW Discharge Note   Patient Details  Name: [name] MRN: [id number] Date of Birth: 03/01/1976  Transition [facility] CM/SW Contact:  Adrian Prows, RN Phone Number: 08/05/2023, 11:24 AM   Clinical Narrative:    D/C orders received; orders also received for shower chair; spoke w/ Vaughan Basta at [facility]; he says cost of dme is app $64; spoke w/ pt in room; she says she will order the dme from [facility]; no TOC needs.   Final next level of care: Home/Self Care Barriers to Discharge: No Barriers Identified   Patient Goals and CMS Choice      Discharge Placement                         Discharge Plan and Services Additional resources added to the After Visit Summary for     Discharge Planning Services: CM Consult            DME Arranged: N/A DME Agency: NA       HH Arranged: NA HH Agency: NA        Social Determinants of Health (SDOH) Interventions SDOH Screenings   Food Insecurity: Food Insecurity Present (07/30/2023)  Housing: Low Risk  (07/30/2023)  Transportation Needs: No Transportation Needs (07/30/2023)  Utilities: At Risk (07/30/2023)  Alcohol Screen: Medium Risk (04/08/2019)  Depression (PHQ2-9): Low Risk  (01/05/2020)  Financial Resource Strain: Low Risk  (04/08/2019)  Physical Activity: Inactive (04/08/2019)  Social Connections: Unknown (01/29/2022)   Received from [facility], [facility]  Stress: Stress Concern Present (04/08/2019)  Tobacco Use: Medium Risk (07/25/2023)     Readmission Risk Interventions    07/30/2023   11:59 AM  Readmission Risk Prevention Plan  Transportation Screening Complete  PCP or Specialist Appt within 5-7 Days Complete  Home Care Screening Complete  Medication Review (RN CM) Complete

## 2023-08-12 ENCOUNTER — Encounter: Payer: Self-pay | Admitting: Surgery

## 2023-08-16 ENCOUNTER — Telehealth (HOSPITAL_COMMUNITY): Payer: Self-pay

## 2023-08-16 NOTE — Telephone Encounter (Signed)
-----   Message from Gilmer Mor sent at 08/15/2023  8:55 AM EST ----- Team good day.   Dr. Danise Edge office will be sending a request for image guided gastrostomy tube placement for this patient.   I have discussed with Dr. Dossie Der.   Please OK this patient for image guided g-tube placement.    With Dr. Loreta Ave only at [facility].  Moderate sedation.   Please page with any questions.   Thank you Gilmer Mor

## 2023-08-19 ENCOUNTER — Other Ambulatory Visit (HOSPITAL_COMMUNITY): Payer: Self-pay | Admitting: Surgery

## 2023-08-19 ENCOUNTER — Encounter (HOSPITAL_COMMUNITY): Payer: Self-pay

## 2023-08-19 ENCOUNTER — Telehealth (HOSPITAL_COMMUNITY): Payer: Self-pay

## 2023-08-19 DIAGNOSIS — K289 Gastrojejunal ulcer, unspecified as acute or chronic, without hemorrhage or perforation: Secondary | ICD-10-CM

## 2023-08-19 NOTE — Telephone Encounter (Signed)
Pt has had peg tubes in the past. I did reach out to [facility] to put in a referral for Home Health/Nutrition. Referral has been put in for [facility]. I called to confirm and spoke to Milton from [facility] [phone] ext [phone]. She is just waiting on orders for nutrition from Dr. Dossie Der which they will get sent over on 11/22. AB

## 2023-08-23 ENCOUNTER — Ambulatory Visit (HOSPITAL_COMMUNITY): Payer: 59

## 2023-08-24 ENCOUNTER — Other Ambulatory Visit: Payer: Self-pay | Admitting: Radiology

## 2023-08-24 DIAGNOSIS — R1310 Dysphagia, unspecified: Secondary | ICD-10-CM

## 2023-08-25 ENCOUNTER — Other Ambulatory Visit (HOSPITAL_COMMUNITY): Payer: Self-pay | Admitting: Surgery

## 2023-08-25 ENCOUNTER — Other Ambulatory Visit: Payer: Self-pay

## 2023-08-25 ENCOUNTER — Encounter: Payer: Self-pay | Admitting: Dietician

## 2023-08-25 ENCOUNTER — Ambulatory Visit (HOSPITAL_COMMUNITY)
Admission: RE | Admit: 2023-08-25 | Discharge: 2023-08-25 | Disposition: A | Discharge status: Home / Self Care | Payer: Medicare Other | Source: Ambulatory Visit | Attending: Surgery | Admitting: Surgery

## 2023-08-25 ENCOUNTER — Encounter: Payer: Medicare Other | Attending: Surgery | Admitting: Dietician

## 2023-08-25 ENCOUNTER — Encounter (HOSPITAL_COMMUNITY): Payer: Self-pay

## 2023-08-25 VITALS — Ht 67.0 in | Wt 131.1 lb

## 2023-08-25 DIAGNOSIS — Z9884 Bariatric surgery status: Secondary | ICD-10-CM | POA: Diagnosis not present

## 2023-08-25 DIAGNOSIS — R627 Adult failure to thrive: Secondary | ICD-10-CM | POA: Diagnosis not present

## 2023-08-25 DIAGNOSIS — Z713 Dietary counseling and surveillance: Secondary | ICD-10-CM | POA: Diagnosis not present

## 2023-08-25 DIAGNOSIS — K219 Gastro-esophageal reflux disease without esophagitis: Secondary | ICD-10-CM | POA: Diagnosis not present

## 2023-08-25 DIAGNOSIS — K289 Gastrojejunal ulcer, unspecified as acute or chronic, without hemorrhage or perforation: Secondary | ICD-10-CM | POA: Diagnosis present

## 2023-08-25 DIAGNOSIS — Z6820 Body mass index (BMI) 20.0-20.9, adult: Secondary | ICD-10-CM | POA: Insufficient documentation

## 2023-08-25 DIAGNOSIS — E46 Unspecified protein-calorie malnutrition: Secondary | ICD-10-CM | POA: Diagnosis present

## 2023-08-25 DIAGNOSIS — Z87891 Personal history of nicotine dependence: Secondary | ICD-10-CM | POA: Diagnosis not present

## 2023-08-25 DIAGNOSIS — D649 Anemia, unspecified: Secondary | ICD-10-CM | POA: Insufficient documentation

## 2023-08-25 DIAGNOSIS — R1310 Dysphagia, unspecified: Secondary | ICD-10-CM

## 2023-08-25 HISTORY — PX: IR US GUIDE VASC ACCESS LEFT: IMG2389

## 2023-08-25 HISTORY — PX: IR GASTROSTOMY TUBE MOD SED: IMG625

## 2023-08-25 LAB — CBC
HCT: 37.5 % (ref 36.0–46.0)
Hemoglobin: 11.8 g/dL — ABNORMAL LOW (ref 12.0–15.0)
MCH: 29.6 pg (ref 26.0–34.0)
MCHC: 31.5 g/dL (ref 30.0–36.0)
MCV: 94.2 fL (ref 80.0–100.0)
Platelets: 350 10*3/uL (ref 150–400)
RBC: 3.98 MIL/uL (ref 3.87–5.11)
RDW: 14.6 % (ref 11.5–15.5)
WBC: 6.3 10*3/uL (ref 4.0–10.5)
nRBC: 0 % (ref 0.0–0.2)

## 2023-08-25 LAB — PROTIME-INR
INR: 1 (ref 0.8–1.2)
Prothrombin Time: 13.1 s (ref 11.4–15.2)

## 2023-08-25 LAB — BASIC METABOLIC PANEL
Anion gap: 8 (ref 5–15)
BUN: 9 mg/dL (ref 6–20)
CO2: 26 mmol/L (ref 22–32)
Calcium: 9.8 mg/dL (ref 8.9–10.3)
Chloride: 103 mmol/L (ref 98–111)
Creatinine, Ser: 0.82 mg/dL (ref 0.44–1.00)
GFR, Estimated: >60 mL/min (ref >60)
Glucose, Bld: 69 mg/dL — ABNORMAL LOW (ref 70–99)
Potassium: 4.3 mmol/L (ref 3.5–5.1)
Sodium: 137 mmol/L (ref 135–145)

## 2023-08-25 MED ORDER — IOHEXOL 300 MG/ML  SOLN
50.0000 mL | Freq: Once | INTRAMUSCULAR | Status: AC | PRN
  Administered 2023-08-25: 15 mL

## 2023-08-25 MED ORDER — LIDOCAINE HCL 1 % IJ SOLN
INTRAMUSCULAR | Status: AC
  Filled 2023-08-25: qty 20

## 2023-08-25 MED ORDER — ONDANSETRON HCL 4 MG/2ML IJ SOLN
INTRAMUSCULAR | Status: AC
  Filled 2023-08-25: qty 2

## 2023-08-25 MED ORDER — VANCOMYCIN HCL IN DEXTROSE 1-5 GM/200ML-% IV SOLN
1000.0000 mg | INTRAVENOUS | Status: AC
  Administered 2023-08-25: 1000 mg via INTRAVENOUS

## 2023-08-25 MED ORDER — KETOROLAC TROMETHAMINE 15 MG/ML IJ SOLN: 15.0000 mg | Freq: Once | INTRAMUSCULAR | Status: DC

## 2023-08-25 MED ORDER — VANCOMYCIN HCL IN DEXTROSE 1-5 GM/200ML-% IV SOLN
INTRAVENOUS | Status: AC
  Filled 2023-08-25: qty 200

## 2023-08-25 MED ORDER — MIDAZOLAM HCL 2 MG/2ML IJ SOLN
INTRAMUSCULAR | Status: AC
  Filled 2023-08-25: qty 2

## 2023-08-25 MED ORDER — FENTANYL CITRATE (PF) 100 MCG/2ML IJ SOLN
INTRAMUSCULAR | Status: AC
  Filled 2023-08-25: qty 2

## 2023-08-25 MED ORDER — SODIUM CHLORIDE 0.9% FLUSH: 10.0000 mL | Freq: Two times a day (BID) | INTRAVENOUS | Status: DC

## 2023-08-25 MED ORDER — KETOROLAC TROMETHAMINE 15 MG/ML IJ SOLN
INTRAMUSCULAR | Status: AC | PRN
  Administered 2023-08-25: 15 mg via INTRAVENOUS

## 2023-08-25 MED ORDER — MIDAZOLAM HCL 2 MG/2ML IJ SOLN
INTRAMUSCULAR | Status: AC | PRN
  Administered 2023-08-25 (×3): .5 mg via INTRAVENOUS
  Administered 2023-08-25: 1 mg via INTRAVENOUS
  Administered 2023-08-25: .5 mg via INTRAVENOUS

## 2023-08-25 MED ORDER — FENTANYL CITRATE (PF) 100 MCG/2ML IJ SOLN
INTRAMUSCULAR | Status: AC | PRN
  Administered 2023-08-25 (×4): 25 ug via INTRAVENOUS
  Administered 2023-08-25: 50 ug via INTRAVENOUS

## 2023-08-25 MED ORDER — KETOROLAC TROMETHAMINE 30 MG/ML IJ SOLN
INTRAMUSCULAR | Status: AC
  Filled 2023-08-25: qty 1

## 2023-08-25 MED ORDER — MIDAZOLAM HCL 2 MG/2ML IJ SOLN
INTRAMUSCULAR | Status: AC
Start: 2023-08-25 — End: ?
  Filled 2023-08-25: qty 2

## 2023-08-25 MED ORDER — ONDANSETRON HCL 4 MG/2ML IJ SOLN
INTRAMUSCULAR | Status: AC | PRN
  Administered 2023-08-25: 4 mg via INTRAVENOUS

## 2023-08-25 NOTE — Procedures (Signed)
Interventional Radiology Procedure Note  Procedure:   Image guided attempt at percutaneous feeding tube into the duodenal limb. Cone-beam, fluoro, and [place] use.  Unable to reach a lumenal position after several attempts.   Complications: None  Recommendations:  - 1 hr recovery for sedation recovery, then DC home - Ice prn - PRN's for analgesia - Do not submerge for 7 days - 15mg  IV toradol now  Signed,  Yvone Neu. Loreta Ave, DO

## 2023-08-25 NOTE — Progress Notes (Signed)
Medical Nutrition Therapy Pre Gastrostomy Tube Placement  Appointment Start time:  11:05  Appointment End time:  11:55  Primary concerns today: malnutrition (pre gastrostomy tube placement)  Referral diagnosis: Malnutrition, unspecified  NUTRITION ASSESSMENT   Anthropometrics Height: 67 in Weight today: 131.1 lbs  Body Composition Scale 08/25/2023  Current Body Weight 131.1  Total Body Fat % 20.4  Visceral Fat 4  Fat-Free Mass % 79.5   Total Body Water % 54.2  Muscle-Mass lbs 32.6  BMI 20.2  Body Fat Displacement          Torso  lbs 16.5         Left Leg  lbs 3.3         Right Leg  lbs 3.3         Left Arm  lbs 1.6         Right Arm  lbs 1.6   Clinical Medical Hx: sleeve gastrectomy; 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 Pt has had tube feedings in the past. Pt states 95% of her intake by mouth is protein shakes. Pt states she can eat milk shakes. Pt states she can tolerate candy. Pt states ulcers are painful, stating it feels like eating thumb tacks. 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 Pt states water tastes like dirt or metal and can't keep it down. Pt states everything acidic is not tolerated. Pt states she has falls frequently, stating it may be related to low blood pressure and not getting enough water. Pt states she takes medication for low blood pressure daily. Pt states when she bends over to fast/far she gets the hiccups. Pt states she has chronic ulcerative colitis, stating she has  constipation. Pt states she is aiming to regain weight to about 150-160 lbs.  Estimated daily fluid intake: 30 oz (includes protein shakes) sometimes more on a good day. Supplements: multivitamin and calcium Protein intake: 30-50 grams per day  24-Hr Dietary Recall First Meal: partial protein shake with medications Snack:  Second Meal: protein shake (partial) Snack: saltine crackers (hold in mouth to soften) Third Meal: protein shake or milkshake Snack:  Beverages: sweet tea, water  Estimated Energy Needs Calorie Needs 25-30kcal/kg (60 kg) 1500-1800 kcal  NUTRITION DIAGNOSIS  NI-2.1 Inadequate intake As related to gastric and jejunal ulcers causing pain and not tolerating food by mouth resulting in malnutrition.  As evidenced by labs and decreased weight.  NUTRITION INTERVENTION  Tube Feeding Recommendations:  Formula Option  Osmolite 1.5 OSMOLITE 1.5 CAL is therapeutic nutrition that provides complete, balanced nutrition for long- or short-term tube feeding for patients with increased calorie and protein needs, or for those with limited volume tolerance. For supplemental or sole-source nutrition. Osmolite 1.5 at 35mL/hr continuous (start at 84mL/hr and increase by 10mL every 4 hours to goal). Provides 1620kcal, 68g protein, and H2O. Free water: 30 ml q 8 hours  Recommendation to increase protein:  Add 60 ml Prosource TF 20 BID (Provides 160 kcals and 40g protein)  Pt encouraged to drink and eat by mouth as tolerated.  Learning Style & Readiness for Change Teaching method utilized: Visual &  Auditory  Demonstrated degree of understanding via: Teach Back  Barriers to learning/adherence to lifestyle change: gastric/jejunal ulcers  MONITORING & EVALUATION Dietary intake/tolerance, tube feeding, weight.  Next Steps  Patient is to follow-up in 1 month.

## 2023-08-25 NOTE — H&P (Signed)
Chief Complaint: Patient was seen in consultation today for percutaneous gastric tube placement at the request of Stechschulte,Paul J  Referring Physician(s): Stechschulte,Paul J  Supervising Physician: Gilmer Mor  Patient Status: [facility] - Out-pt  History of Present Illness: [name] is a 47 y.o. female  FULL Code status per Pt Hx anxiety/depression GERD Roux En Y surgery 2019 Recent admission for orthostatic hypotension Anemia secondary UGI bleed  EGD 10/27 >> Patent Billroth I gastroduodenostomy was found,characterized by ulceration, Oozing gastric ulcers with a visible vessel.Treated with bipolar cautery.  General surgery consulted as this ulcer has been present for 6 months despite optimal medical therapy with PPI and Carafate.Diet advanced to bariatric advanced  diet. Patient was found to have orthostatic hypotension and dizziness  Continues to have poor po intake Failure to thrive  Request per [facility] - Dr Dossie Der for percutaneous gastric tube placement  Dr Loreta Ave has spoken to [facility] at length Plan to move forward with placement today  Past Medical History:  Diagnosis Date   Anal fissure - posterior 10/16/2014    OCC  ISSUES   Anxiety    doesn't take anything   Asthma    has Albuterol inhaler as needed   Boil    on pubic area started septra 03-01-17 draining blood and pus   Bronchitis    Chronic headache disorder 07/22/2016   Clostridium difficile infection 04/20/2012   Colitis    Dehydration    Depression    Family history of adverse reaction to anesthesia    pt mom gets sick   GERD (gastroesophageal reflux disease)    takes Pantoprazole daily   Headache    Hiatal hernia    neuropathy - mild in arms and legs   History of blood transfusion    last transfusion was 04/04/2016=Benadryl was given d/t itching. States she always itches with transfusion.    History of bronchitis    > 2 yrs ago   History of colon polyps    benign   History of  migraine    last one 05/01/16   History of stomach ulcers    History of urinary tract infection LAST 2 WEEKS AGO   IDA (iron deficiency anemia)    Internal and external bleeding hemorrhoids 06/11/2014   Joint pain    Joint swelling    Left sided chronic colitis - segmental 06/11/2014   Marginal ulcer 10/25/2017   Migraine    Motion sickness    Nausea    takes Zofran as needed   Nausea and vomiting    for 1 year   Obesity    Oligouria    Osteoarthritis    lower back, knees, wrists - no meds   Peripheral neuropathy 03/01/2019   Pneumonia 1997   Postoperative nausea and vomiting 01/21/2016   wants scopolamine patch   Right carpal tunnel syndrome 03/01/2019   SVD (spontaneous vaginal delivery)    x 4   Tingling    BOTH LOWER EXTREMETIES ALL THE TIME DUE TO RAPID WEIGHT LOSS   TOBACCO USER 10/02/2009   Qualifier: Diagnosis of  By: Carolyne Fiscal MD, Clayburn Pert     Transfusion history    last transfusion 6'16    Tremor 08/31/2018   UC (ulcerative colitis) (HCC)    supposed to be taking Lialda and Bentyl but has been off since gastric sleeve   Vertigo    doesn't take any meds    Past Surgical History:  Procedure Laterality Date   ABDOMINAL HYSTERECTOMY  PARTIAL   BIOPSY  01/20/2023   Procedure: BIOPSY;  Surgeon: Kerin Salen, MD;  Location: [facility];  Service: Gastroenterology;;   COLONOSCOPY  2007   for rectal bleeding; [facility]   COLONOSCOPY N/A 06/11/2014   Procedure: COLONOSCOPY;  Surgeon: Iva Boop, MD;  Location: [facility];  Service: Endoscopy;  Laterality: N/A;   COLONOSCOPY WITH PROPOFOL N/A 03/02/2017   Procedure: COLONOSCOPY WITH PROPOFOL;  Surgeon: Ovidio Kin, MD;  Location: [facility];  Service: General;  Laterality: N/A;   DILATION AND CURETTAGE OF UTERUS     ESOPHAGOGASTRODUODENOSCOPY N/A 07/25/2023   Procedure: ESOPHAGOGASTRODUODENOSCOPY (EGD);  Surgeon: Lynann Bologna, DO;  Location: [facility];  Service: Gastroenterology;  Laterality: N/A;    ESOPHAGOGASTRODUODENOSCOPY (EGD) WITH PROPOFOL N/A 04/06/2016   Procedure: ESOPHAGOGASTRODUODENOSCOPY (EGD) WITH PROPOFOL;  Surgeon: Ovidio Kin, MD;  Location: [facility];  Service: General;  Laterality: N/A;   ESOPHAGOGASTRODUODENOSCOPY (EGD) WITH PROPOFOL N/A 03/02/2017   Procedure: ESOPHAGOGASTRODUODENOSCOPY (EGD) WITH PROPOFOL;  Surgeon: Ovidio Kin, MD;  Location: [facility];  Service: General;  Laterality: N/A;   ESOPHAGOGASTRODUODENOSCOPY (EGD) WITH PROPOFOL N/A 05/20/2017   Procedure: ESOPHAGOGASTRODUODENOSCOPY (EGD) WITH PROPOFOL ERAS PATHWAY;  Surgeon: Ovidio Kin, MD;  Location: [facility];  Service: General;  Laterality: N/A;   ESOPHAGOGASTRODUODENOSCOPY (EGD) WITH PROPOFOL N/A 10/07/2017   Procedure: ESOPHAGOGASTRODUODENOSCOPY (EGD) WITH PROPOFOL;  Surgeon: Ovidio Kin, MD;  Location: [facility];  Service: General;  Laterality: N/A;   ESOPHAGOGASTRODUODENOSCOPY (EGD) WITH PROPOFOL N/A 01/20/2023   Procedure: ESOPHAGOGASTRODUODENOSCOPY (EGD) WITH PROPOFOL;  Surgeon: Kerin Salen, MD;  Location: [facility];  Service: Gastroenterology;  Laterality: N/A;   EXCISION OF SKIN TAG  06/08/2017   Procedure: EXCISION OF VULVAR SKIN TAGS X2;  Surgeon: Reva Bores, MD;  Location: [facility];  Service: Gynecology;;   FOOT SURGERY Bilateral    x 2   GASTRIC ROUX-EN-Y N/A 12/14/2016   Procedure: LAPAROSCOPIC REVISION SLEEVE GASTRECTOMY TO  ROUX-Y-GASTRIC BY-PASS, UPPER ENDO;  Surgeon: Glenna Fellows, MD;  Location: [facility];  Service: General;  Laterality: N/A;   GASTROJEJUNOSTOMY N/A 05/11/2016   Procedure: LAPAROSCOPIC PLACEMENT  OF FEEDING  JEJUNOSTOMY TUBE;  Surgeon: Glenna Fellows, MD;  Location: [facility];  Service: General;  Laterality: N/A;   HEMORRHOIDECTOMY WITH HEMORRHOID BANDING     HEMOSTASIS CLIP PLACEMENT  07/25/2023   Procedure: HEMOSTASIS CLIP PLACEMENT;  Surgeon: Lynann Bologna, DO;  Location: [facility];  Service: Gastroenterology;;   HOT HEMOSTASIS N/A 07/25/2023    Procedure: HOT HEMOSTASIS (ARGON PLASMA COAGULATION/BICAP);  Surgeon: Lynann Bologna, DO;  Location: [facility];  Service: Gastroenterology;  Laterality: N/A;   IR FLUORO GUIDE CV LINE RIGHT  04/06/2017   IR GENERIC HISTORICAL  05/19/2016   IR CM INJ ANY COLONIC TUBE W/FLUORO 05/19/2016 Irish Lack, MD [facility]   IR PATIENT EVAL TECH 0-60 MINS  11/30/2017   IR REPLC DUODEN/JEJUNO TUBE PERCUT W/FLUORO  03/14/2018   IR REPLC DUODEN/JEJUNO TUBE PERCUT W/FLUORO  03/15/2018   IR US GUIDE VASC ACCESS RIGHT  04/06/2017   j tube removed march 2018     KNEE ARTHROSCOPY Left 09/06/2014   LAPAROSCOPIC GASTRIC SLEEVE RESECTION N/A 01/14/2016   Procedure: LAPAROSCOPIC GASTRIC SLEEVE RESECTION;  Surgeon: Glenna Fellows, MD;  Location: [facility];  Service: General;  Laterality: N/A;   LAPAROSCOPIC REVISION OF GASTROJEJUNOSTOMY N/A 10/25/2017   Procedure: LAPAROSCOPIC REVISION OF GASTROJEJUNOSTOMY AND PARTIAL GASTRECTOMY, WITH PLACEMENT OF FEEDING GASTROSTOMY TUBE;  Surgeon: Glenna Fellows, MD;  Location: [facility];  Service: General;  Laterality:  N/A;   LAPAROSCOPIC TUBAL LIGATION  10/16/2011   Procedure: LAPAROSCOPIC TUBAL LIGATION;  Surgeon: Esmeralda Arthur, MD;  Location: [facility];  Service: Gynecology;  Laterality: Bilateral;   NOVASURE ABLATION  09/28/2010   mild persistent vaginal bleeding   right knee arthroscopy     05/07/2016   TOTAL KNEE ARTHROPLASTY Left 03/10/2018   Procedure: LEFT TOTAL KNEE ARTHROPLASTY;  Surgeon: Durene Romans, MD;  Location: [facility];  Service: Orthopedics;  Laterality: Left;  70 mins   TOTAL KNEE ARTHROPLASTY Right 01/23/2020   Procedure: TOTAL KNEE ARTHROPLASTY, BILATERAL TROCHANTERIC INJECTION;  Surgeon: Durene Romans, MD;  Location: [facility];  Service: Orthopedics;  Laterality: Right;  70 mins for Bilateral Troch injection 1 cc Depo and 2 CC Lidocaine   TUBAL LIGATION     VAGINAL HYSTERECTOMY Bilateral 06/08/2017   Procedure: HYSTERECTOMY VAGINAL W/ BILATERAL  SALPINGECTOMY;  Surgeon: Reva Bores, MD;  Location: [facility];  Service: Gynecology;  Laterality: Bilateral;   wisdom teeth extracted      Allergies: Ciprofloxacin, Coconut (cocos nucifera), Coconut oil, Morphine and codeine, Oxycodone, Oxycodone-acetaminophen, Doxycycline, Penicillamine, Trazodone and nefazodone, Vistaril [hydroxyzine hcl], Silicone, Penicillins, and Tape  Medications: Prior to Admission medications   Medication Sig Start Date End Date Taking? Authorizing Provider  albuterol (VENTOLIN HFA) 108 (90 Base) MCG/ACT inhaler Inhale 2 puffs into the lungs every 6 (six) hours as needed for wheezing or shortness of breath.   Yes [provider]  Atogepant (QULIPTA) 30 MG TABS Take 1 tablet (30 mg total) by mouth daily. 05/05/23  Yes Glean Salvo, NP  atorvastatin (LIPITOR) 40 MG tablet Take 40 mg by mouth daily. 12/10/22  Yes [provider]  buPROPion (WELLBUTRIN XL) 300 MG 24 hr tablet Take 300 mg by mouth every morning. Take along with 150 mg=450 mg 06/05/20  Yes [provider]  busPIRone (BUSPAR) 15 MG tablet Take 15 mg by mouth 3 (three) times daily.   Yes [provider]  Cholecalciferol (VITAMIN D3) 1.25 MG (50000 UT) CAPS Take 50,000 Units by mouth every 7 (seven) days.  04/03/19  Yes [provider]  clonazePAM (KLONOPIN) 1 MG tablet Take 1 mg by mouth 2 (two) times daily as needed for anxiety.   Yes [provider]  cyanocobalamin (,VITAMIN B-12,) 1000 MCG/ML injection Inject 1,000 mcg into the muscle 2 (two) times a week.  04/06/19  Yes [provider]  diclofenac Sodium (VOLTAREN) 1 % GEL Apply topically 4 (four) times daily.   Yes [provider]  dicyclomine (BENTYL) 10 MG capsule Take 10 mg by mouth 2 (two) times daily as needed for spasms. 12/17/20  Yes [provider]  DULoxetine (CYMBALTA) 60 MG capsule Take 1 capsule (60 mg total) by mouth 2 (two) times daily. 04/10/19  Yes Aldean Baker, NP   ferrous sulfate 325 (65 FE) MG tablet Take 1 tablet (325 mg total) by mouth daily with breakfast. 01/29/23  Yes Glade Lloyd, MD  fluticasone (FLONASE) 50 MCG/ACT nasal spray Place 2 sprays into both nostrils daily as needed for allergies or rhinitis.   Yes [provider]  gabapentin (NEURONTIN) 600 MG tablet Take 600 mg by mouth 3 (three) times daily.   Yes [provider]  hydrocortisone (ANUSOL-HC) 2.5 % rectal cream Place 1 Application rectally daily as needed for hemorrhoids. 07/29/18  Yes [provider]  HYDROmorphone (DILAUDID) 8 MG tablet Take 0.5 tablets (4 mg total) by mouth every 3 (three) hours. 03/08/23  Yes  linaclotide (LINZESS) 290 MCG CAPS capsule Take 290 mcg by mouth daily before breakfast.   Yes [provider]  magnesium oxide (MAG-OX) 400 (240 Mg) MG tablet Take 1 tablet by mouth daily. 12/19/22  Yes [provider]  meclizine (ANTIVERT) 25 MG tablet Take 25 mg by mouth 3 (three) times daily. 04/15/22  Yes [provider]  mesalamine (LIALDA) 1.2 g EC tablet Take 2.4 g by mouth daily with breakfast.   Yes [provider]  midodrine (PROAMATINE) 5 MG tablet Take 3 tablets (15 mg total) by mouth 3 (three) times daily with meals. 08/04/23 09/03/23 Yes Pahwani, Daleen Bo, MD  mirtazapine (REMERON) 15 MG tablet Take 15 mg by mouth at bedtime as needed (sleep).   Yes [provider]  Multiple Vitamin (MULTIVITAMIN WITH MINERALS) TABS tablet Take 1 tablet by mouth daily.   Yes [provider]  omeprazole (KONVOMEP) 2 mg/mL SUSP oral suspension Take 10 mLs (20 mg total) by mouth 2 (two) times daily before a meal. 08/04/23 09/03/23 Yes Pahwani, Ravi, MD  ondansetron (ZOFRAN) 8 MG tablet Take 8 mg by mouth every 8 (eight) hours as needed for nausea or vomiting.   Yes [provider]  oxybutynin (DITROPAN) 5 MG tablet Take 10 mg by mouth at bedtime.   Yes [provider]  sucralfate (CARAFATE) 1 g  tablet Take 1 tablet (1 g total) by mouth every 6 (six) hours. 01/21/23  Yes Arnetha Courser, MD  SUMAtriptan (IMITREX) 50 MG tablet Take 1 tablet (50 mg total) by mouth every 2 (two) hours as needed for migraine. May repeat in 2 hours if headache persists or recurs. 05/05/23  Yes Glean Salvo, NP  SYMPROIC 0.2 MG TABS Take 1 tablet by mouth daily. 05/13/21  Yes [provider]  thiamine (VITAMIN B-1) 100 MG tablet Take 100 mg by mouth daily at 12 noon.    Yes [provider]  torsemide (DEMADEX) 10 MG tablet Take 10 mg by mouth daily. 11/10/21  Yes [provider]  botulinum toxin Type A (BOTOX) 200 units injection INJECT 155 UNITS INTRAMUSCULARLY INTO HEAD AND NECK MUSCLES EVERY 3 MONTHS 11/23/22   Levert Feinstein, MD  buPROPion (WELLBUTRIN XL) 150 MG 24 hr tablet Take 150 mg by mouth every morning. Take along with 300 mg=450 mg    [provider]  EPINEPHrine 0.3 mg/0.3 mL IJ SOAJ injection Inject 0.3 mg into the muscle as needed for anaphylaxis. 04/12/21   Haskel Schroeder, PA-C  NARCAN 4 MG/0.1ML LIQD nasal spray kit Place 1 spray into the nose daily as needed (overdose). 03/18/20   [provider]  valACYclovir (VALTREX) 1000 MG tablet Take 1,000 mg by mouth 2 (two) times daily as needed.    [provider]     Family History  Problem Relation Age of Onset   Hypertension Mother    Diabetes Mother    Sarcoidosis Mother        lungs and skin   Asthma Mother    Hypertension Father    Diabetes Father    Asthma Son    Tics Son    Asthma Sister    Cancer Sister        possible pancreatic cancer   Adrenal disorder Sister        Tumor    Asthma Brother    Asthma Daughter        died age 45.5   Cancer Daughter 4       brain;  died age 42.5   Asthma Son    Cancer Paternal Aunt        brain, colon, lung and esophagus; unsure of primary    Breast cancer Paternal Aunt     Social History   Socioeconomic History   Marital status: Single     Spouse name: Not on file   Number of children: 3   Years of education: Some college   Highest education level: Not on file  Occupational History   Occupation: English as a second language teacher    Comment: Currently out on disability d/t surg  Tobacco Use   Smoking status: Former    Current packs/day: 0.00    Average packs/day: 0.5 packs/day for 20.0 years (10.0 ttl pk-yrs)    Types: Cigarettes    Start date: 03/17/1994    Quit date: 03/17/2014    Years since quitting: 9.4   Smokeless tobacco: Never  Vaping Use   Vaping status: Never Used  Substance and Sexual Activity   Alcohol use: Yes    Alcohol/week: 0.0 standard drinks of alcohol   Drug use: No   Sexual activity: Not Currently    Birth control/protection: Surgical, Abstinence  Other Topics Concern   Not on file  Social History Narrative   Lives with 2 sons   Right Handed   Drinks caffeine2-3 cups daily   Social Determinants of Health   Financial Resource Strain: Low Risk  (04/08/2019)   Overall Financial Resource Strain (CARDIA)    Difficulty of Paying Living Expenses: Not hard at all  Food Insecurity: Food Insecurity Present (07/30/2023)   Hunger Vital Sign    Worried About Running Out of Food in the Last Year: Sometimes true    Ran Out of Food in the Last Year: Sometimes true  Transportation Needs: No Transportation Needs (07/30/2023)   PRAPARE - Administrator, Civil Service (Medical): No    Lack of Transportation (Non-Medical): No  Physical Activity: Inactive (04/08/2019)   Exercise Vital Sign    Days of Exercise per Week: 0 days    Minutes of Exercise per Session: 0 min  Stress: Stress Concern Present (04/08/2019)   [employer] of Occupational Health - Occupational Stress Questionnaire    Feeling of Stress : To some extent  Social Connections: Unknown (01/29/2022)   Received from [facility]   Social Network    Social Network: Not on file    Review of Systems: A 12 point ROS discussed and  pertinent positives are indicated in the HPI above.  All other systems are negative.  Review of Systems  Constitutional:  Positive for activity change, fatigue and unexpected weight change. Negative for fever.  Respiratory:  Negative for cough and shortness of breath.   Cardiovascular:  Negative for chest pain.  Gastrointestinal:  Positive for abdominal pain and nausea.  Neurological:  Positive for weakness.  Psychiatric/Behavioral:  Negative for behavioral problems and confusion.     Vital Signs: BP (!) 143/100   Pulse 69   Temp 99.2 F (37.3 C) (Oral)   Resp 16   Ht 5\' 7"  (1.702 m)   Wt 130 lb (59 kg)   LMP 06/02/2017 Comment: hysterectomy 06/08/2017  SpO2 100%   BMI 20.36 kg/m     Physical Exam Vitals reviewed.  HENT:     Mouth/Throat:     Mouth: Mucous membranes are moist.  Cardiovascular:     Rate and Rhythm: Normal rate and regular rhythm.     Heart sounds:  Normal heart sounds.  Pulmonary:     Effort: Pulmonary effort is normal.     Breath sounds: Normal breath sounds. No wheezing.  Abdominal:     Palpations: Abdomen is soft.     Tenderness: There is no abdominal tenderness.  Musculoskeletal:        General: Normal range of motion.  Skin:    General: Skin is warm.  Neurological:     Mental Status: She is alert and oriented to person, place, and time.  Psychiatric:        Behavior: Behavior normal.     Imaging: No results found.  Labs:  CBC: Recent Labs    07/26/23 0027 07/26/23 0505 07/27/23 0506 07/30/23 1004 08/02/23 0859  WBC 8.0  --  5.7 5.9 7.7  HGB 9.8* 8.8* 9.0* 10.0* 10.4*  HCT 31.1* 28.3* 30.1* 33.6* 34.6*  PLT 280  --  259 337 413*    COAGS: No results for input(s): "INR", "APTT" in the last 8760 hours.  BMP: Recent Labs    07/26/23 0027 07/27/23 0506 07/30/23 1004 08/02/23 0326  NA 139 140 141 142  K 3.7 3.9 4.0 4.1  CL 108 107 108 109  CO2 25 25 25 28   GLUCOSE 73 74 95 80  BUN 15 12 8 12   CALCIUM 8.7* 9.0 9.3 8.7*   CREATININE 0.63 0.61 0.61 0.76  GFRNONAA >60 >60 >60 >60    LIVER FUNCTION TESTS: Recent Labs    01/28/23 0716 02/01/23 1148 02/28/23 1707 07/25/23 0315  BILITOT 0.3 0.3 0.2* 0.6  AST 10* 9* 15 15  ALT 7 8 13 17   ALKPHOS 48 48 62 67  PROT 5.4* 5.5* 6.0* 7.2  ALBUMIN 2.6* 2.6* 3.2* 3.6    TUMOR MARKERS: No results for input(s): "AFPTM", "CEA", "CA199", "CHROMGRNA" in the last 8760 hours.  Assessment and Plan:  Scheduled for percutaneous gastric tube placement Risks and benefits image guided gastrostomy tube placement was discussed with the patient including, but not limited to the need for a barium enema during the procedure, bleeding, infection, peritonitis and/or damage to adjacent structures.  All of the patient's questions were answered, patient is agreeable to proceed.  Consent signed and in chart.  Thank you for this interesting consult.  I greatly enjoyed meeting GURTHA LINDWALL and look forward to participating in their care.  A copy of this report was sent to the requesting provider on this date.  Electronically Signed: Robet Leu, PA-C 08/25/2023, 1:10 PM   I spent a total of  30 Minutes   in face to face in clinical consultation, greater than 50% of which was counseling/coordinating care for percutaneous gastric tube placement

## 2023-09-02 NOTE — Patient Instructions (Addendum)
SURGICAL WAITING ROOM VISITATION  Patients having surgery or a procedure may have no more than 2 support people in the waiting area - these visitors may rotate.    Children under the age of 36 must have an adult with them who is not the patient.  Due to an increase in RSV and influenza rates and associated hospitalizations, children ages 31 and under may not visit patients in [facility] hospitals.  If the patient needs to stay at the hospital during part of their recovery, the visitor guidelines for inpatient rooms apply. Pre-op nurse will coordinate an appropriate time for 1 support person to accompany patient in pre-op.  This support person may not rotate.    Please refer to the [facility] website for the visitor guidelines for Inpatients (after your surgery is over and you are in a regular room).       Your procedure is scheduled on: Tuesday, Dec. 17, 2024   Report to [facility] Main Entrance    Report to admitting at 5:15 AM   Call this number if you have problems the morning of surgery [phone]   Do not eat food or drink :After Midnight.  FOLLOW BOWEL PREP AND ANY ADDITIONAL PRE OP INSTRUCTIONS YOU RECEIVED FROM YOUR SURGEON'S OFFICE!!!     Oral Hygiene is also important to reduce your risk of infection.                                    Remember - BRUSH YOUR TEETH THE MORNING OF SURGERY WITH YOUR REGULAR TOOTHPASTE  DENTURES WILL BE REMOVED PRIOR TO SURGERY PLEASE DO NOT APPLY "Poly grip" OR ADHESIVES!!!   Do NOT smoke after Midnight   Stop all vitamins and herbal supplements 7 days before surgery.   Take these medicines the morning of surgery with A SIP OF WATER:                               You may not have any metal on your body including hair pins, jewelry, and body piercing             Do not wear make-up, lotions, powders, perfumes/cologne, or deodorant  Do not wear nail polish including gel and S&S, artificial/acrylic nails, or any other type  of covering on natural nails including finger and toenails. If you have artificial nails, gel coating, etc. that needs to be removed by a nail salon please have this removed prior to surgery or surgery may need to be canceled/ delayed if the surgeon/ anesthesia feels like they are unable to be safely monitored.   Do not shave  48 hours prior to surgery.    Do not bring valuables to the hospital. [facility] IS NOT             RESPONSIBLE   FOR VALUABLES.   Contacts, glasses, dentures or bridgework may not be worn into surgery.   Bring small overnight bag day of surgery.   DO NOT BRING YOUR HOME MEDICATIONS TO THE HOSPITAL. PHARMACY WILL DISPENSE MEDICATIONS LISTED ON YOUR MEDICATION LIST TO YOU DURING YOUR ADMISSION IN THE HOSPITAL!    Patients discharged on the day of surgery will not be allowed to drive home.  Someone NEEDS to stay with you for the first 24 hours after anesthesia.   Special Instructions: Bring a copy of  your healthcare power of attorney and living will documents the day of surgery if you haven't scanned them before.              Please read over the following fact sheets you were given: IF YOU HAVE QUESTIONS ABOUT YOUR PRE-OP INSTRUCTIONS PLEASE CALL [phone]   If you received a COVID test during your pre-op visit  it is requested that you wear a mask when out in public, stay away from anyone that may not be feeling well and notify your surgeon if you develop symptoms. If you test positive for Covid or have been in contact with anyone that has tested positive in the last 10 days please notify you surgeon.    [facility] - Preparing for Surgery Before surgery, you can play an important role.  Because skin is not sterile, your skin needs to be as free of germs as possible.  You can reduce the number of germs on your skin by washing with CHG (chlorahexidine gluconate) soap before surgery.  CHG is an antiseptic cleaner which kills germs and bonds with the skin to continue  killing germs even after washing. Please DO NOT use if you have an allergy to CHG or antibacterial soaps.  If your skin becomes reddened/irritated stop using the CHG and inform your nurse when you arrive at Short Stay. Do not shave (including legs and underarms) for at least 48 hours prior to the first CHG shower.  You may shave your face/neck.  Please follow these instructions carefully:  1.  Shower with CHG Soap the night before surgery and the  morning of surgery.  2.  If you choose to wash your hair, wash your hair first as usual with your normal  shampoo.  3.  After you shampoo, rinse your hair and body thoroughly to remove the shampoo.                             4.  Use CHG as you would any other liquid soap.  You can apply chg directly to the skin and wash.  Gently with a scrungie or clean washcloth.  5.  Apply the CHG Soap to your body ONLY FROM THE NECK DOWN.   Do   not use on face/ open                           Wound or open sores. Avoid contact with eyes, ears mouth and   genitals (private parts).                       Wash face,  Genitals (private parts) with your normal soap.             6.  Wash thoroughly, paying special attention to the area where your    surgery  will be performed.  7.  Thoroughly rinse your body with warm water from the neck down.  8.  DO NOT shower/wash with your normal soap after using and rinsing off the CHG Soap.                9.  Pat yourself dry with a clean towel.            10.  Wear clean pajamas.            11.  Place clean sheets on your bed the night of your  first shower and do not  sleep with pets. Day of Surgery : Do not apply any lotions/deodorants the morning of surgery.  Please wear clean clothes to the hospital/surgery center.  FAILURE TO FOLLOW THESE INSTRUCTIONS MAY RESULT IN THE CANCELLATION OF YOUR SURGERY  PATIENT SIGNATURE_________________________________  NURSE  SIGNATURE__________________________________  ________________________________________________________________________

## 2023-09-06 NOTE — Progress Notes (Signed)
Sent message, via epic in basket, requesting orders in epic from surgeon.  

## 2023-09-07 ENCOUNTER — Ambulatory Visit: Payer: Self-pay | Admitting: Surgery

## 2023-09-08 ENCOUNTER — Encounter (HOSPITAL_COMMUNITY): Payer: Self-pay

## 2023-09-08 ENCOUNTER — Encounter (HOSPITAL_COMMUNITY)
Admission: RE | Admit: 2023-09-08 | Discharge: 2023-09-08 | Disposition: A | Discharge status: Home / Self Care | Payer: Medicare Other | Source: Ambulatory Visit | Attending: Surgery | Admitting: Surgery

## 2023-09-08 ENCOUNTER — Other Ambulatory Visit: Payer: Self-pay

## 2023-09-08 VITALS — BP 133/94 | HR 87 | Temp 99.1°F | Resp 18 | Ht 67.0 in | Wt 125.0 lb

## 2023-09-08 DIAGNOSIS — K219 Gastro-esophageal reflux disease without esophagitis: Secondary | ICD-10-CM | POA: Diagnosis not present

## 2023-09-08 DIAGNOSIS — J45909 Unspecified asthma, uncomplicated: Secondary | ICD-10-CM | POA: Insufficient documentation

## 2023-09-08 DIAGNOSIS — K5190 Ulcerative colitis, unspecified, without complications: Secondary | ICD-10-CM | POA: Insufficient documentation

## 2023-09-08 DIAGNOSIS — Z9884 Bariatric surgery status: Secondary | ICD-10-CM | POA: Insufficient documentation

## 2023-09-08 DIAGNOSIS — Z01812 Encounter for preprocedural laboratory examination: Secondary | ICD-10-CM | POA: Diagnosis present

## 2023-09-08 DIAGNOSIS — D509 Iron deficiency anemia, unspecified: Secondary | ICD-10-CM | POA: Diagnosis not present

## 2023-09-08 HISTORY — DX: Bariatric surgery status: Z98.84

## 2023-09-08 LAB — CBC
HCT: 32.6 % — ABNORMAL LOW (ref 36.0–46.0)
Hemoglobin: 10.4 g/dL — ABNORMAL LOW (ref 12.0–15.0)
MCH: 30.8 pg (ref 26.0–34.0)
MCHC: 31.9 g/dL (ref 30.0–36.0)
MCV: 96.4 fL (ref 80.0–100.0)
Platelets: 551 10*3/uL — ABNORMAL HIGH (ref 150–400)
RBC: 3.38 MIL/uL — ABNORMAL LOW (ref 3.87–5.11)
RDW: 14.1 % (ref 11.5–15.5)
WBC: 7.8 10*3/uL (ref 4.0–10.5)
nRBC: 0 % (ref 0.0–0.2)

## 2023-09-08 NOTE — Progress Notes (Signed)
Anesthesia note:    PCP - none Nutr/Diab- [name] Neurology- Margie Ege NP, Ocie Doyne MD Urology-[name] Cardiologist -none Other-   Chest x-ray - no EKG - 02/01/23 Stress Test - no ECHO - no Cardiac Cath - no CABG-no Pacemaker/ICD device last checked:no  Sleep Study - no CPAP -   Pt is pre diabetic-no CBG at PAT visit- Fasting Blood Sugar at home- Checks Blood Sugar _____  Blood Thinner:no Blood Thinner Instructions: Aspirin Instructions: Last Dose:  Anesthesia review: Yes  reason:  Patient denies shortness of breath, fever, cough and chest pain at PAT appointment. Pt has unsteady gait and uses a walker. She gets dizzy if she moved too fast.   Patient verbalized understanding of instructions that were given to them at the PAT appointment. Patient was also instructed that they will need to review over the PAT instructions again at home before surgery.yes

## 2023-09-08 NOTE — Patient Instructions (Addendum)
DUE TO COVID-19 ONLY TWO VISITORS  (aged 45 and older)  ARE ALLOWED TO COME WITH YOU AND STAY IN THE WAITING ROOM ONLY DURING PRE OP AND PROCEDURE.   **NO VISITORS ARE ALLOWED IN THE SHORT STAY AREA OR RECOVERY ROOM!!**  IF YOU WILL BE ADMITTED INTO THE HOSPITAL YOU ARE ALLOWED ONLY FOUR SUPPORT PEOPLE DURING VISITATION HOURS ONLY (7 AM -8PM)   The support person(s) must pass our screening, gel in and out, and wear a mask at all times, including in the patient's room. Patients must also wear a mask when staff or their support person are in the room. Visitors GUEST BADGE MUST BE WORN VISIBLY  One adult visitor may remain with you overnight and MUST be in the room by 8 P.M.     Your procedure is scheduled on: 09/14/23   Report to [facility] Main Entrance    Report to admitting at 5:15 AM   Call this number if you have problems the morning of surgery [phone]   Do not eat food : or drink After Midnight.               If you have questions, please contact your surgeon's office.   FOLLOW BOWEL PREP AND ANY ADDITIONAL PRE OP INSTRUCTIONS YOU RECEIVED FROM YOUR SURGEON'S OFFICE!!!     Oral Hygiene is also important to reduce your risk of infection.                                    Remember - BRUSH YOUR TEETH THE MORNING OF SURGERY WITH YOUR REGULAR TOOTHPASTE  DENTURES WILL BE REMOVED PRIOR TO SURGERY PLEASE DO NOT APPLY "Poly grip" OR ADHESIVES!!!   Do NOT smoke after Midnight   Take these medicines the morning of surgery with A SIP OF WATER: Hydromorphone,Atorvastatin, mesalamine                                                                                                                            Buspirone, Atogepant, symproic                                                                                                                            Gabapentin, meclezine  Bupropion,duloxetine                                                                                                                             Midodrine,Sucralfate                                                                                                                             Omeprazole, Linzess                                                Bring your inhaler   Bring CPAP mask and tubing day of surgery.                              You may not have any metal on your body including hair pins, jewelry, and body piercing             Do not wear make-up, lotions, powders, perfumes, or deodorant  Do not wear nail polish including gel and S&S, artificial/acrylic nails, or any other type of covering on natural nails including finger and toenails. If you have artificial nails, gel coating, etc. that needs to be removed by a nail salon please have this removed prior to surgery or surgery may need to be canceled/ delayed if the surgeon/ anesthesia feels like they are unable to be safely monitored.   Do not shave  48 hours prior to surgery.     Do not bring valuables to the hospital. [facility] IS NOT             RESPONSIBLE   FOR VALUABLES.   Contacts, glasses, or bridgework may not be worn into surgery.   Bring small overnight bag day of surgery.   DO NOT BRING YOUR HOME MEDICATIONS TO THE HOSPITAL. PHARMACY WILL DISPENSE MEDICATIONS LISTED ON YOUR MEDICATION LIST TO YOU DURING YOUR ADMISSION IN THE HOSPITAL!    Patients discharged on the day of surgery will not be allowed to drive home.  Someone NEEDS to stay with you for the first 24 hours after anesthesia.   Special Instructions: Bring a copy of your healthcare power of attorney and living will documents  the day of surgery if you haven't scanned them before.  Please read over the following fact sheets you were given: IF YOU HAVE QUESTIONS ABOUT YOUR PRE-OP INSTRUCTIONS PLEASE CALL  [phone]    [facility] - Preparing for Surgery Before surgery, you can play an important role.  Because skin is not sterile, your skin needs to be as free of germs as possible.  You can reduce the number of germs on your skin by washing with CHG (chlorahexidine gluconate) soap before surgery.  CHG is an antiseptic cleaner which kills germs and bonds with the skin to continue killing germs even after washing. Please DO NOT use if you have an allergy to CHG or antibacterial soaps.  If your skin becomes reddened/irritated stop using the CHG and inform your nurse when you arrive at Short Stay. Do not shave (including legs and underarms) for at least 48 hours prior to the first CHG shower.   Please follow these instructions carefully:   1.  Shower with CHG Soap the night before surgery and the  morning of Surgery.  2.  If you choose to wash your hair, wash your hair first as usual with your  normal  shampoo.  3.  After you shampoo, rinse your hair and body thoroughly to remove the  shampoo.                            4.  Use CHG as you would any other liquid soap.  You can apply chg directly  to the skin and wash                       Gently with a scrungie or clean washcloth.  5.  Apply the CHG Soap to your body ONLY FROM THE NECK DOWN.   Do not use on face/ open                           Wound or open sores. Avoid contact with eyes, ears mouth and genitals (private parts).                       Wash face,  Genitals (private parts) with your normal soap.             6.  Wash thoroughly, paying special attention to the area where your surgery  will be performed.  7.  Thoroughly rinse your body with warm water from the neck down.  8.  DO NOT shower/wash with your normal soap after using and rinsing off  the CHG Soap.             9.  Pat yourself dry with a clean towel.            10.  Wear clean pajamas.            11.  Place clean sheets on your bed the night of your first shower and do not   sleep with pets. Day of Surgery : Do not apply any lotions/deodorants the morning of surgery.  Please wear clean clothes to the hospital/surgery center.   FAILURE TO FOLLOW THESE INSTRUCTIONS MAY RESULT IN THE CANCELLATION OF YOUR SURGERY  PATIENT SIGNATURE_________________________________   ________________________________________________________________________

## 2023-09-09 NOTE — Anesthesia Preprocedure Evaluation (Addendum)
Anesthesia Evaluation  Patient identified by MRN, date of birth, ID band Patient awake    Reviewed: Allergy & Precautions, NPO status , Patient's Chart, lab work & pertinent test results  History of Anesthesia Complications (+) PONV and history of anesthetic complications  Airway Mallampati: II  TM Distance: >3 FB Neck ROM: Full    Dental  (+) Edentulous Upper, Upper Dentures, Dental Advisory Given   Pulmonary asthma , former smoker   Pulmonary exam normal breath sounds clear to auscultation       Cardiovascular negative cardio ROS Normal cardiovascular exam Rhythm:Regular Rate:Normal     Neuro/Psych  Headaches PSYCHIATRIC DISORDERS Anxiety Depression       GI/Hepatic hiatal hernia, PUD,GERD  ,,(+)     substance abuse  UC   Endo/Other  negative endocrine ROS    Renal/GU negative Renal ROS  negative genitourinary   Musculoskeletal  (+) Arthritis ,  narcotic dependent  Abdominal   Peds  Hematology negative hematology ROS (+)   Anesthesia Other Findings   Reproductive/Obstetrics                             Anesthesia Physical Anesthesia Plan  ASA: 3  Anesthesia Plan: General   Post-op Pain Management:    Induction: Intravenous  PONV Risk Score and Plan: 4 or greater and Midazolam, Dexamethasone, Ondansetron, Scopolamine patch - Pre-op and TIVA  Airway Management Planned: Oral ETT  Additional Equipment:   Intra-op Plan:   Post-operative Plan: Extubation in OR  Informed Consent: I have reviewed the patients History and Physical, chart, labs and discussed the procedure including the risks, benefits and alternatives for the proposed anesthesia with the patient or authorized representative who has indicated his/her understanding and acceptance.     Dental advisory given  Plan Discussed with: CRNA  Anesthesia Plan Comments: (See PAT note from 12/11 by Sherlie Ban PA-C )         Anesthesia Quick Evaluation

## 2023-09-09 NOTE — Progress Notes (Signed)
DISCUSSION: [name] is a 47 yo female who presents to PAT prior to LAPAROSCOPIC REMNANT GASTROSTOMY TUBE PLACEMENT on 09/14/23 with Dr. Dossie Der. 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  Patient was admitted due from 10/27-11/7. She presented with hematemesis. Underwent EGD on 10/27 which was without complications. EGD showed non-healing marginal ulcer which was cauterized. It was recommended that she has outpatient discussions regarding her pouch and a possible GJ revision given she has had maximum medical therapy for treatment of PUD with persistent non-healing. She had significant issues with orthostatic hypotension and dizziness while inpatient which delayed her discharge home. She had ACTH stimulation test and cortisol level within normal limit.  She was discharged on maximum dose of midodrine.  Gastric tube placement was attempted with IR on 11/27 but they were not able to find a good position. Now scheduled for above procedure.  VS: BP (!) 133/94   Pulse 87   Temp 37.3 C   Resp 18   Ht 5\' 7"  (1.702 m)   Wt 56.7 kg   LMP 06/02/2017 Comment: hysterectomy 06/08/2017  SpO2 100%   BMI 19.58 kg/m   PROVIDERS: Felix Pacini, FNP   LABS: Labs reviewed: Acceptable for surgery. (all labs ordered are listed, but only abnormal results are displayed)  Labs Reviewed  CBC - Abnormal; Notable for the following components:      Result Value   RBC 3.38 (*)    Hemoglobin 10.4 (*)    HCT 32.6 (*)    Platelets 551 (*)    All other components within normal limits     IMAGES:   EKG:   CV:  Past Medical History:  Diagnosis Date   Anal fissure - posterior 10/16/2014    OCC  ISSUES   Anxiety    doesn't take anything   Asthma    has Albuterol inhaler as needed   Bronchitis    in winter   Chronic headache disorder 07/22/2016   Clostridium difficile infection  04/20/2012   Dehydration    Depression    Family history of adverse reaction to anesthesia    pt mom gets sick   GERD (gastroesophageal reflux disease)    takes Pantoprazole daily   Headache    Hiatal hernia    neuropathy - mild in arms and legs   History of blood transfusion    last transfusion was 04/04/2016=Benadryl was given d/t itching. States she always itches with transfusion.    History of migraine    last one 05/01/16   History of stomach ulcers    History of urinary tract infection LAST 2 WEEKS AGO   IDA (iron deficiency anemia)    Internal and external bleeding hemorrhoids 06/11/2014   Joint pain    Joint swelling    Left sided chronic colitis - segmental 06/11/2014   Marginal ulcer 10/25/2017   Motion sickness    Nausea    takes Zofran as needed   Nausea and vomiting    for 1 year   Oligouria    Osteoarthritis    lower back, knees, wrists - no meds   Peripheral neuropathy 03/01/2019   Personal history of gastric bypass    Postoperative nausea and vomiting 01/21/2016   wants scopolamine patch   Right carpal tunnel syndrome 03/01/2019   SVD (spontaneous vaginal delivery)    x 4   TOBACCO USER 10/02/2009   Qualifier: Diagnosis of  By: Carolyne Fiscal MD, Evan     Tremor 08/31/2018   UC (ulcerative colitis) (HCC)    supposed to be taking Lialda and Bentyl but has been off since gastric sleeve   Unsteady gait when walking 2024   uses a walker   Vertigo    doesn't take any meds    Past Surgical History:  Procedure Laterality Date   ABDOMINAL HYSTERECTOMY     PARTIAL   BIOPSY  01/20/2023   Procedure: BIOPSY;  Surgeon: Kerin Salen, MD;  Location: [facility];  Service: Gastroenterology;;   BLADDER STIMULATOR IMPLANT     COLONOSCOPY  2007   for rectal bleeding; [facility]   COLONOSCOPY N/A 06/11/2014   Procedure: COLONOSCOPY;  Surgeon: Iva Boop, MD;  Location: [facility];  Service: Endoscopy;  Laterality: N/A;   COLONOSCOPY WITH PROPOFOL N/A 03/02/2017    Procedure: COLONOSCOPY WITH PROPOFOL;  Surgeon: Ovidio Kin, MD;  Location: [facility];  Service: General;  Laterality: N/A;   DILATION AND CURETTAGE OF UTERUS     ESOPHAGOGASTRODUODENOSCOPY N/A 07/25/2023   Procedure: ESOPHAGOGASTRODUODENOSCOPY (EGD);  Surgeon: Lynann Bologna, DO;  Location: [facility];  Service: Gastroenterology;  Laterality: N/A;   ESOPHAGOGASTRODUODENOSCOPY (EGD) WITH PROPOFOL N/A 04/06/2016   Procedure: ESOPHAGOGASTRODUODENOSCOPY (EGD) WITH PROPOFOL;  Surgeon: Ovidio Kin, MD;  Location: [facility];  Service: General;  Laterality: N/A;   ESOPHAGOGASTRODUODENOSCOPY (EGD) WITH PROPOFOL N/A 03/02/2017   Procedure: ESOPHAGOGASTRODUODENOSCOPY (EGD) WITH PROPOFOL;  Surgeon: Ovidio Kin, MD;  Location: [facility];  Service: General;  Laterality: N/A;   ESOPHAGOGASTRODUODENOSCOPY (EGD) WITH PROPOFOL N/A 05/20/2017   Procedure: ESOPHAGOGASTRODUODENOSCOPY (EGD) WITH PROPOFOL ERAS PATHWAY;  Surgeon: Ovidio Kin, MD;  Location: [facility];  Service: General;  Laterality: N/A;   ESOPHAGOGASTRODUODENOSCOPY (EGD) WITH PROPOFOL N/A 10/07/2017   Procedure: ESOPHAGOGASTRODUODENOSCOPY (EGD) WITH PROPOFOL;  Surgeon: Ovidio Kin, MD;  Location: [facility];  Service: General;  Laterality: N/A;   ESOPHAGOGASTRODUODENOSCOPY (EGD) WITH PROPOFOL N/A 01/20/2023   Procedure: ESOPHAGOGASTRODUODENOSCOPY (EGD) WITH PROPOFOL;  Surgeon: Kerin Salen, MD;  Location: [facility];  Service: Gastroenterology;  Laterality: N/A;   EXCISION OF SKIN TAG  06/08/2017   Procedure: EXCISION OF VULVAR SKIN TAGS X2;  Surgeon: Reva Bores, MD;  Location: [facility];  Service: Gynecology;;   FOOT SURGERY Bilateral    x 2   GASTRIC ROUX-EN-Y N/A 12/14/2016   Procedure: LAPAROSCOPIC REVISION SLEEVE GASTRECTOMY TO  ROUX-Y-GASTRIC BY-PASS, UPPER ENDO;  Surgeon: Glenna Fellows, MD;  Location: [facility];  Service: General;  Laterality: N/A;   GASTROJEJUNOSTOMY N/A 05/11/2016   Procedure: LAPAROSCOPIC  PLACEMENT  OF FEEDING  JEJUNOSTOMY TUBE;  Surgeon: Glenna Fellows, MD;  Location: [facility];  Service: General;  Laterality: N/A;   HEMORRHOIDECTOMY WITH HEMORRHOID BANDING     HEMOSTASIS CLIP PLACEMENT  07/25/2023   Procedure: HEMOSTASIS CLIP PLACEMENT;  Surgeon: Lynann Bologna, DO;  Location: [facility];  Service: Gastroenterology;;   HOT HEMOSTASIS N/A 07/25/2023   Procedure: HOT HEMOSTASIS (ARGON PLASMA COAGULATION/BICAP);  Surgeon: Lynann Bologna, DO;  Location: [facility];  Service: Gastroenterology;  Laterality: N/A;   IR FLUORO GUIDE CV LINE RIGHT  04/06/2017   IR GASTROSTOMY TUBE MOD SED  08/25/2023   IR GENERIC HISTORICAL  05/19/2016   IR CM INJ ANY COLONIC TUBE W/FLUORO 05/19/2016 Irish Lack, MD [facility]   IR PATIENT EVAL TECH 0-60 MINS  11/30/2017   IR REPLC DUODEN/JEJUNO TUBE PERCUT W/FLUORO  03/14/2018   IR REPLC DUODEN/JEJUNO TUBE PERCUT W/FLUORO  03/15/2018   IR US  GUIDE VASC ACCESS LEFT  08/25/2023   IR US GUIDE VASC ACCESS LEFT  08/25/2023   IR US GUIDE VASC ACCESS RIGHT  04/06/2017   j tube removed march 2018     KNEE ARTHROSCOPY Left 09/06/2014   LAPAROSCOPIC GASTRIC SLEEVE RESECTION N/A 01/14/2016   Procedure: LAPAROSCOPIC GASTRIC SLEEVE RESECTION;  Surgeon: Glenna Fellows, MD;  Location: [facility];  Service: General;  Laterality: N/A;   LAPAROSCOPIC REVISION OF GASTROJEJUNOSTOMY N/A 10/25/2017   Procedure: LAPAROSCOPIC REVISION OF GASTROJEJUNOSTOMY AND PARTIAL GASTRECTOMY, WITH PLACEMENT OF FEEDING GASTROSTOMY TUBE;  Surgeon: Glenna Fellows, MD;  Location: [facility];  Service: General;  Laterality: N/A;   LAPAROSCOPIC TUBAL LIGATION  10/16/2011   Procedure: LAPAROSCOPIC TUBAL LIGATION;  Surgeon: Esmeralda Arthur, MD;  Location: [facility];  Service: Gynecology;  Laterality: Bilateral;   NOVASURE ABLATION  09/28/2010   mild persistent vaginal bleeding   right knee arthroscopy     05/07/2016   TOTAL KNEE ARTHROPLASTY Left 03/10/2018   Procedure: LEFT  TOTAL KNEE ARTHROPLASTY;  Surgeon: Durene Romans, MD;  Location: [facility];  Service: Orthopedics;  Laterality: Left;  70 mins   TOTAL KNEE ARTHROPLASTY Right 01/23/2020   Procedure: TOTAL KNEE ARTHROPLASTY, BILATERAL TROCHANTERIC INJECTION;  Surgeon: Durene Romans, MD;  Location: [facility];  Service: Orthopedics;  Laterality: Right;  70 mins for Bilateral Troch injection 1 cc Depo and 2 CC Lidocaine   VAGINAL HYSTERECTOMY Bilateral 06/08/2017   Procedure: HYSTERECTOMY VAGINAL W/ BILATERAL SALPINGECTOMY;  Surgeon: Reva Bores, MD;  Location: [facility];  Service: Gynecology;  Laterality: Bilateral;   wisdom teeth extracted      MEDICATIONS:  albuterol (VENTOLIN HFA) 108 (90 Base) MCG/ACT inhaler   Atogepant (QULIPTA) 30 MG TABS   atorvastatin (LIPITOR) 40 MG tablet   botulinum toxin Type A (BOTOX) 200 units injection   buPROPion (WELLBUTRIN XL) 300 MG 24 hr tablet   busPIRone (BUSPAR) 15 MG tablet   Cholecalciferol (VITAMIN D3) 1.25 MG (50000 UT) CAPS   clonazePAM (KLONOPIN) 1 MG tablet   cyanocobalamin (,VITAMIN B-12,) 1000 MCG/ML injection   diclofenac Sodium (VOLTAREN) 1 % GEL   dicyclomine (BENTYL) 10 MG capsule   DULoxetine (CYMBALTA) 60 MG capsule   EPINEPHrine 0.3 mg/0.3 mL IJ SOAJ injection   famotidine (PEPCID) 20 MG tablet   ferrous sulfate 325 (65 FE) MG tablet   fluticasone (FLONASE) 50 MCG/ACT nasal spray   gabapentin (NEURONTIN) 600 MG tablet   hydrocortisone (ANUSOL-HC) 2.5 % rectal cream   HYDROmorphone (DILAUDID) 8 MG tablet   linaclotide (LINZESS) 290 MCG CAPS capsule   magnesium oxide (MAG-OX) 400 (240 Mg) MG tablet   meclizine (ANTIVERT) 25 MG tablet   mesalamine (LIALDA) 1.2 g EC tablet   midodrine (PROAMATINE) 5 MG tablet   Multiple Vitamin (MULTIVITAMIN WITH MINERALS) TABS tablet   NARCAN 4 MG/0.1ML LIQD nasal spray kit   omeprazole (KONVOMEP) 2 mg/mL SUSP oral suspension   ondansetron (ZOFRAN) 8 MG tablet   oxybutynin (DITROPAN) 5 MG tablet   sucralfate  (CARAFATE) 1 g tablet   SUMAtriptan (IMITREX) 50 MG tablet   SYMPROIC 0.2 MG TABS   thiamine (VITAMIN B-1) 100 MG tablet   torsemide (DEMADEX) 5 MG tablet   valACYclovir (VALTREX) 1000 MG tablet   No current facility-administered medications for this encounter.    Marcille Blanco [facility]/Anesthesiology [facility] Phone [phone] 09/09/2023 3:03 PM

## 2023-09-14 ENCOUNTER — Other Ambulatory Visit: Payer: Self-pay

## 2023-09-14 ENCOUNTER — Ambulatory Visit (HOSPITAL_COMMUNITY): Payer: Medicare Other | Admitting: Medical

## 2023-09-14 ENCOUNTER — Inpatient Hospital Stay (HOSPITAL_COMMUNITY)
Admission: AD | Admit: 2023-09-14 | Discharge: 2023-09-16 | DRG: 394 | Disposition: A | Discharge status: Home Health | Payer: Medicare Other | Source: Ambulatory Visit | Attending: Surgery | Admitting: Surgery

## 2023-09-14 ENCOUNTER — Encounter (HOSPITAL_COMMUNITY)
Admission: AD | Disposition: A | Discharge status: Home Health | Payer: Self-pay | Source: Ambulatory Visit | Attending: Surgery

## 2023-09-14 ENCOUNTER — Ambulatory Visit (HOSPITAL_COMMUNITY): Payer: Medicare Other | Admitting: Anesthesiology

## 2023-09-14 ENCOUNTER — Encounter (HOSPITAL_COMMUNITY): Payer: Self-pay | Admitting: Surgery

## 2023-09-14 DIAGNOSIS — Z885 Allergy status to narcotic agent status: Secondary | ICD-10-CM

## 2023-09-14 DIAGNOSIS — Z832 Family history of diseases of the blood and blood-forming organs and certain disorders involving the immune mechanism: Secondary | ICD-10-CM

## 2023-09-14 DIAGNOSIS — G629 Polyneuropathy, unspecified: Secondary | ICD-10-CM | POA: Diagnosis present

## 2023-09-14 DIAGNOSIS — D509 Iron deficiency anemia, unspecified: Secondary | ICD-10-CM | POA: Diagnosis present

## 2023-09-14 DIAGNOSIS — E43 Unspecified severe protein-calorie malnutrition: Secondary | ICD-10-CM | POA: Diagnosis not present

## 2023-09-14 DIAGNOSIS — J45909 Unspecified asthma, uncomplicated: Secondary | ICD-10-CM | POA: Diagnosis present

## 2023-09-14 DIAGNOSIS — E440 Moderate protein-calorie malnutrition: Secondary | ICD-10-CM | POA: Diagnosis present

## 2023-09-14 DIAGNOSIS — K9589 Other complications of other bariatric procedure: Principal | ICD-10-CM | POA: Diagnosis present

## 2023-09-14 DIAGNOSIS — Z833 Family history of diabetes mellitus: Secondary | ICD-10-CM

## 2023-09-14 DIAGNOSIS — Z91048 Other nonmedicinal substance allergy status: Secondary | ICD-10-CM | POA: Diagnosis not present

## 2023-09-14 DIAGNOSIS — Z881 Allergy status to other antibiotic agents status: Secondary | ICD-10-CM | POA: Diagnosis not present

## 2023-09-14 DIAGNOSIS — K219 Gastro-esophageal reflux disease without esophagitis: Secondary | ICD-10-CM | POA: Diagnosis present

## 2023-09-14 DIAGNOSIS — R633 Feeding difficulties, unspecified: Secondary | ICD-10-CM | POA: Diagnosis present

## 2023-09-14 DIAGNOSIS — Z880 Allergy status to penicillin: Secondary | ICD-10-CM

## 2023-09-14 DIAGNOSIS — Z888 Allergy status to other drugs, medicaments and biological substances status: Secondary | ICD-10-CM | POA: Diagnosis not present

## 2023-09-14 DIAGNOSIS — Z800 Family history of malignant neoplasm of digestive organs: Secondary | ICD-10-CM

## 2023-09-14 DIAGNOSIS — R6339 Other feeding difficulties: Secondary | ICD-10-CM | POA: Diagnosis present

## 2023-09-14 DIAGNOSIS — Z9884 Bariatric surgery status: Secondary | ICD-10-CM | POA: Diagnosis not present

## 2023-09-14 DIAGNOSIS — K289 Gastrojejunal ulcer, unspecified as acute or chronic, without hemorrhage or perforation: Secondary | ICD-10-CM | POA: Diagnosis present

## 2023-09-14 DIAGNOSIS — F32A Depression, unspecified: Secondary | ICD-10-CM | POA: Diagnosis present

## 2023-09-14 DIAGNOSIS — Z87891 Personal history of nicotine dependence: Secondary | ICD-10-CM | POA: Diagnosis not present

## 2023-09-14 DIAGNOSIS — Z803 Family history of malignant neoplasm of breast: Secondary | ICD-10-CM

## 2023-09-14 DIAGNOSIS — R627 Adult failure to thrive: Secondary | ICD-10-CM | POA: Diagnosis present

## 2023-09-14 DIAGNOSIS — Z8349 Family history of other endocrine, nutritional and metabolic diseases: Secondary | ICD-10-CM

## 2023-09-14 DIAGNOSIS — F419 Anxiety disorder, unspecified: Secondary | ICD-10-CM | POA: Diagnosis present

## 2023-09-14 DIAGNOSIS — Z79899 Other long term (current) drug therapy: Secondary | ICD-10-CM

## 2023-09-14 DIAGNOSIS — Z96653 Presence of artificial knee joint, bilateral: Secondary | ICD-10-CM | POA: Diagnosis present

## 2023-09-14 DIAGNOSIS — Z681 Body mass index (BMI) 19 or less, adult: Secondary | ICD-10-CM | POA: Diagnosis not present

## 2023-09-14 DIAGNOSIS — Z91018 Allergy to other foods: Secondary | ICD-10-CM | POA: Diagnosis not present

## 2023-09-14 DIAGNOSIS — Z825 Family history of asthma and other chronic lower respiratory diseases: Secondary | ICD-10-CM

## 2023-09-14 DIAGNOSIS — Z9102 Food additives allergy status: Secondary | ICD-10-CM | POA: Diagnosis not present

## 2023-09-14 DIAGNOSIS — Z801 Family history of malignant neoplasm of trachea, bronchus and lung: Secondary | ICD-10-CM

## 2023-09-14 DIAGNOSIS — Z8249 Family history of ischemic heart disease and other diseases of the circulatory system: Secondary | ICD-10-CM

## 2023-09-14 DIAGNOSIS — Z808 Family history of malignant neoplasm of other organs or systems: Secondary | ICD-10-CM

## 2023-09-14 HISTORY — PX: LAPAROSCOPIC INSERTION GASTROSTOMY TUBE: SHX6817

## 2023-09-14 LAB — GLUCOSE, CAPILLARY
Glucose-Capillary: 120 mg/dL — ABNORMAL HIGH (ref 70–99)
Glucose-Capillary: 95 mg/dL (ref 70–99)
Glucose-Capillary: 138 mg/dL — ABNORMAL HIGH (ref 70–99)

## 2023-09-14 LAB — CBC
HCT: 28.3 % — ABNORMAL LOW (ref 36.0–46.0)
Hemoglobin: 9 g/dL — ABNORMAL LOW (ref 12.0–15.0)
MCH: 30.1 pg (ref 26.0–34.0)
MCHC: 31.8 g/dL (ref 30.0–36.0)
MCV: 94.6 fL (ref 80.0–100.0)
Platelets: 330 10*3/uL (ref 150–400)
RBC: 2.99 MIL/uL — ABNORMAL LOW (ref 3.87–5.11)
RDW: 13.4 % (ref 11.5–15.5)
WBC: 7.9 10*3/uL (ref 4.0–10.5)
nRBC: 0 % (ref 0.0–0.2)

## 2023-09-14 LAB — CREATININE, SERUM
Creatinine, Ser: 0.72 mg/dL (ref 0.44–1.00)
GFR, Estimated: >60 mL/min (ref >60)

## 2023-09-14 SURGERY — INSERTION, GASTROSTOMY TUBE, PERCUTANEOUS
Anesthesia: General

## 2023-09-14 MED ORDER — PHENYLEPHRINE 80 MCG/ML (10ML) SYRINGE FOR IV PUSH (FOR BLOOD PRESSURE SUPPORT)
PREFILLED_SYRINGE | INTRAVENOUS | Status: DC | PRN
  Administered 2023-09-14: 160 ug via INTRAVENOUS

## 2023-09-14 MED ORDER — FENTANYL CITRATE PF 50 MCG/ML IJ SOSY
25.0000 ug | PREFILLED_SYRINGE | INTRAMUSCULAR | Status: DC | PRN
  Administered 2023-09-14 (×2): 50 ug via INTRAVENOUS

## 2023-09-14 MED ORDER — ORAL CARE MOUTH RINSE: 15.0000 mL | Freq: Once | OROMUCOSAL | Status: AC

## 2023-09-14 MED ORDER — FENTANYL CITRATE (PF) 100 MCG/2ML IJ SOLN
INTRAMUSCULAR | Status: AC
  Filled 2023-09-14: qty 2

## 2023-09-14 MED ORDER — EPINEPHRINE 0.3 MG/0.3ML IJ SOAJ: 0.3000 mg | INTRAMUSCULAR | Status: DC | PRN

## 2023-09-14 MED ORDER — CEFAZOLIN SODIUM-DEXTROSE 2-4 GM/100ML-% IV SOLN
2.0000 g | INTRAVENOUS | Status: AC
  Administered 2023-09-14: 2 g via INTRAVENOUS
  Filled 2023-09-14: qty 100

## 2023-09-14 MED ORDER — SUGAMMADEX SODIUM 200 MG/2ML IV SOLN
INTRAVENOUS | Status: DC | PRN
  Administered 2023-09-14: 200 mg via INTRAVENOUS

## 2023-09-14 MED ORDER — HEPARIN SODIUM (PORCINE) 5000 UNIT/ML IJ SOLN
5000.0000 [IU] | Freq: Three times a day (TID) | INTRAMUSCULAR | Status: DC
  Administered 2023-09-14 – 2023-09-16 (×7): 5000 [IU] via SUBCUTANEOUS
  Filled 2023-09-14 (×7): qty 1

## 2023-09-14 MED ORDER — ATORVASTATIN CALCIUM 20 MG PO TABS
40.0000 mg | ORAL_TABLET | Freq: Every day | ORAL | Status: DC
  Administered 2023-09-14 – 2023-09-16 (×3): 40 mg via ORAL
  Filled 2023-09-14 (×3): qty 2

## 2023-09-14 MED ORDER — BUPIVACAINE-EPINEPHRINE 0.25% -1:200000 IJ SOLN
INTRAMUSCULAR | Status: AC
  Filled 2023-09-14: qty 1

## 2023-09-14 MED ORDER — ONDANSETRON HCL 4 MG/2ML IJ SOLN
INTRAMUSCULAR | Status: DC | PRN
  Administered 2023-09-14: 4 mg via INTRAVENOUS

## 2023-09-14 MED ORDER — PROCHLORPERAZINE MALEATE 10 MG PO TABS: 10.0000 mg | ORAL_TABLET | Freq: Four times a day (QID) | ORAL | Status: DC | PRN

## 2023-09-14 MED ORDER — CHLORHEXIDINE GLUCONATE CLOTH 2 % EX PADS: 6.0000 | MEDICATED_PAD | Freq: Once | CUTANEOUS | Status: DC

## 2023-09-14 MED ORDER — CHLORHEXIDINE GLUCONATE CLOTH 2 % EX PADS
6.0000 | MEDICATED_PAD | Freq: Once | CUTANEOUS | Status: DC
Start: 2023-09-14 — End: 2023-09-14

## 2023-09-14 MED ORDER — MECLIZINE HCL 25 MG PO TABS
25.0000 mg | ORAL_TABLET | Freq: Three times a day (TID) | ORAL | Status: DC
  Administered 2023-09-14 – 2023-09-16 (×6): 25 mg via ORAL
  Filled 2023-09-14 (×6): qty 1

## 2023-09-14 MED ORDER — LIDOCAINE HCL (CARDIAC) PF 100 MG/5ML IV SOSY
PREFILLED_SYRINGE | INTRAVENOUS | Status: DC | PRN
  Administered 2023-09-14: 40 mg via INTRAVENOUS

## 2023-09-14 MED ORDER — ALBUTEROL SULFATE (2.5 MG/3ML) 0.083% IN NEBU: 2.5000 mg | INHALATION_SOLUTION | Freq: Four times a day (QID) | RESPIRATORY_TRACT | Status: DC | PRN

## 2023-09-14 MED ORDER — KETAMINE HCL 50 MG/5ML IJ SOSY
PREFILLED_SYRINGE | INTRAMUSCULAR | Status: AC
  Filled 2023-09-14: qty 5

## 2023-09-14 MED ORDER — GABAPENTIN 300 MG PO CAPS
300.0000 mg | ORAL_CAPSULE | ORAL | Status: AC
  Administered 2023-09-14: 300 mg via ORAL
  Filled 2023-09-14: qty 1

## 2023-09-14 MED ORDER — CARMEX CLASSIC LIP BALM EX OINT
TOPICAL_OINTMENT | CUTANEOUS | Status: AC
  Filled 2023-09-14: qty 10

## 2023-09-14 MED ORDER — STERILE WATER FOR IRRIGATION IR SOLN
Status: DC | PRN
  Administered 2023-09-14: 1000 mL

## 2023-09-14 MED ORDER — LIDOCAINE HCL (PF) 2 % IJ SOLN
INTRAMUSCULAR | Status: AC
  Filled 2023-09-14: qty 5

## 2023-09-14 MED ORDER — NALOXONE HCL 4 MG/0.1ML NA LIQD: 1.0000 | Freq: Every day | NASAL | Status: DC | PRN

## 2023-09-14 MED ORDER — SIMETHICONE 80 MG PO CHEW
40.0000 mg | CHEWABLE_TABLET | Freq: Four times a day (QID) | ORAL | Status: DC | PRN
Start: 2023-09-14 — End: 2023-09-16
  Administered 2023-09-14 – 2023-09-15 (×2): 40 mg via ORAL
  Filled 2023-09-14 (×2): qty 1

## 2023-09-14 MED ORDER — HYDROMORPHONE HCL 2 MG PO TABS
ORAL_TABLET | ORAL | Status: AC
  Filled 2023-09-14: qty 2

## 2023-09-14 MED ORDER — HYDROMORPHONE HCL 2 MG PO TABS
4.0000 mg | ORAL_TABLET | ORAL | Status: DC
  Administered 2023-09-14: 4 mg via ORAL

## 2023-09-14 MED ORDER — BUPIVACAINE LIPOSOME 1.3 % IJ SUSP: 20.0000 mL | Freq: Once | INTRAMUSCULAR | Status: DC

## 2023-09-14 MED ORDER — ATOGEPANT 30 MG PO TABS: 30.0000 mg | ORAL_TABLET | Freq: Every day | ORAL | Status: DC

## 2023-09-14 MED ORDER — OSMOLITE 1.2 CAL PO LIQD
1000.0000 mL | ORAL | Status: DC
  Administered 2023-09-14: 1000 mL
  Filled 2023-09-14 (×2): qty 1000

## 2023-09-14 MED ORDER — NALDEMEDINE TOSYLATE 0.2 MG PO TABS: 0.2000 mg | ORAL_TABLET | Freq: Every day | ORAL | Status: DC

## 2023-09-14 MED ORDER — BUSPIRONE HCL 5 MG PO TABS
15.0000 mg | ORAL_TABLET | Freq: Three times a day (TID) | ORAL | Status: DC
  Administered 2023-09-14 – 2023-09-16 (×6): 15 mg via ORAL
  Filled 2023-09-14 (×6): qty 3

## 2023-09-14 MED ORDER — HYDROCORTISONE (PERIANAL) 2.5 % EX CREA: 1.0000 | TOPICAL_CREAM | Freq: Every day | CUTANEOUS | Status: DC | PRN

## 2023-09-14 MED ORDER — SUCRALFATE 1 G PO TABS
1.0000 g | ORAL_TABLET | Freq: Four times a day (QID) | ORAL | Status: DC
  Administered 2023-09-14 – 2023-09-16 (×8): 1 g via ORAL
  Filled 2023-09-14 (×8): qty 1

## 2023-09-14 MED ORDER — HEPARIN SODIUM (PORCINE) 5000 UNIT/ML IJ SOLN
5000.0000 [IU] | Freq: Once | INTRAMUSCULAR | Status: AC
  Administered 2023-09-14: 5000 [IU] via SUBCUTANEOUS
  Filled 2023-09-14: qty 1

## 2023-09-14 MED ORDER — PANTOPRAZOLE SODIUM 40 MG PO TBEC
40.0000 mg | DELAYED_RELEASE_TABLET | Freq: Two times a day (BID) | ORAL | Status: DC
  Administered 2023-09-14: 40 mg via ORAL
  Filled 2023-09-14: qty 1

## 2023-09-14 MED ORDER — GABAPENTIN 300 MG PO CAPS
ORAL_CAPSULE | ORAL | Status: AC
  Filled 2023-09-14: qty 2

## 2023-09-14 MED ORDER — DEXAMETHASONE SODIUM PHOSPHATE 10 MG/ML IJ SOLN
INTRAMUSCULAR | Status: DC | PRN
  Administered 2023-09-14: 5 mg via INTRAVENOUS

## 2023-09-14 MED ORDER — OMEPRAZOLE 2 MG/ML ORAL SUSPENSION: 20.0000 mg | Freq: Two times a day (BID) | ORAL | Status: DC

## 2023-09-14 MED ORDER — DULOXETINE HCL 60 MG PO CPEP
60.0000 mg | ORAL_CAPSULE | Freq: Two times a day (BID) | ORAL | Status: DC
  Administered 2023-09-14 – 2023-09-16 (×5): 60 mg via ORAL
  Filled 2023-09-14 (×6): qty 1

## 2023-09-14 MED ORDER — LACTATED RINGERS IV SOLN: INTRAVENOUS | Status: AC

## 2023-09-14 MED ORDER — VALACYCLOVIR HCL 500 MG PO TABS
1000.0000 mg | ORAL_TABLET | Freq: Two times a day (BID) | ORAL | Status: DC | PRN
Start: 2023-09-14 — End: 2023-09-16

## 2023-09-14 MED ORDER — THIAMINE MONONITRATE 100 MG PO TABS
100.0000 mg | ORAL_TABLET | Freq: Every day | ORAL | Status: DC
  Administered 2023-09-15 – 2023-09-16 (×2): 100 mg via ORAL
  Filled 2023-09-14 (×2): qty 1

## 2023-09-14 MED ORDER — FENTANYL CITRATE PF 50 MCG/ML IJ SOSY
PREFILLED_SYRINGE | INTRAMUSCULAR | Status: AC
  Filled 2023-09-14: qty 1

## 2023-09-14 MED ORDER — MIDODRINE HCL 5 MG PO TABS
15.0000 mg | ORAL_TABLET | Freq: Three times a day (TID) | ORAL | Status: DC
  Administered 2023-09-14 – 2023-09-16 (×6): 15 mg via ORAL
  Filled 2023-09-14 (×6): qty 3

## 2023-09-14 MED ORDER — ONDANSETRON HCL 4 MG/2ML IJ SOLN
4.0000 mg | Freq: Four times a day (QID) | INTRAMUSCULAR | Status: DC | PRN
  Administered 2023-09-14 – 2023-09-16 (×3): 4 mg via INTRAVENOUS
  Filled 2023-09-14 (×4): qty 2

## 2023-09-14 MED ORDER — ENSURE MAX PROTEIN PO LIQD
11.0000 [oz_av] | Freq: Every day | ORAL | Status: DC
  Administered 2023-09-14 – 2023-09-16 (×3): 11 [oz_av] via ORAL

## 2023-09-14 MED ORDER — BUPROPION HCL ER (XL) 150 MG PO TB24
450.0000 mg | ORAL_TABLET | Freq: Every morning | ORAL | Status: DC
  Administered 2023-09-15 – 2023-09-16 (×2): 450 mg via ORAL
  Filled 2023-09-14 (×2): qty 3

## 2023-09-14 MED ORDER — BUPIVACAINE-EPINEPHRINE 0.25% -1:200000 IJ SOLN
INTRAMUSCULAR | Status: DC | PRN
  Administered 2023-09-14: 30 mL

## 2023-09-14 MED ORDER — DICYCLOMINE HCL 10 MG PO CAPS
10.0000 mg | ORAL_CAPSULE | Freq: Four times a day (QID) | ORAL | Status: DC
  Administered 2023-09-14 – 2023-09-16 (×9): 10 mg via ORAL
  Filled 2023-09-14 (×10): qty 1

## 2023-09-14 MED ORDER — SUMATRIPTAN SUCCINATE 50 MG PO TABS: 50.0000 mg | ORAL_TABLET | ORAL | Status: DC | PRN

## 2023-09-14 MED ORDER — LINACLOTIDE 145 MCG PO CAPS
290.0000 ug | ORAL_CAPSULE | Freq: Every day | ORAL | Status: DC
  Administered 2023-09-15 – 2023-09-16 (×2): 290 ug via ORAL
  Filled 2023-09-14 (×2): qty 2

## 2023-09-14 MED ORDER — MAGNESIUM OXIDE -MG SUPPLEMENT 400 (240 MG) MG PO TABS
400.0000 mg | ORAL_TABLET | Freq: Every day | ORAL | Status: DC
Start: 2023-09-14 — End: 2023-09-16
  Administered 2023-09-14 – 2023-09-16 (×3): 400 mg via ORAL
  Filled 2023-09-14 (×3): qty 1

## 2023-09-14 MED ORDER — SCOPOLAMINE 1 MG/3DAYS TD PT72
1.0000 | MEDICATED_PATCH | Freq: Once | TRANSDERMAL | Status: DC
  Administered 2023-09-14: 1.5 mg via TRANSDERMAL
  Filled 2023-09-14: qty 1

## 2023-09-14 MED ORDER — CHLORHEXIDINE GLUCONATE 0.12 % MT SOLN
15.0000 mL | Freq: Once | OROMUCOSAL | Status: AC
Start: 2023-09-14 — End: 2023-09-14
  Administered 2023-09-14: 15 mL via OROMUCOSAL

## 2023-09-14 MED ORDER — FLUTICASONE PROPIONATE 50 MCG/ACT NA SUSP: 2.0000 | Freq: Every day | NASAL | Status: DC | PRN

## 2023-09-14 MED ORDER — HYDROMORPHONE HCL 2 MG PO TABS
2.0000 mg | ORAL_TABLET | ORAL | Status: DC | PRN
  Administered 2023-09-14: 2 mg via ORAL
  Administered 2023-09-14 – 2023-09-16 (×5): 4 mg via ORAL
  Administered 2023-09-16: 2 mg via ORAL
  Filled 2023-09-14: qty 2
  Filled 2023-09-14: qty 1
  Filled 2023-09-14: qty 2
  Filled 2023-09-14 (×3): qty 1
  Filled 2023-09-14 (×3): qty 2
  Filled 2023-09-14: qty 1

## 2023-09-14 MED ORDER — 0.9 % SODIUM CHLORIDE (POUR BTL) OPTIME
TOPICAL | Status: DC | PRN
  Administered 2023-09-14: 1000 mL

## 2023-09-14 MED ORDER — MESALAMINE 1.2 G PO TBEC
2.4000 g | DELAYED_RELEASE_TABLET | Freq: Every day | ORAL | Status: DC
  Administered 2023-09-15 – 2023-09-16 (×2): 2.4 g via ORAL
  Filled 2023-09-14 (×2): qty 2

## 2023-09-14 MED ORDER — MIDAZOLAM HCL 5 MG/5ML IJ SOLN
INTRAMUSCULAR | Status: DC | PRN
  Administered 2023-09-14: 2 mg via INTRAVENOUS

## 2023-09-14 MED ORDER — ROCURONIUM BROMIDE 100 MG/10ML IV SOLN
INTRAVENOUS | Status: DC | PRN
  Administered 2023-09-14: 30 mg via INTRAVENOUS

## 2023-09-14 MED ORDER — PROPOFOL 10 MG/ML IV BOLUS
INTRAVENOUS | Status: AC
  Filled 2023-09-14: qty 20

## 2023-09-14 MED ORDER — GABAPENTIN 300 MG PO CAPS
600.0000 mg | ORAL_CAPSULE | Freq: Three times a day (TID) | ORAL | Status: DC
  Administered 2023-09-14 – 2023-09-16 (×6): 600 mg via ORAL
  Filled 2023-09-14 (×5): qty 2
  Filled 2023-09-14: qty 6

## 2023-09-14 MED ORDER — PROPOFOL 10 MG/ML IV BOLUS
INTRAVENOUS | Status: DC | PRN
  Administered 2023-09-14: 200 ug/kg/min via INTRAVENOUS
  Administered 2023-09-14: 100 mg via INTRAVENOUS

## 2023-09-14 MED ORDER — PROCHLORPERAZINE EDISYLATE 10 MG/2ML IJ SOLN: 5.0000 mg | Freq: Four times a day (QID) | INTRAMUSCULAR | Status: DC | PRN

## 2023-09-14 MED ORDER — ONDANSETRON HCL 4 MG/2ML IJ SOLN
INTRAMUSCULAR | Status: AC
  Filled 2023-09-14: qty 2

## 2023-09-14 MED ORDER — ONDANSETRON 4 MG PO TBDP
ORAL_TABLET | ORAL | Status: AC
  Filled 2023-09-14: qty 1

## 2023-09-14 MED ORDER — FENTANYL CITRATE (PF) 100 MCG/2ML IJ SOLN
INTRAMUSCULAR | Status: DC | PRN
  Administered 2023-09-14 (×2): 50 ug via INTRAVENOUS

## 2023-09-14 MED ORDER — NALOXONE HCL 0.4 MG/ML IJ SOLN: 0.4000 mg | INTRAMUSCULAR | Status: DC | PRN

## 2023-09-14 MED ORDER — LACTATED RINGERS IV SOLN: INTRAVENOUS | Status: DC

## 2023-09-14 MED ORDER — OXYBUTYNIN CHLORIDE 5 MG PO TABS
10.0000 mg | ORAL_TABLET | Freq: Every day | ORAL | Status: DC
  Administered 2023-09-14 – 2023-09-15 (×2): 10 mg via ORAL
  Filled 2023-09-14 (×2): qty 2

## 2023-09-14 MED ORDER — ROCURONIUM BROMIDE 10 MG/ML (PF) SYRINGE
PREFILLED_SYRINGE | INTRAVENOUS | Status: AC
  Filled 2023-09-14: qty 10

## 2023-09-14 MED ORDER — FAMOTIDINE 20 MG PO TABS
20.0000 mg | ORAL_TABLET | Freq: Two times a day (BID) | ORAL | Status: DC
  Administered 2023-09-14 – 2023-09-16 (×4): 20 mg via ORAL
  Filled 2023-09-14 (×4): qty 1

## 2023-09-14 MED ORDER — CLONAZEPAM 1 MG PO TABS
1.0000 mg | ORAL_TABLET | Freq: Two times a day (BID) | ORAL | Status: DC | PRN
Start: 2023-09-14 — End: 2023-09-16
  Administered 2023-09-14 – 2023-09-15 (×2): 1 mg via ORAL
  Filled 2023-09-14 (×2): qty 1

## 2023-09-14 MED ORDER — ONDANSETRON 4 MG PO TBDP
4.0000 mg | ORAL_TABLET | Freq: Four times a day (QID) | ORAL | Status: DC | PRN
  Administered 2023-09-14: 4 mg via ORAL
  Filled 2023-09-14: qty 1

## 2023-09-14 MED ORDER — MIDAZOLAM HCL 2 MG/2ML IJ SOLN
INTRAMUSCULAR | Status: AC
  Filled 2023-09-14: qty 2

## 2023-09-14 SURGICAL SUPPLY — 39 items
BAG COUNTER SPONGE SURGICOUNT (BAG) IMPLANT
BENZOIN TINCTURE PRP APPL 2/3 (GAUZE/BANDAGES/DRESSINGS) IMPLANT
BNDG ADH 1X3 SHEER STRL LF (GAUZE/BANDAGES/DRESSINGS) ×2 IMPLANT
CABLE HIGH FREQUENCY MONO STRZ (ELECTRODE) IMPLANT
CHLORAPREP W/TINT 26 (MISCELLANEOUS) ×1 IMPLANT
COVER SURGICAL LIGHT HANDLE (MISCELLANEOUS) ×1 IMPLANT
DERMABOND ADVANCED .7 DNX12 (GAUZE/BANDAGES/DRESSINGS) IMPLANT
DRSG TEGADERM 4X4.75 (GAUZE/BANDAGES/DRESSINGS) IMPLANT
G-TUBE MIC BOLUS 20FR ENFIT (TUBING) IMPLANT
G-TUBE MIC BOLUS 22FR ENFIT (TUBING) IMPLANT
G-TUBE MIC BOLUS 24FR ENFIT (CATHETERS) IMPLANT
GLOVE BIOGEL PI IND STRL 7.0 (GLOVE) ×1 IMPLANT
GLOVE SURG SS PI 7.0 STRL IVOR (GLOVE) ×1 IMPLANT
GOWN STRL REUS W/ TWL LRG LVL3 (GOWN DISPOSABLE) ×1 IMPLANT
GOWN STRL REUS W/ TWL XL LVL3 (GOWN DISPOSABLE) IMPLANT
IRRIG SUCT STRYKERFLOW 2 WTIP (MISCELLANEOUS) IMPLANT
IRRIGATION SUCT STRKRFLW 2 WTP (MISCELLANEOUS) IMPLANT
KIT BASIN OR (CUSTOM PROCEDURE TRAY) ×1 IMPLANT
KIT TURNOVER KIT A (KITS) IMPLANT
LUBRICANT JELLY K Y 4OZ (MISCELLANEOUS) IMPLANT
PENCIL SMOKE EVACUATOR (MISCELLANEOUS) IMPLANT
SCISSORS LAP 5X35 DISP (ENDOMECHANICALS) IMPLANT
SET IRRIG Y TYPE TUR BLADDER L (SET/KITS/TRAYS/PACK) IMPLANT
SET TUBE SMOKE EVAC HIGH FLOW (TUBING) ×1 IMPLANT
SLEEVE Z-THREAD 5X100MM (TROCAR) ×1 IMPLANT
SPIKE FLUID TRANSFER (MISCELLANEOUS) ×1 IMPLANT
SPONGE DRAIN TRACH 4X4 STRL 2S (GAUZE/BANDAGES/DRESSINGS) IMPLANT
STRIP CLOSURE SKIN 1/2X4 (GAUZE/BANDAGES/DRESSINGS) IMPLANT
SUT ETHILON 2 0 PS N (SUTURE) IMPLANT
SUT MNCRL AB 4-0 PS2 18 (SUTURE) ×1 IMPLANT
SUT SILK 2 0 SH (SUTURE) ×3 IMPLANT
TOWEL OR 17X26 10 PK STRL BLUE (TOWEL DISPOSABLE) ×1 IMPLANT
TOWEL OR NON WOVEN STRL DISP B (DISPOSABLE) IMPLANT
TRAY LAPAROSCOPIC (CUSTOM PROCEDURE TRAY) ×1 IMPLANT
TROCAR Z-THREAD OPTICAL 5X100M (TROCAR) ×1 IMPLANT
TUBE GASTRO BOLUS 22FR ENFIT (TUBING) IMPLANT
TUBE GASTRO BOLUS 24FR ENFIT (CATHETERS) IMPLANT
TUBE GASTRO BOLUS ENFIT (TUBING) ×1
TUBE GASTRO BOLUS ENFIT 20 FR (TUBING) ×1 IMPLANT

## 2023-09-14 NOTE — Anesthesia Postprocedure Evaluation (Signed)
Anesthesia Post Note  Patient: [name]  Procedure(s) Performed: LAPAROSCOPIC REMNANT GASTROSTOMY TUBE PLACEMENT     Patient location during evaluation: PACU Anesthesia Type: General Level of consciousness: awake and alert Pain management: pain level controlled Vital Signs Assessment: post-procedure vital signs reviewed and stable Respiratory status: spontaneous breathing, nonlabored ventilation, respiratory function stable and patient connected to nasal cannula oxygen Cardiovascular status: blood pressure returned to baseline and stable Postop Assessment: no apparent nausea or vomiting Anesthetic complications: no  No notable events documented.  Last Vitals:  Vitals:   09/14/23 1030 09/14/23 1130  BP: 115/81 (!) 114/56  Pulse: 77 85  Resp: 13 13  Temp:    SpO2: 100% 100%    Last Pain:  Vitals:   09/14/23 1145  TempSrc:   PainSc: 9                  Charlaine Utsey L Cote Mayabb

## 2023-09-14 NOTE — Transfer of Care (Signed)
Immediate Anesthesia Transfer of Care Note  Patient: DENASIA WHISLER  Procedure(s) Performed: LAPAROSCOPIC REMNANT GASTROSTOMY TUBE PLACEMENT  Patient Location: PACU  Anesthesia Type:General  Level of Consciousness: drowsy  Airway & Oxygen Therapy: Patient Spontanous Breathing and Patient connected to face mask  Post-op Assessment: Report given to RN  Post vital signs: Reviewed and stable  Last Vitals:  Vitals Value Taken Time  BP 106/60 09/14/23 0828  Temp    Pulse 77 09/14/23 0830  Resp 16 09/14/23 0830  SpO2 100 % 09/14/23 0830  Vitals shown include unfiled device data.  Last Pain:  Vitals:   09/14/23 0625  TempSrc: Oral  PainSc:          Complications: No notable events documented.

## 2023-09-14 NOTE — H&P (Signed)
Admitting Physician: Hyman Hopes Ciela Mahajan  Service: Bariatric surgery  CC: Feeding intolerance  Subjective   HPI: [name] has a large marginal ulcer resistant to over a year of medical therapy. Recently, she had significant bleeding from this ulcer which took her into the hospital where she required emergent EGD. She does not smoke. She does not use NSAIDs. She is malnourished. 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 She relies mostly on protein shakes.  Bariatric surgical history: Laparoscopic sleeve gastrectomy 01/14/2016 - Dr. Johna Sheriff Laparoscopic jejunostomy tube placement 05/11/2016 - Dr. Johna Sheriff for postoperative nausea and vomiting status post sleeve gastrectomy Laparoscopic conversion of sleeve gastrectomy to roux-en-y gastric bypass with takedown of feeding jejunostomy 12/14/2016 - Dr. Johna Sheriff for nausea and vomiting after sleeve gastrectomy EGD on 03/02/2017 - Dr. Ezzard Standing noted a two superficial marginal ulcers at the gastrojejunostomy EGD on 05/20/2017 - Dr. Ezzard Standing noted persistent superficial marginal ulcers EGD on 10/07/2017 - Dr. Ezzard Standing noted persistent marginal ulcer Revision and resection of her gastrojejunostomy with gastrostomy tube placement 10/25/2017 - Dr. Johna Sheriff Approximately 1/2 of her pouch was resected, and a centimeter or two of her roux limb was resected. The GJ was performed in the typical fashion with a posterior layer of vicry, a 45mm blue load to create the anastomosis, 2-0 vicryl closure of the enterotomy and 2-0 vicryl anterior outer layer. During the first attempt at firing the stapler, the anvil was located behind the roux limb and not in the lumen, so everything was taken down and redone. A g tube was placed in the remnant stomach (previously distal sleeve) medial to the roux limb. EGD on 01/20/23 - Dr. Marca Ancona noted non bleeding gastric and jejunal ulcers EGD on 07/25/23 - Dr. Lorenso Quarry  noted oozing marginal ulcers treated with bipolar cautery    Past Medical History:  Diagnosis Date   Anal fissure - posterior 10/16/2014    OCC  ISSUES   Anxiety    doesn't take anything   Asthma    has Albuterol inhaler as needed   Bronchitis    in winter   Chronic headache disorder 07/22/2016   Clostridium difficile infection 04/20/2012   Dehydration    Depression    Family history of adverse reaction to anesthesia    pt mom gets sick   GERD (gastroesophageal reflux disease)    takes Pantoprazole daily   Headache    Hiatal hernia    neuropathy - mild in arms and legs   History of blood transfusion    last transfusion was 04/04/2016=Benadryl was given d/t itching. States she always itches with transfusion.    History of migraine    last one 05/01/16   History of stomach ulcers    History of urinary tract infection LAST 2 WEEKS AGO   IDA (iron deficiency anemia)    Internal and external bleeding hemorrhoids 06/11/2014   Joint pain    Joint swelling    Left sided chronic colitis - segmental 06/11/2014   Marginal ulcer 10/25/2017   Motion sickness    Nausea    takes Zofran as needed   Nausea and vomiting    for 1 year   Oligouria    Osteoarthritis    lower back, knees, wrists - no meds   Peripheral neuropathy 03/01/2019   Personal history of gastric bypass    Postoperative nausea and vomiting 01/21/2016   wants scopolamine patch   Right carpal tunnel syndrome 03/01/2019  SVD (spontaneous vaginal delivery)    x 4   TOBACCO USER 10/02/2009   Qualifier: Diagnosis of  By: Carolyne Fiscal MD, Clayburn Pert     Tremor 08/31/2018   UC (ulcerative colitis) (HCC)    supposed to be taking Lialda and Bentyl but has been off since gastric sleeve   Unsteady gait when walking 2024   uses a walker   Vertigo    doesn't take any meds    Past Surgical History:  Procedure Laterality Date   ABDOMINAL HYSTERECTOMY     PARTIAL   BIOPSY  01/20/2023   Procedure: BIOPSY;  Surgeon: Kerin Salen, MD;   Location: [facility];  Service: Gastroenterology;;   BLADDER STIMULATOR IMPLANT     COLONOSCOPY  2007   for rectal bleeding; [facility]   COLONOSCOPY N/A 06/11/2014   Procedure: COLONOSCOPY;  Surgeon: Iva Boop, MD;  Location: [facility];  Service: Endoscopy;  Laterality: N/A;   COLONOSCOPY WITH PROPOFOL N/A 03/02/2017   Procedure: COLONOSCOPY WITH PROPOFOL;  Surgeon: Ovidio Kin, MD;  Location: [facility];  Service: General;  Laterality: N/A;   DILATION AND CURETTAGE OF UTERUS     ESOPHAGOGASTRODUODENOSCOPY N/A 07/25/2023   Procedure: ESOPHAGOGASTRODUODENOSCOPY (EGD);  Surgeon: Lynann Bologna, DO;  Location: [facility];  Service: Gastroenterology;  Laterality: N/A;   ESOPHAGOGASTRODUODENOSCOPY (EGD) WITH PROPOFOL N/A 04/06/2016   Procedure: ESOPHAGOGASTRODUODENOSCOPY (EGD) WITH PROPOFOL;  Surgeon: Ovidio Kin, MD;  [facility]: [facility];  Service: General;  Laterality: N/A;   ESOPHAGOGASTRODUODENOSCOPY (EGD) WITH PROPOFOL N/A 03/02/2017   Procedure: ESOPHAGOGASTRODUODENOSCOPY (EGD) WITH PROPOFOL;  Surgeon: Ovidio Kin, MD;  Location: [facility];  Service: General;  Laterality: N/A;   ESOPHAGOGASTRODUODENOSCOPY (EGD) WITH PROPOFOL N/A 05/20/2017   Procedure: ESOPHAGOGASTRODUODENOSCOPY (EGD) WITH PROPOFOL ERAS PATHWAY;  Surgeon: Ovidio Kin, MD;  Location: [facility];  Service: General;  Laterality: N/A;   ESOPHAGOGASTRODUODENOSCOPY (EGD) WITH PROPOFOL N/A 10/07/2017   Procedure: ESOPHAGOGASTRODUODENOSCOPY (EGD) WITH PROPOFOL;  Surgeon: Ovidio Kin, MD;  Location: [facility];  Service: General;  Laterality: N/A;   ESOPHAGOGASTRODUODENOSCOPY (EGD) WITH PROPOFOL N/A 01/20/2023   Procedure: ESOPHAGOGASTRODUODENOSCOPY (EGD) WITH PROPOFOL;  Surgeon: Kerin Salen, MD;  [facility]: [facility];  Service: Gastroenterology;  Laterality: N/A;   EXCISION OF SKIN TAG  06/08/2017   Procedure: EXCISION OF VULVAR SKIN TAGS X2;  Surgeon: Reva Bores, MD;  Location: [facility];   Service: Gynecology;;   FOOT SURGERY Bilateral    x 2   GASTRIC ROUX-EN-Y N/A 12/14/2016   Procedure: LAPAROSCOPIC REVISION SLEEVE GASTRECTOMY TO  ROUX-Y-GASTRIC BY-PASS, UPPER ENDO;  Surgeon: Glenna Fellows, MD;  Location: [facility];  Service: General;  Laterality: N/A;   GASTROJEJUNOSTOMY N/A 05/11/2016   Procedure: LAPAROSCOPIC PLACEMENT  OF FEEDING  JEJUNOSTOMY TUBE;  Surgeon: Glenna Fellows, MD;  Location: [facility];  Service: General;  Laterality: N/A;   HEMORRHOIDECTOMY WITH HEMORRHOID BANDING     HEMOSTASIS CLIP PLACEMENT  07/25/2023   Procedure: HEMOSTASIS CLIP PLACEMENT;  Surgeon: Lynann Bologna, DO;  Location: [facility];  Service: Gastroenterology;;   HOT HEMOSTASIS N/A 07/25/2023   Procedure: HOT HEMOSTASIS (ARGON PLASMA COAGULATION/BICAP);  Surgeon: Lynann Bologna, DO;  Location: [facility];  Service: Gastroenterology;  Laterality: N/A;   IR FLUORO GUIDE CV LINE RIGHT  04/06/2017   IR GASTROSTOMY TUBE MOD SED  08/25/2023   IR GENERIC HISTORICAL  05/19/2016   IR CM INJ ANY COLONIC TUBE W/FLUORO 05/19/2016 Irish Lack, MD [facility]   IR PATIENT EVAL TECH 0-60 MINS  11/30/2017   IR REPLC  DUODEN/JEJUNO TUBE PERCUT W/FLUORO  03/14/2018   IR REPLC DUODEN/JEJUNO TUBE PERCUT W/FLUORO  03/15/2018   IR US GUIDE VASC ACCESS LEFT  08/25/2023   IR US GUIDE VASC ACCESS LEFT  08/25/2023   IR US GUIDE VASC ACCESS RIGHT  04/06/2017   j tube removed march 2018     KNEE ARTHROSCOPY Left 09/06/2014   LAPAROSCOPIC GASTRIC SLEEVE RESECTION N/A 01/14/2016   Procedure: LAPAROSCOPIC GASTRIC SLEEVE RESECTION;  Surgeon: Glenna Fellows, MD;  Location: [facility];  Service: General;  Laterality: N/A;   LAPAROSCOPIC REVISION OF GASTROJEJUNOSTOMY N/A 10/25/2017   Procedure: LAPAROSCOPIC REVISION OF GASTROJEJUNOSTOMY AND PARTIAL GASTRECTOMY, WITH PLACEMENT OF FEEDING GASTROSTOMY TUBE;  Surgeon: Glenna Fellows, MD;  Location: [facility];  Service: General;  Laterality: N/A;   LAPAROSCOPIC  TUBAL LIGATION  10/16/2011   Procedure: LAPAROSCOPIC TUBAL LIGATION;  Surgeon: Esmeralda Arthur, MD;  Location: [facility];  Service: Gynecology;  Laterality: Bilateral;   NOVASURE ABLATION  09/28/2010   mild persistent vaginal bleeding   right knee arthroscopy     05/07/2016   TOTAL KNEE ARTHROPLASTY Left 03/10/2018   Procedure: LEFT TOTAL KNEE ARTHROPLASTY;  Surgeon: Durene Romans, MD;  Location: [facility];  Service: Orthopedics;  Laterality: Left;  70 mins   TOTAL KNEE ARTHROPLASTY Right 01/23/2020   Procedure: TOTAL KNEE ARTHROPLASTY, BILATERAL TROCHANTERIC INJECTION;  Surgeon: Durene Romans, MD;  Location: [facility];  Service: Orthopedics;  Laterality: Right;  70 mins for Bilateral Troch injection 1 cc Depo and 2 CC Lidocaine   VAGINAL HYSTERECTOMY Bilateral 06/08/2017   Procedure: HYSTERECTOMY VAGINAL W/ BILATERAL SALPINGECTOMY;  Surgeon: Reva Bores, MD;  Location: [facility];  Service: Gynecology;  Laterality: Bilateral;   wisdom teeth extracted      Family History  Problem Relation Age of Onset   Hypertension Mother    Diabetes Mother    Sarcoidosis Mother        lungs and skin   Asthma Mother    Hypertension Father    Diabetes Father    Asthma Son    Tics Son    Asthma Sister    Cancer Sister        possible pancreatic cancer   Adrenal disorder Sister        Tumor    Asthma Brother    Asthma Daughter        died age 75.5   Cancer Daughter 4       brain; died age 75.5   Asthma Son    Cancer Paternal Aunt        brain, colon, lung and esophagus; unsure of primary    Breast cancer Paternal Aunt     Social:  reports that she quit smoking about 9 years ago. Her smoking use included cigarettes. She started smoking about 29 years ago. She has a 10 pack-year smoking history. She has never used smokeless tobacco. She reports that she does not currently use alcohol. She reports that she does not use drugs.  Allergies:  Allergies  Allergen Reactions   Ciprofloxacin Itching and Rash    Coconut (Cocos Nucifera) Anaphylaxis and Itching   Coconut Oil Anaphylaxis, Itching and Other (See Comments)   Morphine And Codeine Anaphylaxis, Hives and Other (See Comments)    Pt states that she has tolerated Norco and Dilaudid   Oxycodone Anaphylaxis   Oxycodone-Acetaminophen Anaphylaxis   Doxycycline Nausea And Vomiting and Other (See Comments)   Penicillamine Hives and Other (See Comments)   Trazodone And Nefazodone Rash  Vistaril [Hydroxyzine Hcl] Hives   Silicone    Penicillins Rash    Has patient had a PCN reaction causing immediate rash, facial/tongue/throat swelling, SOB or lightheadedness with hypotension: Yes Has patient had a PCN reaction causing severe rash involving mucus membranes or skin necrosis: No Has patient had a PCN reaction that required hospitalization No Has patient had a PCN reaction occurring within the last 10 years: No If all of the above answers are "NO", then may proceed with Cephalosporin use.   Tape Rash    And paper tape causes a rash if wearing for a prolong period of time    Medications: Current Outpatient Medications  Medication Instructions   albuterol (VENTOLIN HFA) 108 (90 Base) MCG/ACT inhaler 2 puffs, Inhalation, Every 6 hours PRN   atorvastatin (LIPITOR) 40 mg, Oral, Daily   botulinum toxin Type A (BOTOX) 200 units injection INJECT 155 UNITS INTRAMUSCULARLY INTO HEAD AND NECK MUSCLES EVERY 3 MONTHS   buPROPion (WELLBUTRIN XL) 300 mg, Oral, Every morning, Take along with 150 mg=450 mg    busPIRone (BUSPAR) 15 mg, Oral, 3 times daily   clonazePAM (KLONOPIN) 1 mg, Oral, 2 times daily PRN   cyanocobalamin (VITAMIN B12) 1,000 mcg, Intramuscular, 2 times weekly   diclofenac Sodium (VOLTAREN) 2 g, Topical, 4 times daily PRN   dicyclomine (BENTYL) 10 mg, Oral, 4 times daily   DULoxetine (CYMBALTA) 60 mg, Oral, 2 times daily   EPINEPHrine (EPI-PEN) 0.3 mg, Intramuscular, As needed   famotidine (PEPCID) 20 mg, Oral, 2 times daily   ferrous  sulfate 325 mg, Oral, Daily with breakfast   fluticasone (FLONASE) 50 MCG/ACT nasal spray 2 sprays, Each Nare, Daily PRN   gabapentin (NEURONTIN) 600 mg, Oral, 3 times daily   hydrocortisone (ANUSOL-HC) 2.5 % rectal cream 1 Application, Rectal, Daily PRN   HYDROmorphone (DILAUDID) 4 mg, Oral, Every  3 hours   linaclotide (LINZESS) 290 mcg, Oral, Daily before breakfast   magnesium oxide (MAG-OX) 400 mg, Oral, Daily   meclizine (ANTIVERT) 25 mg, Oral, 3 times daily   mesalamine (LIALDA) 2.4 g, Oral, Daily with breakfast   midodrine (PROAMATINE) 15 mg, Oral, 3 times daily with meals   Multiple Vitamin (MULTIVITAMIN WITH MINERALS) TABS tablet 1 tablet, Oral, Daily   NARCAN 4 MG/0.1ML LIQD nasal spray kit 1 spray, Nasal, Daily PRN   omeprazole (KONVOMEP) 20 mg, Oral, 2 times daily before meals   ondansetron (ZOFRAN) 8 mg, Oral, Every 8 hours PRN   oxybutynin (DITROPAN) 10 mg, Oral, Daily at bedtime   Qulipta 30 mg, Oral, Daily   sucralfate (CARAFATE) 1 g, Oral, Every 6 hours   SUMAtriptan (IMITREX) 50 mg, Oral, Every 2 hours PRN, May repeat in 2 hours if headache persists or recurs.   Symproic 0.2 mg, Oral, Daily   thiamine (VITAMIN B-1) 100 mg, Oral, Daily   torsemide (DEMADEX) 5 mg, Oral, Daily   valACYclovir (VALTREX) 1,000 mg, Oral, 2 times daily PRN   Vitamin D3 50,000 Units, Oral, Every 7 days    ROS - all of the below systems have been reviewed with the patient and positives are indicated with bold text General: chills, fever or night sweats Eyes: blurry vision or double vision ENT: epistaxis or sore throat Allergy/Immunology: itchy/watery eyes or nasal congestion Hematologic/Lymphatic: bleeding problems, blood clots or swollen lymph nodes Endocrine: temperature intolerance or unexpected weight changes Breast: new or changing breast lumps or nipple discharge Resp: cough, shortness of breath, or wheezing CV: chest pain or dyspnea on  exertion GI: as per HPI GU: dysuria, trouble  voiding, or hematuria MSK: joint pain or joint stiffness Neuro: TIA or stroke symptoms Derm: pruritus and skin lesion changes Psych: anxiety and depression  Objective   PE Blood pressure (!) 132/96, pulse 76, temperature 98.8 F (37.1 C), temperature source Oral, resp. rate 18, height 5\' 7"  (1.702 m), weight 56.7 kg, last menstrual period 06/02/2017, SpO2 99%. Constitutional: NAD; conversant; no deformities Eyes: Moist conjunctiva; no lid lag; anicteric; PERRL Neck: Trachea midline; no thyromegaly Lungs: Normal respiratory effort; no tactile fremitus CV: RRR; no palpable thrills; no pitting edema GI: Abd Soft, nontender; no palpable hepatosplenomegaly MSK: Normal range of motion of extremities; no clubbing/cyanosis Psychiatric: Appropriate affect; alert and oriented x3 Lymphatic: No palpable cervical or axillary lymphadenopathy  No results found for this or any previous visit (from the past 24 hours).  Imaging Orders  No imaging studies ordered today  CT A Abd/Pel 07/25/23  1. Status post gastric bypass with bowel wall thickening and edema at the gastrojejunostomy anastomosis, which may be infectious or inflammatory. 2. Small hiatal hernia with mild thickening of the walls of the distal esophagus with associated edema, suggesting esophagitis.   EGD w/ Dr. Lorenso Quarry 08/06/23  Assessment and Plan   [name] has a complicated past bariatric surgery history. She had a sleeve gastrectomy which she did not tolerate well so she was converted to a gastric bypass and immediately began having marginal ulcer issues. She now has been through a revision of her gastrojejunostomy with resection of about half of her pouch and continues to have marginal ulcer issues. She denies tobacco or NSAID use, and it is not clear the cause of her ongoing ulcer problems. She does appear malnourished on physical exam with temporal wasting and states she is unable to eat solid foods and relies mostly on protein  shakes. I recommend replacement of her gastrostomy tube for nutrition while we workup the cause of her ulcer issues. IR was unable to replace the G tube so she is here today for laparoscopic remnant gastrostomy tube placement.   We discussed the procedure, its risks, benefits and alternatives and the patient granted consent to proceed.     Quentin Ore, MD  [facility] Use [email or web address] to contact on call provider

## 2023-09-14 NOTE — Plan of Care (Signed)

## 2023-09-14 NOTE — Anesthesia Procedure Notes (Signed)
Procedure Name: Intubation Date/Time: 09/14/2023 7:41 AM  Performed by: Randa Evens, CRNAPre-anesthesia Checklist: Patient identified, Emergency Drugs available, Suction available and Patient being monitored Patient Re-evaluated:Patient Re-evaluated prior to induction Oxygen Delivery Method: Circle System Utilized Preoxygenation: Pre-oxygenation with 100% oxygen Induction Type: IV induction Ventilation: Mask ventilation without difficulty Laryngoscope Size: Mac and 3 Grade View: Grade I Tube type: Oral Number of attempts: 1 Airway Equipment and Method: Stylet and Oral airway Placement Confirmation: ETT inserted through vocal cords under direct vision, positive ETCO2 and breath sounds checked- equal and bilateral Secured at: 21 cm Tube secured with: Tape Dental Injury: Teeth and Oropharynx as per pre-operative assessment

## 2023-09-14 NOTE — Op Note (Signed)
Lutz: [name] (07-13-1976, [id number])  Date of Surgery: 09/14/2023  Preoperative Diagnosis: malnutrition   Postoperative Diagnosis: malnutrition   Surgical Procedure: LAPAROSCOPIC REMNANT GASTROSTOMY TUBE PLACEMENT: 08657 (CPT)   Operative Team Members:  Surgeons and Role:    * Faizah Kandler, Hyman Hopes, MD - Primary   Anesthesiologist: Elmer Picker, MD CRNA: Randa Evens, CRNA   Anesthesia: General   Fluids:  No intake/output data recorded.  Complications: None  Drains:  none   Specimen: None  Disposition:  PACU - hemodynamically stable.  Plan of Care: Admit to inpatient  -Lutz will require titration of remnant gastrostomy tube feeds and electrolyte monitoring for refeeding syndrome.    Indications for Procedure:  [name] has a complicated past bariatric surgery history. She had a sleeve gastrectomy which she did not tolerate well so she was converted to a gastric bypass and immediately began having marginal ulcer issues. She now has been through a revision of her gastrojejunostomy with resection of about half of her pouch and continues to have marginal ulcer issues. She denies tobacco or NSAID use, and it is not clear the cause of her ongoing ulcer problems. She does appear malnourished on physical exam with temporal wasting and states she is unable to eat solid foods and relies mostly on protein shakes. I recommend replacement of her gastrostomy tube for nutrition while we workup the cause of her ulcer issues. IR was unable to replace the G tube so she is here today for laparoscopic remnant gastrostomy tube placement.   We discussed the procedure, its risks, benefits and alternatives and the Lutz granted consent to proceed.    Findings: remnant stomach still attached to abdominal wall.  Able to replace g-tube without additional dissection or pexy required.   Description of Procedure:   On the date stated above the Lutz was taken operating  room suite and placed in supine position.  General endotracheal anesthesia was induced.  Timeout was completed verifying the correct Lutz, procedure, position, and equipment needed for the case.  Lutz's abdomen was prepped and draped in usual sterile fashion.  I made a 5 mm incision below the umbilicus.  I grasped and elevated the umbilical stalk with a Coker clamp and inserted the Veress needle into the abdomen inflating the abdomen to 15 mmHg.  A 5 mm trocar was then placed in the right upper quadrant using optical technique.  I was able to enter the abdomen safely.  2 additional trocars were placed, 1 at the initial 5 mm trocar infraumbilical incision and 1 in the left lower quadrant.  I inspected the upper abdomen.  The remnant stomach was still pexied to the abdominal wall.  I made an incision at the previous gastrostomy tube site.  I inserted the Veress needle into the remnant stomach through the previous G-tube track.  The remnant stomach inflated with insufflation through the Veress needle.  I then placed a 5 mm trocar through the same tract and inserted the laparoscope through the trocar to confirm intraluminal positioning of the 5 mm trocar.  I then removed the trocar and placed the 20 French gastrostomy tube through the same tract using an insertion device.  The insufflation was then blown through the gastrostomy tube to confirm intraluminal placement in the remnant stomach inflated.  The balloon was inflated 10 mmHg and the tube was positioned at 2 cm at the skin.  The Lutz tolerated the procedure well.  The abdomen was deflated and the trocars removed.  The three 5 mm incisions were closed using 4-0 Monocryl and Dermabond.  All sponge and needle counts were correct at the end of this case.   At the end of the case we reviewed the infection status of the case. Lutz: [name] Elective Case Case: Elective Infection Present At Time Of Surgery (PATOS): None  Ivar Drape,  MD General, Bariatric, & Minimally Invasive Surgery [facility], [place]

## 2023-09-15 ENCOUNTER — Encounter (HOSPITAL_COMMUNITY): Payer: Self-pay | Admitting: Surgery

## 2023-09-15 ENCOUNTER — Inpatient Hospital Stay (HOSPITAL_COMMUNITY): Payer: Medicare Other

## 2023-09-15 LAB — CBC
HCT: 27.9 % — ABNORMAL LOW (ref 36.0–46.0)
Hemoglobin: 8.8 g/dL — ABNORMAL LOW (ref 12.0–15.0)
MCH: 29.7 pg (ref 26.0–34.0)
MCHC: 31.5 g/dL (ref 30.0–36.0)
MCV: 94.3 fL (ref 80.0–100.0)
Platelets: 392 10*3/uL (ref 150–400)
RBC: 2.96 MIL/uL — ABNORMAL LOW (ref 3.87–5.11)
RDW: 13.3 % (ref 11.5–15.5)
WBC: 10.2 10*3/uL (ref 4.0–10.5)
nRBC: 0 % (ref 0.0–0.2)

## 2023-09-15 LAB — MAGNESIUM: Magnesium: 2.3 mg/dL (ref 1.7–2.4)

## 2023-09-15 LAB — GLUCOSE, CAPILLARY
Glucose-Capillary: 125 mg/dL — ABNORMAL HIGH (ref 70–99)
Glucose-Capillary: 85 mg/dL (ref 70–99)
Glucose-Capillary: 60 mg/dL — ABNORMAL LOW (ref 70–99)
Glucose-Capillary: 128 mg/dL — ABNORMAL HIGH (ref 70–99)
Glucose-Capillary: 91 mg/dL (ref 70–99)
Glucose-Capillary: 147 mg/dL — ABNORMAL HIGH (ref 70–99)
Glucose-Capillary: 189 mg/dL — ABNORMAL HIGH (ref 70–99)

## 2023-09-15 LAB — BASIC METABOLIC PANEL
Anion gap: 7 (ref 5–15)
BUN: 12 mg/dL (ref 6–20)
CO2: 25 mmol/L (ref 22–32)
Calcium: 9 mg/dL (ref 8.9–10.3)
Chloride: 106 mmol/L (ref 98–111)
Creatinine, Ser: 0.63 mg/dL (ref 0.44–1.00)
GFR, Estimated: >60 mL/min (ref >60)
Glucose, Bld: 114 mg/dL — ABNORMAL HIGH (ref 70–99)
Potassium: 4 mmol/L (ref 3.5–5.1)
Sodium: 138 mmol/L (ref 135–145)

## 2023-09-15 LAB — PHOSPHORUS: Phosphorus: 3.4 mg/dL (ref 2.5–4.6)

## 2023-09-15 MED ORDER — OMEPRAZOLE 2 MG/ML ORAL SUSPENSION: 40.0000 mg | Freq: Two times a day (BID) | ORAL | Status: DC

## 2023-09-15 MED ORDER — OSMOLITE 1.5 CAL PO LIQD
1000.0000 mL | ORAL | Status: DC
  Administered 2023-09-15: 1000 mL
  Filled 2023-09-15 (×2): qty 1000

## 2023-09-15 MED ORDER — PANTOPRAZOLE SODIUM 40 MG IV SOLR
40.0000 mg | Freq: Two times a day (BID) | INTRAVENOUS | Status: DC
  Administered 2023-09-15 – 2023-09-16 (×2): 40 mg via INTRAVENOUS
  Filled 2023-09-15 (×2): qty 10

## 2023-09-15 MED ORDER — IOHEXOL 300 MG/ML  SOLN
100.0000 mL | Freq: Once | INTRAMUSCULAR | Status: AC | PRN
  Administered 2023-09-15: 100 mL via ORAL

## 2023-09-15 NOTE — Evaluation (Signed)
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 Pt seen here for laparascopic remnant gastromy tube placement to get nutrition while they explore reasons for ongoing ulcer.   Clinical Impression   Pt admitted with the above diagnosis and has the deficits outlined below. Pt would benefit from OT to address energy conservation education and safety with balance during adls. Pt lives with children who assist her with bathing and dressing when needed. Pt states she falls 3x week needing assist to get up and children assist with IADLS. Will continue to see for basic balance instruction as it relates to adls and energy conservation education. Pt is overall supervision with most adls and did not show loss of balance in her room.  Feel pt will be able to d/c back home when medically stable as family is there 80% of the time.        If plan is discharge home, recommend the following: A little help with bathing/dressing/bathroom;Assistance with cooking/housework;Help with stairs or ramp for entrance    Functional Status Assessment  Patient has had a recent decline in their functional status and demonstrates the ability to make significant improvements in function in a reasonable and predictable amount of time.  Equipment Recommendations  None recommended by OT (pt would benefit from 3:1 for shower chair but if not covered does not want it.)    Recommendations for Other Services       Precautions / Restrictions Precautions Precautions: Fall Precaution Comments: Pt falls at least 3x a week Restrictions Weight Bearing Restrictions Per Provider Order: No       Mobility Bed Mobility Overal bed mobility: Modified Independent             General bed mobility comments: bedrail use only    Transfers Overall transfer level: Needs assistance Equipment used: Rolling walker (2 wheels) Transfers: Sit to/from Stand, Bed to chair/wheelchair/BSC Sit to Stand: Supervision     Step pivot transfers: Contact guard assist     General transfer comment: cg assist for balance      Balance Overall balance assessment: Mild deficits observed, not formally tested                                         ADL either performed or assessed with clinical judgement   ADL Overall ADL's : Needs assistance/impaired Eating/Feeding: Independent;Sitting Eating/Feeding Details (indicate cue type and reason): Pt eats some food by mouth and some by tube. Grooming: Wash/dry hands;Wash/dry face;Oral care;Supervision/safety;Standing   Upper Body Bathing: Set up;Sitting   Lower Body Bathing: Contact guard assist;Sit to/from stand;Cueing for compensatory techniques   Upper Body Dressing : Set up;Sitting   Lower Body Dressing: Contact guard assist;Sit to/from stand;Cueing for compensatory techniques   Toilet Transfer: Supervision/safety;Ambulation;Comfort height toilet;Rolling walker (2 wheels);Grab bars Toilet Transfer Details (indicate cue type and reason): pt walked to bathroom with walker Toileting- Clothing Manipulation and Hygiene: Supervision/safety;Sit to/from stand       Functional mobility during ADLs: Contact guard assist;Rolling walker (2 wheels) General ADL Comments: Pt states somet times she needs help at home and often she  does not. Pt overall at min guard to supervision level. Pt fatigues quickly.     Vision Baseline Vision/History: 1 Wears glasses Ability to See in Adequate Light: 0 Adequate Patient Visual Report: No change from baseline Vision Assessment?: No apparent visual deficits Additional Comments: wears glasses at  all times     Perception Perception: Within Functional Limits       Praxis Praxis: [facility]       Pertinent Vitals/Pain Pain Assessment Pain Assessment: 0-10 Pain Score: 7  Pain Location: stomach at operative sites, back of neck and trap areas as well as lower back. Pain Descriptors / Indicators: Aching, Discomfort, Sore Pain Intervention(s): Limited activity within patient's tolerance, Monitored during session, Repositioned     Extremity/Trunk Assessment Upper Extremity Assessment Upper Extremity Assessment: Overall WFL for tasks assessed   Lower Extremity Assessment Lower Extremity Assessment: Defer to PT evaluation   Cervical / Trunk Assessment Cervical / Trunk Assessment: Normal   Communication Communication Communication: No apparent difficulties   Cognition Arousal: Alert Behavior During Therapy: Flat affect Overall Cognitive Status: Within Functional Limits for tasks assessed                                       General Comments  Pt steady on feet in room for this OT. Pt safe to walk to bathroom with walker or holding IV pole.    Exercises     Shoulder Instructions      Home Living Family/patient expects to be discharged to:: Private residence Living Arrangements: Children Available Help at Discharge: Family Type of Home: Apartment Home Access: Stairs to enter [employer] of Steps: 1 at the door Entrance Stairs-Rails: None Home Layout: One level     Bathroom Shower/Tub: Tub/shower unit;Curtain   Firefighter: Standard     Home Equipment: Cane - single Librarian, academic (2 wheels)          Prior Functioning/Environment Prior Level of Function : History of Falls (last six months);Needs assist       Physical Assist : Mobility (physical);ADLs (physical) Mobility (physical): Stairs;Gait ADLs (physical): Bathing;IADLs;Dressing;Grooming Mobility Comments: Pt uses cane unless she is walking the dog then uses walker.   Says she ties the dogs to the walker and they do not pull the walker out from under her.  Pt falls at least 3x week and sometimes needs assist getting up. ADLs Comments: kids assist with all cooking and cleaning.  Kids assist helping you into shower and sometimes help with bathing and dressing. Pt sometimes drives        OT Problem List: Impaired balance (sitting and/or standing);Pain      OT Treatment/Interventions: Self-care/ADL training;Balance training;DME and/or AE instruction    OT Goals(Current goals can be found in the care plan section) Acute Rehab OT Goals Patient Stated Goal: to be stronger and stop falling OT Goal Formulation: With patient Time For Goal Achievement: 09/29/23 Potential to Achieve Goals: Good ADL Goals Additional ADL Goal #1: Pt will walk to bathroom and complete all toileting with mod I Additional ADL Goal #2: Pt will gather all clothing and dress self with mod I Additional ADL Goal #3: Pt will state 3 things she can do at home to conserve energy during adl routine.  OT Frequency: Min 1X/week    Co-evaluation              AM-PAC OT "6 Clicks" Daily Activity  Outcome Measure Help from another person eating meals?: None Help from another person taking care of personal grooming?: A Little Help from another person toileting, which includes using toliet, bedpan, or urinal?: A Little Help from another person bathing (including washing, rinsing, drying)?: A Little Help from another person to put on and taking off regular upper body clothing?: A Little Help from another person to put on and taking off regular lower body clothing?: A Little 6 Click Score: 19   End of Session Equipment Utilized During Treatment: Rolling walker (2 wheels) Nurse Communication: Mobility status;Other (comment) (pt ok to walk to bathroom with IV pole)  Activity Tolerance: Patient limited by fatigue Patient left: in bed;with call bell/phone within reach  OT Visit Diagnosis:  Unsteadiness on feet (R26.81)                Time: 4098-1191 OT Time Calculation (min): 29 min Charges:  OT General Charges $OT Visit: 1 Visit OT Evaluation $OT Eval Moderate Complexity: 1 Mod OT Treatments $Self Care/Home Management : 8-22 mins  Hope Budds 09/15/2023, 2:22 PM

## 2023-09-15 NOTE — Progress Notes (Addendum)
Progress Note: General Surgery Service   Chief Complaint/Subjective: Doing okay POD1.  Some soreness  Objective: Vital signs in last 24 hours: Temp:  [97.2 F (36.2 C)-98.7 F (37.1 C)] 97.9 F (36.6 C) (12/18 0526) Pulse Rate:  [59-85] 67 (12/18 0526) Resp:  [10-18] 14 (12/18 0526) BP: (97-150)/(56-84) 112/73 (12/18 0526) SpO2:  [96 %-100 %] 99 % (12/18 0526) Weight:  [61.4 kg] 61.4 kg (12/18 0500) Last BM Date : 09/13/23  Intake/Output from previous day: 12/17 0701 - 12/18 0700 In: 2816.8 [P.O.:850; I.V.:1448; NG/GT:418.8; IV Piggyback:100] Out: 1105 [Urine:1100; Blood:5] Intake/Output this shift: No intake/output data recorded.  Constitutional: NAD; conversant; no deformities Eyes: Moist conjunctiva; no lid lag; anicteric; PERRL Neck: Trachea midline; no thyromegaly Lungs: Normal respiratory effort; no tactile fremitus CV: RRR; no palpable thrills; no pitting edema GI: Abd Incisions c/d/I tube functioning well; no palpable hepatosplenomegaly MSK: Normal range of motion of extremities; no clubbing/cyanosis Psychiatric: Appropriate affect; alert and oriented x3 Lymphatic: No palpable cervical or axillary lymphadenopathy  Lab Results: CBC  Recent Labs    09/14/23 0908 09/15/23 0456  WBC 7.9 10.2  HGB 9.0* 8.8*  HCT 28.3* 27.9*  PLT 330 392   BMET Recent Labs    09/14/23 0908 09/15/23 0456  NA  --  138  K  --  4.0  CL  --  106  CO2  --  25  GLUCOSE  --  114*  BUN  --  12  CREATININE 0.72 0.63  CALCIUM  --  9.0   PT/INR No results for input(s): "LABPROT", "INR" in the last 72 hours. ABG No results for input(s): "PHART", "HCO3" in the last 72 hours.  Invalid input(s): "PCO2", "PO2"  Anti-infectives: Anti-infectives (From admission, onward)    Start     Dose/Rate Route Frequency Ordered Stop   09/14/23 1512  valACYclovir (VALTREX) tablet 1,000 mg        1,000 mg Oral 2 times daily PRN 09/14/23 1512     09/14/23 0615  ceFAZolin (ANCEF) IVPB 2g/100  mL premix        2 g 200 mL/hr over 30 Minutes Intravenous On call to O.R. 09/14/23 0601 09/14/23 0750       Medications: Scheduled Meds:  Atogepant  30 mg Oral Daily   atorvastatin  40 mg Oral Daily   buPROPion  450 mg Oral q morning   busPIRone  15 mg Oral TID   dicyclomine  10 mg Oral QID   DULoxetine  60 mg Oral BID   famotidine  20 mg Oral BID   gabapentin  600 mg Oral TID   heparin injection (subcutaneous)  5,000 Units Subcutaneous Q8H   linaclotide  290 mcg Oral QAC breakfast   magnesium oxide  400 mg Oral Daily   meclizine  25 mg Oral TID   mesalamine  2.4 g Oral Q breakfast   midodrine  15 mg Oral TID WC   Naldemedine Tosylate  0.2 mg Oral Daily   omeprazole  40 mg Oral BID AC   oxybutynin  10 mg Oral QHS   Ensure Max Protein  11 oz Oral Daily   sucralfate  1 g Oral Q6H   thiamine  100 mg Oral Daily   Continuous Infusions:  feeding supplement (OSMOLITE 1.2 CAL) 40 mL/hr at 09/15/23 0334   lactated ringers 75 mL/hr at 09/14/23 2202   PRN Meds:.albuterol, clonazePAM, EPINEPHrine, fluticasone, hydrocortisone, HYDROmorphone, naLOXone (NARCAN)  injection, ondansetron **OR** ondansetron (ZOFRAN) IV, prochlorperazine **OR** prochlorperazine, simethicone, SUMAtriptan,  valACYclovir  Assessment/Plan: s/p Procedure(s): LAPAROSCOPIC REMNANT GASTROSTOMY TUBE PLACEMENT 09/14/2023  Doing well POD1 Electrolytes normal without signs of refeeding syndrome Advance tube feeds to goal - plan for 24 hour continuous feeds at home Social work consult Upper GI x-ray - please don't stop tube feeds for x-ray as we are not evaluating the remnant stomach, just the pouch and roux limb   LOS: 1 day    Quentin Ore, MD  [facility] Use [email or web address] to contact on call provider  Daily Billing: [id number] - post op

## 2023-09-15 NOTE — Discharge Instructions (Signed)
Vitamin and Mineral Recommendations    Chewable Multivitamins: Take multivitamin daily.              Bariatric Advantage Advanced Multi EA Celebrate MultiComplete with 36 or 45 mg Iron  Opurity: Bypass and Sleeve Optimized Chewable             [employer] [facility]: Bariatric Multivitamin with 45mg  Iron Chewable                                                   3 Chewable Calcium (500 mg each, total 1200-1500 mg per day)                   *Any Calcium brand will do Calcium Citrate or Calcium Carbonate Calcium Citrate chews can be taken with or without food, choose calcium citrate if you have ever had a kidney stone Calcium carbonate needs to be taken with food   What your multivitamin should provide: Bariatric Requirements (Daily) Sleeve & RNY Gastric Bypass What My Supplements Provide Additional Notes  Vitamin B1 (Thiamin) 12-50 mg.    B12 350-500 mcg.  -If taking B12 via nasal spray, take as directed -If receiving B12 shot, 1,000 mcg. monthly  Folic Acid 400-800 mcg.  If female of childbearing age, need 800-1,000 mcg.  Vitamin A 5,000-10,000 IU    Vitamin D 3,000 IU    Vitamin E 15 mg    Vitamin K 90-120 mcg    Zinc Sleeve: 8-11 mg RNY: 8-22 mg    Copper Sleeve: 1 mg RNY: 2 mg  Copper gluconate or Copper sulfate  Iron 18 mg. if low risk 45-60 mg. if high risk*  *High risk = history of iron def. anemia, or menstruating female       Calcium  1,200-1,500 mg    -Calcium carbonate with food

## 2023-09-15 NOTE — Evaluation (Signed)
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 Pt seen here for laparascopic remnant gastromy tube placement to get nutrition while they explore reasons for ongoing ulcer.  Clinical Impression  Pt admitted with above diagnosis.  Pt mostly modified independent at baseline, amb with RW. Pt amb hallway distance today with RW and CGA. Has LOB x2 in standing while using phone and then while reaching. Pt will likely not need f/u PT, should continue to mobilize as much as possible, will benefit from PT in acute setting  Pt currently with functional limitations due to the deficits listed below (see PT Problem List). Pt will benefit from acute skilled PT to increase their independence and safety with mobility to allow discharge.           If plan is discharge home, recommend the following: Assistance with cooking/housework;Help with stairs or ramp for entrance   Can travel by private vehicle        Equipment Recommendations Other (comment) (pt requests rollator, has RW)  Recommendations for Other Services       Functional Status Assessment Patient has had a recent decline in their functional status and demonstrates the ability to make significant improvements in function in a reasonable and predictable amount of time.     Precautions / Restrictions Precautions Precautions: Fall Precaution Comments: Pt falls at least 3x a week Restrictions Weight Bearing Restrictions Per Provider Order: No      Mobility  Bed Mobility Overal bed mobility: Modified Independent             General bed mobility comments: bedrail use only, HOB elevated     Transfers Overall transfer level: Needs assistance Equipment used: None, Rolling walker (2 wheels) Transfers: Sit to/from Stand Sit to Stand: Supervision, Contact guard assist           General transfer comment: LOB x2 in standing. steady with UE support - RW    Ambulation/Gait Ambulation/Gait assistance: Contact guard assist, Supervision Gait Distance (Feet): 200 Feet Assistive device: Rolling walker (2 wheels) Gait Pattern/deviations: Step-through pattern       General Gait Details: cues for RW position with turns. steady gait with RW  no overt LOB  Stairs            Wheelchair Mobility     Tilt Bed    Modified Rankin (Stroke Patients Only)       Balance Overall balance assessment: Needs assistance Sitting-balance support: Feet supported, No upper extremity supported Sitting balance-Leahy Scale: Normal Sitting balance - Comments: pt i s able teach in all directions and recover, dons socks I'ly (some pain with twisting and reaching posteriorly)   Standing balance support: Reliant on assistive device for balance Standing balance-Leahy Scale: Fair Standing balance comment: fair to good; pt is able to wt shift laterally and reach, bend etc however has LOB x2 needing assist to recover x2. reactions delyed                             Pertinent Vitals/Pain Pain Assessment Pain Assessment: 0-10 Pain Score: 4  Pain Location: stomach at operative sites Pain Descriptors / Indicators: Aching, Discomfort, Sore  Pain Intervention(s): Limited activity within patient's tolerance, Monitored during session, Repositioned    Home Living Family/patient expects to be discharged to:: Private residence Living Arrangements: Children Available Help at Discharge: Family Type of Home: Apartment Home Access: Stairs to enter Entrance Stairs-Rails: None Entrance Stairs-Number of Steps: 1 at the door   Home Layout: One level Home Equipment: Cane - single  Librarian, academic (2 wheels) Additional Comments: pt would like a rollator; 17yo and 61 yo sons live with her    Prior Function Prior Level of Function : History of Falls (last six months);Needs assist       Physical Assist : Mobility (physical);ADLs (physical) Mobility (physical): Stairs;Gait ADLs (physical): Bathing;IADLs;Dressing;Grooming Mobility Comments: Pt uses cane unless she is walking the dog then uses walker.  Says she ties the dogs to the walker and they do not pull the walker out from under her.  Pt falls at least 3x week and sometimes needs assist getting up. ADLs Comments: kids assist with all cooking and cleaning.  Kids assist helping  into shower and sometimes help with bathing and dressing. Pt sometimes drives     Extremity/Trunk Assessment   Upper Extremity Assessment Upper Extremity Assessment: Overall WFL for tasks assessed    Lower Extremity Assessment Lower Extremity Assessment: Overall WFL for tasks assessed    Cervical / Trunk Assessment Cervical / Trunk Assessment: Normal  Communication   Communication Communication: No apparent difficulties  Cognition Arousal: Alert Behavior During Therapy: WFL for tasks assessed/performed Overall Cognitive Status: Within Functional Limits for tasks assessed                                          General Comments General comments (skin integrity, edema, etc.): Pt steady on feet in room for this OT. Pt safe to walk to bathroom with walker or holding IV pole.    Exercises     Assessment/Plan    PT Assessment Patient needs continued PT services  PT Problem List Decreased activity tolerance;Decreased balance;Pain       PT Treatment Interventions Balance training;Gait training;Functional mobility training;Therapeutic exercise;Therapeutic activities;Patient/family education    PT Goals (Current goals can be found in the Care Plan section)  Acute Rehab PT Goals PT Goal Formulation: With  patient Time For Goal Achievement: 09/22/23 Potential to Achieve Goals: Good    Frequency Min 1X/week     Co-evaluation               AM-PAC PT "6 Clicks" Mobility  Outcome Measure Help needed turning from your back to your side while in a flat bed without using bedrails?: None Help needed moving from lying on your back to sitting on the side of a flat bed without using bedrails?: None Help needed moving to and from a bed to a chair (including a wheelchair)?: A Little Help needed standing up from a chair using your arms (e.g., wheelchair or bedside chair)?: A Little Help needed to walk in hospital room?: A Little Help needed climbing 3-5 steps with a railing? : A Little 6 Click Score: 20    End of Session Equipment Utilized During Treatment: Gait belt Activity Tolerance: Patient tolerated treatment well Patient left: in bed;with call bell/phone within reach;with family/visitor present   PT Visit Diagnosis: Other abnormalities of gait and mobility (R26.89)    Time: 6213-0865 PT Time Calculation (min) (ACUTE ONLY): 28 min   Charges:  PT Evaluation $PT Eval Low Complexity: 1 Low PT Treatments $Gait Training: 8-22 mins PT General Charges $$ ACUTE PT VISIT: 1 Visit         Cylan Borum, PT  Acute Rehab Dept [facility]) [phone]  09/15/2023   [facility] 09/15/2023, 4:01 PM

## 2023-09-15 NOTE — TOC Progression Note (Signed)
Transition of Care [facility]) - Progression Note    Patient Details  Name: [name] MRN: [id number] Date of Birth: [birth year]  [facility] CM/SW Contact  Howell Rucks, RN Phone Number: 09/15/2023, 11:45 AM  Clinical Narrative:   Met with pt at bedside to introduce role of TOC/NCM and review for dc planning. TOC consult for dc home with 24 hr continuous tube feeds. Pt reports she resides with her sons who assist with her care and provide transportation, reports she has a PCP and pharmacy in place, no current home care services, home DME: walker, cane. Pt ageeable to [facility] for tube feeds. Pt requesting resources for financial assistance with rent and home personal care services. NCM will provide resources.   -11:48am call to Pam with [facility], referral for home 24 hr continuous feeds via gtube, accepted referral. TOC will continue to follow.     Expected Discharge Plan: Home w Home Health Services Barriers to Discharge: Continued Medical Work up  Expected Discharge Plan and Services   Discharge Planning Services: CM Consult   Living arrangements for the past 2 months: Apartment                             HH Agency: [facility] Date HH Agency Contacted: 09/15/23 Time HH Agency Contacted: 1144 Representative spoke with at [facility] Agency: Pam   Social Determinants of Health (SDOH) Interventions SDOH Screenings   Food Insecurity: Food Insecurity Present (09/14/2023)  Housing: Unknown (09/14/2023)  Transportation Needs: No Transportation Needs (09/14/2023)  Utilities: At Risk (09/14/2023)  Alcohol Screen: Medium Risk (04/08/2019)  Depression (PHQ2-9): High Risk (08/25/2023)  Financial Resource Strain: Low Risk  (04/08/2019)  Physical Activity: Inactive (04/08/2019)  Social Connections: Unknown (01/29/2022)   Received from [facility]  Stress: Stress Concern Present (04/08/2019)  Tobacco Use: Medium Risk  (09/14/2023)    Readmission Risk Interventions    09/15/2023   11:43 AM 07/30/2023   11:59 AM  Readmission Risk Prevention Plan  Transportation Screening Complete Complete  PCP or Specialist Appt within 5-7 Days  Complete  PCP or Specialist Appt within 3-5 Days Complete   Home Care Screening  Complete  Medication Review (RN CM)  Complete  HRI or Home Care Consult Complete   Social Work Consult for Recovery Care Planning/Counseling Complete   Palliative Care Screening Not Applicable   Medication Review Oceanographer) Complete

## 2023-09-15 NOTE — Progress Notes (Addendum)
Initial Nutrition Assessment  INTERVENTION:   Monitor magnesium, potassium, and phosphorus for at least 3 days, MD to replete as needed, as pt is at risk for refeeding syndrome. -Agree with Thiamine 100 mg daily   -Switch to Osmolite 1.5 @ 40 ml/hr, advance by 10 ml after 4 hours to goal rate of 50 ml/hr. -Provides 1800 kcals, 75g protein and 914 ml H2O -Recommend 30 ml free water every 8 hours to maintain tube patency  -Recommend additional fluid: 240 ml TID via PO or tube  -Ensure MAX Protein po daily, each supplement provides 150 kcal and 30 grams of protein   Resume vitamin regimen once able -Multivitamin with minerals BID or bariatric MVI daily -Calcium carbonate or citrate TID with meals -Placed Vitamin/Mineral recommendations in AVS   NUTRITION DIAGNOSIS:   Increased nutrient needs related to chronic illness, altered GI function as evidenced by estimated needs.  GOAL:   Patient will meet greater than or equal to 90% of their needs  MONITOR:   PO intake, Supplement acceptance, Labs, Weight trends, I & O's, TF tolerance  REASON FOR ASSESSMENT:   Consult Enteral/tube feeding initiation and management  ASSESSMENT:   47 y.o. 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 Recently, she had significant bleeding from this ulcer which took her into the hospital where she required emergent EGD. 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 She relies mostly on protein shakes.  12/17: s/p LAPAROSCOPIC REMNANT GASTROSTOMY TUBE PLACEMENT   Patient not in room at time of visit. Will speak with patient at a later time.  Pt last seen by inpatient nutrition team in April 2024. Pt has been followed by outpatient RD, last seen 11/27. Pt has struggled to maintain PO intakes since her initial surgery in 2017. Has been subsisting on low calorie, high protein protein shakes  which unfortunately cannot meet her kcal needs.  Has had a feeding tube before. Had G-tube placed in remnant stomach yesterday. Has tolerated Osmolite 1.2 so far. Will switch formula to 1.5 to better meet kcal and protein needs.  Pt able to take in some PO. Taking meds and vitamins.  Ensure Max has been ordered and pt accepting (1 only provides 150 kcals and 30g protein). Per surgery note, plan will be to discharge on 24 hr feeds continuously. Continuous infusion recommended when feeding via gastric remnant given limitations in volume.   Per weight records, pt has lost 32 lbs since 6/2 (19% wt loss x 6 months, significant for time frame). Suspect malnutrition continues.  Medications: Bentyl, Pepcid, MAG-OX, Carafate, Zofran  Labs reviewed CBGs: 85-138   NUTRITION - FOCUSED PHYSICAL EXAM:  Unable to complete, pt not in room. Will re-attempt at follow-up  Diet Order:   Diet Order             Diet bariatric advanced Room service appropriate? Yes; Fluid consistency: Thin  Diet effective now                   EDUCATION NEEDS:   Not appropriate for education at this time  Skin:  Skin Assessment: Reviewed RN Assessment  Last BM:  12/16  Height:   Ht Readings from Last 1 Encounters:  09/14/23 5\' 7"  (1.702 m)    Weight:   Wt Readings from Last 1 Encounters:  09/15/23 61.4 kg    BMI:  Body mass index is 21.2 kg/m.  Estimated Nutritional Needs:   Kcal:  1650-1850  Protein:  85-95g  Fluid:  1.8L/day  Tilda Franco, MS, RD, LDN Inpatient Clinical Dietitian Contact via Secure chat

## 2023-09-16 LAB — BASIC METABOLIC PANEL
Anion gap: 7 (ref 5–15)
BUN: 13 mg/dL (ref 6–20)
CO2: 26 mmol/L (ref 22–32)
Calcium: 8.7 mg/dL — ABNORMAL LOW (ref 8.9–10.3)
Chloride: 105 mmol/L (ref 98–111)
Creatinine, Ser: 0.59 mg/dL (ref 0.44–1.00)
GFR, Estimated: >60 mL/min (ref >60)
Glucose, Bld: 91 mg/dL (ref 70–99)
Potassium: 4.4 mmol/L (ref 3.5–5.1)
Sodium: 138 mmol/L (ref 135–145)

## 2023-09-16 LAB — CBC
HCT: 27.7 % — ABNORMAL LOW (ref 36.0–46.0)
Hemoglobin: 8.6 g/dL — ABNORMAL LOW (ref 12.0–15.0)
MCH: 30.1 pg (ref 26.0–34.0)
MCHC: 31 g/dL (ref 30.0–36.0)
MCV: 96.9 fL (ref 80.0–100.0)
Platelets: 347 10*3/uL (ref 150–400)
RBC: 2.86 MIL/uL — ABNORMAL LOW (ref 3.87–5.11)
RDW: 13.6 % (ref 11.5–15.5)
WBC: 7 10*3/uL (ref 4.0–10.5)
nRBC: 0 % (ref 0.0–0.2)

## 2023-09-16 LAB — GLUCOSE, CAPILLARY
Glucose-Capillary: 99 mg/dL (ref 70–99)
Glucose-Capillary: 77 mg/dL (ref 70–99)
Glucose-Capillary: 89 mg/dL (ref 70–99)
Glucose-Capillary: 95 mg/dL (ref 70–99)

## 2023-09-16 MED ORDER — ACETAMINOPHEN 500 MG PO TABS: 1000.0000 mg | ORAL_TABLET | Freq: Four times a day (QID) | ORAL | 2 refills | Status: DC | PRN

## 2023-09-16 MED ORDER — METHOCARBAMOL 750 MG PO TABS
750.0000 mg | ORAL_TABLET | Freq: Four times a day (QID) | ORAL | 0 refills | Status: DC | PRN
Start: 2023-09-16 — End: 2024-03-16

## 2023-09-16 MED ORDER — OSMOLITE 1.5 CAL PO LIQD: 1000.0000 mL | ORAL | 2 refills | Status: AC

## 2023-09-16 MED ORDER — ACETAMINOPHEN 325 MG PO TABS: 650.0000 mg | ORAL_TABLET | ORAL | Status: DC | PRN

## 2023-09-16 NOTE — Discharge Summary (Addendum)
Patient ID: [name] [id number] 47 y.o. Oct 10, 1975  09/14/2023  Discharge date and time: 09/16/2023  Admitting Physician: Hyman Hopes Raheem Kolbe  Discharge Physician: Hyman Hopes Andres Bantz  Admission Diagnoses: Feeding intolerance [R63.39] Patient Active Problem List   Diagnosis Date Noted   Feeding intolerance 09/14/2023   Orthostatic hypotension 08/04/2023   Upper GI bleed 07/25/2023   Abdominal pain with vomiting 01/26/2023   Depression with anxiety 01/26/2023   Enteritis 01/26/2023   Malnutrition of moderate degree (HCC) 01/20/2023   Normocytic anemia 01/20/2023   Intractable nausea and vomiting 01/18/2023   Intractable abdominal pain 01/17/2023   Pituitary microadenoma (HCC) 06/11/2022   Yeast infection 02/07/2020   History of urinary urgency 02/07/2020   Urinary frequency 02/07/2020   Dysuria 02/07/2020   Chronic migraine without aura, with intractable migraine, so stated, with status migrainosus 02/05/2020   S/P right TKA 01/23/2020   Status post total knee replacement, right 01/23/2020   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   Chronic low back pain 10/25/2019   Headache 05/04/2019   Gait instability 05/04/2019   Suicidal ideations    MDD (major depressive disorder), recurrent severe, 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   Peripheral neuropathy 03/01/2019   Right carpal tunnel syndrome 03/01/2019   Chronic constipation 01/26/2019   S/P left TKA 03/10/2018   B12 deficiency 12/01/2017   Malnutrition, calorie (HCC) 03/27/2017   Psychophysiological insomnia 03/18/2017   Paresthesia 07/22/2016   Chronic headache disorder 07/22/2016   Memory difficulty 07/22/2016   Upper GI bleeding 04/04/2016   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   Anemia, iron deficiency 10/01/2015   Left sided chronic colitis - segmental 06/11/2014   Pain of upper abdomen 04/26/2012   Asthma 04/26/2012   Reactive depression 10/02/2009   History of gastrojejunal ulcer 10/02/2009      Discharge Diagnoses:  Patient Active Problem List   Diagnosis Date Noted   Feeding intolerance 09/14/2023   Orthostatic hypotension 08/04/2023   Upper GI bleed 07/25/2023   Abdominal pain with vomiting 01/26/2023   Depression with anxiety 01/26/2023   Enteritis 01/26/2023   Malnutrition of moderate degree (HCC) 01/20/2023   Normocytic anemia 01/20/2023   Intractable nausea and vomiting 01/18/2023   Intractable abdominal pain 01/17/2023   Pituitary microadenoma (HCC) 06/11/2022   Yeast infection 02/07/2020   History of urinary urgency 02/07/2020   Urinary frequency 02/07/2020   Dysuria 02/07/2020   Chronic migraine without aura, with intractable migraine, so stated, with status migrainosus 02/05/2020   S/P right TKA 01/23/2020   Status post total knee replacement, right 01/23/2020   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   Chronic low back pain 10/25/2019   Headache 05/04/2019   Gait instability 05/04/2019   Suicidal ideations    MDD (major depressive disorder), recurrent severe, 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   Peripheral neuropathy 03/01/2019   Right carpal tunnel syndrome 03/01/2019   Chronic constipation 01/26/2019   S/P left TKA 03/10/2018   B12 deficiency 12/01/2017   Malnutrition, calorie (HCC) 03/27/2017   Psychophysiological insomnia 03/18/2017   Paresthesia 07/22/2016   Chronic headache disorder 07/22/2016   Memory difficulty 07/22/2016   Upper GI bleeding 04/04/2016   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  Hospital Course: G tube placement.  Patient needing long-term interval feedings due to weight loss, failure to thrive and feeding intolerance related to marginal ulcers after gastric bypass.   Consults: Case management, PT, OT  Significant Diagnostic Studies: UGI normal  Treatments: surgery: as above  Disposition: Home with home health for 24 hour tube feeds  Patient Instructions:  Allergies as of 09/16/2023       Reactions   Ciprofloxacin Itching, Rash   Coconut (cocos Nucifera) Anaphylaxis, Itching   Coconut Oil Anaphylaxis, Itching, Other (See Comments)   Morphine And Codeine Anaphylaxis, Hives, Other (See Comments)   Pt states that she has tolerated Norco and Dilaudid   Oxycodone Anaphylaxis   Oxycodone-acetaminophen Anaphylaxis   Doxycycline Nausea And Vomiting, Other (See Comments)   Penicillamine Hives, Other (See Comments)   Trazodone And Nefazodone Rash   Vistaril [hydroxyzine Hcl] Hives   Silicone    Penicillins Rash   Has patient had a PCN reaction causing immediate rash, facial/tongue/throat swelling, SOB or lightheadedness with hypotension: Yes Has patient had a PCN reaction causing severe rash involving mucus membranes or skin necrosis: No Has patient had a PCN reaction that required hospitalization No Has patient had a PCN reaction occurring within the last 10 years: No If all of the above answers are "NO", then may proceed with Cephalosporin use.   Tape Rash   And paper tape causes a rash if wearing for a prolong period of time        Medication List     TAKE these medications    acetaminophen 500 MG tablet Commonly known as: TYLENOL Take 2 tablets (1,000 mg total) by mouth every 6 (six) hours as needed.   albuterol 108 (90 Base) MCG/ACT inhaler Commonly known as: VENTOLIN HFA Inhale 2 puffs into the lungs every 6 (six) hours as needed for wheezing or shortness of breath.   atorvastatin 40 MG tablet Commonly known as: LIPITOR Take 40 mg  by mouth daily.   Botox 200 units injection Generic drug: botulinum toxin Type A INJECT 155 UNITS INTRAMUSCULARLY INTO HEAD AND NECK MUSCLES EVERY 3 MONTHS   buPROPion 300 MG 24 hr tablet Commonly known as: WELLBUTRIN XL Take 300 mg by mouth every morning. Take along with 150 mg=450 mg   busPIRone 15 MG tablet Commonly known as: BUSPAR Take 15 mg by mouth 3 (three) times daily.   clonazePAM 1 MG tablet Commonly known as: KLONOPIN Take 1 mg by mouth 2 (two) times daily as needed for anxiety.   cyanocobalamin 1000 MCG/ML injection Commonly known as: VITAMIN B12 Inject 1,000 mcg into the muscle 2 (two) times a week.   diclofenac Sodium 1 % Gel Commonly known as: VOLTAREN Apply 2 g topically 4 (four) times daily as needed (Pain).   dicyclomine 10 MG capsule Commonly known as: BENTYL Take 10 mg by mouth 4 (four) times daily.   DULoxetine 60 MG capsule Commonly known as: CYMBALTA Take 1 capsule (60 mg total) by mouth 2 (two) times daily.   EPINEPHrine 0.3 mg/0.3 mL Soaj injection Commonly known as: EPI-PEN Inject 0.3 mg into the muscle as needed for anaphylaxis.   famotidine 20 MG tablet Commonly known as: PEPCID Take 20 mg by mouth 2 (two) times daily.   feeding supplement (OSMOLITE 1.5 CAL) Liqd Place 1,000 mLs into feeding tube continuous.   ferrous sulfate 325 (65 FE) MG tablet Take 1 tablet (325 mg total) by mouth  daily with breakfast.   fluticasone 50 MCG/ACT nasal spray Commonly known as: FLONASE Place 2 sprays into both nostrils daily as needed for allergies or rhinitis.   gabapentin 600 MG tablet Commonly known as: NEURONTIN Take 600 mg by mouth 3 (three) times daily.   hydrocortisone 2.5 % rectal cream Commonly known as: ANUSOL-HC Place 1 Application rectally daily as needed for hemorrhoids.   HYDROmorphone 8 MG tablet Commonly known as: DILAUDID Take 0.5 tablets (4 mg total) by mouth every 3 (three) hours.   linaclotide 290 MCG Caps  capsule Commonly known as: LINZESS Take 290 mcg by mouth daily before breakfast.   magnesium oxide 400 (240 Mg) MG tablet Commonly known as: MAG-OX Take 400 mg by mouth daily.   meclizine 25 MG tablet Commonly known as: ANTIVERT Take 25 mg by mouth 3 (three) times daily.   mesalamine 1.2 g EC tablet Commonly known as: LIALDA Take 2.4 g by mouth daily with breakfast.   methocarbamol 750 MG tablet Commonly known as: Robaxin-750 Take 1 tablet (750 mg total) by mouth every 6 (six) hours as needed for muscle spasms.   midodrine 5 MG tablet Commonly known as: PROAMATINE Take 15 mg by mouth 3 (three) times daily with meals.   multivitamin with minerals Tabs tablet Take 1 tablet by mouth daily.   Narcan 4 MG/0.1ML Liqd nasal spray kit Generic drug: naloxone Place 1 spray into the nose daily as needed (overdose).   omeprazole 2 mg/mL Susp oral suspension Commonly known as: KONVOMEP Take 10 mLs (20 mg total) by mouth 2 (two) times daily before a meal.   ondansetron 8 MG tablet Commonly known as: ZOFRAN Take 8 mg by mouth every 8 (eight) hours as needed for nausea or vomiting.   oxybutynin 5 MG tablet Commonly known as: DITROPAN Take 10 mg by mouth at bedtime.   Qulipta 30 MG Tabs Generic drug: Atogepant Take 1 tablet (30 mg total) by mouth daily.   sucralfate 1 g tablet Commonly known as: CARAFATE Take 1 tablet (1 g total) by mouth every 6 (six) hours. What changed: when to take this   SUMAtriptan 50 MG tablet Commonly known as: Imitrex Take 1 tablet (50 mg total) by mouth every 2 (two) hours as needed for migraine. May repeat in 2 hours if headache persists or recurs.   Symproic 0.2 MG Tabs Generic drug: Naldemedine Tosylate Take 0.2 mg by mouth daily.   thiamine 100 MG tablet Commonly known as: Vitamin B-1 Take 100 mg by mouth daily at 12 noon.   torsemide 5 MG tablet Commonly known as: DEMADEX Take 5 mg by mouth daily.   valACYclovir 1000 MG  tablet Commonly known as: VALTREX Take 1,000 mg by mouth 2 (two) times daily as needed.   Vitamin D3 1.25 MG (50000 UT) Caps Take 50,000 Units by mouth every 7 (seven) days.        Activity: no heavy lifting for 4 weeks Diet:  Bariatric advanced diet , tube feeds Wound Care: keep wound clean and dry  Follow-up:  With Dr. Dossie Der in 4 weeks.  Signed: Hyman Hopes Annemarie Sebree [facility] Bariat[facility] Minimally Invasive Surgery [facility], [place]   09/16/2023, 8:46 AM

## 2023-09-16 NOTE — TOC Progression Note (Addendum)
Transition of Care [facility]) - Progression Note    Patient Details  Name: [name] MRN: [id number] Date of Birth: 09-11-1976  [facility]) CM/SW Contact  Howell Rucks, RN Phone Number: 09/16/2023, 11:58 AM  Clinical Narrative:  Met with pt at bedside to provide resources for rent assistance ( provided [employer] and [employer]). Pt played vm from Joely with [employer] , [phone], requesting call back to get more information on order she received. Information sent via teams chat to Memory Argue, SW for follow up.   Expected Discharge Plan: Home w Home Health Services Barriers to Discharge: Continued Medical Work up  Expected Discharge Plan and Services   Discharge Planning Services: CM Consult   Living arrangements for the past 2 months: Apartment Expected Discharge Date: 09/16/23                           [facility] Agency: [employer] Date HH Agency Contacted: 09/15/23 Time HH Agency Contacted: [phone] Representative spoke with at [facility] Agency: Pam   Social Determinants of Health (SDOH) Interventions SDOH Screenings   Food Insecurity: Food Insecurity Present (09/14/2023)  Housing: Unknown (09/14/2023)  Transportation Needs: No Transportation Needs (09/14/2023)  Utilities: At Risk (09/14/2023)  Alcohol Screen: Medium Risk (04/08/2019)  Depression (PHQ2-9): High Risk (08/25/2023)  Financial Resource Strain: Low Risk  (04/08/2019)  Physical Activity: Inactive (04/08/2019)  Social Connections: Unknown (01/29/2022)   [facility], [facility]  Stress: Stress Concern Present (04/08/2019)  Tobacco Use: Medium Risk (09/14/2023)    Readmission Risk Interventions    09/15/2023   11:43 AM 07/30/2023   11:59 AM  Readmission Risk Prevention Plan  Transportation Screening Complete Complete  PCP or Specialist Appt within 5-7 Days  Complete  PCP or Specialist Appt within 3-5 Days Complete   Home Care Screening  Complete   Medication Review (RN CM)  Complete  HRI or Home Care Consult Complete   Social Work Consult for Recovery Care Planning/Counseling Complete   Palliative Care Screening Not Applicable   Medication Review Oceanographer) Complete

## 2023-09-16 NOTE — TOC Transition Note (Signed)
Transition of Care [facility]) - Discharge Note  Patient Details  Name: [name] MRN: [id number] Date of Birth: [birth year]  Transition of Care [facility]) CM/SW Contact:  Ewing Schlein, LCSW Phone Number: 09/16/2023, 5:21 PM  Clinical Narrative: CSW confirmed with patient and Pam with [employer] that patient will discharge home with tube feedings through [employer]. Patient is requested [facility]. CSW made referral for [facility] to Cindie with [facility], which was accepted but the [facility] will be next week. HH and tube feeding orders placed. Pam to complete teaching today. TOC signing off.  Final next level of care: Home w Home Health Services Barriers to Discharge: Barriers Resolved  Patient Goals and CMS Choice Patient states their goals for this hospitalization and ongoing recovery are:: return home with support from family CMS Medicare.gov Compare Post Acute Care list provided to:: Patient Choice offered to / list presented to : Patient [place] ownership interest in [facility].provided to:: Patient   Discharge Plan and Services Additional resources added to the After Visit Summary for   Discharge Planning Services: CM Consult DME Arranged: Tube feeding DME Agency: Other - Comment Julianne Rice) Date DME Agency Contacted: 09/15/23 Representative spoke with at DME Agency: Elita Quick HH Arranged: RN HH Agency: [facility] Date [facility] Agency Contacted: 09/16/23 Time HH Agency Contacted: [phone] Representative spoke with at [facility] Agency: Cindie  Social Drivers of Health (SDOH) Interventions SDOH Screenings   Food Insecurity: Food Insecurity Present (09/14/2023)  Housing: Unknown (09/14/2023)  Transportation Needs: No Transportation Needs (09/14/2023)  Utilities: At Risk (09/14/2023)  Alcohol Screen: Medium Risk (04/08/2019)  Depression (PHQ2-9): High Risk (08/25/2023)  Financial Resource Strain: Low Risk  (04/08/2019)  Physical Activity: Inactive (04/08/2019)  Social Connections: Unknown (01/29/2022)    Received from [facility], [facility]  Stress: Stress Concern Present (04/08/2019)  Tobacco Use: Medium Risk (09/14/2023)   Readmission Risk Interventions    09/15/2023   11:43 AM 07/30/2023   11:59 AM  Readmission Risk Prevention Plan  Transportation Screening Complete Complete  PCP or Specialist Appt within 5-7 Days  Complete  PCP or Specialist Appt within 3-5 Days Complete   Home Care Screening  Complete  Medication Review (RN CM)  Complete  HRI or Home Care Consult Complete   Social Work Consult for Recovery Care Planning/Counseling Complete   Palliative Care Screening Not Applicable   Medication Review Oceanographer) Complete

## 2023-09-16 NOTE — Plan of Care (Signed)

## 2023-09-23 ENCOUNTER — Telehealth: Payer: Self-pay | Admitting: Dietician

## 2023-09-23 NOTE — Telephone Encounter (Signed)
Called to follow-up on patient post tube feed placement. No answer. Left voice message with call back number.

## 2023-09-30 ENCOUNTER — Other Ambulatory Visit: Payer: Self-pay

## 2023-09-30 ENCOUNTER — Inpatient Hospital Stay (HOSPITAL_COMMUNITY)
Admission: EM | Admit: 2023-09-30 | Discharge: 2023-10-06 | DRG: 378 | Disposition: A | Discharge status: Home Health | Payer: Medicare Other | Attending: Internal Medicine | Admitting: Internal Medicine

## 2023-09-30 ENCOUNTER — Encounter (HOSPITAL_COMMUNITY): Payer: Self-pay | Admitting: Emergency Medicine

## 2023-09-30 DIAGNOSIS — Z885 Allergy status to narcotic agent status: Secondary | ICD-10-CM

## 2023-09-30 DIAGNOSIS — Z8249 Family history of ischemic heart disease and other diseases of the circulatory system: Secondary | ICD-10-CM

## 2023-09-30 DIAGNOSIS — G43909 Migraine, unspecified, not intractable, without status migrainosus: Secondary | ICD-10-CM | POA: Diagnosis present

## 2023-09-30 DIAGNOSIS — Z800 Family history of malignant neoplasm of digestive organs: Secondary | ICD-10-CM

## 2023-09-30 DIAGNOSIS — Z91048 Other nonmedicinal substance allergy status: Secondary | ICD-10-CM

## 2023-09-30 DIAGNOSIS — Z96652 Presence of left artificial knee joint: Secondary | ICD-10-CM

## 2023-09-30 DIAGNOSIS — G629 Polyneuropathy, unspecified: Secondary | ICD-10-CM

## 2023-09-30 DIAGNOSIS — Z888 Allergy status to other drugs, medicaments and biological substances status: Secondary | ICD-10-CM

## 2023-09-30 DIAGNOSIS — K922 Gastrointestinal hemorrhage, unspecified: Secondary | ICD-10-CM | POA: Diagnosis present

## 2023-09-30 DIAGNOSIS — K284 Chronic or unspecified gastrojejunal ulcer with hemorrhage: Secondary | ICD-10-CM | POA: Diagnosis not present

## 2023-09-30 DIAGNOSIS — E538 Deficiency of other specified B group vitamins: Secondary | ICD-10-CM | POA: Diagnosis present

## 2023-09-30 DIAGNOSIS — K920 Hematemesis: Secondary | ICD-10-CM | POA: Diagnosis not present

## 2023-09-30 DIAGNOSIS — Z931 Gastrostomy status: Secondary | ICD-10-CM

## 2023-09-30 DIAGNOSIS — J45909 Unspecified asthma, uncomplicated: Secondary | ICD-10-CM | POA: Diagnosis present

## 2023-09-30 DIAGNOSIS — E785 Hyperlipidemia, unspecified: Secondary | ICD-10-CM | POA: Diagnosis present

## 2023-09-30 DIAGNOSIS — Z9884 Bariatric surgery status: Secondary | ICD-10-CM

## 2023-09-30 DIAGNOSIS — D509 Iron deficiency anemia, unspecified: Secondary | ICD-10-CM | POA: Diagnosis present

## 2023-09-30 DIAGNOSIS — Z79899 Other long term (current) drug therapy: Secondary | ICD-10-CM

## 2023-09-30 DIAGNOSIS — Z87440 Personal history of urinary (tract) infections: Secondary | ICD-10-CM

## 2023-09-30 DIAGNOSIS — Z91018 Allergy to other foods: Secondary | ICD-10-CM

## 2023-09-30 DIAGNOSIS — Z880 Allergy status to penicillin: Secondary | ICD-10-CM

## 2023-09-30 DIAGNOSIS — F32A Depression, unspecified: Secondary | ICD-10-CM | POA: Diagnosis present

## 2023-09-30 DIAGNOSIS — Z96653 Presence of artificial knee joint, bilateral: Secondary | ICD-10-CM | POA: Diagnosis present

## 2023-09-30 DIAGNOSIS — Z803 Family history of malignant neoplasm of breast: Secondary | ICD-10-CM

## 2023-09-30 DIAGNOSIS — D62 Acute posthemorrhagic anemia: Secondary | ICD-10-CM | POA: Diagnosis present

## 2023-09-30 DIAGNOSIS — F418 Other specified anxiety disorders: Secondary | ICD-10-CM | POA: Diagnosis present

## 2023-09-30 DIAGNOSIS — Z90710 Acquired absence of both cervix and uterus: Secondary | ICD-10-CM

## 2023-09-30 DIAGNOSIS — E441 Mild protein-calorie malnutrition: Secondary | ICD-10-CM | POA: Diagnosis present

## 2023-09-30 DIAGNOSIS — Z881 Allergy status to other antibiotic agents status: Secondary | ICD-10-CM

## 2023-09-30 DIAGNOSIS — Z833 Family history of diabetes mellitus: Secondary | ICD-10-CM

## 2023-09-30 DIAGNOSIS — F419 Anxiety disorder, unspecified: Secondary | ICD-10-CM | POA: Diagnosis present

## 2023-09-30 DIAGNOSIS — G894 Chronic pain syndrome: Secondary | ICD-10-CM | POA: Diagnosis present

## 2023-09-30 DIAGNOSIS — E876 Hypokalemia: Secondary | ICD-10-CM | POA: Diagnosis present

## 2023-09-30 DIAGNOSIS — Z808 Family history of malignant neoplasm of other organs or systems: Secondary | ICD-10-CM

## 2023-09-30 DIAGNOSIS — Z903 Acquired absence of stomach [part of]: Secondary | ICD-10-CM

## 2023-09-30 DIAGNOSIS — K5900 Constipation, unspecified: Secondary | ICD-10-CM | POA: Diagnosis present

## 2023-09-30 DIAGNOSIS — Z87891 Personal history of nicotine dependence: Secondary | ICD-10-CM

## 2023-09-30 DIAGNOSIS — K5909 Other constipation: Secondary | ICD-10-CM | POA: Diagnosis present

## 2023-09-30 DIAGNOSIS — K219 Gastro-esophageal reflux disease without esophagitis: Secondary | ICD-10-CM | POA: Diagnosis present

## 2023-09-30 DIAGNOSIS — Z825 Family history of asthma and other chronic lower respiratory diseases: Secondary | ICD-10-CM

## 2023-09-30 DIAGNOSIS — Z6820 Body mass index (BMI) 20.0-20.9, adult: Secondary | ICD-10-CM

## 2023-09-30 MED ORDER — ONDANSETRON HCL 4 MG/2ML IJ SOLN
4.0000 mg | INTRAMUSCULAR | Status: AC
  Administered 2023-09-30: 4 mg via INTRAMUSCULAR
  Filled 2023-09-30: qty 2

## 2023-09-30 NOTE — ED Provider Triage Note (Signed)
 Emergency Medicine Provider Triage Evaluation Note  [name] , a 48 y.o. female  was evaluated in triage.  Pt complains of hematemesis and fall.  Patient reports hematemesis for the last 1 week.  States that she has vomited up a very large amount of blood.  Reports recently getting a feeding tube secondary to peptic ulcer disease.  Reports she was recently admitted secondary to hematemesis as well.  At that time found to have PUD.  Patient states that she feels lightheaded, dizzy.  Reports that she had syncopal episode yesterday where she hit her head on the bathroom tile.  Unsure how long she lost consciousness for.  Endorsing abdominal pain, nausea and vomiting.  Denies any diarrhea or blood in her stool.  Review of Systems  Positive:  Negative:   Physical Exam  BP 128/80 (BP Location: Left Arm)   Pulse (!) 101   Temp 97.6 F (36.4 C) (Oral)   Resp 17   LMP 06/02/2017 Comment: hysterectomy 06/08/2017  SpO2 100%  Gen:   Awake, no distress   Resp:  Normal effort  MSK:   Moves extremities without difficulty  Other:  Left lower quadrant abdominal tenderness  Medical Decision Making  Medically screening exam initiated at 11:02 PM.  Appropriate orders placed.  TAYLINN BRABANT was informed that the remainder of the evaluation will be completed by another provider, this initial triage assessment does not replace that evaluation, and the importance of remaining in the ED until their evaluation is complete.     Ruthell Lonni FALCON, PA-C 09/30/23 [id number]

## 2023-09-30 NOTE — ED Triage Notes (Signed)
 Patient c/o hematemesis x 1 week. Patient report worsening symptoms and falling in the bathroom today.  Patient  report she had her feeding tube done 2 weeks ago. Patient denies fever.

## 2023-10-01 ENCOUNTER — Emergency Department (HOSPITAL_COMMUNITY): Payer: Medicare Other

## 2023-10-01 DIAGNOSIS — Z931 Gastrostomy status: Secondary | ICD-10-CM | POA: Diagnosis not present

## 2023-10-01 DIAGNOSIS — Z833 Family history of diabetes mellitus: Secondary | ICD-10-CM | POA: Diagnosis not present

## 2023-10-01 DIAGNOSIS — Z9884 Bariatric surgery status: Secondary | ICD-10-CM | POA: Diagnosis not present

## 2023-10-01 DIAGNOSIS — Z6820 Body mass index (BMI) 20.0-20.9, adult: Secondary | ICD-10-CM | POA: Diagnosis not present

## 2023-10-01 DIAGNOSIS — Z87891 Personal history of nicotine dependence: Secondary | ICD-10-CM | POA: Diagnosis not present

## 2023-10-01 DIAGNOSIS — K5909 Other constipation: Secondary | ICD-10-CM | POA: Diagnosis present

## 2023-10-01 DIAGNOSIS — F419 Anxiety disorder, unspecified: Secondary | ICD-10-CM | POA: Diagnosis present

## 2023-10-01 DIAGNOSIS — E538 Deficiency of other specified B group vitamins: Secondary | ICD-10-CM | POA: Diagnosis not present

## 2023-10-01 DIAGNOSIS — K219 Gastro-esophageal reflux disease without esophagitis: Secondary | ICD-10-CM | POA: Diagnosis present

## 2023-10-01 DIAGNOSIS — K922 Gastrointestinal hemorrhage, unspecified: Secondary | ICD-10-CM

## 2023-10-01 DIAGNOSIS — Z96653 Presence of artificial knee joint, bilateral: Secondary | ICD-10-CM | POA: Diagnosis present

## 2023-10-01 DIAGNOSIS — E785 Hyperlipidemia, unspecified: Secondary | ICD-10-CM | POA: Diagnosis present

## 2023-10-01 DIAGNOSIS — D62 Acute posthemorrhagic anemia: Secondary | ICD-10-CM | POA: Diagnosis present

## 2023-10-01 DIAGNOSIS — E441 Mild protein-calorie malnutrition: Secondary | ICD-10-CM | POA: Diagnosis present

## 2023-10-01 DIAGNOSIS — E876 Hypokalemia: Secondary | ICD-10-CM | POA: Diagnosis present

## 2023-10-01 DIAGNOSIS — F418 Other specified anxiety disorders: Secondary | ICD-10-CM | POA: Diagnosis not present

## 2023-10-01 DIAGNOSIS — Z881 Allergy status to other antibiotic agents status: Secondary | ICD-10-CM | POA: Diagnosis not present

## 2023-10-01 DIAGNOSIS — K920 Hematemesis: Secondary | ICD-10-CM | POA: Diagnosis present

## 2023-10-01 DIAGNOSIS — Z8249 Family history of ischemic heart disease and other diseases of the circulatory system: Secondary | ICD-10-CM | POA: Diagnosis not present

## 2023-10-01 DIAGNOSIS — Z880 Allergy status to penicillin: Secondary | ICD-10-CM | POA: Diagnosis not present

## 2023-10-01 DIAGNOSIS — Z79899 Other long term (current) drug therapy: Secondary | ICD-10-CM | POA: Diagnosis not present

## 2023-10-01 DIAGNOSIS — G894 Chronic pain syndrome: Secondary | ICD-10-CM | POA: Diagnosis present

## 2023-10-01 DIAGNOSIS — Z825 Family history of asthma and other chronic lower respiratory diseases: Secondary | ICD-10-CM | POA: Diagnosis not present

## 2023-10-01 DIAGNOSIS — K284 Chronic or unspecified gastrojejunal ulcer with hemorrhage: Secondary | ICD-10-CM | POA: Diagnosis not present

## 2023-10-01 DIAGNOSIS — F32A Depression, unspecified: Secondary | ICD-10-CM | POA: Diagnosis present

## 2023-10-01 DIAGNOSIS — D509 Iron deficiency anemia, unspecified: Secondary | ICD-10-CM | POA: Diagnosis present

## 2023-10-01 DIAGNOSIS — J45909 Unspecified asthma, uncomplicated: Secondary | ICD-10-CM | POA: Diagnosis not present

## 2023-10-01 LAB — COMPREHENSIVE METABOLIC PANEL
ALT: 16 U/L (ref 0–44)
ALT: 14 U/L (ref 0–44)
AST: 20 U/L (ref 15–41)
AST: 17 U/L (ref 15–41)
Albumin: 3.7 g/dL (ref 3.5–5.0)
Albumin: 3 g/dL — ABNORMAL LOW (ref 3.5–5.0)
Alkaline Phosphatase: 48 U/L (ref 38–126)
Alkaline Phosphatase: 38 U/L (ref 38–126)
Anion gap: 7 (ref 5–15)
Anion gap: 4 — ABNORMAL LOW (ref 5–15)
BUN: 23 mg/dL — ABNORMAL HIGH (ref 6–20)
BUN: 21 mg/dL — ABNORMAL HIGH (ref 6–20)
CO2: 23 mmol/L (ref 22–32)
CO2: 25 mmol/L (ref 22–32)
Calcium: 9.2 mg/dL (ref 8.9–10.3)
Calcium: 8.3 mg/dL — ABNORMAL LOW (ref 8.9–10.3)
Chloride: 105 mmol/L (ref 98–111)
Chloride: 109 mmol/L (ref 98–111)
Creatinine, Ser: 0.73 mg/dL (ref 0.44–1.00)
Creatinine, Ser: 0.43 mg/dL — ABNORMAL LOW (ref 0.44–1.00)
GFR, Estimated: >60 mL/min (ref >60)
GFR, Estimated: >60 mL/min (ref >60)
Glucose, Bld: 125 mg/dL — ABNORMAL HIGH (ref 70–99)
Glucose, Bld: 101 mg/dL — ABNORMAL HIGH (ref 70–99)
Potassium: 3.6 mmol/L (ref 3.5–5.1)
Potassium: 3.4 mmol/L — ABNORMAL LOW (ref 3.5–5.1)
Sodium: 135 mmol/L (ref 135–145)
Sodium: 138 mmol/L (ref 135–145)
Total Bilirubin: 0.3 mg/dL (ref 0.0–1.2)
Total Bilirubin: 0.3 mg/dL (ref 0.0–1.2)
Total Protein: 7.1 g/dL (ref 6.5–8.1)
Total Protein: 5.6 g/dL — ABNORMAL LOW (ref 6.5–8.1)

## 2023-10-01 LAB — CBC WITH DIFFERENTIAL/PLATELET
Abs Immature Granulocytes: 0.03 10*3/uL (ref 0.00–0.07)
Basophils Absolute: 0 10*3/uL (ref 0.0–0.1)
Basophils Relative: 0 %
Eosinophils Absolute: 0.2 10*3/uL (ref 0.0–0.5)
Eosinophils Relative: 2 %
HCT: 16.9 % — ABNORMAL LOW (ref 36.0–46.0)
Hemoglobin: 5.4 g/dL — CL (ref 12.0–15.0)
Immature Granulocytes: 0 %
Lymphocytes Relative: 32 %
Lymphs Abs: 2.9 10*3/uL (ref 0.7–4.0)
MCH: 30.7 pg (ref 26.0–34.0)
MCHC: 32 g/dL (ref 30.0–36.0)
MCV: 96 fL (ref 80.0–100.0)
Monocytes Absolute: 0.6 10*3/uL (ref 0.1–1.0)
Monocytes Relative: 7 %
Neutro Abs: 5.4 10*3/uL (ref 1.7–7.7)
Neutrophils Relative %: 59 %
Platelets: 306 10*3/uL (ref 150–400)
RBC: 1.76 MIL/uL — ABNORMAL LOW (ref 3.87–5.11)
RDW: 14.1 % (ref 11.5–15.5)
WBC: 9.1 10*3/uL (ref 4.0–10.5)
nRBC: 0 % (ref 0.0–0.2)

## 2023-10-01 LAB — MRSA NEXT GEN BY PCR, NASAL: MRSA by PCR Next Gen: NOT DETECTED

## 2023-10-01 LAB — PREPARE RBC (CROSSMATCH)

## 2023-10-01 LAB — CBC
HCT: 18.1 % — ABNORMAL LOW (ref 36.0–46.0)
Hemoglobin: 5.6 g/dL — CL (ref 12.0–15.0)
MCH: 29.6 pg (ref 26.0–34.0)
MCHC: 30.9 g/dL (ref 30.0–36.0)
MCV: 95.8 fL (ref 80.0–100.0)
Platelets: 442 10*3/uL — ABNORMAL HIGH (ref 150–400)
RBC: 1.89 MIL/uL — ABNORMAL LOW (ref 3.87–5.11)
RDW: 14.2 % (ref 11.5–15.5)
WBC: 14.2 10*3/uL — ABNORMAL HIGH (ref 4.0–10.5)
nRBC: 0 % (ref 0.0–0.2)

## 2023-10-01 LAB — MAGNESIUM: Magnesium: 2.2 mg/dL (ref 1.7–2.4)

## 2023-10-01 LAB — I-STAT CG4 LACTIC ACID, ED: Lactic Acid, Venous: 1.4 mmol/L (ref 0.5–1.9)

## 2023-10-01 LAB — HEMOGLOBIN AND HEMATOCRIT, BLOOD
HCT: 19.2 % — ABNORMAL LOW (ref 36.0–46.0)
Hemoglobin: 6.3 g/dL — CL (ref 12.0–15.0)

## 2023-10-01 LAB — LIPASE, BLOOD: Lipase: 24 U/L (ref 11–51)

## 2023-10-01 LAB — POC OCCULT BLOOD, ED: Fecal Occult Bld: NEGATIVE

## 2023-10-01 MED ORDER — NALOXONE HCL 0.4 MG/ML IJ SOLN: 0.4000 mg | INTRAMUSCULAR | Status: DC | PRN

## 2023-10-01 MED ORDER — BUPROPION HCL ER (XL) 300 MG PO TB24
450.0000 mg | ORAL_TABLET | Freq: Every morning | ORAL | Status: DC
  Administered 2023-10-02 – 2023-10-06 (×5): 450 mg via ORAL
  Filled 2023-10-01 (×5): qty 1

## 2023-10-01 MED ORDER — DICYCLOMINE HCL 10 MG PO CAPS
10.0000 mg | ORAL_CAPSULE | Freq: Three times a day (TID) | ORAL | Status: AC
  Administered 2023-10-01 – 2023-10-03 (×7): 10 mg via ORAL
  Filled 2023-10-01 (×7): qty 1

## 2023-10-01 MED ORDER — ONDANSETRON HCL 4 MG/2ML IJ SOLN
4.0000 mg | Freq: Four times a day (QID) | INTRAMUSCULAR | Status: DC | PRN
  Administered 2023-10-01 – 2023-10-05 (×7): 4 mg via INTRAVENOUS
  Filled 2023-10-01 (×7): qty 2

## 2023-10-01 MED ORDER — SODIUM CHLORIDE 0.9 % IV BOLUS (SEPSIS)
1000.0000 mL | Freq: Once | INTRAVENOUS | Status: AC
Start: 2023-10-01 — End: 2023-10-01
  Administered 2023-10-01: 1000 mL via INTRAVENOUS

## 2023-10-01 MED ORDER — BUSPIRONE HCL 5 MG PO TABS
15.0000 mg | ORAL_TABLET | Freq: Three times a day (TID) | ORAL | Status: DC
  Administered 2023-10-01 – 2023-10-06 (×14): 15 mg via ORAL
  Filled 2023-10-01 (×14): qty 1

## 2023-10-01 MED ORDER — ATOGEPANT 30 MG PO TABS
30.0000 mg | ORAL_TABLET | Freq: Every day | ORAL | Status: DC
  Administered 2023-10-02 – 2023-10-06 (×5): 30 mg via ORAL
  Filled 2023-10-01 (×4): qty 1

## 2023-10-01 MED ORDER — FAMOTIDINE 20 MG PO TABS
20.0000 mg | ORAL_TABLET | Freq: Two times a day (BID) | ORAL | Status: DC
  Administered 2023-10-01 – 2023-10-06 (×10): 20 mg via ORAL
  Filled 2023-10-01 (×10): qty 1

## 2023-10-01 MED ORDER — MESALAMINE 1.2 G PO TBEC
2.4000 g | DELAYED_RELEASE_TABLET | Freq: Every day | ORAL | Status: DC
  Administered 2023-10-02 – 2023-10-06 (×5): 2.4 g via ORAL
  Filled 2023-10-01 (×6): qty 2

## 2023-10-01 MED ORDER — SODIUM CHLORIDE 0.9% IV SOLUTION: Freq: Once | INTRAVENOUS | Status: AC

## 2023-10-01 MED ORDER — SUMATRIPTAN SUCCINATE 50 MG PO TABS
50.0000 mg | ORAL_TABLET | ORAL | Status: DC | PRN
  Administered 2023-10-01: 50 mg via ORAL
  Filled 2023-10-01 (×2): qty 1

## 2023-10-01 MED ORDER — BUPRENORPHINE HCL 75 MCG BU FILM
75.0000 ug | ORAL_FILM | Freq: Two times a day (BID) | BUCCAL | Status: DC
Start: 2023-10-02 — End: 2023-10-06
  Administered 2023-10-02: 75 ug via SUBLINGUAL
  Filled 2023-10-01 (×2): qty 1

## 2023-10-01 MED ORDER — GABAPENTIN 300 MG PO CAPS
600.0000 mg | ORAL_CAPSULE | Freq: Three times a day (TID) | ORAL | Status: DC
  Administered 2023-10-01 – 2023-10-06 (×16): 600 mg via ORAL
  Filled 2023-10-01 (×16): qty 2

## 2023-10-01 MED ORDER — MAGNESIUM OXIDE -MG SUPPLEMENT 400 (240 MG) MG PO TABS
400.0000 mg | ORAL_TABLET | Freq: Every day | ORAL | Status: DC
  Administered 2023-10-01 – 2023-10-06 (×6): 400 mg via ORAL
  Filled 2023-10-01 (×6): qty 1

## 2023-10-01 MED ORDER — SUMATRIPTAN SUCCINATE 50 MG PO TABS: 50.0000 mg | ORAL_TABLET | ORAL | Status: DC | PRN

## 2023-10-01 MED ORDER — SUCRALFATE 1 G PO TABS
1.0000 g | ORAL_TABLET | Freq: Four times a day (QID) | ORAL | Status: DC
  Administered 2023-10-01 – 2023-10-06 (×20): 1 g via ORAL
  Filled 2023-10-01 (×21): qty 1

## 2023-10-01 MED ORDER — ATORVASTATIN CALCIUM 40 MG PO TABS
40.0000 mg | ORAL_TABLET | Freq: Every day | ORAL | Status: DC
  Administered 2023-10-01 – 2023-10-05 (×5): 40 mg via ORAL
  Filled 2023-10-01 (×5): qty 1

## 2023-10-01 MED ORDER — FENTANYL CITRATE PF 50 MCG/ML IJ SOSY
25.0000 ug | PREFILLED_SYRINGE | INTRAMUSCULAR | Status: DC | PRN
  Administered 2023-10-01 – 2023-10-04 (×4): 25 ug via INTRAVENOUS
  Filled 2023-10-01 (×4): qty 1

## 2023-10-01 MED ORDER — ORAL CARE MOUTH RINSE: 15.0000 mL | OROMUCOSAL | Status: DC | PRN

## 2023-10-01 MED ORDER — HYDROMORPHONE HCL 2 MG PO TABS
4.0000 mg | ORAL_TABLET | ORAL | Status: DC
  Administered 2023-10-01 – 2023-10-06 (×38): 4 mg via ORAL
  Filled 2023-10-01 (×39): qty 2

## 2023-10-01 MED ORDER — METHOCARBAMOL 500 MG PO TABS
750.0000 mg | ORAL_TABLET | Freq: Four times a day (QID) | ORAL | Status: DC | PRN
  Administered 2023-10-05 – 2023-10-06 (×3): 750 mg via ORAL
  Filled 2023-10-01 (×3): qty 2

## 2023-10-01 MED ORDER — IOHEXOL 300 MG/ML  SOLN
100.0000 mL | Freq: Once | INTRAMUSCULAR | Status: AC | PRN
  Administered 2023-10-01: 100 mL via INTRAVENOUS

## 2023-10-01 MED ORDER — FENTANYL CITRATE PF 50 MCG/ML IJ SOSY
50.0000 ug | PREFILLED_SYRINGE | Freq: Once | INTRAMUSCULAR | Status: AC
  Administered 2023-10-01: 50 ug via INTRAVENOUS
  Filled 2023-10-01: qty 1

## 2023-10-01 MED ORDER — THIAMINE MONONITRATE 100 MG PO TABS
100.0000 mg | ORAL_TABLET | Freq: Every day | ORAL | Status: DC
  Administered 2023-10-01 – 2023-10-06 (×6): 100 mg via ORAL
  Filled 2023-10-01 (×6): qty 1

## 2023-10-01 MED ORDER — CLONAZEPAM 1 MG PO TABS
1.0000 mg | ORAL_TABLET | Freq: Two times a day (BID) | ORAL | Status: DC | PRN
  Administered 2023-10-01 – 2023-10-05 (×7): 1 mg via ORAL
  Filled 2023-10-01: qty 2
  Filled 2023-10-01 (×5): qty 1

## 2023-10-01 MED ORDER — DULOXETINE HCL 30 MG PO CPEP
60.0000 mg | ORAL_CAPSULE | Freq: Two times a day (BID) | ORAL | Status: DC
  Administered 2023-10-01 – 2023-10-06 (×10): 60 mg via ORAL
  Filled 2023-10-01 (×10): qty 2

## 2023-10-01 MED ORDER — PANTOPRAZOLE SODIUM 40 MG IV SOLR: 40.0000 mg | INTRAVENOUS | Status: AC

## 2023-10-01 MED ORDER — CHLORHEXIDINE GLUCONATE CLOTH 2 % EX PADS
6.0000 | MEDICATED_PAD | Freq: Every day | CUTANEOUS | Status: DC
  Administered 2023-10-01 – 2023-10-06 (×6): 6 via TOPICAL

## 2023-10-01 MED ORDER — PANTOPRAZOLE SODIUM 40 MG IV SOLR
40.0000 mg | Freq: Two times a day (BID) | INTRAVENOUS | Status: DC
  Administered 2023-10-01 – 2023-10-06 (×11): 40 mg via INTRAVENOUS
  Filled 2023-10-01 (×11): qty 10

## 2023-10-01 MED ORDER — MIDODRINE HCL 5 MG PO TABS
15.0000 mg | ORAL_TABLET | Freq: Three times a day (TID) | ORAL | Status: DC
  Administered 2023-10-01 – 2023-10-06 (×15): 15 mg via ORAL
  Filled 2023-10-01 (×15): qty 3

## 2023-10-01 MED ORDER — LINACLOTIDE 145 MCG PO CAPS
290.0000 ug | ORAL_CAPSULE | Freq: Every day | ORAL | Status: DC
  Administered 2023-10-02 – 2023-10-06 (×5): 290 ug via ORAL
  Filled 2023-10-01 (×6): qty 2

## 2023-10-01 NOTE — ED Provider Notes (Signed)
 [place] EMERGENCY DEPARTMENT AT [facility] Provider Note   CSN: [id number] Arrival date & time: 09/30/23  2221     History  Chief Complaint  Patient presents with   Hematemesis   Fall    [name] is a 48 y.o. female.  The history is provided by the patient.  Patient with extensive history presents with multiple complaints Patient reports over the past several days she has had multiple episodes of bloody emesis.  She is also had black emesis as well.  She has had some dark stools.  She reports generalized weakness and fatigue and has had syncopal episodes when she struck her head Patient had a laparoscopic gastrostomy tube placement around December 17.  No immediate complications after the procedure, but is now having increasing pain in the hematemesis Patient is not on anticoagulation    Home Medications Prior to Admission medications   Medication Sig Start Date End Date Taking? Authorizing Provider  acetaminophen  (TYLENOL ) 500 MG tablet Take 2 tablets (1,000 mg total) by mouth every 6 (six) hours as needed. 09/16/23 09/15/24  Stechschulte, Deward PARAS, MD  albuterol  (VENTOLIN  HFA) 108 (90 Base) MCG/ACT inhaler Inhale 2 puffs into the lungs every 6 (six) hours as needed for wheezing or shortness of breath.    [provider]  Atogepant  (QULIPTA ) 30 MG TABS Take 1 tablet (30 mg total) by mouth daily. 05/05/23   Gayland Lauraine PARAS, NP  atorvastatin  (LIPITOR) 40 MG tablet Take 40 mg by mouth daily. 12/10/22   [provider]  botulinum toxin Type A  (BOTOX ) 200 units injection INJECT 155 UNITS INTRAMUSCULARLY INTO HEAD AND NECK MUSCLES EVERY 3 MONTHS 11/23/22   Onita Duos, MD  buPROPion  (WELLBUTRIN  XL) 300 MG 24 hr tablet Take 300 mg by mouth every morning. Take along with 150 mg=450 mg 06/05/20   [provider]  busPIRone  (BUSPAR ) 15 MG tablet Take 15 mg by mouth 3 (three) times daily.    [provider]  Cholecalciferol (VITAMIN D3) 1.25  MG (50000 UT) CAPS Take 50,000 Units by mouth every 7 (seven) days.  04/03/19   [provider]  clonazePAM  (KLONOPIN ) 1 MG tablet Take 1 mg by mouth 2 (two) times daily as needed for anxiety.    [provider]  cyanocobalamin  (,VITAMIN B-12,) 1000 MCG/ML injection Inject 1,000 mcg into the muscle 2 (two) times a week.  04/06/19   [provider]  diclofenac  Sodium (VOLTAREN ) 1 % GEL Apply 2 g topically 4 (four) times daily as needed (Pain).    [provider]  dicyclomine  (BENTYL ) 10 MG capsule Take 10 mg by mouth 4 (four) times daily. 12/17/20   [provider]  DULoxetine  (CYMBALTA ) 60 MG capsule Take 1 capsule (60 mg total) by mouth 2 (two) times daily. 04/10/19   Wonda Clarita BRAVO, NP  EPINEPHrine  0.3 mg/0.3 mL IJ SOAJ injection Inject 0.3 mg into the muscle as needed for anaphylaxis. 04/12/21   Eudelia Maude SAUNDERS, PA-C  famotidine  (PEPCID ) 20 MG tablet Take 20 mg by mouth 2 (two) times daily.    [provider]  ferrous sulfate  325 (65 FE) MG tablet Take 1 tablet (325 mg total) by mouth daily with breakfast. 01/29/23   Cheryle Page, MD  fluticasone  (FLONASE ) 50 MCG/ACT nasal spray Place 2 sprays into both nostrils daily as needed for allergies or rhinitis.    [provider]  gabapentin  (NEURONTIN ) 600 MG tablet Take 600 mg by mouth 3 (three) times daily.  [provider]  hydrocortisone  (ANUSOL -HC) 2.5 % rectal cream Place 1 Application rectally daily as needed for hemorrhoids. 07/29/18   [provider]  HYDROmorphone  (DILAUDID ) 8 MG tablet Take 0.5 tablets (4 mg total) by mouth every 3 (three) hours. 03/08/23     linaclotide  (LINZESS ) 290 MCG CAPS capsule Take 290 mcg by mouth daily before breakfast.    [provider]  magnesium  oxide (MAG-OX) 400 (240 Mg) MG tablet Take 400 mg by mouth daily. 12/19/22   [provider]  meclizine  (ANTIVERT ) 25 MG tablet Take 25 mg by mouth 3 (three) times daily.  04/15/22   [provider]  mesalamine  (LIALDA ) 1.2 g EC tablet Take 2.4 g by mouth daily with breakfast.    [provider]  methocarbamol  (ROBAXIN -750) 750 MG tablet Take 1 tablet (750 mg total) by mouth every 6 (six) hours as needed for muscle spasms. 09/16/23   Stechschulte, Deward PARAS, MD  midodrine  (PROAMATINE ) 5 MG tablet Take 15 mg by mouth 3 (three) times daily with meals.    [provider]  Multiple Vitamin (MULTIVITAMIN WITH MINERALS) TABS tablet Take 1 tablet by mouth daily.    [provider]  NARCAN  4 MG/0.1ML LIQD nasal spray kit Place 1 spray into the nose daily as needed (overdose). 03/18/20   [provider]  Nutritional Supplements (FEEDING SUPPLEMENT, OSMOLITE 1.5 CAL,) LIQD Place 1,000 mLs into feeding tube continuous. 09/16/23 12/15/23  Stechschulte, Deward PARAS, MD  omeprazole  (KONVOMEP) 2 mg/mL SUSP oral suspension Take 10 mLs (20 mg total) by mouth 2 (two) times daily before a meal. Patient taking differently: Take 10 mLs by mouth 2 (two) times daily before a meal. 08/04/23 09/14/23  Vernon Ranks, MD  ondansetron  (ZOFRAN ) 8 MG tablet Take 8 mg by mouth every 8 (eight) hours as needed for nausea or vomiting.    [provider]  oxybutynin  (DITROPAN ) 5 MG tablet Take 10 mg by mouth at bedtime.    [provider]  sucralfate  (CARAFATE ) 1 g tablet Take 1 tablet (1 g total) by mouth every 6 (six) hours. Patient taking differently: Take 1 g by mouth 4 (four) times daily. 01/21/23   Amin, Sumayya, MD  SUMAtriptan  (IMITREX ) 50 MG tablet Take 1 tablet (50 mg total) by mouth every 2 (two) hours as needed for migraine. May repeat in 2 hours if headache persists or recurs. 05/05/23   Gayland Lauraine PARAS, NP  SYMPROIC  0.2 MG TABS Take 0.2 mg by mouth daily. 05/13/21   [provider]  thiamine  (VITAMIN B-1) 100 MG tablet Take 100 mg by mouth daily at 12 noon.     [provider]  torsemide  (DEMADEX ) 5 MG tablet Take 5 mg by  mouth daily. 11/10/21   [provider]  valACYclovir  (VALTREX ) 1000 MG tablet Take 1,000 mg by mouth 2 (two) times daily as needed.    [provider]      Allergies    Ciprofloxacin , Coconut (cocos nucifera), Coconut oil, Morphine  and codeine, Oxycodone, Oxycodone-acetaminophen , Doxycycline, Penicillamine, Trazodone  and nefazodone, Vistaril  [hydroxyzine  hcl], Silicone, Penicillins, and Tape    Review of Systems   Review of Systems  Constitutional:  Negative for fever.  Gastrointestinal:  Positive for vomiting.  Musculoskeletal:  Positive for neck pain.  Neurological:  Positive for headaches.    Physical Exam Updated Vital Signs BP 111/70   Pulse 80   Temp (!) 97.5 F (36.4 C) (Oral)   Resp 16   LMP 06/02/2017 Comment: hysterectomy  06/08/2017  SpO2 100%  Physical Exam CONSTITUTIONAL: Chronically ill-appearing HEAD: Normocephalic/atraumatic EYES: EOMI/PERRL conjunctiva pale ENMT: Close membranes moist SPINE/BACK: Cervical spine tenderness No thoracic or lumbar tenderness No bruising/crepitance/stepoffs noted to spine CV: S1/S2 noted, no murmurs/rubs/gallops noted LUNGS: Lungs are clear to auscultation bilaterally, no apparent distress ABDOMEN: soft, not distended, diffuse tenderness,, PEG tube in place GU:no cva tenderness Rectal-no blood or melena, Hemoccult negative.  Nurse present for exam NEURO: Pt is awake/alert/appropriate, moves all extremitiesx4.  No facial droop.   EXTREMITIES: pulses normal/equal, full ROM, no deformities SKIN: warm, color normal   ED Results / Procedures / Treatments   Labs (all labs ordered are listed, but only abnormal results are displayed) Labs Reviewed  CBC - Abnormal; Notable for the following components:      Result Value   WBC 14.2 (*)    RBC 1.89 (*)    Hemoglobin 5.6 (*)    HCT 18.1 (*)    Platelets 442 (*)    All other components within normal limits  COMPREHENSIVE METABOLIC PANEL - Abnormal; Notable for the  following components:   Glucose, Bld 125 (*)    BUN 23 (*)    All other components within normal limits  LIPASE, BLOOD  I-STAT CG4 LACTIC ACID, ED  POC OCCULT BLOOD, ED  TYPE AND SCREEN  PREPARE RBC (CROSSMATCH)    EKG None  Radiology CT ABDOMEN PELVIS W CONTRAST Result Date: 10/01/2023 CLINICAL DATA:  Abdominal pain, acute, nonlocalized hematemesis EXAM: CT ABDOMEN AND PELVIS WITH CONTRAST TECHNIQUE: Multidetector CT imaging of the abdomen and pelvis was performed using the standard protocol following bolus administration of intravenous contrast. RADIATION DOSE REDUCTION: This exam was performed according to the departmental dose-optimization program which includes automated exposure control, adjustment of the mA and/or kV according to patient size and/or use of iterative reconstruction technique. CONTRAST:  OMNIPAQUE  IOHEXOL  300 MG/ML  SOLN COMPARISON:  CT abdomen pelvis 01/17/2023, CT abdomen pelvis 01/30/2017, CT abdomen pelvis 11/19/2017, CT abdomen pelvis 12/11/2020 FINDINGS: Lower chest: Tiny hiatal hernia.  No acute abnormality. Hepatobiliary: No focal liver abnormality. No gallstones, gallbladder wall thickening, or pericholecystic fluid. No biliary dilatation. Pancreas: No focal lesion. Normal pancreatic contour. No surrounding inflammatory changes. No main pancreatic ductal dilatation. Spleen: Normal in size. There is a lobulated 2.1 cm hypodense lesion within the spleen with a density of 37 Hounsfield unit. Finding has been increasing in size since 11/19/2017. New from 01/30/2017. Adrenals/Urinary Tract: No adrenal nodule bilaterally. Bilateral kidneys enhance symmetrically. No hydronephrosis. No hydroureter. The urinary bladder is unremarkable. Stomach/Bowel: Roux-en-Y gastric bypass. Gastrostomy tube with tip and inflated balloon terminating within the bypassed gastric lumen. Outpouching along the bypassed gastric lumen with associated bowel wall thickening and adjacent non  enhancement of the gastric wall with associated perigastric fat stranding (8:42). Stomach is within normal limits. No evidence of bowel wall thickening or dilatation. Appendix appears normal. Vascular/Lymphatic: No abdominal aorta or iliac aneurysm. No abdominal, pelvic, or inguinal lymphadenopathy. Reproductive: Status post hysterectomy. No adnexal masses. Other: No intraperitoneal free fluid. No intraperitoneal free gas. No organized fluid collection. Musculoskeletal: No abdominal wall hernia or abnormality. No suspicious lytic or blastic osseous lesions. No acute displaced fracture. IMPRESSION: 1. 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 No definite perforation. 2. Gastrostomy tube in appropriate position within the bypassed gastric lumen. 3. Tiny hiatal hernia. 4. Indeterminate slow growing splenic lesion now measuring 2.1 cm. Electronically Signed   By: Morgane  Naveau M.D.   On: 10/01/2023 03:23  CT Head Wo Contrast Result Date: 10/01/2023 CLINICAL DATA:  Head trauma, moderate-severe; Neck trauma, midline tenderness (Age 42-64y) EXAM: CT HEAD WITHOUT CONTRAST CT CERVICAL SPINE WITHOUT CONTRAST TECHNIQUE: Multidetector CT imaging of the head and cervical spine was performed following the standard protocol without intravenous contrast. Multiplanar CT image reconstructions of the cervical spine were also generated. RADIATION DOSE REDUCTION: This exam was performed according to the departmental dose-optimization program which includes automated exposure control, adjustment of the mA and/or kV according to patient size and/or use of iterative reconstruction technique. COMPARISON:  CT head and C-spine 02/28/2023 FINDINGS: CT HEAD FINDINGS Brain: No evidence of large-territorial acute infarction. No parenchymal hemorrhage. No mass lesion. No extra-axial collection. Similar nonspecific calcification of the cerebellum. No mass effect or midline shift. No hydrocephalus. Basilar cisterns are patent.  Vascular: No hyperdense vessel. Skull: No acute fracture or focal lesion. Sinuses/Orbits: Paranasal sinuses and mastoid air cells are clear. The orbits are unremarkable. Other: None. CT CERVICAL SPINE FINDINGS Alignment: Normal. Skull base and vertebrae: No acute fracture. No aggressive appearing focal osseous lesion or focal pathologic process. Soft tissues and spinal canal: No prevertebral fluid or swelling. No visible canal hematoma. Upper chest: Unremarkable. Other: None. IMPRESSION: 1. No acute intracranial abnormality. 2. No acute displaced fracture or traumatic listhesis of the cervical spine. Electronically Signed   By: Morgane  Naveau M.D.   On: 10/01/2023 02:51   CT Cervical Spine Wo Contrast Result Date: 10/01/2023 CLINICAL DATA:  Head trauma, moderate-severe; Neck trauma, midline tenderness (Age 66-64y) EXAM: CT HEAD WITHOUT CONTRAST CT CERVICAL SPINE WITHOUT CONTRAST TECHNIQUE: Multidetector CT imaging of the head and cervical spine was performed following the standard protocol without intravenous contrast. Multiplanar CT image reconstructions of the cervical spine were also generated. RADIATION DOSE REDUCTION: This exam was performed according to the departmental dose-optimization program which includes automated exposure control, adjustment of the mA and/or kV according to patient size and/or use of iterative reconstruction technique. COMPARISON:  CT head and C-spine 02/28/2023 FINDINGS: CT HEAD FINDINGS Brain: No evidence of large-territorial acute infarction. No parenchymal hemorrhage. No mass lesion. No extra-axial collection. Similar nonspecific calcification of the cerebellum. No mass effect or midline shift. No hydrocephalus. Basilar cisterns are patent. Vascular: No hyperdense vessel. Skull: No acute fracture or focal lesion. Sinuses/Orbits: Paranasal sinuses and mastoid air cells are clear. The orbits are unremarkable. Other: None. CT CERVICAL SPINE FINDINGS Alignment: Normal. Skull base and  vertebrae: No acute fracture. No aggressive appearing focal osseous lesion or focal pathologic process. Soft tissues and spinal canal: No prevertebral fluid or swelling. No visible canal hematoma. Upper chest: Unremarkable. Other: None. IMPRESSION: 1. No acute intracranial abnormality. 2. No acute displaced fracture or traumatic listhesis of the cervical spine. Electronically Signed   By: Morgane  Naveau M.D.   On: 10/01/2023 02:51    Procedures .Critical Care  Performed by: Midge Golas, MD Authorized by: Midge Golas, MD   Critical care provider statement:    Critical care time (minutes):  75   Critical care start time:  10/01/2023 1:45 AM   Critical care end time:  10/01/2023 3:00 AM   Critical care time was exclusive of:  Separately billable procedures and treating other patients   Critical care was necessary to treat or prevent imminent or life-threatening deterioration of the following conditions:  Shock, dehydration and circulatory failure   Critical care was time spent personally by me on the following activities:  Examination of patient, re-evaluation of patient's condition, review of old charts, ordering  and review of radiographic studies, ordering and review of laboratory studies, development of treatment plan with patient or surrogate, obtaining history from patient or surrogate, pulse oximetry, evaluation of patient's response to treatment and ordering and performing treatments and interventions   I assumed direction of critical care for this patient from another provider in my specialty: no     Care discussed with: admitting provider       Medications Ordered in ED Medications  0.9 %  sodium chloride  infusion (Manually program via Guardrails IV Fluids) (has no administration in time range)  pantoprazole  (PROTONIX ) injection 40 mg (40 mg Intravenous Not Given 10/01/23 0219)    Followed by  pantoprazole  (PROTONIX ) injection 40 mg (has no administration in time range)   ondansetron  (ZOFRAN ) injection 4 mg (4 mg Intramuscular Given 09/30/23 2324)  fentaNYL  (SUBLIMAZE ) injection 50 mcg (50 mcg Intravenous Given 10/01/23 0126)  sodium chloride  0.9 % bolus 1,000 mL (1,000 mLs Intravenous New Bag/Given 10/01/23 0125)  iohexol  (OMNIPAQUE ) 300 MG/ML solution 100 mL (100 mLs Intravenous Contrast Given 10/01/23 9787)    ED Course/ Medical Decision Making/ A&P Clinical Course as of 10/01/23 0440  Fri Oct 01, 2023  0146 Patient presents with hematemesis over the past several days after recent PEG tube placement.  Patient also had syncopal episode.  Will obtain CT head and C-spine after recent fall, and also CT abdomen pelvis.  Patient currently stable without any signs of active bleeding.  Will begin blood transfusion and patient has agreed to proceed [DW]  0147 WBC(!): 14.2 Leukocytosis [DW]  0147 Hemoglobin(!!): 5.6 Acute anemia [DW]  0211 Patient stable, awaiting blood transfusion.  Awaiting CT imaging and will be admitted [DW]  0424 Patient resting comfortably.  Currently receiving blood transfusion.  CT imaging is negative.  Patient will be admitted to the hospitalist service with GI consultation. [DW]  [phone] Discussed with Dr. Marcene for admission [DW]    Clinical Course User Index [DW] Midge Golas, MD                                 Medical Decision Making Amount and/or Complexity of Data Reviewed Labs: ordered. Decision-making details documented in ED Course. Radiology: ordered.  Risk Prescription drug management. Decision regarding hospitalization.   This patient presents to the ED for concern of vomiting, this involves an extensive number of treatment options, and is a complaint that carries with it a high risk of complications and morbidity.  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  The pertinent results include: Acute anemia  Imaging Studies ordered: I ordered imaging studies including CT scan head, C-spine, abdomen pelvis   I independently visualized and interpreted imaging which showed no acute findings I agree with the radiologist interpretation    Medicines ordered and prescription drug management: I ordered medication including Protonix  for upper GI bleed Reevaluation of the patient after these medicines showed that the patient    improved  Critical Interventions:   blood  transfusion  Consultations Obtained: I requested consultation with the admitting physician [facility]  , and discussed  findings as well as pertinent plan - they recommend: admit  Reevaluation: After the interventions noted above, I reevaluated the patient and found that they have :improved  Complexity of problems addressed: Patient's presentation is most consistent with  acute presentation with potential threat to life or bodily function  Disposition: After consideration of the diagnostic results and the patient's response to treatment,  I feel that the patent would benefit from admission   .           Final Clinical Impression(s) / ED Diagnoses Final diagnoses:  Hematemesis with nausea  Acute blood loss anemia    Rx / DC Orders ED Discharge Orders     None         Midge Golas, MD 10/01/23 [phone]

## 2023-10-01 NOTE — ED Notes (Signed)
 Patient transported to CT

## 2023-10-01 NOTE — ED Notes (Addendum)
 Pt ambulated to and from [facility] w/o need of assistance. Pt began complaining about IV in left [facility], pt stated it hurt and she was going to take it out if someone didn't do it for her. This writer explained to pt that second IV access was necessary in her ongoing care, pt verbally redirected multiple times. Pt began pulling on tape which was covering this left AC IV, this writer repeated to pt that it was very necessary in her care a second time. This clinical research associate left the room to notify Brianne, EMT-P.

## 2023-10-01 NOTE — Consult Note (Signed)
 [facility] Gastroenterology Consult  Referring Provider: No ref. provider found Primary Care Physician:  Elizbeth Leita Ruth, FNP Primary Gastroenterologist: [facility]  Reason for Consultation: Hematemesis  SUBJECTIVE:   HPI: [name] is a 48 y.o. female known to GI service who presented to hospital with chief complaint of hematemesis.  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 Postsurgical course has been complicated by marginal ulcer having undergone resection of gastric pouch, though continues to be complicated by marginal ulcer which was last noted on EGD 07/25/2023.  She has followed with [facility] and had laparoscopic remnant gastrostomy tube placement on 09/14/2023 to improve nutrition status.  She noted having multiple episodes (up to 20 episodes) of frank hematemesis prior to presentation.  Her last episode of hematemesis was yesterday.  She has not had further hematemesis in the emergency department.  She has significant abdominal pain located primarily in her lower abdomen.  Denies changes in bowel habits.  No chest pain or shortness of breath.  No fevers or chills though has been feeling subjectively cold/hot. She noted that her tube feeds were stopped roughly 30 minutes prior to my evaluation of her.  Labs on presentation showed hemoglobin 5.6, WBC 9.1, platelet 306, sodium 138, potassium 3.4, BUN/creatinine 21/0.43.  CT scan of abdomen pelvis on 10/01/2023 showed Roux-en-Y gastric bypass, gastrostomy tube and inflated balloon terminating within the bypassed gastric lumen, outpouching along bypassed gastric lumen with associated bowel wall thickening, perigastric fat stranding.  EGD 07/25/2023 for hematemesis and melena (Dr. Kriss) -showed oozing gastric ulcers with visible vessel status post bipolar cautery, jejunum within normal  limits.   Past Medical History:  Diagnosis Date   Anal fissure - posterior 10/16/2014    OCC  ISSUES   Anxiety    doesn't take anything   Asthma    has Albuterol  inhaler as needed   Bronchitis    in winter   Chronic headache disorder 07/22/2016   Clostridium difficile infection 04/20/2012   Dehydration    Depression    Family history of adverse reaction to anesthesia    pt mom gets sick   GERD (gastroesophageal reflux disease)    takes Pantoprazole  daily   Headache    Hiatal hernia    neuropathy - mild in arms and legs   History of blood transfusion    last transfusion was 04/04/2016=Benadryl  was given d/t itching. States she always itches with transfusion.    History of migraine    last one 05/01/16   History of stomach ulcers    History of urinary tract infection LAST 2 WEEKS AGO   IDA (iron  deficiency anemia)    Internal and external bleeding hemorrhoids 06/11/2014   Joint pain    Joint swelling    Left sided chronic colitis - segmental 06/11/2014   Marginal ulcer 10/25/2017   Motion sickness    Nausea    takes Zofran  as needed   Nausea and vomiting    for 1 year   Oligouria    Osteoarthritis    lower back, knees, wrists - no meds   Peripheral neuropathy 03/01/2019   Personal history of gastric bypass    Postoperative nausea and vomiting 01/21/2016   wants scopolamine  patch   Right carpal tunnel syndrome 03/01/2019   SVD (spontaneous vaginal delivery)    x 4   TOBACCO USER 10/02/2009   Qualifier: Diagnosis of  By: Lida  MD, Artist     Tremor 08/31/2018   UC (ulcerative colitis) (HCC)    supposed to be taking Lialda  and Bentyl  but has been off since gastric sleeve   Unsteady gait when walking 2024   uses a walker   Vertigo    doesn't take any meds   Past Surgical History:  Procedure Laterality Date   ABDOMINAL HYSTERECTOMY     PARTIAL   BIOPSY  01/20/2023   Procedure: BIOPSY;  Surgeon: Saintclair Jasper, MD;  Location: [facility];  Service: Gastroenterology;;    BLADDER STIMULATOR IMPLANT     COLONOSCOPY  2007   for rectal bleeding; [facility]   COLONOSCOPY N/A 06/11/2014   Procedure: COLONOSCOPY;  Surgeon: Lupita FORBES Commander, MD;  Location: [facility];  Service: Endoscopy;  Laterality: N/A;   COLONOSCOPY WITH PROPOFOL  N/A 03/02/2017   Procedure: COLONOSCOPY WITH PROPOFOL ;  Surgeon: Ethyl Lenis, MD;  Location: [facility];  Service: General;  Laterality: N/A;   DILATION AND CURETTAGE OF UTERUS     ESOPHAGOGASTRODUODENOSCOPY N/A 07/25/2023   Procedure: ESOPHAGOGASTRODUODENOSCOPY (EGD);  Surgeon: Kriss Estefana DEL, DO;  Location: [facility];  Service: Gastroenterology;  Laterality: N/A;   ESOPHAGOGASTRODUODENOSCOPY (EGD) WITH PROPOFOL  N/A 04/06/2016   Procedure: ESOPHAGOGASTRODUODENOSCOPY (EGD) WITH PROPOFOL ;  Surgeon: Lenis Ethyl, MD;  [facility]: [facility];  Service: General;  Laterality: N/A;   ESOPHAGOGASTRODUODENOSCOPY (EGD) WITH PROPOFOL  N/A 03/02/2017   Procedure: ESOPHAGOGASTRODUODENOSCOPY (EGD) WITH PROPOFOL ;  Surgeon: Ethyl Lenis, MD;  Location: [facility];  Service: General;  Laterality: N/A;   ESOPHAGOGASTRODUODENOSCOPY (EGD) WITH PROPOFOL  N/A 05/20/2017   Procedure: ESOPHAGOGASTRODUODENOSCOPY (EGD) WITH PROPOFOL  ERAS PATHWAY;  Surgeon: Ethyl Lenis, MD;  Location: [facility];  Service: General;  Laterality: N/A;   ESOPHAGOGASTRODUODENOSCOPY (EGD) WITH PROPOFOL  N/A 10/07/2017   Procedure: ESOPHAGOGASTRODUODENOSCOPY (EGD) WITH PROPOFOL ;  Surgeon: Ethyl Lenis, MD;  Location: [facility];  Service: General;  Laterality: N/A;   ESOPHAGOGASTRODUODENOSCOPY (EGD) WITH PROPOFOL  N/A 01/20/2023   Procedure: ESOPHAGOGASTRODUODENOSCOPY (EGD) WITH PROPOFOL ;  Surgeon: Saintclair Jasper, MD;  [facility]: [facility];  Service: Gastroenterology;  Laterality: N/A;   EXCISION OF SKIN TAG  06/08/2017   Procedure: EXCISION OF VULVAR SKIN TAGS X2;  Surgeon: Fredirick Glenys RAMAN, MD;  Location: [facility];  Service: Gynecology;;   FOOT SURGERY Bilateral    x 2    GASTRIC ROUX-EN-Y N/A 12/14/2016   Procedure: LAPAROSCOPIC REVISION SLEEVE GASTRECTOMY TO  ROUX-Y-GASTRIC BY-PASS, UPPER ENDO;  Surgeon: Morene Olives, MD;  Location: [facility];  Service: General;  Laterality: N/A;   GASTROJEJUNOSTOMY N/A 05/11/2016   Procedure: LAPAROSCOPIC PLACEMENT  OF FEEDING  JEJUNOSTOMY TUBE;  Surgeon: Morene Olives, MD;  [facility]: [facility];  Service: General;  Laterality: N/A;   HEMORRHOIDECTOMY WITH HEMORRHOID BANDING     HEMOSTASIS CLIP PLACEMENT  07/25/2023   Procedure: HEMOSTASIS CLIP PLACEMENT;  Surgeon: Kriss Estefana DEL, DO;  [facility]: [facility];  Service: Gastroenterology;;   HOT HEMOSTASIS N/A 07/25/2023   Procedure: HOT HEMOSTASIS (ARGON PLASMA COAGULATION/BICAP);  Surgeon: Kriss Estefana DEL, DO;  Location: [facility];  Service: Gastroenterology;  Laterality: N/A;   IR FLUORO GUIDE CV LINE RIGHT  04/06/2017   IR GASTROSTOMY TUBE MOD SED  08/25/2023   IR GENERIC HISTORICAL  05/19/2016   IR CM INJ ANY COLONIC TUBE W/FLUORO 05/19/2016 Marcey Moan, MD [facility]   IR PATIENT EVAL TECH 0-60 MINS  11/30/2017   IR REPLC DUODEN/JEJUNO TUBE PERCUT W/FLUORO  03/14/2018   IR REPLC DUODEN/JEJUNO TUBE PERCUT W/FLUORO  03/15/2018   IR US  GUIDE VASC ACCESS  LEFT  08/25/2023   IR US  GUIDE VASC ACCESS LEFT  08/25/2023   IR US  GUIDE VASC ACCESS RIGHT  04/06/2017   j tube removed march 2018     KNEE ARTHROSCOPY Left 09/06/2014   LAPAROSCOPIC GASTRIC SLEEVE RESECTION N/A 01/14/2016   Procedure: LAPAROSCOPIC GASTRIC SLEEVE RESECTION;  Surgeon: Morene Olives, MD;  [facility];  Service: General;  Laterality: N/A;   LAPAROSCOPIC INSERTION GASTROSTOMY TUBE N/A 09/14/2023   Procedure: LAPAROSCOPIC REMNANT GASTROSTOMY TUBE PLACEMENT;  Surgeon: Lyndel Deward PARAS, MD;  [facility]: [facility];  Service: General;  Laterality: N/A;   LAPAROSCOPIC REVISION OF GASTROJEJUNOSTOMY N/A 10/25/2017   Procedure: LAPAROSCOPIC REVISION OF GASTROJEJUNOSTOMY AND PARTIAL  GASTRECTOMY, WITH PLACEMENT OF FEEDING GASTROSTOMY TUBE;  Surgeon: Olives Morene, MD;  [facility]: [facility];  Service: General;  Laterality: N/A;   LAPAROSCOPIC TUBAL LIGATION  10/16/2011   Procedure: LAPAROSCOPIC TUBAL LIGATION;  Surgeon: Nena DELENA App, MD;  Location: [facility];  Service: Gynecology;  Laterality: Bilateral;   NOVASURE ABLATION  09/28/2010   mild persistent vaginal bleeding   right knee arthroscopy     05/07/2016   TOTAL KNEE ARTHROPLASTY Left 03/10/2018   Procedure: LEFT TOTAL KNEE ARTHROPLASTY;  Surgeon: Ernie Cough, MD;  [facility]: [facility];  Service: Orthopedics;  Laterality: Left;  70 mins   TOTAL KNEE ARTHROPLASTY Right 01/23/2020   Procedure: TOTAL KNEE ARTHROPLASTY, BILATERAL TROCHANTERIC INJECTION;  Surgeon: Ernie Cough, MD;  Location: [facility];  Service: Orthopedics;  Laterality: Right;  70 mins for Bilateral Troch injection 1 cc Depo and 2 CC Lidocaine    VAGINAL HYSTERECTOMY Bilateral 06/08/2017   Procedure: HYSTERECTOMY VAGINAL W/ BILATERAL SALPINGECTOMY;  Surgeon: Fredirick Glenys RAMAN, MD;  Location: [facility];  Service: Gynecology;  Laterality: Bilateral;   wisdom teeth extracted     Prior to Admission medications   Medication Sig Start Date End Date Taking? Authorizing Provider  acetaminophen  (TYLENOL ) 500 MG tablet Take 2 tablets (1,000 mg total) by mouth every 6 (six) hours as needed. 09/16/23 09/15/24 Yes Stechschulte, Deward PARAS, MD  Atogepant  (QULIPTA ) 30 MG TABS Take 1 tablet (30 mg total) by mouth daily. 05/05/23  Yes Gayland Lauraine PARAS, NP  atorvastatin  (LIPITOR) 40 MG tablet Take 40 mg by mouth daily. 12/10/22  Yes [provider]  botulinum toxin Type A  (BOTOX ) 200 units injection INJECT 155 UNITS INTRAMUSCULARLY INTO HEAD AND NECK MUSCLES EVERY 3 MONTHS 11/23/22  Yes Onita Duos, MD  Buprenorphine  HCl (BELBUCA ) 75 MCG FILM Place 75 mcg under the tongue in the morning and at bedtime.   Yes [provider]  buPROPion  (WELLBUTRIN  XL) 300 MG 24 hr tablet Take  300 mg by mouth every morning. Take along with 150 mg=450 mg 06/05/20  Yes [provider]  busPIRone  (BUSPAR ) 15 MG tablet Take 15 mg by mouth 3 (three) times daily.   Yes [provider]  Cholecalciferol (VITAMIN D3) 1.25 MG (50000 UT) CAPS Take 50,000 Units by mouth every 7 (seven) days.  04/03/19  Yes [provider]  clonazePAM  (KLONOPIN ) 1 MG tablet Take 1 mg by mouth 2 (two) times daily as needed for anxiety.   Yes [provider]  cyanocobalamin  (,VITAMIN B-12,) 1000 MCG/ML injection Inject 1,000 mcg into the muscle 2 (two) times a week.  04/06/19  Yes [provider]  diclofenac  Sodium (VOLTAREN ) 1 % GEL Apply 2 g topically 4 (four) times daily as needed (Pain).   Yes [provider]  dicyclomine  (BENTYL ) 10 MG capsule Take 10 mg by  mouth 4 (four) times daily. 12/17/20  Yes [provider]  DULoxetine  (CYMBALTA ) 60 MG capsule Take 1 capsule (60 mg total) by mouth 2 (two) times daily. 04/10/19  Yes Wonda Clarita BRAVO, NP  EPINEPHrine  0.3 mg/0.3 mL IJ SOAJ injection Inject 0.3 mg into the muscle as needed for anaphylaxis. 04/12/21  Yes Eudelia Maude SAUNDERS, PA-C  famotidine  (PEPCID ) 20 MG tablet Take 20 mg by mouth 2 (two) times daily.   Yes [provider]  ferrous sulfate  325 (65 FE) MG tablet Take 1 tablet (325 mg total) by mouth daily with breakfast. 01/29/23  Yes Cheryle Page, MD  fluticasone  (FLONASE ) 50 MCG/ACT nasal spray Place 2 sprays into both nostrils daily as needed for allergies or rhinitis.   Yes [provider]  gabapentin  (NEURONTIN ) 600 MG tablet Take 600 mg by mouth 3 (three) times daily.   Yes [provider]  hydrocortisone  (ANUSOL -HC) 2.5 % rectal cream Place 1 Application rectally daily as needed for hemorrhoids. 07/29/18  Yes [provider]  HYDROmorphone  (DILAUDID ) 8 MG tablet Take 0.5 tablets (4 mg total) by mouth every 3 (three) hours. 03/08/23  Yes   linaclotide  (LINZESS ) 290 MCG  CAPS capsule Take 290 mcg by mouth daily before breakfast.   Yes [provider]  magnesium  oxide (MAG-OX) 400 (240 Mg) MG tablet Take 400 mg by mouth daily. 12/19/22  Yes [provider]  meclizine  (ANTIVERT ) 25 MG tablet Take 25 mg by mouth 3 (three) times daily. 04/15/22  Yes [provider]  mesalamine  (LIALDA ) 1.2 g EC tablet Take 2.4 g by mouth daily with breakfast.   Yes [provider]  methocarbamol  (ROBAXIN -750) 750 MG tablet Take 1 tablet (750 mg total) by mouth every 6 (six) hours as needed for muscle spasms. 09/16/23  Yes Stechschulte, Deward PARAS, MD  midodrine  (PROAMATINE ) 5 MG tablet Take 15 mg by mouth 3 (three) times daily with meals.   Yes [provider]  Multiple Vitamin (MULTIVITAMIN WITH MINERALS) TABS tablet Take 1 tablet by mouth daily.   Yes [provider]  NARCAN  4 MG/0.1ML LIQD nasal spray kit Place 1 spray into the nose daily as needed (overdose). 03/18/20  Yes [provider]  Nutritional Supplements (FEEDING SUPPLEMENT, OSMOLITE 1.5 CAL,) LIQD Place 1,000 mLs into feeding tube continuous. 09/16/23 12/15/23 Yes Stechschulte, Deward PARAS, MD  omeprazole  (KONVOMEP) 2 mg/mL SUSP oral suspension Take 10 mLs (20 mg total) by mouth 2 (two) times daily before a meal. Patient taking differently: Take 10 mLs by mouth 2 (two) times daily before a meal. 08/04/23 10/01/23 Yes Pahwani, Fredia, MD  ondansetron  (ZOFRAN ) 8 MG tablet Take 8 mg by mouth every 8 (eight) hours as needed for nausea or vomiting.   Yes [provider]  oxybutynin  (DITROPAN ) 5 MG tablet Take 10 mg by mouth at bedtime.   Yes [provider]  sucralfate  (CARAFATE ) 1 g tablet Take 1 tablet (1 g total) by mouth every 6 (six) hours. Patient taking differently: Take 1 g by mouth 4 (four) times daily. 01/21/23  Yes Caleen Qualia, MD  SUMAtriptan  (IMITREX ) 50 MG tablet Take 1 tablet (50 mg total) by mouth every 2 (two) hours as needed for migraine. May repeat  in 2 hours if headache persists or recurs. 05/05/23  Yes Gayland Lauraine PARAS, NP  SYMPROIC  0.2 MG TABS Take 0.2 mg by mouth daily. 05/13/21  Yes [provider]  thiamine  (VITAMIN B-1) 100 MG tablet Take 100 mg by mouth daily at  12 noon.    Yes [provider]  torsemide  (DEMADEX ) 5 MG tablet Take 5 mg by mouth daily. 11/10/21  Yes [provider]  valACYclovir  (VALTREX ) 1000 MG tablet Take 1,000 mg by mouth 2 (two) times daily as needed.   Yes [provider]  albuterol  (VENTOLIN  HFA) 108 (90 Base) MCG/ACT inhaler Inhale 2 puffs into the lungs every 6 (six) hours as needed for wheezing or shortness of breath.    [provider]   Current Facility-Administered Medications  Medication Dose Route Frequency Provider Last Rate Last Admin   Atogepant  TABS 30 mg  30 mg Oral Daily Celinda Alm Lot, MD       atorvastatin  (LIPITOR) tablet 40 mg  40 mg Oral q1800 Celinda Alm Lot, MD       Buprenorphine  HCl FILM 75 mcg  75 mcg Sublingual BID Celinda Alm Lot, MD       [START ON 10/02/2023] buPROPion  (WELLBUTRIN  XL) 24 hr tablet 450 mg  450 mg Oral q morning Celinda Alm Lot, MD       busPIRone  (BUSPAR ) tablet 15 mg  15 mg Oral TID Celinda Alm Lot, MD       clonazePAM  (KLONOPIN ) tablet 1 mg  1 mg Oral BID PRN Celinda Alm Lot, MD       DULoxetine  (CYMBALTA ) DR capsule 60 mg  60 mg Oral BID Celinda Alm Lot, MD       famotidine  (PEPCID ) tablet 20 mg  20 mg Oral BID Celinda Alm Lot, MD       fentaNYL  (SUBLIMAZE ) injection 25 mcg  25 mcg Intravenous Q2H PRN Howerter, Justin B, DO   25 mcg at 10/01/23 1041   gabapentin  (NEURONTIN ) capsule 600 mg  600 mg Oral TID Celinda Alm Lot, MD       HYDROmorphone  (DILAUDID ) tablet 4 mg  4 mg Oral Q3H Celinda Alm Lot, MD       [START ON 10/02/2023] linaclotide  (LINZESS ) capsule 290 mcg  290 mcg Oral QAC breakfast Celinda Alm Lot, MD       magnesium  oxide (MAG-OX) tablet 400 mg  400 mg Oral Daily Celinda Alm Lot, MD       [START ON 10/02/2023] mesalamine  (LIALDA ) EC tablet 2.4 g  2.4 g Oral Q breakfast Celinda Alm Lot, MD       methocarbamol  (ROBAXIN ) tablet 750 mg  750 mg Oral Q6H PRN Celinda Alm Lot, MD       midodrine  (PROAMATINE ) tablet 15 mg  15 mg Oral TID WC Celinda Alm Lot, MD       naloxone  (NARCAN ) injection 0.4 mg  0.4 mg Intravenous PRN Howerter, Justin B, DO       ondansetron  (ZOFRAN ) injection 4 mg  4 mg Intravenous Q6H PRN Howerter, Justin B, DO   4 mg at 10/01/23 9384   pantoprazole  (PROTONIX ) injection 40 mg  40 mg Intravenous Q12H Midge Golas, MD       sucralfate  (CARAFATE ) tablet 1 g  1 g Oral QID Celinda Alm Lot, MD       SUMAtriptan  (IMITREX ) tablet 50 mg  50 mg Oral Q2H PRN Celinda Alm Lot, MD       thiamine  (VITAMIN B1) tablet 100 mg  100 mg Oral Q1200 Celinda Alm Lot, MD       Current Outpatient Medications  Medication Sig Dispense Refill   acetaminophen  (TYLENOL ) 500 MG tablet Take 2 tablets (1,000 mg total) by mouth every 6 (six) hours as needed. 100  tablet 2   Atogepant  (QULIPTA ) 30 MG TABS Take 1 tablet (30 mg total) by mouth daily. 30 tablet 6   atorvastatin  (LIPITOR) 40 MG tablet Take 40 mg by mouth daily.     botulinum toxin Type A  (BOTOX ) 200 units injection INJECT 155 UNITS INTRAMUSCULARLY INTO HEAD AND NECK MUSCLES EVERY 3 MONTHS 1 each 3   Buprenorphine  HCl (BELBUCA ) 75 MCG FILM Place 75 mcg under the tongue in the morning and at bedtime.     buPROPion  (WELLBUTRIN  XL) 300 MG 24 hr tablet Take 300 mg by mouth every morning. Take along with 150 mg=450 mg     busPIRone  (BUSPAR ) 15 MG tablet Take 15 mg by mouth 3 (three) times daily.     Cholecalciferol (VITAMIN D3) 1.25 MG (50000 UT) CAPS Take 50,000 Units by mouth every 7 (seven) days.      clonazePAM  (KLONOPIN ) 1 MG tablet Take 1 mg by mouth 2 (two) times daily as needed for anxiety.     cyanocobalamin  (,VITAMIN B-12,) 1000 MCG/ML injection Inject 1,000 mcg into the muscle 2  (two) times a week.      diclofenac  Sodium (VOLTAREN ) 1 % GEL Apply 2 g topically 4 (four) times daily as needed (Pain).     dicyclomine  (BENTYL ) 10 MG capsule Take 10 mg by mouth 4 (four) times daily.     DULoxetine  (CYMBALTA ) 60 MG capsule Take 1 capsule (60 mg total) by mouth 2 (two) times daily. 60 capsule 0   EPINEPHrine  0.3 mg/0.3 mL IJ SOAJ injection Inject 0.3 mg into the muscle as needed for anaphylaxis. 1 each 0   famotidine  (PEPCID ) 20 MG tablet Take 20 mg by mouth 2 (two) times daily.     ferrous sulfate  325 (65 FE) MG tablet Take 1 tablet (325 mg total) by mouth daily with breakfast.     fluticasone  (FLONASE ) 50 MCG/ACT nasal spray Place 2 sprays into both nostrils daily as needed for allergies or rhinitis.     gabapentin  (NEURONTIN ) 600 MG tablet Take 600 mg by mouth 3 (three) times daily.     hydrocortisone  (ANUSOL -HC) 2.5 % rectal cream Place 1 Application rectally daily as needed for hemorrhoids.     HYDROmorphone  (DILAUDID ) 8 MG tablet Take 0.5 tablets (4 mg total) by mouth every 3 (three) hours. 120 tablet 0   linaclotide  (LINZESS ) 290 MCG CAPS capsule Take 290 mcg by mouth daily before breakfast.     magnesium  oxide (MAG-OX) 400 (240 Mg) MG tablet Take 400 mg by mouth daily.     meclizine  (ANTIVERT ) 25 MG tablet Take 25 mg by mouth 3 (three) times daily.     mesalamine  (LIALDA ) 1.2 g EC tablet Take 2.4 g by mouth daily with breakfast.     methocarbamol  (ROBAXIN -750) 750 MG tablet Take 1 tablet (750 mg total) by mouth every 6 (six) hours as needed for muscle spasms. 30 tablet 0   midodrine  (PROAMATINE ) 5 MG tablet Take 15 mg by mouth 3 (three) times daily with meals.     Multiple Vitamin (MULTIVITAMIN WITH MINERALS) TABS tablet Take 1 tablet by mouth daily.     NARCAN  4 MG/0.1ML LIQD nasal spray kit Place 1 spray into the nose daily as needed (overdose).     Nutritional Supplements (FEEDING SUPPLEMENT, OSMOLITE 1.5 CAL,) LIQD Place 1,000 mLs into feeding tube continuous. 29000  mL 2   omeprazole  (KONVOMEP) 2 mg/mL SUSP oral suspension Take 10 mLs (20 mg total) by mouth 2 (two) times daily before a meal. (  Patient taking differently: Take 10 mLs by mouth 2 (two) times daily before a meal.) 600 mL 0   ondansetron  (ZOFRAN ) 8 MG tablet Take 8 mg by mouth every 8 (eight) hours as needed for nausea or vomiting.     oxybutynin  (DITROPAN ) 5 MG tablet Take 10 mg by mouth at bedtime.     sucralfate  (CARAFATE ) 1 g tablet Take 1 tablet (1 g total) by mouth every 6 (six) hours. (Patient taking differently: Take 1 g by mouth 4 (four) times daily.) 120 tablet 1   SUMAtriptan  (IMITREX ) 50 MG tablet Take 1 tablet (50 mg total) by mouth every 2 (two) hours as needed for migraine. May repeat in 2 hours if headache persists or recurs. 10 tablet 6   SYMPROIC  0.2 MG TABS Take 0.2 mg by mouth daily.     thiamine  (VITAMIN B-1) 100 MG tablet Take 100 mg by mouth daily at 12 noon.      torsemide  (DEMADEX ) 5 MG tablet Take 5 mg by mouth daily.     valACYclovir  (VALTREX ) 1000 MG tablet Take 1,000 mg by mouth 2 (two) times daily as needed.     albuterol  (VENTOLIN  HFA) 108 (90 Base) MCG/ACT inhaler Inhale 2 puffs into the lungs every 6 (six) hours as needed for wheezing or shortness of breath.     Allergies as of 09/30/2023 - Review Complete 09/30/2023  Allergen Reaction Noted   Ciprofloxacin  Itching and Rash 04/20/2012   Coconut (cocos nucifera) Anaphylaxis and Itching 06/14/2017   Coconut oil Anaphylaxis, Itching, and Other (See Comments) 06/14/2017   Morphine  and codeine Anaphylaxis, Hives, and Other (See Comments) 09/02/2011   Oxycodone Anaphylaxis 09/02/2011   Oxycodone-acetaminophen  Anaphylaxis 10/08/2021   Doxycycline Nausea And Vomiting and Other (See Comments) 09/02/2011   Penicillamine Hives and Other (See Comments) 08/02/2018   Trazodone  and nefazodone Rash 04/10/2019   Vistaril  [hydroxyzine  hcl] Hives 04/10/2019   Silicone  06/11/2022   Penicillins Rash 09/02/2011   Tape Rash  01/31/2017   Family History  Problem Relation Age of Onset   Hypertension Mother    Diabetes Mother    Sarcoidosis Mother        lungs and skin   Asthma Mother    Hypertension Father    Diabetes Father    Asthma Son    Tics Son    Asthma Sister    Cancer Sister        possible pancreatic cancer   Adrenal disorder Sister        Tumor    Asthma Brother    Asthma Daughter        died age 303.5   Cancer Daughter 4       brain; died age 303.5   Asthma Son    Cancer Paternal Aunt        brain, colon, lung and esophagus; unsure of primary    Breast cancer Paternal Aunt    Social History   Socioeconomic History   Marital status: Single    Spouse name: Not on file   Number of children: 3   Years of education: Some college   Highest education level: Not on file  Occupational History   Occupation: English As A Second Language Teacher    Comment: Currently out on disability d/t surg  Tobacco Use   Smoking status: Former    Current packs/day: 0.00    Average packs/day: 0.5 packs/day for 20.0 years (10.0 ttl pk-yrs)    Types: Cigarettes    Start date: 03/17/1994  Quit date: 03/17/2014    Years since quitting: 9.5   Smokeless tobacco: Never  Vaping Use   Vaping status: Never Used  Substance and Sexual Activity   Alcohol use: Not Currently   Drug use: No   Sexual activity: Not Currently    Birth control/protection: Surgical, Abstinence  Other Topics Concern   Not on file  Social History Narrative   Lives with 2 sons   Right Handed   Drinks caffeine2-3 cups daily   Social Drivers of Health   Financial Resource Strain: Low Risk  (04/08/2019)   Overall Financial Resource Strain (CARDIA)    Difficulty of Paying Living Expenses: Not hard at all  Food Insecurity: Food Insecurity Present (09/14/2023)   Hunger Vital Sign    Worried About Running Out of Food in the Last Year: Sometimes true    Ran Out of Food in the Last Year: Sometimes true  Transportation Needs: No Transportation Needs  (09/14/2023)   PRAPARE - Administrator, Civil Service (Medical): No    Lack of Transportation (Non-Medical): No  Physical Activity: Inactive (04/08/2019)   Exercise Vital Sign    Days of Exercise per Week: 0 days    Minutes of Exercise per Session: 0 min  Stress: Stress Concern Present (04/08/2019)   [employer] - Occupational Stress Questionnaire    Feeling of Stress : To some extent  Social Connections: Unknown (01/29/2022)   Received from [facility]   Social Network    Social Network: Not on file  Intimate Partner Violence: Not At Risk (09/14/2023)   Humiliation, Afraid, Rape, and Kick questionnaire    Fear of Current or Ex-Partner: No    Emotionally Abused: No    Physically Abused: No    Sexually Abused: No   Review of Systems:  Review of Systems  Respiratory:  Negative for shortness of breath.   Cardiovascular:  Negative for chest pain.  Gastrointestinal:  Positive for abdominal pain, nausea and vomiting. Negative for blood in stool, constipation and diarrhea.    OBJECTIVE:   Temp:  [97.5 F (36.4 C)-98.7 F (37.1 C)] 98.5 F (36.9 C) (01/03 1040) Pulse Rate:  [74-101] 78 (01/03 1040) Resp:  [14-18] 16 (01/03 1040) BP: (95-128)/(70-80) 109/77 (01/03 1040) SpO2:  [100 %] 100 % (01/03 1040)   Physical Exam Constitutional:      General: She is not in acute distress.    Appearance: She is not ill-appearing, toxic-appearing or diaphoretic.  Cardiovascular:     Rate and Rhythm: Normal rate and regular rhythm.  Pulmonary:     Effort: No respiratory distress.     Breath sounds: Normal breath sounds.  Abdominal:     General: Bowel sounds are normal. There is no distension.     Palpations: Abdomen is soft.     Tenderness: There is no abdominal tenderness. There is no guarding.     Comments: PEG tube noted, no tube feeds running.  Neurological:     Mental Status: She is alert.     Labs: Recent Labs     09/30/23 2322 10/01/23 0450  WBC 14.2* 9.1  HGB 5.6* 5.4*  HCT 18.1* 16.9*  PLT 442* 306   BMET Recent Labs    09/30/23 2322 10/01/23 0450  NA 135 138  K 3.6 3.4*  CL 105 109  CO2 23 25  GLUCOSE 125* 101*  BUN 23* 21*  CREATININE 0.73 0.43*  CALCIUM  9.2 8.3*   LFT Recent  Labs    10/01/23 0450  PROT 5.6*  ALBUMIN  3.0*  AST 17  ALT 14  ALKPHOS 38  BILITOT 0.3   PT/INR No results for input(s): LABPROT, INR in the last 72 hours.  Diagnostic imaging: CT ABDOMEN PELVIS W CONTRAST Result Date: 10/01/2023 CLINICAL DATA:  Abdominal pain, acute, nonlocalized hematemesis EXAM: CT ABDOMEN AND PELVIS WITH CONTRAST TECHNIQUE: Multidetector CT imaging of the abdomen and pelvis was performed using the standard protocol following bolus administration of intravenous contrast. RADIATION DOSE REDUCTION: This exam was performed according to the departmental dose-optimization program which includes automated exposure control, adjustment of the mA and/or kV according to patient size and/or use of iterative reconstruction technique. CONTRAST:  OMNIPAQUE  IOHEXOL  300 MG/ML  SOLN COMPARISON:  CT abdomen pelvis 01/17/2023, CT abdomen pelvis 01/30/2017, CT abdomen pelvis 11/19/2017, CT abdomen pelvis 12/11/2020 FINDINGS: Lower chest: Tiny hiatal hernia.  No acute abnormality. Hepatobiliary: No focal liver abnormality. No gallstones, gallbladder wall thickening, or pericholecystic fluid. No biliary dilatation. Pancreas: No focal lesion. Normal pancreatic contour. No surrounding inflammatory changes. No main pancreatic ductal dilatation. Spleen: Normal in size. There is a lobulated 2.1 cm hypodense lesion within the spleen with a density of 37 Hounsfield unit. Finding has been increasing in size since 11/19/2017. New from 01/30/2017. Adrenals/Urinary Tract: No adrenal nodule bilaterally. Bilateral kidneys enhance symmetrically. No hydronephrosis. No hydroureter. The urinary bladder is unremarkable.  Stomach/Bowel: Roux-en-Y gastric bypass. Gastrostomy tube with tip and inflated balloon terminating within the bypassed gastric lumen. Outpouching along the bypassed gastric lumen with associated bowel wall thickening and adjacent non enhancement of the gastric wall with associated perigastric fat stranding (8:42). Stomach is within normal limits. No evidence of bowel wall thickening or dilatation. Appendix appears normal. Vascular/Lymphatic: No abdominal aorta or iliac aneurysm. No abdominal, pelvic, or inguinal lymphadenopathy. Reproductive: Status post hysterectomy. No adnexal masses. Other: No intraperitoneal free fluid. No intraperitoneal free gas. No organized fluid collection. Musculoskeletal: No abdominal wall hernia or abnormality. No suspicious lytic or blastic osseous lesions. No acute displaced fracture. IMPRESSION: 1. 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 No definite perforation. 2. Gastrostomy tube in appropriate position within the bypassed gastric lumen. 3. Tiny hiatal hernia. 4. Indeterminate slow growing splenic lesion now measuring 2.1 cm. Electronically Signed   By: Morgane  Naveau M.D.   On: 10/01/2023 03:23   CT Head Wo Contrast Result Date: 10/01/2023 CLINICAL DATA:  Head trauma, moderate-severe; Neck trauma, midline tenderness (Age 27-64y) EXAM: CT HEAD WITHOUT CONTRAST CT CERVICAL SPINE WITHOUT CONTRAST TECHNIQUE: Multidetector CT imaging of the head and cervical spine was performed following the standard protocol without intravenous contrast. Multiplanar CT image reconstructions of the cervical spine were also generated. RADIATION DOSE REDUCTION: This exam was performed according to the departmental dose-optimization program which includes automated exposure control, adjustment of the mA and/or kV according to patient size and/or use of iterative reconstruction technique. COMPARISON:  CT head and C-spine 02/28/2023 FINDINGS: CT HEAD FINDINGS Brain: No evidence of  large-territorial acute infarction. No parenchymal hemorrhage. No mass lesion. No extra-axial collection. Similar nonspecific calcification of the cerebellum. No mass effect or midline shift. No hydrocephalus. Basilar cisterns are patent. Vascular: No hyperdense vessel. Skull: No acute fracture or focal lesion. Sinuses/Orbits: Paranasal sinuses and mastoid air cells are clear. The orbits are unremarkable. Other: None. CT CERVICAL SPINE FINDINGS Alignment: Normal. Skull base and vertebrae: No acute fracture. No aggressive appearing focal osseous lesion or focal pathologic process. Soft tissues and spinal canal: No prevertebral fluid or  swelling. No visible canal hematoma. Upper chest: Unremarkable. Other: None. IMPRESSION: 1. No acute intracranial abnormality. 2. No acute displaced fracture or traumatic listhesis of the cervical spine. Electronically Signed   By: Morgane  Naveau M.D.   On: 10/01/2023 02:51   CT Cervical Spine Wo Contrast Result Date: 10/01/2023 CLINICAL DATA:  Head trauma, moderate-severe; Neck trauma, midline tenderness (Age 22-64y) EXAM: CT HEAD WITHOUT CONTRAST CT CERVICAL SPINE WITHOUT CONTRAST TECHNIQUE: Multidetector CT imaging of the head and cervical spine was performed following the standard protocol without intravenous contrast. Multiplanar CT image reconstructions of the cervical spine were also generated. RADIATION DOSE REDUCTION: This exam was performed according to the departmental dose-optimization program which includes automated exposure control, adjustment of the mA and/or kV according to patient size and/or use of iterative reconstruction technique. COMPARISON:  CT head and C-spine 02/28/2023 FINDINGS: CT HEAD FINDINGS Brain: No evidence of large-territorial acute infarction. No parenchymal hemorrhage. No mass lesion. No extra-axial collection. Similar nonspecific calcification of the cerebellum. No mass effect or midline shift. No hydrocephalus. Basilar cisterns are patent.  Vascular: No hyperdense vessel. Skull: No acute fracture or focal lesion. Sinuses/Orbits: Paranasal sinuses and mastoid air cells are clear. The orbits are unremarkable. Other: None. CT CERVICAL SPINE FINDINGS Alignment: Normal. Skull base and vertebrae: No acute fracture. No aggressive appearing focal osseous lesion or focal pathologic process. Soft tissues and spinal canal: No prevertebral fluid or swelling. No visible canal hematoma. Upper chest: Unremarkable. Other: None. IMPRESSION: 1. No acute intracranial abnormality. 2. No acute displaced fracture or traumatic listhesis of the cervical spine. Electronically Signed   By: Morgane  Naveau M.D.   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 Protein calorie malnutrition  -S/p laparoscopic remnant gastrostomy tube placement 09/14/23   PLAN: -Recommend continue surgical management for treatment resistant marginal ulceration -Recommend EGD to further evaluate marginal ulcer and provide any hemostatic treatment as appropriate, discussed this with patient including benefits/alternatives and risks of bleeding, infection, perforation, missed lesion, anesthesia, she verbalized understanding and elected to proceed -She requires further resuscitation to maintain Hgb > 7 prior to EGD procedure, receiving blood transfusion, trend H/H today and tomorrow AM -Continue IV PPI Q12Hr -Continue sucralfate  suspension 1 gm PO QID -NPO at midnight for possible EGD with Dr. Burnette tomorrow   LOS: 0 days   Estefana Keas, DO [facility]

## 2023-10-01 NOTE — H&P (View-Only) (Signed)
 [facility] Gastroenterology Consult  Referring Provider: No ref. provider found Primary Care Physician:  Elizbeth Leita Ruth, FNP Primary Gastroenterologist: [facility]  Reason for Consultation: Hematemesis  SUBJECTIVE:   HPI: [name] is a 48 y.o. female known to GI service who presented to hospital with chief complaint of hematemesis.  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 Postsurgical course has been complicated by marginal ulcer having undergone resection of gastric pouch, though continues to be complicated by marginal ulcer which was last noted on EGD 07/25/2023.  She has followed with [facility] and had laparoscopic remnant gastrostomy tube placement on 09/14/2023 to improve nutrition status.  She noted having multiple episodes (up to 20 episodes) of frank hematemesis prior to presentation.  Her last episode of hematemesis was yesterday.  She has not had further hematemesis in the emergency department.  She has significant abdominal pain located primarily in her lower abdomen.  Denies changes in bowel habits.  No chest pain or shortness of breath.  No fevers or chills though has been feeling subjectively cold/hot. She noted that her tube feeds were stopped roughly 30 minutes prior to my evaluation of her.  Labs on presentation showed hemoglobin 5.6, WBC 9.1, platelet 306, sodium 138, potassium 3.4, BUN/creatinine 21/0.43.  CT scan of abdomen pelvis on 10/01/2023 showed Roux-en-Y gastric bypass, gastrostomy tube and inflated balloon terminating within the bypassed gastric lumen, outpouching along bypassed gastric lumen with associated bowel wall thickening, perigastric fat stranding.  EGD 07/25/2023 for hematemesis and melena (Dr. Kriss) -showed oozing gastric ulcers with visible vessel status post bipolar cautery, jejunum within normal  limits.   Past Medical History:  Diagnosis Date   Anal fissure - posterior 10/16/2014    OCC  ISSUES   Anxiety    doesn't take anything   Asthma    has Albuterol  inhaler as needed   Bronchitis    in winter   Chronic headache disorder 07/22/2016   Clostridium difficile infection 04/20/2012   Dehydration    Depression    Family history of adverse reaction to anesthesia    pt mom gets sick   GERD (gastroesophageal reflux disease)    takes Pantoprazole  daily   Headache    Hiatal hernia    neuropathy - mild in arms and legs   History of blood transfusion    last transfusion was 04/04/2016=Benadryl  was given d/t itching. States she always itches with transfusion.    History of migraine    last one 05/01/16   History of stomach ulcers    History of urinary tract infection LAST 2 WEEKS AGO   IDA (iron  deficiency anemia)    Internal and external bleeding hemorrhoids 06/11/2014   Joint pain    Joint swelling    Left sided chronic colitis - segmental 06/11/2014   Marginal ulcer 10/25/2017   Motion sickness    Nausea    takes Zofran  as needed   Nausea and vomiting    for 1 year   Oligouria    Osteoarthritis    lower back, knees, wrists - no meds   Peripheral neuropathy 03/01/2019   Personal history of gastric bypass    Postoperative nausea and vomiting 01/21/2016   wants scopolamine  patch   Right carpal tunnel syndrome 03/01/2019   SVD (spontaneous vaginal delivery)    x 4   TOBACCO USER 10/02/2009   Qualifier: Diagnosis of  By: Lida  MD, Artist     Tremor 08/31/2018   UC (ulcerative colitis) (HCC)    supposed to be taking Lialda  and Bentyl  but has been off since gastric sleeve   Unsteady gait when walking 2024   uses a walker   Vertigo    doesn't take any meds   Past Surgical History:  Procedure Laterality Date   ABDOMINAL HYSTERECTOMY     PARTIAL   BIOPSY  01/20/2023   Procedure: BIOPSY;  Surgeon: Saintclair Jasper, MD;  Location: [facility];  Service: Gastroenterology;;    BLADDER STIMULATOR IMPLANT     COLONOSCOPY  2007   for rectal bleeding; [facility]   COLONOSCOPY N/A 06/11/2014   Procedure: COLONOSCOPY;  Surgeon: Lupita FORBES Commander, MD;  Location: [facility];  Service: Endoscopy;  Laterality: N/A;   COLONOSCOPY WITH PROPOFOL  N/A 03/02/2017   Procedure: COLONOSCOPY WITH PROPOFOL ;  Surgeon: Ethyl Lenis, MD;  Location: [facility];  Service: General;  Laterality: N/A;   DILATION AND CURETTAGE OF UTERUS     ESOPHAGOGASTRODUODENOSCOPY N/A 07/25/2023   Procedure: ESOPHAGOGASTRODUODENOSCOPY (EGD);  Surgeon: Kriss Estefana DEL, DO;  Location: [facility];  Service: Gastroenterology;  Laterality: N/A;   ESOPHAGOGASTRODUODENOSCOPY (EGD) WITH PROPOFOL  N/A 04/06/2016   Procedure: ESOPHAGOGASTRODUODENOSCOPY (EGD) WITH PROPOFOL ;  Surgeon: Lenis Ethyl, MD;  [facility]: [facility];  Service: General;  Laterality: N/A;   ESOPHAGOGASTRODUODENOSCOPY (EGD) WITH PROPOFOL  N/A 03/02/2017   Procedure: ESOPHAGOGASTRODUODENOSCOPY (EGD) WITH PROPOFOL ;  Surgeon: Ethyl Lenis, MD;  Location: [facility];  Service: General;  Laterality: N/A;   ESOPHAGOGASTRODUODENOSCOPY (EGD) WITH PROPOFOL  N/A 05/20/2017   Procedure: ESOPHAGOGASTRODUODENOSCOPY (EGD) WITH PROPOFOL  ERAS PATHWAY;  Surgeon: Ethyl Lenis, MD;  Location: [facility];  Service: General;  Laterality: N/A;   ESOPHAGOGASTRODUODENOSCOPY (EGD) WITH PROPOFOL  N/A 10/07/2017   Procedure: ESOPHAGOGASTRODUODENOSCOPY (EGD) WITH PROPOFOL ;  Surgeon: Ethyl Lenis, MD;  Location: [facility];  Service: General;  Laterality: N/A;   ESOPHAGOGASTRODUODENOSCOPY (EGD) WITH PROPOFOL  N/A 01/20/2023   Procedure: ESOPHAGOGASTRODUODENOSCOPY (EGD) WITH PROPOFOL ;  Surgeon: Saintclair Jasper, MD;  [facility]: [facility];  Service: Gastroenterology;  Laterality: N/A;   EXCISION OF SKIN TAG  06/08/2017   Procedure: EXCISION OF VULVAR SKIN TAGS X2;  Surgeon: Fredirick Glenys RAMAN, MD;  Location: [facility];  Service: Gynecology;;   FOOT SURGERY Bilateral    x 2    GASTRIC ROUX-EN-Y N/A 12/14/2016   Procedure: LAPAROSCOPIC REVISION SLEEVE GASTRECTOMY TO  ROUX-Y-GASTRIC BY-PASS, UPPER ENDO;  Surgeon: Morene Olives, MD;  Location: [facility];  Service: General;  Laterality: N/A;   GASTROJEJUNOSTOMY N/A 05/11/2016   Procedure: LAPAROSCOPIC PLACEMENT  OF FEEDING  JEJUNOSTOMY TUBE;  Surgeon: Morene Olives, MD;  [facility]: [facility];  Service: General;  Laterality: N/A;   HEMORRHOIDECTOMY WITH HEMORRHOID BANDING     HEMOSTASIS CLIP PLACEMENT  07/25/2023   Procedure: HEMOSTASIS CLIP PLACEMENT;  Surgeon: Kriss Estefana DEL, DO;  [facility]: [facility];  Service: Gastroenterology;;   HOT HEMOSTASIS N/A 07/25/2023   Procedure: HOT HEMOSTASIS (ARGON PLASMA COAGULATION/BICAP);  Surgeon: Kriss Estefana DEL, DO;  Location: [facility];  Service: Gastroenterology;  Laterality: N/A;   IR FLUORO GUIDE CV LINE RIGHT  04/06/2017   IR GASTROSTOMY TUBE MOD SED  08/25/2023   IR GENERIC HISTORICAL  05/19/2016   IR CM INJ ANY COLONIC TUBE W/FLUORO 05/19/2016 Marcey Moan, MD [facility]   IR PATIENT EVAL TECH 0-60 MINS  11/30/2017   IR REPLC DUODEN/JEJUNO TUBE PERCUT W/FLUORO  03/14/2018   IR REPLC DUODEN/JEJUNO TUBE PERCUT W/FLUORO  03/15/2018   IR US  GUIDE VASC ACCESS  LEFT  08/25/2023   IR US  GUIDE VASC ACCESS LEFT  08/25/2023   IR US  GUIDE VASC ACCESS RIGHT  04/06/2017   j tube removed march 2018     KNEE ARTHROSCOPY Left 09/06/2014   LAPAROSCOPIC GASTRIC SLEEVE RESECTION N/A 01/14/2016   Procedure: LAPAROSCOPIC GASTRIC SLEEVE RESECTION;  Surgeon: Morene Olives, MD;  [facility];  Service: General;  Laterality: N/A;   LAPAROSCOPIC INSERTION GASTROSTOMY TUBE N/A 09/14/2023   Procedure: LAPAROSCOPIC REMNANT GASTROSTOMY TUBE PLACEMENT;  Surgeon: Lyndel Deward PARAS, MD;  [facility]: [facility];  Service: General;  Laterality: N/A;   LAPAROSCOPIC REVISION OF GASTROJEJUNOSTOMY N/A 10/25/2017   Procedure: LAPAROSCOPIC REVISION OF GASTROJEJUNOSTOMY AND PARTIAL  GASTRECTOMY, WITH PLACEMENT OF FEEDING GASTROSTOMY TUBE;  Surgeon: Olives Morene, MD;  [facility]: [facility];  Service: General;  Laterality: N/A;   LAPAROSCOPIC TUBAL LIGATION  10/16/2011   Procedure: LAPAROSCOPIC TUBAL LIGATION;  Surgeon: Nena DELENA App, MD;  Location: [facility];  Service: Gynecology;  Laterality: Bilateral;   NOVASURE ABLATION  09/28/2010   mild persistent vaginal bleeding   right knee arthroscopy     05/07/2016   TOTAL KNEE ARTHROPLASTY Left 03/10/2018   Procedure: LEFT TOTAL KNEE ARTHROPLASTY;  Surgeon: Ernie Cough, MD;  [facility];  Service: Orthopedics;  Laterality: Left;  70 mins   TOTAL KNEE ARTHROPLASTY Right 01/23/2020   Procedure: TOTAL KNEE ARTHROPLASTY, BILATERAL TROCHANTERIC INJECTION;  Surgeon: Ernie Cough, MD;  Location: [facility];  Service: Orthopedics;  Laterality: Right;  70 mins for Bilateral Troch injection 1 cc Depo and 2 CC Lidocaine    VAGINAL HYSTERECTOMY Bilateral 06/08/2017   Procedure: HYSTERECTOMY VAGINAL W/ BILATERAL SALPINGECTOMY;  Surgeon: Fredirick Glenys RAMAN, MD;  Location: [facility];  Service: Gynecology;  Laterality: Bilateral;   wisdom teeth extracted     Prior to Admission medications   Medication Sig Start Date End Date Taking? Authorizing Provider  acetaminophen  (TYLENOL ) 500 MG tablet Take 2 tablets (1,000 mg total) by mouth every 6 (six) hours as needed. 09/16/23 09/15/24 Yes Stechschulte, Deward PARAS, MD  Atogepant  (QULIPTA ) 30 MG TABS Take 1 tablet (30 mg total) by mouth daily. 05/05/23  Yes Gayland Lauraine PARAS, NP  atorvastatin  (LIPITOR) 40 MG tablet Take 40 mg by mouth daily. 12/10/22  Yes [provider]  botulinum toxin Type A  (BOTOX ) 200 units injection INJECT 155 UNITS INTRAMUSCULARLY INTO HEAD AND NECK MUSCLES EVERY 3 MONTHS 11/23/22  Yes Onita Duos, MD  Buprenorphine  HCl (BELBUCA ) 75 MCG FILM Place 75 mcg under the tongue in the morning and at bedtime.   Yes [provider]  buPROPion  (WELLBUTRIN  XL) 300 MG 24 hr tablet Take  300 mg by mouth every morning. Take along with 150 mg=450 mg 06/05/20  Yes [provider]  busPIRone  (BUSPAR ) 15 MG tablet Take 15 mg by mouth 3 (three) times daily.   Yes [provider]  Cholecalciferol (VITAMIN D3) 1.25 MG (50000 UT) CAPS Take 50,000 Units by mouth every 7 (seven) days.  04/03/19  Yes [provider]  clonazePAM  (KLONOPIN ) 1 MG tablet Take 1 mg by mouth 2 (two) times daily as needed for anxiety.   Yes [provider]  cyanocobalamin  (,VITAMIN B-12,) 1000 MCG/ML injection Inject 1,000 mcg into the muscle 2 (two) times a week.  04/06/19  Yes [provider]  diclofenac  Sodium (VOLTAREN ) 1 % GEL Apply 2 g topically 4 (four) times daily as needed (Pain).   Yes [provider]  dicyclomine  (BENTYL ) 10 MG capsule Take 10 mg by  mouth 4 (four) times daily. 12/17/20  Yes [provider]  DULoxetine  (CYMBALTA ) 60 MG capsule Take 1 capsule (60 mg total) by mouth 2 (two) times daily. 04/10/19  Yes Wonda Clarita BRAVO, NP  EPINEPHrine  0.3 mg/0.3 mL IJ SOAJ injection Inject 0.3 mg into the muscle as needed for anaphylaxis. 04/12/21  Yes Eudelia Maude SAUNDERS, PA-C  famotidine  (PEPCID ) 20 MG tablet Take 20 mg by mouth 2 (two) times daily.   Yes [provider]  ferrous sulfate  325 (65 FE) MG tablet Take 1 tablet (325 mg total) by mouth daily with breakfast. 01/29/23  Yes Cheryle Page, MD  fluticasone  (FLONASE ) 50 MCG/ACT nasal spray Place 2 sprays into both nostrils daily as needed for allergies or rhinitis.   Yes [provider]  gabapentin  (NEURONTIN ) 600 MG tablet Take 600 mg by mouth 3 (three) times daily.   Yes [provider]  hydrocortisone  (ANUSOL -HC) 2.5 % rectal cream Place 1 Application rectally daily as needed for hemorrhoids. 07/29/18  Yes [provider]  HYDROmorphone  (DILAUDID ) 8 MG tablet Take 0.5 tablets (4 mg total) by mouth every 3 (three) hours. 03/08/23  Yes   linaclotide  (LINZESS ) 290 MCG  CAPS capsule Take 290 mcg by mouth daily before breakfast.   Yes [provider]  magnesium  oxide (MAG-OX) 400 (240 Mg) MG tablet Take 400 mg by mouth daily. 12/19/22  Yes [provider]  meclizine  (ANTIVERT ) 25 MG tablet Take 25 mg by mouth 3 (three) times daily. 04/15/22  Yes [provider]  mesalamine  (LIALDA ) 1.2 g EC tablet Take 2.4 g by mouth daily with breakfast.   Yes [provider]  methocarbamol  (ROBAXIN -750) 750 MG tablet Take 1 tablet (750 mg total) by mouth every 6 (six) hours as needed for muscle spasms. 09/16/23  Yes Stechschulte, Deward PARAS, MD  midodrine  (PROAMATINE ) 5 MG tablet Take 15 mg by mouth 3 (three) times daily with meals.   Yes [provider]  Multiple Vitamin (MULTIVITAMIN WITH MINERALS) TABS tablet Take 1 tablet by mouth daily.   Yes [provider]  NARCAN  4 MG/0.1ML LIQD nasal spray kit Place 1 spray into the nose daily as needed (overdose). 03/18/20  Yes [provider]  Nutritional Supplements (FEEDING SUPPLEMENT, OSMOLITE 1.5 CAL,) LIQD Place 1,000 mLs into feeding tube continuous. 09/16/23 12/15/23 Yes Stechschulte, Deward PARAS, MD  omeprazole  (KONVOMEP) 2 mg/mL SUSP oral suspension Take 10 mLs (20 mg total) by mouth 2 (two) times daily before a meal. Patient taking differently: Take 10 mLs by mouth 2 (two) times daily before a meal. 08/04/23 10/01/23 Yes Pahwani, Fredia, MD  ondansetron  (ZOFRAN ) 8 MG tablet Take 8 mg by mouth every 8 (eight) hours as needed for nausea or vomiting.   Yes [provider]  oxybutynin  (DITROPAN ) 5 MG tablet Take 10 mg by mouth at bedtime.   Yes [provider]  sucralfate  (CARAFATE ) 1 g tablet Take 1 tablet (1 g total) by mouth every 6 (six) hours. Patient taking differently: Take 1 g by mouth 4 (four) times daily. 01/21/23  Yes Caleen Qualia, MD  SUMAtriptan  (IMITREX ) 50 MG tablet Take 1 tablet (50 mg total) by mouth every 2 (two) hours as needed for migraine. May repeat  in 2 hours if headache persists or recurs. 05/05/23  Yes Gayland Lauraine PARAS, NP  SYMPROIC  0.2 MG TABS Take 0.2 mg by mouth daily. 05/13/21  Yes [provider]  thiamine  (VITAMIN B-1) 100 MG tablet Take 100 mg by mouth daily at  12 noon.    Yes [provider]  torsemide  (DEMADEX ) 5 MG tablet Take 5 mg by mouth daily. 11/10/21  Yes [provider]  valACYclovir  (VALTREX ) 1000 MG tablet Take 1,000 mg by mouth 2 (two) times daily as needed.   Yes [provider]  albuterol  (VENTOLIN  HFA) 108 (90 Base) MCG/ACT inhaler Inhale 2 puffs into the lungs every 6 (six) hours as needed for wheezing or shortness of breath.    [provider]   Current Facility-Administered Medications  Medication Dose Route Frequency Provider Last Rate Last Admin   Atogepant  TABS 30 mg  30 mg Oral Daily Celinda Alm Lot, MD       atorvastatin  (LIPITOR) tablet 40 mg  40 mg Oral q1800 Celinda Alm Lot, MD       Buprenorphine  HCl FILM 75 mcg  75 mcg Sublingual BID Celinda Alm Lot, MD       [START ON 10/02/2023] buPROPion  (WELLBUTRIN  XL) 24 hr tablet 450 mg  450 mg Oral q morning Celinda Alm Lot, MD       busPIRone  (BUSPAR ) tablet 15 mg  15 mg Oral TID Celinda Alm Lot, MD       clonazePAM  (KLONOPIN ) tablet 1 mg  1 mg Oral BID PRN Celinda Alm Lot, MD       DULoxetine  (CYMBALTA ) DR capsule 60 mg  60 mg Oral BID Celinda Alm Lot, MD       famotidine  (PEPCID ) tablet 20 mg  20 mg Oral BID Celinda Alm Lot, MD       fentaNYL  (SUBLIMAZE ) injection 25 mcg  25 mcg Intravenous Q2H PRN Howerter, Justin B, DO   25 mcg at 10/01/23 1041   gabapentin  (NEURONTIN ) capsule 600 mg  600 mg Oral TID Celinda Alm Lot, MD       HYDROmorphone  (DILAUDID ) tablet 4 mg  4 mg Oral Q3H Celinda Alm Lot, MD       [START ON 10/02/2023] linaclotide  (LINZESS ) capsule 290 mcg  290 mcg Oral QAC breakfast Celinda Alm Lot, MD       magnesium  oxide (MAG-OX) tablet 400 mg  400 mg Oral Daily Celinda Alm Lot, MD       [START ON 10/02/2023] mesalamine  (LIALDA ) EC tablet 2.4 g  2.4 g Oral Q breakfast Celinda Alm Lot, MD       methocarbamol  (ROBAXIN ) tablet 750 mg  750 mg Oral Q6H PRN Celinda Alm Lot, MD       midodrine  (PROAMATINE ) tablet 15 mg  15 mg Oral TID WC Celinda Alm Lot, MD       naloxone  (NARCAN ) injection 0.4 mg  0.4 mg Intravenous PRN Howerter, Justin B, DO       ondansetron  (ZOFRAN ) injection 4 mg  4 mg Intravenous Q6H PRN Howerter, Justin B, DO   4 mg at 10/01/23 9384   pantoprazole  (PROTONIX ) injection 40 mg  40 mg Intravenous Q12H Midge Golas, MD       sucralfate  (CARAFATE ) tablet 1 g  1 g Oral QID Celinda Alm Lot, MD       SUMAtriptan  (IMITREX ) tablet 50 mg  50 mg Oral Q2H PRN Celinda Alm Lot, MD       thiamine  (VITAMIN B1) tablet 100 mg  100 mg Oral Q1200 Celinda Alm Lot, MD       Current Outpatient Medications  Medication Sig Dispense Refill   acetaminophen  (TYLENOL ) 500 MG tablet Take 2 tablets (1,000 mg total) by mouth every 6 (six) hours as needed. 100  tablet 2   Atogepant  (QULIPTA ) 30 MG TABS Take 1 tablet (30 mg total) by mouth daily. 30 tablet 6   atorvastatin  (LIPITOR) 40 MG tablet Take 40 mg by mouth daily.     botulinum toxin Type A  (BOTOX ) 200 units injection INJECT 155 UNITS INTRAMUSCULARLY INTO HEAD AND NECK MUSCLES EVERY 3 MONTHS 1 each 3   Buprenorphine  HCl (BELBUCA ) 75 MCG FILM Place 75 mcg under the tongue in the morning and at bedtime.     buPROPion  (WELLBUTRIN  XL) 300 MG 24 hr tablet Take 300 mg by mouth every morning. Take along with 150 mg=450 mg     busPIRone  (BUSPAR ) 15 MG tablet Take 15 mg by mouth 3 (three) times daily.     Cholecalciferol (VITAMIN D3) 1.25 MG (50000 UT) CAPS Take 50,000 Units by mouth every 7 (seven) days.      clonazePAM  (KLONOPIN ) 1 MG tablet Take 1 mg by mouth 2 (two) times daily as needed for anxiety.     cyanocobalamin  (,VITAMIN B-12,) 1000 MCG/ML injection Inject 1,000 mcg into the muscle 2  (two) times a week.      diclofenac  Sodium (VOLTAREN ) 1 % GEL Apply 2 g topically 4 (four) times daily as needed (Pain).     dicyclomine  (BENTYL ) 10 MG capsule Take 10 mg by mouth 4 (four) times daily.     DULoxetine  (CYMBALTA ) 60 MG capsule Take 1 capsule (60 mg total) by mouth 2 (two) times daily. 60 capsule 0   EPINEPHrine  0.3 mg/0.3 mL IJ SOAJ injection Inject 0.3 mg into the muscle as needed for anaphylaxis. 1 each 0   famotidine  (PEPCID ) 20 MG tablet Take 20 mg by mouth 2 (two) times daily.     ferrous sulfate  325 (65 FE) MG tablet Take 1 tablet (325 mg total) by mouth daily with breakfast.     fluticasone  (FLONASE ) 50 MCG/ACT nasal spray Place 2 sprays into both nostrils daily as needed for allergies or rhinitis.     gabapentin  (NEURONTIN ) 600 MG tablet Take 600 mg by mouth 3 (three) times daily.     hydrocortisone  (ANUSOL -HC) 2.5 % rectal cream Place 1 Application rectally daily as needed for hemorrhoids.     HYDROmorphone  (DILAUDID ) 8 MG tablet Take 0.5 tablets (4 mg total) by mouth every 3 (three) hours. 120 tablet 0   linaclotide  (LINZESS ) 290 MCG CAPS capsule Take 290 mcg by mouth daily before breakfast.     magnesium  oxide (MAG-OX) 400 (240 Mg) MG tablet Take 400 mg by mouth daily.     meclizine  (ANTIVERT ) 25 MG tablet Take 25 mg by mouth 3 (three) times daily.     mesalamine  (LIALDA ) 1.2 g EC tablet Take 2.4 g by mouth daily with breakfast.     methocarbamol  (ROBAXIN -750) 750 MG tablet Take 1 tablet (750 mg total) by mouth every 6 (six) hours as needed for muscle spasms. 30 tablet 0   midodrine  (PROAMATINE ) 5 MG tablet Take 15 mg by mouth 3 (three) times daily with meals.     Multiple Vitamin (MULTIVITAMIN WITH MINERALS) TABS tablet Take 1 tablet by mouth daily.     NARCAN  4 MG/0.1ML LIQD nasal spray kit Place 1 spray into the nose daily as needed (overdose).     Nutritional Supplements (FEEDING SUPPLEMENT, OSMOLITE 1.5 CAL,) LIQD Place 1,000 mLs into feeding tube continuous. 29000  mL 2   omeprazole  (KONVOMEP) 2 mg/mL SUSP oral suspension Take 10 mLs (20 mg total) by mouth 2 (two) times daily before a meal. (  Patient taking differently: Take 10 mLs by mouth 2 (two) times daily before a meal.) 600 mL 0   ondansetron  (ZOFRAN ) 8 MG tablet Take 8 mg by mouth every 8 (eight) hours as needed for nausea or vomiting.     oxybutynin  (DITROPAN ) 5 MG tablet Take 10 mg by mouth at bedtime.     sucralfate  (CARAFATE ) 1 g tablet Take 1 tablet (1 g total) by mouth every 6 (six) hours. (Patient taking differently: Take 1 g by mouth 4 (four) times daily.) 120 tablet 1   SUMAtriptan  (IMITREX ) 50 MG tablet Take 1 tablet (50 mg total) by mouth every 2 (two) hours as needed for migraine. May repeat in 2 hours if headache persists or recurs. 10 tablet 6   SYMPROIC  0.2 MG TABS Take 0.2 mg by mouth daily.     thiamine  (VITAMIN B-1) 100 MG tablet Take 100 mg by mouth daily at 12 noon.      torsemide  (DEMADEX ) 5 MG tablet Take 5 mg by mouth daily.     valACYclovir  (VALTREX ) 1000 MG tablet Take 1,000 mg by mouth 2 (two) times daily as needed.     albuterol  (VENTOLIN  HFA) 108 (90 Base) MCG/ACT inhaler Inhale 2 puffs into the lungs every 6 (six) hours as needed for wheezing or shortness of breath.     Allergies as of 09/30/2023 - Review Complete 09/30/2023  Allergen Reaction Noted   Ciprofloxacin  Itching and Rash 04/20/2012   Coconut (cocos nucifera) Anaphylaxis and Itching 06/14/2017   Coconut oil Anaphylaxis, Itching, and Other (See Comments) 06/14/2017   Morphine  and codeine Anaphylaxis, Hives, and Other (See Comments) 09/02/2011   Oxycodone Anaphylaxis 09/02/2011   Oxycodone-acetaminophen  Anaphylaxis 10/08/2021   Doxycycline Nausea And Vomiting and Other (See Comments) 09/02/2011   Penicillamine Hives and Other (See Comments) 08/02/2018   Trazodone  and nefazodone Rash 04/10/2019   Vistaril  [hydroxyzine  hcl] Hives 04/10/2019   Silicone  06/11/2022   Penicillins Rash 09/02/2011   Tape Rash  01/31/2017   Family History  Problem Relation Age of Onset   Hypertension Mother    Diabetes Mother    Sarcoidosis Mother        lungs and skin   Asthma Mother    Hypertension Father    Diabetes Father    Asthma Son    Tics Son    Asthma Sister    Cancer Sister        possible pancreatic cancer   Adrenal disorder Sister        Tumor    Asthma Brother    Asthma Daughter        died age 303.5   Cancer Daughter 4       brain; died age 303.5   Asthma Son    Cancer Paternal Aunt        brain, colon, lung and esophagus; unsure of primary    Breast cancer Paternal Aunt    Social History   Socioeconomic History   Marital status: Single    Spouse name: Not on file   Number of children: 3   Years of education: Some college   Highest education level: Not on file  Occupational History   Occupation: English As A Second Language Teacher    Comment: Currently out on disability d/t surg  Tobacco Use   Smoking status: Former    Current packs/day: 0.00    Average packs/day: 0.5 packs/day for 20.0 years (10.0 ttl pk-yrs)    Types: Cigarettes    Start date: 03/17/1994  Quit date: 03/17/2014    Years since quitting: 9.5   Smokeless tobacco: Never  Vaping Use   Vaping status: Never Used  Substance and Sexual Activity   Alcohol use: Not Currently   Drug use: No   Sexual activity: Not Currently    Birth control/protection: Surgical, Abstinence  Other Topics Concern   Not on file  Social History Narrative   Lives with 2 sons   Right Handed   Drinks caffeine2-3 cups daily   Social Drivers of Health   Financial Resource Strain: Low Risk  (04/08/2019)   Overall Financial Resource Strain (CARDIA)    Difficulty of Paying Living Expenses: Not hard at all  Food Insecurity: Food Insecurity Present (09/14/2023)   Hunger Vital Sign    Worried About Running Out of Food in the Last Year: Sometimes true    Ran Out of Food in the Last Year: Sometimes true  Transportation Needs: No Transportation Needs  (09/14/2023)   PRAPARE - Administrator, Civil Service (Medical): No    Lack of Transportation (Non-Medical): No  Physical Activity: Inactive (04/08/2019)   Exercise Vital Sign    Days of Exercise per Week: 0 days    Minutes of Exercise per Session: 0 min  Stress: Stress Concern Present (04/08/2019)   [employer] - Occupational Stress Questionnaire    Feeling of Stress : To some extent  Social Connections: Unknown (01/29/2022)   Received from [facility]   Social Network    Social Network: Not on file  Intimate Partner Violence: Not At Risk (09/14/2023)   Humiliation, Afraid, Rape, and Kick questionnaire    Fear of Current or Ex-Partner: No    Emotionally Abused: No    Physically Abused: No    Sexually Abused: No   Review of Systems:  Review of Systems  Respiratory:  Negative for shortness of breath.   Cardiovascular:  Negative for chest pain.  Gastrointestinal:  Positive for abdominal pain, nausea and vomiting. Negative for blood in stool, constipation and diarrhea.    OBJECTIVE:   Temp:  [97.5 F (36.4 C)-98.7 F (37.1 C)] 98.5 F (36.9 C) (01/03 1040) Pulse Rate:  [74-101] 78 (01/03 1040) Resp:  [14-18] 16 (01/03 1040) BP: (95-128)/(70-80) 109/77 (01/03 1040) SpO2:  [100 %] 100 % (01/03 1040)   Physical Exam Constitutional:      General: She is not in acute distress.    Appearance: She is not ill-appearing, toxic-appearing or diaphoretic.  Cardiovascular:     Rate and Rhythm: Normal rate and regular rhythm.  Pulmonary:     Effort: No respiratory distress.     Breath sounds: Normal breath sounds.  Abdominal:     General: Bowel sounds are normal. There is no distension.     Palpations: Abdomen is soft.     Tenderness: There is no abdominal tenderness. There is no guarding.     Comments: PEG tube noted, no tube feeds running.  Neurological:     Mental Status: She is alert.     Labs: Recent Labs     09/30/23 2322 10/01/23 0450  WBC 14.2* 9.1  HGB 5.6* 5.4*  HCT 18.1* 16.9*  PLT 442* 306   BMET Recent Labs    09/30/23 2322 10/01/23 0450  NA 135 138  K 3.6 3.4*  CL 105 109  CO2 23 25  GLUCOSE 125* 101*  BUN 23* 21*  CREATININE 0.73 0.43*  CALCIUM  9.2 8.3*   LFT Recent  Labs    10/01/23 0450  PROT 5.6*  ALBUMIN  3.0*  AST 17  ALT 14  ALKPHOS 38  BILITOT 0.3   PT/INR No results for input(s): LABPROT, INR in the last 72 hours.  Diagnostic imaging: CT ABDOMEN PELVIS W CONTRAST Result Date: 10/01/2023 CLINICAL DATA:  Abdominal pain, acute, nonlocalized hematemesis EXAM: CT ABDOMEN AND PELVIS WITH CONTRAST TECHNIQUE: Multidetector CT imaging of the abdomen and pelvis was performed using the standard protocol following bolus administration of intravenous contrast. RADIATION DOSE REDUCTION: This exam was performed according to the departmental dose-optimization program which includes automated exposure control, adjustment of the mA and/or kV according to patient size and/or use of iterative reconstruction technique. CONTRAST:  OMNIPAQUE  IOHEXOL  300 MG/ML  SOLN COMPARISON:  CT abdomen pelvis 01/17/2023, CT abdomen pelvis 01/30/2017, CT abdomen pelvis 11/19/2017, CT abdomen pelvis 12/11/2020 FINDINGS: Lower chest: Tiny hiatal hernia.  No acute abnormality. Hepatobiliary: No focal liver abnormality. No gallstones, gallbladder wall thickening, or pericholecystic fluid. No biliary dilatation. Pancreas: No focal lesion. Normal pancreatic contour. No surrounding inflammatory changes. No main pancreatic ductal dilatation. Spleen: Normal in size. There is a lobulated 2.1 cm hypodense lesion within the spleen with a density of 37 Hounsfield unit. Finding has been increasing in size since 11/19/2017. New from 01/30/2017. Adrenals/Urinary Tract: No adrenal nodule bilaterally. Bilateral kidneys enhance symmetrically. No hydronephrosis. No hydroureter. The urinary bladder is unremarkable.  Stomach/Bowel: Roux-en-Y gastric bypass. Gastrostomy tube with tip and inflated balloon terminating within the bypassed gastric lumen. Outpouching along the bypassed gastric lumen with associated bowel wall thickening and adjacent non enhancement of the gastric wall with associated perigastric fat stranding (8:42). Stomach is within normal limits. No evidence of bowel wall thickening or dilatation. Appendix appears normal. Vascular/Lymphatic: No abdominal aorta or iliac aneurysm. No abdominal, pelvic, or inguinal lymphadenopathy. Reproductive: Status post hysterectomy. No adnexal masses. Other: No intraperitoneal free fluid. No intraperitoneal free gas. No organized fluid collection. Musculoskeletal: No abdominal wall hernia or abnormality. No suspicious lytic or blastic osseous lesions. No acute displaced fracture. IMPRESSION: 1. 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 No definite perforation. 2. Gastrostomy tube in appropriate position within the bypassed gastric lumen. 3. Tiny hiatal hernia. 4. Indeterminate slow growing splenic lesion now measuring 2.1 cm. Electronically Signed   By: Morgane  Naveau M.D.   On: 10/01/2023 03:23   CT Head Wo Contrast Result Date: 10/01/2023 CLINICAL DATA:  Head trauma, moderate-severe; Neck trauma, midline tenderness (Age 27-64y) EXAM: CT HEAD WITHOUT CONTRAST CT CERVICAL SPINE WITHOUT CONTRAST TECHNIQUE: Multidetector CT imaging of the head and cervical spine was performed following the standard protocol without intravenous contrast. Multiplanar CT image reconstructions of the cervical spine were also generated. RADIATION DOSE REDUCTION: This exam was performed according to the departmental dose-optimization program which includes automated exposure control, adjustment of the mA and/or kV according to patient size and/or use of iterative reconstruction technique. COMPARISON:  CT head and C-spine 02/28/2023 FINDINGS: CT HEAD FINDINGS Brain: No evidence of  large-territorial acute infarction. No parenchymal hemorrhage. No mass lesion. No extra-axial collection. Similar nonspecific calcification of the cerebellum. No mass effect or midline shift. No hydrocephalus. Basilar cisterns are patent. Vascular: No hyperdense vessel. Skull: No acute fracture or focal lesion. Sinuses/Orbits: Paranasal sinuses and mastoid air cells are clear. The orbits are unremarkable. Other: None. CT CERVICAL SPINE FINDINGS Alignment: Normal. Skull base and vertebrae: No acute fracture. No aggressive appearing focal osseous lesion or focal pathologic process. Soft tissues and spinal canal: No prevertebral fluid or  swelling. No visible canal hematoma. Upper chest: Unremarkable. Other: None. IMPRESSION: 1. No acute intracranial abnormality. 2. No acute displaced fracture or traumatic listhesis of the cervical spine. Electronically Signed   By: Morgane  Naveau M.D.   On: 10/01/2023 02:51   CT Cervical Spine Wo Contrast Result Date: 10/01/2023 CLINICAL DATA:  Head trauma, moderate-severe; Neck trauma, midline tenderness (Age 22-64y) EXAM: CT HEAD WITHOUT CONTRAST CT CERVICAL SPINE WITHOUT CONTRAST TECHNIQUE: Multidetector CT imaging of the head and cervical spine was performed following the standard protocol without intravenous contrast. Multiplanar CT image reconstructions of the cervical spine were also generated. RADIATION DOSE REDUCTION: This exam was performed according to the departmental dose-optimization program which includes automated exposure control, adjustment of the mA and/or kV according to patient size and/or use of iterative reconstruction technique. COMPARISON:  CT head and C-spine 02/28/2023 FINDINGS: CT HEAD FINDINGS Brain: No evidence of large-territorial acute infarction. No parenchymal hemorrhage. No mass lesion. No extra-axial collection. Similar nonspecific calcification of the cerebellum. No mass effect or midline shift. No hydrocephalus. Basilar cisterns are patent.  Vascular: No hyperdense vessel. Skull: No acute fracture or focal lesion. Sinuses/Orbits: Paranasal sinuses and mastoid air cells are clear. The orbits are unremarkable. Other: None. CT CERVICAL SPINE FINDINGS Alignment: Normal. Skull base and vertebrae: No acute fracture. No aggressive appearing focal osseous lesion or focal pathologic process. Soft tissues and spinal canal: No prevertebral fluid or swelling. No visible canal hematoma. Upper chest: Unremarkable. Other: None. IMPRESSION: 1. No acute intracranial abnormality. 2. No acute displaced fracture or traumatic listhesis of the cervical spine. Electronically Signed   By: Morgane  Naveau M.D.   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 Protein calorie malnutrition  -S/p laparoscopic remnant gastrostomy tube placement 09/14/23   PLAN: -Recommend continue surgical management for treatment resistant marginal ulceration -Recommend EGD to further evaluate marginal ulcer and provide any hemostatic treatment as appropriate, discussed this with patient including benefits/alternatives and risks of bleeding, infection, perforation, missed lesion, anesthesia, she verbalized understanding and elected to proceed -She requires further resuscitation to maintain Hgb > 7 prior to EGD procedure, receiving blood transfusion, trend H/H today and tomorrow AM -Continue IV PPI Q12Hr -Continue sucralfate  suspension 1 gm PO QID -NPO at midnight for possible EGD with Dr. Burnette tomorrow   LOS: 0 days   Estefana Keas, DO [facility]

## 2023-10-01 NOTE — ED Notes (Signed)
 [place] PIV placed, pt ready for CT.

## 2023-10-01 NOTE — H&P (Signed)
 History and Physical    Patient: [name] FMW:[id number] DOB: 1976-01-26 DOA: 09/30/2023 DOS: the patient was seen and examined on 10/01/2023 PCP: Elizbeth Leita Ruth, FNP  Patient coming from: Home  Chief Complaint:  Chief Complaint  Patient presents with   Hematemesis   Fall   HPI: [name] is a 48 y.o. female with medical history significant of posterior anal fissure, anxiety, asthma, bronchitis, chronic headaches, history of C. difficile infection, dehydration, depression, GERD, hiatal hernia, history of UTIs, joint swelling/pain, peripheral neuropathy, postoperative nausea and vomiting, right carpal/tunnel syndrome, ulcerative colitis, tobacco use, vertigo, history of PUD who presented to the emergency department complaints of having a fall with after multiple episodes of emesis complicated by several episodes of hematemesis yesterday.  No diarrhea, constipation, melena or hematochezia.  No flank pain, dysuria, frequency or hematuria.She denied fever, chills, rhinorrhea, sore throat, wheezing or hemoptysis.  No chest pain, palpitations,  PND, orthopnea or pitting edema of the lower extremities.  No polyuria, polydipsia, polyphagia or blurred vision.   Lab work: CBC showed white count 14.2, hemoglobin 5.8 g deciliter platelets 442.  CMP showed a glucose of 125 and BUN 123 mg/dL, but was otherwise unremarkable.  Follow-up hemoglobin level after 2 PRBC units transfusion 6.3 g/dL.  Imaging: CT abdomen/pelvis with contrast showing previous growth and Y bypass with marginal ulcer formation.  Gastrostomy tube is in appropriate position within the bypass gastric lumen.  Tiny hiatal hernia.  Indeterminate slow-growing splenic lesion now measuring 2.1 cm.  CT head without contrast no acute intracranial normality.  CT cervical spine with no acute fracture or traumatic listhesis of the cervical spine.  ED course: Initial vital signs were temperature 97.6 F, pulse 71, respiration 17, BP  128/80 mmHg and O2 sat 100% on room air.  Patient received fentanyl  50 mcg IV, ondansetron  4 mg IM, given pantoprazole  loading dose of 80 mg IVP and 1000 mL normal saline bolus.   Review of Systems: As mentioned in the history of present illness. All other systems reviewed and are negative. Past Medical History:  Diagnosis Date   Anal fissure - posterior 10/16/2014    OCC  ISSUES   Anxiety    doesn't take anything   Asthma    has Albuterol  inhaler as needed   Bronchitis    in winter   Chronic headache disorder 07/22/2016   Clostridium difficile infection 04/20/2012   Dehydration    Depression    Family history of adverse reaction to anesthesia    pt mom gets sick   GERD (gastroesophageal reflux disease)    takes Pantoprazole  daily   Headache    Hiatal hernia    neuropathy - mild in arms and legs   History of blood transfusion    last transfusion was 04/04/2016=Benadryl  was given d/t itching. States she always itches with transfusion.    History of migraine    last one 05/01/16   History of stomach ulcers    History of urinary tract infection LAST 2 WEEKS AGO   IDA (iron  deficiency anemia)    Internal and external bleeding hemorrhoids 06/11/2014   Joint pain    Joint swelling    Left sided chronic colitis - segmental 06/11/2014   Marginal ulcer 10/25/2017   Motion sickness    Nausea    takes Zofran  as needed   Nausea and vomiting    for 1 year   Oligouria    Osteoarthritis    lower back, knees, wrists - no  meds   Peripheral neuropathy 03/01/2019   Personal history of gastric bypass    Postoperative nausea and vomiting 01/21/2016   wants scopolamine  patch   Right carpal tunnel syndrome 03/01/2019   SVD (spontaneous vaginal delivery)    x 4   TOBACCO USER 10/02/2009   Qualifier: Diagnosis of  By: Lida MD, Artist     Tremor 08/31/2018   UC (ulcerative colitis) (HCC)    supposed to be taking Lialda  and Bentyl  but has been off since gastric sleeve   Unsteady gait when  walking 2024   uses a walker   Vertigo    doesn't take any meds   Past Surgical History:  Procedure Laterality Date   ABDOMINAL HYSTERECTOMY     PARTIAL   BIOPSY  01/20/2023   Procedure: BIOPSY;  Surgeon: Saintclair Jasper, MD;  Location: [facility];  Service: Gastroenterology;;   BLADDER STIMULATOR IMPLANT     COLONOSCOPY  2007   for rectal bleeding; [facility]   COLONOSCOPY N/A 06/11/2014   Procedure: COLONOSCOPY;  Surgeon: Lupita FORBES Commander, MD;  Location: [facility];  Service: Endoscopy;  Laterality: N/A;   COLONOSCOPY WITH PROPOFOL  N/A 03/02/2017   Procedure: COLONOSCOPY WITH PROPOFOL ;  Surgeon: Ethyl Lenis, MD;  Location: [facility];  Service: General;  Laterality: N/A;   DILATION AND CURETTAGE OF UTERUS     ESOPHAGOGASTRODUODENOSCOPY N/A 07/25/2023   Procedure: ESOPHAGOGASTRODUODENOSCOPY (EGD);  Surgeon: Kriss Estefana DEL, DO;  Location: [facility];  Service: Gastroenterology;  Laterality: N/A;   ESOPHAGOGASTRODUODENOSCOPY (EGD) WITH PROPOFOL  N/A 04/06/2016   Procedure: ESOPHAGOGASTRODUODENOSCOPY (EGD) WITH PROPOFOL ;  Surgeon: Lenis Ethyl, MD;  Location: [facility];  Service: General;  Laterality: N/A;   ESOPHAGOGASTRODUODENOSCOPY (EGD) WITH PROPOFOL  N/A 03/02/2017   Procedure: ESOPHAGOGASTRODUODENOSCOPY (EGD) WITH PROPOFOL ;  Surgeon: Ethyl Lenis, MD;  Location: [facility];  Service: General;  Laterality: N/A;   ESOPHAGOGASTRODUODENOSCOPY (EGD) WITH PROPOFOL  N/A 05/20/2017   Procedure: ESOPHAGOGASTRODUODENOSCOPY (EGD) WITH PROPOFOL  ERAS PATHWAY;  Surgeon: Ethyl Lenis, MD;  Location: [facility];  Service: General;  Laterality: N/A;   ESOPHAGOGASTRODUODENOSCOPY (EGD) WITH PROPOFOL  N/A 10/07/2017   Procedure: ESOPHAGOGASTRODUODENOSCOPY (EGD) WITH PROPOFOL ;  Surgeon: Ethyl Lenis, MD;  Location: [facility];  Service: General;  Laterality: N/A;   ESOPHAGOGASTRODUODENOSCOPY (EGD) WITH PROPOFOL  N/A 01/20/2023   Procedure: ESOPHAGOGASTRODUODENOSCOPY (EGD) WITH PROPOFOL ;   Surgeon: Saintclair Jasper, MD;  Location: [facility];  Service: Gastroenterology;  Laterality: N/A;   EXCISION OF SKIN TAG  06/08/2017   Procedure: EXCISION OF VULVAR SKIN TAGS X2;  Surgeon: Fredirick Glenys RAMAN, MD;  Location: [facility];  Service: Gynecology;;   FOOT SURGERY Bilateral    x 2   GASTRIC ROUX-EN-Y N/A 12/14/2016   Procedure: LAPAROSCOPIC REVISION SLEEVE GASTRECTOMY TO  ROUX-Y-GASTRIC BY-PASS, UPPER ENDO;  Surgeon: Morene Olives, MD;  Location: [facility];  Service: General;  Laterality: N/A;   GASTROJEJUNOSTOMY N/A 05/11/2016   Procedure: LAPAROSCOPIC PLACEMENT  OF FEEDING  JEJUNOSTOMY TUBE;  Surgeon: Morene Olives, MD;  Location: [facility];  Service: General;  Laterality: N/A;   HEMORRHOIDECTOMY WITH HEMORRHOID BANDING     HEMOSTASIS CLIP PLACEMENT  07/25/2023   Procedure: HEMOSTASIS CLIP PLACEMENT;  Surgeon: Kriss Estefana DEL, DO;  Location: [facility];  Service: Gastroenterology;;   HOT HEMOSTASIS N/A 07/25/2023   Procedure: HOT HEMOSTASIS (ARGON PLASMA COAGULATION/BICAP);  Surgeon: Kriss Estefana DEL, DO;  Location: [facility];  Service: Gastroenterology;  Laterality: N/A;   IR FLUORO GUIDE CV LINE RIGHT  04/06/2017   IR GASTROSTOMY TUBE MOD SED  08/25/2023  IR GENERIC HISTORICAL  05/19/2016   IR CM INJ ANY COLONIC TUBE W/FLUORO 05/19/2016 Marcey Moan, MD [facility]   IR PATIENT EVAL TECH 0-60 MINS  11/30/2017   IR REPLC DUODEN/JEJUNO TUBE PERCUT W/FLUORO  03/14/2018   IR REPLC DUODEN/JEJUNO TUBE PERCUT W/FLUORO  03/15/2018   IR US  GUIDE VASC ACCESS LEFT  08/25/2023   IR US  GUIDE VASC ACCESS LEFT  08/25/2023   IR US  GUIDE VASC ACCESS RIGHT  04/06/2017   j tube removed march 2018     KNEE ARTHROSCOPY Left 09/06/2014   LAPAROSCOPIC GASTRIC SLEEVE RESECTION N/A 01/14/2016   Procedure: LAPAROSCOPIC GASTRIC SLEEVE RESECTION;  Surgeon: Morene Olives, MD;  Location: [facility];  Service: General;  Laterality: N/A;   LAPAROSCOPIC INSERTION GASTROSTOMY TUBE N/A 09/14/2023    Procedure: LAPAROSCOPIC REMNANT GASTROSTOMY TUBE PLACEMENT;  Surgeon: Lyndel Deward PARAS, MD;  Location: [facility];  Service: General;  Laterality: N/A;   LAPAROSCOPIC REVISION OF GASTROJEJUNOSTOMY N/A 10/25/2017   Procedure: LAPAROSCOPIC REVISION OF GASTROJEJUNOSTOMY AND PARTIAL GASTRECTOMY, WITH PLACEMENT OF FEEDING GASTROSTOMY TUBE;  Surgeon: Olives Morene, MD;  Location: [facility];  Service: General;  Laterality: N/A;   LAPAROSCOPIC TUBAL LIGATION  10/16/2011   Procedure: LAPAROSCOPIC TUBAL LIGATION;  Surgeon: Nena DELENA App, MD;  Location: [facility];  Service: Gynecology;  Laterality: Bilateral;   NOVASURE ABLATION  09/28/2010   mild persistent vaginal bleeding   right knee arthroscopy     05/07/2016   TOTAL KNEE ARTHROPLASTY Left 03/10/2018   Procedure: LEFT TOTAL KNEE ARTHROPLASTY;  Surgeon: Ernie Cough, MD;  Location: [facility];  Service: Orthopedics;  Laterality: Left;  70 mins   TOTAL KNEE ARTHROPLASTY Right 01/23/2020   Procedure: TOTAL KNEE ARTHROPLASTY, BILATERAL TROCHANTERIC INJECTION;  Surgeon: Ernie Cough, MD;  Location: [facility];  Service: Orthopedics;  Laterality: Right;  70 mins for Bilateral Troch injection 1 cc Depo and 2 CC Lidocaine    VAGINAL HYSTERECTOMY Bilateral 06/08/2017   Procedure: HYSTERECTOMY VAGINAL W/ BILATERAL SALPINGECTOMY;  Surgeon: Fredirick Glenys RAMAN, MD;  Location: [facility];  Service: Gynecology;  Laterality: Bilateral;   wisdom teeth extracted     Social History:  reports that she quit smoking about 9 years ago. Her smoking use included cigarettes. She started smoking about 29 years ago. She has a 10 pack-year smoking history. She has never used smokeless tobacco. She reports that she does not currently use alcohol. She reports that she does not use drugs.  Allergies  Allergen Reactions   Ciprofloxacin  Itching and Rash   Coconut (Cocos Nucifera) Anaphylaxis and Itching   Coconut Oil Anaphylaxis, Itching and Other (See Comments)   Morphine  And Codeine  Anaphylaxis, Hives and Other (See Comments)    Pt states that she has tolerated Norco and Dilaudid    Oxycodone Anaphylaxis   Oxycodone-Acetaminophen  Anaphylaxis   Doxycycline Nausea And Vomiting and Other (See Comments)   Penicillamine Hives and Other (See Comments)   Trazodone  And Nefazodone Rash   Vistaril  [Hydroxyzine  Hcl] Hives   Silicone    Penicillins Rash    Has patient had a PCN reaction causing immediate rash, facial/tongue/throat swelling, SOB or lightheadedness with hypotension: Yes Has patient had a PCN reaction causing severe rash involving mucus membranes or skin necrosis: No Has patient had a PCN reaction that required hospitalization No Has patient had a PCN reaction occurring within the last 10 years: No If all of the above answers are NO, then may proceed with Cephalosporin use.   Tape Rash    And paper tape  causes a rash if wearing for a prolong period of time    Family History  Problem Relation Age of Onset   Hypertension Mother    Diabetes Mother    Sarcoidosis Mother        lungs and skin   Asthma Mother    Hypertension Father    Diabetes Father    Asthma Son    Tics Son    Asthma Sister    Cancer Sister        possible pancreatic cancer   Adrenal disorder Sister        Tumor    Asthma Brother    Asthma Daughter        died age 24.5   Cancer Daughter 4       brain; died age 24.5   Asthma Son    Cancer Paternal Aunt        brain, colon, lung and esophagus; unsure of primary    Breast cancer Paternal Aunt     Prior to Admission medications   Medication Sig Start Date End Date Taking? Authorizing Provider  acetaminophen  (TYLENOL ) 500 MG tablet Take 2 tablets (1,000 mg total) by mouth every 6 (six) hours as needed. 09/16/23 09/15/24  Stechschulte, Deward PARAS, MD  albuterol  (VENTOLIN  HFA) 108 (90 Base) MCG/ACT inhaler Inhale 2 puffs into the lungs every 6 (six) hours as needed for wheezing or shortness of breath.    [provider]  Atogepant   (QULIPTA ) 30 MG TABS Take 1 tablet (30 mg total) by mouth daily. 05/05/23   Gayland Lauraine PARAS, NP  atorvastatin  (LIPITOR) 40 MG tablet Take 40 mg by mouth daily. 12/10/22   [provider]  botulinum toxin Type A  (BOTOX ) 200 units injection INJECT 155 UNITS INTRAMUSCULARLY INTO HEAD AND NECK MUSCLES EVERY 3 MONTHS 11/23/22   Onita Duos, MD  buPROPion  (WELLBUTRIN  XL) 300 MG 24 hr tablet Take 300 mg by mouth every morning. Take along with 150 mg=450 mg 06/05/20   [provider]  busPIRone  (BUSPAR ) 15 MG tablet Take 15 mg by mouth 3 (three) times daily.    [provider]  Cholecalciferol (VITAMIN D3) 1.25 MG (50000 UT) CAPS Take 50,000 Units by mouth every 7 (seven) days.  04/03/19   [provider]  clonazePAM  (KLONOPIN ) 1 MG tablet Take 1 mg by mouth 2 (two) times daily as needed for anxiety.    [provider]  cyanocobalamin  (,VITAMIN B-12,) 1000 MCG/ML injection Inject 1,000 mcg into the muscle 2 (two) times a week.  04/06/19   [provider]  diclofenac  Sodium (VOLTAREN ) 1 % GEL Apply 2 g topically 4 (four) times daily as needed (Pain).    [provider]  dicyclomine  (BENTYL ) 10 MG capsule Take 10 mg by mouth 4 (four) times daily. 12/17/20   [provider]  DULoxetine  (CYMBALTA ) 60 MG capsule Take 1 capsule (60 mg total) by mouth 2 (two) times daily. 04/10/19   Wonda Clarita BRAVO, NP  EPINEPHrine  0.3 mg/0.3 mL IJ SOAJ injection Inject 0.3 mg into the muscle as needed for anaphylaxis. 04/12/21   Eudelia Maude SAUNDERS, PA-C  famotidine  (PEPCID ) 20 MG tablet Take 20 mg by mouth 2 (two) times daily.    [provider]  ferrous sulfate  325 (65 FE) MG tablet Take 1 tablet (325 mg total) by mouth daily with breakfast. 01/29/23   Cheryle Page, MD  fluticasone  (FLONASE ) 50 MCG/ACT nasal spray Place 2 sprays into both nostrils daily as needed for allergies  or rhinitis.    [provider]  gabapentin  (NEURONTIN ) 600 MG tablet Take 600  mg by mouth 3 (three) times daily.    [provider]  hydrocortisone  (ANUSOL -HC) 2.5 % rectal cream Place 1 Application rectally daily as needed for hemorrhoids. 07/29/18   [provider]  HYDROmorphone  (DILAUDID ) 8 MG tablet Take 0.5 tablets (4 mg total) by mouth every 3 (three) hours. 03/08/23     linaclotide  (LINZESS ) 290 MCG CAPS capsule Take 290 mcg by mouth daily before breakfast.    [provider]  magnesium  oxide (MAG-OX) 400 (240 Mg) MG tablet Take 400 mg by mouth daily. 12/19/22   [provider]  meclizine  (ANTIVERT ) 25 MG tablet Take 25 mg by mouth 3 (three) times daily. 04/15/22   [provider]  mesalamine  (LIALDA ) 1.2 g EC tablet Take 2.4 g by mouth daily with breakfast.    [provider]  methocarbamol  (ROBAXIN -750) 750 MG tablet Take 1 tablet (750 mg total) by mouth every 6 (six) hours as needed for muscle spasms. 09/16/23   Stechschulte, Deward PARAS, MD  midodrine  (PROAMATINE ) 5 MG tablet Take 15 mg by mouth 3 (three) times daily with meals.    [provider]  Multiple Vitamin (MULTIVITAMIN WITH MINERALS) TABS tablet Take 1 tablet by mouth daily.    [provider]  NARCAN  4 MG/0.1ML LIQD nasal spray kit Place 1 spray into the nose daily as needed (overdose). 03/18/20   [provider]  Nutritional Supplements (FEEDING SUPPLEMENT, OSMOLITE 1.5 CAL,) LIQD Place 1,000 mLs into feeding tube continuous. 09/16/23 12/15/23  Stechschulte, Deward PARAS, MD  omeprazole  (KONVOMEP) 2 mg/mL SUSP oral suspension Take 10 mLs (20 mg total) by mouth 2 (two) times daily before a meal. Patient taking differently: Take 10 mLs by mouth 2 (two) times daily before a meal. 08/04/23 09/14/23  Vernon Ranks, MD  ondansetron  (ZOFRAN ) 8 MG tablet Take 8 mg by mouth every 8 (eight) hours as needed for nausea or vomiting.    [provider]  oxybutynin  (DITROPAN ) 5 MG tablet Take 10 mg by mouth at bedtime.    [provider]   sucralfate  (CARAFATE ) 1 g tablet Take 1 tablet (1 g total) by mouth every 6 (six) hours. Patient taking differently: Take 1 g by mouth 4 (four) times daily. 01/21/23   Amin, Sumayya, MD  SUMAtriptan  (IMITREX ) 50 MG tablet Take 1 tablet (50 mg total) by mouth every 2 (two) hours as needed for migraine. May repeat in 2 hours if headache persists or recurs. 05/05/23   Gayland Lauraine PARAS, NP  SYMPROIC  0.2 MG TABS Take 0.2 mg by mouth daily. 05/13/21   [provider]  thiamine  (VITAMIN B-1) 100 MG tablet Take 100 mg by mouth daily at 12 noon.     [provider]  torsemide  (DEMADEX ) 5 MG tablet Take 5 mg by mouth daily. 11/10/21   [provider]  valACYclovir  (VALTREX ) 1000 MG tablet Take 1,000 mg by mouth 2 (two) times daily as needed.    [provider]    Physical Exam: Vitals:   10/01/23 9663 10/01/23 9367 10/01/23 0632 10/01/23 0743  BP: 111/70  114/72 116/71  Pulse: 80  74 81  Resp: 16  16 18   Temp: (!) 97.5 F (36.4 C) 98 F (36.7 C) 98 F (36.7 C) 98.7 F (37.1 C)  TempSrc: Oral Oral Oral Oral  SpO2: 100%  100%    Physical Exam Vitals and nursing note reviewed.  Constitutional:      General: She is awake. She is not in acute distress.    Appearance: Normal appearance. She is ill-appearing.  HENT:     Head: Normocephalic.     Nose: No rhinorrhea.     Mouth/Throat:     Mouth: Mucous membranes are moist.  Eyes:     General: No scleral icterus.    Pupils: Pupils are equal, round, and reactive to light.     Comments: Pale conjunctiva.  Neck:     Vascular: No JVD.  Cardiovascular:     Rate and Rhythm: Normal rate and regular rhythm.     Heart sounds: S1 normal and S2 normal.  Pulmonary:     Effort: Pulmonary effort is normal.     Breath sounds: Normal breath sounds.  Abdominal:     General: Bowel sounds are normal.     Palpations: Abdomen is soft.  Musculoskeletal:     Cervical back: Neck supple.     Right lower leg: No edema.     Left  lower leg: No edema.  Skin:    General: Skin is warm and dry.     Coloration: Skin is pale.  Neurological:     General: No focal deficit present.     Mental Status: She is alert and oriented to person, place, and time.  Psychiatric:        Mood and Affect: Mood normal.        Behavior: Behavior normal. Behavior is cooperative.     Data Reviewed:  Results are pending, will review when available.  Assessment and Plan: Principal Problem:   ABLA (acute blood loss anemia) Superimposed on chronic   Anemia, iron  deficiency In the setting of:   Acute upper GI bleed Admit to stepdown/inpatient. Clear liquid diet. Keep NPO after midnight. Continue IV fluids. Continue pantoprazole  40 mg IVP twice daily. Monitor H&H. Transfuse further as needed. 2 more units reserved with the blood bank. GI consult greatly appreciated.  Active Problems:   Hypokalemia Supplementing. Follow potassium level.    B12 deficiency On IM cyanocobalamin  injections.    Asthma No signs of decompensation. Bronchodilators as needed.    Chronic constipation Continue Linzess  daily. Hold oxybutynin  and dicyclomine . Might benefit from fiber supplementation.    Peripheral neuropathy Continue gabapentin  600 mg p.o. 3 times daily. Analgesics as needed.    Depression with anxiety Continue bupropion  450 mg p.o. in the morning. Continue buspirone  50 mg p.o. 3 times daily. Continue clonazepam  1 mg p.o. twice daily as needed.    Hyperlipidemia Continue atorvastatin  40 mg p.o. daily.    Mild protein malnutrition (HCC) This is secondary to blood loss.    S/P left TKA Supportive care.    Advance Care Planning:   Code Status: Full Code   Consults: [facility] Charna Keas, DO)  Family Communication:   Severity of Illness: The appropriate patient status for this patient is INPATIENT. Inpatient status is judged to be reasonable and necessary in order to provide the required intensity of service to  ensure the patient's safety. The patient's presenting symptoms, physical exam findings, and initial radiographic and laboratory data in the context of their chronic comorbidities is felt to place them at high risk for further clinical deterioration. Furthermore, it is not anticipated that the patient will be medically stable for discharge from the hospital within 2 midnights of admission.   * I certify that at the point of admission it is my clinical judgment that the patient  will require inpatient hospital care spanning beyond 2 midnights from the point of admission due to high intensity of service, high risk for further deterioration and high frequency of surveillance required.*  Author: Alm Dorn Castor, MD 10/01/2023 8:02 AM  For on call review [email or web address].   This document was prepared using Dragon voice recognition software and may contain some unintended transcription errors.

## 2023-10-01 NOTE — ED Notes (Signed)
 Date and time results received: 10/01/23 12:04 AM  (use smartphrase .now to insert current time)  Test: Hemoglobin Critical Value: 5.6  Name of Provider Notified: [place]  Orders Received? Or Actions Taken?: Orders Received - See Orders for details

## 2023-10-01 NOTE — Progress Notes (Signed)
  Carryover admission to the Day Admitter.  I discussed this case with the EDP, Dr. Midge.  Per these discussions:   This is a 48 year old female who is being admitted with acute upper gastrointestinal bleed after presenting with 2-3 episodes of hematemesis, coffee-ground emesis over the last few days.  Notable history includes development of a marginal ulcer after gastric bypass, with subsequent placement of PEG tube by general surgery on 09/14/23.   Over the last 2 3 days, she has had initially an episode of hematemesis with some bright red blood, followed by 2 episodes of coffee-ground emesis, most recent episode on 09/30/2023.  Yesterday, she also had 2 episodes of syncope with prodrome while on the toilet.  She has had no additional episodes of hematemesis or syncope while in the ED.  Not on any blood thinners.  Heart rates were initially in the low 100s, simply decreasing the 80s with initiation of transfusion PRBC; stop blood pressures have been in the low 100s to 1 teens.  Her presenting hemoglobin is 5.6 compared to most recent prior value of 8.6 on 09/16/2023.  She underwent CT head, cervical spine, and abdomen/pelvis without any acute abnormality identified on the scans.  Started on IV Protonix  in the ED, along with initiation of transfusion of 2 units PRBC.  EDP has contacted on-call [facility], Dr. Rosalie, requesting formal consult this morning.   I have placed an order for inpatient admission to stepdown unit for further evaluation management of the above  I have placed some additional preliminary admit orders via the adult multi-morbid admission order set. I have also ordered continuation of IV Protonix , n.p.o.  I also ordered morning labs in the form of CMP, CBC, magnesium  level and have ordered acute 4-hour H&H checks through 1300 today.  Fall precautions ordered.    Eva Pore, DO Hospitalist

## 2023-10-02 ENCOUNTER — Inpatient Hospital Stay (HOSPITAL_COMMUNITY): Payer: Medicare Other | Admitting: Anesthesiology

## 2023-10-02 ENCOUNTER — Encounter (HOSPITAL_COMMUNITY): Payer: Self-pay | Admitting: Internal Medicine

## 2023-10-02 ENCOUNTER — Encounter (HOSPITAL_COMMUNITY)
Admission: EM | Disposition: A | Discharge status: Home Health | Payer: Self-pay | Source: Home / Self Care | Attending: Internal Medicine

## 2023-10-02 DIAGNOSIS — Z87891 Personal history of nicotine dependence: Secondary | ICD-10-CM | POA: Diagnosis not present

## 2023-10-02 DIAGNOSIS — D62 Acute posthemorrhagic anemia: Secondary | ICD-10-CM | POA: Diagnosis not present

## 2023-10-02 DIAGNOSIS — J45909 Unspecified asthma, uncomplicated: Secondary | ICD-10-CM | POA: Diagnosis not present

## 2023-10-02 DIAGNOSIS — K284 Chronic or unspecified gastrojejunal ulcer with hemorrhage: Secondary | ICD-10-CM | POA: Diagnosis not present

## 2023-10-02 DIAGNOSIS — F418 Other specified anxiety disorders: Secondary | ICD-10-CM

## 2023-10-02 DIAGNOSIS — E538 Deficiency of other specified B group vitamins: Secondary | ICD-10-CM | POA: Diagnosis not present

## 2023-10-02 DIAGNOSIS — K922 Gastrointestinal hemorrhage, unspecified: Secondary | ICD-10-CM | POA: Diagnosis not present

## 2023-10-02 HISTORY — PX: ESOPHAGOGASTRODUODENOSCOPY: SHX5428

## 2023-10-02 LAB — MAGNESIUM: Magnesium: 2.2 mg/dL (ref 1.7–2.4)

## 2023-10-02 LAB — BASIC METABOLIC PANEL
Anion gap: 5 (ref 5–15)
BUN: 8 mg/dL (ref 6–20)
CO2: 24 mmol/L (ref 22–32)
Calcium: 8.7 mg/dL — ABNORMAL LOW (ref 8.9–10.3)
Chloride: 109 mmol/L (ref 98–111)
Creatinine, Ser: <0.3 mg/dL — ABNORMAL LOW (ref 0.44–1.00)
Glucose, Bld: 87 mg/dL (ref 70–99)
Potassium: 4.1 mmol/L (ref 3.5–5.1)
Sodium: 138 mmol/L (ref 135–145)

## 2023-10-02 LAB — CBC
HCT: 28.4 % — ABNORMAL LOW (ref 36.0–46.0)
HCT: 30.5 % — ABNORMAL LOW (ref 36.0–46.0)
Hemoglobin: 9.5 g/dL — ABNORMAL LOW (ref 12.0–15.0)
Hemoglobin: 9.8 g/dL — ABNORMAL LOW (ref 12.0–15.0)
MCH: 30.6 pg (ref 26.0–34.0)
MCH: 29.7 pg (ref 26.0–34.0)
MCHC: 33.5 g/dL (ref 30.0–36.0)
MCHC: 32.1 g/dL (ref 30.0–36.0)
MCV: 91.6 fL (ref 80.0–100.0)
MCV: 92.4 fL (ref 80.0–100.0)
Platelets: 315 10*3/uL (ref 150–400)
Platelets: 375 10*3/uL (ref 150–400)
RBC: 3.1 MIL/uL — ABNORMAL LOW (ref 3.87–5.11)
RBC: 3.3 MIL/uL — ABNORMAL LOW (ref 3.87–5.11)
RDW: 14.8 % (ref 11.5–15.5)
RDW: 15.3 % (ref 11.5–15.5)
WBC: 9.5 10*3/uL (ref 4.0–10.5)
WBC: 8.7 10*3/uL (ref 4.0–10.5)
nRBC: 0.2 % (ref 0.0–0.2)
nRBC: 0 % (ref 0.0–0.2)

## 2023-10-02 LAB — PHOSPHORUS: Phosphorus: 3.6 mg/dL (ref 2.5–4.6)

## 2023-10-02 SURGERY — EGD (ESOPHAGOGASTRODUODENOSCOPY)
Anesthesia: Monitor Anesthesia Care

## 2023-10-02 MED ORDER — SODIUM CHLORIDE 0.9 % IV SOLN: INTRAVENOUS | Status: DC | PRN

## 2023-10-02 MED ORDER — SODIUM CHLORIDE 0.9% FLUSH
3.0000 mL | Freq: Two times a day (BID) | INTRAVENOUS | Status: DC
  Administered 2023-10-02 – 2023-10-06 (×8): 3 mL via INTRAVENOUS

## 2023-10-02 MED ORDER — SODIUM CHLORIDE 0.9% FLUSH
10.0000 mL | INTRAVENOUS | Status: DC | PRN
  Administered 2023-10-02 – 2023-10-03 (×2): 10 mL via INTRAVENOUS

## 2023-10-02 MED ORDER — PROPOFOL 10 MG/ML IV BOLUS
INTRAVENOUS | Status: DC | PRN
  Administered 2023-10-02 (×2): 30 mg via INTRAVENOUS

## 2023-10-02 MED ORDER — PROPOFOL 500 MG/50ML IV EMUL
INTRAVENOUS | Status: AC
  Filled 2023-10-02: qty 50

## 2023-10-02 MED ORDER — ENSURE MAX PROTEIN PO LIQD
11.0000 [oz_av] | Freq: Two times a day (BID) | ORAL | Status: DC
  Administered 2023-10-02 – 2023-10-06 (×8): 11 [oz_av] via ORAL
  Filled 2023-10-02 (×9): qty 330

## 2023-10-02 MED ORDER — PROPOFOL 500 MG/50ML IV EMUL
INTRAVENOUS | Status: DC | PRN
  Administered 2023-10-02: 125 ug/kg/min via INTRAVENOUS

## 2023-10-02 NOTE — Interval H&P Note (Signed)
 History and Physical Interval Note:  10/02/2023 7:32 AM  [name]  has presented today for surgery, with the diagnosis of Hematemesis, history marginal ulcer.  The various methods of treatment have been discussed with the patient and family. After consideration of risks, benefits and other options for treatment, the patient has consented to  Procedure(s): ESOPHAGOGASTRODUODENOSCOPY (EGD) (N/A) as a surgical intervention.  The patient's history has been reviewed, patient examined, no change in status, stable for surgery.  I have reviewed the patient's chart and labs.  Questions were answered to the patient's satisfaction.     BURNETTE ELSIE CHRISTELLA

## 2023-10-02 NOTE — Anesthesia Procedure Notes (Signed)
 Procedure Name: MAC Date/Time: 10/02/2023 7:46 AM  Performed by: Dasie Nena PARAS, CRNAPre-anesthesia Checklist: Patient identified, Emergency Drugs available, Suction available, Patient being monitored and Timeout performed Oxygen Delivery Method: Nasal cannula Placement Confirmation: positive ETCO2

## 2023-10-02 NOTE — Anesthesia Postprocedure Evaluation (Signed)
 Anesthesia Post Note  Patient: [name]  Procedure(s) Performed: ESOPHAGOGASTRODUODENOSCOPY (EGD)     Patient location during evaluation: PACU Anesthesia Type: MAC Level of consciousness: awake and alert Pain management: pain level controlled Vital Signs Assessment: post-procedure vital signs reviewed and stable Respiratory status: spontaneous breathing, nonlabored ventilation, respiratory function stable and patient connected to nasal cannula oxygen Cardiovascular status: stable and blood pressure returned to baseline Postop Assessment: no apparent nausea or vomiting Anesthetic complications: no  No notable events documented.  Last Vitals:  Vitals:   10/02/23 0815 10/02/23 0830  BP: 105/67 104/69  Pulse: 66 66  Resp: 13 18  Temp:  36.7 C  SpO2: 100% 100%    Last Pain:  Vitals:   10/02/23 0830  TempSrc:   PainSc: 0-No pain                 Alyannah Sanks,W. EDMOND

## 2023-10-02 NOTE — Anesthesia Preprocedure Evaluation (Addendum)
 Anesthesia Evaluation  Patient identified by MRN, date of birth, ID band Patient awake    Reviewed: Allergy & Precautions, H&P , NPO status , Patient's Chart, lab work & pertinent test results  History of Anesthesia Complications (+) PONV and history of anesthetic complications  Airway Mallampati: II  TM Distance: >3 FB Neck ROM: Full    Dental no notable dental hx. (+) Edentulous Upper, Edentulous Lower, Dental Advisory Given   Pulmonary asthma , former smoker   Pulmonary exam normal breath sounds clear to auscultation       Cardiovascular negative cardio ROS  Rhythm:Regular Rate:Normal     Neuro/Psych  Headaches  Anxiety Depression       GI/Hepatic Neg liver ROS, hiatal hernia, PUD,GERD  ,,  Endo/Other  negative endocrine ROS    Renal/GU negative Renal ROS  negative genitourinary   Musculoskeletal  (+) Arthritis ,    Abdominal   Peds  Hematology  (+) Blood dyscrasia, anemia   Anesthesia Other Findings   Reproductive/Obstetrics negative OB ROS                             Anesthesia Physical Anesthesia Plan  ASA: 3  Anesthesia Plan: MAC   Post-op Pain Management: Minimal or no pain anticipated   Induction: Intravenous  PONV Risk Score and Plan: 3 and Propofol  infusion and Treatment may vary due to age or medical condition  Airway Management Planned: Natural Airway and Simple Face Mask  Additional Equipment:   Intra-op Plan:   Post-operative Plan:   Informed Consent: I have reviewed the patients History and Physical, chart, labs and discussed the procedure including the risks, benefits and alternatives for the proposed anesthesia with the patient or authorized representative who has indicated his/her understanding and acceptance.     Dental advisory given  Plan Discussed with: CRNA  Anesthesia Plan Comments:        Anesthesia Quick Evaluation

## 2023-10-02 NOTE — Progress Notes (Addendum)
 [facility] PROGRESS NOTE   [name] FMW:[id number] DOB: Feb 06, 1976 DOA: 09/30/2023  PCP: Elizbeth Leita Ruth, FNP  Brief History: 48 y.o. 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  Postsurgical course was complicated by marginal ulcer, poor nutritional status requiring placement of gastrostomy tube.  Also has history of posterior anal fissure, anxiety, asthma, bronchitis, chronic headaches, history of C. difficile infection, dehydration, depression, GERD, hiatal hernia, history of UTIs, joint swelling/pain, peripheral neuropathy, postoperative nausea and vomiting, right carpal/tunnel syndrome, ulcerative colitis, tobacco use, vertigo, history of PUD who presented to the emergency department complaints of having a fall with after multiple episodes of emesis complicated by several episodes of hematemesis.  She was hospitalized for further management.   Consultants: Gastroenterology  Procedures:  Upper endoscopy 10/02/23 Findings:      The examined esophagus was normal.      Gastric pouch was inflamed but otherwise normal.      Two non-bleeding cratered ulcers with pigmented material were found at       the gastrojejunal anastomosis. The largest lesion was 15 mm in largest       dimension.      The examined efferent jejunal limb was normal.      No old or fresh blood seen to the extent of our examination.    Subjective/Interval History: Patient complains of upper abdominal pain.  She mentions that she is barely able to eat anything by mouth that she keeps throwing it up.  Most of her nutrition comes through tube feedings.  No further episodes of vomiting this morning.    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  Recently had a G-tube placement by general surgery. Seen by gastroenterology.   Underwent upper endoscopy today.  Gastric pouch was inflamed but otherwise normal.  2 nonbleeding cratered ulcers were noted at the gastrojejunal anastomosis. GI recommends full liquid diet for today.  PPI twice daily. In view of recent surgical involvement will also notify general surgery of her hospitalization. Will resume tube feedings from tomorrow if hemoglobin remains stable and has no further recurrence of bleeding.  Acute blood loss anemia She had a hemoglobin of 8.6 on 12/19.  Came in with a hemoglobin of 5.6.  Transfused 2 PRBCs with improvement in hemoglobin noted to 9.5 today.  Will recheck labs later today and tomorrow.    Hypokalemia Supplemented.  History of B12 deficiency On intramuscular B12 supplementation 2 times a week.  Chronic pain syndrome Home medication list shows that she is on buprenorphine  twice a day along with hydromorphone  on as needed.  This is being continued here in the hospital. Patient mentions that she is followed by pain management at [facility].  History of depression with anxiety Continue home medications.  Peripheral neuropathy Continue gabapentin .  Hyperlipidemia Continue statin.  History of migraines Noted to be Atogepant  which is used for migraine prophylaxis.   DVT Prophylaxis: SCDs Code Status: Full code Family Communication: Discussed with patient Disposition Plan: Hopefully return home when improved  Status is: Inpatient Remains inpatient appropriate because: Acute blood loss anemia, concern for GI bleeding      Medications: Scheduled:  Atogepant   30 mg Oral Daily   atorvastatin   40 mg Oral q1800   Buprenorphine  HCl  75 mcg Sublingual BID   buPROPion   450 mg Oral q morning  busPIRone   15 mg Oral TID   Chlorhexidine  Gluconate Cloth  6 each Topical Daily   dicyclomine   10 mg Oral TID AC & HS   DULoxetine   60 mg Oral BID   famotidine   20 mg Oral BID   gabapentin   600 mg Oral TID   HYDROmorphone   4 mg Oral  Q3H   linaclotide   290 mcg Oral QAC breakfast   magnesium  oxide  400 mg Oral Daily   mesalamine   2.4 g Oral Q breakfast   midodrine   15 mg Oral TID WC   pantoprazole  (PROTONIX ) IV  40 mg Intravenous Q12H   sucralfate   1 g Oral QID   thiamine   100 mg Oral Q1200   Continuous: PRN:clonazePAM , fentaNYL  (SUBLIMAZE ) injection, methocarbamol , naLOXone  (NARCAN )  injection, ondansetron  (ZOFRAN ) IV, mouth rinse, SUMAtriptan   Antibiotics: Anti-infectives (From admission, onward)    None       Objective:  Vital Signs  Vitals:   10/02/23 0710 10/02/23 0804 10/02/23 0815 10/02/23 0830  BP: (!) 144/79 103/64 105/67 104/69  Pulse: 64 66 66 66  Resp: 12 20 13 18   Temp: 97.9 F (36.6 C) (!) 97.4 F (36.3 C)  98 F (36.7 C)  TempSrc: Temporal     SpO2: 100% 100% 100% 100%  Weight: 61.2 kg     Height: 5' 8.4 (1.737 m)       Intake/Output Summary (Last 24 hours) at 10/02/2023 1008 Last data filed at 10/02/2023 0805 Gross per 24 hour  Intake 2298.15 ml  Output 0 ml  Net 2298.15 ml   Filed Weights   10/01/23 1906 10/02/23 0306 10/02/23 0710  Weight: 60.4 kg 61.2 kg 61.2 kg    General appearance: Awake alert.  In no distress Resp: Clear to auscultation bilaterally.  Normal effort Cardio: S1-S2 is normal regular.  No S3-S4.  No rubs murmurs or bruit GI: Abdomen is soft.  G-tube is noted.  The abdomen is diffusely tender without any rebound rigidity or guarding. Extremities: No edema.  Full range of motion of lower extremities. Neurologic: Alert and oriented x3.  No focal neurological deficits.    Lab Results:  Data Reviewed: I have personally reviewed following labs and reports of the imaging studies  CBC: Recent Labs  Lab 09/30/23 2322 10/01/23 0450 10/01/23 1321 10/02/23 0305  WBC 14.2* 9.1  --  9.5  NEUTROABS  --  5.4  --   --   HGB 5.6* 5.4* 6.3* 9.5*  HCT 18.1* 16.9* 19.2* 28.4*  MCV 95.8 96.0  --  91.6  PLT 442* 306  --  315    Basic Metabolic Panel: Recent  Labs  Lab 09/30/23 2322 10/01/23 0450 10/02/23 0305  NA 135 138 138  K 3.6 3.4* 4.1  CL 105 109 109  CO2 23 25 24   GLUCOSE 125* 101* 87  BUN 23* 21* 8  CREATININE 0.73 0.43* <0.30*  CALCIUM  9.2 8.3* 8.7*  MG  --  2.2 2.2  PHOS  --   --  3.6    GFR: CrCl cannot be calculated (This lab value cannot be used to calculate CrCl because it is not a number: <0.30).  Liver Function Tests: Recent Labs  Lab 09/30/23 2322 10/01/23 0450  AST 20 17  ALT 16 14  ALKPHOS 48 38  BILITOT 0.3 0.3  PROT 7.1 5.6*  ALBUMIN  3.7 3.0*    Recent Labs  Lab 09/30/23 2322  LIPASE 24     Recent Results (from the past 240 hours)  MRSA Next Gen by PCR, Nasal     Status: None   Collection Time: 10/01/23  7:42 PM   Specimen: Nasal Mucosa; Nasal Swab  Result Value Ref Range Status   MRSA by PCR Next Gen NOT DETECTED NOT DETECTED Final    Comment: (NOTE) The GeneXpert MRSA Assay (FDA approved for NASAL specimens only), is one component of a comprehensive MRSA colonization surveillance program. It is not intended to diagnose MRSA infection nor to guide or monitor treatment for MRSA infections. Test performance is not FDA approved in patients less than 58 years old. Performed at [facility], [address] [place], [place] [address]       Radiology Studies: CT ABDOMEN PELVIS W CONTRAST Result Date: 10/01/2023 CLINICAL DATA:  Abdominal pain, acute, nonlocalized hematemesis EXAM: CT ABDOMEN AND PELVIS WITH CONTRAST TECHNIQUE: Multidetector CT imaging of the abdomen and pelvis was performed using the standard protocol following bolus administration of intravenous contrast. RADIATION DOSE REDUCTION: This exam was performed according to the departmental dose-optimization program which includes automated exposure control, adjustment of the mA and/or kV according to patient size and/or use of iterative reconstruction technique. CONTRAST:  OMNIPAQUE  IOHEXOL  300 MG/ML  SOLN  COMPARISON:  CT abdomen pelvis 01/17/2023, CT abdomen pelvis 01/30/2017, CT abdomen pelvis 11/19/2017, CT abdomen pelvis 12/11/2020 FINDINGS: Lower chest: Tiny hiatal hernia.  No acute abnormality. Hepatobiliary: No focal liver abnormality. No gallstones, gallbladder wall thickening, or pericholecystic fluid. No biliary dilatation. Pancreas: No focal lesion. Normal pancreatic contour. No surrounding inflammatory changes. No main pancreatic ductal dilatation. Spleen: Normal in size. There is a lobulated 2.1 cm hypodense lesion within the spleen with a density of 37 Hounsfield unit. Finding has been increasing in size since 11/19/2017. New from 01/30/2017. Adrenals/Urinary Tract: No adrenal nodule bilaterally. Bilateral kidneys enhance symmetrically. No hydronephrosis. No hydroureter. The urinary bladder is unremarkable. Stomach/Bowel: Roux-en-Y gastric bypass. Gastrostomy tube with tip and inflated balloon terminating within the bypassed gastric lumen. Outpouching along the bypassed gastric lumen with associated bowel wall thickening and adjacent non enhancement of the gastric wall with associated perigastric fat stranding (8:42). Stomach is within normal limits. No evidence of bowel wall thickening or dilatation. Appendix appears normal. Vascular/Lymphatic: No abdominal aorta or iliac aneurysm. No abdominal, pelvic, or inguinal lymphadenopathy. Reproductive: Status post hysterectomy. No adnexal masses. Other: No intraperitoneal free fluid. No intraperitoneal free gas. No organized fluid collection. Musculoskeletal: No abdominal wall hernia or abnormality. No suspicious lytic or blastic osseous lesions. No acute displaced fracture. IMPRESSION: 1. 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 No definite perforation. 2. Gastrostomy tube in appropriate position within the bypassed gastric lumen. 3. Tiny hiatal hernia. 4. Indeterminate slow growing splenic lesion now measuring 2.1 cm. Electronically Signed    By: Morgane  Naveau M.D.   On: 10/01/2023 03:23   CT Head Wo Contrast Result Date: 10/01/2023 CLINICAL DATA:  Head trauma, moderate-severe; Neck trauma, midline tenderness (Age 4-64y) EXAM: CT HEAD WITHOUT CONTRAST CT CERVICAL SPINE WITHOUT CONTRAST TECHNIQUE: Multidetector CT imaging of the head and cervical spine was performed following the standard protocol without intravenous contrast. Multiplanar CT image reconstructions of the cervical spine were also generated. RADIATION DOSE REDUCTION: This exam was performed according to the departmental dose-optimization program which includes automated exposure control, adjustment of the mA and/or kV according to patient size and/or use of iterative reconstruction technique. COMPARISON:  CT head and C-spine 02/28/2023 FINDINGS: CT HEAD FINDINGS Brain: No evidence of large-territorial acute infarction. No parenchymal hemorrhage. No mass  lesion. No extra-axial collection. Similar nonspecific calcification of the cerebellum. No mass effect or midline shift. No hydrocephalus. Basilar cisterns are patent. Vascular: No hyperdense vessel. Skull: No acute fracture or focal lesion. Sinuses/Orbits: Paranasal sinuses and mastoid air cells are clear. The orbits are unremarkable. Other: None. CT CERVICAL SPINE FINDINGS Alignment: Normal. Skull base and vertebrae: No acute fracture. No aggressive appearing focal osseous lesion or focal pathologic process. Soft tissues and spinal canal: No prevertebral fluid or swelling. No visible canal hematoma. Upper chest: Unremarkable. Other: None. IMPRESSION: 1. No acute intracranial abnormality. 2. No acute displaced fracture or traumatic listhesis of the cervical spine. Electronically Signed   By: Morgane  Naveau M.D.   On: 10/01/2023 02:51   CT Cervical Spine Wo Contrast Result Date: 10/01/2023 CLINICAL DATA:  Head trauma, moderate-severe; Neck trauma, midline tenderness (Age 56-64y) EXAM: CT HEAD WITHOUT CONTRAST CT CERVICAL SPINE WITHOUT  CONTRAST TECHNIQUE: Multidetector CT imaging of the head and cervical spine was performed following the standard protocol without intravenous contrast. Multiplanar CT image reconstructions of the cervical spine were also generated. RADIATION DOSE REDUCTION: This exam was performed according to the departmental dose-optimization program which includes automated exposure control, adjustment of the mA and/or kV according to patient size and/or use of iterative reconstruction technique. COMPARISON:  CT head and C-spine 02/28/2023 FINDINGS: CT HEAD FINDINGS Brain: No evidence of large-territorial acute infarction. No parenchymal hemorrhage. No mass lesion. No extra-axial collection. Similar nonspecific calcification of the cerebellum. No mass effect or midline shift. No hydrocephalus. Basilar cisterns are patent. Vascular: No hyperdense vessel. Skull: No acute fracture or focal lesion. Sinuses/Orbits: Paranasal sinuses and mastoid air cells are clear. The orbits are unremarkable. Other: None. CT CERVICAL SPINE FINDINGS Alignment: Normal. Skull base and vertebrae: No acute fracture. No aggressive appearing focal osseous lesion or focal pathologic process. Soft tissues and spinal canal: No prevertebral fluid or swelling. No visible canal hematoma. Upper chest: Unremarkable. Other: None. IMPRESSION: 1. No acute intracranial abnormality. 2. No acute displaced fracture or traumatic listhesis of the cervical spine. Electronically Signed   By: Morgane  Naveau M.D.   On: 10/01/2023 02:51       LOS: 1 day   Kaysan Peixoto [employer] on [email or web address]  10/02/2023, 10:08 AM

## 2023-10-02 NOTE — Plan of Care (Signed)
  Problem: Education: Goal: Knowledge of General Education information will improve Description: Including pain rating scale, medication(s)/side effects and non-pharmacologic comfort measures Outcome: Progressing   Problem: Clinical Measurements: Goal: Diagnostic test results will improve Outcome: Progressing Goal: Respiratory complications will improve Outcome: Progressing   Problem: Nutrition: Goal: Adequate nutrition will be maintained Outcome: Progressing   Problem: Elimination: Goal: Will not experience complications related to urinary retention Outcome: Progressing

## 2023-10-02 NOTE — Progress Notes (Addendum)
 Asked by Dr Vernetta about the pt.   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 Mikell Laparoscopic jejunostomy tube placement 05/11/2016 - Dr. Mikell for postoperative nausea and vomiting status post sleeve gastrectomy Laparoscopic conversion of sleeve gastrectomy to roux-en-y gastric bypass with takedown of feeding jejunostomy 12/14/2016 - Dr. Mikell for nausea and vomiting after sleeve gastrectomy EGD on 03/02/2017 - Dr. Ethyl noted a two superficial marginal ulcers at the gastrojejunostomy EGD on 05/20/2017 - Dr. Ethyl noted persistent superficial marginal ulcers EGD on 10/07/2017 - Dr. Ethyl noted persistent marginal ulcer Revision and resection of her gastrojejunostomy with gastrostomy tube placement 10/25/2017 - Dr. Mikell Approximately 1/2 of her pouch was resected, and a centimeter or two of her roux limb was resected. The GJ was performed in the typical fashion with a posterior layer of vicry, a 45mm blue load to create the anastomosis, 2-0 vicryl closure of the enterotomy and 2-0 vicryl anterior outer layer. During the first attempt at firing the stapler, the anvil was located behind the roux limb and not in the lumen, so everything was taken down and redone. A g tube was placed in the remnant stomach (previously distal sleeve) medial to the roux limb. EGD on 01/20/23 - Dr. Saintclair noted non bleeding gastric and jejunal ulcers EGD on 07/25/23 - Dr. Kriss noted oozing marginal ulcers treated with bipolar cautery  Lap placement of G tube in gastric remnant 09/14/23 Dr Stechschulte  Pt discharge on supplemental G tube feeds  Pt has already had 1 revision of G-J and partial resection of her gastric pouch for persistent ulcers by Dr Mikell 2019. Now with persistent ulcers  Unfortunately not a 'quick' fix to address this.   Rec Nutrition consult for enteral feeds  Pt will need supplemental G tube enteral  nutrition for future surgery and to maintain nutritional status Bariatric MVI Bari FLD and bari shakes by mouth as tolerated (ensure max) Check prealbumin and bari labs (anemia panel, vit b1, vit a) Check h pylori stool antigen Continue IV bid PPI and po carafate   Reportedly the pt is nonsmoking, no NSAIDs, non drinker (risk factors for ulcers) Will likely require discussion and long term management with our partners in [place] (options include truncal vagotomy, with +/-revision of G-J; or reversal gastric bypass--  would need to review imaging with radiology to get sense of how much gastric pouch remains)    Dr Lyndel will be available on monday  Katherine HERO. Tanda, MD, FACS General, Bariatric, & Minimally Invasive Surgery [facility],  [facility]

## 2023-10-02 NOTE — Op Note (Signed)
 [facility] Patient Name: [name] Procedure Date: 10/02/2023 MRN: [id number] Attending MD: Elsie Cree , MD, [id number] Date of Birth: 1976-05-22 CSN: [id number] Age: 48 Admit Type: Inpatient Procedure:                Upper GI endoscopy Indications:              Acute post hemorrhagic anemia, Hematemesis Providers:                Elsie Cree, MD, Asberry Marchi, RN, Coye                            [place], Technician Referring MD:             [facility] Medicines:                Monitored Anesthesia Care Complications:            No immediate complications. Estimated Blood Loss:     Estimated blood loss: none. Procedure:                Pre-Anesthesia Assessment:                           - Prior to the procedure, a History and Physical                            was performed, and patient medications and                            allergies were reviewed. The patient's tolerance of                            previous anesthesia was also reviewed. The risks                            and benefits of the procedure and the sedation                            options and risks were discussed with the patient.                            All questions were answered, and informed consent                            was obtained. Prior Anticoagulants: The patient has                            taken no anticoagulant or antiplatelet agents. ASA                            Grade Assessment: III - A patient with severe                            systemic disease. After reviewing the risks and  benefits, the patient was deemed in satisfactory                            condition to undergo the procedure.                           After obtaining informed consent, the endoscope was                            passed under direct vision. Throughout the                            procedure, the patient's blood pressure, pulse, and                             oxygen saturations were monitored continuously. The                            [id number]-[id number] ([id number]) Olympus endoscope was introduced                            through the mouth, and advanced to the afferent and                            efferent jejunal loops. The upper GI endoscopy was                            accomplished without difficulty. The patient                            tolerated the procedure well. Scope In: Scope Out: Findings:      The examined esophagus was normal.      Gastric pouch was inflamed but otherwise normal.      Two non-bleeding cratered ulcers with pigmented material were found at       the gastrojejunal anastomosis. The largest lesion was 15 mm in largest       dimension.      The examined efferent jejunal limb was normal.      No old or fresh blood seen to the extent of our examination. Impression:               - Normal esophagus.                           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                           - Normal examined jejunum.                           -  No specimens collected. Moderate Sedation:      None Recommendation:           - Return patient to hospital ward for ongoing care.                           - Full liquid diet today.                           - Continue present medications.                           - PPI BID indefinitely.                           - Recheck stool H. pylori (last check 2019).                           - In absence of any reversible causes, if patient                            has ongoing recurrence of anastomotic ulcers with                            significant bleeding and anemia, this likely can                            only be remedied by revision of her Roux-en-Y or                            restoration of her native anatomy (if even                             possible).                           - No aspirin , ibuprofen , naproxen , or other                            non-steroidal anti-inflammatory drugs indefinitely.                           -[facility] will follow. Procedure Code(s):        --- Professional ---                           [phone], Esophagogastroduodenoscopy, flexible,                            transoral; diagnostic, including collection of                            specimen(s) by brushing or washing, when performed                            (separate procedure) Diagnosis Code(s):        --- Professional ---  K28.9, Gastrojejunal ulcer, unspecified as acute or                            chronic, without hemorrhage or perforation                           D62, Acute posthemorrhagic anemia                           K92.0, Hematemesis CPT copyright 2022 [employer]. All rights reserved. The codes documented in this report are preliminary and upon coder review may  be revised to meet current compliance requirements. Elsie Cree, MD 10/02/2023 8:13:23 AM This report has been signed electronically. Number of Addenda: 0

## 2023-10-02 NOTE — Transfer of Care (Signed)
 Immediate Anesthesia Transfer of Care Note  Patient: [name]  Procedure(s) Performed: ESOPHAGOGASTRODUODENOSCOPY (EGD)  Patient Location: PACU  Anesthesia Type:MAC  Level of Consciousness: awake, drowsy, and responds to stimulation  Airway & Oxygen Therapy: Patient Spontanous Breathing and Patient connected to nasal cannula oxygen  Post-op Assessment: Report given to RN and Post -op Vital signs reviewed and stable  Post vital signs: Reviewed and stable  Last Vitals:  Vitals Value Taken Time  BP 103/64 10/02/23 0804  Temp    Pulse 65 10/02/23 0805  Resp    SpO2 100 % 10/02/23 0805  Vitals shown include unfiled device data.  Last Pain:  Vitals:   10/02/23 0710  TempSrc: Temporal  PainSc: 0-No pain         Complications: No notable events documented.

## 2023-10-02 NOTE — Progress Notes (Addendum)
 Initial Nutrition Assessment  DOCUMENTATION CODES:   Not applicable  INTERVENTION:   Once ok to resume tube feeds:  Osmolite 1.5@60ml /hr- Initiate at 58ml/hr and increase by 10ml/hr q 8 hours until goal rate is reached.   Free water  flushes q3 hours to maintain tube patency   Regimen provides 2160kcal/day, 90g/day protein and 1863ml/day of free water .   Pt at high refeed risk; recommend monitor potassium, magnesium  and phosphorus labs daily until stable  MVI po daily   Daily weights   Check vitamins A, D, E, K, C, B1, B6, B12, folate, iron , TIBC, ferritin, zinc  and copper   NUTRITION DIAGNOSIS:   Inadequate oral intake related to altered GI function as evidenced by other (comment) (pt with G-tube on nutrition support).  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 complicated by post op intractable nausea/vomiting s/p surgical J-tube placement 05/11/2016, s/p 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  RD working remotely.  Pt s/p EGD today; found to have two ulcers at the G-J anastomosis.   Spoke with pt via phone. Pt reports that she is not feeling well today; pt reports weakness and lightheadedness. Pt reports that she has been tolerating her tube feeds at home. Pt has been using Osmolite 1.5@50ml /hr continuous over 24 hrs. Pt reports that she has been eating some soft foods that she knows will stay down such as grits, mashed potatoes and soups. Pt reports that she has continued to take her bariatric multivitamins. Per chart, pt has maintained her weight since October. Spoke with pt regarding the possibility of additional surgeries that may be needed to in the future to reverse her gastric bypass. Discussed that our goal with tube feeds is to maximize pt's nutritional status as much as possible to support post op healing and recovery. Will plan on increasing pt's tube feed rate to provide additional calories and protein. Will check bariatric vitamins labs to r/o deficiency; this was discussed with pt. Spoke with MD and RN, plan is to hold tube feeds for tonight secondary to GIB. Will resume tube feeds once ok per MD. RN with concerns that patient's G-tube is not flush with pt's skin; RN to follow up with surgery regarding tube positioning.    Pt is at high risk for malnutrition. Will obtain nutrition related exam at follow up  NUTRITION - FOCUSED PHYSICAL EXAM: Unable to perform at this time   Diet Order:   Diet Order             Diet bariatric full liquid Room service appropriate? Yes; Fluid consistency: Thin  Diet effective now  EDUCATION NEEDS:   Not appropriate for education at this time  Skin:  Skin Assessment: Reviewed RN Assessment  Last BM:  PTA  Height:   Ht Readings from Last 1 Encounters:  10/02/23 5' 8.4 (1.737 m)    Weight:   Wt Readings from Last 1 Encounters:  10/02/23 61.2 kg     Ideal Body Weight:  63.6 kg  BMI:  Body mass index is 20.28 kg/m.  Estimated Nutritional Needs:   Kcal:  1900-2200kcal/day  Protein:  95-110g/day  Fluid:  1.9-2.2L/day  Augustin Shams MS, RD, LDN If unable to be reached, please send secure chat to RD inpatient available from 8:00a-4:00p daily

## 2023-10-02 NOTE — Progress Notes (Signed)
 Patient ID: [name], female   DOB: Jan 12, 1976, 48 y.o.   MRN: [id number]   I discussed the patient with one of our bariatric surgeons. She will likely eventually require a likely redo or reconnection procedure. Right now she needs a dietician/nutrition consult to resume tube feeds, check nutrition status.  Will notify Dr. Lyndel of the patient's admission so he can weigh in Monday

## 2023-10-03 DIAGNOSIS — D62 Acute posthemorrhagic anemia: Secondary | ICD-10-CM | POA: Diagnosis not present

## 2023-10-03 DIAGNOSIS — K922 Gastrointestinal hemorrhage, unspecified: Secondary | ICD-10-CM | POA: Diagnosis not present

## 2023-10-03 DIAGNOSIS — E538 Deficiency of other specified B group vitamins: Secondary | ICD-10-CM | POA: Diagnosis not present

## 2023-10-03 DIAGNOSIS — F418 Other specified anxiety disorders: Secondary | ICD-10-CM | POA: Diagnosis not present

## 2023-10-03 LAB — GLUCOSE, CAPILLARY
Glucose-Capillary: 111 mg/dL — ABNORMAL HIGH (ref 70–99)
Glucose-Capillary: 78 mg/dL (ref 70–99)
Glucose-Capillary: 73 mg/dL (ref 70–99)

## 2023-10-03 LAB — BASIC METABOLIC PANEL
Anion gap: 5 (ref 5–15)
BUN: 13 mg/dL (ref 6–20)
CO2: 27 mmol/L (ref 22–32)
Calcium: 8.9 mg/dL (ref 8.9–10.3)
Chloride: 106 mmol/L (ref 98–111)
Creatinine, Ser: 0.56 mg/dL (ref 0.44–1.00)
GFR, Estimated: >60 mL/min (ref >60)
Glucose, Bld: 81 mg/dL (ref 70–99)
Potassium: 4.5 mmol/L (ref 3.5–5.1)
Sodium: 138 mmol/L (ref 135–145)

## 2023-10-03 LAB — CBC
HCT: 28.4 % — ABNORMAL LOW (ref 36.0–46.0)
Hemoglobin: 9.2 g/dL — ABNORMAL LOW (ref 12.0–15.0)
MCH: 30.4 pg (ref 26.0–34.0)
MCHC: 32.4 g/dL (ref 30.0–36.0)
MCV: 93.7 fL (ref 80.0–100.0)
Platelets: 368 10*3/uL (ref 150–400)
RBC: 3.03 MIL/uL — ABNORMAL LOW (ref 3.87–5.11)
RDW: 14.9 % (ref 11.5–15.5)
WBC: 7.1 10*3/uL (ref 4.0–10.5)
nRBC: 0 % (ref 0.0–0.2)

## 2023-10-03 LAB — RETICULOCYTES
Immature Retic Fract: 27.8 % — ABNORMAL HIGH (ref 2.3–15.9)
RBC.: 3.02 MIL/uL — ABNORMAL LOW (ref 3.87–5.11)
Retic Count, Absolute: 120.5 10*3/uL (ref 19.0–186.0)
Retic Ct Pct: 4 % — ABNORMAL HIGH (ref 0.4–3.1)

## 2023-10-03 LAB — VITAMIN B12: Vitamin B-12: 774 pg/mL (ref 180–914)

## 2023-10-03 LAB — PREALBUMIN: Prealbumin: 17 mg/dL — ABNORMAL LOW (ref 18–38)

## 2023-10-03 LAB — VITAMIN D 25 HYDROXY (VIT D DEFICIENCY, FRACTURES): Vit D, 25-Hydroxy: 86.36 ng/mL (ref 30–100)

## 2023-10-03 LAB — FOLATE: Folate: 9 ng/mL (ref >5.9)

## 2023-10-03 LAB — IRON AND TIBC
Iron: 24 ug/dL — ABNORMAL LOW (ref 28–170)
Saturation Ratios: 8 % — ABNORMAL LOW (ref 10.4–31.8)
TIBC: 302 ug/dL (ref 250–450)
UIBC: 278 ug/dL

## 2023-10-03 LAB — PHOSPHORUS: Phosphorus: 5.3 mg/dL — ABNORMAL HIGH (ref 2.5–4.6)

## 2023-10-03 LAB — FERRITIN: Ferritin: 8 ng/mL — ABNORMAL LOW (ref 11–307)

## 2023-10-03 MED ORDER — FREE WATER
100.0000 mL | Status: DC
  Administered 2023-10-03 – 2023-10-06 (×20): 100 mL

## 2023-10-03 MED ORDER — ORAL CARE MOUTH RINSE
15.0000 mL | OROMUCOSAL | Status: DC | PRN
Start: 2023-10-03 — End: 2023-10-06

## 2023-10-03 MED ORDER — OXYBUTYNIN CHLORIDE 5 MG PO TABS
10.0000 mg | ORAL_TABLET | Freq: Every day | ORAL | Status: DC
  Administered 2023-10-03 – 2023-10-05 (×3): 10 mg via ORAL
  Filled 2023-10-03 (×3): qty 2

## 2023-10-03 MED ORDER — MECLIZINE HCL 25 MG PO TABS
25.0000 mg | ORAL_TABLET | Freq: Three times a day (TID) | ORAL | Status: DC
  Administered 2023-10-03 – 2023-10-06 (×11): 25 mg via ORAL
  Filled 2023-10-03 (×11): qty 1

## 2023-10-03 MED ORDER — ORAL CARE MOUTH RINSE
15.0000 mL | OROMUCOSAL | Status: DC
  Administered 2023-10-03 – 2023-10-06 (×12): 15 mL via OROMUCOSAL

## 2023-10-03 MED ORDER — OSMOLITE 1.2 CAL PO LIQD
1000.0000 mL | ORAL | Status: DC
  Administered 2023-10-03: 1000 mL
  Filled 2023-10-03: qty 1000

## 2023-10-03 MED ORDER — NALDEMEDINE TOSYLATE 0.2 MG PO TABS
0.2000 mg | ORAL_TABLET | Freq: Every day | ORAL | Status: DC
  Administered 2023-10-05 – 2023-10-06 (×2): 0.2 mg via ORAL
  Filled 2023-10-03 (×3): qty 1

## 2023-10-03 NOTE — Progress Notes (Signed)
 Subjective: No further bleeding.  Objective: Vital signs in last 24 hours: Temp:  [97.7 F (36.5 C)-98.6 F (37 C)] 97.7 F (36.5 C) (01/05 0305) Pulse Rate:  [57-81] 75 (01/05 1202) Resp:  [4-23] 10 (01/05 1202) BP: (95-141)/(55-99) 101/64 (01/05 1201) SpO2:  [90 %-100 %] 99 % (01/05 1202) Weight:  [61.2 kg] 61.2 kg (01/05 0500) Weight change: 0.8 kg Last BM Date : 09/29/23  PE: GEN:  NAD NEURO:  A/O, no encephalopathy  Lab Results: CBC    Component Value Date/Time   WBC 7.1 10/03/2023 0329   RBC 3.03 (L) 10/03/2023 0329   RBC 3.02 (L) 10/03/2023 0329   HGB 9.2 (L) 10/03/2023 0329   HGB 11.7 04/22/2018 1038   HCT 28.4 (L) 10/03/2023 0329   HCT 35.6 07/20/2018 1109   PLT 368 10/03/2023 0329   PLT 299 04/22/2018 1038   MCV 93.7 10/03/2023 0329   MCV 89.1 11/21/2019 1518   MCV 83 04/22/2018 1038   MCH 30.4 10/03/2023 0329   MCHC 32.4 10/03/2023 0329   RDW 14.9 10/03/2023 0329   RDW 20.1 (H) 04/22/2018 1038   LYMPHSABS 2.9 10/01/2023 0450   LYMPHSABS 3.6 (H) 04/22/2018 1038   MONOABS 0.6 10/01/2023 0450   EOSABS 0.2 10/01/2023 0450   EOSABS 0.2 04/22/2018 1038   BASOSABS 0.0 10/01/2023 0450   BASOSABS 0.0 04/22/2018 1038  CMP     Component Value Date/Time   NA 138 10/03/2023 0329   NA 137 06/11/2022 1620   K 4.5 10/03/2023 0329   CL 106 10/03/2023 0329   CO2 27 10/03/2023 0329   GLUCOSE 81 10/03/2023 0329   BUN 13 10/03/2023 0329   BUN 20 06/11/2022 1620   CREATININE 0.56 10/03/2023 0329   CREATININE 0.88 06/05/2016 1003   CALCIUM  8.9 10/03/2023 0329   PROT 5.6 (L) 10/01/2023 0450   PROT 7.4 06/11/2022 1620   ALBUMIN  3.0 (L) 10/01/2023 0450   ALBUMIN  4.7 06/11/2022 1620   AST 17 10/01/2023 0450   ALT 14 10/01/2023 0450   ALKPHOS 38 10/01/2023 0450   BILITOT 0.3 10/01/2023 0450   BILITOT 0.4 06/11/2022 1620   GFR 103.08 01/30/2019 1036   EGFR 64 06/11/2022 1620   GFRNONAA >60 10/03/2023 0329   GFRNONAA 84 06/05/2016 1003    Assessment:    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  Plan: \ 1.  Pantoprazole  40 mg po bid and sucralfate  suspension 1 gram po qac/at bedtime now and upon hospital discharge. 2.  No NSAIDs. 3.  Checking H. Pylori stool antigen. 4.  Advancing diet. 5.  [facility] will sign-off; surgeons to decide optimal long-term strategy for this patient, as I suspect without some type of surgical intervention patient is going to continue to have similar problems; please call with questions; thank you for the consultation.   BURNETTE ELSIE HERO 10/03/2023, 12:10 PM   Cell [phone] If no answer or after 5 PM call [phone]

## 2023-10-03 NOTE — Progress Notes (Addendum)
 [facility] PROGRESS NOTE   MILESSA Lutz FMW:[id number] DOB: Nov 13, 1975 DOA: 09/30/2023  PCP: Elizbeth Leita Ruth, FNP  Brief History: 48 y.o. 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  Postsurgical course was complicated by marginal ulcer, poor nutritional status requiring placement of gastrostomy tube.  Also has history of posterior anal fissure, anxiety, asthma, bronchitis, chronic headaches, history of C. difficile infection, dehydration, depression, GERD, hiatal hernia, history of UTIs, joint swelling/pain, peripheral neuropathy, postoperative nausea and vomiting, right carpal/tunnel syndrome, ulcerative colitis, tobacco use, vertigo, history of PUD who presented to the emergency department complaints of having a fall with after multiple episodes of emesis complicated by several episodes of hematemesis.  She was hospitalized for further management.   Consultants: Gastroenterology  Procedures:  Upper endoscopy 10/02/23 Findings:      The examined esophagus was normal.      Gastric pouch was inflamed but otherwise normal.      Two non-bleeding cratered ulcers with pigmented material were found at       the gastrojejunal anastomosis. The largest lesion was 15 mm in largest       dimension.      The examined efferent jejunal limb was normal.      No old or fresh blood seen to the extent of our examination.    Subjective/Interval History: Patient mentions that she is feeling a little better this morning.  Got some rest overnight.  No nausea or vomiting in the last 24 hours.  Want something to eat and drink.    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  Recently had a G-tube placement by general surgery. Seen by gastroenterology.  Underwent upper endoscopy.  Gastric pouch was inflamed but otherwise  normal.  2 nonbleeding cratered ulcers were noted at the gastrojejunal anastomosis. Hemoglobin noted to be stable.  No further recurrence of coffee-ground emesis. Advance diet.  Resume tube feedings. General surgery was consulted as well.  They plan to formally evaluate her tomorrow.  Acute blood loss anemia/history of iron  deficiency She had a hemoglobin of 8.6 on 12/19.  Came in with a hemoglobin of 5.6.  Transfused 2 PRBCs with improvement in hemoglobin noted to 9.5. Noted to be stable on 9.   Hypokalemia Supplemented.  History of B12 deficiency On intramuscular B12 supplementation 2 times a week.  Chronic pain syndrome Home medication list shows that she is on buprenorphine  twice a day along with hydromorphone  on as needed.  This is being continued here in the hospital. Patient mentions that she is followed by pain management at [facility].  History of depression with anxiety Continue home medications.  Peripheral neuropathy Continue gabapentin .  Hyperlipidemia Continue statin.  History of migraines Noted to be Atogepant  which is used for migraine prophylaxis.  History of constipation Continue with home medication regimen.   DVT Prophylaxis: SCDs Code Status: Full code Family Communication: Discussed with patient Disposition Plan: Hopefully return home when improved.  Start mobilizing.  Status is: Inpatient Remains inpatient appropriate because: Acute blood loss anemia, concern for GI bleeding      Medications: Scheduled:  Atogepant   30 mg Oral Daily   atorvastatin   40 mg Oral q1800   Buprenorphine  HCl  75 mcg Sublingual BID   buPROPion   450 mg Oral q morning   busPIRone   15 mg Oral TID   Chlorhexidine   Gluconate Cloth  6 each Topical Daily   dicyclomine   10 mg Oral TID AC & HS   DULoxetine   60 mg Oral BID   famotidine   20 mg Oral BID   free water   100 mL Per Tube Q4H   gabapentin   600 mg Oral TID   HYDROmorphone   4 mg Oral Q3H   linaclotide    290 mcg Oral QAC breakfast   magnesium  oxide  400 mg Oral Daily   meclizine   25 mg Oral TID   mesalamine   2.4 g Oral Q breakfast   midodrine   15 mg Oral TID WC   Naldemedine Tosylate   0.2 mg Oral Daily   oxybutynin   10 mg Oral QHS   pantoprazole  (PROTONIX ) IV  40 mg Intravenous Q12H   Ensure Max Protein  11 oz Oral BID   sodium chloride  flush  3 mL Intravenous Q12H   sucralfate   1 g Oral QID   thiamine   100 mg Oral Q1200   Continuous:  feeding supplement (OSMOLITE 1.2 CAL)     PRN:clonazePAM , fentaNYL  (SUBLIMAZE ) injection, methocarbamol , naLOXone  (NARCAN )  injection, ondansetron  (ZOFRAN ) IV, mouth rinse, sodium chloride  flush, SUMAtriptan   Antibiotics: Anti-infectives (From admission, onward)    None       Objective:  Vital Signs  Vitals:   10/03/23 0211 10/03/23 0305 10/03/23 0400 10/03/23 0500  BP: (!) 141/99  (!) 95/55   Pulse: 71  71   Resp: 10  11   Temp: 98.4 F (36.9 C) 97.7 F (36.5 C)    TempSrc: Oral Oral    SpO2: 99%  100%   Weight:    61.2 kg  Height:        Intake/Output Summary (Last 24 hours) at 10/03/2023 0903 Last data filed at 10/03/2023 0559 Gross per 24 hour  Intake 470 ml  Output 0 ml  Net 470 ml   Filed Weights   10/02/23 0306 10/02/23 0710 10/03/23 0500  Weight: 61.2 kg 61.2 kg 61.2 kg    General appearance: Awake alert.  In no distress Resp: Clear to auscultation bilaterally.  Normal effort Cardio: S1-S2 is normal regular.  No S3-S4.  No rubs murmurs or bruit GI: Abdomen is soft.  Tender diffusely without any rebound rigidity or guarding.  G-tube is present and noted to be normal externally. Extremities: No edema.  Full range of motion of lower extremities. Neurologic: Alert and oriented x3.  No focal neurological deficits.    Lab Results:  Data Reviewed: I have personally reviewed following labs and reports of the imaging studies  CBC: Recent Labs  Lab 09/30/23 2322 10/01/23 0450 10/01/23 1321 10/02/23 0305 10/02/23 1157  10/03/23 0329  WBC 14.2* 9.1  --  9.5 8.7 7.1  NEUTROABS  --  5.4  --   --   --   --   HGB 5.6* 5.4* 6.3* 9.5* 9.8* 9.2*  HCT 18.1* 16.9* 19.2* 28.4* 30.5* 28.4*  MCV 95.8 96.0  --  91.6 92.4 93.7  PLT 442* 306  --  315 375 368    Basic Metabolic Panel: Recent Labs  Lab 09/30/23 2322 10/01/23 0450 10/02/23 0305 10/03/23 0329  NA 135 138 138 138  K 3.6 3.4* 4.1 4.5  CL 105 109 109 106  CO2 23 25 24 27   GLUCOSE 125* 101* 87 81  BUN 23* 21* 8 13  CREATININE 0.73 0.43* <0.30* 0.56  CALCIUM  9.2 8.3* 8.7* 8.9  MG  --  2.2 2.2  --   PHOS  --   --  3.6 5.3*    GFR: Estimated Creatinine Clearance: 85.8 mL/min (by C-G formula based on SCr of 0.56 mg/dL).  Liver Function Tests: Recent Labs  Lab 09/30/23 2322 10/01/23 0450  AST 20 17  ALT 16 14  ALKPHOS 48 38  BILITOT 0.3 0.3  PROT 7.1 5.6*  ALBUMIN  3.7 3.0*    Recent Labs  Lab 09/30/23 2322  LIPASE 24     Recent Results (from the past 240 hours)  MRSA Next Gen by PCR, Nasal     Status: None   Collection Time: 10/01/23  7:42 PM   Specimen: Nasal Mucosa; Nasal Swab  Result Value Ref Range Status   MRSA by PCR Next Gen NOT DETECTED NOT DETECTED Final    Comment: (NOTE) The GeneXpert MRSA Assay (FDA approved for NASAL specimens only), is one component of a comprehensive MRSA colonization surveillance program. It is not intended to diagnose MRSA infection nor to guide or monitor treatment for MRSA infections. Test performance is not FDA approved in patients less than 74 years old. Performed at [facility], [address] [place], [place] [address]       Radiology Studies: No results found.      LOS: 2 days   Starlena Beil  [facility] Pager on [email or web address]  10/03/2023, 9:03 AM

## 2023-10-03 NOTE — Progress Notes (Signed)
 Tx pt to 1507 via wheelchair. Applied new dressing to peg.

## 2023-10-03 NOTE — Plan of Care (Signed)
  Problem: Clinical Measurements: Goal: Ability to maintain clinical measurements within normal limits will improve Outcome: Progressing Goal: Will remain free from infection Outcome: Progressing Goal: Diagnostic test results will improve Outcome: Progressing   Problem: Activity: Goal: Risk for activity intolerance will decrease Outcome: Progressing   Problem: Coping: Goal: Level of anxiety will decrease Outcome: Progressing   Problem: Pain Management: Goal: General experience of comfort will improve Outcome: Progressing   Problem: Safety: Goal: Ability to remain free from injury will improve Outcome: Progressing   Problem: Skin Integrity: Goal: Risk for impaired skin integrity will decrease Outcome: Progressing

## 2023-10-03 NOTE — Plan of Care (Signed)
  Problem: Clinical Measurements: Goal: Ability to maintain clinical measurements within normal limits will improve Outcome: Progressing Goal: Will remain free from infection Outcome: Progressing Goal: Diagnostic test results will improve Outcome: Progressing Goal: Respiratory complications will improve Outcome: Progressing   Problem: Nutrition: Goal: Adequate nutrition will be maintained Outcome: Progressing Note: Tube feedings started and diet upgraded to regular diet.  Patient is tolerating both.   Problem: Elimination: Goal: Will not experience complications related to urinary retention Outcome: Progressing

## 2023-10-04 DIAGNOSIS — F418 Other specified anxiety disorders: Secondary | ICD-10-CM | POA: Diagnosis not present

## 2023-10-04 DIAGNOSIS — K922 Gastrointestinal hemorrhage, unspecified: Secondary | ICD-10-CM | POA: Diagnosis not present

## 2023-10-04 DIAGNOSIS — D62 Acute posthemorrhagic anemia: Secondary | ICD-10-CM | POA: Diagnosis not present

## 2023-10-04 DIAGNOSIS — E538 Deficiency of other specified B group vitamins: Secondary | ICD-10-CM | POA: Diagnosis not present

## 2023-10-04 LAB — BASIC METABOLIC PANEL
Anion gap: 5 (ref 5–15)
BUN: 17 mg/dL (ref 6–20)
CO2: 29 mmol/L (ref 22–32)
Calcium: 8.8 mg/dL — ABNORMAL LOW (ref 8.9–10.3)
Chloride: 102 mmol/L (ref 98–111)
Creatinine, Ser: 0.64 mg/dL (ref 0.44–1.00)
GFR, Estimated: >60 mL/min (ref >60)
Glucose, Bld: 116 mg/dL — ABNORMAL HIGH (ref 70–99)
Potassium: 4.1 mmol/L (ref 3.5–5.1)
Sodium: 136 mmol/L (ref 135–145)

## 2023-10-04 LAB — TYPE AND SCREEN
ABO/RH(D): O POS
Antibody Screen: NEGATIVE
Unit division: 0
Unit division: 0
Unit division: 0
Unit division: 0

## 2023-10-04 LAB — BPAM RBC
Blood Product Expiration Date: 202502012359
Blood Product Expiration Date: 202502012359
Blood Product Expiration Date: 202502012359
Blood Product Expiration Date: 202502022359
ISSUE DATE / TIME: 202501030258
ISSUE DATE / TIME: 202501030729
ISSUE DATE / TIME: 202501031527
ISSUE DATE / TIME: 202501032107
Unit Type and Rh: 5100
Unit Type and Rh: 5100
Unit Type and Rh: 5100
Unit Type and Rh: 5100

## 2023-10-04 LAB — GLUCOSE, CAPILLARY
Glucose-Capillary: 101 mg/dL — ABNORMAL HIGH (ref 70–99)
Glucose-Capillary: 114 mg/dL — ABNORMAL HIGH (ref 70–99)
Glucose-Capillary: 133 mg/dL — ABNORMAL HIGH (ref 70–99)
Glucose-Capillary: 86 mg/dL (ref 70–99)
Glucose-Capillary: 86 mg/dL (ref 70–99)
Glucose-Capillary: 94 mg/dL (ref 70–99)

## 2023-10-04 LAB — VITAMIN B1: Vitamin B1 (Thiamine): 160.7 nmol/L (ref 66.5–200.0)

## 2023-10-04 LAB — CBC
HCT: 29.1 % — ABNORMAL LOW (ref 36.0–46.0)
Hemoglobin: 9.1 g/dL — ABNORMAL LOW (ref 12.0–15.0)
MCH: 29.7 pg (ref 26.0–34.0)
MCHC: 31.3 g/dL (ref 30.0–36.0)
MCV: 95.1 fL (ref 80.0–100.0)
Platelets: 407 10*3/uL — ABNORMAL HIGH (ref 150–400)
RBC: 3.06 MIL/uL — ABNORMAL LOW (ref 3.87–5.11)
RDW: 14.6 % (ref 11.5–15.5)
WBC: 7.6 10*3/uL (ref 4.0–10.5)
nRBC: 0 % (ref 0.0–0.2)

## 2023-10-04 LAB — COPPER, SERUM: Copper: 103 ug/dL (ref 80–158)

## 2023-10-04 LAB — ZINC: Zinc: 63 ug/dL (ref 44–115)

## 2023-10-04 LAB — PHOSPHORUS: Phosphorus: 4.9 mg/dL — ABNORMAL HIGH (ref 2.5–4.6)

## 2023-10-04 MED ORDER — OSMOLITE 1.2 CAL PO LIQD
1000.0000 mL | ORAL | Status: DC
  Administered 2023-10-04: 1000 mL
  Filled 2023-10-04 (×2): qty 1000

## 2023-10-04 MED ORDER — DICYCLOMINE HCL 10 MG PO CAPS
10.0000 mg | ORAL_CAPSULE | Freq: Three times a day (TID) | ORAL | Status: AC
Start: 2023-10-04 — End: 2023-10-04
  Administered 2023-10-04 (×3): 10 mg via ORAL
  Filled 2023-10-04 (×3): qty 1

## 2023-10-04 MED ORDER — DICYCLOMINE HCL 10 MG PO CAPS
10.0000 mg | ORAL_CAPSULE | Freq: Once | ORAL | Status: AC
  Administered 2023-10-05: 10 mg via ORAL
  Filled 2023-10-04: qty 1

## 2023-10-04 NOTE — TOC Initial Note (Signed)
 Transition of Care [facility]) - Initial/Assessment Note    Patient Details  Name: [name] MRN: [id number] Date of Birth: 09-17-1976  Transition of Care [facility]) CM/SW Contact:    Katherine Katherine Bigness, LCSW Phone Number: 10/04/2023, 12:34 PM  Clinical Narrative:                 Pt from home with 46y.o. son. Pt has cane and RW at home and receives tube feeds through [facility]. Pt has no PCP listed however, reports she sees [name] at [facility].  Met with pt at bedside to review SDOH concerns. Pt reports her only source of income is from disability income. She shares her disability income was recently cut and she no longer makes enough to cover her rent. She reports not being able to pay December rent until 12/30. She reports being unable to afford food and utilities. Pt's son provides transportation for pt as able however, is in school and also works. Pt is agreeable to having referrals to community agencies made through [facility]. Referrals have been made to community agencies for rent, utility, food, transportation assistance. Additional resources have been placed on pt's AVS.  Pt recommended for HHPT. Per pt she was supposed to have services started for home health nursing on last hospital admit however, reports that she never began services. HHPT/RN has been arranged with [facility]. HH orders will have to be placed prior to discharge. Pt is also recommended to have a rollator at discharge. Pt agreeable to having DME ordered however, wants to ensure insurance will cover DME. [employer] has been contacted for rollator and currently awaiting response on insurance coverage.  CSW contacted [employer] to inform of tube feed changes. Order form for tube feeds placed on pt's chart to be signed by MD and sent back to [employer]. MD notified.   Expected Discharge Plan: Home w Home Health Services Barriers to Discharge: Continued Medical Work up   Patient Goals and CMS Choice Patient states their goals for  this hospitalization and ongoing recovery are:: To return home CMS Medicare.gov Compare Post Acute Care list provided to:: Patient Choice offered to / list presented to : Patient [place] ownership interest in [facility].provided to::  (NA)    Expected Discharge Plan and Services In-house Referral: Clinical Social Work Discharge Planning Services: NA Post Acute Care Choice: Home Health, Durable Medical Equipment Living arrangements for the past 2 months: Apartment                           HH Arranged: PT, RN HH Agency: [facility] Care Date [facility] Agency Contacted: 10/04/23 Time HH Agency Contacted: 1234 Representative spoke with at [facility] Agency: Cindie  Prior Living Arrangements/Services Living arrangements for the past 2 months: Apartment Lives with:: Minor Children Patient language and need for interpreter reviewed:: Yes Do you feel safe going back to the place where you live?: Yes      Need for Family Participation in Patient Care: No (Comment) Care giver support system in place?: Yes (comment) Current home services: DME (Walker, cane) Criminal Activity/Legal Involvement Pertinent to Current Situation/Hospitalization: No - Comment as needed  Activities of Daily Living   ADL Screening (condition at time of admission) Independently performs ADLs?: Yes (appropriate for developmental age) Is the patient deaf or have difficulty hearing?: No Does the patient have difficulty seeing, even when wearing glasses/contacts?: Yes Does the patient have difficulty concentrating, remembering, or making  decisions?: No  Permission Sought/Granted   Permission granted to share information with : Yes, Verbal Permission Granted     Permission granted to share info w AGENCY: [facility]        Emotional Assessment Appearance:: Appears stated age Attitude/Demeanor/Rapport: Engaged Affect (typically observed): Accepting Orientation: : Oriented to Self, Oriented to Place, Oriented  to  Time, Oriented to Situation Alcohol / Substance Use: Not Applicable Psych Involvement: No (comment)  Admission diagnosis:  Acute blood loss anemia [D62] Acute upper GI bleed [K92.2] Hematemesis with nausea [K92.0] Patient Active Problem List   Diagnosis Date Noted   Acute upper GI bleed 10/01/2023   ABLA (acute blood loss anemia) 10/01/2023   Hyperlipidemia 10/01/2023   Mild protein malnutrition (HCC) 10/01/2023   Feeding intolerance 09/14/2023   Orthostatic hypotension 08/04/2023   Upper GI bleed 07/25/2023   Abdominal pain with vomiting 01/26/2023   Depression with anxiety 01/26/2023   Enteritis 01/26/2023   Malnutrition of moderate degree (HCC) 01/20/2023   Normocytic anemia 01/20/2023   Intractable nausea and vomiting 01/18/2023   Intractable abdominal pain 01/17/2023   Pituitary microadenoma (HCC) 06/11/2022   Status post implantation of urinary electronic stimulator device 01/13/2022   Yeast infection 02/07/2020   History of urinary urgency 02/07/2020   Urinary frequency 02/07/2020   Dysuria 02/07/2020   Chronic migraine without aura, with intractable migraine, so stated, with status migrainosus 02/05/2020   S/P right TKA 01/23/2020   Status post total knee replacement, right 01/23/2020   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   Chronic low back pain 10/25/2019   Headache 05/04/2019   Gait instability 05/04/2019   Suicidal ideations    MDD (major depressive disorder), recurrent severe, 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   Peripheral neuropathy 03/01/2019   Right carpal tunnel syndrome 03/01/2019   Chronic constipation 01/26/2019   S/P left TKA 03/10/2018   Degeneration of lumbar intervertebral disc 02/12/2018   Osteoarthritis of left knee 02/10/2018   B12 deficiency 12/01/2017   Malnutrition, calorie (HCC) 03/27/2017   Psychophysiological insomnia 03/18/2017   Paresthesia 07/22/2016   Chronic headache disorder 07/22/2016   Memory difficulty 07/22/2016   Upper GI  bleeding 04/04/2016   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 [place] [address] Phone: [phone] Fax: [phone]  [facility] [place], [place] - [address] [place] [address] Phone: [phone] Fax: [phone]     Social Drivers of Health (SDOH) Social History: SDOH Screenings   Food Insecurity: Food Insecurity Present (10/01/2023)  Housing: High Risk (10/01/2023)  Transportation Needs: Unmet Transportation Needs (10/01/2023)  Utilities: At Risk (10/01/2023)  Alcohol Screen: Medium Risk (04/08/2019)  Depression (PHQ2-9): High Risk (08/25/2023)  Financial Resource Strain: Low Risk  (04/08/2019)  Physical Activity: Inactive (04/08/2019)  Social Connections: Unknown (01/29/2022)   [facility]  Stress: Stress Concern Present (04/08/2019)  Tobacco Use: Medium Risk (09/30/2023)   SDOH Interventions:     Readmission Risk Interventions    09/15/2023  11:43 AM 07/30/2023   11:59 AM  Readmission Risk Prevention Plan  Transportation Screening Complete Complete  PCP or Specialist Appt within 5-7 Days  Complete  PCP or Specialist Appt within 3-5 Days Complete   Home Care Screening  Complete  Medication Review (RN CM)  Complete  HRI or Home Care Consult Complete   Social Work Consult for Recovery Care Planning/Counseling Complete   Palliative Care Screening Not Applicable    Medication Review Ocean[employer]) Complete

## 2023-10-04 NOTE — Plan of Care (Signed)
  Problem: Education: Goal: Knowledge of General Education information will improve Description: Including pain rating scale, medication(s)/side effects and non-pharmacologic comfort measures Outcome: Progressing   Problem: Activity: Goal: Risk for activity intolerance will decrease Outcome: Progressing   Problem: Nutrition: Goal: Adequate nutrition will be maintained Outcome: Progressing   Problem: Coping: Goal: Level of anxiety will decrease Outcome: Progressing   Problem: Pain Management: Goal: General experience of comfort will improve Outcome: Progressing   Problem: Safety: Goal: Ability to remain free from injury will improve Outcome: Progressing

## 2023-10-04 NOTE — Plan of Care (Signed)

## 2023-10-04 NOTE — Progress Notes (Signed)
 [facility] PROGRESS NOTE   [name] FMW:[id number] DOB: 02-21-1976 DOA: 09/30/2023  PCP: Elizbeth Leita Ruth, FNP  Brief History: 48 y.o. 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  Postsurgical course was complicated by marginal ulcer, poor nutritional status requiring placement of gastrostomy tube.  Also has history of posterior anal fissure, anxiety, asthma, bronchitis, chronic headaches, history of C. difficile infection, dehydration, depression, GERD, hiatal hernia, history of UTIs, joint swelling/pain, peripheral neuropathy, postoperative nausea and vomiting, right carpal/tunnel syndrome, ulcerative colitis, tobacco use, vertigo, history of PUD who presented to the emergency department complaints of having a fall with after multiple episodes of emesis complicated by several episodes of hematemesis.  She was hospitalized for further management.   Consultants: Gastroenterology  Procedures:  Upper endoscopy 10/02/23 Findings:      The examined esophagus was normal.      Gastric pouch was inflamed but otherwise normal.      Two non-bleeding cratered ulcers with pigmented material were found at       the gastrojejunal anastomosis. The largest lesion was 15 mm in largest       dimension.      The examined efferent jejunal limb was normal.      No old or fresh blood seen to the extent of our examination.    Subjective/Interval History: Patient mentions that she is feeling better.  Has ambulated in the hallway without any difficulty.  Still feels a bit fatigued.  Has noticed some leakage from around her G-tube site.  No other complaints offered.  No nausea or vomiting.  Tolerating oral intake as well.  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  Recently had a G-tube placement by general  surgery. Seen by gastroenterology.  Underwent upper endoscopy.  Gastric pouch was inflamed but otherwise normal.  2 nonbleeding cratered ulcers were noted at the gastrojejunal anastomosis. Hemoglobin noted to be stable.  No further recurrence of coffee-ground emesis. Diet was advanced.  Tube feedings were resumed. General surgery was consulted.  To be evaluated by them today. Some leakage noted around the G-tube site which can be evaluated by general surgery.  Acute blood loss anemia/history of iron  deficiency She had a hemoglobin of 8.6 on 12/19.  Came in with a hemoglobin of 5.6.  Transfused 2 PRBCs with improvement in hemoglobin. Hemoglobin has been stable for the most part.  Hypokalemia Supplemented.  History of B12 deficiency On intramuscular B12 supplementation 2 times a week.  Chronic pain syndrome Home medication list shows that she is on buprenorphine  twice a day along with hydromorphone  on as needed.  This is being continued here in the hospital. Patient mentions that she is followed by pain management at [facility].  History of depression with anxiety Continue home medications.  Peripheral neuropathy Continue gabapentin .  Hyperlipidemia Continue statin.  History of migraines Noted to be Atogepant  which is used for migraine prophylaxis.  History of constipation Continue with home medication regimen.   DVT Prophylaxis: SCDs Code Status: Full code Family Communication: Discussed with patient Disposition Plan: Anticipate discharge home when cleared by general surgery.  Status is: Inpatient Remains inpatient appropriate because: Acute blood loss anemia, concern for GI bleeding      Medications: Scheduled:  Atogepant   30 mg Oral Daily   atorvastatin   40 mg Oral q1800   Buprenorphine  HCl  75 mcg Sublingual BID   buPROPion   450 mg Oral q morning   busPIRone   15 mg Oral TID   Chlorhexidine  Gluconate Cloth  6 each Topical Daily   dicyclomine   10 mg  Oral TID AC & HS   DULoxetine   60 mg Oral BID   famotidine   20 mg Oral BID   free water   100 mL Per Tube Q4H   gabapentin   600 mg Oral TID   HYDROmorphone   4 mg Oral Q3H   linaclotide   290 mcg Oral QAC breakfast   magnesium  oxide  400 mg Oral Daily   meclizine   25 mg Oral TID   mesalamine   2.4 g Oral Q breakfast   midodrine   15 mg Oral TID WC   Naldemedine Tosylate   0.2 mg Oral Daily   mouth rinse  15 mL Mouth Rinse 4 times per day   oxybutynin   10 mg Oral QHS   pantoprazole  (PROTONIX ) IV  40 mg Intravenous Q12H   Ensure Max Protein  11 oz Oral BID   sodium chloride  flush  3 mL Intravenous Q12H   sucralfate   1 g Oral QID   thiamine   100 mg Oral Q1200   Continuous:  feeding supplement (OSMOLITE 1.2 CAL) 50 mL/hr at 10/03/23 2127   PRN:clonazePAM , fentaNYL  (SUBLIMAZE ) injection, methocarbamol , naLOXone  (NARCAN )  injection, ondansetron  (ZOFRAN ) IV, mouth rinse, mouth rinse, sodium chloride  flush, SUMAtriptan   Antibiotics: Anti-infectives (From admission, onward)    None       Objective:  Vital Signs  Vitals:   10/03/23 2214 10/04/23 0221 10/04/23 0500 10/04/23 0612  BP: 102/71 102/62  107/66  Pulse: 82 78  95  Resp: 17 17  18   Temp: 98.7 F (37.1 C) 99.1 F (37.3 C)  98.8 F (37.1 C)  TempSrc:      SpO2: 100% 100%  100%  Weight:   58.8 kg   Height:        Intake/Output Summary (Last 24 hours) at 10/04/2023 0950 Last data filed at 10/03/2023 2127 Gross per 24 hour  Intake 988.67 ml  Output --  Net 988.67 ml   Filed Weights   10/02/23 0710 10/03/23 0500 10/04/23 0500  Weight: 61.2 kg 61.2 kg 58.8 kg    General appearance: Awake alert.  In no distress Resp: Clear to auscultation bilaterally.  Normal effort Cardio: S1-S2 is normal regular.  No S3-S4.  No rubs murmurs or bruit GI: Abdomen is soft.  Mildly tender without any rebound rigidity or guarding.  G-tube is noted. Extremities: No edema.  Full range of motion of lower extremities. Neurologic: Alert and  oriented x3.  No focal neurological deficits.    Lab Results:  Data Reviewed: I have personally reviewed following labs and reports of the imaging studies  CBC: Recent Labs  Lab 10/01/23 0450 10/01/23 1321 10/02/23 0305 10/02/23 1157 10/03/23 0329 10/04/23 0533  WBC 9.1  --  9.5 8.7 7.1 7.6  NEUTROABS 5.4  --   --   --   --   --   HGB 5.4* 6.3* 9.5* 9.8* 9.2* 9.1*  HCT 16.9* 19.2* 28.4* 30.5* 28.4* 29.1*  MCV 96.0  --  91.6 92.4 93.7 95.1  PLT 306  --  315 375 368 407*    Basic Metabolic Panel: Recent Labs  Lab 09/30/23 2322 10/01/23 0450 10/02/23 0305 10/03/23 0329 10/04/23 0533  NA 135 138 138 138 136  K 3.6 3.4* 4.1 4.5 4.1  CL 105 109 109 106 102  CO2  23 25 24 27 29   GLUCOSE 125* 101* 87 81 116*  BUN 23* 21* 8 13 17   CREATININE 0.73 0.43* <0.30* 0.56 0.64  CALCIUM  9.2 8.3* 8.7* 8.9 8.8*  MG  --  2.2 2.2  --   --   PHOS  --   --  3.6 5.3* 4.9*    GFR: Estimated Creatinine Clearance: 82.4 mL/min (by C-G formula based on SCr of 0.64 mg/dL).  Liver Function Tests: Recent Labs  Lab 09/30/23 2322 10/01/23 0450  AST 20 17  ALT 16 14  ALKPHOS 48 38  BILITOT 0.3 0.3  PROT 7.1 5.6*  ALBUMIN  3.7 3.0*    Recent Labs  Lab 09/30/23 2322  LIPASE 24     Recent Results (from the past 240 hours)  MRSA Next Gen by PCR, Nasal     Status: None   Collection Time: 10/01/23  7:42 PM   Specimen: Nasal Mucosa; Nasal Swab  Result Value Ref Range Status   MRSA by PCR Next Gen NOT DETECTED NOT DETECTED Final    Comment: (NOTE) The GeneXpert MRSA Assay (FDA approved for NASAL specimens only), is one component of a comprehensive MRSA colonization surveillance program. It is not intended to diagnose MRSA infection nor to guide or monitor treatment for MRSA infections. Test performance is not FDA approved in patients less than 22 years old. Performed at [facility], [address] [place], [place] [address]       Radiology Studies: No  results found.      LOS: 3 days   Deone Leifheit  [facility] Pager on [email or web address]  10/04/2023, 9:50 AM

## 2023-10-04 NOTE — Discharge Instructions (Signed)
 FOOD PANTRY [employer] [address] [phone]  [facility] [address] [place] [phone]  [facility] - Food Distribution Center [address] [place] [phone]  [facility] [address] [phone]  [facility] - Food Distribution Center [address] [place], [place] [address] [phone]  UTILITIES [facility] [address] [place] [phone] Rental assistance/rental hotline: [phone] ext. 340.    Utility assistance/utility hotline: [phone] ext. [address] Department of IT consultant (heating/cooling and water assistance) [phone] (rental and utility assistance) [phone]  [employer] - call 211  TRANSPORTATION [facility] [address] [place], [place] [address] [phone]  [employer] [place] I-Ride Reservations Line: [phone]     Passengers can simply call the [employer] reservations number at [phone] for pickup. However, with [employer] same-day service is available with at least two hour notice Monday through Friday. You will need your [employer] [place] client ID# when you call for reservations. If you do not know it, you can call [phone] to request it. [employer] offers a flat fare of $8.50 per trip. This will cover travel anywhere within the city limits of [place].

## 2023-10-04 NOTE — Evaluation (Signed)
 Physical Therapy Evaluation Patient Details Name: [name] MRN: [id number] DOB: February 26, 1976 Today's Date: 10/04/2023  History of Present Illness  Pt is 48 yo female admitted on 09/29/22 with concern for upper GI bleed, anemia, hypokalemia.  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  Postsurgical course was complicated by marginal ulcer, poor nutritional status requiring placement of gastrostomy tube.  Also has history of posterior anal fissure, anxiety, asthma, bronchitis, chronic headaches, history of C. difficile infection, dehydration, depression, GERD, hiatal hernia, history of UTIs, joint swelling/pain, peripheral neuropathy, postoperative nausea and vomiting, right carpal/tunnel syndrome, ulcerative colitis, tobacco use, vertigo, history of PUD  Clinical Impression  Pt admitted with above diagnosis. At baseline, pt is ambulatory with RW or cane, she has some assist/supervision for ADLs.  She does fall weekly -reports lightheaded and weak legs contributing to falls.  Pt has been ambulating in room at mod I level.  She ambulated 400' with PT using RW but did demonstrate LE weakness.  Pt will benefit from acute PT services to advance mobility, strength, and safety.  Could benefit from either HHPT or outpt PT at d/c but reports difficulty with transportation to outpt.   Pt currently with functional limitations due to the deficits listed below (see PT Problem List). Pt will benefit from acute skilled PT to increase their independence and safety with mobility to allow discharge.            If plan is discharge home, recommend the following: Assistance with cooking/housework;Help with stairs or ramp for entrance   Can travel by private vehicle        Equipment Recommendations Rollator (4 wheels)  Recommendations for Other Services       Functional Status Assessment Patient has had a recent decline in their functional  status and demonstrates the ability to make significant improvements in function in a reasonable and predictable amount of time.     Precautions / Restrictions Precautions Precautions: Fall Precaution Comments: Pt falls at least 3x a week      Mobility  Bed Mobility Overal bed mobility: Modified Independent             General bed mobility comments: Uses rail    Transfers Overall transfer level: Needs assistance Equipment used: None Transfers: Sit to/from Stand Sit to Stand: Supervision           General transfer comment: STS from bed and toilet x 2 during session.  Pt has been ambulating to restroom on her own    Ambulation/Gait Ambulation/Gait assistance: Contact guard assist, Supervision Gait Distance (Feet): 400 Feet (400 then 15) Assistive device: Rolling walker (2 wheels) Gait Pattern/deviations: Step-through pattern, Narrow base of support, Trendelenburg, Scissoring Gait velocity: decreased     General Gait Details: Pt with good RW proximity.  Noted at times pt would trendelenburg (either hip) resulting in narrow BOS and scissoring.  She reports that is how she falls sometimes. Started CGA progressed to supervision except back to CGA after using toilet (tried to have BM, reports did strain some) felt lightheaded.  Provided CGA back to bed.  Symptoms resolving with return to supine.  BP in supine was 113/65.  Stairs            Wheelchair Mobility     Tilt Bed    Modified Rankin (Stroke Patients Only)       Balance Overall balance assessment: Needs assistance, History of Falls Sitting-balance support:  Feet supported, No upper extremity supported Sitting balance-Leahy Scale: Normal       Standing balance-Leahy Scale: Fair Standing balance comment: Varied support throughout session.  Could static stand and ADLs without UE support.  Walking some and reaching outside BOS with single UE support.  RW to walk                              Pertinent Vitals/Pain Pain Assessment Pain Assessment: Faces Faces Pain Scale: Hurts a little bit Pain Location: stomach at operative sites Pain Descriptors / Indicators: Aching, Discomfort, Sore Pain Intervention(s): Limited activity within patient's tolerance, Monitored during session    Home Living Family/patient expects to be discharged to:: Private residence Living Arrangements: Children Available Help at Discharge: Family Type of Home: Apartment Home Access: Stairs to enter Entrance Stairs-Rails: None Entrance Stairs-Number of Steps: 1 at the door   Home Layout: One level Home Equipment: Cane - single Librarian, Academic (2 wheels) Additional Comments: pt would like a rollator; 17yo and 85 yo sons live with her    Prior Function Prior Level of Function : History of Falls (last six months);Needs assist             Mobility Comments: Pt uses cane unless she is walking the dog then uses walker.  Says she ties the dogs to the walker and they do not pull the walker out from under her.  Pt falls at least 3x week and sometimes needs assist getting up - reports falls from lightheadedness or just weak/legs give out.  Reports mobility/adls getting harder - feels that she is getting weaker ADLs Comments: kids assist with all cooking and cleaning.  Kids assist helping  into shower and sometimes help with bathing and dressing. Pt sometimes drives     Extremity/Trunk Assessment   Upper Extremity Assessment Upper Extremity Assessment: Overall WFL for tasks assessed    Lower Extremity Assessment Lower Extremity Assessment: LLE deficits/detail;RLE deficits/detail RLE Deficits / Details: ROM WFL; MMT 4/5 LLE Deficits / Details: ROM WFL; MMT 4/5    Cervical / Trunk Assessment Cervical / Trunk Assessment: Normal  Communication      Cognition Arousal: Alert Behavior During Therapy: WFL for tasks assessed/performed Overall Cognitive Status: Within Functional Limits for tasks  assessed                                          General Comments General comments (skin integrity, edema, etc.): Educated on HEP : SLR, hip abd (sidelying if able), STS, bridging (if not painful), heel raises.  Pt reports she is familiar and has done these in past.  Pt has been ambulating to bathroom independently - discussed could continue but if feeling lightheaded needs to sit down and call staff.    Exercises     Assessment/Plan    PT Assessment Patient needs continued PT services  PT Problem List Decreased strength;Decreased range of motion;Decreased activity tolerance;Decreased balance;Decreased mobility;Decreased knowledge of use of DME       PT Treatment Interventions Balance training;Gait training;Functional mobility training;Therapeutic exercise;Therapeutic activities;Patient/family education;DME instruction;Stair training;Neuromuscular re-education    PT Goals (Current goals can be found in the Care Plan section)  Acute Rehab PT Goals Patient Stated Goal: return home, get stronger PT Goal Formulation: With patient Time For Goal Achievement: 10/18/23 Potential to Achieve Goals: Good  Frequency Min 1X/week     Co-evaluation               AM-PAC PT 6 Clicks Mobility  Outcome Measure Help needed turning from your back to your side while in a flat bed without using bedrails?: None Help needed moving from lying on your back to sitting on the side of a flat bed without using bedrails?: None Help needed moving to and from a bed to a chair (including a wheelchair)?: None Help needed standing up from a chair using your arms (e.g., wheelchair or bedside chair)?: None Help needed to walk in hospital room?: A Little Help needed climbing 3-5 steps with a railing? : A Little 6 Click Score: 22    End of Session Equipment Utilized During Treatment: Gait belt Activity Tolerance: Patient tolerated treatment well Patient left: in bed;with call  bell/phone within reach Nurse Communication: Mobility status PT Visit Diagnosis: Other abnormalities of gait and mobility (R26.89);Muscle weakness (generalized) (M62.81)    Time: 1010-1100 PT Time Calculation (min) (ACUTE ONLY): 50 min   Charges:   PT Evaluation $PT Eval Low Complexity: 1 Low PT Treatments $Gait Training: 8-22 mins $Therapeutic Activity: 8-22 mins PT General Charges $$ ACUTE PT VISIT: 1 Visit         Benjiman, PT Acute Rehab [facility] [phone]   Benjiman VEAR [place] 10/04/2023, 11:10 AM

## 2023-10-05 ENCOUNTER — Encounter (HOSPITAL_COMMUNITY): Payer: Self-pay | Admitting: Gastroenterology

## 2023-10-05 DIAGNOSIS — F418 Other specified anxiety disorders: Secondary | ICD-10-CM | POA: Diagnosis not present

## 2023-10-05 DIAGNOSIS — K922 Gastrointestinal hemorrhage, unspecified: Secondary | ICD-10-CM | POA: Diagnosis not present

## 2023-10-05 DIAGNOSIS — E538 Deficiency of other specified B group vitamins: Secondary | ICD-10-CM | POA: Diagnosis not present

## 2023-10-05 DIAGNOSIS — D62 Acute posthemorrhagic anemia: Secondary | ICD-10-CM | POA: Diagnosis not present

## 2023-10-05 LAB — GLUCOSE, CAPILLARY
Glucose-Capillary: 100 mg/dL — ABNORMAL HIGH (ref 70–99)
Glucose-Capillary: 104 mg/dL — ABNORMAL HIGH (ref 70–99)
Glucose-Capillary: 117 mg/dL — ABNORMAL HIGH (ref 70–99)
Glucose-Capillary: 90 mg/dL (ref 70–99)
Glucose-Capillary: 98 mg/dL (ref 70–99)
Glucose-Capillary: 99 mg/dL (ref 70–99)

## 2023-10-05 LAB — CBC
HCT: 29.6 % — ABNORMAL LOW (ref 36.0–46.0)
Hemoglobin: 9.3 g/dL — ABNORMAL LOW (ref 12.0–15.0)
MCH: 30.5 pg (ref 26.0–34.0)
MCHC: 31.4 g/dL (ref 30.0–36.0)
MCV: 97 fL (ref 80.0–100.0)
Platelets: 410 10*3/uL — ABNORMAL HIGH (ref 150–400)
RBC: 3.05 MIL/uL — ABNORMAL LOW (ref 3.87–5.11)
RDW: 14.4 % (ref 11.5–15.5)
WBC: 6.8 10*3/uL (ref 4.0–10.5)
nRBC: 0 % (ref 0.0–0.2)

## 2023-10-05 LAB — BASIC METABOLIC PANEL
Anion gap: 6 (ref 5–15)
BUN: 18 mg/dL (ref 6–20)
CO2: 27 mmol/L (ref 22–32)
Calcium: 8.7 mg/dL — ABNORMAL LOW (ref 8.9–10.3)
Chloride: 103 mmol/L (ref 98–111)
Creatinine, Ser: 0.63 mg/dL (ref 0.44–1.00)
GFR, Estimated: >60 mL/min (ref >60)
Glucose, Bld: 88 mg/dL (ref 70–99)
Potassium: 4.1 mmol/L (ref 3.5–5.1)
Sodium: 136 mmol/L (ref 135–145)

## 2023-10-05 LAB — VITAMIN E
Vitamin E (Alpha Tocopherol): 9.2 mg/L (ref 7.0–25.1)
Vitamin E(Gamma Tocopherol): 0.6 mg/L (ref 0.5–5.5)

## 2023-10-05 LAB — VITAMIN C: Vitamin C: 0.5 mg/dL (ref 0.4–2.0)

## 2023-10-05 LAB — VITAMIN A: Vitamin A (Retinoic Acid): 32 ug/dL (ref 20.1–62.0)

## 2023-10-05 LAB — VITAMIN B6: Vitamin B6: 5.2 ug/L (ref 3.4–65.2)

## 2023-10-05 LAB — PHOSPHORUS: Phosphorus: 4.5 mg/dL (ref 2.5–4.6)

## 2023-10-05 MED ORDER — OMEPRAZOLE 2 MG/ML ORAL SUSPENSION: 20.0000 mg | Freq: Two times a day (BID) | ORAL | 0 refills | Status: DC

## 2023-10-05 MED ORDER — OSMOLITE 1.5 CAL PO LIQD
1000.0000 mL | ORAL | Status: DC
  Administered 2023-10-05: 1000 mL
  Filled 2023-10-05 (×4): qty 1000

## 2023-10-05 NOTE — Progress Notes (Signed)
 The following images are available in Power[employer]:   07/25/2023 CTA-AP  09/15/2023 UGI  10/01/2023 CTA-AP  This RN entered orders to store the above PowerShare images in Epic.

## 2023-10-05 NOTE — Consult Note (Signed)
 Consulting Physician: Deward PARAS Abia Monaco  Referring Provider: Dr. Verdene  Chief Complaint: Bleeding chronic marginal ulcer  Reason for Consult: Bleeding chronic marginal ulcer   Subjective   HPI: SWAYZE KOZUCH is an 48 y.o. 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 She presented on 09/30/22, EGD showed large jejunal ulcers without active hemorrhage on 10/01/22.  She has responded to resuscitation and now is in need of definitive management of this chronic ulcer.  Past Medical History:  Diagnosis Date   Anal fissure - posterior 10/16/2014    OCC  ISSUES   Anxiety    doesn't take anything   Asthma    has Albuterol  inhaler as needed   Bronchitis    in winter   Chronic headache disorder 07/22/2016   Clostridium difficile infection 04/20/2012   Dehydration    Depression    Family history of adverse reaction to anesthesia    pt mom gets sick   GERD (gastroesophageal reflux disease)    takes Pantoprazole  daily   Headache    Hiatal hernia    neuropathy - mild in arms and legs   History of blood transfusion    last transfusion was 04/04/2016=Benadryl  was given d/t itching. States she always itches with transfusion.    History of migraine    last one 05/01/16   History of stomach ulcers    History of urinary tract infection LAST 2 WEEKS AGO   IDA (iron  deficiency anemia)    Internal and external bleeding hemorrhoids 06/11/2014   Joint pain    Joint swelling    Left sided chronic colitis - segmental 06/11/2014   Marginal ulcer 10/25/2017   Motion sickness    Nausea    takes Zofran  as needed   Nausea and vomiting    for 1 year   Oligouria    Osteoarthritis    lower back, knees, wrists - no meds   Peripheral neuropathy 03/01/2019   Personal history of gastric bypass    Postoperative nausea and vomiting 01/21/2016   wants scopolamine  patch   Right carpal tunnel syndrome 03/01/2019   SVD (spontaneous vaginal delivery)    x 4   TOBACCO USER  10/02/2009   Qualifier: Diagnosis of  By: Lida MD, Artist     Tremor 08/31/2018   UC (ulcerative colitis) (HCC)    supposed to be taking Lialda  and Bentyl  but has been off since gastric sleeve   Unsteady gait when walking 2024   uses a walker   Vertigo    doesn't take any meds    Past Surgical History:  Procedure Laterality Date   ABDOMINAL HYSTERECTOMY     PARTIAL   BIOPSY  01/20/2023   Procedure: BIOPSY;  Surgeon: Saintclair Jasper, MD;  Location: [facility];  Service: Gastroenterology;;   BLADDER STIMULATOR IMPLANT     COLONOSCOPY  2007   for rectal bleeding; [facility]   COLONOSCOPY N/A 06/11/2014   Procedure: COLONOSCOPY;  Surgeon: Lupita FORBES Commander, MD;  [facility]: [facility];  Service: Endoscopy;  Laterality: N/A;   COLONOSCOPY WITH PROPOFOL  N/A 03/02/2017   Procedure: COLONOSCOPY WITH PROPOFOL ;  Surgeon: Ethyl Lenis, MD;  Location: [facility];  Service: General;  Laterality: N/A;   DILATION AND CURETTAGE OF UTERUS     ESOPHAGOGASTRODUODENOSCOPY N/A 07/25/2023   Procedure: ESOPHAGOGASTRODUODENOSCOPY (EGD);  Surgeon: Kriss Estefana DEL, DO;  Location: [facility];  Service: Gastroenterology;  Laterality: N/A;   ESOPHAGOGASTRODUODENOSCOPY (EGD) WITH PROPOFOL  N/A 04/06/2016   Procedure: ESOPHAGOGASTRODUODENOSCOPY (  EGD) WITH PROPOFOL ;  Surgeon: Alm Angle, MD;  Location: [facility];  Service: General;  Laterality: N/A;   ESOPHAGOGASTRODUODENOSCOPY (EGD) WITH PROPOFOL  N/A 03/02/2017   Procedure: ESOPHAGOGASTRODUODENOSCOPY (EGD) WITH PROPOFOL ;  Surgeon: Angle Alm, MD;  Location: [facility];  Service: General;  Laterality: N/A;   ESOPHAGOGASTRODUODENOSCOPY (EGD) WITH PROPOFOL  N/A 05/20/2017   Procedure: ESOPHAGOGASTRODUODENOSCOPY (EGD) WITH PROPOFOL  ERAS PATHWAY;  Surgeon: Angle Alm, MD;  Location: [facility];  Service: General;  Laterality: N/A;   ESOPHAGOGASTRODUODENOSCOPY (EGD) WITH PROPOFOL  N/A 10/07/2017   Procedure: ESOPHAGOGASTRODUODENOSCOPY (EGD) WITH PROPOFOL ;   Surgeon: Angle Alm, MD;  Location: [facility];  Service: General;  Laterality: N/A;   ESOPHAGOGASTRODUODENOSCOPY (EGD) WITH PROPOFOL  N/A 01/20/2023   Procedure: ESOPHAGOGASTRODUODENOSCOPY (EGD) WITH PROPOFOL ;  Surgeon: Saintclair Jasper, MD;  [facility];  Service: Gastroenterology;  Laterality: N/A;   EXCISION OF SKIN TAG  06/08/2017   Procedure: EXCISION OF VULVAR SKIN TAGS X2;  Surgeon: Fredirick Glenys RAMAN, MD;  Location: [facility];  Service: Gynecology;;   FOOT SURGERY Bilateral    x 2   GASTRIC ROUX-EN-Y N/A 12/14/2016   Procedure: LAPAROSCOPIC REVISION SLEEVE GASTRECTOMY TO  ROUX-Y-GASTRIC BY-PASS, UPPER ENDO;  Surgeon: Morene Olives, MD;  [facility];  Service: General;  Laterality: N/A;   GASTROJEJUNOSTOMY N/A 05/11/2016   Procedure: LAPAROSCOPIC PLACEMENT  OF FEEDING  JEJUNOSTOMY TUBE;  Surgeon: Morene Olives, MD;  [facility];  Service: General;  Laterality: N/A;   HEMORRHOIDECTOMY WITH HEMORRHOID BANDING     HEMOSTASIS CLIP PLACEMENT  07/25/2023   Procedure: HEMOSTASIS CLIP PLACEMENT;  Surgeon: Kriss Estefana DEL, DO;  [facility];  Service: Gastroenterology;;   HOT HEMOSTASIS N/A 07/25/2023   Procedure: HOT HEMOSTASIS (ARGON PLASMA COAGULATION/BICAP);  Surgeon: Kriss Estefana DEL, DO;  Location: [facility];  Service: Gastroenterology;  Laterality: N/A;   IR FLUORO GUIDE CV LINE RIGHT  04/06/2017   IR GASTROSTOMY TUBE MOD SED  08/25/2023   IR GENERIC HISTORICAL  05/19/2016   IR CM INJ ANY COLONIC TUBE W/FLUORO 05/19/2016 Marcey Moan, MD [facility]   IR PATIENT EVAL TECH 0-60 MINS  11/30/2017   IR REPLC DUODEN/JEJUNO TUBE PERCUT W/FLUORO  03/14/2018   IR REPLC DUODEN/JEJUNO TUBE PERCUT W/FLUORO  03/15/2018   IR US  GUIDE VASC ACCESS LEFT  08/25/2023   IR US  GUIDE VASC ACCESS LEFT  08/25/2023   IR US  GUIDE VASC ACCESS RIGHT  04/06/2017   j tube removed march 2018     KNEE ARTHROSCOPY Left 09/06/2014   LAPAROSCOPIC GASTRIC SLEEVE RESECTION N/A  01/14/2016   Procedure: LAPAROSCOPIC GASTRIC SLEEVE RESECTION;  Surgeon: Morene Olives, MD;  [facility];  Service: General;  Laterality: N/A;   LAPAROSCOPIC INSERTION GASTROSTOMY TUBE N/A 09/14/2023   Procedure: LAPAROSCOPIC REMNANT GASTROSTOMY TUBE PLACEMENT;  Surgeon: Lyndel Deward PARAS, MD;  [facility];  Service: General;  Laterality: N/A;   LAPAROSCOPIC REVISION OF GASTROJEJUNOSTOMY N/A 10/25/2017   Procedure: LAPAROSCOPIC REVISION OF GASTROJEJUNOSTOMY AND PARTIAL GASTRECTOMY, WITH PLACEMENT OF FEEDING GASTROSTOMY TUBE;  Surgeon: Olives Morene, MD;  [facility];  Service: General;  Laterality: N/A;   LAPAROSCOPIC TUBAL LIGATION  10/16/2011   Procedure: LAPAROSCOPIC TUBAL LIGATION;  Surgeon: Nena DELENA App, MD;  [facility];  Service: Gynecology;  Laterality: Bilateral;   NOVASURE ABLATION  09/28/2010   mild persistent vaginal bleeding   right knee arthroscopy     05/07/2016   TOTAL KNEE ARTHROPLASTY Left 03/10/2018   Procedure: LEFT TOTAL KNEE ARTHROPLASTY;  Surgeon: Ernie Cough, MD;  Location:  [facility];  Service: Orthopedics;  Laterality: Left;  70 mins   TOTAL KNEE ARTHROPLASTY Right 01/23/2020   Procedure: TOTAL KNEE ARTHROPLASTY, BILATERAL TROCHANTERIC INJECTION;  Surgeon: Ernie Cough, MD;  [facility];  Service: Orthopedics;  Laterality: Right;  70 mins for Bilateral Troch injection 1 cc Depo and 2 CC Lidocaine    VAGINAL HYSTERECTOMY Bilateral 06/08/2017   Procedure: HYSTERECTOMY VAGINAL W/ BILATERAL SALPINGECTOMY;  Surgeon: Fredirick Glenys RAMAN, MD;  [facility];  Service: Gynecology;  Laterality: Bilateral;   wisdom teeth extracted      Family History  Problem Relation Age of Onset   Hypertension Mother    Diabetes Mother    Sarcoidosis Mother        lungs and skin   Asthma Mother    Hypertension Father    Diabetes Father    Asthma Son    Tics Son    Asthma Sister    Cancer Sister        possible pancreatic cancer   Adrenal  disorder Sister        Tumor    Asthma Brother    Asthma Daughter        died age 105.5   Cancer Daughter 4       brain; died age 105.5   Asthma Son    Cancer Paternal Aunt        brain, colon, lung and esophagus; unsure of primary    Breast cancer Paternal Aunt     Social:  reports that she quit smoking about 9 years ago. Her smoking use included cigarettes. She started smoking about 29 years ago. She has a 10 pack-year smoking history. She has never used smokeless tobacco. She reports that she does not currently use alcohol. She reports that she does not use drugs.  Allergies:  Allergies  Allergen Reactions   Ciprofloxacin  Itching and Rash   Coconut (Cocos Nucifera) Anaphylaxis and Itching   Coconut Oil Anaphylaxis, Itching and Other (See Comments)   Morphine  And Codeine Anaphylaxis, Hives and Other (See Comments)    Pt states that she has tolerated Norco and Dilaudid    Oxycodone Anaphylaxis   Oxycodone-Acetaminophen  Anaphylaxis   Doxycycline Nausea And Vomiting and Other (See Comments)   Penicillamine Hives and Other (See Comments)   Trazodone  And Nefazodone Rash   Vistaril  [Hydroxyzine  Hcl] Hives   Silicone    Penicillins Rash    Has patient had a PCN reaction causing immediate rash, facial/tongue/throat swelling, SOB or lightheadedness with hypotension: Yes Has patient had a PCN reaction causing severe rash involving mucus membranes or skin necrosis: No Has patient had a PCN reaction that required hospitalization No Has patient had a PCN reaction occurring within the last 10 years: No If all of the above answers are NO, then may proceed with Cephalosporin use.   Tape Rash    And paper tape causes a rash if wearing for a prolong period of time    Medications: Current Outpatient Medications  Medication Instructions   acetaminophen  (TYLENOL ) 1,000 mg, Oral, Every 6 hours PRN   albuterol  (VENTOLIN  HFA) 108 (90 Base) MCG/ACT inhaler 2 puffs, Inhalation, Every 6 hours PRN    atorvastatin  (LIPITOR) 40 mg, Oral, Daily   Belbuca  75 mcg, 2 times daily   botulinum toxin Type A  (BOTOX ) 200 units injection INJECT 155 UNITS INTRAMUSCULARLY INTO HEAD AND NECK MUSCLES EVERY 3 MONTHS   buPROPion  (WELLBUTRIN  XL) 300 mg, Oral, Every morning, Take along with 150 mg=450 mg  busPIRone  (BUSPAR ) 15 mg, Oral, 3 times daily   clonazePAM  (KLONOPIN ) 1 mg, Oral, 2 times daily PRN   cyanocobalamin  (VITAMIN B12) 1,000 mcg, Intramuscular, 2 times weekly   diclofenac  Sodium (VOLTAREN ) 2 g, Topical, 4 times daily PRN   dicyclomine  (BENTYL ) 10 mg, Oral, 4 times daily   DULoxetine  (CYMBALTA ) 60 mg, Oral, 2 times daily   EPINEPHrine  (EPI-PEN) 0.3 mg, Intramuscular, As needed   famotidine  (PEPCID ) 20 mg, Oral, 2 times daily   ferrous sulfate  325 mg, Oral, Daily with breakfast   fluticasone  (FLONASE ) 50 MCG/ACT nasal spray 2 sprays, Each Nare, Daily PRN   gabapentin  (NEURONTIN ) 600 mg, Oral, 3 times daily   hydrocortisone  (ANUSOL -HC) 2.5 % rectal cream 1 Application, Rectal, Daily PRN   HYDROmorphone  (DILAUDID ) 4 mg, Oral, Every  3 hours   linaclotide  (LINZESS ) 290 mcg, Oral, Daily before breakfast   magnesium  oxide (MAG-OX) 400 mg, Oral, Daily   meclizine  (ANTIVERT ) 25 mg, Oral, 3 times daily   mesalamine  (LIALDA ) 2.4 g, Oral, Daily with breakfast   methocarbamol  (ROBAXIN -750) 750 mg, Oral, Every 6 hours PRN   midodrine  (PROAMATINE ) 15 mg, Oral, 3 times daily with meals   Multiple Vitamin (MULTIVITAMIN WITH MINERALS) TABS tablet 1 tablet, Oral, Daily   NARCAN  4 MG/0.1ML LIQD nasal spray kit 1 spray, Nasal, Daily PRN   Nutritional Supplements (FEEDING SUPPLEMENT, OSMOLITE 1.5 CAL,) LIQD 1,000 mLs, Per Tube, Continuous   omeprazole  (KONVOMEP) 20 mg, Oral, 2 times daily before meals   ondansetron  (ZOFRAN ) 8 mg, Oral, Every 8 hours PRN   oxybutynin  (DITROPAN ) 10 mg, Oral, Daily at bedtime   Qulipta  30 mg, Oral, Daily   sucralfate  (CARAFATE ) 1 g, Oral, Every 6 hours   SUMAtriptan  (IMITREX )  50 mg, Oral, Every 2 hours PRN, May repeat in 2 hours if headache persists or recurs.   Symproic  0.2 mg, Oral, Daily   thiamine  (VITAMIN B-1) 100 mg, Oral, Daily   torsemide  (DEMADEX ) 5 mg, Oral, Daily   valACYclovir  (VALTREX ) 1,000 mg, Oral, 2 times daily PRN   Vitamin D3 50,000 Units, Oral, Every 7 days    ROS - all of the below systems have been reviewed with the patient and positives are indicated with bold text General: chills, fever or night sweats Eyes: blurry vision or double vision ENT: epistaxis or sore throat Allergy/Immunology: itchy/watery eyes or nasal congestion Hematologic/Lymphatic: bleeding problems, blood clots or swollen lymph nodes Endocrine: temperature intolerance or unexpected weight changes Breast: new or changing breast lumps or nipple discharge Resp: cough, shortness of breath, or wheezing CV: chest pain or dyspnea on exertion GI: as per HPI GU: dysuria, trouble voiding, or hematuria MSK: joint pain or joint stiffness Neuro: TIA or stroke symptoms Derm: pruritus and skin lesion changes Psych: anxiety and depression  Objective   PE Blood pressure 119/83, pulse 80, temperature 98.3 F (36.8 C), resp. rate 20, height 5' 8.4 (1.737 m), weight 64.4 kg, last menstrual period 06/02/2017, SpO2 100%. Constitutional: NAD; conversant; no deformities Eyes: Moist conjunctiva; no lid lag; anicteric; PERRL Neck: Trachea midline; no thyromegaly Lungs: Normal respiratory effort; no tactile fremitus CV: RRR; no palpable thrills; no pitting edema GI: Abd Soft, nontender, G tube snugged down to assist with leaking issues; no palpable hepatosplenomegaly MSK: Normal range of motion of extremities; no clubbing/cyanosis Psychiatric: Appropriate affect; alert and oriented x3 Lymphatic: No palpable cervical or axillary lymphadenopathy  Results for orders placed or performed during the hospital encounter of 09/30/23 (from the past 24 hours)  Blood  transfusion report -  scanned     Status: None ()   Collection Time: 10/04/23 10:45 AM   Narrative   Ordered by an unspecified provider.  Blood transfusion report - scanned     Status: None   Collection Time: 10/04/23 10:45 AM   Narrative   Ordered by an unspecified provider.  Glucose, capillary     Status: None   Collection Time: 10/04/23 12:12 PM  Result Value Ref Range   Glucose-Capillary 86 70 - 99 mg/dL  Glucose, capillary     Status: None   Collection Time: 10/04/23  4:49 PM  Result Value Ref Range   Glucose-Capillary 94 70 - 99 mg/dL  Glucose, capillary     Status: Abnormal   Collection Time: 10/04/23  7:55 PM  Result Value Ref Range   Glucose-Capillary 114 (H) 70 - 99 mg/dL  Glucose, capillary     Status: None   Collection Time: 10/05/23 12:01 AM  Result Value Ref Range   Glucose-Capillary 98 70 - 99 mg/dL  Glucose, capillary     Status: None   Collection Time: 10/05/23  4:05 AM  Result Value Ref Range   Glucose-Capillary 90 70 - 99 mg/dL  CBC     Status: Abnormal   Collection Time: 10/05/23  5:26 AM  Result Value Ref Range   WBC 6.8 4.0 - 10.5 K/uL   RBC 3.05 (L) 3.87 - 5.11 MIL/uL   Hemoglobin 9.3 (L) 12.0 - 15.0 g/dL   HCT 70.3 (L) 63.9 - 53.9 %   MCV 97.0 80.0 - 100.0 fL   MCH 30.5 26.0 - 34.0 pg   MCHC 31.4 30.0 - 36.0 g/dL   RDW 85.5 88.4 - 84.4 %   Platelets 410 (H) 150 - 400 K/uL   nRBC 0.0 0.0 - 0.2 %  Basic metabolic panel     Status: Abnormal   Collection Time: 10/05/23  5:26 AM  Result Value Ref Range   Sodium 136 135 - 145 mmol/L   Potassium 4.1 3.5 - 5.1 mmol/L   Chloride 103 98 - 111 mmol/L   CO2 27 22 - 32 mmol/L   Glucose, Bld 88 70 - 99 mg/dL   BUN 18 6 - 20 mg/dL   Creatinine, Ser 9.36 0.44 - 1.00 mg/dL   Calcium  8.7 (L) 8.9 - 10.3 mg/dL   GFR, Estimated >39 >39 mL/min   Anion gap 6 5 - 15  Phosphorus     Status: None   Collection Time: 10/05/23  5:26 AM  Result Value Ref Range   Phosphorus 4.5 2.5 - 4.6 mg/dL  Glucose, capillary     Status: Abnormal    Collection Time: 10/05/23  7:59 AM  Result Value Ref Range   Glucose-Capillary 104 (H) 70 - 99 mg/dL     Imaging Orders         CT ABDOMEN PELVIS W CONTRAST         CT Head Wo Contrast         CT Cervical Spine Wo Contrast      Assessment and Plan   NARCISSUS DETWILER is an 48 y.o. female with chronic bleeding marginal ulcer.    She originally had a sleeve, then was converted to bypass for GERD issues, then underwent revision of GJ due to ulcer issues, and now has chronic bleeding marginal ulcer issues.  I was concerned malnutrition may have prevented this ulcer from responding to maximal medical therapy, also would increase her risks of  a large surgery so a remnant gastrostomy tube was placed in December.  Unfortunately, she continues to bleed and is in need of more definitive therapy for this ulcer.  Surgical options may include endoscopic oversewing, vagotomy, revision of gastrojejunostomy or gastric pouch resection with esophagojejunostomy.  I think she should be evaluated at a higher level of care with more options for this complicated situation than here at [facility].  I messaged the surgeons at [facility] in [place] and will update the team once I hear back from them.    Deward JINNY Foy, MD  [facility] Use [email or web address] to contact on call provider  New Patient Billing: [phone] - High MDM

## 2023-10-05 NOTE — TOC Progression Note (Signed)
 Transition of Care [facility]) - Progression Note    Patient Details  Name: [name] MRN: [id number] Date of Birth: 1976/07/11  [facility]) CM/SW Contact  [name], LCSW Phone Number: 10/05/2023, 12:50 PM  Clinical Narrative:    Tube feed order form signed by MD and returned to [employer].    Expected Discharge Plan: Home w Home Health Services Barriers to Discharge: Continued Medical Work up  Expected Discharge Plan and Services In-house Referral: Clinical Social Work Discharge Planning Services: NA Post Acute Care Choice: Home Health, Durable Medical Equipment Living arrangements for the past 2 months: Apartment                           HH Arranged: PT, RN HH Agency: [facility] Date [facility] Agency Contacted: 10/04/23 Time HH Agency Contacted: [phone] Representative spoke with at [facility] Agency: Cindie   Social Determinants of Health (SDOH) Interventions SDOH Screenings   Food Insecurity: Food Insecurity Present (10/01/2023)  Housing: High Risk (10/01/2023)  Transportation Needs: Unmet Transportation Needs (10/01/2023)  Utilities: At Risk (10/01/2023)  Alcohol Screen: Medium Risk (04/08/2019)  Depression (PHQ2-9): High Risk (08/25/2023)  Financial Resource Strain: Low Risk  (04/08/2019)  Physical Activity: Inactive (04/08/2019)  Social Connections: Unknown (01/29/2022)   [facility]  Stress: Stress Concern Present (04/08/2019)  Tobacco Use: Medium Risk (09/30/2023)    Readmission Risk Interventions    09/15/2023   11:43 AM 07/30/2023   11:59 AM  Readmission Risk Prevention Plan  Transportation Screening Complete Complete  PCP or Specialist Appt within 5-7 Days  Complete  PCP or Specialist Appt within 3-5 Days Complete   Home Care Screening  Complete  Medication Review (RN CM)  Complete  HRI or Home Care Consult Complete   Social Work Consult for Recovery Care Planning/Counseling Complete   Palliative Care Screening  Not Applicable   Medication Review Oceanographer) Complete

## 2023-10-05 NOTE — Progress Notes (Signed)
 Nutrition Follow-up  INTERVENTION:   -Advance to goal rate of Osmolite 1.5 @ 60 ml/hr via remnant G-tube -Free water  flushes: 100 ml every 4 hours (600 ml) -Provides 2160 kcals, 90g protein and 1697 ml H2O  -Vitamin labs pending: Vitamin A , E, K, B6  NUTRITION DIAGNOSIS:   Inadequate oral intake related to altered GI function as evidenced by other (comment) (pt with G-tube on nutrition support).  Ongoing.  GOAL:   Patient will meet greater than or equal to 90% of their needs  Progressing.  MONITOR:   PO intake, Labs, Weight trends, Skin, I & O's, TF tolerance  REASON FOR ASSESSMENT:   Consult Enteral/tube feeding initiation and management  ASSESSMENT:   48 y/o female with h/o c-diff, GERD, hiatal hernia, PUD, chronic migraine, hemorrhoids s/p surgical intervention (2006), IBS-C, left sided chronic colitis/segmental (dignosed 2015), IDA, anal fissures, DJD, anxiety, depression, morbid obesity s/p sleeve gastrectomy 01/14/2016 complicated by post op intractable nausea/vomiting s/p surgical J-tube placement 05/11/2016, s/p revision of sleeve gastrectomy to roux-en-y and J-tube removal 12/14/16 complicated by ulcers at the gastrojejunal anastomosis, menorrhagia s/p transvaginal hysterectomy and bilateral salpingectomy 06/08/2017 complicated by persistent intolerance of oral intake and reflux requiring PICC line and weekly visits to the sickle cell clinic for IV hydration s/p laparoscopic revision of gastrojunostomy with parital gastrctomy and G-tube placement 10/25/17 (eventually removed) and recent admission for ongoing gastric/jejunal ulcers and FTT s/p laparoscopic remnant G-tube placement 09/14/23 and who is now admitted with GIB.  12/17: s/p LAPAROSCOPIC REMNANT GASTROSTOMY TUBE PLACEMENT   Patient in room, receiving patient care at time of visit.  Patient receiving Osmolite 1.2 @ 50 ml/hr. Tube feeding recommendations were made over the weekend for Osmolite 1.5 @ 60 ml/hr. Will  switch order to this. Have alerted [employer] that pt should receive Osmolite 1.5 instead of Osmolite 1.2.   Admission weight: 133 lbs Current weight: 141 lbs  Medications: Pepcid , MAG-OX, Carafate , Thiamine   Labs reviewed: CBGs: 86-133  Vitamin C WNL Zinc  WNL Vitamin D  WNL Copper  WNL Thiamine  WNL  Diet Order:   Diet Order             Diet regular Room service appropriate? Yes; Fluid consistency: Thin  Diet effective now                   EDUCATION NEEDS:   Not appropriate for education at this time  Skin:  Skin Assessment: Reviewed RN Assessment  Last BM:  1/6 -type 7  Height:   Ht Readings from Last 1 Encounters:  10/02/23 5' 8.4 (1.737 m)    Weight:   Wt Readings from Last 1 Encounters:  10/05/23 64.4 kg    BMI:  Body mass index is 21.34 kg/m.  Estimated Nutritional Needs:   Kcal:  1900-2200kcal/day  Protein:  95-110g/day  Fluid:  1.9-2.2L/day   Morna Lee, MS, RD, LDN Inpatient Clinical Dietitian Contact via Secure chat

## 2023-10-05 NOTE — Plan of Care (Signed)
  Problem: Clinical Measurements: Goal: Ability to maintain clinical measurements within normal limits will improve Outcome: Progressing   Problem: Clinical Measurements: Goal: Will remain free from infection Outcome: Progressing   Problem: Nutrition: Goal: Adequate nutrition will be maintained Outcome: Progressing   

## 2023-10-05 NOTE — Discharge Summary (Signed)
 [facility]  Physician Discharge Summary   Patient ID: [name] MRN: [id number] DOB/AGE: 46/10/1975 46 y.o.  Admit date: 09/30/2023 Discharge date: 10/06/2023    PCP: Elizbeth Leita Ruth, FNP  DISCHARGE DIAGNOSES:    Acute upper GI bleed   B12 deficiency   Asthma   Hypokalemia   Anemia, iron  deficiency   S/P left TKA   Chronic constipation   Peripheral neuropathy   Depression with anxiety   ABLA (acute blood loss anemia)   Hyperlipidemia   Mild protein malnutrition (HCC)   RECOMMENDATIONS FOR OUTPATIENT FOLLOW UP: General surgery to arrange follow-up [facility] for further management of her abdominal issues   Home Health: RN Equipment/Devices: None  CODE STATUS: Full code  DISCHARGE CONDITION: fair  Diet recommendation: Tube feedings as before  INITIAL HISTORY: 48 y.o. 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  Postsurgical course was complicated by marginal ulcer, poor nutritional status requiring placement of gastrostomy tube.  Also has history of posterior anal fissure, anxiety, asthma, bronchitis, chronic headaches, history of C. difficile infection, dehydration, depression, GERD, hiatal hernia, history of UTIs, joint swelling/pain, peripheral neuropathy, postoperative nausea and vomiting, right carpal/tunnel syndrome, ulcerative colitis, tobacco use, vertigo, history of PUD who presented to the emergency department complaints of having a fall with after multiple episodes of emesis complicated by several episodes of hematemesis.  She was hospitalized for further management.    Consultants: Gastroenterology.  General surgery   Procedures:  Upper endoscopy 10/02/23 Findings:      The examined esophagus was normal.      Gastric pouch was inflamed but otherwise normal.      Two non-bleeding cratered ulcers with pigmented material were found at       the gastrojejunal  anastomosis. The largest lesion was 15 mm in largest       dimension.      The examined efferent jejunal limb was normal.      No old or fresh blood seen to the extent of our examination.  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  Recently had a G-tube placement by general surgery. Seen by gastroenterology.  Underwent upper endoscopy.  Gastric pouch was inflamed but otherwise normal.  2 nonbleeding cratered ulcers were noted at the gastrojejunal anastomosis. Hemoglobin noted to be stable.  No further recurrence of coffee-ground emesis. Diet was advanced.  Tube feedings were resumed.  PPI twice daily. Some leakage noted around the G-tube site which was managed by general surgery.  General surgery plans to refer the patient to surgeons at [facility] for further management in the outpatient setting.  Since patient has been otherwise stable she is okay for discharge today.     Acute blood loss anemia/history of iron  deficiency She had a hemoglobin of 8.6 on 12/19.  Came in with a hemoglobin of 5.6.  Transfused 2 PRBCs with improvement in hemoglobin. Hemoglobin has been stable for the most part.   Hypokalemia Supplemented.   History of B12 deficiency On intramuscular B12 supplementation 2 times a week.   Chronic pain syndrome Home medication list shows that she is on buprenorphine  twice a day along with hydromorphone  on as needed.  This is being continued here in the hospital. Patient mentions that she is followed by pain management at [facility].   History of depression with anxiety Continue home  medications.   Peripheral neuropathy Continue gabapentin .   Hyperlipidemia Continue statin.   History of migraines Noted to be Atogepant  which is used for migraine prophylaxis.   History of constipation Continue with home medication regimen.     Patient is stable.  Okay for discharge  home today.   PERTINENT LABS:  The results of significant diagnostics from this hospitalization (including imaging, microbiology, ancillary and laboratory) are listed below for reference.    Microbiology: Recent Results (from the past 240 hours)  MRSA Next Gen by PCR, Nasal     Status: None   Collection Time: 10/01/23  7:42 PM   Specimen: Nasal Mucosa; Nasal Swab  Result Value Ref Range Status   MRSA by PCR Next Gen NOT DETECTED NOT DETECTED Final    Comment: (NOTE) The GeneXpert MRSA Assay (FDA approved for NASAL specimens only), is one component of a comprehensive MRSA colonization surveillance program. It is not intended to diagnose MRSA infection nor to guide or monitor treatment for MRSA infections. Test performance is not FDA approved in patients less than 20 years old. Performed at [facility], [address] [place], [place] [address]      Labs:   Basic Metabolic Panel: Recent Labs  Lab 10/01/23 0450 10/02/23 0305 10/03/23 0329 10/04/23 0533 10/05/23 0526 10/06/23 0524  NA 138 138 138 136 136 138  K 3.4* 4.1 4.5 4.1 4.1 4.2  CL 109 109 106 102 103 102  CO2 25 24 27 29 27 28   GLUCOSE 101* 87 81 116* 88 95  BUN 21* 8 13 17 18 19   CREATININE 0.43* <0.30* 0.56 0.64 0.63 0.62  CALCIUM  8.3* 8.7* 8.9 8.8* 8.7* 8.7*  MG 2.2 2.2  --   --   --   --   PHOS  --  3.6 5.3* 4.9* 4.5 4.6   Liver Function Tests: Recent Labs  Lab 09/30/23 2322 10/01/23 0450  AST 20 17  ALT 16 14  ALKPHOS 48 38  BILITOT 0.3 0.3  PROT 7.1 5.6*  ALBUMIN  3.7 3.0*   Recent Labs  Lab 09/30/23 2322  LIPASE 24   CBC: Recent Labs  Lab 10/01/23 0450 10/01/23 1321 10/02/23 1157 10/03/23 0329 10/04/23 0533 10/05/23 0526 10/06/23 0524  WBC 9.1   < > 8.7 7.1 7.6 6.8 6.7  NEUTROABS 5.4  --   --   --   --   --   --   HGB 5.4*   < > 9.8* 9.2* 9.1* 9.3* 9.2*  HCT 16.9*   < > 30.5* 28.4* 29.1* 29.6* 29.7*  MCV 96.0   < > 92.4 93.7 95.1 97.0 96.1  PLT 306   < >  375 368 407* 410* 429*   < > = values in this interval not displayed.   CBG: Recent Labs  Lab 10/05/23 2002 10/06/23 0004 10/06/23 0405 10/06/23 0812 10/06/23 1148  GLUCAP 117* 116* 76 82 70     IMAGING STUDIES CT ABDOMEN PELVIS W CONTRAST Result Date: 10/01/2023 CLINICAL DATA:  Abdominal pain, acute, nonlocalized hematemesis EXAM: CT ABDOMEN AND PELVIS WITH CONTRAST TECHNIQUE: Multidetector CT imaging of the abdomen and pelvis was performed using the standard protocol following bolus administration of intravenous contrast. RADIATION DOSE REDUCTION: This exam was performed according to the departmental dose-optimization program which includes automated exposure control, adjustment of the mA and/or kV according to patient size and/or use of iterative reconstruction technique. CONTRAST:  OMNIPAQUE  IOHEXOL  300 MG/ML  SOLN COMPARISON:  CT abdomen  pelvis 01/17/2023, CT abdomen pelvis 01/30/2017, CT abdomen pelvis 11/19/2017, CT abdomen pelvis 12/11/2020 FINDINGS: Lower chest: Tiny hiatal hernia.  No acute abnormality. Hepatobiliary: No focal liver abnormality. No gallstones, gallbladder wall thickening, or pericholecystic fluid. No biliary dilatation. Pancreas: No focal lesion. Normal pancreatic contour. No surrounding inflammatory changes. No main pancreatic ductal dilatation. Spleen: Normal in size. There is a lobulated 2.1 cm hypodense lesion within the spleen with a density of 37 Hounsfield unit. Finding has been increasing in size since 11/19/2017. New from 01/30/2017. Adrenals/Urinary Tract: No adrenal nodule bilaterally. Bilateral kidneys enhance symmetrically. No hydronephrosis. No hydroureter. The urinary bladder is unremarkable. Stomach/Bowel: Roux-en-Y gastric bypass. Gastrostomy tube with tip and inflated balloon terminating within the bypassed gastric lumen. Outpouching along the bypassed gastric lumen with associated bowel wall thickening and adjacent non enhancement of the gastric  wall with associated perigastric fat stranding (8:42). Stomach is within normal limits. No evidence of bowel wall thickening or dilatation. Appendix appears normal. Vascular/Lymphatic: No abdominal aorta or iliac aneurysm. No abdominal, pelvic, or inguinal lymphadenopathy. Reproductive: Status post hysterectomy. No adnexal masses. Other: No intraperitoneal free fluid. No intraperitoneal free gas. No organized fluid collection. Musculoskeletal: No abdominal wall hernia or abnormality. No suspicious lytic or blastic osseous lesions. No acute displaced fracture. IMPRESSION: 1. 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 No definite perforation. 2. Gastrostomy tube in appropriate position within the bypassed gastric lumen. 3. Tiny hiatal hernia. 4. Indeterminate slow growing splenic lesion now measuring 2.1 cm. Electronically Signed   By: Morgane  Naveau M.D.   On: 10/01/2023 03:23   CT Head Wo Contrast Result Date: 10/01/2023 CLINICAL DATA:  Head trauma, moderate-severe; Neck trauma, midline tenderness (Age 71-64y) EXAM: CT HEAD WITHOUT CONTRAST CT CERVICAL SPINE WITHOUT CONTRAST TECHNIQUE: Multidetector CT imaging of the head and cervical spine was performed following the standard protocol without intravenous contrast. Multiplanar CT image reconstructions of the cervical spine were also generated. RADIATION DOSE REDUCTION: This exam was performed according to the departmental dose-optimization program which includes automated exposure control, adjustment of the mA and/or kV according to patient size and/or use of iterative reconstruction technique. COMPARISON:  CT head and C-spine 02/28/2023 FINDINGS: CT HEAD FINDINGS Brain: No evidence of large-territorial acute infarction. No parenchymal hemorrhage. No mass lesion. No extra-axial collection. Similar nonspecific calcification of the cerebellum. No mass effect or midline shift. No hydrocephalus. Basilar cisterns are patent. Vascular: No hyperdense  vessel. Skull: No acute fracture or focal lesion. Sinuses/Orbits: Paranasal sinuses and mastoid air cells are clear. The orbits are unremarkable. Other: None. CT CERVICAL SPINE FINDINGS Alignment: Normal. Skull base and vertebrae: No acute fracture. No aggressive appearing focal osseous lesion or focal pathologic process. Soft tissues and spinal canal: No prevertebral fluid or swelling. No visible canal hematoma. Upper chest: Unremarkable. Other: None. IMPRESSION: 1. No acute intracranial abnormality. 2. No acute displaced fracture or traumatic listhesis of the cervical spine. Electronically Signed   By: Morgane  Naveau M.D.   On: 10/01/2023 02:51   CT Cervical Spine Wo Contrast Result Date: 10/01/2023 CLINICAL DATA:  Head trauma, moderate-severe; Neck trauma, midline tenderness (Age 71-64y) EXAM: CT HEAD WITHOUT CONTRAST CT CERVICAL SPINE WITHOUT CONTRAST TECHNIQUE: Multidetector CT imaging of the head and cervical spine was performed following the standard protocol without intravenous contrast. Multiplanar CT image reconstructions of the cervical spine were also generated. RADIATION DOSE REDUCTION: This exam was performed according to the departmental dose-optimization program which includes automated exposure control, adjustment of the mA and/or kV according to patient size  and/or use of iterative reconstruction technique. COMPARISON:  CT head and C-spine 02/28/2023 FINDINGS: CT HEAD FINDINGS Brain: No evidence of large-territorial acute infarction. No parenchymal hemorrhage. No mass lesion. No extra-axial collection. Similar nonspecific calcification of the cerebellum. No mass effect or midline shift. No hydrocephalus. Basilar cisterns are patent. Vascular: No hyperdense vessel. Skull: No acute fracture or focal lesion. Sinuses/Orbits: Paranasal sinuses and mastoid air cells are clear. The orbits are unremarkable. Other: None. CT CERVICAL SPINE FINDINGS Alignment: Normal. Skull base and vertebrae: No acute  fracture. No aggressive appearing focal osseous lesion or focal pathologic process. Soft tissues and spinal canal: No prevertebral fluid or swelling. No visible canal hematoma. Upper chest: Unremarkable. Other: None. IMPRESSION: 1. No acute intracranial abnormality. 2. No acute displaced fracture or traumatic listhesis of the cervical spine. Electronically Signed   By: Morgane  Naveau M.D.   On: 10/01/2023 02:51   DG UGI W SINGLE CM (SOL OR THIN BA) Result Date: 09/15/2023 CLINICAL DATA:  History of sleeve gastrectomy subsequently converted to Roux-en-Y gastric bypass with chronic marginal ulcers at the gastrojejunostomy resistant to medical therapy. EXAM: UPPER GI SERIES WITH KUB TECHNIQUE: After obtaining a scout radiograph a routine upper GI series was performed using water -soluble contrast material. FLUOROSCOPY: Radiation Exposure Index (as provided by the fluoroscopic device): 1.20 mGy Kerma COMPARISON:  Small bowel series 01/27/2023. CTA abdomen and pelvis 07/25/2023. FINDINGS: The scout radiographs demonstrate postsurgical changes in the upper abdomen, and a percutaneous gastrostomy tube projects over the right mid abdomen (recently placed laparoscopically in the gastric remnant). A neural stimulator overlies the pelvis. Scattered bowel gas and stool are present without evidence of obstruction. The patient drank contrast without difficulty. The esophagus is normal in caliber without evidence of a stricture. Contrast passed readily into the gastric pouch and through the gastrojejunostomy without evidence of obstruction/delay or leak. Contrast also quickly transited the Roux limb and into more distal small bowel. There is no hiatal hernia. IMPRESSION: Status post gastric bypass. Unremarkable appearance of the gastric pouch and Roux limb without evidence of obstruction or leak. Electronically Signed   By: Dasie Hamburg M.D.   On: 09/15/2023 11:30    DISCHARGE EXAMINATION: Vitals:   10/05/23 1943 10/06/23  0351 10/06/23 0500 10/06/23 1226  BP: (!) 140/84 119/68  131/86  Pulse: 72 68  82  Resp: 19 17    Temp: 97.9 F (36.6 C) 98.2 F (36.8 C)  98.2 F (36.8 C)  TempSrc:    Oral  SpO2: 99% 100%  100%  Weight:   64.5 kg   Height:       General appearance: Awake alert.  In no distress Resp: Clear to auscultation bilaterally.  Normal effort Cardio: S1-S2 is normal regular.  No S3-S4.  No rubs murmurs or bruit GI: Abdomen is soft.  Nontender nondistended.  Bowel sounds are present normal.  No masses organomegaly.  G-tube is noted.    DISPOSITION: Home  Discharge Instructions     Call MD for:  difficulty breathing, headache or visual disturbances   Complete by: As directed    Call MD for:  extreme fatigue   Complete by: As directed    Call MD for:  persistant dizziness or light-headedness   Complete by: As directed    Call MD for:  persistant nausea and vomiting   Complete by: As directed    Call MD for:  severe uncontrolled pain   Complete by: As directed    Call MD for:  temperature >100.4  Complete by: As directed    Discharge instructions   Complete by: As directed    Please be sure to follow-up with your surgeon for definitive management.  Seek attention if your symptoms recur.  You were cared for by a hospitalist during your hospital stay. If you have any questions about your discharge medications or the care you received while you were in the hospital after you are discharged, you can call the unit and asked to speak with the hospitalist on call if the hospitalist that took care of you is not available. Once you are discharged, your primary care physician will handle any further medical issues. Please note that NO REFILLS for any discharge medications will be authorized once you are discharged, as it is imperative that you return to your primary care physician (or establish a relationship with a primary care physician if you do not have one) for your aftercare needs so that  they can reassess your need for medications and monitor your lab values. If you do not have a primary care physician, you can call [phone] for a physician referral.   Increase activity slowly   Complete by: As directed          Allergies as of 10/06/2023       Reactions   Ciprofloxacin  Itching, Rash   Coconut (cocos Nucifera) Anaphylaxis, Itching   Coconut Oil Anaphylaxis, Itching, Other (See Comments)   Morphine  And Codeine Anaphylaxis, Hives, Other (See Comments)   Pt states that she has tolerated Norco and Dilaudid    Oxycodone Anaphylaxis   Oxycodone-acetaminophen  Anaphylaxis   Doxycycline Nausea And Vomiting, Other (See Comments)   Penicillamine Hives, Other (See Comments)   Trazodone  And Nefazodone Rash   Vistaril  [hydroxyzine  Hcl] Hives   Silicone    Penicillins Rash   Has patient had a PCN reaction causing immediate rash, facial/tongue/throat swelling, SOB or lightheadedness with hypotension: Yes Has patient had a PCN reaction causing severe rash involving mucus membranes or skin necrosis: No Has patient had a PCN reaction that required hospitalization No Has patient had a PCN reaction occurring within the last 10 years: No If all of the above answers are NO, then may proceed with Cephalosporin use.   Tape Rash   And paper tape causes a rash if wearing for a prolong period of time        Medication List     TAKE these medications    acetaminophen  500 MG tablet Commonly known as: TYLENOL  Take 2 tablets (1,000 mg total) by mouth every 6 (six) hours as needed.   albuterol  108 (90 Base) MCG/ACT inhaler Commonly known as: VENTOLIN  HFA Inhale 2 puffs into the lungs every 6 (six) hours as needed for wheezing or shortness of breath.   atorvastatin  40 MG tablet Commonly known as: LIPITOR Take 40 mg by mouth daily.   Belbuca  75 MCG Film Generic drug: Buprenorphine  HCl Place 75 mcg under the tongue in the morning and at bedtime.   Botox  200 units  injection Generic drug: botulinum toxin Type A  INJECT 155 UNITS INTRAMUSCULARLY INTO HEAD AND NECK MUSCLES EVERY 3 MONTHS   buPROPion  300 MG 24 hr tablet Commonly known as: WELLBUTRIN  XL Take 300 mg by mouth every morning. Take along with 150 mg=450 mg   busPIRone  15 MG tablet Commonly known as: BUSPAR  Take 15 mg by mouth 3 (three) times daily.   clonazePAM  1 MG tablet Commonly known as: KLONOPIN  Take 1 mg by mouth 2 (two) times daily as needed  for anxiety.   cyanocobalamin  1000 MCG/ML injection Commonly known as: VITAMIN B12 Inject 1,000 mcg into the muscle 2 (two) times a week.   diclofenac  Sodium 1 % Gel Commonly known as: VOLTAREN  Apply 2 g topically 4 (four) times daily as needed (Pain).   dicyclomine  10 MG capsule Commonly known as: BENTYL  Take 10 mg by mouth 4 (four) times daily.   DULoxetine  60 MG capsule Commonly known as: CYMBALTA  Take 1 capsule (60 mg total) by mouth 2 (two) times daily.   EPINEPHrine  0.3 mg/0.3 mL Soaj injection Commonly known as: EPI-PEN Inject 0.3 mg into the muscle as needed for anaphylaxis.   famotidine  20 MG tablet Commonly known as: PEPCID  Take 20 mg by mouth 2 (two) times daily.   feeding supplement (OSMOLITE 1.5 CAL) Liqd Place 1,000 mLs into feeding tube continuous.   ferrous sulfate  325 (65 FE) MG tablet Take 1 tablet (325 mg total) by mouth daily with breakfast.   fluticasone  50 MCG/ACT nasal spray Commonly known as: FLONASE  Place 2 sprays into both nostrils daily as needed for allergies or rhinitis.   gabapentin  600 MG tablet Commonly known as: NEURONTIN  Take 600 mg by mouth 3 (three) times daily.   hydrocortisone  2.5 % rectal cream Commonly known as: ANUSOL -HC Place 1 Application rectally daily as needed for hemorrhoids.   HYDROmorphone  8 MG tablet Commonly known as: DILAUDID  Take 0.5 tablets (4 mg total) by mouth every 3 (three) hours.   linaclotide  290 MCG Caps capsule Commonly known as: LINZESS  Take 290 mcg  by mouth daily before breakfast.   magnesium  oxide 400 (240 Mg) MG tablet Commonly known as: MAG-OX Take 400 mg by mouth daily.   meclizine  25 MG tablet Commonly known as: ANTIVERT  Take 25 mg by mouth 3 (three) times daily.   mesalamine  1.2 g EC tablet Commonly known as: LIALDA  Take 2.4 g by mouth daily with breakfast.   methocarbamol  750 MG tablet Commonly known as: Robaxin -750 Take 1 tablet (750 mg total) by mouth every 6 (six) hours as needed for muscle spasms.   midodrine  5 MG tablet Commonly known as: PROAMATINE  Take 15 mg by mouth 3 (three) times daily with meals.   multivitamin with minerals Tabs tablet Take 1 tablet by mouth daily.   Narcan  4 MG/0.1ML Liqd nasal spray kit Generic drug: naloxone  Place 1 spray into the nose daily as needed (overdose).   omeprazole  2 mg/mL Susp oral suspension Commonly known as: KONVOMEP Take 10 mLs (20 mg total) by mouth 2 (two) times daily before a meal.   ondansetron  8 MG tablet Commonly known as: ZOFRAN  Take 8 mg by mouth every 8 (eight) hours as needed for nausea or vomiting.   oxybutynin  5 MG tablet Commonly known as: DITROPAN  Take 10 mg by mouth at bedtime.   Qulipta  30 MG Tabs Generic drug: Atogepant  Take 1 tablet (30 mg total) by mouth daily.   sucralfate  1 g tablet Commonly known as: CARAFATE  Take 1 tablet (1 g total) by mouth every 6 (six) hours. What changed: when to take this   SUMAtriptan  50 MG tablet Commonly known as: Imitrex  Take 1 tablet (50 mg total) by mouth every 2 (two) hours as needed for migraine. May repeat in 2 hours if headache persists or recurs.   Symproic  0.2 MG Tabs Generic drug: Naldemedine Tosylate  Take 0.2 mg by mouth daily.   thiamine  100 MG tablet Commonly known as: Vitamin B-1 Take 100 mg by mouth daily at 12 noon.   torsemide  5 MG  tablet Commonly known as: DEMADEX  Take 5 mg by mouth daily.   valACYclovir  1000 MG tablet Commonly known as: VALTREX  Take 1,000 mg by mouth 2  (two) times daily as needed.   Vitamin D3 1.25 MG (50000 UT) Caps Take 50,000 Units by mouth every 7 (seven) days.          Follow-up Information     [facility] Follow up.   Specialty: Home Health Services Why: Hedda will follow up with you at discharge to provide home health services Contact information: [address] [place] [address] [phone]         Stechschulte, Deward PARAS, MD Follow up.   Specialty: Surgery Contact information: [address] [place] [address] [phone]         Elizbeth Leita Ruth, FNP. Schedule an appointment as soon as possible for a visit.   Specialty: Endocrinology Why: post hospitalization follow up Contact information: [address] [place] [address] [phone]                 TOTAL DISCHARGE TIME: 35 minutes  Kris No  [facility] Pager on [email or web address]  10/07/2023, 11:34 AM

## 2023-10-05 NOTE — Progress Notes (Signed)
 Received message from Dr. Lucille at [facility] in [place].  She will set up an appointment to evaluate the patient as an outpatient in the coming weeks.  Please contact surgery team for any additional questions.  Deward JINNY Foy, MD General, Bariatric and Minimally Invasive Surgery [facility]

## 2023-10-05 NOTE — Progress Notes (Signed)
 [facility] PROGRESS NOTE   [name] FMW:[id number] DOB: 10/29/1975 DOA: 09/30/2023  PCP: Elizbeth Leita Ruth, FNP  Brief History: 48 y.o. 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  Postsurgical course was complicated by marginal ulcer, poor nutritional status requiring placement of gastrostomy tube.  Also has history of posterior anal fissure, anxiety, asthma, bronchitis, chronic headaches, history of C. difficile infection, dehydration, depression, GERD, hiatal hernia, history of UTIs, joint swelling/pain, peripheral neuropathy, postoperative nausea and vomiting, right carpal/tunnel syndrome, ulcerative colitis, tobacco use, vertigo, history of PUD who presented to the emergency department complaints of having a fall with after multiple episodes of emesis complicated by several episodes of hematemesis.  She was hospitalized for further management.   Consultants: Gastroenterology.  General surgery  Procedures:  Upper endoscopy 10/02/23 Findings:      The examined esophagus was normal.      Gastric pouch was inflamed but otherwise normal.      Two non-bleeding cratered ulcers with pigmented material were found at       the gastrojejunal anastomosis. The largest lesion was 15 mm in largest       dimension.      The examined efferent jejunal limb was normal.      No old or fresh blood seen to the extent of our examination.    Subjective/Interval History: Patient feels better.  Denies any new complaints.  Still experiencing some leakage around the G-tube site.  No nausea vomiting.  Tolerating her diet.  Has been ambulating in the hallway.    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  Recently had a G-tube placement by general surgery. Seen by gastroenterology.  Underwent upper endoscopy.  Gastric  pouch was inflamed but otherwise normal.  2 nonbleeding cratered ulcers were noted at the gastrojejunal anastomosis. Hemoglobin noted to be stable.  No further recurrence of coffee-ground emesis. Diet was advanced.  Tube feedings were resumed.  PPI twice daily. Some leakage noted around the G-tube site which can be evaluated by general surgery. Discussed with general surgery today who will evaluate patient.  They plan to discuss her care and plan with the surgeons at [facility].  Acute blood loss anemia/history of iron  deficiency She had a hemoglobin of 8.6 on 12/19.  Came in with a hemoglobin of 5.6.  Transfused 2 PRBCs with improvement in hemoglobin. Hemoglobin has been stable for the most part.  Hypokalemia Supplemented.  History of B12 deficiency On intramuscular B12 supplementation 2 times a week.  Chronic pain syndrome Home medication list shows that she is on buprenorphine  twice a day along with hydromorphone  on as needed.  This is being continued here in the hospital. Patient mentions that she is followed by pain management at [facility].  History of depression with anxiety Continue home medications.  Peripheral neuropathy Continue gabapentin .  Hyperlipidemia Continue statin.  History of migraines Noted to be Atogepant  which is used for migraine prophylaxis.  History of constipation Continue with home medication regimen.  Did have a bowel movement yesterday.   DVT Prophylaxis: SCDs Code Status: Full code Family Communication: Discussed with patient Disposition Plan: Anticipate discharge home when cleared by general surgery.  Status is: Inpatient Remains inpatient appropriate because: Acute blood loss anemia, concern for GI bleeding      Medications: Scheduled:  Atogepant   30 mg Oral Daily  atorvastatin   40 mg Oral q1800   Buprenorphine  HCl  75 mcg Sublingual BID   buPROPion   450 mg Oral q morning   busPIRone   15 mg Oral TID   Chlorhexidine   Gluconate Cloth  6 each Topical Daily   DULoxetine   60 mg Oral BID   famotidine   20 mg Oral BID   free water   100 mL Per Tube Q4H   gabapentin   600 mg Oral TID   HYDROmorphone   4 mg Oral Q3H   linaclotide   290 mcg Oral QAC breakfast   magnesium  oxide  400 mg Oral Daily   meclizine   25 mg Oral TID   mesalamine   2.4 g Oral Q breakfast   midodrine   15 mg Oral TID WC   Naldemedine Tosylate   0.2 mg Oral Daily   mouth rinse  15 mL Mouth Rinse 4 times per day   oxybutynin   10 mg Oral QHS   pantoprazole  (PROTONIX ) IV  40 mg Intravenous Q12H   Ensure Max Protein  11 oz Oral BID   sodium chloride  flush  3 mL Intravenous Q12H   sucralfate   1 g Oral QID   thiamine   100 mg Oral Q1200   Continuous:  feeding supplement (OSMOLITE 1.2 CAL) 1,000 mL (10/04/23 1552)   PRN:clonazePAM , fentaNYL  (SUBLIMAZE ) injection, methocarbamol , naLOXone  (NARCAN )  injection, ondansetron  (ZOFRAN ) IV, mouth rinse, mouth rinse, sodium chloride  flush, SUMAtriptan    Objective:  Vital Signs  Vitals:   10/04/23 1101 10/04/23 1938 10/05/23 0410 10/05/23 0453  BP: 113/63 131/85  119/83  Pulse: 96 80  80  Resp: 16 19  20   Temp: 99.7 F (37.6 C) 98.4 F (36.9 C)  98.3 F (36.8 C)  TempSrc:      SpO2: 99% 98%  100%  Weight:   64.4 kg   Height:       No intake or output data in the 24 hours ending 10/05/23 1109  Filed Weights   10/03/23 0500 10/04/23 0500 10/05/23 0410  Weight: 61.2 kg 58.8 kg 64.4 kg    General appearance: Awake alert.  In no distress Resp: Clear to auscultation bilaterally.  Normal effort Cardio: S1-S2 is normal regular.  No S3-S4.  No rubs murmurs or bruit GI: Abdomen is soft.  G-tube is noted.  Mildly tender without any rebound rigidity or guarding.   Lab Results:  Data Reviewed: I have personally reviewed following labs and reports of the imaging studies  CBC: Recent Labs  Lab 10/01/23 0450 10/01/23 1321 10/02/23 0305 10/02/23 1157 10/03/23 0329 10/04/23 0533 10/05/23 0526   WBC 9.1  --  9.5 8.7 7.1 7.6 6.8  NEUTROABS 5.4  --   --   --   --   --   --   HGB 5.4*   < > 9.5* 9.8* 9.2* 9.1* 9.3*  HCT 16.9*   < > 28.4* 30.5* 28.4* 29.1* 29.6*  MCV 96.0  --  91.6 92.4 93.7 95.1 97.0  PLT 306  --  315 375 368 407* 410*   < > = values in this interval not displayed.    Basic Metabolic Panel: Recent Labs  Lab 10/01/23 0450 10/02/23 0305 10/03/23 0329 10/04/23 0533 10/05/23 0526  NA 138 138 138 136 136  K 3.4* 4.1 4.5 4.1 4.1  CL 109 109 106 102 103  CO2 25 24 27 29 27   GLUCOSE 101* 87 81 116* 88  BUN 21* 8 13 17 18   CREATININE 0.43* <0.30* 0.56 0.64 0.63  CALCIUM  8.3*  8.7* 8.9 8.8* 8.7*  MG 2.2 2.2  --   --   --   PHOS  --  3.6 5.3* 4.9* 4.5    GFR: Estimated Creatinine Clearance: 90.3 mL/min (by C-G formula based on SCr of 0.63 mg/dL).  Liver Function Tests: Recent Labs  Lab 09/30/23 2322 10/01/23 0450  AST 20 17  ALT 16 14  ALKPHOS 48 38  BILITOT 0.3 0.3  PROT 7.1 5.6*  ALBUMIN  3.7 3.0*    Recent Labs  Lab 09/30/23 2322  LIPASE 24     Recent Results (from the past 240 hours)  MRSA Next Gen by PCR, Nasal     Status: None   Collection Time: 10/01/23  7:42 PM   Specimen: Nasal Mucosa; Nasal Swab  Result Value Ref Range Status   MRSA by PCR Next Gen NOT DETECTED NOT DETECTED Final    Comment: (NOTE) The GeneXpert MRSA Assay (FDA approved for NASAL specimens only), is one component of a comprehensive MRSA colonization surveillance program. It is not intended to diagnose MRSA infection nor to guide or monitor treatment for MRSA infections. Test performance is not FDA approved in patients less than 44 years old. Performed at [facility], [address] [place], [place] [address]       Radiology Studies: No results found.      LOS: 4 days   Aayat Hajjar  [facility] Pager on [email or web address]  10/05/2023, 11:09 AM

## 2023-10-06 DIAGNOSIS — K922 Gastrointestinal hemorrhage, unspecified: Secondary | ICD-10-CM | POA: Diagnosis not present

## 2023-10-06 LAB — GLUCOSE, CAPILLARY
Glucose-Capillary: 116 mg/dL — ABNORMAL HIGH (ref 70–99)
Glucose-Capillary: 70 mg/dL (ref 70–99)
Glucose-Capillary: 76 mg/dL (ref 70–99)
Glucose-Capillary: 82 mg/dL (ref 70–99)

## 2023-10-06 LAB — BASIC METABOLIC PANEL
Anion gap: 8 (ref 5–15)
BUN: 19 mg/dL (ref 6–20)
CO2: 28 mmol/L (ref 22–32)
Calcium: 8.7 mg/dL — ABNORMAL LOW (ref 8.9–10.3)
Chloride: 102 mmol/L (ref 98–111)
Creatinine, Ser: 0.62 mg/dL (ref 0.44–1.00)
GFR, Estimated: >60 mL/min (ref >60)
Glucose, Bld: 95 mg/dL (ref 70–99)
Potassium: 4.2 mmol/L (ref 3.5–5.1)
Sodium: 138 mmol/L (ref 135–145)

## 2023-10-06 LAB — CBC
HCT: 29.7 % — ABNORMAL LOW (ref 36.0–46.0)
Hemoglobin: 9.2 g/dL — ABNORMAL LOW (ref 12.0–15.0)
MCH: 29.8 pg (ref 26.0–34.0)
MCHC: 31 g/dL (ref 30.0–36.0)
MCV: 96.1 fL (ref 80.0–100.0)
Platelets: 429 10*3/uL — ABNORMAL HIGH (ref 150–400)
RBC: 3.09 MIL/uL — ABNORMAL LOW (ref 3.87–5.11)
RDW: 14 % (ref 11.5–15.5)
WBC: 6.7 10*3/uL (ref 4.0–10.5)
nRBC: 0 % (ref 0.0–0.2)

## 2023-10-06 LAB — H. PYLORI ANTIGEN, STOOL: H. Pylori Stool Ag, Eia: NEGATIVE

## 2023-10-06 LAB — GASTRIN: Gastrin: 280 pg/mL — ABNORMAL HIGH (ref 0–115)

## 2023-10-06 LAB — PHOSPHORUS: Phosphorus: 4.6 mg/dL (ref 2.5–4.6)

## 2023-10-06 MED ORDER — WITCH HAZEL-GLYCERIN EX PADS
MEDICATED_PAD | CUTANEOUS | Status: DC | PRN
  Administered 2023-10-06: 1 via TOPICAL
  Filled 2023-10-06: qty 100

## 2023-10-06 MED ORDER — DICYCLOMINE HCL 10 MG PO CAPS
10.0000 mg | ORAL_CAPSULE | Freq: Four times a day (QID) | ORAL | Status: DC
  Administered 2023-10-06: 10 mg via ORAL
  Filled 2023-10-06: qty 1

## 2023-10-06 NOTE — Plan of Care (Signed)

## 2023-10-06 NOTE — TOC Transition Note (Signed)
 Transition of Care [facility]) - Discharge Note   Patient Details  Name: [name] MRN: [id number] Date of Birth: 1975-10-22  Transition of Care [facility]) CM/SW Contact:  [name], LCSW Phone Number: 10/06/2023, 12:41 PM   Clinical Narrative:    Pt to return home with home health services through [facility]. Pt to continue tube feeds and will continue to receive supplies though [facility]. Rollator ordered and to be delivered to pt's room today prior to discharge. Pt's son to provide transportation home at discharge. No further TOC needs identified.    Final next level of care: Home w Home Health Services Barriers to Discharge: Barriers Resolved   Patient Goals and CMS Choice Patient states their goals for this hospitalization and ongoing recovery are:: To return home CMS Medicare.gov Compare Post Acute Care list provided to:: Patient Choice offered to / list presented to : Patient [place] ownership interest in [facility].provided to::  (NA)    Discharge Placement                       Discharge Plan and Services Additional resources added to the After Visit Summary for   In-house Referral: Clinical Social Work Discharge Planning Services: NA Post Acute Care Choice: Home Health, Durable Medical Equipment                    HH Arranged: PT, RN [facility] Agency: [facility] Date [facility] Agency Contacted: 10/04/23 Time HH Agency Contacted: 1234 Representative spoke with at [facility] Agency: Cindie  Social Drivers of Health (SDOH) Interventions SDOH Screenings   Food Insecurity: Food Insecurity Present (10/01/2023)  Housing: High Risk (10/01/2023)  Transportation Needs: Unmet Transportation Needs (10/01/2023)  Utilities: At Risk (10/01/2023)  Alcohol Screen: Medium Risk (04/08/2019)  Depression (PHQ2-9): High Risk (08/25/2023)  Financial Resource Strain: Low Risk  (04/08/2019)  Physical Activity: Inactive (04/08/2019)  Social Connections: Unknown (01/29/2022)    Received from [facility], [facility]  Stress: Stress Concern Present (04/08/2019)  Tobacco Use: Medium Risk (09/30/2023)     Readmission Risk Interventions    09/15/2023   11:43 AM 07/30/2023   11:59 AM  Readmission Risk Prevention Plan  Transportation Screening Complete Complete  PCP or Specialist Appt within 5-7 Days  Complete  PCP or Specialist Appt within 3-5 Days Complete   Home Care Screening  Complete  Medication Review (RN CM)  Complete  HRI or Home Care Consult Complete   Social Work Consult for Recovery Care Planning/Counseling Complete   Palliative Care Screening Not Applicable   Medication Review [employer]) Complete

## 2023-10-08 LAB — VITAMIN K1, SERUM: VITAMIN K1: 0.19 ng/mL (ref 0.10–2.20)

## 2023-11-26 ENCOUNTER — Other Ambulatory Visit: Payer: Self-pay | Admitting: Neurology

## 2023-11-29 NOTE — Telephone Encounter (Signed)
 Rx refilled.

## 2023-12-10 ENCOUNTER — Telehealth: Payer: Self-pay | Admitting: Neurology

## 2023-12-10 NOTE — Telephone Encounter (Signed)
 Reviewed the office note from Dr. Laruth Bouchard office 12/09/2023 [name]), was reporting double vision for years.  Horizontal diplopia, could not elicit.  They did check labs for myasthenia.  Got a new prescription for glasses.  There was no IIH no nerve edema.  Follow-up in 1 year.  I have seen the patient previously for migraines.  We have done Botox.  If she needs to be evaluated for double vision with laboratory lab results from ophthalmology, will need a new referral.  I am not able to see the labs.

## 2023-12-13 NOTE — Telephone Encounter (Signed)
 Routing to referrals to double check on what original referral was placed for:  If she needs to be evaluated for double vision with laboratory lab results from ophthalmology, will need a new referral

## 2023-12-13 NOTE — Telephone Encounter (Signed)
 I believe Maralyn Sago is requesting for a new referral since the original one didn't have vision listed as one

## 2023-12-23 ENCOUNTER — Other Ambulatory Visit: Payer: Self-pay | Admitting: Neurology

## 2023-12-24 ENCOUNTER — Other Ambulatory Visit: Payer: Self-pay

## 2023-12-25 ENCOUNTER — Other Ambulatory Visit: Payer: Self-pay | Admitting: Neurology

## 2024-02-19 ENCOUNTER — Other Ambulatory Visit: Payer: Self-pay

## 2024-02-19 ENCOUNTER — Emergency Department (HOSPITAL_COMMUNITY)
Admission: EM | Admit: 2024-02-19 | Discharge: 2024-02-19 | Disposition: A | Discharge status: Home / Self Care | Attending: Emergency Medicine | Admitting: Emergency Medicine

## 2024-02-19 ENCOUNTER — Emergency Department (HOSPITAL_COMMUNITY)

## 2024-02-19 DIAGNOSIS — K9423 Gastrostomy malfunction: Secondary | ICD-10-CM | POA: Insufficient documentation

## 2024-02-19 DIAGNOSIS — T85528A Displacement of other gastrointestinal prosthetic devices, implants and grafts, initial encounter: Secondary | ICD-10-CM

## 2024-02-19 MED ORDER — SILVER NITRATE-POT NITRATE 75-25 % EX MISC
CUTANEOUS | Status: AC
  Filled 2024-02-19: qty 20

## 2024-02-19 NOTE — ED Triage Notes (Signed)
 Pt accidentally pulled feeding tube out around 3 pm, has tube with her now in a bag.

## 2024-02-19 NOTE — Discharge Instructions (Signed)
 The x-ray confirms that the gastric tube is back in your stomach. Continue to use the normally and follow-up with your doctors as you normally do.

## 2024-02-19 NOTE — ED Provider Notes (Signed)
 [place] EMERGENCY DEPARTMENT AT [facility] Provider Note   CSN: [id number] Arrival date & time: 02/19/24  1617     History  Chief Complaint  Patient presents with   Feeding tube came out    [name] is a 48 y.o. female.  HPI    48 year old female comes in with chief complaint of feeding tube falling out.  Patient has a G-tube in place because of nutritional concerns.  She has previous history of bariatric surgery.  G-tube was placed sometime end of last year.  No complications since then.  At 3 PM, the G-tube just fell out.  Patient has no other complaints or concerns.  Home Medications Prior to Admission medications   Medication Sig Start Date End Date Taking? Authorizing Provider  acetaminophen  (TYLENOL ) 500 MG tablet Take 2 tablets (1,000 mg total) by mouth every 6 (six) hours as needed. 09/16/23 09/15/24  Stechschulte, Avon Boers, MD  albuterol  (VENTOLIN  HFA) 108 (90 Base) MCG/ACT inhaler Inhale 2 puffs into the lungs every 6 (six) hours as needed for wheezing or shortness of breath.    [provider]  atorvastatin  (LIPITOR) 40 MG tablet Take 40 mg by mouth daily. 12/10/22   [provider]  botulinum toxin Type A  (BOTOX ) 200 units injection INJECT 155 UNITS INTRAMUSCULARLY INTO HEAD AND NECK MUSCLES EVERY 3 MONTHS 11/23/22   Phebe Brasil, MD  Buprenorphine  HCl (BELBUCA ) 75 MCG FILM Place 75 mcg under the tongue in the morning and at bedtime.    [provider]  buPROPion  (WELLBUTRIN  XL) 300 MG 24 hr tablet Take 300 mg by mouth every morning. Take along with 150 mg=450 mg 06/05/20   [provider]  busPIRone  (BUSPAR ) 15 MG tablet Take 15 mg by mouth 3 (three) times daily.    [provider]  Cholecalciferol (VITAMIN D3) 1.25 MG (50000 UT) CAPS Take 50,000 Units by mouth every 7 (seven) days.  04/03/19   [provider]  clonazePAM  (KLONOPIN ) 1 MG tablet Take 1 mg by mouth 2 (two) times daily as needed for  anxiety.    [provider]  cyanocobalamin  (,VITAMIN B-12,) 1000 MCG/ML injection Inject 1,000 mcg into the muscle 2 (two) times a week.  04/06/19   [provider]  diclofenac  Sodium (VOLTAREN ) 1 % GEL Apply 2 g topically 4 (four) times daily as needed (Pain).    [provider]  dicyclomine  (BENTYL ) 10 MG capsule Take 10 mg by mouth 4 (four) times daily. 12/17/20   [provider]  DULoxetine  (CYMBALTA ) 60 MG capsule Take 1 capsule (60 mg total) by mouth 2 (two) times daily. 04/10/19   Rochell Chroman, NP  EPINEPHrine  0.3 mg/0.3 mL IJ SOAJ injection Inject 0.3 mg into the muscle as needed for anaphylaxis. 04/12/21   Marshal Skeens, PA-C  famotidine  (PEPCID ) 20 MG tablet Take 20 mg by mouth 2 (two) times daily.    [provider]  ferrous sulfate  325 (65 FE) MG tablet Take 1 tablet (325 mg total) by mouth daily with breakfast. 01/29/23   Audria Leather, MD  fluticasone  (FLONASE ) 50 MCG/ACT nasal spray Place 2 sprays into both nostrils daily as needed for allergies or rhinitis.    [provider]  gabapentin  (NEURONTIN ) 600 MG tablet Take 600 mg by mouth 3 (three) times daily.    [provider]  hydrocortisone  (ANUSOL -HC) 2.5 % rectal cream Place 1 Application rectally daily as needed for hemorrhoids. 07/29/18   [provider]  HYDROmorphone  (DILAUDID ) 8 MG tablet Take 0.5 tablets (4 mg total) by mouth every 3 (three) hours. 03/08/23     linaclotide  (LINZESS ) 290 MCG CAPS capsule Take 290 mcg by mouth daily before breakfast.    [provider]  magnesium  oxide (MAG-OX) 400 (240 Mg) MG tablet Take 400 mg by mouth daily. 12/19/22   [provider]  meclizine  (ANTIVERT ) 25 MG tablet Take 25 mg by mouth 3 (three) times daily. 04/15/22   [provider]  mesalamine  (LIALDA ) 1.2 g EC tablet Take 2.4 g by mouth daily with breakfast.    [provider]  methocarbamol  (ROBAXIN -750) 750 MG tablet Take 1  tablet (750 mg total) by mouth every 6 (six) hours as needed for muscle spasms. 09/16/23   Stechschulte, Avon Boers, MD  midodrine  (PROAMATINE ) 5 MG tablet Take 15 mg by mouth 3 (three) times daily with meals.    [provider]  Multiple Vitamin (MULTIVITAMIN WITH MINERALS) TABS tablet Take 1 tablet by mouth daily.    [provider]  NARCAN  4 MG/0.1ML LIQD nasal spray kit Place 1 spray into the nose daily as needed (overdose). 03/18/20   [provider]  omeprazole  (KONVOMEP) 2 mg/mL SUSP oral suspension Take 10 mLs (20 mg total) by mouth 2 (two) times daily before a meal. 10/05/23 11/04/23  Maylene Spear, MD  ondansetron  (ZOFRAN ) 8 MG tablet Take 8 mg by mouth every 8 (eight) hours as needed for nausea or vomiting.    [provider]  oxybutynin  (DITROPAN ) 5 MG tablet Take 10 mg by mouth at bedtime.    [provider]  QULIPTA  30 MG TABS TAKE 1 TABLET BY MOUTH EVERY DAY 12/27/23   Wess Hammed, NP  sucralfate  (CARAFATE ) 1 g tablet Take 1 tablet (1 g total) by mouth every 6 (six) hours. Patient taking differently: Take 1 g by mouth 4 (four) times daily. 01/21/23   Amin, Sumayya, MD  SUMAtriptan  (IMITREX ) 50 MG tablet TAKE 1 TABLET BY MOUTH EVERY 2 HOURS AS NEEDED FOR MIGRAINE, MAY REPEAT IN 2 HOURS HEADACHE PERSISTS OR RECURS 11/29/23   Wess Hammed, NP  SYMPROIC  0.2 MG TABS Take 0.2 mg by mouth daily. 05/13/21   [provider]  thiamine  (VITAMIN B-1) 100 MG tablet Take 100 mg by mouth daily at 12 noon.     [provider]  torsemide  (DEMADEX ) 5 MG tablet Take 5 mg by mouth daily. 11/10/21   [provider]  valACYclovir  (VALTREX ) 1000 MG tablet Take 1,000 mg by mouth 2 (two) times daily as needed.    [provider]      Allergies    Ciprofloxacin , Coconut (cocos nucifera), Coconut oil, Morphine  and codeine, Oxycodone, Oxycodone-acetaminophen , Doxycycline, Penicillamine, Trazodone  and nefazodone, Vistaril  [hydroxyzine  hcl],  Silicone, Penicillins, and Tape    Review of Systems   Review of Systems  All other systems reviewed and are negative.   Physical Exam Updated Vital Signs BP 126/84   Pulse 88   Temp 99 F (37.2 C) (Oral)   Resp 18   LMP 06/02/2017 Comment: hysterectomy 06/08/2017  SpO2 100%  Physical Exam Vitals and nursing note reviewed.  Constitutional:      Appearance: She is well-developed.  HENT:     Head: Atraumatic.  Cardiovascular:     Rate and Rhythm: Normal rate.  Pulmonary:     Effort: Pulmonary effort is normal.  Abdominal:     Comments: Stoma with biliary drainage noted in the abdomen at  the presumed G-tube site  Musculoskeletal:     Cervical back: Normal range of motion and neck supple.  Skin:    General: Skin is warm and dry.  Neurological:     Mental Status: She is alert and oriented to person, place, and time.     ED Results / Procedures / Treatments   Labs (all labs ordered are listed, but only abnormal results are displayed) Labs Reviewed - No data to display  EKG None  Radiology DG Abdomen 1 View Result Date: 02/19/2024 CLINICAL DATA:  G-tube placement. EXAM: ABDOMEN - 1 VIEW COMPARISON:  10/01/2023. FINDINGS: Surgical changes are noted in the stomach, compatible with known gastric bypass. A percutaneous tube terminates in the presumed region of the bypassed gastric lumen as compared with prior CT. No definite evidence of contrast extravasation. There is a nonobstructive bowel gas pattern. No radio-opaque calculi. Neurostimulator device is seen over the sacrum on the left. IMPRESSION: Status post gastric bypass with tip of the gastrotomy tube presumably in the region of the bypassed gastric lumen. No contrast extravasation is seen. Electronically Signed   By: Wyvonnia Heimlich M.D.   On: 02/19/2024 19:41    Procedures .Gastrostomy tube replacement  Date/Time: 02/19/2024 8:16 PM  Performed by: Deatra Face, MD Authorized by: Deatra Face, MD  Consent: Verbal  consent obtained. Risks and benefits: risks, benefits and alternatives were discussed Consent given by: patient Patient understanding: patient states understanding of the procedure being performed Imaging studies: imaging studies available Required items: required blood products, implants, devices, and special equipment available Patient identity confirmed: arm band Time out: Immediately prior to procedure a "time out" was called to verify the correct patient, procedure, equipment, support staff and site/side marked as required. Preparation: Patient was prepped and draped in the usual sterile fashion. Local anesthesia used: no  Anesthesia: Local anesthesia used: no  Sedation: Patient sedated: no  Patient tolerance: patient tolerated the procedure well with no immediate complications   Cauterization  Date/Time: 02/19/2024 8:16 PM  Performed by: Deatra Face, MD Authorized by: Deatra Face, MD  Consent: Verbal consent obtained. Risks and benefits: risks, benefits and alternatives were discussed Consent given by: patient Imaging studies: imaging studies available Patient identity confirmed: arm band Time out: Immediately prior to procedure a "time out" was called to verify the correct patient, procedure, equipment, support staff and site/side marked as required. Preparation: Patient was prepped and draped in the usual sterile fashion. Local anesthesia used: no  Anesthesia: Local anesthesia used: no  Sedation: Patient sedated: no  Patient tolerance: patient tolerated the procedure well with no immediate complications Comments: Silver nitrate cauterization was used to cauterize minor bleeding around the insertion of the G-tube and also removal of debris       Medications Ordered in ED Medications  silver nitrate applicators 75-25 % applicator (has no administration in time range)    ED Course/ Medical Decision Making/ A&P Clinical Course as of 02/19/24 2030  Sat  Feb 19, 2024  2013 DG Abdomen 1 View [AN]    Clinical Course User Index [AN] Deatra Face, MD                                 Medical Decision Making Amount and/or Complexity of Data Reviewed Radiology: ordered. Decision-making details documented in ED Course.  48 year old female comes in with chief complaint of G-tube dislodgment.  I reviewed patient's records including general surgery notes that  describe G-tube placement.  G-tube was placed.  Follow-up x-ray was ordered and independently interpreted by me.  The x-ray does not show any contrast extravasation, but the G-tube appears to be in a different site than expected.  I called the radiologist and spoke with Dr. Carolynne Citron, who confirms that most likely G-tube is in place, that she looked at previous CT scan to confirm the location.  She does not see any contracted extravasation.  Patient states that normally the tissue around her stoma is cauterized.  There was some bleeding from the 20 [place] G-tube placement, therefore we cauterized the bleed and also cleared up some of the debris from around the stoma.  Patient stable for discharge.   Final Clinical Impression(s) / ED Diagnoses Final diagnoses:  Dislodged gastrostomy tube    Rx / DC Orders ED Discharge Orders     None         Deatra Face, MD 02/19/24 2031

## 2024-02-22 NOTE — Telephone Encounter (Signed)
 Pt called in requesting Botox  appt, has not been seen since August. Per last note, she was hospitalized at the time of her last appt and had not reached back out to reschedule. Verified that she still has Medicare/[employer] and scheduled with [name] for 03/16/24 @ 11:15 am.

## 2024-02-22 NOTE — Telephone Encounter (Signed)
 I received MG and Musk AB testing which were all negative.

## 2024-03-12 ENCOUNTER — Emergency Department (HOSPITAL_COMMUNITY)

## 2024-03-12 ENCOUNTER — Other Ambulatory Visit: Payer: Self-pay

## 2024-03-12 ENCOUNTER — Encounter (HOSPITAL_COMMUNITY): Payer: Self-pay

## 2024-03-12 ENCOUNTER — Emergency Department (HOSPITAL_COMMUNITY)
Admission: EM | Admit: 2024-03-12 | Discharge: 2024-03-12 | Disposition: A | Discharge status: Home / Self Care | Attending: Emergency Medicine | Admitting: Emergency Medicine

## 2024-03-12 DIAGNOSIS — Y92009 Unspecified place in unspecified non-institutional (private) residence as the place of occurrence of the external cause: Secondary | ICD-10-CM | POA: Diagnosis not present

## 2024-03-12 DIAGNOSIS — M25512 Pain in left shoulder: Secondary | ICD-10-CM | POA: Diagnosis not present

## 2024-03-12 DIAGNOSIS — M25551 Pain in right hip: Secondary | ICD-10-CM | POA: Diagnosis not present

## 2024-03-12 DIAGNOSIS — W19XXXA Unspecified fall, initial encounter: Secondary | ICD-10-CM | POA: Diagnosis not present

## 2024-03-12 DIAGNOSIS — R55 Syncope and collapse: Secondary | ICD-10-CM

## 2024-03-12 DIAGNOSIS — S00212A Abrasion of left eyelid and periocular area, initial encounter: Secondary | ICD-10-CM | POA: Insufficient documentation

## 2024-03-12 DIAGNOSIS — Z720 Tobacco use: Secondary | ICD-10-CM | POA: Insufficient documentation

## 2024-03-12 DIAGNOSIS — M545 Low back pain, unspecified: Secondary | ICD-10-CM | POA: Insufficient documentation

## 2024-03-12 DIAGNOSIS — M25552 Pain in left hip: Secondary | ICD-10-CM | POA: Diagnosis not present

## 2024-03-12 DIAGNOSIS — M542 Cervicalgia: Secondary | ICD-10-CM | POA: Diagnosis not present

## 2024-03-12 LAB — CBC WITH DIFFERENTIAL/PLATELET
Abs Immature Granulocytes: 0.01 10*3/uL (ref 0.00–0.07)
Basophils Absolute: 0.1 10*3/uL (ref 0.0–0.1)
Basophils Relative: 1 %
Eosinophils Absolute: 0.4 10*3/uL (ref 0.0–0.5)
Eosinophils Relative: 6 %
HCT: 33.1 % — ABNORMAL LOW (ref 36.0–46.0)
Hemoglobin: 9.6 g/dL — ABNORMAL LOW (ref 12.0–15.0)
Immature Granulocytes: 0 %
Lymphocytes Relative: 47 %
Lymphs Abs: 3.3 10*3/uL (ref 0.7–4.0)
MCH: 25.1 pg — ABNORMAL LOW (ref 26.0–34.0)
MCHC: 29 g/dL — ABNORMAL LOW (ref 30.0–36.0)
MCV: 86.6 fL (ref 80.0–100.0)
Monocytes Absolute: 0.4 10*3/uL (ref 0.1–1.0)
Monocytes Relative: 6 %
Neutro Abs: 2.8 10*3/uL (ref 1.7–7.7)
Neutrophils Relative %: 40 %
Platelets: 352 10*3/uL (ref 150–400)
RBC: 3.82 MIL/uL — ABNORMAL LOW (ref 3.87–5.11)
RDW: 17.2 % — ABNORMAL HIGH (ref 11.5–15.5)
WBC: 7 10*3/uL (ref 4.0–10.5)
nRBC: 0 % (ref 0.0–0.2)

## 2024-03-12 LAB — COMPREHENSIVE METABOLIC PANEL WITH GFR
ALT: 18 U/L (ref 0–44)
AST: 19 U/L (ref 15–41)
Albumin: 4 g/dL (ref 3.5–5.0)
Alkaline Phosphatase: 65 U/L (ref 38–126)
Anion gap: 8 (ref 5–15)
BUN: 14 mg/dL (ref 6–20)
CO2: 27 mmol/L (ref 22–32)
Calcium: 9.2 mg/dL (ref 8.9–10.3)
Chloride: 101 mmol/L (ref 98–111)
Creatinine, Ser: 0.9 mg/dL (ref 0.44–1.00)
GFR, Estimated: >60 mL/min (ref >60)
Glucose, Bld: 82 mg/dL (ref 70–99)
Potassium: 3.7 mmol/L (ref 3.5–5.1)
Sodium: 136 mmol/L (ref 135–145)
Total Bilirubin: 0.6 mg/dL (ref 0.0–1.2)
Total Protein: 7.4 g/dL (ref 6.5–8.1)

## 2024-03-12 LAB — TYPE AND SCREEN
ABO/RH(D): O POS
Antibody Screen: NEGATIVE

## 2024-03-12 LAB — VITAMIN B12: Vitamin B-12: 975 pg/mL — ABNORMAL HIGH (ref 180–914)

## 2024-03-12 LAB — IRON AND TIBC
Iron: 20 ug/dL — ABNORMAL LOW (ref 28–170)
Saturation Ratios: 5 % — ABNORMAL LOW (ref 10.4–31.8)
TIBC: 449 ug/dL (ref 250–450)
UIBC: 429 ug/dL

## 2024-03-12 LAB — RETICULOCYTES
Immature Retic Fract: 22.3 % — ABNORMAL HIGH (ref 2.3–15.9)
RBC.: 3.75 MIL/uL — ABNORMAL LOW (ref 3.87–5.11)
Retic Count, Absolute: 27.4 10*3/uL (ref 19.0–186.0)
Retic Ct Pct: 0.7 % (ref 0.4–3.1)

## 2024-03-12 LAB — FERRITIN: Ferritin: 6 ng/mL — ABNORMAL LOW (ref 11–307)

## 2024-03-12 LAB — CBG MONITORING, ED: Glucose-Capillary: 75 mg/dL (ref 70–99)

## 2024-03-12 LAB — FOLATE: Folate: 21.2 ng/mL (ref >5.9)

## 2024-03-12 MED ORDER — SODIUM CHLORIDE 0.9 % IV BOLUS
1000.0000 mL | Freq: Once | INTRAVENOUS | Status: AC
  Administered 2024-03-12: 1000 mL via INTRAVENOUS

## 2024-03-12 MED ORDER — HYDROCODONE-ACETAMINOPHEN 5-325 MG PO TABS
1.0000 | ORAL_TABLET | Freq: Once | ORAL | Status: AC
  Administered 2024-03-12: 1 via ORAL
  Filled 2024-03-12: qty 1

## 2024-03-12 MED ORDER — LIDOCAINE 5 % EX PTCH
1.0000 | MEDICATED_PATCH | CUTANEOUS | Status: DC
  Administered 2024-03-12: 1 via TRANSDERMAL
  Filled 2024-03-12: qty 1

## 2024-03-12 MED ORDER — ONDANSETRON 4 MG PO TBDP: 4.0000 mg | ORAL_TABLET | Freq: Three times a day (TID) | ORAL | 0 refills | Status: AC | PRN

## 2024-03-12 NOTE — ED Triage Notes (Signed)
 Patient said she blacked out at 9:30am for a couple of minutes onto cement floor. Felt her whole body feel shaky before she blacked out and she felt like she was under water . Fell onto left side of her face. Has a headache and jaw pain. No blood thinners. Stated her hemoglobin might be low. Has POTS.

## 2024-03-12 NOTE — Discharge Instructions (Addendum)
 As discussed, you labs and imaging are reassuring.  You could have a concussion. Take tylenol  500 mg every 6-8 hours for headache.I have sent a prescription of Zofran  to the pharmacy, you can take this every 8 hours as needed for nausea/vomiting.  You can get lidocaine  patches over-the-counter to help with the neck discomfort.  Follow-up with your cardiologist for reevaluation of your breath.  This could be causing you to pass out.  Follow-up with your primary care provider in the next 3 to 5 days for reevaluation of your symptoms.  Get help right away if: You have very bad headaches or your headaches get worse. You have any of these problems: Feeling weak or numb in any part of your body. Slurred speech. Changes in how you see (vision). Feeling mixed up (confused). You vomit often. You faint or other people have trouble waking you up. You have a seizure.

## 2024-03-12 NOTE — ED Notes (Signed)
 Pt transported to CT ?

## 2024-03-12 NOTE — ED Notes (Signed)
 Brother Arnetta Lank can pick her up [phone]

## 2024-03-12 NOTE — ED Provider Notes (Signed)
 [place] EMERGENCY DEPARTMENT AT [facility] Provider Note   CSN: [id number] Arrival date & time: 03/12/24  1007     Patient presents with: Loss of Consciousness   [name] is a 48 y.o. female with a history of ulcerative colitis, iron deficiency anemia, POTS, and tobacco use presents the ED today after syncopal event.  Patient reports that she was at [facility] when she passed out.  States that she was out for several minutes and was reported to have some right leg twitching.  Endorses history of passing out in the past.  Reports left cheek, headache, pain to the back of her left shoulder, pain to her hips and low back which is worse than her baseline.  Said that she had an additional fall at home several days ago as well.  Denies any blood thinner use.  No vision changes, dizziness, or confusion.    Prior to Admission medications   Medication Sig Start Date End Date Taking? Authorizing Provider  ondansetron  (ZOFRAN -ODT) 4 MG disintegrating tablet Take 1 tablet (4 mg total) by mouth every 8 (eight) hours as needed. 03/12/24 04/11/24 Yes Waddell Sluder, PA-C  acetaminophen  (TYLENOL ) 500 MG tablet Take 2 tablets (1,000 mg total) by mouth every 6 (six) hours as needed. 09/16/23 09/15/24  Stechschulte, Deward PARAS, MD  albuterol  (VENTOLIN  HFA) 108 [phone] Base) MCG/ACT inhaler Inhale 2 puffs into the lungs every 6 (six) hours as needed for wheezing or shortness of breath.    [provider]  atorvastatin  (LIPITOR) 40 MG tablet Take 40 mg by mouth daily. 12/10/22   [provider]  botulinum toxin Type A  (BOTOX ) 200 units injection INJECT 155 UNITS INTRAMUSCULARLY INTO HEAD AND NECK MUSCLES EVERY 3 MONTHS 11/23/22   Onita Duos, MD  Buprenorphine  HCl (BELBUCA ) 75 MCG FILM Place 75 mcg under the tongue in the morning and at bedtime.    [provider]  buPROPion  (WELLBUTRIN  XL) 300 MG 24 hr tablet Take 300 mg by mouth every morning. Take along with 150 mg=450 mg  06/05/20   [provider]  busPIRone  (BUSPAR ) 15 MG tablet Take 15 mg by mouth 3 (three) times daily.    [provider]  Cholecalciferol (VITAMIN D3) 1.25 MG (50000 UT) CAPS Take 50,000 Units by mouth every 7 (seven) days.  04/03/19   [provider]  clonazePAM  (KLONOPIN ) 1 MG tablet Take 1 mg by mouth 2 (two) times daily as needed for anxiety.    [provider]  cyanocobalamin  (,VITAMIN B-12,) 1000 MCG/ML injection Inject 1,000 mcg into the muscle 2 (two) times a week.  04/06/19   [provider]  diclofenac  Sodium (VOLTAREN ) 1 % GEL Apply 2 g topically 4 (four) times daily as needed (Pain).    [provider]  dicyclomine  (BENTYL ) 10 MG capsule Take 10 mg by mouth 4 (four) times daily. 12/17/20   [provider]  DULoxetine  (CYMBALTA ) 60 MG capsule Take 1 capsule (60 mg total) by mouth 2 (two) times daily. 04/10/19   Wonda Clarita BRAVO, NP  EPINEPHrine  0.3 mg/0.3 mL IJ SOAJ injection Inject 0.3 mg into the muscle as needed for anaphylaxis. 04/12/21   Eudelia Maude SAUNDERS, PA-C  famotidine  (PEPCID ) 20 MG tablet Take 20 mg by mouth 2 (two) times daily.    [provider]  ferrous sulfate  325 (65 FE) MG tablet Take 1 tablet (325 mg total) by mouth daily with breakfast. 01/29/23   Cheryle Page, MD  fluticasone  (FLONASE ) 50  MCG/ACT nasal spray Place 2 sprays into both nostrils daily as needed for allergies or rhinitis.    [provider]  gabapentin  (NEURONTIN ) 600 MG tablet Take 600 mg by mouth 3 (three) times daily.    [provider]  hydrocortisone  (ANUSOL -HC) 2.5 % rectal cream Place 1 Application rectally daily as needed for hemorrhoids. 07/29/18   [provider]  HYDROmorphone  (DILAUDID ) 8 MG tablet Take 0.5 tablets (4 mg total) by mouth every 3 (three) hours. 03/08/23     linaclotide  (LINZESS ) 290 MCG CAPS capsule Take 290 mcg by mouth daily before breakfast.    [provider]  magnesium  oxide  (MAG-OX) 400 (240 Mg) MG tablet Take 400 mg by mouth daily. 12/19/22   [provider]  meclizine  (ANTIVERT ) 25 MG tablet Take 25 mg by mouth 3 (three) times daily. 04/15/22   [provider]  mesalamine  (LIALDA ) 1.2 g EC tablet Take 2.4 g by mouth daily with breakfast.    [provider]  methocarbamol  (ROBAXIN -750) 750 MG tablet Take 1 tablet (750 mg total) by mouth every 6 (six) hours as needed for muscle spasms. 09/16/23   Stechschulte, Deward PARAS, MD  midodrine  (PROAMATINE ) 5 MG tablet Take 15 mg by mouth 3 (three) times daily with meals.    [provider]  Multiple Vitamin (MULTIVITAMIN WITH MINERALS) TABS tablet Take 1 tablet by mouth daily.    [provider]  NARCAN  4 MG/0.1ML LIQD nasal spray kit Place 1 spray into the nose daily as needed (overdose). 03/18/20   [provider]  omeprazole  (KONVOMEP) 2 mg/mL SUSP oral suspension Take 10 mLs (20 mg total) by mouth 2 (two) times daily before a meal. 10/05/23 11/04/23  Verdene Purchase, MD  ondansetron  (ZOFRAN ) 8 MG tablet Take 8 mg by mouth every 8 (eight) hours as needed for nausea or vomiting.    [provider]  oxybutynin  (DITROPAN ) 5 MG tablet Take 10 mg by mouth at bedtime.    [provider]  QULIPTA  30 MG TABS TAKE 1 TABLET BY MOUTH EVERY DAY 12/27/23   Gayland Lauraine PARAS, NP  sucralfate  (CARAFATE ) 1 g tablet Take 1 tablet (1 g total) by mouth every 6 (six) hours. Patient taking differently: Take 1 g by mouth 4 (four) times daily. 01/21/23   Amin, Sumayya, MD  SUMAtriptan  (IMITREX ) 50 MG tablet TAKE 1 TABLET BY MOUTH EVERY 2 HOURS AS NEEDED FOR MIGRAINE, MAY REPEAT IN 2 HOURS HEADACHE PERSISTS OR RECURS 11/29/23   Gayland Lauraine PARAS, NP  SYMPROIC  0.2 MG TABS Take 0.2 mg by mouth daily. 05/13/21   [provider]  thiamine  (VITAMIN B-1) 100 MG tablet Take 100 mg by mouth daily at 12 noon.     [provider]  torsemide  (DEMADEX ) 5 MG tablet Take 5 mg by mouth daily.  11/10/21   [provider]  valACYclovir  (VALTREX ) 1000 MG tablet Take 1,000 mg by mouth 2 (two) times daily as needed.    [provider]    Allergies: Ciprofloxacin , Coconut (cocos nucifera), Coconut oil, Morphine  and codeine, Oxycodone, Oxycodone-acetaminophen , Doxycycline, Penicillamine, Trazodone  and nefazodone, Vistaril  [hydroxyzine  hcl], Silicone, Penicillins, and Tape    Review of Systems  Neurological:  Positive for syncope.  All other systems reviewed and are negative.   Updated Vital Signs BP 128/86   Pulse 61   Temp 98.6 F (37 C) (Oral)   Resp 18   Ht 5' 7 (1.702 m)   Wt 62.1 kg   LMP  06/02/2017 Comment: hysterectomy 06/08/2017  SpO2 100%   BMI 21.46 kg/m   Physical Exam Vitals and nursing note reviewed.  Constitutional:      General: She is not in acute distress.    Appearance: Normal appearance.  HENT:     Head: Normocephalic.     Comments: 1 cm abrasion at left distal eyebrow without active bleeding. Tenderness to palpation of the left cheek without swelling or bruising.    Mouth/Throat:     Mouth: Mucous membranes are moist.   Eyes:     Conjunctiva/sclera: Conjunctivae normal.     Pupils: Pupils are equal, round, and reactive to light.   Neck:     Comments: Tenderness to right trapezius muscle without midline tenderness of the cervical spine Cardiovascular:     Rate and Rhythm: Normal rate and regular rhythm.     Pulses: Normal pulses.     Heart sounds: Normal heart sounds.  Pulmonary:     Effort: Pulmonary effort is normal.     Breath sounds: Normal breath sounds.  Abdominal:     Palpations: Abdomen is soft.     Tenderness: There is no abdominal tenderness.   Musculoskeletal:        General: Tenderness present. Normal range of motion.     Cervical back: Tenderness present.     Right lower leg: No edema.     Left lower leg: No edema.     Comments: Tenderness to palpation of posterior left shoulder, bilateral posterior hips,  and lumbar spine. ROM, strength, and sensation of upper and lower extremities intact bilaterally.   Skin:    General: Skin is warm and dry.     Findings: No rash.   Neurological:     General: No focal deficit present.     Mental Status: She is alert.   Psychiatric:        Mood and Affect: Mood normal.        Behavior: Behavior normal.    Orthostatic Vital Signs  Orthostatic Lying BP- Lying: 162/114 Abnormal  Pulse- Lying: 65 Orthostatic Sitting BP- Sitting: 144/108 Abnormal  Pulse- Sitting: 72 Orthostatic Standing at 0 minutes BP- Standing at 0 minutes: 130/90 Pulse- Standing at 0 minutes: 74   (all labs ordered are listed, but only abnormal results are displayed) Labs Reviewed  CBC WITH DIFFERENTIAL/PLATELET - Abnormal; Notable for the following components:      Result Value   RBC 3.82 (*)    Hemoglobin 9.6 (*)    HCT 33.1 (*)    MCH 25.1 (*)    MCHC 29.0 (*)    RDW 17.2 (*)    All other components within normal limits  VITAMIN B12 - Abnormal; Notable for the following components:   Vitamin B-12 975 (*)    All other components within normal limits  IRON AND TIBC - Abnormal; Notable for the following components:   Iron 20 (*)    Saturation Ratios 5 (*)    All other components within normal limits  FERRITIN - Abnormal; Notable for the following components:   Ferritin 6 (*)    All other components within normal limits  RETICULOCYTES - Abnormal; Notable for the following components:   RBC. 3.75 (*)    Immature Retic Fract 22.3 (*)    All other components within normal limits  FOLATE  COMPREHENSIVE METABOLIC PANEL WITH GFR  CBG MONITORING, ED  TYPE AND SCREEN    EKG: EKG Interpretation Date/Time:  Sunday March 12 2024 11:17:23 EDT  Ventricular Rate:  77 PR Interval:  150 QRS Duration:  78 QT Interval:  397 QTC Calculation: 450 R Axis:   50  Text Interpretation: Sinus rhythm Confirmed by Laurice Coy [phone]) on 03/12/2024 11:49:03 AM  Radiology: CT  Lumbar Spine Wo Contrast Result Date: 03/12/2024 CLINICAL DATA:  Syncope.  Fall. EXAM: CT LUMBAR SPINE WITHOUT CONTRAST TECHNIQUE: Multidetector CT imaging of the lumbar spine was performed without intravenous contrast administration. Multiplanar CT image reconstructions were also generated. RADIATION DOSE REDUCTION: This exam was performed according to the departmental dose-optimization program which includes automated exposure control, adjustment of the mA and/or kV according to patient size and/or use of iterative reconstruction technique. COMPARISON:  CT abdomen and pelvis 10/01/2023 FINDINGS: Segmentation: 5 lumbar type vertebrae. Alignment: Exaggerated lower lumbar lordosis. No significant listhesis. Vertebrae: No acute fracture or suspicious lesion. Chronic bilateral L5 pars defects. Paraspinal and other soft tissues: Sacral neural stimulator. Disc levels: Moderate right and mild left facet arthrosis at L4-5 and minor facet arthrosis more superiorly. Preserved disc space heights. Mild lower lumbar disc bulging without evidence of significant spinal stenosis or compressive neural foraminal stenosis. IMPRESSION: 1. No acute osseous abnormality. 2. Chronic L5 pars defects. Electronically Signed   By: Dasie Hamburg M.D.   On: 03/12/2024 11:33   CT Head Wo Contrast Result Date: 03/12/2024 CLINICAL DATA:  Syncope/presyncope, cerebrovascular cause suspected; fall. Headache and jaw pain. EXAM: CT HEAD WITHOUT CONTRAST CT MAXILLOFACIAL WITHOUT CONTRAST CT CERVICAL SPINE WITHOUT CONTRAST TECHNIQUE: Multidetector CT imaging of the head, cervical spine, and maxillofacial structures were performed using the standard protocol without intravenous contrast. Multiplanar CT image reconstructions of the cervical spine and maxillofacial structures were also generated. RADIATION DOSE REDUCTION: This exam was performed according to the departmental dose-optimization program which includes automated exposure control, adjustment  of the mA and/or kV according to patient size and/or use of iterative reconstruction technique. COMPARISON:  CT head and cervical spine 10/01/2023. CT maxillofacial 02/01/2023. FINDINGS: CT HEAD FINDINGS Brain: There is no evidence of an acute infarct, intracranial hemorrhage, midline shift, or extra-axial fluid collection. Cerebral volume is normal. The ventricles are normal in size. A rounded 9 mm calcified lesion over the high posterior left frontal convexity is unchanged and likely reflects a small meningioma without associated mass effect or brain edema. Vascular: No hyperdense vessel. Skull: No acute fracture or suspicious lesion. Other: None. CT MAXILLOFACIAL FINDINGS Osseous: No acute fracture, mandibular dislocation, or destructive process. Orbits: Unremarkable. Sinuses: Small mucous retention cyst in the left maxillary sinus. No sinus fluid. Clear mastoid air cells and middle ear cavities. Soft tissues: Unremarkable. CT CERVICAL SPINE FINDINGS Alignment: Straightening of the normal cervical lordosis without significant listhesis. Skull base and vertebrae: No acute fracture or suspicious lesion. Soft tissues and spinal canal: No prevertebral fluid or swelling. No visible canal hematoma. Disc levels: Mild endplate spurring at C5-6 and C6-7. Focally severe facet arthrosis at C4-5 on the left, likely with partial ankylosis. Upper chest: Unremarkable. Other: None. IMPRESSION: 1. No evidence of acute intracranial abnormality. 2. No acute facial or cervical spine fracture. Electronically Signed   By: Dasie Hamburg M.D.   On: 03/12/2024 11:26   CT Maxillofacial Wo Contrast Result Date: 03/12/2024 CLINICAL DATA:  Syncope/presyncope, cerebrovascular cause suspected; fall. Headache and jaw pain. EXAM: CT HEAD WITHOUT CONTRAST CT MAXILLOFACIAL WITHOUT CONTRAST CT CERVICAL SPINE WITHOUT CONTRAST TECHNIQUE: Multidetector CT imaging of the head, cervical spine, and maxillofacial structures were performed using the  standard protocol without intravenous contrast. Multiplanar CT image  reconstructions of the cervical spine and maxillofacial structures were also generated. RADIATION DOSE REDUCTION: This exam was performed according to the departmental dose-optimization program which includes automated exposure control, adjustment of the mA and/or kV according to patient size and/or use of iterative reconstruction technique. COMPARISON:  CT head and cervical spine 10/01/2023. CT maxillofacial 02/01/2023. FINDINGS: CT HEAD FINDINGS Brain: There is no evidence of an acute infarct, intracranial hemorrhage, midline shift, or extra-axial fluid collection. Cerebral volume is normal. The ventricles are normal in size. A rounded 9 mm calcified lesion over the high posterior left frontal convexity is unchanged and likely reflects a small meningioma without associated mass effect or brain edema. Vascular: No hyperdense vessel. Skull: No acute fracture or suspicious lesion. Other: None. CT MAXILLOFACIAL FINDINGS Osseous: No acute fracture, mandibular dislocation, or destructive process. Orbits: Unremarkable. Sinuses: Small mucous retention cyst in the left maxillary sinus. No sinus fluid. Clear mastoid air cells and middle ear cavities. Soft tissues: Unremarkable. CT CERVICAL SPINE FINDINGS Alignment: Straightening of the normal cervical lordosis without significant listhesis. Skull base and vertebrae: No acute fracture or suspicious lesion. Soft tissues and spinal canal: No prevertebral fluid or swelling. No visible canal hematoma. Disc levels: Mild endplate spurring at C5-6 and C6-7. Focally severe facet arthrosis at C4-5 on the left, likely with partial ankylosis. Upper chest: Unremarkable. Other: None. IMPRESSION: 1. No evidence of acute intracranial abnormality. 2. No acute facial or cervical spine fracture. Electronically Signed   By: Dasie Hamburg M.D.   On: 03/12/2024 11:26   CT Cervical Spine Wo Contrast Result Date:  03/12/2024 CLINICAL DATA:  Syncope/presyncope, cerebrovascular cause suspected; fall. Headache and jaw pain. EXAM: CT HEAD WITHOUT CONTRAST CT MAXILLOFACIAL WITHOUT CONTRAST CT CERVICAL SPINE WITHOUT CONTRAST TECHNIQUE: Multidetector CT imaging of the head, cervical spine, and maxillofacial structures were performed using the standard protocol without intravenous contrast. Multiplanar CT image reconstructions of the cervical spine and maxillofacial structures were also generated. RADIATION DOSE REDUCTION: This exam was performed according to the departmental dose-optimization program which includes automated exposure control, adjustment of the mA and/or kV according to patient size and/or use of iterative reconstruction technique. COMPARISON:  CT head and cervical spine 10/01/2023. CT maxillofacial 02/01/2023. FINDINGS: CT HEAD FINDINGS Brain: There is no evidence of an acute infarct, intracranial hemorrhage, midline shift, or extra-axial fluid collection. Cerebral volume is normal. The ventricles are normal in size. A rounded 9 mm calcified lesion over the high posterior left frontal convexity is unchanged and likely reflects a small meningioma without associated mass effect or brain edema. Vascular: No hyperdense vessel. Skull: No acute fracture or suspicious lesion. Other: None. CT MAXILLOFACIAL FINDINGS Osseous: No acute fracture, mandibular dislocation, or destructive process. Orbits: Unremarkable. Sinuses: Small mucous retention cyst in the left maxillary sinus. No sinus fluid. Clear mastoid air cells and middle ear cavities. Soft tissues: Unremarkable. CT CERVICAL SPINE FINDINGS Alignment: Straightening of the normal cervical lordosis without significant listhesis. Skull base and vertebrae: No acute fracture or suspicious lesion. Soft tissues and spinal canal: No prevertebral fluid or swelling. No visible canal hematoma. Disc levels: Mild endplate spurring at C5-6 and C6-7. Focally severe facet arthrosis at  C4-5 on the left, likely with partial ankylosis. Upper chest: Unremarkable. Other: None. IMPRESSION: 1. No evidence of acute intracranial abnormality. 2. No acute facial or cervical spine fracture. Electronically Signed   By: Dasie Hamburg M.D.   On: 03/12/2024 11:26   DG Hips Bilat W or Wo Pelvis 2 Views Result Date: 03/12/2024 CLINICAL DATA:  Fall. EXAM: DG HIP (WITH OR WITHOUT PELVIS) 5V BILAT COMPARISON:  None Available. FINDINGS: There is no evidence of hip fracture or dislocation. Pelvic ring is intact. There is no evidence of arthropathy or other focal bone abnormality. Presacral electronic stimulation device noted. IMPRESSION: Negative. Electronically Signed   By: Fonda Field M.D.   On: 03/12/2024 11:03   DG Shoulder Left Result Date: 03/12/2024 EXAM: 1 VIEW XRAY OF THE LEFT SHOULDER 03/12/2024 10:57:20 AM COMPARISON: 2-view chest x-ray. CLINICAL HISTORY: Fall. Patient said she blacked out at 9:30am for a couple of minutes onto cement floor. Felt her whole body feel shaky before she blacked out and she felt like she was under water . Fell onto left side of her face. Pt complained of left posterior shoulder pain and bilateral hip pain. FINDINGS: BONES AND JOINTS: Glenohumeral joint is normally aligned. No acute fracture or dislocation. The [facility] joint is unremarkable in appearance. SOFT TISSUES: No abnormal calcifications. Visualized lung is unremarkable. IMPRESSION: 1. No acute fracture or dislocation. Electronically signed by: Lonni Necessary MD 03/12/2024 11:03 AM EDT RP Workstation: [id number]     Procedures   Medications Ordered in the ED  lidocaine  (LIDODERM ) 5 % 1 patch (1 patch Transdermal Patch Applied 03/12/24 1356)  sodium chloride  0.9 % bolus 1,000 mL (1,000 mLs Intravenous New Bag/Given 03/12/24 1238)  HYDROcodone -acetaminophen  (NORCO/VICODIN) 5-325 MG per tablet 1 tablet (1 tablet Oral Given 03/12/24 1236)                                    Medical Decision Making Amount  and/or Complexity of Data Reviewed Labs: ordered. Radiology: ordered.  Risk Prescription drug management.   This patient presents to the ED for concern of syncope, this involves an extensive number of treatment options, and is a complaint that carries with it a high risk of complications and morbidity.   Differential diagnosis includes: Hypoglycemia, electrolyte derangement, dehydration, cardiac arrhythmia, anemia, orthostatic hypotension, vertigo, etc.   Comorbidities  See HPI above   Additional History  Additional history obtained from prior records   Cardiac Monitoring / EKG  The patient was maintained on a cardiac monitor.  I personally viewed and interpreted the cardiac monitored which showed: sinus rhythm with a heart rate of 78 bpm.   Lab Tests  I ordered and personally interpreted labs.  The pertinent results include:   CMP is unremarkable Hemoglobin of 9.6, which is within normal limits for patient. CBC is reassuring CBG of 77 Anemia panel within normal limits   Imaging Studies  I ordered imaging studies including CT head, maxillofacial, lumbar spine. X-rays of left shoulder and hips.  I independently visualized and interpreted imaging which showed:  No fracture or dislocation of the left shoulder. No evidence of fracture or dislocation on bilateral hip x-ray. I agree with the radiologist interpretation   Problem List / ED Course / Critical Interventions / Medication Management  Patient came into the ED today after a syncopal event from [employer].  Has an abrasion to the left lateral eyebrow.  Endorses some headaches and lightheadedness. No vision changes or weakness. Has right sided neck pain, left shoulder pain, and left cheek pain. Endorses chronic low back and hip pain which is now worse after the fall. No impaired ROM or decreased strength. Reports previous syncopal event in the past week.  No blood thinner use. There is a mention of jaw pain in  the  triage note which patient denies. States that she has pain to her left cheek. I ordered medications including: NS and Norco for headache Lidocaine  patch for muscle strain  Reevaluation of the patient after these medicines showed that the patient improved I have reviewed the patients home medicines and have made adjustments as needed   Social Determinants of Health  Physical activity   Test / Admission - Considered  Discussed findings with patient.  All questions answered. She is stable and safe for discharge home. Return precautions given.    Final diagnoses:  Syncope, unspecified syncope type    ED Discharge Orders          Ordered    ondansetron  (ZOFRAN -ODT) 4 MG disintegrating tablet  Every 8 hours PRN        03/12/24 1444               Waddell Sluder, PA-C 03/12/24 1453    Laurice Maude BROCKS, MD 03/16/24 1544

## 2024-03-16 ENCOUNTER — Ambulatory Visit (INDEPENDENT_AMBULATORY_CARE_PROVIDER_SITE_OTHER): Admitting: Neurology

## 2024-03-16 VITALS — BP 96/62 | HR 62

## 2024-03-16 DIAGNOSIS — G43711 Chronic migraine without aura, intractable, with status migrainosus: Secondary | ICD-10-CM

## 2024-03-16 MED ORDER — ONABOTULINUMTOXINA 200 UNITS IJ SOLR
155.0000 [IU] | Freq: Once | INTRAMUSCULAR | Status: AC
  Administered 2024-03-16: 155 [IU] via INTRAMUSCULAR

## 2024-03-16 NOTE — Progress Notes (Signed)
 Botox - 200 units x 1 vial Lot: [id number] Expiration: 10.2027 NDC: 0023-3921-02   Bacteriostatic 0.9% Sodium Chloride - 4 mL  Lot: [id number] Expiration: 11.01.2025 NDC: 6213086578   Dx: G43.709 BB Witnessed by: Andres Bangs

## 2024-03-16 NOTE — Progress Notes (Signed)
   BOTOX  PROCEDURE NOTE FOR MIGRAINE HEADACHE   HISTORY: [name] is here for Botox .  Last had Botox  05/05/2023 with me for chronic migraine headache. Has been miserable since Botox , has frequent migraine, can last for days, then become a mild headache that is constant.  Still on Qulipta  30 mg daily. Takes Imitrex  PRN.  Botox  has worked historically great for her. Has G-tube now. Has lost quite a bit of weight since I've been seeing her.   Description of procedure:  The patient was placed in a sitting position. The standard protocol was used for Botox  as follows, with 5 units of Botox  injected at each site:   -Procerus muscle, midline injection  -Corrugator muscle, bilateral injection  -Frontalis muscle, bilateral injection, with 2 sites each side, medial injection was performed in the upper one third of the frontalis muscle, in the region vertical from the medial inferior edge of the superior orbital rim. The lateral injection was again in the upper one third of the forehead vertically above the lateral limbus of the cornea, 1.5 cm lateral to the medial injection site.  -Temporalis muscle injection, 4 sites, bilaterally. The first injection was 3 cm above the tragus of the ear, second injection site was 1.5 cm to 3 cm up from the first injection site in line with the tragus of the ear. The third injection site was 1.5-3 cm forward between the first 2 injection sites. The fourth injection site was 1.5 cm posterior to the second injection site.  -Occipitalis muscle injection, 3 sites, bilaterally. The first injection was done one half way between the occipital protuberance and the tip of the mastoid process behind the ear. The second injection site was done lateral and superior to the first, 1 fingerbreadth from the first injection. The third injection site was 1 fingerbreadth superiorly and medially from the first injection site.  -Cervical paraspinal muscle injection, 2 sites, bilateral,  the first injection site was 1 cm from the midline of the cervical spine, 3 cm inferior to the lower border of the occipital protuberance. The second injection site was 1.5 cm superiorly and laterally to the first injection site.  -Trapezius muscle injection was performed at 3 sites, bilaterally. The first injection site was in the upper trapezius muscle halfway between the inflection point of the neck, and the acromion. The second injection site was one half way between the acromion and the first injection site. The third injection was done between the first injection site and the inflection point of the neck.   A 200 unit bottle of Botox  was used, 155 units were injected, the rest of the Botox  was wasted. The patient tolerated the procedure well, there were no complications of the above procedure.  Botox  NDC 5621-3086-57 Lot number [id number] Expiration date 06/2026 BB  We will continue Botox  for chronic migraine headaches.  She is also on Qulipta  30 mg daily, Imitrex  as needed. She will come back in a few months to re-establish care with Dr. Salli Crawley as primary neurologist. She has complex history. She now has a feeding tube. She mentions report of diplopia (saw ophthalmology, no concerning etiology determined, MG and MUSK AB were negative).  We are following her migraines.

## 2024-03-20 ENCOUNTER — Other Ambulatory Visit: Payer: Self-pay | Admitting: Neurology

## 2024-06-14 ENCOUNTER — Encounter: Payer: Self-pay | Admitting: Neurology

## 2024-06-14 ENCOUNTER — Ambulatory Visit (INDEPENDENT_AMBULATORY_CARE_PROVIDER_SITE_OTHER): Admitting: Neurology

## 2024-06-14 VITALS — BP 128/86 | HR 88

## 2024-06-14 DIAGNOSIS — G43711 Chronic migraine without aura, intractable, with status migrainosus: Secondary | ICD-10-CM

## 2024-06-14 MED ORDER — ONABOTULINUMTOXINA 200 UNITS IJ SOLR
155.0000 [IU] | Freq: Once | INTRAMUSCULAR | Status: AC
  Administered 2024-06-14: 155 [IU] via INTRAMUSCULAR

## 2024-06-14 NOTE — Progress Notes (Signed)
 Botox - 200 units x 1 vial Lot: [id number] Expiration: 07/2026 NDC: 9976-6078-97   Bacteriostatic 0.9% Sodium Chloride - 4 mL  Lot: [id number] Expiration: 07/28/2025 NDC: [id number]   Dx: H56.290 BB Witnessed by: Rojean DEL

## 2024-06-14 NOTE — Progress Notes (Signed)
   BOTOX  PROCEDURE NOTE FOR MIGRAINE HEADACHE  HISTORY:[name] is here for Botox . Last was 03/16/24 with me. Does great with Botox . No headache for the 1st month. Wears off 2 weeks before Botox .   Description of procedure:  The patient was placed in a sitting position. The standard protocol was used for Botox  as follows, with 5 units of Botox  injected at each site:   -Procerus muscle, midline injection  -Corrugator muscle, bilateral injection  -Frontalis muscle, bilateral injection, with 2 sites each side, medial injection was performed in the upper one third of the frontalis muscle, in the region vertical from the medial inferior edge of the superior orbital rim. The lateral injection was again in the upper one third of the forehead vertically above the lateral limbus of the cornea, 1.5 cm lateral to the medial injection site.  -Temporalis muscle injection, 4 sites, bilaterally. The first injection was 3 cm above the tragus of the ear, second injection site was 1.5 cm to 3 cm up from the first injection site in line with the tragus of the ear. The third injection site was 1.5-3 cm forward between the first 2 injection sites. The fourth injection site was 1.5 cm posterior to the second injection site.  -Occipitalis muscle injection, 3 sites, bilaterally. The first injection was done one half way between the occipital protuberance and the tip of the mastoid process behind the ear. The second injection site was done lateral and superior to the first, 1 fingerbreadth from the first injection. The third injection site was 1 fingerbreadth superiorly and medially from the first injection site.  -Cervical paraspinal muscle injection, 2 sites, bilateral, the first injection site was 1 cm from the midline of the cervical spine, 3 cm inferior to the lower border of the occipital protuberance. The second injection site was 1.5 cm superiorly and laterally to the first injection site.  -Trapezius  muscle injection was performed at 3 sites, bilaterally. The first injection site was in the upper trapezius muscle halfway between the inflection point of the neck, and the acromion. The second injection site was one half way between the acromion and the first injection site. The third injection was done between the first injection site and the inflection point of the neck.   A 200 unit bottle of Botox  was used, 155 units were injected, the rest of the Botox  was wasted. The patient tolerated the procedure well, there were no complications of the above procedure.  Botox  NDC 9976-6078-97 Lot number [id number] Expiration date 07/29/2026 BB

## 2024-07-17 ENCOUNTER — Ambulatory Visit: Admitting: Diagnostic Neuroimaging

## 2024-07-25 ENCOUNTER — Ambulatory Visit: Admitting: Neurology

## 2024-08-01 ENCOUNTER — Telehealth: Payer: Self-pay | Admitting: Neurology

## 2024-08-01 NOTE — Telephone Encounter (Signed)
 Patient and her cousin called in stating patient was admitted last week in [place]  for facial numbness and they told her to call us  for a f/u if feeling has not returned to her face after a couple of days. States they got back into town on Saturday and pt still has complete facial numbness, feels weird to talk. I let her know I will request her medical records and once those are received can call her back to schedule a f/u.  Pt was at [facility] in [place], [place]. Faxed request to [phone]  [facility]

## 2024-08-03 ENCOUNTER — Encounter: Payer: Self-pay | Admitting: Neurology

## 2024-08-03 ENCOUNTER — Ambulatory Visit (INDEPENDENT_AMBULATORY_CARE_PROVIDER_SITE_OTHER): Admitting: Neurology

## 2024-08-03 VITALS — BP 136/78 | HR 94 | Resp 15

## 2024-08-03 DIAGNOSIS — G459 Transient cerebral ischemic attack, unspecified: Secondary | ICD-10-CM | POA: Diagnosis not present

## 2024-08-03 DIAGNOSIS — G43711 Chronic migraine without aura, intractable, with status migrainosus: Secondary | ICD-10-CM | POA: Diagnosis not present
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
## 2024-08-04 ENCOUNTER — Ambulatory Visit: Admitting: Podiatry

## 2024-08-09 ENCOUNTER — Emergency Department (HOSPITAL_COMMUNITY)

## 2024-08-09 ENCOUNTER — Other Ambulatory Visit: Payer: Self-pay

## 2024-08-09 ENCOUNTER — Encounter (HOSPITAL_COMMUNITY): Payer: Self-pay

## 2024-08-09 ENCOUNTER — Ambulatory Visit: Admitting: Podiatry

## 2024-08-09 ENCOUNTER — Inpatient Hospital Stay (HOSPITAL_COMMUNITY)
Admission: EM | Admit: 2024-08-09 | Discharge: 2024-08-17 | DRG: 393 | Disposition: A | Discharge status: Home / Self Care | Attending: Emergency Medicine | Admitting: Emergency Medicine

## 2024-08-09 DIAGNOSIS — Z5941 Food insecurity: Secondary | ICD-10-CM

## 2024-08-09 DIAGNOSIS — Z91411 Personal history of adult psychological abuse: Secondary | ICD-10-CM

## 2024-08-09 DIAGNOSIS — F418 Other specified anxiety disorders: Secondary | ICD-10-CM | POA: Diagnosis present

## 2024-08-09 DIAGNOSIS — Z5982 Transportation insecurity: Secondary | ICD-10-CM

## 2024-08-09 DIAGNOSIS — Z6820 Body mass index (BMI) 20.0-20.9, adult: Secondary | ICD-10-CM

## 2024-08-09 DIAGNOSIS — K9423 Gastrostomy malfunction: Secondary | ICD-10-CM | POA: Diagnosis present

## 2024-08-09 DIAGNOSIS — Z8249 Family history of ischemic heart disease and other diseases of the circulatory system: Secondary | ICD-10-CM

## 2024-08-09 DIAGNOSIS — R112 Nausea with vomiting, unspecified: Secondary | ICD-10-CM | POA: Diagnosis not present

## 2024-08-09 DIAGNOSIS — K9589 Other complications of other bariatric procedure: Principal | ICD-10-CM | POA: Diagnosis present

## 2024-08-09 DIAGNOSIS — Z881 Allergy status to other antibiotic agents status: Secondary | ICD-10-CM

## 2024-08-09 DIAGNOSIS — Z96653 Presence of artificial knee joint, bilateral: Secondary | ICD-10-CM | POA: Diagnosis present

## 2024-08-09 DIAGNOSIS — F332 Major depressive disorder, recurrent severe without psychotic features: Secondary | ICD-10-CM | POA: Diagnosis present

## 2024-08-09 DIAGNOSIS — D509 Iron deficiency anemia, unspecified: Secondary | ICD-10-CM | POA: Diagnosis present

## 2024-08-09 DIAGNOSIS — K219 Gastro-esophageal reflux disease without esophagitis: Secondary | ICD-10-CM | POA: Diagnosis present

## 2024-08-09 DIAGNOSIS — Z91410 Personal history of adult physical and sexual abuse: Secondary | ICD-10-CM

## 2024-08-09 DIAGNOSIS — Z9884 Bariatric surgery status: Secondary | ICD-10-CM

## 2024-08-09 DIAGNOSIS — E538 Deficiency of other specified B group vitamins: Secondary | ICD-10-CM | POA: Diagnosis present

## 2024-08-09 DIAGNOSIS — G629 Polyneuropathy, unspecified: Secondary | ICD-10-CM | POA: Diagnosis present

## 2024-08-09 DIAGNOSIS — Z888 Allergy status to other drugs, medicaments and biological substances status: Secondary | ICD-10-CM

## 2024-08-09 DIAGNOSIS — Z880 Allergy status to penicillin: Secondary | ICD-10-CM

## 2024-08-09 DIAGNOSIS — Z79899 Other long term (current) drug therapy: Secondary | ICD-10-CM

## 2024-08-09 DIAGNOSIS — Y832 Surgical operation with anastomosis, bypass or graft as the cause of abnormal reaction of the patient, or of later complication, without mention of misadventure at the time of the procedure: Secondary | ICD-10-CM | POA: Diagnosis present

## 2024-08-09 DIAGNOSIS — Z90710 Acquired absence of both cervix and uterus: Secondary | ICD-10-CM

## 2024-08-09 DIAGNOSIS — G43711 Chronic migraine without aura, intractable, with status migrainosus: Secondary | ICD-10-CM | POA: Diagnosis present

## 2024-08-09 DIAGNOSIS — E440 Moderate protein-calorie malnutrition: Secondary | ICD-10-CM | POA: Diagnosis present

## 2024-08-09 DIAGNOSIS — Z1152 Encounter for screening for COVID-19: Secondary | ICD-10-CM

## 2024-08-09 DIAGNOSIS — K254 Chronic or unspecified gastric ulcer with hemorrhage: Principal | ICD-10-CM | POA: Diagnosis present

## 2024-08-09 DIAGNOSIS — Z825 Family history of asthma and other chronic lower respiratory diseases: Secondary | ICD-10-CM

## 2024-08-09 DIAGNOSIS — K289 Gastrojejunal ulcer, unspecified as acute or chronic, without hemorrhage or perforation: Secondary | ICD-10-CM | POA: Diagnosis present

## 2024-08-09 DIAGNOSIS — Z8719 Personal history of other diseases of the digestive system: Secondary | ICD-10-CM

## 2024-08-09 DIAGNOSIS — K920 Hematemesis: Secondary | ICD-10-CM | POA: Insufficient documentation

## 2024-08-09 DIAGNOSIS — Z23 Encounter for immunization: Secondary | ICD-10-CM

## 2024-08-09 DIAGNOSIS — G8929 Other chronic pain: Secondary | ICD-10-CM | POA: Diagnosis present

## 2024-08-09 DIAGNOSIS — Z87891 Personal history of nicotine dependence: Secondary | ICD-10-CM

## 2024-08-09 DIAGNOSIS — E162 Hypoglycemia, unspecified: Secondary | ICD-10-CM | POA: Diagnosis not present

## 2024-08-09 DIAGNOSIS — Z5948 Other specified lack of adequate food: Secondary | ICD-10-CM

## 2024-08-09 DIAGNOSIS — R109 Unspecified abdominal pain: Secondary | ICD-10-CM | POA: Diagnosis present

## 2024-08-09 DIAGNOSIS — F419 Anxiety disorder, unspecified: Secondary | ICD-10-CM | POA: Diagnosis present

## 2024-08-09 DIAGNOSIS — Z885 Allergy status to narcotic agent status: Secondary | ICD-10-CM

## 2024-08-09 DIAGNOSIS — Z91018 Allergy to other foods: Secondary | ICD-10-CM

## 2024-08-09 LAB — CBC
HCT: 41.4 % (ref 36.0–46.0)
Hemoglobin: 13.8 g/dL (ref 12.0–15.0)
MCH: 30.9 pg (ref 26.0–34.0)
MCHC: 33.3 g/dL (ref 30.0–36.0)
MCV: 92.6 fL (ref 80.0–100.0)
Platelets: 322 K/uL (ref 150–400)
RBC: 4.47 MIL/uL (ref 3.87–5.11)
RDW: 14.4 % (ref 11.5–15.5)
WBC: 12.7 K/uL — ABNORMAL HIGH (ref 4.0–10.5)
nRBC: 0 % (ref 0.0–0.2)

## 2024-08-09 LAB — COMPREHENSIVE METABOLIC PANEL WITH GFR
ALT: 6 U/L (ref 0–44)
AST: 14 U/L — ABNORMAL LOW (ref 15–41)
Albumin: 4.1 g/dL (ref 3.5–5.0)
Alkaline Phosphatase: 68 U/L (ref 38–126)
Anion gap: 9 (ref 5–15)
BUN: 15 mg/dL (ref 6–20)
CO2: 28 mmol/L (ref 22–32)
Calcium: 9.9 mg/dL (ref 8.9–10.3)
Chloride: 102 mmol/L (ref 98–111)
Creatinine, Ser: 0.61 mg/dL (ref 0.44–1.00)
GFR, Estimated: >60 mL/min (ref >60)
Glucose, Bld: 126 mg/dL — ABNORMAL HIGH (ref 70–99)
Potassium: 3.7 mmol/L (ref 3.5–5.1)
Sodium: 139 mmol/L (ref 135–145)
Total Bilirubin: 0.4 mg/dL (ref 0.0–1.2)
Total Protein: 7.1 g/dL (ref 6.5–8.1)

## 2024-08-09 LAB — CBG MONITORING, ED: Glucose-Capillary: 120 mg/dL — ABNORMAL HIGH (ref 70–99)

## 2024-08-09 LAB — LIPASE, BLOOD: Lipase: 45 U/L (ref 11–51)

## 2024-08-09 LAB — POC OCCULT BLOOD, ED: Fecal Occult Bld: NEGATIVE

## 2024-08-09 MED ORDER — ALUM & MAG HYDROXIDE-SIMETH 200-200-20 MG/5ML PO SUSP
30.0000 mL | Freq: Once | ORAL | Status: AC
  Administered 2024-08-09: 30 mL via ORAL
  Filled 2024-08-09: qty 30

## 2024-08-09 MED ORDER — ONDANSETRON HCL 4 MG/2ML IJ SOLN
4.0000 mg | Freq: Once | INTRAMUSCULAR | Status: AC
  Administered 2024-08-09: 4 mg via INTRAVENOUS
  Filled 2024-08-09: qty 2

## 2024-08-09 MED ORDER — SODIUM CHLORIDE 0.9 % IV SOLN
12.5000 mg | Freq: Once | INTRAVENOUS | Status: AC
  Administered 2024-08-09: 12.5 mg via INTRAVENOUS
  Filled 2024-08-09: qty 12.5
  Filled 2024-08-09: qty 0.5

## 2024-08-09 MED ORDER — DIAZEPAM 5 MG/ML IJ SOLN
5.0000 mg | Freq: Once | INTRAMUSCULAR | Status: AC
  Administered 2024-08-09: 5 mg via INTRAVENOUS
  Filled 2024-08-09: qty 2

## 2024-08-09 MED ORDER — HYDROMORPHONE HCL 1 MG/ML IJ SOLN
1.0000 mg | Freq: Once | INTRAMUSCULAR | Status: AC
  Administered 2024-08-09: 1 mg via INTRAVENOUS
  Filled 2024-08-09: qty 1

## 2024-08-09 MED ORDER — ONDANSETRON HCL 4 MG/2ML IJ SOLN
4.0000 mg | Freq: Four times a day (QID) | INTRAMUSCULAR | Status: AC | PRN
  Administered 2024-08-10 – 2024-08-12 (×6): 4 mg via INTRAVENOUS
  Filled 2024-08-09 (×6): qty 2

## 2024-08-09 MED ORDER — SODIUM CHLORIDE 0.9 % IV BOLUS
1000.0000 mL | Freq: Once | INTRAVENOUS | Status: AC
  Administered 2024-08-09: 1000 mL via INTRAVENOUS

## 2024-08-09 MED ORDER — PANTOPRAZOLE SODIUM 40 MG IV SOLR
40.0000 mg | Freq: Two times a day (BID) | INTRAVENOUS | Status: DC
  Administered 2024-08-10 – 2024-08-17 (×16): 40 mg via INTRAVENOUS
  Filled 2024-08-09 (×16): qty 10

## 2024-08-09 MED ORDER — IOHEXOL 300 MG/ML  SOLN
100.0000 mL | Freq: Once | INTRAMUSCULAR | Status: AC | PRN
  Administered 2024-08-09: 100 mL via INTRAVENOUS

## 2024-08-09 MED ORDER — DEXTROSE IN LACTATED RINGERS 5 % IV SOLN: INTRAVENOUS | Status: AC

## 2024-08-09 NOTE — ED Provider Notes (Signed)
 [place] [facility]ERGENCY DEPARTMENT AT [facility] Provider Note   CSN: [id number] Arrival date & time: 08/09/24  1708     Patient presents with: Abdominal Pain   [name] is a 48 y.o. female.   The history is provided by the patient and medical records. No language interpreter was used.  Abdominal Pain    48 year old female with significant GI history including gastric sleeve surgery, multiple revision, has feeding tube, has chronic anemia with a history of ulcerative colitis presenting with complaint of abdominal pain.  Patient states for the past 3 days she has had persistent abdominal pain with associate nausea and vomiting.  States she vomited copious amount of coffee-ground emesis.  States she is unable to keep any of her medication down because of it.  States that she is currently managed by pain management and she does have a pain medication but she is unable to keep down.  Her last bowel movement was today.  She attributed her vomiting and pain to her stomach ulcer.  Endorses significant ongoing pain with persistent nausea.  She does have a feeding tube and use it for most things but states she does take a medication by mouth.  She does not endorse any fever or chills no chest pain or shortness of breath.  Prior to Admission medications   Medication Sig Start Date End Date Taking? Authorizing Provider  ACCRUFER 30 MG CAPS Take 1 capsule by mouth 2 (two) times daily.    [provider]  acetaminophen  (TYLENOL ) 500 MG tablet Take 2 tablets (1,000 mg total) by mouth every 6 (six) hours as needed. 09/16/23 09/15/24  Stechschulte, Deward PARAS, MD  albuterol  (VENTOLIN  HFA) [phone] Base) MCG/ACT inhaler Inhale 2 puffs into the lungs every 6 (six) hours as needed for wheezing or shortness of breath.    [provider]  atorvastatin  (LIPITOR) 40 MG tablet Take 40 mg by mouth daily. 12/10/22   [provider]  botulinum toxin Type A  (BOTOX ) 200  units injection INJECT 155 UNITS INTRAMUSCULARLY INTO HEAD AND NECK MUSCLES EVERY 3 MONTHS 11/23/22   Onita Duos, MD  Buprenorphine  HCl (BELBUCA ) 75 MCG FILM Place 300 mcg under the tongue in the morning and at bedtime.    [provider]  buPROPion  (WELLBUTRIN  XL) 300 MG 24 hr tablet Take 300 mg by mouth every morning. Take along with 150 mg=450 mg 06/05/20   [provider]  busPIRone  (BUSPAR ) 15 MG tablet Take 15 mg by mouth 3 (three) times daily.    [provider]  Cholecalciferol (VITAMIN D3) 1.25 MG (50000 UT) CAPS Take 50,000 Units by mouth every 7 (seven) days.  04/03/19   [provider]  clonazePAM  (KLONOPIN ) 1 MG tablet Take 1 mg by mouth 2 (two) times daily as needed for anxiety.    [provider]  cyanocobalamin  (,VITAMIN B-12,) 1000 MCG/ML injection Inject 1,000 mcg into the muscle 2 (two) times a week.  04/06/19   [provider]  diclofenac  Sodium (VOLTAREN ) 1 % GEL Apply 2 g topically 4 (four) times daily as needed (Pain).    [provider]  dicyclomine  (BENTYL ) 10 MG capsule Take 10 mg by mouth 4 (four) times daily. 12/17/20   [provider]  DULoxetine  (CYMBALTA ) 60 MG capsule Take 1 capsule (60 mg total) by mouth 2 (two) times daily. 04/10/19   Wonda Clarita BRAVO, NP  EPINEPHrine  0.3 mg/0.3 mL IJ SOAJ injection Inject 0.3 mg into the muscle as  needed for anaphylaxis. 04/12/21   Eudelia Maude SAUNDERS, PA-C  famotidine  (PEPCID ) 20 MG tablet Take 20 mg by mouth 2 (two) times daily.    [provider]  ferrous sulfate  325 (65 FE) MG tablet Take 1 tablet (325 mg total) by mouth daily with breakfast. 01/29/23   Cheryle Page, MD  fluticasone  (FLONASE ) 50 MCG/ACT nasal spray Place 2 sprays into both nostrils daily as needed for allergies or rhinitis.    [provider]  gabapentin  (NEURONTIN ) 600 MG tablet Take 600 mg by mouth 3 (three) times daily.    [provider]  hydrocortisone  (ANUSOL -HC) 2.5 %  rectal cream Place 1 Application rectally daily as needed for hemorrhoids. 07/29/18   [provider]  HYDROmorphone  (DILAUDID ) 4 MG tablet Take 4 mg by mouth every 6 (six) hours as needed. 09/17/21   [provider]  linaclotide  (LINZESS ) 290 MCG CAPS capsule Take 290 mcg by mouth daily before breakfast.    [provider]  magnesium  oxide (MAG-OX) 400 (240 Mg) MG tablet Take 400 mg by mouth daily. 12/19/22   [provider]  meclizine  (ANTIVERT ) 25 MG tablet Take 25 mg by mouth 3 (three) times daily. 04/15/22   [provider]  mesalamine  (LIALDA ) 1.2 g EC tablet Take 2.4 g by mouth daily with breakfast.    [provider]  midodrine  (PROAMATINE ) 5 MG tablet Take 15 mg by mouth 3 (three) times daily with meals.    [provider]  Multiple Vitamin (MULTIVITAMIN WITH MINERALS) TABS tablet Take 1 tablet by mouth daily.    [provider]  NARCAN  4 MG/0.1ML LIQD nasal spray kit Place 1 spray into the nose daily as needed (overdose). 03/18/20   [provider]  Nutritional Supplements (TWOCAL HN 2.0) LIQD 711 mLs by Per J Tube route at bedtime.    [provider]  ondansetron  (ZOFRAN -ODT) 8 MG disintegrating tablet Take 8 mg by mouth 3 (three) times daily. 06/13/24   [provider]  oxybutynin  (DITROPAN ) 5 MG tablet Take 10 mg by mouth at bedtime.    [provider]  QULIPTA  30 MG TABS TAKE 1 TABLET BY MOUTH EVERY DAY 12/27/23   Gayland Lauraine PARAS, NP  sucralfate  (CARAFATE ) 1 g tablet Take 1 tablet (1 g total) by mouth every 6 (six) hours. Patient taking differently: Take 1 g by mouth 4 (four) times daily. 01/21/23   Amin, Sumayya, MD  SUMAtriptan  (IMITREX ) 50 MG tablet TAKE 1 TABLET BY MOUTH EVERY 2 HOURS AS NEEDED FOR MIGRAINE, MAY REPEAT IN 2 HOURS IF HEADACHE PERSISTS OR REOCCURS 03/21/24   Gayland Lauraine PARAS, NP  SYMPROIC  0.2 MG TABS Take 0.2 mg by mouth daily. 05/13/21   [provider]  thiamine   (VITAMIN B-1) 100 MG tablet Take 100 mg by mouth daily at 12 noon.     [provider]  torsemide  (DEMADEX ) 5 MG tablet Take 5 mg by mouth daily. 11/10/21   [provider]  valACYclovir  (VALTREX ) 1000 MG tablet Take 1,000 mg by mouth 2 (two) times daily as needed.    [provider]    Allergies: Ciprofloxacin , Coconut (cocos nucifera), Coconut oil, Doxycycline, Oxycodone, Oxycodone-acetaminophen , Penicillamine, Trazodone  and nefazodone, Vistaril  [hydroxyzine  hcl], Morphine , Silicone, Wound dressing adhesive, Hydroxyzine , Nefazodone, Penicillin g, Penicillins, Tape, and Trazodone     Review of Systems  Gastrointestinal:  Positive for abdominal pain.  All other systems reviewed and are negative.   Updated Vital Signs BP (!) 125/95 (BP Location: Right  Arm)   Pulse 86   Temp 98.6 F (37 C) (Oral)   Resp 18   LMP 06/02/2017 Comment: hysterectomy 06/08/2017  SpO2 100%   Physical Exam Vitals and nursing note reviewed.  Constitutional:      General: She is in acute distress.     Appearance: She is well-developed.     Comments: Patient appears uncomfortable, dry heaving  HENT:     Head: Atraumatic.  Eyes:     Conjunctiva/sclera: Conjunctivae normal.  Cardiovascular:     Rate and Rhythm: Normal rate and regular rhythm.  Pulmonary:     Effort: Pulmonary effort is normal.     Breath sounds: No wheezing, rhonchi or rales.  Abdominal:     General: Bowel sounds are normal.     Tenderness: There is abdominal tenderness (Diffuse tenderness with guarding).     Comments: G-tube is in place  Musculoskeletal:     Cervical back: Neck supple.  Skin:    Findings: No rash.  Neurological:     Mental Status: She is alert.  Psychiatric:        Mood and Affect: Mood normal.     (all labs ordered are listed, but only abnormal results are displayed) Labs Reviewed  COMPREHENSIVE METABOLIC PANEL WITH GFR - Abnormal; Notable for the following components:      Result  Value   Glucose, Bld 126 (*)    AST 14 (*)    All other components within normal limits  CBC - Abnormal; Notable for the following components:   WBC 12.7 (*)    All other components within normal limits  CBG MONITORING, ED - Abnormal; Notable for the following components:   Glucose-Capillary 120 (*)    All other components within normal limits  LIPASE, BLOOD  URINALYSIS, ROUTINE W REFLEX MICROSCOPIC  OCCULT BLOOD, GASTRIC FLUID (SPECIMEN CUP)  OCCULT BLOOD X 1 CARD TO LAB, STOOL  POC OCCULT BLOOD, ED    EKG: None  Radiology: CT ABDOMEN PELVIS W CONTRAST Result Date: 08/09/2024 EXAM: CT ABDOMEN AND PELVIS WITH CONTRAST 08/09/2024 08:25:04 PM TECHNIQUE: CT of the abdomen and pelvis was performed with the administration of 100 mL of iohexol  (OMNIPAQUE ) 300 MG/ML solution. Multiplanar reformatted images are provided for review. Automated exposure control, iterative reconstruction, and/or weight-based adjustment of the mA/kV was utilized to reduce the radiation dose to as low as reasonably achievable. COMPARISON: 10/01/2023 CLINICAL HISTORY: Abdominal pain, acute, nonlocalized; hematemesis, abd pain, hx of UC and stomach ulcer. FINDINGS: LOWER CHEST: No acute abnormality. LIVER: Subcentimeter right hepatic cyst, benign. GALLBLADDER AND BILE DUCTS: Gallbladder is unremarkable. No biliary ductal dilatation. SPLEEN: 2.3 cm splenic cyst unchanged, benign. PANCREAS: No acute abnormality. ADRENAL GLANDS: No acute abnormality. KIDNEYS, URETERS AND BLADDER: No stones in the kidneys or ureters. No hydronephrosis. No perinephric or periureteral stranding. Urinary bladder is unremarkable. GI AND BOWEL: Status post gastric bypass with patent gastrojejunostomy. Indwelling percutaneous gastrostomy within the bypassed gastric antrum. Moderate colonic stool burden, suggesting mild constipation. Short segment small bowel intussusception in the left hemiabdomen, typically a transient finding. PERITONEUM AND  RETROPERITONEUM: Small volume free fluid in the pelvis. No free air. VASCULATURE: Aorta is normal in caliber. LYMPH NODES: No lymphadenopathy. REPRODUCTIVE ORGANS: Status post hysterectomy. BONES AND SOFT TISSUES: No acute osseous abnormality. No focal soft tissue abnormality. IMPRESSION: 1. Status post gastric bypass with patent gastrojejunostomy. No evidence of bowel obstruction. 2. Indwelling percutaneous gastrostomy within the bypassed gastric antrum. 3. Additional ancillary findings, as above. Electronically signed by:  Pinkie Pebbles MD 08/09/2024 08:32 PM EST RP Workstation: [id number]   DG ABD ACUTE 2+V W 1V CHEST Result Date: 08/09/2024 CLINICAL DATA:  Abdomen pain vomiting blood EXAM: DG ABDOMEN ACUTE WITH 1 VIEW CHEST COMPARISON:  02/19/2024, chest x-ray 08/21/2019 FINDINGS: Single-view chest demonstrates no focal opacity or pleural effusion. Normal cardiomediastinal silhouette. No pneumothorax. Supine and upright views of the abdomen are obtained. Postsurgical changes in the left upper quadrant. Possible dilated segment of small bowel measuring up to 3.3 cm in the left upper quadrant. Scattered bowel gas elsewhere. Catheter in the right upper quadrant with tip projecting in the mid abdominal region. 11 mm metallic screw projecting over the right mid quadrant possibly in the Lutz. Sacral stimulator device is noted. IMPRESSION: 1. No acute cardiopulmonary disease. 2. Possible dilated segment of small bowel in the left upper quadrant, nonspecific but could be due to focal ileus or partial obstruction. 3. 11 mm metallic screw projecting over the right mid quadrant possibly in the Lutz. Correlate for any history of foreign body ingestion 4. Catheter or possible enteric tube overlying the right upper quadrant, the tip is seen to the right of the L2 vertebral body Electronically Signed   By: Luke Bun M.D.   On: 08/09/2024 20:14     Procedures   Medications Ordered in the ED  HYDROmorphone   (DILAUDID ) injection 1 mg (has no administration in time range)  alum & mag hydroxide-simeth (MAALOX/MYLANTA) 200-200-20 MG/5ML suspension 30 mL (has no administration in time range)  ondansetron  (ZOFRAN ) injection 4 mg (4 mg Intravenous Given 08/09/24 1835)  sodium chloride  0.9 % bolus 1,000 mL (1,000 mLs Intravenous New Bag/Given 08/09/24 1913)  HYDROmorphone  (DILAUDID ) injection 1 mg (1 mg Intravenous Given 08/09/24 1913)  promethazine  (PHENERGAN ) 12.5 mg in sodium chloride  0.9 % 50 mL IVPB (12.5 mg Intravenous New Bag/Given 08/09/24 2033)  diazepam (VALIUM) injection 5 mg (5 mg Intravenous Given 08/09/24 1959)  iohexol  (OMNIPAQUE ) 300 MG/ML solution 100 mL (100 mLs Intravenous Contrast Given 08/09/24 2013)                                    Medical Decision Making Amount and/or Complexity of Data Reviewed Labs: ordered. Radiology: ordered.  Risk Prescription drug management.   BP (!) 136/90   Pulse 73   Temp 98.6 F (37 C) (Oral)   Resp 18   LMP 06/02/2017 Comment: hysterectomy 06/08/2017  SpO2 94%   32:25 PM   48 year old female with significant GI history including gastric sleeve surgery, multiple revision, has feeding tube, has chronic anemia with a history of ulcerative colitis presenting with complaint of abdominal pain.  Patient states for the past 3 days she has had persistent abdominal pain with associate nausea and vomiting.  States she vomited copious amount of coffee-ground emesis.  States she is unable to keep any of her medication down because of it.  States that she is currently managed by pain management and she does have a pain medication but she is unable to keep down.  Her last bowel movement was today.  She attributed her vomiting and pain to her stomach ulcer.  Endorses significant ongoing pain with persistent nausea.  She does have a feeding tube and use it for most things but states she does take a medication by mouth.  She does not endorse any fever or chills  no chest pain or shortness of breath.  On exam patient  appears uncomfortable, holding emesis bag with emesis in it.  She does have diffuse abdominal tenderness with guarding.  Bowel sounds present.  She does have a gastrostomy tube.  -Labs ordered, independently viewed and interpreted by me.  Labs remarkable for elevated white count of 12.7.  Her hemoglobin today is 13.8 however baseline hemoglobin is usually around 9.  I wonder if this is due to dehydration.  She has normal renal function and electrolyte panels are reassuring.  gastric fluid occult blood test is currently pending however her fecal occult blood test is negative. -The patient was maintained on a cardiac monitor.  I personally viewed and interpreted the cardiac monitored which showed an underlying rhythm of: Normal sinus rhythm -Imaging independently viewed and interpreted by me and I agree with radiologist's interpretation.  Result remarkable for initially abdominal x-ray shows a possible dilated segment of the small bowel in the left upper quadrant which is nonspecific but could due to focal ileus or partial obstruction.  There is also an 11 mm metallic screw projecting the right mid quadrant.  A CT scan of the abdomen pelvis was obtained without any evidence of small bowel obstruction.  There is a short segment small bowel intussusception in the left hemiabdomen which is typically a transient finding. -This patient presents to the ED for concern of abdominal pain, this involves an extensive number of treatment options, and is a complaint that carries with it a high risk of complications and morbidity.  The differential diagnosis includes colitis, SBO, diverticulitis, gastritis, GERD, peptic ulcer disease, ischemic colitis, viral GI -Co morbidities that complicate the patient evaluation includes chronic anemia, ulcerative colitis -Treatment includes multiple dose of Dilaudid , antiemetic, and IV fluid -Reevaluation of the patient after  these medicines showed that the patient improved -PCP office notes or outside notes reviewed -Discussion with specialist [facility] Dr. Franky who agrees to admit pt but request general surgery to be consulted as well.  I have consulted general surgery DR. Vernetta who agrees with medicine admission.  Bariatric specialist will f/u on her tomorrow  -Escalation to admission/observation considered: patient agrees with admission.       Final diagnoses:  Intractable nausea and vomiting    ED Discharge Orders     None          [name], [name] 08/09/24 2150    Randol Simmonds, MD 08/11/24 1251

## 2024-08-09 NOTE — ED Triage Notes (Addendum)
 Pt reports with abdominal pain and vomiting blood x 2 days. Pt has a feeding tube in place. Pt reports that the back of her head hurts and she woke up on the floor.

## 2024-08-09 NOTE — H&P (Addendum)
 History and Physical    [name] FMW:[id number] DOB: [birth year] DOA: 08/09/2024  Patient coming from: Home.  Chief Complaint: Nausea vomiting.  HPI: [name] is a 48 y.o. 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  Denies any diarrhea.  Patient states she is on chronic pain relief medication including Dilaudid  and Suboxone which she was not able to take because of the vomiting.  She had some episodes of diarrhea over the last few days but usually she is constipated.  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  Also had a repeat EGD in June 2025 at [facility] results of which are not accessible.  ED Course: In the ER patient had a CT abdomen pelvis shows transient intussusception like symptom features for which general surgery has been consulted.  Labs show WBC of 12.7 hemoglobin 13.8 creatinine 1.6.  Patient started on Protonix  IV given the hematemesis admitted for further workup.  Fecal occult blood was negative.  Review of Systems: As per HPI, rest all negative.   Past Medical History:  Diagnosis Date   Anal fissure - posterior 10/16/2014    OCC  ISSUES   Anxiety    doesn't take anything   Asthma    has Albuterol  inhaler as needed   Bronchitis    in winter   Chronic headache disorder 07/22/2016   Clostridium difficile infection 04/20/2012   Dehydration    Depression    Family history of adverse reaction to anesthesia    pt mom gets sick   GERD (gastroesophageal reflux disease)    takes Pantoprazole  daily   Headache    Hiatal hernia    neuropathy - mild in arms and legs   History of blood transfusion    last transfusion was 04/04/2016=Benadryl  was given d/t itching. States she always itches with transfusion.    History of  migraine    last one 05/01/16   History of stomach ulcers    History of urinary tract infection LAST 2 WEEKS AGO   IDA (iron deficiency anemia)    Internal and external bleeding hemorrhoids 06/11/2014   Joint pain    Joint swelling    Left sided chronic colitis - segmental 06/11/2014   Marginal ulcer 10/25/2017   Motion sickness    Nausea    takes Zofran  as needed   Nausea and vomiting    for 1 year   Oligouria    Osteoarthritis    lower back, knees, wrists - no meds   Peripheral neuropathy 03/01/2019   Personal history of gastric bypass    Postoperative nausea and vomiting 01/21/2016   wants scopolamine  patch   Right carpal tunnel syndrome 03/01/2019   SVD (spontaneous vaginal delivery)    x 4   TOBACCO USER 10/02/2009   Qualifier: Diagnosis of  By: Lida MD, Artist     Tremor 08/31/2018   UC (ulcerative colitis) (HCC)    supposed to be taking Lialda  and Bentyl  but has been off since gastric sleeve   Unsteady gait when walking 2024   uses a walker   Vertigo    doesn't take any meds    Past Surgical History:  Procedure Laterality Date   ABDOMINAL HYSTERECTOMY     PARTIAL   BIOPSY  01/20/2023   Procedure: BIOPSY;  Surgeon: Saintclair Jasper, MD;  Location: [facility];  Service: Gastroenterology;;   BLADDER STIMULATOR IMPLANT     COLONOSCOPY  2007   for rectal bleeding; [facility]   COLONOSCOPY N/A 06/11/2014   Procedure: COLONOSCOPY;  Surgeon: Lupita FORBES Commander, MD;  Location: [facility];  Service: Endoscopy;  Laterality: N/A;   COLONOSCOPY WITH PROPOFOL  N/A 03/02/2017   Procedure: COLONOSCOPY WITH PROPOFOL ;  Surgeon: Ethyl Lenis, MD;  Location: [facility];  Service: General;  Laterality: N/A;   DILATION AND CURETTAGE OF UTERUS     ESOPHAGOGASTRODUODENOSCOPY N/A 07/25/2023   Procedure: ESOPHAGOGASTRODUODENOSCOPY (EGD);  Surgeon: Kriss Estefana DEL, DO;  Location: [facility];  Service: Gastroenterology;  Laterality: N/A;   ESOPHAGOGASTRODUODENOSCOPY N/A 10/02/2023    Procedure: ESOPHAGOGASTRODUODENOSCOPY (EGD);  Surgeon: Burnette Fallow, MD;  Location: [facility];  Service: Gastroenterology;  Laterality: N/A;   ESOPHAGOGASTRODUODENOSCOPY (EGD) WITH PROPOFOL  N/A 04/06/2016   Procedure: ESOPHAGOGASTRODUODENOSCOPY (EGD) WITH PROPOFOL ;  Surgeon: Lenis Ethyl, MD;  Location: [facility];  Service: General;  Laterality: N/A;   ESOPHAGOGASTRODUODENOSCOPY (EGD) WITH PROPOFOL  N/A 03/02/2017   Procedure: ESOPHAGOGASTRODUODENOSCOPY (EGD) WITH PROPOFOL ;  Surgeon: Ethyl Lenis, MD;  Location: [facility];  Service: General;  Laterality: N/A;   ESOPHAGOGASTRODUODENOSCOPY (EGD) WITH PROPOFOL  N/A 05/20/2017   Procedure: ESOPHAGOGASTRODUODENOSCOPY (EGD) WITH PROPOFOL  ERAS PATHWAY;  Surgeon: Ethyl Lenis, MD;  Location: [facility];  Service: General;  Laterality: N/A;   ESOPHAGOGASTRODUODENOSCOPY (EGD) WITH PROPOFOL  N/A 10/07/2017   Procedure: ESOPHAGOGASTRODUODENOSCOPY (EGD) WITH PROPOFOL ;  Surgeon: Ethyl Lenis, MD;  Location: [facility];  Service: General;  Laterality: N/A;   ESOPHAGOGASTRODUODENOSCOPY (EGD) WITH PROPOFOL  N/A 01/20/2023   Procedure: ESOPHAGOGASTRODUODENOSCOPY (EGD) WITH PROPOFOL ;  Surgeon: Saintclair Jasper, MD;  Location: [facility];  Service: Gastroenterology;  Laterality: N/A;   EXCISION OF SKIN TAG  06/08/2017   Procedure: EXCISION OF VULVAR SKIN TAGS X2;  Surgeon: Fredirick Glenys RAMAN, MD;  Location: [facility];  Service: Gynecology;;   FOOT SURGERY Bilateral    x 2   GASTRIC ROUX-EN-Y N/A 12/14/2016   Procedure: LAPAROSCOPIC REVISION SLEEVE GASTRECTOMY TO  ROUX-Y-GASTRIC BY-PASS, UPPER ENDO;  Surgeon: Morene Olives, MD;  Location: [facility];  Service: General;  Laterality: N/A;   GASTROJEJUNOSTOMY N/A 05/11/2016   Procedure: LAPAROSCOPIC PLACEMENT  OF FEEDING  JEJUNOSTOMY TUBE;  Surgeon: Morene Olives, MD;  Location: [facility];  Service: General;  Laterality: N/A;   HEMORRHOIDECTOMY WITH HEMORRHOID BANDING     HEMOSTASIS CLIP PLACEMENT  07/25/2023    Procedure: HEMOSTASIS CLIP PLACEMENT;  Surgeon: Kriss Estefana DEL, DO;  Location: [facility];  Service: Gastroenterology;;   HOT HEMOSTASIS N/A 07/25/2023   Procedure: HOT HEMOSTASIS (ARGON PLASMA COAGULATION/BICAP);  Surgeon: Kriss Estefana DEL, DO;  Location: [facility];  Service: Gastroenterology;  Laterality: N/A;   IR FLUORO GUIDE CV LINE RIGHT  04/06/2017   IR GASTROSTOMY TUBE MOD SED  08/25/2023   IR GENERIC HISTORICAL  05/19/2016   IR CM INJ ANY COLONIC TUBE W/FLUORO 05/19/2016 Marcey Moan, MD [facility]   IR PATIENT EVAL TECH 0-60 MINS  11/30/2017   IR REPLC DUODEN/JEJUNO TUBE PERCUT W/FLUORO  03/14/2018   IR REPLC DUODEN/JEJUNO TUBE PERCUT W/FLUORO  03/15/2018   IR US  GUIDE VASC ACCESS LEFT  08/25/2023   IR US  GUIDE VASC ACCESS LEFT  08/25/2023   IR US  GUIDE VASC ACCESS RIGHT  04/06/2017   j tube removed march 2018     KNEE ARTHROSCOPY Left 09/06/2014   LAPAROSCOPIC GASTRIC SLEEVE RESECTION N/A 01/14/2016   Procedure: LAPAROSCOPIC GASTRIC SLEEVE RESECTION;  Surgeon: Morene  Hoxworth, MD;  Location: [facility];  Service: General;  Laterality: N/A;   LAPAROSCOPIC INSERTION GASTROSTOMY TUBE N/A 09/14/2023   Procedure: LAPAROSCOPIC REMNANT GASTROSTOMY TUBE PLACEMENT;  Surgeon: Lyndel Deward PARAS, MD;  Location: [facility];  Service: General;  Laterality: N/A;   LAPAROSCOPIC REVISION OF GASTROJEJUNOSTOMY N/A 10/25/2017   Procedure: LAPAROSCOPIC REVISION OF GASTROJEJUNOSTOMY AND PARTIAL GASTRECTOMY, WITH PLACEMENT OF FEEDING GASTROSTOMY TUBE;  Surgeon: Mikell Katz, MD;  Location: [facility];  Service: General;  Laterality: N/A;   LAPAROSCOPIC TUBAL LIGATION  10/16/2011   Procedure: LAPAROSCOPIC TUBAL LIGATION;  Surgeon: Nena DELENA App, MD;  Location: [facility];  Service: Gynecology;  Laterality: Bilateral;   NOVASURE ABLATION  09/28/2010   mild persistent vaginal bleeding   right knee arthroscopy     05/07/2016   TOTAL KNEE ARTHROPLASTY Left 03/10/2018   Procedure: LEFT TOTAL  KNEE ARTHROPLASTY;  Surgeon: Ernie Cough, MD;  Location: [facility];  Service: Orthopedics;  Laterality: Left;  70 mins   TOTAL KNEE ARTHROPLASTY Right 01/23/2020   Procedure: TOTAL KNEE ARTHROPLASTY, BILATERAL TROCHANTERIC INJECTION;  Surgeon: Ernie Cough, MD;  Location: [facility];  Service: Orthopedics;  Laterality: Right;  70 mins for Bilateral Troch injection 1 cc Depo and 2 CC Lidocaine    VAGINAL HYSTERECTOMY Bilateral 06/08/2017   Procedure: HYSTERECTOMY VAGINAL W/ BILATERAL SALPINGECTOMY;  Surgeon: Fredirick Glenys RAMAN, MD;  Location: [facility];  Service: Gynecology;  Laterality: Bilateral;   wisdom teeth extracted       reports that she quit smoking about 10 years ago. Her smoking use included cigarettes. She started smoking about 30 years ago. She has a 10 pack-year smoking history. She has never used smokeless tobacco. She reports that she does not currently use alcohol. She reports that she does not use drugs.  Allergies  Allergen Reactions   Ciprofloxacin  Itching, Rash and Dermatitis    Other Reaction(s): ras, Unknown   Coconut (Cocos Nucifera) Anaphylaxis and Itching   Coconut Oil Anaphylaxis, Itching and Other (See Comments)    Other Reaction(s): Anaphylactic reaction   Doxycycline Nausea And Vomiting and Other (See Comments)    Other Reaction(s): Nausea & vomiting, Unknown   Oxycodone Anaphylaxis    Other Reaction(s): Anaphylactic reaction, Unknown   Oxycodone-Acetaminophen  Anaphylaxis   Penicillamine Hives and Other (See Comments)    Other Reaction(s): Hives, Not available   Trazodone  And Nefazodone Rash   Vistaril  [Hydroxyzine  Hcl] Hives   Morphine      Other Reaction(s): Unknown   Silicone    Wound Dressing Adhesive     Other Reaction(s): Not available   Hydroxyzine      Other Reaction(s): Not available, Rash   Nefazodone     Other Reaction(s): Rash   Penicillin G     Other Reaction(s): Rash, Unknown   Penicillins Rash    Has patient had a PCN reaction causing immediate  rash, facial/tongue/throat swelling, SOB or lightheadedness with hypotension: Yes Has patient had a PCN reaction causing severe rash involving mucus membranes or skin necrosis: No Has patient had a PCN reaction that required hospitalization No Has patient had a PCN reaction occurring within the last 10 years: No If all of the above answers are NO, then may proceed with Cephalosporin use.   Tape Rash    And paper tape causes a rash if wearing for a prolong period of time  Other Reaction(s): Unknown   Trazodone      Other Reaction(s): Rash, Unknown    Family History  Problem Relation Age of Onset   Hypertension  Mother    Diabetes Mother    Sarcoidosis Mother        lungs and skin   Asthma Mother    Hypertension Father    Diabetes Father    Asthma Son    Tics Son    Asthma Sister    Cancer Sister        possible pancreatic cancer   Adrenal disorder Sister        Tumor    Asthma Brother    Asthma Daughter        died age 53.5   Cancer Daughter 4       brain; died age 30.5   Asthma Son    Cancer Paternal Aunt        brain, colon, lung and esophagus; unsure of primary    Breast cancer Paternal Aunt     Prior to Admission medications   Medication Sig Start Date End Date Taking? Authorizing Provider  ACCRUFER 30 MG CAPS Take 1 capsule by mouth 2 (two) times daily.   Yes [provider]  acetaminophen  (TYLENOL ) 500 MG tablet Take 2 tablets (1,000 mg total) by mouth every 6 (six) hours as needed. 09/16/23 09/15/24 Yes Stechschulte, Deward PARAS, MD  albuterol  (VENTOLIN  HFA) 108 (90 Base) MCG/ACT inhaler Inhale 2 puffs into the lungs every 6 (six) hours as needed for wheezing or shortness of breath.   Yes [provider]  atorvastatin  (LIPITOR) 40 MG tablet Take 40 mg by mouth daily. 12/10/22  Yes [provider]  botulinum toxin Type A  (BOTOX ) 200 units injection INJECT 155 UNITS INTRAMUSCULARLY INTO HEAD AND NECK MUSCLES EVERY 3 MONTHS 11/23/22  Yes Onita Duos,  MD  Buprenorphine  HCl (BELBUCA ) 75 MCG FILM Place 600 mcg under the tongue in the morning and at bedtime.   Yes [provider]  buPROPion  (WELLBUTRIN  SR) 150 MG 12 hr tablet Take 150 mg by mouth 2 (two) times daily.   Yes [provider]  buPROPion  (WELLBUTRIN  XL) 300 MG 24 hr tablet Take 300 mg by mouth every morning. Take along with 150 mg=450 mg 06/05/20  Yes [provider]  busPIRone  (BUSPAR ) 15 MG tablet Take 15 mg by mouth 3 (three) times daily.   Yes [provider]  Cholecalciferol (VITAMIN D3) 1.25 MG (50000 UT) CAPS Take 50,000 Units by mouth every 7 (seven) days.  04/03/19  Yes [provider]  clonazePAM  (KLONOPIN ) 1 MG tablet Take 1 mg by mouth 2 (two) times daily as needed for anxiety.   Yes [provider]  cyanocobalamin  (,VITAMIN B-12,) 1000 MCG/ML injection Inject 1,000 mcg into the muscle 2 (two) times a week.  04/06/19  Yes [provider]  diclofenac  Sodium (VOLTAREN ) 1 % GEL Apply 2 g topically 4 (four) times daily as needed (Pain).   Yes [provider]  dicyclomine  (BENTYL ) 10 MG capsule Take 10 mg by mouth 4 (four) times daily. 12/17/20  Yes [provider]  DULoxetine  (CYMBALTA ) 60 MG capsule Take 1 capsule (60 mg total) by mouth 2 (two) times daily. 04/10/19  Yes Wonda Clarita BRAVO, NP  EPINEPHrine  0.3 mg/0.3 mL IJ SOAJ injection Inject 0.3 mg into the muscle as needed for anaphylaxis. 04/12/21  Yes Eudelia Maude SAUNDERS, PA-C  famotidine  (PEPCID ) 20 MG tablet Take 20 mg by mouth 2 (two) times daily.   Yes [provider]  ferrous sulfate  325 (65 FE) MG tablet Take 1 tablet (325 mg total) by mouth daily with breakfast. 01/29/23  Yes Cheryle Page, MD  fluticasone  (FLONASE ) 50 MCG/ACT nasal spray Place 2 sprays into both nostrils daily as needed for allergies or rhinitis.   Yes [provider]  gabapentin  (NEURONTIN ) 600 MG tablet Take 600 mg by mouth 3 (three) times daily.   Yes [provider]  hydrocortisone  (ANUSOL -HC) 2.5 % rectal cream Place 1 Application rectally daily as needed for hemorrhoids. 07/29/18  Yes [provider]  HYDROmorphone  (DILAUDID ) 4 MG tablet Take 4 mg by mouth every 6 (six) hours as needed. 09/17/21  Yes [provider]  linaclotide  (LINZESS ) 290 MCG CAPS capsule Take 290 mcg by mouth daily before breakfast.   Yes [provider]  magnesium  oxide (MAG-OX) 400 (240 Mg) MG tablet Take 400 mg by mouth daily. 12/19/22  Yes [provider]  meclizine  (ANTIVERT ) 25 MG tablet Take 25 mg by mouth 3 (three) times daily. 04/15/22  Yes [provider]  mesalamine  (LIALDA ) 1.2 g EC tablet Take 2.4 g by mouth 2 (two) times daily.   Yes [provider]  midodrine  (PROAMATINE ) 5 MG tablet Take 15 mg by mouth 3 (three) times daily with meals.   Yes [provider]  Multiple Vitamin (MULTIVITAMIN WITH MINERALS) TABS tablet Take 1 tablet by mouth daily.   Yes [provider]  NARCAN  4 MG/0.1ML LIQD nasal spray kit Place 1 spray into the nose daily as needed (overdose). 03/18/20  Yes [provider]  Nutritional Supplements (TWOCAL HN 2.0) LIQD 711 mLs by Per J Tube route at bedtime.   Yes [provider]  nystatin  (MYCOSTATIN ) 100000 UNIT/ML suspension Take 5 mLs by mouth 4 (four) times daily. 06/19/24  Yes [provider]  ondansetron  (ZOFRAN -ODT) 8 MG disintegrating tablet Take 8 mg by mouth 3 (three) times daily. 06/13/24  Yes [provider]  oxybutynin  (DITROPAN ) 5 MG tablet Take 10 mg by mouth at bedtime.   Yes [provider]  QULIPTA  30 MG TABS TAKE 1 TABLET BY MOUTH EVERY DAY 12/27/23  Yes Gayland Lauraine PARAS, NP  sucralfate  (CARAFATE ) 1 g tablet Take 1 tablet (1 g total) by mouth every 6 (six) hours. Patient taking differently: Take 1 g by mouth 4 (four) times daily. 01/21/23  Yes Caleen Qualia, MD  SUMAtriptan  (IMITREX ) 50 MG tablet TAKE 1 TABLET BY  MOUTH EVERY 2 HOURS AS NEEDED FOR MIGRAINE, MAY REPEAT IN 2 HOURS IF HEADACHE PERSISTS OR REOCCURS 03/21/24  Yes Gayland Lauraine PARAS, NP  SYMPROIC  0.2 MG TABS Take 0.2 mg by mouth daily. 05/13/21  Yes [provider]  thiamine  (VITAMIN B-1) 100 MG tablet Take 100 mg by mouth daily at 12 noon.    Yes [provider]  torsemide  (DEMADEX ) 5 MG tablet Take 5 mg by mouth daily. 11/10/21  Yes [provider]  valACYclovir  (VALTREX ) 1000 MG tablet Take 1,000 mg by mouth 2 (two) times daily as needed.   Yes [provider]    Physical Exam: Constitutional: Moderately built and nourished. Vitals:   08/09/24 1721 08/09/24 1915  BP: (!) 125/95 (!) 136/90  Pulse: 86 73  Resp: 18 18  Temp: 98.6 F (37 C)   TempSrc: Oral   SpO2: 100% 94%   Eyes: Anicteric no pallor. ENMT: No discharge from the ears eyes nose or mouth. Neck: No mass felt.  No neck rigidity. Respiratory: No rhonchi or crepitations. Cardiovascular: S1-S2 heard. Abdomen: Soft nontender bowel sound present. Musculoskeletal: No edema. Skin: No rash. Neurologic: Alert awake oriented time  place and person.  Moves all extremities. Psychiatric: Appears normal.  Normal affect.   Labs on Admission: I have personally reviewed following labs and imaging studies  CBC: Recent Labs  Lab 08/09/24 1735  WBC 12.7*  HGB 13.8  HCT 41.4  MCV 92.6  PLT 322   Basic Metabolic Panel: Recent Labs  Lab 08/09/24 1735  NA 139  K 3.7  CL 102  CO2 28  GLUCOSE 126*  BUN 15  CREATININE 0.61  CALCIUM  9.9   GFR: CrCl cannot be calculated (Unknown ideal weight.). Liver Function Tests: Recent Labs  Lab 08/09/24 1735  AST 14*  ALT 6  ALKPHOS 68  BILITOT 0.4  PROT 7.1  ALBUMIN 4.1   Recent Labs  Lab 08/09/24 1735  LIPASE 45   No results for input(s): AMMONIA in the last 168 hours. Coagulation Profile: No results for input(s): INR, PROTIME in the last 168 hours. Cardiac Enzymes: No results for  input(s): CKTOTAL, CKMB, CKMBINDEX, TROPONINI in the last 168 hours. BNP (last 3 results) No results for input(s): PROBNP in the last 8760 hours. HbA1C: No results for input(s): HGBA1C in the last 72 hours. CBG: Recent Labs  Lab 08/09/24 1858  GLUCAP 120*   Lipid Profile: No results for input(s): CHOL, HDL, LDLCALC, TRIG, CHOLHDL, LDLDIRECT in the last 72 hours. Thyroid  Function Tests: No results for input(s): TSH, T4TOTAL, FREET4, T3FREE, THYROIDAB in the last 72 hours. Anemia Panel: No results for input(s): VITAMINB12, FOLATE, FERRITIN, TIBC, IRON, RETICCTPCT in the last 72 hours. Urine analysis:    Component Value Date/Time   COLORURINE AMBER (A) 07/25/2023 0315   APPEARANCEUR CLOUDY (A) 07/25/2023 0315   APPEARANCEUR Cloudy (A) 12/15/2017 1722   LABSPEC 1.036 (H) 07/25/2023 0315   PHURINE 5.0 07/25/2023 0315   GLUCOSEU NEGATIVE 07/25/2023 0315   HGBUR NEGATIVE 07/25/2023 0315   BILIRUBINUR SMALL (A) 07/25/2023 0315   BILIRUBINUR negative 01/05/2020 1434   BILIRUBINUR Negative 12/15/2017 1722   KETONESUR 5 (A) 07/25/2023 0315   PROTEINUR 100 (A) 07/25/2023 0315   UROBILINOGEN 0.2 01/05/2020 1434   UROBILINOGEN 0.2 11/29/2018 1742   NITRITE NEGATIVE 07/25/2023 0315   LEUKOCYTESUR SMALL (A) 07/25/2023 0315   Sepsis Labs: @LABRCNTIP (procalcitonin:4,lacticidven:4) )No results found for this or any previous visit (from the past 240 hours).   Radiological Exams on Admission: CT ABDOMEN PELVIS W CONTRAST Result Date: 08/09/2024 EXAM: CT ABDOMEN AND PELVIS WITH CONTRAST 08/09/2024 08:25:04 PM TECHNIQUE: CT of the abdomen and pelvis was performed with the administration of 100 mL of iohexol  (OMNIPAQUE ) 300 MG/ML solution. Multiplanar reformatted images are provided for review. Automated exposure control, iterative reconstruction, and/or weight-based adjustment of the mA/kV was utilized to reduce the radiation dose to as low as  reasonably achievable. COMPARISON: 10/01/2023 CLINICAL HISTORY: Abdominal pain, acute, nonlocalized; hematemesis, abd pain, hx of UC and stomach ulcer. FINDINGS: LOWER CHEST: No acute abnormality. LIVER: Subcentimeter right hepatic cyst, benign. GALLBLADDER AND BILE DUCTS: Gallbladder is unremarkable. No biliary ductal dilatation. SPLEEN: 2.3 cm splenic cyst unchanged, benign. PANCREAS: No acute abnormality. ADRENAL GLANDS: No acute abnormality. KIDNEYS, URETERS AND BLADDER: No stones in the kidneys or ureters. No hydronephrosis. No perinephric or periureteral stranding. Urinary bladder is unremarkable. GI AND BOWEL: Status post gastric bypass with patent gastrojejunostomy. Indwelling percutaneous gastrostomy within the bypassed gastric antrum. Moderate colonic stool burden, suggesting mild constipation. Short segment small bowel intussusception in the left hemiabdomen, typically a transient finding. PERITONEUM AND RETROPERITONEUM: Small volume free fluid in the pelvis. No free air. VASCULATURE:  Aorta is normal in caliber. LYMPH NODES: No lymphadenopathy. REPRODUCTIVE ORGANS: Status post hysterectomy. BONES AND SOFT TISSUES: No acute osseous abnormality. No focal soft tissue abnormality. IMPRESSION: 1. Status post gastric bypass with patent gastrojejunostomy. No evidence of bowel obstruction. 2. Indwelling percutaneous gastrostomy within the bypassed gastric antrum. 3. Additional ancillary findings, as above. Electronically signed by: Pinkie Pebbles MD 08/09/2024 08:32 PM EST RP Workstation: [id number]   DG ABD ACUTE 2+V W 1V CHEST Result Date: 08/09/2024 CLINICAL DATA:  Abdomen pain vomiting blood EXAM: DG ABDOMEN ACUTE WITH 1 VIEW CHEST COMPARISON:  02/19/2024, chest x-ray 08/21/2019 FINDINGS: Single-view chest demonstrates no focal opacity or pleural effusion. Normal cardiomediastinal silhouette. No pneumothorax. Supine and upright views of the abdomen are obtained. Postsurgical changes in the left upper  quadrant. Possible dilated segment of small bowel measuring up to 3.3 cm in the left upper quadrant. Scattered bowel gas elsewhere. Catheter in the right upper quadrant with tip projecting in the mid abdominal region. 11 mm metallic screw projecting over the right mid quadrant possibly in the colon. Sacral stimulator device is noted. IMPRESSION: 1. No acute cardiopulmonary disease. 2. Possible dilated segment of small bowel in the left upper quadrant, nonspecific but could be due to focal ileus or partial obstruction. 3. 11 mm metallic screw projecting over the right mid quadrant possibly in the colon. Correlate for any history of foreign body ingestion 4. Catheter or possible enteric tube overlying the right upper quadrant, the tip is seen to the right of the L2 vertebral body Electronically Signed   By: Luke Bun M.D.   On: 08/09/2024 20:14     Assessment/Plan Principal Problem:   Nausea & vomiting Active Problems:   S/P laparoscopic sleeve gastrectomy   MDD (major depressive disorder), recurrent severe, without psychosis (HCC)   Chronic pain    Intractable nausea vomiting with history of prior gastric bypass surgery with CT scan showing features concerning for possible transient intussusception.  General surgery has been consulted.  Patient NPO.  IV fluids.  Antiemetics. Hematemesis with history of gastrojejunostomy anastomotic site ulcer per EGD done in January 2025.  Will keep patient on IV Protonix  consult GI.  Follow CBC. Chronic pain takes Dilaudid  as Belbuca  unable to take because of the vomiting.  Will keep patient on IV Dilaudid  for now. History of peripheral neuropathy takes gabapentin  presently NPO. History of anxiety and depression on Wellbutrin  BuSpar  and Klonopin . History of migraines takes Atogepant . History of B12 deficiency takes B12 shots every month.  Patient has intractable nausea vomiting with hematemesis will need close monitoring further workup and more than 2  midnight stay.   DVT prophylaxis: SCDs. Code Status: Full code. Family Communication: Discussed with patient. Disposition Plan: Medical floor. Consults called: General Surgery and gastroenterology. Admission status: Inpatient.

## 2024-08-10 DIAGNOSIS — G894 Chronic pain syndrome: Secondary | ICD-10-CM

## 2024-08-10 DIAGNOSIS — F418 Other specified anxiety disorders: Secondary | ICD-10-CM | POA: Diagnosis not present

## 2024-08-10 DIAGNOSIS — Z87891 Personal history of nicotine dependence: Secondary | ICD-10-CM | POA: Diagnosis not present

## 2024-08-10 DIAGNOSIS — Z880 Allergy status to penicillin: Secondary | ICD-10-CM | POA: Diagnosis not present

## 2024-08-10 DIAGNOSIS — R109 Unspecified abdominal pain: Secondary | ICD-10-CM | POA: Diagnosis not present

## 2024-08-10 DIAGNOSIS — J45909 Unspecified asthma, uncomplicated: Secondary | ICD-10-CM | POA: Diagnosis not present

## 2024-08-10 DIAGNOSIS — F419 Anxiety disorder, unspecified: Secondary | ICD-10-CM | POA: Diagnosis present

## 2024-08-10 DIAGNOSIS — K9589 Other complications of other bariatric procedure: Secondary | ICD-10-CM | POA: Diagnosis present

## 2024-08-10 DIAGNOSIS — E162 Hypoglycemia, unspecified: Secondary | ICD-10-CM | POA: Diagnosis not present

## 2024-08-10 DIAGNOSIS — K219 Gastro-esophageal reflux disease without esophagitis: Secondary | ICD-10-CM | POA: Diagnosis present

## 2024-08-10 DIAGNOSIS — D509 Iron deficiency anemia, unspecified: Secondary | ICD-10-CM | POA: Diagnosis present

## 2024-08-10 DIAGNOSIS — K920 Hematemesis: Secondary | ICD-10-CM | POA: Insufficient documentation

## 2024-08-10 DIAGNOSIS — Z825 Family history of asthma and other chronic lower respiratory diseases: Secondary | ICD-10-CM | POA: Diagnosis not present

## 2024-08-10 DIAGNOSIS — E440 Moderate protein-calorie malnutrition: Secondary | ICD-10-CM | POA: Diagnosis present

## 2024-08-10 DIAGNOSIS — K289 Gastrojejunal ulcer, unspecified as acute or chronic, without hemorrhage or perforation: Secondary | ICD-10-CM | POA: Diagnosis present

## 2024-08-10 DIAGNOSIS — K9423 Gastrostomy malfunction: Secondary | ICD-10-CM | POA: Diagnosis present

## 2024-08-10 DIAGNOSIS — Z23 Encounter for immunization: Secondary | ICD-10-CM | POA: Diagnosis present

## 2024-08-10 DIAGNOSIS — F332 Major depressive disorder, recurrent severe without psychotic features: Secondary | ICD-10-CM | POA: Diagnosis present

## 2024-08-10 DIAGNOSIS — Z885 Allergy status to narcotic agent status: Secondary | ICD-10-CM | POA: Diagnosis not present

## 2024-08-10 DIAGNOSIS — Z79899 Other long term (current) drug therapy: Secondary | ICD-10-CM | POA: Diagnosis not present

## 2024-08-10 DIAGNOSIS — Z1152 Encounter for screening for COVID-19: Secondary | ICD-10-CM | POA: Diagnosis not present

## 2024-08-10 DIAGNOSIS — R509 Fever, unspecified: Secondary | ICD-10-CM | POA: Diagnosis not present

## 2024-08-10 DIAGNOSIS — Z9884 Bariatric surgery status: Secondary | ICD-10-CM | POA: Diagnosis not present

## 2024-08-10 DIAGNOSIS — Z8249 Family history of ischemic heart disease and other diseases of the circulatory system: Secondary | ICD-10-CM | POA: Diagnosis not present

## 2024-08-10 DIAGNOSIS — G629 Polyneuropathy, unspecified: Secondary | ICD-10-CM | POA: Diagnosis present

## 2024-08-10 DIAGNOSIS — R112 Nausea with vomiting, unspecified: Secondary | ICD-10-CM | POA: Diagnosis present

## 2024-08-10 DIAGNOSIS — K254 Chronic or unspecified gastric ulcer with hemorrhage: Secondary | ICD-10-CM | POA: Diagnosis present

## 2024-08-10 DIAGNOSIS — Z8719 Personal history of other diseases of the digestive system: Secondary | ICD-10-CM | POA: Diagnosis not present

## 2024-08-10 DIAGNOSIS — Z881 Allergy status to other antibiotic agents status: Secondary | ICD-10-CM | POA: Diagnosis not present

## 2024-08-10 DIAGNOSIS — Z96653 Presence of artificial knee joint, bilateral: Secondary | ICD-10-CM | POA: Diagnosis present

## 2024-08-10 DIAGNOSIS — E538 Deficiency of other specified B group vitamins: Secondary | ICD-10-CM | POA: Diagnosis present

## 2024-08-10 DIAGNOSIS — G8929 Other chronic pain: Secondary | ICD-10-CM | POA: Diagnosis present

## 2024-08-10 DIAGNOSIS — Y832 Surgical operation with anastomosis, bypass or graft as the cause of abnormal reaction of the patient, or of later complication, without mention of misadventure at the time of the procedure: Secondary | ICD-10-CM | POA: Diagnosis present

## 2024-08-10 LAB — COMPREHENSIVE METABOLIC PANEL WITH GFR
ALT: <5 U/L (ref 0–44)
AST: 11 U/L — ABNORMAL LOW (ref 15–41)
Albumin: 3.2 g/dL — ABNORMAL LOW (ref 3.5–5.0)
Alkaline Phosphatase: 51 U/L (ref 38–126)
Anion gap: 6 (ref 5–15)
BUN: 18 mg/dL (ref 6–20)
CO2: 28 mmol/L (ref 22–32)
Calcium: 8.9 mg/dL (ref 8.9–10.3)
Chloride: 106 mmol/L (ref 98–111)
Creatinine, Ser: 0.58 mg/dL (ref 0.44–1.00)
GFR, Estimated: >60 mL/min (ref >60)
Glucose, Bld: 111 mg/dL — ABNORMAL HIGH (ref 70–99)
Potassium: 3.8 mmol/L (ref 3.5–5.1)
Sodium: 140 mmol/L (ref 135–145)
Total Bilirubin: 0.3 mg/dL (ref 0.0–1.2)
Total Protein: 5.6 g/dL — ABNORMAL LOW (ref 6.5–8.1)

## 2024-08-10 LAB — CBC
HCT: 40 % (ref 36.0–46.0)
HCT: 35.9 % — ABNORMAL LOW (ref 36.0–46.0)
HCT: 31.3 % — ABNORMAL LOW (ref 36.0–46.0)
Hemoglobin: 13.4 g/dL (ref 12.0–15.0)
Hemoglobin: 11.6 g/dL — ABNORMAL LOW (ref 12.0–15.0)
Hemoglobin: 10.2 g/dL — ABNORMAL LOW (ref 12.0–15.0)
MCH: 31.2 pg (ref 26.0–34.0)
MCH: 30.6 pg (ref 26.0–34.0)
MCH: 30.5 pg (ref 26.0–34.0)
MCHC: 33.5 g/dL (ref 30.0–36.0)
MCHC: 32.3 g/dL (ref 30.0–36.0)
MCHC: 32.6 g/dL (ref 30.0–36.0)
MCV: 93 fL (ref 80.0–100.0)
MCV: 94.7 fL (ref 80.0–100.0)
MCV: 93.7 fL (ref 80.0–100.0)
Platelets: 306 K/uL (ref 150–400)
Platelets: 276 K/uL (ref 150–400)
Platelets: 257 K/uL (ref 150–400)
RBC: 4.3 MIL/uL (ref 3.87–5.11)
RBC: 3.79 MIL/uL — ABNORMAL LOW (ref 3.87–5.11)
RBC: 3.34 MIL/uL — ABNORMAL LOW (ref 3.87–5.11)
RDW: 14.4 % (ref 11.5–15.5)
RDW: 14.5 % (ref 11.5–15.5)
RDW: 14.6 % (ref 11.5–15.5)
WBC: 13.8 K/uL — ABNORMAL HIGH (ref 4.0–10.5)
WBC: 13 K/uL — ABNORMAL HIGH (ref 4.0–10.5)
WBC: 11.6 K/uL — ABNORMAL HIGH (ref 4.0–10.5)
nRBC: 0 % (ref 0.0–0.2)
nRBC: 0 % (ref 0.0–0.2)
nRBC: 0 % (ref 0.0–0.2)

## 2024-08-10 LAB — URINALYSIS, ROUTINE W REFLEX MICROSCOPIC
Bilirubin Urine: NEGATIVE
Glucose, UA: NEGATIVE mg/dL
Hgb urine dipstick: NEGATIVE
Ketones, ur: 20 mg/dL — AB
Leukocytes,Ua: NEGATIVE
Nitrite: NEGATIVE
Protein, ur: NEGATIVE mg/dL
Specific Gravity, Urine: 1.01 (ref 1.005–1.030)
pH: 6 (ref 5.0–8.0)

## 2024-08-10 LAB — OCCULT BLOOD, GASTRIC FLUID (SPECIMEN CUP): Occult Blood, Gastric Fluid: NEGATIVE

## 2024-08-10 LAB — HIV ANTIBODY (ROUTINE TESTING W REFLEX): HIV Screen 4th Generation wRfx: NONREACTIVE

## 2024-08-10 MED ORDER — CLONAZEPAM 1 MG PO TABS
1.0000 mg | ORAL_TABLET | Freq: Two times a day (BID) | ORAL | Status: DC | PRN
  Administered 2024-08-12 – 2024-08-15 (×4): 1 mg via ORAL
  Filled 2024-08-10 (×5): qty 1

## 2024-08-10 MED ORDER — BUPROPION HCL ER (XL) 300 MG PO TB24
450.0000 mg | ORAL_TABLET | Freq: Every day | ORAL | Status: DC
  Administered 2024-08-11 – 2024-08-17 (×7): 450 mg via ORAL
  Filled 2024-08-10 (×7): qty 1

## 2024-08-10 MED ORDER — METHOCARBAMOL 1000 MG/10ML IJ SOLN
500.0000 mg | Freq: Three times a day (TID) | INTRAMUSCULAR | Status: DC | PRN
  Administered 2024-08-10 – 2024-08-15 (×11): 500 mg via INTRAVENOUS
  Filled 2024-08-10 (×10): qty 10

## 2024-08-10 MED ORDER — BUSPIRONE HCL 5 MG PO TABS
15.0000 mg | ORAL_TABLET | Freq: Three times a day (TID) | ORAL | Status: DC
  Administered 2024-08-10 – 2024-08-17 (×21): 15 mg via ORAL
  Filled 2024-08-10 (×21): qty 1

## 2024-08-10 MED ORDER — DULOXETINE HCL 20 MG PO CPEP
60.0000 mg | ORAL_CAPSULE | Freq: Two times a day (BID) | ORAL | Status: DC
  Administered 2024-08-11 – 2024-08-17 (×13): 60 mg via ORAL
  Filled 2024-08-10 (×13): qty 3

## 2024-08-10 MED ORDER — HYDROMORPHONE HCL 1 MG/ML IJ SOLN
1.0000 mg | INTRAMUSCULAR | Status: DC | PRN
Start: 2024-08-10 — End: 2024-08-10
  Administered 2024-08-10 (×4): 1 mg via INTRAVENOUS
  Filled 2024-08-10 (×4): qty 1

## 2024-08-10 MED ORDER — HYDROMORPHONE HCL 1 MG/ML IJ SOLN
1.5000 mg | INTRAMUSCULAR | Status: AC | PRN
Start: 2024-08-10 — End: 2024-08-11
  Administered 2024-08-10 – 2024-08-11 (×5): 1.5 mg via INTRAVENOUS
  Filled 2024-08-10 (×5): qty 1.5

## 2024-08-10 MED ORDER — BUPROPION HCL ER (XL) 300 MG PO TB24: 300.0000 mg | ORAL_TABLET | Freq: Every morning | ORAL | Status: DC

## 2024-08-10 MED ORDER — FLUTICASONE PROPIONATE 50 MCG/ACT NA SUSP: 2.0000 | Freq: Every day | NASAL | Status: DC | PRN

## 2024-08-10 MED ORDER — SODIUM CHLORIDE 0.9 % IV SOLN
12.5000 mg | Freq: Four times a day (QID) | INTRAVENOUS | Status: DC | PRN
  Administered 2024-08-10 – 2024-08-16 (×11): 12.5 mg via INTRAVENOUS
  Filled 2024-08-10 (×8): qty 12.5
  Filled 2024-08-10: qty 0.5
  Filled 2024-08-10: qty 12.5
  Filled 2024-08-10: qty 0.5
  Filled 2024-08-10: qty 12.5

## 2024-08-10 MED ORDER — GABAPENTIN 300 MG PO CAPS
600.0000 mg | ORAL_CAPSULE | Freq: Three times a day (TID) | ORAL | Status: DC
  Administered 2024-08-10 – 2024-08-17 (×21): 600 mg via ORAL
  Filled 2024-08-10 (×21): qty 2

## 2024-08-10 MED ORDER — BUPROPION HCL ER (SR) 150 MG PO TB12: 150.0000 mg | ORAL_TABLET | Freq: Two times a day (BID) | ORAL | Status: DC

## 2024-08-10 NOTE — Consult Note (Signed)
 Consult Note  [name] 1976/04/27  [id number].    Requesting Provider: Lonni Camp, PA-C and Dr. Burnard Cunning, DO  Chief Complaint/Reason for Consult:  Abdominal pain with vomiting  HPI:  [name] is 48 year old female with a PMH of ulcerative colitis, carpal tunnel syndrome, peripheral neuropathy, osteoarthritis, left sided chronic colitis, hemorrhoids, iron deficiency anemia, UTIS, hiatal hernia, GERD, depression, C.diff, depression, chronic headache disorder, asthma, and anxiety.   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  Other surgical history includes abdominal hysterectomy, laparoscopic tubal ligation, laparoscopic insertion of gastrostomy tube, laparoscopic revision of gastrojejunostomy, hemorrhoidectomy with banding, bilateral total knee arthroplasty, bilateral foot surgeries, placement of bladder stimulator implant.  Patient is known to [facility] - Dr. Deward Foy. She is also patient of Dr. Lucille at [facility]. Her last EGD was in June at [facility] and showed marginal ulcer.   Patient presented to the ED due to abdominal pain and episodes of vomiting. Patient states that she started began to have pain and episode of vomiting around 3-4 days ago. Her pain is present on either side of her abdomen near the feeding tube. Patient states that her last BM was yesterday. Denies hematochezia. She has tried to take in liquids but states that she vomited.  Her work up in the ED included labs and imaging. General surgery has been consulted for further evaluation.   Tobacco use: States that she is a former smoker. Quit 3 weeks ago Alcohol use: Denies Illicit Drug use: Denies Allergies: Confirmed that these were up to date in allergy section of patient chart Anticoagulation medications: Denies   ROS: Per HPI  Family History  Problem Relation Age of Onset    Hypertension Mother    Diabetes Mother    Sarcoidosis Mother        lungs and skin   Asthma Mother    Hypertension Father    Diabetes Father    Asthma Son    Tics Son    Asthma Sister    Cancer Sister        possible pancreatic cancer   Adrenal disorder Sister        Tumor    Asthma Brother    Asthma Daughter        died age 27.5   Cancer Daughter 4       brain; died age 27.5   Asthma Son    Cancer Paternal Aunt        brain, colon, lung and esophagus; unsure of primary    Breast cancer Paternal Aunt     Past Medical History:  Diagnosis Date   Anal fissure - posterior 10/16/2014    OCC  ISSUES   Anxiety    doesn't take anything   Asthma    has Albuterol  inhaler as needed   Bronchitis    in winter   Chronic headache disorder 07/22/2016   Clostridium difficile infection 04/20/2012   Dehydration    Depression    Family history of adverse reaction to anesthesia    pt mom gets sick   GERD (gastroesophageal reflux disease)    takes Pantoprazole  daily   Headache    Hiatal hernia    neuropathy - mild in arms and legs   History of blood transfusion    last transfusion was 04/04/2016=Benadryl  was given d/t itching. States she always itches with transfusion.    History of  migraine    last one 05/01/16   History of stomach ulcers    History of urinary tract infection LAST 2 WEEKS AGO   IDA (iron deficiency anemia)    Internal and external bleeding hemorrhoids 06/11/2014   Joint pain    Joint swelling    Left sided chronic colitis - segmental 06/11/2014   Marginal ulcer 10/25/2017   Motion sickness    Nausea    takes Zofran  as needed   Nausea and vomiting    for 1 year   Oligouria    Osteoarthritis    lower back, knees, wrists - no meds   Peripheral neuropathy 03/01/2019   Personal history of gastric bypass    Postoperative nausea and vomiting 01/21/2016   wants scopolamine  patch   Right carpal tunnel syndrome 03/01/2019   SVD (spontaneous vaginal delivery)    x 4    TOBACCO USER 10/02/2009   Qualifier: Diagnosis of  By: Lida MD, Artist     Tremor 08/31/2018   UC (ulcerative colitis) (HCC)    supposed to be taking Lialda  and Bentyl  but has been off since gastric sleeve   Unsteady gait when walking 2024   uses a walker   Vertigo    doesn't take any meds    Past Surgical History:  Procedure Laterality Date   ABDOMINAL HYSTERECTOMY     PARTIAL   BIOPSY  01/20/2023   Procedure: BIOPSY;  Surgeon: Saintclair Jasper, MD;  Location: [facility];  Service: Gastroenterology;;   BLADDER STIMULATOR IMPLANT     COLONOSCOPY  2007   for rectal bleeding; [facility]   COLONOSCOPY N/A 06/11/2014   Procedure: COLONOSCOPY;  Surgeon: Lupita FORBES Commander, MD;  [facility]: [facility];  Service: Endoscopy;  Laterality: N/A;   COLONOSCOPY WITH PROPOFOL  N/A 03/02/2017   Procedure: COLONOSCOPY WITH PROPOFOL ;  Surgeon: Ethyl Lenis, MD;  Location: [facility];  Service: General;  Laterality: N/A;   DILATION AND CURETTAGE OF UTERUS     ESOPHAGOGASTRODUODENOSCOPY N/A 07/25/2023   Procedure: ESOPHAGOGASTRODUODENOSCOPY (EGD);  Surgeon: Kriss Estefana DEL, DO;  Location: [facility];  Service: Gastroenterology;  Laterality: N/A;   ESOPHAGOGASTRODUODENOSCOPY N/A 10/02/2023   Procedure: ESOPHAGOGASTRODUODENOSCOPY (EGD);  Surgeon: Burnette Fallow, MD;  Location: [facility];  Service: Gastroenterology;  Laterality: N/A;   ESOPHAGOGASTRODUODENOSCOPY (EGD) WITH PROPOFOL  N/A 04/06/2016   Procedure: ESOPHAGOGASTRODUODENOSCOPY (EGD) WITH PROPOFOL ;  Surgeon: Lenis Ethyl, MD;  [facility];  Service: General;  Laterality: N/A;   ESOPHAGOGASTRODUODENOSCOPY (EGD) WITH PROPOFOL  N/A 03/02/2017   Procedure: ESOPHAGOGASTRODUODENOSCOPY (EGD) WITH PROPOFOL ;  Surgeon: Ethyl Lenis, MD;  Location: [facility];  Service: General;  Laterality: N/A;   ESOPHAGOGASTRODUODENOSCOPY (EGD) WITH PROPOFOL  N/A 05/20/2017   Procedure: ESOPHAGOGASTRODUODENOSCOPY (EGD) WITH PROPOFOL  ERAS PATHWAY;  Surgeon:  Ethyl Lenis, MD;  Location: [facility];  Service: General;  Laterality: N/A;   ESOPHAGOGASTRODUODENOSCOPY (EGD) WITH PROPOFOL  N/A 10/07/2017   Procedure: ESOPHAGOGASTRODUODENOSCOPY (EGD) WITH PROPOFOL ;  Surgeon: Ethyl Lenis, MD;  Location: [facility];  Service: General;  Laterality: N/A;   ESOPHAGOGASTRODUODENOSCOPY (EGD) WITH PROPOFOL  N/A 01/20/2023   Procedure: ESOPHAGOGASTRODUODENOSCOPY (EGD) WITH PROPOFOL ;  Surgeon: Saintclair Jasper, MD;  [facility];  Service: Gastroenterology;  Laterality: N/A;   EXCISION OF SKIN TAG  06/08/2017   Procedure: EXCISION OF VULVAR SKIN TAGS X2;  Surgeon: Fredirick Glenys RAMAN, MD;  [facility]: [facility];  Service: Gynecology;;   FOOT SURGERY Bilateral    x 2   GASTRIC ROUX-EN-Y N/A 12/14/2016   Procedure: LAPAROSCOPIC REVISION SLEEVE GASTRECTOMY TO  ROUX-Y-GASTRIC BY-PASS, UPPER  ENDO;  Surgeon: Morene Olives, MD;  [facility];  Service: General;  Laterality: N/A;   GASTROJEJUNOSTOMY N/A 05/11/2016   Procedure: LAPAROSCOPIC PLACEMENT  OF FEEDING  JEJUNOSTOMY TUBE;  Surgeon: Morene Olives, MD;  [facility];  Service: General;  Laterality: N/A;   HEMORRHOIDECTOMY WITH HEMORRHOID BANDING     HEMOSTASIS CLIP PLACEMENT  07/25/2023   Procedure: HEMOSTASIS CLIP PLACEMENT;  Surgeon: Kriss Estefana DEL, DO;  [facility];  Service: Gastroenterology;;   HOT HEMOSTASIS N/A 07/25/2023   Procedure: HOT HEMOSTASIS (ARGON PLASMA COAGULATION/BICAP);  Surgeon: Kriss Estefana DEL, DO;  Location: [facility];  Service: Gastroenterology;  Laterality: N/A;   IR FLUORO GUIDE CV LINE RIGHT  04/06/2017   IR GASTROSTOMY TUBE MOD SED  08/25/2023   IR GENERIC HISTORICAL  05/19/2016   IR CM INJ ANY COLONIC TUBE W/FLUORO 05/19/2016 Marcey Moan, MD [facility]   IR PATIENT EVAL TECH 0-60 MINS  11/30/2017   IR REPLC DUODEN/JEJUNO TUBE PERCUT W/FLUORO  03/14/2018   IR REPLC DUODEN/JEJUNO TUBE PERCUT W/FLUORO  03/15/2018   IR US  GUIDE VASC ACCESS LEFT   08/25/2023   IR US  GUIDE VASC ACCESS LEFT  08/25/2023   IR US  GUIDE VASC ACCESS RIGHT  04/06/2017   j tube removed march 2018     KNEE ARTHROSCOPY Left 09/06/2014   LAPAROSCOPIC GASTRIC SLEEVE RESECTION N/A 01/14/2016   Procedure: LAPAROSCOPIC GASTRIC SLEEVE RESECTION;  Surgeon: Morene Olives, MD;  [facility];  Service: General;  Laterality: N/A;   LAPAROSCOPIC INSERTION GASTROSTOMY TUBE N/A 09/14/2023   Procedure: LAPAROSCOPIC REMNANT GASTROSTOMY TUBE PLACEMENT;  Surgeon: Lyndel Deward PARAS, MD;  [facility];  Service: General;  Laterality: N/A;   LAPAROSCOPIC REVISION OF GASTROJEJUNOSTOMY N/A 10/25/2017   Procedure: LAPAROSCOPIC REVISION OF GASTROJEJUNOSTOMY AND PARTIAL GASTRECTOMY, WITH PLACEMENT OF FEEDING GASTROSTOMY TUBE;  Surgeon: Olives Morene, MD;  [facility];  Service: General;  Laterality: N/A;   LAPAROSCOPIC TUBAL LIGATION  10/16/2011   Procedure: LAPAROSCOPIC TUBAL LIGATION;  Surgeon: Nena DELENA App, MD;  [facility];  Service: Gynecology;  Laterality: Bilateral;   NOVASURE ABLATION  09/28/2010   mild persistent vaginal bleeding   right knee arthroscopy     05/07/2016   TOTAL KNEE ARTHROPLASTY Left 03/10/2018   Procedure: LEFT TOTAL KNEE ARTHROPLASTY;  Surgeon: Ernie Cough, MD;  [facility];  Service: Orthopedics;  Laterality: Left;  70 mins   TOTAL KNEE ARTHROPLASTY Right 01/23/2020   Procedure: TOTAL KNEE ARTHROPLASTY, BILATERAL TROCHANTERIC INJECTION;  Surgeon: Ernie Cough, MD;  [facility];  Service: Orthopedics;  Laterality: Right;  70 mins for Bilateral Troch injection 1 cc Depo and 2 CC Lidocaine    VAGINAL HYSTERECTOMY Bilateral 06/08/2017   Procedure: HYSTERECTOMY VAGINAL W/ BILATERAL SALPINGECTOMY;  Surgeon: Fredirick Glenys RAMAN, MD;  [facility];  Service: Gynecology;  Laterality: Bilateral;   wisdom teeth extracted      Social History:  reports that she quit smoking about 10 years ago. Her smoking use included  cigarettes. She started smoking about 30 years ago. She has a 10 pack-year smoking history. She has never used smokeless tobacco. She reports that she does not currently use alcohol. She reports that she does not use drugs.  Allergies:  Allergies  Allergen Reactions   Ciprofloxacin  Itching, Rash and Dermatitis    Other Reaction(s): ras, Unknown   Coconut (Cocos Nucifera) Anaphylaxis and Itching   Coconut Oil Anaphylaxis, Itching and Other (See Comments)    Other Reaction(s): Anaphylactic reaction  Doxycycline Nausea And Vomiting and Other (See Comments)    Other Reaction(s): Nausea & vomiting, Unknown   Oxycodone Anaphylaxis    Other Reaction(s): Anaphylactic reaction, Unknown   Oxycodone-Acetaminophen  Anaphylaxis   Penicillamine Hives and Other (See Comments)    Other Reaction(s): Hives, Not available   Trazodone  And Nefazodone Rash   Vistaril  [Hydroxyzine  Hcl] Hives   Morphine      Other Reaction(s): Unknown   Silicone    Wound Dressing Adhesive     Other Reaction(s): Not available   Hydroxyzine      Other Reaction(s): Not available, Rash   Nefazodone     Other Reaction(s): Rash   Penicillin G     Other Reaction(s): Rash, Unknown   Penicillins Rash    Has patient had a PCN reaction causing immediate rash, facial/tongue/throat swelling, SOB or lightheadedness with hypotension: Yes Has patient had a PCN reaction causing severe rash involving mucus membranes or skin necrosis: No Has patient had a PCN reaction that required hospitalization No Has patient had a PCN reaction occurring within the last 10 years: No If all of the above answers are NO, then may proceed with Cephalosporin use.   Tape Rash    And paper tape causes a rash if wearing for a prolong period of time  Other Reaction(s): Unknown   Trazodone      Other Reaction(s): Rash, Unknown    (Not in a hospital admission)   Blood pressure 114/76, pulse 83, temperature 99 F (37.2 C), temperature source Oral, resp.  rate 16, last menstrual period 06/02/2017, SpO2 100%. Physical Exam:  General: Pleasant female who is laying in bed in NAD. HEENT: Head is normocephalic, atraumatic. Sclera are noninjected.  PERRL.  Ears and nose without any masses or lesions.  Mouth is pink and moist Heart: Normal HR during encounter. Lungs: Respiratory effort nonlabored. Abd: Soft, ND. Tenderness to palpation around feeding tube. Feeding tube is located centrally above umbilicus. +BS. No guarding or rebound tenderness. MS: all 4 extremities are symmetrical with no cyanosis, clubbing, or edema. Skin: Warm and dry Psych: A&Ox3 with an appropriate affect.   Results for orders placed or performed during the hospital encounter of 08/09/24 (from the past 48 hours)  Lipase, blood     Status: None   Collection Time: 08/09/24  5:35 PM  Result Value Ref Range   Lipase 45 11 - 51 U/L    Comment: Performed at [facility], [address] [place], [place] [address]  Comprehensive metabolic panel     Status: Abnormal   Collection Time: 08/09/24  5:35 PM  Result Value Ref Range   Sodium 139 135 - 145 mmol/L   Potassium 3.7 3.5 - 5.1 mmol/L   Chloride 102 98 - 111 mmol/L   CO2 28 22 - 32 mmol/L   Glucose, Bld 126 (H) 70 - 99 mg/dL    Comment: Glucose reference range applies only to samples taken after fasting for at least 8 hours.   BUN 15 6 - 20 mg/dL   Creatinine, Ser 9.38 0.44 - 1.00 mg/dL   Calcium  9.9 8.9 - 10.3 mg/dL   Total Protein 7.1 6.5 - 8.1 g/dL   Albumin 4.1 3.5 - 5.0 g/dL   AST 14 (L) 15 - 41 U/L   ALT 6 0 - 44 U/L   Alkaline Phosphatase 68 38 - 126 U/L   Total Bilirubin 0.4 0.0 - 1.2 mg/dL   GFR, Estimated >39 >39 mL/min    Comment: (NOTE) Calculated using  the CKD-EPI Creatinine Equation (2021)    Anion gap 9 5 - 15    Comment: Performed at [facility], [address], [place], [place] [address]  CBC     Status: Abnormal   Collection Time: 08/09/24  5:35 PM  Result  Value Ref Range   WBC 12.7 (H) 4.0 - 10.5 K/uL   RBC 4.47 3.87 - 5.11 MIL/uL   Hemoglobin 13.8 12.0 - 15.0 g/dL   HCT 58.5 63.9 - 53.9 %   MCV 92.6 80.0 - 100.0 fL   MCH 30.9 26.0 - 34.0 pg   MCHC 33.3 30.0 - 36.0 g/dL   RDW 85.5 88.4 - 84.4 %   Platelets 322 150 - 400 K/uL   nRBC 0.0 0.0 - 0.2 %    Comment: Performed at [facility], [address], [place], [place] [address]  POC CBG, ED     Status: Abnormal   Collection Time: 08/09/24  6:58 PM  Result Value Ref Range   Glucose-Capillary 120 (H) 70 - 99 mg/dL    Comment: Glucose reference range applies only to samples taken after fasting for at least 8 hours.   Comment 1 Notify RN   Occult Blood, Gastric Fluid (cup to lab)     Status: None   Collection Time: 08/09/24  7:23 PM  Result Value Ref Range   Occult Blood, Gastric Fluid Negative Negative    Comment: (NOTE) Performed At: [facility] [address] [place], [place] [id number] Jennette Shorter MD Ey:[phone]   POC occult blood, ED     Status: None   Collection Time: 08/09/24  9:02 PM  Result Value Ref Range   Fecal Occult Bld NEGATIVE NEGATIVE  HIV Antibody (routine testing w rflx)     Status: None   Collection Time: 08/10/24  1:04 AM  Result Value Ref Range   HIV Screen 4th Generation wRfx Non Reactive Non Reactive    Comment: Performed at [facility], [address], [place], [place] [address]  CBC     Status: Abnormal   Collection Time: 08/10/24  1:04 AM  Result Value Ref Range   WBC 13.8 (H) 4.0 - 10.5 K/uL   RBC 4.30 3.87 - 5.11 MIL/uL   Hemoglobin 13.4 12.0 - 15.0 g/dL   HCT 59.9 63.9 - 53.9 %   MCV 93.0 80.0 - 100.0 fL   MCH 31.2 26.0 - 34.0 pg   MCHC 33.5 30.0 - 36.0 g/dL   RDW 85.5 88.4 - 84.4 %   Platelets 306 150 - 400 K/uL   nRBC 0.0 0.0 - 0.2 %    Comment: Performed at [facility], [address] [place], [place] [address]  Comprehensive metabolic panel     Status: Abnormal   Collection  Time: 08/10/24  5:02 AM  Result Value Ref Range   Sodium 140 135 - 145 mmol/L   Potassium 3.8 3.5 - 5.1 mmol/L   Chloride 106 98 - 111 mmol/L   CO2 28 22 - 32 mmol/L   Glucose, Bld 111 (H) 70 - 99 mg/dL    Comment: Glucose reference range applies only to samples taken after fasting for at least 8 hours.   BUN 18 6 - 20 mg/dL   Creatinine, Ser 9.41 0.44 - 1.00 mg/dL   Calcium  8.9 8.9 - 10.3 mg/dL   Total Protein 5.6 (L) 6.5 - 8.1 g/dL   Albumin 3.2 (L) 3.5 - 5.0 g/dL  AST 11 (L) 15 - 41 U/L   ALT <5 0 - 44 U/L   Alkaline Phosphatase 51 38 - 126 U/L   Total Bilirubin 0.3 0.0 - 1.2 mg/dL   GFR, Estimated >39 >39 mL/min    Comment: (NOTE) Calculated using the CKD-EPI Creatinine Equation (2021)    Anion gap 6 5 - 15    Comment: Performed at [facility], [address] [place], [place] [address]  CBC     Status: Abnormal   Collection Time: 08/10/24  6:31 AM  Result Value Ref Range   WBC 13.0 (H) 4.0 - 10.5 K/uL   RBC 3.79 (L) 3.87 - 5.11 MIL/uL   Hemoglobin 11.6 (L) 12.0 - 15.0 g/dL   HCT 64.0 (L) 63.9 - 53.9 %   MCV 94.7 80.0 - 100.0 fL   MCH 30.6 26.0 - 34.0 pg   MCHC 32.3 30.0 - 36.0 g/dL   RDW 85.4 88.4 - 84.4 %   Platelets 276 150 - 400 K/uL   nRBC 0.0 0.0 - 0.2 %    Comment: Performed at [facility], [address], [place], [place] [address]  Urinalysis, Routine w reflex microscopic -Urine, Clean Catch     Status: Abnormal   Collection Time: 08/10/24  8:00 AM  Result Value Ref Range   Color, Urine YELLOW YELLOW   APPearance HAZY (A) CLEAR   Specific Gravity, Urine 1.010 1.005 - 1.030   pH 6.0 5.0 - 8.0   Glucose, UA NEGATIVE NEGATIVE mg/dL   Hgb urine dipstick NEGATIVE NEGATIVE   Bilirubin Urine NEGATIVE NEGATIVE   Ketones, ur 20 (A) NEGATIVE mg/dL   Protein, ur NEGATIVE NEGATIVE mg/dL   Nitrite NEGATIVE NEGATIVE   Leukocytes,Ua NEGATIVE NEGATIVE    Comment: Performed at [facility], [address], [place], [place] [address]   CT ABDOMEN PELVIS W CONTRAST Result Date: 08/09/2024 EXAM: CT ABDOMEN AND PELVIS WITH CONTRAST 08/09/2024 08:25:04 PM TECHNIQUE: CT of the abdomen and pelvis was performed with the administration of 100 mL of iohexol  (OMNIPAQUE ) 300 MG/ML solution. Multiplanar reformatted images are provided for review. Automated exposure control, iterative reconstruction, and/or weight-based adjustment of the mA/kV was utilized to reduce the radiation dose to as low as reasonably achievable. COMPARISON: 10/01/2023 CLINICAL HISTORY: Abdominal pain, acute, nonlocalized; hematemesis, abd pain, hx of UC and stomach ulcer. FINDINGS: LOWER CHEST: No acute abnormality. LIVER: Subcentimeter right hepatic cyst, benign. GALLBLADDER AND BILE DUCTS: Gallbladder is unremarkable. No biliary ductal dilatation. SPLEEN: 2.3 cm splenic cyst unchanged, benign. PANCREAS: No acute abnormality. ADRENAL GLANDS: No acute abnormality. KIDNEYS, URETERS AND BLADDER: No stones in the kidneys or ureters. No hydronephrosis. No perinephric or periureteral stranding. Urinary bladder is unremarkable. GI AND BOWEL: Status post gastric bypass with patent gastrojejunostomy. Indwelling percutaneous gastrostomy within the bypassed gastric antrum. Moderate colonic stool burden, suggesting mild constipation. Short segment small bowel intussusception in the left hemiabdomen, typically a transient finding. PERITONEUM AND RETROPERITONEUM: Small volume free fluid in the pelvis. No free air. VASCULATURE: Aorta is normal in caliber. LYMPH NODES: No lymphadenopathy. REPRODUCTIVE ORGANS: Status post hysterectomy. BONES AND SOFT TISSUES: No acute osseous abnormality. No focal soft tissue abnormality. IMPRESSION: 1. Status post gastric bypass with patent gastrojejunostomy. No evidence of bowel obstruction. 2. Indwelling percutaneous gastrostomy within the bypassed gastric antrum. 3. Additional ancillary findings, as above. Electronically signed by:  Pinkie Pebbles MD 08/09/2024 08:32 PM EST RP Workstation: [id number]   DG ABD ACUTE 2+V W 1V CHEST  Result Date: 08/09/2024 CLINICAL DATA:  Abdomen pain vomiting blood EXAM: DG ABDOMEN ACUTE WITH 1 VIEW CHEST COMPARISON:  02/19/2024, chest x-ray 08/21/2019 FINDINGS: Single-view chest demonstrates no focal opacity or pleural effusion. Normal cardiomediastinal silhouette. No pneumothorax. Supine and upright views of the abdomen are obtained. Postsurgical changes in the left upper quadrant. Possible dilated segment of small bowel measuring up to 3.3 cm in the left upper quadrant. Scattered bowel gas elsewhere. Catheter in the right upper quadrant with tip projecting in the mid abdominal region. 11 mm metallic screw projecting over the right mid quadrant possibly in the colon. Sacral stimulator device is noted. IMPRESSION: 1. No acute cardiopulmonary disease. 2. Possible dilated segment of small bowel in the left upper quadrant, nonspecific but could be due to focal ileus or partial obstruction. 3. 11 mm metallic screw projecting over the right mid quadrant possibly in the colon. Correlate for any history of foreign body ingestion 4. Catheter or possible enteric tube overlying the right upper quadrant, the tip is seen to the right of the L2 vertebral body Electronically Signed   By: Luke Bun M.D.   On: 08/09/2024 20:14      Assessment/Plan Abdominal pain Vomiting History of gastric rou-en-y, hx of marginal ulcers, history of malnutrition with gastrostomy tube -Afebrile. -Most recent labs: WBC 13.0 from 12.7 which was 2 days ago, HGB 11.6, total protein 5.6, Albumin 3.2, AST 11, Glucose 111 -DG ABD + Chest showed no acute cardiopulmonary disease. Possible dilated segment of small bowel in the left upper quadrant, nonspecific but could be due to focal ileus or partial obstruction. 11 mm metallic screw projecting over the right mid quadrant possibly in the colon. Correlate for any history of foreign  body ingestion. Catheter or possible enteric tube overlying the right upper quadrant, the tip is seen to the right of the L2 vertebral body. -CT imaging showed status post gastric bypass with patent gastrojejunostomy. No evidence of bowel obstruction. Indwelling percutaneous gastrostomy within the bypassed gastric antrum. -Imaging reviewed with Dr. Teresa and by Dr. Lyndel.  -No emergent surgical intervention advised. It is recommended that patient follow up with Dr. Lucille at [facility] as it seems that they were making plans to revise her bypass.  -Please contact for further questions or concerns.   FEN: Okay for CLD VTE: SCDs ID: None   I reviewed ED provider notes, nursing notes, hospitalist notes, last 24 h vitals and pain scores, last 48 h intake and output, last 24 h labs and trends, and last 24 h imaging results.  This care required high  level of medical decision making.   Marjorie Carlyon Favre, [facility] Surgery 08/10/2024, 1:14 PM Please see Amion for pager number during day hours 7:00am-4:30pm

## 2024-08-10 NOTE — Plan of Care (Signed)
  Problem: Education: Goal: Knowledge of General Education information will improve Description: Including pain rating scale, medication(s)/side effects and non-pharmacologic comfort measures Outcome: Progressing   Problem: Health Behavior/Discharge Planning: Goal: Ability to manage health-related needs will improve Outcome: Progressing   Problem: Clinical Measurements: Goal: Ability to maintain clinical measurements within normal limits will improve Outcome: Progressing   Problem: Nutrition: Goal: Adequate nutrition will be maintained Outcome: Progressing   Problem: Elimination: Goal: Will not experience complications related to bowel motility Outcome: Progressing   Problem: Pain Managment: Goal: General experience of comfort will improve and/or be controlled Outcome: Progressing

## 2024-08-10 NOTE — Progress Notes (Signed)
 Progress Note   Patient: [name] FMW:[id number] DOB: May 08, 1976 DOA: 08/09/2024     0 DOS: the patient was seen and examined on 08/10/2024   Brief hospital course: From H&P: [name] is a 48 y.o. 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  Denies any diarrhea.  Patient states she is on chronic pain relief medication including Dilaudid  and Suboxone which she was not able to take because of the vomiting.  She had some episodes of diarrhea over the last few days but usually she is constipated.  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  Also had a repeat EGD in June 2025 at [facility] results of which are not accessible.   ED Course: In the ER patient had a CT abdomen pelvis shows transient intussusception like symptom features for which general surgery has been consulted.  Labs show WBC of 12.7 hemoglobin 13.8 creatinine 1.6.  Patient started on Protonix  IV given the hematemesis admitted for further workup.  Fecal occult blood was negative.   Pt was admitted on evening of 08/09/2024 for further evaluation and management of intractable nausea/vomiting, abdominal pain and hematemesis as outlined in detail below.  Consults:  Gastroenterology General surgery    Assessment and Plan:  Intractable nausea vomiting  Hematemesis History of prior gastric bypass surgery with CT scan showing features concerning for possible transient intussusception.   --General surgery was consulted and discussed case with patient's surgeon at [facility].  See consult note.  No emergent surgical needs. --Follow up at [facility] as scheduled --Smoking cessation advised --G-tube use for feeding and/or meds okay, flush daily --Gastroenterology consulted -- plan to EGD  tomorrow --Clear liquid diet today, NPO after midnight --Continue IV fluids --IV pain control until tolerating PO --IV antiemetics PRN --IV Protonix  40 mg BID    Chronic pain takes Dilaudid  as Belbuca  unable to take because of the vomiting.   --Continue IV dilaudid , increased to 1.5 mg given home oral dose is 4 mg & pain poorly controlled  History of peripheral neuropathy  --Continue gabapentin  if tolerated (hold if N/V)  History of anxiety and depression  --Continue Wellbutrin  Cymbalta  BuSpar  and Klonopin  if  tolerated (hold if N/V)  History of migraines  --On Atogepant   History of B12 deficiency  --On B12 injections monthly          Subjective: Pt seen in the ED this AM, holding for a bed.  She reports poor pain control on 1 mg IV dilaudid .  Her home oral dilaudid  is 4 mg.  Reports ongoing nausea/vomiting, not yet tolerating oral intake.  Requesting clear liquid diet.  GI plans for EGD tomorrow.   Physical Exam: Vitals:   08/10/24 0900 08/10/24 1147 08/10/24 1249 08/10/24 1357  BP: 113/85  114/76 114/81  Pulse: 97  83 77  Resp: 12  16   Temp:  99 F (37.2 C) 99 F (37.2 C) 99.4 F (37.4 C)  TempSrc:  Oral Oral   SpO2: 100%  100% 100%   General exam: awake, alert, no acute distress, ill appearing HEENT: atraumatic, clear conjunctiva, anicteric sclera, moist mucus membranes, hearing grossly normal  Respiratory system: CTAB, no wheezes, rales or rhonchi, normal respiratory effort. Cardiovascular system: normal S1/S2, RRR, no JVD, murmurs, rubs, gallops, no pedal edema.   Gastrointestinal system:  soft, mild epigastric tenderness without rebound or guarding.  G tube present Central nervous system: A&O x 3. no gross focal neurologic deficits, normal speech Extremities: moves all, no edema, normal tone Skin: dry, intact, normal temperature Psychiatry: normal mood, congruent affect, judgement and insight appear normal   Data Reviewed:  Notable labs --   Hbg  10.2 WBC 11.6  Family Communication: None  Disposition: Status is: Inpatient Remains inpatient appropriate because: ongoing evaluation and requiring IV medications  Planned Discharge Destination: Home    Time spent: 45 minutes  Author: Burnard DELENA Cunning, DO 08/10/2024 4:05 PM  For on call review [email or web address].

## 2024-08-10 NOTE — H&P (View-Only) (Signed)
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 Labs    08/09/24 1735 08/10/24 0502  NA 139 140  K 3.7 3.8  CL 102 106  CO2 28 28  GLUCOSE 126* 111*  BUN 15 18  CREATININE 0.61 0.58  CALCIUM  9.9 8.9   LFT Recent Labs    08/10/24 0502  PROT 5.6*  ALBUMIN 3.2*  AST 11*  ALT <5  ALKPHOS 51  BILITOT 0.3   PT/INR No results for input(s): LABPROT, INR in the last 72 hours.  Studies/Results: CT ABDOMEN PELVIS W CONTRAST Result Date: 08/09/2024 EXAM: CT ABDOMEN AND PELVIS WITH CONTRAST 08/09/2024 08:25:04 PM TECHNIQUE: CT of the abdomen and pelvis was performed with the  administration of 100 mL of iohexol  (OMNIPAQUE ) 300 MG/ML solution. Multiplanar reformatted images are provided for review. Automated exposure control, iterative reconstruction, and/or weight-based adjustment of the mA/kV was utilized to reduce the radiation dose to as low as reasonably achievable. COMPARISON: 10/01/2023 CLINICAL HISTORY: Abdominal pain, acute, nonlocalized; hematemesis, abd pain, hx of UC and stomach ulcer. FINDINGS: LOWER CHEST: No acute abnormality. LIVER: Subcentimeter right hepatic cyst, benign. GALLBLADDER AND BILE DUCTS: Gallbladder is unremarkable. No biliary ductal dilatation. SPLEEN: 2.3 cm splenic cyst unchanged, benign. PANCREAS: No acute abnormality. ADRENAL GLANDS: No acute abnormality. KIDNEYS, URETERS AND BLADDER: No stones in the kidneys or ureters. No hydronephrosis. No perinephric or periureteral stranding. Urinary bladder is unremarkable. GI AND BOWEL: Status post gastric bypass with patent gastrojejunostomy. Indwelling percutaneous gastrostomy within the bypassed gastric antrum. Moderate colonic stool burden, suggesting mild constipation. Short segment small bowel intussusception in the left hemiabdomen, typically a transient finding. PERITONEUM AND RETROPERITONEUM: Small volume free fluid in the pelvis. No  free air. VASCULATURE: Aorta is normal in caliber. LYMPH NODES: No lymphadenopathy. REPRODUCTIVE ORGANS: Status post hysterectomy. BONES AND SOFT TISSUES: No acute osseous abnormality. No focal soft tissue abnormality. IMPRESSION: 1. Status post gastric bypass with patent gastrojejunostomy. No evidence of bowel obstruction. 2. Indwelling percutaneous gastrostomy within the bypassed gastric antrum. 3. Additional ancillary findings, as above. Electronically signed by: Pinkie Pebbles MD 08/09/2024 08:32 PM EST RP Workstation: [id number]   DG ABD ACUTE 2+V W 1V CHEST Result Date: 08/09/2024 CLINICAL DATA:  Abdomen pain vomiting blood EXAM: DG ABDOMEN ACUTE WITH 1 VIEW CHEST  COMPARISON:  02/19/2024, chest x-ray 08/21/2019 FINDINGS: Single-view chest demonstrates no focal opacity or pleural effusion. Normal cardiomediastinal silhouette. No pneumothorax. Supine and upright views of the abdomen are obtained. Postsurgical changes in the left upper quadrant. Possible dilated segment of small bowel measuring up to 3.3 cm in the left upper quadrant. Scattered bowel gas elsewhere. Catheter in the right upper quadrant with tip projecting in the mid abdominal region. 11 mm metallic screw projecting over the right mid quadrant possibly in the colon. Sacral stimulator device is noted. IMPRESSION: 1. No acute cardiopulmonary disease. 2. Possible dilated segment of small bowel in the left upper quadrant, nonspecific but could be due to focal ileus or partial obstruction. 3. 11 mm metallic screw projecting over the right mid quadrant possibly in the colon. Correlate for any history of foreign body ingestion 4. Catheter or possible enteric tube overlying the right upper quadrant, the tip is seen to the right of the L2 vertebral body Electronically Signed   By: Luke Bun M.D.   On: 08/09/2024 20:14    Impression: Hematemesis History of recurrent anastomotic gastrojejunal ulcer Hemoglobin 11.6 from 13.4, normal BUN 18 Mild malnutrition, albumin 3.2, total protein 5.6 CT abdomen and pelvis with contrast: Status post gastric bypass with patent gastrojejunostomy, no evidence of obstruction, indwelling percutaneous gastrostomy within bypass gastric antrum Abdominal x-ray: Possible dilated segment of small bowel in left upper quadrant, nonspecific versus focal ileus or partial obstruction, 11 mm metallic screw projecting over right mid quadrant possibly in colon?  Foreign body ingestion Catheter possible enteric tube over right upper quadrant with tip seen right of L2 vertebra  Plan: Okay to stay on clear liquid diet. Patient currently on D5 lactated Ringer 's at 125 mL/h and on Protonix  40  mg IV every 12 hours. Plan diagnostic EGD in a.m. tomorrow. The risks and the benefits of the procedure were discussed with the patient in details. She understands and verbalizes consent.   LOS: 0 days   Estelita Manas, MD  08/10/2024, 8:45 AM

## 2024-08-10 NOTE — Consult Note (Signed)
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 306 276   BMET Recent Labs    08/09/24 1735 08/10/24 0502  NA 139 140  K 3.7 3.8  CL 102 106  CO2 28 28  GLUCOSE 126* 111*  BUN 15 18  CREATININE 0.61 0.58  CALCIUM  9.9 8.9   LFT Recent Labs    08/10/24 0502  PROT 5.6*  ALBUMIN 3.2*  AST 11*  ALT <5  ALKPHOS 51  BILITOT 0.3   PT/INR No results for input(s): LABPROT, INR in the last 72 hours.  Studies/Results: CT ABDOMEN PELVIS W CONTRAST Result Date: 08/09/2024 EXAM: CT ABDOMEN AND PELVIS WITH CONTRAST 08/09/2024 08:25:04 PM TECHNIQUE: CT of the abdomen and pelvis was performed with the  administration of 100 mL of iohexol  (OMNIPAQUE ) 300 MG/ML solution. Multiplanar reformatted images are provided for review. Automated exposure control, iterative reconstruction, and/or weight-based adjustment of the mA/kV was utilized to reduce the radiation dose to as low as reasonably achievable. COMPARISON: 10/01/2023 CLINICAL HISTORY: Abdominal pain, acute, nonlocalized; hematemesis, abd pain, hx of UC and stomach ulcer. FINDINGS: LOWER CHEST: No acute abnormality. LIVER: Subcentimeter right hepatic cyst, benign. GALLBLADDER AND BILE DUCTS: Gallbladder is unremarkable. No biliary ductal dilatation. SPLEEN: 2.3 cm splenic cyst unchanged, benign. PANCREAS: No acute abnormality. ADRENAL GLANDS: No acute abnormality. KIDNEYS, URETERS AND BLADDER: No stones in the kidneys or ureters. No hydronephrosis. No perinephric or periureteral stranding. Urinary bladder is unremarkable. GI AND BOWEL: Status post gastric bypass with patent gastrojejunostomy. Indwelling percutaneous gastrostomy within the bypassed gastric antrum. Moderate colonic stool burden, suggesting mild constipation. Short segment small bowel intussusception in the left hemiabdomen, typically a transient finding. PERITONEUM AND RETROPERITONEUM: Small volume free fluid in the pelvis. No  free air. VASCULATURE: Aorta is normal in caliber. LYMPH NODES: No lymphadenopathy. REPRODUCTIVE ORGANS: Status post hysterectomy. BONES AND SOFT TISSUES: No acute osseous abnormality. No focal soft tissue abnormality. IMPRESSION: 1. Status post gastric bypass with patent gastrojejunostomy. No evidence of bowel obstruction. 2. Indwelling percutaneous gastrostomy within the bypassed gastric antrum. 3. Additional ancillary findings, as above. Electronically signed by: Pinkie Pebbles MD 08/09/2024 08:32 PM EST RP Workstation: [id number]   DG ABD ACUTE 2+V W 1V CHEST Result Date: 08/09/2024 CLINICAL DATA:  Abdomen pain vomiting blood EXAM: DG ABDOMEN ACUTE WITH 1 VIEW CHEST  COMPARISON:  02/19/2024, chest x-ray 08/21/2019 FINDINGS: Single-view chest demonstrates no focal opacity or pleural effusion. Normal cardiomediastinal silhouette. No pneumothorax. Supine and upright views of the abdomen are obtained. Postsurgical changes in the left upper quadrant. Possible dilated segment of small bowel measuring up to 3.3 cm in the left upper quadrant. Scattered bowel gas elsewhere. Catheter in the right upper quadrant with tip projecting in the mid abdominal region. 11 mm metallic screw projecting over the right mid quadrant possibly in the colon. Sacral stimulator device is noted. IMPRESSION: 1. No acute cardiopulmonary disease. 2. Possible dilated segment of small bowel in the left upper quadrant, nonspecific but could be due to focal ileus or partial obstruction. 3. 11 mm metallic screw projecting over the right mid quadrant possibly in the colon. Correlate for any history of foreign body ingestion 4. Catheter or possible enteric tube overlying the right upper quadrant, the tip is seen to the right of the L2 vertebral body Electronically Signed   By: Luke Bun M.D.   On: 08/09/2024 20:14    Impression: Hematemesis History of recurrent anastomotic gastrojejunal ulcer Hemoglobin 11.6 from 13.4, normal BUN 18 Mild malnutrition, albumin 3.2, total protein 5.6 CT abdomen and pelvis with contrast: Status post gastric bypass with patent gastrojejunostomy, no evidence of obstruction, indwelling percutaneous gastrostomy within bypass gastric antrum Abdominal x-ray: Possible dilated segment of small bowel in left upper quadrant, nonspecific versus focal ileus or partial obstruction, 11 mm metallic screw projecting over right mid quadrant possibly in colon?  Foreign body ingestion Catheter possible enteric tube over right upper quadrant with tip seen right of L2 vertebra  Plan: Okay to stay on clear liquid diet. Patient currently on D5 lactated Ringer 's at 125 mL/h and on Protonix  40  mg IV every 12 hours. Plan diagnostic EGD in a.m. tomorrow. The risks and the benefits of the procedure were discussed with the patient in details. She understands and verbalizes consent.   LOS: 0 days   Estelita Manas, MD  08/10/2024, 8:45 AM

## 2024-08-11 ENCOUNTER — Inpatient Hospital Stay (HOSPITAL_COMMUNITY)

## 2024-08-11 ENCOUNTER — Encounter (HOSPITAL_COMMUNITY): Payer: Self-pay | Admitting: Internal Medicine

## 2024-08-11 ENCOUNTER — Encounter (HOSPITAL_COMMUNITY): Admission: EM | Disposition: A | Payer: Self-pay | Source: Home / Self Care | Attending: Internal Medicine

## 2024-08-11 DIAGNOSIS — J45909 Unspecified asthma, uncomplicated: Secondary | ICD-10-CM | POA: Diagnosis not present

## 2024-08-11 DIAGNOSIS — K289 Gastrojejunal ulcer, unspecified as acute or chronic, without hemorrhage or perforation: Secondary | ICD-10-CM | POA: Diagnosis not present

## 2024-08-11 DIAGNOSIS — F418 Other specified anxiety disorders: Secondary | ICD-10-CM

## 2024-08-11 DIAGNOSIS — D509 Iron deficiency anemia, unspecified: Secondary | ICD-10-CM | POA: Diagnosis not present

## 2024-08-11 DIAGNOSIS — K920 Hematemesis: Secondary | ICD-10-CM | POA: Diagnosis not present

## 2024-08-11 DIAGNOSIS — G894 Chronic pain syndrome: Secondary | ICD-10-CM | POA: Diagnosis not present

## 2024-08-11 HISTORY — PX: ESOPHAGOGASTRODUODENOSCOPY: SHX5428

## 2024-08-11 LAB — MAGNESIUM: Magnesium: 2.2 mg/dL (ref 1.7–2.4)

## 2024-08-11 LAB — CBC
HCT: 32.6 % — ABNORMAL LOW (ref 36.0–46.0)
Hemoglobin: 10.7 g/dL — ABNORMAL LOW (ref 12.0–15.0)
MCH: 31 pg (ref 26.0–34.0)
MCHC: 32.8 g/dL (ref 30.0–36.0)
MCV: 94.5 fL (ref 80.0–100.0)
Platelets: 254 K/uL (ref 150–400)
RBC: 3.45 MIL/uL — ABNORMAL LOW (ref 3.87–5.11)
RDW: 14.7 % (ref 11.5–15.5)
WBC: 8.5 K/uL (ref 4.0–10.5)
nRBC: 0 % (ref 0.0–0.2)

## 2024-08-11 LAB — BASIC METABOLIC PANEL WITH GFR
Anion gap: 6 (ref 5–15)
BUN: 9 mg/dL (ref 6–20)
CO2: 28 mmol/L (ref 22–32)
Calcium: 9.3 mg/dL (ref 8.9–10.3)
Chloride: 108 mmol/L (ref 98–111)
Creatinine, Ser: 0.59 mg/dL (ref 0.44–1.00)
GFR, Estimated: >60 mL/min (ref >60)
Glucose, Bld: 84 mg/dL (ref 70–99)
Potassium: 4.1 mmol/L (ref 3.5–5.1)
Sodium: 142 mmol/L (ref 135–145)

## 2024-08-11 LAB — GLUCOSE, CAPILLARY
Glucose-Capillary: 96 mg/dL (ref 70–99)
Glucose-Capillary: 102 mg/dL — ABNORMAL HIGH (ref 70–99)

## 2024-08-11 SURGERY — EGD (ESOPHAGOGASTRODUODENOSCOPY)
Anesthesia: Monitor Anesthesia Care

## 2024-08-11 MED ORDER — ATOGEPANT 30 MG PO TABS
1.0000 | ORAL_TABLET | Freq: Every day | ORAL | Status: DC
  Administered 2024-08-17: 30 mg via ORAL
  Filled 2024-08-11: qty 1

## 2024-08-11 MED ORDER — DEXTROSE IN LACTATED RINGERS 5 % IV SOLN: INTRAVENOUS | Status: AC

## 2024-08-11 MED ORDER — BUPRENORPHINE HCL 600 MCG BU FILM
600.0000 ug | ORAL_FILM | Freq: Two times a day (BID) | BUCCAL | Status: DC
Start: 2024-08-11 — End: 2024-08-17
  Administered 2024-08-11 – 2024-08-17 (×11): 600 ug via SUBLINGUAL
  Filled 2024-08-11 (×12): qty 1

## 2024-08-11 MED ORDER — INFLUENZA VIRUS VACC SPLIT PF (FLUZONE) 0.5 ML IM SUSY
0.5000 mL | PREFILLED_SYRINGE | INTRAMUSCULAR | Status: AC
Start: 2024-08-12 — End: 2024-08-14
  Administered 2024-08-14: 0.5 mL via INTRAMUSCULAR
  Filled 2024-08-11: qty 0.5

## 2024-08-11 MED ORDER — HYDROMORPHONE HCL 1 MG/ML IJ SOLN
1.5000 mg | INTRAMUSCULAR | Status: AC | PRN
  Administered 2024-08-11 – 2024-08-13 (×5): 1.5 mg via INTRAVENOUS
  Filled 2024-08-11 (×5): qty 1.5

## 2024-08-11 MED ORDER — OSMOLITE 1.5 CAL PO LIQD
1000.0000 mL | ORAL | Status: AC
  Administered 2024-08-11: 1000 mL
  Filled 2024-08-11: qty 1000

## 2024-08-11 MED ORDER — SODIUM CHLORIDE (PF) 0.9 % IJ SOLN
INTRAMUSCULAR | Status: DC | PRN
  Administered 2024-08-11: 4 mL

## 2024-08-11 MED ORDER — LINACLOTIDE 145 MCG PO CAPS
290.0000 ug | ORAL_CAPSULE | Freq: Every day | ORAL | Status: DC
  Administered 2024-08-12 – 2024-08-17 (×6): 290 ug via ORAL
  Filled 2024-08-11 (×6): qty 2

## 2024-08-11 MED ORDER — OSMOLITE 1.5 CAL PO LIQD
1000.0000 mL | ORAL | Status: DC
  Administered 2024-08-12 – 2024-08-16 (×2): 1000 mL
  Filled 2024-08-11 (×8): qty 1000

## 2024-08-11 MED ORDER — OXYBUTYNIN CHLORIDE 5 MG PO TABS
10.0000 mg | ORAL_TABLET | Freq: Every day | ORAL | Status: DC
  Administered 2024-08-11 – 2024-08-16 (×6): 10 mg via ORAL
  Filled 2024-08-11 (×6): qty 2

## 2024-08-11 MED ORDER — EPINEPHRINE 1 MG/10ML IV SOSY
PREFILLED_SYRINGE | INTRAVENOUS | Status: AC
  Filled 2024-08-11: qty 10

## 2024-08-11 MED ORDER — SODIUM CHLORIDE 0.9 % IV SOLN
INTRAVENOUS | Status: AC | PRN
  Administered 2024-08-11: 500 mL via INTRAVENOUS

## 2024-08-11 MED ORDER — PROPOFOL 500 MG/50ML IV EMUL
INTRAVENOUS | Status: DC | PRN
  Administered 2024-08-11: 150 ug/kg/min via INTRAVENOUS
  Administered 2024-08-11: 100 mg via INTRAVENOUS

## 2024-08-11 MED ORDER — MESALAMINE 1.2 G PO TBEC
2.4000 g | DELAYED_RELEASE_TABLET | Freq: Two times a day (BID) | ORAL | Status: DC
  Administered 2024-08-12 – 2024-08-17 (×10): 2.4 g via ORAL
  Filled 2024-08-11 (×12): qty 2

## 2024-08-11 MED ORDER — HYDROMORPHONE HCL 1 MG/ML IJ SOLN
INTRAMUSCULAR | Status: AC
  Filled 2024-08-11: qty 2

## 2024-08-11 MED ORDER — HYDROMORPHONE HCL 2 MG PO TABS
4.0000 mg | ORAL_TABLET | Freq: Four times a day (QID) | ORAL | Status: DC | PRN
  Administered 2024-08-11 – 2024-08-12 (×3): 4 mg
  Filled 2024-08-11 (×3): qty 2

## 2024-08-11 NOTE — Progress Notes (Signed)
 Initial Nutrition Assessment  DOCUMENTATION CODES:   Non-severe (moderate) malnutrition in context of chronic illness  INTERVENTION:   Initiate night regimen 11/14: -Osmolite 1.5 @ 40 ml/hr x 14 hours via G-tube -Run from 2200-1200 -Provides 840 kcals, 35g protein and 426 ml H2O  Advance next night 11/15 to goal: -Osmolite 1.5 @ 60 ml/hr x 14 hours via G-tube -Run from 2200-1200 -Provides 1260 kcals, 52g protein and 640 ml H2O  Free water  recommendations if not taking in fluids or receiving IVF: -120 ml every 4 hours (720 ml)  After discharge patient can resume home regimen of: -Two Cal HN @ 50 ml/hr x 14 hours via G-tube -Provides 1425 kcals, 59g protein and 498 ml H2O -Free water : 100 ml every 4 hours -PO as tolerated, Fairlife shakes BID -Resume vitamin regimen  NUTRITION DIAGNOSIS:   Moderate Malnutrition related to chronic illness, altered GI function as evidenced by mild fat depletion, moderate muscle depletion.  GOAL:   Patient will meet greater than or equal to 90% of their needs  MONITOR:   PO intake, TF tolerance  REASON FOR ASSESSMENT:   Consult Enteral/tube feeding initiation and management  ASSESSMENT:   48 y.o. 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  Patient in room, family at bedside. Pt visibly uncomfortable, reports feeling bad since EGD this morning. Having nausea and cannot consume PO at this time. Pt reports eating soft easy to swallow foods at home in addition to running tube feeds at night (Two Cal HN- 3 cartons daily). Starts her feeds at 10pm and runs until feeds run out.  Will order regimen similar to home regimen. TwoCal not available on formulary but pt agreeable to 1.5 formula while admitted. Will try to get close to what was provided in home regimen. Pt  reports rate cannot exceed 60-65 ml/hr as this causes her tube to leak. Will start at slightly lower rate to ensure tolerance and then advance to goal the next night.  Pt drinks protein shakes at home, prefers Fairlife shakes. Family can bring in for patient if desired.  Per weight records, pt's weight has been trending down since January 2025. Pt has lost 11 lbs since 1/8 (7% wt loss x 10 months, insignificant for time frame).  Medications: D5 infusion, Zofran   Labs reviewed.  NUTRITION - FOCUSED PHYSICAL EXAM:  Flowsheet Row Most Recent Value  Orbital Region Mild depletion  Upper Arm Region Mild depletion  Thoracic and Lumbar Region Unable to assess  Buccal Region Moderate depletion  Temple Region Moderate depletion  Clavicle Bone Region Moderate depletion  Clavicle and Acromion Bone Region Moderate depletion  Scapular Bone Region Moderate depletion  Dorsal Hand Moderate depletion  Patellar Region Unable to assess  Anterior Thigh Region Unable to assess  Posterior Calf Region Unable to assess  Edema (RD Assessment) None  Hair Reviewed  Eyes Reviewed  Mouth Reviewed  Skin Reviewed  Nails Unable to assess    Diet Order:   Diet Order             Diet full liquid Room service appropriate? Yes; Fluid consistency: Thin  Diet effective now                   EDUCATION NEEDS:   Education needs have been addressed  Skin:  Skin Assessment: Reviewed RN Assessment  Last BM:  11/12  Height:   Ht Readings from Last 1 Encounters:  08/11/24 5' 7 (1.702 m)    Weight:   Wt Readings from Last 1 Encounters:  08/11/24 59 kg    BMI:  Body mass index is 20.36 kg/m.  Estimated Nutritional Needs:   Kcal:  1500-1700  Protein:  75-90g  Fluid:  1.7L/day  Morna Lee, MS, RD, LDN Inpatient Clinical Dietitian Contact via Secure chat

## 2024-08-11 NOTE — Anesthesia Postprocedure Evaluation (Signed)
 Anesthesia Post Note  Patient: [name]  Procedure(s) Performed: EGD (ESOPHAGOGASTRODUODENOSCOPY)     Patient location during evaluation: PACU Anesthesia Type: MAC Level of consciousness: awake and alert Pain management: pain level controlled Vital Signs Assessment: post-procedure vital signs reviewed and stable Respiratory status: spontaneous breathing, nonlabored ventilation, respiratory function stable and patient connected to nasal cannula oxygen Cardiovascular status: stable and blood pressure returned to baseline Postop Assessment: no apparent nausea or vomiting Anesthetic complications: no   No notable events documented.  Last Vitals:  Vitals:   08/11/24 1310 08/11/24 1424  BP: (!) 144/81 127/84  Pulse: 78 77  Resp: 15 17  Temp:  37 C  SpO2: 100% 100%    Last Pain:  Vitals:   08/11/24 1249  TempSrc: Temporal  PainSc:                  Thom JONELLE Peoples

## 2024-08-11 NOTE — Transfer of Care (Signed)
 Immediate Anesthesia Transfer of Care Note  Patient: [name]  Procedure(s) Performed: EGD (ESOPHAGOGASTRODUODENOSCOPY)  Patient Location: PACU  Anesthesia Type:MAC  Level of Consciousness: drowsy  Airway & Oxygen Therapy: Patient Spontanous Breathing  Post-op Assessment: Report given to RN and Post -op Vital signs reviewed and stable  Post vital signs: Reviewed and stable  Last Vitals:  Vitals Value Taken Time  BP 148/79 08/11/24 12:50  Temp 36.5 C 08/11/24 12:49  Pulse 88 08/11/24 12:56  Resp 16 08/11/24 12:56  SpO2 100 % 08/11/24 12:56  Vitals shown include unfiled device data.  Last Pain:  Vitals:   08/11/24 1249  TempSrc: Temporal  PainSc:          Complications: No notable events documented.

## 2024-08-11 NOTE — Plan of Care (Signed)
  Problem: Education: Goal: Knowledge of General Education information will improve Description: Including pain rating scale, medication(s)/side effects and non-pharmacologic comfort measures Outcome: Progressing   Problem: Health Behavior/Discharge Planning: Goal: Ability to manage health-related needs will improve Outcome: Progressing   Problem: Clinical Measurements: Goal: Will remain free from infection Outcome: Progressing Goal: Diagnostic test results will improve Outcome: Progressing Goal: Respiratory complications will improve Outcome: Progressing   Problem: Activity: Goal: Risk for activity intolerance will decrease Outcome: Progressing   Problem: Coping: Goal: Level of anxiety will decrease Outcome: Progressing   Problem: Pain Managment: Goal: General experience of comfort will improve and/or be controlled Outcome: Progressing   Problem: Safety: Goal: Ability to remain free from injury will improve Outcome: Progressing   Problem: Skin Integrity: Goal: Risk for impaired skin integrity will decrease Outcome: Progressing

## 2024-08-11 NOTE — Interval H&P Note (Signed)
 History and Physical Interval Note: 48/female with hematemesis and history of recurrent anastomotic gastrojejunal ulcer for EGD with propofol .  08/11/2024 11:59 AM  [name]  has presented today for EGD with propofol , with the diagnosis of hematemesis,recurrent gastrojejunal anastomosis ulcer.  The various methods of treatment have been discussed with the patient and family. After consideration of risks, benefits and other options for treatment, the patient has consented to  Procedure(s): EGD (ESOPHAGOGASTRODUODENOSCOPY) (N/A) as a surgical intervention.  The patient's history has been reviewed, patient examined, no change in status, stable for surgery.  I have reviewed the patient's chart and labs.  Questions were answered to the patient's satisfaction.     [name]

## 2024-08-11 NOTE — Progress Notes (Signed)
 Progress Note   Patient: [name] FMW:[id number] DOB: June 20, 1976 DOA: 08/09/2024     1 DOS: the patient was seen and examined on 08/11/2024   Brief hospital course: From H&P: [name] is a 48 y.o. 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  Denies any diarrhea.  Patient states she is on chronic pain relief medication including Dilaudid  and Suboxone which she was not able to take because of the vomiting.  She had some episodes of diarrhea over the last few days but usually she is constipated.  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  Also had a repeat EGD in June 2025 at [facility] results of which are not accessible.   ED Course: In the ER patient had a CT abdomen pelvis shows transient intussusception like symptom features for which general surgery has been consulted.  Labs show WBC of 12.7 hemoglobin 13.8 creatinine 1.6.  Patient started on Protonix  IV given the hematemesis admitted for further workup.  Fecal occult blood was negative.   Pt was admitted on evening of 08/09/2024 for further evaluation and management of intractable nausea/vomiting, abdominal pain and hematemesis as outlined in detail below.  Consults:  Gastroenterology General surgery    Assessment and Plan:  Intractable nausea vomiting  Hematemesis History of prior gastric bypass surgery with CT scan showing features concerning for possible transient intussusception.    --General surgery was consulted and discussed case with patient's surgeon at [facility].  See consult note.  No emergent surgical needs. --Follow up at [facility] as scheduled --Smoking cessation advised --G-tube use for feeding and/or meds okay, flush daily  --Gastroenterology consulted  -- EGD today  showed a gastrojejunal anastomosis ulceration and nonbleeding gastric ulcer with adherent clot that was injected and hemostatic spray applied --Advance diet as tolerated --Resume home oral pain meds, with IV Dilaudid  for breakthrough pain --IV antiemetics PRN --IV Protonix  40 mg BID -- Patient requesting to use G-tube for feeds, RD consulted for tube feed orders    Chronic pain takes Dilaudid  as [place]  unable to take because of the vomiting.   --Resume home regimen --Continue IV dilaudid  for breakthrough pain  History of peripheral neuropathy  --Continue gabapentin  if tolerated (hold if N/V)  History of anxiety and depression  --Continue Wellbutrin  Cymbalta  BuSpar  and Klonopin  if  tolerated (hold if N/V)  History of migraines  --On Atogepant   History of B12 deficiency  --On B12 injections monthly          Subjective: Pt seen after EGD this afternoon.  She reports feeling okay, ongoing abdominal pain and nausea.  Pt requesting to use tube feeds instead of eating by mouth until feeling better.  She uses the tube when she has increased nausea.  And takes PO meds by tube as well.  No other acute complaints.    Physical Exam: Vitals:   08/11/24 1249 08/11/24 1300 08/11/24 1310 08/11/24 1424  BP: (!) 148/79 (!) 149/84 (!) 144/81 127/84  Pulse: 88 84 78 77  Resp: 14 15 15 17   Temp: 97.7 F (36.5 C)   98.6 F (37 C)  TempSrc: Temporal     SpO2: 100% 100% 100% 100%  Weight:      Height:       General exam: awake, alert, no acute distress, ill  appearing HEENT:  moist mucus membranes, hearing grossly normal  Respiratory system: on room air, normal respiratory effort. Cardiovascular system: RRR, no pedal edema.   Gastrointestinal system: soft, G tube present Central nervous system: A&O x 3. no gross focal neurologic deficits, normal speech Extremities: moves all, no edema, normal tone Psychiatry: normal mood, congruent affect, judgement and insight appear normal   Data  Reviewed:  Notable labs --  Normal BMP Hbg 10.7 stable WBC 11.6 >> 8.5 normalized   Family Communication: None  Disposition: Status is: Inpatient Remains inpatient appropriate because: ongoing evaluation and requiring IV medications  Planned Discharge Destination: Home    Time spent: 45 minutes  Author: Burnard DELENA Cunning, DO 08/11/2024 5:40 PM  For on call review [email or web address].

## 2024-08-11 NOTE — Op Note (Signed)
 [facility] Patient Name: [name] Procedure Date: 08/11/2024 MRN: [id number] Attending MD: Estelita Manas , MD, [id number] Date of Birth: 1975-11-03 CSN: [id number] Age: 48 Admit Type: Outpatient Procedure:                Upper GI endoscopy Indications:              Hematemesis Providers:                Estelita Manas, MD, Clotilda Schmitz, RN, Hoy Penner,                            RN, Felice Sar, Technician Referring MD:             [facility] Medicines:                Monitored Anesthesia Care Complications:            No immediate complications. Estimated Blood Loss:     Estimated blood loss: none. Procedure:                Pre-Anesthesia Assessment:                           - Prior to the procedure, a History and Physical                            was performed, and patient medications and                            allergies were reviewed. The patient's tolerance of                            previous anesthesia was also reviewed. The risks                            and benefits of the procedure and the sedation                            options and risks were discussed with the patient.                            All questions were answered, and informed consent                            was obtained. Prior Anticoagulants: The patient has                            taken no anticoagulant or antiplatelet agents. ASA                            Grade Assessment: III - A patient with severe                            systemic disease. After reviewing the risks and  benefits, the patient was deemed in satisfactory                            condition to undergo the procedure.                           After obtaining informed consent, the endoscope was                            passed under direct vision. Throughout the                            procedure, the patient's blood pressure, pulse, and                             oxygen saturations were monitored continuously. The                            [id number]-H190 ([id number] Olympus endoscope was introduced                            through the mouth, and advanced to the jejunum. The                            upper GI endoscopy was accomplished without                            difficulty. The patient tolerated the procedure                            well. Scope In: Scope Out: Findings:      The examined esophagus was normal.      The Z-line was regular and was found 35 cm from the incisors.      Evidence of a gastric bypass was found. A gastric pouch with a 5 cm       length from the GE junction to the gastrojejunal anastomosis was found.       The gastrojejunal anastomosis was characterized by ulceration. This was       traversed.      One non-bleeding cratered gastric ulcer noted at anastomosis with       adherent clot. The lesion was at least 40 mm in largest dimension.      Area was successfully injected with 6 mL of a 0.1 mg/mL solution of       epinephrine  for hemostasis.      For hemostasis, hemostatic spray/Nexpowder was applied. There was no       bleeding at the end of the procedure.      The cardia and gastric fundus were normal on retroflexion. Impression:               - Normal esophagus.                           - Z-line regular, 35 cm from the incisors.                           -  Gastric bypass with a pouch 5 cm in length.                            Gastrojejunal anastomosis characterized by                            ulceration.                           - Non-bleeding gastric ulcer with adherent clot.                            Injected. Hemostatic spray applied.                           - No specimens collected. Moderate Sedation:      Patient did not receive moderate sedation for this procedure, but       instead received monitored anesthesia care. Recommendation:           - Advance diet as tolerated.                            - Continue present medications. Procedure Code(s):        --- Professional ---                           [phone], Esophagogastroduodenoscopy, flexible,                            transoral; with control of bleeding, any method Diagnosis Code(s):        --- Professional ---                           K28.9, Gastrojejunal ulcer, unspecified as acute or                            chronic, without hemorrhage or perforation                           K25.4, Chronic or unspecified gastric ulcer with                            hemorrhage                           K92.0, Hematemesis CPT copyright 2022 [employer] Association. All rights reserved. The codes documented in this report are preliminary and upon coder review may  be revised to meet current compliance requirements. Estelita Manas, MD 08/11/2024 12:50:23 PM This report has been signed electronically. Number of Addenda: 0

## 2024-08-11 NOTE — Anesthesia Preprocedure Evaluation (Addendum)
 Anesthesia Evaluation  Patient identified by MRN, date of birth, ID band Patient awake    Reviewed: Allergy & Precautions, H&P , NPO status , Patient's Chart, lab work & pertinent test results  History of Anesthesia Complications (+) PONV and history of anesthetic complications  Airway Mallampati: II  TM Distance: >3 FB Neck ROM: Full    Dental no notable dental hx. (+) Edentulous Upper, Edentulous Lower, Dental Advisory Given   Pulmonary asthma , former smoker   Pulmonary exam normal breath sounds clear to auscultation       Cardiovascular (-) hypertension(-) angina (-) Past MI  Rhythm:Regular Rate:Normal     Neuro/Psych  Headaches PSYCHIATRIC DISORDERS Anxiety Depression    TIA   GI/Hepatic hiatal hernia, PUD,GERD  ,,(+)     substance abuse  Hx of gastric sleeve converted gastric bypass. Now with concern for GIB   Endo/Other  negative endocrine ROS    Renal/GU negative Renal ROS  negative genitourinary   Musculoskeletal  (+) Arthritis ,  narcotic dependent  Abdominal   Peds  Hematology  (+) Blood dyscrasia, anemia   Anesthesia Other Findings Suboxone use   Reproductive/Obstetrics negative OB ROS                              Anesthesia Physical Anesthesia Plan  ASA: 3  Anesthesia Plan: MAC   Post-op Pain Management: Minimal or no pain anticipated   Induction: Intravenous  PONV Risk Score and Plan: 3 and Propofol  infusion and Treatment may vary due to age or medical condition  Airway Management Planned: Natural Airway and Simple Face Mask  Additional Equipment:   Intra-op Plan:   Post-operative Plan:   Informed Consent: I have reviewed the patients History and Physical, chart, labs and discussed the procedure including the risks, benefits and alternatives for the proposed anesthesia with the patient or authorized representative who has indicated his/her understanding and  acceptance.     Dental advisory given  Plan Discussed with: CRNA  Anesthesia Plan Comments: [name] is a 48 y.o. 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  Denies any diarrhea.  Patient states she is on chronic pain relief medication including Dilaudid  and Suboxone which she was not able to take because of the vomiting.  She had some episodes of diarrhea over the last few days but usually she is constipated.  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  Also had a repeat EGD in June 2025 at [facility] results of which are not accessible.)        Anesthesia Quick Evaluation

## 2024-08-12 DIAGNOSIS — F418 Other specified anxiety disorders: Secondary | ICD-10-CM | POA: Diagnosis not present

## 2024-08-12 DIAGNOSIS — Z8719 Personal history of other diseases of the digestive system: Secondary | ICD-10-CM | POA: Diagnosis not present

## 2024-08-12 DIAGNOSIS — G894 Chronic pain syndrome: Secondary | ICD-10-CM | POA: Diagnosis not present

## 2024-08-12 LAB — GLUCOSE, CAPILLARY
Glucose-Capillary: 108 mg/dL — ABNORMAL HIGH (ref 70–99)
Glucose-Capillary: 116 mg/dL — ABNORMAL HIGH (ref 70–99)
Glucose-Capillary: 120 mg/dL — ABNORMAL HIGH (ref 70–99)
Glucose-Capillary: 123 mg/dL — ABNORMAL HIGH (ref 70–99)
Glucose-Capillary: 134 mg/dL — ABNORMAL HIGH (ref 70–99)
Glucose-Capillary: 83 mg/dL (ref 70–99)
Glucose-Capillary: 95 mg/dL (ref 70–99)

## 2024-08-12 LAB — CBC
HCT: 28.7 % — ABNORMAL LOW (ref 36.0–46.0)
Hemoglobin: 9.2 g/dL — ABNORMAL LOW (ref 12.0–15.0)
MCH: 31 pg (ref 26.0–34.0)
MCHC: 32.1 g/dL (ref 30.0–36.0)
MCV: 96.6 fL (ref 80.0–100.0)
Platelets: 208 K/uL (ref 150–400)
RBC: 2.97 MIL/uL — ABNORMAL LOW (ref 3.87–5.11)
RDW: 14.6 % (ref 11.5–15.5)
WBC: 8.3 K/uL (ref 4.0–10.5)
nRBC: 0 % (ref 0.0–0.2)

## 2024-08-12 LAB — MAGNESIUM: Magnesium: 2 mg/dL (ref 1.7–2.4)

## 2024-08-12 LAB — PHOSPHORUS: Phosphorus: 4.1 mg/dL (ref 2.5–4.6)

## 2024-08-12 LAB — OCCULT BLOOD X 1 CARD TO LAB, STOOL: Fecal Occult Bld: POSITIVE — AB

## 2024-08-12 MED ORDER — HYDROMORPHONE HCL 2 MG PO TABS
4.0000 mg | ORAL_TABLET | ORAL | Status: DC | PRN
  Administered 2024-08-12 – 2024-08-15 (×7): 4 mg
  Filled 2024-08-12 (×7): qty 2

## 2024-08-12 NOTE — Plan of Care (Signed)

## 2024-08-12 NOTE — Progress Notes (Signed)
 Progress Note   Patient: [name] FMW:[id number] DOB: 1976/06/02 DOA: 08/09/2024     2 DOS: the patient was seen and examined on 08/12/2024   Brief hospital course: From H&P: [name] is a 48 y.o. 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  Denies any diarrhea.  Patient states she is on chronic pain relief medication including Dilaudid  and Suboxone which she was not able to take because of the vomiting.  She had some episodes of diarrhea over the last few days but usually she is constipated.  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  Also had a repeat EGD in June 2025 at [facility] results of which are not accessible.   ED Course: In the ER patient had a CT abdomen pelvis shows transient intussusception like symptom features for which general surgery has been consulted.  Labs show WBC of 12.7 hemoglobin 13.8 creatinine 1.6.  Patient started on Protonix  IV given the hematemesis admitted for further workup.  Fecal occult blood was negative.   Pt was admitted on evening of 08/09/2024 for further evaluation and management of intractable nausea/vomiting, abdominal pain and hematemesis as outlined in detail below.  Consults:  Gastroenterology General surgery    Assessment and Plan:  Intractable nausea vomiting  Hematemesis History of prior gastric bypass surgery with CT scan showing features concerning for possible transient intussusception.   11/15 --patient continues to have ongoing nausea and significant abdominal pain requiring IV antiemetics and analgesics.  --General surgery was consulted and discussed case with patient's surgeon at [facility].  See consult note.  No emergent surgical needs. --Follow up at [facility] as scheduled --Smoking  cessation advised --G-tube use for feeding and/or meds okay, flush daily  --Gastroenterology consulted  -- EGD today showed a gastrojejunal anastomosis ulceration and nonbleeding gastric ulcer with adherent clot that was injected and hemostatic spray applied --Advance diet as tolerated -- Resumed on home p.o. Dilaudid , with IV Dilaudid  for breakthrough pain --IV antiemetics PRN --IV Protonix  40 mg BID -- Patient requested to be resumed on tube feeds, started 11/14 PM per RD orders  G-tube malfunction -G-tube is leaking tube feed material.  Family note this has been problem at home as well prior to this admission.  Patient asking for it to be addressed while here -- Will discuss with surgery  Chronic pain takes Dilaudid  as Belbuca  unable to take because of the vomiting.   --Resume home regimen --Continue IV dilaudid  for breakthrough pain  History of peripheral neuropathy  --Continue gabapentin  if tolerated (hold if N/V)  History of anxiety and depression  --Continue Wellbutrin  Cymbalta  BuSpar  and Klonopin  if  tolerated (hold if N/V)  History of migraines  --On Atogepant   History of B12 deficiency  --On B12 injections monthly          Subjective: Pt seen this morning at bedside.  She reports her G-tube has been leaking, having to use a towel to absorb tube feed leaking out.  She asked if this can be addressed while she is here.  She continues to have severe abdominal pain and nausea.  No further signs of bleeding.  No other acute complaints.   Physical Exam: Vitals:   08/12/24 0554 08/12/24 0608 08/12/24 0610 08/12/24 1420  BP: (!) 86/51 (!) 97/59  112/75  Pulse:  89   (!) 107  Resp: 14 15 17 16   Temp: 98.6 F (37 C)   98.9 F (37.2 C)  TempSrc: Oral     SpO2:      Weight:      Height:       General exam: awake resting in bed, appears drowsy, no acute distress, ill appearing HEENT:  moist mucus membranes, hearing grossly normal  Respiratory system: on room air,  normal respiratory effort. Cardiovascular system: RRR, no pedal edema.   Gastrointestinal system: soft, G tube present with continuous tube feeds running Central nervous system: A&O x 3. no gross focal neurologic deficits, normal speech Extremities: moves all, no edema, normal tone Psychiatry: normal mood, congruent affect, judgement and insight appear normal   Data Reviewed:  Notable labs --  Normal BMP Hbg 11.6  >> 10.7 >> 9.2   Last BMP on 11/14 normal Phosphorus and magnesium  today normal  Family Communication: None  Disposition: Status is: Inpatient Remains inpatient appropriate because: ongoing evaluation and requiring IV medications, remains on IV PPI and pain uncontrolled.  Discharge pending clearance by consultants   Planned Discharge Destination: Home    Time spent: 45 minutes  Author: Burnard DELENA Cunning, DO 08/12/2024 3:52 PM  For on call review [email or web address].

## 2024-08-12 NOTE — Progress Notes (Addendum)
 [facility] Gastroenterology Progress Note  [name] 48 y.o. May 01, 1976   Subjective: Complaining of pain in RLQ near PEG tube. Denies rectal bleeding. Two sons in room.  Objective: Vital signs: Vitals:   08/12/24 0608 08/12/24 0610  BP: (!) 97/59   Pulse:    Resp: 15 17  Temp:    SpO2:    P 89, T 98.6  Physical Exam: Gen: lethargic, thin, no acute distress  HEENT: anicteric sclera CV: RRR Chest: CTA B Abd: PEG tube in RLQ, tender in RLQ and epigastric with guarding, soft, nondistended, +BS Ext: no edema  Lab Results: Recent Labs    08/10/24 0502 08/11/24 0935 08/12/24 0557  NA 140 142  --   K 3.8 4.1  --   CL 106 108  --   CO2 28 28  --   GLUCOSE 111* 84  --   BUN 18 9  --   CREATININE 0.58 0.59  --   CALCIUM  8.9 9.3  --   MG  --  2.2 2.0  PHOS  --   --  4.1   Recent Labs    08/09/24 1735 08/10/24 0502  AST 14* 11*  ALT 6 <5  ALKPHOS 68 51  BILITOT 0.4 0.3  PROT 7.1 5.6*  ALBUMIN 4.1 3.2*   Recent Labs    08/10/24 1533 08/11/24 0935  WBC 11.6* 8.5  HGB 10.2* 10.7*  HCT 31.3* 32.6*  MCV 93.7 94.5  PLT 257 254      Assessment/Plan: Gastrojejunal anastomotic ulcer with adherent clot seen on EGD yesterday. Continue IV PPI Q 12 hours another 1-2 days. Check CBC. Patient and family report constant leaking around 38 French PEG tube placed by Dr. Lyndel last December and they would like it fixed before discharge. Likely needs to be upsized but with current ulcer that may be problematic. Defer to primary team. Continue PEG tube feeding and full liquid PO. Will follow.   [name] Sol 08/12/2024, 12:39 PM  Questions please call [phone] ID: [name], female   DOB: [birth year], 48 y.o.   MRN: [id number]

## 2024-08-13 ENCOUNTER — Inpatient Hospital Stay (HOSPITAL_COMMUNITY)

## 2024-08-13 ENCOUNTER — Encounter (HOSPITAL_COMMUNITY): Payer: Self-pay | Admitting: Gastroenterology

## 2024-08-13 DIAGNOSIS — G894 Chronic pain syndrome: Secondary | ICD-10-CM | POA: Diagnosis not present

## 2024-08-13 DIAGNOSIS — R509 Fever, unspecified: Secondary | ICD-10-CM | POA: Diagnosis not present

## 2024-08-13 LAB — BASIC METABOLIC PANEL WITH GFR
Anion gap: 5 (ref 5–15)
BUN: 6 mg/dL (ref 6–20)
CO2: 27 mmol/L (ref 22–32)
Calcium: 8.3 mg/dL — ABNORMAL LOW (ref 8.9–10.3)
Chloride: 105 mmol/L (ref 98–111)
Creatinine, Ser: 0.59 mg/dL (ref 0.44–1.00)
GFR, Estimated: >60 mL/min (ref >60)
Glucose, Bld: 121 mg/dL — ABNORMAL HIGH (ref 70–99)
Potassium: 4.3 mmol/L (ref 3.5–5.1)
Sodium: 137 mmol/L (ref 135–145)

## 2024-08-13 LAB — URINALYSIS, ROUTINE W REFLEX MICROSCOPIC
Bilirubin Urine: NEGATIVE
Glucose, UA: NEGATIVE mg/dL
Hgb urine dipstick: NEGATIVE
Ketones, ur: NEGATIVE mg/dL
Leukocytes,Ua: NEGATIVE
Nitrite: NEGATIVE
Protein, ur: NEGATIVE mg/dL
Specific Gravity, Urine: 1.003 — ABNORMAL LOW (ref 1.005–1.030)
pH: 6 (ref 5.0–8.0)

## 2024-08-13 LAB — RESPIRATORY PANEL BY PCR

## 2024-08-13 LAB — GLUCOSE, CAPILLARY
Glucose-Capillary: 136 mg/dL — ABNORMAL HIGH (ref 70–99)
Glucose-Capillary: 80 mg/dL (ref 70–99)
Glucose-Capillary: 136 mg/dL — ABNORMAL HIGH (ref 70–99)
Glucose-Capillary: 112 mg/dL — ABNORMAL HIGH (ref 70–99)
Glucose-Capillary: 97 mg/dL (ref 70–99)
Glucose-Capillary: 74 mg/dL (ref 70–99)

## 2024-08-13 LAB — RESP PANEL BY RT-PCR (RSV, FLU A&B, COVID)  RVPGX2
Influenza A by PCR: NEGATIVE
Influenza B by PCR: NEGATIVE
Resp Syncytial Virus by PCR: NEGATIVE
SARS Coronavirus 2 by RT PCR: NEGATIVE

## 2024-08-13 LAB — CBC
HCT: 26.9 % — ABNORMAL LOW (ref 36.0–46.0)
Hemoglobin: 8.7 g/dL — ABNORMAL LOW (ref 12.0–15.0)
MCH: 30.9 pg (ref 26.0–34.0)
MCHC: 32.3 g/dL (ref 30.0–36.0)
MCV: 95.4 fL (ref 80.0–100.0)
Platelets: 205 K/uL (ref 150–400)
RBC: 2.82 MIL/uL — ABNORMAL LOW (ref 3.87–5.11)
RDW: 14.7 % (ref 11.5–15.5)
WBC: 6.4 K/uL (ref 4.0–10.5)
nRBC: 0 % (ref 0.0–0.2)

## 2024-08-13 LAB — PHOSPHORUS: Phosphorus: 3.6 mg/dL (ref 2.5–4.6)

## 2024-08-13 LAB — MAGNESIUM: Magnesium: 2.2 mg/dL (ref 1.7–2.4)

## 2024-08-13 MED ORDER — SUMATRIPTAN SUCCINATE 50 MG PO TABS
50.0000 mg | ORAL_TABLET | ORAL | Status: DC | PRN
Start: 2024-08-13 — End: 2024-08-17
  Administered 2024-08-13 – 2024-08-15 (×2): 50 mg via ORAL
  Filled 2024-08-13 (×3): qty 1

## 2024-08-13 MED ORDER — ONDANSETRON HCL 4 MG/2ML IJ SOLN
4.0000 mg | Freq: Once | INTRAMUSCULAR | Status: AC | PRN
  Administered 2024-08-14: 4 mg via INTRAVENOUS
  Filled 2024-08-13 (×2): qty 2

## 2024-08-13 MED ORDER — HYDROMORPHONE HCL 1 MG/ML IJ SOLN
0.5000 mg | INTRAMUSCULAR | Status: AC | PRN
  Administered 2024-08-13 – 2024-08-14 (×4): 1 mg via INTRAVENOUS
  Filled 2024-08-13 (×4): qty 1

## 2024-08-13 NOTE — Plan of Care (Signed)
  Problem: Education: Goal: Knowledge of General Education information will improve Description: Including pain rating scale, medication(s)/side effects and non-pharmacologic comfort measures Outcome: Progressing   Problem: Health Behavior/Discharge Planning: Goal: Ability to manage health-related needs will improve Outcome: Progressing   Problem: Clinical Measurements: Goal: Will remain free from infection Outcome: Progressing   Problem: Activity: Goal: Risk for activity intolerance will decrease Outcome: Progressing   Problem: Coping: Goal: Level of anxiety will decrease Outcome: Progressing   Problem: Elimination: Goal: Will not experience complications related to bowel motility Outcome: Progressing Goal: Will not experience complications related to urinary retention Outcome: Progressing   Problem: Safety: Goal: Ability to remain free from injury will improve Outcome: Progressing   Problem: Skin Integrity: Goal: Risk for impaired skin integrity will decrease Outcome: Progressing   Problem: Pain Managment: Goal: General experience of comfort will improve and/or be controlled Outcome: Not Progressing

## 2024-08-13 NOTE — Progress Notes (Addendum)
 Progress Note   Patient: [name] FMW:[id number] DOB: Dec 28, 1975 DOA: 08/09/2024     3 DOS: the patient was seen and examined on 08/13/2024   Brief hospital course: From H&P: [name] is a 48 y.o. 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  Denies any diarrhea.  Patient states she is on chronic pain relief medication including Dilaudid  and Suboxone which she was not able to take because of the vomiting.  She had some episodes of diarrhea over the last few days but usually she is constipated.  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  Also had a repeat EGD in June 2025 at [facility] results of which are not accessible.   ED Course: In the ER patient had a CT abdomen pelvis shows transient intussusception like symptom features for which general surgery has been consulted.  Labs show WBC of 12.7 hemoglobin 13.8 creatinine 1.6.  Patient started on Protonix  IV given the hematemesis admitted for further workup.  Fecal occult blood was negative.   Pt was admitted on evening of 08/09/2024 for further evaluation and management of intractable nausea/vomiting, abdominal pain and hematemesis as outlined in detail below.  Consults:  Gastroenterology General surgery    Assessment and Plan:  Intractable nausea vomiting  Hematemesis History of prior gastric bypass surgery with CT scan showing features concerning for possible transient intussusception.    11/15--16 --patient continues to have ongoing nausea and significant abdominal pain requiring IV antiemetics and analgesics.  --General surgery was consulted and discussed case with patient's surgeon at [facility].  See consult note.  No emergent surgical needs. --Follow up at [facility] as  scheduled --Smoking cessation advised --G-tube use for feeding and/or meds okay, flush daily  --Gastroenterology consulted  -- EGD showed a gastrojejunal anastomosis ulceration and nonbleeding gastric ulcer with adherent clot that was injected and hemostatic spray applied -- Diet per GI -currently full liquids at least tomorrow -- Resumed on home p.o. Dilaudid , with IV Dilaudid  for breakthrough pain --IV antiemetics PRN --IV Protonix  40 mg BID -- Patient requested to be resumed on tube feeds, started 11/14 PM per RD orders  G-tube malfunction -G-tube is leaking tube feed material.  Family note this has been problem at home as well prior to this admission.  Patient asking for it to be addressed while here -- Will discuss with surgery  Fever -temp 100.6 overnight.  Patient reports sore throat, also has some urinary frequency -- Check viral swabs  -- Check UA -- Chest x-ray  Chronic pain takes Dilaudid  as Belbuca  unable to take because of the vomiting.   --Resume home regimen --Continue IV dilaudid  for breakthrough pain  History of peripheral neuropathy  --Continue gabapentin  if tolerated (hold if N/V)  History of anxiety and depression  --Continue Wellbutrin  Cymbalta  BuSpar  and Klonopin  if  tolerated (hold if N/V)  History of migraines  --On Atogepant   History of B12 deficiency  --On B12 injections monthly          Subjective: Patient was sleeping and woke easily to voice when seen this morning.  She had a fever overnight and does report having a sore throat.  Also having some urinary frequency as well.  Tube feed continues to leak around her G-tube.  Has ongoing nausea and uncontrolled pain.  Requesting diet  be advanced to soft solids.  Advised her GI wants on full liquids until at least tomorrow.   Physical Exam: Vitals:   08/12/24 1957 08/13/24 0419 08/13/24 0610 08/13/24 0718  BP: 111/64 (!) 100/50 112/64   Pulse: 89 (!) 109 97   Resp: 20 20 20    Temp: 99.2 F  (37.3 C) (!) 100.6 F (38.1 C) 99.8 F (37.7 C)   TempSrc: Oral Oral Oral   SpO2: 100% 98% 99%   Weight:    60.1 kg  Height:       General exam: Sleeping, wakes to voice, very drowsy, no acute distress, ill appearing HEENT: Difficulty keeping eyes open, moist mucus membranes, hearing grossly normal  Respiratory system: on room air, normal respiratory effort. Cardiovascular system: RRR, no pedal edema.   Gastrointestinal system: soft, G tube present Central nervous system: A&O x 3. no gross focal neurologic deficits, normal speech Psychiatry: normal mood, congruent affect, judgement and insight appear normal   Data Reviewed:  Notable labs --  Normal BMP other than glucose 121, calcium  8.3. Hemoglobin down slightly from 9.2-8.7  FOBT sent yesterday evening positive  Borderline CBGs ranging in the 80s to 136   Family Communication: None  Disposition: Status is: Inpatient Remains inpatient appropriate because: ongoing evaluation and requiring IV medications, remains on IV PPI and pain uncontrolled.  Discharge pending clearance by consultants   Planned Discharge Destination: Home    Time spent: 42 minutes  Author: Burnard DELENA Cunning, DO 08/13/2024 11:56 AM  For on call review [email or web address].

## 2024-08-13 NOTE — Progress Notes (Signed)
 [facility] Gastroenterology Progress Note  CATRENA VARI 48 y.o. [birth year]   Subjective: Frustrated with ongoing leakage around PEG tube.  Complaining of abdominal pain in areas around PEG tube.  Denies rectal bleeding.  Wants to eat solid food.  Objective: Vital signs: Vitals:   08/13/24 0419 08/13/24 0610  BP: (!) 100/50 112/64  Pulse: (!) 109 97  Resp: 20 20  Temp: (!) 100.6 F (38.1 C) 99.8 F (37.7 C)  SpO2: 98% 99%    Physical Exam: Gen: Lethargic, thin, no acute distress   HEENT: anicteric sclera CV: RRR Chest: CTA B Abd: PEG tube with tenderness around it and right lower quadrant and guarding, soft, nondistended, positive bowel sounds Ext: no edema  Lab Results: Recent Labs    08/11/24 0935 08/12/24 0557 08/13/24 0551  NA 142  --  137  K 4.1  --  4.3  CL 108  --  105  CO2 28  --  27  GLUCOSE 84  --  121*  BUN 9  --  6  CREATININE 0.59  --  0.59  CALCIUM  9.3  --  8.3*  MG 2.2 2.0 2.2  PHOS  --  4.1 3.6   No results for input(s): AST, ALT, ALKPHOS, BILITOT, PROT, ALBUMIN in the last 72 hours. Recent Labs    08/12/24 1351 08/13/24 0551  WBC 8.3 6.4  HGB 9.2* 8.7*  HCT 28.7* 26.9*  MCV 96.6 95.4  PLT 208 205      Assessment/Plan: Gastrojejunal anastomotic ulcer with adherent clot seen on 11/14 without any signs of ongoing bleeding despite drop in hemoglobin.  Leaking around PEG tube that patient reports is chronic and needs to be evaluated for upsizing prior to discharge.  However upsizing of PEG tube could potentially aggravate the anastomotic ulcer.  Hesitant to advance her oral intake due to her ulcer and will keep on full liquid diet until tomorrow and if she is stable then consider advancing at that time.  Continue IV Protonix  every 12 hours.  Dr. Elicia will follow-up tomorrow.   [name] Sol 08/13/2024, 7:46 AM  Questions please call [phone] ID: [name], female   DOB: [birth year], 48  y.o.   MRN: [id number]

## 2024-08-13 NOTE — Plan of Care (Signed)

## 2024-08-14 ENCOUNTER — Telehealth: Payer: Self-pay | Admitting: Neurology

## 2024-08-14 DIAGNOSIS — R109 Unspecified abdominal pain: Secondary | ICD-10-CM

## 2024-08-14 DIAGNOSIS — G894 Chronic pain syndrome: Secondary | ICD-10-CM | POA: Diagnosis not present

## 2024-08-14 DIAGNOSIS — Z8719 Personal history of other diseases of the digestive system: Secondary | ICD-10-CM | POA: Diagnosis not present

## 2024-08-14 DIAGNOSIS — Z9884 Bariatric surgery status: Secondary | ICD-10-CM | POA: Diagnosis not present

## 2024-08-14 LAB — PHOSPHORUS: Phosphorus: 4.1 mg/dL (ref 2.5–4.6)

## 2024-08-14 LAB — CBC
HCT: 28.2 % — ABNORMAL LOW (ref 36.0–46.0)
Hemoglobin: 8.8 g/dL — ABNORMAL LOW (ref 12.0–15.0)
MCH: 30.9 pg (ref 26.0–34.0)
MCHC: 31.2 g/dL (ref 30.0–36.0)
MCV: 98.9 fL (ref 80.0–100.0)
Platelets: 219 K/uL (ref 150–400)
RBC: 2.85 MIL/uL — ABNORMAL LOW (ref 3.87–5.11)
RDW: 14.6 % (ref 11.5–15.5)
WBC: 5.7 K/uL (ref 4.0–10.5)
nRBC: 0 % (ref 0.0–0.2)

## 2024-08-14 LAB — GLUCOSE, CAPILLARY
Glucose-Capillary: 86 mg/dL (ref 70–99)
Glucose-Capillary: 64 mg/dL — ABNORMAL LOW (ref 70–99)
Glucose-Capillary: 117 mg/dL — ABNORMAL HIGH (ref 70–99)
Glucose-Capillary: 77 mg/dL (ref 70–99)
Glucose-Capillary: 76 mg/dL (ref 70–99)
Glucose-Capillary: 95 mg/dL (ref 70–99)
Glucose-Capillary: 71 mg/dL (ref 70–99)

## 2024-08-14 LAB — MAGNESIUM: Magnesium: 2.3 mg/dL (ref 1.7–2.4)

## 2024-08-14 MED ORDER — MENTHOL 3 MG MT LOZG
1.0000 | LOZENGE | OROMUCOSAL | Status: DC | PRN
  Filled 2024-08-14: qty 9

## 2024-08-14 MED ORDER — NALOXONE HCL 0.4 MG/ML IJ SOLN
0.4000 mg | INTRAMUSCULAR | Status: DC | PRN
Start: 2024-08-14 — End: 2024-08-17

## 2024-08-14 MED ORDER — GERHARDT'S BUTT CREAM
TOPICAL_CREAM | Freq: Three times a day (TID) | CUTANEOUS | Status: DC
  Filled 2024-08-14: qty 60

## 2024-08-14 MED ORDER — DEXTROSE-SODIUM CHLORIDE 5-0.9 % IV SOLN: INTRAVENOUS | Status: AC

## 2024-08-14 NOTE — Telephone Encounter (Signed)
 Submitted auth request to [facility] plan, received approval. She has to remain B/B due to primary Medicare A+B plan.  Auth#: [id number] (08/14/24-08/14/25)

## 2024-08-14 NOTE — Plan of Care (Signed)
  Problem: Clinical Measurements: Goal: Cardiovascular complication will be avoided Outcome: Progressing   Problem: Activity: Goal: Risk for activity intolerance will decrease Outcome: Progressing   Problem: Elimination: Goal: Will not experience complications related to urinary retention Outcome: Progressing   Problem: Pain Managment: Goal: General experience of comfort will improve and/or be controlled Outcome: Progressing   Problem: Skin Integrity: Goal: Risk for impaired skin integrity will decrease Outcome: Progressing

## 2024-08-14 NOTE — Plan of Care (Signed)
  Problem: Education: Goal: Knowledge of General Education information will improve Description: Including pain rating scale, medication(s)/side effects and non-pharmacologic comfort measures Outcome: Progressing   Problem: Health Behavior/Discharge Planning: Goal: Ability to manage health-related needs will improve Outcome: Progressing   Problem: Activity: Goal: Risk for activity intolerance will decrease Outcome: Progressing   Problem: Safety: Goal: Ability to remain free from injury will improve Outcome: Progressing   Problem: Skin Integrity: Goal: Risk for impaired skin integrity will decrease Outcome: Progressing   Problem: Coping: Goal: Level of anxiety will decrease Outcome: Not Progressing   Problem: Pain Managment: Goal: General experience of comfort will improve and/or be controlled Outcome: Not Progressing

## 2024-08-14 NOTE — Progress Notes (Signed)
 [facility] Gastroenterology Progress Note  [name] 48 y.o. [birth year]  CC: Anastomotic ulcer   Subjective: Patient seen and examined at bedside.  Abdominal pain has improved.  Continues to have leak from G-tube placement in the remanent stomach.  ROS : Afebrile, negative for chest pain   Objective: Vital signs in last 24 hours: Vitals:   08/14/24 0412 08/14/24 1232  BP: 102/62 103/64  Pulse: 89 81  Resp: 17 18  Temp: 98.5 F (36.9 C) 98 F (36.7 C)  SpO2: 99% 97%    Physical Exam:  General:  Alert, cooperative, no distress, appears stated age  Head:  Normocephalic, without obvious abnormality, atraumatic  Eyes:  , EOM's intact,   Lungs:   Clear to auscultation bilaterally, respirations unlabored  Heart:  Regular rate and rhythm, S1, S2 normal  Abdomen:   G-tube in place with dressing around it, abdomen is otherwise soft, nondistended, bowel sound present, no peritoneal signs  Extremities: Extremities normal, atraumatic, no  edema  Pulses: 2+ and symmetric    Lab Results: Recent Labs    08/13/24 0551 08/14/24 0543  NA 137  --   K 4.3  --   CL 105  --   CO2 27  --   GLUCOSE 121*  --   BUN 6  --   CREATININE 0.59  --   CALCIUM  8.3*  --   MG 2.2 2.3  PHOS 3.6 4.1   No results for input(s): AST, ALT, ALKPHOS, BILITOT, PROT, ALBUMIN in the last 72 hours. Recent Labs    08/13/24 0551 08/14/24 0543  WBC 6.4 5.7  HGB 8.7* 8.8*  HCT 26.9* 28.2*  MCV 95.4 98.9  PLT 205 219   No results for input(s): LABPROT, INR in the last 72 hours.    Assessment/Plan: -History of complicated bariatric surgery with initial history of sleeve gastrectomy which was converted to gastric bypass and has been having anastomotic ulcer since then.  Status post G-tube placement on the remnant stomach in December 2024.  Patient with persistent leaking of G-tube  Recommendations ------------------------- - Recent EGD showed large anastomotic ulcer with  adherent blood clots.  Hemoglobin is stable.  Patient would like to advance her diet to soft diet.  Okay to advance diet to soft from GI standpoint.  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 - Upsizing of G-tube per surgical team. - GI will sign off.  Call us  back if needed.   Layla Lah MD, FACP 08/14/2024, 12:44 PM  Contact #  [phone]

## 2024-08-14 NOTE — Progress Notes (Signed)
 Progress Note   Patient: [name] DOB: 10-31-1975 DOA: 08/09/2024     4 DOS: the patient was seen and examined on 08/14/2024   Brief hospital course: From H&P: [name] is a 48 y.o. 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  Denies any diarrhea.  Patient states she is on chronic pain relief medication including Dilaudid  and Suboxone which she was not able to take because of the vomiting.  She had some episodes of diarrhea over the last few days but usually she is constipated.  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  Also had a repeat EGD in June 2025 at [facility] results of which are not accessible.   ED Course: In the ER patient had a CT abdomen pelvis shows transient intussusception like symptom features for which general surgery has been consulted.  Labs show WBC of 12.7 hemoglobin 13.8 creatinine 1.6.  Patient started on Protonix  IV given the hematemesis admitted for further workup.  Fecal occult blood was negative.   Pt was admitted on evening of 08/09/2024 for further evaluation and management of intractable nausea/vomiting, abdominal pain and hematemesis as outlined in detail below.  Consults:  Gastroenterology General surgery    Assessment and Plan:  Intractable nausea vomiting  Hematemesis History of prior gastric bypass surgery with CT scan showing features concerning for possible transient intussusception.    11/15--17 --patient continues to have ongoing nausea and significant abdominal pain requiring IV antiemetics and analgesics.  --General surgery was consulted and discussed case with patient's surgeon at [facility].  See consult note.  No emergent surgical needs. 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 Dilaudid , with IV Dilaudid  for breakthrough pain --IV antiemetics PRN --IV Protonix  40 mg BID -- Patient requested to be resumed on tube feeds, started 11/14 PM per RD orders  G-tube malfunction -G-tube is leaking tube feed material.  Family note this has been problem at home as well prior to this admission.  Patient asking for it to be addressed while here -- Surgery stated that given G-J ulcer with recent and possibly ongoing bleeding, small stomach pouch, too high risk to do anything.  They recommend making sure the flange is flush with the skin at the stoma, and use of Gebherdt's butt cream for skin protection. -Follow up surgery at [facility]  Fever -temp 100.6 early AM 11/15, and again yesterday afternoon 100.8.  Patient reports sore throat, also has some urinary frequency. Afebrile since. -- Check viral swabs  - negative -- Check UA - negative -- Chest x-ray - negative -- Check blood cultures --Monitor CBC, clinically for s/sx's of infection  Chronic pain -- on admission home regimen held, pt had been vomiting & unable to get pain meds.  Now on PO regimen with IV for breakthrough --Resumed home regimen with PO dilaudid , Belbuca  --Reduced IV dilaudid  for breakthrough pain due to over-sedation - Narcan  PRN  Hypoglycemia - due to not tolerating PO intake. --start gentle D5-NS fluids --CBG's & hypoglycemia protocol  History of peripheral neuropathy  --Continue gabapentin  if tolerated (hold if N/V)  History of anxiety and depression  --Continue Wellbutrin  Cymbalta  BuSpar  and Klonopin  if  tolerated (hold if N/V)  History of migraines   --On Atogepant   History of B12 deficiency  --On B12 injections monthly          Subjective: Patient was seen resting in bed this AM, RN present.  Pt reports ongoing uncontrolled abdominal pain and nausea.  Pt very drowsy, difficulty staying awake for conversation.  IV dilaudid  dose was reduced since home PO dilaudid  was resumed, pt requesting to have it increased back.     Physical Exam: Vitals:   08/14/24 0205 08/14/24 0412 08/14/24 1100 08/14/24 1232  BP: 109/67 102/62  103/64  Pulse: 80 89  81  Resp:  17  18  Temp:  98.5 F (36.9 C)  98 F (36.7 C)  TempSrc:      SpO2: 100% 99%  97%  Weight:   70.5 kg   Height:       General exam: awake,very drowsy appearing, no acute distress, ill appearing HEENT: moist mucus membranes, hearing grossly normal  Respiratory system: on room air, normal respiratory effort.  Cardiovascular system: RRR, no pedal edema.   Gastrointestinal system:  not palpated due to pain, G tube present Central nervous system: A&O x 3. no gross focal neurologic deficits, normal speech Psychiatry: normal mood, congruent affect, judgement and insight appear normal   Data Reviewed:  Notable labs --   Hypoglycemic episode 64 >> 117   Hbg 13.4 >> 11.6 >> 10.2 >> 10.7 >> 9.2 >> 8.7 >> 8.8 today    Family Communication: None  Disposition: Status is: Inpatient Remains inpatient appropriate because: ongoing evaluation and requiring IV medications, remains on IV PPI and pain uncontrolled.  Discharge pending clearance by consultants   Planned Discharge Destination: Home    Time spent: 42 minutes  Author: Burnard DELENA Cunning, DO 08/14/2024 4:10 PM  For on call review [email or web address].

## 2024-08-15 ENCOUNTER — Other Ambulatory Visit: Payer: Self-pay

## 2024-08-15 DIAGNOSIS — G894 Chronic pain syndrome: Secondary | ICD-10-CM | POA: Diagnosis not present

## 2024-08-15 DIAGNOSIS — E440 Moderate protein-calorie malnutrition: Secondary | ICD-10-CM | POA: Diagnosis not present

## 2024-08-15 DIAGNOSIS — R109 Unspecified abdominal pain: Secondary | ICD-10-CM | POA: Diagnosis not present

## 2024-08-15 DIAGNOSIS — R112 Nausea with vomiting, unspecified: Secondary | ICD-10-CM | POA: Diagnosis not present

## 2024-08-15 LAB — BASIC METABOLIC PANEL WITH GFR
Anion gap: 5 (ref 5–15)
BUN: <5 mg/dL — ABNORMAL LOW (ref 6–20)
CO2: 31 mmol/L (ref 22–32)
Calcium: 9 mg/dL (ref 8.9–10.3)
Chloride: 105 mmol/L (ref 98–111)
Creatinine, Ser: 0.68 mg/dL (ref 0.44–1.00)
GFR, Estimated: >60 mL/min (ref >60)
Glucose, Bld: 78 mg/dL (ref 70–99)
Potassium: 4.3 mmol/L (ref 3.5–5.1)
Sodium: 141 mmol/L (ref 135–145)

## 2024-08-15 LAB — GLUCOSE, CAPILLARY
Glucose-Capillary: 112 mg/dL — ABNORMAL HIGH (ref 70–99)
Glucose-Capillary: 116 mg/dL — ABNORMAL HIGH (ref 70–99)
Glucose-Capillary: 127 mg/dL — ABNORMAL HIGH (ref 70–99)
Glucose-Capillary: 86 mg/dL (ref 70–99)
Glucose-Capillary: 86 mg/dL (ref 70–99)
Glucose-Capillary: 91 mg/dL (ref 70–99)

## 2024-08-15 LAB — CBC
HCT: 27.9 % — ABNORMAL LOW (ref 36.0–46.0)
Hemoglobin: 8.9 g/dL — ABNORMAL LOW (ref 12.0–15.0)
MCH: 31 pg (ref 26.0–34.0)
MCHC: 31.9 g/dL (ref 30.0–36.0)
MCV: 97.2 fL (ref 80.0–100.0)
Platelets: 236 K/uL (ref 150–400)
RBC: 2.87 MIL/uL — ABNORMAL LOW (ref 3.87–5.11)
RDW: 14.7 % (ref 11.5–15.5)
WBC: 6.2 K/uL (ref 4.0–10.5)
nRBC: 0 % (ref 0.0–0.2)

## 2024-08-15 LAB — PHOSPHORUS: Phosphorus: 4.3 mg/dL (ref 2.5–4.6)

## 2024-08-15 LAB — MAGNESIUM: Magnesium: 2.5 mg/dL — ABNORMAL HIGH (ref 1.7–2.4)

## 2024-08-15 MED ORDER — DEXTROSE-SODIUM CHLORIDE 5-0.9 % IV SOLN: INTRAVENOUS | Status: DC

## 2024-08-15 MED ORDER — BUTALBITAL-APAP-CAFFEINE 50-325-40 MG PO TABS
1.0000 | ORAL_TABLET | Freq: Once | ORAL | Status: AC
  Administered 2024-08-15: 1 via ORAL
  Filled 2024-08-15: qty 1

## 2024-08-15 MED ORDER — SODIUM CHLORIDE 0.9% FLUSH: 10.0000 mL | INTRAVENOUS | Status: DC | PRN

## 2024-08-15 MED ORDER — HYDROMORPHONE HCL 2 MG PO TABS
4.0000 mg | ORAL_TABLET | ORAL | Status: DC | PRN
  Administered 2024-08-15 – 2024-08-17 (×8): 4 mg via ORAL
  Filled 2024-08-15 (×8): qty 2

## 2024-08-15 MED ORDER — ORAL CARE MOUTH RINSE: 15.0000 mL | OROMUCOSAL | Status: DC | PRN

## 2024-08-15 MED ORDER — TRAMADOL HCL 50 MG PO TABS
25.0000 mg | ORAL_TABLET | Freq: Once | ORAL | Status: AC
  Administered 2024-08-15: 25 mg via ORAL
  Filled 2024-08-15: qty 1

## 2024-08-15 MED ORDER — CHLORHEXIDINE GLUCONATE CLOTH 2 % EX PADS
6.0000 | MEDICATED_PAD | Freq: Every day | CUTANEOUS | Status: DC
  Administered 2024-08-15 – 2024-08-17 (×3): 6 via TOPICAL

## 2024-08-15 MED ORDER — SODIUM CHLORIDE 0.9% FLUSH
10.0000 mL | Freq: Two times a day (BID) | INTRAVENOUS | Status: DC
  Administered 2024-08-15 – 2024-08-17 (×5): 10 mL

## 2024-08-15 MED ORDER — METHOCARBAMOL 500 MG PO TABS
500.0000 mg | ORAL_TABLET | Freq: Three times a day (TID) | ORAL | Status: DC | PRN
  Administered 2024-08-16 – 2024-08-17 (×3): 500 mg via ORAL
  Filled 2024-08-15 (×4): qty 1

## 2024-08-15 NOTE — Progress Notes (Signed)
 Progress Note   Patient: [name] FMW:[id number] DOB: 1976-07-15 DOA: 08/09/2024     5 DOS: the patient was seen and examined on 08/15/2024   Brief hospital course: From H&P: [name] is a 48 y.o. 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  Denies any diarrhea.  Patient states she is on chronic pain relief medication including Dilaudid  and Suboxone which she was not able to take because of the vomiting.  She had some episodes of diarrhea over the last few days but usually she is constipated.  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  Also had a repeat EGD in June 2025 at [facility] results of which are not accessible.   ED Course: In the ER patient had a CT abdomen pelvis shows transient intussusception like symptom features for which general surgery has been consulted.  Labs show WBC of 12.7 hemoglobin 13.8 creatinine 1.6.  Patient started on Protonix  IV given the hematemesis admitted for further workup.  Fecal occult blood was negative.   Pt was admitted on evening of 08/09/2024 for further evaluation and management of intractable nausea/vomiting, abdominal pain and hematemesis as outlined in detail below.  Consults:  Gastroenterology General surgery    Assessment and Plan:  Intractable nausea vomiting  Hematemesis History of prior gastric bypass surgery with CT scan showing features concerning for possible transient intussusception.    11/15--17 --patient continues to have ongoing nausea and significant abdominal pain requiring IV antiemetics and analgesics.  --General surgery was consulted and discussed case with patient's surgeon at [facility].  See consult note.  No emergent surgical needs. --Follow up at [facility] as  scheduled --Smoking cessation advised --G-tube use for feeding and/or meds okay, flush daily  --Gastroenterology consulted  -- EGD showed a gastrojejunal anastomosis ulceration and nonbleeding gastric ulcer with adherent clot that was injected and hemostatic spray applied -- Diet per GI -currently full liquids at least tomorrow -- Resumed on home p.o. Dilaudid  --Dc'd IV Dilaudid  -- titrate PO regimen --IV antiemetics PRN --IV Protonix  40 mg BID -- Patient requested to be resumed on tube feeds, started 11/14 PM per RD orders  G-tube malfunction -G-tube is leaking tube feed material.  Family note this has been problem at home as well prior to this admission.  Patient asking for it to be addressed while here -- Surgery stated that given G-J ulcer with recent and possibly ongoing bleeding, small stomach pouch, too high risk to do anything.  They recommend making sure the flange is flush with the skin at the stoma, and use of Gebherdt's butt cream for skin protection. -Follow up surgery at [facility]  Fever -temp 100.6 early AM 11/15, and again yesterday afternoon 100.8.  Patient reports sore throat, also has some urinary frequency. Afebrile since. Respiratory viral swabs  - negative Check UA - negative Chest x-ray - negative -- Follow blood cultures --Monitor CBC, clinically for s/sx's of infection  Chronic pain -- on admission home regimen held, pt had been vomiting & unable to get pain meds.  Now on PO regimen with IV for breakthrough --Resumed home regimen with PO dilaudid , Belbuca  --Reduced IV dilaudid  for breakthrough pain due to over-sedation - Narcan  PRN  Hypoglycemia - due to not tolerating PO intake. CBG's 70's-90's overnight, this AM 112 >> 116 while on  D5-NS fluids --continue D5-NS fluids --CBG's & hypoglycemia protocol  History of peripheral neuropathy  --Continue gabapentin  if tolerated (hold if N/V)  History of anxiety and depression  --Continue Wellbutrin  Cymbalta  BuSpar   and Klonopin  if  tolerated (hold if N/V)  History of migraines  --On Atogepant   History of B12 deficiency  --On B12 injections monthly          Subjective: Patient was seen resting in bed on rounds today.  She reports ongoing abdominal pain.  Minimal PO intake, she thought she wanted to try soft diet, but got very nauseated as soon as food tray was in front of her.  Reports a black / very dark BM.  Lost another IV and asking for a PICC line.   Physical Exam: Vitals:   08/14/24 1949 08/15/24 0437 08/15/24 0500 08/15/24 1200  BP: 115/76 102/70  115/75  Pulse: 82 78  76  Resp: 16 16  (!) 9  Temp: 98.6 F (37 C) 98.7 F (37.1 C)  98.6 F (37 C)  TempSrc: Oral Oral    SpO2: 99% 98%  100%  Weight:   70.8 kg   Height:       General exam: awake,alert, no acute distress, ill appearing HEENT: moist mucus membranes, hearing grossly normal  Respiratory system: on room air, normal respiratory effort.  Cardiovascular system: RRR, no pedal edema.   Gastrointestinal system:  not palpated due to pain, G tube present Central nervous system: A&O x 3. no gross focal neurologic deficits, normal speech Psychiatry: normal mood, congruent affect, judgement and insight appear normal   Data Reviewed:  Notable labs --   CBG's stable, no further hypoglycemic on D5 fluids  Hbg 13.4 >> 11.6 >> 10.2 >> 10.7 >> 9.2 >> 8.7 >> 8.8 >> 8.9 today    Family Communication: None  Disposition: Status is: Inpatient Remains inpatient appropriate because:  requiring IV medications, remains on IV PPI and pain uncontrolled, inadequate PO intake.  Discharge pending clearance by consultants   Planned Discharge Destination: Home    Time spent: 42 minutes  Author: Burnard DELENA Cunning, DO 08/15/2024 1:32 PM  For on call review [email or web address].

## 2024-08-15 NOTE — Progress Notes (Signed)
 Nutrition Follow-up  DOCUMENTATION CODES:   Non-severe (moderate) malnutrition in context of chronic illness  INTERVENTION:   -Continue Osmolite 1.5 @ 60 ml/hr x 14 hours via G-tube -Run from 2200-1200 -Provides 1260 kcals, 52g protein and 640 ml H2O   Free water  recommendations if not taking in fluids or receiving IVF: -120 ml every 4 hours (720 ml)   After discharge patient can resume home regimen of: -Two Cal HN @ 50 ml/hr x 14 hours via G-tube -Provides 1425 kcals, 59g protein and 498 ml H2O -Free water : 100 ml every 4 hours -PO as tolerated, Fairlife shakes BID -Resume vitamin regimen  -PO as tolerated  NUTRITION DIAGNOSIS:   Moderate Malnutrition related to chronic illness, altered GI function as evidenced by mild fat depletion, moderate muscle depletion.  Ongoing.  GOAL:   Patient will meet greater than or equal to 90% of their needs  Progressing.  MONITOR:   PO intake, TF tolerance  ASSESSMENT:   48 y.o. 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  11/12: admitted, NPO 11/13: CLD 11/14: Full liquids 11/17: Soft diet  Patient was advanced to soft diet yesterday but reports not tolerating it very well. Mainly consuming fluids, pudding, ice cream, juice.  Pt to continue 14 hour feeds. G-tube continues to leak. Surgery not recommending surgical intervention and recommending skin protection.   Admission weight: 130 lbs Current weight: 156 lbs I&Os: +8.8L   Medications: Linzess , mesalamine , Phenergan   Labs reviewed: CBGs: 112-116 Elevated Mg  Diet Order:   Diet Order             DIET SOFT Fluid consistency: Thin  Diet effective now                   EDUCATION NEEDS:   Education needs have been addressed  Skin:  Skin Assessment: Reviewed RN Assessment  Last BM:   11/12  Height:   Ht Readings from Last 1 Encounters:  08/11/24 5' 7 (1.702 m)    Weight:   Wt Readings from Last 1 Encounters:  08/15/24 70.8 kg    BMI:  Body mass index is 24.45 kg/m.  Estimated Nutritional Needs:   Kcal:  1500-1700  Protein:  75-90g  Fluid:  1.7L/day  Morna Lee, MS, RD, LDN Inpatient Clinical Dietitian Contact via Secure chat

## 2024-08-15 NOTE — Plan of Care (Signed)

## 2024-08-15 NOTE — Plan of Care (Signed)

## 2024-08-15 NOTE — Progress Notes (Signed)
 Peripherally Inserted Central Catheter Placement  The IV Nurse has discussed with the patient and/or persons authorized to consent for the patient, the purpose of this procedure and the potential benefits and risks involved with this procedure.  The benefits include less needle sticks, lab draws from the catheter, and the patient may be discharged home with the catheter. Risks include, but not limited to, infection, bleeding, blood clot (thrombus formation), and puncture of an artery; nerve damage and irregular heartbeat and possibility to perform a PICC exchange if needed/ordered by physician.  Alternatives to this procedure were also discussed.  Bard Power PICC patient education guide, fact sheet on infection prevention and patient information card has been provided to patient /or left at bedside.    PICC Placement Documentation  PICC Single Lumen 08/15/24 Right Brachial 36 cm 0 cm (Active)  Indication for Insertion or Continuance of Line Poor Vasculature-patient has had multiple peripheral attempts or PIVs lasting less than 24 hours;Limited venous access - need for IV therapy >5 days (PICC only) 08/15/24 1430  Exposed Catheter (cm) 0 cm 08/15/24 1430  Site Assessment Clean, Dry, Intact 08/15/24 1430  Line Status Flushed;Saline locked;Blood return noted 08/15/24 1430  Dressing Type Transparent;Securing device 08/15/24 1430  Dressing Status Antimicrobial disc/dressing in place;Clean, Dry, Intact 08/15/24 1430  Line Care Connections checked and tightened 08/15/24 1430  Line Adjustment (NICU/IV Team Only) No 08/15/24 1430  Dressing Intervention New dressing;Adhesive placed at insertion site (IV team only) 08/15/24 1430  Dressing Change Due 08/22/24 08/15/24 1430       Naveen Lorusso  Con 08/15/2024, 2:35 PM

## 2024-08-16 DIAGNOSIS — R109 Unspecified abdominal pain: Secondary | ICD-10-CM | POA: Diagnosis not present

## 2024-08-16 LAB — CBC
HCT: 28 % — ABNORMAL LOW (ref 36.0–46.0)
Hemoglobin: 8.8 g/dL — ABNORMAL LOW (ref 12.0–15.0)
MCH: 30.6 pg (ref 26.0–34.0)
MCHC: 31.4 g/dL (ref 30.0–36.0)
MCV: 97.2 fL (ref 80.0–100.0)
Platelets: 239 K/uL (ref 150–400)
RBC: 2.88 MIL/uL — ABNORMAL LOW (ref 3.87–5.11)
RDW: 14.7 % (ref 11.5–15.5)
WBC: 6.1 K/uL (ref 4.0–10.5)
nRBC: 0 % (ref 0.0–0.2)

## 2024-08-16 LAB — GLUCOSE, CAPILLARY
Glucose-Capillary: 84 mg/dL (ref 70–99)
Glucose-Capillary: 73 mg/dL (ref 70–99)
Glucose-Capillary: 99 mg/dL (ref 70–99)
Glucose-Capillary: 87 mg/dL (ref 70–99)
Glucose-Capillary: 92 mg/dL (ref 70–99)
Glucose-Capillary: 117 mg/dL — ABNORMAL HIGH (ref 70–99)

## 2024-08-16 MED ORDER — HYDROMORPHONE HCL 2 MG PO TABS
4.0000 mg | ORAL_TABLET | Freq: Once | ORAL | Status: AC
  Administered 2024-08-16: 4 mg via ORAL
  Filled 2024-08-16: qty 2

## 2024-08-16 MED ORDER — PROMETHAZINE HCL 25 MG/ML IJ SOLN
25.0000 mg | Freq: Four times a day (QID) | INTRAMUSCULAR | Status: DC | PRN
  Administered 2024-08-16 (×2): 25 mg via INTRAVENOUS
  Filled 2024-08-16: qty 1
  Filled 2024-08-16: qty 25

## 2024-08-16 MED ORDER — SODIUM CHLORIDE 0.9 % IV BOLUS
1000.0000 mL | Freq: Once | INTRAVENOUS | Status: AC
  Administered 2024-08-16: 1000 mL via INTRAVENOUS

## 2024-08-16 NOTE — Progress Notes (Signed)
 During bedside shift report patient reported she fell in the bathroom earlier on in the shift and didn't report it. She is not injured or having any additional pain. Night shift nurse will follow up. Nightshift charge nurse made aware. Patient educated about being a fall risk and to call us  for help when she needs to get up.

## 2024-08-16 NOTE — Progress Notes (Signed)
 Dayshift RN reported that patient stated she fell earlier during dayshift but did not report to staff. Nightshift RN was aware of. Huddle was done by both charge nurses day and night. Post fall report was done by assigned nurse, safetyZone was done as well. Patient is alert and oriented x 4, no injury, vital signs was taken. Call light was in patient's reach, bed alarm set up and patient was educated about fall risks and verbally understand about fall prevention.

## 2024-08-16 NOTE — Plan of Care (Signed)
  Problem: Clinical Measurements: Goal: Ability to maintain clinical measurements within normal limits will improve Outcome: Progressing   Problem: Clinical Measurements: Goal: Will remain free from infection Outcome: Progressing   Problem: Nutrition: Goal: Adequate nutrition will be maintained Outcome: Progressing   

## 2024-08-16 NOTE — Progress Notes (Signed)
   08/16/24 1327  TOC Brief Assessment  Insurance and Status Reviewed  Patient has primary care physician Yes  Home environment has been reviewed home with family  Prior level of function: modified independent with rollator  Prior/Current Home Services Current home services (tube feeding supplies via [employer])  Social Drivers of Health Review SDOH reviewed no interventions necessary  Readmission risk has been reviewed Yes  Transition of care needs no transition of care needs at this time

## 2024-08-16 NOTE — Progress Notes (Signed)
 [facility] PROGRESS NOTE    Progress Note  [name]  FMW:[id number] DOB: 1976-03-28 DOA: 08/09/2024 PCP: Orion Kerns, MD     Brief Narrative:   [name] is an 48 y.o. 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  CT scan of the abdomen pelvis showed no evidence of bowel obstruction indwelling percutaneous gastrostomy tube, moderate stool burden with a short segment of small bowel interception in the left hemiabdomen   Assessment/Plan:   Intractable Nausea & vomiting/hematemesis/in a patient with a history of prior gastric bypass: General Surgery was consulted who discussed the case with the [facility] surgeon recommended no surgical intervention. Follow-up with [facility] is scheduled. G-tube use for flush meds daily. Gastroenterology was consulted who performed an EGD that showed gastrojejunal anastomosis ulceration and nonbleeding gastric ulcers with an adherent clot it was injected and clipped Currently on on a soft diet. Continue narcotics for pain discontinue IV narcotics. Continue antiemetics. Continue Protonix  twice a day. Tube feedings were resumed on 08/11/2024.  G-tube malfunction: Has been a problem for her at home for quite some time. Surgery relates that given her G-tube with ongoing bleeding she is too high risk to do anything at this point in time.  They recommended to continue flushes and flank flush with the skin hematoma and use Gearhart Butt cream for skin protection. Follow-up with [facility] as an outpatient.  Fevers: Respiratory viral swab panel is negative, UA is negative chest x-ray showed no acute findings. Blood cultures negative till  date. She remain with no leukocytosis. Febrile for the last 4 days.  Chronic pain: Currently on home meds Dilaudid  and Suboxone.  Hypoglycemia: Now on tube feedings and tolerating her diet. Blood glucose remained relatively stable.  Peripheral neuropathy: Continue Neurontin .  Anxiety and depression: Continue Wellbutrin , Cymbalta , BuSpar  and Klonopin .  History of migraines: Continue current meds.   DVT prophylaxis: scd Family Communication:none Status is: Inpatient Remains inpatient appropriate because: Jejunal ulcer    Code Status:     Code Status Orders  (From admission, onward)           Start     Ordered   08/09/24 2338  Full code  Continuous       Question:  By:  Answer:  Consent: discussion documented in EHR   08/09/24 2343           Code Status History     Date Active Date Inactive Code Status Order ID Comments User Context   10/01/2023 0446 10/06/2023 2114 Full Code [id number]  Marcene Eva NOVAK, DO ED   09/14/2023 1512 09/16/2023 2250 Full Code [id number]  Stechschulte, Deward PARAS, MD Inpatient   08/25/2023 1526 08/26/2023 0512 Full Code [id number]  Alona Corners, DO HOV   07/25/2023 0743 08/05/2023 1925 Full Code [id number]  Zella Katha CHRISTELLA, MD ED   01/26/2023 0038 01/29/2023 1838 Full Code [id number]  Tobie Jorie SAUNDERS, MD ED   01/17/2023 2228 01/21/2023 2032 Full Code [id number]  Charlton Evalene RAMAN, MD ED   01/23/2020 1521 01/24/2020 2027 Full Code [id number]  Barbra Rosina SAUNDERS, PA-C Inpatient   04/08/2019 1143 04/10/2019 1325 Full Code [id number]  Melvenia Madelene CHRISTELLA, NP Inpatient   04/08/2019 0355 04/08/2019 1004 Full Code  [id number]  Denette Mclean, PA-C ED   03/10/2018 1114 03/15/2018 2246 Full Code [id number]  Danella Donnice RIGGERS Inpatient   10/25/2017 1413 10/27/2017 1933 Full Code [id number]  Mikell Katz, MD Inpatient   06/08/2017 1942 06/10/2017 1723 Full Code [id number]  Fredirick Glenys RAMAN, MD Inpatient   04/08/2017 1336 05/20/2017 0635 Full Code [id number]  Mikell Katz, MD Outpatient   04/08/2017 1009 04/08/2017 1336 Full Code [id number]  Mikell Katz, MD HOV   04/02/2017 1305 04/06/2017 1522 Full Code [id number]  Mikell Katz, MD Outpatient   12/14/2016 1724 12/17/2016 1438 Full Code [id number]  Mikell Katz, MD Inpatient   05/11/2016 2102 05/15/2016 2019 Full Code [id number]  Mikell Katz, MD Inpatient   04/27/2016 0854 04/28/2016 0314 Full Code [id number]  Mikell Katz, MD HOV   04/24/2016 0851 04/25/2016 0333 Full Code [id number]  Mikell Katz, MD HOV   04/04/2016 1822 04/06/2016 1859 Full Code [id number]  Madelyne Owen LABOR, MD Inpatient   01/21/2016 1830 01/24/2016 1555 Full Code [id number]  Mikell Katz, MD Inpatient   04/26/2012 2022 04/27/2012 1605 Full Code [id number]  Elaine Atkinson, MD Inpatient         IV Access:   Peripheral IV   Procedures and diagnostic studies:   US  EKG SITE RITE Result Date: 08/15/2024 If Site Rite image not attached, placement could not be confirmed due to current cardiac rhythm.    Medical Consultants:   None.   Subjective:    [name] continues to be nauseated.  Objective:    Vitals:   08/15/24 1200 08/15/24 1919 08/16/24 0348 08/16/24 0714  BP: 115/75 (!) 103/56 121/70   Pulse: 76 91 79   Resp: (!) 9 18 20    Temp: 98.6 F (37 C) 98.6 F (37 C) 98.2 F (36.8 C)   TempSrc:  Oral Oral   SpO2: 100% 99% 99%   Weight:    68.6 kg  Height:       SpO2: 99 %  No intake or output data in the 24 hours ending 08/16/24 1056 Filed Weights   08/14/24 1100 08/15/24 0500 08/16/24 0714  Weight: 70.5 kg 70.8 kg 68.6 kg    Exam: General exam: In no acute distress. Respiratory system: Good air movement and clear to auscultation. Cardiovascular system: S1 & S2 heard, RRR. No JVD. Gastrointestinal system: Abdomen is nondistended, soft and nontender.  Skin: No rashes, lesions or ulcers Psychiatry: Judgement and insight appear normal. Mood & affect appropriate.     Data Reviewed:    Labs: Basic Metabolic Panel: Recent Labs  Lab 08/09/24 1735 08/10/24 0502 08/11/24 0935 08/12/24 0557 08/13/24 0551 08/14/24 0543 08/15/24 0609  NA 139 140 142  --  137  --  141  K 3.7 3.8 4.1  --  4.3  --  4.3  CL 102 106 108  --  105  --  105  CO2 28 28 28   --  27  --  31  GLUCOSE 126* 111* 84  --  121*  --  78  BUN 15 18 9   --  6  --  <5*  CREATININE 0.61 0.58 0.59  --  0.59  --  0.68  CALCIUM  9.9 8.9 9.3  --  8.3*  --  9.0  MG  --   --  2.2 2.0 2.2 2.3 2.5*  PHOS  --   --   --  4.1 3.6 4.1 4.3   GFR Estimated Creatinine Clearance: 85.4 mL/min (by C-G formula based on SCr of  0.68 mg/dL). Liver Function Tests: Recent Labs  Lab 08/09/24 1735 08/10/24 0502  AST 14* 11*  ALT 6 <5  ALKPHOS 68 51  BILITOT 0.4 0.3  PROT 7.1 5.6*  ALBUMIN 4.1 3.2*   Recent Labs  Lab 08/09/24 1735  LIPASE 45   No results for input(s): AMMONIA in the last 168 hours. Coagulation profile No results for input(s): INR, PROTIME in the last 168 hours. COVID-19 Labs  No results for input(s): DDIMER, FERRITIN, LDH, CRP in the last 72 hours.  Lab Results  Component Value Date   SARSCOV2NAA NEGATIVE 08/13/2024   SARSCOV2NAA NEGATIVE 01/19/2020   SARSCOV2NAA DETECTED (A) 08/21/2019   SARSCOV2NAA NEGATIVE 04/08/2019    CBC: Recent Labs  Lab 08/12/24 1351 08/13/24 0551 08/14/24 0543 08/15/24 0609 08/16/24 0310  WBC 8.3 6.4 5.7 6.2 6.1  HGB 9.2* 8.7* 8.8* 8.9* 8.8*  HCT 28.7* 26.9* 28.2* 27.9* 28.0*  MCV 96.6 95.4 98.9 97.2 97.2  PLT 208 205 219 236 239   Cardiac Enzymes: No results for input(s): CKTOTAL, CKMB, CKMBINDEX, TROPONINI in the last 168 hours. BNP (last 3 results) No results for input(s): PROBNP in the last 8760 hours. CBG: Recent Labs  Lab 08/15/24 1954 08/15/24 2348 08/16/24 0346 08/16/24 0705 08/16/24 0754  GLUCAP 127* 86 84 73 99   D-Dimer: No results for input(s): DDIMER in the last 72 hours. Hgb  A1c: No results for input(s): HGBA1C in the last 72 hours. Lipid Profile: No results for input(s): CHOL, HDL, LDLCALC, TRIG, CHOLHDL, LDLDIRECT in the last 72 hours. Thyroid  function studies: No results for input(s): TSH, T4TOTAL, T3FREE, THYROIDAB in the last 72 hours.  Invalid input(s): FREET3 Anemia work up: No results for input(s): VITAMINB12, FOLATE, FERRITIN, TIBC, IRON, RETICCTPCT in the last 72 hours. Sepsis Labs: Recent Labs  Lab 08/13/24 0551 08/14/24 0543 08/15/24 0609 08/16/24 0310  WBC 6.4 5.7 6.2 6.1   Microbiology Recent Results (from the past 240 hours)  Respiratory (~20 pathogens) panel by PCR     Status: None   Collection Time: 08/13/24 12:31 PM   Specimen: Nasopharyngeal Swab; Respiratory  Result Value Ref Range Status   Adenovirus NOT DETECTED NOT DETECTED Final   Coronavirus 229E NOT DETECTED NOT DETECTED Final    Comment: (NOTE) The Coronavirus on the Respiratory Panel, DOES NOT test for the novel  Coronavirus (2019 nCoV)    Coronavirus HKU1 NOT DETECTED NOT DETECTED Final   Coronavirus NL63 NOT DETECTED NOT DETECTED Final   Coronavirus OC43 NOT DETECTED NOT DETECTED Final   Metapneumovirus NOT DETECTED NOT DETECTED Final   Rhinovirus / Enterovirus NOT DETECTED NOT DETECTED Final   Influenza A NOT DETECTED NOT DETECTED Final   Influenza B NOT DETECTED NOT DETECTED Final   Parainfluenza Virus 1 NOT DETECTED NOT DETECTED Final   Parainfluenza Virus 2 NOT DETECTED NOT DETECTED Final   Parainfluenza Virus 3 NOT DETECTED NOT DETECTED Final   Parainfluenza Virus 4 NOT DETECTED NOT DETECTED Final   Respiratory Syncytial Virus NOT DETECTED NOT DETECTED Final   Bordetella pertussis NOT DETECTED NOT DETECTED Final   Bordetella Parapertussis NOT DETECTED NOT DETECTED Final   Chlamydophila pneumoniae NOT DETECTED NOT DETECTED Final   Mycoplasma pneumoniae NOT DETECTED NOT DETECTED Final    Comment: Performed at [facility], [address], [place], [place] [address]  Resp panel by RT-PCR (RSV, Flu A&B, Covid) Anterior Nasal Swab     Status: None   Collection Time: 08/13/24 12:31 PM   Specimen:  Anterior Nasal Swab  Result Value Ref Range Status   SARS Coronavirus 2 by RT PCR NEGATIVE NEGATIVE Final    Comment: (NOTE) SARS-CoV-2 target nucleic acids are NOT DETECTED.  The SARS-CoV-2 RNA is generally detectable in upper respiratory specimens during the acute phase of infection. The lowest concentration of SARS-CoV-2 viral copies this assay can detect is 138 copies/mL. A negative result does not preclude SARS-Cov-2 infection and should not be used as the sole basis for treatment or other patient management decisions. A negative result may occur with  improper specimen collection/handling, submission of specimen other than nasopharyngeal swab, presence of viral mutation(s) within the areas targeted by this assay, and inadequate number of viral copies(<138 copies/mL). A negative result must be combined with clinical observations, patient history, and epidemiological information. The expected result is Negative.  Fact Sheet for Patients:  bloggercourse.com  Fact Sheet for Healthcare Providers:  seriousbroker.it  This test is no t yet approved or cleared by the [place]  FDA and  has been authorized for detection and/or diagnosis of SARS-CoV-2 by FDA under an Emergency Use Authorization (EUA). This EUA will remain  in effect (meaning this test can be used) for the duration of the COVID-19 declaration under Section 564(b)(1) of the Act, 21 U.S.C.section 360bbb-3(b)(1), unless the authorization is terminated  or revoked sooner.       Influenza A by PCR NEGATIVE NEGATIVE Final   Influenza B by PCR NEGATIVE NEGATIVE Final    Comment: (NOTE) The Xpert Xpress SARS-CoV-2/FLU/RSV plus assay is intended as an aid in the diagnosis of influenza from  Nasopharyngeal swab specimens and should not be used as a sole basis for treatment. Nasal washings and aspirates are unacceptable for Xpert Xpress SARS-CoV-2/FLU/RSV testing.  Fact Sheet for Patients: bloggercourse.com  Fact Sheet for Healthcare Providers: seriousbroker.it  This test is not yet approved or cleared by the [place]  FDA and has been authorized for detection and/or diagnosis of SARS-CoV-2 by FDA under an Emergency Use Authorization (EUA). This EUA will remain in effect (meaning this test can be used) for the duration of the COVID-19 declaration under Section 564(b)(1) of the Act, 21 U.S.C. section 360bbb-3(b)(1), unless the authorization is terminated or revoked.     Resp Syncytial Virus by PCR NEGATIVE NEGATIVE Final    Comment: (NOTE) Fact Sheet for Patients: bloggercourse.com  Fact Sheet for Healthcare Providers: seriousbroker.it  This test is not yet approved or cleared by the [place]  FDA and has been authorized for detection and/or diagnosis of SARS-CoV-2 by FDA under an Emergency Use Authorization (EUA). This EUA will remain in effect (meaning this test can be used) for the duration of the COVID-19 declaration under Section 564(b)(1) of the Act, 21 U.S.C. section 360bbb-3(b)(1), unless the authorization is terminated or revoked.  Performed at [facility], [address] [place], [place] [address]   Culture, blood (Routine X 2) w Reflex to ID Panel     Status: None (Preliminary result)   Collection Time: 08/14/24 10:28 AM   Specimen: BLOOD  Result Value Ref Range Status   Specimen Description   Final    BLOOD BLOOD LEFT ARM Performed at [facility], [address], [place], [place] [address]    Special Requests   Final    BOTTLES DRAWN AEROBIC AND ANAEROBIC Blood Culture adequate volume Performed at  [facility], [address], [place], [place] [address]    Culture   Final    NO GROWTH  2 DAYS Performed at [facility], [address] [place], [place] [address]    Report Status PENDING  Incomplete  Culture, blood (Routine X 2) w Reflex to ID Panel     Status: None (Preliminary result)   Collection Time: 08/14/24 10:34 AM   Specimen: BLOOD  Result Value Ref Range Status   Specimen Description   Final    BLOOD BLOOD LEFT HAND Performed at [facility], [address] [place], [place] [address]    Special Requests   Final    BOTTLES DRAWN AEROBIC ONLY Blood Culture adequate volume Performed at [facility], [address], [place], [place] [address]    Culture   Final    NO GROWTH 2 DAYS Performed at [facility], [address] [place], [place] [address]    Report Status PENDING  Incomplete     Medications:    Atogepant   1 tablet Oral Daily   Buprenorphine  HCl  600 mcg Sublingual BID   buPROPion   450 mg Oral Daily   busPIRone   15 mg Oral TID   Chlorhexidine  Gluconate Cloth  6 each Topical Daily   DULoxetine   60 mg Oral BID   gabapentin   600 mg Oral TID   Gerhardt's butt cream   Topical TID   linaclotide   290 mcg Oral QAC breakfast   mesalamine   2.4 g Oral BID   oxybutynin   10 mg Oral QHS   pantoprazole  (PROTONIX ) IV  40 mg Intravenous Q12H   sodium chloride  flush  10-40 mL Intracatheter Q12H   Continuous Infusions:  feeding supplement (OSMOLITE 1.5 CAL) 60 mL/hr at 08/14/24 1814   promethazine  (PHENERGAN ) injection (IM or IVPB) 12.5 mg (08/16/24 0810)      LOS: 6 days   Erle Odell Castor  [facility]  08/16/2024, 10:56 AM

## 2024-08-16 NOTE — Plan of Care (Signed)

## 2024-08-17 ENCOUNTER — Other Ambulatory Visit (HOSPITAL_COMMUNITY): Payer: Self-pay

## 2024-08-17 DIAGNOSIS — K920 Hematemesis: Secondary | ICD-10-CM | POA: Diagnosis not present

## 2024-08-17 DIAGNOSIS — K289 Gastrojejunal ulcer, unspecified as acute or chronic, without hemorrhage or perforation: Secondary | ICD-10-CM | POA: Diagnosis not present

## 2024-08-17 DIAGNOSIS — R109 Unspecified abdominal pain: Secondary | ICD-10-CM | POA: Diagnosis not present

## 2024-08-17 LAB — GLUCOSE, CAPILLARY
Glucose-Capillary: 110 mg/dL — ABNORMAL HIGH (ref 70–99)
Glucose-Capillary: 121 mg/dL — ABNORMAL HIGH (ref 70–99)
Glucose-Capillary: 72 mg/dL (ref 70–99)
Glucose-Capillary: 98 mg/dL (ref 70–99)

## 2024-08-17 LAB — CBC
HCT: 26.6 % — ABNORMAL LOW (ref 36.0–46.0)
Hemoglobin: 8.4 g/dL — ABNORMAL LOW (ref 12.0–15.0)
MCH: 31.3 pg (ref 26.0–34.0)
MCHC: 31.6 g/dL (ref 30.0–36.0)
MCV: 99.3 fL (ref 80.0–100.0)
Platelets: 241 K/uL (ref 150–400)
RBC: 2.68 MIL/uL — ABNORMAL LOW (ref 3.87–5.11)
RDW: 14.9 % (ref 11.5–15.5)
WBC: 5.5 K/uL (ref 4.0–10.5)
nRBC: 0 % (ref 0.0–0.2)

## 2024-08-17 MED ORDER — MIDODRINE HCL 10 MG PO TABS
10.0000 mg | ORAL_TABLET | Freq: Three times a day (TID) | ORAL | 0 refills | Status: DC
  Filled 2024-08-17: qty 90, 30d supply, fill #0

## 2024-08-17 MED ORDER — GERHARDT'S BUTT CREAM
1.0000 | TOPICAL_CREAM | Freq: Three times a day (TID) | CUTANEOUS | 3 refills | Status: DC
  Filled 2024-08-17: qty 1, 15d supply, fill #0

## 2024-08-17 MED ORDER — SUCRALFATE 1 G PO TABS
1.0000 g | ORAL_TABLET | Freq: Four times a day (QID) | ORAL | 1 refills | Status: DC
  Filled 2024-08-17: qty 120, 30d supply, fill #0

## 2024-08-17 MED ORDER — PANTOPRAZOLE SODIUM 40 MG PO TBEC: 40.0000 mg | DELAYED_RELEASE_TABLET | Freq: Two times a day (BID) | ORAL | Status: DC

## 2024-08-17 MED ORDER — MIDODRINE HCL 5 MG PO TABS: 5.0000 mg | ORAL_TABLET | Freq: Three times a day (TID) | ORAL | Status: DC

## 2024-08-17 MED ORDER — SODIUM CHLORIDE 0.9 % IV BOLUS
1000.0000 mL | Freq: Once | INTRAVENOUS | Status: AC
  Administered 2024-08-17: 1000 mL via INTRAVENOUS

## 2024-08-17 MED ORDER — PANTOPRAZOLE SODIUM 40 MG PO TBEC
40.0000 mg | DELAYED_RELEASE_TABLET | Freq: Two times a day (BID) | ORAL | 3 refills | Status: DC
  Filled 2024-08-17: qty 120, 60d supply, fill #0

## 2024-08-17 MED ORDER — MIDODRINE HCL 5 MG PO TABS
10.0000 mg | ORAL_TABLET | Freq: Three times a day (TID) | ORAL | Status: DC
  Administered 2024-08-17: 10 mg via ORAL
  Filled 2024-08-17: qty 2

## 2024-08-17 NOTE — TOC Progression Note (Signed)
 Transition of Care [facility]) - Progression Note    Patient Details  Name: [name] MRN: [id number] Date of Birth: [birth year]  [facility] CM/SW Contact  Sonda Manuella Quill, RN Phone Number: 08/17/2024, 11:21 AM  Clinical Narrative:    RN CM notified pt would like to be seen; spoke w/ pt in room; pt said she is concerned about going home and not having 1st dose of midodrine  prior to d/c; pt lives at home w/ her adult children; she plans to return at d/c w/ their support; reassurance provided; explained will notify MD and RN; [name],LPN and Dr Celinda notified via secure chat; no IP CM needs                     Expected Discharge Plan and Services         Expected Discharge Date: 08/17/24                                     Social Drivers of Health (SDOH) Interventions SDOH Screenings   Food Insecurity: No Food Insecurity (08/10/2024)  Housing: Low Risk  (08/11/2024)  Transportation Needs: No Transportation Needs (08/10/2024)  Utilities: Not At Risk (08/10/2024)  Alcohol Screen: Medium Risk (04/08/2019)  Depression (PHQ2-9): High Risk (08/25/2023)  Financial Resource Strain: Low Risk  (04/08/2019)  Physical Activity: Inactive (04/08/2019)  Social Connections: Socially Isolated (04/08/2019)  Stress: Stress Concern Present (04/08/2019)  Tobacco Use: Medium Risk (08/11/2024)    Readmission Risk Interventions    08/16/2024    1:26 PM 09/15/2023   11:43 AM 07/30/2023   11:59 AM  Readmission Risk Prevention Plan  Transportation Screening Complete Complete Complete  PCP or Specialist Appt within 5-7 Days   Complete  PCP or Specialist Appt within 3-5 Days Complete Complete   Home Care Screening   Complete  Medication Review (RN CM)   Complete  HRI or Home Care Consult Complete Complete   Social Work Consult for Recovery Care Planning/Counseling Complete Complete   Palliative Care Screening Not Applicable Not Applicable   Medication  Review Oceanographer) Complete Complete

## 2024-08-17 NOTE — Progress Notes (Signed)
 I attest to student documentation.  Brek Reece V. Tashaun Obey, MSN-RN Nursing Faculty/Clinical Instructor [facility] Associate Degree Nursing Program

## 2024-08-17 NOTE — Discharge Summary (Addendum)
 Physician Discharge Summary  [name] FMW:[id number] DOB: [birth year] DOA: 08/09/2024  PCP: Orion Kerns, MD  Admit date: 08/09/2024 Discharge date: 08/17/2024  Admitted From: Home Disposition:  Home  Recommendations for Outpatient Follow-up:  Follow up with Gen. Surgery in 1-2 weeks Please obtain BMP/CBC in one week   Home Health:No Equipment/Devices:None  Discharge Condition:Stable CODE STATUS:Full Diet recommendation: Heart Healthy  Brief/Interim Summary:  48 y.o. 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  CT scan of the abdomen pelvis showed no evidence of bowel obstruction indwelling percutaneous gastrostomy tube, moderate stool burden with a short segment of small bowel interception in the left hemiabdomen    Discharge Diagnoses:  Principal Problem:   Nausea & vomiting Active Problems:   Intractable abdominal pain   B12 deficiency   History of gastrojejunal ulcer   S/P laparoscopic sleeve gastrectomy   MDD (major depressive disorder), recurrent severe, without psychosis (HCC)   Chronic migraine without aura, with intractable migraine, so stated, with status migrainosus   Malnutrition of moderate degree   Depression with anxiety   Chronic pain   Hematemesis Intractable nausea and vomiting with hematemesis due to gastrojejunal anastomosis with serration: General Surgery was consulted recommended consult GI who performed an EGD that showed gastrojejunal anastomosis ulcer and nonbleeding gastric ulcer with an adherent clot which was treated. On admission she was started on IV Protonix  was continued sucralfate . This was changed to oral she will continue as an  outpatient. Her tube feedings were resumed she will continue them as an outpatient. Her tube feeding was leaking she will follow-up with general surgery as an outpatient to upsizing her G-tube.  G-tube malfunction: She has been having rounds with her G-tube. General surgery was consulted she will follow-up with them as an outpatient to upgrade the size of her G-tube.  Fevers: Workup was completely negative respiratory panel viral swab UA chest x-ray and blood cultures were unremarkable. She remained 72 hours prior to discharge fever free with no leukocytosis she was discharged in stable condition.  Chronic pain: No change made to her medication.  Hypoglycemia: Likely due to decreased oral intake.  Peripheral neuropathy: Continue Neurontin .  Anxiety and depression: Continue Wellbutrin  Cymbalta  BuSpar  and Klonopin .  History of migraines: No change made to her medication.  Moderate protein caloric malnutrition: Continue tube feedings.   Discharge Instructions  Discharge Instructions     Diet - low sodium heart healthy   Complete by: As directed    Increase activity slowly   Complete by: As directed       Allergies as of 08/17/2024       Reactions   Ciprofloxacin  Itching, Rash, Dermatitis   Other Reaction(s): ras, Unknown   Coconut (cocos Nucifera) Anaphylaxis, Itching   Coconut Oil Anaphylaxis, Itching, Other (See Comments)   Other Reaction(s): Anaphylactic reaction   Doxycycline Nausea And Vomiting, Other (See Comments)   Other Reaction(s): Nausea & vomiting, Unknown   Oxycodone Anaphylaxis   Other Reaction(s): Anaphylactic reaction, Unknown   Oxycodone-acetaminophen  Anaphylaxis   Penicillamine Hives, Other (See Comments)   Other Reaction(s): Hives, Not available   Trazodone  And Nefazodone Rash   Vistaril  [hydroxyzine  Hcl] Hives   Morphine     Other Reaction(s): Unknown  Silicone    Wound Dressing Adhesive    Other Reaction(s): Not available   Hydroxyzine      Other Reaction(s): Not available, Rash   Nefazodone    Other Reaction(s): Rash   Penicillin G    Other Reaction(s): Rash, Unknown   Penicillins Rash   Has patient had a PCN reaction causing immediate rash, facial/tongue/throat swelling, SOB or lightheadedness with hypotension: Yes Has patient had a PCN reaction causing severe rash involving mucus membranes or skin necrosis: No Has patient had a PCN reaction that required hospitalization No Has patient had a PCN reaction occurring within the last 10 years: No If all of the above answers are NO, then may proceed with Cephalosporin use.   Tape Rash   And paper tape causes a rash if wearing for a prolong period of time Other Reaction(s): Unknown   Trazodone     Other Reaction(s): Rash, Unknown        Medication List     STOP taking these medications    ACCRUFeR 30 MG Caps Generic drug: Ferric Maltol   acetaminophen  500 MG tablet Commonly known as: TYLENOL    dicyclomine  10 MG capsule Commonly known as: BENTYL    famotidine  20 MG tablet Commonly known as: PEPCID    fluticasone  50 MCG/ACT nasal spray Commonly known as: FLONASE    hydrocortisone  2.5 % rectal cream Commonly known as: ANUSOL -HC   nystatin  100000 UNIT/ML suspension Commonly known as: MYCOSTATIN    SUMAtriptan  50 MG tablet Commonly known as: IMITREX    Symproic  0.2 MG Tabs Generic drug: Naldemedine Tosylate    torsemide  5 MG tablet Commonly known as: DEMADEX        TAKE these medications    albuterol  108 (90 Base) MCG/ACT inhaler Commonly known as: VENTOLIN  HFA Inhale 2 puffs into the lungs every 6 (six) hours as needed for wheezing or shortness of breath.   atorvastatin  40 MG tablet Commonly known as: LIPITOR Take 40 mg by mouth daily.   Belbuca  75 MCG Film Generic drug: Buprenorphine  HCl Place 600 mcg under the tongue in the morning and at bedtime.   Botox  200 units injection Generic drug: botulinum toxin Type A  INJECT 155 UNITS  INTRAMUSCULARLY INTO HEAD AND NECK MUSCLES EVERY 3 MONTHS   buPROPion  150 MG 24 hr tablet Commonly known as: WELLBUTRIN  XL Take 150 mg by mouth daily. Take with 300mg  for total dose 450mg    buPROPion  300 MG 24 hr tablet Commonly known as: WELLBUTRIN  XL Take 300 mg by mouth every morning. Take along with 150 mg=450 mg   busPIRone  15 MG tablet Commonly known as: BUSPAR  Take 15 mg by mouth 3 (three) times daily.   clonazePAM  1 MG tablet Commonly known as: KLONOPIN  Take 1 mg by mouth 2 (two) times daily as needed for anxiety.   cyanocobalamin  1000 MCG/ML injection Commonly known as: VITAMIN B12 Inject 1,000 mcg into the muscle 2 (two) times a week.   diclofenac  Sodium 1 % Gel Commonly known as: VOLTAREN  Apply 2 g topically 4 (four) times daily as needed (Pain).   DULoxetine  60 MG capsule Commonly known as: CYMBALTA  Take 1 capsule (60 mg total) by mouth 2 (two) times daily.   EPINEPHrine  0.3 mg/0.3 mL Soaj injection Commonly known as: EPI-PEN Inject 0.3 mg into the muscle as needed for anaphylaxis.   ferrous sulfate  325 (65 FE) MG tablet Take 1 tablet (325 mg total) by mouth daily with breakfast.   gabapentin  600 MG tablet Commonly known as: NEURONTIN  Take 600 mg by mouth 3 (three) times daily.  Gerhardt's butt cream Crea Apply 1 Application topically 3 (three) times daily.   HYDROmorphone  4 MG tablet Commonly known as: DILAUDID  Take 4 mg by mouth every 6 (six) hours as needed.   linaclotide  290 MCG Caps capsule Commonly known as: LINZESS  Take 290 mcg by mouth daily before breakfast.   magnesium  oxide 400 (240 Mg) MG tablet Commonly known as: MAG-OX Take 400 mg by mouth daily.   meclizine  25 MG tablet Commonly known as: ANTIVERT  Take 25 mg by mouth 3 (three) times daily.   mesalamine  1.2 g EC tablet Commonly known as: LIALDA  Take 2.4 g by mouth 2 (two) times daily.   midodrine  10 MG tablet Commonly known as: PROAMATINE  Take 1 tablet (10 mg total) by mouth  3 (three) times daily with meals. What changed:  medication strength how much to take   multivitamin with minerals Tabs tablet Take 1 tablet by mouth daily.   Narcan  4 MG/0.1ML Liqd nasal spray kit Generic drug: naloxone  Place 1 spray into the nose daily as needed (overdose).   ondansetron  8 MG disintegrating tablet Commonly known as: ZOFRAN -ODT Take 8 mg by mouth 3 (three) times daily.   oxybutynin  5 MG tablet Commonly known as: DITROPAN  Take 10 mg by mouth at bedtime.   pantoprazole  40 MG tablet Commonly known as: PROTONIX  Take 1 tablet (40 mg total) by mouth 2 (two) times daily.   Qulipta  30 MG Tabs Generic drug: Atogepant  TAKE 1 TABLET BY MOUTH EVERY DAY   sucralfate  1 g tablet Commonly known as: CARAFATE  Take 1 tablet (1 g total) by mouth every 6 (six) hours. What changed: when to take this   thiamine  100 MG tablet Commonly known as: Vitamin B-1 Take 100 mg by mouth daily at 12 noon.   TwoCal HN 2.0 Liqd 711 mLs by Per J Tube route at bedtime.   valACYclovir  1000 MG tablet Commonly known as: VALTREX  Take 1,000 mg by mouth 2 (two) times daily as needed.   Vitamin D3 1.25 MG (50000 UT) Caps Take 50,000 Units by mouth every 7 (seven) days.        Allergies  Allergen Reactions   Ciprofloxacin  Itching, Rash and Dermatitis    Other Reaction(s): ras, Unknown   Coconut (Cocos Nucifera) Anaphylaxis and Itching   Coconut Oil Anaphylaxis, Itching and Other (See Comments)    Other Reaction(s): Anaphylactic reaction   Doxycycline Nausea And Vomiting and Other (See Comments)    Other Reaction(s): Nausea & vomiting, Unknown   Oxycodone Anaphylaxis    Other Reaction(s): Anaphylactic reaction, Unknown   Oxycodone-Acetaminophen  Anaphylaxis   Penicillamine Hives and Other (See Comments)    Other Reaction(s): Hives, Not available   Trazodone  And Nefazodone Rash   Vistaril  [Hydroxyzine  Hcl] Hives   Morphine      Other Reaction(s): Unknown   Silicone    Wound  Dressing Adhesive     Other Reaction(s): Not available   Hydroxyzine      Other Reaction(s): Not available, Rash   Nefazodone     Other Reaction(s): Rash   Penicillin G     Other Reaction(s): Rash, Unknown   Penicillins Rash    Has patient had a PCN reaction causing immediate rash, facial/tongue/throat swelling, SOB or lightheadedness with hypotension: Yes Has patient had a PCN reaction causing severe rash involving mucus membranes or skin necrosis: No Has patient had a PCN reaction that required hospitalization No Has patient had a PCN reaction occurring within the last 10 years: No If all of the above answers  are NO, then may proceed with Cephalosporin use.   Tape Rash    And paper tape causes a rash if wearing for a prolong period of time  Other Reaction(s): Unknown   Trazodone      Other Reaction(s): Rash, Unknown    Consultations: Gastroenterology General Surgery   Procedures/Studies: US  EKG SITE RITE Result Date: 08/15/2024 If Site Rite image not attached, placement could not be confirmed due to current cardiac rhythm.  DG CHEST PORT 1 VIEW Result Date: 08/13/2024 EXAM: 1 VIEW(S) XRAY OF THE CHEST 08/13/2024 01:20:00 AM COMPARISON: Comparison study 08/09/2024. CLINICAL HISTORY: Fever. FINDINGS: LUNGS AND PLEURA: No acute pulmonary process. No infiltrates, edema, or effusions. No pneumothorax. HEART AND MEDIASTINUM: The cardiac silhouette, mediastinal, and hilar contours are within normal limits and stable. BONES AND SOFT TISSUES: The bony thorax is intact. IMPRESSION: 1. No acute cardiopulmonary process. Electronically signed by: Maude Stammer MD 08/13/2024 01:40 PM EST RP Workstation: [id number]   CT ABDOMEN PELVIS W CONTRAST Result Date: 08/09/2024 EXAM: CT ABDOMEN AND PELVIS WITH CONTRAST 08/09/2024 08:25:04 PM TECHNIQUE: CT of the abdomen and pelvis was performed with the administration of 100 mL of iohexol  (OMNIPAQUE ) 300 MG/ML solution. Multiplanar reformatted  images are provided for review. Automated exposure control, iterative reconstruction, and/or weight-based adjustment of the mA/kV was utilized to reduce the radiation dose to as low as reasonably achievable. COMPARISON: 10/01/2023 CLINICAL HISTORY: Abdominal pain, acute, nonlocalized; hematemesis, abd pain, hx of UC and stomach ulcer. FINDINGS: LOWER CHEST: No acute abnormality. LIVER: Subcentimeter right hepatic cyst, benign. GALLBLADDER AND BILE DUCTS: Gallbladder is unremarkable. No biliary ductal dilatation. SPLEEN: 2.3 cm splenic cyst unchanged, benign. PANCREAS: No acute abnormality. ADRENAL GLANDS: No acute abnormality. KIDNEYS, URETERS AND BLADDER: No stones in the kidneys or ureters. No hydronephrosis. No perinephric or periureteral stranding. Urinary bladder is unremarkable. GI AND BOWEL: Status post gastric bypass with patent gastrojejunostomy. Indwelling percutaneous gastrostomy within the bypassed gastric antrum. Moderate colonic stool burden, suggesting mild constipation. Short segment small bowel intussusception in the left hemiabdomen, typically a transient finding. PERITONEUM AND RETROPERITONEUM: Small volume free fluid in the pelvis. No free air. VASCULATURE: Aorta is normal in caliber. LYMPH NODES: No lymphadenopathy. REPRODUCTIVE ORGANS: Status post hysterectomy. BONES AND SOFT TISSUES: No acute osseous abnormality. No focal soft tissue abnormality. IMPRESSION: 1. Status post gastric bypass with patent gastrojejunostomy. No evidence of bowel obstruction. 2. Indwelling percutaneous gastrostomy within the bypassed gastric antrum. 3. Additional ancillary findings, as above. Electronically signed by: Pinkie Pebbles MD 08/09/2024 08:32 PM EST RP Workstation: [id number]   DG ABD ACUTE 2+V W 1V CHEST Result Date: 08/09/2024 CLINICAL DATA:  Abdomen pain vomiting blood EXAM: DG ABDOMEN ACUTE WITH 1 VIEW CHEST COMPARISON:  02/19/2024, chest x-ray 08/21/2019 FINDINGS: Single-view chest demonstrates no  focal opacity or pleural effusion. Normal cardiomediastinal silhouette. No pneumothorax. Supine and upright views of the abdomen are obtained. Postsurgical changes in the left upper quadrant. Possible dilated segment of small bowel measuring up to 3.3 cm in the left upper quadrant. Scattered bowel gas elsewhere. Catheter in the right upper quadrant with tip projecting in the mid abdominal region. 11 mm metallic screw projecting over the right mid quadrant possibly in the colon. Sacral stimulator device is noted. IMPRESSION: 1. No acute cardiopulmonary disease. 2. Possible dilated segment of small bowel in the left upper quadrant, nonspecific but could be due to focal ileus or partial obstruction. 3. 11 mm metallic screw projecting over the right mid quadrant possibly in the colon. Correlate for any history  of foreign body ingestion 4. Catheter or possible enteric tube overlying the right upper quadrant, the tip is seen to the right of the L2 vertebral body Electronically Signed   By: Luke Bun M.D.   On: 08/09/2024 20:14     Subjective: No complaints  Discharge Exam: Vitals:   08/17/24 0522 08/17/24 0850  BP: 108/67 (!) 108/57  Pulse:  88  Resp:    Temp:  97.7 F (36.5 C)  SpO2:  100%   Vitals:   08/17/24 0438 08/17/24 0500 08/17/24 0522 08/17/24 0850  BP: (!) 90/48  108/67 (!) 108/57  Pulse: 85   88  Resp:      Temp:    97.7 F (36.5 C)  TempSrc:    Oral  SpO2:    100%  Weight:  73 kg    Height:        General: Pt is alert, awake, not in acute distress Cardiovascular: RRR, S1/S2 +, no rubs, no gallops Respiratory: CTA bilaterally, no wheezing, no rhonchi Abdominal: Soft, NT, ND, bowel sounds + Extremities: no edema, no cyanosis    The results of significant diagnostics from this hospitalization (including imaging, microbiology, ancillary and laboratory) are listed below for reference.     Microbiology: Recent Results (from the past 240 hours)  Respiratory (~20  pathogens) panel by PCR     Status: None   Collection Time: 08/13/24 12:31 PM   Specimen: Nasopharyngeal Swab; Respiratory  Result Value Ref Range Status   Adenovirus NOT DETECTED NOT DETECTED Final   Coronavirus 229E NOT DETECTED NOT DETECTED Final    Comment: (NOTE) The Coronavirus on the Respiratory Panel, DOES NOT test for the novel  Coronavirus (2019 nCoV)    Coronavirus HKU1 NOT DETECTED NOT DETECTED Final   Coronavirus NL63 NOT DETECTED NOT DETECTED Final   Coronavirus OC43 NOT DETECTED NOT DETECTED Final   Metapneumovirus NOT DETECTED NOT DETECTED Final   Rhinovirus / Enterovirus NOT DETECTED NOT DETECTED Final   Influenza A NOT DETECTED NOT DETECTED Final   Influenza B NOT DETECTED NOT DETECTED Final   Parainfluenza Virus 1 NOT DETECTED NOT DETECTED Final   Parainfluenza Virus 2 NOT DETECTED NOT DETECTED Final   Parainfluenza Virus 3 NOT DETECTED NOT DETECTED Final   Parainfluenza Virus 4 NOT DETECTED NOT DETECTED Final   Respiratory Syncytial Virus NOT DETECTED NOT DETECTED Final   Bordetella pertussis NOT DETECTED NOT DETECTED Final   Bordetella Parapertussis NOT DETECTED NOT DETECTED Final   Chlamydophila pneumoniae NOT DETECTED NOT DETECTED Final   Mycoplasma pneumoniae NOT DETECTED NOT DETECTED Final    Comment: Performed at [facility], [address], [place], [place] [address]  Resp panel by RT-PCR (RSV, Flu A&B, Covid) Anterior Nasal Swab     Status: None   Collection Time: 08/13/24 12:31 PM   Specimen: Anterior Nasal Swab  Result Value Ref Range Status   SARS Coronavirus 2 by RT PCR NEGATIVE NEGATIVE Final    Comment: (NOTE) SARS-CoV-2 target nucleic acids are NOT DETECTED.  The SARS-CoV-2 RNA is generally detectable in upper respiratory specimens during the acute phase of infection. The lowest concentration of SARS-CoV-2 viral copies this assay can detect is 138 copies/mL. A negative result does not preclude SARS-Cov-2 infection and should not be used  as the sole basis for treatment or other patient management decisions. A negative result may occur with  improper specimen collection/handling, submission of specimen other than nasopharyngeal swab, presence of viral mutation(s) within the areas  targeted by this assay, and inadequate number of viral copies(<138 copies/mL). A negative result must be combined with clinical observations, patient history, and epidemiological information. The expected result is Negative.  Fact Sheet for Patients:  bloggercourse.com  Fact Sheet for Healthcare Providers:  seriousbroker.it  This test is no t yet approved or cleared by the [place]  FDA and  has been authorized for detection and/or diagnosis of SARS-CoV-2 by FDA under an Emergency Use Authorization (EUA). This EUA will remain  in effect (meaning this test can be used) for the duration of the COVID-19 declaration under Section 564(b)(1) of the Act, 21 U.S.C.section 360bbb-3(b)(1), unless the authorization is terminated  or revoked sooner.       Influenza A by PCR NEGATIVE NEGATIVE Final   Influenza B by PCR NEGATIVE NEGATIVE Final    Comment: (NOTE) The Xpert Xpress SARS-CoV-2/FLU/RSV plus assay is intended as an aid in the diagnosis of influenza from Nasopharyngeal swab specimens and should not be used as a sole basis for treatment. Nasal washings and aspirates are unacceptable for Xpert Xpress SARS-CoV-2/FLU/RSV testing.  Fact Sheet for Patients: bloggercourse.com  Fact Sheet for Healthcare Providers: seriousbroker.it  This test is not yet approved or cleared by the [place]  FDA and has been authorized for detection and/or diagnosis of SARS-CoV-2 by FDA under an Emergency Use Authorization (EUA). This EUA will remain in effect (meaning this test can be used) for the duration of the COVID-19 declaration under Section 564(b)(1)  of the Act, 21 U.S.C. section 360bbb-3(b)(1), unless the authorization is terminated or revoked.     Resp Syncytial Virus by PCR NEGATIVE NEGATIVE Final    Comment: (NOTE) Fact Sheet for Patients: bloggercourse.com  Fact Sheet for Healthcare Providers: seriousbroker.it  This test is not yet approved or cleared by the [place]  FDA and has been authorized for detection and/or diagnosis of SARS-CoV-2 by FDA under an Emergency Use Authorization (EUA). This EUA will remain in effect (meaning this test can be used) for the duration of the COVID-19 declaration under Section 564(b)(1) of the Act, 21 U.S.C. section 360bbb-3(b)(1), unless the authorization is terminated or revoked.  Performed at [facility], [address] [place], [place] [address]   Culture, blood (Routine X 2) w Reflex to ID Panel     Status: None (Preliminary result)   Collection Time: 08/14/24 10:28 AM   Specimen: BLOOD  Result Value Ref Range Status   Specimen Description   Final    BLOOD BLOOD LEFT ARM Performed at [facility], [address], [place], [place] [address]    Special Requests   Final    BOTTLES DRAWN AEROBIC AND ANAEROBIC Blood Culture adequate volume Performed at [facility], [address], [place], [place] [address]    Culture   Final    NO GROWTH 3 DAYS Performed at [facility], [address], [place], [place] [address]    Report Status PENDING  Incomplete  Culture, blood (Routine X 2) w Reflex to ID Panel     Status: None (Preliminary result)   Collection Time: 08/14/24 10:34 AM   Specimen: BLOOD  Result Value Ref Range Status   Specimen Description   Final    BLOOD BLOOD LEFT HAND Performed at [facility], [address], [place], [place] [address]    Special Requests   Final    BOTTLES DRAWN AEROBIC ONLY Blood Culture adequate  volume Performed at [facility],  [address], [place], [place] [address]    Culture   Final    NO GROWTH 3 DAYS Performed at [facility], [address] [place], [place] [address]    Report Status PENDING  Incomplete     Labs: BNP (last 3 results) No results for input(s): BNP in the last 8760 hours. Basic Metabolic Panel: Recent Labs  Lab 08/11/24 0935 08/12/24 0557 08/13/24 0551 08/14/24 0543 08/15/24 0609  NA 142  --  137  --  141  K 4.1  --  4.3  --  4.3  CL 108  --  105  --  105  CO2 28  --  27  --  31  GLUCOSE 84  --  121*  --  78  BUN 9  --  6  --  <5*  CREATININE 0.59  --  0.59  --  0.68  CALCIUM  9.3  --  8.3*  --  9.0  MG 2.2 2.0 2.2 2.3 2.5*  PHOS  --  4.1 3.6 4.1 4.3   Liver Function Tests: No results for input(s): AST, ALT, ALKPHOS, BILITOT, PROT, ALBUMIN in the last 168 hours. No results for input(s): LIPASE, AMYLASE in the last 168 hours. No results for input(s): AMMONIA in the last 168 hours. CBC: Recent Labs  Lab 08/13/24 0551 08/14/24 0543 08/15/24 0609 08/16/24 0310 08/17/24 0530  WBC 6.4 5.7 6.2 6.1 5.5  HGB 8.7* 8.8* 8.9* 8.8* 8.4*  HCT 26.9* 28.2* 27.9* 28.0* 26.6*  MCV 95.4 98.9 97.2 97.2 99.3  PLT 205 219 236 239 241   Cardiac Enzymes: No results for input(s): CKTOTAL, CKMB, CKMBINDEX, TROPONINI in the last 168 hours. BNP: Invalid input(s): POCBNP CBG: Recent Labs  Lab 08/16/24 1608 08/16/24 2026 08/17/24 0003 08/17/24 0440 08/17/24 0842  GLUCAP 92 117* 72 98 121*   D-Dimer No results for input(s): DDIMER in the last 72 hours. Hgb A1c No results for input(s): HGBA1C in the last 72 hours. Lipid Profile No results for input(s): CHOL, HDL, LDLCALC, TRIG, CHOLHDL, LDLDIRECT in the last 72 hours. Thyroid  function studies No results for input(s): TSH, T4TOTAL, T3FREE, THYROIDAB in the last 72 hours.  Invalid input(s): FREET3 Anemia work  up No results for input(s): VITAMINB12, FOLATE, FERRITIN, TIBC, IRON, RETICCTPCT in the last 72 hours. Urinalysis    Component Value Date/Time   COLORURINE STRAW (A) 08/13/2024 1440   APPEARANCEUR CLEAR 08/13/2024 1440   APPEARANCEUR Cloudy (A) 12/15/2017 1722   LABSPEC 1.003 (L) 08/13/2024 1440   PHURINE 6.0 08/13/2024 1440   GLUCOSEU NEGATIVE 08/13/2024 1440   HGBUR NEGATIVE 08/13/2024 1440   BILIRUBINUR NEGATIVE 08/13/2024 1440   BILIRUBINUR negative 01/05/2020 1434   BILIRUBINUR Negative 12/15/2017 1722   KETONESUR NEGATIVE 08/13/2024 1440   PROTEINUR NEGATIVE 08/13/2024 1440   UROBILINOGEN 0.2 01/05/2020 1434   UROBILINOGEN 0.2 11/29/2018 1742   NITRITE NEGATIVE 08/13/2024 1440   LEUKOCYTESUR NEGATIVE 08/13/2024 1440   Sepsis Labs Recent Labs  Lab 08/14/24 0543 08/15/24 0609 08/16/24 0310 08/17/24 0530  WBC 5.7 6.2 6.1 5.5   Microbiology Recent Results (from the past 240 hours)  Respiratory (~20 pathogens) panel by PCR     Status: None   Collection Time: 08/13/24 12:31 PM   Specimen: Nasopharyngeal Swab; Respiratory  Result Value Ref Range Status   Adenovirus NOT DETECTED NOT DETECTED Final   Coronavirus 229E NOT DETECTED NOT DETECTED Final    Comment: (NOTE) The Coronavirus on the Respiratory Panel, DOES NOT test for the  novel  Coronavirus (2019 nCoV)    Coronavirus HKU1 NOT DETECTED NOT DETECTED Final   Coronavirus NL63 NOT DETECTED NOT DETECTED Final   Coronavirus OC43 NOT DETECTED NOT DETECTED Final   Metapneumovirus NOT DETECTED NOT DETECTED Final   Rhinovirus / Enterovirus NOT DETECTED NOT DETECTED Final   Influenza A NOT DETECTED NOT DETECTED Final   Influenza B NOT DETECTED NOT DETECTED Final   Parainfluenza Virus 1 NOT DETECTED NOT DETECTED Final   Parainfluenza Virus 2 NOT DETECTED NOT DETECTED Final   Parainfluenza Virus 3 NOT DETECTED NOT DETECTED Final   Parainfluenza Virus 4 NOT DETECTED NOT DETECTED Final   Respiratory Syncytial  Virus NOT DETECTED NOT DETECTED Final   Bordetella pertussis NOT DETECTED NOT DETECTED Final   Bordetella Parapertussis NOT DETECTED NOT DETECTED Final   Chlamydophila pneumoniae NOT DETECTED NOT DETECTED Final   Mycoplasma pneumoniae NOT DETECTED NOT DETECTED Final    Comment: Performed at [facility], [address] [place], [place] [address]  Resp panel by RT-PCR (RSV, Flu A&B, Covid) Anterior Nasal Swab     Status: None   Collection Time: 08/13/24 12:31 PM   Specimen: Anterior Nasal Swab  Result Value Ref Range Status   SARS Coronavirus 2 by RT PCR NEGATIVE NEGATIVE Final    Comment: (NOTE) SARS-CoV-2 target nucleic acids are NOT DETECTED.  The SARS-CoV-2 RNA is generally detectable in upper respiratory specimens during the acute phase of infection. The lowest concentration of SARS-CoV-2 viral copies this assay can detect is 138 copies/mL. A negative result does not preclude SARS-Cov-2 infection and should not be used as the sole basis for treatment or other patient management decisions. A negative result may occur with  improper specimen collection/handling, submission of specimen other than nasopharyngeal swab, presence of viral mutation(s) within the areas targeted by this assay, and inadequate number of viral copies(<138 copies/mL). A negative result must be combined with clinical observations, patient history, and epidemiological information. The expected result is Negative.  Fact Sheet for Patients:  bloggercourse.com  Fact Sheet for Healthcare Providers:  seriousbroker.it  This test is no t yet approved or cleared by the [place]  FDA and  has been authorized for detection and/or diagnosis of SARS-CoV-2 by FDA under an Emergency Use Authorization (EUA). This EUA will remain  in effect (meaning this test can be used) for the duration of the COVID-19 declaration under Section 564(b)(1) of the Act,  21 U.S.C.section 360bbb-3(b)(1), unless the authorization is terminated  or revoked sooner.       Influenza A by PCR NEGATIVE NEGATIVE Final   Influenza B by PCR NEGATIVE NEGATIVE Final    Comment: (NOTE) The Xpert Xpress SARS-CoV-2/FLU/RSV plus assay is intended as an aid in the diagnosis of influenza from Nasopharyngeal swab specimens and should not be used as a sole basis for treatment. Nasal washings and aspirates are unacceptable for Xpert Xpress SARS-CoV-2/FLU/RSV testing.  Fact Sheet for Patients: bloggercourse.com  Fact Sheet for Healthcare Providers: seriousbroker.it  This test is not yet approved or cleared by the [place]  FDA and has been authorized for detection and/or diagnosis of SARS-CoV-2 by FDA under an Emergency Use Authorization (EUA). This EUA will remain in effect (meaning this test can be used) for the duration of the COVID-19 declaration under Section 564(b)(1) of the Act, 21 U.S.C. section 360bbb-3(b)(1), unless the authorization is terminated or revoked.     Resp Syncytial Virus by PCR NEGATIVE NEGATIVE Final    Comment: (NOTE) Fact Sheet for  Patients: bloggercourse.com  Fact Sheet for Healthcare Providers: seriousbroker.it  This test is not yet approved or cleared by the [place]  FDA and has been authorized for detection and/or diagnosis of SARS-CoV-2 by FDA under an Emergency Use Authorization (EUA). This EUA will remain in effect (meaning this test can be used) for the duration of the COVID-19 declaration under Section 564(b)(1) of the Act, 21 U.S.C. section 360bbb-3(b)(1), unless the authorization is terminated or revoked.  Performed at [facility], [address], [place], [place] [address]   Culture, blood (Routine X 2) w Reflex to ID Panel     Status: None (Preliminary result)   Collection Time: 08/14/24 10:28  AM   Specimen: BLOOD  Result Value Ref Range Status   Specimen Description   Final    BLOOD BLOOD LEFT ARM Performed at [facility], [address] [place], [place] [address]    Special Requests   Final    BOTTLES DRAWN AEROBIC AND ANAEROBIC Blood Culture adequate volume Performed at [facility], [address], [place], [place] [address]    Culture   Final    NO GROWTH 3 DAYS Performed at [facility], [address] [place], [place] [address]    Report Status PENDING  Incomplete  Culture, blood (Routine X 2) w Reflex to ID Panel     Status: None (Preliminary result)   Collection Time: 08/14/24 10:34 AM   Specimen: BLOOD  Result Value Ref Range Status   Specimen Description   Final    BLOOD BLOOD LEFT HAND Performed at [facility], [address], [place], [place] [address]    Special Requests   Final    BOTTLES DRAWN AEROBIC ONLY Blood Culture adequate volume Performed at [facility], [address] [place], [place] [address]    Culture   Final    NO GROWTH 3 DAYS Performed at [facility], [address], [place], [place] [address]    Report Status PENDING  Incomplete     Time coordinating discharge: Over 35 minutes  SIGNED:   Erle Odell Castor, MD  [facility] 08/17/2024, 10:08 AM Pager   If 7PM-7AM, please contact night-coverage [email or web address] Password [email or web address]

## 2024-08-17 NOTE — Progress Notes (Signed)
 During bedside report at shift change, patient reported that she fell earlier in the bathroom and didn't tell anyone at the time. She reported that she landed on her hip and did not hit her head. Provider notified of this at 1937, as well as of patient having orthostatic hypotension earlier and that she was receiving a bolus now for that. Per provider fall precautions were initiated and bed alarm was turned on. Patient provided education on ways to prevent falls and to always call for assistance with ambulation. Patient agreed to this. At 2323 the provider was notified that patient was reporting dizziness and lightheadedness, but that BP was WNL. Provider ordered orthostatic BPx1 on next set of vitals. At 0259 provider notified that standing BP was 77/51 and that patient was requesting pain medication for severe pain. Provider ordered to give Robaxin  and check BP in one hour. If BP was WNL then give the PO Dilaudid . At 0310 blood pressure was 90/48 while in lying position. Another bolus was ordered and when 500cc was left her blood pressure was 108/79. Per provider bolus was increased from 250 mL/hr to 999 mL/hr and Dilaudid  was given. Patient provided fall risk education multiple times through the night.

## 2024-08-17 NOTE — Progress Notes (Signed)
 Discharge medications delivered to patient at the bedside.

## 2024-08-17 NOTE — Plan of Care (Signed)
   Problem: Nutrition: Goal: Adequate nutrition will be maintained Outcome: Progressing   Problem: Elimination: Goal: Will not experience complications related to urinary retention Outcome: Progressing

## 2024-08-17 NOTE — Progress Notes (Signed)
     Patient Name: LAURINA FISCHL           DOB: 1976-08-08  MRN: [id number]      Admission Date: 08/09/2024  Attending Provider: Odell Celinda Balo, MD  Primary Diagnosis: Nausea & vomiting   Level of care: Telemetry   OVERNIGHT EVENT   Patient reported she had a fall during daytime. Unwitnessed.   She denies pain or hitting head during fall. No loss of consciousness. Felt lightheaded prior to fall.  No obvious injuries reported by RN. She is A/O x 4 with hemodynamically stable vitals.     Plan: Recently received IVF bolus. Will check ortho vitals.  Fall precautions   Lavanda Horns, DNP, ACNPC- AG [facility] [place]

## 2024-08-19 LAB — CULTURE, BLOOD (ROUTINE X 2)
Culture: NO GROWTH
Culture: NO GROWTH
Special Requests: ADEQUATE
Special Requests: ADEQUATE

## 2024-08-21 ENCOUNTER — Encounter (HOSPITAL_COMMUNITY): Admission: EM | Disposition: A | Discharge status: Home / Self Care | Payer: Self-pay | Source: Home / Self Care

## 2024-08-21 ENCOUNTER — Inpatient Hospital Stay (HOSPITAL_COMMUNITY): Admitting: Anesthesiology

## 2024-08-21 ENCOUNTER — Inpatient Hospital Stay (HOSPITAL_COMMUNITY)
Admission: EM | Admit: 2024-08-21 | Discharge: 2024-08-30 | DRG: 329 | Disposition: A | Discharge status: Home / Self Care | Attending: General Surgery | Admitting: General Surgery

## 2024-08-21 ENCOUNTER — Other Ambulatory Visit (HOSPITAL_COMMUNITY): Payer: Self-pay

## 2024-08-21 ENCOUNTER — Emergency Department (HOSPITAL_COMMUNITY)

## 2024-08-21 ENCOUNTER — Other Ambulatory Visit: Payer: Self-pay

## 2024-08-21 ENCOUNTER — Encounter (HOSPITAL_COMMUNITY): Payer: Self-pay

## 2024-08-21 DIAGNOSIS — K255 Chronic or unspecified gastric ulcer with perforation: Secondary | ICD-10-CM | POA: Diagnosis present

## 2024-08-21 DIAGNOSIS — K285 Chronic or unspecified gastrojejunal ulcer with perforation: Secondary | ICD-10-CM

## 2024-08-21 DIAGNOSIS — Z87891 Personal history of nicotine dependence: Secondary | ICD-10-CM

## 2024-08-21 DIAGNOSIS — T81320A Disruption or dehiscence of gastrointestinal tract anastomosis, repair, or closure, initial encounter: Principal | ICD-10-CM

## 2024-08-21 DIAGNOSIS — F332 Major depressive disorder, recurrent severe without psychotic features: Secondary | ICD-10-CM | POA: Diagnosis present

## 2024-08-21 DIAGNOSIS — E440 Moderate protein-calorie malnutrition: Secondary | ICD-10-CM | POA: Diagnosis present

## 2024-08-21 DIAGNOSIS — F418 Other specified anxiety disorders: Secondary | ICD-10-CM

## 2024-08-21 DIAGNOSIS — R519 Headache, unspecified: Secondary | ICD-10-CM | POA: Diagnosis present

## 2024-08-21 DIAGNOSIS — R2681 Unsteadiness on feet: Secondary | ICD-10-CM | POA: Diagnosis present

## 2024-08-21 DIAGNOSIS — G8929 Other chronic pain: Secondary | ICD-10-CM | POA: Diagnosis present

## 2024-08-21 DIAGNOSIS — J45909 Unspecified asthma, uncomplicated: Secondary | ICD-10-CM | POA: Diagnosis not present

## 2024-08-21 DIAGNOSIS — D529 Folate deficiency anemia, unspecified: Secondary | ICD-10-CM

## 2024-08-21 DIAGNOSIS — M4300 Spondylolysis, site unspecified: Secondary | ICD-10-CM | POA: Diagnosis present

## 2024-08-21 DIAGNOSIS — K5909 Other constipation: Secondary | ICD-10-CM | POA: Diagnosis present

## 2024-08-21 DIAGNOSIS — Z87898 Personal history of other specified conditions: Secondary | ICD-10-CM

## 2024-08-21 HISTORY — PX: LAPAROSCOPY: SHX197

## 2024-08-21 LAB — URINALYSIS, ROUTINE W REFLEX MICROSCOPIC
Bilirubin Urine: NEGATIVE
Glucose, UA: NEGATIVE mg/dL
Hgb urine dipstick: NEGATIVE
Ketones, ur: NEGATIVE mg/dL
Leukocytes,Ua: NEGATIVE
Nitrite: NEGATIVE
Protein, ur: 100 mg/dL — AB
Specific Gravity, Urine: >1.046 — ABNORMAL HIGH (ref 1.005–1.030)
pH: 5 (ref 5.0–8.0)

## 2024-08-21 LAB — COMPREHENSIVE METABOLIC PANEL WITH GFR
ALT: 6 U/L (ref 0–44)
AST: 18 U/L (ref 15–41)
Albumin: 3.6 g/dL (ref 3.5–5.0)
Alkaline Phosphatase: 60 U/L (ref 38–126)
Anion gap: 11 (ref 5–15)
BUN: 17 mg/dL (ref 6–20)
CO2: 25 mmol/L (ref 22–32)
Calcium: 9.7 mg/dL (ref 8.9–10.3)
Chloride: 101 mmol/L (ref 98–111)
Creatinine, Ser: 0.79 mg/dL (ref 0.44–1.00)
GFR, Estimated: >60 mL/min (ref >60)
Glucose, Bld: 168 mg/dL — ABNORMAL HIGH (ref 70–99)
Potassium: 4.1 mmol/L (ref 3.5–5.1)
Sodium: 137 mmol/L (ref 135–145)
Total Bilirubin: 0.5 mg/dL (ref 0.0–1.2)
Total Protein: 6.9 g/dL (ref 6.5–8.1)

## 2024-08-21 LAB — CBC WITH DIFFERENTIAL/PLATELET
Abs Immature Granulocytes: 0.16 K/uL — ABNORMAL HIGH (ref 0.00–0.07)
Basophils Absolute: 0.1 K/uL (ref 0.0–0.1)
Basophils Relative: 0 %
Eosinophils Absolute: 0 K/uL (ref 0.0–0.5)
Eosinophils Relative: 0 %
HCT: 43.9 % (ref 36.0–46.0)
Hemoglobin: 14 g/dL (ref 12.0–15.0)
Immature Granulocytes: 1 %
Lymphocytes Relative: 3 %
Lymphs Abs: 0.9 K/uL (ref 0.7–4.0)
MCH: 30.8 pg (ref 26.0–34.0)
MCHC: 31.9 g/dL (ref 30.0–36.0)
MCV: 96.7 fL (ref 80.0–100.0)
Monocytes Absolute: 0.8 K/uL (ref 0.1–1.0)
Monocytes Relative: 3 %
Neutro Abs: 24.4 K/uL — ABNORMAL HIGH (ref 1.7–7.7)
Neutrophils Relative %: 93 %
Platelets: 525 K/uL — ABNORMAL HIGH (ref 150–400)
RBC: 4.54 MIL/uL (ref 3.87–5.11)
RDW: 14.7 % (ref 11.5–15.5)
WBC: 26.3 K/uL — ABNORMAL HIGH (ref 4.0–10.5)
nRBC: 0 % (ref 0.0–0.2)

## 2024-08-21 LAB — I-STAT CG4 LACTIC ACID, ED: Lactic Acid, Venous: 2.4 mmol/L (ref 0.5–1.9)

## 2024-08-21 LAB — LIPASE, BLOOD: Lipase: 15 U/L (ref 11–51)

## 2024-08-21 LAB — TYPE AND SCREEN
ABO/RH(D): O POS
Antibody Screen: NEGATIVE

## 2024-08-21 LAB — LACTIC ACID, PLASMA: Lactic Acid, Venous: 3.9 mmol/L (ref 0.5–1.9)

## 2024-08-21 SURGERY — LAPAROSCOPY, DIAGNOSTIC
Anesthesia: General

## 2024-08-21 MED ORDER — CARMEX CLASSIC LIP BALM EX OINT
TOPICAL_OINTMENT | CUTANEOUS | Status: DC | PRN
  Filled 2024-08-21: qty 10

## 2024-08-21 MED ORDER — PROPOFOL 10 MG/ML IV BOLUS
INTRAVENOUS | Status: DC | PRN
  Administered 2024-08-21: 160 mg via INTRAVENOUS

## 2024-08-21 MED ORDER — SUGAMMADEX SODIUM 200 MG/2ML IV SOLN
INTRAVENOUS | Status: DC | PRN
  Administered 2024-08-21: 200 mg via INTRAVENOUS

## 2024-08-21 MED ORDER — 0.9 % SODIUM CHLORIDE (POUR BTL) OPTIME
TOPICAL | Status: DC | PRN
  Administered 2024-08-21: 1000 mL

## 2024-08-21 MED ORDER — BUPIVACAINE-EPINEPHRINE (PF) 0.25% -1:200000 IJ SOLN
INTRAMUSCULAR | Status: AC
  Filled 2024-08-21: qty 30

## 2024-08-21 MED ORDER — ROCURONIUM BROMIDE 10 MG/ML (PF) SYRINGE
PREFILLED_SYRINGE | INTRAVENOUS | Status: DC | PRN
  Administered 2024-08-21: 50 mg via INTRAVENOUS

## 2024-08-21 MED ORDER — METRONIDAZOLE 500 MG/100ML IV SOLN
500.0000 mg | Freq: Two times a day (BID) | INTRAVENOUS | Status: DC
  Administered 2024-08-21 – 2024-08-26 (×10): 500 mg via INTRAVENOUS
  Filled 2024-08-21 (×10): qty 100

## 2024-08-21 MED ORDER — DIPHENHYDRAMINE HCL 50 MG/ML IJ SOLN
25.0000 mg | Freq: Once | INTRAMUSCULAR | Status: AC
  Administered 2024-08-21: 25 mg via INTRAVENOUS
  Filled 2024-08-21: qty 1

## 2024-08-21 MED ORDER — SODIUM CHLORIDE 0.9% FLUSH
10.0000 mL | INTRAVENOUS | Status: DC | PRN
  Administered 2024-08-29: 10 mL

## 2024-08-21 MED ORDER — METRONIDAZOLE 500 MG/100ML IV SOLN: 500.0000 mg | Freq: Two times a day (BID) | INTRAVENOUS | Status: DC

## 2024-08-21 MED ORDER — HYDROMORPHONE HCL 1 MG/ML IJ SOLN
1.0000 mg | Freq: Once | INTRAMUSCULAR | Status: AC
  Administered 2024-08-21: 1 mg via INTRAVENOUS
  Filled 2024-08-21: qty 1

## 2024-08-21 MED ORDER — FENTANYL CITRATE (PF) 100 MCG/2ML IJ SOLN
INTRAMUSCULAR | Status: AC
  Filled 2024-08-21: qty 2

## 2024-08-21 MED ORDER — DROPERIDOL 2.5 MG/ML IJ SOLN
1.2500 mg | Freq: Once | INTRAMUSCULAR | Status: AC
  Administered 2024-08-21: 1.25 mg via INTRAVENOUS
  Filled 2024-08-21: qty 2

## 2024-08-21 MED ORDER — DEXAMETHASONE SOD PHOSPHATE PF 10 MG/ML IJ SOLN
INTRAMUSCULAR | Status: DC | PRN
  Administered 2024-08-21: 10 mg via INTRAVENOUS

## 2024-08-21 MED ORDER — LACTATED RINGERS IR SOLN
Status: DC | PRN
  Administered 2024-08-21 (×3): 1000 mL

## 2024-08-21 MED ORDER — METRONIDAZOLE 500 MG/100ML IV SOLN
500.0000 mg | Freq: Once | INTRAVENOUS | Status: AC
  Administered 2024-08-21: 500 mg via INTRAVENOUS
  Filled 2024-08-21: qty 100

## 2024-08-21 MED ORDER — KETAMINE HCL 10 MG/ML IJ SOLN
INTRAMUSCULAR | Status: DC | PRN
  Administered 2024-08-21: 30 mg via INTRAVENOUS

## 2024-08-21 MED ORDER — MIDAZOLAM HCL 5 MG/5ML IJ SOLN
INTRAMUSCULAR | Status: DC | PRN
  Administered 2024-08-21: 2 mg via INTRAVENOUS

## 2024-08-21 MED ORDER — FENTANYL CITRATE (PF) 50 MCG/ML IJ SOSY
PREFILLED_SYRINGE | INTRAMUSCULAR | Status: AC
  Administered 2024-08-21: 50 ug via INTRAVENOUS
  Filled 2024-08-21: qty 1

## 2024-08-21 MED ORDER — HEPARIN SODIUM (PORCINE) 5000 UNIT/ML IJ SOLN: 5000.0000 [IU] | Freq: Three times a day (TID) | INTRAMUSCULAR | Status: DC

## 2024-08-21 MED ORDER — LACTATED RINGERS IV SOLN: INTRAVENOUS | Status: DC

## 2024-08-21 MED ORDER — ONDANSETRON 4 MG PO TBDP
4.0000 mg | ORAL_TABLET | Freq: Four times a day (QID) | ORAL | Status: DC | PRN
  Administered 2024-08-26 – 2024-08-30 (×2): 4 mg via ORAL
  Filled 2024-08-21 (×2): qty 1

## 2024-08-21 MED ORDER — PHENYLEPHRINE 80 MCG/ML (10ML) SYRINGE FOR IV PUSH (FOR BLOOD PRESSURE SUPPORT)
PREFILLED_SYRINGE | INTRAVENOUS | Status: DC | PRN
  Administered 2024-08-21 (×4): 240 ug via INTRAVENOUS

## 2024-08-21 MED ORDER — SODIUM CHLORIDE 0.9 % IV SOLN: 2.0000 g | Freq: Three times a day (TID) | INTRAVENOUS | Status: DC

## 2024-08-21 MED ORDER — FENTANYL CITRATE (PF) 100 MCG/2ML IJ SOLN
INTRAMUSCULAR | Status: DC | PRN
  Administered 2024-08-21: 50 ug via INTRAVENOUS
  Administered 2024-08-21: 100 ug via INTRAVENOUS
  Administered 2024-08-21: 50 ug via INTRAVENOUS

## 2024-08-21 MED ORDER — ONDANSETRON HCL 4 MG/2ML IJ SOLN
4.0000 mg | Freq: Four times a day (QID) | INTRAMUSCULAR | Status: DC | PRN
  Administered 2024-08-21 – 2024-08-28 (×9): 4 mg via INTRAVENOUS
  Filled 2024-08-21 (×9): qty 2

## 2024-08-21 MED ORDER — PHENYLEPHRINE HCL-NACL 20-0.9 MG/250ML-% IV SOLN
INTRAVENOUS | Status: DC | PRN
  Administered 2024-08-21: 100 ug/min via INTRAVENOUS

## 2024-08-21 MED ORDER — FENTANYL CITRATE (PF) 50 MCG/ML IJ SOSY: 50.0000 ug | PREFILLED_SYRINGE | Freq: Once | INTRAMUSCULAR | Status: AC

## 2024-08-21 MED ORDER — SODIUM CHLORIDE 0.9 % IV SOLN
2.0000 g | Freq: Once | INTRAVENOUS | Status: AC
  Administered 2024-08-21: 2 g via INTRAVENOUS
  Filled 2024-08-21: qty 12.5

## 2024-08-21 MED ORDER — SODIUM CHLORIDE 0.9 % IV SOLN
2.0000 g | Freq: Three times a day (TID) | INTRAVENOUS | Status: AC
  Administered 2024-08-21 – 2024-08-26 (×15): 2 g via INTRAVENOUS
  Filled 2024-08-21 (×15): qty 12.5

## 2024-08-21 MED ORDER — ALBUMIN HUMAN 5 % IV SOLN
INTRAVENOUS | Status: AC
  Filled 2024-08-21: qty 500

## 2024-08-21 MED ORDER — SODIUM CHLORIDE 0.9 % IV SOLN: INTRAVENOUS | Status: DC

## 2024-08-21 MED ORDER — PHENYLEPHRINE HCL (PRESSORS) 10 MG/ML IV SOLN
INTRAVENOUS | Status: AC
Start: 2024-08-21 — End: 2024-08-21
  Filled 2024-08-21: qty 1

## 2024-08-21 MED ORDER — IOHEXOL 300 MG/ML  SOLN
100.0000 mL | Freq: Once | INTRAMUSCULAR | Status: AC | PRN
  Administered 2024-08-21: 100 mL via INTRAVENOUS

## 2024-08-21 MED ORDER — HEPARIN SODIUM (PORCINE) 5000 UNIT/ML IJ SOLN
5000.0000 [IU] | Freq: Three times a day (TID) | INTRAMUSCULAR | Status: DC
  Administered 2024-08-22 – 2024-08-30 (×26): 5000 [IU] via SUBCUTANEOUS
  Filled 2024-08-21 (×26): qty 1

## 2024-08-21 MED ORDER — SODIUM CHLORIDE 0.9 % IV BOLUS: 1000.0000 mL | Freq: Once | INTRAVENOUS | Status: DC

## 2024-08-21 MED ORDER — ONDANSETRON HCL 4 MG/2ML IJ SOLN
INTRAMUSCULAR | Status: DC | PRN
  Administered 2024-08-21: 4 mg via INTRAVENOUS

## 2024-08-21 MED ORDER — SODIUM CHLORIDE 0.9 % IV BOLUS
1000.0000 mL | Freq: Once | INTRAVENOUS | Status: AC
  Administered 2024-08-21: 1000 mL via INTRAVENOUS

## 2024-08-21 MED ORDER — PHENYLEPHRINE HCL (PRESSORS) 10 MG/ML IV SOLN
INTRAVENOUS | Status: AC
  Filled 2024-08-21: qty 1

## 2024-08-21 MED ORDER — BUPIVACAINE-EPINEPHRINE 0.25% -1:200000 IJ SOLN
INTRAMUSCULAR | Status: DC | PRN
  Administered 2024-08-21: 30 mL

## 2024-08-21 MED ORDER — LIDOCAINE 2% (20 MG/ML) 5 ML SYRINGE
INTRAMUSCULAR | Status: DC | PRN
  Administered 2024-08-21: 60 mg via INTRAVENOUS

## 2024-08-21 MED ORDER — MIDAZOLAM HCL 2 MG/2ML IJ SOLN
INTRAMUSCULAR | Status: AC
  Filled 2024-08-21: qty 2

## 2024-08-21 MED ORDER — PANTOPRAZOLE SODIUM 40 MG IV SOLR
40.0000 mg | Freq: Two times a day (BID) | INTRAVENOUS | Status: DC
  Administered 2024-08-21 – 2024-08-25 (×10): 40 mg via INTRAVENOUS
  Filled 2024-08-21 (×10): qty 10

## 2024-08-21 MED ORDER — HYDROMORPHONE HCL 1 MG/ML IJ SOLN
1.0000 mg | INTRAMUSCULAR | Status: DC | PRN
  Administered 2024-08-21 – 2024-08-22 (×7): 1 mg via INTRAVENOUS
  Filled 2024-08-21 (×7): qty 1

## 2024-08-21 MED ORDER — ALBUMIN HUMAN 5 % IV SOLN: INTRAVENOUS | Status: DC | PRN

## 2024-08-21 MED ORDER — KETAMINE HCL 50 MG/5ML IJ SOSY
PREFILLED_SYRINGE | INTRAMUSCULAR | Status: AC
  Filled 2024-08-21: qty 5

## 2024-08-21 MED ORDER — STERILE WATER FOR IRRIGATION IR SOLN
Status: DC | PRN
  Administered 2024-08-21: 1000 mL

## 2024-08-21 MED ORDER — SODIUM CHLORIDE 0.9 % IR SOLN
Status: DC | PRN
  Administered 2024-08-21: 3000 mL

## 2024-08-21 MED ORDER — EPHEDRINE SULFATE-NACL 50-0.9 MG/10ML-% IV SOSY
PREFILLED_SYRINGE | INTRAVENOUS | Status: DC | PRN
  Administered 2024-08-21: 10 mg via INTRAVENOUS

## 2024-08-21 SURGICAL SUPPLY — 51 items
BAG COUNTER SPONGE SURGICOUNT (BAG) IMPLANT
BLADE EXTENDED COATED 6.5IN (ELECTRODE) IMPLANT
BLADE SURG SZ10 CARB STEEL (BLADE) IMPLANT
CHLORAPREP W/TINT 26 (MISCELLANEOUS) ×1 IMPLANT
CLIP APPLIE 5 13 M/L LIGAMAX5 (MISCELLANEOUS) IMPLANT
CLIP APPLIE ROT 10 11.4 M/L (STAPLE) IMPLANT
COVER MAYO STAND STRL (DRAPES) IMPLANT
COVER SURGICAL LIGHT HANDLE (MISCELLANEOUS) ×1 IMPLANT
DERMABOND ADVANCED .7 DNX12 (GAUZE/BANDAGES/DRESSINGS) IMPLANT
DEVICE SUTURE ENDOST 10MM (ENDOMECHANICALS) IMPLANT
DRAIN CHANNEL 19F RND (DRAIN) IMPLANT
DRAPE WARM FLUID 44X44 (DRAPES) IMPLANT
DRSG TEGADERM 4X4.75 (GAUZE/BANDAGES/DRESSINGS) IMPLANT
ELECT REM PT RETURN 15FT ADLT (MISCELLANEOUS) ×1 IMPLANT
EVACUATOR SILICONE 100CC (DRAIN) IMPLANT
GAUZE SPONGE 4X4 12PLY STRL (GAUZE/BANDAGES/DRESSINGS) ×1 IMPLANT
GAUZE SPONGE 4X4 12PLY STRL LF (GAUZE/BANDAGES/DRESSINGS) IMPLANT
GLOVE BIO SURGEON STRL SZ7.5 (GLOVE) ×1 IMPLANT
GLOVE INDICATOR 8.0 STRL GRN (GLOVE) ×1 IMPLANT
GOWN STRL REUS W/ TWL XL LVL3 (GOWN DISPOSABLE) ×1 IMPLANT
GRASPER SUT TROCAR 14GX15 (MISCELLANEOUS) IMPLANT
HANDLE SUCTION POOLE (INSTRUMENTS) IMPLANT
IRRIGATION SUCT STRKRFLW 2 WTP (MISCELLANEOUS) IMPLANT
KIT BASIN OR (CUSTOM PROCEDURE TRAY) ×1 IMPLANT
KIT TURNOVER KIT A (KITS) ×1 IMPLANT
RELOAD SUT SNGL STCH BLK 2-0 (ENDOMECHANICALS) IMPLANT
RETRACTOR WND ALEXIS 18 MED (MISCELLANEOUS) IMPLANT
SCISSORS LAP 5X35 DISP (ENDOMECHANICALS) ×1 IMPLANT
SET TUBE SMOKE EVAC HIGH FLOW (TUBING) IMPLANT
SHEARS HARMONIC 36 ACE (MISCELLANEOUS) IMPLANT
SLEEVE ADV FIXATION 5X100MM (TROCAR) IMPLANT
SPIKE FLUID TRANSFER (MISCELLANEOUS) ×1 IMPLANT
STAPLER SKIN PROX 35W (STAPLE) IMPLANT
STRIP CLOSURE SKIN 1/2X4 (GAUZE/BANDAGES/DRESSINGS) IMPLANT
SUT ETHILON 2 0 PS N (SUTURE) IMPLANT
SUT SILK 2 0 SH CR/8 (SUTURE) IMPLANT
SUT SILK 2-0 18XBRD TIE 12 (SUTURE) IMPLANT
SUT SILK 3 0 SH CR/8 (SUTURE) IMPLANT
SUT SILK 3-0 18XBRD TIE 12 (SUTURE) IMPLANT
SUT VICRYL 0 UR6 27IN ABS (SUTURE) IMPLANT
SYR BULB IRRIG 60ML STRL (SYRINGE) IMPLANT
SYSTEM LAPSCP GELPORT 120MM (MISCELLANEOUS) IMPLANT
TOWEL OR DSP ST BLU DLX 10/PK (DISPOSABLE) ×1 IMPLANT
TRAY FOLEY MTR SLVR 14FR STAT (SET/KITS/TRAYS/PACK) IMPLANT
TRAY FOLEY MTR SLVR 16FR STAT (SET/KITS/TRAYS/PACK) IMPLANT
TRAY LAPAROSCOPIC (CUSTOM PROCEDURE TRAY) ×1 IMPLANT
TROCAR 11X100 Z THREAD (TROCAR) IMPLANT
TROCAR ADV FIXATION 5X100MM (TROCAR) ×1 IMPLANT
TROCAR BALLN 12MMX100 BLUNT (TROCAR) IMPLANT
TROCAR XCEL NON-BLD 5MMX100MML (ENDOMECHANICALS) IMPLANT
YANKAUER SUCT BULB TIP NO VENT (SUCTIONS) IMPLANT

## 2024-08-21 NOTE — Interval H&P Note (Signed)
 History and Physical Interval Note:  08/21/2024 9:43 AM  [name]  has presented today for surgery, with the diagnosis of PERFORATED MARGINAL ULCER.  The various methods of treatment have been discussed with the patient and family. After consideration of risks, benefits and other options for treatment, the patient has consented to  Procedure(s) with comments: LAPAROSCOPY, DIAGNOSTIC (N/A) - DIAGNOSTIC LAPAROSCOPY, POSSIBLE EXPLORATORY LAPAROTOMY as a surgical intervention.  The patient's history has been reviewed, patient examined, no change in status, stable for surgery.  I have reviewed the patient's chart and labs.  Questions were answered to the patient's satisfaction.     Patient has a very long complicated bariatric surgical history.  Reviewed the last operative note from Dr. Mikell.  Also reviewed the 2 most recent upper endoscopies.  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  Unfortunately she started having marginal ulcers at her gastrojejunal anastomosis that failed medical management and she has already underwent 1 revision of her gastrojejunostomy before but yet continues to have ongoing ulcer disease.  Unfortunately she is presented with what appears to be a perforated marginal ulcer.  Today's goal will just to be to damage control, washout and drain placement and try to patch the repair.  It is not technically safe to revise her gastrojejunostomy today and or to reverse her gastric bypass because of the high risk of anastomotic leak.  Today we will simply be damage control procedure.  I discussed with the patient.  The patient states that she is not smoking.  However we will still send urine nicotine and cotinine levels to verify that.  Will attempt to do this laparoscopically but did discuss the risk of exploratory laparotomy.  We will  confirm the location of the perforated ulcer washout the abdomen leave drains and try to patch the ulcer.  Patient already has an indwelling gastrotomy tube in her gastric remnant.  There is a small possibility we may do an upper endoscopy today as well.  Discussed the risk of the procedure including not limited to bleeding, infection, injury to surrounding structures, ongoing leak, abscess formation, perioperative cardiac and pulmonary events, blood clot formation, need for additional procedures.  Patient states that she has been using her gastrotomy tube for nighttime cyclic feedings from about 10:00 at night to about 10 in the morning.  However she has been having vomiting so she has not been taking her multivitamins are able to keep them down.  We will give her some IV thiamine  and check vitamin levels while she is here as well  [name]

## 2024-08-21 NOTE — Brief Op Note (Signed)
 08/21/2024  3:13 PM  PATIENT:  [name]  48 y.o. female  PRE-OPERATIVE DIAGNOSIS:  PERFORATED MARGINAL ULCER  POST-OPERATIVE DIAGNOSIS:  PERFORATED MARGINAL ULCER  PROCEDURE:  Procedure(s) with comments: LAPAROSCOPY, DIAGNOSTIC, LAPAROSCOPIC GRAM PATCH OF ULCER (N/A) - DIAGNOSTIC LAPAROSCOPY  SURGEON:  Surgeons and Role:    DEWAINE Tanda Locus, MD - Primary    *   PHYSICIAN ASSISTANT:   ASSISTANTS: Margretta Brash MD PGY-3   ANESTHESIA:   general  EBL:  10 mL   BLOOD ADMINISTERED:none  DRAINS: (19) Jackson-Pratt drain(s) with closed bulb suction in the LUQ and Urinary Catheter (Foley)   LOCAL MEDICATIONS USED:  MARCAINE      SPECIMEN:  No Specimen  DISPOSITION OF SPECIMEN:  N/A  COUNTS:  YES  TOURNIQUET:  * No tourniquets in log *  DICTATION: .Dragon Dictation  PLAN OF CARE: Admit to inpatient   PATIENT DISPOSITION:  PACU - guarded condition.   Delay start of Pharmacological VTE agent (>24hrs) due to surgical blood loss or risk of bleeding: no

## 2024-08-21 NOTE — ED Notes (Signed)
 Pt reported being unable to pee and poo for x2 days. Bladder scan showed 

## 2024-08-21 NOTE — ED Triage Notes (Signed)
 BIBA from home, reports bilateral shoulder pain and tenderness for 10 days.  Seen 2 days ago and given meds for pain.  Refused to take the meds until she saw her PCP, pain got worse.  PCP appointment today at 1100.  She doesn't know what the meds are that were prescribed.  148/60 102 HR 18 RR 100% RA 158 CBG

## 2024-08-21 NOTE — Op Note (Signed)
 08/21/2024  4:09 PM  PATIENT:  [name]  48 y.o. 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  POST-OPERATIVE DIAGNOSIS: Same  PROCEDURE:  Procedure(s): LAPAROSCOPY, DIAGNOSTIC,  LAPAROSCOPIC GRAM PATCH REPAIR OF PERFORATED MARGINAL ULCER  SURGEON:  Surgeon(s): Tanda Locus, MD FACS  ASSISTANTS: Margretta Brash MD PGY-3; Krystal Spinner, MD, FACS  ANESTHESIA:   general  EBL: min  FINDINGS: Feculent contamination throughout abdomen, about a 3 cm perforated marginal ulcer mostly on the jejunal aspect of the gastrojejunostomy  DRAINS: (61fr ) Jackson-Pratt drain(s) with closed bulb suction in the left upper quadrant, 1 drain on top of the Graham patch repair, the other drain is right underneath the omental patch, Urinary Catheter (Foley), and Gastrostomy Tube   LOCAL MEDICATIONS USED:  MARCAINE      SPECIMEN:  No Specimen  DISPOSITION OF SPECIMEN:  N/A  COUNTS:  YES  INDICATION FOR PROCEDURE: 48 year old female who initially had a laparoscopic sleeve gastrectomy in April 2017 by Dr. Mikell who underwent revision to a Roux-en-Y gastric bypass with takedown of a feeding jejunostomy in March 2018 due to refractory GERD.  She subsequently developed persistent marginal ulcers that did not heal despite maximal medical therapy.  She underwent revision and resection of her gastrojejunostomy with placement of a gastrotomy tube in January 2019 by Dr. Mikell.  She developed recurrent marginal ulcer disease and has undergone multiple endoscopies both locally as well as at [facility].  She had a gastrotomy tube placed in her excluded stomach by Dr. Lyndel in December 2024.  She has had ongoing issues with marginal ulcer.  She came into the ER early this morning with abdominal pain, tachycardia and a white blood cell count of 26,000.  CT showed perforated gastrojejunostomy with free fluid.  I  discussed the need for laparoscopy and possible ex lap with the patient.  We discussed that given the perforation and the current clinical condition that a definitive procedure would not be able to be done due to high risk for failure therefore we could only do a washout and patch the the perforation.  Patient already had an indwelling G-tube in her gastric remnant.  Risk and benefits were discussed and separately dictated.  I did discuss the case with my surgical colleague at [facility] Dr. Lucille who agreed with our operative plan  PROCEDURE: Patient was taken to the OR four at [facility] and placed supine on operating table.  General endotracheal anesthesia was established.  Sequential compression devices were placed.  A Foley catheter was placed.  Her abdomen was prepped and draped in the usual standard surgical fashion with ChloraPrep.  The gastrotomy tube was prepped with Betadine .  Her gastrotomy tube was essentially in her upper midline.  A surgical timeout was performed.  She was on scheduled therapeutic antibiotics.  A small incision was made in the supraumbilical position.  Subcutaneous tissue was divided.  The fascia was carefully incised and a pursestring suture consisting of 0 Vicryl UR 6 was placed around the fascial edges.  A 12 mm Hassan trocar was placed and pneumoperitoneum was smoothly established up to a patient pressure of 15 mmHg.  The laparoscope was advanced and abdominal cavity was surveilled.  There was significant intra-abdominal feculent contamination of murky gray-brown fluid throughout the abdomen..  There was fibrinous exudate on some of the loops of the bowel.  There was inflammatory rind on the peritoneum.  We initially placed 2 additional 5  mm trocars in the right abdomen all under direct visualization.  I started evacuating the contaminated fluid from the abdomen.  We visualized the gastric remnant being pulled up to the abdominal wall in the midline with the gastric tube  in the gastric remnant.  We ended up placing a Nathanson liver retractor through a subxiphoid retractor and I was able to lift up the left lobe of the liver.  There was some omentum already scarred down over the gastric pouch and Roux limb.  I was able to lifted up and we visualized perforated ulcer.  It was mostly on the jejunal aspect of the gastrojejunostomy.  It appeared to be about a 3 cm perforation.  I ended up upsizing the mid right abdominal 5 mm trocar to an 11 mm trocar.  I irrigated the abdomen with a total of 7 L of saline in all quadrants.  Then I took down some of the omentum from the anterior abdominal wall with scissors with electrocautery.  We were able to swing some more of the omentum over the perforation.  Then using an Endo Stitch with a 2-0 silk Surgidac I took a bite of the open jejunum with omentum and then took a bite of the contralateral side of the defect of the jejunum on the Roux limb I then tied that down.  I then placed a 2 additional 2-0 silk Endo Stitch sutures tacking the omentum down to the Roux limb covering the area of perforation.  Prior to doing this we really cannot visualize much of the gastric pouch.  The area of perforation appear well covered.  I brought in a 38 French drain and placed one of the drains underneath the omental patch and brought it out through the right abdominal wall.  Another drain was placed on top of the patch repair and brought out through the right abdominal wall through a second incision.  The drains were secured to the skin with a 2-0 nylon.  We cinched down the gastrotomy tube to about 3 cm on the skin level.  Again it appeared to be intragastric in the gastric remnant.  The 11 mm trocar over some removed and the fascial defect was closed with a single interrupted 0 Vicryl with a PMI suture passer.  The [facility] trocar was removed and the previously placed pursestring suture was tied down.  We placed an additional 0 Vicryl suture to reinforce that  fascial repair.  Liver retractor had been removed prior to us  closing the supraumbilical incision.  Skin incisions were closed with a 4 Monocryl followed by the application of Dermabond.  All needle, instrument, and sponge counts were correct x 2.  There were no immediate complications.  The patient was extubated and taken to the recovery room.  CASE DATA: Type of patient?: LDOW CASE (Surgical Hospitalist [facility] Inpatient) Status of Case? URGENT Add On Infection Present At Time Of Surgery (PATOS)?  FECULENT PERITONITIS  PLAN OF CARE: Admit to inpatient   PATIENT DISPOSITION:  PACU - hemodynamically stable.   Delay start of Pharmacological VTE agent (>24hrs) due to surgical blood loss or risk of bleeding:  no  I was personally present scrubbed and performed all parts of the procedure except for skin closure which was done by the resident but I was immediately available as documented in my operative note.   Camellia HERO. Tanda, MD, FACS General, Bariatric, & Minimally Invasive Surgery [facility]

## 2024-08-21 NOTE — Anesthesia Procedure Notes (Signed)
 Procedure Name: Intubation Date/Time: 08/21/2024 2:00 PM  Performed by: Cindie Charleen PARAS, CRNAPre-anesthesia Checklist: Patient identified, Emergency Drugs available, Suction available, Patient being monitored and Timeout performed Patient Re-evaluated:Patient Re-evaluated prior to induction Oxygen Delivery Method: Circle system utilized Induction Type: IV induction Ventilation: Mask ventilation without difficulty Laryngoscope Size: Mac and 4 Grade View: Grade I Tube type: Oral Tube size: 7.0 mm Number of attempts: 1 Airway Equipment and Method: Stylet Placement Confirmation: ETT inserted through vocal cords under direct vision, positive ETCO2 and breath sounds checked- equal and bilateral Secured at: 21 cm Tube secured with: Tape Dental Injury: Teeth and Oropharynx as per pre-operative assessment

## 2024-08-21 NOTE — Anesthesia Postprocedure Evaluation (Signed)
 Anesthesia Post Note  Patient: [name]  Procedure(s) Performed: LAPAROSCOPY, DIAGNOSTIC, LAPAROSCOPIC GRAM PATCH OF ULCER     Patient location during evaluation: PACU Anesthesia Type: General Level of consciousness: awake and alert Pain management: pain level controlled Vital Signs Assessment: post-procedure vital signs reviewed and stable Respiratory status: spontaneous breathing, nonlabored ventilation, respiratory function stable and patient connected to nasal cannula oxygen Cardiovascular status: blood pressure returned to baseline and stable Postop Assessment: no apparent nausea or vomiting Anesthetic complications: no   No notable events documented.  Last Vitals:  Vitals:   08/21/24 1700 08/21/24 1810  BP: 127/83 118/79  Pulse: (!) 102 98  Resp: 17 18  Temp: 37.2 C 36.8 C  SpO2:  99%    Last Pain:  Vitals:   08/21/24 1810  TempSrc: Oral  PainSc:                  Garnette DELENA Gab

## 2024-08-21 NOTE — H&P (Signed)
 [name] is an 48 y.o. female.   Chief Complaint: ab pain HPI: 64 yof with complicated weight loss surgery history. She has had numerous issues with marginal ulcers. Her last admission was earlier this month for ab pain. She underwent an EGD that showed a large anastomotic ulcer with adherent clots.  She was discharged home with ab pain she says.  This pain continued but got worse a couple days ago and eventually she asked her son to bring her here. Denies fevers. She is asking for po. It is difficult to get history out of her now as she just wants to drink something.   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 Mikell  - Laparoscopic jejunostomy tube placement 05/11/2016 - Dr. 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 Mikell for nausea and vomiting after sleeve gastrectomy  - EGD on 03/02/2017 - Dr. Ethyl noted a two superficial marginal ulcers at the gastrojejunostomy  - EGD on 05/20/2017 - Dr. Ethyl noted persistent superficial marginal ulcers  - EGD on 10/07/2017 - Dr. Ethyl noted persistent marginal ulcer  - Revision and resection of her gastrojejunostomy with gastrostomy tube placement 10/25/2017 - Dr. Mikell  Approximately 1/2 of her pouch was resected, and a centimeter or two of her roux limb was resected. The GJ was performed in the typical fashion with a posterior layer of vicry, a 45mm blue load to create the anastomosis, 2-0 vicryl closure of the enterotomy and 2-0 vicryl anterior outer layer. During the first attempt at firing the stapler, the anvil was located behind the roux limb and not in the lumen, so everything was taken down and redone. A g tube was placed in the remnant stomach (previously distal sleeve) medial to the roux limb.  - EGD on 01/20/23 - Dr. Saintclair noted non  bleeding gastric and jejunal ulcers (biopsy negative for H. Pylori)  - EGD on 07/25/23 - Dr. Kriss noted oozing marginal ulcers treated with bipolar cautery  -Excluded stomach gastrostomy tube 09/14/23-Dr. Stechschulte.  -10/01/23-10/06/23: hospitalized for bleeding marginal ulcer  -08/09/24-08/17/24 hospitalized for ab pain, marginal ulcer, EGD showed same  She has undergone evaluation in the ER that shows tachycardia at times (current 93). WBC of 26.3.  CT scan shows remnant g tube, perforated GJ tube likely at ulcer site. There is air/fluid next to this consistent with that as well as fair amount free fluid.    Past Medical History:  Diagnosis Date   Anal fissure - posterior 10/16/2014    OCC  ISSUES   Anxiety    doesn't take anything   Asthma    has Albuterol  inhaler as needed   Bronchitis    in winter   Chronic headache disorder 07/22/2016   Clostridium difficile infection 04/20/2012   Dehydration    Depression    Family history of adverse reaction to anesthesia    pt mom gets sick   GERD (gastroesophageal reflux disease)    takes Pantoprazole  daily   Headache    Hiatal hernia    neuropathy - mild in arms and legs   History of blood transfusion    last transfusion was 04/04/2016=Benadryl  was given d/t itching. States she always itches with transfusion.    History of migraine    last one 05/01/16   History of stomach ulcers    History of urinary tract infection  LAST 2 WEEKS AGO   IDA (iron  deficiency anemia)    Internal and external bleeding hemorrhoids 06/11/2014   Joint pain    Joint swelling    Left sided chronic colitis - segmental 06/11/2014   Marginal ulcer 10/25/2017   Motion sickness    Nausea    takes Zofran  as needed   Nausea and vomiting    for 1 year   Oligouria    Osteoarthritis    lower back, knees, wrists - no meds   Peripheral neuropathy 03/01/2019   Personal history of gastric bypass    Postoperative nausea and vomiting 01/21/2016   wants  scopolamine  patch   Right carpal tunnel syndrome 03/01/2019   SVD (spontaneous vaginal delivery)    x 4   TOBACCO USER 10/02/2009   Qualifier: Diagnosis of  By: Lida MD, Artist     Tremor 08/31/2018   UC (ulcerative colitis) (HCC)    supposed to be taking Lialda  and Bentyl  but has been off since gastric sleeve   Unsteady gait when walking 2024   uses a walker   Vertigo    doesn't take any meds    Past Surgical History:  Procedure Laterality Date   ABDOMINAL HYSTERECTOMY     PARTIAL   BIOPSY  01/20/2023   Procedure: BIOPSY;  Surgeon: Saintclair Jasper, MD;  Location: [facility];  Service: Gastroenterology;;   BLADDER STIMULATOR IMPLANT     COLONOSCOPY  2007   for rectal bleeding; [facility]   COLONOSCOPY N/A 06/11/2014   Procedure: COLONOSCOPY;  Surgeon: Lupita FORBES Commander, MD;  [facility]: [facility];  Service: Endoscopy;  Laterality: N/A;   COLONOSCOPY WITH PROPOFOL  N/A 03/02/2017   Procedure: COLONOSCOPY WITH PROPOFOL ;  Surgeon: Ethyl Lenis, MD;  Location: [facility];  Service: General;  Laterality: N/A;   DILATION AND CURETTAGE OF UTERUS     ESOPHAGOGASTRODUODENOSCOPY N/A 07/25/2023   Procedure: ESOPHAGOGASTRODUODENOSCOPY (EGD);  Surgeon: Kriss Estefana DEL, DO;  Location: [facility];  Service: Gastroenterology;  Laterality: N/A;   ESOPHAGOGASTRODUODENOSCOPY N/A 10/02/2023   Procedure: ESOPHAGOGASTRODUODENOSCOPY (EGD);  Surgeon: Burnette Fallow, MD;  Location: [facility];  Service: Gastroenterology;  Laterality: N/A;   ESOPHAGOGASTRODUODENOSCOPY N/A 08/11/2024   Procedure: EGD (ESOPHAGOGASTRODUODENOSCOPY);  Surgeon: Saintclair Jasper, MD;  Location: [facility];  Service: Gastroenterology;  Laterality: N/A;   ESOPHAGOGASTRODUODENOSCOPY (EGD) WITH PROPOFOL  N/A 04/06/2016   Procedure: ESOPHAGOGASTRODUODENOSCOPY (EGD) WITH PROPOFOL ;  Surgeon: Lenis Ethyl, MD;  [facility];  Service: General;  Laterality: N/A;   ESOPHAGOGASTRODUODENOSCOPY (EGD) WITH PROPOFOL  N/A 03/02/2017    Procedure: ESOPHAGOGASTRODUODENOSCOPY (EGD) WITH PROPOFOL ;  Surgeon: Ethyl Lenis, MD;  Location: [facility];  Service: General;  Laterality: N/A;   ESOPHAGOGASTRODUODENOSCOPY (EGD) WITH PROPOFOL  N/A 05/20/2017   Procedure: ESOPHAGOGASTRODUODENOSCOPY (EGD) WITH PROPOFOL  ERAS PATHWAY;  Surgeon: Ethyl Lenis, MD;  Location: [facility];  Service: General;  Laterality: N/A;   ESOPHAGOGASTRODUODENOSCOPY (EGD) WITH PROPOFOL  N/A 10/07/2017   Procedure: ESOPHAGOGASTRODUODENOSCOPY (EGD) WITH PROPOFOL ;  Surgeon: Ethyl Lenis, MD;  Location: [facility];  Service: General;  Laterality: N/A;   ESOPHAGOGASTRODUODENOSCOPY (EGD) WITH PROPOFOL  N/A 01/20/2023   Procedure: ESOPHAGOGASTRODUODENOSCOPY (EGD) WITH PROPOFOL ;  Surgeon: Saintclair Jasper, MD;  [facility];  Service: Gastroenterology;  Laterality: N/A;   EXCISION OF SKIN TAG  06/08/2017   Procedure: EXCISION OF VULVAR SKIN TAGS X2;  Surgeon: Fredirick Glenys RAMAN, MD;  Location: [facility];  Service: Gynecology;;   FOOT SURGERY Bilateral    x 2   GASTRIC ROUX-EN-Y N/A 12/14/2016   Procedure: LAPAROSCOPIC REVISION SLEEVE GASTRECTOMY TO  ROUX-Y-GASTRIC BY-PASS, UPPER ENDO;  Surgeon: Morene Olives, MD;  [facility];  Service: General;  Laterality: N/A;   GASTROJEJUNOSTOMY N/A 05/11/2016   Procedure: LAPAROSCOPIC PLACEMENT  OF FEEDING  JEJUNOSTOMY TUBE;  Surgeon: Morene Olives, MD;  [facility];  Service: General;  Laterality: N/A;   HEMORRHOIDECTOMY WITH HEMORRHOID BANDING     HEMOSTASIS CLIP PLACEMENT  07/25/2023   Procedure: HEMOSTASIS CLIP PLACEMENT;  Surgeon: Kriss Estefana DEL, DO;  [facility];  Service: Gastroenterology;;   HOT HEMOSTASIS N/A 07/25/2023   Procedure: HOT HEMOSTASIS (ARGON PLASMA COAGULATION/BICAP);  Surgeon: Kriss Estefana DEL, DO;  Location: [facility];  Service: Gastroenterology;  Laterality: N/A;   IR FLUORO GUIDE CV LINE RIGHT  04/06/2017   IR GASTROSTOMY TUBE MOD SED  08/25/2023   IR GENERIC  HISTORICAL  05/19/2016   IR CM INJ ANY COLONIC TUBE W/FLUORO 05/19/2016 Marcey Moan, MD [facility]   IR PATIENT EVAL TECH 0-60 MINS  11/30/2017   IR REPLC DUODEN/JEJUNO TUBE PERCUT W/FLUORO  03/14/2018   IR REPLC DUODEN/JEJUNO TUBE PERCUT W/FLUORO  03/15/2018   IR US  GUIDE VASC ACCESS LEFT  08/25/2023   IR US  GUIDE VASC ACCESS LEFT  08/25/2023   IR US  GUIDE VASC ACCESS RIGHT  04/06/2017   j tube removed march 2018     KNEE ARTHROSCOPY Left 09/06/2014   LAPAROSCOPIC GASTRIC SLEEVE RESECTION N/A 01/14/2016   Procedure: LAPAROSCOPIC GASTRIC SLEEVE RESECTION;  Surgeon: Morene Olives, MD;  [facility];  Service: General;  Laterality: N/A;   LAPAROSCOPIC INSERTION GASTROSTOMY TUBE N/A 09/14/2023   Procedure: LAPAROSCOPIC REMNANT GASTROSTOMY TUBE PLACEMENT;  Surgeon: Lyndel Deward PARAS, MD;  [facility];  Service: General;  Laterality: N/A;   LAPAROSCOPIC REVISION OF GASTROJEJUNOSTOMY N/A 10/25/2017   Procedure: LAPAROSCOPIC REVISION OF GASTROJEJUNOSTOMY AND PARTIAL GASTRECTOMY, WITH PLACEMENT OF FEEDING GASTROSTOMY TUBE;  Surgeon: Olives Morene, MD;  [facility];  Service: General;  Laterality: N/A;   LAPAROSCOPIC TUBAL LIGATION  10/16/2011   Procedure: LAPAROSCOPIC TUBAL LIGATION;  Surgeon: Nena DELENA App, MD;  [facility]: [facility];  Service: Gynecology;  Laterality: Bilateral;   NOVASURE ABLATION  09/28/2010   mild persistent vaginal bleeding   right knee arthroscopy     05/07/2016   TOTAL KNEE ARTHROPLASTY Left 03/10/2018   Procedure: LEFT TOTAL KNEE ARTHROPLASTY;  Surgeon: Ernie Cough, MD;  [facility];  Service: Orthopedics;  Laterality: Left;  70 mins   TOTAL KNEE ARTHROPLASTY Right 01/23/2020   Procedure: TOTAL KNEE ARTHROPLASTY, BILATERAL TROCHANTERIC INJECTION;  Surgeon: Ernie Cough, MD;  Location[facility];  Service: Orthopedics;  Laterality: Right;  70 mins for Bilateral Troch injection 1 cc Depo and 2 CC Lidocaine    VAGINAL HYSTERECTOMY Bilateral  06/08/2017   Procedure: HYSTERECTOMY VAGINAL W/ BILATERAL SALPINGECTOMY;  Surgeon: Fredirick Glenys RAMAN, MD;  Location: [facility];  Service: Gynecology;  Laterality: Bilateral;   wisdom teeth extracted      Family History  Problem Relation Age of Onset   Hypertension Mother    Diabetes Mother    Sarcoidosis Mother        lungs and skin   Asthma Mother    Hypertension Father    Diabetes Father    Asthma Son    Tics Son    Asthma Sister    Cancer Sister        possible pancreatic cancer   Adrenal disorder Sister        Tumor    Asthma Brother    Asthma Daughter  died age 35.5   Cancer Daughter 4       brain; died age 74.5   Asthma Son    Cancer Paternal Aunt        brain, colon, lung and esophagus; unsure of primary    Breast cancer Paternal Aunt    Social History:  reports that she quit smoking about 10 years ago. Her smoking use included cigarettes. She started smoking about 30 years ago. She has a 10 pack-year smoking history. She has never used smokeless tobacco. She reports that she does not currently use alcohol. She reports that she does not use drugs.  Allergies:  Allergies  Allergen Reactions   Ciprofloxacin  Itching, Rash and Dermatitis    Other Reaction(s): ras, Unknown   Coconut (Cocos Nucifera) Anaphylaxis and Itching   Coconut Oil Anaphylaxis, Itching and Other (See Comments)    Other Reaction(s): Anaphylactic reaction   Doxycycline Nausea And Vomiting and Other (See Comments)    Other Reaction(s): Nausea & vomiting, Unknown   Oxycodone Anaphylaxis    Other Reaction(s): Anaphylactic reaction, Unknown   Oxycodone-Acetaminophen  Anaphylaxis   Penicillamine Hives and Other (See Comments)    Other Reaction(s): Hives, Not available   Trazodone  And Nefazodone Rash   Vistaril  [Hydroxyzine  Hcl] Hives   Morphine      Other Reaction(s): Unknown   Silicone    Wound Dressing Adhesive     Other Reaction(s): Not available   Hydroxyzine      Other Reaction(s): Not  available, Rash   Nefazodone     Other Reaction(s): Rash   Penicillin G     Other Reaction(s): Rash, Unknown   Penicillins Rash    Has patient had a PCN reaction causing immediate rash, facial/tongue/throat swelling, SOB or lightheadedness with hypotension: Yes Has patient had a PCN reaction causing severe rash involving mucus membranes or skin necrosis: No Has patient had a PCN reaction that required hospitalization No Has patient had a PCN reaction occurring within the last 10 years: No If all of the above answers are NO, then may proceed with Cephalosporin use.   Tape Rash    And paper tape causes a rash if wearing for a prolong period of time  Other Reaction(s): Unknown   Trazodone      Other Reaction(s): Rash, Unknown    Current Facility-Administered Medications  Medication Dose Route Frequency Provider Last Rate Last Admin   lactated ringers  infusion   Intravenous Continuous Emil Share, DO 200 mL/hr at 08/21/24 0430 New Bag at 08/21/24 0430   metroNIDAZOLE  (FLAGYL ) IVPB 500 mg  500 mg Intravenous Once Floyd, Dan, DO 100 mL/hr at 08/21/24 0430 500 mg at 08/21/24 0430   sodium chloride  flush (NS) 0.9 % injection 10-40 mL  10-40 mL Intracatheter PRN Floyd, Dan, DO       Current Outpatient Medications  Medication Sig Dispense Refill   albuterol  (VENTOLIN  HFA) 108 (90 Base) MCG/ACT inhaler Inhale 2 puffs into the lungs every 6 (six) hours as needed for wheezing or shortness of breath.     atorvastatin  (LIPITOR) 40 MG tablet Take 40 mg by mouth daily.     botulinum toxin Type A  (BOTOX ) 200 units injection INJECT 155 UNITS INTRAMUSCULARLY INTO HEAD AND NECK MUSCLES EVERY 3 MONTHS 1 each 3   Buprenorphine  HCl (BELBUCA ) 75 MCG FILM Place 600 mcg under the tongue in the morning and at bedtime.     buPROPion  (WELLBUTRIN  XL) 150 MG 24 hr tablet Take 150 mg by mouth daily. Take with 300mg  for total  dose 450mg      buPROPion  (WELLBUTRIN  XL) 300 MG 24 hr tablet Take 300 mg by mouth every  morning. Take along with 150 mg=450 mg     busPIRone  (BUSPAR ) 15 MG tablet Take 15 mg by mouth 3 (three) times daily.     Cholecalciferol (VITAMIN D3) 1.25 MG (50000 UT) CAPS Take 50,000 Units by mouth every 7 (seven) days.      clonazePAM  (KLONOPIN ) 1 MG tablet Take 1 mg by mouth 2 (two) times daily as needed for anxiety.     cyanocobalamin  (,VITAMIN B-12,) 1000 MCG/ML injection Inject 1,000 mcg into the muscle 2 (two) times a week.      diclofenac  Sodium (VOLTAREN ) 1 % GEL Apply 2 g topically 4 (four) times daily as needed (Pain).     DULoxetine  (CYMBALTA ) 60 MG capsule Take 1 capsule (60 mg total) by mouth 2 (two) times daily. 60 capsule 0   EPINEPHrine  0.3 mg/0.3 mL IJ SOAJ injection Inject 0.3 mg into the muscle as needed for anaphylaxis. 1 each 0   ferrous sulfate  325 (65 FE) MG tablet Take 1 tablet (325 mg total) by mouth daily with breakfast.     gabapentin  (NEURONTIN ) 600 MG tablet Take 600 mg by mouth 3 (three) times daily.     HYDROmorphone  (DILAUDID ) 4 MG tablet Take 4 mg by mouth every 6 (six) hours as needed.     linaclotide  (LINZESS ) 290 MCG CAPS capsule Take 290 mcg by mouth daily before breakfast.     magnesium  oxide (MAG-OX) 400 (240 Mg) MG tablet Take 400 mg by mouth daily.     meclizine  (ANTIVERT ) 25 MG tablet Take 25 mg by mouth 3 (three) times daily.     mesalamine  (LIALDA ) 1.2 g EC tablet Take 2.4 g by mouth 2 (two) times daily.     midodrine  (PROAMATINE ) 10 MG tablet Take 1 tablet (10 mg total) by mouth 3 (three) times daily with meals. 90 tablet 0   Multiple Vitamin (MULTIVITAMIN WITH MINERALS) TABS tablet Take 1 tablet by mouth daily.     NARCAN  4 MG/0.1ML LIQD nasal spray kit Place 1 spray into the nose daily as needed (overdose).     Nutritional Supplements (TWOCAL HN 2.0) LIQD 711 mLs by Per J Tube route at bedtime.     Nystatin  (GERHARDT'S BUTT CREAM) CREA Apply 1 Application topically 3 (three) times daily. 2 each 3   ondansetron  (ZOFRAN -ODT) 8 MG disintegrating  tablet Take 8 mg by mouth 3 (three) times daily.     oxybutynin  (DITROPAN ) 5 MG tablet Take 10 mg by mouth at bedtime.     pantoprazole  (PROTONIX ) 40 MG tablet Take 1 tablet (40 mg total) by mouth 2 (two) times daily. 120 tablet 3   QULIPTA  30 MG TABS TAKE 1 TABLET BY MOUTH EVERY DAY 30 tablet 10   sucralfate  (CARAFATE ) 1 g tablet Take 1 tablet (1 g total) by mouth every 6 (six) hours. 120 tablet 1   thiamine  (VITAMIN B-1) 100 MG tablet Take 100 mg by mouth daily at 12 noon.      valACYclovir  (VALTREX ) 1000 MG tablet Take 1,000 mg by mouth 2 (two) times daily as needed.        Results for orders placed or performed during the hospital encounter of 08/21/24 (from the past 48 hours)  Urinalysis, Routine w reflex microscopic -Urine, Clean Catch     Status: Abnormal   Collection Time: 08/21/24  1:06 AM  Result Value Ref Range   Color,  Urine YELLOW YELLOW   APPearance CLEAR CLEAR   Specific Gravity, Urine >1.046 (H) 1.005 - 1.030   pH 5.0 5.0 - 8.0   Glucose, UA NEGATIVE NEGATIVE mg/dL   Hgb urine dipstick NEGATIVE NEGATIVE   Bilirubin Urine NEGATIVE NEGATIVE   Ketones, ur NEGATIVE NEGATIVE mg/dL   Protein, ur 899 (A) NEGATIVE mg/dL   Nitrite NEGATIVE NEGATIVE   Leukocytes,Ua NEGATIVE NEGATIVE   RBC / HPF 0-5 0 - 5 RBC/hpf   WBC, UA 0-5 0 - 5 WBC/hpf   Bacteria, UA RARE (A) NONE SEEN   Squamous Epithelial / HPF 0-5 0 - 5 /HPF   Mucus PRESENT     Comment: Performed at [facility], [address], [place], [place] [address]  CBC with Differential     Status: Abnormal   Collection Time: 08/21/24  1:40 AM  Result Value Ref Range   WBC 26.3 (H) 4.0 - 10.5 K/uL   RBC 4.54 3.87 - 5.11 MIL/uL   Hemoglobin 14.0 12.0 - 15.0 g/dL   HCT 56.0 63.9 - 53.9 %   MCV 96.7 80.0 - 100.0 fL   MCH 30.8 26.0 - 34.0 pg   MCHC 31.9 30.0 - 36.0 g/dL   RDW 85.2 88.4 - 84.4 %   Platelets 525 (H) 150 - 400 K/uL   nRBC 0.0 0.0 - 0.2 %   Neutrophils Relative % 93 %   Neutro Abs 24.4 (H)  1.7 - 7.7 K/uL   Lymphocytes Relative 3 %   Lymphs Abs 0.9 0.7 - 4.0 K/uL   Monocytes Relative 3 %   Monocytes Absolute 0.8 0.1 - 1.0 K/uL   Eosinophils Relative 0 %   Eosinophils Absolute 0.0 0.0 - 0.5 K/uL   Basophils Relative 0 %   Basophils Absolute 0.1 0.0 - 0.1 K/uL   WBC Morphology See Note     Comment: TOXIC GRANULATION INCREASED BANDS (>20% BANDS) DOHLE BODIES Increased Bands Large Granular Lymphocytes    Immature Granulocytes 1 %   Abs Immature Granulocytes 0.16 (H) 0.00 - 0.07 K/uL    Comment: Performed at [facility], [address], [place], [place] [address]  Comprehensive metabolic panel     Status: Abnormal   Collection Time: 08/21/24  1:40 AM  Result Value Ref Range   Sodium 137 135 - 145 mmol/L   Potassium 4.1 3.5 - 5.1 mmol/L   Chloride 101 98 - 111 mmol/L   CO2 25 22 - 32 mmol/L   Glucose, Bld 168 (H) 70 - 99 mg/dL    Comment: Glucose reference range applies only to samples taken after fasting for at least 8 hours.   BUN 17 6 - 20 mg/dL   Creatinine, Ser 9.20 0.44 - 1.00 mg/dL   Calcium  9.7 8.9 - 10.3 mg/dL   Total Protein 6.9 6.5 - 8.1 g/dL   Albumin  3.6 3.5 - 5.0 g/dL   AST 18 15 - 41 U/L    Comment: HEMOLYSIS AT THIS LEVEL MAY AFFECT RESULT   ALT 6 0 - 44 U/L   Alkaline Phosphatase 60 38 - 126 U/L   Total Bilirubin 0.5 0.0 - 1.2 mg/dL   GFR, Estimated >39 >39 mL/min    Comment: (NOTE) Calculated using the CKD-EPI Creatinine Equation (2021)    Anion gap 11 5 - 15    Comment: Performed at [facility], [address] [place], [place] [address]  Lipase, blood     Status: None   Collection  Time: 08/21/24  1:40 AM  Result Value Ref Range   Lipase 15 11 - 51 U/L    Comment: Performed at [facility], [address] [place], [place] [address]   CT ABDOMEN PELVIS W CONTRAST Result Date: 08/21/2024 EXAM: CT ABDOMEN AND PELVIS WITH CONTRAST 08/21/2024 03:07:05 AM TECHNIQUE: CT of the abdomen  and pelvis was performed with the administration of 100 mL of iohexol  (OMNIPAQUE ) 300 MG/ML solution. Multiplanar reformatted images are provided for review. Automated exposure control, iterative reconstruction, and/or weight-based adjustment of the mA/kV was utilized to reduce the radiation dose to as low as reasonably achievable. COMPARISON: 08/09/2024 CLINICAL HISTORY: Abdominal pain, acute, nonlocalized. FINDINGS: LOWER CHEST: Lung bases are free of acute infiltrate or sizable effusion. LIVER: The liver is well visualized and within normal limits. SABRA GALLBLADDER AND BILE DUCTS: The gallbladder is within normal limits. No biliary ductal dilatation. SPLEEN: The spleen demonstrates a cystic lesion centrally similar to that noted on the prior exam. PANCREAS: The pancreas is within normal limits. ADRENAL GLANDS: The adrenal glands are within normal limits. KIDNEYS, URETERS AND BLADDER: The kidneys demonstrate a normal enhancement pattern. No stones in the kidneys or ureters. The bladder is well distended. GI AND BOWEL: Stomach demonstrates no acute abnormality. Gastrostomy catheter is noted extending into the distal gastric remnant and subsequently into the proximal duodenum. Changes of prior gastric bypass are seen with gastrojejunostomy identified. In the area of the gastrojejunal anastomosis, there is an apparent area of mucosal discontinuity identified and best seen on axial image number 27 of series 2 and coronal image number 49 of series 8. Adjacent air and fluid are noted anterior to this consistent with spillage of gastric contents into the abdominal cavity. This would account for the free fluid within the abdomen and pelvis. Hyperdense material is noted adjacent to the defect suspicious for thrombus. No active extravasation is noted. Small bowel loops are mildly hyperemic and thickened which may be reactive to the new free fluid within the abdomen. No obstructive or inflammatory changes of the colon are seen.  The appendix is within normal limits. PERITONEUM AND RETROPERITONEUM: Perihepatic fluid is noted, new from the prior exam. Considerable free fluid is noted within the pelvis as well. Adjacent air and fluid are noted anterior to the defect at the gastrojejunal anastomosis, consistent with spillage of bowel contents into the abdominal cavity. VASCULATURE: Vascular structures appear within normal limits. LYMPH NODES: No lymphadenopathy. REPRODUCTIVE ORGANS: The uterus has been surgically removed. BONES AND SOFT TISSUES: Bony structures appear within normal limits. No focal soft tissue abnormality. IMPRESSION: 1. Mucosal discontinuity at the gastrojejunal anastomosis with adjacent air and fluid, consistent with spillage of bowel contents into the abdominal cavity. No active hemorrhage is noted. Surgical consultation is recommended. 2. Considerable free intraperitoneal fluid, greatest in the pelvis, new from the prior exam. Results were called to Dr Emil at the time of exam interpretation. Electronically signed by: Oneil Devonshire MD 08/21/2024 03:32 AM EST RP Workstation: [id number]    Review of Systems  Constitutional:  Positive for fatigue. Negative for fever.  Gastrointestinal:  Positive for abdominal pain.  All other systems reviewed and are negative.   Blood pressure 103/88, pulse (!) 107, temperature 98.2 F (36.8 C), resp. rate 18, height 5' 7 (1.702 m), weight 73 kg, last menstrual period 06/02/2017, SpO2 95%. Physical Exam Vitals reviewed.  Constitutional:      Appearance: She is well-developed.  HENT:     Head: Normocephalic and atraumatic.  Eyes:  General: No scleral icterus. Cardiovascular:     Rate and Rhythm: Normal rate and regular rhythm.  Pulmonary:     Effort: Pulmonary effort is normal.  Abdominal:     General: There is no distension.     Palpations: Abdomen is soft.     Tenderness: There is generalized abdominal tenderness. There is guarding.     Hernia: No hernia is  present.  Skin:    General: Skin is warm.     Capillary Refill: Capillary refill takes less than 2 seconds.  Neurological:     General: No focal deficit present.     Mental Status: She is alert.  Psychiatric:        Mood and Affect: Mood is anxious.      Assessment/Plan Perforated viscus, likely at site of marginal ulcer at GJ -NPO -discussed she will need to go to OR this am for either laparoscopic or open exploration depending on availability.  Will discuss with weight loss surgery team who has been caring for her shortly. -start iv fluids -broad spectrum abx  I reviewed ED provider notes, last 24 h vitals and pain scores, last 48 h intake and output, last 24 h labs and trends, and last 24 h imaging results. I reviewed ct scan showing perforation as well as documentation from years prior including visits to [facility]   Donnice Bury, MD 08/21/2024, 5:05 AM

## 2024-08-21 NOTE — Progress Notes (Signed)
 Initial Nutrition Assessment  INTERVENTION:   -Diet advancement per surgery  -If tube feeding able to be resumed, recommend 24 hour feeds to begin with to ensure pt is receiving 100% of her nutrition.  -Recommend Osmolite 1.5 @ 42 ml/hr via G-tube, providing 1512 kcals, 63g protein and 768 ml H2O -60 ml Prosource TF 20 daily, provides 80 kcals and 20g protein  Free water  recommendations if not taking in fluids or receiving IVF: -120 ml every 4 hours (720 ml)  -Agree with checking vitamin labs once able per surgery note  NUTRITION DIAGNOSIS:   Inadequate oral intake related to chronic illness as evidenced by NPO status.  GOAL:   Patient will meet greater than or equal to 90% of their needs  MONITOR:   Diet advancement  REASON FOR ASSESSMENT:   Malnutrition Screening Tool (G-tube)    ASSESSMENT:   48 y.o. 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 Just recently discharged 11/20. Readmitted now for abdominal pain and CT scan shows remnant g tube, perforated GJ tube likely at ulcer site.  Patient in OR for ex lap. Awaiting plan following procedure.  Pt has been using tube but not giving full amount of recommended tube feeds. Has not been taking vitamins. Noted in surgery note, plan to check vitamin labs which agree with. Will follow-up with pt at a later time once plan is established.  Admission weight: 160 lbs  Medications: Zofran   Labs reviewed.  Diet Order:   Diet Order             Diet NPO time specified  Diet effective now                   EDUCATION NEEDS:   Not appropriate for education at this time  Skin:  Skin Assessment: Reviewed RN Assessment  Last BM:  11/18  Height:   Ht Readings from Last 1 Encounters:  08/21/24 5' 7 (1.702 m)    Weight:   Wt Readings from Last 1 Encounters:  08/21/24 73 kg    BMI:  Body mass index is 25.21 kg/m.  Estimated Nutritional Needs:    Kcal:  1500-1700  Protein:  75-90g  Fluid:  1.7L/day   Morna Lee, MS, RD, LDN Inpatient Clinical Dietitian Contact via Secure chat

## 2024-08-21 NOTE — Anesthesia Preprocedure Evaluation (Signed)
 Anesthesia Evaluation  Patient identified by MRN, date of birth, ID band Patient awake    Reviewed: Allergy & Precautions, H&P , NPO status , Patient's Chart, lab work & pertinent test results  History of Anesthesia Complications (+) PONV and history of anesthetic complications  Airway Mallampati: II  TM Distance: >3 FB Neck ROM: Full    Dental no notable dental hx. 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 now with perforated viscus   Endo/Other  negative endocrine ROS    Renal/GU negative Renal ROS  negative genitourinary   Musculoskeletal  (+) Arthritis ,  narcotic dependent  Abdominal   Peds  Hematology  (+) Blood dyscrasia, anemia   Anesthesia Other Findings Suboxone use   Reproductive/Obstetrics negative OB ROS                              Anesthesia Physical Anesthesia Plan  ASA: 3  Anesthesia Plan: General   Post-op Pain Management: Minimal or no pain anticipated and Ofirmev  IV (intra-op)*   Induction: Intravenous  PONV Risk Score and Plan: 4 or greater and Treatment may vary due to age or medical condition, Dexamethasone , Ondansetron , Midazolam , Diphenhydramine  and Propofol  infusion  Airway Management Planned: Oral ETT  Additional Equipment:   Intra-op Plan:   Post-operative Plan: Extubation in OR  Informed Consent: I have reviewed the patients History and Physical, chart, labs and discussed the procedure including the risks, benefits and  alternatives for the proposed anesthesia with the patient or authorized representative who has indicated his/her understanding and acceptance.     Dental advisory given  Plan Discussed with: CRNA  Anesthesia Plan Comments: ()        Anesthesia Quick Evaluation

## 2024-08-21 NOTE — ED Provider Notes (Signed)
 [place] EMERGENCY DEPARTMENT AT [facility] Provider Note   CSN: [id number] Arrival date & time: 08/21/24  9963     Patient presents with: Shoulder Pain and Abdominal Pain   [name] is a 48 y.o. female.   48 yo F with a chief complaints of abdominal pain.  She tells me she was just in the hospital and has had pain ever since she got home.  She has been able to use her feeding tube but has some pain at the site and some down to the inferior and right lateral aspect of it but is more painful.  She has been doing less feeds than normal because it hurts.  No vomiting no fevers.  Has not been able to take any pills by mouth.   Shoulder Pain Abdominal Pain      Prior to Admission medications   Medication Sig Start Date End Date Taking? Authorizing Provider  albuterol  (VENTOLIN  HFA) 108 (90 Base) MCG/ACT inhaler Inhale 2 puffs into the lungs every 6 (six) hours as needed for wheezing or shortness of breath.    [provider]  atorvastatin  (LIPITOR) 40 MG tablet Take 40 mg by mouth daily. 12/10/22   [provider]  botulinum toxin Type A  (BOTOX ) 200 units injection INJECT 155 UNITS INTRAMUSCULARLY INTO HEAD AND NECK MUSCLES EVERY 3 MONTHS 11/23/22   Onita Duos, MD  Buprenorphine  HCl (BELBUCA ) 75 MCG FILM Place 600 mcg under the tongue in the morning and at bedtime.    [provider]  buPROPion  (WELLBUTRIN  XL) 150 MG 24 hr tablet Take 150 mg by mouth daily. Take with 300mg  for total dose 450mg     [provider]  buPROPion  (WELLBUTRIN  XL) 300 MG 24 hr tablet Take 300 mg by mouth every morning. Take along with 150 mg=450 mg 06/05/20   [provider]  busPIRone  (BUSPAR ) 15 MG tablet Take 15 mg by mouth 3 (three) times daily.    [provider]  Cholecalciferol (VITAMIN D3) 1.25 MG (50000 UT) CAPS Take 50,000 Units by mouth every 7 (seven) days.  04/03/19   [provider]  clonazePAM  (KLONOPIN ) 1 MG  tablet Take 1 mg by mouth 2 (two) times daily as needed for anxiety.    [provider]  cyanocobalamin  (,VITAMIN B-12,) 1000 MCG/ML injection Inject 1,000 mcg into the muscle 2 (two) times a week.  04/06/19   [provider]  diclofenac  Sodium (VOLTAREN ) 1 % GEL Apply 2 g topically 4 (four) times daily as needed (Pain).    [provider]  DULoxetine  (CYMBALTA ) 60 MG capsule Take 1 capsule (60 mg total) by mouth 2 (two) times daily. 04/10/19   Wonda Clarita BRAVO, NP  EPINEPHrine  0.3 mg/0.3 mL IJ SOAJ injection Inject 0.3 mg into the muscle as needed for anaphylaxis. 04/12/21   Eudelia Maude SAUNDERS, PA-C  ferrous sulfate  325 (65 FE) MG tablet Take 1 tablet (325 mg total) by mouth daily with breakfast. 01/29/23   Cheryle Page, MD  gabapentin  (NEURONTIN ) 600 MG tablet Take 600 mg by mouth 3 (three) times daily.    [provider]  HYDROmorphone  (DILAUDID ) 4 MG tablet Take 4 mg by mouth every 6 (six) hours as needed. 09/17/21   [provider]  linaclotide  (LINZESS ) 290 MCG CAPS capsule Take 290 mcg by mouth daily before breakfast.    [provider]  magnesium  oxide (MAG-OX) 400 (240 Mg) MG tablet Take 400 mg by mouth daily. 12/19/22   [provider]  meclizine  (ANTIVERT ) 25 MG tablet Take 25 mg by mouth 3 (three) times daily. 04/15/22   [provider]  mesalamine  (LIALDA ) 1.2 g EC tablet Take 2.4 g by mouth 2 (two) times daily.    [provider]  midodrine  (PROAMATINE ) 10 MG tablet Take 1 tablet (10 mg total) by mouth 3 (three) times daily with meals. 08/17/24   Odell Celinda Balo, MD  Multiple Vitamin (MULTIVITAMIN WITH MINERALS) TABS tablet Take 1 tablet by mouth daily.    [provider]  NARCAN  4 MG/0.1ML LIQD nasal spray kit Place 1 spray into the nose daily as needed (overdose). 03/18/20   [provider]  Nutritional Supplements (TWOCAL HN 2.0) LIQD 711 mLs by Per J Tube route at bedtime.    [provider]  Nystatin  (GERHARDT'S BUTT CREAM) CREA Apply 1 Application topically 3 (three) times daily. 08/17/24   Odell Celinda Balo, MD  ondansetron  (ZOFRAN -ODT) 8 MG disintegrating tablet Take 8 mg by mouth 3 (three) times daily. 06/13/24   [provider]  oxybutynin  (DITROPAN ) 5 MG tablet Take 10 mg by mouth at bedtime.    [provider]  pantoprazole  (PROTONIX ) 40 MG tablet Take 1 tablet (40 mg total) by mouth 2 (two) times daily. 08/17/24   Odell Celinda Balo, MD  QULIPTA  30 MG TABS TAKE 1 TABLET BY MOUTH EVERY DAY 12/27/23   Gayland Lauraine PARAS, NP  sucralfate  (CARAFATE ) 1 g tablet Take 1 tablet (1 g total) by mouth every 6 (six) hours. 08/17/24   Odell Celinda Balo, MD  thiamine  (VITAMIN B-1) 100 MG tablet Take 100 mg by mouth daily at 12 noon.     [provider]  valACYclovir  (VALTREX ) 1000 MG tablet Take 1,000 mg by mouth 2 (two) times daily as needed.    [provider]    Allergies: Ciprofloxacin , Coconut (cocos nucifera), Coconut oil, Doxycycline, Oxycodone, Oxycodone-acetaminophen , Penicillamine, Trazodone  and nefazodone, Vistaril  [hydroxyzine  hcl], Morphine , Silicone, Wound dressing adhesive, Hydroxyzine , Nefazodone, Penicillin g, Penicillins, Tape, and Trazodone     Review of Systems  Gastrointestinal:  Positive for abdominal pain.    Updated Vital Signs BP 117/65   Pulse (!) 115   Temp 98.2 F (36.8 C)   Resp 18   Ht 5' 7 (1.702 m)   Wt 73 kg   LMP 06/02/2017 Comment: hysterectomy 06/08/2017  SpO2 100%   BMI 25.21 kg/m   Physical Exam Vitals and nursing note reviewed.  Constitutional:      General: She is not in acute distress.    Appearance: She is well-developed. She is not diaphoretic.  HENT:     Head: Normocephalic and atraumatic.  Eyes:     Pupils: Pupils are equal, round, and reactive to light.  Cardiovascular:     Rate and Rhythm: Normal rate and regular rhythm.     Heart sounds: No murmur heard.    No friction  rub. No gallop.  Pulmonary:     Effort: Pulmonary effort is normal.     Breath sounds: No wheezing or rales.  Abdominal:     General: There is no distension.     Palpations: Abdomen is soft.     Tenderness: There is abdominal tenderness.     Comments: Pains worse in the right lower quadrant.  Diffuse pain otherwise.  Mild pain surrounding the feeding tube site without any obvious skin breakdown or erythema or induration.  Musculoskeletal:        General: No tenderness.  Cervical back: Normal range of motion and neck supple.  Skin:    General: Skin is warm and dry.  Neurological:     Mental Status: She is alert and oriented to person, place, and time.  Psychiatric:        Behavior: Behavior normal.     (all labs ordered are listed, but only abnormal results are displayed) Labs Reviewed  CBC WITH DIFFERENTIAL/PLATELET - Abnormal; Notable for the following components:      Result Value   WBC 26.3 (*)    Platelets 525 (*)    Neutro Abs 24.4 (*)    Abs Immature Granulocytes 0.16 (*)    All other components within normal limits  COMPREHENSIVE METABOLIC PANEL WITH GFR - Abnormal; Notable for the following components:   Glucose, Bld 168 (*)    All other components within normal limits  LIPASE, BLOOD  URINALYSIS, ROUTINE W REFLEX MICROSCOPIC    EKG: None  Radiology: CT ABDOMEN PELVIS W CONTRAST Result Date: 08/21/2024 EXAM: CT ABDOMEN AND PELVIS WITH CONTRAST 08/21/2024 03:07:05 AM TECHNIQUE: CT of the abdomen and pelvis was performed with the administration of 100 mL of iohexol  (OMNIPAQUE ) 300 MG/ML solution. Multiplanar reformatted images are provided for review. Automated exposure control, iterative reconstruction, and/or weight-based adjustment of the mA/kV was utilized to reduce the radiation dose to as low as reasonably achievable. COMPARISON: 08/09/2024 CLINICAL HISTORY: Abdominal pain, acute, nonlocalized. FINDINGS: LOWER CHEST: Lung bases are free of acute infiltrate or  sizable effusion. LIVER: The liver is well visualized and within normal limits. SABRA GALLBLADDER AND BILE DUCTS: The gallbladder is within normal limits. No biliary ductal dilatation. SPLEEN: The spleen demonstrates a cystic lesion centrally similar to that noted on the prior exam. PANCREAS: The pancreas is within normal limits. ADRENAL GLANDS: The adrenal glands are within normal limits. KIDNEYS, URETERS AND BLADDER: The kidneys demonstrate a normal enhancement pattern. No stones in the kidneys or ureters. The bladder is well distended. GI AND BOWEL: Stomach demonstrates no acute abnormality. Gastrostomy catheter is noted extending into the distal gastric remnant and subsequently into the proximal duodenum. Changes of prior gastric bypass are seen with gastrojejunostomy identified. In the area of the gastrojejunal anastomosis, there is an apparent area of mucosal discontinuity identified and best seen on axial image number 27 of series 2 and coronal image number 49 of series 8. Adjacent air and fluid are noted anterior to this consistent with spillage of gastric contents into the abdominal cavity. This would account for the free fluid within the abdomen and pelvis. Hyperdense material is noted adjacent to the defect suspicious for thrombus. No active extravasation is noted. Small bowel loops are mildly hyperemic and thickened which may be reactive to the new free fluid within the abdomen. No obstructive or inflammatory changes of the colon are seen. The appendix is within normal limits. PERITONEUM AND RETROPERITONEUM: Perihepatic fluid is noted, new from the prior exam. Considerable free fluid is noted within the pelvis as well. Adjacent air and fluid are noted anterior to the defect at the gastrojejunal anastomosis, consistent with spillage of bowel contents into the abdominal cavity. VASCULATURE: Vascular structures appear within normal limits. LYMPH NODES: No lymphadenopathy. REPRODUCTIVE ORGANS: The uterus has  been surgically removed. BONES AND SOFT TISSUES: Bony structures appear within normal limits. No focal soft tissue abnormality. IMPRESSION: 1. Mucosal discontinuity at the gastrojejunal anastomosis with adjacent air and fluid, consistent with spillage of bowel contents into the abdominal cavity. No active hemorrhage is noted. Surgical consultation is  recommended. 2. Considerable free intraperitoneal fluid, greatest in the pelvis, new from the prior exam. Results were called to Dr Emil at the time of exam interpretation. Electronically signed by: Oneil Devonshire MD 08/21/2024 03:32 AM EST RP Workstation: [id number]     .Critical Care  Performed by: Emil Share, DO Authorized by: Emil Share, DO   Critical care provider statement:    Critical care time (minutes):  35   Critical care time was exclusive of:  Separately billable procedures and treating other patients   Critical care was time spent personally by me on the following activities:  Development of treatment plan with patient or surrogate, discussions with consultants, evaluation of patient's response to treatment, examination of patient, ordering and review of laboratory studies, ordering and review of radiographic studies, ordering and performing treatments and interventions, pulse oximetry, re-evaluation of patient's condition and review of old charts   Care discussed with: admitting provider      Medications Ordered in the ED  sodium chloride  flush (NS) 0.9 % injection 10-40 mL (has no administration in time range)  metroNIDAZOLE  (FLAGYL ) IVPB 500 mg (has no administration in time range)  ceFEPIme  (MAXIPIME ) 2 g in sodium chloride  0.9 % 100 mL IVPB (2 g Intravenous New Bag/Given 08/21/24 0355)  lactated ringers  infusion (has no administration in time range)  HYDROmorphone  (DILAUDID ) injection 1 mg (1 mg Intravenous Given 08/21/24 0142)  droperidol  (INAPSINE ) 2.5 MG/ML injection 1.25 mg (1.25 mg Intravenous Given 08/21/24 0145)   diphenhydrAMINE  (BENADRYL ) injection 25 mg (25 mg Intravenous Given 08/21/24 0146)  sodium chloride  0.9 % bolus 1,000 mL (0 mLs Intravenous Stopped 08/21/24 0308)  iohexol  (OMNIPAQUE ) 300 MG/ML solution 100 mL (100 mLs Intravenous Contrast Given 08/21/24 0307)  HYDROmorphone  (DILAUDID ) injection 1 mg (1 mg Intravenous Given 08/21/24 0349)                                    Medical Decision Making Amount and/or Complexity of Data Reviewed Labs: ordered. Radiology: ordered.  Risk Prescription drug management. Decision regarding hospitalization.   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  Patient was just in the hospital and had an EGD performed by GI found to have a gastrojejunal anastomosis ulcer and a nonbleeding gastric ulcer.   Will obtain a laboratory evaluation.  Repeat CT imaging.  Attempt to treat symptoms.  Reassess.  Significant leukocytosis of 26,000.  No significant electrolyte abnormalities.  Renal function appears to be near baseline.  CT imaging concerning for anastomosis disruption.  New intra-abdominal free fluid.  I discussed this with the radiologist.  I discussed this with general surgery, Dr. Ebbie surgery to see the patient this morning.  The patients results and plan were reviewed and discussed.   Any x-rays performed were independently reviewed by myself.   Differential diagnosis were considered with the presenting HPI.  Medications  sodium chloride  flush (NS) 0.9 % injection 10-40 mL (has no administration in time range)  metroNIDAZOLE  (FLAGYL ) IVPB 500 mg (has no administration in time range)  ceFEPIme  (MAXIPIME ) 2 g in sodium chloride  0.9 % 100 mL IVPB (2 g Intravenous New Bag/Given 08/21/24 0355)  lactated ringers  infusion (has no administration in time range)  HYDROmorphone  (DILAUDID ) injection 1 mg (1 mg Intravenous Given 08/21/24  0142)  droperidol  (INAPSINE ) 2.5 MG/ML injection 1.25 mg (1.25 mg Intravenous Given  08/21/24 0145)  diphenhydrAMINE  (BENADRYL ) injection 25 mg (25 mg Intravenous Given 08/21/24 0146)  sodium chloride  0.9 % bolus 1,000 mL (0 mLs Intravenous Stopped 08/21/24 0308)  iohexol  (OMNIPAQUE ) 300 MG/ML solution 100 mL (100 mLs Intravenous Contrast Given 08/21/24 0307)  HYDROmorphone  (DILAUDID ) injection 1 mg (1 mg Intravenous Given 08/21/24 0349)    Vitals:   08/21/24 0049 08/21/24 0052 08/21/24 0053  BP:  117/65 117/65  Pulse:  (!) 115 (!) 115  Resp:  18 18  Temp:  98.2 F (36.8 C) 98.2 F (36.8 C)  TempSrc:  Oral   SpO2:  100% 100%  Weight: 73 kg    Height: 5' 7 (1.702 m)      Final diagnoses:  Dehiscence of intestinal anastomosis, initial encounter    Admission/ observation were discussed with the admitting physician, patient and/or family and they are comfortable with the plan.       Final diagnoses:  Dehiscence of intestinal anastomosis, initial encounter    ED Discharge Orders     None          Emil Share, DO 08/21/24 [phone]

## 2024-08-21 NOTE — ED Notes (Signed)
 Contact [facility]. They were aware we are bringing this pt up and they stated they had reviewed the chart.

## 2024-08-21 NOTE — Progress Notes (Signed)
 Lactic acid 3.9

## 2024-08-21 NOTE — Transfer of Care (Signed)
 Immediate Anesthesia Transfer of Care Note  Patient: [name]  Procedure(s) Performed: LAPAROSCOPY, DIAGNOSTIC, LAPAROSCOPIC GRAM PATCH OF ULCER  Patient Location: PACU  Anesthesia Type:General  Level of Consciousness: sedated, patient cooperative, and responds to stimulation  Airway & Oxygen Therapy: Patient Spontanous Breathing and Patient connected to face mask oxygen  Post-op Assessment: Report given to RN and Post -op Vital signs reviewed and stable  Post vital signs: Reviewed and stable  Last Vitals:  Vitals Value Taken Time  BP 125/82 08/21/24 15:31  Temp    Pulse 106 08/21/24 15:34  Resp 24 08/21/24 15:34  SpO2 100 % 08/21/24 15:34  Vitals shown include unfiled device data.  Last Pain:  Vitals:   08/21/24 1103  TempSrc:   PainSc: Asleep         Complications: No notable events documented.

## 2024-08-21 NOTE — ED Notes (Signed)
 PT states she took a sleeping pill to help with the pain.  Reports she is sleepy but still has pain.

## 2024-08-22 ENCOUNTER — Encounter (HOSPITAL_COMMUNITY): Payer: Self-pay | Admitting: General Surgery

## 2024-08-22 LAB — BASIC METABOLIC PANEL WITH GFR
Anion gap: 6 (ref 5–15)
BUN: 21 mg/dL — ABNORMAL HIGH (ref 6–20)
CO2: 25 mmol/L (ref 22–32)
Calcium: 8.2 mg/dL — ABNORMAL LOW (ref 8.9–10.3)
Chloride: 111 mmol/L (ref 98–111)
Creatinine, Ser: 0.54 mg/dL (ref 0.44–1.00)
GFR, Estimated: >60 mL/min (ref >60)
Glucose, Bld: 101 mg/dL — ABNORMAL HIGH (ref 70–99)
Potassium: 3.9 mmol/L (ref 3.5–5.1)
Sodium: 142 mmol/L (ref 135–145)

## 2024-08-22 LAB — IRON AND TIBC
Iron: <10 ug/dL — ABNORMAL LOW (ref 28–170)
TIBC: 141 ug/dL — ABNORMAL LOW (ref 250–450)

## 2024-08-22 LAB — CBC
HCT: 24.2 % — ABNORMAL LOW (ref 36.0–46.0)
Hemoglobin: 7.9 g/dL — ABNORMAL LOW (ref 12.0–15.0)
MCH: 30.9 pg (ref 26.0–34.0)
MCHC: 32.6 g/dL (ref 30.0–36.0)
MCV: 94.5 fL (ref 80.0–100.0)
Platelets: 273 K/uL (ref 150–400)
RBC: 2.56 MIL/uL — ABNORMAL LOW (ref 3.87–5.11)
RDW: 14.9 % (ref 11.5–15.5)
WBC: 15.1 K/uL — ABNORMAL HIGH (ref 4.0–10.5)
nRBC: 0 % (ref 0.0–0.2)

## 2024-08-22 LAB — MAGNESIUM: Magnesium: 1.7 mg/dL (ref 1.7–2.4)

## 2024-08-22 LAB — VITAMIN B12: Vitamin B-12: 609 pg/mL (ref 180–914)

## 2024-08-22 LAB — GLUCOSE, CAPILLARY: Glucose-Capillary: 78 mg/dL (ref 70–99)

## 2024-08-22 LAB — FERRITIN: Ferritin: 111 ng/mL (ref 11–307)

## 2024-08-22 LAB — FOLATE: Folate: <3 ng/mL — ABNORMAL LOW (ref >5.9)

## 2024-08-22 MED ORDER — OSMOLITE 1.5 CAL PO LIQD
1000.0000 mL | ORAL | Status: DC
  Administered 2024-08-22: 1000 mL
  Filled 2024-08-22 (×2): qty 1000

## 2024-08-22 MED ORDER — HYDROMORPHONE HCL 1 MG/ML IJ SOLN
1.0000 mg | INTRAMUSCULAR | Status: DC | PRN
  Administered 2024-08-22 (×4): 2 mg via INTRAVENOUS
  Administered 2024-08-22: 1 mg via INTRAVENOUS
  Administered 2024-08-22: 2 mg via INTRAVENOUS
  Administered 2024-08-22: 1 mg via INTRAVENOUS
  Administered 2024-08-23 – 2024-08-24 (×14): 2 mg via INTRAVENOUS
  Filled 2024-08-22: qty 1
  Filled 2024-08-22 (×20): qty 2

## 2024-08-22 NOTE — Progress Notes (Signed)
 Initial Nutrition Assessment  DOCUMENTATION CODES:   Non-severe (moderate) malnutrition in context of chronic illness  INTERVENTION:   -Initiate Osmolite 1.5 @ 10 ml/hr via G-tube -If tolerates, can advance by 10 ml every 12 hours to goal rate of 42 ml/hr. -Goal provides 1512 kcals, 63g protein and 768 ml H2O  -Free water  recommendations if not taking in fluids or receiving IVF: -120 ml every 4 hours (720 ml)   -Vitamin labs per surgery -Will additionally check copper  and CRP levels to assess for deficiency.  NUTRITION DIAGNOSIS:   Moderate Malnutrition related to chronic illness, altered GI function as evidenced by severe muscle depletion, moderate fat depletion.  GOAL:   Patient will meet greater than or equal to 90% of their needs  Progressing.  MONITOR:   TF tolerance, Diet advancement  REASON FOR ASSESSMENT:   Consult Enteral/tube feeding initiation and management  ASSESSMENT:   48 y.o. 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 Just recently discharged 11/20. Readmitted now for abdominal pain and CT scan shows remnant g tube, perforated GJ tube likely at ulcer site.  11/24: s/p dx lap, lap gram patch repair of perforated marginal ulcer  Patient in room, feeling okay. Pt agreeable to starting feeds but at low rate which RD agrees with. Will advance slowly for tolerance following procedure yesterday.  Surgery ordered for vitamin labs this morning. RD to add additional copper  as well to check for deficiency. Already iron  and folate have returned low. Pt reports last taking bariatric vitamins in October. Ran out during a trip per visitor in room so they were supplementing with hair, skin, nail vitamins temporarily then pt never was able to resume bariatric vitamins d/t symptoms and admissions recently.  Pt reports at home was at most infusing tube feeds at 20 ml/hr d/t pain and continued leaking. Pt uses TwoCal  at home over 12 hours at night.  Admission weight: 160 lbs. Daily weights ordered with TF protocol.  Medications: Zofran   Labs reviewed: Low iron , Low folate   NUTRITION - FOCUSED PHYSICAL EXAM:  Flowsheet Row Most Recent Value  Orbital Region Mild depletion  Upper Arm Region Severe depletion  Thoracic and Lumbar Region Moderate depletion  Buccal Region Moderate depletion  Temple Region Severe depletion  Clavicle Bone Region Severe depletion  Clavicle and Acromion Bone Region Severe depletion  Scapular Bone Region Severe depletion  Dorsal Hand Mild depletion  Patellar Region Mild depletion  Anterior Thigh Region Mild depletion  Posterior Calf Region Mild depletion  Edema (RD Assessment) None  Hair Unable to assess  [covered]  Eyes Reviewed  Mouth Reviewed  [dry]  Skin Reviewed  Nails Reviewed    Diet Order:   Diet Order             Diet NPO time specified  Diet effective now                   EDUCATION NEEDS:   Education needs have been addressed  Skin:  Skin Assessment: Skin Integrity Issues: Skin Integrity Issues:: Incisions Incisions: 11/24 abdomen  Last BM:  11/18  Height:   Ht Readings from Last 1 Encounters:  08/21/24 5' 7 (1.702 m)    Weight:   Wt Readings from Last 1 Encounters:  08/21/24 73 kg    BMI:  Body mass index is 25.21 kg/m.  Estimated Nutritional Needs:   Kcal:  1500-1700  Protein:  75-90g  Fluid:  1.7L/day  Morna Lee, MS, RD, LDN Inpatient Clinical Dietitian Contact via Secure chat

## 2024-08-22 NOTE — Progress Notes (Signed)
   08/22/24 1528  TOC Brief Assessment  Insurance and Status Reviewed  Patient has primary care physician Yes  Home environment has been reviewed home with family  Prior level of function: mod independent  Prior/Current Home Services No current home services  Social Drivers of Health Review SDOH reviewed no interventions necessary  Readmission risk has been reviewed Yes  Transition of care needs no transition of care needs at this time   Note that pt connected with [facility] for tube feeding supplies.

## 2024-08-22 NOTE — Plan of Care (Signed)
   Problem: Activity: Goal: Risk for activity intolerance will decrease Outcome: Progressing

## 2024-08-22 NOTE — Progress Notes (Signed)
 Progress Note  1 Day Post-Op  Subjective: Patient reports generalized abdominal pain. Has had some nausea. Denies vomiting. No BM or flatulence yet.   ROS  All negative with the exception of above.  Objective: Vital signs in last 24 hours: Temp:  [97.7 F (36.5 C)-99.5 F (37.5 C)] 98.7 F (37.1 C) (11/25 0422) Pulse Rate:  [98-134] 102 (11/25 0422) Resp:  [16-23] 18 (11/25 0422) BP: (99-127)/(66-87) 113/75 (11/25 0422) SpO2:  [96 %-100 %] 99 % (11/25 0422)    Intake/Output from previous day: 11/24 0701 - 11/25 0700 In: 4015.9 [I.V.:3094.2; IV Piggyback:921.7] Out: 2155 [Urine:1250; Drains:895; Blood:10] Intake/Output this shift: No intake/output data recorded.  PE: General: Pleasant female who is laying in bed in NAD. HEENT: Head is normocephalic, atraumatic. Heart: HR elevated during encounter.  Lungs: Respiratory effort nonlabored. Abd: Soft with mild distention. Generalized tenderness to palpation. Incisions c/d/I. Right superior lateral drain with SS fluid in bulb. Right inferior drain with SS fluid in bulb. Gastrostomy/Enterostomy  present of abdomen. MS: Able to move all 4 extremities.  Skin: Warm and dry.  GU: Urinary catheter present   Lab Results:  Recent Labs    08/21/24 0140 08/22/24 0437  WBC 26.3* 15.1*  HGB 14.0 7.9*  HCT 43.9 24.2*  PLT 525* 273   BMET Recent Labs    08/21/24 0140 08/22/24 0311  NA 137 142  K 4.1 3.9  CL 101 111  CO2 25 25  GLUCOSE 168* 101*  BUN 17 21*  CREATININE 0.79 0.54  CALCIUM  9.7 8.2*   PT/INR No results for input(s): LABPROT, INR in the last 72 hours. CMP     Component Value Date/Time   NA 142 08/22/2024 0311   NA 137 06/11/2022 1620   K 3.9 08/22/2024 0311   CL 111 08/22/2024 0311   CO2 25 08/22/2024 0311   GLUCOSE 101 (H) 08/22/2024 0311   BUN 21 (H) 08/22/2024 0311   BUN 20 06/11/2022 1620   CREATININE 0.54 08/22/2024 0311   CREATININE 0.88 06/05/2016 1003   CALCIUM  8.2 (L) 08/22/2024  0311   PROT 6.9 08/21/2024 0140   PROT 7.4 06/11/2022 1620   ALBUMIN  3.6 08/21/2024 0140   ALBUMIN  4.7 06/11/2022 1620   AST 18 08/21/2024 0140   ALT 6 08/21/2024 0140   ALKPHOS 60 08/21/2024 0140   BILITOT 0.5 08/21/2024 0140   BILITOT 0.4 06/11/2022 1620   GFRNONAA >60 08/22/2024 0311   GFRNONAA 84 06/05/2016 1003   GFRAA >60 01/24/2020 0253   GFRAA >89 06/05/2016 1003   Lipase     Component Value Date/Time   LIPASE 15 08/21/2024 0140       Studies/Results: CT ABDOMEN PELVIS W CONTRAST Result Date: 08/21/2024 EXAM: CT ABDOMEN AND PELVIS WITH CONTRAST 08/21/2024 03:07:05 AM TECHNIQUE: CT of the abdomen and pelvis was performed with the administration of 100 mL of iohexol  (OMNIPAQUE ) 300 MG/ML solution. Multiplanar reformatted images are provided for review. Automated exposure control, iterative reconstruction, and/or weight-based adjustment of the mA/kV was utilized to reduce the radiation dose to as low as reasonably achievable. COMPARISON: 08/09/2024 CLINICAL HISTORY: Abdominal pain, acute, nonlocalized. FINDINGS: LOWER CHEST: Lung bases are free of acute infiltrate or sizable effusion. LIVER: The liver is well visualized and within normal limits. SABRA GALLBLADDER AND BILE DUCTS: The gallbladder is within normal limits. No biliary ductal dilatation. SPLEEN: The spleen demonstrates a cystic lesion centrally similar to that noted on the prior exam. PANCREAS: The pancreas is within normal limits. ADRENAL GLANDS:  The adrenal glands are within normal limits. KIDNEYS, URETERS AND BLADDER: The kidneys demonstrate a normal enhancement pattern. No stones in the kidneys or ureters. The bladder is well distended. GI AND BOWEL: Stomach demonstrates no acute abnormality. Gastrostomy catheter is noted extending into the distal gastric remnant and subsequently into the proximal duodenum. Changes of prior gastric bypass are seen with gastrojejunostomy identified. In the area of the gastrojejunal  anastomosis, there is an apparent area of mucosal discontinuity identified and best seen on axial image number 27 of series 2 and coronal image number 49 of series 8. Adjacent air and fluid are noted anterior to this consistent with spillage of gastric contents into the abdominal cavity. This would account for the free fluid within the abdomen and pelvis. Hyperdense material is noted adjacent to the defect suspicious for thrombus. No active extravasation is noted. Small bowel loops are mildly hyperemic and thickened which may be reactive to the new free fluid within the abdomen. No obstructive or inflammatory changes of the colon are seen. The appendix is within normal limits. PERITONEUM AND RETROPERITONEUM: Perihepatic fluid is noted, new from the prior exam. Considerable free fluid is noted within the pelvis as well. Adjacent air and fluid are noted anterior to the defect at the gastrojejunal anastomosis, consistent with spillage of bowel contents into the abdominal cavity. VASCULATURE: Vascular structures appear within normal limits. LYMPH NODES: No lymphadenopathy. REPRODUCTIVE ORGANS: The uterus has been surgically removed. BONES AND SOFT TISSUES: Bony structures appear within normal limits. No focal soft tissue abnormality. IMPRESSION: 1. Mucosal discontinuity at the gastrojejunal anastomosis with adjacent air and fluid, consistent with spillage of bowel contents into the abdominal cavity. No active hemorrhage is noted. Surgical consultation is recommended. 2. Considerable free intraperitoneal fluid, greatest in the pelvis, new from the prior exam. Results were called to Dr Emil at the time of exam interpretation. Electronically signed by: Oneil Devonshire MD 08/21/2024 03:32 AM EST RP Workstation: [id number]    Anti-infectives: Anti-infectives (From admission, onward)    Start     Dose/Rate Route Frequency Ordered Stop   08/21/24 1700  metroNIDAZOLE  (FLAGYL ) IVPB 500 mg  Status:  Discontinued        500  mg 100 mL/hr over 60 Minutes Intravenous Every 12 hours 08/21/24 0740 08/21/24 0741   08/21/24 1700  metroNIDAZOLE  (FLAGYL ) IVPB 500 mg        500 mg 100 mL/hr over 60 Minutes Intravenous Every 12 hours 08/21/24 0741     08/21/24 1400  ceFEPIme  (MAXIPIME ) 2 g in sodium chloride  0.9 % 100 mL IVPB  Status:  Discontinued        2 g 200 mL/hr over 30 Minutes Intravenous Every 8 hours 08/21/24 0740 08/21/24 0741   08/21/24 1400  ceFEPIme  (MAXIPIME ) 2 g in sodium chloride  0.9 % 100 mL IVPB        2 g 200 mL/hr over 30 Minutes Intravenous Every 8 hours 08/21/24 0741     08/21/24 0345  metroNIDAZOLE  (FLAGYL ) IVPB 500 mg        500 mg 100 mL/hr over 60 Minutes Intravenous  Once 08/21/24 0334 08/21/24 0533   08/21/24 0345  ceFEPIme  (MAXIPIME ) 2 g in sodium chloride  0.9 % 100 mL IVPB        2 g 200 mL/hr over 30 Minutes Intravenous  Once 08/21/24 0334 08/21/24 0429        Assessment/Plan POD1: S/P LAPAROSCOPY, DIAGNOSTIC, LAPAROSCOPIC GRAM PATCH REPAIR OF PERFORATED MARGINAL ULCER by Dr. Tanda on 08/21/2024 -  Afebrile. Mild tachycardia during encounter. -WBC 15.1 from 26.3 -HGB 7.9 from 14.0; Reviewed with attending. Patient receiving IV fluids. Will plan to repeat labs in the AM. -Output of right lateral drain was 580 mL and right inferior drain was 315 mL from 11/24-11/25. -Patient complains of pain. Reviewed with attending and adjusted pain medications. Having minimal nausea. No BM or flatulence yet.  -Keep NPO. Consulted nutrition.  -Planning for upper GI on 08/25/2024   FEN: NPO; Sodium chloride   VTE: Heparin  injection ID: Cefepime /metronidazole     LOS: 1 day   I reviewed specialist notes, operating note, nursing notes, last 24 h vitals and pain scores, last 48 h intake and output, last 24 h labs and trends, and last 24 h imaging results.   Marjorie Carlyon Favre, [facility] 08/22/2024, 7:35 AM Please see Amion for pager number during day hours 7:00am-4:30pm

## 2024-08-23 LAB — BASIC METABOLIC PANEL WITH GFR
Anion gap: 5 (ref 5–15)
BUN: 18 mg/dL (ref 6–20)
CO2: 25 mmol/L (ref 22–32)
Calcium: 8.4 mg/dL — ABNORMAL LOW (ref 8.9–10.3)
Chloride: 111 mmol/L (ref 98–111)
Creatinine, Ser: 0.49 mg/dL (ref 0.44–1.00)
GFR, Estimated: >60 mL/min (ref >60)
Glucose, Bld: 80 mg/dL (ref 70–99)
Potassium: 3.6 mmol/L (ref 3.5–5.1)
Sodium: 140 mmol/L (ref 135–145)

## 2024-08-23 LAB — CBC
HCT: 24 % — ABNORMAL LOW (ref 36.0–46.0)
Hemoglobin: 7.7 g/dL — ABNORMAL LOW (ref 12.0–15.0)
MCH: 31.3 pg (ref 26.0–34.0)
MCHC: 32.1 g/dL (ref 30.0–36.0)
MCV: 97.6 fL (ref 80.0–100.0)
Platelets: 283 K/uL (ref 150–400)
RBC: 2.46 MIL/uL — ABNORMAL LOW (ref 3.87–5.11)
RDW: 15 % (ref 11.5–15.5)
WBC: 12 K/uL — ABNORMAL HIGH (ref 4.0–10.5)
nRBC: 0 % (ref 0.0–0.2)

## 2024-08-23 LAB — GLUCOSE, CAPILLARY
Glucose-Capillary: 75 mg/dL (ref 70–99)
Glucose-Capillary: 76 mg/dL (ref 70–99)
Glucose-Capillary: 80 mg/dL (ref 70–99)
Glucose-Capillary: 84 mg/dL (ref 70–99)
Glucose-Capillary: 91 mg/dL (ref 70–99)

## 2024-08-23 LAB — C-REACTIVE PROTEIN: CRP: 21.1 mg/dL — ABNORMAL HIGH (ref <1.0)

## 2024-08-23 LAB — MAGNESIUM: Magnesium: 2.2 mg/dL (ref 1.7–2.4)

## 2024-08-23 LAB — PHOSPHORUS: Phosphorus: 2.1 mg/dL — ABNORMAL LOW (ref 2.5–4.6)

## 2024-08-23 MED ORDER — OSMOLITE 1.5 CAL PO LIQD
1000.0000 mL | ORAL | Status: AC
  Administered 2024-08-23 – 2024-08-25 (×3): 1000 mL
  Filled 2024-08-23 (×3): qty 1000

## 2024-08-23 NOTE — Progress Notes (Signed)
 Nutrition Follow-up  DOCUMENTATION CODES:   Non-severe (moderate) malnutrition in context of chronic illness  INTERVENTION:   -Advance Osmolite 1.5 to 20 ml/hr via G-tube -Advance by 10 ml every 12 hours to goal rate of 42 ml/hr. -Goal provides 1512 kcals, 63g protein and 768 ml H2O   -Free water  recommendations if not taking in fluids or receiving IVF: -120 ml every 4 hours (720 ml)   -Vitamin labs per surgery -Awaiting results  NUTRITION DIAGNOSIS:   Moderate Malnutrition related to chronic illness, altered GI function as evidenced by severe muscle depletion, moderate fat depletion.  Ongoing.  GOAL:   Patient will meet greater than or equal to 90% of their needs  Progressing.  MONITOR:   TF tolerance, Diet advancement  ASSESSMENT:   48 y.o. 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 Just recently discharged 11/20. Readmitted now for abdominal pain and CT scan shows remnant g tube, perforated GJ tube likely at ulcer site.  11/24: s/p dx lap, lap gram patch repair of perforated marginal ulcer   Patient in room, a bit more tired this morning states she didn't sleep much last night. Pt pretty flat in affect. Agrees to increase tube feeds today to 20. Will order for advancement every 12 hours. Awaiting BM.  Awaiting results of thiamine , zinc  and copper  labs. CRP returned elevated so likely will affect results of copper  (may mask deficiency).  Admission weight: 160 lbs Current weight: 153 lbs  Medications: Zofran   Labs reviewed: CBGs: 75-91 Low Phos CRP 21.1   Diet Order:   Diet Order             Diet NPO time specified  Diet effective now                   EDUCATION NEEDS:   Education needs have been addressed  Skin:  Skin Assessment: Skin Integrity Issues: Skin Integrity Issues:: Incisions Incisions: 11/24 abdomen  Last BM:  11/18  Height:   Ht Readings from Last 1 Encounters:   08/21/24 5' 7 (1.702 m)    Weight:   Wt Readings from Last 1 Encounters:  08/23/24 69.5 kg    BMI:  Body mass index is 24 kg/m.  Estimated Nutritional Needs:   Kcal:  1500-1700  Protein:  75-90g  Fluid:  1.7L/day   Morna Lee, MS, RD, LDN Inpatient Clinical Dietitian Contact via Secure chat

## 2024-08-23 NOTE — Progress Notes (Signed)
 Progress Note  2 Days Post-Op  Subjective: Patient denies new concerns. Has not had BM or flatulence. Denies n/v.   ROS  All negative with the exception of above.  Objective: Vital signs in last 24 hours: Temp:  [97.9 F (36.6 C)-98.8 F (37.1 C)] 98.8 F (37.1 C) (11/26 0403) Pulse Rate:  [83-101] 87 (11/26 0403) Resp:  [16-18] 18 (11/26 0403) BP: (110-132)/(68-93) 132/84 (11/26 0403) SpO2:  [98 %-100 %] 99 % (11/26 0403) Weight:  [69.5 kg] 69.5 kg (11/26 0500)    Intake/Output from previous day: 11/25 0701 - 11/26 0700 In: 2648.1 [I.V.:2033.2; NG/GT:136.3; IV Piggyback:478.5] Out: 1050 [Urine:700; Drains:350] Intake/Output this shift: No intake/output data recorded.  PE: General: Pleasant female who is laying in bed in NAD. HEENT: Head is normocephalic, atraumatic. Heart: HR elevated during encounter.  Lungs: Respiratory effort nonlabored. Abd: Soft with mild distention. Generalized tenderness to palpation. Incisions c/d/I. Right superior lateral drain with SS fluid in bulb. Right inferior drain with SS fluid in bulb. Gastrostomy/Enterostomy  present of abdomen. MS: Able to move all 4 extremities.  Skin: Warm and dry.  GU: Urinary catheter present   Lab Results:  Recent Labs    08/22/24 0437 08/23/24 0145  WBC 15.1* 12.0*  HGB 7.9* 7.7*  HCT 24.2* 24.0*  PLT 273 283   BMET Recent Labs    08/22/24 0311 08/23/24 0145  NA 142 140  K 3.9 3.6  CL 111 111  CO2 25 25  GLUCOSE 101* 80  BUN 21* 18  CREATININE 0.54 0.49  CALCIUM  8.2* 8.4*   PT/INR No results for input(s): LABPROT, INR in the last 72 hours. CMP     Component Value Date/Time   NA 140 08/23/2024 0145   NA 137 06/11/2022 1620   K 3.6 08/23/2024 0145   CL 111 08/23/2024 0145   CO2 25 08/23/2024 0145   GLUCOSE 80 08/23/2024 0145   BUN 18 08/23/2024 0145   BUN 20 06/11/2022 1620   CREATININE 0.49 08/23/2024 0145   CREATININE 0.88 06/05/2016 1003   CALCIUM  8.4 (L) 08/23/2024 0145    PROT 6.9 08/21/2024 0140   PROT 7.4 06/11/2022 1620   ALBUMIN  3.6 08/21/2024 0140   ALBUMIN  4.7 06/11/2022 1620   AST 18 08/21/2024 0140   ALT 6 08/21/2024 0140   ALKPHOS 60 08/21/2024 0140   BILITOT 0.5 08/21/2024 0140   BILITOT 0.4 06/11/2022 1620   GFRNONAA >60 08/23/2024 0145   GFRNONAA 84 06/05/2016 1003   GFRAA >60 01/24/2020 0253   GFRAA >89 06/05/2016 1003   Lipase     Component Value Date/Time   LIPASE 15 08/21/2024 0140       Studies/Results: No results found.  Anti-infectives: Anti-infectives (From admission, onward)    Start     Dose/Rate Route Frequency Ordered Stop   08/21/24 1700  metroNIDAZOLE  (FLAGYL ) IVPB 500 mg  Status:  Discontinued        500 mg 100 mL/hr over 60 Minutes Intravenous Every 12 hours 08/21/24 0740 08/21/24 0741   08/21/24 1700  metroNIDAZOLE  (FLAGYL ) IVPB 500 mg        500 mg 100 mL/hr over 60 Minutes Intravenous Every 12 hours 08/21/24 0741     08/21/24 1400  ceFEPIme  (MAXIPIME ) 2 g in sodium chloride  0.9 % 100 mL IVPB  Status:  Discontinued        2 g 200 mL/hr over 30 Minutes Intravenous Every 8 hours 08/21/24 0740 08/21/24 0741   08/21/24 1400  ceFEPIme  (  MAXIPIME ) 2 g in sodium chloride  0.9 % 100 mL IVPB        2 g 200 mL/hr over 30 Minutes Intravenous Every 8 hours 08/21/24 0741     08/21/24 0345  metroNIDAZOLE  (FLAGYL ) IVPB 500 mg        500 mg 100 mL/hr over 60 Minutes Intravenous  Once 08/21/24 0334 08/21/24 0533   08/21/24 0345  ceFEPIme  (MAXIPIME ) 2 g in sodium chloride  0.9 % 100 mL IVPB        2 g 200 mL/hr over 30 Minutes Intravenous  Once 08/21/24 0334 08/21/24 0429        Assessment/Plan POD2: S/P LAPAROSCOPY, DIAGNOSTIC, LAPAROSCOPIC GRAM PATCH REPAIR OF PERFORATED MARGINAL ULCER by Dr. Tanda on 08/21/2024 -Afebrile. Tachycardia has resolved. -WBC 12.0 from 15.1 -HGB 7.7 from 7.9; Reviewed with attending. Continue to monitor at this time as patient is asymptomatic. Will plan for repeat labs in the  AM. -Output of right lateral drain was 180 mL and right inferior drain was 170 mL from 11/25-11/26. -Denies n/v. No BM or flatulence yet.  -Keep NPO.  -Nutrition consulted 11/25. Osmolite 1.5 at 10mL/hr via G-tube started. They are considering advancement by 10 mL every 12 hours to goal rate of 78mL/hr.  -Planning for upper GI on 08/25/2024   FEN: NPO; Sodium chloride ; Osmolite  VTE: Heparin  injection ID: Cefepime /metronidazole    LOS: 2 days   I reviewed specialist notes, nursing notes, last 24 h vitals and pain scores, last 48 h intake and output, last 24 h labs and trends, and last 24 h imaging results.   Marjorie Carlyon Favre, [facility] Surgery 08/23/2024, 7:34 AM Please see Amion for pager number during day hours 7:00am-4:30pm

## 2024-08-24 DIAGNOSIS — D529 Folate deficiency anemia, unspecified: Secondary | ICD-10-CM

## 2024-08-24 LAB — CBC
HCT: 23.2 % — ABNORMAL LOW (ref 36.0–46.0)
Hemoglobin: 7.5 g/dL — ABNORMAL LOW (ref 12.0–15.0)
MCH: 31 pg (ref 26.0–34.0)
MCHC: 32.3 g/dL (ref 30.0–36.0)
MCV: 95.9 fL (ref 80.0–100.0)
Platelets: 270 K/uL (ref 150–400)
RBC: 2.42 MIL/uL — ABNORMAL LOW (ref 3.87–5.11)
RDW: 14.6 % (ref 11.5–15.5)
WBC: 10.2 K/uL (ref 4.0–10.5)
nRBC: 0 % (ref 0.0–0.2)

## 2024-08-24 LAB — GLUCOSE, CAPILLARY
Glucose-Capillary: 100 mg/dL — ABNORMAL HIGH (ref 70–99)
Glucose-Capillary: 79 mg/dL (ref 70–99)
Glucose-Capillary: 81 mg/dL (ref 70–99)
Glucose-Capillary: 87 mg/dL (ref 70–99)
Glucose-Capillary: 92 mg/dL (ref 70–99)
Glucose-Capillary: 93 mg/dL (ref 70–99)
Glucose-Capillary: 99 mg/dL (ref 70–99)

## 2024-08-24 LAB — BASIC METABOLIC PANEL WITH GFR
Anion gap: 6 (ref 5–15)
BUN: 12 mg/dL (ref 6–20)
CO2: 23 mmol/L (ref 22–32)
Calcium: 8.4 mg/dL — ABNORMAL LOW (ref 8.9–10.3)
Chloride: 111 mmol/L (ref 98–111)
Creatinine, Ser: 0.45 mg/dL (ref 0.44–1.00)
GFR, Estimated: >60 mL/min (ref >60)
Glucose, Bld: 80 mg/dL (ref 70–99)
Potassium: 3.5 mmol/L (ref 3.5–5.1)
Sodium: 140 mmol/L (ref 135–145)

## 2024-08-24 LAB — MAGNESIUM: Magnesium: 2.1 mg/dL (ref 1.7–2.4)

## 2024-08-24 LAB — PREALBUMIN: Prealbumin: <5 mg/dL — ABNORMAL LOW (ref 18–38)

## 2024-08-24 LAB — VITAMIN B12: Vitamin B-12: 945 pg/mL — ABNORMAL HIGH (ref 180–914)

## 2024-08-24 LAB — PHOSPHORUS: Phosphorus: 2.4 mg/dL — ABNORMAL LOW (ref 2.5–4.6)

## 2024-08-24 MED ORDER — PHENOL 1.4 % MT LIQD: 2.0000 | OROMUCOSAL | Status: DC | PRN

## 2024-08-24 MED ORDER — ATORVASTATIN CALCIUM 20 MG PO TABS
40.0000 mg | ORAL_TABLET | Freq: Every day | ORAL | Status: DC
  Administered 2024-08-25 – 2024-08-30 (×6): 40 mg via ORAL
  Filled 2024-08-24 (×6): qty 2

## 2024-08-24 MED ORDER — FOLIC ACID 1 MG PO TABS
1.0000 mg | ORAL_TABLET | Freq: Every day | ORAL | Status: DC
  Filled 2024-08-24: qty 1

## 2024-08-24 MED ORDER — SODIUM CHLORIDE 0.9% FLUSH: 3.0000 mL | Freq: Two times a day (BID) | INTRAVENOUS | Status: DC

## 2024-08-24 MED ORDER — LINACLOTIDE 145 MCG PO CAPS
290.0000 ug | ORAL_CAPSULE | Freq: Every day | ORAL | Status: DC
  Administered 2024-08-26 – 2024-08-30 (×4): 290 ug via ORAL
  Filled 2024-08-24 (×6): qty 2

## 2024-08-24 MED ORDER — DIPHENHYDRAMINE HCL 50 MG/ML IJ SOLN: 12.5000 mg | Freq: Four times a day (QID) | INTRAMUSCULAR | Status: DC | PRN

## 2024-08-24 MED ORDER — BISACODYL 10 MG RE SUPP
10.0000 mg | Freq: Every day | RECTAL | Status: DC
  Administered 2024-08-24: 10 mg via RECTAL
  Filled 2024-08-24 (×6): qty 1

## 2024-08-24 MED ORDER — SODIUM CHLORIDE 0.9% FLUSH: 3.0000 mL | INTRAVENOUS | Status: DC | PRN

## 2024-08-24 MED ORDER — AMITRIPTYLINE HCL 25 MG PO TABS
25.0000 mg | ORAL_TABLET | Freq: Every day | ORAL | Status: DC
  Administered 2024-08-25 – 2024-08-29 (×5): 25 mg via ORAL
  Filled 2024-08-24 (×7): qty 1

## 2024-08-24 MED ORDER — MENTHOL 3 MG MT LOZG
1.0000 | LOZENGE | OROMUCOSAL | Status: DC | PRN
  Filled 2024-08-24: qty 9

## 2024-08-24 MED ORDER — ACETAMINOPHEN 500 MG PO TABS
500.0000 mg | ORAL_TABLET | Freq: Four times a day (QID) | ORAL | Status: DC
  Administered 2024-08-25: 500 mg
  Filled 2024-08-24: qty 1

## 2024-08-24 MED ORDER — LACTULOSE 10 GM/15ML PO SOLN
20.0000 g | Freq: Every day | ORAL | Status: AC
  Filled 2024-08-24: qty 30

## 2024-08-24 MED ORDER — SODIUM CHLORIDE 0.9 % IV SOLN: 250.0000 mL | INTRAVENOUS | Status: DC | PRN

## 2024-08-24 MED ORDER — GABAPENTIN 300 MG PO CAPS
600.0000 mg | ORAL_CAPSULE | Freq: Three times a day (TID) | ORAL | Status: DC
  Administered 2024-08-25 – 2024-08-30 (×16): 600 mg via ORAL
  Filled 2024-08-24 (×3): qty 6
  Filled 2024-08-24: qty 2
  Filled 2024-08-24 (×4): qty 6
  Filled 2024-08-24: qty 2
  Filled 2024-08-24 (×3): qty 6
  Filled 2024-08-24: qty 2
  Filled 2024-08-24 (×3): qty 6
  Filled 2024-08-24: qty 2

## 2024-08-24 MED ORDER — BUPROPION HCL ER (XL) 150 MG PO TB24: 300.0000 mg | ORAL_TABLET | Freq: Every morning | ORAL | Status: DC

## 2024-08-24 MED ORDER — ALUM & MAG HYDROXIDE-SIMETH 200-200-20 MG/5ML PO SUSP: 30.0000 mL | Freq: Four times a day (QID) | ORAL | Status: DC | PRN

## 2024-08-24 MED ORDER — NAPHAZOLINE-GLYCERIN 0.012-0.25 % OP SOLN: 1.0000 [drp] | Freq: Four times a day (QID) | OPHTHALMIC | Status: DC | PRN

## 2024-08-24 MED ORDER — BUPROPION HCL ER (XL) 150 MG PO TB24
450.0000 mg | ORAL_TABLET | Freq: Every day | ORAL | Status: DC
  Administered 2024-08-26 – 2024-08-30 (×5): 450 mg via ORAL
  Filled 2024-08-24 (×5): qty 3

## 2024-08-24 MED ORDER — MAGIC MOUTHWASH: 15.0000 mL | Freq: Four times a day (QID) | ORAL | Status: DC | PRN

## 2024-08-24 MED ORDER — SIMETHICONE 40 MG/0.6ML PO SUSP: 80.0000 mg | Freq: Four times a day (QID) | ORAL | Status: DC | PRN

## 2024-08-24 MED ORDER — SALINE SPRAY 0.65 % NA SOLN: 1.0000 | Freq: Four times a day (QID) | NASAL | Status: DC | PRN

## 2024-08-24 MED ORDER — DICLOFENAC SODIUM 1 % EX GEL: 2.0000 g | Freq: Four times a day (QID) | CUTANEOUS | Status: DC | PRN

## 2024-08-24 MED ORDER — CLONAZEPAM 1 MG PO TABS
1.0000 mg | ORAL_TABLET | Freq: Two times a day (BID) | ORAL | Status: DC | PRN
  Administered 2024-08-30: 1 mg via ORAL
  Filled 2024-08-24: qty 1

## 2024-08-24 MED ORDER — BUPRENORPHINE HCL 600 MCG BU FILM
600.0000 ug | ORAL_FILM | Freq: Two times a day (BID) | BUCCAL | Status: DC
  Administered 2024-08-24 – 2024-08-27 (×5): 600 ug via SUBLINGUAL
  Filled 2024-08-24 (×7): qty 1

## 2024-08-24 MED ORDER — METHOCARBAMOL 1000 MG/10ML IJ SOLN: 1000.0000 mg | Freq: Four times a day (QID) | INTRAMUSCULAR | Status: DC | PRN

## 2024-08-24 MED ORDER — LACTATED RINGERS IV BOLUS: 1000.0000 mL | Freq: Three times a day (TID) | INTRAVENOUS | Status: DC | PRN

## 2024-08-24 MED ORDER — HYDROCODONE-ACETAMINOPHEN 7.5-325 MG/15ML PO SOLN
10.0000 mL | ORAL | Status: DC | PRN
Start: 2024-08-24 — End: 2024-08-24

## 2024-08-24 MED ORDER — PROCHLORPERAZINE EDISYLATE 10 MG/2ML IJ SOLN
5.0000 mg | INTRAMUSCULAR | Status: DC | PRN
  Administered 2024-08-28: 10 mg via INTRAVENOUS
  Filled 2024-08-24: qty 2

## 2024-08-24 MED ORDER — HYDROMORPHONE HCL 1 MG/ML IJ SOLN: 1.0000 mg | INTRAMUSCULAR | Status: DC | PRN

## 2024-08-24 MED ORDER — HYDROMORPHONE HCL 1 MG/ML IJ SOLN
1.0000 mg | INTRAMUSCULAR | Status: DC | PRN
  Administered 2024-08-24 – 2024-08-25 (×15): 2 mg via INTRAVENOUS
  Administered 2024-08-26: 1 mg via INTRAVENOUS
  Administered 2024-08-26 (×2): 2 mg via INTRAVENOUS
  Administered 2024-08-27: 1 mg via INTRAVENOUS
  Administered 2024-08-27: 2 mg via INTRAVENOUS
  Administered 2024-08-27: 1 mg via INTRAVENOUS
  Administered 2024-08-28 – 2024-08-30 (×13): 2 mg via INTRAVENOUS
  Filled 2024-08-24 (×9): qty 2
  Filled 2024-08-24: qty 1
  Filled 2024-08-24 (×10): qty 2
  Filled 2024-08-24: qty 1
  Filled 2024-08-24 (×5): qty 2
  Filled 2024-08-24: qty 1
  Filled 2024-08-24 (×7): qty 2

## 2024-08-24 MED ORDER — DULOXETINE HCL 60 MG PO CPEP
60.0000 mg | ORAL_CAPSULE | Freq: Two times a day (BID) | ORAL | Status: DC
  Administered 2024-08-24 – 2024-08-30 (×11): 60 mg via ORAL
  Filled 2024-08-24 (×11): qty 1

## 2024-08-24 MED ORDER — LACTATED RINGERS IV BOLUS: 1000.0000 mL | Freq: Three times a day (TID) | INTRAVENOUS | Status: AC | PRN

## 2024-08-24 MED ORDER — BUSPIRONE HCL 5 MG PO TABS
15.0000 mg | ORAL_TABLET | Freq: Three times a day (TID) | ORAL | Status: DC
  Administered 2024-08-25 – 2024-08-30 (×16): 15 mg via ORAL
  Filled 2024-08-24 (×17): qty 3

## 2024-08-24 MED ORDER — IRON SUCROSE 200 MG IVPB - SIMPLE MED
200.0000 mg | Freq: Once | Status: AC
  Administered 2024-08-24: 200 mg via INTRAVENOUS
  Filled 2024-08-24: qty 200

## 2024-08-24 MED ORDER — HYDROMORPHONE HCL 2 MG PO TABS
4.0000 mg | ORAL_TABLET | Freq: Four times a day (QID) | ORAL | Status: DC | PRN
  Administered 2024-08-26 – 2024-08-30 (×8): 4 mg via ORAL
  Filled 2024-08-24 (×8): qty 2

## 2024-08-24 MED ORDER — OXYBUTYNIN CHLORIDE ER 5 MG PO TB24
5.0000 mg | ORAL_TABLET | Freq: Every day | ORAL | Status: DC
  Administered 2024-08-26 – 2024-08-30 (×5): 5 mg via ORAL
  Filled 2024-08-24 (×5): qty 1

## 2024-08-24 MED ORDER — DICYCLOMINE HCL 10 MG PO CAPS
10.0000 mg | ORAL_CAPSULE | Freq: Every day | ORAL | Status: DC
  Administered 2024-08-25 – 2024-08-29 (×5): 10 mg via ORAL
  Filled 2024-08-24 (×6): qty 1

## 2024-08-24 NOTE — Progress Notes (Signed)
 There was a consult for DBIV, but patient is getting iron  via Midline. Informed patient's RN that it couldn't do DBIV due to infusing iron  via Midline. Patient's RN stated that it is not urgent lab, it's okay to do routine lab tomorrow. Will inform night shift IV nurse. HS [employer]

## 2024-08-24 NOTE — Progress Notes (Addendum)
 08/24/2024  [name] [id number] 05-May-1976  CARE TEAM: PCP: Orion Kerns, MD  Outpatient Care Team: Patient Care Team: Orion Kerns, MD as PCP - General (General Practice) Anner Alm ORN, MD as PCP - Cardiology (Cardiology) Ernie Cough, MD as Consulting Physician (Orthopedic Surgery) Avram Lupita BRAVO, MD as Consulting Physician (Gastroenterology) Ernie Cough, MD as Consulting Physician (Orthopedic Surgery) Scotece, Thersia NOVAK, RD as Dietitian (Dietician) Fredirick Glenys RAMAN, MD as Consulting Physician (Obstetrics and Gynecology) Stechschulte, Deward PARAS, MD as Consulting Physician (Surgery) Saintclair Jasper, MD as Consulting Physician (Gastroenterology)  Inpatient Treatment Team: Treatment Team:  Henderson, Md, MD Ccs, Md, MD Joannie Leader, [place] Dusty Garvin CROME, RN Kriste Asberry BRAVO, RN MahabirNathanel, RN   Problem List:   Principal Problem:   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 Active Problems:   Chronic headache disorder   Protein-calorie malnutrition, moderate   Chronic constipation   MDD (major depressive disorder), recurrent severe, without psychosis (HCC)   Gait instability   Acquired spondylolysis   Chronic low back pain   History of urinary urgency   Folate deficiency anemia   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   08/21/2024  PRE-OPERATIVE DIAGNOSIS:   PERFORATED MARGINAL ULCER 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 Tanda on 08/21/2024 -Afebrile.  -WBC from 26.3 to normal (10.2) -Plan upper GI esophagram tomorrow.  If no evidence of any leak or obstruction can gradually advance bariatric liquids -N.p.o. except for sips/ice chips.  NG tube if worse. -CT scan on postop day 5 if not better or markedly worsened.  Hopefully not too likely at this point -Still with significant drainage of Blake surgical drains although looks thinly serous.  Send for amylase. -Tube feed through gastric remnant.  Gradually increase to goal.  Patient claims to normally have nightly tube feeds -HGB low with iron  deficiency anemia and folate deficiency.  Enteral folate.  Give IV iron .  Follow -Add bowel regimen.  Will do liquid lactulose  for now to help discourage any further constipation.  Seems like she is often on Linzess  as well.  I want to hold off on that for now but may need to reconsider. -Try and get up and mobilize some more as possible.  History of walker and falls.  Physical therapy evaluation to see if needs home health or other issues   Multimodal pain control.  Patient does have allergies to some narcotics and has a strong preference for her home regimen.  Will try and gradually restart that through her G-tube.  Sensing there is resistance to this.  See if we can wean her off IV narcotics since she refuses to go less than 2 hours without getting an IV shot.  FEN: NPO; advancing tube feeds to goal.  DC IV fluids with PRN (as needed) backup.  Bowel regimen VTE: Heparin  every 8 hours subcu prophylaxis  ID: Cefepime /metronidazole  x 5d postop = finish 11/29  Try and get her up and mobilize more.  See  if walking assistance and physical therapy can make sure it is safe.  I updated the patient's status to the patient  Recommendations were made.  Questions were answered.  She expressed understanding & appreciation.  -Disposition:  Disposition:  The patient is from:  Home Anticipate discharge to:  Home with Home Health Anticipated Date of Discharge is:  November 30,2025   Barriers to discharge:  Pending Clinical improvement (more likely than not), Therapy assessment & Recommendations pending, Need for inpatient procedure/study, Testing result pending, Consultant clearance & sign off  , Transitions of Care, and Social/Financial Barriers  Patient currently is NOT MEDICALLY STABLE for discharge from the hospital from a surgery standpoint.      I reviewed nursing notes, last 24 h vitals and pain scores, last 48 h intake and output, last 24 h labs and trends, and last 24 h imaging results.  I have reviewed this patient's available data, including medical history, events of note, test results, etc as part of my evaluation.   A significant portion of that time was spent in counseling. Care during the described time interval was provided by me.  This care required moderate level of medical decision making.  08/24/2024    Subjective: (Chief complaint)  Patient with some soreness but a little better controlled.  Asking for IV Dilaudid .  Wanting to know if she can get her oral absorbable narcotic that she takes at home.  Denies any nausea.  Thirsty/hungry.  Trying to get out of bed some.  Some crampiness in abdomen  Objective:  Vital signs:  Vitals:   08/23/24 1337 08/23/24 1754 08/23/24 2030 08/24/24 0435  BP: (!) 125/90 134/87 123/79 131/87  Pulse: 77 77 85 87  Resp: 16 16 16 15   Temp: 98.1 F (36.7 C) 98.1 F (36.7 C) 98.2 F (36.8 C) 98.8 F (37.1 C)  TempSrc: Oral Oral Oral Oral  SpO2: 100% 100% 99% 98%  Weight:      Height:           Intake/Output   Yesterday:  11/26 0701 - 11/27 0700 In: 2113.9 [P.O.:30; I.V.:1783.9; IV Piggyback:300] Out: 955 [Urine:800; Drains:155] This shift:  Total I/O In: 120 [P.O.:120] Out: 220 [Drains:220]  Bowel function:  Flatus: No  BM:  No  Drain: Serous   Physical Exam:  General:  Pt awake/alert in no acute distress Eyes: PERRL, normal EOM.  Sclera clear.  No icterus Neuro: CN II-XII intact w/o focal sensory/motor deficits. Lymph: No head/neck/groin lymphadenopathy Psych:  No delerium/psychosis/paranoia.  Oriented x 4 HENT: Normocephalic, Mucus membranes moist.  No thrush Neck: Supple, No tracheal deviation.  No obvious thyromegaly Chest: No pain to chest wall compression.  Good respiratory excursion.  No audible wheezing CV:  Pulses intact.  Regular rhythm.  No major extremity edema MS: Normal AROM mjr joints.  No obvious deformity  Abdomen: Soft.  Mildy distended.  Mildly tender at incisions only.  No evidence of peritonitis.  No incarcerated hernias. Gastrostomy tube in place without any obvious leak or irritation  Ext:  No deformity.  No mjr edema.  No cyanosis Skin: No petechiae / purpurea.  No major sores.  Warm and dry    Results:   Cultures: Recent Results (from the past 720 hours)  Respiratory (~20 pathogens) panel by PCR     Status: None   Collection Time: 08/13/24 12:31 PM   Specimen: Nasopharyngeal Swab; Respiratory  Result Value Ref Range Status   Adenovirus NOT DETECTED NOT DETECTED Final   Coronavirus  229E NOT DETECTED NOT DETECTED Final    Comment: (NOTE) The Coronavirus on the Respiratory Panel, DOES NOT test for the novel  Coronavirus (2019 nCoV)    Coronavirus HKU1 NOT DETECTED NOT DETECTED Final   Coronavirus NL63 NOT DETECTED NOT DETECTED Final   Coronavirus OC43 NOT DETECTED NOT DETECTED Final   Metapneumovirus NOT DETECTED NOT DETECTED Final   Rhinovirus / Enterovirus NOT DETECTED NOT DETECTED Final   Influenza A NOT DETECTED NOT DETECTED Final   Influenza B NOT DETECTED NOT DETECTED Final   Parainfluenza Virus 1 NOT DETECTED NOT DETECTED Final   Parainfluenza Virus 2 NOT DETECTED NOT DETECTED Final   Parainfluenza Virus 3 NOT DETECTED NOT DETECTED Final   Parainfluenza Virus 4 NOT DETECTED NOT DETECTED Final   Respiratory  Syncytial Virus NOT DETECTED NOT DETECTED Final   Bordetella pertussis NOT DETECTED NOT DETECTED Final   Bordetella Parapertussis NOT DETECTED NOT DETECTED Final   Chlamydophila pneumoniae NOT DETECTED NOT DETECTED Final   Mycoplasma pneumoniae NOT DETECTED NOT DETECTED Final    Comment: Performed at [facility], [address], [place], [place] [address]  Resp panel by RT-PCR (RSV, Flu A&B, Covid) Anterior Nasal Swab     Status: None   Collection Time: 08/13/24 12:31 PM   Specimen: Anterior Nasal Swab  Result Value Ref Range Status   SARS Coronavirus 2 by RT PCR NEGATIVE NEGATIVE Final    Comment: (NOTE) SARS-CoV-2 target nucleic acids are NOT DETECTED.  The SARS-CoV-2 RNA is generally detectable in upper respiratory specimens during the acute phase of infection. The lowest concentration of SARS-CoV-2 viral copies this assay can detect is 138 copies/mL. A negative result does not preclude SARS-Cov-2 infection and should not be used as the sole basis for treatment or other patient management decisions. A negative result may occur with  improper specimen collection/handling, submission of specimen other than nasopharyngeal swab, presence of viral mutation(s) within the areas targeted by this assay, and inadequate number of viral copies(<138 copies/mL). A negative result must be combined with clinical observations, patient history, and epidemiological information. The expected result is Negative.  Fact Sheet for Patients:  bloggercourse.com  Fact Sheet for Healthcare Providers:  seriousbroker.it  This test is no t yet approved or cleared by the [place]  FDA and  has been authorized for detection and/or diagnosis of SARS-CoV-2 by FDA under an Emergency Use Authorization (EUA). This EUA will remain  in effect (meaning this test can be used) for the duration of the COVID-19 declaration under Section 564(b)(1) of the Act,  21 U.S.C.section 360bbb-3(b)(1), unless the authorization is terminated  or revoked sooner.       Influenza A by PCR NEGATIVE NEGATIVE Final   Influenza B by PCR NEGATIVE NEGATIVE Final    Comment: (NOTE) The Xpert Xpress SARS-CoV-2/FLU/RSV plus assay is intended as an aid in the diagnosis of influenza from Nasopharyngeal swab specimens and should not be used as a sole basis for treatment. Nasal washings and aspirates are unacceptable for Xpert Xpress SARS-CoV-2/FLU/RSV testing.  Fact Sheet for Patients: bloggercourse.com  Fact Sheet for Healthcare Providers: seriousbroker.it  This test is not yet approved or cleared by the [place]  FDA and has been authorized for detection and/or diagnosis of SARS-CoV-2 by FDA under an Emergency Use Authorization (EUA). This EUA will remain in effect (meaning this test can be used) for the duration of the COVID-19 declaration under Section 564(b)(1) of the Act, 21 U.S.C. section 360bbb-3(b)(1), unless the authorization is terminated  or revoked.     Resp Syncytial Virus by PCR NEGATIVE NEGATIVE Final    Comment: (NOTE) Fact Sheet for Patients: bloggercourse.com  Fact Sheet for Healthcare Providers: seriousbroker.it  This test is not yet approved or cleared by the [place]  FDA and has been authorized for detection and/or diagnosis of SARS-CoV-2 by FDA under an Emergency Use Authorization (EUA). This EUA will remain in effect (meaning this test can be used) for the duration of the COVID-19 declaration under Section 564(b)(1) of the Act, 21 U.S.C. section 360bbb-3(b)(1), unless the authorization is terminated or revoked.  Performed at [facility], [address] [place], [place] [address]   Culture, blood (Routine X 2) w Reflex to ID Panel     Status: None   Collection Time: 08/14/24 10:28 AM   Specimen:  BLOOD  Result Value Ref Range Status   Specimen Description   Final    BLOOD BLOOD LEFT ARM Performed at [facility], [address], [place], [place] [address]    Special Requests   Final    BOTTLES DRAWN AEROBIC AND ANAEROBIC Blood Culture adequate volume Performed at [facility], [address], [place], [place] [address]    Culture   Final    NO GROWTH 5 DAYS Performed at [facility], [address] [place], [place] [address]    Report Status 08/19/2024 FINAL  Final  Culture, blood (Routine X 2) w Reflex to ID Panel     Status: None   Collection Time: 08/14/24 10:34 AM   Specimen: BLOOD  Result Value Ref Range Status   Specimen Description   Final    BLOOD BLOOD LEFT HAND Performed at [facility], [address], [place], [place] [address]    Special Requests   Final    BOTTLES DRAWN AEROBIC ONLY Blood Culture adequate volume Performed at [facility], [address], [place], [place] [address]    Culture   Final    NO GROWTH 5 DAYS Performed at [facility], [address] [place], [place] [address]    Report Status 08/19/2024 FINAL  Final    Labs: Results for orders placed or performed during the hospital encounter of 08/21/24 (from the past 48 hours)  Glucose, capillary     Status: None   Collection Time: 08/22/24  7:35 PM  Result Value Ref Range   Glucose-Capillary 78 70 - 99 mg/dL    Comment: Glucose reference range applies only to samples taken after fasting for at least 8 hours.  Glucose, capillary     Status: None   Collection Time: 08/23/24 12:05 AM  Result Value Ref Range   Glucose-Capillary 76 70 - 99 mg/dL    Comment: Glucose reference range applies only to samples taken after fasting for at least 8 hours.  Basic metabolic panel     Status: Abnormal   Collection Time: 08/23/24  1:45 AM  Result Value Ref Range   Sodium 140 135 - 145 mmol/L   Potassium 3.6 3.5  - 5.1 mmol/L   Chloride 111 98 - 111 mmol/L   CO2 25 22 - 32 mmol/L   Glucose, Bld 80 70 - 99 mg/dL    Comment: Glucose reference range applies only to samples taken after fasting for at least 8 hours.   BUN 18 6 - 20 mg/dL   Creatinine, Ser 9.50 0.44 - 1.00 mg/dL   Calcium  8.4 (L) 8.9 -  10.3 mg/dL   GFR, Estimated >39 >39 mL/min    Comment: (NOTE) Calculated using the CKD-EPI Creatinine Equation (2021)    Anion gap 5 5 - 15    Comment: Performed at [facility], [address] [place], [place] [address]  CBC     Status: Abnormal   Collection Time: 08/23/24  1:45 AM  Result Value Ref Range   WBC 12.0 (H) 4.0 - 10.5 K/uL   RBC 2.46 (L) 3.87 - 5.11 MIL/uL   Hemoglobin 7.7 (L) 12.0 - 15.0 g/dL   HCT 75.9 (L) 63.9 - 53.9 %   MCV 97.6 80.0 - 100.0 fL   MCH 31.3 26.0 - 34.0 pg   MCHC 32.1 30.0 - 36.0 g/dL   RDW 84.9 88.4 - 84.4 %   Platelets 283 150 - 400 K/uL   nRBC 0.0 0.0 - 0.2 %    Comment: Performed at [facility], [address] [place], [place] [address]  Magnesium      Status: None   Collection Time: 08/23/24  1:45 AM  Result Value Ref Range   Magnesium  2.2 1.7 - 2.4 mg/dL    Comment: Performed at [facility], [address] [place], [place] [address]  Phosphorus     Status: Abnormal   Collection Time: 08/23/24  1:45 AM  Result Value Ref Range   Phosphorus 2.1 (L) 2.5 - 4.6 mg/dL    Comment: Performed at [facility], [address], [place], [place] [address]  C-reactive protein     Status: Abnormal   Collection Time: 08/23/24  1:45 AM  Result Value Ref Range   CRP 21.1 (H) <1.0 mg/dL    Comment: Performed at [facility], [address], [place], [place] [address]  Glucose, capillary     Status: None   Collection Time: 08/23/24  3:57 AM  Result Value Ref Range   Glucose-Capillary 75 70 - 99 mg/dL    Comment: Glucose reference range applies only to samples taken after fasting for at  least 8 hours.  Glucose, capillary     Status: None   Collection Time: 08/23/24 11:19 AM  Result Value Ref Range   Glucose-Capillary 91 70 - 99 mg/dL    Comment: Glucose reference range applies only to samples taken after fasting for at least 8 hours.  Glucose, capillary     Status: None   Collection Time: 08/23/24  4:15 PM  Result Value Ref Range   Glucose-Capillary 80 70 - 99 mg/dL    Comment: Glucose reference range applies only to samples taken after fasting for at least 8 hours.  Glucose, capillary     Status: None   Collection Time: 08/23/24  8:31 PM  Result Value Ref Range   Glucose-Capillary 84 70 - 99 mg/dL    Comment: Glucose reference range applies only to samples taken after fasting for at least 8 hours.  Glucose, capillary     Status: None   Collection Time: 08/24/24 12:46 AM  Result Value Ref Range   Glucose-Capillary 79 70 - 99 mg/dL    Comment: Glucose reference range applies only to samples taken after fasting for at least 8 hours.  Basic metabolic panel     Status: Abnormal   Collection Time: 08/24/24  3:05 AM  Result Value Ref Range   Sodium 140 135 - 145 mmol/L   Potassium 3.5 3.5 - 5.1 mmol/L   Chloride 111 98 - 111 mmol/L  CO2 23 22 - 32 mmol/L   Glucose, Bld 80 70 - 99 mg/dL    Comment: Glucose reference range applies only to samples taken after fasting for at least 8 hours.   BUN 12 6 - 20 mg/dL   Creatinine, Ser 9.54 0.44 - 1.00 mg/dL   Calcium  8.4 (L) 8.9 - 10.3 mg/dL   GFR, Estimated >39 >39 mL/min    Comment: (NOTE) Calculated using the CKD-EPI Creatinine Equation (2021)    Anion gap 6 5 - 15    Comment: Performed at [facility], [address], [place], [place] [address]  CBC     Status: Abnormal   Collection Time: 08/24/24  3:05 AM  Result Value Ref Range   WBC 10.2 4.0 - 10.5 K/uL   RBC 2.42 (L) 3.87 - 5.11 MIL/uL   Hemoglobin 7.5 (L) 12.0 - 15.0 g/dL   HCT 76.7 (L) 63.9 - 53.9 %   MCV 95.9 80.0 - 100.0 fL   MCH 31.0  26.0 - 34.0 pg   MCHC 32.3 30.0 - 36.0 g/dL   RDW 85.3 88.4 - 84.4 %   Platelets 270 150 - 400 K/uL   nRBC 0.0 0.0 - 0.2 %    Comment: Performed at [facility], [address], [place], [place] [address]  Magnesium      Status: None   Collection Time: 08/24/24  3:05 AM  Result Value Ref Range   Magnesium  2.1 1.7 - 2.4 mg/dL    Comment: Performed at [facility], [address], [place], [place] [address]  Phosphorus     Status: Abnormal   Collection Time: 08/24/24  3:05 AM  Result Value Ref Range   Phosphorus 2.4 (L) 2.5 - 4.6 mg/dL    Comment: Performed at [facility], [address] [place], [place] [address]  Glucose, capillary     Status: None   Collection Time: 08/24/24  4:36 AM  Result Value Ref Range   Glucose-Capillary 92 70 - 99 mg/dL    Comment: Glucose reference range applies only to samples taken after fasting for at least 8 hours.  Glucose, capillary     Status: None   Collection Time: 08/24/24  7:35 AM  Result Value Ref Range   Glucose-Capillary 81 70 - 99 mg/dL    Comment: Glucose reference range applies only to samples taken after fasting for at least 8 hours.    Imaging / Studies: No results found.  Medications / Allergies: per chart  Antibiotics: Anti-infectives (From admission, onward)    Start     Dose/Rate Route Frequency Ordered Stop   08/21/24 1700  metroNIDAZOLE  (FLAGYL ) IVPB 500 mg  Status:  Discontinued        500 mg 100 mL/hr over 60 Minutes Intravenous Every 12 hours 08/21/24 0740 08/21/24 0741   08/21/24 1700  metroNIDAZOLE  (FLAGYL ) IVPB 500 mg        500 mg 100 mL/hr over 60 Minutes Intravenous Every 12 hours 08/21/24 0741     08/21/24 1400  ceFEPIme  (MAXIPIME ) 2 g in sodium chloride  0.9 % 100 mL IVPB  Status:  Discontinued        2 g 200 mL/hr over 30 Minutes Intravenous Every 8 hours 08/21/24 0740 08/21/24 0741   08/21/24 1400  ceFEPIme  (MAXIPIME ) 2 g in sodium chloride  0.9 % 100  mL IVPB        2 g 200 mL/hr over 30 Minutes Intravenous Every 8 hours 08/21/24  9258 08/26/24 1359   08/21/24 0345  metroNIDAZOLE  (FLAGYL ) IVPB 500 mg        500 mg 100 mL/hr over 60 Minutes Intravenous  Once 08/21/24 0334 08/21/24 0533   08/21/24 0345  ceFEPIme  (MAXIPIME ) 2 g in sodium chloride  0.9 % 100 mL IVPB        2 g 200 mL/hr over 30 Minutes Intravenous  Once 08/21/24 9665 08/21/24 0429         Note: Portions of this report may have been transcribed using voice recognition software. Every effort was made to ensure accuracy; however, inadvertent computerized transcription errors may be present.   Any transcriptional errors that result from this process are unintentional.    Elspeth KYM Schultze, MD, FACS, MASCRS Esophageal, Gastrointestinal & Colorectal Surgery Robotic and Minimally Invasive Surgery  [facility] [address] [place], [place] [address] [phone] Fax [phone] Main  CONTACT INFORMATION: Weekday (9AM-5PM): Call [facility] main office at [phone] Weeknight (5PM-9AM) or Weekend/Holiday: Check EPIC Web Links tab & use AMION (password  [email or web address]) for General Surgery [facility] coverage  Please, DO NOT use SecureChat  (it is not reliable communication to reach operating surgeons & will lead to a delay in care).   Epic staff messaging available for outpatient concerns needing 1-2 business day response.      08/24/2024  11:02 AM

## 2024-08-25 ENCOUNTER — Inpatient Hospital Stay (HOSPITAL_COMMUNITY)

## 2024-08-25 LAB — NICOTINE/COTININE METABOLITES
Cotinine: <1 ng/mL
Nicotine: <1 ng/mL

## 2024-08-25 LAB — COPPER, SERUM: Copper: 67 ug/dL — ABNORMAL LOW (ref 80–158)

## 2024-08-25 LAB — GLUCOSE, CAPILLARY
Glucose-Capillary: 100 mg/dL — ABNORMAL HIGH (ref 70–99)
Glucose-Capillary: 105 mg/dL — ABNORMAL HIGH (ref 70–99)
Glucose-Capillary: 126 mg/dL — ABNORMAL HIGH (ref 70–99)
Glucose-Capillary: 138 mg/dL — ABNORMAL HIGH (ref 70–99)
Glucose-Capillary: 90 mg/dL (ref 70–99)
Glucose-Capillary: 95 mg/dL (ref 70–99)

## 2024-08-25 LAB — PHOSPHORUS: Phosphorus: 2.4 mg/dL — ABNORMAL LOW (ref 2.5–4.6)

## 2024-08-25 LAB — MAGNESIUM: Magnesium: 1.9 mg/dL (ref 1.7–2.4)

## 2024-08-25 MED ORDER — ACETAMINOPHEN 500 MG PO TABS
500.0000 mg | ORAL_TABLET | Freq: Four times a day (QID) | ORAL | Status: DC
  Administered 2024-08-25 – 2024-08-30 (×18): 500 mg via ORAL
  Filled 2024-08-25 (×18): qty 1

## 2024-08-25 MED ORDER — FOLIC ACID 1 MG PO TABS
1.0000 mg | ORAL_TABLET | Freq: Every day | ORAL | Status: DC
  Administered 2024-08-25 – 2024-08-26 (×2): 1 mg
  Filled 2024-08-25: qty 1

## 2024-08-25 MED ORDER — FOLIC ACID 5 MG/ML IJ SOLN
1.0000 mg | Freq: Every day | INTRAMUSCULAR | Status: DC
  Filled 2024-08-25 (×2): qty 0.2

## 2024-08-25 MED ORDER — OSMOLITE 1.5 CAL PO LIQD
1000.0000 mL | ORAL | Status: DC
  Administered 2024-08-27 – 2024-08-29 (×3): 1000 mL
  Filled 2024-08-25 (×4): qty 1000

## 2024-08-25 MED ORDER — OSMOLITE 1.5 CAL PO LIQD
1000.0000 mL | ORAL | Status: AC
  Administered 2024-08-26: 1000 mL
  Filled 2024-08-25 (×2): qty 1000

## 2024-08-25 NOTE — Progress Notes (Signed)
 OT Cancellation Note  Patient Details Name: [name] MRN: [id number] DOB: Jun 03, 1976   Cancelled Treatment:    Reason Eval/Treat Not Completed: Patient at procedure or test/ unavailable  Will continue to follow for initial OT evaluation when patient available/schedule allows.   Shereen Marton OT/L Acute Rehabilitation Department  [phone]    08/25/2024, 10:05 AM

## 2024-08-25 NOTE — Evaluation (Signed)
 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  Pt currently with functional limitations due to the deficits listed below (see PT Problem List). Pt in bed when therapist arrived. Pt agreeable to therapy intervention. Pt currently feeding via G-tube. Pt reported drainage from most lateral of R JP drains. Pt required cues and increased time for supine to sit with abdominal precautions and use of hospital bed features. Pt required CGA for sit to stand  from EOB to RW, gait in hallway 90 feet with RW, cues and CGA with noted intermittent R knee instability, pt returned to room and seated in recliner with all needs in place. Nursing aware of drainage and re-dressed drain site during eval.  Pt will benefit from acute skilled PT to increase their independence and safety with mobility to allow discharge.         If plan is discharge home, recommend the following: A little help with walking and/or transfers;A little help with bathing/dressing/bathroom;Assistance with cooking/housework;Assist for transportation;Help with stairs or ramp for entrance   Can travel by private vehicle        Equipment Recommendations Rollator (4 wheels)  Recommendations for Other Services       Functional Status Assessment Patient has had a recent decline in their functional status and demonstrates the ability to make significant improvements in function in a reasonable and predictable amount of time.     Precautions / Restrictions  Precautions Precautions: Fall Precaution/Restrictions Comments: abdominal, PICC, feeding tube, 2 JP drains R Restrictions Weight Bearing Restrictions Per Provider Order: No      Mobility  Bed Mobility Overal bed mobility: Needs Assistance Bed Mobility: Supine to Sit     Supine to sit: Contact guard, HOB elevated     General bed mobility comments: cues for abdominal precuations, HOB elevated due to tube feeding at time of eval    Transfers Overall transfer level: Needs assistance Equipment used: Rolling walker (2 wheels) Transfers: Sit to/from Stand Sit to Stand: Contact guard assist           General transfer comment: min cues    Ambulation/Gait Ambulation/Gait assistance: Contact guard assist Gait Distance (Feet): 90 Feet Assistive device: Rolling walker (2 wheels) Gait Pattern/deviations: Step-through pattern, Antalgic, Trunk flexed Gait velocity: decreased     General Gait Details: slight trunk flexion, occational R knee instabiltiy, however pt able to use b UE support at RW and no overt LOB pt has a strong fall hx  Stairs            Wheelchair Mobility     Tilt Bed    Modified Rankin (Stroke Patients Only)       Balance Overall balance assessment: Needs assistance, History of Falls Sitting-balance support: Feet supported Sitting balance-Leahy Scale: Good     Standing balance support: Bilateral upper extremity supported, During functional activity, Reliant on assistive device for balance Standing balance-Leahy Scale: Poor  Pertinent Vitals/Pain Pain Assessment Pain Assessment: Faces Faces Pain Scale: Hurts even more Pain Location: abdomen, R knee L shoulder and LBP    Home Living Family/patient expects to be discharged to:: Private residence Living Arrangements: Children Available Help at Discharge: Family Type of Home: Apartment Home Access: Stairs to enter Entrance Stairs-Rails:  None Entrance Stairs-Number of Steps: 1 at the door and one to access apartment complex (2 non sequential steps)   Home Layout: One level Home Equipment: Cane - single Librarian, Academic (2 wheels);Rollator (4 wheels);Shower seat Additional Comments: pt would like a rollator; 18yo and 66 yo sons live with her, 41 yo in [place]    Prior Function Prior Level of Function : History of Falls (last six months);Needs assist       Physical Assist : Mobility (physical);ADLs (physical) Mobility (physical): Stairs;Gait ADLs (physical): Bathing;IADLs;Dressing;Grooming Mobility Comments: recent fall last week, uses rollator and cane depending on how she feel ADLs Comments: kids assist with all cooking and cleaning.  Kids assist helping  into shower and sometimes help with bathing and dressing. Pt sometimes drives     Extremity/Trunk Assessment   Upper Extremity Assessment Upper Extremity Assessment: Defer to OT evaluation    Lower Extremity Assessment Lower Extremity Assessment: Overall WFL for tasks assessed (occational R knee instability)    Cervical / Trunk Assessment Cervical / Trunk Assessment: Normal  Communication   Communication Communication: No apparent difficulties    Cognition Arousal: Alert Behavior During Therapy: WFL for tasks assessed/performed   PT - Cognitive impairments: No apparent impairments                         Following commands: Intact       Cueing Cueing Techniques: Verbal cues     General Comments General comments (skin integrity, edema, etc.): R lateral most drain site weeping, nurse aware and re-dressed    Exercises     Assessment/Plan    PT Assessment Patient needs continued PT services  PT Problem List Decreased strength;Decreased activity tolerance;Decreased balance;Decreased mobility;Decreased coordination;Pain       PT Treatment Interventions DME instruction;Gait training;Stair training;Functional mobility training;Therapeutic  activities;Therapeutic exercise;Balance training;Neuromuscular re-education;Patient/family education    PT Goals (Current goals can be found in the Care Plan section)  Acute Rehab PT Goals Patient Stated Goal: to be able to get home to the boys and dogs PT Goal Formulation: With patient Time For Goal Achievement: 09/08/24 Potential to Achieve Goals: Good    Frequency Min 3X/week     Co-evaluation PT/OT/SLP Co-Evaluation/Treatment: Yes Reason for Co-Treatment: Complexity of the patient's impairments (multi-system involvement);To address functional/ADL transfers;For patient/therapist safety;Necessary to address cognition/behavior during functional activity PT goals addressed during session: Mobility/safety with mobility;Balance;Proper use of DME OT goals addressed during session: ADL's and self-care;Proper use of Adaptive equipment and DME       AM-PAC PT 6 Clicks Mobility  Outcome Measure Help needed turning from your back to your side while in a flat bed without using bedrails?: None Help needed moving from lying on your back to sitting on the side of a flat bed without using bedrails?: A Little Help needed moving to and from a bed to a chair (including a wheelchair)?: A Little Help needed standing up from a chair using your arms (e.g., wheelchair or bedside chair)?: A Little Help needed to walk in hospital room?: A Little Help needed climbing 3-5 steps with a railing? : A Lot 6 Click Score: 18  End of Session Equipment Utilized During Treatment: Gait belt Activity Tolerance: Patient limited by fatigue Patient left: in chair;with call bell/phone within reach Nurse Communication: Mobility status;Other (comment) (drain site weeping) PT Visit Diagnosis: Unsteadiness on feet (R26.81);Other abnormalities of gait and mobility (R26.89);Muscle weakness (generalized) (M62.81);Difficulty in walking, not elsewhere classified (R26.2);Repeated falls (R29.6);Pain Pain - Right/Left:  (L UE,  R LE, abdominal and LBP)    Time: 8557-8471 PT Time Calculation (min) (ACUTE ONLY): 46 min   Charges:   PT Evaluation $PT Eval Low Complexity: 1 Low PT Treatments $Gait Training: 8-22 mins PT General Charges $$ ACUTE PT VISIT: 1 Visit         [place], PT Acute Rehab   [place] VEAR Drone 08/25/2024, 4:38 PM

## 2024-08-25 NOTE — Progress Notes (Addendum)
 Nutrition Follow-up  DOCUMENTATION CODES:   Non-severe (moderate) malnutrition in context of chronic illness  INTERVENTION:   -Continue Osmolite 1.5 @ 42 ml/hr via G-tube -Goal provides 1512 kcals, 63g protein and 768 ml H2O   -Free water  recommendations if not taking in fluids or receiving IVF: -120 ml every 4 hours (720 ml)   -Vitamin labs per surgery -Awaiting results  **Addendum 1245: Received consult to start transitioning back to night feed regimen.  11/28: Will let tube feeds run at 42 ml/hr until 10 am tomorrow.  11/29: Run Osmolite 1.5 @ 50 ml/hr x 14 hours (2200-1200)  11/30: Run Osmolite 1.5 @ 60 ml/hr x 14 hours (2200-1200) -Provides 1260 kcals, 52g protein and 640 ml H2O   After discharge patient can resume home regimen of: -Two Cal HN @ 50 ml/hr x 14 hours via G-tube -Provides 1425 kcals, 59g protein and 498 ml H2O -Free water : 100 ml every 4 hours -PO as tolerated, Fairlife shakes BID -Resume vitamin regimen  NUTRITION DIAGNOSIS:   Moderate Malnutrition related to chronic illness, altered GI function as evidenced by severe muscle depletion, moderate fat depletion.  Ongoing.  GOAL:   Patient will meet greater than or equal to 90% of their needs  Meeting with TF  MONITOR:   TF tolerance, Diet advancement  ASSESSMENT:   48 y.o. 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 Just recently discharged 11/20. Readmitted now for abdominal pain and CT scan shows remnant g tube, perforated GJ tube likely at ulcer site.  11/24: s/p dx lap, lap gram patch repair of perforated marginal ulcer   Pt having UGI today.  Should be receiving goal rate of tube feeds today.  Discussed adding IV folic acid  with Pharmacy given pt's very low folate levels.  Other vitamin labs reordered by surgery, still awaiting results. Will monitor.  Admission weight: 160 lbs Last weight 11/26: 153 lbs Daily weights are  ordered.  Medications: Dulcolax, Bentyl , Folic acid , Lactulose , Linzess   Labs reviewed: CBGs: 105 Low Phos   Diet Order:   Diet Order             Diet NPO time specified Except for: Ice Chips  Diet effective now                   EDUCATION NEEDS:   Education needs have been addressed  Skin:  Skin Assessment: Skin Integrity Issues: Skin Integrity Issues:: Incisions Incisions: 11/24 abdomen  Last BM:  11/18  Height:   Ht Readings from Last 1 Encounters:  08/21/24 5' 7 (1.702 m)    Weight:   Wt Readings from Last 1 Encounters:  08/23/24 69.5 kg    BMI:  Body mass index is 24 kg/m.  Estimated Nutritional Needs:   Kcal:  1500-1700  Protein:  75-90g  Fluid:  1.7L/day   Morna Lee, MS, RD, LDN Inpatient Clinical Dietitian Contact via Secure chat

## 2024-08-25 NOTE — Plan of Care (Signed)

## 2024-08-25 NOTE — Progress Notes (Signed)
 4 Days Post-Op   Subjective/Chief Complaint: No complaints this morning   Objective: Vital signs in last 24 hours: Temp:  [98.4 F (36.9 C)-99.1 F (37.3 C)] 99.1 F (37.3 C) (11/28 0510) Pulse Rate:  [70-75] 70 (11/28 0510) Resp:  [18] 18 (11/28 0510) BP: (123-130)/(73-82) 126/73 (11/28 0510) SpO2:  [98 %-100 %] 100 % (11/28 0510)    Intake/Output from previous day: 11/27 0701 - 11/28 0700 In: 1808.6 [P.O.:480; NG/GT:1328.6] Out: 3135 [Urine:2500; Drains:635] Intake/Output this shift: Total I/O In: -  Out: 55 [Drains:55]  Exam: Awake and alert Abdomen soft, drains with thin, clear serous fluid without bile Incisions clean  Lab Results:  Recent Labs    08/23/24 0145 08/24/24 0305  WBC 12.0* 10.2  HGB 7.7* 7.5*  HCT 24.0* 23.2*  PLT 283 270   BMET Recent Labs    08/23/24 0145 08/24/24 0305  NA 140 140  K 3.6 3.5  CL 111 111  CO2 25 23  GLUCOSE 80 80  BUN 18 12  CREATININE 0.49 0.45  CALCIUM  8.4* 8.4*   PT/INR No results for input(s): LABPROT, INR in the last 72 hours. ABG No results for input(s): PHART, HCO3 in the last 72 hours.  Invalid input(s): PCO2, PO2  Studies/Results: No results found.  Anti-infectives: Anti-infectives (From admission, onward)    Start     Dose/Rate Route Frequency Ordered Stop   08/21/24 1700  metroNIDAZOLE  (FLAGYL ) IVPB 500 mg  Status:  Discontinued        500 mg 100 mL/hr over 60 Minutes Intravenous Every 12 hours 08/21/24 0740 08/21/24 0741   08/21/24 1700  metroNIDAZOLE  (FLAGYL ) IVPB 500 mg        500 mg 100 mL/hr over 60 Minutes Intravenous Every 12 hours 08/21/24 0741     08/21/24 1400  ceFEPIme  (MAXIPIME ) 2 g in sodium chloride  0.9 % 100 mL IVPB  Status:  Discontinued        2 g 200 mL/hr over 30 Minutes Intravenous Every 8 hours 08/21/24 0740 08/21/24 0741   08/21/24 1400  ceFEPIme  (MAXIPIME ) 2 g in sodium chloride  0.9 % 100 mL IVPB        2 g 200 mL/hr over 30 Minutes Intravenous Every 8  hours 08/21/24 0741 08/26/24 1359   08/21/24 0345  metroNIDAZOLE  (FLAGYL ) IVPB 500 mg        500 mg 100 mL/hr over 60 Minutes Intravenous  Once 08/21/24 0334 08/21/24 0533   08/21/24 0345  ceFEPIme  (MAXIPIME ) 2 g in sodium chloride  0.9 % 100 mL IVPB        2 g 200 mL/hr over 30 Minutes Intravenous  Once 08/21/24 0334 08/21/24 0429       Assessment/Plan: POD4: S/P LAPAROSCOPY, DIAGNOSTIC, LAPAROSCOPIC GRAM PATCH REPAIR OF PERFORATED MARGINAL ULCER by Dr. Tanda on 08/21/2024   -upper GI ordered for today to evaluate for leak or obstruction at the site of the perforated ulcer -continue NPO until results known -continue drains -continue tube feeds  [name] 08/25/2024

## 2024-08-25 NOTE — Evaluation (Signed)
 Occupational Therapy Evaluation Patient Details Name: [name] MRN: [id number] DOB: 1976/06/07 Today's Date: 08/25/2024   History of Present Illness   [name] is a 48 yo female s/p chronic marginal ulcer with perforation s/p Arlyss omental repair 08/21/2024. 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  Currently, patient presents with deficits outlined below (see OT Problem List for details) most significantly pain, decreased activity tolerance, balance and skin integrity deficits limiting BADL's and functional mobility performance. Recommending HHOT services with family assist/support upon discharge from hospital. Patient requires continued Acute care hospital level OT services to progress safety and functional performance and allow for discharge.       If plan is discharge home, recommend the following:   A little help with walking and/or transfers;A little help with bathing/dressing/bathroom;Assistance with feeding;Assist for transportation;Help with stairs or ramp for entrance     Functional Status Assessment   Patient has had a recent decline in their functional status and demonstrates the ability to make significant improvements in function in a reasonable and predictable amount of time.     Equipment Recommendations   None recommended by OT      Precautions/Restrictions   Precautions Precautions: Fall Precaution/Restrictions Comments: abdominal, PICC, feeding tube, 2 JP drains R Restrictions Weight Bearing Restrictions Per Provider Order: No     Mobility Bed Mobility Overal bed mobility: Needs Assistance Bed Mobility: Supine to Sit     Supine to  sit: Contact guard, HOB elevated     General bed mobility comments: cues for abdominal precuations, HOB elevated due to tube feeding at time of eval    Transfers Overall transfer level: Needs assistance Equipment used: Rolling walker (2 wheels) Transfers: Sit to/from Stand Sit to Stand: Contact guard assist           General transfer comment: min cues      Balance Overall balance assessment: Needs assistance, History of Falls Sitting-balance support: Feet supported Sitting balance-Leahy Scale: Good     Standing balance support: Bilateral upper extremity supported, During functional activity, Reliant on assistive device for balance Standing balance-Leahy Scale: Poor                             ADL either performed or assessed with clinical judgement   ADL Overall ADL's : Needs assistance/impaired Eating/Feeding: Set up (bariatric liquid diet and g tube)   Grooming: Wash/dry hands;Wash/dry face;Set up;Sitting   Upper Body Bathing: Contact guard assist;Sitting   Lower Body Bathing: Minimal assistance;Sit to/from stand   Upper Body Dressing : Contact guard assist;Sitting   Lower Body Dressing: Minimal assistance;Sit to/from stand   Toilet Transfer: Contact guard assist;Rolling walker (2 wheels)   Toileting- Clothing Manipulation and Hygiene: Contact guard assist;Sit to/from stand       Functional mobility during ADLs: Contact guard assist;Rolling walker (2 wheels) General ADL Comments: increased time and rests needed     Vision Baseline Vision/History: 0 No visual deficits;1 Wears glasses              Pertinent Vitals/Pain Pain Assessment Pain Assessment: Faces Faces Pain Scale: Hurts even more Pain Location: abdomen, R knee L shoulder and LBP Pain Descriptors /  Indicators: Aching Pain Intervention(s): Monitored during session, Premedicated before session, Repositioned     Extremity/Trunk Assessment Upper Extremity Assessment Upper  Extremity Assessment: Overall WFL for tasks assessed;Right hand dominant   Lower Extremity Assessment Lower Extremity Assessment: Defer to PT evaluation   Cervical / Trunk Assessment Cervical / Trunk Assessment: Normal   Communication Communication Communication: No apparent difficulties   Cognition Arousal: Alert Behavior During Therapy: WFL for tasks assessed/performed Cognition: No apparent impairments                               Following commands: Intact       Cueing  General Comments   Cueing Techniques: Verbal cues  R lateral drain leaking, nursing aware, limited activity tolerance           Home Living Family/patient expects to be discharged to:: Private residence Living Arrangements: Children Available Help at Discharge: Family Type of Home: Apartment Home Access: Stairs to enter [employer] of Steps: 1 at the door and one to access apartment complex (2 non sequential steps) Entrance Stairs-Rails: None Home Layout: One level     Bathroom Shower/Tub: Tub/shower unit;Curtain   Firefighter: Standard Bathroom Accessibility: Yes   Home Equipment: Cane - single point;Rolling Walker (2 wheels);Rollator (4 wheels);Shower seat   Additional Comments: pt would like a rollator; 18yo and 65 yo sons live with her, 50 yo in [place]      Prior Functioning/Environment Prior Level of Function : History of Falls (last six months);Needs assist       Physical Assist : Mobility (physical);ADLs (physical) Mobility (physical): Stairs;Gait ADLs (physical): Bathing;IADLs;Dressing;Grooming Mobility Comments: recent fall last week, uses rollator and cane depending on how she feel ADLs Comments: kids assist with all cooking and cleaning.  Kids assist helping  into shower and sometimes help with bathing and dressing. Pt sometimes drives    OT Problem List: Decreased activity tolerance;Impaired balance (sitting and/or standing);Pain   OT  Treatment/Interventions: Self-care/ADL training;Therapeutic exercise;Neuromuscular education;Energy conservation;DME and/or AE instruction;Therapeutic activities;Patient/family education;Balance training      OT Goals(Current goals can be found in the care plan section)   Acute Rehab OT Goals OT Goal Formulation: With patient Time For Goal Achievement: 09/08/24 Potential to Achieve Goals: Good ADL Goals Pt Will Perform Lower Body Bathing: with supervision;sit to/from stand Pt Will Perform Lower Body Dressing: with supervision;sit to/from stand Pt Will Transfer to Toilet: with supervision;ambulating Pt Will Perform Toileting - Clothing Manipulation and hygiene: with supervision;sit to/from stand Pt Will Perform Tub/Shower Transfer: with contact guard assist;shower seat;ambulating   OT Frequency:  Min 2X/week    Co-evaluation   Reason for Co-Treatment: Complexity of the patient's impairments (multi-system involvement);To address functional/ADL transfers;For patient/therapist safety;Necessary to address cognition/behavior during functional activity PT goals addressed during session: Mobility/safety with mobility;Balance;Proper use of DME OT goals addressed during session: ADL's and self-care;Proper use of Adaptive equipment and DME      AM-PAC OT 6 Clicks Daily Activity     Outcome Measure Help from another person eating meals?: A Little Help from another person taking care of personal grooming?: A Little Help from another person toileting, which includes using toliet, bedpan, or urinal?: A Little Help from another person bathing (including washing, rinsing, drying)?: A Little Help from another person to put on and taking off regular upper body clothing?: A Little Help from another person to put on and taking off regular lower body clothing?: A Little 6 Click  Score: 18   End of Session Equipment Utilized During Treatment: Gait belt;Rolling walker (2 wheels) Nurse Communication:  Mobility status;Other (comment) (dressing changed needed)  Activity Tolerance: Patient limited by fatigue Patient left: in chair;with call bell/phone within reach;with chair alarm set  OT Visit Diagnosis: Unsteadiness on feet (R26.81);Repeated falls (R29.6);Pain Pain - part of body:  (abdominal)                Time: 8559-8472 OT Time Calculation (min): 47 min Charges:  OT General Charges $OT Visit: 1 Visit OT Evaluation $OT Eval Low Complexity: 1 Low OT Treatments $Self Care/Home Management : 8-22 mins  Lilac Hoff OT/L Acute Rehabilitation Department  [phone]  08/25/2024, 5:12 PM

## 2024-08-25 NOTE — Progress Notes (Signed)
 Upper GI shows no leak or obstruction.  Tolerating tube feeds at goal.  Start bariatric clear liquid diet.  Can advance to bariatric full liquid diet as tolerated.  See if we can start gradually cycling the tube feeds at night which she says is her usual regimen.

## 2024-08-26 LAB — GLUCOSE, CAPILLARY
Glucose-Capillary: 122 mg/dL — ABNORMAL HIGH (ref 70–99)
Glucose-Capillary: 147 mg/dL — ABNORMAL HIGH (ref 70–99)
Glucose-Capillary: 69 mg/dL — ABNORMAL LOW (ref 70–99)
Glucose-Capillary: 93 mg/dL (ref 70–99)
Glucose-Capillary: 94 mg/dL (ref 70–99)
Glucose-Capillary: 99 mg/dL (ref 70–99)

## 2024-08-26 LAB — CBC
HCT: 26.9 % — ABNORMAL LOW (ref 36.0–46.0)
Hemoglobin: 8.7 g/dL — ABNORMAL LOW (ref 12.0–15.0)
MCH: 30.4 pg (ref 26.0–34.0)
MCHC: 32.3 g/dL (ref 30.0–36.0)
MCV: 94.1 fL (ref 80.0–100.0)
Platelets: 342 K/uL (ref 150–400)
RBC: 2.86 MIL/uL — ABNORMAL LOW (ref 3.87–5.11)
RDW: 14.4 % (ref 11.5–15.5)
WBC: 9 K/uL (ref 4.0–10.5)
nRBC: 0 % (ref 0.0–0.2)

## 2024-08-26 LAB — NICOTINE/COTININE METABOLITES
Cotinine: <1 ng/mL
Nicotine: <1 ng/mL

## 2024-08-26 LAB — COPPER, SERUM: Copper: 114 ug/dL (ref 80–158)

## 2024-08-26 LAB — MAGNESIUM: Magnesium: 2.1 mg/dL (ref 1.7–2.4)

## 2024-08-26 LAB — ZINC: Zinc: 53 ug/dL (ref 44–115)

## 2024-08-26 LAB — PHOSPHORUS: Phosphorus: 3.4 mg/dL (ref 2.5–4.6)

## 2024-08-26 MED ORDER — FOLIC ACID 5 MG/ML IJ SOLN: 1.0000 mg | Freq: Every day | INTRAMUSCULAR | Status: DC

## 2024-08-26 MED ORDER — LACTATED RINGERS IV BOLUS: 1000.0000 mL | Freq: Three times a day (TID) | INTRAVENOUS | Status: AC | PRN

## 2024-08-26 MED ORDER — FOLIC ACID 1 MG PO TABS
1.0000 mg | ORAL_TABLET | Freq: Every day | ORAL | Status: DC
  Administered 2024-08-27 – 2024-08-30 (×4): 1 mg
  Filled 2024-08-26 (×4): qty 1

## 2024-08-26 MED ORDER — FOLIC ACID 1 MG PO TABS: 1.0000 mg | ORAL_TABLET | Freq: Every day | ORAL | Status: DC

## 2024-08-26 MED ORDER — PANTOPRAZOLE SODIUM 40 MG PO TBEC
40.0000 mg | DELAYED_RELEASE_TABLET | Freq: Two times a day (BID) | ORAL | Status: DC
  Administered 2024-08-26 – 2024-08-30 (×9): 40 mg via ORAL
  Filled 2024-08-26 (×9): qty 1

## 2024-08-26 NOTE — Progress Notes (Signed)
 08/26/2024  [name] [id number] 1975/10/15  CARE TEAM: PCP: Orion Kerns, MD  Outpatient Care Team: Patient Care Team: Orion Kerns, MD as PCP - General (General Practice) Anner Alm ORN, MD as PCP - Cardiology (Cardiology) Ernie Cough, MD as Consulting Physician (Orthopedic Surgery) Avram Lupita BRAVO, MD as Consulting Physician (Gastroenterology) Ernie Cough, MD as Consulting Physician (Orthopedic Surgery) Scotece, Thersia NOVAK, RD as Dietitian (Dietician) Fredirick Glenys RAMAN, MD as Consulting Physician (Obstetrics and Gynecology) Stechschulte, Deward PARAS, MD as Consulting Physician (Surgery) Saintclair Jasper, MD as Consulting Physician (Gastroenterology)  Inpatient Treatment Team: Treatment Team:  Dubach, Md, MD Ccs, Md, MD Mahat, [place], RN Arlyss Si DASEN, [place] Duwayne Angeline NOVAK, RN   Problem List:   Principal Problem:   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 Active Problems:   Chronic headache disorder   Protein-calorie malnutrition, moderate   Chronic constipation   MDD (major depressive disorder), recurrent severe, without psychosis (HCC)   Gait instability   Acquired spondylolysis   Chronic low back pain   History of urinary urgency   Folate deficiency anemia   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   08/21/2024  PRE-OPERATIVE DIAGNOSIS:   PERFORATED MARGINAL ULCER 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 Tanda on 08/21/2024 -Afebrile.  -WBC from 26.3 to normal (10.2) -Upper GI POD#4 11/29 confirms no evidence of any leak or obstruction - Tolerating.  Clears.  Advance to bariatric fulls this morning.  Can try and advance to bariatric solid diet.  Sounds like patient prefers to do low volume fulls at home.  - Tolerating tube feeds through gastric remnant at goal.  Nutrition team working to cycle back down.  Usually does 60 mL of 2.0 at night. -CT scan on postop day 5 if not better or markedly worsened.  Hopefully not too likely at this point - Surgical Blake drains near repair volume going down remains serous.  I wrote order to check amylase.  If amylase normal and tolerating p.o. well, most likely can remove drains later this admission/at the time of discharge.   -HGB low with iron  deficiency anemia and folate deficiency.  Enteral folate.  Given IV iron  11/27.  Follow -Add bowel regimen.  Will do liquid lactulose  for now to help discourage any further constipation.  Seems like she is often on Linzess  as well.  I want to hold off on that for now but may need to reconsider.    Multimodal pain control.  Patient does have allergies to some narcotics and has a strong preference for her home regimen.  Will try and gradually restart that through her G-tube.  Sensing there is resistance to this.  See if we can wean her off IV narcotics since she refuses to go less than 2 hours without getting an IV shot.  FEN: NPO; advancing tube feeds to goal.  DC IV fluids with PRN (as needed) backup.  Bowel regimen VTE: Heparin  every 8 hours subcu prophylaxis  ID: Cefepime /metronidazole   x 5d postop = finish 11/29  Try and get her up and mobilize more.  Physical therapy working.  Benefit from rolling walker at home assistance and physical therapy can make sure it is safe.  I updated the patient's status to the patient  Recommendations were made.  Questions were answered.  She expressed  understanding & appreciation.  -Disposition:  Disposition:  The patient is from: Home Anticipate discharge to:  Home with Home Health Anticipated Date of Discharge is: December 2 ,2025   Barriers to discharge:  Pending Clinical improvement (more likely than not), Therapy assessment & Recommendations pending, Need for inpatient procedure/study, Testing result pending, Consultant clearance & sign off  , Transitions of Care, and Social/Financial Barriers  Patient currently is NOT MEDICALLY STABLE for discharge from the hospital from a surgery standpoint.      I reviewed nursing notes, last 24 h vitals and pain scores, last 48 h intake and output, last 24 h labs and trends, and last 24 h imaging results.  I have reviewed this patient's available data, including medical history, events of note, test results, etc as part of my evaluation.   A significant portion of that time was spent in counseling. Care during the described time interval was provided by me.  This care required moderate level of medical decision making.  08/26/2024    Subjective: (Chief complaint)  Upper GI done yesterday.  Tolerated.  Tolerating.  Clears.  Tube feeds at goal.  Still with soreness and concerns on abdomen but does admit she is feeling better.  Objective:  Vital signs:  Vitals:   08/25/24 1341 08/25/24 1951 08/26/24 0558 08/26/24 0612  BP: 127/87 107/72 119/77   Pulse: 79 80 99   Resp: 18 17 16    Temp: 99.1 F (37.3 C) 98.6 F (37 C) 99.9 F (37.7 C)   TempSrc: Oral Oral Oral   SpO2: 100% 100% 100%   Weight:    64.9 kg  Height:        Last BM Date :  (pt states she doesnt know when the last bm was.)  Intake/Output   Yesterday:  11/28 0701 - 11/29 0700 In: 3158.3 [P.O.:480; NG/GT:1386.7; IV Piggyback:1291.6] Out: 1435 [Urine:1200; Drains:235] This shift:  No intake/output data recorded.  Bowel function:  Flatus: YES  BM:  No  Drain: Serous   Physical Exam:  General: Pt  awake/alert in no acute distress Eyes: PERRL, normal EOM.  Sclera clear.  No icterus Neuro: CN II-XII intact w/o focal sensory/motor deficits. Lymph: No head/neck/groin lymphadenopathy Psych:  No delerium/psychosis/paranoia.  Oriented x 4 HENT: Normocephalic, Mucus membranes moist.  No thrush Neck: Supple, No tracheal deviation.  No obvious thyromegaly Chest: No pain to chest wall compression.  Good respiratory excursion.  No audible wheezing CV:  Pulses intact.  Regular rhythm.  No major extremity edema MS: Normal AROM mjr joints.  No obvious deformity  Abdomen: Soft.  Nondistended.  Mildly tender at incisions only.  Mainly at left upper quadrant G-tube and right sided surgical Blake drains only no evidence of peritonitis.  No incarcerated hernias. Gastrostomy tube in place without any obvious leak or irritation  Ext:  No deformity.  No mjr edema.  No cyanosis Skin: No petechiae / purpurea.  No major sores.  Warm and dry    Results:   Cultures: Recent Results (from the past 720 hours)  Respiratory (~20 pathogens) panel by PCR     Status: None   Collection Time: 08/13/24 12:31 PM  Specimen: Nasopharyngeal Swab; Respiratory  Result Value Ref Range Status   Adenovirus NOT DETECTED NOT DETECTED Final   Coronavirus 229E NOT DETECTED NOT DETECTED Final    Comment: (NOTE) The Coronavirus on the Respiratory Panel, DOES NOT test for the novel  Coronavirus (2019 nCoV)    Coronavirus HKU1 NOT DETECTED NOT DETECTED Final   Coronavirus NL63 NOT DETECTED NOT DETECTED Final   Coronavirus OC43 NOT DETECTED NOT DETECTED Final   Metapneumovirus NOT DETECTED NOT DETECTED Final   Rhinovirus / Enterovirus NOT DETECTED NOT DETECTED Final   Influenza A NOT DETECTED NOT DETECTED Final   Influenza B NOT DETECTED NOT DETECTED Final   Parainfluenza Virus 1 NOT DETECTED NOT DETECTED Final   Parainfluenza Virus 2 NOT DETECTED NOT DETECTED Final   Parainfluenza Virus 3 NOT DETECTED NOT DETECTED Final    Parainfluenza Virus 4 NOT DETECTED NOT DETECTED Final   Respiratory Syncytial Virus NOT DETECTED NOT DETECTED Final   Bordetella pertussis NOT DETECTED NOT DETECTED Final   Bordetella Parapertussis NOT DETECTED NOT DETECTED Final   Chlamydophila pneumoniae NOT DETECTED NOT DETECTED Final   Mycoplasma pneumoniae NOT DETECTED NOT DETECTED Final    Comment: Performed at [facility], [address], [place], [place] [address]  Resp panel by RT-PCR (RSV, Flu A&B, Covid) Anterior Nasal Swab     Status: None   Collection Time: 08/13/24 12:31 PM   Specimen: Anterior Nasal Swab  Result Value Ref Range Status   SARS Coronavirus 2 by RT PCR NEGATIVE NEGATIVE Final    Comment: (NOTE) SARS-CoV-2 target nucleic acids are NOT DETECTED.  The SARS-CoV-2 RNA is generally detectable in upper respiratory specimens during the acute phase of infection. The lowest concentration of SARS-CoV-2 viral copies this assay can detect is 138 copies/mL. A negative result does not preclude SARS-Cov-2 infection and should not be used as the sole basis for treatment or other patient management decisions. A negative result may occur with  improper specimen collection/handling, submission of specimen other than nasopharyngeal swab, presence of viral mutation(s) within the areas targeted by this assay, and inadequate number of viral copies(<138 copies/mL). A negative result must be combined with clinical observations, patient history, and epidemiological information. The expected result is Negative.  Fact Sheet for Patients:  bloggercourse.com  Fact Sheet for Healthcare Providers:  seriousbroker.it  This test is no t yet approved or cleared by the [place]  FDA and  has been authorized for detection and/or diagnosis of SARS-CoV-2 by FDA under an Emergency Use Authorization (EUA). This EUA will remain  in effect (meaning this test can be used) for the  duration of the COVID-19 declaration under Section 564(b)(1) of the Act, 21 U.S.C.section 360bbb-3(b)(1), unless the authorization is terminated  or revoked sooner.       Influenza A by PCR NEGATIVE NEGATIVE Final   Influenza B by PCR NEGATIVE NEGATIVE Final    Comment: (NOTE) The Xpert Xpress SARS-CoV-2/FLU/RSV plus assay is intended as an aid in the diagnosis of influenza from Nasopharyngeal swab specimens and should not be used as a sole basis for treatment. Nasal washings and aspirates are unacceptable for Xpert Xpress SARS-CoV-2/FLU/RSV testing.  Fact Sheet for Patients: bloggercourse.com  Fact Sheet for Healthcare Providers: seriousbroker.it  This test is not yet approved or cleared by the [place]  FDA and has been authorized for detection and/or diagnosis of SARS-CoV-2 by FDA under an Emergency Use Authorization (EUA). This EUA will remain in effect (meaning this test can be used) for  the duration of the COVID-19 declaration under Section 564(b)(1) of the Act, 21 U.S.C. section 360bbb-3(b)(1), unless the authorization is terminated or revoked.     Resp Syncytial Virus by PCR NEGATIVE NEGATIVE Final    Comment: (NOTE) Fact Sheet for Patients: bloggercourse.com  Fact Sheet for Healthcare Providers: seriousbroker.it  This test is not yet approved or cleared by the [place]  FDA and has been authorized for detection and/or diagnosis of SARS-CoV-2 by FDA under an Emergency Use Authorization (EUA). This EUA will remain in effect (meaning this test can be used) for the duration of the COVID-19 declaration under Section 564(b)(1) of the Act, 21 U.S.C. section 360bbb-3(b)(1), unless the authorization is terminated or revoked.  Performed at [facility], [address] [place], [place] [address]   Culture, blood (Routine X 2) w Reflex to ID  Panel     Status: None   Collection Time: 08/14/24 10:28 AM   Specimen: BLOOD  Result Value Ref Range Status   Specimen Description   Final    BLOOD BLOOD LEFT ARM Performed at [facility], [address], [place], [place] [address]    Special Requests   Final    BOTTLES DRAWN AEROBIC AND ANAEROBIC Blood Culture adequate volume Performed at [facility], [address] [place], [place] [address]    Culture   Final    NO GROWTH 5 DAYS Performed at [facility], [address], [place], [place] [address]    Report Status 08/19/2024 FINAL  Final  Culture, blood (Routine X 2) w Reflex to ID Panel     Status: None   Collection Time: 08/14/24 10:34 AM   Specimen: BLOOD  Result Value Ref Range Status   Specimen Description   Final    BLOOD BLOOD LEFT HAND Performed at [facility], [address], [place], [place] [address]    Special Requests   Final    BOTTLES DRAWN AEROBIC ONLY Blood Culture adequate volume Performed at [facility], [address], [place], [place] [address]    Culture   Final    NO GROWTH 5 DAYS Performed at [facility], [address] [place], [place] [address]    Report Status 08/19/2024 FINAL  Final    Labs: Results for orders placed or performed during the hospital encounter of 08/21/24 (from the past 48 hours)  Glucose, capillary     Status: None   Collection Time: 08/24/24 11:59 AM  Result Value Ref Range   Glucose-Capillary 87 70 - 99 mg/dL    Comment: Glucose reference range applies only to samples taken after fasting for at least 8 hours.  Glucose, capillary     Status: None   Collection Time: 08/24/24  4:02 PM  Result Value Ref Range   Glucose-Capillary 93 70 - 99 mg/dL    Comment: Glucose reference range applies only to samples taken after fasting for at least 8 hours.  Vitamin B12     Status: Abnormal   Collection Time: 08/24/24  5:24 PM  Result  Value Ref Range   Vitamin B-12 945 (H) 180 - 914 pg/mL    Comment: Performed at [facility], [address] [place], [place] [address]  Prealbumin     Status: Abnormal   Collection Time: 08/24/24  5:24 PM  Result Value Ref Range   Prealbumin <5 (L) 18 - 38 mg/dL    Comment: Performed at [facility], [address] [place], [place] [address]  Glucose, capillary     Status: None   Collection Time: 08/24/24  7:43 PM  Result Value Ref Range   Glucose-Capillary 99 70 - 99 mg/dL    Comment: Glucose reference range applies only to samples taken after fasting for at least 8 hours.  Glucose, capillary     Status: Abnormal   Collection Time: 08/24/24 11:50 PM  Result Value Ref Range   Glucose-Capillary 100 (H) 70 - 99 mg/dL    Comment: Glucose reference range applies only to samples taken after fasting for at least 8 hours.  Glucose, capillary     Status: None   Collection Time: 08/25/24  4:07 AM  Result Value Ref Range   Glucose-Capillary 95 70 - 99 mg/dL    Comment: Glucose reference range applies only to samples taken after fasting for at least 8 hours.  Magnesium      Status: None   Collection Time: 08/25/24  4:23 AM  Result Value Ref Range   Magnesium  1.9 1.7 - 2.4 mg/dL    Comment: Performed at [facility], [address] [place], [place] [address]  Phosphorus     Status: Abnormal   Collection Time: 08/25/24  4:23 AM  Result Value Ref Range   Phosphorus 2.4 (L) 2.5 - 4.6 mg/dL    Comment: Performed at [facility], [address], [place], [place] [address]  Glucose, capillary     Status: Abnormal   Collection Time: 08/25/24  7:54 AM  Result Value Ref Range   Glucose-Capillary 105 (H) 70 - 99 mg/dL    Comment: Glucose reference range applies only to samples taken after fasting for at least 8 hours.  Glucose, capillary     Status: Abnormal   Collection Time: 08/25/24 11:50 AM  Result Value Ref Range    Glucose-Capillary 100 (H) 70 - 99 mg/dL    Comment: Glucose reference range applies only to samples taken after fasting for at least 8 hours.  Glucose, capillary     Status: None   Collection Time: 08/25/24  5:21 PM  Result Value Ref Range   Glucose-Capillary 90 70 - 99 mg/dL    Comment: Glucose reference range applies only to samples taken after fasting for at least 8 hours.  Glucose, capillary     Status: Abnormal   Collection Time: 08/25/24  7:52 PM  Result Value Ref Range   Glucose-Capillary 126 (H) 70 - 99 mg/dL    Comment: Glucose reference range applies only to samples taken after fasting for at least 8 hours.  Glucose, capillary     Status: Abnormal   Collection Time: 08/25/24 11:51 PM  Result Value Ref Range   Glucose-Capillary 138 (H) 70 - 99 mg/dL    Comment: Glucose reference range applies only to samples taken after fasting for at least 8 hours.  Glucose, capillary     Status: Abnormal   Collection Time: 08/26/24  3:53 AM  Result Value Ref Range   Glucose-Capillary 122 (H) 70 - 99 mg/dL    Comment: Glucose reference range applies only to samples taken after fasting for at least 8 hours.  Magnesium      Status: None   Collection Time: 08/26/24  4:09 AM  Result Value Ref Range   Magnesium  2.1 1.7 - 2.4 mg/dL    Comment: Performed at [facility], [address] [place], [place] [address]  Phosphorus  Status: None   Collection Time: 08/26/24  4:09 AM  Result Value Ref Range   Phosphorus 3.4 2.5 - 4.6 mg/dL    Comment: Performed at [facility], [address], [place], [place] [address]  CBC     Status: Abnormal   Collection Time: 08/26/24  4:09 AM  Result Value Ref Range   WBC 9.0 4.0 - 10.5 K/uL   RBC 2.86 (L) 3.87 - 5.11 MIL/uL   Hemoglobin 8.7 (L) 12.0 - 15.0 g/dL   HCT 73.0 (L) 63.9 - 53.9 %   MCV 94.1 80.0 - 100.0 fL   MCH 30.4 26.0 - 34.0 pg   MCHC 32.3 30.0 - 36.0 g/dL   RDW 85.5 88.4 - 84.4 %   Platelets 342 150 -  400 K/uL   nRBC 0.0 0.0 - 0.2 %    Comment: Performed at [facility], [address] [place], [place] [address]  Glucose, capillary     Status: Abnormal   Collection Time: 08/26/24  7:55 AM  Result Value Ref Range   Glucose-Capillary 147 (H) 70 - 99 mg/dL    Comment: Glucose reference range applies only to samples taken after fasting for at least 8 hours.    Imaging / Studies: DG UGI W SINGLE CM (SOL OR THIN BA) Result Date: 08/25/2024 EXAM: Fluoroscopic Upper GI Series 08/25/2024 10:23:01 AM CLINICAL HISTORY: Paragastric bypass with perforation in the vicinity of the gastrojejunal anastomosis repaired with Arlyss patch, assessment for leak. COMPARISON: CT abdomen 08/21/2024. TECHNIQUE: 60 cc Omnipaque  300 was administered orally to the patient with careful observation of the vicinity of prior perforation. Today's exam was focused on the site of surgical repair. A single contrast Upper GI Series was performed under fluoroscopy. FLUOROSCOPY DOSE AND TYPE: Radiation Exposure Index: Reference Air Kerma (in mGy) = 18.5 FINDINGS: Preliminary image: The patient swallowed contrast without difficulty. Initial KUB demonstrates a left sacral nerve stimulator as well as a gastric tube known to be in the bypassed portion of the stomach. Presumed atelectasis at the left lung base. Pharyngeal phase of swallowing not assessed. Esophageal motility and morphology not assessed. Focused evaluation of the gastric pouch and gastrojejunal anastomosis was performed and reveals no leak in the vicinity of the [facility]. The patient was turned into different positions to depict the region from different angles. IMPRESSION: 1. No evidence of leak at the [place] patch site. 2. Presumed left basilar atelectasis. Electronically signed by: Ryan Salvage MD 08/25/2024 12:13 PM EST RP Workstation: [id number]    Medications / Allergies: per chart  Antibiotics: Anti-infectives (From admission,  onward)    Start     Dose/Rate Route Frequency Ordered Stop   08/21/24 1700  metroNIDAZOLE  (FLAGYL ) IVPB 500 mg  Status:  Discontinued        500 mg 100 mL/hr over 60 Minutes Intravenous Every 12 hours 08/21/24 0740 08/21/24 0741   08/21/24 1700  metroNIDAZOLE  (FLAGYL ) IVPB 500 mg        500 mg 100 mL/hr over 60 Minutes Intravenous Every 12 hours 08/21/24 0741     08/21/24 1400  ceFEPIme  (MAXIPIME ) 2 g in sodium chloride  0.9 % 100 mL IVPB  Status:  Discontinued        2 g 200 mL/hr over 30 Minutes Intravenous Every 8 hours 08/21/24 0740 08/21/24 0741   08/21/24 1400  ceFEPIme  (MAXIPIME ) 2 g in sodium chloride  0.9 % 100 mL IVPB        2 g 200  mL/hr over 30 Minutes Intravenous Every 8 hours 08/21/24 0741 08/26/24 0644   08/21/24 0345  metroNIDAZOLE  (FLAGYL ) IVPB 500 mg        500 mg 100 mL/hr over 60 Minutes Intravenous  Once 08/21/24 0334 08/21/24 0533   08/21/24 0345  ceFEPIme  (MAXIPIME ) 2 g in sodium chloride  0.9 % 100 mL IVPB        2 g 200 mL/hr over 30 Minutes Intravenous  Once 08/21/24 9665 08/21/24 0429         Note: Portions of this report may have been transcribed using voice recognition software. Every effort was made to ensure accuracy; however, inadvertent computerized transcription errors may be present.   Any transcriptional errors that result from this process are unintentional.    Elspeth KYM Schultze, MD, FACS, MASCRS Esophageal, Gastrointestinal & Colorectal Surgery Robotic and Minimally Invasive Surgery  [facility] [address] [place], [place] [address] [phone] Fax [phone] Main  CONTACT INFORMATION: Weekday (9AM-5PM): Call [facility] main office at [phone] Weeknight (5PM-9AM) or Weekend/Holiday: Check EPIC Web Links tab & use AMION (password  [email or web address]) for General Surgery [facility] coverage  Please, DO NOT use SecureChat  (it is not reliable communication to reach operating surgeons &  will lead to a delay in care).   Epic staff messaging available for outpatient concerns needing 1-2 business day response.      08/26/2024  8:07 AM

## 2024-08-27 LAB — GLUCOSE, CAPILLARY
Glucose-Capillary: 162 mg/dL — ABNORMAL HIGH (ref 70–99)
Glucose-Capillary: 129 mg/dL — ABNORMAL HIGH (ref 70–99)
Glucose-Capillary: 117 mg/dL — ABNORMAL HIGH (ref 70–99)
Glucose-Capillary: 84 mg/dL (ref 70–99)
Glucose-Capillary: 89 mg/dL (ref 70–99)

## 2024-08-27 LAB — ZINC: Zinc: 24 ug/dL — ABNORMAL LOW (ref 44–115)

## 2024-08-27 NOTE — Progress Notes (Signed)
 Mobility Specialist - Progress Note   08/27/24 1304  Mobility  Activity Ambulated with assistance  Level of Assistance Standby assist, set-up cues, supervision of patient - no hands on  Assistive Device Front wheel walker  Distance Ambulated (ft) 190 ft  Range of Motion/Exercises Active  Activity Response Tolerated fair  Mobility visit 1 Mobility  Mobility Specialist Start Time (ACUTE ONLY) 1250  Mobility Specialist Stop Time (ACUTE ONLY) 1304  Mobility Specialist Time Calculation (min) (ACUTE ONLY) 14 min   Pt was found in bed and agreeable to mobilize. C/o rib and abdominal pain. Had x1 seated rest break due to pain. At EOS returned to recliner chair with all needs met. Call bell in reach. NT in room.   Erminio Leos,  Mobility Specialist Can be reached via Secure Chat

## 2024-08-27 NOTE — Progress Notes (Signed)
 6 Days Post-Op   Subjective/Chief Complaint: Tolerating tube feeds Reports tolerating po but no hungry   Objective: Vital signs in last 24 hours: Temp:  [98.2 F (36.8 C)-98.6 F (37 C)] 98.2 F (36.8 C) (11/30 0505) Pulse Rate:  [78-89] 86 (11/30 0505) Resp:  [14-16] 15 (11/30 0505) BP: (97-115)/(62-80) 97/62 (11/30 0505) SpO2:  [98 %-100 %] 98 % (11/30 0505) Weight:  [63.4 kg] 63.4 kg (11/30 0505) Last BM Date : 08/26/24  Intake/Output from previous day: 11/29 0701 - 11/30 0700 In: 420 [P.O.:420] Out: 725 [Urine:600; Drains:125] Intake/Output this shift: No intake/output data recorded.  Exam: Awake and alert Abdomen soft, drains clear, incision clean  Lab Results:  Recent Labs    08/26/24 0409  WBC 9.0  HGB 8.7*  HCT 26.9*  PLT 342   BMET No results for input(s): NA, K, CL, CO2, GLUCOSE, BUN, CREATININE, CALCIUM  in the last 72 hours. PT/INR No results for input(s): LABPROT, INR in the last 72 hours. ABG No results for input(s): PHART, HCO3 in the last 72 hours.  Invalid input(s): PCO2, PO2  Studies/Results: DG UGI W SINGLE CM (SOL OR THIN BA) Result Date: 08/25/2024 EXAM: Fluoroscopic Upper GI Series 08/25/2024 10:23:01 AM CLINICAL HISTORY: Paragastric bypass with perforation in the vicinity of the gastrojejunal anastomosis repaired with Arlyss patch, assessment for leak. COMPARISON: CT abdomen 08/21/2024. TECHNIQUE: 60 cc Omnipaque  300 was administered orally to the patient with careful observation of the vicinity of prior perforation. Today's exam was focused on the site of surgical repair. A single contrast Upper GI Series was performed under fluoroscopy. FLUOROSCOPY DOSE AND TYPE: Radiation Exposure Index: Reference Air Kerma (in mGy) = 18.5 FINDINGS: Preliminary image: The patient swallowed contrast without difficulty. Initial KUB demonstrates a left sacral nerve stimulator as well as a gastric tube known to be in the bypassed  portion of the stomach. Presumed atelectasis at the left lung base. Pharyngeal phase of swallowing not assessed. Esophageal motility and morphology not assessed. Focused evaluation of the gastric pouch and gastrojejunal anastomosis was performed and reveals no leak in the vicinity of the [facility]. The patient was turned into different positions to depict the region from different angles. IMPRESSION: 1. No evidence of leak at the [place] patch site. 2. Presumed left basilar atelectasis. Electronically signed by: Ryan Salvage MD 08/25/2024 12:13 PM EST RP Workstation: [id number]    Anti-infectives: Anti-infectives (From admission, onward)    Start     Dose/Rate Route Frequency Ordered Stop   08/21/24 1700  metroNIDAZOLE  (FLAGYL ) IVPB 500 mg  Status:  Discontinued        500 mg 100 mL/hr over 60 Minutes Intravenous Every 12 hours 08/21/24 0740 08/21/24 0741   08/21/24 1700  metroNIDAZOLE  (FLAGYL ) IVPB 500 mg  Status:  Discontinued        500 mg 100 mL/hr over 60 Minutes Intravenous Every 12 hours 08/21/24 0741 08/26/24 0848   08/21/24 1400  ceFEPIme  (MAXIPIME ) 2 g in sodium chloride  0.9 % 100 mL IVPB  Status:  Discontinued        2 g 200 mL/hr over 30 Minutes Intravenous Every 8 hours 08/21/24 0740 08/21/24 0741   08/21/24 1400  ceFEPIme  (MAXIPIME ) 2 g in sodium chloride  0.9 % 100 mL IVPB        2 g 200 mL/hr over 30 Minutes Intravenous Every 8 hours 08/21/24 0741 08/26/24 0644   08/21/24 0345  metroNIDAZOLE  (FLAGYL ) IVPB 500 mg        500 mg  100 mL/hr over 60 Minutes Intravenous  Once 08/21/24 0334 08/21/24 0533   08/21/24 0345  ceFEPIme  (MAXIPIME ) 2 g in sodium chloride  0.9 % 100 mL IVPB        2 g 200 mL/hr over 30 Minutes Intravenous  Once 08/21/24 0334 08/21/24 0429       Assessment/Plan: POD 6: S/P LAPAROSCOPY, DIAGNOSTIC, LAPAROSCOPIC GRAM PATCH REPAIR OF PERFORATED MARGINAL ULCER by Dr. Tanda on 08/21/2024   -continue tube feeds -hopefully can get drains out  soon -continue bariatric full liquid diet  Vicenta Poli 08/27/2024

## 2024-08-27 NOTE — Progress Notes (Signed)
 Occupational Therapy Treatment Patient Details Name: [name] MRN: [id number] DOB: 10-22-1975 Today's Date: 08/27/2024   History of present illness [name] is a 48 yo female s/p chronic marginal ulcer with perforation s/p Arlyss omental repair 08/21/2024. 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 Communicated with RN when doffing damp gown pts tubes clipped to gown. Pt noticed quickly it was pulling and unclipped tubes. RN aware      If plan is discharge home, recommend the following:  A little help with walking and/or transfers;A little help with bathing/dressing/bathroom;Assistance with feeding;Assist for transportation;Help with stairs or ramp for entrance   Equipment Recommendations  None recommended by OT    Recommendations for Other Services      Precautions / Restrictions Precautions Precautions: Fall Precaution/Restrictions Comments: abdominal, PICC, feeding tube, 2 JP drains R       Mobility Bed Mobility Overal bed mobility: Needs Assistance Bed Mobility: Supine to Sit     Supine to sit: Contact guard, HOB elevated          Transfers Overall transfer level: Needs assistance Equipment used: 1 person hand held assist Transfers: Sit to/from Stand Sit to Stand: Min assist                 Balance Overall balance assessment: Needs assistance, History of Falls Sitting-balance support: Feet supported Sitting balance-Leahy Scale: Good     Standing balance support: During functional activity, Single extremity supported Standing balance-Leahy Scale: Fair                             ADL either performed or assessed with clinical judgement   ADL Overall ADL's : Needs  assistance/impaired     Grooming: Wash/dry hands;Wash/dry face;Set up;Sitting           Upper Body Dressing : Contact guard assist Upper Body Dressing Details (indicate cue type and reason): new gown as her gown was damp Lower Body Dressing: Minimal assistance;Sit to/from stand   Toilet Transfer: Contact guard assist;Rolling walker (2 wheels) Toilet Transfer Details (indicate cue type and reason): bed to chair           General ADL Comments: pt agreed to OOB on 2nd OT attempt. Pt did complain of cramping but felt better when we got to the chair    Extremity/Trunk Assessment Upper Extremity Assessment Upper Extremity Assessment: Overall WFL for tasks assessed                     Communication Communication Communication: No apparent difficulties   Cognition Arousal: Alert Behavior During Therapy: WFL for tasks assessed/performed Cognition: No apparent impairments                               Following commands: Intact        Cueing   Cueing Techniques: Verbal cues  Exercises              Pertinent Vitals/ Pain       Pain Assessment Faces Pain Scale: Hurts little more Pain Location: cramps in abdomen area Pain Descriptors / Indicators: Cramping Pain Intervention(s): Limited activity within  patient's tolerance, Monitored during session, Repositioned         Frequency  Min 2X/week        Progress Toward Goals  OT Goals(current goals can now be found in the care plan section)     Acute Rehab OT Goals OT Goal Formulation: With patient  Plan         AM-PAC OT 6 Clicks Daily Activity     Outcome Measure   Help from another person eating meals?: A Little Help from another person taking care of personal grooming?: A Little Help from another person toileting, which includes using toliet, bedpan, or urinal?: A Little Help from another person bathing (including washing, rinsing, drying)?: A Little Help from another person to  put on and taking off regular upper body clothing?: A Little Help from another person to put on and taking off regular lower body clothing?: A Little 6 Click Score: 18    End of Session    OT Visit Diagnosis: Unsteadiness on feet (R26.81);Repeated falls (R29.6);Pain   Activity Tolerance Patient tolerated treatment well   Patient Left in chair;with call bell/phone within reach;with chair alarm set   Nurse Communication Mobility status;Other (comment)        Time: 8784-8765 OT Time Calculation (min): 19 min  Charges: OT General Charges $OT Visit: 1 Visit OT Treatments $Self Care/Home Management : 8-22 mins    Fedor Kazmierski, Norvel D 08/27/2024, 1:20 PM

## 2024-08-27 NOTE — Plan of Care (Signed)
   Problem: Education: Goal: Knowledge of General Education information will improve Description Including pain rating scale, medication(s)/side effects and non-pharmacologic comfort measures Outcome: Progressing

## 2024-08-28 LAB — GLUCOSE, CAPILLARY
Glucose-Capillary: 100 mg/dL — ABNORMAL HIGH (ref 70–99)
Glucose-Capillary: 106 mg/dL — ABNORMAL HIGH (ref 70–99)
Glucose-Capillary: 119 mg/dL — ABNORMAL HIGH (ref 70–99)
Glucose-Capillary: 140 mg/dL — ABNORMAL HIGH (ref 70–99)
Glucose-Capillary: 70 mg/dL (ref 70–99)
Glucose-Capillary: 76 mg/dL (ref 70–99)
Glucose-Capillary: 90 mg/dL (ref 70–99)

## 2024-08-28 LAB — VITAMIN B1: Vitamin B1 (Thiamine): 96.9 nmol/L (ref 66.5–200.0)

## 2024-08-28 NOTE — Progress Notes (Signed)
 7 Days Post-Op   Subjective/Chief Complaint: Doing well tol TFs +BMs   Objective: Vital signs in last 24 hours: Temp:  [97.9 F (36.6 C)-98.9 F (37.2 C)] 97.9 F (36.6 C) (12/01 0513) Pulse Rate:  [86-92] 92 (12/01 0513) Resp:  [15-17] 16 (12/01 0513) BP: (104-123)/(63-82) 104/65 (12/01 0513) SpO2:  [99 %-100 %] 99 % (12/01 0513) Last BM Date : 08/26/24  Intake/Output from previous day: 11/30 0701 - 12/01 0700 In: 1999 [P.O.:1280; NG/GT:719] Out: 1312 [Urine:1200; Drains:112] Intake/Output this shift: No intake/output data recorded.  PE:  Constitutional: No acute distress, conversant, appears states age. Eyes: Anicteric sclerae, moist conjunctiva, no lid lag Lungs: Clear to auscultation bilaterally, normal respiratory effort CV: regular rate and rhythm, no murmurs, no peripheral edema, pedal pulses 2+ GI: Soft, no masses or hepatosplenomegaly, non-tender to palpation, Gtube in place, JP Serous Skin: No rashes, palpation reveals normal turgor Psychiatric: appropriate judgment and insight, oriented to person, place, and time   Lab Results:  Recent Labs    08/26/24 0409  WBC 9.0  HGB 8.7*  HCT 26.9*  PLT 342   BMET No results for input(s): NA, K, CL, CO2, GLUCOSE, BUN, CREATININE, CALCIUM  in the last 72 hours. PT/INR No results for input(s): LABPROT, INR in the last 72 hours. ABG No results for input(s): PHART, HCO3 in the last 72 hours.  Invalid input(s): PCO2, PO2  Studies/Results: No results found.  Anti-infectives: Anti-infectives (From admission, onward)    Start     Dose/Rate Route Frequency Ordered Stop   08/21/24 1700  metroNIDAZOLE  (FLAGYL ) IVPB 500 mg  Status:  Discontinued        500 mg 100 mL/hr over 60 Minutes Intravenous Every 12 hours 08/21/24 0740 08/21/24 0741   08/21/24 1700  metroNIDAZOLE  (FLAGYL ) IVPB 500 mg  Status:  Discontinued        500 mg 100 mL/hr over 60 Minutes Intravenous Every 12 hours 08/21/24  0741 08/26/24 0848   08/21/24 1400  ceFEPIme  (MAXIPIME ) 2 g in sodium chloride  0.9 % 100 mL IVPB  Status:  Discontinued        2 g 200 mL/hr over 30 Minutes Intravenous Every 8 hours 08/21/24 0740 08/21/24 0741   08/21/24 1400  ceFEPIme  (MAXIPIME ) 2 g in sodium chloride  0.9 % 100 mL IVPB        2 g 200 mL/hr over 30 Minutes Intravenous Every 8 hours 08/21/24 0741 08/26/24 0644   08/21/24 0345  metroNIDAZOLE  (FLAGYL ) IVPB 500 mg        500 mg 100 mL/hr over 60 Minutes Intravenous  Once 08/21/24 0334 08/21/24 0533   08/21/24 0345  ceFEPIme  (MAXIPIME ) 2 g in sodium chloride  0.9 % 100 mL IVPB        2 g 200 mL/hr over 30 Minutes Intravenous  Once 08/21/24 0334 08/21/24 0429       Assessment/Plan: POD 7: S/P LAPAROSCOPY, DIAGNOSTIC, LAPAROSCOPIC GRAM PATCH REPAIR OF PERFORATED MARGINAL ULCER by Dr. Tanda on 08/21/2024    -continue tube feeds -hopefully can get drains out soon will dw/ Dr. Tanda -continue bariatric full liquid diet  LOS: 7 days    [name] 08/28/2024

## 2024-08-28 NOTE — Plan of Care (Signed)
 ?  Problem: Clinical Measurements: ?Goal: Ability to maintain clinical measurements within normal limits will improve ?Outcome: Progressing ?Goal: Will remain free from infection ?Outcome: Progressing ?Goal: Diagnostic test results will improve ?Outcome: Progressing ?  ?

## 2024-08-28 NOTE — Progress Notes (Signed)
 Mobility Specialist - Progress Note   08/28/24 1430  Mobility  Activity Ambulated with assistance  Level of Assistance Contact guard assist, steadying assist  Assistive Device Front wheel walker  Distance Ambulated (ft) 120 ft  Range of Motion/Exercises Active  Activity Response Tolerated fair  Mobility visit 1 Mobility  Mobility Specialist Start Time (ACUTE ONLY) 1410  Mobility Specialist Stop Time (ACUTE ONLY) 1430  Mobility Specialist Time Calculation (min) (ACUTE ONLY) 20 min   Pt was found in bed and agreeable to mobilize. C/o migraine and stated feeling unwell towards EOS. At EOS returned to bed with all needs met. Call bell in reach. RN notified.   Erminio Leos,  Mobility Specialist Can be reached via Secure Chat

## 2024-08-28 NOTE — Discharge Instructions (Signed)
 CONTINUE A FULL LIQUID DIET UNTIL YOUR FOLLOW UP APPOINTMENT WITH DR. TANDA IN [place].   TUBE FEEDS: OSMOLITE 1.5 OR 2.0 ARE BOTH OK, WHICHEVER INSURANCE COVERS.  -Free water : 100 ml every 4 hours, Fairlife shakes BID -Resume vitamin regimen  CONTINUE TWICE DAILY PROTONIX  (CRUSHED) AND CARAFATE  4 TIMES DAILY UNTIL YOUR APPOINTMENT IN [place].    [facility]  Please arrive at least 30 min before your appointment to complete your check in paperwork.  If you are unable to arrive 30 min prior to your appointment time we may have to cancel or reschedule you. LAPAROSCOPIC SURGERY: POST OP INSTRUCTIONS Always review your discharge instruction sheet given to you by the facility where your surgery was performed. IF YOU HAVE DISABILITY OR FAMILY LEAVE FORMS, YOU MUST BRING THEM TO THE OFFICE FOR PROCESSING.   DO NOT GIVE THEM TO YOUR DOCTOR.  PAIN CONTROL  First take acetaminophen  (Tylenol ) AND/or ibuprofen  (Advil ) to control your pain after surgery.  Follow directions on package.  Taking acetaminophen  (Tylenol ) and/or ibuprofen  (Advil ) regularly after surgery will help to control your pain and lower the amount of prescription pain medication you may need.  You should not take more than 4,000 mg (4 grams) of acetaminophen  (Tylenol ) in 24 hours.  You should not take ibuprofen  (Advil ), aleve , motrin , naprosyn  or other NSAIDS if you have a history of stomach ulcers or chronic kidney disease.  A prescription for pain medication may be given to you upon discharge.  Take your pain medication as prescribed, if you still have uncontrolled pain after taking acetaminophen  (Tylenol ) or ibuprofen  (Advil ). Use ice packs to help control pain. If you need a refill on your pain medication, please contact your pharmacy.  They will contact our office to request authorization. Prescriptions will not be filled after 5pm or on week-ends.  HOME MEDICATIONS Take your usually prescribed medications  unless otherwise directed.  DIET You should follow a light diet the first few days after arrival home.  Be sure to include lots of fluids daily. Avoid fatty, fried foods.   CONSTIPATION It is common to experience some constipation after surgery and if you are taking pain medication.  Increasing fluid intake and taking a stool softener (such as Colace) will usually help or prevent this problem from occurring.  A mild laxative (Milk of Magnesia or Miralax ) should be taken according to package instructions if there are no bowel movements after 48 hours.  WOUND/INCISION CARE Most patients will experience some swelling and bruising in the area of the incisions.  Ice packs will help.  Swelling and bruising can take several days to resolve.  Unless discharge instructions indicate otherwise, follow guidelines below  STERI-STRIPS - you may remove your outer bandages 48 hours after surgery, and you may shower at that time.  You have steri-strips (small skin tapes) in place directly over the incision.  These strips should be left on the skin for 7-10 days.   DERMABOND/SKIN GLUE - you may shower in 24 hours.  The glue will flake off over the next 2-3 weeks. Any sutures or staples will be removed at the office during your follow-up visit.  ACTIVITIES You may resume regular (light) daily activities beginning the next day--such as daily self-care, walking, climbing stairs--gradually increasing activities as tolerated.  You may have sexual intercourse when it is comfortable.  Refrain from any heavy lifting or straining until approved by your doctor. You may drive when you are no longer taking prescription pain  medication, you can comfortably wear a seatbelt, and you can safely maneuver your car and apply brakes.  FOLLOW-UP You should see your doctor in the office for a follow-up appointment approximately 2-3 weeks after your surgery.  You should have been given your post-op/follow-up appointment when your  surgery was scheduled.  If you did not receive a post-op/follow-up appointment, make sure that you call for this appointment within a day or two after you arrive home to insure a convenient appointment time.   WHEN TO CALL YOUR DOCTOR: Fever over 101.0 Inability to urinate Continued bleeding from incision. Increased pain, redness, or drainage from the incision. Increasing abdominal pain  The clinic staff is available to answer your questions during regular business hours.  Please don't hesitate to call and ask to speak to one of the nurses for clinical concerns.  If you have a medical emergency, go to the nearest emergency room or call 911.  A surgeon from [facility] is always on call at the hospital. [address], [place], [place]  [address] ? [address] [address], [place], [place]   [address] [phone] ? [phone] ? FAX [phone]

## 2024-08-28 NOTE — Plan of Care (Signed)
   Problem: Education: Goal: Knowledge of General Education information will improve Description Including pain rating scale, medication(s)/side effects and non-pharmacologic comfort measures Outcome: Progressing

## 2024-08-29 LAB — GLUCOSE, CAPILLARY
Glucose-Capillary: 112 mg/dL — ABNORMAL HIGH (ref 70–99)
Glucose-Capillary: 115 mg/dL — ABNORMAL HIGH (ref 70–99)
Glucose-Capillary: 127 mg/dL — ABNORMAL HIGH (ref 70–99)
Glucose-Capillary: 80 mg/dL (ref 70–99)
Glucose-Capillary: 83 mg/dL (ref 70–99)
Glucose-Capillary: 94 mg/dL (ref 70–99)

## 2024-08-29 LAB — BASIC METABOLIC PANEL WITH GFR
Anion gap: 4 — ABNORMAL LOW (ref 5–15)
BUN: 8 mg/dL (ref 6–20)
CO2: 32 mmol/L (ref 22–32)
Calcium: 7.9 mg/dL — ABNORMAL LOW (ref 8.9–10.3)
Chloride: 100 mmol/L (ref 98–111)
Creatinine, Ser: 0.35 mg/dL — ABNORMAL LOW (ref 0.44–1.00)
GFR, Estimated: >60 mL/min (ref >60)
Glucose, Bld: 103 mg/dL — ABNORMAL HIGH (ref 70–99)
Potassium: 4.7 mmol/L (ref 3.5–5.1)
Sodium: 136 mmol/L (ref 135–145)

## 2024-08-29 LAB — VITAMIN B1: Vitamin B1 (Thiamine): 69.7 nmol/L (ref 66.5–200.0)

## 2024-08-29 MED ORDER — MENTHOL 3 MG MT LOZG
1.0000 | LOZENGE | OROMUCOSAL | Status: DC | PRN
  Administered 2024-08-29: 3 mg via ORAL

## 2024-08-29 MED ORDER — GUAIFENESIN-DM 100-10 MG/5ML PO SYRP: 10.0000 mL | ORAL_SOLUTION | ORAL | Status: DC | PRN

## 2024-08-29 MED ORDER — NYSTATIN 100000 UNIT/ML MT SUSP
5.0000 mL | Freq: Four times a day (QID) | OROMUCOSAL | Status: DC
  Administered 2024-08-29 – 2024-08-30 (×5): 500000 [IU] via ORAL
  Filled 2024-08-29 (×5): qty 5

## 2024-08-29 NOTE — Plan of Care (Signed)
   Problem: Education: Goal: Knowledge of General Education information will improve Description: Including pain rating scale, medication(s)/side effects and non-pharmacologic comfort measures Outcome: Progressing   Problem: Activity: Goal: Risk for activity intolerance will decrease Outcome: Progressing

## 2024-08-29 NOTE — Progress Notes (Signed)
 Nutrition Follow-up  DOCUMENTATION CODES:   Non-severe (moderate) malnutrition in context of chronic illness  INTERVENTION:   -Continue Osmolite 1.5 @ 60 ml/hr x 14 hours (2200-1200) -Provides 1260 kcals, 52g protein and 640 ml H2O   After discharge patient can resume home regimen of: -Two Cal HN @ 50 ml/hr x 14 hours via G-tube -Provides 1425 kcals, 59g protein and 498 ml H2O -Free water : 100 ml every 4 hours -PO as tolerated, Fairlife shakes BID -Resume vitamin regimen  NUTRITION DIAGNOSIS:   Moderate Malnutrition related to chronic illness, altered GI function as evidenced by severe muscle depletion, moderate fat depletion.  Ongoing.  GOAL:   Patient will meet greater than or equal to 90% of their needs  Progressing  MONITOR:   PO intake, TF tolerance  ASSESSMENT:   48 y.o. 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 Just recently discharged 11/20. Readmitted now for abdominal pain and CT scan shows remnant g tube, perforated GJ tube likely at ulcer site.  11/24: s/p dx lap, lap gram patch repair of perforated marginal ulcer   Patient tolerating night feed regimen. On bariatric full liquids, still with cramping with PO.  Admission weight: 160 lbs 12/2 weight: 160 lbs -suspect copied over Last measured weight likely 139 lbs on 11/30  Medications: Dulcolax, Bentyl , folic acid , Linzess   Labs reviewed: CBGs: 112-127  Copper  from 11/26- 67 (low) Copper  from 11/27- 114 WNL Zinc  from 11/25- 24 (low) Zinc  from 11/27- 53 WNL  Diet Order:   Diet Order             Diet bariatric full liquid Room service appropriate? Yes; Fluid consistency: Thin  Diet effective now                   EDUCATION NEEDS:   Education needs have been addressed  Skin:  Skin Assessment: Skin Integrity Issues: Skin Integrity Issues:: Incisions Incisions: 11/24 abdomen  Last BM:  12/2  Height:   Ht Readings from Last  1 Encounters:  08/21/24 5' 7 (1.702 m)    Weight:   Wt Readings from Last 1 Encounters:  08/29/24 72.6 kg    BMI:  Body mass index is 25.06 kg/m.  Estimated Nutritional Needs:   Kcal:  1500-1700  Protein:  75-90g  Fluid:  1.7L/day   Morna Lee, MS, RD, LDN Inpatient Clinical Dietitian Contact via Secure chat

## 2024-08-29 NOTE — Progress Notes (Signed)
 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 She ambulated entire length of hallway with use of RW. She was also able to take few steps in room without RW but she did use furniture for at least 1 point of support. No LOB during sessoin. Pt tolerated activity well. She did have 1 episode of spasms during session-requested pain meds-RN made aware. Will recommend HHPT f/u.     If plan is discharge home, recommend the following: A little help with walking and/or transfers;A little help with bathing/dressing/bathroom;Assistance with cooking/housework;Assist for transportation;Help with stairs or ramp for entrance   Can travel by private vehicle        Equipment Recommendations  Rollator (4 wheels)    Recommendations for Other Services       Precautions / Restrictions Precautions Precautions: Fall Precaution/Restrictions Comments: abdominal, PICC, feeding tube Restrictions Weight Bearing Restrictions Per Provider Order: No     Mobility  Bed Mobility               General bed mobility comments: Supv for safety. Increased time. Good technique getting out of bed, poor technique getting back into bed    Transfers Overall transfer level: Needs assistance Equipment used: None Transfers: Sit to/from Stand Sit to Stand: Supervision           General transfer comment: Supv for safety    Ambulation/Gait Ambulation/Gait assistance: Supervision Gait Distance (Feet): 350 Feet Assistive device:  Rolling walker (2 wheels) Gait Pattern/deviations: Step-through pattern, Decreased stride length       General Gait Details: slow but steady gait. No LOB. 1 episode of spasms during walk.   Stairs             Wheelchair Mobility     Tilt Bed    Modified Rankin (Stroke Patients Only)       Balance Overall balance assessment: Needs assistance, History of Falls         Standing balance support: During functional activity, Single extremity supported Standing balance-Leahy Scale: Fair                              Hotel Manager: No apparent difficulties  Cognition Arousal: Alert Behavior During Therapy: WFL for tasks assessed/performed   PT - Cognitive impairments: No apparent impairments                         Following commands: Intact      Cueing Cueing Techniques: Verbal cues  Exercises      General Comments        Pertinent Vitals/Pain Pain Assessment Pain Assessment: Faces Faces Pain Scale: Hurts little more Pain Location: cramps in abdomen area Pain Descriptors / Indicators: Cramping Pain Intervention(s): Limited activity within patient's tolerance, Monitored during session, Repositioned    Home Living                          Prior Function  PT Goals (current goals can now be found in the care plan section) Progress towards PT goals: Progressing toward goals    Frequency    Min 3X/week      PT Plan      Co-evaluation              AM-PAC PT 6 Clicks Mobility   Outcome Measure  Help needed turning from your back to your side while in a flat bed without using bedrails?: None Help needed moving from lying on your back to sitting on the side of a flat bed without using bedrails?: None Help needed moving to and from a bed to a chair (including a wheelchair)?: None Help needed standing up from a chair using your arms (e.g., wheelchair or bedside  chair)?: None Help needed to walk in hospital room?: None Help needed climbing 3-5 steps with a railing? : A Little 6 Click Score: 23    End of Session Equipment Utilized During Treatment: Gait belt Activity Tolerance: Patient tolerated treatment well Patient left: in bed;with call bell/phone within reach   PT Visit Diagnosis: Unsteadiness on feet (R26.81);Other abnormalities of gait and mobility (R26.89);Muscle weakness (generalized) (M62.81);Difficulty in walking, not elsewhere classified (R26.2);Repeated falls (R29.6);Pain     Time: 1330-1355 PT Time Calculation (min) (ACUTE ONLY): 25 min  Charges:    $Gait Training: 23-37 mins PT General Charges $$ ACUTE PT VISIT: 1 Visit                     Dannial SQUIBB, PT Acute Rehabilitation  Office: [phone]    [place] Dannial [facility] 08/29/2024, 2:00 PM

## 2024-08-29 NOTE — Progress Notes (Addendum)
 8 Days Post-Op   Subjective/Chief Complaint: tol TFs +BMs, loose and non-bloody Reports periods of cramping pain after po intake but denies nausea/vomiting Reports leg cramps pre-dating this admission  Objective: Vital signs in last 24 hours: Temp:  [98.5 F (36.9 C)-98.7 F (37.1 C)] 98.7 F (37.1 C) (12/02 0544) Pulse Rate:  [85-102] 95 (12/02 0544) Resp:  [18] 18 (12/02 0544) BP: (92-105)/(57-74) 100/59 (12/02 0544) SpO2:  [97 %-99 %] 99 % (12/02 0544) Weight:  [72.6 kg] 72.6 kg (12/02 0500) Last BM Date : 08/29/24  Intake/Output from previous day: 12/01 0701 - 12/02 0700 In: 1830 [P.O.:1440; NG/GT:360] Out: 2385 [Urine:2350; Drains:35] Intake/Output this shift: Total I/O In: 240 [P.O.:240] Out: 300 [Urine:300]  PE:  Constitutional: No acute distress, conversant, appears states age. Eyes: Anicteric sclerae, moist conjunctiva, no lid lag Lungs: Clear to auscultation bilaterally, normal respiratory effort CV: regular rate and rhythm, no murmurs, no peripheral edema, pedal pulses 2+ GI: Soft, no masses or hepatosplenomegaly, non-tender to palpation, Gtube in place, interval removal of JPs Skin: No rashes, palpation reveals normal turgor Psychiatric: appropriate judgment and insight, oriented to person, place, and time MSK: no lower extremity edema, erythema, or tenderness  Lab Results:  No results for input(s): WBC, HGB, HCT, PLT in the last 72 hours.  BMET No results for input(s): NA, K, CL, CO2, GLUCOSE, BUN, CREATININE, CALCIUM  in the last 72 hours. PT/INR No results for input(s): LABPROT, INR in the last 72 hours. ABG No results for input(s): PHART, HCO3 in the last 72 hours.  Invalid input(s): PCO2, PO2  Studies/Results: No results found.  Anti-infectives: Anti-infectives (From admission, onward)    Start     Dose/Rate Route Frequency Ordered Stop   08/21/24 1700  metroNIDAZOLE  (FLAGYL ) IVPB 500 mg  Status:   Discontinued        500 mg 100 mL/hr over 60 Minutes Intravenous Every 12 hours 08/21/24 0740 08/21/24 0741   08/21/24 1700  metroNIDAZOLE  (FLAGYL ) IVPB 500 mg  Status:  Discontinued        500 mg 100 mL/hr over 60 Minutes Intravenous Every 12 hours 08/21/24 0741 08/26/24 0848   08/21/24 1400  ceFEPIme  (MAXIPIME ) 2 g in sodium chloride  0.9 % 100 mL IVPB  Status:  Discontinued        2 g 200 mL/hr over 30 Minutes Intravenous Every 8 hours 08/21/24 0740 08/21/24 0741   08/21/24 1400  ceFEPIme  (MAXIPIME ) 2 g in sodium chloride  0.9 % 100 mL IVPB        2 g 200 mL/hr over 30 Minutes Intravenous Every 8 hours 08/21/24 0741 08/26/24 0644   08/21/24 0345  metroNIDAZOLE  (FLAGYL ) IVPB 500 mg        500 mg 100 mL/hr over 60 Minutes Intravenous  Once 08/21/24 0334 08/21/24 0533   08/21/24 0345  ceFEPIme  (MAXIPIME ) 2 g in sodium chloride  0.9 % 100 mL IVPB        2 g 200 mL/hr over 30 Minutes Intravenous  Once 08/21/24 0334 08/21/24 0429       Assessment/Plan: POD 8: S/P LAPAROSCOPY, DIAGNOSTIC, LAPAROSCOPIC GRAM PATCH REPAIR OF PERFORATED MARGINAL ULCER by Dr. Tanda on 08/21/2024    -continue tube feeds -drains removed 12/1 -continue bariatric full liquid diet - HH PT/OT ordered, TOC working on resumping home tube feeding. - K pad and BMP ordered for her leg cramps.  - tentative plan for discharge home tomorrow 12/3 pending arrangement of home needs. - discussed pain control with patient and she will  continue tylenol , robaxin , gabapentin , and PO dilaudid  at home.    LOS: 8 days    [name] 08/29/2024

## 2024-08-29 NOTE — TOC Progression Note (Signed)
 Transition of Care [facility]) - Progression Note    Patient Details  Name: [name] MRN: [id number] Date of Birth: May 16, 1976  Transition of Care [facility]) CM/SW Contact  Jon ONEIDA Anon, RN Phone Number: 08/29/2024, 2:54 PM  Clinical Narrative:    RNCM met with pt at bedside and introduced role in DC planning. Spoke with pt about PT/OT rec for [facility] PT/OT and she is in agreement with rec. States she has worked with [facility] in the past and would like to have them again. Sent referral to [facility] and they accepted for [facility] PT/OT. HH orders are in place. Pt will resume services with [facility] for her tube feeding. States she needs new syringes. RNCM made Holley Herring aware and she advises to send pt home with a couple of syringes until they can be shipped once she is home. Pt with concerns about discharging due to continued pain. States she would like pain controlled before discharging. MD made aware. IP CM will continue to follow for any DC planning needs.      Expected Discharge Plan: Home w Home Health Services Barriers to Discharge: Continued Medical Work up               Expected Discharge Plan and Services   Discharge Planning Services: CM Consult Post Acute Care Choice: Durable Medical Equipment, Home Health Living arrangements for the past 2 months: Apartment                 DME Arranged: Tube feeding DME Agency: Other - Comment Darrin) Date DME Agency Contacted: 08/29/24 Time DME Agency Contacted: 1450 Representative spoke with at DME Agency: Holley Herring HH Arranged: PT, OT HH Agency: [facility] Date [facility] Agency Contacted: 08/29/24 Time HH Agency Contacted: 1450 Representative spoke with at [facility] Agency: Pt accepted in the HUB   Social Drivers of Health (SDOH) Interventions SDOH Screenings   Food Insecurity: No Food Insecurity (08/21/2024)  Housing: Low Risk  (08/21/2024)  Transportation Needs: No Transportation Needs (08/21/2024)  Utilities: Not  At Risk (08/21/2024)  Alcohol Screen: Medium Risk (04/08/2019)  Depression (PHQ2-9): High Risk (08/25/2023)  Financial Resource Strain: Low Risk  (04/08/2019)  Physical Activity: Inactive (04/08/2019)  Social Connections: Socially Isolated (04/08/2019)  Stress: Stress Concern Present (04/08/2019)  Tobacco Use: Medium Risk (08/21/2024)    Readmission Risk Interventions    08/22/2024    3:28 PM 08/16/2024    1:26 PM 09/15/2023   11:43 AM  Readmission Risk Prevention Plan  Transportation Screening Complete Complete Complete  PCP or Specialist Appt within 3-5 Days Complete Complete Complete  HRI or Home Care Consult Complete Complete Complete  Social Work Consult for Recovery Care Planning/Counseling Complete Complete Complete  Palliative Care Screening Not Applicable Not Applicable Not Applicable  Medication Review Oceanographer) Complete Complete Complete

## 2024-08-30 ENCOUNTER — Telehealth (HOSPITAL_COMMUNITY): Payer: Self-pay | Admitting: General Surgery

## 2024-08-30 ENCOUNTER — Inpatient Hospital Stay (HOSPITAL_COMMUNITY)

## 2024-08-30 ENCOUNTER — Other Ambulatory Visit (HOSPITAL_COMMUNITY): Payer: Self-pay

## 2024-08-30 DIAGNOSIS — K285 Chronic or unspecified gastrojejunal ulcer with perforation: Secondary | ICD-10-CM | POA: Insufficient documentation

## 2024-08-30 DIAGNOSIS — D509 Iron deficiency anemia, unspecified: Secondary | ICD-10-CM | POA: Insufficient documentation

## 2024-08-30 LAB — GLUCOSE, CAPILLARY
Glucose-Capillary: 115 mg/dL — ABNORMAL HIGH (ref 70–99)
Glucose-Capillary: 103 mg/dL — ABNORMAL HIGH (ref 70–99)

## 2024-08-30 MED ORDER — METHOCARBAMOL 500 MG PO TABS
1000.0000 mg | ORAL_TABLET | Freq: Four times a day (QID) | ORAL | 0 refills | Status: AC | PRN
  Filled 2024-08-30: qty 60, 8d supply, fill #0

## 2024-08-30 MED ORDER — ACETAMINOPHEN 500 MG PO TABS
500.0000 mg | ORAL_TABLET | Freq: Four times a day (QID) | ORAL | 0 refills | Status: AC
  Filled 2024-08-30: qty 28, 7d supply, fill #0

## 2024-08-30 MED ORDER — PANTOPRAZOLE SODIUM 40 MG PO TBEC
40.0000 mg | DELAYED_RELEASE_TABLET | Freq: Two times a day (BID) | ORAL | 0 refills | Status: DC
  Filled 2024-08-30: qty 120, 60d supply, fill #0

## 2024-08-30 MED ORDER — NYSTATIN 100000 UNIT/ML MT SUSP
5.0000 mL | Freq: Four times a day (QID) | OROMUCOSAL | 0 refills | Status: AC
  Filled 2024-08-30: qty 120, 6d supply, fill #0

## 2024-08-30 MED ORDER — OSMOLITE 1.5 CAL PO LIQD: 1000.0000 mL | ORAL | Status: DC

## 2024-08-30 MED ORDER — SUCRALFATE 1 GM/10ML PO SUSP
1.0000 g | Freq: Four times a day (QID) | ORAL | 0 refills | Status: AC
  Filled 2024-08-30: qty 350, 9d supply, fill #0

## 2024-08-30 NOTE — Progress Notes (Signed)
 Patient discharged to home. DC instructions given. Verbalized no concerns. Both TOC and home medications given to patient. Patient left unit in wheelchair pushed by April, NT accompanied by son.Left in stable condition.

## 2024-08-30 NOTE — Plan of Care (Signed)
   Problem: Clinical Measurements: Goal: Will remain free from infection Outcome: Progressing Goal: Diagnostic test results will improve Outcome: Progressing

## 2024-08-30 NOTE — Telephone Encounter (Signed)
 Patient referred to infusion pharmacy team for ambulatory infusion of IV iron .  Insurance -[employer]  Site of care - Site of care: [facility] Dx code - K28.5/D50.9 IV Iron  Therapy - Feraheme  510 mg IV x1  Infusion appointments - Scheduling team will schedule patient as soon as possible.    Krystle Oberman D. Catheline Hixon, PharmD

## 2024-08-30 NOTE — Progress Notes (Signed)
 Mobility Specialist - Progress Note   08/30/24 1000  Mobility  Activity Ambulated with assistance  Level of Assistance Contact guard assist, steadying assist  Assistive Device Front wheel walker  Distance Ambulated (ft) 200 ft  Range of Motion/Exercises Active  Activity Response Tolerated well  Mobility Referral Yes  Mobility visit 1 Mobility  Mobility Specialist Start Time (ACUTE ONLY) 1020  Mobility Specialist Stop Time (ACUTE ONLY) 1046  Mobility Specialist Time Calculation (min) (ACUTE ONLY) 26 min   Received in bed and agreed to mobility. Endorsed pain lying in bed was 6/10, 7/10 during ambulation.  Min A for bed mobility. Contact guard for ambulation.  Endorsed pain from muscle spasms in BLE and pain in lumbar back during ambulation.   No overt LOB throughout session, returned to bed with all needs met. Family in room.   Cyndee Ada Mobility Specialist

## 2024-08-30 NOTE — Progress Notes (Signed)
 I had a long d/w the pt. She is hesistant to leave d/t what she states are breathing issues that she states it feels like something is catching in the left costal margin. Patient has concerns about going home in the situation of her home.  I discussed with her that based on her current condition she is medically stable for discharge.  Unfortunately we are unable to change much that is happening with her home situation.  She currently has family with her in her room.  Chest x-ray was obtained.  This shows a normal chest x-ray with good insufflation.  I discussed with [facility] the administrator, arts that she is currently stable for discharge and has been set up with physical therapy at home as well as tube feeds for home.  Patient states that she has physical therapy set up for Friday after discharge as well as her pain clinic appointment on Friday after discharge.  All patient's questions were answered.  Patient medically stable for discharge.

## 2024-08-30 NOTE — Progress Notes (Signed)
 Discharge medications delivered to patient at the bedside.

## 2024-08-30 NOTE — TOC Transition Note (Signed)
 Transition of Care [facility]) - Discharge Note   Patient Details  Name: [name] MRN: [id number] Date of Birth: 14-Jul-1976  Transition of Care [facility]) CM/SW Contact:  NORMAN ASPEN, LCSW Phone Number: 08/30/2024, 12:19 PM   Clinical Narrative:     Pt is medically cleared for dc home today.  Met briefly with pt to review [facility] services in place with [facility].  Family to provide dc transportation.  No further IP CM needs.  Final next level of care: Home w Home Health Services Barriers to Discharge: Barriers Resolved   Patient Goals and CMS Choice Patient states their goals for this hospitalization and ongoing recovery are:: Return home with home health services CMS Medicare.gov Compare Post Acute Care list provided to:: Patient Choice offered to / list presented to : Patient [place] ownership interest in [facility].provided to:: Patient    Discharge Placement                       Discharge Plan and Services Additional resources added to the After Visit Summary for     Discharge Planning Services: CM Consult Post Acute Care Choice: Durable Medical Equipment, Home Health          DME Arranged: Tube feeding DME Agency: Other - Comment Darrin) Date DME Agency Contacted: 08/29/24 Time DME Agency Contacted: 1450 Representative spoke with at DME Agency: Holley Herring HH Arranged: PT, OT HH Agency: [facility] Date [facility] Agency Contacted: 08/29/24 Time HH Agency Contacted: 1450 Representative spoke with at [facility] Agency: Pt accepted in the HUB  Social Drivers of Health (SDOH) Interventions SDOH Screenings   Food Insecurity: No Food Insecurity (08/21/2024)  Housing: Low Risk  (08/21/2024)  Transportation Needs: No Transportation Needs (08/21/2024)  Utilities: Not At Risk (08/21/2024)  Alcohol Screen: Medium Risk (04/08/2019)  Depression (PHQ2-9): High Risk (08/25/2023)  Financial Resource Strain: Low Risk  (04/08/2019)  Physical Activity: Inactive  (04/08/2019)  Social Connections: Socially Isolated (04/08/2019)  Stress: Stress Concern Present (04/08/2019)  Tobacco Use: Medium Risk (08/21/2024)     Readmission Risk Interventions    08/22/2024    3:28 PM 08/16/2024    1:26 PM 09/15/2023   11:43 AM  Readmission Risk Prevention Plan  Transportation Screening Complete Complete Complete  PCP or Specialist Appt within 3-5 Days Complete Complete Complete  HRI or Home Care Consult Complete Complete Complete  Social Work Consult for Recovery Care Planning/Counseling Complete Complete Complete  Palliative Care Screening Not Applicable Not Applicable Not Applicable  Medication Review Oceanographer) Complete Complete Complete

## 2024-08-30 NOTE — Progress Notes (Signed)
 Called to the bedside during the patient's conversation with the physician due to patient escalation. I had a conversation with the patient and 3 family members at the bedside. The patient states that she cannot discharge due to her son not being off work until 11pm. She states that he has her clothing and is her ride home. I offered a bus ticket and cab voucher for the patient that was forcefully declined. She was able to connect with her son to pick her up by 4pm. Clothing needs, supplies, and ride barriers addressed and patient plan to discharge by 4pm with son. Patient requested something for pain and anxiety while still present in the hospital and we will appropriately address with available medicines while discharge pending.

## 2024-08-30 NOTE — Care Management Important Message (Signed)
 Important Message  Patient Details IM Letter given. Name: [name] MRN: [id number] Date of Birth: [birth year]   Important Message Given:  Yes - Medicare IM     [name] 08/30/2024, 11:19 AM

## 2024-08-30 NOTE — Discharge Summary (Signed)
 [facility] Discharge Summary   Patient ID: [name] MRN: [id number] DOB/AGE: 46/10/1975 46 y.o.  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 08/21/2024   Hematemesis 08/10/2024   Nausea & vomiting 08/09/2024   Chronic pain 08/09/2024   TIA (transient ischemic attack) 08/03/2024   Acute upper GI bleed 10/01/2023   ABLA (acute blood loss anemia) 10/01/2023   Hyperlipidemia 10/01/2023   Feeding intolerance 09/14/2023   Orthostatic hypotension 08/04/2023   Upper GI bleed 07/25/2023   Abdominal pain with vomiting 01/26/2023   Depression with anxiety 01/26/2023   Enteritis 01/26/2023   Malnutrition of moderate degree 01/20/2023   Normocytic anemia 01/20/2023   Intractable nausea and vomiting 01/18/2023   Intractable abdominal pain 01/17/2023   Pituitary microadenoma (HCC) 06/11/2022   Status post implantation of urinary electronic stimulator device 01/13/2022   Yeast infection 02/07/2020   History of urinary urgency 02/07/2020   Urinary frequency 02/07/2020   Dysuria 02/07/2020   Chronic migraine without aura, with intractable migraine, so stated, with status migrainosus 02/05/2020   S/P total knee arthroplasty, right 01/23/2020   Status post total knee replacement, right 01/23/2020   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   Chronic low back pain 10/25/2019   Headache 05/04/2019   Gait instability 05/04/2019   Suicidal ideations    MDD (major depressive disorder), recurrent severe, 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   Peripheral neuropathy 03/01/2019   Right carpal tunnel syndrome 03/01/2019   Chronic constipation 01/26/2019   Status post total left knee  replacement 03/10/2018   Degeneration of lumbar intervertebral disc 02/12/2018   Osteoarthritis of left knee 02/10/2018   B12 deficiency 12/01/2017   Protein-calorie malnutrition, moderate 03/27/2017   Psychophysiological insomnia 03/18/2017   Paresthesia 07/22/2016   Chronic headache disorder 07/22/2016   Memory difficulty 07/22/2016   Upper GI bleeding 04/04/2016   Anemia, iron  deficiency 10/01/2015   Left sided chronic colitis - segmental 06/11/2014   Pain of upper abdomen 04/26/2012   Asthma 04/26/2012   Hypokalemia 04/26/2012   Reactive depression 10/02/2009   History of gastrojejunal ulcer 10/02/2009    Consultants None   Imaging: No results found.  Procedures Dr. Tanda 08/21/24 LAPAROSCOPY, DIAGNOSTIC, LAPAROSCOPIC GRAM PATCH REPAIR OF PERFORATED MARGINAL ULCER Placement blake drains  HPI: 48 y/o F with complicated weight loss surgery history. She has had numerous issues with marginal ulcers. Her last admission was earlier this month for ab pain. She underwent an EGD that showed a large anastomotic ulcer with adherent clots.  She was discharged home with ab pain she says.  This pain continued but got worse a couple days ago and eventually she asked her son to bring her here. Denies fevers.  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 Mikell  - Laparoscopic jejunostomy tube placement 05/11/2016 - Dr. 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 Mikell for nausea and vomiting after sleeve gastrectomy  - EGD on 03/02/2017 - Dr. Ethyl noted a two superficial marginal ulcers at the gastrojejunostomy  - EGD on 05/20/2017 - Dr. Ethyl noted persistent superficial marginal ulcers  - EGD on 10/07/2017 - Dr. Ethyl noted  persistent marginal ulcer  - Revision and resection of her  gastrojejunostomy with gastrostomy tube placement 10/25/2017 - Dr. Mikell  Approximately 1/2 of her pouch was resected, and a centimeter or two of her roux limb was resected. The GJ was performed in the typical fashion with a posterior layer of vicry, a 45mm blue load to create the anastomosis, 2-0 vicryl closure of the enterotomy and 2-0 vicryl anterior outer layer. During the first attempt at firing the stapler, the anvil was located behind the roux limb and not in the lumen, so everything was taken down and redone. A g tube was placed in the remnant stomach (previously distal sleeve) medial to the roux limb.  - EGD on 01/20/23 - Dr. Saintclair noted non bleeding gastric and jejunal ulcers (biopsy negative for H. Pylori)  - EGD on 07/25/23 - Dr. Kriss noted oozing marginal ulcers treated with bipolar cautery  -Excluded stomach gastrostomy tube 09/14/23-Dr. Stechschulte.  -10/01/23-10/06/23: hospitalized for bleeding marginal ulcer  -08/09/24-08/17/24 hospitalized for ab pain, marginal ulcer, EGD showed same   She has underwent evaluation in the ER that shows tachycardia at times. WBC of 26.3.  CT scan shows remnant g tube, perforated GJ tube likely at ulcer site. There is air/fluid next to this consistent with that as well as fair amount free fluid.    Hospital Course:  She underwent the above emergent operation by Dr. Tanda which she tolerated well. She was extubated and admitted to the floor for further management. Remained NPO but was started on tube feeds via pre-existing G tube in excluded stomach on POD#2. UGI on 11/28 (POD#4) was negative for leak and she was started on bariatric clear liquids, gradually advanced to bariatric FLD. Tube feeds transitioned to 14 hr cycles. Surgical blake drains remained serosanguinous and were removed on 12/1. She worked with PT/OT.   On 08/30/24 the patients vitals were stable, pain improving, mobilizing, tolerating PO, and stable for discharge. Follow up as below.    I have personally reviewed the patients medication history on the [place] controlled substance database. She has a pain contract with another provider and therefore her narcotics will be prescribed by that provider. Patient understands this and is scheduling an appointment with her provider this week.   Physical Exam: General:  Alert, NAD, pleasant, comfortable Abd:  Soft, appropriately tender, incisions c/d/I, G tube with tube feeds running, some dry skin/flaking skin around G tube and some moisture-related skin changes but not cellulitis or evidence of yeast dermatitis. Recommended cleaning the area daily with soap and water , drying thoroughly, then applying cavilon no sting barrier to this area before placing split gauze.   Allergies as of 08/30/2024       Reactions   Ciprofloxacin  Itching, Dermatitis, Rash   Coconut (cocos Nucifera) Anaphylaxis, Itching   Coconut Oil Anaphylaxis, Itching   Doxycycline Nausea And Vomiting   Oxycodone Anaphylaxis   Penicillamine Hives   Trazodone  And Nefazodone Rash   Hydroxyzine  Hives, Rash   Nefazodone Rash   Penicillins Rash, Other (See Comments)   Has patient had a PCN reaction causing immediate rash, facial/tongue/throat swelling, SOB or lightheadedness with hypotension: Yes   Silicone Rash   Tape Rash, Other (See Comments)   And, paper tape causes a rash if worn for a prolong period of time   Trazodone  Rash   Wound Dressing Adhesive Rash        Medication List     STOP taking these medications    sucralfate  1 g tablet  Commonly known as: CARAFATE  Replaced by: sucralfate  1 GM/10ML suspension       TAKE these medications    ACCRUFeR 30 MG Caps Generic drug: Ferric Maltol Take 30 mg by mouth at bedtime.   acetaminophen  500 MG tablet Commonly known as: TYLENOL  Take 1 tablet (500 mg total) by mouth every 6 (six) hours for 7 days.   albuterol  108 (90 Base) MCG/ACT inhaler Commonly known as: VENTOLIN  HFA Inhale 2 puffs into the  lungs every 6 (six) hours as needed for wheezing or shortness of breath.   amitriptyline  25 MG tablet Commonly known as: ELAVIL  Take 25 mg by mouth at bedtime.   atorvastatin  40 MG tablet Commonly known as: LIPITOR Take 40 mg by mouth daily.   Botox  200 units injection Generic drug: botulinum toxin Type A  INJECT 155 UNITS INTRAMUSCULARLY INTO HEAD AND NECK MUSCLES EVERY 3 MONTHS   Buprenorphine  HCl 600 MCG Film Place 600 mcg under the tongue in the morning and at bedtime.   buPROPion  150 MG 24 hr tablet Commonly known as: WELLBUTRIN  XL Take 150 mg by mouth daily. Take with 300mg  for total dose 450mg    buPROPion  300 MG 24 hr tablet Commonly known as: WELLBUTRIN  XL Take 300 mg by mouth every morning. Take along with 150 mg=450 mg   busPIRone  15 MG tablet Commonly known as: BUSPAR  Take 15 mg by mouth 3 (three) times daily.   clonazePAM  1 MG tablet Commonly known as: KLONOPIN  Take 1 mg by mouth 2 (two) times daily as needed for anxiety.   cyanocobalamin  1000 MCG/ML injection Commonly known as: VITAMIN B12 Inject 1,000 mcg into the muscle 2 (two) times a week.   diclofenac  Sodium 1 % Gel Commonly known as: VOLTAREN  Apply 2 g topically 4 (four) times daily as needed (Pain).   dicyclomine  10 MG capsule Commonly known as: BENTYL  Take 10 mg by mouth at bedtime.   DULoxetine  60 MG capsule Commonly known as: CYMBALTA  Take 1 capsule (60 mg total) by mouth 2 (two) times daily.   EPINEPHrine  0.3 mg/0.3 mL Soaj injection Commonly known as: EPI-PEN Inject 0.3 mg into the muscle as needed for anaphylaxis.   feeding supplement (OSMOLITE 1.5 CAL) Liqd Place 1,000 mLs into feeding tube daily.   gabapentin  600 MG tablet Commonly known as: NEURONTIN  Take 600 mg by mouth 3 (three) times daily.   Gerhardt's butt cream Crea Apply 1 Application topically 3 (three) times daily.   HYDROmorphone  4 MG tablet Commonly known as: DILAUDID  Take 4 mg by mouth every 6 (six) hours as  needed for moderate pain (pain score 4-6) or severe pain (pain score 7-10).   linaclotide  290 MCG Caps capsule Commonly known as: LINZESS  Take 290 mcg by mouth daily before breakfast.   magnesium  oxide 400 (240 Mg) MG tablet Commonly known as: MAG-OX Take 400 mg by mouth daily.   mesalamine  1.2 g EC tablet Commonly known as: LIALDA  Take 2.4 g by mouth 2 (two) times daily.   Methocarbamol  1000 MG Tabs Take 750 mg by mouth every 6 (six) hours as needed (use for muscle cramps/pain).   midodrine  10 MG tablet Commonly known as: PROAMATINE  Take 1 tablet (10 mg total) by mouth 3 (three) times daily with meals.   multivitamin with minerals Tabs tablet Take 1 tablet by mouth daily.   Narcan  4 MG/0.1ML Liqd nasal spray kit Generic drug: naloxone  Place 1 spray into the nose daily as needed (overdose).   nystatin  100000 UNIT/ML suspension Commonly known as: MYCOSTATIN  Take  5 mLs (500,000 Units total) by mouth 4 (four) times daily for 6 days.   ondansetron  8 MG disintegrating tablet Commonly known as: ZOFRAN -ODT Take 8 mg by mouth every 8 (eight) hours as needed for nausea, vomiting or refractory nausea / vomiting. What changed: Another medication with the same name was removed. Continue taking this medication, and follow the directions you see here.   oxybutynin  5 MG 24 hr tablet Commonly known as: DITROPAN -XL Take 5 mg by mouth daily.   pantoprazole  40 MG tablet Commonly known as: PROTONIX  Take 1 tablet (40 mg total) by mouth 2 (two) times daily before a meal. Crush and take twice daily What changed:  when to take this additional instructions   Qulipta  30 MG Tabs Generic drug: Atogepant  TAKE 1 TABLET BY MOUTH EVERY DAY   sucralfate  1 GM/10ML suspension Commonly known as: Carafate  Take 10 mLs (1 g total) by mouth 4 (four) times daily. Replaces: sucralfate  1 g tablet   Symproic  0.2 MG Tabs Generic drug: Naldemedine Tosylate  Take 1 tablet by mouth daily.   thiamine  100  MG tablet Commonly known as: Vitamin B-1 Take 100 mg by mouth daily at 12 noon.   valACYclovir  1000 MG tablet Commonly known as: VALTREX  Take 1,000 mg by mouth 2 (two) times daily.   Vitamin D  (Ergocalciferol ) 1.25 MG (50000 UNIT) Caps capsule Commonly known as: DRISDOL  Take 50,000 Units by mouth once a week.   Vitamin D3 1.25 MG (50000 UT) Caps Take 50,000 Units by mouth every 7 (seven) days.               Durable Medical Equipment  (From admission, onward)           Start     Ordered   08/30/24 0854  For home use only DME Walker  Once       Comments: Rollator, 4 wheels  Question:  Patient needs a walker to treat with the following condition  Answer:  H/O abdominal surgery   08/30/24 0854              Contact information for follow-up providers     Maczis, Puja Gosai, PA-C Follow up on 09/19/2024.   Specialty: General Surgery Why: 9:00am, Arrive 30 minutes prior to your appointment time, Please bring your insurance card and photo ID Contact information: [address] [place] [address] [facility] [place] [address] [phone]         Tanda Locus, MD Follow up on 10/11/2024.   Specialty: General Surgery Why: 8:30am, Arrive 15 minutes prior to your appointment time, Please bring your insurance card and photo ID Contact information: [address] [place] [address] [phone]              Contact information for after-discharge care     Home Medical Care     [facility]) .   Service: Home Health Services Contact information: [address] [place]  [address] [phone]                     Signed: Almarie Pringle, Dr[facility] 08/30/2024, 9:00 AM

## 2024-09-06 ENCOUNTER — Encounter (HOSPITAL_COMMUNITY): Payer: Self-pay | Admitting: General Surgery

## 2024-09-06 ENCOUNTER — Ambulatory Visit: Admitting: Neurology

## 2024-09-06 VITALS — BP 106/88

## 2024-09-06 DIAGNOSIS — G43711 Chronic migraine without aura, intractable, with status migrainosus: Secondary | ICD-10-CM | POA: Diagnosis not present

## 2024-09-06 DIAGNOSIS — G8929 Other chronic pain: Secondary | ICD-10-CM

## 2024-09-06 MED ORDER — ONABOTULINUMTOXINA 200 UNITS IJ SOLR
155.0000 [IU] | Freq: Once | INTRAMUSCULAR | Status: AC
  Administered 2024-09-06: 155 [IU] via INTRAMUSCULAR

## 2024-09-06 NOTE — Progress Notes (Signed)
 Botox - 200 units x 1 vial Lot: [id number] Expiration: 2027/12 NDC: 0023-3921-02  Bacteriostatic 0.9% Sodium Chloride - 4 mL  Lot: [id number] Expiration: 07/28/25 NDC: 9590803397  Dx: H56.288  B/B Witnessed by WILLAM DEL CMA

## 2024-09-06 NOTE — Progress Notes (Signed)
° °  BOTOX  PROCEDURE NOTE FOR MIGRAINE HEADACHE  HISTORY: [name] is here for Botox . Last was 06/14/24 with me.  Also on Qulipta  30 mg daily. Imitrex  as needed. With Botox  wearing off, having near daily migraines. Health issues GI related with dehiscence if intestines admitted.   Description of procedure:  The patient was placed in a sitting position. The standard protocol was used for Botox  as follows, with 5 units of Botox  injected at each site:   -Procerus muscle, midline injection  -Corrugator muscle, bilateral injection  -Frontalis muscle, bilateral injection, with 2 sites each side, medial injection was performed in the upper one third of the frontalis muscle, in the region vertical from the medial inferior edge of the superior orbital rim. The lateral injection was again in the upper one third of the forehead vertically above the lateral limbus of the cornea, 1.5 cm lateral to the medial injection site.  -Temporalis muscle injection, 4 sites, bilaterally. The first injection was 3 cm above the tragus of the ear, second injection site was 1.5 cm to 3 cm up from the first injection site in line with the tragus of the ear. The third injection site was 1.5-3 cm forward between the first 2 injection sites. The fourth injection site was 1.5 cm posterior to the second injection site.  -Occipitalis muscle injection, 3 sites, bilaterally. The first injection was done one half way between the occipital protuberance and the tip of the mastoid process behind the ear. The second injection site was done lateral and superior to the first, 1 fingerbreadth from the first injection. The third injection site was 1 fingerbreadth superiorly and medially from the first injection site.  -Cervical paraspinal muscle injection, 2 sites, bilateral, the first injection site was 1 cm from the midline of the cervical spine, 3 cm inferior to the lower border of the occipital protuberance. The second  injection site was 1.5 cm superiorly and laterally to the first injection site.  -Trapezius muscle injection was performed at 3 sites, bilaterally. The first injection site was in the upper trapezius muscle halfway between the inflection point of the neck, and the acromion. The second injection site was one half way between the acromion and the first injection site. The third injection was done between the first injection site and the inflection point of the neck.   A 200 unit bottle of Botox  was used, 155 units were injected, the rest of the Botox  was wasted. The patient tolerated the procedure well, there were no complications of the above procedure.  Botox  NDC 9976-6078-97 Lot number [id number] Expiration date 08/28/2026 BB

## 2024-09-08 ENCOUNTER — Telehealth (HOSPITAL_COMMUNITY): Payer: Self-pay

## 2024-09-08 NOTE — Telephone Encounter (Signed)
 Auth Submission: APPROVED Site of care: Site of care: [facility] Payer: [employer] Commercial Medication & CPT/J Code(s) submitted: Feraheme  (ferumoxytol ) [id number] Diagnosis Code:  Route of submission (phone, fax, portal): portal Phone # Fax # Auth type: Buy/Bill HB Units/visits requested: 510mg  x 1 dose Reference number: [id number] Approval from: 08/30/24 to 08/30/25

## 2024-09-19 ENCOUNTER — Other Ambulatory Visit (HOSPITAL_COMMUNITY): Payer: Self-pay | Admitting: Student

## 2024-09-19 DIAGNOSIS — R1012 Left upper quadrant pain: Secondary | ICD-10-CM

## 2024-09-22 ENCOUNTER — Ambulatory Visit (HOSPITAL_COMMUNITY)
Admission: RE | Admit: 2024-09-22 | Discharge: 2024-09-22 | Disposition: A | Discharge status: Home / Self Care | Source: Ambulatory Visit | Attending: Internal Medicine | Admitting: Internal Medicine

## 2024-09-22 VITALS — BP 118/76 | HR 76 | Temp 98.3°F | Resp 18

## 2024-09-22 DIAGNOSIS — K285 Chronic or unspecified gastrojejunal ulcer with perforation: Secondary | ICD-10-CM | POA: Diagnosis present

## 2024-09-22 DIAGNOSIS — D509 Iron deficiency anemia, unspecified: Secondary | ICD-10-CM | POA: Diagnosis present

## 2024-09-22 MED ORDER — SODIUM CHLORIDE 0.9 % IV SOLN
510.0000 mg | Freq: Once | INTRAVENOUS | Status: AC
  Administered 2024-09-22: 510 mg via INTRAVENOUS
  Filled 2024-09-22: qty 17

## 2024-09-22 MED ORDER — SODIUM CHLORIDE 0.9 % IV SOLN: INTRAVENOUS | Status: DC | PRN

## 2024-09-22 NOTE — Progress Notes (Signed)
 PATIENT CARE CENTER NOTE:  Diagnosis: Iron  Deficiency Anemia  Provider: Augustus Norris PA  Procedure: 510mg  Feraheme  IV Infusion   Patient received IV Feraheme  510mg  as ordered by Simaan, Elizabeth PA. Observed for at least 30 minutes post infusion. Tolerated well, vitals stable, discharge instructions given , verbalized understanding. Patient alert, oriented, and ambulatory at the time of discharge.

## 2024-09-23 ENCOUNTER — Ambulatory Visit (HOSPITAL_BASED_OUTPATIENT_CLINIC_OR_DEPARTMENT_OTHER)
Admission: RE | Admit: 2024-09-23 | Discharge: 2024-09-23 | Disposition: A | Source: Ambulatory Visit | Attending: Student

## 2024-09-23 DIAGNOSIS — R1012 Left upper quadrant pain: Secondary | ICD-10-CM | POA: Insufficient documentation

## 2024-09-23 MED ORDER — IOHEXOL 300 MG/ML  SOLN
75.0000 mL | Freq: Once | INTRAMUSCULAR | Status: AC | PRN
  Administered 2024-09-23: 75 mL via INTRAVENOUS

## 2024-09-23 MED ORDER — BARIUM SULFATE 2 % PO SUSP
450.0000 mL | Freq: Once | ORAL | Status: AC
  Administered 2024-09-23: 450 mL via ORAL

## 2024-09-25 ENCOUNTER — Telehealth: Payer: Self-pay | Admitting: Neurology

## 2024-09-25 ENCOUNTER — Ambulatory Visit (INDEPENDENT_AMBULATORY_CARE_PROVIDER_SITE_OTHER): Payer: Self-pay | Admitting: Student

## 2024-09-25 NOTE — Telephone Encounter (Signed)
 Pt cousins called with  Pt  to request to see Dr. Vear . Pt would like to continue care and treatment with Dr. Vear if possible

## 2024-10-02 ENCOUNTER — Emergency Department (HOSPITAL_COMMUNITY)

## 2024-10-02 ENCOUNTER — Inpatient Hospital Stay (HOSPITAL_COMMUNITY)
Admission: EM | Admit: 2024-10-02 | Discharge: 2024-10-15 | DRG: 377 | Disposition: A | Discharge status: Home Health | Attending: Internal Medicine | Admitting: Internal Medicine

## 2024-10-02 DIAGNOSIS — Z8249 Family history of ischemic heart disease and other diseases of the circulatory system: Secondary | ICD-10-CM | POA: Diagnosis not present

## 2024-10-02 DIAGNOSIS — F418 Other specified anxiety disorders: Secondary | ICD-10-CM | POA: Diagnosis present

## 2024-10-02 DIAGNOSIS — I951 Orthostatic hypotension: Secondary | ICD-10-CM | POA: Diagnosis present

## 2024-10-02 DIAGNOSIS — K9423 Gastrostomy malfunction: Secondary | ICD-10-CM | POA: Diagnosis not present

## 2024-10-02 DIAGNOSIS — Z880 Allergy status to penicillin: Secondary | ICD-10-CM

## 2024-10-02 DIAGNOSIS — Z881 Allergy status to other antibiotic agents status: Secondary | ICD-10-CM

## 2024-10-02 DIAGNOSIS — R1085 Abdominal pain of multiple sites: Secondary | ICD-10-CM

## 2024-10-02 DIAGNOSIS — D509 Iron deficiency anemia, unspecified: Secondary | ICD-10-CM | POA: Diagnosis present

## 2024-10-02 DIAGNOSIS — Z96653 Presence of artificial knee joint, bilateral: Secondary | ICD-10-CM | POA: Diagnosis present

## 2024-10-02 DIAGNOSIS — K9089 Other intestinal malabsorption: Secondary | ICD-10-CM | POA: Diagnosis not present

## 2024-10-02 DIAGNOSIS — Z681 Body mass index (BMI) 19 or less, adult: Secondary | ICD-10-CM | POA: Diagnosis not present

## 2024-10-02 DIAGNOSIS — K219 Gastro-esophageal reflux disease without esophagitis: Secondary | ICD-10-CM | POA: Diagnosis present

## 2024-10-02 DIAGNOSIS — D500 Iron deficiency anemia secondary to blood loss (chronic): Secondary | ICD-10-CM | POA: Diagnosis not present

## 2024-10-02 DIAGNOSIS — K922 Gastrointestinal hemorrhage, unspecified: Secondary | ICD-10-CM | POA: Diagnosis present

## 2024-10-02 DIAGNOSIS — F32A Depression, unspecified: Secondary | ICD-10-CM | POA: Diagnosis present

## 2024-10-02 DIAGNOSIS — D62 Acute posthemorrhagic anemia: Secondary | ICD-10-CM | POA: Diagnosis present

## 2024-10-02 DIAGNOSIS — E785 Hyperlipidemia, unspecified: Secondary | ICD-10-CM | POA: Diagnosis present

## 2024-10-02 DIAGNOSIS — F419 Anxiety disorder, unspecified: Secondary | ICD-10-CM | POA: Diagnosis present

## 2024-10-02 DIAGNOSIS — Z79899 Other long term (current) drug therapy: Secondary | ICD-10-CM

## 2024-10-02 DIAGNOSIS — K920 Hematemesis: Secondary | ICD-10-CM | POA: Diagnosis present

## 2024-10-02 DIAGNOSIS — K5190 Ulcerative colitis, unspecified, without complications: Secondary | ICD-10-CM | POA: Diagnosis present

## 2024-10-02 DIAGNOSIS — E43 Unspecified severe protein-calorie malnutrition: Secondary | ICD-10-CM | POA: Diagnosis present

## 2024-10-02 DIAGNOSIS — Z885 Allergy status to narcotic agent status: Secondary | ICD-10-CM

## 2024-10-02 DIAGNOSIS — Z903 Acquired absence of stomach [part of]: Secondary | ICD-10-CM

## 2024-10-02 DIAGNOSIS — Z9884 Bariatric surgery status: Secondary | ICD-10-CM

## 2024-10-02 DIAGNOSIS — F1190 Opioid use, unspecified, uncomplicated: Secondary | ICD-10-CM | POA: Diagnosis not present

## 2024-10-02 DIAGNOSIS — K285 Chronic or unspecified gastrojejunal ulcer with perforation: Secondary | ICD-10-CM | POA: Diagnosis not present

## 2024-10-02 DIAGNOSIS — D529 Folate deficiency anemia, unspecified: Secondary | ICD-10-CM | POA: Diagnosis present

## 2024-10-02 DIAGNOSIS — K529 Noninfective gastroenteritis and colitis, unspecified: Secondary | ICD-10-CM | POA: Diagnosis present

## 2024-10-02 DIAGNOSIS — Z931 Gastrostomy status: Secondary | ICD-10-CM | POA: Diagnosis not present

## 2024-10-02 DIAGNOSIS — K5909 Other constipation: Secondary | ICD-10-CM | POA: Diagnosis present

## 2024-10-02 DIAGNOSIS — E440 Moderate protein-calorie malnutrition: Secondary | ICD-10-CM | POA: Diagnosis present

## 2024-10-02 DIAGNOSIS — Z90710 Acquired absence of both cervix and uterus: Secondary | ICD-10-CM | POA: Diagnosis not present

## 2024-10-02 DIAGNOSIS — Z87440 Personal history of urinary (tract) infections: Secondary | ICD-10-CM

## 2024-10-02 DIAGNOSIS — K284 Chronic or unspecified gastrojejunal ulcer with hemorrhage: Secondary | ICD-10-CM | POA: Diagnosis present

## 2024-10-02 DIAGNOSIS — Z825 Family history of asthma and other chronic lower respiratory diseases: Secondary | ICD-10-CM | POA: Diagnosis not present

## 2024-10-02 DIAGNOSIS — K259 Gastric ulcer, unspecified as acute or chronic, without hemorrhage or perforation: Secondary | ICD-10-CM | POA: Diagnosis not present

## 2024-10-02 DIAGNOSIS — J45909 Unspecified asthma, uncomplicated: Secondary | ICD-10-CM | POA: Diagnosis present

## 2024-10-02 DIAGNOSIS — Z91018 Allergy to other foods: Secondary | ICD-10-CM

## 2024-10-02 DIAGNOSIS — G894 Chronic pain syndrome: Secondary | ICD-10-CM | POA: Diagnosis not present

## 2024-10-02 DIAGNOSIS — G8929 Other chronic pain: Secondary | ICD-10-CM | POA: Diagnosis present

## 2024-10-02 DIAGNOSIS — Z87891 Personal history of nicotine dependence: Secondary | ICD-10-CM

## 2024-10-02 DIAGNOSIS — Z91048 Other nonmedicinal substance allergy status: Secondary | ICD-10-CM

## 2024-10-02 LAB — CBC WITH DIFFERENTIAL/PLATELET
Abs Immature Granulocytes: 0.04 K/uL (ref 0.00–0.07)
Basophils Absolute: 0 K/uL (ref 0.0–0.1)
Basophils Relative: 0 %
Eosinophils Absolute: 0.1 K/uL (ref 0.0–0.5)
Eosinophils Relative: 1 %
HCT: 34.6 % — ABNORMAL LOW (ref 36.0–46.0)
Hemoglobin: 10.6 g/dL — ABNORMAL LOW (ref 12.0–15.0)
Immature Granulocytes: 0 %
Lymphocytes Relative: 11 %
Lymphs Abs: 1.6 K/uL (ref 0.7–4.0)
MCH: 28.7 pg (ref 26.0–34.0)
MCHC: 30.6 g/dL (ref 30.0–36.0)
MCV: 93.8 fL (ref 80.0–100.0)
Monocytes Absolute: 0.8 K/uL (ref 0.1–1.0)
Monocytes Relative: 5 %
Neutro Abs: 12.7 K/uL — ABNORMAL HIGH (ref 1.7–7.7)
Neutrophils Relative %: 83 %
Platelets: 417 K/uL — ABNORMAL HIGH (ref 150–400)
RBC: 3.69 MIL/uL — ABNORMAL LOW (ref 3.87–5.11)
RDW: 16.4 % — ABNORMAL HIGH (ref 11.5–15.5)
WBC: 15.3 K/uL — ABNORMAL HIGH (ref 4.0–10.5)
nRBC: 0 % (ref 0.0–0.2)

## 2024-10-02 LAB — COMPREHENSIVE METABOLIC PANEL WITH GFR
ALT: 7 U/L (ref 0–44)
AST: 13 U/L — ABNORMAL LOW (ref 15–41)
Albumin: 3.2 g/dL — ABNORMAL LOW (ref 3.5–5.0)
Alkaline Phosphatase: 77 U/L (ref 38–126)
Anion gap: 8 (ref 5–15)
BUN: 19 mg/dL (ref 6–20)
CO2: 27 mmol/L (ref 22–32)
Calcium: 8.9 mg/dL (ref 8.9–10.3)
Chloride: 104 mmol/L (ref 98–111)
Creatinine, Ser: 0.47 mg/dL (ref 0.44–1.00)
GFR, Estimated: >60 mL/min
Glucose, Bld: 195 mg/dL — ABNORMAL HIGH (ref 70–99)
Potassium: 3.7 mmol/L (ref 3.5–5.1)
Sodium: 138 mmol/L (ref 135–145)
Total Bilirubin: <0.2 mg/dL (ref 0.0–1.2)
Total Protein: 6.1 g/dL — ABNORMAL LOW (ref 6.5–8.1)

## 2024-10-02 LAB — I-STAT CHEM 8, ED
BUN: 21 mg/dL — ABNORMAL HIGH (ref 6–20)
BUN: 21 mg/dL — ABNORMAL HIGH (ref 6–20)
Calcium, Ion: 1.18 mmol/L (ref 1.15–1.40)
Calcium, Ion: 1.19 mmol/L (ref 1.15–1.40)
Chloride: 100 mmol/L (ref 98–111)
Chloride: 101 mmol/L (ref 98–111)
Creatinine, Ser: 0.6 mg/dL (ref 0.44–1.00)
Creatinine, Ser: 0.5 mg/dL (ref 0.44–1.00)
Glucose, Bld: 131 mg/dL — ABNORMAL HIGH (ref 70–99)
Glucose, Bld: 179 mg/dL — ABNORMAL HIGH (ref 70–99)
HCT: 36 % (ref 36.0–46.0)
HCT: 31 % — ABNORMAL LOW (ref 36.0–46.0)
Hemoglobin: 12.2 g/dL (ref 12.0–15.0)
Hemoglobin: 10.5 g/dL — ABNORMAL LOW (ref 12.0–15.0)
Potassium: 4.3 mmol/L (ref 3.5–5.1)
Potassium: 3.7 mmol/L (ref 3.5–5.1)
Sodium: 137 mmol/L (ref 135–145)
Sodium: 137 mmol/L (ref 135–145)
TCO2: 29 mmol/L (ref 22–32)
TCO2: 29 mmol/L (ref 22–32)

## 2024-10-02 LAB — TYPE AND SCREEN
ABO/RH(D): O POS
Antibody Screen: NEGATIVE

## 2024-10-02 LAB — I-STAT CG4 LACTIC ACID, ED
Lactic Acid, Venous: 1.4 mmol/L (ref 0.5–1.9)
Lactic Acid, Venous: 1 mmol/L (ref 0.5–1.9)

## 2024-10-02 LAB — PROTIME-INR
INR: 1 (ref 0.8–1.2)
Prothrombin Time: 13.4 s (ref 11.4–15.2)

## 2024-10-02 MED ORDER — BACLOFEN 10 MG PO TABS: 10.0000 mg | ORAL_TABLET | Freq: Three times a day (TID) | ORAL | Status: DC

## 2024-10-02 MED ORDER — HYDROMORPHONE HCL 1 MG/ML IJ SOLN
1.0000 mg | Freq: Once | INTRAMUSCULAR | Status: AC
  Administered 2024-10-02: 1 mg via INTRAVENOUS
  Filled 2024-10-02: qty 1

## 2024-10-02 MED ORDER — IOHEXOL 300 MG/ML  SOLN
100.0000 mL | Freq: Once | INTRAMUSCULAR | Status: AC | PRN
  Administered 2024-10-02: 100 mL via INTRAVENOUS

## 2024-10-02 MED ORDER — FENTANYL CITRATE (PF) 50 MCG/ML IJ SOSY
50.0000 ug | PREFILLED_SYRINGE | Freq: Once | INTRAMUSCULAR | Status: AC
  Administered 2024-10-02: 50 ug via INTRAVENOUS
  Filled 2024-10-02: qty 1

## 2024-10-02 MED ORDER — ACETAMINOPHEN 650 MG RE SUPP: 650.0000 mg | Freq: Four times a day (QID) | RECTAL | Status: DC | PRN

## 2024-10-02 MED ORDER — PANTOPRAZOLE SODIUM 40 MG IV SOLR
40.0000 mg | Freq: Two times a day (BID) | INTRAVENOUS | Status: DC
  Administered 2024-10-03 – 2024-10-13 (×22): 40 mg via INTRAVENOUS
  Filled 2024-10-02 (×23): qty 10

## 2024-10-02 MED ORDER — NALDEMEDINE TOSYLATE 0.2 MG PO TABS: 0.2000 mg | ORAL_TABLET | Freq: Every day | ORAL | Status: DC

## 2024-10-02 MED ORDER — ACETAMINOPHEN 325 MG PO TABS
650.0000 mg | ORAL_TABLET | Freq: Four times a day (QID) | ORAL | Status: DC | PRN
  Administered 2024-10-06 – 2024-10-12 (×3): 650 mg via ORAL
  Filled 2024-10-02 (×3): qty 2

## 2024-10-02 MED ORDER — HYDROMORPHONE HCL 1 MG/ML IJ SOLN
1.0000 mg | INTRAMUSCULAR | Status: DC | PRN
  Administered 2024-10-03 – 2024-10-04 (×5): 1 mg via INTRAVENOUS
  Filled 2024-10-02 (×5): qty 1

## 2024-10-02 MED ORDER — LACTATED RINGERS IV SOLN: INTRAVENOUS | Status: AC

## 2024-10-02 MED ORDER — METHOCARBAMOL 500 MG PO TABS
1000.0000 mg | ORAL_TABLET | Freq: Four times a day (QID) | ORAL | Status: DC | PRN
  Administered 2024-10-03 – 2024-10-15 (×10): 1000 mg via ORAL
  Filled 2024-10-02 (×11): qty 2

## 2024-10-02 MED ORDER — DULOXETINE HCL 60 MG PO CPEP
60.0000 mg | ORAL_CAPSULE | Freq: Two times a day (BID) | ORAL | Status: DC
  Administered 2024-10-03 – 2024-10-15 (×26): 60 mg via ORAL
  Filled 2024-10-02 (×18): qty 1
  Filled 2024-10-02: qty 2
  Filled 2024-10-02 (×3): qty 1
  Filled 2024-10-02: qty 2
  Filled 2024-10-02 (×3): qty 1

## 2024-10-02 MED ORDER — BUPROPION HCL ER (XL) 300 MG PO TB24
300.0000 mg | ORAL_TABLET | Freq: Every morning | ORAL | Status: DC
  Administered 2024-10-03: 300 mg via ORAL
  Filled 2024-10-02: qty 2

## 2024-10-02 MED ORDER — SORBITOL 70 % SOLN: 30.0000 mL | Freq: Every day | Status: DC | PRN

## 2024-10-02 MED ORDER — CLONAZEPAM 1 MG PO TABS
1.0000 mg | ORAL_TABLET | Freq: Two times a day (BID) | ORAL | Status: DC | PRN
  Administered 2024-10-03 – 2024-10-13 (×14): 1 mg via ORAL
  Filled 2024-10-02 (×13): qty 1

## 2024-10-02 MED ORDER — ONDANSETRON HCL 4 MG PO TABS: 4.0000 mg | ORAL_TABLET | Freq: Four times a day (QID) | ORAL | Status: DC | PRN

## 2024-10-02 MED ORDER — PANTOPRAZOLE SODIUM 40 MG IV SOLR
80.0000 mg | Freq: Once | INTRAVENOUS | Status: AC
  Administered 2024-10-02: 80 mg via INTRAVENOUS
  Filled 2024-10-02: qty 20

## 2024-10-02 MED ORDER — MESALAMINE 1.2 G PO TBEC
2.4000 g | DELAYED_RELEASE_TABLET | Freq: Two times a day (BID) | ORAL | Status: DC
  Administered 2024-10-03 – 2024-10-15 (×25): 2.4 g via ORAL
  Filled 2024-10-02 (×26): qty 2

## 2024-10-02 MED ORDER — ONDANSETRON HCL 4 MG/2ML IJ SOLN
4.0000 mg | Freq: Four times a day (QID) | INTRAMUSCULAR | Status: DC | PRN
  Administered 2024-10-02 – 2024-10-15 (×21): 4 mg via INTRAVENOUS
  Filled 2024-10-02 (×23): qty 2

## 2024-10-02 MED ORDER — ONDANSETRON HCL 4 MG/2ML IJ SOLN
4.0000 mg | Freq: Once | INTRAMUSCULAR | Status: AC
  Administered 2024-10-02: 4 mg via INTRAVENOUS
  Filled 2024-10-02: qty 2

## 2024-10-02 MED ORDER — SODIUM CHLORIDE 0.9% FLUSH
3.0000 mL | Freq: Two times a day (BID) | INTRAVENOUS | Status: DC
  Administered 2024-10-02 – 2024-10-15 (×26): 3 mL via INTRAVENOUS

## 2024-10-02 MED ORDER — LINACLOTIDE 145 MCG PO CAPS
290.0000 ug | ORAL_CAPSULE | Freq: Every day | ORAL | Status: DC
  Administered 2024-10-04 – 2024-10-15 (×12): 290 ug via ORAL
  Filled 2024-10-02 (×14): qty 2

## 2024-10-02 MED ORDER — MISOPROSTOL 200 MCG PO TABS
200.0000 ug | ORAL_TABLET | Freq: Every day | ORAL | Status: DC
  Administered 2024-10-03 – 2024-10-15 (×13): 200 ug via ORAL
  Filled 2024-10-02 (×13): qty 1

## 2024-10-02 MED ORDER — MIDODRINE HCL 5 MG PO TABS
10.0000 mg | ORAL_TABLET | Freq: Three times a day (TID) | ORAL | Status: DC
  Administered 2024-10-03 – 2024-10-12 (×27): 10 mg via ORAL
  Filled 2024-10-02 (×27): qty 2

## 2024-10-02 MED ORDER — LACTATED RINGERS IV BOLUS
1000.0000 mL | Freq: Once | INTRAVENOUS | Status: AC
  Administered 2024-10-02: 1000 mL via INTRAVENOUS

## 2024-10-02 NOTE — H&P (Signed)
 " History and Physical    Patient: [name] FMW:[id number] DOB: April 27, 1976 DOA: 10/02/2024 DOS: the patient was seen and examined on 10/02/2024 PCP: Orion Kerns, MD  Patient coming from: Home  Chief Complaint:  Chief Complaint  Patient presents with   Hematemesis   HPI: [name] is a 49 y.o. female with medical history significant for migraines and recurrent upper GI bleeds secondary to ulcers in the area of her gastrojejunostomy tube.  The patient has had years of complications since bariatric surgery in 2017. She has chronic abdominal pain and is on chronic narcotics.  She has a feeding tube in place.  She also receives IV iron  infusions for chronic blood loss anemia.  Her last iron  infusion was last week. She comes in with recurrent episodes vomiting coffee ground emesis since yesterday.  She called [facility]'s office and was advised to come to the ED.  The patient recognizes the symptoms as she has had this happen many times before.  She reports feeling very weak.  She has been vomiting up her chronic Dilaudid  so she is having increased abdominal pain as well. Denies any fever or diarrhea or shortness of breath. In the emergency department the patient continued to have episodes of coffee-ground emesis.  She had at least 8 episodes while in the emergency department.  Her hemoglobin was found to be 10.  Her BUN was slightly elevated at 21.  White count was 15.3.  Platelet count 417.  CT showed thickened small bowel loops along the region distal to the gastric surgical anastomosis c/w enteritis.  Her vitals are stable with a temperature of 98.5, a heart rate of 86, and blood pressure of 128/82.  A GI consultation was called by the ED provider.  She will be admitted to a progressive bed.  Since her vitals are stable and her hemoglobin also is stable, they will evaluate the patient in the AM. The patient's last bowel movement was 4 days ago.  She takes 2  medications for her constipation which usually work for her but she has been vomiting everything up.   Review of Systems: As mentioned in the history of present illness. All other systems reviewed and are negative. Past Medical History:  Diagnosis Date   Anal fissure - posterior 10/16/2014    OCC  ISSUES   Anxiety    doesn't take anything   Asthma    has Albuterol  inhaler as needed   Bronchitis    in winter   Chronic headache disorder 07/22/2016   Clostridium difficile infection 04/20/2012   Dehydration    Depression    Family history of adverse reaction to anesthesia    pt mom gets sick   GERD (gastroesophageal reflux disease)    takes Pantoprazole  daily   Headache    Hiatal hernia    neuropathy - mild in arms and legs   History of blood transfusion    last transfusion was 04/04/2016=Benadryl  was given d/t itching. States she always itches with transfusion.    History of migraine    last one 05/01/16   History of stomach ulcers    History of urinary tract infection LAST 2 WEEKS AGO   IDA (iron  deficiency anemia)    Internal and external bleeding hemorrhoids 06/11/2014   Joint pain    Joint swelling    Left sided chronic colitis - segmental 06/11/2014   Marginal ulcer 10/25/2017   Motion sickness    Nausea    takes Zofran   as needed   Nausea and vomiting    for 1 year   Oligouria    Osteoarthritis    lower back, knees, wrists - no meds   Peripheral neuropathy 03/01/2019   Personal history of gastric bypass    Postoperative nausea and vomiting 01/21/2016   wants scopolamine  patch   Right carpal tunnel syndrome 03/01/2019   SVD (spontaneous vaginal delivery)    x 4   TOBACCO USER 10/02/2009   Qualifier: Diagnosis of  By: Lida MD, Artist     Tremor 08/31/2018   UC (ulcerative colitis) (HCC)    supposed to be taking Lialda  and Bentyl  but has been off since gastric sleeve   Unsteady gait when walking 2024   uses a walker   Vertigo    doesn't take any meds   Past  Surgical History:  Procedure Laterality Date   ABDOMINAL HYSTERECTOMY     PARTIAL   BIOPSY  01/20/2023   Procedure: BIOPSY;  Surgeon: Saintclair Jasper, MD;  Location: [facility];  Service: Gastroenterology;;   BLADDER STIMULATOR IMPLANT     COLONOSCOPY  2007   for rectal bleeding; [facility]   COLONOSCOPY N/A 06/11/2014   Procedure: COLONOSCOPY;  Surgeon: Lupita FORBES Commander, MD;  [facility]: [facility];  Service: Endoscopy;  Laterality: N/A;   COLONOSCOPY WITH PROPOFOL  N/A 03/02/2017   Procedure: COLONOSCOPY WITH PROPOFOL ;  Surgeon: Ethyl Lenis, MD;  Location: [facility];  Service: General;  Laterality: N/A;   DILATION AND CURETTAGE OF UTERUS     ESOPHAGOGASTRODUODENOSCOPY N/A 07/25/2023   Procedure: ESOPHAGOGASTRODUODENOSCOPY (EGD);  Surgeon: Kriss Estefana DEL, DO;  Location: [facility];  Service: Gastroenterology;  Laterality: N/A;   ESOPHAGOGASTRODUODENOSCOPY N/A 10/02/2023   Procedure: ESOPHAGOGASTRODUODENOSCOPY (EGD);  Surgeon: Burnette Fallow, MD;  Location: [facility];  Service: Gastroenterology;  Laterality: N/A;   ESOPHAGOGASTRODUODENOSCOPY N/A 08/11/2024   Procedure: EGD (ESOPHAGOGASTRODUODENOSCOPY);  Surgeon: Saintclair Jasper, MD;  Location: [facility];  Service: Gastroenterology;  Laterality: N/A;   ESOPHAGOGASTRODUODENOSCOPY (EGD) WITH PROPOFOL  N/A 04/06/2016   Procedure: ESOPHAGOGASTRODUODENOSCOPY (EGD) WITH PROPOFOL ;  Surgeon: Lenis Ethyl, MD;  [facility];  Service: General;  Laterality: N/A;   ESOPHAGOGASTRODUODENOSCOPY (EGD) WITH PROPOFOL  N/A 03/02/2017   Procedure: ESOPHAGOGASTRODUODENOSCOPY (EGD) WITH PROPOFOL ;  Surgeon: Ethyl Lenis, MD;  Location: [facility];  Service: General;  Laterality: N/A;   ESOPHAGOGASTRODUODENOSCOPY (EGD) WITH PROPOFOL  N/A 05/20/2017   Procedure: ESOPHAGOGASTRODUODENOSCOPY (EGD) WITH PROPOFOL  ERAS PATHWAY;  Surgeon: Ethyl Lenis, MD;  Location: [facility];  Service: General;  Laterality: N/A;   ESOPHAGOGASTRODUODENOSCOPY (EGD)  WITH PROPOFOL  N/A 10/07/2017   Procedure: ESOPHAGOGASTRODUODENOSCOPY (EGD) WITH PROPOFOL ;  Surgeon: Ethyl Lenis, MD;  Location: [facility];  Service: General;  Laterality: N/A;   ESOPHAGOGASTRODUODENOSCOPY (EGD) WITH PROPOFOL  N/A 01/20/2023   Procedure: ESOPHAGOGASTRODUODENOSCOPY (EGD) WITH PROPOFOL ;  Surgeon: Saintclair Jasper, MD;  [facility];  Service: Gastroenterology;  Laterality: N/A;   EXCISION OF SKIN TAG  06/08/2017   Procedure: EXCISION OF VULVAR SKIN TAGS X2;  Surgeon: Fredirick Glenys RAMAN, MD;  [facility]: [facility];  Service: Gynecology;;   FOOT SURGERY Bilateral    x 2   GASTRIC ROUX-EN-Y N/A 12/14/2016   Procedure: LAPAROSCOPIC REVISION SLEEVE GASTRECTOMY TO  ROUX-Y-GASTRIC BY-PASS, UPPER ENDO;  Surgeon: Morene Olives, MD;  [facility];  Service: General;  Laterality: N/A;   GASTROJEJUNOSTOMY N/A 05/11/2016   Procedure: LAPAROSCOPIC PLACEMENT  OF FEEDING  JEJUNOSTOMY TUBE;  Surgeon: Morene Olives, MD;  [facility]: [facility];  Service: General;  Laterality: N/A;   HEMORRHOIDECTOMY WITH HEMORRHOID BANDING  HEMOSTASIS CLIP PLACEMENT  07/25/2023   Procedure: HEMOSTASIS CLIP PLACEMENT;  Surgeon: Kriss Estefana DEL, DO;  [facility];  Service: Gastroenterology;;   HOT HEMOSTASIS N/A 07/25/2023   Procedure: HOT HEMOSTASIS (ARGON PLASMA COAGULATION/BICAP);  Surgeon: Kriss Estefana DEL, DO;  Location: [facility];  Service: Gastroenterology;  Laterality: N/A;   IR FLUORO GUIDE CV LINE RIGHT  04/06/2017   IR GASTROSTOMY TUBE MOD SED  08/25/2023   IR GENERIC HISTORICAL  05/19/2016   IR CM INJ ANY COLONIC TUBE W/FLUORO 05/19/2016 Marcey Moan, MD [facility]   IR PATIENT EVAL TECH 0-60 MINS  11/30/2017   IR REPLC DUODEN/JEJUNO TUBE PERCUT W/FLUORO  03/14/2018   IR REPLC DUODEN/JEJUNO TUBE PERCUT W/FLUORO  03/15/2018   IR US  GUIDE VASC ACCESS LEFT  08/25/2023   IR US  GUIDE VASC ACCESS LEFT  08/25/2023   IR US  GUIDE VASC ACCESS RIGHT  04/06/2017   j tube removed  march 2018     KNEE ARTHROSCOPY Left 09/06/2014   LAPAROSCOPIC GASTRIC SLEEVE RESECTION N/A 01/14/2016   Procedure: LAPAROSCOPIC GASTRIC SLEEVE RESECTION;  Surgeon: Morene Olives, MD;  [facility];  Service: General;  Laterality: N/A;   LAPAROSCOPIC INSERTION GASTROSTOMY TUBE N/A 09/14/2023   Procedure: LAPAROSCOPIC REMNANT GASTROSTOMY TUBE PLACEMENT;  Surgeon: Lyndel Deward PARAS, MD;  [facility];  Service: General;  Laterality: N/A;   LAPAROSCOPIC REVISION OF GASTROJEJUNOSTOMY N/A 10/25/2017   Procedure: LAPAROSCOPIC REVISION OF GASTROJEJUNOSTOMY AND PARTIAL GASTRECTOMY, WITH PLACEMENT OF FEEDING GASTROSTOMY TUBE;  Surgeon: Olives Morene, MD;  [facility];  Service: General;  Laterality: N/A;   LAPAROSCOPIC TUBAL LIGATION  10/16/2011   Procedure: LAPAROSCOPIC TUBAL LIGATION;  Surgeon: Nena DELENA App, MD;  [facility];  Service: Gynecology;  Laterality: Bilateral;   LAPAROSCOPY N/A 08/21/2024   Procedure: LAPAROSCOPY, DIAGNOSTIC, LAPAROSCOPIC GRAM PATCH OF ULCER;  Surgeon: Tanda Locus, MD;  [facility];  Service: General;  Laterality: N/A;  DIAGNOSTIC LAPAROSCOPY   NOVASURE ABLATION  09/28/2010   mild persistent vaginal bleeding   right knee arthroscopy     05/07/2016   TOTAL KNEE ARTHROPLASTY Left 03/10/2018   Procedure: LEFT TOTAL KNEE ARTHROPLASTY;  Surgeon: Ernie Cough, MD;  [facility];  Service: Orthopedics;  Laterality: Left;  70 mins   TOTAL KNEE ARTHROPLASTY Right 01/23/2020   Procedure: TOTAL KNEE ARTHROPLASTY, BILATERAL TROCHANTERIC INJECTION;  Surgeon: Ernie Cough, MD;  [facility];  Service: Orthopedics;  Laterality: Right;  70 mins for Bilateral Troch injection 1 cc Depo and 2 CC Lidocaine    VAGINAL HYSTERECTOMY Bilateral 06/08/2017   Procedure: HYSTERECTOMY VAGINAL W/ BILATERAL SALPINGECTOMY;  Surgeon: Fredirick Glenys RAMAN, MD;  Location: [facility];  Service: Gynecology;  Laterality: Bilateral;   wisdom teeth extracted     Social  History:  reports that she quit smoking about 10 years ago. Her smoking use included cigarettes. She started smoking about 30 years ago. She has a 10 pack-year smoking history. She has never used smokeless tobacco. She reports that she does not currently use alcohol. She reports that she does not use drugs.  Allergies[1]  Family History  Problem Relation Age of Onset   Hypertension Mother    Diabetes Mother    Sarcoidosis Mother        lungs and skin   Asthma Mother    Hypertension Father    Diabetes Father    Asthma Son    Tics Son    Asthma Sister    Cancer Sister  possible pancreatic cancer   Adrenal disorder Sister        Tumor    Asthma Brother    Asthma Daughter        died age 64.5   Cancer Daughter 4       brain; died age 3.5   Asthma Son    Cancer Paternal Aunt        brain, colon, lung and esophagus; unsure of primary    Breast cancer Paternal Aunt     Prior to Admission medications  Medication Sig Start Date End Date Taking? Authorizing Provider  ACCRUFER  30 MG CAPS Take 30 mg by mouth 2 (two) times daily.   Yes [provider]  albuterol  (VENTOLIN  HFA) 108 (90 Base) MCG/ACT inhaler Inhale 2 puffs into the lungs every 6 (six) hours as needed for wheezing or shortness of breath.   Yes [provider]  amitriptyline  (ELAVIL ) 25 MG tablet Take 50 mg by mouth at bedtime.   Yes [provider]  atorvastatin  (LIPITOR) 40 MG tablet Take 40 mg by mouth daily. 12/10/22  Yes [provider]  baclofen  (LIORESAL ) 10 MG tablet Take 10 mg by mouth 3 (three) times daily.   Yes [provider]  botulinum toxin Type A  (BOTOX ) 200 units injection INJECT 155 UNITS INTRAMUSCULARLY INTO HEAD AND NECK MUSCLES EVERY 3 MONTHS 11/23/22  Yes Onita Duos, MD  Buprenorphine  HCl 600 MCG FILM Place 600 mcg under the tongue in the morning and at bedtime.   Yes [provider]  buPROPion  (WELLBUTRIN  XL) 150 MG 24 hr tablet Take 150 mg by  mouth in the morning.   Yes [provider]  buPROPion  (WELLBUTRIN  XL) 300 MG 24 hr tablet Take 300 mg by mouth in the morning. 06/05/20  Yes [provider]  busPIRone  (BUSPAR ) 15 MG tablet Take 15 mg by mouth 3 (three) times daily.   Yes [provider]  Cholecalciferol (VITAMIN D3 PO) Take 1 capsule by mouth daily.   Yes [provider]  clonazePAM  (KLONOPIN ) 1 MG tablet Take 1 mg by mouth 2 (two) times daily as needed for anxiety.   Yes [provider]  cyanocobalamin  (,VITAMIN B-12,) 1000 MCG/ML injection Inject 1,000 mcg into the muscle 2 (two) times a week.  04/06/19  Yes [provider]  diclofenac  Sodium (VOLTAREN ) 1 % GEL Apply 2 g topically 4 (four) times daily as needed (Pain).   Yes [provider]  dicyclomine  (BENTYL ) 10 MG capsule Take 10 mg by mouth 4 (four) times daily -  before meals and at bedtime.   Yes [provider]  DULoxetine  (CYMBALTA ) 60 MG capsule Take 1 capsule (60 mg total) by mouth 2 (two) times daily. 04/10/19  Yes Wonda Clarita BRAVO, NP  EPINEPHrine  0.3 mg/0.3 mL IJ SOAJ injection Inject 0.3 mg into the muscle as needed for anaphylaxis. 04/12/21  Yes Eudelia Maude SAUNDERS, PA-C  famotidine  (PEPCID ) 20 MG tablet Take 20 mg by mouth 2 (two) times daily.   Yes [provider]  fluticasone  (FLONASE ) 50 MCG/ACT nasal spray Place 1-2 sprays into both nostrils daily as needed for allergies or rhinitis.   Yes [provider]  gabapentin  (NEURONTIN ) 600 MG tablet Take 900 mg by mouth 3 (three) times daily.   Yes [provider]  HYDROmorphone  (DILAUDID ) 4 MG tablet Take 4 mg by mouth every 4 (four) hours. 09/17/21  Yes [provider]  linaclotide  (LINZESS ) 290 MCG CAPS capsule Take 290 mcg by  mouth daily before breakfast.   Yes [provider]  mesalamine  (LIALDA ) 1.2 g EC tablet Take 2.4 g by mouth 2 (two) times daily.   Yes [provider]  methocarbamol   (ROBAXIN ) 500 MG tablet Take 2 tablets (1,000 mg total) by mouth every 6 (six) hours as needed (use for muscle cramps/pain). Patient taking differently: Take 1,000 mg by mouth every 6 (six) hours as needed (for muscle cramps/pain). 08/30/24  Yes Simaan, Almarie RAMAN, PA-C  midodrine  (PROAMATINE ) 10 MG tablet Take 1 tablet (10 mg total) by mouth 3 (three) times daily with meals. 08/17/24  Yes Odell Celinda Balo, MD  misoprostol  (CYTOTEC ) 200 MCG tablet Take 200 mcg by mouth daily.   Yes [provider]  Multiple Vitamin (MULTIVITAMIN WITH MINERALS) TABS tablet Take 1 tablet by mouth daily with breakfast.   Yes [provider]  NARCAN  4 MG/0.1ML LIQD nasal spray kit Place 1 spray into the nose daily as needed (overdose). 03/18/20  Yes [provider]  Nutritional Supplements (NUTREN 2.0) LIQD Take by mouth daily.   Yes [provider]  nystatin  (MYCOSTATIN ) 100000 UNIT/ML suspension Take 5 mLs by mouth See admin instructions. 5 ml's by mouth three times a day AS DIRECTED   Yes [provider]  omeprazole  (PRILOSEC ) 40 MG capsule Take 40 mg by mouth 2 (two) times daily before a meal.   Yes [provider]  ondansetron  (ZOFRAN -ODT) 8 MG disintegrating tablet Take 8 mg by mouth See admin instructions. Dissolve 8 mg in the mouth every eight hours 06/13/24  Yes [provider]  oxybutynin  (DITROPAN -XL) 5 MG 24 hr tablet Take 10 mg by mouth at bedtime. 08/16/24  Yes [provider]  PRESCRIPTION MEDICATION Take 400 mg by mouth See admin instructions. Magnesium  ES 400 mg SGC - Crush/dissolve 400 mg and take by mouth once a day   Yes [provider]  QULIPTA  30 MG TABS TAKE 1 TABLET BY MOUTH EVERY DAY Patient taking differently: Take 30 mg by mouth daily. 12/27/23  Yes Gayland Lauraine PARAS, NP  sucralfate  (CARAFATE ) 1 GM/10ML suspension Take 10 mLs (1 g total) by mouth 4 (four) times daily. 08/30/24 10/29/24 Yes SimaanAlmarie RAMAN, PA-C   SUMAtriptan  (IMITREX ) 50 MG tablet Take 50 mg by mouth daily as needed for migraine or headache (and may repeat once in 2 hours if headache persists or recurs).   Yes [provider]  SYMPROIC  0.2 MG TABS Take 0.2 mg by mouth daily before breakfast. 08/17/24  Yes [provider]  thiamine  (VITAMIN B-1) 100 MG tablet Take 100 mg by mouth daily at 12 noon.    Yes [provider]  torsemide  (DEMADEX ) 5 MG tablet Take 5 mg by mouth daily.   Yes [provider]  Vitamin D , Ergocalciferol , (DRISDOL ) 1.25 MG (50000 UNIT) CAPS capsule Take 50,000 Units by mouth every Monday. 08/16/24  Yes [provider]  Nutritional Supplements (FEEDING SUPPLEMENT, OSMOLITE 1.5 CAL,) LIQD Place 1,000 mLs into feeding tube daily. Patient not taking: Reported on 10/02/2024 08/30/24   Augustus Almarie RAMAN, PA-C  Nystatin  (GERHARDT'S BUTT CREAM) CREA Apply 1 Application topically 3 (three) times daily. Patient not taking: Reported on 10/02/2024 08/17/24   Odell Celinda Balo, MD  pantoprazole  (PROTONIX ) 40 MG tablet Take 1 tablet (40 mg total) by mouth 2 (two) times daily before a meal. Crush and take twice daily Patient not taking: Reported on 10/02/2024 08/30/24 10/29/24  Augustus Almarie RAMAN, PA-C  valACYclovir  (VALTREX ) 1000 MG  tablet Take 1,000 mg by mouth 2 (two) times daily. Patient not taking: Reported on 10/02/2024    [provider]    Physical Exam: Vitals:   10/02/24 1615 10/02/24 1853 10/02/24 1900 10/02/24 2230  BP: 127/81 111/80 108/72 125/79  Pulse: 91 (!) 102 (!) 104 91  Resp: 16 18 20 13   Temp: 97.9 F (36.6 C) 98 F (36.7 C)  98.5 F (36.9 C)  TempSrc: Oral Oral    SpO2: 100% 100% 100% 100%   Physical Exam:  General: No acute distress, severe protein calorie malnutrition HEENT: Normocephalic, atraumatic, PERRL Cardiovascular: Normal rate and rhythm. Distal pulses intact. Pulmonary: Normal pulmonary effort, normal breath sounds Gastrointestinal:  Nondistended abdomen, soft, tender diffusely, hypoactive bowel sounds Musculoskeletal:Normal ROM, no lower ext edema, Prominent shoulder bones with no fat.  Lymphadenopathy: No cervical LAD. Skin: Skin is warm and dry. Neuro: No focal deficits noted, AAOx3. PSYCH: Attentive and cooperative  Data Reviewed:  Results for orders placed or performed during the hospital encounter of 10/02/24 (from the past 24 hours)  Type and screen [facility]     Status: None   Collection Time: 10/02/24  4:30 PM  Result Value Ref Range   ABO/RH(D) O POS    Antibody Screen NEG    Sample Expiration      10/05/2024,2359 Performed at [facility], [address], [place], [place] [address]   CBC with Differential     Status: Abnormal   Collection Time: 10/02/24  4:32 PM  Result Value Ref Range   WBC 15.3 (H) 4.0 - 10.5 K/uL   RBC 3.69 (L) 3.87 - 5.11 MIL/uL   Hemoglobin 10.6 (L) 12.0 - 15.0 g/dL   HCT 65.3 (L) 63.9 - 53.9 %   MCV 93.8 80.0 - 100.0 fL   MCH 28.7 26.0 - 34.0 pg   MCHC 30.6 30.0 - 36.0 g/dL   RDW 83.5 (H) 88.4 - 84.4 %   Platelets 417 (H) 150 - 400 K/uL   nRBC 0.0 0.0 - 0.2 %   Neutrophils Relative % 83 %   Neutro Abs 12.7 (H) 1.7 - 7.7 K/uL   Lymphocytes Relative 11 %   Lymphs Abs 1.6 0.7 - 4.0 K/uL   Monocytes Relative 5 %   Monocytes Absolute 0.8 0.1 - 1.0 K/uL   Eosinophils Relative 1 %   Eosinophils Absolute 0.1 0.0 - 0.5 K/uL   Basophils Relative 0 %   Basophils Absolute 0.0 0.0 - 0.1 K/uL   Immature Granulocytes 0 %   Abs Immature Granulocytes 0.04 0.00 - 0.07 K/uL  Protime-INR     Status: None   Collection Time: 10/02/24  4:32 PM  Result Value Ref Range   Prothrombin Time 13.4 11.4 - 15.2 seconds   INR 1.0 0.8 - 1.2  I-Stat CG4 Lactic Acid     Status: None   Collection Time: 10/02/24  4:48 PM  Result Value Ref Range   Lactic Acid, Venous 1.4 0.5 - 1.9 mmol/L  I-stat chem 8, ED     Status: Abnormal   Collection Time: 10/02/24   4:48 PM  Result Value Ref Range   Sodium 137 135 - 145 mmol/L   Potassium 4.3 3.5 - 5.1 mmol/L   Chloride 100 98 - 111 mmol/L   BUN 21 (H) 6 - 20 mg/dL   Creatinine, Ser 9.39 0.44 - 1.00 mg/dL   Glucose, Bld 868 (H) 70 - 99 mg/dL   Calcium , Ion 1.18 1.15 -  1.40 mmol/L   TCO2 29 22 - 32 mmol/L   Hemoglobin 12.2 12.0 - 15.0 g/dL   HCT 63.9 63.9 - 53.9 %  Comprehensive metabolic panel with GFR     Status: Abnormal   Collection Time: 10/02/24  6:30 PM  Result Value Ref Range   Sodium 138 135 - 145 mmol/L   Potassium 3.7 3.5 - 5.1 mmol/L   Chloride 104 98 - 111 mmol/L   CO2 27 22 - 32 mmol/L   Glucose, Bld 195 (H) 70 - 99 mg/dL   BUN 19 6 - 20 mg/dL   Creatinine, Ser 9.52 0.44 - 1.00 mg/dL   Calcium  8.9 8.9 - 10.3 mg/dL   Total Protein 6.1 (L) 6.5 - 8.1 g/dL   Albumin  3.2 (L) 3.5 - 5.0 g/dL   AST 13 (L) 15 - 41 U/L   ALT 7 0 - 44 U/L   Alkaline Phosphatase 77 38 - 126 U/L   Total Bilirubin <0.2 0.0 - 1.2 mg/dL   GFR, Estimated >39 >39 mL/min   Anion gap 8 5 - 15  I-Stat CG4 Lactic Acid     Status: None   Collection Time: 10/02/24  6:42 PM  Result Value Ref Range   Lactic Acid, Venous 1.0 0.5 - 1.9 mmol/L  I-stat chem 8, ed     Status: Abnormal   Collection Time: 10/02/24  6:45 PM  Result Value Ref Range   Sodium 137 135 - 145 mmol/L   Potassium 3.7 3.5 - 5.1 mmol/L   Chloride 101 98 - 111 mmol/L   BUN 21 (H) 6 - 20 mg/dL   Creatinine, Ser 9.49 0.44 - 1.00 mg/dL   Glucose, Bld 820 (H) 70 - 99 mg/dL   Calcium , Ion 1.19 1.15 - 1.40 mmol/L   TCO2 29 22 - 32 mmol/L   Hemoglobin 10.5 (L) 12.0 - 15.0 g/dL   HCT 68.9 (L) 63.9 - 53.9 %     Assessment and Plan: Coffee-ground emesis - [facility] consulted - Nothing by mouth but she does have her tube feeds continually going which go into excluded stomach pouch. - Monitor hemoglobin - IV Protonix  - IV fluids - Continue Misoprostol   2. Intractable abdominal pain and nausea/ Enteritis - Continue Mesalamine   - Symptomatic  care - Will change her scheduled Dilaudid  to 1mg  IV instead of 4mg  p.o. She is on buprenorphine  twice daily in addition to Dilaudid  which her son will have to bring in. - She has chronic constipation and is on Linzess  daily and Symproic .  Her CT shows quite a bit of stool but she has not been able to keep her medication down.  Resume Linzess  and Symproic   3. Five weeks s/p surgical repair of perforated stomach ulcer - consult surgical team in a.m.    Advance Care Planning:   Code Status: Full Code  The patient names her son as her surrogate decision maker and she will be full code.  Consults: [facility]  Family Communication: None  Severity of Illness: The appropriate patient status for this patient is INPATIENT. Inpatient status is judged to be reasonable and necessary in order to provide the required intensity of service to ensure the patient's safety. The patient's presenting symptoms, physical exam findings, and initial radiographic and laboratory data in the context of their chronic comorbidities is felt to place them at high risk for further clinical deterioration. Furthermore, it is not anticipated that the patient will be medically stable for discharge from the hospital within  2 midnights of admission.   * I certify that at the point of admission it is my clinical judgment that the patient will require inpatient hospital care spanning beyond 2 midnights from the point of admission due to high intensity of service, high risk for further deterioration and high frequency of surveillance required.*  Author: ARTHEA CHILD, MD 10/02/2024 10:52 PM  For on call review [email or web address].      [1]  Allergies Allergen Reactions   Ciprofloxacin  Itching, Dermatitis and Rash   Coconut (Cocos Nucifera) Anaphylaxis and Itching   Coconut Oil Anaphylaxis and Itching   Doxycycline Nausea And Vomiting   Oxycodone Anaphylaxis   Trazodone  And Nefazodone Rash   Hydroxyzine  Hives and Rash    Nefazodone Rash   Penicillins Hives, Rash and Other (See Comments)    Has patient had a PCN reaction causing immediate rash, facial/tongue/throat swelling, SOB or lightheadedness with hypotension: Yes   Silicone Rash   Tape Rash and Other (See Comments)    And, paper tape causes a rash if worn for a prolong period of time   Trazodone  Rash   Wound Dressing Adhesive Rash   "

## 2024-10-02 NOTE — ED Notes (Signed)
 Care delay: IV infiltrated, bolus stopped and unable to obtain further labs at this time. Peers consulted for help starting another IV

## 2024-10-02 NOTE — Progress Notes (Signed)
-  can we get this patient scheduled to see me in 2-29months?   LVM for patient to return call for scheduling with Dr Lucille

## 2024-10-02 NOTE — ED Provider Notes (Signed)
 " [place] [facility] AT [facility] Provider Note   CSN: [id number] Arrival date & time: 10/02/24  1605     Patient presents with: Hematemesis   [name] is a 49 y.o. female.  Patient is a 49 year old female with a history of chronic abdominal pain, feeding tube in place, previous gastric sleeve surgery, and previous perforated ulcer who presents to the ED for HI and episodes of vomiting dark blood as well as worsening abdominal pain that began last evening.  Notes she has not been able to eat or drink today.  States she is continually dry heaving and is still having episodes of vomiting with blood.  She is not on blood thinners.  Notes she does have a history of chronic anemia and is on iron  infusions.  States she had to have surgery with GI in November for a bleeding ulcer.  States she is feeling fatigued and weak.  She is on chronic Dilaudid  for abdominal pain but has not been able to keep this down secondary to vomiting.  Denies fevers, headache, chest pain, shortness of breath, diarrhea/constipation, hematochezia.   HPI     Prior to Admission medications  Medication Sig Start Date End Date Taking? Authorizing Provider  ACCRUFER  30 MG CAPS Take 30 mg by mouth 2 (two) times daily.   Yes [provider]  albuterol  (VENTOLIN  HFA) 108 (90 Base) MCG/ACT inhaler Inhale 2 puffs into the lungs every 6 (six) hours as needed for wheezing or shortness of breath.   Yes [provider]  amitriptyline  (ELAVIL ) 25 MG tablet Take 50 mg by mouth at bedtime.   Yes [provider]  atorvastatin  (LIPITOR) 40 MG tablet Take 40 mg by mouth daily. 12/10/22  Yes [provider]  baclofen  (LIORESAL ) 10 MG tablet Take 10 mg by mouth 3 (three) times daily.   Yes [provider]  Buprenorphine  HCl 600 MCG FILM Place 600 mcg under the tongue in the morning and at bedtime.   Yes [provider]  buPROPion  (WELLBUTRIN  XL) 150  MG 24 hr tablet Take 150 mg by mouth in the morning.   Yes [provider]  buPROPion  (WELLBUTRIN  XL) 300 MG 24 hr tablet Take 300 mg by mouth in the morning. Take along with 150 mg=450 mg 06/05/20  Yes [provider]  busPIRone  (BUSPAR ) 15 MG tablet Take 15 mg by mouth 3 (three) times daily.   Yes [provider]  clonazePAM  (KLONOPIN ) 1 MG tablet Take 1 mg by mouth 2 (two) times daily as needed for anxiety.   Yes [provider]  dicyclomine  (BENTYL ) 10 MG capsule Take 10 mg by mouth 4 (four) times daily -  before meals and at bedtime.   Yes [provider]  DULoxetine  (CYMBALTA ) 60 MG capsule Take 1 capsule (60 mg total) by mouth 2 (two) times daily. 04/10/19  Yes Wonda Clarita BRAVO, NP  famotidine  (PEPCID ) 20 MG tablet Take 20 mg by mouth 2 (two) times daily.   Yes [provider]  gabapentin  (NEURONTIN ) 600 MG tablet Take 600 mg by mouth 3 (three) times daily.   Yes [provider]  QULIPTA  30 MG TABS TAKE 1 TABLET BY MOUTH EVERY DAY 12/27/23  Yes Gayland Lauraine PARAS, NP  Vitamin D , Ergocalciferol , (DRISDOL ) 1.25 MG (50000 UNIT) CAPS capsule Take 50,000 Units by mouth every Monday. 08/16/24  Yes [provider]  botulinum toxin Type A  (BOTOX ) 200 units injection INJECT 155 UNITS INTRAMUSCULARLY INTO  HEAD AND NECK MUSCLES EVERY 3 MONTHS 11/23/22   Onita Duos, MD  Cholecalciferol (VITAMIN D3) 1.25 MG (50000 UT) CAPS Take 50,000 Units by mouth every 7 (seven) days.  04/03/19   [provider]  cyanocobalamin  (,VITAMIN B-12,) 1000 MCG/ML injection Inject 1,000 mcg into the muscle 2 (two) times a week.  04/06/19   [provider]  diclofenac  Sodium (VOLTAREN ) 1 % GEL Apply 2 g topically 4 (four) times daily as needed (Pain).    [provider]  EPINEPHrine  0.3 mg/0.3 mL IJ SOAJ injection Inject 0.3 mg into the muscle as needed for anaphylaxis. 04/12/21   Eudelia Maude SAUNDERS, PA-C  HYDROmorphone  (DILAUDID ) 4 MG tablet  Take 4 mg by mouth every 6 (six) hours as needed for moderate pain (pain score 4-6) or severe pain (pain score 7-10). 09/17/21   [provider]  linaclotide  (LINZESS ) 290 MCG CAPS capsule Take 290 mcg by mouth daily before breakfast.    [provider]  mesalamine  (LIALDA ) 1.2 g EC tablet Take 2.4 g by mouth 2 (two) times daily.    [provider]  methocarbamol  (ROBAXIN ) 500 MG tablet Take 2 tablets (1,000 mg total) by mouth every 6 (six) hours as needed (use for muscle cramps/pain). 08/30/24   Augustus Almarie RAMAN, PA-C  midodrine  (PROAMATINE ) 10 MG tablet Take 1 tablet (10 mg total) by mouth 3 (three) times daily with meals. 08/17/24   Odell Celinda Balo, MD  Multiple Vitamin (MULTIVITAMIN WITH MINERALS) TABS tablet Take 1 tablet by mouth daily.    [provider]  NARCAN  4 MG/0.1ML LIQD nasal spray kit Place 1 spray into the nose daily as needed (overdose). 03/18/20   [provider]  Nutritional Supplements (FEEDING SUPPLEMENT, OSMOLITE 1.5 CAL,) LIQD Place 1,000 mLs into feeding tube daily. 08/30/24   Augustus Almarie RAMAN, PA-C  Nystatin  (GERHARDT'S BUTT CREAM) CREA Apply 1 Application topically 3 (three) times daily. 08/17/24   Odell Celinda Balo, MD  ondansetron  (ZOFRAN -ODT) 8 MG disintegrating tablet Take 8 mg by mouth every 8 (eight) hours as needed for nausea, vomiting or refractory nausea / vomiting. 06/13/24   [provider]  oxybutynin  (DITROPAN -XL) 5 MG 24 hr tablet Take 5 mg by mouth daily. 08/16/24   [provider]  pantoprazole  (PROTONIX ) 40 MG tablet Take 1 tablet (40 mg total) by mouth 2 (two) times daily before a meal. Crush and take twice daily 08/30/24 10/29/24  Augustus Almarie RAMAN, PA-C  sucralfate  (CARAFATE ) 1 GM/10ML suspension Take 10 mLs (1 g total) by mouth 4 (four) times daily. 08/30/24 10/29/24  Augustus Almarie RAMAN, PA-C  SYMPROIC  0.2 MG TABS Take 1 tablet by mouth daily. 08/17/24   [provider]   thiamine  (VITAMIN B-1) 100 MG tablet Take 100 mg by mouth daily at 12 noon.     [provider]  valACYclovir  (VALTREX ) 1000 MG tablet Take 1,000 mg by mouth 2 (two) times daily.    [provider]    Allergies: Ciprofloxacin , Coconut (cocos nucifera), Coconut oil, Doxycycline, Oxycodone, Penicillamine, Trazodone  and nefazodone, Hydroxyzine , Nefazodone, Penicillins, Silicone, Tape, Trazodone , and Wound dressing adhesive    Review of Systems  Constitutional:  Negative for fever.  Respiratory:  Negative for shortness of breath.   Cardiovascular:  Negative for chest pain.  Gastrointestinal:  Positive for abdominal pain, nausea and vomiting. Negative for constipation and diarrhea.  Neurological:  Positive for weakness. Negative for syncope.  All other systems reviewed and are negative.   Updated Vital Signs  BP 108/72   Pulse (!) 104   Temp 98 F (36.7 C) (Oral)   Resp 20   LMP 06/02/2017 Comment: hysterectomy 06/08/2017  SpO2 100%   Physical Exam Constitutional:      Comments: Uncomfortable in the bed, dry heaving  HENT:     Head: Normocephalic and atraumatic.     Mouth/Throat:     Mouth: Mucous membranes are moist.     Pharynx: Oropharynx is clear.  Cardiovascular:     Rate and Rhythm: Normal rate.  Pulmonary:     Effort: Pulmonary effort is normal.  Abdominal:     Palpations: Abdomen is soft.     Comments: Central feeding tube present.  Epigastric tenderness appreciated.  Actively dry heaving and had an episode of hematemesis that was dark brown in color  Musculoskeletal:        General: Normal range of motion.  Skin:    General: Skin is warm and dry.  Neurological:     Mental Status: She is alert and oriented to person, place, and time.  Psychiatric:        Mood and Affect: Mood normal.        Behavior: Behavior normal.     (all labs ordered are listed, but only abnormal results are displayed) Labs Reviewed  CBC WITH DIFFERENTIAL/PLATELET -  Abnormal; Notable for the following components:      Result Value   WBC 15.3 (*)    RBC 3.69 (*)    Hemoglobin 10.6 (*)    HCT 34.6 (*)    RDW 16.4 (*)    Platelets 417 (*)    Neutro Abs 12.7 (*)    All other components within normal limits  COMPREHENSIVE METABOLIC PANEL WITH GFR - Abnormal; Notable for the following components:   Glucose, Bld 195 (*)    Total Protein 6.1 (*)    Albumin  3.2 (*)    AST 13 (*)    All other components within normal limits  I-STAT CHEM 8, ED - Abnormal; Notable for the following components:   BUN 21 (*)    Glucose, Bld 131 (*)    All other components within normal limits  I-STAT CHEM 8, ED - Abnormal; Notable for the following components:   BUN 21 (*)    Glucose, Bld 179 (*)    Hemoglobin 10.5 (*)    HCT 31.0 (*)    All other components within normal limits  PROTIME-INR  I-STAT CG4 LACTIC ACID, ED  I-STAT CG4 LACTIC ACID, ED  TYPE AND SCREEN    EKG: EKG Interpretation Date/Time:  Monday October 02 2024 16:50:42 EST Ventricular Rate:  97 PR Interval:  138 QRS Duration:  82 QT Interval:  338 QTC Calculation: 430 R Axis:   53  Text Interpretation: Sinus rhythm Confirmed by Cottie Cough [phone]) on 10/02/2024 4:53:16 PM  Radiology: CT ABDOMEN PELVIS W CONTRAST Result Date: 10/02/2024 CLINICAL DATA:  Hematemesis with fatigue and weakness. EXAM: CT ABDOMEN AND PELVIS WITH CONTRAST TECHNIQUE: Multidetector CT imaging of the abdomen and pelvis was performed using the standard protocol following bolus administration of intravenous contrast. RADIATION DOSE REDUCTION: This exam was performed according to the departmental dose-optimization program which includes automated exposure control, adjustment of the mA and/or kV according to patient size and/or use of iterative reconstruction technique. CONTRAST:  OMNIPAQUE  IOHEXOL  300 MG/ML  SOLN COMPARISON:  September 23, 2024 FINDINGS: Lower chest: No acute abnormality. Hepatobiliary: There is diffuse  fatty infiltration of the liver parenchyma. No  focal liver abnormality is seen. Status post cholecystectomy. No biliary dilatation. Pancreas: Unremarkable. No pancreatic ductal dilatation or surrounding inflammatory changes. Spleen: A 2.5 cm well-defined area of parenchymal low attenuation is seen within the anterior aspect of an otherwise normal-appearing spleen. Adrenals/Urinary Tract: Adrenal glands are unremarkable. Kidneys are normal, without renal calculi, focal lesion, or hydronephrosis. Urinary bladder is poorly distended and is otherwise unremarkable. Stomach/Bowel: Surgical sutures are seen throughout the gastric region with surgically anastomosed bowel noted within the mid left abdomen. A percutaneous gastrostomy tube is in place with surgical sutures seen adjacent to the site of insufflator bulb insertion. Appendix appears normal. No evidence of bowel dilatation. Moderate to markedly thickened, poorly distended small bowel loops are seen along the region distal to the gastric surgical anastomosis. Vascular/Lymphatic: No significant vascular findings are present. No enlarged abdominal or pelvic lymph nodes. Reproductive: Status post hysterectomy. No adnexal masses. Other: No abdominal wall hernia or abnormality. No abdominopelvic ascites. Musculoskeletal: No acute or significant osseous findings. IMPRESSION: 1. Moderate to markedly thickened, poorly distended small bowel loops along the region distal to the gastric surgical anastomosis, which may represent sequelae associated with enteritis. 2. Hepatic steatosis. 3. Evidence of prior cholecystectomy and hysterectomy. 4. Percutaneous gastrostomy tube in place. Electronically Signed   By: Suzen Dials M.D.   On: 10/02/2024 19:32      Medications Ordered in the ED  HYDROmorphone  (DILAUDID ) injection 1 mg (has no administration in time range)  lactated ringers  bolus 1,000 mL (1,000 mLs Intravenous New Bag/Given 10/02/24 1709)  pantoprazole   (PROTONIX ) injection 80 mg (80 mg Intravenous Given 10/02/24 1700)  HYDROmorphone  (DILAUDID ) injection 1 mg (1 mg Intravenous Given 10/02/24 1659)  ondansetron  (ZOFRAN ) injection 4 mg (4 mg Intravenous Given 10/02/24 1657)  iohexol  (OMNIPAQUE ) 300 MG/ML solution 100 mL (100 mLs Intravenous Contrast Given 10/02/24 1907)  fentaNYL  (SUBLIMAZE ) injection 50 mcg (50 mcg Intravenous Given 10/02/24 1943)    Clinical Course as of 10/02/24 2131  Mon Oct 02, 2024  1721 Hemoglobin(!): 10.6 Stable compared to baseline [AY]  1724 WBC(!): 15.3 [AY]    Clinical Course User Index [AY] Neysa Thersia RAMAN, PA-C                                Medical Decision Making Patient is a 49 year old female with a history of chronic abdominal pain, feeding tube in place, previous gastric sleeve surgery with anastomosis complications, and perforated ulcer who presents to the ED for hematemesis and weakness that began yesterday.  Please see detailed HPI above.  On exam patient is alert and uncomfortable lying in the bed.  She does have a feeding tube in place in the abdomen.  She also had an episode of coffee-ground emesis while being evaluated.  Notes she has had approximately 8 episodes of hematemesis last evening and never been approximately 5-6 more while in ED.  Lab workup shows stable hemoglobin of 10.5.  Lactic acid unremarkable.  Mild leukocytosis of 15.3 noted.  CT abdomen pelvis obtained that shows concerns for enteritis but no acute perforations or complications with the anastomosis today.  Of note patient was admitted in November for perforated ulcer as well as anastomosis complications.  She did see [facility] while admitted at that time.  Dr. Rosalie on-call with [facility] was consulted today.  He did agree with admission and they will consult tomorrow.  A message was sent to him as well.  As hemoglobin  was stable, he did not have any acute concerns.  He agreed with Protonix  and fluids.  Patient has been given Dilaudid  and  fentanyl  while in ED for pain.  Hospitalist has been consulted and are agreeable to admit patient for further evaluation of acute hematemesis.  Patient stable while in ED.   Amount and/or Complexity of Data Reviewed Labs: ordered. Decision-making details documented in ED Course. Radiology: ordered.  Risk Prescription drug management. Decision regarding hospitalization.       Final diagnoses:  Hematemesis with nausea  Enteritis    ED Discharge Orders     None          Neysa Thersia GORMAN DEVONNA 10/02/24 2133    Melvenia Motto, MD 10/03/24 0028  "

## 2024-10-02 NOTE — ED Notes (Signed)
 Pt in CT.

## 2024-10-02 NOTE — ED Triage Notes (Signed)
 Patient reports hematemesis since last night, hx of bleeding ulcer which she had surgery for in November, patient weak/fatigued/pallor. Patient is alert and oriented x 4. Airway patent, respirations even and unlabored.

## 2024-10-03 ENCOUNTER — Other Ambulatory Visit: Payer: Self-pay

## 2024-10-03 ENCOUNTER — Encounter (HOSPITAL_COMMUNITY): Payer: Self-pay | Admitting: Internal Medicine

## 2024-10-03 ENCOUNTER — Encounter (HOSPITAL_COMMUNITY)
Admission: EM | Disposition: A | Discharge status: Home Health | Payer: Self-pay | Source: Home / Self Care | Attending: Internal Medicine

## 2024-10-03 ENCOUNTER — Inpatient Hospital Stay (HOSPITAL_COMMUNITY)

## 2024-10-03 DIAGNOSIS — K259 Gastric ulcer, unspecified as acute or chronic, without hemorrhage or perforation: Secondary | ICD-10-CM

## 2024-10-03 DIAGNOSIS — J45909 Unspecified asthma, uncomplicated: Secondary | ICD-10-CM | POA: Diagnosis not present

## 2024-10-03 HISTORY — PX: ESOPHAGOGASTRODUODENOSCOPY: SHX5428

## 2024-10-03 LAB — BASIC METABOLIC PANEL WITH GFR
Anion gap: 9 (ref 5–15)
BUN: 9 mg/dL (ref 6–20)
CO2: 25 mmol/L (ref 22–32)
Calcium: 9.3 mg/dL (ref 8.9–10.3)
Chloride: 100 mmol/L (ref 98–111)
Creatinine, Ser: 0.52 mg/dL (ref 0.44–1.00)
GFR, Estimated: >60 mL/min
Glucose, Bld: 121 mg/dL — ABNORMAL HIGH (ref 70–99)
Potassium: 4.1 mmol/L (ref 3.5–5.1)
Sodium: 133 mmol/L — ABNORMAL LOW (ref 135–145)

## 2024-10-03 LAB — CBC
HCT: 27.6 % — ABNORMAL LOW (ref 36.0–46.0)
Hemoglobin: 8.7 g/dL — ABNORMAL LOW (ref 12.0–15.0)
MCH: 29.4 pg (ref 26.0–34.0)
MCHC: 31.5 g/dL (ref 30.0–36.0)
MCV: 93.2 fL (ref 80.0–100.0)
Platelets: 396 K/uL (ref 150–400)
RBC: 2.96 MIL/uL — ABNORMAL LOW (ref 3.87–5.11)
RDW: 16.6 % — ABNORMAL HIGH (ref 11.5–15.5)
WBC: 9 K/uL (ref 4.0–10.5)
nRBC: 0 % (ref 0.0–0.2)

## 2024-10-03 LAB — GLUCOSE, CAPILLARY
Glucose-Capillary: 108 mg/dL — ABNORMAL HIGH (ref 70–99)
Glucose-Capillary: 114 mg/dL — ABNORMAL HIGH (ref 70–99)
Glucose-Capillary: 121 mg/dL — ABNORMAL HIGH (ref 70–99)

## 2024-10-03 MED ORDER — FENTANYL CITRATE (PF) 100 MCG/2ML IJ SOLN
INTRAMUSCULAR | Status: DC | PRN
  Administered 2024-10-03 (×2): 50 ug via INTRAVENOUS

## 2024-10-03 MED ORDER — FERROUS SULFATE 325 (65 FE) MG PO TABS
325.0000 mg | ORAL_TABLET | Freq: Every day | ORAL | Status: DC
  Administered 2024-10-04 – 2024-10-15 (×12): 325 mg via ORAL
  Filled 2024-10-03 (×11): qty 1

## 2024-10-03 MED ORDER — PROPOFOL 10 MG/ML IV BOLUS
INTRAVENOUS | Status: DC | PRN
  Administered 2024-10-03: 100 mg via INTRAVENOUS

## 2024-10-03 MED ORDER — DEXAMETHASONE SOD PHOSPHATE PF 10 MG/ML IJ SOLN
INTRAMUSCULAR | Status: DC | PRN
  Administered 2024-10-03: 5 mg via INTRAVENOUS

## 2024-10-03 MED ORDER — SUCRALFATE 1 GM/10ML PO SUSP
1.0000 g | Freq: Three times a day (TID) | ORAL | Status: DC
  Administered 2024-10-03 – 2024-10-15 (×49): 1 g via ORAL
  Filled 2024-10-03 (×49): qty 10

## 2024-10-03 MED ORDER — BUSPIRONE HCL 5 MG PO TABS
15.0000 mg | ORAL_TABLET | Freq: Three times a day (TID) | ORAL | Status: DC
  Administered 2024-10-03 – 2024-10-15 (×36): 15 mg via ORAL
  Filled 2024-10-03 (×36): qty 3

## 2024-10-03 MED ORDER — SODIUM CHLORIDE 0.9 % IV SOLN: INTRAVENOUS | Status: DC

## 2024-10-03 MED ORDER — AMITRIPTYLINE HCL 25 MG PO TABS
50.0000 mg | ORAL_TABLET | Freq: Every day | ORAL | Status: DC
  Administered 2024-10-03 – 2024-10-14 (×12): 50 mg via ORAL
  Filled 2024-10-03 (×12): qty 2

## 2024-10-03 MED ORDER — LIDOCAINE 2% (20 MG/ML) 5 ML SYRINGE
INTRAMUSCULAR | Status: DC | PRN
  Administered 2024-10-03: 60 mg via INTRAVENOUS

## 2024-10-03 MED ORDER — FERROUS SULFATE 325 (65 FE) MG PO TBEC
325.0000 mg | DELAYED_RELEASE_TABLET | Freq: Two times a day (BID) | ORAL | Status: DC
  Filled 2024-10-03: qty 1

## 2024-10-03 MED ORDER — ORAL CARE MOUTH RINSE: 15.0000 mL | OROMUCOSAL | Status: DC | PRN

## 2024-10-03 MED ORDER — GABAPENTIN 300 MG PO CAPS
900.0000 mg | ORAL_CAPSULE | Freq: Three times a day (TID) | ORAL | Status: DC
  Administered 2024-10-03 – 2024-10-15 (×36): 900 mg via ORAL
  Filled 2024-10-03 (×21): qty 3
  Filled 2024-10-03: qty 9
  Filled 2024-10-03 (×7): qty 3
  Filled 2024-10-03: qty 9
  Filled 2024-10-03 (×6): qty 3

## 2024-10-03 MED ORDER — SUCCINYLCHOLINE CHLORIDE 200 MG/10ML IV SOSY
PREFILLED_SYRINGE | INTRAVENOUS | Status: DC | PRN
  Administered 2024-10-03: 80 mg via INTRAVENOUS

## 2024-10-03 MED ORDER — FERRIC MALTOL 30 MG PO CAPS: 30.0000 mg | ORAL_CAPSULE | Freq: Two times a day (BID) | ORAL | Status: DC

## 2024-10-03 MED ORDER — BUPROPION HCL ER (XL) 150 MG PO TB24
450.0000 mg | ORAL_TABLET | Freq: Every day | ORAL | Status: DC
  Administered 2024-10-04 – 2024-10-15 (×12): 450 mg via ORAL
  Filled 2024-10-03 (×12): qty 3

## 2024-10-03 MED ORDER — MENTHOL 3 MG MT LOZG
1.0000 | LOZENGE | OROMUCOSAL | Status: DC | PRN
  Administered 2024-10-13 (×2): 3 mg via ORAL
  Filled 2024-10-03 (×2): qty 9

## 2024-10-03 MED ORDER — OSMOLITE 1.5 CAL PO LIQD
1000.0000 mL | ORAL | Status: DC
  Administered 2024-10-03: 1000 mL
  Filled 2024-10-03: qty 1000

## 2024-10-03 MED ORDER — ATORVASTATIN CALCIUM 40 MG PO TABS
40.0000 mg | ORAL_TABLET | Freq: Every day | ORAL | Status: DC
  Administered 2024-10-03 – 2024-10-15 (×13): 40 mg via ORAL
  Filled 2024-10-03 (×13): qty 1

## 2024-10-03 MED ORDER — LIDOCAINE HCL (CARDIAC) PF 100 MG/5ML IV SOSY
PREFILLED_SYRINGE | INTRAVENOUS | Status: DC | PRN
  Administered 2024-10-03: 60 mg via INTRAVENOUS

## 2024-10-03 MED ORDER — FENTANYL CITRATE (PF) 100 MCG/2ML IJ SOLN
INTRAMUSCULAR | Status: AC
  Filled 2024-10-03: qty 2

## 2024-10-03 MED ORDER — OXYBUTYNIN CHLORIDE ER 5 MG PO TB24
10.0000 mg | ORAL_TABLET | Freq: Every day | ORAL | Status: DC
  Administered 2024-10-03 – 2024-10-14 (×12): 10 mg via ORAL
  Filled 2024-10-03 (×12): qty 2

## 2024-10-03 NOTE — Assessment & Plan Note (Addendum)
 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

## 2024-10-03 NOTE — Anesthesia Postprocedure Evaluation (Signed)
"   Anesthesia Post Note  Patient: [name]  Procedure(s) Performed: EGD (ESOPHAGOGASTRODUODENOSCOPY)     Patient location during evaluation: PACU Anesthesia Type: General Level of consciousness: awake and alert Pain management: pain level controlled Vital Signs Assessment: post-procedure vital signs reviewed and stable Respiratory status: spontaneous breathing, nonlabored ventilation, respiratory function stable and patient connected to nasal cannula oxygen Cardiovascular status: blood pressure returned to baseline and stable Postop Assessment: no apparent nausea or vomiting Anesthetic complications: no   No notable events documented.  Last Vitals:  Vitals:   10/03/24 1320 10/03/24 1330  BP: 120/79 122/74  Pulse: 78 73  Resp: 10 11  Temp:    SpO2: 100% 99%    Last Pain:  Vitals:   10/03/24 1330  TempSrc:   PainSc: 8                  Crayton Savarese R Elmira Olkowski      "

## 2024-10-03 NOTE — Anesthesia Preprocedure Evaluation (Signed)
"                                    Anesthesia Evaluation  Patient identified by MRN, date of birth, ID band Patient awake    Reviewed: Allergy & Precautions, H&P , NPO status , Patient's Chart, lab work & pertinent test results  History of Anesthesia Complications (+) PONV and history of anesthetic complications  Airway Mallampati: II  TM Distance: >3 FB Neck ROM: Full    Dental no notable dental hx. (+) Edentulous Upper, Edentulous Lower, Dental Advisory Given   Pulmonary asthma , former smoker   Pulmonary exam normal breath sounds clear to auscultation       Cardiovascular (-) hypertension(-) angina (-) Past MI  Rhythm:Regular Rate:Normal     Neuro/Psych  Headaches PSYCHIATRIC DISORDERS Anxiety Depression    TIA   GI/Hepatic hiatal hernia, PUD,GERD  ,,(+)     substance abuse  Hx of gastric sleeve converted gastric bypass. \ Hematemesis   Endo/Other  negative endocrine ROS    Renal/GU negative Renal ROS  negative genitourinary   Musculoskeletal  (+) Arthritis ,  narcotic dependent  Abdominal   Peds  Hematology  (+) Blood dyscrasia, anemia   Anesthesia Other Findings Suboxone use   Reproductive/Obstetrics negative OB ROS                              Anesthesia Physical Anesthesia Plan  ASA: 3  Anesthesia Plan: General   Post-op Pain Management: Minimal or no pain anticipated   Induction: Intravenous  PONV Risk Score and Plan: 4 or greater and Propofol  infusion and Treatment may vary due to age or medical condition  Airway Management Planned: Oral ETT  Additional Equipment: None  Intra-op Plan:   Post-operative Plan: Extubation in OR  Informed Consent: I have reviewed the patients History and Physical, chart, labs and discussed the procedure including the risks, benefits and alternatives for the proposed anesthesia with the patient or authorized representative who has indicated his/her understanding and  acceptance.     Dental advisory given  Plan Discussed with: CRNA  Anesthesia Plan Comments: [name] is a 49 y.o. 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  Denies any diarrhea.  Patient states she is on chronic pain relief medication including Dilaudid  and Suboxone which she was not able to take because of the vomiting.  She had some episodes of diarrhea over the last few days but usually she is constipated. )        Anesthesia Quick Evaluation  "

## 2024-10-03 NOTE — Plan of Care (Signed)
" °  Problem: Nutrition: Goal: Adequate nutrition will be maintained Outcome: Progressing   Problem: Coping: Goal: Level of anxiety will decrease Outcome: Progressing   Problem: Elimination: Goal: Will not experience complications related to bowel motility Outcome: Progressing   Problem: Elimination: Goal: Will not experience complications related to urinary retention Outcome: Progressing   Problem: Pain Managment: Goal: General experience of comfort will improve and/or be controlled Outcome: Progressing   Problem: Safety: Goal: Ability to remain free from injury will improve Outcome: Progressing   "

## 2024-10-03 NOTE — Progress Notes (Signed)
 Brief Nutrition Note  Consult received for enteral/tube feeding initiation and management. Per surgery, resume night feed regimen.  Per review of home meds, pt administers Nutren 1.5 at home which not available on our formulary.  Adult Enteral Nutrition Protocol initiated. Full assessment to follow.  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 Will initiate Osmolite 1.5 which is equivalent to Nutren 1.5. Will initiate at trickle rate to ensure tolerance and advance as able next date.  Admitting Dx: Enteritis [K52.9] UGI bleed [K92.2] Hematemesis with nausea [K92.0]  There is no height or weight on file to calculate BMI. Pt needs weight for admission. Daily weights order with TF protocol.  Labs:  Recent Labs  Lab 10/02/24 1648 10/02/24 1830 10/02/24 1845  NA 137 138 137  K 4.3 3.7 3.7  CL 100 104 101  CO2  --  27  --   BUN 21* 19 21*  CREATININE 0.60 0.47 0.50  CALCIUM   --  8.9  --   GLUCOSE 131* 195* 179*    Morna Lee, MS, RD, LDN Inpatient Clinical Dietitian Contact via Secure chat

## 2024-10-03 NOTE — Assessment & Plan Note (Signed)
 Continue amitriptyline , bupropion , buspirone , Klonopin , duloxetine 

## 2024-10-03 NOTE — ED Notes (Signed)
 Gave pt apple juice RN aware gave the ok

## 2024-10-03 NOTE — Assessment & Plan Note (Addendum)
 Nutrition consult Resume tube feeds

## 2024-10-03 NOTE — Progress Notes (Addendum)
 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  She has been followed at [facility] recently to discuss reversal of her bypass.  Unfortunately during this wait, she presented to the ED in November of 2025 with pneumoperitoneum secondary to a large perforation of the anterior wall of the GJ anastomosis.  She underwent a laparoscopic graham patch repair by Dr. Tanda on 08/21/24.  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  She is on PPI therapy and Carafate  at home.  She presents back to the [facility] last night secondary to coffee ground emesis that started on Sunday.  GI was asked to see her and she underwent an EGD today.  She has multiple ulcers noted (which are known) but no active bleeding.  Her hgb yesterday was 10.6, but has not been rechecked today.  Her vitals are stable.  We have been asked to see her.  ROS: See above, otherwise other systems negative  Objective: Vital signs in last 24 hours: Temp:  [97.9 F (36.6 C)-98.9 F (37.2 C)] 98.4 F (36.9 C) (01/06 1526) Pulse Rate:  [73-104] 89 (01/06 1526) Resp:  [10-20] 20 (01/06 1526) BP: (108-133)/(69-89) 116/75 (01/06 1526) SpO2:  [98 %-100 %] 100 % (01/06 1526)    Intake/Output from previous day: No intake/output data recorded. Intake/Output this shift: Total I/O In: 350 [I.V.:350] Out: -   PE: Gen: NAD, sitting up in bed Lungs: respiratory effort unlabored Abd: soft, mild chronic tenderness to her abdominal wall, g-tube in place and clamped off, ND  Lab Results:  Recent Labs    10/02/24 1632 10/02/24 1648 10/02/24 1845  WBC 15.3*  --   --   HGB 10.6* 12.2 10.5*  HCT 34.6* 36.0 31.0*  PLT 417*  --   --    BMET Recent Labs    10/02/24 1830 10/02/24 1845  NA 138 137  K 3.7 3.7  CL 104 101  CO2  27  --   GLUCOSE 195* 179*  BUN 19 21*  CREATININE 0.47 0.50  CALCIUM  8.9  --    PT/INR Recent Labs    10/02/24 1632  LABPROT 13.4  INR 1.0   CMP     Component Value Date/Time   NA 137 10/02/2024 1845   NA 137 06/11/2022 1620   K 3.7 10/02/2024 1845   CL 101 10/02/2024 1845   CO2 27 10/02/2024 1830   GLUCOSE 179 (H) 10/02/2024 1845   BUN 21 (H) 10/02/2024 1845   BUN 20 06/11/2022 1620   CREATININE 0.50 10/02/2024 1845   CREATININE 0.88 06/05/2016 1003   CALCIUM  8.9 10/02/2024 1830   PROT 6.1 (L) 10/02/2024 1830   PROT 7.4 06/11/2022 1620   ALBUMIN  3.2 (L) 10/02/2024 1830   ALBUMIN  4.7 06/11/2022 1620   AST 13 (L) 10/02/2024 1830   ALT 7 10/02/2024 1830   ALKPHOS 77 10/02/2024 1830   BILITOT <0.2 10/02/2024 1830   BILITOT 0.4 06/11/2022 1620   GFRNONAA >60 10/02/2024 1830   GFRNONAA 84 06/05/2016 1003   GFRAA >60 01/24/2020 0253   GFRAA >89 06/05/2016 1003   Lipase     Component Value Date/Time   LIPASE 15 08/21/2024 0140       Studies/Results: CT ABDOMEN PELVIS W CONTRAST Result Date:  10/02/2024 CLINICAL DATA:  Hematemesis with fatigue and weakness. EXAM: CT ABDOMEN AND PELVIS WITH CONTRAST TECHNIQUE: Multidetector CT imaging of the abdomen and pelvis was performed using the standard protocol following bolus administration of intravenous contrast. RADIATION DOSE REDUCTION: This exam was performed according to the departmental dose-optimization program which includes automated exposure control, adjustment of the mA and/or kV according to patient size and/or use of iterative reconstruction technique. CONTRAST:  OMNIPAQUE  IOHEXOL  300 MG/ML  SOLN COMPARISON:  September 23, 2024 FINDINGS: Lower chest: No acute abnormality. Hepatobiliary: There is diffuse fatty infiltration of the liver parenchyma. No focal liver abnormality is seen. Status post cholecystectomy. No biliary dilatation. Pancreas: Unremarkable. No pancreatic ductal dilatation or surrounding inflammatory  changes. Spleen: A 2.5 cm well-defined area of parenchymal low attenuation is seen within the anterior aspect of an otherwise normal-appearing spleen. Adrenals/Urinary Tract: Adrenal glands are unremarkable. Kidneys are normal, without renal calculi, focal lesion, or hydronephrosis. Urinary bladder is poorly distended and is otherwise unremarkable. Stomach/Bowel: Surgical sutures are seen throughout the gastric region with surgically anastomosed bowel noted within the mid left abdomen. A percutaneous gastrostomy tube is in place with surgical sutures seen adjacent to the site of insufflator bulb insertion. Appendix appears normal. No evidence of bowel dilatation. Moderate to markedly thickened, poorly distended small bowel loops are seen along the region distal to the gastric surgical anastomosis. Vascular/Lymphatic: No significant vascular findings are present. No enlarged abdominal or pelvic lymph nodes. Reproductive: Status post hysterectomy. No adnexal masses. Other: No abdominal wall hernia or abnormality. No abdominopelvic ascites. Musculoskeletal: No acute or significant osseous findings. IMPRESSION: 1. Moderate to markedly thickened, poorly distended small bowel loops along the region distal to the gastric surgical anastomosis, which may represent sequelae associated with enteritis. 2. Hepatic steatosis. 3. Evidence of prior cholecystectomy and hysterectomy. 4. Percutaneous gastrostomy tube in place. Electronically Signed   By: Suzen Dials M.D.   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 Tanda 08/21/24 -EGD reviewed.  Multiple known ulcers noted.  No active bleeding or stigmata of bleeding noted currently.  If she were to have more bleeding, she would require  transfer to [facility] for the surgeons there to reverse her bypass, likely requiring an esophagogastrectomy due to how little stomach she has remaining given she had a sleeve prior to a bypass. -for now she is not bleeding and HDS -can resume/continue nightly TFs -she may have an oral diet from our standpoint as she tolerates.  She normally isn't able to take much -BID PPI and carafate .  When converted to oral protonix , will need this crushed and not whole when taken -no acute surgical needs at this time.   FEN - CLD, resume nightly TFs through remnant stomach VTE - on hold due to recent UGI bleed ID - none currently needed    LOS: 1 day    Burnard FORBES Banter , [facility] Surgery 10/03/2024, 3:28 PM Please see Amion for pager number during day hours 7:00am-4:30pm or 7:00am -11:30am on weekends  "

## 2024-10-03 NOTE — Consult Note (Signed)
 Referring Provider: Dr. Barbarann Primary Care Physician:  Orion Kerns, MD Primary Gastroenterologist:  Sampson  Reason for Consultation:  Hematemesis  HPI: [name] is a 49 y.o. 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 S/P PEG placement in 2024. Vomited blood in 11/25 and EGD (08/11/24) by Dr. Saintclair showed a large marginal ulcer with adherent clot. Patient developed perforation and had gram pathc repair of perforated ulcer on 08/21/24. Reports acute onset of vomiting 2 days ago that started out clear and became black and red colored X 8. Having LUQ pain and feels like she has a mango-sized knot in her LUQ. Denies melena or hematochezia. Denies NSAIDs or alcohol. Hgb 10.5.  Past Medical History:  Diagnosis Date   Anal fissure - posterior 10/16/2014    OCC  ISSUES   Anxiety    doesn't take anything   Asthma    has Albuterol  inhaler as needed   Bronchitis    in winter   Chronic headache disorder 07/22/2016   Clostridium difficile infection 04/20/2012   Dehydration    Depression    Family history of adverse reaction to anesthesia    pt mom gets sick   GERD (gastroesophageal reflux disease)    takes Pantoprazole  daily   Headache    Hiatal hernia    neuropathy - mild in arms and legs   History of blood transfusion    last transfusion was 04/04/2016=Benadryl  was given d/t itching. States she always itches with transfusion.    History of migraine    last one 05/01/16   History of stomach ulcers    History of urinary tract infection LAST 2 WEEKS AGO   IDA (iron  deficiency anemia)    Internal and external bleeding hemorrhoids 06/11/2014   Joint pain    Joint swelling    Left sided chronic colitis - segmental 06/11/2014   Marginal ulcer 10/25/2017   Motion sickness    Nausea    takes Zofran  as needed   Nausea and vomiting    for 1 year   Oligouria    Osteoarthritis    lower back, knees,  wrists - no meds   Peripheral neuropathy 03/01/2019   Personal history of gastric bypass    Postoperative nausea and vomiting 01/21/2016   wants scopolamine  patch   Right carpal tunnel syndrome 03/01/2019   SVD (spontaneous vaginal delivery)    x 4   TOBACCO USER 10/02/2009   Qualifier: Diagnosis of  By: Lida MD, Artist     Tremor 08/31/2018   UC (ulcerative colitis) (HCC)    supposed to be taking Lialda  and Bentyl  but has been off since gastric sleeve   Unsteady gait when walking 2024   uses a walker   Vertigo    doesn't take any meds    Past Surgical History:  Procedure Laterality Date   ABDOMINAL HYSTERECTOMY     PARTIAL   BIOPSY  01/20/2023   Procedure: BIOPSY;  Surgeon: Saintclair Jasper, MD;  Location: [facility];  Service: Gastroenterology;;   BLADDER STIMULATOR IMPLANT     COLONOSCOPY  2007   for rectal bleeding; [facility]   COLONOSCOPY N/A 06/11/2014   Procedure: COLONOSCOPY;  Surgeon: Lupita FORBES Commander, MD;  Location: [facility];  Service: Endoscopy;  Laterality: N/A;   COLONOSCOPY WITH PROPOFOL  N/A 03/02/2017   Procedure: COLONOSCOPY WITH PROPOFOL ;  Surgeon: Ethyl Lenis, MD;  Location: [facility];  Service: General;  Laterality: N/A;  DILATION AND CURETTAGE OF UTERUS     ESOPHAGOGASTRODUODENOSCOPY N/A 07/25/2023   Procedure: ESOPHAGOGASTRODUODENOSCOPY (EGD);  Surgeon: Kriss Estefana DEL, DO;  Location: [facility];  Service: Gastroenterology;  Laterality: N/A;   ESOPHAGOGASTRODUODENOSCOPY N/A 10/02/2023   Procedure: ESOPHAGOGASTRODUODENOSCOPY (EGD);  Surgeon: Burnette Fallow, MD;  Location: [facility];  Service: Gastroenterology;  Laterality: N/A;   ESOPHAGOGASTRODUODENOSCOPY N/A 08/11/2024   Procedure: EGD (ESOPHAGOGASTRODUODENOSCOPY);  Surgeon: Saintclair Jasper, MD;  Location: [facility];  Service: Gastroenterology;  Laterality: N/A;   ESOPHAGOGASTRODUODENOSCOPY (EGD) WITH PROPOFOL  N/A 04/06/2016   Procedure: ESOPHAGOGASTRODUODENOSCOPY (EGD) WITH PROPOFOL ;  Surgeon:  Alm Angle, MD;  [facility];  Service: General;  Laterality: N/A;   ESOPHAGOGASTRODUODENOSCOPY (EGD) WITH PROPOFOL  N/A 03/02/2017   Procedure: ESOPHAGOGASTRODUODENOSCOPY (EGD) WITH PROPOFOL ;  Surgeon: Angle Alm, MD;  Location: [facility];  Service: General;  Laterality: N/A;   ESOPHAGOGASTRODUODENOSCOPY (EGD) WITH PROPOFOL  N/A 05/20/2017   Procedure: ESOPHAGOGASTRODUODENOSCOPY (EGD) WITH PROPOFOL  ERAS PATHWAY;  Surgeon: Angle Alm, MD;  Location: [facility];  Service: General;  Laterality: N/A;   ESOPHAGOGASTRODUODENOSCOPY (EGD) WITH PROPOFOL  N/A 10/07/2017   Procedure: ESOPHAGOGASTRODUODENOSCOPY (EGD) WITH PROPOFOL ;  Surgeon: Angle Alm, MD;  Location: [facility];  Service: General;  Laterality: N/A;   ESOPHAGOGASTRODUODENOSCOPY (EGD) WITH PROPOFOL  N/A 01/20/2023   Procedure: ESOPHAGOGASTRODUODENOSCOPY (EGD) WITH PROPOFOL ;  Surgeon: Saintclair Jasper, MD;  [facility]: [facility];  Service: Gastroenterology;  Laterality: N/A;   EXCISION OF SKIN TAG  06/08/2017   Procedure: EXCISION OF VULVAR SKIN TAGS X2;  Surgeon: Fredirick Glenys RAMAN, MD;  Location: [facility];  Service: Gynecology;;   FOOT SURGERY Bilateral    x 2   GASTRIC ROUX-EN-Y N/A 12/14/2016   Procedure: LAPAROSCOPIC REVISION SLEEVE GASTRECTOMY TO  ROUX-Y-GASTRIC BY-PASS, UPPER ENDO;  Surgeon: Morene Olives, MD;  Location: [facility];  Service: General;  Laterality: N/A;   GASTROJEJUNOSTOMY N/A 05/11/2016   Procedure: LAPAROSCOPIC PLACEMENT  OF FEEDING  JEJUNOSTOMY TUBE;  Surgeon: Morene Olives, MD;  Location: [facility];  Service: General;  Laterality: N/A;   HEMORRHOIDECTOMY WITH HEMORRHOID BANDING     HEMOSTASIS CLIP PLACEMENT  07/25/2023   Procedure: HEMOSTASIS CLIP PLACEMENT;  Surgeon: Kriss Estefana DEL, DO;  [facility]: [facility];  Service: Gastroenterology;;   HOT HEMOSTASIS N/A 07/25/2023   Procedure: HOT HEMOSTASIS (ARGON PLASMA COAGULATION/BICAP);  Surgeon: Kriss Estefana DEL, DO;  Location: [facility];   Service: Gastroenterology;  Laterality: N/A;   IR FLUORO GUIDE CV LINE RIGHT  04/06/2017   IR GASTROSTOMY TUBE MOD SED  08/25/2023   IR GENERIC HISTORICAL  05/19/2016   IR CM INJ ANY COLONIC TUBE W/FLUORO 05/19/2016 Marcey Moan, MD [facility]   IR PATIENT EVAL TECH 0-60 MINS  11/30/2017   IR REPLC DUODEN/JEJUNO TUBE PERCUT W/FLUORO  03/14/2018   IR REPLC DUODEN/JEJUNO TUBE PERCUT W/FLUORO  03/15/2018   IR US  GUIDE VASC ACCESS LEFT  08/25/2023   IR US  GUIDE VASC ACCESS LEFT  08/25/2023   IR US  GUIDE VASC ACCESS RIGHT  04/06/2017   j tube removed march 2018     KNEE ARTHROSCOPY Left 09/06/2014   LAPAROSCOPIC GASTRIC SLEEVE RESECTION N/A 01/14/2016   Procedure: LAPAROSCOPIC GASTRIC SLEEVE RESECTION;  Surgeon: Morene Olives, MD;  [facility];  Service: General;  Laterality: N/A;   LAPAROSCOPIC INSERTION GASTROSTOMY TUBE N/A 09/14/2023   Procedure: LAPAROSCOPIC REMNANT GASTROSTOMY TUBE PLACEMENT;  Surgeon: Lyndel Deward PARAS, MD;  Location: [facility];  Service: General;  Laterality: N/A;   LAPAROSCOPIC REVISION OF GASTROJEJUNOSTOMY N/A 10/25/2017   Procedure: LAPAROSCOPIC REVISION OF GASTROJEJUNOSTOMY AND  PARTIAL GASTRECTOMY, WITH PLACEMENT OF FEEDING GASTROSTOMY TUBE;  Surgeon: Mikell Katz, MD;  [facility]: [facility];  Service: General;  Laterality: N/A;   LAPAROSCOPIC TUBAL LIGATION  10/16/2011   Procedure: LAPAROSCOPIC TUBAL LIGATION;  Surgeon: Nena DELENA App, MD;  Location: [facility];  Service: Gynecology;  Laterality: Bilateral;   LAPAROSCOPY N/A 08/21/2024   Procedure: LAPAROSCOPY, DIAGNOSTIC, LAPAROSCOPIC GRAM PATCH OF ULCER;  Surgeon: Tanda Locus, MD;  Location: [facility];  Service: General;  Laterality: N/A;  DIAGNOSTIC LAPAROSCOPY   NOVASURE ABLATION  09/28/2010   mild persistent vaginal bleeding   right knee arthroscopy     05/07/2016   TOTAL KNEE ARTHROPLASTY Left 03/10/2018   Procedure: LEFT TOTAL KNEE ARTHROPLASTY;  Surgeon: Ernie Cough, MD;  Location: [facility];   Service: Orthopedics;  Laterality: Left;  70 mins   TOTAL KNEE ARTHROPLASTY Right 01/23/2020   Procedure: TOTAL KNEE ARTHROPLASTY, BILATERAL TROCHANTERIC INJECTION;  Surgeon: Ernie Cough, MD;  Location: [facility];  Service: Orthopedics;  Laterality: Right;  70 mins for Bilateral Troch injection 1 cc Depo and 2 CC Lidocaine    VAGINAL HYSTERECTOMY Bilateral 06/08/2017   Procedure: HYSTERECTOMY VAGINAL W/ BILATERAL SALPINGECTOMY;  Surgeon: Fredirick Glenys RAMAN, MD;  Location: [facility];  Service: Gynecology;  Laterality: Bilateral;   wisdom teeth extracted      Prior to Admission medications  Medication Sig Start Date End Date Taking? Authorizing Provider  ACCRUFER  30 MG CAPS Take 30 mg by mouth 2 (two) times daily.   Yes [provider]  albuterol  (VENTOLIN  HFA) 108 (90 Base) MCG/ACT inhaler Inhale 2 puffs into the lungs every 6 (six) hours as needed for wheezing or shortness of breath.   Yes [provider]  amitriptyline  (ELAVIL ) 25 MG tablet Take 50 mg by mouth at bedtime.   Yes [provider]  atorvastatin  (LIPITOR) 40 MG tablet Take 40 mg by mouth daily. 12/10/22  Yes [provider]  baclofen  (LIORESAL ) 10 MG tablet Take 10 mg by mouth 3 (three) times daily.   Yes [provider]  botulinum toxin Type A  (BOTOX ) 200 units injection INJECT 155 UNITS INTRAMUSCULARLY INTO HEAD AND NECK MUSCLES EVERY 3 MONTHS 11/23/22  Yes Onita Duos, MD  Buprenorphine  HCl 600 MCG FILM Place 600 mcg under the tongue in the morning and at bedtime.   Yes [provider]  buPROPion  (WELLBUTRIN  XL) 150 MG 24 hr tablet Take 150 mg by mouth in the morning.   Yes [provider]  buPROPion  (WELLBUTRIN  XL) 300 MG 24 hr tablet Take 300 mg by mouth in the morning. 06/05/20  Yes [provider]  busPIRone  (BUSPAR ) 15 MG tablet Take 15 mg by mouth 3 (three) times daily.   Yes [provider]  Cholecalciferol (VITAMIN D3 PO) Take 1 capsule by mouth daily.    Yes [provider]  clonazePAM  (KLONOPIN ) 1 MG tablet Take 1 mg by mouth 2 (two) times daily as needed for anxiety.   Yes [provider]  cyanocobalamin  (,VITAMIN B-12,) 1000 MCG/ML injection Inject 1,000 mcg into the muscle 2 (two) times a week.  04/06/19  Yes [provider]  diclofenac  Sodium (VOLTAREN ) 1 % GEL Apply 2 g topically 4 (four) times daily as needed (Pain).   Yes [provider]  dicyclomine  (BENTYL ) 10 MG capsule Take 10 mg by mouth 4 (four) times daily -  before meals and at bedtime.   Yes [provider]  DULoxetine  (CYMBALTA ) 60 MG capsule Take 1 capsule (60 mg total)  by mouth 2 (two) times daily. 04/10/19  Yes Wonda Clarita BRAVO, NP  EPINEPHrine  0.3 mg/0.3 mL IJ SOAJ injection Inject 0.3 mg into the muscle as needed for anaphylaxis. 04/12/21  Yes Eudelia Maude SAUNDERS, PA-C  famotidine  (PEPCID ) 20 MG tablet Take 20 mg by mouth 2 (two) times daily.   Yes [provider]  fluticasone  (FLONASE ) 50 MCG/ACT nasal spray Place 1-2 sprays into both nostrils daily as needed for allergies or rhinitis.   Yes [provider]  gabapentin  (NEURONTIN ) 600 MG tablet Take 900 mg by mouth 3 (three) times daily.   Yes [provider]  HYDROmorphone  (DILAUDID ) 4 MG tablet Take 4 mg by mouth every 4 (four) hours. 09/17/21  Yes [provider]  linaclotide  (LINZESS ) 290 MCG CAPS capsule Take 290 mcg by mouth daily before breakfast.   Yes [provider]  mesalamine  (LIALDA ) 1.2 g EC tablet Take 2.4 g by mouth 2 (two) times daily.   Yes [provider]  methocarbamol  (ROBAXIN ) 500 MG tablet Take 2 tablets (1,000 mg total) by mouth every 6 (six) hours as needed (use for muscle cramps/pain). Patient taking differently: Take 1,000 mg by mouth every 6 (six) hours as needed (for muscle cramps/pain). 08/30/24  Yes Augustus Almarie RAMAN, PA-C  midodrine  (PROAMATINE ) 10 MG tablet Take 1 tablet (10 mg total) by mouth 3  (three) times daily with meals. 08/17/24  Yes Odell Celinda Balo, MD  misoprostol  (CYTOTEC ) 200 MCG tablet Take 200 mcg by mouth daily.   Yes [provider]  Multiple Vitamin (MULTIVITAMIN WITH MINERALS) TABS tablet Take 1 tablet by mouth daily with breakfast.   Yes [provider]  NARCAN  4 MG/0.1ML LIQD nasal spray kit Place 1 spray into the nose daily as needed (overdose). 03/18/20  Yes [provider]  Nutritional Supplements (NUTREN 2.0) LIQD Take by mouth daily.   Yes [provider]  nystatin  (MYCOSTATIN ) 100000 UNIT/ML suspension Take 5 mLs by mouth See admin instructions. 5 ml's by mouth three times a day AS DIRECTED   Yes [provider]  omeprazole  (PRILOSEC ) 40 MG capsule Take 40 mg by mouth 2 (two) times daily before a meal.   Yes [provider]  ondansetron  (ZOFRAN -ODT) 8 MG disintegrating tablet Take 8 mg by mouth See admin instructions. Dissolve 8 mg in the mouth every eight hours 06/13/24  Yes [provider]  oxybutynin  (DITROPAN -XL) 5 MG 24 hr tablet Take 10 mg by mouth at bedtime. 08/16/24  Yes [provider]  PRESCRIPTION MEDICATION Take 400 mg by mouth See admin instructions. Magnesium  ES 400 mg SGC - Crush/dissolve 400 mg and take by mouth once a day   Yes [provider]  QULIPTA  30 MG TABS TAKE 1 TABLET BY MOUTH EVERY DAY Patient taking differently: Take 30 mg by mouth daily. 12/27/23  Yes Gayland Lauraine PARAS, NP  sucralfate  (CARAFATE ) 1 GM/10ML suspension Take 10 mLs (1 g total) by mouth 4 (four) times daily. 08/30/24 10/29/24 Yes Simaan, Almarie RAMAN, PA-C  SUMAtriptan  (IMITREX ) 50 MG tablet Take 50 mg by mouth daily as needed for migraine or headache (and may repeat once in 2 hours if headache persists or recurs).   Yes [provider]  SYMPROIC  0.2 MG TABS Take 0.2 mg by mouth daily before breakfast. 08/17/24  Yes [provider]  thiamine  (VITAMIN B-1) 100 MG tablet Take 100 mg by  mouth daily at 12 noon.    Yes [provider]  torsemide  (DEMADEX ) 5  MG tablet Take 5 mg by mouth daily.   Yes [provider]  Vitamin D , Ergocalciferol , (DRISDOL ) 1.25 MG (50000 UNIT) CAPS capsule Take 50,000 Units by mouth every Monday. 08/16/24  Yes [provider]  Nutritional Supplements (FEEDING SUPPLEMENT, OSMOLITE 1.5 CAL,) LIQD Place 1,000 mLs into feeding tube daily. Patient not taking: Reported on 10/02/2024 08/30/24   Augustus Almarie RAMAN, PA-C  Nystatin  (GERHARDT'S BUTT CREAM) CREA Apply 1 Application topically 3 (three) times daily. Patient not taking: Reported on 10/02/2024 08/17/24   Odell Celinda Balo, MD  pantoprazole  (PROTONIX ) 40 MG tablet Take 1 tablet (40 mg total) by mouth 2 (two) times daily before a meal. Crush and take twice daily Patient not taking: Reported on 10/02/2024 08/30/24 10/29/24  Augustus Almarie RAMAN, PA-C  valACYclovir  (VALTREX ) 1000 MG tablet Take 1,000 mg by mouth 2 (two) times daily. Patient not taking: Reported on 10/02/2024    [provider]    Scheduled Meds:  buPROPion   300 mg Oral q AM   DULoxetine   60 mg Oral BID   linaclotide   290 mcg Oral QAC breakfast   mesalamine   2.4 g Oral BID   midodrine   10 mg Oral TID WC   misoprostol   200 mcg Oral Daily   Naldemedine Tosylate   0.2 mg Oral QAC breakfast   pantoprazole  (PROTONIX ) IV  40 mg Intravenous Q12H   sodium chloride  flush  3 mL Intravenous Q12H   Continuous Infusions:  lactated ringers  125 mL/hr at 10/02/24 2139   PRN Meds:.acetaminophen  **OR** acetaminophen , clonazePAM , HYDROmorphone  (DILAUDID ) injection, methocarbamol , ondansetron  **OR** ondansetron  (ZOFRAN ) IV, sorbitol   Allergies as of 10/02/2024 - Review Complete 10/02/2024  Allergen Reaction Noted   Ciprofloxacin  Itching, Dermatitis, and Rash 04/20/2012   Coconut (cocos nucifera) Anaphylaxis and Itching 06/14/2017   Coconut oil Anaphylaxis and Itching 06/14/2017   Doxycycline Nausea And Vomiting  09/02/2011   Oxycodone Anaphylaxis 09/02/2011   Trazodone  and nefazodone Rash 04/10/2019   Hydroxyzine  Hives and Rash 08/03/2024   Nefazodone Rash 08/03/2024   Penicillins Hives, Rash, and Other (See Comments) 09/02/2011   Silicone Rash 06/11/2022   Tape Rash and Other (See Comments) 01/31/2017   Trazodone  Rash 08/03/2024   Wound dressing adhesive Rash 08/03/2024    Family History  Problem Relation Age of Onset   Hypertension Mother    Diabetes Mother    Sarcoidosis Mother        lungs and skin   Asthma Mother    Hypertension Father    Diabetes Father    Asthma Son    Tics Son    Asthma Sister    Cancer Sister        possible pancreatic cancer   Adrenal disorder Sister        Tumor    Asthma Brother    Asthma Daughter        died age 32.5   Cancer Daughter 4       brain; died age 32.5   Asthma Son    Cancer Paternal Aunt        brain, colon, lung and esophagus; unsure of primary    Breast cancer Paternal Aunt     Social History   Socioeconomic History   Marital status: Single    Spouse name: Not on file   Number of children: 3   Years of education: Some college   Highest education level: Not on file  Occupational History   Occupation: English As A Second Language Teacher    Comment: Currently out on disability  d/t surg  Tobacco Use   Smoking status: Former    Current packs/day: 0.00    Average packs/day: 0.5 packs/day for 20.0 years (10.0 ttl pk-yrs)    Types: Cigarettes    Start date: 03/17/1994    Quit date: 03/17/2014    Years since quitting: 10.5   Smokeless tobacco: Never  Vaping Use   Vaping status: Never Used  Substance and Sexual Activity   Alcohol use: Not Currently   Drug use: No   Sexual activity: Not Currently    Birth control/protection: Surgical, Abstinence  Other Topics Concern   Not on file  Social History Narrative   Lives with 2 sons   Right Handed   Drinks caffeine2-3 cups daily   Social Drivers of Health   Tobacco Use: Medium Risk (09/19/2024)    Received from [facility]   Patient History    Smoking Tobacco Use: Former    Smokeless Tobacco Use: Unknown    Passive Exposure: Not on file  Financial Resource Strain: Not on file  Food Insecurity: No Food Insecurity (08/21/2024)   Epic    Worried About Radiation Protection Practitioner of Food in the Last Year: Never true    Ran Out of Food in the Last Year: Never true  Transportation Needs: No Transportation Needs (08/21/2024)   Epic    Lack of Transportation (Medical): No    Lack of Transportation (Non-Medical): No  Physical Activity: Not on file  Stress: Not on file  Social Connections: Not on file  Intimate Partner Violence: Not At Risk (08/21/2024)   Epic    Fear of Current or Ex-Partner: No    Emotionally Abused: No    Physically Abused: No    Sexually Abused: No  Depression (PHQ2-9): High Risk (08/25/2023)   Depression (PHQ2-9)    PHQ-2 Score: 21  Alcohol Screen: Not on file  Housing: Low Risk (08/21/2024)   Epic    Unable to Pay for Housing in the Last Year: No    Number of Times Moved in the Last Year: 1    Homeless in the Last Year: No  Utilities: Not At Risk (08/21/2024)   Epic    Threatened with loss of utilities: No  Health Literacy: Not on file    Review of Systems: All negative except as stated above in HPI.  Physical Exam: Vital signs: Vitals:   10/03/24 0500 10/03/24 0731  BP: 109/70 127/84  Pulse: 79 77  Resp: 15 16  Temp:  98.5 F (36.9 C)  SpO2: 100% 100%     General:   Lethargic, thin, no acute distress, pleasant  Head: normocephalic, atraumatic Eyes: anicteric sclera ENT: oropharynx clear Neck: supple, nontender Lungs:  Clear throughout to auscultation.   No wheezes, crackles, or rhonchi. No acute distress. Heart:  Regular rate and rhythm; no murmurs, clicks, rubs,  or gallops. Abdomen: LUQ tenderness with guarding, soft, nondistended, +BS, PEG tube dressing noted  Rectal:  Deferred Ext: no edema  GI:  Lab Results: Recent Labs     10/02/24 1632 10/02/24 1648 10/02/24 1845  WBC 15.3*  --   --   HGB 10.6* 12.2 10.5*  HCT 34.6* 36.0 31.0*  PLT 417*  --   --    BMET Recent Labs    10/02/24 1648 10/02/24 1830 10/02/24 1845  NA 137 138 137  K 4.3 3.7 3.7  CL 100 104 101  CO2  --  27  --   GLUCOSE 131* 195* 179*  BUN  21* 19 21*  CREATININE 0.60 0.47 0.50  CALCIUM   --  8.9  --    LFT Recent Labs    10/02/24 1830  PROT 6.1*  ALBUMIN  3.2*  AST 13*  ALT 7  ALKPHOS 77  BILITOT <0.2   PT/INR Recent Labs    10/02/24 1632  LABPROT 13.4  INR 1.0      Impression/Plan: Hematemesis in setting of a chronic marginal ulcer that perforated in November with gram patch repair of perforation. EGD needed to further evaluate and if ulcer still present then needs to be evaluated for revision of anastomosis. NPO. Continue Protonix  40 mg IV Q 12 hours.    LOS: 1 day   [name] Sol  10/03/2024, 9:40 AM  Questions please call [phone]

## 2024-10-03 NOTE — Interval H&P Note (Signed)
 History and Physical Interval Note:  10/03/2024 12:20 PM  [name]  has presented today for surgery, with the diagnosis of Hematemesis.  The various methods of treatment have been discussed with the patient and family. After consideration of risks, benefits and other options for treatment, the patient has consented to  Procedures: EGD (ESOPHAGOGASTRODUODENOSCOPY) (N/A) as a surgical intervention.  The patient's history has been reviewed, patient examined, no change in status, stable for surgery.  I have reviewed the patient's chart and labs.  Questions were answered to the patient's satisfaction.     [name] Sol

## 2024-10-03 NOTE — Assessment & Plan Note (Signed)
 Continue Accrufer .

## 2024-10-03 NOTE — Assessment & Plan Note (Signed)
 She has chronic constipation and is on Linzess  daily and Symproic ; her CT shows quite a bit of stool but she has not been able to keep her medication down Resume Linzess  and Symproic 

## 2024-10-03 NOTE — Transfer of Care (Signed)
 Immediate Anesthesia Transfer of Care Note  Patient: [name]  Procedure(s) Performed: EGD (ESOPHAGOGASTRODUODENOSCOPY)  Patient Location: PACU  Anesthesia Type:General  Level of Consciousness: drowsy  Airway & Oxygen Therapy: Patient Spontanous Breathing  Post-op Assessment: Report given to RN and Post -op Vital signs reviewed and stable  Post vital signs: Reviewed and stable  Last Vitals:  Vitals Value Taken Time  BP    Temp    Pulse    Resp    SpO2      Last Pain:  Vitals:   10/03/24 1159  TempSrc: Temporal  PainSc: 7          Complications: No notable events documented.

## 2024-10-03 NOTE — ED Notes (Signed)
"  Pt ambulatory to the restroom.  "

## 2024-10-03 NOTE — Op Note (Signed)
 [facility] Patient Name: [name] Procedure Date: 10/03/2024 MRN: [id number] Attending MD: [name] Sol , MD, [id number] Date of Birth: 1976/06/29 CSN: [id number] Age: 49 Admit Type: Outpatient Procedure:                Upper GI endoscopy Indications:              Coffee-ground emesis Providers:                Jerrell KYM Sol, MD, Hoy Penner, RN, Felice Sar, Technician Referring MD:             hospital team Medicines:                General Anesthesia, See the Anesthesia note for                            documentation of the administered medications Complications:            No immediate complications. Estimated Blood Loss:     Estimated blood loss: none. Procedure:                Pre-Anesthesia Assessment:                           - Prior to the procedure, a History and Physical                            was performed, and patient medications and                            allergies were reviewed. The patient's tolerance of                            previous anesthesia was also reviewed. The risks                            and benefits of the procedure and the sedation                            options and risks were discussed with the patient.                            All questions were answered, and informed consent                            was obtained. Prior Anticoagulants: The patient has                            taken no anticoagulant or antiplatelet agents. ASA                            Grade Assessment: III - A patient with severe  systemic disease. After reviewing the risks and                            benefits, the patient was deemed in satisfactory                            condition to undergo the procedure.                           After obtaining informed consent, the endoscope was                            passed under direct vision. Throughout the                             procedure, the patient's blood pressure, pulse, and                            oxygen saturations were monitored continuously. The                            GIF-H190 ([id number]) Olympus endoscope was introduced                            through the mouth, and advanced to the afferent and                            efferent jejunal loops. The upper GI endoscopy was                            accomplished without difficulty. The patient                            tolerated the procedure well. Scope In: Scope Out: Findings:      The examined esophagus was normal.      The Z-line was regular and was found 38 cm from the incisors.      Evidence of a Roux-en-Y gastrojejunostomy was found. The gastrojejunal       anastomosis was characterized by ulceration and visible sutures. This       was traversed. The pouch-to-jejunum limb was characterized by       ulceration. The jejunojejunal anastomosis was characterized by       ulceration.      Three non-bleeding cratered gastric ulcers with pigmented material were       found at the anastomosis. The largest lesion was 15 mm in largest       dimension.      The examined jejunum was normal.      Unable to visualize gastrostomy tube internal bumper. Impression:               - Normal esophagus.                           - Z-line regular, 38 cm from the incisors.                           -  Roux-en-Y gastrojejunostomy with gastrojejunal                            anastomosis characterized by ulceration and visible                            sutures.                           - Non-bleeding gastric ulcers with pigmented                            material.                           - Normal examined jejunum.                           - No specimens collected. Moderate Sedation:      Not Applicable - Patient had care per Anesthesia. Recommendation:           - Chronic marginal ulcer that needs surgical                             evaluation for revision of anastomosis. IV PPI Q 12                            hours. Clear liquid diet.                           - Clear liquid diet.                           - Observe patient's clinical course. Procedure Code(s):        --- Professional ---                           [phone], Esophagogastroduodenoscopy, flexible,                            transoral; diagnostic, including collection of                            specimen(s) by brushing or washing, when performed                            (separate procedure) Diagnosis Code(s):        --- Professional ---                           K92.0, Hematemesis                           K25.9, Gastric ulcer, unspecified as acute or                            chronic, without hemorrhage or perforation  Z98.0, Intestinal bypass and anastomosis status CPT copyright 2022 [employer]. All rights reserved. The codes documented in this report are preliminary and upon coder review may  be revised to meet current compliance requirements. [name] Sol, MD 10/03/2024 12:56:44 PM This report has been signed electronically. Number of Addenda: 0

## 2024-10-03 NOTE — Progress Notes (Signed)
 " Progress Note   Patient: [name] FMW:[id number] DOB: July 21, 1976 DOA: 10/02/2024     1 DOS: the patient was seen and examined on 10/03/2024   Brief hospital course: 48yo with h/o remote complicated bariatric surgery resulting in recurrent UGI bleeding secondary to ulcers in the area of her GJ tube who presented on 1/5 with hematemesis.  She has chronic abdominal pain for which she takes opiates and chronic blood loss anemia for which she receives IV iron  infusions.  She had 8 episodes of coffee ground emesis in the ER on the say of presentation.  CT with thickened small bowel loops along the region distal to the gastric surgical anastomosis c/w enteritis.  GI and surgery consulted.  Started on IV PPI, continue misoprostol .  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 Five weeks s/p surgical repair of perforated stomach ulcer   [facility] consulted Nothing by mouth but she does have her tube feeds continually going into excluded stomach pouch Hgb 10.6 on presentation, now 8.7 IV Protonix , PO Carafate  IV fluids, continue tube feeds Continue Misoprostol  Continue Mesalamine   EGD today with gastrojejunal anastomosis with ulceration and visible stuures; pouch-to-jejunum limb with ulceration and jejunojejunal anastomosis also with ulceration; 3 non-bleeding cratered gastric ulcers with pigmented material at anastomosis EGD reports chronic marginal ulcer that needs surgical evaluation for revision of anastomosis Surgery consulted Protein-calorie malnutrition, moderate Nutrition consult Resume tube feeds 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 IDA (iron  deficiency anemia) Continue Accrufer  Hyperlipidemia Continue atorvastatin  Depression with anxiety Continue amitriptyline , bupropion , buspirone , Klonopin , duloxetine  Chronic pain Will change her scheduled Dilaudid  to 1mg  IV instead of 4mg   PO She is on buprenorphine  twice daily in addition to Dilaudid  which her son will have to bring in Continue gabapentin , methocarbamol  (but not also baclofen ) Chronic constipation She has chronic constipation and is on Linzess  daily and Symproic ; her CT shows quite a bit of stool but she has not been able to keep her medication down Resume Linzess  and Symproic  Orthostatic hypotension Continue midodrine        Consultants: GI Surgery  Procedures: EGD 1/6  Antibiotics: None    Subjective: Seen prior to EGD.  No further hematemesis overnight.  Still having abdominal pain.  Physical Exam: Vitals:   10/03/24 1310 10/03/24 1320 10/03/24 1330 10/03/24 1526  BP: 122/75 120/79 122/74 116/75  Pulse: 76 78 73 89  Resp: 12 10 11 20   Temp:    98.4 F (36.9 C)  TempSrc:    Oral  SpO2: 99% 100% 99% 100%    Exam:  General:  Appears calm and comfortable and is in NAD Eyes:  normal lids, iris ENT:  grossly normal hearing, lips & tongue, mmm Cardiovascular:  RRR, no m/r/g. No LE edema.  Respiratory:   CTA bilaterally with no wheezes/rales/rhonchi.  Normal respiratory effort. Abdomen:  soft, epigastric TTP Skin:  no rash or induration seen on limited exam Musculoskeletal:  grossly normal tone BUE/BLE, good ROM, no bony abnormality Psychiatric:  blunted mood and affect, speech fluent and appropriate, AOx3 Neurologic:  CN 2-12 grossly intact, moves all extremities in coordinated fashion  Data Reviewed: I have reviewed the patient's lab results since admission.  Pertinent labs for today include:   Na++ 133, not clinically significant Glucose 121 WBC 9 Hgb 8.7, down from 10.6    Family Communication: None present  Disposition: Status is: Inpatient Remains inpatient appropriate because: ongoing monitoring  Planned Discharge  Destination: Home    Time spent: 50 minutes  Author: Delon Herald, MD 10/03/2024 6:02 PM  For on call review [email or web address].  "

## 2024-10-03 NOTE — Anesthesia Procedure Notes (Signed)
 Procedure Name: Intubation Date/Time: 10/03/2024 12:31 PM  Performed by: Belvie Valri NOVAK, CRNAPre-anesthesia Checklist: Patient identified, Emergency Drugs available, Suction available, Patient being monitored and Timeout performed Patient Re-evaluated:Patient Re-evaluated prior to induction Oxygen Delivery Method: Circle System Utilized Preoxygenation: Pre-oxygenation with 100% oxygen Induction Type: IV induction and Rapid sequence Laryngoscope Size: Mac and 3 Grade View: Grade I Tube type: Oral Tube size: 7.0 mm Number of attempts: 1 Airway Equipment and Method: Stylet Placement Confirmation: ETT inserted through vocal cords under direct vision, positive ETCO2 and breath sounds checked- equal and bilateral Secured at: 23 cm Tube secured with: Tape Dental Injury: Teeth and Oropharynx as per pre-operative assessment

## 2024-10-03 NOTE — H&P (View-Only) (Signed)
 Referring Provider: Dr. Barbarann Primary Care Physician:  Orion Kerns, MD Primary Gastroenterologist:  Sampson  Reason for Consultation:  Hematemesis  HPI: [name] is a 49 y.o. 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 S/P PEG placement in 2024. Vomited blood in 11/25 and EGD (08/11/24) by Dr. Saintclair showed a large marginal ulcer with adherent clot. Patient developed perforation and had gram pathc repair of perforated ulcer on 08/21/24. Reports acute onset of vomiting 2 days ago that started out clear and became black and red colored X 8. Having LUQ pain and feels like she has a mango-sized knot in her LUQ. Denies melena or hematochezia. Denies NSAIDs or alcohol. Hgb 10.5.  Past Medical History:  Diagnosis Date   Anal fissure - posterior 10/16/2014    OCC  ISSUES   Anxiety    doesn't take anything   Asthma    has Albuterol  inhaler as needed   Bronchitis    in winter   Chronic headache disorder 07/22/2016   Clostridium difficile infection 04/20/2012   Dehydration    Depression    Family history of adverse reaction to anesthesia    pt mom gets sick   GERD (gastroesophageal reflux disease)    takes Pantoprazole  daily   Headache    Hiatal hernia    neuropathy - mild in arms and legs   History of blood transfusion    last transfusion was 04/04/2016=Benadryl  was given d/t itching. States she always itches with transfusion.    History of migraine    last one 05/01/16   History of stomach ulcers    History of urinary tract infection LAST 2 WEEKS AGO   IDA (iron  deficiency anemia)    Internal and external bleeding hemorrhoids 06/11/2014   Joint pain    Joint swelling    Left sided chronic colitis - segmental 06/11/2014   Marginal ulcer 10/25/2017   Motion sickness    Nausea    takes Zofran  as needed   Nausea and vomiting    for 1 year   Oligouria    Osteoarthritis    lower back, knees,  wrists - no meds   Peripheral neuropathy 03/01/2019   Personal history of gastric bypass    Postoperative nausea and vomiting 01/21/2016   wants scopolamine  patch   Right carpal tunnel syndrome 03/01/2019   SVD (spontaneous vaginal delivery)    x 4   TOBACCO USER 10/02/2009   Qualifier: Diagnosis of  By: Lida MD, Artist     Tremor 08/31/2018   UC (ulcerative colitis) (HCC)    supposed to be taking Lialda  and Bentyl  but has been off since gastric sleeve   Unsteady gait when walking 2024   uses a walker   Vertigo    doesn't take any meds    Past Surgical History:  Procedure Laterality Date   ABDOMINAL HYSTERECTOMY     PARTIAL   BIOPSY  01/20/2023   Procedure: BIOPSY;  Surgeon: Saintclair Jasper, MD;  Location: [facility];  Service: Gastroenterology;;   BLADDER STIMULATOR IMPLANT     COLONOSCOPY  2007   for rectal bleeding; [facility]   COLONOSCOPY N/A 06/11/2014   Procedure: COLONOSCOPY;  Surgeon: Lupita FORBES Commander, MD;  Location: [facility];  Service: Endoscopy;  Laterality: N/A;   COLONOSCOPY WITH PROPOFOL  N/A 03/02/2017   Procedure: COLONOSCOPY WITH PROPOFOL ;  Surgeon: Ethyl Lenis, MD;  Location: [facility];  Service: General;  Laterality: N/A;  DILATION AND CURETTAGE OF UTERUS     ESOPHAGOGASTRODUODENOSCOPY N/A 07/25/2023   Procedure: ESOPHAGOGASTRODUODENOSCOPY (EGD);  Surgeon: Kriss Estefana DEL, DO;  Location: [facility];  Service: Gastroenterology;  Laterality: N/A;   ESOPHAGOGASTRODUODENOSCOPY N/A 10/02/2023   Procedure: ESOPHAGOGASTRODUODENOSCOPY (EGD);  Surgeon: Burnette Fallow, MD;  Location: [facility];  Service: Gastroenterology;  Laterality: N/A;   ESOPHAGOGASTRODUODENOSCOPY N/A 08/11/2024   Procedure: EGD (ESOPHAGOGASTRODUODENOSCOPY);  Surgeon: Saintclair Jasper, MD;  Location: [facility];  Service: Gastroenterology;  Laterality: N/A;   ESOPHAGOGASTRODUODENOSCOPY (EGD) WITH PROPOFOL  N/A 04/06/2016   Procedure: ESOPHAGOGASTRODUODENOSCOPY (EGD) WITH PROPOFOL ;  Surgeon:  Alm Angle, MD;  [facility]: [facility];  Service: General;  Laterality: N/A;   ESOPHAGOGASTRODUODENOSCOPY (EGD) WITH PROPOFOL  N/A 03/02/2017   Procedure: ESOPHAGOGASTRODUODENOSCOPY (EGD) WITH PROPOFOL ;  Surgeon: Angle Alm, MD;  Location: [facility];  Service: General;  Laterality: N/A;   ESOPHAGOGASTRODUODENOSCOPY (EGD) WITH PROPOFOL  N/A 05/20/2017   Procedure: ESOPHAGOGASTRODUODENOSCOPY (EGD) WITH PROPOFOL  ERAS PATHWAY;  Surgeon: Angle Alm, MD;  Location: [facility];  Service: General;  Laterality: N/A;   ESOPHAGOGASTRODUODENOSCOPY (EGD) WITH PROPOFOL  N/A 10/07/2017   Procedure: ESOPHAGOGASTRODUODENOSCOPY (EGD) WITH PROPOFOL ;  Surgeon: Angle Alm, MD;  Location: [facility];  Service: General;  Laterality: N/A;   ESOPHAGOGASTRODUODENOSCOPY (EGD) WITH PROPOFOL  N/A 01/20/2023   Procedure: ESOPHAGOGASTRODUODENOSCOPY (EGD) WITH PROPOFOL ;  Surgeon: Saintclair Jasper, MD;  [facility];  Service: Gastroenterology;  Laterality: N/A;   EXCISION OF SKIN TAG  06/08/2017   Procedure: EXCISION OF VULVAR SKIN TAGS X2;  Surgeon: Fredirick Glenys RAMAN, MD;  Location: [facility];  Service: Gynecology;;   FOOT SURGERY Bilateral    x 2   GASTRIC ROUX-EN-Y N/A 12/14/2016   Procedure: LAPAROSCOPIC REVISION SLEEVE GASTRECTOMY TO  ROUX-Y-GASTRIC BY-PASS, UPPER ENDO;  Surgeon: Morene Olives, MD;  Location: [facility];  Service: General;  Laterality: N/A;   GASTROJEJUNOSTOMY N/A 05/11/2016   Procedure: LAPAROSCOPIC PLACEMENT  OF FEEDING  JEJUNOSTOMY TUBE;  Surgeon: Morene Olives, MD;  [facility]: [facility];  Service: General;  Laterality: N/A;   HEMORRHOIDECTOMY WITH HEMORRHOID BANDING     HEMOSTASIS CLIP PLACEMENT  07/25/2023   Procedure: HEMOSTASIS CLIP PLACEMENT;  Surgeon: Kriss Estefana DEL, DO;  [facility]: [facility];  Service: Gastroenterology;;   HOT HEMOSTASIS N/A 07/25/2023   Procedure: HOT HEMOSTASIS (ARGON PLASMA COAGULATION/BICAP);  Surgeon: Kriss Estefana DEL, DO;  Location: [facility];   Service: Gastroenterology;  Laterality: N/A;   IR FLUORO GUIDE CV LINE RIGHT  04/06/2017   IR GASTROSTOMY TUBE MOD SED  08/25/2023   IR GENERIC HISTORICAL  05/19/2016   IR CM INJ ANY COLONIC TUBE W/FLUORO 05/19/2016 Marcey Moan, MD [facility]   IR PATIENT EVAL TECH 0-60 MINS  11/30/2017   IR REPLC DUODEN/JEJUNO TUBE PERCUT W/FLUORO  03/14/2018   IR REPLC DUODEN/JEJUNO TUBE PERCUT W/FLUORO  03/15/2018   IR US  GUIDE VASC ACCESS LEFT  08/25/2023   IR US  GUIDE VASC ACCESS LEFT  08/25/2023   IR US  GUIDE VASC ACCESS RIGHT  04/06/2017   j tube removed march 2018     KNEE ARTHROSCOPY Left 09/06/2014   LAPAROSCOPIC GASTRIC SLEEVE RESECTION N/A 01/14/2016   Procedure: LAPAROSCOPIC GASTRIC SLEEVE RESECTION;  Surgeon: Morene Olives, MD;  [facility];  Service: General;  Laterality: N/A;   LAPAROSCOPIC INSERTION GASTROSTOMY TUBE N/A 09/14/2023   Procedure: LAPAROSCOPIC REMNANT GASTROSTOMY TUBE PLACEMENT;  Surgeon: Lyndel Deward PARAS, MD;  [facility]: [facility];  Service: General;  Laterality: N/A;   LAPAROSCOPIC REVISION OF GASTROJEJUNOSTOMY N/A 10/25/2017   Procedure: LAPAROSCOPIC REVISION OF GASTROJEJUNOSTOMY AND  PARTIAL GASTRECTOMY, WITH PLACEMENT OF FEEDING GASTROSTOMY TUBE;  Surgeon: Mikell Katz, MD;  [facility]: [facility];  Service: General;  Laterality: N/A;   LAPAROSCOPIC TUBAL LIGATION  10/16/2011   Procedure: LAPAROSCOPIC TUBAL LIGATION;  Surgeon: Nena DELENA App, MD;  Location: [facility];  Service: Gynecology;  Laterality: Bilateral;   LAPAROSCOPY N/A 08/21/2024   Procedure: LAPAROSCOPY, DIAGNOSTIC, LAPAROSCOPIC GRAM PATCH OF ULCER;  Surgeon: Tanda Locus, MD;  [facility]: [facility];  Service: General;  Laterality: N/A;  DIAGNOSTIC LAPAROSCOPY   NOVASURE ABLATION  09/28/2010   mild persistent vaginal bleeding   right knee arthroscopy     05/07/2016   TOTAL KNEE ARTHROPLASTY Left 03/10/2018   Procedure: LEFT TOTAL KNEE ARTHROPLASTY;  Surgeon: Ernie Cough, MD;  Location: [facility];   Service: Orthopedics;  Laterality: Left;  70 mins   TOTAL KNEE ARTHROPLASTY Right 01/23/2020   Procedure: TOTAL KNEE ARTHROPLASTY, BILATERAL TROCHANTERIC INJECTION;  Surgeon: Ernie Cough, MD;  Location: [facility];  Service: Orthopedics;  Laterality: Right;  70 mins for Bilateral Troch injection 1 cc Depo and 2 CC Lidocaine    VAGINAL HYSTERECTOMY Bilateral 06/08/2017   Procedure: HYSTERECTOMY VAGINAL W/ BILATERAL SALPINGECTOMY;  Surgeon: Fredirick Glenys RAMAN, MD;  Location: [facility];  Service: Gynecology;  Laterality: Bilateral;   wisdom teeth extracted      Prior to Admission medications  Medication Sig Start Date End Date Taking? Authorizing Provider  ACCRUFER  30 MG CAPS Take 30 mg by mouth 2 (two) times daily.   Yes [provider]  albuterol  (VENTOLIN  HFA) 108 (90 Base) MCG/ACT inhaler Inhale 2 puffs into the lungs every 6 (six) hours as needed for wheezing or shortness of breath.   Yes [provider]  amitriptyline  (ELAVIL ) 25 MG tablet Take 50 mg by mouth at bedtime.   Yes [provider]  atorvastatin  (LIPITOR) 40 MG tablet Take 40 mg by mouth daily. 12/10/22  Yes [provider]  baclofen  (LIORESAL ) 10 MG tablet Take 10 mg by mouth 3 (three) times daily.   Yes [provider]  botulinum toxin Type A  (BOTOX ) 200 units injection INJECT 155 UNITS INTRAMUSCULARLY INTO HEAD AND NECK MUSCLES EVERY 3 MONTHS 11/23/22  Yes Onita Duos, MD  Buprenorphine  HCl 600 MCG FILM Place 600 mcg under the tongue in the morning and at bedtime.   Yes [provider]  buPROPion  (WELLBUTRIN  XL) 150 MG 24 hr tablet Take 150 mg by mouth in the morning.   Yes [provider]  buPROPion  (WELLBUTRIN  XL) 300 MG 24 hr tablet Take 300 mg by mouth in the morning. 06/05/20  Yes [provider]  busPIRone  (BUSPAR ) 15 MG tablet Take 15 mg by mouth 3 (three) times daily.   Yes [provider]  Cholecalciferol (VITAMIN D3 PO) Take 1 capsule by mouth daily.    Yes [provider]  clonazePAM  (KLONOPIN ) 1 MG tablet Take 1 mg by mouth 2 (two) times daily as needed for anxiety.   Yes [provider]  cyanocobalamin  (,VITAMIN B-12,) 1000 MCG/ML injection Inject 1,000 mcg into the muscle 2 (two) times a week.  04/06/19  Yes [provider]  diclofenac  Sodium (VOLTAREN ) 1 % GEL Apply 2 g topically 4 (four) times daily as needed (Pain).   Yes [provider]  dicyclomine  (BENTYL ) 10 MG capsule Take 10 mg by mouth 4 (four) times daily -  before meals and at bedtime.   Yes [provider]  DULoxetine  (CYMBALTA ) 60 MG capsule Take 1 capsule (60 mg total)  by mouth 2 (two) times daily. 04/10/19  Yes Wonda Clarita BRAVO, NP  EPINEPHrine  0.3 mg/0.3 mL IJ SOAJ injection Inject 0.3 mg into the muscle as needed for anaphylaxis. 04/12/21  Yes Eudelia Maude SAUNDERS, PA-C  famotidine  (PEPCID ) 20 MG tablet Take 20 mg by mouth 2 (two) times daily.   Yes [provider]  fluticasone  (FLONASE ) 50 MCG/ACT nasal spray Place 1-2 sprays into both nostrils daily as needed for allergies or rhinitis.   Yes [provider]  gabapentin  (NEURONTIN ) 600 MG tablet Take 900 mg by mouth 3 (three) times daily.   Yes [provider]  HYDROmorphone  (DILAUDID ) 4 MG tablet Take 4 mg by mouth every 4 (four) hours. 09/17/21  Yes [provider]  linaclotide  (LINZESS ) 290 MCG CAPS capsule Take 290 mcg by mouth daily before breakfast.   Yes [provider]  mesalamine  (LIALDA ) 1.2 g EC tablet Take 2.4 g by mouth 2 (two) times daily.   Yes [provider]  methocarbamol  (ROBAXIN ) 500 MG tablet Take 2 tablets (1,000 mg total) by mouth every 6 (six) hours as needed (use for muscle cramps/pain). Patient taking differently: Take 1,000 mg by mouth every 6 (six) hours as needed (for muscle cramps/pain). 08/30/24  Yes Augustus Almarie RAMAN, PA-C  midodrine  (PROAMATINE ) 10 MG tablet Take 1 tablet (10 mg total) by mouth 3  (three) times daily with meals. 08/17/24  Yes Odell Celinda Balo, MD  misoprostol  (CYTOTEC ) 200 MCG tablet Take 200 mcg by mouth daily.   Yes [provider]  Multiple Vitamin (MULTIVITAMIN WITH MINERALS) TABS tablet Take 1 tablet by mouth daily with breakfast.   Yes [provider]  NARCAN  4 MG/0.1ML LIQD nasal spray kit Place 1 spray into the nose daily as needed (overdose). 03/18/20  Yes [provider]  Nutritional Supplements (NUTREN 2.0) LIQD Take by mouth daily.   Yes [provider]  nystatin  (MYCOSTATIN ) 100000 UNIT/ML suspension Take 5 mLs by mouth See admin instructions. 5 ml's by mouth three times a day AS DIRECTED   Yes [provider]  omeprazole  (PRILOSEC ) 40 MG capsule Take 40 mg by mouth 2 (two) times daily before a meal.   Yes [provider]  ondansetron  (ZOFRAN -ODT) 8 MG disintegrating tablet Take 8 mg by mouth See admin instructions. Dissolve 8 mg in the mouth every eight hours 06/13/24  Yes [provider]  oxybutynin  (DITROPAN -XL) 5 MG 24 hr tablet Take 10 mg by mouth at bedtime. 08/16/24  Yes [provider]  PRESCRIPTION MEDICATION Take 400 mg by mouth See admin instructions. Magnesium  ES 400 mg SGC - Crush/dissolve 400 mg and take by mouth once a day   Yes [provider]  QULIPTA  30 MG TABS TAKE 1 TABLET BY MOUTH EVERY DAY Patient taking differently: Take 30 mg by mouth daily. 12/27/23  Yes Gayland Lauraine PARAS, NP  sucralfate  (CARAFATE ) 1 GM/10ML suspension Take 10 mLs (1 g total) by mouth 4 (four) times daily. 08/30/24 10/29/24 Yes Simaan, Almarie RAMAN, PA-C  SUMAtriptan  (IMITREX ) 50 MG tablet Take 50 mg by mouth daily as needed for migraine or headache (and may repeat once in 2 hours if headache persists or recurs).   Yes [provider]  SYMPROIC  0.2 MG TABS Take 0.2 mg by mouth daily before breakfast. 08/17/24  Yes [provider]  thiamine  (VITAMIN B-1) 100 MG tablet Take 100 mg by  mouth daily at 12 noon.    Yes [provider]  torsemide  (DEMADEX ) 5  MG tablet Take 5 mg by mouth daily.   Yes [provider]  Vitamin D , Ergocalciferol , (DRISDOL ) 1.25 MG (50000 UNIT) CAPS capsule Take 50,000 Units by mouth every Monday. 08/16/24  Yes [provider]  Nutritional Supplements (FEEDING SUPPLEMENT, OSMOLITE 1.5 CAL,) LIQD Place 1,000 mLs into feeding tube daily. Patient not taking: Reported on 10/02/2024 08/30/24   Augustus Almarie RAMAN, PA-C  Nystatin  (GERHARDT'S BUTT CREAM) CREA Apply 1 Application topically 3 (three) times daily. Patient not taking: Reported on 10/02/2024 08/17/24   Odell Celinda Balo, MD  pantoprazole  (PROTONIX ) 40 MG tablet Take 1 tablet (40 mg total) by mouth 2 (two) times daily before a meal. Crush and take twice daily Patient not taking: Reported on 10/02/2024 08/30/24 10/29/24  Augustus Almarie RAMAN, PA-C  valACYclovir  (VALTREX ) 1000 MG tablet Take 1,000 mg by mouth 2 (two) times daily. Patient not taking: Reported on 10/02/2024    [provider]    Scheduled Meds:  buPROPion   300 mg Oral q AM   DULoxetine   60 mg Oral BID   linaclotide   290 mcg Oral QAC breakfast   mesalamine   2.4 g Oral BID   midodrine   10 mg Oral TID WC   misoprostol   200 mcg Oral Daily   Naldemedine Tosylate   0.2 mg Oral QAC breakfast   pantoprazole  (PROTONIX ) IV  40 mg Intravenous Q12H   sodium chloride  flush  3 mL Intravenous Q12H   Continuous Infusions:  lactated ringers  125 mL/hr at 10/02/24 2139   PRN Meds:.acetaminophen  **OR** acetaminophen , clonazePAM , HYDROmorphone  (DILAUDID ) injection, methocarbamol , ondansetron  **OR** ondansetron  (ZOFRAN ) IV, sorbitol   Allergies as of 10/02/2024 - Review Complete 10/02/2024  Allergen Reaction Noted   Ciprofloxacin  Itching, Dermatitis, and Rash 04/20/2012   Coconut (cocos nucifera) Anaphylaxis and Itching 06/14/2017   Coconut oil Anaphylaxis and Itching 06/14/2017   Doxycycline Nausea And Vomiting  09/02/2011   Oxycodone Anaphylaxis 09/02/2011   Trazodone  and nefazodone Rash 04/10/2019   Hydroxyzine  Hives and Rash 08/03/2024   Nefazodone Rash 08/03/2024   Penicillins Hives, Rash, and Other (See Comments) 09/02/2011   Silicone Rash 06/11/2022   Tape Rash and Other (See Comments) 01/31/2017   Trazodone  Rash 08/03/2024   Wound dressing adhesive Rash 08/03/2024    Family History  Problem Relation Age of Onset   Hypertension Mother    Diabetes Mother    Sarcoidosis Mother        lungs and skin   Asthma Mother    Hypertension Father    Diabetes Father    Asthma Son    Tics Son    Asthma Sister    Cancer Sister        possible pancreatic cancer   Adrenal disorder Sister        Tumor    Asthma Brother    Asthma Daughter        died age 32.5   Cancer Daughter 4       brain; died age 32.5   Asthma Son    Cancer Paternal Aunt        brain, colon, lung and esophagus; unsure of primary    Breast cancer Paternal Aunt     Social History   Socioeconomic History   Marital status: Single    Spouse name: Not on file   Number of children: 3   Years of education: Some college   Highest education level: Not on file  Occupational History   Occupation: English As A Second Language Teacher    Comment: Currently out on disability  d/t surg  Tobacco Use   Smoking status: Former    Current packs/day: 0.00    Average packs/day: 0.5 packs/day for 20.0 years (10.0 ttl pk-yrs)    Types: Cigarettes    Start date: 03/17/1994    Quit date: 03/17/2014    Years since quitting: 10.5   Smokeless tobacco: Never  Vaping Use   Vaping status: Never Used  Substance and Sexual Activity   Alcohol use: Not Currently   Drug use: No   Sexual activity: Not Currently    Birth control/protection: Surgical, Abstinence  Other Topics Concern   Not on file  Social History Narrative   Lives with 2 sons   Right Handed   Drinks caffeine2-3 cups daily   Social Drivers of Health   Tobacco Use: Medium Risk (09/19/2024)    Received from [facility]   Patient History    Smoking Tobacco Use: Former    Smokeless Tobacco Use: Unknown    Passive Exposure: Not on file  Financial Resource Strain: Not on file  Food Insecurity: No Food Insecurity (08/21/2024)   Epic    Worried About Radiation Protection Practitioner of Food in the Last Year: Never true    Ran Out of Food in the Last Year: Never true  Transportation Needs: No Transportation Needs (08/21/2024)   Epic    Lack of Transportation (Medical): No    Lack of Transportation (Non-Medical): No  Physical Activity: Not on file  Stress: Not on file  Social Connections: Not on file  Intimate Partner Violence: Not At Risk (08/21/2024)   Epic    Fear of Current or Ex-Partner: No    Emotionally Abused: No    Physically Abused: No    Sexually Abused: No  Depression (PHQ2-9): High Risk (08/25/2023)   Depression (PHQ2-9)    PHQ-2 Score: 21  Alcohol Screen: Not on file  Housing: Low Risk (08/21/2024)   Epic    Unable to Pay for Housing in the Last Year: No    Number of Times Moved in the Last Year: 1    Homeless in the Last Year: No  Utilities: Not At Risk (08/21/2024)   Epic    Threatened with loss of utilities: No  Health Literacy: Not on file    Review of Systems: All negative except as stated above in HPI.  Physical Exam: Vital signs: Vitals:   10/03/24 0500 10/03/24 0731  BP: 109/70 127/84  Pulse: 79 77  Resp: 15 16  Temp:  98.5 F (36.9 C)  SpO2: 100% 100%     General:   Lethargic, thin, no acute distress, pleasant  Head: normocephalic, atraumatic Eyes: anicteric sclera ENT: oropharynx clear Neck: supple, nontender Lungs:  Clear throughout to auscultation.   No wheezes, crackles, or rhonchi. No acute distress. Heart:  Regular rate and rhythm; no murmurs, clicks, rubs,  or gallops. Abdomen: LUQ tenderness with guarding, soft, nondistended, +BS, PEG tube dressing noted  Rectal:  Deferred Ext: no edema  GI:  Lab Results: Recent Labs     10/02/24 1632 10/02/24 1648 10/02/24 1845  WBC 15.3*  --   --   HGB 10.6* 12.2 10.5*  HCT 34.6* 36.0 31.0*  PLT 417*  --   --    BMET Recent Labs    10/02/24 1648 10/02/24 1830 10/02/24 1845  NA 137 138 137  K 4.3 3.7 3.7  CL 100 104 101  CO2  --  27  --   GLUCOSE 131* 195* 179*  BUN  21* 19 21*  CREATININE 0.60 0.47 0.50  CALCIUM   --  8.9  --    LFT Recent Labs    10/02/24 1830  PROT 6.1*  ALBUMIN  3.2*  AST 13*  ALT 7  ALKPHOS 77  BILITOT <0.2   PT/INR Recent Labs    10/02/24 1632  LABPROT 13.4  INR 1.0      Impression/Plan: Hematemesis in setting of a chronic marginal ulcer that perforated in November with gram patch repair of perforation. EGD needed to further evaluate and if ulcer still present then needs to be evaluated for revision of anastomosis. NPO. Continue Protonix  40 mg IV Q 12 hours.    LOS: 1 day   [name] Sol  10/03/2024, 9:40 AM  Questions please call [phone]

## 2024-10-03 NOTE — Assessment & Plan Note (Signed)
 Continue midodrine 

## 2024-10-03 NOTE — Assessment & Plan Note (Signed)
 Will change her scheduled Dilaudid  to 1mg  IV instead of 4mg  PO She is on buprenorphine  twice daily in addition to Dilaudid  which her son will have to bring in Continue gabapentin , methocarbamol  (but not also baclofen )

## 2024-10-03 NOTE — Assessment & Plan Note (Signed)
"  Continue atorvastatin   "

## 2024-10-04 DIAGNOSIS — K922 Gastrointestinal hemorrhage, unspecified: Secondary | ICD-10-CM

## 2024-10-04 LAB — BASIC METABOLIC PANEL WITH GFR
Anion gap: 6 (ref 5–15)
BUN: 7 mg/dL (ref 6–20)
CO2: 28 mmol/L (ref 22–32)
Calcium: 9.1 mg/dL (ref 8.9–10.3)
Chloride: 106 mmol/L (ref 98–111)
Creatinine, Ser: 0.52 mg/dL (ref 0.44–1.00)
GFR, Estimated: >60 mL/min
Glucose, Bld: 89 mg/dL (ref 70–99)
Potassium: 4.1 mmol/L (ref 3.5–5.1)
Sodium: 140 mmol/L (ref 135–145)

## 2024-10-04 LAB — PHOSPHORUS: Phosphorus: 2.7 mg/dL (ref 2.5–4.6)

## 2024-10-04 LAB — CBC
HCT: 25.2 % — ABNORMAL LOW (ref 36.0–46.0)
Hemoglobin: 7.6 g/dL — ABNORMAL LOW (ref 12.0–15.0)
MCH: 28.6 pg (ref 26.0–34.0)
MCHC: 30.2 g/dL (ref 30.0–36.0)
MCV: 94.7 fL (ref 80.0–100.0)
Platelets: 323 K/uL (ref 150–400)
RBC: 2.66 MIL/uL — ABNORMAL LOW (ref 3.87–5.11)
RDW: 16.7 % — ABNORMAL HIGH (ref 11.5–15.5)
WBC: 9.2 K/uL (ref 4.0–10.5)
nRBC: 0 % (ref 0.0–0.2)

## 2024-10-04 LAB — GLUCOSE, CAPILLARY
Glucose-Capillary: 102 mg/dL — ABNORMAL HIGH (ref 70–99)
Glucose-Capillary: 70 mg/dL (ref 70–99)
Glucose-Capillary: 77 mg/dL (ref 70–99)
Glucose-Capillary: 81 mg/dL (ref 70–99)
Glucose-Capillary: 84 mg/dL (ref 70–99)
Glucose-Capillary: 94 mg/dL (ref 70–99)

## 2024-10-04 LAB — MAGNESIUM: Magnesium: 2.2 mg/dL (ref 1.7–2.4)

## 2024-10-04 MED ORDER — HYDROMORPHONE HCL 1 MG/ML IJ SOLN
2.0000 mg | INTRAMUSCULAR | Status: DC | PRN
  Administered 2024-10-04 – 2024-10-13 (×44): 2 mg via INTRAVENOUS
  Filled 2024-10-04 (×45): qty 2

## 2024-10-04 MED ORDER — MENTHOL 3 MG MT LOZG
1.0000 | LOZENGE | Freq: Once | OROMUCOSAL | Status: AC
  Administered 2024-10-04: 3 mg via ORAL
  Filled 2024-10-04: qty 9

## 2024-10-04 MED ORDER — ADULT MULTIVITAMIN LIQUID CH
15.0000 mL | Freq: Every day | ORAL | Status: DC
  Administered 2024-10-04 – 2024-10-06 (×3): 15 mL
  Filled 2024-10-04 (×3): qty 15

## 2024-10-04 MED ORDER — ENSURE MAX PROTEIN PO LIQD
11.0000 [oz_av] | Freq: Two times a day (BID) | ORAL | Status: DC
  Administered 2024-10-04 – 2024-10-15 (×23): 11 [oz_av] via ORAL
  Filled 2024-10-04 (×23): qty 330

## 2024-10-04 MED ORDER — HYDROMORPHONE HCL 2 MG PO TABS
4.0000 mg | ORAL_TABLET | Freq: Four times a day (QID) | ORAL | Status: DC
  Administered 2024-10-04 – 2024-10-14 (×33): 4 mg via ORAL
  Filled 2024-10-04 (×37): qty 2

## 2024-10-04 MED ORDER — SUMATRIPTAN SUCCINATE 50 MG PO TABS
50.0000 mg | ORAL_TABLET | ORAL | Status: AC | PRN
  Administered 2024-10-04 – 2024-10-05 (×2): 50 mg via ORAL
  Filled 2024-10-04 (×3): qty 1

## 2024-10-04 MED ORDER — OSMOLITE 1.5 CAL PO LIQD
1000.0000 mL | ORAL | Status: DC
  Administered 2024-10-04: 1000 mL
  Filled 2024-10-04 (×2): qty 1000

## 2024-10-04 MED ORDER — SODIUM CHLORIDE 0.9 % IV SOLN
200.0000 mg | Freq: Once | INTRAVENOUS | Status: AC
  Administered 2024-10-04: 200 mg via INTRAVENOUS
  Filled 2024-10-04: qty 200

## 2024-10-04 NOTE — Progress Notes (Signed)
 Initial Nutrition Assessment  DOCUMENTATION CODES:   Severe malnutrition in context of chronic illness  INTERVENTION:   1/7: -Run Osmolite 1.5 @ 40 ml/hr x 14 hours (1800-0800) via G-tube -Multivitamin with minerals daily via tube  1/8: -Advance to goal of Osmolite 1.5 @ 60 ml/hr x 14 hours (1800-0800) via G-tube -Provides 1260 kcals, 52g protein and 640 ml H2O   After discharge patient can resume home regimen of: -Nutren 2.0 @ 52 ml/hr x 14 hours via G-tube -Provides 1500 kcals, 63g protein and 519 ml H2O -Continue daily MVI via tube -Free water : 100 ml every 4 hours  -Ensure MAX Protein po BID as tolerated, each supplement provides 150 kcal and 30 grams of protein   NUTRITION DIAGNOSIS:   Severe Malnutrition related to chronic illness, altered GI function as evidenced by severe fat depletion, severe muscle depletion.  GOAL:   Patient will meet greater than or equal to 90% of their needs  MONITOR:   PO intake, Supplement acceptance, TF tolerance  REASON FOR ASSESSMENT:   Consult Enteral/tube feeding initiation and management  ASSESSMENT:   49yo with h/o remote complicated bariatric surgery resulting in recurrent UGI bleeding secondary to ulcers in the area of her GJ tube who presented on 1/5 with hematemesis.  She has chronic abdominal pain for which she takes opiates and chronic blood loss anemia for which she receives IV iron  infusions.  She had 8 episodes of coffee ground emesis in the ER on the say of presentation.  CT with thickened small bowel loops along the region distal to the gastric surgical anastomosis c/w enteritis.  1/6: EGD: non-bleeding gastric ulcers, GJ anastomosis with ulceration and visible sutures  Patient in room, feeling okay.  Tolerated Osmolite 1.5 at 20 ml/hr overnight.  Still reports her tube leaks. At home she has been administering 3 cartons of Nutren 2.0 and running this overnight at 50 ml/hr. At times has run it at 65 but better  tolerates 50 ml/hr d/t leaking.  Now on full liquids and plans to order something to eat soon. At home drinks Premier Protein and Fairlife shakes. Okay to drink Ensure Max here. Reports she has been administering vitamins via tube. Will start liquid MVI via tube while here.  Admission weight:  122 lbs Current weight: 124 lbs  Medications: Ferrous sulfate , Linzess , Carafate , iron  sucrose, Zofran   Labs reviewed: CBGs: 70-77  NUTRITION - FOCUSED PHYSICAL EXAM:  Flowsheet Row Most Recent Value  Orbital Region Moderate depletion  Upper Arm Region Severe depletion  Thoracic and Lumbar Region Moderate depletion  Buccal Region Severe depletion  Temple Region Severe depletion  Clavicle Bone Region Severe depletion  Clavicle and Acromion Bone Region Severe depletion  Scapular Bone Region Severe depletion  Dorsal Hand Severe depletion  Patellar Region Moderate depletion  Anterior Thigh Region Moderate depletion  Posterior Calf Region Moderate depletion  Edema (RD Assessment) None  Hair Reviewed  Eyes Reviewed  Mouth Reviewed  Skin Reviewed  Nails Reviewed    Diet Order:   Diet Order             Diet full liquid Fluid consistency: Thin  Diet effective now                   EDUCATION NEEDS:   Not appropriate for education at this time  Skin:  Skin Assessment: Reviewed RN Assessment  Last BM:  PTA  Height:   Ht Readings from Last 1 Encounters:  10/03/24 5' 7 (1.702 m)  Weight:   Wt Readings from Last 1 Encounters:  10/04/24 56.5 kg    BMI:  Body mass index is 19.51 kg/m.  Estimated Nutritional Needs:   Kcal:  1700-1900  Protein:  85-95g  Fluid:  1.9L/day  Morna Lee, MS, RD, LDN Inpatient Clinical Dietitian Contact via Secure chat

## 2024-10-04 NOTE — TOC Initial Note (Signed)
 Transition of Care [facility]) - Initial/Assessment Note    Patient Details  Name: [name] MRN: [id number] Date of Birth: Nov 07, 1975  Transition of Care [facility]) CM/SW Contact:    Bascom Service, RN Phone Number: 10/04/2024, 3:16 PM  Clinical Narrative: Spoke to patient about d/c plans-She states she receives GJT formula from [employer] rep Pam aware, she orders supplies on [employer].Unsure of HHC agency for HHPT/OT-will check. Has own transport home.                  Expected Discharge Plan: Home w Home Health Services Barriers to Discharge: Continued Medical Work up   Patient Goals and CMS Choice Patient states their goals for this hospitalization and ongoing recovery are:: Home CMS Medicare.gov Compare Post Acute Care list provided to:: Patient Choice offered to / list presented to : Patient [facility] ownership interest in [facility].provided to:: Patient    Expected Discharge Plan and Services   Discharge Planning Services: CM Consult Post Acute Care Choice: Home Health Living arrangements for the past 2 months: Single Family Home                                      Prior Living Arrangements/Services Living arrangements for the past 2 months: Single Family Home Lives with:: Adult Children   Do you feel safe going back to the place where you live?: Yes          Current home services: DME    Activities of Daily Living   ADL Screening (condition at time of admission) Independently performs ADLs?: Yes (appropriate for developmental age) Is the patient deaf or have difficulty hearing?: No Does the patient have difficulty seeing, even when wearing glasses/contacts?: No Does the patient have difficulty concentrating, remembering, or making decisions?: No  Permission Sought/Granted Permission sought to share information with : Case Manager Permission granted to share information with : Yes, Verbal Permission Granted              Emotional  Assessment              Admission diagnosis:  Enteritis [K52.9] UGI bleed [K92.2] Hematemesis with nausea [K92.0] Patient Active Problem List   Diagnosis Date Noted   UGI bleed 10/02/2024   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) 08/30/2024   IDA (iron  deficiency anemia) 08/30/2024   Folate deficiency anemia 08/24/2024   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 08/24/2024   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 08/21/2024 08/21/2024   Hematemesis 08/10/2024   Nausea & vomiting 08/09/2024   Chronic pain 08/09/2024   TIA (transient ischemic attack) 08/03/2024   Acute upper GI bleed 10/01/2023   ABLA (acute blood loss anemia) 10/01/2023   Hyperlipidemia 10/01/2023   Feeding intolerance 09/14/2023   Orthostatic hypotension 08/04/2023   Upper GI bleed 07/25/2023   Abdominal pain with vomiting 01/26/2023   Depression with anxiety 01/26/2023   Enteritis 01/26/2023   Malnutrition of moderate degree 01/20/2023   Normocytic anemia 01/20/2023   Intractable nausea and vomiting 01/18/2023   Intractable abdominal pain 01/17/2023   Pituitary microadenoma (HCC) 06/11/2022   Status post implantation of urinary electronic stimulator device 01/13/2022   Yeast infection 02/07/2020   History of urinary urgency 02/07/2020   Urinary frequency 02/07/2020   Dysuria 02/07/2020   Chronic migraine without aura, with intractable migraine, so stated, with status migrainosus 02/05/2020  S/P total knee arthroplasty, right 01/23/2020   Status post total knee replacement, right 01/23/2020   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   Chronic low back pain 10/25/2019   Headache 05/04/2019   Gait instability 05/04/2019   Suicidal ideations    MDD (major depressive disorder), recurrent severe, 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   Peripheral neuropathy 03/01/2019   Right carpal tunnel syndrome 03/01/2019   Chronic constipation 01/26/2019    Status post total left knee replacement 03/10/2018   Degeneration of lumbar intervertebral disc 02/12/2018   Osteoarthritis of left knee 02/10/2018   B12 deficiency 12/01/2017   Protein-calorie malnutrition, moderate 03/27/2017   Psychophysiological insomnia 03/18/2017   Paresthesia 07/22/2016   Chronic headache disorder 07/22/2016   Memory difficulty 07/22/2016   Upper GI bleeding 04/04/2016   Anemia, iron  deficiency 10/01/2015   Left sided chronic colitis - segmental 06/11/2014   Pain of upper abdomen 04/26/2012   Asthma 04/26/2012   Hypokalemia 04/26/2012   Reactive depression 10/02/2009   History of gastrojejunal ulcer 10/02/2009   PCP:  Orion Kerns, MD Pharmacy:   [facility] - [place], [place] - [address] [place] [address] Phone: [phone] Fax: [phone]  [id number] - [place],  - [address] AT [facility] [address] [place] [address] Phone: [phone] Fax: [phone]     Social Drivers of Health (SDOH) Social History: SDOH Screenings   Food Insecurity: No Food Insecurity (10/03/2024)  Housing: Low Risk (10/03/2024)  Transportation Needs: No Transportation Needs (10/03/2024)  Utilities: Not At Risk (10/03/2024)  Depression (PHQ2-9): High Risk (08/25/2023)  Tobacco Use: Medium Risk (10/03/2024)   SDOH Interventions:     Readmission Risk Interventions    08/22/2024    3:28 PM 08/16/2024    1:26 PM 09/15/2023   11:43 AM  Readmission Risk Prevention Plan  Transportation Screening Complete Complete Complete  PCP or Specialist Appt within 3-5 Days Complete Complete Complete  HRI or Home Care Consult Complete Complete Complete  Social Work Consult for Recovery Care Planning/Counseling Complete Complete Complete  Palliative Care Screening Not Applicable Not Applicable Not Applicable  Medication Review Oceanographer) Complete Complete Complete

## 2024-10-04 NOTE — Progress Notes (Signed)
 [facility] Gastroenterology Progress Note  [name] 49 y.o. [birth year]   Subjective: Doing fair. Tolerating clear liquids.  Objective: Vital signs: Vitals:   10/04/24 0445 10/04/24 1037  BP: 122/83 113/73  Pulse: 76   Resp: 18   Temp: 98.2 F (36.8 C)   SpO2: 100%     Physical Exam: Gen: lethargic, thin, no acute distress  HEENT: anicteric sclera CV: RRR Chest: CTA B Abd: diffuse tenderness with guarding, +BS, PEG tube Ext: no edema  Lab Results: Recent Labs    10/03/24 1546 10/04/24 0531  NA 133* 140  K 4.1 4.1  CL 100 106  CO2 25 28  GLUCOSE 121* 89  BUN 9 7  CREATININE 0.52 0.52  CALCIUM  9.3 9.1  MG  --  2.2  PHOS  --  2.7   Recent Labs    10/02/24 1830  AST 13*  ALT 7  ALKPHOS 77  BILITOT <0.2  PROT 6.1*  ALBUMIN  3.2*   Recent Labs    10/02/24 1632 10/02/24 1648 10/03/24 1546 10/04/24 0531  WBC 15.3*  --  9.0 9.2  NEUTROABS 12.7*  --   --   --   HGB 10.6*   < > 8.7* 7.6*  HCT 34.6*   < > 27.6* 25.2*  MCV 93.8  --  93.2 94.7  PLT 417*  --  396 323   < > = values in this interval not displayed.      Assessment/Plan: Chronic anastomotic ulcer - likely needs surgical revision of anastomosis. Doubt she can advance beyond soft diet without further bleeding. Will need surgery at [facility] main if needed and surgery note from yesterday noted. Full liquid diet today. Continue IV PPI Q 12 hours and change to PO BID tomorrow if stable. Will follow.    [name] Sol 10/04/2024, 11:47 AM  Questions please call [phone] ID: [name], female   DOB: Feb 04, 1976, 49 y.o.   MRN: [id number]

## 2024-10-04 NOTE — Progress Notes (Addendum)
 "   1 Day Post-Op  Subjective: No further hematemesis.  Would like a picc and an iron  infusion   Objective: Vital signs in last 24 hours: Temp:  [97.4 F (36.3 C)-98.4 F (36.9 C)] 98.2 F (36.8 C) (01/07 0445) Pulse Rate:  [58-89] 76 (01/07 0445) Resp:  [10-20] 18 (01/07 0445) BP: (109-123)/(63-83) 113/73 (01/07 1037) SpO2:  [99 %-100 %] 100 % (01/07 0445) Weight:  [55.7 kg-56.5 kg] 56.5 kg (01/07 0500)    Intake/Output from previous day: 01/06 0701 - 01/07 0700 In: 2797 [P.O.:240; I.V.:2350; NG/GT:207] Out: 0  Intake/Output this shift: No intake/output data recorded.  PE: Gen: NAD, sitting up in bed Lungs: respiratory effort unlabored Abd: soft, mild chronic tenderness to her abdominal wall, g-tube in place and clamped off, ND  Lab Results:  Recent Labs    10/03/24 1546 10/04/24 0531  WBC 9.0 9.2  HGB 8.7* 7.6*  HCT 27.6* 25.2*  PLT 396 323   BMET Recent Labs    10/03/24 1546 10/04/24 0531  NA 133* 140  K 4.1 4.1  CL 100 106  CO2 25 28  GLUCOSE 121* 89  BUN 9 7  CREATININE 0.52 0.52  CALCIUM  9.3 9.1   PT/INR Recent Labs    10/02/24 1632  LABPROT 13.4  INR 1.0   CMP     Component Value Date/Time   NA 140 10/04/2024 0531   NA 137 06/11/2022 1620   K 4.1 10/04/2024 0531   CL 106 10/04/2024 0531   CO2 28 10/04/2024 0531   GLUCOSE 89 10/04/2024 0531   BUN 7 10/04/2024 0531   BUN 20 06/11/2022 1620   CREATININE 0.52 10/04/2024 0531   CREATININE 0.88 06/05/2016 1003   CALCIUM  9.1 10/04/2024 0531   PROT 6.1 (L) 10/02/2024 1830   PROT 7.4 06/11/2022 1620   ALBUMIN  3.2 (L) 10/02/2024 1830   ALBUMIN  4.7 06/11/2022 1620   AST 13 (L) 10/02/2024 1830   ALT 7 10/02/2024 1830   ALKPHOS 77 10/02/2024 1830   BILITOT <0.2 10/02/2024 1830   BILITOT 0.4 06/11/2022 1620   GFRNONAA >60 10/04/2024 0531   GFRNONAA 84 06/05/2016 1003   GFRAA >60 01/24/2020 0253   GFRAA >89 06/05/2016 1003   Lipase     Component Value Date/Time   LIPASE 15  08/21/2024 0140       Studies/Results: CT ABDOMEN PELVIS W CONTRAST Result Date: 10/02/2024 CLINICAL DATA:  Hematemesis with fatigue and weakness. EXAM: CT ABDOMEN AND PELVIS WITH CONTRAST TECHNIQUE: Multidetector CT imaging of the abdomen and pelvis was performed using the standard protocol following bolus administration of intravenous contrast. RADIATION DOSE REDUCTION: This exam was performed according to the departmental dose-optimization program which includes automated exposure control, adjustment of the mA and/or kV according to patient size and/or use of iterative reconstruction technique. CONTRAST:  OMNIPAQUE  IOHEXOL  300 MG/ML  SOLN COMPARISON:  September 23, 2024 FINDINGS: Lower chest: No acute abnormality. Hepatobiliary: There is diffuse fatty infiltration of the liver parenchyma. No focal liver abnormality is seen. Status post cholecystectomy. No biliary dilatation. Pancreas: Unremarkable. No pancreatic ductal dilatation or surrounding inflammatory changes. Spleen: A 2.5 cm well-defined area of parenchymal low attenuation is seen within the anterior aspect of an otherwise normal-appearing spleen. Adrenals/Urinary Tract: Adrenal glands are unremarkable. Kidneys are normal, without renal calculi, focal lesion, or hydronephrosis. Urinary bladder is poorly distended and is otherwise unremarkable. Stomach/Bowel: Surgical sutures are seen throughout the gastric region with surgically anastomosed bowel noted within the mid left abdomen.  A percutaneous gastrostomy tube is in place with surgical sutures seen adjacent to the site of insufflator bulb insertion. Appendix appears normal. No evidence of bowel dilatation. Moderate to markedly thickened, poorly distended small bowel loops are seen along the region distal to the gastric surgical anastomosis. Vascular/Lymphatic: No significant vascular findings are present. No enlarged abdominal or pelvic lymph nodes. Reproductive: Status post hysterectomy. No  adnexal masses. Other: No abdominal wall hernia or abnormality. No abdominopelvic ascites. Musculoskeletal: No acute or significant osseous findings. IMPRESSION: 1. Moderate to markedly thickened, poorly distended small bowel loops along the region distal to the gastric surgical anastomosis, which may represent sequelae associated with enteritis. 2. Hepatic steatosis. 3. Evidence of prior cholecystectomy and hysterectomy. 4. Percutaneous gastrostomy tube in place. Electronically Signed   By: Suzen Dials M.D.   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 Tanda 08/21/24 -EGD reviewed.  Multiple known ulcers noted.  No active bleeding or stigmata of bleeding noted currently.  If she were to have more bleeding, she would require transfer to [facility] for the surgeons there to reverse her bypass, likely requiring an esophagogastrectomy due to how little stomach she has remaining given she had a sleeve prior to a bypass. -for now she is not bleeding and HDS -agree with some iron  to help with her anemia -nightly TFs -she may have an oral diet from our standpoint as she tolerates.  She normally isn't able to take much -BID PPI and carafate .  When converted to oral protonix , will need this crushed and not whole when taken -no acute surgical needs at this time.  We will be available as needed. -she has follow up in our office already arranged for next week -d/w primary service  FEN - FLD VTE - on hold due to recent UGI bleed ID - none currently needed    LOS: 2 days    Burnard FORBES Banter , [facility] 10/04/2024, 12:17 PM Please see Amion for pager number during day hours 7:00am-4:30pm or 7:00am -11:30am on weekends  "

## 2024-10-04 NOTE — Progress Notes (Signed)
 " PROGRESS NOTE    [name]  FMW:[id number] DOB: Mar 15, 1976 DOA: 10/02/2024 PCP: Orion Kerns, MD   Brief Narrative:  [phone] with h/o remote complicated bariatric surgery resulting in recurrent UGI bleeding secondary to ulcers in the area of her GJ tube, chronic abdominal pain on opiates, chronic blood loss anemia requiring IV iron  infusions presented with coffee-ground emesis.  CT of the abdomen showed thickened small bowel loops along the region distal to the gastric surgical anastomosis consistent with enteritis.  She was started on IV PPI.  GI and general surgery were consulted.  She underwent EGD on 10/03/2024.  Assessment & Plan:   Upper GI bleeding Acute on chronic blood loss anemia Chronic marginal ulcer with perforation status post Arlyss omental repair on 08/21/2024 -Five weeks s/p surgical repair of perforated stomach ulcer. - Hemoglobin continues to trend downwards: 7.6 this morning.  No overt signs of bleeding since admission.  Monitor H&H.  Continue IV PPI along with oral sucralfate . - Status post EGD on 10/03/2024: Showed acute  gastrojejunal anastomosis with ulceration and visible stuures; pouch-to-jejunum limb with ulceration and jejunojejunal anastomosis also with ulceration; 3 non-bleeding cratered gastric ulcers with pigmented material at anastomosis EGD reported chronic marginal ulcer that would need surgical evaluation for revision of anastomosis - General Surgery evaluation appreciated: Currently no need for any surgical intervention but if GI bleeding recurs, patient might need transfer to [facility]. - Diet advancement as per GI and general surgery. 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  Outpatient follow-up with pain management provided  Chronic constipation -Continue Linzess  and Symproic   Orthostatic hypotension -continue midodrine   DVT prophylaxis: SCDs Code Status: Full Family Communication: None at bedside Disposition Plan: Status is: Inpatient Remains inpatient appropriate because: Of severity of illness    Consultants: GI/general surgery  Procedures: As above  Antimicrobials: None   Subjective: Patient seen and examined at bedside.  Complains of constipation along with intermittent abdominal pain with nausea.  No vomiting, fever or shortness of breath reported.  Objective: Vitals:   10/03/24 2359 10/04/24 0445 10/04/24 0500 10/04/24 1037  BP: 109/69 122/83  113/73  Pulse: 84 76    Resp: 19 18    Temp: (!) 97.4 F (36.3 C) 98.2 F (36.8 C)    TempSrc: Oral Oral    SpO2: 100% 100%    Weight:   56.5 kg   Height:        Intake/Output Summary (Last 24 hours) at 10/04/2024 1053 Last data filed at 10/04/2024 0441 Gross per 24 hour  Intake 2797 ml  Output 0 ml  Net 2797 ml   Filed Weights   10/03/24 1526 10/04/24 0500  Weight: 55.7 kg 56.5 kg    Examination:  General exam: Appears calm and comfortable  Respiratory system: Bilateral decreased breath sounds at bases Cardiovascular system: S1 & S2 heard, Rate controlled Gastrointestinal system: Abdomen is nondistended, soft and mildly tender in the upper quadrant. Normal bowel sounds heard.  G-tube in place. Extremities: No cyanosis, clubbing, edema  Central nervous system: Alert and oriented. No focal neurological deficits. Moving extremities Skin: No rashes, lesions or ulcers Psychiatry: Flat affect.  Not agitated.  Data Reviewed: I have personally reviewed following labs and imaging studies  CBC: Recent Labs  Lab  10/02/24 1632 10/02/24 1648 10/02/24 1845 10/03/24 1546 10/04/24 0531  WBC 15.3*  --   --  9.0 9.2  NEUTROABS 12.7*  --   --   --   --   HGB 10.6* 12.2 10.5* 8.7* 7.6*  HCT 34.6* 36.0 31.0* 27.6* 25.2*  MCV 93.8  --   --  93.2 94.7  PLT 417*  --   --  396 323   Basic Metabolic Panel: Recent Labs  Lab 10/02/24 1648 10/02/24 1830 10/02/24 1845 10/03/24 1546 10/04/24 0531  NA 137 138 137 133* 140  K 4.3 3.7 3.7 4.1 4.1  CL 100 104 101 100 106  CO2  --  27  --  25 28  GLUCOSE 131* 195* 179* 121* 89  BUN 21* 19 21* 9 7  CREATININE 0.60 0.47 0.50 0.52 0.52  CALCIUM   --  8.9  --  9.3 9.1  MG  --   --   --   --  2.2  PHOS  --   --   --   --  2.7   GFR: Estimated Creatinine Clearance: 78.4 mL/min (by C-G formula based on SCr of 0.52 mg/dL). Liver Function Tests: Recent Labs  Lab 10/02/24 1830  AST 13*  ALT 7  ALKPHOS 77  BILITOT <0.2  PROT 6.1*  ALBUMIN  3.2*   No results for input(s): LIPASE, AMYLASE in the last 168 hours. No results for input(s): AMMONIA in the last 168 hours. Coagulation Profile: Recent Labs  Lab 10/02/24 1632  INR 1.0   Cardiac Enzymes: No results for input(s): CKTOTAL, CKMB, CKMBINDEX, TROPONINI in the last 168 hours. BNP (last 3 results) No results for input(s): PROBNP in the last 8760 hours. HbA1C: No results for input(s): HGBA1C in the last 72 hours. CBG: Recent Labs  Lab 10/03/24 1730 10/03/24 2005 10/03/24 2357 10/04/24 0440 10/04/24 0725  GLUCAP 114* 121* 108* 81 70   Lipid Profile: No results for input(s): CHOL, HDL, LDLCALC, TRIG, CHOLHDL, LDLDIRECT in the last 72 hours. Thyroid  Function Tests: No results for input(s): TSH, T4TOTAL, FREET4, T3FREE, THYROIDAB in the last 72 hours. Anemia Panel: No results for input(s): VITAMINB12, FOLATE, FERRITIN, TIBC, IRON , RETICCTPCT in the last 72 hours. Sepsis Labs: Recent Labs  Lab 10/02/24 1648 10/02/24 1842  LATICACIDVEN 1.4  1.0    No results found for this or any previous visit (from the past 240 hours).       Radiology Studies: CT ABDOMEN PELVIS W CONTRAST Result Date: 10/02/2024 CLINICAL DATA:  Hematemesis with fatigue and weakness. EXAM: CT ABDOMEN AND PELVIS WITH CONTRAST TECHNIQUE: Multidetector CT imaging of the abdomen and pelvis was performed using the standard protocol following bolus administration of intravenous contrast. RADIATION DOSE REDUCTION: This exam was performed according to the departmental dose-optimization program which includes automated exposure control, adjustment of the mA and/or kV according to patient size and/or use of iterative reconstruction technique. CONTRAST:  OMNIPAQUE  IOHEXOL  300 MG/ML  SOLN COMPARISON:  September 23, 2024 FINDINGS: Lower chest: No acute abnormality. Hepatobiliary: There is diffuse fatty infiltration of the liver parenchyma. No focal liver abnormality is seen. Status post cholecystectomy. No biliary dilatation. Pancreas: Unremarkable. No pancreatic ductal dilatation or surrounding inflammatory changes. Spleen: A 2.5 cm well-defined area of parenchymal low attenuation is seen within the anterior aspect of an otherwise normal-appearing spleen. Adrenals/Urinary Tract: Adrenal glands are unremarkable. Kidneys are normal, without renal  calculi, focal lesion, or hydronephrosis. Urinary bladder is poorly distended and is otherwise unremarkable. Stomach/Bowel: Surgical sutures are seen throughout the gastric region with surgically anastomosed bowel noted within the mid left abdomen. A percutaneous gastrostomy tube is in place with surgical sutures seen adjacent to the site of insufflator bulb insertion. Appendix appears normal. No evidence of bowel dilatation. Moderate to markedly thickened, poorly distended small bowel loops are seen along the region distal to the gastric surgical anastomosis. Vascular/Lymphatic: No significant vascular findings are present. No enlarged  abdominal or pelvic lymph nodes. Reproductive: Status post hysterectomy. No adnexal masses. Other: No abdominal wall hernia or abnormality. No abdominopelvic ascites. Musculoskeletal: No acute or significant osseous findings. IMPRESSION: 1. Moderate to markedly thickened, poorly distended small bowel loops along the region distal to the gastric surgical anastomosis, which may represent sequelae associated with enteritis. 2. Hepatic steatosis. 3. Evidence of prior cholecystectomy and hysterectomy. 4. Percutaneous gastrostomy tube in place. Electronically Signed   By: Suzen Dials M.D.   On: 10/02/2024 19:32        Scheduled Meds:  amitriptyline   50 mg Oral QHS   atorvastatin   40 mg Oral Daily   buPROPion   450 mg Oral Daily   busPIRone   15 mg Oral TID   DULoxetine   60 mg Oral BID   ferrous sulfate   325 mg Oral Q breakfast   gabapentin   900 mg Oral TID   HYDROmorphone   4 mg Oral Q6H   linaclotide   290 mcg Oral QAC breakfast   mesalamine   2.4 g Oral BID   midodrine   10 mg Oral TID WC   misoprostol   200 mcg Oral Daily   Naldemedine Tosylate   0.2 mg Oral QAC breakfast   oxybutynin   10 mg Oral QHS   pantoprazole  (PROTONIX ) IV  40 mg Intravenous Q12H   sodium chloride  flush  3 mL Intravenous Q12H   sucralfate   1 g Oral TID WC & HS   Continuous Infusions:  feeding supplement (OSMOLITE 1.5 CAL) Stopped (10/04/24 0603)          Sophie Mao, MD [facility] 10/04/2024, 10:53 AM   "

## 2024-10-05 ENCOUNTER — Telehealth (HOSPITAL_COMMUNITY): Payer: Self-pay | Admitting: Internal Medicine

## 2024-10-05 ENCOUNTER — Inpatient Hospital Stay (HOSPITAL_COMMUNITY)

## 2024-10-05 DIAGNOSIS — K922 Gastrointestinal hemorrhage, unspecified: Secondary | ICD-10-CM | POA: Diagnosis not present

## 2024-10-05 LAB — CBC WITH DIFFERENTIAL/PLATELET
Abs Immature Granulocytes: 0.01 K/uL (ref 0.00–0.07)
Basophils Absolute: 0 K/uL (ref 0.0–0.1)
Basophils Relative: 1 %
Eosinophils Absolute: 0.2 K/uL (ref 0.0–0.5)
Eosinophils Relative: 3 %
HCT: 23.8 % — ABNORMAL LOW (ref 36.0–46.0)
Hemoglobin: 7.2 g/dL — ABNORMAL LOW (ref 12.0–15.0)
Immature Granulocytes: 0 %
Lymphocytes Relative: 41 %
Lymphs Abs: 2.6 K/uL (ref 0.7–4.0)
MCH: 29.4 pg (ref 26.0–34.0)
MCHC: 30.3 g/dL (ref 30.0–36.0)
MCV: 97.1 fL (ref 80.0–100.0)
Monocytes Absolute: 0.5 K/uL (ref 0.1–1.0)
Monocytes Relative: 7 %
Neutro Abs: 3.1 K/uL (ref 1.7–7.7)
Neutrophils Relative %: 48 %
Platelets: 306 K/uL (ref 150–400)
RBC: 2.45 MIL/uL — ABNORMAL LOW (ref 3.87–5.11)
RDW: 16.8 % — ABNORMAL HIGH (ref 11.5–15.5)
WBC: 6.4 K/uL (ref 4.0–10.5)
nRBC: 0 % (ref 0.0–0.2)

## 2024-10-05 LAB — GLUCOSE, CAPILLARY
Glucose-Capillary: 86 mg/dL (ref 70–99)
Glucose-Capillary: 82 mg/dL (ref 70–99)
Glucose-Capillary: 81 mg/dL (ref 70–99)
Glucose-Capillary: 57 mg/dL — ABNORMAL LOW (ref 70–99)
Glucose-Capillary: 81 mg/dL (ref 70–99)
Glucose-Capillary: 85 mg/dL (ref 70–99)

## 2024-10-05 LAB — BASIC METABOLIC PANEL WITH GFR
Anion gap: 3 — ABNORMAL LOW (ref 5–15)
BUN: 8 mg/dL (ref 6–20)
CO2: 31 mmol/L (ref 22–32)
Calcium: 8.8 mg/dL — ABNORMAL LOW (ref 8.9–10.3)
Chloride: 106 mmol/L (ref 98–111)
Creatinine, Ser: 0.65 mg/dL (ref 0.44–1.00)
GFR, Estimated: >60 mL/min
Glucose, Bld: 100 mg/dL — ABNORMAL HIGH (ref 70–99)
Potassium: 4.3 mmol/L (ref 3.5–5.1)
Sodium: 140 mmol/L (ref 135–145)

## 2024-10-05 LAB — MAGNESIUM: Magnesium: 2.2 mg/dL (ref 1.7–2.4)

## 2024-10-05 LAB — PHOSPHORUS: Phosphorus: 3.9 mg/dL (ref 2.5–4.6)

## 2024-10-05 MED ORDER — SENNOSIDES-DOCUSATE SODIUM 8.6-50 MG PO TABS
1.0000 | ORAL_TABLET | Freq: Two times a day (BID) | ORAL | Status: DC
  Administered 2024-10-05 – 2024-10-14 (×20): 1 via ORAL
  Filled 2024-10-05 (×21): qty 1

## 2024-10-05 MED ORDER — OSMOLITE 1.5 CAL PO LIQD
1000.0000 mL | ORAL | Status: DC
  Administered 2024-10-05: 1000 mL
  Filled 2024-10-05 (×2): qty 1000

## 2024-10-05 MED ORDER — SODIUM CHLORIDE 0.9% FLUSH
10.0000 mL | INTRAVENOUS | Status: DC | PRN
  Administered 2024-10-05: 10 mL

## 2024-10-05 MED ORDER — BISACODYL 10 MG RE SUPP: 10.0000 mg | Freq: Every day | RECTAL | Status: DC | PRN

## 2024-10-05 MED ORDER — IOHEXOL 350 MG/ML SOLN
30.0000 mL | Freq: Once | INTRAVENOUS | Status: AC | PRN
  Administered 2024-10-05: 30 mL

## 2024-10-05 MED ORDER — BACLOFEN 10 MG PO TABS
10.0000 mg | ORAL_TABLET | Freq: Three times a day (TID) | ORAL | Status: DC
  Administered 2024-10-05 – 2024-10-15 (×32): 10 mg via ORAL
  Filled 2024-10-05 (×32): qty 1

## 2024-10-05 NOTE — Progress Notes (Signed)
 Nutrition Follow-up  DOCUMENTATION CODES:   Severe malnutrition in context of chronic illness  INTERVENTION:   1/8: -Advance to Osmolite 1.5 @ 50 ml/hr x 14 hours (1800-0800) via G-tube  1/9: -Advance to Osmolite 1.5 @ 60 ml/hr x 14 hours (1800-0800) via G-tube -Provides 1260 kcals, 52g protein and 640 ml H2O    After discharge patient can resume home regimen of: -Nutren 2.0 @ 52 ml/hr x 14 hours via G-tube -Provides 1500 kcals, 63g protein and 519 ml H2O -Continue daily MVI via tube -Free water : 100 ml every 4 hours   -Ensure MAX Protein po BID as tolerated, each supplement provides 150 kcal and 30 grams of protein   NUTRITION DIAGNOSIS:   Severe Malnutrition related to chronic illness, altered GI function as evidenced by severe fat depletion, severe muscle depletion.  Ongoing.  GOAL:   Patient will meet greater than or equal to 90% of their needs  Progressing.  MONITOR:   PO intake, Supplement acceptance, TF tolerance  ASSESSMENT:   48yo with h/o remote complicated bariatric surgery resulting in recurrent UGI bleeding secondary to ulcers in the area of her GJ tube who presented on 1/5 with hematemesis.  She has chronic abdominal pain for which she takes opiates and chronic blood loss anemia for which she receives IV iron  infusions.  She had 8 episodes of coffee ground emesis in the ER on the say of presentation.  CT with thickened small bowel loops along the region distal to the gastric surgical anastomosis c/w enteritis.  1/5: admitted 1/6: EGD: non-bleeding gastric ulcers, GJ anastomosis with ulceration and visible sutures   Patient with continued leaking of G-tube. Was evaluated and no interventions need at this time per surgery. May need upsizing. Will advance tube feeds tonight but only to 50 ml/hr in case leaking worsens.   Admission weight: 122 lbs Current weight: 124 lbs I/Os: +3.7L since admit  Medications: Ferrous sulfate , Linzess , Liquid MVI, Senokot,  Carafate   Labs reviewed: CBGs: 81-82   Diet Order:   Diet Order             Diet full liquid Fluid consistency: Thin  Diet effective now                   EDUCATION NEEDS:   Not appropriate for education at this time  Skin:  Skin Assessment: Reviewed RN Assessment  Last BM:  PTA  Height:   Ht Readings from Last 1 Encounters:  10/03/24 5' 7 (1.702 m)    Weight:   Wt Readings from Last 1 Encounters:  10/05/24 56.6 kg    BMI:  Body mass index is 19.54 kg/m.  Estimated Nutritional Needs:   Kcal:  1700-1900  Protein:  85-95g  Fluid:  1.9L/day   Morna Lee, MS, RD, LDN Inpatient Clinical Dietitian Contact via Secure chat

## 2024-10-05 NOTE — Progress Notes (Addendum)
 Alerted that g-tube is leaking some.  G-tube evaluate has been done which shows this to be in the correct location.  I have asked the RN to assure the phalange is taut against the skin.  Otherwise would continue to monitor.  Not sure etiology of leaking or what else to do for it currently.  If this persists, could ask IR to exchange, but don't see a need for that currently. D/w RN and primary service.   Contacted again after attempt at medication administration for leaking again.  I went to the bedside and found the phalange to not be taut and was allowing for the tube to slide around.  This can contribute to leakage.  I cinched the phalange down and flushed the tube with 30-40cc of water  and then some air.  There was no leakage with this.  It may leak again, but would like to see if this helps remedy this situation right now.  If it continues to leak despite this, then we may need IR to upsize/exchange the tube.  D/w RN and primary.  [name] 11:12 AM 10/05/2024

## 2024-10-05 NOTE — Progress Notes (Signed)
" °   10/05/24 1036  PT Visit Information  Last PT Received On 10/05/24  Reason Eval/Treat Not Completed  (pt reporting 7/10 pain and lethargic, difficulty keeping eyes open. Politely requesting PT attempt evaluation another time.)   PT will follow up as schedule allows.   "

## 2024-10-05 NOTE — Evaluation (Signed)
 Physical Therapy Evaluation Patient Details Name: [name] MRN: [id number] DOB: [birth year] Today's Date: 10/05/2024  History of Present Illness  Pt is a 49 y.o. female who presented with coffee-ground emesis, upper GI bleed. CT of the abdomen showed thickened small bowel loops along the region distal to the gastric surgical anastomosis consistent with enteritis. Pt with h/o remote complicated bariatric surgery resulting in recurrent UGI bleeding secondary to ulcers in the area of her GJ tube. chronic abdominal pain on opiates, chronic blood loss anemia requiring IV iron  infusions. She underwent EGD on 10/03/2024.   Clinical Impression  Pt is a 49 y.o. female with above HPI resulting in the deficits listed below (see PT Problem List). Pt reports use of RW and intermittent assist from son with tub transfers, curb step negotiation, some home management tasks. Pt expressed interest in electric scooter for community mobility- agreeable to trial of manual w/c for increased distances during PT sessions. Pt performed sit to stand transfers with supervision for safety and ambulated total of ~159ft with use of RW. Pt will benefit from skilled PT to maximize functional mobility to increase independence. Recommend home with intermittent assist and HHPT.         If plan is discharge home, recommend the following:     Can travel by private vehicle        Equipment Recommendations None recommended by PT (pt owns RW)  Recommendations for Other Services       Functional Status Assessment Patient has had a recent decline in their functional status and demonstrates the ability to make significant improvements in function in a reasonable and predictable amount of time.     Precautions / Restrictions Precautions Precautions: Fall Restrictions Weight Bearing Restrictions Per Provider Order: No      Mobility  Bed Mobility Overal bed mobility: Modified Independent             General  bed mobility comments: demonstrates good dynamic seated balance with donning/doffing shoes, socks, and pj pants while EOB.    Transfers Overall transfer level: Needs assistance Equipment used: Rolling walker (2 wheels) Transfers: Sit to/from Stand Sit to Stand: Supervision           General transfer comment: x3 during session from toilet seat and EOB x2.    Ambulation/Gait Ambulation/Gait assistance: Contact guard assist Gait Distance (Feet): 150 Feet Assistive device: Rolling walker (2 wheels) Gait Pattern/deviations: Step-through pattern, Decreased stride length (decreased foot clearance bilaterally) Gait velocity: decreased     General Gait Details: easily distracted in hallways, impulsive and intermittently abandoming RW around room and with LOB episode in standing when twisting around to bed to answer her phone requiring MIN A from PT to maintain balance. Seated res break required following toileting prior to ambulation in hallway. Reported eyes getting blurry with ambulation and states that happens before she has her falls. BP assessed once back in room 104/74 (83), 109bpm. Pt reports good BP for her.  Stairs            Wheelchair Mobility     Tilt Bed    Modified Rankin (Stroke Patients Only)       Balance Overall balance assessment: Mild deficits observed, not formally tested                                           Pertinent Vitals/Pain Pain Assessment Pain  Assessment: Faces Faces Pain Scale: Hurts even more Pain Location: B LEs Pain Descriptors / Indicators: Sore Pain Intervention(s): Limited activity within patient's tolerance, Monitored during session, Repositioned, Premedicated before session    Home Living Family/patient expects to be discharged to:: Private residence Living Arrangements: Children (son and 2 dogs) Available Help at Discharge: Family Type of Home: Apartment (1st floor) Home Access: Other (comment);Stairs  to enter (curb step)       Home Layout: One level Home Equipment: Agricultural Consultant (2 wheels);Rollator (4 wheels);Shower seat Additional Comments: 62 y.o. son works, pt reports she is alone when son is at work. neighbor helpful if need anything. 2 dogs. Pt reports she would like to get an art gallery manager for community activites. Was receiving HHPT/OT services prior to admission.    Prior Function Prior Level of Function : History of Falls (last six months);Needs assist       Physical Assist : Mobility (physical);ADLs (physical) Mobility (physical): Stairs;Transfers ADLs (physical): IADLs Mobility Comments: last fall Sunday due to dizziness crawled to get up to her bed. ADLs Comments: Son assists with tub transfers, curb step, bathes self and dresses. Orders groceries.     Extremity/Trunk Assessment   Upper Extremity Assessment Upper Extremity Assessment: Overall WFL for tasks assessed    Lower Extremity Assessment Lower Extremity Assessment: Generalized weakness    Cervical / Trunk Assessment Cervical / Trunk Assessment: Normal  Communication   Communication Communication: No apparent difficulties    Cognition Arousal: Alert Behavior During Therapy: WFL for tasks assessed/performed   PT - Cognitive impairments: No apparent impairments                       PT - Cognition Comments: pt noted to be impulsive during session, abandoning RW at times around room leading to x1 instanceof LOB requiring MIN A from PT to maintain upright balance in standing, lifting hands from RW when ambulating in hallway. Following commands: Intact       Cueing       General Comments      Exercises     Assessment/Plan    PT Assessment Patient needs continued PT services  PT Problem List Decreased strength;Decreased activity tolerance;Decreased balance;Decreased mobility;Decreased safety awareness;Pain       PT Treatment Interventions DME instruction;Gait training;Stair  training;Functional mobility training;Therapeutic activities;Therapeutic exercise;Balance training;Patient/family education;Wheelchair mobility training    PT Goals (Current goals can be found in the Care Plan section)  Acute Rehab PT Goals Patient Stated Goal: get stronger PT Goal Formulation: With patient Time For Goal Achievement: 10/19/24 Potential to Achieve Goals: Good Additional Goals Additional Goal #1: Pt will propel wheelchair 275ft independently    Frequency Min 3X/week     Co-evaluation               AM-PAC PT 6 Clicks Mobility  Outcome Measure Help needed turning from your back to your side while in a flat bed without using bedrails?: None Help needed moving from lying on your back to sitting on the side of a flat bed without using bedrails?: None Help needed moving to and from a bed to a chair (including a wheelchair)?: A Little Help needed standing up from a chair using your arms (e.g., wheelchair or bedside chair)?: A Little Help needed to walk in hospital room?: A Little Help needed climbing 3-5 steps with a railing? : A Lot 6 Click Score: 19    End of Session Equipment Utilized During Treatment: Gait belt Activity  Tolerance: Patient tolerated treatment well Patient left: in bed;with call bell/phone within reach;with bed alarm set Nurse Communication: Mobility status PT Visit Diagnosis: Unsteadiness on feet (R26.81);Muscle weakness (generalized) (M62.81);Other abnormalities of gait and mobility (R26.89)    Time: 8564-8474 PT Time Calculation (min) (ACUTE ONLY): 50 min   Charges:   PT Evaluation $PT Eval Low Complexity: 1 Low PT Treatments $Therapeutic Activity: 23-37 mins PT General Charges $$ ACUTE PT VISIT: 1 Visit         Tinnie BERRY PT, DPT  Acute Rehabilitation Services  Office [phone]  10/05/2024, 4:59 PM

## 2024-10-05 NOTE — TOC Progression Note (Signed)
 Transition of Care [facility]) - Progression Note    Patient Details  Name: [name] MRN: [id number] Date of Birth: 1976/06/25  Transition of Care [facility] CM/SW Contact  Chrisa Hassan, Nathanel, RN Phone Number: 10/05/2024, 3:21 PM  Clinical Narrative:  Spoke to patient about prior [facility] agency-[facility] provided HHPT/OT-will await PT recc.     Expected Discharge Plan: Home w Home Health Services Barriers to Discharge: Continued Medical Work up               Expected Discharge Plan and Services   Discharge Planning Services: CM Consult Post Acute Care Choice: Home Health Living arrangements for the past 2 months: Single Family Home                                       Social Drivers of Health (SDOH) Interventions SDOH Screenings   Food Insecurity: No Food Insecurity (10/03/2024)  Housing: Low Risk (10/03/2024)  Transportation Needs: No Transportation Needs (10/03/2024)  Utilities: Not At Risk (10/03/2024)  Depression (PHQ2-9): High Risk (08/25/2023)  Tobacco Use: Medium Risk (10/03/2024)    Readmission Risk Interventions    08/22/2024    3:28 PM 08/16/2024    1:26 PM 09/15/2023   11:43 AM  Readmission Risk Prevention Plan  Transportation Screening Complete Complete Complete  PCP or Specialist Appt within 3-5 Days Complete Complete Complete  HRI or Home Care Consult Complete Complete Complete  Social Work Consult for Recovery Care Planning/Counseling Complete Complete Complete  Palliative Care Screening Not Applicable Not Applicable Not Applicable  Medication Review Oceanographer) Complete Complete Complete

## 2024-10-05 NOTE — Progress Notes (Signed)
 [facility] Gastroenterology Progress Note  [name] 49 y.o. 04/28/1976   Subjective: PEG tube leaking. Feels knot on LUQ. Tolerating full liquid diet. Wants to eat eggs.  Objective: Vital signs: Vitals:   10/04/24 2003 10/05/24 0527  BP: (!) 102/55 101/62  Pulse: 82 88  Resp: 14 14  Temp: 98.7 F (37.1 C) 98.3 F (36.8 C)  SpO2: 100% 100%    Physical Exam: Gen: alert, no acute distress, thin HEENT: anicteric sclera CV: RRR Chest: CTA B Abd: diffuse tenderness with guarding, mild distention on left side, PEG tube noted, +BS Ext: no edema  Lab Results: Recent Labs    10/04/24 0531 10/05/24 0542  NA 140 140  K 4.1 4.3  CL 106 106  CO2 28 31  GLUCOSE 89 100*  BUN 7 8  CREATININE 0.52 0.65  CALCIUM  9.1 8.8*  MG 2.2 2.2  PHOS 2.7 3.9   Recent Labs    10/02/24 1830  AST 13*  ALT 7  ALKPHOS 77  BILITOT <0.2  PROT 6.1*  ALBUMIN  3.2*   Recent Labs    10/02/24 1632 10/02/24 1648 10/04/24 0531 10/05/24 0542  WBC 15.3*   < > 9.2 6.4  NEUTROABS 12.7*  --   --  3.1  HGB 10.6*   < > 7.6* 7.2*  HCT 34.6*   < > 25.2* 23.8*  MCV 93.8   < > 94.7 97.1  PLT 417*   < > 323 306   < > = values in this interval not displayed.      Assessment/Plan: Chronic anastomotic ulcer - would not recommend advancing to soft diet due to increased risk of bleeding of ulcer from eating food with consistency. Needs to obtain caloric needs mainly from PEG tube. Continue full liquid diet. Ok to change PPI to BID per G-tube if G-tube functioning normally, which is being tested today. Close f/u with [facility] surgery (reports her appt is now in late January). [facility] will sign off. Call if questions.   [name] Sol 10/05/2024, 9:26 AM  Questions please call [phone] ID: [name], female   DOB: October 04, 1975, 49 y.o.   MRN: [id number]

## 2024-10-05 NOTE — Progress Notes (Signed)
 " PROGRESS NOTE    [name]  FMW:[id number] DOB: 08-27-1976 DOA: 10/02/2024 PCP: Orion Kerns, MD   Brief Narrative:  [phone] with h/o remote complicated bariatric surgery resulting in recurrent UGI bleeding secondary to ulcers in the area of her GJ tube, chronic abdominal pain on opiates, chronic blood loss anemia requiring IV iron  infusions presented with coffee-ground emesis.  CT of the abdomen showed thickened small bowel loops along the region distal to the gastric surgical anastomosis consistent with enteritis.  She was started on IV PPI.  GI and general surgery were consulted.  She underwent EGD on 10/03/2024.  Assessment & Plan:   Upper GI bleeding Acute on chronic blood loss anemia Chronic marginal ulcer with perforation status post Arlyss omental repair on 08/21/2024 -Five weeks s/p surgical repair of perforated stomach ulcer. - Hemoglobin continues to trend downwards: 7.2 this morning.  No overt signs of bleeding since admission.  Monitor H&H.  Transfuse if hemoglobin is less than 7.  Continue IV PPI along with oral sucralfate . - Status post EGD on 10/03/2024: Showed acute  gastrojejunal anastomosis with ulceration and visible stuures; pouch-to-jejunum limb with ulceration and jejunojejunal anastomosis also with ulceration; 3 non-bleeding cratered gastric ulcers with pigmented material at anastomosis EGD reported chronic marginal ulcer that would need surgical evaluation for revision of anastomosis - General Surgery evaluation appreciated: Currently no need for any surgical intervention but if GI bleeding recurs, patient might need transfer to [facility]. - Diet advancement as per GI and general surgery. 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  Outpatient follow-up with pain management provided  Chronic constipation -Continue Linzess . Patient will need to bring her own Symproic  from home.  Add Senokot.  Orthostatic hypotension -continue midodrine   DVT prophylaxis: SCDs Code Status: Full Family Communication: None at bedside Disposition Plan: Status is: Inpatient Remains inpatient appropriate because: Of severity of illness    Consultants: GI/general surgery  Procedures: As above  Antimicrobials: None   Subjective: Patient seen and examined at bedside.  No fever, vomiting, worsening shortness of breath reported.  Concerned about leaking from her G-tube.  Continues to have intermittent abdominal pain.   Objective: Vitals:   10/04/24 1309 10/04/24 2003 10/05/24 0500 10/05/24 0527  BP: 100/71 (!) 102/55  101/62  Pulse: 83 82  88  Resp: 18 14  14   Temp: 98.5 F (36.9 C) 98.7 F (37.1 C)  98.3 F (36.8 C)  TempSrc: Oral Oral  Oral  SpO2: 100% 100%  100%  Weight:   56.6 kg   Height:        Intake/Output Summary (Last 24 hours) at 10/05/2024 0821 Last data filed at 10/05/2024 0600 Gross per 24 hour  Intake 964 ml  Output --  Net 964 ml   Filed Weights   10/03/24 1526 10/04/24 0500 10/05/24 0500  Weight: 55.7 kg 56.5 kg 56.6 kg    Examination:  General: On room air.  No distress ENT/neck: No thyromegaly.  JVD is not elevated  respiratory: Decreased breath sounds at bases bilaterally with some crackles; no wheezing  CVS: S1-S2 heard, rate controlled currently Abdominal: Soft, slightly tender, slightly distended; no organomegaly, bowel sounds are  heard.  Has a G-tube. Extremities: Trace lower extremity edema; no cyanosis  CNS: Awake and alert.  No focal neurologic deficit.  Moves extremities Lymph: No  obvious lymphadenopathy Skin: No obvious ecchymosis/lesions  psych: Affect is mostly flat.  Currently not agitated.   Musculoskeletal: No obvious joint swelling/deformity     Data Reviewed: I have personally reviewed following labs and imaging studies  CBC: Recent Labs  Lab 10/02/24 1632 10/02/24 1648 10/02/24 1845 10/03/24 1546 10/04/24 0531 10/05/24 0542  WBC 15.3*  --   --  9.0 9.2 6.4  NEUTROABS 12.7*  --   --   --   --  3.1  HGB 10.6* 12.2 10.5* 8.7* 7.6* 7.2*  HCT 34.6* 36.0 31.0* 27.6* 25.2* 23.8*  MCV 93.8  --   --  93.2 94.7 97.1  PLT 417*  --   --  396 323 306   Basic Metabolic Panel: Recent Labs  Lab 10/02/24 1830 10/02/24 1845 10/03/24 1546 10/04/24 0531 10/05/24 0542  NA 138 137 133* 140 140  K 3.7 3.7 4.1 4.1 4.3  CL 104 101 100 106 106  CO2 27  --  25 28 31   GLUCOSE 195* 179* 121* 89 100*  BUN 19 21* 9 7 8   CREATININE 0.47 0.50 0.52 0.52 0.65  CALCIUM  8.9  --  9.3 9.1 8.8*  MG  --   --   --  2.2 2.2  PHOS  --   --   --  2.7 3.9   GFR: Estimated Creatinine Clearance: 78.5 mL/min (by C-G formula based on SCr of 0.65 mg/dL). Liver Function Tests: Recent Labs  Lab 10/02/24 1830  AST 13*  ALT 7  ALKPHOS 77  BILITOT <0.2  PROT 6.1*  ALBUMIN  3.2*   No results for input(s): LIPASE, AMYLASE in the last 168 hours. No results for input(s): AMMONIA in the last 168 hours. Coagulation Profile: Recent Labs  Lab 10/02/24 1632  INR 1.0   Cardiac Enzymes: No results for input(s): CKTOTAL, CKMB, CKMBINDEX, TROPONINI in the last 168 hours. BNP (last 3 results) No results for input(s): PROBNP in the last 8760 hours. HbA1C: No results for input(s): HGBA1C in the last 72 hours. CBG: Recent Labs  Lab 10/04/24 1656 10/04/24 2001 10/04/24 2357 10/05/24 0500 10/05/24 0745  GLUCAP 84 94 102* 86 82   Lipid Profile: No results for input(s): CHOL, HDL, LDLCALC, TRIG, CHOLHDL, LDLDIRECT in the last 72 hours. Thyroid   Function Tests: No results for input(s): TSH, T4TOTAL, FREET4, T3FREE, THYROIDAB in the last 72 hours. Anemia Panel: No results for input(s): VITAMINB12, FOLATE, FERRITIN, TIBC, IRON , RETICCTPCT in the last 72 hours. Sepsis Labs: Recent Labs  Lab 10/02/24 1648 10/02/24 1842  LATICACIDVEN 1.4 1.0    No results found for this or any previous visit (from the past 240 hours).       Radiology Studies: No results found.       Scheduled Meds:  amitriptyline   50 mg Oral QHS   atorvastatin   40 mg Oral Daily   buPROPion   450 mg Oral Daily   busPIRone   15 mg Oral TID   DULoxetine   60 mg Oral BID   feeding supplement (OSMOLITE 1.5 CAL)  1,000 mL Per Tube Q24H   ferrous sulfate   325 mg Oral Q breakfast   gabapentin   900 mg Oral TID   HYDROmorphone   4 mg Oral Q6H   linaclotide   290 mcg Oral QAC breakfast   mesalamine   2.4 g Oral BID  midodrine   10 mg Oral TID WC   misoprostol   200 mcg Oral Daily   multivitamin  15 mL Per Tube Daily   Naldemedine Tosylate   0.2 mg Oral QAC breakfast   oxybutynin   10 mg Oral QHS   pantoprazole  (PROTONIX ) IV  40 mg Intravenous Q12H   Ensure Max Protein  11 oz Oral BID   sodium chloride  flush  3 mL Intravenous Q12H   sucralfate   1 g Oral TID WC & HS   Continuous Infusions:          Sophie Mao, MD [facility] 10/05/2024, 8:21 AM   "

## 2024-10-05 NOTE — Telephone Encounter (Signed)
 Patient referred to infusion pharmacy team for ambulatory infusion of IV iron .  Insurance - [employer]  Site of care - Site of care: [facility] Dx code - D62 IV Iron  Therapy - Feraheme  510 mg IV x 1. Venofer  200 mg IV x 1 given inpatient  Infusion appointments - Scheduling team will schedule patient as soon as possible.   Yadhira Mckneely D. Kaytie Ratcliffe, PharmD

## 2024-10-05 NOTE — Telephone Encounter (Signed)
 Called pt's cousin and got pt scheduled with Dr. Vear 11/02/24 @ 9:30am.

## 2024-10-06 ENCOUNTER — Inpatient Hospital Stay (HOSPITAL_COMMUNITY)

## 2024-10-06 DIAGNOSIS — K922 Gastrointestinal hemorrhage, unspecified: Secondary | ICD-10-CM | POA: Diagnosis not present

## 2024-10-06 LAB — BASIC METABOLIC PANEL WITH GFR
Anion gap: 8 (ref 5–15)
BUN: 8 mg/dL (ref 6–20)
CO2: 26 mmol/L (ref 22–32)
Calcium: 8.7 mg/dL — ABNORMAL LOW (ref 8.9–10.3)
Chloride: 104 mmol/L (ref 98–111)
Creatinine, Ser: 0.64 mg/dL (ref 0.44–1.00)
GFR, Estimated: >60 mL/min
Glucose, Bld: 140 mg/dL — ABNORMAL HIGH (ref 70–99)
Potassium: 4.3 mmol/L (ref 3.5–5.1)
Sodium: 139 mmol/L (ref 135–145)

## 2024-10-06 LAB — CBC WITH DIFFERENTIAL/PLATELET
Abs Immature Granulocytes: 0.01 K/uL (ref 0.00–0.07)
Basophils Absolute: 0 K/uL (ref 0.0–0.1)
Basophils Relative: 0 %
Eosinophils Absolute: 0.2 K/uL (ref 0.0–0.5)
Eosinophils Relative: 3 %
HCT: 24.3 % — ABNORMAL LOW (ref 36.0–46.0)
Hemoglobin: 7.4 g/dL — ABNORMAL LOW (ref 12.0–15.0)
Immature Granulocytes: 0 %
Lymphocytes Relative: 30 %
Lymphs Abs: 2.1 K/uL (ref 0.7–4.0)
MCH: 28.8 pg (ref 26.0–34.0)
MCHC: 30.5 g/dL (ref 30.0–36.0)
MCV: 94.6 fL (ref 80.0–100.0)
Monocytes Absolute: 0.5 K/uL (ref 0.1–1.0)
Monocytes Relative: 7 %
Neutro Abs: 4.2 K/uL (ref 1.7–7.7)
Neutrophils Relative %: 60 %
Platelets: 333 K/uL (ref 150–400)
RBC: 2.57 MIL/uL — ABNORMAL LOW (ref 3.87–5.11)
RDW: 16.8 % — ABNORMAL HIGH (ref 11.5–15.5)
WBC: 7.1 K/uL (ref 4.0–10.5)
nRBC: 0 % (ref 0.0–0.2)

## 2024-10-06 LAB — GLUCOSE, CAPILLARY
Glucose-Capillary: 103 mg/dL — ABNORMAL HIGH (ref 70–99)
Glucose-Capillary: 90 mg/dL (ref 70–99)
Glucose-Capillary: 91 mg/dL (ref 70–99)
Glucose-Capillary: 91 mg/dL (ref 70–99)
Glucose-Capillary: 93 mg/dL (ref 70–99)
Glucose-Capillary: 98 mg/dL (ref 70–99)

## 2024-10-06 LAB — MAGNESIUM: Magnesium: 2.3 mg/dL (ref 1.7–2.4)

## 2024-10-06 LAB — PHOSPHORUS: Phosphorus: 4.2 mg/dL (ref 2.5–4.6)

## 2024-10-06 MED ORDER — OSMOLITE 1.5 CAL PO LIQD
1000.0000 mL | ORAL | Status: DC
  Administered 2024-10-06 – 2024-10-14 (×10): 1000 mL
  Filled 2024-10-06 (×10): qty 1000

## 2024-10-06 MED ORDER — ADULT MULTIVITAMIN LIQUID CH
15.0000 mL | Freq: Every day | ORAL | Status: DC
  Administered 2024-10-07 – 2024-10-15 (×9): 15 mL via ORAL
  Filled 2024-10-06 (×9): qty 15

## 2024-10-06 MED ORDER — SORBITOL 70 % SOLN: 30.0000 mL | Freq: Every day | Status: DC | PRN

## 2024-10-06 MED ORDER — LACTULOSE 10 GM/15ML PO SOLN
20.0000 g | Freq: Two times a day (BID) | ORAL | Status: DC
  Administered 2024-10-07: 20 g via ORAL
  Filled 2024-10-06 (×2): qty 30

## 2024-10-06 MED ORDER — IOHEXOL 300 MG/ML  SOLN
50.0000 mL | Freq: Once | INTRAMUSCULAR | Status: AC | PRN
  Administered 2024-10-06: 50 mL

## 2024-10-06 MED ORDER — NYSTATIN 100000 UNIT/ML MT SUSP
5.0000 mL | Freq: Four times a day (QID) | OROMUCOSAL | Status: DC
  Administered 2024-10-06 – 2024-10-15 (×31): 500000 [IU] via ORAL
  Filled 2024-10-06 (×36): qty 5

## 2024-10-06 MED ORDER — LACTULOSE 10 GM/15ML PO SOLN
20.0000 g | Freq: Two times a day (BID) | ORAL | Status: DC
  Administered 2024-10-06: 20 g
  Filled 2024-10-06: qty 30

## 2024-10-06 NOTE — Progress Notes (Signed)
 Nutrition Follow-up  DOCUMENTATION CODES:   Severe malnutrition in context of chronic illness  INTERVENTION:   -Patient requesting reducing tube feeds to 45 ml/hr  -Run Osmolite 1.5 @ 45 ml/hr x 14 hours (1800-0800) via G-tube -Provides 945 kcals, 39g protein and 480 ml H2O  After discharge patient can resume home regimen of: -Nutren 2.0 @ 52 ml/hr x 14 hours via G-tube -Provides 1500 kcals, 63g protein and 519 ml H2O -Continue daily MVI via tube -Free water : 100 ml every 4 hours  -Ensure MAX Protein po BID as tolerated, each supplement provides 150 kcal and 30 grams of protein   NUTRITION DIAGNOSIS:   Severe Malnutrition related to chronic illness, altered GI function as evidenced by severe fat depletion, severe muscle depletion.  Ongoing.  GOAL:   Patient will meet greater than or equal to 90% of their needs  Not meeting.  MONITOR:   PO intake, Supplement acceptance, TF tolerance  ASSESSMENT:   48yo with h/o remote complicated bariatric surgery resulting in recurrent UGI bleeding secondary to ulcers in the area of her GJ tube who presented on 1/5 with hematemesis.  She has chronic abdominal pain for which she takes opiates and chronic blood loss anemia for which she receives IV iron  infusions.  She had 8 episodes of coffee ground emesis in the ER on the say of presentation.  CT with thickened small bowel loops along the region distal to the gastric surgical anastomosis c/w enteritis.  1/5: admitted 1/6: EGD: non-bleeding gastric ulcers, GJ anastomosis with ulceration and visible sutures   Patient in room, G-tube came out while pt was in the bathroom. Reports the tube was still leaking last night while tube feeds ran at 50 ml/hr. Requesting reducing rate to 45 ml/hr tonight after tube is replaced. Noted IR has been consulted for tube replacement.  Will place order for when tube is ready to use.  Pt is sipping on liquids, drinking some Ensure Max.   Admission weight:  122 lbs Current weight: 108 lbs I/Os: +4.9L since admit  Medications: Ferrous sulfate , Linzess , Liquid MVI, Senokot, Carafate , Zofran   Labs reviewed: CBGs: 90-103  Diet Order:   Diet Order             Diet full liquid Fluid consistency: Thin  Diet effective now                   EDUCATION NEEDS:   Not appropriate for education at this time  Skin:  Skin Assessment: Reviewed RN Assessment  Last BM:  1/1  Height:   Ht Readings from Last 1 Encounters:  10/03/24 5' 7 (1.702 m)    Weight:   Wt Readings from Last 1 Encounters:  10/06/24 49.3 kg    BMI:  Body mass index is 17.01 kg/m.  Estimated Nutritional Needs:   Kcal:  1700-1900  Protein:  85-95g  Fluid:  1.9L/day  Morna Lee, MS, RD, LDN Inpatient Clinical Dietitian Contact via Secure chat

## 2024-10-06 NOTE — TOC Progression Note (Signed)
 Transition of Care [facility]) - Progression Note    Patient Details  Name: [name] MRN: [id number] Date of Birth: December 09, 1975  [facility] CM/SW Contact  Jaylise Peek, Nathanel, RN Phone Number: 10/06/2024, 12:06 PM  Clinical Narrative:  Already active w/[facility] HHPT/OT rep Cindie aware.     Expected Discharge Plan: Home w Home Health Services Barriers to Discharge: Continued Medical Work up               Expected Discharge Plan and Services   Discharge Planning Services: CM Consult Post Acute Care Choice: Home Health Living arrangements for the past 2 months: Single Family Home                           HH Arranged: PT, OT HH Agency: [facility] Date [facility] Agency Contacted: 10/06/24 Time HH Agency Contacted: 1206 Representative spoke with at [facility] Agency: cindie   Social Drivers of Health (SDOH) Interventions SDOH Screenings   Food Insecurity: No Food Insecurity (10/03/2024)  Housing: Low Risk (10/03/2024)  Transportation Needs: No Transportation Needs (10/03/2024)  Utilities: Not At Risk (10/03/2024)  Depression (PHQ2-9): High Risk (08/25/2023)  Tobacco Use: Medium Risk (10/03/2024)    Readmission Risk Interventions    08/22/2024    3:28 PM 08/16/2024    1:26 PM 09/15/2023   11:43 AM  Readmission Risk Prevention Plan  Transportation Screening Complete Complete Complete  PCP or Specialist Appt within 3-5 Days Complete Complete Complete  HRI or Home Care Consult Complete Complete Complete  Social Work Consult for Recovery Care Planning/Counseling Complete Complete Complete  Palliative Care Screening Not Applicable Not Applicable Not Applicable  Medication Review Oceanographer) Complete Complete Complete

## 2024-10-06 NOTE — Progress Notes (Signed)
 " PROGRESS NOTE    [name]  FMW:[id number] DOB: 12-14-1975 DOA: 10/02/2024 PCP: Orion Kerns, MD   Brief Narrative:  [phone] with h/o remote complicated bariatric surgery resulting in recurrent UGI bleeding secondary to ulcers in the area of her GJ tube, chronic abdominal pain on opiates, chronic blood loss anemia requiring IV iron  infusions presented with coffee-ground emesis.  CT of the abdomen showed thickened small bowel loops along the region distal to the gastric surgical anastomosis consistent with enteritis.  She was started on IV PPI.  GI and general surgery were consulted.  She underwent EGD on 10/03/2024.  Assessment & Plan:   Upper GI bleeding Acute on chronic blood loss anemia Chronic marginal ulcer with perforation status post Arlyss omental repair on 08/21/2024 -Five weeks s/p surgical repair of perforated stomach ulcer. - Hemoglobin 7.4 this morning.  No overt signs of bleeding since admission.  Monitor H&H.  Transfuse if hemoglobin is less than 7.  Continue IV PPI along with oral sucralfate . - Status post EGD on 10/03/2024: Showed acute  gastrojejunal anastomosis with ulceration and visible stuures; pouch-to-jejunum limb with ulceration and jejunojejunal anastomosis also with ulceration; 3 non-bleeding cratered gastric ulcers with pigmented material at anastomosis EGD reported chronic marginal ulcer that would need surgical evaluation for revision of anastomosis - General Surgery following: Currently no need for any surgical intervention but if GI bleeding recurs, patient might need transfer to [facility]. - Diet advancement as per GI and general surgery. - Continue misoprostol  and mesalamine   Moderate protein calorie malnutrition - Continue tube feeding as per dietitian recommendations - Patient having issues with leaking G-tube.  General surgery following regarding the same.  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  Outpatient follow-up with pain management provided  Chronic constipation -Continue Linzess . Patient has not been able to bring her own Symproic  from home.  Continue Senokot.  Add lactulose  via tube  Orthostatic hypotension -continue midodrine   DVT prophylaxis: SCDs Code Status: Full Family Communication: None at bedside Disposition Plan: Status is: Inpatient Remains inpatient appropriate because: Of severity of illness    Consultants: GI/general surgery  Procedures: As above  Antimicrobials: None   Subjective: Patient seen and examined at bedside.  Does not feel well; concerned about leaking from tube.  Has not had a bowel movement for more than a week.  No fever or shortness of breath reported  objective: Vitals:   10/05/24 1344 10/05/24 1952 10/06/24 0500 10/06/24 0512  BP: 103/68 (!) 101/58  114/65  Pulse: 95 96  (!) 106  Resp:  14  14  Temp: 99 F (37.2 C) 99.2 F (37.3 C)  99.3 F (37.4 C)  TempSrc: Oral Oral  Oral  SpO2: 100% 99%  100%  Weight:   49.3 kg   Height:        Intake/Output Summary (Last 24 hours) at 10/06/2024 0952 Last data filed at 10/06/2024 0600 Gross per 24 hour  Intake 1196 ml  Output --  Net 1196 ml   Filed Weights   10/04/24 0500 10/05/24 0500 10/06/24 0500  Weight: 56.5 kg 56.6 kg 49.3 kg    Examination:  General: No acute distress.  Remains on room air. ENT/neck: No palpable neck masses or JVD elevation noted  respiratory: Bilateral decreased breath sounds  at bases with scattered crackles CVS: Mostly rate controlled; S1 and S2 are heard  abdominal: Soft, remains slightly tender and distended; no organomegaly, bowel sounds are heard normally.  G-tube with dressing present. Extremities: No clubbing;  mild lower extremity edema present  CNS: Alert and oriented.  No focal neurologic deficit.  Able to move extremities Lymph: No obvious palpable lymphadenopathy Skin: No obvious petechiae/rashes psych: Not agitated currently with mostly flat affect Musculoskeletal: No obvious joint tenderness/erythema     Data Reviewed: I have personally reviewed following labs and imaging studies  CBC: Recent Labs  Lab 10/02/24 1632 10/02/24 1648 10/02/24 1845 10/03/24 1546 10/04/24 0531 10/05/24 0542 10/06/24 0547  WBC 15.3*  --   --  9.0 9.2 6.4 7.1  NEUTROABS 12.7*  --   --   --   --  3.1 4.2  HGB 10.6*   < > 10.5* 8.7* 7.6* 7.2* 7.4*  HCT 34.6*   < > 31.0* 27.6* 25.2* 23.8* 24.3*  MCV 93.8  --   --  93.2 94.7 97.1 94.6  PLT 417*  --   --  396 323 306 333   < > = values in this interval not displayed.   Basic Metabolic Panel: Recent Labs  Lab 10/02/24 1830 10/02/24 1845 10/03/24 1546 10/04/24 0531 10/05/24 0542 10/06/24 0547  NA 138 137 133* 140 140 139  K 3.7 3.7 4.1 4.1 4.3 4.3  CL 104 101 100 106 106 104  CO2 27  --  25 28 31 26   GLUCOSE 195* 179* 121* 89 100* 140*  BUN 19 21* 9 7 8 8   CREATININE 0.47 0.50 0.52 0.52 0.65 0.64  CALCIUM  8.9  --  9.3 9.1 8.8* 8.7*  MG  --   --   --  2.2 2.2 2.3  PHOS  --   --   --  2.7 3.9 4.2   GFR: Estimated Creatinine Clearance: 68.4 mL/min (by C-G formula based on SCr of 0.64 mg/dL). Liver Function Tests: Recent Labs  Lab 10/02/24 1830  AST 13*  ALT 7  ALKPHOS 77  BILITOT <0.2  PROT 6.1*  ALBUMIN  3.2*   No results for input(s): LIPASE, AMYLASE in the last 168 hours. No results for input(s): AMMONIA in the last 168 hours. Coagulation Profile: Recent Labs  Lab 10/02/24 1632  INR 1.0   Cardiac Enzymes: No results for input(s): CKTOTAL, CKMB, CKMBINDEX, TROPONINI in the last 168 hours. BNP (last 3 results) No results for input(s): PROBNP in the last 8760 hours. HbA1C: No results for input(s): HGBA1C in  the last 72 hours. CBG: Recent Labs  Lab 10/05/24 1718 10/05/24 1952 10/06/24 0010 10/06/24 0417 10/06/24 0741  GLUCAP 81 85 93 91 90   Lipid Profile: No results for input(s): CHOL, HDL, LDLCALC, TRIG, CHOLHDL, LDLDIRECT in the last 72 hours. Thyroid  Function Tests: No results for input(s): TSH, T4TOTAL, FREET4, T3FREE, THYROIDAB in the last 72 hours. Anemia Panel: No results for input(s): VITAMINB12, FOLATE, FERRITIN, TIBC, IRON , RETICCTPCT in the last 72 hours. Sepsis Labs: Recent Labs  Lab 10/02/24 1648 10/02/24 1842  LATICACIDVEN 1.4 1.0    No results found for this or any previous visit (from the past 240 hours).       Radiology Studies: DG ABDOMEN PEG TUBE LOCATION Result Date: 10/05/2024 CLINICAL DATA:  Peg tube dysfunction. EXAM: ABDOMEN - 1 VIEW COMPARISON:  Abdomen and pelvis CT dated 10/02/2024 FINDINGS: The PEG tube tip and balloon are obscured by injected contrast in the distal  stomach and duodenal bulb. No extravasated contrast is seen. Normal bowel-gas pattern. Left upper quadrant surgical clips and bilateral lower quadrant. Left sacral neural stimulator. Unremarkable bones. IMPRESSION: PEG tube tip and balloon are obscured by injected contrast in the distal stomach and duodenal bulb. No extravasated contrast is seen. Electronically Signed   By: Elspeth Bathe M.D.   On: 10/05/2024 13:55         Scheduled Meds:  amitriptyline   50 mg Oral QHS   atorvastatin   40 mg Oral Daily   baclofen   10 mg Oral TID   buPROPion   450 mg Oral Daily   busPIRone   15 mg Oral TID   DULoxetine   60 mg Oral BID   feeding supplement (OSMOLITE 1.5 CAL)  1,000 mL Per Tube Q24H   ferrous sulfate   325 mg Oral Q breakfast   gabapentin   900 mg Oral TID   HYDROmorphone   4 mg Oral Q6H   linaclotide   290 mcg Oral QAC breakfast   mesalamine   2.4 g Oral BID   midodrine   10 mg Oral TID WC   misoprostol   200 mcg Oral Daily   multivitamin  15 mL Per Tube  Daily   oxybutynin   10 mg Oral QHS   pantoprazole  (PROTONIX ) IV  40 mg Intravenous Q12H   Ensure Max Protein  11 oz Oral BID   senna-docusate  1 tablet Oral BID   sodium chloride  flush  3 mL Intravenous Q12H   sucralfate   1 g Oral TID WC & HS   Continuous Infusions:          Sophie Mao, MD [facility] 10/06/2024, 9:52 AM   "

## 2024-10-06 NOTE — Procedures (Signed)
 Pt's existing foley cath in G tube tract exchanged out for new 20 fr balloon retention G tube without immediate complications. Balloon filled with 9 cc saline, tube flushed without difficulty and secured to skin site. Follow up abd film pending. EBL none.

## 2024-10-06 NOTE — Progress Notes (Signed)
 CN to pt room to assess once pt advised G tube fell out. Pt stated tube was caught on toilet and pulled out once pt stood up. This RN paged and spoke to surgery PA, Burnard. Per verbal order, 16 F foley placed in G tube tract and taped to pt abdomen. Pt had no complaints during placement.

## 2024-10-07 DIAGNOSIS — K922 Gastrointestinal hemorrhage, unspecified: Secondary | ICD-10-CM | POA: Diagnosis not present

## 2024-10-07 LAB — BASIC METABOLIC PANEL WITH GFR
Anion gap: 4 — ABNORMAL LOW (ref 5–15)
BUN: 9 mg/dL (ref 6–20)
CO2: 30 mmol/L (ref 22–32)
Calcium: 8.5 mg/dL — ABNORMAL LOW (ref 8.9–10.3)
Chloride: 104 mmol/L (ref 98–111)
Creatinine, Ser: 0.63 mg/dL (ref 0.44–1.00)
GFR, Estimated: >60 mL/min
Glucose, Bld: 105 mg/dL — ABNORMAL HIGH (ref 70–99)
Potassium: 4.2 mmol/L (ref 3.5–5.1)
Sodium: 138 mmol/L (ref 135–145)

## 2024-10-07 LAB — CBC WITH DIFFERENTIAL/PLATELET
Abs Immature Granulocytes: 0.01 K/uL (ref 0.00–0.07)
Basophils Absolute: 0 K/uL (ref 0.0–0.1)
Basophils Relative: 0 %
Eosinophils Absolute: 0.2 K/uL (ref 0.0–0.5)
Eosinophils Relative: 4 %
HCT: 23.2 % — ABNORMAL LOW (ref 36.0–46.0)
Hemoglobin: 7 g/dL — ABNORMAL LOW (ref 12.0–15.0)
Immature Granulocytes: 0 %
Lymphocytes Relative: 32 %
Lymphs Abs: 1.7 K/uL (ref 0.7–4.0)
MCH: 29.2 pg (ref 26.0–34.0)
MCHC: 30.2 g/dL (ref 30.0–36.0)
MCV: 96.7 fL (ref 80.0–100.0)
Monocytes Absolute: 0.4 K/uL (ref 0.1–1.0)
Monocytes Relative: 8 %
Neutro Abs: 2.9 K/uL (ref 1.7–7.7)
Neutrophils Relative %: 56 %
Platelets: 295 K/uL (ref 150–400)
RBC: 2.4 MIL/uL — ABNORMAL LOW (ref 3.87–5.11)
RDW: 16.7 % — ABNORMAL HIGH (ref 11.5–15.5)
WBC: 5.3 K/uL (ref 4.0–10.5)
nRBC: 0 % (ref 0.0–0.2)

## 2024-10-07 LAB — PHOSPHORUS: Phosphorus: 4.2 mg/dL (ref 2.5–4.6)

## 2024-10-07 LAB — GLUCOSE, CAPILLARY
Glucose-Capillary: 112 mg/dL — ABNORMAL HIGH (ref 70–99)
Glucose-Capillary: 114 mg/dL — ABNORMAL HIGH (ref 70–99)
Glucose-Capillary: 79 mg/dL (ref 70–99)
Glucose-Capillary: 82 mg/dL (ref 70–99)
Glucose-Capillary: 91 mg/dL (ref 70–99)
Glucose-Capillary: 94 mg/dL (ref 70–99)
Glucose-Capillary: 97 mg/dL (ref 70–99)

## 2024-10-07 LAB — HEMOGLOBIN AND HEMATOCRIT, BLOOD
HCT: 25.2 % — ABNORMAL LOW (ref 36.0–46.0)
Hemoglobin: 8.1 g/dL — ABNORMAL LOW (ref 12.0–15.0)

## 2024-10-07 LAB — MAGNESIUM: Magnesium: 2.4 mg/dL (ref 1.7–2.4)

## 2024-10-07 LAB — PREPARE RBC (CROSSMATCH)

## 2024-10-07 MED ORDER — SODIUM CHLORIDE 0.9% IV SOLUTION: Freq: Once | INTRAVENOUS | Status: AC

## 2024-10-07 NOTE — Progress Notes (Signed)
 " PROGRESS NOTE    [name]  FMW:[id number] DOB: Apr 30, 1976 DOA: 10/02/2024 PCP: Orion Kerns, MD   Brief Narrative:  [phone] with h/o remote complicated bariatric surgery resulting in recurrent UGI bleeding secondary to ulcers in the area of her GJ tube, chronic abdominal pain on opiates, chronic blood loss anemia requiring IV iron  infusions presented with coffee-ground emesis.  CT of the abdomen showed thickened small bowel loops along the region distal to the gastric surgical anastomosis consistent with enteritis.  She was started on IV PPI.  GI and general surgery were consulted.  She underwent EGD on 10/03/2024.  Assessment & Plan:   Upper GI bleeding Acute on chronic blood loss anemia Chronic marginal ulcer with perforation status post Arlyss omental repair on 08/21/2024 -Five weeks s/p surgical repair of perforated stomach ulcer. - Hemoglobin 7.0 this morning.  Transfuse 1 unit packed red cells.  No overt signs of bleeding since admission.  Monitor H&H. Continue IV PPI along with oral sucralfate . - Status post EGD on 10/03/2024: Showed acute  gastrojejunal anastomosis with ulceration and visible stuures; pouch-to-jejunum limb with ulceration and jejunojejunal anastomosis also with ulceration; 3 non-bleeding cratered gastric ulcers with pigmented material at anastomosis EGD reported chronic marginal ulcer that would need surgical evaluation for revision of anastomosis.  GI has signed off and recommended only liquid diet. - General Surgery following: Currently no need for any surgical intervention but if GI bleeding recurs, patient might need transfer to [facility]. - Continue misoprostol  and mesalamine   Severe protein calorie malnutrition - Continue tube feeding as per dietitian recommendations - Patient having issues with leaking G-tube.  General surgery following regarding the same.  IR exchanged the Foley catheter in G-tube on 10/06/2024.  Folate deficiency anemia due to  malabsorption Iron  deficiency anemia - Anemia plan as above  Hyperlipidemia -continue statin  Depression with anxiety - Continue amitriptyline , bupropion , buspirone , duloxetine  and as needed Klonopin   Chronic pain with opiate dependence -Continue home current pain management regimen.  Outpatient follow-up with pain management provided  Chronic constipation -Continue Linzess . Patient has not been able to bring her own Symproic  from home.  Continue Senokot.  Continue lactulose  via tube  Orthostatic hypotension -continue midodrine   DVT prophylaxis: SCDs Code Status: Full Family Communication: None at bedside Disposition Plan: Status is: Inpatient Remains inpatient appropriate because: Of severity of illness    Consultants: GI/general surgery  Procedures: As above  Antimicrobials: None   Subjective: Patient seen and examined at bedside.  Has not had a bowel movement yet.  No fever, chest pain reported  Objective: Vitals:   10/06/24 0512 10/06/24 1353 10/06/24 2005 10/07/24 0417  BP: 114/65 98/72 107/68 (!) 94/58  Pulse: (!) 106 98 77 94  Resp: 14  16 16   Temp: 99.3 F (37.4 C) 99.6 F (37.6 C) 98 F (36.7 C) 98.7 F (37.1 C)  TempSrc: Oral Oral Oral Oral  SpO2: 100% 100% 100% 100%  Weight:      Height:        Intake/Output Summary (Last 24 hours) at 10/07/2024 0811 Last data filed at 10/07/2024 0600 Gross per 24 hour  Intake 1069.17 ml  Output --  Net 1069.17 ml   Filed Weights   10/04/24 0500 10/05/24 0500 10/06/24 0500  Weight: 56.5 kg 56.6 kg 49.3 kg    Examination:  General: On room air currently and in no distress ENT/neck: No obvious thyromegaly or JVD elevation noted respiratory: Decreased breath sounds at bases bilaterally with some crackles CVS: Rate  currently controlled; S1 and S2 are heard abdominal: Soft, remains distended and tender; no organomegaly, normal bowel sounds are heard.  G-tube with dressing present. Extremities: Trace lower  extremity edema present; no cyanosis  CNS: Awake and alert.  No obvious focal neurologic deficit.   Lymph: No obvious lymphadenopathy palpable  skin: No obvious ecchymosis/lesions psych: Affect is mostly flat with no signs of agitation.   Musculoskeletal: No obvious joint deformity/swelling    Data Reviewed: I have personally reviewed following labs and imaging studies  CBC: Recent Labs  Lab 10/02/24 1632 10/02/24 1648 10/03/24 1546 10/04/24 0531 10/05/24 0542 10/06/24 0547 10/07/24 0534  WBC 15.3*  --  9.0 9.2 6.4 7.1 5.3  NEUTROABS 12.7*  --   --   --  3.1 4.2 2.9  HGB 10.6*   < > 8.7* 7.6* 7.2* 7.4* 7.0*  HCT 34.6*   < > 27.6* 25.2* 23.8* 24.3* 23.2*  MCV 93.8  --  93.2 94.7 97.1 94.6 96.7  PLT 417*  --  396 323 306 333 295   < > = values in this interval not displayed.   Basic Metabolic Panel: Recent Labs  Lab 10/03/24 1546 10/04/24 0531 10/05/24 0542 10/06/24 0547 10/07/24 0534  NA 133* 140 140 139 138  K 4.1 4.1 4.3 4.3 4.2  CL 100 106 106 104 104  CO2 25 28 31 26 30   GLUCOSE 121* 89 100* 140* 105*  BUN 9 7 8 8 9   CREATININE 0.52 0.52 0.65 0.64 0.63  CALCIUM  9.3 9.1 8.8* 8.7* 8.5*  MG  --  2.2 2.2 2.3 2.4  PHOS  --  2.7 3.9 4.2 4.2   GFR: Estimated Creatinine Clearance: 68.4 mL/min (by C-G formula based on SCr of 0.63 mg/dL). Liver Function Tests: Recent Labs  Lab 10/02/24 1830  AST 13*  ALT 7  ALKPHOS 77  BILITOT <0.2  PROT 6.1*  ALBUMIN  3.2*   No results for input(s): LIPASE, AMYLASE in the last 168 hours. No results for input(s): AMMONIA in the last 168 hours. Coagulation Profile: Recent Labs  Lab 10/02/24 1632  INR 1.0   Cardiac Enzymes: No results for input(s): CKTOTAL, CKMB, CKMBINDEX, TROPONINI in the last 168 hours. BNP (last 3 results) No results for input(s): PROBNP in the last 8760 hours. HbA1C: No results for input(s): HGBA1C in the last 72 hours. CBG: Recent Labs  Lab 10/06/24 1651 10/06/24 2006  10/07/24 0005 10/07/24 0412 10/07/24 0801  GLUCAP 98 91 114* 97 79   Lipid Profile: No results for input(s): CHOL, HDL, LDLCALC, TRIG, CHOLHDL, LDLDIRECT in the last 72 hours. Thyroid  Function Tests: No results for input(s): TSH, T4TOTAL, FREET4, T3FREE, THYROIDAB in the last 72 hours. Anemia Panel: No results for input(s): VITAMINB12, FOLATE, FERRITIN, TIBC, IRON , RETICCTPCT in the last 72 hours. Sepsis Labs: Recent Labs  Lab 10/02/24 1648 10/02/24 1842  LATICACIDVEN 1.4 1.0    No results found for this or any previous visit (from the past 240 hours).       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  CT AP, 10/02/2024 and 09/23/2024. FINDINGS: Support lines; gastrostomy tube, well-positioned with contrast opacification of the remnant stomach antrum and proximal small bowel. Sacral nerve stimulator. Postsurgical changes with staple line and surgical clips at the epigastrium. Mild air distention of colon at the LEFT hemiabdomen. No radio-opaque calculi or other significant radiographic abnormality are  seen. IMPRESSION: Well-positioned gastrostomy tube, with contrast opacification of the remnant stomach antrum and proximal small bowel. Electronically Signed   By: Thom Hall M.D.   On: 10/06/2024 16:04   IR REPLACE G-TUBE COMPLEX WO FLUORO (TRACT REV) Result Date: 10/06/2024 INDICATION: Catheter malfunctioning, dislodged. Request received for gastrostomy tube exchange Briefly, 49 year old female with complicated bariatric history, s/p sleeve gastrectomy and laparoscopic placement of gastrostomy tube into remnant stomach (09/14/2023). EXAM: GASTROSTOMY TUBE EXCHANGE MEDICATIONS: None ANESTHESIA/SEDATION: None CONTRAST:  None FLUOROSCOPY: None COMPLICATIONS: None immediate. PROCEDURE: Informed written consent was obtained  from the patient after a thorough discussion of the procedural risks, benefits and alternatives. All questions were addressed. A timeout was performed prior to the initiation of the procedure. The existing Foley catheter was removed from the gastrostomy tube insertion site tract and a new 20 Fr balloon retention gastrostomy tube was inserted, balloon filled with 9 mL saline and secured to skin site. Tube was flushed without difficulty. Follow up abdominal film pending. IMPRESSION: Successful bedside exchange for a new 20 Fr balloon-retention gastrostomy tube. Performed by: Franky Rakers, PA-C under supervision of Thom Hall, MD Electronically Signed   By: Thom Hall M.D.   On: 10/06/2024 14:30   DG ABDOMEN PEG TUBE LOCATION Result Date: 10/05/2024 CLINICAL DATA:  Peg tube dysfunction. EXAM: ABDOMEN - 1 VIEW COMPARISON:  Abdomen and pelvis CT dated 10/02/2024 FINDINGS: The PEG tube tip and balloon are obscured by injected contrast in the distal stomach and duodenal bulb. No extravasated contrast is seen. Normal bowel-gas pattern. Left upper quadrant surgical clips and bilateral lower quadrant. Left sacral neural stimulator. Unremarkable bones. IMPRESSION: PEG tube tip and balloon are obscured by injected contrast in the distal stomach and duodenal bulb. No extravasated contrast is seen. Electronically Signed   By: Elspeth Bathe M.D.   On: 10/05/2024 13:55         Scheduled Meds:  amitriptyline   50 mg Oral QHS   atorvastatin   40 mg Oral Daily   baclofen   10 mg Oral TID   buPROPion   450 mg Oral Daily   busPIRone   15 mg Oral TID   DULoxetine   60 mg Oral BID   feeding supplement (OSMOLITE 1.5 CAL)  1,000 mL Per Tube Q24H   ferrous sulfate   325 mg Oral Q breakfast   gabapentin   900 mg Oral TID   HYDROmorphone   4 mg Oral Q6H   lactulose   20 g Oral BID   linaclotide   290 mcg Oral QAC breakfast   mesalamine   2.4 g Oral BID   midodrine   10 mg Oral TID WC   misoprostol   200 mcg Oral Daily    multivitamin  15 mL Oral Daily   nystatin   5 mL Oral QID   oxybutynin   10 mg Oral QHS   pantoprazole  (PROTONIX ) IV  40 mg Intravenous Q12H   Ensure Max Protein  11 oz Oral BID   senna-docusate  1 tablet Oral BID   sodium chloride  flush  3 mL Intravenous Q12H   sucralfate   1 g Oral TID WC & HS   Continuous Infusions:          Sophie Mao, MD [facility] 10/07/2024, 8:11 AM   "

## 2024-10-08 DIAGNOSIS — K922 Gastrointestinal hemorrhage, unspecified: Secondary | ICD-10-CM | POA: Diagnosis not present

## 2024-10-08 LAB — GLUCOSE, CAPILLARY
Glucose-Capillary: 99 mg/dL (ref 70–99)
Glucose-Capillary: 112 mg/dL — ABNORMAL HIGH (ref 70–99)
Glucose-Capillary: 113 mg/dL — ABNORMAL HIGH (ref 70–99)
Glucose-Capillary: 88 mg/dL (ref 70–99)
Glucose-Capillary: 109 mg/dL — ABNORMAL HIGH (ref 70–99)

## 2024-10-08 LAB — BASIC METABOLIC PANEL WITH GFR
Anion gap: 7 (ref 5–15)
BUN: 7 mg/dL (ref 6–20)
CO2: 29 mmol/L (ref 22–32)
Calcium: 8.5 mg/dL — ABNORMAL LOW (ref 8.9–10.3)
Chloride: 103 mmol/L (ref 98–111)
Creatinine, Ser: 0.63 mg/dL (ref 0.44–1.00)
GFR, Estimated: >60 mL/min
Glucose, Bld: 92 mg/dL (ref 70–99)
Potassium: 4.6 mmol/L (ref 3.5–5.1)
Sodium: 139 mmol/L (ref 135–145)

## 2024-10-08 LAB — CBC WITH DIFFERENTIAL/PLATELET
Abs Immature Granulocytes: 0.02 K/uL (ref 0.00–0.07)
Basophils Absolute: 0 K/uL (ref 0.0–0.1)
Basophils Relative: 0 %
Eosinophils Absolute: 0.3 K/uL (ref 0.0–0.5)
Eosinophils Relative: 3 %
HCT: 27.8 % — ABNORMAL LOW (ref 36.0–46.0)
Hemoglobin: 8.7 g/dL — ABNORMAL LOW (ref 12.0–15.0)
Immature Granulocytes: 0 %
Lymphocytes Relative: 19 %
Lymphs Abs: 1.8 K/uL (ref 0.7–4.0)
MCH: 29.6 pg (ref 26.0–34.0)
MCHC: 31.3 g/dL (ref 30.0–36.0)
MCV: 94.6 fL (ref 80.0–100.0)
Monocytes Absolute: 0.6 K/uL (ref 0.1–1.0)
Monocytes Relative: 6 %
Neutro Abs: 6.8 K/uL (ref 1.7–7.7)
Neutrophils Relative %: 72 %
Platelets: 339 K/uL (ref 150–400)
RBC: 2.94 MIL/uL — ABNORMAL LOW (ref 3.87–5.11)
RDW: 17.3 % — ABNORMAL HIGH (ref 11.5–15.5)
WBC: 9.4 K/uL (ref 4.0–10.5)
nRBC: 0 % (ref 0.0–0.2)

## 2024-10-08 LAB — MAGNESIUM: Magnesium: 2.5 mg/dL — ABNORMAL HIGH (ref 1.7–2.4)

## 2024-10-08 MED ORDER — LACTULOSE 10 GM/15ML PO SOLN
30.0000 g | Freq: Three times a day (TID) | ORAL | Status: DC
  Administered 2024-10-08 – 2024-10-12 (×10): 30 g
  Filled 2024-10-08 (×13): qty 45

## 2024-10-08 NOTE — Progress Notes (Signed)
 " PROGRESS NOTE    [name]  FMW:[id number] DOB: [birth year] DOA: 10/02/2024 PCP: Orion Kerns, MD   Brief Narrative:  [phone] with h/o remote complicated bariatric surgery resulting in recurrent UGI bleeding secondary to ulcers in the area of her GJ tube, chronic abdominal pain on opiates, chronic blood loss anemia requiring IV iron  infusions presented with coffee-ground emesis.  CT of the abdomen showed thickened small bowel loops along the region distal to the gastric surgical anastomosis consistent with enteritis.  She was started on IV PPI.  GI and general surgery were consulted.  She underwent EGD on 10/03/2024.  Assessment & Plan:   Upper GI bleeding Acute on chronic blood loss anemia Chronic marginal ulcer with perforation status post Arlyss omental repair on 08/21/2024 -Five weeks s/p surgical repair of perforated stomach ulcer. - Hemoglobin 7.0 on 10/07/2024: Status post 1 unit packed red cell transfusion.  Hemoglobin 8.7 this morning.  No overt signs of bleeding since admission.  Monitor H&H. Continue IV PPI along with oral sucralfate . - Status post EGD on 10/03/2024: Showed acute  gastrojejunal anastomosis with ulceration and visible stuures; pouch-to-jejunum limb with ulceration and jejunojejunal anastomosis also with ulceration; 3 non-bleeding cratered gastric ulcers with pigmented material at anastomosis EGD reported chronic marginal ulcer that would need surgical evaluation for revision of anastomosis.  GI has signed off and recommended only liquid diet. - General Surgery following: Currently no need for any surgical intervention but if GI bleeding recurs, patient might need transfer to [facility]. - Continue misoprostol  and mesalamine   Severe protein calorie malnutrition - Continue tube feeding as per dietitian recommendations - Patient having issues with leaking G-tube.  General surgery following regarding the same.  IR exchanged the Foley catheter in G-tube on  10/06/2024.  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  Outpatient follow-up with pain management provided  Chronic constipation -Continue Linzess . Patient has not been able to bring her own Symproic  from home.  Continue Senokot.  Continue lactulose  via tube  Orthostatic hypotension -continue midodrine   DVT prophylaxis: SCDs Code Status: Full Family Communication: None at bedside Disposition Plan: Status is: Inpatient Remains inpatient appropriate because: Of severity of illness    Consultants: GI/general surgery  Procedures: As above  Antimicrobials: None   Subjective: Patient seen and examined at bedside.  Has not had a bowel movement yet.  Does not feel ready to go home today.  No chest pain, shortness of breath reported. Objective: Vitals:   10/07/24 1958 10/08/24 0432 10/08/24 0826 10/08/24 0826  BP: 98/64 99/66  106/74  Pulse: 78 98  90  Resp: 14 16    Temp: 98.6 F (37 C) 98.8 F (37.1 C)    TempSrc: Oral Oral    SpO2: 99% 100%    Weight:   56 kg   Height:        Intake/Output Summary (Last 24 hours) at 10/08/2024 1003 Last data filed at 10/08/2024 0700 Gross per 24 hour  Intake 2140.33 ml  Output --  Net 2140.33 ml   Filed Weights   10/05/24 0500 10/06/24 0500 10/08/24 0826  Weight: 56.6 kg 49.3 kg 56 kg    Examination:  General: Remains on room air and in no acute distress currently  ENT/neck: No neck masses or palpable thyromegaly noted  respiratory: Bilateral decreased breath sounds at bases with some crackles scattered  CVS: S1-S2 heard; rate currently controlled mostly  abdominal: Soft, slightly distended and mildly tender; no organomegaly, bowel sounds are normally  heard.  Has a G-tube with dressing  extremities: No clubbing; mild lower extremity edema present  CNS: Alert and oriented.  No focal deficits noted. Lymph: No palpable lymphadenopathy noted skin: No obvious petechiae/rashes psych: Flat affect with no agitation currently  musculoskeletal: No obvious joint tenderness/erythema    Data Reviewed: I have personally reviewed following labs and imaging studies  CBC: Recent Labs  Lab 10/02/24 1632 10/02/24 1648 10/04/24 0531 10/05/24 0542 10/06/24 0547 10/07/24 0534 10/07/24 1916 10/08/24 0521  WBC 15.3*   < > 9.2 6.4 7.1 5.3  --  9.4  NEUTROABS 12.7*  --   --  3.1 4.2 2.9  --  6.8  HGB 10.6*   < > 7.6* 7.2* 7.4* 7.0* 8.1* 8.7*  HCT 34.6*   < > 25.2* 23.8* 24.3* 23.2* 25.2* 27.8*  MCV 93.8   < > 94.7 97.1 94.6 96.7  --  94.6  PLT 417*   < > 323 306 333 295  --  339   < > = values in this interval not displayed.   Basic Metabolic Panel: Recent Labs  Lab 10/04/24 0531 10/05/24 0542 10/06/24 0547 10/07/24 0534 10/08/24 0521  NA 140 140 139 138 139  K 4.1 4.3 4.3 4.2 4.6  CL 106 106 104 104 103  CO2 28 31 26 30 29   GLUCOSE 89 100* 140* 105* 92  BUN 7 8 8 9 7   CREATININE 0.52 0.65 0.64 0.63 0.63  CALCIUM  9.1 8.8* 8.7* 8.5* 8.5*  MG 2.2 2.2 2.3 2.4 2.5*  PHOS 2.7 3.9 4.2 4.2  --    GFR: Estimated Creatinine Clearance: 77.7 mL/min (by C-G formula based on SCr of 0.63 mg/dL). Liver Function Tests: Recent Labs  Lab 10/02/24 1830  AST 13*  ALT 7  ALKPHOS 77  BILITOT <0.2  PROT 6.1*  ALBUMIN  3.2*   No results for input(s): LIPASE, AMYLASE in the last 168 hours. No results for input(s): AMMONIA in the last 168 hours. Coagulation Profile: Recent Labs  Lab 10/02/24 1632  INR 1.0   Cardiac Enzymes: No results for input(s): CKTOTAL, CKMB, CKMBINDEX, TROPONINI in the last 168 hours. BNP (last 3 results) No results for input(s): PROBNP in the last 8760 hours. HbA1C: No results for input(s): HGBA1C in  the last 72 hours. CBG: Recent Labs  Lab 10/07/24 1554 10/07/24 1955 10/07/24 2334 10/08/24 0428 10/08/24 0730  GLUCAP 82 91 112* 99 112*   Lipid Profile: No results for input(s): CHOL, HDL, LDLCALC, TRIG, CHOLHDL, LDLDIRECT in the last 72 hours. Thyroid  Function Tests: No results for input(s): TSH, T4TOTAL, FREET4, T3FREE, THYROIDAB in the last 72 hours. Anemia Panel: No results for input(s): VITAMINB12, FOLATE, FERRITIN, TIBC, IRON , RETICCTPCT in the last 72 hours. Sepsis Labs: Recent Labs  Lab 10/02/24 1648 10/02/24 1842  LATICACIDVEN 1.4 1.0    No results found for this or any previous visit (from the past 240 hours).       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  CT AP, 10/02/2024 and 09/23/2024. FINDINGS: Support lines; gastrostomy tube, well-positioned with contrast opacification of the remnant stomach antrum and proximal small bowel. Sacral nerve stimulator. Postsurgical changes with staple line and  surgical clips at the epigastrium. Mild air distention of colon at the LEFT hemiabdomen. No radio-opaque calculi or other significant radiographic abnormality are seen. IMPRESSION: Well-positioned gastrostomy tube, with contrast opacification of the remnant stomach antrum and proximal small bowel. Electronically Signed   By: Thom Hall M.D.   On: 10/06/2024 16:04   IR REPLACE G-TUBE COMPLEX WO FLUORO (TRACT REV) Result Date: 10/06/2024 INDICATION: Catheter malfunctioning, dislodged. Request received for gastrostomy tube exchange Briefly, 49 year old female with complicated bariatric history, s/p sleeve gastrectomy and laparoscopic placement of gastrostomy tube into remnant stomach (09/14/2023). EXAM: GASTROSTOMY TUBE EXCHANGE MEDICATIONS: None ANESTHESIA/SEDATION: None CONTRAST:  None FLUOROSCOPY: None  COMPLICATIONS: None immediate. PROCEDURE: Informed written consent was obtained from the patient after a thorough discussion of the procedural risks, benefits and alternatives. All questions were addressed. A timeout was performed prior to the initiation of the procedure. The existing Foley catheter was removed from the gastrostomy tube insertion site tract and a new 20 Fr balloon retention gastrostomy tube was inserted, balloon filled with 9 mL saline and secured to skin site. Tube was flushed without difficulty. Follow up abdominal film pending. IMPRESSION: Successful bedside exchange for a new 20 Fr balloon-retention gastrostomy tube. Performed by: Franky Rakers, PA-C under supervision of Thom Hall, MD Electronically Signed   By: Thom Hall M.D.   On: 10/06/2024 14:30         Scheduled Meds:  amitriptyline   50 mg Oral QHS   atorvastatin   40 mg Oral Daily   baclofen   10 mg Oral TID   buPROPion   450 mg Oral Daily   busPIRone   15 mg Oral TID   DULoxetine   60 mg Oral BID   feeding supplement (OSMOLITE 1.5 CAL)  1,000 mL Per Tube Q24H   ferrous sulfate   325 mg Oral Q breakfast   gabapentin   900 mg Oral TID   HYDROmorphone   4 mg Oral Q6H   lactulose   20 g Oral BID   linaclotide   290 mcg Oral QAC breakfast   mesalamine   2.4 g Oral BID   midodrine   10 mg Oral TID WC   misoprostol   200 mcg Oral Daily   multivitamin  15 mL Oral Daily   nystatin   5 mL Oral QID   oxybutynin   10 mg Oral QHS   pantoprazole  (PROTONIX ) IV  40 mg Intravenous Q12H   Ensure Max Protein  11 oz Oral BID   senna-docusate  1 tablet Oral BID   sodium chloride  flush  3 mL Intravenous Q12H   sucralfate   1 g Oral TID WC & HS   Continuous Infusions:          Sophie Mao, MD [facility] 10/08/2024, 10:03 AM   "

## 2024-10-09 DIAGNOSIS — K922 Gastrointestinal hemorrhage, unspecified: Secondary | ICD-10-CM | POA: Diagnosis not present

## 2024-10-09 LAB — CBC WITH DIFFERENTIAL/PLATELET
Abs Immature Granulocytes: 0.04 K/uL (ref 0.00–0.07)
Basophils Absolute: 0 K/uL (ref 0.0–0.1)
Basophils Relative: 0 %
Eosinophils Absolute: 0.2 K/uL (ref 0.0–0.5)
Eosinophils Relative: 2 %
HCT: 27.3 % — ABNORMAL LOW (ref 36.0–46.0)
Hemoglobin: 8.6 g/dL — ABNORMAL LOW (ref 12.0–15.0)
Immature Granulocytes: 0 %
Lymphocytes Relative: 14 %
Lymphs Abs: 1.8 K/uL (ref 0.7–4.0)
MCH: 29.7 pg (ref 26.0–34.0)
MCHC: 31.5 g/dL (ref 30.0–36.0)
MCV: 94.1 fL (ref 80.0–100.0)
Monocytes Absolute: 0.9 K/uL (ref 0.1–1.0)
Monocytes Relative: 7 %
Neutro Abs: 9.6 K/uL — ABNORMAL HIGH (ref 1.7–7.7)
Neutrophils Relative %: 77 %
Platelets: 342 K/uL (ref 150–400)
RBC: 2.9 MIL/uL — ABNORMAL LOW (ref 3.87–5.11)
RDW: 17.2 % — ABNORMAL HIGH (ref 11.5–15.5)
WBC: 12.5 K/uL — ABNORMAL HIGH (ref 4.0–10.5)
nRBC: 0 % (ref 0.0–0.2)

## 2024-10-09 LAB — BPAM RBC
Blood Product Expiration Date: 202602102359
ISSUE DATE / TIME: 202601101401
Unit Type and Rh: 5100

## 2024-10-09 LAB — BASIC METABOLIC PANEL WITH GFR
Anion gap: 7 (ref 5–15)
BUN: 7 mg/dL (ref 6–20)
CO2: 27 mmol/L (ref 22–32)
Calcium: 8.7 mg/dL — ABNORMAL LOW (ref 8.9–10.3)
Chloride: 103 mmol/L (ref 98–111)
Creatinine, Ser: 0.58 mg/dL (ref 0.44–1.00)
GFR, Estimated: >60 mL/min
Glucose, Bld: 115 mg/dL — ABNORMAL HIGH (ref 70–99)
Potassium: 4.4 mmol/L (ref 3.5–5.1)
Sodium: 137 mmol/L (ref 135–145)

## 2024-10-09 LAB — GLUCOSE, CAPILLARY
Glucose-Capillary: 100 mg/dL — ABNORMAL HIGH (ref 70–99)
Glucose-Capillary: 101 mg/dL — ABNORMAL HIGH (ref 70–99)
Glucose-Capillary: 106 mg/dL — ABNORMAL HIGH (ref 70–99)
Glucose-Capillary: 109 mg/dL — ABNORMAL HIGH (ref 70–99)
Glucose-Capillary: 211 mg/dL — ABNORMAL HIGH (ref 70–99)
Glucose-Capillary: 83 mg/dL (ref 70–99)

## 2024-10-09 LAB — TYPE AND SCREEN
ABO/RH(D): O POS
Antibody Screen: NEGATIVE
Unit division: 0

## 2024-10-09 LAB — MAGNESIUM: Magnesium: 2.3 mg/dL (ref 1.7–2.4)

## 2024-10-09 MED ORDER — OSMOLITE 1.5 CAL PO LIQD: 1000.0000 mL | ORAL | 30 refills | Status: DC

## 2024-10-09 MED ORDER — FERROUS SULFATE 325 (65 FE) MG PO TABS: 325.0000 mg | ORAL_TABLET | Freq: Every day | ORAL | 0 refills | Status: AC

## 2024-10-09 MED ORDER — NUTREN 2.0 PO LIQD: ORAL | 30 refills | Status: AC

## 2024-10-09 MED ORDER — SENNOSIDES-DOCUSATE SODIUM 8.6-50 MG PO TABS: 1.0000 | ORAL_TABLET | Freq: Two times a day (BID) | ORAL | 0 refills | Status: AC

## 2024-10-09 MED ORDER — MECLIZINE HCL 25 MG PO TABS
25.0000 mg | ORAL_TABLET | Freq: Three times a day (TID) | ORAL | Status: DC | PRN
  Administered 2024-10-09 – 2024-10-10 (×3): 25 mg via ORAL
  Filled 2024-10-09 (×3): qty 1

## 2024-10-09 MED ORDER — PANTOPRAZOLE SODIUM 40 MG PO TBEC: 40.0000 mg | DELAYED_RELEASE_TABLET | Freq: Two times a day (BID) | ORAL | 0 refills | Status: AC

## 2024-10-09 NOTE — TOC Transition Note (Signed)
 Transition of Care [facility]) - Discharge Note   Patient Details  Name: [name] MRN: [id number] Date of Birth: 1976-03-21  [facility] CM/SW Contact:  Bascom Service, RN Phone Number: 10/09/2024, 11:18 AM   Clinical Narrative: spoke to Makai(Son) about d/c plans return home w/already active [facility] HHRN/PT/OT rep Cindie aware;[employer] rep Pam-Tube feeds. Has own transport home.      Final next level of care: Home w Home Health Services Barriers to Discharge: No Barriers Identified   Patient Goals and CMS Choice Patient states their goals for this hospitalization and ongoing recovery are:: Home CMS Medicare.gov Compare Post Acute Care list provided to:: Patient Represenative (must comment) Choice offered to / list presented to : Patient [place] ownership interest in [facility].provided to:: Adult Children    Discharge Placement                       Discharge Plan and Services Additional resources added to the After Visit Summary for     Discharge Planning Services: CM Consult Post Acute Care Choice: Home Health          DME Arranged: Tube feeding DME Agency: Other - Comment ([employer]-already active w/TF)       HH Arranged: RN, PT, OT HH Agency: [facility], [facility] Date [facility] Agency Contacted: 10/09/24 Time HH Agency Contacted: 1206 Representative spoke with at [facility] Agency: Cindie  Social Drivers of Health (SDOH) Interventions SDOH Screenings   Food Insecurity: No Food Insecurity (10/03/2024)  Housing: Low Risk (10/03/2024)  Transportation Needs: No Transportation Needs (10/03/2024)  Utilities: Not At Risk (10/03/2024)  Depression (PHQ2-9): High Risk (08/25/2023)  Tobacco Use: Medium Risk (10/03/2024)     Readmission Risk Interventions    08/22/2024    3:28 PM 08/16/2024    1:26 PM 09/15/2023   11:43 AM  Readmission Risk Prevention Plan  Transportation Screening Complete Complete Complete  PCP or  Specialist Appt within 3-5 Days Complete Complete Complete  HRI or Home Care Consult Complete Complete Complete  Social Work Consult for Recovery Care Planning/Counseling Complete Complete Complete  Palliative Care Screening Not Applicable Not Applicable Not Applicable  Medication Review Oceanographer) Complete Complete Complete

## 2024-10-09 NOTE — Progress Notes (Signed)
 Mobility Specialist - Progress Note   10/09/24 0936  Mobility  Activity Ambulated with assistance  Level of Assistance Contact guard assist, steadying assist  Assistive Device Front wheel walker  Distance Ambulated (ft) 110 ft  Range of Motion/Exercises Active  Activity Response Tolerated fair  Mobility visit 1 Mobility  Mobility Specialist Start Time (ACUTE ONLY) 0920  Mobility Specialist Stop Time (ACUTE ONLY) 0936  Mobility Specialist Time Calculation (min) (ACUTE ONLY) 16 min   Pt was found in bathroom and agreeable to mobilize afterwards. Required multiple cues for safety and was a little unsteady. At EOS returned to bed with all needs met. Call bell in reach.   Erminio Leos,  Mobility Specialist Can be reached via Secure Chat

## 2024-10-09 NOTE — Discharge Summary (Addendum)
 Physician Discharge Summary  [name] FMW:[id number] DOB: 21-Dec-1975 DOA: 10/02/2024  PCP: Orion Kerns, MD  Admit date: 10/02/2024 Discharge date: 10/09/2024  Admitted From: Home Disposition: Home  Recommendations for Outpatient Follow-up:  Follow up with PCP in 1 week with repeat CBC/BMP Outpatient follow-up with GI Follow up in ED if symptoms worsen or new appear   Home Health: No Equipment/Devices: None  Discharge Condition: Stable CODE STATUS: Full Diet recommendation: Full liquid diet/tube feeding diet as per dietary recommendations  Brief/Interim Summary: 49yo with h/o remote complicated bariatric surgery resulting in recurrent UGI bleeding secondary to ulcers in the area of her GJ tube, chronic abdominal pain on opiates, chronic blood loss anemia requiring IV iron  infusions presented with coffee-ground emesis. CT of the abdomen showed thickened small bowel loops along the region distal to the gastric surgical anastomosis consistent with enteritis. She was started on IV PPI. GI and general surgery were consulted. She underwent EGD on 10/03/2024.  She had issues with leaking from G-tube which has been fixed.  She is currently hemodynamically stable and feels ready to go home today.  She will be discharged home today with outpatient follow-up with PCP and GI.  Discharge Diagnoses:   Upper GI bleeding Acute on chronic blood loss anemia Chronic marginal ulcer with perforation status post Arlyss omental repair on 08/21/2024 -Five weeks s/p surgical repair of perforated stomach ulcer. - Hemoglobin 7.0 on 10/07/2024: Status post 1 unit packed red cell transfusion.  Hemoglobin 8.6 this morning.  No overt signs of bleeding since admission.  Outpatient follow-up of H&H.  Currently on IV PPI.  Continue oral PPI and sucralfate  on discharge. - Status post EGD on 10/03/2024: Showed acute  gastrojejunal anastomosis with ulceration and visible stuures; pouch-to-jejunum limb with  ulceration and jejunojejunal anastomosis also with ulceration; 3 non-bleeding cratered gastric ulcers with pigmented material at anastomosis EGD reported chronic marginal ulcer that would need surgical evaluation for revision of anastomosis.  GI has signed off and recommended only full liquid diet. - General Surgery has signed off: Currently no need for any surgical intervention but if GI bleeding recurs, patient might need transfer to [facility].  Outpatient follow-up with GI at [facility]. - Continue misoprostol  and mesalamine  -Patient is currently medically and hemodynamically stable will discharge patient home today.   Severe protein calorie malnutrition - Continue tube feeding as per dietitian recommendations - Patient had issues with leaking G-tube.  General surgery following regarding the same.  IR exchanged the Foley catheter in G-tube on 10/06/2024.  Currently tolerating tube feeding.   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  Outpatient follow-up with pain management   Chronic constipation -Continue Linzess  and Symproic .  Continue Senokot.     Orthostatic hypotension -continue midodrine    Discharge Instructions  Discharge Instructions     Amb Referral to Intravenous Iron  Therapy   Complete by: As directed    You have been referred to [facility] Infusion team for IV Iron  Infusions. The infusion pharmacy team will reach out to you with appointment information.    Primary Diagnosis Code for IV Iron : D50.0 - Iron  deficiency Anemia secondary to blood loss (Chronic)   Secondary diagnosis code for IV iron : Other   Comment: Acute on chronic blood loss anemia  Increase activity slowly   Complete by: As directed        Allergies as of 10/09/2024       Reactions   Ciprofloxacin  Itching, Dermatitis, Rash   Coconut (cocos Nucifera) Anaphylaxis, Itching   Coconut Oil Anaphylaxis, Itching   Doxycycline Nausea And Vomiting   Oxycodone Anaphylaxis   Trazodone  And Nefazodone Rash   Hydroxyzine  Hives, Rash   Nefazodone Rash   Penicillins Hives, Rash, Other (See Comments)   Has patient had a PCN reaction causing immediate rash, facial/tongue/throat swelling, SOB or lightheadedness with hypotension: Yes   Silicone Rash   Tape Rash, Other (See Comments)   And, paper tape causes a rash if worn for a prolong period of time   Trazodone  Rash   Wound Dressing Adhesive Rash        Medication List     STOP taking these medications    Gerhardt's butt cream Crea   omeprazole  40 MG capsule Commonly known as: PRILOSEC    torsemide  5 MG tablet Commonly known as: DEMADEX    valACYclovir  1000 MG tablet Commonly known as: VALTREX        TAKE these medications    ACCRUFeR  30 MG Caps Generic drug: Ferric Maltol  Take 30 mg by mouth 2 (two) times daily.   albuterol  108 (90 Base) MCG/ACT inhaler Commonly known as: VENTOLIN  HFA Inhale 2 puffs into the lungs every 6 (six) hours as needed for wheezing or shortness of breath.   amitriptyline  25 MG tablet Commonly known as: ELAVIL  Take 50 mg by mouth at bedtime.   atorvastatin  40 MG tablet Commonly known as: LIPITOR Take 40 mg by mouth daily.   baclofen  10 MG tablet Commonly known as: LIORESAL  Take 10 mg by mouth 3 (three) times daily.   Botox  200 units injection Generic drug: botulinum toxin Type A  INJECT 155 UNITS INTRAMUSCULARLY INTO HEAD AND NECK MUSCLES EVERY 3 MONTHS   Buprenorphine  HCl 600 MCG Film Place 600 mcg under the tongue in the morning and at bedtime.   buPROPion  150 MG 24 hr tablet Commonly known as: WELLBUTRIN  XL Take 150 mg by mouth in the morning.   buPROPion  300 MG 24 hr tablet Commonly known as: WELLBUTRIN  XL Take  300 mg by mouth in the morning.   busPIRone  15 MG tablet Commonly known as: BUSPAR  Take 15 mg by mouth 3 (three) times daily.   clonazePAM  1 MG tablet Commonly known as: KLONOPIN  Take 1 mg by mouth 2 (two) times daily as needed for anxiety.   cyanocobalamin  1000 MCG/ML injection Commonly known as: VITAMIN B12 Inject 1,000 mcg into the muscle 2 (two) times a week.   diclofenac  Sodium 1 % Gel Commonly known as: VOLTAREN  Apply 2 g topically 4 (four) times daily as needed (Pain).   dicyclomine  10 MG capsule Commonly known as: BENTYL  Take 10 mg by mouth 4 (four) times daily -  before meals and at bedtime.   DULoxetine  60 MG capsule Commonly known as: CYMBALTA  Take 1 capsule (60 mg total) by mouth 2 (two) times daily.   EPINEPHrine  0.3 mg/0.3 mL Soaj injection Commonly known as: EPI-PEN Inject 0.3 mg into the muscle as needed for anaphylaxis.   famotidine  20 MG tablet Commonly known as: PEPCID  Take 20 mg by mouth 2 (two) times daily.   ferrous sulfate  325 (65 FE) MG tablet Take 1 tablet (325 mg total) by mouth daily with breakfast. Start taking on: October 10, 2024   fluticasone  50 MCG/ACT nasal spray Commonly known as: FLONASE  Place  1-2 sprays into both nostrils daily as needed for allergies or rhinitis.   gabapentin  600 MG tablet Commonly known as: NEURONTIN  Take 900 mg by mouth 3 (three) times daily.   HYDROmorphone  4 MG tablet Commonly known as: DILAUDID  Take 4 mg by mouth every 4 (four) hours.   linaclotide  290 MCG Caps capsule Commonly known as: LINZESS  Take 290 mcg by mouth daily before breakfast.   mesalamine  1.2 g EC tablet Commonly known as: LIALDA  Take 2.4 g by mouth 2 (two) times daily.   methocarbamol  500 MG tablet Commonly known as: ROBAXIN  Take 2 tablets (1,000 mg total) by mouth every 6 (six) hours as needed (use for muscle cramps/pain). What changed: reasons to take this   midodrine  10 MG tablet Commonly known as: PROAMATINE  Take 1 tablet  (10 mg total) by mouth 3 (three) times daily with meals.   misoprostol  200 MCG tablet Commonly known as: CYTOTEC  Take 200 mcg by mouth daily.   multivitamin with minerals Tabs tablet Take 1 tablet by mouth daily with breakfast.   Narcan  4 MG/0.1ML Liqd nasal spray kit Generic drug: naloxone  Place 1 spray into the nose daily as needed (overdose).   Nutren 2.0 Liqd -Nutren 2.0 @ 52 ml/hr x 14 hours via G-tube -Provides 1500 kcals, 63g protein and 519 ml H2O -Continue daily MVI via tube -Free water : 100 ml every 4 hours What changed:  how to take this when to take this additional instructions Another medication with the same name was removed. Continue taking this medication, and follow the directions you see here.   nystatin  100000 UNIT/ML suspension Commonly known as: MYCOSTATIN  Take 5 mLs by mouth See admin instructions. 5 ml's by mouth three times a day AS DIRECTED   ondansetron  8 MG disintegrating tablet Commonly known as: ZOFRAN -ODT Take 8 mg by mouth See admin instructions. Dissolve 8 mg in the mouth every eight hours   oxybutynin  5 MG 24 hr tablet Commonly known as: DITROPAN -XL Take 10 mg by mouth at bedtime.   pantoprazole  40 MG tablet Commonly known as: PROTONIX  Take 1 tablet (40 mg total) by mouth 2 (two) times daily before a meal. Crush and take twice daily   PRESCRIPTION MEDICATION Take 400 mg by mouth See admin instructions. Magnesium  ES 400 mg SGC - Crush/dissolve 400 mg and take by mouth once a day   Qulipta  30 MG Tabs Generic drug: Atogepant  TAKE 1 TABLET BY MOUTH EVERY DAY   senna-docusate 8.6-50 MG tablet Commonly known as: Senokot-S Take 1 tablet by mouth 2 (two) times daily.   sucralfate  1 GM/10ML suspension Commonly known as: Carafate  Take 10 mLs (1 g total) by mouth 4 (four) times daily.   SUMAtriptan  50 MG tablet Commonly known as: IMITREX  Take 50 mg by mouth daily as needed for migraine or headache (and may repeat once in 2 hours if headache  persists or recurs).   Symproic  0.2 MG Tabs Generic drug: Naldemedine Tosylate  Take 0.2 mg by mouth daily before breakfast.   thiamine  100 MG tablet Commonly known as: Vitamin B-1 Take 100 mg by mouth daily at 12 noon.   Vitamin D  (Ergocalciferol ) 1.25 MG (50000 UNIT) Caps capsule Commonly known as: DRISDOL  Take 50,000 Units by mouth every Monday.   VITAMIN D3 PO Take 1 capsule by mouth daily.          Follow-up Information     Rogatnick, Lewis, MD. Schedule an appointment as soon as possible for a visit in 1 week(s).   Specialty: General  Practice Contact information: [address] [place] [address] [phone]         Dianna Specking, MD. Schedule an appointment as soon as possible for a visit in 1 week(s).   Specialty: Gastroenterology Contact information: [address] [place] [address] [phone]                Allergies[1]  Consultations: GI/general surgery   Procedures/Studies: DG ABDOMEN PEG TUBE LOCATION Result Date: 10/06/2024 CLINICAL DATA:  14941 Gastrostomy status (HCC) 14941 EXAM: ABDOMEN - 1 VIEW COMPARISON:  KUB, 10/05/2024.  CT AP, 10/02/2024 and 09/23/2024. FINDINGS: Support lines; gastrostomy tube, well-positioned with contrast opacification of the remnant stomach antrum and proximal small bowel. Sacral nerve stimulator. Postsurgical changes with staple line and surgical clips at the epigastrium. Mild air distention of colon at the LEFT hemiabdomen. No radio-opaque calculi or other significant radiographic abnormality are seen. IMPRESSION: Well-positioned gastrostomy tube, with contrast opacification of the remnant stomach antrum and proximal small bowel. Electronically Signed   By: Thom Hall M.D.   On: 10/06/2024 16:04   IR REPLACE G-TUBE COMPLEX WO FLUORO (TRACT REV) Result Date: 10/06/2024 INDICATION: Catheter malfunctioning, dislodged. Request received for gastrostomy tube exchange Briefly, 49 year old  female with complicated bariatric history, s/p sleeve gastrectomy and laparoscopic placement of gastrostomy tube into remnant stomach (09/14/2023). EXAM: GASTROSTOMY TUBE EXCHANGE MEDICATIONS: None ANESTHESIA/SEDATION: None CONTRAST:  None FLUOROSCOPY: None COMPLICATIONS: None immediate. PROCEDURE: Informed written consent was obtained from the patient after a thorough discussion of the procedural risks, benefits and alternatives. All questions were addressed. A timeout was performed prior to the initiation of the procedure. The existing Foley catheter was removed from the gastrostomy tube insertion site tract and a new 20 Fr balloon retention gastrostomy tube was inserted, balloon filled with 9 mL saline and secured to skin site. Tube was flushed without difficulty. Follow up abdominal film pending. IMPRESSION: Successful bedside exchange for a new 20 Fr balloon-retention gastrostomy tube. Performed by: Franky Rakers, PA-C under supervision of Thom Hall, MD Electronically Signed   By: Thom Hall M.D.   On: 10/06/2024 14:30   DG ABDOMEN PEG TUBE LOCATION Result Date: 10/05/2024 CLINICAL DATA:  Peg tube dysfunction. EXAM: ABDOMEN - 1 VIEW COMPARISON:  Abdomen and pelvis CT dated 10/02/2024 FINDINGS: The PEG tube tip and balloon are obscured by injected contrast in the distal stomach and duodenal bulb. No extravasated contrast is seen. Normal bowel-gas pattern. Left upper quadrant surgical clips and bilateral lower quadrant. Left sacral neural stimulator. Unremarkable bones. IMPRESSION: PEG tube tip and balloon are obscured by injected contrast in the distal stomach and duodenal bulb. No extravasated contrast is seen. Electronically Signed   By: Elspeth Bathe M.D.   On: 10/05/2024 13:55   CT ABDOMEN PELVIS W CONTRAST Result Date: 10/02/2024 CLINICAL DATA:  Hematemesis with fatigue and weakness. EXAM: CT ABDOMEN AND PELVIS WITH CONTRAST TECHNIQUE: Multidetector CT imaging of the abdomen and pelvis was performed  using the standard protocol following bolus administration of intravenous contrast. RADIATION DOSE REDUCTION: This exam was performed according to the departmental dose-optimization program which includes automated exposure control, adjustment of the mA and/or kV according to patient size and/or use of iterative reconstruction technique. CONTRAST:  OMNIPAQUE  IOHEXOL  300 MG/ML  SOLN COMPARISON:  September 23, 2024 FINDINGS: Lower chest: No acute abnormality. Hepatobiliary: There is diffuse fatty infiltration of the liver parenchyma. No focal liver abnormality is seen. Status post cholecystectomy. No biliary dilatation. Pancreas: Unremarkable. No pancreatic ductal dilatation  or surrounding inflammatory changes. Spleen: A 2.5 cm well-defined area of parenchymal low attenuation is seen within the anterior aspect of an otherwise normal-appearing spleen. Adrenals/Urinary Tract: Adrenal glands are unremarkable. Kidneys are normal, without renal calculi, focal lesion, or hydronephrosis. Urinary bladder is poorly distended and is otherwise unremarkable. Stomach/Bowel: Surgical sutures are seen throughout the gastric region with surgically anastomosed bowel noted within the mid left abdomen. A percutaneous gastrostomy tube is in place with surgical sutures seen adjacent to the site of insufflator bulb insertion. Appendix appears normal. No evidence of bowel dilatation. Moderate to markedly thickened, poorly distended small bowel loops are seen along the region distal to the gastric surgical anastomosis. Vascular/Lymphatic: No significant vascular findings are present. No enlarged abdominal or pelvic lymph nodes. Reproductive: Status post hysterectomy. No adnexal masses. Other: No abdominal wall hernia or abnormality. No abdominopelvic ascites. Musculoskeletal: No acute or significant osseous findings. IMPRESSION: 1. Moderate to markedly thickened, poorly distended small bowel loops along the region distal to the gastric  surgical anastomosis, which may represent sequelae associated with enteritis. 2. Hepatic steatosis. 3. Evidence of prior cholecystectomy and hysterectomy. 4. Percutaneous gastrostomy tube in place. Electronically Signed   By: Suzen Dials M.D.   On: 10/02/2024 19:32   CT ABDOMEN PELVIS W CONTRAST Result Date: 09/23/2024 EXAM: CT ABDOMEN AND PELVIS WITH CONTRAST 09/23/2024 01:54:00 PM TECHNIQUE: CT of the abdomen and pelvis was performed with the administration of intravenous contrast and 450 mL of oral barium sulfate  2% suspension (READI-CAT 2 ). Multiplanar reformatted images are provided for review. Automated exposure control, iterative reconstruction, and/or weight-based adjustment of the mA/kV was utilized to reduce the radiation dose to as low as reasonably achievable. COMPARISON: CT abdomen pelvis 08/21/2024 CLINICAL HISTORY: FINDINGS: LOWER CHEST: No acute abnormality. LIVER: The liver is unremarkable. GALLBLADDER AND BILE DUCTS: Gallbladder is unremarkable. No biliary ductal dilatation. SPLEEN: No acute abnormality. PANCREAS: No acute abnormality. ADRENAL GLANDS: No acute abnormality. KIDNEYS, URETERS AND BLADDER: No stones in the kidneys or ureters. No hydronephrosis. No perinephric or periureteral stranding. Urinary bladder is unremarkable. No filling defects of the partially visualized collecting systems on delayed imaging. GI AND BOWEL: Gastric bypass surgical changes are noted. Approximately 2 clips are noted with a balloon inflated within the gastric remnants. No small or large bowel thickening or dilatation. The appendix is unremarkable. PERITONEUM AND RETROPERITONEUM: No ascites. No free air. VASCULATURE: Aorta is normal in caliber. LYMPH NODES: No lymphadenopathy. REPRODUCTIVE ORGANS: No acute abnormality. BONES AND SOFT TISSUES: Dermal thickening along the gastrostomy tube insertion site as well as the umbilicus with associated suture staple. No organized fluid collection. No finding to  suggest definite enterocutaneous fistula. Tiny fat and free fluid hernia inferior to the gastrostomy tube and superior to the umbilicus (602.72) with abdominal defect of 1 mm. No acute osseous abnormality. Neurostimulator with path along the left gluteal soft tissues and tip terminating within the left presacral space. IMPRESSION: 1. No acute findings in the abdomen or pelvis. 2. Dermal thickening along the gastrostomy tube insertion site as well as the umbilicus with associated suture staple. No organized fluid collection. No finding to suggest definite enterocutaneous fistula. Tiny fat and free fluid hernia inferior to the gastrostomy tube and superior to the umbilicus (602.72) with abdominal defect of 1 mm. 3. Gastric bypass surgical changes with clip and balloon inflated within the gastric remnants. Electronically signed by: Morgane Naveau MD 09/23/2024 03:08 PM EST RP Workstation: [id number]      Subjective: Patient seen and examined at bedside.  Feels intermittently dizzy but feels okay to go home today.  No fever, chest pain or shortness of breath reported.  Discharge Exam: Vitals:   10/09/24 0038 10/09/24 0450  BP: (!) 101/57 106/75  Pulse: (!) 103 (!) 101  Resp: 19 20  Temp:  98.3 F (36.8 C)  SpO2: 100% 99%    General: Pt is alert, awake, not in acute distress.  On room air; looks chronically ill and deconditioned Cardiovascular: Intermittent tachycardia present, S1/S2 + Respiratory: bilateral decreased breath sounds at bases Abdominal: Soft, mildly tender, ND, bowel sounds +.  G-tube with dressing present. Extremities: no edema, no cyanosis    The results of significant diagnostics from this hospitalization (including imaging, microbiology, ancillary and laboratory) are listed below for reference.     Microbiology: No results found for this or any previous visit (from the past 240 hours).   Labs: BNP (last 3 results) No results for input(s): BNP in the last 8760  hours. Basic Metabolic Panel: Recent Labs  Lab 10/04/24 0531 10/05/24 0542 10/06/24 0547 10/07/24 0534 10/08/24 0521 10/09/24 0456  NA 140 140 139 138 139 137  K 4.1 4.3 4.3 4.2 4.6 4.4  CL 106 106 104 104 103 103  CO2 28 31 26 30 29 27   GLUCOSE 89 100* 140* 105* 92 115*  BUN 7 8 8 9 7 7   CREATININE 0.52 0.65 0.64 0.63 0.63 0.58  CALCIUM  9.1 8.8* 8.7* 8.5* 8.5* 8.7*  MG 2.2 2.2 2.3 2.4 2.5* 2.3  PHOS 2.7 3.9 4.2 4.2  --   --    Liver Function Tests: Recent Labs  Lab 10/02/24 1830  AST 13*  ALT 7  ALKPHOS 77  BILITOT <0.2  PROT 6.1*  ALBUMIN  3.2*   No results for input(s): LIPASE, AMYLASE in the last 168 hours. No results for input(s): AMMONIA in the last 168 hours. CBC: Recent Labs  Lab 10/05/24 0542 10/06/24 0547 10/07/24 0534 10/07/24 1916 10/08/24 0521 10/09/24 0456  WBC 6.4 7.1 5.3  --  9.4 12.5*  NEUTROABS 3.1 4.2 2.9  --  6.8 9.6*  HGB 7.2* 7.4* 7.0* 8.1* 8.7* 8.6*  HCT 23.8* 24.3* 23.2* 25.2* 27.8* 27.3*  MCV 97.1 94.6 96.7  --  94.6 94.1  PLT 306 333 295  --  339 342   Cardiac Enzymes: No results for input(s): CKTOTAL, CKMB, CKMBINDEX, TROPONINI in the last 168 hours. BNP: Invalid input(s): POCBNP CBG: Recent Labs  Lab 10/08/24 1651 10/08/24 2017 10/09/24 0010 10/09/24 0439 10/09/24 0738  GLUCAP 88 109* 211* 106* 100*   D-Dimer No results for input(s): DDIMER in the last 72 hours. Hgb A1c No results for input(s): HGBA1C in the last 72 hours. Lipid Profile No results for input(s): CHOL, HDL, LDLCALC, TRIG, CHOLHDL, LDLDIRECT in the last 72 hours. Thyroid  function studies No results for input(s): TSH, T4TOTAL, T3FREE, THYROIDAB in the last 72 hours.  Invalid input(s): FREET3 Anemia work up No results for input(s): VITAMINB12, FOLATE, FERRITIN, TIBC, IRON , RETICCTPCT in the last 72 hours. Urinalysis    Component Value Date/Time   COLORURINE YELLOW 08/21/2024 0106   APPEARANCEUR  CLEAR 08/21/2024 0106   APPEARANCEUR Cloudy (A) 12/15/2017 1722   LABSPEC >1.046 (H) 08/21/2024 0106   PHURINE 5.0 08/21/2024 0106   GLUCOSEU NEGATIVE 08/21/2024 0106   HGBUR NEGATIVE 08/21/2024 0106   BILIRUBINUR NEGATIVE 08/21/2024 0106   BILIRUBINUR negative 01/05/2020 1434   BILIRUBINUR Negative 12/15/2017 1722   KETONESUR NEGATIVE 08/21/2024 0106   PROTEINUR 100 (A) 08/21/2024  0106   UROBILINOGEN 0.2 01/05/2020 1434   UROBILINOGEN 0.2 11/29/2018 1742   NITRITE NEGATIVE 08/21/2024 0106   LEUKOCYTESUR NEGATIVE 08/21/2024 0106   Sepsis Labs Recent Labs  Lab 10/06/24 0547 10/07/24 0534 10/08/24 0521 10/09/24 0456  WBC 7.1 5.3 9.4 12.5*   Microbiology No results found for this or any previous visit (from the past 240 hours).   Time coordinating discharge: 35 minutes  SIGNED:   Sophie Mao, MD  [facility] 10/09/2024, 10:01 AM      [1]  Allergies Allergen Reactions   Ciprofloxacin  Itching, Dermatitis and Rash   Coconut (Cocos Nucifera) Anaphylaxis and Itching   Coconut Oil Anaphylaxis and Itching   Doxycycline Nausea And Vomiting   Oxycodone Anaphylaxis   Trazodone  And Nefazodone Rash   Hydroxyzine  Hives and Rash   Nefazodone Rash   Penicillins Hives, Rash and Other (See Comments)    Has patient had a PCN reaction causing immediate rash, facial/tongue/throat swelling, SOB or lightheadedness with hypotension: Yes   Silicone Rash   Tape Rash and Other (See Comments)    And, paper tape causes a rash if worn for a prolong period of time   Trazodone  Rash   Wound Dressing Adhesive Rash

## 2024-10-09 NOTE — Progress Notes (Signed)
 PT Cancellation Note  Patient Details Name: [name] MRN: [id number] DOB: [birth year]   Cancelled Treatment:    Reason Eval/Treat Not Completed: Fatigue/lethargy limiting ability to participate;Patient declined, no reason specified. Pt resting in bed; declined therapy session secondary to walking with MS earlier and an assisted fall in bathroom with RN secondary to dizziness/lightheadedness. PT reviewed safe mobility with patient including LE exercises to do before standing to help raise BP, monitoring symptoms and not standing or moving fast when dizzy, and having staff assist with mobility. Pt receptive and appreciative to education. PT will continue to follow.  Isaiah DEL. Ashlon Lottman, PT, DPT   [employer] 10/09/2024, 3:49 PM

## 2024-10-09 NOTE — Progress Notes (Addendum)
 RN notified MD that patient did not feel ready for discharge today. Night shift RN told day shift RN that patient was very unsteady during the night getting up to go to the bathroom, and that the night shift RN almost had to catch patient to prevent patient from falling. RN noticed that patient was also unsteady this morning walking to the bathroom. Patient told RN that when she was in the bathroom she felt dizzy, nodded off, and hit her head on the bathroom wall, but she did not fall. RN assessed patient's head and did not see any bruises, abrasions, scratches, etc. MD decided to hold off on discharging the patient and placed new orders.

## 2024-10-10 ENCOUNTER — Encounter (HOSPITAL_COMMUNITY): Payer: Self-pay | Admitting: Internal Medicine

## 2024-10-10 DIAGNOSIS — K922 Gastrointestinal hemorrhage, unspecified: Secondary | ICD-10-CM | POA: Diagnosis not present

## 2024-10-10 LAB — CBC WITH DIFFERENTIAL/PLATELET
Abs Immature Granulocytes: 0.01 K/uL (ref 0.00–0.07)
Basophils Absolute: 0 K/uL (ref 0.0–0.1)
Basophils Relative: 1 %
Eosinophils Absolute: 0.3 K/uL (ref 0.0–0.5)
Eosinophils Relative: 4 %
HCT: 26.7 % — ABNORMAL LOW (ref 36.0–46.0)
Hemoglobin: 8.1 g/dL — ABNORMAL LOW (ref 12.0–15.0)
Immature Granulocytes: 0 %
Lymphocytes Relative: 33 %
Lymphs Abs: 2.1 K/uL (ref 0.7–4.0)
MCH: 29.6 pg (ref 26.0–34.0)
MCHC: 30.3 g/dL (ref 30.0–36.0)
MCV: 97.4 fL (ref 80.0–100.0)
Monocytes Absolute: 0.6 K/uL (ref 0.1–1.0)
Monocytes Relative: 10 %
Neutro Abs: 3.4 K/uL (ref 1.7–7.7)
Neutrophils Relative %: 52 %
Platelets: 354 K/uL (ref 150–400)
RBC: 2.74 MIL/uL — ABNORMAL LOW (ref 3.87–5.11)
RDW: 17.1 % — ABNORMAL HIGH (ref 11.5–15.5)
WBC: 6.4 K/uL (ref 4.0–10.5)
nRBC: 0 % (ref 0.0–0.2)

## 2024-10-10 LAB — GLUCOSE, CAPILLARY
Glucose-Capillary: 87 mg/dL (ref 70–99)
Glucose-Capillary: 92 mg/dL (ref 70–99)
Glucose-Capillary: 126 mg/dL — ABNORMAL HIGH (ref 70–99)
Glucose-Capillary: 112 mg/dL — ABNORMAL HIGH (ref 70–99)
Glucose-Capillary: 83 mg/dL (ref 70–99)
Glucose-Capillary: 105 mg/dL — ABNORMAL HIGH (ref 70–99)

## 2024-10-10 LAB — BASIC METABOLIC PANEL WITH GFR
Anion gap: 4 — ABNORMAL LOW (ref 5–15)
BUN: 5 mg/dL — ABNORMAL LOW (ref 6–20)
CO2: 30 mmol/L (ref 22–32)
Calcium: 8.6 mg/dL — ABNORMAL LOW (ref 8.9–10.3)
Chloride: 106 mmol/L (ref 98–111)
Creatinine, Ser: 0.59 mg/dL (ref 0.44–1.00)
GFR, Estimated: >60 mL/min
Glucose, Bld: 87 mg/dL (ref 70–99)
Potassium: 4.4 mmol/L (ref 3.5–5.1)
Sodium: 139 mmol/L (ref 135–145)

## 2024-10-10 LAB — MAGNESIUM: Magnesium: 2.5 mg/dL — ABNORMAL HIGH (ref 1.7–2.4)

## 2024-10-10 NOTE — Progress Notes (Signed)
 Mobility Specialist Progress Note:   10/10/24 1110  Mobility  Activity Ambulated with assistance  Level of Assistance Contact guard assist, steadying assist  Assistive Device Front wheel walker  Distance Ambulated (ft) 110 ft  Activity Response Tolerated well  Mobility Referral Yes  Mobility visit 1 Mobility  Mobility Specialist Start Time (ACUTE ONLY) [id number]  Mobility Specialist Stop Time (ACUTE ONLY) 1010  Mobility Specialist Time Calculation (min) (ACUTE ONLY) 19 min   Pt was received in bed and agreed to mobility. Pt had slight dizziness to edge of bed but quickly subsided. CGA for safety. Returned to bed with all needs met. Call bell in reach.  [employer] - Mobility Specialist

## 2024-10-10 NOTE — Progress Notes (Signed)
 " PROGRESS NOTE    [name]  FMW:[id number] DOB: Jun 07, 1976 DOA: 10/02/2024 PCP: Orion Kerns, MD   Brief Narrative:  [phone] with h/o remote complicated bariatric surgery resulting in recurrent UGI bleeding secondary to ulcers in the area of her GJ tube, chronic abdominal pain on opiates, chronic blood loss anemia requiring IV iron  infusions presented with coffee-ground emesis.  CT of the abdomen showed thickened small bowel loops along the region distal to the gastric surgical anastomosis consistent with enteritis.  She was started on IV PPI.  GI and general surgery were consulted.  She underwent EGD on 10/03/2024. She had issues with leaking from G-tube which has been fixed.  She was supposed to be discharged home on 10/09/2024 but discharge was held because of dizziness and patient feeling not ready to be discharged.  Assessment & Plan:   Upper GI bleeding Acute on chronic blood loss anemia Chronic marginal ulcer with perforation status post Arlyss omental repair on 08/21/2024 -Five weeks s/p surgical repair of perforated stomach ulcer. - Hemoglobin 7.0 on 10/07/2024: Status post 1 unit packed red cell transfusion.  Hemoglobin 8.1 this morning.  No overt signs of bleeding since admission.  Monitor H&H. Continue IV PPI along with oral sucralfate . - Status post EGD on 10/03/2024: Showed acute  gastrojejunal anastomosis with ulceration and visible stuures; pouch-to-jejunum limb with ulceration and jejunojejunal anastomosis also with ulceration; 3 non-bleeding cratered gastric ulcers with pigmented material at anastomosis EGD reported chronic marginal ulcer that would need surgical evaluation for revision of anastomosis.  GI has signed off and recommended only full liquid diet. - General Surgery following: Currently no need for any surgical intervention but if GI bleeding recurs, patient might need transfer to [facility]. - Continue misoprostol  and mesalamine   Severe protein calorie  malnutrition - Continue tube feeding as per dietitian recommendations - Patient had issues with leaking G-tube.  General surgery was involved regarding the same: Now have signed off.  IR exchanged the Foley catheter in G-tube on 10/06/2024. - Currently tolerating tube feeding  Dizziness - Discharge held on 10/09/2024 for dizziness and patient felt not ready for discharge.  Still dizzy this morning and does not feel ready to be discharged.  Patient states that she is on scheduled meclizine  at home.  Continue meclizine  as needed inpatient.  Will await for PT reevaluation today.  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  Outpatient follow-up with pain management provided  Chronic constipation -Continue Linzess . Patient has not been able to bring her own Symproic  from home.  Continue Senokot.  Continue lactulose  via tube  Orthostatic hypotension -continue midodrine   DVT prophylaxis: SCDs Code Status: Full Family Communication: None at bedside Disposition Plan: Status is: Inpatient Remains inpatient appropriate because: Of severity of illness.    Consultants: GI/general surgery  Procedures: As above  Antimicrobials: None   Subjective: Patient seen and examined at bedside.  Continues to feel intermittently dizzy and does not feel ready to be discharged home today as well.  No fever, vomiting, worsening abdominal pain reported.   Objective: Vitals:   10/09/24 1417 10/09/24 2102 10/10/24 0401 10/10/24 0544  BP: 102/71 106/69 (!) 91/55 101/65  Pulse: 84 72 77 73  Resp: 16 18 18 19   Temp: 98 F (36.7 C) 98.8 F (37.1 C)  98.5 F (36.9 C)   TempSrc: Oral Oral Oral   SpO2: 99% 100% 100% 100%  Weight:       Height:        Intake/Output Summary (Last 24 hours) at 10/10/2024 0954 Last data filed at 10/10/2024 0322 Gross per 24 hour  Intake 2817 ml  Output 0 ml  Net 2817 ml   Filed Weights   10/06/24 0500 10/08/24 0826 10/09/24 0450  Weight: 49.3 kg 56 kg 56.7 kg    Examination:  General: No distress.  On room air currently  ENT/neck: No JVD elevation or palpable neck masses noted respiratory: Decreased breath sounds at bases bilaterally with scattered crackles CVS: Rate currently controlled; S1 and S2 are heard abdominal: Soft, mildly distended; no organomegaly, normal bowel sounds are heard.  G-tube present with dressing  extremities: Trace lower extremity edema present; no cyanosis CNS: Awake and alert.  No obvious focal deficits noted. Lymph: No lymphadenopathy noted skin: No obvious ecchymosis/lesions psych: Affect is flat with no agitation  musculoskeletal: No obvious joint ecchymosis/lesions    Data Reviewed: I have personally reviewed following labs and imaging studies  CBC: Recent Labs  Lab 10/06/24 0547 10/07/24 0534 10/07/24 1916 10/08/24 0521 10/09/24 0456 10/10/24 0517  WBC 7.1 5.3  --  9.4 12.5* 6.4  NEUTROABS 4.2 2.9  --  6.8 9.6* 3.4  HGB 7.4* 7.0* 8.1* 8.7* 8.6* 8.1*  HCT 24.3* 23.2* 25.2* 27.8* 27.3* 26.7*  MCV 94.6 96.7  --  94.6 94.1 97.4  PLT 333 295  --  339 342 354   Basic Metabolic Panel: Recent Labs  Lab 10/04/24 0531 10/05/24 0542 10/06/24 0547 10/07/24 0534 10/08/24 0521 10/09/24 0456 10/10/24 0517  NA 140 140 139 138 139 137 139  K 4.1 4.3 4.3 4.2 4.6 4.4 4.4  CL 106 106 104 104 103 103 106  CO2 28 31 26 30 29 27 30   GLUCOSE 89 100* 140* 105* 92 115* 87  BUN 7 8 8 9 7 7  5*  CREATININE 0.52 0.65 0.64 0.63 0.63 0.58 0.59  CALCIUM  9.1 8.8* 8.7* 8.5* 8.5* 8.7* 8.6*  MG 2.2 2.2 2.3 2.4 2.5* 2.3 2.5*  PHOS 2.7 3.9 4.2 4.2  --   --   --    GFR: Estimated Creatinine Clearance: 78.7 mL/min (by C-G formula based on SCr of 0.59  mg/dL). Liver Function Tests: No results for input(s): AST, ALT, ALKPHOS, BILITOT, PROT, ALBUMIN  in the last 168 hours.  No results for input(s): LIPASE, AMYLASE in the last 168 hours. No results for input(s): AMMONIA in the last 168 hours. Coagulation Profile: No results for input(s): INR, PROTIME in the last 168 hours.  Cardiac Enzymes: No results for input(s): CKTOTAL, CKMB, CKMBINDEX, TROPONINI in the last 168 hours. BNP (last 3 results) No results for input(s): PROBNP in the last 8760 hours. HbA1C: No results for input(s): HGBA1C in the last 72 hours. CBG: Recent Labs  Lab 10/09/24 1644 10/09/24 2059 10/09/24 2323 10/10/24 0403 10/10/24 0743  GLUCAP 109* 101* 87 92 126*   Lipid Profile: No results for input(s): CHOL, HDL, LDLCALC, TRIG, CHOLHDL, LDLDIRECT in the last 72 hours. Thyroid  Function Tests: No results for input(s): TSH, T4TOTAL, FREET4, T3FREE, THYROIDAB in the last 72 hours. Anemia Panel: No results for input(s): VITAMINB12, FOLATE, FERRITIN, TIBC, IRON , RETICCTPCT in the last 72 hours. Sepsis Labs: No results for input(s): PROCALCITON, LATICACIDVEN in the last 168 hours.   No results found for this or any previous visit (from  the past 240 hours).       Radiology Studies: No results found.        Scheduled Meds:  amitriptyline   50 mg Oral QHS   atorvastatin   40 mg Oral Daily   baclofen   10 mg Oral TID   buPROPion   450 mg Oral Daily   busPIRone   15 mg Oral TID   DULoxetine   60 mg Oral BID   feeding supplement (OSMOLITE 1.5 CAL)  1,000 mL Per Tube Q24H   ferrous sulfate   325 mg Oral Q breakfast   gabapentin   900 mg Oral TID   HYDROmorphone   4 mg Oral Q6H   lactulose   30 g Per Tube TID   linaclotide   290 mcg Oral QAC breakfast   mesalamine   2.4 g Oral BID   midodrine   10 mg Oral TID WC   misoprostol   200 mcg Oral Daily   multivitamin  15 mL Oral Daily   nystatin   5  mL Oral QID   oxybutynin   10 mg Oral QHS   pantoprazole  (PROTONIX ) IV  40 mg Intravenous Q12H   Ensure Max Protein  11 oz Oral BID   senna-docusate  1 tablet Oral BID   sodium chloride  flush  3 mL Intravenous Q12H   sucralfate   1 g Oral TID WC & HS   Continuous Infusions:          Sophie Mao, MD [facility] 10/10/2024, 9:54 AM   "

## 2024-10-10 NOTE — Plan of Care (Signed)
  Problem: Education: Goal: Knowledge of General Education information will improve Description: Including pain rating scale, medication(s)/side effects and non-pharmacologic comfort measures Outcome: Progressing   Problem: Activity: Goal: Risk for activity intolerance will decrease Outcome: Progressing   Problem: Nutrition: Goal: Adequate nutrition will be maintained Outcome: Progressing   Problem: Coping: Goal: Level of anxiety will decrease Outcome: Progressing   Problem: Elimination: Goal: Will not experience complications related to bowel motility Outcome: Progressing Goal: Will not experience complications related to urinary retention Outcome: Progressing   Problem: Pain Managment: Goal: General experience of comfort will improve and/or be controlled Outcome: Progressing   Problem: Safety: Goal: Ability to remain free from injury will improve Outcome: Progressing   Problem: Skin Integrity: Goal: Risk for impaired skin integrity will decrease Outcome: Progressing

## 2024-10-10 NOTE — Progress Notes (Signed)
 Mobility Specialist Progress Note:  112/76 mmHg BP   10/10/24 1515  Mobility  Activity Ambulated with assistance  Level of Assistance Contact guard assist, steadying assist  Assistive Device Front wheel walker;Wheelchair  Distance Ambulated (ft) 100 ft  Activity Response Tolerated fair  Mobility Referral Yes  Mobility visit 1 Mobility  Mobility Specialist Start Time (ACUTE ONLY) 1333  Mobility Specialist Stop Time (ACUTE ONLY) 1410  Mobility Specialist Time Calculation (min) (ACUTE ONLY) 37 min   Pt was received in bed and agreed to mobility. Pt stated feeling lightheaded during ambulation, instructed to sit in chair for relief. Checked BP. Returned to bed via chair with all needs met. Call bell in reach. RN notified.  [employer] - Mobility Specialist

## 2024-10-11 DIAGNOSIS — G894 Chronic pain syndrome: Secondary | ICD-10-CM | POA: Diagnosis not present

## 2024-10-11 DIAGNOSIS — K285 Chronic or unspecified gastrojejunal ulcer with perforation: Secondary | ICD-10-CM | POA: Diagnosis not present

## 2024-10-11 DIAGNOSIS — F418 Other specified anxiety disorders: Secondary | ICD-10-CM | POA: Diagnosis not present

## 2024-10-11 DIAGNOSIS — E43 Unspecified severe protein-calorie malnutrition: Secondary | ICD-10-CM | POA: Insufficient documentation

## 2024-10-11 DIAGNOSIS — D529 Folate deficiency anemia, unspecified: Secondary | ICD-10-CM | POA: Diagnosis not present

## 2024-10-11 DIAGNOSIS — E785 Hyperlipidemia, unspecified: Secondary | ICD-10-CM

## 2024-10-11 DIAGNOSIS — K5909 Other constipation: Secondary | ICD-10-CM

## 2024-10-11 DIAGNOSIS — D500 Iron deficiency anemia secondary to blood loss (chronic): Secondary | ICD-10-CM | POA: Diagnosis not present

## 2024-10-11 DIAGNOSIS — D62 Acute posthemorrhagic anemia: Secondary | ICD-10-CM

## 2024-10-11 DIAGNOSIS — I951 Orthostatic hypotension: Secondary | ICD-10-CM | POA: Diagnosis not present

## 2024-10-11 DIAGNOSIS — Z931 Gastrostomy status: Secondary | ICD-10-CM | POA: Diagnosis not present

## 2024-10-11 LAB — BASIC METABOLIC PANEL WITH GFR
Anion gap: 7 (ref 5–15)
BUN: 6 mg/dL (ref 6–20)
CO2: 27 mmol/L (ref 22–32)
Calcium: 8.6 mg/dL — ABNORMAL LOW (ref 8.9–10.3)
Chloride: 105 mmol/L (ref 98–111)
Creatinine, Ser: 0.54 mg/dL (ref 0.44–1.00)
GFR, Estimated: >60 mL/min
Glucose, Bld: 74 mg/dL (ref 70–99)
Potassium: 4.4 mmol/L (ref 3.5–5.1)
Sodium: 139 mmol/L (ref 135–145)

## 2024-10-11 LAB — CBC WITH DIFFERENTIAL/PLATELET
Abs Immature Granulocytes: 0.02 K/uL (ref 0.00–0.07)
Basophils Absolute: 0 K/uL (ref 0.0–0.1)
Basophils Relative: 0 %
Eosinophils Absolute: 0.3 K/uL (ref 0.0–0.5)
Eosinophils Relative: 3 %
HCT: 26.6 % — ABNORMAL LOW (ref 36.0–46.0)
Hemoglobin: 8 g/dL — ABNORMAL LOW (ref 12.0–15.0)
Immature Granulocytes: 0 %
Lymphocytes Relative: 22 %
Lymphs Abs: 1.9 K/uL (ref 0.7–4.0)
MCH: 29.1 pg (ref 26.0–34.0)
MCHC: 30.1 g/dL (ref 30.0–36.0)
MCV: 96.7 fL (ref 80.0–100.0)
Monocytes Absolute: 0.7 K/uL (ref 0.1–1.0)
Monocytes Relative: 8 %
Neutro Abs: 5.7 K/uL (ref 1.7–7.7)
Neutrophils Relative %: 67 %
Platelets: 376 K/uL (ref 150–400)
RBC: 2.75 MIL/uL — ABNORMAL LOW (ref 3.87–5.11)
RDW: 16.7 % — ABNORMAL HIGH (ref 11.5–15.5)
WBC: 8.6 K/uL (ref 4.0–10.5)
nRBC: 0 % (ref 0.0–0.2)

## 2024-10-11 LAB — GLUCOSE, CAPILLARY
Glucose-Capillary: 100 mg/dL — ABNORMAL HIGH (ref 70–99)
Glucose-Capillary: 130 mg/dL — ABNORMAL HIGH (ref 70–99)
Glucose-Capillary: 73 mg/dL (ref 70–99)
Glucose-Capillary: 87 mg/dL (ref 70–99)
Glucose-Capillary: 88 mg/dL (ref 70–99)
Glucose-Capillary: 92 mg/dL (ref 70–99)
Glucose-Capillary: 97 mg/dL (ref 70–99)

## 2024-10-11 LAB — MAGNESIUM: Magnesium: 2.4 mg/dL (ref 1.7–2.4)

## 2024-10-11 LAB — PREPARE RBC (CROSSMATCH)

## 2024-10-11 MED ORDER — ACETAMINOPHEN 325 MG PO TABS
650.0000 mg | ORAL_TABLET | Freq: Once | ORAL | Status: AC
  Administered 2024-10-11: 650 mg via ORAL
  Filled 2024-10-11: qty 2

## 2024-10-11 MED ORDER — SODIUM CHLORIDE 0.9% IV SOLUTION: Freq: Once | INTRAVENOUS | Status: AC

## 2024-10-11 NOTE — Progress Notes (Signed)
 Physical Therapy Treatment Patient Details Name: [name] MRN: [id number] DOB: 14-Aug-1976 Today's Date: 10/11/2024   History of Present Illness Pt is a 49 y.o. female who presented with coffee-ground emesis, upper GI bleed. CT of the abdomen showed thickened small bowel loops along the region distal to the gastric surgical anastomosis consistent with enteritis. Pt with h/o remote complicated bariatric surgery resulting in recurrent UGI bleeding secondary to ulcers in the area of her GJ tube. chronic abdominal pain on opiates, chronic blood loss anemia requiring IV iron  infusions. She underwent EGD on 10/03/2024.    PT Comments  Pt is progressing well with mobility. She ambulated 140' with a RW, no loss of balance. Pt reported mild dizziness with walking. BP sitting 109/80, standing 81/55, RN and MD notified of orthostatic hypotension. Noted that pt received midodrine  ~15 minutes prior to PT session.   If plan is discharge home, recommend the following: Assistance with cooking/housework;Assist for transportation;Help with stairs or ramp for entrance   Can travel by private vehicle        Equipment Recommendations  None recommended by PT    Recommendations for Other Services       Precautions / Restrictions Precautions Precautions: Fall Recall of Precautions/Restrictions: Intact Precaution/Restrictions Comments: orthostatic, fell in hosptial Restrictions Weight Bearing Restrictions Per Provider Order: No     Mobility  Bed Mobility               General bed mobility comments: up on edge of bed at start of session    Transfers Overall transfer level: Needs assistance Equipment used: Rolling walker (2 wheels) Transfers: Sit to/from Stand Sit to Stand: Supervision           General transfer comment: VCs for hand placement    Ambulation/Gait Ambulation/Gait assistance: Supervision Gait Distance (Feet): 140 Feet Assistive device: Rolling walker (2  wheels) Gait Pattern/deviations: WFL(Within Functional Limits) Gait velocity: decreased     General Gait Details: steady, no loss of balance, reported mild dizziness in standing. BP 109/80 sitting, 81/55 standing. RN and MD notified of orthostatic hypotension.   Stairs             Wheelchair Mobility     Tilt Bed    Modified Rankin (Stroke Patients Only)       Balance Overall balance assessment: Modified Independent                                          Communication Communication Communication: No apparent difficulties  Cognition Arousal: Alert Behavior During Therapy: WFL for tasks assessed/performed   PT - Cognitive impairments: No apparent impairments                         Following commands: Intact      Cueing    Exercises      General Comments        Pertinent Vitals/Pain Pain Assessment Pain Assessment: No/denies pain    Home Living                          Prior Function            PT Goals (current goals can now be found in the care plan section) Acute Rehab PT Goals Patient Stated Goal: get stronger PT Goal Formulation: With patient Time For Goal  Achievement: 10/19/24 Potential to Achieve Goals: Good Progress towards PT goals: Progressing toward goals    Frequency    Min 3X/week      PT Plan      Co-evaluation              AM-PAC PT 6 Clicks Mobility   Outcome Measure  Help needed turning from your back to your side while in a flat bed without using bedrails?: None Help needed moving from lying on your back to sitting on the side of a flat bed without using bedrails?: None Help needed moving to and from a bed to a chair (including a wheelchair)?: None Help needed standing up from a chair using your arms (e.g., wheelchair or bedside chair)?: None Help needed to walk in hospital room?: A Little Help needed climbing 3-5 steps with a railing? : A Little 6 Click Score:  22    End of Session Equipment Utilized During Treatment: Gait belt Activity Tolerance: Patient tolerated treatment well Patient left: in bed;with call bell/phone within reach;with bed alarm set Nurse Communication: Mobility status;Other (comment) (orthostatic vitals) PT Visit Diagnosis: Unsteadiness on feet (R26.81);Muscle weakness (generalized) (M62.81);Other abnormalities of gait and mobility (R26.89)     Time: 8795-8777 PT Time Calculation (min) (ACUTE ONLY): 18 min  Charges:    $Gait Training: 8-22 mins PT General Charges $$ ACUTE PT VISIT: 1 Visit                     Sylvan Nest Kistler PT 10/11/2024  Acute Rehabilitation Services  Office [phone]

## 2024-10-11 NOTE — Progress Notes (Signed)
 " PROGRESS NOTE    [name]  FMW:[id number] DOB: 1976-06-28 DOA: 10/02/2024 PCP: Orion Kerns, MD    Chief Complaint  Patient presents with   Hematemesis    Brief Narrative:  [phone] with h/o remote complicated bariatric surgery resulting in recurrent UGI bleeding secondary to ulcers in the area of her GJ tube, chronic abdominal pain on opiates, chronic blood loss anemia requiring IV iron  infusions presented with coffee-ground emesis.  CT of the abdomen showed thickened small bowel loops along the region distal to the gastric surgical anastomosis consistent with enteritis.  She was started on IV PPI.  GI and general surgery were consulted.  She underwent EGD on 10/03/2024. She had issues with leaking from G-tube which has been fixed.  She was supposed to be discharged home on 10/09/2024 but discharge was held because of dizziness and patient feeling not ready to be discharged.    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 - Hemoglobin noted on admission 7.0 on 10/07/2024. - Status post transfusion 1 unit PRBCs with hemoglobin currently at 8.0 this morning. - Patient still with complaints of lightheadedness and dizziness when working with PT and noted to be orthostatic per PT. - Patient denies any overt bleeding. -  Patient underwent CT abdomen and pelvis that showed moderate to markedly thickened and poorly distended small bowel loops along the region distal to the gastric surgical anastomosis, which may represent sequelae associated with enteritis.  Hepatic steatosis.  Evidence of prior cholecystectomy and hysterectomy.  Percutaneous gastrostomy tube in place. - Patient underwent EGD 10/03/2024 that showed acute gastrojejunal anastomosis with ulceration and visible sutures; partial to jejunum limb with ulceration and jejunojejunal anastomosis also with ulceration; 3 nonbleeding cratered gastric ulcers with pigmented material at anastomosis. - EGD reported chronic marginal ulcer that will need surgical evaluation for revision of anastomosis. - GI signed off and recommended full liquid diet. - General Surgery was following and no need for any surgical intervention but per general surgery with GI bleeding recurs patient might need to be transferred back to [facility]. - Continue metoprolol  and mesalamine . - Due to orthostasis, ongoing dizziness will transfuse 2 units PRBCs. - Follow-up.  2.  Dizziness/orthostasis -It is noted that discharge was held 10/09/2024 due to dizziness and patient felt not ready for discharge. - Patient still with ongoing dizziness. - Patient noted to prior physician that she is on scheduled meclizine  at home. - Patient assessed by PT noted to be orthostatic. - Transfused 2 units PRBCs. - Continue midodrine  and may need to uptitrate if patient with persistent orthostasis despite transfusion of PRBCs.  3.  Severe protein calorie malnutrition -Patient noted to have issues with leaking G-tube. - General Surgery was following but have signed off. - IR exchanged Foley catheter and G-tube on 10/06/2024. - Patient tolerating tube feeds. - Continue current tube feeds.  4.  Folate deficiency anemia secondary to malabsorption/iron  deficiency anemia -Continue iron  supplementation. -Outpatient  follow-up.  5.  Hyperlipidemia -Statin.  6.  Depression/anxiety -Continue amitriptyline , bupropion , buspirone , duloxetine , Klonopin  as needed.  7.  Chronic pain with opiate dependence -Continue current home pain regimen. -Outpatient follow-up.  8.  Chronic constipation -Continue Linzess . -Continue Senokot, lactulose . -Patient noted not to have been able to bring her own Symproic  from home.  9.  Orthostatic hypotension -Continue midodrine  10 mg 3 times daily. -Patient to receive transfusion 2 units PRBCs. -If patient still symptomatic and orthostatic posttransfusion may need to uptitrate midodrine . -As noted above.   DVT prophylaxis: SCDs Code Status: Full Family Communication: Updated patient.  No family at bedside. Disposition: Home when clinically improved  Status is: Inpatient Remains inpatient appropriate because: Severity of illness   Consultants:  Gastroenterology: Dr. Dianna 10/03/2024  Procedures:  Upper endoscopy 10/03/2024-Dr. Dianna CT abdomen and pelvis 10/02/2024 IR replaced G-tube 10/06/2024 per Darrell Allred, PA Transfused 1 unit PRBCs 10/07/2024 Transfused 2 units PRBCs pending 10/11/2024  Antimicrobials:  Anti-infectives (From admission, onward)    None         Subjective: Patient sleeping but easily arousable.  Patient drifts back off to sleep.  Patient denies any chest pain or shortness of breath.  Denies any abdominal pain.  Denies any hematemesis or bleeding.  Noted by PT to still be orthostatic.  Objective: Vitals:   10/11/24 1616 10/11/24 1652 10/11/24 1751 10/11/24 1916  BP: 110/71 106/72 106/72 98/60  Pulse: 75 78 80 87  Resp: 18 19 19 18   Temp: 98.3 F (36.8 C) 98.2 F (36.8 C) 98 F (36.7 C) 98.4 F (36.9 C)  TempSrc: Oral Oral  Oral  SpO2: 100% 99%  98%  Weight:      Height:        Intake/Output Summary (Last 24 hours) at 10/11/2024 1958 Last data filed at 10/11/2024 1916 Gross per 24 hour  Intake 1652.25 ml  Output 0 ml   Net 1652.25 ml   Filed Weights   10/09/24 0450 10/11/24 0500 10/11/24 0500  Weight: 56.7 kg 61.2 kg 57.1 kg    Examination:  General exam: Appears calm and comfortable  Respiratory system: Clear to auscultation anterior lung fields.  No wheezes, no crackles, no rhonchi.SABRA Respiratory effort normal. Cardiovascular system: S1 & S2 heard, RRR. No JVD, murmurs, rubs, gallops or clicks. No pedal edema. Gastrointestinal system: Abdomen is nondistended, soft and nontender. No organomegaly or masses felt. Normal bowel sounds heard. Central nervous system: Alert and oriented. No focal neurological deficits. Extremities: Symmetric 5 x 5 power. Skin: No rashes, lesions or ulcers Psychiatry: Judgement and insight appear normal. Mood & affect appropriate.     Data Reviewed: I have personally reviewed following labs and imaging studies  CBC: Recent Labs  Lab 10/07/24 0534 10/07/24 1916 10/08/24 0521 10/09/24 0456 10/10/24 0517 10/11/24 0539  WBC 5.3  --  9.4 12.5* 6.4 8.6  NEUTROABS 2.9  --  6.8 9.6* 3.4 5.7  HGB 7.0* 8.1* 8.7* 8.6* 8.1* 8.0*  HCT 23.2* 25.2* 27.8* 27.3* 26.7* 26.6*  MCV 96.7  --  94.6 94.1 97.4 96.7  PLT 295  --  339 342 354 376    Basic Metabolic Panel: Recent Labs  Lab 10/05/24 0542 10/06/24 0547 10/07/24 0534 10/08/24 0521 10/09/24 0456 10/10/24 0517 10/11/24 0539  NA 140 139 138 139 137 139 139  K 4.3 4.3 4.2 4.6 4.4 4.4 4.4  CL 106 104 104 103 103 106 105  CO2 31 26 30 29 27 30 27   GLUCOSE 100* 140* 105* 92 115* 87 74  BUN 8 8 9 7 7  5* 6  CREATININE 0.65 0.64 0.63 0.63 0.58 0.59 0.54  CALCIUM  8.8* 8.7* 8.5* 8.5* 8.7* 8.6* 8.6*  MG 2.2 2.3 2.4 2.5* 2.3 2.5* 2.4  PHOS 3.9 4.2 4.2  --   --   --   --     GFR: Estimated Creatinine Clearance: 79.2 mL/min (by C-G formula based on SCr of 0.54 mg/dL).  Liver Function Tests: No results for input(s): AST, ALT, ALKPHOS, BILITOT, PROT, ALBUMIN  in the last 168 hours.  CBG: Recent Labs  Lab  10/11/24 0011 10/11/24 0439 10/11/24 0806 10/11/24 1152 10/11/24 1649  GLUCAP 87 100* 73 97 88     No results found for this or any previous visit (from the past 240 hours).       Radiology Studies: No results found.      Scheduled Meds:  amitriptyline   50 mg Oral QHS   atorvastatin   40 mg Oral Daily   baclofen   10 mg Oral TID   buPROPion   450 mg Oral Daily   busPIRone   15 mg Oral TID   DULoxetine   60 mg Oral BID   feeding supplement (OSMOLITE 1.5 CAL)  1,000 mL Per Tube Q24H   ferrous sulfate   325 mg Oral Q breakfast   gabapentin   900 mg Oral TID   HYDROmorphone   4 mg Oral Q6H   lactulose   30 g Per Tube TID   linaclotide   290 mcg Oral QAC breakfast   mesalamine   2.4 g Oral BID   midodrine   10 mg Oral TID WC   misoprostol   200 mcg Oral Daily   multivitamin  15 mL Oral Daily   nystatin   5 mL Oral QID   oxybutynin   10 mg Oral QHS   pantoprazole  (PROTONIX ) IV  40 mg Intravenous Q12H   Ensure Max Protein  11 oz Oral BID   senna-docusate  1 tablet Oral BID   sodium chloride  flush  3 mL Intravenous Q12H   sucralfate   1 g Oral TID WC & HS   Continuous Infusions:   LOS: 9 days    Time spent: 40 minutes    Toribio Hummer, MD [facility]   To contact the attending provider between 7A-7P or the covering provider during after hours 7P-7A, please log into the web site [email or web address] and access using universal [place] password for that web site. If you do not have the password, please call the hospital operator.  10/11/2024, 7:58 PM    "

## 2024-10-11 NOTE — Plan of Care (Incomplete)
" °  Problem: Education: Goal: Knowledge of General Education information will improve Description: Including pain rating scale, medication(s)/side effects and non-pharmacologic comfort measures Outcome: Progressing   Problem: Health Behavior/Discharge Planning: Goal: Ability to manage health-related needs will improve Outcome: Progressing   Problem: Clinical Measurements: Goal: Ability to maintain clinical measurements within normal limits will improve Outcome: Progressing Goal: Diagnostic test results will improve Outcome: Progressing   Problem: Activity: Goal: Risk for activity intolerance will decrease Outcome: Progressing   Problem: Nutrition: Goal: Adequate nutrition will be maintained Outcome: Progressing   Problem: Clinical Measurements: Goal: Will remain free from infection Outcome: Adequate for Discharge Goal: Respiratory complications will improve Outcome: Adequate for Discharge Goal: Cardiovascular complication will be avoided Outcome: Adequate for Discharge   "

## 2024-10-11 NOTE — Progress Notes (Signed)
 Nutrition Follow-up  DOCUMENTATION CODES:   Severe malnutrition in context of chronic illness  INTERVENTION:   -Continue Osmolite 1.5 @ 45 ml/hr x 14 hours (1800-0800) via G-tube -Provides 945 kcals, 39g protein and 480 ml H2O   After discharge patient can resume home regimen of: -Nutren 2.0 @ 52 ml/hr x 14 hours via G-tube -Provides 1500 kcals, 63g protein and 519 ml H2O -Continue daily MVI via tube -Free water : 100 ml every 4 hours   -Ensure MAX Protein po BID as tolerated, each supplement provides 150 kcal and 30 grams of protein  NUTRITION DIAGNOSIS:   Severe Malnutrition related to chronic illness, altered GI function as evidenced by severe fat depletion, severe muscle depletion.  Ongoing.  GOAL:   Patient will meet greater than or equal to 90% of their needs  Progressing.  MONITOR:   PO intake, Supplement acceptance, TF tolerance   ASSESSMENT:   49yo with h/o remote complicated bariatric surgery resulting in recurrent UGI bleeding secondary to ulcers in the area of her GJ tube who presented on 1/5 with hematemesis.  She has chronic abdominal pain for which she takes opiates and chronic blood loss anemia for which she receives IV iron  infusions.  She had 8 episodes of coffee ground emesis in the ER on the say of presentation.  CT with thickened small bowel loops along the region distal to the gastric surgical anastomosis c/w enteritis.  1/5: admitted 1/6: EGD: non-bleeding gastric ulcers, GJ anastomosis with ulceration and visible sutures  1/9: G-tube replaced by IR  Patient tolerating Osmolite 1.5 @ 45 ml/hr x 14 hours. Still on full liquids, consuming 80-100% of meals. Accepting Ensure Max. Now having BMs.  Admission weight: 122 lbs Current weight: 125 lbs I/Os: +13.9L since admit  Medications: Ferrous sulfate , Lactulose , Linzess , Liquid MVI, Senokot, Carafate , Zofran   Labs reviewed: CBGs: 73-97   Diet Order:   Diet Order             Diet full  liquid Fluid consistency: Thin  Diet effective now                   EDUCATION NEEDS:   Not appropriate for education at this time  Skin:  Skin Assessment: Reviewed RN Assessment  Last BM:  1/13  Height:   Ht Readings from Last 1 Encounters:  10/03/24 5' 7 (1.702 m)    Weight:   Wt Readings from Last 1 Encounters:  10/11/24 57.1 kg    BMI:  Body mass index is 19.72 kg/m.  Estimated Nutritional Needs:   Kcal:  1700-1900  Protein:  85-95g  Fluid:  1.9L/day   Morna Lee, MS, RD, LDN Inpatient Clinical Dietitian Contact via Secure chat

## 2024-10-12 DIAGNOSIS — D529 Folate deficiency anemia, unspecified: Secondary | ICD-10-CM | POA: Diagnosis not present

## 2024-10-12 DIAGNOSIS — E785 Hyperlipidemia, unspecified: Secondary | ICD-10-CM | POA: Diagnosis not present

## 2024-10-12 DIAGNOSIS — G894 Chronic pain syndrome: Secondary | ICD-10-CM | POA: Diagnosis not present

## 2024-10-12 DIAGNOSIS — K285 Chronic or unspecified gastrojejunal ulcer with perforation: Secondary | ICD-10-CM | POA: Diagnosis not present

## 2024-10-12 DIAGNOSIS — D500 Iron deficiency anemia secondary to blood loss (chronic): Secondary | ICD-10-CM | POA: Diagnosis not present

## 2024-10-12 DIAGNOSIS — Z931 Gastrostomy status: Secondary | ICD-10-CM | POA: Diagnosis not present

## 2024-10-12 DIAGNOSIS — F418 Other specified anxiety disorders: Secondary | ICD-10-CM | POA: Diagnosis not present

## 2024-10-12 DIAGNOSIS — K5909 Other constipation: Secondary | ICD-10-CM | POA: Diagnosis not present

## 2024-10-12 DIAGNOSIS — I951 Orthostatic hypotension: Secondary | ICD-10-CM | POA: Diagnosis not present

## 2024-10-12 LAB — GLUCOSE, CAPILLARY
Glucose-Capillary: 85 mg/dL (ref 70–99)
Glucose-Capillary: 102 mg/dL — ABNORMAL HIGH (ref 70–99)
Glucose-Capillary: 68 mg/dL — ABNORMAL LOW (ref 70–99)
Glucose-Capillary: 99 mg/dL (ref 70–99)
Glucose-Capillary: 100 mg/dL — ABNORMAL HIGH (ref 70–99)
Glucose-Capillary: 95 mg/dL (ref 70–99)

## 2024-10-12 LAB — BASIC METABOLIC PANEL WITH GFR
Anion gap: 7 (ref 5–15)
BUN: 6 mg/dL (ref 6–20)
CO2: 24 mmol/L (ref 22–32)
Calcium: 8.8 mg/dL — ABNORMAL LOW (ref 8.9–10.3)
Chloride: 109 mmol/L (ref 98–111)
Creatinine, Ser: 0.63 mg/dL (ref 0.44–1.00)
GFR, Estimated: >60 mL/min
Glucose, Bld: 99 mg/dL (ref 70–99)
Potassium: 4.4 mmol/L (ref 3.5–5.1)
Sodium: 140 mmol/L (ref 135–145)

## 2024-10-12 LAB — CBC
HCT: 35.6 % — ABNORMAL LOW (ref 36.0–46.0)
Hemoglobin: 11.5 g/dL — ABNORMAL LOW (ref 12.0–15.0)
MCH: 30.7 pg (ref 26.0–34.0)
MCHC: 32.3 g/dL (ref 30.0–36.0)
MCV: 94.9 fL (ref 80.0–100.0)
Platelets: 405 K/uL — ABNORMAL HIGH (ref 150–400)
RBC: 3.75 MIL/uL — ABNORMAL LOW (ref 3.87–5.11)
RDW: 15.9 % — ABNORMAL HIGH (ref 11.5–15.5)
WBC: 5.9 K/uL (ref 4.0–10.5)
nRBC: 0 % (ref 0.0–0.2)

## 2024-10-12 LAB — TYPE AND SCREEN
ABO/RH(D): O POS
Antibody Screen: NEGATIVE
Unit division: 0
Unit division: 0

## 2024-10-12 LAB — BPAM RBC
Blood Product Expiration Date: 202602142359
Blood Product Expiration Date: 202602142359
ISSUE DATE / TIME: 202601141620
ISSUE DATE / TIME: 202601142017
Unit Type and Rh: 5100
Unit Type and Rh: 5100

## 2024-10-12 MED ORDER — MIDODRINE HCL 5 MG PO TABS
15.0000 mg | ORAL_TABLET | Freq: Three times a day (TID) | ORAL | Status: DC
  Administered 2024-10-12 – 2024-10-15 (×10): 15 mg via ORAL
  Filled 2024-10-12 (×11): qty 3

## 2024-10-12 NOTE — Plan of Care (Incomplete)
" °  Problem: Health Behavior/Discharge Planning: Goal: Ability to manage health-related needs will improve Outcome: Progressing   Problem: Clinical Measurements: Goal: Ability to maintain clinical measurements within normal limits will improve Outcome: Progressing Goal: Diagnostic test results will improve Outcome: Progressing   Problem: Education: Goal: Knowledge of General Education information will improve Description: Including pain rating scale, medication(s)/side effects and non-pharmacologic comfort measures Outcome: Adequate for Discharge   Problem: Clinical Measurements: Goal: Will remain free from infection Outcome: Adequate for Discharge Goal: Respiratory complications will improve Outcome: Adequate for Discharge Goal: Cardiovascular complication will be avoided Outcome: Adequate for Discharge   Problem: Activity: Goal: Risk for activity intolerance will decrease Outcome: Adequate for Discharge   "

## 2024-10-12 NOTE — Progress Notes (Signed)
 Mobility Specialist - Progress Note   10/12/24 1000  Mobility  Activity Ambulated with assistance  Level of Assistance Contact guard assist, steadying assist  Assistive Device Front wheel walker  Distance Ambulated (ft) 10 ft  Range of Motion/Exercises Active  Activity Response Tolerated fair  Mobility visit 1 Mobility  Mobility Specialist Start Time (ACUTE ONLY) 1000  Mobility Specialist Stop Time (ACUTE ONLY) 1010  Mobility Specialist Time Calculation (min) (ACUTE ONLY) 10 min   Pt was found trying to get up from bed. Pt requested assistance to bathroom and was left in bathroom with RN in room. Deferred hallway ambulation due to positive orthostatic vs.   Erminio Leos,  Mobility Specialist Can be reached via Secure Chat

## 2024-10-12 NOTE — Care Management Important Message (Signed)
 Important Message  Patient Details IM Letter given. Name: CYBILL URIEGAS MRN: [id number] Date of Birth: 18-Jul-1976   Important Message Given:  Yes - Medicare IM     [name] 10/12/2024, 10:05 AM

## 2024-10-12 NOTE — Progress Notes (Signed)
 " PROGRESS NOTE    [name]  FMW:[id number] DOB: Oct 20, 1975 DOA: 10/02/2024 PCP: Orion Kerns, MD    Chief Complaint  Patient presents with   Hematemesis    Brief Narrative:  [id number] with h/o remote complicated bariatric surgery resulting in recurrent UGI bleeding secondary to ulcers in the area of her GJ tube, chronic abdominal pain on opiates, chronic blood loss anemia requiring IV iron  infusions presented with coffee-ground emesis.  CT of the abdomen showed thickened small bowel loops along the region distal to the gastric surgical anastomosis consistent with enteritis.  She was started on IV PPI.  GI and general surgery were consulted.  She underwent EGD on 10/03/2024. She had issues with leaking from G-tube which has been fixed.  She was supposed to be discharged home on 10/09/2024 but discharge was held because of dizziness and patient feeling not ready to be discharged.    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 - Hemoglobin noted on admission 7.0 on 10/07/2024. - Status post transfusion 3 units PRBCs during this hospitalization with hemoglobin currently at 11.5 this morning.  - Patient still with complaints of lightheadedness and dizziness when working with PT and noted to be orthostatic per PT. -Repeat  orthostatics this morning positive. - Patient denies any overt bleeding. - Patient underwent CT abdomen and pelvis that showed moderate to markedly thickened and poorly distended small bowel loops along the region distal to the gastric surgical anastomosis, which may represent sequelae associated with enteritis.  Hepatic steatosis.  Evidence of prior cholecystectomy and hysterectomy.  Percutaneous gastrostomy tube in place. - Patient underwent EGD 10/03/2024 that showed acute gastrojejunal anastomosis with ulceration and visible sutures; partial to jejunum limb with ulceration and jejunojejunal anastomosis also with ulceration; 3 nonbleeding cratered gastric ulcers with pigmented material at anastomosis. - EGD reported chronic marginal ulcer that will need surgical evaluation for revision of anastomosis. - GI signed off and recommended full liquid diet. - General Surgery was following and no need for any surgical intervention but per general surgery with GI bleeding recurs patient might need to be transferred back to [facility]. - Continue metoprolol  and mesalamine . - Outpatient follow-up.  2.  Dizziness/orthostasis -It is noted that discharge was held 10/09/2024 due to dizziness and patient felt not ready for discharge. - Patient still with ongoing dizziness. - Patient noted to prior physician that she is on scheduled meclizine  at home. - Patient assessed by PT noted to be orthostatic. - Blood transfusion 3 days PRBCs during this hospitalization.  -Patient still orthostatic and has had show increase midodrine  to 15 mg 3 times daily.   - TED hose.   3.  Severe protein calorie malnutrition -Patient noted to have issues with leaking G-tube. - General Surgery was following but have signed off. - IR exchanged Foley catheter and G-tube on 10/06/2024. - Patient tolerating tube feeds. -  Continue current tube feeds and will transition back to home regimen on discharge..  4.  Folate deficiency anemia secondary  to malabsorption/iron  deficiency anemia -Continue iron  supplementation. -Outpatient follow-up.  5.  Hyperlipidemia - Continue statin.   6.  Depression/anxiety -Continue amitriptyline , bupropion , buspirone , duloxetine , Klonopin  as needed.  7.  Chronic pain with opiate dependence -Continue current home pain regimen. -Outpatient follow-up.  8.  Chronic constipation -Continue Linzess . -Continue Senokot, lactulose . -Patient noted not to have been able to bring her own Symproic  from home.  9.  Orthostatic hypotension - Patient with complaints of dizziness when orthostatics were checked this morning.   - Patient noted to be orthostatic with blood pressure of 134/89 with a pulse of 85 in the supine position, sitting up 127/87 with a pulse of 86, standing up 65/46 with a pulse of 119.   - Increase midodrine  to 15 mg 3 times daily.   - Status post transfusion 2 units PRBCs 10/11/2024.   - TED hose.   - Repeat orthostatics in the AM.    DVT prophylaxis: SCDs Code Status: Full Family Communication: Updated patient.  No family at bedside. Disposition: Home when clinically improved  Status is: Inpatient Remains inpatient appropriate because: Severity of illness   Consultants:  Gastroenterology: Dr. Dianna 10/03/2024  Procedures:  Upper endoscopy 10/03/2024-Dr. Dianna CT abdomen and pelvis 10/02/2024 IR replaced G-tube 10/06/2024 per Darrell Allred, PA Transfused 1 unit PRBCs 10/07/2024 Transfused 2 units PRBCs 10/11/2024  Antimicrobials:  Anti-infectives (From admission, onward)    None         Subjective: Patient awake, laying in bed.  RN flushing PEG tube.  Patient denies any chest pain or shortness of breath.  Does endorse significant dizziness when orthostatics were checked this morning.   Objective: Vitals:   10/11/24 2338 10/11/24 2344 10/12/24 0458 10/12/24 0512  BP: 94/65 (!) 103/52  118/76  Pulse: 92 85  85  Resp: 18   18  Temp: 98.2 F (36.8 C)   (!) 97.5 F  (36.4 C)  TempSrc: Oral   Oral  SpO2: 100%   100%  Weight:   59.1 kg   Height:        Intake/Output Summary (Last 24 hours) at 10/12/2024 1013 Last data filed at 10/12/2024 0909 Gross per 24 hour  Intake 2201.25 ml  Output --  Net 2201.25 ml   Filed Weights   10/11/24 0500 10/11/24 0500 10/12/24 0458  Weight: 61.2 kg 57.1 kg 59.1 kg    Examination:  General exam: NAD Respiratory system: CTAB anterior lung fields.  No wheezes, no crackles, no rhonchi.  Fair air movement.  Speaking in full sentences.   Cardiovascular system: Regular rate and rhythm no murmurs rubs or gallops.  No JVD.  No lower extremity edema.  Gastrointestinal system: Abdomen is soft, nontender, nondistended, positive bowel sounds.  PEG tube in place.  Central nervous system: Alert and oriented. No focal neurological deficits. Extremities: Symmetric 5 x 5 power. Skin: No rashes, lesions or ulcers Psychiatry: Judgement and insight appear normal. Mood & affect appropriate.     Data Reviewed: I have personally reviewed following labs and imaging studies  CBC: Recent Labs  Lab 10/07/24 0534 10/07/24 1916 10/08/24 0521 10/09/24 0456 10/10/24 0517 10/11/24 0539 10/12/24 0556  WBC 5.3  --  9.4 12.5* 6.4 8.6 5.9  NEUTROABS 2.9  --  6.8 9.6* 3.4 5.7  --   HGB 7.0*   < > 8.7* 8.6* 8.1* 8.0* 11.5*  HCT 23.2*   < >  27.8* 27.3* 26.7* 26.6* 35.6*  MCV 96.7  --  94.6 94.1 97.4 96.7 94.9  PLT 295  --  339 342 354 376 405*   < > = values in this interval not displayed.    Basic Metabolic Panel: Recent Labs  Lab 10/06/24 0547 10/07/24 0534 10/08/24 0521 10/09/24 0456 10/10/24 0517 10/11/24 0539 10/12/24 0556  NA 139 138 139 137 139 139 140  K 4.3 4.2 4.6 4.4 4.4 4.4 4.4  CL 104 104 103 103 106 105 109  CO2 26 30 29 27 30 27 24   GLUCOSE 140* 105* 92 115* 87 74 99  BUN 8 9 7 7  5* 6 6  CREATININE 0.64 0.63 0.63 0.58 0.59 0.54 0.63  CALCIUM  8.7* 8.5* 8.5* 8.7* 8.6* 8.6* 8.8*  MG 2.3 2.4 2.5* 2.3 2.5*  2.4  --   PHOS 4.2 4.2  --   --   --   --   --     GFR: Estimated Creatinine Clearance: 82 mL/min (by C-G formula based on SCr of 0.63 mg/dL).  Liver Function Tests: No results for input(s): AST, ALT, ALKPHOS, BILITOT, PROT, ALBUMIN  in the last 168 hours.  CBG: Recent Labs  Lab 10/11/24 1649 10/11/24 2020 10/11/24 2347 10/12/24 0459 10/12/24 0738  GLUCAP 88 92 130* 85 102*     No results found for this or any previous visit (from the past 240 hours).       Radiology Studies: No results found.      Scheduled Meds:  amitriptyline   50 mg Oral QHS   atorvastatin   40 mg Oral Daily   baclofen   10 mg Oral TID   buPROPion   450 mg Oral Daily   busPIRone   15 mg Oral TID   DULoxetine   60 mg Oral BID   feeding supplement (OSMOLITE 1.5 CAL)  1,000 mL Per Tube Q24H   ferrous sulfate   325 mg Oral Q breakfast   gabapentin   900 mg Oral TID   HYDROmorphone   4 mg Oral Q6H   lactulose   30 g Per Tube TID   linaclotide   290 mcg Oral QAC breakfast   mesalamine   2.4 g Oral BID   midodrine   15 mg Oral TID WC   misoprostol   200 mcg Oral Daily   multivitamin  15 mL Oral Daily   nystatin   5 mL Oral QID   oxybutynin   10 mg Oral QHS   pantoprazole  (PROTONIX ) IV  40 mg Intravenous Q12H   Ensure Max Protein  11 oz Oral BID   senna-docusate  1 tablet Oral BID   sodium chloride  flush  3 mL Intravenous Q12H   sucralfate   1 g Oral TID WC & HS   Continuous Infusions:   LOS: 10 days    Time spent: 40 minutes    Toribio Hummer, MD [facility]   To contact the attending provider between 7A-7P or the covering provider during after hours 7P-7A, please log into the web site [email or web address] and access using universal [place] password for that web site. If you do not have the password, please call the hospital operator.  10/12/2024, 10:13 AM    "

## 2024-10-13 DIAGNOSIS — K5909 Other constipation: Secondary | ICD-10-CM | POA: Diagnosis not present

## 2024-10-13 DIAGNOSIS — E43 Unspecified severe protein-calorie malnutrition: Secondary | ICD-10-CM

## 2024-10-13 DIAGNOSIS — D62 Acute posthemorrhagic anemia: Secondary | ICD-10-CM | POA: Diagnosis not present

## 2024-10-13 DIAGNOSIS — I951 Orthostatic hypotension: Secondary | ICD-10-CM | POA: Diagnosis not present

## 2024-10-13 DIAGNOSIS — G894 Chronic pain syndrome: Secondary | ICD-10-CM | POA: Diagnosis not present

## 2024-10-13 DIAGNOSIS — D529 Folate deficiency anemia, unspecified: Secondary | ICD-10-CM | POA: Diagnosis not present

## 2024-10-13 DIAGNOSIS — K285 Chronic or unspecified gastrojejunal ulcer with perforation: Secondary | ICD-10-CM | POA: Diagnosis not present

## 2024-10-13 DIAGNOSIS — F418 Other specified anxiety disorders: Secondary | ICD-10-CM | POA: Diagnosis not present

## 2024-10-13 DIAGNOSIS — Z931 Gastrostomy status: Secondary | ICD-10-CM | POA: Diagnosis not present

## 2024-10-13 DIAGNOSIS — E785 Hyperlipidemia, unspecified: Secondary | ICD-10-CM | POA: Diagnosis not present

## 2024-10-13 LAB — CBC
HCT: 41.5 % (ref 36.0–46.0)
Hemoglobin: 13 g/dL (ref 12.0–15.0)
MCH: 30 pg (ref 26.0–34.0)
MCHC: 31.3 g/dL (ref 30.0–36.0)
MCV: 95.6 fL (ref 80.0–100.0)
Platelets: 428 K/uL — ABNORMAL HIGH (ref 150–400)
RBC: 4.34 MIL/uL (ref 3.87–5.11)
RDW: 15.5 % (ref 11.5–15.5)
WBC: 10.4 K/uL (ref 4.0–10.5)
nRBC: 0 % (ref 0.0–0.2)

## 2024-10-13 LAB — GLUCOSE, CAPILLARY
Glucose-Capillary: 127 mg/dL — ABNORMAL HIGH (ref 70–99)
Glucose-Capillary: 128 mg/dL — ABNORMAL HIGH (ref 70–99)
Glucose-Capillary: 70 mg/dL (ref 70–99)
Glucose-Capillary: 75 mg/dL (ref 70–99)
Glucose-Capillary: 88 mg/dL (ref 70–99)
Glucose-Capillary: 99 mg/dL (ref 70–99)

## 2024-10-13 MED ORDER — LACTULOSE 10 GM/15ML PO SOLN
30.0000 g | Freq: Three times a day (TID) | ORAL | Status: DC
  Administered 2024-10-13 – 2024-10-14 (×3): 30 g via ORAL
  Filled 2024-10-13 (×5): qty 45

## 2024-10-13 MED ORDER — SODIUM CHLORIDE 1 G PO TABS
1.0000 g | ORAL_TABLET | Freq: Two times a day (BID) | ORAL | Status: DC
  Administered 2024-10-13 – 2024-10-14 (×2): 1 g via ORAL
  Filled 2024-10-13 (×2): qty 1

## 2024-10-13 NOTE — Progress Notes (Signed)
 Physical Therapy Treatment Patient Details Name: [name] MRN: [id number] DOB: 1976-08-06 Today's Date: 10/13/2024   History of Present Illness Pt is a 49 y.o. female who presented with coffee-ground emesis, upper GI bleed. CT of the abdomen showed thickened small bowel loops along the region distal to the gastric surgical anastomosis consistent with enteritis. Pt with h/o remote complicated bariatric surgery resulting in recurrent UGI bleeding secondary to ulcers in the area of her GJ tube. chronic abdominal pain on opiates, chronic blood loss anemia requiring IV iron  infusions. She underwent EGD on 10/03/2024.    PT Comments   Pt admitted with above diagnosis.  Pt currently with functional limitations due to the deficits listed below (see PT Problem List). Pt semi reclined in bed when PT arrived and agreeable to therapy session. Pt reported difficulty clearing her throat when drinking the ensure. Pt is mod I for supine <> sit, S for sit to stand  from EOB to RW, pt reported dizziness with standing (please see vital sign flowsheet) pt able to maintain standing ~ 6 mins and reach outside BOS with S and no overt LOB, PT deferred ambulation due to ongoing reports of dizziness even with prolonged standing. Pt left in bed and all needs in place. Pt Pt will benefit from acute skilled PT to increase their independence and safety with mobility to allow discharge.      If plan is discharge home, recommend the following: Assistance with cooking/housework;Assist for transportation;Help with stairs or ramp for entrance;A little help with walking and/or transfers   Can travel by private vehicle        Equipment Recommendations  None recommended by PT    Recommendations for Other Services       Precautions / Restrictions Precautions Precautions: Fall Recall of Precautions/Restrictions: Intact Precaution/Restrictions Comments: orthostatic, fell in hosptial Restrictions Weight Bearing  Restrictions Per Provider Order: No     Mobility  Bed Mobility Overal bed mobility: Modified Independent             General bed mobility comments: mod I with use of hospital bed for supine <> sit    Transfers Overall transfer level: Needs assistance Equipment used: Rolling walker (2 wheels) Transfers: Sit to/from Stand Sit to Stand: Supervision           General transfer comment: min cues for safety, pt reported feeling dizzy with initial standing and Bp limiting progression with gait tasks today    Ambulation/Gait               General Gait Details: NT hypotensive   Stairs             Wheelchair Mobility     Tilt Bed    Modified Rankin (Stroke Patients Only)       Balance Overall balance assessment: Modified Independent                                          Communication Communication Communication: No apparent difficulties  Cognition Arousal: Alert Behavior During Therapy: WFL for tasks assessed/performed   PT - Cognitive impairments: No apparent impairments                         Following commands: Intact      Cueing    Exercises      General Comments  Pertinent Vitals/Pain Pain Assessment Pain Assessment: Faces Faces Pain Scale: Hurts little more Pain Location: B LEs, back Pain Descriptors / Indicators: Sore, Discomfort Pain Intervention(s): Limited activity within patient's tolerance, Monitored during session, Repositioned    Home Living                          Prior Function            PT Goals (current goals can now be found in the care plan section) Acute Rehab PT Goals Patient Stated Goal: get stronger PT Goal Formulation: With patient Time For Goal Achievement: 10/19/24 Potential to Achieve Goals: Good Progress towards PT goals: Progressing toward goals (limited due to symptomatic orthostatic hypotension)    Frequency    Min 3X/week      PT Plan       Co-evaluation              AM-PAC PT 6 Clicks Mobility   Outcome Measure  Help needed turning from your back to your side while in a flat bed without using bedrails?: None Help needed moving from lying on your back to sitting on the side of a flat bed without using bedrails?: None Help needed moving to and from a bed to a chair (including a wheelchair)?: None Help needed standing up from a chair using your arms (e.g., wheelchair or bedside chair)?: None Help needed to walk in hospital room?: A Little Help needed climbing 3-5 steps with a railing? : A Little 6 Click Score: 22    End of Session   Activity Tolerance: Other (comment) (dizziness and Bp) Patient left: in bed;with call bell/phone within reach Nurse Communication: Mobility status;Other (comment) (orthostatic vitals, difficulty clearing throat and wanting to visit mother on 5th floor) PT Visit Diagnosis: Unsteadiness on feet (R26.81);Muscle weakness (generalized) (M62.81);Other abnormalities of gait and mobility (R26.89)     Time: 8494-8473 PT Time Calculation (min) (ACUTE ONLY): 21 min  Charges:    $Therapeutic Activity: 8-22 mins PT General Charges $$ ACUTE PT VISIT: 1 Visit                     [place], PT Acute Rehab    [place] VEAR Drone 10/13/2024, 3:54 PM

## 2024-10-13 NOTE — Progress Notes (Signed)
 " PROGRESS NOTE    [name]  FMW:[id number] DOB: [birth year] DOA: 10/02/2024 PCP: Orion Kerns, MD    Chief Complaint  Patient presents with   Hematemesis    Brief Narrative:  [phone] with h/o remote complicated bariatric surgery resulting in recurrent UGI bleeding secondary to ulcers in the area of her GJ tube, chronic abdominal pain on opiates, chronic blood loss anemia requiring IV iron  infusions presented with coffee-ground emesis.  CT of the abdomen showed thickened small bowel loops along the region distal to the gastric surgical anastomosis consistent with enteritis.  She was started on IV PPI.  GI and general surgery were consulted.  She underwent EGD on 10/03/2024. She had issues with leaking from G-tube which has been fixed.  She was supposed to be discharged home on 10/09/2024 but discharge was held because of dizziness and patient feeling not ready to be discharged.    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 - Hemoglobin noted on admission 7.0 on 10/07/2024. - Status post transfusion 3 units PRBCs during this hospitalization with hemoglobin currently at 11.5 this morning.  - Patient still with complaints of lightheadedness and dizziness when working with PT and noted to be orthostatic per PT. -Repeat  orthostatics on 10/12/2024 noted to be positive.. - Patient denies any overt bleeding. - Patient underwent CT abdomen and pelvis that showed moderate to markedly thickened and poorly distended small bowel loops along the region distal to the gastric surgical anastomosis, which may represent sequelae associated with enteritis.  Hepatic steatosis.  Evidence of prior cholecystectomy and hysterectomy.  Percutaneous gastrostomy tube in place. - Patient underwent EGD 10/03/2024 that showed acute gastrojejunal anastomosis with ulceration and visible sutures; partial to jejunum limb with ulceration and jejunojejunal anastomosis also with ulceration; 3 nonbleeding cratered gastric ulcers with pigmented material at anastomosis. - EGD reported chronic marginal ulcer that will need surgical evaluation for revision of anastomosis. - GI signed off and recommended full liquid diet only. - General Surgery was following and no need for any surgical intervention but per general surgery with GI bleeding recurs patient might need to be transferred back to [facility]. - Continue metoprolol  and mesalamine . - Outpatient follow-up.  2.  Dizziness/orthostasis -It is noted that discharge was held 10/09/2024 due to dizziness and patient felt not ready for discharge. - Patient still with ongoing dizziness. - Patient noted to prior physician that she is on scheduled meclizine  at home. - Patient assessed by PT noted to be orthostatic. - Blood transfusion 3 days PRBCs during this hospitalization.   -Patient still orthostatic and midodrine  increased to 15 mg 3 times daily.   - Some clinical improvement.   - Continue TED hose.   - Place on salt tablets twice daily.   3.  Severe protein calorie malnutrition -Patient noted to have issues with leaking G-tube. - General Surgery was following  but have signed off. - IR exchanged Foley catheter and G-tube on 10/06/2024. - Patient tolerating tube feeds. - Continue current tube feeds and will  transition back to home regimen on discharge..  4.  Folate deficiency anemia secondary to malabsorption/iron  deficiency anemia -Continue iron  supplementation. -Outpatient follow-up.  5.  Hyperlipidemia - Statin.  6.  Depression/anxiety -Continue amitriptyline , bupropion , buspirone , duloxetine , Klonopin  as needed.  7.  Chronic pain with opiate dependence -Continue current home pain regimen. -Outpatient follow-up.  8.  Chronic constipation -Continue Linzess . -Continue Senokot, lactulose . -Patient noted not to have been able to bring her own Symproic  from home.  9.  Orthostatic hypotension - Patient with complaints of dizziness when orthostatics were checked the morning of 10/12/2024.   - Patient noted to be orthostatic with blood pressure of 134/89 with a pulse of 85 in the supine position, sitting up 127/87 with a pulse of 86, standing up 65/46 with a pulse of 119.   - Increased midodrine  to 15 mg 3 times daily.   - Status post transfusion 2 units PRBCs 10/11/2024.   - TED hose.   -Some improvement with orthostasis however patient somewhat symptomatic. -Add salt tablets twice daily. - Repeat orthostatics in the AM.    DVT prophylaxis: SCDs Code Status: Full Family Communication: Updated patient.  No family at bedside. Disposition: Home when clinically improved  Status is: Inpatient Remains inpatient appropriate because: Severity of illness   Consultants:  Gastroenterology: Dr. Dianna 10/03/2024  Procedures:  Upper endoscopy 10/03/2024-Dr. Dianna CT abdomen and pelvis 10/02/2024 IR replaced G-tube 10/06/2024 per Darrell Allred, PA Transfused 1 unit PRBCs 10/07/2024 Transfused 2 units PRBCs 10/11/2024  Antimicrobials:  Anti-infectives (From admission, onward)    None         Subjective: Sitting up in bed getting ready to eat her full liquid lunch.  Overall feeling better than she did over the past few days per patient.  Lightheadedness and dizziness is slowly  improving.  Denies any chest pain or shortness of breath.  No significant abdominal pain.  No melanotic stools.   Objective: Vitals:   10/12/24 2009 10/13/24 0428 10/13/24 0500 10/13/24 1141  BP: 96/63 106/67  111/74  Pulse: 77 82  72  Resp: 15 15  12   Temp: 98.5 F (36.9 C) 98.7 F (37.1 C)  98.8 F (37.1 C)  TempSrc: Oral Oral  Oral  SpO2: 98% 99%  100%  Weight:   58 kg   Height:        Intake/Output Summary (Last 24 hours) at 10/13/2024 1327 Last data filed at 10/12/2024 1700 Gross per 24 hour  Intake 480 ml  Output --  Net 480 ml   Filed Weights   10/11/24 0500 10/12/24 0458 10/13/24 0500  Weight: 57.1 kg 59.1 kg 58 kg    Examination:  General exam: NAD Respiratory system: Lungs clear to auscultation bilaterally anterior lung fields.  No wheezes, no crackles, no rhonchi.  Fair air movement.  Speaking in full sentences.  Cardiovascular system: RRR no murmurs rubs or gallops.  No JVD.  No pitting lower extremity edema. Gastrointestinal system: Abdomen is soft, nontender, nondistended, positive bowel sounds.  PEG tube in place.  Central nervous system: Alert and oriented. No focal neurological deficits. Extremities: Symmetric 5 x 5 power. Skin: No rashes, lesions or ulcers Psychiatry: Judgement and insight appear normal. Mood & affect appropriate.     Data Reviewed: I have personally reviewed following labs and imaging studies  CBC: Recent Labs  Lab 10/07/24  [id number] 10/07/24 1916 10/08/24 0521 10/09/24 0456 10/10/24 0517 10/11/24 0539 10/12/24 0556 10/13/24 0552  WBC 5.3  --  9.4 12.5* 6.4 8.6 5.9 10.4  NEUTROABS 2.9  --  6.8 9.6* 3.4 5.7  --   --   HGB 7.0*   < > 8.7* 8.6* 8.1* 8.0* 11.5* 13.0  HCT 23.2*   < > 27.8* 27.3* 26.7* 26.6* 35.6* 41.5  MCV 96.7  --  94.6 94.1 97.4 96.7 94.9 95.6  PLT 295  --  339 342 354 376 405* 428*   < > = values in this interval not displayed.    Basic Metabolic Panel: Recent Labs  Lab 10/07/24 0534 10/08/24 0521  10/09/24 0456 10/10/24 0517 10/11/24 0539 10/12/24 0556  NA 138 139 137 139 139 140  K 4.2 4.6 4.4 4.4 4.4 4.4  CL 104 103 103 106 105 109  CO2 30 29 27 30 27 24   GLUCOSE 105* 92 115* 87 74 99  BUN 9 7 7  5* 6 6  CREATININE 0.63 0.63 0.58 0.59 0.54 0.63  CALCIUM  8.5* 8.5* 8.7* 8.6* 8.6* 8.8*  MG 2.4 2.5* 2.3 2.5* 2.4  --   PHOS 4.2  --   --   --   --   --     GFR: Estimated Creatinine Clearance: 80.5 mL/min (by C-G formula based on SCr of 0.63 mg/dL).  Liver Function Tests: No results for input(s): AST, ALT, ALKPHOS, BILITOT, PROT, ALBUMIN  in the last 168 hours.  CBG: Recent Labs  Lab 10/12/24 2007 10/12/24 2309 10/13/24 0425 10/13/24 0740 10/13/24 1139  GLUCAP 100* 95 88 127* 128*     No results found for this or any previous visit (from the past 240 hours).       Radiology Studies: No results found.      Scheduled Meds:  amitriptyline   50 mg Oral QHS   atorvastatin   40 mg Oral Daily   baclofen   10 mg Oral TID   buPROPion   450 mg Oral Daily   busPIRone   15 mg Oral TID   DULoxetine   60 mg Oral BID   feeding supplement (OSMOLITE 1.5 CAL)  1,000 mL Per Tube Q24H   ferrous sulfate   325 mg Oral Q breakfast   gabapentin   900 mg Oral TID   HYDROmorphone   4 mg Oral Q6H   lactulose   30 g Per Tube TID   linaclotide   290 mcg Oral QAC breakfast   mesalamine   2.4 g Oral BID   midodrine   15 mg Oral TID WC   misoprostol   200 mcg Oral Daily   multivitamin  15 mL Oral Daily   nystatin   5 mL Oral QID   oxybutynin   10 mg Oral QHS   pantoprazole  (PROTONIX ) IV  40 mg Intravenous Q12H   Ensure Max Protein  11 oz Oral BID   senna-docusate  1 tablet Oral BID   sodium chloride  flush  3 mL Intravenous Q12H   sucralfate   1 g Oral TID WC & HS   Continuous Infusions:   LOS: 11 days    Time spent: 35 minutes    Toribio Hummer, MD [facility]   To contact the attending provider between 7A-7P or the covering provider during after hours 7P-7A,  please log into the web site [email or web address] and access using universal [place] password for that web site. If you do not have the password, please call the hospital operator.  10/13/2024, 1:27 PM    "

## 2024-10-14 DIAGNOSIS — K5909 Other constipation: Secondary | ICD-10-CM | POA: Diagnosis not present

## 2024-10-14 DIAGNOSIS — D62 Acute posthemorrhagic anemia: Secondary | ICD-10-CM | POA: Diagnosis not present

## 2024-10-14 DIAGNOSIS — F418 Other specified anxiety disorders: Secondary | ICD-10-CM | POA: Diagnosis not present

## 2024-10-14 DIAGNOSIS — K285 Chronic or unspecified gastrojejunal ulcer with perforation: Secondary | ICD-10-CM | POA: Diagnosis not present

## 2024-10-14 DIAGNOSIS — I951 Orthostatic hypotension: Secondary | ICD-10-CM | POA: Diagnosis not present

## 2024-10-14 DIAGNOSIS — D529 Folate deficiency anemia, unspecified: Secondary | ICD-10-CM | POA: Diagnosis not present

## 2024-10-14 DIAGNOSIS — E785 Hyperlipidemia, unspecified: Secondary | ICD-10-CM | POA: Diagnosis not present

## 2024-10-14 DIAGNOSIS — G894 Chronic pain syndrome: Secondary | ICD-10-CM | POA: Diagnosis not present

## 2024-10-14 DIAGNOSIS — E43 Unspecified severe protein-calorie malnutrition: Secondary | ICD-10-CM | POA: Diagnosis not present

## 2024-10-14 DIAGNOSIS — Z931 Gastrostomy status: Secondary | ICD-10-CM | POA: Diagnosis not present

## 2024-10-14 LAB — BASIC METABOLIC PANEL WITH GFR
Anion gap: 5 (ref 5–15)
BUN: 8 mg/dL (ref 6–20)
CO2: 29 mmol/L (ref 22–32)
Calcium: 8.9 mg/dL (ref 8.9–10.3)
Chloride: 107 mmol/L (ref 98–111)
Creatinine, Ser: 0.61 mg/dL (ref 0.44–1.00)
GFR, Estimated: >60 mL/min
Glucose, Bld: 80 mg/dL (ref 70–99)
Potassium: 4.5 mmol/L (ref 3.5–5.1)
Sodium: 142 mmol/L (ref 135–145)

## 2024-10-14 LAB — CBC
HCT: 35 % — ABNORMAL LOW (ref 36.0–46.0)
Hemoglobin: 11.1 g/dL — ABNORMAL LOW (ref 12.0–15.0)
MCH: 29.9 pg (ref 26.0–34.0)
MCHC: 31.7 g/dL (ref 30.0–36.0)
MCV: 94.3 fL (ref 80.0–100.0)
Platelets: 413 K/uL — ABNORMAL HIGH (ref 150–400)
RBC: 3.71 MIL/uL — ABNORMAL LOW (ref 3.87–5.11)
RDW: 15.3 % (ref 11.5–15.5)
WBC: 5.8 K/uL (ref 4.0–10.5)
nRBC: 0 % (ref 0.0–0.2)

## 2024-10-14 LAB — GLUCOSE, CAPILLARY
Glucose-Capillary: 105 mg/dL — ABNORMAL HIGH (ref 70–99)
Glucose-Capillary: 79 mg/dL (ref 70–99)
Glucose-Capillary: 92 mg/dL (ref 70–99)
Glucose-Capillary: 94 mg/dL (ref 70–99)
Glucose-Capillary: 99 mg/dL (ref 70–99)
Glucose-Capillary: 99 mg/dL (ref 70–99)

## 2024-10-14 MED ORDER — THIAMINE MONONITRATE 100 MG PO TABS
100.0000 mg | ORAL_TABLET | Freq: Every day | ORAL | Status: DC
  Administered 2024-10-14 – 2024-10-15 (×2): 100 mg via ORAL
  Filled 2024-10-14 (×2): qty 1

## 2024-10-14 MED ORDER — ALBUMIN HUMAN 25 % IV SOLN
25.0000 g | Freq: Once | INTRAVENOUS | Status: AC
  Administered 2024-10-14: 25 g via INTRAVENOUS
  Filled 2024-10-14: qty 100

## 2024-10-14 MED ORDER — VITAMIN D (ERGOCALCIFEROL) 1.25 MG (50000 UNIT) PO CAPS: 50000.0000 [IU] | ORAL_CAPSULE | ORAL | Status: DC

## 2024-10-14 MED ORDER — HYDROMORPHONE HCL 2 MG PO TABS
2.0000 mg | ORAL_TABLET | Freq: Four times a day (QID) | ORAL | Status: DC
  Administered 2024-10-14 – 2024-10-15 (×4): 2 mg via ORAL
  Filled 2024-10-14 (×5): qty 1

## 2024-10-14 MED ORDER — VITAMIN D 25 MCG (1000 UNIT) PO TABS
1000.0000 [IU] | ORAL_TABLET | Freq: Every day | ORAL | Status: DC
  Administered 2024-10-14 – 2024-10-15 (×2): 1000 [IU] via ORAL
  Filled 2024-10-14 (×2): qty 1

## 2024-10-14 MED ORDER — PANTOPRAZOLE SODIUM 40 MG PO TBEC
40.0000 mg | DELAYED_RELEASE_TABLET | Freq: Two times a day (BID) | ORAL | Status: DC
  Administered 2024-10-14 – 2024-10-15 (×4): 40 mg via ORAL
  Filled 2024-10-14 (×4): qty 1

## 2024-10-14 MED ORDER — DICLOFENAC SODIUM 1 % EX GEL: 2.0000 g | Freq: Four times a day (QID) | CUTANEOUS | Status: DC | PRN

## 2024-10-14 NOTE — Progress Notes (Signed)
 " PROGRESS NOTE    [name]  FMW:[id number] DOB: 28-May-1976 DOA: 10/02/2024 PCP: Orion Kerns, MD    Chief Complaint  Patient presents with   Hematemesis    Brief Narrative:  [phone] with h/o remote complicated bariatric surgery resulting in recurrent UGI bleeding secondary to ulcers in the area of her GJ tube, chronic abdominal pain on opiates, chronic blood loss anemia requiring IV iron  infusions presented with coffee-ground emesis.  CT of the abdomen showed thickened small bowel loops along the region distal to the gastric surgical anastomosis consistent with enteritis.  She was started on IV PPI.  GI and general surgery were consulted.  She underwent EGD on 10/03/2024. She had issues with leaking from G-tube which has been fixed.  She was supposed to be discharged home on 10/09/2024 but discharge was held because of dizziness and patient feeling not ready to be discharged.    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 - Hemoglobin noted on admission 7.0 on 10/07/2024. - Status post transfusion 3 units PRBCs during this hospitalization with hemoglobin currently at 11.1 this morning.  - Patient still with complaints of lightheadedness and dizziness when working with PT and noted to be orthostatic per PT. -Repeat  orthostatics on 10/12/2024 noted to be positive.. - Patient denies any overt bleeding. - Patient underwent CT abdomen and pelvis that showed moderate to markedly thickened and poorly distended small bowel loops along the region distal to the gastric surgical anastomosis, which may represent sequelae associated with enteritis.  Hepatic steatosis.  Evidence of prior cholecystectomy and hysterectomy.  Percutaneous gastrostomy tube in place. - Patient underwent EGD 10/03/2024 that showed acute gastrojejunal anastomosis with ulceration and visible sutures; partial to jejunum limb with ulceration and jejunojejunal anastomosis also with ulceration; 3 nonbleeding cratered gastric ulcers with pigmented material at anastomosis. - EGD reported chronic marginal ulcer that will need surgical evaluation for revision of anastomosis. - GI signed off and recommended full liquid diet only. - General Surgery was following and no need for any surgical intervention but per general surgery with GI bleeding recurs patient might need to be transferred back to [facility]. - Continue metoprolol  and mesalamine . - Outpatient follow-up.  2.  Dizziness/orthostasis -It is noted that discharge was held 10/09/2024 due to dizziness and patient felt not ready for discharge. - Patient still with ongoing dizziness. - Patient noted to prior physician that she is on scheduled meclizine  at home. - Patient assessed by PT noted to be orthostatic. - Blood transfusion 3 days PRBCs during this hospitalization.   -Patient still orthostatic and midodrine  increased to 15 mg 3 times daily.   - Some clinical improvement however still orthostatic - IV albumin  x 1. - DC IV Dilaudid . - Increase home regimen oral Dilaudid  to 2 mg every 6 hours. - Discontinue salt tablets.  3.  Severe protein calorie malnutrition -Patient  noted to have issues with leaking G-tube. - General Surgery was following but have signed off. - IR exchanged Foley catheter and  G-tube on 10/06/2024. - Patient tolerating tube feeds. - Continue current tube feeds and will transition back to home regimen on discharge..  4.  Folate deficiency anemia secondary to malabsorption/iron  deficiency anemia -Continue iron  supplementation. -Outpatient follow-up.  5.  Hyperlipidemia - Continue statin.  6.  Depression/anxiety -Continue amitriptyline , bupropion , buspirone , duloxetine , Klonopin  as needed.  7.  Chronic pain with opiate dependence - Decrease home pain regimen to Dilaudid  2 mg every 6 hours as patient with orthostatic hypotension which may be contributing. -DC IV Dilaudid . -Outpatient follow-up.  8.  Chronic constipation -Continue Linzess . -Continue Senokot, lactulose . -Patient noted not to have been able to bring her own Symproic  from home.  9.  Orthostatic hypotension - Patient with complaints of dizziness when orthostatics were checked the morning of 10/12/2024.   - Patient noted to be orthostatic with blood pressure of 134/89 with a pulse of 85 in the supine position, sitting up 127/87 with a pulse of 86, standing up 65/46 with a pulse of 119.   - Continue midodrine  to 15 mg 3 times daily.   - Status post transfusion 2 units PRBCs 10/11/2024.   - TED hose.   -Some improvement with orthostasis however patient somewhat symptomatic. -Repeat orthostatics with a blood pressure of 114/83 and pulse of 92 sitting, standing at 0 minutes 79/57 with a pulse of 99, standing after 3 minutes with blood pressure of 83/70 with a pulse of 112. -DC salt tablets. -Change TED hose to thigh-high's. -Discontinue IV Dilaudid . -Decrease home dose oral Dilaudid  to 2 mg every 6 hours. -IV albumin  x 1. - Repeat orthostatics in the AM.    DVT prophylaxis: SCDs Code Status: Full Family Communication: Updated patient.  No family at bedside. Disposition: Home when clinically improved with improvement with orthostasis.  Status is: Inpatient Remains inpatient appropriate because:  Severity of illness   Consultants:  Gastroenterology: Dr. Dianna 10/03/2024  Procedures:  Upper endoscopy 10/03/2024-Dr. Dianna CT abdomen and pelvis 10/02/2024 IR replaced G-tube 10/06/2024 per Darrell Allred, PA Transfused 1 unit PRBCs 10/07/2024 Transfused 2 units PRBCs 10/11/2024  Antimicrobials:  Anti-infectives (From admission, onward)    None         Subjective: Patient sleeping but easily arousable.  Tolerating full liquids.  Patient still with lightheadedness and dizziness with orthostatics.  Denies any chest pain or abdominal pain.  Feels lightheadedness and dizziness improved over the past 24 hours.    Objective: Vitals:   10/14/24 0403 10/14/24 0456 10/14/24 0855 10/14/24 1052  BP: (!) 97/56  99/63 109/65  Pulse: 92  81 79  Resp: 15   15  Temp: 98.7 F (37.1 C)   98.1 F (36.7 C)  TempSrc: Oral   Oral  SpO2: 98%  100% 100%  Weight:  58.5 kg    Height:        Intake/Output Summary (Last 24 hours) at 10/14/2024 1347 Last data filed at 10/14/2024 9176 Gross per 24 hour  Intake 2115.75 ml  Output --  Net 2115.75 ml   Filed Weights   10/12/24 0458 10/13/24 0500 10/14/24 0456  Weight: 59.1 kg 58 kg 58.5 kg    Examination:  General exam: NAD Respiratory system: CTAB.  No wheezes, no crackles, no rhonchi.  Fair air movement.  Speaking in full sentences.   Cardiovascular system: Regular rate rhythm no murmurs rubs or gallops.  No JVD.  No lower extremity edema.  Gastrointestinal system: Abdomen is soft, nontender, nondistended, positive bowel sounds.  PEG tube in place.  Central nervous system: Alert and oriented. No focal neurological deficits. Extremities: Symmetric 5 x 5 power. Skin: No rashes, lesions or ulcers Psychiatry: Judgement and insight appear normal. Mood & affect appropriate.     Data Reviewed: I have personally reviewed following labs and imaging studies  CBC: Recent Labs  Lab 10/08/24 0521 10/09/24 0456 10/10/24 0517 10/11/24 0539  10/12/24 0556 10/13/24 0552 10/14/24 0522  WBC 9.4 12.5* 6.4 8.6 5.9 10.4 5.8  NEUTROABS 6.8 9.6* 3.4 5.7  --   --   --   HGB 8.7* 8.6* 8.1* 8.0* 11.5* 13.0 11.1*  HCT 27.8* 27.3* 26.7* 26.6* 35.6* 41.5 35.0*  MCV 94.6 94.1 97.4 96.7 94.9 95.6 94.3  PLT 339 342 354 376 405* 428* 413*    Basic Metabolic Panel: Recent Labs  Lab 10/08/24 0521 10/09/24 0456 10/10/24 0517 10/11/24 0539 10/12/24 0556 10/14/24 0522  NA 139 137 139 139 140 142  K 4.6 4.4 4.4 4.4 4.4 4.5  CL 103 103 106 105 109 107  CO2 29 27 30 27 24 29   GLUCOSE 92 115* 87 74 99 80  BUN 7 7 5* 6 6 8   CREATININE 0.63 0.58 0.59 0.54 0.63 0.61  CALCIUM  8.5* 8.7* 8.6* 8.6* 8.8* 8.9  MG 2.5* 2.3 2.5* 2.4  --   --     GFR: Estimated Creatinine Clearance: 81.1 mL/min (by C-G formula based on SCr of 0.61 mg/dL).  Liver Function Tests: No results for input(s): AST, ALT, ALKPHOS, BILITOT, PROT, ALBUMIN  in the last 168 hours.  CBG: Recent Labs  Lab 10/13/24 2030 10/13/24 2357 10/14/24 0401 10/14/24 0729 10/14/24 1147  GLUCAP 75 99 99 99 79     No results found for this or any previous visit (from the past 240 hours).       Radiology Studies: No results found.      Scheduled Meds:  amitriptyline   50 mg Oral QHS   atorvastatin   40 mg Oral Daily   baclofen   10 mg Oral TID   buPROPion   450 mg Oral Daily   busPIRone   15 mg Oral TID   cholecalciferol   1,000 Units Oral Daily   DULoxetine   60 mg Oral BID   feeding supplement (OSMOLITE 1.5 CAL)  1,000 mL Per Tube Q24H   ferrous sulfate   325 mg Oral Q breakfast   gabapentin   900 mg Oral TID   HYDROmorphone   2 mg Oral Q6H   lactulose   30 g Oral TID   linaclotide   290 mcg Oral QAC breakfast   mesalamine   2.4 g Oral BID   midodrine   15 mg Oral TID WC   misoprostol   200 mcg Oral Daily   multivitamin  15 mL Oral Daily   nystatin   5 mL Oral QID   oxybutynin   10 mg Oral QHS   pantoprazole   40 mg Oral BID AC   Ensure Max Protein  11 oz Oral  BID   senna-docusate  1 tablet Oral BID   sodium chloride  flush  3 mL Intravenous Q12H   sucralfate   1 g Oral TID WC & HS   thiamine   100 mg Oral Q1200   [START ON 10/16/2024] Vitamin D  (Ergocalciferol )  50,000 Units Oral Q Mon   Continuous Infusions:   LOS: 12 days    Time spent: 35 minutes    Toribio Hummer, MD [facility]  To contact the attending provider between 7A-7P or the covering provider during after hours 7P-7A, please log into the web site [email or web address] and access using universal [place] password for that web site. If you do not have the password, please call the hospital operator.  10/14/2024, 1:47 PM    "

## 2024-10-14 NOTE — Plan of Care (Signed)

## 2024-10-15 DIAGNOSIS — F418 Other specified anxiety disorders: Secondary | ICD-10-CM | POA: Diagnosis not present

## 2024-10-15 DIAGNOSIS — K285 Chronic or unspecified gastrojejunal ulcer with perforation: Secondary | ICD-10-CM | POA: Diagnosis not present

## 2024-10-15 DIAGNOSIS — I951 Orthostatic hypotension: Secondary | ICD-10-CM | POA: Diagnosis not present

## 2024-10-15 DIAGNOSIS — E440 Moderate protein-calorie malnutrition: Secondary | ICD-10-CM

## 2024-10-15 DIAGNOSIS — D529 Folate deficiency anemia, unspecified: Secondary | ICD-10-CM | POA: Diagnosis not present

## 2024-10-15 DIAGNOSIS — E785 Hyperlipidemia, unspecified: Secondary | ICD-10-CM | POA: Diagnosis not present

## 2024-10-15 DIAGNOSIS — K5909 Other constipation: Secondary | ICD-10-CM | POA: Diagnosis not present

## 2024-10-15 DIAGNOSIS — E43 Unspecified severe protein-calorie malnutrition: Secondary | ICD-10-CM | POA: Diagnosis not present

## 2024-10-15 DIAGNOSIS — G894 Chronic pain syndrome: Secondary | ICD-10-CM | POA: Diagnosis not present

## 2024-10-15 DIAGNOSIS — D62 Acute posthemorrhagic anemia: Secondary | ICD-10-CM | POA: Diagnosis not present

## 2024-10-15 DIAGNOSIS — Z931 Gastrostomy status: Secondary | ICD-10-CM | POA: Diagnosis not present

## 2024-10-15 LAB — GLUCOSE, CAPILLARY
Glucose-Capillary: 99 mg/dL (ref 70–99)
Glucose-Capillary: 102 mg/dL — ABNORMAL HIGH (ref 70–99)
Glucose-Capillary: 142 mg/dL — ABNORMAL HIGH (ref 70–99)
Glucose-Capillary: 85 mg/dL (ref 70–99)

## 2024-10-15 LAB — BASIC METABOLIC PANEL WITH GFR
Anion gap: 6 (ref 5–15)
BUN: 8 mg/dL (ref 6–20)
CO2: 27 mmol/L (ref 22–32)
Calcium: 9.1 mg/dL (ref 8.9–10.3)
Chloride: 105 mmol/L (ref 98–111)
Creatinine, Ser: 0.54 mg/dL (ref 0.44–1.00)
GFR, Estimated: >60 mL/min
Glucose, Bld: 88 mg/dL (ref 70–99)
Potassium: 4.4 mmol/L (ref 3.5–5.1)
Sodium: 138 mmol/L (ref 135–145)

## 2024-10-15 LAB — CBC
HCT: 35.6 % — ABNORMAL LOW (ref 36.0–46.0)
Hemoglobin: 11.7 g/dL — ABNORMAL LOW (ref 12.0–15.0)
MCH: 30.9 pg (ref 26.0–34.0)
MCHC: 32.9 g/dL (ref 30.0–36.0)
MCV: 93.9 fL (ref 80.0–100.0)
Platelets: 410 K/uL — ABNORMAL HIGH (ref 150–400)
RBC: 3.79 MIL/uL — ABNORMAL LOW (ref 3.87–5.11)
RDW: 15.3 % (ref 11.5–15.5)
WBC: 6.7 K/uL (ref 4.0–10.5)
nRBC: 0 % (ref 0.0–0.2)

## 2024-10-15 MED ORDER — MIDODRINE HCL 10 MG PO TABS: 15.0000 mg | ORAL_TABLET | Freq: Three times a day (TID) | ORAL | 1 refills | Status: AC

## 2024-10-15 MED ORDER — HYDROMORPHONE HCL 2 MG PO TABS
2.0000 mg | ORAL_TABLET | Freq: Once | ORAL | Status: AC
  Administered 2024-10-15: 2 mg via ORAL
  Filled 2024-10-15: qty 1

## 2024-10-15 MED ORDER — LACTULOSE 10 GM/15ML PO SOLN: 30.0000 g | Freq: Three times a day (TID) | ORAL | 0 refills | Status: AC

## 2024-10-15 NOTE — Discharge Summary (Signed)
 Physician Discharge Summary  DARREN NODAL FMW:[id number] DOB: [birth year] DOA: 10/02/2024  PCP: Orion Kerns, MD  Admit date: 10/02/2024 Discharge date: 10/15/2024  Time spent: 60 minutes  Recommendations for Outpatient Follow-up:  Follow-up with Camellia Loader, general surgery in 2 weeks. Follow-up with Dr. Dianna gastroenterology in 2 to 3 weeks. Follow-up with Orion Kerns, MD in 1 week.  On follow-up patient's orthostatic hypotension will need to be reassessed and if remains stable may consider decreasing midodrine  back to home regimen of 10 mg 3 times daily.  Patient will need a basic metabolic profile done in 1 week to follow-up on electrolytes and renal function.  Patient need a CBC done to follow-up on counts. Patient was discharged with home health services.   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  Diet recommendation: Full liquids/tube feeds  Filed Weights   10/13/24 0500 10/14/24 0456 10/15/24 0557  Weight: 58 kg 58.5 kg 57.1 kg    History of present illness:  HPI per Dr. Arthea Tawni KAMLA SKILTON is a 49 y.o. female with medical history significant for migraines and recurrent upper GI bleeds secondary to ulcers in the area of her gastrojejunostomy tube.  The patient has had years of complications since bariatric surgery in 2017. She has chronic abdominal pain and is on chronic narcotics.  She has a feeding tube in place.  She also receives IV iron   infusions for chronic blood loss anemia.  Her last iron  infusion was last week. She comes in with recurrent episodes vomiting coffee ground emesis since yesterday.  She called [facility]'s office and was advised to come to the ED.  The patient recognizes the symptoms as she has had this happen many times before.  She reports feeling very weak.  She has been vomiting up her chronic Dilaudid  so she is having increased abdominal pain as well. Denies any fever or diarrhea or shortness of breath. In the emergency department the patient continued to have episodes of coffee-ground emesis.  She had at least 8 episodes while in the emergency department.  Her hemoglobin was found to be 10.  Her BUN was slightly elevated at 21.  White count was 15.3.  Platelet count 417.  CT showed thickened small bowel loops along the region distal to the gastric surgical anastomosis c/w enteritis.  Her vitals are stable with a temperature of 98.5, a heart rate of 86, and blood pressure of 128/82.  A GI consultation was called by the ED provider.  She will be admitted to a progressive bed.  Since her vitals are stable and her hemoglobin also is stable, they will evaluate the patient in the AM. The patient's last bowel movement was 4 days ago.  She takes 2 medications for her constipation which usually work for her but she has been vomiting everything up.  Hospital Course:  #1 upper GI bleed/acute on chronic blood loss anemia/chronic marginal ulcer with perforation status post Arlyss omental repair 08/21/2024 - Patient status post surgical repair of perforated stomach ulcer  5 weeks ago. - Hemoglobin noted on admission 7.0 on 10/07/2024. - Status post transfusion 3 units PRBCs during this hospitalization with hemoglobin currently at 11.1 this morning.  - Patient still with complaints of lightheadedness and dizziness when working with PT and noted to be orthostatic per PT. -Repeat orthostatics on 10/12/2024 noted to be  positive.. - Patient denies any overt bleeding. - Patient underwent CT abdomen and pelvis that showed moderate to markedly thickened and poorly distended small bowel loops along the region distal to the gastric surgical anastomosis, which may represent sequelae associated with enteritis.  Hepatic steatosis.  Evidence of prior cholecystectomy and hysterectomy.  Percutaneous gastrostomy tube in place. - Patient underwent EGD 10/03/2024 that showed acute gastrojejunal anastomosis with ulceration and visible sutures; partial to jejunum limb with ulceration and jejunojejunal anastomosis also with ulceration; 3 nonbleeding cratered gastric ulcers with pigmented material at anastomosis. - EGD reported chronic marginal ulcer that will need surgical evaluation for revision of anastomosis. - GI signed off and recommended full liquid diet only. - General Surgery was following and no need for any surgical intervention but per general surgery with GI bleeding recurs patient might need to be transferred back to [facility]. - Patient maintained on metoprolol  and mesalamine . - Outpatient follow-up.   2.  Dizziness/orthostasis -It is noted that discharge was held 10/09/2024 due to dizziness and patient felt not ready for discharge. - Patient still with ongoing dizziness. - Patient noted to prior physician that she is on scheduled meclizine  at home. - Patient assessed by PT noted to be orthostatic. - See #9.   3.  Severe protein calorie malnutrition -Patient noted to have issues with leaking G-tube. - General Surgery was following but signed off. - IR exchanged Foley catheter and G-tube on 10/06/2024. - Patient tolerated tube feeds. -Patient will be placed back on home regimen of tube feeds on discharge.   4.  Folate deficiency anemia secondary to malabsorption/iron  deficiency anemia - Patient maintained on iron  supplementation. -Outpatient follow-up.   5.  Hyperlipidemia - Patient maintained on home regimen  statin.   6.  Depression/anxiety - Patient maintained on home regimen amitriptyline , bupropion , buspirone , duloxetine , Klonopin  as needed.   7.  Chronic pain with opiate dependence - Decreased home pain regimen to Dilaudid  2 mg every 6 hours as patient with orthostatic hypotension which may be contributing. - IV Dilaudid  discontinued. - Orthostasis improved and patient be discharged back on home regimen of Dilaudid .  -Outpatient follow-up.   8.  Chronic constipation - Patient maintained on Linzess . - Patient placed on Senokot, lactulose . -Patient noted not to have been able to bring her own Symproic  from home. -Outpatient follow-up with PCP   9.  Orthostatic hypotension - Patient with complaints of dizziness when orthostatics were checked the morning of 10/12/2024.   - Patient noted to be orthostatic with blood pressure of 134/89 with a pulse of 85 in the supine position, sitting up 127/87 with a pulse of 86, standing up 65/46 with a pulse of 119.   - Patient's home regimen of midodrine  was increased to 15 mg 3 times daily.  - Status post transfusion 2 units PRBCs 10/11/2024.   - TED hose.   - Patient initially had some improvement with orthostatics however Repeat orthostatics with a blood pressure of 114/83 and pulse of 92 sitting, standing at 0 minutes 79/57 with a pulse of 99, standing after 3 minutes with blood pressure of 83/70 with a pulse of 112. -Patient received a day of salt  tablets which was subsequently discontinued. -Patient also received a dose of IV albumin . -IV Dilaudid  was discontinued. -Patient's home regimen Dilaudid  was decreased to 2 mg every 4-6 hours. -Repeat orthostatics obtained on day of discharge with significant improvement with supine 139/96 with a pulse of 87 sitting 137/98 with a pulse of 93 standing at 0 minutes 122/87 with a pulse of 115, standing at 3 minutes 124/95 with a pulse of 119. -Patient remained asymptomatic. -Patient be discharged home in stable  and improved condition with close outpatient follow-up with PCP.    Procedures: Upper endoscopy 10/03/2024-Dr. Dianna CT abdomen and pelvis 10/02/2024 IR replaced G-tube 10/06/2024 per Darrell Allred, PA Transfused 1 unit PRBCs 10/07/2024 Transfused 2 units PRBCs 10/11/2024  Consultations: Gastroenterology: Dr. Dianna 10/03/2024    Discharge Exam: Vitals:   10/15/24 0415 10/15/24 1233  BP: 104/72 (!) 139/96  Pulse: 86 87  Resp: 16 16  Temp: 98.3 F (36.8 C) 99.1 F (37.3 C)  SpO2: 100% 100%    General: NAD Cardiovascular: Regular rate rhythm no murmurs rubs or gallops.  No JVD.  No lower extremity edema. Respiratory: Clear to auscultation bilaterally.  No wheezes, no crackles, no rhonchi.  Fair air movement.  Speaking in full sentences.  Discharge Instructions   Discharge Instructions     Amb Referral to Intravenous Iron  Therapy   Complete by: As directed    You have been referred to [facility] team for IV Iron  Infusions. The infusion pharmacy team will reach out to you with appointment information.    Primary Diagnosis Code for IV Iron : D50.0 - Iron  deficiency Anemia secondary to blood loss (Chronic)   Secondary diagnosis code for IV iron : Other   Comment: Acute on chronic blood loss anemia   Diet full liquid   Complete by: As directed    Increase activity slowly   Complete by: As directed    Increase activity slowly   Complete by: As directed       Allergies as of 10/15/2024       Reactions   Ciprofloxacin  Itching, Dermatitis, Rash   Coconut (cocos Nucifera) Anaphylaxis, Itching   Coconut Oil Anaphylaxis, Itching   Doxycycline Nausea And Vomiting   Oxycodone Anaphylaxis   Trazodone  And Nefazodone Rash   Hydroxyzine  Hives, Rash   Nefazodone Rash   Penicillins Hives, Rash, Other (See Comments)   Has patient had a PCN reaction causing immediate rash, facial/tongue/throat swelling, SOB or lightheadedness with hypotension: Yes   Silicone Rash   Tape  Rash, Other (See Comments)   And, paper tape causes a rash if worn for a prolong period of time   Trazodone  Rash   Wound Dressing Adhesive Rash        Medication List     STOP taking these medications    Gerhardt's butt cream Crea   omeprazole  40 MG capsule Commonly known as: PRILOSEC    torsemide  5 MG tablet Commonly known as: DEMADEX    valACYclovir  1000 MG tablet Commonly known as: VALTREX        TAKE these medications    ACCRUFeR  30 MG Caps Generic drug: Ferric Maltol  Take 30 mg by mouth 2 (two) times daily.   albuterol  108 (90 Base) MCG/ACT inhaler Commonly known as: VENTOLIN  HFA Inhale 2 puffs into the lungs every 6 (six) hours as needed for wheezing or shortness of breath.   amitriptyline  25 MG tablet Commonly known as: ELAVIL  Take 50 mg by mouth at bedtime.   atorvastatin  40 MG tablet Commonly known  as: LIPITOR Take 40 mg by mouth daily.   baclofen  10 MG tablet Commonly known as: LIORESAL  Take 10 mg by mouth 3 (three) times daily.   Botox  200 units injection Generic drug: botulinum toxin Type A  INJECT 155 UNITS INTRAMUSCULARLY INTO HEAD AND NECK MUSCLES EVERY 3 MONTHS   Buprenorphine  HCl 600 MCG Film Place 600 mcg under the tongue in the morning and at bedtime.   buPROPion  150 MG 24 hr tablet Commonly known as: WELLBUTRIN  XL Take 150 mg by mouth in the morning.   buPROPion  300 MG 24 hr tablet Commonly known as: WELLBUTRIN  XL Take 300 mg by mouth in the morning.   busPIRone  15 MG tablet Commonly known as: BUSPAR  Take 15 mg by mouth 3 (three) times daily.   clonazePAM  1 MG tablet Commonly known as: KLONOPIN  Take 1 mg by mouth 2 (two) times daily as needed for anxiety.   cyanocobalamin  1000 MCG/ML injection Commonly known as: VITAMIN B12 Inject 1,000 mcg into the muscle 2 (two) times a week.   diclofenac  Sodium 1 % Gel Commonly known as: VOLTAREN  Apply 2 g topically 4 (four) times daily as needed (Pain).   dicyclomine  10 MG  capsule Commonly known as: BENTYL  Take 10 mg by mouth 4 (four) times daily -  before meals and at bedtime.   DULoxetine  60 MG capsule Commonly known as: CYMBALTA  Take 1 capsule (60 mg total) by mouth 2 (two) times daily.   EPINEPHrine  0.3 mg/0.3 mL Soaj injection Commonly known as: EPI-PEN Inject 0.3 mg into the muscle as needed for anaphylaxis.   famotidine  20 MG tablet Commonly known as: PEPCID  Take 20 mg by mouth 2 (two) times daily.   ferrous sulfate  325 (65 FE) MG tablet Take 1 tablet (325 mg total) by mouth daily with breakfast.   fluticasone  50 MCG/ACT nasal spray Commonly known as: FLONASE  Place 1-2 sprays into both nostrils daily as needed for allergies or rhinitis.   gabapentin  600 MG tablet Commonly known as: NEURONTIN  Take 900 mg by mouth 3 (three) times daily.   HYDROmorphone  4 MG tablet Commonly known as: DILAUDID  Take 4 mg by mouth every 4 (four) hours.   lactulose  10 GM/15ML solution Commonly known as: CHRONULAC  Take 45 mLs (30 g total) by mouth 3 (three) times daily.   linaclotide  290 MCG Caps capsule Commonly known as: LINZESS  Take 290 mcg by mouth daily before breakfast.   mesalamine  1.2 g EC tablet Commonly known as: LIALDA  Take 2.4 g by mouth 2 (two) times daily.   methocarbamol  500 MG tablet Commonly known as: ROBAXIN  Take 2 tablets (1,000 mg total) by mouth every 6 (six) hours as needed (use for muscle cramps/pain). What changed: reasons to take this   midodrine  10 MG tablet Commonly known as: PROAMATINE  Take 1.5 tablets (15 mg total) by mouth 3 (three) times daily with meals. What changed: how much to take   misoprostol  200 MCG tablet Commonly known as: CYTOTEC  Take 200 mcg by mouth daily.   multivitamin with minerals Tabs tablet Take 1 tablet by mouth daily with breakfast.   Narcan  4 MG/0.1ML Liqd nasal spray kit Generic drug: naloxone  Place 1 spray into the nose daily as needed (overdose).   Nutren 2.0 Liqd -Nutren 2.0 @ 52  ml/hr x 14 hours via G-tube -Provides 1500 kcals, 63g protein and 519 ml H2O -Continue daily MVI via tube -Free water : 100 ml every 4 hours What changed:  how to take this when to take this additional instructions Another medication  with the same name was removed. Continue taking this medication, and follow the directions you see here.   nystatin  100000 UNIT/ML suspension Commonly known as: MYCOSTATIN  Take 5 mLs by mouth See admin instructions. 5 ml's by mouth three times a day AS DIRECTED   ondansetron  8 MG disintegrating tablet Commonly known as: ZOFRAN -ODT Take 8 mg by mouth See admin instructions. Dissolve 8 mg in the mouth every eight hours   oxybutynin  5 MG 24 hr tablet Commonly known as: DITROPAN -XL Take 10 mg by mouth at bedtime.   pantoprazole  40 MG tablet Commonly known as: PROTONIX  Take 1 tablet (40 mg total) by mouth 2 (two) times daily before a meal. Crush and take twice daily   PRESCRIPTION MEDICATION Take 400 mg by mouth See admin instructions. Magnesium  ES 400 mg SGC - Crush/dissolve 400 mg and take by mouth once a day   Qulipta  30 MG Tabs Generic drug: Atogepant  TAKE 1 TABLET BY MOUTH EVERY DAY   senna-docusate 8.6-50 MG tablet Commonly known as: Senokot-S Take 1 tablet by mouth 2 (two) times daily.   sucralfate  1 GM/10ML suspension Commonly known as: Carafate  Take 10 mLs (1 g total) by mouth 4 (four) times daily.   SUMAtriptan  50 MG tablet Commonly known as: IMITREX  Take 50 mg by mouth daily as needed for migraine or headache (and may repeat once in 2 hours if headache persists or recurs).   Symproic  0.2 MG Tabs Generic drug: Naldemedine Tosylate  Take 0.2 mg by mouth daily before breakfast.   thiamine  100 MG tablet Commonly known as: Vitamin B-1 Take 100 mg by mouth daily at 12 noon.   Vitamin D  (Ergocalciferol ) 1.25 MG (50000 UNIT) Caps capsule Commonly known as: DRISDOL  Take 50,000 Units by mouth every Monday.   VITAMIN D3 PO Take 1 capsule  by mouth daily.       Allergies[1]  Follow-up Information     Rogatnick, Lewis, MD. Schedule an appointment as soon as possible for a visit in 1 week(s).   Specialty: General Practice Contact information: [address] [place] [address] [phone]         Dianna Specking, MD. Schedule an appointment as soon as possible for a visit in 3 week(s).   Specialty: Gastroenterology Why: Follow-up in 2 to 3 weeks. Contact information: [address] [place] [address] [phone]         [facility] Follow up.   Why: formula already active w/[facility]-deliver formula to home        Care, [facility] Follow up.   Specialty: Home Health Services Why: HHRN/PT/OT Contact information: [address] [place] [address] [phone]         Tanda Locus, MD. Schedule an appointment as soon as possible for a visit in 2 week(s).   Specialty: General Surgery Contact information: [address] [place] [address] [phone]                  The results of significant diagnostics from this hospitalization (including imaging, microbiology, ancillary and laboratory) are listed below for reference.    Significant Diagnostic Studies: DG ABDOMEN PEG TUBE LOCATION Result Date: 10/06/2024 CLINICAL DATA:  14941 Gastrostomy status (HCC) 14941 EXAM: ABDOMEN - 1 VIEW COMPARISON:  KUB, 10/05/2024.  CT AP, 10/02/2024 and 09/23/2024. FINDINGS: Support lines; gastrostomy tube, well-positioned with contrast opacification of the remnant stomach antrum and proximal small bowel. Sacral nerve stimulator. Postsurgical changes with staple line and surgical  clips at the epigastrium. Mild air distention of colon at the LEFT hemiabdomen. No radio-opaque calculi or other significant radiographic abnormality are seen. IMPRESSION: Well-positioned gastrostomy tube, with contrast opacification of the remnant stomach antrum and proximal  small bowel. Electronically Signed   By: Thom Hall M.D.   On: 10/06/2024 16:04   IR REPLACE G-TUBE COMPLEX WO FLUORO (TRACT REV) Result Date: 10/06/2024 INDICATION: Catheter malfunctioning, dislodged. Request received for gastrostomy tube exchange Briefly, 49 year old female with complicated bariatric history, s/p sleeve gastrectomy and laparoscopic placement of gastrostomy tube into remnant stomach (09/14/2023). EXAM: GASTROSTOMY TUBE EXCHANGE MEDICATIONS: None ANESTHESIA/SEDATION: None CONTRAST:  None FLUOROSCOPY: None COMPLICATIONS: None immediate. PROCEDURE: Informed written consent was obtained from the patient after a thorough discussion of the procedural risks, benefits and alternatives. All questions were addressed. A timeout was performed prior to the initiation of the procedure. The existing Foley catheter was removed from the gastrostomy tube insertion site tract and a new 20 Fr balloon retention gastrostomy tube was inserted, balloon filled with 9 mL saline and secured to skin site. Tube was flushed without difficulty. Follow up abdominal film pending. IMPRESSION: Successful bedside exchange for a new 20 Fr balloon-retention gastrostomy tube. Performed by: Franky Rakers, PA-C under supervision of Thom Hall, MD Electronically Signed   By: Thom Hall M.D.   On: 10/06/2024 14:30   DG ABDOMEN PEG TUBE LOCATION Result Date: 10/05/2024 CLINICAL DATA:  Peg tube dysfunction. EXAM: ABDOMEN - 1 VIEW COMPARISON:  Abdomen and pelvis CT dated 10/02/2024 FINDINGS: The PEG tube tip and balloon are obscured by injected contrast in the distal stomach and duodenal bulb. No extravasated contrast is seen. Normal bowel-gas pattern. Left upper quadrant surgical clips and bilateral lower quadrant. Left sacral neural stimulator. Unremarkable bones. IMPRESSION: PEG tube tip and balloon are obscured by injected contrast in the distal stomach and duodenal bulb. No extravasated contrast is seen. Electronically Signed    By: Elspeth Bathe M.D.   On: 10/05/2024 13:55   CT ABDOMEN PELVIS W CONTRAST Result Date: 10/02/2024 CLINICAL DATA:  Hematemesis with fatigue and weakness. EXAM: CT ABDOMEN AND PELVIS WITH CONTRAST TECHNIQUE: Multidetector CT imaging of the abdomen and pelvis was performed using the standard protocol following bolus administration of intravenous contrast. RADIATION DOSE REDUCTION: This exam was performed according to the departmental dose-optimization program which includes automated exposure control, adjustment of the mA and/or kV according to patient size and/or use of iterative reconstruction technique. CONTRAST:  OMNIPAQUE  IOHEXOL  300 MG/ML  SOLN COMPARISON:  September 23, 2024 FINDINGS: Lower chest: No acute abnormality. Hepatobiliary: There is diffuse fatty infiltration of the liver parenchyma. No focal liver abnormality is seen. Status post cholecystectomy. No biliary dilatation. Pancreas: Unremarkable. No pancreatic ductal dilatation or surrounding inflammatory changes. Spleen: A 2.5 cm well-defined area of parenchymal low attenuation is seen within the anterior aspect of an otherwise normal-appearing spleen. Adrenals/Urinary Tract: Adrenal glands are unremarkable. Kidneys are normal, without renal calculi, focal lesion, or hydronephrosis. Urinary bladder is poorly distended and is otherwise unremarkable. Stomach/Bowel: Surgical sutures are seen throughout the gastric region with surgically anastomosed bowel noted within the mid left abdomen. A percutaneous gastrostomy tube is in place with surgical sutures seen adjacent to the site of insufflator bulb insertion. Appendix appears normal. No evidence of bowel dilatation. Moderate to markedly thickened, poorly distended small bowel loops are seen along the region distal to the gastric surgical anastomosis. Vascular/Lymphatic: No significant vascular findings are present. No enlarged abdominal or pelvic lymph nodes. Reproductive: Status post  hysterectomy.  No adnexal masses. Other: No abdominal wall hernia or abnormality. No abdominopelvic ascites. Musculoskeletal: No acute or significant osseous findings. IMPRESSION: 1. Moderate to markedly thickened, poorly distended small bowel loops along the region distal to the gastric surgical anastomosis, which may represent sequelae associated with enteritis. 2. Hepatic steatosis. 3. Evidence of prior cholecystectomy and hysterectomy. 4. Percutaneous gastrostomy tube in place. Electronically Signed   By: Suzen Dials M.D.   On: 10/02/2024 19:32   CT ABDOMEN PELVIS W CONTRAST Result Date: 09/23/2024 EXAM: CT ABDOMEN AND PELVIS WITH CONTRAST 09/23/2024 01:54:00 PM TECHNIQUE: CT of the abdomen and pelvis was performed with the administration of intravenous contrast and 450 mL of oral barium sulfate  2% suspension (READI-CAT 2 ). Multiplanar reformatted images are provided for review. Automated exposure control, iterative reconstruction, and/or weight-based adjustment of the mA/kV was utilized to reduce the radiation dose to as low as reasonably achievable. COMPARISON: CT abdomen pelvis 08/21/2024 CLINICAL HISTORY: FINDINGS: LOWER CHEST: No acute abnormality. LIVER: The liver is unremarkable. GALLBLADDER AND BILE DUCTS: Gallbladder is unremarkable. No biliary ductal dilatation. SPLEEN: No acute abnormality. PANCREAS: No acute abnormality. ADRENAL GLANDS: No acute abnormality. KIDNEYS, URETERS AND BLADDER: No stones in the kidneys or ureters. No hydronephrosis. No perinephric or periureteral stranding. Urinary bladder is unremarkable. No filling defects of the partially visualized collecting systems on delayed imaging. GI AND BOWEL: Gastric bypass surgical changes are noted. Approximately 2 clips are noted with a balloon inflated within the gastric remnants. No small or large bowel thickening or dilatation. The appendix is unremarkable. PERITONEUM AND RETROPERITONEUM: No ascites. No free air. VASCULATURE: Aorta is normal in  caliber. LYMPH NODES: No lymphadenopathy. REPRODUCTIVE ORGANS: No acute abnormality. BONES AND SOFT TISSUES: Dermal thickening along the gastrostomy tube insertion site as well as the umbilicus with associated suture staple. No organized fluid collection. No finding to suggest definite enterocutaneous fistula. Tiny fat and free fluid hernia inferior to the gastrostomy tube and superior to the umbilicus (602.72) with abdominal defect of 1 mm. No acute osseous abnormality. Neurostimulator with path along the left gluteal soft tissues and tip terminating within the left presacral space. IMPRESSION: 1. No acute findings in the abdomen or pelvis. 2. Dermal thickening along the gastrostomy tube insertion site as well as the umbilicus with associated suture staple. No organized fluid collection. No finding to suggest definite enterocutaneous fistula. Tiny fat and free fluid hernia inferior to the gastrostomy tube and superior to the umbilicus (602.72) with abdominal defect of 1 mm. 3. Gastric bypass surgical changes with clip and balloon inflated within the gastric remnants. Electronically signed by: Morgane Naveau MD 09/23/2024 03:08 PM EST RP Workstation: [id number]    Microbiology: No results found for this or any previous visit (from the past 240 hours).   Labs: Basic Metabolic Panel: Recent Labs  Lab 10/09/24 0456 10/10/24 0517 10/11/24 0539 10/12/24 0556 10/14/24 0522 10/15/24 0527  NA 137 139 139 140 142 138  K 4.4 4.4 4.4 4.4 4.5 4.4  CL 103 106 105 109 107 105  CO2 27 30 27 24 29 27   GLUCOSE 115* 87 74 99 80 88  BUN 7 5* 6 6 8 8   CREATININE 0.58 0.59 0.54 0.63 0.61 0.54  CALCIUM  8.7* 8.6* 8.6* 8.8* 8.9 9.1  MG 2.3 2.5* 2.4  --   --   --    Liver Function Tests: No results for input(s): AST, ALT, ALKPHOS, BILITOT, PROT, ALBUMIN  in the last 168 hours. No results for input(s): LIPASE, AMYLASE in  the last 168 hours. No results for input(s): AMMONIA in the last 168  hours. CBC: Recent Labs  Lab 10/09/24 0456 10/10/24 0517 10/11/24 0539 10/12/24 0556 10/13/24 0552 10/14/24 0522 10/15/24 0527  WBC 12.5* 6.4 8.6 5.9 10.4 5.8 6.7  NEUTROABS 9.6* 3.4 5.7  --   --   --   --   HGB 8.6* 8.1* 8.0* 11.5* 13.0 11.1* 11.7*  HCT 27.3* 26.7* 26.6* 35.6* 41.5 35.0* 35.6*  MCV 94.1 97.4 96.7 94.9 95.6 94.3 93.9  PLT 342 354 376 405* 428* 413* 410*   Cardiac Enzymes: No results for input(s): CKTOTAL, CKMB, CKMBINDEX, TROPONINI in the last 168 hours. BNP: BNP (last 3 results) No results for input(s): BNP in the last 8760 hours.  ProBNP (last 3 results) No results for input(s): PROBNP in the last 8760 hours.  CBG: Recent Labs  Lab 10/14/24 2004 10/14/24 2354 10/15/24 0433 10/15/24 0800 10/15/24 1212  GLUCAP 94 92 99 102* 142*       Signed:  Toribio Hummer MD.  [facility] 10/15/2024, 3:56 PM        [1]  Allergies Allergen Reactions   Ciprofloxacin  Itching, Dermatitis and Rash   Coconut (Cocos Nucifera) Anaphylaxis and Itching   Coconut Oil Anaphylaxis and Itching   Doxycycline Nausea And Vomiting   Oxycodone Anaphylaxis   Trazodone  And Nefazodone Rash   Hydroxyzine  Hives and Rash   Nefazodone Rash   Penicillins Hives, Rash and Other (See Comments)    Has patient had a PCN reaction causing immediate rash, facial/tongue/throat swelling, SOB or lightheadedness with hypotension: Yes   Silicone Rash   Tape Rash and Other (See Comments)    And, paper tape causes a rash if worn for a prolong period of time   Trazodone  Rash   Wound Dressing Adhesive Rash

## 2024-10-19 ENCOUNTER — Ambulatory Visit (HOSPITAL_COMMUNITY)
Admission: RE | Admit: 2024-10-19 | Discharge: 2024-10-19 | Disposition: A | Discharge status: Home / Self Care | Source: Ambulatory Visit | Attending: Internal Medicine | Admitting: Internal Medicine

## 2024-10-19 VITALS — BP 132/92 | HR 87 | Temp 97.5°F | Resp 18

## 2024-10-19 DIAGNOSIS — K285 Chronic or unspecified gastrojejunal ulcer with perforation: Secondary | ICD-10-CM | POA: Diagnosis present

## 2024-10-19 DIAGNOSIS — D509 Iron deficiency anemia, unspecified: Secondary | ICD-10-CM | POA: Diagnosis present

## 2024-10-19 MED ORDER — SODIUM CHLORIDE 0.9 % IV SOLN
510.0000 mg | Freq: Once | INTRAVENOUS | Status: AC
  Administered 2024-10-19: 510 mg via INTRAVENOUS
  Filled 2024-10-19: qty 510

## 2024-10-19 NOTE — Progress Notes (Signed)
 Diagnosis:Iron  deficiency Anemia    Provider: Sophie Eaton, MND    Procedure:Feraheme  Iron  infusion    Note: Patient received Feraheme  iron  510 mg via PIV infusion 1 of 1 for iron  deficiency anemia. Patient tolerated infusion well, no side effects.  Patient declined to stay 30 minute post observation after infusion.  Patient declined AVS, states she uses mychart.  Patient has appt with PCP for follow up labs .  Patient alert, oriented and ambulatory upon discharge

## 2024-11-02 ENCOUNTER — Ambulatory Visit: Admitting: Neurology

## 2024-11-02 ENCOUNTER — Encounter: Payer: Self-pay | Admitting: Neurology

## 2024-11-02 VITALS — BP 105/70 | HR 96 | Ht 67.0 in | Wt 122.3 lb

## 2024-11-02 DIAGNOSIS — R296 Repeated falls: Secondary | ICD-10-CM

## 2024-11-02 DIAGNOSIS — Z79899 Other long term (current) drug therapy: Secondary | ICD-10-CM

## 2024-11-02 DIAGNOSIS — R269 Unspecified abnormalities of gait and mobility: Secondary | ICD-10-CM

## 2024-11-02 DIAGNOSIS — R200 Anesthesia of skin: Secondary | ICD-10-CM

## 2024-11-02 DIAGNOSIS — G43711 Chronic migraine without aura, intractable, with status migrainosus: Secondary | ICD-10-CM

## 2024-11-02 NOTE — Progress Notes (Signed)
 "  [facility]  PATIENT: [name] DOB: 1975/10/26  REFERRING DOCTOR OR PCP:  Ezzard Brochure, MD SOURCE: patient,   _________________________________   HISTORICAL  CHIEF COMPLAINT:  Chief Complaint  Patient presents with   Follow-up    Rm11, Alone, Follow up for tia and migriane, and hospital follow up: gait/standing difficulty. Pt would like electric scooter/wheelchair to rehab medical via fax at [phone] [facility]     HISTORY OF PRESENT ILLNESS:  She is a 49 y.o. woman with chronic migraines and a h/o TIA.  UPDATE 11/02/2024 She has previously seen Dr. Jenel, Dr. Onita and Lauraine Born, NP  She  reports mobility issues with falling, off balanced gait.     She sometimes feels more off balanced ad falls and other times just falls.   She gets lightheaded and falls sometimes.   She feels her legs are weak, R>L.    She reports 12 falls in last few months.   She trips over the right foot at times  She has a walker but reports a fall even with that.     Notes some bladder frequency but no incontinence  She has had some issues with gait in past.  She had a normal brain MRI in 2023   She had a normal cervical spine MRI in 2017.  She reports Dr. Jenel told her she had a polyneuropathy  She reports STM issues since 2015.  She has trouble with spelling.   She has polypharmacy with multiple CNS drugs.   She is on amitriptyline , baclofen , buprenorphone, dilaudid , clonazepam , duloxetine , gabapentin , methocarbamol .    Despite the Botox , she reports migraines daily.   She does report that the intensity is less with Botox .       From Dr. Onita 08/03/2024 She has been a patient of our clinic for many years for migraine headaches, now on polypharmacy for that, including Qulipta  daily, Botox  injection every 3 months as preventive medication, Imitrex  as needed, she also has significant mood disorder, on polypharmacy, Wellbutrin , BuSpar , Cymbalta , despite that,  she has frequent headaches,   She is here to follow-up her July 26, 2024 evaluation for possible TIA,   Reviewed hospital record from [facility]  July 26, 2024, she presented with sudden onset numbness tingling of her forehead, left side of her face, rule out stroke CT head and MRI of the brain showed no acute abnormality CT angiogram head and neck showed no large vessel disease   TEE was normal EKG was normal She was given prescription of aspirin , statin, but she has not started new prescription yet, worry about the side effect,   Laboratory evaluations, UDS was negative, normal CMP, creatinine 0.74, CBC, hemoglobin of 12, chest x-ray no acute abnormality, B12 975, ferritin 6,    Reviewed lipid profile in March 2025, showed no significant abnormality   She continue complains of left forehead region numbness, she scratched herself this morning, complains of deep achy pain underneath the skin,  REVIEW OF SYSTEMS: Constitutional: No fevers, chills, sweats, or change in appetite Eyes: No visual changes, double vision, eye pain Ear, nose and throat: No hearing loss, ear pain, nasal congestion, sore throat Cardiovascular: No chest pain, palpitations Respiratory:  No shortness of breath at rest or with exertion.   No wheezes GastrointestinaI: No nausea, vomiting, diarrhea, abdominal pain, fecal incontinence Genitourinary:  No dysuria, urinary retention or frequency.  No nocturia. Musculoskeletal:  No neck pain, back pain Integumentary: No rash, pruritus, skin lesions Neurological: as  above Psychiatric: No depression at this time.  No anxiety Endocrine: No palpitations, diaphoresis, change in appetite, change in weigh or increased thirst Hematologic/Lymphatic:  No anemia, purpura, petechiae. Allergic/Immunologic: No itchy/runny eyes, nasal congestion, recent allergic reactions, rashes  ALLERGIES: Allergies[1]  HOME MEDICATIONS: Current Medications[2]  PAST MEDICAL  HISTORY: Past Medical History:  Diagnosis Date   Anal fissure - posterior 10/16/2014    OCC  ISSUES   Anxiety    doesn't take anything   Asthma    has Albuterol  inhaler as needed   Bronchitis    in winter   Chronic headache disorder 07/22/2016   Clostridium difficile infection 04/20/2012   Dehydration    Depression    Family history of adverse reaction to anesthesia    pt mom gets sick   GERD (gastroesophageal reflux disease)    takes Pantoprazole  daily   Headache    Hiatal hernia    neuropathy - mild in arms and legs   History of blood transfusion    last transfusion was 04/04/2016=Benadryl  was given d/t itching. States she always itches with transfusion.    History of migraine    last one 05/01/16   History of stomach ulcers    History of urinary tract infection LAST 2 WEEKS AGO   IDA (iron  deficiency anemia)    Internal and external bleeding hemorrhoids 06/11/2014   Joint pain    Joint swelling    Left sided chronic colitis - segmental 06/11/2014   Marginal ulcer 10/25/2017   Motion sickness    Nausea    takes Zofran  as needed   Nausea and vomiting    for 1 year   Oligouria    Osteoarthritis    lower back, knees, wrists - no meds   Peripheral neuropathy 03/01/2019   Personal history of gastric bypass    Postoperative nausea and vomiting 01/21/2016   wants scopolamine  patch   Right carpal tunnel syndrome 03/01/2019   SVD (spontaneous vaginal delivery)    x 4   TOBACCO USER 10/02/2009   Qualifier: Diagnosis of  By: Lida MD, Artist     Tremor 08/31/2018   UC (ulcerative colitis) (HCC)    supposed to be taking Lialda  and Bentyl  but has been off since gastric sleeve   Unsteady gait when walking 2024   uses a walker   Vertigo    doesn't take any meds    PAST SURGICAL HISTORY: Past Surgical History:  Procedure Laterality Date   ABDOMINAL HYSTERECTOMY     PARTIAL   BIOPSY  01/20/2023   Procedure: BIOPSY;  Surgeon: Saintclair Jasper, MD;  Location: [facility];   Service: Gastroenterology;;   BLADDER STIMULATOR IMPLANT     COLONOSCOPY  2007   for rectal bleeding; [facility]   COLONOSCOPY N/A 06/11/2014   Procedure: COLONOSCOPY;  Surgeon: Lupita FORBES Commander, MD;  [facility]: [facility];  Service: Endoscopy;  Laterality: N/A;   COLONOSCOPY WITH PROPOFOL  N/A 03/02/2017   Procedure: COLONOSCOPY WITH PROPOFOL ;  Surgeon: Ethyl Lenis, MD;  Location: [facility];  Service: General;  Laterality: N/A;   DILATION AND CURETTAGE OF UTERUS     ESOPHAGOGASTRODUODENOSCOPY N/A 07/25/2023   Procedure: ESOPHAGOGASTRODUODENOSCOPY (EGD);  Surgeon: Kriss Estefana DEL, DO;  Location: [facility];  Service: Gastroenterology;  Laterality: N/A;   ESOPHAGOGASTRODUODENOSCOPY N/A 10/02/2023   Procedure: ESOPHAGOGASTRODUODENOSCOPY (EGD);  Surgeon: Burnette Fallow, MD;  Location: [facility];  Service: Gastroenterology;  Laterality: N/A;   ESOPHAGOGASTRODUODENOSCOPY N/A 08/11/2024   Procedure: EGD (ESOPHAGOGASTRODUODENOSCOPY);  Surgeon: Saintclair Jasper, MD;  [facility];  Service: Gastroenterology;  Laterality: N/A;   ESOPHAGOGASTRODUODENOSCOPY N/A 10/03/2024   Procedure: EGD (ESOPHAGOGASTRODUODENOSCOPY);  Surgeon: Dianna Specking, MD;  Location: [facility];  Service: Gastroenterology;  Laterality: N/A;   ESOPHAGOGASTRODUODENOSCOPY (EGD) WITH PROPOFOL  N/A 04/06/2016   Procedure: ESOPHAGOGASTRODUODENOSCOPY (EGD) WITH PROPOFOL ;  Surgeon: Alm Angle, MD;  [facility];  Service: General;  Laterality: N/A;   ESOPHAGOGASTRODUODENOSCOPY (EGD) WITH PROPOFOL  N/A 03/02/2017   Procedure: ESOPHAGOGASTRODUODENOSCOPY (EGD) WITH PROPOFOL ;  Surgeon: Angle Alm, MD;  Location: [facility];  Service: General;  Laterality: N/A;   ESOPHAGOGASTRODUODENOSCOPY (EGD) WITH PROPOFOL  N/A 05/20/2017   Procedure: ESOPHAGOGASTRODUODENOSCOPY (EGD) WITH PROPOFOL  ERAS PATHWAY;  Surgeon: Angle Alm, MD;  Location: [facility];  Service: General;  Laterality: N/A;    ESOPHAGOGASTRODUODENOSCOPY (EGD) WITH PROPOFOL  N/A 10/07/2017   Procedure: ESOPHAGOGASTRODUODENOSCOPY (EGD) WITH PROPOFOL ;  Surgeon: Angle Alm, MD;  Location: [facility];  Service: General;  Laterality: N/A;   ESOPHAGOGASTRODUODENOSCOPY (EGD) WITH PROPOFOL  N/A 01/20/2023   Procedure: ESOPHAGOGASTRODUODENOSCOPY (EGD) WITH PROPOFOL ;  Surgeon: Saintclair Jasper, MD;  [facility];  Service: Gastroenterology;  Laterality: N/A;   EXCISION OF SKIN TAG  06/08/2017   Procedure: EXCISION OF VULVAR SKIN TAGS X2;  Surgeon: Fredirick Glenys RAMAN, MD;  [facility]: [facility];  Service: Gynecology;;   FOOT SURGERY Bilateral    x 2   GASTRIC ROUX-EN-Y N/A 12/14/2016   Procedure: LAPAROSCOPIC REVISION SLEEVE GASTRECTOMY TO  ROUX-Y-GASTRIC BY-PASS, UPPER ENDO;  Surgeon: Morene Olives, MD;  [facility];  Service: General;  Laterality: N/A;   GASTROJEJUNOSTOMY N/A 05/11/2016   Procedure: LAPAROSCOPIC PLACEMENT  OF FEEDING  JEJUNOSTOMY TUBE;  Surgeon: Morene Olives, MD;  [facility];  Service: General;  Laterality: N/A;   HEMORRHOIDECTOMY WITH HEMORRHOID BANDING     HEMOSTASIS CLIP PLACEMENT  07/25/2023   Procedure: HEMOSTASIS CLIP PLACEMENT;  Surgeon: Kriss Estefana DEL, DO;  [facility];  Service: Gastroenterology;;   HOT HEMOSTASIS N/A 07/25/2023   Procedure: HOT HEMOSTASIS (ARGON PLASMA COAGULATION/BICAP);  Surgeon: Kriss Estefana DEL, DO;  Location: [facility];  Service: Gastroenterology;  Laterality: N/A;   IR FLUORO GUIDE CV LINE RIGHT  04/06/2017   IR GASTROSTOMY TUBE MOD SED  08/25/2023   IR GENERIC HISTORICAL  05/19/2016   IR CM INJ ANY COLONIC TUBE W/FLUORO 05/19/2016 Marcey Moan, MD [facility]   IR PATIENT EVAL TECH 0-60 MINS  11/30/2017   IR REPLC DUODEN/JEJUNO TUBE PERCUT W/FLUORO  03/14/2018   IR REPLC DUODEN/JEJUNO TUBE PERCUT W/FLUORO  03/15/2018   IR US  GUIDE VASC ACCESS LEFT  08/25/2023   IR US  GUIDE VASC ACCESS LEFT  08/25/2023   IR US  GUIDE VASC ACCESS  RIGHT  04/06/2017   j tube removed march 2018     KNEE ARTHROSCOPY Left 09/06/2014   LAPAROSCOPIC GASTRIC SLEEVE RESECTION N/A 01/14/2016   Procedure: LAPAROSCOPIC GASTRIC SLEEVE RESECTION;  Surgeon: Morene Olives, MD;  [facility];  Service: General;  Laterality: N/A;   LAPAROSCOPIC INSERTION GASTROSTOMY TUBE N/A 09/14/2023   Procedure: LAPAROSCOPIC REMNANT GASTROSTOMY TUBE PLACEMENT;  Surgeon: Lyndel Deward PARAS, MD;  [facility];  Service: General;  Laterality: N/A;   LAPAROSCOPIC REVISION OF GASTROJEJUNOSTOMY N/A 10/25/2017   Procedure: LAPAROSCOPIC REVISION OF GASTROJEJUNOSTOMY AND PARTIAL GASTRECTOMY, WITH PLACEMENT OF FEEDING GASTROSTOMY TUBE;  Surgeon: Olives Morene, MD;  [facility];  Service: General;  Laterality: N/A;   LAPAROSCOPIC TUBAL LIGATION  10/16/2011   Procedure: LAPAROSCOPIC TUBAL LIGATION;  Surgeon: Nena DELENA App, MD;  [facility];  Service: Gynecology;  Laterality: Bilateral;   LAPAROSCOPY N/A 08/21/2024   Procedure: LAPAROSCOPY, DIAGNOSTIC, LAPAROSCOPIC GRAM PATCH OF ULCER;  Surgeon: Tanda Locus, MD;  [facility];  Service: General;  Laterality: N/A;  DIAGNOSTIC LAPAROSCOPY   NOVASURE ABLATION  09/28/2010   mild persistent vaginal bleeding   right knee arthroscopy     05/07/2016   TOTAL KNEE ARTHROPLASTY Left 03/10/2018   Procedure: LEFT TOTAL KNEE ARTHROPLASTY;  Surgeon: Ernie Cough, MD;  [facility];  Service: Orthopedics;  Laterality: Left;  70 mins   TOTAL KNEE ARTHROPLASTY Right 01/23/2020   Procedure: TOTAL KNEE ARTHROPLASTY, BILATERAL TROCHANTERIC INJECTION;  Surgeon: Ernie Cough, MD;  [facility];  Service: Orthopedics;  Laterality: Right;  70 mins for Bilateral Troch injection 1 cc Depo and 2 CC Lidocaine    VAGINAL HYSTERECTOMY Bilateral 06/08/2017   Procedure: HYSTERECTOMY VAGINAL W/ BILATERAL SALPINGECTOMY;  Surgeon: Fredirick Glenys RAMAN, MD;  [facility];  Service: Gynecology;  Laterality: Bilateral;    wisdom teeth extracted      FAMILY HISTORY: Family History  Problem Relation Age of Onset   Hypertension Mother    Diabetes Mother    Sarcoidosis Mother        lungs and skin   Asthma Mother    Hypertension Father    Diabetes Father    Asthma Son    Tics Son    Asthma Sister    Cancer Sister        possible pancreatic cancer   Adrenal disorder Sister        Tumor    Asthma Brother    Asthma Daughter        died age 59.5   Cancer Daughter 4       brain; died age 59.5   Asthma Son    Cancer Paternal Aunt        brain, colon, lung and esophagus; unsure of primary    Breast cancer Paternal Aunt     SOCIAL HISTORY: Social History   Socioeconomic History   Marital status: Single    Spouse name: Not on file   Number of children: 3   Years of education: Some college   Highest education level: Not on file  Occupational History   Occupation: English As A Second Language Teacher    Comment: Currently out on disability d/t surg  Tobacco Use   Smoking status: Former    Current packs/day: 0.00    Average packs/day: 0.5 packs/day for 20.0 years (10.0 ttl pk-yrs)    Types: Cigarettes    Start date: 03/17/1994    Quit date: 03/17/2014    Years since quitting: 10.6   Smokeless tobacco: Never  Vaping Use   Vaping status: Never Used  Substance and Sexual Activity   Alcohol use: Not Currently   Drug use: No   Sexual activity: Not Currently    Birth control/protection: Surgical, Abstinence  Other Topics Concern   Not on file  Social History Narrative   Lives with 2 sons   Right Handed   Drinks caffeine2-3 cups daily   Social Drivers of Health   Tobacco Use: Medium Risk (11/02/2024)   Patient History    Smoking Tobacco Use: Former    Smokeless Tobacco Use: Never    Passive Exposure: Not on Actuary Strain: Not on file  Food Insecurity: No Food Insecurity (10/10/2024)   Epic    Worried About Radiation Protection Practitioner of Food in the Last Year: Never true    [employer] of Food in  the Last  Year: Never true  Transportation Needs: No Transportation Needs (10/25/2024)   Received from [facility] - Transportation    In the past 12 months, has lack of transportation kept you from medical appointments or from getting medications?: No    Lack of Transportation (Non-Medical): No  Physical Activity: Not on file  Stress: Not on file  Social Connections: Not on file  Intimate Partner Violence: Not At Risk (10/10/2024)   Epic    Fear of Current or Ex-Partner: No    Emotionally Abused: No    Physically Abused: No    Sexually Abused: No  Depression (PHQ2-9): Low Risk (10/10/2024)   Depression (PHQ2-9)    PHQ-2 Score: 0  Alcohol Screen: Not on file  Housing: Unknown (10/25/2024)   Received from [facility]   Epic    Unable to Pay for Housing in the Last Year: Not on file    Number of Times Moved in the Last Year: Not on file    At any time in the past 12 months, were you homeless or living in a shelter (including now)?: No  Utilities: Not At Risk (10/10/2024)   Epic    Threatened with loss of utilities: No  Health Literacy: Not on file       PHYSICAL EXAM  Vitals:   11/02/24 0927  BP: 105/70  Pulse: 96  Weight: 122 lb 4.8 oz (55.5 kg)  Height: 5' 7 (1.702 m)    Body mass index is 19.15 kg/m.  No results found.   General: The patient is well-developed and well-nourished and in no acute distress in a wheelchai  HEENT:  Head is [place]/AT.  Sclera are anicteric.     Neck: No carotid bruits are noted.  The neck is nontender.  Cardiovascular: The heart has a regular rate and rhythm with a normal S1 and S2. There were no murmurs, gallops or rubs.    Skin: Extremities are without rash or edema.  Musculoskeletal:  Back is mildly tender  Neurologic Exam  Mental status: The patient is alert and oriented x 3 at the time of the examination. The patient has apparent normal recent and remote memory, with an apparently normal  attention span and concentration ability.   Speech is normal.  Cranial nerves: Extraocular movements are full. Pupils are equal, round, and reactive to light and accomodation. Facial symmetry is present. There is good facial sensation to soft touch bilaterally.Facial strength is normal.  Trapezius and sternocleidomastoid strength is normal. No dysarthria is noted.  The tongue is midline, and the patient has symmetric elevation of the soft palate. No obvious hearing deficits are noted.  Motor:  Muscle bulk is normal.   Tone is normal. Strength is  5 / 5 in the arms.   Strength 4+ to 5/5 in legs.    Sensory: Sensory testing is intact to pinprick, soft touch and vibration sensation in arms but feels the tuning fork less in legs in a length dependent manner  Coordination: Cerebellar testing reveals good finger-nose-finger and heel-to-shin bilaterally.  Gait and station: Station is normal once standing (uses arms to stand).   She has a wide gait with variability.  Tandem gait is poor. Romberg is negative.   Reflexes: Deep tendon reflexes are symmetric and normal bilaterally.   Plantar responses are flexor.      DIAGNOSTIC DATA (LABS, IMAGING, TESTING) - I reviewed patient records, labs, notes, testing and imaging myself where available.  Lab Results  Component Value Date   WBC 6.7 10/15/2024   HGB 11.7 (L) 10/15/2024   HCT 35.6 (L) 10/15/2024   MCV 93.9 10/15/2024   PLT 410 (H) 10/15/2024      Component Value Date/Time   NA 138 10/15/2024 0527   NA 137 06/11/2022 1620   K 4.4 10/15/2024 0527   CL 105 10/15/2024 0527   CO2 27 10/15/2024 0527   GLUCOSE 88 10/15/2024 0527   BUN 8 10/15/2024 0527   BUN 20 06/11/2022 1620   CREATININE 0.54 10/15/2024 0527   CREATININE 0.88 06/05/2016 1003   CALCIUM  9.1 10/15/2024 0527   PROT 6.1 (L) 10/02/2024 1830   PROT 7.4 06/11/2022 1620   ALBUMIN  3.2 (L) 10/02/2024 1830   ALBUMIN  4.7 06/11/2022 1620   AST 13 (L) 10/02/2024 1830   ALT 7  10/02/2024 1830   ALKPHOS 77 10/02/2024 1830   BILITOT <0.2 10/02/2024 1830   BILITOT 0.4 06/11/2022 1620   GFRNONAA >60 10/15/2024 0527   GFRNONAA 84 06/05/2016 1003   GFRAA >60 01/24/2020 0253   GFRAA >89 06/05/2016 1003   Lab Results  Component Value Date   CHOL 168 10/30/2017   HDL 60 10/30/2017   LDLCALC 88 10/30/2017   TRIG 98 10/30/2017   CHOLHDL 2.8 10/30/2017   Lab Results  Component Value Date   HGBA1C 5.1 07/26/2017   Lab Results  Component Value Date   VITAMINB12 945 (H) 08/24/2024   Lab Results  Component Value Date   TSH 1.140 01/26/2018       ASSESSMENT AND PLAN  Frequent falls  Gait disorder - Plan: MR CERVICAL SPINE WO CONTRAST, Multiple Myeloma Panel (SPEP&IFE w/QIG), Sjogren's syndrome antibods(ssa + ssb)  Numbness  Chronic migraine without aura, with intractable migraine, so stated, with status migrainosus  Polypharmacy  Frequent falls of unknown etiology.  We need to check MRI of the cervical spine to assess for stenosis/myelopathy.   Also will check SPEP/IEF, B12, SSA/SSB as she reports some leg numbness.  Falls could also be functional or related to polypharmacy Stay active and exercise as tolerated She will continue Botox  for migraine Rtc 1 year or call if new or worsening neurologic symptoms.    40-minute office visit with the majority of the time spent face-to-face for history and physical, discussion/counseling and decision-making.  Additional time with record review and documentation.   Hollee Fate A. Vear, MD, [facility] 11/02/2024, 10:12 AM Certified in Neurology, Clinical Neurophysiology, Sleep Medicine and Neuroimaging  [facility] [address], [address] [place], [place] [address] [phone]     [1]  Allergies Allergen Reactions   Ciprofloxacin  Itching, Dermatitis and Rash   Coconut (Cocos Nucifera) Anaphylaxis and Itching   Doxycycline Nausea And Vomiting   Oxycodone Anaphylaxis   Trazodone  And  Nefazodone Rash   Hydroxyzine  Hives and Rash   Nefazodone Rash   Penicillins Hives, Rash and Other (See Comments)    Has patient had a PCN reaction causing immediate rash, facial/tongue/throat swelling, SOB or lightheadedness with hypotension: Yes   Silicone Rash   Tape Rash and Other (See Comments)    And, paper tape causes a rash if worn for a prolong period of time   Wound Dressing Adhesive Rash  [2]  Current Outpatient Medications:    ACCRUFER  30 MG CAPS, Take 30 mg by mouth 2 (two) times daily., Disp: , Rfl:    albuterol  (VENTOLIN  HFA) 108 (90 Base) MCG/ACT inhaler, Inhale 2 puffs into the lungs every 6 (  six) hours as needed for wheezing or shortness of breath., Disp: , Rfl:    amitriptyline  (ELAVIL ) 25 MG tablet, Take 50 mg by mouth at bedtime., Disp: , Rfl:    atorvastatin  (LIPITOR) 40 MG tablet, Take 40 mg by mouth daily., Disp: , Rfl:    baclofen  (LIORESAL ) 10 MG tablet, Take 10 mg by mouth 3 (three) times daily., Disp: , Rfl:    botulinum toxin Type A  (BOTOX ) 200 units injection, INJECT 155 UNITS INTRAMUSCULARLY INTO HEAD AND NECK MUSCLES EVERY 3 MONTHS, Disp: 1 each, Rfl: 3   Buprenorphine  HCl 600 MCG FILM, Place 600 mcg under the tongue in the morning and at bedtime., Disp: , Rfl:    buPROPion  (WELLBUTRIN  XL) 150 MG 24 hr tablet, Take 150 mg by mouth in the morning., Disp: , Rfl:    buPROPion  (WELLBUTRIN  XL) 300 MG 24 hr tablet, Take 300 mg by mouth in the morning., Disp: , Rfl:    busPIRone  (BUSPAR ) 15 MG tablet, Take 15 mg by mouth 3 (three) times daily., Disp: , Rfl:    Cholecalciferol  (VITAMIN D3 PO), Take 1 capsule by mouth daily., Disp: , Rfl:    clonazePAM  (KLONOPIN ) 1 MG tablet, Take 1 mg by mouth 2 (two) times daily as needed for anxiety., Disp: , Rfl:    cyanocobalamin  (,VITAMIN B-12,) 1000 MCG/ML injection, Inject 1,000 mcg into the muscle 2 (two) times a week. , Disp: , Rfl:    diclofenac  Sodium (VOLTAREN ) 1 % GEL, Apply 2 g topically 4 (four) times daily as needed  (Pain)., Disp: , Rfl:    dicyclomine  (BENTYL ) 10 MG capsule, Take 10 mg by mouth 4 (four) times daily -  before meals and at bedtime., Disp: , Rfl:    DULoxetine  (CYMBALTA ) 60 MG capsule, Take 1 capsule (60 mg total) by mouth 2 (two) times daily., Disp: 60 capsule, Rfl: 0   EPINEPHrine  0.3 mg/0.3 mL IJ SOAJ injection, Inject 0.3 mg into the muscle as needed for anaphylaxis., Disp: 1 each, Rfl: 0   famotidine  (PEPCID ) 20 MG tablet, Take 20 mg by mouth 2 (two) times daily., Disp: , Rfl:    ferrous sulfate  325 (65 FE) MG tablet, Take 1 tablet (325 mg total) by mouth daily with breakfast., Disp: 30 tablet, Rfl: 0   fluticasone  (FLONASE ) 50 MCG/ACT nasal spray, Place 1-2 sprays into both nostrils daily as needed for allergies or rhinitis., Disp: , Rfl:    gabapentin  (NEURONTIN ) 600 MG tablet, Take 900 mg by mouth 3 (three) times daily., Disp: , Rfl:    HYDROmorphone  (DILAUDID ) 4 MG tablet, Take 4 mg by mouth every 4 (four) hours. (Patient taking differently: Take 4 mg by mouth every 6 (six) hours as needed.), Disp: , Rfl:    lactulose  (CHRONULAC ) 10 GM/15ML solution, Take 45 mLs (30 g total) by mouth 3 (three) times daily., Disp: 946 mL, Rfl: 0   linaclotide  (LINZESS ) 290 MCG CAPS capsule, Take 290 mcg by mouth daily before breakfast., Disp: , Rfl:    mesalamine  (LIALDA ) 1.2 g EC tablet, Take 2.4 g by mouth 2 (two) times daily., Disp: , Rfl:    methocarbamol  (ROBAXIN ) 500 MG tablet, Take 2 tablets (1,000 mg total) by mouth every 6 (six) hours as needed (use for muscle cramps/pain). (Patient taking differently: Take 1,000 mg by mouth every 6 (six) hours as needed (for muscle cramps/pain).), Disp: 60 tablet, Rfl: 0   midodrine  (PROAMATINE ) 10 MG tablet, Take 1.5 tablets (15 mg total) by mouth 3 (three) times  daily with meals., Disp: 120 tablet, Rfl: 1   misoprostol  (CYTOTEC ) 200 MCG tablet, Take 200 mcg by mouth daily., Disp: , Rfl:    Multiple Vitamin (MULTIVITAMIN WITH MINERALS) TABS tablet, Take 1 tablet  by mouth daily with breakfast., Disp: , Rfl:    NARCAN  4 MG/0.1ML LIQD nasal spray kit, Place 1 spray into the nose daily as needed (overdose)., Disp: , Rfl:    Nutritional Supplements (NUTREN 2.0) LIQD, -Nutren 2.0 @ 52 ml/hr x 14 hours via G-tube -Provides 1500 kcals, 63g protein and 519 ml H2O -Continue daily MVI via tube -Free water : 100 ml every 4 hours, Disp: 1000 mL, Rfl: 30   nystatin  (MYCOSTATIN ) 100000 UNIT/ML suspension, Take 5 mLs by mouth See admin instructions. 5 ml's by mouth three times a day AS DIRECTED, Disp: , Rfl:    ondansetron  (ZOFRAN -ODT) 8 MG disintegrating tablet, Take 8 mg by mouth See admin instructions. Dissolve 8 mg in the mouth every eight hours, Disp: , Rfl:    oxybutynin  (DITROPAN -XL) 5 MG 24 hr tablet, Take 10 mg by mouth at bedtime., Disp: , Rfl:    pantoprazole  (PROTONIX ) 40 MG tablet, Take 1 tablet (40 mg total) by mouth 2 (two) times daily before a meal. Crush and take twice daily, Disp: 60 tablet, Rfl: 0   PRESCRIPTION MEDICATION, Take 400 mg by mouth See admin instructions. Magnesium  ES 400 mg SGC - Crush/dissolve 400 mg and take by mouth once a day, Disp: , Rfl:    QULIPTA  30 MG TABS, TAKE 1 TABLET BY MOUTH EVERY DAY (Patient taking differently: Take 30 mg by mouth daily.), Disp: 30 tablet, Rfl: 10   senna-docusate (SENOKOT-S) 8.6-50 MG tablet, Take 1 tablet by mouth 2 (two) times daily., Disp: 60 tablet, Rfl: 0   sucralfate  (CARAFATE ) 1 GM/10ML suspension, Take 10 mLs (1 g total) by mouth 4 (four) times daily., Disp: 2400 mL, Rfl: 0   SUMAtriptan  (IMITREX ) 50 MG tablet, Take 50 mg by mouth daily as needed for migraine or headache (and may repeat once in 2 hours if headache persists or recurs)., Disp: , Rfl:    SYMPROIC  0.2 MG TABS, Take 0.2 mg by mouth daily before breakfast., Disp: , Rfl:    thiamine  (VITAMIN B-1) 100 MG tablet, Take 100 mg by mouth daily at 12 noon. , Disp: , Rfl:    Vitamin D , Ergocalciferol , (DRISDOL ) 1.25 MG (50000 UNIT) CAPS capsule, Take  50,000 Units by mouth every Monday., Disp: , Rfl:   "

## 2024-11-03 LAB — MULTIPLE MYELOMA PANEL, SERUM

## 2024-11-03 LAB — SJOGREN'S SYNDROME ANTIBODS(SSA + SSB)
ENA SSA (RO) Ab: <0.2 AI (ref 0.0–0.9)
ENA SSB (LA) Ab: <0.2 AI (ref 0.0–0.9)

## 2024-11-03 LAB — VITAMIN B12: Vitamin B-12: 1879 pg/mL — ABNORMAL HIGH (ref 232–1245)

## 2024-12-06 ENCOUNTER — Ambulatory Visit: Admitting: Neurology

## 2025-04-05 ENCOUNTER — Other Ambulatory Visit (HOSPITAL_COMMUNITY)
